# Patient Record
Sex: Male | Born: 1935 | Race: White | Hispanic: No | Smoking: Former smoker
Health system: Southern US, Community
[De-identification: ages and names within clinical notes are randomized; demographics above are authoritative.]

## PROBLEM LIST (undated history)

## (undated) DIAGNOSIS — F32A Depression, unspecified: Secondary | ICD-10-CM

## (undated) DIAGNOSIS — D0362 Melanoma in situ of left upper limb, including shoulder: Secondary | ICD-10-CM

## (undated) DIAGNOSIS — K219 Gastro-esophageal reflux disease without esophagitis: Secondary | ICD-10-CM

## (undated) DIAGNOSIS — C779 Secondary and unspecified malignant neoplasm of lymph node, unspecified: Secondary | ICD-10-CM

## (undated) DIAGNOSIS — J449 Chronic obstructive pulmonary disease, unspecified: Secondary | ICD-10-CM

## (undated) DIAGNOSIS — M1990 Unspecified osteoarthritis, unspecified site: Secondary | ICD-10-CM

## (undated) DIAGNOSIS — F332 Major depressive disorder, recurrent severe without psychotic features: Secondary | ICD-10-CM

## (undated) DIAGNOSIS — H3552 Pigmentary retinal dystrophy: Secondary | ICD-10-CM

## (undated) DIAGNOSIS — F1011 Alcohol abuse, in remission: Secondary | ICD-10-CM

## (undated) DIAGNOSIS — F1120 Opioid dependence, uncomplicated: Secondary | ICD-10-CM

## (undated) DIAGNOSIS — C801 Malignant (primary) neoplasm, unspecified: Secondary | ICD-10-CM

## (undated) DIAGNOSIS — I714 Abdominal aortic aneurysm, without rupture, unspecified: Secondary | ICD-10-CM

## (undated) DIAGNOSIS — J189 Pneumonia, unspecified organism: Secondary | ICD-10-CM

## (undated) DIAGNOSIS — M456 Ankylosing spondylitis lumbar region: Secondary | ICD-10-CM

## (undated) DIAGNOSIS — M4810 Ankylosing hyperostosis [Forestier], site unspecified: Secondary | ICD-10-CM

## (undated) DIAGNOSIS — I10 Essential (primary) hypertension: Secondary | ICD-10-CM

## (undated) DIAGNOSIS — S12100A Unspecified displaced fracture of second cervical vertebra, initial encounter for closed fracture: Secondary | ICD-10-CM

## (undated) DIAGNOSIS — I509 Heart failure, unspecified: Secondary | ICD-10-CM

## (undated) DIAGNOSIS — F419 Anxiety disorder, unspecified: Secondary | ICD-10-CM

## (undated) DIAGNOSIS — Z87891 Personal history of nicotine dependence: Secondary | ICD-10-CM

## (undated) DIAGNOSIS — I5030 Unspecified diastolic (congestive) heart failure: Secondary | ICD-10-CM

## (undated) DIAGNOSIS — I499 Cardiac arrhythmia, unspecified: Secondary | ICD-10-CM

## (undated) DIAGNOSIS — R296 Repeated falls: Secondary | ICD-10-CM

## (undated) DIAGNOSIS — H409 Unspecified glaucoma: Secondary | ICD-10-CM

## (undated) DIAGNOSIS — H903 Sensorineural hearing loss, bilateral: Secondary | ICD-10-CM

## (undated) DIAGNOSIS — C439 Malignant melanoma of skin, unspecified: Secondary | ICD-10-CM

## (undated) DIAGNOSIS — M810 Age-related osteoporosis without current pathological fracture: Secondary | ICD-10-CM

## (undated) HISTORY — DX: Malignant (primary) neoplasm, unspecified: C80.1

## (undated) HISTORY — PX: CHOLECYSTECTOMY: SHX55

## (undated) HISTORY — DX: Chronic obstructive pulmonary disease, unspecified: J44.9

## (undated) HISTORY — PX: COLONOSCOPY: SHX174

## (undated) HISTORY — DX: Unspecified glaucoma: H40.9

## (undated) HISTORY — DX: Anxiety disorder, unspecified: F41.9

## (undated) HISTORY — DX: Age-related osteoporosis without current pathological fracture: M81.0

---

## 2006-06-19 HISTORY — PX: TRANSURETHRAL RESECTION OF PROSTATE: SHX73

## 2016-08-15 LAB — COMPREHENSIVE METABOLIC PANEL
ANA RESULT:: NEGATIVE
CK Total: 31
CRP: 3
Calcium, Corrected: 9.3
ERYTHROCYTE SED RATE: 12
PREALBUMIN: 25
Rheumatoid Fact: <15
Uric Acid: 4.1

## 2016-08-15 LAB — CBC AND DIFFERENTIAL
HCT: 44 (ref 41–53)
Hemoglobin: 13.6 (ref 13.5–17.5)
WBC: 6.1

## 2016-08-15 LAB — HEPATIC FUNCTION PANEL
ALT: 37 (ref 10–40)
AST: 29 (ref 14–40)
Alkaline Phosphatase: 71 (ref 25–125)
Bilirubin, Total: 0.3

## 2016-08-15 LAB — BASIC METABOLIC PANEL
BUN: 24 — AB (ref 4–21)
Creatinine: 1.3 (ref 0.6–1.3)
Glucose: 122
Potassium: 4.7 (ref 3.4–5.3)
Sodium: 142 (ref 137–147)

## 2016-08-15 LAB — VITAMIN B12: Vitamin B-12: 728

## 2016-08-15 LAB — VITAMIN D 25 HYDROXY (VIT D DEFICIENCY, FRACTURES): VIT D 25 HYDROXY: 26

## 2016-08-15 LAB — TSH: TSH: 2.34 (ref 0.41–5.90)

## 2018-05-01 ENCOUNTER — Encounter: Payer: Self-pay | Admitting: Family Medicine

## 2018-05-01 ENCOUNTER — Ambulatory Visit: Payer: Medicare Other | Admitting: Family Medicine

## 2018-05-01 VITALS — BP 142/88 | HR 67 | Temp 98.0°F | Resp 16 | Ht 70.0 in | Wt 243.8 lb

## 2018-05-01 DIAGNOSIS — Z7689 Persons encountering health services in other specified circumstances: Secondary | ICD-10-CM | POA: Diagnosis not present

## 2018-05-01 DIAGNOSIS — M159 Polyosteoarthritis, unspecified: Secondary | ICD-10-CM | POA: Insufficient documentation

## 2018-05-01 DIAGNOSIS — I10 Essential (primary) hypertension: Secondary | ICD-10-CM

## 2018-05-01 DIAGNOSIS — H3552 Pigmentary retinal dystrophy: Secondary | ICD-10-CM | POA: Insufficient documentation

## 2018-05-01 DIAGNOSIS — M4810 Ankylosing hyperostosis [Forestier], site unspecified: Secondary | ICD-10-CM | POA: Diagnosis not present

## 2018-05-01 DIAGNOSIS — M150 Primary generalized (osteo)arthritis: Secondary | ICD-10-CM | POA: Diagnosis not present

## 2018-05-01 DIAGNOSIS — K219 Gastro-esophageal reflux disease without esophagitis: Secondary | ICD-10-CM | POA: Insufficient documentation

## 2018-05-01 DIAGNOSIS — H409 Unspecified glaucoma: Secondary | ICD-10-CM

## 2018-05-01 DIAGNOSIS — T402X5A Adverse effect of other opioids, initial encounter: Secondary | ICD-10-CM

## 2018-05-01 DIAGNOSIS — G894 Chronic pain syndrome: Secondary | ICD-10-CM | POA: Insufficient documentation

## 2018-05-01 DIAGNOSIS — Z1211 Encounter for screening for malignant neoplasm of colon: Secondary | ICD-10-CM

## 2018-05-01 DIAGNOSIS — G8929 Other chronic pain: Secondary | ICD-10-CM | POA: Insufficient documentation

## 2018-05-01 DIAGNOSIS — J432 Centrilobular emphysema: Secondary | ICD-10-CM | POA: Insufficient documentation

## 2018-05-01 DIAGNOSIS — M545 Low back pain: Secondary | ICD-10-CM

## 2018-05-01 DIAGNOSIS — K5520 Angiodysplasia of colon without hemorrhage: Secondary | ICD-10-CM | POA: Insufficient documentation

## 2018-05-01 DIAGNOSIS — M25561 Pain in right knee: Secondary | ICD-10-CM

## 2018-05-01 DIAGNOSIS — M25562 Pain in left knee: Secondary | ICD-10-CM

## 2018-05-01 DIAGNOSIS — K5903 Drug induced constipation: Secondary | ICD-10-CM | POA: Insufficient documentation

## 2018-05-01 NOTE — Assessment & Plan Note (Signed)
Underlying spinal etiology contributing to cartilage loss and degenerative joint disease, primarily affecting lumbar spine, also bilateral knees Previously followed by Ortho and Pain management in [place] Referral to local [facility] to resume chronic opiate therapy if indicated / review procedural interventions as well - given high dose of opiates, I am hesitant to continue at this dose unless recommended by Pain Management or goal to transition his pain care to specialist  Continue Meloxicam 7.5mg  BID, Lyrica, SNRI Effexor

## 2018-05-01 NOTE — Assessment & Plan Note (Signed)
Secondary to chronic opiate use for pain management Improved bowel function on sennakot regimen

## 2018-05-01 NOTE — Assessment & Plan Note (Signed)
Controlled on PPI Continue Pantoprazole 40mg  BID

## 2018-05-01 NOTE — Assessment & Plan Note (Addendum)
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 Previously followed by Ortho and Pain management in [place] Referral to local [facility] to resume chronic opiate therapy if indicated / review procedural interventions as well - given high dose of opiates, I am hesitant to continue at this dose unless recommended by Pain Management or goal to transition his pain care to specialist  Continue Meloxicam 7.5mg  BID, Lyrica, SNRI Effexor  I would most likely agree to bridge his opiate therapy, possibly at slightly lower dose when he is 1 week from running out of medicine, also we may consider contacting his pain doctor in [place] to bridge him to new pain clinic as well.

## 2018-05-01 NOTE — Assessment & Plan Note (Addendum)
Mildly elevated initial BP, repeat manual check improved. - Home BP readings improved   No known complications - request records    Plan:  1. Continue current BP regimen - HCTZ 12.5mg  daily, Amlodipine 10mg  daily 2. Encourage improved lifestyle - low sodium diet, improve activity walking 3. Continue monitor BP outside office, bring readings to next visit, if persistently >140/90 or new symptoms notify office sooner 4. Follow-up within 3 months, review prior records

## 2018-05-01 NOTE — Assessment & Plan Note (Addendum)
Stable without known recurrence Previous cautery procedure from GI >5 yr ago On PPI  Referral to local [facility] for colonoscopy for screening, may discuss this concern as well Reportedly on Lyrica for this AVM

## 2018-05-01 NOTE — Assessment & Plan Note (Signed)
See A&P 

## 2018-05-01 NOTE — Assessment & Plan Note (Signed)
See A&P  Additionally in past had ortho steroid knee injections with 3 month improvement, he was advised to hold off on knee replacement in past 6 months back in [place].  May return for knee injections PRN

## 2018-05-01 NOTE — Assessment & Plan Note (Signed)
Stable currently w/o exacerbation Continues on Anoro daily maintenance  Also has history of recurrent pneumonia, he has been placed on chronic doxycycline 100mg  BID x 10 days every month for past 8 months and has reduced his pneumonia episodes - will refill when ready, offered pulmonology referral he declines at this time.

## 2018-05-01 NOTE — Progress Notes (Addendum)
Subjective:    Patient ID: Aaron Gibbs, male    DOB: Nov 14, 1934, 83 y.o.   MRN: [id number]  Aaron Gibbs is a 83 y.o. male presenting on 05/01/2018 for [place] (moved from [place]); Pain; Arthritis; and COPD  Moved from [place] about 2 weeks ago. Previous PCP Dr Rhona Raider IM ([place]).  HPI  Chronic Pain Syndrome / Low Back Pain / DISH Arthritis - Opioid Induced Constipation - He was previously diagnosed with DISH for arthritis in back. He has tried and failed multiple medications including NSAIDs, Celebrex, Gabapentin. He did spinal epidural injections in past with limited relief. He has been taking opiate pain medication for >15 years, initially started in [place] after failed procedures started on opiates, he then moved to [place] and continued opiates, was seen by [facility] (Dr Laurice Record). He has been on current regimen for approximately 7 years, with long acting opiate in past with Oxycontin, and now switched to Morphine Sulfate ER 15mg  twice daily, and Oxycodone 10/325 TID regularly most days. - Also has knee pain osteoarthritis in both knees with reduced cartilage. He has seen [place] and they considered Knee replacement, and deferred decision at that time. Within past 6 months. - He reviews background history in younger years he did long hours of labor intensive work, and worked as Chief Financial Officer, also had one back injury fall from horse age 72s. He has been retired 28 years. - He currently has a 2 week supply left of his pain medicine,  - Additional treatments - taking Meloxicam 7.5mg  BID, Lyrica reportedly for AVM in bowel, but should also be helping pain, also on Effexor for mood. - he takes Sennakot for bowel regimen to treat constipation - he is requesting referral to local Pain Management provider now. He attempted to call and schedule on his own but was told he needs a referral.  Chronic Depression, recurrent  - in remission He has history of depression for years. He initially started with Effexor, and has been on it long term, has done well with it. - Currently taking Effexor 75mg  daily, doing well without concerns now.  CHRONIC HTN: Reports checks BP occasional. Usually elevated in doctors office. Current Meds - HCTZ 12.5mg  daily, Amlodipine 10mg  daily   Reports good compliance, took meds today. Tolerating well, w/o complaints. Lifestyle: - Diet: no particular diet, not following low sodium - Exercise: limited exercise, rarely walking Denies CP, dyspnea, HA, edema, dizziness / lightheadedness  AVM colon / history of GIB Previously followed by GI specialist at [facility]. They did procedure with colonoscopy and did cautery of AVM due to bleeding. - he states was put on Lyrica for this issue. Also on pantoprazole.  COPD Long term problem. He has rare flare up. Using Anoro inhaler regularly History of chronic recurrent pneumonia in past. Previously Followed by Pulmonology - they advised him to take Doxycycline 100mg  BID for for 10 days out of every month, for past 8 months. Usually gets 3 month supply of rx.  Bilateral Glaucoma / Retinitis Pigmentosa Requesting referral to Local Ophthalmology to establish care locally, continues on eye drops with Betimol 0.25%.  Preferred pharmacy [facility] - maintenance  Health Maintenance: Request PCP record for updates on health maintenance  Last colonoscopy done 6 years ago in [place]. Reportedly no polyps. He is not sure when they told him to return. He is requesting one more colonoscopy done for screening purposes. Request referral now.  Depression screen PHQ 2/9 05/01/2018  Decreased Interest 0  Down, Depressed, Hopeless 0  PHQ - 2 Score 0    Past Medical History:  Diagnosis Date  . Anxiety   . Cancer ([place])   . COPD (chronic obstructive pulmonary disease) ([place])   . Glaucoma   . Osteoporosis    osteopenia   Past  Surgical History:  Procedure Laterality Date  . CHOLECYSTECTOMY     Social History   Socioeconomic History  . Marital status: Divorced    Spouse name: Not on file  . Number of children: Not on file  . Years of education: Not on file  . Highest education level: Not on file  Occupational History  . Not on file  Social Needs  . Financial resource strain: Not on file  . Food insecurity:    Worry: Not on file    Inability: Not on file  . Transportation needs:    Medical: Not on file    Non-medical: Not on file  Tobacco Use  . Smoking status: Former Smoker    Years: 15.00  . Smokeless tobacco: Current User    Types: Chew  Substance and Sexual Activity  . Alcohol use: Not Currently    Comment: past  . Drug use: Never  . Sexual activity: Not on file  Lifestyle  . Physical activity:    Days per week: Not on file    Minutes per session: Not on file  . Stress: Not on file  Relationships  . Social connections:    Talks on phone: Not on file    Gets together: Not on file    Attends religious service: Not on file    Active member of club or organization: Not on file    Attends meetings of clubs or organizations: Not on file    Relationship status: Not on file  . Intimate partner violence:    Fear of current or ex partner: Not on file    Emotionally abused: Not on file    Physically abused: Not on file    Forced sexual activity: Not on file  Other Topics Concern  . Not on file  Social History Narrative  . Not on file   Family History  Problem Relation Age of Onset  . Depression Mother   . Heart disease Father    Current Outpatient Medications on File Prior to Visit  Medication Sig  . amLODipine (NORVASC) 10 MG tablet Take 10 mg by mouth daily.  Marland Kitchen aspirin EC 81 MG tablet Take 81 mg by mouth daily.  Marland Kitchen doxycycline (VIBRAMYCIN) 100 MG capsule Take 100 mg by mouth 2 (two) times daily. For 10 days, every month, chronic pneumonia prophylaxis  . fenofibrate (TRICOR) 145 MG  tablet Take 145 mg by mouth daily.  . ferrous sulfate 325 (65 FE) MG tablet Take 325 mg by mouth daily with breakfast.  . hydrochlorothiazide (MICROZIDE) 12.5 MG capsule Take 12.5 mg by mouth daily.  . meloxicam (MOBIC) 7.5 MG tablet Take 7.5 mg by mouth 2 (two) times daily.  . Multiple Vitamins-Minerals (MULTIVITAL PO) Take by mouth.  . oxyCODONE-acetaminophen (PERCOCET) 10-325 MG tablet Take 1 tablet by mouth 3 (three) times daily.  . pantoprazole (PROTONIX) 40 MG tablet Take 40 mg by mouth 2 (two) times daily.  . pregabalin (LYRICA) 100 MG capsule Take 100 mg by mouth 4 (four) times daily.  Marland Kitchen senna (SENOKOT) 8.6 MG tablet Take 1 tablet by mouth daily.  . timolol (BETIMOL) 0.25 %  ophthalmic solution 1-2 drops 2 (two) times daily.  Marland Kitchen umeclidinium-vilanterol (ANORO ELLIPTA) 62.5-25 MCG/INH AEPB Inhale 1 puff into the lungs daily.  Marland Kitchen venlafaxine (EFFEXOR) 75 MG tablet Take 75 mg by mouth 2 (two) times daily.  Marland Kitchen morphine (MS CONTIN) 15 MG 12 hr tablet Take 1 tablet (15 mg total) by mouth every 12 (twelve) hours.   No current facility-administered medications on file prior to visit.     Review of Systems Per HPI unless specifically indicated above     Objective:    BP (!) 142/88 (BP Location: Left Arm, Cuff Size: Normal)   Pulse 67   Temp 98 F (36.7 C) (Oral)   Resp 16   Ht 5\' 10"  (1.778 m)   Wt 243 lb 12.8 oz (110.6 kg)   SpO2 98%   BMI 34.98 kg/m   Wt Readings from Last 3 Encounters:  05/01/18 243 lb 12.8 oz (110.6 kg)    Physical Exam  Constitutional: He is oriented to person, place, and time. He appears well-developed and well-nourished. No distress.  Chronically ill but currently well appearing 83 yr old male, comfortable, cooperative  HENT:  Head: Normocephalic and atraumatic.  Mouth/Throat: Oropharynx is clear and moist.  Eyes: Conjunctivae are normal. Right eye exhibits no discharge. Left eye exhibits no discharge.  Neck: Normal range of motion. Neck supple.    Cardiovascular: Normal rate, regular rhythm, normal heart sounds and intact distal pulses.  No murmur heard. Pulmonary/Chest: Effort normal and breath sounds normal. No respiratory distress. He has no wheezes. He has no rales.  Musculoskeletal: He exhibits no edema.  Using cane for ambulation.  Able to stand from seated position.  Significant increased thoracic kyphosis on exam.  Generalized muscle hypertonicity bilateral paraspinal back muscles thoracic into lumbar.  Neurological: He is alert and oriented to person, place, and time.  Skin: Skin is warm and dry. No rash noted. He is not diaphoretic. No erythema.  Psychiatric: He has a normal mood and affect. His behavior is normal.  Well groomed, good eye contact, normal speech and thoughts  Nursing note and vitals reviewed.  No results found for this or any previous visit.    Assessment & Plan:   Problem List Items Addressed This Visit    AVM (arteriovenous malformation) of colon    Stable without known recurrence Previous cautery procedure from GI >5 yr ago On PPI  Referral to local [facility] for colonoscopy for screening, may discuss this concern as well Reportedly on Lyrica for this AVM      Relevant Medications   amLODipine (NORVASC) 10 MG tablet   hydrochlorothiazide (MICROZIDE) 12.5 MG capsule   fenofibrate (TRICOR) 145 MG tablet   aspirin EC 81 MG tablet   Other Relevant Orders   Ambulatory referral to Gastroenterology   Bilateral chronic knee pain    See A&P  Additionally in past had ortho steroid knee injections with 3 month improvement, he was advised to hold off on knee replacement in past 6 months back in [place].  May return for knee injections PRN      Relevant Medications   meloxicam (MOBIC) 7.5 MG tablet   oxyCODONE-acetaminophen (PERCOCET) 10-325 MG tablet   pregabalin (LYRICA) 100 MG capsule   venlafaxine (EFFEXOR) 75 MG tablet   aspirin EC 81 MG tablet   morphine (MS CONTIN) 15 MG 12 hr tablet    Other Relevant Orders   Ambulatory referral to Pain Clinic   Centrilobular emphysema ([place])    Stable currently  w/o exacerbation Continues on Anoro daily maintenance  Also has history of recurrent pneumonia, he has been placed on chronic doxycycline 100mg  BID x 10 days every month for past 8 months and has reduced his pneumonia episodes - will refill when ready, offered pulmonology referral he declines at this time.      Relevant Medications   umeclidinium-vilanterol (ANORO ELLIPTA) 62.5-25 MCG/INH AEPB   Chronic low back pain   Relevant Medications   meloxicam (MOBIC) 7.5 MG tablet   oxyCODONE-acetaminophen (PERCOCET) 10-325 MG tablet   aspirin EC 81 MG tablet   morphine (MS CONTIN) 15 MG 12 hr tablet   Other Relevant Orders   Ambulatory referral to Pain Clinic   Chronic pain syndrome - Primary    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 Previously followed by Ortho and Pain management in [place] Referral to local [place] [facility] to resume chronic opiate therapy if indicated / review procedural interventions as well - given high dose of opiates, I am hesitant to continue at this dose unless recommended by Pain Management or goal to transition his pain care to specialist  Continue Meloxicam 7.5mg  BID, Lyrica, SNRI Effexor  I would most likely agree to bridge his opiate therapy, possibly at slightly lower dose when he is 1 week from running out of medicine, also we may consider contacting his pain doctor in [place] to bridge him to new pain clinic as well.      Relevant Medications   morphine (MS CONTIN) 15 MG 12 hr tablet   Other Relevant Orders   Ambulatory referral to Pain Clinic   DISH (diffuse idiopathic skeletal hyperostosis)    Underlying spinal etiology contributing to cartilage loss and degenerative joint disease, primarily affecting lumbar spine, also bilateral knees Previously followed by Ortho and Pain  management in [place] Referral to local [place] [facility] to resume chronic opiate therapy if indicated / review procedural interventions as well - given high dose of opiates, I am hesitant to continue at this dose unless recommended by Pain Management or goal to transition his pain care to specialist  Continue Meloxicam 7.5mg  BID, Lyrica, SNRI Effexor      Relevant Medications   morphine (MS CONTIN) 15 MG 12 hr tablet   Other Relevant Orders   Ambulatory referral to Pain Clinic   Essential hypertension    Mildly elevated initial BP, repeat manual check improved. - Home BP readings improved   No known complications - request records    Plan:  1. Continue current BP regimen - HCTZ 12.5mg  daily, Amlodipine 10mg  daily 2. Encourage improved lifestyle - low sodium diet, improve activity walking 3. Continue monitor BP outside office, bring readings to next visit, if persistently >140/90 or new symptoms notify office sooner 4. Follow-up within 3 months, review prior records       Relevant Medications   amLODipine (NORVASC) 10 MG tablet   hydrochlorothiazide (MICROZIDE) 12.5 MG capsule   fenofibrate (TRICOR) 145 MG tablet   aspirin EC 81 MG tablet   GERD (gastroesophageal reflux disease)    Controlled on PPI Continue Pantoprazole 40mg  BID      Relevant Medications   pantoprazole (PROTONIX) 40 MG tablet   senna (SENOKOT) 8.6 MG tablet   Glaucoma of both eyes   Relevant Medications   timolol (BETIMOL) 0.25 % ophthalmic solution   Other Relevant Orders   Ambulatory referral to Ophthalmology   Osteoarthritis of multiple joints  See A&P      Relevant Medications   meloxicam (MOBIC) 7.5 MG tablet   oxyCODONE-acetaminophen (PERCOCET) 10-325 MG tablet   aspirin EC 81 MG tablet   morphine (MS CONTIN) 15 MG 12 hr tablet   Other Relevant Orders   Ambulatory referral to Pain Clinic   Retinitis pigmentosa of both eyes   Relevant Orders   Ambulatory  referral to Ophthalmology   Therapeutic opioid-induced constipation (OIC)    Secondary to chronic opiate use for pain management Improved bowel function on sennakot regimen      Relevant Orders   Ambulatory referral to Gastroenterology    Other Visit Diagnoses    Encounter to establish care with new doctor       Screen for colon cancer       Relevant Orders   Ambulatory referral to Gastroenterology      No orders of the defined types were placed in this encounter.  Orders Placed This Encounter  Procedures  . Ambulatory referral to Ophthalmology    Referral Priority:   Routine    Referral Type:   Consultation    Referral Reason:   Specialty Services Required    Referred to Provider:   Estill Cotta, MD    Requested Specialty:   Ophthalmology    Number of Visits Requested:   1  . Ambulatory referral to Gastroenterology    Referral Priority:   Routine    Referral Type:   Consultation    Referral Reason:   Specialty Services Required    Number of Visits Requested:   1  . Ambulatory referral to Pain Clinic    Referral Priority:   Routine    Referral Type:   Consultation    Referral Reason:   Specialty Services Required    Requested Specialty:   Pain Medicine    Number of Visits Requested:   1    Follow up plan: Return in about 3 months (around 08/01/2018) for Follow-up chronic pain / referrals (pain, ophtho, GI), med refills, HTN.  [name], [facility] 05/01/2018, 5:49 PM

## 2018-05-01 NOTE — Patient Instructions (Addendum)
Thank you for coming to the office today.  Referral to Pain Management - stay tuned for apt - if you have not heard by next week Monday, then call them to check status of this, and notify me if you are about to run out of pain medicine at least 1 week before.  [facility] Address: [address], [place], [place] [address] Phone: [phone]  -------------------------------------------------------  Referral sent to   [facility] [address] [place], [place] [address] Phone: [phone] [email or web address]  Stay tuned for appointment - they may need to get records as well  ---- Stay tuned for colonoscopy  [facility]) [address] [place], [place] [address] Phone: [phone]  Please schedule a Follow-up Appointment to: Return in about 3 months (around 08/01/2018) for Follow-up chronic pain / referrals (pain, ophtho, GI), med refills, HTN.  If you have any other questions or concerns, please feel free to call the office or send a message through [place]. You may also schedule an earlier appointment if necessary.  Additionally, you may be receiving a survey about your experience at our office within a few days to 1 week by e-mail or mail. We value your feedback.  [name], DO [place]

## 2018-05-06 ENCOUNTER — Encounter: Payer: Self-pay | Admitting: *Deleted

## 2018-05-09 ENCOUNTER — Emergency Department
Admission: EM | Admit: 2018-05-09 | Discharge: 2018-05-09 | Disposition: A | Discharge status: Home / Self Care | Payer: Medicare Other | Attending: Emergency Medicine | Admitting: Emergency Medicine

## 2018-05-09 ENCOUNTER — Other Ambulatory Visit: Payer: Self-pay

## 2018-05-09 ENCOUNTER — Emergency Department: Payer: Medicare Other

## 2018-05-09 DIAGNOSIS — Z7982 Long term (current) use of aspirin: Secondary | ICD-10-CM | POA: Diagnosis not present

## 2018-05-09 DIAGNOSIS — R079 Chest pain, unspecified: Secondary | ICD-10-CM | POA: Insufficient documentation

## 2018-05-09 DIAGNOSIS — R109 Unspecified abdominal pain: Secondary | ICD-10-CM

## 2018-05-09 DIAGNOSIS — F17220 Nicotine dependence, chewing tobacco, uncomplicated: Secondary | ICD-10-CM | POA: Insufficient documentation

## 2018-05-09 DIAGNOSIS — Z79899 Other long term (current) drug therapy: Secondary | ICD-10-CM | POA: Diagnosis not present

## 2018-05-09 DIAGNOSIS — K5900 Constipation, unspecified: Secondary | ICD-10-CM | POA: Insufficient documentation

## 2018-05-09 DIAGNOSIS — J449 Chronic obstructive pulmonary disease, unspecified: Secondary | ICD-10-CM | POA: Diagnosis not present

## 2018-05-09 DIAGNOSIS — R1084 Generalized abdominal pain: Secondary | ICD-10-CM | POA: Diagnosis present

## 2018-05-09 LAB — COMPREHENSIVE METABOLIC PANEL
ALK PHOS: 45 U/L (ref 38–126)
ALT: 19 U/L (ref 0–44)
AST: 23 U/L (ref 15–41)
Albumin: 4 g/dL (ref 3.5–5.0)
Anion gap: 7 (ref 5–15)
BILIRUBIN TOTAL: 0.5 mg/dL (ref 0.3–1.2)
BUN: 21 mg/dL (ref 8–23)
CALCIUM: 9.4 mg/dL (ref 8.9–10.3)
CO2: 27 mmol/L (ref 22–32)
CREATININE: 0.83 mg/dL (ref 0.61–1.24)
Chloride: 107 mmol/L (ref 98–111)
GFR calc Af Amer: >60 mL/min (ref >60)
Glucose, Bld: 125 mg/dL — ABNORMAL HIGH (ref 70–99)
Potassium: 4 mmol/L (ref 3.5–5.1)
Sodium: 141 mmol/L (ref 135–145)
TOTAL PROTEIN: 6.9 g/dL (ref 6.5–8.1)

## 2018-05-09 LAB — CBC
HCT: 45.8 % (ref 39.0–52.0)
HEMOGLOBIN: 14.7 g/dL (ref 13.0–17.0)
MCH: 29.1 pg (ref 26.0–34.0)
MCHC: 32.1 g/dL (ref 30.0–36.0)
MCV: 90.7 fL (ref 80.0–100.0)
PLATELETS: 200 10*3/uL (ref 150–400)
RBC: 5.05 MIL/uL (ref 4.22–5.81)
RDW: 14.9 % (ref 11.5–15.5)
WBC: 8.2 10*3/uL (ref 4.0–10.5)
nRBC: 0 % (ref 0.0–0.2)

## 2018-05-09 LAB — LIPASE, BLOOD: Lipase: 22 U/L (ref 11–51)

## 2018-05-09 LAB — TROPONIN I

## 2018-05-09 MED ORDER — KETOROLAC TROMETHAMINE 60 MG/2ML IM SOLN
30.0000 mg | Freq: Once | INTRAMUSCULAR | Status: AC
  Administered 2018-05-09: 30 mg via INTRAMUSCULAR
  Filled 2018-05-09: qty 2

## 2018-05-09 MED ORDER — POLYETHYLENE GLYCOL 3350 17 GM/SCOOP PO POWD: 17.0000 g | Freq: Every day | ORAL | 0 refills | Status: DC | PRN

## 2018-05-09 NOTE — ED Notes (Signed)
Holding pt for 20 min for medication.

## 2018-05-09 NOTE — Discharge Instructions (Signed)
As we discussed please take MiraLAX 17 g 3 times daily with a large glass of water until you have several good bowel movements and then you may back off to 1 times daily.  Please continue to take your Colace and Senokot on a daily basis.  Return to the emergency department for any worsening or increase of abdominal pain, any worsening chest pain trouble breathing, or any other symptom personally concerning to yourself.  Otherwise please follow-up with your primary care doctor tomorrow for recheck/reevaluation.

## 2018-05-09 NOTE — ED Triage Notes (Addendum)
Pt comes via POV from home with c/o [facility] and abdominal pain. Pt states this started this am. Pt states chills and generalized abdominal pain. Pt states 8/10 pain at this time. Pt denies any cough, and nausea. Family reports 2 episodes of vomiting.  Pt is alert and oriented. Pt appears in NAD at this time

## 2018-05-09 NOTE — ED Provider Notes (Signed)
[facility] Emergency Department Provider Note  Time seen: 9:37 PM  I have reviewed the triage vital signs and the nursing notes.   HISTORY  Chief Complaint Shortness of Breath and Abdominal Pain    HPI Aaron Gibbs is a 83 y.o. male with a past medical history anxiety, cancer, COPD, hypertension, presents to the emergency department for abdominal pain as well as chest pain.  According to the patient since this morning he has been experiencing abdominal pain, points to his mid abdomen when asked where it hurts.  States he is occasionally had chest discomfort as well points to his epigastrium and some mild shortness of breath today.  States nausea but denies any vomiting.  Denies any diaphoresis.  There is a normal bowel movement yesterday.  Daughter states patient has a strong history of constipation and has required coming to the ER in the past for abdominal pain due to constipation.  Denies any fever at home.  No dysuria or hematuria.   Past Medical History:  Diagnosis Date  . Anxiety   . Cancer ([place])   . COPD (chronic obstructive pulmonary disease) ([place])   . Glaucoma   . Osteoporosis    osteopenia    Patient Active Problem List   Diagnosis Date Noted  . Chronic pain syndrome 05/01/2018  . DISH (diffuse idiopathic skeletal hyperostosis) 05/01/2018  . Osteoarthritis of multiple joints 05/01/2018  . Bilateral chronic knee pain 05/01/2018  . Retinitis pigmentosa of both eyes 05/01/2018  . Glaucoma of both eyes 05/01/2018  . Essential hypertension 05/01/2018  . Chronic low back pain 05/01/2018  . GERD (gastroesophageal reflux disease) 05/01/2018  . 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 Therapeutic opioid-induced constipation (OIC) 05/01/2018  . Centrilobular emphysema ([place]) 05/01/2018    Past Surgical History:  Procedure Laterality Date  . CHOLECYSTECTOMY      Prior to Admission medications   Medication Sig Start Date End  Date Taking? Authorizing Provider  amLODipine (NORVASC) 10 MG tablet Take 10 mg by mouth daily.    [provider]  aspirin EC 81 MG tablet Take 81 mg by mouth daily.    [provider]  doxycycline (VIBRAMYCIN) 100 MG capsule Take 100 mg by mouth 2 (two) times daily. For 10 days, every month, chronic pneumonia prophylaxis    [provider]  fenofibrate (TRICOR) 145 MG tablet Take 145 mg by mouth daily.    [provider]  ferrous sulfate 325 (65 FE) MG tablet Take 325 mg by mouth daily with breakfast.    [provider]  hydrochlorothiazide (MICROZIDE) 12.5 MG capsule Take 12.5 mg by mouth daily.    [provider]  meloxicam (MOBIC) 7.5 MG tablet Take 7.5 mg by mouth 2 (two) times daily.    [provider]  morphine (MS CONTIN) 15 MG 12 hr tablet Take 1 tablet (15 mg total) by mouth every 12 (twelve) hours. 05/01/18   Karamalegos, Devonne Doughty, DO  Multiple Vitamins-Minerals (MULTIVITAL PO) Take by mouth.    [provider]  oxyCODONE-acetaminophen (PERCOCET) 10-325 MG tablet Take 1 tablet by mouth 3 (three) times daily.    [provider]  pantoprazole (PROTONIX) 40 MG tablet Take 40 mg by mouth 2 (two) times daily.    [provider]  pregabalin (LYRICA) 100 MG capsule Take 100 mg by mouth 4 (four) times daily.    [provider]  senna (SENOKOT) 8.6 MG tablet Take 1 tablet by mouth daily.  [provider]  timolol (BETIMOL) 0.25 % ophthalmic solution 1-2 drops 2 (two) times daily.    [provider]  umeclidinium-vilanterol (ANORO ELLIPTA) 62.5-25 MCG/INH AEPB Inhale 1 puff into the lungs daily.    [provider]  venlafaxine (EFFEXOR) 75 MG tablet Take 75 mg by mouth 2 (two) times daily.    [provider]    Allergies  Allergen Reactions  . Azithromycin Shortness Of Breath    Family History  Problem Relation Age of Onset  . Depression Mother   .  Heart disease Father     Social History Social History   Tobacco Use  . Smoking status: Former Smoker    Years: 15.00  . Smokeless tobacco: Current User    Types: Chew  Substance Use Topics  . Alcohol use: Not Currently    Comment: past  . Drug use: Never    Review of Systems Constitutional: Negative for fever. Cardiovascular: Negative for chest pain. Respiratory: Negative for shortness of breath. Gastrointestinal: Positive for mid abdominal pain.  Positive for nausea negative for vomiting.  Normal bowel movement yesterday. Genitourinary: Negative for urinary compaints Musculoskeletal: Negative for musculoskeletal complaints Skin: Negative for skin complaints  Neurological: Negative for headache All other ROS negative  ____________________________________________   PHYSICAL EXAM:  VITAL SIGNS: ED Triage Vitals  Enc Vitals Group     BP 05/09/18 1734 (!) 142/72     Pulse Rate 05/09/18 1734 81     Resp 05/09/18 1734 19     Temp 05/09/18 1734 98.2 F (36.8 C)     Temp src --      SpO2 05/09/18 1734 95 %     Weight 05/09/18 1731 243 lb 12.8 oz (110.6 kg)     Height 05/09/18 1731 5\' 10"  (1.778 m)     Head Circumference --      Peak Flow --      Pain Score 05/09/18 1731 8     Pain Loc --      Pain Edu? --      Excl. in [place]? --    Constitutional: Alert and oriented. Well appearing and in no distress. Eyes: Normal exam ENT   Head: Normocephalic and atraumatic.   Mouth/Throat: Mucous membranes are moist. Cardiovascular: Normal rate, regular rhythm. No murmur Respiratory: Normal respiratory effort without tachypnea nor retractions. Breath sounds are clear Gastrointestinal: Soft, obese, mild periumbilical and left lower quadrant tenderness, no rebound guarding or distention. Musculoskeletal: Nontender with normal range of motion in all extremities.  Neurologic:  Normal speech and language. No gross focal neurologic deficits Skin:  Skin is warm, dry and intact.   Psychiatric: Mood and affect are normal.  ____________________________________________    EKG  EKG reviewed and interpreted by myself shows a normal sinus rhythm 84 bpm with a narrow QRS, normal axis, normal intervals, no concerning ST changes.  ____________________________________________    RADIOLOGY  Chest x-ray negative  ____________________________________________   INITIAL IMPRESSION / ASSESSMENT AND PLAN / ED COURSE  Pertinent labs & imaging results that were available during my care of the patient were reviewed by me and considered in my medical decision making (see chart for details).  Patient presents to the emergency department for abdominal pain and chest pain beginning this morning.  The chest pain appears to be more epigastric abdominal pain.  Daughter states he has presented very similarly in the past with constipation although the patient states he had a bowel movement yesterday.  Differential is quite broad  would include infectious etiology, metabolic or electrolyte abnormality, ACS, constipation, urinary tract infection.  Patient's labs are largely within normal limits, urinalysis pending.  Reassuringly troponin is negative and white blood cell count is negative.  We will obtain CT imaging of the abdomen as a precaution.  EKG shows no concerning findings at this time a chest x-ray is clear.  Patient's work-up is essentially negative.  CT scan does show moderate constipation.  Patient takes chronic opioid treatments, very likely causing his constipation.  Already takes Colace and Senokot daily.  I discussed with the patient and daughter starting MiraLAX 3 times daily until the patient has several good bowel movements on a backing off to 1 times daily.  They are agreeable to this plan of care.  Patient was not able to provide a urinalysis sample although his kidney function is normal, patient states he is not having any urinary symptoms and does not wish to wait for a  urine sample.  CT scan did show a AAA, which I believe is completely incidental.  I discussed its over the patient and he is aware of the AAA, nonetheless we have recommended repeat imaging in 1 year.  ____________________________________________   FINAL CLINICAL IMPRESSION(S) / ED DIAGNOSES  Abdominal pain Chest pain Constipation   Harvest Dark, MD 05/09/18 2245

## 2018-05-10 ENCOUNTER — Encounter: Payer: Self-pay | Admitting: Emergency Medicine

## 2018-05-10 ENCOUNTER — Emergency Department: Payer: Medicare Other

## 2018-05-10 ENCOUNTER — Other Ambulatory Visit: Payer: Self-pay

## 2018-05-10 ENCOUNTER — Emergency Department
Admission: EM | Admit: 2018-05-10 | Discharge: 2018-05-13 | Disposition: A | Discharge status: Other Acute Inpatient Hospital | Payer: Medicare Other | Attending: Emergency Medicine | Admitting: Emergency Medicine

## 2018-05-10 DIAGNOSIS — H3552 Pigmentary retinal dystrophy: Secondary | ICD-10-CM

## 2018-05-10 DIAGNOSIS — R4182 Altered mental status, unspecified: Secondary | ICD-10-CM | POA: Diagnosis not present

## 2018-05-10 DIAGNOSIS — Z7982 Long term (current) use of aspirin: Secondary | ICD-10-CM | POA: Insufficient documentation

## 2018-05-10 DIAGNOSIS — Z87891 Personal history of nicotine dependence: Secondary | ICD-10-CM | POA: Insufficient documentation

## 2018-05-10 DIAGNOSIS — Z046 Encounter for general psychiatric examination, requested by authority: Secondary | ICD-10-CM | POA: Diagnosis not present

## 2018-05-10 DIAGNOSIS — T1491XA Suicide attempt, initial encounter: Secondary | ICD-10-CM

## 2018-05-10 DIAGNOSIS — R1084 Generalized abdominal pain: Secondary | ICD-10-CM | POA: Insufficient documentation

## 2018-05-10 DIAGNOSIS — R45851 Suicidal ideations: Secondary | ICD-10-CM | POA: Diagnosis not present

## 2018-05-10 DIAGNOSIS — F322 Major depressive disorder, single episode, severe without psychotic features: Secondary | ICD-10-CM

## 2018-05-10 DIAGNOSIS — Z79899 Other long term (current) drug therapy: Secondary | ICD-10-CM | POA: Insufficient documentation

## 2018-05-10 DIAGNOSIS — J449 Chronic obstructive pulmonary disease, unspecified: Secondary | ICD-10-CM | POA: Diagnosis not present

## 2018-05-10 DIAGNOSIS — Z8679 Personal history of other diseases of the circulatory system: Secondary | ICD-10-CM

## 2018-05-10 DIAGNOSIS — I714 Abdominal aortic aneurysm, without rupture: Secondary | ICD-10-CM | POA: Diagnosis not present

## 2018-05-10 DIAGNOSIS — F329 Major depressive disorder, single episode, unspecified: Secondary | ICD-10-CM | POA: Insufficient documentation

## 2018-05-10 DIAGNOSIS — G894 Chronic pain syndrome: Secondary | ICD-10-CM | POA: Diagnosis present

## 2018-05-10 DIAGNOSIS — H547 Unspecified visual loss: Secondary | ICD-10-CM

## 2018-05-10 DIAGNOSIS — I10 Essential (primary) hypertension: Secondary | ICD-10-CM | POA: Insufficient documentation

## 2018-05-10 LAB — COMPREHENSIVE METABOLIC PANEL
ALBUMIN: 4.2 g/dL (ref 3.5–5.0)
ALT: 20 U/L (ref 0–44)
ANION GAP: 11 (ref 5–15)
AST: 25 U/L (ref 15–41)
Alkaline Phosphatase: 46 U/L (ref 38–126)
BILIRUBIN TOTAL: 0.8 mg/dL (ref 0.3–1.2)
BUN: 23 mg/dL (ref 8–23)
CO2: 26 mmol/L (ref 22–32)
Calcium: 9.8 mg/dL (ref 8.9–10.3)
Chloride: 107 mmol/L (ref 98–111)
Creatinine, Ser: 0.98 mg/dL (ref 0.61–1.24)
GFR calc non Af Amer: >60 mL/min (ref >60)
Glucose, Bld: 133 mg/dL — ABNORMAL HIGH (ref 70–99)
POTASSIUM: 3.3 mmol/L — AB (ref 3.5–5.1)
Sodium: 144 mmol/L (ref 135–145)
TOTAL PROTEIN: 7.2 g/dL (ref 6.5–8.1)

## 2018-05-10 LAB — CBC WITH DIFFERENTIAL/PLATELET
Abs Immature Granulocytes: 0.03 10*3/uL (ref 0.00–0.07)
BASOS ABS: 0 10*3/uL (ref 0.0–0.1)
Basophils Relative: 1 %
EOS ABS: 0.4 10*3/uL (ref 0.0–0.5)
EOS PCT: 5 %
HCT: 46.3 % (ref 39.0–52.0)
Hemoglobin: 15 g/dL (ref 13.0–17.0)
Immature Granulocytes: 0 %
Lymphocytes Relative: 14 %
Lymphs Abs: 1.1 10*3/uL (ref 0.7–4.0)
MCH: 29.1 pg (ref 26.0–34.0)
MCHC: 32.4 g/dL (ref 30.0–36.0)
MCV: 89.9 fL (ref 80.0–100.0)
Monocytes Absolute: 0.8 10*3/uL (ref 0.1–1.0)
Monocytes Relative: 10 %
NRBC: 0 % (ref 0.0–0.2)
Neutro Abs: 5.5 10*3/uL (ref 1.7–7.7)
Neutrophils Relative %: 70 %
Platelets: 199 10*3/uL (ref 150–400)
RBC: 5.15 MIL/uL (ref 4.22–5.81)
RDW: 14.9 % (ref 11.5–15.5)
WBC: 7.9 10*3/uL (ref 4.0–10.5)

## 2018-05-10 LAB — TROPONIN I

## 2018-05-10 LAB — ACETAMINOPHEN LEVEL: Acetaminophen (Tylenol), Serum: <10 ug/mL — ABNORMAL LOW (ref 10–30)

## 2018-05-10 LAB — LACTIC ACID, PLASMA: Lactic Acid, Venous: 1.2 mmol/L (ref 0.5–1.9)

## 2018-05-10 LAB — ETHANOL

## 2018-05-10 LAB — LIPASE, BLOOD: LIPASE: 19 U/L (ref 11–51)

## 2018-05-10 LAB — SALICYLATE LEVEL

## 2018-05-10 MED ORDER — OXYCODONE-ACETAMINOPHEN 10-325 MG PO TABS: 1.0000 | ORAL_TABLET | Freq: Three times a day (TID) | ORAL | Status: DC

## 2018-05-10 MED ORDER — TIMOLOL MALEATE 0.25 % OP SOLN
1.0000 [drp] | Freq: Two times a day (BID) | OPHTHALMIC | Status: DC
  Administered 2018-05-10 – 2018-05-13 (×6): 1 [drp] via OPHTHALMIC
  Filled 2018-05-10 (×2): qty 5

## 2018-05-10 MED ORDER — DOXYCYCLINE HYCLATE 100 MG PO TABS
100.0000 mg | ORAL_TABLET | Freq: Two times a day (BID) | ORAL | Status: DC
  Administered 2018-05-10 – 2018-05-13 (×7): 100 mg via ORAL
  Filled 2018-05-10 (×7): qty 1

## 2018-05-10 MED ORDER — SENNA 8.6 MG PO TABS
1.0000 | ORAL_TABLET | Freq: Every day | ORAL | Status: DC
  Administered 2018-05-10 – 2018-05-13 (×4): 8.6 mg via ORAL
  Filled 2018-05-10 (×4): qty 1

## 2018-05-10 MED ORDER — PANTOPRAZOLE SODIUM 40 MG PO TBEC
40.0000 mg | DELAYED_RELEASE_TABLET | Freq: Two times a day (BID) | ORAL | Status: DC
  Administered 2018-05-10 – 2018-05-13 (×7): 40 mg via ORAL
  Filled 2018-05-10 (×7): qty 1

## 2018-05-10 MED ORDER — IOPAMIDOL (ISOVUE-370) INJECTION 76%
100.0000 mL | Freq: Once | INTRAVENOUS | Status: AC | PRN
  Administered 2018-05-10: 100 mL via INTRAVENOUS

## 2018-05-10 MED ORDER — PREGABALIN 50 MG PO CAPS
100.0000 mg | ORAL_CAPSULE | Freq: Four times a day (QID) | ORAL | Status: DC
  Administered 2018-05-10 – 2018-05-13 (×11): 100 mg via ORAL
  Filled 2018-05-10 (×11): qty 2

## 2018-05-10 MED ORDER — UMECLIDINIUM-VILANTEROL 62.5-25 MCG/INH IN AEPB
1.0000 | INHALATION_SPRAY | Freq: Every day | RESPIRATORY_TRACT | Status: DC
  Administered 2018-05-11 – 2018-05-13 (×3): 1 via RESPIRATORY_TRACT
  Filled 2018-05-10 (×2): qty 14

## 2018-05-10 MED ORDER — OXYCODONE HCL 5 MG PO TABS
5.0000 mg | ORAL_TABLET | Freq: Three times a day (TID) | ORAL | Status: DC
  Administered 2018-05-10 – 2018-05-13 (×8): 5 mg via ORAL
  Filled 2018-05-10 (×8): qty 1

## 2018-05-10 MED ORDER — HYDROCHLOROTHIAZIDE 12.5 MG PO CAPS
12.5000 mg | ORAL_CAPSULE | Freq: Every day | ORAL | Status: DC
  Administered 2018-05-10 – 2018-05-13 (×4): 12.5 mg via ORAL
  Filled 2018-05-10 (×4): qty 1

## 2018-05-10 MED ORDER — METHYLNALTREXONE BROMIDE 12 MG/0.6ML ~~LOC~~ SOLN
12.0000 mg | Freq: Once | SUBCUTANEOUS | Status: AC
  Administered 2018-05-10: 12 mg via SUBCUTANEOUS
  Filled 2018-05-10: qty 0.6

## 2018-05-10 MED ORDER — LORAZEPAM 1 MG PO TABS
ORAL_TABLET | ORAL | Status: AC
  Administered 2018-05-10: 1 mg
  Filled 2018-05-10: qty 1

## 2018-05-10 MED ORDER — LORAZEPAM 1 MG PO TABS
1.0000 mg | ORAL_TABLET | Freq: Once | ORAL | Status: AC
  Administered 2018-05-10: 1 mg via ORAL

## 2018-05-10 MED ORDER — POLYETHYLENE GLYCOL 3350 17 G PO PACK
17.0000 g | PACK | Freq: Every day | ORAL | Status: DC | PRN
  Administered 2018-05-11 – 2018-05-12 (×2): 17 g via ORAL
  Filled 2018-05-10 (×2): qty 1

## 2018-05-10 MED ORDER — FERROUS SULFATE 325 (65 FE) MG PO TABS
325.0000 mg | ORAL_TABLET | Freq: Every day | ORAL | Status: DC
  Administered 2018-05-11 – 2018-05-13 (×3): 325 mg via ORAL
  Filled 2018-05-10 (×3): qty 1

## 2018-05-10 MED ORDER — POLYETHYLENE GLYCOL 3350 17 GM/SCOOP PO POWD
17.0000 g | Freq: Every day | ORAL | Status: DC | PRN
  Filled 2018-05-10: qty 255

## 2018-05-10 MED ORDER — OXYCODONE-ACETAMINOPHEN 5-325 MG PO TABS
1.0000 | ORAL_TABLET | Freq: Three times a day (TID) | ORAL | Status: DC
  Administered 2018-05-10 – 2018-05-13 (×8): 1 via ORAL
  Filled 2018-05-10 (×8): qty 1

## 2018-05-10 MED ORDER — MELOXICAM 7.5 MG PO TABS
7.5000 mg | ORAL_TABLET | Freq: Two times a day (BID) | ORAL | Status: DC
  Administered 2018-05-10 – 2018-05-13 (×6): 7.5 mg via ORAL
  Filled 2018-05-10 (×8): qty 1

## 2018-05-10 MED ORDER — VENLAFAXINE HCL 37.5 MG PO TABS
75.0000 mg | ORAL_TABLET | Freq: Two times a day (BID) | ORAL | Status: DC
  Administered 2018-05-10 – 2018-05-13 (×6): 75 mg via ORAL
  Filled 2018-05-10 (×8): qty 2

## 2018-05-10 MED ORDER — ADULT MULTIVITAMIN W/MINERALS CH
1.0000 | ORAL_TABLET | Freq: Every day | ORAL | Status: DC
  Administered 2018-05-10 – 2018-05-13 (×4): 1 via ORAL
  Filled 2018-05-10 (×4): qty 1

## 2018-05-10 MED ORDER — FENOFIBRATE 160 MG PO TABS
160.0000 mg | ORAL_TABLET | Freq: Every day | ORAL | Status: DC
  Administered 2018-05-11 – 2018-05-13 (×3): 160 mg via ORAL
  Filled 2018-05-10 (×4): qty 1

## 2018-05-10 MED ORDER — AMLODIPINE BESYLATE 5 MG PO TABS
10.0000 mg | ORAL_TABLET | Freq: Every day | ORAL | Status: DC
  Administered 2018-05-10 – 2018-05-13 (×4): 10 mg via ORAL
  Filled 2018-05-10 (×4): qty 2

## 2018-05-10 MED ORDER — LORAZEPAM 2 MG/ML IJ SOLN
1.0000 mg | Freq: Once | INTRAMUSCULAR | Status: AC
  Administered 2018-05-10: 1 mg via INTRAVENOUS
  Filled 2018-05-10: qty 1

## 2018-05-10 MED ORDER — ASPIRIN EC 81 MG PO TBEC
81.0000 mg | DELAYED_RELEASE_TABLET | Freq: Every day | ORAL | Status: DC
  Administered 2018-05-10 – 2018-05-13 (×4): 81 mg via ORAL
  Filled 2018-05-10 (×4): qty 1

## 2018-05-10 NOTE — ED Provider Notes (Signed)
IMPRESSION: 1. No aortic dissection or acute abnormality. 2. Mild-to-moderate lingular atelectasis. 3. Stable 4.2 cm infrarenal abdominal aortic aneurysm. Recommend followup by ultrasound in 1 year. This recommendation follows ACR consensus guidelines: White Paper of the ACR Incidental Findings Committee II on Vascular Findings. J Am Coll Radiol 2013; 10:789-794. 4. Stable changes of ankylosing spondylitis. 5. 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 6. Sigmoid diverticulosis. 7. Diffuse hepatic steatosis.  CT dissection protocol as dictated above.  Patient appears medically clear for psychiatric evaluation.    Earleen Newport, MD 05/10/18 [phone]

## 2018-05-10 NOTE — ED Notes (Signed)
Gave pt some more juice to drink.

## 2018-05-10 NOTE — ED Notes (Signed)
Pt tells me that he is having knee pain and requests pain medication.  Pt states that he takes oxycodone at home for pain.  Will let EDP know

## 2018-05-10 NOTE — ED Triage Notes (Signed)
Patient brought in by [facility] for IVC. Per officer the patient had a hand gun and states that he was having thoughts of hurting himself. Patient states that he is having pain all over and that he was seen here earlier and we did not help with the pain.

## 2018-05-10 NOTE — ED Notes (Signed)
Pt transferred into room 23    Patient assigned to appropriate care area.  Plan of care discussed including pending psychiatric evaluation   Visiting hours and phone times explained to patient. Patient verbalizes understanding, and verbal contract for safety obtained.

## 2018-05-10 NOTE — ED Notes (Signed)
Move pt from room 25 to room 23 Per Rn Amy. Pt is in bed and is trying to sleep.

## 2018-05-10 NOTE — ED Notes (Signed)
CT tech notified nurse that pt was not cooperative for scan. Pt was unable to hold still.

## 2018-05-10 NOTE — ED Notes (Signed)
BEHAVIORAL HEALTH ROUNDING Patient sleeping: No. Patient alert : yes Behavior appropriate: Yes.  ; If no, describe:  Nutrition and fluids offered: yes Toileting and hygiene offered: Yes  Sitter present: q15 minute observations and security monitoring Law enforcement present: Yes  ODS  

## 2018-05-10 NOTE — ED Notes (Signed)
Pt is getting out of bed again we assisted back to the bed.

## 2018-05-10 NOTE — ED Notes (Signed)
Pt got out of bed and walked to hall by himself. Walked pt back to bed.

## 2018-05-10 NOTE — ED Notes (Signed)
Patient transported to CT 

## 2018-05-10 NOTE — ED Notes (Signed)
Pt was taken to the restroom again, but could not go. Walked pt back to bed.

## 2018-05-10 NOTE — BH Assessment (Signed)
Per Dr. Weber Cooks pt meets criteria for inpatient treatment. Pt's referral information has been faxed to the following facilities for review:  [facility]   [place], [place] [address] Phone: [phone] Fax: [facility] [address], [place] [address] Phone: [phone] Fax: [place]   [place], [address] [place] phone: [phone] Fax: [phone]  [facility] Phone: [phone] Fax: [employer]    [address] [address] Phone: [phone] Fax: [place]   [address] [place] [address] Phone: [phone] Fax: [phone]  [facility] [address] [place] [address] Phone: [phone] Fax: [phone]

## 2018-05-10 NOTE — ED Notes (Signed)
Gave pt food tray with juice. 

## 2018-05-10 NOTE — ED Notes (Signed)
Nurse called CT tech to notify them that pt was calmer and willing to try to hold still for CT

## 2018-05-10 NOTE — BH Assessment (Signed)
Assessment Note  Aaron Gibbs is an 83 y.o. male who presents to the ER via law enforcement, after he called them. Patient states, for the last two weeks he has had the thoughts of ending his life. He states, he is depressed about his health. "I'm getting old, I'm going blind and my daughters don't help me..." Patient admits, prior to coming to the ER, he had a gun to his head because he had the thoughts of ending his life.  During the interview, the patient was calm, cooperative and pleasant. When asked if he still having the thoughts of ending his life, he will not give a specific answer. He denies HI and AV/H. He denies having history of mental health concerns. He currently see's a counselor and it's through his panic clinic.  Per the patient, he lives alone an   Diagnosis: Depression  Past Medical History:  Past Medical History:  Diagnosis Date  . Anxiety   . Cancer ([place])   . COPD (chronic obstructive pulmonary disease) ([place])   . Glaucoma   . Osteoporosis    osteopenia    Past Surgical History:  Procedure Laterality Date  . CHOLECYSTECTOMY      Family History:  Family History  Problem Relation Age of Onset  . Depression Mother   . Heart disease Father     Social History:  reports that he has quit smoking. He quit after 15.00 years of use. His smokeless tobacco use includes chew. He reports that he drank alcohol. He reports that he does not use drugs.  Additional Social History:  Alcohol / Drug Use Pain Medications: See PTA Prescriptions: See PTA Over the Counter: See PTA History of alcohol / drug use?: No history of alcohol / drug abuse Longest period of sobriety (when/how long): Reports of none Negative Consequences of Use: (Reports of none) Withdrawal Symptoms: (n/a)  CIWA: CIWA-Ar BP: (!) 110/52 Pulse Rate: 79 COWS:    Allergies:  Allergies  Allergen Reactions  . Azithromycin Shortness Of Breath    Home Medications:  (Not in a hospital  admission)  OB/GYN Status:  No LMP for male patient.  General Assessment Data Location of Assessment: [facility] ED TTS Assessment: In system Is this a Tele or Face-to-Face Assessment?: Face-to-Face Is this an Initial Assessment or a Re-assessment for this encounter?: Initial Assessment Language Other than English: No Living Arrangements: Other (Comment)(Private Home) What gender do you identify as?: Male Marital status: Single Pregnancy Status: No Living Arrangements: Alone Can pt return to current living arrangement?: Yes Admission Status: Involuntary Petitioner: Police Is patient capable of signing voluntary admission?: No(Under IVC) Referral Source: Self/Family/Friend Insurance type: UHC  Medical Screening Exam ([place]) Medical Exam completed: Yes  Crisis Care Plan Living Arrangements: Alone Legal Guardian: Other:(Reports of none) Name of Psychiatrist: Reports of none Name of Therapist: Reports of none  Education Status Is patient currently in school?: No Is the patient employed, unemployed or receiving disability?: Unemployed, Receiving disability income  Risk to self with the past 6 months Suicidal Ideation: Yes-Currently Present Has patient been a risk to self within the past 6 months prior to admission? : Yes Suicidal Intent: Yes-Currently Present Has patient had any suicidal intent within the past 6 months prior to admission? : Yes Is patient at risk for suicide?: Yes Suicidal Plan?: Yes-Currently Present Has patient had any suicidal plan within the past 6 months prior to admission? : Yes Specify Current Suicidal Plan: Shot self with gun Access to Means: Yes Specify  Access to Suicidal Means: Have a gun What has been your use of drugs/alcohol within the last 12 months?: Reports of none Previous Attempts/Gestures: No How many times?: 0 Other Self Harm Risks: Reports of none Triggers for Past Attempts: None known Intentional Self Injurious Behavior:  None Family Suicide History: No Recent stressful life event(s): Other (Comment) Persecutory voices/beliefs?: No Depression: Yes Depression Symptoms: Isolating, Fatigue, Guilt, Loss of interest in usual pleasures, Feeling worthless/self pity Substance abuse history and/or treatment for substance abuse?: No Suicide prevention information given to non-admitted patients: Not applicable  Risk to Others within the past 6 months Homicidal Ideation: No Does patient have any lifetime risk of violence toward others beyond the six months prior to admission? : No Thoughts of Harm to Others: No Current Homicidal Intent: No Current Homicidal Plan: No Access to Homicidal Means: No Identified Victim: Reports of none History of harm to others?: No Assessment of Violence: None Noted Violent Behavior Description: Reports of none Does patient have access to weapons?: No Criminal Charges Pending?: No Does patient have a court date: No Is patient on probation?: No  Psychosis Hallucinations: None noted Delusions: None noted  Mental Status Report Appearance/Hygiene: Unremarkable, In scrubs Eye Contact: Fair Motor Activity: Freedom of movement, Unremarkable Speech: Logical/coherent, Unremarkable Level of Consciousness: Alert Mood: Depressed, Pleasant Affect: Appropriate to circumstance, Depressed Anxiety Level: Minimal Thought Processes: Coherent, Relevant Judgement: Partial Orientation: Person, Place, Time, Situation, Appropriate for developmental age Obsessive Compulsive Thoughts/Behaviors: Minimal  Cognitive Functioning Concentration: Normal Memory: Recent Intact, Remote Intact Is patient IDD: No Insight: Poor Impulse Control: Poor Appetite: Good Have you had any weight changes? : No Change Sleep: No Change Total Hours of Sleep: 8 Vegetative Symptoms: None  ADLScreening [facility] Assessment Services) Patient's cognitive ability adequate to safely complete daily activities?: Yes Patient  able to express need for assistance with ADLs?: Yes Independently performs ADLs?: Yes (appropriate for developmental age)  Prior Inpatient Therapy Prior Inpatient Therapy: No  Prior Outpatient Therapy Prior Outpatient Therapy: No Does patient have an ACCT team?: No Does patient have Intensive In-House Services?  : No Does patient have [facility] services? : No Does patient have P4CC services?: No  ADL Screening (condition at time of admission) Patient's cognitive ability adequate to safely complete daily activities?: Yes Is the patient deaf or have difficulty hearing?: No Does the patient have difficulty seeing, even when wearing glasses/contacts?: No Does the patient have difficulty concentrating, remembering, or making decisions?: No Patient able to express need for assistance with ADLs?: Yes Does the patient have difficulty dressing or bathing?: No Independently performs ADLs?: Yes (appropriate for developmental age) Does the patient have difficulty walking or climbing stairs?: No Weakness of Legs: None Weakness of Arms/Hands: None  Home Assistive Devices/Equipment Home Assistive Devices/Equipment: None  Therapy Consults (therapy consults require a physician order) PT Evaluation Needed: No OT Evalulation Needed: No SLP Evaluation Needed: No Abuse/Neglect Assessment (Assessment to be complete while patient is alone) Abuse/Neglect Assessment Can Be Completed: Yes Physical Abuse: Denies Verbal Abuse: Denies Sexual Abuse: Denies Exploitation of patient/patient's resources: Denies Self-Neglect: Denies Values / Beliefs Cultural Requests During Hospitalization: None Spiritual Requests During Hospitalization: None Consults Spiritual Care Consult Needed: No Social Work Consult Needed: No Regulatory affairs officer (For Healthcare) Does Patient Have a Medical Advance Directive?: Yes       Child/Adolescent Assessment Running Away Risk: Denies(Patient is an adult)  Disposition:   Disposition Initial Assessment Completed for this Encounter: Yes  On Site Evaluation by:   Reviewed with Physician:  Gunnar Fusi MS, LCAS, LPC, [place], CCSI Therapeutic Triage Specialist 05/10/2018 12:51 PM

## 2018-05-10 NOTE — ED Notes (Signed)
ED  Is the patient under IVC or is there intent for IVC: Yes.   Is the patient medically cleared: Yes.   Is there vacancy in the ED BHU: Yes.   Is the population mix appropriate for patient:  No  Pt is a fall risk   Is the patient awaiting placement in inpatient or outpatient setting: Yes.   Has the patient had a psychiatric consult:  Consult pending   Survey of unit performed for contraband, proper placement and condition of furniture, tampering with fixtures in bathroom, shower, and each patient room: Yes.  ; Findings:  APPEARANCE/BEHAVIOR  cooperative NEURO ASSESSMENT Orientation: oriented to self Hallucinations: No.None noted (Hallucinations) denies  Speech: Normal Gait: unsteady  Stand by assistance to be provided  Pt allowed to keep cane at bedside  RESPIRATORY ASSESSMENT Even  Unlabored respirations  CARDIOVASCULAR ASSESSMENT Pulses equal   regular rate  Skin warm and dry   GASTROINTESTINAL ASSESSMENT no GI complaint EXTREMITIES Full ROM  PLAN OF CARE Provide calm/safe environment. Vital signs assessed twice daily. ED BHU Assessment once each 12-hour shift. Collaborate with TTS daily or as condition indicates. Assure the ED provider has rounded once each shift. Provide and encourage hygiene. Provide redirection as needed. Assess for escalating behavior; address immediately and inform ED provider.  Assess family dynamic and appropriateness for visitation as needed: Yes.  ; If necessary, describe findings:  Educate the patient/family about BHU procedures/visitation: Yes.  ; If necessary, describe findings:

## 2018-05-10 NOTE — ED Notes (Signed)
BEHAVIORAL HEALTH ROUNDING Patient sleeping: No. Patient alert and oriented: yes Behavior appropriate: Yes.  ; If no, describe:  Nutrition and fluids offered: yes Toileting and hygiene offered: Yes  Sitter present: q15 minute observations and security  monitoring Law enforcement present: Yes  ODS  

## 2018-05-10 NOTE — ED Provider Notes (Addendum)
[facility] Emergency Department Provider Note  ____________________________________________   First MD Initiated Contact with Patient 05/10/18 [id number]     (approximate)  I have reviewed the triage vital signs and the nursing notes.   HISTORY  Chief Complaint Psychiatric Evaluation  Level 5 exemption history limited by the patient's altered mental status  HPI Aaron Gibbs is a 83 y.o. male who comes to the emergency department with [place] [employer] on an involuntary commitment for suicidal ideation.  The patient has a long-standing history of chronic pain is secondary to DISH and has opioid induced constipation.  He was seen in our emergency department earlier today where he had unremarkable blood work and a CT scan renal protocol showing a large amount of stool burden as well as a 4.2 cm infrarenal AAA.  He was discharged home however according to the patient his pain worsened and he felt like "you guys did not do anything".  He then took his handgun put it to his head and called 911.  His involuntary commitment states that the patient was threatening to shoot himself and then threatening to hang himself if only his gun was taken away.  He is actually quite confused on arrival to the emergency department making full evaluation quite difficult.    Past Medical History:  Diagnosis Date  . Anxiety   . Cancer ([place])   . COPD (chronic obstructive pulmonary disease) ([place])   . Glaucoma   . Osteoporosis    osteopenia    Patient Active Problem List   Diagnosis Date Noted  . Chronic pain syndrome 05/01/2018  . DISH (diffuse idiopathic skeletal hyperostosis) 05/01/2018  . Osteoarthritis of multiple joints 05/01/2018  . Bilateral chronic knee pain 05/01/2018  . Retinitis pigmentosa of both eyes 05/01/2018  . Glaucoma of both eyes 05/01/2018  . Essential hypertension 05/01/2018  . Chronic low back pain 05/01/2018  . GERD (gastroesophageal reflux disease) 05/01/2018   . 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 Therapeutic opioid-induced constipation (OIC) 05/01/2018  . Centrilobular emphysema ([place]) 05/01/2018    Past Surgical History:  Procedure Laterality Date  . CHOLECYSTECTOMY      Prior to Admission medications   Medication Sig Start Date End Date Taking? Authorizing Provider  amLODipine (NORVASC) 10 MG tablet Take 10 mg by mouth daily.    [provider]  aspirin EC 81 MG tablet Take 81 mg by mouth daily.    [provider]  doxycycline (VIBRAMYCIN) 100 MG capsule Take 100 mg by mouth 2 (two) times daily. For 10 days, every month, chronic pneumonia prophylaxis    [provider]  fenofibrate (TRICOR) 145 MG tablet Take 145 mg by mouth daily.    [provider]  ferrous sulfate 325 (65 FE) MG tablet Take 325 mg by mouth daily with breakfast.    [provider]  hydrochlorothiazide (MICROZIDE) 12.5 MG capsule Take 12.5 mg by mouth daily.    [provider]  meloxicam (MOBIC) 7.5 MG tablet Take 7.5 mg by mouth 2 (two) times daily.    [provider]  morphine (MS CONTIN) 15 MG 12 hr tablet Take 1 tablet (15 mg total) by mouth every 12 (twelve) hours. 05/01/18   Karamalegos, Devonne Doughty, DO  Multiple Vitamins-Minerals (MULTIVITAL PO) Take by mouth.    [provider]  oxyCODONE-acetaminophen (PERCOCET) 10-325 MG tablet Take 1 tablet by mouth 3 (three) times daily.    [provider]  pantoprazole (PROTONIX) 40 MG tablet Take  40 mg by mouth 2 (two) times daily.    [provider]  polyethylene glycol powder (GLYCOLAX/MIRALAX) powder Take 17 g by mouth daily as needed for moderate constipation. 05/09/18   Harvest Dark, MD  pregabalin (LYRICA) 100 MG capsule Take 100 mg by mouth 4 (four) times daily.    [provider]  senna (SENOKOT) 8.6 MG tablet Take 1 tablet by mouth daily.    [provider]  timolol (BETIMOL) 0.25 %  ophthalmic solution 1-2 drops 2 (two) times daily.    [provider]  umeclidinium-vilanterol (ANORO ELLIPTA) 62.5-25 MCG/INH AEPB Inhale 1 puff into the lungs daily.    [provider]  venlafaxine (EFFEXOR) 75 MG tablet Take 75 mg by mouth 2 (two) times daily.    [provider]    Allergies Azithromycin  Family History  Problem Relation Age of Onset  . Depression Mother   . Heart disease Father     Social History Social History   Tobacco Use  . Smoking status: Former Smoker    Years: 15.00  . Smokeless tobacco: Current User    Types: Chew  Substance Use Topics  . Alcohol use: Not Currently    Comment: past  . Drug use: Never    Review of Systems Level 5 exemption history limited by the patient's altered mental status  ____________________________________________   PHYSICAL EXAM:  VITAL SIGNS: ED Triage Vitals [05/10/18 0536]  Enc Vitals Group     BP (!) 151/77     Pulse Rate 87     Resp 16     Temp 98 F (36.7 C)     Temp Source Oral     SpO2 97 %     Weight 243 lb 12.8 oz (110.6 kg)     Height 5\' 10"  (1.778 m)     Head Circumference      Peak Flow      Pain Score 8     Pain Loc      Pain Edu?      Excl. in [place]?     Constitutional: Alert and oriented x1 appears obviously quite confused and uncomfortable.  Profoundly diaphoretic holding his abdomen Eyes: PERRL EOMI. midrange and brisk Head: Atraumatic. Nose: No congestion/rhinnorhea. Mouth/Throat: No trismus Neck: No stridor.   Cardiovascular: Tachycardic rate, regular rhythm. Grossly normal heart sounds.  Good peripheral circulation. Respiratory: Increased respiratory effort.  No retractions. Lungs CTAB and moving good air Gastrointestinal: Soft abdomen no peritonitis diffusely quite tender with no focality Guaiac negative control positive brown stool Musculoskeletal: No lower extremity edema   Neurologic:   No gross focal neurologic deficits are appreciated. Skin:  Diaphoretic Psychiatric: Encephalopathic   ____________________________________________   DIFFERENTIAL includes but not limited to  Ruptured AAA, aortic dissection, constipation, dehydration, appendicitis, diverticulitis, suicidal ideation, drug overdose ____________________________________________   LABS (all labs ordered are listed, but only abnormal results are displayed)  Labs Reviewed  LACTIC ACID, PLASMA  ACETAMINOPHEN LEVEL  COMPREHENSIVE METABOLIC PANEL  ETHANOL  SALICYLATE LEVEL  CBC WITH DIFFERENTIAL/PLATELET  URINE DRUG SCREEN, QUALITATIVE ([facility] ONLY)  LIPASE, BLOOD  TROPONIN I    Lab work reviewed by me shows normal lactic acid and the remainder of his blood work is pending __________________________________________  EKG  EKG pending at this time ____________________________________________  RADIOLOGY  CT angio of the chest abdomen pelvis pending at this time CT scan of the head pending at this time ____________________________________________   PROCEDURES  Procedure(s) performed: yes  Angiocath insertion  Performed by: Darel Hong  Consent: Verbal consent obtained. Risks and benefits: risks, benefits and alternatives were discussed Time out: Immediately prior to procedure a "time out" was called to verify the correct patient, procedure, equipment, support staff and site/side marked as required.  Preparation: Patient was prepped and draped in the usual sterile fashion.  Vein Location: right upper extremity  Ultrasound Guided  Gauge: 20  Normal blood return and flush without difficulty Patient tolerance: Patient tolerated the procedure well with no immediate complications.     Procedures  Critical Care performed: no  ____________________________________________   INITIAL IMPRESSION / ASSESSMENT AND PLAN / ED COURSE  Pertinent labs & imaging results that were available during my care of the patient were reviewed by me and  considered in my medical decision making (see chart for details).   As part of my medical decision making, I reviewed the following data within the [facility] History obtained from family if available, nursing notes, old chart and ekg, as well as notes from prior ED visits.  The patient arrives in a psychiatric room under involuntary commitment however when I come to evaluate him he is diaphoretic and confused.  On chart review he had a 4.2 cm infrarenal AAA diagnosed earlier today and my greatest concern would be an aortic catastrophe.  He also has a long-standing history of opioid-induced constipation that is failed MiraLAX, senna, and numerous other over-the-counter medications.  I will give him a trial of Relistor here in addition to resending labs including lactic acid and a CT scan dissection protocol.  Given his confusion CT scan of the head first.  The patient was on his way to the CT however he felt he had to have a large bowel movement after the Relistor.  Lactic acid is reassuring.  Care transition to Dr. Jimmye Norman he will follow-up on the lab work and CT angiogram.  I am upholding the patient's involuntary commitment as holding a handgun to his head is more than a mirror gesture and I am concerned about his mental status and well-being.      ____________________________________________   FINAL CLINICAL IMPRESSION(S) / ED DIAGNOSES  Final diagnoses:  History of abdominal aortic aneurysm (AAA)  Generalized abdominal pain  Suicidal behavior with attempted self-injury ([place])  Altered mental status, unspecified altered mental status type      NEW MEDICATIONS STARTED DURING THIS VISIT:  New Prescriptions   No medications on file     Note:  This document was prepared using Dragon voice recognition software and may include unintentional dictation errors.     Darel Hong, MD 05/10/18 1751    Darel Hong, MD 05/10/18 [phone]

## 2018-05-10 NOTE — ED Notes (Signed)
Dr. Jimmye Norman is stating pt is medically clear and he states pt is able to eat. Pt given a sandwich and repositioned to eat. Pt is IVC for previous threat to his own life.

## 2018-05-10 NOTE — ED Notes (Signed)
Pt talking on the phone with his daughter  Aaron Gibbs  [phone]

## 2018-05-10 NOTE — ED Provider Notes (Signed)
EKG: Interpreted by me, sinus tachycardia with a rate of 104 bpm, normal PR interval, normal QRS, normal axis, normal QT   Earleen Newport, MD 05/10/18 1058

## 2018-05-10 NOTE — ED Notes (Signed)
Pt sat on floor we had to get him back in bed. He is upset the DR has not been around to see him yet.

## 2018-05-10 NOTE — ED Notes (Signed)
Walked pt to the restroom to void and walked him back to bed.

## 2018-05-10 NOTE — ED Notes (Signed)
Moved pt to [facility] because he would not stay in bed.

## 2018-05-10 NOTE — ED Notes (Signed)
Walked pt to the restroom to void. Walked pt back to bed. Pt is getting up all the time and trying to walk away. Complains he is in pain and wants the DR now.

## 2018-05-10 NOTE — Consult Note (Signed)
[facility] Face-to-Face Psychiatry Consult   Reason for Consult: Consult for this 83 year old man who came into the hospital with suicidal thoughts Referring Physician: Paduchowski Patient Identification: Aaron Gibbs MRN:  [id number] Principal Diagnosis: Severe major depression, single episode, without psychotic features (Center) Diagnosis:  Principal Problem:   Severe major depression, single episode, without psychotic features ([place]) Active Problems:   Chronic pain syndrome   Retinitis pigmentosa of both eyes   Essential hypertension   Blindness   Suicidal ideation   Total Time spent with patient: 1 hour  Subjective:   Aaron Gibbs is a 83 y.o. male patient admitted with "I called the police".  HPI: Patient interviewed chart reviewed.  83 year old man called the police this morning saying that he could not take it anymore and telling them he was suicidal.  Patient had a gun in evidence and admitted he had put it to his head at one point.  On interview this afternoon the patient says he has been very depressed for several weeks.  He only relocated to [place] about a month ago.  He does not like the apartment he is living in.  The lights they are not bright enough.  His mood has been bad and down.  Sleeping poorly.  Probably not taking care of himself very well.  Denies hallucinations.  No evidence psychosis.  Has worsening suicidal ideation.  Denies that he is been drinking alcohol.  Social history: Patient had been living in a adult living facility in [place] but relocated to [place] to be closer to his daughters.  Unfortunately the independent living sounds like it is not working out very well.  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  Used to drink  heavily at one time decades ago but not since then.  Denies that he abuses any of his drugs  Past Psychiatric History: Patient denies having seen a psychiatrist mental health provider or therapist in the past.  He says he has never tried to kill himself before.  Never been in a psychiatric hospital.  Not aware of having ever been prescribed any psychiatric medicine.  Risk to Self: Suicidal Ideation: Yes-Currently Present Suicidal Intent: Yes-Currently Present Is patient at risk for suicide?: Yes Suicidal Plan?: Yes-Currently Present Specify Current Suicidal Plan: Shot self with gun Access to Means: Yes Specify Access to Suicidal Means: Have a gun What has been your use of drugs/alcohol within the last 12 months?: Reports of none How many times?: 0 Other Self Harm Risks: Reports of none Triggers for Past Attempts: None known Intentional Self Injurious Behavior: None Risk to Others: Homicidal Ideation: No Thoughts of Harm to Others: No Current Homicidal Intent: No Current Homicidal Plan: No Access to Homicidal Means: No Identified Victim: Reports of none History of harm to others?: No Assessment of Violence: None Noted Violent Behavior Description: Reports of none Does patient have access to weapons?: No Criminal Charges Pending?: No Does patient have a court date: No Prior Inpatient Therapy: Prior Inpatient Therapy: No Prior Outpatient Therapy: Prior Outpatient Therapy: No Does patient have an ACCT team?: No Does patient have Intensive In-House Services?  : No Does patient have [facility] services? : No Does patient have P4CC services?: No  Past Medical History:  Past Medical History:  Diagnosis Date  . Anxiety   . Cancer ([place])   .  COPD (chronic obstructive pulmonary disease) ([place])   . Glaucoma   . Osteoporosis    osteopenia    Past Surgical History:  Procedure Laterality Date  . CHOLECYSTECTOMY     Family History:  Family History  Problem Relation Age of Onset  .  Depression Mother   . Heart disease Father    Family Psychiatric  History: His mother had depression Social History:  Social History   Substance and Sexual Activity  Alcohol Use Not Currently   Comment: past     Social History   Substance and Sexual Activity  Drug Use Never    Social History   Socioeconomic History  . Marital status: Divorced    Spouse name: Not on file  . Number of children: Not on file  . Years of education: Not on file  . Highest education level: Not on file  Occupational History  . Not on file  Social Needs  . Financial resource strain: Not on file  . Food insecurity:    Worry: Not on file    Inability: Not on file  . Transportation needs:    Medical: Not on file    Non-medical: Not on file  Tobacco Use  . Smoking status: Former Smoker    Years: 15.00  . Smokeless tobacco: Current User    Types: Chew  Substance and Sexual Activity  . Alcohol use: Not Currently    Comment: past  . Drug use: Never  . Sexual activity: Not on file  Lifestyle  . Physical activity:    Days per week: Not on file    Minutes per session: Not on file  . Stress: Not on file  Relationships  . Social connections:    Talks on phone: Not on file    Gets together: Not on file    Attends religious service: Not on file    Active member of club or organization: Not on file    Attends meetings of clubs or organizations: Not on file    Relationship status: Not on file  Other Topics Concern  . Not on file  Social History Narrative  . Not on file   Additional Social History:    Allergies:   Allergies  Allergen Reactions  . Azithromycin Shortness Of Breath    Labs:  Results for orders placed or performed during the hospital encounter of 05/10/18 (from the past 48 hour(s))  Lactic acid, plasma     Status: None   Collection Time: 05/10/18  6:39 AM  Result Value Ref Range   Lactic Acid, Venous 1.2 0.5 - 1.9 mmol/L    Comment: Performed at [facility],  [address] [place], [place] [address]  Acetaminophen level     Status: Abnormal   Collection Time: 05/10/18  8:11 AM  Result Value Ref Range   Acetaminophen (Tylenol), Serum <10 (L) 10 - 30 ug/mL    Comment: (NOTE) Therapeutic concentrations vary significantly. A range of 10-30 ug/mL  may be an effective concentration for many patients. However, some  are best treated at concentrations outside of this range. Acetaminophen concentrations >150 ug/mL at 4 hours after ingestion  and >50 ug/mL at 12 hours after ingestion are often associated with  toxic reactions. Performed at [facility], [address] [place], [place] [address]   Comprehensive metabolic panel     Status: Abnormal   Collection Time: 05/10/18  8:11 AM  Result Value Ref Range   Sodium 144 135 - 145 mmol/L  Potassium 3.3 (L) 3.5 - 5.1 mmol/L   Chloride 107 98 - 111 mmol/L   CO2 26 22 - 32 mmol/L   Glucose, Bld 133 (H) 70 - 99 mg/dL   BUN 23 8 - 23 mg/dL   Creatinine, Ser 0.98 0.61 - 1.24 mg/dL   Calcium 9.8 8.9 - 10.3 mg/dL   Total Protein 7.2 6.5 - 8.1 g/dL   Albumin 4.2 3.5 - 5.0 g/dL   AST 25 15 - 41 U/L   ALT 20 0 - 44 U/L   Alkaline Phosphatase 46 38 - 126 U/L   Total Bilirubin 0.8 0.3 - 1.2 mg/dL   GFR calc non Af Amer >60 >60 mL/min   GFR calc Af Amer >60 >60 mL/min    Comment: (NOTE) The eGFR has been calculated using the CKD EPI equation. This calculation has not been validated in all clinical situations. eGFR's persistently <60 mL/min signify possible Chronic Kidney Disease.    Anion gap 11 5 - 15    Comment: Performed at [facility], [address] [place], [place] [address]  Ethanol     Status: None   Collection Time: 05/10/18  8:11 AM  Result Value Ref Range   Alcohol, Ethyl (B) <10 <10 mg/dL    Comment: (NOTE) Lowest detectable limit for serum alcohol is 10 mg/dL. For medical purposes only. Performed at [facility], [address]  [place], [place] [address]   Salicylate level     Status: None   Collection Time: 05/10/18  8:11 AM  Result Value Ref Range   Salicylate Lvl <6.8 2.8 - 30.0 mg/dL    Comment: Performed at [facility], [address] [place], [place] [address]  CBC with Differential     Status: None   Collection Time: 05/10/18  8:11 AM  Result Value Ref Range   WBC 7.9 4.0 - 10.5 K/uL   RBC 5.15 4.22 - 5.81 MIL/uL   Hemoglobin 15.0 13.0 - 17.0 g/dL   HCT 46.3 39.0 - 52.0 %   MCV 89.9 80.0 - 100.0 fL   MCH 29.1 26.0 - 34.0 pg   MCHC 32.4 30.0 - 36.0 g/dL   RDW 14.9 11.5 - 15.5 %   Platelets 199 150 - 400 K/uL   nRBC 0.0 0.0 - 0.2 %   Neutrophils Relative % 70 %   Neutro Abs 5.5 1.7 - 7.7 K/uL   Lymphocytes Relative 14 %   Lymphs Abs 1.1 0.7 - 4.0 K/uL   Monocytes Relative 10 %   Monocytes Absolute 0.8 0.1 - 1.0 K/uL   Eosinophils Relative 5 %   Eosinophils Absolute 0.4 0.0 - 0.5 K/uL   Basophils Relative 1 %   Basophils Absolute 0.0 0.0 - 0.1 K/uL   Immature Granulocytes 0 %   Abs Immature Granulocytes 0.03 0.00 - 0.07 K/uL    Comment: Performed at [facility], [address] [place], [place] [address]  Lipase, blood     Status: None   Collection Time: 05/10/18  8:11 AM  Result Value Ref Range   Lipase 19 11 - 51 U/L    Comment: Performed at [facility], [address] [place], [place] [address]  Troponin I - Once     Status: None   Collection Time: 05/10/18  8:11 AM  Result Value Ref Range   Troponin I <0.03 <0.03 ng/mL    Comment: Performed at [facility], [address], [place], [place] [address]  Current Facility-Administered Medications  Medication Dose Route Frequency Provider Last Rate Last Dose  . amLODipine (NORVASC) tablet 10 mg  10 mg Oral Daily Earleen Newport, MD   10 mg at 05/10/18 1554  . aspirin EC tablet 81 mg  81 mg Oral Daily Earleen Newport, MD   81 mg at 05/10/18 1554  . doxycycline (VIBRA-TABS) tablet 100 mg   100 mg Oral BID Earleen Newport, MD   100 mg at 05/10/18 1553  . fenofibrate tablet 160 mg  160 mg Oral Daily Earleen Newport, MD      . Derrill Memo ON 05/11/2018] ferrous sulfate tablet 325 mg  325 mg Oral Q breakfast Earleen Newport, MD      . hydrochlorothiazide (MICROZIDE) capsule 12.5 mg  12.5 mg Oral Daily Earleen Newport, MD   12.5 mg at 05/10/18 1553  . meloxicam (MOBIC) tablet 7.5 mg  7.5 mg Oral BID Earleen Newport, MD      . multivitamin with minerals tablet 1 tablet  1 tablet Oral Daily Earleen Newport, MD   1 tablet at 05/10/18 1553  . oxyCODONE-acetaminophen (PERCOCET/ROXICET) 5-325 MG per tablet 1 tablet  1 tablet Oral TID Earleen Newport, MD   1 tablet at 05/10/18 1553   And  . oxyCODONE (Oxy IR/ROXICODONE) immediate release tablet 5 mg  5 mg Oral TID Earleen Newport, MD   5 mg at 05/10/18 1555  . pantoprazole (PROTONIX) EC tablet 40 mg  40 mg Oral BID Earleen Newport, MD   40 mg at 05/10/18 1554  . polyethylene glycol (MIRALAX / GLYCOLAX) packet 17 g  17 g Oral Daily PRN Earleen Newport, MD      . pregabalin (LYRICA) capsule 100 mg  100 mg Oral QID Earleen Newport, MD   100 mg at 05/10/18 1554  . senna (SENOKOT) tablet 8.6 mg  1 tablet Oral Daily Earleen Newport, MD   8.6 mg at 05/10/18 1554  . timolol (TIMOPTIC) 0.25 % ophthalmic solution 1 drop  1 drop Both Eyes BID Earleen Newport, MD      . umeclidinium-vilanterol [facility] ELLIPTA) 62.5-25 MCG/INH 1 puff  1 puff Inhalation Daily Earleen Newport, MD      . venlafaxine [facility]) tablet 75 mg  75 mg Oral BID Earleen Newport, MD       Current Outpatient Medications  Medication Sig Dispense Refill  . amLODipine (NORVASC) 10 MG tablet Take 10 mg by mouth daily.    Marland Kitchen aspirin EC 81 MG tablet Take 81 mg by mouth daily.    Marland Kitchen doxycycline (VIBRAMYCIN) 100 MG capsule Take 100 mg by mouth 2 (two) times daily. For 10 days, every month, chronic pneumonia prophylaxis    .  fenofibrate (TRICOR) 145 MG tablet Take 145 mg by mouth daily.    . ferrous sulfate 325 (65 FE) MG tablet Take 325 mg by mouth daily with breakfast.    . hydrochlorothiazide (MICROZIDE) 12.5 MG capsule Take 12.5 mg by mouth daily.    . meloxicam (MOBIC) 7.5 MG tablet Take 7.5 mg by mouth 2 (two) times daily.    Marland Kitchen morphine (MS CONTIN) 15 MG 12 hr tablet Take 1 tablet (15 mg total) by mouth every 12 (twelve) hours.  0  . Multiple Vitamins-Minerals (MULTIVITAL PO) Take by mouth.    . oxyCODONE-acetaminophen (PERCOCET) 10-325 MG tablet Take 1 tablet by mouth 3 (three) times daily.    . pantoprazole (PROTONIX) 40  MG tablet Take 40 mg by mouth 2 (two) times daily.    . polyethylene glycol powder (GLYCOLAX/MIRALAX) powder Take 17 g by mouth daily as needed for moderate constipation. 255 g 0  . pregabalin (LYRICA) 100 MG capsule Take 100 mg by mouth 4 (four) times daily.    Marland Kitchen senna (SENOKOT) 8.6 MG tablet Take 1 tablet by mouth daily.    . timolol (BETIMOL) 0.25 % ophthalmic solution 1-2 drops 2 (two) times daily.    Marland Kitchen umeclidinium-vilanterol (ANORO ELLIPTA) 62.5-25 MCG/INH AEPB Inhale 1 puff into the lungs daily.    Marland Kitchen venlafaxine (EFFEXOR) 75 MG tablet Take 75 mg by mouth 2 (two) times daily.      Musculoskeletal: Strength & Muscle Tone: decreased Gait & Station: unsteady Patient leans: N/A  Psychiatric Specialty Exam: Physical Exam  Nursing note and vitals reviewed. Constitutional: He appears well-developed and well-nourished.  HENT:  Head: Normocephalic and atraumatic.  Eyes: Pupils are equal, round, and reactive to light.  Patient is at least partially blind both eyes.  Neck: Normal range of motion.  Cardiovascular: Regular rhythm and normal heart sounds.  Respiratory: Effort normal.  GI: Soft.  Musculoskeletal: Normal range of motion.  Neurological: He is alert.  Skin: Skin is warm and dry.  Psychiatric: His speech is delayed. He is slowed and withdrawn. Cognition and memory are  impaired. He expresses impulsivity. He exhibits a depressed mood. He expresses suicidal ideation. He expresses suicidal plans.    Review of Systems  Constitutional: Negative.   HENT: Negative.   Eyes: Negative.        Blindness.  I ask him if it was total and he said yes but then he indicated that with enough light he could see a little bit and then finally admitted that he could actually read if he had enough light and a magnifying glass  Respiratory: Negative.   Cardiovascular: Negative.   Gastrointestinal: Negative.   Musculoskeletal: Negative.   Skin: Negative.   Neurological: Negative.   Psychiatric/Behavioral: Positive for depression, memory loss and suicidal ideas. Negative for hallucinations and substance abuse. The patient is nervous/anxious and has insomnia.     Blood pressure 111/64, pulse 84, temperature 98.1 F (36.7 C), temperature source Oral, resp. rate 17, height 5' 10" (1.778 m), weight 110.6 kg, SpO2 94 %.Body mass index is 34.98 kg/m.  General Appearance: Disheveled  Eye Contact:  None  Speech:  Slow  Volume:  Decreased  Mood:  Depressed  Affect:  Constricted and Depressed  Thought Process:  Coherent  Orientation:  Full (Time, Place, and Person)  Thought Content:  Rumination  Suicidal Thoughts:  Yes.  with intent/plan  Homicidal Thoughts:  No  Memory:  Immediate;   Fair Recent;   Poor Remote;   Fair  Judgement:  Impaired  Insight:  Shallow  Psychomotor Activity:  Decreased  Concentration:  Concentration: Fair  Recall:  [employer] of Knowledge:  Fair  Language:  Fair  Akathisia:  No  Handed:  Right  AIMS (if indicated):     Assets:  Housing Social Support  ADL's:  Impaired  Cognition:  Impaired,  Mild  Sleep:        Treatment Plan Summary: Daily contact with patient to assess and evaluate symptoms and progress in treatment, Medication management and Plan Elderly gentleman with multiple medical problems who is contemplating suicide today and  actually had a pistol at home.  Patient is very depressed multiple symptoms of depression multiple risk factors.  Meets  commitment criteria.  Unsafe to send him home.  Patient has been medically cleared.  Case reviewed with TTS and emergency room physician.  Continue current medicines for usual medical problems.  Managed blindness with everyone being aware of the situation and providing plenty of light.  He is being referred for geriatric psychiatry admission for further treatment and evaluation of depression.  Disposition: Recommend psychiatric Inpatient admission when medically cleared. Supportive therapy provided about ongoing stressors.  Alethia Berthold, MD 05/10/2018 6:03 PM

## 2018-05-10 NOTE — ED Notes (Signed)
Pt. Resting in 20 hallway bed.  Pt. Calm and cooperative.  Pt. Requesting pain medication for knees and eye drops.  Pt. Informed medication is coming up from pharmacy.

## 2018-05-10 NOTE — ED Notes (Signed)
Report given to Amy, RN. Nurse help escort pt and make him comfortable in new area. Pt in NAD and is ambulatory with assist.

## 2018-05-10 NOTE — ED Notes (Signed)
Pt was getting out of bed again. We placed him back in bed and calmed him down.

## 2018-05-10 NOTE — ED Notes (Signed)
Patient alert and oriented. Patient states he called 911 because he is hurting really bad the worse he has ever hurt. Patient denies SI/HI/AVH. Patient states he had a BM yesterday and it was normal. Patient laying in bed in fetal position. Will continue to monitor.

## 2018-05-10 NOTE — ED Notes (Signed)
Pt was trying to get out of bed again. We talked him into shutting the lights off and taking a nap.

## 2018-05-10 NOTE — ED Notes (Signed)
Pt is back from CT and is sleeping at this time and in NAD.

## 2018-05-10 NOTE — ED Notes (Signed)
Pt is getting up again.Helped him back to bed.

## 2018-05-11 NOTE — BH Assessment (Signed)
Writer followed up with referrals:  [facility] ([phone]), declined due to medical acuity.  [facility] [phone]), left a voicemail message requesting a return phone call.  [facility] ([phone]), information refaxed  [facility] [phone]), unable to reach anyone.  [facility] ([phone]), Declined due to medical acuity  [facility] [phone]), Declined due to medical acuity.  [facility] [phone]), Pending review.    Information faxed to additional facilities;  [facility] [phone]),  [facility] [phone])  [facility] [phone])  [facility] [phone])

## 2018-05-11 NOTE — ED Notes (Signed)
Pt asked to speak with RN about his plan.  RN explained the doctor wants him to be transferred to another hospital for inpatient psychiatric treatment. RN further explained this decision was made because he came into the hospital stated he was suicidal. Pt currently denies SI and wishes to return home to him life.    Maintained on 15 minute checks and observation by security for safety.

## 2018-05-11 NOTE — ED Notes (Signed)
Pt. Indicated he felt constipated.

## 2018-05-11 NOTE — ED Provider Notes (Signed)
-----------------------------------------   6:23 AM on 05/11/2018 -----------------------------------------   Blood pressure 111/64, pulse 84, temperature 98.1 F (36.7 C), temperature source Oral, resp. rate 17, height 5\' 10"  (1.778 m), weight 110.6 kg, SpO2 94 %.  The patient had no acute events since last update.  Calm and cooperative at this time.  Disposition is pending Psychiatry/Behavioral Medicine team recommendations.     Paulette Blanch, MD 05/11/18 [phone]

## 2018-05-11 NOTE — ED Notes (Signed)
Pt compliant with all medications.  Denies SI/HI and AVH. Pt stated his pain is "alright."  Scheduled pain medications given.   Maintained on 15 minute checks and observation by security for safety.

## 2018-05-11 NOTE — ED Notes (Signed)
Pt. Is calm and cooperative. Pt. Appears to be in a good mood.  Pt. Has no complaints at this time.

## 2018-05-12 LAB — URINE DRUG SCREEN, QUALITATIVE (ARMC ONLY)
Amphetamines, Ur Screen: NOT DETECTED
Barbiturates, Ur Screen: NOT DETECTED
Benzodiazepine, Ur Scrn: POSITIVE — AB
CANNABINOID 50 NG, UR ~~LOC~~: NOT DETECTED
COCAINE METABOLITE, UR ~~LOC~~: NOT DETECTED
MDMA (ECSTASY) UR SCREEN: POSITIVE — AB
Methadone Scn, Ur: NOT DETECTED
OPIATE, UR SCREEN: POSITIVE — AB
PHENCYCLIDINE (PCP) UR S: NOT DETECTED
Tricyclic, Ur Screen: NOT DETECTED

## 2018-05-12 MED ORDER — MAGNESIUM HYDROXIDE 400 MG/5ML PO SUSP
ORAL | Status: AC
  Filled 2018-05-12: qty 30

## 2018-05-12 MED ORDER — MAGNESIUM HYDROXIDE 400 MG/5ML PO SUSP
30.0000 mL | Freq: Once | ORAL | Status: AC
  Administered 2018-05-12: 30 mL via ORAL

## 2018-05-12 MED ORDER — MAGNESIUM HYDROXIDE 400 MG/5ML PO SUSP
30.0000 mL | Freq: Every day | ORAL | Status: DC | PRN
  Administered 2018-05-12: 30 mL via ORAL
  Filled 2018-05-12: qty 30

## 2018-05-12 NOTE — ED Notes (Signed)
Pt given clean clothes and allowed to shower. New sheets placed on bed.

## 2018-05-12 NOTE — ED Notes (Signed)
Hourly rounding reveals patient in room. No complaints, stable, in no acute distress. Q15 minute rounds and monitoring via Rover and Officer to continue.   

## 2018-05-12 NOTE — ED Notes (Signed)
Pt given meal tray.

## 2018-05-12 NOTE — ED Notes (Signed)
Report given to [place]

## 2018-05-12 NOTE — ED Notes (Signed)
Spoke with Patient's Daughter, Mihailo Sage, who would like to be called by Dr. Weber Cooks after his next assessment of patient.  Her number is [phone].

## 2018-05-12 NOTE — ED Notes (Signed)
Report from [facility]. Patient sleeping, respirations regular and unlabored. Q15 minute rounds and observation by Engineer, drilling to continue.

## 2018-05-12 NOTE — ED Notes (Signed)
Pt c/o not having BM for 5 days. MD notified.

## 2018-05-12 NOTE — ED Notes (Signed)
Kerry Dory and [employer] RN sent to speak with POA daughter of patient after she requested to speak with someone higher up about situation

## 2018-05-12 NOTE — ED Notes (Signed)
Sent down urine sample for UDS by EDT Edison Nasuti

## 2018-05-12 NOTE — BH Assessment (Signed)
Information faxed to additional facilities;  [facility] ([phone]), Patient declined due to medical.  [facility] [phone]), unable to reach anyone  [facility] ([phone]), No beds  [facility] (Cat), Wait List  [facility] [phone]), Unable to reach anyone

## 2018-05-12 NOTE — ED Notes (Addendum)
Patient gives consent for his daughters to be updated re: condition, placement, and any thing else relating to his care and health.  Consent witnessed by Ian Malkin, TTS.  Patient's daughters: Jailin Moomaw:  [place]:  [phone]

## 2018-05-12 NOTE — BH Assessment (Signed)
Writer called to update patient's daughter ([phone]) about the patient's dispositions. Also informed her, the patient's POA paperwork hadn't been received. The original plan was for her to send them to the writer via e-mail, but she couldn't get them scanned until tomorrow (05/13/2018) when she's at work. Daughter  agreed to faxed them tomorrow. Writer provided her with the [facility] fax number.

## 2018-05-12 NOTE — BH Assessment (Signed)
Writer spoke with patient's daughter Clements Toro) while she was visiting the patient. Writer had daughter to step away, while verbal consent to talk with her was giving.  Informed daughter of the recommendation of Inpatient Psychiatric Treatment. Writer discussed with the daughter and patient the risk factors of why the recommendation hasn't changed.   Informed the daughter she didn't received a phone call because the patient told staff not to call her.  Daughter was giving patient's pass code and contact information for the ER.

## 2018-05-12 NOTE — ED Notes (Signed)
Received a call for [facility] requesting an clinical update. Clinical update has been provided, no restraints or IM medications noted within 24 hours. Noted that the pt has yet to provided a UDS. TTS will speak with the pt nurse and request that thi UDS be completed and TTS to fax updated results.

## 2018-05-12 NOTE — ED Notes (Signed)
Daughter at bedside visiting with patient at this time.

## 2018-05-12 NOTE — ED Provider Notes (Signed)
Vitals:   05/11/18 0920 05/11/18 0931  BP: (!) 148/85 (!) 148/85  Pulse: 88   Resp: 18   Temp:    SpO2:       No acute events reported to me overnight by nursing or physician report.  Patient remains under involuntary commitment with recognition for psychiatric hospitalization.  Disposition per psychiatry/behavioral medicine team.  I reviewed the notes from Ian Malkin from yesterday, patient has been referred to multiple facilities.   Lisa Roca, MD 05/12/18 1101

## 2018-05-13 ENCOUNTER — Emergency Department: Payer: Medicare Other

## 2018-05-13 MED ORDER — GLYCERIN (LAXATIVE) 1.2 G RE SUPP
2.0000 | Freq: Once | RECTAL | Status: AC
  Administered 2018-05-13: 2.4 g via RECTAL
  Filled 2018-05-13: qty 2

## 2018-05-13 MED ORDER — SENNOSIDES-DOCUSATE SODIUM 8.6-50 MG PO TABS
2.0000 | ORAL_TABLET | Freq: Every day | ORAL | Status: DC
  Administered 2018-05-13: 2 via ORAL
  Filled 2018-05-13: qty 2

## 2018-05-13 MED ORDER — MAGNESIUM CITRATE PO SOLN
1.0000 | Freq: Once | ORAL | Status: AC
  Administered 2018-05-13: 1 via ORAL
  Filled 2018-05-13: qty 296

## 2018-05-13 NOTE — ED Notes (Signed)
Daughter called by this RN to inform that pt was transferring at this time, no answer at this time

## 2018-05-13 NOTE — ED Notes (Signed)
Hourly rounding reveals patient in room. No complaints, stable, in no acute distress. Q15 minute rounds and monitoring via Rover and Officer to continue.   

## 2018-05-13 NOTE — ED Notes (Signed)
Pt eating breakfast at this time.  

## 2018-05-13 NOTE — ED Notes (Signed)
Pt eating lunch at this time

## 2018-05-13 NOTE — ED Notes (Signed)
Pt awake and ambulatory to restroom at this time

## 2018-05-13 NOTE — ED Notes (Signed)
Pt ambulatory to bathroom at this time.

## 2018-05-13 NOTE — ED Notes (Signed)
Pt asleep, breakfast tray placed in rm.  

## 2018-05-13 NOTE — ED Provider Notes (Addendum)
-----------------------------------------   6:18 AM on 05/13/2018 -----------------------------------------   Blood pressure 110/68, pulse 62, temperature 98.2 F (36.8 C), temperature source Oral, resp. rate 18, height 1.778 m (5\' 10" ), weight 110.6 kg, SpO2 95 %.  The patient has been having worsening abdominal pain which he attributes to constipation.  States he has not had a BM in at least a week, and also states that he usually takes a senna daily but has not been on it since he has been here.  I ordered plain films of his abdomen to look for any signs of obstruction such as air-fluid levels or dilated loops of bowel, but I do not see any evidence of obstruction myself, and the radiologist did not comment on anything other than what does appear to be a moderate to heavy stool burden.  I offered the patient an enema, but he prefers not to have one.  Instead we decided upon a glycerin suppository, bottle of magnesium citrate, and two tablets daily (including now) of Senokot-S (senna + colace).  Pending placement.    Hinda Kehr, MD 05/13/18 [phone]

## 2018-05-13 NOTE — BH Assessment (Signed)
[facility]([phone]), unable to reach anyone  [facility]([phone]), unable to reach anyone  [employer] Merry Proud), under reviewed by [address] RN  [place] [phone]), unable to locate original fax - re-faxed

## 2018-05-13 NOTE — ED Notes (Signed)
Pt on the phone with daughter at this time

## 2018-05-13 NOTE — BH Assessment (Addendum)
Pt's daughter was provided with accepting facility address and contact number.  When this writer called pt's daughter ([phone]) to inform her of accepting facility she became irate with this Probation officer because she felt like the accepting facility was "too far" in distance. She requested to "speak to someone - a Education officer, museum or someone who can assist me with this matter". She reports "who do I need to speak to about needing to take legal actions because I don't agree with this decision".    This Probation officer explained to pt's daughter that pt has been denied by every other local facility that treats geriatric-psych patients due to his medical acuity. She then started to say "he is not a danger to himself ... He was recommended for inpatient treatment after making one comment".  This Probation officer explained pt's current risk factors and his IVC status.   She is requesting a callback from Dr. Weber Cooks at [phone].

## 2018-05-13 NOTE — ED Notes (Signed)
Pt ambulatory to restroom at this time. 

## 2018-05-13 NOTE — ED Notes (Signed)
Pt up to bathroom using cane.

## 2018-05-13 NOTE — ED Notes (Signed)
EMTALA reviewed by charge RN 

## 2018-05-13 NOTE — ED Provider Notes (Signed)
Patient received a bed placement at [facility].  TTS discussed with patient's family who were dissatisfied that it is so far away.  However, this appears to be the only option to advance the patient's care at the present time, and given his high risk for serious self-harm or completed suicide at present time, I think it appropriate and in the patient's best interest to proceed with transfer and psychiatric care.   Carrie Mew, MD 05/13/18 1318

## 2018-05-13 NOTE — ED Notes (Signed)
Pt c/o stomach pain and that he still didn't have a BM for 5 days. Notified MD. Patient went for abdomen/chest x-ray.

## 2018-05-13 NOTE — BH Assessment (Signed)
Patient has been accepted to [facility].  Patient assigned to unit 900. Accepting physician is Dr. Cardell Peach.  Call report to [phone].  Representative was [employer].   ER Staff is aware of it:  [name], ER Secretary  Dr. Joni Fears, ER MD  [place], Patient's Nurse     Patient's [place] ([phone]) have been updated as well.

## 2018-05-26 ENCOUNTER — Encounter: Payer: Self-pay | Admitting: Emergency Medicine

## 2018-05-26 ENCOUNTER — Emergency Department: Payer: Medicare Other

## 2018-05-26 ENCOUNTER — Other Ambulatory Visit: Payer: Self-pay

## 2018-05-26 ENCOUNTER — Emergency Department
Admission: EM | Admit: 2018-05-26 | Discharge: 2018-05-26 | Disposition: A | Discharge status: Home / Self Care | Payer: Medicare Other | Attending: Emergency Medicine | Admitting: Emergency Medicine

## 2018-05-26 DIAGNOSIS — R079 Chest pain, unspecified: Secondary | ICD-10-CM

## 2018-05-26 DIAGNOSIS — Z79899 Other long term (current) drug therapy: Secondary | ICD-10-CM | POA: Diagnosis not present

## 2018-05-26 DIAGNOSIS — J441 Chronic obstructive pulmonary disease with (acute) exacerbation: Secondary | ICD-10-CM | POA: Diagnosis not present

## 2018-05-26 DIAGNOSIS — Z7982 Long term (current) use of aspirin: Secondary | ICD-10-CM | POA: Diagnosis not present

## 2018-05-26 DIAGNOSIS — Z87891 Personal history of nicotine dependence: Secondary | ICD-10-CM | POA: Insufficient documentation

## 2018-05-26 LAB — CBC
HEMATOCRIT: 50 % (ref 39.0–52.0)
HEMOGLOBIN: 15.8 g/dL (ref 13.0–17.0)
MCH: 29.5 pg (ref 26.0–34.0)
MCHC: 31.6 g/dL (ref 30.0–36.0)
MCV: 93.3 fL (ref 80.0–100.0)
Platelets: 215 10*3/uL (ref 150–400)
RBC: 5.36 MIL/uL (ref 4.22–5.81)
RDW: 14.7 % (ref 11.5–15.5)
WBC: 7.8 10*3/uL (ref 4.0–10.5)
nRBC: 0 % (ref 0.0–0.2)

## 2018-05-26 LAB — BASIC METABOLIC PANEL
Anion gap: 9 (ref 5–15)
BUN: 28 mg/dL — ABNORMAL HIGH (ref 8–23)
CO2: 25 mmol/L (ref 22–32)
Calcium: 9.6 mg/dL (ref 8.9–10.3)
Chloride: 111 mmol/L (ref 98–111)
Creatinine, Ser: 0.99 mg/dL (ref 0.61–1.24)
GFR calc Af Amer: >60 mL/min (ref >60)
GFR calc non Af Amer: >60 mL/min (ref >60)
Glucose, Bld: 149 mg/dL — ABNORMAL HIGH (ref 70–99)
POTASSIUM: 4 mmol/L (ref 3.5–5.1)
Sodium: 145 mmol/L (ref 135–145)

## 2018-05-26 LAB — TROPONIN I
Troponin I: <0.03 ng/mL (ref <0.03)
Troponin I: <0.03 ng/mL (ref <0.03)

## 2018-05-26 MED ORDER — ALBUTEROL SULFATE (2.5 MG/3ML) 0.083% IN NEBU
5.0000 mg | INHALATION_SOLUTION | Freq: Once | RESPIRATORY_TRACT | Status: AC
  Administered 2018-05-26: 5 mg via RESPIRATORY_TRACT
  Filled 2018-05-26: qty 6

## 2018-05-26 MED ORDER — LORAZEPAM 0.5 MG PO TABS
0.5000 mg | ORAL_TABLET | Freq: Once | ORAL | Status: AC
  Administered 2018-05-26: 0.5 mg via ORAL
  Filled 2018-05-26: qty 1

## 2018-05-26 MED ORDER — ALBUTEROL SULFATE HFA 108 (90 BASE) MCG/ACT IN AERS: 2.0000 | INHALATION_SPRAY | RESPIRATORY_TRACT | 0 refills | Status: DC | PRN

## 2018-05-26 NOTE — ED Notes (Signed)
Green top recollected 

## 2018-05-26 NOTE — ED Notes (Signed)
Pt up out of bed stating that he was not getting back in bed until he gets pain medication. Pt given prescribed medication and directed back to bed.

## 2018-05-26 NOTE — ED Triage Notes (Signed)
PT to ED via EMS from home with c/o substernal CP that started today, pt appears diaphoretic. Per EMS VSS, NSR on EKG. PT A&OX4

## 2018-05-26 NOTE — Progress Notes (Signed)
LCSW spoke with ED doctor/EDRN and ED charge LCSW was engaged with other patients at time of consult. It was  explained that daughter needs to start with family doctor for FL2. As he based on doctor's description will not meet SNF inpatient criteria- Dr indicated he is medically cleared just waiting for tropin measures.  LCSW will meet with patient and daughter in morning if they still are here.  Sukhman Martine LCSW

## 2018-05-26 NOTE — ED Provider Notes (Signed)
[facility] Emergency Department Provider Note  ____________________________________________  Time seen: Approximately 7:33 PM  I have reviewed the triage vital signs and the nursing notes.   HISTORY  Chief Complaint Chest Pain    HPI Aaron Gibbs is a 83 y.o. male with a history of COPD, GERD, and prior opioid dependence and alcohol abuse according to family who comes the ED complaining of central chest pain, described as sharp and tightness, no aggravating or alleviating factors.  Onset earlier today while getting in bed , constant since then.  Not exertional.  Not pleuritic.  Associate with diaphoresis and shortness of breath and nonproductive cough.  Nonradiating.  He started taking doxycycline again today as his pulmonologist has instructed him to do whenever he gets a flareup release once a month.  He has follow-up with his pulmonologist in 2 days.  Daughter also notes that she is worried about his ability to care for himself at home due to blindness and tendency to just stay in bed for days at a time and not perform ADLs such as hygiene and cooking.     Past Medical History:  Diagnosis Date  . Anxiety   . Cancer ([place])   . COPD (chronic obstructive pulmonary disease) ([place])   . Glaucoma   . Osteoporosis    osteopenia     Patient Active Problem List   Diagnosis Date Noted  . Severe major depression, single episode, without psychotic features ([place]) 05/10/2018  . Blindness 05/10/2018  . Suicidal ideation 05/10/2018  . Chronic pain syndrome 05/01/2018  . DISH (diffuse idiopathic skeletal hyperostosis) 05/01/2018  . Osteoarthritis of multiple joints 05/01/2018  . Bilateral chronic knee pain 05/01/2018  . Retinitis pigmentosa of both eyes 05/01/2018  . Glaucoma of both eyes 05/01/2018  . Essential hypertension 05/01/2018  . Chronic low back pain 05/01/2018  . GERD (gastroesophageal reflux disease) 05/01/2018  . AVM (arteriovenous  malformation) of colon 05/01/2018  . Therapeutic opioid-induced constipation (OIC) 05/01/2018  . Centrilobular emphysema ([place]) 05/01/2018     Past Surgical History:  Procedure Laterality Date  . CHOLECYSTECTOMY       Prior to Admission medications   Medication Sig Start Date End Date Taking? Authorizing Provider  albuterol (PROVENTIL HFA) 108 (90 Base) MCG/ACT inhaler Inhale 2 puffs into the lungs every 4 (four) hours as needed for wheezing or shortness of breath. 05/26/18   Carrie Mew, MD  amLODipine (NORVASC) 10 MG tablet Take 10 mg by mouth daily.    [provider]  aspirin EC 81 MG tablet Take 81 mg by mouth daily.    [provider]  doxycycline (VIBRAMYCIN) 100 MG capsule Take 100 mg by mouth 2 (two) times daily. For 10 days, every month, chronic pneumonia prophylaxis    [provider]  fenofibrate (TRICOR) 145 MG tablet Take 145 mg by mouth daily.    [provider]  ferrous sulfate 325 (65 FE) MG tablet Take 325 mg by mouth daily with breakfast.    [provider]  hydrochlorothiazide (MICROZIDE) 12.5 MG capsule Take 12.5 mg by mouth daily.    [provider]  meloxicam (MOBIC) 7.5 MG tablet Take 7.5 mg by mouth 2 (two) times daily.    [provider]  morphine (MS CONTIN) 15 MG 12 hr tablet Take 1 tablet (15 mg total) by mouth every 12 (twelve) hours. 05/01/18   Karamalegos, Devonne Doughty, DO  Multiple Vitamins-Minerals (MULTIVITAL PO) Take by mouth.    [provider]  oxyCODONE-acetaminophen (PERCOCET) 10-325 MG tablet Take 1 tablet by mouth 3 (three) times daily.    [provider]  pantoprazole (PROTONIX) 40 MG tablet Take 40 mg by mouth 2 (two) times daily.    [provider]  polyethylene glycol powder (GLYCOLAX/MIRALAX) powder Take 17 g by mouth daily as needed for moderate constipation. 05/09/18   Harvest Dark, MD  pregabalin (LYRICA) 100 MG capsule Take 100 mg by mouth 4  (four) times daily.    [provider]  senna (SENOKOT) 8.6 MG tablet Take 1 tablet by mouth daily.    [provider]  timolol (BETIMOL) 0.25 % ophthalmic solution 1-2 drops 2 (two) times daily.    [provider]  umeclidinium-vilanterol (ANORO ELLIPTA) 62.5-25 MCG/INH AEPB Inhale 1 puff into the lungs daily.    [provider]  venlafaxine (EFFEXOR) 75 MG tablet Take 75 mg by mouth 2 (two) times daily.    [provider]     Allergies Azithromycin   Family History  Problem Relation Age of Onset  . Depression Mother   . Heart disease Father     Social History Social History   Tobacco Use  . Smoking status: Former Smoker    Years: 15.00  . Smokeless tobacco: Current User    Types: Chew  Substance Use Topics  . Alcohol use: Not Currently    Comment: past  . Drug use: Never    Review of Systems  Constitutional:   No fever or chills.  ENT:   No sore throat. No rhinorrhea. Cardiovascular:   Positive as above chest pain without syncope. Respiratory: Positive shortness of breath and nonproductive cough. Gastrointestinal:   Negative for abdominal pain, vomiting and diarrhea.  Musculoskeletal:   Negative for focal pain All other systems reviewed and are negative except as documented above in ROS and HPI.  ____________________________________________   PHYSICAL EXAM:  VITAL SIGNS: ED Triage Vitals  Enc Vitals Group     BP --      Pulse Rate 05/26/18 1437 87     Resp --      Temp 05/26/18 1437 (!) 96 F (35.6 C)     Temp Source 05/26/18 1437 Axillary     SpO2 --      Weight --      Height --      Head Circumference --      Peak Flow --      Pain Score 05/26/18 1433 7     Pain Loc --      Pain Edu? --      Excl. in [place]? --     Vital signs reviewed, nursing assessments reviewed.   Constitutional:   Alert and oriented. Non-toxic appearance. Eyes:   Conjunctivae are normal. EOMI. PERRL. ENT      Head:    Normocephalic and atraumatic.      Nose:   No congestion/rhinnorhea.       Mouth/Throat:   MMM, no pharyngeal erythema. No peritonsillar mass.       Neck:   No meningismus. Full ROM. Hematological/Lymphatic/Immunilogical:   No cervical lymphadenopathy. Cardiovascular:   RRR, heart rate 80. Symmetric bilateral radial and DP pulses.  No murmurs. Cap refill less than 2 seconds. Respiratory:   Normal respiratory effort without tachypnea/retractions.  No focal crackles or wheezes, but there are bronchial breath sounds diffusely.   Gastrointestinal:   Soft and nontender. Non distended. There is no CVA tenderness.  No rebound, rigidity, or guarding.  Musculoskeletal:  Normal range of motion in all extremities. No joint effusions.  No lower extremity tenderness.  No edema. Neurologic:   Normal speech and language.  Motor grossly intact. Ambulatory with steady gait No acute focal neurologic deficits are appreciated.  Skin:    Skin is warm, dry and intact. No rash noted.  No petechiae, purpura, or bullae.  ____________________________________________    LABS (pertinent positives/negatives) (all labs ordered are listed, but only abnormal results are displayed) Labs Reviewed  BASIC METABOLIC PANEL - Abnormal; Notable for the following components:      Result Value   Glucose, Bld 149 (*)    BUN 28 (*)    All other components within normal limits  CBC  TROPONIN I  TROPONIN I   ____________________________________________   EKG  Interpreted by me  Date: 05/26/2018  Rate: 89  Rhythm: normal sinus rhythm  QRS Axis: normal  Intervals: normal  ST/T Wave abnormalities: normal  Conduction Disutrbances: none  Narrative Interpretation: unremarkable      ____________________________________________    RADIOLOGY  Dg Chest 2 View  Result Date: 05/26/2018 CLINICAL DATA:  Shortness of breath, chest pain EXAM: CHEST - 2 VIEW COMPARISON:  05/13/2018 FINDINGS: Lungs are clear.  No pleural  effusion or pneumothorax. The heart is normal in size. Exaggerated thoracic kyphosis with degenerative changes. IMPRESSION: No evidence of acute cardiopulmonary disease. Electronically Signed   By: Julian Hy M.D.   On: 05/26/2018 15:55    ____________________________________________   PROCEDURES Procedures  ____________________________________________  DIFFERENTIAL DIAGNOSIS   Non-STEMI, pneumonia, pneumothorax, COPD exacerbation/bronchitis  CLINICAL IMPRESSION / ASSESSMENT AND PLAN / ED COURSE  Pertinent labs & imaging results that were available during my care of the patient were reviewed by me and considered in my medical decision making (see chart for details).    Patient presents with atypical chest pain.  Doubt PE or dissection or pericarditis.  Doubt pulmonary edema or CHF.  Patient given low-dose oral Ativan for anxiety at his request along with bronchodilators.  Chest x-ray and labs including 2 troponins all unremarkable.  Vital signs unremarkable.  Due to daughter's concerns about patient's ability to care for himself at home, I ordered a PT evaluation and a social work consult, but unfortunately they were unable to come today.  Continued conversation with daughter at bedside, noting that the patient could stay here till morning to have these evaluations or could go home and follow-up with primary care who can also facilitate the services.  They opt to go home.  I will prescribe an albuterol inhaler to use as needed.  Recommend he continue his doxycycline which she has an adequate supply of at home.  Encourage close follow-up with primary care as well as keeping his appointment with pulmonology this week.  He is ambulatory, knowledgeable about his medications, stable for discharge home in good condition.  He is feeling better after the bronchodilators and no longer diaphoretic and states that his chest pain is better.       ____________________________________________   FINAL CLINICAL IMPRESSION(S) / ED DIAGNOSES    Final diagnoses:  Nonspecific chest pain  COPD exacerbation [facility])     ED Discharge Orders         Ordered    albuterol (PROVENTIL HFA) 108 (90 Base) MCG/ACT inhaler  Every 4 hours PRN     05/26/18 1932          Portions of this note were generated with dragon dictation software. Dictation errors may occur despite best  attempts at proofreading.    Carrie Mew, MD 05/26/18 [phone]

## 2018-05-26 NOTE — ED Notes (Signed)
Spoke with ED social worker to check on status of social work consult. Per Claudine, she was not aware that there was a consult in for a social work consult and she will not be able to see the patient today but she will see him tomorrow.

## 2018-05-26 NOTE — ED Notes (Signed)
Social worker claudine to bedside to speak with patient and daughter. Daughter given information about contacting social work for further assistance

## 2018-05-26 NOTE — ED Notes (Signed)
Pt and Pt's daughter verbalized understanding of discharge instructions. NAD at this time. 

## 2018-05-27 ENCOUNTER — Telehealth: Payer: Self-pay

## 2018-05-27 NOTE — Telephone Encounter (Signed)
Called patient's daughter, Aaron Gibbs, discussed case, she also called after hours line , received her message. She has concerns about father (patient, Aaron Gibbs) due to his significant mental health issues, and history of risk for self harm, see prior ED visit notes, he required psychiatric stabilization discharged to [facility] in 04/2018, discharged later but still awaiting future ALF placement.   She has been in cotanct with Social worker [place] Worker [phone], she is waiting to hear back on options.  Home health is not an option for them, given limited accessibility to patient, he has not let in some home health staff in past and declines this, he and family are interested in ALF or facility, will need location to help with mental health as well.  She will check on status and notify us once location is identified and we can help with paperwork likely FL2.  Advised lastly that safety is priority and precautions given, if any risk to his health or threat he should be seen at hospital ED for stabilization may require further intervention for his safety, he may be eligible for discharge to facility in future if this is the case.  [name], DO [facility] 05/27/2018, 1:19 PM

## 2018-05-27 NOTE — Telephone Encounter (Signed)
As per patient's daughter [name] he has Hx of mental illness, alcohol abuse, his blindness is getting worst, 5 weeks ago he as in the hospital for constipation and mentioned about chronic pain. Yesterday was in the hospital for SOB and has plan to go today. As per daughter he is unable to take care of himself and wants home health to involve with his care.

## 2018-05-28 ENCOUNTER — Ambulatory Visit: Payer: Medicare Other | Admitting: Nurse Practitioner

## 2018-05-29 ENCOUNTER — Emergency Department: Payer: Medicare Other

## 2018-05-29 ENCOUNTER — Other Ambulatory Visit: Payer: Self-pay

## 2018-05-29 ENCOUNTER — Emergency Department
Admission: EM | Admit: 2018-05-29 | Discharge: 2018-05-29 | Disposition: A | Discharge status: Home / Self Care | Payer: Medicare Other | Attending: Emergency Medicine | Admitting: Emergency Medicine

## 2018-05-29 ENCOUNTER — Ambulatory Visit: Payer: Self-pay | Admitting: Family Medicine

## 2018-05-29 ENCOUNTER — Telehealth: Payer: Self-pay | Admitting: Family Medicine

## 2018-05-29 DIAGNOSIS — R5383 Other fatigue: Secondary | ICD-10-CM | POA: Diagnosis present

## 2018-05-29 DIAGNOSIS — J449 Chronic obstructive pulmonary disease, unspecified: Secondary | ICD-10-CM | POA: Diagnosis not present

## 2018-05-29 DIAGNOSIS — B3789 Other sites of candidiasis: Secondary | ICD-10-CM | POA: Insufficient documentation

## 2018-05-29 DIAGNOSIS — Z87891 Personal history of nicotine dependence: Secondary | ICD-10-CM | POA: Diagnosis not present

## 2018-05-29 DIAGNOSIS — Z79899 Other long term (current) drug therapy: Secondary | ICD-10-CM | POA: Diagnosis not present

## 2018-05-29 LAB — CBC
HEMATOCRIT: 51.5 % (ref 39.0–52.0)
HEMOGLOBIN: 16.2 g/dL (ref 13.0–17.0)
MCH: 29.7 pg (ref 26.0–34.0)
MCHC: 31.5 g/dL (ref 30.0–36.0)
MCV: 94.5 fL (ref 80.0–100.0)
Platelets: 194 10*3/uL (ref 150–400)
RBC: 5.45 MIL/uL (ref 4.22–5.81)
RDW: 14.3 % (ref 11.5–15.5)
WBC: 7.2 10*3/uL (ref 4.0–10.5)
nRBC: 0 % (ref 0.0–0.2)

## 2018-05-29 LAB — COMPREHENSIVE METABOLIC PANEL
ALT: 44 U/L (ref 0–44)
AST: 50 U/L — ABNORMAL HIGH (ref 15–41)
Albumin: 4.2 g/dL (ref 3.5–5.0)
Alkaline Phosphatase: 53 U/L (ref 38–126)
Anion gap: 13 (ref 5–15)
BUN: 30 mg/dL — ABNORMAL HIGH (ref 8–23)
CALCIUM: 9.6 mg/dL (ref 8.9–10.3)
CO2: 19 mmol/L — ABNORMAL LOW (ref 22–32)
Chloride: 111 mmol/L (ref 98–111)
Creatinine, Ser: 0.99 mg/dL (ref 0.61–1.24)
GFR calc Af Amer: >60 mL/min (ref >60)
GFR calc non Af Amer: >60 mL/min (ref >60)
Glucose, Bld: 114 mg/dL — ABNORMAL HIGH (ref 70–99)
Potassium: 4.1 mmol/L (ref 3.5–5.1)
Sodium: 143 mmol/L (ref 135–145)
Total Bilirubin: 0.9 mg/dL (ref 0.3–1.2)
Total Protein: 7.3 g/dL (ref 6.5–8.1)

## 2018-05-29 LAB — URINALYSIS, COMPLETE (UACMP) WITH MICROSCOPIC
Bacteria, UA: NONE SEEN
Bilirubin Urine: NEGATIVE
Glucose, UA: NEGATIVE mg/dL
Hgb urine dipstick: NEGATIVE
KETONES UR: NEGATIVE mg/dL
Leukocytes, UA: NEGATIVE
Nitrite: NEGATIVE
Protein, ur: 100 mg/dL — AB
SPECIFIC GRAVITY, URINE: 1.036 — AB (ref 1.005–1.030)
pH: 5 (ref 5.0–8.0)

## 2018-05-29 LAB — INFLUENZA PANEL BY PCR (TYPE A & B)
Influenza A By PCR: NEGATIVE
Influenza B By PCR: NEGATIVE

## 2018-05-29 MED ORDER — LORAZEPAM 1 MG PO TABS
1.0000 mg | ORAL_TABLET | Freq: Once | ORAL | Status: AC
  Administered 2018-05-29: 1 mg via ORAL
  Filled 2018-05-29: qty 1

## 2018-05-29 MED ORDER — IPRATROPIUM-ALBUTEROL 0.5-2.5 (3) MG/3ML IN SOLN
3.0000 mL | Freq: Once | RESPIRATORY_TRACT | Status: AC
  Administered 2018-05-29: 3 mL via RESPIRATORY_TRACT
  Filled 2018-05-29: qty 3

## 2018-05-29 MED ORDER — NYSTATIN 100000 UNIT/GM EX POWD: Freq: Four times a day (QID) | CUTANEOUS | 0 refills | Status: DC

## 2018-05-29 NOTE — ED Notes (Signed)
Patient requested flu swab, Md aware, flu swab obtained and sent to lab. Patient afebrile with stable vss. Reports feeling weakness.patient refusing to sit on bed, states hospitals give him anxiety.  Unable to sit in bed with legs up.

## 2018-05-29 NOTE — Telephone Encounter (Signed)
I called the pt to f/u with him about his symptoms. He was seen in the ER x 3 days ago, but states nothing was done and he's not any better. The pt complains today of sweating profusely, intermittent fever and chill. x2days He denies any chest pain which is the reason he was seen at the hospital on 12/08. He requesting that you call him in something for his symptoms. He states he's not sure if he will be able to keep his appointment schedule at 2:40pm, because of transportation issues. I informed the patient that he needs to be evaluated and if his symptoms worsen or not improve that I recommend him calling 911. He verbalize understand and stated he will try to call a cab to bring him to his appointment today.

## 2018-05-29 NOTE — Telephone Encounter (Signed)
The pt was notified.He informed me that he will call and see if he can get someone to take him to the Emergency room. I stress that he can call 911 for transportation to the hospital. He stated that he will call me back and let me know if he get a ride.

## 2018-05-29 NOTE — Telephone Encounter (Signed)
I have called.

## 2018-05-29 NOTE — Telephone Encounter (Signed)
Pt went to ER recently.  He said they didn't give any medication and he is not better.  He's running a fever with heavy sweats.  I put him on the schedule for 2:40 but he's not sure is he can get a ride.  Please call [phone]

## 2018-05-29 NOTE — ED Notes (Signed)
Lab called to add on urine culture.  ?

## 2018-05-29 NOTE — ED Triage Notes (Signed)
Feeling weak for over a week. Reports sweats and chills. Denies fever/cough or diarrhea.

## 2018-05-29 NOTE — Telephone Encounter (Signed)
Reviewed triage case as discussed with Victorino Sparrow, CMA.  Given the uncertainty if he can even get a ride or cab to follow-up at our office later this afternoon, I would recommend that instead he go straight to the [facility] ED for another evaluation, this is the most comprehensive way for him to be evaluated - he will likely need prompt lab testing and imaging possibly even CT imaging of lungs to rule out a deeper pneumonia with his fever, and sweats. Also may benefit from IV Fluids.  It does not sound like the oral antibiotics are sufficient for him, sometimes they can switch to IV antibiotics if indicated.  He has not done well at home as well, we are waiting to coordinate with CSW to locate a bed for him in meantime, otherwise if PT eval can be expedited through hospital that would be optimal.  [name], [facility] 05/29/2018, 12:39 PM

## 2018-05-29 NOTE — ED Provider Notes (Signed)
[facility] Emergency Department Provider Note  ____________________________________________  None    (approximate)  I have reviewed the triage vital signs and the nursing notes.   HISTORY  Chief Complaint Fatigue   HPI Aaron Gibbs is a 83 y.o. male who has a previous history of COPD, prior cancer as well as anxiety disorder  Patient reports, for about a week now he has had intermittent times will be very sweaty feeling chills.  Noticed over the last 3 to 4 days as well that it feels like a discomfort around the lower back, describes as a different feeling in his "kidney area".  Points to his right flank.  Denies that it is painful, but reports it is just a slight discomfort feeling and feels like he is not urinating as much as normal.  He denies any trouble breathing today.  He started doxycycline 3 days ago, has not had a cough or fever.  He is however experiencing feeling of sweats frequently throughout the last week.  No chest pain.  No abdominal pain.  No nausea vomiting or diarrhea  He is also noticed a slight rash along his groin on both sides, pointing towards his inguinal skin folds bilaterally   Past Medical History:  Diagnosis Date  . Anxiety   . Cancer ([place])   . COPD (chronic obstructive pulmonary disease) ([place])   . Glaucoma   . Osteoporosis    osteopenia    Patient Active Problem List   Diagnosis Date Noted  . Severe major depression, single episode, without psychotic features ([place]) 05/10/2018  . Blindness 05/10/2018  . Suicidal ideation 05/10/2018  . Chronic pain syndrome 05/01/2018  . DISH (diffuse idiopathic skeletal hyperostosis) 05/01/2018  . Osteoarthritis of multiple joints 05/01/2018  . Bilateral chronic knee pain 05/01/2018  . Retinitis pigmentosa of both eyes 05/01/2018  . Glaucoma of both eyes 05/01/2018  . Essential hypertension 05/01/2018  . Chronic low back pain 05/01/2018  . GERD (gastroesophageal reflux  disease) 05/01/2018  . 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 Therapeutic opioid-induced constipation (OIC) 05/01/2018  . Centrilobular emphysema ([place]) 05/01/2018    Past Surgical History:  Procedure Laterality Date  . CHOLECYSTECTOMY      Prior to Admission medications   Medication Sig Start Date End Date Taking? Authorizing Provider  albuterol (PROVENTIL HFA) 108 (90 Base) MCG/ACT inhaler Inhale 2 puffs into the lungs every 4 (four) hours as needed for wheezing or shortness of breath. 05/26/18   Carrie Mew, MD  amLODipine (NORVASC) 10 MG tablet Take 10 mg by mouth daily.    [provider]  aspirin EC 81 MG tablet Take 81 mg by mouth daily.    [provider]  doxycycline (VIBRAMYCIN) 100 MG capsule Take 100 mg by mouth 2 (two) times daily. For 10 days, every month, chronic pneumonia prophylaxis    [provider]  fenofibrate (TRICOR) 145 MG tablet Take 145 mg by mouth daily.    [provider]  ferrous sulfate 325 (65 FE) MG tablet Take 325 mg by mouth daily with breakfast.    [provider]  hydrochlorothiazide (MICROZIDE) 12.5 MG capsule Take 12.5 mg by mouth daily.    [provider]  meloxicam (MOBIC) 7.5 MG tablet Take 7.5 mg by mouth 2 (two) times daily.    [provider]  morphine (MS CONTIN) 15 MG 12 hr tablet Take 1 tablet (15 mg total) by mouth every 12 (twelve) hours. 05/01/18   [name]  J, DO  Multiple Vitamins-Minerals (MULTIVITAL PO) Take by mouth.    [provider]  nystatin (MYCOSTATIN/NYSTOP) powder Apply topically 4 (four) times daily. 05/29/18   Delman Kitten, MD  oxyCODONE-acetaminophen (PERCOCET) 10-325 MG tablet Take 1 tablet by mouth 3 (three) times daily.    [provider]  pantoprazole (PROTONIX) 40 MG tablet Take 40 mg by mouth 2 (two) times daily.    [provider]  polyethylene glycol powder (GLYCOLAX/MIRALAX) powder Take 17 g by  mouth daily as needed for moderate constipation. 05/09/18   Harvest Dark, MD  pregabalin (LYRICA) 100 MG capsule Take 100 mg by mouth 4 (four) times daily.    [provider]  senna (SENOKOT) 8.6 MG tablet Take 1 tablet by mouth daily.    [provider]  timolol (BETIMOL) 0.25 % ophthalmic solution 1-2 drops 2 (two) times daily.    [provider]  umeclidinium-vilanterol (ANORO ELLIPTA) 62.5-25 MCG/INH AEPB Inhale 1 puff into the lungs daily.    [provider]  venlafaxine (EFFEXOR) 75 MG tablet Take 75 mg by mouth 2 (two) times daily.    [provider]    Allergies Azithromycin  Family History  Problem Relation Age of Onset  . Depression Mother   . Heart disease Father     Social History Social History   Tobacco Use  . Smoking status: Former Smoker    Years: 15.00  . Smokeless tobacco: Current User    Types: Chew  Substance Use Topics  . Alcohol use: Not Currently    Comment: past  . Drug use: Never    Review of Systems Constitutional: No fever/chills Eyes: No visual changes. ENT: No sore throat. Cardiovascular: Denies chest pain. Respiratory: Denies shortness of breath. Gastrointestinal: No abdominal pain.  Slight discomfort around his "kidney area" of his right lower back. Genitourinary: Negative for dysuria. Musculoskeletal: Negative for back pain except for the slight area that describes a little bit of uncomfortable feeling around his kidney. Skin: Negative for rash except for some time now he is noted a rash that has been applying Neosporin to over both sides of his groin and the skin folds, and seems to weep occasionally has some very small "spots". Neurological: Negative for headaches, areas of focal weakness or numbness.    ____________________________________________   PHYSICAL EXAM:  VITAL SIGNS: ED Triage Vitals  Enc Vitals Group     BP 05/29/18 1510 (!) 168/72     Pulse Rate 05/29/18 1510 78      Resp 05/29/18 1510 20     Temp 05/29/18 1510 98.6 F (37 C)     Temp Source 05/29/18 1510 Oral     SpO2 05/29/18 1508 96 %     Weight --      Height --      Head Circumference --      Peak Flow --      Pain Score 05/29/18 1512 0     Pain Loc --      Pain Edu? --      Excl. in [place]? --    Of note, patient reports he gets severe anxiety around hospitals, he is requesting anxiolytic and reports that he is thinks he is used Ativan with good effect in the past for this.  Constitutional: Alert and oriented. Well appearing and in no acute distress, but does appear to have a chronic medical illness. Eyes: Conjunctivae are normal. Head: Atraumatic. Nose: No congestion/rhinnorhea. Mouth/Throat: Mucous membranes are moist. Neck:  No stridor.  Cardiovascular: Normal rate, regular rhythm. Grossly normal heart sounds.  Good peripheral circulation. Respiratory: Normal respiratory effort.  No retractions. Lungs CTAB.  A slightly prolonged expiratory phase, but no wheezing.  No trouble breathing and speaks in full sentences. Gastrointestinal: Soft and nontender. No distention. Musculoskeletal: No lower extremity tenderness nor edema. Neurologic:  Normal speech and language. No gross focal neurologic deficits are appreciated.  Skin:  Skin is warm, dry and intact. No rash noted except for the intertriginous regions of the bilateral inguinal folds, there is slight occasionally scaly lesions in small clusters along the groin folds with slight redness at the bases. Psychiatric: Mood and affect are normal. Speech and behavior are normal.  ____________________________________________   LABS (all labs ordered are listed, but only abnormal results are displayed)  Labs Reviewed  URINALYSIS, COMPLETE (UACMP) WITH MICROSCOPIC - Abnormal; Notable for the following components:      Result Value   Color, Urine AMBER (*)    APPearance CLEAR (*)    Specific Gravity, Urine 1.036 (*)    Protein, ur 100 (*)     All other components within normal limits  COMPREHENSIVE METABOLIC PANEL - Abnormal; Notable for the following components:   CO2 19 (*)    Glucose, Bld 114 (*)    BUN 30 (*)    AST 50 (*)    All other components within normal limits  URINE CULTURE  CBC  INFLUENZA PANEL BY PCR (TYPE A & B)   ____________________________________________  EKG  Reviewed entered by me at 1515 Heart rate 75 QRS 90 QTc 430 Normal sinus rhythm, occasional PAC.  No evidence of ischemia or ectopy. ____________________________________________  RADIOLOGY  Dg Chest 2 View  Result Date: 05/29/2018 CLINICAL DATA:  Chills and sweats for 1 week EXAM: CHEST - 2 VIEW COMPARISON:  05/26/2018, CT chest 05/10/2018 FINDINGS: Patchy opacity at the lingula, may reflect atelectasis or scarring. This does not appear significantly changed. Normal heart size. No pneumothorax. IMPRESSION: Patchy atelectasis or scarring at the lingula. Electronically Signed   By: Donavan Foil M.D.   On: 05/29/2018 15:50    No evidence of clear consolidation or pneumonia.  Some patchy atelectasis and scarring is noted around the lingula.  Of note, the patient is afebrile, normal white count and denies cough.  Currently on doxycycline, clinically I doubt pneumonia at this time.  Possibly some element of slight bronchitis given slight nasal congestion. ____________________________________________   PROCEDURES  Procedure(s) performed: None  Procedures  Critical Care performed: No  ____________________________________________   INITIAL IMPRESSION / ASSESSMENT AND PLAN / ED COURSE  Pertinent labs & imaging results that were available during my care of the patient were reviewed by me and considered in my medical decision making (see chart for details).   Patient returns for evaluation for about a week now stop and feeling too well, he is noticed some slight discomfort around his kidney area, very reassuring clinical examination without  any evidence of acute abdomen, no costovertebral angle tenderness, no active abdominal pain.  Urinalysis no evidence of infection, will send for culture.  Flu negative.  Chest x-ray, lab work reassuring.  Slightly decreased CO2, notes minimally elevated BUN, normal creatinine.  Discussed with patient, he will work on hydration at home, also plan to treat for intertriginous probable candidiasis of the inguinal folds.  ----------------------------------------- 5:33 PM on 05/29/2018 -----------------------------------------  No evidence of acute neurologic, vascular, cardiac or pulmonary concern.  Reassuring abdominal examination.  Daughter at the bedside  now, reports his symptoms been ongoing for over a week's time.  At this time, encouraged patient to continue his doxycycline, will treat with nystatin, have patient follow-up closely with PCP.  Return precautions and treatment recommendations and follow-up discussed with the patient who is agreeable with the plan.  Patient appears well and stable for discharge with close outpatient follow-up.      ____________________________________________   FINAL CLINICAL IMPRESSION(S) / ED DIAGNOSES  Final diagnoses:  [place] rash of groin  Chills mostly at night for about 1 week      Note:  This document was prepared using Dragon voice recognition software and may include unintentional dictation errors       Delman Kitten, MD 05/29/18 1734

## 2018-05-30 ENCOUNTER — Ambulatory Visit: Payer: Medicare Other | Admitting: Nurse Practitioner

## 2018-05-31 LAB — URINE CULTURE
Culture: <10000 — AB
Special Requests: NORMAL

## 2018-06-04 ENCOUNTER — Emergency Department: Payer: Medicare Other

## 2018-06-04 ENCOUNTER — Other Ambulatory Visit: Payer: Self-pay

## 2018-06-04 ENCOUNTER — Observation Stay
Admission: EM | Admit: 2018-06-04 | Discharge: 2018-06-05 | Disposition: A | Discharge status: Home / Self Care | Payer: Medicare Other | Attending: Internal Medicine | Admitting: Internal Medicine

## 2018-06-04 ENCOUNTER — Observation Stay: Payer: Medicare Other

## 2018-06-04 DIAGNOSIS — H409 Unspecified glaucoma: Secondary | ICD-10-CM | POA: Insufficient documentation

## 2018-06-04 DIAGNOSIS — Z8249 Family history of ischemic heart disease and other diseases of the circulatory system: Secondary | ICD-10-CM | POA: Diagnosis not present

## 2018-06-04 DIAGNOSIS — F419 Anxiety disorder, unspecified: Secondary | ICD-10-CM | POA: Insufficient documentation

## 2018-06-04 DIAGNOSIS — F331 Major depressive disorder, recurrent, moderate: Secondary | ICD-10-CM

## 2018-06-04 DIAGNOSIS — K219 Gastro-esophageal reflux disease without esophagitis: Secondary | ICD-10-CM | POA: Insufficient documentation

## 2018-06-04 DIAGNOSIS — Z791 Long term (current) use of non-steroidal anti-inflammatories (NSAID): Secondary | ICD-10-CM | POA: Insufficient documentation

## 2018-06-04 DIAGNOSIS — J449 Chronic obstructive pulmonary disease, unspecified: Secondary | ICD-10-CM | POA: Diagnosis not present

## 2018-06-04 DIAGNOSIS — Z7982 Long term (current) use of aspirin: Secondary | ICD-10-CM | POA: Insufficient documentation

## 2018-06-04 DIAGNOSIS — F332 Major depressive disorder, recurrent severe without psychotic features: Secondary | ICD-10-CM

## 2018-06-04 DIAGNOSIS — G894 Chronic pain syndrome: Secondary | ICD-10-CM | POA: Insufficient documentation

## 2018-06-04 DIAGNOSIS — E860 Dehydration: Secondary | ICD-10-CM | POA: Diagnosis not present

## 2018-06-04 DIAGNOSIS — I10 Essential (primary) hypertension: Secondary | ICD-10-CM | POA: Diagnosis not present

## 2018-06-04 DIAGNOSIS — F451 Undifferentiated somatoform disorder: Secondary | ICD-10-CM

## 2018-06-04 DIAGNOSIS — Z87891 Personal history of nicotine dependence: Secondary | ICD-10-CM | POA: Diagnosis not present

## 2018-06-04 DIAGNOSIS — R55 Syncope and collapse: Principal | ICD-10-CM

## 2018-06-04 DIAGNOSIS — G9341 Metabolic encephalopathy: Secondary | ICD-10-CM | POA: Diagnosis present

## 2018-06-04 DIAGNOSIS — M810 Age-related osteoporosis without current pathological fracture: Secondary | ICD-10-CM | POA: Diagnosis not present

## 2018-06-04 LAB — CBC WITH DIFFERENTIAL/PLATELET
Abs Immature Granulocytes: 0.03 10*3/uL (ref 0.00–0.07)
BASOS PCT: 1 %
Basophils Absolute: 0.1 10*3/uL (ref 0.0–0.1)
Eosinophils Absolute: 0.3 10*3/uL (ref 0.0–0.5)
Eosinophils Relative: 4 %
HCT: 45.7 % (ref 39.0–52.0)
Hemoglobin: 15 g/dL (ref 13.0–17.0)
Immature Granulocytes: 0 %
Lymphocytes Relative: 15 %
Lymphs Abs: 1.2 10*3/uL (ref 0.7–4.0)
MCH: 29.3 pg (ref 26.0–34.0)
MCHC: 32.8 g/dL (ref 30.0–36.0)
MCV: 89.3 fL (ref 80.0–100.0)
Monocytes Absolute: 0.7 10*3/uL (ref 0.1–1.0)
Monocytes Relative: 9 %
NRBC: 0 % (ref 0.0–0.2)
Neutro Abs: 5.8 10*3/uL (ref 1.7–7.7)
Neutrophils Relative %: 71 %
PLATELETS: 185 10*3/uL (ref 150–400)
RBC: 5.12 MIL/uL (ref 4.22–5.81)
RDW: 14.1 % (ref 11.5–15.5)
WBC: 8.1 10*3/uL (ref 4.0–10.5)

## 2018-06-04 LAB — URINALYSIS, COMPLETE (UACMP) WITH MICROSCOPIC
Bacteria, UA: NONE SEEN
Bilirubin Urine: NEGATIVE
Glucose, UA: NEGATIVE mg/dL
HGB URINE DIPSTICK: NEGATIVE
Ketones, ur: NEGATIVE mg/dL
Leukocytes, UA: NEGATIVE
Nitrite: NEGATIVE
Protein, ur: 30 mg/dL — AB
Specific Gravity, Urine: 1.02 (ref 1.005–1.030)
Squamous Epithelial / HPF: NONE SEEN (ref 0–5)
pH: 5 (ref 5.0–8.0)

## 2018-06-04 LAB — COMPREHENSIVE METABOLIC PANEL
ALT: 28 U/L (ref 0–44)
ANION GAP: 12 (ref 5–15)
AST: 26 U/L (ref 15–41)
Albumin: 4.1 g/dL (ref 3.5–5.0)
Alkaline Phosphatase: 44 U/L (ref 38–126)
BUN: 23 mg/dL (ref 8–23)
CO2: 18 mmol/L — ABNORMAL LOW (ref 22–32)
Calcium: 9.6 mg/dL (ref 8.9–10.3)
Chloride: 110 mmol/L (ref 98–111)
Creatinine, Ser: 0.84 mg/dL (ref 0.61–1.24)
GFR calc Af Amer: >60 mL/min (ref >60)
GFR calc non Af Amer: >60 mL/min (ref >60)
Glucose, Bld: 121 mg/dL — ABNORMAL HIGH (ref 70–99)
Potassium: 3.6 mmol/L (ref 3.5–5.1)
SODIUM: 140 mmol/L (ref 135–145)
Total Bilirubin: 0.8 mg/dL (ref 0.3–1.2)
Total Protein: 6.9 g/dL (ref 6.5–8.1)

## 2018-06-04 LAB — TROPONIN I
Troponin I: <0.03 ng/mL (ref <0.03)
Troponin I: <0.03 ng/mL (ref <0.03)
Troponin I: <0.03 ng/mL (ref <0.03)

## 2018-06-04 LAB — GLUCOSE, CAPILLARY: Glucose-Capillary: 133 mg/dL — ABNORMAL HIGH (ref 70–99)

## 2018-06-04 LAB — LIPASE, BLOOD: Lipase: 24 U/L (ref 11–51)

## 2018-06-04 MED ORDER — OXYCODONE-ACETAMINOPHEN 10-325 MG PO TABS: 1.0000 | ORAL_TABLET | Freq: Three times a day (TID) | ORAL | Status: DC

## 2018-06-04 MED ORDER — PANTOPRAZOLE SODIUM 40 MG PO TBEC
40.0000 mg | DELAYED_RELEASE_TABLET | Freq: Two times a day (BID) | ORAL | Status: DC
  Administered 2018-06-04 – 2018-06-05 (×2): 40 mg via ORAL
  Filled 2018-06-04 (×3): qty 1

## 2018-06-04 MED ORDER — ACETAMINOPHEN 650 MG RE SUPP: 650.0000 mg | Freq: Four times a day (QID) | RECTAL | Status: DC | PRN

## 2018-06-04 MED ORDER — ACETAMINOPHEN 325 MG PO TABS: 650.0000 mg | ORAL_TABLET | Freq: Four times a day (QID) | ORAL | Status: DC | PRN

## 2018-06-04 MED ORDER — ASPIRIN EC 81 MG PO TBEC
81.0000 mg | DELAYED_RELEASE_TABLET | Freq: Every day | ORAL | Status: DC
  Administered 2018-06-05: 81 mg via ORAL
  Filled 2018-06-04: qty 1

## 2018-06-04 MED ORDER — PREGABALIN 50 MG PO CAPS: 100.0000 mg | ORAL_CAPSULE | Freq: Four times a day (QID) | ORAL | Status: DC

## 2018-06-04 MED ORDER — TIMOLOL MALEATE 0.25 % OP SOLN
1.0000 [drp] | Freq: Two times a day (BID) | OPHTHALMIC | Status: DC
  Administered 2018-06-04 – 2018-06-05 (×2): 1 [drp] via OPHTHALMIC
  Filled 2018-06-04: qty 5

## 2018-06-04 MED ORDER — MORPHINE SULFATE ER 15 MG PO TBCR
15.0000 mg | EXTENDED_RELEASE_TABLET | Freq: Two times a day (BID) | ORAL | Status: DC
  Administered 2018-06-04 – 2018-06-05 (×2): 15 mg via ORAL
  Filled 2018-06-04 (×2): qty 1

## 2018-06-04 MED ORDER — SODIUM CHLORIDE 0.9 % IV SOLN
INTRAVENOUS | Status: DC
  Administered 2018-06-04: 18:00:00 via INTRAVENOUS

## 2018-06-04 MED ORDER — PREGABALIN 50 MG PO CAPS
100.0000 mg | ORAL_CAPSULE | Freq: Two times a day (BID) | ORAL | Status: DC
  Administered 2018-06-05: 100 mg via ORAL
  Filled 2018-06-04: qty 2

## 2018-06-04 MED ORDER — OXYCODONE HCL 5 MG PO TABS
5.0000 mg | ORAL_TABLET | Freq: Three times a day (TID) | ORAL | Status: DC
  Administered 2018-06-04 – 2018-06-05 (×2): 5 mg via ORAL
  Filled 2018-06-04 (×2): qty 1

## 2018-06-04 MED ORDER — VENLAFAXINE HCL 37.5 MG PO TABS
75.0000 mg | ORAL_TABLET | Freq: Two times a day (BID) | ORAL | Status: DC
  Administered 2018-06-05: 75 mg via ORAL
  Filled 2018-06-04 (×3): qty 2

## 2018-06-04 MED ORDER — VENLAFAXINE HCL ER 75 MG PO CP24
75.0000 mg | ORAL_CAPSULE | Freq: Every day | ORAL | Status: DC
  Administered 2018-06-05: 75 mg via ORAL
  Filled 2018-06-04: qty 1

## 2018-06-04 MED ORDER — MELOXICAM 7.5 MG PO TABS
7.5000 mg | ORAL_TABLET | Freq: Two times a day (BID) | ORAL | Status: DC
  Administered 2018-06-04 – 2018-06-05 (×2): 7.5 mg via ORAL
  Filled 2018-06-04 (×2): qty 1

## 2018-06-04 MED ORDER — ENOXAPARIN SODIUM 40 MG/0.4ML ~~LOC~~ SOLN
40.0000 mg | SUBCUTANEOUS | Status: DC
  Administered 2018-06-04: 40 mg via SUBCUTANEOUS
  Filled 2018-06-04: qty 0.4

## 2018-06-04 MED ORDER — PREGABALIN 50 MG PO CAPS
100.0000 mg | ORAL_CAPSULE | Freq: Two times a day (BID) | ORAL | Status: DC
  Administered 2018-06-04: 100 mg via ORAL
  Filled 2018-06-04: qty 2

## 2018-06-04 MED ORDER — GUAIFENESIN 100 MG/5ML PO SOLN
200.0000 mg | ORAL | Status: DC | PRN
  Administered 2018-06-04: 200 mg via ORAL
  Filled 2018-06-04 (×2): qty 10

## 2018-06-04 MED ORDER — ONDANSETRON HCL 4 MG PO TABS: 4.0000 mg | ORAL_TABLET | Freq: Four times a day (QID) | ORAL | Status: DC | PRN

## 2018-06-04 MED ORDER — SENNOSIDES-DOCUSATE SODIUM 8.6-50 MG PO TABS: 1.0000 | ORAL_TABLET | Freq: Every evening | ORAL | Status: DC | PRN

## 2018-06-04 MED ORDER — AMLODIPINE BESYLATE 10 MG PO TABS
10.0000 mg | ORAL_TABLET | Freq: Every day | ORAL | Status: DC
  Administered 2018-06-05: 10 mg via ORAL
  Filled 2018-06-04: qty 1

## 2018-06-04 MED ORDER — ONDANSETRON HCL 4 MG/2ML IJ SOLN: 4.0000 mg | Freq: Four times a day (QID) | INTRAMUSCULAR | Status: DC | PRN

## 2018-06-04 MED ORDER — OXYCODONE-ACETAMINOPHEN 5-325 MG PO TABS
1.0000 | ORAL_TABLET | Freq: Three times a day (TID) | ORAL | Status: DC
  Administered 2018-06-04 – 2018-06-05 (×2): 1 via ORAL
  Filled 2018-06-04 (×2): qty 1

## 2018-06-04 MED ORDER — UMECLIDINIUM-VILANTEROL 62.5-25 MCG/INH IN AEPB
1.0000 | INHALATION_SPRAY | Freq: Every day | RESPIRATORY_TRACT | Status: DC
  Administered 2018-06-05: 1 via RESPIRATORY_TRACT
  Filled 2018-06-04: qty 14

## 2018-06-04 NOTE — ED Notes (Addendum)
1st attempt to call report placed on hold by person named Serenity as per serenity patient unable to take report at present time.

## 2018-06-04 NOTE — Consult Note (Signed)
[facility] Face-to-Face Psychiatry Consult   Reason for Consult: Consult for this 83 year old man who came to the hospital with multiple somatic symptoms including syncope.  Consult for "anxiety" Referring Physician: [place] Patient Identification: Idriss Quackenbush MRN:  [id number] Principal Diagnosis: Depression, major, recurrent, moderate ([place]) Diagnosis:  Principal Problem:   Depression, major, recurrent, moderate ([place]) Active Problems:   Syncope and collapse   Somatic symptom disorder   Total Time spent with patient: 1 hour  Subjective:   Mikeal Winstanley is a 83 y.o. male patient admitted with "profuse sweating".  HPI: Patient seen chart reviewed.  Patient known from previous encounter.  83 year old man with chronic anxiety and depression problems came to the emergency room for the latest of several visits complaining of dizziness sweating general malaise weakness.  Patient was admitted to the hospital for work-up.  Patient tells me that his mood has been bad which he attributes entirely to his physically feeling bad.  His sleep is erratic with some nights of poor sleep.  He denies any suicidal thought.  Energy is chronically low.  Nerves are chronically bad but does not describe any specific panic attacks.  No evidence of psychotic symptoms.  [facility] they increased his venlafaxine to 150 mg/day but he has not actually made the change yet so he is still taking the same psychiatric medicine he was taking previously.  Chronic stresses of disliking where he is living but does not seem nearly as distressed as he did last time.  Social history: Patient is a recent transplant to our area having moved from [place] to be closer to his daughter.  Medical history: Overweight.  COPD.  Patient has bilateral partial blindness.  He has a history of multiple somatic complaints.  Comes into the emergency room quite frequently and by report used to do that in [place]  as well.  Substance abuse history: No evidence of current substance abuse  Past Psychiatric History: Long history evidently of complaints of depression and anxiety.  He was in our emergency room back in November after threatening to shoot himself and actually having a gun at home.  Sounds like these kind of dramatic threats have been a recurrent issue for him.  He takes chronic antidepressant medicine with maybe partial benefit.  He has had hospitalizations including one just a couple weeks ago.  No clear evidence of any psychosis in the past.  Risk to Self:   Risk to Others:   Prior Inpatient Therapy:   Prior Outpatient Therapy:    Past Medical History:  Past Medical History:  Diagnosis Date  . Anxiety   . Cancer ([place])   . COPD (chronic obstructive pulmonary disease) ([place])   . Glaucoma   . Osteoporosis    osteopenia    Past Surgical History:  Procedure Laterality Date  . CHOLECYSTECTOMY     Family History:  Family History  Problem Relation Age of Onset  . Depression Mother   . Heart disease Father    Family Psychiatric  History: None known Social History:  Social History   Substance and Sexual Activity  Alcohol Use Not Currently   Comment: past     Social History   Substance and Sexual Activity  Drug Use Never    Social History   Socioeconomic History  . Marital status: Divorced    Spouse name: Not on file  . Number of children: Not on file  . Years of education: Not on file  .  Highest education level: Not on file  Occupational History  . Occupation: retired  Scientific laboratory technician  . Financial resource strain: Not on file  . Food insecurity:    Worry: Not on file    Inability: Not on file  . Transportation needs:    Medical: Not on file    Non-medical: Not on file  Tobacco Use  . Smoking status: Former Smoker    Years: 15.00  . Smokeless tobacco: Current User    Types: Chew  Substance and Sexual Activity  . Alcohol use: Not Currently    Comment: past  .  Drug use: Never  . Sexual activity: Not Currently  Lifestyle  . Physical activity:    Days per week: Not on file    Minutes per session: Not on file  . Stress: Not on file  Relationships  . Social connections:    Talks on phone: Not on file    Gets together: Not on file    Attends religious service: Not on file    Active member of club or organization: Not on file    Attends meetings of clubs or organizations: Not on file    Relationship status: Not on file  Other Topics Concern  . Not on file  Social History Narrative  . Not on file   Additional Social History:    Allergies:   Allergies  Allergen Reactions  . Azithromycin Shortness Of Breath    Labs:  Results for orders placed or performed during the hospital encounter of 06/04/18 (from the past 48 hour(s))  Glucose, capillary     Status: Abnormal   Collection Time: 06/04/18 12:03 PM  Result Value Ref Range   Glucose-Capillary 133 (H) 70 - 99 mg/dL  Troponin I - ONCE - STAT     Status: None   Collection Time: 06/04/18 12:17 PM  Result Value Ref Range   Troponin I <0.03 <0.03 ng/mL    Comment: Performed at [facility], [address] [place], [place] [address]  CBC with Differential     Status: None   Collection Time: 06/04/18 12:17 PM  Result Value Ref Range   WBC 8.1 4.0 - 10.5 K/uL   RBC 5.12 4.22 - 5.81 MIL/uL   Hemoglobin 15.0 13.0 - 17.0 g/dL   HCT 45.7 39.0 - 52.0 %   MCV 89.3 80.0 - 100.0 fL   MCH 29.3 26.0 - 34.0 pg   MCHC 32.8 30.0 - 36.0 g/dL   RDW 14.1 11.5 - 15.5 %   Platelets 185 150 - 400 K/uL   nRBC 0.0 0.0 - 0.2 %   Neutrophils Relative % 71 %   Neutro Abs 5.8 1.7 - 7.7 K/uL   Lymphocytes Relative 15 %   Lymphs Abs 1.2 0.7 - 4.0 K/uL   Monocytes Relative 9 %   Monocytes Absolute 0.7 0.1 - 1.0 K/uL   Eosinophils Relative 4 %   Eosinophils Absolute 0.3 0.0 - 0.5 K/uL   Basophils Relative 1 %   Basophils Absolute 0.1 0.0 - 0.1 K/uL   Immature Granulocytes 0 %   Abs Immature  Granulocytes 0.03 0.00 - 0.07 K/uL    Comment: Performed at [facility], [address] [place],  [address]  Comprehensive metabolic panel     Status: Abnormal   Collection Time: 06/04/18 12:17 PM  Result Value Ref Range   Sodium 140 135 - 145 mmol/L   Potassium 3.6 3.5 - 5.1 mmol/L   Chloride 110 98 -  111 mmol/L   CO2 18 (L) 22 - 32 mmol/L   Glucose, Bld 121 (H) 70 - 99 mg/dL   BUN 23 8 - 23 mg/dL   Creatinine, Ser 0.84 0.61 - 1.24 mg/dL   Calcium 9.6 8.9 - 10.3 mg/dL   Total Protein 6.9 6.5 - 8.1 g/dL   Albumin 4.1 3.5 - 5.0 g/dL   AST 26 15 - 41 U/L   ALT 28 0 - 44 U/L   Alkaline Phosphatase 44 38 - 126 U/L   Total Bilirubin 0.8 0.3 - 1.2 mg/dL   GFR calc non Af Amer >60 >60 mL/min   GFR calc Af Amer >60 >60 mL/min   Anion gap 12 5 - 15    Comment: Performed at [facility], [address] [place], [place] [address]  Lipase, blood     Status: None   Collection Time: 06/04/18 12:17 PM  Result Value Ref Range   Lipase 24 11 - 51 U/L    Comment: Performed at [facility], [address] [place], [place] [address]  Urinalysis, Complete w Microscopic     Status: Abnormal   Collection Time: 06/04/18  3:20 PM  Result Value Ref Range   Color, Urine YELLOW (A) YELLOW   APPearance CLEAR (A) CLEAR   Specific Gravity, Urine 1.020 1.005 - 1.030   pH 5.0 5.0 - 8.0   Glucose, UA NEGATIVE NEGATIVE mg/dL   Hgb urine dipstick NEGATIVE NEGATIVE   Bilirubin Urine NEGATIVE NEGATIVE   Ketones, ur NEGATIVE NEGATIVE mg/dL   Protein, ur 30 (A) NEGATIVE mg/dL   Nitrite NEGATIVE NEGATIVE   Leukocytes, UA NEGATIVE NEGATIVE   RBC / HPF 0-5 0 - 5 RBC/hpf   WBC, UA 0-5 0 - 5 WBC/hpf   Bacteria, UA NONE SEEN NONE SEEN   Squamous Epithelial / LPF NONE SEEN 0 - 5   Mucus PRESENT     Comment: Performed at [facility], [address] [place], [place] [address]  Troponin I - Now Then Q6H     Status: None   Collection Time: 06/04/18  4:27 PM  Result  Value Ref Range   Troponin I <0.03 <0.03 ng/mL    Comment: Performed at [facility], [address] [place], [place] [address]    Current Facility-Administered Medications  Medication Dose Route Frequency Provider Last Rate Last Dose  . 0.9 %  sodium chloride infusion   Intravenous Continuous Saundra Shelling, MD 75 mL/hr at 06/04/18 1735    . acetaminophen (TYLENOL) tablet 650 mg  650 mg Oral Q6H PRN Saundra Shelling, MD       Or  . acetaminophen (TYLENOL) suppository 650 mg  650 mg Rectal Q6H PRN Pyreddy, Reatha Harps, MD      . enoxaparin (LOVENOX) injection 40 mg  40 mg Subcutaneous Q24H Pyreddy, Pavan, MD      . ondansetron (ZOFRAN) tablet 4 mg  4 mg Oral Q6H PRN Pyreddy, Reatha Harps, MD       Or  . ondansetron (ZOFRAN) injection 4 mg  4 mg Intravenous Q6H PRN Pyreddy, Pavan, MD      . pregabalin (LYRICA) capsule 100 mg  100 mg Oral BID Karlos Scadden, Madie Reno, MD      . senna-docusate (Senokot-S) tablet 1 tablet  1 tablet Oral QHS PRN Saundra Shelling, MD      . Derrill Memo ON 06/05/2018] venlafaxine XR (EFFEXOR-XR) 24 hr capsule 75 mg  75 mg Oral Q breakfast Amilliana Hayworth, Madie Reno, MD  Musculoskeletal: Strength & Muscle Tone: within normal limits Gait & Station: normal Patient leans: N/A  Psychiatric Specialty Exam: Physical Exam  Nursing note and vitals reviewed. Constitutional: He appears well-developed and well-nourished.  HENT:  Head: Normocephalic and atraumatic.  Eyes: Pupils are equal, round, and reactive to light. Conjunctivae are normal.  Neck: Normal range of motion.  Cardiovascular: Regular rhythm and normal heart sounds.  Respiratory: Effort normal. No respiratory distress.  GI: Soft.  Musculoskeletal: Normal range of motion.  Neurological: He is alert.  Skin: Skin is warm and dry.  Psychiatric: His speech is normal and behavior is normal. Judgment and thought content normal. His mood appears anxious. Cognition and memory are normal.    Review of Systems  Constitutional:  Positive for diaphoresis and malaise/fatigue.  HENT: Negative.   Eyes: Negative.   Respiratory: Negative.   Cardiovascular: Negative.   Gastrointestinal: Negative.   Musculoskeletal: Negative.   Skin: Negative.   Neurological: Negative.   Psychiatric/Behavioral: Negative for depression, hallucinations, substance abuse and suicidal ideas. The patient is nervous/anxious and has insomnia.     Blood pressure 119/64, pulse 81, temperature 98.4 F (36.9 C), temperature source Oral, resp. rate 18, height 5\' 10"  (1.778 m), weight 101.9 kg, SpO2 95 %.Body mass index is 32.23 kg/m.  General Appearance: Disheveled  Eye Contact:  Minimal  Speech:  Slow  Volume:  Decreased  Mood:  Dysphoric  Affect:  Constricted  Thought Process:  Goal Directed  Orientation:  Full (Time, Place, and Person)  Thought Content:  Logical  Suicidal Thoughts:  No  Homicidal Thoughts:  No  Memory:  Immediate;   Fair Recent;   Fair Remote;   Fair  Judgement:  Impaired  Insight:  Shallow  Psychomotor Activity:  Decreased  Concentration:  Concentration: Fair  Recall:  [employer] of Knowledge:  Fair  Language:  Fair  Akathisia:  No  Handed:  Right  AIMS (if indicated):     Assets:  Desire for Improvement Housing  ADL's:  Impaired  Cognition:  WNL  Sleep:        Treatment Plan Summary: Daily contact with patient to assess and evaluate symptoms and progress in treatment, Medication management and Plan 83 year old man with chronic complaints of depression and anxiety related to chronic somatic complaints.  Meets criteria for somatic symptom disorder just from the degree of worry and distress he is imparting to his chronic medical illnesses.  Patient actually looks like he is in much better spirits and is thinking more clearly than he was when he was here in November.  No evidence of acute dangerousness.  Does not require psychiatric hospitalization.  I have restarted his venlafaxine 75 mg a day and also the Lyrica  100 mg twice a day that he was taking previously.  I will follow as needed.  Disposition: No evidence of imminent risk to self or others at present.   Patient does not meet criteria for psychiatric inpatient admission. Supportive therapy provided about ongoing stressors.  Alethia Berthold, MD 06/04/2018 6:02 PM

## 2018-06-04 NOTE — H&P (Signed)
[facility] NAME: Damarion Mendizabal    MR#:  [id number]  DATE OF BIRTH:  31-Dec-1934  DATE OF ADMISSION:  06/04/2018  PRIMARY CARE PHYSICIAN: Olin Hauser, DO   REQUESTING/REFERRING PHYSICIAN:   CHIEF COMPLAINT:   Chief Complaint  Patient presents with  . Weakness  . Loss of Consciousness    HISTORY OF PRESENT ILLNESS: Vinod Mikesell  is a 83 y.o. male with a known history of anxiety disorder, COPD, osteoporosis, carcinoma presented to the emergency room for multiple episodes of passing out.  Patient states he feels nauseous sweaty before he passes out.  No history of any head injury.  Has home health worker who comes from 9 AM to 2 PM on weekdays.  No complaints of any chest pain, shortness of breath.  Hospitalist service was consulted for further care.  PAST MEDICAL HISTORY:   Past Medical History:  Diagnosis Date  . Anxiety   . Cancer ([place])   . COPD (chronic obstructive pulmonary disease) ([place])   . Glaucoma   . Osteoporosis    osteopenia    PAST SURGICAL HISTORY:  Past Surgical History:  Procedure Laterality Date  . CHOLECYSTECTOMY      SOCIAL HISTORY:  Social History   Tobacco Use  . Smoking status: Former Smoker    Years: 15.00  . Smokeless tobacco: Current User    Types: Chew  Substance Use Topics  . Alcohol use: Not Currently    Comment: past    FAMILY HISTORY:  Family History  Problem Relation Age of Onset  . Depression Mother   . Heart disease Father     DRUG ALLERGIES:  Allergies  Allergen Reactions  . Azithromycin Shortness Of Breath    REVIEW OF SYSTEMS:   CONSTITUTIONAL: No fever, fatigue or weakness.  EYES: No blurred or double vision.  EARS, NOSE, AND THROAT: No tinnitus or ear pain.  RESPIRATORY: No cough, shortness of breath, wheezing or hemoptysis.  CARDIOVASCULAR: No chest pain, orthopnea, edema.  GASTROINTESTINAL: Has nausea, no vomiting, diarrhea or  abdominal pain.  GENITOURINARY: No dysuria, hematuria.  ENDOCRINE: No polyuria, nocturia,  HEMATOLOGY: No anemia, easy bruising or bleeding SKIN: No rash or lesion. MUSCULOSKELETAL: No joint pain or arthritis.   NEUROLOGIC: No tingling, numbness, weakness.  PSYCHIATRY: No anxiety or depression.   MEDICATIONS AT HOME:  Prior to Admission medications   Medication Sig Start Date End Date Taking? Authorizing Provider  amLODipine (NORVASC) 10 MG tablet Take 10 mg by mouth daily.   Yes [provider]  aspirin EC 81 MG tablet Take 81 mg by mouth daily.   Yes [provider]  fenofibrate (TRICOR) 145 MG tablet Take 145 mg by mouth daily.   Yes [provider]  ferrous sulfate 325 (65 FE) MG tablet Take 325 mg by mouth daily with breakfast.   Yes [provider]  hydrochlorothiazide (MICROZIDE) 12.5 MG capsule Take 12.5 mg by mouth daily.   Yes [provider]  meloxicam (MOBIC) 7.5 MG tablet Take 7.5 mg by mouth 2 (two) times daily.   Yes [provider]  morphine (MS CONTIN) 15 MG 12 hr tablet Take 1 tablet (15 mg total) by mouth every 12 (twelve) hours. 05/01/18  Yes Karamalegos, Devonne Doughty, DO  Multiple Vitamins-Minerals (MULTIVITAL PO) Take by mouth.   Yes [provider]  nystatin (MYCOSTATIN/NYSTOP) powder Apply topically 4 (four) times daily. 05/29/18  Yes Delman Kitten, MD  oxyCODONE-acetaminophen [facility])  10-325 MG tablet Take 1 tablet by mouth 3 (three) times daily.   Yes [provider]  pantoprazole (PROTONIX) 40 MG tablet Take 40 mg by mouth 2 (two) times daily.   Yes [provider]  polyethylene glycol powder (GLYCOLAX/MIRALAX) powder Take 17 g by mouth daily as needed for moderate constipation. 05/09/18  Yes Harvest Dark, MD  pregabalin (LYRICA) 100 MG capsule Take 100 mg by mouth 4 (four) times daily.   Yes [provider]  senna (SENOKOT) 8.6 MG tablet Take 1 tablet by mouth daily.    Yes [provider]  timolol (BETIMOL) 0.25 % ophthalmic solution 1-2 drops 2 (two) times daily.   Yes [provider]  umeclidinium-vilanterol (ANORO ELLIPTA) 62.5-25 MCG/INH AEPB Inhale 1 puff into the lungs daily.   Yes [provider]  venlafaxine (EFFEXOR) 75 MG tablet Take 75 mg by mouth 2 (two) times daily.   Yes [provider]  doxycycline (VIBRAMYCIN) 100 MG capsule Take 100 mg by mouth 2 (two) times daily. For 10 days, every month, chronic pneumonia prophylaxis    [provider]      PHYSICAL EXAMINATION:   VITAL SIGNS: Blood pressure 131/67, pulse 87, temperature 98.4 F (36.9 C), resp. rate 14, height 5\' 10"  (1.778 m), weight 111 kg, SpO2 96 %.  GENERAL:  83 y.o.-year-old patient lying in the bed with no acute distress.  EYES: Pupils equal, round, reactive to light and accommodation. No scleral icterus. Extraocular muscles intact.  HEENT: Head atraumatic, normocephalic. Oropharynx dry and nasopharynx clear.  NECK:  Supple, no jugular venous distention. No thyroid enlargement, no tenderness.  LUNGS: Normal breath sounds bilaterally, no wheezing, rales,rhonchi or crepitation. No use of accessory muscles of respiration.  CARDIOVASCULAR: S1, S2 normal. No murmurs, rubs, or gallops.  ABDOMEN: Soft, nontender, nondistended. Bowel sounds present. No organomegaly or mass.  EXTREMITIES: No pedal edema, cyanosis, or clubbing.  NEUROLOGIC: Cranial nerves II through XII are intact. Muscle strength 5/5 in all extremities. Sensation intact. Gait not checked.  PSYCHIATRIC: The patient is alert and oriented x 3.  SKIN: No obvious rash, lesion, or ulcer.   LABORATORY PANEL:   CBC Recent Labs  Lab 05/29/18 1555 06/04/18 1217  WBC 7.2 8.1  HGB 16.2 15.0  HCT 51.5 45.7  PLT 194 185  MCV 94.5 89.3  MCH 29.7 29.3  MCHC 31.5 32.8  RDW 14.3 14.1  LYMPHSABS  --  1.2  MONOABS  --  0.7  EOSABS  --  0.3  BASOSABS  --  0.1    ------------------------------------------------------------------------------------------------------------------  Chemistries  Recent Labs  Lab 05/29/18 1555 06/04/18 1217  NA 143 140  K 4.1 3.6  CL 111 110  CO2 19* 18*  GLUCOSE 114* 121*  BUN 30* 23  CREATININE 0.99 0.84  CALCIUM 9.6 9.6  AST 50* 26  ALT 44 28  ALKPHOS 53 44  BILITOT 0.9 0.8   ------------------------------------------------------------------------------------------------------------------ estimated creatinine clearance is 86 mL/min (by C-G formula based on SCr of 0.84 mg/dL). ------------------------------------------------------------------------------------------------------------------ No results for input(s): TSH, T4TOTAL, T3FREE, THYROIDAB in the last 72 hours.  Invalid input(s): FREET3   Coagulation profile No results for input(s): INR, PROTIME in the last 168 hours. ------------------------------------------------------------------------------------------------------------------- No results for input(s): DDIMER in the last 72 hours. -------------------------------------------------------------------------------------------------------------------  Cardiac Enzymes Recent Labs  Lab 06/04/18 1217  TROPONINI <0.03   ------------------------------------------------------------------------------------------------------------------ Invalid input(s): POCBNP  ---------------------------------------------------------------------------------------------------------------  Urinalysis    Component Value Date/Time   COLORURINE AMBER (A) 05/29/2018 1555   APPEARANCEUR  CLEAR (A) 05/29/2018 1555   LABSPEC 1.036 (H) 05/29/2018 1555   PHURINE 5.0 05/29/2018 1555   GLUCOSEU NEGATIVE 05/29/2018 1555   HGBUR NEGATIVE 05/29/2018 [place] 05/29/2018 [place] 05/29/2018 1555   PROTEINUR 100 (A) 05/29/2018 1555   NITRITE NEGATIVE 05/29/2018 1555   LEUKOCYTESUR  NEGATIVE 05/29/2018 1555     RADIOLOGY: Dg Chest 2 View  Result Date: 06/04/2018 CLINICAL DATA:  Chest pain EXAM: CHEST - 2 VIEW COMPARISON:  05/29/2018 FINDINGS: Normal heart size and mediastinal contours. No acute infiltrate or edema. No effusion or pneumothorax. Ankylosing spondylitis with exaggerated thoracic kyphosis. IMPRESSION: No active cardiopulmonary disease. Ankylosing spondylitis. Electronically Signed   By: Monte Fantasia M.D.   On: 06/04/2018 13:14    EKG: Orders placed or performed during the hospital encounter of 06/04/18  . EKG 12-Lead  . EKG 12-Lead    IMPRESSION AND PLAN:  83 year old male patient with history of COPD, osteoporosis, anxiety disorder presented to the emergency room for multiple episodes of passing out.  -Syncope appears vasovagal in etiology Admit under observation for telemetry Cycle troponin Check echocardiogram and carotid ultrasound IV fluid hydration Check orthostatics  -Dehydration IV fluids  -COPD stable Home dose inhalers  -DVT prophylaxis subcu Lovenox daily  All the records are reviewed and case discussed with ED provider. Management plans discussed with the patient, family and they are in agreement.  CODE STATUS:Full code    TOTAL TIME TAKING CARE OF THIS PATIENT: 53 minutes.    Saundra Shelling M.D on 06/04/2018 at 2:38 PM  Between 7am to 6pm - Pager - [phone]  After 6pm go to [email or web address] - password [email or web address] [facility]  Office  [phone]  CC: Primary care physician; Olin Hauser, DO

## 2018-06-04 NOTE — ED Notes (Signed)
ED TO INPATIENT HANDOFF REPORT  Name/Age/Gender Aaron Gibbs 83 y.o. male  Code Status   Home/SNF/Other From home  Chief Complaint weakness  Level of Care/Admitting Diagnosis ED Disposition    ED Disposition Condition [facility] Area: [place] [[id number]]  Level of Care: Telemetry [5]  Diagnosis: Syncope and collapse [780.2.ICD-9-CM]  Admitting Physician: Saundra Shelling [[id number]]  Attending Physician: Saundra Shelling [[id number]]  PT Class (Do Not Modify): Observation [104]  PT Acc Code (Do Not Modify): Observation [10022]       Medical History Past Medical History:  Diagnosis Date  . Anxiety   . Cancer ([place])   . COPD (chronic obstructive pulmonary disease) ([place])   . Glaucoma   . Osteoporosis    osteopenia    Allergies Allergies  Allergen Reactions  . Azithromycin Shortness Of Breath    IV Location/Drains/Wounds Patient Lines/Drains/Airways Status   Active Line/Drains/Airways    Name:   Placement date:   Placement time:   Site:   Days:   Peripheral IV 06/04/18 Left Hand   06/04/18    1216    Hand   less than 1          Labs/Imaging Results for orders placed or performed during the hospital encounter of 06/04/18 (from the past 48 hour(s))  Glucose, capillary     Status: Abnormal   Collection Time: 06/04/18 12:03 PM  Result Value Ref Range   Glucose-Capillary 133 (H) 70 - 99 mg/dL  Troponin I - ONCE - STAT     Status: None   Collection Time: 06/04/18 12:17 PM  Result Value Ref Range   Troponin I <0.03 <0.03 ng/mL    Comment: Performed at [facility], [address] [place], [place] [address]  CBC with Differential     Status: None   Collection Time: 06/04/18 12:17 PM  Result Value Ref Range   WBC 8.1 4.0 - 10.5 K/uL   RBC 5.12 4.22 - 5.81 MIL/uL   Hemoglobin 15.0 13.0 - 17.0 g/dL   HCT 45.7 39.0 - 52.0 %   MCV 89.3 80.0 - 100.0 fL   MCH 29.3 26.0 - 34.0 pg   MCHC 32.8 30.0 - 36.0 g/dL   RDW 14.1  11.5 - 15.5 %   Platelets 185 150 - 400 K/uL   nRBC 0.0 0.0 - 0.2 %   Neutrophils Relative % 71 %   Neutro Abs 5.8 1.7 - 7.7 K/uL   Lymphocytes Relative 15 %   Lymphs Abs 1.2 0.7 - 4.0 K/uL   Monocytes Relative 9 %   Monocytes Absolute 0.7 0.1 - 1.0 K/uL   Eosinophils Relative 4 %   Eosinophils Absolute 0.3 0.0 - 0.5 K/uL   Basophils Relative 1 %   Basophils Absolute 0.1 0.0 - 0.1 K/uL   Immature Granulocytes 0 %   Abs Immature Granulocytes 0.03 0.00 - 0.07 K/uL    Comment: Performed at [facility], [address] [place], [place] [address]  Comprehensive metabolic panel     Status: Abnormal   Collection Time: 06/04/18 12:17 PM  Result Value Ref Range   Sodium 140 135 - 145 mmol/L   Potassium 3.6 3.5 - 5.1 mmol/L   Chloride 110 98 - 111 mmol/L   CO2 18 (L) 22 - 32 mmol/L   Glucose, Bld 121 (H) 70 - 99 mg/dL   BUN 23 8 - 23 mg/dL   Creatinine, Ser 0.84 0.61 - 1.24 mg/dL  Calcium 9.6 8.9 - 10.3 mg/dL   Total Protein 6.9 6.5 - 8.1 g/dL   Albumin 4.1 3.5 - 5.0 g/dL   AST 26 15 - 41 U/L   ALT 28 0 - 44 U/L   Alkaline Phosphatase 44 38 - 126 U/L   Total Bilirubin 0.8 0.3 - 1.2 mg/dL   GFR calc non Af Amer >60 >60 mL/min   GFR calc Af Amer >60 >60 mL/min   Anion gap 12 5 - 15    Comment: Performed at [facility], [address] [place], [place] [address]  Lipase, blood     Status: None   Collection Time: 06/04/18 12:17 PM  Result Value Ref Range   Lipase 24 11 - 51 U/L    Comment: Performed at [facility], [address], [place], [place] [address]   Dg Chest 2 View  Result Date: 06/04/2018 CLINICAL DATA:  Chest pain EXAM: CHEST - 2 VIEW COMPARISON:  05/29/2018 FINDINGS: Normal heart size and mediastinal contours. No acute infiltrate or edema. No effusion or pneumothorax. Ankylosing spondylitis with exaggerated thoracic kyphosis. IMPRESSION: No active cardiopulmonary disease. Ankylosing spondylitis. Electronically Signed   By: Monte Fantasia  M.D.   On: 06/04/2018 13:14    Pending Labs Unresulted Labs (From admission, onward)    Start     Ordered   06/04/18 1224  Urinalysis, Complete w Microscopic  Once,   STAT     06/04/18 1223   Signed and Held  CBC  (enoxaparin (LOVENOX)    CrCl >/= 30 ml/min)  Once,   R    Comments:  Baseline for enoxaparin therapy IF NOT ALREADY DRAWN.  Notify MD if PLT < 100 K.    Signed and Held   Signed and Held  Creatinine, serum  (enoxaparin (LOVENOX)    CrCl >/= 30 ml/min)  Once,   R    Comments:  Baseline for enoxaparin therapy IF NOT ALREADY DRAWN.    Signed and Held   Signed and Held  Creatinine, serum  (enoxaparin (LOVENOX)    CrCl >/= 30 ml/min)  Weekly,   R    Comments:  while on enoxaparin therapy    Signed and Held   Signed and Held  Troponin I - Now Then Q6H  Now then every 6 hours,   R     Signed and Held   Signed and Held  Basic metabolic panel  Tomorrow morning,   R     Signed and Held   Signed and Held  CBC  Tomorrow morning,   R     Signed and Held          Vitals/Pain Today's Vitals   06/04/18 1215 06/04/18 1230 06/04/18 1245 06/04/18 1446  BP: 125/71 117/74 131/67 128/74  Pulse:  69 87 74  Resp: (!) 36 18 14 18   Temp:    98.6 F (37 C)  TempSrc:    Oral  SpO2:  93% 96% 95%  Weight:      Height:      PainSc:    7     Isolation Precautions No active isolations  Medications Medications - No data to display  Mobility Ambulates at home

## 2018-06-04 NOTE — Care Management Note (Signed)
Case Management Note  Patient Details  Name: Aaron Gibbs MRN: [id number] Date of Birth: 1935/01/12  Subjective/Objective:     Patient is being placed in observation status for weakness and syncope.  Patient reports progressive weakness and falling multiple times over the past 4 days.  Patient lives alone in an apartment in [place].  Patient moved from [place] to [place] in October of this year to be closer to his daughters.  Patient states his daughters live in [place] right down the block.  Patient does not drive but does have people that can assist him.  Patient has [facility] [employer] [facility] coming out to help him every day for 4 hours, they can also provide transportation.  Patient verifies PCP as correct in Epic and uses [facility].  Patient is interested in Rehab but his ultimate goal is to go back home.  Social Worker consult has been placed.  RNCM will cont to follow. Doran Clay RN BSN [phone]          Action/Plan:   Expected Discharge Date:                  Expected Discharge Plan:     In-House Referral:     Discharge planning Services     Post Acute Care Choice:    Choice offered to:     DME Arranged:    DME Agency:     HH Arranged:    HH Agency:     Status of Service:     If discussed at [place] of Stay Meetings, dates discussed:    Additional Comments:  Shelbie Hutching, RN 06/04/2018, 2:34 PM

## 2018-06-04 NOTE — Clinical Social Work Note (Signed)
CSW received consult that patient may need SNF placement, CSW awaiting PT recommendations.  Patient will need insurance authorization.  Jones Broom. [place], MSW, [place]  06/04/2018 5:59 PM

## 2018-06-04 NOTE — Progress Notes (Signed)
Advanced care plan.  Purpose of the Encounter: CODE STATUS  Parties in Attendance: Patient  Patient's Decision Capacity: Good  Subjective/Patient's story: Presented to the emergency room for passing out multiple times   Objective/Medical story Patient has multiple episodes of syncope but no known cause Needs evaluation   Goals of care determination:  Advance care directives goals of care treatment plan discussed Patient wants everything done which includes CPR, intubation ventilator if the need arises   CODE STATUS: Full code   Time spent discussing advanced care planning: 16 minutes

## 2018-06-04 NOTE — ED Notes (Signed)
Urine specimen obtained and sent.

## 2018-06-04 NOTE — Progress Notes (Signed)
Patient admitted to 2A room 236 from ED via stretcher.  Report called and given to this RN.  Patient oriented to floor and surroundings. Telemetry box placed. Call bell in reach.  Bed alarm on for safety.

## 2018-06-04 NOTE — ED Notes (Signed)
Chest xray completed. Patient reorts hunger. Lunch tray provided.

## 2018-06-04 NOTE — ED Notes (Signed)
BLOOD Glucose 133

## 2018-06-04 NOTE — Care Management Obs Status (Signed)
[facility] NOTIFICATION   Patient Details  Name: Aaron Gibbs MRN: [id number] Date of Birth: [birth year]   Medicare Observation Status Notification Given:  Yes    Shelbie Hutching, RN 06/04/2018, 2:34 PM

## 2018-06-04 NOTE — ED Provider Notes (Signed)
[facility] Emergency Department Provider Note  ____________________________________________   First MD Initiated Contact with Patient 06/04/18 1212     (approximate)  I have reviewed the triage vital signs and the nursing notes.   HISTORY  Chief Complaint Weakness and Loss of Consciousness   HPI Aaron Gibbs is a 83 y.o. male with a history of DDH as well as anxiety and COPD who is presenting to the emergency department with diaphoresis as well as multiple syncopal episodes over the past 4 days.  He has been to the emergency department 5 times over the past 1 month for medical and psychiatric complaints.  His complaints have included chest pain, abdominal pain as well as suicidal ideation.  He says that he continues to have intermittent abdominal pain but is not having any pain at this time.  Denies any chest pain.  Says that he has passed out 3-4 times over the past 4 days in his apartment.  Says that he feels dizzy/lightheaded prior to passing out.  No nausea or vomiting.  Denies any cough or shortness of breath at this time.  Says that he also has not moved his bowels over the past 4 days but does not feel like he has to go.  Patient is a history of opiate-induced constipation.  States that when he did move his bowels several days ago that he thought that he saw bright red blood in the stool.   Past Medical History:  Diagnosis Date  . Anxiety   . Cancer ([place])   . COPD (chronic obstructive pulmonary disease) ([place])   . Glaucoma   . Osteoporosis    osteopenia    Patient Active Problem List   Diagnosis Date Noted  . Severe major depression, single episode, without psychotic features ([place]) 05/10/2018  . Blindness 05/10/2018  . Suicidal ideation 05/10/2018  . Chronic pain syndrome 05/01/2018  . DISH (diffuse idiopathic skeletal hyperostosis) 05/01/2018  . Osteoarthritis of multiple joints 05/01/2018  . Bilateral chronic knee pain 05/01/2018  .  Retinitis pigmentosa of both eyes 05/01/2018  . Glaucoma of both eyes 05/01/2018  . Essential hypertension 05/01/2018  . Chronic low back pain 05/01/2018  . GERD (gastroesophageal reflux disease) 05/01/2018  . 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 Therapeutic opioid-induced constipation (OIC) 05/01/2018  . Centrilobular emphysema ([place]) 05/01/2018    Past Surgical History:  Procedure Laterality Date  . CHOLECYSTECTOMY      Prior to Admission medications   Medication Sig Start Date End Date Taking? Authorizing Provider  albuterol (PROVENTIL HFA) 108 (90 Base) MCG/ACT inhaler Inhale 2 puffs into the lungs every 4 (four) hours as needed for wheezing or shortness of breath. 05/26/18   Carrie Mew, MD  amLODipine (NORVASC) 10 MG tablet Take 10 mg by mouth daily.    [provider]  aspirin EC 81 MG tablet Take 81 mg by mouth daily.    [provider]  doxycycline (VIBRAMYCIN) 100 MG capsule Take 100 mg by mouth 2 (two) times daily. For 10 days, every month, chronic pneumonia prophylaxis    [provider]  fenofibrate (TRICOR) 145 MG tablet Take 145 mg by mouth daily.    [provider]  ferrous sulfate 325 (65 FE) MG tablet Take 325 mg by mouth daily with breakfast.    [provider]  hydrochlorothiazide (MICROZIDE) 12.5 MG capsule Take 12.5 mg by mouth daily.    [provider]  meloxicam (MOBIC) 7.5 MG tablet Take 7.5 mg  by mouth 2 (two) times daily.    [provider]  morphine (MS CONTIN) 15 MG 12 hr tablet Take 1 tablet (15 mg total) by mouth every 12 (twelve) hours. 05/01/18   Karamalegos, Devonne Doughty, DO  Multiple Vitamins-Minerals (MULTIVITAL PO) Take by mouth.    [provider]  nystatin (MYCOSTATIN/NYSTOP) powder Apply topically 4 (four) times daily. 05/29/18   Delman Kitten, MD  oxyCODONE-acetaminophen (PERCOCET) 10-325 MG tablet Take 1 tablet by mouth 3 (three) times daily.    [provider]  pantoprazole (PROTONIX) 40 MG tablet Take 40 mg by mouth 2 (two) times daily.    [provider]  polyethylene glycol powder (GLYCOLAX/MIRALAX) powder Take 17 g by mouth daily as needed for moderate constipation. 05/09/18   Harvest Dark, MD  pregabalin (LYRICA) 100 MG capsule Take 100 mg by mouth 4 (four) times daily.    [provider]  senna (SENOKOT) 8.6 MG tablet Take 1 tablet by mouth daily.    [provider]  timolol (BETIMOL) 0.25 % ophthalmic solution 1-2 drops 2 (two) times daily.    [provider]  umeclidinium-vilanterol (ANORO ELLIPTA) 62.5-25 MCG/INH AEPB Inhale 1 puff into the lungs daily.    [provider]  venlafaxine (EFFEXOR) 75 MG tablet Take 75 mg by mouth 2 (two) times daily.    [provider]    Allergies Azithromycin  Family History  Problem Relation Age of Onset  . Depression Mother   . Heart disease Father     Social History Social History   Tobacco Use  . Smoking status: Former Smoker    Years: 15.00  . Smokeless tobacco: Current User    Types: Chew  Substance Use Topics  . Alcohol use: Not Currently    Comment: past  . Drug use: Never    Review of Systems  Constitutional: No fever/chills Eyes: No visual changes. ENT: No sore throat. Cardiovascular: Denies chest pain. Respiratory: Denies shortness of breath. Gastrointestinal:  No nausea, no vomiting.  No diarrhea.  Genitourinary: Negative for dysuria. Musculoskeletal: Negative for back pain. Skin: Negative for rash. Neurological: Negative for headaches, focal weakness or numbness.   ____________________________________________   PHYSICAL EXAM:  VITAL SIGNS: ED Triage Vitals  Enc Vitals Group     BP 06/04/18 1204 123/86     Pulse Rate 06/04/18 1204 96     Resp 06/04/18 1204 (!) 21     Temp 06/04/18 1208 98.4 F (36.9 C)     Temp Source 06/04/18 1204 Oral     SpO2 06/04/18 1204 99 %     Weight 06/04/18  1205 244 lb 11.4 oz (111 kg)     Height 06/04/18 1205 5\' 10"  (1.778 m)     Head Circumference --      Peak Flow --      Pain Score 06/04/18 1205 8     Pain Loc --      Pain Edu? --      Excl. in [place]? --     Constitutional: Alert and oriented.  Short is wet with the patient sweat.  However, not acutely diaphoretic at this time. Eyes: Conjunctivae are normal.  Head: Atraumatic. Nose: No congestion/rhinnorhea. Mouth/Throat: Mucous membranes are moist.  Neck: No stridor.   Cardiovascular: Normal rate, regular rhythm. Grossly normal heart sounds.   Respiratory: Normal respiratory effort.  No retractions. Lungs CTAB. Gastrointestinal: Soft and nontender. No distention.  On gross rectal exam the patient has no hemorrhoids externally.  Digital rectal exam with minimal stool in the glove without any blood.  Negative Hemoccult test. Musculoskeletal: No lower extremity tenderness nor edema.  No joint effusions. Neurologic:  Normal speech and language. No gross focal neurologic deficits are appreciated. Skin:  Skin is warm, dry and intact. No rash noted. Psychiatric: Mood and affect are normal. Speech and behavior are normal.  ____________________________________________   LABS (all labs ordered are listed, but only abnormal results are displayed)  Labs Reviewed  GLUCOSE, CAPILLARY - Abnormal; Notable for the following components:      Result Value   Glucose-Capillary 133 (*)    All other components within normal limits  CBC WITH DIFFERENTIAL/PLATELET  TROPONIN I  COMPREHENSIVE METABOLIC PANEL  LIPASE, BLOOD  URINALYSIS, COMPLETE (UACMP) WITH MICROSCOPIC   ____________________________________________  EKG  ED ECG REPORT I, Doran Stabler, the attending physician, personally viewed and interpreted this ECG.   Date: 06/04/2018  EKG Time: 1204  Rate: 95  Rhythm: normal sinus rhythm  Axis: Normal  Intervals:none  ST&T Change: No ST segment ovation or depression.  No abnormal T  wave inversion.  ____________________________________________  RADIOLOGY  Chest x-ray today without acute pathology. ____________________________________________   PROCEDURES  Procedure(s) performed:   Procedures  Critical Care performed:   ____________________________________________   INITIAL IMPRESSION / ASSESSMENT AND PLAN / ED COURSE  Pertinent labs & imaging results that were available during my care of the patient were reviewed by me and considered in my medical decision making (see chart for details).  DDX: Vasovagal syncope, arrhythmia, anemia, electrolyte abnormality, UTI, COPD exacerbation, constipation As part of my medical decision making, I reviewed the following data within the [facility] Notes from prior ED visits Recent chest abdomen and pelvis CTA without any acute abnormality.  Has a noted, stable, 4.2 cm infrarenal abdominal aortic aneurysm.  Recommended follow-up in 1 year.  CT head without acute process.  Chest x-ray on 11 December with patchy opacity in the lingula.  ----------------------------------------- 1:38 PM on 06/04/2018 -----------------------------------------  Patient at this time is sitting up eating in bed without issue.  No distress noted.  His daughter as well as his caregiver is now the bedside.  His daughter states that the patient recently moved from [place].  She says that he frequented hospitals in [place] for constipation.  Says that he has a known alcohol as well as drug abuser including prescription pain as well as sleeping medications.  Patient has been in the emergency department 5 times over the past 1 month.  Now complaining of syncope.  Presents also with diaphoresis.  Possible vasovagal.  However, cannot rule out cardiac cause at this time.  Patient to be admitted for syncope work-up.  However, I will also consult social work as well as psychiatry as the patient has a long psychiatric history per the patient's  daughter.  Multiple threats of suicide in the past.  Recent psychiatric admission within the past month as well.  Signed out to Dr. Estanislado Pandy. ____________________________________________   FINAL CLINICAL IMPRESSION(S) / ED DIAGNOSES  Syncope.  NEW MEDICATIONS STARTED DURING THIS VISIT:  New Prescriptions   No medications on file     Note:  This document was prepared using Dragon voice recognition software and may include unintentional dictation errors.     Orbie Pyo, MD 06/04/18 1340

## 2018-06-04 NOTE — ED Notes (Signed)
Patient self medicating himself with eye drops. Reports eye drops are for dry eyes.

## 2018-06-04 NOTE — ED Notes (Addendum)
Patient out of bed into chair, dressing himself in clothes from home. Refusing to keep gown on, states he was cold. Patient took him self off cardiac monitor and pulse ox. States he can not wear his clothes with those things on. Patient awaiting echo andf bed status.

## 2018-06-04 NOTE — ED Notes (Signed)
Labs sents

## 2018-06-04 NOTE — ED Notes (Signed)
Patient daughter to speak  With Md, patient awaiting psych social worker consults.

## 2018-06-04 NOTE — ED Notes (Signed)
Ortho statics obtained/. Patient unable to stand for long without feeling SOB.

## 2018-06-04 NOTE — ED Notes (Signed)
Report given to stephanie RN.

## 2018-06-04 NOTE — ED Triage Notes (Signed)
Feeling weak and fatigued, reports having a few syncope episodes at home this week. Denies any injuries. C/o of generalized body pain and weakness.

## 2018-06-05 ENCOUNTER — Observation Stay (HOSPITAL_BASED_OUTPATIENT_CLINIC_OR_DEPARTMENT_OTHER)
Admit: 2018-06-05 | Discharge: 2018-06-05 | Disposition: A | Discharge status: Home / Self Care | Payer: Medicare Other | Attending: Internal Medicine | Admitting: Internal Medicine

## 2018-06-05 ENCOUNTER — Ambulatory Visit: Payer: Medicare Other | Admitting: Nurse Practitioner

## 2018-06-05 DIAGNOSIS — R55 Syncope and collapse: Secondary | ICD-10-CM

## 2018-06-05 LAB — BASIC METABOLIC PANEL
Anion gap: 6 (ref 5–15)
BUN: 24 mg/dL — ABNORMAL HIGH (ref 8–23)
CO2: 24 mmol/L (ref 22–32)
CREATININE: 1.02 mg/dL (ref 0.61–1.24)
Calcium: 8.9 mg/dL (ref 8.9–10.3)
Chloride: 109 mmol/L (ref 98–111)
GFR calc Af Amer: >60 mL/min (ref >60)
GFR calc non Af Amer: >60 mL/min (ref >60)
Glucose, Bld: 102 mg/dL — ABNORMAL HIGH (ref 70–99)
Potassium: 3.5 mmol/L (ref 3.5–5.1)
Sodium: 139 mmol/L (ref 135–145)

## 2018-06-05 LAB — CBC
HCT: 42.5 % (ref 39.0–52.0)
Hemoglobin: 13.7 g/dL (ref 13.0–17.0)
MCH: 29.3 pg (ref 26.0–34.0)
MCHC: 32.2 g/dL (ref 30.0–36.0)
MCV: 90.8 fL (ref 80.0–100.0)
PLATELETS: 178 10*3/uL (ref 150–400)
RBC: 4.68 MIL/uL (ref 4.22–5.81)
RDW: 14.4 % (ref 11.5–15.5)
WBC: 5.4 10*3/uL (ref 4.0–10.5)
nRBC: 0 % (ref 0.0–0.2)

## 2018-06-05 LAB — ECHOCARDIOGRAM COMPLETE
Height: 70 in
Weight: 3614.4 oz

## 2018-06-05 LAB — TROPONIN I: Troponin I: <0.03 ng/mL (ref <0.03)

## 2018-06-05 MED ORDER — PERFLUTREN LIPID MICROSPHERE
1.0000 mL | INTRAVENOUS | Status: AC | PRN
  Administered 2018-06-05: 3 mL via INTRAVENOUS
  Filled 2018-06-05: qty 10

## 2018-06-05 NOTE — Care Management Note (Signed)
Case Management Note  Patient Details  Name: Daryan Buell MRN: [id number] Date of Birth: [birth year]  Subjective/Objective:        Patient discharging to home.  Declines any home health services at this time.     He has [facility] 4 hours per day and is happy with that for now.           Action/Plan:   Expected Discharge Date:  06/05/18               Expected Discharge Plan:  Home/Self Care  In-House Referral:     Discharge planning Services  CM Consult  Post Acute Care Choice:    Choice offered to:     DME Arranged:    DME Agency:     HH Arranged:    HH Agency:     Status of Service:  Completed, signed off  If discussed at [employer] of Stay Meetings, dates discussed:    Additional Comments:  Elza Rafter, RN 06/05/2018, 3:17 PM

## 2018-06-05 NOTE — Discharge Instructions (Signed)
Fall precaution. °HHPT °

## 2018-06-05 NOTE — Progress Notes (Signed)
Patient discharged to home. Tele and IV removed by patient earlier today prior to discharge and kept out.  Patient verbalizes understanding of discharge orders.

## 2018-06-05 NOTE — Progress Notes (Signed)
*  PRELIMINARY RESULTS* Echocardiogram 2D Echocardiogram has been performed. Definity image enhancer was administered.  Aaron Gibbs 06/05/2018, 11:11 AM

## 2018-06-05 NOTE — Discharge Summary (Signed)
[name] [place] NAME: Aaron Gibbs    MR#:  [id number]  DATE OF BIRTH:  1935-06-01  DATE OF ADMISSION:  06/04/2018   ADMITTING PHYSICIAN: Saundra Shelling, MD  DATE OF DISCHARGE: 06/05/2018  3:23 PM  PRIMARY CARE PHYSICIAN: Olin Hauser, DO   ADMISSION DIAGNOSIS:  Syncope and collapse [R55] DISCHARGE DIAGNOSIS:  Principal Problem:   Depression, major, recurrent, moderate (HCC) Active Problems:   Syncope and collapse   Somatic symptom disorder  SECONDARY DIAGNOSIS:   Past Medical History:  Diagnosis Date  . Anxiety   . Cancer ([place])   . COPD (chronic obstructive pulmonary disease) ([place])   . Glaucoma   . Osteoporosis    osteopenia   HOSPITAL COURSE:  83 year old male patient with history of COPD, osteoporosis, anxiety disorder presented to the emergency room for multiple episodes of passing out.  -Syncope appears vasovagal in etiology Normal troponin Check echocardiogram report is pending and carotid ultrasound is unremarkable.  Follow-up echo report with PCP.  -Dehydration Treated with IV fluids, hold HCTZ.  -COPD stable Home dose inhalers  Generalized weakness.  The patient need home health and PT. DISCHARGE CONDITIONS:  Stable, discharged to home with HHPT today. CONSULTS OBTAINED:  Treatment Team:  Clapacs, Madie Reno, MD DRUG ALLERGIES:   Allergies  Allergen Reactions  . Azithromycin Shortness Of Breath   DISCHARGE MEDICATIONS:   Allergies as of 06/05/2018      Reactions   Azithromycin Shortness Of Breath      Medication List    STOP taking these medications   hydrochlorothiazide 12.5 MG capsule Commonly known as:  MICROZIDE     TAKE these medications   amLODipine 10 MG tablet Commonly known as:  NORVASC Take 10 mg by mouth daily.   ANORO ELLIPTA 62.5-25 MCG/INH Aepb Generic drug:  umeclidinium-vilanterol Inhale 1 puff into the lungs daily.   aspirin EC 81 MG tablet Take  81 mg by mouth daily.   doxycycline 100 MG capsule Commonly known as:  VIBRAMYCIN Take 100 mg by mouth 2 (two) times daily. For 10 days, every month, chronic pneumonia prophylaxis   fenofibrate 145 MG tablet Commonly known as:  TRICOR Take 145 mg by mouth daily.   ferrous sulfate 325 (65 FE) MG tablet Take 325 mg by mouth daily with breakfast.   meloxicam 7.5 MG tablet Commonly known as:  MOBIC Take 7.5 mg by mouth 2 (two) times daily.   morphine 15 MG 12 hr tablet Commonly known as:  MS CONTIN Take 1 tablet (15 mg total) by mouth every 12 (twelve) hours.   MULTIVITAL PO Take by mouth.   nystatin powder Commonly known as:  MYCOSTATIN/NYSTOP Apply topically 4 (four) times daily.   oxyCODONE-acetaminophen 10-325 MG tablet Commonly known as:  PERCOCET Take 1 tablet by mouth 3 (three) times daily.   pantoprazole 40 MG tablet Commonly known as:  PROTONIX Take 40 mg by mouth 2 (two) times daily.   polyethylene glycol powder powder Commonly known as:  GLYCOLAX/MIRALAX Take 17 g by mouth daily as needed for moderate constipation.   pregabalin 100 MG capsule Commonly known as:  LYRICA Take 100 mg by mouth 4 (four) times daily.   senna 8.6 MG tablet Commonly known as:  SENOKOT Take 1 tablet by mouth daily.   timolol 0.25 % ophthalmic solution Commonly known as:  BETIMOL 1-2 drops 2 (two) times daily.   venlafaxine 75 MG tablet Commonly known as:  EFFEXOR Take  75 mg by mouth 2 (two) times daily.        DISCHARGE INSTRUCTIONS:  See AVS.  If you experience worsening of your admission symptoms, develop shortness of breath, life threatening emergency, suicidal or homicidal thoughts you must seek medical attention immediately by calling 911 or calling your MD immediately  if symptoms less severe.  You Must read complete instructions/literature along with all the possible adverse reactions/side effects for all the Medicines you take and that have been prescribed to  you. Take any new Medicines after you have completely understood and accpet all the possible adverse reactions/side effects.   Please note  You were cared for by a hospitalist during your hospital stay. If you have any questions about your discharge medications or the care you received while you were in the hospital after you are discharged, you can call the unit and asked to speak with the hospitalist on call if the hospitalist that took care of you is not available. Once you are discharged, your primary care physician will handle any further medical issues. Please note that NO REFILLS for any discharge medications will be authorized once you are discharged, as it is imperative that you return to your primary care physician (or establish a relationship with a primary care physician if you do not have one) for your aftercare needs so that they can reassess your need for medications and monitor your lab values.    On the day of Discharge:  VITAL SIGNS:  Blood pressure (!) 128/58, pulse 60, temperature 98.3 F (36.8 C), resp. rate 20, height 5\' 10"  (1.778 m), weight 102.5 kg, SpO2 94 %. PHYSICAL EXAMINATION:  GENERAL:  83 y.o.-year-old patient lying in the bed with no acute distress.  EYES: Pupils equal, round, reactive to light and accommodation. No scleral icterus. Extraocular muscles intact.  HEENT: Head atraumatic, normocephalic. Oropharynx and nasopharynx clear.  NECK:  Supple, no jugular venous distention. No thyroid enlargement, no tenderness.  LUNGS: Normal breath sounds bilaterally, no wheezing, rales,rhonchi or crepitation. No use of accessory muscles of respiration.  CARDIOVASCULAR: S1, S2 normal. No murmurs, rubs, or gallops.  ABDOMEN: Soft, non-tender, non-distended. Bowel sounds present. No organomegaly or mass.  EXTREMITIES: No pedal edema, cyanosis, or clubbing.  NEUROLOGIC: Cranial nerves II through XII are intact. Muscle strength 5/5 in all extremities. Sensation intact. Gait not  checked.  PSYCHIATRIC: The patient is alert and oriented x 3.  SKIN: No obvious rash, lesion, or ulcer.  DATA REVIEW:   CBC Recent Labs  Lab 06/05/18 0424  WBC 5.4  HGB 13.7  HCT 42.5  PLT 178    Chemistries  Recent Labs  Lab 06/04/18 1217 06/05/18 0424  NA 140 139  K 3.6 3.5  CL 110 109  CO2 18* 24  GLUCOSE 121* 102*  BUN 23 24*  CREATININE 0.84 1.02  CALCIUM 9.6 8.9  AST 26  --   ALT 28  --   ALKPHOS 44  --   BILITOT 0.8  --      Microbiology Results  Results for orders placed or performed during the hospital encounter of 05/29/18  Urine Culture     Status: Abnormal   Collection Time: 05/29/18  5:13 PM  Result Value Ref Range Status   Specimen Description   Final    URINE, RANDOM Performed at [facility], [address] [place], [place] [address]    Special Requests   Final    Normal Performed at [facility], [address] [place], [place] [address]    Culture (A)  Final    <10,000 COLONIES/mL INSIGNIFICANT GROWTH Performed at [facility] [address], [place], [place] [address]    Report Status 05/31/2018 FINAL  Final    RADIOLOGY:  US Carotid Bilateral  Result Date: 06/04/2018 CLINICAL DATA:  Syncope and collapse EXAM: BILATERAL CAROTID DUPLEX ULTRASOUND TECHNIQUE: Pearline Cables scale imaging, color Doppler and duplex ultrasound were performed of bilateral carotid and vertebral arteries in the neck. COMPARISON:  None. FINDINGS: Criteria: Quantification of carotid stenosis is based on velocity parameters that correlate the residual internal carotid diameter with NASCET-based stenosis levels, using the diameter of the distal internal carotid lumen as the denominator for stenosis measurement. The following velocity measurements were obtained: RIGHT ICA: 68/15 cm/sec CCA: 96/78 cm/sec SYSTOLIC ICA/CCA RATIO:  93.8 ECA: 75 cm/sec LEFT ICA: 61/22 cm/sec CCA: 10/17 cm/sec SYSTOLIC ICA/CCA RATIO:  0.9 ECA: 64 cm/sec RIGHT CAROTID  ARTERY: Mild plaque in the carotid bulb and at the ICA origin. No high-grade stenosis. Normal waveforms and color Doppler signal. RIGHT VERTEBRAL ARTERY:  Normal flow direction and waveform. LEFT CAROTID ARTERY: Minimal plaque in the distal common carotid artery and bulb. No significant stenosis. Normal waveforms and color Doppler signal. LEFT VERTEBRAL ARTERY:  Normal flow direction and waveform. IMPRESSION: 1. Mild bilateral carotid bifurcation plaque resulting in less than 50% diameter stenosis. 2.  Antegrade bilateral vertebral arterial flow. Electronically Signed   By: Lucrezia Europe M.D.   On: 06/04/2018 17:21     Management plans discussed with the patient, family and they are in agreement.  CODE STATUS: Full Code   TOTAL TIME TAKING CARE OF THIS PATIENT: 25 minutes.    Demetrios Loll M.D on 06/05/2018 at 4:13 PM  Between 7am to 6pm - Pager - [phone]  After 6pm go to [email or web address] - Proofreader  [facility]  Office  [phone]  CC: Primary care physician; Olin Hauser, DO   Note: This dictation was prepared with Dragon dictation along with smaller phrase technology. Any transcriptional errors that result from this process are unintentional.

## 2018-06-10 ENCOUNTER — Emergency Department
Admission: EM | Admit: 2018-06-10 | Discharge: 2018-06-11 | Disposition: A | Discharge status: Home / Self Care | Payer: Medicare Other | Attending: Emergency Medicine | Admitting: Emergency Medicine

## 2018-06-10 ENCOUNTER — Emergency Department: Payer: Medicare Other

## 2018-06-10 ENCOUNTER — Encounter: Payer: Self-pay | Admitting: Emergency Medicine

## 2018-06-10 ENCOUNTER — Other Ambulatory Visit: Payer: Self-pay

## 2018-06-10 DIAGNOSIS — R61 Generalized hyperhidrosis: Secondary | ICD-10-CM | POA: Insufficient documentation

## 2018-06-10 DIAGNOSIS — I10 Essential (primary) hypertension: Secondary | ICD-10-CM | POA: Diagnosis not present

## 2018-06-10 DIAGNOSIS — Z859 Personal history of malignant neoplasm, unspecified: Secondary | ICD-10-CM | POA: Diagnosis not present

## 2018-06-10 DIAGNOSIS — H5789 Other specified disorders of eye and adnexa: Secondary | ICD-10-CM | POA: Diagnosis not present

## 2018-06-10 DIAGNOSIS — R0602 Shortness of breath: Secondary | ICD-10-CM | POA: Insufficient documentation

## 2018-06-10 DIAGNOSIS — Z7982 Long term (current) use of aspirin: Secondary | ICD-10-CM | POA: Diagnosis not present

## 2018-06-10 DIAGNOSIS — N23 Unspecified renal colic: Secondary | ICD-10-CM | POA: Diagnosis not present

## 2018-06-10 DIAGNOSIS — R079 Chest pain, unspecified: Secondary | ICD-10-CM | POA: Diagnosis not present

## 2018-06-10 DIAGNOSIS — F17220 Nicotine dependence, chewing tobacco, uncomplicated: Secondary | ICD-10-CM | POA: Diagnosis not present

## 2018-06-10 DIAGNOSIS — Z79899 Other long term (current) drug therapy: Secondary | ICD-10-CM | POA: Insufficient documentation

## 2018-06-10 DIAGNOSIS — R531 Weakness: Secondary | ICD-10-CM | POA: Insufficient documentation

## 2018-06-10 DIAGNOSIS — J449 Chronic obstructive pulmonary disease, unspecified: Secondary | ICD-10-CM | POA: Insufficient documentation

## 2018-06-10 LAB — COMPREHENSIVE METABOLIC PANEL WITH GFR
ALT: 24 U/L (ref 0–44)
AST: 30 U/L (ref 15–41)
Albumin: 3.9 g/dL (ref 3.5–5.0)
Alkaline Phosphatase: 45 U/L (ref 38–126)
Anion gap: 7 (ref 5–15)
BUN: 19 mg/dL (ref 8–23)
CO2: 25 mmol/L (ref 22–32)
Calcium: 9.4 mg/dL (ref 8.9–10.3)
Chloride: 110 mmol/L (ref 98–111)
Creatinine, Ser: 0.86 mg/dL (ref 0.61–1.24)
GFR calc Af Amer: >60 mL/min
GFR calc non Af Amer: >60 mL/min
Glucose, Bld: 121 mg/dL — ABNORMAL HIGH (ref 70–99)
Potassium: 4.2 mmol/L (ref 3.5–5.1)
Sodium: 142 mmol/L (ref 135–145)
Total Bilirubin: 0.7 mg/dL (ref 0.3–1.2)
Total Protein: 7 g/dL (ref 6.5–8.1)

## 2018-06-10 LAB — URINALYSIS, COMPLETE (UACMP) WITH MICROSCOPIC
BACTERIA UA: NONE SEEN
Bilirubin Urine: NEGATIVE
Glucose, UA: NEGATIVE mg/dL
Hgb urine dipstick: NEGATIVE
Ketones, ur: NEGATIVE mg/dL
Leukocytes, UA: NEGATIVE
NITRITE: NEGATIVE
PROTEIN: 100 mg/dL — AB
Specific Gravity, Urine: 1.031 — ABNORMAL HIGH (ref 1.005–1.030)
pH: 6 (ref 5.0–8.0)

## 2018-06-10 LAB — CBC WITH DIFFERENTIAL/PLATELET
Abs Immature Granulocytes: 0.01 10*3/uL (ref 0.00–0.07)
BASOS PCT: 1 %
Basophils Absolute: 0 10*3/uL (ref 0.0–0.1)
Eosinophils Absolute: 0.3 10*3/uL (ref 0.0–0.5)
Eosinophils Relative: 6 %
HCT: 47.1 % (ref 39.0–52.0)
Hemoglobin: 15.1 g/dL (ref 13.0–17.0)
Immature Granulocytes: 0 %
Lymphocytes Relative: 18 %
Lymphs Abs: 0.9 10*3/uL (ref 0.7–4.0)
MCH: 29.3 pg (ref 26.0–34.0)
MCHC: 32.1 g/dL (ref 30.0–36.0)
MCV: 91.5 fL (ref 80.0–100.0)
MONO ABS: 0.7 10*3/uL (ref 0.1–1.0)
Monocytes Relative: 13 %
Neutro Abs: 3.3 10*3/uL (ref 1.7–7.7)
Neutrophils Relative %: 62 %
Platelets: 203 10*3/uL (ref 150–400)
RBC: 5.15 MIL/uL (ref 4.22–5.81)
RDW: 14.3 % (ref 11.5–15.5)
WBC: 5.2 10*3/uL (ref 4.0–10.5)
nRBC: 0 % (ref 0.0–0.2)

## 2018-06-10 LAB — TSH: TSH: 2.237 u[IU]/mL (ref 0.350–4.500)

## 2018-06-10 LAB — INFLUENZA PANEL BY PCR (TYPE A & B)
INFLBPCR: NEGATIVE
Influenza A By PCR: NEGATIVE

## 2018-06-10 LAB — TROPONIN I: Troponin I: <0.03 ng/mL (ref <0.03)

## 2018-06-10 MED ORDER — PREDNISOLONE ACETATE 1 % OP SUSP
2.0000 [drp] | Freq: Four times a day (QID) | OPHTHALMIC | Status: DC
  Administered 2018-06-10 – 2018-06-11 (×2): 2 [drp] via OPHTHALMIC
  Filled 2018-06-10 (×2): qty 5

## 2018-06-10 MED ORDER — SODIUM CHLORIDE 0.9 % IV BOLUS
500.0000 mL | Freq: Once | INTRAVENOUS | Status: AC
  Administered 2018-06-10: 500 mL via INTRAVENOUS

## 2018-06-10 NOTE — ED Triage Notes (Signed)
Patient from home via [facility]. Reports some visual changes since yesterday but unable to state exactly what was wrong. Patient also complaining of generalized weakness and "pain in his kidneys". Patient recently seen for similar symptoms. Patient lives alone with a home health worker that comes to check on him. Per home health, unsure of patient's compliance to daily medications.

## 2018-06-10 NOTE — ED Notes (Signed)
Aaron Gibbs [phone] daughter lives here in [place]. PLEASE call her when social worker is available. Claudine is Education officer, museum who they have been working with and daughter left a message for her

## 2018-06-10 NOTE — ED Provider Notes (Signed)
[facility] Emergency Department Provider Note ____________________________________________   First MD Initiated Contact with Patient 06/10/18 1516     (approximate)  I have reviewed the triage vital signs and the nursing notes.   HISTORY  Chief Complaint Weakness    HPI Aaron Gibbs is a 83 y.o. male with PMH as noted below who presents with multiple complaints, but primarily generalized weakness.  He reports that he has been persistent over the last 2 weeks since he left the hospital and is worsening.  In addition he reports diaphoresis, pain in his kidneys, shortness of breath, intermittent chest pain, as well as irritation and discharge from the left eye which he has had previously.  Past Medical History:  Diagnosis Date  . Anxiety   . Cancer ([place])   . COPD (chronic obstructive pulmonary disease) ([place])   . Glaucoma   . Osteoporosis    osteopenia    Patient Active Problem List   Diagnosis Date Noted  . Syncope and collapse 06/04/2018  . 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 Depression, major, recurrent, moderate ([place]) 06/04/2018  . Severe major depression, single episode, without psychotic features ([place]) 05/10/2018  . Blindness 05/10/2018  . Suicidal ideation 05/10/2018  . Chronic pain syndrome 05/01/2018  . DISH (diffuse idiopathic skeletal hyperostosis) 05/01/2018  . Osteoarthritis of multiple joints 05/01/2018  . Bilateral chronic knee pain 05/01/2018  . Retinitis pigmentosa of both eyes 05/01/2018  . Glaucoma of both eyes 05/01/2018  . Essential hypertension 05/01/2018  . Chronic low back pain 05/01/2018  . GERD (gastroesophageal reflux disease) 05/01/2018  . 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 Therapeutic opioid-induced constipation (OIC) 05/01/2018  . Centrilobular emphysema ([place]) 05/01/2018    Past Surgical History:  Procedure Laterality Date  . CHOLECYSTECTOMY      Prior to Admission medications     Medication Sig Start Date End Date Taking? Authorizing Provider  amLODipine (NORVASC) 10 MG tablet Take 10 mg by mouth daily.    [provider]  aspirin EC 81 MG tablet Take 81 mg by mouth daily.    [provider]  doxycycline (VIBRAMYCIN) 100 MG capsule Take 100 mg by mouth 2 (two) times daily. For 10 days, every month, chronic pneumonia prophylaxis    [provider]  fenofibrate (TRICOR) 145 MG tablet Take 145 mg by mouth daily.    [provider]  ferrous sulfate 325 (65 FE) MG tablet Take 325 mg by mouth daily with breakfast.    [provider]  meloxicam (MOBIC) 7.5 MG tablet Take 7.5 mg by mouth 2 (two) times daily.    [provider]  morphine (MS CONTIN) 15 MG 12 hr tablet Take 1 tablet (15 mg total) by mouth every 12 (twelve) hours. 05/01/18   Karamalegos, Devonne Doughty, DO  Multiple Vitamins-Minerals (MULTIVITAL PO) Take by mouth.    [provider]  nystatin (MYCOSTATIN/NYSTOP) powder Apply topically 4 (four) times daily. 05/29/18   Delman Kitten, MD  oxyCODONE-acetaminophen (PERCOCET) 10-325 MG tablet Take 1 tablet by mouth 3 (three) times daily.    [provider]  pantoprazole (PROTONIX) 40 MG tablet Take 40 mg by mouth 2 (two) times daily.    [provider]  polyethylene glycol powder (GLYCOLAX/MIRALAX) powder Take 17 g by mouth daily as needed for moderate constipation. 05/09/18   Harvest Dark, MD  pregabalin (LYRICA) 100 MG capsule Take 100 mg by mouth 4 (four) times daily.    [provider]  senna (  SENOKOT) 8.6 MG tablet Take 1 tablet by mouth daily.    [provider]  timolol (BETIMOL) 0.25 % ophthalmic solution 1-2 drops 2 (two) times daily.    [provider]  umeclidinium-vilanterol (ANORO ELLIPTA) 62.5-25 MCG/INH AEPB Inhale 1 puff into the lungs daily.    [provider]  venlafaxine (EFFEXOR) 75 MG tablet Take 75 mg by mouth 2 (two) times daily.     [provider]    Allergies Azithromycin  Family History  Problem Relation Age of Onset  . Depression Mother   . Heart disease Father     Social History Social History   Tobacco Use  . Smoking status: Former Smoker    Years: 15.00  . Smokeless tobacco: Current User    Types: Chew  Substance Use Topics  . Alcohol use: Not Currently    Comment: past  . Drug use: Never    Review of Systems  Constitutional: No fever.  Positive for weakness. Eyes: Left eye irritation and discharge.  No blurred vision. ENT: No sore throat. Cardiovascular: Positive for intermittent chest pain. Respiratory: Positive for shortness of breath. Gastrointestinal: No vomiting Genitourinary: Negative for dysuria.  Musculoskeletal: Negative for back pain. Skin: Negative for rash. Neurological: Negative for headache.   ____________________________________________   PHYSICAL EXAM:  VITAL SIGNS: ED Triage Vitals [06/10/18 1419]  Enc Vitals Group     BP (!) 172/84     Pulse Rate 75     Resp 18     Temp 98.1 F (36.7 C)     Temp Source Oral     SpO2 95 %     Weight 225 lb 15.5 oz (102.5 kg)     Height 5\' 10"  (1.778 m)     Head Circumference      Peak Flow      Pain Score 8     Pain Loc      Pain Edu?      Excl. in [place]?     Constitutional: Alert and oriented.  Relatively comfortable appearing and in no acute distress. Eyes: Conjunctivae are normal.  EOMI.  PERRLA.  Mild conjunctival ingestion and some clear discharge from the left eye. Head: Atraumatic. Nose: No congestion/rhinnorhea. Mouth/Throat: Mucous membranes are somewhat dry.   Neck: Normal range of motion.  Cardiovascular: Normal rate, regular rhythm. Grossly normal heart sounds.  Good peripheral circulation. Respiratory: Normal respiratory effort.  No retractions. Lungs CTAB. Gastrointestinal: Soft and nontender. No distention.  Genitourinary: No flank tenderness. Musculoskeletal: No lower extremity edema.   Extremities warm and well perfused.  Neurologic:  Normal speech and language.  5/5 motor strength and intact sensation to all extremities.  Normal coordination.  No gross focal neurologic deficits are appreciated.  Skin:  Skin is warm and dry. No rash noted. Psychiatric: Speech and behavior are normal.  ____________________________________________   LABS (all labs ordered are listed, but only abnormal results are displayed)  Labs Reviewed  COMPREHENSIVE METABOLIC PANEL - Abnormal; Notable for the following components:      Result Value   Glucose, Bld 121 (*)    All other components within normal limits  CBC WITH DIFFERENTIAL/PLATELET  TROPONIN I  TSH  INFLUENZA PANEL BY PCR (TYPE A & B)  URINALYSIS, COMPLETE (UACMP) WITH MICROSCOPIC   ____________________________________________  EKG  ED ECG REPORT I, Arta Silence, the attending physician, personally viewed and interpreted this ECG.  Date: 06/10/2018 EKG Time: 1535 Rate: 68 Rhythm: normal sinus rhythm QRS Axis: normal Intervals: normal  ST/T Wave abnormalities: normal Narrative Interpretation: no evidence of acute ischemia  ____________________________________________  RADIOLOGY  CXR: No focal infiltrate CT head: No ICH or other acute abnormality ____________________________________________   PROCEDURES  Procedure(s) performed: No  Procedures  Critical Care performed: No ____________________________________________   INITIAL IMPRESSION / ASSESSMENT AND PLAN / ED COURSE  Pertinent labs & imaging results that were available during my care of the patient were reviewed by me and considered in my medical decision making (see chart for details).  83 year old male with PMH as noted above presents with increased generalized weakness as well as multiple other symptoms over the last few weeks including intermittent chest pain, shortness of breath, diaphoresis, and flank pain.  I reviewed the past medical  records in [place].  The patient was most recently seen in the ED and admitted earlier this month with multiple syncopal episodes and weakness.  He was discharged the next day.  He declined any placement or change in his home care at that time, per care management notes.  The patient tells me that he has been feeling gradually sicker since then.  He reports that he currently does not have any home health assistance but his family member visits him.  On exam the patient is not acutely ill-appearing.  His vital signs are normal except for hypertension.  His neuro exam is nonfocal.  The remainder of the exam is unremarkable except for some conjunctival injection and discharge from the left eye consistent with a conjunctivitis.  The patient has had multiple prior ED visits over the last few months for similar generalized symptoms.  I suspect that overall he is having a decline in his baseline level of health and there is some question of prior notes about medication compliance.  I have a lower suspicion for a acute cause of his symptoms.  We will obtain CT head, chest x-ray, lab work-up, UA, and reassess.  I will treat him for conjunctivitis.  If there are no acute findings, I will discuss the with him what he thinks the appropriate next steps are in terms of whether he needs social work evaluation or placement, or is safe to go home.  ----------------------------------------- 6:43 PM on 06/10/2018 -----------------------------------------  The work-up so far is unremarkable.  We are still awaiting a UA.  There is no evidence of acute medical etiology of the patient's symptoms that would require admission.  I discussed the patient's situation with him further.  He feels that he is not safe to go home.  I attempted to ambulate him to the bathroom he felt weak and was unable to do so.  He states that he would like to be placed in a nursing facility or rehab.  There is no indication for inpatient admission at  this time.  For now, there he will board in the ED and be evaluated by PT and social work. ____________________________________________   FINAL CLINICAL IMPRESSION(S) / ED DIAGNOSES  Final diagnoses:  Weakness      NEW MEDICATIONS STARTED DURING THIS VISIT:  New Prescriptions   No medications on file     Note:  This document was prepared using Dragon voice recognition software and may include unintentional dictation errors.    Arta Silence, MD 06/10/18 [phone]

## 2018-06-10 NOTE — ED Notes (Signed)
Pt assisted to use urinal but was unable to urinate. Laura,RN aware.

## 2018-06-10 NOTE — ED Notes (Signed)
This RN assisted patient to ambulate to toilet to try to obtain urine sample. Patient unable to void at this time. Instructed to let staff know when he thought he could provide a sample.

## 2018-06-10 NOTE — ED Notes (Signed)
Pt resting in hall. Warm blanket given.

## 2018-06-10 NOTE — ED Notes (Signed)
Patient sitting at end of bed. Requesting something to eat. This RN gave patient sandwich tray and drink.

## 2018-06-11 MED ORDER — AMLODIPINE BESYLATE 5 MG PO TABS
10.0000 mg | ORAL_TABLET | Freq: Every day | ORAL | Status: DC
  Administered 2018-06-11: 10 mg via ORAL
  Filled 2018-06-11: qty 2

## 2018-06-11 MED ORDER — TIMOLOL HEMIHYDRATE 0.25 % OP SOLN
1.0000 [drp] | Freq: Two times a day (BID) | OPHTHALMIC | Status: DC
  Administered 2018-06-11: 1 [drp] via OPHTHALMIC
  Administered 2018-06-11: 2 [drp] via OPHTHALMIC

## 2018-06-11 MED ORDER — ASPIRIN EC 81 MG PO TBEC
81.0000 mg | DELAYED_RELEASE_TABLET | Freq: Every day | ORAL | Status: DC
  Administered 2018-06-11: 81 mg via ORAL
  Filled 2018-06-11: qty 1

## 2018-06-11 MED ORDER — SODIUM CHLORIDE 0.9 % IV BOLUS
1000.0000 mL | Freq: Once | INTRAVENOUS | Status: AC
  Administered 2018-06-11: 1000 mL via INTRAVENOUS

## 2018-06-11 MED ORDER — PREGABALIN 50 MG PO CAPS
100.0000 mg | ORAL_CAPSULE | Freq: Four times a day (QID) | ORAL | Status: DC
  Administered 2018-06-11: 100 mg via ORAL
  Filled 2018-06-11: qty 2

## 2018-06-11 MED ORDER — MELOXICAM 7.5 MG PO TABS
7.5000 mg | ORAL_TABLET | Freq: Two times a day (BID) | ORAL | Status: DC
  Administered 2018-06-11: 7.5 mg via ORAL
  Filled 2018-06-11 (×3): qty 1

## 2018-06-11 MED ORDER — VENLAFAXINE HCL 37.5 MG PO TABS
75.0000 mg | ORAL_TABLET | Freq: Two times a day (BID) | ORAL | Status: DC
  Administered 2018-06-11: 75 mg via ORAL
  Filled 2018-06-11 (×3): qty 2

## 2018-06-11 MED ORDER — PANTOPRAZOLE SODIUM 40 MG PO TBEC
40.0000 mg | DELAYED_RELEASE_TABLET | Freq: Two times a day (BID) | ORAL | Status: DC
  Administered 2018-06-11 (×2): 40 mg via ORAL
  Filled 2018-06-11 (×2): qty 1

## 2018-06-11 MED ORDER — MORPHINE SULFATE ER 15 MG PO TBCR
15.0000 mg | EXTENDED_RELEASE_TABLET | Freq: Two times a day (BID) | ORAL | Status: DC
  Administered 2018-06-11 (×2): 15 mg via ORAL
  Filled 2018-06-11 (×2): qty 1

## 2018-06-11 NOTE — Progress Notes (Signed)
LCSW called and left message for patients daughter. She will likely pick up patient by 2pm if she has not come please call her at [place] LCSW [phone]

## 2018-06-11 NOTE — ED Notes (Signed)
Pt does not want any lunch at this time, will offer again

## 2018-06-11 NOTE — ED Notes (Signed)
2 PO meds not given since due at 1000 and pt is asleep

## 2018-06-11 NOTE — Progress Notes (Signed)
LCSW reviewing file and will contact patients daughter.   [employer] LCSW [phone]

## 2018-06-11 NOTE — ED Provider Notes (Signed)
_________________________ 11:42 AM on 06/11/2018 -----------------------------------------  Cleared by PT and SW for discharge home with home health services. Patient remains well appearing, ambulating with no assistance and no difficulty, tolerating PO and his meds. Daughter will come to pick up patient around lunch time.   Rudene Re, MD 06/11/18 [phone]

## 2018-06-11 NOTE — ED Notes (Signed)
PT at bedside.

## 2018-06-11 NOTE — ED Provider Notes (Signed)
-----------------------------------------   4:36 PM on 06/11/2018 ----------------------------------------- Patient recently discharged from the emergency room.  On reviewing the physical therapy assessment, it is clear that the patient needs home health services to help restore his previous level of function so that he can better perform his ADLs.  He would benefit from home health physical therapy, Occupational Therapy, nursing, and home health aide.    Carrie Mew, MD 06/11/18 [phone]

## 2018-06-11 NOTE — Progress Notes (Signed)
LCSW consulted with EDP we are awaiting PT consult and care management. As per Dr the patient is medically cleared. She was advised that I have left message with daughter and am awaiting call back. Patient is resting and asleep.Patient does not meet criteria for in patient stay. As he is ambulatory- I dont think he meets criteria for STR-as well. Will await team members assessments.   LCSW will proceed once I hear from his daughter. He could return home as he has home health and his daughter/family to visit daily this plan will need to be worked on further.  [employer] LCSW [phone]

## 2018-06-11 NOTE — ED Notes (Signed)
Case management at bedside.

## 2018-06-11 NOTE — ED Notes (Signed)
Case management at bedside with family reviewing d/c instructions and follow up plans.

## 2018-06-11 NOTE — Evaluation (Signed)
Physical Therapy Evaluation Patient Details Name: Aaron Gibbs MRN: [id number] DOB: [birth year] Today's Date: 06/11/2018   History of Present Illness  presented to ER secondary to generalized weakness, intermittent diaphoresis, SOB  Clinical Impression  Upon evaluation, patient alert and oriented; follows commands and demonstrates good effort with all mobility tasks.  Bilat UE/LE strength and ROM grossly symmetrical, WFL for basic transfers and gait.  Reports mild pain (baseline) in L knee due to chronic arthritic changes; unchanged with this admission.  Demonstrates ability to complete bed mobility with mod indep; sit/stand, basic transfers and gait (100') with RW, cga/close sup.  Slow and guarded in cadence and overall gait performance; no overt buckling or LOB noted.  Does require min assist for walker management/pathway negotiation due to baseline visual deficits; anticipate improved performance and confidence with mobility in familiar environment. Of note, Orthostatic assessment-see vitals flowsheet for details.  does appear to have significant drop; slightly muted by elevated pressures overall (but does have 40-50 point drop with transition to upright).  MD informed/aware. Would benefit from skilled PT to address above deficits and promote optimal return to PLOF; Recommend transition to [place], [place], [place], [place] upon discharge from acute hospitalization.  Additionally, may benefit from information on resources for visually impaired.     Follow Up Recommendations Home health PT(HHOT, HHRN, HHaide; resources for visually impaired?)    Equipment Recommendations       Recommendations for Other Services       Precautions / Restrictions Precautions Precautions: Fall Restrictions Weight Bearing Restrictions: No      Mobility  Bed Mobility Overal bed mobility: Modified Independent                Transfers Overall transfer level: Needs assistance Equipment used: Rolling  walker (2 wheeled) Transfers: Sit to/from Stand Sit to Stand: Min guard         General transfer comment: cuing for hand placement  Ambulation/Gait Ambulation/Gait assistance: Min guard Gait Distance (Feet): 100 Feet Assistive device: Rolling walker (2 wheeled)       General Gait Details: forward flexed posture with reciprocal stepping pattern; slow, guarded cadence and overall gait performance without buckling or LOB.  Min assist for walker management/pathway negotiation (due to baseline visual deficits); anticipate improved performance in familiar environment  Stairs            Wheelchair Mobility    Modified Rankin (Stroke Patients Only)       Balance Overall balance assessment: Needs assistance Sitting-balance support: No upper extremity supported;Feet supported Sitting balance-Leahy Scale: Good     Standing balance support: Bilateral upper extremity supported Standing balance-Leahy Scale: Fair                               Pertinent Vitals/Pain Pain Assessment: Faces Faces Pain Scale: Hurts little more Pain Location: L knee Pain Descriptors / Indicators: Aching;Grimacing;Guarding Pain Intervention(s): Limited activity within patient's tolerance;Monitored during session;Repositioned;Patient requesting pain meds-RN notified    Home Living Family/patient expects to be discharged to:: Private residence Living Arrangements: Alone Available Help at Discharge: Family;Available PRN/intermittently Type of Home: House       Home Layout: One level Home Equipment: [place] - 2 wheels;Cane - single point      Prior Function Level of Independence: Independent with assistive device(s)         Comments: Mod indep with SPC for household mobility (intermittent use of RW at times); daughter provides intermittent assist  with meals, errands.  Did have aide involved, but has recently terminated services per patient report     Hand Dominance         Extremity/Trunk Assessment   Upper Extremity Assessment Upper Extremity Assessment: Overall WFL for tasks assessed    Lower Extremity Assessment Lower Extremity Assessment: Overall WFL for tasks assessed(grossly at least 4-/5 throughout)       Communication   Communication: No difficulties  Cognition Arousal/Alertness: Awake/alert Behavior During Therapy: WFL for tasks assessed/performed Overall Cognitive Status: Within Functional Limits for tasks assessed                                        General Comments      Exercises Other Exercises Other Exercises: Sit/stand without assist device, cga/min assist-tends to reach for furniture for stabilization Other Exercises: Short-distance gait (20') without assist device, min assist-unsafe/unable to complete without at least unilateral UE stabilization Other Exercises: Orthostatic assessment-see vitals flowsheet for details.  does appear to have significant drop; slightly muted by elevated pressures overall (but does have 40-50 point drop with transition to upright).  MD informed/aware.   Assessment/Plan    PT Assessment Patient needs continued PT services  PT Problem List Decreased activity tolerance;Decreased balance;Decreased mobility;Decreased knowledge of use of DME;Decreased safety awareness;Decreased knowledge of precautions       PT Treatment Interventions DME instruction;Gait training;Functional mobility training;Therapeutic activities;Therapeutic exercise;Balance training;Patient/family education    PT Goals (Current goals can be found in the Care Plan section)  Acute Rehab PT Goals Patient Stated Goal: to get ahead of the game so I can get better and get back to myself PT Goal Formulation: With patient Time For Goal Achievement: 06/25/18 Potential to Achieve Goals: Good    Frequency Min 2X/week   Barriers to discharge        Co-evaluation               AM-PAC PT "6 Clicks" Mobility   Outcome Measure Help needed turning from your back to your side while in a flat bed without using bedrails?: None Help needed moving from lying on your back to sitting on the side of a flat bed without using bedrails?: None Help needed moving to and from a bed to a chair (including a wheelchair)?: A Little Help needed standing up from a chair using your arms (e.g., wheelchair or bedside chair)?: A Little Help needed to walk in hospital room?: A Little Help needed climbing 3-5 steps with a railing? : A Little 6 Click Score: 20    End of Session Equipment Utilized During Treatment: Gait belt Activity Tolerance: Patient tolerated treatment well Patient left: in bed;with call bell/phone within reach Nurse Communication: Mobility status PT Visit Diagnosis: Muscle weakness (generalized) (M62.81);Difficulty in walking, not elsewhere classified (R26.2)    Time: 3559-7416 PT Time Calculation (min) (ACUTE ONLY): 31 min   Charges:   PT Evaluation $PT Eval Moderate Complexity: 1 Mod PT Treatments $Therapeutic Exercise: 23-37 mins          Moni Rothrock H. Owens Shark, PT, DPT, NCS 06/11/18, 10:43 AM [phone]

## 2018-06-11 NOTE — ED Notes (Signed)
Pt up at various times during the night walking about room and fixing his bed. Always asks what time it is and about his eye drops.

## 2018-06-11 NOTE — Care Management Note (Signed)
Case Management Note  Patient Details  Name: Aaron Gibbs MRN: [id number] Date of Birth: Jul 10, 1934  Subjective/Objective:     Patient is from home in [place]; daughters live just down the road.  In the ED with weakness.  Discharging to home today.  Daughter to pick up around 3pm.  List of home health agencies provided and left at bedside.  Referral made to [place] for RN, PT, SW and OT.  Has 4 hours of private services currently.  Was recently discharged to home with private services; declined home health at that time.                 Action/Plan:   Expected Discharge Date:                  Expected Discharge Plan:  [place]  In-House Referral:     Discharge planning Services  CM Consult  Post Acute Care Choice:    Choice offered to:     DME Arranged:    DME Agency:     HH Arranged:  RN, PT, OT, Social Work, Nurse's Aide [place] Agency:  [place]  Status of Service:  Completed, signed off  If discussed at [employer], dates discussed:    Additional Comments:  Elza Rafter, RN 06/11/2018, 1:39 PM

## 2018-06-11 NOTE — Progress Notes (Signed)
LCSW called and left a detailed [place] friendly message awaiting a call back   [place] LCSW [phone]

## 2018-06-11 NOTE — Progress Notes (Signed)
LCSW received call from his daughter. It was explained to her that her dad does not meet in patient criteria- She understands as agreeable to pick up the patient. LCSW was awaiting care management consult and he will then be able to return home.  Call daughter to pick him up.  Consulted with PT and he does not meet criteria for STR at SNF but would benefit from some additional in home supports. LCSW asked PT to consult with Care Mgmt today to have those arranged for patient.  [employer] LCSW [phone]

## 2018-06-11 NOTE — ED Notes (Signed)
Daughter at bedside to pickup pt

## 2018-06-11 NOTE — ED Notes (Signed)
Breakfast tray given at this time.  

## 2018-06-12 ENCOUNTER — Emergency Department (HOSPITAL_COMMUNITY)
Admission: EM | Admit: 2018-06-12 | Discharge: 2018-06-16 | Disposition: A | Discharge status: Other Acute Inpatient Hospital | Payer: Medicare Other | Attending: Emergency Medicine | Admitting: Emergency Medicine

## 2018-06-12 ENCOUNTER — Emergency Department (HOSPITAL_COMMUNITY): Payer: Medicare Other

## 2018-06-12 DIAGNOSIS — Y929 Unspecified place or not applicable: Secondary | ICD-10-CM | POA: Insufficient documentation

## 2018-06-12 DIAGNOSIS — T1491XA Suicide attempt, initial encounter: Secondary | ICD-10-CM

## 2018-06-12 DIAGNOSIS — Y999 Unspecified external cause status: Secondary | ICD-10-CM | POA: Diagnosis not present

## 2018-06-12 DIAGNOSIS — R45851 Suicidal ideations: Secondary | ICD-10-CM | POA: Diagnosis not present

## 2018-06-12 DIAGNOSIS — S21332A Puncture wound without foreign body of left front wall of thorax with penetration into thoracic cavity, initial encounter: Secondary | ICD-10-CM | POA: Insufficient documentation

## 2018-06-12 DIAGNOSIS — S299XXA Unspecified injury of thorax, initial encounter: Secondary | ICD-10-CM | POA: Diagnosis present

## 2018-06-12 DIAGNOSIS — Y939 Activity, unspecified: Secondary | ICD-10-CM | POA: Insufficient documentation

## 2018-06-12 DIAGNOSIS — T148XXA Other injury of unspecified body region, initial encounter: Secondary | ICD-10-CM

## 2018-06-12 DIAGNOSIS — F332 Major depressive disorder, recurrent severe without psychotic features: Secondary | ICD-10-CM | POA: Diagnosis not present

## 2018-06-12 DIAGNOSIS — X781XXA Intentional self-harm by knife, initial encounter: Secondary | ICD-10-CM | POA: Insufficient documentation

## 2018-06-12 LAB — PREPARE FRESH FROZEN PLASMA
Unit division: 0
Unit division: 0

## 2018-06-12 LAB — CBC WITH DIFFERENTIAL/PLATELET
Abs Immature Granulocytes: 0.03 10*3/uL (ref 0.00–0.07)
Basophils Absolute: 0.1 10*3/uL (ref 0.0–0.1)
Basophils Relative: 1 %
Eosinophils Absolute: 0.3 10*3/uL (ref 0.0–0.5)
Eosinophils Relative: 5 %
HEMATOCRIT: 44.4 % (ref 39.0–52.0)
Hemoglobin: 14.4 g/dL (ref 13.0–17.0)
Immature Granulocytes: 1 %
Lymphocytes Relative: 18 %
Lymphs Abs: 0.9 10*3/uL (ref 0.7–4.0)
MCH: 29.6 pg (ref 26.0–34.0)
MCHC: 32.4 g/dL (ref 30.0–36.0)
MCV: 91.2 fL (ref 80.0–100.0)
MONOS PCT: 11 %
Monocytes Absolute: 0.6 10*3/uL (ref 0.1–1.0)
Neutro Abs: 3.4 10*3/uL (ref 1.7–7.7)
Neutrophils Relative %: 64 %
Platelets: 199 10*3/uL (ref 150–400)
RBC: 4.87 MIL/uL (ref 4.22–5.81)
RDW: 14.1 % (ref 11.5–15.5)
WBC: 5.2 10*3/uL (ref 4.0–10.5)
nRBC: 0 % (ref 0.0–0.2)

## 2018-06-12 LAB — BPAM FFP
Blood Product Expiration Date: 201912252359
Blood Product Expiration Date: 201912252359
ISSUE DATE / TIME: 201912251736
ISSUE DATE / TIME: 201912251736
Unit Type and Rh: 6200
Unit Type and Rh: 6200

## 2018-06-12 LAB — BPAM RBC
BLOOD PRODUCT EXPIRATION DATE: 201912302359
Blood Product Expiration Date: 201912302359
ISSUE DATE / TIME: 201912251736
ISSUE DATE / TIME: 201912251736
Unit Type and Rh: 9500
Unit Type and Rh: 9500

## 2018-06-12 LAB — COMPREHENSIVE METABOLIC PANEL
ALT: 25 U/L (ref 0–44)
AST: 31 U/L (ref 15–41)
Albumin: 3.4 g/dL — ABNORMAL LOW (ref 3.5–5.0)
Alkaline Phosphatase: 39 U/L (ref 38–126)
Anion gap: 9 (ref 5–15)
BUN: 25 mg/dL — ABNORMAL HIGH (ref 8–23)
CHLORIDE: 110 mmol/L (ref 98–111)
CO2: 22 mmol/L (ref 22–32)
Calcium: 9.4 mg/dL (ref 8.9–10.3)
Creatinine, Ser: 0.87 mg/dL (ref 0.61–1.24)
GFR calc Af Amer: >60 mL/min (ref >60)
GFR calc non Af Amer: >60 mL/min (ref >60)
GLUCOSE: 110 mg/dL — AB (ref 70–99)
Potassium: 4.3 mmol/L (ref 3.5–5.1)
Sodium: 141 mmol/L (ref 135–145)
Total Bilirubin: 0.9 mg/dL (ref 0.3–1.2)
Total Protein: 5.9 g/dL — ABNORMAL LOW (ref 6.5–8.1)

## 2018-06-12 LAB — RAPID URINE DRUG SCREEN, HOSP PERFORMED
AMPHETAMINES: NOT DETECTED
Barbiturates: NOT DETECTED
Benzodiazepines: NOT DETECTED
Cocaine: NOT DETECTED
Opiates: POSITIVE — AB
Tetrahydrocannabinol: NOT DETECTED

## 2018-06-12 LAB — SALICYLATE LEVEL: Salicylate Lvl: <7 mg/dL (ref 2.8–30.0)

## 2018-06-12 LAB — ETHANOL: Alcohol, Ethyl (B): <10 mg/dL (ref <10)

## 2018-06-12 LAB — ACETAMINOPHEN LEVEL: Acetaminophen (Tylenol), Serum: <10 ug/mL — ABNORMAL LOW (ref 10–30)

## 2018-06-12 MED ORDER — MELOXICAM 7.5 MG PO TABS
7.5000 mg | ORAL_TABLET | Freq: Two times a day (BID) | ORAL | Status: DC
  Administered 2018-06-12 – 2018-06-15 (×7): 7.5 mg via ORAL
  Filled 2018-06-12 (×8): qty 1

## 2018-06-12 MED ORDER — AMLODIPINE BESYLATE 5 MG PO TABS
10.0000 mg | ORAL_TABLET | Freq: Every day | ORAL | Status: DC
  Administered 2018-06-12 – 2018-06-16 (×5): 10 mg via ORAL
  Filled 2018-06-12 (×5): qty 2

## 2018-06-12 MED ORDER — DOXYCYCLINE HYCLATE 100 MG PO TABS
100.0000 mg | ORAL_TABLET | Freq: Two times a day (BID) | ORAL | Status: DC
  Administered 2018-06-12 – 2018-06-16 (×8): 100 mg via ORAL
  Filled 2018-06-12 (×8): qty 1

## 2018-06-12 MED ORDER — LIDOCAINE HCL (PF) 1 % IJ SOLN
5.0000 mL | Freq: Once | INTRAMUSCULAR | Status: AC
  Administered 2018-06-12: 5 mL via INTRADERMAL

## 2018-06-12 MED ORDER — ASPIRIN EC 81 MG PO TBEC
81.0000 mg | DELAYED_RELEASE_TABLET | Freq: Every day | ORAL | Status: DC
  Administered 2018-06-12 – 2018-06-16 (×5): 81 mg via ORAL
  Filled 2018-06-12 (×5): qty 1

## 2018-06-12 MED ORDER — MORPHINE SULFATE ER 15 MG PO TBCR
15.0000 mg | EXTENDED_RELEASE_TABLET | Freq: Two times a day (BID) | ORAL | Status: DC
  Administered 2018-06-12 – 2018-06-16 (×8): 15 mg via ORAL
  Filled 2018-06-12 (×8): qty 1

## 2018-06-12 MED ORDER — UMECLIDINIUM-VILANTEROL 62.5-25 MCG/INH IN AEPB
1.0000 | INHALATION_SPRAY | Freq: Every day | RESPIRATORY_TRACT | Status: DC
  Administered 2018-06-13 – 2018-06-16 (×4): 1 via RESPIRATORY_TRACT
  Filled 2018-06-12 (×2): qty 14

## 2018-06-12 MED ORDER — LIDOCAINE HCL (PF) 1 % IJ SOLN
30.0000 mL | Freq: Once | INTRAMUSCULAR | Status: AC
  Administered 2018-06-12: 30 mL
  Filled 2018-06-12: qty 30

## 2018-06-12 MED ORDER — FERROUS SULFATE 325 (65 FE) MG PO TABS
325.0000 mg | ORAL_TABLET | Freq: Every day | ORAL | Status: DC
  Administered 2018-06-13 – 2018-06-16 (×4): 325 mg via ORAL
  Filled 2018-06-12 (×4): qty 1

## 2018-06-12 MED ORDER — IOHEXOL 300 MG/ML  SOLN
75.0000 mL | Freq: Once | INTRAMUSCULAR | Status: AC | PRN
  Administered 2018-06-12: 75 mL via INTRAVENOUS

## 2018-06-12 MED ORDER — PREGABALIN 50 MG PO CAPS
100.0000 mg | ORAL_CAPSULE | Freq: Four times a day (QID) | ORAL | Status: DC
  Administered 2018-06-12 – 2018-06-16 (×14): 100 mg via ORAL
  Filled 2018-06-12 (×11): qty 2
  Filled 2018-06-12: qty 4
  Filled 2018-06-12 (×2): qty 2

## 2018-06-12 MED ORDER — PREDNISOLONE ACETATE 1 % OP SUSP
1.0000 [drp] | Freq: Two times a day (BID) | OPHTHALMIC | Status: DC
  Administered 2018-06-12 – 2018-06-15 (×6): 1 [drp] via OPHTHALMIC
  Filled 2018-06-12: qty 5

## 2018-06-12 MED ORDER — VENLAFAXINE HCL 75 MG PO TABS
75.0000 mg | ORAL_TABLET | Freq: Two times a day (BID) | ORAL | Status: DC
  Administered 2018-06-12 – 2018-06-15 (×7): 75 mg via ORAL
  Filled 2018-06-12 (×8): qty 1

## 2018-06-12 MED ORDER — SENNA 8.6 MG PO TABS
3.0000 | ORAL_TABLET | ORAL | Status: DC
  Administered 2018-06-12 – 2018-06-16 (×3): 25.8 mg via ORAL
  Filled 2018-06-12 (×3): qty 3

## 2018-06-12 MED ORDER — PANTOPRAZOLE SODIUM 40 MG PO TBEC
40.0000 mg | DELAYED_RELEASE_TABLET | Freq: Two times a day (BID) | ORAL | Status: DC
  Administered 2018-06-12 – 2018-06-16 (×8): 40 mg via ORAL
  Filled 2018-06-12 (×8): qty 1

## 2018-06-12 NOTE — Progress Notes (Signed)
   06/12/18 1800  Clinical Encounter Type  Visited With Health care provider  Visit Type Initial;ED;Trauma  Referral From Nurse  [facility] reported for Level 1 Trauma; No family currently present; Daughter of patient on the way.

## 2018-06-12 NOTE — ED Notes (Signed)
Face to face TTS in process-Monique,RN
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
## 2018-06-12 NOTE — ED Notes (Signed)
MD at bedside for lac repair
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
## 2018-06-12 NOTE — ED Notes (Signed)
Pt was given [place] sandwich and drink for dinner.

## 2018-06-12 NOTE — ED Notes (Signed)
Aaron Gibbs, Pts daughter: [phone].
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
## 2018-06-13 LAB — BLOOD PRODUCT ORDER (VERBAL) VERIFICATION

## 2018-06-13 MED ORDER — OXYCODONE-ACETAMINOPHEN 5-325 MG PO TABS
2.0000 | ORAL_TABLET | Freq: Once | ORAL | Status: AC
  Administered 2018-06-13: 2 via ORAL
  Filled 2018-06-13: qty 2

## 2018-06-13 MED ORDER — TIMOLOL MALEATE 0.25 % OP SOLN
1.0000 [drp] | Freq: Two times a day (BID) | OPHTHALMIC | Status: DC
  Administered 2018-06-13 – 2018-06-14 (×4): 2 [drp] via OPHTHALMIC
  Administered 2018-06-15: 1 [drp] via OPHTHALMIC
  Administered 2018-06-15 – 2018-06-16 (×2): 2 [drp] via OPHTHALMIC
  Filled 2018-06-13 (×2): qty 5

## 2018-06-13 MED ORDER — TIMOLOL HEMIHYDRATE 0.25 % OP SOLN
1.0000 [drp] | Freq: Two times a day (BID) | OPHTHALMIC | Status: DC
  Filled 2018-06-13: qty 5

## 2018-06-13 NOTE — ED Notes (Signed)
Pt's daughter calling in

## 2018-06-13 NOTE — ED Notes (Signed)
Pt's daughter to visit and pick up valueables envelope with the pt's permission, copy of regular visiting hours given. Pt's daughter updated on plan of care with permission of pt

## 2018-06-13 NOTE — ED Notes (Signed)
Attempted to wake pt for meds, sitter attempted to wake pt for breakfast approx 20 minutes ago, pt expressed he did not want to eat at that time and went back to sleep
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
## 2018-06-13 NOTE — ED Notes (Signed)
Snack given to patient.  
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
## 2018-06-13 NOTE — Progress Notes (Signed)
Pt meets inpatient criteria per Lindon Romp, NP. Referral information has been sent out to the following hospitals for review: [facility]  [facility]   Disposition will continue to assist with inpatient placement needs.   Audree Camel, LCSW, [place] Disposition [place] [facility] BHH/TTS [phone]

## 2018-06-14 ENCOUNTER — Inpatient Hospital Stay: Payer: Self-pay | Admitting: Family Medicine

## 2018-06-14 MED ORDER — ONDANSETRON 4 MG PO TBDP
4.0000 mg | ORAL_TABLET | Freq: Once | ORAL | Status: AC
  Administered 2018-06-14: 4 mg via ORAL
  Filled 2018-06-14: qty 1

## 2018-06-14 MED ORDER — OXYCODONE-ACETAMINOPHEN 5-325 MG PO TABS
1.0000 | ORAL_TABLET | Freq: Three times a day (TID) | ORAL | Status: DC | PRN
  Administered 2018-06-14 – 2018-06-16 (×4): 1 via ORAL
  Filled 2018-06-14 (×4): qty 1

## 2018-06-14 MED ORDER — OXYCODONE-ACETAMINOPHEN 10-325 MG PO TABS: 1.0000 | ORAL_TABLET | Freq: Three times a day (TID) | ORAL | Status: DC

## 2018-06-14 MED ORDER — OXYCODONE HCL 5 MG PO TABS
5.0000 mg | ORAL_TABLET | Freq: Three times a day (TID) | ORAL | Status: DC | PRN
  Administered 2018-06-14 – 2018-06-16 (×3): 5 mg via ORAL
  Filled 2018-06-14 (×3): qty 1

## 2018-06-14 MED ORDER — OXYCODONE-ACETAMINOPHEN 5-325 MG PO TABS
2.0000 | ORAL_TABLET | Freq: Once | ORAL | Status: AC
  Administered 2018-06-14: 2 via ORAL
  Filled 2018-06-14: qty 2
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
## 2018-06-14 NOTE — ED Notes (Signed)
Regular Diet was ordered for Lunch. 

## 2018-06-14 NOTE — BH Assessment (Signed)
[place] Assessment Progress Note   TTS Reassessment:  Patient states that he is feeling some better today.  He denies current suicidal thoughts, but does admit that he continues to remain depressed.  Patient's ability to contract for safety is questionable.  Patient is somewhat irritable today and states that he is tired of being in the ED because "no one is doing anything for me."  Because of patient's inability to contract for safety, TTS and Social Work will continue to seek placement for him in a [facility] facility.

## 2018-06-15 ENCOUNTER — Other Ambulatory Visit: Payer: Self-pay

## 2018-06-15 MED ORDER — HYDROXYZINE HCL 25 MG PO TABS
25.0000 mg | ORAL_TABLET | Freq: Three times a day (TID) | ORAL | Status: DC | PRN
  Administered 2018-06-15: 25 mg via ORAL
  Filled 2018-06-15: qty 1

## 2018-06-15 NOTE — Progress Notes (Addendum)
Patient has been accepted at [facility]. Accepting provider Dr. Lucilla Edin, MD. Bed number 532. Number to call report is [phone]. Wells Guiles, RN made aware of disposition and updated that patient needed to be IVC'd.   Chalmers Guest. Guerry Bruin, MSW, [place] Work/Disposition Phone: [phone] Fax: [phone]

## 2018-06-15 NOTE — ED Notes (Signed)
Pt has returned to sleeping - spoke w/daughter on phone earlier.

## 2018-06-15 NOTE — ED Notes (Signed)
IVC papers served - copy placed in Medical Records, original placed in folder for Magistrate, and all 3 sets on clipboard.

## 2018-06-15 NOTE — ED Notes (Signed)
Breakfast ordered reg 

## 2018-06-15 NOTE — ED Notes (Signed)
Pt aware has been accepted to [facility] and will be transported in AM. Pt voiced understanding and agreement w/tx plan.

## 2018-06-15 NOTE — Progress Notes (Signed)
CSW spoke with [name] Filbert from [facility] regarding referral. He asked questions regarding restraints, IM medications, ADLs, ambulation, and patient's medication. CSW contacted Wells Guiles, RN regarding these questions and got pertinent information. CSW returned call to [name] Filbert and gave him information that patient had not been through any restraints in the past 24hrs (medication/physical), no issues with ADLs or ambulation, and patient chronic knee pain. [name] Filbert was informed that nurse would be willing to answer any further questions and was given number for contacting Wells Guiles, Therapist, sports. He stated that the referral would be processed with the doctor.  Chalmers Guest. Guerry Bruin, MSW, [place] Work/Disposition Phone: [phone] Fax: [phone]

## 2018-06-15 NOTE — BHH Counselor (Signed)
Re-assessment:   Patient report he's not doing good, stating, "I am tried of being in the hospital and ya'll are doing nothing." Patient denied suicidal thoughts, but reports he feels depressed. Patient stated he wants to home, he lives by himself. Report he has two daughters that live a half a block from him and they check on him daily.   Patient complained due to him being in the hospital he has missed his appointments with the pain clinic. Report he has bad knees and takes opiates (pain pills) and is out of medication. Patient wants to know what the hospital is going to do about his medication until he's rescheduled with the pain clinic.    Sheran Fava, NP, patient continues to meet inpatient criteria

## 2018-06-15 NOTE — BHH Counselor (Signed)
TTS attempted to assess patient, informed patient was asleep and unable to awake. Wells Guiles, RN, will notify TTS when patient is awake.

## 2018-06-15 NOTE — ED Notes (Signed)
IVC papers faxed to Magistrate - confirmed receipt. 

## 2018-06-15 NOTE — ED Notes (Signed)
Sutures to left chest/abd noted to be intact - dry, clean. No drainage noted.

## 2018-06-15 NOTE — ED Notes (Signed)
No Sitter available for pt per Staffing Office - Charge RN aware.

## 2018-06-15 NOTE — ED Notes (Signed)
Re-TTS being performed.  

## 2018-06-15 NOTE — Progress Notes (Signed)
CSW contacted facilities regarding referrals with the following results:  Still reviewing:  Geographical information systems officer (resent per request) [name] (resent per request)  Declined:  [facility] (due to medical acuity) [facility] (due to medical issues)  Chalmers Guest. Guerry Bruin, MSW, [place] Work/Disposition Phone: [phone] Fax: [phone]

## 2018-06-15 NOTE — ED Notes (Signed)
Pt ate lunch.
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
## 2018-06-16 NOTE — ED Notes (Signed)
Called up for Regular [facility] 
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
## 2018-06-16 NOTE — ED Notes (Signed)
Pt refused to be transported w/o being given narcotic meds - given.

## 2018-06-24 ENCOUNTER — Telehealth: Payer: Self-pay
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
## 2018-06-24 NOTE — Telephone Encounter (Signed)
[facility] called regarding patient recent visit at ED and daughter is requesting help from [facility] but they don't provide extended help with psychiatric and  resources available at [facility]  Which is far for the patient. Please suggest?

## 2018-06-25 NOTE — Telephone Encounter (Signed)
Verner Mould CSW from [facility] called me back today 06/25/18, around 0845 discussed this case, she states that the patient's daughter reached her and requested [place] orders from the physician there and these were written yesterday but she did not have contact to reach the patient's daughter back or know where to send the orders. They are for home health eval and treat.  I requested a discharge summary and she will check on this, but it may be in medical records only. She said appointment with psychiatry follow-up is on discharge paperwork with patient. Also I asked if there is any mental health appropriate home health care, and she stated not that she knew of, and usually patients would remain in facility or agree to go to ALF or other location, however this patient is his own guardian and has chosen to go back home.  Will receive faxed order [facility] and we can send this to whichever agency, otherwise we will now need to contact [facility] to discuss further what care they can and cannot provide or location other providers.  [name], DO [facility] 06/25/2018, 9:04 AM

## 2018-06-25 NOTE — Telephone Encounter (Signed)
Unfortunately [facility] can't help patient for any services since they don't have psych nurse to oversee for safety of their clinician but Lillia Pauls [phone] had reached out to social worker for other resources for patient and list has been emailed.

## 2018-06-25 NOTE — Telephone Encounter (Signed)
Received discharge summary from [facility]. Will review with patient tomorrow 06/26/18. Reviewed the list of available mental health providers for [facility] that may help this patient, printed copy for patient tomorrow, anticipate will discuss with daughter and she can help locate one of these providers to help him and we can refer as needed  Aaron Gibbs, [facility] 06/25/2018, 4:58 PM

## 2018-06-26 ENCOUNTER — Encounter: Payer: Self-pay | Admitting: Family Medicine

## 2018-06-26 ENCOUNTER — Ambulatory Visit (INDEPENDENT_AMBULATORY_CARE_PROVIDER_SITE_OTHER): Payer: Medicare Other | Admitting: Family Medicine

## 2018-06-26 VITALS — BP 145/77 | HR 91 | Temp 97.8°F | Resp 16 | Ht 70.0 in | Wt 227.0 lb

## 2018-06-26 DIAGNOSIS — T1491XA Suicide attempt, initial encounter: Secondary | ICD-10-CM | POA: Insufficient documentation

## 2018-06-26 DIAGNOSIS — F332 Major depressive disorder, recurrent severe without psychotic features: Secondary | ICD-10-CM | POA: Diagnosis not present

## 2018-06-26 DIAGNOSIS — G894 Chronic pain syndrome: Secondary | ICD-10-CM | POA: Diagnosis not present

## 2018-06-26 HISTORY — DX: Suicide attempt, initial encounter: T14.91XA
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
## 2018-06-26 NOTE — Patient Instructions (Addendum)
Thank you for coming to the office today.  Keep your appointment with Pain Management 07/10/18 as scheduled.   If thoughts of harming yourself or others, or any significant concern about your safety, please call for help immediately:  - [place] or [phone] - Suicide prevention hotline [phone]  - [place] / [facility]) [place] [address] [place], [place] [address] Phone: [phone]  [place], available walk-in 9am-4pm M-F [place], [place] [address] Hours: [place] (M-F, walk in available) Phone:([phone]) [place]   Address: [place], [place], [place] [address] Hours: 8AM-5PM (accepts walk in to establish) Phone: [phone]  Call to check availability if [facility] can see him [place] ([place] at [facility]) Address: [address], [place], [place] [address] Hours: 8:30AM-5PM Phone: [phone]  -------------------------------------------------------  [facility] Address: [address] [place], [place] [address] Phone: [phone]  ---------------------------------------------  Please call to re-schedule with Ophthalmology - tell them you were in the hospital  [facility] [place] [address] [place],  [address] Phone: [phone] [email or web address]   Please schedule a Follow-up Appointment to: Return in about 5 weeks (around 08/01/2018) for As scheduled.  If you have any other questions or concerns, please feel free to call the office or send a message through [place]. You may also schedule an earlier appointment if necessary.  Additionally, you may be receiving a survey about your experience at our office within a few days to 1 week by e-mail or mail. We value your  feedback.  [name], DO [place]

## 2018-06-27 ENCOUNTER — Encounter: Payer: Self-pay | Admitting: Family Medicine

## 2018-06-27 ENCOUNTER — Telehealth: Payer: Self-pay | Admitting: Family Medicine

## 2018-06-27 NOTE — Telephone Encounter (Signed)
See office visit from yesterday 06/26/18 for more information.  Briefly, attempted to call Rosaria Ferries, patient's daughter, to discuss his last office visit and help coordinate his outpatient follow-up because there were some questions about [place] or [place] and Psychiatry.   If [name] calls back - please share some of the following information, and perhaps I can talk to her again at some point that will be convenient.  1. Psychiatry - He will need to see a Psychiatrist outpatient. He was given referral information when he left [facility]. Please check his discharge paperwork to see if you have info on this. ALSO - I have written this down on his last After Visit Summary from me on 06/26/18 - the best first options for him to try are [place] and [place] below - these are [place] usually to start care, once established they will make arrangements for follow-up. This is very important for him as a next step to help with his mood.  [facility]) [place] [address] [place], [place] [address] Phone: [phone]  [employer], available walk-in 9am-4pm M-F [place], [place] [address] Hours: [place] (M-F, walk in available) [phone]  I do not think we have too many other options for local "Geriatric" Psychiatry based on his age and other medical problems. I checked and do not think [facility] will be a good fit for this patient.  2. He cannot receive Home Health care from [facility] due to his Psychiatric history, they are not qualified to resume regular medical care and therapy for him, due to restrictions that prevent them from caring for a psychiatric patient - given his diagnosis and history.  He was given a paper list of many local resources in [facility] that can provide additional [place] and or Psychiatric help in the community - you can start looking into these locations to see if any would  be available to him. ALSO - if not already tried can check with [facility] - for them to help you find the right help for him.  ?[facility] [address] [place],  [address]  Phone: [phone] [email or web address]  [name], [facility] 06/27/2018, 12:52 PM

## 2018-06-27 NOTE — Telephone Encounter (Signed)
All these information shared with Aaron Gibbs she doesn't have any question. She will call the office if she will have any question.

## 2018-07-02 ENCOUNTER — Emergency Department
Admission: EM | Admit: 2018-07-02 | Discharge: 2018-07-02 | Disposition: A | Discharge status: Home / Self Care | Payer: Medicare Other | Attending: Student in an Organized Health Care Education/Training Program | Admitting: Student in an Organized Health Care Education/Training Program

## 2018-07-02 ENCOUNTER — Emergency Department: Payer: Medicare Other

## 2018-07-02 ENCOUNTER — Other Ambulatory Visit: Payer: Self-pay

## 2018-07-02 DIAGNOSIS — J449 Chronic obstructive pulmonary disease, unspecified: Secondary | ICD-10-CM | POA: Diagnosis not present

## 2018-07-02 DIAGNOSIS — F332 Major depressive disorder, recurrent severe without psychotic features: Secondary | ICD-10-CM

## 2018-07-02 DIAGNOSIS — R0602 Shortness of breath: Secondary | ICD-10-CM | POA: Diagnosis not present

## 2018-07-02 DIAGNOSIS — F17228 Nicotine dependence, chewing tobacco, with other nicotine-induced disorders: Secondary | ICD-10-CM | POA: Insufficient documentation

## 2018-07-02 DIAGNOSIS — I10 Essential (primary) hypertension: Secondary | ICD-10-CM | POA: Diagnosis not present

## 2018-07-02 DIAGNOSIS — R440 Auditory hallucinations: Secondary | ICD-10-CM | POA: Insufficient documentation

## 2018-07-02 DIAGNOSIS — R0789 Other chest pain: Secondary | ICD-10-CM | POA: Insufficient documentation

## 2018-07-02 DIAGNOSIS — R443 Hallucinations, unspecified: Secondary | ICD-10-CM

## 2018-07-02 DIAGNOSIS — Z859 Personal history of malignant neoplasm, unspecified: Secondary | ICD-10-CM | POA: Diagnosis not present

## 2018-07-02 DIAGNOSIS — Z79899 Other long term (current) drug therapy: Secondary | ICD-10-CM | POA: Insufficient documentation

## 2018-07-02 DIAGNOSIS — Z7982 Long term (current) use of aspirin: Secondary | ICD-10-CM | POA: Diagnosis not present

## 2018-07-02 DIAGNOSIS — G894 Chronic pain syndrome: Secondary | ICD-10-CM

## 2018-07-02 LAB — BASIC METABOLIC PANEL
Anion gap: 7 (ref 5–15)
BUN: 35 mg/dL — ABNORMAL HIGH (ref 8–23)
CO2: 23 mmol/L (ref 22–32)
CREATININE: 0.88 mg/dL (ref 0.61–1.24)
Calcium: 9.4 mg/dL (ref 8.9–10.3)
Chloride: 112 mmol/L — ABNORMAL HIGH (ref 98–111)
GFR calc Af Amer: >60 mL/min (ref >60)
GFR calc non Af Amer: >60 mL/min (ref >60)
Glucose, Bld: 170 mg/dL — ABNORMAL HIGH (ref 70–99)
Potassium: 4.6 mmol/L (ref 3.5–5.1)
Sodium: 142 mmol/L (ref 135–145)

## 2018-07-02 LAB — CBC
HCT: 45.4 % (ref 39.0–52.0)
Hemoglobin: 14.4 g/dL (ref 13.0–17.0)
MCH: 28.9 pg (ref 26.0–34.0)
MCHC: 31.7 g/dL (ref 30.0–36.0)
MCV: 91 fL (ref 80.0–100.0)
NRBC: 0 % (ref 0.0–0.2)
Platelets: 229 10*3/uL (ref 150–400)
RBC: 4.99 MIL/uL (ref 4.22–5.81)
RDW: 14.6 % (ref 11.5–15.5)
WBC: 7.7 10*3/uL (ref 4.0–10.5)

## 2018-07-02 LAB — BLOOD GAS, VENOUS
ACID-BASE EXCESS: 0.5 mmol/L (ref 0.0–2.0)
Bicarbonate: 26 mmol/L (ref 20.0–28.0)
FIO2: 0.21
O2 Saturation: 70.8 %
PH VEN: 7.38 (ref 7.250–7.430)
Patient temperature: 37
pCO2, Ven: 44 mmHg (ref 44.0–60.0)
pO2, Ven: 38 mmHg (ref 32.0–45.0)

## 2018-07-02 LAB — URINALYSIS, COMPLETE (UACMP) WITH MICROSCOPIC
BACTERIA UA: NONE SEEN
Bilirubin Urine: NEGATIVE
Glucose, UA: NEGATIVE mg/dL
Hgb urine dipstick: NEGATIVE
Ketones, ur: NEGATIVE mg/dL
Leukocytes, UA: NEGATIVE
NITRITE: NEGATIVE
Protein, ur: NEGATIVE mg/dL
Specific Gravity, Urine: >1.046 — ABNORMAL HIGH (ref 1.005–1.030)
pH: 6 (ref 5.0–8.0)

## 2018-07-02 LAB — TROPONIN I: Troponin I: <0.03 ng/mL (ref <0.03)

## 2018-07-02 LAB — PROCALCITONIN

## 2018-07-02 MED ORDER — IPRATROPIUM-ALBUTEROL 0.5-2.5 (3) MG/3ML IN SOLN
3.0000 mL | Freq: Once | RESPIRATORY_TRACT | Status: AC
  Administered 2018-07-02: 3 mL via RESPIRATORY_TRACT
  Filled 2018-07-02: qty 3

## 2018-07-02 MED ORDER — IOHEXOL 350 MG/ML SOLN
75.0000 mL | Freq: Once | INTRAVENOUS | Status: AC | PRN
  Administered 2018-07-02: 75 mL via INTRAVENOUS

## 2018-07-02 MED ORDER — DULOXETINE HCL 30 MG PO CPEP: 30.0000 mg | ORAL_CAPSULE | Freq: Every day | ORAL | 1 refills | Status: DC

## 2018-07-02 MED ORDER — QUETIAPINE FUMARATE 25 MG PO TABS: 25.0000 mg | ORAL_TABLET | Freq: Every day | ORAL | 0 refills | Status: DC

## 2018-07-02 MED ORDER — SODIUM CHLORIDE 0.9 % IV BOLUS
500.0000 mL | Freq: Once | INTRAVENOUS | Status: AC
  Administered 2018-07-02: 500 mL via INTRAVENOUS

## 2018-07-02 NOTE — ED Provider Notes (Addendum)
[facility] Emergency Department Provider Note    First MD Initiated Contact with Patient 07/02/18 1513     (approximate)  I have reviewed the triage vital signs and the nursing notes.   HISTORY  Chief Complaint Shortness of Breath and Hallucinations    HPI Aaron Gibbs is a 84 y.o. male below listed past medical history presents for evaluation of shortness of breath and hallucinations for several days.  Arrives with caregiver states that they are worried that he is developed a urinary tract infection.  Patient was recently admitted and evaluated for [facility] admission for suicide attempt.  He denies any pain.  No fevers.  States been compliant with his medications.  States he is hallucinating seeing a cat in his room and also stating that he is seeing "flash paper "that he refers back to being in the [employer].    Past Medical History:  Diagnosis Date  . Anxiety   . Cancer ([place])   . COPD (chronic obstructive pulmonary disease) ([place])   . Glaucoma   . Osteoporosis    osteopenia   Family History  Problem Relation Age of Onset  . Depression Mother   . Heart disease Father    Past Surgical History:  Procedure Laterality Date  . CHOLECYSTECTOMY     Patient Active Problem List   Diagnosis Date Noted  . Suicide attempt ([place]) 06/26/2018  . Syncope and collapse 06/04/2018  . 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 Recurrent major depression-severe ([place]) 06/04/2018  . Blindness 05/10/2018  . Suicidal ideation 05/10/2018  . Chronic pain syndrome 05/01/2018  . DISH (diffuse idiopathic skeletal hyperostosis) 05/01/2018  . Osteoarthritis of multiple joints 05/01/2018  . Bilateral chronic knee pain 05/01/2018  . Retinitis pigmentosa of both eyes 05/01/2018  . Glaucoma of both eyes 05/01/2018  . Essential hypertension 05/01/2018  . Chronic low back pain 05/01/2018  . GERD (gastroesophageal reflux disease) 05/01/2018  . AVM (arteriovenous  malformation) of colon 05/01/2018  . Therapeutic opioid-induced constipation (OIC) 05/01/2018      Prior to Admission medications   Medication Sig Start Date End Date Taking? Authorizing Provider  amLODipine (NORVASC) 10 MG tablet Take 10 mg by mouth daily.    [provider]  aspirin EC 81 MG tablet Take 81 mg by mouth daily.    [provider]  doxycycline (VIBRA-TABS) 100 MG tablet Take 100 mg by mouth 2 (two) times daily. For ten days each month for chronic pneumonia prophylaxis.    [provider]  DULoxetine (CYMBALTA) 60 MG capsule Take 1 capsule (60 mg total) by mouth daily. 06/26/18   Karamalegos, Devonne Doughty, DO  fenofibrate (TRICOR) 145 MG tablet Take 145 mg by mouth daily.    [provider]  ferrous sulfate 325 (65 FE) MG tablet Take 325 mg by mouth daily with breakfast.    [provider]  meloxicam (MOBIC) 7.5 MG tablet Take 7.5 mg by mouth 2 (two) times daily.    [provider]  morphine (MS CONTIN) 15 MG 12 hr tablet Take 1 tablet (15 mg total) by mouth every 12 (twelve) hours. 05/01/18   Karamalegos, Devonne Doughty, DO  Multiple Vitamin (MULTIVITAMIN WITH MINERALS) TABS tablet Take 1 tablet by mouth daily.    [provider]  oxyCODONE-acetaminophen (PERCOCET) 10-325 MG tablet Take 1 tablet by mouth 3 (three) times daily.    [provider]  pantoprazole (PROTONIX) 40 MG tablet Take 40 mg by mouth 2 (two) times daily.  [provider]  polyethylene glycol (MIRALAX / GLYCOLAX) packet Take 17 g by mouth daily as needed for mild constipation.    [provider]  prednisoLONE acetate (PRED FORTE) 1 % ophthalmic suspension Place 1 drop into the left eye 2 (two) times daily.    [provider]  pregabalin (LYRICA) 100 MG capsule Take 100 mg by mouth 4 (four) times daily.    [provider]  senna (SENOKOT) 8.6 MG tablet Take 1 tablet by mouth daily.    [provider]    timolol (BETIMOL) 0.25 % ophthalmic solution 1-2 drops 2 (two) times daily.    [provider]  umeclidinium-vilanterol (ANORO ELLIPTA) 62.5-25 MCG/INH AEPB Inhale 1 puff into the lungs daily.    [provider]    Allergies Azithromycin    Social History Social History   Tobacco Use  . Smoking status: Former Smoker    Years: 15.00  . Smokeless tobacco: Current User    Types: Chew  Substance Use Topics  . Alcohol use: Not Currently    Comment: past  . Drug use: Never    Review of Systems Patient denies headaches, rhinorrhea, blurry vision, numbness, shortness of breath, chest pain, edema, cough, abdominal pain, nausea, vomiting, diarrhea, dysuria, fevers, rashes or hallucinations unless otherwise stated above in HPI. ____________________________________________   PHYSICAL EXAM:  VITAL SIGNS: Vitals:   07/02/18 1845 07/02/18 1846  BP: (!) 138/93   Pulse: 81   Resp:  14  Temp:    SpO2: 98%     Constitutional: Alert and oriented. Anxious appearing  Eyes: Conjunctivae are normal.  Head: Atraumatic. Nose: No congestion/rhinnorhea. Mouth/Throat: Mucous membranes are moist.   Neck: No stridor. Painless ROM.  Cardiovascular: Normal rate, regular rhythm. Grossly normal heart sounds.  Good peripheral circulation. Respiratory: Normal respiratory effort.  No retractions. Lungs with coarse bibasilar breathsounds Gastrointestinal: Soft and nontender. No distention. No abdominal bruits. No CVA tenderness. Genitourinary:  Musculoskeletal: No lower extremity tenderness nor edema.  No joint effusions. Neurologic:  Normal speech and language. No gross focal neurologic deficits are appreciated. No facial droop Skin:  Skin is warm, dry and intact. No rash noted. Psychiatric: Mood and affect are normal. Speech and behavior are normal.  ____________________________________________   LABS (all labs ordered are listed, but only abnormal results are  displayed)  Results for orders placed or performed during the hospital encounter of 07/02/18 (from the past 24 hour(s))  Basic metabolic panel     Status: Abnormal   Collection Time: 07/02/18  3:12 PM  Result Value Ref Range   Sodium 142 135 - 145 mmol/L   Potassium 4.6 3.5 - 5.1 mmol/L   Chloride 112 (H) 98 - 111 mmol/L   CO2 23 22 - 32 mmol/L   Glucose, Bld 170 (H) 70 - 99 mg/dL   BUN 35 (H) 8 - 23 mg/dL   Creatinine, Ser 0.88 0.61 - 1.24 mg/dL   Calcium 9.4 8.9 - 10.3 mg/dL   GFR calc non Af Amer >60 >60 mL/min   GFR calc Af Amer >60 >60 mL/min   Anion gap 7 5 - 15  CBC     Status: None   Collection Time: 07/02/18  3:12 PM  Result Value Ref Range   WBC 7.7 4.0 - 10.5 K/uL   RBC 4.99 4.22 - 5.81 MIL/uL   Hemoglobin 14.4 13.0 - 17.0 g/dL   HCT 45.4 39.0 - 52.0 %   MCV 91.0 80.0 - 100.0 fL  MCH 28.9 26.0 - 34.0 pg   MCHC 31.7 30.0 - 36.0 g/dL   RDW 14.6 11.5 - 15.5 %   Platelets 229 150 - 400 K/uL   nRBC 0.0 0.0 - 0.2 %  Troponin I - ONCE - STAT     Status: None   Collection Time: 07/02/18  3:12 PM  Result Value Ref Range   Troponin I <0.03 <0.03 ng/mL  Procalcitonin     Status: None   Collection Time: 07/02/18  3:12 PM  Result Value Ref Range   Procalcitonin <0.10 ng/mL  Blood gas, venous     Status: None   Collection Time: 07/02/18  3:57 PM  Result Value Ref Range   FIO2 0.21    Delivery systems ROOM AIR    pH, Ven 7.38 7.250 - 7.430   pCO2, Ven 44 44.0 - 60.0 mmHg   pO2, Ven 38.0 32.0 - 45.0 mmHg   Bicarbonate 26.0 20.0 - 28.0 mmol/L   Acid-Base Excess 0.5 0.0 - 2.0 mmol/L   O2 Saturation 70.8 %   Patient temperature 37.0    Collection site VENOUS    Sample type VENOUS   Urinalysis, Complete w Microscopic     Status: Abnormal   Collection Time: 07/02/18  5:27 PM  Result Value Ref Range   Color, Urine YELLOW (A) YELLOW   APPearance CLEAR (A) CLEAR   Specific Gravity, Urine >1.046 (H) 1.005 - 1.030   pH 6.0 5.0 - 8.0   Glucose, UA NEGATIVE NEGATIVE mg/dL    Hgb urine dipstick NEGATIVE NEGATIVE   Bilirubin Urine NEGATIVE NEGATIVE   Ketones, ur NEGATIVE NEGATIVE mg/dL   Protein, ur NEGATIVE NEGATIVE mg/dL   Nitrite NEGATIVE NEGATIVE   Leukocytes, UA NEGATIVE NEGATIVE   RBC / HPF 0-5 0 - 5 RBC/hpf   WBC, UA 0-5 0 - 5 WBC/hpf   Bacteria, UA NONE SEEN NONE SEEN   Squamous Epithelial / LPF 0-5 0 - 5   Mucus PRESENT    ____________________________________________  EKG My review and personal interpretation at Time: 15:10   Indication: sob  Rate: 80  Rhythm: sinus Axis: normal Other: normal intervals, no stemi ____________________________________________  RADIOLOGY  I personally reviewed all radiographic images ordered to evaluate for the above acute complaints and reviewed radiology reports and findings.  These findings were personally discussed with the patient.  Please see medical record for radiology report.  ____________________________________________   PROCEDURES  Procedure(s) performed:  Procedures    Critical Care performed: no ____________________________________________   INITIAL IMPRESSION / ASSESSMENT AND PLAN / ED COURSE  Pertinent labs & imaging results that were available during my care of the patient were reviewed by me and considered in my medical decision making (see chart for details).   DDX: copd, pna, chf, pe, anxiety, psychosis  Aaron Gibbs is a 84 y.o. who presents to the ED with symptoms as described above.  He is not hypoxic no fever.  Is reporting hallucinations and shortness of breath.  Will evaluate for cardiopulmonary abnormality given his history.  Hallucinations are concerning but the patient does not seem to be demonstrating any thoughts of self-harm or agitation.  Clinical Course as of Jul 02 2012  Tue Jul 02, 2018  1712 T imaging shows no evidence of PE or mass.  Patchy opacities reported as possible pneumonia however the patient has no fever hypoxia or white count.  Possible contusion.   However the patient was ambulated he did have some shortness of breath.  Still  awaiting urinalysis.  Pneumonia could explain some of his confusion and hallucinations.   [PR]  1844 Procalcitonin suggests against infection.  Do not feel this meets criteria for pneumonia.  Possible COPD exacerbation.   [PR]  8242 Patient reassessed.  According to family now at bedside he has been having hallucinations are concerned about medication interaction.  Denies any SI or HI and family is not worried about self-harm at this time but I do believe the psychiatric consultation would be beneficial at this time.  Does not appear to have any medical indication for admission at this time   [PR]  2012 Patient evaluated by tele-psychiatry who also feels the patient stable and appropriate for outpatient follow-up.  Has high suspicion for component of sundowning does not require hospitalization.  Will have psychiatry intake considered with family regarding options for day program and have also filled out home health request for additional home health services.   [PR]    Clinical Course User Index [PR] Merlyn Lot, MD     As part of my medical decision making, I reviewed the following data within the [place] notes reviewed and incorporated, Labs reviewed, notes from prior ED visits.   ____________________________________________   FINAL CLINICAL IMPRESSION(S) / ED DIAGNOSES  Final diagnoses:  Hallucinations      NEW MEDICATIONS STARTED DURING THIS VISIT:  New Prescriptions   No medications on file     Note:  This document was prepared using Dragon voice recognition software and may include unintentional dictation errors.    Merlyn Lot, MD 07/02/18 Lenny Pastel    Merlyn Lot, MD 07/02/18 2014

## 2018-07-02 NOTE — ED Notes (Signed)
Pts sats dropped to 89% while ambulating, averaging around 91%

## 2018-07-02 NOTE — ED Provider Notes (Signed)
Tele-psychiatry did call back and make further medication recommendations.  He is recommended the patient be started on 25 mg Seroquel nightly and decrease his duloxetine to 30 mg daily.  This was explained and gone over with family and patient at bedside.   Merlyn Lot, MD 07/02/18 2036

## 2018-07-02 NOTE — ED Notes (Signed)
Telepsych is set up in patients room.

## 2018-07-02 NOTE — ED Notes (Signed)
Patient transported to CT 

## 2018-07-02 NOTE — ED Notes (Signed)
Pt is waiting on paperwork from TTS before discharge can be done.

## 2018-07-02 NOTE — Clinical Social Work Note (Addendum)
Clinical Social Worker (CSW) received a call from patient's daughter-Lisa Huish [phone]) before patient arrived. Daughter stated patient's caregiver is bringing him to the hospital at this time. Daughter expressed concern regarding home health services. Daughter stating a referral was made during one of patient's ED visits, but services never started due to patient subsequent ED visit which led to inpatient psych placement. Daughter also states patient is hallucinating.   UPDATE 5:46pm: CSW received a call from ED Secretary Holley Raring stating daughter wants to speak with CSW. CSW staffed with Cristy Friedlander W.-patient still awaiting medical workup results.   CSW met with daughter at bedside. Daughter wanting to know about home health services being started. CSW explained patient is still being assessed and this request will be revisited when a plan of treatment is determined. CSW staffed with EDP Robinson-confirmed still awaiting medical workup results to determine treatment plan.    CSW remains available for consult, if needed.   Oretha Ellis, Latanya Presser, [place] Worker-Emergency Department [phone]

## 2018-07-02 NOTE — ED Triage Notes (Addendum)
Pt to ED via EMS from home. Pt c/o Sob x2 days. Pt recently fell and has bruising to chest and abd from fall. Pt has hx of anxiety and has been hallucinating thinking there is a cat in his home according to caregiver. Caregiver states he becomes more short of breath when he is hallucinating which causes anxiety- per ems. Pt c/o chest tightness. NAD.

## 2018-07-03 ENCOUNTER — Telehealth: Payer: Self-pay

## 2018-07-03 ENCOUNTER — Ambulatory Visit: Payer: Self-pay | Admitting: Family Medicine

## 2018-07-03 NOTE — Telephone Encounter (Signed)
Patient was seen in the ED yesterday for cognitive decline.  ED discharged patient home and ordered home health services.  Daughter Forrester Blando called this RNCM and left a message that she has not heard any information about home health services.  RNCM will attempt to arrange home health services and return family member call. Doran Clay RN BSN [phone]

## 2018-07-03 NOTE — Telephone Encounter (Signed)
RNCM arranged Home Health services SN, aide and SW with [name].  [name] was able to accept the referral and will send and nurse out on Saturday.  RNCM called Darden Palmer, the daughter and left message stating that home health services have been arranged.

## 2018-07-09 ENCOUNTER — Telehealth: Payer: Self-pay | Admitting: Family Medicine

## 2018-07-09 NOTE — Telephone Encounter (Signed)
Aaron Gibbs with [name] needs verbal orders for home health [phone]

## 2018-07-09 NOTE — Telephone Encounter (Signed)
Left message

## 2018-07-10 ENCOUNTER — Ambulatory Visit
Admission: RE | Admit: 2018-07-10 | Discharge: 2018-07-10 | Disposition: A | Discharge status: Home / Self Care | Payer: Medicare Other | Source: Ambulatory Visit | Attending: Nurse Practitioner | Admitting: Nurse Practitioner

## 2018-07-10 ENCOUNTER — Encounter: Payer: Self-pay | Admitting: Nurse Practitioner

## 2018-07-10 ENCOUNTER — Other Ambulatory Visit: Payer: Self-pay

## 2018-07-10 ENCOUNTER — Ambulatory Visit: Payer: Medicare Other | Admitting: Nurse Practitioner

## 2018-07-10 VITALS — BP 148/83 | HR 79 | Resp 16 | Ht 70.0 in | Wt 227.0 lb

## 2018-07-10 DIAGNOSIS — G8929 Other chronic pain: Secondary | ICD-10-CM

## 2018-07-10 DIAGNOSIS — M25561 Pain in right knee: Principal | ICD-10-CM

## 2018-07-10 DIAGNOSIS — Z79899 Other long term (current) drug therapy: Secondary | ICD-10-CM | POA: Insufficient documentation

## 2018-07-10 DIAGNOSIS — Z789 Other specified health status: Secondary | ICD-10-CM | POA: Insufficient documentation

## 2018-07-10 DIAGNOSIS — M25562 Pain in left knee: Principal | ICD-10-CM

## 2018-07-10 DIAGNOSIS — Z79891 Long term (current) use of opiate analgesic: Secondary | ICD-10-CM | POA: Insufficient documentation

## 2018-07-10 DIAGNOSIS — M899 Disorder of bone, unspecified: Secondary | ICD-10-CM

## 2018-07-10 DIAGNOSIS — G894 Chronic pain syndrome: Secondary | ICD-10-CM | POA: Insufficient documentation

## 2018-07-10 NOTE — Progress Notes (Signed)
Patient's Name: Aaron Gibbs  MRN: [id number]  Referring Provider: [name] *  DOB: Oct 31, 1934  PCP: Olin Hauser, DO  DOS: 07/10/2018  Note by: Dionisio David NP  Service setting: Ambulatory outpatient  Specialty: Interventional Pain Management  Location: [facility] (AMB) Pain Management Facility    Patient type: New Patient    Primary Reason(s) for Visit: Initial Patient Evaluation CC: Knee Pain (bilateral )  HPI  Aaron Gibbs is a 84 y.o. year old, male patient, who comes today for an initial evaluation. He has Chronic pain syndrome; DISH (diffuse idiopathic skeletal hyperostosis); Osteoarthritis of multiple joints; Bilateral chronic knee pain (Primary Area of Pain) (L>R); Retinitis pigmentosa of both eyes; Glaucoma of both eyes; Essential hypertension; Chronic low back pain; GERD (gastroesophageal reflux disease); AVM (arteriovenous malformation) of colon; Therapeutic opioid-induced constipation (OIC); Blindness; Suicidal ideation; Syncope and collapse; Somatic symptom disorder; Recurrent major depression-severe ([place]); Suicide attempt ([place]); Long term current use of opiate analgesic; Pharmacologic therapy; Disorder of skeletal system; and Problems influencing health status on their problem list.. His primarily concern today is the Knee Pain (bilateral )  Pain Assessment: Location: Left, Right Knee Radiating: denies Onset: More than a month ago Duration: Chronic pain Quality: Sore, Aching, Sharp, Discomfort, Constant Severity: 7 /10 (subjective, self-reported pain score)  Note: Reported level is compatible with observation. Clinically the patient looks like a 2/10 A 2/10 is viewed as "Mild to Moderate" and described as noticeable and distracting. Impossible to hide from other people. More frequent flare-ups. Still possible to adapt and function close to normal. It can be very annoying and may have occasional stronger flare-ups. With discipline, patients may get used to it  and adapt. Information on the proper use of the pain scale provided to the patient today. When using our objective Pain Scale, levels between 6 and 10/10 are said to belong in an emergency room, as it progressively worsens from a 6/10, described as severely limiting, requiring emergency care not usually available at an outpatient pain management facility. At a 6/10 level, communication becomes difficult and requires great effort. Assistance to reach the emergency department may be required. Facial flushing and profuse sweating along with potentially dangerous increases in heart rate and blood pressure will be evident. Effect on ADL: sleep disruption, limiting.  Timing: Constant Modifying factors: medications. BP: (!) 148/83  HR: 79  Onset and Duration: Date of onset: 10 years and Present longer than 3 months Cause of pain: Unknown Severity: Getting worse, NAS-11 at its worse: 8/10, NAS-11 at its best: 5/10, NAS-11 now: 8/10 and NAS-11 on the average: 8/10 Timing: Not influenced by the time of the day Aggravating Factors: Kneeling, Lifiting, Prolonged sitting, Prolonged standing, Squatting and Stooping  Alleviating Factors: Medications Associated Problems: Day-time cramps, Night-time cramps, Depression, Fatigue, Inability to concentrate, Suicidal ideations, Tingling, Weakness, Pain that wakes patient up and Pain that does not allow patient to sleep Quality of Pain: Agonizing, Burning, Constant, Disabling, Sharp, Shooting, Throbbing and Tiring Previous Examinations or Tests: CT scan, X-rays, Nerve conduction test, Neurological evaluation and Orthopedic evaluation Previous Treatments: Narcotic medications  The patient comes into the clinics today for the first time for a chronic pain management evaluation. According to the patient his primary area of pain is in his knees.  He admits that the left is greater than the right.  He denies swelling to the knees.  He admits that they just hurt.  He admits  that he does not feel secure on his knees.  He denies any  previous surgery.  He has had steroid injections and gel injections.  He feels like they were effective for a while.  His last injection was approximately 8 to 9 months ago.  He denies any recent physical therapy.  He denies any recent images.  Today I took the time to provide the patient with information regarding this pain practice. The patient was informed that the practice is divided into two sections: an interventional pain management section, as well as a completely separate and distinct medication management section. I explained that there are procedure days for interventional therapies, and evaluation days for follow-ups and medication management. Because of the amount of documentation required during both, they are kept separated. This means that there is the possibility that he may be scheduled for a procedure on one day, and medication management the next. I have also informed him that because of staffing and facility limitations, this practice will no longer take patients for medication management only. To illustrate the reasons for this, I gave the patient the example of surgeons, and how inappropriate it would be to refer a patient to his/her care, just to write for the post-surgical antibiotics on a surgery done by a different surgeon.   Because interventional pain management is part of the board-certified specialty for the doctors, the patient was informed that joining this practice means that they are open to any and all interventional therapies. I made it clear that this does not mean that they will be forced to have any procedures done. What this means is that I believe interventional therapies to be essential part of the diagnosis and proper management of chronic pain conditions. Therefore, patients not interested in these interventional alternatives will be better served under the care of a different practitioner.  The patient was  also made aware of my Comprehensive Pain Management Safety Guidelines where by joining this practice, they limit all of their nerve blocks and joint injections to those done by our practice, for as long as we are retained to manage their care. Historic Controlled Substance Pharmacotherapy Review  PMP and historical list of controlled substances: Limited PMP patient moved from [place] 2 months ago morphine extended release 15 g, oxycodone/acetaminophen 10/325 mg, pregabalin 100 mg, Highest opioid analgesic regimen found: Oxycodone/acetaminophen 10/325 mg 1 tablet 3 times daily plus morphine sulfate 15 mg twice daily (fill date 04/19/2018) Most recent opioid analgesic: Oxycodone/acetaminophen 10/325 mg 1 tablet 3 times daily plus morphine sulfate 15 mg twice daily (fill date 04/19/2018) oxycodone 30, morphine sulfate 30 mg Current opioid analgesics: Oxycodone/acetaminophen 10/325 mg 1 tablet 3 times daily plus morphine sulfate 15 mg twice daily (fill date 04/19/2018) oxycodone 30, morphine sulfate 30 mg  Highest recorded MME/day: 75 mg/day MME/day: 75 mg/day Medications: The patient did not bring the medication(s) to the appointment, as requested in our "New Patient Package" Pharmacodynamics: Desired effects: Analgesia: The patient reports >50% benefit. Reported improvement in function: The patient reports medication allows him to accomplish basic ADLs. Clinically meaningful improvement in function (CMIF): Sustained CMIF goals met Perceived effectiveness: Described as relatively effective, allowing for increase in activities of daily living (ADL) Undesirable effects: Side-effects or Adverse reactions: None reported Historical Monitoring: The patient  reports no history of drug use. List of all UDS Test(s): Lab Results  Component Value Date   MDMA POSITIVE (A) 05/10/2018   COCAINSCRNUR NONE DETECTED 06/12/2018   COCAINSCRNUR NONE DETECTED 05/10/2018   PCPSCRNUR NONE DETECTED 05/10/2018   THCU  NONE DETECTED 06/12/2018   THCU  NONE DETECTED 05/10/2018   ETH <10 06/12/2018   ETH <10 05/10/2018   List of all Serum Drug Screening Test(s):  No results found for: AMPHSCRSER, BARBSCRSER, BENZOSCRSER, COCAINSCRSER, PCPSCRSER, PCPQUANT, THCSCRSER, CANNABQUANT, OPIATESCRSER, OXYSCRSER, PROPOXSCRSER Historical Background Evaluation: [place] PDMP: Six (6) year initial data search conducted.             [place] Department of public safety, offender search: Editor, commissioning Information) Non-contributory Risk Assessment Profile: Aberrant behavior: None observed or detected today Risk factors for fatal opioid overdose: None identified today Fatal overdose hazard ratio (HR): Calculation deferred Non-fatal overdose hazard ratio (HR): Calculation deferred Risk of opioid abuse or dependence: 0.7-3.0% with doses ? 36 MME/day and 6.1-26% with doses ? 120 MME/day. Substance use disorder (SUD) risk level: Pending results of Medical Psychology Evaluation for SUD Opioid risk tool (ORT) (Total Score): 6  ORT Scoring interpretation table:  Score <3 = Low Risk for SUD  Score between 4-7 = Moderate Risk for SUD  Score >8 = High Risk for Opioid Abuse   PHQ-2 Depression Scale:  Total score:    PHQ-2 Scoring interpretation table: (Score and probability of major depressive disorder)  Score 0 = No depression  Score 1 = 15.4% Probability  Score 2 = 21.1% Probability  Score 3 = 38.4% Probability  Score 4 = 45.5% Probability  Score 5 = 56.4% Probability  Score 6 = 78.6% Probability   PHQ-9 Depression Scale:  Total score:    PHQ-9 Scoring interpretation table:  Score 0-4 = No depression  Score 5-9 = Mild depression  Score 10-14 = Moderate depression  Score 15-19 = Moderately severe depression  Score 20-27 = Severe depression (2.4 times higher risk of SUD and 2.89 times higher risk of overuse)   Pharmacologic Plan: Pending ordered tests and/or consults  Meds  The patient has a current medication list which includes  the following prescription(s): amlodipine, aspirin ec, doxycycline, duloxetine, fenofibrate, ferrous sulfate, meloxicam, morphine, multivitamin with minerals, oxycodone-acetaminophen, pantoprazole, polyethylene glycol, prednisolone acetate, pregabalin, quetiapine, senna, timolol, and umeclidinium-vilanterol.  Current Outpatient Medications on File Prior to Visit  Medication Sig  . amLODipine (NORVASC) 10 MG tablet Take 10 mg by mouth daily.  Marland Kitchen aspirin EC 81 MG tablet Take 81 mg by mouth daily.  Marland Kitchen doxycycline (VIBRA-TABS) 100 MG tablet Take 100 mg by mouth 2 (two) times daily. For ten days each month for chronic pneumonia prophylaxis.  . DULoxetine (CYMBALTA) 30 MG capsule Take 1 capsule (30 mg total) by mouth daily.  . fenofibrate (TRICOR) 145 MG tablet Take 145 mg by mouth daily.  . ferrous sulfate 325 (65 FE) MG tablet Take 325 mg by mouth daily with breakfast.  . meloxicam (MOBIC) 7.5 MG tablet Take 7.5 mg by mouth 2 (two) times daily.  Marland Kitchen morphine (MS CONTIN) 15 MG 12 hr tablet Take 1 tablet (15 mg total) by mouth every 12 (twelve) hours.  . Multiple Vitamin (MULTIVITAMIN WITH MINERALS) TABS tablet Take 1 tablet by mouth daily.  Marland Kitchen oxyCODONE-acetaminophen (PERCOCET) 10-325 MG tablet Take 1 tablet by mouth 3 (three) times daily.  . pantoprazole (PROTONIX) 40 MG tablet Take 40 mg by mouth 2 (two) times daily.  . polyethylene glycol (MIRALAX / GLYCOLAX) packet Take 17 g by mouth daily as needed for mild constipation.  . prednisoLONE acetate (PRED FORTE) 1 % ophthalmic suspension Place 1 drop into the left eye 2 (two) times daily.  . pregabalin (LYRICA) 100 MG capsule Take 100 mg by mouth 4 (four) times daily.  Marland Kitchen  QUEtiapine (SEROQUEL) 25 MG tablet Take 1 tablet (25 mg total) by mouth at bedtime.  . senna (SENOKOT) 8.6 MG tablet Take 1 tablet by mouth daily.  . timolol (BETIMOL) 0.25 % ophthalmic solution 1-2 drops 2 (two) times daily.  Marland Kitchen umeclidinium-vilanterol (ANORO ELLIPTA) 62.5-25 MCG/INH AEPB  Inhale 1 puff into the lungs daily.   No current facility-administered medications on file prior to visit.    Imaging Review  Note: Available results from prior imaging studies were reviewed.        ROS  Cardiovascular History: Daily Aspirin intake and High blood pressure Pulmonary or Respiratory History: Lung problems and Snoring  Neurological History: No reported neurological signs or symptoms such as seizures, abnormal skin sensations, urinary and/or fecal incontinence, being born with an abnormal open spine and/or a tethered spinal cord Review of Past Neurological Studies:  Results for orders placed or performed during the hospital encounter of 06/10/18  CT Head Wo Contrast   Narrative   CLINICAL DATA:  Visual loss or uveitis/scleritis  EXAM: CT HEAD WITHOUT CONTRAST  TECHNIQUE: Contiguous axial images were obtained from the base of the skull through the vertex without intravenous contrast.  COMPARISON:  CT head 05/10/2018  FINDINGS: Brain: Mild to moderate atrophy. Negative for hydrocephalus. Mild chronic microvascular ischemic change in the white matter.  Negative for acute infarct, hemorrhage, or mass.  Vascular: Negative for hyperdense vessel  Skull: Negative  Sinuses/Orbits: Mild mucosal edema paranasal sinuses. Bilateral cataract surgery.  Other: None  IMPRESSION: No acute abnormality. Atrophy and mild chronic microvascular ischemia in the white matter.   Electronically Signed   By: Franchot Gallo M.D.   On: 06/10/2018 15:17    Psychological-Psychiatric History: Anxiousness, Depressed, Suicidal ideations and Difficulty sleeping and or falling asleep Gastrointestinal History: Inflamed pancreas (Pancreatitis) Genitourinary History: No reported renal or genitourinary signs or symptoms such as difficulty voiding or producing urine, peeing blood, non-functioning kidney, kidney stones, difficulty emptying the bladder, difficulty controlling the flow of urine,  or chronic kidney disease Hematological History: No reported hematological signs or symptoms such as prolonged bleeding, low or poor functioning platelets, bruising or bleeding easily, hereditary bleeding problems, low energy levels due to low hemoglobin or being anemic Endocrine History: No reported endocrine signs or symptoms such as high or low blood sugar, rapid heart rate due to high thyroid levels, obesity or weight gain due to slow thyroid or thyroid disease Rheumatologic History: Joint aches and or swelling due to excess weight (Osteoarthritis) Musculoskeletal History: Negative for myasthenia gravis, muscular dystrophy, multiple sclerosis or malignant hyperthermia Work History: Retired  Allergies  Aaron Gibbs is allergic to azithromycin.  Laboratory Chemistry  Inflammation Markers Lab Results  Component Value Date   CRP 3.0 08/15/2016   ESRSEDRATE 12 08/15/2016   (CRP: Acute Phase) (ESR: Chronic Phase) Renal Function Markers Lab Results  Component Value Date   BUN 35 (H) 07/02/2018   CREATININE 0.88 07/02/2018   GFRAA >60 07/02/2018   GFRNONAA >60 07/02/2018   Hepatic Function Markers Lab Results  Component Value Date   AST 31 06/12/2018   ALT 25 06/12/2018   ALBUMIN 3.4 (L) 06/12/2018   ALKPHOS 39 06/12/2018   Electrolytes Lab Results  Component Value Date   NA 142 07/02/2018   K 4.6 07/02/2018   CL 112 (H) 07/02/2018   CALCIUM 9.4 07/02/2018   Neuropathy Markers Lab Results  Component Value Date   VITAMINB12 728 08/15/2016   Bone Pathology Markers Lab Results  Component Value Date  ALKPHOS 39 06/12/2018   VD25OH 26 08/15/2016   CALCIUM 9.4 07/02/2018   Coagulation Parameters Lab Results  Component Value Date   PLT 229 07/02/2018   Cardiovascular Markers Lab Results  Component Value Date   HGB 14.4 07/02/2018   HCT 45.4 07/02/2018   Note: Lab results reviewed.  [place]  Drug: Aaron Gibbs  reports no history of drug use. Alcohol:  reports  previous alcohol use. Tobacco:  reports that he has quit smoking. He quit after 15.00 years of use. His smokeless tobacco use includes chew. Medical:  has a past medical history of Anxiety, Cancer ([place]), COPD (chronic obstructive pulmonary disease) ([place]), Glaucoma, and Osteoporosis. Family: family history includes Depression in his mother; Heart disease in his father.  Past Surgical History:  Procedure Laterality Date  . CHOLECYSTECTOMY    . TRANSURETHRAL RESECTION OF PROSTATE N/A 2008   Active Ambulatory Problems    Diagnosis Date Noted  . Chronic pain syndrome 05/01/2018  . DISH (diffuse idiopathic skeletal hyperostosis) 05/01/2018  . Osteoarthritis of multiple joints 05/01/2018  . Bilateral chronic knee pain (Primary Area of Pain) (L>R) 05/01/2018  . Retinitis pigmentosa of both eyes 05/01/2018  . Glaucoma of both eyes 05/01/2018  . Essential hypertension 05/01/2018  . Chronic low back pain 05/01/2018  . GERD (gastroesophageal reflux disease) 05/01/2018  . 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 Therapeutic opioid-induced constipation (OIC) 05/01/2018  . Blindness 05/10/2018  . Suicidal ideation 05/10/2018  . Syncope and collapse 06/04/2018  . 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 Recurrent major depression-severe ([place]) 06/04/2018  . Suicide attempt ([place]) 06/26/2018  . Long term current use of opiate analgesic 07/10/2018  . Pharmacologic therapy 07/10/2018  . Disorder of skeletal system 07/10/2018  . Problems influencing health status 07/10/2018   Resolved Ambulatory Problems    Diagnosis Date Noted  . Centrilobular emphysema ([place]) 05/01/2018   Past Medical History:  Diagnosis Date  . Anxiety   . Cancer ([place])   . COPD (chronic obstructive pulmonary disease) ([place])   . Glaucoma   . Osteoporosis    Constitutional Exam  General appearance: Well nourished, well developed, and well hydrated. In no apparent acute distress Vitals:   07/10/18 1301  BP: (!)  148/83  Pulse: 79  Resp: 16  SpO2: 96%  Weight: 227 lb (103 kg)  Height: '5\' 10"'  (1.778 m)   BMI Assessment: Estimated body mass index is 32.57 kg/m as calculated from the following:   Height as of this encounter: '5\' 10"'  (1.778 m).   Weight as of this encounter: 227 lb (103 kg).  BMI interpretation table: BMI level Category Range association with higher incidence of chronic pain  <18 kg/m2 Underweight   18.5-24.9 kg/m2 Ideal body weight   25-29.9 kg/m2 Overweight Increased incidence by 20%  30-34.9 kg/m2 Obese (Class I) Increased incidence by 68%  35-39.9 kg/m2 Severe obesity (Class II) Increased incidence by 136%  >40 kg/m2 Extreme obesity (Class III) Increased incidence by 254%   BMI Readings from Last 4 Encounters:  07/10/18 32.57 kg/m  07/02/18 32.57 kg/m  06/26/18 32.57 kg/m  06/12/18 33.00 kg/m   Wt Readings from Last 4 Encounters:  07/10/18 227 lb (103 kg)  07/02/18 227 lb (103 kg)  06/26/18 227 lb (103 kg)  06/12/18 230 lb (104.3 kg)  Psych/Mental status: Alert, oriented x 3 (person, place, & time)       Eyes: PERLA Respiratory: No evidence of acute respiratory distress  Cervical Spine Exam  Inspection: No  masses, redness, or swelling Alignment: Symmetrical Functional ROM: Unrestricted ROM      Stability: No instability detected Muscle strength & Tone: Functionally intact Sensory: Unimpaired Palpation: No palpable anomalies              Upper Extremity (UE) Exam    Side: Right upper extremity  Side: Left upper extremity  Inspection: No masses, redness, swelling, or asymmetry. No contractures  Inspection: No masses, redness, swelling, or asymmetry. No contractures  Functional ROM: Unrestricted ROM          Functional ROM: Unrestricted ROM          Muscle strength & Tone: Functionally intact  Muscle strength & Tone: Functionally intact  Sensory: Unimpaired  Sensory: Unimpaired  Palpation: No palpable anomalies              Palpation: No palpable anomalies               Specialized Test(s): Deferred         Specialized Test(s): Deferred          Thoracic Spine Exam  Inspection: No masses, redness, or swelling Alignment: Symmetrical Functional ROM: Unrestricted ROM Stability: No instability detected Sensory: Unimpaired Muscle strength & Tone: No palpable anomalies  Lumbar Spine Exam  Inspection: No masses, redness, or swelling Alignment: Symmetrical Functional ROM: Unrestricted ROM      Stability: No instability detected Muscle strength & Tone: Functionally intact Sensory: Unimpaired Palpation: No palpable anomalies       Provocative Tests: Lumbar Hyperextension and rotation test: evaluation deferred today       Patrick's Maneuver: evaluation deferred today                    Gait & Posture Assessment  Ambulation: Patient ambulates using a walker Gait: Relatively normal for age and body habitus Posture: WNL   Lower Extremity Exam    Side: Right lower extremity  Side: Left lower extremity  Inspection: Multiple superficial abrasions  Inspection: Multiple superficial abrasion   Functional ROM: Adequate ROM          Functional ROM: Adequate ROM          Muscle strength & Tone: Functionally intact  Muscle strength & Tone: Functionally intact  Sensory: Unimpaired  Sensory: Unimpaired  Palpation: Non-tender  Palpation: Non-tender   Assessment  Primary Diagnosis & Pertinent Problem List: The primary encounter diagnosis was Bilateral chronic knee pain. Diagnoses of Chronic pain syndrome, Long term current use of opiate analgesic, Pharmacologic therapy, Disorder of skeletal system, and Problems influencing health status were also pertinent to this visit.  Visit Diagnosis: 1. Bilateral chronic knee pain   2. Chronic pain syndrome   3. Long term current use of opiate analgesic   4. Pharmacologic therapy   5. Disorder of skeletal system   6. Problems influencing health status    Plan of Care  Initial treatment plan:  Please be advised  that as per protocol, today's visit has been an evaluation only. We have not taken over the patient's controlled substance management.  Problem-specific plan: No problem-specific Assessment & Plan notes found for this encounter.  Ordered Lab-work, Procedure(s), Referral(s), & Consult(s): Orders Placed This Encounter  Procedures  . DG Knee 1-2 Views Left  . DG Knee 1-2 Views Right  . Compliance Drug Analysis, Ur  . Comp. Metabolic Panel (12)  . Magnesium  . Vitamin B12  . Sedimentation rate  . 25-Hydroxyvitamin D Lcms D2+D3  . C-reactive protein  .  Ambulatory referral to Physical Therapy   Pharmacotherapy: Medications ordered:  No orders of the defined types were placed in this encounter.  Medications administered during this visit: [name] had no medications administered during this visit.   Pharmacotherapy under consideration:  Opioid Analgesics: The patient was informed that there is no guarantee that he would be a candidate for opioid analgesics. The decision will be made following CDC guidelines. This decision will be based on the results of diagnostic studies, as well as Aaron Gibbs risk profile.  Membrane stabilizer: To be determined at a later time Muscle relaxant: To be determined at a later time NSAID: To be determined at a later time Other analgesic(s): To be determined at a later time   Interventional therapies under consideration: Aaron Gibbs was informed that there is no guarantee that he would be a candidate for interventional therapies. The decision will be based on the results of diagnostic studies, as well as Aaron Gibbs risk profile.  Possible procedure(s): Diagnostic bilateral intra-articular knee injection Diagnostic bilateral knee  Hyalgan series Diagnostic bilateral genicular nerve block Possible bilateral genicular nerve radiofrequency ablation   Provider-requested follow-up: Return for 2nd Visit, w/ Dr. Dossie Arbour.  Future Appointments   Date Time Provider [place]  07/11/2018 10:40 AM Olin Hauser, DO [place] None  07/24/2018  9:30 AM Milinda Pointer, MD [facility] None  08/01/2018  9:20 AM Parks Ranger, Devonne Doughty, DO [facility] None    Primary Care Physician: Olin Hauser, DO Location: [facility] Note by:  Date: 07/10/2018; Time: 2:55 PM  Pain Score Disclaimer: We use the NRS-11 scale. This is a self-reported, subjective measurement of pain severity with only modest accuracy. It is used primarily to identify changes within a particular patient. It must be understood that outpatient pain scales are significantly less accurate that those used for research, where they can be applied under ideal controlled circumstances with minimal exposure to variables. In reality, the score is likely to be a combination of pain intensity and pain affect, where pain affect describes the degree of emotional arousal or changes in action readiness caused by the sensory experience of pain. Factors such as social and work situation, setting, emotional state, anxiety levels, expectation, and prior pain experience may influence pain perception and show large inter-individual differences that may also be affected by time variables.  Patient instructions provided during this appointment: Patient Instructions   ____________________________________________________________________________________________  Appointment Policy Summary  It is our goal and responsibility to provide the medical community with assistance in the evaluation and management of patients with chronic pain. Unfortunately our resources are limited. Because we do not have an unlimited amount of time, or available appointments, we are required to closely monitor and manage their use. The following rules exist to maximize their use:  Patient's responsibilities: 1. Punctuality:  At what time should I arrive? You should be  physically present in our office 30 minutes before your scheduled appointment. Your scheduled appointment is with your assigned healthcare provider. However, it takes 5-10 minutes to be "checked-in", and another 15 minutes for the nurses to do the admission. If you arrive to our office at the time you were given for your appointment, you will end up being at least 20-25 minutes late to your appointment with the provider. 2. Tardiness:  What happens if I arrive only a few minutes after my scheduled appointment time? You will need to reschedule your appointment. The cutoff is your appointment time. This is why it is  so important that you arrive at least 30 minutes before that appointment. If you have an appointment scheduled for 10:00 AM and you arrive at 10:01, you will be required to reschedule your appointment.  3. Plan ahead:  Always assume that you will encounter traffic on your way in. Plan for it. If you are dependent on a driver, make sure they understand these rules and the need to arrive early. 4. Other appointments and responsibilities:  Avoid scheduling any other appointments before or after your pain clinic appointments.  5. Be prepared:  Write down everything that you need to discuss with your healthcare provider and give this information to the admitting nurse. Write down the medications that you will need refilled. Bring your pills and bottles (even the empty ones), to all of your appointments, except for those where a procedure is scheduled. 6. No children or pets:  Find someone to take care of them. It is not appropriate to bring them in. 7. Scheduling changes:  We request "advanced notification" of any changes or cancellations. 8. Advanced notification:  Defined as a time period of more than 24 hours prior to the originally scheduled appointment. This allows for the appointment to be offered to other patients. 9. Rescheduling:  When a visit is rescheduled, it will require the  cancellation of the original appointment. For this reason they both fall within the category of "Cancellations".  10. Cancellations:  They require advanced notification. Any cancellation less than 24 hours before the  appointment will be recorded as a "No Show". 11. No Show:  Defined as an unkept appointment where the patient failed to notify or declare to the practice their intention or inability to keep the appointment.  Corrective process for repeat offenders:  1. Tardiness: Three (3) episodes of rescheduling due to late arrivals will be recorded as one (1) "No Show". 2. Cancellation or reschedule: Three (3) cancellations or rescheduling will be recorded as one (1) "No Show". 3. "No Shows": Three (3) "No Shows" within a 12 month period will result in discharge from the practice. ____________________________________________________________________________________________   ______________________________________________________________________________________________  Specialty Pain Scale  Introduction:  There are significant differences in how pain is reported. The word pain usually refers to physical pain, but it is also a common synonym of suffering. The medical community uses a scale from 0 (zero) to 10 (ten) to report pain level. Zero (0) is described as "no pain", while ten (10) is described as "the worse pain you can imagine". The problem with this scale is that physical pain is reported along with suffering. Suffering refers to mental pain, or more often yet it refers to any unpleasant feeling, emotion or aversion associated with the perception of harm or threat of harm. It is the psychological component of pain.  Pain Specialists prefer to separate the two components. The pain scale used by this practice is the Verbal Numerical Rating Scale (VNRS-11). This scale is for the physical pain only. DO NOT INCLUDE how your pain psychologically affects you. This scale is for adults 86 years of  age and older. It has 11 (eleven) levels. The 1st level is 0/10. This means: "right now, I have no pain". In the context of pain management, it also means: "right now, my physical pain is under control with the current therapy".  General Information:  The scale should reflect your current level of pain. Unless you are specifically asked for the level of your worst pain, or your average pain. If you are asked  for one of these two, then it should be understood that it is over the past 24 hours.  Levels 1 (one) through 5 (five) are described below, and can be treated as an outpatient. Ambulatory pain management facilities such as ours are more than adequate to treat these levels. Levels 6 (six) through 10 (ten) are also described below, however, these must be treated as a hospitalized patient. While levels 6 (six) and 7 (seven) may be evaluated at an urgent care facility, levels 8 (eight) through 10 (ten) constitute medical emergencies and as such, they belong in a hospital's emergency department. When having these levels (as described below), do not come to our office. Our facility is not equipped to manage these levels. Go directly to an urgent care facility or an emergency department to be evaluated.  Definitions:  Activities of Daily Living (ADL): Activities of daily living (ADL or ADLs) is a term used in healthcare to refer to people's daily self-care activities. Health professionals often use a person's ability or inability to perform ADLs as a measurement of their functional status, particularly in regard to people post injury, with disabilities and the elderly. There are two ADL levels: Basic and Instrumental. Basic Activities of Daily Living (BADL  or BADLs) consist of self-care tasks that include: Bathing and showering; personal hygiene and grooming (including brushing/combing/styling hair); dressing; Toilet hygiene (getting to the toilet, cleaning oneself, and getting back up); eating and  self-feeding (not including cooking or chewing and swallowing); functional mobility, often referred to as "transferring", as measured by the ability to walk, get in and out of bed, and get into and out of a chair; the broader definition (moving from one place to another while performing activities) is useful for people with different physical abilities who are still able to get around independently. Basic ADLs include the things many people do when they get up in the morning and get ready to go out of the house: get out of bed, go to the toilet, bathe, dress, groom, and eat. On the average, loss of function typically follows a particular order. Hygiene is the first to go, followed by loss of toilet use and locomotion. The last to go is the ability to eat. When there is only one remaining area in which the person is independent, there is a 62.9% chance that it is eating and only a 3.5% chance that it is hygiene. Instrumental Activities of Daily Living (IADL or IADLs) are not necessary for fundamental functioning, but they let an individual live independently in a community. IADL consist of tasks that include: cleaning and maintaining the house; home establishment and maintenance; care of others (including selecting and supervising caregivers); care of pets; child rearing; managing money; managing financials (investments, etc.); meal preparation and cleanup; shopping for groceries and necessities; moving within the community; safety procedures and emergency responses; health management and maintenance (taking prescribed medications); and using the telephone or other form of communication.  Instructions:  Most patients tend to report their pain as a combination of two factors, their physical pain and their psychosocial pain. This last one is also known as "suffering" and it is reflection of how physical pain affects you socially and psychologically. From now on, report them separately.  From this point on, when  asked to report your pain level, report only your physical pain. Use the following table for reference.  Pain Clinic Pain Levels (0-5/10)  Pain Level Score  Description  No Pain 0   Mild pain 1  Nagging, annoying, but does not interfere with basic activities of daily living (ADL). Patients are able to eat, bathe, get dressed, toileting (being able to get on and off the toilet and perform personal hygiene functions), transfer (move in and out of bed or a chair without assistance), and maintain continence (able to control bladder and bowel functions). Blood pressure and heart rate are unaffected. A normal heart rate for a healthy adult ranges from 60 to 100 bpm (beats per minute).   Mild to moderate pain 2 Noticeable and distracting. Impossible to hide from other people. More frequent flare-ups. Still possible to adapt and function close to normal. It can be very annoying and may have occasional stronger flare-ups. With discipline, patients may get used to it and adapt.   Moderate pain 3 Interferes significantly with activities of daily living (ADL). It becomes difficult to feed, bathe, get dressed, get on and off the toilet or to perform personal hygiene functions. Difficult to get in and out of bed or a chair without assistance. Very distracting. With effort, it can be ignored when deeply involved in activities.   Moderately severe pain 4 Impossible to ignore for more than a few minutes. With effort, patients may still be able to manage work or participate in some social activities. Very difficult to concentrate. Signs of autonomic nervous system discharge are evident: dilated pupils (mydriasis); mild sweating (diaphoresis); sleep interference. Heart rate becomes elevated (>115 bpm). Diastolic blood pressure (lower number) rises above 100 mmHg. Patients find relief in laying down and not moving.   Severe pain 5 Intense and extremely unpleasant. Associated with frowning face and frequent crying. Pain  overwhelms the senses.  Ability to do any activity or maintain social relationships becomes significantly limited. Conversation becomes difficult. Pacing back and forth is common, as getting into a comfortable position is nearly impossible. Pain wakes you up from deep sleep. Physical signs will be obvious: pupillary dilation; increased sweating; goosebumps; brisk reflexes; cold, clammy hands and feet; nausea, vomiting or dry heaves; loss of appetite; significant sleep disturbance with inability to fall asleep or to remain asleep. When persistent, significant weight loss is observed due to the complete loss of appetite and sleep deprivation.  Blood pressure and heart rate becomes significantly elevated. Caution: If elevated blood pressure triggers a pounding headache associated with blurred vision, then the patient should immediately seek attention at an urgent or emergency care unit, as these may be signs of an impending stroke.    Emergency Department Pain Levels (6-10/10)  Emergency Room Pain 6 Severely limiting. Requires emergency care and should not be seen or managed at an outpatient pain management facility. Communication becomes difficult and requires great effort. Assistance to reach the emergency department may be required. Facial flushing and profuse sweating along with potentially dangerous increases in heart rate and blood pressure will be evident.   Distressing pain 7 Self-care is very difficult. Assistance is required to transport, or use restroom. Assistance to reach the emergency department will be required. Tasks requiring coordination, such as bathing and getting dressed become very difficult.   Disabling pain 8 Self-care is no longer possible. At this level, pain is disabling. The individual is unable to do even the most "basic" activities such as walking, eating, bathing, dressing, transferring to a bed, or toileting. Fine motor skills are lost. It is difficult to think clearly.    Incapacitating pain 9 Pain becomes incapacitating. Thought processing is no longer possible. Difficult to remember your own name. Control of  movement and coordination are lost.   The worst pain imaginable 10 At this level, most patients pass out from pain. When this level is reached, collapse of the autonomic nervous system occurs, leading to a sudden drop in blood pressure and heart rate. This in turn results in a temporary and dramatic drop in blood flow to the brain, leading to a loss of consciousness. Fainting is one of the body's self defense mechanisms. Passing out puts the brain in a calmed state and causes it to shut down for a while, in order to begin the healing process.    Summary: 1. Refer to this scale when providing [place] with your pain level. 2. Be accurate and careful when reporting your pain level. This will help with your care. 3. Over-reporting your pain level will lead to loss of credibility. 4. Even a level of 1/10 means that there is pain and will be treated at our facility. 5. High, inaccurate reporting will be documented as "Symptom Exaggeration", leading to loss of credibility and suspicions of possible secondary gains such as obtaining more narcotics, or wanting to appear disabled, for fraudulent reasons. 6. Only pain levels of 5 or below will be seen at our facility. 7. Pain levels of 6 and above will be sent to the Emergency Department and the appointment cancelled. ______________________________________________________________________________________________

## 2018-07-10 NOTE — Patient Instructions (Signed)
____________________________________________________________________________________________  Appointment Policy Summary  It is our goal and responsibility to provide the medical community with assistance in the evaluation and management of patients with chronic pain. Unfortunately our resources are limited. Because we do not have an unlimited amount of time, or available appointments, we are required to closely monitor and manage their use. The following rules exist to maximize their use:  Patient's responsibilities: 1. Punctuality:  At what time should I arrive? You should be physically present in our office 30 minutes before your scheduled appointment. Your scheduled appointment is with your assigned healthcare provider. However, it takes 5-10 minutes to be "checked-in", and another 15 minutes for the nurses to do the admission. If you arrive to our office at the time you were given for your appointment, you will end up being at least 20-25 minutes late to your appointment with the provider. 2. Tardiness:  What happens if I arrive only a few minutes after my scheduled appointment time? You will need to reschedule your appointment. The cutoff is your appointment time. This is why it is so important that you arrive at least 30 minutes before that appointment. If you have an appointment scheduled for 10:00 AM and you arrive at 10:01, you will be required to reschedule your appointment.  3. Plan ahead:  Always assume that you will encounter traffic on your way in. Plan for it. If you are dependent on a driver, make sure they understand these rules and the need to arrive early. 4. Other appointments and responsibilities:  Avoid scheduling any other appointments before or after your pain clinic appointments.  5. Be prepared:  Write down everything that you need to discuss with your healthcare provider and give this information to the admitting nurse. Write down the medications that you will need  refilled. Bring your pills and bottles (even the empty ones), to all of your appointments, except for those where a procedure is scheduled. 6. No children or pets:  Find someone to take care of them. It is not appropriate to bring them in. 7. Scheduling changes:  We request "advanced notification" of any changes or cancellations. 8. Advanced notification:  Defined as a time period of more than 24 hours prior to the originally scheduled appointment. This allows for the appointment to be offered to other patients. 9. Rescheduling:  When a visit is rescheduled, it will require the cancellation of the original appointment. For this reason they both fall within the category of "Cancellations".  10. Cancellations:  They require advanced notification. Any cancellation less than 24 hours before the  appointment will be recorded as a "No Show". 11. No Show:  Defined as an unkept appointment where the patient failed to notify or declare to the practice their intention or inability to keep the appointment.  Corrective process for repeat offenders:  1. Tardiness: Three (3) episodes of rescheduling due to late arrivals will be recorded as one (1) "No Show". 2. Cancellation or reschedule: Three (3) cancellations or rescheduling will be recorded as one (1) "No Show". 3. "No Shows": Three (3) "No Shows" within a 12 month period will result in discharge from the practice. ____________________________________________________________________________________________   ______________________________________________________________________________________________  Specialty Pain Scale  Introduction:  There are significant differences in how pain is reported. The word pain usually refers to physical pain, but it is also a common synonym of suffering. The medical community uses a scale from 0 (zero) to 10 (ten) to report pain level. Zero (0) is described as "no pain",   while ten (10) is described as "the worse pain  you can imagine". The problem with this scale is that physical pain is reported along with suffering. Suffering refers to mental pain, or more often yet it refers to any unpleasant feeling, emotion or aversion associated with the perception of harm or threat of harm. It is the psychological component of pain.  Pain Specialists prefer to separate the two components. The pain scale used by this practice is the Verbal Numerical Rating Scale (VNRS-11). This scale is for the physical pain only. DO NOT INCLUDE how your pain psychologically affects you. This scale is for adults 21 years of age and older. It has 11 (eleven) levels. The 1st level is 0/10. This means: "right now, I have no pain". In the context of pain management, it also means: "right now, my physical pain is under control with the current therapy".  General Information:  The scale should reflect your current level of pain. Unless you are specifically asked for the level of your worst pain, or your average pain. If you are asked for one of these two, then it should be understood that it is over the past 24 hours.  Levels 1 (one) through 5 (five) are described below, and can be treated as an outpatient. Ambulatory pain management facilities such as ours are more than adequate to treat these levels. Levels 6 (six) through 10 (ten) are also described below, however, these must be treated as a hospitalized patient. While levels 6 (six) and 7 (seven) may be evaluated at an urgent care facility, levels 8 (eight) through 10 (ten) constitute medical emergencies and as such, they belong in a hospital's emergency department. When having these levels (as described below), do not come to our office. Our facility is not equipped to manage these levels. Go directly to an urgent care facility or an emergency department to be evaluated.  Definitions:  Activities of Daily Living (ADL): Activities of daily living (ADL or ADLs) is a term used in healthcare to refer to  people's daily self-care activities. Health professionals often use a person's ability or inability to perform ADLs as a measurement of their functional status, particularly in regard to people post injury, with disabilities and the elderly. There are two ADL levels: Basic and Instrumental. Basic Activities of Daily Living (BADL  or BADLs) consist of self-care tasks that include: Bathing and showering; personal hygiene and grooming (including brushing/combing/styling hair); dressing; Toilet hygiene (getting to the toilet, cleaning oneself, and getting back up); eating and self-feeding (not including cooking or chewing and swallowing); functional mobility, often referred to as "transferring", as measured by the ability to walk, get in and out of bed, and get into and out of a chair; the broader definition (moving from one place to another while performing activities) is useful for people with different physical abilities who are still able to get around independently. Basic ADLs include the things many people do when they get up in the morning and get ready to go out of the house: get out of bed, go to the toilet, bathe, dress, groom, and eat. On the average, loss of function typically follows a particular order. Hygiene is the first to go, followed by loss of toilet use and locomotion. The last to go is the ability to eat. When there is only one remaining area in which the person is independent, there is a 62.9% chance that it is eating and only a 3.5% chance that it is hygiene. Instrumental Activities   of Daily Living (IADL or IADLs) are not necessary for fundamental functioning, but they let an individual live independently in a community. IADL consist of tasks that include: cleaning and maintaining the house; home establishment and maintenance; care of others (including selecting and supervising caregivers); care of pets; child rearing; managing money; managing financials (investments, etc.); meal preparation  and cleanup; shopping for groceries and necessities; moving within the community; safety procedures and emergency responses; health management and maintenance (taking prescribed medications); and using the telephone or other form of communication.  Instructions:  Most patients tend to report their pain as a combination of two factors, their physical pain and their psychosocial pain. This last one is also known as "suffering" and it is reflection of how physical pain affects you socially and psychologically. From now on, report them separately.  From this point on, when asked to report your pain level, report only your physical pain. Use the following table for reference.  Pain Clinic Pain Levels (0-5/10)  Pain Level Score  Description  No Pain 0   Mild pain 1 Nagging, annoying, but does not interfere with basic activities of daily living (ADL). Patients are able to eat, bathe, get dressed, toileting (being able to get on and off the toilet and perform personal hygiene functions), transfer (move in and out of bed or a chair without assistance), and maintain continence (able to control bladder and bowel functions). Blood pressure and heart rate are unaffected. A normal heart rate for a healthy adult ranges from 60 to 100 bpm (beats per minute).   Mild to moderate pain 2 Noticeable and distracting. Impossible to hide from other people. More frequent flare-ups. Still possible to adapt and function close to normal. It can be very annoying and may have occasional stronger flare-ups. With discipline, patients may get used to it and adapt.   Moderate pain 3 Interferes significantly with activities of daily living (ADL). It becomes difficult to feed, bathe, get dressed, get on and off the toilet or to perform personal hygiene functions. Difficult to get in and out of bed or a chair without assistance. Very distracting. With effort, it can be ignored when deeply involved in activities.   Moderately severe pain  4 Impossible to ignore for more than a few minutes. With effort, patients may still be able to manage work or participate in some social activities. Very difficult to concentrate. Signs of autonomic nervous system discharge are evident: dilated pupils (mydriasis); mild sweating (diaphoresis); sleep interference. Heart rate becomes elevated (>115 bpm). Diastolic blood pressure (lower number) rises above 100 mmHg. Patients find relief in laying down and not moving.   Severe pain 5 Intense and extremely unpleasant. Associated with frowning face and frequent crying. Pain overwhelms the senses.  Ability to do any activity or maintain social relationships becomes significantly limited. Conversation becomes difficult. Pacing back and forth is common, as getting into a comfortable position is nearly impossible. Pain wakes you up from deep sleep. Physical signs will be obvious: pupillary dilation; increased sweating; goosebumps; brisk reflexes; cold, clammy hands and feet; nausea, vomiting or dry heaves; loss of appetite; significant sleep disturbance with inability to fall asleep or to remain asleep. When persistent, significant weight loss is observed due to the complete loss of appetite and sleep deprivation.  Blood pressure and heart rate becomes significantly elevated. Caution: If elevated blood pressure triggers a pounding headache associated with blurred vision, then the patient should immediately seek attention at an urgent or emergency care unit, as   these may be signs of an impending stroke.    Emergency Department Pain Levels (6-10/10)  Emergency Room Pain 6 Severely limiting. Requires emergency care and should not be seen or managed at an outpatient pain management facility. Communication becomes difficult and requires great effort. Assistance to reach the emergency department may be required. Facial flushing and profuse sweating along with potentially dangerous increases in heart rate and blood pressure  will be evident.   Distressing pain 7 Self-care is very difficult. Assistance is required to transport, or use restroom. Assistance to reach the emergency department will be required. Tasks requiring coordination, such as bathing and getting dressed become very difficult.   Disabling pain 8 Self-care is no longer possible. At this level, pain is disabling. The individual is unable to do even the most "basic" activities such as walking, eating, bathing, dressing, transferring to a bed, or toileting. Fine motor skills are lost. It is difficult to think clearly.   Incapacitating pain 9 Pain becomes incapacitating. Thought processing is no longer possible. Difficult to remember your own name. Control of movement and coordination are lost.   The worst pain imaginable 10 At this level, most patients pass out from pain. When this level is reached, collapse of the autonomic nervous system occurs, leading to a sudden drop in blood pressure and heart rate. This in turn results in a temporary and dramatic drop in blood flow to the brain, leading to a loss of consciousness. Fainting is one of the body's self defense mechanisms. Passing out puts the brain in a calmed state and causes it to shut down for a while, in order to begin the healing process.    Summary: 1.   Refer to this scale when providing us with your pain level. 2.   Be accurate and careful when reporting your pain level. This will help with your care. 3.   Over-reporting your pain level will lead to loss of credibility. 4.   Even a level of 1/10 means that there is pain and will be treated at our facility. 5.   High, inaccurate reporting will be documented as "Symptom Exaggeration", leading to loss of credibility and suspicions of possible secondary gains such as obtaining more narcotics, or wanting to appear disabled, for fraudulent reasons. 6.   Only pain levels of 5 or below will be seen at our facility. 7.   Pain levels of 6 and above will be  sent to the Emergency Department and the appointment cancelled.  ______________________________________________________________________________________________     

## 2018-07-11 ENCOUNTER — Inpatient Hospital Stay: Payer: Self-pay | Admitting: Family Medicine

## 2018-07-11 NOTE — Progress Notes (Signed)
Results were reviewed and found to be: mildly abnormal  No acute injury or pathology identified  Review would suggest interventional pain management techniques may be of benefit 

## 2018-07-12 ENCOUNTER — Telehealth: Payer: Self-pay | Admitting: Family Medicine

## 2018-07-12 ENCOUNTER — Ambulatory Visit (INDEPENDENT_AMBULATORY_CARE_PROVIDER_SITE_OTHER): Payer: Medicare Other | Admitting: Family Medicine

## 2018-07-12 ENCOUNTER — Encounter: Payer: Self-pay | Admitting: Family Medicine

## 2018-07-12 VITALS — BP 168/84 | HR 70 | Temp 97.5°F | Resp 16 | Ht 70.0 in | Wt 233.0 lb

## 2018-07-12 DIAGNOSIS — M159 Polyosteoarthritis, unspecified: Secondary | ICD-10-CM

## 2018-07-12 DIAGNOSIS — F332 Major depressive disorder, recurrent severe without psychotic features: Secondary | ICD-10-CM | POA: Diagnosis not present

## 2018-07-12 DIAGNOSIS — G8929 Other chronic pain: Secondary | ICD-10-CM

## 2018-07-12 DIAGNOSIS — G894 Chronic pain syndrome: Secondary | ICD-10-CM | POA: Diagnosis not present

## 2018-07-12 DIAGNOSIS — M150 Primary generalized (osteo)arthritis: Secondary | ICD-10-CM

## 2018-07-12 DIAGNOSIS — M25562 Pain in left knee: Secondary | ICD-10-CM

## 2018-07-12 DIAGNOSIS — M25561 Pain in right knee: Secondary | ICD-10-CM

## 2018-07-12 MED ORDER — OXYCODONE-ACETAMINOPHEN 10-325 MG PO TABS: 1.0000 | ORAL_TABLET | Freq: Three times a day (TID) | ORAL | 0 refills | Status: DC

## 2018-07-12 MED ORDER — PREGABALIN 100 MG PO CAPS: 100.0000 mg | ORAL_CAPSULE | Freq: Four times a day (QID) | ORAL | 0 refills | Status: DC

## 2018-07-12 MED ORDER — QUETIAPINE FUMARATE 25 MG PO TABS: 25.0000 mg | ORAL_TABLET | Freq: Every day | ORAL | 0 refills | Status: DC

## 2018-07-12 MED ORDER — DULOXETINE HCL 30 MG PO CPEP: 30.0000 mg | ORAL_CAPSULE | Freq: Every day | ORAL | 0 refills | Status: DC

## 2018-07-12 NOTE — Assessment & Plan Note (Signed)
See A&P for chronic pain

## 2018-07-12 NOTE — Patient Instructions (Addendum)
Thank you for coming to the office today.  When you go to [facility] on Monday - discuss with them your medications and long term plan for rx refills.  - They should be able to refill and prescribe the Duloxetine (Cymbalta) 30mg  daily and the Quetiapine (Seroquel) 25mg  nightly - I have printed a script for both of these medications for a 1 month supply just incase you run low - but I request that [facility] prescribe these medicines longer term for you  - Sent rx 1 month supply Oxycodone 10/325 and Lyrica -   Please discuss your pain medications and procedure options available to you at your next Pain Management visit. Keep in mind, I cannot do the Oxycodone or Narcotic rx long term.  Please schedule a Follow-up Appointment to: Return in about 4 weeks (around 08/09/2018) for 1 month follow-up Pain / Mood.  If you have any other questions or concerns, please feel free to call the office or send a message through [facility]. You may also schedule an earlier appointment if necessary.  Additionally, you may be receiving a survey about your experience at our office within a few days to 1 week by e-mail or mail. We value your feedback.  [name], DO [facility]

## 2018-07-12 NOTE — Assessment & Plan Note (Signed)
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 Previously followed by [facility] Pain management in [place] Now established with [facility]  Plan Discussed chronic pain management plans, advised concerns with chronic opiate medications, he has completed his initial intake with [facility], and now awaiting next visit for determined management, I advised him that I do not offer long term pain management with opiates, but given high dose of opiate he is on am willing to bridge him now to last until next appointment - Re ordered Oxycodone-10/325 for 1 month - Re ordered Lyrica 100mg  QID Continue Meloxicam, SNRI Duloxetine  He should discuss future options procedural, non opioid and other interventions at next apt with pain management. He is open to non opioid therapy in future.

## 2018-07-12 NOTE — Progress Notes (Signed)
Subjective:    Patient ID: Aaron Gibbs, male    DOB: [birth year], 84 y.o.   MRN: [id number]  [name] is a 84 y.o. male presenting on 07/12/2018 for Knee Pain (wants to talk about medicine hasn't seen pain managment)   HPI   Follow-up Recurrent Major Depression, Severe, chronic - Last visit with me 06/26/18, for hospital follow-up for same problem, treated with continued Duloxetine 60mg  and advised on self referral info to local options, see prior notes for background information. - Interval update with established with [facility] with initial visit on Monday 1/27. He is unsure if both psychiatry and therapist.  - Today patient reports his mood is stable. He was lowered on Cymbalta at recent ED visit 1/14, from 60 down to 30mg  also started on Quetiapine 25mg  for sleep and mood by ED. He asks if can refill these - he has home health aide at home - See PHQ score below - Denies suicidal or homicidal ideation  Chronic Pain Syndrome / OA/DJD multiple joints / Bilateral knee pain Last visit 06/2018, see note, he was advised to keep apt w/ [facility] - Reviewed chart, he had initial intake apt on 07/10/18 with Dionisio David NP at [facility], they proceeded with initial screening and he will see Dr Consuela Mimes on 07/24/18 - he has 3-4 more days of Oxycodone, Morphine Sulfate he thinks he can function without it and doing okay. Needs refill of Oxycodone if he is to continue on this, and he will also need Lyrica rx, takes 4 times daily. - He has tried rooster comb injection as well in past with mixed results - He is asking about other options from pain management, and will try to keep optimistic about his pain, he is concerned about how it would be controlled off opiates, but he is interested to come off of opiates if possible in future - Denies any new injury fall trauma swelling redness of joint fever or chills    Depression screen [facility] 2/9 07/12/2018 06/26/2018 05/01/2018  Decreased Interest 1 2  0  Down, Depressed, Hopeless 1 2 0  PHQ - 2 Score 2 4 0  Altered sleeping 1 3 -  Tired, decreased energy 1 3 -  Change in appetite 1 3 -  Feeling bad or failure about yourself  1 1 -  Trouble concentrating 1 1 -  Moving slowly or fidgety/restless 0 0 -  Suicidal thoughts 0 0 -  PHQ-9 Score 7 15 -  Difficult doing work/chores Somewhat difficult Somewhat difficult -   GAD 7 : Generalized Anxiety Score 07/12/2018 05/01/2018  Nervous, Anxious, on Edge 1 0  Control/stop worrying 1 0  Worry too much - different things 1 0  Trouble relaxing 1 0  Restless 0 0  Easily annoyed or irritable 0 0  Afraid - awful might happen 1 0  Total GAD 7 Score 5 0  Anxiety Difficulty Somewhat difficult Not difficult at all     Social History   Tobacco Use  . Smoking status: Former Smoker    Years: 15.00  . Smokeless tobacco: Current User    Types: Chew  Substance Use Topics  . Alcohol use: Not Currently    Comment: past  . Drug use: Never    Review of Systems Per HPI unless specifically indicated above     Objective:    BP (!) 168/84   Pulse 70   Temp (!) 97.5 F (36.4 C) (Oral)   Resp 16  Ht 5\' 10"  (1.778 m)   Wt 233 lb (105.7 kg)   SpO2 97%   BMI 33.43 kg/m   Wt Readings from Last 3 Encounters:  07/12/18 233 lb (105.7 kg)  07/10/18 227 lb (103 kg)  07/02/18 227 lb (103 kg)    Physical Exam Vitals signs and nursing note reviewed.  Constitutional:      General: He is not in acute distress.    Appearance: He is well-developed.     Comments: Chronically ill 83 yr old male, mostly comfortable, cooperative, has walker  HENT:     Head: Normocephalic and atraumatic.  Eyes:     General:        Right eye: No discharge.        Left eye: No discharge.     Conjunctiva/sclera: Conjunctivae normal.  Neck:     Musculoskeletal: Normal range of motion and neck supple.  Cardiovascular:     Rate and Rhythm: Normal rate and regular rhythm.     Heart sounds: Normal heart sounds. No  murmur.  Pulmonary:     Effort: Pulmonary effort is normal. No respiratory distress.     Breath sounds: Normal breath sounds. No wheezing or rales.  Musculoskeletal:     Comments: Using walker for ambulation.  Able to stand from seated position.  Significant increased thoracic kyphosis on exam.  Generalized muscle hypertonicity bilateral paraspinal back muscles thoracic into lumbar.  Skin:    General: Skin is warm.     Findings: No erythema or rash.  Neurological:     Mental Status: He is alert and oriented to person, place, and time.  Psychiatric:        Behavior: Behavior normal.     Comments: Well groomed, good eye contact, normal speech and thoughts. Mood is improved.    Results for orders placed or performed in visit on 07/10/18  Comp. Metabolic Panel (12)  Result Value Ref Range   Glucose 89 65 - 99 mg/dL   BUN 25 8 - 27 mg/dL   Creatinine, Ser 0.90 0.76 - 1.27 mg/dL   GFR calc non Af Amer 80 >59 mL/min/1.73   GFR calc Af Amer 92 >59 mL/min/1.73   BUN/Creatinine Ratio 28 (H) 10 - 24   Sodium 140 134 - 144 mmol/L   Potassium 4.6 3.5 - 5.2 mmol/L   Chloride 102 96 - 106 mmol/L   Calcium 9.3 8.6 - 10.2 mg/dL   Total Protein 6.2 6.0 - 8.5 g/dL   Albumin 4.2 3.6 - 4.6 g/dL   Globulin, Total 2.0 1.5 - 4.5 g/dL   Albumin/Globulin Ratio 2.1 1.2 - 2.2   Bilirubin Total 0.2 0.0 - 1.2 mg/dL   Alkaline Phosphatase 65 39 - 117 IU/L   AST 17 0 - 40 IU/L  Magnesium  Result Value Ref Range   Magnesium 2.2 1.6 - 2.3 mg/dL  Vitamin B12  Result Value Ref Range   Vitamin B-12 793 232 - 1,245 pg/mL  Sedimentation rate  Result Value Ref Range   Sed Rate 20 0 - 30 mm/hr  25-Hydroxyvitamin D Lcms D2+D3  Result Value Ref Range   25-Hydroxy, Vitamin D WILL FOLLOW    25-Hydroxy, Vitamin D-2 WILL FOLLOW    25-Hydroxy, Vitamin D-3 WILL FOLLOW   C-reactive protein  Result Value Ref Range   CRP 5 0 - 10 mg/L      Assessment & Plan:   Problem List Items Addressed This Visit     Bilateral chronic knee pain (  Primary Area of Pain) (L>R) (Chronic)   Relevant Medications   DULoxetine (CYMBALTA) 30 MG capsule   pregabalin (LYRICA) 100 MG capsule   oxyCODONE-acetaminophen (PERCOCET) 10-325 MG tablet   Chronic pain syndrome    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 Previously followed by Ortho and Pain management in [place] Now established with [facility]  Plan Discussed chronic pain management plans, advised concerns with chronic opiate medications, he has completed his initial intake with [facility], and now awaiting next visit for determined management, I advised him that I do not offer long term pain management with opiates, but given high dose of opiate he is on am willing to bridge him now to last until next appointment - Re ordered Oxycodone-10/325 for 1 month - Re ordered Lyrica 100mg  QID Continue Meloxicam, SNRI Duloxetine  He should discuss future options procedural, non opioid and other interventions at next apt with pain management. He is open to non opioid therapy in future.      Relevant Medications   DULoxetine (CYMBALTA) 30 MG capsule   pregabalin (LYRICA) 100 MG capsule   oxyCODONE-acetaminophen (PERCOCET) 10-325 MG tablet   Osteoarthritis of multiple joints    See A&P for chronic pain      Relevant Medications   pregabalin (LYRICA) 100 MG capsule   oxyCODONE-acetaminophen (PERCOCET) 10-325 MG tablet   Recurrent major depression-severe (HCC) - Primary    Clinically significant improve depression now, on SNRI still but lower dose due to med issue, and seroquel as well, started in ED - His mood seems currently stable - No suicidal or homicidal ideation - Establish with [facility], initial apt Mon 1/27  Plan Agreed to print refill Duloxetine 30 and Seroquel 25 - given 1 month printed, he may use these or discuss meds further with Psychiatry at [facility]      Relevant Medications   DULoxetine (CYMBALTA) 30 MG  capsule   QUEtiapine (SEROQUEL) 25 MG tablet      Meds ordered this encounter  Medications  . DULoxetine (CYMBALTA) 30 MG capsule    Sig: Take 1 capsule (30 mg total) by mouth daily.    Dispense:  30 capsule    Refill:  0  . QUEtiapine (SEROQUEL) 25 MG tablet    Sig: Take 1 tablet (25 mg total) by mouth at bedtime.    Dispense:  30 tablet    Refill:  0  . pregabalin (LYRICA) 100 MG capsule    Sig: Take 1 capsule (100 mg total) by mouth 4 (four) times daily.    Dispense:  120 capsule    Refill:  0  . oxyCODONE-acetaminophen (PERCOCET) 10-325 MG tablet    Sig: Take 1 tablet by mouth 3 (three) times daily.    Dispense:  90 tablet    Refill:  0     Follow up plan: Return in about 4 weeks (around 08/09/2018) for 1 month follow-up Pain / Mood.   [name], [facility] 07/12/2018, 10:59 AM

## 2018-07-12 NOTE — Assessment & Plan Note (Signed)
Clinically significant improve depression now, on SNRI still but lower dose due to med issue, and seroquel as well, started in ED - His mood seems currently stable - No suicidal or homicidal ideation - Establish with [facility], initial apt Mon 1/27  Plan Agreed to print refill Duloxetine 30 and Seroquel 25 - given 1 month printed, he may use these or discuss meds further with Psychiatry at [facility]

## 2018-07-12 NOTE — Telephone Encounter (Signed)
Cindy with [name] called states pt  Decline PT ( told her pt have apt today at [facility])

## 2018-07-13 LAB — COMP. METABOLIC PANEL (12)
AST: 17 IU/L (ref 0–40)
Albumin/Globulin Ratio: 2.1 (ref 1.2–2.2)
Albumin: 4.2 g/dL (ref 3.6–4.6)
Alkaline Phosphatase: 65 IU/L (ref 39–117)
BUN/Creatinine Ratio: 28 — ABNORMAL HIGH (ref 10–24)
BUN: 25 mg/dL (ref 8–27)
Bilirubin Total: 0.2 mg/dL (ref 0.0–1.2)
Calcium: 9.3 mg/dL (ref 8.6–10.2)
Chloride: 102 mmol/L (ref 96–106)
Creatinine, Ser: 0.9 mg/dL (ref 0.76–1.27)
GFR calc Af Amer: 92 mL/min/{1.73_m2} (ref >59)
GFR, EST NON AFRICAN AMERICAN: 80 mL/min/{1.73_m2} (ref >59)
Globulin, Total: 2 g/dL (ref 1.5–4.5)
Glucose: 89 mg/dL (ref 65–99)
POTASSIUM: 4.6 mmol/L (ref 3.5–5.2)
Sodium: 140 mmol/L (ref 134–144)
Total Protein: 6.2 g/dL (ref 6.0–8.5)

## 2018-07-13 LAB — 25-HYDROXY VITAMIN D LCMS D2+D3
25-Hydroxy, Vitamin D-2: <1 ng/mL
25-Hydroxy, Vitamin D-3: 34 ng/mL
25-Hydroxy, Vitamin D: 34 ng/mL

## 2018-07-13 LAB — MAGNESIUM: Magnesium: 2.2 mg/dL (ref 1.6–2.3)

## 2018-07-13 LAB — C-REACTIVE PROTEIN: CRP: 5 mg/L (ref 0–10)

## 2018-07-13 LAB — VITAMIN B12: Vitamin B-12: 793 pg/mL (ref 232–1245)

## 2018-07-13 LAB — SEDIMENTATION RATE: SED RATE: 20 mm/h (ref 0–30)

## 2018-07-16 LAB — COMPLIANCE DRUG ANALYSIS, UR

## 2018-07-23 NOTE — Progress Notes (Signed)
Patient's Name: Aaron Gibbs  MRN: [id number]  Referring Provider: Nobie Gibbs *  DOB: 1934-11-13  PCP: Aaron Hauser, DO  DOS: 07/24/2018  Note by: Aaron Cola, MD  Service setting: Ambulatory outpatient  Specialty: Interventional Pain Management  Location: [facility] (AMB) Pain Management Facility    Patient type: Established   Primary Reason(s) for Visit: Encounter for evaluation before starting new chronic pain management plan of care (Level of risk: moderate) CC: Knee Pain  HPI  Aaron Gibbs is a 84 y.o. year old, male patient, who comes today for a follow-up evaluation to review the test results and decide on a treatment plan. He has Chronic pain syndrome; DISH (diffuse idiopathic skeletal hyperostosis); Osteoarthritis of multiple joints; Chronic knee pain (Primary Area of Pain) (Bilateral) (L>R); Retinitis pigmentosa of both eyes; Glaucoma of both eyes; Essential hypertension; Chronic low back pain (Bilateral) w/o sciatica; GERD (gastroesophageal reflux disease); AVM (arteriovenous malformation) of colon; Therapeutic opioid-induced constipation (OIC); Blindness; Suicidal ideation; Syncope and collapse; Somatic symptom disorder; Recurrent major depression-severe ([place]); Suicide attempt ([place]) (05/13/18); Long term current use of opiate analgesic; Pharmacologic therapy; Disorder of skeletal system; Problems influencing health status; Atherosclerotic peripheral vascular disease ([place]); Tricompartment osteoarthritis of knee (Left); Osteoarthritis of knee (Bilateral); Osteoarthritis of patellofemoral joint (Right); and History of suicide attempt (06/12/18) on their problem list. His primarily concern today is the Knee Pain  Pain Assessment: Location: Right, Left(left knee is the worse) Knee Radiating: Denies Onset: More than a month ago Duration: Chronic pain Quality: Sore, Sharp, Discomfort Severity: 7 /10 (subjective, self-reported pain score)  Note: Reported level is  inconsistent with clinical observations. Clinically the patient looks like a 4/10 A 4/10 is viewed as "Moderately Severe" and described as impossible to ignore for more than a few minutes. With effort, patients may still be able to manage work or participate in some social activities. Very difficult to concentrate. Signs of autonomic nervous system discharge are evident: dilated pupils (mydriasis); mild sweating (diaphoresis); sleep interference. Heart rate becomes elevated (>115 bpm). Diastolic blood pressure (lower number) rises above 100 mmHg. Patients find relief in laying down and not moving. Information on the proper use of the pain scale provided to the patient today. When using our objective Pain Scale, levels between 6 and 10/10 are said to belong in an emergency room, as it progressively worsens from a 6/10, described as severely limiting, requiring emergency care not usually available at an outpatient pain management facility. At a 6/10 level, communication becomes difficult and requires great effort. Assistance to reach the emergency department may be required. Facial flushing and profuse sweating along with potentially dangerous increases in heart rate and blood pressure will be evident. Effect on ADL: pain will not let me sleep Timing: Constant Modifying factors: medications BP: 127/70  HR: 65  Aaron Gibbs comes in today for a follow-up visit after his initial evaluation on 07/10/2018. Today we went over the results of his tests. These were explained in "Layman's terms". During today's appointment we went over my diagnostic impression, as well as the proposed treatment plan.  According to the patient his primary area of pain is in his knees (B) (L>R).  He admits that the left is greater than the right.  He denies swelling to the knees.  He admits that they just hurt.  He admits that he does not feel secure on his knees.  He denies any previous surgery.  He has had steroid injections and gel  injections.  He feels like they  were effective for a while.  His last injection was approximately 8 to 9 months ago.  He denies any recent physical therapy.  He denies any recent images.  In considering the treatment plan options, Aaron Gibbs was reminded that I no longer take patients for medication management only. I asked him to let me know if he had no intention of taking advantage of the interventional therapies, so that we could make arrangements to provide this space to someone interested. I also made it clear that undergoing interventional therapies for the purpose of getting pain medications is very inappropriate on the part of a patient, and it will not be tolerated in this practice. This type of behavior would suggest true addiction and therefore it requires referral to an addiction specialist.   Further details on both, my assessment(s), as well as the proposed treatment plan, please see below.  Controlled Substance Pharmacotherapy Assessment REMS (Risk Evaluation and Mitigation Strategy)  Analgesic: Oxycodone/acetaminophen 10/325 mg 1 tablet 3 times daily (30 mg/day of oxycodone) (45 MME/day) (last filled on 06/22/2018) + morphine sulfate 15 mg twice daily (30 mg/day of morphine) (30 MME/day) (last filled on 06/22/2018) Highest recorded MME/day: 75 mg/day MME/day: 75 mg/day  Pill Count: None expected due to no prior prescriptions written by our practice. No notes on file Pharmacokinetics: Liberation and absorption (onset of action): WNL Distribution (time to peak effect): WNL Metabolism and excretion (duration of action): WNL         Pharmacodynamics: Desired effects: Analgesia: Aaron Gibbs reports >50% benefit. Functional ability: Patient reports that medication allows him to accomplish basic ADLs Clinically meaningful improvement in function (CMIF): Sustained CMIF goals met Perceived effectiveness: Described as relatively effective, allowing for increase in activities of daily  living (ADL) Undesirable effects: Side-effects or Adverse reactions: None reported Monitoring: [place] PMP: Online review of the past 65-monthperiod previously conducted. Not applicable at this point since we have not taken over the patient's medication management yet. List of other Serum/Urine Drug Screening Test(s):  Lab Results  Component Value Date   COCAINSCRNUR NONE DETECTED 06/12/2018   COCAINSCRNUR NONE DETECTED 05/10/2018   THCU NONE DETECTED 06/12/2018   THCU NONE DETECTED 05/10/2018   ETH <10 06/12/2018   ETH <10 05/10/2018   List of all UDS test(s) done:  Lab Results  Component Value Date   SUMMARY FINAL 07/10/2018   Last UDS on record: Summary  Date Value Ref Range Status  07/10/2018 FINAL  Final    Comment:    ==================================================================== TOXASSURE COMP DRUG ANALYSIS,UR ==================================================================== Test                             Result       Flag       Units Drug Present and Declared for Prescription Verification   Morphine                       >>[id number]      EXPECTED   ng/mg creat   Normorphine                    221          EXPECTED   ng/mg creat    Potential sources of large amounts of morphine in the absence of    codeine include administration of morphine or use of heroin.    Normorphine is an expected metabolite of morphine.   Hydromorphone  88           EXPECTED   ng/mg creat    Hydromorphone may be present as a metabolite of morphine;    concentrations of hydromorphone rarely exceed 5% of the morphine    concentration when this is the source of hydromorphone.   Oxycodone                      1094         EXPECTED   ng/mg creat   Oxymorphone                    1028         EXPECTED   ng/mg creat   Noroxycodone                   3205         EXPECTED   ng/mg creat   Noroxymorphone                 323          EXPECTED   ng/mg creat    Sources of oxycodone are  scheduled prescription medications.    Oxymorphone, noroxycodone, and noroxymorphone are expected    metabolites of oxycodone. Oxymorphone is also available as a    scheduled prescription medication.   Duloxetine                     PRESENT      EXPECTED   Quetiapine                     PRESENT      EXPECTED   Acetaminophen                  PRESENT      EXPECTED Drug Present not Declared for Prescription Verification   Chlorpheniramine               PRESENT      UNEXPECTED   Hydroxyzine                    PRESENT      UNEXPECTED Drug Absent but Declared for Prescription Verification   Pregabalin                     Not Detected UNEXPECTED   Salicylate                     Not Detected UNEXPECTED    Aspirin, as indicated in the declared medication list, is not    always detected even when used as directed. ==================================================================== Test                      Result    Flag   Units      Ref Range   Creatinine              94               mg/dL      >=20 ==================================================================== Declared Medications:  The flagging and interpretation on this report are based on the  following declared medications.  Unexpected results may arise from  inaccuracies in the declared medications.  **Note: The testing scope of this panel includes these medications:  Duloxetine  Morphine (Morphine Sulfate)  Oxycodone (Oxycodone Acetaminophen)  Pregabalin  Quetiapine  **Note: The testing scope of this  panel does not include small to  moderate amounts of these reported medications:  Acetaminophen (Oxycodone Acetaminophen)  Aspirin  **Note: The testing scope of this panel does not include following  reported medications:  Amlodipine (Norvasc)  Doxycycline  Fenofibrate  Iron (Ferrous Sulfate)  Meloxicam  Multivitamin  Pantoprazole (Protonix)  Polyethylene Glycol  Prednisolone  Sennosides  Timolol  Umeclidinium (Anoro  Ellipta)  Vilanterol (Anoro Ellipta) ==================================================================== For clinical consultation, please call [phone]. ====================================================================    UDS interpretation: No unexpected findings.          Medication Assessment Form: Patient introduced to form today Treatment compliance: Treatment may start today if patient agrees with proposed plan. Evaluation of compliance is not applicable at this point Risk Assessment Profile: Aberrant behavior: See initial evaluations. None observed or detected today Comorbid factors increasing risk of overdose: See initial evaluation. No additional risks detected today Opioid risk tool (ORT) (Total Score): 6 Personal History of Substance Abuse (SUD-Substance use disorder):  Alcohol: Negative  Illegal Drugs: Negative  Rx Drugs: Negative  ORT Risk Level calculation: Moderate Risk Risk of substance use disorder (SUD): Moderate Opioid Risk Tool - 07/24/18 0942      Family History of Substance Abuse   Alcohol  Positive Male    Illegal Drugs  Negative    Rx Drugs  Negative      Personal History of Substance Abuse   Alcohol  Negative    Illegal Drugs  Negative    Rx Drugs  Negative      Age   Age between 73-45 years   No      History of Preadolescent Sexual Abuse   History of Preadolescent Sexual Abuse  Negative or Male      Psychological Disease   Psychological Disease  Positive    ADD  Negative    OCD  Negative    Bipolar  Negative    Schizophrenia  Negative    Depression  Positive      Total Score   Opioid Risk Tool Scoring  6    Opioid Risk Interpretation  Moderate Risk      ORT Scoring interpretation table:  Score <3 = Low Risk for SUD  Score between 4-7 = Moderate Risk for SUD  Score >8 = High Risk for Opioid Abuse   Risk Mitigation Strategies:  Patient opioid safety counseling: Completed today. Counseling provided to patient as per "Patient  Counseling Document". Document signed by patient, attesting to counseling and understanding Patient-Prescriber Agreement (PPA): Obtained today.  Controlled substance notification to other providers: Written and sent today.  Pharmacologic Plan: Unfortunately, the patient recently attempted suicide and therefore I do not feel comfortable with taking over his opioid regimen.  However, he does have clinical indications for the use of these medications and therefore I will be sending him to have a psychiatric evaluation.  I will be asking the psychiatrist to evaluate and treat his severe depression, assess him for the risk of suicide, and if he/she feels comfortable with it, today I will provide recommendations on the medication, dose, and regimen that I would suggest using.  The goal would be to get the pain under control and then discontinue the opioids, if possible.             Laboratory Chemistry  Inflammation Markers (CRP: Acute Phase) (ESR: Chronic Phase) Lab Results  Component Value Date   CRP 5 07/10/2018   ESRSEDRATE 20 07/10/2018   LATICACIDVEN 1.2 05/10/2018  Rheumatology Markers No results found.  Renal Function Markers Lab Results  Component Value Date   BUN 25 07/10/2018   CREATININE 0.90 07/10/2018   BCR 28 (H) 07/10/2018   GFRAA 92 07/10/2018   GFRNONAA 80 07/10/2018                             Hepatic Function Markers Lab Results  Component Value Date   AST 17 07/10/2018   ALT 25 06/12/2018   ALBUMIN 4.2 07/10/2018   ALKPHOS 65 07/10/2018   LIPASE 24 06/04/2018                        Electrolytes Lab Results  Component Value Date   NA 142 07/30/2018   K 4.6 07/30/2018   CL 111 07/30/2018   CALCIUM 9.4 07/30/2018   MG 2.2 07/10/2018                        Neuropathy Markers Lab Results  Component Value Date   [id number] 716 07/10/2018                        CNS Tests No results found.  Bone Pathology Markers Lab Results   Component Value Date   VD25OH 26 08/15/2016   25OHVITD1 34 07/10/2018   25OHVITD2 <1.0 07/10/2018   25OHVITD3 34 07/10/2018                         Coagulation Parameters Lab Results  Component Value Date   PLT 224 07/30/2018                        Cardiovascular Markers Lab Results  Component Value Date   TROPONINI <0.03 07/30/2018   HGB 14.4 07/30/2018   HCT 46.0 07/30/2018                         CA Markers No results found.  Endocrine Markers Lab Results  Component Value Date   TSH 2.237 06/10/2018                        Note: Lab results reviewed.  Recent Diagnostic Imaging Review  Knee Imaging: Knee-R DG 1-2 views:  Results for orders placed during the hospital encounter of 07/10/18  DG Knee 1-2 Views Right   Narrative CLINICAL DATA:  Chronic knee pain.  EXAM: RIGHT KNEE - 1-2 VIEW  COMPARISON:  No recent.  FINDINGS: Soft tissue structures are unremarkable. Mild patellofemoral and medial compartment degenerative change. No acute bony abnormality identified. No evidence of fracture or dislocation. Peripheral vascular calcification.  IMPRESSION: 1.  Mild patellofemoral and medial compartment degenerative change.  2.  No acute bony abnormality.  3.  Peripheral vascular disease.   Electronically Signed   By: Marcello Moores  Register   On: 07/11/2018 07:56    Knee-L DG 1-2 views:  Results for orders placed during the hospital encounter of 07/10/18  DG Knee 1-2 Views Left   Narrative CLINICAL DATA:  Chronic knee pain.  EXAM: LEFT KNEE - 1-2 VIEW  COMPARISON:  No recent.  FINDINGS: No acute bony or joint abnormality. Diffuse mild Tricompartment degenerative change. Peripheral vascular calcification.  IMPRESSION: 1. No acute abnormality. Diffuse mild tricompartment degenerative change.  2.  Peripheral vascular  disease.   Electronically Signed   By: Marcello Moores  Register   On: 07/11/2018 07:54    Complexity Note: Imaging results reviewed.  Results shared with Mr. Frimpong, using Layman's terms.                         Meds   Current Outpatient Medications:  .  amLODipine (NORVASC) 10 MG tablet, Take 10 mg by mouth daily., Disp: , Rfl:  .  aspirin EC 81 MG tablet, Take 81 mg by mouth daily., Disp: , Rfl:  .  DULoxetine (CYMBALTA) 30 MG capsule, Take 1 capsule (30 mg total) by mouth daily., Disp: 30 capsule, Rfl: 0 .  fenofibrate (TRICOR) 145 MG tablet, Take 145 mg by mouth daily., Disp: , Rfl:  .  ferrous sulfate 325 (65 FE) MG tablet, Take 325 mg by mouth daily with breakfast., Disp: , Rfl:  .  meloxicam (MOBIC) 7.5 MG tablet, Take 7.5 mg by mouth 2 (two) times daily., Disp: , Rfl:  .  Multiple Vitamin (MULTIVITAMIN WITH MINERALS) TABS tablet, Take 1 tablet by mouth daily., Disp: , Rfl:  .  oxyCODONE-acetaminophen (PERCOCET) 10-325 MG tablet, Take 1 tablet by mouth 3 (three) times daily., Disp: 90 tablet, Rfl: 0 .  pantoprazole (PROTONIX) 40 MG tablet, Take 40 mg by mouth 2 (two) times daily., Disp: , Rfl:  .  polyethylene glycol (MIRALAX / GLYCOLAX) packet, Take 17 g by mouth daily as needed for mild constipation., Disp: , Rfl:  .  prednisoLONE acetate (PRED FORTE) 1 % ophthalmic suspension, Place 1 drop into the left eye 2 (two) times daily., Disp: , Rfl:  .  pregabalin (LYRICA) 100 MG capsule, Take 1 capsule (100 mg total) by mouth 4 (four) times daily., Disp: 120 capsule, Rfl: 0 .  senna (SENOKOT) 8.6 MG tablet, Take 1 tablet by mouth daily., Disp: , Rfl:  .  timolol (BETIMOL) 0.25 % ophthalmic solution, 1-2 drops 2 (two) times daily., Disp: , Rfl:  .  umeclidinium-vilanterol (ANORO ELLIPTA) 62.5-25 MCG/INH AEPB, Inhale 1 puff into the lungs daily., Disp: , Rfl:  .  doxycycline (VIBRAMYCIN) 100 MG capsule, Take 1 capsule (100 mg total) by mouth 2 (two) times daily., Disp: 20 capsule, Rfl: 0 .  QUEtiapine (SEROQUEL) 25 MG tablet, TAKE 1 TABLET BY MOUTH AT BEDTIME (Patient taking differently: Take 25 mg by mouth at bedtime. ),  Disp: 30 tablet, Rfl: 0  ROS  Constitutional: Denies any fever or chills Gastrointestinal: No reported hemesis, hematochezia, vomiting, or acute GI distress Musculoskeletal: Denies any acute onset joint swelling, redness, loss of ROM, or weakness Neurological: No reported episodes of acute onset apraxia, aphasia, dysarthria, agnosia, amnesia, paralysis, loss of coordination, or loss of consciousness  Allergies  Mr. Keyworth is allergic to azithromycin.  [place]  Drug: Mr. Shinsato  reports no history of drug use. Alcohol:  reports previous alcohol use. Tobacco:  reports that he has quit smoking. He quit after 15.00 years of use. His smokeless tobacco use includes chew. Medical:  has a past medical history of Anxiety, Cancer ([place]), COPD (chronic obstructive pulmonary disease) ([place]), Glaucoma, and Osteoporosis. Surgical: Mr. Fauver  has a past surgical history that includes Cholecystectomy and Transurethral resection of prostate (N/A, 2008). Family: family history includes Depression in his mother; Heart disease in his father.  Constitutional Exam  General appearance: Well nourished, well developed, and well hydrated. In no apparent acute distress Vitals:   07/24/18 0932  BP: 127/70  Pulse: 77  Temp: 97.7 F (36.5 C)  SpO2: 97%  Weight: 220 lb (99.8 kg)  Height: '5\' 10"'  (1.778 m)   BMI Assessment: Estimated body mass index is 31.57 kg/m as calculated from the following:   Height as of this encounter: '5\' 10"'  (1.778 m).   Weight as of this encounter: 220 lb (99.8 kg).  BMI interpretation table: BMI level Category Range association with higher incidence of chronic pain  <18 kg/m2 Underweight   18.5-24.9 kg/m2 Ideal body weight   25-29.9 kg/m2 Overweight Increased incidence by 20%  30-34.9 kg/m2 Obese (Class I) Increased incidence by 68%  35-39.9 kg/m2 Severe obesity (Class II) Increased incidence by 136%  >40 kg/m2 Extreme obesity (Class III) Increased incidence by 254%    Patient's current BMI Ideal Body weight  Body mass index is 31.57 kg/m. Ideal body weight: 73 kg (160 lb 15 oz) Adjusted ideal body weight: 83.7 kg (184 lb 9 oz)   BMI Readings from Last 4 Encounters:  07/24/18 31.57 kg/m  07/12/18 33.43 kg/m  07/10/18 32.57 kg/m  07/02/18 32.57 kg/m   Wt Readings from Last 4 Encounters:  07/24/18 220 lb (99.8 kg)  07/12/18 233 lb (105.7 kg)  07/10/18 227 lb (103 kg)  07/02/18 227 lb (103 kg)  Psych/Mental status: Alert, oriented x 3 (person, place, & time)       Eyes: PERLA Respiratory: No evidence of acute respiratory distress  Cervical Spine Area Exam  Skin & Axial Inspection: No masses, redness, edema, swelling, or associated skin lesions Alignment: Symmetrical Functional ROM: Unrestricted ROM      Stability: No instability detected Muscle Tone/Strength: Functionally intact. No obvious neuro-muscular anomalies detected. Sensory (Neurological): Unimpaired Palpation: No palpable anomalies              Upper Extremity (UE) Exam    Side: Right upper extremity  Side: Left upper extremity  Skin & Extremity Inspection: Skin color, temperature, and hair growth are WNL. No peripheral edema or cyanosis. No masses, redness, swelling, asymmetry, or associated skin lesions. No contractures.  Skin & Extremity Inspection: Skin color, temperature, and hair growth are WNL. No peripheral edema or cyanosis. No masses, redness, swelling, asymmetry, or associated skin lesions. No contractures.  Functional ROM: Unrestricted ROM          Functional ROM: Unrestricted ROM          Muscle Tone/Strength: Functionally intact. No obvious neuro-muscular anomalies detected.  Muscle Tone/Strength: Functionally intact. No obvious neuro-muscular anomalies detected.  Sensory (Neurological): Unimpaired          Sensory (Neurological): Unimpaired          Palpation: No palpable anomalies              Palpation: No palpable anomalies              Provocative Test(s):   Phalen's test: deferred Tinel's test: deferred Apley's scratch test (touch opposite shoulder):  Action 1 (Across chest): deferred Action 2 (Overhead): deferred Action 3 (LB reach): deferred   Provocative Test(s):  Phalen's test: deferred Tinel's test: deferred Apley's scratch test (touch opposite shoulder):  Action 1 (Across chest): deferred Action 2 (Overhead): deferred Action 3 (LB reach): deferred    Thoracic Spine Area Exam  Skin & Axial Inspection: Significant thoracic kyphosis Alignment: Symmetrical Functional ROM: Unrestricted ROM Stability: No instability detected Muscle Tone/Strength: Functionally intact. No obvious neuro-muscular anomalies detected. Sensory (Neurological): Unimpaired Muscle strength & Tone: No palpable anomalies  Lumbar Spine Area Exam  Skin & Axial  Inspection: No masses, redness, or swelling Alignment: Symmetrical Functional ROM: Minimal ROM       Stability: No instability detected Muscle Tone/Strength: Functionally intact. No obvious neuro-muscular anomalies detected. Sensory (Neurological): Unimpaired Palpation: No palpable anomalies       Provocative Tests: Hyperextension/rotation test: deferred today       Lumbar quadrant test (Kemp's test): deferred today       Lateral bending test: deferred today       Patrick's Maneuver: deferred today                   FABER* test: deferred today                   S-I anterior distraction/compression test: deferred today         S-I lateral compression test: deferred today         S-I Thigh-thrust test: deferred today         S-I Gaenslen's test: deferred today         *(Flexion, ABduction and External Rotation)  Gait & Posture Assessment  Ambulation: Patient ambulates using a walker Gait: Significantly limited. Dependent on assistive device to ambulate Posture: Difficulty standing up straight, due to pain   Lower Extremity Exam    Side: Right lower extremity  Side: Left lower extremity   Stability: No instability observed          Stability: No instability observed          Skin & Extremity Inspection: Skin color, temperature, and hair growth are WNL. No peripheral edema or cyanosis. No masses, redness, swelling, asymmetry, or associated skin lesions. No contractures.  Skin & Extremity Inspection: Skin color, temperature, and hair growth are WNL. No peripheral edema or cyanosis. No masses, redness, swelling, asymmetry, or associated skin lesions. No contractures.  Functional ROM: Decreased ROM for hip and knee joints          Functional ROM: Decreased ROM for hip and knee joints          Muscle Tone/Strength: Functionally intact. No obvious neuro-muscular anomalies detected.  Muscle Tone/Strength: Functionally intact. No obvious neuro-muscular anomalies detected.  Sensory (Neurological): Articular pain pattern        Sensory (Neurological): Articular pain pattern        DTR: Patellar: deferred today Achilles: deferred today Plantar: deferred today  DTR: Patellar: deferred today Achilles: deferred today Plantar: deferred today  Palpation: No palpable anomalies  Palpation: No palpable anomalies   Assessment & Plan  Primary Diagnosis & Pertinent Problem List: The primary encounter diagnosis was Chronic pain syndrome. Diagnoses of Chronic knee pain (Primary Area of Pain) (Bilateral) (L>R), Osteoarthritis of knee (Bilateral), Osteoarthritis of patellofemoral joint (Right), Tricompartment osteoarthritis of knee (Left), Chronic low back pain (Bilateral) w/o sciatica, DISH (diffuse idiopathic skeletal hyperostosis), Pharmacologic therapy, Long term current use of opiate analgesic, Disorder of skeletal system, Problems influencing health status, Suicidal ideation, and History of suicide attempt (06/12/18) were also pertinent to this visit.  Visit Diagnosis: 1. Chronic pain syndrome   2. Chronic knee pain (Primary Area of Pain) (Bilateral) (L>R)   3. Osteoarthritis of knee (Bilateral)    4. Osteoarthritis of patellofemoral joint (Right)   5. Tricompartment osteoarthritis of knee (Left)   6. Chronic low back pain (Bilateral) w/o sciatica   7. DISH (diffuse idiopathic skeletal hyperostosis)   8. Pharmacologic therapy   9. Long term current use of opiate analgesic   10. Disorder of skeletal system   11.  Problems influencing health status   12. Suicidal ideation   13. History of suicide attempt (06/12/18)    Problems updated and reviewed during this visit: No problems updated.  Plan of Care  Pharmacotherapy (Medications Ordered): Meds ordered this encounter  Medications  . DISCONTD: diazepam (VALIUM) 5 MG tablet    Sig: Take 1-2 tablets (5-10 mg total) by mouth once for 1 dose. Avoid opioids 8 hours before and/or after of takingmedications.    Dispense:  2 tablet    Refill:  0    Must have driver. Take 1 tab 45 and 5 minutes before scan. (Take second pill only if needed.)    Procedure Orders     KNEE INJECTION Lab Orders  No laboratory test(s) ordered today    Imaging Orders     MR KNEE RIGHT WO CONTRAST     MR KNEE LEFT WO CONTRAST  Referral Orders     Ambulatory referral to Orthopedic Surgery     Ambulatory referral to Psychiatry  Pharmacological management options:  Opioid Analgesics: I will not be prescribing any opioids at this time Membrane stabilizer: We have discussed the possibility of optimizing this mode of therapy, if tolerated Muscle relaxant: We have discussed the possibility of a trial NSAID: We have discussed the possibility of a trial Other analgesic(s): To be determined at a later time   Interventional management options: Planned, scheduled, and/or pending:    Therapeutic bilateral intra-articular Hyalgan knee injection #1   Considering:   Diagnostic/therapeutic bilateral intra-articular knee injection with local anesthetic and steroids  Therapeutic, series of 5, bilateral intra-articular Hyalgan knee injections  Diagnostic  bilateral genicular nerve block  Possible bilateral genicular nerve RFA    PRN Procedures:   None at this time   Provider-requested follow-up: Return for Procedure (no sedation): (B) Hyalgan #1.  Future Appointments  Date Time Provider [place]  08/01/2018  9:00 AM Milinda Pointer, MD [facility] None  08/01/2018  1:30 PM Madaline Savage, PT [facility] None  08/05/2018 10:00 AM [facility]  08/05/2018 11:00 AM [facility]  08/06/2018  1:45 PM Joneen Boers R, PT [facility] None  08/08/2018  4:00 PM Madaline Savage, PT [facility] None  08/13/2018  1:00 PM Joneen Boers R, PT [facility] None  08/15/2018  3:15 PM Joneen Boers R, PT [facility] None  08/20/2018  1:00 PM Joneen Boers R, PT [facility] None  08/22/2018  1:00 PM Joneen Boers R, PT [facility] None  08/27/2018  1:00 PM Joneen Boers R, PT [facility] None  08/29/2018  1:30 PM Joneen Boers R, PT [facility] None  09/03/2018  1:00 PM Joneen Boers R, PT [facility] None  09/05/2018  1:00 PM Madaline Savage, PT [facility] None  09/10/2018  1:00 PM Madaline Savage, PT [facility] None  09/12/2018  1:00 PM Madaline Savage, PT [facility] None  09/17/2018  1:00 PM Madaline Savage, PT [facility] None    Primary Care Physician: Aaron Hauser, DO Location: [name] Note by: Aaron Cola, MD Date: 07/24/2018; Time: 10:56 AM

## 2018-07-24 ENCOUNTER — Encounter: Payer: Self-pay | Admitting: Pain Medicine

## 2018-07-24 ENCOUNTER — Other Ambulatory Visit: Payer: Self-pay

## 2018-07-24 ENCOUNTER — Ambulatory Visit: Payer: Medicare Other | Attending: Pain Medicine | Admitting: Pain Medicine

## 2018-07-24 VITALS — BP 127/70 | HR 77 | Temp 97.7°F | Ht 70.0 in | Wt 220.0 lb

## 2018-07-24 DIAGNOSIS — M25562 Pain in left knee: Secondary | ICD-10-CM | POA: Diagnosis present

## 2018-07-24 DIAGNOSIS — M4810 Ankylosing hyperostosis [Forestier], site unspecified: Secondary | ICD-10-CM

## 2018-07-24 DIAGNOSIS — Z79891 Long term (current) use of opiate analgesic: Secondary | ICD-10-CM

## 2018-07-24 DIAGNOSIS — R45851 Suicidal ideations: Secondary | ICD-10-CM | POA: Diagnosis present

## 2018-07-24 DIAGNOSIS — M170 Bilateral primary osteoarthritis of knee: Secondary | ICD-10-CM | POA: Diagnosis present

## 2018-07-24 DIAGNOSIS — M1712 Unilateral primary osteoarthritis, left knee: Secondary | ICD-10-CM | POA: Diagnosis present

## 2018-07-24 DIAGNOSIS — M1711 Unilateral primary osteoarthritis, right knee: Secondary | ICD-10-CM | POA: Insufficient documentation

## 2018-07-24 DIAGNOSIS — Z79899 Other long term (current) drug therapy: Secondary | ICD-10-CM

## 2018-07-24 DIAGNOSIS — Z9151 Personal history of suicidal behavior: Secondary | ICD-10-CM | POA: Insufficient documentation

## 2018-07-24 DIAGNOSIS — Z789 Other specified health status: Secondary | ICD-10-CM | POA: Insufficient documentation

## 2018-07-24 DIAGNOSIS — G8929 Other chronic pain: Secondary | ICD-10-CM | POA: Diagnosis present

## 2018-07-24 DIAGNOSIS — M25561 Pain in right knee: Secondary | ICD-10-CM

## 2018-07-24 DIAGNOSIS — Z915 Personal history of self-harm: Secondary | ICD-10-CM | POA: Insufficient documentation

## 2018-07-24 DIAGNOSIS — G894 Chronic pain syndrome: Secondary | ICD-10-CM

## 2018-07-24 DIAGNOSIS — M545 Low back pain: Secondary | ICD-10-CM | POA: Insufficient documentation

## 2018-07-24 DIAGNOSIS — M899 Disorder of bone, unspecified: Secondary | ICD-10-CM | POA: Diagnosis present

## 2018-07-24 DIAGNOSIS — I70209 Unspecified atherosclerosis of native arteries of extremities, unspecified extremity: Secondary | ICD-10-CM | POA: Insufficient documentation

## 2018-07-24 MED ORDER — DIAZEPAM 5 MG PO TABS: 5.0000 mg | ORAL_TABLET | Freq: Once | ORAL | 0 refills | Status: DC

## 2018-07-24 NOTE — Patient Instructions (Addendum)
____________________________________________________________________________________________  Preparing for your procedure (without sedation)  Instructions: . Oral Intake: Do not eat or drink anything for at least 3 hours prior to your procedure. . Transportation: Unless otherwise stated by your physician, you may drive yourself after the procedure. . Blood Pressure Medicine: Take your blood pressure medicine with a sip of water the morning of the procedure. . Blood thinners: Notify our staff if you are taking any blood thinners. Depending on which one you take, there will be specific instructions on how and when to stop it. . Diabetics on insulin: Notify the staff so that you can be scheduled 1st case in the morning. If your diabetes requires high dose insulin, take only  of your normal insulin dose the morning of the procedure and notify the staff that you have done so. . Preventing infections: Shower with an antibacterial soap the morning of your procedure.  . Build-up your immune system: Take 1000 mg of Vitamin C with every meal (3 times a day) the day prior to your procedure. Marland Kitchen Antibiotics: Inform the staff if you have a condition or reason that requires you to take antibiotics before dental procedures. . Pregnancy: If you are pregnant, call and cancel the procedure. . Sickness: If you have a cold, fever, or any active infections, call and cancel the procedure. . Arrival: You must be in the facility at least 30 minutes prior to your scheduled procedure. . Children: Do not bring any children with you. . Dress appropriately: Bring dark clothing that you would not mind if they get stained. . Valuables: Do not bring any jewelry or valuables.  Procedure appointments are reserved for interventional treatments only. Marland Kitchen No Prescription Refills. . No medication changes will be discussed during procedure appointments. . No disability issues will be discussed.  Reasons to call and reschedule or  cancel your procedure: (Following these recommendations will minimize the risk of a serious complication.) . Surgeries: Avoid having procedures within 2 weeks of any surgery. (Avoid for 2 weeks before or after any surgery). . Flu Shots: Avoid having procedures within 2 weeks of a flu shots or . (Avoid for 2 weeks before or after immunizations). . Barium: Avoid having a procedure within 7-10 days after having had a radiological study involving the use of radiological contrast. (Myelograms, Barium swallow or enema study). . Heart attacks: Avoid any elective procedures or surgeries for the initial 6 months after a "Myocardial Infarction" (Heart Attack). . Blood thinners: It is imperative that you stop these medications before procedures. Let us know if you if you take any blood thinner.  . Infection: Avoid procedures during or within two weeks of an infection (including chest colds or gastrointestinal problems). Symptoms associated with infections include: Localized redness, fever, chills, night sweats or profuse sweating, burning sensation when voiding, cough, congestion, stuffiness, runny nose, sore throat, diarrhea, nausea, vomiting, cold or Flu symptoms, recent or current infections. It is specially important if the infection is over the area that we intend to treat. Marland Kitchen Heart and lung problems: Symptoms that may suggest an active cardiopulmonary problem include: cough, chest pain, breathing difficulties or shortness of breath, dizziness, ankle swelling, uncontrolled high or unusually low blood pressure, and/or palpitations. If you are experiencing any of these symptoms, cancel your procedure and contact your primary care physician for an evaluation.  Remember:  Regular Business hours are:  Monday to Thursday 8:00 AM to 4:00 PM  Provider's Schedule: Milinda Pointer, MD:  Procedure days: Tuesday and Thursday 7:30 AM to  4:00 PM  Gillis Santa, MD:  Procedure days: Monday and Wednesday 7:30 AM to 4:00  PM ____________________________________________________________________________________________    ______________________________________________________________________________________________  Specialty Pain Scale  Introduction:  There are significant differences in how pain is reported. The word pain usually refers to physical pain, but it is also a common synonym of suffering. The medical community uses a scale from 0 (zero) to 10 (ten) to report pain level. Zero (0) is described as "no pain", while ten (10) is described as "the worse pain you can imagine". The problem with this scale is that physical pain is reported along with suffering. Suffering refers to mental pain, or more often yet it refers to any unpleasant feeling, emotion or aversion associated with the perception of harm or threat of harm. It is the psychological component of pain.  Pain Specialists prefer to separate the two components. The pain scale used by this practice is the Verbal Numerical Rating Scale (VNRS-11). This scale is for the physical pain only. DO NOT INCLUDE how your pain psychologically affects you. This scale is for adults 44 years of age and older. It has 11 (eleven) levels. The 1st level is 0/10. This means: "right now, I have no pain". In the context of pain management, it also means: "right now, my physical pain is under control with the current therapy".  General Information:  The scale should reflect your current level of pain. Unless you are specifically asked for the level of your worst pain, or your average pain. If you are asked for one of these two, then it should be understood that it is over the past 24 hours.  Levels 1 (one) through 5 (five) are described below, and can be treated as an outpatient. Ambulatory pain management facilities such as ours are more than adequate to treat these levels. Levels 6 (six) through 10 (ten) are also described below, however, these must be treated as a hospitalized  patient. While levels 6 (six) and 7 (seven) may be evaluated at an urgent care facility, levels 8 (eight) through 10 (ten) constitute medical emergencies and as such, they belong in a hospital's emergency department. When having these levels (as described below), do not come to our office. Our facility is not equipped to manage these levels. Go directly to an urgent care facility or an emergency department to be evaluated.  Definitions:  Activities of Daily Living (ADL): Activities of daily living (ADL or ADLs) is a term used in healthcare to refer to people's daily self-care activities. Health professionals often use a person's ability or inability to perform ADLs as a measurement of their functional status, particularly in regard to people post injury, with disabilities and the elderly. There are two ADL levels: Basic and Instrumental. Basic Activities of Daily Living (BADL  or BADLs) consist of self-care tasks that include: Bathing and showering; personal hygiene and grooming (including brushing/combing/styling hair); dressing; Toilet hygiene (getting to the toilet, cleaning oneself, and getting back up); eating and self-feeding (not including cooking or chewing and swallowing); functional mobility, often referred to as "transferring", as measured by the ability to walk, get in and out of bed, and get into and out of a chair; the broader definition (moving from one place to another while performing activities) is useful for people with different physical abilities who are still able to get around independently. Basic ADLs include the things many people do when they get up in the morning and get ready to go out of the house: get out  of bed, go to the toilet, bathe, dress, groom, and eat. On the average, loss of function typically follows a particular order. Hygiene is the first to go, followed by loss of toilet use and locomotion. The last to go is the ability to eat. When there is only one remaining area in  which the person is independent, there is a 62.9% chance that it is eating and only a 3.5% chance that it is hygiene. Instrumental Activities of Daily Living (IADL or IADLs) are not necessary for fundamental functioning, but they let an individual live independently in a community. IADL consist of tasks that include: cleaning and maintaining the house; home establishment and maintenance; care of others (including selecting and supervising caregivers); care of pets; child rearing; managing money; managing financials (investments, etc.); meal preparation and cleanup; shopping for groceries and necessities; moving within the community; safety procedures and emergency responses; health management and maintenance (taking prescribed medications); and using the telephone or other form of communication.  Instructions:  Most patients tend to report their pain as a combination of two factors, their physical pain and their psychosocial pain. This last one is also known as "suffering" and it is reflection of how physical pain affects you socially and psychologically. From now on, report them separately.  From this point on, when asked to report your pain level, report only your physical pain. Use the following table for reference.  Pain Clinic Pain Levels (0-5/10)  Pain Level Score  Description  No Pain 0   Mild pain 1 Nagging, annoying, but does not interfere with basic activities of daily living (ADL). Patients are able to eat, bathe, get dressed, toileting (being able to get on and off the toilet and perform personal hygiene functions), transfer (move in and out of bed or a chair without assistance), and maintain continence (able to control bladder and bowel functions). Blood pressure and heart rate are unaffected. A normal heart rate for a healthy adult ranges from 60 to 100 bpm (beats per minute).   Mild to moderate pain 2 Noticeable and distracting. Impossible to hide from other people. More frequent  flare-ups. Still possible to adapt and function close to normal. It can be very annoying and may have occasional stronger flare-ups. With discipline, patients may get used to it and adapt.   Moderate pain 3 Interferes significantly with activities of daily living (ADL). It becomes difficult to feed, bathe, get dressed, get on and off the toilet or to perform personal hygiene functions. Difficult to get in and out of bed or a chair without assistance. Very distracting. With effort, it can be ignored when deeply involved in activities.   Moderately severe pain 4 Impossible to ignore for more than a few minutes. With effort, patients may still be able to manage work or participate in some social activities. Very difficult to concentrate. Signs of autonomic nervous system discharge are evident: dilated pupils (mydriasis); mild sweating (diaphoresis); sleep interference. Heart rate becomes elevated (>115 bpm). Diastolic blood pressure (lower number) rises above 100 mmHg. Patients find relief in laying down and not moving.   Severe pain 5 Intense and extremely unpleasant. Associated with frowning face and frequent crying. Pain overwhelms the senses.  Ability to do any activity or maintain social relationships becomes significantly limited. Conversation becomes difficult. Pacing back and forth is common, as getting into a comfortable position is nearly impossible. Pain wakes you up from deep sleep. Physical signs will be obvious: pupillary dilation; increased sweating; goosebumps; brisk reflexes; cold,  clammy hands and feet; nausea, vomiting or dry heaves; loss of appetite; significant sleep disturbance with inability to fall asleep or to remain asleep. When persistent, significant weight loss is observed due to the complete loss of appetite and sleep deprivation.  Blood pressure and heart rate becomes significantly elevated. Caution: If elevated blood pressure triggers a pounding headache associated with blurred  vision, then the patient should immediately seek attention at an urgent or emergency care unit, as these may be signs of an impending stroke.    Emergency Department Pain Levels (6-10/10)  Emergency Room Pain 6 Severely limiting. Requires emergency care and should not be seen or managed at an outpatient pain management facility. Communication becomes difficult and requires great effort. Assistance to reach the emergency department may be required. Facial flushing and profuse sweating along with potentially dangerous increases in heart rate and blood pressure will be evident.   Distressing pain 7 Self-care is very difficult. Assistance is required to transport, or use restroom. Assistance to reach the emergency department will be required. Tasks requiring coordination, such as bathing and getting dressed become very difficult.   Disabling pain 8 Self-care is no longer possible. At this level, pain is disabling. The individual is unable to do even the most "basic" activities such as walking, eating, bathing, dressing, transferring to a bed, or toileting. Fine motor skills are lost. It is difficult to think clearly.   Incapacitating pain 9 Pain becomes incapacitating. Thought processing is no longer possible. Difficult to remember your own name. Control of movement and coordination are lost.   The worst pain imaginable 10 At this level, most patients pass out from pain. When this level is reached, collapse of the autonomic nervous system occurs, leading to a sudden drop in blood pressure and heart rate. This in turn results in a temporary and dramatic drop in blood flow to the brain, leading to a loss of consciousness. Fainting is one of the body's self defense mechanisms. Passing out puts the brain in a calmed state and causes it to shut down for a while, in order to begin the healing process.    Summary: 1. Refer to this scale when providing [place] with your pain level. 2. Be accurate and careful when  reporting your pain level. This will help with your care. 3. Over-reporting your pain level will lead to loss of credibility. 4. Even a level of 1/10 means that there is pain and will be treated at our facility. 5. High, inaccurate reporting will be documented as "Symptom Exaggeration", leading to loss of credibility and suspicions of possible secondary gains such as obtaining more narcotics, or wanting to appear disabled, for fraudulent reasons. 6. Only pain levels of 5 or below will be seen at our facility. 7. Pain levels of 6 and above will be sent to the Emergency Department and the appointment cancelled. ______________________________________________________________________________________________

## 2018-07-30 ENCOUNTER — Other Ambulatory Visit: Payer: Self-pay | Admitting: Family Medicine

## 2018-07-30 ENCOUNTER — Emergency Department: Payer: Medicare Other

## 2018-07-30 ENCOUNTER — Emergency Department
Admission: EM | Admit: 2018-07-30 | Discharge: 2018-07-30 | Disposition: A | Discharge status: Home / Self Care | Payer: Medicare Other | Attending: Emergency Medicine | Admitting: Emergency Medicine

## 2018-07-30 ENCOUNTER — Other Ambulatory Visit: Payer: Self-pay

## 2018-07-30 ENCOUNTER — Encounter: Payer: Self-pay | Admitting: Emergency Medicine

## 2018-07-30 DIAGNOSIS — R05 Cough: Secondary | ICD-10-CM | POA: Diagnosis present

## 2018-07-30 DIAGNOSIS — Z87891 Personal history of nicotine dependence: Secondary | ICD-10-CM | POA: Insufficient documentation

## 2018-07-30 DIAGNOSIS — Z7982 Long term (current) use of aspirin: Secondary | ICD-10-CM | POA: Diagnosis not present

## 2018-07-30 DIAGNOSIS — I10 Essential (primary) hypertension: Secondary | ICD-10-CM | POA: Diagnosis not present

## 2018-07-30 DIAGNOSIS — J449 Chronic obstructive pulmonary disease, unspecified: Secondary | ICD-10-CM | POA: Diagnosis not present

## 2018-07-30 DIAGNOSIS — J069 Acute upper respiratory infection, unspecified: Secondary | ICD-10-CM

## 2018-07-30 DIAGNOSIS — Z9049 Acquired absence of other specified parts of digestive tract: Secondary | ICD-10-CM | POA: Diagnosis not present

## 2018-07-30 DIAGNOSIS — F329 Major depressive disorder, single episode, unspecified: Secondary | ICD-10-CM | POA: Insufficient documentation

## 2018-07-30 DIAGNOSIS — F419 Anxiety disorder, unspecified: Secondary | ICD-10-CM | POA: Insufficient documentation

## 2018-07-30 DIAGNOSIS — Z85828 Personal history of other malignant neoplasm of skin: Secondary | ICD-10-CM | POA: Insufficient documentation

## 2018-07-30 DIAGNOSIS — F332 Major depressive disorder, recurrent severe without psychotic features: Secondary | ICD-10-CM

## 2018-07-30 DIAGNOSIS — Z79899 Other long term (current) drug therapy: Secondary | ICD-10-CM | POA: Diagnosis not present

## 2018-07-30 LAB — BASIC METABOLIC PANEL
Anion gap: 6 (ref 5–15)
BUN: 33 mg/dL — AB (ref 8–23)
CO2: 25 mmol/L (ref 22–32)
Calcium: 9.4 mg/dL (ref 8.9–10.3)
Chloride: 111 mmol/L (ref 98–111)
Creatinine, Ser: 0.9 mg/dL (ref 0.61–1.24)
GFR calc Af Amer: >60 mL/min (ref >60)
GFR calc non Af Amer: >60 mL/min (ref >60)
GLUCOSE: 143 mg/dL — AB (ref 70–99)
Potassium: 4.6 mmol/L (ref 3.5–5.1)
Sodium: 142 mmol/L (ref 135–145)

## 2018-07-30 LAB — CBC
HCT: 46 % (ref 39.0–52.0)
Hemoglobin: 14.4 g/dL (ref 13.0–17.0)
MCH: 27.9 pg (ref 26.0–34.0)
MCHC: 31.3 g/dL (ref 30.0–36.0)
MCV: 89.1 fL (ref 80.0–100.0)
Platelets: 224 10*3/uL (ref 150–400)
RBC: 5.16 MIL/uL (ref 4.22–5.81)
RDW: 14.6 % (ref 11.5–15.5)
WBC: 6.7 10*3/uL (ref 4.0–10.5)
nRBC: 0 % (ref 0.0–0.2)

## 2018-07-30 LAB — INFLUENZA PANEL BY PCR (TYPE A & B)
Influenza A By PCR: NEGATIVE
Influenza B By PCR: NEGATIVE

## 2018-07-30 LAB — TROPONIN I: Troponin I: <0.03 ng/mL (ref <0.03)

## 2018-07-30 MED ORDER — DOXYCYCLINE HYCLATE 100 MG PO CAPS: 100.0000 mg | ORAL_CAPSULE | Freq: Two times a day (BID) | ORAL | 0 refills | Status: DC

## 2018-07-30 MED ORDER — DOXYCYCLINE HYCLATE 100 MG PO TABS
100.0000 mg | ORAL_TABLET | Freq: Once | ORAL | Status: AC
  Administered 2018-07-30: 100 mg via ORAL
  Filled 2018-07-30: qty 1

## 2018-07-30 NOTE — ED Triage Notes (Signed)
PT c/o flu like symptoms xfew days with cough and intermit fevers, body aches and fatigue. PT A&OX4 , VSS

## 2018-07-30 NOTE — Care Management Note (Signed)
Case Management Note  Patient Details  Name: Aaron Gibbs MRN: [id number] Date of Birth: [birth year]  Subjective/Objective:   Patient is being seen in the ED today for generalized weakness and flu like symptoms.  Patient has tested negative for the flu and there is no medical reason for him to be admitted.   ED MD will discharge patient home.  RNCM spoke with patient about discharge plans and resumption of home health care.  [facility] is open to [name] aware of ED visit.  Patient states "aren't they going to give me some medicine" " medicine to make me feel better there must be something they can give me to help me feel better."  RNCM reported to patient of uncertainty of any prescriptions he may not need any medications, patient then states "if they don't give me any medicine I have wasted my whole day here".   RNCM relayed this information to the patient's nurse.  RNCM instructed patient that [facility] will be notified of discharge home.  RNCM signed off. Doran Clay RN BSN [id number]                   Action/Plan:   Expected Discharge Date:                  Expected Discharge Plan:  [place]  In-House Referral:     Discharge planning Services  CM Consult  Post Acute Care Choice:    Choice offered to:     DME Arranged:    DME Agency:     HH Arranged:    [place]:  [name])(open with [name] will resume services)  Status of Service:  Completed, signed off  If discussed at [employer] of Stay Meetings, dates discussed:    Additional Comments:  Shelbie Hutching, RN 07/30/2018, 4:50 PM

## 2018-07-30 NOTE — ED Provider Notes (Signed)
[facility] Emergency Department Provider Note  ____________________________________________   I have reviewed the triage vital signs and the nursing notes. Where available I have reviewed prior notes and, if possible and indicated, outside hospital notes.    HISTORY  Chief Complaint No chief complaint on file.    HPI Aaron Gibbs is a 84 y.o. male  Who is here today complaining of cough.  Rhinorrhea, not feeling well generally speaking.  He has had no chest pain, no shortness of breath, no abdominal pain.  He is eating and drinking.  He does not have a productive cough.  He does feel somewhat weak. Here with a caretaker.  He is at his otherwise baseline.   Past Medical History:  Diagnosis Date  . Anxiety   . Cancer ([place])    skin  . COPD (chronic obstructive pulmonary disease) ([place])   . Glaucoma   . Osteoporosis    osteopenia    Patient Active Problem List   Diagnosis Date Noted  . Atherosclerotic peripheral vascular disease ([place]) 07/24/2018  . Tricompartment osteoarthritis of knee (Left) 07/24/2018  . Osteoarthritis of knee (Bilateral) 07/24/2018  . Osteoarthritis of patellofemoral joint (Right) 07/24/2018  . History of suicide attempt (06/12/18) 07/24/2018  . Long term current use of opiate analgesic 07/10/2018  . Pharmacologic therapy 07/10/2018  . Disorder of skeletal system 07/10/2018  . Problems influencing health status 07/10/2018  . Suicide attempt ([place]) (05/13/18) 06/26/2018  . Syncope and collapse 06/04/2018  . 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 Recurrent major depression-severe ([place]) 06/04/2018  . Blindness 05/10/2018  . Suicidal ideation 05/10/2018  . Chronic pain syndrome 05/01/2018  . DISH (diffuse idiopathic skeletal hyperostosis) 05/01/2018  . Osteoarthritis of multiple joints 05/01/2018  . Chronic knee pain (Primary Area of Pain) (Bilateral) (L>R) 05/01/2018  . Retinitis pigmentosa of both eyes 05/01/2018  .  Glaucoma of both eyes 05/01/2018  . Essential hypertension 05/01/2018  . Chronic low back pain (Bilateral) w/o sciatica 05/01/2018  . GERD (gastroesophageal reflux disease) 05/01/2018  . 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 Therapeutic opioid-induced constipation (OIC) 05/01/2018    Past Surgical History:  Procedure Laterality Date  . CHOLECYSTECTOMY    . TRANSURETHRAL RESECTION OF PROSTATE N/A 2008    Prior to Admission medications   Medication Sig Start Date End Date Taking? Authorizing Provider  amLODipine (NORVASC) 10 MG tablet Take 10 mg by mouth daily.    [provider]  aspirin EC 81 MG tablet Take 81 mg by mouth daily.    [provider]  DULoxetine (CYMBALTA) 30 MG capsule Take 1 capsule (30 mg total) by mouth daily. 07/12/18   Karamalegos, Devonne Doughty, DO  fenofibrate (TRICOR) 145 MG tablet Take 145 mg by mouth daily.    [provider]  ferrous sulfate 325 (65 FE) MG tablet Take 325 mg by mouth daily with breakfast.    [provider]  meloxicam (MOBIC) 7.5 MG tablet Take 7.5 mg by mouth 2 (two) times daily.    [provider]  Multiple Vitamin (MULTIVITAMIN WITH MINERALS) TABS tablet Take 1 tablet by mouth daily.    [provider]  oxyCODONE-acetaminophen (PERCOCET) 10-325 MG tablet Take 1 tablet by mouth 3 (three) times daily. 07/12/18   Karamalegos, Devonne Doughty, DO  pantoprazole (PROTONIX) 40 MG tablet Take 40 mg by mouth 2 (two) times daily.    [provider]  polyethylene glycol (MIRALAX / GLYCOLAX) packet Take 17 g by mouth daily as needed  for mild constipation.    [provider]  prednisoLONE acetate (PRED FORTE) 1 % ophthalmic suspension Place 1 drop into the left eye 2 (two) times daily.    [provider]  pregabalin (LYRICA) 100 MG capsule Take 1 capsule (100 mg total) by mouth 4 (four) times daily. 07/12/18   Karamalegos, Devonne Doughty, DO  QUEtiapine (SEROQUEL) 25 MG  tablet TAKE 1 TABLET BY MOUTH AT BEDTIME 07/30/18   Karamalegos, Devonne Doughty, DO  senna (SENOKOT) 8.6 MG tablet Take 1 tablet by mouth daily.    [provider]  timolol (BETIMOL) 0.25 % ophthalmic solution 1-2 drops 2 (two) times daily.    [provider]  umeclidinium-vilanterol (ANORO ELLIPTA) 62.5-25 MCG/INH AEPB Inhale 1 puff into the lungs daily.    [provider]    Allergies Azithromycin  Family History  Problem Relation Age of Onset  . Depression Mother   . Heart disease Father     Social History Social History   Tobacco Use  . Smoking status: Former Smoker    Years: 15.00  . Smokeless tobacco: Current User    Types: Chew  Substance Use Topics  . Alcohol use: Not Currently    Comment: past  . Drug use: Never    Review of Systems Constitutional: No fever/chills Eyes: No visual changes. ENT: No sore throat. No stiff neck no neck pain Cardiovascular: Denies chest pain. Respiratory: Denies shortness of breath.  No cough Gastrointestinal:   no vomiting.  No diarrhea.  No constipation. Genitourinary: Negative for dysuria. Musculoskeletal: Negative lower extremity swelling Skin: Negative for rash. Neurological: Negative for severe headaches, focal weakness or numbness.   ____________________________________________   PHYSICAL EXAM:  VITAL SIGNS: ED Triage Vitals  Enc Vitals Group     BP 07/30/18 1400 (!) 150/82     Pulse Rate 07/30/18 1159 72     Resp 07/30/18 1159 16     Temp 07/30/18 1159 (!) 97.5 F (36.4 C)     Temp Source 07/30/18 1159 Oral     SpO2 07/30/18 1159 96 %     Weight --      Height --      Head Circumference --      Peak Flow --      Pain Score 07/30/18 1200 8     Pain Loc --      Pain Edu? --      Excl. in [place]? --     Constitutional: The gentleman looks a little bit anxious but in no acute distress, Eyes: Conjunctivae are normal Head: Atraumatic HEENT: + congestion/rhinnorhea. Mucous membranes are  moist.  Oropharynx non-erythematous Neck:   Nontender with no meningismus, no masses, no stridor Cardiovascular: Normal rate, regular rhythm. Grossly normal heart sounds.  Good peripheral circulation. Respiratory: Normal respiratory effort.  No retractions. Lungs CTAB. Abdominal: Soft and nontender. No distention. No guarding no rebound Back:  There is no focal tenderness or step off.  there is no midline tenderness there are no lesions noted. there is no CVA tenderness  Musculoskeletal: No lower extremity tenderness, no upper extremity tenderness. No joint effusions, no DVT signs strong distal pulses no edema Neurologic:  Normal speech and language. No gross focal neurologic deficits are appreciated.  Skin:  Skin is warm, dry and intact. No rash noted. Psychiatric: Mood and affect are normal. Speech and behavior are normal.  ____________________________________________   LABS (all labs ordered are listed, but only abnormal results are displayed)  Labs Reviewed  BASIC METABOLIC PANEL - Abnormal; Notable for the following components:      Result Value   Glucose, Bld 143 (*)    BUN 33 (*)    All other components within normal limits  CBC  TROPONIN I  INFLUENZA PANEL BY PCR (TYPE A & B)  URINALYSIS, COMPLETE (UACMP) WITH MICROSCOPIC    Pertinent labs  results that were available during my care of the patient were reviewed by me and considered in my medical decision making (see chart for details). ____________________________________________  EKG  I personally interpreted any EKGs ordered by me or triage  ____________________________________________  RADIOLOGY  Pertinent labs & imaging results that were available during my care of the patient were reviewed by me and considered in my medical decision making (see chart for details). If possible, patient and/or family made aware of any abnormal findings.  No results found. ____________________________________________     PROCEDURES  Procedure(s) performed: None  Procedures  Critical Care performed: None  ____________________________________________   INITIAL IMPRESSION / ASSESSMENT AND PLAN / ED COURSE  Pertinent labs & imaging results that were available during my care of the patient were reviewed by me and considered in my medical decision making (see chart for details).  Patient here after a few days worth of URI symptoms.  We will obtain chest x-ray vital signs are reassuring, cardiac enzymes are negative, patient is nontoxic in appearance.  I have reviewed multiple recent prior notes for this patient.    ____________________________________________   FINAL CLINICAL IMPRESSION(S) / ED DIAGNOSES  Final diagnoses:  None      This chart was dictated using voice recognition software.  Despite best efforts to proofread,  errors can occur which can change meaning.      Schuyler Amor, MD 07/30/18 1451

## 2018-08-01 ENCOUNTER — Ambulatory Visit: Payer: Medicare Other | Attending: Nurse Practitioner

## 2018-08-01 ENCOUNTER — Ambulatory Visit: Payer: Medicare Other | Admitting: Pain Medicine

## 2018-08-01 ENCOUNTER — Ambulatory Visit: Payer: Self-pay | Admitting: Family Medicine

## 2018-08-01 DIAGNOSIS — M25561 Pain in right knee: Secondary | ICD-10-CM | POA: Insufficient documentation

## 2018-08-01 DIAGNOSIS — M25562 Pain in left knee: Secondary | ICD-10-CM | POA: Insufficient documentation

## 2018-08-01 DIAGNOSIS — G8929 Other chronic pain: Secondary | ICD-10-CM | POA: Insufficient documentation

## 2018-08-01 NOTE — Therapy (Signed)
[facility] [address], [place], [address] Phone: [phone]   Fax:  [phone]  Physical Therapy Evaluation  Patient Details  Name: Aaron Gibbs MRN: [id number] Date of Birth: 1934-10-26 No data recorded  Encounter Date: 08/01/2018    Past Medical History:  Diagnosis Date  . Anxiety   . Cancer ([place])    skin  . COPD (chronic obstructive pulmonary disease) ([place])   . Glaucoma   . Osteoporosis    osteopenia    Past Surgical History:  Procedure Laterality Date  . CHOLECYSTECTOMY    . TRANSURETHRAL RESECTION OF PROSTATE N/A 2008    There were no vitals filed for this visit.                  Objective measurements completed on examination: See above findings.   Bilateral chronic knee pain Disorder of skeletal system Problems influencing health status  Dionisio David, NP  07/10/2018                      Patient will benefit from skilled therapeutic intervention in order to improve the following deficits and impairments:     Visit Diagnosis: No diagnosis found.     Problem List Patient Active Problem List   Diagnosis Date Noted  . Atherosclerotic peripheral vascular disease ([place]) 07/24/2018  . Tricompartment osteoarthritis of knee (Left) 07/24/2018  . Osteoarthritis of knee (Bilateral) 07/24/2018  . Osteoarthritis of patellofemoral joint (Right) 07/24/2018  . History of suicide attempt (06/12/18) 07/24/2018  . Long term current use of opiate analgesic 07/10/2018  . Pharmacologic therapy 07/10/2018  . Disorder of skeletal system 07/10/2018  . Problems influencing health status 07/10/2018  . Suicide attempt ([place]) (05/13/18) 06/26/2018  . Syncope and collapse 06/04/2018  . 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 Recurrent major depression-severe ([place]) 06/04/2018  . Blindness 05/10/2018  . Suicidal ideation 05/10/2018  . Chronic pain syndrome  05/01/2018  . DISH (diffuse idiopathic skeletal hyperostosis) 05/01/2018  . Osteoarthritis of multiple joints 05/01/2018  . Chronic knee pain (Primary Area of Pain) (Bilateral) (L>R) 05/01/2018  . Retinitis pigmentosa of both eyes 05/01/2018  . Glaucoma of both eyes 05/01/2018  . Essential hypertension 05/01/2018  . Chronic low back pain (Bilateral) w/o sciatica 05/01/2018  . GERD (gastroesophageal reflux disease) 05/01/2018  . 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 Therapeutic opioid-induced constipation (OIC) 05/01/2018    Nancy Manuele 08/01/2018, 1:24 PM  [facility] [address] [place], [address] Phone: [phone]   Fax:  [phone]  Name: Aaron Gibbs MRN: [id number] Date of Birth: March 18, 1935

## 2018-08-05 ENCOUNTER — Ambulatory Visit
Admission: RE | Admit: 2018-08-05 | Discharge: 2018-08-05 | Disposition: A | Discharge status: Home / Self Care | Payer: Medicare Other | Source: Ambulatory Visit | Attending: Pain Medicine | Admitting: Pain Medicine

## 2018-08-05 DIAGNOSIS — M25561 Pain in right knee: Secondary | ICD-10-CM | POA: Diagnosis present

## 2018-08-05 DIAGNOSIS — M170 Bilateral primary osteoarthritis of knee: Secondary | ICD-10-CM | POA: Insufficient documentation

## 2018-08-05 DIAGNOSIS — M25562 Pain in left knee: Principal | ICD-10-CM

## 2018-08-05 DIAGNOSIS — G8929 Other chronic pain: Secondary | ICD-10-CM

## 2018-08-05 DIAGNOSIS — M1711 Unilateral primary osteoarthritis, right knee: Secondary | ICD-10-CM | POA: Insufficient documentation

## 2018-08-05 DIAGNOSIS — M1712 Unilateral primary osteoarthritis, left knee: Secondary | ICD-10-CM | POA: Diagnosis present

## 2018-08-06 ENCOUNTER — Encounter: Payer: Self-pay | Admitting: Pain Medicine

## 2018-08-06 ENCOUNTER — Other Ambulatory Visit: Payer: Self-pay

## 2018-08-06 ENCOUNTER — Ambulatory Visit: Payer: Medicare Other

## 2018-08-06 ENCOUNTER — Ambulatory Visit: Payer: Medicare Other | Attending: Pain Medicine | Admitting: Pain Medicine

## 2018-08-06 VITALS — BP 143/70 | HR 95 | Temp 97.7°F | Resp 16 | Ht 70.0 in | Wt 220.0 lb

## 2018-08-06 DIAGNOSIS — M1711 Unilateral primary osteoarthritis, right knee: Secondary | ICD-10-CM | POA: Insufficient documentation

## 2018-08-06 DIAGNOSIS — M25562 Pain in left knee: Secondary | ICD-10-CM | POA: Diagnosis present

## 2018-08-06 DIAGNOSIS — M170 Bilateral primary osteoarthritis of knee: Secondary | ICD-10-CM | POA: Diagnosis not present

## 2018-08-06 DIAGNOSIS — M25561 Pain in right knee: Secondary | ICD-10-CM | POA: Insufficient documentation

## 2018-08-06 DIAGNOSIS — M1712 Unilateral primary osteoarthritis, left knee: Secondary | ICD-10-CM | POA: Insufficient documentation

## 2018-08-06 DIAGNOSIS — G8929 Other chronic pain: Secondary | ICD-10-CM | POA: Insufficient documentation

## 2018-08-06 MED ORDER — SODIUM HYALURONATE (VISCOSUP) 20 MG/2ML IX SOSY
2.0000 mL | PREFILLED_SYRINGE | Freq: Once | INTRA_ARTICULAR | Status: AC
  Administered 2018-08-06: 11:00:00 via INTRA_ARTICULAR

## 2018-08-06 MED ORDER — ROPIVACAINE HCL 2 MG/ML IJ SOLN
5.0000 mL | Freq: Once | INTRAMUSCULAR | Status: AC
  Administered 2018-08-06: 10 mL via INTRA_ARTICULAR

## 2018-08-06 MED ORDER — ROPIVACAINE HCL 2 MG/ML IJ SOLN
INTRAMUSCULAR | Status: AC
  Filled 2018-08-06: qty 10

## 2018-08-06 MED ORDER — LIDOCAINE HCL (PF) 1 % IJ SOLN: 5.0000 mL | Freq: Once | INTRAMUSCULAR | Status: DC

## 2018-08-06 MED ORDER — LIDOCAINE HCL 2 % IJ SOLN
INTRAMUSCULAR | Status: AC
  Filled 2018-08-06: qty 20

## 2018-08-06 NOTE — Progress Notes (Signed)
Safety precautions to be maintained throughout the outpatient stay will include: orient to surroundings, keep bed in low position, maintain call bell within reach at all times, provide assistance with transfer out of bed and ambulation.  

## 2018-08-06 NOTE — Progress Notes (Signed)
Patient's Name: Aaron Gibbs  MRN: [id number]  Referring Provider: [name] *  DOB: 30-Nov-1934  PCP: Olin Hauser, DO  DOS: 08/06/2018  Note by: Gaspar Cola, MD  Service setting: Ambulatory outpatient  Specialty: Interventional Pain Management  Patient type: Established  Location: [facility] (AMB) Pain Management Facility  Visit type: Interventional Procedure   Primary Reason for Visit: Interventional Pain Management Treatment. CC: Knee Pain (bilateral)  Procedure:          Anesthesia, Analgesia, Anxiolysis:  Type: Therapeutic Intra-Articular Hyalgan Knee Injection #1  Region: Lateral infrapatellar Knee Region Level: Knee Joint Laterality: Bilateral  Type: Local Anesthesia Indication(s): Analgesia         Local Anesthetic: Lidocaine 1-2% Route: Infiltration ([place]/IM) IV Access: Declined Sedation: Declined   Position: Sitting   Indications: 1. Osteoarthritis of knee (Bilateral)   2. Chronic knee pain (Primary Area of Pain) (Bilateral) (L>R)   3. Tricompartment osteoarthritis of knee (Left)   4. Osteoarthritis of patellofemoral joint (Right)    Pain Score: Pre-procedure: 7 /10 Post-procedure: 7 /10  Pre-op Assessment:  Aaron Gibbs is a 84 y.o. (year old), male patient, seen today for interventional treatment. He  has a past surgical history that includes Cholecystectomy and Transurethral resection of prostate (N/A, 2008). Aaron Gibbs has a current medication list which includes the following prescription(s): amlodipine, aspirin ec, doxycycline, duloxetine, fenofibrate, ferrous sulfate, meloxicam, multivitamin with minerals, oxycodone-acetaminophen, pantoprazole, polyethylene glycol, prednisolone acetate, pregabalin, quetiapine, senna, timolol, and umeclidinium-vilanterol, and the following Facility-Administered Medications: lidocaine (pf). His primarily concern today is the Knee Pain (bilateral)  Initial Vital Signs:  Pulse/HCG Rate: 95  Temp: 97.7 F  (36.5 C) Resp: 16 BP: (!) 143/70 SpO2: 97 %  BMI: Estimated body mass index is 31.57 kg/m as calculated from the following:   Height as of this encounter: 5\' 10"  (1.778 m).   Weight as of this encounter: 220 lb (99.8 kg).  Risk Assessment: Allergies: Reviewed. He is allergic to azithromycin.  Allergy Precautions: None required Coagulopathies: Reviewed. None identified.  Blood-thinner therapy: None at this time Active Infection(s): Reviewed. None identified. Aaron Gibbs is afebrile  Site Confirmation: Aaron Gibbs was asked to confirm the procedure and laterality before marking the site Procedure checklist: Completed Consent: Before the procedure and under the influence of no sedative(s), amnesic(s), or anxiolytics, the patient was informed of the treatment options, risks and possible complications. To fulfill our ethical and legal obligations, as recommended by the American Medical Association's Code of Ethics, I have informed the patient of my clinical impression; the nature and purpose of the treatment or procedure; the risks, benefits, and possible complications of the intervention; the alternatives, including doing nothing; the risk(s) and benefit(s) of the alternative treatment(s) or procedure(s); and the risk(s) and benefit(s) of doing nothing. The patient was provided information about the general risks and possible complications associated with the procedure. These may include, but are not limited to: failure to achieve desired goals, infection, bleeding, organ or nerve damage, allergic reactions, paralysis, and death. In addition, the patient was informed of those risks and complications associated to the procedure, such as failure to decrease pain; infection; bleeding; organ or nerve damage with subsequent damage to sensory, motor, and/or autonomic systems, resulting in permanent pain, numbness, and/or weakness of one or several areas of the body; allergic reactions; (i.e.:  anaphylactic reaction); and/or death. Furthermore, the patient was informed of those risks and complications associated with the medications. These include, but are not limited to: allergic reactions (i.e.:  anaphylactic or anaphylactoid reaction(s)); adrenal axis suppression; blood sugar elevation that in diabetics may result in ketoacidosis or comma; water retention that in patients with history of congestive heart failure may result in shortness of breath, pulmonary edema, and decompensation with resultant heart failure; weight gain; swelling or edema; medication-induced neural toxicity; particulate matter embolism and blood vessel occlusion with resultant organ, and/or nervous system infarction; and/or aseptic necrosis of one or more joints. Finally, the patient was informed that Medicine is not an exact science; therefore, there is also the possibility of unforeseen or unpredictable risks and/or possible complications that may result in a catastrophic outcome. The patient indicated having understood very clearly. We have given the patient no guarantees and we have made no promises. Enough time was given to the patient to ask questions, all of which were answered to the patient's satisfaction. Aaron Gibbs has indicated that he wanted to continue with the procedure. Attestation: I, the ordering provider, attest that I have discussed with the patient the benefits, risks, side-effects, alternatives, likelihood of achieving goals, and potential problems during recovery for the procedure that I have provided informed consent. Date  Time: 08/06/2018 10:38 AM  Pre-Procedure Preparation:  Monitoring: As per clinic protocol. Respiration, ETCO2, SpO2, BP, heart rate and rhythm monitor placed and checked for adequate function Safety Precautions: Patient was assessed for positional comfort and pressure points before starting the procedure. Time-out: I initiated and conducted the "Time-out" before starting the  procedure, as per protocol. The patient was asked to participate by confirming the accuracy of the "Time Out" information. Verification of the correct person, site, and procedure were performed and confirmed by me, the nursing staff, and the patient. "Time-out" conducted as per Joint Commission's Universal Protocol (UP.01.01.01). Time: 1106  Description of Procedure:          Target Area: Knee Joint Approach: Just above the Lateral tibial plateau, lateral to the infrapatellar tendon. Area Prepped: Entire knee area, from the mid-thigh to the mid-shin. Prepping solution: ChloraPrep (2% chlorhexidine gluconate and 70% isopropyl alcohol) Safety Precautions: Aspiration looking for blood return was conducted prior to all injections. At no point did we inject any substances, as a needle was being advanced. No attempts were made at seeking any paresthesias. Safe injection practices and needle disposal techniques used. Medications properly checked for expiration dates. SDV (single dose vial) medications used. Description of the Procedure: Protocol guidelines were followed. The patient was placed in position over the fluoroscopy table. The target area was identified and the area prepped in the usual manner. Skin & deeper tissues infiltrated with local anesthetic. Appropriate amount of time allowed to pass for local anesthetics to take effect. The procedure needles were then advanced to the target area. Proper needle placement secured. Negative aspiration confirmed. Solution injected in intermittent fashion, asking for systemic symptoms every 0.5cc of injectate. The needles were then removed and the area cleansed, making sure to leave some of the prepping solution back to take advantage of its long term bactericidal properties. Vitals:   08/06/18 1037  BP: (!) 143/70  Pulse: 95  Resp: 16  Temp: 97.7 F (36.5 C)  TempSrc: Oral  SpO2: 97%  Weight: 220 lb (99.8 kg)  Height: 5\' 10"  (1.778 m)    Start Time:  1106 hrs. End Time: 1108 hrs. Materials:  Needle(s) Type: Regular needle Gauge: 25G Length: 1.5-in Medication(s): Please see orders for medications and dosing details.  Imaging Guidance:          Type of Imaging  Technique: None used Indication(s): N/A Exposure Time: No patient exposure Contrast: None used. Fluoroscopic Guidance: N/A Ultrasound Guidance: N/A Interpretation: N/A  Antibiotic Prophylaxis:   Anti-infectives (From admission, onward)   None     Indication(s): None identified  Post-operative Assessment:  Post-procedure Vital Signs:  Pulse/HCG Rate: 95  Temp: 97.7 F (36.5 C) Resp: 16 BP: (!) 143/70 SpO2: 97 %  EBL: None  Complications: No immediate post-treatment complications observed by team, or reported by patient.  Note: The patient tolerated the entire procedure well. A repeat set of vitals were taken after the procedure and the patient was kept under observation following institutional policy, for this type of procedure. Post-procedural neurological assessment was performed, showing return to baseline, prior to discharge. The patient was provided with post-procedure discharge instructions, including a section on how to identify potential problems. Should any problems arise concerning this procedure, the patient was given instructions to immediately contact us, at any time, without hesitation. In any case, we plan to contact the patient by telephone for a follow-up status report regarding this interventional procedure.  Comments:  No additional relevant information.  Plan of Care   Imaging Orders  No imaging studies ordered today    Procedure Orders     KNEE INJECTION     KNEE INJECTION  Medications ordered for procedure: Meds ordered this encounter  Medications  . lidocaine (PF) (XYLOCAINE) 1 % injection 5 mL  . ropivacaine (PF) 2 mg/mL (0.2%) (NAROPIN) injection 5 mL  . Sodium Hyaluronate SOSY 2 mL  . Sodium Hyaluronate SOSY 2 mL    Medications administered: We administered ropivacaine (PF) 2 mg/mL (0.2%), Sodium Hyaluronate, and Sodium Hyaluronate.  See the medical record for exact dosing, route, and time of administration.  Disposition: Discharge home  Discharge Date & Time: 08/06/2018; 1110 hrs.   Physician-requested Follow-up: Return for Procedure (no sedation) (2 wks): (B) Hyalgan #2.  Future Appointments  Date Time Provider [place]  08/20/2018 10:15 AM Milinda Pointer, MD [facility] None   Primary Care Physician: Olin Hauser, DO Location: [facility] Outpatient Pain Management Facility Note by: Gaspar Cola, MD Date: 08/06/2018; Time: 12:40 PM  Disclaimer:  Medicine is not an Chief Strategy Officer. The only guarantee in medicine is that nothing is guaranteed. It is important to note that the decision to proceed with this intervention was based on the information collected from the patient. The Data and conclusions were drawn from the patient's questionnaire, the interview, and the physical examination. Because the information was provided in large part by the patient, it cannot be guaranteed that it has not been purposely or unconsciously manipulated. Every effort has been made to obtain as much relevant data as possible for this evaluation. It is important to note that the conclusions that lead to this procedure are derived in large part from the available data. Always take into account that the treatment will also be dependent on availability of resources and existing treatment guidelines, considered by other Pain Management Practitioners as being common knowledge and practice, at the time of the intervention. For Medico-Legal purposes, it is also important to point out that variation in procedural techniques and pharmacological choices are the acceptable norm. The indications, contraindications, technique, and results of the above procedure should only be interpreted and judged by a Board-Certified  Interventional Pain Specialist with extensive familiarity and expertise in the same exact procedure and technique.

## 2018-08-06 NOTE — Patient Instructions (Addendum)
____________________________________________________________________________________________  Post-Procedure Discharge Instructions  Instructions:  Apply ice: Fill a plastic sandwich bag with crushed ice. Cover it with a small towel and apply to injection site. Apply for 15 minutes then remove x 15 minutes. Repeat sequence on day of procedure, until you go to bed. The purpose is to minimize swelling and discomfort after procedure.  Apply heat: Apply heat to procedure site starting the day following the procedure. The purpose is to treat any soreness and discomfort from the procedure.  Food intake: Start with clear liquids (like water) and advance to regular food, as tolerated.   Physical activities: Keep activities to a minimum for the first 8 hours after the procedure.   Driving: If you have received any sedation, you are not allowed to drive for 24 hours after your procedure.  Blood thinner: Restart your blood thinner 6 hours after your procedure. (Only for those taking blood thinners)  Insulin: As soon as you can eat, you may resume your normal dosing schedule. (Only for those taking insulin)  Infection prevention: Keep procedure site clean and dry.  Post-procedure Pain Diary: Extremely important that this be done correctly and accurately. Recorded information will be used to determine the next step in treatment.  Pain evaluated is that of treated area only. Do not include pain from an untreated area.  Complete every hour, on the hour, for the initial 8 hours. Set an alarm to help you do this part accurately.  Do not go to sleep and have it completed later. It will not be accurate.  Follow-up appointment: Keep your follow-up appointment after the procedure. Usually 2 weeks for most procedures. (6 weeks in the case of radiofrequency.) Bring you pain diary.   Expect:  From numbing medicine (AKA: Local Anesthetics): Numbness or decrease in pain.  Onset: Full effect within 15  minutes of injected.  Duration: It will depend on the type of local anesthetic used. On the average, 1 to 8 hours.   From steroids: Decrease in swelling or inflammation. Once inflammation is improved, relief of the pain will follow.  Onset of benefits: Depends on the amount of swelling present. The more swelling, the longer it will take for the benefits to be seen. In some cases, up to 10 days.  Duration: Steroids will stay in the system x 2 weeks. Duration of benefits will depend on multiple posibilities including persistent irritating factors.  Occasional side-effects: Facial flushing (red, warm cheeks) , cramps (if present, drink Gatorade and take over-the-counter Magnesium 450-500 mg once to twice a day).  From procedure: Some discomfort is to be expected once the numbing medicine wears off. This should be minimal if ice and heat are applied as instructed.  Call if:  You experience numbness and weakness that gets worse with time, as opposed to wearing off.  New onset bowel or bladder incontinence. (This applies to Spinal procedures only)  Emergency Numbers:  Durning business hours (Monday - Thursday, 8:00 AM - 4:00 PM) (Friday, 9:00 AM - 12:00 Noon): [phone]  After hours: [phone] ____________________________________________________________________________________________   ____________________________________________________________________________________________  Preparing for your procedure (without sedation)  Instructions: . Oral Intake: Do not eat or drink anything for at least 3 hours prior to your procedure. . Transportation: Unless otherwise stated by your physician, you may drive yourself after the procedure. . Blood Pressure Medicine: Take your blood pressure medicine with a sip of water the morning of the procedure. . Blood thinners: Notify our staff if you are taking any blood  thinners. Depending on which one you take, there will be specific  instructions on how and when to stop it. . Diabetics on insulin: Notify the staff so that you can be scheduled 1st case in the morning. If your diabetes requires high dose insulin, take only  of your normal insulin dose the morning of the procedure and notify the staff that you have done so. . Preventing infections: Shower with an antibacterial soap the morning of your procedure.  . Build-up your immune system: Take 1000 mg of Vitamin C with every meal (3 times a day) the day prior to your procedure. Marland Kitchen Antibiotics: Inform the staff if you have a condition or reason that requires you to take antibiotics before dental procedures. . Pregnancy: If you are pregnant, call and cancel the procedure. . Sickness: If you have a cold, fever, or any active infections, call and cancel the procedure. . Arrival: You must be in the facility at least 30 minutes prior to your scheduled procedure. . Children: Do not bring any children with you. . Dress appropriately: Bring dark clothing that you would not mind if they get stained. . Valuables: Do not bring any jewelry or valuables.  Procedure appointments are reserved for interventional treatments only. Marland Kitchen No Prescription Refills. . No medication changes will be discussed during procedure appointments. . No disability issues will be discussed.  Reasons to call and reschedule or cancel your procedure: (Following these recommendations will minimize the risk of a serious complication.) . Surgeries: Avoid having procedures within 2 weeks of any surgery. (Avoid for 2 weeks before or after any surgery). . Flu Shots: Avoid having procedures within 2 weeks of a flu shots or . (Avoid for 2 weeks before or after immunizations). . Barium: Avoid having a procedure within 7-10 days after having had a radiological study involving the use of radiological contrast. (Myelograms, Barium swallow or enema study). . Heart attacks: Avoid any elective procedures or surgeries for the  initial 6 months after a "Myocardial Infarction" (Heart Attack). . Blood thinners: It is imperative that you stop these medications before procedures. Let us know if you if you take any blood thinner.  . Infection: Avoid procedures during or within two weeks of an infection (including chest colds or gastrointestinal problems). Symptoms associated with infections include: Localized redness, fever, chills, night sweats or profuse sweating, burning sensation when voiding, cough, congestion, stuffiness, runny nose, sore throat, diarrhea, nausea, vomiting, cold or Flu symptoms, recent or current infections. It is specially important if the infection is over the area that we intend to treat. Marland Kitchen Heart and lung problems: Symptoms that may suggest an active cardiopulmonary problem include: cough, chest pain, breathing difficulties or shortness of breath, dizziness, ankle swelling, uncontrolled high or unusually low blood pressure, and/or palpitations. If you are experiencing any of these symptoms, cancel your procedure and contact your primary care physician for an evaluation.  Remember:  Regular Business hours are:  Monday to Thursday 8:00 AM to 4:00 PM  Provider's Schedule: Milinda Pointer, MD:  Procedure days: Tuesday and Thursday 7:30 AM to 4:00 PM  Gillis Santa, MD:  Procedure days: Monday and Wednesday 7:30 AM to 4:00 PM ____________________________________________________________________________________________   ____________________________________________________________________________________________  Post-procedure Information What to expect: Most procedures involve the use of a local anesthetic (numbing medicine), and a steroid (anti-inflammatory medicine).  The local anesthetics may cause temporary numbness and weakness of the legs or arms, depending on the location of the block. This numbness/weakness may last 4-6 hours, depending on  the local anesthetic used. In rare instances, it can  last up to 24 hours. While numb, you must be very careful not to injure the extremity.  After any procedure, you could expect the pain to get better within 15-20 minutes. This relief is temporary and may last 4-6 hours. Once the local anesthetics wears off, you could experience discomfort, possibly more than usual, for up to 10 (ten) days. In the case of radiofrequencies, it may last up to 6 weeks. Surgeries may take up to 8 weeks for the healing process. The discomfort is due to the irritation caused by needles going through skin and muscle. To minimize the discomfort, we recommend using ice the first day, and heat from then on. The ice should be applied for 15 minutes on, and 15 minutes off. Keep repeating this cycle until bedtime. Avoid applying the ice directly to the skin, to prevent frostbite. Heat should be used daily, until the pain improves (4-10 days). Be careful not to burn yourself.  Occasionally you may experience muscle spasms or cramps. These occur as a consequence of the irritation caused by the needle sticks to the muscle and the blood that will inevitably be lost into the surrounding muscle tissue. Blood tends to be very irritating to tissues, which tend to react by going into spasm. These spasms may start the same day of your procedure, but they may also take days to develop. This late onset type of spasm or cramp is usually caused by electrolyte imbalances triggered by the steroids, at the level of the kidney. Cramps and spasms tend to respond well to muscle relaxants, multivitamins (some are triggered by the procedure, but may have their origins in vitamin deficiencies), and "Gatorade", or any sports drinks that can replenish any electrolyte imbalances. (If you are a diabetic, ask your pharmacist to get you a sugar-free brand.) Warm showers or baths may also be helpful. Stretching exercises are highly recommended.  General Instructions:  Be alert for signs of possible infection: redness,  swelling, heat, red streaks, elevated temperature, and/or fever. These typically appear 4 to 6 days after the procedure. Immediately notify your doctor if you experience unusual bleeding, difficulty breathing, or loss of bowel or bladder control. If you experience increased pain, do not increase your pain medicine intake, unless instructed by your pain physician.  Post-Procedure Care:  Be careful in moving about. Muscle spasms in the area of the injection may occur. Applying ice or heat to the area is often helpful. The incidence of spinal headaches after epidural injections ranges between 1.4% and 6%. If you develop a headache that does not seem to respond to conservative therapy, please let your physician know. This can be treated with an epidural blood patch.   Post-procedure numbness or redness is to be expected, however it should average 4 to 6 hours. If numbness and weakness of your extremities begins to develop 4 to 6 hours after your procedure, and is felt to be progressing and worsening, immediately contact your physician.  Diet:  If you experience nausea, do not eat until this sensation goes away. If you had a "Stellate Ganglion Block" for upper extremity "Reflex Sympathetic Dystrophy", do not eat or drink until your hoarseness goes away. In any case, always start with liquids first and if you tolerate them well, then slowly progress to more solid foods.  Activity:  For the first 4 to 6 hours after the procedure, use caution in moving about as you may experience numbness and/or weakness.  Use caution in cooking, using household electrical appliances, and climbing steps. If you need to reach your Doctor call our office: [phone] (During business hours) or [phone] (After business hours).  Business Hours: Monday-Thursday 8:00 am - 4:00 PM    Fridays: Closed     In case of an emergency: In case of emergency, call 911 or go to the nearest emergency room and have the physician  there call us.  Interpretation of Procedure Every nerve block has two components: a diagnostic component, and a treatment component. Unrealistic expectations are the most common causes of "perceived failure".  In a perfect world, a single nerve block should be able to completely and permanently eliminate the pain. Sadly, the world is not perfect.  Most pain management nerve blocks are performed using local anesthetics and steroids. Steroids are responsible for any long-term benefit that you may experience. Their purpose is to decrease any chronic swelling that may exist in the area. Steroids begin to work immediately after being injected. However, most patients will not experience any benefits until 5 to 10 days after the injection, when the swelling has come down to the point where they can tell a difference. Steroids will only help if there is swelling to be treated. As such, they can assist with the diagnosis. If effective, they suggest an inflammatory component to the pain, and if ineffective, they rule out inflammation as the main cause or component of the problem. If the problem is one of mechanical compression, you will get no benefit from those steroids.   In the case of local anesthetics, they have a crucial role in the diagnosis of your condition. Most will begin to work within15 to 20 minutes after injection. The duration will depend on the type used (short- vs. Long-acting). It is of outmost importance that patients keep tract of their pain, after the procedure. To assist with this matter, a "Post-procedure Pain Diary" is provided. Make sure to complete it and to bring it back to your follow-up appointment.  As long as the patient keeps accurate, detailed records of their symptoms after every procedure, and returns to have those interpreted, every procedure will provide [place] with invaluable information. Even a block that does not provide the patient with any relief, will always provide [place] with  information about the mechanism and the origin of the pain. The only time a nerve block can be considered a waste of time is when patients do not keep track of the results, or do not keep their post-procedure appointment.  Reporting the results back to your physician The Pain Score  Pain is a subjective complaint. It cannot be seen, touched, or measured. We depend entirely on the patient's report of the pain in order to assess your condition and treatment. To evaluate the pain, we use a pain scale, where "0" means "No Pain", and a "10" is "the worst possible pain that you can even imagine" (i.e. something like been eaten alive by a shark or being torn apart by a lion).   Use the Pain Scale provided. You will frequently be asked to rate your pain. Please be accurate, remember that medical decisions will be based on your responses. Please do not rate your pain above a 10. Doing so is actually interpreted as "symptom magnification" (exaggeration). To put this into perspective, when you tell us that your pain is at a 10 (ten), what you are saying is that there is nothing we can do to make this pain  any worse. (Carefully think about that.) ____________________________________________________________________________________________

## 2018-08-07 ENCOUNTER — Telehealth: Payer: Self-pay | Admitting: *Deleted

## 2018-08-07 NOTE — Telephone Encounter (Signed)
Attempted to call for post procedure follow-up. Message left. 

## 2018-08-08 ENCOUNTER — Ambulatory Visit: Payer: Medicare Other

## 2018-08-15 ENCOUNTER — Telehealth: Payer: Self-pay

## 2018-08-15 NOTE — Telephone Encounter (Signed)
Spoke with patient and notified him that he would not prescribe pain medication over the phone. Patient states he will speak to the Doctor when he comes in next week.

## 2018-08-15 NOTE — Telephone Encounter (Signed)
He has an appointment for procedure on Tuesday but wondered if he could get something for the pain to get him thru till then.

## 2018-08-16 ENCOUNTER — Ambulatory Visit (INDEPENDENT_AMBULATORY_CARE_PROVIDER_SITE_OTHER): Payer: Medicare Other | Admitting: Nurse Practitioner

## 2018-08-16 ENCOUNTER — Encounter: Payer: Self-pay | Admitting: Nurse Practitioner

## 2018-08-16 VITALS — BP 154/75 | HR 77 | Temp 97.7°F | Resp 20 | Ht 70.0 in

## 2018-08-16 DIAGNOSIS — R509 Fever, unspecified: Secondary | ICD-10-CM | POA: Diagnosis not present

## 2018-08-16 DIAGNOSIS — B354 Tinea corporis: Secondary | ICD-10-CM | POA: Diagnosis not present

## 2018-08-16 DIAGNOSIS — J0101 Acute recurrent maxillary sinusitis: Secondary | ICD-10-CM | POA: Diagnosis not present

## 2018-08-16 LAB — POCT INFLUENZA A/B
Influenza A, POC: NEGATIVE
Influenza B, POC: NEGATIVE

## 2018-08-16 MED ORDER — NYSTATIN 100000 UNIT/GM EX CREA: 1.0000 "application " | TOPICAL_CREAM | Freq: Two times a day (BID) | CUTANEOUS | 0 refills | Status: AC

## 2018-08-16 MED ORDER — LEVOFLOXACIN 750 MG PO TABS: 750.0000 mg | ORAL_TABLET | Freq: Every day | ORAL | 0 refills | Status: AC

## 2018-08-16 NOTE — Patient Instructions (Addendum)
Aaron Gibbs,   Thank you for coming in to clinic today.  1. START levaquin 750 mg one tablet daily for 5 days  2. START Tussin DM or other cough medication-DM for your cough  - You may try over the counter Nasal Saline spray (Simply Saline, Ocean Spray) as needed to reduce congestion. - Start taking Tylenol extra strength 1 to 2 tablets every 6-8 hours for aches or fever/chills for next few days as needed.  Do not take more than 3,000 mg in 24 hours from all medicines.  - Drink warm herbal tea with honey for sore throat.   If symptoms are significantly worse with persistent fevers/chills despite tylenol/ibpurofen, nausea, vomiting unable to tolerate food/fluids or medicine, body aches, or shortness of breath, sinus pain pressure or worsening productive cough, then follow-up for re-evaluation, may seek more immediate care at Urgent Care or the ED if you are more concerned that it is an emergency.  Please schedule a follow-up appointment with Cassell Smiles, AGNP. Return 5-7 days if symptoms worsen or fail to improve.  If you have any other questions or concerns, please feel free to call the clinic or send a message through [place]. You may also schedule an earlier appointment if necessary.  You will receive a survey after today's visit either digitally by e-mail or paper by [employer]. Your experiences and feedback matter to [place].  Please respond so we know how we are doing as we provide care for you.   Cassell Smiles, DNP, AGNP-BC Adult Gerontology Nurse Practitioner [facility]

## 2018-08-16 NOTE — Progress Notes (Signed)
Subjective:    Patient ID: Aaron Gibbs, male    DOB: 04-10-1935, 84 y.o.   MRN: [id number]  Aaron Gibbs is a 84 y.o. male presenting on 08/16/2018 for Influenza (coughing, congestion, bodyaches, fatigue, no appetite  and abdominal pain x 3 days )   HPI URI symptoms Patient returns to clinic with URI symptoms after being treated for URI also about 1 month ago. Patient has had worsening of symptoms for about 7 days and stomach began hurting about 2 days ago. Patient has been taking doxycycline he "had on hand" and it is not helping. - No wheezing or regular shortness of breath. Notes some difficulty with nasal stuffiness.  - Patient is taking Nyquil for cough and pepto bismol for stomach without much relief. - Has not has any n/v/d/c - Very decreased appetite - Patient is drinking some protein shakes, but not much else.  Only drinking water with medications.  Social History   Tobacco Use  . Smoking status: Former Smoker    Years: 15.00  . Smokeless tobacco: Current User    Types: Chew  Substance Use Topics  . Alcohol use: Not Currently    Comment: past  . Drug use: Never    Review of Systems Per HPI unless specifically indicated above     Objective:    BP (!) 154/75   Pulse 77   Temp 97.7 F (36.5 C) (Oral)   Resp 20   Ht 5\' 10"  (1.778 m)   SpO2 97%   BMI 31.57 kg/m   Wt Readings from Last 3 Encounters:  08/06/18 220 lb (99.8 kg)  07/24/18 220 lb (99.8 kg)  07/12/18 233 lb (105.7 kg)    Physical Exam Vitals signs reviewed.  Constitutional:      Appearance: He is well-developed. He is obese. He is ill-appearing.  HENT:     Head: Normocephalic and atraumatic.     Right Ear: Hearing, tympanic membrane, ear canal and external ear normal.     Left Ear: Hearing, tympanic membrane, ear canal and external ear normal.     Nose: Mucosal edema and rhinorrhea present.     Right Sinus: Maxillary sinus tenderness and frontal sinus tenderness present.     Left  Sinus: Maxillary sinus tenderness and frontal sinus tenderness present.     Mouth/Throat:     Lips: Pink.     Mouth: Mucous membranes are moist.     Pharynx: Uvula midline. Pharyngeal swelling and posterior oropharyngeal erythema (mildly injected) present. No oropharyngeal exudate or uvula swelling.     Tonsils: No tonsillar exudate. Swelling: 1+ on the right. 1+ on the left.  Eyes:     General: Lids are normal.        Right eye: No discharge.        Left eye: No discharge.     Conjunctiva/sclera: Conjunctivae normal.     Pupils: Pupils are equal, round, and reactive to light.  Neck:     Musculoskeletal: Full passive range of motion without pain, normal range of motion and neck supple.  Cardiovascular:     Rate and Rhythm: Normal rate and regular rhythm.     Pulses: Normal pulses.     Heart sounds: Normal heart sounds, S1 normal and S2 normal.  Pulmonary:     Effort: Pulmonary effort is normal. No respiratory distress.     Breath sounds: Normal breath sounds.  Musculoskeletal:     Right lower leg: No edema.     Left lower  leg: No edema.  Lymphadenopathy:     Cervical: Cervical adenopathy present.     Right cervical: Superficial cervical adenopathy present.     Left cervical: Superficial cervical adenopathy present.  Skin:    General: Skin is warm and dry.     Capillary Refill: Capillary refill takes less than 2 seconds.  Neurological:     General: No focal deficit present.     Mental Status: He is alert.     GCS: GCS eye subscore is 4. GCS verbal subscore is 5. GCS motor subscore is 6.  Psychiatric:        Attention and Perception: Attention normal.        Mood and Affect: Mood normal.        Behavior: Behavior normal. Behavior is cooperative.        Thought Content: Thought content normal.        Judgment: Judgment normal.    Results for orders placed or performed in visit on 08/16/18  POCT Influenza A/B  Result Value Ref Range   Influenza A, POC Negative Negative    Influenza B, POC Negative Negative      Assessment & Plan:   Problem List Items Addressed This Visit    None    Visit Diagnoses    Fever and chills    -  Primary   Relevant Orders   POCT Influenza A/B (Completed)   Acute recurrent maxillary sinusitis       Relevant Medications   levofloxacin (LEVAQUIN) 750 MG tablet   Tinea corporis       Relevant Medications   nystatin cream (MYCOSTATIN)    Consistent with URI and secondary sinusitis with symptoms worsening over the past 7 days and initial symptoms of nasal congestion and sinus pressure over 4 weeks ago.   Plan: 1.START taking levofloxacin 750 mg one tab daily for 5 days.  Discussed completing antibiotic. - While on antibiotic, take a probiotic OTC or from food. - Can use Flonase 2 sprays each nostril daily for up to 4-6 weeks if no epistaxis. - Start Mucinex-DM OTC for  7-10 days prn congestion 2. Supportive care with nasal saline, warm herbal tea with honey, 3. Improve hydration 4. Tylenol / Motrin PRN fevers  5. Return criteria given     Patient has regular pannus fungus.  Has not had nystatin cream recently.  Requests prescription, so was sent today.  FOLLOW-UP with Dr. Raliegh Ip as needed.  Meds ordered this encounter  Medications  . levofloxacin (LEVAQUIN) 750 MG tablet    Sig: Take 1 tablet (750 mg total) by mouth daily for 5 days.    Dispense:  5 tablet    Refill:  0    Order Specific Question:   Supervising Provider    Answer:   Olin Hauser [[id number]]  . nystatin cream (MYCOSTATIN)    Sig: Apply 1 application topically 2 (two) times daily for 7 days.    Dispense:  30 g    Refill:  0    Order Specific Question:   Supervising Provider    Answer:   Olin Hauser [[id number]]    Follow up plan: Return 5-7 days if symptoms worsen or fail to improve.  Aaron Smiles, DNP, AGPCNP-BC Adult Gerontology Primary Care Nurse Practitioner [facility] 08/16/2018, 3:41  PM

## 2018-08-17 ENCOUNTER — Encounter: Payer: Self-pay | Admitting: Nurse Practitioner

## 2018-08-20 ENCOUNTER — Encounter: Payer: Self-pay | Admitting: Pain Medicine

## 2018-08-20 ENCOUNTER — Ambulatory Visit: Payer: Medicare Other | Attending: Pain Medicine | Admitting: Pain Medicine

## 2018-08-20 VITALS — BP 137/79 | HR 86 | Temp 98.0°F | Resp 15 | Ht 70.0 in | Wt 220.0 lb

## 2018-08-20 DIAGNOSIS — M25561 Pain in right knee: Secondary | ICD-10-CM | POA: Insufficient documentation

## 2018-08-20 DIAGNOSIS — M170 Bilateral primary osteoarthritis of knee: Secondary | ICD-10-CM

## 2018-08-20 DIAGNOSIS — M792 Neuralgia and neuritis, unspecified: Secondary | ICD-10-CM | POA: Diagnosis present

## 2018-08-20 DIAGNOSIS — Z79899 Other long term (current) drug therapy: Secondary | ICD-10-CM | POA: Insufficient documentation

## 2018-08-20 DIAGNOSIS — Z915 Personal history of self-harm: Secondary | ICD-10-CM | POA: Insufficient documentation

## 2018-08-20 DIAGNOSIS — G8929 Other chronic pain: Secondary | ICD-10-CM | POA: Diagnosis present

## 2018-08-20 DIAGNOSIS — M1711 Unilateral primary osteoarthritis, right knee: Secondary | ICD-10-CM

## 2018-08-20 DIAGNOSIS — R45851 Suicidal ideations: Secondary | ICD-10-CM | POA: Diagnosis present

## 2018-08-20 DIAGNOSIS — M25562 Pain in left knee: Secondary | ICD-10-CM | POA: Diagnosis present

## 2018-08-20 DIAGNOSIS — T1491XA Suicide attempt, initial encounter: Secondary | ICD-10-CM

## 2018-08-20 DIAGNOSIS — M159 Polyosteoarthritis, unspecified: Secondary | ICD-10-CM

## 2018-08-20 DIAGNOSIS — Z9151 Personal history of suicidal behavior: Secondary | ICD-10-CM

## 2018-08-20 DIAGNOSIS — M150 Primary generalized (osteo)arthritis: Secondary | ICD-10-CM | POA: Insufficient documentation

## 2018-08-20 DIAGNOSIS — G894 Chronic pain syndrome: Secondary | ICD-10-CM | POA: Diagnosis present

## 2018-08-20 MED ORDER — DICLOFENAC SODIUM 1 % TD GEL: 2.0000 g | Freq: Four times a day (QID) | TRANSDERMAL | 99 refills | Status: DC

## 2018-08-20 MED ORDER — LIDOCAINE HCL (PF) 1 % IJ SOLN
5.0000 mL | Freq: Once | INTRAMUSCULAR | Status: AC
  Administered 2018-08-20: 5 mL

## 2018-08-20 MED ORDER — LIDOCAINE HCL (PF) 1 % IJ SOLN
INTRAMUSCULAR | Status: AC
  Filled 2018-08-20: qty 5

## 2018-08-20 MED ORDER — PREGABALIN 100 MG PO CAPS: 100.0000 mg | ORAL_CAPSULE | Freq: Four times a day (QID) | ORAL | 2 refills | Status: DC

## 2018-08-20 MED ORDER — ROPIVACAINE HCL 2 MG/ML IJ SOLN
INTRAMUSCULAR | Status: AC
  Filled 2018-08-20: qty 10

## 2018-08-20 MED ORDER — SODIUM HYALURONATE (VISCOSUP) 20 MG/2ML IX SOSY
2.0000 mL | PREFILLED_SYRINGE | Freq: Once | INTRA_ARTICULAR | Status: AC
  Administered 2018-08-20: 2 mL via INTRA_ARTICULAR

## 2018-08-20 MED ORDER — ROPIVACAINE HCL 2 MG/ML IJ SOLN
5.0000 mL | Freq: Once | INTRAMUSCULAR | Status: AC
  Administered 2018-08-20: 5 mL via INTRA_ARTICULAR

## 2018-08-20 NOTE — Patient Instructions (Signed)
____________________________________________________________________________________________  Post-Procedure Discharge Instructions  Instructions:  Apply ice:   Purpose: This will minimize any swelling and discomfort after procedure.   When: Day of procedure, as soon as you get home.  How: Fill a plastic sandwich bag with crushed ice. Cover it with a small towel and apply to injection site.  How long: (15 min on, 15 min off) Apply for 15 minutes then remove x 15 minutes.  Repeat sequence on day of procedure, until you go to bed.  Apply heat:   Purpose: To treat any soreness and discomfort from the procedure.  When: Starting the next day after the procedure.  How: Apply heat to procedure site starting the day following the procedure.  How long: May continue to repeat daily, until discomfort goes away.  Food intake: Start with clear liquids (like water) and advance to regular food, as tolerated.   Physical activities: Keep activities to a minimum for the first 8 hours after the procedure. After that, then as tolerated.  Driving: If you have received any sedation, be responsible and do not drive. You are not allowed to drive for 24 hours after having sedation.  Blood thinner: (Applies only to those taking blood thinners) You may restart your blood thinner 6 hours after your procedure.  Insulin: (Applies only to Diabetic patients taking insulin) As soon as you can eat, you may resume your normal dosing schedule.  Infection prevention: Keep procedure site clean and dry. Shower daily and clean area with soap and water.  Post-procedure Pain Diary: Extremely important that this be done correctly and accurately. Recorded information will be used to determine the next step in treatment. For the purpose of accuracy, follow these rules:  Evaluate only the area treated. Do not report or include pain from an untreated area. For the purpose of this evaluation, ignore all other areas of pain,  except for the treated area.  After your procedure, avoid taking a long nap and attempting to complete the pain diary after you wake up. Instead, set your alarm clock to go off every hour, on the hour, for the initial 8 hours after the procedure. Document the duration of the numbing medicine, and the relief you are getting from it.  Do not go to sleep and attempt to complete it later. It will not be accurate. If you received sedation, it is likely that you were given a medication that may cause amnesia. Because of this, completing the diary at a later time may cause the information to be inaccurate. This information is needed to plan your care.  Follow-up appointment: Keep your post-procedure follow-up evaluation appointment after the procedure (usually 2 weeks for most procedures, 6 weeks for radiofrequencies). DO NOT FORGET to bring you pain diary with you.   Expect: (What should I expect to see with my procedure?)  From numbing medicine (AKA: Local Anesthetics): Numbness or decrease in pain. You may also experience some weakness, which if present, could last for the duration of the local anesthetic.  Onset: Full effect within 15 minutes of injected.  Duration: It will depend on the type of local anesthetic used. On the average, 1 to 8 hours.   From steroids (Applies only if steroids were used): Decrease in swelling or inflammation. Once inflammation is improved, relief of the pain will follow.  Onset of benefits: Depends on the amount of swelling present. The more swelling, the longer it will take for the benefits to be seen. In some cases, up to 10 days.    Duration: Steroids will stay in the system x 2 weeks. Duration of benefits will depend on multiple posibilities including persistent irritating factors.  Side-effects: If present, they may typically last 2 weeks (the duration of the steroids).  Frequent: Cramps (if they occur, drink Gatorade and take over-the-counter Magnesium 450-500 mg  once to twice a day); water retention with temporary weight gain; increases in blood sugar; decreased immune system response; increased appetite.  Occasional: Facial flushing (red, warm cheeks); mood swings; menstrual changes.  Uncommon: Long-term decrease or suppression of natural hormones; bone thinning. (These are more common with higher doses or more frequent use. This is why we prefer that our patients avoid having any injection therapies in other practices.)   Very Rare: Severe mood changes; psychosis; aseptic necrosis.  From procedure: Some discomfort is to be expected once the numbing medicine wears off. This should be minimal if ice and heat are applied as instructed.  Call if: (When should I call?)  You experience numbness and weakness that gets worse with time, as opposed to wearing off.  New onset bowel or bladder incontinence. (Applies only to procedures done in the spine)  Emergency Numbers:  Durning business hours (Monday - Thursday, 8:00 AM - 4:00 PM) (Friday, 9:00 AM - 12:00 Noon): [phone]  After hours: [phone]  NOTE: If you are having a problem and are unable connect with, or to talk to a provider, then go to your nearest urgent care or emergency department. If the problem is serious and urgent, please call 911. ____________________________________________________________________________________________   ____________________________________________________________________________________________  Preparing for your procedure (without sedation)  Instructions: . Oral Intake: Do not eat or drink anything for at least 3 hours prior to your procedure. . Transportation: Unless otherwise stated by your physician, you may drive yourself after the procedure. . Blood Pressure Medicine: Take your blood pressure medicine with a sip of water the morning of the procedure. . Blood thinners: Notify our staff if you are taking any blood thinners. Depending on which  one you take, there will be specific instructions on how and when to stop it. . Diabetics on insulin: Notify the staff so that you can be scheduled 1st case in the morning. If your diabetes requires high dose insulin, take only  of your normal insulin dose the morning of the procedure and notify the staff that you have done so. . Preventing infections: Shower with an antibacterial soap the morning of your procedure.  . Build-up your immune system: Take 1000 mg of Vitamin C with every meal (3 times a day) the day prior to your procedure. Marland Kitchen Antibiotics: Inform the staff if you have a condition or reason that requires you to take antibiotics before dental procedures. . Pregnancy: If you are pregnant, call and cancel the procedure. . Sickness: If you have a cold, fever, or any active infections, call and cancel the procedure. . Arrival: You must be in the facility at least 30 minutes prior to your scheduled procedure. . Children: Do not bring any children with you. . Dress appropriately: Bring dark clothing that you would not mind if they get stained. . Valuables: Do not bring any jewelry or valuables.  Procedure appointments are reserved for interventional treatments only. Marland Kitchen No Prescription Refills. . No medication changes will be discussed during procedure appointments. . No disability issues will be discussed.  Reasons to call and reschedule or cancel your procedure: (Following these recommendations will minimize the risk of a serious complication.) . Surgeries: Avoid having  procedures within 2 weeks of any surgery. (Avoid for 2 weeks before or after any surgery). . Flu Shots: Avoid having procedures within 2 weeks of a flu shots or . (Avoid for 2 weeks before or after immunizations). . Barium: Avoid having a procedure within 7-10 days after having had a radiological study involving the use of radiological contrast. (Myelograms, Barium swallow or enema study). . Heart attacks: Avoid any elective  procedures or surgeries for the initial 6 months after a "Myocardial Infarction" (Heart Attack). . Blood thinners: It is imperative that you stop these medications before procedures. Let us know if you if you take any blood thinner.  . Infection: Avoid procedures during or within two weeks of an infection (including chest colds or gastrointestinal problems). Symptoms associated with infections include: Localized redness, fever, chills, night sweats or profuse sweating, burning sensation when voiding, cough, congestion, stuffiness, runny nose, sore throat, diarrhea, nausea, vomiting, cold or Flu symptoms, recent or current infections. It is specially important if the infection is over the area that we intend to treat. Marland Kitchen Heart and lung problems: Symptoms that may suggest an active cardiopulmonary problem include: cough, chest pain, breathing difficulties or shortness of breath, dizziness, ankle swelling, uncontrolled high or unusually low blood pressure, and/or palpitations. If you are experiencing any of these symptoms, cancel your procedure and contact your primary care physician for an evaluation.  Remember:  Regular Business hours are:  Monday to Thursday 8:00 AM to 4:00 PM  Provider's Schedule: Milinda Pointer, MD:  Procedure days: Tuesday and Thursday 7:30 AM to 4:00 PM  Gillis Santa, MD:  Procedure days: Monday and Wednesday 7:30 AM to 4:00 PM ____________________________________________________________________________________________    ______________________________________________________________________________________________  Specialty Pain Scale  Introduction:  There are significant differences in how pain is reported. The word pain usually refers to physical pain, but it is also a common synonym of suffering. The medical community uses a scale from 0 (zero) to 10 (ten) to report pain level. Zero (0) is described as "no pain", while ten (10) is described as "the worse pain you  can imagine". The problem with this scale is that physical pain is reported along with suffering. Suffering refers to mental pain, or more often yet it refers to any unpleasant feeling, emotion or aversion associated with the perception of harm or threat of harm. It is the psychological component of pain.  Pain Specialists prefer to separate the two components. The pain scale used by this practice is the Verbal Numerical Rating Scale (VNRS-11). This scale is for the physical pain only. DO NOT INCLUDE how your pain psychologically affects you. This scale is for adults 53 years of age and older. It has 11 (eleven) levels. The 1st level is 0/10. This means: "right now, I have no pain". In the context of pain management, it also means: "right now, my physical pain is under control with the current therapy".  General Information:  The scale should reflect your current level of pain. Unless you are specifically asked for the level of your worst pain, or your average pain. If you are asked for one of these two, then it should be understood that it is over the past 24 hours.  Levels 1 (one) through 5 (five) are described below, and can be treated as an outpatient. Ambulatory pain management facilities such as ours are more than adequate to treat these levels. Levels 6 (six) through 10 (ten) are also described below, however, these must be treated as a hospitalized patient.  While levels 6 (six) and 7 (seven) may be evaluated at an urgent care facility, levels 8 (eight) through 10 (ten) constitute medical emergencies and as such, they belong in a hospital's emergency department. When having these levels (as described below), do not come to our office. Our facility is not equipped to manage these levels. Go directly to an urgent care facility or an emergency department to be evaluated.  Definitions:  Activities of Daily Living (ADL): Activities of daily living (ADL or ADLs) is a term used in healthcare to refer to  people's daily self-care activities. Health professionals often use a person's ability or inability to perform ADLs as a measurement of their functional status, particularly in regard to people post injury, with disabilities and the elderly. There are two ADL levels: Basic and Instrumental. Basic Activities of Daily Living (BADL  or BADLs) consist of self-care tasks that include: Bathing and showering; personal hygiene and grooming (including brushing/combing/styling hair); dressing; Toilet hygiene (getting to the toilet, cleaning oneself, and getting back up); eating and self-feeding (not including cooking or chewing and swallowing); functional mobility, often referred to as "transferring", as measured by the ability to walk, get in and out of bed, and get into and out of a chair; the broader definition (moving from one place to another while performing activities) is useful for people with different physical abilities who are still able to get around independently. Basic ADLs include the things many people do when they get up in the morning and get ready to go out of the house: get out of bed, go to the toilet, bathe, dress, groom, and eat. On the average, loss of function typically follows a particular order. Hygiene is the first to go, followed by loss of toilet use and locomotion. The last to go is the ability to eat. When there is only one remaining area in which the person is independent, there is a 62.9% chance that it is eating and only a 3.5% chance that it is hygiene. Instrumental Activities of Daily Living (IADL or IADLs) are not necessary for fundamental functioning, but they let an individual live independently in a community. IADL consist of tasks that include: cleaning and maintaining the house; home establishment and maintenance; care of others (including selecting and supervising caregivers); care of pets; child rearing; managing money; managing financials (investments, etc.); meal preparation  and cleanup; shopping for groceries and necessities; moving within the community; safety procedures and emergency responses; health management and maintenance (taking prescribed medications); and using the telephone or other form of communication.  Instructions:  Most patients tend to report their pain as a combination of two factors, their physical pain and their psychosocial pain. This last one is also known as "suffering" and it is reflection of how physical pain affects you socially and psychologically. From now on, report them separately.  From this point on, when asked to report your pain level, report only your physical pain. Use the following table for reference.  Pain Clinic Pain Levels (0-5/10)  Pain Level Score  Description  No Pain 0   Mild pain 1 Nagging, annoying, but does not interfere with basic activities of daily living (ADL). Patients are able to eat, bathe, get dressed, toileting (being able to get on and off the toilet and perform personal hygiene functions), transfer (move in and out of bed or a chair without assistance), and maintain continence (able to control bladder and bowel functions). Blood pressure and heart rate are unaffected. A normal heart rate  for a healthy adult ranges from 60 to 100 bpm (beats per minute).   Mild to moderate pain 2 Noticeable and distracting. Impossible to hide from other people. More frequent flare-ups. Still possible to adapt and function close to normal. It can be very annoying and may have occasional stronger flare-ups. With discipline, patients may get used to it and adapt.   Moderate pain 3 Interferes significantly with activities of daily living (ADL). It becomes difficult to feed, bathe, get dressed, get on and off the toilet or to perform personal hygiene functions. Difficult to get in and out of bed or a chair without assistance. Very distracting. With effort, it can be ignored when deeply involved in activities.   Moderately severe pain  4 Impossible to ignore for more than a few minutes. With effort, patients may still be able to manage work or participate in some social activities. Very difficult to concentrate. Signs of autonomic nervous system discharge are evident: dilated pupils (mydriasis); mild sweating (diaphoresis); sleep interference. Heart rate becomes elevated (>115 bpm). Diastolic blood pressure (lower number) rises above 100 mmHg. Patients find relief in laying down and not moving.   Severe pain 5 Intense and extremely unpleasant. Associated with frowning face and frequent crying. Pain overwhelms the senses.  Ability to do any activity or maintain social relationships becomes significantly limited. Conversation becomes difficult. Pacing back and forth is common, as getting into a comfortable position is nearly impossible. Pain wakes you up from deep sleep. Physical signs will be obvious: pupillary dilation; increased sweating; goosebumps; brisk reflexes; cold, clammy hands and feet; nausea, vomiting or dry heaves; loss of appetite; significant sleep disturbance with inability to fall asleep or to remain asleep. When persistent, significant weight loss is observed due to the complete loss of appetite and sleep deprivation.  Blood pressure and heart rate becomes significantly elevated. Caution: If elevated blood pressure triggers a pounding headache associated with blurred vision, then the patient should immediately seek attention at an urgent or emergency care unit, as these may be signs of an impending stroke.    Emergency Department Pain Levels (6-10/10)  Emergency Room Pain 6 Severely limiting. Requires emergency care and should not be seen or managed at an outpatient pain management facility. Communication becomes difficult and requires great effort. Assistance to reach the emergency department may be required. Facial flushing and profuse sweating along with potentially dangerous increases in heart rate and blood pressure  will be evident.   Distressing pain 7 Self-care is very difficult. Assistance is required to transport, or use restroom. Assistance to reach the emergency department will be required. Tasks requiring coordination, such as bathing and getting dressed become very difficult.   Disabling pain 8 Self-care is no longer possible. At this level, pain is disabling. The individual is unable to do even the most "basic" activities such as walking, eating, bathing, dressing, transferring to a bed, or toileting. Fine motor skills are lost. It is difficult to think clearly.   Incapacitating pain 9 Pain becomes incapacitating. Thought processing is no longer possible. Difficult to remember your own name. Control of movement and coordination are lost.   The worst pain imaginable 10 At this level, most patients pass out from pain. When this level is reached, collapse of the autonomic nervous system occurs, leading to a sudden drop in blood pressure and heart rate. This in turn results in a temporary and dramatic drop in blood flow to the brain, leading to a loss of consciousness. Fainting is one  of the body's self defense mechanisms. Passing out puts the brain in a calmed state and causes it to shut down for a while, in order to begin the healing process.    Summary: 1. Refer to this scale when providing [place] with your pain level. 2. Be accurate and careful when reporting your pain level. This will help with your care. 3. Over-reporting your pain level will lead to loss of credibility. 4. Even a level of 1/10 means that there is pain and will be treated at our facility. 5. High, inaccurate reporting will be documented as "Symptom Exaggeration", leading to loss of credibility and suspicions of possible secondary gains such as obtaining more narcotics, or wanting to appear disabled, for fraudulent reasons. 6. Only pain levels of 5 or below will be seen at our facility. 7. Pain levels of 6 and above will be sent to the  Emergency Department and the appointment cancelled. ______________________________________________________________________________________________

## 2018-08-20 NOTE — Progress Notes (Signed)
Patient's Name: Aaron Gibbs  MRN: [id number]  Referring Provider: [name] *  DOB: 07/26/1934  PCP: Olin Hauser, DO  DOS: 08/20/2018  Note by: Gaspar Cola, MD  Service setting: Ambulatory outpatient  Specialty: Interventional Pain Management  Patient type: Established  Location: [facility] (AMB) Pain Management Facility  Visit type: Interventional Procedure   Primary Reason for Visit: Interventional Pain Management Treatment. CC: Knee Pain (bilateral, left is worse )  Procedure:          Anesthesia, Analgesia, Anxiolysis:  Type: Therapeutic Intra-Articular Hyalgan Knee Injection #2  Region: Lateral infrapatellar Knee Region Level: Knee Joint Laterality: Bilateral  Type: Local Anesthesia Indication(s): Analgesia         Local Anesthetic: Lidocaine 1-2% Route: Infiltration ([place]/IM) IV Access: Declined Sedation: Declined   Position: Sitting   Indications: 1. Osteoarthritis of knee (Bilateral)   2. Osteoarthritis of patellofemoral joint (Right)   3. Chronic knee pain (Primary Area of Pain) (Bilateral) (L>R)    Pain Score: Pre-procedure: 7 /10 Post-procedure: 5 /10  Pre-op Assessment:  Mr. Bollard is a 84 y.o. (year old), male patient, seen today for interventional treatment. He  has a past surgical history that includes Cholecystectomy and Transurethral resection of prostate (N/A, 2008). Mr. Dunavan has a current medication list which includes the following prescription(s): amlodipine, aspirin ec, duloxetine, fenofibrate, ferrous sulfate, meloxicam, multivitamin with minerals, nystatin cream, pantoprazole, polyethylene glycol, prednisolone acetate, pregabalin, quetiapine, senna, timolol, umeclidinium-vilanterol, diclofenac sodium, doxycycline, levofloxacin, and oxycodone-acetaminophen. His primarily concern today is the Knee Pain (bilateral, left is worse )  Initial Vital Signs:  Pulse/HCG Rate: 86ECG Heart Rate: 75 Temp: 98 F (36.7 C) Resp: 16 BP:  125/79 SpO2: 98 %  BMI: Estimated body mass index is 31.57 kg/m as calculated from the following:   Height as of this encounter: 5\' 10"  (1.778 m).   Weight as of this encounter: 220 lb (99.8 kg).  Risk Assessment: Allergies: Reviewed. He is allergic to azithromycin.  Allergy Precautions: None required Coagulopathies: Reviewed. None identified.  Blood-thinner therapy: None at this time Active Infection(s): Reviewed. None identified. Mr. Minus is afebrile  Site Confirmation: Mr. Weant was asked to confirm the procedure and laterality before marking the site Procedure checklist: Completed Consent: Before the procedure and under the influence of no sedative(s), amnesic(s), or anxiolytics, the patient was informed of the treatment options, risks and possible complications. To fulfill our ethical and legal obligations, as recommended by the American Medical Association's Code of Ethics, I have informed the patient of my clinical impression; the nature and purpose of the treatment or procedure; the risks, benefits, and possible complications of the intervention; the alternatives, including doing nothing; the risk(s) and benefit(s) of the alternative treatment(s) or procedure(s); and the risk(s) and benefit(s) of doing nothing. The patient was provided information about the general risks and possible complications associated with the procedure. These may include, but are not limited to: failure to achieve desired goals, infection, bleeding, organ or nerve damage, allergic reactions, paralysis, and death. In addition, the patient was informed of those risks and complications associated to the procedure, such as failure to decrease pain; infection; bleeding; organ or nerve damage with subsequent damage to sensory, motor, and/or autonomic systems, resulting in permanent pain, numbness, and/or weakness of one or several areas of the body; allergic reactions; (i.e.: anaphylactic reaction); and/or  death. Furthermore, the patient was informed of those risks and complications associated with the medications. These include, but are not limited to: allergic reactions (i.e.: anaphylactic  or anaphylactoid reaction(s)); adrenal axis suppression; blood sugar elevation that in diabetics may result in ketoacidosis or comma; water retention that in patients with history of congestive heart failure may result in shortness of breath, pulmonary edema, and decompensation with resultant heart failure; weight gain; swelling or edema; medication-induced neural toxicity; particulate matter embolism and blood vessel occlusion with resultant organ, and/or nervous system infarction; and/or aseptic necrosis of one or more joints. Finally, the patient was informed that Medicine is not an exact science; therefore, there is also the possibility of unforeseen or unpredictable risks and/or possible complications that may result in a catastrophic outcome. The patient indicated having understood very clearly. We have given the patient no guarantees and we have made no promises. Enough time was given to the patient to ask questions, all of which were answered to the patient's satisfaction. Mr. Langston has indicated that he wanted to continue with the procedure. Attestation: I, the ordering provider, attest that I have discussed with the patient the benefits, risks, side-effects, alternatives, likelihood of achieving goals, and potential problems during recovery for the procedure that I have provided informed consent. Date  Time: 08/20/2018 10:23 AM  Pre-Procedure Preparation:  Monitoring: As per clinic protocol. Respiration, ETCO2, SpO2, BP, heart rate and rhythm monitor placed and checked for adequate function Safety Precautions: Patient was assessed for positional comfort and pressure points before starting the procedure. Time-out: I initiated and conducted the "Time-out" before starting the procedure, as per protocol. The  patient was asked to participate by confirming the accuracy of the "Time Out" information. Verification of the correct person, site, and procedure were performed and confirmed by me, the nursing staff, and the patient. "Time-out" conducted as per Joint Commission's Universal Protocol (UP.01.01.01). Time: 1046  Description of Procedure:          Target Area: Knee Joint Approach: Just above the Lateral tibial plateau, lateral to the infrapatellar tendon. Area Prepped: Entire knee area, from the mid-thigh to the mid-shin. Prepping solution: ChloraPrep (2% chlorhexidine gluconate and 70% isopropyl alcohol) Safety Precautions: Aspiration looking for blood return was conducted prior to all injections. At no point did we inject any substances, as a needle was being advanced. No attempts were made at seeking any paresthesias. Safe injection practices and needle disposal techniques used. Medications properly checked for expiration dates. SDV (single dose vial) medications used. Description of the Procedure: Protocol guidelines were followed. The patient was placed in position over the fluoroscopy table. The target area was identified and the area prepped in the usual manner. Skin & deeper tissues infiltrated with local anesthetic. Appropriate amount of time allowed to pass for local anesthetics to take effect. The procedure needles were then advanced to the target area. Proper needle placement secured. Negative aspiration confirmed. Solution injected in intermittent fashion, asking for systemic symptoms every 0.5cc of injectate. The needles were then removed and the area cleansed, making sure to leave some of the prepping solution back to take advantage of its long term bactericidal properties. Vitals:   08/20/18 1022 08/20/18 1102  BP: 125/79 137/79  Pulse: 86   Resp: 16 15  Temp: 98 F (36.7 C)   TempSrc: Oral   SpO2: 98% 98%  Weight: 220 lb (99.8 kg)   Height: 5\' 10"  (1.778 m)     Start Time: 1046  hrs. End Time:   hrs. Materials:  Needle(s) Type: Regular needle Gauge: 25G Length: 1.5-in Medication(s): Please see orders for medications and dosing details.  Imaging Guidance:  Type of Imaging Technique: None used Indication(s): N/A Exposure Time: No patient exposure Contrast: None used. Fluoroscopic Guidance: N/A Ultrasound Guidance: N/A Interpretation: N/A  Antibiotic Prophylaxis:   Anti-infectives (From admission, onward)   None     Indication(s): None identified  Post-operative Assessment:  Post-procedure Vital Signs:  Pulse/HCG Rate: 8675 Temp: 98 F (36.7 C) Resp: 15 BP: 137/79 SpO2: 98 %  EBL: None  Complications: No immediate post-treatment complications observed by team, or reported by patient.  Note: The patient tolerated the entire procedure well. A repeat set of vitals were taken after the procedure and the patient was kept under observation following institutional policy, for this type of procedure. Post-procedural neurological assessment was performed, showing return to baseline, prior to discharge. The patient was provided with post-procedure discharge instructions, including a section on how to identify potential problems. Should any problems arise concerning this procedure, the patient was given instructions to immediately contact us, at any time, without hesitation. In any case, we plan to contact the patient by telephone for a follow-up status report regarding this interventional procedure.  Comments:  No additional relevant information.  Plan of Care   Imaging Orders  No imaging studies ordered today    Procedure Orders     KNEE INJECTION     KNEE INJECTION  Medications ordered for procedure: Meds ordered this encounter  Medications  . lidocaine (PF) (XYLOCAINE) 1 % injection 5 mL  . ropivacaine (PF) 2 mg/mL (0.2%) (NAROPIN) injection 5 mL  . Sodium Hyaluronate SOSY 2 mL  . Sodium Hyaluronate SOSY 2 mL  . pregabalin (LYRICA)  100 MG capsule    Sig: Take 1 capsule (100 mg total) by mouth 4 (four) times daily.    Dispense:  120 capsule    Refill:  2    Do not place medication on "Automatic Refill". Fill one day early if pharmacy is closed on scheduled refill date.  . diclofenac sodium (VOLTAREN) 1 % GEL    Sig: Apply 2 g topically 4 (four) times daily.    Dispense:  100 g    Refill:  PRN    Do not place medication on "Automatic Refill". Fill one day early if pharmacy is closed on scheduled refill date.   Medications administered: We administered lidocaine (PF), ropivacaine (PF) 2 mg/mL (0.2%), Sodium Hyaluronate, and Sodium Hyaluronate.  See the medical record for exact dosing, route, and time of administration.  Disposition: Discharge home  Discharge Date & Time: 08/20/2018; 1104 hrs.   Physician-requested Follow-up: Return for Hyalgan (no sedation) (2 wks): (B) #3.  Future Appointments  Date Time Provider [place]  09/04/2018 11:15 AM Milinda Pointer, MD [facility] None   Primary Care Physician: Olin Hauser, DO Location: [facility] Note by: Gaspar Cola, MD Date: 08/20/2018; Time: 12:36 PM  Disclaimer:  Medicine is not an exact science. The only guarantee in medicine is that nothing is guaranteed. It is important to note that the decision to proceed with this intervention was based on the information collected from the patient. The Data and conclusions were drawn from the patient's questionnaire, the interview, and the physical examination. Because the information was provided in large part by the patient, it cannot be guaranteed that it has not been purposely or unconsciously manipulated. Every effort has been made to obtain as much relevant data as possible for this evaluation. It is important to note that the conclusions that lead to this procedure are derived in large part from  the available data. Always take into account that the treatment will  also be dependent on availability of resources and existing treatment guidelines, considered by other Pain Management Practitioners as being common knowledge and practice, at the time of the intervention. For Medico-Legal purposes, it is also important to point out that variation in procedural techniques and pharmacological choices are the acceptable norm. The indications, contraindications, technique, and results of the above procedure should only be interpreted and judged by a Board-Certified Interventional Pain Specialist with extensive familiarity and expertise in the same exact procedure and technique.

## 2018-08-21 ENCOUNTER — Telehealth: Payer: Self-pay

## 2018-08-21 NOTE — Telephone Encounter (Signed)
Left message on answering machine to call if needed.

## 2018-09-01 NOTE — Progress Notes (Signed)
Patient's Name: Aaron Gibbs  MRN: [id number]  Referring Provider: Nobie Gibbs *  DOB: 06/26/1934  PCP: Aaron Hauser, DO  DOS: 09/04/2018  Note by: Aaron Cola, MD  Service setting: Ambulatory outpatient  Specialty: Interventional Pain Management  Patient type: Established  Location: [facility] (AMB) Pain Management Facility  Visit type: Interventional Procedure   Primary Reason for Visit: Interventional Pain Management Treatment. CC: Knee Pain  Procedure:          Anesthesia, Analgesia, Anxiolysis:  Type: Therapeutic Intra-Articular Hyalgan Knee Injection #3  Region: Lateral infrapatellar Knee Region Level: Knee Joint Laterality: Bilateral  Type: Local Anesthesia Indication(s): Analgesia         Local Anesthetic: Lidocaine 1-2% Route: Infiltration ([place]/IM) IV Access: Declined Sedation: Declined   Position: Sitting   Indications: 1. Osteoarthritis of knee (Bilateral)   2. Tricompartment osteoarthritis of knee (Left)   3. Osteoarthritis of patellofemoral joint (Right)   4. Chronic knee pain (Primary Area of Pain) (Bilateral) (L>R)    Pain Score: Pre-procedure: 6 /10 Post-procedure: 6 /10  Post-Procedure Assessment  08/20/2018 Procedure: Therapeutic bilateral intra-articular Hyalgan knee injection #2, no fluoroscopy or IV sedation Pre-procedure pain score:  7/10 Post-procedure pain score: 5/10 (< 50% relief) Influential Factors: BMI: 33.00 kg/m Intra-procedural challenges: None observed.         Assessment challenges: None detected.              Reported side-effects: None.        Post-procedural adverse reactions or complications: None reported         Sedation: No sedation used. When no sedatives are used, the analgesic levels obtained are directly associated to the effectiveness of the local anesthetics. However, when sedation is provided, the level of analgesia obtained during the initial 1 hour following the intervention, is believed to be the  result of a combination of factors. These factors may include, but are not limited to: 1. The effectiveness of the local anesthetics used. 2. The effects of the analgesic(s) and/or anxiolytic(s) used. 3. The degree of discomfort experienced by the patient at the time of the procedure. 4. The patients ability and reliability in recalling and recording the events. 5. The presence and influence of possible secondary gains and/or psychosocial factors. Reported result: Relief experienced during the 1st hour after the procedure: 100 % (Ultra-Short Term Relief)            Interpretative annotation: Clinically appropriate result. Analgesia during this period is likely to be Local Anesthetic and/or IV Sedative (Analgesic/Anxiolytic) related.          Effects of local anesthetic: The analgesic effects attained during this period are directly associated to the localized infiltration of local anesthetics and therefore cary significant diagnostic value as to the etiological location, or anatomical origin, of the pain. Expected duration of relief is directly dependent on the pharmacodynamics of the local anesthetic used. Long-acting (4-6 hours) anesthetics used.  Reported result: Relief during the next 4 to 6 hour after the procedure: 100 % (Short-Term Relief)            Interpretative annotation: Clinically appropriate result. Analgesia during this period is likely to be Local Anesthetic-related.          Long-term benefit: Defined as the period of time past the expected duration of local anesthetics (1 hour for short-acting and 4-6 hours for long-acting). With the possible exception of prolonged sympathetic blockade from the local anesthetics, benefits during this period are typically attributed to, or  associated with, other factors such as analgesic sensory neuropraxia, antiinflammatory effects, or beneficial biochemical changes provided by agents other than the local anesthetics.  Reported result: Extended  relief following procedure: 75 %(lasted 3 days) (Long-Term Relief)            Interpretative annotation: Clinically possible results. Good relief. No permanent benefit expected. Inflammation plays a part in the etiology to the pain.          Current benefits: Defined as reported results that persistent at this point in time.   Analgesia: 50 % Aaron Gibbs reports improvement of arthralgia. Function: Somewhat improved ROM: Somewhat improved Interpretative annotation: Ongoing benefit. Therapeutic benefit observed. Effective therapeutic approach.          Interpretation: Results would suggest a successful diagnostic intervention.                  Plan:  Proceed with treatment No.: 3 today.          Pre-op Assessment:  Aaron Gibbs is a 84 y.o. (year old), male patient, seen today for interventional treatment. He  has a past surgical history that includes Cholecystectomy and Transurethral resection of prostate (N/A, 2008). Aaron Gibbs has a current medication list which includes the following prescription(s): amlodipine, aspirin ec, diclofenac sodium, duloxetine, fenofibrate, ferrous sulfate, multivitamin with minerals, pantoprazole, polyethylene glycol, prednisolone acetate, pregabalin, quetiapine, senna, timolol, umeclidinium-vilanterol, doxycycline, meloxicam, and oxycodone. His primarily concern today is the Knee Pain  Initial Vital Signs:  Pulse/HCG Rate: 80  Temp: 98.5 F (36.9 C) Resp:   BP: (!) 144/74 SpO2: 97 %  BMI: Estimated body mass index is 33 kg/m as calculated from the following:   Height as of this encounter: 5\' 10"  (1.778 m).   Weight as of this encounter: 230 lb (104.3 kg).  Risk Assessment: Allergies: Reviewed. He is allergic to azithromycin.  Allergy Precautions: None required Coagulopathies: Reviewed. None identified.  Blood-thinner therapy: None at this time Active Infection(s): Reviewed. None identified. Aaron Gibbs is afebrile  Site Confirmation: Mr.  Gibbs was asked to confirm the procedure and laterality before marking the site Procedure checklist: Completed Consent: Before the procedure and under the influence of no sedative(s), amnesic(s), or anxiolytics, the patient was informed of the treatment options, risks and possible complications. To fulfill our ethical and legal obligations, as recommended by the American Medical Association's Code of Ethics, I have informed the patient of my clinical impression; the nature and purpose of the treatment or procedure; the risks, benefits, and possible complications of the intervention; the alternatives, including doing nothing; the risk(s) and benefit(s) of the alternative treatment(s) or procedure(s); and the risk(s) and benefit(s) of doing nothing. The patient was provided information about the general risks and possible complications associated with the procedure. These may include, but are not limited to: failure to achieve desired goals, infection, bleeding, organ or nerve damage, allergic reactions, paralysis, and death. In addition, the patient was informed of those risks and complications associated to the procedure, such as failure to decrease pain; infection; bleeding; organ or nerve damage with subsequent damage to sensory, motor, and/or autonomic systems, resulting in permanent pain, numbness, and/or weakness of one or several areas of the body; allergic reactions; (i.e.: anaphylactic reaction); and/or death. Furthermore, the patient was informed of those risks and complications associated with the medications. These include, but are not limited to: allergic reactions (i.e.: anaphylactic or anaphylactoid reaction(s)); adrenal axis suppression; blood sugar elevation that in diabetics may result in ketoacidosis or comma; water  retention that in patients with history of congestive heart failure may result in shortness of breath, pulmonary edema, and decompensation with resultant heart failure; weight  gain; swelling or edema; medication-induced neural toxicity; particulate matter embolism and blood vessel occlusion with resultant organ, and/or nervous system infarction; and/or aseptic necrosis of one or more joints. Finally, the patient was informed that Medicine is not an exact science; therefore, there is also the possibility of unforeseen or unpredictable risks and/or possible complications that may result in a catastrophic outcome. The patient indicated having understood very clearly. We have given the patient no guarantees and we have made no promises. Enough time was given to the patient to ask questions, all of which were answered to the patient's satisfaction. Mr. Curci has indicated that he wanted to continue with the procedure. Attestation: I, the ordering provider, attest that I have discussed with the patient the benefits, risks, side-effects, alternatives, likelihood of achieving goals, and potential problems during recovery for the procedure that I have provided informed consent. Date  Time: 09/04/2018 11:12 AM  Pre-Procedure Preparation:  Monitoring: As per clinic protocol. Respiration, ETCO2, SpO2, BP, heart rate and rhythm monitor placed and checked for adequate function Safety Precautions: Patient was assessed for positional comfort and pressure points before starting the procedure. Time-out: I initiated and conducted the "Time-out" before starting the procedure, as per protocol. The patient was asked to participate by confirming the accuracy of the "Time Out" information. Verification of the correct person, site, and procedure were performed and confirmed by me, the nursing staff, and the patient. "Time-out" conducted as per Joint Commission's Universal Protocol (UP.01.01.01). Time: 1235  Description of Procedure:          Target Area: Knee Joint Approach: Just above the Lateral tibial plateau, lateral to the infrapatellar tendon. Area Prepped: Entire knee area, from the  mid-thigh to the mid-shin. Prepping solution: ChloraPrep (2% chlorhexidine gluconate and 70% isopropyl alcohol) Safety Precautions: Aspiration looking for blood return was conducted prior to all injections. At no point did we inject any substances, as a needle was being advanced. No attempts were made at seeking any paresthesias. Safe injection practices and needle disposal techniques used. Medications properly checked for expiration dates. SDV (single dose vial) medications used. Description of the Procedure: Protocol guidelines were followed. The patient was placed in position over the fluoroscopy table. The target area was identified and the area prepped in the usual manner. Skin & deeper tissues infiltrated with local anesthetic. Appropriate amount of time allowed to pass for local anesthetics to take effect. The procedure needles were then advanced to the target area. Proper needle placement secured. Negative aspiration confirmed. Solution injected in intermittent fashion, asking for systemic symptoms every 0.5cc of injectate. The needles were then removed and the area cleansed, making sure to leave some of the prepping solution back to take advantage of its long term bactericidal properties. Vitals:   09/04/18 1108 09/04/18 1245  BP: (!) 144/74 140/72  Pulse: 80   Temp: 98.5 F (36.9 C)   SpO2: 97%   Weight: 230 lb (104.3 kg)   Height: 5\' 10"  (1.778 m)     Start Time: 1236 hrs. End Time: 1239 hrs. Materials:  Needle(s) Type: Regular needle Gauge: 25G Length: 1.5-in Medication(s): Please see orders for medications and dosing details.  Imaging Guidance:          Type of Imaging Technique: None used Indication(s): N/A Exposure Time: No patient exposure Contrast: None used. Fluoroscopic Guidance: N/A Ultrasound Guidance:  N/A Interpretation: N/A  Antibiotic Prophylaxis:   Anti-infectives (From admission, onward)   None     Indication(s): None identified  Post-operative  Assessment:  Post-procedure Vital Signs:  Pulse/HCG Rate: 80  Temp: 98.5 F (36.9 C) Resp:   BP: 140/72 SpO2: 97 %  EBL: None  Complications: No immediate post-treatment complications observed by team, or reported by patient.  Note: The patient tolerated the entire procedure well. A repeat set of vitals were taken after the procedure and the patient was kept under observation following institutional policy, for this type of procedure. Post-procedural neurological assessment was performed, showing return to baseline, prior to discharge. The patient was provided with post-procedure discharge instructions, including a section on how to identify potential problems. Should any problems arise concerning this procedure, the patient was given instructions to immediately contact us, at any time, without hesitation. In any case, we plan to contact the patient by telephone for a follow-up status report regarding this interventional procedure.  Comments:  No additional relevant information.  Plan of Care  Orders:  Orders Placed This Encounter  Procedures  . KNEE INJECTION    Hyalgan knee injection to be done by MD.    Scheduling Instructions:     Procedure: Intra-articular Hyalgan Knee injection #3     Side(s): Bilateral Knee     Sedation: None     Timeframe: Today    Order Specific Question:   Where will this procedure be performed?    Answer:   [facility]  . KNEE INJECTION    Hyalgan knee injection. Please order Hyalgan.    Standing Status:   Future    Standing Expiration Date:   10/04/2018    Scheduling Instructions:     Procedure: Intra-articular Hyalgan Knee injection #4     Side: Bilateral     Sedation: None     Timeframe: in two (2) weeks    Order Specific Question:   Where will this procedure be performed?    Answer:   [facility]  . Compliance Drug Analysis, Ur    Volume: 30 ml(s). Minimum 3 ml of urine is needed. Document temperature of fresh  sample. Indications: Long term (current) use of opiate analgesic (Z79.891) Test#: 110315 (Comprehensive Profile)  . Provider attestation of informed consent for procedure/surgical case    I, the ordering provider, attest that I have discussed with the patient the benefits, risks, side effects, alternatives, likelihood of achieving goals and potential problems during recovery for the procedure that I have provided informed consent.  . Informed Consent Details: Transcribe to consent form and obtain patient signature    Consent Attestation: I, the ordering provider, attest that I have discussed with the patient the benefits, risks, side-effects, alternatives, likelihood of achieving goals, and potential problems during recovery for the procedure that I have provided informed consent.    Scheduling Instructions:     Procedure: Therapeutic, bilateral, intra-articular Hyalgan knee injection     Surgeon: Kannon Granderson A. Dossie Arbour, MD     Indications: Chronic bilateral knee pain secondary to primary osteoarthritis of the knee  . Nursing Instructions:    Scheduling Instructions:     1. Medication Agreement: Please go over agreement with the patient. Have the patient read and sign the agreement. Provide patient with a copy of the signed agreement.     2. Make sure that the patient has completed the ORT (Opioid Risk Tool).     3. Provide the patient with a copy of our "  Medicatiion Policy", "Medication Recommendations and Reminders", and "CBD information".     4. Remind the patient to always bring their medications and medication bottles (even if empty) to all appointments except for procedure appointments.  . Nursing communication    Scheduling Instructions:     Complete the opioid risk tool (ORT) questionnaire.   Medications ordered for procedure: Meds ordered this encounter  Medications  . lidocaine (PF) (XYLOCAINE) 1 % injection 5 mL  . ropivacaine (PF) 2 mg/mL (0.2%) (NAROPIN) injection 5 mL  . Sodium  Hyaluronate SOSY 2 mL  . Sodium Hyaluronate SOSY 2 mL  . meloxicam (MOBIC) 15 MG tablet    Sig: Take 1 tablet (15 mg total) by mouth daily.    Dispense:  30 tablet    Refill:  2    Do not add this medication to the electronic "Automatic Refill" notification system. Patient may have prescription filled one day early if pharmacy is closed on scheduled refill date.  Marland Kitchen oxyCODONE (OXY IR/ROXICODONE) 5 MG immediate release tablet    Sig: Take 1 tablet (5 mg total) by mouth every 6 (six) hours as needed for up to 30 days for severe pain. Must last 30 days.    Dispense:  120 tablet    Refill:  0     STOP ACT - Not applicable to Chronic Pain Syndrome (G89.4) diagnosis. Fill one day early if pharmacy is closed on scheduled refill date. Do not fill until: 09/04/18. To last until: 10/04/18.   Medications administered: We administered lidocaine (PF), ropivacaine (PF) 2 mg/mL (0.2%), Sodium Hyaluronate, and Sodium Hyaluronate.  See the medical record for exact dosing, route, and time of administration.  Disposition: Discharge home  Discharge Date & Time: 09/04/2018; 1244 hrs.   Follow-up plan:   Return for Procedure + EPP (2 wks) (no sedation): (B) Hyalgan #4.     Future Appointments  Date Time Provider [place]  09/19/2018 10:15 AM Milinda Pointer, MD [facility] None   Primary Care Physician: Aaron Hauser, DO Location: [facility] Facility Note by: Aaron Cola, MD Date: 09/04/2018; Time: 4:06 PM  Disclaimer:  Medicine is not an Chief Strategy Officer. The only guarantee in medicine is that nothing is guaranteed. It is important to note that the decision to proceed with this intervention was based on the information collected from the patient. The Data and conclusions were drawn from the patient's questionnaire, the interview, and the physical examination. Because the information was provided in large part by the patient, it cannot be guaranteed that it has  not been purposely or unconsciously manipulated. Every effort has been made to obtain as much relevant data as possible for this evaluation. It is important to note that the conclusions that lead to this procedure are derived in large part from the available data. Always take into account that the treatment will also be dependent on availability of resources and existing treatment guidelines, considered by other Pain Management Practitioners as being common knowledge and practice, at the time of the intervention. For Medico-Legal purposes, it is also important to point out that variation in procedural techniques and pharmacological choices are the acceptable norm. The indications, contraindications, technique, and results of the above procedure should only be interpreted and judged by a Board-Certified Interventional Pain Specialist with extensive familiarity and expertise in the same exact procedure and technique.

## 2018-09-03 NOTE — Patient Instructions (Addendum)

## 2018-09-04 ENCOUNTER — Telehealth: Payer: Self-pay | Admitting: Family Medicine

## 2018-09-04 ENCOUNTER — Ambulatory Visit: Payer: Medicare Other | Attending: Pain Medicine | Admitting: Pain Medicine

## 2018-09-04 ENCOUNTER — Other Ambulatory Visit: Payer: Self-pay

## 2018-09-04 ENCOUNTER — Encounter: Payer: Self-pay | Admitting: Pain Medicine

## 2018-09-04 VITALS — BP 140/72 | HR 80 | Temp 98.5°F | Ht 70.0 in | Wt 230.0 lb

## 2018-09-04 DIAGNOSIS — M25561 Pain in right knee: Secondary | ICD-10-CM | POA: Diagnosis present

## 2018-09-04 DIAGNOSIS — M170 Bilateral primary osteoarthritis of knee: Secondary | ICD-10-CM | POA: Diagnosis not present

## 2018-09-04 DIAGNOSIS — M1712 Unilateral primary osteoarthritis, left knee: Secondary | ICD-10-CM

## 2018-09-04 DIAGNOSIS — R45851 Suicidal ideations: Secondary | ICD-10-CM

## 2018-09-04 DIAGNOSIS — G8929 Other chronic pain: Secondary | ICD-10-CM | POA: Diagnosis present

## 2018-09-04 DIAGNOSIS — Z79899 Other long term (current) drug therapy: Secondary | ICD-10-CM | POA: Diagnosis present

## 2018-09-04 DIAGNOSIS — M1711 Unilateral primary osteoarthritis, right knee: Secondary | ICD-10-CM | POA: Diagnosis not present

## 2018-09-04 DIAGNOSIS — T1491XA Suicide attempt, initial encounter: Secondary | ICD-10-CM | POA: Diagnosis present

## 2018-09-04 DIAGNOSIS — M150 Primary generalized (osteo)arthritis: Secondary | ICD-10-CM | POA: Diagnosis present

## 2018-09-04 DIAGNOSIS — M159 Polyosteoarthritis, unspecified: Secondary | ICD-10-CM

## 2018-09-04 DIAGNOSIS — G894 Chronic pain syndrome: Secondary | ICD-10-CM | POA: Diagnosis present

## 2018-09-04 DIAGNOSIS — Z915 Personal history of self-harm: Secondary | ICD-10-CM | POA: Insufficient documentation

## 2018-09-04 DIAGNOSIS — M25562 Pain in left knee: Secondary | ICD-10-CM

## 2018-09-04 DIAGNOSIS — Z9151 Personal history of suicidal behavior: Secondary | ICD-10-CM

## 2018-09-04 MED ORDER — SODIUM HYALURONATE (VISCOSUP) 20 MG/2ML IX SOSY
2.0000 mL | PREFILLED_SYRINGE | Freq: Once | INTRA_ARTICULAR | Status: AC
  Administered 2018-09-04: 13:00:00 via INTRA_ARTICULAR

## 2018-09-04 MED ORDER — LIDOCAINE HCL (PF) 1 % IJ SOLN
5.0000 mL | Freq: Once | INTRAMUSCULAR | Status: AC
  Administered 2018-09-04: 5 mL
  Filled 2018-09-04: qty 5

## 2018-09-04 MED ORDER — ROPIVACAINE HCL 2 MG/ML IJ SOLN
5.0000 mL | Freq: Once | INTRAMUSCULAR | Status: AC
  Administered 2018-09-04: 10 mL via INTRA_ARTICULAR
  Filled 2018-09-04: qty 10

## 2018-09-04 MED ORDER — OXYCODONE HCL 5 MG PO TABS: 5.0000 mg | ORAL_TABLET | Freq: Four times a day (QID) | ORAL | 0 refills | Status: DC | PRN

## 2018-09-04 MED ORDER — MELOXICAM 15 MG PO TABS: 15.0000 mg | ORAL_TABLET | Freq: Every day | ORAL | 2 refills | Status: DC

## 2018-09-04 NOTE — Telephone Encounter (Signed)
Pt asked which optometrist Dr. Raliegh Ip would recommend for some inflamation in left eye.  His call back number is [phone]

## 2018-09-04 NOTE — Progress Notes (Signed)
Nursing Pain Medication Assessment:  Safety precautions to be maintained throughout the outpatient stay will include: orient to surroundings, keep bed in low position, maintain call bell within reach at all times, provide assistance with transfer out of bed and ambulation.  Medication Inspection Compliance: Aaron Gibbs did not comply with our request to bring his pills to be counted. He was reminded that bringing the medication bottles, even when empty, is a requirement.  Medication: None brought in. Pill/Patch Count: None available to be counted. Bottle Appearance: No container available. Did not bring bottle(s) to appointment. Filled Date: N/A Last Medication intake:  Day before yesterday

## 2018-09-05 NOTE — Telephone Encounter (Signed)
Left message and information is mailed.

## 2018-09-05 NOTE — Telephone Encounter (Signed)
[facility]   Address: [address] [place], [place] [address] Phone: [phone]  Website: [email or web address] [address] [place], [place] [address] Phone: [phone] [email or web address]  [facility]  Address: [place], [place], [place] [address] Phone: [phone]   [name], [employer] 09/05/2018, 9:30 AM

## 2018-09-17 ENCOUNTER — Other Ambulatory Visit: Payer: Self-pay

## 2018-09-17 ENCOUNTER — Encounter: Payer: Self-pay | Admitting: Family Medicine

## 2018-09-17 ENCOUNTER — Ambulatory Visit (INDEPENDENT_AMBULATORY_CARE_PROVIDER_SITE_OTHER): Payer: Medicare Other | Admitting: Family Medicine

## 2018-09-17 DIAGNOSIS — J441 Chronic obstructive pulmonary disease with (acute) exacerbation: Secondary | ICD-10-CM | POA: Diagnosis not present

## 2018-09-17 DIAGNOSIS — J432 Centrilobular emphysema: Secondary | ICD-10-CM

## 2018-09-17 MED ORDER — LEVOFLOXACIN 500 MG PO TABS: 500.0000 mg | ORAL_TABLET | Freq: Every day | ORAL | 0 refills | Status: DC

## 2018-09-17 MED ORDER — PREDNISONE 50 MG PO TABS: 50.0000 mg | ORAL_TABLET | Freq: Every day | ORAL | 0 refills | Status: DC

## 2018-09-17 NOTE — Progress Notes (Signed)
Virtual Visit via Telephone The purpose of this virtual visit is to provide medical care while limiting exposure to the novel coronavirus (COVID19) for both patient and office staff.  Consent was obtained for phone visit:  Yes.   Answered questions that patient had about telehealth interaction:  Yes.   I discussed the limitations, risks, security and privacy concerns of performing an evaluation and management service by telephone. I also discussed with the patient that there may be a patient responsible charge related to this service. The patient expressed understanding and agreed to proceed.  Patient Location: Home Provider Location: [facility])   ---------------------------------------------------------------------- CC: Cough, URI, Dyspnea  Chief Complaint  Patient presents with  . Cold Exposure    SOB, dry cough denies fever onset 3 days asking for Abx    S: Reviewed CMA telephone note below. I have called patient and gathered additional HPI as follows:  Centrilobular Emphysema / COPD EXACERBATION Reports that symptoms started about 3 days ago with cough dry non productive, describes persistent shortness of breath most of the time now for past few days. - Taking Anoro - He takes chronic doxycycline for 10 days out of each month due to chronic pulmonary concerns, he usually takes a course of Levaquin for this type of episode as advised by his pulmonologist  Patient currently isolation Denies any high risk travel to areas of current concern for [place]. Denies any known or suspected exposure to person with or possibly with COVID19.  Denies any fevers, chills, sweats, body ache, cough, shortness of breath, sinus pain or pressure, headache, abdominal pain, diarrhea  -------------------------------------------------------------------------- O: No physical exam performed due to remote telephone  encounter.  -------------------------------------------------------------------------- A&P:   Suspected Acute COPD Exacerbation possible viral or allergic onset trigger, now worsening similar to prior COPD flare - Reassuring without high risk symptoms - Afebrile but does have dyspnea - Comorbid pulmonary condition with COPD  - Currently patient is LOW RISK for COVID19 based on current symptoms and no known travel/exposure - however at this time, now [place] is currently within phase of community spread, and therefore patient can still be potentially exposed. Cannot rule out case with mild symptoms. Testing is not recommended at this time due to mild symptoms.  1. Start taking Levaquin antibiotic 500mg  daily x 7 days 2. Start Prednisone 50mg  daily for 5 days 3. Continue Anoro, Albuterol 4. Should hold his chronic doxycycline while on Levaquin  Meds ordered this encounter  Medications  . predniSONE (DELTASONE) 50 MG tablet    Sig: Take 1 tablet (50 mg total) by mouth daily with breakfast.    Dispense:  5 tablet    Refill:  0  . levofloxacin (LEVAQUIN) 500 MG tablet    Sig: Take 1 tablet (500 mg total) by mouth daily. For 7 days    Dispense:  7 tablet    Refill:  0    OPTIONAL RECOMMENDED self quarantine for patient safety for PREVENTION ONLY. It is not required based on current clinical symptoms. If they were to develop fever or worsening shortness of breath, then emphasis on REQUIRED quarantine for up to 7-14 days that could be resolved if fever free >3 days AND if symptoms improving after 7 days.   If symptoms do not resolve or significantly improve OR if WORSENING - fever / cough - or worsening shortness of breath - then should contact us and seek advice on next steps in treatment at home vs where/when to seek care at  Urgent Care or Hospital ED for further intervention and possible testing if indicated.  Patient verbalizes understanding with the above medical recommendations  including the limitation of remote medical advice.  Specific follow-up / call-back criteria were given for patient to follow-up or seek medical care more urgently if needed.   - Time spent in direct consultation with patient on phone: 11 minutes   [name], [employer] [facility] 09/17/2018, 9:05 AM

## 2018-09-17 NOTE — Patient Instructions (Signed)
1. It sounds like you had an Upper Respiratory Virus that has settled into a Bronchitis, lower respiratory tract infection. I don't have concerns for pneumonia today, and think that this should gradually improve. Once you are feeling better, the cough may take a few weeks to fully resolve. I do hear wheezing and coarse breath sounds, this may be due to the virus, also could be related to smoking.  Start taking Levaquin antibiotic 500mg  daily x 7 days  Start Prednisone 50mg  daily for next 5 days - this will open up lungs allow you to breath better and treat that wheezing or bronchospasm - Use Albuterol inhaler 2 puffs every 4-6 hours around the clock for next 2-3 days, max up to 5 days then use as needed   - Use nasal saline (Simply Saline or Ocean Spray) to flush nasal congestion multiple times a day, may help cough - Drink plenty of fluids to improve congestion  If your symptoms seem to worsen instead of improve over next several days, including significant fever / chills, worsening shortness of breath, worsening wheezing, or nausea / vomiting and can't take medicines - return sooner or go to hospital Emergency Department for more immediate treatment.   If you have any other questions or concerns, please feel free to call the office or send a message through [place]. You may also schedule an earlier appointment if necessary.  Additionally, you may be receiving a survey about your experience at our office within a few days to 1 week by e-mail or mail. We value your feedback.  [name], DO [place]

## 2018-09-19 ENCOUNTER — Ambulatory Visit: Payer: Self-pay | Admitting: Pain Medicine

## 2018-09-20 ENCOUNTER — Encounter: Payer: Self-pay | Admitting: Family Medicine

## 2018-09-20 ENCOUNTER — Ambulatory Visit (INDEPENDENT_AMBULATORY_CARE_PROVIDER_SITE_OTHER): Payer: Medicare Other | Admitting: Family Medicine

## 2018-09-20 ENCOUNTER — Other Ambulatory Visit: Payer: Self-pay

## 2018-09-20 DIAGNOSIS — J441 Chronic obstructive pulmonary disease with (acute) exacerbation: Secondary | ICD-10-CM

## 2018-09-20 MED ORDER — PREDNISONE 20 MG PO TABS: ORAL_TABLET | ORAL | 0 refills | Status: DC

## 2018-09-20 NOTE — Progress Notes (Signed)
Virtual Visit via Telephone The purpose of this virtual visit is to provide medical care while limiting exposure to the novel coronavirus (COVID19) for both patient and office staff.  Consent was obtained for phone visit:  Yes.   Answered questions that patient had about telehealth interaction:  Yes.   I discussed the limitations, risks, security and privacy concerns of performing an evaluation and management service by telephone. I also discussed with the patient that there may be a patient responsible charge related to this service. The patient expressed understanding and agreed to proceed.  Patient Location: Home Provider Location: [facility])   ---------------------------------------------------------------------- Chief Complaint  Patient presents with  . Cough    COPD, URI follow up last dose of Abx asking for more    S: Reviewed CMA telephone note below. I have called patient and gathered additional HPI as follows:  FOLLOW-UP Centrilobular Emphysema / COPD EXACERBATION - Last visit with me virtual visit on 09/17/18 - 3 days ago for initial visit for same problem COPD, treated with Levaquin 500mg  daily x 7 days and Prednisone burst 50mg  daily x 5 days, see prior notes for background information. - Interval update with improvement in his breathing - Today patient reports still has productive cough and some shortness of breath, but no fever or new concerns, he feels improved and better but not resolved. He had taken HALF levaquin pill 1-2 times a day, he is asking for refill on levaquin but seems to have fewer pills than he should. He is almost out of prednisone. - Taking Anoro - He takes chronic doxycycline for 10 days out of each month due to chronic pulmonary concerns - has not taken recently  Patient currently in isolation Denies any high risk travel to areas of current concern for COVID19. Denies any known or suspected exposure to person with or possibly  with COVID19.  Denies any fevers, chills, sweats, body ache, sinus pain or pressure, headache, abdominal pain, diarrhea  -------------------------------------------------------------------------- O: No physical exam performed due to remote telephone encounter.  -------------------------------------------------------------------------- A&P:   IMPROVING Acute COPD Exacerbation On treatment - Reassuring without high risk symptoms - Afebrile but does have dyspnea - Comorbid pulmonary condition with COPD  - Currently patient is LOW RISK for COVID19 based on current symptoms and no known travel/exposure - however at this time, now [place] is currently within phase of community spread, and therefore patient can still be potentially exposed. Cannot rule out case with mild symptoms. Testing is not recommended at this time due to mild symptoms.  1. FINISH CURRENT Levaquin antibiotic 500mg  daily x 7 days - based on patient reported pill count he had taken extra or missing pill - he should finish current rx - no refill at this time. 2. Agree to EXTEND prednisone course sent new taper now for 7 days 3. Continue Anoro, Albuterol 4. Should hold his chronic doxycycline while on Levaquin  Meds ordered this encounter  Medications  . predniSONE (DELTASONE) 20 MG tablet    Sig: Take daily with food. Start with 60mg  (3 pills) x 2 days, then reduce to 40mg  (2 pills) x 2 days, then 20mg  (1 pill) x 3 days    Dispense:  13 tablet    Refill:  0    Extended course   OPTIONAL RECOMMENDED self quarantine for patient safety for PREVENTION ONLY. It is not required based on current clinical symptoms. If they were to develop fever or worsening shortness of breath, then emphasis on  REQUIRED quarantine for up to 7-14 days that could be resolved if fever free >3 days AND if symptoms improving after 7 days.   If symptoms do not resolve or significantly improve OR if WORSENING - fever / cough - or worsening shortness of  breath - then should contact us and seek advice on next steps in treatment at home vs where/when to seek care at Urgent Care or Hospital ED for further intervention and possible testing if indicated.  Patient verbalizes understanding with the above medical recommendations including the limitation of remote medical advice.  Specific follow-up / call-back criteria were given for patient to follow-up or seek medical care more urgently if needed.   - Time spent in direct consultation with patient on phone: 7 minutes  [name], [facility] 09/20/2018, 9:42 AM

## 2018-09-20 NOTE — Patient Instructions (Signed)
AVS given to patient via phone instructions

## 2018-09-24 ENCOUNTER — Ambulatory Visit: Payer: Self-pay | Admitting: Pain Medicine

## 2018-09-25 ENCOUNTER — Other Ambulatory Visit: Payer: Self-pay

## 2018-09-25 ENCOUNTER — Ambulatory Visit: Payer: Medicare Other | Attending: Pain Medicine | Admitting: Pain Medicine

## 2018-09-25 DIAGNOSIS — M1712 Unilateral primary osteoarthritis, left knee: Secondary | ICD-10-CM

## 2018-09-25 DIAGNOSIS — M545 Low back pain, unspecified: Secondary | ICD-10-CM

## 2018-09-25 DIAGNOSIS — M25561 Pain in right knee: Secondary | ICD-10-CM | POA: Diagnosis not present

## 2018-09-25 DIAGNOSIS — M4810 Ankylosing hyperostosis [Forestier], site unspecified: Secondary | ICD-10-CM

## 2018-09-25 DIAGNOSIS — G894 Chronic pain syndrome: Secondary | ICD-10-CM

## 2018-09-25 DIAGNOSIS — M1711 Unilateral primary osteoarthritis, right knee: Secondary | ICD-10-CM

## 2018-09-25 DIAGNOSIS — M25562 Pain in left knee: Secondary | ICD-10-CM

## 2018-09-25 DIAGNOSIS — G8929 Other chronic pain: Secondary | ICD-10-CM

## 2018-09-25 MED ORDER — OXYCODONE HCL 5 MG PO TABS: 5.0000 mg | ORAL_TABLET | Freq: Four times a day (QID) | ORAL | 0 refills | Status: DC | PRN

## 2018-09-25 NOTE — Progress Notes (Signed)
Pain Management Encounter Note - Virtual Visit via Telephone Telehealth (real-time audio visits between healthcare provider and patient).  Patient's Phone No. & Preferred Pharmacy:  [phone] (home); [phone] (mobile); (Preferred) [facility], [address] [place] [address] Phone: [phone] Fax: [phone]   Pre-screening note:  Our staff contacted Aaron Gibbs and offered him an "in person", "face-to-face" appointment versus a telephone encounter. He indicated preferring the telephone encounter, at this time.  Reason for Virtual Visit: COVID-19*  Social distancing based on CDC and AMA recommendations.   I contacted Aaron Gibbs on 09/25/2018 at 11:32 AM by telephone and clearly identified myself as Gaspar Cola, MD. I verified that I was speaking with the correct person using two identifiers (Name and date of birth: May 23, 1935).  Advanced Informed Consent I sought verbal advanced consent from Aaron Gibbs for telemedicine interactions and virtual visit. I informed Aaron Gibbs of the security and privacy concerns, risks, and limitations associated with performing an evaluation and management service by telephone. I also informed Aaron Gibbs of the availability of "in person" appointments and I informed him of the possibility of a patient responsible charge related to this service. Aaron Gibbs expressed understanding and agreed to proceed.   Historic Elements   Aaron Gibbs is a 84 y.o. year old, male patient evaluated today after his last encounter by our practice on 09/04/2018. Aaron Gibbs  has a past medical history of Anxiety, Cancer ([place]), COPD (chronic obstructive pulmonary disease) ([place]), Glaucoma, and Osteoporosis. He also  has a past surgical history that includes Cholecystectomy and Transurethral resection of prostate (N/A, 2008). Aaron Gibbs has a current medication list which includes the  following prescription(s): amlodipine, aspirin ec, diclofenac sodium, doxycycline, duloxetine, fenofibrate, ferrous sulfate, levofloxacin, meloxicam, multivitamin with minerals, oxycodone, pantoprazole, polyethylene glycol, prednisolone acetate, prednisone, pregabalin, quetiapine, senna, timolol, and umeclidinium-vilanterol. He  reports that he has quit smoking. He quit after 15.00 years of use. His smokeless tobacco use includes chew. He reports previous alcohol use. He reports that he does not use drugs. Aaron Gibbs is allergic to azithromycin.   HPI  I last saw him on 09/04/2018. He is being evaluated for both, medication management and a post-procedure assessment.  The patient indicates doing incredibly well with the Hyalgan knee injections as well as a combination of medications that we put him on.  He actually seem to be very happy and he seems to have a new lease on life.  It is difficult to believe that less than a year ago this patient had attempted to commit suicide.  With his current pain management he seems to be doing very well and it is happy with the results.  He is looking forward to completing the series of 5 intra-articular Hyalgan injections.  He refers having absolutely no side effects to any of the medications and he indicates having been able to increase his level of activity.  He denies any type of side effects or complications.  Post-Procedure Evaluation  Procedure: Therapeutic bilateral intra-articular Hyalgan knee injection #3, without fluoroscopy or IV sedation Pre-procedure pain level:  6/10 Post-procedure: 6/10 No initial benefit, possibly due to rapid discharge after no sedation procedure, without enough time to allow full onset of block.  Sedation: None.  Effectiveness during initial hour after procedure(Ultra-Short Term Relief): 100 %  Local anesthetic used: Long-acting (4-6 hours) Effectiveness: Defined as any analgesic benefit obtained secondary to the administration of  local anesthetics. This  carries significant diagnostic value as to the etiological location, or anatomical origin, of the pain. Duration of benefit is expected to coincide with the duration of the local anesthetic used.  Effectiveness during initial 4-6 hours after procedure(Short-Term Relief): 100 %  Long-term benefit: Defined as any relief past the pharmacologic duration of the local anesthetics.  Effectiveness past the initial 6 hours after procedure(Long-Term Relief): 100 %  Current benefits: Defined as benefit that persist at this time.   Analgesia:  90-100% better Function: Aaron Gibbs reports improvement in function ROM: Aaron Gibbs reports improvement in ROM  Pharmacotherapy Assessment  Analgesic: Oxycodone IR 5 mg every 6 hours (20 mg/day of oxycodone) MME/day: 30 mg/day.   Monitoring: Pharmacotherapy: No side-effects or adverse reactions reported. [place] PMP: PDMP reviewed during this encounter.       Compliance: No problems identified. Plan: Refer to "POC".  Review of recent tests  MR KNEE RIGHT WO CONTRAST CLINICAL DATA:  Bilateral knee pain for 10 years. No known injury. History of ankylosing spondylitis.  EXAM: MRI OF THE RIGHT KNEE WITHOUT CONTRAST  TECHNIQUE: Multiplanar, multisequence MR imaging of the knee was performed. No intravenous contrast was administered.  COMPARISON:  Plain films right knee 07/10/2018.  FINDINGS: MENISCI  Medial meniscus:  Intact.  Lateral meniscus: Intact. There is some fraying along the free edge of the body.  LIGAMENTS  Cruciates:  Intact.  Collaterals:  Intact.  CARTILAGE  Patellofemoral:  Normal.  Medial:  Mildly degenerated.  Lateral:  Mildly degenerated.  Joint:  Small effusion.  Popliteal Fossa:  Small Baker's cyst.  Extensor Mechanism: Intact. Small foci of intrasubstance increased T2 signal are seen in the patellar tendon.  Bones:  Normal marrow signal throughout.  Other:  None.  IMPRESSION: Negative for meniscal or ligament tear.  Mild medial and lateral compartment osteoarthritis.  Mild patellar tendinosis.  Electronically Signed   By: Inge Rise M.D.   On: 08/05/2018 13:26 MR KNEE LEFT WO CONTRAST CLINICAL DATA:  Chronic left knee pain. No known injury. History of ankylosing spondylitis.  EXAM: MRI OF THE LEFT KNEE WITHOUT CONTRAST  TECHNIQUE: Multiplanar, multisequence MR imaging of the knee was performed. No intravenous contrast was administered.  COMPARISON:  Plain films left knee 07/10/2018.  FINDINGS: MENISCI  Medial meniscus: Focal longitudinal tear is seen in the central aspect of the posterior horn just behind the free edge.  Lateral meniscus: Horizontal tear throughout the body reaches the meniscal undersurface. The body is degenerated with fraying along its free edge.  LIGAMENTS  Cruciates:  Intact.  Collaterals:  Intact.  CARTILAGE  Patellofemoral:  Preserved.  Medial:  Minimally degenerated.  Lateral:  Minimally degenerated.  Joint:  Small effusion.  Popliteal Fossa:  Small Baker's cyst.  Extensor Mechanism:  Intact.  Bones: 1 cm in diameter enchondroma distal femur incidentally noted. Small degenerative cysts are seen subjacent to the tibial eminences. No fracture or worrisome lesion.  Other: None.  IMPRESSION: Degenerated body of the lateral meniscus with a horizontal tear reaching the meniscal undersurface.  Small longitudinal tear central posterior horn of the medial meniscus.  Mild medial and lateral compartment osteoarthritis.  Electronically Signed   By: Inge Rise M.D.   On: 08/05/2018 12:36   Office Visit on 08/16/2018  Component Date Value Ref Range Status  . Influenza A, POC 08/16/2018 Negative  Negative Final  . Influenza B, POC 08/16/2018 Negative  Negative Final   Assessment  The primary encounter diagnosis was Chronic pain syndrome. Diagnoses of Chronic knee  pain  (Primary Area of Pain) (Bilateral) (L>R), Osteoarthritis of patellofemoral joint (Right), Tricompartment osteoarthritis of knee (Left), Chronic low back pain (Bilateral) w/o sciatica, and DISH (diffuse idiopathic skeletal hyperostosis) were also pertinent to this visit.  Plan of Care  I am having Aaron Gibbs maintain his pantoprazole, amLODipine, umeclidinium-vilanterol, ferrous sulfate, fenofibrate, timolol, aspirin EC, senna, prednisoLONE acetate, multivitamin with minerals, polyethylene glycol, DULoxetine, QUEtiapine, doxycycline, pregabalin, diclofenac sodium, meloxicam, levofloxacin, predniSONE, and oxyCODONE.  Pharmacotherapy (Medications Ordered): Meds ordered this encounter  Medications  . oxyCODONE (OXY IR/ROXICODONE) 5 MG immediate release tablet    Sig: Take 1 tablet (5 mg total) by mouth every 6 (six) hours as needed for up to 30 days for severe pain. Must last 30 days.    Dispense:  120 tablet    Refill:  0    [place] STOP ACT - Not applicable to Chronic Pain Syndrome (G89.4) diagnosis. Fill one day early if pharmacy is closed on scheduled refill date. Do not fill until: 10/04/18. To last until: 11/03/18.   Orders:  No orders of the defined types were placed in this encounter.  Follow-up plan:   Return for Procedure (no sedation): (B) Hyalgan #4, Med-Mgmt, w/ Dr. Dossie Arbour, (Virtual).   Interventional management options: Planned, scheduled, and/or pending:    Therapeutic bilateral intra-articular Hyalgan knee injection #4   Considering:   Diagnostic/therapeutic bilateral intra-articular knee injection with local anesthetic and steroids  Therapeutic, series of 5, bilateral intra-articular Hyalgan knee injections  Diagnostic bilateral genicular nerve block  Possible bilateral genicular nerve RFA    PRN Procedures:   None at this time   I discussed the assessment and treatment plan with the patient. The patient was provided an opportunity to ask questions and all were  answered. The patient agreed with the plan and demonstrated an understanding of the instructions.  Patient advised to call back or seek an in-person evaluation if the symptoms or condition worsens.  Total duration of non-face-to-face encounter: 11 minutes.  Note by: Gaspar Cola, MD Date: 09/25/2018; Time: 11:32 AM  Disclaimer:  * Given the special circumstances of the COVID-19 pandemic, the federal government has announced that the Office for Civil Rights (OCR) will exercise its enforcement discretion and will not impose penalties on physicians using telehealth in the event of noncompliance with regulatory requirements under the [place] and Accountability Act (HIPAA) in connection with the good faith provision of telehealth during the DJMEQ-68 national public health emergency. ([place])

## 2018-09-25 NOTE — Patient Instructions (Signed)
____________________________________________________________________________________________  Preparing for your procedure (without sedation)  Procedure appointments are limited to planned procedures: . No Prescription Refills. . No disability issues will be discussed. . No medication changes will be discussed.  Instructions: . Oral Intake: Do not eat or drink anything for at least 3 hours prior to your procedure. . Transportation: Unless otherwise stated by your physician, you may drive yourself after the procedure. . Blood Pressure Medicine: Take your blood pressure medicine with a sip of water the morning of the procedure. . Blood thinners: Notify our staff if you are taking any blood thinners. Depending on which one you take, there will be specific instructions on how and when to stop it. . Diabetics on insulin: Notify the staff so that you can be scheduled 1st case in the morning. If your diabetes requires high dose insulin, take only  of your normal insulin dose the morning of the procedure and notify the staff that you have done so. . Preventing infections: Shower with an antibacterial soap the morning of your procedure.  . Build-up your immune system: Take 1000 mg of Vitamin C with every meal (3 times a day) the day prior to your procedure. . Antibiotics: Inform the staff if you have a condition or reason that requires you to take antibiotics before dental procedures. . Pregnancy: If you are pregnant, call and cancel the procedure. . Sickness: If you have a cold, fever, or any active infections, call and cancel the procedure. . Arrival: You must be in the facility at least 30 minutes prior to your scheduled procedure. . Children: Do not bring any children with you. . Dress appropriately: Bring dark clothing that you would not mind if they get stained. . Valuables: Do not bring any jewelry or valuables.  Reasons to call and reschedule or cancel your procedure: (Following these  recommendations will minimize the risk of a serious complication.) . Surgeries: Avoid having procedures within 2 weeks of any surgery. (Avoid for 2 weeks before or after any surgery). . Flu Shots: Avoid having procedures within 2 weeks of a flu shots or . (Avoid for 2 weeks before or after immunizations). . Barium: Avoid having a procedure within 7-10 days after having had a radiological study involving the use of radiological contrast. (Myelograms, Barium swallow or enema study). . Heart attacks: Avoid any elective procedures or surgeries for the initial 6 months after a "Myocardial Infarction" (Heart Attack). . Blood thinners: It is imperative that you stop these medications before procedures. Let us know if you if you take any blood thinner.  . Infection: Avoid procedures during or within two weeks of an infection (including chest colds or gastrointestinal problems). Symptoms associated with infections include: Localized redness, fever, chills, night sweats or profuse sweating, burning sensation when voiding, cough, congestion, stuffiness, runny nose, sore throat, diarrhea, nausea, vomiting, cold or Flu symptoms, recent or current infections. It is specially important if the infection is over the area that we intend to treat. . Heart and lung problems: Symptoms that may suggest an active cardiopulmonary problem include: cough, chest pain, breathing difficulties or shortness of breath, dizziness, ankle swelling, uncontrolled high or unusually low blood pressure, and/or palpitations. If you are experiencing any of these symptoms, cancel your procedure and contact your primary care physician for an evaluation.  Remember:  Regular Business hours are:  Monday to Thursday 8:00 AM to 4:00 PM  Provider's Schedule: Carleta Woodrow, MD:  Procedure days: Tuesday and Thursday 7:30 AM to 4:00 PM  Bilal   Lateef, MD:  Procedure days: Monday and Wednesday 7:30 AM to 4:00  PM ____________________________________________________________________________________________    

## 2018-10-04 ENCOUNTER — Ambulatory Visit (INDEPENDENT_AMBULATORY_CARE_PROVIDER_SITE_OTHER): Payer: Medicare Other | Admitting: Family Medicine

## 2018-10-04 ENCOUNTER — Encounter: Payer: Self-pay | Admitting: Family Medicine

## 2018-10-04 ENCOUNTER — Other Ambulatory Visit: Payer: Self-pay

## 2018-10-04 DIAGNOSIS — J3089 Other allergic rhinitis: Secondary | ICD-10-CM | POA: Diagnosis not present

## 2018-10-04 DIAGNOSIS — J432 Centrilobular emphysema: Secondary | ICD-10-CM

## 2018-10-04 MED ORDER — IPRATROPIUM BROMIDE 0.06 % NA SOLN: 2.0000 | Freq: Four times a day (QID) | NASAL | 0 refills | Status: DC

## 2018-10-04 NOTE — Progress Notes (Signed)
Virtual Visit via Telephone The purpose of this virtual visit is to provide medical care while limiting exposure to the novel coronavirus (COVID19) for both patient and office staff.  Consent was obtained for phone visit:  Yes.   Answered questions that patient had about telehealth interaction:  Yes.   I discussed the limitations, risks, security and privacy concerns of performing an evaluation and management service by telephone. I also discussed with the patient that there may be a patient responsible charge related to this service. The patient expressed understanding and agreed to proceed.  Patient Location: Home Provider Location: [facility])   ---------------------------------------------------------------------- Chief Complaint  Patient presents with  . Cough    nasal drainage denies fever same as last visit onset 4 days    S: Reviewed CMA documentation. I have called patient and gathered additional HPI as follows:  FOLLOW-UP Centrilobular Emphysema / COPD EXACERBATION / Allergic Rhinitis - Last visit with me virtual visit on 09/17/18 and 09/20/18 - treated for COPD exacerbation with Levaquin and Prednisone burst, then returned with recurrent symptoms without fever but more productive cough, he was given extended prednisone course over 7 more days. See prior notes for background info - Today, now worsening again similar symptoms, with more deeper chest congestion, with dry cough, non productive, feels short of breath at times.  - he did not realize that it has only been about 2-3 weeks since last visits and treatment - he remains improved overall but still has nasal drainage, congestion, and cough - Taking Anoro. Declines albuterol - He takes chronic doxycycline for 10 days out of each month due to chronic pulmonary concerns - has not taken recently  Patient currently isolation Denies any high risk travel to areas of current concern for COVID19. Denies any  known or suspected exposure to person with or possibly with COVID19.  Denies any fevers, chills, sweats, body ache, sinus pain or pressure, headache, abdominal pain, diarrhea  -------------------------------------------------------------------------- O: No physical exam performed due to remote telephone encounter.  -------------------------------------------------------------------------- A&P:   Suspected recent allergic rhinitis/sinusitis component Persistent cough / setting of Emphysema COPD, resolving recent acute COPD Exacerbation S/p levaquin and prednisone extended course - Reassuring without high risk symptoms - Afebrilebut does have dyspnea at baseline now improved - Comorbid pulmonary condition with COPD  - Currently patient is LOW RISK for COVID19based on current symptoms and no known travel/exposure - however at this time, now [place] is currently within phase of community spread, and therefore patient can still be potentially exposed. Cannot rule out case with mild symptoms. Testing is not recommended at this time due to mild symptoms.  1. No new antibiotic rx at this time. No prednisone extension, within past 2-3 weeks of completing these courses 2. May use existing Doxycycline as he routinely takes periodically each month for preventing pneumonia when needed - advised him to follow instructions given by pulmonology for this 3. Start Atrovent nasal spray decongestant 2 sprays in each nostril up to 4 times daily for 7 days 4. Continue Anoro 5. Offered Albuterol PRn he declined 6. May use Mucinex, Sudafed PRN - caution side effects  Meds ordered this encounter  Medications  . ipratropium (ATROVENT) 0.06 % nasal spray    Sig: Place 2 sprays into both nostrils 4 (four) times daily. For up to 5-7 days then stop.    Dispense:  15 mL    Refill:  0   OPTIONAL RECOMMENDED self quarantine for patient safety for PREVENTION ONLY.  It is not required based on current clinical  symptoms. If they were to develop fever or worsening shortness of breath, then emphasis on REQUIRED quarantine for up to 7-14 days that could be resolved if fever free >3 days AND if symptoms improving after 7 days.   If symptoms do not resolve or significantly improve OR if WORSENING - fever / cough - or worsening shortness of breath - then should contact us and seek advice on next steps in treatment at home vs where/when to seek care at Urgent Care or Hospital ED for further intervention and possible testing if indicated.  Patient verbalizes understanding with the above medical recommendations including the limitation of remote medical advice.  Specific follow-up / call-back criteria were given for patient to follow-up or seek medical care more urgently if needed.   - Time spent in direct consultation with patient on phone: 9 minutes  [name], [facility] 10/04/2018, 9:44 AM

## 2018-10-04 NOTE — Patient Instructions (Addendum)
AVS info given by phone. 

## 2018-10-07 ENCOUNTER — Telehealth: Payer: Self-pay | Admitting: Family Medicine

## 2018-10-07 DIAGNOSIS — J441 Chronic obstructive pulmonary disease with (acute) exacerbation: Secondary | ICD-10-CM

## 2018-10-07 MED ORDER — LEVOFLOXACIN 500 MG PO TABS: 500.0000 mg | ORAL_TABLET | Freq: Every day | ORAL | 0 refills | Status: DC

## 2018-10-07 MED ORDER — PREDNISONE 20 MG PO TABS: ORAL_TABLET | ORAL | 0 refills | Status: DC

## 2018-10-07 NOTE — Telephone Encounter (Signed)
Patient's last virtual office visit was on 10/04/18 see note.  Patient called back today with worsening COPD symptoms productive cough, dyspnea, similar to prior exacerbation that has been treated with levaquin and prednisone before. His wish is to avoid hospitalization if at all possible. I advised I am cautious with repeat course antibiotic and steroid since he was already recently treated 2-3 weeks ago, but he did improve - I advised that we would offer 1 more week of his antibiotic levaquin and prednisone course for COPD, then if no improvement or worsening again - he needs to seek care at hospital ED for further evaluation, he understands the risks and benefits and return precautions.  [name], [facility] 10/07/2018, 11:26 AM

## 2018-10-08 ENCOUNTER — Ambulatory Visit: Payer: Self-pay | Admitting: Pain Medicine

## 2018-10-21 NOTE — Progress Notes (Signed)
Pain Management Virtual Encounter Note - Virtual Visit via Telephone Telehealth (real-time audio visits between healthcare provider and patient).  Patient's Phone No. & Preferred Pharmacy:  [phone] (home); [phone] (mobile); (Preferred) [phone] No e-mail address on record  [address], [place]. [place] [address] Phone: [phone] Fax: [phone]   Pre-screening note:  Our staff contacted Aaron Gibbs and offered him an "in person", "face-to-face" appointment versus a telephone encounter. He indicated preferring the telephone encounter, at this time.  Reason for Virtual Visit: COVID-19*  Social distancing based on CDC and AMA recommendations.   I contacted Aaron Gibbs on 10/23/2018 at 12:20 PM via telephone and clearly identified myself as Gaspar Cola, MD. I verified that I was speaking with the correct person using two identifiers (Name and date of birth: 1934/09/06).  Advanced Informed Consent I sought verbal advanced consent from Aaron Gibbs for virtual visit interactions. I informed Aaron Gibbs of possible security and privacy concerns, risks, and limitations associated with providing "not-in-person" medical evaluation and management services. I also informed Aaron Gibbs of the availability of "in-person" appointments. Finally, I informed him that there would be a charge for the virtual visit and that he could be  personally, fully or partially, financially responsible for it. Aaron Gibbs expressed understanding and agreed to proceed.   Historic Elements   Aaron Gibbs is a 84 y.o. year old, male patient evaluated today after his last encounter by our practice on 09/25/2018. Aaron Gibbs  has a past medical history of Anxiety, Cancer ([place]), COPD (chronic obstructive pulmonary disease) ([place]), Glaucoma, and Osteoporosis. He also  has a past surgical history that includes Cholecystectomy and Transurethral  resection of prostate (N/A, 2008). Aaron Gibbs has a current medication list which includes the following prescription(s): amlodipine, aspirin ec, diclofenac sodium, doxycycline, duloxetine, fenofibrate, ferrous sulfate, ipratropium, meloxicam, multivitamin with minerals, oxycodone hcl, pantoprazole, polyethylene glycol, prednisolone acetate, pregabalin, quetiapine, senna, timolol, and umeclidinium-vilanterol. He  reports that he has quit smoking. He quit after 15.00 years of use. His smokeless tobacco use includes chew. He reports previous alcohol use. He reports that he does not use drugs. Aaron Gibbs is allergic to azithromycin.   HPI  I last communicated with him on 09/25/2018. Today, he is being contacted for medication management.  Unfortunately, it is looking like there may be a problem brewing out with this patient.  According to the PMP he had his oxycodone prescription filled on 10/04/2018 and he is telling me now that he does not have enough medication to last until his next refill on 11/03/2018.  He claims that the medication is not enough and he wants me to bring it up to 10 mg 4 times a day.  I flatly refused that and I explained to the patient that to start with I was not feeling very comfortable prescribing any opioids for him since he had a history of attempted suicide by taking pills.  He was very adamant that he needed more pain medicine and he kept insisting that I was "obligated" to keep him comfortable and give him more medication if he needed it.  This conversation went on for another 20 minutes and I had to finally explained to the patient that I would not be bowing to his demands, especially because he was not following my instructions on how to correctly take the medication.  The prescription that I gave him, had he taken it as prescribed, should have lasted  him until 11/03/2018.  This means that I did give him enough medication to last for the 30 days.  Unfortunately, the issue here is  that he is not taking the medication as I am prescribing it.  I make sure to have him understand that this was a "controlled substance" and that that meant that I had to have control over his regimen and that he could not simply take him as he wanted them to.  Although it is true that according to the PMP he was already taking oxycodone/APAP 10/325 3 times daily, it was not my intention to go back to that same dose.  Unfortunately, because we are in the middle of this COVID-19 pandemic, there is not much that I can do for him in terms of interventional therapies.  However, I have reminded him that the plan is to complete the series of 5 intra-articular Hyalgan knee injections and if the pain returns then the next step would be to do a genicular nerve block.  If that diagnostic injection provides him with good relief of the pain, but the pain continues to return, the next step would be to proceed with an RFA.  Once I get this under control, it is my intention to completely stop the opioids.  Taking all of the above into account and after careful consideration, I have agreed to give him a prescription for oxycodone IR 10 mg to be taken 1 tablet every 8 hours PRN for pain.  However, before agreeing to send the prescription to his pharmacy I would make sure that he understood and repeated back to me that should he take this medication in any other way other than prescribed and he runs out of medicine early, not only what I would not continue to prescribe the medicine but I would permanently cease to prescribe any type of opioid analgesics to him.  The patient was informed that I will not be tolerating any further noncompliance with my instructions on how to use the medication.  He was informed of the dangers of this including that of death.  Pharmacotherapy Assessment  Analgesic: Oxycodone IR 5 mg every 6 hours (20 mg/day of oxycodone).  Today I will change this to oxycodone IR 10 mg every 8 hours (30 mg/day of  oxycodone) MME/day: 30 mg/day.  Today this will be increased to 45 mg/day.  Monitoring: Pharmacotherapy: No side-effects or adverse reactions reported. [place] PMP: PDMP reviewed during this encounter.          Compliance: No problems identified or detected. Plan: Refer to "POC".  Review of recent tests  MR KNEE RIGHT WO CONTRAST CLINICAL DATA:  Bilateral knee pain for 10 years. No known injury. History of ankylosing spondylitis.  EXAM: MRI OF THE RIGHT KNEE WITHOUT CONTRAST  TECHNIQUE: Multiplanar, multisequence MR imaging of the knee was performed. No intravenous contrast was administered.  COMPARISON:  Plain films right knee 07/10/2018.  FINDINGS: MENISCI  Medial meniscus:  Intact.  Lateral meniscus: Intact. There is some fraying along the free edge of the body.  LIGAMENTS  Cruciates:  Intact.  Collaterals:  Intact.  CARTILAGE  Patellofemoral:  Normal.  Medial:  Mildly degenerated.  Lateral:  Mildly degenerated.  Joint:  Small effusion.  Popliteal Fossa:  Small Baker's cyst.  Extensor Mechanism: Intact. Small foci of intrasubstance increased T2 signal are seen in the patellar tendon.  Bones:  Normal marrow signal throughout.  Other: None.  IMPRESSION: Negative for meniscal or ligament tear.  Mild medial and  lateral compartment osteoarthritis.  Mild patellar tendinosis.  Electronically Signed   By: Inge Rise M.D.   On: 08/05/2018 13:26  MR KNEE LEFT WO CONTRAST CLINICAL DATA:  Chronic left knee pain. No known injury. History of ankylosing spondylitis.  EXAM: MRI OF THE LEFT KNEE WITHOUT CONTRAST  TECHNIQUE: Multiplanar, multisequence MR imaging of the knee was performed. No intravenous contrast was administered.  COMPARISON:  Plain films left knee 07/10/2018.  FINDINGS: MENISCI  Medial meniscus: Focal longitudinal tear is seen in the central aspect of the posterior horn just behind the free edge.  Lateral meniscus: Horizontal  tear throughout the body reaches the meniscal undersurface. The body is degenerated with fraying along its free edge.  LIGAMENTS  Cruciates:  Intact.  Collaterals:  Intact.  CARTILAGE  Patellofemoral:  Preserved.  Medial:  Minimally degenerated.  Lateral:  Minimally degenerated.  Joint:  Small effusion.  Popliteal Fossa:  Small Baker's cyst.  Extensor Mechanism:  Intact.  Bones: 1 cm in diameter enchondroma distal femur incidentally noted. Small degenerative cysts are seen subjacent to the tibial eminences. No fracture or worrisome lesion.  Other: None.  IMPRESSION: Degenerated body of the lateral meniscus with a horizontal tear reaching the meniscal undersurface.  Small longitudinal tear central posterior horn of the medial meniscus.  Mild medial and lateral compartment osteoarthritis.  Electronically Signed   By: Inge Rise M.D.   On: 08/05/2018 12:36   Office Visit on 08/16/2018  Component Date Value Ref Range Status  . Influenza A, POC 08/16/2018 Negative  Negative Final  . Influenza B, POC 08/16/2018 Negative  Negative Final   Assessment  The primary encounter diagnosis was Chronic knee pain (Primary Area of Pain) (Bilateral) (L>R). Diagnoses of Osteoarthritis of knee (Bilateral), Chronic low back pain (Bilateral) w/o sciatica, DISH (diffuse idiopathic skeletal hyperostosis), Chronic pain syndrome, Long term current use of opiate analgesic, Suicide attempt ([place]) (05/13/18), and Noncompliance with medication treatment due to overuse of medication were also pertinent to this visit.  Plan of Care  I have discontinued Gwyndolyn Saxon [place]'s oxyCODONE, predniSONE, and levofloxacin. I am also having him start on Oxycodone HCl. Additionally, I am having him maintain his pantoprazole, amLODipine, umeclidinium-vilanterol, ferrous sulfate, fenofibrate, timolol, aspirin EC, senna, prednisoLONE acetate, multivitamin with minerals, polyethylene glycol, DULoxetine,  QUEtiapine, doxycycline, pregabalin, diclofenac sodium, meloxicam, and ipratropium.  Pharmacotherapy (Medications Ordered): Meds ordered this encounter  Medications  . Oxycodone HCl 10 MG TABS    Sig: Take 1 tablet (10 mg total) by mouth 3 (three) times daily as needed for up to 30 days. Must last 30 days.    Dispense:  90 tablet    Refill:  0    [place] STOP ACT - Not applicable to Chronic Pain Syndrome (G89.4) diagnosis. Fill one day early if pharmacy is closed on scheduled refill date. Do not fill until: 10/27/18. To last until: 11/26/18.   Orders:  Orders Placed This Encounter  Procedures  . KNEE INJECTION    Hyalgan knee injection. Please order Hyalgan.    Standing Status:   Future    Standing Expiration Date:   11/23/2018    Scheduling Instructions:     Procedure: Intra-articular Hyalgan Knee injection #1     Side: Bilateral     Sedation: None     Timeframe: in two (2) weeks    Order Specific Question:   Where will this procedure be performed?    Answer:   [facility]   Follow-up plan:   Return in about  26 days (around 11/18/2018) for Med-Mgmt, (Virtual Visit), in addition, Procedure, (no sedation): (B) Hyalgan #1.  Due to the length of time between his last injection and the next time they were able to repeat cells, we will be starting the series again at #1.  We will proceed with this as soon as they left the COVID-19 restrictions.   Interventional management options: Planned, scheduled, and/or pending: Therapeutic bilateral intra-articular Hyalgan knee injection #4   Considering: Diagnostic/therapeutic bilateral intra-articular knee injection with local anesthetic and steroids Therapeutic,series of 5, bilateral intra-articular Hyalgan knee injections Diagnostic bilateral genicular nerve block Possible bilateral genicular nerve RFA   PRN Procedures: None at this time   I discussed the assessment and treatment plan with the patient. The patient was  provided an opportunity to ask questions and all were answered. The patient agreed with the plan and demonstrated an understanding of the instructions.  Patient advised to call back or seek an in-person evaluation if the symptoms or condition worsens.  Total duration of non-face-to-face encounter: 22 minutes.  Note by: Gaspar Cola, MD Date: 10/23/2018; Time: 1:00 PM  Disclaimer:  * Given the special circumstances of the COVID-19 pandemic, the federal government has announced that the Office for Civil Rights (OCR) will exercise its enforcement discretion and will not impose penalties on physicians using telehealth in the event of noncompliance with regulatory requirements under the [place] and [place] (HIPAA) in connection with the good faith provision of telehealth during the AQTMA-26 national public health emergency. ([place])

## 2018-10-22 ENCOUNTER — Telehealth: Payer: Self-pay

## 2018-10-22 NOTE — Telephone Encounter (Signed)
Called no answer, left message for patient to call us so we could get some information for tomorrows visit.

## 2018-10-22 NOTE — Telephone Encounter (Signed)
No answer, left message to call us.

## 2018-10-23 ENCOUNTER — Encounter: Payer: Self-pay | Admitting: Pain Medicine

## 2018-10-23 ENCOUNTER — Ambulatory Visit: Payer: Medicare Other | Attending: Pain Medicine | Admitting: Pain Medicine

## 2018-10-23 ENCOUNTER — Telehealth: Payer: Self-pay | Admitting: *Deleted

## 2018-10-23 ENCOUNTER — Other Ambulatory Visit: Payer: Self-pay

## 2018-10-23 DIAGNOSIS — M545 Low back pain: Secondary | ICD-10-CM | POA: Diagnosis not present

## 2018-10-23 DIAGNOSIS — Z91148 Patient's other noncompliance with medication regimen for other reason: Secondary | ICD-10-CM | POA: Insufficient documentation

## 2018-10-23 DIAGNOSIS — M170 Bilateral primary osteoarthritis of knee: Secondary | ICD-10-CM | POA: Diagnosis not present

## 2018-10-23 DIAGNOSIS — T1491XA Suicide attempt, initial encounter: Secondary | ICD-10-CM

## 2018-10-23 DIAGNOSIS — M25561 Pain in right knee: Secondary | ICD-10-CM | POA: Diagnosis not present

## 2018-10-23 DIAGNOSIS — M4810 Ankylosing hyperostosis [Forestier], site unspecified: Secondary | ICD-10-CM

## 2018-10-23 DIAGNOSIS — Z9114 Patient's other noncompliance with medication regimen: Secondary | ICD-10-CM | POA: Insufficient documentation

## 2018-10-23 DIAGNOSIS — Z79891 Long term (current) use of opiate analgesic: Secondary | ICD-10-CM

## 2018-10-23 DIAGNOSIS — G894 Chronic pain syndrome: Secondary | ICD-10-CM

## 2018-10-23 DIAGNOSIS — M25562 Pain in left knee: Secondary | ICD-10-CM

## 2018-10-23 DIAGNOSIS — G8929 Other chronic pain: Secondary | ICD-10-CM

## 2018-10-23 MED ORDER — OXYCODONE HCL 10 MG PO TABS: 10.0000 mg | ORAL_TABLET | Freq: Three times a day (TID) | ORAL | 0 refills | Status: DC | PRN

## 2018-10-23 NOTE — Patient Instructions (Signed)
____________________________________________________________________________________________  Preparing for your procedure (without sedation)  Procedure appointments are limited to planned procedures: . No Prescription Refills. . No disability issues will be discussed. . No medication changes will be discussed.  Instructions: . Oral Intake: Do not eat or drink anything for at least 3 hours prior to your procedure. . Transportation: Unless otherwise stated by your physician, you may drive yourself after the procedure. . Blood Pressure Medicine: Take your blood pressure medicine with a sip of water the morning of the procedure. . Blood thinners: Notify our staff if you are taking any blood thinners. Depending on which one you take, there will be specific instructions on how and when to stop it. . Diabetics on insulin: Notify the staff so that you can be scheduled 1st case in the morning. If your diabetes requires high dose insulin, take only  of your normal insulin dose the morning of the procedure and notify the staff that you have done so. . Preventing infections: Shower with an antibacterial soap the morning of your procedure.  . Build-up your immune system: Take 1000 mg of Vitamin C with every meal (3 times a day) the day prior to your procedure. . Antibiotics: Inform the staff if you have a condition or reason that requires you to take antibiotics before dental procedures. . Pregnancy: If you are pregnant, call and cancel the procedure. . Sickness: If you have a cold, fever, or any active infections, call and cancel the procedure. . Arrival: You must be in the facility at least 30 minutes prior to your scheduled procedure. . Children: Do not bring any children with you. . Dress appropriately: Bring dark clothing that you would not mind if they get stained. . Valuables: Do not bring any jewelry or valuables.  Reasons to call and reschedule or cancel your procedure: (Following these  recommendations will minimize the risk of a serious complication.) . Surgeries: Avoid having procedures within 2 weeks of any surgery. (Avoid for 2 weeks before or after any surgery). . Flu Shots: Avoid having procedures within 2 weeks of a flu shots or . (Avoid for 2 weeks before or after immunizations). . Barium: Avoid having a procedure within 7-10 days after having had a radiological study involving the use of radiological contrast. (Myelograms, Barium swallow or enema study). . Heart attacks: Avoid any elective procedures or surgeries for the initial 6 months after a "Myocardial Infarction" (Heart Attack). . Blood thinners: It is imperative that you stop these medications before procedures. Let us know if you if you take any blood thinner.  . Infection: Avoid procedures during or within two weeks of an infection (including chest colds or gastrointestinal problems). Symptoms associated with infections include: Localized redness, fever, chills, night sweats or profuse sweating, burning sensation when voiding, cough, congestion, stuffiness, runny nose, sore throat, diarrhea, nausea, vomiting, cold or Flu symptoms, recent or current infections. It is specially important if the infection is over the area that we intend to treat. . Heart and lung problems: Symptoms that may suggest an active cardiopulmonary problem include: cough, chest pain, breathing difficulties or shortness of breath, dizziness, ankle swelling, uncontrolled high or unusually low blood pressure, and/or palpitations. If you are experiencing any of these symptoms, cancel your procedure and contact your primary care physician for an evaluation.  Remember:  Regular Business hours are:  Monday to Thursday 8:00 AM to 4:00 PM  Provider's Schedule: Orren Pietsch, MD:  Procedure days: Tuesday and Thursday 7:30 AM to 4:00 PM  Bilal   Lateef, MD:  Procedure days: Monday and Wednesday 7:30 AM to 4:00  PM ____________________________________________________________________________________________    

## 2018-10-24 ENCOUNTER — Telehealth: Payer: Self-pay

## 2018-10-24 NOTE — Telephone Encounter (Signed)
The patient left a vm this morning letting us know that Dr. Dossie Arbour still hasn't called in his script. Pharmacy states its not there.

## 2018-10-24 NOTE — Telephone Encounter (Signed)
Spoke with pharmacist, they do have the prescription, but it has not been 30 days since last fill. [name] states Dr. Dossie Arbour told him he could fill early because of the dose change. Advised [name] that he cnan fill on 10-27-18.

## 2018-11-14 ENCOUNTER — Encounter: Payer: Self-pay | Admitting: Pain Medicine

## 2018-11-14 ENCOUNTER — Other Ambulatory Visit: Payer: Self-pay | Admitting: Pain Medicine

## 2018-11-14 DIAGNOSIS — Z01818 Encounter for other preprocedural examination: Secondary | ICD-10-CM | POA: Insufficient documentation

## 2018-11-17 NOTE — Progress Notes (Signed)
Pain Management Virtual Encounter Note - Virtual Visit via Telephone Telehealth (real-time audio visits between healthcare provider and patient).  Patient's Phone No. & Preferred Pharmacy:  [phone] (home); [phone] (mobile); (Preferred) [phone] No e-mail address on record  [address], [address] [place] [address] Phone: [phone] Fax: [phone]   Pre-screening note:  Our staff contacted Aaron Gibbs and offered him an "in person", "face-to-face" appointment versus a telephone encounter. He indicated preferring the telephone encounter, at this time.  Reason for Virtual Visit: COVID-19*  Social distancing based on CDC and AMA recommendations.   I contacted Aaron Gibbs on 11/18/2018 at 8:56 AM via telephone.      I clearly identified myself as Gaspar Cola, MD. I verified that I was speaking with the correct person using two identifiers (Name and date of birth: June 09, 1935).  Advanced Informed Consent I sought verbal advanced consent from Aaron Gibbs for virtual visit interactions. I informed Aaron Gibbs of possible security and privacy concerns, risks, and limitations associated with providing "not-in-person" medical evaluation and management services. I also informed Aaron Gibbs of the availability of "in-person" appointments. Finally, I informed him that there would be a charge for the virtual visit and that he could be  personally, fully or partially, financially responsible for it. Aaron Gibbs expressed understanding and agreed to proceed.   Historic Elements   Aaron Gibbs is a 84 y.o. year old, male patient evaluated today after his last encounter by our practice on 10/24/2018. Aaron Gibbs  has a past medical history of Anxiety, Cancer ([place]), COPD (chronic obstructive pulmonary disease) ([place]), Glaucoma, and Osteoporosis. He also  has a past surgical history that includes Cholecystectomy and Transurethral  resection of prostate (N/A, 2008). Aaron Gibbs has a current medication list which includes the following prescription(s): amlodipine, aspirin ec, diclofenac sodium, duloxetine, fenofibrate, ferrous sulfate, ipratropium, meloxicam, multivitamin with minerals, oxycodone hcl, pantoprazole, polyethylene glycol, prednisolone acetate, pregabalin, quetiapine, timolol, and umeclidinium-vilanterol. He  reports that he has quit smoking. He quit after 15.00 years of use. His smokeless tobacco use includes chew. He reports previous alcohol use. He reports that he does not use drugs. Aaron Gibbs is allergic to azithromycin.   HPI  I last communicated with him on 10/23/2018. Today, he is being contacted for medication management.  The patient indicates doing better but he was wondering if we could the Lyrica.  He is currently taking 100 mg 4 times a day.  Because the maximum recommended doses 450 mg/day, I will go ahead and increase it to 150 mg 3 times daily.  Patient was informed of this change and schedule.  He is scheduled to come in on Thursday for a bilateral Hyalgan knee injection.  We had previously done 3 of those injections, but because of that COVID-19 restrictions we were forced to stop them.  Because of this, we will resume the therapy starting from the first injection.  Pharmacotherapy Assessment  Analgesic: Oxycodone 10 mg 1 tablet p.o. every 8 hours (30 mg/day of oxycodone) MME/day: 45 mg/day.   Monitoring: Pharmacotherapy: No side-effects or adverse reactions reported.  PMP: PDMP reviewed during this encounter.       Compliance: No problems identified. Effectiveness: Clinically acceptable. Plan: Refer to "POC".  Pertinent Labs  Renal Function Lab Results  Component Value Date   BUN 33 (H) 07/30/2018   CREATININE 0.90 07/30/2018   BCR 28 (H) 07/10/2018   GFRAA >60 07/30/2018   GFRNONAA >  60 07/30/2018   Hepatic Function Lab Results  Component Value Date   AST 17 07/10/2018   ALT 25  06/12/2018   ALBUMIN 4.2 07/10/2018   UDS Summary  Date Value Ref Range Status  07/10/2018 FINAL  Final    Comment:    ==================================================================== TOXAS[facility]RE COMP DRUG ANALYSIS,UR ==================================================================== Test                             Result       Flag       Units Drug Present and Declared for Prescription Verification   Morphine                       >40347       EXPECTED   ng/mg creat   Normorphine                    221          EXPECTED   ng/mg creat    Potential sources of large amounts of morphine in the absence of    codeine include administration of morphine or use of heroin.    Normorphine is an expected metabolite of morphine.   Hydromorphone                  88           EXPECTED   ng/mg creat    Hydromorphone may be present as a metabolite of morphine;    concentrations of hydromorphone rarely exceed 5% of the morphine    concentration when this is the source of hydromorphone.   Oxycodone                      1094         EXPECTED   ng/mg creat   Oxymorphone                    1028         EXPECTED   ng/mg creat   Noroxycodone                   3205         EXPECTED   ng/mg creat   Noroxymorphone                 323          EXPECTED   ng/mg creat    Sources of oxycodone are scheduled prescription medications.    Oxymorphone, noroxycodone, and noroxymorphone are expected    metabolites of oxycodone. Oxymorphone is also available as a    scheduled prescription medication.   Duloxetine                     PRESENT      EXPECTED   Quetiapine                     PRESENT      EXPECTED   Acetaminophen                  PRESENT      EXPECTED Drug Present not Declared for Prescription Verification   Chlorpheniramine               PRESENT      UNEXPECTED   Hydroxyzine  PRESENT      UNEXPECTED Drug Absent but Declared for Prescription Verification   Pregabalin                      Not Detected UNEXPECTED   Salicylate                     Not Detected UNEXPECTED    Aspirin, as indicated in the declared medication list, is not    always detected even when used as directed. ==================================================================== Test                      Result    Flag   Units      Ref Range   Creatinine              94               mg/dL      >=20 ==================================================================== Declared Medications:  The flagging and interpretation on this report are based on the  following declared medications.  Unexpected results may arise from  inaccuracies in the declared medications.  **Note: The testing scope of this panel includes these medications:  Duloxetine  Morphine (Morphine Sulfate)  Oxycodone (Oxycodone Acetaminophen)  Pregabalin  Quetiapine  **Note: The testing scope of this panel does not include small to  moderate amounts of these reported medications:  Acetaminophen (Oxycodone Acetaminophen)  Aspirin  **Note: The testing scope of this panel does not include following  reported medications:  Amlodipine (Norvasc)  Doxycycline  Fenofibrate  Iron (Ferrous Sulfate)  Meloxicam  Multivitamin  Pantoprazole (Protonix)  Polyethylene Glycol  Prednisolone  Sennosides  Timolol  Umeclidinium (Anoro Ellipta)  Vilanterol (Anoro Ellipta) ==================================================================== For clinical consultation, please call [phone]. ====================================================================    Note: Above Lab results reviewed.  Recent imaging  MR KNEE RIGHT WO CONTRAST CLINICAL DATA:  Bilateral knee pain for 10 years. No known injury. History of ankylosing spondylitis.  EXAM: MRI OF THE RIGHT KNEE WITHOUT CONTRAST  TECHNIQUE: Multiplanar, multisequence MR imaging of the knee was performed. No intravenous contrast was administered.  COMPARISON:  Plain films right  knee 07/10/2018.  FINDINGS: MENISCI  Medial meniscus:  Intact.  Lateral meniscus: Intact. There is some fraying along the free edge of the body.  LIGAMENTS  Cruciates:  Intact.  Collaterals:  Intact.  CARTILAGE  Patellofemoral:  Normal.  Medial:  Mildly degenerated.  Lateral:  Mildly degenerated.  Joint:  Small effusion.  Popliteal Fossa:  Small Baker's cyst.  Extensor Mechanism: Intact. Small foci of intrasubstance increased T2 signal are seen in the patellar tendon.  Bones:  Normal marrow signal throughout.  Other: None.  IMPRESSION: Negative for meniscal or ligament tear.  Mild medial and lateral compartment osteoarthritis.  Mild patellar tendinosis.  Electronically Signed   By: Inge Rise M.D.   On: 08/05/2018 13:26 MR KNEE LEFT WO CONTRAST CLINICAL DATA:  Chronic left knee pain. No known injury. History of ankylosing spondylitis.  EXAM: MRI OF THE LEFT KNEE WITHOUT CONTRAST  TECHNIQUE: Multiplanar, multisequence MR imaging of the knee was performed. No intravenous contrast was administered.  COMPARISON:  Plain films left knee 07/10/2018.  FINDINGS: MENISCI  Medial meniscus: Focal longitudinal tear is seen in the central aspect of the posterior horn just behind the free edge.  Lateral meniscus: Horizontal tear throughout the body reaches the meniscal undersurface. The body is degenerated with fraying along its free edge.  LIGAMENTS  Cruciates:  Intact.  Collaterals:  Intact.  CARTILAGE  Patellofemoral:  Preserved.  Medial:  Minimally degenerated.  Lateral:  Minimally degenerated.  Joint:  Small effusion.  Popliteal Fossa:  Small Baker's cyst.  Extensor Mechanism:  Intact.  Bones: 1 cm in diameter enchondroma distal femur incidentally noted. Small degenerative cysts are seen subjacent to the tibial eminences. No fracture or worrisome lesion.  Other: None.  IMPRESSION: Degenerated body of the lateral meniscus with  a horizontal tear reaching the meniscal undersurface.  Small longitudinal tear central posterior horn of the medial meniscus.  Mild medial and lateral compartment osteoarthritis.  Electronically Signed   By: Inge Rise M.D.   On: 08/05/2018 12:36  Assessment  The primary encounter diagnosis was Chronic pain syndrome. Diagnoses of Osteoarthritis of knee (Bilateral), Chronic knee pain (Primary Area of Pain) (Bilateral) (L>R), Primary osteoarthritis involving multiple joints, Neurogenic pain, Pharmacologic therapy, and History of suicide attempt (06/12/18) were also pertinent to this visit.  Plan of Care  I have discontinued Gwyndolyn Saxon Archila's senna and doxycycline. I have also changed his pregabalin and meloxicam. Additionally, I am having him maintain his pantoprazole, amLODipine, umeclidinium-vilanterol, ferrous sulfate, fenofibrate, timolol, aspirin EC, prednisoLONE acetate, multivitamin with minerals, polyethylene glycol, DULoxetine, QUEtiapine, diclofenac sodium, ipratropium, and Oxycodone HCl.  Pharmacotherapy (Medications Ordered): Meds ordered this encounter  Medications  . pregabalin (LYRICA) 150 MG capsule    Sig: Take 1 capsule (150 mg total) by mouth 3 (three) times daily for 30 days. Must last 30 days    Dispense:  90 capsule    Refill:  0    Fill one day early if pharmacy is closed on scheduled refill date. May substitute for generic if available.  . meloxicam (MOBIC) 15 MG tablet    Sig: Take 1 tablet (15 mg total) by mouth daily for 30 days.    Dispense:  30 tablet    Refill:  0    Fill one day early if pharmacy is closed on scheduled refill date. May substitute for generic if available.  . Oxycodone HCl 10 MG TABS    Sig: Take 1 tablet (10 mg total) by mouth 3 (three) times daily as needed for up to 30 days. Must last 30 days.    Dispense:  90 tablet    Refill:  0    Chronic Pain. (STOP Act - Not applicable). Fill one day early if closed on scheduled refill  date. Do not fill until: 11/26/18. To last until: 12/26/18.   Orders:  Orders Placed This Encounter  Procedures  . ToxASSURE Select 13 (MW), Urine    Volume: 30 ml(s). Minimum 3 ml of urine is needed. Document temperature of fresh sample. Indications: Long term (current) use of opiate analgesic (C16.606)   Follow-up plan:   Return for (1 mo) Med-Mgmt, (Virtual Visit), in addition, Procedure, (no sedation): (B) Hyalgan #1.    Follow-up plan:   Return in about 26 days (around 11/18/2018) for Med-Mgmt, (Virtual Visit), in addition, Procedure, (no sedation): (B) Hyalgan #1.  Due to the length of time between his last injection and the next time they were able to repeat cells, we will be starting the series again at #1.  We will proceed with this as soon as they left the COVID-19 restrictions.   Interventional management options: Planned, scheduled, and/or pending: Therapeutic bilateral intra-articular Hyalgan knee injection #1   Considering: Diagnostic/therapeutic bilateral intra-articular knee injection with local anesthetic and steroids Therapeutic,series of 5, bilateral intra-articular Hyalgan knee injections Diagnostic bilateral genicular nerve block Possible  bilateral genicular nerve RFA   PRN Procedures: None at this time    I discussed the assessment and treatment plan with the patient. The patient was provided an opportunity to ask questions and all were answered. The patient agreed with the plan and demonstrated an understanding of the instructions.  Patient advised to call back or seek an in-person evaluation if the symptoms or condition worsens.  Total duration of non-face-to-face encounter: 15 minutes.  Note by: Gaspar Cola, MD Date: 11/18/2018; Time: 9:14 AM  Note: This dictation was prepared with Dragon dictation. Any transcriptional errors that may result from this process are unintentional.  Disclaimer:  * Given the special circumstances of the  COVID-19 pandemic, the federal government has announced that the Office for Civil Rights (OCR) will exercise its enforcement discretion and will not impose penalties on physicians using telehealth in the event of noncompliance with regulatory requirements under the [place] (HIPAA) in connection with the good faith provision of telehealth during the MPNTI-14 national public health emergency. ([place])

## 2018-11-18 ENCOUNTER — Other Ambulatory Visit
Admission: RE | Admit: 2018-11-18 | Discharge: 2018-11-18 | Disposition: A | Discharge status: Home / Self Care | Payer: Medicare Other | Source: Ambulatory Visit | Attending: Pain Medicine | Admitting: Pain Medicine

## 2018-11-18 ENCOUNTER — Ambulatory Visit (HOSPITAL_BASED_OUTPATIENT_CLINIC_OR_DEPARTMENT_OTHER): Payer: Medicare Other | Admitting: Pain Medicine

## 2018-11-18 ENCOUNTER — Other Ambulatory Visit: Payer: Self-pay

## 2018-11-18 DIAGNOSIS — Z1159 Encounter for screening for other viral diseases: Secondary | ICD-10-CM | POA: Diagnosis not present

## 2018-11-18 DIAGNOSIS — S83242A Other tear of medial meniscus, current injury, left knee, initial encounter: Secondary | ICD-10-CM | POA: Diagnosis not present

## 2018-11-18 DIAGNOSIS — S83281A Other tear of lateral meniscus, current injury, right knee, initial encounter: Secondary | ICD-10-CM | POA: Diagnosis not present

## 2018-11-18 DIAGNOSIS — M810 Age-related osteoporosis without current pathological fracture: Secondary | ICD-10-CM | POA: Insufficient documentation

## 2018-11-18 DIAGNOSIS — M170 Bilateral primary osteoarthritis of knee: Secondary | ICD-10-CM | POA: Diagnosis present

## 2018-11-18 DIAGNOSIS — M150 Primary generalized (osteo)arthritis: Secondary | ICD-10-CM

## 2018-11-18 DIAGNOSIS — G8929 Other chronic pain: Secondary | ICD-10-CM

## 2018-11-18 DIAGNOSIS — Z7982 Long term (current) use of aspirin: Secondary | ICD-10-CM | POA: Diagnosis not present

## 2018-11-18 DIAGNOSIS — S83241A Other tear of medial meniscus, current injury, right knee, initial encounter: Secondary | ICD-10-CM | POA: Diagnosis not present

## 2018-11-18 DIAGNOSIS — M792 Neuralgia and neuritis, unspecified: Secondary | ICD-10-CM | POA: Diagnosis not present

## 2018-11-18 DIAGNOSIS — J449 Chronic obstructive pulmonary disease, unspecified: Secondary | ICD-10-CM | POA: Insufficient documentation

## 2018-11-18 DIAGNOSIS — G894 Chronic pain syndrome: Secondary | ICD-10-CM | POA: Diagnosis not present

## 2018-11-18 DIAGNOSIS — S83282A Other tear of lateral meniscus, current injury, left knee, initial encounter: Secondary | ICD-10-CM | POA: Insufficient documentation

## 2018-11-18 DIAGNOSIS — M25561 Pain in right knee: Secondary | ICD-10-CM

## 2018-11-18 DIAGNOSIS — Z79899 Other long term (current) drug therapy: Secondary | ICD-10-CM | POA: Diagnosis not present

## 2018-11-18 DIAGNOSIS — Z791 Long term (current) use of non-steroidal anti-inflammatories (NSAID): Secondary | ICD-10-CM | POA: Diagnosis not present

## 2018-11-18 DIAGNOSIS — H409 Unspecified glaucoma: Secondary | ICD-10-CM | POA: Insufficient documentation

## 2018-11-18 DIAGNOSIS — M25562 Pain in left knee: Secondary | ICD-10-CM

## 2018-11-18 DIAGNOSIS — Z87891 Personal history of nicotine dependence: Secondary | ICD-10-CM | POA: Diagnosis not present

## 2018-11-18 DIAGNOSIS — Z9151 Personal history of suicidal behavior: Secondary | ICD-10-CM

## 2018-11-18 DIAGNOSIS — Z79891 Long term (current) use of opiate analgesic: Secondary | ICD-10-CM | POA: Insufficient documentation

## 2018-11-18 DIAGNOSIS — Z915 Personal history of self-harm: Secondary | ICD-10-CM

## 2018-11-18 DIAGNOSIS — M159 Polyosteoarthritis, unspecified: Secondary | ICD-10-CM

## 2018-11-18 MED ORDER — PREGABALIN 150 MG PO CAPS: 150.0000 mg | ORAL_CAPSULE | Freq: Three times a day (TID) | ORAL | 0 refills | Status: DC

## 2018-11-18 MED ORDER — OXYCODONE HCL 10 MG PO TABS: 10.0000 mg | ORAL_TABLET | Freq: Three times a day (TID) | ORAL | 0 refills | Status: DC | PRN

## 2018-11-18 MED ORDER — MELOXICAM 15 MG PO TABS: 15.0000 mg | ORAL_TABLET | Freq: Every day | ORAL | 0 refills | Status: DC

## 2018-11-18 NOTE — Patient Instructions (Signed)

## 2018-11-19 LAB — NOVEL CORONAVIRUS, NAA (HOSP ORDER, SEND-OUT TO REF LAB; TAT 18-24 HRS): SARS-CoV-2, NAA: NOT DETECTED

## 2018-11-21 ENCOUNTER — Ambulatory Visit: Payer: Medicare Other | Attending: Pain Medicine | Admitting: Pain Medicine

## 2018-11-21 ENCOUNTER — Other Ambulatory Visit: Payer: Self-pay

## 2018-11-21 ENCOUNTER — Encounter: Payer: Self-pay | Admitting: Pain Medicine

## 2018-11-21 VITALS — BP 165/88 | HR 76 | Temp 97.6°F | Resp 18 | Wt 230.0 lb

## 2018-11-21 DIAGNOSIS — M25562 Pain in left knee: Secondary | ICD-10-CM | POA: Diagnosis present

## 2018-11-21 DIAGNOSIS — M170 Bilateral primary osteoarthritis of knee: Secondary | ICD-10-CM | POA: Insufficient documentation

## 2018-11-21 DIAGNOSIS — G894 Chronic pain syndrome: Secondary | ICD-10-CM | POA: Diagnosis present

## 2018-11-21 DIAGNOSIS — G8929 Other chronic pain: Secondary | ICD-10-CM | POA: Diagnosis present

## 2018-11-21 DIAGNOSIS — Z79899 Other long term (current) drug therapy: Secondary | ICD-10-CM | POA: Diagnosis present

## 2018-11-21 DIAGNOSIS — Z01818 Encounter for other preprocedural examination: Secondary | ICD-10-CM | POA: Diagnosis present

## 2018-11-21 DIAGNOSIS — M25561 Pain in right knee: Secondary | ICD-10-CM | POA: Diagnosis present

## 2018-11-21 MED ORDER — SODIUM HYALURONATE (VISCOSUP) 20 MG/2ML IX SOSY
2.0000 mL | PREFILLED_SYRINGE | Freq: Once | INTRA_ARTICULAR | Status: AC
  Administered 2018-11-21: 14:00:00 via INTRA_ARTICULAR

## 2018-11-21 MED ORDER — LIDOCAINE HCL (PF) 1 % IJ SOLN
5.0000 mL | Freq: Once | INTRAMUSCULAR | Status: AC
  Administered 2018-11-21: 14:00:00 5 mL

## 2018-11-21 MED ORDER — ROPIVACAINE HCL 2 MG/ML IJ SOLN
5.0000 mL | Freq: Once | INTRAMUSCULAR | Status: AC
  Administered 2018-11-21: 14:00:00 10 mL via INTRA_ARTICULAR

## 2018-11-21 NOTE — Addendum Note (Signed)
Addended by: Milinda Pointer A on: 11/21/2018 02:34 PM   Modules accepted: Orders

## 2018-11-21 NOTE — Progress Notes (Addendum)
Patient's Name: Aaron Gibbs  MRN: [id number]  Referring Provider: [name] *  DOB: 1935-04-02  PCP: Olin Hauser, DO  DOS: 11/21/2018  Note by: Gaspar Cola, MD  Service setting: Ambulatory outpatient  Specialty: Interventional Pain Management  Patient type: Established  Location: [facility] (AMB) Pain Management Facility  Visit type: Interventional Procedure   Primary Reason for Visit: Interventional Pain Management Treatment. CC: Knee Pain (bilateral)  Procedure:          Anesthesia, Analgesia, Anxiolysis:  Type: Therapeutic Intra-Articular Hyalgan Knee Injection #1  Region: Lateral infrapatellar Knee Region Level: Knee Joint Laterality: Bilateral  Type: Local Anesthesia Indication(s): Analgesia         Local Anesthetic: Lidocaine 1-2% Route: Infiltration ([place]/IM) IV Access: Declined Sedation: Declined   Position: Sitting   Indications: 1. Osteoarthritis of knee (Bilateral)   2. Chronic knee pain (Primary Area of Pain) (Bilateral) (L>R)   3. Chronic pain syndrome    Pain Score: Pre-procedure: 7 /10 Post-procedure: 7 /10  Pre-op Assessment:  [name] is a 84 y.o. (year old), male patient, seen today for interventional treatment. He  has a past surgical history that includes Cholecystectomy and Transurethral resection of prostate (N/A, 2008). [name] has a current medication list which includes the following prescription(s): amlodipine, aspirin ec, duloxetine, fenofibrate, ferrous sulfate, ipratropium, meloxicam, multivitamin with minerals, oxycodone hcl, pantoprazole, polyethylene glycol, prednisolone acetate, pregabalin, quetiapine, timolol, umeclidinium-vilanterol, and diclofenac sodium. His primarily concern today is the Knee Pain (bilateral)  Initial Vital Signs:  Pulse/HCG Rate: 76  Temp: 97.6 F (36.4 C) Resp: 18 BP: (!) 165/88 SpO2: 98 %  BMI: Estimated body mass index is 33 kg/m as calculated from the following:   Height as of  09/04/18: 5\' 10"  (1.778 m).   Weight as of this encounter: 230 lb (104.3 kg).  Risk Assessment: Allergies: Reviewed. He is allergic to azithromycin.  Allergy Precautions: None required Coagulopathies: Reviewed. None identified.  Blood-thinner therapy: None at this time Active Infection(s): Reviewed. None identified. [name] is afebrile  Site Confirmation: [name] was asked to confirm the procedure and laterality before marking the site Procedure checklist: Completed Consent: Before the procedure and under the influence of no sedative(s), amnesic(s), or anxiolytics, the patient was informed of the treatment options, risks and possible complications. To fulfill our ethical and legal obligations, as recommended by the American Medical Association's Code of Ethics, I have informed the patient of my clinical impression; the nature and purpose of the treatment or procedure; the risks, benefits, and possible complications of the intervention; the alternatives, including doing nothing; the risk(s) and benefit(s) of the alternative treatment(s) or procedure(s); and the risk(s) and benefit(s) of doing nothing. The patient was provided information about the general risks and possible complications associated with the procedure. These may include, but are not limited to: failure to achieve desired goals, infection, bleeding, organ or nerve damage, allergic reactions, paralysis, and death. In addition, the patient was informed of those risks and complications associated to the procedure, such as failure to decrease pain; infection; bleeding; organ or nerve damage with subsequent damage to sensory, motor, and/or autonomic systems, resulting in permanent pain, numbness, and/or weakness of one or several areas of the body; allergic reactions; (i.e.: anaphylactic reaction); and/or death. Furthermore, the patient was informed of those risks and complications associated with the medications. These include, but  are not limited to: allergic reactions (i.e.: anaphylactic or anaphylactoid reaction(s)); adrenal axis suppression; blood sugar elevation that in diabetics may result in  ketoacidosis or comma; water retention that in patients with history of congestive heart failure may result in shortness of breath, pulmonary edema, and decompensation with resultant heart failure; weight gain; swelling or edema; medication-induced neural toxicity; particulate matter embolism and blood vessel occlusion with resultant organ, and/or nervous system infarction; and/or aseptic necrosis of one or more joints. Finally, the patient was informed that Medicine is not an exact science; therefore, there is also the possibility of unforeseen or unpredictable risks and/or possible complications that may result in a catastrophic outcome. The patient indicated having understood very clearly. We have given the patient no guarantees and we have made no promises. Enough time was given to the patient to ask questions, all of which were answered to the patient's satisfaction. [name] has indicated that he wanted to continue with the procedure. Attestation: I, the ordering provider, attest that I have discussed with the patient the benefits, risks, side-effects, alternatives, likelihood of achieving goals, and potential problems during recovery for the procedure that I have provided informed consent. Date  Time: 11/21/2018 10:35 AM  Pre-Procedure Preparation:  Monitoring: As per clinic protocol. Respiration, ETCO2, SpO2, BP, heart rate and rhythm monitor placed and checked for adequate function Safety Precautions: Patient was assessed for positional comfort and pressure points before starting the procedure. Time-out: I initiated and conducted the "Time-out" before starting the procedure, as per protocol. The patient was asked to participate by confirming the accuracy of the "Time Out" information. Verification of the correct person, site, and  procedure were performed and confirmed by me, the nursing staff, and the patient. "Time-out" conducted as per Joint Commission's Universal Protocol (UP.01.01.01). Time: 1024  Description of Procedure:          Target Area: Knee Joint Approach: Just above the Lateral tibial plateau, lateral to the infrapatellar tendon. Area Prepped: Entire knee area, from the mid-thigh to the mid-shin. Prepping solution: 1M DuraPrep (Iodine Povacrylex [0.7% available iodine] and Isopropyl Alcohol, 74% w/w) Safety Precautions: Aspiration looking for blood return was conducted prior to all injections. At no point did we inject any substances, as a needle was being advanced. No attempts were made at seeking any paresthesias. Safe injection practices and needle disposal techniques used. Medications properly checked for expiration dates. SDV (single dose vial) medications used. Description of the Procedure: Protocol guidelines were followed. The patient was placed in position over the fluoroscopy table. The target area was identified and the area prepped in the usual manner. Skin & deeper tissues infiltrated with local anesthetic. Appropriate amount of time allowed to pass for local anesthetics to take effect. The procedure needles were then advanced to the target area. Proper needle placement secured. Negative aspiration confirmed. Solution injected in intermittent fashion, asking for systemic symptoms every 0.5cc of injectate. The needles were then removed and the area cleansed, making sure to leave some of the prepping solution back to take advantage of its long term bactericidal properties. Vitals:   11/21/18 1034  BP: (!) 165/88  Pulse: 76  Resp: 18  Temp: 97.6 F (36.4 C)  TempSrc: Oral  SpO2: 98%  Weight: 230 lb (104.3 kg)    Start Time: 1024 hrs. End Time: 1027 hrs. Materials:  Needle(s) Type: Regular needle Gauge: 25G Length: 1.5-in Medication(s): Please see orders for medications and dosing  details.  Imaging Guidance:          Type of Imaging Technique: None used Indication(s): N/A Exposure Time: No patient exposure Contrast: None used. Fluoroscopic Guidance: N/A Ultrasound Guidance:  N/A Interpretation: N/A  Antibiotic Prophylaxis:   Anti-infectives (From admission, onward)   None     Indication(s): None identified  Post-operative Assessment:  Post-procedure Vital Signs:  Pulse/HCG Rate: 76  Temp: 97.6 F (36.4 C) Resp: 18 BP: (!) 165/88 SpO2: 98 %  EBL: None  Complications: No immediate post-treatment complications observed by team, or reported by patient.  Note: The patient tolerated the entire procedure well. A repeat set of vitals were taken after the procedure and the patient was kept under observation following institutional policy, for this type of procedure. Post-procedural neurological assessment was performed, showing return to baseline, prior to discharge. The patient was provided with post-procedure discharge instructions, including a section on how to identify potential problems. Should any problems arise concerning this procedure, the patient was given instructions to immediately contact us, at any time, without hesitation. In any case, we plan to contact the patient by telephone for a follow-up status report regarding this interventional procedure.  Comments:  No additional relevant information.  Plan of Care  Orders:  Orders Placed This Encounter  Procedures  . KNEE INJECTION    Hyalgan knee injection to be done by MD.    Scheduling Instructions:     Procedure: Intra-articular Hyalgan Knee injection #1     Side(s): Bilateral Knee     Sedation: None     Timeframe: Today    Order Specific Question:   Where will this procedure be performed?    Answer:   [facility]  . KNEE INJECTION    Hyalgan knee injection. Please order Hyalgan.    Standing Status:   Future    Standing Expiration Date:   12/21/2018    Scheduling Instructions:      Procedure: Intra-articular Hyalgan Knee injection #2     Side: Bilateral     Sedation: None     Timeframe: in two (2) weeks    Order Specific Question:   Where will this procedure be performed?    Answer:   [facility]  . Novel Coronavirus, NAA ([facility])    Standing Status:   Standing    Number of Occurrences:   18    Standing Expiration Date:   05/22/2020    Scheduling Instructions:     Send patient to a [place] pre-admission testing facility for for collection. If patient lives out of town, look for closest facility available to patient. Inform patient that the estimated turn-around time for the results is 72 hours (3 days).    Order Specific Question:   Known Exposure    Answer:   No known exposure. Pre-procedure screening test.  . Provider attestation of informed consent for procedure/surgical case    I, the ordering provider, attest that I have discussed with the patient the benefits, risks, side effects, alternatives, likelihood of achieving goals and potential problems during recovery for the procedure that I have provided informed consent.    Standing Status:   Standing    Number of Occurrences:   1  . Informed Consent Details: Transcribe to consent form and obtain patient signature    Standing Status:   Standing    Number of Occurrences:   1    Order Specific Question:   Procedure    Answer:   Bilateral intra-articular viscosupplementation knee injection with Hyalgan    Order Specific Question:   Surgeon    Answer:   Ambika Zettlemoyer A. Dossie Arbour, MD    Order Specific Question:   Indication/Reason  Answer:   Bilateral knee osteoarthritis and arthralgia   Medications ordered for procedure: Meds ordered this encounter  Medications  . lidocaine (PF) (XYLOCAINE) 1 % injection 5 mL  . ropivacaine (PF) 2 mg/mL (0.2%) (NAROPIN) injection 5 mL  . Sodium Hyaluronate SOSY 2 mL  . Sodium Hyaluronate SOSY 2 mL   Medications administered: We administered lidocaine (PF),  ropivacaine (PF) 2 mg/mL (0.2%), Sodium Hyaluronate, and Sodium Hyaluronate.  See the medical record for exact dosing, route, and time of administration.  Disposition: Discharge home  Discharge Date & Time: 11/21/2018; 1030 hrs.   Follow-up plan:   Return in about 2 weeks (around 12/05/2018) for Procedure, (no sedation): (B) Hyalgan #2, in addition, Evaluation, (Post-proc), (Virtual Visit).     Future Appointments  Date Time Provider [place]  12/04/2018  1:45 PM Milinda Pointer, MD [facility] None  12/18/2018 10:45 AM Milinda Pointer, MD [facility] None   Primary Care Physician: Olin Hauser, DO Location: [facility] Note by: Gaspar Cola, MD Date: 11/21/2018; Time: 2:34 PM  Disclaimer:  Medicine is not an Chief Strategy Officer. The only guarantee in medicine is that nothing is guaranteed. It is important to note that the decision to proceed with this intervention was based on the information collected from the patient. The Data and conclusions were drawn from the patient's questionnaire, the interview, and the physical examination. Because the information was provided in large part by the patient, it cannot be guaranteed that it has not been purposely or unconsciously manipulated. Every effort has been made to obtain as much relevant data as possible for this evaluation. It is important to note that the conclusions that lead to this procedure are derived in large part from the available data. Always take into account that the treatment will also be dependent on availability of resources and existing treatment guidelines, considered by other Pain Management Practitioners as being common knowledge and practice, at the time of the intervention. For Medico-Legal purposes, it is also important to point out that variation in procedural techniques and pharmacological choices are the acceptable norm. The indications, contraindications, technique, and results  of the above procedure should only be interpreted and judged by a Board-Certified Interventional Pain Specialist with extensive familiarity and expertise in the same exact procedure and technique.

## 2018-11-21 NOTE — Patient Instructions (Signed)
____________________________________________________________________________________________  Post-Procedure Discharge Instructions  Instructions:  Apply ice:   Purpose: This will minimize any swelling and discomfort after procedure.   When: Day of procedure, as soon as you get home.  How: Fill a plastic sandwich bag with crushed ice. Cover it with a small towel and apply to injection site.  How long: (15 min on, 15 min off) Apply for 15 minutes then remove x 15 minutes.  Repeat sequence on day of procedure, until you go to bed.  Apply heat:   Purpose: To treat any soreness and discomfort from the procedure.  When: Starting the next day after the procedure.  How: Apply heat to procedure site starting the day following the procedure.  How long: May continue to repeat daily, until discomfort goes away.  Food intake: Start with clear liquids (like water) and advance to regular food, as tolerated.   Physical activities: Keep activities to a minimum for the first 8 hours after the procedure. After that, then as tolerated.  Driving: If you have received any sedation, be responsible and do not drive. You are not allowed to drive for 24 hours after having sedation.  Blood thinner: (Applies only to those taking blood thinners) You may restart your blood thinner 6 hours after your procedure.  Insulin: (Applies only to Diabetic patients taking insulin) As soon as you can eat, you may resume your normal dosing schedule.  Infection prevention: Keep procedure site clean and dry. Shower daily and clean area with soap and water.  Post-procedure Pain Diary: Extremely important that this be done correctly and accurately. Recorded information will be used to determine the next step in treatment. For the purpose of accuracy, follow these rules:  Evaluate only the area treated. Do not report or include pain from an untreated area. For the purpose of this evaluation, ignore all other areas of pain,  except for the treated area.  After your procedure, avoid taking a long nap and attempting to complete the pain diary after you wake up. Instead, set your alarm clock to go off every hour, on the hour, for the initial 8 hours after the procedure. Document the duration of the numbing medicine, and the relief you are getting from it.  Do not go to sleep and attempt to complete it later. It will not be accurate. If you received sedation, it is likely that you were given a medication that may cause amnesia. Because of this, completing the diary at a later time may cause the information to be inaccurate. This information is needed to plan your care.  Follow-up appointment: Keep your post-procedure follow-up evaluation appointment after the procedure (usually 2 weeks for most procedures, 6 weeks for radiofrequencies). DO NOT FORGET to bring you pain diary with you.   Expect: (What should I expect to see with my procedure?)  From numbing medicine (AKA: Local Anesthetics): Numbness or decrease in pain. You may also experience some weakness, which if present, could last for the duration of the local anesthetic.  Onset: Full effect within 15 minutes of injected.  Duration: It will depend on the type of local anesthetic used. On the average, 1 to 8 hours.   From steroids (Applies only if steroids were used): Decrease in swelling or inflammation. Once inflammation is improved, relief of the pain will follow.  Onset of benefits: Depends on the amount of swelling present. The more swelling, the longer it will take for the benefits to be seen. In some cases, up to 10 days.    Duration: Steroids will stay in the system x 2 weeks. Duration of benefits will depend on multiple posibilities including persistent irritating factors.  Side-effects: If present, they may typically last 2 weeks (the duration of the steroids).  Frequent: Cramps (if they occur, drink Gatorade and take over-the-counter Magnesium 450-500 mg  once to twice a day); water retention with temporary weight gain; increases in blood sugar; decreased immune system response; increased appetite.  Occasional: Facial flushing (red, warm cheeks); mood swings; menstrual changes.  Uncommon: Long-term decrease or suppression of natural hormones; bone thinning. (These are more common with higher doses or more frequent use. This is why we prefer that our patients avoid having any injection therapies in other practices.)   Very Rare: Severe mood changes; psychosis; aseptic necrosis.  From procedure: Some discomfort is to be expected once the numbing medicine wears off. This should be minimal if ice and heat are applied as instructed.  Call if: (When should I call?)  You experience numbness and weakness that gets worse with time, as opposed to wearing off.  New onset bowel or bladder incontinence. (Applies only to procedures done in the spine)  Emergency Numbers:  Durning business hours (Monday - Thursday, 8:00 AM - 4:00 PM) (Friday, 9:00 AM - 12:00 Noon): [phone]  After hours: [phone]  NOTE: If you are having a problem and are unable connect with, or to talk to a provider, then go to your nearest urgent care or emergency department. If the problem is serious and urgent, please call 911. ____________________________________________________________________________________________   ____________________________________________________________________________________________  Preparing for your procedure (without sedation)  Procedure appointments are limited to planned procedures: . No Prescription Refills. . No disability issues will be discussed. . No medication changes will be discussed.  Instructions: . Oral Intake: Do not eat or drink anything for at least 3 hours prior to your procedure. . Transportation: Unless otherwise stated by your physician, you may drive yourself after the procedure. . Blood Pressure Medicine:  Take your blood pressure medicine with a sip of water the morning of the procedure. . Blood thinners: Notify our staff if you are taking any blood thinners. Depending on which one you take, there will be specific instructions on how and when to stop it. . Diabetics on insulin: Notify the staff so that you can be scheduled 1st case in the morning. If your diabetes requires high dose insulin, take only  of your normal insulin dose the morning of the procedure and notify the staff that you have done so. . Preventing infections: Shower with an antibacterial soap the morning of your procedure.  . Build-up your immune system: Take 1000 mg of Vitamin C with every meal (3 times a day) the day prior to your procedure. . Antibiotics: Inform the staff if you have a condition or reason that requires you to take antibiotics before dental procedures. . Pregnancy: If you are pregnant, call and cancel the procedure. . Sickness: If you have a cold, fever, or any active infections, call and cancel the procedure. . Arrival: You must be in the facility at least 30 minutes prior to your scheduled procedure. . Children: Do not bring any children with you. . Dress appropriately: Bring dark clothing that you would not mind if they get stained. . Valuables: Do not bring any jewelry or valuables.  Reasons to call and reschedule or cancel your procedure: (Following these recommendations will minimize the risk of a serious complication.) . Surgeries: Avoid having procedures within 2 weeks   of any surgery. (Avoid for 2 weeks before or after any surgery). . Flu Shots: Avoid having procedures within 2 weeks of a flu shots or . (Avoid for 2 weeks before or after immunizations). . Barium: Avoid having a procedure within 7-10 days after having had a radiological study involving the use of radiological contrast. (Myelograms, Barium swallow or enema study). . Heart attacks: Avoid any elective procedures or surgeries for the initial 6  months after a "Myocardial Infarction" (Heart Attack). . Blood thinners: It is imperative that you stop these medications before procedures. Let us know if you if you take any blood thinner.  . Infection: Avoid procedures during or within two weeks of an infection (including chest colds or gastrointestinal problems). Symptoms associated with infections include: Localized redness, fever, chills, night sweats or profuse sweating, burning sensation when voiding, cough, congestion, stuffiness, runny nose, sore throat, diarrhea, nausea, vomiting, cold or Flu symptoms, recent or current infections. It is specially important if the infection is over the area that we intend to treat. . Heart and lung problems: Symptoms that may suggest an active cardiopulmonary problem include: cough, chest pain, breathing difficulties or shortness of breath, dizziness, ankle swelling, uncontrolled high or unusually low blood pressure, and/or palpitations. If you are experiencing any of these symptoms, cancel your procedure and contact your primary care physician for an evaluation.  Remember:  Regular Business hours are:  Monday to Thursday 8:00 AM to 4:00 PM  Provider's Schedule: Elandra Powell, MD:  Procedure days: Tuesday and Thursday 7:30 AM to 4:00 PM  Bilal Lateef, MD:  Procedure days: Monday and Wednesday 7:30 AM to 4:00 PM ____________________________________________________________________________________________     

## 2018-11-22 ENCOUNTER — Telehealth: Payer: Self-pay | Admitting: *Deleted

## 2018-11-22 NOTE — Telephone Encounter (Signed)
No problems post procedure. 

## 2018-12-02 ENCOUNTER — Other Ambulatory Visit
Admission: RE | Admit: 2018-12-02 | Discharge: 2018-12-02 | Disposition: A | Discharge status: Home / Self Care | Payer: Medicare Other | Source: Ambulatory Visit | Attending: Pain Medicine | Admitting: Pain Medicine

## 2018-12-02 ENCOUNTER — Other Ambulatory Visit: Payer: Self-pay

## 2018-12-02 DIAGNOSIS — Z1159 Encounter for screening for other viral diseases: Secondary | ICD-10-CM | POA: Diagnosis present

## 2018-12-03 LAB — NOVEL CORONAVIRUS, NAA (HOSP ORDER, SEND-OUT TO REF LAB; TAT 18-24 HRS): SARS-CoV-2, NAA: NOT DETECTED

## 2018-12-04 ENCOUNTER — Ambulatory Visit: Payer: Medicare Other | Admitting: Pain Medicine

## 2018-12-04 NOTE — Progress Notes (Deleted)
Patient's Name: Aaron Gibbs  MRN: [id number]  Referring Provider: [name] *  DOB: [birth year]  PCP: Olin Hauser, DO  DOS: 12/05/2018  Note by: Gaspar Cola, MD  Service setting: Ambulatory outpatient  Specialty: Interventional Pain Management  Patient type: Established  Location: [facility] (AMB) Pain Management Facility  Visit type: Interventional Procedure   Primary Reason for Visit: Interventional Pain Management Treatment. CC: No chief complaint on file.  Procedure:          Anesthesia, Analgesia, Anxiolysis:  Type: Therapeutic Intra-Articular Hyalgan Knee Injection #2  Region: Lateral infrapatellar Knee Region Level: Knee Joint Laterality: Bilateral  Type: Local Anesthesia Indication(s): Analgesia         Local Anesthetic: Lidocaine 1-2% Route: Infiltration ([place]/IM) IV Access: Declined Sedation: Declined   Position: Sitting   Indications: 1. Osteoarthritis of knee (Bilateral)   2. Chronic knee pain (Primary Area of Pain) (Bilateral) (L>R)    Pain Score: Pre-procedure:  /10 Post-procedure:  /10  Pre-op Assessment:  Mr. Mutch is a 84 y.o. (year old), male patient, seen today for interventional treatment. He  has a past surgical history that includes Cholecystectomy and Transurethral resection of prostate (N/A, 2008). Mr. Laursen has a current medication list which includes the following prescription(s): amlodipine, aspirin ec, diclofenac sodium, duloxetine, fenofibrate, ferrous sulfate, ipratropium, meloxicam, multivitamin with minerals, oxycodone hcl, pantoprazole, polyethylene glycol, prednisolone acetate, pregabalin, quetiapine, timolol, and umeclidinium-vilanterol. His primarily concern today is the No chief complaint on file.  Initial Vital Signs:  Pulse/HCG Rate:    Temp:   Resp:   BP:   SpO2:    BMI: Estimated body mass index is 33 kg/m as calculated from the following:   Height as of 09/04/18: 5\' 10"  (1.778 m).   Weight as of  11/21/18: 230 lb (104.3 kg).  Risk Assessment: Allergies: Reviewed. He is allergic to azithromycin.  Allergy Precautions: None required Coagulopathies: Reviewed. None identified.  Blood-thinner therapy: None at this time Active Infection(s): Reviewed. None identified. Mr. Bala is afebrile  Site Confirmation: Mr. Linder was asked to confirm the procedure and laterality before marking the site Procedure checklist: Completed Consent: Before the procedure and under the influence of no sedative(s), amnesic(s), or anxiolytics, the patient was informed of the treatment options, risks and possible complications. To fulfill our ethical and legal obligations, as recommended by the American Medical Association's Code of Ethics, I have informed the patient of my clinical impression; the nature and purpose of the treatment or procedure; the risks, benefits, and possible complications of the intervention; the alternatives, including doing nothing; the risk(s) and benefit(s) of the alternative treatment(s) or procedure(s); and the risk(s) and benefit(s) of doing nothing. The patient was provided information about the general risks and possible complications associated with the procedure. These may include, but are not limited to: failure to achieve desired goals, infection, bleeding, organ or nerve damage, allergic reactions, paralysis, and death. In addition, the patient was informed of those risks and complications associated to the procedure, such as failure to decrease pain; infection; bleeding; organ or nerve damage with subsequent damage to sensory, motor, and/or autonomic systems, resulting in permanent pain, numbness, and/or weakness of one or several areas of the body; allergic reactions; (i.e.: anaphylactic reaction); and/or death. Furthermore, the patient was informed of those risks and complications associated with the medications. These include, but are not limited to: allergic reactions (i.e.:  anaphylactic or anaphylactoid reaction(s)); adrenal axis suppression; blood sugar elevation that in diabetics may result in ketoacidosis or comma;  water retention that in patients with history of congestive heart failure may result in shortness of breath, pulmonary edema, and decompensation with resultant heart failure; weight gain; swelling or edema; medication-induced neural toxicity; particulate matter embolism and blood vessel occlusion with resultant organ, and/or nervous system infarction; and/or aseptic necrosis of one or more joints. Finally, the patient was informed that Medicine is not an exact science; therefore, there is also the possibility of unforeseen or unpredictable risks and/or possible complications that may result in a catastrophic outcome. The patient indicated having understood very clearly. We have given the patient no guarantees and we have made no promises. Enough time was given to the patient to ask questions, all of which were answered to the patient's satisfaction. Mr. Faulkner has indicated that he wanted to continue with the procedure. Attestation: I, the ordering provider, attest that I have discussed with the patient the benefits, risks, side-effects, alternatives, likelihood of achieving goals, and potential problems during recovery for the procedure that I have provided informed consent. Date  Time: {CHL [facility]-PAIN TIME CHOICES:21018001}  Pre-Procedure Preparation:  Monitoring: As per clinic protocol. Respiration, ETCO2, SpO2, BP, heart rate and rhythm monitor placed and checked for adequate function Safety Precautions: Patient was assessed for positional comfort and pressure points before starting the procedure. Time-out: I initiated and conducted the "Time-out" before starting the procedure, as per protocol. The patient was asked to participate by confirming the accuracy of the "Time Out" information. Verification of the correct person, site, and procedure were performed  and confirmed by me, the nursing staff, and the patient. "Time-out" conducted as per Joint Commission's Universal Protocol (UP.01.01.01). Time:    Description of Procedure:          Target Area: Knee Joint Approach: Just above the Lateral tibial plateau, lateral to the infrapatellar tendon. Area Prepped: Entire knee area, from the mid-thigh to the mid-shin. Prepping solution: DuraPrep (Iodine Povacrylex [0.7% available iodine] and Isopropyl Alcohol, 74% w/w) Safety Precautions: Aspiration looking for blood return was conducted prior to all injections. At no point did we inject any substances, as a needle was being advanced. No attempts were made at seeking any paresthesias. Safe injection practices and needle disposal techniques used. Medications properly checked for expiration dates. SDV (single dose vial) medications used. Description of the Procedure: Protocol guidelines were followed. The patient was placed in position over the fluoroscopy table. The target area was identified and the area prepped in the usual manner. Skin & deeper tissues infiltrated with local anesthetic. Appropriate amount of time allowed to pass for local anesthetics to take effect. The procedure needles were then advanced to the target area. Proper needle placement secured. Negative aspiration confirmed. Solution injected in intermittent fashion, asking for systemic symptoms every 0.5cc of injectate. The needles were then removed and the area cleansed, making sure to leave some of the prepping solution back to take advantage of its long term bactericidal properties. There were no vitals filed for this visit.  Start Time:   hrs. End Time:   hrs. Materials:  Needle(s) Type: Regular needle Gauge: 25G Length: 1.5-in Medication(s): Please see orders for medications and dosing details.  Imaging Guidance:          Type of Imaging Technique: None used Indication(s): N/A Exposure Time: No patient exposure Contrast: None  used. Fluoroscopic Guidance: N/A Ultrasound Guidance: N/A Interpretation: N/A  Antibiotic Prophylaxis:   Anti-infectives (From admission, onward)   None     Indication(s): None identified  Post-operative Assessment:  Post-procedure  Vital Signs:  Pulse/HCG Rate:    Temp:   Resp:   BP:   SpO2:    EBL: None  Complications: No immediate post-treatment complications observed by team, or reported by patient.  Note: The patient tolerated the entire procedure well. A repeat set of vitals were taken after the procedure and the patient was kept under observation following institutional policy, for this type of procedure. Post-procedural neurological assessment was performed, showing return to baseline, prior to discharge. The patient was provided with post-procedure discharge instructions, including a section on how to identify potential problems. Should any problems arise concerning this procedure, the patient was given instructions to immediately contact us, at any time, without hesitation. In any case, we plan to contact the patient by telephone for a follow-up status report regarding this interventional procedure.  Comments:  No additional relevant information.  Plan of Care  Orders:  No orders of the defined types were placed in this encounter.  Medications ordered for procedure: No orders of the defined types were placed in this encounter.  Medications administered: [name] had no medications administered during this visit.  See the medical record for exact dosing, route, and time of administration.  Disposition: Discharge home  Discharge Date & Time: 12/05/2018;   hrs.   Follow-up plan:   No follow-ups on file.     Future Appointments  Date Time Provider [place]  12/05/2018  9:00 AM Milinda Pointer, MD [facility] None  12/18/2018 10:45 AM Milinda Pointer, MD [facility] None   Primary Care Physician: Olin Hauser, DO Location: [facility]  Outpatient Pain Management Facility Note by: Gaspar Cola, MD Date: 12/05/2018; Time: 4:27 PM  Disclaimer:  Medicine is not an Chief Strategy Officer. The only guarantee in medicine is that nothing is guaranteed. It is important to note that the decision to proceed with this intervention was based on the information collected from the patient. The Data and conclusions were drawn from the patient's questionnaire, the interview, and the physical examination. Because the information was provided in large part by the patient, it cannot be guaranteed that it has not been purposely or unconsciously manipulated. Every effort has been made to obtain as much relevant data as possible for this evaluation. It is important to note that the conclusions that lead to this procedure are derived in large part from the available data. Always take into account that the treatment will also be dependent on availability of resources and existing treatment guidelines, considered by other Pain Management Practitioners as being common knowledge and practice, at the time of the intervention. For Medico-Legal purposes, it is also important to point out that variation in procedural techniques and pharmacological choices are the acceptable norm. The indications, contraindications, technique, and results of the above procedure should only be interpreted and judged by a Board-Certified Interventional Pain Specialist with extensive familiarity and expertise in the same exact procedure and technique.

## 2018-12-05 ENCOUNTER — Ambulatory Visit: Payer: Medicare Other | Admitting: Pain Medicine

## 2018-12-06 ENCOUNTER — Encounter: Payer: Self-pay | Admitting: Family Medicine

## 2018-12-06 ENCOUNTER — Ambulatory Visit (INDEPENDENT_AMBULATORY_CARE_PROVIDER_SITE_OTHER): Payer: Medicare Other | Admitting: Family Medicine

## 2018-12-06 ENCOUNTER — Other Ambulatory Visit: Payer: Self-pay

## 2018-12-06 DIAGNOSIS — J432 Centrilobular emphysema: Secondary | ICD-10-CM | POA: Diagnosis not present

## 2018-12-06 DIAGNOSIS — J441 Chronic obstructive pulmonary disease with (acute) exacerbation: Secondary | ICD-10-CM | POA: Diagnosis not present

## 2018-12-06 MED ORDER — PREDNISONE 20 MG PO TABS: ORAL_TABLET | ORAL | 0 refills | Status: DC

## 2018-12-06 MED ORDER — LEVOFLOXACIN 500 MG PO TABS: 500.0000 mg | ORAL_TABLET | Freq: Every day | ORAL | 0 refills | Status: DC

## 2018-12-06 NOTE — Progress Notes (Signed)
Virtual Visit via Telephone The purpose of this virtual visit is to provide medical care while limiting exposure to the novel coronavirus (COVID19) for both patient and office staff.  Consent was obtained for phone visit:  Yes.   Answered questions that patient had about telehealth interaction:  Yes.   I discussed the limitations, risks, security and privacy concerns of performing an evaluation and management service by telephone. I also discussed with the patient that there may be a patient responsible charge related to this service. The patient expressed understanding and agreed to proceed.  Patient Location: Home Provider Location: [facility])   ---------------------------------------------------------------------- Chief Complaint  Patient presents with  . Fever    cough, SOB, tremors onset 3 days being tested on Tuesday which was negative but his symptoms started wednesday    S: Reviewed CMA documentation. I have called patient and gathered additional HPI as follows:  FOLLOW-UPCentrilobular Emphysema / COPD EXACERBATION - Last visit with mevirtual visit on 09/2018 - treated for COPD exacerbation with Levaquin and Prednisone burst, then returned with recurrent symptoms without fever but more productive cough, he was given extended prednisone course over 7 more days. See prior notes for background info  Interval update was he resolved nearly 100% on last treatment.  - Today, now worsening again similar symptoms onset 3 days ago, with more deeper chest congestion, with dry cough, non productive, feels short of breath at times.  - Taking Anoro. Declines albuterol - He takes chronic doxycycline for 10 days out of each month due to chronic pulmonary concerns- has not taken recently  Patient currently isolation.Last COVID19 test done Tuesday, negative.  Denies any fevers, chills, sweats, body ache, sinus pain or pressure, headache, abdominal pain,  diarrhea  -------------------------------------------------------------------------- O: No physical exam performed due to remote telephone encounter.  -------------------------------------------------------------------------- A&P:   Recurrent AcuteCOPD Exacerbation Now febrile low grade. Recent COVID19 test NEGATIVE Tuesday - Comorbid pulmonary condition with COPD  Cannot rule out COVID19 - opted to not retest if patient can remain in quarantine isolation  1. Discussed treatment options - concern with repeat antibiotic course already within 3 months, but will agree to provide therapy since it was very effective last time - START Levaquin antibiotic 500mg  daily x 7 days 2. Prednisone taper 7 days 3. Continue Anoro, Albuterol 4. Should hold his chronic doxycycline while on Levaquin  Next options return to Pulmonology consider advanced imaging lungs or other therapy if recurrent COPD or requiring further antibiotics.  Strict return criteria given when to go to hospital  Meds ordered this encounter  Medications  . levofloxacin (LEVAQUIN) 500 MG tablet    Sig: Take 1 tablet (500 mg total) by mouth daily. For 7 days    Dispense:  7 tablet    Refill:  0  . predniSONE (DELTASONE) 20 MG tablet    Sig: Take daily with food. Start with 60mg  (3 pills) x 2 days, then reduce to 40mg  (2 pills) x 2 days, then 20mg  (1 pill) x 3 days    Dispense:  13 tablet    Refill:  0    REQUIRED self quarantine to [place] - advised to avoid all exposure with others while during treatment. Should continue to quarantine for up to 7-14 days, pending resolution of symptoms, if symptoms resolve by 7 days and is afebrile >3 days - may STOP self quarantine at that time.  If symptoms do not resolve or significantly improve OR if WORSENING - fever /  cough - or worsening shortness of breath - then should contact us and seek advice on next steps in treatment at home vs where/when to seek care at Urgent  Care or Hospital ED for further intervention and possible testing if indicated.  Patient verbalizes understanding with the above medical recommendations including the limitation of remote medical advice.  Specific follow-up / call-back criteria were given for patient to follow-up or seek medical care more urgently if needed.   - Time spent in direct consultation with patient on phone: 11 minutes  [name], [facility] 12/06/2018, 2:10 PM

## 2018-12-06 NOTE — Patient Instructions (Signed)
AVS info given by phone. No MyChart access 

## 2018-12-13 ENCOUNTER — Other Ambulatory Visit: Payer: Self-pay

## 2018-12-13 ENCOUNTER — Other Ambulatory Visit
Admission: RE | Admit: 2018-12-13 | Discharge: 2018-12-13 | Disposition: A | Discharge status: Home / Self Care | Payer: Medicare Other | Source: Ambulatory Visit | Attending: Pain Medicine | Admitting: Pain Medicine

## 2018-12-13 DIAGNOSIS — Z1159 Encounter for screening for other viral diseases: Secondary | ICD-10-CM | POA: Diagnosis present

## 2018-12-14 LAB — NOVEL CORONAVIRUS, NAA (HOSP ORDER, SEND-OUT TO REF LAB; TAT 18-24 HRS): SARS-CoV-2, NAA: NOT DETECTED

## 2018-12-17 ENCOUNTER — Other Ambulatory Visit: Payer: Self-pay

## 2018-12-17 ENCOUNTER — Encounter: Payer: Self-pay | Admitting: Pain Medicine

## 2018-12-17 ENCOUNTER — Ambulatory Visit: Payer: Medicare Other | Attending: Pain Medicine | Admitting: Pain Medicine

## 2018-12-17 VITALS — BP 148/69 | HR 78 | Temp 97.7°F | Resp 20 | Ht 70.0 in | Wt 250.0 lb

## 2018-12-17 DIAGNOSIS — M1712 Unilateral primary osteoarthritis, left knee: Secondary | ICD-10-CM | POA: Diagnosis not present

## 2018-12-17 DIAGNOSIS — M150 Primary generalized (osteo)arthritis: Secondary | ICD-10-CM | POA: Diagnosis present

## 2018-12-17 DIAGNOSIS — M170 Bilateral primary osteoarthritis of knee: Secondary | ICD-10-CM | POA: Insufficient documentation

## 2018-12-17 DIAGNOSIS — M25561 Pain in right knee: Secondary | ICD-10-CM | POA: Diagnosis not present

## 2018-12-17 DIAGNOSIS — G894 Chronic pain syndrome: Secondary | ICD-10-CM | POA: Diagnosis present

## 2018-12-17 DIAGNOSIS — G8929 Other chronic pain: Secondary | ICD-10-CM | POA: Diagnosis present

## 2018-12-17 DIAGNOSIS — M25562 Pain in left knee: Secondary | ICD-10-CM | POA: Insufficient documentation

## 2018-12-17 DIAGNOSIS — M1711 Unilateral primary osteoarthritis, right knee: Secondary | ICD-10-CM | POA: Diagnosis present

## 2018-12-17 DIAGNOSIS — M159 Polyosteoarthritis, unspecified: Secondary | ICD-10-CM

## 2018-12-17 DIAGNOSIS — M792 Neuralgia and neuritis, unspecified: Secondary | ICD-10-CM | POA: Diagnosis present

## 2018-12-17 MED ORDER — DICLOFENAC SODIUM 1 % TD GEL: 2.0000 g | Freq: Four times a day (QID) | TRANSDERMAL | 99 refills | Status: DC

## 2018-12-17 MED ORDER — LIDOCAINE HCL 2 % IJ SOLN
20.0000 mL | Freq: Once | INTRAMUSCULAR | Status: DC
  Filled 2018-12-17: qty 200

## 2018-12-17 MED ORDER — ROPIVACAINE HCL 2 MG/ML IJ SOLN
5.0000 mL | Freq: Once | INTRAMUSCULAR | Status: AC
  Administered 2018-12-17: 10 mL via INTRA_ARTICULAR
  Filled 2018-12-17: qty 10

## 2018-12-17 MED ORDER — LIDOCAINE HCL (PF) 1 % IJ SOLN
5.0000 mL | Freq: Once | INTRAMUSCULAR | Status: AC
  Administered 2018-12-17: 11:00:00 5 mL
  Filled 2018-12-17: qty 5

## 2018-12-17 MED ORDER — SODIUM HYALURONATE (VISCOSUP) 20 MG/2ML IX SOSY
2.0000 mL | PREFILLED_SYRINGE | Freq: Once | INTRA_ARTICULAR | Status: AC
  Administered 2018-12-17: 2 mL via INTRA_ARTICULAR

## 2018-12-17 MED ORDER — SODIUM HYALURONATE (VISCOSUP) 20 MG/2ML IX SOSY
2.0000 mL | PREFILLED_SYRINGE | Freq: Once | INTRA_ARTICULAR | Status: AC
  Administered 2018-12-17: 11:00:00 via INTRA_ARTICULAR

## 2018-12-17 MED ORDER — MELOXICAM 15 MG PO TABS: 15.0000 mg | ORAL_TABLET | Freq: Every day | ORAL | 2 refills | Status: DC

## 2018-12-17 MED ORDER — PREGABALIN 150 MG PO CAPS: 150.0000 mg | ORAL_CAPSULE | Freq: Three times a day (TID) | ORAL | 2 refills | Status: DC

## 2018-12-17 MED ORDER — OXYCODONE HCL 10 MG PO TABS: 10.0000 mg | ORAL_TABLET | Freq: Three times a day (TID) | ORAL | 0 refills | Status: DC | PRN

## 2018-12-17 NOTE — Progress Notes (Signed)
Safety precautions to be maintained throughout the outpatient stay will include: orient to surroundings, keep bed in low position, maintain call bell within reach at all times, provide assistance with transfer out of bed and ambulation.  

## 2018-12-17 NOTE — Addendum Note (Signed)
Addended by: Janett Billow on: 12/17/2018 02:33 PM   Modules accepted: Orders, SmartSet

## 2018-12-17 NOTE — Therapy (Signed)
[facility] [address], [place], [address] Phone: [phone]   Fax:  [phone]  Patient Details  Name: Aaron Gibbs MRN: [id number] Date of Birth: May 18, 1935 Referring Provider:  Vevelyn Francois, NP  Encounter Date: 08/01/2018   Chart was opened by Joneen Boers in preparation for patient evaluation, patient was marked as a no show.  No charges associated with this visit.  Note run to remove chart from open chart list.   Laveda Demedeiros 12/17/2018, 9:50 AM  [facility] [address] [place], [address] Phone: [phone]   Fax:  [phone]

## 2018-12-17 NOTE — Progress Notes (Signed)
Patient's Name: Aaron Gibbs  MRN: [id number]  Referring Provider: [name] *  DOB: 1935/02/01  PCP: Olin Hauser, DO  DOS: 12/17/2018  Note by: Gaspar Cola, MD  Service setting: Ambulatory outpatient  Specialty: Interventional Pain Management  Patient type: Established  Location: [facility] (AMB) [facility]  Visit type: Interventional Procedure   Primary Reason for Visit: Interventional Pain Management Treatment. CC: Knee Pain (bilateral)  Procedure:          Anesthesia, Analgesia, Anxiolysis:  Type: Therapeutic Intra-Articular Hyalgan Knee Injection #2  Region: Lateral infrapatellar Knee Region Level: Knee Joint Laterality: Bilateral  Type: Local Anesthesia Indication(s): Analgesia         Local Anesthetic: Lidocaine 1-2% Route: Infiltration (Jette/IM) IV Access: Declined Sedation: Declined   Position: Sitting   Indications: 1. Osteoarthritis of knee (Bilateral)   2. Chronic knee pain (Primary Area of Pain) (Bilateral) (L>R)   3. Tricompartment osteoarthritis of knee (Left)   4. Osteoarthritis of patellofemoral joint (Right)    Pain Score: Pre-procedure: 7 /10 Post-procedure: 7 /10  Pharmacotherapy Assessment  Analgesic: Oxycodone 10 mg 1 tablet p.o. every 8 hours (30 mg/day of oxycodone) MME/day: 45 mg/day.  Monitoring: Pharmacotherapy: No side-effects or adverse reactions reported. [place] PMP: PDMP reviewed during this encounter.       Compliance: No problems identified. Effectiveness: Clinically acceptable. Plan: Refer to "POC".  Post-Procedure Evaluation  Procedure: Therapeutic bilateral intra-articular Hyalgan knee injection #1 Pre-procedure pain level:  7/10 Post-procedure: 7/10 No initial benefit, possibly due to rapid discharge after no sedation procedure, without enough time to allow full onset of block.  Sedation: None.  Effectiveness during initial hour after procedure(Ultra-Short Term Relief): 30 %   Local  anesthetic used: Long-acting (4-6 hours) Effectiveness: Defined as any analgesic benefit obtained secondary to the administration of local anesthetics. This carries significant diagnostic value as to the etiological location, or anatomical origin, of the pain. Duration of benefit is expected to coincide with the duration of the local anesthetic used.  Effectiveness during initial 4-6 hours after procedure(Short-Term Relief): 30 %   Long-term benefit: Defined as any relief past the pharmacologic duration of the local anesthetics.  Effectiveness past the initial 6 hours after procedure(Long-Term Relief): 30 %("Its difficult to tell")   Current benefits: Defined as benefit that persist at this time.   Analgesia:  <50% better Function: Somewhat improved ROM: Somewhat improved  Pre-op Assessment:  [name] is a 84 y.o. (year old), male patient, seen today for interventional treatment. He  has a past surgical history that includes Cholecystectomy and Transurethral resection of prostate (N/A, 2008). [name] has a current medication list which includes the following prescription(s): amlodipine, aspirin ec, diclofenac sodium, duloxetine, fenofibrate, ferrous sulfate, hydrochlorothiazide, meloxicam, multivitamin with minerals, oxycodone hcl, pantoprazole, polyethylene glycol, prednisolone acetate, quetiapine, timolol, umeclidinium-vilanterol, duloxetine, levofloxacin, prednisone, pregabalin, quetiapine, and venlafaxine xr, and the following Facility-Administered Medications: lidocaine. His primarily concern today is the Knee Pain (bilateral)  Initial Vital Signs:  Pulse/HCG Rate: 76  Temp: 97.7 F (36.5 C) Resp: 20 BP: (!) 148/69 SpO2: 95 %  BMI: Estimated body mass index is 35.87 kg/m as calculated from the following:   Height as of this encounter: 5\' 10"  (1.778 m).   Weight as of this encounter: 250 lb (113.4 kg).  Risk Assessment: Allergies: Reviewed. He is allergic to azithromycin.   Allergy Precautions: None required Coagulopathies: Reviewed. None identified.  Blood-thinner therapy: None at this time Active Infection(s): Reviewed. None identified. [name] is afebrile  Site Confirmation: [name] was asked to confirm the procedure and laterality before marking the site Procedure checklist: Completed Consent: Before the procedure and under the influence of no sedative(s), amnesic(s), or anxiolytics, the patient was informed of the treatment options, risks and possible complications. To fulfill our ethical and legal obligations, as recommended by the American Medical Association's Code of Ethics, I have informed the patient of my clinical impression; the nature and purpose of the treatment or procedure; the risks, benefits, and possible complications of the intervention; the alternatives, including doing nothing; the risk(s) and benefit(s) of the alternative treatment(s) or procedure(s); and the risk(s) and benefit(s) of doing nothing. The patient was provided information about the general risks and possible complications associated with the procedure. These may include, but are not limited to: failure to achieve desired goals, infection, bleeding, organ or nerve damage, allergic reactions, paralysis, and death. In addition, the patient was informed of those risks and complications associated to the procedure, such as failure to decrease pain; infection; bleeding; organ or nerve damage with subsequent damage to sensory, motor, and/or autonomic systems, resulting in permanent pain, numbness, and/or weakness of one or several areas of the body; allergic reactions; (i.e.: anaphylactic reaction); and/or death. Furthermore, the patient was informed of those risks and complications associated with the medications. These include, but are not limited to: allergic reactions (i.e.: anaphylactic or anaphylactoid reaction(s)); adrenal axis suppression; blood sugar elevation that in  diabetics may result in ketoacidosis or comma; water retention that in patients with history of congestive heart failure may result in shortness of breath, pulmonary edema, and decompensation with resultant heart failure; weight gain; swelling or edema; medication-induced neural toxicity; particulate matter embolism and blood vessel occlusion with resultant organ, and/or nervous system infarction; and/or aseptic necrosis of one or more joints. Finally, the patient was informed that Medicine is not an exact science; therefore, there is also the possibility of unforeseen or unpredictable risks and/or possible complications that may result in a catastrophic outcome. The patient indicated having understood very clearly. We have given the patient no guarantees and we have made no promises. Enough time was given to the patient to ask questions, all of which were answered to the patient's satisfaction. Mr. Feldhaus has indicated that he wanted to continue with the procedure. Attestation: I, the ordering provider, attest that I have discussed with the patient the benefits, risks, side-effects, alternatives, likelihood of achieving goals, and potential problems during recovery for the procedure that I have provided informed consent. Date  Time: 12/17/2018 10:09 AM  Pre-Procedure Preparation:  Monitoring: As per clinic protocol. Respiration, ETCO2, SpO2, BP, heart rate and rhythm monitor placed and checked for adequate function Safety Precautions: Patient was assessed for positional comfort and pressure points before starting the procedure. Time-out: I initiated and conducted the "Time-out" before starting the procedure, as per protocol. The patient was asked to participate by confirming the accuracy of the "Time Out" information. Verification of the correct person, site, and procedure were performed and confirmed by me, the nursing staff, and the patient. "Time-out" conducted as per Joint Commission's Universal  Protocol (UP.01.01.01). Time: 1035  Description of Procedure:          Target Area: Knee Joint Approach: Just above the Lateral tibial plateau, lateral to the infrapatellar tendon. Area Prepped: Entire knee area, from the mid-thigh to the mid-shin. Prepping solution: DuraPrep (Iodine Povacrylex [0.7% available iodine] and Isopropyl Alcohol, 74% w/w) Safety Precautions: Aspiration looking for blood return was conducted prior to all  injections. At no point did we inject any substances, as a needle was being advanced. No attempts were made at seeking any paresthesias. Safe injection practices and needle disposal techniques used. Medications properly checked for expiration dates. SDV (single dose vial) medications used. Description of the Procedure: Protocol guidelines were followed. The patient was placed in position over the fluoroscopy table. The target area was identified and the area prepped in the usual manner. Skin & deeper tissues infiltrated with local anesthetic. Appropriate amount of time allowed to pass for local anesthetics to take effect. The procedure needles were then advanced to the target area. Proper needle placement secured. Negative aspiration confirmed. Solution injected in intermittent fashion, asking for systemic symptoms every 0.5cc of injectate. The needles were then removed and the area cleansed, making sure to leave some of the prepping solution back to take advantage of its long term bactericidal properties. Vitals:   12/17/18 1007 12/17/18 1039  BP: (!) 148/69   Pulse: 76 78  Resp: 20 20  Temp: 97.7 F (36.5 C)   SpO2: 95% 95%  Weight: 250 lb (113.4 kg)   Height: 5\' 10"  (1.778 m)     Start Time: 1035 hrs. End Time: 1038 hrs. Materials:  Needle(s) Type: Regular needle Gauge: 25G Length: 1.5-in Medication(s): Please see orders for medications and dosing details.  Imaging Guidance:          Type of Imaging Technique: None used Indication(s): N/A Exposure Time:  No patient exposure Contrast: None used. Fluoroscopic Guidance: N/A Ultrasound Guidance: N/A Interpretation: N/A  Antibiotic Prophylaxis:   Anti-infectives (From admission, onward)   None     Indication(s): None identified  Post-operative Assessment:  Post-procedure Vital Signs:  Pulse/HCG Rate: 78  Temp: 97.7 F (36.5 C) Resp: 20 BP: (!) 148/69 SpO2: 95 %  EBL: None  Complications: No immediate post-treatment complications observed by team, or reported by patient.  Note: The patient tolerated the entire procedure well. A repeat set of vitals were taken after the procedure and the patient was kept under observation following institutional policy, for this type of procedure. Post-procedural neurological assessment was performed, showing return to baseline, prior to discharge. The patient was provided with post-procedure discharge instructions, including a section on how to identify potential problems. Should any problems arise concerning this procedure, the patient was given instructions to immediately contact us, at any time, without hesitation. In any case, we plan to contact the patient by telephone for a follow-up status report regarding this interventional procedure.  Comments:  No additional relevant information.  Plan of Care  Orders:  Orders Placed This Encounter  Procedures  . KNEE INJECTION    Hyalgan knee injection to be done by MD.    Scheduling Instructions:     Procedure: Intra-articular Hyalgan Knee injection #2     Side(s): Bilateral Knee     Sedation: None     Timeframe: Today    Order Specific Question:   Where will this procedure be performed?    Answer:   [facility]  . KNEE INJECTION    Hyalgan knee injection. Please order Hyalgan.    Standing Status:   Future    Standing Expiration Date:   01/16/2019    Scheduling Instructions:     Procedure: Intra-articular Hyalgan Knee injection #3     Side: Bilateral     Sedation: None     Timeframe:  in two (2) weeks    Order Specific Question:   Where will this procedure  be performed?    Answer:   [facility]  . Provider attestation of informed consent for procedure/surgical case    I, the ordering provider, attest that I have discussed with the patient the benefits, risks, side effects, alternatives, likelihood of achieving goals and potential problems during recovery for the procedure that I have provided informed consent.    Standing Status:   Standing    Number of Occurrences:   1  . Informed Consent Details: Transcribe to consent form and obtain patient signature    Standing Status:   Standing    Number of Occurrences:   1    Order Specific Question:   Procedure    Answer:   Bilateral intra-articular viscosupplementation knee injection with Hyalgan    Order Specific Question:   Surgeon    Answer:   Shirlean Berman A. Dossie Arbour, MD    Order Specific Question:   Indication/Reason    Answer:   Bilateral knee osteoarthritis and arthralgia   Medications ordered for procedure: Meds ordered this encounter  Medications  . lidocaine (XYLOCAINE) 2 % (with pres) injection 400 mg  . lidocaine (PF) (XYLOCAINE) 1 % injection 5 mL  . ropivacaine (PF) 2 mg/mL (0.2%) (NAROPIN) injection 5 mL  . Sodium Hyaluronate SOSY 2 mL  . Sodium Hyaluronate SOSY 2 mL  . diclofenac sodium (VOLTAREN) 1 % GEL    Sig: Apply 2 g topically 4 (four) times daily.    Dispense:  350 g    Refill:  PRN    Fill one day early if pharmacy is closed on scheduled refill date. May substitute for generic if available.  . Oxycodone HCl 10 MG TABS    Sig: Take 1 tablet (10 mg total) by mouth 3 (three) times daily as needed. Must last 30 days.    Dispense:  90 tablet    Refill:  0    Chronic Pain. (STOP Act - Not applicable). Fill one day early if closed on scheduled refill date. Do not fill until: 12/26/2018. To last until: 01/25/2019.  . pregabalin (LYRICA) 150 MG capsule    Sig: Take 1 capsule (150 mg total) by mouth 3  (three) times daily. Must last 30 days    Dispense:  90 capsule    Refill:  2    Fill one day early if pharmacy is closed on scheduled refill date. May substitute for generic if available.  . meloxicam (MOBIC) 15 MG tablet    Sig: Take 1 tablet (15 mg total) by mouth daily.    Dispense:  30 tablet    Refill:  2    Fill one day early if pharmacy is closed on scheduled refill date. May substitute for generic if available.   Medications administered: We administered lidocaine (PF), ropivacaine (PF) 2 mg/mL (0.2%), Sodium Hyaluronate, and Sodium Hyaluronate.  See the medical record for exact dosing, route, and time of administration.  Disposition: Discharge home  Discharge Date & Time: 12/17/2018; 1041 hrs.   Follow-up plan:   Return in about 2 weeks (around 12/31/2018) for Procedure (no sedation): (B) Hyalgan #3.     Recent Visits Date Type Provider Dept  11/21/18 Procedure visit Milinda Pointer, MD [facility]  11/18/18 Office Visit Milinda Pointer, MD [facility]  10/23/18 Office Visit Milinda Pointer, MD [facility]  09/25/18 Office Visit Milinda Pointer, MD [facility]  Showing recent visits within past 90 days and meeting all other requirements   Today's Visits Date Type Provider  Dept  12/17/18 Procedure visit Milinda Pointer, MD [facility]  Showing today's visits and meeting all other requirements   Future Appointments Date Type Provider Dept  12/31/18 Appointment Milinda Pointer, MD [facility]  Showing future appointments within next 90 days and meeting all other requirements   Primary Care Physician: Olin Hauser, DO Location: [facility] Note by: Gaspar Cola, MD Date: 12/17/2018; Time: 11:02 AM  Disclaimer:  Medicine is not an Chief Strategy Officer. The only guarantee in medicine is that nothing is guaranteed. It is important to note that the decision  to proceed with this intervention was based on the information collected from the patient. The Data and conclusions were drawn from the patient's questionnaire, the interview, and the physical examination. Because the information was provided in large part by the patient, it cannot be guaranteed that it has not been purposely or unconsciously manipulated. Every effort has been made to obtain as much relevant data as possible for this evaluation. It is important to note that the conclusions that lead to this procedure are derived in large part from the available data. Always take into account that the treatment will also be dependent on availability of resources and existing treatment guidelines, considered by other Pain Management Practitioners as being common knowledge and practice, at the time of the intervention. For Medico-Legal purposes, it is also important to point out that variation in procedural techniques and pharmacological choices are the acceptable norm. The indications, contraindications, technique, and results of the above procedure should only be interpreted and judged by a Board-Certified Interventional Pain Specialist with extensive familiarity and expertise in the same exact procedure and technique.

## 2018-12-17 NOTE — Patient Instructions (Addendum)

## 2018-12-18 ENCOUNTER — Telehealth: Payer: Self-pay

## 2018-12-18 ENCOUNTER — Ambulatory Visit: Payer: Medicare Other | Admitting: Pain Medicine

## 2018-12-18 NOTE — Telephone Encounter (Signed)
Post procedure phone call.  Patient states he is doing good.  

## 2018-12-27 ENCOUNTER — Other Ambulatory Visit: Payer: Self-pay

## 2018-12-27 ENCOUNTER — Other Ambulatory Visit
Admission: RE | Admit: 2018-12-27 | Discharge: 2018-12-27 | Disposition: A | Discharge status: Home / Self Care | Payer: Medicare Other | Source: Ambulatory Visit | Attending: Pain Medicine | Admitting: Pain Medicine

## 2018-12-27 DIAGNOSIS — Z01812 Encounter for preprocedural laboratory examination: Secondary | ICD-10-CM | POA: Insufficient documentation

## 2018-12-27 DIAGNOSIS — Z20828 Contact with and (suspected) exposure to other viral communicable diseases: Secondary | ICD-10-CM | POA: Insufficient documentation

## 2018-12-27 LAB — SARS CORONAVIRUS 2 (TAT 6-24 HRS): SARS Coronavirus 2: NEGATIVE

## 2018-12-31 ENCOUNTER — Encounter: Payer: Self-pay | Admitting: Pain Medicine

## 2018-12-31 ENCOUNTER — Ambulatory Visit: Payer: Medicare Other | Attending: Pain Medicine | Admitting: Pain Medicine

## 2018-12-31 ENCOUNTER — Other Ambulatory Visit: Payer: Self-pay

## 2018-12-31 VITALS — BP 151/82 | HR 84 | Temp 98.1°F | Resp 16 | Ht 71.0 in | Wt 250.0 lb

## 2018-12-31 DIAGNOSIS — M1712 Unilateral primary osteoarthritis, left knee: Secondary | ICD-10-CM | POA: Diagnosis present

## 2018-12-31 DIAGNOSIS — G894 Chronic pain syndrome: Secondary | ICD-10-CM | POA: Insufficient documentation

## 2018-12-31 DIAGNOSIS — M170 Bilateral primary osteoarthritis of knee: Secondary | ICD-10-CM | POA: Insufficient documentation

## 2018-12-31 DIAGNOSIS — M25561 Pain in right knee: Secondary | ICD-10-CM | POA: Diagnosis present

## 2018-12-31 DIAGNOSIS — G8929 Other chronic pain: Secondary | ICD-10-CM | POA: Insufficient documentation

## 2018-12-31 DIAGNOSIS — M25562 Pain in left knee: Secondary | ICD-10-CM

## 2018-12-31 DIAGNOSIS — M1711 Unilateral primary osteoarthritis, right knee: Secondary | ICD-10-CM | POA: Insufficient documentation

## 2018-12-31 MED ORDER — OXYCODONE HCL 10 MG PO TABS: 10.0000 mg | ORAL_TABLET | Freq: Three times a day (TID) | ORAL | 0 refills | Status: DC | PRN

## 2018-12-31 MED ORDER — ROPIVACAINE HCL 2 MG/ML IJ SOLN
5.0000 mL | Freq: Once | INTRAMUSCULAR | Status: AC
  Administered 2018-12-31: 11:00:00 5 mL via INTRA_ARTICULAR
  Filled 2018-12-31: qty 10

## 2018-12-31 MED ORDER — LIDOCAINE HCL (PF) 1 % IJ SOLN
5.0000 mL | Freq: Once | INTRAMUSCULAR | Status: AC
  Administered 2018-12-31: 1 mL
  Filled 2018-12-31: qty 5

## 2018-12-31 MED ORDER — SODIUM HYALURONATE (VISCOSUP) 20 MG/2ML IX SOSY
2.0000 mL | PREFILLED_SYRINGE | Freq: Once | INTRA_ARTICULAR | Status: AC
  Administered 2018-12-31: 2 mL via INTRA_ARTICULAR

## 2018-12-31 NOTE — Progress Notes (Signed)
Patient's Name: Ananda Sitzer  MRN: [id number]  Referring Provider: [name] *  DOB: 03-07-1935  PCP: Olin Hauser, DO  DOS: 12/31/2018  Note by: Gaspar Cola, MD  Service setting: Ambulatory outpatient  Specialty: Interventional Pain Management  Patient type: Established  Location: [facility] (AMB) Pain Management Facility  Visit type: Interventional Procedure   Primary Reason for Visit: Interventional Pain Management Treatment. CC: Pain (Bilateral Knee pain)  Procedure:          Anesthesia, Analgesia, Anxiolysis:  Type: Therapeutic Intra-Articular Hyalgan Knee Injection #3  Region: Lateral infrapatellar Knee Region Level: Knee Joint Laterality: Bilateral  Type: Local Anesthesia Indication(s): Analgesia         Local Anesthetic: Lidocaine 1-2% Route: Infiltration ([place]/IM) IV Access: Declined Sedation: Declined   Position: Sitting   Indications: 1. Chronic knee pain (Primary Area of Pain) (Bilateral) (L>R)   2. Osteoarthritis of knee (Bilateral)   3. Osteoarthritis of patellofemoral joint (Right)   4. Tricompartment osteoarthritis of knee (Left)    Pain Score: Pre-procedure: 7 /10 Post-procedure: 6 /10  Pertinent Labs  COVID-19 screennig: Lab Results  Component Value Date   SARSCOV2NAA NEGATIVE 12/27/2018   Pre-op Assessment:  Mr. Diantonio is a 84 y.o. (year old), male patient, seen today for interventional treatment. He  has a past surgical history that includes Cholecystectomy and Transurethral resection of prostate (N/A, 2008). Mr. Klug has a current medication list which includes the following prescription(s): amlodipine, aspirin ec, diclofenac sodium, duloxetine, fenofibrate, ferrous sulfate, hydrochlorothiazide, meloxicam, multivitamin with minerals, oxycodone hcl, pantoprazole, polyethylene glycol, prednisolone acetate, prednisone, pregabalin, quetiapine, quetiapine, timolol, umeclidinium-vilanterol, venlafaxine xr, duloxetine, and  levofloxacin. His primarily concern today is the Pain (Bilateral Knee pain)  Initial Vital Signs:  Pulse/HCG Rate: 86  Temp: 98.1 F (36.7 C) Resp: 20 BP: (!) 147/80 SpO2: 97 %  BMI: Estimated body mass index is 34.87 kg/m as calculated from the following:   Height as of this encounter: 5\' 11"  (1.803 m).   Weight as of this encounter: 250 lb (113.4 kg).  Risk Assessment: Allergies: Reviewed. He is allergic to azithromycin.  Allergy Precautions: None required Coagulopathies: Reviewed. None identified.  Blood-thinner therapy: None at this time Active Infection(s): Reviewed. None identified. Mr. Ferrell is afebrile  Site Confirmation: Mr. Diffee was asked to confirm the procedure and laterality before marking the site Procedure checklist: Completed Consent: Before the procedure and under the influence of no sedative(s), amnesic(s), or anxiolytics, the patient was informed of the treatment options, risks and possible complications. To fulfill our ethical and legal obligations, as recommended by the American Medical Association's Code of Ethics, I have informed the patient of my clinical impression; the nature and purpose of the treatment or procedure; the risks, benefits, and possible complications of the intervention; the alternatives, including doing nothing; the risk(s) and benefit(s) of the alternative treatment(s) or procedure(s); and the risk(s) and benefit(s) of doing nothing. The patient was provided information about the general risks and possible complications associated with the procedure. These may include, but are not limited to: failure to achieve desired goals, infection, bleeding, organ or nerve damage, allergic reactions, paralysis, and death. In addition, the patient was informed of those risks and complications associated to the procedure, such as failure to decrease pain; infection; bleeding; organ or nerve damage with subsequent damage to sensory, motor, and/or autonomic  systems, resulting in permanent pain, numbness, and/or weakness of one or several areas of the body; allergic reactions; (i.e.: anaphylactic reaction); and/or death. Furthermore, the patient  was informed of those risks and complications associated with the medications. These include, but are not limited to: allergic reactions (i.e.: anaphylactic or anaphylactoid reaction(s)); adrenal axis suppression; blood sugar elevation that in diabetics may result in ketoacidosis or comma; water retention that in patients with history of congestive heart failure may result in shortness of breath, pulmonary edema, and decompensation with resultant heart failure; weight gain; swelling or edema; medication-induced neural toxicity; particulate matter embolism and blood vessel occlusion with resultant organ, and/or nervous system infarction; and/or aseptic necrosis of one or more joints. Finally, the patient was informed that Medicine is not an exact science; therefore, there is also the possibility of unforeseen or unpredictable risks and/or possible complications that may result in a catastrophic outcome. The patient indicated having understood very clearly. We have given the patient no guarantees and we have made no promises. Enough time was given to the patient to ask questions, all of which were answered to the patient's satisfaction. Mr. Allende has indicated that he wanted to continue with the procedure. Attestation: I, the ordering provider, attest that I have discussed with the patient the benefits, risks, side-effects, alternatives, likelihood of achieving goals, and potential problems during recovery for the procedure that I have provided informed consent. Date  Time: 12/31/2018 10:08 AM  Pre-Procedure Preparation:  Monitoring: As per clinic protocol. Respiration, ETCO2, SpO2, BP, heart rate and rhythm monitor placed and checked for adequate function Safety Precautions: Patient was assessed for positional comfort  and pressure points before starting the procedure. Time-out: I initiated and conducted the "Time-out" before starting the procedure, as per protocol. The patient was asked to participate by confirming the accuracy of the "Time Out" information. Verification of the correct person, site, and procedure were performed and confirmed by me, the nursing staff, and the patient. "Time-out" conducted as per Joint Commission's Universal Protocol (UP.01.01.01). Time: 1025  Description of Procedure:          Target Area: Knee Joint Approach: Just above the Lateral tibial plateau, lateral to the infrapatellar tendon. Area Prepped: Entire knee area, from the mid-thigh to the mid-shin. Prepping solution: DuraPrep (Iodine Povacrylex [0.7% available iodine] and Isopropyl Alcohol, 74% w/w) Safety Precautions: Aspiration looking for blood return was conducted prior to all injections. At no point did we inject any substances, as a needle was being advanced. No attempts were made at seeking any paresthesias. Safe injection practices and needle disposal techniques used. Medications properly checked for expiration dates. SDV (single dose vial) medications used. Description of the Procedure: Protocol guidelines were followed. The patient was placed in position over the fluoroscopy table. The target area was identified and the area prepped in the usual manner. Skin & deeper tissues infiltrated with local anesthetic. Appropriate amount of time allowed to pass for local anesthetics to take effect. The procedure needles were then advanced to the target area. Proper needle placement secured. Negative aspiration confirmed. Solution injected in intermittent fashion, asking for systemic symptoms every 0.5cc of injectate. The needles were then removed and the area cleansed, making sure to leave some of the prepping solution back to take advantage of its long term bactericidal properties. Vitals:   12/31/18 1003 12/31/18 1033  BP: (!)  147/80 (!) 151/82  Pulse: 86 84  Resp: 20 16  Temp: 98.1 F (36.7 C)   TempSrc: Oral   SpO2: 97% 96%  Weight: 250 lb (113.4 kg)   Height: 5\' 11"  (1.803 m)     Start Time: 1025 hrs. End Time: 1032  hrs. Materials:  Needle(s) Type: Regular needle Gauge: 25G Length: 1.5-in Medication(s): Please see orders for medications and dosing details.  Imaging Guidance:          Type of Imaging Technique: None used Indication(s): N/A Exposure Time: No patient exposure Contrast: None used. Fluoroscopic Guidance: N/A Ultrasound Guidance: N/A Interpretation: N/A  Antibiotic Prophylaxis:   Anti-infectives (From admission, onward)   None     Indication(s): None identified  Post-operative Assessment:  Post-procedure Vital Signs:  Pulse/HCG Rate: 84  Temp: 98.1 F (36.7 C) Resp: 16 BP: (!) 151/82 SpO2: 96 %  EBL: None  Complications: No immediate post-treatment complications observed by team, or reported by patient.  Note: The patient tolerated the entire procedure well. A repeat set of vitals were taken after the procedure and the patient was kept under observation following institutional policy, for this type of procedure. Post-procedural neurological assessment was performed, showing return to baseline, prior to discharge. The patient was provided with post-procedure discharge instructions, including a section on how to identify potential problems. Should any problems arise concerning this procedure, the patient was given instructions to immediately contact us, at any time, without hesitation. In any case, we plan to contact the patient by telephone for a follow-up status report regarding this interventional procedure.  Comments:  No additional relevant information.  Plan of Care  Orders:  Orders Placed This Encounter  Procedures  . KNEE INJECTION    Hyalgan knee injection to be done by MD.    Scheduling Instructions:     Procedure: Intra-articular Hyalgan Knee injection #3      Side(s): Bilateral Knee     Sedation: None     Timeframe: Today    Order Specific Question:   Where will this procedure be performed?    Answer:   [facility]  . KNEE INJECTION    Hyalgan knee injection. Please order Hyalgan.    Standing Status:   Future    Standing Expiration Date:   01/31/2019    Scheduling Instructions:     Procedure: Intra-articular Hyalgan Knee injection #4     Side: Bilateral     Sedation: None     Timeframe: in two (2) weeks    Order Specific Question:   Where will this procedure be performed?    Answer:   [facility]  . Provider attestation of informed consent for procedure/surgical case    I, the ordering provider, attest that I have discussed with the patient the benefits, risks, side effects, alternatives, likelihood of achieving goals and potential problems during recovery for the procedure that I have provided informed consent.    Standing Status:   Standing    Number of Occurrences:   1  . Informed Consent Details: Transcribe to consent form and obtain patient signature    Standing Status:   Standing    Number of Occurrences:   1    Order Specific Question:   Procedure    Answer:   Bilateral intra-articular viscosupplementation knee injection with Hyalgan    Order Specific Question:   Surgeon    Answer:   Aryahna Spagna A. Dossie Arbour, MD    Order Specific Question:   Indication/Reason    Answer:   Bilateral knee osteoarthritis and arthralgia   Chronic Opioid Analgesic:  Oxycodone IR 10 mg, 1 tab PO q 8 hrs (30 mg/day of oxycodone) (has enough to last until 02/24/2019) MME/day: 45 mg/day.   Medications ordered for procedure: Meds ordered this encounter  Medications  .  lidocaine (PF) (XYLOCAINE) 1 % injection 5 mL  . ropivacaine (PF) 2 mg/mL (0.2%) (NAROPIN) injection 5 mL  . Sodium Hyaluronate SOSY 2 mL  . Sodium Hyaluronate SOSY 2 mL  . Oxycodone HCl 10 MG TABS    Sig: Take 1 tablet (10 mg total) by mouth 3 (three) times daily as  needed. Must last 30 days.    Dispense:  90 tablet    Refill:  0    Chronic Pain. (STOP Act - Not applicable). Fill one day early if closed on scheduled refill date. Do not fill until: 01/25/2019. To last until: 02/24/2019.   Medications administered: We administered lidocaine (PF), ropivacaine (PF) 2 mg/mL (0.2%), Sodium Hyaluronate, and Sodium Hyaluronate.  See the medical record for exact dosing, route, and time of administration.  Follow-up plan:   Return in about 2 weeks (around 01/14/2019) for Procedure (no sedation): (B) Hyalgan #4.       Interventional management options: Considering: Diagnostic bilateral genicular nerve block #1 Possible bilateral genicular nerve RFA   PRN Procedures: Palliative bilateral intra-articular Hyalgan knee injections S2/N4  Palliative bilateral intra-articular knee injections (w/ steroids) #1     Recent Visits Date Type Provider Dept  12/17/18 Procedure visit Milinda Pointer, MD [facility]  11/21/18 Procedure visit Milinda Pointer, MD [facility]  11/18/18 Office Visit Milinda Pointer, MD [facility]  10/23/18 Office Visit Milinda Pointer, MD [facility]  Showing recent visits within past 90 days and meeting all other requirements   Today's Visits Date Type Provider Dept  12/31/18 Procedure visit Milinda Pointer, MD [facility]  Showing today's visits and meeting all other requirements   Future Appointments No visits were found meeting these conditions.  Showing future appointments within next 90 days and meeting all other requirements   Disposition: Discharge home  Discharge Date & Time: 12/31/2018; 1033 hrs.   Primary Care Physician: Olin Hauser, DO Location: [facility] Note by: Gaspar Cola, MD Date: 12/31/2018; Time: 10:37 AM  Disclaimer:  Medicine is not an exact science. The only guarantee in medicine is that  nothing is guaranteed. It is important to note that the decision to proceed with this intervention was based on the information collected from the patient. The Data and conclusions were drawn from the patient's questionnaire, the interview, and the physical examination. Because the information was provided in large part by the patient, it cannot be guaranteed that it has not been purposely or unconsciously manipulated. Every effort has been made to obtain as much relevant data as possible for this evaluation. It is important to note that the conclusions that lead to this procedure are derived in large part from the available data. Always take into account that the treatment will also be dependent on availability of resources and existing treatment guidelines, considered by other Pain Management Practitioners as being common knowledge and practice, at the time of the intervention. For Medico-Legal purposes, it is also important to point out that variation in procedural techniques and pharmacological choices are the acceptable norm. The indications, contraindications, technique, and results of the above procedure should only be interpreted and judged by a Board-Certified Interventional Pain Specialist with extensive familiarity and expertise in the same exact procedure and technique.

## 2018-12-31 NOTE — Patient Instructions (Signed)
____________________________________________________________________________________________  Post-Procedure Discharge Instructions  Instructions:  Apply ice:   Purpose: This will minimize any swelling and discomfort after procedure.   When: Day of procedure, as soon as you get home.  How: Fill a plastic sandwich bag with crushed ice. Cover it with a small towel and apply to injection site.  How long: (15 min on, 15 min off) Apply for 15 minutes then remove x 15 minutes.  Repeat sequence on day of procedure, until you go to bed.  Apply heat:   Purpose: To treat any soreness and discomfort from the procedure.  When: Starting the next day after the procedure.  How: Apply heat to procedure site starting the day following the procedure.  How long: May continue to repeat daily, until discomfort goes away.  Food intake: Start with clear liquids (like water) and advance to regular food, as tolerated.   Physical activities: Keep activities to a minimum for the first 8 hours after the procedure. After that, then as tolerated.  Driving: If you have received any sedation, be responsible and do not drive. You are not allowed to drive for 24 hours after having sedation.  Blood thinner: (Applies only to those taking blood thinners) You may restart your blood thinner 6 hours after your procedure.  Insulin: (Applies only to Diabetic patients taking insulin) As soon as you can eat, you may resume your normal dosing schedule.  Infection prevention: Keep procedure site clean and dry. Shower daily and clean area with soap and water.  Post-procedure Pain Diary: Extremely important that this be done correctly and accurately. Recorded information will be used to determine the next step in treatment. For the purpose of accuracy, follow these rules:  Evaluate only the area treated. Do not report or include pain from an untreated area. For the purpose of this evaluation, ignore all other areas of pain,  except for the treated area.  After your procedure, avoid taking a long nap and attempting to complete the pain diary after you wake up. Instead, set your alarm clock to go off every hour, on the hour, for the initial 8 hours after the procedure. Document the duration of the numbing medicine, and the relief you are getting from it.  Do not go to sleep and attempt to complete it later. It will not be accurate. If you received sedation, it is likely that you were given a medication that may cause amnesia. Because of this, completing the diary at a later time may cause the information to be inaccurate. This information is needed to plan your care.  Follow-up appointment: Keep your post-procedure follow-up evaluation appointment after the procedure (usually 2 weeks for most procedures, 6 weeks for radiofrequencies). DO NOT FORGET to bring you pain diary with you.   Expect: (What should I expect to see with my procedure?)  From numbing medicine (AKA: Local Anesthetics): Numbness or decrease in pain. You may also experience some weakness, which if present, could last for the duration of the local anesthetic.  Onset: Full effect within 15 minutes of injected.  Duration: It will depend on the type of local anesthetic used. On the average, 1 to 8 hours.   From steroids (Applies only if steroids were used): Decrease in swelling or inflammation. Once inflammation is improved, relief of the pain will follow.  Onset of benefits: Depends on the amount of swelling present. The more swelling, the longer it will take for the benefits to be seen. In some cases, up to 10 days.    Duration: Steroids will stay in the system x 2 weeks. Duration of benefits will depend on multiple posibilities including persistent irritating factors.  Side-effects: If present, they may typically last 2 weeks (the duration of the steroids).  Frequent: Cramps (if they occur, drink Gatorade and take over-the-counter Magnesium 450-500 mg  once to twice a day); water retention with temporary weight gain; increases in blood sugar; decreased immune system response; increased appetite.  Occasional: Facial flushing (red, warm cheeks); mood swings; menstrual changes.  Uncommon: Long-term decrease or suppression of natural hormones; bone thinning. (These are more common with higher doses or more frequent use. This is why we prefer that our patients avoid having any injection therapies in other practices.)   Very Rare: Severe mood changes; psychosis; aseptic necrosis.  From procedure: Some discomfort is to be expected once the numbing medicine wears off. This should be minimal if ice and heat are applied as instructed.  Call if: (When should I call?)  You experience numbness and weakness that gets worse with time, as opposed to wearing off.  New onset bowel or bladder incontinence. (Applies only to procedures done in the spine)  Emergency Numbers:  Durning business hours (Monday - Thursday, 8:00 AM - 4:00 PM) (Friday, 9:00 AM - 12:00 Noon): [phone]  After hours: [phone]  NOTE: If you are having a problem and are unable connect with, or to talk to a provider, then go to your nearest urgent care or emergency department. If the problem is serious and urgent, please call 911. ____________________________________________________________________________________________   ____________________________________________________________________________________________  Preparing for your procedure (without sedation)  Procedure appointments are limited to planned procedures: . No Prescription Refills. . No disability issues will be discussed. . No medication changes will be discussed.  Instructions: . Oral Intake: Do not eat or drink anything for at least 3 hours prior to your procedure. . Transportation: Unless otherwise stated by your physician, you may drive yourself after the procedure. . Blood Pressure Medicine:  Take your blood pressure medicine with a sip of water the morning of the procedure. . Blood thinners: Notify our staff if you are taking any blood thinners. Depending on which one you take, there will be specific instructions on how and when to stop it. . Diabetics on insulin: Notify the staff so that you can be scheduled 1st case in the morning. If your diabetes requires high dose insulin, take only  of your normal insulin dose the morning of the procedure and notify the staff that you have done so. . Preventing infections: Shower with an antibacterial soap the morning of your procedure.  . Build-up your immune system: Take 1000 mg of Vitamin C with every meal (3 times a day) the day prior to your procedure. . Antibiotics: Inform the staff if you have a condition or reason that requires you to take antibiotics before dental procedures. . Pregnancy: If you are pregnant, call and cancel the procedure. . Sickness: If you have a cold, fever, or any active infections, call and cancel the procedure. . Arrival: You must be in the facility at least 30 minutes prior to your scheduled procedure. . Children: Do not bring any children with you. . Dress appropriately: Bring dark clothing that you would not mind if they get stained. . Valuables: Do not bring any jewelry or valuables.  Reasons to call and reschedule or cancel your procedure: (Following these recommendations will minimize the risk of a serious complication.) . Surgeries: Avoid having procedures within 2 weeks   of any surgery. (Avoid for 2 weeks before or after any surgery). . Flu Shots: Avoid having procedures within 2 weeks of a flu shots or . (Avoid for 2 weeks before or after immunizations). . Barium: Avoid having a procedure within 7-10 days after having had a radiological study involving the use of radiological contrast. (Myelograms, Barium swallow or enema study). . Heart attacks: Avoid any elective procedures or surgeries for the initial 6  months after a "Myocardial Infarction" (Heart Attack). . Blood thinners: It is imperative that you stop these medications before procedures. Let us know if you if you take any blood thinner.  . Infection: Avoid procedures during or within two weeks of an infection (including chest colds or gastrointestinal problems). Symptoms associated with infections include: Localized redness, fever, chills, night sweats or profuse sweating, burning sensation when voiding, cough, congestion, stuffiness, runny nose, sore throat, diarrhea, nausea, vomiting, cold or Flu symptoms, recent or current infections. It is specially important if the infection is over the area that we intend to treat. . Heart and lung problems: Symptoms that may suggest an active cardiopulmonary problem include: cough, chest pain, breathing difficulties or shortness of breath, dizziness, ankle swelling, uncontrolled high or unusually low blood pressure, and/or palpitations. If you are experiencing any of these symptoms, cancel your procedure and contact your primary care physician for an evaluation.  Remember:  Regular Business hours are:  Monday to Thursday 8:00 AM to 4:00 PM  Provider's Schedule: Doraine Schexnider, MD:  Procedure days: Tuesday and Thursday 7:30 AM to 4:00 PM  Bilal Lateef, MD:  Procedure days: Monday and Wednesday 7:30 AM to 4:00 PM ____________________________________________________________________________________________     

## 2019-01-01 ENCOUNTER — Telehealth: Payer: Self-pay

## 2019-01-01 NOTE — Telephone Encounter (Signed)
Pt was called and left message on answering service.

## 2019-01-02 ENCOUNTER — Encounter: Payer: Self-pay | Admitting: Emergency Medicine

## 2019-01-02 ENCOUNTER — Emergency Department
Admission: EM | Admit: 2019-01-02 | Discharge: 2019-01-02 | Disposition: A | Discharge status: Home / Self Care | Payer: Medicare Other | Attending: Emergency Medicine | Admitting: Emergency Medicine

## 2019-01-02 ENCOUNTER — Other Ambulatory Visit: Payer: Self-pay

## 2019-01-02 ENCOUNTER — Emergency Department: Payer: Medicare Other

## 2019-01-02 DIAGNOSIS — Z79899 Other long term (current) drug therapy: Secondary | ICD-10-CM | POA: Insufficient documentation

## 2019-01-02 DIAGNOSIS — R0602 Shortness of breath: Secondary | ICD-10-CM | POA: Insufficient documentation

## 2019-01-02 DIAGNOSIS — J441 Chronic obstructive pulmonary disease with (acute) exacerbation: Secondary | ICD-10-CM | POA: Diagnosis not present

## 2019-01-02 DIAGNOSIS — Z7982 Long term (current) use of aspirin: Secondary | ICD-10-CM | POA: Insufficient documentation

## 2019-01-02 DIAGNOSIS — Z20828 Contact with and (suspected) exposure to other viral communicable diseases: Secondary | ICD-10-CM | POA: Insufficient documentation

## 2019-01-02 DIAGNOSIS — F17220 Nicotine dependence, chewing tobacco, uncomplicated: Secondary | ICD-10-CM | POA: Diagnosis not present

## 2019-01-02 LAB — BASIC METABOLIC PANEL
Anion gap: 12 (ref 5–15)
BUN: 19 mg/dL (ref 8–23)
CO2: 23 mmol/L (ref 22–32)
Calcium: 8.9 mg/dL (ref 8.9–10.3)
Chloride: 107 mmol/L (ref 98–111)
Creatinine, Ser: 0.97 mg/dL (ref 0.61–1.24)
GFR calc Af Amer: >60 mL/min (ref >60)
GFR calc non Af Amer: >60 mL/min (ref >60)
Glucose, Bld: 139 mg/dL — ABNORMAL HIGH (ref 70–99)
Potassium: 4.2 mmol/L (ref 3.5–5.1)
Sodium: 142 mmol/L (ref 135–145)

## 2019-01-02 LAB — CBC WITH DIFFERENTIAL/PLATELET
Abs Immature Granulocytes: 0.03 10*3/uL (ref 0.00–0.07)
Basophils Absolute: 0.1 10*3/uL (ref 0.0–0.1)
Basophils Relative: 1 %
Eosinophils Absolute: 0.4 10*3/uL (ref 0.0–0.5)
Eosinophils Relative: 6 %
HCT: 42.2 % (ref 39.0–52.0)
Hemoglobin: 13.6 g/dL (ref 13.0–17.0)
Immature Granulocytes: 1 %
Lymphocytes Relative: 18 %
Lymphs Abs: 1.1 10*3/uL (ref 0.7–4.0)
MCH: 29.6 pg (ref 26.0–34.0)
MCHC: 32.2 g/dL (ref 30.0–36.0)
MCV: 91.7 fL (ref 80.0–100.0)
Monocytes Absolute: 0.9 10*3/uL (ref 0.1–1.0)
Monocytes Relative: 16 %
Neutro Abs: 3.5 10*3/uL (ref 1.7–7.7)
Neutrophils Relative %: 58 %
Platelets: 170 10*3/uL (ref 150–400)
RBC: 4.6 MIL/uL (ref 4.22–5.81)
RDW: 16.6 % — ABNORMAL HIGH (ref 11.5–15.5)
WBC: 6 10*3/uL (ref 4.0–10.5)
nRBC: 0 % (ref 0.0–0.2)

## 2019-01-02 LAB — SARS CORONAVIRUS 2 BY RT PCR (HOSPITAL ORDER, PERFORMED IN ~~LOC~~ HOSPITAL LAB): SARS Coronavirus 2: NEGATIVE

## 2019-01-02 LAB — TROPONIN I (HIGH SENSITIVITY)
Troponin I (High Sensitivity): 25 ng/L — ABNORMAL HIGH (ref <18)
Troponin I (High Sensitivity): 24 ng/L — ABNORMAL HIGH (ref <18)

## 2019-01-02 MED ORDER — IPRATROPIUM-ALBUTEROL 0.5-2.5 (3) MG/3ML IN SOLN
3.0000 mL | Freq: Once | RESPIRATORY_TRACT | Status: AC
  Administered 2019-01-02: 20:00:00 3 mL via RESPIRATORY_TRACT
  Filled 2019-01-02: qty 3

## 2019-01-02 MED ORDER — LEVOFLOXACIN 250 MG PO TABS: 250.0000 mg | ORAL_TABLET | Freq: Every day | ORAL | 0 refills | Status: AC

## 2019-01-02 MED ORDER — METHYLPREDNISOLONE SODIUM SUCC 125 MG IJ SOLR
125.0000 mg | Freq: Once | INTRAMUSCULAR | Status: AC
  Administered 2019-01-02: 125 mg via INTRAVENOUS
  Filled 2019-01-02: qty 2

## 2019-01-02 MED ORDER — PREDNISONE 10 MG (21) PO TBPK: ORAL_TABLET | ORAL | 0 refills | Status: DC

## 2019-01-02 MED ORDER — IPRATROPIUM-ALBUTEROL 0.5-2.5 (3) MG/3ML IN SOLN
3.0000 mL | Freq: Once | RESPIRATORY_TRACT | Status: AC
  Administered 2019-01-02: 3 mL via RESPIRATORY_TRACT
  Filled 2019-01-02: qty 3

## 2019-01-02 NOTE — ED Notes (Signed)
Pt on 2L [place] sating 94%

## 2019-01-02 NOTE — ED Notes (Signed)
Pt placed back on bed. Pt resting at this time.

## 2019-01-02 NOTE — ED Notes (Signed)
Pr st he no longer feels shob at this time.

## 2019-01-02 NOTE — ED Triage Notes (Signed)
Pt from home via [facility]. Per EMS, pt has a hx of COPD. Per home health aide pt with a new onset of cough, SHOB. Pt c/o of knee pain upon arrival. VS per EMS: 97.1; 102 ST; 94% on [place] 2L; 120/78. Pt was able to stand and transferred to stretcher with 2x staff assistance and cane at bedside.

## 2019-01-02 NOTE — ED Notes (Signed)
PT sitting on bench and refuses to sit on bed. This Rn has advised pot to stay in bed. Call bell moved and within pt's arm's reach. Pt A/Ox4. Pt provided with water at this time.

## 2019-01-02 NOTE — ED Provider Notes (Signed)
[facility] Emergency Department Provider Note  ____________________________________________   I have reviewed the triage vital signs and the nursing notes.   HISTORY  Chief Complaint Shortness of Breath   History limited by: Not Limited   HPI Aaron Gibbs is a 84 y.o. male who presents to the emergency department today because of concern for shortness of breath. The patient states he has been having issues with shortness of breath for a little while but they were worse this morning. He has had an accompanied cough although did not appreciate any change this morning. He has had some accompanied chest tightness located in the central chest. Has history of COPD and tried his breathing treatment this morning without any significant relief. Denies any fevers.   Records reviewed. Per medical record review patient has a history of COPD.   Past Medical History:  Diagnosis Date  . Anxiety   . Cancer ([place])    skin  . COPD (chronic obstructive pulmonary disease) ([place])   . Glaucoma   . Osteoporosis    osteopenia    Patient Active Problem List   Diagnosis Date Noted  . Preop testing 11/14/2018  . Noncompliance with medication treatment due to overuse of medication 10/23/2018  . Neurogenic pain 08/20/2018  . Atherosclerotic peripheral vascular disease ([place]) 07/24/2018  . Tricompartment osteoarthritis of knee (Left) 07/24/2018  . Osteoarthritis of knee (Bilateral) 07/24/2018  . Osteoarthritis of patellofemoral joint (Right) 07/24/2018  . History of suicide attempt (06/12/18) 07/24/2018  . Long term current use of opiate analgesic 07/10/2018  . Pharmacologic therapy 07/10/2018  . Disorder of skeletal system 07/10/2018  . Problems influencing health status 07/10/2018  . Suicide attempt ([place]) (05/13/18) 06/26/2018  . Syncope and collapse 06/04/2018  . 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 Recurrent major depression-severe ([place]) 06/04/2018  . Blindness  05/10/2018  . Suicidal ideation 05/10/2018  . Chronic pain syndrome 05/01/2018  . DISH (diffuse idiopathic skeletal hyperostosis) 05/01/2018  . Osteoarthritis of multiple joints 05/01/2018  . Chronic knee pain (Primary Area of Pain) (Bilateral) (L>R) 05/01/2018  . Retinitis pigmentosa of both eyes 05/01/2018  . Glaucoma of both eyes 05/01/2018  . Essential hypertension 05/01/2018  . Chronic low back pain (Bilateral) w/o sciatica 05/01/2018  . GERD (gastroesophageal reflux disease) 05/01/2018  . 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 Therapeutic opioid-induced constipation (OIC) 05/01/2018  . Centrilobular emphysema ([place]) 05/01/2018    Past Surgical History:  Procedure Laterality Date  . CHOLECYSTECTOMY    . TRANSURETHRAL RESECTION OF PROSTATE N/A 2008    Prior to Admission medications   Medication Sig Start Date End Date Taking? Authorizing Provider  amLODipine (NORVASC) 10 MG tablet Take 10 mg by mouth daily.    [provider]  aspirin EC 81 MG tablet Take 81 mg by mouth daily.    [provider]  diclofenac sodium (VOLTAREN) 1 % GEL Apply 2 g topically 4 (four) times daily. 12/17/18 03/17/19  Milinda Pointer, MD  DULoxetine (CYMBALTA) 30 MG capsule Take 1 capsule (30 mg total) by mouth daily. Patient not taking: Reported on 12/17/2018 07/12/18   Olin Hauser, DO  DULoxetine (CYMBALTA) 60 MG capsule  11/26/18   [provider]  fenofibrate (TRICOR) 145 MG tablet Take 145 mg by mouth daily.    [provider]  ferrous sulfate 325 (65 FE) MG tablet Take 325 mg by mouth daily with breakfast.    [provider]  hydrochlorothiazide (HYDRODIURIL) 25 MG tablet  11/25/18  [provider]  levofloxacin (LEVAQUIN) 500 MG tablet Take 1 tablet (500 mg total) by mouth daily. For 7 days Patient not taking: Reported on 12/17/2018 12/06/18   Olin Hauser, DO  meloxicam (MOBIC) 15 MG tablet Take 1 tablet (15  mg total) by mouth daily. 12/17/18 03/17/19  Milinda Pointer, MD  Multiple Vitamin (MULTIVITAMIN WITH MINERALS) TABS tablet Take 1 tablet by mouth daily.    [provider]  Oxycodone HCl 10 MG TABS Take 1 tablet (10 mg total) by mouth 3 (three) times daily as needed. Must last 30 days. 01/25/19 02/24/19  Milinda Pointer, MD  pantoprazole (PROTONIX) 40 MG tablet Take 40 mg by mouth 2 (two) times daily.    [provider]  polyethylene glycol (MIRALAX / GLYCOLAX) packet Take 17 g by mouth daily as needed for mild constipation.    [provider]  prednisoLONE acetate (PRED FORTE) 1 % ophthalmic suspension Place 1 drop into the left eye 2 (two) times daily.    [provider]  predniSONE (DELTASONE) 20 MG tablet Take daily with food. Start with 60mg  (3 pills) x 2 days, then reduce to 40mg  (2 pills) x 2 days, then 20mg  (1 pill) x 3 days 12/06/18   Olin Hauser, DO  pregabalin (LYRICA) 150 MG capsule Take 1 capsule (150 mg total) by mouth 3 (three) times daily. Must last 30 days 12/17/18 03/17/19  Milinda Pointer, MD  QUEtiapine (SEROQUEL) 25 MG tablet TAKE 1 TABLET BY MOUTH AT BEDTIME Patient taking differently: Take 25 mg by mouth at bedtime.  07/30/18   Parks Ranger, Devonne Doughty, DO  QUEtiapine (SEROQUEL) 50 MG tablet  11/22/18   [provider]  timolol (BETIMOL) 0.25 % ophthalmic solution 1-2 drops 2 (two) times daily.    [provider]  umeclidinium-vilanterol (ANORO ELLIPTA) 62.5-25 MCG/INH AEPB Inhale 1 puff into the lungs daily.    [provider]  venlafaxine XR (EFFEXOR-XR) 75 MG 24 hr capsule  11/25/18   [provider]    Allergies Azithromycin  Family History  Problem Relation Age of Onset  . Depression Mother   . Heart disease Father     Social History Social History   Tobacco Use  . Smoking status: Former Smoker    Years: 15.00  . Smokeless tobacco: Current User    Types: Chew  . Tobacco  comment: stopped 15 years ago  Substance Use Topics  . Alcohol use: Yes    Comment: past  . Drug use: Never    Review of Systems Constitutional: No fever/chills Eyes: No visual changes. ENT: No sore throat. Cardiovascular: Positive for chest tightness.  Respiratory: Positive for shortness of breath. Gastrointestinal: No abdominal pain.  No nausea, no vomiting.  No diarrhea.   Genitourinary: Negative for dysuria. Musculoskeletal: Negative for back pain. Skin: Negative for rash. Neurological: Negative for headaches, focal weakness or numbness.  ____________________________________________   PHYSICAL EXAM:  VITAL SIGNS: ED Triage Vitals  Enc Vitals Group     BP 01/02/19 1601 114/83     Pulse Rate 01/02/19 1601 (!) 105     Resp 01/02/19 1601 (!) 24     Temp 01/02/19 1610 97.9 F (36.6 C)     Temp Source 01/02/19 1601 Oral     SpO2 01/02/19 1601 93 %     Weight 01/02/19 1602 250 lb (113.4 kg)     Height 01/02/19 1602 5\' 11"  (1.803 m)     Head Circumference --  Peak Flow --      Pain Score 01/02/19 1601 7   Constitutional: Alert and oriented.  Eyes: Conjunctivae are normal.  ENT      Head: Normocephalic and atraumatic.      Nose: No congestion/rhinnorhea.      Mouth/Throat: Mucous membranes are moist.      Neck: No stridor. Hematological/Lymphatic/Immunilogical: No cervical lymphadenopathy. Cardiovascular: Normal rate, regular rhythm.  No murmurs, rubs, or gallops.  Respiratory: Normal respiratory effort without tachypnea nor retractions. Breath sounds are clear and equal bilaterally. No wheezes/rales/rhonchi. Gastrointestinal: Soft and non tender. No rebound. No guarding.  Genitourinary: Deferred Musculoskeletal: Normal range of motion in all extremities. No lower extremity edema. Neurologic:  Normal speech and language. No gross focal neurologic deficits are appreciated.  Skin:  Skin is warm, dry and intact. No rash noted. Psychiatric: Mood and affect are  normal. Speech and behavior are normal. Patient exhibits appropriate insight and judgment.  ____________________________________________    LABS (pertinent positives/negatives)  CBC wbc 6.0, hgb 13.6, plt 170 Covid neg Trop hs 25->24 BMP wnl except glu 139 ____________________________________________   EKG  I, Nance Pear, attending physician, personally viewed and interpreted this EKG  EKG Time: 1557 Rate: 107 Rhythm: sinus tachycardia with PVC Axis: normal Intervals: qtc 439 QRS: narrow, q waves v1 ST changes: no st elevation Impression: abnormal ekg   ____________________________________________    RADIOLOGY  CXR Left lower lung consolidation vs atelectasis  ____________________________________________   PROCEDURES  Procedures  ____________________________________________   INITIAL IMPRESSION / ASSESSMENT AND PLAN / ED COURSE  Pertinent labs & imaging results that were available during my care of the patient were reviewed by me and considered in my medical decision making (see chart for details).   Patient presented to the emergency department today because of concern for shortness of breath.  Patient does have a history of COPD.  Chest x-ray showed area of possible consolidation versus atelectasis.  Patient denied any fevers or leukocytosis.  This point I have lower suspicion for pneumonia.  Patient was covered negative.  Patient did feel better after treatment here for COPD.  This point I think likely patient symptoms coming from COPD exacerbation.  However will place patient on short course of antibiotics and on abundance of caution.  Will also give patient prescription for steroids. Discussed findings and plan with patient.   ____________________________________________   FINAL CLINICAL IMPRESSION(S) / ED DIAGNOSES  Final diagnoses:  SOB (shortness of breath)  COPD exacerbation ([place])     Note: This dictation was prepared with Dragon dictation.  Any transcriptional errors that result from this process are unintentional     Nance Pear, MD 01/02/19 2104

## 2019-01-02 NOTE — ED Notes (Signed)
Pt moved to bench per pt's request. Call bell within  arms reach. Pt has been advised to press the call bell when needed.

## 2019-01-02 NOTE — ED Notes (Signed)
ED Provider Goodman at bedside. 

## 2019-01-02 NOTE — ED Notes (Signed)
PT sitting on side of the bed. Pt refuses to lay in bed. This RN has advised pt to stay in bed to prevents a fall. Pt agitated and refuses to lay back in bed. Pt sitting at side of the bed at this time.

## 2019-01-02 NOTE — Discharge Instructions (Addendum)
Please seek medical attention for any high fevers, chest pain, shortness of breath, change in behavior, persistent vomiting, bloody stool or any other new or concerning symptoms.  

## 2019-01-21 ENCOUNTER — Other Ambulatory Visit: Payer: Self-pay

## 2019-01-21 ENCOUNTER — Encounter: Payer: Self-pay | Admitting: Pain Medicine

## 2019-01-21 ENCOUNTER — Ambulatory Visit: Payer: Medicare Other | Attending: Pain Medicine | Admitting: Pain Medicine

## 2019-01-21 VITALS — BP 136/77 | HR 80 | Temp 97.8°F | Resp 16 | Ht 71.0 in | Wt 250.0 lb

## 2019-01-21 DIAGNOSIS — M1711 Unilateral primary osteoarthritis, right knee: Secondary | ICD-10-CM

## 2019-01-21 DIAGNOSIS — M25561 Pain in right knee: Secondary | ICD-10-CM | POA: Diagnosis not present

## 2019-01-21 DIAGNOSIS — M25562 Pain in left knee: Secondary | ICD-10-CM

## 2019-01-21 DIAGNOSIS — G8929 Other chronic pain: Secondary | ICD-10-CM | POA: Diagnosis present

## 2019-01-21 DIAGNOSIS — M170 Bilateral primary osteoarthritis of knee: Secondary | ICD-10-CM | POA: Diagnosis not present

## 2019-01-21 DIAGNOSIS — M1712 Unilateral primary osteoarthritis, left knee: Secondary | ICD-10-CM

## 2019-01-21 MED ORDER — SODIUM HYALURONATE (VISCOSUP) 20 MG/2ML IX SOSY
2.0000 mL | PREFILLED_SYRINGE | Freq: Once | INTRA_ARTICULAR | Status: AC
  Administered 2019-01-21: 12:00:00 2 mL via INTRA_ARTICULAR

## 2019-01-21 MED ORDER — ROPIVACAINE HCL 2 MG/ML IJ SOLN
5.0000 mL | Freq: Once | INTRAMUSCULAR | Status: AC
  Administered 2019-01-21: 5 mL via INTRA_ARTICULAR
  Filled 2019-01-21: qty 10

## 2019-01-21 MED ORDER — LIDOCAINE HCL (PF) 1 % IJ SOLN
5.0000 mL | Freq: Once | INTRAMUSCULAR | Status: AC
  Administered 2019-01-21: 5 mL
  Filled 2019-01-21: qty 5

## 2019-01-21 NOTE — Progress Notes (Signed)
Patient's Name: Aaron Gibbs  MRN: [id number]  Referring Provider: [name] *  DOB: [birth year]  PCP: Olin Hauser, DO  DOS: 01/21/2019  Note by: Gaspar Cola, MD  Service setting: Ambulatory outpatient  Specialty: Interventional Pain Management  Patient type: Established  Location: [facility] (AMB) Pain Management Facility  Visit type: Interventional Procedure   Primary Reason for Visit: Interventional Pain Management Treatment. CC: Knee Pain (bilateral)  Procedure:          Anesthesia, Analgesia, Anxiolysis:  Type: Therapeutic Intra-Articular Hyalgan Knee Injection #4  Region: Lateral infrapatellar Knee Region Level: Knee Joint Laterality: Bilateral  Type: Local Anesthesia Indication(s): Analgesia         Local Anesthetic: Lidocaine 1-2% Route: Infiltration ([place]/IM) IV Access: Declined Sedation: Declined   Position: Sitting   Indications: 1. Chronic knee pain (Primary Area of Pain) (Bilateral) (L>R)   2. Osteoarthritis of knee (Bilateral)   3. Osteoarthritis of patellofemoral joint (Right)   4. Tricompartment osteoarthritis of knee (Left)    Pain Score: Pre-procedure: 7 /10 Post-procedure: 5 /10   Pre-op Assessment:  Aaron Gibbs is a 84 y.o. (year old), male patient, seen today for interventional treatment. He  has a past surgical history that includes Cholecystectomy and Transurethral resection of prostate (N/A, 2008). Aaron Gibbs has a current medication list which includes the following prescription(s): amlodipine, aspirin ec, diclofenac sodium, duloxetine, duloxetine, fenofibrate, ferrous sulfate, hydrochlorothiazide, levofloxacin, meloxicam, multivitamin with minerals, oxycodone hcl, pantoprazole, polyethylene glycol, prednisolone acetate, prednisone, prednisone, pregabalin, quetiapine, quetiapine, timolol, umeclidinium-vilanterol, and venlafaxine xr. His primarily concern today is the Knee Pain (bilateral)  Initial Vital Signs:  Pulse/HCG Rate:  84  Temp: 97.8 F (36.6 C) Resp: 18 BP: 129/78 SpO2: 94 %  BMI: Estimated body mass index is 34.87 kg/m as calculated from the following:   Height as of this encounter: 5\' 11"  (1.803 m).   Weight as of this encounter: 250 lb (113.4 kg).  Risk Assessment: Allergies: Reviewed. He is allergic to azithromycin.  Allergy Precautions: None required Coagulopathies: Reviewed. None identified.  Blood-thinner therapy: None at this time Active Infection(s): Reviewed. None identified. [name] is afebrile  Site Confirmation: [name] was asked to confirm the procedure and laterality before marking the site Procedure checklist: Completed Consent: Before the procedure and under the influence of no sedative(s), amnesic(s), or anxiolytics, the patient was informed of the treatment options, risks and possible complications. To fulfill our ethical and legal obligations, as recommended by the American Medical Association's Code of Ethics, I have informed the patient of my clinical impression; the nature and purpose of the treatment or procedure; the risks, benefits, and possible complications of the intervention; the alternatives, including doing nothing; the risk(s) and benefit(s) of the alternative treatment(s) or procedure(s); and the risk(s) and benefit(s) of doing nothing. The patient was provided information about the general risks and possible complications associated with the procedure. These may include, but are not limited to: failure to achieve desired goals, infection, bleeding, organ or nerve damage, allergic reactions, paralysis, and death. In addition, the patient was informed of those risks and complications associated to the procedure, such as failure to decrease pain; infection; bleeding; organ or nerve damage with subsequent damage to sensory, motor, and/or autonomic systems, resulting in permanent pain, numbness, and/or weakness of one or several areas of the body; allergic reactions;  (i.e.: anaphylactic reaction); and/or death. Furthermore, the patient was informed of those risks and complications associated with the medications. These include, but are not limited to: allergic  reactions (i.e.: anaphylactic or anaphylactoid reaction(s)); adrenal axis suppression; blood sugar elevation that in diabetics may result in ketoacidosis or comma; water retention that in patients with history of congestive heart failure may result in shortness of breath, pulmonary edema, and decompensation with resultant heart failure; weight gain; swelling or edema; medication-induced neural toxicity; particulate matter embolism and blood vessel occlusion with resultant organ, and/or nervous system infarction; and/or aseptic necrosis of one or more joints. Finally, the patient was informed that Medicine is not an exact science; therefore, there is also the possibility of unforeseen or unpredictable risks and/or possible complications that may result in a catastrophic outcome. The patient indicated having understood very clearly. We have given the patient no guarantees and we have made no promises. Enough time was given to the patient to ask questions, all of which were answered to the patient's satisfaction. [name] has indicated that he wanted to continue with the procedure. Attestation: I, the ordering provider, attest that I have discussed with the patient the benefits, risks, side-effects, alternatives, likelihood of achieving goals, and potential problems during recovery for the procedure that I have provided informed consent. Date  Time: 01/21/2019 11:26 AM  Pre-Procedure Preparation:  Monitoring: As per clinic protocol. Respiration, ETCO2, SpO2, BP, heart rate and rhythm monitor placed and checked for adequate function Safety Precautions: Patient was assessed for positional comfort and pressure points before starting the procedure. Time-out: I initiated and conducted the "Time-out" before starting the  procedure, as per protocol. The patient was asked to participate by confirming the accuracy of the "Time Out" information. Verification of the correct person, site, and procedure were performed and confirmed by me, the nursing staff, and the patient. "Time-out" conducted as per Joint Commission's Universal Protocol (UP.01.01.01). Time: 1139  Description of Procedure:          Target Area: Knee Joint Approach: Just above the Lateral tibial plateau, lateral to the infrapatellar tendon. Area Prepped: Entire knee area, from the mid-thigh to the mid-shin. Prepping solution: DuraPrep (Iodine Povacrylex [0.7% available iodine] and Isopropyl Alcohol, 74% w/w) Safety Precautions: Aspiration looking for blood return was conducted prior to all injections. At no point did we inject any substances, as a needle was being advanced. No attempts were made at seeking any paresthesias. Safe injection practices and needle disposal techniques used. Medications properly checked for expiration dates. SDV (single dose vial) medications used. Description of the Procedure: Protocol guidelines were followed. The patient was placed in position over the fluoroscopy table. The target area was identified and the area prepped in the usual manner. Skin & deeper tissues infiltrated with local anesthetic. Appropriate amount of time allowed to pass for local anesthetics to take effect. The procedure needles were then advanced to the target area. Proper needle placement secured. Negative aspiration confirmed. Solution injected in intermittent fashion, asking for systemic symptoms every 0.5cc of injectate. The needles were then removed and the area cleansed, making sure to leave some of the prepping solution back to take advantage of its long term bactericidal properties. Vitals:   01/21/19 1125 01/21/19 1144  BP: 129/78 136/77  Pulse: 84 80  Resp: 18 16  Temp: 97.8 F (36.6 C)   TempSrc: Oral   SpO2: 94% 95%  Weight: 250 lb (113.4  kg)   Height: 5\' 11"  (1.803 m)     Start Time: 1139 hrs. End Time: 1141 hrs. Materials:  Needle(s) Type: Regular needle Gauge: 25G Length: 1.5-in Medication(s): Please see orders for medications and dosing details.  Imaging Guidance:          Type of Imaging Technique: None used Indication(s): N/A Exposure Time: No patient exposure Contrast: None used. Fluoroscopic Guidance: N/A Ultrasound Guidance: N/A Interpretation: N/A  Antibiotic Prophylaxis:   Anti-infectives (From admission, onward)   None     Indication(s): None identified  Post-operative Assessment:  Post-procedure Vital Signs:  Pulse/HCG Rate: 80  Temp: 97.8 F (36.6 C) Resp: 16 BP: 136/77 SpO2: 95 %  EBL: None  Complications: No immediate post-treatment complications observed by team, or reported by patient.  Note: The patient tolerated the entire procedure well. A repeat set of vitals were taken after the procedure and the patient was kept under observation following institutional policy, for this type of procedure. Post-procedural neurological assessment was performed, showing return to baseline, prior to discharge. The patient was provided with post-procedure discharge instructions, including a section on how to identify potential problems. Should any problems arise concerning this procedure, the patient was given instructions to immediately contact us, at any time, without hesitation. In any case, we plan to contact the patient by telephone for a follow-up status report regarding this interventional procedure.  Comments:  No additional relevant information.  Plan of Care  Orders:  Orders Placed This Encounter  Procedures  . KNEE INJECTION    Hyalgan knee injection to be done by MD.    Scheduling Instructions:     Procedure: Intra-articular Hyalgan Knee injection #4     Side(s): Bilateral Knee     Sedation: None     Timeframe: Today    Order Specific Question:   Where will this procedure be  performed?    Answer:   [facility]  . KNEE INJECTION    Hyalgan knee injection. Please order Hyalgan.    Standing Status:   Future    Standing Expiration Date:   02/21/2019    Scheduling Instructions:     Procedure: Intra-articular Hyalgan Knee injection #5     Side: Bilateral     Sedation: None     Timeframe: in two (2) weeks    Order Specific Question:   Where will this procedure be performed?    Answer:   [facility]  . Provider attestation of informed consent for procedure/surgical case    I, the ordering provider, attest that I have discussed with the patient the benefits, risks, side effects, alternatives, likelihood of achieving goals and potential problems during recovery for the procedure that I have provided informed consent.    Standing Status:   Standing    Number of Occurrences:   1  . Informed Consent Details: Transcribe to consent form and obtain patient signature    Standing Status:   Standing    Number of Occurrences:   1    Order Specific Question:   Procedure    Answer:   Bilateral intra-articular viscosupplementation knee injection with Hyalgan    Order Specific Question:   Surgeon    Answer:   Joshlyn Beadle A. Dossie Arbour, MD    Order Specific Question:   Indication/Reason    Answer:   Bilateral knee osteoarthritis and arthralgia   Chronic Opioid Analgesic:  Oxycodone IR 10 mg, 1 tab PO q 8 hrs (30 mg/day of oxycodone) (has enough to last until 02/24/2019) MME/day: 45 mg/day.   Medications ordered for procedure: Meds ordered this encounter  Medications  . lidocaine (PF) (XYLOCAINE) 1 % injection 5 mL  . ropivacaine (PF) 2 mg/mL (0.2%) (NAROPIN) injection 5 mL  .  Sodium Hyaluronate SOSY 2 mL  . Sodium Hyaluronate SOSY 2 mL   Medications administered: We administered lidocaine (PF), ropivacaine (PF) 2 mg/mL (0.2%), Sodium Hyaluronate, and Sodium Hyaluronate.  See the medical record for exact dosing, route, and time of administration.  Follow-up  plan:   Return in about 2 weeks (around 02/04/2019) for Procedure (no sedation): (B) Hyalgan #5.       Interventional management options: Considering: Diagnostic bilateral genicular nerve block #1 Possible bilateral genicular nerve RFA   PRN Procedures: Palliative bilateral IA Hyalgan knee injections S2/N5  Palliative bilateral IA knee injections (w/ steroids) #1      Recent Visits Date Type Provider Dept  12/31/18 Procedure visit Milinda Pointer, MD [facility]  12/17/18 Procedure visit Milinda Pointer, MD [facility]  11/21/18 Procedure visit Milinda Pointer, MD [facility]  11/18/18 Office Visit Milinda Pointer, MD [facility]  10/23/18 Office Visit Milinda Pointer, MD [facility]  Showing recent visits within past 90 days and meeting all other requirements   Today's Visits Date Type Provider Dept  01/21/19 Procedure visit Milinda Pointer, MD [facility]  Showing today's visits and meeting all other requirements   Future Appointments Date Type Provider Dept  02/06/19 Appointment Milinda Pointer, MD [facility]  Showing future appointments within next 90 days and meeting all other requirements   Disposition: Discharge home  Discharge Date & Time: 01/21/2019; 1155 hrs.   Primary Care Physician: Olin Hauser, DO Location: [facility] Note by: Gaspar Cola, MD Date: 01/21/2019; Time: 12:41 PM  Disclaimer:  Medicine is not an Chief Strategy Officer. The only guarantee in medicine is that nothing is guaranteed. It is important to note that the decision to proceed with this intervention was based on the information collected from the patient. The Data and conclusions were drawn from the patient's questionnaire, the interview, and the physical examination. Because the information was provided in large part by the patient, it cannot be guaranteed that it  has not been purposely or unconsciously manipulated. Every effort has been made to obtain as much relevant data as possible for this evaluation. It is important to note that the conclusions that lead to this procedure are derived in large part from the available data. Always take into account that the treatment will also be dependent on availability of resources and existing treatment guidelines, considered by other Pain Management Practitioners as being common knowledge and practice, at the time of the intervention. For Medico-Legal purposes, it is also important to point out that variation in procedural techniques and pharmacological choices are the acceptable norm. The indications, contraindications, technique, and results of the above procedure should only be interpreted and judged by a Board-Certified Interventional Pain Specialist with extensive familiarity and expertise in the same exact procedure and technique.

## 2019-01-21 NOTE — Patient Instructions (Addendum)
____________________________________________________________________________________________  Post-Procedure Discharge Instructions  Instructions:  Apply ice:   Purpose: This will minimize any swelling and discomfort after procedure.   When: Day of procedure, as soon as you get home.  How: Fill a plastic sandwich bag with crushed ice. Cover it with a small towel and apply to injection site.  How long: (15 min on, 15 min off) Apply for 15 minutes then remove x 15 minutes.  Repeat sequence on day of procedure, until you go to bed.  Apply heat:   Purpose: To treat any soreness and discomfort from the procedure.  When: Starting the next day after the procedure.  How: Apply heat to procedure site starting the day following the procedure.  How long: May continue to repeat daily, until discomfort goes away.  Food intake: Start with clear liquids (like water) and advance to regular food, as tolerated.   Physical activities: Keep activities to a minimum for the first 8 hours after the procedure. After that, then as tolerated.  Driving: If you have received any sedation, be responsible and do not drive. You are not allowed to drive for 24 hours after having sedation.  Blood thinner: (Applies only to those taking blood thinners) You may restart your blood thinner 6 hours after your procedure.  Insulin: (Applies only to Diabetic patients taking insulin) As soon as you can eat, you may resume your normal dosing schedule.  Infection prevention: Keep procedure site clean and dry. Shower daily and clean area with soap and water.  Post-procedure Pain Diary: Extremely important that this be done correctly and accurately. Recorded information will be used to determine the next step in treatment. For the purpose of accuracy, follow these rules:  Evaluate only the area treated. Do not report or include pain from an untreated area. For the purpose of this evaluation, ignore all other areas of pain,  except for the treated area.  After your procedure, avoid taking a long nap and attempting to complete the pain diary after you wake up. Instead, set your alarm clock to go off every hour, on the hour, for the initial 8 hours after the procedure. Document the duration of the numbing medicine, and the relief you are getting from it.  Do not go to sleep and attempt to complete it later. It will not be accurate. If you received sedation, it is likely that you were given a medication that may cause amnesia. Because of this, completing the diary at a later time may cause the information to be inaccurate. This information is needed to plan your care.  Follow-up appointment: Keep your post-procedure follow-up evaluation appointment after the procedure (usually 2 weeks for most procedures, 6 weeks for radiofrequencies). DO NOT FORGET to bring you pain diary with you.   Expect: (What should I expect to see with my procedure?)  From numbing medicine (AKA: Local Anesthetics): Numbness or decrease in pain. You may also experience some weakness, which if present, could last for the duration of the local anesthetic.  Onset: Full effect within 15 minutes of injected.  Duration: It will depend on the type of local anesthetic used. On the average, 1 to 8 hours.   From steroids (Applies only if steroids were used): Decrease in swelling or inflammation. Once inflammation is improved, relief of the pain will follow.  Onset of benefits: Depends on the amount of swelling present. The more swelling, the longer it will take for the benefits to be seen. In some cases, up to 10 days.    Duration: Steroids will stay in the system x 2 weeks. Duration of benefits will depend on multiple posibilities including persistent irritating factors.  Side-effects: If present, they may typically last 2 weeks (the duration of the steroids).  Frequent: Cramps (if they occur, drink Gatorade and take over-the-counter Magnesium 450-500 mg  once to twice a day); water retention with temporary weight gain; increases in blood sugar; decreased immune system response; increased appetite.  Occasional: Facial flushing (red, warm cheeks); mood swings; menstrual changes.  Uncommon: Long-term decrease or suppression of natural hormones; bone thinning. (These are more common with higher doses or more frequent use. This is why we prefer that our patients avoid having any injection therapies in other practices.)   Very Rare: Severe mood changes; psychosis; aseptic necrosis.  From procedure: Some discomfort is to be expected once the numbing medicine wears off. This should be minimal if ice and heat are applied as instructed.  Call if: (When should I call?)  You experience numbness and weakness that gets worse with time, as opposed to wearing off.  New onset bowel or bladder incontinence. (Applies only to procedures done in the spine)  Emergency Numbers:  Durning business hours (Monday - Thursday, 8:00 AM - 4:00 PM) (Friday, 9:00 AM - 12:00 Noon): [phone]  After hours: [phone]  NOTE: If you are having a problem and are unable connect with, or to talk to a provider, then go to your nearest urgent care or emergency department. If the problem is serious and urgent, please call 911. ____________________________________________________________________________________________   ____________________________________________________________________________________________  Preparing for your procedure (without sedation)  Procedure appointments are limited to planned procedures: . No Prescription Refills. . No disability issues will be discussed. . No medication changes will be discussed.  Instructions: . Oral Intake: Do not eat or drink anything for at least 3 hours prior to your procedure. . Transportation: Unless otherwise stated by your physician, you may drive yourself after the procedure. . Blood Pressure Medicine:  Take your blood pressure medicine with a sip of water the morning of the procedure. . Blood thinners: Notify our staff if you are taking any blood thinners. Depending on which one you take, there will be specific instructions on how and when to stop it. . Diabetics on insulin: Notify the staff so that you can be scheduled 1st case in the morning. If your diabetes requires high dose insulin, take only  of your normal insulin dose the morning of the procedure and notify the staff that you have done so. . Preventing infections: Shower with an antibacterial soap the morning of your procedure.  . Build-up your immune system: Take 1000 mg of Vitamin C with every meal (3 times a day) the day prior to your procedure. . Antibiotics: Inform the staff if you have a condition or reason that requires you to take antibiotics before dental procedures. . Pregnancy: If you are pregnant, call and cancel the procedure. . Sickness: If you have a cold, fever, or any active infections, call and cancel the procedure. . Arrival: You must be in the facility at least 30 minutes prior to your scheduled procedure. . Children: Do not bring any children with you. . Dress appropriately: Bring dark clothing that you would not mind if they get stained. . Valuables: Do not bring any jewelry or valuables.  Reasons to call and reschedule or cancel your procedure: (Following these recommendations will minimize the risk of a serious complication.) . Surgeries: Avoid having procedures within 2 weeks   of any surgery. (Avoid for 2 weeks before or after any surgery). . Flu Shots: Avoid having procedures within 2 weeks of a flu shots or . (Avoid for 2 weeks before or after immunizations). . Barium: Avoid having a procedure within 7-10 days after having had a radiological study involving the use of radiological contrast. (Myelograms, Barium swallow or enema study). . Heart attacks: Avoid any elective procedures or surgeries for the initial 6  months after a "Myocardial Infarction" (Heart Attack). . Blood thinners: It is imperative that you stop these medications before procedures. Let us know if you if you take any blood thinner.  . Infection: Avoid procedures during or within two weeks of an infection (including chest colds or gastrointestinal problems). Symptoms associated with infections include: Localized redness, fever, chills, night sweats or profuse sweating, burning sensation when voiding, cough, congestion, stuffiness, runny nose, sore throat, diarrhea, nausea, vomiting, cold or Flu symptoms, recent or current infections. It is specially important if the infection is over the area that we intend to treat. Marland Kitchen Heart and lung problems: Symptoms that may suggest an active cardiopulmonary problem include: cough, chest pain, breathing difficulties or shortness of breath, dizziness, ankle swelling, uncontrolled high or unusually low blood pressure, and/or palpitations. If you are experiencing any of these symptoms, cancel your procedure and contact your primary care physician for an evaluation.  Remember:  Regular Business hours are:  Monday to Thursday 8:00 AM to 4:00 PM  Provider's Schedule: Milinda Pointer, MD:  Procedure days: Tuesday and Thursday 7:30 AM to 4:00 PM  Gillis Santa, MD:  Procedure days: Monday and Wednesday 7:30 AM to 4:00 PM ____________________________________________________________________________________________   Pain Management Discharge Instructions  General Discharge Instructions :  If you need to reach your doctor call: Monday-Friday 8:00 am - 4:00 pm at [phone] or toll free [phone].  After clinic hours [phone] to have operator reach doctor.  Bring all of your medication bottles to all your appointments in the pain clinic.  To cancel or reschedule your appointment with Pain Management please remember to call 24 hours in advance to avoid a fee.  Refer to the educational materials  which you have been given on: General Risks, I had my Procedure. Discharge Instructions, Post Sedation.  Post Procedure Instructions:  The drugs you were given will stay in your system until tomorrow, so for the next 24 hours you should not drive, make any legal decisions or drink any alcoholic beverages.  You may eat anything you prefer, but it is better to start with liquids then soups and crackers, and gradually work up to solid foods.  Please notify your doctor immediately if you have any unusual bleeding, trouble breathing or pain that is not related to your normal pain.  Depending on the type of procedure that was done, some parts of your body may feel week and/or numb.  This usually clears up by tonight or the next day.  Walk with the use of an assistive device or accompanied by an adult for the 24 hours.  You may use ice on the affected area for the first 24 hours.  Put ice in a Ziploc bag and cover with a towel and place against area 15 minutes on 15 minutes off.  You may switch to heat after 24 hours.

## 2019-01-22 ENCOUNTER — Telehealth: Payer: Self-pay | Admitting: *Deleted

## 2019-01-22 NOTE — Telephone Encounter (Signed)
Called to check on him post procedure. No answer. LVM.

## 2019-01-27 ENCOUNTER — Other Ambulatory Visit: Payer: Self-pay | Admitting: Pain Medicine

## 2019-01-27 DIAGNOSIS — G894 Chronic pain syndrome: Secondary | ICD-10-CM

## 2019-01-31 ENCOUNTER — Telehealth: Payer: Self-pay | Admitting: Family Medicine

## 2019-01-31 NOTE — Telephone Encounter (Signed)
Patient's daughter was concerned about his SOB he was in ED multiple time, wanted to avoid many hospital visit she doesn't know about his pulse ox, but wanted to find out if Rx oxygen is good idea. Spoke to the patient as well, he doesn't check his oxygen. Advised her that it might need face to face appt and if he gets worst with SOB he will need to go to the hospital.

## 2019-01-31 NOTE — Telephone Encounter (Signed)
Patient has COPD. I have recommended that he establish with a Pulmonologist previously and he has declined the referral.  He would need to be evaluated at hospital with shortness of breath and concerns of low oxygen.  We do not have any appointments now at time he called late this afternoon. Based on severity of symptoms he was advised to go to hospital if not improving.  He will need referral to Pulmonology to get tested to determine if he needs oxygen based on his COPD.  [name], [facility] 01/31/2019, 5:20 PM

## 2019-02-01 ENCOUNTER — Ambulatory Visit (INDEPENDENT_AMBULATORY_CARE_PROVIDER_SITE_OTHER): Payer: Medicare Other

## 2019-02-01 ENCOUNTER — Ambulatory Visit
Admission: EM | Admit: 2019-02-01 | Discharge: 2019-02-01 | Disposition: A | Discharge status: Home / Self Care | Payer: Medicare Other | Attending: Family Medicine | Admitting: Family Medicine

## 2019-02-01 DIAGNOSIS — J441 Chronic obstructive pulmonary disease with (acute) exacerbation: Secondary | ICD-10-CM | POA: Insufficient documentation

## 2019-02-01 DIAGNOSIS — R609 Edema, unspecified: Secondary | ICD-10-CM | POA: Diagnosis not present

## 2019-02-01 DIAGNOSIS — R0602 Shortness of breath: Secondary | ICD-10-CM

## 2019-02-01 DIAGNOSIS — L03119 Cellulitis of unspecified part of limb: Secondary | ICD-10-CM | POA: Insufficient documentation

## 2019-02-01 LAB — CBC WITH DIFFERENTIAL/PLATELET
Abs Immature Granulocytes: 0.01 10*3/uL (ref 0.00–0.07)
Basophils Absolute: 0 10*3/uL (ref 0.0–0.1)
Basophils Relative: 1 %
Eosinophils Absolute: 0.5 10*3/uL (ref 0.0–0.5)
Eosinophils Relative: 14 %
HCT: 41 % (ref 39.0–52.0)
Hemoglobin: 13.2 g/dL (ref 13.0–17.0)
Immature Granulocytes: 0 %
Lymphocytes Relative: 20 %
Lymphs Abs: 0.7 10*3/uL (ref 0.7–4.0)
MCH: 30 pg (ref 26.0–34.0)
MCHC: 32.2 g/dL (ref 30.0–36.0)
MCV: 93.2 fL (ref 80.0–100.0)
Monocytes Absolute: 0.7 10*3/uL (ref 0.1–1.0)
Monocytes Relative: 19 %
Neutro Abs: 1.6 10*3/uL — ABNORMAL LOW (ref 1.7–7.7)
Neutrophils Relative %: 46 %
Platelets: 127 10*3/uL — ABNORMAL LOW (ref 150–400)
RBC: 4.4 MIL/uL (ref 4.22–5.81)
RDW: 16.7 % — ABNORMAL HIGH (ref 11.5–15.5)
WBC: 3.5 10*3/uL — ABNORMAL LOW (ref 4.0–10.5)
nRBC: 0 % (ref 0.0–0.2)

## 2019-02-01 LAB — BASIC METABOLIC PANEL
Anion gap: 10 (ref 5–15)
BUN: 13 mg/dL (ref 8–23)
CO2: 24 mmol/L (ref 22–32)
Calcium: 8.6 mg/dL — ABNORMAL LOW (ref 8.9–10.3)
Chloride: 105 mmol/L (ref 98–111)
Creatinine, Ser: 0.93 mg/dL (ref 0.61–1.24)
GFR calc Af Amer: >60 mL/min (ref >60)
GFR calc non Af Amer: >60 mL/min (ref >60)
Glucose, Bld: 151 mg/dL — ABNORMAL HIGH (ref 70–99)
Potassium: 3.8 mmol/L (ref 3.5–5.1)
Sodium: 139 mmol/L (ref 135–145)

## 2019-02-01 LAB — MAGNESIUM: Magnesium: 2 mg/dL (ref 1.7–2.4)

## 2019-02-01 MED ORDER — ALBUTEROL SULFATE HFA 108 (90 BASE) MCG/ACT IN AERS: 1.0000 | INHALATION_SPRAY | Freq: Four times a day (QID) | RESPIRATORY_TRACT | 0 refills | Status: DC | PRN

## 2019-02-01 MED ORDER — PREDNISONE 20 MG PO TABS: ORAL_TABLET | ORAL | 0 refills | Status: DC

## 2019-02-01 MED ORDER — DOXYCYCLINE HYCLATE 100 MG PO TABS: 100.0000 mg | ORAL_TABLET | Freq: Two times a day (BID) | ORAL | 0 refills | Status: DC

## 2019-02-01 NOTE — Discharge Instructions (Signed)
Elevate legs and warm compresses to area Follow up with Primary Care Provider on Monday

## 2019-02-01 NOTE — ED Triage Notes (Signed)
Pt here for SOB for the past 1-2 days and states he has a mild hx of COPD. No fever or other symptoms reported except tightness in his chest. Did take a old rx he found of Levaquin yesterday. While in the room I did notice redness and swelling in both his lower legs and he said it has been present for a few "weeks" relative was concerned about swelling under his right eye that she noticed last night as well.

## 2019-02-01 NOTE — ED Provider Notes (Signed)
[facility]    CSN: [id number] Arrival date & time: 02/01/19  3976     History   Chief Complaint Chief Complaint  Patient presents with  . Shortness of Breath  . Leg Swelling    HPI Aaron Gibbs is a 84 y.o. male.   84 yo male with a c/o shortness of breath and chest pains intermittently for the past several months but seemed worse the past 1-2 days. Patient has been seen for this several times in the Emergency Department over the past several months and most recently last month.  Patient denies any cough, fevers, chills, palpitations, syncope.   Also c/o legs swollen and red. States he's been scratching and has a few small skin ulcers.    Shortness of Breath   Past Medical History:  Diagnosis Date  . Anxiety   . Cancer ([place])    skin  . COPD (chronic obstructive pulmonary disease) ([place])   . Glaucoma   . Osteoporosis    osteopenia    Patient Active Problem List   Diagnosis Date Noted  . Preop testing 11/14/2018  . Noncompliance with medication treatment due to overuse of medication 10/23/2018  . Neurogenic pain 08/20/2018  . Atherosclerotic peripheral vascular disease ([place]) 07/24/2018  . Tricompartment osteoarthritis of knee (Left) 07/24/2018  . Osteoarthritis of knee (Bilateral) 07/24/2018  . Osteoarthritis of patellofemoral joint (Right) 07/24/2018  . History of suicide attempt (06/12/18) 07/24/2018  . Long term current use of opiate analgesic 07/10/2018  . Pharmacologic therapy 07/10/2018  . Disorder of skeletal system 07/10/2018  . Problems influencing health status 07/10/2018  . Suicide attempt ([place]) (05/13/18) 06/26/2018  . Syncope and collapse 06/04/2018  . 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 Recurrent major depression-severe ([place]) 06/04/2018  . Blindness 05/10/2018  . Suicidal ideation 05/10/2018  . Chronic pain syndrome 05/01/2018  . DISH (diffuse idiopathic skeletal hyperostosis) 05/01/2018  . Osteoarthritis of multiple joints  05/01/2018  . Chronic knee pain (Primary Area of Pain) (Bilateral) (L>R) 05/01/2018  . Retinitis pigmentosa of both eyes 05/01/2018  . Glaucoma of both eyes 05/01/2018  . Essential hypertension 05/01/2018  . Chronic low back pain (Bilateral) w/o sciatica 05/01/2018  . GERD (gastroesophageal reflux disease) 05/01/2018  . 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 Therapeutic opioid-induced constipation (OIC) 05/01/2018  . Centrilobular emphysema ([place]) 05/01/2018    Past Surgical History:  Procedure Laterality Date  . CHOLECYSTECTOMY    . TRANSURETHRAL RESECTION OF PROSTATE N/A 2008       Home Medications    Prior to Admission medications   Medication Sig Start Date End Date Taking? Authorizing Provider  albuterol (VENTOLIN HFA) 108 (90 Base) MCG/ACT inhaler Inhale 1-2 puffs into the lungs every 6 (six) hours as needed for wheezing or shortness of breath. 02/01/19   Norval Gable, MD  amLODipine (NORVASC) 10 MG tablet Take 10 mg by mouth daily.    [provider]  aspirin EC 81 MG tablet Take 81 mg by mouth daily.    [provider]  diclofenac sodium (VOLTAREN) 1 % GEL Apply 2 g topically 4 (four) times daily. 12/17/18 03/17/19  Milinda Pointer, MD  doxycycline (VIBRA-TABS) 100 MG tablet Take 1 tablet (100 mg total) by mouth 2 (two) times daily. 02/01/19   Norval Gable, MD  DULoxetine (CYMBALTA) 30 MG capsule Take 1 capsule (30 mg total) by mouth daily. 07/12/18   Olin Hauser, DO  DULoxetine (CYMBALTA) 60 MG capsule  11/26/18   [provider]  fenofibrate (TRICOR) 145 MG tablet Take 145 mg by mouth daily.    [provider]  ferrous sulfate 325 (65 FE) MG tablet Take 325 mg by mouth daily with breakfast.    [provider]  hydrochlorothiazide (HYDRODIURIL) 25 MG tablet  11/25/18   [provider]  levofloxacin (LEVAQUIN) 500 MG tablet Take 1 tablet (500 mg total) by mouth daily. For 7 days 12/06/18    Olin Hauser, DO  meloxicam (MOBIC) 15 MG tablet Take 1 tablet (15 mg total) by mouth daily. 12/17/18 03/17/19  Milinda Pointer, MD  Multiple Vitamin (MULTIVITAMIN WITH MINERALS) TABS tablet Take 1 tablet by mouth daily.    [provider]  Oxycodone HCl 10 MG TABS Take 1 tablet (10 mg total) by mouth 3 (three) times daily as needed. Must last 30 days. 01/25/19 02/24/19  Milinda Pointer, MD  pantoprazole (PROTONIX) 40 MG tablet Take 40 mg by mouth 2 (two) times daily.    [provider]  polyethylene glycol (MIRALAX / GLYCOLAX) packet Take 17 g by mouth daily as needed for mild constipation.    [provider]  prednisoLONE acetate (PRED FORTE) 1 % ophthalmic suspension Place 1 drop into the left eye 2 (two) times daily.    [provider]  predniSONE (DELTASONE) 20 MG tablet Take daily with food. Start with 60mg  (3 pills) x 2 days, then reduce to 40mg  (2 pills) x 2 days, then 20mg  (1 pill) x 3 days 02/01/19   Norval Gable, MD  predniSONE (STERAPRED UNI-PAK 21 TAB) 10 MG (21) TBPK tablet Per packaging instruction 01/02/19   Nance Pear, MD  pregabalin (LYRICA) 150 MG capsule Take 1 capsule (150 mg total) by mouth 3 (three) times daily. Must last 30 days 12/17/18 03/17/19  Milinda Pointer, MD  QUEtiapine (SEROQUEL) 25 MG tablet TAKE 1 TABLET BY MOUTH AT BEDTIME Patient taking differently: Take 25 mg by mouth at bedtime.  07/30/18   Parks Ranger, Devonne Doughty, DO  QUEtiapine (SEROQUEL) 50 MG tablet  11/22/18   [provider]  timolol (BETIMOL) 0.25 % ophthalmic solution 1-2 drops 2 (two) times daily.    [provider]  umeclidinium-vilanterol (ANORO ELLIPTA) 62.5-25 MCG/INH AEPB Inhale 1 puff into the lungs daily.    [provider]  venlafaxine XR (EFFEXOR-XR) 75 MG 24 hr capsule  11/25/18   [provider]    Family History Family History  Problem Relation Age of Onset  . Depression Mother   . Heart disease  Father     Social History Social History   Tobacco Use  . Smoking status: Former Smoker    Years: 15.00  . Smokeless tobacco: Current User    Types: Chew  . Tobacco comment: stopped 15 years ago  Substance Use Topics  . Alcohol use: Yes    Comment: past  . Drug use: Never     Allergies   Azithromycin   Review of Systems Review of Systems  Respiratory: Positive for shortness of breath.      Physical Exam Triage Vital Signs ED Triage Vitals  Enc Vitals Group     BP 02/01/19 0904 139/73     Pulse Rate 02/01/19 0904 73     Resp 02/01/19 0904 18     Temp 02/01/19 0904 97.7 F (36.5 C)     Temp Source 02/01/19 0904 Oral     SpO2 02/01/19 0904 96 %     Weight 02/01/19 0903 250 lb (113.4 kg)  Height --      Head Circumference --      Peak Flow --      Pain Score 02/01/19 0902 8     Pain Loc --      Pain Edu? --      Excl. in [place]? --    No data found.  Updated Vital Signs BP 139/73 (BP Location: Right Arm)   Pulse 73   Temp 97.7 F (36.5 C) (Oral)   Resp 18   Wt 113.4 kg   SpO2 96%   BMI 34.87 kg/m   Visual Acuity Right Eye Distance:   Left Eye Distance:   Bilateral Distance:    Right Eye Near:   Left Eye Near:    Bilateral Near:     Physical Exam Vitals signs and nursing note reviewed.  Constitutional:      General: He is not in acute distress.    Appearance: He is not toxic-appearing or diaphoretic.  Cardiovascular:     Rate and Rhythm: Normal rate and regular rhythm.     Pulses: Normal pulses.     Heart sounds: Normal heart sounds.  Pulmonary:     Effort: Pulmonary effort is normal. No respiratory distress.     Breath sounds: Normal breath sounds. No stridor. No wheezing, rhonchi or rales.  Musculoskeletal:     Right lower leg: Edema (lower extremity skin with multiple excoriation, blanchable erythema, tendernessk) present.     Left lower leg: Edema (lower extremity skin with multiple excoriation, blanchable erythema, tenderness)  present.  Neurological:     Mental Status: He is alert.      UC Treatments / Results  Labs (all labs ordered are listed, but only abnormal results are displayed) Labs Reviewed  CBC WITH DIFFERENTIAL/PLATELET - Abnormal; Notable for the following components:      Result Value   WBC 3.5 (*)    RDW 16.7 (*)    Platelets 127 (*)    Neutro Abs 1.6 (*)    All other components within normal limits  BASIC METABOLIC PANEL - Abnormal; Notable for the following components:   Glucose, Bld 151 (*)    Calcium 8.6 (*)    All other components within normal limits  MAGNESIUM    EKG ED ECG REPORT   Date: 02/01/2019  EKG Time: 9:47  Rate:71  Rhythm: normal sinus rhythm,  normal EKG, normal sinus rhythm, unchanged from previous tracings  Axis:   Intervals:none  ST&T Change: none  Narrative Interpretation: no change from previous             Radiology Dg Chest 2 View  Result Date: 02/01/2019 CLINICAL DATA:  Shortness of breath and weakness. EXAM: CHEST - 2 VIEW COMPARISON:  01/02/2019 and 07/30/2018 and chest CT 07/02/2018 FINDINGS: Patient is kyphotic. There is new blunting at the right costophrenic angle but there is no significant pleural fluid on the lateral view. Otherwise, the lungs are clear. Heart and mediastinum are within normal limits. Bridging osteophytes in the thoracic spine. IMPRESSION: Blunting at the right costophrenic angle of uncertain etiology. Findings could be related to volume loss. No definite pleural fluid. Electronically Signed   By: Markus Daft M.D.   On: 02/01/2019 10:01    Procedures Procedures (including critical care time)  Medications Ordered in UC Medications - No data to display  Initial Impression / Assessment and Plan / UC Course  I have reviewed the triage vital signs and the nursing notes.  Pertinent labs & imaging  results that were available during my care of the patient were reviewed by me and considered in my medical decision making (see  chart for details).      Final Clinical Impressions(s) / UC Diagnoses   Final diagnoses:  COPD exacerbation ([place])  Cellulitis of lower leg  Peripheral edema     Discharge Instructions     Elevate legs and warm compresses to area Follow up with Primary Care Provider on Monday    ED Prescriptions    Medication Sig Dispense Auth. Provider   predniSONE (DELTASONE) 20 MG tablet Take daily with food. Start with 60mg  (3 pills) x 2 days, then reduce to 40mg  (2 pills) x 2 days, then 20mg  (1 pill) x 3 days 13 tablet Jarryn Altland, MD   doxycycline (VIBRA-TABS) 100 MG tablet Take 1 tablet (100 mg total) by mouth 2 (two) times daily. 20 tablet Norval Gable, MD   albuterol (VENTOLIN HFA) 108 (90 Base) MCG/ACT inhaler Inhale 1-2 puffs into the lungs every 6 (six) hours as needed for wheezing or shortness of breath. 8 g Norval Gable, MD      1. Labs/x-ray results and diagnosis reviewed with patient 2. rx as per orders above; reviewed possible side effects, interactions, risks and benefits  3. Recommend supportive treatment as above 4. Go to Emergency Department if symptoms worsen over the weekend 5. Follow-up prn    Controlled Substance Prescriptions [place] Controlled Substance Registry consulted? Not Applicable   Norval Gable, MD 02/01/19 [id number]

## 2019-02-06 ENCOUNTER — Other Ambulatory Visit: Payer: Self-pay

## 2019-02-06 ENCOUNTER — Ambulatory Visit: Payer: Medicare Other | Attending: Pain Medicine | Admitting: Pain Medicine

## 2019-02-06 VITALS — BP 154/88 | HR 84 | Temp 98.5°F | Resp 16 | Ht 69.0 in | Wt 255.0 lb

## 2019-02-06 DIAGNOSIS — M170 Bilateral primary osteoarthritis of knee: Secondary | ICD-10-CM | POA: Insufficient documentation

## 2019-02-06 DIAGNOSIS — G8929 Other chronic pain: Secondary | ICD-10-CM

## 2019-02-06 DIAGNOSIS — G894 Chronic pain syndrome: Secondary | ICD-10-CM | POA: Insufficient documentation

## 2019-02-06 DIAGNOSIS — M25562 Pain in left knee: Secondary | ICD-10-CM | POA: Diagnosis not present

## 2019-02-06 DIAGNOSIS — M1711 Unilateral primary osteoarthritis, right knee: Secondary | ICD-10-CM | POA: Insufficient documentation

## 2019-02-06 DIAGNOSIS — M25561 Pain in right knee: Secondary | ICD-10-CM | POA: Diagnosis not present

## 2019-02-06 DIAGNOSIS — M150 Primary generalized (osteo)arthritis: Secondary | ICD-10-CM | POA: Diagnosis present

## 2019-02-06 DIAGNOSIS — M792 Neuralgia and neuritis, unspecified: Secondary | ICD-10-CM | POA: Insufficient documentation

## 2019-02-06 DIAGNOSIS — M159 Polyosteoarthritis, unspecified: Secondary | ICD-10-CM

## 2019-02-06 MED ORDER — ROPIVACAINE HCL 2 MG/ML IJ SOLN
5.0000 mL | Freq: Once | INTRAMUSCULAR | Status: AC
  Administered 2019-02-06: 11:00:00 10 mL via INTRA_ARTICULAR
  Filled 2019-02-06: qty 10

## 2019-02-06 MED ORDER — SODIUM HYALURONATE (VISCOSUP) 20 MG/2ML IX SOSY
2.0000 mL | PREFILLED_SYRINGE | Freq: Once | INTRA_ARTICULAR | Status: AC
  Administered 2019-02-06: 11:00:00 via INTRA_ARTICULAR

## 2019-02-06 MED ORDER — MELOXICAM 15 MG PO TABS: 15.0000 mg | ORAL_TABLET | Freq: Every day | ORAL | 2 refills | Status: DC

## 2019-02-06 MED ORDER — LIDOCAINE HCL (PF) 1 % IJ SOLN
5.0000 mL | Freq: Once | INTRAMUSCULAR | Status: AC
  Administered 2019-02-06: 11:00:00 5 mL
  Filled 2019-02-06: qty 5

## 2019-02-06 MED ORDER — DICLOFENAC SODIUM 1 % TD GEL: 2.0000 g | Freq: Four times a day (QID) | TRANSDERMAL | 99 refills | Status: DC

## 2019-02-06 MED ORDER — PREGABALIN 150 MG PO CAPS: 150.0000 mg | ORAL_CAPSULE | Freq: Three times a day (TID) | ORAL | 2 refills | Status: DC

## 2019-02-06 MED ORDER — OXYCODONE HCL 10 MG PO TABS: 10.0000 mg | ORAL_TABLET | Freq: Three times a day (TID) | ORAL | 0 refills | Status: DC | PRN

## 2019-02-06 NOTE — Patient Instructions (Addendum)

## 2019-02-06 NOTE — Addendum Note (Signed)
Addended by: Milinda Pointer A on: 02/06/2019 11:38 AM   Modules accepted: Orders

## 2019-02-06 NOTE — Progress Notes (Addendum)
Patient's Name: Aaron Gibbs  MRN: [id number]  Referring Provider: [name] *  DOB: 10/20/1934  PCP: Olin Hauser, DO  DOS: 02/06/2019  Note by: Gaspar Cola, MD  Service setting: Ambulatory outpatient  Specialty: Interventional Pain Management  Patient type: Established  Location: [facility] (AMB) Pain Management Facility  Visit type: Interventional Procedure   Primary Reason for Visit: Interventional Pain Management Treatment. CC: Knee Pain (bilateral)  Procedure:          Anesthesia, Analgesia, Anxiolysis:  Type: Therapeutic Intra-Articular Hyalgan Knee Injection #5  Region: Lateral infrapatellar Knee Region Level: Knee Joint Laterality: Bilateral  Type: Local Anesthesia Indication(s): Analgesia         Local Anesthetic: Lidocaine 1-2% Route: Infiltration ([place]/IM) IV Access: Declined Sedation: Declined   Position: Sitting   Indications: 1. Chronic knee pain (Primary Area of Pain) (Bilateral) (L>R)   2. Osteoarthritis of knee (Bilateral)   3. Osteoarthritis of patellofemoral joint (Right)    Pain Score: Pre-procedure: 6 /10 Post-procedure: 6 /10   Pre-op Assessment:  Aaron Gibbs is a 84 y.o. (year old), male patient, seen today for interventional treatment. He  has a past surgical history that includes Cholecystectomy and Transurethral resection of prostate (N/A, 2008). Aaron Gibbs has a current medication list which includes the following prescription(s): albuterol, amlodipine, aspirin ec, diclofenac sodium, doxycycline, duloxetine, duloxetine, fenofibrate, ferrous sulfate, hydrochlorothiazide, levofloxacin, meloxicam, multivitamin with minerals, oxycodone hcl, pantoprazole, polyethylene glycol, prednisolone acetate, prednisone, prednisone, pregabalin, quetiapine, quetiapine, timolol, umeclidinium-vilanterol, and venlafaxine xr. His primarily concern today is the Knee Pain (bilateral)  Initial Vital Signs:  Pulse/HCG Rate: 84  Temp: 98.5 F (36.9  C) Resp: 16 BP: (!) 154/88 SpO2: 95 %  BMI: Estimated body mass index is 37.66 kg/m as calculated from the following:   Height as of this encounter: 5\' 9"  (1.753 m).   Weight as of this encounter: 255 lb (115.7 kg).  Risk Assessment: Allergies: Reviewed. He is allergic to azithromycin.  Allergy Precautions: None required Coagulopathies: Reviewed. None identified.  Blood-thinner therapy: None at this time Active Infection(s): Reviewed. None identified. Aaron Gibbs is afebrile  Site Confirmation: Aaron Gibbs was asked to confirm the procedure and laterality before marking the site Procedure checklist: Completed Consent: Before the procedure and under the influence of no sedative(s), amnesic(s), or anxiolytics, the patient was informed of the treatment options, risks and possible complications. To fulfill our ethical and legal obligations, as recommended by the American Medical Association's Code of Ethics, I have informed the patient of my clinical impression; the nature and purpose of the treatment or procedure; the risks, benefits, and possible complications of the intervention; the alternatives, including doing nothing; the risk(s) and benefit(s) of the alternative treatment(s) or procedure(s); and the risk(s) and benefit(s) of doing nothing. The patient was provided information about the general risks and possible complications associated with the procedure. These may include, but are not limited to: failure to achieve desired goals, infection, bleeding, organ or nerve damage, allergic reactions, paralysis, and death. In addition, the patient was informed of those risks and complications associated to the procedure, such as failure to decrease pain; infection; bleeding; organ or nerve damage with subsequent damage to sensory, motor, and/or autonomic systems, resulting in permanent pain, numbness, and/or weakness of one or several areas of the body; allergic reactions; (i.e.: anaphylactic  reaction); and/or death. Furthermore, the patient was informed of those risks and complications associated with the medications. These include, but are not limited to: allergic reactions (i.e.: anaphylactic or anaphylactoid  reaction(s)); adrenal axis suppression; blood sugar elevation that in diabetics may result in ketoacidosis or comma; water retention that in patients with history of congestive heart failure may result in shortness of breath, pulmonary edema, and decompensation with resultant heart failure; weight gain; swelling or edema; medication-induced neural toxicity; particulate matter embolism and blood vessel occlusion with resultant organ, and/or nervous system infarction; and/or aseptic necrosis of one or more joints. Finally, the patient was informed that Medicine is not an exact science; therefore, there is also the possibility of unforeseen or unpredictable risks and/or possible complications that may result in a catastrophic outcome. The patient indicated having understood very clearly. We have given the patient no guarantees and we have made no promises. Enough time was given to the patient to ask questions, all of which were answered to the patient's satisfaction. Aaron Gibbs has indicated that he wanted to continue with the procedure. Attestation: I, the ordering provider, attest that I have discussed with the patient the benefits, risks, side-effects, alternatives, likelihood of achieving goals, and potential problems during recovery for the procedure that I have provided informed consent. Date  Time: 02/06/2019 10:28 AM  Pre-Procedure Preparation:  Monitoring: As per clinic protocol. Respiration, ETCO2, SpO2, BP, heart rate and rhythm monitor placed and checked for adequate function Safety Precautions: Patient was assessed for positional comfort and pressure points before starting the procedure. Time-out: I initiated and conducted the "Time-out" before starting the procedure, as per  protocol. The patient was asked to participate by confirming the accuracy of the "Time Out" information. Verification of the correct person, site, and procedure were performed and confirmed by me, the nursing staff, and the patient. "Time-out" conducted as per Joint Commission's Universal Protocol (UP.01.01.01). Time: 1048  Description of Procedure:          Target Area: Knee Joint Approach: Just above the Lateral tibial plateau, lateral to the infrapatellar tendon. Area Prepped: Entire knee area, from the mid-thigh to the mid-shin. Prepping solution: DuraPrep (Iodine Povacrylex [0.7% available iodine] and Isopropyl Alcohol, 74% w/w) Safety Precautions: Aspiration looking for blood return was conducted prior to all injections. At no point did we inject any substances, as a needle was being advanced. No attempts were made at seeking any paresthesias. Safe injection practices and needle disposal techniques used. Medications properly checked for expiration dates. SDV (single dose vial) medications used. Description of the Procedure: Protocol guidelines were followed. The patient was placed in position over the fluoroscopy table. The target area was identified and the area prepped in the usual manner. Skin & deeper tissues infiltrated with local anesthetic. Appropriate amount of time allowed to pass for local anesthetics to take effect. The procedure needles were then advanced to the target area. Proper needle placement secured. Negative aspiration confirmed. Solution injected in intermittent fashion, asking for systemic symptoms every 0.5cc of injectate. The needles were then removed and the area cleansed, making sure to leave some of the prepping solution back to take advantage of its long term bactericidal properties. Vitals:   02/06/19 1027  BP: (!) 154/88  Pulse: 84  Resp: 16  Temp: 98.5 F (36.9 C)  TempSrc: Oral  SpO2: 95%  Weight: 255 lb (115.7 kg)  Height: 5\' 9"  (1.753 m)    Start Time:  1048 hrs. End Time: 1050 hrs. Materials:  Needle(s) Type: Regular needle Gauge: 25G Length: 1.5-in Medication(s): Please see orders for medications and dosing details.  Imaging Guidance:          Type of Imaging  Technique: None used Indication(s): N/A Exposure Time: No patient exposure Contrast: None used. Fluoroscopic Guidance: N/A Ultrasound Guidance: N/A Interpretation: N/A  Antibiotic Prophylaxis:   Anti-infectives (From admission, onward)   None     Indication(s): None identified  Post-operative Assessment:  Post-procedure Vital Signs:  Pulse/HCG Rate: 84  Temp: 98.5 F (36.9 C) Resp: 16 BP: (!) 154/88 SpO2: 95 %  EBL: None  Complications: No immediate post-treatment complications observed by team, or reported by patient.  Note: The patient tolerated the entire procedure well. A repeat set of vitals were taken after the procedure and the patient was kept under observation following institutional policy, for this type of procedure. Post-procedural neurological assessment was performed, showing return to baseline, prior to discharge. The patient was provided with post-procedure discharge instructions, including a section on how to identify potential problems. Should any problems arise concerning this procedure, the patient was given instructions to immediately contact us, at any time, without hesitation. In any case, we plan to contact the patient by telephone for a follow-up status report regarding this interventional procedure.  Comments:  No additional relevant information.  Plan of Care  Orders:  Orders Placed This Encounter  Procedures  . KNEE INJECTION    Hyalgan knee injection to be done by MD.    Scheduling Instructions:     Procedure: Intra-articular Hyalgan Knee injection #5     Side(s): Bilateral Knee     Sedation: None     Timeframe: Today    Order Specific Question:   Where will this procedure be performed?    Answer:   [facility]  .  Provider attestation of informed consent for procedure/surgical case    I, the ordering provider, attest that I have discussed with the patient the benefits, risks, side effects, alternatives, likelihood of achieving goals and potential problems during recovery for the procedure that I have provided informed consent.    Standing Status:   Standing    Number of Occurrences:   1  . Informed Consent Details: Transcribe to consent form and obtain patient signature    Standing Status:   Standing    Number of Occurrences:   1    Order Specific Question:   Procedure    Answer:   Bilateral intra-articular viscosupplementation knee injection with Hyalgan    Order Specific Question:   Surgeon    Answer:   Tannis Burstein A. Dossie Arbour, MD    Order Specific Question:   Indication/Reason    Answer:   Bilateral knee osteoarthritis and arthralgia   Chronic Opioid Analgesic:  Oxycodone IR 10 mg, 1 tab PO q 8 hrs (30 mg/day of oxycodone) (has enough to last until 02/24/2019) MME/day: 45 mg/day.   Medications ordered for procedure: Meds ordered this encounter  Medications  . lidocaine (PF) (XYLOCAINE) 1 % injection 5 mL  . ropivacaine (PF) 2 mg/mL (0.2%) (NAROPIN) injection 5 mL  . Sodium Hyaluronate SOSY 2 mL  . Sodium Hyaluronate SOSY 2 mL  . pregabalin (LYRICA) 150 MG capsule    Sig: Take 1 capsule (150 mg total) by mouth 3 (three) times daily. Must last 30 days    Dispense:  90 capsule    Refill:  2    Fill one day early if pharmacy is closed on scheduled refill date. May substitute for generic if available.  . meloxicam (MOBIC) 15 MG tablet    Sig: Take 1 tablet (15 mg total) by mouth daily.    Dispense:  30 tablet  Refill:  2    Fill one day early if pharmacy is closed on scheduled refill date. May substitute for generic if available.  . diclofenac sodium (VOLTAREN) 1 % GEL    Sig: Apply 2 g topically 4 (four) times daily.    Dispense:  350 g    Refill:  PRN    Fill one day early if pharmacy is  closed on scheduled refill date. May substitute for generic if available.  . Oxycodone HCl 10 MG TABS    Sig: Take 1 tablet (10 mg total) by mouth 3 (three) times daily as needed. Must last 30 days.    Dispense:  90 tablet    Refill:  0    Chronic Pain. (STOP Act - Not applicable). Fill one day early if closed on scheduled refill date. Do not fill until: 02/24/2019. To last until: 03/26/2019.   Medications administered: We administered lidocaine (PF), ropivacaine (PF) 2 mg/mL (0.2%), Sodium Hyaluronate, and Sodium Hyaluronate.  See the medical record for exact dosing, route, and time of administration.  Follow-up plan:   Return in about 2 weeks (around 02/20/2019) for (VV), E/M (PP).       Interventional management options: Considering: Diagnostic bilateral genicular nerve block #1 Possible bilateral genicular nerve RFA   PRN Procedures: Palliative bilateral IA Hyalgan knee injections S2/N5  Palliative bilateral IA knee injections (w/ steroids) #1       Recent Visits Date Type Provider Dept  01/21/19 Procedure visit Milinda Pointer, MD [facility]  12/31/18 Procedure visit Milinda Pointer, MD [facility]  12/17/18 Procedure visit Milinda Pointer, MD [facility]  11/21/18 Procedure visit Milinda Pointer, MD [facility]  11/18/18 Office Visit Milinda Pointer, MD [facility]  Showing recent visits within past 90 days and meeting all other requirements   Today's Visits Date Type Provider Dept  02/06/19 Procedure visit Milinda Pointer, MD [facility]  Showing today's visits and meeting all other requirements   Future Appointments Date Type Provider Dept  03/03/19 Appointment Milinda Pointer, MD [facility]  Showing future appointments within next 90 days and meeting all other requirements   Disposition: Discharge home  Discharge Date & Time: 02/06/2019; 1100 hrs.   Primary Care  Physician: Olin Hauser, DO Location: [facility] Note by: Gaspar Cola, MD Date: 02/06/2019; Time: 11:38 AM  Disclaimer:  Medicine is not an Chief Strategy Officer. The only guarantee in medicine is that nothing is guaranteed. It is important to note that the decision to proceed with this intervention was based on the information collected from the patient. The Data and conclusions were drawn from the patient's questionnaire, the interview, and the physical examination. Because the information was provided in large part by the patient, it cannot be guaranteed that it has not been purposely or unconsciously manipulated. Every effort has been made to obtain as much relevant data as possible for this evaluation. It is important to note that the conclusions that lead to this procedure are derived in large part from the available data. Always take into account that the treatment will also be dependent on availability of resources and existing treatment guidelines, considered by other Pain Management Practitioners as being common knowledge and practice, at the time of the intervention. For Medico-Legal purposes, it is also important to point out that variation in procedural techniques and pharmacological choices are the acceptable norm. The indications, contraindications, technique, and results of the above procedure should only  be interpreted and judged by a Board-Certified Interventional Pain Specialist with extensive familiarity and expertise in the same exact procedure and technique.

## 2019-02-07 ENCOUNTER — Telehealth: Payer: Self-pay

## 2019-02-07 NOTE — Telephone Encounter (Signed)
Post procedure phone call.  Patient is doing fine.

## 2019-02-11 ENCOUNTER — Ambulatory Visit (INDEPENDENT_AMBULATORY_CARE_PROVIDER_SITE_OTHER): Payer: Medicare Other | Admitting: Family Medicine

## 2019-02-11 ENCOUNTER — Other Ambulatory Visit: Payer: Self-pay

## 2019-02-11 ENCOUNTER — Encounter: Payer: Self-pay | Admitting: Family Medicine

## 2019-02-11 DIAGNOSIS — J441 Chronic obstructive pulmonary disease with (acute) exacerbation: Secondary | ICD-10-CM | POA: Diagnosis not present

## 2019-02-11 MED ORDER — PREDNISONE 20 MG PO TABS: ORAL_TABLET | ORAL | 0 refills | Status: DC

## 2019-02-11 MED ORDER — LEVOFLOXACIN 500 MG PO TABS: 500.0000 mg | ORAL_TABLET | Freq: Every day | ORAL | 0 refills | Status: DC

## 2019-02-11 NOTE — Progress Notes (Signed)
Virtual Visit via Telephone The purpose of this virtual visit is to provide medical care while limiting exposure to the novel coronavirus (COVID19) for both patient and office staff.  Consent was obtained for phone visit:  Yes.   Answered questions that patient had about telehealth interaction:  Yes.   I discussed the limitations, risks, security and privacy concerns of performing an evaluation and management service by telephone. I also discussed with the patient that there may be a patient responsible charge related to this service. The patient expressed understanding and agreed to proceed.  Patient Location: Home Provider Location: [facility])  ---------------------------------------------------------------------- Chief Complaint  Patient presents with  . Shortness of Breath  . Diarrhea    onset 2 days    S: Reviewed CMA documentation. I have called patient and gathered additional HPI as follows:   Urgent Care FOLLOW-UP VISIT  Hospital[facility]Location: [facility]   Date of ED Visit: 02/01/19  Reason for Presenting to ED: dyspnea, COPD Primary (+Secondary) Diagnosis: COPD exac  FOLLOW-UP - ED provider note and record have been reviewed - Patient presents today about 10 days after recent ED visit. Brief summary of recent course, patient had symptoms of dyspnea and shortness of breath for few days, presented to ED on 02/01/19, testing in ED with Chest X-ray see below, labs unremarkable, COVID19 negative, Troponin, , treated with duoneb, steroid, and discharged on doxycycline and prednisone. - Today reports overall has done well after discharge from Urgent Care. Symptoms of dyspnea had improved but seem worse again, he never fully resolved but still has some dyspnea. He has chronic recurrent flare COPD dating back to 09/2018 and 11/2018 and 12/2018, frequent flares usually respond to steroid and levaquin, he has previous order from pulmonologist in past to take doxycycline  1 week out of every month, he is not established with pulmonology locally  I have reviewed the discharge medication list, and have reconciled the current and discharge medications today.   Denies any high risk travel to areas of current concern for COVID19. Denies any known or suspected exposure to person with or possibly with COVID19.  Denies any fevers, chills, sweats, body ache, sinus pain or pressure, headache, abdominal pain, diarrhea  Past Medical History:  Diagnosis Date  . Anxiety   . Cancer ([place])    skin  . COPD (chronic obstructive pulmonary disease) ([place])   . Glaucoma   . Osteoporosis    osteopenia   Social History   Tobacco Use  . Smoking status: Former Smoker    Years: 15.00  . Smokeless tobacco: Current User    Types: Chew  . Tobacco comment: stopped 15 years ago  Substance Use Topics  . Alcohol use: Yes    Comment: past  . Drug use: Never    Current Outpatient Medications:  .  albuterol (VENTOLIN HFA) 108 (90 Base) MCG/ACT inhaler, Inhale 1-2 puffs into the lungs every 6 (six) hours as needed for wheezing or shortness of breath., Disp: 8 g, Rfl: 0 .  amLODipine (NORVASC) 10 MG tablet, Take 10 mg by mouth daily., Disp: , Rfl:  .  aspirin EC 81 MG tablet, Take 81 mg by mouth daily., Disp: , Rfl:  .  doxycycline (VIBRA-TABS) 100 MG tablet, Take 1 tablet (100 mg total) by mouth 2 (two) times daily., Disp: 20 tablet, Rfl: 0 .  DULoxetine (CYMBALTA) 60 MG capsule, , Disp: , Rfl:  .  fenofibrate (TRICOR) 145 MG tablet, Take 145 mg by mouth daily., Disp: ,  Rfl:  .  ferrous sulfate 325 (65 FE) MG tablet, Take 325 mg by mouth daily with breakfast., Disp: , Rfl:  .  hydrochlorothiazide (HYDRODIURIL) 25 MG tablet, , Disp: , Rfl:  .  [START ON 03/17/2019] meloxicam (MOBIC) 15 MG tablet, Take 1 tablet (15 mg total) by mouth daily., Disp: 30 tablet, Rfl: 2 .  Multiple Vitamin (MULTIVITAMIN WITH MINERALS) TABS tablet, Take 1 tablet by mouth daily., Disp: , Rfl:  .  [START  ON 02/24/2019] Oxycodone HCl 10 MG TABS, Take 1 tablet (10 mg total) by mouth 3 (three) times daily as needed. Must last 30 days., Disp: 90 tablet, Rfl: 0 .  pantoprazole (PROTONIX) 40 MG tablet, Take 40 mg by mouth 2 (two) times daily., Disp: , Rfl:  .  prednisoLONE acetate (PRED FORTE) 1 % ophthalmic suspension, Place 1 drop into the left eye 2 (two) times daily., Disp: , Rfl:  .  QUEtiapine (SEROQUEL) 50 MG tablet, , Disp: , Rfl:  .  timolol (BETIMOL) 0.25 % ophthalmic solution, 1-2 drops 2 (two) times daily., Disp: , Rfl:  .  umeclidinium-vilanterol (ANORO ELLIPTA) 62.5-25 MCG/INH AEPB, Inhale 1 puff into the lungs daily., Disp: , Rfl:  .  [START ON 03/17/2019] diclofenac sodium (VOLTAREN) 1 % GEL, Apply 2 g topically 4 (four) times daily. (Patient not taking: Reported on 02/11/2019), Disp: 350 g, Rfl: PRN .  levofloxacin (LEVAQUIN) 500 MG tablet, Take 1 tablet (500 mg total) by mouth daily. For 7 days, Disp: 7 tablet, Rfl: 0 .  polyethylene glycol (MIRALAX / GLYCOLAX) packet, Take 17 g by mouth daily as needed for mild constipation., Disp: , Rfl:  .  predniSONE (DELTASONE) 20 MG tablet, Take daily with food. Start with 60mg  (3 pills) x 2 days, then reduce to 40mg  (2 pills) x 2 days, then 20mg  (1 pill) x 3 days, Disp: 13 tablet, Rfl: 0 .  [START ON 03/17/2019] pregabalin (LYRICA) 150 MG capsule, Take 1 capsule (150 mg total) by mouth 3 (three) times daily. Must last 30 days (Patient not taking: Reported on 02/11/2019), Disp: 90 capsule, Rfl: 2  Depression screen [facility] 2/9 12/17/2018 09/20/2018 09/17/2018  Decreased Interest 0 0 0  Down, Depressed, Hopeless 0 0 0  PHQ - 2 Score 0 0 0  Altered sleeping - 0 0  Tired, decreased energy - 0 0  Change in appetite - 0 0  Feeling bad or failure about yourself  - 0 0  Trouble concentrating - 0 0  Moving slowly or fidgety/restless - 0 0  Suicidal thoughts - 0 0  PHQ-9 Score - 0 0  Difficult doing work/chores - Not difficult at all Not difficult at all    GAD  7 : Generalized Anxiety Score 07/12/2018 05/01/2018  Nervous, Anxious, on Edge 1 0  Control/stop worrying 1 0  Worry too much - different things 1 0  Trouble relaxing 1 0  Restless 0 0  Easily annoyed or irritable 0 0  Afraid - awful might happen 1 0  Total GAD 7 Score 5 0  Anxiety Difficulty Somewhat difficult Not difficult at all    -------------------------------------------------------------------------- O: No physical exam performed due to remote telephone encounter.  Lab results reviewed.  I have personally reviewed the radiology report from 02/01/19 Chest X-ray.  DG Chest 2 ViewPerformed 02/01/2019 Final result  Study Result CLINICAL DATA: Shortness of breath and weakness.  EXAM: CHEST - 2 VIEW  COMPARISON: 01/02/2019 and 07/30/2018 and chest CT 07/02/2018  FINDINGS: Patient is kyphotic.  There is new blunting at the right costophrenic angle but there is no significant pleural fluid on the lateral view. Otherwise, the lungs are clear. Heart and mediastinum are within normal limits. Bridging osteophytes in the thoracic spine.  IMPRESSION: Blunting at the right costophrenic angle of uncertain etiology. Findings could be related to volume loss. No definite pleural fluid.   Electronically Signed By: Markus Daft M.D. On: 02/01/2019 10:01   Recent Results (from the past 2160 hour(s))  Novel Coronavirus, NAA (hospital order; send-out to ref lab)     Status: None   Collection Time: 11/18/18  1:08 PM   Specimen: Nasopharyngeal Swab; Respiratory  Result Value Ref Range   SARS-CoV-2, NAA NOT DETECTED NOT DETECTED    Comment: (NOTE) This test was developed and its performance characteristics determined by [employer]. This test has not been FDA cleared or approved. This test has been authorized by FDA under an Emergency Use Authorization (EUA). This test is only authorized for the duration of time the declaration that circumstances exist justifying the  authorization of the emergency use of in vitro diagnostic tests for detection of SARS-CoV-2 virus and/or diagnosis of COVID-19 infection under section 564(b)(1) of the Act, 21 U.S.C. [id number]), unless the authorization is terminated or revoked sooner. When diagnostic testing is negative, the possibility of a false negative result should be considered in the context of a patient's recent exposures and the presence of clinical signs and symptoms consistent with COVID-19. An individual without symptoms of COVID-19 and who is not shedding SARS-CoV-2 virus would expect to have a negative (not detected) result in this assay. Performed  At: [facility] [address] [place], [place] [id number] Rush Farmer MD [id number]    Coronavirus Source NASOPHARYNGEAL     Comment: Performed at [facility], [address] [place], [place] [address]  Novel Coronavirus, NAA (hospital order; send-out to ref lab)     Status: None   Collection Time: 12/02/18  1:05 PM   Specimen: Nasopharyngeal Swab; Respiratory  Result Value Ref Range   SARS-CoV-2, NAA NOT DETECTED NOT DETECTED    Comment: (NOTE) This test was developed and its performance characteristics determined by [employer]. This test has not been FDA cleared or approved. This test has been authorized by FDA under an Emergency Use Authorization (EUA). This test is only authorized for the duration of time the declaration that circumstances exist justifying the authorization of the emergency use of in vitro diagnostic tests for detection of SARS-CoV-2 virus and/or diagnosis of COVID-19 infection under section 564(b)(1) of the Act, 21 U.S.C. [id number]), unless the authorization is terminated or revoked sooner. When diagnostic testing is negative, the possibility of a false negative result should be considered in the context of a patient's recent exposures and the presence of clinical signs and symptoms  consistent with COVID-19. An individual without symptoms of COVID-19 and who is not shedding SARS-CoV-2 virus would expect to have a negative (not detected) result in this assay. Performed  At: [facility] [address] [place], [place] [id number] Rush Farmer MD [id number]    Coronavirus Source NASOPHARYNGEAL     Comment: Performed at [facility], [address] [place], [place] [address]  Novel Coronavirus, NAA (hospital order; send-out to ref lab)     Status: None   Collection Time: 12/13/18  1:07 PM   Specimen: Nasopharyngeal Swab; Respiratory  Result Value Ref Range   SARS-CoV-2, NAA NOT DETECTED NOT DETECTED    Comment: (NOTE) This test  was developed and its performance characteristics determined by [employer]. This test has not been FDA cleared or approved. This test has been authorized by FDA under an Emergency Use Authorization (EUA). This test is only authorized for the duration of time the declaration that circumstances exist justifying the authorization of the emergency use of in vitro diagnostic tests for detection of SARS-CoV-2 virus and/or diagnosis of COVID-19 infection under section 564(b)(1) of the Act, 21 U.S.C. [id number]), unless the authorization is terminated or revoked sooner. When diagnostic testing is negative, the possibility of a false negative result should be considered in the context of a patient's recent exposures and the presence of clinical signs and symptoms consistent with COVID-19. An individual without symptoms of COVID-19 and who is not shedding SARS-CoV-2 virus would expect to have a negative (not detected) result in this assay. Performed  At: [facility] [address] [place], [place] [id number] Rush Farmer MD [id number]    Coronavirus Source NASOPHARYNGEAL     Comment: Performed at [facility], [address] [place],  [address]  SARS Coronavirus 2 (Performed in  [facility] hospital lab)     Status: None   Collection Time: 12/27/18  1:11 PM   Specimen: Nasal Swab  Result Value Ref Range   SARS Coronavirus 2 NEGATIVE NEGATIVE    Comment: (NOTE) SARS-CoV-2 target nucleic acids are NOT DETECTED. The SARS-CoV-2 RNA is generally detectable in upper and lower respiratory specimens during the acute phase of infection. Negative results do not preclude SARS-CoV-2 infection, do not rule out co-infections with other pathogens, and should not be used as the sole basis for treatment or other patient management decisions. Negative results must be combined with clinical observations, patient history, and epidemiological information. The expected result is Negative. Fact Sheet for Patients: SugarRoll.be Fact Sheet for Healthcare Providers: https://www.woods-mathews.com/ This test is not yet approved or cleared by the [place] FDA and  has been authorized for detection and/or diagnosis of SARS-CoV-2 by FDA under an Emergency Use Authorization (EUA). This EUA will remain  in effect (meaning this test can be used) for the duration of the COVID-19 declaration under Section 56 4(b)(1) of the Act, 21 U.S.C. section 360bbb-3(b)(1), unless the authorization is terminated or revoked sooner. Performed at [facility], [place] [address], [place], [place] [address]   CBC with Differential     Status: Abnormal   Collection Time: 01/02/19  4:12 PM  Result Value Ref Range   WBC 6.0 4.0 - 10.5 K/uL   RBC 4.60 4.22 - 5.81 MIL/uL   Hemoglobin 13.6 13.0 - 17.0 g/dL   HCT 42.2 39.0 - 52.0 %   MCV 91.7 80.0 - 100.0 fL   MCH 29.6 26.0 - 34.0 pg   MCHC 32.2 30.0 - 36.0 g/dL   RDW 16.6 (H) 11.5 - 15.5 %   Platelets 170 150 - 400 K/uL   nRBC 0.0 0.0 - 0.2 %   Neutrophils Relative % 58 %   Neutro Abs 3.5 1.7 - 7.7 K/uL   Lymphocytes Relative 18 %   Lymphs Abs 1.1 0.7 - 4.0 K/uL   Monocytes Relative 16 %   Monocytes  Absolute 0.9 0.1 - 1.0 K/uL   Eosinophils Relative 6 %   Eosinophils Absolute 0.4 0.0 - 0.5 K/uL   Basophils Relative 1 %   Basophils Absolute 0.1 0.0 - 0.1 K/uL   Immature Granulocytes 1 %   Abs Immature Granulocytes 0.03 0.00 - 0.07 K/uL  Comment: Performed at [facility], [address] [place], [place] [id number]  Basic metabolic panel     Status: Abnormal   Collection Time: 01/02/19  4:12 PM  Result Value Ref Range   Sodium 142 135 - 145 mmol/L   Potassium 4.2 3.5 - 5.1 mmol/L   Chloride 107 98 - 111 mmol/L   CO2 23 22 - 32 mmol/L   Glucose, Bld 139 (H) 70 - 99 mg/dL   BUN 19 8 - 23 mg/dL   Creatinine, Ser 0.97 0.61 - 1.24 mg/dL   Calcium 8.9 8.9 - 10.3 mg/dL   GFR calc non Af Amer >60 >60 mL/min   GFR calc Af Amer >60 >60 mL/min   Anion gap 12 5 - 15    Comment: Performed at [facility], [place], [place] [address]  Troponin I (High Sensitivity)     Status: Abnormal   Collection Time: 01/02/19  4:12 PM  Result Value Ref Range   Troponin I (High Sensitivity) 25 (H) <18 ng/L    Comment: (NOTE) Elevated high sensitivity troponin I (hsTnI) values and significant  changes across serial measurements may suggest ACS but many other  chronic and acute conditions are known to elevate hsTnI results.  Refer to the "Links" section for chest pain algorithms and additional  guidance. Performed at [facility], [address] [place], [place] [address]   SARS Coronavirus 2 (CEPHEID- Performed in [facility] hospital lab), Hosp Order     Status: None   Collection Time: 01/02/19  5:58 PM   Specimen: Nasopharyngeal Swab  Result Value Ref Range   SARS Coronavirus 2 NEGATIVE NEGATIVE    Comment: (NOTE) If result is NEGATIVE SARS-CoV-2 target nucleic acids are NOT DETECTED. The SARS-CoV-2 RNA is generally detectable in upper and lower  respiratory specimens during the acute phase of infection. The lowest  concentration of SARS-CoV-2  viral copies this assay can detect is 250  copies / mL. A negative result does not preclude SARS-CoV-2 infection  and should not be used as the sole basis for treatment or other  patient management decisions.  A negative result may occur with  improper specimen collection / handling, submission of specimen other  than nasopharyngeal swab, presence of viral mutation(s) within the  areas targeted by this assay, and inadequate number of viral copies  (<250 copies / mL). A negative result must be combined with clinical  observations, patient history, and epidemiological information. If result is POSITIVE SARS-CoV-2 target nucleic acids are DETECTED. The SARS-CoV-2 RNA is generally detectable in upper and lower  respiratory specimens dur ing the acute phase of infection.  Positive  results are indicative of active infection with SARS-CoV-2.  Clinical  correlation with patient history and other diagnostic information is  necessary to determine patient infection status.  Positive results do  not rule out bacterial infection or co-infection with other viruses. If result is PRESUMPTIVE POSTIVE SARS-CoV-2 nucleic acids MAY BE PRESENT.   A presumptive positive result was obtained on the submitted specimen  and confirmed on repeat testing.  While 2019 novel coronavirus  (SARS-CoV-2) nucleic acids may be present in the submitted sample  additional confirmatory testing may be necessary for epidemiological  and / or clinical management purposes  to differentiate between  SARS-CoV-2 and other Sarbecovirus currently known to infect humans.  If clinically indicated additional testing with an alternate test  methodology [phone]) is advised. The SARS-CoV-2 RNA is generally  detectable in upper  and lower respiratory sp ecimens during the acute  phase of infection. The expected result is Negative. Fact Sheet for Patients:  StrictlyIdeas.no Fact Sheet for Healthcare  Providers: BankingDealers.co.za This test is not yet approved or cleared by the [place] FDA and has been authorized for detection and/or diagnosis of SARS-CoV-2 by FDA under an Emergency Use Authorization (EUA).  This EUA will remain in effect (meaning this test can be used) for the duration of the COVID-19 declaration under Section 564(b)(1) of the Act, 21 U.S.C. section 360bbb-3(b)(1), unless the authorization is terminated or revoked sooner. Performed at [facility], [place], [place] [address]   Troponin I (High Sensitivity)     Status: Abnormal   Collection Time: 01/02/19  5:58 PM  Result Value Ref Range   Troponin I (High Sensitivity) 24 (H) <18 ng/L    Comment: (NOTE) Elevated high sensitivity troponin I (hsTnI) values and significant  changes across serial measurements may suggest ACS but many other  chronic and acute conditions are known to elevate hsTnI results.  Refer to the "Links" section for chest pain algorithms and additional  guidance. Performed at [facility], [address] [place], [place] [address]   CBC with Differential     Status: Abnormal   Collection Time: 02/01/19  9:31 AM  Result Value Ref Range   WBC 3.5 (L) 4.0 - 10.5 K/uL   RBC 4.40 4.22 - 5.81 MIL/uL   Hemoglobin 13.2 13.0 - 17.0 g/dL   HCT 41.0 39.0 - 52.0 %   MCV 93.2 80.0 - 100.0 fL   MCH 30.0 26.0 - 34.0 pg   MCHC 32.2 30.0 - 36.0 g/dL   RDW 16.7 (H) 11.5 - 15.5 %   Platelets 127 (L) 150 - 400 K/uL    Comment: Immature Platelet Fraction may be clinically indicated, consider ordering this additional test [id number]    nRBC 0.0 0.0 - 0.2 %   Neutrophils Relative % 46 %   Neutro Abs 1.6 (L) 1.7 - 7.7 K/uL   Lymphocytes Relative 20 %   Lymphs Abs 0.7 0.7 - 4.0 K/uL   Monocytes Relative 19 %   Monocytes Absolute 0.7 0.1 - 1.0 K/uL   Eosinophils Relative 14 %   Eosinophils Absolute 0.5 0.0 - 0.5 K/uL   Basophils Relative 1 %    Basophils Absolute 0.0 0.0 - 0.1 K/uL   Immature Granulocytes 0 %   Abs Immature Granulocytes 0.01 0.00 - 0.07 K/uL    Comment: Performed at [facility], [address], [place], [place] [address]  Basic metabolic panel     Status: Abnormal   Collection Time: 02/01/19  9:31 AM  Result Value Ref Range   Sodium 139 135 - 145 mmol/L   Potassium 3.8 3.5 - 5.1 mmol/L   Chloride 105 98 - 111 mmol/L   CO2 24 22 - 32 mmol/L   Glucose, Bld 151 (H) 70 - 99 mg/dL   BUN 13 8 - 23 mg/dL   Creatinine, Ser 0.93 0.61 - 1.24 mg/dL   Calcium 8.6 (L) 8.9 - 10.3 mg/dL   GFR calc non Af Amer >60 >60 mL/min   GFR calc Af Amer >60 >60 mL/min   Anion gap 10 5 - 15    Comment: Performed at [facility], [address], [place],  [address]  Magnesium     Status: None   Collection Time: 02/01/19  9:31 AM  Result Value Ref Range  Magnesium 2.0 1.7 - 2.4 mg/dL    Comment: Performed at [facility], [address] [place], [place] [address]    -------------------------------------------------------------------------- A&P:  Problem List Items Addressed This Visit    None    Visit Diagnoses    COPD with acute exacerbation ([place])       Relevant Medications   levofloxacin (LEVAQUIN) 500 MG tablet   predniSONE (DELTASONE) 20 MG tablet   Other Relevant Orders   Ambulatory referral to Pulmonology     Clinically with recurrent acute exac of COPD, seems incomplete resolution to last exacerbation Difficulty with limited office visit lately due to [place] restrictions, patient has other comorbid chronic conditions  [place] him on doxycycline intermittent regimen, do not have this record  Plan - Start taking Levaquin antibiotic 500mg  daily x 7 days - Start Prednisone taper over 7 days - Advised caution with repeat antibiotics steroids, need to pursue other therapy in future, he can continue Anoro and Albuterol PRN -  referral sent now to [place] for further evaluation, may warrant other COPD management, updated imaging if indicated, goal to limit flare ups  Strict return criteria given if worsening when to go back to UC vs ED   Orders Placed This Encounter  Procedures  . Ambulatory referral to Pulmonology    Referral Priority:   Routine    Referral Type:   Consultation    Referral Reason:   Specialty Services Required    Requested Specialty:   Pulmonary Disease    Number of Visits Requested:   1     Meds ordered this encounter  Medications  . levofloxacin (LEVAQUIN) 500 MG tablet    Sig: Take 1 tablet (500 mg total) by mouth daily. For 7 days    Dispense:  7 tablet    Refill:  0  . predniSONE (DELTASONE) 20 MG tablet    Sig: Take daily with food. Start with 60mg  (3 pills) x 2 days, then reduce to 40mg  (2 pills) x 2 days, then 20mg  (1 pill) x 3 days    Dispense:  13 tablet    Refill:  0    Follow-up: - Return in 1 week as needed for COPD  Patient verbalizes understanding with the above medical recommendations including the limitation of remote medical advice.  Specific follow-up and call-back criteria were given for patient to follow-up or seek medical care more urgently if needed.  - Time spent in direct consultation with patient on phone: 11 minutes  [name], [facility] 02/11/2019, 2:50 PM

## 2019-02-11 NOTE — Patient Instructions (Signed)
AVS given by phone. 

## 2019-02-13 ENCOUNTER — Encounter: Payer: Self-pay | Admitting: Emergency Medicine

## 2019-02-13 ENCOUNTER — Other Ambulatory Visit: Payer: Self-pay

## 2019-02-13 ENCOUNTER — Inpatient Hospital Stay
Admission: EM | Admit: 2019-02-13 | Discharge: 2019-02-18 | DRG: 885 | Disposition: A | Discharge status: Other Acute Inpatient Hospital | Payer: Medicare Other | Attending: Internal Medicine | Admitting: Internal Medicine

## 2019-02-13 ENCOUNTER — Emergency Department: Payer: Medicare Other

## 2019-02-13 DIAGNOSIS — K529 Noninfective gastroenteritis and colitis, unspecified: Secondary | ICD-10-CM | POA: Diagnosis present

## 2019-02-13 DIAGNOSIS — R609 Edema, unspecified: Secondary | ICD-10-CM

## 2019-02-13 DIAGNOSIS — M810 Age-related osteoporosis without current pathological fracture: Secondary | ICD-10-CM | POA: Diagnosis present

## 2019-02-13 DIAGNOSIS — R002 Palpitations: Secondary | ICD-10-CM | POA: Diagnosis not present

## 2019-02-13 DIAGNOSIS — Z818 Family history of other mental and behavioral disorders: Secondary | ICD-10-CM

## 2019-02-13 DIAGNOSIS — G629 Polyneuropathy, unspecified: Secondary | ICD-10-CM | POA: Diagnosis present

## 2019-02-13 DIAGNOSIS — Z751 Person awaiting admission to adequate facility elsewhere: Secondary | ICD-10-CM

## 2019-02-13 DIAGNOSIS — H3552 Pigmentary retinal dystrophy: Secondary | ICD-10-CM | POA: Diagnosis present

## 2019-02-13 DIAGNOSIS — I10 Essential (primary) hypertension: Secondary | ICD-10-CM | POA: Diagnosis present

## 2019-02-13 DIAGNOSIS — Z881 Allergy status to other antibiotic agents status: Secondary | ICD-10-CM

## 2019-02-13 DIAGNOSIS — F332 Major depressive disorder, recurrent severe without psychotic features: Principal | ICD-10-CM | POA: Diagnosis present

## 2019-02-13 DIAGNOSIS — R45851 Suicidal ideations: Secondary | ICD-10-CM | POA: Diagnosis present

## 2019-02-13 DIAGNOSIS — R079 Chest pain, unspecified: Secondary | ICD-10-CM

## 2019-02-13 DIAGNOSIS — F419 Anxiety disorder, unspecified: Secondary | ICD-10-CM | POA: Diagnosis present

## 2019-02-13 DIAGNOSIS — Z7982 Long term (current) use of aspirin: Secondary | ICD-10-CM

## 2019-02-13 DIAGNOSIS — Z915 Personal history of self-harm: Secondary | ICD-10-CM

## 2019-02-13 DIAGNOSIS — M79602 Pain in left arm: Secondary | ICD-10-CM | POA: Diagnosis present

## 2019-02-13 DIAGNOSIS — J432 Centrilobular emphysema: Secondary | ICD-10-CM | POA: Diagnosis present

## 2019-02-13 DIAGNOSIS — Z791 Long term (current) use of non-steroidal anti-inflammatories (NSAID): Secondary | ICD-10-CM

## 2019-02-13 DIAGNOSIS — Z9079 Acquired absence of other genital organ(s): Secondary | ICD-10-CM

## 2019-02-13 DIAGNOSIS — K219 Gastro-esophageal reflux disease without esophagitis: Secondary | ICD-10-CM | POA: Diagnosis present

## 2019-02-13 DIAGNOSIS — M545 Low back pain: Secondary | ICD-10-CM | POA: Diagnosis present

## 2019-02-13 DIAGNOSIS — F17220 Nicotine dependence, chewing tobacco, uncomplicated: Secondary | ICD-10-CM | POA: Diagnosis present

## 2019-02-13 DIAGNOSIS — G894 Chronic pain syndrome: Secondary | ICD-10-CM | POA: Diagnosis present

## 2019-02-13 DIAGNOSIS — F1010 Alcohol abuse, uncomplicated: Secondary | ICD-10-CM | POA: Diagnosis present

## 2019-02-13 DIAGNOSIS — M150 Primary generalized (osteo)arthritis: Secondary | ICD-10-CM | POA: Diagnosis present

## 2019-02-13 DIAGNOSIS — H409 Unspecified glaucoma: Secondary | ICD-10-CM | POA: Diagnosis present

## 2019-02-13 DIAGNOSIS — F10230 Alcohol dependence with withdrawal, uncomplicated: Secondary | ICD-10-CM

## 2019-02-13 DIAGNOSIS — Z9049 Acquired absence of other specified parts of digestive tract: Secondary | ICD-10-CM

## 2019-02-13 DIAGNOSIS — Z8249 Family history of ischemic heart disease and other diseases of the circulatory system: Secondary | ICD-10-CM

## 2019-02-13 DIAGNOSIS — F10930 Alcohol use, unspecified with withdrawal, uncomplicated: Secondary | ICD-10-CM

## 2019-02-13 DIAGNOSIS — H547 Unspecified visual loss: Secondary | ICD-10-CM | POA: Diagnosis present

## 2019-02-13 DIAGNOSIS — Z79899 Other long term (current) drug therapy: Secondary | ICD-10-CM

## 2019-02-13 DIAGNOSIS — M4810 Ankylosing hyperostosis [Forestier], site unspecified: Secondary | ICD-10-CM | POA: Diagnosis present

## 2019-02-13 DIAGNOSIS — R739 Hyperglycemia, unspecified: Secondary | ICD-10-CM | POA: Diagnosis present

## 2019-02-13 DIAGNOSIS — G4700 Insomnia, unspecified: Secondary | ICD-10-CM | POA: Diagnosis present

## 2019-02-13 DIAGNOSIS — R0609 Other forms of dyspnea: Secondary | ICD-10-CM

## 2019-02-13 DIAGNOSIS — I739 Peripheral vascular disease, unspecified: Secondary | ICD-10-CM | POA: Diagnosis present

## 2019-02-13 DIAGNOSIS — Z20828 Contact with and (suspected) exposure to other viral communicable diseases: Secondary | ICD-10-CM | POA: Diagnosis present

## 2019-02-13 DIAGNOSIS — R21 Rash and other nonspecific skin eruption: Secondary | ICD-10-CM | POA: Diagnosis present

## 2019-02-13 LAB — CBC WITH DIFFERENTIAL/PLATELET
Abs Immature Granulocytes: 0.06 10*3/uL (ref 0.00–0.07)
Basophils Absolute: 0 10*3/uL (ref 0.0–0.1)
Basophils Relative: 1 %
Eosinophils Absolute: 0 10*3/uL (ref 0.0–0.5)
Eosinophils Relative: 0 %
HCT: 45.2 % (ref 39.0–52.0)
Hemoglobin: 14.9 g/dL (ref 13.0–17.0)
Immature Granulocytes: 1 %
Lymphocytes Relative: 9 %
Lymphs Abs: 0.5 10*3/uL — ABNORMAL LOW (ref 0.7–4.0)
MCH: 30.3 pg (ref 26.0–34.0)
MCHC: 33 g/dL (ref 30.0–36.0)
MCV: 92.1 fL (ref 80.0–100.0)
Monocytes Absolute: 0.2 10*3/uL (ref 0.1–1.0)
Monocytes Relative: 3 %
Neutro Abs: 4.5 10*3/uL (ref 1.7–7.7)
Neutrophils Relative %: 86 %
Platelets: 166 10*3/uL (ref 150–400)
RBC: 4.91 MIL/uL (ref 4.22–5.81)
RDW: 16.2 % — ABNORMAL HIGH (ref 11.5–15.5)
WBC: 5.3 10*3/uL (ref 4.0–10.5)
nRBC: 0 % (ref 0.0–0.2)

## 2019-02-13 LAB — BASIC METABOLIC PANEL
Anion gap: 13 (ref 5–15)
BUN: 22 mg/dL (ref 8–23)
CO2: 21 mmol/L — ABNORMAL LOW (ref 22–32)
Calcium: 9.2 mg/dL (ref 8.9–10.3)
Chloride: 108 mmol/L (ref 98–111)
Creatinine, Ser: 0.91 mg/dL (ref 0.61–1.24)
GFR calc Af Amer: >60 mL/min (ref >60)
GFR calc non Af Amer: >60 mL/min (ref >60)
Glucose, Bld: 251 mg/dL — ABNORMAL HIGH (ref 70–99)
Potassium: 4.3 mmol/L (ref 3.5–5.1)
Sodium: 142 mmol/L (ref 135–145)

## 2019-02-13 LAB — TROPONIN I (HIGH SENSITIVITY)
Troponin I (High Sensitivity): 15 ng/L (ref <18)
Troponin I (High Sensitivity): 19 ng/L — ABNORMAL HIGH (ref <18)

## 2019-02-13 LAB — ETHANOL: Alcohol, Ethyl (B): <10 mg/dL (ref <10)

## 2019-02-13 LAB — SARS CORONAVIRUS 2 BY RT PCR (HOSPITAL ORDER, PERFORMED IN ~~LOC~~ HOSPITAL LAB): SARS Coronavirus 2: NEGATIVE

## 2019-02-13 LAB — BRAIN NATRIURETIC PEPTIDE: B Natriuretic Peptide: 35 pg/mL (ref 0.0–100.0)

## 2019-02-13 MED ORDER — LORAZEPAM 2 MG/ML IJ SOLN
2.0000 mg | Freq: Once | INTRAMUSCULAR | Status: AC
  Administered 2019-02-13: 2 mg via INTRAVENOUS
  Filled 2019-02-13: qty 1

## 2019-02-13 MED ORDER — ENOXAPARIN SODIUM 40 MG/0.4ML ~~LOC~~ SOLN
40.0000 mg | SUBCUTANEOUS | Status: DC
  Administered 2019-02-14 – 2019-02-18 (×4): 40 mg via SUBCUTANEOUS
  Filled 2019-02-13 (×4): qty 0.4

## 2019-02-13 MED ORDER — FERROUS SULFATE 325 (65 FE) MG PO TABS
325.0000 mg | ORAL_TABLET | Freq: Every day | ORAL | Status: DC
  Administered 2019-02-14 – 2019-02-18 (×5): 325 mg via ORAL
  Filled 2019-02-13 (×5): qty 1

## 2019-02-13 MED ORDER — UMECLIDINIUM-VILANTEROL 62.5-25 MCG/INH IN AEPB
1.0000 | INHALATION_SPRAY | Freq: Every day | RESPIRATORY_TRACT | Status: DC
  Administered 2019-02-14 – 2019-02-18 (×5): 1 via RESPIRATORY_TRACT
  Filled 2019-02-13: qty 14

## 2019-02-13 MED ORDER — BISACODYL 5 MG PO TBEC: 5.0000 mg | DELAYED_RELEASE_TABLET | Freq: Every day | ORAL | Status: DC | PRN

## 2019-02-13 MED ORDER — ALBUTEROL SULFATE (2.5 MG/3ML) 0.083% IN NEBU: 2.5000 mg | INHALATION_SOLUTION | RESPIRATORY_TRACT | Status: DC | PRN

## 2019-02-13 MED ORDER — AMLODIPINE BESYLATE 10 MG PO TABS
10.0000 mg | ORAL_TABLET | Freq: Every day | ORAL | Status: DC
  Administered 2019-02-14 – 2019-02-18 (×5): 10 mg via ORAL
  Filled 2019-02-13 (×5): qty 1

## 2019-02-13 MED ORDER — ONDANSETRON HCL 4 MG PO TABS: 4.0000 mg | ORAL_TABLET | Freq: Four times a day (QID) | ORAL | Status: DC | PRN

## 2019-02-13 MED ORDER — ASPIRIN EC 81 MG PO TBEC
81.0000 mg | DELAYED_RELEASE_TABLET | Freq: Every day | ORAL | Status: DC
  Administered 2019-02-14 – 2019-02-18 (×5): 81 mg via ORAL
  Filled 2019-02-13 (×5): qty 1

## 2019-02-13 MED ORDER — FENOFIBRATE 160 MG PO TABS
160.0000 mg | ORAL_TABLET | Freq: Every day | ORAL | Status: DC
  Administered 2019-02-14 – 2019-02-18 (×5): 160 mg via ORAL
  Filled 2019-02-13 (×6): qty 1

## 2019-02-13 MED ORDER — INSULIN ASPART 100 UNIT/ML ~~LOC~~ SOLN
0.0000 [IU] | Freq: Three times a day (TID) | SUBCUTANEOUS | Status: DC
  Administered 2019-02-14 – 2019-02-15 (×3): 1 [IU] via SUBCUTANEOUS
  Filled 2019-02-13 (×3): qty 1

## 2019-02-13 MED ORDER — DULOXETINE HCL 60 MG PO CPEP
60.0000 mg | ORAL_CAPSULE | Freq: Every day | ORAL | Status: DC
  Administered 2019-02-14 – 2019-02-18 (×5): 60 mg via ORAL
  Filled 2019-02-13: qty 2
  Filled 2019-02-13: qty 1
  Filled 2019-02-13 (×2): qty 2
  Filled 2019-02-13: qty 1

## 2019-02-13 MED ORDER — QUETIAPINE FUMARATE 25 MG PO TABS
50.0000 mg | ORAL_TABLET | Freq: Every day | ORAL | Status: DC
  Administered 2019-02-14 – 2019-02-17 (×5): 50 mg via ORAL
  Filled 2019-02-13 (×5): qty 2

## 2019-02-13 MED ORDER — PANTOPRAZOLE SODIUM 40 MG PO TBEC
40.0000 mg | DELAYED_RELEASE_TABLET | Freq: Two times a day (BID) | ORAL | Status: DC
  Administered 2019-02-14 – 2019-02-18 (×9): 40 mg via ORAL
  Filled 2019-02-13 (×10): qty 1

## 2019-02-13 MED ORDER — ACETAMINOPHEN 650 MG RE SUPP: 650.0000 mg | Freq: Four times a day (QID) | RECTAL | Status: DC | PRN

## 2019-02-13 MED ORDER — MORPHINE SULFATE (PF) 4 MG/ML IV SOLN: 4.0000 mg | INTRAVENOUS | Status: DC | PRN

## 2019-02-13 MED ORDER — PREGABALIN 75 MG PO CAPS
150.0000 mg | ORAL_CAPSULE | Freq: Three times a day (TID) | ORAL | Status: DC
  Administered 2019-02-14 – 2019-02-18 (×13): 150 mg via ORAL
  Filled 2019-02-13 (×14): qty 2

## 2019-02-13 MED ORDER — ONDANSETRON HCL 4 MG/2ML IJ SOLN: 4.0000 mg | Freq: Four times a day (QID) | INTRAMUSCULAR | Status: DC | PRN

## 2019-02-13 MED ORDER — ACETAMINOPHEN 325 MG PO TABS
650.0000 mg | ORAL_TABLET | Freq: Four times a day (QID) | ORAL | Status: DC | PRN
  Administered 2019-02-15 – 2019-02-18 (×3): 650 mg via ORAL
  Filled 2019-02-13 (×3): qty 2

## 2019-02-13 MED ORDER — SENNOSIDES-DOCUSATE SODIUM 8.6-50 MG PO TABS: 1.0000 | ORAL_TABLET | Freq: Every evening | ORAL | Status: DC | PRN

## 2019-02-13 MED ORDER — OXYCODONE HCL 5 MG PO TABS
10.0000 mg | ORAL_TABLET | ORAL | Status: AC
  Administered 2019-02-13: 10 mg via ORAL
  Filled 2019-02-13: qty 2

## 2019-02-13 MED ORDER — PREGABALIN 75 MG PO CAPS
75.0000 mg | ORAL_CAPSULE | ORAL | Status: AC
  Administered 2019-02-13: 75 mg via ORAL
  Filled 2019-02-13: qty 1

## 2019-02-13 MED ORDER — TIMOLOL MALEATE 0.25 % OP SOLN
1.0000 [drp] | Freq: Two times a day (BID) | OPHTHALMIC | Status: DC
  Administered 2019-02-14 (×2): 2 [drp] via OPHTHALMIC
  Administered 2019-02-14: 1 [drp] via OPHTHALMIC
  Administered 2019-02-15 (×2): 2 [drp] via OPHTHALMIC
  Administered 2019-02-16: 22:00:00 1 [drp] via OPHTHALMIC
  Administered 2019-02-16 – 2019-02-17 (×3): 2 [drp] via OPHTHALMIC
  Administered 2019-02-18: 1 [drp] via OPHTHALMIC
  Filled 2019-02-13: qty 5

## 2019-02-13 MED ORDER — HYDROCODONE-ACETAMINOPHEN 5-325 MG PO TABS
1.0000 | ORAL_TABLET | ORAL | Status: DC | PRN
  Administered 2019-02-14: 1 via ORAL
  Filled 2019-02-13: qty 1

## 2019-02-13 NOTE — ED Notes (Signed)
Admitting MD at bedside.

## 2019-02-13 NOTE — ED Notes (Signed)
Informed MD of daughter in room who would like to speak to him. Also advised MD of new onset abd pain

## 2019-02-13 NOTE — ED Notes (Addendum)
Pt has been non cooperative since arrival.  Pt constantly interrupting triage process asking for water after told multiple times that he needs to be seen by the EDP first and chest xray resulted per MD.  Pt refusing to keep monitoring cords in place, continuously getting out of bed.  This RN has been at bedside since arrival speaking with patient about importance of staying in bed for safety and concerns of falling.  Patient continues to get out of bed.  Non slick socks placed on patient and call bell placed within reach and taught how to use the call bell and when.  Pt verbally acknowledges teachings but continues to get up and not hit call bell.  Pt also now complaining about chronic pain and wanting home meds.  MD notified and order placed.  Verbal contract by this RN and patient that once he has pain meds he needs to stay in bed for said reasons prior.  Pt given meds and 7 cups of water that he has demanded.  Pt also has been sat in a blue recliner chair for more comfort ability with call bell within reach.  Will continue to monitor.

## 2019-02-13 NOTE — ED Notes (Signed)
ED TO INPATIENT HANDOFF REPORT  ED Nurse Name and Phone #: Jerrik Housholder [phone]  S Name/Age/Gender Aaron Gibbs 84 y.o. male Room/Bed: ED10A/ED10A  Code Status   Code Status: Prior  Home/SNF/Other Home Patient oriented to: self, place, time and situation Is this baseline? Yes   Triage Complete: Triage complete  Chief Complaint palpitations  Triage Note Pt via EMS from home. Pt states he feels his heart is racing. Also c/o L arm pain that started about an hour ago.    Allergies Allergies  Allergen Reactions  . Azithromycin Shortness Of Breath    Level of Care/Admitting Diagnosis ED Disposition    ED Disposition Condition Comment   Admit  The patient appears reasonably stabilized for admission considering the current resources, flow, and capabilities available in the ED at this time, and I doubt any other [facility] requiring further screening and/or treatment in the ED prior to admission is  present.       B Medical/Surgery History Past Medical History:  Diagnosis Date  . Anxiety   . Cancer ([place])    skin  . COPD (chronic obstructive pulmonary disease) ([place])   . Glaucoma   . Osteoporosis    osteopenia   Past Surgical History:  Procedure Laterality Date  . CHOLECYSTECTOMY    . TRANSURETHRAL RESECTION OF PROSTATE N/A 2008     A IV Location/Drains/Wounds Patient Lines/Drains/Airways Status   Active Line/Drains/Airways    None          Intake/Output Last 24 hours No intake or output data in the 24 hours ending 02/13/19 2123  Labs/Imaging Results for orders placed or performed during the hospital encounter of 02/13/19 (from the past 48 hour(s))  Basic metabolic panel     Status: Abnormal   Collection Time: 02/13/19  5:48 PM  Result Value Ref Range   Sodium 142 135 - 145 mmol/L   Potassium 4.3 3.5 - 5.1 mmol/L   Chloride 108 98 - 111 mmol/L   CO2 21 (L) 22 - 32 mmol/L   Glucose, Bld 251 (H) 70 - 99 mg/dL   BUN 22 8 - 23 mg/dL   Creatinine, Ser 0.91 0.61 -  1.24 mg/dL   Calcium 9.2 8.9 - 10.3 mg/dL   GFR calc non Af Amer >60 >60 mL/min   GFR calc Af Amer >60 >60 mL/min   Anion gap 13 5 - 15    Comment: Performed at [facility], [address] [place], [place] [address]  Brain natriuretic peptide     Status: None   Collection Time: 02/13/19  5:48 PM  Result Value Ref Range   B Natriuretic Peptide 35.0 0.0 - 100.0 pg/mL    Comment: Performed at [facility], [place], [place] [address]  Troponin I (High Sensitivity)     Status: None   Collection Time: 02/13/19  5:48 PM  Result Value Ref Range   Troponin I (High Sensitivity) 15 <18 ng/L    Comment: (NOTE) Elevated high sensitivity troponin I (hsTnI) values and significant  changes across serial measurements may suggest ACS but many other  chronic and acute conditions are known to elevate hsTnI results.  Refer to the "Links" section for chest pain algorithms and additional  guidance. Performed at [facility], [address] [place],  [address]   CBC with Differential     Status: Abnormal   Collection Time: 02/13/19  5:48 PM  Result Value Ref Range   WBC 5.3 4.0 - 10.5  K/uL   RBC 4.91 4.22 - 5.81 MIL/uL   Hemoglobin 14.9 13.0 - 17.0 g/dL   HCT 45.2 39.0 - 52.0 %   MCV 92.1 80.0 - 100.0 fL   MCH 30.3 26.0 - 34.0 pg   MCHC 33.0 30.0 - 36.0 g/dL   RDW 16.2 (H) 11.5 - 15.5 %   Platelets 166 150 - 400 K/uL   nRBC 0.0 0.0 - 0.2 %   Neutrophils Relative % 86 %   Neutro Abs 4.5 1.7 - 7.7 K/uL   Lymphocytes Relative 9 %   Lymphs Abs 0.5 (L) 0.7 - 4.0 K/uL   Monocytes Relative 3 %   Monocytes Absolute 0.2 0.1 - 1.0 K/uL   Eosinophils Relative 0 %   Eosinophils Absolute 0.0 0.0 - 0.5 K/uL   Basophils Relative 1 %   Basophils Absolute 0.0 0.0 - 0.1 K/uL   Immature Granulocytes 1 %   Abs Immature Granulocytes 0.06 0.00 - 0.07 K/uL    Comment: Performed at [facility], [address], [place], [place] [address]  SARS  Coronavirus 2 [facility] order, Performed in [facility] hospital lab) Nasopharyngeal Nasopharyngeal Swab     Status: None   Collection Time: 02/13/19  5:48 PM   Specimen: Nasopharyngeal Swab  Result Value Ref Range   SARS Coronavirus 2 NEGATIVE NEGATIVE    Comment: (NOTE) If result is NEGATIVE SARS-CoV-2 target nucleic acids are NOT DETECTED. The SARS-CoV-2 RNA is generally detectable in upper and lower  respiratory specimens during the acute phase of infection. The lowest  concentration of SARS-CoV-2 viral copies this assay can detect is 250  copies / mL. A negative result does not preclude SARS-CoV-2 infection  and should not be used as the sole basis for treatment or other  patient management decisions.  A negative result may occur with  improper specimen collection / handling, submission of specimen other  than nasopharyngeal swab, presence of viral mutation(s) within the  areas targeted by this assay, and inadequate number of viral copies  (<250 copies / mL). A negative result must be combined with clinical  observations, patient history, and epidemiological information. If result is POSITIVE SARS-CoV-2 target nucleic acids are DETECTED. The SARS-CoV-2 RNA is generally detectable in upper and lower  respiratory specimens dur ing the acute phase of infection.  Positive  results are indicative of active infection with SARS-CoV-2.  Clinical  correlation with patient history and other diagnostic information is  necessary to determine patient infection status.  Positive results do  not rule out bacterial infection or co-infection with other viruses. If result is PRESUMPTIVE POSTIVE SARS-CoV-2 nucleic acids MAY BE PRESENT.   A presumptive positive result was obtained on the submitted specimen  and confirmed on repeat testing.  While 2019 novel coronavirus  (SARS-CoV-2) nucleic acids may be present in the submitted sample  additional confirmatory testing may be necessary for  epidemiological  and / or clinical management purposes  to differentiate between  SARS-CoV-2 and other Sarbecovirus currently known to infect humans.  If clinically indicated additional testing with an alternate test  methodology [phone]) is advised. The SARS-CoV-2 RNA is generally  detectable in upper and lower respiratory sp ecimens during the acute  phase of infection. The expected result is Negative. Fact Sheet for Patients:  StrictlyIdeas.no Fact Sheet for Healthcare Providers: BankingDealers.co.za This test is not yet approved or cleared by the [place] FDA and has been authorized for detection and/or diagnosis of SARS-CoV-2 by FDA under an Emergency  Use Authorization (EUA).  This EUA will remain in effect (meaning this test can be used) for the duration of the COVID-19 declaration under Section 564(b)(1) of the Act, 21 U.S.C. section 360bbb-3(b)(1), unless the authorization is terminated or revoked sooner. Performed at [facility], [address] [place], [place] [address]   Ethanol     Status: None   Collection Time: 02/13/19  5:48 PM  Result Value Ref Range   Alcohol, Ethyl (B) <10 <10 mg/dL    Comment: (NOTE) Lowest detectable limit for serum alcohol is 10 mg/dL. For medical purposes only. Performed at [facility], [place], [place] [address]   Troponin I (High Sensitivity)     Status: Abnormal   Collection Time: 02/13/19  7:45 PM  Result Value Ref Range   Troponin I (High Sensitivity) 19 (H) <18 ng/L    Comment: (NOTE) Elevated high sensitivity troponin I (hsTnI) values and significant  changes across serial measurements may suggest ACS but many other  chronic and acute conditions are known to elevate hsTnI results.  Refer to the "Links" section for chest pain algorithms and additional  guidance. Performed at [facility], [address] [place], [place]  [address]    Dg Chest Portable 1 View  Result Date: 02/13/2019 CLINICAL DATA:  Dyspnea, tachycardia right arm pain for 1 hour, former smoker EXAM: PORTABLE CHEST 1 VIEW COMPARISON:  Radiograph February 01, 2019, CT July 02, 2018 FINDINGS: Lung volumes are diminished. Streaky opacities in the lungs are favored to reflect atelectasis versus early infection. No pneumothorax or effusion. Cardiomediastinal contours are unremarkable. No acute osseous or soft tissue abnormality. Degenerative changes are present in the imaged spine and shoulders. IMPRESSION: Low volumes with streaky opacities in the bases favored to reflect atelectasis versus early infection. Electronically Signed   By: Lovena Le M.D.   On: 02/13/2019 18:04    Pending Labs Unresulted Labs (From admission, onward)   None      Vitals/Pain Today's Vitals   02/13/19 1739 02/13/19 1746 02/13/19 1945 02/13/19 2040  BP:  (!) 183/93  (!) 150/73  Pulse:  100  88  Resp:  (!) 24  (!) 24  Temp:  (!) 97.5 F (36.4 C)    TempSrc:  Oral    SpO2:  94%  93%  Weight: 117.5 kg     Height: 5\' 9"  (1.753 m)     PainSc:   7      Isolation Precautions Airborne and Contact precautions  Medications Medications  oxyCODONE (Oxy IR/ROXICODONE) immediate release tablet 10 mg (10 mg Oral Given 02/13/19 1839)  pregabalin (LYRICA) capsule 75 mg (75 mg Oral Given 02/13/19 2105)  LORazepam (ATIVAN) injection 2 mg (2 mg Intravenous Given 02/13/19 2106)    Mobility walks Moderate fall risk   Focused Assessments Cardiac Assessment Handoff:  Cardiac Rhythm: Sinus tachycardia Lab Results  Component Value Date   TROPONINI <0.03 07/30/2018   No results found for: DDIMER Does the Patient currently have chest pain? No     R Recommendations: See Admitting Provider Note  Report given to:   Additional Notes: pt has abd pain, have attempted to treat, will reassess

## 2019-02-13 NOTE — ED Notes (Signed)
PT still refusing to wear heart monitor, but pt agreed to wear SpO2 monitor

## 2019-02-13 NOTE — ED Notes (Signed)
This RN answered pt's call bell for pt to state that his stomach is still hurting. PT requesting milk. Informed pt that it would be best to wait on oral intake until MD is able to assess this new pain

## 2019-02-13 NOTE — H&P (Addendum)
[place] at [place] NAME: Aaron Gibbs    MR#:  [id number]  DATE OF BIRTH:  24-Apr-1935  DATE OF ADMISSION:  02/13/2019  PRIMARY CARE PHYSICIAN: Olin Hauser, DO   REQUESTING/REFERRING PHYSICIAN: Carrie Mew, MD  CHIEF COMPLAINT:   Chief Complaint  Patient presents with  . Palpitations   Palpitation. HISTORY OF PRESENT ILLNESS:  Aaron Gibbs  is a 84 y.o. male with a known history of hypertension, anxiety, skin cancer, COPD, osteoporosis and glaucoma.  He present to ED with above chief complaints.  The patient complains of epigastric pain for 1 day, which is intermittent, aching without radiation.  He complains of nausea, almost vomiting and diarrhea.  He has generalized weakness, mild shortness of breath and nonproductive cough.  He was treated with antibiotics recently.  ED physician request admission. PAST MEDICAL HISTORY:   Past Medical History:  Diagnosis Date  . Anxiety   . Cancer ([place])    skin  . COPD (chronic obstructive pulmonary disease) ([place])   . Glaucoma   . Osteoporosis    osteopenia    PAST SURGICAL HISTORY:   Past Surgical History:  Procedure Laterality Date  . CHOLECYSTECTOMY    . TRANSURETHRAL RESECTION OF PROSTATE N/A 2008    SOCIAL HISTORY:   Social History   Tobacco Use  . Smoking status: Former Smoker    Years: 15.00  . Smokeless tobacco: Current User    Types: Chew  . Tobacco comment: stopped 15 years ago  Substance Use Topics  . Alcohol use: Yes    Comment: past    FAMILY HISTORY:   Family History  Problem Relation Age of Onset  . Depression Mother   . Heart disease Father     DRUG ALLERGIES:   Allergies  Allergen Reactions  . Azithromycin Shortness Of Breath    REVIEW OF SYSTEMS:   Review of Systems  Constitutional: Negative for chills, fever and malaise/fatigue.  HENT: Negative for sore throat.   Eyes: Negative for blurred vision and double  vision.  Respiratory: Negative for cough, hemoptysis, shortness of breath, wheezing and stridor.   Cardiovascular: Positive for palpitations. Negative for chest pain, orthopnea and leg swelling.  Gastrointestinal: Positive for abdominal pain, diarrhea and nausea. Negative for blood in stool, melena and vomiting.  Genitourinary: Negative for dysuria, flank pain and hematuria.  Musculoskeletal: Negative for back pain and joint pain.  Skin: Positive for rash.  Neurological: Negative for dizziness, sensory change, focal weakness, seizures, loss of consciousness, weakness and headaches.  Endo/Heme/Allergies: Negative for polydipsia.  Psychiatric/Behavioral: Negative for depression. The patient is not nervous/anxious.     MEDICATIONS AT HOME:   Prior to Admission medications   Medication Sig Start Date End Date Taking? Authorizing Provider  albuterol (VENTOLIN HFA) 108 (90 Base) MCG/ACT inhaler Inhale 1-2 puffs into the lungs every 6 (six) hours as needed for wheezing or shortness of breath. 02/01/19  Yes Norval Gable, MD  amLODipine (NORVASC) 10 MG tablet Take 10 mg by mouth daily.   Yes [provider]  aspirin EC 81 MG tablet Take 81 mg by mouth daily.   Yes [provider]  DULoxetine (CYMBALTA) 60 MG capsule Take 60 mg by mouth daily.  11/26/18  Yes [provider]  fenofibrate (TRICOR) 145 MG tablet Take 145 mg by mouth daily.   Yes [provider]  ferrous sulfate 325 (65 FE) MG tablet Take 325 mg by mouth daily with breakfast.  Yes [provider]  levofloxacin (LEVAQUIN) 500 MG tablet Take 1 tablet (500 mg total) by mouth daily. For 7 days 02/11/19  Yes Karamalegos, Devonne Doughty, DO  meloxicam (MOBIC) 15 MG tablet Take 1 tablet (15 mg total) by mouth daily. 03/17/19 06/15/19 Yes Milinda Pointer, MD  Multiple Vitamin (MULTIVITAMIN WITH MINERALS) TABS tablet Take 1 tablet by mouth daily.   Yes [provider]  Oxycodone HCl 10 MG TABS  Take 1 tablet (10 mg total) by mouth 3 (three) times daily as needed. Must last 30 days. 02/24/19 03/26/19 Yes Milinda Pointer, MD  pantoprazole (PROTONIX) 40 MG tablet Take 40 mg by mouth 2 (two) times daily.   Yes [provider]  predniSONE (DELTASONE) 20 MG tablet Take daily with food. Start with 60mg  (3 pills) x 2 days, then reduce to 40mg  (2 pills) x 2 days, then 20mg  (1 pill) x 3 days 02/11/19  Yes Karamalegos, Devonne Doughty, DO  pregabalin (LYRICA) 150 MG capsule Take 1 capsule (150 mg total) by mouth 3 (three) times daily. Must last 30 days 03/17/19 06/15/19 Yes Milinda Pointer, MD  QUEtiapine (SEROQUEL) 50 MG tablet Take 50 mg by mouth at bedtime.  11/22/18  Yes [provider]  timolol (BETIMOL) 0.25 % ophthalmic solution 1-2 drops 2 (two) times daily.   Yes [provider]  umeclidinium-vilanterol (ANORO ELLIPTA) 62.5-25 MCG/INH AEPB Inhale 1 puff into the lungs daily.   Yes [provider]      VITAL SIGNS:  Blood pressure (!) 150/73, pulse 88, temperature (!) 97.5 F (36.4 C), temperature source Oral, resp. rate (!) 24, height 5\' 9"  (1.753 m), weight 117.5 kg, SpO2 93 %.  PHYSICAL EXAMINATION:  Physical Exam  GENERAL:  84 y.o.-year-old patient lying in the bed with no acute distress.  EYES: Pupils equal, round, reactive to light and accommodation. No scleral icterus. Extraocular muscles intact.  HEENT: Head atraumatic, normocephalic. Oropharynx and nasopharynx clear.  NECK:  Supple, no jugular venous distention. No thyroid enlargement, no tenderness.  LUNGS: Normal breath sounds bilaterally, no wheezing, rales,rhonchi or crepitation. No use of accessory muscles of respiration.  CARDIOVASCULAR: S1, S2 normal. No murmurs, rubs, or gallops.  ABDOMEN: Soft, nontender, nondistended. Bowel sounds present. No organomegaly or mass.  EXTREMITIES: No pedal edema, cyanosis, or clubbing.  NEUROLOGIC: Cranial nerves II through XII are intact. Muscle strength  5/5 in all extremities. Sensation intact. Gait not checked.  PSYCHIATRIC: The patient is alert and oriented x 3.  SKIN: No obvious rash, lesion, or ulcer.   LABORATORY PANEL:   CBC Recent Labs  Lab 02/13/19 1748  WBC 5.3  HGB 14.9  HCT 45.2  PLT 166   ------------------------------------------------------------------------------------------------------------------  Chemistries  Recent Labs  Lab 02/13/19 1748  NA 142  K 4.3  CL 108  CO2 21*  GLUCOSE 251*  BUN 22  CREATININE 0.91  CALCIUM 9.2   ------------------------------------------------------------------------------------------------------------------  Cardiac Enzymes No results for input(s): TROPONINI in the last 168 hours. ------------------------------------------------------------------------------------------------------------------  RADIOLOGY:  Dg Chest Portable 1 View  Result Date: 02/13/2019 CLINICAL DATA:  Dyspnea, tachycardia right arm pain for 1 hour, former smoker EXAM: PORTABLE CHEST 1 VIEW COMPARISON:  Radiograph February 01, 2019, CT July 02, 2018 FINDINGS: Lung volumes are diminished. Streaky opacities in the lungs are favored to reflect atelectasis versus early infection. No pneumothorax or effusion. Cardiomediastinal contours are unremarkable. No acute osseous or soft tissue abnormality. Degenerative changes are present in the imaged spine and shoulders. IMPRESSION: Low volumes with streaky opacities  in the bases favored to reflect atelectasis versus early infection. Electronically Signed   By: Lovena Le M.D.   On: 02/13/2019 18:04      IMPRESSION AND PLAN:   Palpitation Patient will be placed for observation.  Telemetry monitor.  Follow-up troponin level, continue aspirin and fenofibrate. Epigastric abdominal pain with nausea and diarrhea.  Possible due to acute gastroenteritis. Start Protonix, Zofran as needed.  Supportive care.  Hyperglycemia.  Check hemoglobin A1c.  Start sliding scale.  Anxiety.  Continue anxiety medication. COPD.  Stable.  Nebulizer as needed.  Continue home medication.  All the records are reviewed and case discussed with ED provider. Management plans discussed with the patient, family and they are in agreement.  CODE STATUS: Full code  TOTAL TIME TAKING CARE OF THIS PATIENT: 42 minutes.    Demetrios Loll M.D on 02/13/2019 at 9:32 PM  Between 7am to 6pm - Pager - [phone]  After 6pm go to [email or web address] - Proofreader  [employer] [facility]  Office  [phone]  CC: Primary care physician; Olin Hauser, DO   Note: This dictation was prepared with Dragon dictation along with smaller phrase technology. Any transcriptional errors that result from this process are unin

## 2019-02-13 NOTE — ED Provider Notes (Signed)
[facility] Emergency Department Provider Note  ____________________________________________  Time seen: Approximately 7:40 PM  I have reviewed the triage vital signs and the nursing notes.   HISTORY  Chief Complaint Palpitations    HPI Rider Lockard is a 84 y.o. male is a history of anxiety COPD hypertension depression alcohol abuse who comes the ED complaining of feeling like his heart is racing.  Also complains of pain in the left arm that is nonradiating without aggravating or alleviating factors.  Has some mild shortness of breath and nonproductive cough.  Was started on Levaquin 2 days ago by his doctor.  Also reports being on prednisone and doxycycline recently.  Reports decreased oral intake the last few days due to malaise and loss of appetite.  Review of electronic medical record shows he was last tested for COVID on January 02, 2019.    Past Medical History:  Diagnosis Date  . Anxiety   . Cancer ([place])    skin  . COPD (chronic obstructive pulmonary disease) ([place])   . Glaucoma   . Osteoporosis    osteopenia     Patient Active Problem List   Diagnosis Date Noted  . Preop testing 11/14/2018  . Noncompliance with medication treatment due to overuse of medication 10/23/2018  . Neurogenic pain 08/20/2018  . Atherosclerotic peripheral vascular disease ([place]) 07/24/2018  . Tricompartment osteoarthritis of knee (Left) 07/24/2018  . Osteoarthritis of knee (Bilateral) 07/24/2018  . Osteoarthritis of patellofemoral joint (Right) 07/24/2018  . History of suicide attempt (06/12/18) 07/24/2018  . Long term current use of opiate analgesic 07/10/2018  . Pharmacologic therapy 07/10/2018  . Disorder of skeletal system 07/10/2018  . Problems influencing health status 07/10/2018  . Suicide attempt ([place]) (05/13/18) 06/26/2018  . Syncope and collapse 06/04/2018  . 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 Recurrent major depression-severe ([place]) 06/04/2018  .  Blindness 05/10/2018  . Suicidal ideation 05/10/2018  . Chronic pain syndrome 05/01/2018  . DISH (diffuse idiopathic skeletal hyperostosis) 05/01/2018  . Osteoarthritis of multiple joints 05/01/2018  . Chronic knee pain (Primary Area of Pain) (Bilateral) (L>R) 05/01/2018  . Retinitis pigmentosa of both eyes 05/01/2018  . Glaucoma of both eyes 05/01/2018  . Essential hypertension 05/01/2018  . Chronic low back pain (Bilateral) w/o sciatica 05/01/2018  . GERD (gastroesophageal reflux disease) 05/01/2018  . 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 Therapeutic opioid-induced constipation (OIC) 05/01/2018  . Centrilobular emphysema ([place]) 05/01/2018     Past Surgical History:  Procedure Laterality Date  . CHOLECYSTECTOMY    . TRANSURETHRAL RESECTION OF PROSTATE N/A 2008     Prior to Admission medications   Medication Sig Start Date End Date Taking? Authorizing Provider  albuterol (VENTOLIN HFA) 108 (90 Base) MCG/ACT inhaler Inhale 1-2 puffs into the lungs every 6 (six) hours as needed for wheezing or shortness of breath. 02/01/19  Yes Norval Gable, MD  amLODipine (NORVASC) 10 MG tablet Take 10 mg by mouth daily.   Yes [provider]  aspirin EC 81 MG tablet Take 81 mg by mouth daily.   Yes [provider]  DULoxetine (CYMBALTA) 60 MG capsule Take 60 mg by mouth daily.  11/26/18  Yes [provider]  fenofibrate (TRICOR) 145 MG tablet Take 145 mg by mouth daily.   Yes [provider]  ferrous sulfate 325 (65 FE) MG tablet Take 325 mg by mouth daily with breakfast.   Yes [provider]  levofloxacin (LEVAQUIN) 500 MG tablet Take 1 tablet (500  mg total) by mouth daily. For 7 days 02/11/19  Yes Karamalegos, Devonne Doughty, DO  meloxicam (MOBIC) 15 MG tablet Take 1 tablet (15 mg total) by mouth daily. 03/17/19 06/15/19 Yes Milinda Pointer, MD  Multiple Vitamin (MULTIVITAMIN WITH MINERALS) TABS tablet Take 1 tablet by mouth daily.   Yes  [provider]  Oxycodone HCl 10 MG TABS Take 1 tablet (10 mg total) by mouth 3 (three) times daily as needed. Must last 30 days. 02/24/19 03/26/19 Yes Milinda Pointer, MD  pantoprazole (PROTONIX) 40 MG tablet Take 40 mg by mouth 2 (two) times daily.   Yes [provider]  predniSONE (DELTASONE) 20 MG tablet Take daily with food. Start with 60mg  (3 pills) x 2 days, then reduce to 40mg  (2 pills) x 2 days, then 20mg  (1 pill) x 3 days 02/11/19  Yes Karamalegos, Devonne Doughty, DO  pregabalin (LYRICA) 150 MG capsule Take 1 capsule (150 mg total) by mouth 3 (three) times daily. Must last 30 days 03/17/19 06/15/19 Yes Milinda Pointer, MD  QUEtiapine (SEROQUEL) 50 MG tablet Take 50 mg by mouth at bedtime.  11/22/18  Yes [provider]  timolol (BETIMOL) 0.25 % ophthalmic solution 1-2 drops 2 (two) times daily.   Yes [provider]  umeclidinium-vilanterol (ANORO ELLIPTA) 62.5-25 MCG/INH AEPB Inhale 1 puff into the lungs daily.   Yes [provider]     Allergies Azithromycin   Family History  Problem Relation Age of Onset  . Depression Mother   . Heart disease Father     Social History Social History   Tobacco Use  . Smoking status: Former Smoker    Years: 15.00  . Smokeless tobacco: Current User    Types: Chew  . Tobacco comment: stopped 15 years ago  Substance Use Topics  . Alcohol use: Yes    Comment: past  . Drug use: Never    Review of Systems  Constitutional:   No fever or chills.  ENT:   No sore throat. No rhinorrhea. Cardiovascular:   No chest pain or syncope. Respiratory:   Positive shortness of breath and nonproductive cough. Gastrointestinal:   Negative for abdominal pain, vomiting and diarrhea.  Musculoskeletal:   Positive left arm pain, not exertional. All other systems reviewed and are negative except as documented above in ROS and HPI.  ____________________________________________   PHYSICAL EXAM:  VITAL SIGNS: ED  Triage Vitals  Enc Vitals Group     BP 02/13/19 1746 (!) 183/93     Pulse Rate 02/13/19 1746 100     Resp 02/13/19 1746 (!) 24     Temp 02/13/19 1746 (!) 97.5 F (36.4 C)     Temp Source 02/13/19 1746 Oral     SpO2 02/13/19 1746 94 %     Weight 02/13/19 1739 259 lb (117.5 kg)     Height 02/13/19 1739 5\' 9"  (1.753 m)     Head Circumference --      Peak Flow --      Pain Score 02/13/19 1736 7     Pain Loc --      Pain Edu? --      Excl. in [place]? --     Vital signs reviewed, nursing assessments reviewed.   Constitutional:   Alert and oriented. Non-toxic appearance. Eyes:   Conjunctivae are normal. EOMI. PERRL. ENT      Head:   Normocephalic and atraumatic.      Nose:   No congestion/rhinnorhea.  Mouth/Throat:   MMM, no pharyngeal erythema. No peritonsillar mass.       Neck:   No meningismus. Full ROM. Hematological/Lymphatic/Immunilogical:   No cervical lymphadenopathy. Cardiovascular:   RRR, heart rate 95. Symmetric bilateral radial and DP pulses.  No murmurs. Cap refill less than 2 seconds. Respiratory:   Normal respiratory effort without tachypnea/retractions. Breath sounds are clear and equal bilaterally. No wheezes/rales/rhonchi. Gastrointestinal:   Soft and nontender. Non distended. There is no CVA tenderness.  No rebound, rigidity, or guarding.  Musculoskeletal:   Normal range of motion in all extremities. No joint effusions.  No lower extremity tenderness.  2+ bilateral pitting edema. Neurologic:   Normal speech and language.  Repeatedly asking for water Motor grossly intact. No acute focal neurologic deficits are appreciated.  Skin:    Skin is warm, dry and intact. No rash noted.  No petechiae, purpura, or bullae.  ____________________________________________    LABS (pertinent positives/negatives) (all labs ordered are listed, but only abnormal results are displayed) Labs Reviewed  BASIC METABOLIC PANEL - Abnormal; Notable for the following components:       Result Value   CO2 21 (*)    Glucose, Bld 251 (*)    All other components within normal limits  CBC WITH DIFFERENTIAL/PLATELET - Abnormal; Notable for the following components:   RDW 16.2 (*)    Lymphs Abs 0.5 (*)    All other components within normal limits  TROPONIN I (HIGH SENSITIVITY) - Abnormal; Notable for the following components:   Troponin I (High Sensitivity) 19 (*)    All other components within normal limits  SARS CORONAVIRUS 2 (HOSPITAL ORDER, [facility])  BRAIN NATRIURETIC PEPTIDE  ETHANOL  TROPONIN I (HIGH SENSITIVITY)   ____________________________________________   EKG  Interpreted by me Sinus rhythm rate of 96, normal axis and intervals.  Normal QRS ST segments and T waves.  1 PVC on the strip.  ____________________________________________    RADIOLOGY  Dg Chest Portable 1 View  Result Date: 02/13/2019 CLINICAL DATA:  Dyspnea, tachycardia right arm pain for 1 hour, former smoker EXAM: PORTABLE CHEST 1 VIEW COMPARISON:  Radiograph February 01, 2019, CT July 02, 2018 FINDINGS: Lung volumes are diminished. Streaky opacities in the lungs are favored to reflect atelectasis versus early infection. No pneumothorax or effusion. Cardiomediastinal contours are unremarkable. No acute osseous or soft tissue abnormality. Degenerative changes are present in the imaged spine and shoulders. IMPRESSION: Low volumes with streaky opacities in the bases favored to reflect atelectasis versus early infection. Electronically Signed   By: Lovena Le M.D.   On: 02/13/2019 18:04    ____________________________________________   PROCEDURES Procedures  ____________________________________________  DIFFERENTIAL DIAGNOSIS   COPD, COVID-19, pneumonia, pulmonary edema, pleural effusion, non-STEMI, musculoskeletal pain, dehydration  CLINICAL IMPRESSION / ASSESSMENT AND PLAN / ED COURSE  Medications ordered in the ED: Medications  pregabalin (LYRICA)  capsule 75 mg (has no administration in time range)  LORazepam (ATIVAN) injection 2 mg (has no administration in time range)  oxyCODONE (Oxy IR/ROXICODONE) immediate release tablet 10 mg (10 mg Oral Given 02/13/19 1839)    Pertinent labs & imaging results that were available during my care of the patient were reviewed by me and considered in my medical decision making (see chart for details).  Amedio Martines was evaluated in Emergency Department on 02/13/2019 for the symptoms described in the history of present illness. He was evaluated in the context of the global COVID-19 pandemic, which necessitated consideration that the patient  might be at risk for infection with the SARS-CoV-2 virus that causes COVID-19. Institutional protocols and algorithms that pertain to the evaluation of patients at risk for COVID-19 are in a state of rapid change based on information released by regulatory bodies including the CDC and federal and state organizations. These policies and algorithms were followed during the patient's care in the ED.     Clinical Course as of Feb 12 2102  Thu Feb 13, 2019  1741 Patient presents with cough shortness of breath weakness malaise diarrhea.  Borderline tachycardia with a heart rate of 100.  Check labs chest x-ray, repeat coronavirus test which has not been done in 6 weeks on this patient.     [PS]  2100 Patient having flushing and diaphoresis and restlessness which I think is due to alcohol withdrawal.  Will treat symptomatically.  In combination of the withdrawal symptoms, underlying depression that is high risk for self injury, acute heart failure symptoms, I discussed with the hospitalist for further evaluation and management.  COVID negative.   [PS]    Clinical Course User Index [PS] Carrie Mew, MD     ____________________________________________   FINAL CLINICAL IMPRESSION(S) / ED DIAGNOSES    Final diagnoses:  DOE (dyspnea on exertion)  Peripheral edema   Chest pain with moderate risk for cardiac etiology  Alcohol withdrawal syndrome without complication [facility])     ED Discharge Orders    None      Portions of this note were generated with dragon dictation software. Dictation errors may occur despite best attempts at proofreading.   Carrie Mew, MD 02/13/19 2103

## 2019-02-13 NOTE — ED Triage Notes (Signed)
Pt via EMS from home. Pt states he feels his heart is racing. Also c/o L arm pain that started about an hour ago.

## 2019-02-13 NOTE — Progress Notes (Signed)
Advanced Care Plan.  Purpose of Encounter: CODE STATUS. Parties in Attendance: The patient and me. Patient's Decisional Capacity: Yes. Medical Story: Aaron Gibbs  is a 84 y.o. male with a known history of hypertension, anxiety, skin cancer, COPD, osteoporosis and glaucoma.  The patient is being admitted for palpitation and epigastric pain, nausea vomiting and diarrhea.  I discussed with patient about his current condition, prognosis and CODE STATUS.  The patient does want to be resuscitated and intubated if he has cardiopulmonary arrest. Plan:  Code Status: Full code. Time spent discussing advance care planning: 17 minutes.

## 2019-02-14 ENCOUNTER — Observation Stay (HOSPITAL_COMMUNITY)
Admit: 2019-02-14 | Discharge: 2019-02-14 | Disposition: A | Discharge status: Home / Self Care | Payer: Medicare Other | Attending: Internal Medicine | Admitting: Internal Medicine

## 2019-02-14 ENCOUNTER — Other Ambulatory Visit: Payer: Self-pay

## 2019-02-14 DIAGNOSIS — K219 Gastro-esophageal reflux disease without esophagitis: Secondary | ICD-10-CM | POA: Diagnosis present

## 2019-02-14 DIAGNOSIS — M545 Low back pain: Secondary | ICD-10-CM | POA: Diagnosis present

## 2019-02-14 DIAGNOSIS — F1010 Alcohol abuse, uncomplicated: Secondary | ICD-10-CM | POA: Diagnosis present

## 2019-02-14 DIAGNOSIS — J432 Centrilobular emphysema: Secondary | ICD-10-CM | POA: Diagnosis present

## 2019-02-14 DIAGNOSIS — H3552 Pigmentary retinal dystrophy: Secondary | ICD-10-CM | POA: Diagnosis present

## 2019-02-14 DIAGNOSIS — M4810 Ankylosing hyperostosis [Forestier], site unspecified: Secondary | ICD-10-CM | POA: Diagnosis present

## 2019-02-14 DIAGNOSIS — M79602 Pain in left arm: Secondary | ICD-10-CM | POA: Diagnosis present

## 2019-02-14 DIAGNOSIS — H547 Unspecified visual loss: Secondary | ICD-10-CM | POA: Diagnosis present

## 2019-02-14 DIAGNOSIS — I10 Essential (primary) hypertension: Secondary | ICD-10-CM | POA: Diagnosis present

## 2019-02-14 DIAGNOSIS — R002 Palpitations: Secondary | ICD-10-CM | POA: Diagnosis present

## 2019-02-14 DIAGNOSIS — I739 Peripheral vascular disease, unspecified: Secondary | ICD-10-CM | POA: Diagnosis present

## 2019-02-14 DIAGNOSIS — R45851 Suicidal ideations: Secondary | ICD-10-CM

## 2019-02-14 DIAGNOSIS — Z20828 Contact with and (suspected) exposure to other viral communicable diseases: Secondary | ICD-10-CM | POA: Diagnosis present

## 2019-02-14 DIAGNOSIS — F332 Major depressive disorder, recurrent severe without psychotic features: Secondary | ICD-10-CM | POA: Diagnosis present

## 2019-02-14 DIAGNOSIS — G894 Chronic pain syndrome: Secondary | ICD-10-CM | POA: Diagnosis present

## 2019-02-14 DIAGNOSIS — R21 Rash and other nonspecific skin eruption: Secondary | ICD-10-CM | POA: Diagnosis present

## 2019-02-14 DIAGNOSIS — F418 Other specified anxiety disorders: Secondary | ICD-10-CM

## 2019-02-14 DIAGNOSIS — R739 Hyperglycemia, unspecified: Secondary | ICD-10-CM | POA: Diagnosis present

## 2019-02-14 DIAGNOSIS — G629 Polyneuropathy, unspecified: Secondary | ICD-10-CM | POA: Diagnosis present

## 2019-02-14 DIAGNOSIS — H409 Unspecified glaucoma: Secondary | ICD-10-CM | POA: Diagnosis present

## 2019-02-14 DIAGNOSIS — M810 Age-related osteoporosis without current pathological fracture: Secondary | ICD-10-CM | POA: Diagnosis present

## 2019-02-14 DIAGNOSIS — G4700 Insomnia, unspecified: Secondary | ICD-10-CM | POA: Diagnosis present

## 2019-02-14 DIAGNOSIS — F419 Anxiety disorder, unspecified: Secondary | ICD-10-CM | POA: Diagnosis present

## 2019-02-14 DIAGNOSIS — I5043 Acute on chronic combined systolic (congestive) and diastolic (congestive) heart failure: Secondary | ICD-10-CM

## 2019-02-14 DIAGNOSIS — K529 Noninfective gastroenteritis and colitis, unspecified: Secondary | ICD-10-CM | POA: Diagnosis present

## 2019-02-14 DIAGNOSIS — M150 Primary generalized (osteo)arthritis: Secondary | ICD-10-CM | POA: Diagnosis present

## 2019-02-14 LAB — CBC
HCT: 41.9 % (ref 39.0–52.0)
Hemoglobin: 13.7 g/dL (ref 13.0–17.0)
MCH: 30.1 pg (ref 26.0–34.0)
MCHC: 32.7 g/dL (ref 30.0–36.0)
MCV: 92.1 fL (ref 80.0–100.0)
Platelets: 134 10*3/uL — ABNORMAL LOW (ref 150–400)
RBC: 4.55 MIL/uL (ref 4.22–5.81)
RDW: 16.1 % — ABNORMAL HIGH (ref 11.5–15.5)
WBC: 6.1 10*3/uL (ref 4.0–10.5)
nRBC: 0 % (ref 0.0–0.2)

## 2019-02-14 LAB — ECHOCARDIOGRAM COMPLETE
Height: 69 in
Weight: 4131.2 oz

## 2019-02-14 LAB — GLUCOSE, CAPILLARY
Glucose-Capillary: 123 mg/dL — ABNORMAL HIGH (ref 70–99)
Glucose-Capillary: 109 mg/dL — ABNORMAL HIGH (ref 70–99)
Glucose-Capillary: 129 mg/dL — ABNORMAL HIGH (ref 70–99)
Glucose-Capillary: 135 mg/dL — ABNORMAL HIGH (ref 70–99)

## 2019-02-14 LAB — BASIC METABOLIC PANEL
Anion gap: 8 (ref 5–15)
BUN: 23 mg/dL (ref 8–23)
CO2: 26 mmol/L (ref 22–32)
Calcium: 9.1 mg/dL (ref 8.9–10.3)
Chloride: 107 mmol/L (ref 98–111)
Creatinine, Ser: 0.81 mg/dL (ref 0.61–1.24)
GFR calc Af Amer: >60 mL/min (ref >60)
GFR calc non Af Amer: >60 mL/min (ref >60)
Glucose, Bld: 105 mg/dL — ABNORMAL HIGH (ref 70–99)
Potassium: 4.2 mmol/L (ref 3.5–5.1)
Sodium: 141 mmol/L (ref 135–145)

## 2019-02-14 LAB — TROPONIN I (HIGH SENSITIVITY): Troponin I (High Sensitivity): 21 ng/L — ABNORMAL HIGH (ref <18)

## 2019-02-14 LAB — HEMOGLOBIN A1C
Hgb A1c MFr Bld: 5.9 % — ABNORMAL HIGH (ref 4.8–5.6)
Mean Plasma Glucose: 122.63 mg/dL

## 2019-02-14 MED ORDER — PERFLUTREN LIPID MICROSPHERE
1.0000 mL | INTRAVENOUS | Status: AC | PRN
  Administered 2019-02-14: 3 mL via INTRAVENOUS
  Filled 2019-02-14: qty 10

## 2019-02-14 MED ORDER — ALPRAZOLAM 0.25 MG PO TABS
0.2500 mg | ORAL_TABLET | Freq: Three times a day (TID) | ORAL | Status: DC | PRN
  Administered 2019-02-14: 0.25 mg via ORAL
  Filled 2019-02-14: qty 1

## 2019-02-14 MED ORDER — SODIUM CHLORIDE 0.9% FLUSH
3.0000 mL | Freq: Two times a day (BID) | INTRAVENOUS | Status: DC
  Administered 2019-02-14 – 2019-02-16 (×4): 3 mL via INTRAVENOUS

## 2019-02-14 MED ORDER — SODIUM CHLORIDE 0.9% FLUSH: 3.0000 mL | INTRAVENOUS | Status: DC | PRN

## 2019-02-14 MED ORDER — OXYCODONE HCL 5 MG PO TABS
10.0000 mg | ORAL_TABLET | Freq: Three times a day (TID) | ORAL | Status: DC | PRN
  Administered 2019-02-14 – 2019-02-18 (×9): 10 mg via ORAL
  Filled 2019-02-14 (×10): qty 2

## 2019-02-14 MED ORDER — EPINEPHRINE 1 MG/10ML IJ SOSY
PREFILLED_SYRINGE | INTRAMUSCULAR | Status: AC
  Filled 2019-02-14: qty 10

## 2019-02-14 MED ORDER — LORAZEPAM 2 MG/ML IJ SOLN
2.0000 mg | Freq: Once | INTRAMUSCULAR | Status: AC
  Administered 2019-02-14: 2 mg via INTRAVENOUS
  Filled 2019-02-14: qty 1

## 2019-02-14 MED ORDER — PNEUMOCOCCAL VAC POLYVALENT 25 MCG/0.5ML IJ INJ: 0.5000 mL | INJECTION | INTRAMUSCULAR | Status: DC
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
## 2019-02-14 NOTE — Progress Notes (Signed)
*  PRELIMINARY RESULTS* Echocardiogram 2D Echocardiogram has been performed.  Aaron Gibbs [name] 02/14/2019, 11:32 AM

## 2019-02-14 NOTE — Progress Notes (Addendum)
[place] [facility] [place] NAME: Aaron Gibbs    MR#:  [id number]  DATE OF BIRTH:  10/17/1934  SUBJECTIVE:  CHIEF COMPLAINT:   Chief Complaint  Patient presents with  . Palpitations  Patient seen and evaluated today Feels depressed Has suicidal thoughts Mild epigastric discomfort No chest pain Has some swelling in lower extremities  REVIEW OF SYSTEMS:    ROS  CONSTITUTIONAL: No documented fever. No fatigue, weakness. No weight gain, no weight loss.  EYES: No blurry or double vision.  ENT: No tinnitus. No postnasal drip. No redness of the oropharynx.  RESPIRATORY: No cough, no wheeze, no hemoptysis. No dyspnea.  CARDIOVASCULAR: No chest pain. No orthopnea. No palpitations. No syncope.  GASTROINTESTINAL: No nausea, no vomiting or diarrhea. No abdominal pain. No melena or hematochezia.  GENITOURINARY: No dysuria or hematuria.  ENDOCRINE: No polyuria or nocturia. No heat or cold intolerance.  HEMATOLOGY: No anemia. No bruising. No bleeding.  INTEGUMENTARY: No rashes. No lesions.  MUSCULOSKELETAL: No arthritis. Has swelling. No gout.  NEUROLOGIC: No numbness, tingling, or ataxia. No seizure-type activity.  PSYCHIATRIC: No anxiety. No insomnia.  Has depression  DRUG ALLERGIES:   Allergies  Allergen Reactions  . Azithromycin Shortness Of Breath    VITALS:  Blood pressure (!) 166/90, pulse 87, temperature 98.2 F (36.8 C), temperature source Oral, resp. rate 20, height 5\' 9"  (1.753 m), weight 117.1 kg, SpO2 98 %.  PHYSICAL EXAMINATION:   Physical Exam  GENERAL:  84 y.o.-year-old patient lying in the bed with no acute distress.  EYES: Pupils equal, round, reactive to light and accommodation. No scleral icterus. Extraocular muscles intact.  HEENT: Head atraumatic, normocephalic. Oropharynx and nasopharynx clear.  NECK:  Supple, no jugular venous distention. No thyroid enlargement, no tenderness.  LUNGS: Normal breath sounds  bilaterally, no wheezing, rales, rhonchi. No use of accessory muscles of respiration.  CARDIOVASCULAR: S1, S2 normal. No murmurs, rubs, or gallops.  ABDOMEN: Soft, mild tenderness in epigastrium, nondistended. Bowel sounds present. No organomegaly or mass.  EXTREMITIES: No cyanosis, clubbing  has edema b/l.    NEUROLOGIC: Cranial nerves II through XII are intact. No focal Motor or sensory deficits b/l.   PSYCHIATRIC: The patient is alert and oriented x 3.  SKIN: No obvious rash, lesion, or ulcer.   LABORATORY PANEL:   CBC Recent Labs  Lab 02/14/19 0021  WBC 6.1  HGB 13.7  HCT 41.9  PLT 134*   ------------------------------------------------------------------------------------------------------------------ Chemistries  Recent Labs  Lab 02/14/19 0021  NA 141  K 4.2  CL 107  CO2 26  GLUCOSE 105*  BUN 23  CREATININE 0.81  CALCIUM 9.1   ------------------------------------------------------------------------------------------------------------------  Cardiac Enzymes No results for input(s): TROPONINI in the last 168 hours. ------------------------------------------------------------------------------------------------------------------  RADIOLOGY:  Dg Chest Portable 1 View  Result Date: 02/13/2019 CLINICAL DATA:  Dyspnea, tachycardia right arm pain for 1 hour, former smoker EXAM: PORTABLE CHEST 1 VIEW COMPARISON:  Radiograph February 01, 2019, CT July 02, 2018 FINDINGS: Lung volumes are diminished. Streaky opacities in the lungs are favored to reflect atelectasis versus early infection. No pneumothorax or effusion. Cardiomediastinal contours are unremarkable. No acute osseous or soft tissue abnormality. Degenerative changes are present in the imaged spine and shoulders. IMPRESSION: Low volumes with streaky opacities in the bases favored to reflect atelectasis versus early infection. Electronically Signed   By: Lovena Le M.D.   On: 02/13/2019 18:04     ASSESSMENT AND  PLAN:   84 year old male patient with  history of hypertension, COPD, osteoporosis, glaucoma, skin cancer currently under hospitalist service  -Acute gastroenteritis IV fluids Antiemetics Supportive care  -COPD Stable Home dose inhalers  -Suicidal ideation One-on-one observation for patient safety Psychiatry consult  -Depression Psychiatry evaluation  -Palpitations Telemetry monitoring uneventful Troponins have been borderline Continue aspirin and fenofibric  -Nausea vomiting Antiemetics intravenously  -Lower extremity edema Unknown etiology Check echocardiogram to assess for heart failure and LV function  -DVT prophylaxis subcu Lovenox daily  All the records are reviewed and case discussed with Care Management/Social Worker. Management plans discussed with the patient, family and they are in agreement.  CODE STATUS: Full code  DVT Prophylaxis: SCDs  TOTAL TIME TAKING CARE OF THIS PATIENT: 37 minutes.   POSSIBLE D/C IN 2 to 3 DAYS, DEPENDING ON CLINICAL CONDITION.  Saundra Shelling M.D on 02/14/2019 at 10:07 AM  Between 7am to 6pm - Pager - [phone]  After 6pm go to [email or web address] - password [email or web address] [email or web address]  Office  [phone]  CC: Primary care physician; Olin Hauser, DO  Note: This dictation was prepared with Dragon dictation along with smaller phrase technology. Any transcriptional errors that result from this process are unintentional.

## 2019-02-14 NOTE — Progress Notes (Signed)
Spoke to the patients daughter at around 0900.  She says she spoke "at length" to the ER staff about her concerns regarding his depression and his suicide intent.  He has attempted suicide in the past.  She say that yesterday he said, "I don't want to bew here anymore".  Alerted Dr. Estanislado Pandy.  Patient told Dr. Estanislado Pandy he did have suicide thoughts.  Patient placed on suicide precautions.  A suicide trained sitter is at the bedside.

## 2019-02-15 LAB — GLUCOSE, CAPILLARY
Glucose-Capillary: 112 mg/dL — ABNORMAL HIGH (ref 70–99)
Glucose-Capillary: 129 mg/dL — ABNORMAL HIGH (ref 70–99)

## 2019-02-15 MED ORDER — LORAZEPAM 0.5 MG PO TABS
0.5000 mg | ORAL_TABLET | Freq: Three times a day (TID) | ORAL | Status: DC | PRN
  Administered 2019-02-15: 0.5 mg via ORAL
  Filled 2019-02-15: qty 1

## 2019-02-15 NOTE — Progress Notes (Addendum)
Patient's removed his IV by accident and he's refusing reinsertion. Patient stated he wants it done in the morning after his shower. Paged  Gardiner Barefoot NP via secured chart and waiting a response. No acute distress noted. Will continue to monitor.

## 2019-02-15 NOTE — Progress Notes (Signed)
[facility] NAME: Aaron Gibbs    MR#:  [id number]  DATE OF BIRTH:  Nov 25, 1934  SUBJECTIVE:  CHIEF COMPLAINT:   Chief Complaint  Patient presents with  . Palpitations  Patient seen and evaluated today Feels depressed Has suicidal thoughts Complains of generalized body pains. Sitter at bedside. REVIEW OF SYSTEMS:    ROS  CONSTITUTIONAL: No documented fever. No fatigue, weakness. No weight gain, no weight loss.  EYES: No blurry or double vision.  ENT: No tinnitus. No postnasal drip. No redness of the oropharynx.  RESPIRATORY: No cough, no wheeze, no hemoptysis. No dyspnea.  CARDIOVASCULAR: No chest pain. No orthopnea. No palpitations. No syncope.  GASTROINTESTINAL: No nausea, no vomiting or diarrhea. No abdominal pain. No melena or hematochezia.  GENITOURINARY: No dysuria or hematuria.  ENDOCRINE: No polyuria or nocturia. No heat or cold intolerance.  HEMATOLOGY: No anemia. No bruising. No bleeding.  INTEGUMENTARY: No rashes. No lesions.  MUSCULOSKELETAL: No arthritis. Has swelling. No gout.  NEUROLOGIC: No numbness, tingling, or ataxia. No seizure-type activity.  PSYCHIATRIC: No anxiety. No insomnia.  Has depression  DRUG ALLERGIES:   Allergies  Allergen Reactions  . Azithromycin Shortness Of Breath    VITALS:  Blood pressure (!) 148/75, pulse 71, temperature 97.8 F (36.6 C), temperature source Oral, resp. rate (!) 21, height 5\' 9"  (1.753 m), weight 117.1 kg, SpO2 95 %.  PHYSICAL EXAMINATION:   Physical Exam  GENERAL:  84 y.o.-year-old patient lying in the bed with no acute distress.  EYES: Pupils equal, round, reactive to light and accommodation. No scleral icterus. Extraocular muscles intact.  HEENT: Head atraumatic, normocephalic. Oropharynx and nasopharynx clear.  NECK:  Supple, no jugular venous distention. No thyroid enlargement, no tenderness.  LUNGS: Normal breath sounds bilaterally, no wheezing,  rales, rhonchi. No use of accessory muscles of respiration.  CARDIOVASCULAR: S1, S2 normal. No murmurs, rubs, or gallops.  ABDOMEN: Soft, mild tenderness in epigastrium, nondistended. Bowel sounds present. No organomegaly or mass.  EXTREMITIES: No cyanosis, clubbing  has edema b/l.    NEUROLOGIC: Cranial nerves II through XII are intact. No focal Motor or sensory deficits b/l.   PSYCHIATRIC: The patient is alert and oriented x 3.  SKIN: No obvious rash, lesion, or ulcer.   LABORATORY PANEL:   CBC Recent Labs  Lab 02/14/19 0021  WBC 6.1  HGB 13.7  HCT 41.9  PLT 134*   ------------------------------------------------------------------------------------------------------------------ Chemistries  Recent Labs  Lab 02/14/19 0021  NA 141  K 4.2  CL 107  CO2 26  GLUCOSE 105*  BUN 23  CREATININE 0.81  CALCIUM 9.1   ------------------------------------------------------------------------------------------------------------------  Cardiac Enzymes No results for input(s): TROPONINI in the last 168 hours. ------------------------------------------------------------------------------------------------------------------  RADIOLOGY:  Dg Chest Portable 1 View  Result Date: 02/13/2019 CLINICAL DATA:  Dyspnea, tachycardia right arm pain for 1 hour, former smoker EXAM: PORTABLE CHEST 1 VIEW COMPARISON:  Radiograph February 01, 2019, CT July 02, 2018 FINDINGS: Lung volumes are diminished. Streaky opacities in the lungs are favored to reflect atelectasis versus early infection. No pneumothorax or effusion. Cardiomediastinal contours are unremarkable. No acute osseous or soft tissue abnormality. Degenerative changes are present in the imaged spine and shoulders. IMPRESSION: Low volumes with streaky opacities in the bases favored to reflect atelectasis versus early infection. Electronically Signed   By: Lovena Le M.D.   On: 02/13/2019 18:04     ASSESSMENT AND PLAN:   84 year old male  patient with history of hypertension, COPD,  osteoporosis, glaucoma, skin cancer currently under hospitalist service  -Acute gastroenteritis Resolved.  -COPD Stable Home dose inhalers  -Suicidal ideation One-on-one observation for patient safety Seen by psychiatry nurse practitioner yesterday, recommends neuropsych admission, epic text messaged Waylan Boga.   -Depression Psychiatry evaluation  -Palpitations Telemetry monitoring uneventful Troponins have been borderline Continue aspirin and fenofibric Heart rate is around 86 bpm.   -Nausea vomiting Antiemetics intravenously  -Lower extremity edema Unknown etiology  echocardiogram shows EF more than 60% without any abnormality. -DVT prophylaxis subcu Lovenox daily Needs geropsychiatric admission  .  All the records are reviewed and case discussed with Care Management/Social Worker. Management plans discussed with the patient, family and they are in agreement.  CODE STATUS: Full code  DVT Prophylaxis: SCDs  TOTAL TIME TAKING CARE OF THIS PATIENT: 37 minutes.   POSSIBLE D/C IN 2 to 3 DAYS, DEPENDING ON CLINICAL CONDITION.  [name] M.D on 02/15/2019 at 10:53 AM  Between 7am to 6pm - Pager - [phone]  After 6pm go to [email or web address] - password [email or web address] [facility]  Office  [phone]  CC: Primary care physician; Olin Hauser, DO  Note: This dictation was prepared with Dragon dictation along with smaller phrase technology. Any transcriptional errors that result from this process are unintentional.
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
## 2019-02-16 MED ORDER — LORAZEPAM 0.5 MG PO TABS: 0.5000 mg | ORAL_TABLET | Freq: Two times a day (BID) | ORAL | Status: DC | PRN

## 2019-02-16 MED ORDER — CITALOPRAM HYDROBROMIDE 20 MG PO TABS
10.0000 mg | ORAL_TABLET | Freq: Every day | ORAL | Status: DC
  Administered 2019-02-16 – 2019-02-17 (×2): 10 mg via ORAL
  Filled 2019-02-16 (×2): qty 1

## 2019-02-16 MED ORDER — PREDNISOLONE ACETATE 1 % OP SUSP
2.0000 [drp] | Freq: Every day | OPHTHALMIC | Status: DC
  Administered 2019-02-16 – 2019-02-17 (×2): 2 [drp] via OPHTHALMIC
  Filled 2019-02-16: qty 1

## 2019-02-16 NOTE — Progress Notes (Addendum)
Ahmadou Talburt daughter was called and notified about the transfer to 1A rm 153. Will continue to monitor.

## 2019-02-16 NOTE — Progress Notes (Signed)
[facility] [place] NAME: Aaron Gibbs    MR#:  [id number]  DATE OF BIRTH:  [birth year]  SUBJECTIVE:  CHIEF COMPLAINT:   Chief Complaint  Patient presents with  . Palpitations  Patient is depressed, suicidal, spoke with psychiatrist yesterday, recommends geropsychiatry admission, did not recommend IVC. But  recommend sitter.  So sitter is at bedside.  Spoke with patient's daughter yesterday.  Patient says ""where I am going from here.   ROS  CONSTITUTIONAL: No documented fever. No fatigue, weakness. No weight gain, no weight loss.  EYES: No blurry or double vision.  ENT: No tinnitus. No postnasal drip. No redness of the oropharynx.  RESPIRATORY: No cough, no wheeze, no hemoptysis. No dyspnea.  CARDIOVASCULAR: No chest pain. No orthopnea. No palpitations. No syncope.  GASTROINTESTINAL: No nausea, no vomiting or diarrhea. No abdominal pain. No melena or hematochezia.  GENITOURINARY: No dysuria or hematuria.  ENDOCRINE: No polyuria or nocturia. No heat or cold intolerance.  HEMATOLOGY: No anemia. No bruising. No bleeding.  INTEGUMENTARY: No rashes. No lesions.  MUSCULOSKELETAL: No arthritis. Has swelling. No gout.  NEUROLOGIC: No numbness, tingling, or ataxia. No seizure-type activity.  PSYCHIATRIC: No anxiety. No insomnia.  Has depression  DRUG ALLERGIES:   Allergies  Allergen Reactions  . Azithromycin Shortness Of Breath    VITALS:  Blood pressure 123/64, pulse 66, temperature 98.6 F (37 C), resp. rate 18, height 5\' 9"  (1.753 m), weight 117.1 kg, SpO2 94 %.  PHYSICAL EXAMINATION:   Physical Exam  GENERAL:  84 y.o.-year-old patient lying in the bed with no acute distress.  EYES: Pupils equal, round, reactive to light and accommodation. No scleral icterus. Extraocular muscles intact.  HEENT: Head atraumatic, normocephalic. Oropharynx and nasopharynx clear.  NECK:  Supple, no jugular venous distention. No thyroid enlargement,  no tenderness.  LUNGS: Normal breath sounds bilaterally, no wheezing, rales, rhonchi. No use of accessory muscles of respiration.  CARDIOVASCULAR: S1, S2 normal. No murmurs, rubs, or gallops.  ABDOMEN: Soft, mild tenderness in epigastrium, nondistended. Bowel sounds present. No organomegaly or mass.  EXTREMITIES: No cyanosis, clubbing  has edema b/l.    NEUROLOGIC: Cranial nerves II through XII are intact. No focal Motor or sensory deficits b/l.   PSYCHIATRIC: The patient is alert and oriented x 3.  SKIN: No obvious rash, lesion, or ulcer.   LABORATORY PANEL:   CBC Recent Labs  Lab 02/14/19 0021  WBC 6.1  HGB 13.7  HCT 41.9  PLT 134*   ------------------------------------------------------------------------------------------------------------------ Chemistries  Recent Labs  Lab 02/14/19 0021  NA 141  K 4.2  CL 107  CO2 26  GLUCOSE 105*  BUN 23  CREATININE 0.81  CALCIUM 9.1   ------------------------------------------------------------------------------------------------------------------  Cardiac Enzymes No results for input(s): TROPONINI in the last 168 hours. ------------------------------------------------------------------------------------------------------------------  RADIOLOGY:  No results found.   ASSESSMENT AND PLAN:   84 year old male patient with history of hypertension, COPD, osteoporosis, glaucoma, skin cancer currently under hospitalist service  -Acute gastroenteritis Resolved.  -COPD Stable Home dose inhalers  -Suicidal ideation One-on-one observation for patient safety Seen by psychiatry nurse practitioner yesterday, recommends geropsychiatry admission, spoke with patient daughter yesterday.  -Depression Seen by psychiatry.  -Palpitations Telemetry monitoring uneventful Troponins have been borderline Continue aspirin and fenofibric Heart rate is around 86 bpm.   -Nausea vomiting Antiemetics intravenously  -Lower extremity  edema Unknown etiology  echocardiogram shows EF more than 60% without any abnormality. -DVT prophylaxis subcu Lovenox daily Needs geropsychiatric admission  Patient  has no diabetes mellitus, discontinued sliding scale insulin with coverage.  Discussed with patient's daughter.  .  All the records are reviewed and case discussed with Care Management/Social Worker. Management plans discussed with the patient, family and they are in agreement.  CODE STATUS: Full code  DVT Prophylaxis: SCDs  TOTAL TIME TAKING CARE OF THIS PATIENT: 37 minutes.   POSSIBLE D/C IN 2 to 3 DAYS, DEPENDING ON CLINICAL CONDITION.  [name] M.D on 02/16/2019 at 11:47 AM  Between 7am to 6pm - Pager - [phone]  After 6pm go to [email or web address] - password [email or web address] [facility]  Office  [phone]  CC: Primary care physician; Olin Hauser, DO  Note: This dictation was prepared with Dragon dictation along with smaller phrase technology. Any transcriptional errors that result from this process are unintentional.

## 2019-02-16 NOTE — Progress Notes (Signed)
Pts daughter took pts dirty clothes home today.

## 2019-02-16 NOTE — Progress Notes (Signed)
Gardiner Barefoot NP responded back that it's okay for patient not to have IV as long as he does not have IV medications this shift. No IV medications on this shift. Will continue to monitor.
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
## 2019-02-16 NOTE — Progress Notes (Signed)
Patient arrived to room 153. Had chewing tobacco in his pocket. Placed in patients bin

## 2019-02-17 MED ORDER — LORAZEPAM 0.5 MG PO TABS: 0.5000 mg | ORAL_TABLET | Freq: Every day | ORAL | Status: DC | PRN

## 2019-02-17 MED ORDER — CITALOPRAM HYDROBROMIDE 10 MG PO TABS
10.0000 mg | ORAL_TABLET | Freq: Every day | ORAL | Status: DC
  Filled 2019-02-17: qty 1

## 2019-02-17 MED ORDER — LORAZEPAM 0.5 MG PO TABS: 0.5000 mg | ORAL_TABLET | Freq: Every day | ORAL | 0 refills | Status: DC | PRN

## 2019-02-17 MED ORDER — CITALOPRAM HYDROBROMIDE 10 MG PO TABS: 10.0000 mg | ORAL_TABLET | Freq: Every day | ORAL | 0 refills | Status: DC

## 2019-02-17 MED ORDER — PREDNISOLONE ACETATE 1 % OP SUSP: 2.0000 [drp] | Freq: Every day | OPHTHALMIC | 0 refills | Status: DC

## 2019-02-17 NOTE — Progress Notes (Signed)
Pt has discharge orders placed by Dr. Vianne Bulls to be transferred to a geriatric psych unit. At this time pt is refusing transfer. I have discussed same with case management and she has been in discussion with psych for possible IVC for this pt. Will continue to assess the situation and discharge when pt is appropriate.

## 2019-02-17 NOTE — Progress Notes (Signed)
[place] [facility] [place] NAME: Aaron Gibbs    MR#:  [id number]  DATE OF BIRTH:  Nov 04, 1934  SUBJECTIVE:  CHIEF COMPLAINT:   Chief Complaint  Patient presents with  . Palpitations  Sitting in the chair, he says his left eye pain is better, takes prednisolone eyedrops.  Restarted them.  He is a better mood today.  No shortness of breath, chest pain.      CONSTITUTIONAL: No documented fever. No fatigue, weakness. No weight gain, no weight loss.  EYES: No blurry or double vision.  ENT: No tinnitus. No postnasal drip. No redness of the oropharynx.  RESPIRATORY: No cough, no wheeze, no hemoptysis. No dyspnea.  CARDIOVASCULAR: No chest pain. No orthopnea. No palpitations. No syncope.  GASTROINTESTINAL: No nausea, no vomiting or diarrhea. No abdominal pain. No melena or hematochezia.  GENITOURINARY: No dysuria or hematuria.  ENDOCRINE: No polyuria or nocturia. No heat or cold intolerance.  HEMATOLOGY: No anemia. No bruising. No bleeding.  INTEGUMENTARY: No rashes. No lesions.  MUSCULOSKELETAL: No arthritis. Has swelling. No gout.  NEUROLOGIC: No numbness, tingling, or ataxia. No seizure-type activity.  PSYCHIATRIC: No anxiety. No insomnia.  Has depression  DRUG ALLERGIES:   Allergies  Allergen Reactions  . Azithromycin Shortness Of Breath    VITALS:  Blood pressure (!) 116/57, pulse 69, temperature 97.7 F (36.5 C), temperature source Oral, resp. rate 18, height 5\' 9"  (1.753 m), weight 117.1 kg, SpO2 93 %.  PHYSICAL EXAMINATION:   Physical Exam  GENERAL:  84 y.o.-year-old patient lying in the bed with no acute distress.  EYES: Pupils equal, round, reactive to light and accommodation. No scleral icterus. Extraocular muscles intact.  HEENT: Head atraumatic, normocephalic. Oropharynx and nasopharynx clear.  NECK:  Supple, no jugular venous distention. No thyroid enlargement, no tenderness.  LUNGS: Normal breath sounds bilaterally, no  wheezing, rales, rhonchi. No use of accessory muscles of respiration.  CARDIOVASCULAR: S1, S2 normal. No murmurs, rubs, or gallops.  ABDOMEN: Soft, mild tenderness in epigastrium, nondistended. Bowel sounds present. No organomegaly or mass.  EXTREMITIES: No cyanosis, clubbing  has edema b/l.    NEUROLOGIC: Cranial nerves II through XII are intact. No focal Motor or sensory deficits b/l.   PSYCHIATRIC: The patient is alert and oriented x 3.  SKIN: No obvious rash, lesion, or ulcer.   LABORATORY PANEL:   CBC Recent Labs  Lab 02/14/19 0021  WBC 6.1  HGB 13.7  HCT 41.9  PLT 134*   ------------------------------------------------------------------------------------------------------------------ Chemistries  Recent Labs  Lab 02/14/19 0021  NA 141  K 4.2  CL 107  CO2 26  GLUCOSE 105*  BUN 23  CREATININE 0.81  CALCIUM 9.1   ------------------------------------------------------------------------------------------------------------------  Cardiac Enzymes No results for input(s): TROPONINI in the last 168 hours. ------------------------------------------------------------------------------------------------------------------  RADIOLOGY:  No results found.   ASSESSMENT AND PLAN:   84 year old male patient with history of hypertension, COPD, osteoporosis, glaucoma, skin cancer currently under hospitalist service  -Acute gastroenteritis Resolved.  -COPD Stable Home dose inhalers  -Suicidal ideation One-on-one observation for patient safety Seen by psychiatry nurse practitioner yesterday, recommends geropsychiatry admission,  -Depression Seen by psychiatry.,  Continue Seroquel,  -Palpitations Telemetry monitoring uneventful Troponins have been borderline Continue aspirin and fenofibric Palpitations resolved.   -Nausea vomiting Antiemetics intravenously  -Lower extremity edema Unknown etiology  echocardiogram shows EF more than 60% without any  abnormality. -DVT prophylaxis subcu Lovenox daily Needs geropsychiatric admission  Patient has no diabetes mellitus, discontinued sliding scale insulin with  coverage.  Discussed with patient's daughter. Waiting for [facility] psych admission. .  All the records are reviewed and case discussed with Care Management/Social Worker. Management plans discussed with the patient, family and they are in agreement.  CODE STATUS: Full code  DVT Prophylaxis: SCDs  TOTAL TIME TAKING CARE OF THIS PATIENT: 37 minutes.   POSSIBLE D/C IN 2 to 3 DAYS, DEPENDING ON CLINICAL CONDITION.  Aaron Gibbs M.D on 02/17/2019 at 12:02 PM  Between 7am to 6pm - Pager - [phone]  After 6pm go to [email or web address] - password [email or web address] [facility]  Office  [phone]  CC: Primary care physician; Aaron Hauser, DO  Note: This dictation was prepared with Dragon dictation along with smaller phrase technology. Any transcriptional errors that result from this process are unintentional.

## 2019-02-17 NOTE — Plan of Care (Signed)
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
## 2019-02-17 NOTE — Discharge Summary (Signed)
Aaron Gibbs, is a 84 y.o. male  DOB 02/06/1935  MRN [id number].  Admission date:  02/13/2019  Admitting Physician  Demetrios Loll, MD  Discharge Date:  02/17/2019   Primary MD  Olin Hauser, DO  Recommendations for primary care physician for things to follow:   Follow with PCP in 1 week Patient is going to [facility] for geropsychiatry, accepting physician Dr. Brantley Fling.  Admission Diagnosis  Peripheral edema [R60.9] DOE (dyspnea on exertion) [R06.09] Chest pain with moderate risk for cardiac etiology [R07.9] Alcohol withdrawal syndrome without complication ([place]) [id number]   Discharge Diagnosis  Peripheral edema [R60.9] DOE (dyspnea on exertion) [R06.09] Chest pain with moderate risk for cardiac etiology [R07.9] Alcohol withdrawal syndrome without complication ([place]) [id number]    Principal Problem:   Major depressive disorder, recurrent episode, severe ([place]) Active Problems:   Palpitation      Past Medical History:  Diagnosis Date  . Anxiety   . Cancer ([place])    skin  . COPD (chronic obstructive pulmonary disease) ([place])   . Glaucoma   . Osteoporosis    osteopenia    Past Surgical History:  Procedure Laterality Date  . CHOLECYSTECTOMY    . TRANSURETHRAL RESECTION OF PROSTATE N/A 2008       History of present illness and  Hospital Course:     Kindly see H&P for history of present illness and admission details, please review complete Labs, Consult reports and Test reports for all details in brief  HPI  from the history and physical done on the day of admission 84 year old male patient admitted because of shortness of breath, palpitations, and also found her depression.   Hospital Course  #1 .palpitations, patient is admitted to observation status, monitored on  telemetry.  Did not have any arrhythmias, patient troponins have been borderline, patient ejection fraction EF 60% without any abnormality.  Patient can continue aspirin, cholesterol medication.  Patient palpitations resolved. 2.  Acute gastroenteritis had stomach pain with nausea, vomiting.  Symptoms of acute gastroenteritis resolved. 3.  Shortness of breath secondary to COPD, no wheezing, continue home dose inhalers. 4.  Suicidal ideation, major depression, seen by psychiatry, patient will be on Seroquel 50 mg at bedtime, decreased to Ativan 0.5 as needed anxiety, continue Celexa 10 mg at night, continue Cymbalta 60 mg daily.  Psychiatry recommended geropsychiatry admission. #5 .left eye glaucoma, takes prednisolone eyedrops.   Discharge Condition: Stable   Follow UP  Follow-up Information    Olin Hauser, DO. Schedule an appointment as soon as possible for a visit in 1 week(s).   Specialty: Family Medicine Contact information: [place] [id number] [phone]             Discharge Instructions  and  Discharge Medications      Allergies as of 02/17/2019      Reactions   Azithromycin Shortness Of Breath      Medication List    STOP taking these medications   levofloxacin 500 MG tablet Commonly known as: LEVAQUIN   predniSONE 20 MG tablet Commonly known as: DELTASONE     TAKE these medications   albuterol 108 (90 Base) MCG/ACT inhaler Commonly known as: VENTOLIN HFA Inhale 1-2 puffs into the lungs every 6 (six) hours as needed for wheezing or shortness of breath.   amLODipine 10 MG tablet Commonly known as: NORVASC Take 10 mg by mouth daily.   Anoro Ellipta 62.5-25 MCG/INH Aepb Generic drug: umeclidinium-vilanterol Inhale 1  puff into the lungs daily.   aspirin EC 81 MG tablet Take 81 mg by mouth daily.   citalopram 10 MG tablet Commonly known as: CELEXA Take 1 tablet (10 mg total) by mouth at bedtime.   DULoxetine 60 MG  capsule Commonly known as: CYMBALTA Take 60 mg by mouth daily.   fenofibrate 145 MG tablet Commonly known as: TRICOR Take 145 mg by mouth daily.   ferrous sulfate 325 (65 FE) MG tablet Take 325 mg by mouth daily with breakfast.   LORazepam 0.5 MG tablet Commonly known as: ATIVAN Take 1 tablet (0.5 mg total) by mouth daily as needed for anxiety.   meloxicam 15 MG tablet Commonly known as: MOBIC Take 1 tablet (15 mg total) by mouth daily. Start taking on: March 17, 2019   multivitamin with minerals Tabs tablet Take 1 tablet by mouth daily.   Oxycodone HCl 10 MG Tabs Take 1 tablet (10 mg total) by mouth 3 (three) times daily as needed. Must last 30 days. Start taking on: February 24, 2019   pantoprazole 40 MG tablet Commonly known as: PROTONIX Take 40 mg by mouth 2 (two) times daily.   prednisoLONE acetate 1 % ophthalmic suspension Commonly known as: PRED FORTE Place 2 drops into the left eye daily. Start taking on: February 18, 2019   pregabalin 150 MG capsule Commonly known as: LYRICA Take 1 capsule (150 mg total) by mouth 3 (three) times daily. Must last 30 days Start taking on: March 17, 2019   QUEtiapine 50 MG tablet Commonly known as: SEROQUEL Take 50 mg by mouth at bedtime.   timolol 0.25 % ophthalmic solution Commonly known as: BETIMOL 1-2 drops 2 (two) times daily.         Diet and Activity recommendation: See Discharge Instructions above   Consults obtained -psychiatry   Major procedures and Radiology Reports - PLEASE review detailed and final reports for all details, in brief -    Dg Chest 2 View  Result Date: 02/01/2019 CLINICAL DATA:  Shortness of breath and weakness. EXAM: CHEST - 2 VIEW COMPARISON:  01/02/2019 and 07/30/2018 and chest CT 07/02/2018 FINDINGS: Patient is kyphotic. There is new blunting at the right costophrenic angle but there is no significant pleural fluid on the lateral view. Otherwise, the lungs are clear. Heart  and mediastinum are within normal limits. Bridging osteophytes in the thoracic spine. IMPRESSION: Blunting at the right costophrenic angle of uncertain etiology. Findings could be related to volume loss. No definite pleural fluid. Electronically Signed   By: Markus Daft M.D.   On: 02/01/2019 10:01   Dg Chest Portable 1 View  Result Date: 02/13/2019 CLINICAL DATA:  Dyspnea, tachycardia right arm pain for 1 hour, former smoker EXAM: PORTABLE CHEST 1 VIEW COMPARISON:  Radiograph February 01, 2019, CT July 02, 2018 FINDINGS: Lung volumes are diminished. Streaky opacities in the lungs are favored to reflect atelectasis versus early infection. No pneumothorax or effusion. Cardiomediastinal contours are unremarkable. No acute osseous or soft tissue abnormality. Degenerative changes are present in the imaged spine and shoulders. IMPRESSION: Low volumes with streaky opacities in the bases favored to reflect atelectasis versus early infection. Electronically Signed   By: Lovena Le M.D.   On: 02/13/2019 18:04    Micro Results    Recent Results (from the past 240 hour(s))  SARS Coronavirus 2 [facility] order, Performed in [facility] hospital lab) Nasopharyngeal Nasopharyngeal Swab     Status: None   Collection Time: 02/13/19  5:48 PM  Specimen: Nasopharyngeal Swab  Result Value Ref Range Status   SARS Coronavirus 2 NEGATIVE NEGATIVE Final    Comment: (NOTE) If result is NEGATIVE SARS-CoV-2 target nucleic acids are NOT DETECTED. The SARS-CoV-2 RNA is generally detectable in upper and lower  respiratory specimens during the acute phase of infection. The lowest  concentration of SARS-CoV-2 viral copies this assay can detect is 250  copies / mL. A negative result does not preclude SARS-CoV-2 infection  and should not be used as the sole basis for treatment or other  patient management decisions.  A negative result may occur with  improper specimen collection / handling, submission of specimen other   than nasopharyngeal swab, presence of viral mutation(s) within the  areas targeted by this assay, and inadequate number of viral copies  (<250 copies / mL). A negative result must be combined with clinical  observations, patient history, and epidemiological information. If result is POSITIVE SARS-CoV-2 target nucleic acids are DETECTED. The SARS-CoV-2 RNA is generally detectable in upper and lower  respiratory specimens dur ing the acute phase of infection.  Positive  results are indicative of active infection with SARS-CoV-2.  Clinical  correlation with patient history and other diagnostic information is  necessary to determine patient infection status.  Positive results do  not rule out bacterial infection or co-infection with other viruses. If result is PRESUMPTIVE POSTIVE SARS-CoV-2 nucleic acids MAY BE PRESENT.   A presumptive positive result was obtained on the submitted specimen  and confirmed on repeat testing.  While 2019 novel coronavirus  (SARS-CoV-2) nucleic acids may be present in the submitted sample  additional confirmatory testing may be necessary for epidemiological  and / or clinical management purposes  to differentiate between  SARS-CoV-2 and other Sarbecovirus currently known to infect humans.  If clinically indicated additional testing with an alternate test  methodology [phone]) is advised. The SARS-CoV-2 RNA is generally  detectable in upper and lower respiratory sp ecimens during the acute  phase of infection. The expected result is Negative. Fact Sheet for Patients:  StrictlyIdeas.no Fact Sheet for Healthcare Providers: BankingDealers.co.za This test is not yet approved or cleared by the [place] FDA and has been authorized for detection and/or diagnosis of SARS-CoV-2 by FDA under an Emergency Use Authorization (EUA).  This EUA will remain in effect (meaning this test can be used) for the duration of  the COVID-19 declaration under Section 564(b)(1) of the Act, 21 U.S.C. section 360bbb-3(b)(1), unless the authorization is terminated or revoked sooner. Performed at [facility], [address], [place], [place] [address]        Today   Subjective:   Aaron Gibbs stable for discharge  Objective:   Blood pressure (!) 116/57, pulse 69, temperature 97.7 F (36.5 C), temperature source Oral, resp. rate 18, height 5\' 9"  (1.753 m), weight 117.1 kg, SpO2 93 %.   Intake/Output Summary (Last 24 hours) at 02/17/2019 1324 Last data filed at 02/17/2019 0400 Gross per 24 hour  Intake 880 ml  Output 875 ml  Net 5 ml    Exam Awake Alert, Oriented x 3, No new F.N deficits, Normal affect Story.AT,PERRAL Supple Neck,No JVD, No cervical lymphadenopathy appriciated.  Symmetrical Chest wall movement, Good air movement bilaterally, CTAB RRR,No Gallops,Rubs or new Murmurs, No Parasternal Heave +ve B.Sounds, Abd Soft, Non tender, No organomegaly appriciated, No rebound -guarding or rigidity. No Cyanosis, Clubbing or edema, No new Rash or bruise  Data Review   CBC w Diff:  Lab Results  Component Value Date   WBC 6.1 02/14/2019   HGB 13.7 02/14/2019   HCT 41.9 02/14/2019   PLT 134 (L) 02/14/2019   LYMPHOPCT 9 02/13/2019   MONOPCT 3 02/13/2019   EOSPCT 0 02/13/2019   BASOPCT 1 02/13/2019    CMP:  Lab Results  Component Value Date   NA 141 02/14/2019   NA 140 07/10/2018   K 4.2 02/14/2019   CL 107 02/14/2019   CO2 26 02/14/2019   BUN 23 02/14/2019   BUN 25 07/10/2018   CREATININE 0.81 02/14/2019   GLU 122 08/15/2016   PROT 6.2 07/10/2018   ALBUMIN 4.2 07/10/2018   BILITOT 0.2 07/10/2018   ALKPHOS 65 07/10/2018   AST 17 07/10/2018   ALT 25 06/12/2018  .   Total Time in preparing paper work, data evaluation and todays exam - 58 minutes  [name] M.D on 02/17/2019 at 1:24 PM    Note: This dictation was prepared with Dragon dictation along with  smaller phrase technology. Any transcriptional errors that result from this process are unintentional.

## 2019-02-17 NOTE — Treatment Plan (Signed)
PT  PLACED  UNDER  IVC  PAPERS  PER  JAMISON  LORD  NP  INFORMED  NURSE [facility]   RN PAPERS  DONE  BY  [facility]  EMERGENCY  DEPT [facility]

## 2019-02-17 NOTE — TOC Progression Note (Signed)
Transition of Care [facility]) - Progression Note    Patient Details  Name: Aaron Gibbs MRN: [id number] Date of Birth: [birth year]  [facility] CM/SW [place], RN Phone Number: 02/17/2019, 9:40 AM  Clinical Narrative:     Faxed out referral to [facility] facilities in search of a bed.  Awaiting a call from a facility with a bed offer      Expected Discharge Plan and Services                                                 Social Determinants of Health (SDOH) Interventions    Readmission Risk Interventions No flowsheet data found.

## 2019-02-18 NOTE — Progress Notes (Signed)
Report called to [facility]. [facility] here to transport pt.

## 2019-02-18 NOTE — Progress Notes (Signed)
Patient is stable for transfer

## 2019-02-18 NOTE — TOC Progression Note (Signed)
Transition of Care [facility]) - Progression Note    Patient Details  Name: Aaron Gibbs MRN: [id number] Date of Birth: 1935/01/08  Transition of Care [facility]) CM/SW [place], RN Phone Number: 02/18/2019, 9:40 AM  Clinical Narrative:    Contacted  Dispatch and arranged for [facility] transport     Barriers to Discharge: Barriers Resolved  Expected Discharge Plan and Services           Expected Discharge Date: 02/17/19                                     Social Determinants of Health (SDOH) Interventions    Readmission Risk Interventions No flowsheet data found.

## 2019-02-18 NOTE — Discharge Summary (Signed)
Aaron Gibbs, is a 84 y.o. male  DOB 02-Sep-1934  MRN [id number].  Admission date:  02/13/2019  Admitting Physician  Demetrios Loll, MD  Discharge Date:  02/18/2019   Primary MD  Olin Hauser, DO  Recommendations for primary care physician for things to follow:   Follow with PCP in 1 week Patient is going to [facility] for geropsychiatry, accepting physician Dr. Brantley Fling.  Admission Diagnosis  Peripheral edema [R60.9] DOE (dyspnea on exertion) [R06.09] Chest pain with moderate risk for cardiac etiology [R07.9] Alcohol withdrawal syndrome without complication ([place]) [id number]   Discharge Diagnosis  Peripheral edema [R60.9] DOE (dyspnea on exertion) [R06.09] Chest pain with moderate risk for cardiac etiology [R07.9] Alcohol withdrawal syndrome without complication ([place]) [id number]    Principal Problem:   Major depressive disorder, recurrent episode, severe ([place]) Active Problems:   Palpitation      Past Medical History:  Diagnosis Date  . Anxiety   . Cancer ([place])    skin  . COPD (chronic obstructive pulmonary disease) ([place])   . Glaucoma   . Osteoporosis    osteopenia    Past Surgical History:  Procedure Laterality Date  . CHOLECYSTECTOMY    . TRANSURETHRAL RESECTION OF PROSTATE N/A 2008       History of present illness and  Hospital Course:     Kindly see H&P for history of present illness and admission details, please review complete Labs, Consult reports and Test reports for all details in brief  HPI  from the history and physical done on the day of admission 84 year old male patient admitted because of shortness of breath, palpitations, and also found her depression.   Hospital Course  #1 .palpitations, patient is admitted to observation status, monitored on  telemetry.  Did not have any arrhythmias, patient troponins have been borderline, patient ejection fraction EF 60% without any abnormality.  Patient can continue aspirin, cholesterol medication.  Patient palpitations resolved. 2.  Acute gastroenteritis had stomach pain with nausea, vomiting.  Symptoms of acute gastroenteritis resolved. 3.  Shortness of breath secondary to COPD, no wheezing, continue home dose inhalers. 4.  Suicidal ideation, major depression, seen by psychiatry, patient will be on Seroquel 50 mg at bedtime, decreased to Ativan 0.5 as needed anxiety, continue Celexa 10 mg at night, continue Cymbalta 60 mg daily.  Psychiatry recommended geropsychiatry admission.  Patient admitted I IVC.  #5 .left eye glaucoma, takes prednisolone eyedrops.   Discharge Condition: Stable   Follow UP  Follow-up Information    Olin Hauser, DO. Schedule an appointment as soon as possible for a visit on 02/25/2019.   Specialty: Family Medicine Why: Phone Visit;  @ 1:40 pm Contact information: [place] [address] [phone]             Discharge Instructions  and  Discharge Medications      Allergies as of 02/18/2019      Reactions   Azithromycin Shortness Of Breath      Medication List    STOP taking these medications   levofloxacin 500 MG tablet Commonly known as: LEVAQUIN   predniSONE 20 MG tablet Commonly known as: DELTASONE     TAKE these medications   albuterol 108 (90 Base) MCG/ACT inhaler Commonly known as: VENTOLIN HFA Inhale 1-2 puffs into the lungs every 6 (six) hours as needed for wheezing or shortness of breath.   amLODipine 10 MG tablet Commonly known as: NORVASC Take 10 mg by mouth daily.  Anoro Ellipta 62.5-25 MCG/INH Aepb Generic drug: umeclidinium-vilanterol Inhale 1 puff into the lungs daily.   aspirin EC 81 MG tablet Take 81 mg by mouth daily.   citalopram 10 MG tablet Commonly known as: CELEXA Take 1 tablet (10 mg total)  by mouth at bedtime.   DULoxetine 60 MG capsule Commonly known as: CYMBALTA Take 60 mg by mouth daily.   fenofibrate 145 MG tablet Commonly known as: TRICOR Take 145 mg by mouth daily.   ferrous sulfate 325 (65 FE) MG tablet Take 325 mg by mouth daily with breakfast.   LORazepam 0.5 MG tablet Commonly known as: ATIVAN Take 1 tablet (0.5 mg total) by mouth daily as needed for anxiety.   meloxicam 15 MG tablet Commonly known as: MOBIC Take 1 tablet (15 mg total) by mouth daily. Start taking on: March 17, 2019   multivitamin with minerals Tabs tablet Take 1 tablet by mouth daily.   Oxycodone HCl 10 MG Tabs Take 1 tablet (10 mg total) by mouth 3 (three) times daily as needed. Must last 30 days. Start taking on: February 24, 2019   pantoprazole 40 MG tablet Commonly known as: PROTONIX Take 40 mg by mouth 2 (two) times daily.   prednisoLONE acetate 1 % ophthalmic suspension Commonly known as: PRED FORTE Place 2 drops into the left eye daily.   pregabalin 150 MG capsule Commonly known as: LYRICA Take 1 capsule (150 mg total) by mouth 3 (three) times daily. Must last 30 days Start taking on: March 17, 2019   QUEtiapine 50 MG tablet Commonly known as: SEROQUEL Take 50 mg by mouth at bedtime.   timolol 0.25 % ophthalmic solution Commonly known as: BETIMOL 1-2 drops 2 (two) times daily.         Diet and Activity recommendation: See Discharge Instructions above   Consults obtained -psychiatry   Major procedures and Radiology Reports - PLEASE review detailed and final reports for all details, in brief -    Dg Chest 2 View  Result Date: 02/01/2019 CLINICAL DATA:  Shortness of breath and weakness. EXAM: CHEST - 2 VIEW COMPARISON:  01/02/2019 and 07/30/2018 and chest CT 07/02/2018 FINDINGS: Patient is kyphotic. There is new blunting at the right costophrenic angle but there is no significant pleural fluid on the lateral view. Otherwise, the lungs are clear.  Heart and mediastinum are within normal limits. Bridging osteophytes in the thoracic spine. IMPRESSION: Blunting at the right costophrenic angle of uncertain etiology. Findings could be related to volume loss. No definite pleural fluid. Electronically Signed   By: Markus Daft M.D.   On: 02/01/2019 10:01   Dg Chest Portable 1 View  Result Date: 02/13/2019 CLINICAL DATA:  Dyspnea, tachycardia right arm pain for 1 hour, former smoker EXAM: PORTABLE CHEST 1 VIEW COMPARISON:  Radiograph February 01, 2019, CT July 02, 2018 FINDINGS: Lung volumes are diminished. Streaky opacities in the lungs are favored to reflect atelectasis versus early infection. No pneumothorax or effusion. Cardiomediastinal contours are unremarkable. No acute osseous or soft tissue abnormality. Degenerative changes are present in the imaged spine and shoulders. IMPRESSION: Low volumes with streaky opacities in the bases favored to reflect atelectasis versus early infection. Electronically Signed   By: Lovena Le M.D.   On: 02/13/2019 18:04    Micro Results    Recent Results (from the past 240 hour(s))  SARS Coronavirus 2 [facility] order, Performed in [facility] hospital lab) Nasopharyngeal Nasopharyngeal Swab     Status: None   Collection Time:  02/13/19  5:48 PM   Specimen: Nasopharyngeal Swab  Result Value Ref Range Status   SARS Coronavirus 2 NEGATIVE NEGATIVE Final    Comment: (NOTE) If result is NEGATIVE SARS-CoV-2 target nucleic acids are NOT DETECTED. The SARS-CoV-2 RNA is generally detectable in upper and lower  respiratory specimens during the acute phase of infection. The lowest  concentration of SARS-CoV-2 viral copies this assay can detect is 250  copies / mL. A negative result does not preclude SARS-CoV-2 infection  and should not be used as the sole basis for treatment or other  patient management decisions.  A negative result may occur with  improper specimen collection / handling, submission of specimen  other  than nasopharyngeal swab, presence of viral mutation(s) within the  areas targeted by this assay, and inadequate number of viral copies  (<250 copies / mL). A negative result must be combined with clinical  observations, patient history, and epidemiological information. If result is POSITIVE SARS-CoV-2 target nucleic acids are DETECTED. The SARS-CoV-2 RNA is generally detectable in upper and lower  respiratory specimens dur ing the acute phase of infection.  Positive  results are indicative of active infection with SARS-CoV-2.  Clinical  correlation with patient history and other diagnostic information is  necessary to determine patient infection status.  Positive results do  not rule out bacterial infection or co-infection with other viruses. If result is PRESUMPTIVE POSTIVE SARS-CoV-2 nucleic acids MAY BE PRESENT.   A presumptive positive result was obtained on the submitted specimen  and confirmed on repeat testing.  While 2019 novel coronavirus  (SARS-CoV-2) nucleic acids may be present in the submitted sample  additional confirmatory testing may be necessary for epidemiological  and / or clinical management purposes  to differentiate between  SARS-CoV-2 and other Sarbecovirus currently known to infect humans.  If clinically indicated additional testing with an alternate test  methodology [phone]) is advised. The SARS-CoV-2 RNA is generally  detectable in upper and lower respiratory sp ecimens during the acute  phase of infection. The expected result is Negative. Fact Sheet for Patients:  StrictlyIdeas.no Fact Sheet for Healthcare Providers: BankingDealers.co.za This test is not yet approved or cleared by the [place] FDA and has been authorized for detection and/or diagnosis of SARS-CoV-2 by FDA under an Emergency Use Authorization (EUA).  This EUA will remain in effect (meaning this test can be used) for the duration  of the COVID-19 declaration under Section 564(b)(1) of the Act, 21 U.S.C. section 360bbb-3(b)(1), unless the authorization is terminated or revoked sooner. Performed at [facility], [address] [place], [place] [address]        Today   Subjective:   Aaron Gibbs stable for discharge  Objective:   Blood pressure (!) 159/77, pulse 94, temperature 98.9 F (37.2 C), temperature source Oral, resp. rate 18, height 5\' 9"  (1.753 m), weight 117.1 kg, SpO2 99 %.   Intake/Output Summary (Last 24 hours) at 02/18/2019 0934 Last data filed at 02/18/2019 0221 Gross per 24 hour  Intake 360 ml  Output -  Net 360 ml    Exam Awake Alert, Oriented x 3, No new F.N deficits, Normal affect [place].AT,PERRAL Supple Neck,No JVD, No cervical lymphadenopathy appriciated.  Symmetrical Chest wall movement, Good air movement bilaterally, CTAB RRR,No Gallops,Rubs or new Murmurs, No Parasternal Heave +ve B.Sounds, Abd Soft, Non tender, No organomegaly appriciated, No rebound -guarding or rigidity. No Cyanosis, Clubbing or edema, No new Rash or bruise  Data Review   CBC w  Diff:  Lab Results  Component Value Date   WBC 6.1 02/14/2019   HGB 13.7 02/14/2019   HCT 41.9 02/14/2019   PLT 134 (L) 02/14/2019   LYMPHOPCT 9 02/13/2019   MONOPCT 3 02/13/2019   EOSPCT 0 02/13/2019   BASOPCT 1 02/13/2019    CMP:  Lab Results  Component Value Date   NA 141 02/14/2019   NA 140 07/10/2018   K 4.2 02/14/2019   CL 107 02/14/2019   CO2 26 02/14/2019   BUN 23 02/14/2019   BUN 25 07/10/2018   CREATININE 0.81 02/14/2019   GLU 122 08/15/2016   PROT 6.2 07/10/2018   ALBUMIN 4.2 07/10/2018   BILITOT 0.2 07/10/2018   ALKPHOS 65 07/10/2018   AST 17 07/10/2018   ALT 25 06/12/2018  .   Total Time in preparing paper work, data evaluation and todays exam - 35 minutes  [name] M.D on 02/18/2019 at 9:34 AM    Note: This dictation was prepared with Dragon dictation along with  smaller phrase technology. Any transcriptional errors that result from this process are unintentional.

## 2019-02-18 NOTE — TOC Transition Note (Signed)
Transition of Care [facility]) - CM/SW Discharge Note   Patient Details  Name: Aaron Gibbs MRN: [id number] Date of Birth: [birth year]  [facility] CM/SW Contact:  Su Hilt, RN Phone Number: 02/18/2019, 9:01 AM   Clinical Narrative:    Patient to Transfer to [facility] today transported via [place] department due to IVC. Report to be called by Bedside nurse to [phone] DIscharge packet on the chart   Final next level of care: Psychiatric Hospital Barriers to Discharge: Barriers Resolved   Patient Goals and CMS Choice        Discharge Placement                       Discharge Plan and Services                                     Social Determinants of Health (SDOH) Interventions     Readmission Risk Interventions No flowsheet data found.

## 2019-02-20 ENCOUNTER — Telehealth: Payer: Self-pay

## 2019-02-20 NOTE — Telephone Encounter (Signed)
Called to completed TCM after hospital discharge, noted patient was transferred to psychiatric hospital. Unable to complete TCM

## 2019-02-25 ENCOUNTER — Telehealth: Payer: Self-pay | Admitting: Family Medicine

## 2019-02-25 ENCOUNTER — Ambulatory Visit: Payer: Medicare Other | Admitting: Family Medicine

## 2019-02-25 ENCOUNTER — Other Ambulatory Visit: Payer: Self-pay

## 2019-02-25 NOTE — Telephone Encounter (Signed)
Left message twice.

## 2019-02-25 NOTE — Telephone Encounter (Signed)
Stanton Kidney at  [facility] is request a call back  [phone] pt is in the hospital

## 2019-02-26 NOTE — Telephone Encounter (Signed)
Patient has appt schedule for hospital follow up next week.

## 2019-02-27 ENCOUNTER — Telehealth: Payer: Self-pay | Admitting: *Deleted

## 2019-02-27 ENCOUNTER — Encounter: Payer: Self-pay | Admitting: Pain Medicine

## 2019-02-27 NOTE — Telephone Encounter (Signed)
Attempted to call for pre appointment assessment. Message left. 

## 2019-03-02 NOTE — Progress Notes (Signed)
Pain Management Virtual Encounter Note - Virtual Visit via Telephone Telehealth (real-time audio visits between healthcare provider and patient).   Patient's Phone No. & Preferred Pharmacy:  [phone] (home); [phone] (mobile); (Preferred) [phone] No e-mail address on record  [place], [address] [place] [address] Phone: [phone] Fax: [phone]    Pre-screening note:  Our staff contacted Aaron Gibbs and offered him an "in person", "face-to-face" appointment versus a telephone encounter. He indicated preferring the telephone encounter, at this time.   Reason for Virtual Visit: COVID-19*  Social distancing based on CDC and AMA recommendations.   I contacted Aaron Gibbs on 03/03/2019 via telephone.      I clearly identified myself as Gaspar Cola, MD. I verified that I was speaking with the correct person using two identifiers (Name: Aaron Gibbs, and date of birth: 27-Oct-1934).  Advanced Informed Consent I sought verbal advanced consent from Aaron Gibbs for virtual visit interactions. I informed Aaron Gibbs of possible security and privacy concerns, risks, and limitations associated with providing "not-in-person" medical evaluation and management services. I also informed Aaron Gibbs of the availability of "in-person" appointments. Finally, I informed him that there would be a charge for the virtual visit and that he could be  personally, fully or partially, financially responsible for it. Aaron Gibbs expressed understanding and agreed to proceed.   Historic Elements   Aaron Gibbs is a 84 y.o. year old, male patient evaluated today after his last encounter by our practice on 02/27/2019. Aaron Gibbs  has a past medical history of Anxiety, Cancer ([place]), COPD (chronic obstructive pulmonary disease) ([place]), Glaucoma, and Osteoporosis. He also  has a past surgical history that includes Cholecystectomy and  Transurethral resection of prostate (N/A, 2008). Aaron Gibbs has a current medication list which includes the following prescription(s): albuterol, amlodipine, aspirin ec, duloxetine, fenofibrate, ferrous sulfate, lorazepam, meloxicam, multivitamin with minerals, oxycodone hcl, pantoprazole, prednisolone acetate, pregabalin, quetiapine, timolol, and umeclidinium-vilanterol. He  reports that he has quit smoking. He quit after 15.00 years of use. His smokeless tobacco use includes chew. He reports current alcohol use. He reports that he does not use drugs. Aaron Gibbs is allergic to azithromycin.   HPI  Today, he is being contacted for both, medication management and a post-procedure assessment.  The patient indicates doing well with the current medication regimen. No adverse reactions or side effects reported to the medications.  The patient still having pain in the knees and he would like to know what else can be done.  At this point, I have reviewed the MRIs of the knees and it would seem that the left knee has some meniscal tears that need to be evaluated by orthopedic surgery.  The last time that he was seen by the [facility] orthopedic department was on 07/24/2018.  He had the MRIs done on 08/05/2018.  We have now completed a series of 5 intra-articular Hyalgan knee injections which have provided him with some relief of the pain, but not very significant.  At this point, the next step from the interventional standpoint is to do a diagnostic bilateral genicular nerve block and if he gets good relief of the pain for the duration of local anesthetic, we will consider him relatively good candidate for radiofrequency ablation of the genicular nerves.  However, this will do nothing for the stability of the knee and therefore this part needs to be evaluated by orthopedics.  The plan was explained  to the patient in detail and he understood and agreed with that.  Post-Procedure Evaluation  Procedure:  Therapeutic bilateral IA Hyalgan knee injection #5, no fluoroscopy or IV sedation. Pre-procedure pain level:  6/10 Post-procedure: 6/10 No initial benefit, possibly due to rapid discharge after no sedation procedure, without enough time to allow full onset of block.  Sedation: None.  Effectiveness during initial hour after procedure(Ultra-Short Term Relief): 20 %   Local anesthetic used: Long-acting (4-6 hours) Effectiveness: Defined as any analgesic benefit obtained secondary to the administration of local anesthetics. This carries significant diagnostic value as to the etiological location, or anatomical origin, of the pain. Duration of benefit is expected to coincide with the duration of the local anesthetic used.  Effectiveness during initial 4-6 hours after procedure(Short-Term Relief): 20 %   Long-term benefit: Defined as any relief past the pharmacologic duration of the local anesthetics.  Effectiveness past the initial 6 hours after procedure(Long-Term Relief): 30 %   Current benefits: Defined as benefit that persist at this time.   Analgesia:  <50% better Function: Somewhat improved ROM: Somewhat improved  Pharmacotherapy Assessment  Analgesic: Oxycodone IR 10 mg, 1 tab PO q 8 hrs (30 mg/day of oxycodone) (has enough to last until 02/24/2019) MME/day: 45 mg/day.   Monitoring: Pharmacotherapy: No side-effects or adverse reactions reported. [place] PMP: PDMP reviewed during this encounter.       Compliance: No problems identified. Effectiveness: Clinically acceptable. Plan: Refer to "POC".  UDS:  Summary  Date Value Ref Range Status  07/10/2018 FINAL  Final    Comment:    ==================================================================== TOXASSURE COMP DRUG ANALYSIS,UR ==================================================================== Test                             Result       Flag       Units Drug Present and Declared for Prescription Verification   Morphine                        [id number]       EXPECTED   ng/mg creat   Normorphine                    221          EXPECTED   ng/mg creat    Potential sources of large amounts of morphine in the absence of    codeine include administration of morphine or use of heroin.    Normorphine is an expected metabolite of morphine.   Hydromorphone                  88           EXPECTED   ng/mg creat    Hydromorphone may be present as a metabolite of morphine;    concentrations of hydromorphone rarely exceed 5% of the morphine    concentration when this is the source of hydromorphone.   Oxycodone                      1094         EXPECTED   ng/mg creat   Oxymorphone                    1028         EXPECTED   ng/mg creat   Noroxycodone  3205         EXPECTED   ng/mg creat   Noroxymorphone                 323          EXPECTED   ng/mg creat    Sources of oxycodone are scheduled prescription medications.    Oxymorphone, noroxycodone, and noroxymorphone are expected    metabolites of oxycodone. Oxymorphone is also available as a    scheduled prescription medication.   Duloxetine                     PRESENT      EXPECTED   Quetiapine                     PRESENT      EXPECTED   Acetaminophen                  PRESENT      EXPECTED Drug Present not Declared for Prescription Verification   Chlorpheniramine               PRESENT      UNEXPECTED   Hydroxyzine                    PRESENT      UNEXPECTED Drug Absent but Declared for Prescription Verification   Pregabalin                     Not Detected UNEXPECTED   Salicylate                     Not Detected UNEXPECTED    Aspirin, as indicated in the declared medication list, is not    always detected even when used as directed. ==================================================================== Test                      Result    Flag   Units      Ref Range   Creatinine              94               mg/dL       >=20 ==================================================================== Declared Medications:  The flagging and interpretation on this report are based on the  following declared medications.  Unexpected results may arise from  inaccuracies in the declared medications.  **Note: The testing scope of this panel includes these medications:  Duloxetine  Morphine (Morphine Sulfate)  Oxycodone (Oxycodone Acetaminophen)  Pregabalin  Quetiapine  **Note: The testing scope of this panel does not include small to  moderate amounts of these reported medications:  Acetaminophen (Oxycodone Acetaminophen)  Aspirin  **Note: The testing scope of this panel does not include following  reported medications:  Amlodipine (Norvasc)  Doxycycline  Fenofibrate  Iron (Ferrous Sulfate)  Meloxicam  Multivitamin  Pantoprazole (Protonix)  Polyethylene Glycol  Prednisolone  Sennosides  Timolol  Umeclidinium (Anoro Ellipta)  Vilanterol (Anoro Ellipta) ==================================================================== For clinical consultation, please call [phone]. ====================================================================    Laboratory Chemistry Profile (12 mo)  Renal: 07/10/2018: BUN/Creatinine Ratio 28 02/14/2019: BUN 23; Creatinine, Ser 0.81  Lab Results  Component Value Date   GFRAA >60 02/14/2019   GFRNONAA >60 02/14/2019   Hepatic: 07/10/2018: Albumin 4.2 Lab Results  Component Value Date   AST 17 07/10/2018   ALT 25 06/12/2018   Other: 07/10/2018: 25-Hydroxy, Vitamin D 34; 25-Hydroxy,  Vitamin D-2 <1.0; 25-Hydroxy, Vitamin D-3 34; CRP 5; Sed Rate 20; Vitamin B-12 793 Note: Above Lab results reviewed.  Imaging  Last 90 days:  Dg Chest 2 View  Result Date: 02/01/2019 CLINICAL DATA:  Shortness of breath and weakness. EXAM: CHEST - 2 VIEW COMPARISON:  01/02/2019 and 07/30/2018 and chest CT 07/02/2018 FINDINGS: Patient is kyphotic. There is new blunting at the right  costophrenic angle but there is no significant pleural fluid on the lateral view. Otherwise, the lungs are clear. Heart and mediastinum are within normal limits. Bridging osteophytes in the thoracic spine. IMPRESSION: Blunting at the right costophrenic angle of uncertain etiology. Findings could be related to volume loss. No definite pleural fluid. Electronically Signed   By: Markus Daft M.D.   On: 02/01/2019 10:01   Dg Chest Portable 1 View  Result Date: 02/13/2019 CLINICAL DATA:  Dyspnea, tachycardia right arm pain for 1 hour, former smoker EXAM: PORTABLE CHEST 1 VIEW COMPARISON:  Radiograph February 01, 2019, CT July 02, 2018 FINDINGS: Lung volumes are diminished. Streaky opacities in the lungs are favored to reflect atelectasis versus early infection. No pneumothorax or effusion. Cardiomediastinal contours are unremarkable. No acute osseous or soft tissue abnormality. Degenerative changes are present in the imaged spine and shoulders. IMPRESSION: Low volumes with streaky opacities in the bases favored to reflect atelectasis versus early infection. Electronically Signed   By: Lovena Le M.D.   On: 02/13/2019 18:04   Dg Chest Portable 1 View  Result Date: 01/02/2019 CLINICAL DATA:  Shortness of breath and cough. EXAM: PORTABLE CHEST 1 VIEW COMPARISON:  July 30, 2018 FINDINGS: Cardiomediastinal silhouette is normal. Mediastinal contours appear intact. Mild peribronchial airspace consolidation versus atelectasis in the left lower lung field. Osseous structures are without acute abnormality. Soft tissues are grossly normal. IMPRESSION: Mild peribronchial airspace consolidation versus atelectasis in the left lower lung field. Electronically Signed   By: Fidela Salisbury M.D.   On: 01/02/2019 16:31    Assessment  The primary encounter diagnosis was Chronic pain syndrome. Diagnoses of Chronic knee pain (Primary Area of Pain) (Bilateral) (L>R), Chronic low back pain (Bilateral) w/o sciatica, DISH  (diffuse idiopathic skeletal hyperostosis), and Acute meniscal tear of knee, left, sequela were also pertinent to this visit.  Plan of Care  I have discontinued Aaron Gibbs "Bill"'s citalopram. I am also having him maintain his pantoprazole, amLODipine, umeclidinium-vilanterol, ferrous sulfate, fenofibrate, timolol, aspirin EC, multivitamin with minerals, DULoxetine, QUEtiapine, albuterol, pregabalin, meloxicam, LORazepam, prednisoLONE acetate, and Oxycodone HCl.  Pharmacotherapy (Medications Ordered): Meds ordered this encounter  Medications  . Oxycodone HCl 10 MG TABS    Sig: Take 1 tablet (10 mg total) by mouth 3 (three) times daily as needed. Must last 30 days.    Dispense:  90 tablet    Refill:  0    Chronic Pain: STOP Act (Not applicable) Fill 1 day early if closed on refill date. Do not fill until: 03/26/2019. To last until: 04/25/2019. Avoid benzodiazepines within 8 hours of opioids   Orders:  Orders Placed This Encounter  Procedures  . GENICULAR NERVE BLOCK    Indication(s):  Sub-acute knee pain    Standing Status:   Future    Standing Expiration Date:   04/02/2019    Scheduling Instructions:     Side: Bilateral     Sedation: With Sedation.     Timeframe: As soon as schedule allows    Order Specific Question:   Where will this procedure be performed?    Answer:  [facility]  . Ambulatory referral to Orthopedic Surgery    Referral Priority:   Routine    Referral Type:   Surgical    Referral Reason:   Specialty Services Required    Referred to Provider:   Renata Caprice    Requested Specialty:   Orthopedic Surgery    Number of Visits Requested:   1   Follow-up plan:   Return in about 7 weeks (around 04/23/2019) for Procedure (w/ sedation): (B) Genicular NB #1 + referral to Dr. Arvella Nigh for f/u on knee MRIs..      Interventional management options: Considering: Diagnostic bilateral genicular nerve block #1 Possible bilateral genicular nerve RFA    PRN Procedures: Palliative bilateral IA Hyalgan knee injections S3/N1  Palliative bilateral IA knee injections (w/ steroids) #1     Recent Visits Date Type Provider Dept  02/06/19 Procedure visit Milinda Pointer, MD [facility]  01/21/19 Procedure visit Milinda Pointer, MD [facility]  12/31/18 Procedure visit Milinda Pointer, MD [facility]  12/17/18 Procedure visit Milinda Pointer, MD [facility]  Showing recent visits within past 90 days and meeting all other requirements   Today's Visits Date Type Provider Dept  03/03/19 Office Visit Milinda Pointer, MD [facility]  Showing today's visits and meeting all other requirements   Future Appointments No visits were found meeting these conditions.  Showing future appointments within next 90 days and meeting all other requirements   I discussed the assessment and treatment plan with the patient. The patient was provided an opportunity to ask questions and all were answered. The patient agreed with the plan and demonstrated an understanding of the instructions.  Patient advised to call back or seek an in-person evaluation if the symptoms or condition worsens.  Total duration of non-face-to-face encounter: 20 minutes.  Note by: Gaspar Cola, MD Date: 03/03/2019; Time: 5:06 PM  Note: This dictation was prepared with Dragon dictation. Any transcriptional errors that may result from this process are unintentional.  Disclaimer:  * Given the special circumstances of the COVID-19 pandemic, the federal government has announced that the Office for Civil Rights (OCR) will exercise its enforcement discretion and will not impose penalties on physicians using telehealth in the event of noncompliance with regulatory requirements under the [place] and [place] (HIPAA) in connection with the good faith provision of telehealth during the XX123456  national public health emergency. ([place])

## 2019-03-03 ENCOUNTER — Other Ambulatory Visit: Payer: Self-pay

## 2019-03-03 ENCOUNTER — Ambulatory Visit: Payer: Medicare Other | Attending: Pain Medicine | Admitting: Pain Medicine

## 2019-03-03 DIAGNOSIS — M545 Low back pain: Secondary | ICD-10-CM

## 2019-03-03 DIAGNOSIS — M25561 Pain in right knee: Secondary | ICD-10-CM

## 2019-03-03 DIAGNOSIS — M4810 Ankylosing hyperostosis [Forestier], site unspecified: Secondary | ICD-10-CM | POA: Diagnosis not present

## 2019-03-03 DIAGNOSIS — G894 Chronic pain syndrome: Secondary | ICD-10-CM

## 2019-03-03 DIAGNOSIS — S83282S Other tear of lateral meniscus, current injury, left knee, sequela: Secondary | ICD-10-CM | POA: Insufficient documentation

## 2019-03-03 DIAGNOSIS — M25562 Pain in left knee: Secondary | ICD-10-CM

## 2019-03-03 DIAGNOSIS — G8929 Other chronic pain: Secondary | ICD-10-CM

## 2019-03-03 DIAGNOSIS — S83207S Unspecified tear of unspecified meniscus, current injury, left knee, sequela: Secondary | ICD-10-CM

## 2019-03-03 MED ORDER — OXYCODONE HCL 10 MG PO TABS: 10.0000 mg | ORAL_TABLET | Freq: Three times a day (TID) | ORAL | 0 refills | Status: DC | PRN

## 2019-03-03 NOTE — Patient Instructions (Signed)

## 2019-03-04 ENCOUNTER — Inpatient Hospital Stay: Payer: Medicare Other | Admitting: Family Medicine

## 2019-03-04 ENCOUNTER — Telehealth: Payer: Self-pay | Admitting: *Deleted

## 2019-03-13 ENCOUNTER — Ambulatory Visit
Admission: RE | Admit: 2019-03-13 | Discharge: 2019-03-13 | Disposition: A | Discharge status: Home / Self Care | Payer: Medicare Other | Source: Ambulatory Visit | Attending: Pain Medicine | Admitting: Pain Medicine

## 2019-03-13 ENCOUNTER — Ambulatory Visit (HOSPITAL_BASED_OUTPATIENT_CLINIC_OR_DEPARTMENT_OTHER): Payer: Medicare Other | Admitting: Pain Medicine

## 2019-03-13 ENCOUNTER — Other Ambulatory Visit: Payer: Self-pay

## 2019-03-13 ENCOUNTER — Encounter: Payer: Self-pay | Admitting: Pain Medicine

## 2019-03-13 VITALS — BP 115/69 | HR 62 | Temp 97.1°F | Resp 14 | Ht 70.0 in | Wt 240.0 lb

## 2019-03-13 DIAGNOSIS — M170 Bilateral primary osteoarthritis of knee: Secondary | ICD-10-CM

## 2019-03-13 DIAGNOSIS — G8929 Other chronic pain: Secondary | ICD-10-CM

## 2019-03-13 DIAGNOSIS — M25561 Pain in right knee: Secondary | ICD-10-CM

## 2019-03-13 DIAGNOSIS — M1711 Unilateral primary osteoarthritis, right knee: Secondary | ICD-10-CM

## 2019-03-13 DIAGNOSIS — M1712 Unilateral primary osteoarthritis, left knee: Secondary | ICD-10-CM | POA: Diagnosis present

## 2019-03-13 DIAGNOSIS — M25562 Pain in left knee: Secondary | ICD-10-CM | POA: Insufficient documentation

## 2019-03-13 MED ORDER — FENTANYL CITRATE (PF) 100 MCG/2ML IJ SOLN
25.0000 ug | INTRAMUSCULAR | Status: DC | PRN
  Administered 2019-03-13: 50 ug via INTRAVENOUS
  Filled 2019-03-13: qty 2

## 2019-03-13 MED ORDER — ROPIVACAINE HCL 2 MG/ML IJ SOLN
18.0000 mL | Freq: Once | INTRAMUSCULAR | Status: AC
  Administered 2019-03-13: 18 mL via PERINEURAL
  Filled 2019-03-13: qty 20

## 2019-03-13 MED ORDER — LACTATED RINGERS IV SOLN
1000.0000 mL | Freq: Once | INTRAVENOUS | Status: AC
  Administered 2019-03-13: 1000 mL via INTRAVENOUS

## 2019-03-13 MED ORDER — MIDAZOLAM HCL 5 MG/5ML IJ SOLN
1.0000 mg | INTRAMUSCULAR | Status: DC | PRN
  Administered 2019-03-13: 2 mg via INTRAVENOUS
  Filled 2019-03-13: qty 5

## 2019-03-13 MED ORDER — METHYLPREDNISOLONE ACETATE 80 MG/ML IJ SUSP
80.0000 mg | Freq: Once | INTRAMUSCULAR | Status: AC
  Administered 2019-03-13: 10:00:00 80 mg
  Filled 2019-03-13: qty 1

## 2019-03-13 MED ORDER — LIDOCAINE HCL 2 % IJ SOLN
20.0000 mL | Freq: Once | INTRAMUSCULAR | Status: AC
  Administered 2019-03-13: 400 mg
  Filled 2019-03-13: qty 40

## 2019-03-13 NOTE — Patient Instructions (Signed)

## 2019-03-13 NOTE — Progress Notes (Signed)
Safety precautions to be maintained throughout the outpatient stay will include: orient to surroundings, keep bed in low position, maintain call bell within reach at all times, provide assistance with transfer out of bed and ambulation.  

## 2019-03-13 NOTE — Progress Notes (Signed)
Patient's Name: Aaron Gibbs  MRN: [id number]  Referring Provider: [name] *  DOB: [birth year]  PCP: Olin Hauser, DO  DOS: 03/13/2019  Note by: Gaspar Cola, MD  Service setting: Ambulatory outpatient  Specialty: Interventional Pain Management  Patient type: Established  Location: [facility] (AMB) Pain Management Facility  Visit type: Interventional Procedure   Primary Reason for Visit: Interventional Pain Management Treatment. CC: Knee Pain (bilateral)  Procedure:          Anesthesia, Analgesia, Anxiolysis:  Type: Genicular Nerves Block (Superior-lateral, Superior-medial, and Inferior-medial Genicular Nerves) #1  CPT: [id number]      Primary Purpose: Diagnostic Region: Lateral, Anterior, and Medial aspects of the knee joint, above and below the knee joint proper. Level: Superior and inferior to the knee joint. Target Area: For Genicular Nerve block(s), the targets are: the superior-lateral genicular nerve, located in the lateral distal portion of the femoral shaft as it curves to form the lateral epicondyle, in the region of the distal femoral metaphysis; the superior-medial genicular nerve, located in the medial distal portion of the femoral shaft as it curves to form the medial epicondyle; and the inferior-medial genicular nerve, located in the medial, proximal portion of the tibial shaft, as it curves to form the medial epicondyle, in the region of the proximal tibial metaphysis. Approach: Anterior, percutaneous, ipsilateral approach. Laterality: Bilateral  Type: Moderate (Conscious) Sedation combined with Local Anesthesia Indication(s): Analgesia and Anxiety Route: Intravenous (IV) IV Access: Secured Sedation: Meaningful verbal contact was maintained at all times during the procedure  Local Anesthetic: Lidocaine 1-2%  Position: Modified Fowler's position with pillows under the targeted knee(s).   Indications: 1. Chronic knee pain (Primary Area of Pain)  (Bilateral) (L>R)   2. Osteoarthritis of knee (Bilateral)   3. Osteoarthritis of patellofemoral joint (Right)   4. Tricompartment osteoarthritis of knee (Left)    Pain Score: Pre-procedure: 7 /10 Post-procedure: 0-No pain(No pain, pt ambulated to wheelchair with little assitance)/10   Pre-op Assessment:  Mr. Heimer is a 84 y.o. (year old), male patient, seen today for interventional treatment. He  has a past surgical history that includes Cholecystectomy and Transurethral resection of prostate (N/A, 2008). Mr. Streitz has a current medication list which includes the following prescription(s): albuterol, amlodipine, aspirin ec, bupropion, duloxetine, fenofibrate, ferrous sulfate, lorazepam, meloxicam, multivitamin with minerals, oxycodone hcl, pantoprazole, prednisolone acetate, pregabalin, quetiapine, timolol, timolol, trazodone, and umeclidinium-vilanterol, and the following Facility-Administered Medications: fentanyl and midazolam. His primarily concern today is the Knee Pain (bilateral)  Initial Vital Signs:  Pulse/HCG Rate: 62ECG Heart Rate: 70 Temp: 98.7 F (37.1 C) Resp: 18 BP: (!) 104/59 SpO2: 94 %  BMI: Estimated body mass index is 34.44 kg/m as calculated from the following:   Height as of this encounter: 5\' 10"  (1.778 m).   Weight as of this encounter: 240 lb (108.9 kg).  Risk Assessment: Allergies: Reviewed. He is allergic to azithromycin.  Allergy Precautions: None required Coagulopathies: Reviewed. None identified.  Blood-thinner therapy: None at this time Active Infection(s): Reviewed. None identified. Mr. Banka is afebrile  Site Confirmation: Mr. Wickman was asked to confirm the procedure and laterality before marking the site Procedure checklist: Completed Consent: Before the procedure and under the influence of no sedative(s), amnesic(s), or anxiolytics, the patient was informed of the treatment options, risks and possible complications. To fulfill our  ethical and legal obligations, as recommended by the American Medical Association's Code of Ethics, I have informed the patient of my clinical impression; the nature and purpose  of the treatment or procedure; the risks, benefits, and possible complications of the intervention; the alternatives, including doing nothing; the risk(s) and benefit(s) of the alternative treatment(s) or procedure(s); and the risk(s) and benefit(s) of doing nothing. The patient was provided information about the general risks and possible complications associated with the procedure. These may include, but are not limited to: failure to achieve desired goals, infection, bleeding, organ or nerve damage, allergic reactions, paralysis, and death. In addition, the patient was informed of those risks and complications associated to the procedure, such as failure to decrease pain; infection; bleeding; organ or nerve damage with subsequent damage to sensory, motor, and/or autonomic systems, resulting in permanent pain, numbness, and/or weakness of one or several areas of the body; allergic reactions; (i.e.: anaphylactic reaction); and/or death. Furthermore, the patient was informed of those risks and complications associated with the medications. These include, but are not limited to: allergic reactions (i.e.: anaphylactic or anaphylactoid reaction(s)); adrenal axis suppression; blood sugar elevation that in diabetics may result in ketoacidosis or comma; water retention that in patients with history of congestive heart failure may result in shortness of breath, pulmonary edema, and decompensation with resultant heart failure; weight gain; swelling or edema; medication-induced neural toxicity; particulate matter embolism and blood vessel occlusion with resultant organ, and/or nervous system infarction; and/or aseptic necrosis of one or more joints. Finally, the patient was informed that Medicine is not an exact science; therefore, there is also  the possibility of unforeseen or unpredictable risks and/or possible complications that may result in a catastrophic outcome. The patient indicated having understood very clearly. We have given the patient no guarantees and we have made no promises. Enough time was given to the patient to ask questions, all of which were answered to the patient's satisfaction. Mr. Blend has indicated that he wanted to continue with the procedure. Attestation: I, the ordering provider, attest that I have discussed with the patient the benefits, risks, side-effects, alternatives, likelihood of achieving goals, and potential problems during recovery for the procedure that I have provided informed consent. Date   Time: 03/13/2019  9:43 AM  Pre-Procedure Preparation:  Monitoring: As per clinic protocol. Respiration, ETCO2, SpO2, BP, heart rate and rhythm monitor placed and checked for adequate function Safety Precautions: Patient was assessed for positional comfort and pressure points before starting the procedure. Time-out: I initiated and conducted the "Time-out" before starting the procedure, as per protocol. The patient was asked to participate by confirming the accuracy of the "Time Out" information. Verification of the correct person, site, and procedure were performed and confirmed by me, the nursing staff, and the patient. "Time-out" conducted as per Joint Commission's Universal Protocol (UP.01.01.01). Time: 1020  Description of Procedure:          Area Prepped: Entire knee area, from mid-thigh to mid-shin, lateral, anterior, and medial aspects. Prepping solution: DuraPrep (Iodine Povacrylex [0.7% available iodine] and Isopropyl Alcohol, 74% w/w) Safety Precautions: Aspiration looking for blood return was conducted prior to all injections. At no point did we inject any substances, as a needle was being advanced. No attempts were made at seeking any paresthesias. Safe injection practices and needle disposal  techniques used. Medications properly checked for expiration dates. SDV (single dose vial) medications used. Description of the Procedure: Protocol guidelines were followed. The patient was placed in position over the procedure table. The target area was identified and the area prepped in the usual manner. Skin & deeper tissues infiltrated with local anesthetic. Appropriate amount of time  allowed to pass for local anesthetics to take effect. The procedure needles were then advanced to the target area. Proper needle placement secured. Negative aspiration confirmed. Solution injected in intermittent fashion, asking for systemic symptoms every 0.5cc of injectate. The needles were then removed and the area cleansed, making sure to leave some of the prepping solution back to take advantage of its long term bactericidal properties.  Vitals:   03/13/19 1043 03/13/19 1047 03/13/19 1053 03/13/19 1103  BP: (!) 108/56 (!) 95/47 110/61 115/69  Pulse:      Resp: 18 18 16 14   Temp: (!) 96.6 F (35.9 C)  (!) 97 F (36.1 C) (!) 97.1 F (36.2 C)  SpO2: 96% 93% 99% 99%  Weight:      Height:        Start Time: 1020 hrs. End Time: 1030 hrs. Materials:  Needle(s) Type: Spinal Needle Gauge: 22G Length: 3.5-in Medication(s): Please see orders for medications and dosing details.  Imaging Guidance (Non-Spinal):          Type of Imaging Technique: Fluoroscopy Guidance (Non-Spinal) Indication(s): Assistance in needle guidance and placement for procedures requiring needle placement in or near specific anatomical locations not easily accessible without such assistance. Exposure Time: Please see nurses notes. Contrast: None used. Fluoroscopic Guidance: I was personally present during the use of fluoroscopy. "Tunnel Vision Technique" used to obtain the best possible view of the target area. Parallax error corrected before commencing the procedure. "Direction-depth-direction" technique used to introduce the needle  under continuous pulsed fluoroscopy. Once target was reached, antero-posterior, oblique, and lateral fluoroscopic projection used confirm needle placement in all planes. Images permanently stored in EMR. Interpretation: No contrast injected. I personally interpreted the imaging intraoperatively. Adequate needle placement confirmed in multiple planes. Permanent images saved into the patient's record.  Antibiotic Prophylaxis:   Anti-infectives (From admission, onward)   None     Indication(s): None identified  Post-operative Assessment:  Post-procedure Vital Signs:  Pulse/HCG Rate: 6264 Temp: (!) 97.1 F (36.2 C) Resp: 14 BP: 115/69 SpO2: 99 %  EBL: None  Complications: No immediate post-treatment complications observed by team, or reported by patient.  Note: The patient tolerated the entire procedure well. A repeat set of vitals were taken after the procedure and the patient was kept under observation following institutional policy, for this type of procedure. Post-procedural neurological assessment was performed, showing return to baseline, prior to discharge. The patient was provided with post-procedure discharge instructions, including a section on how to identify potential problems. Should any problems arise concerning this procedure, the patient was given instructions to immediately contact us, at any time, without hesitation. In any case, we plan to contact the patient by telephone for a follow-up status report regarding this interventional procedure.  Comments:  No additional relevant information.  Plan of Care  Orders:  Orders Placed This Encounter  Procedures   GENICULAR NERVE BLOCK    Scheduling Instructions:     Side(s): Bilateral Knee     Level(s): Superior-Lateral, Superior-Medial, and Inferior-Medial Genicular Nerves     Sedation: With Sedation.     Timeframe: Today    Order Specific Question:   Where will this procedure be performed?    Answer:   [facility]   DG PA[facility] C-ARM 1-60 MIN NO REPORT    Intraoperative interpretation by procedural physician at [place].    Standing Status:   Standing    Number of Occurrences:   1    Order Specific  Question:   Reason for exam:    Answer:   Assistance in needle guidance and placement for procedures requiring needle placement in or near specific anatomical locations not easily accessible without such assistance.   Chronic Opioid Analgesic:  Oxycodone IR 10 mg, 1 tab PO q 8 hrs (30 mg/day of oxycodone) (has enough to last until 02/24/2019) MME/day: 45 mg/day.   Medications ordered for procedure: Meds ordered this encounter  Medications   lidocaine (XYLOCAINE) 2 % (with pres) injection 400 mg   lactated ringers infusion 1,000 mL   midazolam (VERSED) 5 MG/5ML injection 1-2 mg    Make sure Flumazenil is available in the pyxis when using this medication. If oversedation occurs, administer 0.2 mg IV over 15 sec. If after 45 sec no response, administer 0.2 mg again over 1 min; may repeat at 1 min intervals; not to exceed 4 doses (1 mg)   fentaNYL (SUBLIMAZE) injection 25-50 mcg    Make sure Narcan is available in the pyxis when using this medication. In the event of respiratory depression (RR< 8/min): Titrate NARCAN (naloxone) in increments of 0.1 to 0.2 mg IV at 2-3 minute intervals, until desired degree of reversal.   ropivacaine (PF) 2 mg/mL (0.2%) (NAROPIN) injection 18 mL   methylPREDNISolone acetate (DEPO-MEDROL) injection 80 mg   Medications administered: We administered lidocaine, lactated ringers, midazolam, fentaNYL, ropivacaine (PF) 2 mg/mL (0.2%), and methylPREDNISolone acetate.  See the medical record for exact dosing, route, and time of administration.  Follow-up plan:   Return in about 2 weeks (around 03/27/2019) for (VV), (PP).       Interventional management options: Considering: Diagnostic bilateral genicular nerve block #1 Possible bilateral  genicular nerve RFA   PRN Procedures: Palliative bilateral IA Hyalgan knee injections S3/N1  Palliative bilateral IA knee injections (w/ steroids) #1      Recent Visits Date Type Provider Dept  03/03/19 Office Visit Milinda Pointer, MD [facility]  02/06/19 Procedure visit Milinda Pointer, MD [facility]  01/21/19 Procedure visit Milinda Pointer, MD [facility]  12/31/18 Procedure visit Milinda Pointer, MD [facility]  12/17/18 Procedure visit Milinda Pointer, MD [facility]  Showing recent visits within past 90 days and meeting all other requirements   Today's Visits Date Type Provider Dept  03/13/19 Procedure visit Milinda Pointer, MD [facility]  Showing today's visits and meeting all other requirements   Future Appointments Date Type Provider Dept  04/07/19 Appointment Milinda Pointer, MD [facility]  04/23/19 Appointment Milinda Pointer, MD [facility]  Showing future appointments within next 90 days and meeting all other requirements   Disposition: Discharge home  Discharge Date & Time: 03/13/2019; 1103 hrs.   Primary Care Physician: Olin Hauser, DO Location: [facility] Note by: Gaspar Cola, MD Date: 03/13/2019; Time: 11:26 AM  Disclaimer:  Medicine is not an Chief Strategy Officer. The only guarantee in medicine is that nothing is guaranteed. It is important to note that the decision to proceed with this intervention was based on the information collected from the patient. The Data and conclusions were drawn from the patient's questionnaire, the interview, and the physical examination. Because the information was provided in large part by the patient, it cannot be guaranteed that it has not been purposely or unconsciously manipulated. Every effort has been made to obtain as much relevant data as possible for this evaluation. It is  important to note that the conclusions that lead to  this procedure are derived in large part from the available data. Always take into account that the treatment will also be dependent on availability of resources and existing treatment guidelines, considered by other Pain Management Practitioners as being common knowledge and practice, at the time of the intervention. For Medico-Legal purposes, it is also important to point out that variation in procedural techniques and pharmacological choices are the acceptable norm. The indications, contraindications, technique, and results of the above procedure should only be interpreted and judged by a Board-Certified Interventional Pain Specialist with extensive familiarity and expertise in the same exact procedure and technique.

## 2019-03-14 ENCOUNTER — Telehealth: Payer: Self-pay

## 2019-03-14 NOTE — Telephone Encounter (Signed)
Post procedure phone call.  LM 

## 2019-03-28 ENCOUNTER — Other Ambulatory Visit: Payer: Self-pay

## 2019-03-28 ENCOUNTER — Telehealth: Payer: Self-pay

## 2019-03-28 ENCOUNTER — Inpatient Hospital Stay
Admission: EM | Admit: 2019-03-28 | Discharge: 2019-04-02 | DRG: 208 | Disposition: A | Discharge status: Home Health | Payer: Medicare Other | Attending: Internal Medicine | Admitting: Internal Medicine

## 2019-03-28 ENCOUNTER — Emergency Department: Payer: Medicare Other

## 2019-03-28 DIAGNOSIS — I5032 Chronic diastolic (congestive) heart failure: Secondary | ICD-10-CM | POA: Diagnosis present

## 2019-03-28 DIAGNOSIS — H409 Unspecified glaucoma: Secondary | ICD-10-CM | POA: Diagnosis present

## 2019-03-28 DIAGNOSIS — J9620 Acute and chronic respiratory failure, unspecified whether with hypoxia or hypercapnia: Secondary | ICD-10-CM

## 2019-03-28 DIAGNOSIS — I110 Hypertensive heart disease with heart failure: Secondary | ICD-10-CM | POA: Diagnosis present

## 2019-03-28 DIAGNOSIS — Z818 Family history of other mental and behavioral disorders: Secondary | ICD-10-CM

## 2019-03-28 DIAGNOSIS — Z79899 Other long term (current) drug therapy: Secondary | ICD-10-CM

## 2019-03-28 DIAGNOSIS — K5900 Constipation, unspecified: Secondary | ICD-10-CM | POA: Diagnosis not present

## 2019-03-28 DIAGNOSIS — K567 Ileus, unspecified: Secondary | ICD-10-CM

## 2019-03-28 DIAGNOSIS — J440 Chronic obstructive pulmonary disease with acute lower respiratory infection: Secondary | ICD-10-CM | POA: Diagnosis present

## 2019-03-28 DIAGNOSIS — Z20828 Contact with and (suspected) exposure to other viral communicable diseases: Secondary | ICD-10-CM | POA: Diagnosis present

## 2019-03-28 DIAGNOSIS — Z7982 Long term (current) use of aspirin: Secondary | ICD-10-CM

## 2019-03-28 DIAGNOSIS — Z8249 Family history of ischemic heart disease and other diseases of the circulatory system: Secondary | ICD-10-CM

## 2019-03-28 DIAGNOSIS — E440 Moderate protein-calorie malnutrition: Secondary | ICD-10-CM | POA: Diagnosis present

## 2019-03-28 DIAGNOSIS — J181 Lobar pneumonia, unspecified organism: Secondary | ICD-10-CM | POA: Diagnosis not present

## 2019-03-28 DIAGNOSIS — J9600 Acute respiratory failure, unspecified whether with hypoxia or hypercapnia: Secondary | ICD-10-CM

## 2019-03-28 DIAGNOSIS — Z881 Allergy status to other antibiotic agents status: Secondary | ICD-10-CM

## 2019-03-28 DIAGNOSIS — R0602 Shortness of breath: Secondary | ICD-10-CM | POA: Diagnosis not present

## 2019-03-28 DIAGNOSIS — M810 Age-related osteoporosis without current pathological fracture: Secondary | ICD-10-CM | POA: Diagnosis present

## 2019-03-28 DIAGNOSIS — G894 Chronic pain syndrome: Secondary | ICD-10-CM | POA: Diagnosis present

## 2019-03-28 DIAGNOSIS — J9622 Acute and chronic respiratory failure with hypercapnia: Secondary | ICD-10-CM | POA: Diagnosis present

## 2019-03-28 DIAGNOSIS — Z6834 Body mass index (BMI) 34.0-34.9, adult: Secondary | ICD-10-CM

## 2019-03-28 DIAGNOSIS — J189 Pneumonia, unspecified organism: Secondary | ICD-10-CM | POA: Diagnosis present

## 2019-03-28 DIAGNOSIS — Z791 Long term (current) use of non-steroidal anti-inflammatories (NSAID): Secondary | ICD-10-CM

## 2019-03-28 DIAGNOSIS — F1010 Alcohol abuse, uncomplicated: Secondary | ICD-10-CM | POA: Diagnosis present

## 2019-03-28 DIAGNOSIS — F419 Anxiety disorder, unspecified: Secondary | ICD-10-CM | POA: Diagnosis present

## 2019-03-28 DIAGNOSIS — J9621 Acute and chronic respiratory failure with hypoxia: Secondary | ICD-10-CM | POA: Diagnosis present

## 2019-03-28 DIAGNOSIS — Z85828 Personal history of other malignant neoplasm of skin: Secondary | ICD-10-CM

## 2019-03-28 DIAGNOSIS — J441 Chronic obstructive pulmonary disease with (acute) exacerbation: Secondary | ICD-10-CM | POA: Diagnosis present

## 2019-03-28 DIAGNOSIS — F17220 Nicotine dependence, chewing tobacco, uncomplicated: Secondary | ICD-10-CM | POA: Diagnosis present

## 2019-03-28 DIAGNOSIS — K219 Gastro-esophageal reflux disease without esophagitis: Secondary | ICD-10-CM | POA: Diagnosis present

## 2019-03-28 LAB — URINALYSIS, COMPLETE (UACMP) WITH MICROSCOPIC
Bacteria, UA: NONE SEEN
Bilirubin Urine: NEGATIVE
Glucose, UA: NEGATIVE mg/dL
Hgb urine dipstick: NEGATIVE
Ketones, ur: NEGATIVE mg/dL
Nitrite: NEGATIVE
Protein, ur: 30 mg/dL — AB
Specific Gravity, Urine: 1.025 (ref 1.005–1.030)
WBC, UA: >50 WBC/hpf — ABNORMAL HIGH (ref 0–5)
pH: 5 (ref 5.0–8.0)

## 2019-03-28 LAB — CBC WITH DIFFERENTIAL/PLATELET
Abs Immature Granulocytes: 0.04 10*3/uL (ref 0.00–0.07)
Basophils Absolute: 0 10*3/uL (ref 0.0–0.1)
Basophils Relative: 0 %
Eosinophils Absolute: 0.4 10*3/uL (ref 0.0–0.5)
Eosinophils Relative: 5 %
HCT: 41.7 % (ref 39.0–52.0)
Hemoglobin: 13.3 g/dL (ref 13.0–17.0)
Immature Granulocytes: 1 %
Lymphocytes Relative: 18 %
Lymphs Abs: 1.4 10*3/uL (ref 0.7–4.0)
MCH: 29.5 pg (ref 26.0–34.0)
MCHC: 31.9 g/dL (ref 30.0–36.0)
MCV: 92.5 fL (ref 80.0–100.0)
Monocytes Absolute: 1 10*3/uL (ref 0.1–1.0)
Monocytes Relative: 13 %
Neutro Abs: 4.8 10*3/uL (ref 1.7–7.7)
Neutrophils Relative %: 63 %
Platelets: 179 10*3/uL (ref 150–400)
RBC: 4.51 MIL/uL (ref 4.22–5.81)
RDW: 15.9 % — ABNORMAL HIGH (ref 11.5–15.5)
WBC: 7.7 10*3/uL (ref 4.0–10.5)
nRBC: 0 % (ref 0.0–0.2)

## 2019-03-28 LAB — COMPREHENSIVE METABOLIC PANEL
ALT: 45 U/L — ABNORMAL HIGH (ref 0–44)
AST: 52 U/L — ABNORMAL HIGH (ref 15–41)
Albumin: 3.4 g/dL — ABNORMAL LOW (ref 3.5–5.0)
Alkaline Phosphatase: 51 U/L (ref 38–126)
Anion gap: 12 (ref 5–15)
BUN: 33 mg/dL — ABNORMAL HIGH (ref 8–23)
CO2: 19 mmol/L — ABNORMAL LOW (ref 22–32)
Calcium: 9.3 mg/dL (ref 8.9–10.3)
Chloride: 109 mmol/L (ref 98–111)
Creatinine, Ser: 1.21 mg/dL (ref 0.61–1.24)
GFR calc Af Amer: >60 mL/min (ref >60)
GFR calc non Af Amer: 55 mL/min — ABNORMAL LOW (ref >60)
Glucose, Bld: 145 mg/dL — ABNORMAL HIGH (ref 70–99)
Potassium: 4.5 mmol/L (ref 3.5–5.1)
Sodium: 140 mmol/L (ref 135–145)
Total Bilirubin: 0.8 mg/dL (ref 0.3–1.2)
Total Protein: 7.4 g/dL (ref 6.5–8.1)

## 2019-03-28 LAB — TROPONIN I (HIGH SENSITIVITY): Troponin I (High Sensitivity): 11 ng/L (ref <18)

## 2019-03-28 LAB — LACTIC ACID, PLASMA: Lactic Acid, Venous: 1.9 mmol/L (ref 0.5–1.9)

## 2019-03-28 LAB — SARS CORONAVIRUS 2 BY RT PCR (HOSPITAL ORDER, PERFORMED IN ~~LOC~~ HOSPITAL LAB): SARS Coronavirus 2: NEGATIVE

## 2019-03-28 LAB — BRAIN NATRIURETIC PEPTIDE: B Natriuretic Peptide: 52 pg/mL (ref 0.0–100.0)

## 2019-03-28 MED ORDER — PROPOFOL 1000 MG/100ML IV EMUL
INTRAVENOUS | Status: AC
  Filled 2019-03-28: qty 100

## 2019-03-28 MED ORDER — SODIUM CHLORIDE 0.9 % IV SOLN
2.0000 g | Freq: Two times a day (BID) | INTRAVENOUS | Status: DC
  Filled 2019-03-28 (×3): qty 2

## 2019-03-28 MED ORDER — ETOMIDATE 2 MG/ML IV SOLN
30.0000 mg | Freq: Once | INTRAVENOUS | Status: AC
  Administered 2019-03-29: 30 mg via INTRAVENOUS

## 2019-03-28 MED ORDER — LORAZEPAM 2 MG/ML IJ SOLN
1.0000 mg | Freq: Once | INTRAMUSCULAR | Status: AC
  Administered 2019-03-28: 1 mg via INTRAVENOUS
  Filled 2019-03-28: qty 1

## 2019-03-28 MED ORDER — SUCCINYLCHOLINE CHLORIDE 20 MG/ML IJ SOLN
120.0000 mg | Freq: Once | INTRAMUSCULAR | Status: AC
  Administered 2019-03-29: 120 mg via INTRAVENOUS

## 2019-03-28 MED ORDER — PROPOFOL 1000 MG/100ML IV EMUL
5.0000 ug/kg/min | INTRAVENOUS | Status: DC
  Administered 2019-03-29: 5 ug/kg/min via INTRAVENOUS

## 2019-03-28 MED ORDER — VANCOMYCIN HCL IN DEXTROSE 1-5 GM/200ML-% IV SOLN
1000.0000 mg | Freq: Once | INTRAVENOUS | Status: DC
  Filled 2019-03-28: qty 200

## 2019-03-28 MED ORDER — VANCOMYCIN HCL 10 G IV SOLR
2000.0000 mg | Freq: Once | INTRAVENOUS | Status: AC
  Administered 2019-03-29: 2000 mg via INTRAVENOUS
  Filled 2019-03-28: qty 2000

## 2019-03-28 MED ORDER — SODIUM CHLORIDE 0.9 % IV SOLN
2.0000 g | Freq: Once | INTRAVENOUS | Status: AC
  Administered 2019-03-28: 2 g via INTRAVENOUS
  Filled 2019-03-28: qty 2

## 2019-03-28 NOTE — ED Notes (Signed)
Pt calmer, but remains overall agitated. Sitter remains at arm's length.

## 2019-03-28 NOTE — ED Notes (Signed)
Patient remains agitated on Bi-pap. Dr. Cherylann Banas at bedside. [facility] ED tech at bedside.

## 2019-03-28 NOTE — ED Notes (Addendum)
Pt agitated, md and safety sitter at bedside. Report given by MD to this RN.

## 2019-03-28 NOTE — Telephone Encounter (Signed)
The pt daughter called complaining that her father falling twice in the last couple of days, confusion, and sweats. She denies fever. I recommended the daughter to take the patient to the ER, because new onset symptoms of confusion and dizziness could require emergent care. She states that the symptoms are not new onset that he's been feeling like this and that  EMS came out yesterday and evaluated him and decided he didn't need to go to the hospital. She then informed me that her father was recently discharged from [facility] and suffer from alcoholism. She said that he have not had any alcohol in about 55mths. She doesn't want to take him the ER, because she feel like they will not do anything. Please advise

## 2019-03-28 NOTE — ED Notes (Signed)
Report given to April B. RN.

## 2019-03-28 NOTE — Progress Notes (Signed)
PHARMACY -  BRIEF ANTIBIOTIC NOTE   Pharmacy has received consult(s) for vancomycin/cefepime from an ED provider.  The patient's profile has been reviewed for ht/wt/allergies/indication/available labs.    One time order(s) placed for vancomycin 2g IV load + cefepime 2g IV x 1  Further antibiotics/pharmacy consults should be ordered by admitting physician if indicated.                       Thank you,  Tobie Lords, PharmD, BCPS Clinical Pharmacist 03/28/2019  11:46 PM

## 2019-03-28 NOTE — Telephone Encounter (Signed)
Patient should be evaluated, possibly with labs.  Please schedule office visit in next 2-4 days.  With me is okay since Dr. Raliegh Ip is out of office.

## 2019-03-28 NOTE — ED Notes (Signed)
Dr. Cherylann Banas at bedside. RT called for Bipap. Patient tried to get off the end of the stretcher. Dr. Cherylann Banas and Colletta [place] ED tech at bedside and repositioned the patient on the stretcher. Patient placed on NRB by Dr. Cherylann Banas. Pulse ox 99%.

## 2019-03-28 NOTE — ED Notes (Signed)
Patient is rude to staff, but is compliant with NRB.

## 2019-03-28 NOTE — ED Triage Notes (Signed)
Per EMS report, patient c/o shortness of breath while at home. Upon arrival, patient was sitting up on the stretcher with his legs over the edge. EMT states patient becomes short of breath and then has panic attacks. Patient is legally blind and lives in an apartment by himself. Patient has a history of COPD. Patient was 89% on RA upon arrival.

## 2019-03-28 NOTE — Telephone Encounter (Addendum)
I called and spoke with the patient daughter [name] and scheduled the patient for Monday at 10:20am. [name] is aware if her father symptoms worsen over the weekend that he needs to seek emergency care.

## 2019-03-28 NOTE — ED Provider Notes (Signed)
[facility] Emergency Department Provider Note ____________________________________________   First MD Initiated Contact with Patient 03/28/19 2136     (approximate)  I have reviewed the triage vital signs and the nursing notes.   HISTORY  Chief Complaint Shortness of Breath  Level 5 caveat: History of present illness limited due to respiratory distress  HPI Aaron Gibbs is a 84 y.o. male with PMH as noted below including COPD who presents with shortness of breath acutely worsened this evening, but but gradual onset over the last few days and associated with generalized weakness.  Per EMS, the patient's O2 saturation was in the high 80s on room air.  The patient is unable to provide any history.  Past Medical History:  Diagnosis Date  . Anxiety   . Cancer ([place])    skin  . COPD (chronic obstructive pulmonary disease) ([place])   . Glaucoma   . Osteoporosis    osteopenia    Patient Active Problem List   Diagnosis Date Noted  . Acute meniscal tear of knee, left, sequela 03/03/2019  . Palpitation 02/13/2019  . Preop testing 11/14/2018  . Noncompliance with medication treatment due to overuse of medication 10/23/2018  . Neurogenic pain 08/20/2018  . Atherosclerotic peripheral vascular disease ([place]) 07/24/2018  . Tricompartment osteoarthritis of knee (Left) 07/24/2018  . Osteoarthritis of knee (Bilateral) 07/24/2018  . Osteoarthritis of patellofemoral joint (Right) 07/24/2018  . History of suicide attempt (06/12/18) 07/24/2018  . Long term current use of opiate analgesic 07/10/2018  . Pharmacologic therapy 07/10/2018  . Disorder of skeletal system 07/10/2018  . Problems influencing health status 07/10/2018  . Suicide attempt ([place]) (05/13/18) 06/26/2018  . Syncope and collapse 06/04/2018  . 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 Major depressive disorder, recurrent episode, severe ([place]) 06/04/2018  . Blindness 05/10/2018  . Suicidal ideation  05/10/2018  . Chronic pain syndrome 05/01/2018  . DISH (diffuse idiopathic skeletal hyperostosis) 05/01/2018  . Osteoarthritis of multiple joints 05/01/2018  . Chronic knee pain (Primary Area of Pain) (Bilateral) (L>R) 05/01/2018  . Retinitis pigmentosa of both eyes 05/01/2018  . Glaucoma of both eyes 05/01/2018  . Essential hypertension 05/01/2018  . Chronic low back pain (Bilateral) w/o sciatica 05/01/2018  . GERD (gastroesophageal reflux disease) 05/01/2018  . 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 Therapeutic opioid-induced constipation (OIC) 05/01/2018  . Centrilobular emphysema ([place]) 05/01/2018    Past Surgical History:  Procedure Laterality Date  . CHOLECYSTECTOMY    . TRANSURETHRAL RESECTION OF PROSTATE N/A 2008    Prior to Admission medications   Medication Sig Start Date End Date Taking? Authorizing Provider  albuterol (VENTOLIN HFA) 108 (90 Base) MCG/ACT inhaler Inhale 1-2 puffs into the lungs every 6 (six) hours as needed for wheezing or shortness of breath. 02/01/19   Norval Gable, MD  amLODipine (NORVASC) 10 MG tablet Take 10 mg by mouth daily.    [provider]  aspirin EC 81 MG tablet Take 81 mg by mouth daily.    [provider]  buPROPion [facility] SR) 150 MG 12 hr tablet  02/26/19   [provider]  DULoxetine (CYMBALTA) 60 MG capsule Take 60 mg by mouth daily.  11/26/18   [provider]  fenofibrate (TRICOR) 145 MG tablet Take 145 mg by mouth daily.    [provider]  ferrous sulfate 325 (65 FE) MG tablet Take 325 mg by mouth daily with breakfast.    [provider]  LORazepam (ATIVAN) 0.5 MG tablet Take  1 tablet (0.5 mg total) by mouth daily as needed for anxiety. 02/17/19   [name], MD  meloxicam (MOBIC) 15 MG tablet Take 1 tablet (15 mg total) by mouth daily. 03/17/19 06/15/19  Milinda Pointer, MD  Multiple Vitamin (MULTIVITAMIN WITH MINERALS) TABS tablet Take 1 tablet by  mouth daily.    [provider]  Oxycodone HCl 10 MG TABS Take 1 tablet (10 mg total) by mouth 3 (three) times daily as needed. Must last 30 days. 03/26/19 04/25/19  Milinda Pointer, MD  pantoprazole (PROTONIX) 40 MG tablet Take 40 mg by mouth daily.     [provider]  prednisoLONE acetate (PRED FORTE) 1 % ophthalmic suspension Place 2 drops into the left eye daily. 02/18/19   [name], MD  pregabalin (LYRICA) 150 MG capsule Take 1 capsule (150 mg total) by mouth 3 (three) times daily. Must last 30 days 03/17/19 06/15/19  Milinda Pointer, MD  QUEtiapine (SEROQUEL) 50 MG tablet Take 50 mg by mouth at bedtime.  11/22/18   [provider]  timolol (BETIMOL) 0.25 % ophthalmic solution 1-2 drops 2 (two) times daily.    [provider]  timolol (TIMOPTIC) 0.5 % ophthalmic solution  03/12/19   [provider]  traZODone (DESYREL) 50 MG tablet  02/26/19   [provider]  umeclidinium-vilanterol (ANORO ELLIPTA) 62.5-25 MCG/INH AEPB Inhale 1 puff into the lungs daily.    [provider]    Allergies Azithromycin  Family History  Problem Relation Age of Onset  . Depression Mother   . Heart disease Father     Social History Social History   Tobacco Use  . Smoking status: Former Smoker    Years: 15.00  . Smokeless tobacco: Current User    Types: Chew  . Tobacco comment: stopped 15 years ago  Substance Use Topics  . Alcohol use: Yes    Comment: past  . Drug use: Never    Review of Systems Level 5 caveat: Unable to obtain review of systems due to respiratory distress    ____________________________________________   PHYSICAL EXAM:  VITAL SIGNS: ED Triage Vitals  Enc Vitals Group     BP 03/28/19 2128 (!) 153/79     Pulse Rate 03/28/19 2128 100     Resp 03/28/19 2128 (!) 28     Temp 03/28/19 2128 98.1 F (36.7 C)     Temp Source 03/28/19 2128 Oral     SpO2 03/28/19 2127 93 %     Weight 03/28/19 2129 238  lb 1.6 oz (108 kg)     Height 03/28/19 2129 5\' 10"  (1.778 m)     Head Circumference --      Peak Flow --      Pain Score --      Pain Loc --      Pain Edu? --      Excl. in [place]? --     Constitutional: Alert, anxious appearing with psychomotor agitation.  Diaphoretic. Eyes: Conjunctivae are normal.  Head: Atraumatic. Nose: No congestion/rhinnorhea. Mouth/Throat: Mucous membranes are somewhat dry.   Neck: Normal range of motion.  Cardiovascular: Tachycardic, regular rhythm. Grossly normal heart sounds.  Good peripheral circulation. Respiratory: Normal respiratory effort.  No retractions.  Rales to bilateral bases worse on the left. Gastrointestinal: Soft and nontender. No distention.  Genitourinary: No flank tenderness. Musculoskeletal: No lower extremity edema.  Extremities warm and well perfused.  Neurologic: Normal speech.  Motor intact in all extremities. Skin:  Skin is warm and  moist. No rash noted. Psychiatric: Anxious appearing.  ____________________________________________   LABS (all labs ordered are listed, but only abnormal results are displayed)  Labs Reviewed  COMPREHENSIVE METABOLIC PANEL - Abnormal; Notable for the following components:      Result Value   CO2 19 (*)    Glucose, Bld 145 (*)    BUN 33 (*)    Albumin 3.4 (*)    AST 52 (*)    ALT 45 (*)    GFR calc non Af Amer 55 (*)    All other components within normal limits  CBC WITH DIFFERENTIAL/PLATELET - Abnormal; Notable for the following components:   RDW 15.9 (*)    All other components within normal limits  URINALYSIS, COMPLETE (UACMP) WITH MICROSCOPIC - Abnormal; Notable for the following components:   Color, Urine YELLOW (*)    APPearance HAZY (*)    Protein, ur 30 (*)    Leukocytes,Ua MODERATE (*)    WBC, UA >50 (*)    All other components within normal limits  SARS CORONAVIRUS 2 BY RT PCR (HOSPITAL ORDER, [place] [facility])  BRAIN NATRIURETIC PEPTIDE  LACTIC ACID, PLASMA   BLOOD GAS, ARTERIAL  TROPONIN I (HIGH SENSITIVITY)   ____________________________________________  EKG  ED ECG REPORT I, Arta Silence, the attending physician, personally viewed and interpreted this ECG.  Date: 03/28/2019 EKG Time: 2125 Rate: 111 Rhythm: Sinus tachycardia QRS Axis: normal Intervals: normal ST/T Wave abnormalities: normal Narrative Interpretation: no evidence of acute ischemia  ____________________________________________  RADIOLOGY  CXR: Left midlung consolidation suspicious for pneumonia ____________________________________________   PROCEDURES  Procedure(s) performed: Yes  Procedure Name: Intubation Date/Time: 03/29/2019 12:33 AM Performed by: Arta Silence, MD Pre-anesthesia Checklist: Patient identified, Patient being monitored, Emergency Drugs available, Timeout performed and Suction available Oxygen Delivery Method: Non-rebreather mask Preoxygenation: Pre-oxygenation with 100% oxygen Induction Type: Rapid sequence Laryngoscope Size: Glidescope and 4 Number of attempts: 1 Placement Confirmation: ETT inserted through vocal cords under direct vision,  CO2 detector and Breath sounds checked- equal and bilateral Secured at: 23 cm Tube secured with: ETT holder       Critical Care performed: Yes  CRITICAL CARE Performed by: Arta Silence   Total critical care time: 100 minutes  Critical care time was exclusive of separately billable procedures and treating other patients.  Critical care was necessary to treat or prevent imminent or life-threatening deterioration.  Critical care was time spent personally by me on the following activities: development of treatment plan with patient and/or surrogate as well as nursing, discussions with consultants, evaluation of patient's response to treatment, examination of patient, obtaining history from patient or surrogate, ordering and performing treatments and interventions, ordering  and review of laboratory studies, ordering and review of radiographic studies, pulse oximetry and re-evaluation of patient's condition. ____________________________________________   INITIAL IMPRESSION / ASSESSMENT AND PLAN / ED COURSE  Pertinent labs & imaging results that were available during my care of the patient were reviewed by me and considered in my medical decision making (see chart for details).  84 year old male with a history of COPD, alcohol abuse, and other PMH as noted above presents with shortness of breath.  Apparently he acutely worsened this evening although was feeling short of breath and weak over the last few days with some confusion as well.  I reviewed the past medical records in [place].  The patient was most recently admitted for dyspnea on exertion, peripheral edema, and alcohol withdrawal last month.  Per the notes, his  daughter called the PMDs office today reporting that the patient has had confusion, sweats, and weakness over the last few days.  On exam, the patient is extremely anxious and agitated appearing.  He is diaphoretic.  He repeatedly is trying to get out of the bed, saying "I can't breathe" and "give me air."  We are able to verbally redirect him and get him back in the bed but he is unable to keep a nasal cannula on.  He is tachypneic and somewhat tachycardic.  The O2 saturation was in the high 80s on room air but comes up to the 90s with 2 L by nasal cannula.  He has some rales in bilateral lung fields worse on the left.  There is no significant peripheral edema.  Overall I suspect most likely COPD exacerbation versus possible pneumonia, or less likely COVID-19 or other viral etiology.  I have a lower suspicion for CHF given the lack of prior CHF history and the lack of significant edema.  I suspect that the patient's mental status is due to hypercapnia although there could be some element of alcohol withdrawal or altered mental status due to an infection.   Due to the patient's respiratory distress and increased work of breathing as well as the possibility of hypercapnia, we will put him on BiPAP.  I have ordered a chest x-ray, lab work-up, and will reassess.  ----------------------------------------- 10:46 PM on 03/28/2019 -----------------------------------------  The patient continued to be agitated, anxious, and diaphoretic on the BiPAP.  We gave a dose of Ativan and he seems to be improving.  Chest x-ray shows evidence of pneumonia.  I will order antibiotics for HCAP.  I discussed the patient's clinical status and the plan of care with the daughter over the phone.  I verified that the patient is full code.  She also mentioned that the patient has not had any alcohol in the house since detox over a month ago, and so she does not suspect alcohol withdrawal.  ----------------------------------------- 12:30 AM on 03/29/2019 -----------------------------------------  Although the patient appeared to briefly improve after the initial dose of Ativan, he quickly became agitated, tachycardic, and diaphoretic once again.  2 additional doses of Ativan were given each time with brief improvement, and then recurrence of his agitation and significant tachypnea.  The patient's respiratory rate increased to about 50 and he became tachycardic to the 130s.  He appeared more altered.  Due to his impending respiratory failure, worsening mental status and work of breathing despite treatment with BiPAP and constant verbal redirection, he required intubation.  The patient was intubated successfully on the first attempt.  Chest x-ray will be obtained to confirm tube placement, and sedation has been initiated.  I discussed the case with the hospitalist NP [place] for admission.  ____________________________________________   FINAL CLINICAL IMPRESSION(S) / ED DIAGNOSES  Final diagnoses:  Acute on chronic respiratory failure, unspecified whether with hypoxia or hypercapnia  (HCC)      NEW MEDICATIONS STARTED DURING THIS VISIT:  New Prescriptions   No medications on file     Note:  This document was prepared using Dragon voice recognition software and may include unintentional dictation errors.    Arta Silence, MD 03/29/19 [phone]

## 2019-03-29 ENCOUNTER — Emergency Department: Payer: Medicare Other

## 2019-03-29 ENCOUNTER — Inpatient Hospital Stay: Payer: Medicare Other

## 2019-03-29 ENCOUNTER — Inpatient Hospital Stay: Payer: Self-pay

## 2019-03-29 DIAGNOSIS — Z7982 Long term (current) use of aspirin: Secondary | ICD-10-CM | POA: Diagnosis not present

## 2019-03-29 DIAGNOSIS — J9621 Acute and chronic respiratory failure with hypoxia: Secondary | ICD-10-CM | POA: Diagnosis present

## 2019-03-29 DIAGNOSIS — Z79899 Other long term (current) drug therapy: Secondary | ICD-10-CM | POA: Diagnosis not present

## 2019-03-29 DIAGNOSIS — Z791 Long term (current) use of non-steroidal anti-inflammatories (NSAID): Secondary | ICD-10-CM | POA: Diagnosis not present

## 2019-03-29 DIAGNOSIS — H409 Unspecified glaucoma: Secondary | ICD-10-CM | POA: Diagnosis present

## 2019-03-29 DIAGNOSIS — M810 Age-related osteoporosis without current pathological fracture: Secondary | ICD-10-CM | POA: Diagnosis present

## 2019-03-29 DIAGNOSIS — J440 Chronic obstructive pulmonary disease with acute lower respiratory infection: Secondary | ICD-10-CM | POA: Diagnosis present

## 2019-03-29 DIAGNOSIS — F1010 Alcohol abuse, uncomplicated: Secondary | ICD-10-CM | POA: Diagnosis present

## 2019-03-29 DIAGNOSIS — Z881 Allergy status to other antibiotic agents status: Secondary | ICD-10-CM | POA: Diagnosis not present

## 2019-03-29 DIAGNOSIS — Z85828 Personal history of other malignant neoplasm of skin: Secondary | ICD-10-CM | POA: Diagnosis not present

## 2019-03-29 DIAGNOSIS — J189 Pneumonia, unspecified organism: Secondary | ICD-10-CM | POA: Diagnosis present

## 2019-03-29 DIAGNOSIS — J181 Lobar pneumonia, unspecified organism: Secondary | ICD-10-CM | POA: Diagnosis present

## 2019-03-29 DIAGNOSIS — R0602 Shortness of breath: Secondary | ICD-10-CM | POA: Diagnosis present

## 2019-03-29 DIAGNOSIS — I5032 Chronic diastolic (congestive) heart failure: Secondary | ICD-10-CM | POA: Diagnosis present

## 2019-03-29 DIAGNOSIS — J9622 Acute and chronic respiratory failure with hypercapnia: Secondary | ICD-10-CM | POA: Diagnosis present

## 2019-03-29 DIAGNOSIS — Z8249 Family history of ischemic heart disease and other diseases of the circulatory system: Secondary | ICD-10-CM | POA: Diagnosis not present

## 2019-03-29 DIAGNOSIS — G894 Chronic pain syndrome: Secondary | ICD-10-CM | POA: Diagnosis present

## 2019-03-29 DIAGNOSIS — K219 Gastro-esophageal reflux disease without esophagitis: Secondary | ICD-10-CM | POA: Diagnosis present

## 2019-03-29 DIAGNOSIS — F17220 Nicotine dependence, chewing tobacco, uncomplicated: Secondary | ICD-10-CM | POA: Diagnosis present

## 2019-03-29 DIAGNOSIS — J9601 Acute respiratory failure with hypoxia: Secondary | ICD-10-CM | POA: Diagnosis not present

## 2019-03-29 DIAGNOSIS — Z6834 Body mass index (BMI) 34.0-34.9, adult: Secondary | ICD-10-CM | POA: Diagnosis not present

## 2019-03-29 DIAGNOSIS — K5900 Constipation, unspecified: Secondary | ICD-10-CM | POA: Diagnosis not present

## 2019-03-29 DIAGNOSIS — I110 Hypertensive heart disease with heart failure: Secondary | ICD-10-CM | POA: Diagnosis present

## 2019-03-29 DIAGNOSIS — E440 Moderate protein-calorie malnutrition: Secondary | ICD-10-CM | POA: Diagnosis present

## 2019-03-29 DIAGNOSIS — Z818 Family history of other mental and behavioral disorders: Secondary | ICD-10-CM | POA: Diagnosis not present

## 2019-03-29 DIAGNOSIS — J441 Chronic obstructive pulmonary disease with (acute) exacerbation: Secondary | ICD-10-CM

## 2019-03-29 DIAGNOSIS — Z20828 Contact with and (suspected) exposure to other viral communicable diseases: Secondary | ICD-10-CM | POA: Diagnosis present

## 2019-03-29 LAB — BLOOD GAS, ARTERIAL
Acid-base deficit: 2.3 mmol/L — ABNORMAL HIGH (ref 0.0–2.0)
Acid-base deficit: 4.8 mmol/L — ABNORMAL HIGH (ref 0.0–2.0)
Bicarbonate: 22.1 mmol/L (ref 20.0–28.0)
Bicarbonate: 22.5 mmol/L (ref 20.0–28.0)
FIO2: 0.5
FIO2: 0.5
MECHVT: 500 mL
MECHVT: 500 mL
Mechanical Rate: 20
Mechanical Rate: 20
O2 Saturation: 97.8 %
O2 Saturation: 99 %
PEEP: 5 cmH2O
PEEP: 5 cmH2O
Patient temperature: 37
Patient temperature: 37
pCO2 arterial: 38 mmHg (ref 32.0–48.0)
pCO2 arterial: 47 mmHg (ref 32.0–48.0)
pH, Arterial: 7.28 — ABNORMAL LOW (ref 7.350–7.450)
pH, Arterial: 7.38 (ref 7.350–7.450)
pO2, Arterial: 113 mmHg — ABNORMAL HIGH (ref 83.0–108.0)
pO2, Arterial: 135 mmHg — ABNORMAL HIGH (ref 83.0–108.0)

## 2019-03-29 LAB — STREP PNEUMONIAE URINARY ANTIGEN: Strep Pneumo Urinary Antigen: NEGATIVE

## 2019-03-29 LAB — CBC
HCT: 42.5 % (ref 39.0–52.0)
Hemoglobin: 13.4 g/dL (ref 13.0–17.0)
MCH: 29.5 pg (ref 26.0–34.0)
MCHC: 31.5 g/dL (ref 30.0–36.0)
MCV: 93.6 fL (ref 80.0–100.0)
Platelets: 169 10*3/uL (ref 150–400)
RBC: 4.54 MIL/uL (ref 4.22–5.81)
RDW: 15.9 % — ABNORMAL HIGH (ref 11.5–15.5)
WBC: 7.1 10*3/uL (ref 4.0–10.5)
nRBC: 0 % (ref 0.0–0.2)

## 2019-03-29 LAB — BASIC METABOLIC PANEL
Anion gap: 8 (ref 5–15)
BUN: 35 mg/dL — ABNORMAL HIGH (ref 8–23)
CO2: 21 mmol/L — ABNORMAL LOW (ref 22–32)
Calcium: 9.3 mg/dL (ref 8.9–10.3)
Chloride: 113 mmol/L — ABNORMAL HIGH (ref 98–111)
Creatinine, Ser: 1.14 mg/dL (ref 0.61–1.24)
GFR calc Af Amer: >60 mL/min (ref >60)
GFR calc non Af Amer: 60 mL/min — ABNORMAL LOW (ref >60)
Glucose, Bld: 179 mg/dL — ABNORMAL HIGH (ref 70–99)
Potassium: 4.5 mmol/L (ref 3.5–5.1)
Sodium: 142 mmol/L (ref 135–145)

## 2019-03-29 LAB — URINE DRUG SCREEN, QUALITATIVE (ARMC ONLY)
Amphetamines, Ur Screen: NOT DETECTED
Barbiturates, Ur Screen: NOT DETECTED
Benzodiazepine, Ur Scrn: POSITIVE — AB
Cannabinoid 50 Ng, Ur ~~LOC~~: NOT DETECTED
Cocaine Metabolite,Ur ~~LOC~~: NOT DETECTED
MDMA (Ecstasy)Ur Screen: NOT DETECTED
Methadone Scn, Ur: NOT DETECTED
Opiate, Ur Screen: POSITIVE — AB
Phencyclidine (PCP) Ur S: NOT DETECTED
Tricyclic, Ur Screen: POSITIVE — AB

## 2019-03-29 LAB — PHOSPHORUS: Phosphorus: 4 mg/dL (ref 2.5–4.6)

## 2019-03-29 LAB — GLUCOSE, CAPILLARY
Glucose-Capillary: 178 mg/dL — ABNORMAL HIGH (ref 70–99)
Glucose-Capillary: 119 mg/dL — ABNORMAL HIGH (ref 70–99)
Glucose-Capillary: 155 mg/dL — ABNORMAL HIGH (ref 70–99)

## 2019-03-29 LAB — HIV ANTIBODY (ROUTINE TESTING W REFLEX): HIV Screen 4th Generation wRfx: NONREACTIVE

## 2019-03-29 LAB — TRIGLYCERIDES: Triglycerides: 187 mg/dL — ABNORMAL HIGH (ref <150)

## 2019-03-29 LAB — MAGNESIUM: Magnesium: 2.1 mg/dL (ref 1.7–2.4)

## 2019-03-29 LAB — MRSA PCR SCREENING: MRSA by PCR: NEGATIVE

## 2019-03-29 LAB — PROCALCITONIN: Procalcitonin: 0.26 ng/mL

## 2019-03-29 MED ORDER — ACETAMINOPHEN 650 MG RE SUPP: 650.0000 mg | Freq: Four times a day (QID) | RECTAL | Status: DC | PRN

## 2019-03-29 MED ORDER — FENTANYL CITRATE (PF) 100 MCG/2ML IJ SOLN
25.0000 ug | INTRAMUSCULAR | Status: DC | PRN
  Administered 2019-03-29 (×2): 25 ug via INTRAVENOUS
  Filled 2019-03-29 (×2): qty 2

## 2019-03-29 MED ORDER — VANCOMYCIN HCL IN DEXTROSE 1-5 GM/200ML-% IV SOLN
1000.0000 mg | INTRAVENOUS | Status: DC
  Filled 2019-03-29: qty 200

## 2019-03-29 MED ORDER — FENTANYL CITRATE (PF) 100 MCG/2ML IJ SOLN
25.0000 ug | INTRAMUSCULAR | Status: DC | PRN
  Administered 2019-03-29: 25 ug via INTRAVENOUS
  Administered 2019-03-30 (×3): 100 ug via INTRAVENOUS
  Filled 2019-03-29 (×4): qty 2

## 2019-03-29 MED ORDER — ACETAMINOPHEN 325 MG PO TABS
650.0000 mg | ORAL_TABLET | Freq: Four times a day (QID) | ORAL | Status: DC | PRN
  Administered 2019-03-29: 03:00:00 650 mg via ORAL
  Filled 2019-03-29: qty 2

## 2019-03-29 MED ORDER — SODIUM CHLORIDE 0.9 % IV SOLN
INTRAVENOUS | Status: DC
  Administered 2019-03-29: 02:00:00 via INTRAVENOUS

## 2019-03-29 MED ORDER — CHLORHEXIDINE GLUCONATE CLOTH 2 % EX PADS
6.0000 | MEDICATED_PAD | Freq: Every day | CUTANEOUS | Status: DC
  Administered 2019-03-30 – 2019-04-02 (×4): 6 via TOPICAL

## 2019-03-29 MED ORDER — METHYLPREDNISOLONE SODIUM SUCC 40 MG IJ SOLR
40.0000 mg | Freq: Two times a day (BID) | INTRAMUSCULAR | Status: DC
  Administered 2019-03-29 – 2019-04-01 (×7): 40 mg via INTRAVENOUS
  Filled 2019-03-29 (×7): qty 1

## 2019-03-29 MED ORDER — SODIUM CHLORIDE 0.9 % IV SOLN
2.0000 g | Freq: Three times a day (TID) | INTRAVENOUS | Status: DC
  Administered 2019-03-29 – 2019-03-31 (×5): 2 g via INTRAVENOUS
  Filled 2019-03-29 (×7): qty 2

## 2019-03-29 MED ORDER — THIAMINE HCL 100 MG/ML IJ SOLN
100.0000 mg | Freq: Every day | INTRAMUSCULAR | Status: DC
  Administered 2019-03-29 – 2019-04-02 (×5): 100 mg via INTRAVENOUS
  Filled 2019-03-29 (×5): qty 2

## 2019-03-29 MED ORDER — FAMOTIDINE IN NACL 20-0.9 MG/50ML-% IV SOLN
20.0000 mg | INTRAVENOUS | Status: DC
  Administered 2019-03-29 – 2019-04-02 (×5): 20 mg via INTRAVENOUS
  Filled 2019-03-29 (×6): qty 50

## 2019-03-29 MED ORDER — CHLORHEXIDINE GLUCONATE 0.12% ORAL RINSE (MEDLINE KIT)
15.0000 mL | Freq: Two times a day (BID) | OROMUCOSAL | Status: DC
  Administered 2019-03-29 – 2019-03-30 (×2): 15 mL via OROMUCOSAL

## 2019-03-29 MED ORDER — SODIUM CHLORIDE 0.9% FLUSH
10.0000 mL | Freq: Two times a day (BID) | INTRAVENOUS | Status: DC
  Administered 2019-03-29 – 2019-04-02 (×10): 10 mL

## 2019-03-29 MED ORDER — VANCOMYCIN HCL IN DEXTROSE 1-5 GM/200ML-% IV SOLN: 1000.0000 mg | INTRAVENOUS | Status: DC

## 2019-03-29 MED ORDER — THIAMINE HCL 100 MG/ML IJ SOLN
Freq: Once | INTRAVENOUS | Status: DC
  Administered 2019-03-29: 04:00:00 via INTRAVENOUS
  Filled 2019-03-29: qty 1000

## 2019-03-29 MED ORDER — BUDESONIDE 0.25 MG/2ML IN SUSP
0.2500 mg | Freq: Two times a day (BID) | RESPIRATORY_TRACT | Status: DC
  Administered 2019-03-29 – 2019-04-02 (×8): 0.25 mg via RESPIRATORY_TRACT
  Filled 2019-03-29 (×9): qty 2

## 2019-03-29 MED ORDER — SODIUM CHLORIDE 0.9% FLUSH: 10.0000 mL | INTRAVENOUS | Status: DC | PRN

## 2019-03-29 MED ORDER — ORAL CARE MOUTH RINSE
15.0000 mL | OROMUCOSAL | Status: DC
  Administered 2019-03-29 – 2019-03-30 (×8): 15 mL via OROMUCOSAL

## 2019-03-29 MED ORDER — THIAMINE HCL 100 MG/ML IJ SOLN
Freq: Once | INTRAVENOUS | Status: AC
  Administered 2019-03-29: 04:00:00 via INTRAVENOUS
  Filled 2019-03-29 (×2): qty 1000

## 2019-03-29 MED ORDER — THIAMINE HCL 100 MG/ML IJ SOLN: Freq: Once | INTRAVENOUS | Status: DC

## 2019-03-29 MED ORDER — ENOXAPARIN SODIUM 40 MG/0.4ML ~~LOC~~ SOLN
40.0000 mg | SUBCUTANEOUS | Status: DC
  Administered 2019-03-29 – 2019-04-01 (×4): 40 mg via SUBCUTANEOUS
  Filled 2019-03-29 (×4): qty 0.4

## 2019-03-29 MED ORDER — MIDAZOLAM HCL 5 MG/5ML IJ SOLN
4.0000 mg | Freq: Once | INTRAMUSCULAR | Status: AC
  Administered 2019-03-29: 4 mg via INTRAVENOUS

## 2019-03-29 MED ORDER — IPRATROPIUM-ALBUTEROL 0.5-2.5 (3) MG/3ML IN SOLN
3.0000 mL | RESPIRATORY_TRACT | Status: DC
  Administered 2019-03-29 (×2): 3 mL via RESPIRATORY_TRACT
  Filled 2019-03-29 (×2): qty 3

## 2019-03-29 MED ORDER — SENNOSIDES 8.8 MG/5ML PO SYRP
5.0000 mL | ORAL_SOLUTION | Freq: Two times a day (BID) | ORAL | Status: DC | PRN
  Filled 2019-03-29: qty 5

## 2019-03-29 MED ORDER — PROPOFOL 1000 MG/100ML IV EMUL
0.0000 ug/kg/min | INTRAVENOUS | Status: DC
  Administered 2019-03-29: 50 ug/kg/min via INTRAVENOUS
  Administered 2019-03-29 (×5): 45 ug/kg/min via INTRAVENOUS
  Administered 2019-03-29: 50 ug/kg/min via INTRAVENOUS
  Administered 2019-03-29: 45 ug/kg/min via INTRAVENOUS
  Administered 2019-03-30 (×2): 50 ug/kg/min via INTRAVENOUS
  Administered 2019-03-30: 07:00:00 45 ug/kg/min via INTRAVENOUS
  Filled 2019-03-29 (×10): qty 100

## 2019-03-29 MED ORDER — BISACODYL 10 MG RE SUPP: 10.0000 mg | Freq: Every day | RECTAL | Status: DC | PRN

## 2019-03-29 MED ORDER — IPRATROPIUM-ALBUTEROL 0.5-2.5 (3) MG/3ML IN SOLN
3.0000 mL | Freq: Four times a day (QID) | RESPIRATORY_TRACT | Status: DC
  Administered 2019-03-29 – 2019-04-01 (×10): 3 mL via RESPIRATORY_TRACT
  Filled 2019-03-29 (×12): qty 3

## 2019-03-29 MED ORDER — FOLIC ACID 5 MG/ML IJ SOLN
1.0000 mg | Freq: Every day | INTRAMUSCULAR | Status: DC
  Administered 2019-03-29 – 2019-04-02 (×5): 1 mg via INTRAVENOUS
  Filled 2019-03-29 (×5): qty 0.2

## 2019-03-29 MED ORDER — SODIUM CHLORIDE 0.9 % IV SOLN
2.0000 g | Freq: Three times a day (TID) | INTRAVENOUS | Status: DC
  Administered 2019-03-29: 2 g via INTRAVENOUS

## 2019-03-29 NOTE — ED Notes (Signed)
Pt more agitated, not tolerating bipap well, this rn at bedside holding mask on pt's face intermittently with sitter intermittently. MD aware.

## 2019-03-29 NOTE — H&P (Addendum)
[facility] [place] NAME: Aaron Gibbs    MR#:  [id number]  DATE OF BIRTH:  [birth year]  DATE OF ADMISSION:  03/28/2019  PRIMARY CARE PHYSICIAN: Olin Hauser, DO   REQUESTING/REFERRING PHYSICIAN: Arta Silence, MD  CHIEF COMPLAINT:   Chief Complaint  Patient presents with   Shortness of Breath    HISTORY OF PRESENT ILLNESS:  84 y.o. male with pertinent past medical history of COPD, EtOH abuse,major depressive disorder with suicidal ideation, anxiety and glaucoma presenting to the ED with chief complaints of confusion, dizziness, shortness of breath, falls and diaphoresis  Patient is currently intubated, history obtained from patient's chart. Per patient's chart, patient's daughter called his PCP complaining that patient had fallen twice in the last couple of days, with confusion and sweats.  No reports of fevers or chills, worsening cough, chest pain, abdominal pain, diarrhea, headache, or any other symptoms.  EMS was called however he was not taken to the hospital as they felt that he did not need to be evaluated.  Patient's daughter reports that he has not had alcohol in about 2 months.  Patient's daughter was advised by his PCP to take him to the ED for further evaluation.  On arrival to the ED, he was afebrile with blood pressure 158/72mm Hg and pulse rate 110 beats/min.  He was noted to be very short of breath satting 89% on room air. There were no focal neurological deficits; he was alert but disoriented and agitated at staff.  He was also noted to be diaphoretic and using his accessory muscles of respiration.  He was tachypneic and somewhat tachycardic therefore was placed on nonrebreather with oxygen saturation improvement to 99% however he continued to remain agitated and pulling off the nonrebreather.  He was placed on BiPAP continue to be agitated, anxious, and diaphoretic on the BiPAP.  He was given Ativan IV with  no improvement minimal improvement.  Due to his impending respiratory failure, worsening mental status and work of breathing despite treatment with BiPAP and constant verbal redirection, he was intubated for airway protection.  Chest x-ray was obtained which showed evidence of pneumonia.  Initial labs showed glucose 145, AST 52, ALT 45, CBC unremarkable.  UA with moderate leuks.  COVID-19 negative.  Hospitalist asked to admit to the ICU for further management.  PAST MEDICAL HISTORY:   Past Medical History:  Diagnosis Date   Anxiety    Cancer ([facility])    skin   COPD (chronic obstructive pulmonary disease) (HCC)    Glaucoma    Osteoporosis    osteopenia    PAST SURGICAL HISTORY:   Past Surgical History:  Procedure Laterality Date   CHOLECYSTECTOMY     TRANSURETHRAL RESECTION OF PROSTATE N/A 2008    SOCIAL HISTORY:   Social History   Tobacco Use   Smoking status: Former Smoker    Years: 15.00   Smokeless tobacco: Current User    Types: Chew   Tobacco comment: stopped 15 years ago  Substance Use Topics   Alcohol use: Yes    Comment: past    FAMILY HISTORY:   Family History  Problem Relation Age of Onset   Depression Mother    Heart disease Father     DRUG ALLERGIES:   Allergies  Allergen Reactions   Azithromycin Shortness Of Breath    REVIEW OF SYSTEMS:   Review of Systems  Unable to perform ROS: Severe respiratory distress   MEDICATIONS AT  HOME:   Prior to Admission medications   Medication Sig Start Date End Date Taking? Authorizing Provider  albuterol (VENTOLIN HFA) 108 (90 Base) MCG/ACT inhaler Inhale 1-2 puffs into the lungs every 6 (six) hours as needed for wheezing or shortness of breath. 02/01/19  Yes Norval Gable, MD  amLODipine (NORVASC) 10 MG tablet Take 10 mg by mouth daily.   Yes [provider]  aspirin EC 81 MG tablet Take 81 mg by mouth daily.   Yes [provider]  buPROPion (WELLBUTRIN SR) 150 MG 12 hr tablet  Take 150 mg by mouth 2 (two) times daily.  02/26/19  Yes [provider]  DULoxetine (CYMBALTA) 60 MG capsule Take 60 mg by mouth daily.  11/26/18  Yes [provider]  fenofibrate (TRICOR) 145 MG tablet Take 145 mg by mouth daily.   Yes [provider]  ferrous sulfate 325 (65 FE) MG tablet Take 325 mg by mouth daily with breakfast.   Yes [provider]  LORazepam (ATIVAN) 0.5 MG tablet Take 1 tablet (0.5 mg total) by mouth daily as needed for anxiety. 02/17/19  Yes [name], MD  meloxicam (MOBIC) 15 MG tablet Take 1 tablet (15 mg total) by mouth daily. 03/17/19 06/15/19 Yes Milinda Pointer, MD  Multiple Vitamin (MULTIVITAMIN WITH MINERALS) TABS tablet Take 1 tablet by mouth daily.   Yes [provider]  Oxycodone HCl 10 MG TABS Take 1 tablet (10 mg total) by mouth 3 (three) times daily as needed. Must last 30 days. 03/26/19 04/25/19 Yes Milinda Pointer, MD  pantoprazole (PROTONIX) 40 MG tablet Take 40 mg by mouth daily.    Yes [provider]  prednisoLONE acetate (PRED FORTE) 1 % ophthalmic suspension Place 2 drops into the left eye daily. 02/18/19  Yes [name], MD  pregabalin (LYRICA) 150 MG capsule Take 1 capsule (150 mg total) by mouth 3 (three) times daily. Must last 30 days 03/17/19 06/15/19 Yes Milinda Pointer, MD  QUEtiapine (SEROQUEL) 100 MG tablet Take 100 mg by mouth at bedtime.  11/22/18  Yes [provider]  timolol (TIMOPTIC) 0.5 % ophthalmic solution  03/12/19  Yes [provider]  traZODone (DESYREL) 50 MG tablet  02/26/19  Yes [provider]  umeclidinium-vilanterol (ANORO ELLIPTA) 62.5-25 MCG/INH AEPB Inhale 1 puff into the lungs daily.   Yes [provider]  timolol (BETIMOL) 0.25 % ophthalmic solution 1-2 drops 2 (two) times daily.    [provider]      VITAL SIGNS:  Blood pressure (!) 169/91, pulse (!) 115, temperature 98.1 F (36.7 C), temperature  source Oral, resp. rate (!) 32, height 5\' 10"  (1.778 m), weight 108 kg, SpO2 94 %.  PHYSICAL EXAMINATION:   Physical Exam  GENERAL:  84 y.o.-year-old patient lying in the bed intubated and sedated. Diaphoretic. EYES: Pupils equal, round, reactive to light and accommodation. No scleral icterus. Extraocular muscles intact.  HEENT: Head atraumatic, normocephalic. Oropharynx and nasopharynx clear.  NECK:  Supple, no jugular venous distention. No thyroid enlargement, no tenderness.  LUNGS: Decreased breath sounds bilaterally, mild wheezing, rales,rhonchi or crepitation. Moderate use of accessory muscles of respiration.  CARDIOVASCULAR: S1, S2 normal. No murmurs, rubs, or gallops.  ABDOMEN: Soft, nontender, nondistended. Bowel sounds present. No organomegaly or mass.  EXTREMITIES: No pedal edema, cyanosis, or clubbing.  NEUROLOGIC: Cranial nerves II through XII are intact.  Patient is currently intubated and sedated unable to assess muscle strength.. Sensation intact. Gait not checked.  PSYCHIATRIC: Unable to assess  patient is currently intubated and sedated SKIN: No obvious rash, lesion, or ulcer.   DATA REVIEWED:  LABORATORY PANEL:   CBC Recent Labs  Lab 03/28/19 2132  WBC 7.7  HGB 13.3  HCT 41.7  PLT 179   ------------------------------------------------------------------------------------------------------------------  Chemistries  Recent Labs  Lab 03/28/19 2132  NA 140  K 4.5  CL 109  CO2 19*  GLUCOSE 145*  BUN 33*  CREATININE 1.21  CALCIUM 9.3  AST 52*  ALT 45*  ALKPHOS 51  BILITOT 0.8   ------------------------------------------------------------------------------------------------------------------  Cardiac Enzymes No results for input(s): TROPONINI in the last 168 hours. ------------------------------------------------------------------------------------------------------------------  RADIOLOGY:  Dg Chest 1 View  Result Date: 03/29/2019 CLINICAL DATA:   Post intubation EXAM: CHEST  1 VIEW COMPARISON:  Radiograph 03/28/2019, 01/22/2019, CT a chest 07/02/2018 FINDINGS: Endotracheal tube terminates in the mid trachea approximately 5 cm from the carina. A transesophageal tube tip projects in the region of the lower thoracic esophagus but is poorly visualized due to patient body habitus. There is extensive consolidative opacity throughout the left lung with a somewhat mass like appearance in the left mid lung. No pneumothorax. Suspect trace bilateral effusions. Cardiomediastinal contours are similar to prior portable radiographs. Degenerative changes are present in the and imaged spine and shoulders. No acute osseous or soft tissue abnormality. IMPRESSION: 1. Endotracheal tube terminates in the mid trachea approximately 5 cm from the carina. 2. A transesophageal tube tip projects in the region of the lower thoracic esophagus but is poorly visualized due to patient body habitus. Consider abdominal radiograph to verify tip positioning. 3. Extensive consolidative opacity throughout the left lung with a somewhat mass like appearance in the left mid lung, concerning for pneumonia. Recommend follow-up imaging post therapeutic intervention to ensure resolution Electronically Signed   By: Lovena Le M.D.   On: 03/29/2019 00:43   Dg Chest Portable 1 View  Result Date: 03/28/2019 CLINICAL DATA:  Per EMS report, patient c/o shortness of breath while at home. EXAM: PORTABLE CHEST 1 VIEW COMPARISON:  Chest radiograph 02/13/2019 FINDINGS: Stable cardiomediastinal contours. There is a new area of consolidation in the left mid lung suspicious for infection. Bandlike opacity in the right lower lobe may reflect atelectasis. No pneumothorax or large pleural effusion. No acute finding in the visualized skeleton IMPRESSION: 1. New area of consolidation in the left mid lung suspicious for pneumonia. 2. Bandlike opacity in the right lower lobe may reflect atelectasis. Electronically  Signed   By: Audie Pinto M.D.   On: 03/28/2019 22:06    EKG:  EKG: unchanged from previous tracings, sinus tachycardia. Vent. rate 111 BPM PR interval * ms QRS duration 97 ms QT/QTc 323/439 ms P-R-T axes -46 10 49 IMPRESSION AND PLAN:   84 y.o. male with pertinent past medical history of COPD, EtOH abuse,major depressive disorder with suicidal ideation, anxiety and glaucoma presenting with increased confusion, shortness of breath, dizziness, falls and diaphoresis.  1. Acute on chronic respiratory failure with hypoxemia -li kely secondary to COPD exacerbation and pneumonia - Admit to ICU - On mechanical ventilation - We will get CT head due to multiple falls and altered mental status - Management per ICU team  2.  Left lobar pneumonia - Chest x-ray shows extensive consolidative opacity throughout the left lung concerning for pneumonia - Blood cultures pending - Check procalcitonin - Check urine strep and Legionella pneumonia - Start empiric treatment with cefepime and vancomycin  3. Acute COPD Exacerbation secondary to pneumonia? - On mechanical ventilation - Bronchodilators (  albuterol/ipratropium) standing and PRN - IV Solu-Medrol - Antibiotics as above  4. EtOH abuse - ? Last Drink 2 months ago per daughter - Monitor for EtOH Withdrawal - Monitor CMP, INR, Daily BMP+Mg - Banana Bag x1 (1L NS or D5W, 100mg  thiamine, 1mg  folate, 1amp/tab MVI, 3g magnesium sulfate)  - PT/OT evaluation for mobility - CIWA +/- Standing Protocol  5. DVT prophylaxis - Enoxaparin / Heparin SubQ if poor mobility and stay >24hrs   All the records are reviewed and case discussed with ED provider. Management plans discussed with the patient, family and they are in agreement.  CODE STATUS: FULL  TOTAL TIME TAKING CARE OF THIS PATIENT: 50 minutes.    on 03/29/2019 at 1:11 AM   Rufina Falco, DNP, FNP-BC [facility] Nurse Practitioner Between 7am to 6pm - Pager [phone]  After 6pm go to [email or web address] - password [email or web address] [facility]  Office  [phone]  CC: Primary care physician; Olin Hauser, DO

## 2019-03-29 NOTE — Progress Notes (Signed)
[facility] at [place] NAME: Aaron Gibbs    MR#:  [id number]  DATE OF BIRTH:  10-21-1934  SUBJECTIVE:  CHIEF COMPLAINT:   Chief Complaint  Patient presents with  . Shortness of Breath   Patient currently sedated on the vent. REVIEW OF SYSTEMS:  ROS unobtainable due to patient being sedated on the vent  DRUG ALLERGIES:   Allergies  Allergen Reactions  . Azithromycin Shortness Of Breath   VITALS:  Blood pressure 133/66, pulse 72, temperature 98.6 F (37 C), temperature source Axillary, resp. rate 20, height 5\' 10"  (1.778 m), weight 109 kg, SpO2 98 %. PHYSICAL EXAMINATION:  Physical Exam  GENERAL:  84 y.o.-year-old patient lying in the bed intubated and sedated. Diaphoretic. EYES: Pupils equal, round, reactive to light and accommodation. No scleral icterus.  HEENT: Head atraumatic, normocephalic. Oropharynx and nasopharynx clear.  ET tube in place NECK: Supple, no jugular venous distention. No thyroid enlargement, no tenderness.  LUNGS: Decreased breath sounds bilaterally, mild wheezing, rales,rhonchi or crepitation. Moderate use of accessory muscles of respiration.  CARDIOVASCULAR: S1, S2 normal. No murmurs, rubs, or gallops.  ABDOMEN: Soft, nontender, nondistended. Bowel sounds present. No organomegaly or mass.  EXTREMITIES: No pedal edema, cyanosis, or clubbing.  NEUROLOGIC: Patient is currently intubated and sedated unable to assess muscle strength..  Gait not checked.  PSYCHIATRIC: Unable to assess patient is currently intubated and sedated SKIN: No obvious rash, lesion, or ulcer.    LABORATORY PANEL:  Male CBC Recent Labs  Lab 03/29/19 0221  WBC 7.1  HGB 13.4  HCT 42.5  PLT 169   ------------------------------------------------------------------------------------------------------------------ Chemistries  Recent Labs  Lab 03/28/19 2132 03/29/19 0221  NA 140 142  K 4.5 4.5  CL 109 113*  CO2 19* 21*   GLUCOSE 145* 179*  BUN 33* 35*  CREATININE 1.21 1.14  CALCIUM 9.3 9.3  MG  --  2.1  AST 52*  --   ALT 45*  --   ALKPHOS 51  --   BILITOT 0.8  --    RADIOLOGY:  Dg Chest 1 View  Result Date: 03/29/2019 CLINICAL DATA:  Post intubation EXAM: CHEST  1 VIEW COMPARISON:  Radiograph 03/28/2019, 01/22/2019, CT a chest 07/02/2018 FINDINGS: Endotracheal tube terminates in the mid trachea approximately 5 cm from the carina. A transesophageal tube tip projects in the region of the lower thoracic esophagus but is poorly visualized due to patient body habitus. There is extensive consolidative opacity throughout the left lung with a somewhat mass like appearance in the left mid lung. No pneumothorax. Suspect trace bilateral effusions. Cardiomediastinal contours are similar to prior portable radiographs. Degenerative changes are present in the and imaged spine and shoulders. No acute osseous or soft tissue abnormality. IMPRESSION: 1. Endotracheal tube terminates in the mid trachea approximately 5 cm from the carina. 2. A transesophageal tube tip projects in the region of the lower thoracic esophagus but is poorly visualized due to patient body habitus. Consider abdominal radiograph to verify tip positioning. 3. Extensive consolidative opacity throughout the left lung with a somewhat mass like appearance in the left mid lung, concerning for pneumonia. Recommend follow-up imaging post therapeutic intervention to ensure resolution Electronically Signed   By: Lovena Le M.D.   On: 03/29/2019 00:43   Ct Head Wo Contrast  Result Date: 03/29/2019 CLINICAL DATA:  Generalized weakness EXAM: CT HEAD WITHOUT CONTRAST TECHNIQUE: Contiguous axial images were obtained from the base of the skull through the vertex without intravenous  contrast. COMPARISON:  CT brain 06/10/2018 FINDINGS: Brain: There is no mass, hemorrhage or extra-axial collection. There is generalized atrophy without lobar predilection. Hypodensity of the  white matter is most commonly associated with chronic microvascular disease. Vascular: No abnormal hyperdensity of the major intracranial arteries or dural venous sinuses. No intracranial atherosclerosis. Skull: The visualized skull base, calvarium and extracranial soft tissues are normal. Sinuses/Orbits: No fluid levels or advanced mucosal thickening of the visualized paranasal sinuses. No mastoid or middle ear effusion. The orbits are normal. IMPRESSION: Generalized atrophy and chronic microvascular ischemia without acute intracranial abnormality. Electronically Signed   By: Ulyses Jarred M.D.   On: 03/29/2019 02:00   Dg Chest Portable 1 View  Result Date: 03/28/2019 CLINICAL DATA:  Per EMS report, patient c/o shortness of breath while at home. EXAM: PORTABLE CHEST 1 VIEW COMPARISON:  Chest radiograph 02/13/2019 FINDINGS: Stable cardiomediastinal contours. There is a new area of consolidation in the left mid lung suspicious for infection. Bandlike opacity in the right lower lobe may reflect atelectasis. No pneumothorax or large pleural effusion. No acute finding in the visualized skeleton IMPRESSION: 1. New area of consolidation in the left mid lung suspicious for pneumonia. 2. Bandlike opacity in the right lower lobe may reflect atelectasis. Electronically Signed   By: Audie Pinto M.D.   On: 03/28/2019 22:06   [place] Ekg Site Rite  Result Date: 03/29/2019 If Site Rite image not attached, placement could not be confirmed due to current cardiac rhythm.  ASSESSMENT AND PLAN:   84 y.o. male with pertinent past medical history of COPD, EtOH abuse,major depressive disorder with suicidal ideation, anxiety and glaucoma presenting with increased confusion, shortness of breath, dizziness, falls and diaphoresis.  1. Acute on chronic respiratory failure with hypoxemia -likely secondary to COPD exacerbation and pneumonia - Admitted to ICU - On mechanical ventilation. Patient had CT head done which  revealed generalized atrophy and chronic microvascular ischemia without acute intracranial abnormality. - Management per ICU team  2.  Left lobar pneumonia - Chest x-ray shows extensive consolidative opacity throughout the left lung concerning for pneumonia.  COVID test negative. Continue broad-spectrum IV antibiotics with vancomycin and cefepime.  Follow-up on cultures  3. Acute COPD Exacerbation secondary to pneumonia? - On mechanical ventilation - Bronchodilators (albuterol/ipratropium) standing and PRN - IV Solu-Medrol - Antibiotics as above  4. EtOH abuse - ? Last Drink 2 months ago per daughter - Monitor for EtOH Withdrawal.  Continue thiamine, folic acid.  Was also given multivitamin Patient currently sedated on the vent.   DVT prophylaxis - Enoxaparin   All the records are reviewed    CODE STATUS: Full Code  TOTAL TIME TAKING CARE OF THIS PATIENT: 30 minutes.   More than 50% of the time was spent in counseling/coordination of care: YES  POSSIBLE D/C IN 3 DAYS, DEPENDING ON CLINICAL CONDITION.   Keiona Jenison M.D on 03/29/2019 at 2:52 PM  Between 7am to 6pm - Pager - [phone]  After 6pm go to [email or web address] - Proofreader  [facility]  Office  [phone]  CC: Primary care physician; Olin Hauser, DO  Note: This dictation was prepared with Dragon dictation along with smaller phrase technology. Any transcriptional errors that result from this process are unintentional.

## 2019-03-29 NOTE — Progress Notes (Signed)
Unable to collect sputum culture,RN  Aware.

## 2019-03-29 NOTE — Progress Notes (Signed)
Pharmacy Antibiotic Note  Aaron Gibbs is a 84 y.o. male admitted on 03/28/2019 with pneumonia.  Pharmacy has been consulted for vanc/cefepime dosing.  Plan: Patient received vanc 2g IV load + cefepime 2g IV x 1 in ED  Patient does not have documented CKD, but appears to be perhaps in a slight AKI w/ a baseline Scr of 0.8 - 0.9 from a few months ago. A regimen of vanc 1.25g IV q24h would have resulted in appropriate AUC/Cssmin thresholds; however, given his questionable renal function will dose a bit more conservatively with the following:  Vancomycin 1000 mg IV Q 24 hrs. Goal AUC 400-550. Expected AUC: 413.9 SCr used: 1.21 (his baseline is around 0.8 - 0.9) Cssmin: 10.1  Will continue cefepime 2g IV q12h per CrCl 30 - 60 ml/min and will continue to monitor renal fx and s/sx of infx.  Height: 5\' 10"  (177.8 cm) Weight: 238 lb 1.6 oz (108 kg) IBW/kg (Calculated) : 73  Temp (24hrs), Avg:98.1 F (36.7 C), Min:98.1 F (36.7 C), Max:98.1 F (36.7 C)  Recent Labs  Lab 03/28/19 2132  WBC 7.7  CREATININE 1.21  LATICACIDVEN 1.9    Estimated Creatinine Clearance: 57.9 mL/min (by C-G formula based on SCr of 1.21 mg/dL).    Allergies  Allergen Reactions  . Azithromycin Shortness Of Breath    Thank you for allowing pharmacy to be a part of this patient's care.  Tobie Lords, PharmD, BCPS Clinical Pharmacist 03/29/2019 12:53 AM

## 2019-03-29 NOTE — Consult Note (Signed)
PHARMACY NOTE:  ANTIMICROBIAL RENAL DOSAGE ADJUSTMENT  Current antimicrobial regimen includes a mismatch between antimicrobial dosage and estimated renal function.  As per policy approved by the Pharmacy & Therapeutics and Medical Executive Committees, the antimicrobial dosage will be adjusted accordingly.  Current antimicrobial dosage:  Cefepime 2g q12H   Indication: PNA  Renal Function:  Estimated Creatinine Clearance: 61.8 mL/min (by C-G formula based on SCr of 1.14 mg/dL).    Antimicrobial dosage has been changed to:  Cefepime 2g q8H   Thank you for allowing pharmacy to be a part of this patient's care.  Oswald Hillock, [facility] 03/29/2019 10:28 AM

## 2019-03-29 NOTE — ED Notes (Signed)
Pt more agitated, difficult to keep mask on pt and keep pt from removing equipment. Pt very diaphoretic at this time and states "I can't breathe". Md called to bedside, decision made to intubate.

## 2019-03-29 NOTE — ED Notes (Signed)
Pt continues to bite ETT. Sedation increased as per order.

## 2019-03-29 NOTE — ED Notes (Signed)
Pt intubated by dr. Cherylann Banas with 7.5 ett at 23 at teeth.

## 2019-03-29 NOTE — Progress Notes (Addendum)
Peripherally Inserted Central Catheter/Midline Placement  The IV Nurse placed placed PICC after emergency consent by [facility] NP.  The benefits include less needle sticks, lab draws from the catheter, and the patient may be discharged home with the catheter. Risks include, but not limited to, infection, bleeding, blood clot (thrombus formation), and puncture of an artery; nerve damage and irregular heartbeat and possibility to perform a PICC exchange if needed/ordered by physician.  Alternatives to this procedure were also discussed.  Bard Power PICC patient education guide, fact sheet on infection prevention and patient information card has been provided to patient /or left at bedside.    PICC/Midline Placement Documentation  PICC Triple Lumen [id number] PICC Right Basilic 44 cm 0 cm (Active)  Indication for Insertion or Continuance of Line Vasoactive infusions 03/29/19 0323  Exposed Catheter (cm) 0 cm 03/29/19 0323  Site Assessment Clean;Dry;Intact 03/29/19 0323  Lumen #1 Status Blood return noted;Flushed;Saline locked 03/29/19 0323  Lumen #2 Status Blood return noted;Flushed;Saline locked 03/29/19 0323  Lumen #3 Status Blood return noted;Flushed;Saline locked 03/29/19 0323  Dressing Type Transparent;Occlusive;Securing device 03/29/19 0323  Dressing Status Clean;Dry;Intact;Antimicrobial disc in place;Other (Comment) 03/29/19 0323  Line Adjustment (NICU/IV Team Only) No 03/29/19 0323  Dressing Intervention New dressing 03/29/19 0323  Dressing Change Due 04/05/19 03/29/19 0323       Edson Snowball 03/29/2019, 3:36 AM

## 2019-03-29 NOTE — ED Notes (Signed)
Pt continues to bite ett. Sedation increased per order.

## 2019-03-29 NOTE — Consult Note (Signed)
PULMONARY / CRITICAL CARE MEDICINE  Name: Aaron Gibbs MRN: [id number] DOB: 1935/01/09    LOS: 0  Referring Provider: Rufina Falco, NP-BC Reason for Referral:  Acute hypoxic respiratory failure Brief patient description:  84 Y/O male with a h/o COPD and ETOH abuse, presented to the ED with worsening dyspnea, failed BiPAP and was intubated.   HPI: This is an 84 y/o caucasian male with a medical history significant for COPD, EtOH abuse,major depressive disorder with suicidal ideation, anxiety and glaucoma who presented to the ED with progressive dyspnea. History is obtained from patient's chart as he is currently intubated and sedated. Apparently, patient had fallen twice at home  And was getting more and more confused  hence her daughter called her PCP who then recommended that patient be evaluated in the ED. At the ED, patient was hypoxic, tachypneic and hypertensive. He was placed on BiPAP but became agitated hence intubated. His ED work-up was significant for extensive consolidation in the left lung. He is being admitted to the ICU for acute hypoxic respiratory failure and HCAP.  Of note, per patient's daughter, he has not had any alcohol in 2 months. His toxicology is positive for tricyclic, opiates and benzos. He did receive lorazepam, propofol and fentanyl.   Upon arrival in the ICU, patient had a temperature of 102 degrees F. His COVID test was negative  SIGNIFICANT EVENTS: 03/28/2019>admitted   Past Medical History:  Diagnosis Date  . Anxiety   . Cancer ([place])    skin  . COPD (chronic obstructive pulmonary disease) ([place])   . Glaucoma   . Osteoporosis    osteopenia   Past Surgical History:  Procedure Laterality Date  . CHOLECYSTECTOMY    . TRANSURETHRAL RESECTION OF PROSTATE N/A 2008   No current facility-administered medications on file prior to encounter.    Current Outpatient Medications on File Prior to Encounter  Medication Sig  . albuterol (VENTOLIN HFA) 108  (90 Base) MCG/ACT inhaler Inhale 1-2 puffs into the lungs every 6 (six) hours as needed for wheezing or shortness of breath.  Marland Kitchen amLODipine (NORVASC) 10 MG tablet Take 10 mg by mouth daily.  Marland Kitchen aspirin EC 81 MG tablet Take 81 mg by mouth daily.  Marland Kitchen buPROPion (WELLBUTRIN SR) 150 MG 12 hr tablet Take 150 mg by mouth 2 (two) times daily.   . DULoxetine (CYMBALTA) 60 MG capsule Take 60 mg by mouth daily.   . fenofibrate (TRICOR) 145 MG tablet Take 145 mg by mouth daily.  . ferrous sulfate 325 (65 FE) MG tablet Take 325 mg by mouth daily with breakfast.  . LORazepam (ATIVAN) 0.5 MG tablet Take 1 tablet (0.5 mg total) by mouth daily as needed for anxiety.  . meloxicam (MOBIC) 15 MG tablet Take 1 tablet (15 mg total) by mouth daily.  . Multiple Vitamin (MULTIVITAMIN WITH MINERALS) TABS tablet Take 1 tablet by mouth daily.  . Oxycodone HCl 10 MG TABS Take 1 tablet (10 mg total) by mouth 3 (three) times daily as needed. Must last 30 days.  . pantoprazole (PROTONIX) 40 MG tablet Take 40 mg by mouth daily.   . prednisoLONE acetate (PRED FORTE) 1 % ophthalmic suspension Place 2 drops into the left eye daily.  . pregabalin (LYRICA) 150 MG capsule Take 1 capsule (150 mg total) by mouth 3 (three) times daily. Must last 30 days  . QUEtiapine (SEROQUEL) 100 MG tablet Take 100 mg by mouth at bedtime.   . timolol (TIMOPTIC) 0.5 % ophthalmic solution   .  traZODone (DESYREL) 50 MG tablet   . umeclidinium-vilanterol (ANORO ELLIPTA) 62.5-25 MCG/INH AEPB Inhale 1 puff into the lungs daily.  . timolol (BETIMOL) 0.25 % ophthalmic solution 1-2 drops 2 (two) times daily.    Allergies Allergies  Allergen Reactions  . Azithromycin Shortness Of Breath    Family History Family History  Problem Relation Age of Onset  . Depression Mother   . Heart disease Father    Social History  reports that he has quit smoking. He quit after 15.00 years of use. His smokeless tobacco use includes chew. He reports current alcohol use.  He reports that he does not use drugs.  Review Of Systems:  Unable to obtain as patient is currently intubated and sedated.  VITAL SIGNS: BP (!) 148/79   Pulse (!) 117   Temp 98.1 F (36.7 C) (Oral)   Resp (!) 34   Ht 5\' 10"  (1.778 m)   Wt 108 kg   SpO2 96%   BMI 34.16 kg/m   HEMODYNAMICS:    VENTILATOR SETTINGS: Vent Mode: AC FiO2 (%):  [50 %] 50 % Set Rate:  [20 bmp] 20 bmp Vt Set:  [500 mL] 500 mL PEEP:  [5 cmH20] 5 cmH20  INTAKE / OUTPUT: No intake/output data recorded.  PHYSICAL EXAMINATION: General: NAD, intubated and sedated HEENT:  /AT, PERRLA, trachea midline, no JVD Neuro: Unresponsive to noxious stimulus, corneal and gag reflexes intact Cardiovascular:  AP regular, S1/S2, no MRG, +2 pulses Lungs:  BL breath sounds Abdomen:  ND/NT, +BS, no organomegaly  Musculoskeletal:  +rom, no deformities Skin:  Warm and dry  LABS:  BMET Recent Labs  Lab 03/28/19 2132  NA 140  K 4.5  CL 109  CO2 19*  BUN 33*  CREATININE 1.21  GLUCOSE 145*    Electrolytes Recent Labs  Lab 03/28/19 2132  CALCIUM 9.3    CBC Recent Labs  Lab 03/28/19 2132  WBC 7.7  HGB 13.3  HCT 41.7  PLT 179    Coag's No results for input(s): APTT, INR in the last 168 hours.  Sepsis Markers Recent Labs  Lab 03/28/19 2132  LATICACIDVEN 1.9    ABG No results for input(s): PHART, PCO2ART, PO2ART in the last 168 hours.  Liver Enzymes Recent Labs  Lab 03/28/19 2132  AST 52*  ALT 45*  ALKPHOS 51  BILITOT 0.8  ALBUMIN 3.4*    Cardiac Enzymes No results for input(s): TROPONINI, PROBNP in the last 168 hours.  Glucose No results for input(s): GLUCAP in the last 168 hours.  Imaging Dg Chest 1 View  Result Date: 03/29/2019 CLINICAL DATA:  Post intubation EXAM: CHEST  1 VIEW COMPARISON:  Radiograph 03/28/2019, 01/22/2019, CT a chest 07/02/2018 FINDINGS: Endotracheal tube terminates in the mid trachea approximately 5 cm from the carina. A transesophageal tube tip  projects in the region of the lower thoracic esophagus but is poorly visualized due to patient body habitus. There is extensive consolidative opacity throughout the left lung with a somewhat mass like appearance in the left mid lung. No pneumothorax. Suspect trace bilateral effusions. Cardiomediastinal contours are similar to prior portable radiographs. Degenerative changes are present in the and imaged spine and shoulders. No acute osseous or soft tissue abnormality. IMPRESSION: 1. Endotracheal tube terminates in the mid trachea approximately 5 cm from the carina. 2. A transesophageal tube tip projects in the region of the lower thoracic esophagus but is poorly visualized due to patient body habitus. Consider abdominal radiograph to verify tip positioning. 3. Extensive  consolidative opacity throughout the left lung with a somewhat mass like appearance in the left mid lung, concerning for pneumonia. Recommend follow-up imaging post therapeutic intervention to ensure resolution Electronically Signed   By: Lovena Le M.D.   On: 03/29/2019 00:43   Dg Chest Portable 1 View  Result Date: 03/28/2019 CLINICAL DATA:  Per EMS report, patient c/o shortness of breath while at home. EXAM: PORTABLE CHEST 1 VIEW COMPARISON:  Chest radiograph 02/13/2019 FINDINGS: Stable cardiomediastinal contours. There is a new area of consolidation in the left mid lung suspicious for infection. Bandlike opacity in the right lower lobe may reflect atelectasis. No pneumothorax or large pleural effusion. No acute finding in the visualized skeleton IMPRESSION: 1. New area of consolidation in the left mid lung suspicious for pneumonia. 2. Bandlike opacity in the right lower lobe may reflect atelectasis. Electronically Signed   By: Audie Pinto M.D.   On: 03/28/2019 22:06    STUDIES:  Echo 02/14/2019  1. The left ventricle has normal systolic function with an ejection fraction of 60-65%. The cavity size was normal. Left ventricular  diastolic Doppler parameters are consistent with impaired relaxation.  2. The right ventricle has normal systolic function. The cavity was normal. There is no increase in right ventricular wall thickness.Unable to estimate RVSP.  CULTURES: Blood cultures x 2> Urine culture> Sputum culture>  ANTIBIOTICS: Cefepime 10/9> Vanco 10/9>  LINES/TUBES: PICC line PIVs ETT Foley OGT  DISCUSSION: 84 Y/O presenting with acute encephalopathy, acute hypoxic respiratory failure and HCAP  ASSESSMENT Acute hypoxic respiratory failure HCAP-Left lung Acute COPD exacerbation 2/2 HCAP ETOH abuse Anxiety    PLAN  Full vent support with current settings ABG and CXR as needed Nebulized bronchodilator and IV steroids Weaning trials as tolerated Hemodynamics per ICU protocol Antibiotics as above CIWA protocol F/u cultures Trend PCT and adjust antibiotics Trend renal indices Monitor and correct electrolytes Fentanyl and propofol to maintain RASS 0 to -1 Banana bag x1 and then continue with daily thiamine and folic acid   Best Practice: Code Status:Full code Diet: NPO GI prophylaxis: Pepcid VTE prophylaxis:  Lovenox and SCDs.   FAMILY  - Updates: Family updated by admitting service  Zaydah Nawabi S. Tukov-Yual ANP-BC Pulmonary and Critical Care Medicine [facility] Pager [phone] or [phone]  COVID-19 DISASTER DECLARATION:   FULL CONTACT PHYSICAL EXAMINATION WAS NOT POSSIBLE DUE TO TREATMENT OF COVID-19  AND CONSERVATION OF PERSONAL PROTECTIVE EQUIPMENT, LIMITED EXAM FINDINGS INCLUDE-  Patient assessed or the symptoms described in the history of present illness.  In the context of the Global COVID-19 pandemic, which necessitated consideration that the patient might be at risk for infection with the SARS-CoV-2 virus that causes COVID-19, Institutional protocols and algorithms that pertain to the evaluation of patients at risk for COVID-19 are in a state of rapid change  based on information released by regulatory bodies including the CDC and federal and state organizations. These policies and algorithms were followed during the patient's care while in hospital.  NB: This document was prepared using Dragon voice recognition software and may include unintentional dictation errors.    03/29/2019, 1:57 AM

## 2019-03-29 NOTE — ED Notes (Signed)
ED TO INPATIENT HANDOFF REPORT  ED Nurse Name and Phone #: Parnell Spieler [phone]   S Name/Age/Gender Aaron Gibbs 84 y.o. male Room/Bed: ED18A/ED18A  Code Status   Code Status: Full Code  Home/SNF/Other Home Patient oriented to: self Is this baseline? No   Triage Complete: Triage complete  Chief Complaint SOB  Triage Note Per EMS report, patient c/o shortness of breath while at home. Upon arrival, patient was sitting up on the stretcher with his legs over the edge. EMT states patient becomes short of breath and then has panic attacks. Patient is legally blind and lives in an apartment by himself. Patient has a history of COPD. Patient was 89% on RA upon arrival.    Allergies Allergies  Allergen Reactions  . Azithromycin Shortness Of Breath    Level of Care/Admitting Diagnosis ED Disposition    ED Disposition Condition [facility] Area: [place] [[id number]]  Level of Care: ICU [6]  Covid Evaluation: Confirmed COVID Negative  Diagnosis: Pneumonia [id number]  Admitting Physician: Lang Snow [id number]  Attending Physician: Rufina Falco ACHIENG [id number]  Estimated length of stay: past midnight tomorrow  Certification:: I certify this patient will need inpatient services for at least 2 midnights  PT Class (Do Not Modify): Inpatient [101]  PT Acc Code (Do Not Modify): Private [1]       B Medical/Surgery History Past Medical History:  Diagnosis Date  . Anxiety   . Cancer ([place])    skin  . COPD (chronic obstructive pulmonary disease) ([place])   . Glaucoma   . Osteoporosis    osteopenia   Past Surgical History:  Procedure Laterality Date  . CHOLECYSTECTOMY    . TRANSURETHRAL RESECTION OF PROSTATE N/A 2008     A IV Location/Drains/Wounds Patient Lines/Drains/Airways Status   Active Line/Drains/Airways    Name:   Placement date:   Placement time:   Site:   Days:   Peripheral IV 03/28/19 Left Hand   03/28/19    2140     Hand   1   Peripheral IV 03/28/19 Left Forearm   03/28/19    2316    Forearm   1   NG/OG Tube Orogastric 18 Fr. Center mouth 65 cm   03/29/19    0014    Center mouth   less than 1   Urethral Catheter alissa, ed tech Straight-tip 16 Fr.   03/29/19    0016    Straight-tip   less than 1   Airway   03/29/19    0033     less than 1          Intake/Output Last 24 hours  Intake/Output Summary (Last 24 hours) at 03/29/2019 0045 Last data filed at 03/29/2019 0024 Gross per 24 hour  Intake 100 ml  Output -  Net 100 ml    Labs/Imaging Results for orders placed or performed during the hospital encounter of 03/28/19 (from the past 48 hour(s))  Comprehensive metabolic panel     Status: Abnormal   Collection Time: 03/28/19  9:32 PM  Result Value Ref Range   Sodium 140 135 - 145 mmol/L   Potassium 4.5 3.5 - 5.1 mmol/L   Chloride 109 98 - 111 mmol/L   CO2 19 (L) 22 - 32 mmol/L   Glucose, Bld 145 (H) 70 - 99 mg/dL   BUN 33 (H) 8 - 23 mg/dL   Creatinine, Ser 1.21 0.61 - 1.24 mg/dL   Calcium 9.3 8.9 -  10.3 mg/dL   Total Protein 7.4 6.5 - 8.1 g/dL   Albumin 3.4 (L) 3.5 - 5.0 g/dL   AST 52 (H) 15 - 41 U/L   ALT 45 (H) 0 - 44 U/L   Alkaline Phosphatase 51 38 - 126 U/L   Total Bilirubin 0.8 0.3 - 1.2 mg/dL   GFR calc non Af Amer 55 (L) >60 mL/min   GFR calc Af Amer >60 >60 mL/min   Anion gap 12 5 - 15    Comment: Performed at [facility], [address] [place], [place] [address]  CBC with Differential     Status: Abnormal   Collection Time: 03/28/19  9:32 PM  Result Value Ref Range   WBC 7.7 4.0 - 10.5 K/uL   RBC 4.51 4.22 - 5.81 MIL/uL   Hemoglobin 13.3 13.0 - 17.0 g/dL   HCT 41.7 39.0 - 52.0 %   MCV 92.5 80.0 - 100.0 fL   MCH 29.5 26.0 - 34.0 pg   MCHC 31.9 30.0 - 36.0 g/dL   RDW 15.9 (H) 11.5 - 15.5 %   Platelets 179 150 - 400 K/uL   nRBC 0.0 0.0 - 0.2 %   Neutrophils Relative % 63 %   Neutro Abs 4.8 1.7 - 7.7 K/uL   Lymphocytes Relative 18 %   Lymphs Abs 1.4 0.7 -  4.0 K/uL   Monocytes Relative 13 %   Monocytes Absolute 1.0 0.1 - 1.0 K/uL   Eosinophils Relative 5 %   Eosinophils Absolute 0.4 0.0 - 0.5 K/uL   Basophils Relative 0 %   Basophils Absolute 0.0 0.0 - 0.1 K/uL   Immature Granulocytes 1 %   Abs Immature Granulocytes 0.04 0.00 - 0.07 K/uL    Comment: Performed at [facility], [place], [place] [address]  Troponin I (High Sensitivity)     Status: None   Collection Time: 03/28/19  9:32 PM  Result Value Ref Range   Troponin I (High Sensitivity) 11 <18 ng/L    Comment: (NOTE) Elevated high sensitivity troponin I (hsTnI) values and significant  changes across serial measurements may suggest ACS but many other  chronic and acute conditions are known to elevate hsTnI results.  Refer to the "Links" section for chest pain algorithms and additional  guidance. Performed at [facility], [address] [place], [place] [address]   Brain natriuretic peptide     Status: None   Collection Time: 03/28/19  9:32 PM  Result Value Ref Range   B Natriuretic Peptide 52.0 0.0 - 100.0 pg/mL    Comment: Performed at [facility], [address] [place], [place] [address]  Lactic acid, plasma     Status: None   Collection Time: 03/28/19  9:32 PM  Result Value Ref Range   Lactic Acid, Venous 1.9 0.5 - 1.9 mmol/L    Comment: Performed at [facility], [address] [place], [place] [address]  Urinalysis, Complete w Microscopic     Status: Abnormal   Collection Time: 03/28/19 10:26 PM  Result Value Ref Range   Color, Urine YELLOW (A) YELLOW   APPearance HAZY (A) CLEAR   Specific Gravity, Urine 1.025 1.005 - 1.030   pH 5.0 5.0 - 8.0   Glucose, UA NEGATIVE NEGATIVE mg/dL   Hgb urine dipstick NEGATIVE NEGATIVE   Bilirubin Urine NEGATIVE NEGATIVE   Ketones, ur NEGATIVE NEGATIVE mg/dL   Protein, ur 30 (A) NEGATIVE mg/dL   Nitrite NEGATIVE NEGATIVE  Leukocytes,Ua MODERATE (A) NEGATIVE   RBC / HPF  0-5 0 - 5 RBC/hpf   WBC, UA >50 (H) 0 - 5 WBC/hpf   Bacteria, UA NONE SEEN NONE SEEN   Squamous Epithelial / LPF 0-5 0 - 5   Mucus PRESENT     Comment: Performed at [facility], [address] [place], [place] [address]  SARS Coronavirus 2 by RT PCR (hospital order, performed in [facility] hospital lab) Nasopharyngeal Nasopharyngeal Swab     Status: None   Collection Time: 03/28/19 10:52 PM   Specimen: Nasopharyngeal Swab  Result Value Ref Range   SARS Coronavirus 2 NEGATIVE NEGATIVE    Comment: (NOTE) If result is NEGATIVE SARS-CoV-2 target nucleic acids are NOT DETECTED. The SARS-CoV-2 RNA is generally detectable in upper and lower  respiratory specimens during the acute phase of infection. The lowest  concentration of SARS-CoV-2 viral copies this assay can detect is 250  copies / mL. A negative result does not preclude SARS-CoV-2 infection  and should not be used as the sole basis for treatment or other  patient management decisions.  A negative result may occur with  improper specimen collection / handling, submission of specimen other  than nasopharyngeal swab, presence of viral mutation(s) within the  areas targeted by this assay, and inadequate number of viral copies  (<250 copies / mL). A negative result must be combined with clinical  observations, patient history, and epidemiological information. If result is POSITIVE SARS-CoV-2 target nucleic acids are DETECTED. The SARS-CoV-2 RNA is generally detectable in upper and lower  respiratory specimens dur ing the acute phase of infection.  Positive  results are indicative of active infection with SARS-CoV-2.  Clinical  correlation with patient history and other diagnostic information is  necessary to determine patient infection status.  Positive results do  not rule out bacterial infection or co-infection with other viruses. If result is PRESUMPTIVE POSTIVE SARS-CoV-2 nucleic acids MAY BE PRESENT.   A presumptive  positive result was obtained on the submitted specimen  and confirmed on repeat testing.  While 2019 novel coronavirus  (SARS-CoV-2) nucleic acids may be present in the submitted sample  additional confirmatory testing may be necessary for epidemiological  and / or clinical management purposes  to differentiate between  SARS-CoV-2 and other Sarbecovirus currently known to infect humans.  If clinically indicated additional testing with an alternate test  methodology [phone]) is advised. The SARS-CoV-2 RNA is generally  detectable in upper and lower respiratory sp ecimens during the acute  phase of infection. The expected result is Negative. Fact Sheet for Patients:  StrictlyIdeas.no Fact Sheet for Healthcare Providers: BankingDealers.co.za This test is not yet approved or cleared by the [place] FDA and has been authorized for detection and/or diagnosis of SARS-CoV-2 by FDA under an Emergency Use Authorization (EUA).  This EUA will remain in effect (meaning this test can be used) for the duration of the COVID-19 declaration under Section 564(b)(1) of the Act, 21 U.S.C. section 360bbb-3(b)(1), unless the authorization is terminated or revoked sooner. Performed at [facility], [address] [place],  [address]    Dg Chest Portable 1 View  Result Date: 03/28/2019 CLINICAL DATA:  Per EMS report, patient c/o shortness of breath while at home. EXAM: PORTABLE CHEST 1 VIEW COMPARISON:  Chest radiograph 02/13/2019 FINDINGS: Stable cardiomediastinal contours. There is a new area of consolidation in the left mid lung suspicious for infection. Bandlike opacity in the right lower lobe may reflect atelectasis.  No pneumothorax or large pleural effusion. No acute finding in the visualized skeleton IMPRESSION: 1. New area of consolidation in the left mid lung suspicious for pneumonia. 2. Bandlike opacity in the right lower lobe may  reflect atelectasis. Electronically Signed   By: Audie Pinto M.D.   On: 03/28/2019 22:06    Pending Labs Unresulted Labs (From admission, onward)    Start     Ordered   04/05/19 0500  Creatinine, serum  (enoxaparin (LOVENOX)    CrCl >/= 30 ml/min)  Weekly,   STAT    Comments: while on enoxaparin therapy    03/29/19 0039   03/29/19 0500  CBC  Tomorrow morning,   STAT     03/29/19 0039   03/29/19 [id number]  Basic metabolic panel  Tomorrow morning,   STAT     03/29/19 0039   03/29/19 0036  HIV Antibody (routine testing w rflx)  (HIV Antibody (Routine testing w reflex) panel)  Once,   STAT     03/29/19 0039   03/29/19 0036  HIV4GL Save Tube  (HIV Antibody (Routine testing w reflex) panel)  Once,   STAT     03/29/19 0039   03/29/19 0033  Legionella Pneumophila Serogp 1 Ur Ag  Once,   STAT     03/29/19 0039   03/29/19 0033  Strep pneumoniae urinary antigen  (not at [facility])  Once,   STAT     03/29/19 0039   03/29/19 0033  Procalcitonin  Once,   STAT     03/29/19 0039   03/29/19 0033  Lactic acid, plasma  STAT Now then every 3 hours,   STAT     03/29/19 0039   03/29/19 0031  CBC  (enoxaparin (LOVENOX)    CrCl >/= 30 ml/min)  Once,   STAT    Comments: Baseline for enoxaparin therapy IF NOT ALREADY DRAWN.  Notify MD if PLT < 100 K.    03/29/19 0039   03/29/19 0031  Creatinine, serum  (enoxaparin (LOVENOX)    CrCl >/= 30 ml/min)  Once,   STAT    Comments: Baseline for enoxaparin therapy IF NOT ALREADY DRAWN.    03/29/19 0039   03/29/19 0024  Blood gas, arterial ([facility] & [facility] ONLY)  ONCE - STAT,   STAT     03/29/19 0023          Vitals/Pain Today's Vitals   03/29/19 0024 03/29/19 0027 03/29/19 0030 03/29/19 0036  BP: (!) 175/94 (!) 178/90 (!) 160/91 (!) 169/91  Pulse: (!) 116 (!) 118 (!) 114 (!) 115  Resp: 20 (!) 36 (!) 37 (!) 32  Temp:      TempSrc:      SpO2: 94% 95% 96% 94%  Weight:      Height:        Isolation Precautions No active isolations  Medications Medications   vancomycin (VANCOCIN) 2,000 mg in sodium chloride 0.9 % 500 mL IVPB (2,000 mg Intravenous New Bag/Given 03/29/19 0026)  ceFEPIme (MAXIPIME) 2 g in sodium chloride 0.9 % 100 mL IVPB (has no administration in time range)  propofol (DIPRIVAN) 1000 MG/100ML infusion (35 mcg/kg/min  108 kg Intravenous Rate/Dose Change 03/29/19 0045)  enoxaparin (LOVENOX) injection 40 mg (has no administration in time range)  0.9 %  sodium chloride infusion (has no administration in time range)  ceFEPIme (MAXIPIME) 2 g in sodium chloride 0.9 % 100 mL IVPB (has no administration in time range)  LORazepam (ATIVAN) injection 1 mg (1 mg Intravenous  Given 03/28/19 2206)  LORazepam (ATIVAN) injection 1 mg (1 mg Intravenous Given 03/28/19 2249)  ceFEPIme (MAXIPIME) 2 g in sodium chloride 0.9 % 100 mL IVPB (0 g Intravenous Stopped 03/29/19 0024)  LORazepam (ATIVAN) injection 1 mg (1 mg Intravenous Given 03/28/19 2352)  etomidate (AMIDATE) injection 30 mg (30 mg Intravenous Given 03/29/19 0001)  succinylcholine (ANECTINE) injection 120 mg (120 mg Intravenous Given 03/29/19 0001)  midazolam (VERSED) 5 MG/5ML injection 4 mg (4 mg Intravenous Given 03/29/19 0007)    Mobility walks with person assist High fall risk   Focused Assessments     R Recommendations: See Admitting Provider Note  Report given to:   Additional Notes: pt is legally blind

## 2019-03-29 NOTE — ED Notes (Signed)
Pt's phone, brown leather satchel and clothing placed in belonging bag and taken to CCU with pt. Pt to ct with RN and RT prior to admission.

## 2019-03-30 ENCOUNTER — Inpatient Hospital Stay: Payer: Medicare Other

## 2019-03-30 LAB — CBC
HCT: 39.2 % (ref 39.0–52.0)
Hemoglobin: 12.1 g/dL — ABNORMAL LOW (ref 13.0–17.0)
MCH: 29.2 pg (ref 26.0–34.0)
MCHC: 30.9 g/dL (ref 30.0–36.0)
MCV: 94.7 fL (ref 80.0–100.0)
Platelets: 157 10*3/uL (ref 150–400)
RBC: 4.14 MIL/uL — ABNORMAL LOW (ref 4.22–5.81)
RDW: 15.8 % — ABNORMAL HIGH (ref 11.5–15.5)
WBC: 5.7 10*3/uL (ref 4.0–10.5)
nRBC: 0 % (ref 0.0–0.2)

## 2019-03-30 LAB — COMPREHENSIVE METABOLIC PANEL
ALT: 56 U/L — ABNORMAL HIGH (ref 0–44)
AST: 45 U/L — ABNORMAL HIGH (ref 15–41)
Albumin: 3.1 g/dL — ABNORMAL LOW (ref 3.5–5.0)
Alkaline Phosphatase: 45 U/L (ref 38–126)
Anion gap: 12 (ref 5–15)
BUN: 24 mg/dL — ABNORMAL HIGH (ref 8–23)
CO2: 19 mmol/L — ABNORMAL LOW (ref 22–32)
Calcium: 9 mg/dL (ref 8.9–10.3)
Chloride: 111 mmol/L (ref 98–111)
Creatinine, Ser: 0.85 mg/dL (ref 0.61–1.24)
GFR calc Af Amer: >60 mL/min (ref >60)
GFR calc non Af Amer: >60 mL/min (ref >60)
Glucose, Bld: 167 mg/dL — ABNORMAL HIGH (ref 70–99)
Potassium: 4 mmol/L (ref 3.5–5.1)
Sodium: 142 mmol/L (ref 135–145)
Total Bilirubin: 0.7 mg/dL (ref 0.3–1.2)
Total Protein: 6.6 g/dL (ref 6.5–8.1)

## 2019-03-30 LAB — PROCALCITONIN: Procalcitonin: 0.14 ng/mL

## 2019-03-30 LAB — LEGIONELLA PNEUMOPHILA SEROGP 1 UR AG: L. pneumophila Serogp 1 Ur Ag: NEGATIVE

## 2019-03-30 LAB — URINE CULTURE: Culture: NO GROWTH

## 2019-03-30 LAB — GLUCOSE, CAPILLARY
Glucose-Capillary: 120 mg/dL — ABNORMAL HIGH (ref 70–99)
Glucose-Capillary: 154 mg/dL — ABNORMAL HIGH (ref 70–99)
Glucose-Capillary: 175 mg/dL — ABNORMAL HIGH (ref 70–99)

## 2019-03-30 LAB — PHOSPHORUS: Phosphorus: 3.4 mg/dL (ref 2.5–4.6)

## 2019-03-30 LAB — MAGNESIUM: Magnesium: 2.2 mg/dL (ref 1.7–2.4)

## 2019-03-30 LAB — TRIGLYCERIDES: Triglycerides: 228 mg/dL — ABNORMAL HIGH (ref <150)

## 2019-03-30 MED ORDER — MORPHINE SULFATE (PF) 2 MG/ML IV SOLN: 2.0000 mg | Freq: Once | INTRAVENOUS | Status: DC

## 2019-03-30 MED ORDER — MORPHINE SULFATE (PF) 2 MG/ML IV SOLN
INTRAVENOUS | Status: AC
  Administered 2019-03-30: 12:00:00 2 mg via INTRAVENOUS
  Filled 2019-03-30: qty 1

## 2019-03-30 MED ORDER — HYDROMORPHONE HCL 1 MG/ML IJ SOLN
1.0000 mg | INTRAMUSCULAR | Status: DC | PRN
  Administered 2019-03-30 – 2019-03-31 (×6): 1 mg via INTRAVENOUS
  Filled 2019-03-30 (×6): qty 1

## 2019-03-30 MED ORDER — HYDROMORPHONE HCL 1 MG/ML IJ SOLN
1.0000 mg | Freq: Once | INTRAMUSCULAR | Status: AC
  Administered 2019-03-30: 14:00:00 1 mg via INTRAVENOUS
  Filled 2019-03-30: qty 1

## 2019-03-30 NOTE — Progress Notes (Signed)
Patient extubated to room air at this time. Pt is alert and following some commands. Pt is complaining of pain. Dr. Mortimer Fries notified and orders received for IV Morphine. Daughter at bedside.

## 2019-03-30 NOTE — Progress Notes (Signed)
RT called to bedside to start spontaneous breathing trial. Propofol has been off for over 30 minutes. Pt is starting to wake up some but is still drowsy and is not following any commands. Pt is opening eyes to voice. Daughter is at bedside.

## 2019-03-30 NOTE — Progress Notes (Signed)
[place] [facility] [place] NAME: Solace Galetti    MR#:  [id number]  DATE OF BIRTH:  May 28, 1935  SUBJECTIVE:  CHIEF COMPLAINT:   Chief Complaint  Patient presents with  . Shortness of Breath   Patient currently sedated on the vent.  Family; daughter at bedside, intensivist at bedside updating family.  REVIEW OF SYSTEMS:  ROS unobtainable due to patient being sedated on the vent  DRUG ALLERGIES:   Allergies  Allergen Reactions  . Azithromycin Shortness Of Breath   VITALS:  Blood pressure 137/62, pulse 92, temperature 98.9 F (37.2 C), temperature source Axillary, resp. rate (!) 25, height 5\' 10"  (1.778 m), weight 108.7 kg, SpO2 94 %. PHYSICAL EXAMINATION:  Physical Exam  GENERAL:  84 y.o.-year-old patient lying in the bed intubated and sedated. Diaphoretic. EYES: Pupils equal, round, reactive to light and accommodation. No scleral icterus.  HEENT: Head atraumatic, normocephalic. Oropharynx and nasopharynx clear.  ET tube in place NECK: Supple, no jugular venous distention. No thyroid enlargement, no tenderness.  LUNGS: Decreased breath sounds bilaterally, mild wheezing, rales,rhonchi or crepitation. Moderate use of accessory muscles of respiration.  CARDIOVASCULAR: S1, S2 normal. No murmurs, rubs, or gallops.  ABDOMEN: Soft, nontender, nondistended. Bowel sounds present. No organomegaly or mass.  EXTREMITIES: No pedal edema, cyanosis, or clubbing.  NEUROLOGIC: Patient is currently intubated and sedated unable to assess muscle strength..  Gait not checked.  PSYCHIATRIC: Unable to assess patient is currently intubated and sedated SKIN: No obvious rash, lesion, or ulcer.    LABORATORY PANEL:  Male CBC Recent Labs  Lab 03/30/19 0324  WBC 5.7  HGB 12.1*  HCT 39.2  PLT 157   ------------------------------------------------------------------------------------------------------------------ Chemistries  Recent Labs  Lab 03/30/19  0324  NA 142  K 4.0  CL 111  CO2 19*  GLUCOSE 167*  BUN 24*  CREATININE 0.85  CALCIUM 9.0  MG 2.2  AST 45*  ALT 56*  ALKPHOS 45  BILITOT 0.7   RADIOLOGY:  Dg Chest Port 1 View  Result Date: 03/30/2019 CLINICAL DATA:  Acute respiratory failure EXAM: PORTABLE CHEST 1 VIEW COMPARISON:  03/29/2019 FINDINGS: Patient rotated rightward. Endotracheal tube unchanged. RIGHT PICC line in good position with tip in distal SVC. Low lung volumes. Patchy bilateral airspace disease similar to comparison exam. IMPRESSION: 1. Stable support apparatus. 2. A PICC line with tip in distal SVC. 3. Patchy bilateral airspace disease representing edema or pulmonary infection. Electronically Signed   By: Suzy Bouchard M.D.   On: 03/30/2019 08:50   ASSESSMENT AND PLAN:   84 y.o. male with pertinent past medical history of COPD, EtOH abuse,major depressive disorder with suicidal ideation, anxiety and glaucoma presenting with increased confusion, shortness of breath, dizziness, falls and diaphoresis.  1. Acute on chronic respiratory failure with hypoxemia -likely secondary to COPD exacerbation and pneumonia - Admitted to ICU - On mechanical ventilation.  Intensivist planning on extubating patient today Patient had CT head done which revealed generalized atrophy and chronic microvascular ischemia without acute intracranial abnormality. - Management per ICU team  2.  Left lobar pneumonia - Chest x-ray shows extensive consolidative opacity throughout the left lung concerning for pneumonia.  COVID test negative. Continue broad-spectrum IV antibiotics with vancomycin and cefepime.  Follow-up on cultures  3. Acute COPD Exacerbation secondary to pneumonia? - On mechanical ventilation - Bronchodilators (albuterol/ipratropium) standing and PRN - IV Solu-Medrol - Antibiotics as above  4. EtOH abuse - ? Last Drink 2 months ago per daughter -  Monitor for EtOH Withdrawal.  Continue thiamine, folic acid.  Was  also given multivitamin Patient currently sedated on the vent.   DVT prophylaxis - Enoxaparin   All the records are reviewed    CODE STATUS: Full Code  TOTAL TIME TAKING CARE OF THIS PATIENT: 31 minutes.   More than 50% of the time was spent in counseling/coordination of care: YES  POSSIBLE D/C IN 3 DAYS, DEPENDING ON CLINICAL CONDITION.   Pat Sires M.D on 03/30/2019 at 1:57 PM  Between 7am to 6pm - Pager - [phone]  After 6pm go to [email or web address] - Proofreader  [facility]  Office  [phone]  CC: Primary care physician; Olin Hauser, DO  Note: This dictation was prepared with Dragon dictation along with smaller phrase technology. Any transcriptional errors that result from this process are unintentional.

## 2019-03-30 NOTE — Progress Notes (Signed)
CRITICAL CARE NOTE SYNOPSIS Admitted for acute COPD exacerbation and pneumonia  CC  follow up respiratory failure  SUBJECTIVE Patient remains critically ill Prognosis is guarded   BP 115/60   Pulse 72   Temp 98.4 F (36.9 C) (Axillary)   Resp (!) 25   Ht '5\' 10"'$  (1.778 m)   Wt 108.7 kg   SpO2 96%   BMI 34.38 kg/m    I/O last 3 completed shifts: In: 1081.7 [I.V.:771.7; NG/GT:60; IV Piggyback:250] Out: 1050 [Urine:1050] Total I/O In: 400.6 [I.V.:250.6; IV Piggyback:150] Out: 125 [Urine:125]  SpO2: 96 % O2 Flow Rate (L/min): 3 L/min FiO2 (%): 28 %   SIGNIFICANT EVENTS   REVIEW OF SYSTEMS  PATIENT IS UNABLE TO PROVIDE COMPLETE REVIEW OF SYSTEMS DUE TO SEVERE CRITICAL ILLNESS   PHYSICAL EXAMINATION:  GENERAL:critically ill appearing, +resp distress HEAD: Normocephalic, atraumatic.  EYES: Pupils equal, round, reactive to light.  No scleral icterus.  MOUTH: Moist mucosal membrane. NECK: Supple.  PULMONARY: +rhonchi, +wheezing CARDIOVASCULAR: S1 and S2. Regular rate and rhythm. No murmurs, rubs, or gallops.  GASTROINTESTINAL: Soft, nontender, -distended. No masses. Positive bowel sounds. No hepatosplenomegaly.  MUSCULOSKELETAL: No swelling, clubbing, or edema.  NEUROLOGIC: obtunded, GCS<8 SKIN:intact,warm,dry  MEDICATIONS: I have reviewed all medications and confirmed regimen as documented   CULTURE RESULTS   Recent Results (from the past 240 hour(s))  SARS Coronavirus 2 by RT PCR (hospital order, performed in [facility] hospital lab) Nasopharyngeal Nasopharyngeal Swab     Status: None   Collection Time: 03/28/19 10:52 PM   Specimen: Nasopharyngeal Swab  Result Value Ref Range Status   SARS Coronavirus 2 NEGATIVE NEGATIVE Final    Comment: (NOTE) If result is NEGATIVE SARS-CoV-2 target nucleic acids are NOT DETECTED. The SARS-CoV-2 RNA is generally detectable in upper and lower  respiratory specimens during the acute phase of infection. The lowest   concentration of SARS-CoV-2 viral copies this assay can detect is 250  copies / mL. A negative result does not preclude SARS-CoV-2 infection  and should not be used as the sole basis for treatment or other  patient management decisions.  A negative result may occur with  improper specimen collection / handling, submission of specimen other  than nasopharyngeal swab, presence of viral mutation(s) within the  areas targeted by this assay, and inadequate number of viral copies  (<250 copies / mL). A negative result must be combined with clinical  observations, patient history, and epidemiological information. If result is POSITIVE SARS-CoV-2 target nucleic acids are DETECTED. The SARS-CoV-2 RNA is generally detectable in upper and lower  respiratory specimens dur ing the acute phase of infection.  Positive  results are indicative of active infection with SARS-CoV-2.  Clinical  correlation with patient history and other diagnostic information is  necessary to determine patient infection status.  Positive results do  not rule out bacterial infection or co-infection with other viruses. If result is PRESUMPTIVE POSTIVE SARS-CoV-2 nucleic acids MAY BE PRESENT.   A presumptive positive result was obtained on the submitted specimen  and confirmed on repeat testing.  While 2019 novel coronavirus  (SARS-CoV-2) nucleic acids may be present in the submitted sample  additional confirmatory testing may be necessary for epidemiological  and / or clinical management purposes  to differentiate between  SARS-CoV-2 and other Sarbecovirus currently known to infect humans.  If clinically indicated additional testing with an alternate test  methodology [phone]) is advised. The SARS-CoV-2 RNA is generally  detectable in upper and lower respiratory sp ecimens  during the acute  phase of infection. The expected result is Negative. Fact Sheet for Patients:  StrictlyIdeas.no Fact Sheet  for Healthcare Providers: BankingDealers.co.za This test is not yet approved or cleared by the [place] FDA and has been authorized for detection and/or diagnosis of SARS-CoV-2 by FDA under an Emergency Use Authorization (EUA).  This EUA will remain in effect (meaning this test can be used) for the duration of the COVID-19 declaration under Section 564(b)(1) of the Act, 21 U.S.C. section 360bbb-3(b)(1), unless the authorization is terminated or revoked sooner. Performed at [facility], [address] [place], [place] [address]   MRSA PCR Screening     Status: None   Collection Time: 03/29/19  2:50 AM   Specimen: Nasopharyngeal  Result Value Ref Range Status   MRSA by PCR NEGATIVE NEGATIVE Final    Comment:        The GeneXpert MRSA Assay (FDA approved for NASAL specimens only), is one component of a comprehensive MRSA colonization surveillance program. It is not intended to diagnose MRSA infection nor to guide or monitor treatment for MRSA infections. Performed at [facility], [address] [place], [place] [address]   Urine Culture     Status: None   Collection Time: 03/29/19  3:00 AM   Specimen: Urine, Random  Result Value Ref Range Status   Specimen Description   Final    URINE, RANDOM Performed at [facility], [address], [place], [place] [address]    Special Requests   Final    NONE Performed at [facility], [address], [place], [place] [address]    Culture   Final    NO GROWTH Performed at [facility], [place] [address], [place], [place] [address]    Report Status 03/30/2019 FINAL  Final  Culture, blood (Routine X 2) w Reflex to ID Panel     Status: None (Preliminary result)   Collection Time: 03/29/19  6:33 AM   Specimen: BLOOD  Result Value Ref Range Status   Specimen Description BLOOD R HAND  Final   Special Requests   Final    BOTTLES DRAWN AEROBIC ONLY Blood Culture  adequate volume   Culture   Final    NO GROWTH < 24 HOURS Performed at [facility], [address], [place], [place] [address]    Report Status PENDING  Incomplete  Culture, blood (Routine X 2) w Reflex to ID Panel     Status: None (Preliminary result)   Collection Time: 03/29/19  6:46 AM   Specimen: BLOOD  Result Value Ref Range Status   Specimen Description BLOOD RFOA  Final   Special Requests   Final    BOTTLES DRAWN AEROBIC AND ANAEROBIC Blood Culture adequate volume   Culture   Final    NO GROWTH < 24 HOURS Performed at [facility], [address] [place], [place] [address]    Report Status PENDING  Incomplete  Culture, respiratory (non-expectorated)     Status: None (Preliminary result)   Collection Time: 03/29/19  2:45 PM   Specimen: Tracheal Aspirate; Respiratory  Result Value Ref Range Status   Specimen Description   Final    TRACHEAL ASPIRATE Performed at [facility], [address] [place], [place] [address]    Special Requests   Final    NONE Performed at [facility], [address] [place], [place] [address]    Gram Stain   Final  MODERATE WBC PRESENT, PREDOMINANTLY PMN MODERATE GRAM POSITIVE COCCI IN CLUSTERS RARE GRAM NEGATIVE RODS Performed at [facility], [place] [address] [place], [place] [address]    Culture PENDING  Incomplete   Report Status PENDING  Incomplete        CBC    Component Value Date/Time   WBC 5.7 03/30/2019 0324   RBC 4.14 (L) 03/30/2019 0324   HGB 12.1 (L) 03/30/2019 0324   HCT 39.2 03/30/2019 0324   PLT 157 03/30/2019 0324   MCV 94.7 03/30/2019 0324   MCH 29.2 03/30/2019 0324   MCHC 30.9 03/30/2019 0324   RDW 15.8 (H) 03/30/2019 0324   LYMPHSABS 1.4 03/28/2019 2132   MONOABS 1.0 03/28/2019 2132   EOSABS 0.4 03/28/2019 2132   BASOSABS 0.0 03/28/2019 2132   BMP Latest Ref Rng & Units 03/30/2019 03/29/2019 03/28/2019  Glucose 70 - 99 mg/dL 167(H) 179(H) 145(H)  BUN 8 - 23  mg/dL 24(H) 35(H) 33(H)  Creatinine 0.61 - 1.24 mg/dL 0.85 1.14 1.21  BUN/Creat Ratio 10 - 24 - - -  Sodium 135 - 145 mmol/L 142 142 140  Potassium 3.5 - 5.1 mmol/L 4.0 4.5 4.5  Chloride 98 - 111 mmol/L 111 113(H) 109  CO2 22 - 32 mmol/L 19(L) 21(L) 19(L)  Calcium 8.9 - 10.3 mg/dL 9.0 9.3 9.3     IMAGING    Dg Chest Port 1 View  Result Date: 03/30/2019 CLINICAL DATA:  Acute respiratory failure EXAM: PORTABLE CHEST 1 VIEW COMPARISON:  03/29/2019 FINDINGS: Patient rotated rightward. Endotracheal tube unchanged. RIGHT PICC line in good position with tip in distal SVC. Low lung volumes. Patchy bilateral airspace disease similar to comparison exam. IMPRESSION: 1. Stable support apparatus. 2. A PICC line with tip in distal SVC. 3. Patchy bilateral airspace disease representing edema or pulmonary infection. Electronically Signed   By: Suzy Bouchard M.D.   On: 03/30/2019 08:50       Indwelling Urinary Catheter continued, requirement due to   Reason to continue Indwelling Urinary Catheter strict Intake/Output monitoring for hemodynamic instability         Ventilator continued, requirement due to severe respiratory failure   Ventilator Sedation RASS 0 to -2      ASSESSMENT AND PLAN SYNOPSIS   Severe ACUTE Hypoxic and Hypercapnic Respiratory Failure from LLL pneumonia and COPD exacerbation -continue Full MV support -continue Bronchodilator Therapy -Wean Fio2 and PEEP as tolerated -will perform SAT/SBT when respiratory parameters are met  SEVERE COPD EXACERBATION -continue IV steroids as prescribed -continue NEB THERAPY as prescribed -morphine as needed -wean fio2 as needed and tolerated    NEUROLOGY - intubated and sedated - minimal sedation to achieve a RASS goal: -1 Wake up assessment pending   CARDIAC ICU monitoring  ID -continue IV abx as prescibed -follow up cultures  GI GI PROPHYLAXIS as indicated  NUTRITIONAL STATUS DIET-->TF's as  tolerated Constipation protocol as indicated  ENDO - will use ICU hypoglycemic\Hyperglycemia protocol if indicated   ELECTROLYTES -follow labs as needed -replace as needed -pharmacy consultation and following   DVT/GI PRX ordered TRANSFUSIONS AS NEEDED MONITOR FSBS ASSESS the need for LABS as needed   Critical Care Time devoted to patient care services described in this note is 34 minutes.   Overall, patient is critically ill, prognosis is guarded.   high risk for cardiac arrest and death.    Corrin Parker, M.D.  [facility]  Medical Director [place] Director [place]  Department

## 2019-03-30 NOTE — Progress Notes (Signed)
Pt. Extubated to room air,sat 95.No apparent distress noted at this time.

## 2019-03-31 ENCOUNTER — Ambulatory Visit: Payer: Self-pay | Admitting: Nurse Practitioner

## 2019-03-31 ENCOUNTER — Telehealth: Payer: Self-pay | Admitting: Family Medicine

## 2019-03-31 ENCOUNTER — Institutional Professional Consult (permissible substitution): Payer: Medicare Other | Admitting: Pulmonary Disease

## 2019-03-31 ENCOUNTER — Inpatient Hospital Stay: Payer: Medicare Other

## 2019-03-31 LAB — GLUCOSE, CAPILLARY
Glucose-Capillary: 109 mg/dL — ABNORMAL HIGH (ref 70–99)
Glucose-Capillary: 125 mg/dL — ABNORMAL HIGH (ref 70–99)
Glucose-Capillary: 152 mg/dL — ABNORMAL HIGH (ref 70–99)
Glucose-Capillary: 178 mg/dL — ABNORMAL HIGH (ref 70–99)

## 2019-03-31 LAB — BASIC METABOLIC PANEL
Anion gap: 5 (ref 5–15)
BUN: 23 mg/dL (ref 8–23)
CO2: 23 mmol/L (ref 22–32)
Calcium: 9 mg/dL (ref 8.9–10.3)
Chloride: 115 mmol/L — ABNORMAL HIGH (ref 98–111)
Creatinine, Ser: 0.69 mg/dL (ref 0.61–1.24)
GFR calc Af Amer: >60 mL/min (ref >60)
GFR calc non Af Amer: >60 mL/min (ref >60)
Glucose, Bld: 124 mg/dL — ABNORMAL HIGH (ref 70–99)
Potassium: 3.9 mmol/L (ref 3.5–5.1)
Sodium: 143 mmol/L (ref 135–145)

## 2019-03-31 LAB — PROCALCITONIN: Procalcitonin: <0.1 ng/mL

## 2019-03-31 MED ORDER — PREDNISOLONE ACETATE 1 % OP SUSP
1.0000 [drp] | OPHTHALMIC | Status: DC
  Filled 2019-03-31: qty 1

## 2019-03-31 MED ORDER — SODIUM CHLORIDE 0.9 % IV SOLN
3.0000 g | Freq: Four times a day (QID) | INTRAVENOUS | Status: DC
  Administered 2019-03-31 – 2019-04-02 (×8): 3 g via INTRAVENOUS
  Filled 2019-03-31 (×2): qty 3
  Filled 2019-03-31: qty 8
  Filled 2019-03-31: qty 3
  Filled 2019-03-31 (×2): qty 8
  Filled 2019-03-31: qty 3
  Filled 2019-03-31 (×3): qty 8
  Filled 2019-03-31: qty 3

## 2019-03-31 MED ORDER — LIDOCAINE 5 % EX PTCH
1.0000 | MEDICATED_PATCH | CUTANEOUS | Status: DC
  Administered 2019-03-31 – 2019-04-01 (×2): 1 via TRANSDERMAL
  Filled 2019-03-31 (×3): qty 1

## 2019-03-31 MED ORDER — QUETIAPINE FUMARATE 25 MG PO TABS: 25.0000 mg | ORAL_TABLET | Freq: Every day | ORAL | Status: DC | PRN

## 2019-03-31 MED ORDER — DULOXETINE HCL 30 MG PO CPEP
60.0000 mg | ORAL_CAPSULE | Freq: Every day | ORAL | Status: DC
  Administered 2019-03-31 – 2019-04-01 (×2): 60 mg via ORAL
  Filled 2019-03-31 (×3): qty 2

## 2019-03-31 MED ORDER — HALOPERIDOL LACTATE 5 MG/ML IJ SOLN
5.0000 mg | Freq: Once | INTRAMUSCULAR | Status: AC
  Administered 2019-03-31: 5 mg via INTRAVENOUS
  Filled 2019-03-31: qty 1

## 2019-03-31 MED ORDER — PREDNISOLONE ACETATE 1 % OP SUSP
2.0000 [drp] | Freq: Every day | OPHTHALMIC | Status: DC
  Administered 2019-03-31 – 2019-04-02 (×2): 2 [drp] via OPHTHALMIC
  Filled 2019-03-31: qty 1

## 2019-03-31 MED ORDER — QUETIAPINE FUMARATE 25 MG PO TABS
100.0000 mg | ORAL_TABLET | Freq: Every day | ORAL | Status: DC
  Administered 2019-03-31: 100 mg via ORAL
  Filled 2019-03-31: qty 4

## 2019-03-31 MED ORDER — QUETIAPINE FUMARATE 25 MG PO TABS
125.0000 mg | ORAL_TABLET | Freq: Every day | ORAL | Status: DC
  Administered 2019-04-01: 125 mg via ORAL
  Filled 2019-03-31: qty 5

## 2019-03-31 MED ORDER — SODIUM CHLORIDE 0.9 % IV SOLN
INTRAVENOUS | Status: DC | PRN
  Administered 2019-03-31 – 2019-04-01 (×2): 250 mL via INTRAVENOUS

## 2019-03-31 MED ORDER — TIMOLOL MALEATE 0.5 % OP SOLN
1.0000 [drp] | Freq: Every day | OPHTHALMIC | Status: DC
  Administered 2019-03-31 – 2019-04-01 (×2): 1 [drp] via OPHTHALMIC
  Filled 2019-03-31: qty 5

## 2019-03-31 MED ORDER — SENNA 8.6 MG PO TABS
2.0000 | ORAL_TABLET | Freq: Every day | ORAL | Status: DC
  Administered 2019-04-01 – 2019-04-02 (×2): 17.2 mg via ORAL
  Filled 2019-03-31 (×2): qty 2

## 2019-03-31 NOTE — Telephone Encounter (Signed)
Pt's wife cancelled appt for him today and ask that nurse to call her.  He is in the hospital on a ventilator and is supposed to be off of it today but she wants him to have oxygen when he comes home  CB#  [phone]  [facility]

## 2019-03-31 NOTE — Telephone Encounter (Signed)
Agree with advice given.  Oxygen will be arranged from hospital if needed outpatient.

## 2019-03-31 NOTE — Evaluation (Addendum)
Clinical/Bedside Swallow Evaluation Patient Details  Name: Aaron Gibbs MRN: [id number] Date of Birth: [birth year]  Today's Date: 03/31/2019 Time: SLP Start Time (ACUTE ONLY): 0845 SLP Stop Time (ACUTE ONLY): 0945 SLP Time Calculation (min) (ACUTE ONLY): 60 min  Past Medical History:  Past Medical History:  Diagnosis Date  . Anxiety   . Cancer ([place])    skin  . COPD (chronic obstructive pulmonary disease) ([place])   . Glaucoma   . Osteoporosis    osteopenia   Past Surgical History:  Past Surgical History:  Procedure Laterality Date  . CHOLECYSTECTOMY    . TRANSURETHRAL RESECTION OF PROSTATE N/A 2008   HPI:  Pt is a 84 y.o. male with pertinent past medical history of COPD, ETOH use/abuse, major depressive disorder with suicidal ideation, anxiety, obesity, and glaucoma/Vision deficits in R eye per report presenting to the ED with chief complaints of confusion, dizziness, shortness of breath, falls and diaphoresis.  In the ED, he was noted to be very short of breath satting 89% on room air. There were no focal neurological deficits; he was alert but disoriented and agitated at staff.  He was also noted to be diaphoretic and using his accessory muscles of respiration.  He was tachypneic and somewhat tachycardic therefore was placed on nonrebreather with oxygen saturation improvement to 99% however he continued to remain agitated and pulling off the nonrebreather.  He was placed on BiPAP continue to be agitated, anxious, and diaphoretic on the BiPAP.  He was given Ativan IV with no improvement minimal improvement.  Due to his impending respiratory failure, worsening mental status and work of breathing despite treatment with BiPAP and constant verbal redirection, he was intubated for airway protection.  Pt was extubated on 03/30/2019 and is currently on Room Air.    Assessment / Plan / Recommendation Clinical Impression  Pt appeared to present w/ adequate oropharyngeal phase swallowing  function w/ the modified food trials, thin liquid trials assessed at this evalution today. Pt was not given trials of solid foods d/t declined Cognitive status and impulsive drinking behaviors as well as Edentulous status at the time. Pt required Mod. verbal/tactile cues for follow through w/ aspiration precautions and was given frequent reminders to slow down w/ oral intake --- suspect impact from baseline Cognitive decline. Pt does present w/ increased risk for aspiration w/ oral intake at this time.  Pt consumed trials of thin liquids via Cup w/ audible swallows and consecutive sips often, but no overt s/s of aspiration; no decline in vocal quality or respiratory status during/post trials. Oral phase WFL for bolus management and A-P transfer w/ trials of thin liquids and purees. No trials of solids were assessed at this eval d/t edentulous status and Cognitive status decline. Oral clearing noted b/t all trials. OM exam revealed a Slight R labial decline in tone(R corner of mouth) -- no anterior spillage or leakage noted. Speech clear, intelligible but noted paraphasias intermittently. Pt required full feeding Supervision and support d/t weakness and Cognitive decline(Impulsiveness). Recommend a dysphagia level 1 (Puree) diet w/ Thin consistency liquids via Cup; aspiration precautions; Pills in Puree for safer swallowing. ST services to f/u w/ pt's toleration of diet, trials to upgrade as well as potential need for Cognitive-linguistic evaluation. MD updated on evaluation findings.  SLP Visit Diagnosis: Dysphagia, oropharyngeal phase (R13.12)    Aspiration Risk  Mild aspiration risk    Diet Recommendation  Dysphagia level 1 (puree) w/ Thin liquids via CUP; aspiration precautions; supervision during meals d/t Impulsive  drinking/eating behaviors, Cognitive decline.    Medication Administration: Whole meds with puree(Crushed if necessary for safer swallowing)    Other  Recommendations Recommended Consults:  (dietician f/u) Oral Care Recommendations: Oral care BID;Staff/trained caregiver to provide oral care Other Recommendations: (n/a)   Follow up Recommendations (TBD)      Frequency and Duration min 3x week  2 weeks       Prognosis Prognosis for Safe Diet Advancement: Fair Barriers to Reach Goals: Cognitive deficits;Time post onset;Severity of deficits;Behavior      Swallow Study   General Date of Onset: 03/29/19 HPI: Pt is a 84 y.o. male with pertinent past medical history of COPD, ETOH use/abuse, major depressive disorder with suicidal ideation, anxiety, obesity, and glaucoma/Vision deficits in R eye per report presenting to the ED with chief complaints of confusion, dizziness, shortness of breath, falls and diaphoresis.  In the ED, he was noted to be very short of breath satting 89% on room air. There were no focal neurological deficits; he was alert but disoriented and agitated at staff.  He was also noted to be diaphoretic and using his accessory muscles of respiration.  He was tachypneic and somewhat tachycardic therefore was placed on nonrebreather with oxygen saturation improvement to 99% however he continued to remain agitated and pulling off the nonrebreather.  He was placed on BiPAP continue to be agitated, anxious, and diaphoretic on the BiPAP.  He was given Ativan IV with no improvement minimal improvement.  Due to his impending respiratory failure, worsening mental status and work of breathing despite treatment with BiPAP and constant verbal redirection, he was intubated for airway protection.  Pt was extubated on 03/30/2019 and is currently on Room Air.  Type of Study: Bedside Swallow Evaluation Previous Swallow Assessment: none reported Diet Prior to this Study: NPO Temperature Spikes Noted: No(wbc 5.7) Respiratory Status: Room air History of Recent Intubation: Yes Length of Intubations (days): 1 days Date extubated: 03/30/19 Behavior/Cognition: Alert;Cooperative;Pleasant  mood;Confused;Distractible;Requires cueing(semantic paraphasias noted) Oral Cavity Assessment: Dry;Dried secretions(sticky) Oral Care Completed by SLP: Yes Oral Cavity - Dentition: Edentulous Vision: Impaired for self-feeding Self-Feeding Abilities: Able to feed self;Needs assist;Needs set up;Total assist(hold cup to drink) Patient Positioning: Upright in bed(needed full positioning) Baseline Vocal Quality: Normal(mumbled speech; paraphasias) Volitional Cough: Cognitively unable to elicit Volitional Swallow: Unable to elicit    Oral/Motor/Sensory Function Overall Oral Motor/Sensory Function: Mild impairment Facial ROM: Reduced right(slight) Facial Symmetry: Abnormal symmetry right(slight) Facial Strength: Reduced right(slight) Lingual ROM: Within Functional Limits Lingual Symmetry: Within Functional Limits(grossly) Lingual Strength: Within Functional Limits Velum: Within Functional Limits Mandible: Within Functional Limits   Ice Chips Ice chips: Within functional limits Presentation: Spoon(fed; 3 trials) Other Comments: min decreased attention intermittently   Thin Liquid Thin Liquid: Within functional limits Presentation: Cup;Self Fed(~16 ozs) Other Comments: min Impulsive when drinking but no overt clinical s/s of aspiration    Nectar Thick Nectar Thick Liquid: Not tested   Honey Thick Honey Thick Liquid: Not tested   Puree Puree: Within functional limits Presentation: Spoon(fed; 10 trials) Other Comments: swallowed quickly at times then increased oral phase time at others -- decreased attention to task   Solid     Solid: Not tested Other Comments: edentulous       Orinda Kenner, MS, CCC-SLP Watson,Katherine 03/31/2019,2:44 PM

## 2019-03-31 NOTE — Evaluation (Signed)
Physical Therapy Evaluation Patient Details Name: Aaron Gibbs MRN: [id number] DOB: Oct 28, 1934 Today's Date: 03/31/2019   History of Present Illness  presented to ER secondary to AMS, dizziness, falls and SOB; admitted for acute/chronic respiratory failure with hypoxemia due to COPD exacerbation, CAP.  Hospital course significant for intubation 10/9-10/11; now weaned to RA.  Clinical Impression  Upon evaluation, patient alert and oriented to self only; follows simple commands, often requiring hand-over-hand for awareness of environment.  Bilat UE/LE strength ROM grossly symmetrical and WFL; no focal weakness appreciated. Able to complete bed mobility with sup; sit/stand, basic transfers and gait (30' x2) with RW, cga/min assist.  Demonstrates forward flexed posture with downward gaze; reciprocal stepping pattern with decreased step height/length; slow cadence and overall gait speed; min assist to guide RW due to unfamiliar environment. Maintains sats >94% on RA with exertion/gait. Would benefit from skilled PT to address above deficits and promote optimal return to PLOF; Recommend transition to [place] upon discharge from acute hospitalization.     Follow Up Recommendations Home health PT;Supervision/Assistance - 24 hour    Equipment Recommendations  (has RW)    Recommendations for Other Services       Precautions / Restrictions Precautions Precautions: Fall Precaution Comments: visual deficits-legally blind in L eye, and "doesn't see well from R eye" Restrictions Weight Bearing Restrictions: No      Mobility  Bed Mobility Overal bed mobility: Needs Assistance Bed Mobility: Supine to Sit;Sit to Supine     Supine to sit: Supervision Sit to supine: Supervision   General bed mobility comments: min cuing to guide movement; slightly impulsive  Transfers Overall transfer level: Needs assistance   Transfers: Sit to/from Stand Sit to Stand: Min guard;Min assist          General transfer comment: cuing for hand placement; hand-over-hand at times due to visual deficits  Ambulation/Gait Ambulation/Gait assistance: Min guard;Min assist Gait Distance (Feet): (30' x2) Assistive device: Rolling walker (2 wheeled)       General Gait Details: forward flexed posture with downward gaze; reciprocal stepping pattern with decreased step height/length; slow cadence and overall gait speed; min assist to guide RW due to unfamiliar environment. Maintains sats >94% on RA with exertion/gait  Stairs            Wheelchair Mobility    Modified Rankin (Stroke Patients Only)       Balance Overall balance assessment: Needs assistance Sitting-balance support: No upper extremity supported;Feet supported Sitting balance-Leahy Scale: Good     Standing balance support: Bilateral upper extremity supported Standing balance-Leahy Scale: Fair                               Pertinent Vitals/Pain Pain Assessment: No/denies pain    Home Living Family/patient expects to be discharged to:: Private residence Living Arrangements: Alone(hired caregiver)   Type of Home: House       Home Layout: One level Home Equipment: Environmental consultant - 2 wheels;Cane - single point      Prior Function Level of Independence: Independent with assistive device(s)         Comments: Mod indep with SPC for household mobility (intermittent use of RW at times); daughter provides intermittent assist with meals, errands.  Has aide that supports patient (primarily with household chores, nees) since January (present at bedside during evaluation)     Hand Dominance        Extremity/Trunk Assessment   Upper Extremity Assessment  Upper Extremity Assessment: Overall WFL for tasks assessed    Lower Extremity Assessment Lower Extremity Assessment: Overall WFL for tasks assessed(grossly 4-/5 throughout)       Communication   Communication: No difficulties  Cognition  Arousal/Alertness: Awake/alert Behavior During Therapy: WFL for tasks assessed/performed Overall Cognitive Status: History of cognitive impairments - at baseline                                 General Comments: oriented to self only      General Comments      Exercises Other Exercises Other Exercises: Sit/stand from various surface heights with transfers between seating surfaces-edge of bed, recliner, BSC, cga/min assist with RW. Hand-over-hand for UE placement and overall awareness of environment. Other Exercises: Toilet transfer, sPT without assist device, min assist with UEs on armrests; static standing for hygiene, min assist   Assessment/Plan    PT Assessment Patient needs continued PT services  PT Problem List Decreased strength;Decreased range of motion;Decreased activity tolerance;Decreased balance;Decreased mobility;Decreased cognition;Decreased knowledge of use of DME;Decreased safety awareness       PT Treatment Interventions DME instruction;Gait training;Stair training;Functional mobility training;Therapeutic activities;Therapeutic exercise;Balance training;Patient/family education    PT Goals (Current goals can be found in the Care Plan section)  Acute Rehab PT Goals Patient Stated Goal: to return home with caregiver PT Goal Formulation: With patient Time For Goal Achievement: 04/14/19 Potential to Achieve Goals: Fair    Frequency Min 2X/week   Barriers to discharge Decreased caregiver support      Co-evaluation               AM-PAC PT "6 Clicks" Mobility  Outcome Measure Help needed turning from your back to your side while in a flat bed without using bedrails?: None Help needed moving from lying on your back to sitting on the side of a flat bed without using bedrails?: None Help needed moving to and from a bed to a chair (including a wheelchair)?: A Little Help needed standing up from a chair using your arms (e.g., wheelchair or bedside  chair)?: A Little Help needed to walk in hospital room?: A Little Help needed climbing 3-5 steps with a railing? : A Lot 6 Click Score: 19    End of Session Equipment Utilized During Treatment: Gait belt Activity Tolerance: Patient tolerated treatment well Patient left: in bed;with call bell/phone within reach;with bed alarm set;with family/visitor present Nurse Communication: Mobility status PT Visit Diagnosis: Muscle weakness (generalized) (M62.81);Difficulty in walking, not elsewhere classified (R26.2)    Time: [id number] PT Time Calculation (min) (ACUTE ONLY): 36 min   Charges:   PT Evaluation $PT Eval Moderate Complexity: 1 Mod PT Treatments $Gait Training: 8-22 mins $Therapeutic Activity: 8-22 mins       Amontae Ng H. Owens Shark, PT, DPT, NCS 03/31/19, 3:43 PM [phone]

## 2019-03-31 NOTE — Progress Notes (Signed)
Called patient's daughter [name] and notified her that patient transferred to room 225.

## 2019-03-31 NOTE — Telephone Encounter (Signed)
I spoke with the patient daughter [name] and instructed her that the hospital should place the necessary order for oxygen at home if they feel like the patient needs oxygen at home. I recommended  that she ask about it when they contact her with discharge instruction. She verbalize understanding, no questions or concerns.

## 2019-03-31 NOTE — Progress Notes (Signed)
CRITICAL CARE PROGRESS NOTE    Name: Aaron Gibbs MRN: [id number] DOB: July 02, 1934     LOS: 2   SUBJECTIVE FINDINGS & SIGNIFICANT EVENTS   Patient description:   This is an 84 y/o caucasian male with a medical history significant for COPD, EtOH abuse,major depressivedisorder with suicidal ideation, anxiety and glaucoma who presented to the ED with progressive dyspnea. Patient was hypoxemic with altered mental status was intubated emergently post failure of BIPAP. Found to be febrile and with Left lung infiltrate on CXR upon admission to MICU.    Lines / Drains: PIV  Cultures / Sepsis markers: Procal trended down to reference range from initial 0.26>.10 MRSA nasal PCR - negative  Antibiotics: Cefepime- (resp cx with GPC in clusters) will switch to Unasyn - appreciate PharmD    Protocols / Consultants: Hospitalist Critical Care  Tests / Events: KUB - no BMX3 days  Overnight: Sundowning -suspect at least mild cognitive impairment secondary to demeentia   PAST MEDICAL HISTORY   Past Medical History:  Diagnosis Date  . Anxiety   . Cancer ([place])    skin  . COPD (chronic obstructive pulmonary disease) ([place])   . Glaucoma   . Osteoporosis    osteopenia     SURGICAL HISTORY   Past Surgical History:  Procedure Laterality Date  . CHOLECYSTECTOMY    . TRANSURETHRAL RESECTION OF PROSTATE N/A 2008     FAMILY HISTORY   Family History  Problem Relation Age of Onset  . Depression Mother   . Heart disease Father      SOCIAL HISTORY   Social History   Tobacco Use  . Smoking status: Former Smoker    Years: 15.00  . Smokeless tobacco: Current User    Types: Chew  . Tobacco comment: stopped 15 years ago  Substance Use Topics  . Alcohol use: Yes    Comment: past  . Drug use: Never      MEDICATIONS   Current Medication:  Current Facility-Administered Medications:  .  acetaminophen (TYLENOL) tablet 650 mg, 650 mg, Oral, Q6H PRN, 650 mg at 03/29/19 0242 **OR** acetaminophen (TYLENOL) suppository 650 mg, 650 mg, Rectal, Q6H PRN, Tukov-Yual, Magdalene S, NP .  bisacodyl (DULCOLAX) suppository 10 mg, 10 mg, Rectal, Daily PRN, Tukov-Yual, Magdalene S, NP .  budesonide (PULMICORT) nebulizer solution 0.25 mg, 0.25 mg, Nebulization, Q12H, Tukov-Yual, Magdalene S, NP, 0.25 mg at 03/31/19 0814 .  ceFEPIme (MAXIPIME) 2 g in sodium chloride 0.9 % 100 mL IVPB, 2 g, Intravenous, Q8H, Ojie, Jude, MD, Last Rate: 200 mL/hr at 03/31/19 0601, 2 g at 03/31/19 0601 .  Chlorhexidine Gluconate Cloth 2 % PADS 6 each, 6 each, Topical, Daily, Lang Snow, NP, 6 each at 03/30/19 1804 .  enoxaparin (LOVENOX) injection 40 mg, 40 mg, Subcutaneous, Q24H, Ouma, Bing Neighbors, NP, 40 mg at 03/31/19 0030 .  famotidine (PEPCID) IVPB 20 mg premix, 20 mg, Intravenous, Q24H, Tukov-Yual, Magdalene S, NP, Last Rate: 100 mL/hr at 03/31/19 0739, 20 mg at 03/31/19 0739 .  folic acid injection 1 mg, 1 mg, Intravenous, Daily, Tukov-Yual, Magdalene S, NP, 1 mg at 03/30/19 0934 .  HYDROmorphone (DILAUDID) injection 1 mg, 1 mg, Intravenous, Q2H PRN, Flora Lipps, MD, 1 mg at 03/31/19 0617 .  ipratropium-albuterol (DUONEB) 0.5-2.5 (3) MG/3ML nebulizer solution 3 mL, 3 mL, Nebulization, Q6H, Kasa, Kurian, MD, 3 mL at 03/31/19 0814 .  methylPREDNISolone sodium succinate (SOLU-MEDROL) 40 mg/mL injection 40 mg, 40 mg, Intravenous, Q12H, Tukov-Yual, Magdalene S, NP, 40  mg at 03/31/19 0556 .  morphine 2 MG/ML injection 2 mg, 2 mg, Intravenous, Once, Flora Lipps, MD .  sennosides (SENOKOT) 8.8 MG/5ML syrup 5 mL, 5 mL, Per Tube, BID PRN, Tukov-Yual, Magdalene S, NP .  sodium chloride flush (NS) 0.9 % injection 10-40 mL, 10-40 mL, Intracatheter, Q12H, Ouma, Bing Neighbors, NP, 10 mL at 03/30/19 2102 .  sodium  chloride flush (NS) 0.9 % injection 10-40 mL, 10-40 mL, Intracatheter, PRN, Ouma, Bing Neighbors, NP .  thiamine (B-1) injection 100 mg, 100 mg, Intravenous, Daily, Tukov-Yual, Magdalene S, NP, 100 mg at 03/30/19 0934    ALLERGIES   Azithromycin    REVIEW OF SYSTEMS    10 Point ROS done and is negative except for anxiety and SOB.   PHYSICAL EXAMINATION   Vital Signs: Temp:  [98 F (36.7 C)-98.9 F (37.2 C)] 98 F (36.7 C) (10/12 0741) Pulse Rate:  [72-105] 89 (10/12 0741) Resp:  [18-25] 18 (10/11 1600) BP: (115-184)/(60-102) 184/91 (10/12 0700) SpO2:  [90 %-100 %] 91 % (10/12 0814) FiO2 (%):  [28 %] 28 % (10/11 1958) Weight:  [107.4 kg] 107.4 kg (10/12 0500)  GENERAL:anxious, chronically ill appearing HEAD: Normocephalic, atraumatic.  EYES: Pupils equal, round, reactive to light.  No scleral icterus.  MOUTH: Moist mucosal membrane. NECK: Supple. No thyromegaly. No nodules. No JVD.  PULMONARY: decreased bs bilateral no R/R/W CARDIOVASCULAR: S1 and S2. Regular rate and rhythm. No murmurs, rubs, or gallops.  GASTROINTESTINAL: Soft, nontender, non-distended. No masses. Positive bowel sounds. No hepatosplenomegaly.  MUSCULOSKELETAL: No swelling, clubbing, or edema.  NEUROLOGIC: Mild distress due to acute illness SKIN:intact,warm,dry   PERTINENT DATA     Infusions: . ceFEPime (MAXIPIME) IV 2 g (03/31/19 0601)  . famotidine (PEPCID) IV 20 mg (03/31/19 0739)   Scheduled Medications: . budesonide (PULMICORT) nebulizer solution  0.25 mg Nebulization Q12H  . Chlorhexidine Gluconate Cloth  6 each Topical Daily  . enoxaparin (LOVENOX) injection  40 mg Subcutaneous Q24H  . folic acid  1 mg Intravenous Daily  . ipratropium-albuterol  3 mL Nebulization Q6H  . methylPREDNISolone (SOLU-MEDROL) injection  40 mg Intravenous Q12H  .  morphine injection  2 mg Intravenous Once  . sodium chloride flush  10-40 mL Intracatheter Q12H  . thiamine injection  100 mg Intravenous Daily    PRN Medications: acetaminophen **OR** acetaminophen, bisacodyl, HYDROmorphone (DILAUDID) injection, sennosides, sodium chloride flush Hemodynamic parameters:   Intake/Output: 10/11 0701 - 10/12 0700 In: 567.2 [I.V.:317.2; IV Piggyback:250] Out: [id number] [Urine:1125]  Ventilator  Settings: Vent Mode: PSV FiO2 (%):  [28 %] 28 % PEEP:  [5 cmH20] 5 cmH20 Pressure Support:  [5 cmH20] 5 cmH20     LAB RESULTS:  Basic Metabolic Panel: Recent Labs  Lab 03/28/19 2132 03/29/19 0221 03/30/19 0324 03/31/19 0603  NA 140 142 142 143  K 4.5 4.5 4.0 3.9  CL 109 113* 111 115*  CO2 19* 21* 19* 23  GLUCOSE 145* 179* 167* 124*  BUN 33* 35* 24* 23  CREATININE 1.21 1.14 0.85 0.69  CALCIUM 9.3 9.3 9.0 9.0  MG  --  2.1 2.2  --   PHOS  --  4.0 3.4  --    Liver Function Tests: Recent Labs  Lab 03/28/19 2132 03/30/19 0324  AST 52* 45*  ALT 45* 56*  ALKPHOS 51 45  BILITOT 0.8 0.7  PROT 7.4 6.6  ALBUMIN 3.4* 3.1*   No results for input(s): LIPASE, AMYLASE in the last 168 hours. No results for input(s): AMMONIA  in the last 168 hours. CBC: Recent Labs  Lab 03/28/19 2132 03/29/19 0221 03/30/19 0324  WBC 7.7 7.1 5.7  NEUTROABS 4.8  --   --   HGB 13.3 13.4 12.1*  HCT 41.7 42.5 39.2  MCV 92.5 93.6 94.7  PLT 179 169 157   Cardiac Enzymes: No results for input(s): CKTOTAL, CKMB, CKMBINDEX, TROPONINI in the last 168 hours. BNP: Invalid input(s): POCBNP CBG: Recent Labs  Lab 03/30/19 0022 03/30/19 0758 03/30/19 1802 03/31/19 0021 03/31/19 0829  GLUCAP 175* 154* 120* 152* 125*     IMAGING RESULTS:  Imaging: Dg Chest Port 1 View  Result Date: 03/30/2019 CLINICAL DATA:  Acute respiratory failure EXAM: PORTABLE CHEST 1 VIEW COMPARISON:  03/29/2019 FINDINGS: Patient rotated rightward. Endotracheal tube unchanged. RIGHT PICC line in good position with tip in distal SVC. Low lung volumes. Patchy bilateral airspace disease similar to comparison exam. IMPRESSION: 1. Stable  support apparatus. 2. A PICC line with tip in distal SVC. 3. Patchy bilateral airspace disease representing edema or pulmonary infection. Electronically Signed   By: Suzy Bouchard M.D.   On: 03/30/2019 08:50      ASSESSMENT AND PLAN    -Multidisciplinary rounds held today  Acute Hypoxic Respiratory Failure   - due to Community acquired pneumonia - Resp culture - gram + cocci in clusters likely staph species    - MRSA pcr nasal negative    - switch abx to Unasyn IV     - Swallow evaluation today due to background of dementia and high risk for aspiration-with language and cognition testing    -liberated from MV yesterday, remains anxious reports difficulty breathing this am and very anxious ,fear of dying   -continue Bronchodilator Therapy -Wean Fio2 and PEEP as tolerated -PT/OT -today for ROM and OOB ambulation with O2 req testing   Altered mental status with lethargy   - possible due to hypoxemia vs pneumonia , complicated by baseline dementia with sundowning and psychotropic medications as per UDS    - improved s/p liberation from MV   - will d/c Cefepime due to risk of drug induced encephalopathy -oxygen as needed -Lasix as tolerated -follow up cardiac enzymes as indicated ICU monitoring    Chronic diastolic CHF with EF 0000000    -  Patient with pneumonia and component of pulmonary edema on CXR    - will gently diurese today -follow chem 7 -follow UO -continue Foley Catheter-assess need daily   Mild-moderate protein calorie malnutrition -due to acute illness with multiple chronic comorbid conditions  - will need official dietary evaluation    Constipation and obstipation    - no BMx3 days , no flatus, no bowel sounds on exam today after prolonged auscultation, abdomen soft non peritoneal    - KUB this am    -pt received narcotics on this admission   NEUROLOGY   - suspect dementia - would benefit from outpatient neurology evaluation  - sundowning overnight    Mild transaminitis   - d/c nonessential hepatotoxic medications    - will stop tylenol and dilaudid today    ID -continue IV abx as prescibed-Unasyn today -follow up cultures  GI/Nutrition GI PROPHYLAXIS as indicated- H2 blockade currently DIET-->TF's as tolerated-swallow evaluation today Constipation protocol as indicated-rpn duclolax suppository - pt has not received on this admission thus far   ENDO - ICU hypoglycemic\Hyperglycemia protocol -check FSBS per protocol   ELECTROLYTES -follow labs as needed -replace as needed -pharmacy consultation   DVT/GI PRX ordered -SCDs  TRANSFUSIONS AS NEEDED MONITOR FSBS ASSESS the need for LABS as needed   Critical care provider statement:    Critical care time (minutes):  109   Critical care time was exclusive of:  Separately billable procedures and treating other patients   Critical care was necessary to treat or prevent imminent or life-threatening deterioration of the following conditions:  Pneumonia, acute hypoxemic respiratory failure, altered mental status, acute copd exacerbation, possible ilius, transaminitis, multiple comorbid conditions   Critical care was time spent personally by me on the following activities:  Development of treatment plan with patient or surrogate, discussions with consultants, evaluation of patient's response to treatment, examination of patient, obtaining history from patient or surrogate, ordering and performing treatments and interventions, ordering and review of laboratory studies and re-evaluation of patient's condition.  I assumed direction of critical care for this patient from another provider in my specialty: no    This document was prepared using Dragon voice recognition software and may include unintentional dictation errors.    Ottie Glazier, M.D.  Division of [place]

## 2019-03-31 NOTE — Progress Notes (Signed)
Aaron Gibbs at [place] NAME: Aaron Gibbs    MR#:  [id number]  DATE OF BIRTH:  1935/05/03  SUBJECTIVE:  CHIEF COMPLAINT:   Chief Complaint  Patient presents with  . Shortness of Breath   Patient currently sedated on the vent.  Family; daughter at bedside, intensivist at bedside updating family.  REVIEW OF SYSTEMS:  ROS unobtainable due to patient being sedated on the vent  DRUG ALLERGIES:   Allergies  Allergen Reactions  . Azithromycin Shortness Of Breath   VITALS:  Blood pressure (!) 158/80, pulse 82, temperature 98 F (36.7 C), temperature source Axillary, resp. rate 18, height 5\' 10"  (1.778 m), weight 107.4 kg, SpO2 94 %. PHYSICAL EXAMINATION:  Physical Exam  GENERAL:  84 y.o.-year-old patient lying in the bed intubated and sedated. Diaphoretic. EYES: Pupils equal, round, reactive to light and accommodation. No scleral icterus.  HEENT: Head atraumatic, normocephalic. Oropharynx and nasopharynx clear.  ET tube in place NECK: Supple, no jugular venous distention. No thyroid enlargement, no tenderness.  LUNGS: Decreased breath sounds bilaterally, mild wheezing, rales,rhonchi or crepitation. Moderate use of accessory muscles of respiration.  CARDIOVASCULAR: S1, S2 normal. No murmurs, rubs, or gallops.  ABDOMEN: Soft, nontender, nondistended. Bowel sounds present. No organomegaly or mass.  EXTREMITIES: No pedal edema, cyanosis, or clubbing.  NEUROLOGIC: Patient is currently intubated and sedated unable to assess muscle strength..  Gait not checked.  PSYCHIATRIC: Unable to assess patient is currently intubated and sedated SKIN: No obvious rash, lesion, or ulcer.    LABORATORY PANEL:  Male CBC Recent Labs  Lab 03/30/19 0324  WBC 5.7  HGB 12.1*  HCT 39.2  PLT 157   ------------------------------------------------------------------------------------------------------------------ Chemistries  Recent Labs  Lab 03/30/19  0324 03/31/19 0603  NA 142 143  K 4.0 3.9  CL 111 115*  CO2 19* 23  GLUCOSE 167* 124*  BUN 24* 23  CREATININE 0.85 0.69  CALCIUM 9.0 9.0  MG 2.2  --   AST 45*  --   ALT 56*  --   ALKPHOS 45  --   BILITOT 0.7  --    RADIOLOGY:  Dg Abd 1 View  Result Date: 03/31/2019 CLINICAL DATA:  Ileus. EXAM: ABDOMEN - 1 VIEW COMPARISON:  One-view chest x-ray 03/30/2019. FINDINGS: Heart size is normal.  Bibasilar airspace opacities are again seen. Bowel gas pattern is unremarkable. There is no obstruction or free air. Surgical clips are present at the gallbladder fossa. Ankylosing spondylitis is again noted. IMPRESSION: 1. Normal bowel gas pattern. 2. Bibasilar airspace disease. Electronically Signed   By: San Morelle M.D.   On: 03/31/2019 09:21   ASSESSMENT AND PLAN:   84 y.o. male with pertinent past medical history of COPD, EtOH abuse,major depressive disorder with suicidal ideation, anxiety and glaucoma presenting with increased confusion, shortness of breath, dizziness, falls and diaphoresis.  1. Acute on chronic respiratory failure with hypoxemia -likely secondary to COPD exacerbation and pneumonia - Admitted to ICU - Patient was initially intubated.  Was weaned off the ventilator and extubated on 03/30/2019.   Continue bronchodilator therapy.  Continue antibiotics.   Anticipate transfer out of ICU once okay by intensivist.   Patient had CT head done which revealed generalized atrophy and chronic microvascular ischemia without acute intracranial abnormality. - Management per ICU team  2.  Left lobar pneumonia - Chest x-ray shows extensive consolidative opacity throughout the left lung concerning for pneumonia.  COVID test negative. Continue broad-spectrum IV antibiotics with vancomycin  and cefepime.  Follow-up on cultures  3. Acute COPD Exacerbation secondary to pneumonia? - On mechanical ventilation - Bronchodilators (albuterol/ipratropium) standing and PRN - IV  Solu-Medrol - Antibiotics as above  4. EtOH abuse - ? Last Drink 2 months ago per daughter - Monitor for EtOH Withdrawal.  Continue thiamine, folic acid.  Was also given multivitamin Patient currently sedated on the vent.   DVT prophylaxis - Enoxaparin   All the records are reviewed    CODE STATUS: Full Code  TOTAL TIME TAKING CARE OF THIS PATIENT: 30 minutes.   More than 50% of the time was spent in counseling/coordination of care: YES  POSSIBLE D/C IN 3 DAYS, DEPENDING ON CLINICAL CONDITION.   Aaron Gibbs M.D on 03/31/2019 at 4:03 PM  Between 7am to 6pm - Pager - [phone]  After 6pm go to [email or web address] - Proofreader  [facility]  Hospitalists  Office  [phone]  CC: Primary care physician; Olin Hauser, DO  Note: This dictation was prepared with Dragon dictation along with smaller phrase technology. Any transcriptional errors that result from this process are unintentional.

## 2019-03-31 NOTE — Progress Notes (Signed)
Pt was restless and c/o left eye hurting. So timolol was administered per order.

## 2019-04-01 ENCOUNTER — Other Ambulatory Visit: Payer: Self-pay

## 2019-04-01 LAB — CULTURE, RESPIRATORY W GRAM STAIN: Culture: NORMAL

## 2019-04-01 LAB — BASIC METABOLIC PANEL
Anion gap: 9 (ref 5–15)
BUN: 22 mg/dL (ref 8–23)
CO2: 23 mmol/L (ref 22–32)
Calcium: 9.2 mg/dL (ref 8.9–10.3)
Chloride: 106 mmol/L (ref 98–111)
Creatinine, Ser: 0.8 mg/dL (ref 0.61–1.24)
GFR calc Af Amer: >60 mL/min (ref >60)
GFR calc non Af Amer: >60 mL/min (ref >60)
Glucose, Bld: 187 mg/dL — ABNORMAL HIGH (ref 70–99)
Potassium: 3.8 mmol/L (ref 3.5–5.1)
Sodium: 138 mmol/L (ref 135–145)

## 2019-04-01 LAB — GLUCOSE, CAPILLARY
Glucose-Capillary: 100 mg/dL — ABNORMAL HIGH (ref 70–99)
Glucose-Capillary: 112 mg/dL — ABNORMAL HIGH (ref 70–99)
Glucose-Capillary: 88 mg/dL (ref 70–99)

## 2019-04-01 LAB — MAGNESIUM: Magnesium: 2 mg/dL (ref 1.7–2.4)

## 2019-04-01 MED ORDER — IPRATROPIUM-ALBUTEROL 0.5-2.5 (3) MG/3ML IN SOLN
3.0000 mL | Freq: Two times a day (BID) | RESPIRATORY_TRACT | Status: DC
  Administered 2019-04-01 – 2019-04-02 (×2): 3 mL via RESPIRATORY_TRACT
  Filled 2019-04-01 (×2): qty 3

## 2019-04-01 MED ORDER — QUETIAPINE FUMARATE 25 MG PO TABS
25.0000 mg | ORAL_TABLET | Freq: Once | ORAL | Status: AC
  Administered 2019-04-01: 25 mg via ORAL
  Filled 2019-04-01: qty 1

## 2019-04-01 MED ORDER — OLANZAPINE 5 MG PO TBDP
2.5000 mg | ORAL_TABLET | Freq: Four times a day (QID) | ORAL | Status: DC | PRN
  Administered 2019-04-01 (×2): 2.5 mg via ORAL
  Filled 2019-04-01 (×3): qty 0.5

## 2019-04-01 MED ORDER — LORAZEPAM 2 MG/ML IJ SOLN
1.0000 mg | Freq: Once | INTRAMUSCULAR | Status: AC
  Administered 2019-04-01: 1 mg via INTRAVENOUS
  Filled 2019-04-01: qty 1

## 2019-04-01 MED ORDER — METHYLPREDNISOLONE SODIUM SUCC 40 MG IJ SOLR
20.0000 mg | Freq: Two times a day (BID) | INTRAMUSCULAR | Status: DC
  Administered 2019-04-01 – 2019-04-02 (×2): 20 mg via INTRAVENOUS
  Filled 2019-04-01 (×2): qty 1

## 2019-04-01 MED ORDER — ZIPRASIDONE MESYLATE 20 MG IM SOLR
10.0000 mg | Freq: Four times a day (QID) | INTRAMUSCULAR | Status: DC | PRN
  Administered 2019-04-01 (×3): 10 mg via INTRAMUSCULAR
  Filled 2019-04-01 (×5): qty 20

## 2019-04-01 MED ORDER — HYDROMORPHONE HCL 1 MG/ML IJ SOLN
0.5000 mg | Freq: Once | INTRAMUSCULAR | Status: AC
  Administered 2019-04-01: 0.5 mg via INTRAVENOUS
  Filled 2019-04-01: qty 0.5

## 2019-04-01 MED ORDER — ALBUTEROL SULFATE (2.5 MG/3ML) 0.083% IN NEBU: 2.5000 mg | INHALATION_SOLUTION | RESPIRATORY_TRACT | Status: DC | PRN

## 2019-04-01 MED ORDER — TIMOLOL MALEATE 0.5 % OP SOLN
1.0000 [drp] | Freq: Every day | OPHTHALMIC | Status: DC
  Administered 2019-04-02: 09:00:00 1 [drp] via OPHTHALMIC
  Filled 2019-04-01: qty 5

## 2019-04-01 NOTE — Progress Notes (Signed)
[place] [facility] [place] NAME: Aaron Gibbs    MR#:  [id number]  DATE OF BIRTH:  June 06, 1935  SUBJECTIVE:  CHIEF COMPLAINT:   Chief Complaint  Patient presents with  . Shortness of Breath   Patient extubated and subsequently transferred out of ICU yesterday.  Nursing staff reports patient with intermittent confusion and agitation.  Requiring one-to-one sitter for patient safety.  Patient denies any complaint this morning.  REVIEW OF SYSTEMS:  Review of Systems  Constitutional: Negative for chills and fever.  HENT: Negative for hearing loss and tinnitus.   Eyes: Negative for blurred vision and double vision.  Respiratory: Negative for cough and sputum production.   Cardiovascular: Negative for chest pain and palpitations.  Genitourinary: Negative for dysuria and urgency.  Musculoskeletal: Negative for myalgias and neck pain.  Skin: Negative for itching and rash.  Neurological: Negative for dizziness.       Generalized weakness.  Intermittent episodes of confusion.  Psychiatric/Behavioral: Negative for depression.       Intermittent episodes of confusion      DRUG ALLERGIES:   Allergies  Allergen Reactions  . Azithromycin Shortness Of Breath   VITALS:  Blood pressure (!) 153/74, pulse 86, temperature 98.4 F (36.9 C), temperature source Oral, resp. rate 18, height 5\' 10"  (1.778 m), weight 107.4 kg, SpO2 95 %. PHYSICAL EXAMINATION:  Physical Exam  GENERAL:  84 y.o.-year-old patient.  Not in any distress. EYES: Pupils equal, round, reactive to light and accommodation. No scleral icterus.  HEENT: Head atraumatic, normocephalic. Oropharynx and nasopharynx clear.   NECK: Supple, no jugular venous distention. No thyroid enlargement, no tenderness.  LUNGS: Decreased breath sounds bilaterally, mild wheezing, rales,rhonchi or crepitation. Moderate use of accessory muscles of respiration.  CARDIOVASCULAR: S1, S2 normal. No murmurs,  rubs, or gallops.  ABDOMEN: Soft, nontender, nondistended. Bowel sounds present. No organomegaly or mass.  EXTREMITIES: No pedal edema, cyanosis, or clubbing.  NEUROLOGIC: Awake and alert and oriented.  Moving all extremities.  Generalized weakness.  Gait not checked.  PSYCHIATRIC:  Awake and alert and oriented. SKIN: No obvious rash, lesion, or ulcer.    LABORATORY PANEL:  Male CBC Recent Labs  Lab 03/30/19 0324  WBC 5.7  HGB 12.1*  HCT 39.2  PLT 157   ------------------------------------------------------------------------------------------------------------------ Chemistries  Recent Labs  Lab 03/30/19 0324  04/01/19 1043  NA 142   < > 138  K 4.0   < > 3.8  CL 111   < > 106  CO2 19*   < > 23  GLUCOSE 167*   < > 187*  BUN 24*   < > 22  CREATININE 0.85   < > 0.80  CALCIUM 9.0   < > 9.2  MG 2.2  --  2.0  AST 45*  --   --   ALT 56*  --   --   ALKPHOS 45  --   --   BILITOT 0.7  --   --    < > = values in this interval not displayed.   RADIOLOGY:  No results found. ASSESSMENT AND PLAN:   84 y.o. male with pertinent past medical history of COPD, EtOH abuse,major depressive disorder with suicidal ideation, anxiety and glaucoma presenting with increased confusion, shortness of breath, dizziness, falls and diaphoresis.  1. Acute on chronic respiratory failure with hypoxemia -likely secondary to COPD exacerbation and pneumonia - Admitted to ICU initially - Patient was initially intubated.  Was weaned off the  ventilator and extubated on 03/30/2019 and transferred out of ICU.Marland Kitchen   Continue bronchodilator therapy.  Continue antibiotics.   Patient had CT head done which revealed generalized atrophy and chronic microvascular ischemia without acute intracranial abnormality.  2.  Left lobar pneumonia - Chest x-ray shows extensive consolidative opacity throughout the left lung concerning for pneumonia.  COVID test negative. Patient was initially on IV vancomycin and cefepime.   Previously escalated to IV Unasyn which patient is currently on at this time.  3. Acute COPD Exacerbation secondary to pneumonia? - Window of the event and doing well status post extubation. - Bronchodilators (albuterol/ipratropium) standing and PRN - IV Solu-Medrol with gradual taper - Antibiotics as above  4. EtOH abuse - ? Last Drink 2 months ago per daughter - Monitor for EtOH Withdrawal.  Continue thiamine, folic acid.  Was also given multivitamin Patient currently sedated on the vent. Physical therapy to evaluate and treat   DVT prophylaxis - Enoxaparin   All the records are reviewed    CODE STATUS: Full Code  TOTAL TIME TAKING CARE OF THIS PATIENT: 32 minutes.   More than 50% of the time was spent in counseling/coordination of care: YES  POSSIBLE D/C IN 2 DAYS, DEPENDING ON CLINICAL CONDITION.   Eldo Umanzor M.D on 04/01/2019 at 3:41 PM  Between 7am to 6pm - Pager - [phone]  After 6pm go to [email or web address] - Proofreader  [facility]  Office  [phone]  CC: Primary care physician; Olin Hauser, DO  Note: This dictation was prepared with Dragon dictation along with smaller phrase technology. Any transcriptional errors that result from this process are unintentional.

## 2019-04-01 NOTE — Care Management Important Message (Signed)
Important Message  Patient Details  Name: Aaron Gibbs MRN: [id number] Date of Birth: Aug 16, 1934   Medicare Important Message Given:  Yes  Initial Medicare IM given by Patient Access Associate on 03/31/2019 at 9:02am.   Dannette Barbara 04/01/2019, 8:17 AM

## 2019-04-01 NOTE — TOC Initial Note (Signed)
Transition of Care [facility]) - Initial/Assessment Note    Patient Details  Name: Aaron Gibbs MRN: [id number] Date of Birth: 10-Aug-1934  Transition of Care [facility]) CM/SW Contact:    Beverly Sessions, RN Phone Number: 04/01/2019, 2:22 PM  Clinical Narrative:                 Patient admitted from home with PNA Assessment completed via phone with daughter [name].    Patient resides in his own apartment.  Patient has private duty caregiver Abigail Butts) who is there Monday - Friday 9am-2pm.  After discharge they will be extending [employer] hours. Daughter states goal is for them to get a home, and Abigail Butts to move in an provide 24 hour care  Daughter is trying to make arrangements for additional Private pay care to cover evening hours and weekend. RNCM emailed daughter [facility] list.   PT has assessed patient and recommends home health PT with 24 hour supervision.  Daughter states that she lives 1 block for patient and will be available for additional support if needed. Daughter agreeable to home health services.  States she does not have a preference of agency, but "the last one we used was fine".  Per chart review patient had referral to [facility] in December 2019.  Referral made to [facility] to see if they can accept. Pt has RW and cane in the home     Expected Discharge Plan: [place] Barriers to Discharge: Continued Medical Work up   Patient Goals and CMS Choice Patient states their goals for this hospitalization and ongoing recovery are:: to get additional services at home CMS Medicare.gov Compare Post Acute Care list provided to:: Patient Represenative (must comment)(daughter) Choice offered to / list presented to : Adult Children  Expected Discharge Plan and Services Expected Discharge Plan: [place]   Discharge Planning Services: CM Consult Post Acute Care Choice: [place] arrangements for the past 2 months:  Apartment                                      Prior Living Arrangements/Services Living arrangements for the past 2 months: Apartment Lives with:: Other (Comment) Patient language and need for interpreter reviewed:: Yes Do you feel safe going back to the place where you live?: Yes      Need for Family Participation in Patient Care: Yes (Comment) Care giver support system in place?: Yes (comment) Current home services: DME Criminal Activity/Legal Involvement Pertinent to Current Situation/Hospitalization: No - Comment as needed  Activities of Daily Living      Permission Sought/Granted                  Emotional Assessment       Orientation: : Oriented to Self, Oriented to Place Alcohol / Substance Use: Alcohol Use Psych Involvement: No (comment)  Admission diagnosis:  Acute on chronic respiratory failure, unspecified whether with hypoxia or hypercapnia ([place]) [J96.20] Patient Active Problem List   Diagnosis Date Noted  . Pneumonia 03/29/2019  . Acute meniscal tear of knee, left, sequela 03/03/2019  . Palpitation 02/13/2019  . Preop testing 11/14/2018  . Noncompliance with medication treatment due to overuse of medication 10/23/2018  . Neurogenic pain 08/20/2018  . Atherosclerotic peripheral vascular disease ([place]) 07/24/2018  . Tricompartment osteoarthritis of knee (Left) 07/24/2018  . Osteoarthritis of knee (Bilateral) 07/24/2018  .  Osteoarthritis of patellofemoral joint (Right) 07/24/2018  . History of suicide attempt (06/12/18) 07/24/2018  . Long term current use of opiate analgesic 07/10/2018  . Pharmacologic therapy 07/10/2018  . Disorder of skeletal system 07/10/2018  . Problems influencing health status 07/10/2018  . Suicide attempt ([place]) (05/13/18) 06/26/2018  . Syncope and collapse 06/04/2018  . 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 Major depressive disorder, recurrent episode, severe ([place]) 06/04/2018  . Blindness 05/10/2018  . Suicidal  ideation 05/10/2018  . Chronic pain syndrome 05/01/2018  . DISH (diffuse idiopathic skeletal hyperostosis) 05/01/2018  . Osteoarthritis of multiple joints 05/01/2018  . Chronic knee pain (Primary Area of Pain) (Bilateral) (L>R) 05/01/2018  . Retinitis pigmentosa of both eyes 05/01/2018  . Glaucoma of both eyes 05/01/2018  . Essential hypertension 05/01/2018  . Chronic low back pain (Bilateral) w/o sciatica 05/01/2018  . GERD (gastroesophageal reflux disease) 05/01/2018  . 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 Therapeutic opioid-induced constipation (OIC) 05/01/2018  . Centrilobular emphysema ([place]) 05/01/2018   PCP:  Olin Hauser, DO Pharmacy:   [place] [facility], [place] [place] [address] Phone: [phone] Fax: [phone]     Social Determinants of Health (SDOH) Interventions    Readmission Risk Interventions No flowsheet data found.

## 2019-04-01 NOTE — Progress Notes (Signed)
PT Cancellation Note  Patient Details Name: Aaron Gibbs MRN: [id number] DOB: 1934/09/15   Cancelled Treatment:    Reason Eval/Treat Not Completed: Fatigue/lethargy limiting ability to participate(Treatment session attempted.  Patient generally lethargic (medication related) and unable to maintain alertness for participation with session.  Will re-attempt next date as medically appropriate and available.)   Ammar Moffatt H. Owens Shark, PT, DPT, NCS 04/01/19, 3:51 PM [phone]

## 2019-04-01 NOTE — Progress Notes (Signed)
Speech Language Pathology Treatment: Dysphagia  Patient Details Name: Aaron Gibbs MRN: [id number] DOB: 1935/02/13 Today's Date: 04/01/2019 Time: [id number] SLP Time Calculation (min) (ACUTE ONLY): 35 min  Assessment / Plan / Recommendation Clinical Impression  Pt seen for ongoing assessment of swallowing function; appropriateness for trials to upgrade diet. Pt has done well tolerating the recommended modified diet (puree w/ thin liquids) so far; Sitter stated pt ate "well" at breakfast this morning eating "most of the meal"; less at lunch. Pt is having some agitation and confusion; medications have been given for anxiety and agitation per the Sitter and NSG. Pt appeared to present w/ adequate oropharyngeal phase swallowing function w/ the thin liquid trials given, but noted the pt was too distracted and drowsy for continued trials. Overt coughing noted x1 when he took too large a sip via Cup, multiple swallows followed then a strong cough. Pt consumed a few more trials of thin liquids via Straw w/ strict monitoring by SLP w/ no further overt s/s of aspiration noted. No trials of solid foods were given d/t declined Cognitive status and significant drowsiness at this time -- NSG and Sitter reported good intake w/ the modified diet which is the goal. Pt required Mod. verbal/tactile cues for follow through w/ aspiration precautions during the po trials and was given frequent reminders to slow down w/ oral intake --- suspect impact from baseline Cognitive decline. Pt does present w/ increased risk for aspiration w/ oral intake at this time d/t drowsiness; Supervision during all oral intake and holding po's if too sleepy is recommended. Continue to note a Slight R labial decline in tone(R corner of mouth) -- no anterior spillage or leakage noted. Speech clear, intelligible but noted paraphasias and mumbled speech noted intermittently. Pt required full feeding Supervision and support d/t weakness and  Cognitive decline(Impulsiveness).  Recommend continue a dysphagia level 1 (Puree) diet w/ Thin consistency liquids via Cup; aspiration precautions; Pills in Puree for safer swallowing. ST services to f/u w/ pt's toleration of diet, trials to upgrade as pt's medical and Cognitive status' improve. MD/NSG updated on evaluation findings.     HPI HPI: Pt is a 84 y.o. male with pertinent past medical history of COPD, ETOH use/abuse, major depressive disorder with suicidal ideation, anxiety, obesity, and glaucoma/Vision deficits in R eye per report presenting to the ED with chief complaints of confusion, dizziness, shortness of breath, falls and diaphoresis.  In the ED, he was noted to be very short of breath satting 89% on room air. There were no focal neurological deficits; he was alert but disoriented and agitated at staff.  He was also noted to be diaphoretic and using his accessory muscles of respiration.  He was tachypneic and somewhat tachycardic therefore was placed on nonrebreather with oxygen saturation improvement to 99% however he continued to remain agitated and pulling off the nonrebreather.  He was placed on BiPAP continue to be agitated, anxious, and diaphoretic on the BiPAP.  He was given Ativan IV with no improvement minimal improvement.  Due to his impending respiratory failure, worsening mental status and work of breathing despite treatment with BiPAP and constant verbal redirection, he was intubated for airway protection.  Pt was extubated on 03/30/2019 and is currently on Room Air.       SLP Plan  Continue with current plan of care       Recommendations  Diet recommendations: Dysphagia 1 (puree);Thin liquid Liquids provided via: Cup;Straw(monitor) Medication Administration: Whole meds with puree(or Crushed if needed for  safer swallowing) Supervision: Staff to assist with self feeding;Full supervision/cueing for compensatory strategies Compensations: Minimize environmental  distractions;Slow rate;Small sips/bites;Lingual sweep for clearance of pocketing;Multiple dry swallows after each bite/sip;Follow solids with liquid Postural Changes and/or Swallow Maneuvers: Seated upright 90 degrees;Upright 30-60 min after meal                General recommendations: (Dietician f/u) Oral Care Recommendations: Oral care BID;Staff/trained caregiver to provide oral care Follow up Recommendations: Skilled Nursing facility(TBD) SLP Visit Diagnosis: Dysphagia, oropharyngeal phase (R13.12)(declined Cognitive status) Plan: Continue with current plan of care       Halma, [place], CCC-SLP [name] 04/01/2019, 4:24 PM

## 2019-04-02 LAB — BASIC METABOLIC PANEL
Anion gap: 11 (ref 5–15)
BUN: 20 mg/dL (ref 8–23)
CO2: 24 mmol/L (ref 22–32)
Calcium: 9.1 mg/dL (ref 8.9–10.3)
Chloride: 107 mmol/L (ref 98–111)
Creatinine, Ser: 0.67 mg/dL (ref 0.61–1.24)
GFR calc Af Amer: >60 mL/min (ref >60)
GFR calc non Af Amer: >60 mL/min (ref >60)
Glucose, Bld: 122 mg/dL — ABNORMAL HIGH (ref 70–99)
Potassium: 3.5 mmol/L (ref 3.5–5.1)
Sodium: 142 mmol/L (ref 135–145)

## 2019-04-02 LAB — MAGNESIUM: Magnesium: 2.3 mg/dL (ref 1.7–2.4)

## 2019-04-02 MED ORDER — PREDNISONE 50 MG PO TABS: 50.0000 mg | ORAL_TABLET | Freq: Every day | ORAL | 0 refills | Status: AC

## 2019-04-02 MED ORDER — PREDNISONE 50 MG PO TABS
50.0000 mg | ORAL_TABLET | Freq: Every day | ORAL | Status: DC
  Administered 2019-04-02: 09:00:00 50 mg via ORAL
  Filled 2019-04-02: qty 1

## 2019-04-02 MED ORDER — BUPROPION HCL 75 MG PO TABS
150.0000 mg | ORAL_TABLET | Freq: Two times a day (BID) | ORAL | Status: DC
  Administered 2019-04-02: 09:00:00 150 mg via ORAL
  Filled 2019-04-02 (×2): qty 2

## 2019-04-02 MED ORDER — AMOXICILLIN-POT CLAVULANATE 875-125 MG PO TABS: 1.0000 | ORAL_TABLET | Freq: Two times a day (BID) | ORAL | 0 refills | Status: AC

## 2019-04-02 MED ORDER — AMLODIPINE BESYLATE 5 MG PO TABS
5.0000 mg | ORAL_TABLET | Freq: Every day | ORAL | Status: DC
  Administered 2019-04-02: 09:00:00 5 mg via ORAL
  Filled 2019-04-02: qty 1

## 2019-04-02 MED ORDER — DULOXETINE HCL 30 MG PO CPEP
60.0000 mg | ORAL_CAPSULE | Freq: Every day | ORAL | Status: DC
  Administered 2019-04-02: 60 mg via ORAL

## 2019-04-02 NOTE — Progress Notes (Signed)
Aaron Gibbs  A and O x 3. VSS. Pt tolerating diet well. No complaints of pain or nausea. IV removed intact, prescriptions given to caregiver wendy. Caregiver voiced understanding of discharge instructions with no further questions. Pt discharged via wheelchair with NT.     Allergies as of 04/02/2019      Reactions   Azithromycin Shortness Of Breath      Medication List    TAKE these medications   albuterol 108 (90 Base) MCG/ACT inhaler Commonly known as: VENTOLIN HFA Inhale 1-2 puffs into the lungs every 6 (six) hours as needed for wheezing or shortness of breath.   amLODipine 10 MG tablet Commonly known as: NORVASC Take 10 mg by mouth daily.   amoxicillin-clavulanate 875-125 MG tablet Commonly known as: Augmentin Take 1 tablet by mouth 2 (two) times daily for 5 days.   Anoro Ellipta 62.5-25 MCG/INH Aepb Generic drug: umeclidinium-vilanterol Inhale 1 puff into the lungs daily.   aspirin EC 81 MG tablet Take 81 mg by mouth daily.   buPROPion 150 MG 12 hr tablet Commonly known as: WELLBUTRIN SR Take 150 mg by mouth 2 (two) times daily.   DULoxetine 60 MG capsule Commonly known as: CYMBALTA Take 60 mg by mouth daily.   fenofibrate 145 MG tablet Commonly known as: TRICOR Take 145 mg by mouth daily.   ferrous sulfate 325 (65 FE) MG tablet Take 325 mg by mouth daily with breakfast.   LORazepam 0.5 MG tablet Commonly known as: ATIVAN Take 1 tablet (0.5 mg total) by mouth daily as needed for anxiety.   meloxicam 15 MG tablet Commonly known as: MOBIC Take 1 tablet (15 mg total) by mouth daily.   multivitamin with minerals Tabs tablet Take 1 tablet by mouth daily.   Oxycodone HCl 10 MG Tabs Take 1 tablet (10 mg total) by mouth 3 (three) times daily as needed. Must last 30 days.   pantoprazole 40 MG tablet Commonly known as: PROTONIX Take 40 mg by mouth daily.   prednisoLONE acetate 1 % ophthalmic suspension Commonly known as: PRED FORTE Place 2 drops into  the left eye daily.   predniSONE 50 MG tablet Commonly known as: DELTASONE Take 1 tablet (50 mg total) by mouth daily with breakfast for 4 days.   pregabalin 150 MG capsule Commonly known as: LYRICA Take 1 capsule (150 mg total) by mouth 3 (three) times daily. Must last 30 days   QUEtiapine 100 MG tablet Commonly known as: SEROQUEL Take 100 mg by mouth at bedtime.   timolol 0.25 % ophthalmic solution Commonly known as: BETIMOL 1-2 drops 2 (two) times daily.   timolol 0.5 % ophthalmic solution Commonly known as: TIMOPTIC   traZODone 50 MG tablet Commonly known as: DESYREL       Vitals:   04/02/19 0835 04/02/19 0920  BP:  (!) 156/81  Pulse:    Resp:    Temp:    SpO2: 95%     Aaron Gibbs

## 2019-04-02 NOTE — Progress Notes (Addendum)
Physical Therapy Treatment Patient Details Name: Aaron Gibbs MRN: [id number] DOB: [birth year] Today's Date: 04/02/2019    History of Present Illness presented to ER secondary to AMS, dizziness, falls and SOB; admitted for acute/chronic respiratory failure with hypoxemia due to COPD exacerbation, CAP.  Hospital course significant for intubation 10/9-10/11; now weaned to RA.    PT Comments    In recliner with sitter, excited to walk.  Stood and walked 2 laps then an additional 1 lap after seated rest.  No LOB or buckling but some occasional cues for re-direction and to avoid obstacles.    Pt encouraged to use his RW upon discharge and wait to transition to [facility] per HHPT recommendations.    Follow Up Recommendations  Home health PT;Supervision/Assistance - 24 hour     Equipment Recommendations  (has RW)    Recommendations for Other Services       Precautions / Restrictions Precautions Precaution Comments: visual deficits-legally blind in L eye, and "doesn't see well from R eye"    Mobility  Bed Mobility               General bed mobility comments: in recliner  Transfers Overall transfer level: Needs assistance Equipment used: Rolling walker (2 wheeled) Transfers: Sit to/from Stand Sit to Stand: Min guard;Min assist            Ambulation/Gait Ambulation/Gait assistance: Min guard;Min assist Gait Distance (Feet): 360 Feet Assistive device: Rolling walker (2 wheeled) Gait Pattern/deviations: Step-through pattern;Decreased step length - right;Decreased step length - left;Trunk flexed Gait velocity: decreased   General Gait Details: 2 laps, seated rest, then 1 additional lap   Stairs             Wheelchair Mobility    Modified Rankin (Stroke Patients Only)       Balance Overall balance assessment: Needs assistance Sitting-balance support: No upper extremity supported;Feet supported Sitting balance-Leahy Scale: Good     Standing balance  support: Bilateral upper extremity supported Standing balance-Leahy Scale: Fair                              Cognition Arousal/Alertness: Awake/alert Behavior During Therapy: WFL for tasks assessed/performed Overall Cognitive Status: History of cognitive impairments - at baseline                                 General Comments: oriented to self only      Exercises      General Comments        Pertinent Vitals/Pain Pain Assessment: No/denies pain    Home Living                      Prior Function            PT Goals (current goals can now be found in the care plan section) Progress towards PT goals: Progressing toward goals    Frequency    Min 2X/week      PT Plan      Co-evaluation              AM-PAC PT "6 Clicks" Mobility   Outcome Measure  Help needed turning from your back to your side while in a flat bed without using bedrails?: None Help needed moving from lying on your back to sitting on the side of a flat bed without using bedrails?: None  Help needed moving to and from a bed to a chair (including a wheelchair)?: A Little Help needed standing up from a chair using your arms (e.g., wheelchair or bedside chair)?: A Little Help needed to walk in hospital room?: A Little Help needed climbing 3-5 steps with a railing? : A Lot 6 Click Score: 19    End of Session Equipment Utilized During Treatment: Gait belt Activity Tolerance: Patient tolerated treatment well Patient left: with call bell/phone within reach;in chair;with nursing/sitter in room Nurse Communication: Mobility status       Time: [id number] PT Time Calculation (min) (ACUTE ONLY): 23 min  Charges:  $Gait Training: 8-22 mins                     Chesley Noon, PTA 04/02/19, 10:35 AM

## 2019-04-02 NOTE — Discharge Summary (Signed)
[facility] [place] NAME: Aaron Gibbs    MR#:  [id number]  DATE OF BIRTH:  08-31-1934  DATE OF ADMISSION:  03/28/2019 ADMITTING PHYSICIAN: Lang Snow, NP  DATE OF DISCHARGE: 04/02/2019  PRIMARY CARE PHYSICIAN: Olin Hauser, DO    ADMISSION DIAGNOSIS:  Acute on chronic respiratory failure, unspecified whether with hypoxia or hypercapnia ([place]) [J96.20]  DISCHARGE DIAGNOSIS:  Active Problems:   Pneumonia   SECONDARY DIAGNOSIS:   Past Medical History:  Diagnosis Date  . Anxiety   . Cancer ([place])    skin  . COPD (chronic obstructive pulmonary disease) ([place])   . Glaucoma   . Osteoporosis    osteopenia    HOSPITAL COURSE:  84 year old male with history of COPD who presented to the emergency room due to shortness of breath.  1.  Acute on chronic hypoxic respiratory failure due to COPD exacerbation and pneumonia: Patient was initially intubated and now extubated.  Patient is doing well.  2.  Left lower lobe pneumonia: Patient was initially placed on broad-spectrum antibiotics and transitioned to Unasyn.  Patient will be discharged on oral Augmentin to complete course of treatment.  3.  Acute COPD exacerbation: Patient has no wheezing on day of discharge.  He will continue prednisone for 4 more days.  He will continue inhalers. 4.  Essential hypertension: Continue Norvasc  5.  Anxiety/depression continue Cymbalta, Wellbutrin, trazodone  DISCHARGE CONDITIONS AND DIET:  Stable Regular diet  CONSULTS OBTAINED:    DRUG ALLERGIES:   Allergies  Allergen Reactions  . Azithromycin Shortness Of Breath    DISCHARGE MEDICATIONS:   Allergies as of 04/02/2019      Reactions   Azithromycin Shortness Of Breath      Medication List    TAKE these medications   albuterol 108 (90 Base) MCG/ACT inhaler Commonly known as: VENTOLIN HFA Inhale 1-2 puffs into the lungs every 6 (six) hours as needed for  wheezing or shortness of breath.   amLODipine 10 MG tablet Commonly known as: NORVASC Take 10 mg by mouth daily.   amoxicillin-clavulanate 875-125 MG tablet Commonly known as: Augmentin Take 1 tablet by mouth 2 (two) times daily for 5 days.   Anoro Ellipta 62.5-25 MCG/INH Aepb Generic drug: umeclidinium-vilanterol Inhale 1 puff into the lungs daily.   aspirin EC 81 MG tablet Take 81 mg by mouth daily.   buPROPion 150 MG 12 hr tablet Commonly known as: WELLBUTRIN SR Take 150 mg by mouth 2 (two) times daily.   DULoxetine 60 MG capsule Commonly known as: CYMBALTA Take 60 mg by mouth daily.   fenofibrate 145 MG tablet Commonly known as: TRICOR Take 145 mg by mouth daily.   ferrous sulfate 325 (65 FE) MG tablet Take 325 mg by mouth daily with breakfast.   LORazepam 0.5 MG tablet Commonly known as: ATIVAN Take 1 tablet (0.5 mg total) by mouth daily as needed for anxiety.   meloxicam 15 MG tablet Commonly known as: MOBIC Take 1 tablet (15 mg total) by mouth daily.   multivitamin with minerals Tabs tablet Take 1 tablet by mouth daily.   Oxycodone HCl 10 MG Tabs Take 1 tablet (10 mg total) by mouth 3 (three) times daily as needed. Must last 30 days.   pantoprazole 40 MG tablet Commonly known as: PROTONIX Take 40 mg by mouth daily.   prednisoLONE acetate 1 % ophthalmic suspension Commonly known as: PRED FORTE Place 2 drops into the left eye daily.  predniSONE 50 MG tablet Commonly known as: DELTASONE Take 1 tablet (50 mg total) by mouth daily with breakfast for 4 days.   pregabalin 150 MG capsule Commonly known as: LYRICA Take 1 capsule (150 mg total) by mouth 3 (three) times daily. Must last 30 days   QUEtiapine 100 MG tablet Commonly known as: SEROQUEL Take 100 mg by mouth at bedtime.   timolol 0.25 % ophthalmic solution Commonly known as: BETIMOL 1-2 drops 2 (two) times daily.   timolol 0.5 % ophthalmic solution Commonly known as: TIMOPTIC    traZODone 50 MG tablet Commonly known as: DESYREL         Today   CHIEF COMPLAINT:  Feels better denies SOB     VITAL SIGNS:  Blood pressure (!) 156/81, pulse 74, temperature 97.7 F (36.5 C), temperature source Oral, resp. rate 16, height 5\' 10"  (1.778 m), weight 107.4 kg, SpO2 95 %.   REVIEW OF SYSTEMS:  Review of Systems  Constitutional: Negative.  Negative for chills, fever and malaise/fatigue.  HENT: Negative.  Negative for ear discharge, ear pain, hearing loss, nosebleeds and sore throat.   Eyes: Negative.  Negative for blurred vision and pain.  Respiratory: Negative.  Negative for cough, hemoptysis, shortness of breath and wheezing.   Cardiovascular: Negative.  Negative for chest pain, palpitations and leg swelling.  Gastrointestinal: Negative.  Negative for abdominal pain, blood in stool, diarrhea, nausea and vomiting.  Genitourinary: Negative.  Negative for dysuria.  Musculoskeletal: Negative.  Negative for back pain.  Skin: Negative.   Neurological: Negative for dizziness, tremors, speech change, focal weakness, seizures and headaches.  Endo/Heme/Allergies: Negative.  Does not bruise/bleed easily.  Psychiatric/Behavioral: Negative.  Negative for depression, hallucinations and suicidal ideas.     PHYSICAL EXAMINATION:  GENERAL:  84 y.o.-year-old patient lying in the bed with no acute distress.  NECK:  Supple, no jugular venous distention. No thyroid enlargement, no tenderness.  LUNGS: Normal breath sounds bilaterally, no wheezing, rales,rhonchi  No use of accessory muscles of respiration.  CARDIOVASCULAR: S1, S2 normal. No murmurs, rubs, or gallops.  ABDOMEN: Soft, non-tender, non-distended. Bowel sounds present. No organomegaly or mass.  EXTREMITIES: No pedal edema, cyanosis, or clubbing.  PSYCHIATRIC: The patient is alert and oriented x 3.  SKIN: No obvious rash, lesion, or ulcer.   DATA REVIEW:   CBC Recent Labs  Lab 03/30/19 0324  WBC 5.7  HGB  12.1*  HCT 39.2  PLT 157    Chemistries  Recent Labs  Lab 03/30/19 0324  04/02/19 0535  NA 142   < > 142  K 4.0   < > 3.5  CL 111   < > 107  CO2 19*   < > 24  GLUCOSE 167*   < > 122*  BUN 24*   < > 20  CREATININE 0.85   < > 0.67  CALCIUM 9.0   < > 9.1  MG 2.2   < > 2.3  AST 45*  --   --   ALT 56*  --   --   ALKPHOS 45  --   --   BILITOT 0.7  --   --    < > = values in this interval not displayed.    Cardiac Enzymes No results for input(s): TROPONINI in the last 168 hours.  Microbiology Results  @MICRORSLT48 @  RADIOLOGY:  No results found.    Allergies as of 04/02/2019      Reactions   Azithromycin Shortness Of Breath  Medication List    TAKE these medications   albuterol 108 (90 Base) MCG/ACT inhaler Commonly known as: VENTOLIN HFA Inhale 1-2 puffs into the lungs every 6 (six) hours as needed for wheezing or shortness of breath.   amLODipine 10 MG tablet Commonly known as: NORVASC Take 10 mg by mouth daily.   amoxicillin-clavulanate 875-125 MG tablet Commonly known as: Augmentin Take 1 tablet by mouth 2 (two) times daily for 5 days.   Anoro Ellipta 62.5-25 MCG/INH Aepb Generic drug: umeclidinium-vilanterol Inhale 1 puff into the lungs daily.   aspirin EC 81 MG tablet Take 81 mg by mouth daily.   buPROPion 150 MG 12 hr tablet Commonly known as: WELLBUTRIN SR Take 150 mg by mouth 2 (two) times daily.   DULoxetine 60 MG capsule Commonly known as: CYMBALTA Take 60 mg by mouth daily.   fenofibrate 145 MG tablet Commonly known as: TRICOR Take 145 mg by mouth daily.   ferrous sulfate 325 (65 FE) MG tablet Take 325 mg by mouth daily with breakfast.   LORazepam 0.5 MG tablet Commonly known as: ATIVAN Take 1 tablet (0.5 mg total) by mouth daily as needed for anxiety.   meloxicam 15 MG tablet Commonly known as: MOBIC Take 1 tablet (15 mg total) by mouth daily.   multivitamin with minerals Tabs tablet Take 1 tablet by mouth daily.    Oxycodone HCl 10 MG Tabs Take 1 tablet (10 mg total) by mouth 3 (three) times daily as needed. Must last 30 days.   pantoprazole 40 MG tablet Commonly known as: PROTONIX Take 40 mg by mouth daily.   prednisoLONE acetate 1 % ophthalmic suspension Commonly known as: PRED FORTE Place 2 drops into the left eye daily.   predniSONE 50 MG tablet Commonly known as: DELTASONE Take 1 tablet (50 mg total) by mouth daily with breakfast for 4 days.   pregabalin 150 MG capsule Commonly known as: LYRICA Take 1 capsule (150 mg total) by mouth 3 (three) times daily. Must last 30 days   QUEtiapine 100 MG tablet Commonly known as: SEROQUEL Take 100 mg by mouth at bedtime.   timolol 0.25 % ophthalmic solution Commonly known as: BETIMOL 1-2 drops 2 (two) times daily.   timolol 0.5 % ophthalmic solution Commonly known as: TIMOPTIC   traZODone 50 MG tablet Commonly known as: DESYREL         Management plans discussed with the patient and he is in agreement. Stable for discharge   Patient should follow up with pcp  CODE STATUS:     Code Status Orders  (From admission, onward)         Start     Ordered   03/29/19 0032  Full code  Continuous     03/29/19 0039        Code Status History    Date Active Date Inactive Code Status Order ID Comments User Context   02/13/2019 2332 02/18/2019 1442 Full Code [id number]  Demetrios Loll, MD Inpatient   06/12/2018 1948 06/16/2018 1336 Full Code [id number]  Maudie Flakes, MD ED   06/04/2018 1615 06/05/2018 1828 Full Code [id number]  Saundra Shelling, MD Inpatient   Advance Care Planning Activity      TOTAL TIME TAKING CARE OF THIS PATIENT: 38 minutes.    Note: This dictation was prepared with Dragon dictation along with smaller phrase technology. Any transcriptional errors that result from this process are unintentional.  Bettey Costa M.D on 04/02/2019 at 11:52 AM  Between 7am to  6pm - Pager - [phone] After 6pm go to [email or web address] -  password [email or web address]  Office  [phone]  CC: Primary care physician; Olin Hauser, DO

## 2019-04-02 NOTE — TOC Transition Note (Signed)
Transition of Care [facility]) - CM/SW Discharge Note   Patient Details  Name: Stonie Cumberbatch MRN: [id number] Date of Birth: May 16, 1935  [facility]) CM/SW Contact:  Beverly Sessions, RN Phone Number: 04/02/2019, 11:42 AM   Clinical Narrative:    Patient to discharge home today Abigail Butts caregiver to provide transportation  [place] with [facility] notified of discharge    Final next level of care: [place] Barriers to Discharge: Barriers Resolved   Patient Goals and CMS Choice Patient states their goals for this hospitalization and ongoing recovery are:: to get additional services at home CMS Medicare.gov Compare Post Acute Care list provided to:: Patient Represenative (must comment)(daughter) Choice offered to / list presented to : Adult Children  Discharge Placement                       Discharge Plan and Services   Discharge Planning Services: CM Consult Post Acute Care Choice: Home Health                    HH Arranged: Social Work, Nurse's Aide, PT, RN [place] Agency: [place] ([facility]) Date [place]: 04/02/19 Time [place]: 1142 Representative spoke with at [place]: [place] ([place]) Interventions     Readmission Risk Interventions No flowsheet data found.

## 2019-04-03 ENCOUNTER — Telehealth: Payer: Self-pay

## 2019-04-03 LAB — CULTURE, BLOOD (ROUTINE X 2)
Culture: NO GROWTH
Culture: NO GROWTH
Special Requests: ADEQUATE
Special Requests: ADEQUATE

## 2019-04-03 NOTE — Telephone Encounter (Signed)
I have made the 1st attempt to contact the patient or family member in charge, in order to follow up from recently being discharged from the hospital. I left a message on voicemail but I will make another attempt at a different time.  

## 2019-04-04 ENCOUNTER — Encounter: Payer: Self-pay | Admitting: Pain Medicine

## 2019-04-06 NOTE — Progress Notes (Signed)
Pain Management Virtual Encounter Note - Virtual Visit via Telephone Telehealth (real-time audio visits between healthcare provider and patient).   Patient's Phone No. & Preferred Pharmacy:  [phone] (home); [phone] (mobile); (Preferred) [phone] No e-mail address on record  [id number] - [place], [place] - [address] [place] [address] Phone: [phone] Fax: [phone]    Pre-screening note:  Our staff contacted Aaron Gibbs and offered him an "in person", "face-to-face" appointment versus a telephone encounter. He indicated preferring the telephone encounter, at this time.   Reason for Virtual Visit: COVID-19*  Social distancing based on CDC and AMA recommendations.   I contacted Aaron Gibbs on 04/07/2019 via telephone.      I clearly identified myself as Gaspar Cola, MD. I verified that I was speaking with the correct person using two identifiers (Name: Aaron Gibbs, and date of birth: [birth year]).  Advanced Informed Consent I sought verbal advanced consent from Aaron Gibbs for virtual visit interactions. I informed Aaron Gibbs of possible security and privacy concerns, risks, and limitations associated with providing "not-in-person" medical evaluation and management services. I also informed Aaron Gibbs of the availability of "in-person" appointments. Finally, I informed him that there would be a charge for the virtual visit and that he could be  personally, fully or partially, financially responsible for it. [name] expressed understanding and agreed to proceed.   Historic Elements   Aaron Gibbs is a 84 y.o. year old, male patient evaluated today after his last encounter by our practice on 03/14/2019. Aaron Gibbs  has a past medical history of Anxiety, Cancer ([place]), COPD (chronic obstructive pulmonary disease) ([place]), Glaucoma, and Osteoporosis. He also  has a past surgical history that includes Cholecystectomy and  Transurethral resection of prostate (N/A, 2008). Aaron Gibbs has a current medication list which includes the following prescription(s): albuterol, amlodipine, amoxicillin-clavulanate, aspirin ec, bupropion, diclofenac sodium, duloxetine, fenofibrate, ferrous sulfate, meloxicam, multivitamin with minerals, oxycodone hcl, oxycodone hcl, oxycodone hcl, pantoprazole, prednisolone acetate, pregabalin, quetiapine, timolol, trazodone, and umeclidinium-vilanterol. He  reports that he has quit smoking. He quit after 15.00 years of use. His smokeless tobacco use includes chew. He reports current alcohol use. He reports that he does not use drugs. Aaron Gibbs is allergic to azithromycin.   HPI  Today, he is being contacted for both, medication management and a post-procedure assessment.  The patient indicates having had 100% relief of the pain for the duration of the local anesthetic that in fact lasted for approximately 8 days.  He is still enjoying some benefit from the injection, but this is beginning to wear off.  Based on the results of his diagnostic bilateral genicular nerve blocks, I believe that he would be an excellent candidate for radiofrequency ablation.  Today we talked about the alternatives, and he seemed to be interested in radiofrequency ablation.  Today I have taken the time to explain to him the procedure including the risk and possible complications.  He does understand that it is a type of palliative treatment aimed at decreasing the pain for a longer period of time.   The patient indicates doing well with the current medication regimen. No adverse reactions or side effects reported to the medications.   Post-Procedure Evaluation  Procedure: Diagnostic bilateral genicular nerve block #1 under fluoroscopic guidance and IV sedation Pre-procedure pain level:  7/10 Post-procedure: 0/10 (100% relief)  Sedation: Sedation provided.  Effectiveness during initial hour after procedure(Ultra-Short  Term Relief): 100 %   Local  anesthetic used: Long-acting (4-6 hours) Effectiveness: Defined as any analgesic benefit obtained secondary to the administration of local anesthetics. This carries significant diagnostic value as to the etiological location, or anatomical origin, of the pain. Duration of benefit is expected to coincide with the duration of the local anesthetic used.  Effectiveness during initial 4-6 hours after procedure(Short-Term Relief): 100 %   Long-term benefit: Defined as any relief past the pharmacologic duration of the local anesthetics.  Effectiveness past the initial 6 hours after procedure(Long-Term Relief): 100 %(last for 8 days)   Current benefits: Defined as benefit that persist at this time.   Analgesia:  >75% relief Function: Aaron Gibbs reports improvement in function ROM: Aaron Gibbs reports improvement in ROM   Based on the above results, and the failure of all therapies in providing him with long-term benefit, I believe that he would be an excellent candidate for radiofrequency ablation of the genicular nerves, bilaterally.  Medical Necessity: Aaron Gibbs has experienced debilitating chronic pain from the bilateral knee arthralgia secondary to osteoarthritis  that has persisted for longer than three months of failed non-surgical care and has either failed to respond, or was unable to tolerate, or simply did not get enough benefit from other more conservative therapies including, but not limited to: 1. Over-the-counter oral analgesic medications (i.e.: ibuprofen, naproxen, etc.) 2. Anti-inflammatory medications 3. Muscle relaxants 4. Membrane stabilizers 5. Opioids 6. Physical therapy (PT), chiropractic manipulation, and/or home exercise program (HEP). 7. Modalities (Heat, ice, etc.) 8. Invasive techniques such as nerve blocks, intra-articular knee injections with steroids and a series of Hyalgan knee injections.  Aaron Gibbs has attained greater than 50%  reduction in pain from diagnostic injections conducted in separate occasions. For this reason, I believe it is medically necessary to proceed with Non-Pulsed Radiofrequency Ablation for the purpose of attempting to prolong the duration of the benefits seen with the diagnostic injections.  Pharmacotherapy Assessment  Analgesic: Oxycodone IR 10 mg, 1 tab PO q 8 hrs (30 mg/day of oxycodone) (has enough to last until 02/24/2019) MME/day: 45 mg/day.   Monitoring: Pharmacotherapy: No side-effects or adverse reactions reported. [place] PMP: PDMP reviewed during this encounter.       Compliance: No problems identified. Effectiveness: Clinically acceptable. Plan: Refer to "POC".  UDS:  Summary  Date Value Ref Range Status  07/10/2018 FINAL  Final    Comment:    ==================================================================== TOXASSURE COMP DRUG ANALYSIS,UR ==================================================================== Test                             Result       Flag       Units Drug Present and Declared for Prescription Verification   Morphine                       [id number]       EXPECTED   ng/mg creat   Normorphine                    221          EXPECTED   ng/mg creat    Potential sources of large amounts of morphine in the absence of    codeine include administration of morphine or use of heroin.    Normorphine is an expected metabolite of morphine.   Hydromorphone                  88  EXPECTED   ng/mg creat    Hydromorphone may be present as a metabolite of morphine;    concentrations of hydromorphone rarely exceed 5% of the morphine    concentration when this is the source of hydromorphone.   Oxycodone                      1094         EXPECTED   ng/mg creat   Oxymorphone                    1028         EXPECTED   ng/mg creat   Noroxycodone                   3205         EXPECTED   ng/mg creat   Noroxymorphone                 323          EXPECTED   ng/mg creat    Sources  of oxycodone are scheduled prescription medications.    Oxymorphone, noroxycodone, and noroxymorphone are expected    metabolites of oxycodone. Oxymorphone is also available as a    scheduled prescription medication.   Duloxetine                     PRESENT      EXPECTED   Quetiapine                     PRESENT      EXPECTED   Acetaminophen                  PRESENT      EXPECTED Drug Present not Declared for Prescription Verification   Chlorpheniramine               PRESENT      UNEXPECTED   Hydroxyzine                    PRESENT      UNEXPECTED Drug Absent but Declared for Prescription Verification   Pregabalin                     Not Detected UNEXPECTED   Salicylate                     Not Detected UNEXPECTED    Aspirin, as indicated in the declared medication list, is not    always detected even when used as directed. ==================================================================== Test                      Result    Flag   Units      Ref Range   Creatinine              94               mg/dL      >=20 ==================================================================== Declared Medications:  The flagging and interpretation on this report are based on the  following declared medications.  Unexpected results may arise from  inaccuracies in the declared medications.  **Note: The testing scope of this panel includes these medications:  Duloxetine  Morphine (Morphine Sulfate)  Oxycodone (Oxycodone Acetaminophen)  Pregabalin  Quetiapine  **Note: The testing scope of this panel does not include small to  moderate amounts of these  reported medications:  Acetaminophen (Oxycodone Acetaminophen)  Aspirin  **Note: The testing scope of this panel does not include following  reported medications:  Amlodipine (Norvasc)  Doxycycline  Fenofibrate  Iron (Ferrous Sulfate)  Meloxicam  Multivitamin  Pantoprazole (Protonix)  Polyethylene Glycol  Prednisolone  Sennosides  Timolol   Umeclidinium (Anoro Ellipta)  Vilanterol (Anoro Ellipta) ==================================================================== For clinical consultation, please call [phone]. ====================================================================    Laboratory Chemistry Profile (12 mo)  Renal: 07/10/2018: BUN/Creatinine Ratio 28 04/02/2019: BUN 20; Creatinine, Ser 0.67  Lab Results  Component Value Date   GFRAA >60 04/02/2019   GFRNONAA >60 04/02/2019   Hepatic: 03/30/2019: Albumin 3.1 Lab Results  Component Value Date   AST 45 (H) 03/30/2019   ALT 56 (H) 03/30/2019   Other: 07/10/2018: 25-Hydroxy, Vitamin D 34; 25-Hydroxy, Vitamin D-2 <1.0; 25-Hydroxy, Vitamin D-3 34; CRP 5; Sed Rate 20; Vitamin B-12 793 Note: Above Lab results reviewed.  Imaging  Last 90 days:  Dg Chest 1 View  Result Date: 03/29/2019 CLINICAL DATA:  Post intubation EXAM: CHEST  1 VIEW COMPARISON:  Radiograph 03/28/2019, 01/22/2019, CT a chest 07/02/2018 FINDINGS: Endotracheal tube terminates in the mid trachea approximately 5 cm from the carina. A transesophageal tube tip projects in the region of the lower thoracic esophagus but is poorly visualized due to patient body habitus. There is extensive consolidative opacity throughout the left lung with a somewhat mass like appearance in the left mid lung. No pneumothorax. Suspect trace bilateral effusions. Cardiomediastinal contours are similar to prior portable radiographs. Degenerative changes are present in the and imaged spine and shoulders. No acute osseous or soft tissue abnormality. IMPRESSION: 1. Endotracheal tube terminates in the mid trachea approximately 5 cm from the carina. 2. A transesophageal tube tip projects in the region of the lower thoracic esophagus but is poorly visualized due to patient body habitus. Consider abdominal radiograph to verify tip positioning. 3. Extensive consolidative opacity throughout the left lung with a somewhat mass like  appearance in the left mid lung, concerning for pneumonia. Recommend follow-up imaging post therapeutic intervention to ensure resolution Electronically Signed   By: Lovena Le M.D.   On: 03/29/2019 00:43   Dg Chest 2 View  Result Date: 02/01/2019 CLINICAL DATA:  Shortness of breath and weakness. EXAM: CHEST - 2 VIEW COMPARISON:  01/02/2019 and 07/30/2018 and chest CT 07/02/2018 FINDINGS: Patient is kyphotic. There is new blunting at the right costophrenic angle but there is no significant pleural fluid on the lateral view. Otherwise, the lungs are clear. Heart and mediastinum are within normal limits. Bridging osteophytes in the thoracic spine. IMPRESSION: Blunting at the right costophrenic angle of uncertain etiology. Findings could be related to volume loss. No definite pleural fluid. Electronically Signed   By: Markus Daft M.D.   On: 02/01/2019 10:01   Dg Abd 1 View  Result Date: 03/31/2019 CLINICAL DATA:  Ileus. EXAM: ABDOMEN - 1 VIEW COMPARISON:  One-view chest x-ray 03/30/2019. FINDINGS: Heart size is normal.  Bibasilar airspace opacities are again seen. Bowel gas pattern is unremarkable. There is no obstruction or free air. Surgical clips are present at the gallbladder fossa. Ankylosing spondylitis is again noted. IMPRESSION: 1. Normal bowel gas pattern. 2. Bibasilar airspace disease. Electronically Signed   By: San Morelle M.D.   On: 03/31/2019 09:21   Ct Head Wo Contrast  Result Date: 03/29/2019 CLINICAL DATA:  Generalized weakness EXAM: CT HEAD WITHOUT CONTRAST TECHNIQUE: Contiguous axial images were obtained from the base of the skull through the  vertex without intravenous contrast. COMPARISON:  CT brain 06/10/2018 FINDINGS: Brain: There is no mass, hemorrhage or extra-axial collection. There is generalized atrophy without lobar predilection. Hypodensity of the white matter is most commonly associated with chronic microvascular disease. Vascular: No abnormal hyperdensity of the  major intracranial arteries or dural venous sinuses. No intracranial atherosclerosis. Skull: The visualized skull base, calvarium and extracranial soft tissues are normal. Sinuses/Orbits: No fluid levels or advanced mucosal thickening of the visualized paranasal sinuses. No mastoid or middle ear effusion. The orbits are normal. IMPRESSION: Generalized atrophy and chronic microvascular ischemia without acute intracranial abnormality. Electronically Signed   By: Ulyses Jarred M.D.   On: 03/29/2019 02:00   Dg Chest Port 1 View  Result Date: 03/30/2019 CLINICAL DATA:  Acute respiratory failure EXAM: PORTABLE CHEST 1 VIEW COMPARISON:  03/29/2019 FINDINGS: Patient rotated rightward. Endotracheal tube unchanged. RIGHT PICC line in good position with tip in distal SVC. Low lung volumes. Patchy bilateral airspace disease similar to comparison exam. IMPRESSION: 1. Stable support apparatus. 2. A PICC line with tip in distal SVC. 3. Patchy bilateral airspace disease representing edema or pulmonary infection. Electronically Signed   By: Suzy Bouchard M.D.   On: 03/30/2019 08:50   Dg Chest Portable 1 View  Result Date: 03/28/2019 CLINICAL DATA:  Per EMS report, patient c/o shortness of breath while at home. EXAM: PORTABLE CHEST 1 VIEW COMPARISON:  Chest radiograph 02/13/2019 FINDINGS: Stable cardiomediastinal contours. There is a new area of consolidation in the left mid lung suspicious for infection. Bandlike opacity in the right lower lobe may reflect atelectasis. No pneumothorax or large pleural effusion. No acute finding in the visualized skeleton IMPRESSION: 1. New area of consolidation in the left mid lung suspicious for pneumonia. 2. Bandlike opacity in the right lower lobe may reflect atelectasis. Electronically Signed   By: Audie Pinto M.D.   On: 03/28/2019 22:06   Dg Chest Portable 1 View  Result Date: 02/13/2019 CLINICAL DATA:  Dyspnea, tachycardia right arm pain for 1 hour, former smoker EXAM:  PORTABLE CHEST 1 VIEW COMPARISON:  Radiograph February 01, 2019, CT July 02, 2018 FINDINGS: Lung volumes are diminished. Streaky opacities in the lungs are favored to reflect atelectasis versus early infection. No pneumothorax or effusion. Cardiomediastinal contours are unremarkable. No acute osseous or soft tissue abnormality. Degenerative changes are present in the imaged spine and shoulders. IMPRESSION: Low volumes with streaky opacities in the bases favored to reflect atelectasis versus early infection. Electronically Signed   By: Lovena Le M.D.   On: 02/13/2019 18:04   Dg Pain Clinic C-arm 1-60 Min No Report  Result Date: 03/13/2019 Fluoro was used, but no Radiologist interpretation will be provided. Please refer to "NOTES" tab for provider progress note.  [place] Ekg Site Rite  Result Date: 03/29/2019 If Site Rite image not attached, placement could not be confirmed due to current cardiac rhythm.   Assessment  The primary encounter diagnosis was Chronic pain syndrome. Diagnoses of Chronic knee pain (Primary Area of Pain) (Bilateral) (L>R), Neurogenic pain, Osteoarthritis of knee (Bilateral), and Primary osteoarthritis involving multiple joints were also pertinent to this visit.  Plan of Care  I have discontinued Aaron Gibbs "Bill"'s LORazepam. I am also having him start on Oxycodone HCl and Oxycodone HCl. Additionally, I am having him maintain his pantoprazole, amLODipine, umeclidinium-vilanterol, ferrous sulfate, fenofibrate, aspirin EC, multivitamin with minerals, DULoxetine, QUEtiapine, albuterol, prednisoLONE acetate, buPROPion, timolol, traZODone, amoxicillin-clavulanate, diclofenac sodium, pregabalin, meloxicam, and Oxycodone HCl.  Pharmacotherapy (Medications Ordered): Meds ordered  this encounter  Medications  . pregabalin (LYRICA) 150 MG capsule    Sig: Take 1 capsule (150 mg total) by mouth 3 (three) times daily. Must last 30 days    Dispense:  90 capsule    Refill:  5     Fill one day early if pharmacy is closed on scheduled refill date. May substitute for generic if available.  . meloxicam (MOBIC) 15 MG tablet    Sig: Take 1 tablet (15 mg total) by mouth daily.    Dispense:  30 tablet    Refill:  5    Fill one day early if pharmacy is closed on scheduled refill date. May substitute for generic if available.  . Oxycodone HCl 10 MG TABS    Sig: Take 1 tablet (10 mg total) by mouth 3 (three) times daily as needed. Must last 30 days.    Dispense:  90 tablet    Refill:  0    Chronic Pain: STOP Act (Not applicable) Fill 1 day early if closed on refill date. Do not fill until: 04/27/2019. To last until: 05/27/2019. Avoid benzodiazepines within 8 hours of opioids  . Oxycodone HCl 10 MG TABS    Sig: Take 1 tablet (10 mg total) by mouth 3 (three) times daily as needed. Must last 30 days.    Dispense:  90 tablet    Refill:  0    Chronic Pain: STOP Act (Not applicable) Fill 1 day early if closed on refill date. Do not fill until: 05/27/2019. To last until: 06/26/2019. Avoid benzodiazepines within 8 hours of opioids  . Oxycodone HCl 10 MG TABS    Sig: Take 1 tablet (10 mg total) by mouth 3 (three) times daily as needed. Must last 30 days.    Dispense:  90 tablet    Refill:  0    Chronic Pain: STOP Act (Not applicable) Fill 1 day early if closed on refill date. Do not fill until: 06/26/2019. To last until: 07/26/2019. Avoid benzodiazepines within 8 hours of opioids   Orders:  Orders Placed This Encounter  Procedures  . Radiofrequency,Genicular    Standing Status:   Future    Standing Expiration Date:   10/05/2020    Scheduling Instructions:     Procedure: Therapeutic bilateral genicular nerves radiofrequency ablation under fluoroscopic guidance and IV sedation     Side(s): Bilateral Knee     Level(s): Superior-Lateral, Superior-Medial, and Inferior-Medial Genicular Nerve(s)     Sedation: With Sedation     Scheduling Timeframe: As soon as pre-approved    Order Specific  Question:   Where will this procedure be performed?    Answer:   [facility]   Follow-up plan:   Return in about 4 months (around 07/23/2019) for (VV), (MM), in addition, RFA (w/ sedation): (B) Genicular Nerves RFA #1, (ASAP).      Interventional management options: Considering: Diagnostic bilateral genicular nerve block #1 Possible bilateral genicular nerve RFA   PRN Procedures: Palliative bilateral IA Hyalgan knee injections S3/N1  Palliative bilateral IA knee injections (w/ steroids) #1       Recent Visits Date Type Provider Dept  03/13/19 Procedure visit Milinda Pointer, MD [facility]  03/03/19 Office Visit Milinda Pointer, MD [facility]  02/06/19 Procedure visit Milinda Pointer, MD [facility]  01/21/19 Procedure visit Milinda Pointer, MD [facility]  Showing recent visits within past 90 days and meeting all other requirements   Today's Visits Date Type Provider Dept  04/07/19 Office Visit Milinda Pointer, MD [facility]  Showing today's visits and meeting all other requirements   Future Appointments Date Type Provider Dept  04/23/19 Appointment Milinda Pointer, MD [facility]  Showing future appointments within next 90 days and meeting all other requirements   I discussed the assessment and treatment plan with the patient. The patient was provided an opportunity to ask questions and all were answered. The patient agreed with the plan and demonstrated an understanding of the instructions.  Patient advised to call back or seek an in-person evaluation if the symptoms or condition worsens.  Total duration of non-face-to-face encounter: 15 minutes.  Note by: Gaspar Cola, MD Date: 04/07/2019; Time: 10:54 AM  Note: This dictation was prepared with Dragon dictation. Any transcriptional errors that may result from this process are unintentional.  Disclaimer:  * Given the  special circumstances of the COVID-19 pandemic, the federal government has announced that the Office for Civil Rights (OCR) will exercise its enforcement discretion and will not impose penalties on physicians using telehealth in the event of noncompliance with regulatory requirements under the [place] and [place] (HIPAA) in connection with the good faith provision of telehealth during the XX123456 national public health emergency. ([place])

## 2019-04-07 ENCOUNTER — Ambulatory Visit: Payer: Medicare Other | Attending: Pain Medicine | Admitting: Pain Medicine

## 2019-04-07 ENCOUNTER — Telehealth: Payer: Self-pay | Admitting: *Deleted

## 2019-04-07 ENCOUNTER — Other Ambulatory Visit: Payer: Self-pay

## 2019-04-07 DIAGNOSIS — G894 Chronic pain syndrome: Secondary | ICD-10-CM | POA: Diagnosis not present

## 2019-04-07 DIAGNOSIS — M792 Neuralgia and neuritis, unspecified: Secondary | ICD-10-CM | POA: Diagnosis not present

## 2019-04-07 DIAGNOSIS — M8949 Other hypertrophic osteoarthropathy, multiple sites: Secondary | ICD-10-CM

## 2019-04-07 DIAGNOSIS — M159 Polyosteoarthritis, unspecified: Secondary | ICD-10-CM

## 2019-04-07 DIAGNOSIS — G8929 Other chronic pain: Secondary | ICD-10-CM

## 2019-04-07 DIAGNOSIS — M170 Bilateral primary osteoarthritis of knee: Secondary | ICD-10-CM

## 2019-04-07 DIAGNOSIS — M25562 Pain in left knee: Secondary | ICD-10-CM

## 2019-04-07 DIAGNOSIS — M25561 Pain in right knee: Secondary | ICD-10-CM | POA: Diagnosis not present

## 2019-04-07 MED ORDER — OXYCODONE HCL 10 MG PO TABS: 10.0000 mg | ORAL_TABLET | Freq: Three times a day (TID) | ORAL | 0 refills | Status: DC | PRN

## 2019-04-07 MED ORDER — MELOXICAM 15 MG PO TABS: 15.0000 mg | ORAL_TABLET | Freq: Every day | ORAL | 5 refills | Status: DC

## 2019-04-07 MED ORDER — PREGABALIN 150 MG PO CAPS: 150.0000 mg | ORAL_CAPSULE | Freq: Three times a day (TID) | ORAL | 5 refills | Status: DC

## 2019-04-07 NOTE — Patient Instructions (Signed)

## 2019-04-08 NOTE — Telephone Encounter (Signed)
[facility], will not be [facility]. Will change primary pharmacy to [facility].

## 2019-04-08 NOTE — Telephone Encounter (Signed)
[facility] called back and said the prescription has not gotten the script. They said [facility]'t transfer [facility]. He said he is trying to get this done so the patient wont be mad. He said he would take a verbal order, he just wants to be done with this.

## 2019-04-08 NOTE — Telephone Encounter (Signed)
Called [facility] and informed the pharmacist that Dr. Judithann Sauger NOT be transferring [facility] scripts [facility].

## 2019-04-09 ENCOUNTER — Ambulatory Visit (INDEPENDENT_AMBULATORY_CARE_PROVIDER_SITE_OTHER): Payer: Medicare Other | Admitting: Family Medicine

## 2019-04-09 ENCOUNTER — Other Ambulatory Visit: Payer: Self-pay

## 2019-04-09 ENCOUNTER — Encounter: Payer: Self-pay | Admitting: Family Medicine

## 2019-04-09 VITALS — BP 136/74 | HR 67 | Temp 98.4°F | Resp 16 | Ht 70.0 in | Wt 255.0 lb

## 2019-04-09 DIAGNOSIS — Z23 Encounter for immunization: Secondary | ICD-10-CM

## 2019-04-09 DIAGNOSIS — J189 Pneumonia, unspecified organism: Secondary | ICD-10-CM | POA: Diagnosis not present

## 2019-04-09 DIAGNOSIS — J441 Chronic obstructive pulmonary disease with (acute) exacerbation: Secondary | ICD-10-CM

## 2019-04-09 DIAGNOSIS — J9601 Acute respiratory failure with hypoxia: Secondary | ICD-10-CM | POA: Insufficient documentation

## 2019-04-09 DIAGNOSIS — J9621 Acute and chronic respiratory failure with hypoxia: Secondary | ICD-10-CM | POA: Diagnosis not present

## 2019-04-09 NOTE — Assessment & Plan Note (Addendum)
Resolved s/p IV antibiotic and finished oral augmentin on discharge Secondary complication of AECOPD and on ventilator with acute resp failure.  No further antibiotics needed at this time. Monitor closely and follow up as advised if need to extend course or add other therapy

## 2019-04-09 NOTE — Assessment & Plan Note (Signed)
Resolved S/p antibiotics and prednisone course Now back to baseline resp status. Improved S/p mechanical ventilation.

## 2019-04-09 NOTE — Assessment & Plan Note (Signed)
Resolved Secondary to AECOPD S/p mechanical ventilator in hospital

## 2019-04-09 NOTE — Patient Instructions (Addendum)
Thank you for coming to the office today.  High Dose Flu vaccine today Prevnar-13 pneumonia vaccine today -booster due in 1 year.  Lungs much improved. Keep on the right track and use inhalers. And other medicine. No extension of prednisone / antibiotic needed today.  Please schedule a Follow-up Appointment to: Return in about 3 months (around 07/10/2019) for 3 month follow-up Virtual Phone COPD.  If you have any other questions or concerns, please feel free to call the office or send a message through [facility]. You may also schedule an earlier appointment if necessary.  Additionally, you may be receiving a survey about your experience at our office within a few days to 1 week by e-mail or mail. We value your feedback.  [name], DO [place]

## 2019-04-09 NOTE — Progress Notes (Signed)
Subjective:    Patient ID: Aaron Gibbs, male    DOB: 1934-09-07, 84 y.o.   MRN: [id number]  Aaron Gibbs is a 85 y.o. male presenting on 04/09/2019 for Hospitalization Follow-up (Acute on chronic respiratory failure)   HPI  HOSPITAL FOLLOW-UP VISIT  Hospital/Location: [facility] Date of Admission: 03/28/19 Date of Discharge: 04/02/19 Transitions of care telephone call: Attempted 04/03/19 Tiffany Hill LPN  Reason for Admission Diagnosis : Acute Respiratory Failure, COPD / Pneumonia  - Hospital H&P and Discharge Summary have been reviewed - Patient presents today 7 days after recent hospitalization. Brief summary of recent course, patient had symptoms of acute respiratory failure / COPD exac / pneumonia, hospitalized to ICU on mechanical ventilation intubated, on IV antibiotics, steroid course, and then transitioned to orals once extubated and discharged.  - Today reports overall has done well after discharge. Symptoms of COPD / Pneumonia are resolving. He feels breathing is improved near his baseline, but he still has reduced energy now starting to improve as well.  Referred to Pulm previous 01/2019 - but did not schedule, he needs to call  - New medications on discharge: Augmentin(finished) and Prednisone (finished)  Denies fevers, chills, sweats, chest pain, worsening dyspnea, edema, productive cough, headache  I have reviewed the discharge medication list, and have reconciled the current and discharge medications today.   Current Outpatient Medications:  .  albuterol (VENTOLIN HFA) 108 (90 Base) MCG/ACT inhaler, Inhale 1-2 puffs into the lungs every 6 (six) hours as needed for wheezing or shortness of breath., Disp: 8 g, Rfl: 0 .  amLODipine (NORVASC) 10 MG tablet, Take 10 mg by mouth daily., Disp: , Rfl:  .  aspirin EC 81 MG tablet, Take 81 mg by mouth daily., Disp: , Rfl:  .  buPROPion (WELLBUTRIN SR) 150 MG 12 hr tablet, Take 150 mg by mouth 2 (two) times daily. , Disp: ,  Rfl:  .  diclofenac sodium (VOLTAREN) 1 % GEL, Apply 1 application topically as needed., Disp: , Rfl:  .  DULoxetine (CYMBALTA) 60 MG capsule, Take 60 mg by mouth daily. , Disp: , Rfl:  .  fenofibrate (TRICOR) 145 MG tablet, Take 145 mg by mouth daily., Disp: , Rfl:  .  ferrous sulfate 325 (65 FE) MG tablet, Take 325 mg by mouth daily with breakfast., Disp: , Rfl:  .  meloxicam (MOBIC) 15 MG tablet, Take 1 tablet (15 mg total) by mouth daily., Disp: 30 tablet, Rfl: 5 .  Multiple Vitamin (MULTIVITAMIN WITH MINERALS) TABS tablet, Take 1 tablet by mouth daily., Disp: , Rfl:  .  [START ON 04/27/2019] Oxycodone HCl 10 MG TABS, Take 1 tablet (10 mg total) by mouth 3 (three) times daily as needed. Must last 30 days., Disp: 90 tablet, Rfl: 0 .  [START ON 05/27/2019] Oxycodone HCl 10 MG TABS, Take 1 tablet (10 mg total) by mouth 3 (three) times daily as needed. Must last 30 days., Disp: 90 tablet, Rfl: 0 .  [START ON 06/26/2019] Oxycodone HCl 10 MG TABS, Take 1 tablet (10 mg total) by mouth 3 (three) times daily as needed. Must last 30 days., Disp: 90 tablet, Rfl: 0 .  pantoprazole (PROTONIX) 40 MG tablet, Take 40 mg by mouth daily. , Disp: , Rfl:  .  prednisoLONE acetate (PRED FORTE) 1 % ophthalmic suspension, Place 2 drops into the left eye daily., Disp: 5 mL, Rfl: 0 .  pregabalin (LYRICA) 150 MG capsule, Take 1 capsule (150 mg total) by mouth 3 (three) times daily.  Must last 30 days, Disp: 90 capsule, Rfl: 5 .  QUEtiapine (SEROQUEL) 100 MG tablet, Take 100 mg by mouth at bedtime. , Disp: , Rfl:  .  timolol (TIMOPTIC) 0.5 % ophthalmic solution, , Disp: , Rfl:  .  traZODone (DESYREL) 50 MG tablet, Take 50 mg by mouth at bedtime. , Disp: , Rfl:  .  umeclidinium-vilanterol (ANORO ELLIPTA) 62.5-25 MCG/INH AEPB, Inhale 1 puff into the lungs daily., Disp: , Rfl:   ------------------------------------------------------------------------- Social History   Tobacco Use  . Smoking status: Former Smoker    Years:  15.00  . Smokeless tobacco: Current User    Types: Chew  . Tobacco comment: stopped 15 years ago  Substance Use Topics  . Alcohol use: Yes    Comment: past  . Drug use: Never    Review of Systems Per HPI unless specifically indicated above     Objective:    BP 136/74   Pulse 67   Temp 98.4 F (36.9 C) (Oral)   Resp 16   Ht 5\' 10"  (1.778 m)   Wt 255 lb (115.7 kg)   SpO2 99%   BMI 36.59 kg/m   Wt Readings from Last 3 Encounters:  04/09/19 255 lb (115.7 kg)  03/31/19 236 lb 12.4 oz (107.4 kg)  03/13/19 240 lb (108.9 kg)    Physical Exam Vitals signs and nursing note reviewed.  Constitutional:      General: He is not in acute distress.    Appearance: He is well-developed.     Comments: Chronically ill 84 yr old male, mostly comfortable, cooperative, has walker  HENT:     Head: Normocephalic and atraumatic.  Eyes:     General:        Right eye: No discharge.        Left eye: No discharge.     Conjunctiva/sclera: Conjunctivae normal.  Neck:     Musculoskeletal: Normal range of motion and neck supple.  Cardiovascular:     Rate and Rhythm: Normal rate and regular rhythm.     Heart sounds: Normal heart sounds. No murmur.  Pulmonary:     Effort: Pulmonary effort is normal. No respiratory distress.     Breath sounds: No wheezing or rales.     Comments: Significantly improved breath sounds. Good air movement today. Mild slight reduced sound LLL with slight rhonchi but overall significantly clears with deep breath. Musculoskeletal:     Comments: Using walker for ambulation.  Able to stand from seated position.  Significant increased thoracic kyphosis on exam.  Generalized muscle hypertonicity bilateral paraspinal back muscles thoracic into lumbar.  Skin:    General: Skin is warm.     Findings: No erythema or rash.  Neurological:     Mental Status: He is alert and oriented to person, place, and time.  Psychiatric:        Behavior: Behavior normal.     Comments:  Well groomed, good eye contact, normal speech and thoughts. Mood is improved.       Results for orders placed or performed during the hospital encounter of 03/28/19  SARS Coronavirus 2 by RT PCR (hospital order, performed in [facility] hospital lab) Nasopharyngeal Nasopharyngeal Swab   Specimen: Nasopharyngeal Swab  Result Value Ref Range   SARS Coronavirus 2 NEGATIVE NEGATIVE  MRSA PCR Screening   Specimen: Nasopharyngeal  Result Value Ref Range   MRSA by PCR NEGATIVE NEGATIVE  Culture, blood (Routine X 2) w Reflex to ID Panel   Specimen: BLOOD  Result Value Ref Range   Specimen Description BLOOD R HAND    Special Requests      BOTTLES DRAWN AEROBIC ONLY Blood Culture adequate volume   Culture      NO GROWTH 5 DAYS Performed at [facility], [address] [place], [place] [address]    Report Status 04/03/2019 FINAL   Culture, blood (Routine X 2) w Reflex to ID Panel   Specimen: BLOOD  Result Value Ref Range   Specimen Description BLOOD RFOA    Special Requests      BOTTLES DRAWN AEROBIC AND ANAEROBIC Blood Culture adequate volume   Culture      NO GROWTH 5 DAYS Performed at [facility], [address], [place], [place] [address]    Report Status 04/03/2019 FINAL   Culture, respiratory (non-expectorated)   Specimen: Tracheal Aspirate; Respiratory  Result Value Ref Range   Specimen Description      TRACHEAL ASPIRATE Performed at [facility], [address] [place], [place] [address]    Special Requests      NONE Performed at [facility], [place], [place] [address]    Gram Stain      MODERATE WBC PRESENT, PREDOMINANTLY PMN MODERATE GRAM POSITIVE COCCI IN CLUSTERS RARE GRAM NEGATIVE RODS    Culture      FEW Consistent with normal respiratory flora. Performed at [facility], [place] [address], [place], [place] [address]    Report Status 04/01/2019 FINAL   Urine Culture   Specimen: Urine, Random   Result Value Ref Range   Specimen Description      URINE, RANDOM Performed at [facility], [address], [place], [place] [address]    Special Requests      NONE Performed at [facility], [address] [place], [place] [address]    Culture      NO GROWTH Performed at [facility], [place] [address], [place],  [address]    Report Status 03/30/2019 FINAL   Comprehensive metabolic panel  Result Value Ref Range   Sodium 140 135 - 145 mmol/L   Potassium 4.5 3.5 - 5.1 mmol/L   Chloride 109 98 - 111 mmol/L   CO2 19 (L) 22 - 32 mmol/L   Glucose, Bld 145 (H) 70 - 99 mg/dL   BUN 33 (H) 8 - 23 mg/dL   Creatinine, Ser 1.21 0.61 - 1.24 mg/dL   Calcium 9.3 8.9 - 10.3 mg/dL   Total Protein 7.4 6.5 - 8.1 g/dL   Albumin 3.4 (L) 3.5 - 5.0 g/dL   AST 52 (H) 15 - 41 U/L   ALT 45 (H) 0 - 44 U/L   Alkaline Phosphatase 51 38 - 126 U/L   Total Bilirubin 0.8 0.3 - 1.2 mg/dL   GFR calc non Af Amer 55 (L) >60 mL/min   GFR calc Af Amer >60 >60 mL/min   Anion gap 12 5 - 15  CBC with Differential  Result Value Ref Range   WBC 7.7 4.0 - 10.5 K/uL   RBC 4.51 4.22 - 5.81 MIL/uL   Hemoglobin 13.3 13.0 - 17.0 g/dL   HCT 41.7 39.0 - 52.0 %   MCV 92.5 80.0 - 100.0 fL   MCH 29.5 26.0 - 34.0 pg   MCHC 31.9 30.0 - 36.0 g/dL   RDW 15.9 (H) 11.5 - 15.5 %   Platelets 179 150 - 400 K/uL   nRBC 0.0 0.0 -  0.2 %   Neutrophils Relative % 63 %   Neutro Abs 4.8 1.7 - 7.7 K/uL   Lymphocytes Relative 18 %   Lymphs Abs 1.4 0.7 - 4.0 K/uL   Monocytes Relative 13 %   Monocytes Absolute 1.0 0.1 - 1.0 K/uL   Eosinophils Relative 5 %   Eosinophils Absolute 0.4 0.0 - 0.5 K/uL   Basophils Relative 0 %   Basophils Absolute 0.0 0.0 - 0.1 K/uL   Immature Granulocytes 1 %   Abs Immature Granulocytes 0.04 0.00 - 0.07 K/uL  Brain natriuretic peptide  Result Value Ref Range   B Natriuretic Peptide 52.0 0.0 - 100.0 pg/mL  Urinalysis, Complete w Microscopic  Result Value Ref Range    Color, Urine YELLOW (A) YELLOW   APPearance HAZY (A) CLEAR   Specific Gravity, Urine 1.025 1.005 - 1.030   pH 5.0 5.0 - 8.0   Glucose, UA NEGATIVE NEGATIVE mg/dL   Hgb urine dipstick NEGATIVE NEGATIVE   Bilirubin Urine NEGATIVE NEGATIVE   Ketones, ur NEGATIVE NEGATIVE mg/dL   Protein, ur 30 (A) NEGATIVE mg/dL   Nitrite NEGATIVE NEGATIVE   Leukocytes,Ua MODERATE (A) NEGATIVE   RBC / HPF 0-5 0 - 5 RBC/hpf   WBC, UA >50 (H) 0 - 5 WBC/hpf   Bacteria, UA NONE SEEN NONE SEEN   Squamous Epithelial / LPF 0-5 0 - 5   Mucus PRESENT   Lactic acid, plasma  Result Value Ref Range   Lactic Acid, Venous 1.9 0.5 - 1.9 mmol/L  Blood gas, arterial ([facility] ONLY)  Result Value Ref Range   FIO2 0.50    VT 500 mL   Peep/cpap 5.0 cm H20   pH, Arterial 7.28 (L) 7.350 - 7.450   pCO2 arterial 47 32.0 - 48.0 mmHg   pO2, Arterial 113 (H) 83.0 - 108.0 mmHg   Bicarbonate 22.1 20.0 - 28.0 mmol/L   Acid-base deficit 4.8 (H) 0.0 - 2.0 mmol/L   O2 Saturation 97.8 %   Patient temperature 37.0    Collection site RIGHT RADIAL    Sample type ARTERIAL DRAW    Allens test (pass/fail) PASS PASS   Mechanical Rate 20   Legionella Pneumophila Serogp 1 Ur Ag  Result Value Ref Range   L. pneumophila Serogp 1 Ur Ag Negative Negative   Source of Sample URINE, RANDOM   Strep pneumoniae urinary antigen  (not at [facility])  Result Value Ref Range   Strep Pneumo Urinary Antigen NEGATIVE NEGATIVE  Procalcitonin  Result Value Ref Range   Procalcitonin 0.26 ng/mL  CBC  Result Value Ref Range   WBC 7.1 4.0 - 10.5 K/uL   RBC 4.54 4.22 - 5.81 MIL/uL   Hemoglobin 13.4 13.0 - 17.0 g/dL   HCT 42.5 39.0 - 52.0 %   MCV 93.6 80.0 - 100.0 fL   MCH 29.5 26.0 - 34.0 pg   MCHC 31.5 30.0 - 36.0 g/dL   RDW 15.9 (H) 11.5 - 15.5 %   Platelets 169 150 - 400 K/uL   nRBC 0.0 0.0 - 0.2 %  Basic metabolic panel  Result Value Ref Range   Sodium 142 135 - 145 mmol/L   Potassium 4.5 3.5 - 5.1 mmol/L   Chloride 113 (H) 98 - 111 mmol/L    CO2 21 (L) 22 - 32 mmol/L   Glucose, Bld 179 (H) 70 - 99 mg/dL   BUN 35 (H) 8 - 23 mg/dL   Creatinine, Ser 1.14 0.61 - 1.24 mg/dL  Calcium 9.3 8.9 - 10.3 mg/dL   GFR calc non Af Amer 60 (L) >60 mL/min   GFR calc Af Amer >60 >60 mL/min   Anion gap 8 5 - 15  HIV Antibody (routine testing w rflx)  Result Value Ref Range   HIV Screen 4th Generation wRfx NON REACTIVE NON REACTIVE  Triglycerides  Result Value Ref Range   Triglycerides 187 (H) <150 mg/dL  Magnesium  Result Value Ref Range   Magnesium 2.1 1.7 - 2.4 mg/dL  Phosphorus  Result Value Ref Range   Phosphorus 4.0 2.5 - 4.6 mg/dL  Urine Drug Screen, Qualitative ([facility] only)  Result Value Ref Range   Tricyclic, Ur Screen POSITIVE (A) NONE DETECTED   Amphetamines, Ur Screen NONE DETECTED NONE DETECTED   MDMA (Ecstasy)Ur Screen NONE DETECTED NONE DETECTED   Cocaine Metabolite,Ur [place] NONE DETECTED NONE DETECTED   Opiate, Ur Screen POSITIVE (A) NONE DETECTED   Phencyclidine (PCP) Ur S NONE DETECTED NONE DETECTED   Cannabinoid 50 Ng, Ur [place] NONE DETECTED NONE DETECTED   Barbiturates, Ur Screen NONE DETECTED NONE DETECTED   Benzodiazepine, Ur Scrn POSITIVE (A) NONE DETECTED   Methadone Scn, Ur NONE DETECTED NONE DETECTED  Glucose, capillary  Result Value Ref Range   Glucose-Capillary 178 (H) 70 - 99 mg/dL  Blood gas, arterial  Result Value Ref Range   FIO2 0.50    Delivery systems VENTILATOR    Mode PRESSURE REGULATED VOLUME CONTROL    VT 500 mL   Peep/cpap 5.0 cm H20   pH, Arterial 7.38 7.350 - 7.450   pCO2 arterial 38 32.0 - 48.0 mmHg   pO2, Arterial 135 (H) 83.0 - 108.0 mmHg   Bicarbonate 22.5 20.0 - 28.0 mmol/L   Acid-base deficit 2.3 (H) 0.0 - 2.0 mmol/L   O2 Saturation 99.0 %   Patient temperature 37.0    Collection site RIGHT RADIAL    Sample type ARTERIAL DRAW    Allens test (pass/fail) PASS PASS   Mechanical Rate 20   Glucose, capillary  Result Value Ref Range   Glucose-Capillary 119 (H) 70 - 99 mg/dL   Glucose, capillary  Result Value Ref Range   Glucose-Capillary 155 (H) 70 - 99 mg/dL  Triglycerides  Result Value Ref Range   Triglycerides 228 (H) <150 mg/dL  Procalcitonin  Result Value Ref Range   Procalcitonin 0.14 ng/mL  CBC  Result Value Ref Range   WBC 5.7 4.0 - 10.5 K/uL   RBC 4.14 (L) 4.22 - 5.81 MIL/uL   Hemoglobin 12.1 (L) 13.0 - 17.0 g/dL   HCT 39.2 39.0 - 52.0 %   MCV 94.7 80.0 - 100.0 fL   MCH 29.2 26.0 - 34.0 pg   MCHC 30.9 30.0 - 36.0 g/dL   RDW 15.8 (H) 11.5 - 15.5 %   Platelets 157 150 - 400 K/uL   nRBC 0.0 0.0 - 0.2 %  Comprehensive metabolic panel  Result Value Ref Range   Sodium 142 135 - 145 mmol/L   Potassium 4.0 3.5 - 5.1 mmol/L   Chloride 111 98 - 111 mmol/L   CO2 19 (L) 22 - 32 mmol/L   Glucose, Bld 167 (H) 70 - 99 mg/dL   BUN 24 (H) 8 - 23 mg/dL   Creatinine, Ser 0.85 0.61 - 1.24 mg/dL   Calcium 9.0 8.9 - 10.3 mg/dL   Total Protein 6.6 6.5 - 8.1 g/dL   Albumin 3.1 (L) 3.5 - 5.0 g/dL   AST 45 (H) 15 -  41 U/L   ALT 56 (H) 0 - 44 U/L   Alkaline Phosphatase 45 38 - 126 U/L   Total Bilirubin 0.7 0.3 - 1.2 mg/dL   GFR calc non Af Amer >60 >60 mL/min   GFR calc Af Amer >60 >60 mL/min   Anion gap 12 5 - 15  Magnesium  Result Value Ref Range   Magnesium 2.2 1.7 - 2.4 mg/dL  Phosphorus  Result Value Ref Range   Phosphorus 3.4 2.5 - 4.6 mg/dL  Glucose, capillary  Result Value Ref Range   Glucose-Capillary 175 (H) 70 - 99 mg/dL  Glucose, capillary  Result Value Ref Range   Glucose-Capillary 154 (H) 70 - 99 mg/dL  Procalcitonin  Result Value Ref Range   Procalcitonin <0.10 ng/mL  Glucose, capillary  Result Value Ref Range   Glucose-Capillary 120 (H) 70 - 99 mg/dL  Glucose, capillary  Result Value Ref Range   Glucose-Capillary 152 (H) 70 - 99 mg/dL   Comment 1 Notify RN    Comment 2 Document in Chart   Basic metabolic panel  Result Value Ref Range   Sodium 143 135 - 145 mmol/L   Potassium 3.9 3.5 - 5.1 mmol/L   Chloride 115 (H) 98 - 111  mmol/L   CO2 23 22 - 32 mmol/L   Glucose, Bld 124 (H) 70 - 99 mg/dL   BUN 23 8 - 23 mg/dL   Creatinine, Ser 0.69 0.61 - 1.24 mg/dL   Calcium 9.0 8.9 - 10.3 mg/dL   GFR calc non Af Amer >60 >60 mL/min   GFR calc Af Amer >60 >60 mL/min   Anion gap 5 5 - 15  Glucose, capillary  Result Value Ref Range   Glucose-Capillary 125 (H) 70 - 99 mg/dL  Glucose, capillary  Result Value Ref Range   Glucose-Capillary 178 (H) 70 - 99 mg/dL  Glucose, capillary  Result Value Ref Range   Glucose-Capillary 109 (H) 70 - 99 mg/dL  Glucose, capillary  Result Value Ref Range   Glucose-Capillary 112 (H) 70 - 99 mg/dL   Comment 1 Notify RN   Glucose, capillary  Result Value Ref Range   Glucose-Capillary 88 70 - 99 mg/dL  Basic metabolic panel  Result Value Ref Range   Sodium 138 135 - 145 mmol/L   Potassium 3.8 3.5 - 5.1 mmol/L   Chloride 106 98 - 111 mmol/L   CO2 23 22 - 32 mmol/L   Glucose, Bld 187 (H) 70 - 99 mg/dL   BUN 22 8 - 23 mg/dL   Creatinine, Ser 0.80 0.61 - 1.24 mg/dL   Calcium 9.2 8.9 - 10.3 mg/dL   GFR calc non Af Amer >60 >60 mL/min   GFR calc Af Amer >60 >60 mL/min   Anion gap 9 5 - 15  Magnesium  Result Value Ref Range   Magnesium 2.0 1.7 - 2.4 mg/dL  Glucose, capillary  Result Value Ref Range   Glucose-Capillary 100 (H) 70 - 99 mg/dL  Basic metabolic panel  Result Value Ref Range   Sodium 142 135 - 145 mmol/L   Potassium 3.5 3.5 - 5.1 mmol/L   Chloride 107 98 - 111 mmol/L   CO2 24 22 - 32 mmol/L   Glucose, Bld 122 (H) 70 - 99 mg/dL   BUN 20 8 - 23 mg/dL   Creatinine, Ser 0.67 0.61 - 1.24 mg/dL   Calcium 9.1 8.9 - 10.3 mg/dL   GFR calc non Af Amer >60 >60 mL/min  GFR calc Af Amer >60 >60 mL/min   Anion gap 11 5 - 15  Magnesium  Result Value Ref Range   Magnesium 2.3 1.7 - 2.4 mg/dL  Troponin I (High Sensitivity)  Result Value Ref Range   Troponin I (High Sensitivity) 11 <18 ng/L      Assessment & Plan:   Problem List Items Addressed This Visit    RESOLVED:  Pneumonia    Resolved s/p IV antibiotic and finished oral augmentin on discharge Secondary complication of AECOPD and on ventilator with acute resp failure.  No further antibiotics needed at this time. Monitor closely and follow up as advised if need to extend course or add other therapy      RESOLVED: Acute on chronic respiratory failure with hypoxia ([place]) - Primary    Resolved Secondary to AECOPD S/p mechanical ventilator in hospital      RESOLVED: Acute exacerbation of chronic obstructive pulmonary disease (COPD) ([place])    Resolved S/p antibiotics and prednisone course Now back to baseline resp status. Improved S/p mechanical ventilation.       Other Visit Diagnoses    Needs flu shot       Relevant Orders   Flu Vaccine QUAD High Dose(Fluad) (Completed)   Need for vaccination with 13-polyvalent pneumococcal conjugate vaccine       Relevant Orders   Pneumococcal conjugate vaccine 13-valent IM (Completed)      COPD Chronic problem Recurrent flares Encourage him to call Pulmonology to re-establish with new patient visit as previously advised. If he needs new referral we can place. I am concerned with his frequency of flare ups  Proceed with vaccines today >7 days after discharge, afebrile, off antibiotics/steroids.  No orders of the defined types were placed in this encounter.   Follow up plan: Return in about 3 months (around 07/10/2019) for 3 month follow-up Virtual Phone COPD.   [name], [facility] 04/09/2019, 11:21 AM

## 2019-04-14 IMAGING — CT CT ANGIO CHEST
2 of 6 series · 18 of 46 positions shown · IV contrast (APPLIED)
Comparison: 06/12/2018.

CLINICAL DATA: Shortness of breath for the past 2 days. Chest
tightness. Recent fall with chest and abdomen bruising

EXAM:
CT ANGIOGRAPHY CHEST WITH CONTRAST
TECHNIQUE: Multidetector CT imaging of the chest was performed using the
standard protocol during bolus administration of intravenous
contrast. Multiplanar CT image reconstructions and MIPs were
obtained to evaluate the vascular anatomy.
CONTRAST:  75mL OMNIPAQUE IOHEXOL 350 MG/ML SOLN

[Series 5: thins · axial · 0.94mm/px · z∈[-305,-71]mm · 15 of 258 slices shown]
[im 12/258  lung]
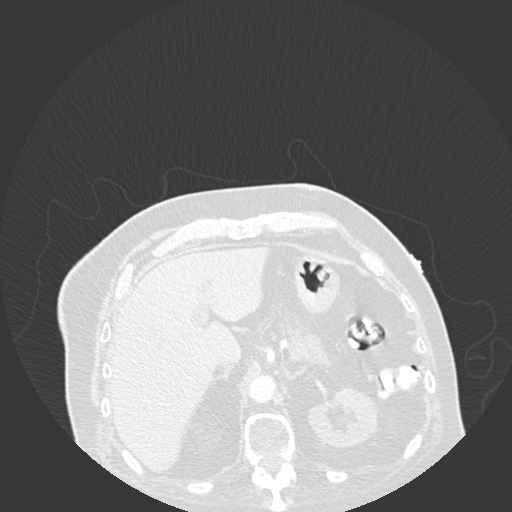
[im 34/258  soft-tissue]
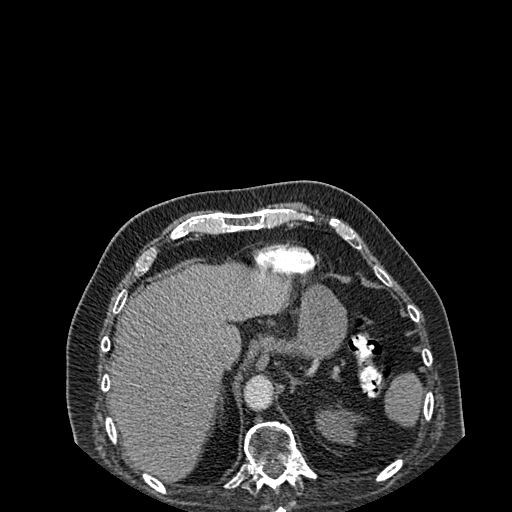
[im 45/258  lung]
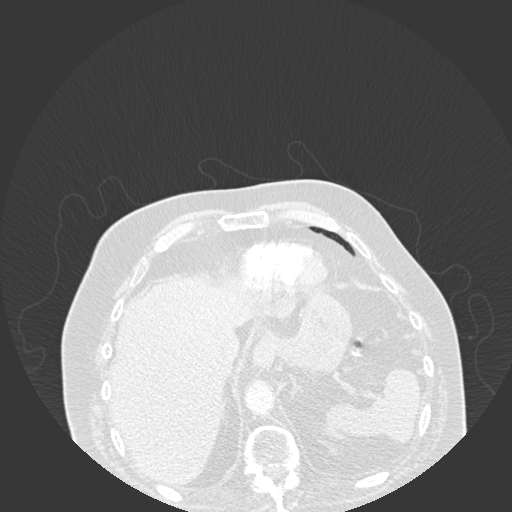
[im 68/258  soft-tissue]
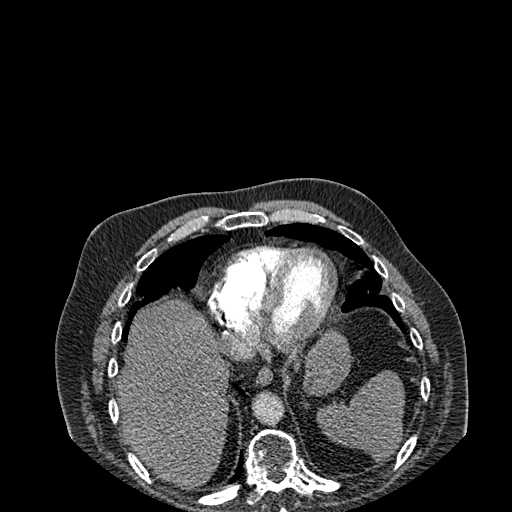
[im 79/258  lung]
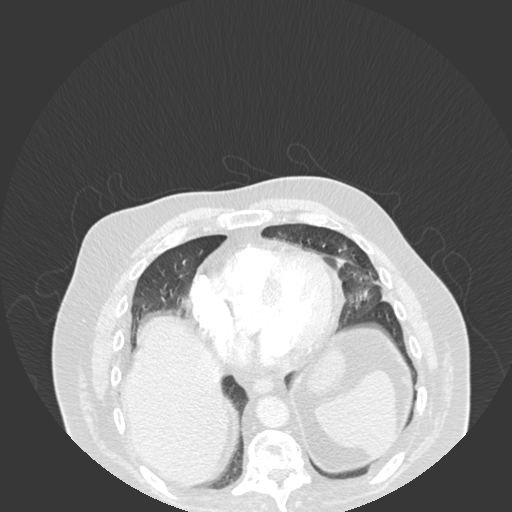
[im 101/258  soft-tissue]
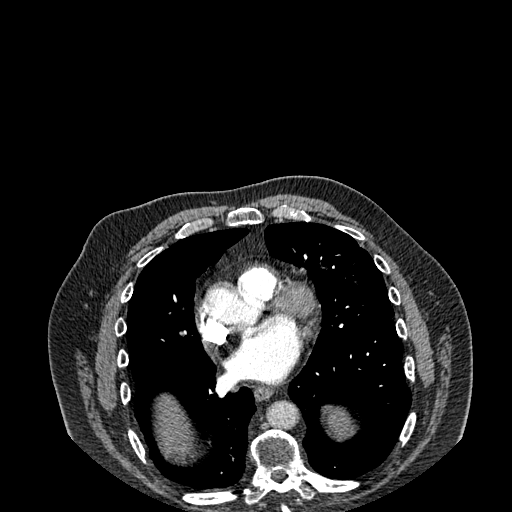
[im 112/258  lung]
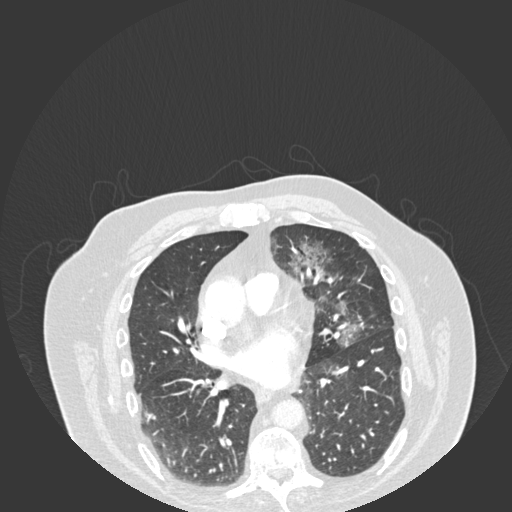
[im 135/258  soft-tissue]
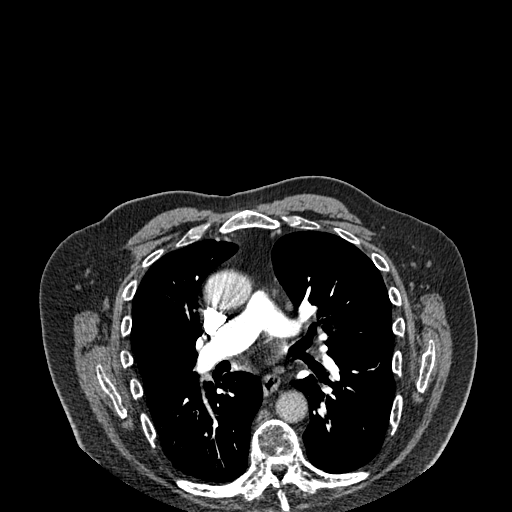
[im 146/258  lung]
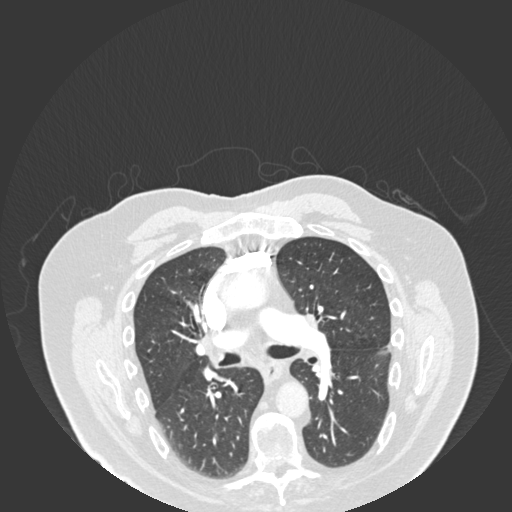
[im 157/258  soft-tissue]
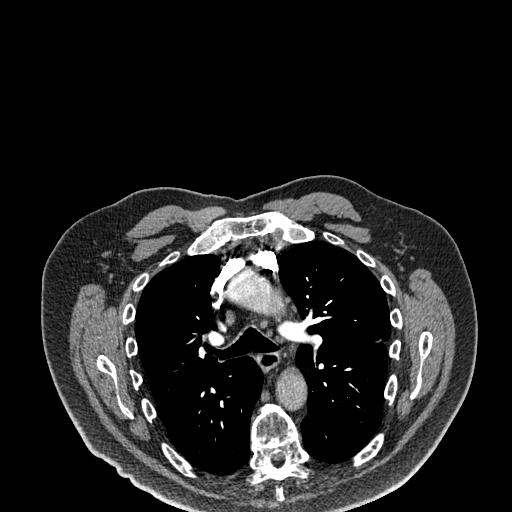
[im 179/258  lung]
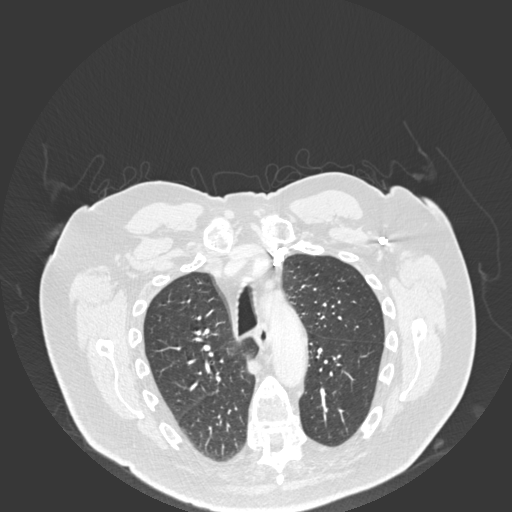
[im 190/258  soft-tissue]
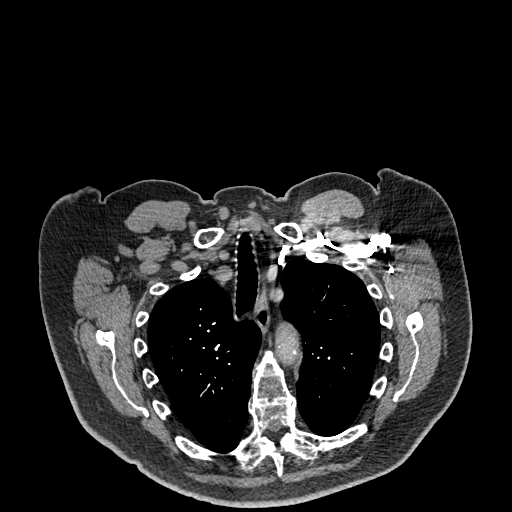
[im 213/258  lung]
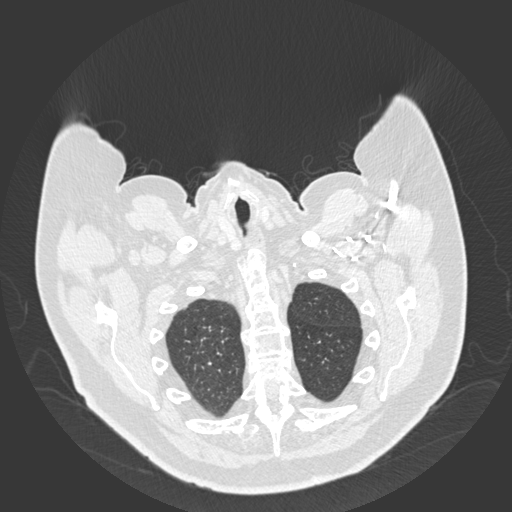
[im 224/258  soft-tissue]
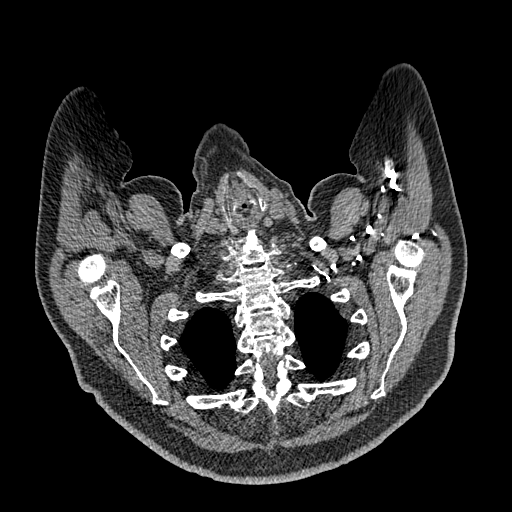
[im 246/258  lung]
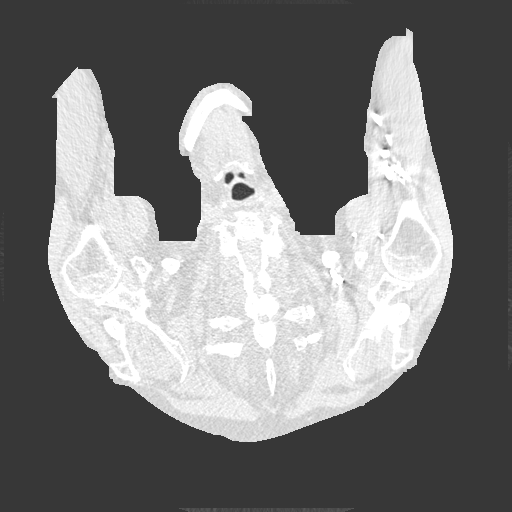

[Series 7: coronal mpr · coronal · 0.50mm/px · 3 of 106 slices shown]
[im 27/106  soft-tissue]
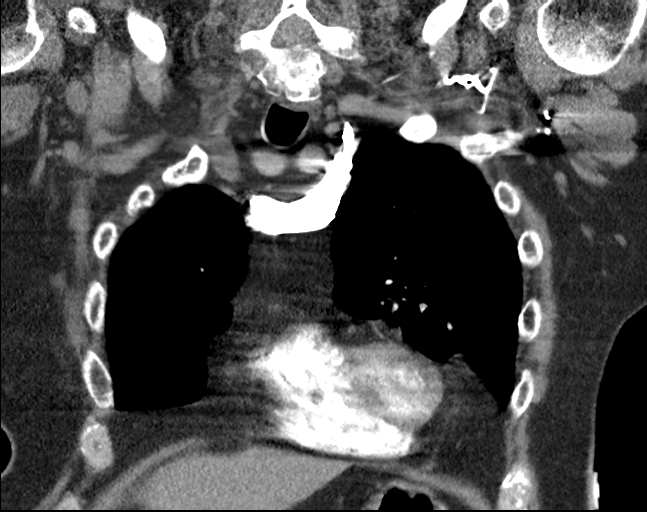
[im 53/106  soft-tissue]
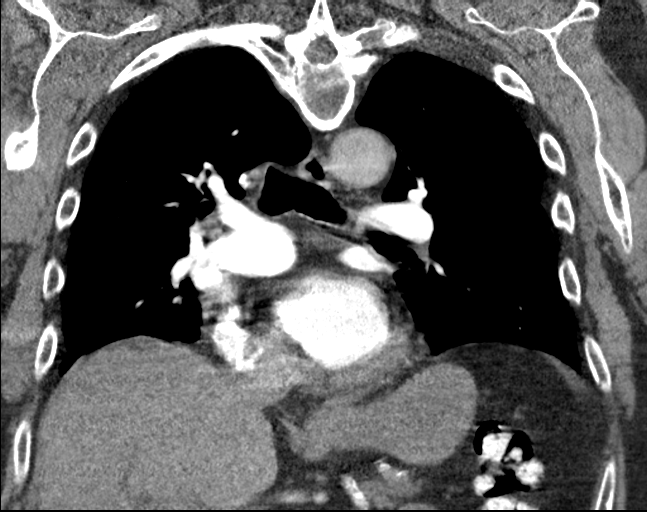
[im 79/106  soft-tissue]
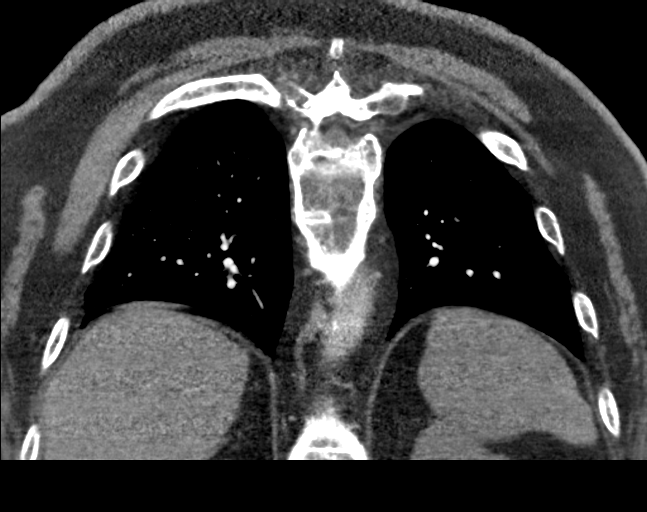

[18 of 46 positions shown; findings below may reference images not displayed]

FINDINGS: Cardiovascular: Normally opacified pulmonary arteries with no
pulmonary arterial filling defects seen. Normal sized heart. Mild
atheromatous aortic calcifications.

Mediastinum/Nodes: 1.6 cm right lobe thyroid nodule on image number
20 series 4. No enlarged lymph nodes. Unremarkable esophagus.

Lungs/Pleura: Increased patchy interstitial opacity in the left
upper lobe. Slight increase in thickness of bilateral linear
atelectasis or scarring in the left upper lobe. Minimal atelectasis
or scarring at the right lung base with improvement. No pleural
fluid.

Upper Abdomen: Unremarkable.

Musculoskeletal: Stable diffuse ankylosis throughout the thoracic
and cervical spine with accentuated kyphosis. Sternomanubrial joint
degenerative changes. No fractures are seen.

Review of the MIP images confirms the above findings.
IMPRESSION: 1. No pulmonary emboli.
2. Increased patchy interstitial opacity in the left upper lobe,
most likely due to pneumonia. This is not felt to be due to
pulmonary contusions since there were similar, less prominent
changes previously in the left upper lobe.
3. 1.6 cm right lobe thyroid nodule. Consider further evaluation
with thyroid ultrasound. If patient is clinically hyperthyroid,
consider nuclear medicine thyroid uptake and scan.

Aortic Atherosclerosis ([id number].J).

## 2019-04-22 ENCOUNTER — Ambulatory Visit: Payer: Medicare Other | Admitting: Pain Medicine

## 2019-04-22 IMAGING — CR DG KNEE 1-2V*R*
2 series · 2 of 2 positions shown · non-contrast
Comparison: No recent.

CLINICAL DATA: Chronic knee pain.

EXAM:
RIGHT KNEE - 1-2 VIEW

[knee ap]
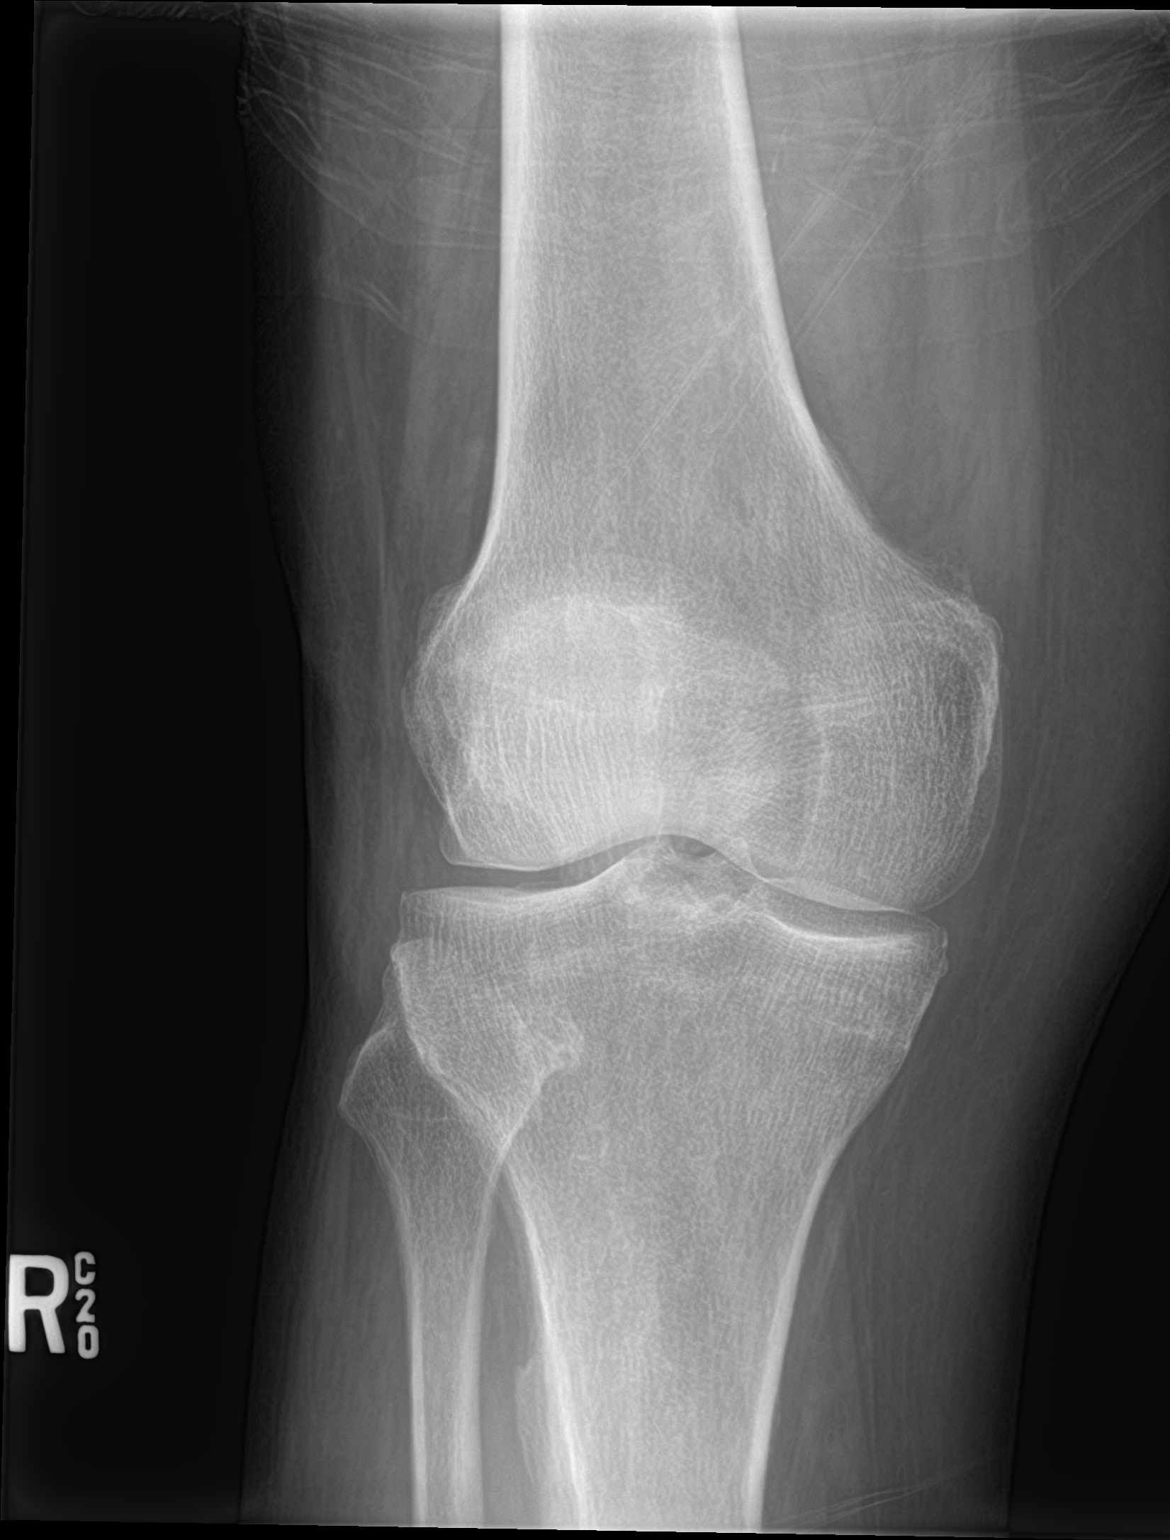

[knee lat]
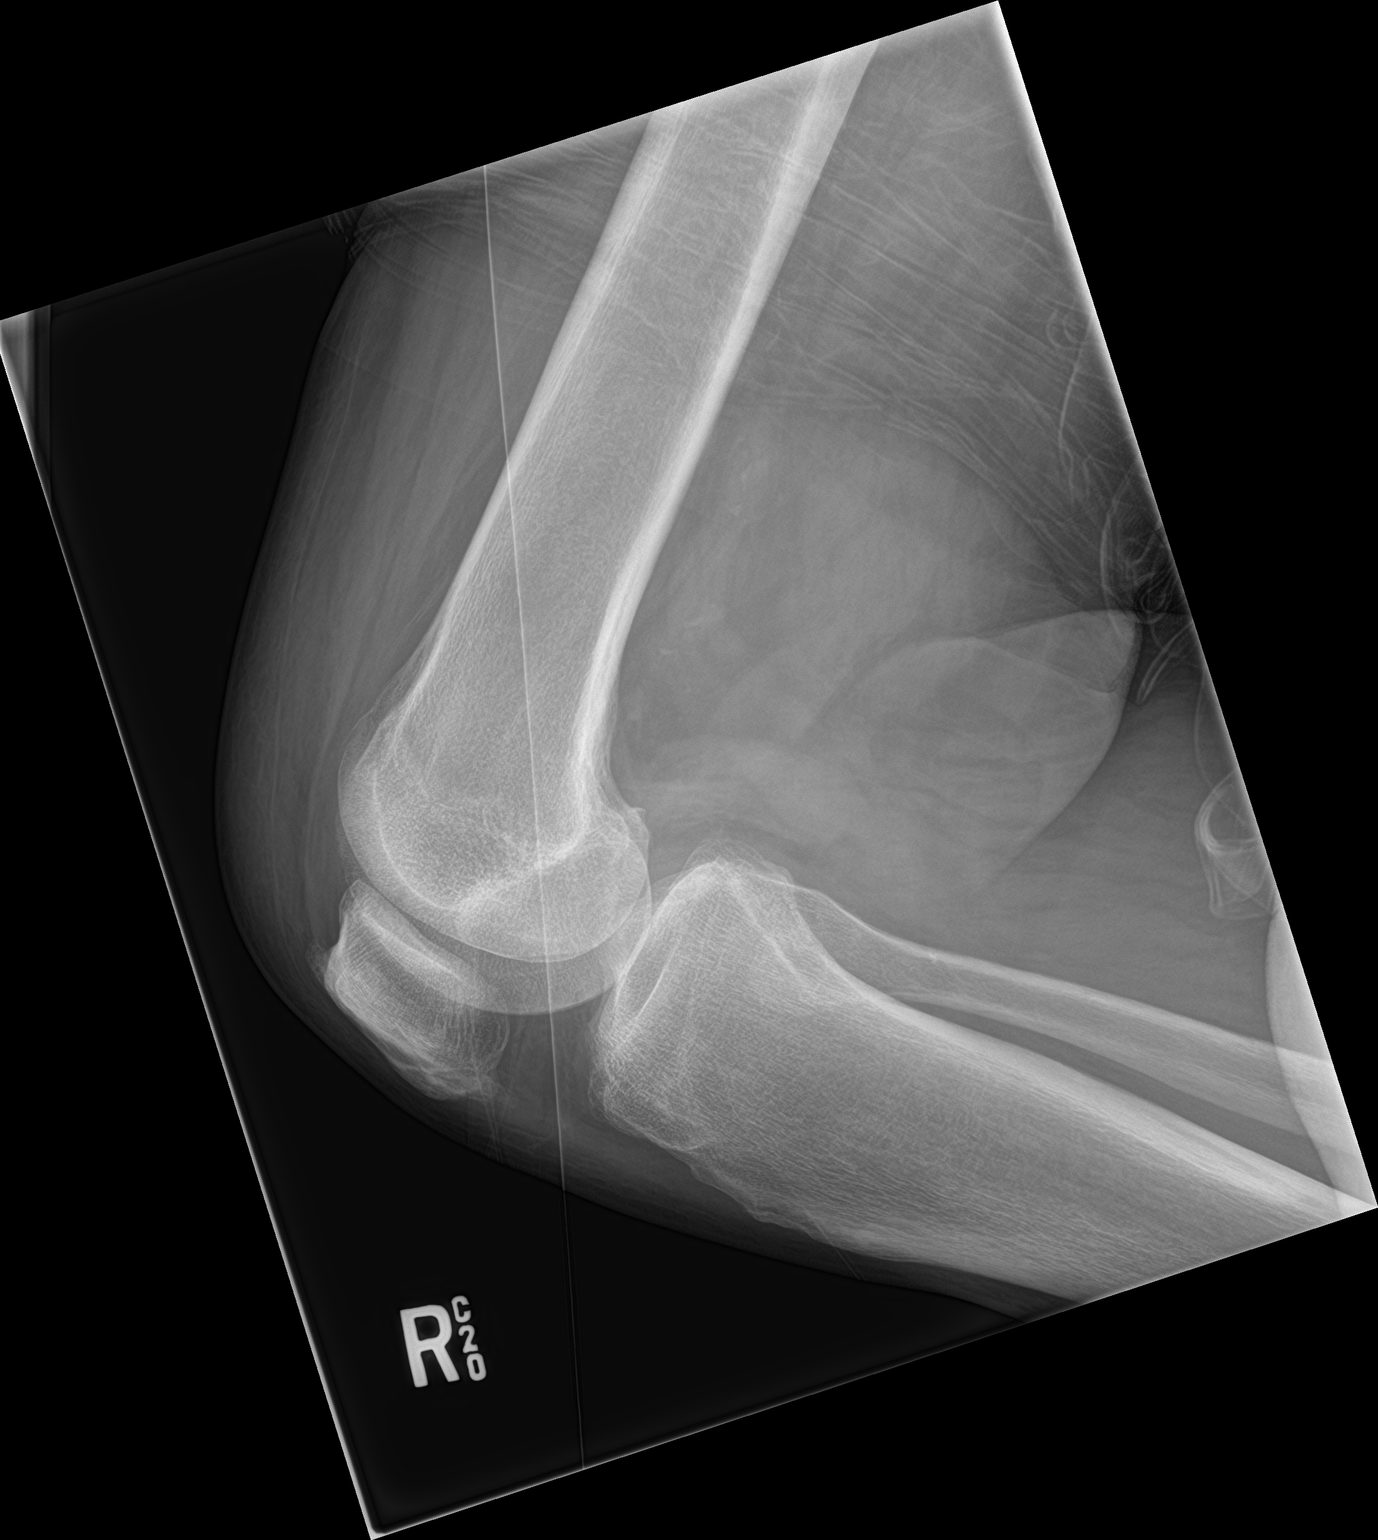

[2 of 2 positions shown; findings below may reference images not displayed]

FINDINGS: Soft tissue structures are unremarkable. Mild patellofemoral and
medial compartment degenerative change. No acute bony abnormality
identified. No evidence of fracture or dislocation. Peripheral
vascular calcification.
IMPRESSION: 1.  Mild patellofemoral and medial compartment degenerative change.

2.  No acute bony abnormality.

3.  Peripheral vascular disease.

## 2019-04-23 ENCOUNTER — Ambulatory Visit: Payer: Medicare Other | Admitting: Pain Medicine

## 2019-04-25 ENCOUNTER — Ambulatory Visit (INDEPENDENT_AMBULATORY_CARE_PROVIDER_SITE_OTHER): Payer: Medicare Other | Admitting: Family Medicine

## 2019-04-25 ENCOUNTER — Encounter: Payer: Self-pay | Admitting: Family Medicine

## 2019-04-25 DIAGNOSIS — R410 Disorientation, unspecified: Secondary | ICD-10-CM

## 2019-04-25 DIAGNOSIS — J432 Centrilobular emphysema: Secondary | ICD-10-CM

## 2019-04-25 DIAGNOSIS — J441 Chronic obstructive pulmonary disease with (acute) exacerbation: Secondary | ICD-10-CM

## 2019-04-25 DIAGNOSIS — J189 Pneumonia, unspecified organism: Secondary | ICD-10-CM

## 2019-04-25 MED ORDER — LEVOFLOXACIN 500 MG PO TABS: 500.0000 mg | ORAL_TABLET | Freq: Every day | ORAL | 0 refills | Status: DC

## 2019-04-25 NOTE — Progress Notes (Signed)
Virtual Visit via Telephone The purpose of this virtual visit is to provide medical care while limiting exposure to the novel coronavirus (COVID19) for both patient and office staff.  Consent was obtained for phone visit:  Yes.   Answered questions that patient had about telehealth interaction:  Yes.   I discussed the limitations, risks, security and privacy concerns of performing an evaluation and management service by telephone. I also discussed with the patient that there may be a patient responsible charge related to this service. The patient expressed understanding and agreed to proceed.  Patient Location: Home Provider Location: [facility] (Office)  ---------------------------------------------------------------------- No chief complaint on file.   S: Reviewed CMA documentation. I have called patient and gathered additional HPI as follows:  Spoke with patient and daughter caregiver, [name].  Follow-up COPD / Hospitalization with AECOPD LLL PNA / Acute on Chronic respiratory Failure Major Depression recurrent See last visit hospital follow-up He has not regained strength and function since that time. He still feels like he may have residual pneumonia - in past he has done better with levaquin. He has had HH evaluation and pulse ox is normal 95% and he denies fever. He admits similar COPD symptoms short of breath and cough without acute flare up but it is persistent draining his energy. - No problem stuttering / stammering prior to going into hospital - now he has had this issue - He was referred to [facility] previously but was unable to follow up with them and schedule - He has had some episodes of confusion with delirium or sundowning usually only brief and confusion with day and night, some poor sleep - They would like to lower dose of Lyrica concern that dose may be too high 150mg  TID, switched already to 150mg  daily - His mood remains fairly stable primarily poor energy  but not endorsing significant depression or suicidal ideation, see PHQ  Denies any high risk travel to areas of current concern for COVID19. Denies any known or suspected exposure to person with or possibly with COVID19.  Denies any fevers, chills, sweats, body ache, sinus pain or pressure, headache, abdominal pain, diarrhea  Past Medical History:  Diagnosis Date  . Anxiety   . Cancer ([place])    skin  . COPD (chronic obstructive pulmonary disease) ([place])   . Glaucoma   . Osteoporosis    osteopenia   Social History   Tobacco Use  . Smoking status: Former Smoker    Years: 15.00  . Smokeless tobacco: Current User    Types: Chew  . Tobacco comment: stopped 15 years ago  Substance Use Topics  . Alcohol use: Yes    Comment: past  . Drug use: Never    Current Outpatient Medications:  .  albuterol (VENTOLIN HFA) 108 (90 Base) MCG/ACT inhaler, Inhale 1-2 puffs into the lungs every 6 (six) hours as needed for wheezing or shortness of breath., Disp: 8 g, Rfl: 0 .  amLODipine (NORVASC) 10 MG tablet, Take 10 mg by mouth daily., Disp: , Rfl:  .  aspirin EC 81 MG tablet, Take 81 mg by mouth daily., Disp: , Rfl:  .  buPROPion (WELLBUTRIN SR) 150 MG 12 hr tablet, Take 150 mg by mouth 2 (two) times daily. , Disp: , Rfl:  .  diclofenac sodium (VOLTAREN) 1 % GEL, Apply 1 application topically as needed., Disp: , Rfl:  .  DULoxetine (CYMBALTA) 60 MG capsule, Take 60 mg by mouth daily. , Disp: , Rfl:  .  fenofibrate (TRICOR) 145 MG tablet, Take 145 mg by mouth daily., Disp: , Rfl:  .  ferrous sulfate 325 (65 FE) MG tablet, Take 325 mg by mouth daily with breakfast., Disp: , Rfl:  .  meloxicam (MOBIC) 15 MG tablet, Take 1 tablet (15 mg total) by mouth daily., Disp: 30 tablet, Rfl: 5 .  Multiple Vitamin (MULTIVITAMIN WITH MINERALS) TABS tablet, Take 1 tablet by mouth daily., Disp: , Rfl:  .  [START ON 04/27/2019] Oxycodone HCl 10 MG TABS, Take 1 tablet (10 mg total) by mouth 3 (three) times daily as  needed. Must last 30 days., Disp: 90 tablet, Rfl: 0 .  [START ON 05/27/2019] Oxycodone HCl 10 MG TABS, Take 1 tablet (10 mg total) by mouth 3 (three) times daily as needed. Must last 30 days., Disp: 90 tablet, Rfl: 0 .  [START ON 06/26/2019] Oxycodone HCl 10 MG TABS, Take 1 tablet (10 mg total) by mouth 3 (three) times daily as needed. Must last 30 days., Disp: 90 tablet, Rfl: 0 .  pantoprazole (PROTONIX) 40 MG tablet, Take 40 mg by mouth daily. , Disp: , Rfl:  .  prednisoLONE acetate (PRED FORTE) 1 % ophthalmic suspension, Place 2 drops into the left eye daily., Disp: 5 mL, Rfl: 0 .  pregabalin (LYRICA) 150 MG capsule, Take 1 capsule (150 mg total) by mouth 3 (three) times daily. Must last 30 days, Disp: 90 capsule, Rfl: 5 .  QUEtiapine (SEROQUEL) 100 MG tablet, Take 100 mg by mouth at bedtime. , Disp: , Rfl:  .  timolol (TIMOPTIC) 0.5 % ophthalmic solution, , Disp: , Rfl:  .  traZODone (DESYREL) 50 MG tablet, Take 50 mg by mouth at bedtime. , Disp: , Rfl:  .  umeclidinium-vilanterol (ANORO ELLIPTA) 62.5-25 MCG/INH AEPB, Inhale 1 puff into the lungs daily., Disp: , Rfl:  .  levofloxacin (LEVAQUIN) 500 MG tablet, Take 1 tablet (500 mg total) by mouth daily. For 7 days, Disp: 7 tablet, Rfl: 0  Depression screen [facility] 2/9 04/25/2019 03/13/2019 12/17/2018  Decreased Interest 1 0 0  Down, Depressed, Hopeless 1 0 0  PHQ - 2 Score 2 0 0  Altered sleeping 1 - -  Tired, decreased energy 3 - -  Change in appetite 2 - -  Feeling bad or failure about yourself  0 - -  Trouble concentrating 0 - -  Moving slowly or fidgety/restless 0 - -  Suicidal thoughts 0 - -  PHQ-9 Score 8 - -  Difficult doing work/chores Somewhat difficult - -    GAD 7 : Generalized Anxiety Score 04/11/2019 07/12/2018 05/01/2018  Nervous, Anxious, on Edge (No Data) 1 0  Control/stop worrying - 1 0  Worry too much - different things - 1 0  Trouble relaxing - 1 0  Restless - 0 0  Easily annoyed or irritable - 0 0  Afraid - awful might  happen - 1 0  Total GAD 7 Score - 5 0  Anxiety Difficulty - Somewhat difficult Not difficult at all    -------------------------------------------------------------------------- O: No physical exam performed due to remote telephone encounter.  Lab results reviewed.  Recent Results (from the past 2160 hour(s))  CBC with Differential     Status: Abnormal   Collection Time: 02/01/19  9:31 AM  Result Value Ref Range   WBC 3.5 (L) 4.0 - 10.5 K/uL   RBC 4.40 4.22 - 5.81 MIL/uL   Hemoglobin 13.2 13.0 - 17.0 g/dL   HCT 41.0 39.0 - 52.0 %  MCV 93.2 80.0 - 100.0 fL   MCH 30.0 26.0 - 34.0 pg   MCHC 32.2 30.0 - 36.0 g/dL   RDW 16.7 (H) 11.5 - 15.5 %   Platelets 127 (L) 150 - 400 K/uL    Comment: Immature Platelet Fraction may be clinically indicated, consider ordering this additional test [id number]    nRBC 0.0 0.0 - 0.2 %   Neutrophils Relative % 46 %   Neutro Abs 1.6 (L) 1.7 - 7.7 K/uL   Lymphocytes Relative 20 %   Lymphs Abs 0.7 0.7 - 4.0 K/uL   Monocytes Relative 19 %   Monocytes Absolute 0.7 0.1 - 1.0 K/uL   Eosinophils Relative 14 %   Eosinophils Absolute 0.5 0.0 - 0.5 K/uL   Basophils Relative 1 %   Basophils Absolute 0.0 0.0 - 0.1 K/uL   Immature Granulocytes 0 %   Abs Immature Granulocytes 0.01 0.00 - 0.07 K/uL    Comment: Performed at [facility], [address], [place], [place] [address]  Basic metabolic panel     Status: Abnormal   Collection Time: 02/01/19  9:31 AM  Result Value Ref Range   Sodium 139 135 - 145 mmol/L   Potassium 3.8 3.5 - 5.1 mmol/L   Chloride 105 98 - 111 mmol/L   CO2 24 22 - 32 mmol/L   Glucose, Bld 151 (H) 70 - 99 mg/dL   BUN 13 8 - 23 mg/dL   Creatinine, Ser 0.93 0.61 - 1.24 mg/dL   Calcium 8.6 (L) 8.9 - 10.3 mg/dL   GFR calc non Af Amer >60 >60 mL/min   GFR calc Af Amer >60 >60 mL/min   Anion gap 10 5 - 15    Comment: Performed at [facility], [address] [place], [place] [address]  Magnesium     Status:  None   Collection Time: 02/01/19  9:31 AM  Result Value Ref Range   Magnesium 2.0 1.7 - 2.4 mg/dL    Comment: Performed at [facility], [address] [place], [place] [address]  Basic metabolic panel     Status: Abnormal   Collection Time: 02/13/19  5:48 PM  Result Value Ref Range   Sodium 142 135 - 145 mmol/L   Potassium 4.3 3.5 - 5.1 mmol/L   Chloride 108 98 - 111 mmol/L   CO2 21 (L) 22 - 32 mmol/L   Glucose, Bld 251 (H) 70 - 99 mg/dL   BUN 22 8 - 23 mg/dL   Creatinine, Ser 0.91 0.61 - 1.24 mg/dL   Calcium 9.2 8.9 - 10.3 mg/dL   GFR calc non Af Amer >60 >60 mL/min   GFR calc Af Amer >60 >60 mL/min   Anion gap 13 5 - 15    Comment: Performed at [facility], [address], [place], [place] [address]  Brain natriuretic peptide     Status: None   Collection Time: 02/13/19  5:48 PM  Result Value Ref Range   B Natriuretic Peptide 35.0 0.0 - 100.0 pg/mL    Comment: Performed at [facility], [place], [place] [address]  Troponin I (High Sensitivity)     Status: None   Collection Time: 02/13/19  5:48 PM  Result Value Ref Range   Troponin I (High Sensitivity) 15 <18 ng/L    Comment: (NOTE) Elevated high sensitivity troponin I (hsTnI) values and significant  changes across serial measurements may suggest ACS but many other  chronic and acute conditions are known to elevate hsTnI results.  Refer to the "Links" section for chest pain algorithms and additional  guidance. Performed at [facility], [address] [place], [place] [address]   CBC with Differential     Status: Abnormal   Collection Time: 02/13/19  5:48 PM  Result Value Ref Range   WBC 5.3 4.0 - 10.5 K/uL   RBC 4.91 4.22 - 5.81 MIL/uL   Hemoglobin 14.9 13.0 - 17.0 g/dL   HCT 45.2 39.0 - 52.0 %   MCV 92.1 80.0 - 100.0 fL   MCH 30.3 26.0 - 34.0 pg   MCHC 33.0 30.0 - 36.0 g/dL   RDW 16.2 (H) 11.5 - 15.5 %   Platelets 166 150 - 400 K/uL   nRBC 0.0 0.0 -  0.2 %   Neutrophils Relative % 86 %   Neutro Abs 4.5 1.7 - 7.7 K/uL   Lymphocytes Relative 9 %   Lymphs Abs 0.5 (L) 0.7 - 4.0 K/uL   Monocytes Relative 3 %   Monocytes Absolute 0.2 0.1 - 1.0 K/uL   Eosinophils Relative 0 %   Eosinophils Absolute 0.0 0.0 - 0.5 K/uL   Basophils Relative 1 %   Basophils Absolute 0.0 0.0 - 0.1 K/uL   Immature Granulocytes 1 %   Abs Immature Granulocytes 0.06 0.00 - 0.07 K/uL    Comment: Performed at [facility], [address] [place], [place] [address]  SARS Coronavirus 2 [facility] order, Performed in [facility] hospital lab) Nasopharyngeal Nasopharyngeal Swab     Status: None   Collection Time: 02/13/19  5:48 PM   Specimen: Nasopharyngeal Swab  Result Value Ref Range   SARS Coronavirus 2 NEGATIVE NEGATIVE    Comment: (NOTE) If result is NEGATIVE SARS-CoV-2 target nucleic acids are NOT DETECTED. The SARS-CoV-2 RNA is generally detectable in upper and lower  respiratory specimens during the acute phase of infection. The lowest  concentration of SARS-CoV-2 viral copies this assay can detect is 250  copies / mL. A negative result does not preclude SARS-CoV-2 infection  and should not be used as the sole basis for treatment or other  patient management decisions.  A negative result may occur with  improper specimen collection / handling, submission of specimen other  than nasopharyngeal swab, presence of viral mutation(s) within the  areas targeted by this assay, and inadequate number of viral copies  (<250 copies / mL). A negative result must be combined with clinical  observations, patient history, and epidemiological information. If result is POSITIVE SARS-CoV-2 target nucleic acids are DETECTED. The SARS-CoV-2 RNA is generally detectable in upper and lower  respiratory specimens dur ing the acute phase of infection.  Positive  results are indicative of active infection with SARS-CoV-2.  Clinical  correlation with patient history and  other diagnostic information is  necessary to determine patient infection status.  Positive results do  not rule out bacterial infection or co-infection with other viruses. If result is PRESUMPTIVE POSTIVE SARS-CoV-2 nucleic acids MAY BE PRESENT.   A presumptive positive result was obtained on the submitted specimen  and confirmed on repeat testing.  While 2019 novel coronavirus  (SARS-CoV-2) nucleic acids may be present in the submitted sample  additional confirmatory testing may be necessary for epidemiological  and / or clinical management purposes  to differentiate between  SARS-CoV-2 and other Sarbecovirus currently known to infect humans.  If clinically indicated additional testing with an alternate test  methodology [phone]) is  advised. The SARS-CoV-2 RNA is generally  detectable in upper and lower respiratory sp ecimens during the acute  phase of infection. The expected result is Negative. Fact Sheet for Patients:  StrictlyIdeas.no Fact Sheet for Healthcare Providers: BankingDealers.co.za This test is not yet approved or cleared by the [place] FDA and has been authorized for detection and/or diagnosis of SARS-CoV-2 by FDA under an Emergency Use Authorization (EUA).  This EUA will remain in effect (meaning this test can be used) for the duration of the COVID-19 declaration under Section 564(b)(1) of the Act, 21 U.S.C. section 360bbb-3(b)(1), unless the authorization is terminated or revoked sooner. Performed at [facility], [address] [place], [place] [address]   Ethanol     Status: None   Collection Time: 02/13/19  5:48 PM  Result Value Ref Range   Alcohol, Ethyl (B) <10 <10 mg/dL    Comment: (NOTE) Lowest detectable limit for serum alcohol is 10 mg/dL. For medical purposes only. Performed at [facility], [place], [place] [address]   Troponin I (High Sensitivity)     Status:  Abnormal   Collection Time: 02/13/19  7:45 PM  Result Value Ref Range   Troponin I (High Sensitivity) 19 (H) <18 ng/L    Comment: (NOTE) Elevated high sensitivity troponin I (hsTnI) values and significant  changes across serial measurements may suggest ACS but many other  chronic and acute conditions are known to elevate hsTnI results.  Refer to the "Links" section for chest pain algorithms and additional  guidance. Performed at [facility], [address] [place], [place] [id number]   Basic metabolic panel     Status: Abnormal   Collection Time: 02/14/19 12:21 AM  Result Value Ref Range   Sodium 141 135 - 145 mmol/L   Potassium 4.2 3.5 - 5.1 mmol/L   Chloride 107 98 - 111 mmol/L   CO2 26 22 - 32 mmol/L   Glucose, Bld 105 (H) 70 - 99 mg/dL   BUN 23 8 - 23 mg/dL   Creatinine, Ser 0.81 0.61 - 1.24 mg/dL   Calcium 9.1 8.9 - 10.3 mg/dL   GFR calc non Af Amer >60 >60 mL/min   GFR calc Af Amer >60 >60 mL/min   Anion gap 8 5 - 15    Comment: Performed at [facility], [address] [place], [place] [address]  CBC     Status: Abnormal   Collection Time: 02/14/19 12:21 AM  Result Value Ref Range   WBC 6.1 4.0 - 10.5 K/uL   RBC 4.55 4.22 - 5.81 MIL/uL   Hemoglobin 13.7 13.0 - 17.0 g/dL   HCT 41.9 39.0 - 52.0 %   MCV 92.1 80.0 - 100.0 fL   MCH 30.1 26.0 - 34.0 pg   MCHC 32.7 30.0 - 36.0 g/dL   RDW 16.1 (H) 11.5 - 15.5 %   Platelets 134 (L) 150 - 400 K/uL   nRBC 0.0 0.0 - 0.2 %    Comment: Performed at [facility], [place], [place] [address]  Troponin I (High Sensitivity)     Status: Abnormal   Collection Time: 02/14/19 12:21 AM  Result Value Ref Range   Troponin I (High Sensitivity) 21 (H) <18 ng/L    Comment: (NOTE) Elevated high sensitivity troponin I (hsTnI) values and significant  changes across serial measurements may suggest ACS but many other  chronic and acute conditions are known to elevate hsTnI results.  Refer  to the  "Links" section for chest pain algorithms and additional  guidance. Performed at [facility], [address] [place], [place] [address]   Hemoglobin A1c     Status: Abnormal   Collection Time: 02/14/19 12:21 AM  Result Value Ref Range   Hgb A1c MFr Bld 5.9 (H) 4.8 - 5.6 %    Comment: (NOTE) Pre diabetes:          5.7%-6.4% Diabetes:              >6.4% Glycemic control for   <7.0% adults with diabetes    Mean Plasma Glucose 122.63 mg/dL    Comment: Performed at [facility] [address] [place], [place] [address]  Glucose, capillary     Status: Abnormal   Collection Time: 02/14/19  8:39 AM  Result Value Ref Range   Glucose-Capillary 123 (H) 70 - 99 mg/dL  ECHOCARDIOGRAM COMPLETE     Status: None   Collection Time: 02/14/19 11:32 AM  Result Value Ref Range   Weight 4,131.2 oz   Height 69 in   BP 166/90 mmHg  Glucose, capillary     Status: Abnormal   Collection Time: 02/14/19 11:38 AM  Result Value Ref Range   Glucose-Capillary 109 (H) 70 - 99 mg/dL  Glucose, capillary     Status: Abnormal   Collection Time: 02/14/19  3:58 PM  Result Value Ref Range   Glucose-Capillary 129 (H) 70 - 99 mg/dL  Glucose, capillary     Status: Abnormal   Collection Time: 02/14/19  9:21 PM  Result Value Ref Range   Glucose-Capillary 135 (H) 70 - 99 mg/dL  Glucose, capillary     Status: Abnormal   Collection Time: 02/15/19  7:43 AM  Result Value Ref Range   Glucose-Capillary 112 (H) 70 - 99 mg/dL  Glucose, capillary     Status: Abnormal   Collection Time: 02/15/19 11:39 AM  Result Value Ref Range   Glucose-Capillary 129 (H) 70 - 99 mg/dL  Comprehensive metabolic panel     Status: Abnormal   Collection Time: 03/28/19  9:32 PM  Result Value Ref Range   Sodium 140 135 - 145 mmol/L   Potassium 4.5 3.5 - 5.1 mmol/L   Chloride 109 98 - 111 mmol/L   CO2 19 (L) 22 - 32 mmol/L   Glucose, Bld 145 (H) 70 - 99 mg/dL   BUN 33 (H) 8 - 23 mg/dL   Creatinine, Ser 1.21 0.61 - 1.24  mg/dL   Calcium 9.3 8.9 - 10.3 mg/dL   Total Protein 7.4 6.5 - 8.1 g/dL   Albumin 3.4 (L) 3.5 - 5.0 g/dL   AST 52 (H) 15 - 41 U/L   ALT 45 (H) 0 - 44 U/L   Alkaline Phosphatase 51 38 - 126 U/L   Total Bilirubin 0.8 0.3 - 1.2 mg/dL   GFR calc non Af Amer 55 (L) >60 mL/min   GFR calc Af Amer >60 >60 mL/min   Anion gap 12 5 - 15    Comment: Performed at [facility], [address] [place], [place] [address]  CBC with Differential     Status: Abnormal   Collection Time: 03/28/19  9:32 PM  Result Value Ref Range   WBC 7.7 4.0 - 10.5 K/uL   RBC 4.51 4.22 - 5.81 MIL/uL   Hemoglobin 13.3 13.0 - 17.0 g/dL   HCT 41.7 39.0 - 52.0 %   MCV 92.5 80.0 - 100.0 fL  MCH 29.5 26.0 - 34.0 pg   MCHC 31.9 30.0 - 36.0 g/dL   RDW 15.9 (H) 11.5 - 15.5 %   Platelets 179 150 - 400 K/uL   nRBC 0.0 0.0 - 0.2 %   Neutrophils Relative % 63 %   Neutro Abs 4.8 1.7 - 7.7 K/uL   Lymphocytes Relative 18 %   Lymphs Abs 1.4 0.7 - 4.0 K/uL   Monocytes Relative 13 %   Monocytes Absolute 1.0 0.1 - 1.0 K/uL   Eosinophils Relative 5 %   Eosinophils Absolute 0.4 0.0 - 0.5 K/uL   Basophils Relative 0 %   Basophils Absolute 0.0 0.0 - 0.1 K/uL   Immature Granulocytes 1 %   Abs Immature Granulocytes 0.04 0.00 - 0.07 K/uL    Comment: Performed at [facility], [place], [place] [address]  Troponin I (High Sensitivity)     Status: None   Collection Time: 03/28/19  9:32 PM  Result Value Ref Range   Troponin I (High Sensitivity) 11 <18 ng/L    Comment: (NOTE) Elevated high sensitivity troponin I (hsTnI) values and significant  changes across serial measurements may suggest ACS but many other  chronic and acute conditions are known to elevate hsTnI results.  Refer to the "Links" section for chest pain algorithms and additional  guidance. Performed at [facility], [address] [place], [place] [address]   Brain natriuretic peptide     Status: None   Collection Time:  03/28/19  9:32 PM  Result Value Ref Range   B Natriuretic Peptide 52.0 0.0 - 100.0 pg/mL    Comment: Performed at [facility], [address] [place], [place] [address]  Lactic acid, plasma     Status: None   Collection Time: 03/28/19  9:32 PM  Result Value Ref Range   Lactic Acid, Venous 1.9 0.5 - 1.9 mmol/L    Comment: Performed at [facility], [address] [place], [place] [address]  Urinalysis, Complete w Microscopic     Status: Abnormal   Collection Time: 03/28/19 10:26 PM  Result Value Ref Range   Color, Urine YELLOW (A) YELLOW   APPearance HAZY (A) CLEAR   Specific Gravity, Urine 1.025 1.005 - 1.030   pH 5.0 5.0 - 8.0   Glucose, UA NEGATIVE NEGATIVE mg/dL   Hgb urine dipstick NEGATIVE NEGATIVE   Bilirubin Urine NEGATIVE NEGATIVE   Ketones, ur NEGATIVE NEGATIVE mg/dL   Protein, ur 30 (A) NEGATIVE mg/dL   Nitrite NEGATIVE NEGATIVE   Leukocytes,Ua MODERATE (A) NEGATIVE   RBC / HPF 0-5 0 - 5 RBC/hpf   WBC, UA >50 (H) 0 - 5 WBC/hpf   Bacteria, UA NONE SEEN NONE SEEN   Squamous Epithelial / LPF 0-5 0 - 5   Mucus PRESENT     Comment: Performed at [facility], [address] [place], [place] [address]  Legionella Pneumophila Serogp 1 Ur Ag     Status: None   Collection Time: 03/28/19 10:26 PM  Result Value Ref Range   L. pneumophila Serogp 1 Ur Ag Negative Negative    Comment: (NOTE) Presumptive negative for L. pneumophila serogroup 1 antigen in urine, suggesting no recent or current infection. Legionnaires' disease cannot be ruled out since other serogroups and species may also cause disease. Performed At: [facility] [place], [place] [id number] Rush Farmer MD [id number]    Source of Sample URINE, RANDOM     Comment: Performed at  [address] [place], [place] [address]  Strep pneumoniae urinary antigen  (not at [facility])     Status: None   Collection Time: 03/28/19 10:26 PM  Result  Value Ref Range   Strep Pneumo Urinary Antigen NEGATIVE NEGATIVE    Comment: Performed at [facility] [address] [place], [place] [address]  SARS Coronavirus 2 by RT PCR (hospital order, performed in [facility] hospital lab) Nasopharyngeal Nasopharyngeal Swab     Status: None   Collection Time: 03/28/19 10:52 PM   Specimen: Nasopharyngeal Swab  Result Value Ref Range   SARS Coronavirus 2 NEGATIVE NEGATIVE    Comment: (NOTE) If result is NEGATIVE SARS-CoV-2 target nucleic acids are NOT DETECTED. The SARS-CoV-2 RNA is generally detectable in upper and lower  respiratory specimens during the acute phase of infection. The lowest  concentration of SARS-CoV-2 viral copies this assay can detect is 250  copies / mL. A negative result does not preclude SARS-CoV-2 infection  and should not be used as the sole basis for treatment or other  patient management decisions.  A negative result may occur with  improper specimen collection / handling, submission of specimen other  than nasopharyngeal swab, presence of viral mutation(s) within the  areas targeted by this assay, and inadequate number of viral copies  (<250 copies / mL). A negative result must be combined with clinical  observations, patient history, and epidemiological information. If result is POSITIVE SARS-CoV-2 target nucleic acids are DETECTED. The SARS-CoV-2 RNA is generally detectable in upper and lower  respiratory specimens dur ing the acute phase of infection.  Positive  results are indicative of active infection with SARS-CoV-2.  Clinical  correlation with patient history and other diagnostic information is  necessary to determine patient infection status.  Positive results do  not rule out bacterial infection or co-infection with other viruses. If result is PRESUMPTIVE POSTIVE SARS-CoV-2 nucleic acids MAY BE PRESENT.   A presumptive positive result was obtained on the submitted specimen  and confirmed on repeat  testing.  While 2019 novel coronavirus  (SARS-CoV-2) nucleic acids may be present in the submitted sample  additional confirmatory testing may be necessary for epidemiological  and / or clinical management purposes  to differentiate between  SARS-CoV-2 and other Sarbecovirus currently known to infect humans.  If clinically indicated additional testing with an alternate test  methodology [phone]) is advised. The SARS-CoV-2 RNA is generally  detectable in upper and lower respiratory sp ecimens during the acute  phase of infection. The expected result is Negative. Fact Sheet for Patients:  StrictlyIdeas.no Fact Sheet for Healthcare Providers: BankingDealers.co.za This test is not yet approved or cleared by the [place] FDA and has been authorized for detection and/or diagnosis of SARS-CoV-2 by FDA under an Emergency Use Authorization (EUA).  This EUA will remain in effect (meaning this test can be used) for the duration of the COVID-19 declaration under Section 564(b)(1) of the Act, 21 U.S.C. section 360bbb-3(b)(1), unless the authorization is terminated or revoked sooner. Performed at [facility], [address] [place], [place] [address]   Blood gas, arterial [facility] ONLY)     Status: Abnormal   Collection Time: 03/29/19 12:24 AM  Result Value Ref Range   FIO2 0.50    VT 500 mL   Peep/cpap 5.0 cm H20   pH, Arterial 7.28 (L) 7.350 - 7.450   pCO2 arterial 47 32.0 - 48.0 mmHg   pO2, Arterial 113 (  H) 83.0 - 108.0 mmHg   Bicarbonate 22.1 20.0 - 28.0 mmol/L   Acid-base deficit 4.8 (H) 0.0 - 2.0 mmol/L   O2 Saturation 97.8 %   Patient temperature 37.0    Collection site RIGHT RADIAL    Sample type ARTERIAL DRAW    Allens test (pass/fail) PASS PASS   Mechanical Rate 20     Comment: Performed at [facility], [address] [place], [place] [address]  Glucose, capillary     Status: Abnormal   Collection Time:  03/29/19  1:56 AM  Result Value Ref Range   Glucose-Capillary 178 (H) 70 - 99 mg/dL  Blood gas, arterial     Status: Abnormal   Collection Time: 03/29/19  2:04 AM  Result Value Ref Range   FIO2 0.50    Delivery systems VENTILATOR    Mode PRESSURE REGULATED VOLUME CONTROL    VT 500 mL   Peep/cpap 5.0 cm H20   pH, Arterial 7.38 7.350 - 7.450   pCO2 arterial 38 32.0 - 48.0 mmHg   pO2, Arterial 135 (H) 83.0 - 108.0 mmHg   Bicarbonate 22.5 20.0 - 28.0 mmol/L   Acid-base deficit 2.3 (H) 0.0 - 2.0 mmol/L   O2 Saturation 99.0 %   Patient temperature 37.0    Collection site RIGHT RADIAL    Sample type ARTERIAL DRAW    Allens test (pass/fail) PASS PASS   Mechanical Rate 20     Comment: Performed at [facility], [address] [employer], [place] [address]  Procalcitonin     Status: None   Collection Time: 03/29/19  2:21 AM  Result Value Ref Range   Procalcitonin 0.26 ng/mL    Comment:        Interpretation: PCT (Procalcitonin) <= 0.5 ng/mL: Systemic infection (sepsis) is not likely. Local bacterial infection is possible. (NOTE)       Sepsis PCT Algorithm           Lower Respiratory Tract                                      Infection PCT Algorithm    ----------------------------     ----------------------------         PCT < 0.25 ng/mL                PCT < 0.10 ng/mL         Strongly encourage             Strongly discourage   discontinuation of antibiotics    initiation of antibiotics    ----------------------------     -----------------------------       PCT 0.25 - 0.50 ng/mL            PCT 0.10 - 0.25 ng/mL               OR       >80% decrease in PCT            Discourage initiation of                                            antibiotics      Encourage discontinuation           of antibiotics    ----------------------------     -----------------------------  PCT >= 0.50 ng/mL              PCT 0.26 - 0.50 ng/mL               AND        <80% decrease in PCT              Encourage initiation of                                             antibiotics       Encourage continuation           of antibiotics    ----------------------------     -----------------------------        PCT >= 0.50 ng/mL                  PCT > 0.50 ng/mL               AND         increase in PCT                  Strongly encourage                                      initiation of antibiotics    Strongly encourage escalation           of antibiotics                                     -----------------------------                                           PCT <= 0.25 ng/mL                                                 OR                                        > 80% decrease in PCT                                     Discontinue / Do not initiate                                             antibiotics Performed at [facility], [address] [place], [place] [address]   CBC     Status: Abnormal   Collection Time: 03/29/19  2:21 AM  Result Value Ref Range   WBC 7.1 4.0 - 10.5 K/uL   RBC 4.54 4.22 - 5.81 MIL/uL   Hemoglobin 13.4 13.0 - 17.0 g/dL   HCT 42.5 39.0 - 52.0 %  MCV 93.6 80.0 - 100.0 fL   MCH 29.5 26.0 - 34.0 pg   MCHC 31.5 30.0 - 36.0 g/dL   RDW 15.9 (H) 11.5 - 15.5 %   Platelets 169 150 - 400 K/uL   nRBC 0.0 0.0 - 0.2 %    Comment: Performed at [facility], [name] [place], [place] [id number]  Basic metabolic panel     Status: Abnormal   Collection Time: 03/29/19  2:21 AM  Result Value Ref Range   Sodium 142 135 - 145 mmol/L   Potassium 4.5 3.5 - 5.1 mmol/L   Chloride 113 (H) 98 - 111 mmol/L   CO2 21 (L) 22 - 32 mmol/L   Glucose, Bld 179 (H) 70 - 99 mg/dL   BUN 35 (H) 8 - 23 mg/dL   Creatinine, Ser 1.14 0.61 - 1.24 mg/dL   Calcium 9.3 8.9 - 10.3 mg/dL   GFR calc non Af Amer 60 (L) >60 mL/min   GFR calc Af Amer >60 >60 mL/min   Anion gap 8 5 - 15    Comment: Performed at [facility], [address] [place], [place] [address]  HIV Antibody (routine testing w rflx)     Status: None   Collection Time: 03/29/19  2:21 AM  Result Value Ref Range   HIV Screen 4th Generation wRfx NON REACTIVE NON REACTIVE    Comment: Performed at [facility] [address] [place], [place] [address]  Triglycerides     Status: Abnormal   Collection Time: 03/29/19  2:21 AM  Result Value Ref Range   Triglycerides 187 (H) <150 mg/dL    Comment: Performed at [facility], [address] [name], [place] [address]  Magnesium     Status: None   Collection Time: 03/29/19  2:21 AM  Result Value Ref Range   Magnesium 2.1 1.7 - 2.4 mg/dL    Comment: Performed at [facility], [address] [place], [place] [address]  Phosphorus     Status: None   Collection Time: 03/29/19  2:21 AM  Result Value Ref Range   Phosphorus 4.0 2.5 - 4.6 mg/dL    Comment: Performed at [facility], [address] [place], [place] [address]  MRSA PCR Screening     Status: None   Collection Time: 03/29/19  2:50 AM   Specimen: Nasopharyngeal  Result Value Ref Range   MRSA by PCR NEGATIVE NEGATIVE    Comment:        The GeneXpert MRSA Assay (FDA approved for NASAL specimens only), is one component of a comprehensive MRSA colonization surveillance program. It is not intended to diagnose MRSA infection nor to guide or monitor treatment for MRSA infections. Performed at [facility], [address] [place],  [address]   Urine Drug Screen, Qualitative [facility] only)     Status: Abnormal   Collection Time: 03/29/19  3:00 AM  Result Value Ref Range   Tricyclic, Ur Screen POSITIVE (A) NONE DETECTED   Amphetamines, Ur Screen NONE DETECTED NONE DETECTED   MDMA (Ecstasy)Ur Screen NONE DETECTED NONE DETECTED   Cocaine Metabolite,Ur [place] NONE DETECTED NONE DETECTED   Opiate, Ur Screen POSITIVE (A) NONE DETECTED   Phencyclidine (PCP) Ur S NONE DETECTED NONE DETECTED   Cannabinoid 50  Ng, Ur [place] NONE DETECTED NONE DETECTED   Barbiturates, Ur Screen NONE DETECTED NONE DETECTED   Benzodiazepine, Ur Scrn POSITIVE (A) NONE DETECTED   Methadone Scn, Ur  NONE DETECTED NONE DETECTED    Comment: (NOTE) Tricyclics + metabolites, urine    Cutoff 1000 ng/mL Amphetamines + metabolites, urine  Cutoff 1000 ng/mL MDMA (Ecstasy), urine              Cutoff 500 ng/mL Cocaine Metabolite, urine          Cutoff 300 ng/mL Opiate + metabolites, urine        Cutoff 300 ng/mL Phencyclidine (PCP), urine         Cutoff 25 ng/mL Cannabinoid, urine                 Cutoff 50 ng/mL Barbiturates + metabolites, urine  Cutoff 200 ng/mL Benzodiazepine, urine              Cutoff 200 ng/mL Methadone, urine                   Cutoff 300 ng/mL The urine drug screen provides only a preliminary, unconfirmed analytical test result and should not be used for non-medical purposes. Clinical consideration and professional judgment should be applied to any positive drug screen result due to possible interfering substances. A more specific alternate chemical method must be used in order to obtain a confirmed analytical result. Gas chromatography / mass spectrometry (GC/MS) is the preferred confirmat ory method. Performed at [facility], [address], [place], [place] [address]   Urine Culture     Status: None   Collection Time: 03/29/19  3:00 AM   Specimen: Urine, Random  Result Value Ref Range   Specimen Description      URINE, RANDOM Performed at [facility], [address] [place], [place] [address]    Special Requests      NONE Performed at [facility] [address], [place], [place] [address]    Culture      NO GROWTH Performed at [facility], [place] [address] [place], [place] [address]    Report Status 03/30/2019 FINAL   Culture, blood (Routine X 2) w Reflex to ID Panel     Status: None   Collection Time: 03/29/19  6:33 AM   Specimen: BLOOD  Result  Value Ref Range   Specimen Description BLOOD R HAND    Special Requests      BOTTLES DRAWN AEROBIC ONLY Blood Culture adequate volume   Culture      NO GROWTH 5 DAYS Performed at [facility], [address] [place], [place] [address]    Report Status 04/03/2019 FINAL   Culture, blood (Routine X 2) w Reflex to ID Panel     Status: None   Collection Time: 03/29/19  6:46 AM   Specimen: BLOOD  Result Value Ref Range   Specimen Description BLOOD RFOA    Special Requests      BOTTLES DRAWN AEROBIC AND ANAEROBIC Blood Culture adequate volume   Culture      NO GROWTH 5 DAYS Performed at [facility], [address] [place], [place] [address]    Report Status 04/03/2019 FINAL   Glucose, capillary     Status: Abnormal   Collection Time: 03/29/19  8:01 AM  Result Value Ref Range   Glucose-Capillary 119 (H) 70 - 99 mg/dL  Culture, respiratory (non-expectorated)     Status: None   Collection Time: 03/29/19  2:45 PM   Specimen: Tracheal Aspirate; Respiratory  Result Value Ref Range   Specimen Description      TRACHEAL ASPIRATE Performed  at [facility], [address] [place], [place] [address]    Special Requests      NONE Performed at [facility], [place], [place] [address]    Gram Stain      MODERATE WBC PRESENT, PREDOMINANTLY PMN MODERATE GRAM POSITIVE COCCI IN CLUSTERS RARE GRAM NEGATIVE RODS    Culture      FEW Consistent with normal respiratory flora. Performed at [facility], [place] [address], [place], [place] [address]    Report Status 04/01/2019 FINAL   Glucose, capillary     Status: Abnormal   Collection Time: 03/29/19  4:32 PM  Result Value Ref Range   Glucose-Capillary 155 (H) 70 - 99 mg/dL  Glucose, capillary     Status: Abnormal   Collection Time: 03/30/19 12:22 AM  Result Value Ref Range   Glucose-Capillary 175 (H) 70 - 99 mg/dL  Triglycerides     Status: Abnormal   Collection Time: 03/30/19   3:24 AM  Result Value Ref Range   Triglycerides 228 (H) <150 mg/dL    Comment: Performed at [facility], [place], [place] [address]  Procalcitonin     Status: None   Collection Time: 03/30/19  3:24 AM  Result Value Ref Range   Procalcitonin 0.14 ng/mL    Comment:        Interpretation: PCT (Procalcitonin) <= 0.5 ng/mL: Systemic infection (sepsis) is not likely. Local bacterial infection is possible. (NOTE)       Sepsis PCT Algorithm           Lower Respiratory Tract                                      Infection PCT Algorithm    ----------------------------     ----------------------------         PCT < 0.25 ng/mL                PCT < 0.10 ng/mL         Strongly encourage             Strongly discourage   discontinuation of antibiotics    initiation of antibiotics    ----------------------------     -----------------------------       PCT 0.25 - 0.50 ng/mL            PCT 0.10 - 0.25 ng/mL               OR       >80% decrease in PCT            Discourage initiation of                                            antibiotics      Encourage discontinuation           of antibiotics    ----------------------------     -----------------------------         PCT >= 0.50 ng/mL              PCT 0.26 - 0.50 ng/mL               AND        <80% decrease in PCT  Encourage initiation of                                             antibiotics       Encourage continuation           of antibiotics    ----------------------------     -----------------------------        PCT >= 0.50 ng/mL                  PCT > 0.50 ng/mL               AND         increase in PCT                  Strongly encourage                                      initiation of antibiotics    Strongly encourage escalation           of antibiotics                                     -----------------------------                                           PCT <= 0.25 ng/mL                                                  OR                                        > 80% decrease in PCT                                     Discontinue / Do not initiate                                             antibiotics Performed at [facility], [address] [place], [place] [address]   CBC     Status: Abnormal   Collection Time: 03/30/19  3:24 AM  Result Value Ref Range   WBC 5.7 4.0 - 10.5 K/uL   RBC 4.14 (L) 4.22 - 5.81 MIL/uL   Hemoglobin 12.1 (L) 13.0 - 17.0 g/dL   HCT 39.2 39.0 - 52.0 %   MCV 94.7 80.0 - 100.0 fL   MCH 29.2 26.0 - 34.0 pg   MCHC 30.9 30.0 - 36.0 g/dL   RDW 15.8 (H) 11.5 - 15.5 %   Platelets 157 150 - 400 K/uL   nRBC 0.0 0.0 - 0.2 %    Comment: Performed at [facility], [place], [place] [address]  Comprehensive metabolic panel     Status: Abnormal   Collection Time: 03/30/19  3:24 AM  Result Value Ref Range   Sodium 142 135 - 145 mmol/L   Potassium 4.0 3.5 - 5.1 mmol/L   Chloride 111 98 - 111 mmol/L   CO2 19 (L) 22 - 32 mmol/L   Glucose, Bld 167 (H) 70 - 99 mg/dL   BUN 24 (H) 8 - 23 mg/dL   Creatinine, Ser 0.85 0.61 - 1.24 mg/dL   Calcium 9.0 8.9 - 10.3 mg/dL   Total Protein 6.6 6.5 - 8.1 g/dL   Albumin 3.1 (L) 3.5 - 5.0 g/dL   AST 45 (H) 15 - 41 U/L   ALT 56 (H) 0 - 44 U/L   Alkaline Phosphatase 45 38 - 126 U/L   Total Bilirubin 0.7 0.3 - 1.2 mg/dL   GFR calc non Af Amer >60 >60 mL/min   GFR calc Af Amer >60 >60 mL/min   Anion gap 12 5 - 15    Comment: Performed at [facility], [address], [place], [place] [address]  Magnesium     Status: None   Collection Time: 03/30/19  3:24 AM  Result Value Ref Range   Magnesium 2.2 1.7 - 2.4 mg/dL    Comment: Performed at [facility], [address] [place], [place] [address]  Phosphorus     Status: None   Collection Time: 03/30/19  3:24 AM  Result Value Ref Range   Phosphorus 3.4 2.5 - 4.6 mg/dL    Comment: Performed at [facility], [address] [place], [place] [address]  Glucose, capillary     Status: Abnormal   Collection Time: 03/30/19  7:58 AM  Result Value Ref Range   Glucose-Capillary 154 (H) 70 - 99 mg/dL  Glucose, capillary     Status: Abnormal   Collection Time: 03/30/19  6:02 PM  Result Value Ref Range   Glucose-Capillary 120 (H) 70 - 99 mg/dL  Glucose, capillary     Status: Abnormal   Collection Time: 03/31/19 12:21 AM  Result Value Ref Range   Glucose-Capillary 152 (H) 70 - 99 mg/dL   Comment 1 Notify RN    Comment 2 Document in Chart   Procalcitonin     Status: None   Collection Time: 03/31/19  6:03 AM  Result Value Ref Range   Procalcitonin <0.10 ng/mL    Comment:        Interpretation: PCT (Procalcitonin) <= 0.5 ng/mL: Systemic infection (sepsis) is not likely. Local bacterial infection is possible. (NOTE)       Sepsis PCT Algorithm           Lower Respiratory Tract                                      Infection PCT Algorithm    ----------------------------     ----------------------------         PCT < 0.25 ng/mL                PCT < 0.10 ng/mL         Strongly encourage             Strongly discourage   discontinuation of antibiotics    initiation of antibiotics    ----------------------------     -----------------------------  PCT 0.25 - 0.50 ng/mL            PCT 0.10 - 0.25 ng/mL               OR       >80% decrease in PCT            Discourage initiation of                                            antibiotics      Encourage discontinuation           of antibiotics    ----------------------------     -----------------------------         PCT >= 0.50 ng/mL              PCT 0.26 - 0.50 ng/mL               AND        <80% decrease in PCT             Encourage initiation of                                             antibiotics       Encourage continuation           of antibiotics    ----------------------------     -----------------------------        PCT >= 0.50  ng/mL                  PCT > 0.50 ng/mL               AND         increase in PCT                  Strongly encourage                                      initiation of antibiotics    Strongly encourage escalation           of antibiotics                                     -----------------------------                                           PCT <= 0.25 ng/mL                                                 OR                                        > 80% decrease in PCT  Discontinue / Do not initiate                                             antibiotics Performed at [facility], [address] [place], [place] [id number]   Basic metabolic panel     Status: Abnormal   Collection Time: 03/31/19  6:03 AM  Result Value Ref Range   Sodium 143 135 - 145 mmol/L   Potassium 3.9 3.5 - 5.1 mmol/L   Chloride 115 (H) 98 - 111 mmol/L   CO2 23 22 - 32 mmol/L   Glucose, Bld 124 (H) 70 - 99 mg/dL   BUN 23 8 - 23 mg/dL   Creatinine, Ser 0.69 0.61 - 1.24 mg/dL   Calcium 9.0 8.9 - 10.3 mg/dL   GFR calc non Af Amer >60 >60 mL/min   GFR calc Af Amer >60 >60 mL/min   Anion gap 5 5 - 15    Comment: Performed at [facility], [address] [place], [name] [address]  Glucose, capillary     Status: Abnormal   Collection Time: 03/31/19  8:29 AM  Result Value Ref Range   Glucose-Capillary 125 (H) 70 - 99 mg/dL  Glucose, capillary     Status: Abnormal   Collection Time: 03/31/19 11:16 AM  Result Value Ref Range   Glucose-Capillary 178 (H) 70 - 99 mg/dL  Glucose, capillary     Status: Abnormal   Collection Time: 03/31/19  5:01 PM  Result Value Ref Range   Glucose-Capillary 109 (H) 70 - 99 mg/dL  Glucose, capillary     Status: Abnormal   Collection Time: 03/31/19 11:45 PM  Result Value Ref Range   Glucose-Capillary 112 (H) 70 - 99 mg/dL   Comment 1 Notify RN   Glucose, capillary     Status: None   Collection Time: 04/01/19  7:41 AM   Result Value Ref Range   Glucose-Capillary 88 70 - 99 mg/dL  Basic metabolic panel     Status: Abnormal   Collection Time: 04/01/19 10:43 AM  Result Value Ref Range   Sodium 138 135 - 145 mmol/L   Potassium 3.8 3.5 - 5.1 mmol/L   Chloride 106 98 - 111 mmol/L   CO2 23 22 - 32 mmol/L   Glucose, Bld 187 (H) 70 - 99 mg/dL   BUN 22 8 - 23 mg/dL   Creatinine, Ser 0.80 0.61 - 1.24 mg/dL   Calcium 9.2 8.9 - 10.3 mg/dL   GFR calc non Af Amer >60 >60 mL/min   GFR calc Af Amer >60 >60 mL/min   Anion gap 9 5 - 15    Comment: Performed at [facility], [address] [place], [place] [address]  Magnesium     Status: None   Collection Time: 04/01/19 10:43 AM  Result Value Ref Range   Magnesium 2.0 1.7 - 2.4 mg/dL    Comment: Performed at [facility], [address] [place], [place] [address]  Glucose, capillary     Status: Abnormal   Collection Time: 04/01/19 12:02 PM  Result Value Ref Range   Glucose-Capillary 100 (H) 70 - 99 mg/dL  Basic metabolic panel     Status: Abnormal   Collection Time: 04/02/19  5:35 AM  Result Value Ref Range   Sodium 142 135 - 145 mmol/L   Potassium 3.5 3.5 -  5.1 mmol/L   Chloride 107 98 - 111 mmol/L   CO2 24 22 - 32 mmol/L   Glucose, Bld 122 (H) 70 - 99 mg/dL   BUN 20 8 - 23 mg/dL   Creatinine, Ser 0.67 0.61 - 1.24 mg/dL   Calcium 9.1 8.9 - 10.3 mg/dL   GFR calc non Af Amer >60 >60 mL/min   GFR calc Af Amer >60 >60 mL/min   Anion gap 11 5 - 15    Comment: Performed at [facility], [address], [place], [place] [address]  Magnesium     Status: None   Collection Time: 04/02/19  5:35 AM  Result Value Ref Range   Magnesium 2.3 1.7 - 2.4 mg/dL    Comment: Performed at [facility], [address] [place], [place] [address]    -------------------------------------------------------------------------- A&P:  Problem List Items Addressed This Visit    Centrilobular emphysema ([place])   Relevant Orders    Ambulatory referral to Pulmonology    Other Visit Diagnoses    Acute exacerbation of chronic obstructive pulmonary disease (COPD) ([name])    -  Primary   Relevant Medications   levofloxacin (LEVAQUIN) 500 MG tablet   Pneumonia of left lower lobe due to infectious organism       Relevant Medications   levofloxacin (LEVAQUIN) 500 MG tablet   Confusion         Chronically ill 84 year old now still with residual symptoms following complex hospitalization Likely some persistent COPD affecting his energy and function, has appropriate pulse ox, has home health monitoring. No clear sign of acute infection or AECOPD - Will offer empiric levaquin antibiotic that has worked for him better in past, based on prior experiences for him, 500 daily x 7 days - Request to his pain management to Reduce dose of Lyrica from 150mg  TID down to 75mg  approx 2-3 times daily if possible, will send message to ask about this dose reduction, question if side effects for him - Also repeat referral to Pulm to initiate care - he should schedule to follow-up now with them, previous referral was closed did not schedule  Return criteria reviewed when to return to care  Recommend future palliative care referral for symptom management    Meds ordered this encounter  Medications  . levofloxacin (LEVAQUIN) 500 MG tablet    Sig: Take 1 tablet (500 mg total) by mouth daily. For 7 days    Dispense:  7 tablet    Refill:  0    Follow-up: - Return as needed  Patient verbalizes understanding with the above medical recommendations including the limitation of remote medical advice.  Specific follow-up and call-back criteria were given for patient to follow-up or seek medical care more urgently if needed.  - Time spent in direct consultation with patient on phone: 15 minutes   [name], [facility] 04/25/2019, 8:18 AM

## 2019-05-01 ENCOUNTER — Telehealth: Payer: Self-pay

## 2019-05-01 NOTE — Telephone Encounter (Signed)
Physical Therapy called for a verbal order to continue PT twice a week for 4 weeks. The verbal was given with the request for a fax to be sent over.

## 2019-05-20 ENCOUNTER — Other Ambulatory Visit: Payer: Self-pay | Admitting: Pain Medicine

## 2019-05-21 ENCOUNTER — Other Ambulatory Visit: Payer: Self-pay | Admitting: Family Medicine

## 2019-05-21 DIAGNOSIS — F332 Major depressive disorder, recurrent severe without psychotic features: Secondary | ICD-10-CM

## 2019-05-21 MED ORDER — BUPROPION HCL ER (SR) 150 MG PO TB12: 150.0000 mg | ORAL_TABLET | Freq: Two times a day (BID) | ORAL | 1 refills | Status: DC

## 2019-05-21 NOTE — Telephone Encounter (Signed)
Pt. Daughter called requesting refill on [place]

## 2019-05-23 ENCOUNTER — Other Ambulatory Visit: Payer: Self-pay | Admitting: Pain Medicine

## 2019-05-23 DIAGNOSIS — G894 Chronic pain syndrome: Secondary | ICD-10-CM

## 2019-05-26 ENCOUNTER — Telehealth: Payer: Self-pay | Admitting: Pain Medicine

## 2019-05-26 ENCOUNTER — Telehealth: Payer: Self-pay | Admitting: Family Medicine

## 2019-05-26 NOTE — Telephone Encounter (Signed)
I called the patient to schedule AWV-S with Tiffany after tomorrow's appointment with Dr. Raliegh Ip.  The patient was under the impression that the appt with Dr. Raliegh Ip is a virtual visit by phone, so I updated the appointment notes and scheduled the AWV with Tiffany as a virtual visit also.

## 2019-05-26 NOTE — Telephone Encounter (Signed)
It looks like [facility] is already listed as his primary pharmacy.

## 2019-05-26 NOTE — Telephone Encounter (Signed)
Excellent it will be great virtual way. Thank you.

## 2019-05-26 NOTE — Telephone Encounter (Signed)
Pt called and stated that he wants his pharmacy switched to [place]

## 2019-05-27 ENCOUNTER — Encounter: Payer: Self-pay | Admitting: Family Medicine

## 2019-05-27 ENCOUNTER — Other Ambulatory Visit: Payer: Self-pay

## 2019-05-27 ENCOUNTER — Ambulatory Visit (INDEPENDENT_AMBULATORY_CARE_PROVIDER_SITE_OTHER): Payer: Medicare Other | Admitting: Family Medicine

## 2019-05-27 ENCOUNTER — Ambulatory Visit (INDEPENDENT_AMBULATORY_CARE_PROVIDER_SITE_OTHER): Payer: Medicare Other

## 2019-05-27 DIAGNOSIS — F5104 Psychophysiologic insomnia: Secondary | ICD-10-CM | POA: Diagnosis not present

## 2019-05-27 DIAGNOSIS — Z0000 Encounter for general adult medical examination without abnormal findings: Secondary | ICD-10-CM | POA: Diagnosis not present

## 2019-05-27 DIAGNOSIS — J432 Centrilobular emphysema: Secondary | ICD-10-CM | POA: Diagnosis not present

## 2019-05-27 DIAGNOSIS — F332 Major depressive disorder, recurrent severe without psychotic features: Secondary | ICD-10-CM

## 2019-05-27 MED ORDER — TRAZODONE HCL 50 MG PO TABS: 50.0000 mg | ORAL_TABLET | Freq: Every day | ORAL | 1 refills | Status: DC

## 2019-05-27 MED ORDER — ALBUTEROL SULFATE HFA 108 (90 BASE) MCG/ACT IN AERS: 1.0000 | INHALATION_SPRAY | Freq: Four times a day (QID) | RESPIRATORY_TRACT | 2 refills | Status: DC | PRN

## 2019-05-27 MED ORDER — QUETIAPINE FUMARATE 100 MG PO TABS: 100.0000 mg | ORAL_TABLET | Freq: Every day | ORAL | 1 refills | Status: DC

## 2019-05-27 NOTE — Patient Instructions (Addendum)
AVS given by phone.  Please schedule a Follow-up Appointment to: Return in about 3 months (around 08/25/2019) for Depression / Insomnia / COPD.  If you have any other questions or concerns, please feel free to call the office or send a message through [place]. You may also schedule an earlier appointment if necessary.  Additionally, you may be receiving a survey about your experience at our office within a few days to 1 week by e-mail or mail. We value your feedback.  [name], DO [place]

## 2019-05-27 NOTE — Patient Instructions (Signed)
Aaron Gibbs , Thank you for taking time to come for your Medicare Wellness Visit. I appreciate your ongoing commitment to your health goals. Please review the following plan we discussed and let me know if I can assist you in the future.   Screening recommendations/referrals: Colonoscopy: no longer required Recommended yearly ophthalmology/optometry visit for glaucoma screening and checkup Recommended yearly dental visit for hygiene and checkup  Vaccinations: Influenza vaccine: up to date Pneumococcal vaccine: up to date Tdap vaccine:  Due now  Shingles vaccine: shingrix eligible     Advanced directives: please pick up a copy of this information next time you are in the office   Conditions/risks identified: fall risk   Next appointment: Follow up in one year for your annual wellness visit  Preventive Care 65 Years and Older, Male Preventive care refers to lifestyle choices and visits with your health care provider that can promote health and wellness. What does preventive care include?  A yearly physical exam. This is also called an annual well check.  Dental exams once or twice a year.  Routine eye exams. Ask your health care provider how often you should have your eyes checked.  Personal lifestyle choices, including:  Daily care of your teeth and gums.  Regular physical activity.  Eating a healthy diet.  Avoiding tobacco and drug use.  Limiting alcohol use.  Practicing safe sex.  Taking low doses of aspirin every day.  Taking vitamin and mineral supplements as recommended by your health care provider. What happens during an annual well check? The services and screenings done by your health care provider during your annual well check will depend on your age, overall health, lifestyle risk factors, and family history of disease. Counseling  Your health care provider may ask you questions about your:  Alcohol use.  Tobacco use.  Drug use.  Emotional  well-being.  Home and relationship well-being.  Sexual activity.  Eating habits.  History of falls.  Memory and ability to understand (cognition).  Work and work Statistician. Screening  You may have the following tests or measurements:  Height, weight, and BMI.  Blood pressure.  Lipid and cholesterol levels. These may be checked every 5 years, or more frequently if you are over 80 years old.  Skin check.  Lung cancer screening. You may have this screening every year starting at age 53 if you have a 30-pack-year history of smoking and currently smoke or have quit within the past 15 years.  Fecal occult blood test (FOBT) of the stool. You may have this test every year starting at age 80.  Flexible sigmoidoscopy or colonoscopy. You may have a sigmoidoscopy every 5 years or a colonoscopy every 10 years starting at age 59.  Prostate cancer screening. Recommendations will vary depending on your family history and other risks.  Hepatitis C blood test.  Hepatitis B blood test.  Sexually transmitted disease (STD) testing.  Diabetes screening. This is done by checking your blood sugar (glucose) after you have not eaten for a while (fasting). You may have this done every 1-3 years.  Abdominal aortic aneurysm (AAA) screening. You may need this if you are a current or former smoker.  Osteoporosis. You may be screened starting at age 62 if you are at high risk. Talk with your health care provider about your test results, treatment options, and if necessary, the need for more tests. Vaccines  Your health care provider may recommend certain vaccines, such as:  Influenza vaccine. This is recommended every  year.  Tetanus, diphtheria, and acellular pertussis (Tdap, Td) vaccine. You may need a Td booster every 10 years.  Zoster vaccine. You may need this after age 30.  Pneumococcal 13-valent conjugate (PCV13) vaccine. One dose is recommended after age 30.  Pneumococcal  polysaccharide (PPSV23) vaccine. One dose is recommended after age 51. Talk to your health care provider about which screenings and vaccines you need and how often you need them. This information is not intended to replace advice given to you by your health care provider. Make sure you discuss any questions you have with your health care provider. Document Released: 07/02/2015 Document Revised: 02/23/2016 Document Reviewed: 04/06/2015 Elsevier Interactive Patient Education  2017 [place] Prevention in the Home Falls can cause injuries. They can happen to people of all ages. There are many things you can do to make your home safe and to help prevent falls. What can I do on the outside of my home?  Regularly fix the edges of walkways and driveways and fix any cracks.  Remove anything that might make you trip as you walk through a door, such as a raised step or threshold.  Trim any bushes or trees on the path to your home.  Use bright outdoor lighting.  Clear any walking paths of anything that might make someone trip, such as rocks or tools.  Regularly check to see if handrails are loose or broken. Make sure that both sides of any steps have handrails.  Any raised decks and porches should have guardrails on the edges.  Have any leaves, snow, or ice cleared regularly.  Use sand or salt on walking paths during winter.  Clean up any spills in your garage right away. This includes oil or grease spills. What can I do in the bathroom?  Use night lights.  Install grab bars by the toilet and in the tub and shower. Do not use towel bars as grab bars.  Use non-skid mats or decals in the tub or shower.  If you need to sit down in the shower, use a plastic, non-slip stool.  Keep the floor dry. Clean up any water that spills on the floor as soon as it happens.  Remove soap buildup in the tub or shower regularly.  Attach bath mats securely with double-sided non-slip rug tape.   Do not have throw rugs and other things on the floor that can make you trip. What can I do in the bedroom?  Use night lights.  Make sure that you have a light by your bed that is easy to reach.  Do not use any sheets or blankets that are too big for your bed. They should not hang down onto the floor.  Have a firm chair that has side arms. You can use this for support while you get dressed.  Do not have throw rugs and other things on the floor that can make you trip. What can I do in the kitchen?  Clean up any spills right away.  Avoid walking on wet floors.  Keep items that you use a lot in easy-to-reach places.  If you need to reach something above you, use a strong step stool that has a grab bar.  Keep electrical cords out of the way.  Do not use floor polish or wax that makes floors slippery. If you must use wax, use non-skid floor wax.  Do not have throw rugs and other things on the floor that can make you trip. What can  I do with my stairs?  Do not leave any items on the stairs.  Make sure that there are handrails on both sides of the stairs and use them. Fix handrails that are broken or loose. Make sure that handrails are as long as the stairways.  Check any carpeting to make sure that it is firmly attached to the stairs. Fix any carpet that is loose or worn.  Avoid having throw rugs at the top or bottom of the stairs. If you do have throw rugs, attach them to the floor with carpet tape.  Make sure that you have a light switch at the top of the stairs and the bottom of the stairs. If you do not have them, ask someone to add them for you. What else can I do to help prevent falls?  Wear shoes that:  Do not have high heels.  Have rubber bottoms.  Are comfortable and fit you well.  Are closed at the toe. Do not wear sandals.  If you use a stepladder:  Make sure that it is fully opened. Do not climb a closed stepladder.  Make sure that both sides of the  stepladder are locked into place.  Ask someone to hold it for you, if possible.  Clearly mark and make sure that you can see:  Any grab bars or handrails.  First and last steps.  Where the edge of each step is.  Use tools that help you move around (mobility aids) if they are needed. These include:  Canes.  Walkers.  Scooters.  Crutches.  Turn on the lights when you go into a dark area. Replace any light bulbs as soon as they burn out.  Set up your furniture so you have a clear path. Avoid moving your furniture around.  If any of your floors are uneven, fix them.  If there are any pets around you, be aware of where they are.  Review your medicines with your doctor. Some medicines can make you feel dizzy. This can increase your chance of falling. Ask your doctor what other things that you can do to help prevent falls. This information is not intended to replace advice given to you by your health care provider. Make sure you discuss any questions you have with your health care provider. Document Released: 04/01/2009 Document Revised: 11/11/2015 Document Reviewed: 07/10/2014 Elsevier Interactive Patient Education  2017 [employer].

## 2019-05-27 NOTE — Progress Notes (Signed)
Subjective:   Aaron Gibbs is a 84 y.o. male who presents for Medicare Annual/Subsequent preventive examination.  This visit is being conducted via phone call  - after an attmept to do on video chat - due to the COVID-19 pandemic. This patient has given me verbal consent via phone to conduct this visit, patient states they are participating from their home address. Some vital signs may be absent or patient reported.   Patient identification: identified by name, DOB, and current address.    Review of Systems:   Cardiac Risk Factors include: advanced age (>22men, >80 women);dyslipidemia;hypertension     Objective:    Vitals: There were no vitals taken for this visit.  There is no height or weight on file to calculate BMI.  Advanced Directives 05/27/2019 04/01/2019 03/28/2019 03/13/2019 02/13/2019 02/13/2019 01/02/2019  Does Patient Have a Medical Advance Directive? No - No No No No No  Type of Advance Directive - - - - - - -  Would patient like information on creating a medical advance directive? - No - Patient declined - No - Patient declined No - Patient declined No - Patient declined No - Patient declined    Tobacco Social History   Tobacco Use  Smoking Status Former Smoker  . Years: 15.00  Smokeless Tobacco Current User  . Types: Chew  Tobacco Comment   stopped 15 years ago     Ready to quit: Not Answered Counseling given: Not Answered Comment: stopped 15 years ago   Clinical Intake:  Pre-visit preparation completed: Yes  Pain : 0-10 Pain Score: 6  Pain Type: Chronic pain Pain Location: Knee Pain Orientation: Left, Right Pain Descriptors / Indicators: Aching Pain Onset: More than a month ago Pain Frequency: Constant        How often do you need to have someone help you when you read instructions, pamphlets, or other written materials from your doctor or pharmacy?: 1 - Never  Interpreter Needed?: No  Information entered by :: Charbel Los,LPN  Past  Medical History:  Diagnosis Date  . Anxiety   . Cancer ([place])    skin  . COPD (chronic obstructive pulmonary disease) ([place])   . Glaucoma   . Osteoporosis    osteopenia   Past Surgical History:  Procedure Laterality Date  . CHOLECYSTECTOMY    . TRANSURETHRAL RESECTION OF PROSTATE N/A 2008   Family History  Problem Relation Age of Onset  . Depression Mother   . Heart disease Father    Social History   Socioeconomic History  . Marital status: Divorced    Spouse name: Not on file  . Number of children: Not on file  . Years of education: Not on file  . Highest education level: Not on file  Occupational History  . Occupation: retired  Scientific laboratory technician  . Financial resource strain: Not on file  . Food insecurity    Worry: Not on file    Inability: Not on file  . Transportation needs    Medical: Not on file    Non-medical: Not on file  Tobacco Use  . Smoking status: Former Smoker    Years: 15.00  . Smokeless tobacco: Current User    Types: Chew  . Tobacco comment: stopped 15 years ago  Substance and Sexual Activity  . Alcohol use: Yes    Comment: past  . Drug use: Never  . Sexual activity: Not Currently  Lifestyle  . Physical activity    Days per week: Not on file  Minutes per session: Not on file  . Stress: Not on file  Relationships  . Social Herbalist on phone: Not on file    Gets together: Not on file    Attends religious service: Not on file    Active member of club or organization: Not on file    Attends meetings of clubs or organizations: Not on file    Relationship status: Not on file  Other Topics Concern  . Not on file  Social History Narrative  . Not on file    Outpatient Encounter Medications as of 05/27/2019  Medication Sig  . albuterol (VENTOLIN HFA) 108 (90 Base) MCG/ACT inhaler Inhale 1-2 puffs into the lungs every 6 (six) hours as needed for wheezing or shortness of breath.  Marland Kitchen amLODipine (NORVASC) 10 MG tablet Take 10 mg by mouth  daily.  Marland Kitchen aspirin EC 81 MG tablet Take 81 mg by mouth daily.  . diclofenac sodium (VOLTAREN) 1 % GEL Apply 1 application topically as needed.  . DULoxetine (CYMBALTA) 60 MG capsule Take 60 mg by mouth daily.   . fenofibrate (TRICOR) 145 MG tablet Take 145 mg by mouth daily.  . ferrous sulfate 325 (65 FE) MG tablet Take 325 mg by mouth daily with breakfast.  . meloxicam (MOBIC) 15 MG tablet Take 1 tablet (15 mg total) by mouth daily. (Patient not taking: Reported on 05/27/2019)  . Multiple Vitamin (MULTIVITAMIN WITH MINERALS) TABS tablet Take 1 tablet by mouth daily.  . Oxycodone HCl 10 MG TABS Take 1 tablet (10 mg total) by mouth 3 (three) times daily as needed. Must last 30 days.  . Oxycodone HCl 10 MG TABS Take 1 tablet (10 mg total) by mouth 3 (three) times daily as needed. Must last 30 days. (Patient not taking: Reported on 05/27/2019)  . [START ON 06/26/2019] Oxycodone HCl 10 MG TABS Take 1 tablet (10 mg total) by mouth 3 (three) times daily as needed. Must last 30 days. (Patient not taking: Reported on 05/27/2019)  . pantoprazole (PROTONIX) 40 MG tablet Take 40 mg by mouth daily.   . prednisoLONE acetate (PRED FORTE) 1 % ophthalmic suspension Place 2 drops into the left eye daily.  . pregabalin (LYRICA) 150 MG capsule Take 1 capsule (150 mg total) by mouth 3 (three) times daily. Must last 30 days (Patient not taking: Reported on 05/27/2019)  . QUEtiapine (SEROQUEL) 100 MG tablet Take 1 tablet (100 mg total) by mouth at bedtime.  . timolol (TIMOPTIC) 0.5 % ophthalmic solution   . traZODone (DESYREL) 50 MG tablet Take 1 tablet (50 mg total) by mouth at bedtime.  Marland Kitchen umeclidinium-vilanterol (ANORO ELLIPTA) 62.5-25 MCG/INH AEPB Inhale 1 puff into the lungs daily.   No facility-administered encounter medications on file as of 05/27/2019.     Activities of Daily Living In your present state of health, do you have any difficulty performing the following activities: 05/27/2019 02/13/2019  Hearing? N N   Comment no hearing aids -  Vision? Y N  Comment no glasses, goes to [facility] every 6 months -  Difficulty concentrating or making decisions? N N  Walking or climbing stairs? Y Y  Comment okay if handrails -  Dressing or bathing? N Y  Doing errands, shopping? Y Y  Comment doesnt have much assistance. -  Preparing Food and eating ? N -  Using the Toilet? N -  In the past six months, have you accidently leaked urine? N -  Do you have  problems with loss of bowel control? N -  Managing your Medications? N -  Managing your Finances? N -  Housekeeping or managing your Housekeeping? Y -  Comment someone helps -  Some recent data might be hidden    Patient Care Team: Olin Hauser, DO as PCP - General (Family Medicine) [place] Ranger Devonne Doughty, DO (Family Medicine)   Assessment:   This is a routine wellness examination for Aaron Gibbs.  Exercise Activities and Dietary recommendations Current Exercise Habits: The patient does not participate in regular exercise at present, Exercise limited by: None identified  Goals   None     Fall Risk: Fall Risk  05/27/2019 03/13/2019 02/06/2019 01/21/2019 12/31/2018  Falls in the past year? 1 0 0 0 0  Comment - - - - -  Number falls in past yr: 1 - - - -  Injury with Fall? 0 - - - -  Comment - - - - -  Risk for fall due to : Impaired balance/gait;History of fall(s) - - - -  Follow up - - - - Falls evaluation completed    FALL RISK PREVENTION PERTAINING TO THE HOME:  Any stairs in or around the home? No  If so, are there any without handrails? No   Home free of loose throw rugs in walkways, pet beds, electrical cords, etc? Yes  Adequate lighting in your home to reduce risk of falls? Yes   ASSISTIVE DEVICES UTILIZED TO PREVENT FALLS:  Life alert? No  Use of a cane, walker or w/c? Yes  Grab bars in the bathroom? Yes   Shower chair or bench in shower? No  Elevated toilet seat or a handicapped toilet? No   TIMED UP AND  GO:  Unable to perform   Depression Screen PHQ 2/9 Scores 05/27/2019 04/25/2019 04/11/2019 04/09/2019  PHQ - 2 Score 1 2 - -  PHQ- 9 Score 10 8 - -  Exception Documentation - - Patient refusal Patient refusal    Cognitive Function        Immunization History  Administered Date(s) Administered  . Fluad Quad(high Dose 65+) 04/09/2019  . Pneumococcal Conjugate-13 04/09/2019    Qualifies for Shingles Vaccine? Yes  Zostavax completed 2015. Due for Shingrix. Education has been provided regarding the importance of this vaccine. Pt has been advised to call insurance company to determine out of pocket expense. Advised may also receive vaccine at local pharmacy or Health Dept. Verbalized acceptance and understanding.  Tdap: Discussed need for TD/TDAP vaccine, patient verbalized understanding that this is not covered as a preventative with there insurance and to call the office if develops any new skin injuries, ie: cuts, scrapes, bug bites, or open wounds.  Flu Vaccine: up to date  Pneumococcal Vaccine: up to date   Screening Tests Health Maintenance  Topic Date Due  . TETANUS/TDAP  07/13/2019 (Originally 03/19/1954)  . PNA vac Low Risk Adult (2 of 2 - PPSV23) 04/08/2020  . INFLUENZA VACCINE  Completed   Cancer Screenings:  Colorectal Screening: no longer required.  Lung Cancer Screening: (Low Dose CT Chest recommended if Age 53-80 years, 30 pack-year currently smoking OR have quit w/in 15years.) does not qualify.    Additional Screening:  Hepatitis C Screening: does not qualify  Vision Screening: Recommended annual ophthalmology exams for early detection of glaucoma and other disorders of the eye. Is the patient up to date with their annual eye exam?  Yes  Who is the provider or what is the name of the  office in which the pt attends annual eye exams? [facility]   Dental Screening: Recommended annual dental exams for proper oral hygiene  Community Resource Referral:   CRR required this visit?  Yes   Patient doesn't have transportation assistance, placed Connected care referral to let patient know about options for transportation help possibly through insurance or other programs if needed.       Plan:  I have personally reviewed and addressed the Medicare Annual Wellness questionnaire and have noted the following in the patient's chart:  A. Medical and social history B. Use of alcohol, tobacco or illicit drugs  C. Current medications and supplements D. Functional ability and status E.  Nutritional status F.  Physical activity G. Advance directives H. List of other physicians I.  Hospitalizations, surgeries, and ER visits in previous 12 months J.  [place] such as hearing and vision if needed, cognitive and depression L. Referrals and appointments   In addition, I have reviewed and discussed with patient certain preventive protocols, quality metrics, and best practice recommendations. A written personalized care plan for preventive services as well as general preventive health recommendations were provided to patient.   Signed,   Bevelyn Ngo, LPN  [id number] Nurse Health Advisor   Nurse Notes: none

## 2019-05-27 NOTE — Progress Notes (Signed)
Virtual Visit via Telephone The purpose of this virtual visit is to provide medical care while limiting exposure to the novel coronavirus (COVID19) for both patient and office staff.  Consent was obtained for phone visit:  Yes.   Answered questions that patient had about telehealth interaction:  Yes.   I discussed the limitations, risks, security and privacy concerns of performing an evaluation and management service by telephone. I also discussed with the patient that there may be a patient responsible charge related to this service. The patient expressed understanding and agreed to proceed.  Patient Location: Home Provider Location: [facility])  ---------------------------------------------------------------------- Chief Complaint  Patient presents with  . Hallucinations    mostly at bedtime, pt state the symptoms have improved over the last 3-4 night. He contributed it to the Wellbutrin. He discontinued the medication about 4-5 days ago   Patient has also completed AMW with Tyler Aas LPN today   S: Reviewed CMA documentation. I have called patient and gathered additional HPI as follows:  Follow-up Recurrent Major Depression, Severe, chronic Insomnia See prior notes for background information. Followed by [facility] Psychiatry Last change was made by ED which added Wellbutrin for him in recent history. He had continued his other psych meds, was having issues with hallucinations waking up at night and difficulty sleeping. - Currently in past several days he discontinued Wellbutrin and has done better, reduced hallucinations, and overall seems improved. Still has some issues with insomnia and chronic mood. - He needs refill on Trazodone 50mg  nightly, Quetiapine 100mg  daily - He also takes Duloxetine 60mg  daily still - he has upcoming apt with Psychiatry to follow up on the medicines - He was concerned about med side effects, was asking about Lyrica, and pain  management did not think lyrica was contributing.  Centrilobular Emphysema (COPD) He is currently breathing well. He needs a refill on Albuterol. No acute flare up or new concerns.  Denies any high risk travel to areas of current concern for COVID19. Denies any known or suspected exposure to person with or possibly with COVID19.  Denies any fevers, chills, sweats, body ache, cough, shortness of breath, sinus pain or pressure, headache, abdominal pain, diarrhea  Past Medical History:  Diagnosis Date  . Anxiety   . Cancer ([place])    skin  . COPD (chronic obstructive pulmonary disease) ([place])   . Glaucoma   . Osteoporosis    osteopenia   Social History   Tobacco Use  . Smoking status: Former Smoker    Years: 15.00  . Smokeless tobacco: Current User    Types: Chew  . Tobacco comment: stopped 15 years ago  Substance Use Topics  . Alcohol use: Yes    Comment: past  . Drug use: Never    Current Outpatient Medications:  .  albuterol (VENTOLIN HFA) 108 (90 Base) MCG/ACT inhaler, Inhale 1-2 puffs into the lungs every 6 (six) hours as needed for wheezing or shortness of breath., Disp: 8 g, Rfl: 2 .  amLODipine (NORVASC) 10 MG tablet, Take 10 mg by mouth daily., Disp: , Rfl:  .  diclofenac sodium (VOLTAREN) 1 % GEL, Apply 1 application topically as needed., Disp: , Rfl:  .  DULoxetine (CYMBALTA) 60 MG capsule, Take 60 mg by mouth daily. , Disp: , Rfl:  .  fenofibrate (TRICOR) 145 MG tablet, Take 145 mg by mouth daily., Disp: , Rfl:  .  ferrous sulfate 325 (65 FE) MG tablet, Take 325 mg by mouth daily with  breakfast., Disp: , Rfl:  .  Multiple Vitamin (MULTIVITAMIN WITH MINERALS) TABS tablet, Take 1 tablet by mouth daily., Disp: , Rfl:  .  Oxycodone HCl 10 MG TABS, Take 1 tablet (10 mg total) by mouth 3 (three) times daily as needed. Must last 30 days., Disp: 90 tablet, Rfl: 0 .  pantoprazole (PROTONIX) 40 MG tablet, Take 40 mg by mouth daily. , Disp: , Rfl:  .  prednisoLONE acetate (PRED  FORTE) 1 % ophthalmic suspension, Place 2 drops into the left eye daily., Disp: 5 mL, Rfl: 0 .  QUEtiapine (SEROQUEL) 100 MG tablet, Take 1 tablet (100 mg total) by mouth at bedtime., Disp: 90 tablet, Rfl: 1 .  timolol (TIMOPTIC) 0.5 % ophthalmic solution, , Disp: , Rfl:  .  umeclidinium-vilanterol (ANORO ELLIPTA) 62.5-25 MCG/INH AEPB, Inhale 1 puff into the lungs daily., Disp: , Rfl:  .  aspirin EC 81 MG tablet, Take 81 mg by mouth daily., Disp: , Rfl:  .  meloxicam (MOBIC) 15 MG tablet, Take 1 tablet (15 mg total) by mouth daily. (Patient not taking: Reported on 05/27/2019), Disp: 30 tablet, Rfl: 5 .  Oxycodone HCl 10 MG TABS, Take 1 tablet (10 mg total) by mouth 3 (three) times daily as needed. Must last 30 days. (Patient not taking: Reported on 05/27/2019), Disp: 90 tablet, Rfl: 0 .  [START ON 06/26/2019] Oxycodone HCl 10 MG TABS, Take 1 tablet (10 mg total) by mouth 3 (three) times daily as needed. Must last 30 days. (Patient not taking: Reported on 05/27/2019), Disp: 90 tablet, Rfl: 0 .  pregabalin (LYRICA) 150 MG capsule, Take 1 capsule (150 mg total) by mouth 3 (three) times daily. Must last 30 days (Patient not taking: Reported on 05/27/2019), Disp: 90 capsule, Rfl: 5 .  traZODone (DESYREL) 50 MG tablet, Take 1 tablet (50 mg total) by mouth at bedtime., Disp: 90 tablet, Rfl: 1  Depression screen [facility] 2/9 05/27/2019 04/25/2019 03/13/2019  Decreased Interest 0 1 0  Down, Depressed, Hopeless 1 1 0  PHQ - 2 Score 1 2 0  Altered sleeping 3 1 -  Tired, decreased energy 3 3 -  Change in appetite 2 2 -  Feeling bad or failure about yourself  0 0 -  Trouble concentrating 1 0 -  Moving slowly or fidgety/restless 0 0 -  Suicidal thoughts 0 0 -  PHQ-9 Score 10 8 -  Difficult doing work/chores Somewhat difficult Somewhat difficult -    GAD 7 : Generalized Anxiety Score 05/27/2019 04/11/2019 07/12/2018 05/01/2018  Nervous, Anxious, on Edge 2 (No Data) 1 0  Control/stop worrying 2 - 1 0  Worry too much -  different things 2 - 1 0  Trouble relaxing 2 - 1 0  Restless 1 - 0 0  Easily annoyed or irritable 1 - 0 0  Afraid - awful might happen 0 - 1 0  Total GAD 7 Score 10 - 5 0  Anxiety Difficulty Somewhat difficult - Somewhat difficult Not difficult at all    -------------------------------------------------------------------------- O: No physical exam performed due to remote telephone encounter.  Lab results reviewed.  Recent Results (from the past 2160 hour(s))  Comprehensive metabolic panel     Status: Abnormal   Collection Time: 03/28/19  9:32 PM  Result Value Ref Range   Sodium 140 135 - 145 mmol/L   Potassium 4.5 3.5 - 5.1 mmol/L   Chloride 109 98 - 111 mmol/L   CO2 19 (L) 22 - 32 mmol/L   Glucose,  Bld 145 (H) 70 - 99 mg/dL   BUN 33 (H) 8 - 23 mg/dL   Creatinine, Ser 1.21 0.61 - 1.24 mg/dL   Calcium 9.3 8.9 - 10.3 mg/dL   Total Protein 7.4 6.5 - 8.1 g/dL   Albumin 3.4 (L) 3.5 - 5.0 g/dL   AST 52 (H) 15 - 41 U/L   ALT 45 (H) 0 - 44 U/L   Alkaline Phosphatase 51 38 - 126 U/L   Total Bilirubin 0.8 0.3 - 1.2 mg/dL   GFR calc non Af Amer 55 (L) >60 mL/min   GFR calc Af Amer >60 >60 mL/min   Anion gap 12 5 - 15    Comment: Performed at [facility], [address] [place], [place] [address]  CBC with Differential     Status: Abnormal   Collection Time: 03/28/19  9:32 PM  Result Value Ref Range   WBC 7.7 4.0 - 10.5 K/uL   RBC 4.51 4.22 - 5.81 MIL/uL   Hemoglobin 13.3 13.0 - 17.0 g/dL   HCT 41.7 39.0 - 52.0 %   MCV 92.5 80.0 - 100.0 fL   MCH 29.5 26.0 - 34.0 pg   MCHC 31.9 30.0 - 36.0 g/dL   RDW 15.9 (H) 11.5 - 15.5 %   Platelets 179 150 - 400 K/uL   nRBC 0.0 0.0 - 0.2 %   Neutrophils Relative % 63 %   Neutro Abs 4.8 1.7 - 7.7 K/uL   Lymphocytes Relative 18 %   Lymphs Abs 1.4 0.7 - 4.0 K/uL   Monocytes Relative 13 %   Monocytes Absolute 1.0 0.1 - 1.0 K/uL   Eosinophils Relative 5 %   Eosinophils Absolute 0.4 0.0 - 0.5 K/uL   Basophils Relative 0 %    Basophils Absolute 0.0 0.0 - 0.1 K/uL   Immature Granulocytes 1 %   Abs Immature Granulocytes 0.04 0.00 - 0.07 K/uL    Comment: Performed at [facility], [place], [place] [address]  Troponin I (High Sensitivity)     Status: None   Collection Time: 03/28/19  9:32 PM  Result Value Ref Range   Troponin I (High Sensitivity) 11 <18 ng/L    Comment: (NOTE) Elevated high sensitivity troponin I (hsTnI) values and significant  changes across serial measurements may suggest ACS but many other  chronic and acute conditions are known to elevate hsTnI results.  Refer to the "Links" section for chest pain algorithms and additional  guidance. Performed at [facility], [address] [place], [place] [address]   Brain natriuretic peptide     Status: None   Collection Time: 03/28/19  9:32 PM  Result Value Ref Range   B Natriuretic Peptide 52.0 0.0 - 100.0 pg/mL    Comment: Performed at [facility], [address] [place], [place] [address]  Lactic acid, plasma     Status: None   Collection Time: 03/28/19  9:32 PM  Result Value Ref Range   Lactic Acid, Venous 1.9 0.5 - 1.9 mmol/L    Comment: Performed at [facility], [address] [place],  [address]  Urinalysis, Complete w Microscopic     Status: Abnormal   Collection Time: 03/28/19 10:26 PM  Result Value Ref Range   Color, Urine YELLOW (A) YELLOW   APPearance HAZY (A) CLEAR   Specific Gravity, Urine 1.025 1.005 - 1.030   pH 5.0 5.0 - 8.0   Glucose, UA NEGATIVE NEGATIVE mg/dL   Hgb  urine dipstick NEGATIVE NEGATIVE   Bilirubin Urine NEGATIVE NEGATIVE   Ketones, ur NEGATIVE NEGATIVE mg/dL   Protein, ur 30 (A) NEGATIVE mg/dL   Nitrite NEGATIVE NEGATIVE   Leukocytes,Ua MODERATE (A) NEGATIVE   RBC / HPF 0-5 0 - 5 RBC/hpf   WBC, UA >50 (H) 0 - 5 WBC/hpf   Bacteria, UA NONE SEEN NONE SEEN   Squamous Epithelial / LPF 0-5 0 - 5   Mucus PRESENT     Comment: Performed at  [facility], [address] [place], [place] [address]  Legionella Pneumophila Serogp 1 Ur Ag     Status: None   Collection Time: 03/28/19 10:26 PM  Result Value Ref Range   L. pneumophila Serogp 1 Ur Ag Negative Negative    Comment: (NOTE) Presumptive negative for L. pneumophila serogroup 1 antigen in urine, suggesting no recent or current infection. Legionnaires' disease cannot be ruled out since other serogroups and species may also cause disease. Performed At: [facility] [place], [place] [id number] Rush Farmer MD [id number]    Source of Sample URINE, RANDOM     Comment: Performed at [facility], [address] [place], [place] [address]  Strep pneumoniae urinary antigen  (not at [facility])     Status: None   Collection Time: 03/28/19 10:26 PM  Result Value Ref Range   Strep Pneumo Urinary Antigen NEGATIVE NEGATIVE    Comment: Performed at [facility] [address] [place], [place] [address]  SARS Coronavirus 2 by RT PCR (hospital order, performed in [facility] hospital lab) Nasopharyngeal Nasopharyngeal Swab     Status: None   Collection Time: 03/28/19 10:52 PM   Specimen: Nasopharyngeal Swab  Result Value Ref Range   SARS Coronavirus 2 NEGATIVE NEGATIVE    Comment: (NOTE) If result is NEGATIVE SARS-CoV-2 target nucleic acids are NOT DETECTED. The SARS-CoV-2 RNA is generally detectable in upper and lower  respiratory specimens during the acute phase of infection. The lowest  concentration of SARS-CoV-2 viral copies this assay can detect is 250  copies / mL. A negative result does not preclude SARS-CoV-2 infection  and should not be used as the sole basis for treatment or other  patient management decisions.  A negative result may occur with  improper specimen collection / handling, submission of specimen other  than nasopharyngeal swab, presence of viral mutation(s) within the  areas targeted by this assay, and  inadequate number of viral copies  (<250 copies / mL). A negative result must be combined with clinical  observations, patient history, and epidemiological information. If result is POSITIVE SARS-CoV-2 target nucleic acids are DETECTED. The SARS-CoV-2 RNA is generally detectable in upper and lower  respiratory specimens dur ing the acute phase of infection.  Positive  results are indicative of active infection with SARS-CoV-2.  Clinical  correlation with patient history and other diagnostic information is  necessary to determine patient infection status.  Positive results do  not rule out bacterial infection or co-infection with other viruses. If result is PRESUMPTIVE POSTIVE SARS-CoV-2 nucleic acids MAY BE PRESENT.   A presumptive positive result was obtained on the submitted specimen  and confirmed on repeat testing.  While 2019 novel coronavirus  (SARS-CoV-2) nucleic acids may be present in the submitted sample  additional confirmatory testing may be necessary for epidemiological  and / or clinical management purposes  to differentiate between  SARS-CoV-2 and other Sarbecovirus currently known to infect humans.  If clinically indicated  additional testing with an alternate test  methodology [phone]) is advised. The SARS-CoV-2 RNA is generally  detectable in upper and lower respiratory sp ecimens during the acute  phase of infection. The expected result is Negative. Fact Sheet for Patients:  StrictlyIdeas.no Fact Sheet for Healthcare Providers: BankingDealers.co.za This test is not yet approved or cleared by the [place] FDA and has been authorized for detection and/or diagnosis of SARS-CoV-2 by FDA under an Emergency Use Authorization (EUA).  This EUA will remain in effect (meaning this test can be used) for the duration of the COVID-19 declaration under Section 564(b)(1) of the Act, 21 U.S.C. section 360bbb-3(b)(1), unless the  authorization is terminated or revoked sooner. Performed at [facility], [address] [place], [place] [address]   Blood gas, arterial ([facility] & [facility] ONLY)     Status: Abnormal   Collection Time: 03/29/19 12:24 AM  Result Value Ref Range   FIO2 0.50    VT 500 mL   Peep/cpap 5.0 cm H20   pH, Arterial 7.28 (L) 7.350 - 7.450   pCO2 arterial 47 32.0 - 48.0 mmHg   pO2, Arterial 113 (H) 83.0 - 108.0 mmHg   Bicarbonate 22.1 20.0 - 28.0 mmol/L   Acid-base deficit 4.8 (H) 0.0 - 2.0 mmol/L   O2 Saturation 97.8 %   Patient temperature 37.0    Collection site RIGHT RADIAL    Sample type ARTERIAL DRAW    Allens test (pass/fail) PASS PASS   Mechanical Rate 20     Comment: Performed at [facility], [address] [place], [place] [address]  Glucose, capillary     Status: Abnormal   Collection Time: 03/29/19  1:56 AM  Result Value Ref Range   Glucose-Capillary 178 (H) 70 - 99 mg/dL  Blood gas, arterial     Status: Abnormal   Collection Time: 03/29/19  2:04 AM  Result Value Ref Range   FIO2 0.50    Delivery systems VENTILATOR    Mode PRESSURE REGULATED VOLUME CONTROL    VT 500 mL   Peep/cpap 5.0 cm H20   pH, Arterial 7.38 7.350 - 7.450   pCO2 arterial 38 32.0 - 48.0 mmHg   pO2, Arterial 135 (H) 83.0 - 108.0 mmHg   Bicarbonate 22.5 20.0 - 28.0 mmol/L   Acid-base deficit 2.3 (H) 0.0 - 2.0 mmol/L   O2 Saturation 99.0 %   Patient temperature 37.0    Collection site RIGHT RADIAL    Sample type ARTERIAL DRAW    Allens test (pass/fail) PASS PASS   Mechanical Rate 20     Comment: Performed at [facility] [address] [place], [place] [address]  Procalcitonin     Status: None   Collection Time: 03/29/19  2:21 AM  Result Value Ref Range   Procalcitonin 0.26 ng/mL    Comment:        Interpretation: PCT (Procalcitonin) <= 0.5 ng/mL: Systemic infection (sepsis) is not likely. Local bacterial infection is possible. (NOTE)       Sepsis PCT Algorithm            Lower Respiratory Tract                                      Infection PCT Algorithm    ----------------------------     ----------------------------         PCT < 0.25 ng/mL  PCT < 0.10 ng/mL         Strongly encourage             Strongly discourage   discontinuation of antibiotics    initiation of antibiotics    ----------------------------     -----------------------------       PCT 0.25 - 0.50 ng/mL            PCT 0.10 - 0.25 ng/mL               OR       >80% decrease in PCT            Discourage initiation of                                            antibiotics      Encourage discontinuation           of antibiotics    ----------------------------     -----------------------------         PCT >= 0.50 ng/mL              PCT 0.26 - 0.50 ng/mL               AND        <80% decrease in PCT             Encourage initiation of                                             antibiotics       Encourage continuation           of antibiotics    ----------------------------     -----------------------------        PCT >= 0.50 ng/mL                  PCT > 0.50 ng/mL               AND         increase in PCT                  Strongly encourage                                      initiation of antibiotics    Strongly encourage escalation           of antibiotics                                     -----------------------------                                           PCT <= 0.25 ng/mL                                                 OR                                        >  80% decrease in PCT                                     Discontinue / Do not initiate                                             antibiotics Performed at [facility], [address] [place], [place] [address]   CBC     Status: Abnormal   Collection Time: 03/29/19  2:21 AM  Result Value Ref Range   WBC 7.1 4.0 - 10.5 K/uL   RBC 4.54 4.22 - 5.81 MIL/uL   Hemoglobin 13.4 13.0 - 17.0  g/dL   HCT 42.5 39.0 - 52.0 %   MCV 93.6 80.0 - 100.0 fL   MCH 29.5 26.0 - 34.0 pg   MCHC 31.5 30.0 - 36.0 g/dL   RDW 15.9 (H) 11.5 - 15.5 %   Platelets 169 150 - 400 K/uL   nRBC 0.0 0.0 - 0.2 %    Comment: Performed at [facility], [address] [place], [place] [id number]  Basic metabolic panel     Status: Abnormal   Collection Time: 03/29/19  2:21 AM  Result Value Ref Range   Sodium 142 135 - 145 mmol/L   Potassium 4.5 3.5 - 5.1 mmol/L   Chloride 113 (H) 98 - 111 mmol/L   CO2 21 (L) 22 - 32 mmol/L   Glucose, Bld 179 (H) 70 - 99 mg/dL   BUN 35 (H) 8 - 23 mg/dL   Creatinine, Ser 1.14 0.61 - 1.24 mg/dL   Calcium 9.3 8.9 - 10.3 mg/dL   GFR calc non Af Amer 60 (L) >60 mL/min   GFR calc Af Amer >60 >60 mL/min   Anion gap 8 5 - 15    Comment: Performed at [facility], [address] [place], [place] [address]  HIV Antibody (routine testing w rflx)     Status: None   Collection Time: 03/29/19  2:21 AM  Result Value Ref Range   HIV Screen 4th Generation wRfx NON REACTIVE NON REACTIVE    Comment: Performed at [facility] [address], [place], [place] [address]  Triglycerides     Status: Abnormal   Collection Time: 03/29/19  2:21 AM  Result Value Ref Range   Triglycerides 187 (H) <150 mg/dL    Comment: Performed at [facility], [address] [place], [place] [address]  Magnesium     Status: None   Collection Time: 03/29/19  2:21 AM  Result Value Ref Range   Magnesium 2.1 1.7 - 2.4 mg/dL    Comment: Performed at [facility], [address] [place], [place] [address]  Phosphorus     Status: None   Collection Time: 03/29/19  2:21 AM  Result Value Ref Range   Phosphorus 4.0 2.5 - 4.6 mg/dL    Comment: Performed at [facility], [address] [place],  [address]  MRSA PCR Screening     Status: None   Collection Time: 03/29/19  2:50 AM   Specimen: Nasopharyngeal  Result Value Ref Range   MRSA by PCR  NEGATIVE NEGATIVE    Comment:        The GeneXpert MRSA Assay (FDA approved for  NASAL specimens only), is one component of a comprehensive MRSA colonization surveillance program. It is not intended to diagnose MRSA infection nor to guide or monitor treatment for MRSA infections. Performed at [facility], [address] [place], [place] [address]   Urine Drug Screen, Qualitative [facility] [place] [facility] W only)     Status: Abnormal   Collection Time: 03/29/19  3:00 AM  Result Value Ref Range   Tricyclic, Ur Screen POSITIVE (A) NONE DETECTED   Amphetamines, Ur Screen NONE DETECTED NONE DETECTED   MDMA (Ecstasy)Ur Screen NONE DETECTED NONE DETECTED   Cocaine Metabolite,Ur [place] NONE DETECTED NONE DETECTED   Opiate, Ur Screen POSITIVE (A) NONE DETECTED   Phencyclidine (PCP) Ur S NONE DETECTED NONE DETECTED   Cannabinoid 50 Ng, Ur [place] NONE DETECTED NONE DETECTED   Barbiturates, Ur Screen NONE DETECTED NONE DETECTED   Benzodiazepine, Ur Scrn POSITIVE (A) NONE DETECTED   Methadone Scn, Ur NONE DETECTED NONE DETECTED    Comment: (NOTE) Tricyclics + metabolites, urine    Cutoff 1000 ng/mL Amphetamines + metabolites, urine  Cutoff 1000 ng/mL MDMA (Ecstasy), urine              Cutoff 500 ng/mL Cocaine Metabolite, urine          Cutoff 300 ng/mL Opiate + metabolites, urine        Cutoff 300 ng/mL Phencyclidine (PCP), urine         Cutoff 25 ng/mL Cannabinoid, urine                 Cutoff 50 ng/mL Barbiturates + metabolites, urine  Cutoff 200 ng/mL Benzodiazepine, urine              Cutoff 200 ng/mL Methadone, urine                   Cutoff 300 ng/mL The urine drug screen provides only a preliminary, unconfirmed analytical test result and should not be used for non-medical purposes. Clinical consideration and professional judgment should be applied to any positive drug screen result due to possible interfering substances. A more specific alternate chemical method must be used in order to obtain a  confirmed analytical result. Gas chromatography / mass spectrometry (GC/MS) is the preferred confirmat ory method. Performed at [facility], [address] [place], [place] [address]   Urine Culture     Status: None   Collection Time: 03/29/19  3:00 AM   Specimen: Urine, Random  Result Value Ref Range   Specimen Description      URINE, RANDOM Performed at [facility], [address] [place], [place] [address]    Special Requests      NONE Performed at [facility], [address] [place], [place] [address]    Culture      NO GROWTH Performed at [facility], [place] [address] [place], [place] [address]    Report Status 03/30/2019 FINAL   Culture, blood (Routine X 2) w Reflex to ID Panel     Status: None   Collection Time: 03/29/19  6:33 AM   Specimen: BLOOD  Result Value Ref Range   Specimen Description BLOOD R HAND    Special Requests      BOTTLES DRAWN AEROBIC ONLY Blood Culture adequate volume   Culture      NO GROWTH 5 DAYS Performed at [facility], [address] [place], [place] [address]    Report Status 04/03/2019 FINAL   Culture, blood (Routine X  2) w Reflex to ID Panel     Status: None   Collection Time: 03/29/19  6:46 AM   Specimen: BLOOD  Result Value Ref Range   Specimen Description BLOOD RFOA    Special Requests      BOTTLES DRAWN AEROBIC AND ANAEROBIC Blood Culture adequate volume   Culture      NO GROWTH 5 DAYS Performed at [facility], [address] [place], [place] [address]    Report Status 04/03/2019 FINAL   Glucose, capillary     Status: Abnormal   Collection Time: 03/29/19  8:01 AM  Result Value Ref Range   Glucose-Capillary 119 (H) 70 - 99 mg/dL  Culture, respiratory (non-expectorated)     Status: None   Collection Time: 03/29/19  2:45 PM   Specimen: Tracheal Aspirate; Respiratory  Result Value Ref Range   Specimen Description      TRACHEAL ASPIRATE Performed at [facility], [address] [place], [place] [address]    Special Requests      NONE Performed at [facility], [place], [place] [address]    Gram Stain      MODERATE WBC PRESENT, PREDOMINANTLY PMN MODERATE GRAM POSITIVE COCCI IN CLUSTERS RARE GRAM NEGATIVE RODS    Culture      FEW Consistent with normal respiratory flora. Performed at [facility], [place] [address], [place], [place] [address]    Report Status 04/01/2019 FINAL   Glucose, capillary     Status: Abnormal   Collection Time: 03/29/19  4:32 PM  Result Value Ref Range   Glucose-Capillary 155 (H) 70 - 99 mg/dL  Glucose, capillary     Status: Abnormal   Collection Time: 03/30/19 12:22 AM  Result Value Ref Range   Glucose-Capillary 175 (H) 70 - 99 mg/dL  Triglycerides     Status: Abnormal   Collection Time: 03/30/19  3:24 AM  Result Value Ref Range   Triglycerides 228 (H) <150 mg/dL    Comment: Performed at [name], [place], [place] [address]  Procalcitonin     Status: None   Collection Time: 03/30/19  3:24 AM  Result Value Ref Range   Procalcitonin 0.14 ng/mL    Comment:        Interpretation: PCT (Procalcitonin) <= 0.5 ng/mL: Systemic infection (sepsis) is not likely. Local bacterial infection is possible. (NOTE)       Sepsis PCT Algorithm           Lower Respiratory Tract                                      Infection PCT Algorithm    ----------------------------     ----------------------------         PCT < 0.25 ng/mL                PCT < 0.10 ng/mL         Strongly encourage             Strongly discourage   discontinuation of antibiotics    initiation of antibiotics    ----------------------------     -----------------------------       PCT 0.25 - 0.50 ng/mL            PCT 0.10 - 0.25 ng/mL  OR       >80% decrease in PCT            Discourage initiation of                                            antibiotics      Encourage  discontinuation           of antibiotics    ----------------------------     -----------------------------         PCT >= 0.50 ng/mL              PCT 0.26 - 0.50 ng/mL               AND        <80% decrease in PCT             Encourage initiation of                                             antibiotics       Encourage continuation           of antibiotics    ----------------------------     -----------------------------        PCT >= 0.50 ng/mL                  PCT > 0.50 ng/mL               AND         increase in PCT                  Strongly encourage                                      initiation of antibiotics    Strongly encourage escalation           of antibiotics                                     -----------------------------                                           PCT <= 0.25 ng/mL                                                 OR                                        > 80% decrease in PCT                                     Discontinue / Do not initiate  antibiotics Performed at [facility], [address] [place], [place] [address]   CBC     Status: Abnormal   Collection Time: 03/30/19  3:24 AM  Result Value Ref Range   WBC 5.7 4.0 - 10.5 K/uL   RBC 4.14 (L) 4.22 - 5.81 MIL/uL   Hemoglobin 12.1 (L) 13.0 - 17.0 g/dL   HCT 39.2 39.0 - 52.0 %   MCV 94.7 80.0 - 100.0 fL   MCH 29.2 26.0 - 34.0 pg   MCHC 30.9 30.0 - 36.0 g/dL   RDW 15.8 (H) 11.5 - 15.5 %   Platelets 157 150 - 400 K/uL   nRBC 0.0 0.0 - 0.2 %    Comment: Performed at [facility], [address] [place], [place] [address]  Comprehensive metabolic panel     Status: Abnormal   Collection Time: 03/30/19  3:24 AM  Result Value Ref Range   Sodium 142 135 - 145 mmol/L   Potassium 4.0 3.5 - 5.1 mmol/L   Chloride 111 98 - 111 mmol/L   CO2 19 (L) 22 - 32 mmol/L   Glucose, Bld 167 (H) 70 - 99 mg/dL   BUN 24 (H) 8 - 23 mg/dL    Creatinine, Ser 0.85 0.61 - 1.24 mg/dL   Calcium 9.0 8.9 - 10.3 mg/dL   Total Protein 6.6 6.5 - 8.1 g/dL   Albumin 3.1 (L) 3.5 - 5.0 g/dL   AST 45 (H) 15 - 41 U/L   ALT 56 (H) 0 - 44 U/L   Alkaline Phosphatase 45 38 - 126 U/L   Total Bilirubin 0.7 0.3 - 1.2 mg/dL   GFR calc non Af Amer >60 >60 mL/min   GFR calc Af Amer >60 >60 mL/min   Anion gap 12 5 - 15    Comment: Performed at [facility], [address], [place], [place] [address]  Magnesium     Status: None   Collection Time: 03/30/19  3:24 AM  Result Value Ref Range   Magnesium 2.2 1.7 - 2.4 mg/dL    Comment: Performed at [facility], [address] [place], [place] [address]  Phosphorus     Status: None   Collection Time: 03/30/19  3:24 AM  Result Value Ref Range   Phosphorus 3.4 2.5 - 4.6 mg/dL    Comment: Performed at [facility], [address] [place], [place] [address]  Glucose, capillary     Status: Abnormal   Collection Time: 03/30/19  7:58 AM  Result Value Ref Range   Glucose-Capillary 154 (H) 70 - 99 mg/dL  Glucose, capillary     Status: Abnormal   Collection Time: 03/30/19  6:02 PM  Result Value Ref Range   Glucose-Capillary 120 (H) 70 - 99 mg/dL  Glucose, capillary     Status: Abnormal   Collection Time: 03/31/19 12:21 AM  Result Value Ref Range   Glucose-Capillary 152 (H) 70 - 99 mg/dL   Comment 1 Notify RN    Comment 2 Document in Chart   Procalcitonin     Status: None   Collection Time: 03/31/19  6:03 AM  Result Value Ref Range   Procalcitonin <0.10 ng/mL    Comment:        Interpretation: PCT (Procalcitonin) <= 0.5 ng/mL: Systemic infection (sepsis) is not likely. Local bacterial infection is possible. (NOTE)       Sepsis PCT Algorithm           Lower Respiratory Tract  Infection PCT Algorithm    ----------------------------     ----------------------------         PCT < 0.25 ng/mL                PCT < 0.10 ng/mL          Strongly encourage             Strongly discourage   discontinuation of antibiotics    initiation of antibiotics    ----------------------------     -----------------------------       PCT 0.25 - 0.50 ng/mL            PCT 0.10 - 0.25 ng/mL               OR       >80% decrease in PCT            Discourage initiation of                                            antibiotics      Encourage discontinuation           of antibiotics    ----------------------------     -----------------------------         PCT >= 0.50 ng/mL              PCT 0.26 - 0.50 ng/mL               AND        <80% decrease in PCT             Encourage initiation of                                             antibiotics       Encourage continuation           of antibiotics    ----------------------------     -----------------------------        PCT >= 0.50 ng/mL                  PCT > 0.50 ng/mL               AND         increase in PCT                  Strongly encourage                                      initiation of antibiotics    Strongly encourage escalation           of antibiotics                                     -----------------------------                                           PCT <= 0.25 ng/mL  OR                                        > 80% decrease in PCT                                     Discontinue / Do not initiate                                             antibiotics Performed at [facility], [address] [place], [place] [id number]   Basic metabolic panel     Status: Abnormal   Collection Time: 03/31/19  6:03 AM  Result Value Ref Range   Sodium 143 135 - 145 mmol/L   Potassium 3.9 3.5 - 5.1 mmol/L   Chloride 115 (H) 98 - 111 mmol/L   CO2 23 22 - 32 mmol/L   Glucose, Bld 124 (H) 70 - 99 mg/dL   BUN 23 8 - 23 mg/dL   Creatinine, Ser 0.69 0.61 - 1.24 mg/dL   Calcium 9.0 8.9 - 10.3 mg/dL   GFR calc non Af Amer >60 >60  mL/min   GFR calc Af Amer >60 >60 mL/min   Anion gap 5 5 - 15    Comment: Performed at [facility], [address] [place], [place] [address]  Glucose, capillary     Status: Abnormal   Collection Time: 03/31/19  8:29 AM  Result Value Ref Range   Glucose-Capillary 125 (H) 70 - 99 mg/dL  Glucose, capillary     Status: Abnormal   Collection Time: 03/31/19 11:16 AM  Result Value Ref Range   Glucose-Capillary 178 (H) 70 - 99 mg/dL  Glucose, capillary     Status: Abnormal   Collection Time: 03/31/19  5:01 PM  Result Value Ref Range   Glucose-Capillary 109 (H) 70 - 99 mg/dL  Glucose, capillary     Status: Abnormal   Collection Time: 03/31/19 11:45 PM  Result Value Ref Range   Glucose-Capillary 112 (H) 70 - 99 mg/dL   Comment 1 Notify RN   Glucose, capillary     Status: None   Collection Time: 04/01/19  7:41 AM  Result Value Ref Range   Glucose-Capillary 88 70 - 99 mg/dL  Basic metabolic panel     Status: Abnormal   Collection Time: 04/01/19 10:43 AM  Result Value Ref Range   Sodium 138 135 - 145 mmol/L   Potassium 3.8 3.5 - 5.1 mmol/L   Chloride 106 98 - 111 mmol/L   CO2 23 22 - 32 mmol/L   Glucose, Bld 187 (H) 70 - 99 mg/dL   BUN 22 8 - 23 mg/dL   Creatinine, Ser 0.80 0.61 - 1.24 mg/dL   Calcium 9.2 8.9 - 10.3 mg/dL   GFR calc non Af Amer >60 >60 mL/min   GFR calc Af Amer >60 >60 mL/min   Anion gap 9 5 - 15    Comment: Performed at [facility], [address] [place], [place] [address]  Magnesium     Status: None   Collection Time: 04/01/19 10:43 AM  Result Value Ref Range   Magnesium 2.0 1.7 - 2.4 mg/dL  Comment: Performed at [facility], [address] [place], [place] [address]  Glucose, capillary     Status: Abnormal   Collection Time: 04/01/19 12:02 PM  Result Value Ref Range   Glucose-Capillary 100 (H) 70 - 99 mg/dL  Basic metabolic panel     Status: Abnormal   Collection Time: 04/02/19  5:35 AM  Result Value Ref Range   Sodium  142 135 - 145 mmol/L   Potassium 3.5 3.5 - 5.1 mmol/L   Chloride 107 98 - 111 mmol/L   CO2 24 22 - 32 mmol/L   Glucose, Bld 122 (H) 70 - 99 mg/dL   BUN 20 8 - 23 mg/dL   Creatinine, Ser 0.67 0.61 - 1.24 mg/dL   Calcium 9.1 8.9 - 10.3 mg/dL   GFR calc non Af Amer >60 >60 mL/min   GFR calc Af Amer >60 >60 mL/min   Anion gap 11 5 - 15    Comment: Performed at [facility], [address], [place], [place] [address]  Magnesium     Status: None   Collection Time: 04/02/19  5:35 AM  Result Value Ref Range   Magnesium 2.3 1.7 - 2.4 mg/dL    Comment: Performed at [facility], [address] [place], [place] [address]    -------------------------------------------------------------------------- A&P:  Problem List Items Addressed This Visit    Major depressive disorder, recurrent episode, severe ([place]) - Primary   Relevant Medications   QUEtiapine (SEROQUEL) 100 MG tablet   traZODone (DESYREL) 50 MG tablet   Centrilobular emphysema (HCC)   Relevant Medications   albuterol (VENTOLIN HFA) 108 (90 Base) MCG/ACT inhaler    Other Visit Diagnoses    Psychophysiological insomnia       Relevant Medications   QUEtiapine (SEROQUEL) 100 MG tablet   traZODone (DESYREL) 50 MG tablet     #Major Depression recurrent severe / Insomnia Chronic problem. Followed by [facility] On multiple medications currently with Quetiapine, Trazodone, Duloxetine Now off Wellbutrin, self discontinued, doing better seems had side effect, impairing his sleep on this med. Additionally describing hypnagogic/hypnopompic hallucinations but seems normal with partial sleep/wake state he is in, can be related to meds but otherwise not having any daytime or active hallucinations it seems.  Agree with off Wellbutrin now For now will continue other meds, discussed risk of side effect / interaction / serotonin syndrome, but advised him next to discuss these meds further with upcoming Psychiatry apt. -  Refilled today  #Emphysema COPD, without flare Controlled Refill Albuterol   Meds ordered this encounter  Medications  . albuterol (VENTOLIN HFA) 108 (90 Base) MCG/ACT inhaler    Sig: Inhale 1-2 puffs into the lungs every 6 (six) hours as needed for wheezing or shortness of breath.    Dispense:  8 g    Refill:  2  . QUEtiapine (SEROQUEL) 100 MG tablet    Sig: Take 1 tablet (100 mg total) by mouth at bedtime.    Dispense:  90 tablet    Refill:  1  . traZODone (DESYREL) 50 MG tablet    Sig: Take 1 tablet (50 mg total) by mouth at bedtime.    Dispense:  90 tablet    Refill:  1    Follow-up: - Return in 6 weeks to 3 months as needed  Patient verbalizes understanding with the above medical recommendations including the limitation of remote medical advice.  Specific follow-up and call-back criteria were given for patient to follow-up or seek medical  care more urgently if needed.   - Time spent in direct consultation with patient on phone: 11 minutes   [name], [facility] 05/27/2019, 10:12 AM

## 2019-06-10 ENCOUNTER — Ambulatory Visit
Admission: RE | Admit: 2019-06-10 | Discharge: 2019-06-10 | Disposition: A | Discharge status: Home / Self Care | Payer: Medicare Other | Source: Ambulatory Visit | Attending: Pain Medicine | Admitting: Pain Medicine

## 2019-06-10 ENCOUNTER — Other Ambulatory Visit: Payer: Self-pay

## 2019-06-10 ENCOUNTER — Ambulatory Visit (HOSPITAL_BASED_OUTPATIENT_CLINIC_OR_DEPARTMENT_OTHER): Payer: Medicare Other | Admitting: Pain Medicine

## 2019-06-10 ENCOUNTER — Encounter: Payer: Self-pay | Admitting: Pain Medicine

## 2019-06-10 VITALS — BP 136/68 | HR 72 | Temp 97.7°F | Resp 16 | Ht 70.0 in | Wt 240.0 lb

## 2019-06-10 DIAGNOSIS — M170 Bilateral primary osteoarthritis of knee: Secondary | ICD-10-CM

## 2019-06-10 DIAGNOSIS — M25561 Pain in right knee: Secondary | ICD-10-CM | POA: Insufficient documentation

## 2019-06-10 DIAGNOSIS — M7650 Patellar tendinitis, unspecified knee: Secondary | ICD-10-CM | POA: Insufficient documentation

## 2019-06-10 DIAGNOSIS — G8918 Other acute postprocedural pain: Secondary | ICD-10-CM | POA: Insufficient documentation

## 2019-06-10 DIAGNOSIS — M67869 Other specified disorders of synovium and tendon, unspecified knee: Secondary | ICD-10-CM | POA: Insufficient documentation

## 2019-06-10 DIAGNOSIS — G8929 Other chronic pain: Secondary | ICD-10-CM | POA: Diagnosis not present

## 2019-06-10 DIAGNOSIS — G894 Chronic pain syndrome: Secondary | ICD-10-CM

## 2019-06-10 DIAGNOSIS — M25562 Pain in left knee: Secondary | ICD-10-CM

## 2019-06-10 DIAGNOSIS — S83242S Other tear of medial meniscus, current injury, left knee, sequela: Secondary | ICD-10-CM | POA: Insufficient documentation

## 2019-06-10 MED ORDER — FENTANYL CITRATE (PF) 100 MCG/2ML IJ SOLN: 25.0000 ug | INTRAMUSCULAR | Status: DC | PRN

## 2019-06-10 MED ORDER — LACTATED RINGERS IV SOLN: 1000.0000 mL | Freq: Once | INTRAVENOUS | Status: DC

## 2019-06-10 MED ORDER — OXYCODONE HCL 10 MG PO TABS: 10.0000 mg | ORAL_TABLET | Freq: Three times a day (TID) | ORAL | 0 refills | Status: DC | PRN

## 2019-06-10 MED ORDER — METHYLPREDNISOLONE ACETATE 80 MG/ML IJ SUSP
80.0000 mg | Freq: Once | INTRAMUSCULAR | Status: AC
  Administered 2019-06-10: 13:00:00 80 mg
  Filled 2019-06-10: qty 1

## 2019-06-10 MED ORDER — ROPIVACAINE HCL 2 MG/ML IJ SOLN
18.0000 mL | Freq: Once | INTRAMUSCULAR | Status: AC
  Administered 2019-06-10: 13:00:00 18 mL via PERINEURAL
  Filled 2019-06-10: qty 20

## 2019-06-10 MED ORDER — TRAMADOL HCL 50 MG PO TABS: 50.0000 mg | ORAL_TABLET | Freq: Three times a day (TID) | ORAL | 0 refills | Status: DC | PRN

## 2019-06-10 MED ORDER — LIDOCAINE HCL 2 % IJ SOLN
20.0000 mL | Freq: Once | INTRAMUSCULAR | Status: AC
  Administered 2019-06-10: 13:00:00 400 mg
  Filled 2019-06-10: qty 40

## 2019-06-10 MED ORDER — MIDAZOLAM HCL 5 MG/5ML IJ SOLN: 1.0000 mg | INTRAMUSCULAR | Status: DC | PRN

## 2019-06-10 NOTE — Patient Instructions (Signed)

## 2019-06-10 NOTE — Progress Notes (Addendum)
PROVIDER NOTE: Information contained herein reflects review and annotations entered in association with encounter. Patient information is provided elsewhere in the medical record. Interpretation of information and data should be left to medically trained personnel. Document created using STT technology, any transcriptional errors that may result from process are unintentional. Patient's Name: Aaron Gibbs  MRN: [id number]  Referring Provider: [name] *  DOB: 12/11/1934  PCP: Olin Hauser, DO  DOS: 06/10/2019  Note by: Gaspar Cola, MD  Service setting: Ambulatory outpatient  Specialty: Interventional Pain Management  Patient type: Established  Location: [facility] (AMB) Pain Management Facility  Visit type: Interventional Procedure   Primary Reason for Visit: Interventional Pain Management Treatment. CC: Knee Pain (bilateral)  Procedure:          Anesthesia, Analgesia, Anxiolysis:  Type: Therapeutic Superior-lateral, Superior-medial, and Inferior-medial, Genicular Nerve Radiofrequency Ablation.  #1  Region: Lateral, Anterior, and Medial aspects of the knee joint, above and below the knee joint proper. Level: Superior and inferior to the knee joint. Laterality: Bilateral  Type: Local Anesthesia Indication(s): Analgesia         Route: Infiltration ([place]/IM) IV Access: Declined Sedation: None since NPO instructions were not followed  Local Anesthetic: Lidocaine 1-2%  Position: Supine   Indications: 1. Chronic knee pain (Primary Area of Pain) (Bilateral) (L>R)   2. Osteoarthritis of knee (Bilateral)   3. Osteoarthritis of patellofemoral joint (Right)   4. Tricompartment osteoarthritis of knee (Left)    [name] has been dealing with the above chronic pain for longer than three months and has either failed to respond, was unable to tolerate, or simply did not get enough benefit from other more conservative therapies including, but not limited to: 1.  Over-the-counter medications 2. Anti-inflammatory medications 3. Muscle relaxants 4. Membrane stabilizers 5. Opioids 6. Physical therapy and/or chiropractic manipulation 7. Modalities (Heat, ice, etc.) 8. Invasive techniques such as nerve blocks. [name] has attained more than 50% relief of the pain from a series of diagnostic injections conducted in separate occasions.  Pain Score: Pre-procedure: 7 /10 Post-procedure: 0-No pain/10  Pre-op Assessment:  [name] is a 84 y.o. (year old), male patient, seen today for interventional treatment. He  has a past surgical history that includes Cholecystectomy and Transurethral resection of prostate (N/A, 2008). [name] has a current medication list which includes the following prescription(s): albuterol, amlodipine, aspirin ec, diclofenac sodium, diclofenac sodium, duloxetine, fenofibrate, ferrous sulfate, meloxicam, multivitamin with minerals, [START ON 06/26/2019] oxycodone hcl, pantoprazole, prednisolone acetate, pregabalin, quetiapine, timolol, trazodone, umeclidinium-vilanterol, [START ON 07/26/2019] oxycodone hcl, [START ON 08/25/2019] oxycodone hcl, tramadol, and [START ON 06/17/2019] tramadol. His primarily concern today is the Knee Pain (bilateral)  Initial Vital Signs:  Pulse/HCG Rate: 72ECG Heart Rate: 73 Temp: 97.7 F (36.5 C) Resp: 20 BP: 106/68 SpO2: 96 %  BMI: Estimated body mass index is 34.44 kg/m as calculated from the following:   Height as of this encounter: 5\' 10"  (1.778 m).   Weight as of this encounter: 240 lb (108.9 kg).  Risk Assessment: Allergies: Reviewed. He is allergic to azithromycin.  Allergy Precautions: None required Coagulopathies: Reviewed. None identified.  Blood-thinner therapy: None at this time Active Infection(s): Reviewed. None identified. [name] is afebrile  Site Confirmation: [name] was asked to confirm the procedure and laterality before marking the site Procedure checklist:  Completed Consent: Before the procedure and under the influence of no sedative(s), amnesic(s), or anxiolytics, the patient was informed of the treatment options, risks and possible complications.  To fulfill our ethical and legal obligations, as recommended by the American Medical Association's Code of Ethics, I have informed the patient of my clinical impression; the nature and purpose of the treatment or procedure; the risks, benefits, and possible complications of the intervention; the alternatives, including doing nothing; the risk(s) and benefit(s) of the alternative treatment(s) or procedure(s); and the risk(s) and benefit(s) of doing nothing. The patient was provided information about the general risks and possible complications associated with the procedure. These may include, but are not limited to: failure to achieve desired goals, infection, bleeding, organ or nerve damage, allergic reactions, paralysis, and death. In addition, the patient was informed of those risks and complications associated to the procedure, such as failure to decrease pain; infection; bleeding; organ or nerve damage with subsequent damage to sensory, motor, and/or autonomic systems, resulting in permanent pain, numbness, and/or weakness of one or several areas of the body; allergic reactions; (i.e.: anaphylactic reaction); and/or death. Furthermore, the patient was informed of those risks and complications associated with the medications. These include, but are not limited to: allergic reactions (i.e.: anaphylactic or anaphylactoid reaction(s)); adrenal axis suppression; blood sugar elevation that in diabetics may result in ketoacidosis or comma; water retention that in patients with history of congestive heart failure may result in shortness of breath, pulmonary edema, and decompensation with resultant heart failure; weight gain; swelling or edema; medication-induced neural toxicity; particulate matter embolism and blood vessel  occlusion with resultant organ, and/or nervous system infarction; and/or aseptic necrosis of one or more joints. Finally, the patient was informed that Medicine is not an exact science; therefore, there is also the possibility of unforeseen or unpredictable risks and/or possible complications that may result in a catastrophic outcome. The patient indicated having understood very clearly. We have given the patient no guarantees and we have made no promises. Enough time was given to the patient to ask questions, all of which were answered to the patient's satisfaction. Mr. Almeida has indicated that he wanted to continue with the procedure. Attestation: I, the ordering provider, attest that I have discussed with the patient the benefits, risks, side-effects, alternatives, likelihood of achieving goals, and potential problems during recovery for the procedure that I have provided informed consent. Date  Time: 06/10/2019 12:51 PM  Pre-Procedure Preparation:  Monitoring: As per clinic protocol. Respiration, ETCO2, SpO2, BP, heart rate and rhythm monitor placed and checked for adequate function Safety Precautions: Patient was assessed for positional comfort and pressure points before starting the procedure. Time-out: I initiated and conducted the "Time-out" before starting the procedure, as per protocol. The patient was asked to participate by confirming the accuracy of the "Time Out" information. Verification of the correct person, site, and procedure were performed and confirmed by me, the nursing staff, and the patient. "Time-out" conducted as per Joint Commission's Universal Protocol (UP.01.01.01). Time: 1314  Description of Procedure:          Target Area: For Genicular Nerve block(s), the targets are: the superior-lateral genicular nerve, located in the lateral distal portion of the femoral shaft as it curves to form the lateral epicondyle, in the region of the distal femoral metaphysis; the  superior-medial genicular nerve, located in the medial distal portion of the femoral shaft as it curves to form the medial epicondyle; and the inferior-medial genicular nerve, located in the medial, proximal portion of the tibial shaft, as it curves to form the medial epicondyle, in the region of the proximal tibial metaphysis. Approach: Anterior, ipsilateral approach. Area  Prepped: Entire knee area, from mid-thigh to mid-shin, lateral, anterior, and medial aspects. Prepping solution: DuraPrep (Iodine Povacrylex [0.7% available iodine] and Isopropyl Alcohol, 74% w/w) Safety Precautions: Aspiration looking for blood return was conducted prior to all injections. At no point did we inject any substances, as a needle was being advanced. No attempts were made at seeking any paresthesias. Safe injection practices and needle disposal techniques used. Medications properly checked for expiration dates. SDV (single dose vial) medications used. Description of the Procedure: Protocol guidelines were followed. The patient was placed in position over the procedure table. The target area was identified and the area prepped in the usual manner. The skin and muscle were infiltrated with local anesthetic. Appropriate amount of time allowed to pass for local anesthetics to take effect. Radiofrequency needles were introduced to the target area using fluoroscopic guidance. Using the NeuroTherm NT1100 Radiofrequency Generator, sensory stimulation using 50 Hz was used to locate & identify the nerve, making sure that the needle was positioned such that there was no sensory stimulation below 0.3 V or above 0.7 V. Stimulation using 2 Hz was used to evaluate the motor component. Care was taken not to lesion any nerves that demonstrated motor stimulation of the lower extremities at an output of less than 2.5 times that of the sensory threshold, or a maximum of 2.0 V. Once satisfactory placement of the needles was achieved, the numbing  solution was slowly injected after negative aspiration. After waiting for at least 2 minutes, the ablation was performed at 80 degrees C for 60 seconds, using regular Radiofrequency settings. Once the procedure was completed, the needles were then removed and the area cleansed, making sure to leave some of the prepping solution back to take advantage of its long term bactericidal properties. Intra-operative Compliance: Compliant Vitals:   06/10/19 1348 06/10/19 1353 06/10/19 1359 06/10/19 1403  BP: 114/62 (!) 116/57 125/60 136/68  Pulse:      Resp: 16 16 13 16   Temp:      SpO2: 93% 95% 97% 98%  Weight:      Height:        Start Time: 1314 hrs. End Time: 1400 hrs. Materials & Medications:  Needle(s) Type: Teflon-coated, curved tip, Radiofrequency needle(s) Gauge: 20G Length: 15cm Medication(s): Please see orders for medications and dosing details.  Imaging Guidance (Non-Spinal):          Type of Imaging Technique: Fluoroscopy Guidance (Non-Spinal) Indication(s): Assistance in needle guidance and placement for procedures requiring needle placement in or near specific anatomical locations not easily accessible without such assistance. Exposure Time: Please see nurses notes. Contrast: Before injecting any contrast, we confirmed that the patient did not have an allergy to iodine, shellfish, or radiological contrast. Once satisfactory needle placement was completed at the desired level, radiological contrast was injected. Contrast injected under live fluoroscopy. No contrast complications. See chart for type and volume of contrast used. Fluoroscopic Guidance: I was personally present during the use of fluoroscopy. "Tunnel Vision Technique" used to obtain the best possible view of the target area. Parallax error corrected before commencing the procedure. "Direction-depth-direction" technique used to introduce the needle under continuous pulsed fluoroscopy. Once target was reached, antero-posterior,  oblique, and lateral fluoroscopic projection used confirm needle placement in all planes. Images permanently stored in EMR. Interpretation: I personally interpreted the imaging intraoperatively. Adequate needle placement confirmed in multiple planes. Appropriate spread of contrast into desired area was observed. No evidence of afferent or efferent intravascular uptake. Permanent images saved into the  patient's record.  Antibiotic Prophylaxis:   Anti-infectives (From admission, onward)   None     Indication(s): None identified  Post-operative Assessment:  Post-procedure Vital Signs:  Pulse/HCG Rate: 7270 Temp: 97.7 F (36.5 C) Resp: 16 BP: 136/68 SpO2: 98 %  EBL: None  Complications: No immediate post-treatment complications observed by team, or reported by patient.  Note: The patient tolerated the entire procedure well. A repeat set of vitals were taken after the procedure and the patient was kept under observation following institutional policy, for this type of procedure. Post-procedural neurological assessment was performed, showing return to baseline, prior to discharge. The patient was provided with post-procedure discharge instructions, including a section on how to identify potential problems. Should any problems arise concerning this procedure, the patient was given instructions to immediately contact us, at any time, without hesitation. In any case, we plan to contact the patient by telephone for a follow-up status report regarding this interventional procedure.  Comments:  No additional relevant information.  Plan of Care  Orders:  Orders Placed This Encounter  Procedures  . RFA - Genicular (Today)    Scheduling Instructions:     Side(s): Bilateral Knee     Level(s): Superior-Lateral, Superior-Medial, and Inferior-Medial Genicular Nerve(s)     Sedation: With Sedation     Timeframe: Today    Order Specific Question:   Where will this procedure be performed?    Answer:    [facility]  . Fluoro (C-Arm) (<60 min) (No Report)    Intraoperative interpretation by procedural physician at [facility].    Standing Status:   Standing    Number of Occurrences:   1    Order Specific Question:   Reason for exam:    Answer:   Assistance in needle guidance and placement for procedures requiring needle placement in or near specific anatomical locations not easily accessible without such assistance.  . Consent:    Provider Attestation: I, Parker Strip Dossie Arbour, MD, (Pain Management Specialist), the physician/practitioner, attest that I have discussed with the patient the benefits, risks, side effects, alternatives, likelihood of achieving goals and potential problems during recovery for the procedure that I have provided informed consent.    Scheduling Instructions:     Procedure: Bilateral genicular nerves radiofrequency ablation     Indications: Chronic bilateral knee pain     Note: Always confirm laterality of pain with Mr. Mezey, before procedure.     Transcribe to consent form and obtain patient signature.  . Radiofrequency Tray    Equipment required: Sterile "Radiofrequency Tray"; Large hemostat (1); Small hemostat (1); Towels (6-8); 4x4 sterile sponge pack (1) Radiofrequency Needle(s): Size: Long Quantity: 6    Standing Status:   Standing    Number of Occurrences:   1    Order Specific Question:   Specify    Answer:   Radiofrequency Tray   Chronic Opioid Analgesic:  Oxycodone IR 10 mg, 1 tab PO q 8 hrs (30 mg/day of oxycodone) (has enough to last until 02/24/2019) MME/day: 45 mg/day.   Medications ordered for procedure: Meds ordered this encounter  Medications  . lidocaine (XYLOCAINE) 2 % (with pres) injection 400 mg  . DISCONTD: lactated ringers infusion 1,000 mL  . DISCONTD: midazolam (VERSED) 5 MG/5ML injection 1-2 mg    Make sure Flumazenil is available in the pyxis when using this medication. If oversedation occurs, administer 0.2 mg  IV over 15 sec. If after 45 sec no response, administer 0.2 mg again over  1 min; may repeat at 1 min intervals; not to exceed 4 doses (1 mg)  . DISCONTD: fentaNYL (SUBLIMAZE) injection 25-50 mcg    Make sure Narcan is available in the pyxis when using this medication. In the event of respiratory depression (RR< 8/min): Titrate NARCAN (naloxone) in increments of 0.1 to 0.2 mg IV at 2-3 minute intervals, until desired degree of reversal.  . ropivacaine (PF) 2 mg/mL (0.2%) (NAROPIN) injection 18 mL  . methylPREDNISolone acetate (DEPO-MEDROL) injection 80 mg  . Oxycodone HCl 10 MG TABS    Sig: Take 1 tablet (10 mg total) by mouth 3 (three) times daily as needed. Must last 30 days.    Dispense:  90 tablet    Refill:  0    Chronic Pain: STOP Act (Not applicable) Fill 1 day early if closed on refill date. Do not fill until: 07/26/2019. To last until: 08/25/2019. Avoid benzodiazepines within 8 hours of opioids  . Oxycodone HCl 10 MG TABS    Sig: Take 1 tablet (10 mg total) by mouth 3 (three) times daily as needed. Must last 30 days.    Dispense:  90 tablet    Refill:  0    Chronic Pain: STOP Act (Not applicable) Fill 1 day early if closed on refill date. Do not fill until: 08/25/2019. To last until: 09/24/2019. Avoid benzodiazepines within 8 hours of opioids  . traMADol (ULTRAM) 50 MG tablet    Sig: Take 1 tablet (50 mg total) by mouth every 8 (eight) hours as needed for up to 7 days for severe pain. Max: 3/day    Dispense:  21 tablet    Refill:  0    For acute postop pain.  . traMADol (ULTRAM) 50 MG tablet    Sig: Take 1 tablet (50 mg total) by mouth every 8 (eight) hours as needed for up to 7 days for severe pain. Max: 3/day    Dispense:  21 tablet    Refill:  0    For acute postop pain.   Medications administered: We administered lidocaine, ropivacaine (PF) 2 mg/mL (0.2%), and methylPREDNISolone acetate.  See the medical record for exact dosing, route, and time of administration.  Follow-up plan:    Return in about 6 weeks (around 07/22/2019) for (VV), (PP).       Interventional management options: Considering: Diagnostic bilateral genicular nerve block #1(today) Possible bilateral genicular nerve RFA   PRN Procedures: Palliative bilateral IA Hyalgan knee injections S3/N1  Palliative bilateral IA knee injections (w/ steroids) #1     Recent Visits Date Type Provider Dept  04/07/19 Office Visit Milinda Pointer, MD [facility]  03/13/19 Procedure visit Milinda Pointer, MD [facility]  Showing recent visits within past 90 days and meeting all other requirements   Today's Visits Date Type Provider Dept  06/10/19 Procedure visit Milinda Pointer, MD [facility]  Showing today's visits and meeting all other requirements   Future Appointments Date Type Provider Dept  07/21/19 Appointment Milinda Pointer, MD [facility]  Showing future appointments within next 90 days and meeting all other requirements   Disposition: Discharge home  Discharge Date & Time: 06/10/2019; 1409 hrs.   Primary Care Physician: Olin Hauser, DO Location: [facility] Note by: Gaspar Cola, MD Date: 06/10/2019; Time: 2:23 PM  Disclaimer:  Medicine is not an Chief Strategy Officer. The only guarantee in medicine is that nothing is guaranteed. It is important to note that the decision to proceed  with this intervention was based on the information collected from the patient. The Data and conclusions were drawn from the patient's questionnaire, the interview, and the physical examination. Because the information was provided in large part by the patient, it cannot be guaranteed that it has not been purposely or unconsciously manipulated. Every effort has been made to obtain as much relevant data as possible for this evaluation. It is important to note that the conclusions that lead to this procedure are derived in large  part from the available data. Always take into account that the treatment will also be dependent on availability of resources and existing treatment guidelines, considered by other Pain Management Practitioners as being common knowledge and practice, at the time of the intervention. For Medico-Legal purposes, it is also important to point out that variation in procedural techniques and pharmacological choices are the acceptable norm. The indications, contraindications, technique, and results of the above procedure should only be interpreted and judged by a Board-Certified Interventional Pain Specialist with extensive familiarity and expertise in the same exact procedure and technique.

## 2019-06-11 ENCOUNTER — Telehealth: Payer: Self-pay

## 2019-06-11 NOTE — Telephone Encounter (Signed)
Post procedure phone call.  LM 

## 2019-06-17 ENCOUNTER — Telehealth: Payer: Self-pay | Admitting: Family Medicine

## 2019-06-17 DIAGNOSIS — G894 Chronic pain syndrome: Secondary | ICD-10-CM

## 2019-06-17 DIAGNOSIS — F332 Major depressive disorder, recurrent severe without psychotic features: Secondary | ICD-10-CM

## 2019-06-17 MED ORDER — DULOXETINE HCL 60 MG PO CPEP: 60.0000 mg | ORAL_CAPSULE | Freq: Every day | ORAL | 1 refills | Status: DC

## 2019-06-17 NOTE — Telephone Encounter (Signed)
Patient notified as per Dr.K he will drop the paperwork once he will receive it.

## 2019-06-17 NOTE — Telephone Encounter (Signed)
Pt called said that he is unable to get in touch with  someone at [facility] to get his Cymbalta refill. Pt . Also said because he was in the hospital a paper had to be filled out and return to [place] before they could refill meds. Pt said that he have been without med fr 3 weeks

## 2019-06-17 NOTE — Telephone Encounter (Signed)
I have re ordered most of his psych meds already earlier in December 05/2019.  I just sent now refill for Duloxetine for him.  If he has paper or needs [place] he can send it to [place] to complete it for him.  [name], DO [facility] 06/17/2019, 10:30 AM

## 2019-07-16 ENCOUNTER — Encounter: Payer: Self-pay | Admitting: Pain Medicine

## 2019-07-16 ENCOUNTER — Telehealth: Payer: Self-pay | Admitting: *Deleted

## 2019-07-16 ENCOUNTER — Telehealth: Payer: Self-pay | Admitting: Pain Medicine

## 2019-07-16 NOTE — Progress Notes (Signed)
Pain relief after procedure (treated area only): (Questions asked to patient) 1. Starting about 15 minutes after the procedure, and "while the area was still numb" (from the local anesthetics), were you having any of your usual pain "in that area" (the treated area)?  (NOTE: NOT including the discomfort from the needle sticks.) First 1 hour: 30 % better. First 4-6 hours: 30 % better.   3. How much better is your pain now, when compared to before the procedure? Current benefit: 20 % better. 4. Can you move better now? Improvement in ROM (Range of Motion): Yes. 5. Can you do more now? Improvement in function: Yes. 4. Did you have any problems with the procedure? Side-effects/Complications: No.

## 2019-07-16 NOTE — Telephone Encounter (Signed)
Returning Nurse call for 07-21-19 appt.

## 2019-07-16 NOTE — Telephone Encounter (Signed)
Returned call. No answer. LVM. 

## 2019-07-16 NOTE — Telephone Encounter (Signed)
Attempted to call for pre appointment review of allergies/meds. Message left. 

## 2019-07-20 NOTE — Progress Notes (Signed)
Patient: Aaron Gibbs  Service Category: E/M  Provider: Gaspar Cola, MD  DOB: 1934/10/31  DOS: 07/21/2019  Location: Office  MRN: [id number]  Setting: Ambulatory outpatient  Referring Provider: [name] *  Type: Established Patient  Specialty: Interventional Pain Management  PCP: Olin Hauser, DO  Location: Remote location  Delivery: TeleHealth     Virtual Encounter - Pain Management PROVIDER NOTE: Information contained herein reflects review and annotations entered in association with encounter. Interpretation of such information and data should be left to medically-trained personnel. Information provided to patient can be located elsewhere in the medical record under "Patient Instructions". Document created using STT-dictation technology, any transcriptional errors that may result from process are unintentional.    Contact & Pharmacy Preferred: [phone] Home: [phone] (home) Mobile: [phone] (mobile) E-mail: No e-mail address on record  [place], [address] [place] [address] Phone: [phone] Fax: [phone]   Pre-screening  Aaron Gibbs offered "in-person" vs "virtual" encounter. He indicated preferring virtual for this encounter.   Reason COVID-19*  Social distancing based on CDC and AMA recommendations.   I contacted Aaron Gibbs on 07/21/2019 via telephone.      I clearly identified myself as Gaspar Cola, MD. I verified that I was speaking with the correct person using two identifiers (Name: Aaron Gibbs, and date of birth: 09/27/1934).  Consent I sought verbal advanced consent from Aaron Gibbs for virtual visit interactions. I informed Aaron Gibbs of possible security and privacy concerns, risks, and limitations associated with providing "not-in-person" medical evaluation and management services. I also informed Aaron Gibbs of the availability of "in-person"  appointments. Finally, I informed him that there would be a charge for the virtual visit and that he could be  personally, fully or partially, financially responsible for it. Aaron Gibbs expressed understanding and agreed to proceed.   Historic Elements   Aaron Gibbs is a 85 y.o. year old, male patient evaluated today after his last encounter by our practice on 07/16/2019. Aaron Gibbs  has a past medical history of Anxiety, Cancer ([place]), COPD (chronic obstructive pulmonary disease) ([place]), Glaucoma, and Osteoporosis. He also  has a past surgical history that includes Cholecystectomy and Transurethral resection of prostate (N/A, 2008). Aaron Gibbs has a current medication list which includes the following prescription(s): albuterol, amlodipine, aspirin ec, diclofenac sodium, diclofenac sodium, duloxetine, fenofibrate, ferrous sulfate, meloxicam, multivitamin with minerals, [START ON 07/26/2019] oxycodone hcl, [START ON 08/25/2019] oxycodone hcl, [START ON 09/24/2019] oxycodone hcl, pantoprazole, prednisolone acetate, pregabalin, quetiapine, timolol, trazodone, and umeclidinium-vilanterol. He  reports that he has quit smoking. He quit after 15.00 years of use. His smokeless tobacco use includes chew. He reports current alcohol use. He reports that he does not use drugs. Aaron Gibbs is allergic to azithromycin.   HPI  Today, he is being contacted for both, medication management and a post-procedure assessment.  According to the patient he has attained at least 50% relief of the pain.  He indicates that it has taken some time, but he can tell a difference. The patient indicates doing well with the current medication regimen. No adverse reactions or side effects reported to the medications.   Post-Procedure Evaluation  Procedure: Therapeutic bilateral genicular nerve RFA #1 under fluoroscopic guidance, and IV sedation Pre-procedure pain level:  7/10 Post-procedure: 0/10 (100% relief)  Sedation:  Sedation provided.  Aaron Shorter, RN  07/16/2019  3:20 PM  Sign when Signing Visit Pain relief after  procedure (treated area only): (Questions asked to patient) 1. Starting about 15 minutes after the procedure, and "while the area was still numb" (from the local anesthetics), were you having any of your usual pain "in that area" (the treated area)?  (NOTE: NOT including the discomfort from the needle sticks.) First 1 hour: 100 % better. First 4-6 hours: 30 % better.   3. How much better is your pain now, when compared to before the procedure? Current benefit: 50 % better. 4. Can you move better now? Improvement in ROM (Range of Motion): Yes. 5. Can you do more now? Improvement in function: Yes. 4. Did you have any problems with the procedure? Side-effects/Complications: No.  Current benefits: Defined as benefit that persist at this time.   Analgesia:  <50% better Function: Aaron Gibbs reports improvement in function ROM: Aaron Gibbs reports improvement in ROM  Pharmacotherapy Assessment  Analgesic: Oxycodone IR 10 mg, 1 tab PO q 8 hrs (30 mg/day of oxycodone) (has enough to last until 02/24/2019) MME/day: 45 mg/day.   Monitoring: Pharmacotherapy: No side-effects or adverse reactions reported. [place] PMP: PDMP reviewed during this encounter.       Compliance: No problems identified. Effectiveness: Clinically acceptable. Plan: Refer to "POC".  UDS:  Summary  Date Value Ref Range Status  07/10/2018 FINAL  Final    Comment:    ==================================================================== TOXASSURE COMP DRUG ANALYSIS,UR ==================================================================== Test                             Result       Flag       Units Drug Present and Declared for Prescription Verification   Morphine                       [id number]       EXPECTED   ng/mg creat   Normorphine                    221          EXPECTED   ng/mg creat    Potential sources of large  amounts of morphine in the absence of    codeine include administration of morphine or use of heroin.    Normorphine is an expected metabolite of morphine.   Hydromorphone                  88           EXPECTED   ng/mg creat    Hydromorphone may be present as a metabolite of morphine;    concentrations of hydromorphone rarely exceed 5% of the morphine    concentration when this is the source of hydromorphone.   Oxycodone                      1094         EXPECTED   ng/mg creat   Oxymorphone                    1028         EXPECTED   ng/mg creat   Noroxycodone                   3205         EXPECTED   ng/mg creat   Noroxymorphone  323          EXPECTED   ng/mg creat    Sources of oxycodone are scheduled prescription medications.    Oxymorphone, noroxycodone, and noroxymorphone are expected    metabolites of oxycodone. Oxymorphone is also available as a    scheduled prescription medication.   Duloxetine                     PRESENT      EXPECTED   Quetiapine                     PRESENT      EXPECTED   Acetaminophen                  PRESENT      EXPECTED Drug Present not Declared for Prescription Verification   Chlorpheniramine               PRESENT      UNEXPECTED   Hydroxyzine                    PRESENT      UNEXPECTED Drug Absent but Declared for Prescription Verification   Pregabalin                     Not Detected UNEXPECTED   Salicylate                     Not Detected UNEXPECTED    Aspirin, as indicated in the declared medication list, is not    always detected even when used as directed. ==================================================================== Test                      Result    Flag   Units      Ref Range   Creatinine              94               mg/dL      >=20 ==================================================================== Declared Medications:  The flagging and interpretation on this report are based on the  following declared medications.   Unexpected results may arise from  inaccuracies in the declared medications.  **Note: The testing scope of this panel includes these medications:  Duloxetine  Morphine (Morphine Sulfate)  Oxycodone (Oxycodone Acetaminophen)  Pregabalin  Quetiapine  **Note: The testing scope of this panel does not include small to  moderate amounts of these reported medications:  Acetaminophen (Oxycodone Acetaminophen)  Aspirin  **Note: The testing scope of this panel does not include following  reported medications:  Amlodipine (Norvasc)  Doxycycline  Fenofibrate  Iron (Ferrous Sulfate)  Meloxicam  Multivitamin  Pantoprazole (Protonix)  Polyethylene Glycol  Prednisolone  Sennosides  Timolol  Umeclidinium (Anoro Ellipta)  Vilanterol (Anoro Ellipta) ==================================================================== For clinical consultation, please call [phone]. ====================================================================    Laboratory Chemistry Profile (12 mo)  Renal: 04/02/2019: BUN 20; Creatinine, Ser 0.67  Lab Results  Component Value Date   GFRAA >60 04/02/2019   GFRNONAA >60 04/02/2019   Hepatic: 03/30/2019: Albumin 3.1 Lab Results  Component Value Date   AST 45 (H) 03/30/2019   ALT 56 (H) 03/30/2019   Other: No results found for requested labs within last 8760 hours.  Note: Above Lab results reviewed.  Imaging  Fluoro (C-Arm) (<60 min) (No Report) Fluoro was used, but no Radiologist interpretation will be provided.  Please refer to "NOTES" tab  for provider progress note.   Assessment  The encounter diagnosis was Chronic pain syndrome.  Plan of Care  Problem-specific:  No problem-specific Assessment & Plan notes found for this encounter.  I have discontinued Aaron Gibbs "Bill"'s traMADol and traMADol. I am also having him maintain his pantoprazole, amLODipine, umeclidinium-vilanterol, ferrous sulfate, fenofibrate, aspirin EC, multivitamin with  minerals, prednisoLONE acetate, timolol, diclofenac sodium, pregabalin, meloxicam, albuterol, QUEtiapine, traZODone, Oxycodone HCl, Oxycodone HCl, diclofenac Sodium, DULoxetine, and Oxycodone HCl.  Pharmacotherapy (Medications Ordered): Meds ordered this encounter  Medications  . Oxycodone HCl 10 MG TABS    Sig: Take 1 tablet (10 mg total) by mouth 3 (three) times daily as needed. Must last 30 days.    Dispense:  90 tablet    Refill:  0    Chronic Pain: STOP Act (Not applicable) Fill 1 day early if closed on refill date. Do not fill until: 09/24/2019. To last until: 10/24/2019. Avoid benzodiazepines within 8 hours of opioids   Orders:  No orders of the defined types were placed in this encounter.  Follow-up plan:   Return in about 3 months (around 10/22/2019) for (VV), (MM).      Interventional management options: Considering: Diagnostic bilateral genicular nerve block #1(today) Possible bilateral genicular nerve RFA   PRN Procedures: Palliative bilateral IA Hyalgan knee injections S3/N1  Palliative bilateral IA knee injections (w/ steroids) #1     Recent Visits Date Type Provider Dept  06/10/19 Procedure visit Milinda Pointer, MD [facility]  Showing recent visits within past 90 days and meeting all other requirements   Today's Visits Date Type Provider Dept  07/21/19 Telemedicine Milinda Pointer, MD [facility]  Showing today's visits and meeting all other requirements   Future Appointments Date Type Provider Dept  09/24/19 Appointment Milinda Pointer, MD [facility]  Showing future appointments within next 90 days and meeting all other requirements   I discussed the assessment and treatment plan with the patient. The patient was provided an opportunity to ask questions and all were answered. The patient agreed with the plan and demonstrated an understanding of the instructions.  Patient advised to call back or seek an in-person  evaluation if the symptoms or condition worsens.  Duration of encounter: 13 minutes.  Note by: Gaspar Cola, MD Date: 07/21/2019; Time: 9:08 AM

## 2019-07-21 ENCOUNTER — Ambulatory Visit: Payer: Medicare Other | Attending: Pain Medicine | Admitting: Pain Medicine

## 2019-07-21 ENCOUNTER — Other Ambulatory Visit: Payer: Self-pay

## 2019-07-21 DIAGNOSIS — G894 Chronic pain syndrome: Secondary | ICD-10-CM | POA: Diagnosis not present

## 2019-07-21 MED ORDER — OXYCODONE HCL 10 MG PO TABS: 10.0000 mg | ORAL_TABLET | Freq: Three times a day (TID) | ORAL | 0 refills | Status: DC | PRN

## 2019-08-28 ENCOUNTER — Other Ambulatory Visit: Payer: Self-pay | Admitting: Pain Medicine

## 2019-08-28 ENCOUNTER — Other Ambulatory Visit: Payer: Self-pay | Admitting: Family Medicine

## 2019-08-28 DIAGNOSIS — M792 Neuralgia and neuritis, unspecified: Secondary | ICD-10-CM

## 2019-08-28 DIAGNOSIS — F332 Major depressive disorder, recurrent severe without psychotic features: Secondary | ICD-10-CM

## 2019-08-28 DIAGNOSIS — F5104 Psychophysiologic insomnia: Secondary | ICD-10-CM

## 2019-09-02 ENCOUNTER — Telehealth: Payer: Self-pay | Admitting: Family Medicine

## 2019-09-02 NOTE — Telephone Encounter (Signed)
   KNB 09/02/2019 1st attempt  Name: [name]   MRN: [id number]   DOB: Sep 11, 1934   AGE: 85 y.o.   GENDER: male   PCP Olin Hauser, DO.   Called pt regarding Community Resource Referral for Transportation [facility]MT[facility] Follow up on: 09/02/19  [place] . [email or web address]  [phone]

## 2019-09-03 ENCOUNTER — Other Ambulatory Visit: Payer: Self-pay

## 2019-09-03 ENCOUNTER — Ambulatory Visit: Payer: Medicare Other | Admitting: Family Medicine

## 2019-09-03 NOTE — Telephone Encounter (Signed)
   KNB 09/03/2019 2nd attempt  Name: Aaron Gibbs   MRN: [id number]   DOB: Aug 20, 1934   AGE: 85 y.o.   GENDER: male   PCP Olin Hauser, DO.   Called pt regarding Community Resource Referral for transportation  [facility] Follow up on: 09/04/19  [place] . [email or web address]  [phone]

## 2019-09-04 NOTE — Telephone Encounter (Signed)
° °  KNB 09/04/2019 3rd Attempt   Name: Aaron Gibbs    MRN: [id number]    DOB: July 16, 1934    AGE: 85 y.o.    GENDER: male    PCP Olin Hauser, DO.   Called pt regarding Community Resource Referral for Left message with my contact information should he need assistance. Closing referral pending any other needs of patient.  [place] [facility] [email or web address]   [id number]

## 2019-09-05 ENCOUNTER — Encounter: Payer: Self-pay | Admitting: Family Medicine

## 2019-09-05 ENCOUNTER — Ambulatory Visit (INDEPENDENT_AMBULATORY_CARE_PROVIDER_SITE_OTHER): Payer: Medicare Other | Admitting: Family Medicine

## 2019-09-05 ENCOUNTER — Other Ambulatory Visit: Payer: Self-pay

## 2019-09-05 VITALS — BP 105/66 | HR 67 | Temp 98.4°F | Resp 16

## 2019-09-05 DIAGNOSIS — G894 Chronic pain syndrome: Secondary | ICD-10-CM | POA: Diagnosis not present

## 2019-09-05 DIAGNOSIS — F332 Major depressive disorder, recurrent severe without psychotic features: Secondary | ICD-10-CM | POA: Diagnosis not present

## 2019-09-05 DIAGNOSIS — F5104 Psychophysiologic insomnia: Secondary | ICD-10-CM | POA: Diagnosis not present

## 2019-09-05 DIAGNOSIS — J432 Centrilobular emphysema: Secondary | ICD-10-CM | POA: Diagnosis not present

## 2019-09-05 DIAGNOSIS — G3184 Mild cognitive impairment, so stated: Secondary | ICD-10-CM

## 2019-09-05 DIAGNOSIS — R410 Disorientation, unspecified: Secondary | ICD-10-CM

## 2019-09-05 NOTE — Patient Instructions (Addendum)
Thank you for coming to the office today.  Referral to Neuro specialist in [place] to do more testing on cognitive function memory hallucinations and may do brain imaging and help [place] understand if it is related to mental health or medicines or like a dementia  [facility] [address] [place], [place] [address] Phone: [phone]  ----- We can refer to [facility] chronic care management team - Pam (Nurse), Grayland Ormond (pharmacist), and maybe social worker Brooke.  -------------------------  Talk with [facility] about medications concern with side effects possibly hallucinations. We thought that it would be good to stop Wellbutrin back in 05/2019 but now if back on it, the question is - do we still need it or can other meds be adjusted?  If coughing breathing is worse - call or message we can do a work in [employer] here next week and consider treatment.   Please schedule a Follow-up Appointment to: Return in about 3 months (around 12/06/2019) for 3 month follow-up Mental Health / Neuro / Pain.  If you have any other questions or concerns, please feel free to call the office or send a message through [place]. You may also schedule an earlier appointment if necessary.  Additionally, you may be receiving a survey about your experience at our office within a few days to 1 week by e-mail or mail. We value your feedback.  [name], DO [place]

## 2019-09-05 NOTE — Progress Notes (Addendum)
Subjective:    Patient ID: Aaron Gibbs, male    DOB: 12-04-1934, 85 y.o.   MRN: [id number]  Aaron Gibbs is a 85 y.o. male presenting on 09/05/2019 for Fall, Headache, Hallucinations (as per patient Lyrica causing it), and Dehydration (last week but as per daughter will not go to the hospital)   History is provided by patient, daughter Miguelangel Tray and caregiver.  HPI   Centrilobular Emphysema Chronic problem. Has had some increased dyspnea at times and coughing. But overall seems to be doing better now. Had worse cough prior to apt. Asked about chest x-ray now doing better today. He is on Anoro and Albuterol PRN. Last flare up on levaquin didn't quite work as it had in the past.  Hallucinations / Major recurrent depression / Memory Loss / Suspected Cognitive Impairment Followed by [facility] on med management He has complex history with some severe behavioral health concerns. He has had issues in past >3-6 months or longer with hallucinations, primarily night time associated with sleep and waking up. He has occasional daytime hallucinations but not always. He says he is aware that these are not real but family members don't always agree with that. Primarilyi night time symptom, otherwise not bothering him. - They thought that medications were contributing. Previous discussion on this back in 05/2019 ultimately it was agreed to discontinue Wellbutrin, to avoid polypharmacy and perhaps med side effect with insomnia from that med, they thought initially lyrica for his back pain was contributing, and he has had inc falls etc. Discussion previously with his pain management ultimately we agreed to DC wellbutrin and limit some of the psychotropic medications. - Today he is still having same hallucination issue. - It is reported that he is still actually taking Wellbutrin. Unsure why it was not stopped. - He did stop Lyrica 2 weeks ago and didn't notice significant improvement. -  Regarding memory loss and some concerns for cognition, he has had some confusion at times in apartment at home, getting lost in familiar places, memory loss  Additional complaint Admits poor vision, says he was seen at [facility] but he is requesting to see ophthalmologist. He had difficulty focusing with vision he is awaiting upcoming eye apt  Depression screen [facility] 2/9 09/05/2019 06/10/2019 05/27/2019  Decreased Interest 1 0 0  Down, Depressed, Hopeless 1 0 1  PHQ - 2 Score 2 0 1  Altered sleeping 2 - 3  Tired, decreased energy 3 - 3  Change in appetite 1 - 2  Feeling bad or failure about yourself  0 - 0  Trouble concentrating 1 - 1  Moving slowly or fidgety/restless 0 - 0  Suicidal thoughts 0 - 0  PHQ-9 Score 9 - 10  Difficult doing work/chores Somewhat difficult - Somewhat difficult  Some recent data might be hidden    Social History   Tobacco Use  . Smoking status: Former Smoker    Years: 15.00  . Smokeless tobacco: Current User    Types: Chew  . Tobacco comment: stopped 15 years ago  Substance Use Topics  . Alcohol use: Yes    Comment: past  . Drug use: Never    Review of Systems Per HPI unless specifically indicated above     Objective:    BP 105/66   Pulse 67   Temp 98.4 F (36.9 C) (Oral)   Resp 16   SpO2 98%   Wt Readings from Last 3 Encounters:  06/10/19 240 lb (108.9 kg)  04/09/19  255 lb (115.7 kg)  03/31/19 236 lb 12.4 oz (107.4 kg)    Physical Exam Vitals and nursing note reviewed.  Constitutional:      General: He is not in acute distress.    Appearance: He is well-developed.     Comments: Chronically ill 85 yr old male, mostly comfortable, cooperative, has walker  HENT:     Head: Normocephalic and atraumatic.  Eyes:     General:        Right eye: No discharge.        Left eye: No discharge.     Conjunctiva/sclera: Conjunctivae normal.  Cardiovascular:     Rate and Rhythm: Normal rate and regular rhythm.     Heart sounds: Normal heart  sounds. No murmur.  Pulmonary:     Effort: Pulmonary effort is normal. No respiratory distress.     Breath sounds: No wheezing or rales.     Comments: Upper lung fields w good air movement today. Mild reduced breath sounds bilateral bases. No focal rhonchi or wheezing. Musculoskeletal:     Cervical back: Normal range of motion and neck supple.     Comments: Using walker for ambulation.  Able to stand from seated position.  Skin:    General: Skin is warm.     Findings: No erythema or rash.  Neurological:     Mental Status: He is alert and oriented to person, place, and time.  Psychiatric:        Behavior: Behavior normal.     Comments: Well groomed, good eye contact, normal speech and thoughts. Mood is good today      Current Outpatient Medications:  .  albuterol (VENTOLIN HFA) 108 (90 Base) MCG/ACT inhaler, Inhale 1-2 puffs into the lungs every 6 (six) hours as needed for wheezing or shortness of breath., Disp: 8 g, Rfl: 2 .  amLODipine (NORVASC) 10 MG tablet, Take 10 mg by mouth daily., Disp: , Rfl:  .  diclofenac sodium (VOLTAREN) 1 % GEL, Apply 1 application topically as needed., Disp: , Rfl:  .  DULoxetine (CYMBALTA) 60 MG capsule, Take 1 capsule (60 mg total) by mouth daily., Disp: 90 capsule, Rfl: 1 .  fenofibrate (TRICOR) 145 MG tablet, Take 145 mg by mouth daily., Disp: , Rfl:  .  ferrous sulfate 325 (65 FE) MG tablet, Take 325 mg by mouth daily with breakfast., Disp: , Rfl:  .  meloxicam (MOBIC) 15 MG tablet, Take 1 tablet (15 mg total) by mouth daily., Disp: 30 tablet, Rfl: 5 .  Multiple Vitamin (MULTIVITAMIN WITH MINERALS) TABS tablet, Take 1 tablet by mouth daily., Disp: , Rfl:  .  Oxycodone HCl 10 MG TABS, Take 1 tablet (10 mg total) by mouth 3 (three) times daily as needed. Must last 30 days., Disp: 90 tablet, Rfl: 0 .  prednisoLONE acetate (PRED FORTE) 1 % ophthalmic suspension, Place 2 drops into the left eye daily., Disp: 5 mL, Rfl: 0 .  QUEtiapine (SEROQUEL) 100 MG  tablet, TAKE 1 TABLET BY MOUTH AT BEDTIME, Disp: 90 tablet, Rfl: 1 .  timolol (TIMOPTIC) 0.5 % ophthalmic solution, , Disp: , Rfl:  .  traZODone (DESYREL) 50 MG tablet, TAKE 1 TABLET BY MOUTH AT BEDTIME, Disp: 90 tablet, Rfl: 1 .  umeclidinium-vilanterol (ANORO ELLIPTA) 62.5-25 MCG/INH AEPB, Inhale 1 puff into the lungs daily., Disp: , Rfl:  .  aspirin EC 81 MG tablet, Take 81 mg by mouth daily., Disp: , Rfl:  .  bacitracin ophthalmic ointment, , Disp: ,  Rfl:  .  buPROPion (WELLBUTRIN SR) 150 MG 12 hr tablet, Take 150 mg by mouth 2 (two) times daily., Disp: , Rfl:  .  diclofenac Sodium (VOLTAREN) 1 % GEL, , Disp: , Rfl:  .  Oxycodone HCl 10 MG TABS, Take 1 tablet (10 mg total) by mouth 3 (three) times daily as needed. Must last 30 days., Disp: 90 tablet, Rfl: 0 .  [START ON 09/24/2019] Oxycodone HCl 10 MG TABS, Take 1 tablet (10 mg total) by mouth 3 (three) times daily as needed. Must last 30 days., Disp: 90 tablet, Rfl: 0 .  pantoprazole (PROTONIX) 40 MG tablet, Take 40 mg by mouth daily. , Disp: , Rfl:  .  pregabalin (LYRICA) 150 MG capsule, Take 1 capsule (150 mg total) by mouth 3 (three) times daily. Must last 30 days (Patient not taking: Reported on 09/05/2019), Disp: 90 capsule, Rfl: 5    Results for orders placed or performed during the hospital encounter of 03/28/19  SARS Coronavirus 2 by RT PCR (hospital order, performed in [place] hospital lab) Nasopharyngeal Nasopharyngeal Swab   Specimen: Nasopharyngeal Swab  Result Value Ref Range   SARS Coronavirus 2 NEGATIVE NEGATIVE  MRSA PCR Screening   Specimen: Nasopharyngeal  Result Value Ref Range   MRSA by PCR NEGATIVE NEGATIVE  Culture, blood (Routine X 2) w Reflex to ID Panel   Specimen: BLOOD  Result Value Ref Range   Specimen Description BLOOD R HAND    Special Requests      BOTTLES DRAWN AEROBIC ONLY Blood Culture adequate volume   Culture      NO GROWTH 5 DAYS Performed at [facility], [address]  [place], [place] [address]    Report Status 04/03/2019 FINAL   Culture, blood (Routine X 2) w Reflex to ID Panel   Specimen: BLOOD  Result Value Ref Range   Specimen Description BLOOD RFOA    Special Requests      BOTTLES DRAWN AEROBIC AND ANAEROBIC Blood Culture adequate volume   Culture      NO GROWTH 5 DAYS Performed at [facility], [address] [place], [place] [address]    Report Status 04/03/2019 FINAL   Culture, respiratory (non-expectorated)   Specimen: Tracheal Aspirate; Respiratory  Result Value Ref Range   Specimen Description      TRACHEAL ASPIRATE Performed at [facility], [address] [place], [place] [address]    Special Requests      NONE Performed at [facility], [place], [place] [address]    Gram Stain      MODERATE WBC PRESENT, PREDOMINANTLY PMN MODERATE GRAM POSITIVE COCCI IN CLUSTERS RARE GRAM NEGATIVE RODS    Culture      FEW Consistent with normal respiratory flora. Performed at [facility], [place] [address] [place], [place] [address]    Report Status 04/01/2019 FINAL   Urine Culture   Specimen: Urine, Random  Result Value Ref Range   Specimen Description      URINE, RANDOM Performed at [facility], [address], [place], [place] [address]    Special Requests      NONE Performed at [facility], [address], [place], [place] [address]    Culture      NO GROWTH Performed at [facility], [place] [address], [place], [place] [address]    Report Status 03/30/2019 FINAL   Comprehensive metabolic panel  Result Value Ref Range  Sodium 140 135 - 145 mmol/L   Potassium 4.5 3.5 - 5.1 mmol/L   Chloride 109 98 - 111 mmol/L   CO2 19 (L) 22 - 32 mmol/L   Glucose, Bld 145 (H) 70 - 99 mg/dL   BUN 33 (H) 8 - 23 mg/dL   Creatinine, Ser 1.21 0.61 - 1.24 mg/dL   Calcium 9.3 8.9 - 10.3 mg/dL   Total Protein 7.4 6.5 - 8.1 g/dL   Albumin 3.4 (L) 3.5 - 5.0 g/dL   AST 52  (H) 15 - 41 U/L   ALT 45 (H) 0 - 44 U/L   Alkaline Phosphatase 51 38 - 126 U/L   Total Bilirubin 0.8 0.3 - 1.2 mg/dL   GFR calc non Af Amer 55 (L) >60 mL/min   GFR calc Af Amer >60 >60 mL/min   Anion gap 12 5 - 15  CBC with Differential  Result Value Ref Range   WBC 7.7 4.0 - 10.5 K/uL   RBC 4.51 4.22 - 5.81 MIL/uL   Hemoglobin 13.3 13.0 - 17.0 g/dL   HCT 41.7 39.0 - 52.0 %   MCV 92.5 80.0 - 100.0 fL   MCH 29.5 26.0 - 34.0 pg   MCHC 31.9 30.0 - 36.0 g/dL   RDW 15.9 (H) 11.5 - 15.5 %   Platelets 179 150 - 400 K/uL   nRBC 0.0 0.0 - 0.2 %   Neutrophils Relative % 63 %   Neutro Abs 4.8 1.7 - 7.7 K/uL   Lymphocytes Relative 18 %   Lymphs Abs 1.4 0.7 - 4.0 K/uL   Monocytes Relative 13 %   Monocytes Absolute 1.0 0.1 - 1.0 K/uL   Eosinophils Relative 5 %   Eosinophils Absolute 0.4 0.0 - 0.5 K/uL   Basophils Relative 0 %   Basophils Absolute 0.0 0.0 - 0.1 K/uL   Immature Granulocytes 1 %   Abs Immature Granulocytes 0.04 0.00 - 0.07 K/uL  Brain natriuretic peptide  Result Value Ref Range   B Natriuretic Peptide 52.0 0.0 - 100.0 pg/mL  Urinalysis, Complete w Microscopic  Result Value Ref Range   Color, Urine YELLOW (A) YELLOW   APPearance HAZY (A) CLEAR   Specific Gravity, Urine 1.025 1.005 - 1.030   pH 5.0 5.0 - 8.0   Glucose, UA NEGATIVE NEGATIVE mg/dL   Hgb urine dipstick NEGATIVE NEGATIVE   Bilirubin Urine NEGATIVE NEGATIVE   Ketones, ur NEGATIVE NEGATIVE mg/dL   Protein, ur 30 (A) NEGATIVE mg/dL   Nitrite NEGATIVE NEGATIVE   Leukocytes,Ua MODERATE (A) NEGATIVE   RBC / HPF 0-5 0 - 5 RBC/hpf   WBC, UA >50 (H) 0 - 5 WBC/hpf   Bacteria, UA NONE SEEN NONE SEEN   Squamous Epithelial / LPF 0-5 0 - 5   Mucus PRESENT   Lactic acid, plasma  Result Value Ref Range   Lactic Acid, Venous 1.9 0.5 - 1.9 mmol/L  Blood gas, arterial ([facility] & [facility] ONLY)  Result Value Ref Range   FIO2 0.50    VT 500 mL   Peep/cpap 5.0 cm H20   pH, Arterial 7.28 (L) 7.350 - 7.450   pCO2 arterial 47 32.0  - 48.0 mmHg   pO2, Arterial 113 (H) 83.0 - 108.0 mmHg   Bicarbonate 22.1 20.0 - 28.0 mmol/L   Acid-base deficit 4.8 (H) 0.0 - 2.0 mmol/L   O2 Saturation 97.8 %   Patient temperature 37.0    Collection site RIGHT RADIAL    Sample type ARTERIAL DRAW  Allens test (pass/fail) PASS PASS   Mechanical Rate 20   Legionella Pneumophila Serogp 1 Ur Ag  Result Value Ref Range   L. pneumophila Serogp 1 Ur Ag Negative Negative   Source of Sample URINE, RANDOM   Strep pneumoniae urinary antigen  (not at [facility])  Result Value Ref Range   Strep Pneumo Urinary Antigen NEGATIVE NEGATIVE  Procalcitonin  Result Value Ref Range   Procalcitonin 0.26 ng/mL  CBC  Result Value Ref Range   WBC 7.1 4.0 - 10.5 K/uL   RBC 4.54 4.22 - 5.81 MIL/uL   Hemoglobin 13.4 13.0 - 17.0 g/dL   HCT 42.5 39.0 - 52.0 %   MCV 93.6 80.0 - 100.0 fL   MCH 29.5 26.0 - 34.0 pg   MCHC 31.5 30.0 - 36.0 g/dL   RDW 15.9 (H) 11.5 - 15.5 %   Platelets 169 150 - 400 K/uL   nRBC 0.0 0.0 - 0.2 %  Basic metabolic panel  Result Value Ref Range   Sodium 142 135 - 145 mmol/L   Potassium 4.5 3.5 - 5.1 mmol/L   Chloride 113 (H) 98 - 111 mmol/L   CO2 21 (L) 22 - 32 mmol/L   Glucose, Bld 179 (H) 70 - 99 mg/dL   BUN 35 (H) 8 - 23 mg/dL   Creatinine, Ser 1.14 0.61 - 1.24 mg/dL   Calcium 9.3 8.9 - 10.3 mg/dL   GFR calc non Af Amer 60 (L) >60 mL/min   GFR calc Af Amer >60 >60 mL/min   Anion gap 8 5 - 15  HIV Antibody (routine testing w rflx)  Result Value Ref Range   HIV Screen 4th Generation wRfx NON REACTIVE NON REACTIVE  Triglycerides  Result Value Ref Range   Triglycerides 187 (H) <150 mg/dL  Magnesium  Result Value Ref Range   Magnesium 2.1 1.7 - 2.4 mg/dL  Phosphorus  Result Value Ref Range   Phosphorus 4.0 2.5 - 4.6 mg/dL  Urine Drug Screen, Qualitative ([facility] only)  Result Value Ref Range   Tricyclic, Ur Screen POSITIVE (A) NONE DETECTED   Amphetamines, Ur Screen NONE DETECTED NONE DETECTED   MDMA (Ecstasy)Ur Screen  NONE DETECTED NONE DETECTED   Cocaine Metabolite,Ur [place] NONE DETECTED NONE DETECTED   Opiate, Ur Screen POSITIVE (A) NONE DETECTED   Phencyclidine (PCP) Ur S NONE DETECTED NONE DETECTED   Cannabinoid 50 Ng, Ur [place] NONE DETECTED NONE DETECTED   Barbiturates, Ur Screen NONE DETECTED NONE DETECTED   Benzodiazepine, Ur Scrn POSITIVE (A) NONE DETECTED   Methadone Scn, Ur NONE DETECTED NONE DETECTED  Glucose, capillary  Result Value Ref Range   Glucose-Capillary 178 (H) 70 - 99 mg/dL  Blood gas, arterial  Result Value Ref Range   FIO2 0.50    Delivery systems VENTILATOR    Mode PRESSURE REGULATED VOLUME CONTROL    VT 500 mL   Peep/cpap 5.0 cm H20   pH, Arterial 7.38 7.350 - 7.450   pCO2 arterial 38 32.0 - 48.0 mmHg   pO2, Arterial 135 (H) 83.0 - 108.0 mmHg   Bicarbonate 22.5 20.0 - 28.0 mmol/L   Acid-base deficit 2.3 (H) 0.0 - 2.0 mmol/L   O2 Saturation 99.0 %   Patient temperature 37.0    Collection site RIGHT RADIAL    Sample type ARTERIAL DRAW    Allens test (pass/fail) PASS PASS   Mechanical Rate 20   Glucose, capillary  Result Value Ref Range   Glucose-Capillary 119 (H) 70 - 99 mg/dL  Glucose, capillary  Result Value Ref Range   Glucose-Capillary 155 (H) 70 - 99 mg/dL  Triglycerides  Result Value Ref Range   Triglycerides 228 (H) <150 mg/dL  Procalcitonin  Result Value Ref Range   Procalcitonin 0.14 ng/mL  CBC  Result Value Ref Range   WBC 5.7 4.0 - 10.5 K/uL   RBC 4.14 (L) 4.22 - 5.81 MIL/uL   Hemoglobin 12.1 (L) 13.0 - 17.0 g/dL   HCT 39.2 39.0 - 52.0 %   MCV 94.7 80.0 - 100.0 fL   MCH 29.2 26.0 - 34.0 pg   MCHC 30.9 30.0 - 36.0 g/dL   RDW 15.8 (H) 11.5 - 15.5 %   Platelets 157 150 - 400 K/uL   nRBC 0.0 0.0 - 0.2 %  Comprehensive metabolic panel  Result Value Ref Range   Sodium 142 135 - 145 mmol/L   Potassium 4.0 3.5 - 5.1 mmol/L   Chloride 111 98 - 111 mmol/L   CO2 19 (L) 22 - 32 mmol/L   Glucose, Bld 167 (H) 70 - 99 mg/dL   BUN 24 (H) 8 - 23 mg/dL    Creatinine, Ser 0.85 0.61 - 1.24 mg/dL   Calcium 9.0 8.9 - 10.3 mg/dL   Total Protein 6.6 6.5 - 8.1 g/dL   Albumin 3.1 (L) 3.5 - 5.0 g/dL   AST 45 (H) 15 - 41 U/L   ALT 56 (H) 0 - 44 U/L   Alkaline Phosphatase 45 38 - 126 U/L   Total Bilirubin 0.7 0.3 - 1.2 mg/dL   GFR calc non Af Amer >60 >60 mL/min   GFR calc Af Amer >60 >60 mL/min   Anion gap 12 5 - 15  Magnesium  Result Value Ref Range   Magnesium 2.2 1.7 - 2.4 mg/dL  Phosphorus  Result Value Ref Range   Phosphorus 3.4 2.5 - 4.6 mg/dL  Glucose, capillary  Result Value Ref Range   Glucose-Capillary 175 (H) 70 - 99 mg/dL  Glucose, capillary  Result Value Ref Range   Glucose-Capillary 154 (H) 70 - 99 mg/dL  Procalcitonin  Result Value Ref Range   Procalcitonin <0.10 ng/mL  Glucose, capillary  Result Value Ref Range   Glucose-Capillary 120 (H) 70 - 99 mg/dL  Glucose, capillary  Result Value Ref Range   Glucose-Capillary 152 (H) 70 - 99 mg/dL   Comment 1 Notify RN    Comment 2 Document in Chart   Basic metabolic panel  Result Value Ref Range   Sodium 143 135 - 145 mmol/L   Potassium 3.9 3.5 - 5.1 mmol/L   Chloride 115 (H) 98 - 111 mmol/L   CO2 23 22 - 32 mmol/L   Glucose, Bld 124 (H) 70 - 99 mg/dL   BUN 23 8 - 23 mg/dL   Creatinine, Ser 0.69 0.61 - 1.24 mg/dL   Calcium 9.0 8.9 - 10.3 mg/dL   GFR calc non Af Amer >60 >60 mL/min   GFR calc Af Amer >60 >60 mL/min   Anion gap 5 5 - 15  Glucose, capillary  Result Value Ref Range   Glucose-Capillary 125 (H) 70 - 99 mg/dL  Glucose, capillary  Result Value Ref Range   Glucose-Capillary 178 (H) 70 - 99 mg/dL  Glucose, capillary  Result Value Ref Range   Glucose-Capillary 109 (H) 70 - 99 mg/dL  Glucose, capillary  Result Value Ref Range   Glucose-Capillary 112 (H) 70 - 99 mg/dL   Comment 1 Notify RN   Glucose, capillary  Result Value Ref Range   Glucose-Capillary 88 70 - 99 mg/dL  Basic metabolic panel  Result Value Ref Range   Sodium 138 135 - 145 mmol/L    Potassium 3.8 3.5 - 5.1 mmol/L   Chloride 106 98 - 111 mmol/L   CO2 23 22 - 32 mmol/L   Glucose, Bld 187 (H) 70 - 99 mg/dL   BUN 22 8 - 23 mg/dL   Creatinine, Ser 0.80 0.61 - 1.24 mg/dL   Calcium 9.2 8.9 - 10.3 mg/dL   GFR calc non Af Amer >60 >60 mL/min   GFR calc Af Amer >60 >60 mL/min   Anion gap 9 5 - 15  Magnesium  Result Value Ref Range   Magnesium 2.0 1.7 - 2.4 mg/dL  Glucose, capillary  Result Value Ref Range   Glucose-Capillary 100 (H) 70 - 99 mg/dL  Basic metabolic panel  Result Value Ref Range   Sodium 142 135 - 145 mmol/L   Potassium 3.5 3.5 - 5.1 mmol/L   Chloride 107 98 - 111 mmol/L   CO2 24 22 - 32 mmol/L   Glucose, Bld 122 (H) 70 - 99 mg/dL   BUN 20 8 - 23 mg/dL   Creatinine, Ser 0.67 0.61 - 1.24 mg/dL   Calcium 9.1 8.9 - 10.3 mg/dL   GFR calc non Af Amer >60 >60 mL/min   GFR calc Af Amer >60 >60 mL/min   Anion gap 11 5 - 15  Magnesium  Result Value Ref Range   Magnesium 2.3 1.7 - 2.4 mg/dL  Troponin I (High Sensitivity)  Result Value Ref Range   Troponin I (High Sensitivity) 11 <18 ng/L      Assessment & Plan:   Problem List Items Addressed This Visit    Major depressive disorder, recurrent episode, severe (HCC) - Primary   Relevant Medications   buPROPion (WELLBUTRIN SR) 150 MG 12 hr tablet   Other Relevant Orders   Ambulatory referral to Chronic Care Management Services   Ambulatory referral to Neurology   Chronic pain syndrome (Chronic)   Relevant Medications   buPROPion (WELLBUTRIN SR) 150 MG 12 hr tablet   Other Relevant Orders   Ambulatory referral to Chronic Care Management Services   Centrilobular emphysema ([place])   Relevant Orders   Ambulatory referral to Chronic Care Management Services    Other Visit Diagnoses    Psychophysiological insomnia       Relevant Orders   Ambulatory referral to Chronic Care Management Services   Ambulatory referral to Neurology   Confusion       Relevant Orders   Ambulatory referral to Neurology    Mild cognitive impairment with memory loss       Relevant Orders   Ambulatory referral to Chronic Care Management Services   Ambulatory referral to Neurology     concerns with complex geriatric patient age 47 with significant mental and physical chronic impairments, he has severe major depression, and chronic pain with COPD, he is on many significant medications from [facility] and [facility], our primary concerns are coordinating with his specialists and med rec and polypharmacy  - Prior med wellbutrin was discontinued 05/2019 then he has restarted it since, this was thought to be contributing to hallucinations / insomnia with polypharmacy, but there has been confusion on med rec with multiple specialists  For now, no medication change. He can review meds w/ Psychiatry soon at upcoming apt.  - Follow up w [facility], we can fax record  to them to keep them updated regarding this recent evaluation.  - Referral to [facility] for neuropsych eval and further work-up for possible cognitive impairment, / memory loss confusion hallucinations, to determine if neurological cause or if secondary to mental health or medications polypharmacy  - Referral to Chronic Care Management CCM / nurse case manager / social work / Software engineer, see referral for full details, goal for med rec and evaluation of needed resources - His daughters / caregiver would benefit from additional coordination of care due to complexity of his specialist appointments. Likely other resources needed for home care or other level of care in future.    #COPD Currently no flare of AECOPD He can continue current maintenance therapy Follow-up or notify us if worsening we can place CXR order for him and eval 1-2 weeks if needed  Orders Placed This Encounter  Procedures  . Ambulatory referral to Chronic Care Management Services    Referral Priority:   Routine    Referral Type:   Consultation    Referral  Reason:   Care Coordination    Number of Visits Requested:   1  . Ambulatory referral to Neurology    Referral Priority:   Routine    Referral Type:   Consultation    Referral Reason:   Specialty Services Required    Requested Specialty:   Neurology    Number of Visits Requested:   1     No orders of the defined types were placed in this encounter.     Follow up plan: Return in about 3 months (around 12/06/2019) for 3 month follow-up Mental Health / Neuro / Pain.   [name], [facility] 09/05/2019, 12:06 PM

## 2019-09-05 NOTE — Addendum Note (Signed)
Addended by: Olin Hauser on: 09/05/2019 06:05 PM   Modules accepted: Orders

## 2019-09-08 ENCOUNTER — Telehealth: Payer: Self-pay | Admitting: Family Medicine

## 2019-09-08 NOTE — Chronic Care Management (AMB) (Signed)
  Chronic Care Management   Outreach Note  09/08/2019 Name: Aaron Gibbs MRN: [id number] DOB: 1935-02-13  Aaron Gibbs is a 85 y.o. year old male who is a primary care patient of Olin Hauser, DO. I reached out to [name] by phone today in response to a referral sent by [name] PCP, Dr. [name]     An unsuccessful telephone outreach was attempted today. The patient was referred to the case management team for assistance with care management and care coordination.   Follow Up Plan: A HIPPA compliant phone message was left for the patient providing contact information and requesting a return call.  The care management team will reach out to the patient again over the next 7 days.  If patient returns call to provider office, please advise to call Embedded Care Management Care Guide Glenna Durand LPN at [phone]  Fama Muenchow, LPN Health Advisor, [facility] ??[email or web address] ??[phone]

## 2019-09-08 NOTE — Chronic Care Management (AMB) (Signed)
  Chronic Care Management   Note  09/08/2019 Name: Aaron Gibbs MRN: [id number] DOB: Jun 04, 1935  Aaron Gibbs is a 85 y.o. year old male who is a primary care patient of Aaron Hauser, Aaron Gibbs. I reached out to Aaron Gibbs by phone today in response to a referral sent by Aaron Gibbs PCP, Aaron Putnam Aaron Gibbs     Mr. Joung was given information about Chronic Care Management services today including:  1. CCM service includes personalized support from designated clinical staff supervised by his physician, including individualized plan of care and coordination with other care providers 2. 24/7 contact phone numbers for assistance for urgent and routine care needs. 3. Service will only be billed when office clinical staff spend 20 minutes or more in a month to coordinate care. 4. Only one practitioner may furnish and bill the service in a calendar month. 5. The patient may stop CCM services at any time (effective at the end of the month) by phone call to the office staff. 6. The patient will be responsible for cost sharing (co-pay) of up to 20% of the service fee (after annual deductible is met).  Patient's daughter agreed to services and verbal consent obtained.   Follow up plan: Telephone appointment with care management team member scheduled for:09/29/2019  Aaron Gibbs, [place] [place] [facility] ??[email or web address] ??[phone]

## 2019-09-11 ENCOUNTER — Ambulatory Visit (INDEPENDENT_AMBULATORY_CARE_PROVIDER_SITE_OTHER): Payer: Medicare Other | Admitting: General Practice

## 2019-09-11 DIAGNOSIS — R55 Syncope and collapse: Secondary | ICD-10-CM

## 2019-09-11 DIAGNOSIS — J441 Chronic obstructive pulmonary disease with (acute) exacerbation: Secondary | ICD-10-CM

## 2019-09-11 DIAGNOSIS — I10 Essential (primary) hypertension: Secondary | ICD-10-CM

## 2019-09-11 DIAGNOSIS — H543 Unqualified visual loss, both eyes: Secondary | ICD-10-CM

## 2019-09-11 DIAGNOSIS — Z789 Other specified health status: Secondary | ICD-10-CM

## 2019-09-11 DIAGNOSIS — J432 Centrilobular emphysema: Secondary | ICD-10-CM | POA: Diagnosis not present

## 2019-09-11 DIAGNOSIS — F332 Major depressive disorder, recurrent severe without psychotic features: Secondary | ICD-10-CM

## 2019-09-11 DIAGNOSIS — G894 Chronic pain syndrome: Secondary | ICD-10-CM
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
## 2019-09-13 ENCOUNTER — Emergency Department: Payer: Medicare Other

## 2019-09-13 ENCOUNTER — Observation Stay: Payer: Medicare Other

## 2019-09-13 ENCOUNTER — Other Ambulatory Visit: Payer: Self-pay

## 2019-09-13 ENCOUNTER — Observation Stay
Admission: EM | Admit: 2019-09-13 | Discharge: 2019-09-15 | Disposition: A | Discharge status: Home / Self Care | Payer: Medicare Other | Attending: Internal Medicine | Admitting: Internal Medicine

## 2019-09-13 DIAGNOSIS — Z9114 Patient's other noncompliance with medication regimen: Secondary | ICD-10-CM | POA: Diagnosis not present

## 2019-09-13 DIAGNOSIS — M8580 Other specified disorders of bone density and structure, unspecified site: Secondary | ICD-10-CM | POA: Insufficient documentation

## 2019-09-13 DIAGNOSIS — Z791 Long term (current) use of non-steroidal anti-inflammatories (NSAID): Secondary | ICD-10-CM | POA: Insufficient documentation

## 2019-09-13 DIAGNOSIS — K219 Gastro-esophageal reflux disease without esophagitis: Secondary | ICD-10-CM | POA: Diagnosis not present

## 2019-09-13 DIAGNOSIS — E785 Hyperlipidemia, unspecified: Secondary | ICD-10-CM | POA: Diagnosis not present

## 2019-09-13 DIAGNOSIS — Z7901 Long term (current) use of anticoagulants: Secondary | ICD-10-CM | POA: Insufficient documentation

## 2019-09-13 DIAGNOSIS — Z818 Family history of other mental and behavioral disorders: Secondary | ICD-10-CM | POA: Insufficient documentation

## 2019-09-13 DIAGNOSIS — Z87891 Personal history of nicotine dependence: Secondary | ICD-10-CM | POA: Diagnosis not present

## 2019-09-13 DIAGNOSIS — G9341 Metabolic encephalopathy: Principal | ICD-10-CM | POA: Diagnosis present

## 2019-09-13 DIAGNOSIS — Z79899 Other long term (current) drug therapy: Secondary | ICD-10-CM | POA: Diagnosis not present

## 2019-09-13 DIAGNOSIS — D509 Iron deficiency anemia, unspecified: Secondary | ICD-10-CM | POA: Diagnosis not present

## 2019-09-13 DIAGNOSIS — M25561 Pain in right knee: Secondary | ICD-10-CM | POA: Insufficient documentation

## 2019-09-13 DIAGNOSIS — F419 Anxiety disorder, unspecified: Secondary | ICD-10-CM | POA: Insufficient documentation

## 2019-09-13 DIAGNOSIS — R05 Cough: Secondary | ICD-10-CM | POA: Diagnosis present

## 2019-09-13 DIAGNOSIS — F332 Major depressive disorder, recurrent severe without psychotic features: Secondary | ICD-10-CM | POA: Diagnosis not present

## 2019-09-13 DIAGNOSIS — R404 Transient alteration of awareness: Secondary | ICD-10-CM

## 2019-09-13 DIAGNOSIS — R059 Cough, unspecified: Secondary | ICD-10-CM

## 2019-09-13 DIAGNOSIS — H409 Unspecified glaucoma: Secondary | ICD-10-CM | POA: Insufficient documentation

## 2019-09-13 DIAGNOSIS — J449 Chronic obstructive pulmonary disease, unspecified: Secondary | ICD-10-CM | POA: Diagnosis present

## 2019-09-13 DIAGNOSIS — R4182 Altered mental status, unspecified: Secondary | ICD-10-CM | POA: Diagnosis not present

## 2019-09-13 DIAGNOSIS — Z8249 Family history of ischemic heart disease and other diseases of the circulatory system: Secondary | ICD-10-CM | POA: Insufficient documentation

## 2019-09-13 DIAGNOSIS — W19XXXA Unspecified fall, initial encounter: Secondary | ICD-10-CM | POA: Insufficient documentation

## 2019-09-13 DIAGNOSIS — R443 Hallucinations, unspecified: Secondary | ICD-10-CM | POA: Insufficient documentation

## 2019-09-13 DIAGNOSIS — R0602 Shortness of breath: Secondary | ICD-10-CM | POA: Insufficient documentation

## 2019-09-13 DIAGNOSIS — H3552 Pigmentary retinal dystrophy: Secondary | ICD-10-CM | POA: Insufficient documentation

## 2019-09-13 DIAGNOSIS — I10 Essential (primary) hypertension: Secondary | ICD-10-CM | POA: Diagnosis not present

## 2019-09-13 DIAGNOSIS — Z9049 Acquired absence of other specified parts of digestive tract: Secondary | ICD-10-CM | POA: Insufficient documentation

## 2019-09-13 DIAGNOSIS — M25562 Pain in left knee: Secondary | ICD-10-CM | POA: Insufficient documentation

## 2019-09-13 DIAGNOSIS — Z79891 Long term (current) use of opiate analgesic: Secondary | ICD-10-CM

## 2019-09-13 DIAGNOSIS — Y939 Activity, unspecified: Secondary | ICD-10-CM | POA: Insufficient documentation

## 2019-09-13 DIAGNOSIS — Z20822 Contact with and (suspected) exposure to covid-19: Secondary | ICD-10-CM | POA: Insufficient documentation

## 2019-09-13 DIAGNOSIS — Z881 Allergy status to other antibiotic agents status: Secondary | ICD-10-CM | POA: Diagnosis not present

## 2019-09-13 DIAGNOSIS — G894 Chronic pain syndrome: Secondary | ICD-10-CM | POA: Diagnosis present

## 2019-09-13 DIAGNOSIS — Y92009 Unspecified place in unspecified non-institutional (private) residence as the place of occurrence of the external cause: Secondary | ICD-10-CM | POA: Diagnosis present

## 2019-09-13 DIAGNOSIS — R55 Syncope and collapse: Secondary | ICD-10-CM | POA: Insufficient documentation

## 2019-09-13 DIAGNOSIS — Z7982 Long term (current) use of aspirin: Secondary | ICD-10-CM | POA: Insufficient documentation

## 2019-09-13 LAB — RESPIRATORY PANEL BY RT PCR (FLU A&B, COVID)
Influenza A by PCR: NEGATIVE
Influenza B by PCR: NEGATIVE
SARS Coronavirus 2 by RT PCR: NEGATIVE

## 2019-09-13 LAB — BLOOD GAS, VENOUS
Acid-Base Excess: 3.7 mmol/L — ABNORMAL HIGH (ref 0.0–2.0)
Bicarbonate: 29.7 mmol/L — ABNORMAL HIGH (ref 20.0–28.0)
O2 Saturation: 41.9 %
Patient temperature: 37
pCO2, Ven: 49 mmHg (ref 44.0–60.0)
pH, Ven: 7.39 (ref 7.250–7.430)
pO2, Ven: <31 mmHg — CL (ref 32.0–45.0)

## 2019-09-13 LAB — CK: Total CK: 24 U/L — ABNORMAL LOW (ref 49–397)

## 2019-09-13 LAB — COMPREHENSIVE METABOLIC PANEL
ALT: 23 U/L (ref 0–44)
AST: 27 U/L (ref 15–41)
Albumin: 4.3 g/dL (ref 3.5–5.0)
Alkaline Phosphatase: 44 U/L (ref 38–126)
Anion gap: 8 (ref 5–15)
BUN: 23 mg/dL (ref 8–23)
CO2: 24 mmol/L (ref 22–32)
Calcium: 9.7 mg/dL (ref 8.9–10.3)
Chloride: 107 mmol/L (ref 98–111)
Creatinine, Ser: 1.13 mg/dL (ref 0.61–1.24)
GFR calc Af Amer: >60 mL/min (ref >60)
GFR calc non Af Amer: >60 mL/min (ref >60)
Glucose, Bld: 103 mg/dL — ABNORMAL HIGH (ref 70–99)
Potassium: 4.2 mmol/L (ref 3.5–5.1)
Sodium: 139 mmol/L (ref 135–145)
Total Bilirubin: 0.8 mg/dL (ref 0.3–1.2)
Total Protein: 7.2 g/dL (ref 6.5–8.1)

## 2019-09-13 LAB — CBC
HCT: 42.9 % (ref 39.0–52.0)
Hemoglobin: 13.7 g/dL (ref 13.0–17.0)
MCH: 28.6 pg (ref 26.0–34.0)
MCHC: 31.9 g/dL (ref 30.0–36.0)
MCV: 89.6 fL (ref 80.0–100.0)
Platelets: 222 10*3/uL (ref 150–400)
RBC: 4.79 MIL/uL (ref 4.22–5.81)
RDW: 15 % (ref 11.5–15.5)
WBC: 5.1 10*3/uL (ref 4.0–10.5)
nRBC: 0 % (ref 0.0–0.2)

## 2019-09-13 LAB — VITAMIN B12: Vitamin B-12: 479 pg/mL (ref 180–914)

## 2019-09-13 LAB — LACTIC ACID, PLASMA: Lactic Acid, Venous: 1.1 mmol/L (ref 0.5–1.9)

## 2019-09-13 LAB — PROCALCITONIN: Procalcitonin: 0.15 ng/mL

## 2019-09-13 LAB — ETHANOL: Alcohol, Ethyl (B): <10 mg/dL (ref <10)

## 2019-09-13 LAB — GLUCOSE, CAPILLARY: Glucose-Capillary: 102 mg/dL — ABNORMAL HIGH (ref 70–99)

## 2019-09-13 LAB — AMMONIA: Ammonia: 25 umol/L (ref 9–35)

## 2019-09-13 LAB — TSH: TSH: 2.782 u[IU]/mL (ref 0.350–4.500)

## 2019-09-13 LAB — TROPONIN I (HIGH SENSITIVITY): Troponin I (High Sensitivity): 5 ng/L (ref <18)

## 2019-09-13 MED ORDER — DULOXETINE HCL 30 MG PO CPEP
60.0000 mg | ORAL_CAPSULE | Freq: Every day | ORAL | Status: DC
  Administered 2019-09-13 – 2019-09-15 (×2): 60 mg via ORAL
  Filled 2019-09-13: qty 1
  Filled 2019-09-13 (×2): qty 2

## 2019-09-13 MED ORDER — ALBUTEROL SULFATE (2.5 MG/3ML) 0.083% IN NEBU: 2.5000 mg | INHALATION_SOLUTION | RESPIRATORY_TRACT | Status: DC | PRN

## 2019-09-13 MED ORDER — ACETAMINOPHEN 325 MG PO TABS
650.0000 mg | ORAL_TABLET | Freq: Four times a day (QID) | ORAL | Status: DC | PRN
  Filled 2019-09-13: qty 2

## 2019-09-13 MED ORDER — ONDANSETRON HCL 4 MG/2ML IJ SOLN: 4.0000 mg | Freq: Four times a day (QID) | INTRAMUSCULAR | Status: DC | PRN

## 2019-09-13 MED ORDER — OXYCODONE HCL 5 MG PO TABS
5.0000 mg | ORAL_TABLET | Freq: Three times a day (TID) | ORAL | Status: DC | PRN
  Administered 2019-09-13: 5 mg via ORAL
  Filled 2019-09-13: qty 1

## 2019-09-13 MED ORDER — PANTOPRAZOLE SODIUM 40 MG PO TBEC
40.0000 mg | DELAYED_RELEASE_TABLET | Freq: Every day | ORAL | Status: DC
  Administered 2019-09-13 – 2019-09-15 (×2): 40 mg via ORAL
  Filled 2019-09-13 (×3): qty 1

## 2019-09-13 MED ORDER — OXYCODONE HCL 5 MG PO TABS
5.0000 mg | ORAL_TABLET | Freq: Three times a day (TID) | ORAL | Status: DC
  Administered 2019-09-13 – 2019-09-14 (×3): 5 mg via ORAL
  Filled 2019-09-13 (×4): qty 1

## 2019-09-13 MED ORDER — ASPIRIN EC 81 MG PO TBEC
81.0000 mg | DELAYED_RELEASE_TABLET | Freq: Every day | ORAL | Status: DC
  Administered 2019-09-13 – 2019-09-15 (×2): 81 mg via ORAL
  Filled 2019-09-13 (×3): qty 1

## 2019-09-13 MED ORDER — OXYCODONE HCL 5 MG PO TABS: 5.0000 mg | ORAL_TABLET | Freq: Three times a day (TID) | ORAL | Status: DC

## 2019-09-13 MED ORDER — ONDANSETRON HCL 4 MG PO TABS: 4.0000 mg | ORAL_TABLET | Freq: Four times a day (QID) | ORAL | Status: DC | PRN

## 2019-09-13 MED ORDER — ACETAMINOPHEN 650 MG RE SUPP: 650.0000 mg | Freq: Four times a day (QID) | RECTAL | Status: DC | PRN

## 2019-09-13 MED ORDER — MORPHINE SULFATE (PF) 4 MG/ML IV SOLN
5.0000 mg | Freq: Once | INTRAVENOUS | Status: AC
  Administered 2019-09-13: 5 mg via INTRAVENOUS
  Filled 2019-09-13: qty 2

## 2019-09-13 MED ORDER — ENOXAPARIN SODIUM 40 MG/0.4ML ~~LOC~~ SOLN
40.0000 mg | SUBCUTANEOUS | Status: DC
  Administered 2019-09-13 – 2019-09-14 (×2): 40 mg via SUBCUTANEOUS
  Filled 2019-09-13 (×2): qty 0.4

## 2019-09-13 MED ORDER — TIMOLOL MALEATE 0.5 % OP SOLN
1.0000 [drp] | Freq: Two times a day (BID) | OPHTHALMIC | Status: DC
  Administered 2019-09-13 – 2019-09-15 (×3): 1 [drp] via OPHTHALMIC
  Filled 2019-09-13 (×2): qty 5

## 2019-09-13 MED ORDER — PREDNISOLONE ACETATE 1 % OP SUSP
2.0000 [drp] | Freq: Every day | OPHTHALMIC | Status: DC
  Administered 2019-09-13 – 2019-09-15 (×2): 2 [drp] via OPHTHALMIC
  Filled 2019-09-13: qty 5
  Filled 2019-09-13: qty 1

## 2019-09-13 MED ORDER — QUETIAPINE FUMARATE 25 MG PO TABS
100.0000 mg | ORAL_TABLET | Freq: Every day | ORAL | Status: DC
  Administered 2019-09-13 – 2019-09-14 (×2): 100 mg via ORAL
  Filled 2019-09-13 (×2): qty 4

## 2019-09-13 MED ORDER — FENOFIBRATE 160 MG PO TABS
160.0000 mg | ORAL_TABLET | Freq: Every day | ORAL | Status: DC
  Administered 2019-09-15: 160 mg via ORAL
  Filled 2019-09-13 (×2): qty 1

## 2019-09-13 MED ORDER — UMECLIDINIUM-VILANTEROL 62.5-25 MCG/INH IN AEPB
1.0000 | INHALATION_SPRAY | Freq: Every day | RESPIRATORY_TRACT | Status: DC
  Administered 2019-09-15: 1 via RESPIRATORY_TRACT
  Filled 2019-09-13: qty 14

## 2019-09-13 MED ORDER — TRAZODONE HCL 50 MG PO TABS
50.0000 mg | ORAL_TABLET | Freq: Every day | ORAL | Status: DC
  Administered 2019-09-13 – 2019-09-14 (×2): 50 mg via ORAL
  Filled 2019-09-13 (×2): qty 1

## 2019-09-13 MED ORDER — DICLOFENAC SODIUM 1 % EX GEL
1.0000 "application " | CUTANEOUS | Status: DC | PRN
  Filled 2019-09-13: qty 100

## 2019-09-13 MED ORDER — FERROUS SULFATE 325 (65 FE) MG PO TABS
325.0000 mg | ORAL_TABLET | Freq: Every day | ORAL | Status: DC
  Administered 2019-09-15: 08:00:00 325 mg via ORAL
  Filled 2019-09-13 (×2): qty 1

## 2019-09-13 MED ORDER — ADULT MULTIVITAMIN W/MINERALS CH
1.0000 | ORAL_TABLET | Freq: Every day | ORAL | Status: DC
  Administered 2019-09-13 – 2019-09-15 (×2): 1 via ORAL
  Filled 2019-09-13 (×3): qty 1

## 2019-09-13 MED ORDER — AMLODIPINE BESYLATE 10 MG PO TABS
10.0000 mg | ORAL_TABLET | Freq: Every day | ORAL | Status: DC
  Administered 2019-09-13 – 2019-09-15 (×2): 10 mg via ORAL
  Filled 2019-09-13: qty 2
  Filled 2019-09-13 (×2): qty 1

## 2019-09-13 MED ORDER — DM-GUAIFENESIN ER 30-600 MG PO TB12
1.0000 | ORAL_TABLET | Freq: Two times a day (BID) | ORAL | Status: DC
  Administered 2019-09-13 – 2019-09-15 (×4): 1 via ORAL
  Filled 2019-09-13 (×5): qty 1

## 2019-09-13 NOTE — Plan of Care (Signed)
Continue with plan of care.  

## 2019-09-13 NOTE — ED Provider Notes (Signed)
[facility] Emergency Department Provider Note  ____________________________________________   First MD Initiated Contact with Patient 09/13/19 [phone]     (approximate)  I have reviewed the triage vital signs and the nursing notes.   HISTORY  Chief Complaint No chief complaint on file.  Level 5 caveat: The patient's history may be limited by acute illness including unspecified altered mental status.  HPI Aaron Gibbs is a 85 y.o. male with extensive chronic medical history as listed below which notably includes chronic pain syndromes with a chronic pain management doctor (Dr. Dossie Arbour), COPD, depression.  He presents tonight  reportedly after having a syncopal episode at home.  He says he does not know what happened, he just passed out.  He has been feeling ill recently with a worsening cough for several days.  He is reporting pain in both of his knees and his arms and then all over his body but he suffers from chronic pain syndrome and it is unclear if these are new or acute.  Notably he was having hallucinations when he was in the lobby and reporting that he saw dogs running around.  He said that he was recently tested for COVID-19 and was negative.  He denies fever and chest pain.  He has no palpitations at this time.  Nothing in particular makes his symptoms better or worse.  He reports that he used to have a problem with alcohol but he only drinks intermittently now.        Past Medical History:  Diagnosis Date  . Anxiety   . Cancer ([place])    skin  . COPD (chronic obstructive pulmonary disease) ([place])   . Glaucoma   . Osteoporosis    osteopenia    Patient Active Problem List   Diagnosis Date Noted  . COPD (chronic obstructive pulmonary disease) ([place])   . Acute postoperative pain 06/10/2019  . Medial meniscal tear, sequela (Left) 06/10/2019  . Patellar tendinosis (Right) 06/10/2019  . Lateral meniscal tear, sequela (Left) 03/03/2019  .  Palpitation 02/13/2019  . Preop testing 11/14/2018  . Noncompliance with medication treatment due to overuse of medication 10/23/2018  . Neurogenic pain 08/20/2018  . Atherosclerotic peripheral vascular disease ([place]) 07/24/2018  . Tricompartment osteoarthritis of knee (Left) 07/24/2018  . Osteoarthritis of knee (Bilateral) 07/24/2018  . Osteoarthritis of patellofemoral joint (Right) 07/24/2018  . History of suicide attempt (06/12/18) 07/24/2018  . Long term current use of opiate analgesic 07/10/2018  . Pharmacologic therapy 07/10/2018  . Disorder of skeletal system 07/10/2018  . Problems influencing health status 07/10/2018  . Suicide attempt ([place]) (05/13/18) 06/26/2018  . Syncope and collapse 06/04/2018  . 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 Major depressive disorder, recurrent episode, severe ([place]) 06/04/2018  . Blindness 05/10/2018  . Suicidal ideation 05/10/2018  . Chronic pain syndrome 05/01/2018  . DISH (diffuse idiopathic skeletal hyperostosis) 05/01/2018  . Osteoarthritis of multiple joints 05/01/2018  . Chronic knee pain (Primary Area of Pain) (Bilateral) (L>R) 05/01/2018  . Retinitis pigmentosa of both eyes 05/01/2018  . Glaucoma of both eyes 05/01/2018  . Essential hypertension 05/01/2018  . Chronic low back pain (Bilateral) w/o sciatica 05/01/2018  . GERD (gastroesophageal reflux disease) 05/01/2018  . 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 Therapeutic opioid-induced constipation (OIC) 05/01/2018  . Centrilobular emphysema ([place]) 05/01/2018    Past Surgical History:  Procedure Laterality Date  . CHOLECYSTECTOMY    . TRANSURETHRAL RESECTION OF PROSTATE N/A 2008    Prior to Admission medications  Medication Sig Start Date End Date Taking? Authorizing Provider  albuterol (VENTOLIN HFA) 108 (90 Base) MCG/ACT inhaler Inhale 1-2 puffs into the lungs every 6 (six) hours as needed for wheezing or shortness of breath. 05/27/19  Yes Karamalegos, Devonne Doughty, DO  amLODipine (NORVASC) 10 MG tablet Take 10 mg by mouth daily.   Yes [provider]  aspirin EC 81 MG tablet Take 81 mg by mouth daily.   Yes [provider]  bacitracin ophthalmic ointment 1 application as needed. Apply to affected eye 08/01/19  Yes [provider]  diclofenac sodium (VOLTAREN) 1 % GEL Apply 1 application topically as needed. 03/28/19  Yes [provider]  DULoxetine (CYMBALTA) 60 MG capsule Take 1 capsule (60 mg total) by mouth daily. 06/17/19  Yes Karamalegos, Devonne Doughty, DO  fenofibrate (TRICOR) 145 MG tablet Take 145 mg by mouth daily.   Yes [provider]  ferrous sulfate 325 (65 FE) MG tablet Take 325 mg by mouth daily with breakfast.   Yes [provider]  meloxicam (MOBIC) 15 MG tablet Take 1 tablet (15 mg total) by mouth daily. 04/07/19 10/04/19 Yes Milinda Pointer, MD  Multiple Vitamin (MULTIVITAMIN WITH MINERALS) TABS tablet Take 1 tablet by mouth daily.   Yes [provider]  Oxycodone HCl 10 MG TABS Take 1 tablet (10 mg total) by mouth 3 (three) times daily as needed. Must last 30 days. 08/25/19 09/24/19 Yes Milinda Pointer, MD  pantoprazole (PROTONIX) 40 MG tablet Take 40 mg by mouth daily.    Yes [provider]  prednisoLONE acetate (PRED FORTE) 1 % ophthalmic suspension Place 2 drops into the left eye daily. 02/18/19  Yes [name], MD  QUEtiapine (SEROQUEL) 100 MG tablet TAKE 1 TABLET BY MOUTH AT BEDTIME 08/28/19  Yes Karamalegos, Devonne Doughty, DO  timolol (TIMOPTIC) 0.5 % ophthalmic solution 1 drop 2 (two) times daily. Apply one drop to eye(s) 03/12/19  Yes [provider]  traZODone (DESYREL) 50 MG tablet TAKE 1 TABLET BY MOUTH AT BEDTIME 08/28/19  Yes Karamalegos, Devonne Doughty, DO  umeclidinium-vilanterol (ANORO ELLIPTA) 62.5-25 MCG/INH AEPB Inhale 1 puff into the lungs daily.   Yes [provider]  buPROPion (WELLBUTRIN SR) 150 MG 12 hr tablet Take 150 mg by  mouth 2 (two) times daily. 08/27/19   [provider]  Oxycodone HCl 10 MG TABS Take 1 tablet (10 mg total) by mouth 3 (three) times daily as needed. Must last 30 days. 07/26/19 08/25/19  Milinda Pointer, MD  Oxycodone HCl 10 MG TABS Take 1 tablet (10 mg total) by mouth 3 (three) times daily as needed. Must last 30 days. 09/24/19 10/24/19  Milinda Pointer, MD  pregabalin (LYRICA) 150 MG capsule Take 1 capsule (150 mg total) by mouth 3 (three) times daily. Must last 30 days Patient not taking: Reported on 09/05/2019 04/07/19 10/04/19  Milinda Pointer, MD    Allergies Azithromycin  Family History  Problem Relation Age of Onset  . Depression Mother   . Heart disease Father     Social History Social History   Tobacco Use  . Smoking status: Former Smoker    Years: 15.00  . Smokeless tobacco: Current User    Types: Chew  . Tobacco comment: stopped 15 years ago  Substance Use Topics  . Alcohol use: Yes    Comment: past  . Drug use: Never    Review of Systems Level 5 caveat: The patient's history may be limited by acute illness including unspecified  altered mental status.    ____________________________________________   PHYSICAL EXAM:  VITAL SIGNS: ED Triage Vitals  Enc Vitals Group     BP 09/13/19 0127 121/75     Pulse Rate 09/13/19 0127 81     Resp 09/13/19 0127 17     Temp 09/13/19 0127 98.9 F (37.2 C)     Temp src --      SpO2 09/13/19 0127 97 %     Weight 09/13/19 0128 108.9 kg (240 lb)     Height 09/13/19 0128 1.803 m (5\' 11" )     Head Circumference --      Peak Flow --      Pain Score 09/13/19 0127 5     Pain Loc --      Pain Edu? --      Excl. in [place]? --     Constitutional: Alert and oriented to self and location at this time but was hallucinating in the lobby.  Chronically ill-appearing. Eyes: Conjunctivae are normal.  Head: Atraumatic.  Status post prior surgery to his head due to skin cancer with removal of most of his left ear. Nose: No  congestion/rhinnorhea. Mouth/Throat: Patient is wearing a mask. Neck: No stridor.  No meningeal signs.   Cardiovascular: Normal rate, regular rhythm. Good peripheral circulation. Grossly normal heart sounds. Respiratory: Normal respiratory effort.  No retractions.  Frequent cough. Gastrointestinal: Soft and nontender. No distention.  Musculoskeletal: No lower extremity tenderness nor edema. No gross deformities of extremities. Neurologic:  Normal speech and language. No gross focal neurologic deficits are appreciated.  Skin:  Skin is warm, dry and intact.  Old surgical scars on the head and left ear as described above. Psychiatric: Mood and affect are normal. Speech and behavior are normal at this time although he was seeing hallucinations earlier.  ____________________________________________   LABS (all labs ordered are listed, but only abnormal results are displayed)  Labs Reviewed  COMPREHENSIVE METABOLIC PANEL - Abnormal; Notable for the following components:      Result Value   Glucose, Bld 103 (*)    All other components within normal limits  GLUCOSE, CAPILLARY - Abnormal; Notable for the following components:   Glucose-Capillary 102 (*)    All other components within normal limits  CK - Abnormal; Notable for the following components:   Total CK 24 (*)    All other components within normal limits  BLOOD GAS, VENOUS - Abnormal; Notable for the following components:   pO2, Ven <31.0 (*)    Bicarbonate 29.7 (*)    Acid-Base Excess 3.7 (*)    All other components within normal limits  RESPIRATORY PANEL BY RT PCR (FLU A&B, COVID)  CULTURE, BLOOD (ROUTINE X 2)  CULTURE, BLOOD (ROUTINE X 2)  CBC  ETHANOL  AMMONIA  LACTIC ACID, PLASMA  PROCALCITONIN  URINALYSIS, COMPLETE (UACMP) WITH MICROSCOPIC  URINE DRUG SCREEN, QUALITATIVE ([facility] ONLY)  CBG MONITORING, ED  TROPONIN I (HIGH SENSITIVITY)   ____________________________________________  EKG  ED ECG REPORT I, Hinda Kehr, the attending physician, personally viewed and interpreted this ECG.  Date: 09/13/2019 EKG Time: 1:43 AM Rate: 81 Rhythm: normal sinus rhythm QRS Axis: normal Intervals: normal ST/T Wave abnormalities: normal Narrative Interpretation: no evidence of acute ischemia  ____________________________________________  RADIOLOGY I, Hinda Kehr, personally viewed and evaluated these images (plain radiographs) as part of my medical decision making, as well as reviewing the written report by the radiologist.  ED MD interpretation: No acute abnormalities identified on knee x-rays.  No acute abnormalities identified on chest x-ray.  No acute abnormalities identified on CT head without contrast.  Official radiology report(s): CT Head Wo Contrast  Result Date: 09/13/2019 CLINICAL DATA:  Altered mental status EXAM: CT HEAD WITHOUT CONTRAST TECHNIQUE: Contiguous axial images were obtained from the base of the skull through the vertex without intravenous contrast. COMPARISON:  03/29/2019 FINDINGS: Brain: No evidence of acute infarction, hemorrhage, hydrocephalus, extra-axial collection or mass lesion/mass effect. Vascular: No hyperdense vessel or unexpected calcification. Skull: No osseous abnormality. Sinuses/Orbits: Visualized paranasal sinuses are clear. Visualized mastoid sinuses are clear. Visualized orbits demonstrate no focal abnormality. Other: None IMPRESSION: 1. No acute intracranial pathology. 2. Chronic microvascular disease and cerebral atrophy. Electronically Signed   By: Kathreen Devoid   On: 09/13/2019 05:39   DG Chest Port 1 View  Result Date: 09/13/2019 CLINICAL DATA:  Syncopal episode. EXAM: PORTABLE CHEST 1 VIEW COMPARISON:  03/30/2019 FINDINGS: There is mild lingular scarring. There is minimal right middle lobe scarring. There is no focal consolidation. There is no pleural effusion or pneumothorax. The heart and mediastinal contours are unremarkable. There is no acute osseous  abnormality. IMPRESSION: No acute cardiopulmonary disease. Electronically Signed   By: Kathreen Devoid   On: 09/13/2019 05:37   DG Knee Complete 4 Views Left  Result Date: 09/13/2019 CLINICAL DATA:  Status post fall, syncopal episode EXAM: LEFT KNEE - COMPLETE 4+ VIEW COMPARISON:  None. FINDINGS: No acute fracture or dislocation. No aggressive osseous lesion. Mild lateral femorotibial compartment joint space narrowing. No joint effusion. No soft tissue abnormality. Peripheral vascular atherosclerotic disease. IMPRESSION: No acute osseous injury of the left knee. Electronically Signed   By: Kathreen Devoid   On: 09/13/2019 04:13    ____________________________________________   PROCEDURES   Procedure(s) performed (including Critical Care):  Procedures   ____________________________________________   INITIAL IMPRESSION / MDM / [place] / ED COURSE  As part of my medical decision making, I reviewed the following data within the [place] notes reviewed and incorporated, Labs reviewed , EKG interpreted , Old chart reviewed, Radiograph reviewed , Discussed with admitting physician , Notes from prior ED visits and [place] Controlled Substance Database   Differential diagnosis includes, but is not limited to, acute infection such as pneumonia or UTI, COVID-19, narcotics or alcohol withdrawal, narcotics or alcohol intoxication, other unspecified acute encephalopathy, hyperammonemia, metabolic or electrolyte abnormality.  The patient's vital signs are generally reassuring.  He is not tachycardic nor hypoxemic.  Broad laboratory evaluation is pending.  He says he had a syncopal episode and was down for an unknown period of time so I added on a CK which was 24.  CBC is normal, initial troponin is normal at 5, comprehensive metabolic panel is all within normal limits.  I added on lactic acid, pro calcitonin, venous blood gas, blood cultures, ammonia level, urinalysis and  urine drug screen.      Clinical Course as of Sep 13 734  Sat Sep 13, 2019  0541 Ammonia: 25 [CF]  0542 DG Chest [place] 1 View [CF]  [phone] CT Head Wo Contrast [CF]  [id number] Lactic Acid, Venous: 1.1 [CF]  [phone] Patient continually wringing out on the call bell for pain medicine.  Given that he has been here for nearly 5 hours and takes regular doses of narcotics as an outpatient, I believe he is likely having some narcotics withdrawal at this point although this does not explain the initial presentation.  I am giving morphine 5  mg IV.   [CF]  0555 Reassuring procalcitonin that does not suggest systemic infection.  Procalcitonin: 0.15 [CF]  0555 Generally reassuring venous blood gas.  I doubt the accuracy of the PO2, but the PCO2 is 49 which is not indicative of severe hypercapnia and altered mental status.  Blood gas, venous(!!) [CF]  0621 Alcohol, Ethyl (B): <10 [CF]  0714 SARS Coronavirus 2 by RT PCR: NEGATIVE [CF]  0714 Although his work-up has been generally reassuring, given the patient's age, comorbidities, and unexplained syncope as well as his acute encephalopathy (seeing dogs in the waiting room), I have consulted the hospitalist team for admission for syncopal work-up.   [CF]  0732 Discussed case by phone with Dr. Blaine Hamper with the hospitalist service and he will admit for further syncope evaluation.   [CF]    Clinical Course User Index [CF] Hinda Kehr, MD     ____________________________________________  FINAL CLINICAL IMPRESSION(S) / ED DIAGNOSES  Final diagnoses:  Syncope and collapse  Transient alteration of awareness  Cough  Chronic pain syndrome     MEDICATIONS GIVEN DURING THIS VISIT:  Medications  morphine 4 MG/ML injection 5 mg (5 mg Intravenous Given 09/13/19 0555)     ED Discharge Orders    None      *Please note:  Okoye Hague was evaluated in Emergency Department on 09/13/2019 for the symptoms described in the history of present illness. He was  evaluated in the context of the global COVID-19 pandemic, which necessitated consideration that the patient might be at risk for infection with the SARS-CoV-2 virus that causes COVID-19. Institutional protocols and algorithms that pertain to the evaluation of patients at risk for COVID-19 are in a state of rapid change based on information released by regulatory bodies including the CDC and federal and state organizations. These policies and algorithms were followed during the patient's care in the ED.  Some ED evaluations and interventions may be delayed as a result of limited staffing during the pandemic.*  Note:  This document was prepared using Dragon voice recognition software and may include unintentional dictation errors.   Hinda Kehr, MD 09/13/19 [id number]

## 2019-09-13 NOTE — ED Triage Notes (Signed)
Patient c/o syncopal episode today at home. Patient reports he was unconscious on floor for an unknown amount of time.   Patient is hallucinating, patient seeing dogs in lobby.

## 2019-09-13 NOTE — ED Notes (Signed)
Pt arrives via [facility] for a fall. Pt is c/o left knee pain. Pt fell early today has just decided to come in. Per EMS, pt's VS are WDL. Pt is in NAD.

## 2019-09-13 NOTE — Progress Notes (Signed)
Patient attempting to get out of bed without the use of a call light. Patient educated of how to call for out of bed needs. Patient advised it would be best to lay in bed or sit in the bedside chair with chair alarm however refused and states he wants to sit at the edge of bed. Patient allowed with bed exit alarms on. Patient does also have yellow non skid socks on will continue to monitor. Safety checks completed and call light placed within reach.

## 2019-09-13 NOTE — H&P (Signed)
History and Physical    Aaron Gibbs [id number] DOB: 06/26/1934 DOA: 09/13/2019  Referring MD/NP/PA:   PCP: Olin Hauser, DO   Patient coming from:  The patient is coming from home.  At baseline, pt is independent for most of ADL.        Chief Complaint: confusion and hallucination  HPI: Aaron Gibbs is a 85 y.o. male with medical history significant of hypertension, hyperlipidemia, COPD, GERD, depression, glaucoma, iron deficiency anemia chronic pain syndrome, who presents with confusion and hallucination.  Per EDP, pt had syncope, but I have obtained different medical history with ED physician.  Per her daughter (I called her daughter by phone), pt has been confused intermittently recently, he has hallucination, seeing dogs in the lobby.  He fell several times, last fall was on 09/05/2019. He has left knee pain. He does not have unilateral numbness or tingling his extremities.  No facial droop or slurred speech. Per her daughter, patient is confused today, he pushed medical alert in his neck, and EMS came.  I have repeatedly asked her daughter if patient passed out, she states that patient absolutely did not pass out.  Patient has dry cough, some shortness breath, no chest pain.  No fever or chills.  Patient does not have nausea, vomiting, diarrhea, abdominal pain or symptoms of UTI. Per his daughter, patient is taking multiple medications for pain and depression.  His medication list was recently reviewed by pharmacist, who recommended to discontinue Wellbutrin, but the patient is still taking his medication.  ED Course: pt was found to have WBC 5.1, troponin 5, lactic acid 1.1, pending UA, ammonia level 25, negative Covid PCR, alcohol level less than 10, CK 24, creatinine 1.13, BUN 23, temperature normal, blood pressure 116/67, heart rate 75, RR 24, oxygen saturation 95% on room air.  Chest x-ray negative.  X-ray of left knee is negative for fracture.  CT of head is  negative for acute intracranial abnormalities. Pt is placed on MedSurg bed of observation.   Review of Systems: Could not be reviewed accurately due to altered mental status.  Allergy:  Allergies  Allergen Reactions  . Azithromycin Shortness Of Breath    Past Medical History:  Diagnosis Date  . Anxiety   . Cancer ([place])    skin  . COPD (chronic obstructive pulmonary disease) ([place])   . Glaucoma   . Osteoporosis    osteopenia    Past Surgical History:  Procedure Laterality Date  . CHOLECYSTECTOMY    . TRANSURETHRAL RESECTION OF PROSTATE N/A 2008    Social History:  reports that he has quit smoking. He quit after 15.00 years of use. His smokeless tobacco use includes chew. He reports current alcohol use. He reports that he does not use drugs.  Family History:  Family History  Problem Relation Age of Onset  . Depression Mother   . Heart disease Father      Prior to Admission medications   Medication Sig Start Date End Date Taking? Authorizing Provider  albuterol (VENTOLIN HFA) 108 (90 Base) MCG/ACT inhaler Inhale 1-2 puffs into the lungs every 6 (six) hours as needed for wheezing or shortness of breath. 05/27/19  Yes Karamalegos, Devonne Doughty, DO  amLODipine (NORVASC) 10 MG tablet Take 10 mg by mouth daily.   Yes [provider]  aspirin EC 81 MG tablet Take 81 mg by mouth daily.   Yes [provider]  bacitracin ophthalmic ointment 1 application as needed. Apply to affected eye 08/01/19  Yes [provider]  diclofenac sodium (VOLTAREN) 1 % GEL Apply 1 application topically as needed. 03/28/19  Yes [provider]  DULoxetine (CYMBALTA) 60 MG capsule Take 1 capsule (60 mg total) by mouth daily. 06/17/19  Yes Karamalegos, Devonne Doughty, DO  fenofibrate (TRICOR) 145 MG tablet Take 145 mg by mouth daily.   Yes [provider]  ferrous sulfate 325 (65 FE) MG tablet Take 325 mg by mouth daily with breakfast.   Yes [provider]   meloxicam (MOBIC) 15 MG tablet Take 1 tablet (15 mg total) by mouth daily. 04/07/19 10/04/19 Yes Milinda Pointer, MD  Multiple Vitamin (MULTIVITAMIN WITH MINERALS) TABS tablet Take 1 tablet by mouth daily.   Yes [provider]  Oxycodone HCl 10 MG TABS Take 1 tablet (10 mg total) by mouth 3 (three) times daily as needed. Must last 30 days. 08/25/19 09/24/19 Yes Milinda Pointer, MD  pantoprazole (PROTONIX) 40 MG tablet Take 40 mg by mouth daily.    Yes [provider]  prednisoLONE acetate (PRED FORTE) 1 % ophthalmic suspension Place 2 drops into the left eye daily. 02/18/19  Yes [name], MD  QUEtiapine (SEROQUEL) 100 MG tablet TAKE 1 TABLET BY MOUTH AT BEDTIME 08/28/19  Yes Karamalegos, Devonne Doughty, DO  timolol (TIMOPTIC) 0.5 % ophthalmic solution 1 drop 2 (two) times daily. Apply one drop to eye(s) 03/12/19  Yes [provider]  traZODone (DESYREL) 50 MG tablet TAKE 1 TABLET BY MOUTH AT BEDTIME 08/28/19  Yes Karamalegos, Devonne Doughty, DO  umeclidinium-vilanterol (ANORO ELLIPTA) 62.5-25 MCG/INH AEPB Inhale 1 puff into the lungs daily.   Yes [provider]  buPROPion (WELLBUTRIN SR) 150 MG 12 hr tablet Take 150 mg by mouth 2 (two) times daily. 08/27/19   [provider]  Oxycodone HCl 10 MG TABS Take 1 tablet (10 mg total) by mouth 3 (three) times daily as needed. Must last 30 days. 07/26/19 08/25/19  Milinda Pointer, MD  Oxycodone HCl 10 MG TABS Take 1 tablet (10 mg total) by mouth 3 (three) times daily as needed. Must last 30 days. 09/24/19 10/24/19  Milinda Pointer, MD  pregabalin (LYRICA) 150 MG capsule Take 1 capsule (150 mg total) by mouth 3 (three) times daily. Must last 30 days Patient not taking: Reported on 09/05/2019 04/07/19 10/04/19  Milinda Pointer, MD    Physical Exam: Vitals:   09/13/19 0800 09/13/19 0830 09/13/19 0900 09/13/19 0930  BP: 128/80 (!) 143/76 139/75 (!) 135/59  Pulse: 70 78 80 74  Resp: (!) 28 (!) 27 (!) 28 20   Temp:      SpO2: 96% 94% 96% 96%  Weight:      Height:       General: Not in acute distress HEENT:       Eyes: PERRL, EOMI, no scleral icterus.       ENT: No discharge from the ears and nose, no pharynx injection, no tonsillar enlargement.        Neck: No JVD, no bruit, no mass felt. Heme: No neck lymph node enlargement. Cardiac: S1/S2, RRR, No murmurs, No gallops or rubs. Respiratory: No rales, wheezing, rhonchi or rubs. GI: Soft, nondistended, nontender, no organomegaly, BS present. GU: No hematuria Ext: No pitting leg edema bilaterally. 2+DP/PT pulse bilaterally. Musculoskeletal: No joint deformities, No joint redness or warmth, no limitation of ROM in spin. Skin: No rashes.  Neuro: mildly confused, but is still orientated x3, cranial nerves II-XII grossly intact, moves all extremities. Muscle strength 5/5  in all extremities, sensation to light touch intact. Psych: Patient is not psychotic, no suicidal or hemocidal ideation.  Labs on Admission: I have personally reviewed following labs and imaging studies  CBC: Recent Labs  Lab 09/13/19 0138  WBC 5.1  HGB 13.7  HCT 42.9  MCV 89.6  PLT [id number]   Basic Metabolic Panel: Recent Labs  Lab 09/13/19 0138  NA 139  K 4.2  CL 107  CO2 24  GLUCOSE 103*  BUN 23  CREATININE 1.13  CALCIUM 9.7   GFR: Estimated Creatinine Clearance: 63.2 mL/min (by C-G formula based on SCr of 1.13 mg/dL). Liver Function Tests: Recent Labs  Lab 09/13/19 0138  AST 27  ALT 23  ALKPHOS 44  BILITOT 0.8  PROT 7.2  ALBUMIN 4.3   No results for input(s): LIPASE, AMYLASE in the last 168 hours. Recent Labs  Lab 09/13/19 0444  AMMONIA 25   Coagulation Profile: No results for input(s): INR, PROTIME in the last 168 hours. Cardiac Enzymes: Recent Labs  Lab 09/13/19 0138  CKTOTAL 24*   BNP (last 3 results) No results for input(s): PROBNP in the last 8760 hours. HbA1C: No results for input(s): HGBA1C in the last 72 hours. CBG: Recent  Labs  Lab 09/13/19 0136  GLUCAP 102*   Lipid Profile: No results for input(s): CHOL, HDL, LDLCALC, TRIG, CHOLHDL, LDLDIRECT in the last 72 hours. Thyroid Function Tests: No results for input(s): TSH, T4TOTAL, FREET4, T3FREE, THYROIDAB in the last 72 hours. Anemia Panel: No results for input(s): VITAMINB12, FOLATE, FERRITIN, TIBC, IRON, RETICCTPCT in the last 72 hours. Urine analysis:    Component Value Date/Time   COLORURINE YELLOW (A) 03/28/2019 2226   APPEARANCEUR HAZY (A) 03/28/2019 2226   LABSPEC 1.025 03/28/2019 2226   PHURINE 5.0 03/28/2019 2226   GLUCOSEU NEGATIVE 03/28/2019 2226   HGBUR NEGATIVE 03/28/2019 2226   BILIRUBINUR NEGATIVE 03/28/2019 2226   KETONESUR NEGATIVE 03/28/2019 2226   PROTEINUR 30 (A) 03/28/2019 2226   NITRITE NEGATIVE 03/28/2019 2226   LEUKOCYTESUR MODERATE (A) 03/28/2019 2226   Sepsis Labs: @LABRCNTIP (procalcitonin:4,lacticidven:4) ) Recent Results (from the past 240 hour(s))  Respiratory Panel by RT PCR (Flu A&B, Covid) - Nasopharyngeal Swab     Status: None   Collection Time: 09/13/19  6:03 AM   Specimen: Nasopharyngeal Swab  Result Value Ref Range Status   SARS Coronavirus 2 by RT PCR NEGATIVE NEGATIVE Final    Comment: (NOTE) SARS-CoV-2 target nucleic acids are NOT DETECTED. The SARS-CoV-2 RNA is generally detectable in upper respiratoy specimens during the acute phase of infection. The lowest concentration of SARS-CoV-2 viral copies this assay can detect is 131 copies/mL. A negative result does not preclude SARS-Cov-2 infection and should not be used as the sole basis for treatment or other patient management decisions. A negative result may occur with  improper specimen collection/handling, submission of specimen other than nasopharyngeal swab, presence of viral mutation(s) within the areas targeted by this assay, and inadequate number of viral copies (<131 copies/mL). A negative result must be combined with clinical observations,  patient history, and epidemiological information. The expected result is Negative. Fact Sheet for Patients:  PinkCheek.be Fact Sheet for Healthcare Providers:  GravelBags.it This test is not yet ap proved or cleared by the [place] FDA and  has been authorized for detection and/or diagnosis of SARS-CoV-2 by FDA under an Emergency Use Authorization (EUA). This EUA will remain  in effect (meaning this test can be used) for the duration of the COVID-19 declaration under  Section 564(b)(1) of the Act, 21 U.S.C. section 360bbb-3(b)(1), unless the authorization is terminated or revoked sooner.    Influenza A by PCR NEGATIVE NEGATIVE Final   Influenza B by PCR NEGATIVE NEGATIVE Final    Comment: (NOTE) The Xpert Xpress SARS-CoV-2/FLU/RSV assay is intended as an aid in  the diagnosis of influenza from Nasopharyngeal swab specimens and  should not be used as a sole basis for treatment. Nasal washings and  aspirates are unacceptable for Xpert Xpress SARS-CoV-2/FLU/RSV  testing. Fact Sheet for Patients: PinkCheek.be Fact Sheet for Healthcare Providers: GravelBags.it This test is not yet approved or cleared by the [place] FDA and  has been authorized for detection and/or diagnosis of SARS-CoV-2 by  FDA under an Emergency Use Authorization (EUA). This EUA will remain  in effect (meaning this test can be used) for the duration of the  Covid-19 declaration under Section 564(b)(1) of the Act, 21  U.S.C. section 360bbb-3(b)(1), unless the authorization is  terminated or revoked. Performed at [facility], [address] [place], [place] [address]      Radiological Exams on Admission: CT Head Wo Contrast  Result Date: 09/13/2019 CLINICAL DATA:  Altered mental status EXAM: CT HEAD WITHOUT CONTRAST TECHNIQUE: Contiguous axial images were obtained from the base of  the skull through the vertex without intravenous contrast. COMPARISON:  03/29/2019 FINDINGS: Brain: No evidence of acute infarction, hemorrhage, hydrocephalus, extra-axial collection or mass lesion/mass effect. Vascular: No hyperdense vessel or unexpected calcification. Skull: No osseous abnormality. Sinuses/Orbits: Visualized paranasal sinuses are clear. Visualized mastoid sinuses are clear. Visualized orbits demonstrate no focal abnormality. Other: None IMPRESSION: 1. No acute intracranial pathology. 2. Chronic microvascular disease and cerebral atrophy. Electronically Signed   By: Kathreen Devoid   On: 09/13/2019 05:39   DG Chest Port 1 View  Result Date: 09/13/2019 CLINICAL DATA:  Syncopal episode. EXAM: PORTABLE CHEST 1 VIEW COMPARISON:  03/30/2019 FINDINGS: There is mild lingular scarring. There is minimal right middle lobe scarring. There is no focal consolidation. There is no pleural effusion or pneumothorax. The heart and mediastinal contours are unremarkable. There is no acute osseous abnormality. IMPRESSION: No acute cardiopulmonary disease. Electronically Signed   By: Kathreen Devoid   On: 09/13/2019 05:37   DG Knee Complete 4 Views Left  Result Date: 09/13/2019 CLINICAL DATA:  Status post fall, syncopal episode EXAM: LEFT KNEE - COMPLETE 4+ VIEW COMPARISON:  None. FINDINGS: No acute fracture or dislocation. No aggressive osseous lesion. Mild lateral femorotibial compartment joint space narrowing. No joint effusion. No soft tissue abnormality. Peripheral vascular atherosclerotic disease. IMPRESSION: No acute osseous injury of the left knee. Electronically Signed   By: Kathreen Devoid   On: 09/13/2019 04:13     EKG: Independently reviewed.  Sinus rhythm, QTC 464, LAD, nonspecific T wave change  Assessment/Plan Principal Problem:   Acute metabolic encephalopathy Active Problems:   Chronic pain syndrome   Essential hypertension   GERD (gastroesophageal reflux disease)   Major depressive  disorder, recurrent episode, severe ([place])   Long term current use of opiate analgesic   COPD (chronic obstructive pulmonary disease) ([facility])   HLD (hyperlipidemia)   Iron deficiency anemia   Fall  Acute metabolic encephalopathy and hallucination: Etiology is not clear.  CT of head is negative.  Differential diagnosis include delirium, dementia with behavior change, stroke.  Side effects of Wellbutrin  -Placed on MedSurg bed for observation -Frequent neuro check -Check vitamin B12 level, TSH, RPR -MRI of brain  Chronic pain syndrome: -Continue  home oxycodone 5 mg 3 times daily -Hold Wellbutrin  HTN:  -Continue home medications: Amlodipine -hydralazine prn  GERD (gastroesophageal reflux disease): -Protonix  Major depressive disorder, recurrent episode, severe ([place]): -Hold Wellbutrin -Continue Seroquel, Cymbalta  COPD (chronic obstructive pulmonary disease) (HCC): stable -Bronchodilators  HLD (hyperlipidemia) -Fenofibrate  Iron deficiency anemia: Hemoglobin stable at 13.7 -Continue iron supplement  Fall: CT head negative.  X-ray of her left knee negative -PT/OT        DVT ppx: SQ Lovenox Code Status: DNR per her daughter who is POA Family Communication:  Yes, patient's daughter by phone Disposition Plan:  Anticipate discharge back to previous home environment Consults called:  none Admission status: Med-surg bed for obs    Date of Service 09/13/2019    [facility]   If 7PM-7AM, please contact night-coverage [email or web address] 09/13/2019, 9:55 AM

## 2019-09-13 NOTE — Progress Notes (Signed)
Patient continues to be non compliant with mobility needs and will not call nursing staff. We have now moved him to a room closer to the nursing station to better monitor patient.

## 2019-09-14 DIAGNOSIS — G9341 Metabolic encephalopathy: Secondary | ICD-10-CM | POA: Diagnosis not present

## 2019-09-14 DIAGNOSIS — F333 Major depressive disorder, recurrent, severe with psychotic symptoms: Secondary | ICD-10-CM | POA: Diagnosis not present

## 2019-09-14 LAB — RPR: RPR Ser Ql: NONREACTIVE

## 2019-09-14 LAB — CBC
HCT: 39.4 % (ref 39.0–52.0)
Hemoglobin: 12.7 g/dL — ABNORMAL LOW (ref 13.0–17.0)
MCH: 28.6 pg (ref 26.0–34.0)
MCHC: 32.2 g/dL (ref 30.0–36.0)
MCV: 88.7 fL (ref 80.0–100.0)
Platelets: 200 10*3/uL (ref 150–400)
RBC: 4.44 MIL/uL (ref 4.22–5.81)
RDW: 14.9 % (ref 11.5–15.5)
WBC: 4.2 10*3/uL (ref 4.0–10.5)
nRBC: 0 % (ref 0.0–0.2)

## 2019-09-14 LAB — BASIC METABOLIC PANEL
Anion gap: 4 — ABNORMAL LOW (ref 5–15)
BUN: 21 mg/dL (ref 8–23)
CO2: 25 mmol/L (ref 22–32)
Calcium: 9.2 mg/dL (ref 8.9–10.3)
Chloride: 111 mmol/L (ref 98–111)
Creatinine, Ser: 1.05 mg/dL (ref 0.61–1.24)
GFR calc Af Amer: >60 mL/min (ref >60)
GFR calc non Af Amer: >60 mL/min (ref >60)
Glucose, Bld: 106 mg/dL — ABNORMAL HIGH (ref 70–99)
Potassium: 4 mmol/L (ref 3.5–5.1)
Sodium: 140 mmol/L (ref 135–145)

## 2019-09-14 MED ORDER — OXYCODONE HCL 5 MG PO TABS: 10.0000 mg | ORAL_TABLET | Freq: Three times a day (TID) | ORAL | Status: DC | PRN

## 2019-09-14 MED ORDER — OXYCODONE-ACETAMINOPHEN 5-325 MG PO TABS
2.0000 | ORAL_TABLET | Freq: Four times a day (QID) | ORAL | Status: DC | PRN
  Administered 2019-09-14: 2 via ORAL
  Filled 2019-09-14: qty 2

## 2019-09-14 MED ORDER — OXYCODONE HCL 5 MG PO TABS
10.0000 mg | ORAL_TABLET | Freq: Three times a day (TID) | ORAL | Status: DC
  Administered 2019-09-14 – 2019-09-15 (×3): 10 mg via ORAL
  Filled 2019-09-14 (×3): qty 2

## 2019-09-14 NOTE — Evaluation (Signed)
Occupational Therapy Evaluation Patient Details Name: Aaron Gibbs MRN: [id number] DOB: 11/26/1934 Today's Date: 09/14/2019    History of Present Illness Aaron Gibbs is a 85 y.o. male with medical history significant of hypertension, hyperlipidemia, COPD, GERD, depression, glaucoma, iron deficiency anemia chronic pain syndrome, who presents with confusion and hallucination.   Clinical Impression   Aaron Gibbs was seen for OT evaluation this date. Prior to hospital admission, pt was SUP+ [facility] for mobility and used [name] aides for IADLs and supervision 2/2 low vision. Pt lives alone and endorses hx of 4-5 falls in last 3 months. Pt presents to acute OT demonstrating impaired ADL performance and functional mobility 2/2 functional endurance deficits, visual impairments, and decreased safety awareness. Pt currently requires SETUP for self-feeding seated EOB and SUP + RW for toileting at standard commode. Pt would benefit from skilled OT to address noted impairments and functional limitations (see below for any additional details) in order to maximize safety and independence while minimizing falls risk and caregiver burden. Upon hospital discharge, recommend [place] with 24HR Supervision to maximize pt safety and return to functional independence during meaningful occupations of daily life.     Follow Up Recommendations  Home health OT;Supervision/Assistance - 24 hour    Equipment Recommendations       Recommendations for Other Services       Precautions / Restrictions Precautions Precautions: Fall Restrictions Weight Bearing Restrictions: No      Mobility Bed Mobility               General bed mobility comments: Not tested, pt received at toilet and left c PT  Transfers Overall transfer level: Needs assistance Equipment used: Rolling walker (2 wheeled) Transfers: Sit to/from Stand Sit to Stand: (SBA for RW management 2/2 low vision)         General transfer comment: Pt  requires verbal cues for direction increasing to MOD A for RW management in narrow space    Balance Overall balance assessment: Needs assistance Sitting-balance support: No upper extremity supported;Feet supported Sitting balance-Leahy Scale: Normal     Standing balance support: No upper extremity supported;During functional activity Standing balance-Leahy Scale: Good                             ADL either performed or assessed with clinical judgement   ADL Overall ADL's : Needs assistance/impaired                                       General ADL Comments: SBA + RW toileting standing at regular commode including clothing management and hand sanitizer (NT provided sanitizer, pt unable to locate on wall). SETUP for self-feeding seated EOB - pt requires verbal/visual prompts t/o 2/2 vision impairment.      Vision Baseline Vision/History: Legally blind       Perception     Praxis      Pertinent Vitals/Pain Pain Assessment: Faces Faces Pain Scale: Hurts a little bit Pain Descriptors / Indicators: Aching     Hand Dominance Right   Extremity/Trunk Assessment Upper Extremity Assessment Upper Extremity Assessment: Overall WFL for tasks assessed   Lower Extremity Assessment Lower Extremity Assessment: Overall WFL for tasks assessed       Communication Communication Communication: No difficulties   Cognition Arousal/Alertness: Awake/alert Behavior During Therapy: WFL for tasks assessed/performed Overall Cognitive Status: No family/caregiver present  to determine baseline cognitive functioning                                 General Comments: Pt A&O x4, aware of recent confusion   General Comments       Exercises Exercises: Other exercises Other Exercises Other Exercises: Pt educated re: OT role, visual scanning strategies, RW technique, energy conservation, and falls prevention Other Exercises: Toileting, hand hygiene,  self-feeding, sitting/standing balance/tolerance, face washing, hand washing   Shoulder Instructions      Home Living Family/patient expects to be discharged to:: Private residence Living Arrangements: Alone Available Help at Discharge: Home health;Available PRN/intermittently(Aids available - pt did not report how often stating "they are useless, they just babysit me") Type of Home: House       Home Layout: One level               Home Equipment: Walker - 2 wheels;Cane - single point          Prior Functioning/Environment Level of Independence: Needs assistance  Gait / Transfers Assistance Needed: Pt reports using a SPC for functional mobility. Pt reports 4-5 falls in last 3 months ADL's / Homemaking Assistance Needed: Pt reports aids assist with meals and supervise mobility, per [facility] dgtr has stated she thinks situation is unsafe            OT Problem List: Decreased activity tolerance;Impaired vision/perception;Decreased safety awareness;Decreased knowledge of use of DME or AE      OT Treatment/Interventions: Self-care/ADL training;Therapeutic exercise;Neuromuscular education;Energy conservation;DME and/or AE instruction;Cognitive remediation/compensation;Visual/perceptual remediation/compensation;Patient/family education;Balance training    OT Goals(Current goals can be found in the care plan section) Acute Rehab OT Goals Patient Stated Goal: to go home OT Goal Formulation: With patient Time For Goal Achievement: 09/28/19 Potential to Achieve Goals: Fair ADL Goals Pt Will Perform Lower Body Dressing: sit to/from stand;with min guard assist(c LRAD & AE PRN) Pt Will Transfer to Toilet: with modified independence;ambulating;regular height toilet(c LRAD PRN) Pt Will Perform Toileting - Clothing Manipulation and hygiene: with modified independence;sit to/from stand(c LRAD PRN) Additional ADL Goal #1: Pt/caregiver will independently verbalize plan to implement x3 low  vision strategies  OT Frequency: Min 1X/week   Barriers to D/C: Inaccessible home environment;Decreased caregiver support          Co-evaluation              AM-PAC OT "6 Clicks" Daily Activity     Outcome Measure Help from another person eating meals?: A Little Help from another person taking care of personal grooming?: A Little Help from another person toileting, which includes using toliet, bedpan, or urinal?: A Little Help from another person bathing (including washing, rinsing, drying)?: A Little Help from another person to put on and taking off regular upper body clothing?: A Little Help from another person to put on and taking off regular lower body clothing?: A Little 6 Click Score: 18   End of Session Equipment Utilized During Treatment: Rolling walker  Activity Tolerance: Patient tolerated treatment well Patient left: in bed;Other (comment)(PT in room at end of session)  OT Visit Diagnosis: Unsteadiness on feet (R26.81);Repeated falls (R29.6);Low vision, both eyes (H54.2)                Time: [id number] OT Time Calculation (min): 19 min Charges:  OT General Charges $OT Visit: 1 Visit OT Evaluation $OT Eval Low Complexity: 1 Low OT Treatments $Self Care/Home  Management : 8-22 mins  Dessie Coma, M.S. OTR/L  09/14/19, 3:05 PM

## 2019-09-14 NOTE — Progress Notes (Signed)
Offered patient medications on three different occassions all of which he refused.

## 2019-09-14 NOTE — Progress Notes (Signed)
Physical Therapy Evaluation Patient Details Name: Aaron Gibbs MRN: [id number] DOB: 1935-05-16 Today's Date: 09/14/2019   History of Present Illness  Aaron Gibbs is a 85 y.o. male with medical history significant of hypertension, hyperlipidemia, COPD, GERD, depression, glaucoma, iron deficiency anemia chronic pain syndrome, who presents with confusion and hallucination.  Clinical Impression  Patient is sitting on edge of bed, OT is finishing working with patient. He agrees to PT evaluation. He is min A for sit to stand transfers with RW and vc for safety and visual cues due to decreased vision. He ambulates 60 feet with min assist due to diminished vision. He wants to try to use the commode on the way back to his bed. He needs min assist for guidance to reach his bed with RW. He needs MI for sit to supine bed mobility. He has BLE strength deficits 3/5 hips and knees. Patient will benefit from skilled PT to improve mobility and strength.     Follow Up Recommendations Home health PT    Equipment Recommendations  None recommended by PT    Recommendations for Other Services       Precautions / Restrictions Precautions Precautions: Fall Restrictions Weight Bearing Restrictions: No      Mobility  Bed Mobility Overal bed mobility: Modified Independent             General bed mobility comments: (sit to supine with MI)  Transfers Overall transfer level: Needs assistance Equipment used: Rolling walker (2 wheeled) Transfers: Sit to/from Stand Sit to Stand: Min guard         General transfer comment: (Patient needs visual cues due to low vision)  Ambulation/Gait Ambulation/Gait assistance: Min assist Gait Distance (Feet): 60 Feet Assistive device: Rolling walker (2 wheeled) Gait Pattern/deviations: Step-to pattern     General Gait Details: (decreased gait speed)  Stairs            Wheelchair Mobility    Modified Rankin (Stroke Patients Only)        Balance Overall balance assessment: Needs assistance Sitting-balance support: No upper extremity supported;Feet supported Sitting balance-Leahy Scale: Normal     Standing balance support: No upper extremity supported Standing balance-Leahy Scale: Good                               Pertinent Vitals/Pain Pain Assessment: Faces Faces Pain Scale: Hurts a little bit Pain Descriptors / Indicators: Aching    Home Living Family/patient expects to be discharged to:: Private residence Living Arrangements: Alone Available Help at Discharge: (HHA) Type of Home: House       Home Layout: One level Home Equipment: Environmental consultant - 2 wheels;Cane - single point      Prior Function Level of Independence: Needs assistance   Gait / Transfers Assistance Needed: Pt reports using a SPC for functional mobility  ADL's / Homemaking Assistance Needed: Pt reports aids assist with meals and supervise mobility, per [facility] dgtr has stated she thinks situation is unsafe        Hand Dominance   Dominant Hand: Right    Extremity/Trunk Assessment   Upper Extremity Assessment Upper Extremity Assessment: Defer to OT evaluation    Lower Extremity Assessment Lower Extremity Assessment: Generalized weakness(3/5 BLE hip and knee )       Communication   Communication: No difficulties  Cognition Arousal/Alertness: Awake/alert Behavior During Therapy: WFL for tasks assessed/performed Overall Cognitive Status: No family/caregiver present to determine baseline cognitive functioning  General Comments: Pt A&O x4, aware of recent confusion      General Comments      Exercises Other Exercises Other Exercises: Pt educated re: OT role, visual scanning strategies, RW technique, energy conservation, and falls prevention Other Exercises: Toileting, hand hygiene, self-feeding, sitting/standing balance/tolerance, face washing, hand washing    Assessment/Plan    PT Assessment Patient needs continued PT services  PT Problem List Decreased strength;Decreased activity tolerance;Decreased mobility       PT Treatment Interventions Gait training;Therapeutic activities;Therapeutic exercise;Balance training    PT Goals (Current goals can be found in the Care Plan section)  Acute Rehab PT Goals Patient Stated Goal: (to go home) PT Goal Formulation: Patient unable to participate in goal setting Time For Goal Achievement: 09/28/19 Potential to Achieve Goals: Good    Frequency Min 2X/week   Barriers to discharge        Co-evaluation               AM-PAC PT "6 Clicks" Mobility  Outcome Measure Help needed turning from your back to your side while in a flat bed without using bedrails?: A Little Help needed moving from lying on your back to sitting on the side of a flat bed without using bedrails?: A Little Help needed moving to and from a bed to a chair (including a wheelchair)?: A Little Help needed standing up from a chair using your arms (e.g., wheelchair or bedside chair)?: A Little Help needed to walk in hospital room?: A Little Help needed climbing 3-5 steps with a railing? : A Little 6 Click Score: 18    End of Session Equipment Utilized During Treatment: Gait belt Activity Tolerance: Patient limited by fatigue;Patient limited by lethargy Patient left: in bed;with bed alarm set Nurse Communication: Mobility status PT Visit Diagnosis: Unsteadiness on feet (R26.81);Muscle weakness (generalized) (M62.81);Difficulty in walking, not elsewhere classified (R26.2)    Time: [id number] PT Time Calculation (min) (ACUTE ONLY): 14 min   Charges:   PT Evaluation $PT Eval Low Complexity: 1 Low            [place], Sherryl Barters, PT DPT 09/14/2019, 3:21 PM

## 2019-09-14 NOTE — Progress Notes (Signed)
PROGRESS NOTE    Aaron Gibbs  [id number] DOB: May 12, 1935 DOA: 09/13/2019 PCP: Olin Hauser, DO  Brief Narrative:  HPI: Aaron Gibbs is a 85 y.o. male with medical history significant of hypertension, hyperlipidemia, COPD, GERD, depression, glaucoma, iron deficiency anemia chronic pain syndrome, who presents with confusion and hallucination.  Per EDP, pt had syncope, but I have obtained different medical history with ED physician.  Per her daughter (I called her daughter by phone), pt has been confused intermittently recently, he has hallucination, seeing dogs in the lobby.  He fell several times, last fall was on 09/05/2019. He has left knee pain. He does not have unilateral numbness or tingling his extremities.  No facial droop or slurred speech. Per her daughter, patient is confused today, he pushed medical alert in his neck, and EMS came.  I have repeatedly asked her daughter if patient passed out, she states that patient absolutely did not pass out.  Patient has dry cough, some shortness breath, no chest pain.  No fever or chills.  Patient does not have nausea, vomiting, diarrhea, abdominal pain or symptoms of UTI. Per his daughter, patient is taking multiple medications for pain and depression.  His medication list was recently reviewed by pharmacist, who recommended to discontinue Wellbutrin, but the patient is still taking his medication  3/28: Patient seen and examined.  Daughter at bedside.  Patient very lethargic but does awaken on prompting.  Unable to provide any reliable history.  Per the patient's daughter the patient is taking a number of medications prescribed by numerous providers.  The daughter is working with patient's primary care doctor and an outpatient pharmacist in order to titrate his medication regimen.  Again per the daughter the patient does have a history of medication and alcohol abuse though the alcohol abuse is not a current issue.   Assessment &  Plan:   Principal Problem:   Acute metabolic encephalopathy Active Problems:   Chronic pain syndrome   Essential hypertension   GERD (gastroesophageal reflux disease)   Major depressive disorder, recurrent episode, severe (HCC)   Long term current use of opiate analgesic   COPD (chronic obstructive pulmonary disease) (HCC)   HLD (hyperlipidemia)   Iron deficiency anemia   Fall Acute metabolic encephalopathy and hallucination:  Etiology is not clear.   CT of head is negative.   MRI brain negative Suspect progressive dementia with behavioral changes versus medication effect secondary to polypharmacy Substance abuse remains on differential to rule out lower after speaking with the daughter Potential adverse effects from Wellbutrin Plan: Continue frequent neuro checks Psychiatry consulted, recommendations appreciated Per the daughter the patient lives alone and has home aides with him.  Daughter states that his home situation is unsafe.  She states the patient is nearly blind and is unable to care for himself at home even with the assistance of his aids. Patient may benefit from a facility placement post discharge.  Consultations placed with case management and physical therapy to assess for skillable needs  Chronic pain syndrome: -Continue home oxycodone 5 mg 3 times daily -Hold Wellbutrin  HTN:  -Continue home medications: Amlodipine -hydralazine prn  GERD (gastroesophageal reflux disease): -Protonix  Major depressive disorder, recurrent episode, severe ([place]): -Hold Wellbutrin -Continue Seroquel, Cymbalta  COPD (chronic obstructive pulmonary disease) ([place]): stable -Bronchodilators  HLD (hyperlipidemia) -Fenofibrate  Iron deficiency anemia: Hemoglobin stable at 13.7 -Continue iron supplement  Fall: CT head negative.  X-ray of her left knee negative -PT/OT    DVT prophylaxis:  Lovenox Code Status: DNR Family Communication: Daughter at bedside Disposition  Plan: Unclear at this time.  Patient's home environment may not be a safe disposition plan.  Patient had benefit from skilled nursing facility placement versus long-term care.  Consultation with psychiatry pending.  Recommendations appreciated.  Physical therapy and Occupational Therapy consults placed as well.  Case management is aware of current plan of care   Consultants:   Psychiatry  Procedures:   None  Antimicrobials:   None   Subjective: Seen and examined. Sleeping but easily arousable Unable or unwilling to provide history  Objective: Vitals:   09/13/19 1300 09/13/19 1958 09/13/19 2255 09/14/19 0355  BP:  (!) 125/57 123/70 116/61  Pulse:  75 79 62  Resp:  17 18 16   Temp:  97.8 F (36.6 C) 97.9 F (36.6 C) 97.8 F (36.6 C)  TempSrc:  Oral Oral Oral  SpO2:  95% 97% 96%  Weight: 108 kg     Height: 5\' 11"  (1.803 m)       Intake/Output Summary (Last 24 hours) at 09/14/2019 1337 Last data filed at 09/13/2019 2300 Gross per 24 hour  Intake 368 ml  Output 1 ml  Net 367 ml   Filed Weights   09/13/19 0128 09/13/19 1300  Weight: 108.9 kg 108 kg    Examination:  General exam: Appears calm and comfortable  Respiratory system: Clear to auscultation. Respiratory effort normal. Cardiovascular system: S1 & S2 heard, RRR. No JVD, murmurs, rubs, gallops or clicks. No pedal edema. Gastrointestinal system: Abdomen is nondistended, soft and nontender. No organomegaly or masses felt. Normal bowel sounds heard. Central nervous system: Alert and oriented. No focal neurological deficits. Extremities: Symmetric 5 x 5 power. Skin: No rashes, lesions or ulcers Psychiatry: Mildly confused, oriented x2    Data Reviewed: I have personally reviewed following labs and imaging studies  CBC: Recent Labs  Lab 09/13/19 0138 09/14/19 0518  WBC 5.1 4.2  HGB 13.7 12.7*  HCT 42.9 39.4  MCV 89.6 88.7  PLT 222 [id number]   Basic Metabolic Panel: Recent Labs  Lab 09/13/19 0138  09/14/19 0518  NA 139 140  K 4.2 4.0  CL 107 111  CO2 24 25  GLUCOSE 103* 106*  BUN 23 21  CREATININE 1.13 1.05  CALCIUM 9.7 9.2   GFR: Estimated Creatinine Clearance: 67.8 mL/min (by C-G formula based on SCr of 1.05 mg/dL). Liver Function Tests: Recent Labs  Lab 09/13/19 0138  AST 27  ALT 23  ALKPHOS 44  BILITOT 0.8  PROT 7.2  ALBUMIN 4.3   No results for input(s): LIPASE, AMYLASE in the last 168 hours. Recent Labs  Lab 09/13/19 0444  AMMONIA 25   Coagulation Profile: No results for input(s): INR, PROTIME in the last 168 hours. Cardiac Enzymes: Recent Labs  Lab 09/13/19 0138  CKTOTAL 24*   BNP (last 3 results) No results for input(s): PROBNP in the last 8760 hours. HbA1C: No results for input(s): HGBA1C in the last 72 hours. CBG: Recent Labs  Lab 09/13/19 0136  GLUCAP 102*   Lipid Profile: No results for input(s): CHOL, HDL, LDLCALC, TRIG, CHOLHDL, LDLDIRECT in the last 72 hours. Thyroid Function Tests: Recent Labs    09/13/19 1243  TSH 2.782   Anemia Panel: Recent Labs    09/13/19 1243  VITAMINB12 479   Sepsis Labs: Recent Labs  Lab 09/13/19 0444  PROCALCITON 0.15  LATICACIDVEN 1.1    Recent Results (from the past 240 hour(s))  Blood Culture (routine x  2)     Status: None (Preliminary result)   Collection Time: 09/13/19  4:45 AM   Specimen: BLOOD  Result Value Ref Range Status   Specimen Description BLOOD LEFT ASSIST CONTROL  Final   Special Requests   Final    BOTTLES DRAWN AEROBIC AND ANAEROBIC Blood Culture adequate volume   Culture   Final    NO GROWTH 1 DAY Performed at [facility], [address], [place], [place] [address]    Report Status PENDING  Incomplete  Respiratory Panel by RT PCR (Flu A&B, Covid) - Nasopharyngeal Swab     Status: None   Collection Time: 09/13/19  6:03 AM   Specimen: Nasopharyngeal Swab  Result Value Ref Range Status   SARS Coronavirus 2 by RT PCR NEGATIVE NEGATIVE Final    Comment:  (NOTE) SARS-CoV-2 target nucleic acids are NOT DETECTED. The SARS-CoV-2 RNA is generally detectable in upper respiratoy specimens during the acute phase of infection. The lowest concentration of SARS-CoV-2 viral copies this assay can detect is 131 copies/mL. A negative result does not preclude SARS-Cov-2 infection and should not be used as the sole basis for treatment or other patient management decisions. A negative result may occur with  improper specimen collection/handling, submission of specimen other than nasopharyngeal swab, presence of viral mutation(s) within the areas targeted by this assay, and inadequate number of viral copies (<131 copies/mL). A negative result must be combined with clinical observations, patient history, and epidemiological information. The expected result is Negative. Fact Sheet for Patients:  PinkCheek.be Fact Sheet for Healthcare Providers:  GravelBags.it This test is not yet ap proved or cleared by the [place] FDA and  has been authorized for detection and/or diagnosis of SARS-CoV-2 by FDA under an Emergency Use Authorization (EUA). This EUA will remain  in effect (meaning this test can be used) for the duration of the COVID-19 declaration under Section 564(b)(1) of the Act, 21 U.S.C. section 360bbb-3(b)(1), unless the authorization is terminated or revoked sooner.    Influenza A by PCR NEGATIVE NEGATIVE Final   Influenza B by PCR NEGATIVE NEGATIVE Final    Comment: (NOTE) The Xpert Xpress SARS-CoV-2/FLU/RSV assay is intended as an aid in  the diagnosis of influenza from Nasopharyngeal swab specimens and  should not be used as a sole basis for treatment. Nasal washings and  aspirates are unacceptable for Xpert Xpress SARS-CoV-2/FLU/RSV  testing. Fact Sheet for Patients: PinkCheek.be Fact Sheet for Healthcare  Providers: GravelBags.it This test is not yet approved or cleared by the [place] FDA and  has been authorized for detection and/or diagnosis of SARS-CoV-2 by  FDA under an Emergency Use Authorization (EUA). This EUA will remain  in effect (meaning this test can be used) for the duration of the  Covid-19 declaration under Section 564(b)(1) of the Act, 21  U.S.C. section 360bbb-3(b)(1), unless the authorization is  terminated or revoked. Performed at [facility], [address], [place], [place] [address]          Radiology Studies: CT Head Wo Contrast  Result Date: 09/13/2019 CLINICAL DATA:  Altered mental status EXAM: CT HEAD WITHOUT CONTRAST TECHNIQUE: Contiguous axial images were obtained from the base of the skull through the vertex without intravenous contrast. COMPARISON:  03/29/2019 FINDINGS: Brain: No evidence of acute infarction, hemorrhage, hydrocephalus, extra-axial collection or mass lesion/mass effect. Vascular: No hyperdense vessel or unexpected calcification. Skull: No osseous abnormality. Sinuses/Orbits: Visualized paranasal sinuses are clear. Visualized mastoid sinuses are clear. Visualized orbits demonstrate  no focal abnormality. Other: None IMPRESSION: 1. No acute intracranial pathology. 2. Chronic microvascular disease and cerebral atrophy. Electronically Signed   By: Kathreen Devoid   On: 09/13/2019 05:39   MR BRAIN WO CONTRAST  Result Date: 09/13/2019 CLINICAL DATA:  Encephalopathy. Confusion and hallucinations. EXAM: MRI HEAD WITHOUT CONTRAST TECHNIQUE: Multiplanar, multiecho pulse sequences of the brain and surrounding structures were obtained without intravenous contrast. COMPARISON:  Head CT 09/13/2019 FINDINGS: Brain: There is no evidence of acute infarct, intracranial hemorrhage, mass, midline shift, or extra-axial fluid collection. Patchy T2 hyperintensities in the cerebral white matter bilaterally are nonspecific but  compatible with moderate chronic small vessel ischemic disease. There is mild cerebral atrophy. Vascular: Major intracranial vascular flow voids are preserved. Skull and upper cervical spine: Unremarkable bone marrow signal. Sinuses/Orbits: Bilateral cataract extraction. Paranasal sinuses and mastoid air cells are clear. Other: 1.5 cm T2 hypointense nodule in the superficial aspect of the right parotid gland. IMPRESSION: 1. No acute intracranial abnormality. 2. Moderate chronic small vessel ischemic disease. 3. 1.5 cm right parotid mass, nonspecific though may reflect a primary parotid neoplasm. Electronically Signed   By: Logan Bores M.D.   On: 09/13/2019 19:35   DG Chest Port 1 View  Result Date: 09/13/2019 CLINICAL DATA:  Syncopal episode. EXAM: PORTABLE CHEST 1 VIEW COMPARISON:  03/30/2019 FINDINGS: There is mild lingular scarring. There is minimal right middle lobe scarring. There is no focal consolidation. There is no pleural effusion or pneumothorax. The heart and mediastinal contours are unremarkable. There is no acute osseous abnormality. IMPRESSION: No acute cardiopulmonary disease. Electronically Signed   By: Kathreen Devoid   On: 09/13/2019 05:37   DG Knee Complete 4 Views Left  Result Date: 09/13/2019 CLINICAL DATA:  Status post fall, syncopal episode EXAM: LEFT KNEE - COMPLETE 4+ VIEW COMPARISON:  None. FINDINGS: No acute fracture or dislocation. No aggressive osseous lesion. Mild lateral femorotibial compartment joint space narrowing. No joint effusion. No soft tissue abnormality. Peripheral vascular atherosclerotic disease. IMPRESSION: No acute osseous injury of the left knee. Electronically Signed   By: Kathreen Devoid   On: 09/13/2019 04:13        Scheduled Meds: . amLODipine  10 mg Oral Daily  . aspirin EC  81 mg Oral Daily  . dextromethorphan-guaiFENesin  1 tablet Oral BID  . DULoxetine  60 mg Oral Daily  . enoxaparin (LOVENOX) injection  40 mg Subcutaneous Q24H  . fenofibrate   160 mg Oral Daily  . ferrous sulfate  325 mg Oral Q breakfast  . multivitamin with minerals  1 tablet Oral Daily  . oxyCODONE  5 mg Oral TID  . pantoprazole  40 mg Oral Daily  . prednisoLONE acetate  2 drop Left Eye Daily  . QUEtiapine  100 mg Oral QHS  . timolol  1 drop Both Eyes BID  . traZODone  50 mg Oral QHS  . umeclidinium-vilanterol  1 puff Inhalation Daily   Continuous Infusions:   LOS: 0 days    Time spent: 35 minutes    Sidney Ace, MD [facility] Pager [phone]  If 7PM-7AM, please contact night-coverage 09/14/2019, 1:37 PM

## 2019-09-14 NOTE — Plan of Care (Signed)
Continuing with plan of care. 

## 2019-09-14 NOTE — Consult Note (Addendum)
[facility] Face-to-Face Psychiatry Consult   Reason for Consult:  Hallucinations, agitation Referring Physician:  Dr Priscella Mann Patient Identification: Aaron Gibbs MRN:  [id number] Principal Diagnosis: Acute metabolic encephalopathy Diagnosis:  Principal Problem:   Acute metabolic encephalopathy Active Problems:   Major depressive disorder, recurrent episode, severe (HCC)   Chronic pain syndrome   Essential hypertension   GERD (gastroesophageal reflux disease)   Long term current use of opiate analgesic   COPD (chronic obstructive pulmonary disease) (HCC)   HLD (hyperlipidemia)   Iron deficiency anemia   Fall   Total Time spent with patient: 1 hour  Subjective:   Aaron Gibbs is a 85 y.o. male patient admitted with acute metabolic encephalopathy, consult for hallucinations and agitation.  Patient seen and evaluated in person by this provider.  He reports an increase in depression for the past month related to his prior hospitalization for medical concerns.  During this time he also started experiencing visual hallucinations of seeing his dog and cat at times or thinking someone is at the door and does not.  These happen sporadically and consistently throughout the day.  No auditory hallucinations.  Sleep is poor even with Seroquel along with appetite.  Reports his depression is a 7 out of 10 with no suicidal ideations or past history of suicide attempts.  No homicidal ideations or substance abuse.  Consult placed for hallucinations and agitations.  Recommendations and treatment plan below  HPI per MD:  Aaron Gibbs is a 85 y.o. male with medical history significant of hypertension, hyperlipidemia, COPD, GERD, depression, glaucoma, iron deficiency anemia chronic pain syndrome, who presents with confusion and hallucination.  Per EDP, pt had syncope, but I have obtained different medical history with ED physician.  Per her daughter (I called her daughter by phone), pt has been confused  intermittently recently, he has hallucination, seeing dogs in the lobby.  He fell several times, last fall was on 09/05/2019. He has left knee pain. He does not have unilateral numbness or tingling his extremities.  No facial droop or slurred speech. Per her daughter, patient is confused today, he pushed medical alert in his neck, and EMS came.  I have repeatedly asked her daughter if patient passed out, she states that patient absolutely did not pass out.  Patient has dry cough, some shortness breath, no chest pain.  No fever or chills.  Patient does not have nausea, vomiting, diarrhea, abdominal pain or symptoms of UTI. Per his daughter, patient is taking multiple medications for pain and depression.  His medication list was recently reviewed by pharmacist, who recommended to discontinue Wellbutrin, but the patient is still taking his medication.  Past Psychiatric History: depression, anxiety  Risk to Self:  none Risk to Others:  none Prior Inpatient Therapy:  denies Prior Outpatient Therapy:  [facility]  Past Medical History:  Past Medical History:  Diagnosis Date  . Anxiety   . Cancer ([place])    skin  . COPD (chronic obstructive pulmonary disease) ([place])   . Glaucoma   . Osteoporosis    osteopenia    Past Surgical History:  Procedure Laterality Date  . CHOLECYSTECTOMY    . TRANSURETHRAL RESECTION OF PROSTATE N/A 2008   Family History:  Family History  Problem Relation Age of Onset  . Depression Mother   . Heart disease Father    Family Psychiatric  History: mother with depression Social History:  Social History   Substance and Sexual Activity  Alcohol Use Yes   Comment: past  Social History   Substance and Sexual Activity  Drug Use Never    Social History   Socioeconomic History  . Marital status: Divorced    Spouse name: Not on file  . Number of children: Not on file  . Years of education: Not on file  . Highest education level: Not on file  Occupational History  .  Occupation: retired  Tobacco Use  . Smoking status: Former Smoker    Years: 15.00  . Smokeless tobacco: Current User    Types: Chew  . Tobacco comment: stopped 15 years ago  Substance and Sexual Activity  . Alcohol use: Yes    Comment: past  . Drug use: Never  . Sexual activity: Not Currently  Other Topics Concern  . Not on file  Social History Narrative  . Not on file   Social Determinants of Health   Financial Resource Strain: Low Risk   . Difficulty of Paying Living Expenses: Not very hard  Food Insecurity: No Food Insecurity  . Worried About Charity fundraiser in the Last Year: Never true  . Ran Out of Food in the Last Year: Never true  Transportation Needs: No Transportation Needs  . Lack of Transportation (Medical): No  . Lack of Transportation (Non-Medical): No  Physical Activity: Inactive  . Days of Exercise per Week: 0 days  . Minutes of Exercise per Session: 0 min  Stress: Stress Concern Present  . Feeling of Stress : Rather much  Social Connections: Unknown  . Frequency of Communication with Friends and Family: Not on file  . Frequency of Social Gatherings with Friends and Family: Not on file  . Attends Religious Services: Not on file  . Active Member of Clubs or Organizations: Not on file  . Attends Archivist Meetings: Not on file  . Marital Status: Divorced   Additional Social History:    Allergies:   Allergies  Allergen Reactions  . Azithromycin Shortness Of Breath    Labs:  Results for orders placed or performed during the hospital encounter of 09/13/19 (from the past 48 hour(s))  Glucose, capillary     Status: Abnormal   Collection Time: 09/13/19  1:36 AM  Result Value Ref Range   Glucose-Capillary 102 (H) 70 - 99 mg/dL    Comment: Glucose reference range applies only to samples taken after fasting for at least 8 hours.  CBC     Status: None   Collection Time: 09/13/19  1:38 AM  Result Value Ref Range   WBC 5.1 4.0 - 10.5 K/uL    RBC 4.79 4.22 - 5.81 MIL/uL   Hemoglobin 13.7 13.0 - 17.0 g/dL   HCT 42.9 39.0 - 52.0 %   MCV 89.6 80.0 - 100.0 fL   MCH 28.6 26.0 - 34.0 pg   MCHC 31.9 30.0 - 36.0 g/dL   RDW 15.0 11.5 - 15.5 %   Platelets 222 150 - 400 K/uL   nRBC 0.0 0.0 - 0.2 %    Comment: Performed at [facility], [address] [place], [place] [address]  Comprehensive metabolic panel     Status: Abnormal   Collection Time: 09/13/19  1:38 AM  Result Value Ref Range   Sodium 139 135 - 145 mmol/L   Potassium 4.2 3.5 - 5.1 mmol/L   Chloride 107 98 - 111 mmol/L   CO2 24 22 - 32 mmol/L   Glucose, Bld 103 (H) 70 - 99 mg/dL    Comment: Glucose reference range  applies only to samples taken after fasting for at least 8 hours.   BUN 23 8 - 23 mg/dL   Creatinine, Ser 1.13 0.61 - 1.24 mg/dL   Calcium 9.7 8.9 - 10.3 mg/dL   Total Protein 7.2 6.5 - 8.1 g/dL   Albumin 4.3 3.5 - 5.0 g/dL   AST 27 15 - 41 U/L   ALT 23 0 - 44 U/L   Alkaline Phosphatase 44 38 - 126 U/L   Total Bilirubin 0.8 0.3 - 1.2 mg/dL   GFR calc non Af Amer >60 >60 mL/min   GFR calc Af Amer >60 >60 mL/min   Anion gap 8 5 - 15    Comment: Performed at [facility], [place], [place] [address]  Troponin I (High Sensitivity)     Status: None   Collection Time: 09/13/19  1:38 AM  Result Value Ref Range   Troponin I (High Sensitivity) 5 <18 ng/L    Comment: (NOTE) Elevated high sensitivity troponin I (hsTnI) values and significant  changes across serial measurements may suggest ACS but many other  chronic and acute conditions are known to elevate hsTnI results.  Refer to the "Links" section for chest pain algorithms and additional  guidance. Performed at [facility], [address] [place], [place] [address]   CK     Status: Abnormal   Collection Time: 09/13/19  1:38 AM  Result Value Ref Range   Total CK 24 (L) 49 - 397 U/L    Comment: Performed at [facility], [address]  [place], [place] [address]  Ethanol     Status: None   Collection Time: 09/13/19  4:44 AM  Result Value Ref Range   Alcohol, Ethyl (B) <10 <10 mg/dL    Comment: (NOTE) Lowest detectable limit for serum alcohol is 10 mg/dL. For medical purposes only. Performed at [facility], [address] [place], [place] [address]   Ammonia     Status: None   Collection Time: 09/13/19  4:44 AM  Result Value Ref Range   Ammonia 25 9 - 35 umol/L    Comment: Performed at [facility], [address] [place], [place] [address]  Lactic acid, plasma     Status: None   Collection Time: 09/13/19  4:44 AM  Result Value Ref Range   Lactic Acid, Venous 1.1 0.5 - 1.9 mmol/L    Comment: Performed at [facility], [address] [place], [place] [address]  Blood gas, venous     Status: Abnormal   Collection Time: 09/13/19  4:44 AM  Result Value Ref Range   pH, Ven 7.39 7.250 - 7.430   pCO2, Ven 49 44.0 - 60.0 mmHg   pO2, Ven <31.0 (LL) 32.0 - 45.0 mmHg    Comment: CRITICAL RESULT CALLED TO, READ BACK BY AND VERIFIED WITH: RN CHARLES WOODS AT [phone] ON 09/13/2019 [facility]    Bicarbonate 29.7 (H) 20.0 - 28.0 mmol/L   Acid-Base Excess 3.7 (H) 0.0 - 2.0 mmol/L   O2 Saturation 41.9 %   Patient temperature 37.0    Collection site VENOUS    Sample type VENOUS     Comment: Performed at [facility], [address] [place], [place] [address]  Procalcitonin     Status: None   Collection Time: 09/13/19  4:44 AM  Result Value Ref Range   Procalcitonin 0.15 ng/mL    Comment:        Interpretation: PCT (  Procalcitonin) <= 0.5 ng/mL: Systemic infection (sepsis) is not likely. Local bacterial infection is possible. (NOTE)       Sepsis PCT Algorithm           Lower Respiratory Tract                                      Infection PCT Algorithm    ----------------------------     ----------------------------         PCT < 0.25 ng/mL                PCT < 0.10 ng/mL         Strongly  encourage             Strongly discourage   discontinuation of antibiotics    initiation of antibiotics    ----------------------------     -----------------------------       PCT 0.25 - 0.50 ng/mL            PCT 0.10 - 0.25 ng/mL               OR       >80% decrease in PCT            Discourage initiation of                                            antibiotics      Encourage discontinuation           of antibiotics    ----------------------------     -----------------------------         PCT >= 0.50 ng/mL              PCT 0.26 - 0.50 ng/mL               AND        <80% decrease in PCT             Encourage initiation of                                             antibiotics       Encourage continuation           of antibiotics    ----------------------------     -----------------------------        PCT >= 0.50 ng/mL                  PCT > 0.50 ng/mL               AND         increase in PCT                  Strongly encourage                                      initiation of antibiotics    Strongly encourage escalation           of antibiotics                                     -----------------------------  PCT <= 0.25 ng/mL                                                 OR                                        > 80% decrease in PCT                                     Discontinue / Do not initiate                                             antibiotics Performed at [facility], [address] [place], [place] [address]   Blood Culture (routine x 2)     Status: None (Preliminary result)   Collection Time: 09/13/19  4:45 AM   Specimen: BLOOD  Result Value Ref Range   Specimen Description BLOOD LEFT ASSIST CONTROL    Special Requests      BOTTLES DRAWN AEROBIC AND ANAEROBIC Blood Culture adequate volume   Culture      NO GROWTH 1 DAY Performed at [facility], [address], [place], [place] [address]     Report Status PENDING   Respiratory Panel by RT PCR (Flu A&B, Covid) - Nasopharyngeal Swab     Status: None   Collection Time: 09/13/19  6:03 AM   Specimen: Nasopharyngeal Swab  Result Value Ref Range   SARS Coronavirus 2 by RT PCR NEGATIVE NEGATIVE    Comment: (NOTE) SARS-CoV-2 target nucleic acids are NOT DETECTED. The SARS-CoV-2 RNA is generally detectable in upper respiratoy specimens during the acute phase of infection. The lowest concentration of SARS-CoV-2 viral copies this assay can detect is 131 copies/mL. A negative result does not preclude SARS-Cov-2 infection and should not be used as the sole basis for treatment or other patient management decisions. A negative result may occur with  improper specimen collection/handling, submission of specimen other than nasopharyngeal swab, presence of viral mutation(s) within the areas targeted by this assay, and inadequate number of viral copies (<131 copies/mL). A negative result must be combined with clinical observations, patient history, and epidemiological information. The expected result is Negative. Fact Sheet for Patients:  PinkCheek.be Fact Sheet for Healthcare Providers:  GravelBags.it This test is not yet ap proved or cleared by the [place] FDA and  has been authorized for detection and/or diagnosis of SARS-CoV-2 by FDA under an Emergency Use Authorization (EUA). This EUA will remain  in effect (meaning this test can be used) for the duration of the COVID-19 declaration under Section 564(b)(1) of the Act, 21 U.S.C. section 360bbb-3(b)(1), unless the authorization is terminated or revoked sooner.    Influenza A by PCR NEGATIVE NEGATIVE   Influenza B by PCR NEGATIVE NEGATIVE    Comment: (NOTE) The Xpert Xpress SARS-CoV-2/FLU/RSV assay is intended as an aid in  the diagnosis of influenza from Nasopharyngeal swab specimens and  should not be used as a sole  basis for treatment. Nasal washings and  aspirates are unacceptable for  Xpert Xpress SARS-CoV-2/FLU/RSV  testing. Fact Sheet for Patients: PinkCheek.be Fact Sheet for Healthcare Providers: GravelBags.it This test is not yet approved or cleared by the [place] FDA and  has been authorized for detection and/or diagnosis of SARS-CoV-2 by  FDA under an Emergency Use Authorization (EUA). This EUA will remain  in effect (meaning this test can be used) for the duration of the  Covid-19 declaration under Section 564(b)(1) of the Act, 21  U.S.C. section 360bbb-3(b)(1), unless the authorization is  terminated or revoked. Performed at [facility], [address] [place], [place] [address]   Vitamin B12     Status: None   Collection Time: 09/13/19 12:43 PM  Result Value Ref Range   Vitamin B-12 479 180 - 914 pg/mL    Comment: (NOTE) This assay is not validated for testing neonatal or myeloproliferative syndrome specimens for Vitamin B12 levels. Performed at [facility], [place] [address], [place], [place] [address]   TSH     Status: None   Collection Time: 09/13/19 12:43 PM  Result Value Ref Range   TSH 2.782 0.350 - 4.500 uIU/mL    Comment: Performed by a 3rd Generation assay with a functional sensitivity of <=0.01 uIU/mL. Performed at [facility], [address] [place], [place] [address]   RPR     Status: None   Collection Time: 09/13/19 12:43 PM  Result Value Ref Range   RPR Ser Ql NON REACTIVE NON REACTIVE    Comment: Performed at [facility] [address], [place], [place] [id number]  Basic metabolic panel     Status: Abnormal   Collection Time: 09/14/19  5:18 AM  Result Value Ref Range   Sodium 140 135 - 145 mmol/L   Potassium 4.0 3.5 - 5.1 mmol/L   Chloride 111 98 - 111 mmol/L   CO2 25 22 - 32 mmol/L   Glucose, Bld 106 (H) 70 - 99 mg/dL    Comment: Glucose reference range  applies only to samples taken after fasting for at least 8 hours.   BUN 21 8 - 23 mg/dL   Creatinine, Ser 1.05 0.61 - 1.24 mg/dL   Calcium 9.2 8.9 - 10.3 mg/dL   GFR calc non Af Amer >60 >60 mL/min   GFR calc Af Amer >60 >60 mL/min   Anion gap 4 (L) 5 - 15    Comment: Performed at [facility], [address] [place], [place] [address]  CBC     Status: Abnormal   Collection Time: 09/14/19  5:18 AM  Result Value Ref Range   WBC 4.2 4.0 - 10.5 K/uL   RBC 4.44 4.22 - 5.81 MIL/uL   Hemoglobin 12.7 (L) 13.0 - 17.0 g/dL   HCT 39.4 39.0 - 52.0 %   MCV 88.7 80.0 - 100.0 fL   MCH 28.6 26.0 - 34.0 pg   MCHC 32.2 30.0 - 36.0 g/dL   RDW 14.9 11.5 - 15.5 %   Platelets 200 150 - 400 K/uL   nRBC 0.0 0.0 - 0.2 %    Comment: Performed at [facility], [address] [place],  [address]    Current Facility-Administered Medications  Medication Dose Route Frequency Provider Last Rate Last Admin  . acetaminophen (TYLENOL) tablet 650 mg  650 mg Oral Q6H PRN Ivor Costa, MD       Or  . acetaminophen (TYLENOL) suppository 650 mg  650 mg Rectal Q6H PRN Ivor Costa, MD      .  albuterol (PROVENTIL) (2.5 MG/3ML) 0.083% nebulizer solution 2.5 mg  2.5 mg Nebulization Q4H PRN Ivor Costa, MD      . amLODipine (NORVASC) tablet 10 mg  10 mg Oral Daily Ivor Costa, MD   10 mg at 09/13/19 0940  . aspirin EC tablet 81 mg  81 mg Oral Daily Ivor Costa, MD   81 mg at 09/13/19 0940  . dextromethorphan-guaiFENesin (MUCINEX DM) 30-600 MG per 12 hr tablet 1 tablet  1 tablet Oral BID Ivor Costa, MD   1 tablet at 09/13/19 2130  . diclofenac Sodium (VOLTAREN) 1 % topical gel 1 application  1 application Topical PRN Ivor Costa, MD      . DULoxetine (CYMBALTA) DR capsule 60 mg  60 mg Oral Daily Ivor Costa, MD   60 mg at 09/13/19 0940  . enoxaparin (LOVENOX) injection 40 mg  40 mg Subcutaneous Q24H Ivor Costa, MD   40 mg at 09/13/19 2132  . fenofibrate tablet 160 mg  160 mg Oral Daily Ivor Costa, MD       . ferrous sulfate tablet 325 mg  325 mg Oral Q breakfast Ivor Costa, MD      . multivitamin with minerals tablet 1 tablet  1 tablet Oral Daily Ivor Costa, MD   1 tablet at 09/13/19 0940  . ondansetron (ZOFRAN) tablet 4 mg  4 mg Oral Q6H PRN Ivor Costa, MD       Or  . ondansetron [facility]) injection 4 mg  4 mg Intravenous Q6H PRN Ivor Costa, MD      . oxyCODONE (Oxy IR/ROXICODONE) immediate release tablet 10 mg  10 mg Oral TID Ralene Muskrat B, MD      . pantoprazole (PROTONIX) EC tablet 40 mg  40 mg Oral Daily Ivor Costa, MD   40 mg at 09/13/19 0940  . prednisoLONE acetate (PRED FORTE) 1 % ophthalmic suspension 2 drop  2 drop Left Eye Daily Ivor Costa, MD   2 drop at 09/13/19 0940  . QUEtiapine (SEROQUEL) tablet 100 mg  100 mg Oral QHS Ivor Costa, MD   100 mg at 09/13/19 2131  . timolol (TIMOPTIC) 0.5 % ophthalmic solution 1 drop  1 drop Both Eyes BID Ivor Costa, MD   1 drop at 09/13/19 2210  . traZODone (DESYREL) tablet 50 mg  50 mg Oral QHS Ivor Costa, MD   50 mg at 09/13/19 2210  . umeclidinium-vilanterol (ANORO ELLIPTA) 62.5-25 MCG/INH 1 puff  1 puff Inhalation Daily Ivor Costa, MD        Musculoskeletal: Strength & Muscle Tone: decreased Gait & Station: did not witness Patient leans: N/A  Psychiatric Specialty Exam: Physical Exam  Nursing note and vitals reviewed. Constitutional: He is oriented to person, place, and time. He appears well-developed and well-nourished.  HENT:  Head: Normocephalic.  Respiratory: Effort normal.  Musculoskeletal:     Cervical back: Normal range of motion.  Neurological: He is alert and oriented to person, place, and time.  Psychiatric: His speech is normal. Judgment and thought content normal. He is actively hallucinating. Cognition and memory are normal. He exhibits a depressed mood.    Review of Systems  Psychiatric/Behavioral: Positive for dysphoric mood, hallucinations and sleep disturbance.  All other systems reviewed and are negative.    Blood pressure (!) 145/65, pulse 64, temperature 98 F (36.7 C), temperature source Oral, resp. rate 18, height 5\' 11"  (1.803 m), weight 108 kg, SpO2 99 %.Body mass index is 33.21 kg/m.  General Appearance: Casual  Eye  Contact:  Fair  Speech:  Normal Rate  Volume:  Normal  Mood:  Depressed  Affect:  Congruent  Thought Process:  Coherent and Descriptions of Associations: Intact  Orientation:  Full (Time, Place, and Person)  Thought Content:  Hallucinations: Visual  Suicidal Thoughts:  No  Homicidal Thoughts:  No  Memory:  Immediate;   Fair Recent;   Fair Remote;   Fair  Judgement:  Fair  Insight:  Fair  Psychomotor Activity:  Decreased  Concentration:  Concentration: Fair and Attention Span: Fair  Recall:  [employer] of Knowledge:  Good  Language:  Good  Akathisia:  No  Handed:  Right  AIMS (if indicated):     Assets:  Housing Leisure Time Resilience Social Support  ADL's:  Impaired  Cognition:  WNL  Sleep:      85 year old male presented to the emergency room with acute encephalopathy and consult placed for hallucinations and agitation.  History of depression and anxiety, recently stopped taking Wellbutrin and started Cymbalta.  His depression increased a month ago with visual hallucinations starting, non threatening, not command type.  Reports Seroquel is not effective for sleep or hallucinations.  No suicidal/homcidal ideations or substance abuse.  Treatment Plan Summary: Major depressive disorder, recurrent, moderate: -Continue Cymbalta 60 mg daily, recently changed from Wellbutrin  Hallucinations: -Recommend discontinue Seroquel 100 mg -Start Risperdal 0.5 mg BID -Consider Remeron 7.5 mg at bedtime for sleep IF Risperdal not working for sleep also  Disposition: No evidence of imminent risk to self or others at present.   Supportive therapy provided about ongoing stressors.  Waylan Boga, NP 09/14/2019 5:18 PM

## 2019-09-15 DIAGNOSIS — G9341 Metabolic encephalopathy: Secondary | ICD-10-CM | POA: Diagnosis not present

## 2019-09-15 MED ORDER — RISPERIDONE 1 MG/ML PO SOLN
0.5000 mg | Freq: Two times a day (BID) | ORAL | Status: DC
  Administered 2019-09-15: 0.5 mg via ORAL
  Filled 2019-09-15 (×2): qty 0.5

## 2019-09-15 MED ORDER — RISPERIDONE 0.5 MG PO TABS: 0.5000 mg | ORAL_TABLET | Freq: Two times a day (BID) | ORAL | 0 refills | Status: DC

## 2019-09-15 MED ORDER — OXYCODONE HCL 5 MG PO TABS: 10.0000 mg | ORAL_TABLET | Freq: Three times a day (TID) | ORAL | Status: DC

## 2019-09-15 NOTE — Discharge Summary (Signed)
Physician Discharge Summary  Aaron Gibbs [id number] DOB: [birth year] DOA: 09/13/2019  PCP: Aaron Hauser, DO  Admit date: 09/13/2019 Discharge date: 09/15/2019  Admitted From: Home Disposition: Home with home health  Recommendations for Outpatient Follow-up:  1. Follow up with PCP in 1-2 weeks 2. Request referral to psychiatry  Home Health: Yes Equipment/Devices: No  Discharge Condition: Stable CODE STATUS: DNR Diet recommendation: Heart Healthy  Brief/Interim Summary:  [id number] [name] a 85 y.o.malewith medical history significant ofhypertension, hyperlipidemia, COPD, GERD, depression, glaucoma, iron deficiency anemia chronic pain syndrome, who presents with confusion and hallucination.  Per EDP, pt had syncope, but Aaron Gibbs obtained different medical history with ED physician. Per her daughter (I called her daughter by phone), pthas been confused intermittently recently, he has hallucination, seeing dogs in the lobby. He fell several times, last fall was on 09/05/2019. He hasleft knee pain. He does not have unilateral numbness or tingling his extremities. No facial droop or slurred speech.Per her daughter, patient is confusedtoday,he pushedmedical alert in his neck, andEMS came. I haverepeatedly asked her daughter if patient passed out, she states that patient absolutely did not pass out. Patient has dry cough, some shortness breath, no chest pain. No fever or chills. Patient does not have nausea,vomiting, diarrhea, abdominal painorsymptoms of UTI. Per his daughter,patient is taking multiple medications for pain and depression. His medication listwas recently reviewed by pharmacist, who recommended to discontinue Aaron Gibbs, but the patient is still taking his medication  3/28: Patient seen and examined.  Daughter at bedside.  Patient very lethargic but does awaken on prompting.  Unable to provide any reliable history.  Per the patient's  daughter the patient is taking a number of medications prescribed by numerous providers.  The daughter is working with patient's primary care doctor and an outpatient pharmacist in order to titrate his medication regimen.  Again per the daughter the patient does have a history of medication and alcohol abuse though the alcohol abuse is not a current issue.  3/29: Patient seen and examined.  Caregiver at bedside.  Patient more awake this morning.  Psychiatry consult noted medication recommendations appreciated.  Recommendations follow.  Case discussed at length with case management.  Case management was able to reach out to the patient's family as well as establish him with home health PT, OT, home health aide, social work.  These will provide some resources outpatient.  I had a lengthy discussion with the patient's daughter and recommended that the patient reestablished with primary care and psychiatry.  Etiology of the patient's hallucinations and mental disturbances is not clearly identified at this time.  Imaging survey and laboratory work-up while in hospital overall unrevealing.  Patient's baseline health overall stable.  Changes made in home psychiatric regimen.  I had a lengthy discussion with the patient regarding his recommendations prior to discharge.  I explained that his medication changes may take a while to take effect and that he needs close outpatient follow-up with PCP and psychiatry.  Patient expressed understanding.  All questions were answered.  Patient discharged home in stable condition.  Discharge Diagnoses:  Principal Problem:   Acute metabolic encephalopathy Active Problems:   Chronic pain syndrome   Essential hypertension   GERD (gastroesophageal reflux disease)   Major depressive disorder, recurrent episode, severe (HCC)   Long term current use of opiate analgesic   COPD (chronic obstructive pulmonary disease) (HCC)   HLD (hyperlipidemia)   Iron deficiency anemia    Fall  Acute metabolic encephalopathyandhallucination:  Etiology is not  clear.  CT of head is negative.  MRI brain negative Suspect progressive dementia with behavioral changes versus medication effect secondary to polypharmacy Substance abuse remains on differential to rule out lower after speaking with the daughter Potential adverse effects from Aaron Gibbs Plan: Continue frequent neuro checks Psychiatry consulted, recommendations appreciated Per psych recommendations, continuing Aaron Gibbs at home dose, DC Aaron Gibbs, DC Aaron Gibbs.  Initiate Aaron Gibbs 0.5 mg twice daily.  Patient needs to establish with outpatient psychiatry and will need close follow-up and potential titration of his medications for continued hallucinations.  Imaging and laboratory work-up in the hospital negative.  Chronic pain syndrome: -Continue home oxycodone 5 mg 3 times daily -Hold Aaron Gibbs  HTN:  -Continue home medications:Amlodipine   GERD (gastroesophageal reflux disease): -Protonix  Major depressive disorder, recurrent episode, severe (Aaron Gibbs): -Hold Aaron Gibbs Aaron Gibbs discontinued Continue Aaron Gibbs Aaron Gibbs 0.5 mg twice daily added  COPD (chronic obstructive pulmonary disease) ([place]): stable -Bronchodilators  HLD (hyperlipidemia) -Fenofibrate  Iron deficiency anemia:Hemoglobin stable at 13.7 -Continue iron supplement  Fall:CT head negative. X-ray of her left knee negative -PT/OT, home health physical therapy  Discharge Instructions  Discharge Instructions    Diet - low sodium heart healthy   Complete by: As directed    Discharge instructions   Complete by: As directed    You are being discharged home with home health services today.  You have been seen by the psychiatric consultation service while here in the hospital.  Please see below for other medication recommendations.  Continue Aaron Gibbs 60 mg a day as per previous home dose Discontinue Aaron Gibbs Start risperidone 0.5  mg twice a day Continue remainder of medications  Please see your outpatient primary care doctor as well as your outpatient psychiatrist to discuss these medication changes   Increase activity slowly   Complete by: As directed      Allergies as of 09/15/2019      Reactions   Azithromycin Shortness Of Breath      Medication List    STOP taking these medications   buPROPion 150 MG 12 hr tablet Commonly known as: Aaron Gibbs SR   QUEtiapine 100 MG tablet Commonly known as: Aaron Gibbs     TAKE these medications   albuterol 108 (90 Base) MCG/ACT inhaler Commonly known as: VENTOLIN HFA Inhale 1-2 puffs into the lungs every 6 (six) hours as needed for wheezing or shortness of breath.   amLODipine 10 MG tablet Commonly known as: NORVASC Take 10 mg by mouth daily.   Anoro Ellipta 62.5-25 MCG/INH Aepb Generic drug: umeclidinium-vilanterol Inhale 1 puff into the lungs daily.   aspirin EC 81 MG tablet Take 81 mg by mouth daily.   bacitracin ophthalmic ointment 1 application as needed. Apply to affected eye   diclofenac sodium 1 % Gel Commonly known as: VOLTAREN Apply 1 application topically as needed.   DULoxetine 60 MG capsule Commonly known as: Aaron Gibbs Take 1 capsule (60 mg total) by mouth daily.   fenofibrate 145 MG tablet Commonly known as: TRICOR Take 145 mg by mouth daily.   ferrous sulfate 325 (65 FE) MG tablet Take 325 mg by mouth daily with breakfast.   meloxicam 15 MG tablet Commonly known as: MOBIC Take 1 tablet (15 mg total) by mouth daily.   multivitamin with minerals Tabs tablet Take 1 tablet by mouth daily.   Oxycodone HCl 10 MG Tabs Take 1 tablet (10 mg total) by mouth 3 (three) times daily as needed. Must last 30 days.   Oxycodone HCl 10 MG Tabs Take 1  tablet (10 mg total) by mouth 3 (three) times daily as needed. Must last 30 days.   Oxycodone HCl 10 MG Tabs Take 1 tablet (10 mg total) by mouth 3 (three) times daily as needed. Must last 30  days. Start taking on: September 24, 2019   pantoprazole 40 MG tablet Commonly known as: PROTONIX Take 40 mg by mouth daily.   prednisoLONE acetate 1 % ophthalmic suspension Commonly known as: PRED FORTE Place 2 drops into the left eye daily.   pregabalin 150 MG capsule Commonly known as: LYRICA Take 1 capsule (150 mg total) by mouth 3 (three) times daily. Must last 30 days   risperiDONE 0.5 MG tablet Commonly known as: Aaron Gibbs Take 1 tablet (0.5 mg total) by mouth 2 (two) times daily.   timolol 0.5 % ophthalmic solution Commonly known as: TIMOPTIC 1 drop 2 (two) times daily. Apply one drop to eye(s)   traZODone 50 MG tablet Commonly known as: DESYREL TAKE 1 TABLET BY MOUTH AT BEDTIME       Allergies  Allergen Reactions  . Azithromycin Shortness Of Breath    Consultations:  Psychiatry   Procedures/Studies: CT Head Wo Contrast  Result Date: 09/13/2019 CLINICAL DATA:  Altered mental status EXAM: CT HEAD WITHOUT CONTRAST TECHNIQUE: Contiguous axial images were obtained from the base of the skull through the vertex without intravenous contrast. COMPARISON:  03/29/2019 FINDINGS: Brain: No evidence of acute infarction, hemorrhage, hydrocephalus, extra-axial collection or mass lesion/mass effect. Vascular: No hyperdense vessel or unexpected calcification. Skull: No osseous abnormality. Sinuses/Orbits: Visualized paranasal sinuses are clear. Visualized mastoid sinuses are clear. Visualized orbits demonstrate no focal abnormality. Other: None IMPRESSION: 1. No acute intracranial pathology. 2. Chronic microvascular disease and cerebral atrophy. Electronically Signed   By: Kathreen Devoid   On: 09/13/2019 05:39   MR BRAIN WO CONTRAST  Result Date: 09/13/2019 CLINICAL DATA:  Encephalopathy. Confusion and hallucinations. EXAM: MRI HEAD WITHOUT CONTRAST TECHNIQUE: Multiplanar, multiecho pulse sequences of the brain and surrounding structures were obtained without intravenous contrast.  COMPARISON:  Head CT 09/13/2019 FINDINGS: Brain: There is no evidence of acute infarct, intracranial hemorrhage, mass, midline shift, or extra-axial fluid collection. Patchy T2 hyperintensities in the cerebral white matter bilaterally are nonspecific but compatible with moderate chronic small vessel ischemic disease. There is mild cerebral atrophy. Vascular: Major intracranial vascular flow voids are preserved. Skull and upper cervical spine: Unremarkable bone marrow signal. Sinuses/Orbits: Bilateral cataract extraction. Paranasal sinuses and mastoid air cells are clear. Other: 1.5 cm T2 hypointense nodule in the superficial aspect of the right parotid gland. IMPRESSION: 1. No acute intracranial abnormality. 2. Moderate chronic small vessel ischemic disease. 3. 1.5 cm right parotid mass, nonspecific though may reflect a primary parotid neoplasm. Electronically Signed   By: Logan Bores M.D.   On: 09/13/2019 19:35   DG Chest Port 1 View  Result Date: 09/13/2019 CLINICAL DATA:  Syncopal episode. EXAM: PORTABLE CHEST 1 VIEW COMPARISON:  03/30/2019 FINDINGS: There is mild lingular scarring. There is minimal right middle lobe scarring. There is no focal consolidation. There is no pleural effusion or pneumothorax. The heart and mediastinal contours are unremarkable. There is no acute osseous abnormality. IMPRESSION: No acute cardiopulmonary disease. Electronically Signed   By: Kathreen Devoid   On: 09/13/2019 05:37   DG Knee Complete 4 Views Left  Result Date: 09/13/2019 CLINICAL DATA:  Status post fall, syncopal episode EXAM: LEFT KNEE - COMPLETE 4+ VIEW COMPARISON:  None. FINDINGS: No acute fracture or dislocation. No aggressive osseous lesion.  Mild lateral femorotibial compartment joint space narrowing. No joint effusion. No soft tissue abnormality. Peripheral vascular atherosclerotic disease. IMPRESSION: No acute osseous injury of the left knee. Electronically Signed   By: Kathreen Devoid   On: 09/13/2019 04:13     (Echo, Carotid, EGD, Colonoscopy, ERCP)    Subjective: Patient seen and examined on day of discharge Medically stable for discharge home Post discharge instructions explained at length the patient at bedside.  Patient stressed understanding  Discharge Exam: Vitals:   09/15/19 0922 09/15/19 1213  BP: (!) 123/57 (!) 106/55  Pulse:  60  Resp:  16  Temp:  98.4 F (36.9 C)  SpO2:  96%   Vitals:   09/14/19 1948 09/15/19 0502 09/15/19 0922 09/15/19 1213  BP: 126/78 (!) 101/56 (!) 123/57 (!) 106/55  Pulse: 64 (!) 57  60  Resp: 18 18  16   Temp: 98.3 F (36.8 C) 97.9 F (36.6 C)  98.4 F (36.9 C)  TempSrc: Oral Oral  Oral  SpO2: 95% 94%  96%  Weight:      Height:        General: Pt is alert, awake, not in acute distress Cardiovascular: RRR, S1/S2 +, no rubs, no gallops Respiratory: CTA bilaterally, no wheezing, no rhonchi Abdominal: Soft, NT, ND, bowel sounds + Extremities: no edema, no cyanosis    The results of significant diagnostics from this hospitalization (including imaging, microbiology, ancillary and laboratory) are listed below for reference.     Microbiology: Recent Results (from the past 240 hour(s))  Blood Culture (routine x 2)     Status: None (Preliminary result)   Collection Time: 09/13/19  4:45 AM   Specimen: BLOOD  Result Value Ref Range Status   Specimen Description BLOOD LEFT ASSIST CONTROL  Final   Special Requests   Final    BOTTLES DRAWN AEROBIC AND ANAEROBIC Blood Culture adequate volume   Culture   Final    NO GROWTH 2 DAYS Performed at [facility], [address] [place], [place] [address]    Report Status PENDING  Incomplete  Respiratory Panel by RT PCR (Flu A&B, Covid) - Nasopharyngeal Swab     Status: None   Collection Time: 09/13/19  6:03 AM   Specimen: Nasopharyngeal Swab  Result Value Ref Range Status   SARS Coronavirus 2 by RT PCR NEGATIVE NEGATIVE Final    Comment: (NOTE) SARS-CoV-2 target nucleic acids are NOT  DETECTED. The SARS-CoV-2 RNA is generally detectable in upper respiratoy specimens during the acute phase of infection. The lowest concentration of SARS-CoV-2 viral copies this assay can detect is 131 copies/mL. A negative result does not preclude SARS-Cov-2 infection and should not be used as the sole basis for treatment or other patient management decisions. A negative result may occur with  improper specimen collection/handling, submission of specimen other than nasopharyngeal swab, presence of viral mutation(s) within the areas targeted by this assay, and inadequate number of viral copies (<131 copies/mL). A negative result must be combined with clinical observations, patient history, and epidemiological information. The expected result is Negative. Fact Sheet for Patients:  PinkCheek.be Fact Sheet for Healthcare Providers:  GravelBags.it This test is not yet ap proved or cleared by the [place] FDA and  has been authorized for detection and/or diagnosis of SARS-CoV-2 by FDA under an Emergency Use Authorization (EUA). This EUA will remain  in effect (meaning this test can be used) for the duration of the COVID-19 declaration under Section 564(b)(1) of the Act, 21 U.S.C.  section 360bbb-3(b)(1), unless the authorization is terminated or revoked sooner.    Influenza A by PCR NEGATIVE NEGATIVE Final   Influenza B by PCR NEGATIVE NEGATIVE Final    Comment: (NOTE) The Xpert Xpress SARS-CoV-2/FLU/RSV assay is intended as an aid in  the diagnosis of influenza from Nasopharyngeal swab specimens and  should not be used as a sole basis for treatment. Nasal washings and  aspirates are unacceptable for Xpert Xpress SARS-CoV-2/FLU/RSV  testing. Fact Sheet for Patients: PinkCheek.be Fact Sheet for Healthcare Providers: GravelBags.it This test is not yet approved or cleared  by the [place] FDA and  has been authorized for detection and/or diagnosis of SARS-CoV-2 by  FDA under an Emergency Use Authorization (EUA). This EUA will remain  in effect (meaning this test can be used) for the duration of the  Covid-19 declaration under Section 564(b)(1) of the Act, 21  U.S.C. section 360bbb-3(b)(1), unless the authorization is  terminated or revoked. Performed at [facility], [address] [place], [place] [address]      Labs: BNP (last 3 results) Recent Labs    02/13/19 1748 03/28/19 2132  BNP 35.0 [id number]   Basic Metabolic Panel: Recent Labs  Lab 09/13/19 0138 09/14/19 0518  NA 139 140  K 4.2 4.0  CL 107 111  CO2 24 25  GLUCOSE 103* 106*  BUN 23 21  CREATININE 1.13 1.05  CALCIUM 9.7 9.2   Liver Function Tests: Recent Labs  Lab 09/13/19 0138  AST 27  ALT 23  ALKPHOS 44  BILITOT 0.8  PROT 7.2  ALBUMIN 4.3   No results for input(s): LIPASE, AMYLASE in the last 168 hours. Recent Labs  Lab 09/13/19 0444  AMMONIA 25   CBC: Recent Labs  Lab 09/13/19 0138 09/14/19 0518  WBC 5.1 4.2  HGB 13.7 12.7*  HCT 42.9 39.4  MCV 89.6 88.7  PLT 222 200   Cardiac Enzymes: Recent Labs  Lab 09/13/19 0138  CKTOTAL 24*   BNP: Invalid input(s): POCBNP CBG: Recent Labs  Lab 09/13/19 0136  GLUCAP 102*   D-Dimer No results for input(s): DDIMER in the last 72 hours. Hgb A1c No results for input(s): HGBA1C in the last 72 hours. Lipid Profile No results for input(s): CHOL, HDL, LDLCALC, TRIG, CHOLHDL, LDLDIRECT in the last 72 hours. Thyroid function studies Recent Labs    09/13/19 1243  TSH 2.782   Anemia work up Recent Labs    09/13/19 1243  VITAMINB12 479   Urinalysis    Component Value Date/Time   COLORURINE YELLOW (A) 03/28/2019 2226   APPEARANCEUR HAZY (A) 03/28/2019 2226   LABSPEC 1.025 03/28/2019 2226   PHURINE 5.0 03/28/2019 2226   [place] 03/28/2019 2226   HGBUR NEGATIVE 03/28/2019 2226    [place] 03/28/2019 2226   [place] 03/28/2019 2226   PROTEINUR 30 (A) 03/28/2019 2226   NITRITE NEGATIVE 03/28/2019 2226   LEUKOCYTESUR MODERATE (A) 03/28/2019 2226   Sepsis Labs Invalid input(s): PROCALCITONIN,  WBC,  LACTICIDVEN Microbiology Recent Results (from the past 240 hour(s))  Blood Culture (routine x 2)     Status: None (Preliminary result)   Collection Time: 09/13/19  4:45 AM   Specimen: BLOOD  Result Value Ref Range Status   Specimen Description BLOOD LEFT ASSIST CONTROL  Final   Special Requests   Final    BOTTLES DRAWN AEROBIC AND ANAEROBIC Blood Culture adequate volume   Culture   Final    NO GROWTH 2 DAYS Performed at  [facility], [address], [place], [place] [address]    Report Status PENDING  Incomplete  Respiratory Panel by RT PCR (Flu A&B, Covid) - Nasopharyngeal Swab     Status: None   Collection Time: 09/13/19  6:03 AM   Specimen: Nasopharyngeal Swab  Result Value Ref Range Status   SARS Coronavirus 2 by RT PCR NEGATIVE NEGATIVE Final    Comment: (NOTE) SARS-CoV-2 target nucleic acids are NOT DETECTED. The SARS-CoV-2 RNA is generally detectable in upper respiratoy specimens during the acute phase of infection. The lowest concentration of SARS-CoV-2 viral copies this assay can detect is 131 copies/mL. A negative result does not preclude SARS-Cov-2 infection and should not be used as the sole basis for treatment or other patient management decisions. A negative result may occur with  improper specimen collection/handling, submission of specimen other than nasopharyngeal swab, presence of viral mutation(s) within the areas targeted by this assay, and inadequate number of viral copies (<131 copies/mL). A negative result must be combined with clinical observations, patient history, and epidemiological information. The expected result is Negative. Fact Sheet for Patients:  PinkCheek.be Fact  Sheet for Healthcare Providers:  GravelBags.it This test is not yet ap proved or cleared by the [place] FDA and  has been authorized for detection and/or diagnosis of SARS-CoV-2 by FDA under an Emergency Use Authorization (EUA). This EUA will remain  in effect (meaning this test can be used) for the duration of the COVID-19 declaration under Section 564(b)(1) of the Act, 21 U.S.C. section 360bbb-3(b)(1), unless the authorization is terminated or revoked sooner.    Influenza A by PCR NEGATIVE NEGATIVE Final   Influenza B by PCR NEGATIVE NEGATIVE Final    Comment: (NOTE) The Xpert Xpress SARS-CoV-2/FLU/RSV assay is intended as an aid in  the diagnosis of influenza from Nasopharyngeal swab specimens and  should not be used as a sole basis for treatment. Nasal washings and  aspirates are unacceptable for Xpert Xpress SARS-CoV-2/FLU/RSV  testing. Fact Sheet for Patients: PinkCheek.be Fact Sheet for Healthcare Providers: GravelBags.it This test is not yet approved or cleared by the [place] FDA and  has been authorized for detection and/or diagnosis of SARS-CoV-2 by  FDA under an Emergency Use Authorization (EUA). This EUA will remain  in effect (meaning this test can be used) for the duration of the  Covid-19 declaration under Section 564(b)(1) of the Act, 21  U.S.C. section 360bbb-3(b)(1), unless the authorization is  terminated or revoked. Performed at [facility], [address] [place], [place] [address]      Time coordinating discharge: Over 30 minutes  SIGNED:   Sidney Ace, MD  [facility] 09/15/2019, 4:40 PM Pager   If 7PM-7AM, please contact night-coverage

## 2019-09-15 NOTE — Progress Notes (Signed)
Pt discharged per MD order. Discharge instructions reviewed with pt and his caregiver. IV removed. Pt and caregiver verbalized understanding with all questions answered to satisfaction. Pt taken to car in wheelchair by staff.

## 2019-09-15 NOTE — Care Management Obs Status (Signed)
[place] NOTIFICATION   Patient Details  Name: Aaron Gibbs MRN: [id number] Date of Birth: 08-19-1934   Medicare Observation Status Notification Given:  Yes    Beverly Sessions, RN 09/15/2019, 12:25 PM

## 2019-09-15 NOTE — TOC Initial Note (Signed)
Transition of Care [facility]) - Initial/Assessment Note    Patient Details  Name: Aaron Gibbs MRN: [id number] Date of Birth: 1934-10-13  Transition of [facility]) CM/SW Contact:    Beverly Sessions, RN Phone Number: 09/15/2019, 2:27 PM  Clinical Narrative:                  Patient admitted from home with acute encephalopathy  Patient lives in his own home. Daughter lives 2 blocks away.  Patient has private pay caregiver, Monday through Friday 5 hours a day  Daughter states patient used to have priviate pay caregivers at night, however they discontinued those.  Daughter states depending on how he does when he gets home they may have to have someone come back again at night.   Dr. Parks Ranger is PCP - care giver provides transportation  Pharmacy - [place].  Denies issues obtaining medications  PT has assessed patient and recommends home health PT.  Patient agreeable.  States he has had [name] in the past and would like for me to check with them first.   Referral made and accepted by Helene Kelp with [name]  Daughter and patient updated.  Caregiver to transport at discharge  Expected Discharge Plan: [place] Barriers to Discharge: No Barriers Identified   Patient Goals and CMS Choice        Expected Discharge Plan and Services Expected Discharge Plan: [place]         Expected Discharge Date: 09/15/19                         HH Arranged: PT, OT, Nurse's Aide, Social Work [employer] Agency: [name] [facility] [employer] Date [place]: 09/15/19   Representative spoke with at [place] Arrangements/Services     Patient language and need for interpreter reviewed:: Yes Do you feel safe going back to the place where you live?: Yes      Need for Family Participation in Patient Care: Yes (Comment) Care giver support system in place?: Yes (comment)   Criminal Activity/Legal Involvement Pertinent to Current Situation/Hospitalization: No - Comment as needed  Activities of Daily Living Home Assistive Devices/Equipment: Gilford Rile (specify type) ADL Screening (condition at time of admission) Patient's cognitive ability adequate to safely complete daily activities?: Yes Is the patient deaf or have difficulty hearing?: No Does the patient have difficulty seeing, even when wearing glasses/contacts?: No Does the patient have difficulty concentrating, remembering, or making decisions?: Yes Patient able to express need for assistance with ADLs?: Yes Does the patient have difficulty dressing or bathing?: No Independently performs ADLs?: Yes (appropriate for developmental age) Does the patient have difficulty walking or climbing stairs?: No Weakness of Legs: Both Weakness of Arms/Hands: None  Permission Sought/Granted                  Emotional Assessment Appearance:: Appears stated age Attitude/Demeanor/Rapport: Gracious Affect (typically observed): Accepting Orientation: : Oriented to Self, Oriented to Place, Oriented to  Time, Oriented to Situation   Psych Involvement: No (comment)  Admission diagnosis:  Transient alteration of awareness [R40.4] Syncope and collapse [R55] Cough [R05] Chronic pain syndrome 123456 Acute metabolic encephalopathy 99991111 Patient Active Problem List   Diagnosis Date Noted  . COPD (chronic obstructive pulmonary disease) ([place])   . HLD (hyperlipidemia)   . Iron deficiency anemia   . Fall   .  Acute postoperative pain 06/10/2019  . Medial meniscal tear, sequela (Left) 06/10/2019  . Patellar tendinosis (Right) 06/10/2019  . Lateral meniscal tear, sequela (Left) 03/03/2019  . Palpitation 02/13/2019  . Preop testing 11/14/2018  . Noncompliance with medication treatment due to overuse of medication 10/23/2018  . Neurogenic pain 08/20/2018  . Atherosclerotic peripheral vascular disease ([place]) 07/24/2018   . Tricompartment osteoarthritis of knee (Left) 07/24/2018  . Osteoarthritis of knee (Bilateral) 07/24/2018  . Osteoarthritis of patellofemoral joint (Right) 07/24/2018  . History of suicide attempt (06/12/18) 07/24/2018  . Long term current use of opiate analgesic 07/10/2018  . Pharmacologic therapy 07/10/2018  . Disorder of skeletal system 07/10/2018  . Problems influencing health status 07/10/2018  . Suicide attempt ([place]) (05/13/18) 06/26/2018  . Acute metabolic encephalopathy 123456  . 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 Major depressive disorder, recurrent episode, severe ([place]) 06/04/2018  . Blindness 05/10/2018  . Suicidal ideation 05/10/2018  . Chronic pain syndrome 05/01/2018  . DISH (diffuse idiopathic skeletal hyperostosis) 05/01/2018  . Osteoarthritis of multiple joints 05/01/2018  . Chronic knee pain (Primary Area of Pain) (Bilateral) (L>R) 05/01/2018  . Retinitis pigmentosa of both eyes 05/01/2018  . Glaucoma of both eyes 05/01/2018  . Essential hypertension 05/01/2018  . Chronic low back pain (Bilateral) w/o sciatica 05/01/2018  . GERD (gastroesophageal reflux disease) 05/01/2018  . 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 Therapeutic opioid-induced constipation (OIC) 05/01/2018  . Centrilobular emphysema ([place]) 05/01/2018   PCP:  Olin Hauser, DO Pharmacy:   [facility], [address] [place] [address] Phone: [phone] Fax: [phone]     Social Determinants of Health (SDOH) Interventions    Readmission Risk Interventions No flowsheet data found.

## 2019-09-18 LAB — CULTURE, BLOOD (ROUTINE X 2)
Culture: NO GROWTH
Special Requests: ADEQUATE

## 2019-09-22 ENCOUNTER — Telehealth: Payer: Self-pay

## 2019-09-22 ENCOUNTER — Other Ambulatory Visit: Payer: Self-pay | Admitting: Family Medicine

## 2019-09-22 DIAGNOSIS — J432 Centrilobular emphysema: Secondary | ICD-10-CM

## 2019-09-22 NOTE — Telephone Encounter (Signed)
Acknowledged  [name], DO [facility] 09/22/2019, 1:31 PM

## 2019-09-22 NOTE — Telephone Encounter (Signed)
Verbal given for once a week for one week, twice a week for 6 weeks and once a week for 2 weeks to [place] (t [phone])  in home PT.

## 2019-09-24 ENCOUNTER — Telehealth: Payer: Medicare Other | Admitting: Pain Medicine

## 2019-09-25 ENCOUNTER — Inpatient Hospital Stay
Admission: EM | Admit: 2019-09-25 | Discharge: 2019-09-27 | DRG: 177 | Disposition: A | Discharge status: Home / Self Care | Payer: Medicare Other | Attending: Internal Medicine | Admitting: Internal Medicine

## 2019-09-25 ENCOUNTER — Emergency Department: Payer: Medicare Other

## 2019-09-25 ENCOUNTER — Other Ambulatory Visit: Payer: Self-pay

## 2019-09-25 DIAGNOSIS — K219 Gastro-esophageal reflux disease without esophagitis: Secondary | ICD-10-CM

## 2019-09-25 DIAGNOSIS — J449 Chronic obstructive pulmonary disease, unspecified: Secondary | ICD-10-CM | POA: Diagnosis present

## 2019-09-25 DIAGNOSIS — M810 Age-related osteoporosis without current pathological fracture: Secondary | ICD-10-CM | POA: Diagnosis present

## 2019-09-25 DIAGNOSIS — J690 Pneumonitis due to inhalation of food and vomit: Secondary | ICD-10-CM | POA: Diagnosis present

## 2019-09-25 DIAGNOSIS — I10 Essential (primary) hypertension: Secondary | ICD-10-CM | POA: Diagnosis present

## 2019-09-25 DIAGNOSIS — H409 Unspecified glaucoma: Secondary | ICD-10-CM | POA: Diagnosis present

## 2019-09-25 DIAGNOSIS — F329 Major depressive disorder, single episode, unspecified: Secondary | ICD-10-CM | POA: Diagnosis present

## 2019-09-25 DIAGNOSIS — F0391 Unspecified dementia with behavioral disturbance: Secondary | ICD-10-CM | POA: Diagnosis not present

## 2019-09-25 DIAGNOSIS — J189 Pneumonia, unspecified organism: Secondary | ICD-10-CM

## 2019-09-25 DIAGNOSIS — Z20822 Contact with and (suspected) exposure to covid-19: Secondary | ICD-10-CM | POA: Diagnosis present

## 2019-09-25 DIAGNOSIS — F419 Anxiety disorder, unspecified: Secondary | ICD-10-CM | POA: Diagnosis present

## 2019-09-25 DIAGNOSIS — F17290 Nicotine dependence, other tobacco product, uncomplicated: Secondary | ICD-10-CM | POA: Diagnosis present

## 2019-09-25 DIAGNOSIS — J9601 Acute respiratory failure with hypoxia: Secondary | ICD-10-CM | POA: Diagnosis present

## 2019-09-25 DIAGNOSIS — Z79899 Other long term (current) drug therapy: Secondary | ICD-10-CM

## 2019-09-25 LAB — CBC WITH DIFFERENTIAL/PLATELET
Abs Immature Granulocytes: 0.06 10*3/uL (ref 0.00–0.07)
Basophils Absolute: 0 10*3/uL (ref 0.0–0.1)
Basophils Relative: 0 %
Eosinophils Absolute: 0.4 10*3/uL (ref 0.0–0.5)
Eosinophils Relative: 4 %
HCT: 39.8 % (ref 39.0–52.0)
Hemoglobin: 12.9 g/dL — ABNORMAL LOW (ref 13.0–17.0)
Immature Granulocytes: 1 %
Lymphocytes Relative: 12 %
Lymphs Abs: 1.1 10*3/uL (ref 0.7–4.0)
MCH: 28.7 pg (ref 26.0–34.0)
MCHC: 32.4 g/dL (ref 30.0–36.0)
MCV: 88.6 fL (ref 80.0–100.0)
Monocytes Absolute: 2.2 10*3/uL — ABNORMAL HIGH (ref 0.1–1.0)
Monocytes Relative: 22 %
Neutro Abs: 6.1 10*3/uL (ref 1.7–7.7)
Neutrophils Relative %: 61 %
Platelets: 185 10*3/uL (ref 150–400)
RBC: 4.49 MIL/uL (ref 4.22–5.81)
RDW: 15.6 % — ABNORMAL HIGH (ref 11.5–15.5)
WBC: 9.9 10*3/uL (ref 4.0–10.5)
nRBC: 0 % (ref 0.0–0.2)

## 2019-09-25 LAB — TROPONIN I (HIGH SENSITIVITY)
Troponin I (High Sensitivity): 11 ng/L (ref <18)
Troponin I (High Sensitivity): 11 ng/L (ref <18)

## 2019-09-25 LAB — RESPIRATORY PANEL BY RT PCR (FLU A&B, COVID)
Influenza A by PCR: NEGATIVE
Influenza B by PCR: NEGATIVE
SARS Coronavirus 2 by RT PCR: NEGATIVE

## 2019-09-25 LAB — COMPREHENSIVE METABOLIC PANEL
ALT: 27 U/L (ref 0–44)
AST: 32 U/L (ref 15–41)
Albumin: 3.5 g/dL (ref 3.5–5.0)
Alkaline Phosphatase: 45 U/L (ref 38–126)
Anion gap: 7 (ref 5–15)
BUN: 14 mg/dL (ref 8–23)
CO2: 27 mmol/L (ref 22–32)
Calcium: 9.3 mg/dL (ref 8.9–10.3)
Chloride: 104 mmol/L (ref 98–111)
Creatinine, Ser: 1.19 mg/dL (ref 0.61–1.24)
GFR calc Af Amer: >60 mL/min (ref >60)
GFR calc non Af Amer: 57 mL/min — ABNORMAL LOW (ref >60)
Glucose, Bld: 125 mg/dL — ABNORMAL HIGH (ref 70–99)
Potassium: 3.9 mmol/L (ref 3.5–5.1)
Sodium: 138 mmol/L (ref 135–145)
Total Bilirubin: 0.8 mg/dL (ref 0.3–1.2)
Total Protein: 6.6 g/dL (ref 6.5–8.1)

## 2019-09-25 LAB — PROCALCITONIN: Procalcitonin: 0.32 ng/mL

## 2019-09-25 LAB — LACTIC ACID, PLASMA: Lactic Acid, Venous: 2 mmol/L (ref 0.5–1.9)

## 2019-09-25 LAB — BRAIN NATRIURETIC PEPTIDE: B Natriuretic Peptide: 130 pg/mL — ABNORMAL HIGH (ref 0.0–100.0)

## 2019-09-25 MED ORDER — ASPIRIN EC 81 MG PO TBEC
81.0000 mg | DELAYED_RELEASE_TABLET | Freq: Every day | ORAL | Status: DC
  Administered 2019-09-26: 11:00:00 81 mg via ORAL
  Filled 2019-09-25 (×2): qty 1

## 2019-09-25 MED ORDER — AMLODIPINE BESYLATE 10 MG PO TABS
10.0000 mg | ORAL_TABLET | Freq: Every day | ORAL | Status: DC
  Administered 2019-09-26 – 2019-09-27 (×2): 10 mg via ORAL
  Filled 2019-09-25: qty 2
  Filled 2019-09-25: qty 1

## 2019-09-25 MED ORDER — RISPERIDONE 0.5 MG PO TABS
0.5000 mg | ORAL_TABLET | Freq: Two times a day (BID) | ORAL | Status: DC
  Administered 2019-09-26 – 2019-09-27 (×3): 0.5 mg via ORAL
  Filled 2019-09-25 (×4): qty 1

## 2019-09-25 MED ORDER — VANCOMYCIN HCL 1250 MG/250ML IV SOLN
1250.0000 mg | INTRAVENOUS | Status: DC
  Filled 2019-09-25: qty 250

## 2019-09-25 MED ORDER — PIPERACILLIN-TAZOBACTAM 3.375 G IVPB
3.3750 g | Freq: Three times a day (TID) | INTRAVENOUS | Status: DC
  Administered 2019-09-25 – 2019-09-26 (×2): 3.375 g via INTRAVENOUS
  Filled 2019-09-25 (×2): qty 50

## 2019-09-25 MED ORDER — ENOXAPARIN SODIUM 40 MG/0.4ML ~~LOC~~ SOLN
40.0000 mg | SUBCUTANEOUS | Status: DC
  Administered 2019-09-26: 40 mg via SUBCUTANEOUS
  Filled 2019-09-25 (×2): qty 0.4

## 2019-09-25 MED ORDER — ADULT MULTIVITAMIN W/MINERALS CH
1.0000 | ORAL_TABLET | Freq: Every day | ORAL | Status: DC
  Administered 2019-09-27: 1 via ORAL
  Filled 2019-09-25: qty 1

## 2019-09-25 MED ORDER — PANTOPRAZOLE SODIUM 40 MG PO TBEC
40.0000 mg | DELAYED_RELEASE_TABLET | Freq: Every day | ORAL | Status: DC
  Administered 2019-09-26 – 2019-09-27 (×2): 40 mg via ORAL
  Filled 2019-09-25 (×2): qty 1

## 2019-09-25 MED ORDER — PIPERACILLIN-TAZOBACTAM 3.375 G IVPB 30 MIN: 3.3750 g | Freq: Once | INTRAVENOUS | Status: DC

## 2019-09-25 MED ORDER — ACETAMINOPHEN 650 MG RE SUPP: 650.0000 mg | Freq: Four times a day (QID) | RECTAL | Status: DC | PRN

## 2019-09-25 MED ORDER — SODIUM CHLORIDE 0.9 % IV SOLN: INTRAVENOUS | Status: DC

## 2019-09-25 MED ORDER — TRAZODONE HCL 50 MG PO TABS
25.0000 mg | ORAL_TABLET | Freq: Every evening | ORAL | Status: DC | PRN
  Administered 2019-09-26: 25 mg via ORAL
  Filled 2019-09-25: qty 1

## 2019-09-25 MED ORDER — TIMOLOL MALEATE 0.5 % OP SOLN
1.0000 [drp] | Freq: Two times a day (BID) | OPHTHALMIC | Status: DC
  Administered 2019-09-26 – 2019-09-27 (×3): 1 [drp] via OPHTHALMIC
  Filled 2019-09-25 (×2): qty 5

## 2019-09-25 MED ORDER — VANCOMYCIN HCL IN DEXTROSE 1-5 GM/200ML-% IV SOLN
1000.0000 mg | Freq: Once | INTRAVENOUS | Status: AC
  Administered 2019-09-25: 20:00:00 1000 mg via INTRAVENOUS
  Filled 2019-09-25: qty 200

## 2019-09-25 MED ORDER — FERROUS SULFATE 325 (65 FE) MG PO TABS
325.0000 mg | ORAL_TABLET | Freq: Every day | ORAL | Status: DC
  Administered 2019-09-26 – 2019-09-27 (×2): 325 mg via ORAL
  Filled 2019-09-25 (×2): qty 1

## 2019-09-25 MED ORDER — PREDNISOLONE ACETATE 1 % OP SUSP
2.0000 [drp] | Freq: Every day | OPHTHALMIC | Status: DC
  Administered 2019-09-27: 11:00:00 2 [drp] via OPHTHALMIC
  Filled 2019-09-25: qty 5
  Filled 2019-09-25: qty 1

## 2019-09-25 MED ORDER — FENOFIBRATE 160 MG PO TABS
160.0000 mg | ORAL_TABLET | Freq: Every day | ORAL | Status: DC
  Administered 2019-09-26 – 2019-09-27 (×2): 160 mg via ORAL
  Filled 2019-09-25 (×2): qty 1

## 2019-09-25 MED ORDER — PIPERACILLIN-TAZOBACTAM 3.375 G IVPB 30 MIN: 3.3750 g | Freq: Four times a day (QID) | INTRAVENOUS | Status: DC

## 2019-09-25 MED ORDER — DULOXETINE HCL 30 MG PO CPEP
60.0000 mg | ORAL_CAPSULE | Freq: Every day | ORAL | Status: DC
  Administered 2019-09-26 – 2019-09-27 (×2): 60 mg via ORAL
  Filled 2019-09-25: qty 2
  Filled 2019-09-25: qty 1

## 2019-09-25 MED ORDER — SODIUM CHLORIDE 0.9 % IV SOLN
100.0000 mg | Freq: Once | INTRAVENOUS | Status: AC
  Administered 2019-09-25: 100 mg via INTRAVENOUS
  Filled 2019-09-25: qty 100

## 2019-09-25 MED ORDER — ONDANSETRON HCL 4 MG/2ML IJ SOLN: 4.0000 mg | Freq: Four times a day (QID) | INTRAMUSCULAR | Status: DC | PRN

## 2019-09-25 MED ORDER — SODIUM CHLORIDE 0.9 % IV SOLN: 2.0000 g | Freq: Once | INTRAVENOUS | Status: DC

## 2019-09-25 MED ORDER — ACETAMINOPHEN 325 MG PO TABS
650.0000 mg | ORAL_TABLET | Freq: Four times a day (QID) | ORAL | Status: DC | PRN
  Administered 2019-09-26: 14:00:00 650 mg via ORAL
  Filled 2019-09-25: qty 2

## 2019-09-25 MED ORDER — OXYCODONE HCL 5 MG PO TABS
10.0000 mg | ORAL_TABLET | Freq: Four times a day (QID) | ORAL | Status: DC | PRN
  Administered 2019-09-26 – 2019-09-27 (×3): 10 mg via ORAL
  Filled 2019-09-25 (×3): qty 2

## 2019-09-25 MED ORDER — VANCOMYCIN HCL 1500 MG/300ML IV SOLN
1500.0000 mg | Freq: Once | INTRAVENOUS | Status: AC
  Administered 2019-09-26: 1500 mg via INTRAVENOUS
  Filled 2019-09-25: qty 300

## 2019-09-25 MED ORDER — ONDANSETRON HCL 4 MG PO TABS: 4.0000 mg | ORAL_TABLET | Freq: Four times a day (QID) | ORAL | Status: DC | PRN

## 2019-09-25 MED ORDER — TRAZODONE HCL 50 MG PO TABS: 50.0000 mg | ORAL_TABLET | Freq: Every day | ORAL | Status: DC

## 2019-09-25 NOTE — ED Notes (Signed)
IV access attempted x3 by this RN, IV team consult placed, Dr. Jari Pigg informed

## 2019-09-25 NOTE — Consult Note (Signed)
CODE SEPSIS - PHARMACY COMMUNICATION  **Broad Spectrum Antibiotics should be administered within 1 hour of Sepsis diagnosis**  Time Code Sepsis Called/Page Received: 1958  Antibiotics Ordered: vancomycin and Zosyn  Time of 1st antibiotic administration: 2026  Additional action taken by pharmacy: none required  If necessary, Name of Provider/Nurse Contacted: N/A    Dallie Piles ,PharmD Clinical Pharmacist  09/25/2019  8:28 PM

## 2019-09-25 NOTE — ED Notes (Signed)
Sent pharmacy a message to verify meds 

## 2019-09-25 NOTE — Progress Notes (Signed)
Pharmacy Antibiotic Note  Aaron Gibbs is a 85 y.o. male admitted on 09/25/2019 with pneumonia.  Pharmacy has been consulted for Vancomycin dosing.  Plan: Vancomycin 1000 mg IV X 1 given in ED on 4/08 @ 2026.  Additional Vancomycin 1500 mg IV X 1 ordered to make total loading dose of 2500 mg.  Vancomycin 1250 mg IV Q24H ordered to continue on 4/9 @ 2000.   Vd = 54 L  Ke = 0.047 hr-1 T1/2 = 14.8 hrs AUC = 495.6  Vanc trough = 12 mcg/mL   Height: 5\' 11"  (180.3 cm) Weight: 108 kg (238 lb 1.6 oz) IBW/kg (Calculated) : 75.3  Temp (24hrs), Avg:98 F (36.7 C), Min:98 F (36.7 C), Max:98 F (36.7 C)  Recent Labs  Lab 09/25/19 1858  WBC 9.9  CREATININE 1.19    Estimated Creatinine Clearance: 59.8 mL/min (by C-G formula based on SCr of 1.19 mg/dL).    Allergies  Allergen Reactions  . Azithromycin Shortness Of Breath    Antimicrobials this admission:   >>    >>   Dose adjustments this admission:   Microbiology results:  BCx:   UCx:    Sputum:    MRSA PCR:   Thank you for allowing pharmacy to be a part of this patient's care.  Janet Decesare D 09/25/2019 10:01 PM

## 2019-09-25 NOTE — Consult Note (Signed)
PHARMACY -  BRIEF ANTIBIOTIC NOTE   Pharmacy has received consult(s) for vancomycin and cefepime from an ED provider.  The patient's profile has been reviewed for ht/wt/allergies/indication/available labs.    One time order(s) placed for   1) vancomycin 1 gram IV x 1 2) cefepime 2 grams IV x 1  Further antibiotics/pharmacy consults should be ordered by admitting physician if indicated.                       Thank you, Dallie Piles 09/25/2019  8:01 PM

## 2019-09-25 NOTE — ED Triage Notes (Signed)
Pt arrives via [facility] from home for [place] that started last night after choking up on a piece of pork chop. Pt also reports he choked on some milk today. Pt has labored breathing and arrives on 10L NRB, O2 97%.

## 2019-09-25 NOTE — H&P (Addendum)
[place] at [place] NAME: Aaron Gibbs    MR#:  [id number]  DATE OF BIRTH:  1935-05-28  DATE OF ADMISSION:  09/25/2019  PRIMARY CARE PHYSICIAN: Olin Hauser, DO   REQUESTING/REFERRING PHYSICIAN: Marjean Donna, MD  CHIEF COMPLAINT:   Chief Complaint  Patient presents with  . Shortness of Breath    HISTORY OF PRESENT ILLNESS:  Aaron Gibbs  is a 85 y.o. Caucasian male with a known history of COPD, glaucoma and anxiety, who presented to the emergency room with acute onset of worsening cough which has been occasionally productive of yellowish sputum, dyspnea without wheezing.  He denies any fever or chills.  He choked on pork chops yesterday while eating and milk today.  No headache or dizziness or blurred vision.  No chest pain or palpitations.  No dysuria, oliguria or hematuria or flank pain.  Upon arrival of EMS the patient was hypoxic requiring 3 L of O2 by nasal cannula that was later increased to 6 L via nonrebreather.  He denied any orthopnea or paroxysmal nocturnal dyspnea or worsening lower extremity edema.  He was recently admitted here on 3/27 and discharged on 3/29 for acute metabolic encephalopathy of unclear etiology could be related to polypharmacy and progressive dementia with behavioral changes and potential medication side effect from Wellbutrin.  At that time his Wellbutrin and Seroquel were discontinued and he was started on Risperdal and continued on Remeron.  Upon presentation to the emergency room, respiratory rate was 25 and pulse ox 90 was 97% on her percent nonrebreather and later 98% on 8 L of O2 via high flow nasal cannula.  Labs were remarkable for a BNP of 130 and procalcitonin of 0.32 with mild anemia on CBC and high-sensitivity troponin I of 1120s.  CMP was unremarkable.  Covid 19 rapid PCR test is currently pending.  Twelve-lead EKG showed accelerated junctional rhythm with a rate of 86.  Portable chest x-ray showed  bilateral lower lobe opacities, left greater than right concerning for pneumonia and mild cardiomegaly.  The patient was given IV Zosyn, vancomycin and doxycycline.  He will be admitted to the medical monitored bed for further evaluation and management. PAST MEDICAL HISTORY:   Past Medical History:  Diagnosis Date  . Anxiety   . Cancer ([place])    skin  . COPD (chronic obstructive pulmonary disease) ([place])   . Glaucoma   . Osteoporosis    osteopenia    PAST SURGICAL HISTORY:   Past Surgical History:  Procedure Laterality Date  . CHOLECYSTECTOMY    . TRANSURETHRAL RESECTION OF PROSTATE N/A 2008    SOCIAL HISTORY:   Social History   Tobacco Use  . Smoking status: Former Smoker    Years: 15.00  . Smokeless tobacco: Current User    Types: Chew  . Tobacco comment: stopped 15 years ago  Substance Use Topics  . Alcohol use: Yes    Comment: past    FAMILY HISTORY:   Family History  Problem Relation Age of Onset  . Depression Mother   . Heart disease Father     DRUG ALLERGIES:   Allergies  Allergen Reactions  . Azithromycin Shortness Of Breath    REVIEW OF SYSTEMS:   ROS As per history of present illness. All pertinent systems were reviewed above. Constitutional,  HEENT, cardiovascular, respiratory, GI, GU, musculoskeletal, neuro, psychiatric, endocrine,  integumentary and hematologic systems were reviewed and are otherwise  negative/unremarkable except for positive findings mentioned above in  the HPI.   MEDICATIONS AT HOME:   Prior to Admission medications   Medication Sig Start Date End Date Taking? Authorizing Provider  albuterol (VENTOLIN HFA) 108 (90 Base) MCG/ACT inhaler INHALE 1-2 PUFFS INTO THE LUNGS EVERY 6 HOURS AS NEEDED 09/22/19   [place] Ranger, Devonne Doughty, DO  amLODipine (NORVASC) 10 MG tablet Take 10 mg by mouth daily.    [provider]  aspirin EC 81 MG tablet Take 81 mg by mouth daily.    [provider]  bacitracin ophthalmic  ointment 1 application as needed. Apply to affected eye 08/01/19   [provider]  diclofenac sodium (VOLTAREN) 1 % GEL Apply 1 application topically as needed. 03/28/19   [provider]  DULoxetine (CYMBALTA) 60 MG capsule Take 1 capsule (60 mg total) by mouth daily. 06/17/19   Karamalegos, Devonne Doughty, DO  fenofibrate (TRICOR) 145 MG tablet Take 145 mg by mouth daily.    [provider]  ferrous sulfate 325 (65 FE) MG tablet Take 325 mg by mouth daily with breakfast.    [provider]  meloxicam (MOBIC) 15 MG tablet Take 1 tablet (15 mg total) by mouth daily. 04/07/19 10/04/19  Milinda Pointer, MD  Multiple Vitamin (MULTIVITAMIN WITH MINERALS) TABS tablet Take 1 tablet by mouth daily.    [provider]  Oxycodone HCl 10 MG TABS Take 1 tablet (10 mg total) by mouth 3 (three) times daily as needed. Must last 30 days. 07/26/19 08/25/19  Milinda Pointer, MD  Oxycodone HCl 10 MG TABS Take 1 tablet (10 mg total) by mouth 3 (three) times daily as needed. Must last 30 days. 08/25/19 09/24/19  Milinda Pointer, MD  Oxycodone HCl 10 MG TABS Take 1 tablet (10 mg total) by mouth 3 (three) times daily as needed. Must last 30 days. 09/24/19 10/24/19  Milinda Pointer, MD  pantoprazole (PROTONIX) 40 MG tablet Take 40 mg by mouth daily.     [provider]  prednisoLONE acetate (PRED FORTE) 1 % ophthalmic suspension Place 2 drops into the left eye daily. 02/18/19   [name], MD  pregabalin (LYRICA) 150 MG capsule Take 1 capsule (150 mg total) by mouth 3 (three) times daily. Must last 30 days Patient not taking: Reported on 09/05/2019 04/07/19 10/04/19  Milinda Pointer, MD  risperiDONE (RISPERDAL) 0.5 MG tablet Take 1 tablet (0.5 mg total) by mouth 2 (two) times daily. 09/15/19 10/15/19  Sidney Ace, MD  timolol (TIMOPTIC) 0.5 % ophthalmic solution 1 drop 2 (two) times daily. Apply one drop to eye(s) 03/12/19   [provider]  traZODone  (DESYREL) 50 MG tablet TAKE 1 TABLET BY MOUTH AT BEDTIME 08/28/19   Karamalegos, Alexander J, DO  umeclidinium-vilanterol (ANORO ELLIPTA) 62.5-25 MCG/INH AEPB Inhale 1 puff into the lungs daily.    [provider]      VITAL SIGNS:  Blood pressure 134/67, pulse 93, temperature 98 F (36.7 C), temperature source Oral, resp. rate (!) 25, height 5\' 11"  (1.803 m), weight 108 kg, SpO2 95 %.  PHYSICAL EXAMINATION:  Physical Exam  GENERAL:  85 y.o.-year-old obese Caucasian male patient lying in the bed with mild respiratory distress with conversational dyspnea. EYES: Pupils equal, round, reactive to light and accommodation. No scleral icterus. Extraocular muscles intact.  HEENT: Head atraumatic, normocephalic. Oropharynx and nasopharynx clear.  NECK:  Supple, no jugular venous distention. No thyroid enlargement, no tenderness.  LUNGS: Diminished bibasal breath sounds with bibasal and midlung zone crackles. CARDIOVASCULAR: Regular rate and rhythm,  S1, S2 normal. No murmurs, rubs, or gallops.  ABDOMEN: Soft, nondistended, nontender. Bowel sounds present. No organomegaly or mass.  EXTREMITIES: No pedal edema, cyanosis, or clubbing.  NEUROLOGIC: Cranial nerves II through XII are intact. Muscle strength 5/5 in all extremities. Sensation intact. Gait not checked.  PSYCHIATRIC: The patient is alert and oriented x 3.  Normal affect and good eye contact. SKIN: No obvious rash, lesion, or ulcer.   LABORATORY PANEL:   CBC Recent Labs  Lab 09/25/19 1858  WBC 9.9  HGB 12.9*  HCT 39.8  PLT 185   ------------------------------------------------------------------------------------------------------------------  Chemistries  Recent Labs  Lab 09/25/19 1858  NA 138  K 3.9  CL 104  CO2 27  GLUCOSE 125*  BUN 14  CREATININE 1.19  CALCIUM 9.3  AST 32  ALT 27  ALKPHOS 45  BILITOT 0.8    ------------------------------------------------------------------------------------------------------------------  Cardiac Enzymes No results for input(s): TROPONINI in the last 168 hours. ------------------------------------------------------------------------------------------------------------------  RADIOLOGY:  DG Chest Portable 1 View  Result Date: 09/25/2019 CLINICAL DATA:  Shortness of breath EXAM: PORTABLE CHEST 1 VIEW COMPARISON:  09/13/2019 FINDINGS: Low lung volumes. Bilateral lower lobe airspace opacities, left greater than right. Mild cardiomegaly. No effusions. No acute bony abnormality. IMPRESSION: Bilateral lower lobe opacities, left greater than right concerning for pneumonia. Mild cardiomegaly. Electronically Signed   By: Rolm Baptise M.D.   On: 09/25/2019 18:37      IMPRESSION AND PLAN:   1.  Aspiration pneumonia with subsequent acute hypoxic respiratory failure. -The patient will be admitted to a medical monitored bed. -We will continue antibiotic therapy with IV Zosyn and vancomycin. -We will place him on as needed and scheduled duo nebs. -Mucolytic therapy will be provided. -Sputum Gram stain culture and sensitivity as well as pneumonia antigens were added. -We will follow blood cultures. -Speech therapy consult will be obtained. -O2 protocol will be followed.  2.  COPD.  He has no current exacerbation. -He will be placed on scheduled and as needed duo nebs as mentioned above.  3.  Essential hypertension. -Norvasc will be continued.  4.  Major depression.   -His Cymbalta, trazodone and Risperdal will be continued.  5.  Glaucoma. -His ophthalmic GTT will be continued.  6.  DVT prophylaxis. -Subcutaneous Lovenox  All the records are reviewed and case discussed with ED provider. The plan of care was discussed in details with the patient and his daughter and next of kin Ms. Alexey Starwalt over the phone at [phone]. I answered all questions. The  patient and his daughter agreed to proceed with the above mentioned plan. Further management will depend upon hospital course.   CODE STATUS: This was discussed with the patient in person and the daughter over the phone and they clearly indicated that the patient is full code.  TOTAL TIME TAKING CARE OF THIS PATIENT: 55 minutes.    Christel Mormon M.D on 09/25/2019 at 8:14 PM  [facility]   From 7 PM-7 AM, contact night-coverage [email or web address]  CC: Primary care physician; Olin Hauser, DO   Note: This dictation was prepared with Dragon dictation along with smaller phrase technology. Any transcriptional errors that result from this process are unintentional.

## 2019-09-25 NOTE — ED Notes (Signed)
Respiratory at bedside.

## 2019-09-25 NOTE — ED Provider Notes (Signed)
[facility] Emergency Department Provider Note  ____________________________________________   First MD Initiated Contact with Patient 09/25/19 1815     (approximate)  I have reviewed the triage vital signs and the nursing notes.   HISTORY  Chief Complaint Shortness of Breath    HPI Aaron Gibbs is a 85 y.o. male with COPD, anxiety who comes in with concerns for shortness of breath.  Patient reports choking on a piece of pork yesterday and had choking on some milk this morning.  On EMS arrival patient was satting in the 80s on 3 L that was placed by fire.  They moved patient up to 6 L and he was still hypoxic so patient was placed on a nonrebreather satting in the 90s.  Patient reports feeling short of breath, severe, constant, nothing makes it better, nothing makes it worse.  Denies having significant COPD flares.          Past Medical History:  Diagnosis Date  . Anxiety   . Cancer ([place])    skin  . COPD (chronic obstructive pulmonary disease) ([place])   . Glaucoma   . Osteoporosis    osteopenia    Patient Active Problem List   Diagnosis Date Noted  . COPD (chronic obstructive pulmonary disease) ([place])   . HLD (hyperlipidemia)   . Iron deficiency anemia   . Fall   . Acute postoperative pain 06/10/2019  . Medial meniscal tear, sequela (Left) 06/10/2019  . Patellar tendinosis (Right) 06/10/2019  . Lateral meniscal tear, sequela (Left) 03/03/2019  . Palpitation 02/13/2019  . Preop testing 11/14/2018  . Noncompliance with medication treatment due to overuse of medication 10/23/2018  . Neurogenic pain 08/20/2018  . Atherosclerotic peripheral vascular disease ([place]) 07/24/2018  . Tricompartment osteoarthritis of knee (Left) 07/24/2018  . Osteoarthritis of knee (Bilateral) 07/24/2018  . Osteoarthritis of patellofemoral joint (Right) 07/24/2018  . History of suicide attempt (06/12/18) 07/24/2018  . Long term current use of opiate analgesic  07/10/2018  . Pharmacologic therapy 07/10/2018  . Disorder of skeletal system 07/10/2018  . Problems influencing health status 07/10/2018  . Suicide attempt ([place]) (05/13/18) 06/26/2018  . Acute metabolic encephalopathy [id number]  . 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 Major depressive disorder, recurrent episode, severe ([place]) 06/04/2018  . Blindness 05/10/2018  . Suicidal ideation 05/10/2018  . Chronic pain syndrome 05/01/2018  . DISH (diffuse idiopathic skeletal hyperostosis) 05/01/2018  . Osteoarthritis of multiple joints 05/01/2018  . Chronic knee pain (Primary Area of Pain) (Bilateral) (L>R) 05/01/2018  . Retinitis pigmentosa of both eyes 05/01/2018  . Glaucoma of both eyes 05/01/2018  . Essential hypertension 05/01/2018  . Chronic low back pain (Bilateral) w/o sciatica 05/01/2018  . GERD (gastroesophageal reflux disease) 05/01/2018  . 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 Therapeutic opioid-induced constipation (OIC) 05/01/2018  . Centrilobular emphysema ([place]) 05/01/2018    Past Surgical History:  Procedure Laterality Date  . CHOLECYSTECTOMY    . TRANSURETHRAL RESECTION OF PROSTATE N/A 2008    Prior to Admission medications   Medication Sig Start Date End Date Taking? Authorizing Provider  albuterol (VENTOLIN HFA) 108 (90 Base) MCG/ACT inhaler INHALE 1-2 PUFFS INTO THE LUNGS EVERY 6 HOURS AS NEEDED 09/22/19   Parks Ranger, Devonne Doughty, DO  amLODipine (NORVASC) 10 MG tablet Take 10 mg by mouth daily.    [provider]  aspirin EC 81 MG tablet Take 81 mg by mouth daily.    [provider]  bacitracin ophthalmic ointment 1 application as needed.  Apply to affected eye 08/01/19   [provider]  diclofenac sodium (VOLTAREN) 1 % GEL Apply 1 application topically as needed. 03/28/19   [provider]  DULoxetine (CYMBALTA) 60 MG capsule Take 1 capsule (60 mg total) by mouth daily. 06/17/19   Karamalegos, Devonne Doughty, DO   fenofibrate (TRICOR) 145 MG tablet Take 145 mg by mouth daily.    [provider]  ferrous sulfate 325 (65 FE) MG tablet Take 325 mg by mouth daily with breakfast.    [provider]  meloxicam (MOBIC) 15 MG tablet Take 1 tablet (15 mg total) by mouth daily. 04/07/19 10/04/19  Milinda Pointer, MD  Multiple Vitamin (MULTIVITAMIN WITH MINERALS) TABS tablet Take 1 tablet by mouth daily.    [provider]  Oxycodone HCl 10 MG TABS Take 1 tablet (10 mg total) by mouth 3 (three) times daily as needed. Must last 30 days. 07/26/19 08/25/19  Milinda Pointer, MD  Oxycodone HCl 10 MG TABS Take 1 tablet (10 mg total) by mouth 3 (three) times daily as needed. Must last 30 days. 08/25/19 09/24/19  Milinda Pointer, MD  Oxycodone HCl 10 MG TABS Take 1 tablet (10 mg total) by mouth 3 (three) times daily as needed. Must last 30 days. 09/24/19 10/24/19  Milinda Pointer, MD  pantoprazole (PROTONIX) 40 MG tablet Take 40 mg by mouth daily.     [provider]  prednisoLONE acetate (PRED FORTE) 1 % ophthalmic suspension Place 2 drops into the left eye daily. 02/18/19   [name], MD  pregabalin (LYRICA) 150 MG capsule Take 1 capsule (150 mg total) by mouth 3 (three) times daily. Must last 30 days Patient not taking: Reported on 09/05/2019 04/07/19 10/04/19  Milinda Pointer, MD  risperiDONE (RISPERDAL) 0.5 MG tablet Take 1 tablet (0.5 mg total) by mouth 2 (two) times daily. 09/15/19 10/15/19  Sidney Ace, MD  timolol (TIMOPTIC) 0.5 % ophthalmic solution 1 drop 2 (two) times daily. Apply one drop to eye(s) 03/12/19   [provider]  traZODone (DESYREL) 50 MG tablet TAKE 1 TABLET BY MOUTH AT BEDTIME 08/28/19   Karamalegos, Alexander J, DO  umeclidinium-vilanterol (ANORO ELLIPTA) 62.5-25 MCG/INH AEPB Inhale 1 puff into the lungs daily.    [provider]    Allergies Azithromycin  Family History  Problem Relation Age of Onset  . Depression Mother   .  Heart disease Father     Social History Social History   Tobacco Use  . Smoking status: Former Smoker    Years: 15.00  . Smokeless tobacco: Current User    Types: Chew  . Tobacco comment: stopped 15 years ago  Substance Use Topics  . Alcohol use: Yes    Comment: past  . Drug use: Never      Review of Systems Constitutional: No fever/chills Eyes: No visual changes. ENT: No sore throat. Cardiovascular: No chest pain Respiratory: Positive for SOB Gastrointestinal: No abdominal pain.  No nausea, no vomiting.  No diarrhea.  No constipation. Genitourinary: Negative for dysuria. Musculoskeletal: Negative for back pain. Skin: Negative for rash. Neurological: Negative for headaches, focal weakness or numbness. All other ROS negative ____________________________________________   PHYSICAL EXAM:  VITAL SIGNS: ED Triage Vitals  Blood pressure 134/67, pulse 93, temperature 98 F (36.7 C), temperature source Oral, resp. rate (!) 25, height 5\' 11"  (1.803 m), weight 108 kg, SpO2 97 %.   Constitutional: Alert and oriented. Increased WOB  Eyes: Conjunctivae are normal. EOMI. Head: Atraumatic. Nose: No congestion/rhinnorhea.  Mouth/Throat: Mucous membranes are moist.   Neck: No stridor. Trachea Midline. FROM Cardiovascular: Normal rate, regular rhythm. Grossly normal heart sounds.  Good peripheral circulation. Respiratory: No wheezing but increased work of breathing. Gastrointestinal: Soft and nontender. No distention. No abdominal bruits.  Musculoskeletal: No lower extremity tenderness nor edema.  No joint effusions. Neurologic:  Normal speech and language. No gross focal neurologic deficits are appreciated.  Skin:  Skin is warm, dry and intact. No rash noted. Psychiatric: Mood and affect are normal. Speech and behavior are normal. GU: Deferred   ____________________________________________   LABS (all labs ordered are listed, but only abnormal results are displayed)  Labs  Reviewed  CBC WITH DIFFERENTIAL/PLATELET - Abnormal; Notable for the following components:      Result Value   Hemoglobin 12.9 (*)    RDW 15.6 (*)    Monocytes Absolute 2.2 (*)    All other components within normal limits  COMPREHENSIVE METABOLIC PANEL - Abnormal; Notable for the following components:   Glucose, Bld 125 (*)    GFR calc non Af Amer 57 (*)    All other components within normal limits  BRAIN NATRIURETIC PEPTIDE - Abnormal; Notable for the following components:   B Natriuretic Peptide 130.0 (*)    All other components within normal limits  RESPIRATORY PANEL BY RT PCR (FLU A&B, COVID)  CULTURE, BLOOD (ROUTINE X 2)  CULTURE, BLOOD (ROUTINE X 2)  URINE CULTURE  PROCALCITONIN  PROCALCITONIN  LACTIC ACID, PLASMA  LACTIC ACID, PLASMA  TROPONIN I (HIGH SENSITIVITY)  TROPONIN I (HIGH SENSITIVITY)   ____________________________________________   ED ECG REPORT I, Vanessa [place], the attending physician, personally viewed and interpreted this ECG.  EKG looks sinus rate of 86, no ST elevation, T wave inversion in lead III wandering baseline due to patient's movement, normal intervals ____________________________________________  RADIOLOGY Robert Bellow, personally viewed and evaluated these images (plain radiographs) as part of my medical decision making, as well as reviewing the written report by the radiologist.  ED MD interpretation: Concern for bilateral pneumonia  Official radiology report(s): DG Chest Portable 1 View  Result Date: 09/25/2019 CLINICAL DATA:  Shortness of breath EXAM: PORTABLE CHEST 1 VIEW COMPARISON:  09/13/2019 FINDINGS: Low lung volumes. Bilateral lower lobe airspace opacities, left greater than right. Mild cardiomegaly. No effusions. No acute bony abnormality. IMPRESSION: Bilateral lower lobe opacities, left greater than right concerning for pneumonia. Mild cardiomegaly. Electronically Signed   By: Rolm Baptise M.D.   On: 09/25/2019 18:37     ____________________________________________   PROCEDURES  Procedure(s) performed (including Critical Care):  .Critical Care Performed by: Vanessa [place], MD Authorized by: Vanessa [place], MD   Critical care provider statement:    Critical care time (minutes):  40   Critical care was necessary to treat or prevent imminent or life-threatening deterioration of the following conditions:  Respiratory failure   Critical care was time spent personally by me on the following activities:  Discussions with consultants, evaluation of patient's response to treatment, examination of patient, ordering and performing treatments and interventions, ordering and review of laboratory studies, ordering and review of radiographic studies, pulse oximetry, re-evaluation of patient's condition, obtaining history from patient or surrogate and review of old charts     ____________________________________________   INITIAL IMPRESSION / ASSESSMENT AND PLAN / ED COURSE   Aaron Gibbs was evaluated in Emergency Department on 09/25/2019 for the symptoms described in the history of present illness. He was evaluated in the context of  the global COVID-19 pandemic, which necessitated consideration that the patient might be at risk for infection with the SARS-CoV-2 virus that causes COVID-19. Institutional protocols and algorithms that pertain to the evaluation of patients at risk for COVID-19 are in a state of rapid change based on information released by regulatory bodies including the CDC and federal and state organizations. These policies and algorithms were followed during the patient's care in the ED.     Pt presents with SOB. Differential includes: PNA-will get xray to evaluation Anemia-CBC to evaluate ACS- will get trops Arrhythmia-Will get EKG and keep on monitor.  COVID- will get testing per algorithm. PE-lower suspicion given no risk factors and other cause more likely    Patient placed on 8 L of  oxygen  Procalcitonin is elevated consistent with my concern for pneumonia.  Chest x-ray concerning for opacities in the bilateral bases.  Will cover with broad-spectrum antibiotics at this time.  Will cover with pain given his history of COPD, Zosyn to cover anaerobes and patient has an allergy to azithromycin will so will cover with Doxy to cover atypicals.  We will discuss with the hospital team for admission for his new acute hypoxic respiratory failure   ____________________________________________   FINAL CLINICAL IMPRESSION(S) / ED DIAGNOSES   Final diagnoses:  Community acquired pneumonia, unspecified laterality  Acute respiratory failure with hypoxia ([place])     MEDICATIONS GIVEN DURING THIS VISIT:  Medications  vancomycin (VANCOCIN) IVPB 1000 mg/200 mL premix (has no administration in time range)  doxycycline (VIBRAMYCIN) 100 mg in sodium chloride 0.9 % 250 mL IVPB (has no administration in time range)  piperacillin-tazobactam (ZOSYN) IVPB 3.375 g (has no administration in time range)     ED Discharge Orders    None       Note:  This document was prepared using Dragon voice recognition software and may include unintentional dictation errors.   Vanessa [place], MD 09/25/19 2007

## 2019-09-26 ENCOUNTER — Other Ambulatory Visit: Payer: Self-pay

## 2019-09-26 DIAGNOSIS — J189 Pneumonia, unspecified organism: Secondary | ICD-10-CM

## 2019-09-26 LAB — CBC
HCT: 35.4 % — ABNORMAL LOW (ref 39.0–52.0)
Hemoglobin: 11 g/dL — ABNORMAL LOW (ref 13.0–17.0)
MCH: 28.6 pg (ref 26.0–34.0)
MCHC: 31.1 g/dL (ref 30.0–36.0)
MCV: 91.9 fL (ref 80.0–100.0)
Platelets: 144 10*3/uL — ABNORMAL LOW (ref 150–400)
RBC: 3.85 MIL/uL — ABNORMAL LOW (ref 4.22–5.81)
RDW: 15.4 % (ref 11.5–15.5)
WBC: 8.9 10*3/uL (ref 4.0–10.5)
nRBC: 0 % (ref 0.0–0.2)

## 2019-09-26 LAB — BASIC METABOLIC PANEL
Anion gap: 7 (ref 5–15)
BUN: 15 mg/dL (ref 8–23)
CO2: 24 mmol/L (ref 22–32)
Calcium: 8.6 mg/dL — ABNORMAL LOW (ref 8.9–10.3)
Chloride: 108 mmol/L (ref 98–111)
Creatinine, Ser: 1.09 mg/dL (ref 0.61–1.24)
GFR calc Af Amer: >60 mL/min (ref >60)
GFR calc non Af Amer: >60 mL/min (ref >60)
Glucose, Bld: 115 mg/dL — ABNORMAL HIGH (ref 70–99)
Potassium: 3.6 mmol/L (ref 3.5–5.1)
Sodium: 139 mmol/L (ref 135–145)

## 2019-09-26 LAB — STREP PNEUMONIAE URINARY ANTIGEN: Strep Pneumo Urinary Antigen: NEGATIVE

## 2019-09-26 LAB — HIV ANTIBODY (ROUTINE TESTING W REFLEX): HIV Screen 4th Generation wRfx: NONREACTIVE

## 2019-09-26 LAB — PROCALCITONIN: Procalcitonin: 0.35 ng/mL

## 2019-09-26 LAB — LACTIC ACID, PLASMA: Lactic Acid, Venous: 1.1 mmol/L (ref 0.5–1.9)

## 2019-09-26 MED ORDER — SODIUM CHLORIDE 0.9 % IV SOLN: 3.0000 g | Freq: Once | INTRAVENOUS | Status: DC

## 2019-09-26 MED ORDER — LORAZEPAM 2 MG/ML IJ SOLN
0.5000 mg | Freq: Once | INTRAMUSCULAR | Status: AC
  Administered 2019-09-26: 0.5 mg via INTRAVENOUS
  Filled 2019-09-26: qty 1

## 2019-09-26 MED ORDER — SODIUM CHLORIDE 0.9 % IV SOLN
3.0000 g | Freq: Four times a day (QID) | INTRAVENOUS | Status: DC
  Administered 2019-09-26 – 2019-09-27 (×6): 3 g via INTRAVENOUS
  Filled 2019-09-26 (×3): qty 8
  Filled 2019-09-26 (×2): qty 3
  Filled 2019-09-26: qty 8
  Filled 2019-09-26 (×2): qty 3
  Filled 2019-09-26: qty 8

## 2019-09-26 MED ORDER — ALPRAZOLAM 0.25 MG PO TABS
0.2500 mg | ORAL_TABLET | Freq: Two times a day (BID) | ORAL | Status: DC | PRN
  Administered 2019-09-27: 02:00:00 0.25 mg via ORAL
  Filled 2019-09-26: qty 1

## 2019-09-26 NOTE — Evaluation (Signed)
Physical Therapy Evaluation Patient Details Name: Aaron Gibbs MRN: [id number] DOB: September 16, 1934 Today's Date: 09/26/2019   History of Present Illness  Pt is an 85 yo male that presented to ED  for concerns of SOB. Also reported choking on food /drink. PMH of anxiety, skin cancer, COPD, glaucoma, depression, HTN, HLD, chronic knee pain, and recent hospital visit for fall and confusion.    Clinical Impression  Pt alert, sitting EOB, oriented x4. Denied pain. Stated at home he lives alone, has an aide that comes for 5 hrs a day to assist with errands/cooking/cleaning, though pt can cook. modI for ADLs, and recently transitioned to using a walker for ambulation due to increased falls (5 in the last 6 months).   The patient demonstrated sit <> stand with RW and CGA. Pt needed to gather momentum/several tries to stand, reliant on UE but did not need physical assist from PT. The patient was able to ambulate ~42ft total on room air (with RN consent). Did not ambulate in hallway due to patient unable to tolerate a mask.  Utilized RW and CGA, exhibited decreased step height/length, no unsteadiness noted, but decreased gait velocity. Ambulates with RW outside base of support. Pt returned to supine modI, pt assisted with items due to low vision (bed rails, call bells, table, etc).  Overall the patient demonstrated deficits (see "PT Problem List") that impede the patient's functional abilities, safety, and mobility and would benefit from skilled PT intervention. Recommendation is HHPT with intermittent supervision.      Follow Up Recommendations Home health PT;Supervision - Intermittent    Equipment Recommendations  None recommended by PT(Pt has RW and SPC)    Recommendations for Other Services       Precautions / Restrictions Precautions Precautions: Fall Precaution Comments: aspiration Restrictions Weight Bearing Restrictions: No      Mobility  Bed Mobility Overal bed mobility: Needs  Assistance Bed Mobility: Sit to Sidelying;Sit to Supine   Sidelying to sit: Modified independent (Device/Increase time)   Sit to supine: Modified independent (Device/Increase time) Sit to sidelying: Supervision;HOB elevated(Increased time)    Transfers Overall transfer level: Needs assistance Equipment used: Rolling walker (2 wheeled) Transfers: Sit to/from Stand Sit to Stand: Min guard         General transfer comment: low vision, pt needed several attempts, but able to complete without physical assistance  Ambulation/Gait Ambulation/Gait assistance: Min assist Gait Distance (Feet): 50 Feet Assistive device: Rolling walker (2 wheeled)       General Gait Details: decreased step height/length, no unsteadiness noted, but decreased gait velocity. Ambulates with RW outside base of support.  Stairs            Wheelchair Mobility    Modified Rankin (Stroke Patients Only)       Balance Overall balance assessment: Needs assistance Sitting-balance support: Feet supported Sitting balance-Leahy Scale: Normal     Standing balance support: During functional activity Standing balance-Leahy Scale: Fair                               Pertinent Vitals/Pain Pain Assessment: No/denies pain    Home Living Family/patient expects to be discharged to:: Private residence Living Arrangements: Alone Available Help at Discharge: Home health;Available PRN/intermittently;Personal care attendant(aide comes for 5 hrs every day) Type of Home: Apartment Home Access: Stairs to enter Entrance Stairs-Rails: Right;Left;Can reach both Entrance Stairs-Number of Steps: 2 Home Layout: One level Home Equipment: Walker - 2 wheels;Cane -  single point;Transport chair;Hand held shower head;Grab bars - toilet;Shower seat;Grab bars - tub/shower      Prior Function Level of Independence: Needs assistance   Gait / Transfers Assistance Needed: Pt reports using a SPC for functional  mobility initially, but has transitioned to RW recently due to pt reported 5-6 falls.  ADL's / Homemaking Assistance Needed: Pt reports aids assist with meals and supervise mobility        Hand Dominance   Dominant Hand: Right    Extremity/Trunk Assessment   Upper Extremity Assessment Upper Extremity Assessment: Generalized weakness    Lower Extremity Assessment Lower Extremity Assessment: Generalized weakness    Cervical / Trunk Assessment Cervical / Trunk Assessment: Kyphotic  Communication   Communication: No difficulties;Other (comment)(visual impairment)  Cognition Arousal/Alertness: Awake/alert Behavior During Therapy: WFL for tasks assessed/performed Overall Cognitive Status: Within Functional Limits for tasks assessed                                 General Comments: Pt A&O x4      General Comments General comments (skin integrity, edema, etc.): pt on room air throughout session. lowest reading 89% upon sidelying after ambulation, returned quickly to 93%    Exercises Other Exercises Other Exercises: Pt educated on continued mobiliy, use of RW and benefit of HHPT Other Exercises: Pt assisted to sidelying at end of session   Assessment/Plan    PT Assessment Patient needs continued PT services  PT Problem List Decreased strength;Decreased activity tolerance;Decreased mobility;Decreased balance       PT Treatment Interventions Gait training;Therapeutic activities;Therapeutic exercise;Balance training;DME instruction;Patient/family education    PT Goals (Current goals can be found in the Care Plan section)  Acute Rehab PT Goals Patient Stated Goal: to get better PT Goal Formulation: With patient Time For Goal Achievement: 10/10/19 Potential to Achieve Goals: Good    Frequency Min 2X/week   Barriers to discharge        Co-evaluation               AM-PAC PT "6 Clicks" Mobility  Outcome Measure Help needed turning from your back  to your side while in a flat bed without using bedrails?: A Little Help needed moving from lying on your back to sitting on the side of a flat bed without using bedrails?: A Little Help needed moving to and from a bed to a chair (including a wheelchair)?: A Little Help needed standing up from a chair using your arms (e.g., wheelchair or bedside chair)?: A Little Help needed to walk in hospital room?: A Little Help needed climbing 3-5 steps with a railing? : A Little 6 Click Score: 18    End of Session Equipment Utilized During Treatment: Gait belt Activity Tolerance: Patient tolerated treatment well Patient left: in bed;with bed alarm set Nurse Communication: Mobility status;Other (comment)(oxygen status) PT Visit Diagnosis: Unsteadiness on feet (R26.81);Muscle weakness (generalized) (M62.81);Difficulty in walking, not elsewhere classified (R26.2)    Time: [id number] PT Time Calculation (min) (ACUTE ONLY): 31 min   Charges:   PT Evaluation $PT Eval Low Complexity: 1 Low PT Treatments $Therapeutic Exercise: 8-22 mins        Lieutenant Diego PT, DPT 4:33 PM,09/26/19

## 2019-09-26 NOTE — ED Notes (Signed)
Upon entering room pt does not have [place] on. Oxygen saturation is 94%. Oxygen turned off at this time and RN will continue to assess patient closely.

## 2019-09-26 NOTE — Sepsis Progress Note (Signed)
Notified bedside nurse of need to draw repeat lactic acid. 

## 2019-09-26 NOTE — Progress Notes (Signed)
PROGRESS NOTE    Aaron Gibbs  [id number] DOB: 01/12/1935 DOA: 09/25/2019 PCP: Olin Hauser, DO   Brief Narrative:  Aaron Gibbs  is a 85 y.o. Caucasian male with a known history of COPD, glaucoma and anxiety, who presented to the emergency room with acute onset of worsening cough which has been occasionally productive of yellowish sputum, dyspnea without wheezing.  He denies any fever or chills.  He choked on pork chops yesterday while eating and milk today.  On arrival of EMS patient was hypoxic initially requiring 3 L which worsened later and he was placed on non breather.  He was recently admitted here on 3/27 and discharged on 3/29 for acute metabolic encephalopathy of unclear etiology could be related to polypharmacy and progressive dementia with behavioral changes and potential medication side effect from Wellbutrin.  At that time his Wellbutrin and Seroquel were discontinued and he was started on Risperdal and continued on Remeron.  On arrival procalcitonin was 0.32, mild leukocytosis, x-ray with bilateral lower lobe opacities left greater than right concerning for aspiration pneumonia.  Patient was initially started on IV Zosyn, vancomycin and doxycycline according to sepsis protocol.  Subjective: Patient was sleeping comfortably when seen today.  Easily arousable.  Stating that his shortness of breath is improving, continue to have some cough.  He was saturating well with nasal cannula on his beard.  Assessment & Plan:   Active Problems:   Aspiration pneumonia (HCC)  Acute hypoxic respiratory failure secondary to aspiration pneumonia. Clinically seems improving.  Pneumo antigen negative, Legionella pending. Blood cultures pending. Discontinue IV Zosyn and vancomycin. -Start him on on Unasyn -Get swallow evaluation. -Try weaning him off from oxygen. -PT/OT evaluation for discharge needs.  COPD.  No acute exacerbation, no wheezing. -Continue as needed  DuoNeb.  Essential hypertension.  -Continue home dose of Norvasc.  Major depression.   -Continue home dose of Cymbalta, trazodone and Risperdal  Glaucoma. -Continue home meds.  Objective: Vitals:   09/26/19 1100 09/26/19 1130 09/26/19 1200 09/26/19 1305  BP: (!) 142/86 124/85 140/70 126/70  Pulse: 69 71 69 73  Resp: (!) 23 (!) 26 18 18   Temp:    97.7 F (36.5 C)  TempSrc:      SpO2: 91% 99% 98% 96%  Weight:      Height:        Intake/Output Summary (Last 24 hours) at 09/26/2019 1423 Last data filed at 09/26/2019 1114 Gross per 24 hour  Intake 946.66 ml  Output 300 ml  Net 646.66 ml   Filed Weights   09/25/19 1822  Weight: 108 kg    Examination:  General exam: Obese elderly gentleman, appears calm and comfortable  Respiratory system: Clear to auscultation. Respiratory effort normal. Cardiovascular system: S1 & S2 heard, RRR. No JVD, murmurs, rubs, gallops or clicks. Gastrointestinal system: Soft, nontender, nondistended, bowel sounds positive. Central nervous system: Alert and oriented. No focal neurological deficits.Symmetric 5 x 5 power. Extremities: No edema, no cyanosis, pulses intact and symmetrical. Psychiatry: Judgement and insight appear normal.     DVT prophylaxis: Lovenox Code Status: Full Family Communication: Discussed with family Disposition Plan: Pending improvement, PT and/OT evaluation.  He wants to go back home.  Consultants:   None  Procedures:  Antimicrobials:  Unasyn  Data Reviewed: I have personally reviewed following labs and imaging studies  CBC: Recent Labs  Lab 09/25/19 1858 09/26/19 0310  WBC 9.9 8.9  NEUTROABS 6.1  --   HGB 12.9* 11.0*  HCT 39.8 35.4*  MCV 88.6 91.9  PLT 185 [id number]*   Basic Metabolic Panel: Recent Labs  Lab 09/25/19 1858 09/26/19 0310  NA 138 139  K 3.9 3.6  CL 104 108  CO2 27 24  GLUCOSE 125* 115*  BUN 14 15  CREATININE 1.19 1.09  CALCIUM 9.3 8.6*   GFR: Estimated Creatinine Clearance: 65.3  mL/min (by C-G formula based on SCr of 1.09 mg/dL). Liver Function Tests: Recent Labs  Lab 09/25/19 1858  AST 32  ALT 27  ALKPHOS 45  BILITOT 0.8  PROT 6.6  ALBUMIN 3.5   No results for input(s): LIPASE, AMYLASE in the last 168 hours. No results for input(s): AMMONIA in the last 168 hours. Coagulation Profile: No results for input(s): INR, PROTIME in the last 168 hours. Cardiac Enzymes: No results for input(s): CKTOTAL, CKMB, CKMBINDEX, TROPONINI in the last 168 hours. BNP (last 3 results) No results for input(s): PROBNP in the last 8760 hours. HbA1C: No results for input(s): HGBA1C in the last 72 hours. CBG: No results for input(s): GLUCAP in the last 168 hours. Lipid Profile: No results for input(s): CHOL, HDL, LDLCALC, TRIG, CHOLHDL, LDLDIRECT in the last 72 hours. Thyroid Function Tests: No results for input(s): TSH, T4TOTAL, FREET4, T3FREE, THYROIDAB in the last 72 hours. Anemia Panel: No results for input(s): VITAMINB12, FOLATE, FERRITIN, TIBC, IRON, RETICCTPCT in the last 72 hours. Sepsis Labs: Recent Labs  Lab 09/25/19 1858 09/25/19 2235 09/26/19 0310  PROCALCITON 0.32  --  0.35  LATICACIDVEN  --  2.0* 1.1    Recent Results (from the past 240 hour(s))  Respiratory Panel by RT PCR (Flu A&B, Covid) - Nasopharyngeal Swab     Status: None   Collection Time: 09/25/19  8:12 PM   Specimen: Nasopharyngeal Swab  Result Value Ref Range Status   SARS Coronavirus 2 by RT PCR NEGATIVE NEGATIVE Final    Comment: (NOTE) SARS-CoV-2 target nucleic acids are NOT DETECTED. The SARS-CoV-2 RNA is generally detectable in upper respiratoy specimens during the acute phase of infection. The lowest concentration of SARS-CoV-2 viral copies this assay can detect is 131 copies/mL. A negative result does not preclude SARS-Cov-2 infection and should not be used as the sole basis for treatment or other patient management decisions. A negative result may occur with  improper specimen  collection/handling, submission of specimen other than nasopharyngeal swab, presence of viral mutation(s) within the areas targeted by this assay, and inadequate number of viral copies (<131 copies/mL). A negative result must be combined with clinical observations, patient history, and epidemiological information. The expected result is Negative. Fact Sheet for Patients:  PinkCheek.be Fact Sheet for Healthcare Providers:  GravelBags.it This test is not yet ap proved or cleared by the [place] FDA and  has been authorized for detection and/or diagnosis of SARS-CoV-2 by FDA under an Emergency Use Authorization (EUA). This EUA will remain  in effect (meaning this test can be used) for the duration of the COVID-19 declaration under Section 564(b)(1) of the Act, 21 U.S.C. section 360bbb-3(b)(1), unless the authorization is terminated or revoked sooner.    Influenza A by PCR NEGATIVE NEGATIVE Final   Influenza B by PCR NEGATIVE NEGATIVE Final    Comment: (NOTE) The Xpert Xpress SARS-CoV-2/FLU/RSV assay is intended as an aid in  the diagnosis of influenza from Nasopharyngeal swab specimens and  should not be used as a sole basis for treatment. Nasal washings and  aspirates are unacceptable for Xpert Xpress SARS-CoV-2/FLU/RSV  testing. Fact Sheet for Patients: PinkCheek.be Fact  Sheet for Healthcare Providers: GravelBags.it This test is not yet approved or cleared by the [place] and  has been authorized for detection and/or diagnosis of SARS-CoV-2 by  FDA under an Emergency Use Authorization (EUA). This EUA will remain  in effect (meaning this test can be used) for the duration of the  Covid-19 declaration under Section 564(b)(1) of the Act, 21  U.S.C. section 360bbb-3(b)(1), unless the authorization is  terminated or revoked. Performed at [facility],  [address], [place], [place] [address]      Radiology Studies: DG Chest Portable 1 View  Result Date: 09/25/2019 CLINICAL DATA:  Shortness of breath EXAM: PORTABLE CHEST 1 VIEW COMPARISON:  09/13/2019 FINDINGS: Low lung volumes. Bilateral lower lobe airspace opacities, left greater than right. Mild cardiomegaly. No effusions. No acute bony abnormality. IMPRESSION: Bilateral lower lobe opacities, left greater than right concerning for pneumonia. Mild cardiomegaly. Electronically Signed   By: Rolm Baptise M.D.   On: 09/25/2019 18:37    Scheduled Meds: . amLODipine  10 mg Oral Daily  . aspirin EC  81 mg Oral Daily  . DULoxetine  60 mg Oral Daily  . enoxaparin (LOVENOX) injection  40 mg Subcutaneous Q24H  . fenofibrate  160 mg Oral Daily  . ferrous sulfate  325 mg Oral Q breakfast  . multivitamin with minerals  1 tablet Oral Daily  . pantoprazole  40 mg Oral Daily  . prednisoLONE acetate  2 drop Left Eye Daily  . risperiDONE  0.5 mg Oral BID  . timolol  1 drop Both Eyes BID   Continuous Infusions: . sodium chloride 100 mL/hr at 09/26/19 1337  . ampicillin-sulbactam (UNASYN) IV Stopped (09/26/19 1114)     LOS: 1 day   Time spent: 40 minutes.  Lorella Nimrod, MD [facility]  If 7PM-7AM, please contact night-coverage [email or web address]  09/26/2019, 2:23 PM   This record has been created using Systems analyst. Errors have been sought and corrected,but may not always be located. Such creation errors do not reflect on the standard of care.

## 2019-09-26 NOTE — Consult Note (Signed)
Pharmacy Antibiotic Note  Aaron Gibbs is a 85 y.o. male admitted on 09/25/2019 with pneumonia.  Pharmacy has been consulted for Unasyn dosing.  Plan: Unasyn 3 g q6H  Height: 5\' 11"  (180.3 cm) Weight: 108 kg (238 lb 1.6 oz) IBW/kg (Calculated) : 75.3  Temp (24hrs), Avg:98 F (36.7 C), Min:98 F (36.7 C), Max:98 F (36.7 C)  Recent Labs  Lab 09/25/19 1858 09/25/19 2235 09/26/19 0310  WBC 9.9  --  8.9  CREATININE 1.19  --  1.09  LATICACIDVEN  --  2.0* 1.1    Estimated Creatinine Clearance: 65.3 mL/min (by C-G formula based on SCr of 1.09 mg/dL).    Allergies  Allergen Reactions  . Azithromycin Shortness Of Breath    Antimicrobials this admission: 4/8 doxycycline x1 4/8 pip/tazo x 2 4/8 vancomycin x 2  4/9 Unasyn  Dose adjustments this admission: None  Microbiology results: 4/8 BCx: pending 4/8 UCx: pending   Thank you for allowing pharmacy to be a part of this patient's care.  Oswald Hillock, PharmD, BCPS 09/26/2019 8:15 AM

## 2019-09-26 NOTE — Evaluation (Addendum)
Clinical/Bedside Swallow Evaluation Patient Details  Name: Aaron Gibbs MRN: [id number] Date of Birth: 1934/11/06  Today's Date: 09/26/2019 Time: SLP Start Time (ACUTE ONLY): 59 SLP Stop Time (ACUTE ONLY): 1125 SLP Time Calculation (min) (ACUTE ONLY): 55 min  Past Medical History:  Past Medical History:  Diagnosis Date  . Anxiety   . Cancer ([name])    skin  . COPD (chronic obstructive pulmonary disease) ([place])   . Glaucoma   . Osteoporosis    osteopenia   Past Surgical History:  Past Surgical History:  Procedure Laterality Date  . CHOLECYSTECTOMY    . TRANSURETHRAL RESECTION OF PROSTATE N/A 2008   HPI:  Pt is a 85 y.o. male with medical history significant for Multiple medical issues including hypertension, Vision deficits, hyperlipidemia, COPD, anxiety, GERD, depression, glaucoma, iron deficiency anemia chronic pain syndrome, recent confusion and hallucination w/ a fall presenting to this ED last month. Pt was ordered for Home PT then. This admission, Patient reports choking on a piece of pork yesterday and had choking on some milk this morning. On EMS arrival patient was satting in the 80s on 3 L so pt was placed on a nonrebreather satting in the 90s. Portable chest x-ray showed bilateral lower lobe opacities, left greater than right concerning for pneumonia and mild cardiomegaly.    Assessment / Plan / Recommendation Clinical Impression  Pt appears to present w/ adequate oropharyngeal phase swallowing function w/ no oropharyngeal phase dysphagia appreciated; no neuromuscular swallowing deficits. Pt is at reduced risk for aspiration following general aspiration precautions. However, pt has a baseline of GERD/REFLUX on a PPI - any regurgitation of Reflux material can increase risk for aspiration of the Reflux material during Retrograde activity thus impact Pulmonary status. Pt is also Edentulous and continues to eat meats unable to be properly masticated d/t Edentulous status. Pt also  Endorsed that he ate quickly and "I can drink Milk FAST using a straw". Pt sat EOB and consumed trials of thin liquids via cup/straw w/ support, and purees, softened solids w/ no overt clinical s/s of aspiration noted; clear vocal quality b/t trials. O2 sats remained 97%. Oral phase appeared [facility] for bolus control and management except for increased time for gumming/mashing the softened solids(used applesauce to soften the cookie). Oral clearing achieved w/ Time and moistening of the solids. Lingual sweeping+. OM exam was [facility] for lingual/labial movements. No unilateral weakness. Pt felt that the dysmotility issues he has is most noted w/ "Meats" and is also "genetic in my family". Time spent on REFLUX Education; general aspiration precautions - Handouts given. Uncertain of pt's Baseline Cognitive status and/or if any Baseline decline as repetition of information was required.  Recommend a mech soft diet(w/ minced meats, moistened foods) w/ thin liquids; general aspiration precautions - monitor use of straw, cup if needed. Rest Breaks during meals/oral intake to lessen WOB during oral intake AND to allow for Esophageal clearing. REFLUX/GERD precautions strongly recommended to lessen chance for Regurgitation. Recommend pt f/u w/ GI for management of GERD/Reflux and tx as indicated. Disucssion and handouts given on REflux. NSG to reconsult ST services if any new needs while admitted. Pt agreed. SLP Visit Diagnosis: Dysphagia, oral phase (R13.11)(Edentulous for full, effective mastication of solids; Esoph.)    Aspiration Risk  (reduced following general aspiration/reflux precautions)    Diet Recommendation  Mech Soft diet w/ Meats MINCED w/ Gravies to moisten; Thin liquids. General aspiration precautions. STRICT Reflux precautions.  Medication Administration: Whole meds with liquid(1 at a time -  slow down)    Other  Recommendations Recommended Consults: Consider GI evaluation;Consider esophageal  assessment(Dietician f/u for support) Oral Care Recommendations: Oral care BID;Patient independent with oral care Other Recommendations: (n/a)   Follow up Recommendations None      Frequency and Duration (n/a)  (n/a)       Prognosis Prognosis for Safe Diet Advancement: Fair(-Good) Barriers to Reach Goals: Time post onset;Severity of deficits(Edentulous status; GERD)      Swallow Study   General Date of Onset: 09/25/19 HPI: Pt is a 85 y.o. male with medical history significant for Multiple medical issues including hypertension, Vision deficits, hyperlipidemia, COPD, anxiety, GERD, depression, glaucoma, iron deficiency anemia chronic pain syndrome, recent confusion and hallucination w/ a fall presenting to this ED last month. Pt was ordered for Home PT then. This admission, Patient reports choking on a piece of pork yesterday and had choking on some milk this morning. On EMS arrival patient was satting in the 80s on 3 L so pt was placed on a nonrebreather satting in the 90s. Portable chest x-ray showed bilateral lower lobe opacities, left greater than right concerning for pneumonia and mild cardiomegaly.  Type of Study: Bedside Swallow Evaluation Previous Swallow Assessment: none Diet Prior to this Study: Regular;Thin liquids(per pt at home; currently NPO) Temperature Spikes Noted: No(wbc 8.9) Respiratory Status: Nasal cannula(2-5L) History of Recent Intubation: No Behavior/Cognition: Alert;Cooperative;Pleasant mood(vision deficits per pt) Oral Cavity Assessment: Within Functional Limits Oral Care Completed by SLP: Yes Oral Cavity - Dentition: Edentulous(except for 1-2 teeth) Vision: Impaired for self-feeding(some deficit) Self-Feeding Abilities: Able to feed self;Needs assist;Needs set up Patient Positioning: Upright in bed(sat EOB) Baseline Vocal Quality: Normal(edentulous ) Volitional Cough: Strong;Congested(min) Volitional Swallow: Able to elicit    Oral/Motor/Sensory Function  Overall Oral Motor/Sensory Function: Within functional limits   Ice Chips Ice chips: Within functional limits Presentation: Spoon(2 trials)   Thin Liquid Thin Liquid: Within functional limits Presentation: Cup;Self Fed;Straw(4 trials via each; water via straw w/ NSG for pills also) Other Comments: difficulty seeing the straw w/out warning    Nectar Thick Nectar Thick Liquid: Not tested   Honey Thick Honey Thick Liquid: Not tested   Puree Puree: Within functional limits Presentation: Spoon(4 trials)   Solid     Solid: Impaired(Edentulous for mastication and breaking down solids) Presentation: Self Fed(supported; 5 trials) Oral Phase Impairments: Impaired mastication(edentulous) Oral Phase Functional Implications: Impaired mastication(edentulous) Pharyngeal Phase Impairments: (none) Other Comments: needed Time and moistening of the solid for ease of oral phase gumming/mashing to break down for swallowing       Orinda Kenner, MS, CCC-SLP Calvyn Kurtzman 09/26/2019,11:57 AM

## 2019-09-26 NOTE — ED Notes (Signed)
Pt placed back on [place] after oxygen dropped to 89%.

## 2019-09-26 NOTE — Evaluation (Signed)
Occupational Therapy Evaluation Patient Details Name: Nickan Theodorou MRN: [id number] DOB: 1935-03-01 Today's Date: 09/26/2019    History of Present Illness Pt is an 85 yo male that presented to ED  for concerns of SOB. Also reported choking on food /drink. PMH of anxiety, skin cancer, COPD, glaucoma, depression, HTN, HLD, chronic knee pain, and recent hospital visit for fall and confusion.    Clinical Impression   Mr Rajaram was seen for OT evaluation this date. Pt was recently admitted to hospital on 3/27 and discharged 3/29 to home. Prior to hospital admission, pt was requiring assistance from aids for I/ADLs. Pt presents to acute OT demonstrating impaired ADL performance and functional mobility 2/2 functional strength/endurance/balance deficits, vision impairments, and decreased safety awareness. Pt currently requires CGA + RW for toileting at standard commode including briefs management and perihygiene - VCs for thoroughness and line management. MIN A doff shirt and don gown seated EOB. Pt would benefit from skilled OT to address noted impairments and functional limitations (see below for any additional details) in order to maximize safety and independence while minimizing falls risk and caregiver burden. Upon hospital discharge, recommend STR to maximize pt safety and minimize risk for readmission.     Follow Up Recommendations  SNF;Supervision - Intermittent    Equipment Recommendations  Other (comment)(Defer to next venue of care)    Recommendations for Other Services       Precautions / Restrictions Precautions Precautions: Fall;Other (comment)(Aspiration) Restrictions Weight Bearing Restrictions: No      Mobility Bed Mobility Overal bed mobility: Needs Assistance Bed Mobility: Sit to Sidelying         Sit to sidelying: Supervision;HOB elevated(Increased time)    Transfers Overall transfer level: Needs assistance Equipment used: Rolling walker (2  wheeled) Transfers: Sit to/from Stand Sit to Stand: Min guard         General transfer comment: CGA + RW sit<>stand at EOB and standard commode. VCs for RW management 2/2 low vision    Balance Overall balance assessment: Needs assistance Sitting-balance support: No upper extremity supported;Feet supported Sitting balance-Leahy Scale: Normal     Standing balance support: No upper extremity supported;During functional activity Standing balance-Leahy Scale: Fair                             ADL either performed or assessed with clinical judgement   ADL Overall ADL's : Needs assistance/impaired                                       General ADL Comments: CGA + RW toileting at standard commode including briefs management and perihygiene - VCs for thoroughness and line management. SETUP + SBA handwashing c sanitizer standing in room. MIN A doff shirt and don gown seated EOB.      Vision Baseline Vision/History: Legally blind       Perception     Praxis      Pertinent Vitals/Pain Pain Assessment: No/denies pain     Hand Dominance Right   Extremity/Trunk Assessment Upper Extremity Assessment Upper Extremity Assessment: Generalized weakness   Lower Extremity Assessment Lower Extremity Assessment: Generalized weakness   Cervical / Trunk Assessment Cervical / Trunk Assessment: Kyphotic   Communication Communication Communication: No difficulties   Cognition Arousal/Alertness: Awake/alert Behavior During Therapy: WFL for tasks assessed/performed Overall Cognitive Status: Within Functional Limits for tasks assessed  General Comments  Start of session seated EOB: SpO2 94% on RA. Standing after toileting at commode: SpO2 89% on RA. End of session reclined in bed: SpO2 97% on 2L [place]    Exercises Exercises: Other exercises Other Exercises Other Exercises: Pt educated re: OT role, visual  scanning strategies, RW technique, energy conservation, and falls prevention, functional application of aspiration precautions Other Exercises: Toileting, hand hygiene, sitting/standing balance/tolerance, UBD, bed mobility, sit<>stand x2,    Shoulder Instructions      Home Living Family/patient expects to be discharged to:: Private residence Living Arrangements: Alone Available Help at Discharge: Home health;Available PRN/intermittently(Aids available intermittently ) Type of Home: House       Home Layout: One level               Home Equipment: Walker - 2 wheels;Cane - single point          Prior Functioning/Environment Level of Independence: Needs assistance  Gait / Transfers Assistance Needed: Pt reports using a SPC for functional mobility ADL's / Homemaking Assistance Needed: Pt reports aids assist with meals and supervise mobility            OT Problem List: Decreased activity tolerance;Impaired vision/perception;Decreased safety awareness;Decreased knowledge of use of DME or AE;Decreased strength      OT Treatment/Interventions: Self-care/ADL training;Therapeutic exercise;Neuromuscular education;Energy conservation;DME and/or AE instruction;Cognitive remediation/compensation;Visual/perceptual remediation/compensation;Patient/family education;Balance training    OT Goals(Current goals can be found in the care plan section) Acute Rehab OT Goals Patient Stated Goal: to go home OT Goal Formulation: With patient Time For Goal Achievement: 10/10/19 Potential to Achieve Goals: Fair ADL Goals Pt Will Perform Eating: with set-up;with supervision;sitting Pt Will Perform Lower Body Dressing: sit to/from stand;with min guard assist(c LRAD & AE PRN) Pt Will Transfer to Toilet: with supervision;ambulating;regular height toilet(c LRAD PRN) Pt Will Perform Toileting - Clothing Manipulation and hygiene: with supervision;sit to/from stand(c LRAD PRN)  OT Frequency: Min  1X/week   Barriers to D/C: Inaccessible home environment;Decreased caregiver support          Co-evaluation              AM-PAC OT "6 Clicks" Daily Activity     Outcome Measure Help from another person eating meals?: A Little Help from another person taking care of personal grooming?: A Little Help from another person toileting, which includes using toliet, bedpan, or urinal?: A Little Help from another person bathing (including washing, rinsing, drying)?: A Lot Help from another person to put on and taking off regular upper body clothing?: A Little Help from another person to put on and taking off regular lower body clothing?: A Lot 6 Click Score: 16   End of Session Equipment Utilized During Treatment: Rolling walker  Activity Tolerance: Patient tolerated treatment well Patient left: in bed;with call bell/phone within reach;with bed alarm set  OT Visit Diagnosis: Unsteadiness on feet (R26.81);Repeated falls (R29.6);Low vision, both eyes (H54.2)                Time: 1417-1450 OT Time Calculation (min): 33 min Charges:  OT General Charges $OT Visit: 1 Visit OT Evaluation $OT Eval Moderate Complexity: 1 Mod OT Treatments $Self Care/Home Management : 23-37 mins  Dessie Coma, M.S. OTR/L  09/26/19, 4:19 PM

## 2019-09-26 NOTE — Sepsis Progress Note (Signed)
Notified provider of need to order repeat lactic acid. ° °

## 2019-09-27 DIAGNOSIS — K219 Gastro-esophageal reflux disease without esophagitis: Secondary | ICD-10-CM

## 2019-09-27 LAB — CBC
HCT: 34.2 % — ABNORMAL LOW (ref 39.0–52.0)
Hemoglobin: 11 g/dL — ABNORMAL LOW (ref 13.0–17.0)
MCH: 28.6 pg (ref 26.0–34.0)
MCHC: 32.2 g/dL (ref 30.0–36.0)
MCV: 88.8 fL (ref 80.0–100.0)
Platelets: 149 10*3/uL — ABNORMAL LOW (ref 150–400)
RBC: 3.85 MIL/uL — ABNORMAL LOW (ref 4.22–5.81)
RDW: 15.1 % (ref 11.5–15.5)
WBC: 4.7 10*3/uL (ref 4.0–10.5)
nRBC: 0 % (ref 0.0–0.2)

## 2019-09-27 LAB — BASIC METABOLIC PANEL
Anion gap: 6 (ref 5–15)
BUN: 12 mg/dL (ref 8–23)
CO2: 24 mmol/L (ref 22–32)
Calcium: 8.7 mg/dL — ABNORMAL LOW (ref 8.9–10.3)
Chloride: 112 mmol/L — ABNORMAL HIGH (ref 98–111)
Creatinine, Ser: 0.85 mg/dL (ref 0.61–1.24)
GFR calc Af Amer: >60 mL/min (ref >60)
GFR calc non Af Amer: >60 mL/min (ref >60)
Glucose, Bld: 91 mg/dL (ref 70–99)
Potassium: 3.6 mmol/L (ref 3.5–5.1)
Sodium: 142 mmol/L (ref 135–145)

## 2019-09-27 LAB — URINE CULTURE: Culture: NO GROWTH

## 2019-09-27 LAB — LEGIONELLA PNEUMOPHILA SEROGP 1 UR AG: L. pneumophila Serogp 1 Ur Ag: NEGATIVE

## 2019-09-27 LAB — PROCALCITONIN: Procalcitonin: 0.27 ng/mL

## 2019-09-27 MED ORDER — DIPHENHYDRAMINE HCL 12.5 MG/5ML PO ELIX
12.5000 mg | ORAL_SOLUTION | Freq: Once | ORAL | Status: AC
  Administered 2019-09-27: 03:00:00 12.5 mg via ORAL
  Filled 2019-09-27: qty 5

## 2019-09-27 MED ORDER — IPRATROPIUM-ALBUTEROL 0.5-2.5 (3) MG/3ML IN SOLN
3.0000 mL | Freq: Four times a day (QID) | RESPIRATORY_TRACT | Status: DC | PRN
  Administered 2019-09-27: 09:00:00 3 mL via RESPIRATORY_TRACT
  Filled 2019-09-27: qty 3

## 2019-09-27 MED ORDER — AMOXICILLIN-POT CLAVULANATE 875-125 MG PO TABS: 1.0000 | ORAL_TABLET | Freq: Two times a day (BID) | ORAL | 0 refills | Status: AC

## 2019-09-27 NOTE — Progress Notes (Signed)
Discharge instructions and medication details reviewed with patient and daughter. Both verbalized understanding. Printed AVS given to patient. IV removed. Patient escorted out via wheelchair. All patient belongings packed.

## 2019-09-27 NOTE — Progress Notes (Signed)
Paged Hospitalist re: patient asking for more anxiety medicine and more medicine to help him sleep. Patient has now received Oxycodone, Ativan, Trazadone and Xanax.

## 2019-09-27 NOTE — Progress Notes (Signed)
Patient has taken off his telemetry leads and refuses to wear the unit. Informed Telemetry and Hospitalist.

## 2019-09-27 NOTE — Discharge Summary (Signed)
Physician Discharge Summary  Aaron Gibbs [id number] DOB: [birth year] DOA: 09/25/2019  PCP: Olin Hauser, DO  Admit date: 09/25/2019 Discharge date: 09/27/2019  Admitted From:  Disposition:    Recommendations for Outpatient Follow-up:  1. Follow up with PCP in 1-2 weeks 2. Please obtain BMP/CBC in one week 3. Please follow up on the following pending results:None  Home Health: Yes Equipment/Devices: None Discharge Condition: Fair  CODE STATUS: Full Diet recommendation: Heart Healthy    Brief/Interim Summary: Aaron Gibbs a84 y.o.Caucasian malewith a known history of COPD, glaucoma and anxiety, who presented to the emergency room with acute onset of worsening cough which has been occasionally productive of yellowish sputum, dyspnea without wheezing. He denies any fever or chills. He choked on pork chops yesterday while eating and milk today.  On arrival of EMS patient was hypoxic initially requiring 3 L which worsened later and he was placed on non breather. He was recently admitted here on 3/27 and discharged on 3/29 for acute metabolic encephalopathy of unclear etiology could be related to polypharmacy and progressive dementia with behavioral changes and potential medication side effect from Wellbutrin. At that time his Wellbutrin and Seroquel were discontinued and he was started on Risperdal and continued on Remeron.  On arrival procalcitonin was 0.32, mild leukocytosis, x-ray with bilateral lower lobe opacities left greater than right concerning for aspiration pneumonia.  Patient was initially started on IV Zosyn, vancomycin and doxycycline according to sepsis protocol.  Later antibiotics were switched to Unasyn.  Patient was able to weaned off from oxygen pretty quickly.  Remained afebrile.  Swallow evaluation with no pharyngeal deficit, low aspiration risk.  He was discharged on Augmentin to complete a 7-day course.  Patient with generalized weakness and  physical deconditioning.  Home health services were ordered to help him. He will follow-up with his primary care physician for further management.  There was no concern for acute exacerbation of his COPD.  He will continue rest of his home meds.  Discharge Diagnoses:  Active Problems:   Community acquired pneumonia   Acute respiratory failure with hypoxia ([place])   Aspiration pneumonia [facility])   Discharge Instructions  Discharge Instructions    Ambulatory referral to Gastroenterology   Complete by: As directed    Esophageal motility dysfunction   Diet - low sodium heart healthy   Complete by: As directed    Discharge instructions   Complete by: As directed    It was pleasure taking care of you. Please complete the course of your antibiotics and follow-up with your primary care physician for further recommendations. You also need to see a gastroenterologist as an outpatient due to having some difficulty with swallowing.   Increase activity slowly   Complete by: As directed      Allergies as of 09/27/2019      Reactions   Azithromycin Shortness Of Breath      Medication List    TAKE these medications   albuterol 108 (90 Base) MCG/ACT inhaler Commonly known as: VENTOLIN HFA INHALE 1-2 PUFFS INTO THE LUNGS EVERY 6 HOURS AS NEEDED   amLODipine 10 MG tablet Commonly known as: NORVASC Take 10 mg by mouth daily.   amoxicillin-clavulanate 875-125 MG tablet Commonly known as: AUGMENTIN Take 1 tablet by mouth 2 (two) times daily for 5 days.   Anoro Ellipta 62.5-25 MCG/INH Aepb Generic drug: umeclidinium-vilanterol Inhale 1 puff into the lungs daily.   aspirin EC 81 MG tablet Take 81 mg by mouth daily.   bacitracin ophthalmic  ointment 1 application as needed. Apply to affected eye   diclofenac sodium 1 % Gel Commonly known as: VOLTAREN Apply 1 application topically as needed.   DULoxetine 60 MG capsule Commonly known as: CYMBALTA Take 1 capsule (60 mg total) by mouth  daily.   fenofibrate 145 MG tablet Commonly known as: TRICOR Take 145 mg by mouth daily.   ferrous sulfate 325 (65 FE) MG tablet Take 325 mg by mouth daily with breakfast.   meloxicam 15 MG tablet Commonly known as: MOBIC Take 1 tablet (15 mg total) by mouth daily.   multivitamin with minerals Tabs tablet Take 1 tablet by mouth daily.   Oxycodone HCl 10 MG Tabs Take 1 tablet (10 mg total) by mouth 3 (three) times daily as needed. Must last 30 days. What changed: Another medication with the same name was removed. Continue taking this medication, and follow the directions you see here.   Oxycodone HCl 10 MG Tabs Take 1 tablet (10 mg total) by mouth 3 (three) times daily as needed. Must last 30 days. What changed: Another medication with the same name was removed. Continue taking this medication, and follow the directions you see here.   pantoprazole 40 MG tablet Commonly known as: PROTONIX Take 40 mg by mouth daily.   prednisoLONE acetate 1 % ophthalmic suspension Commonly known as: PRED FORTE Place 2 drops into the left eye daily.   pregabalin 150 MG capsule Commonly known as: LYRICA Take 1 capsule (150 mg total) by mouth 3 (three) times daily. Must last 30 days   risperiDONE 0.5 MG tablet Commonly known as: RisperDAL Take 1 tablet (0.5 mg total) by mouth 2 (two) times daily.   timolol 0.5 % ophthalmic solution Commonly known as: TIMOPTIC 1 drop 2 (two) times daily. Apply one drop to eye(s)   traZODone 50 MG tablet Commonly known as: DESYREL TAKE 1 TABLET BY MOUTH AT BEDTIME      Follow-up Information    Olin Hauser, DO. Schedule an appointment as soon as possible for a visit.   Specialty: Family Medicine Contact information: [address] [place] [address] [phone]          Allergies  Allergen Reactions  . Azithromycin Shortness Of Breath    Consultations:  None  Procedures/Studies: CT Head Wo Contrast  Result Date:  09/13/2019 CLINICAL DATA:  Altered mental status EXAM: CT HEAD WITHOUT CONTRAST TECHNIQUE: Contiguous axial images were obtained from the base of the skull through the vertex without intravenous contrast. COMPARISON:  03/29/2019 FINDINGS: Brain: No evidence of acute infarction, hemorrhage, hydrocephalus, extra-axial collection or mass lesion/mass effect. Vascular: No hyperdense vessel or unexpected calcification. Skull: No osseous abnormality. Sinuses/Orbits: Visualized paranasal sinuses are clear. Visualized mastoid sinuses are clear. Visualized orbits demonstrate no focal abnormality. Other: None IMPRESSION: 1. No acute intracranial pathology. 2. Chronic microvascular disease and cerebral atrophy. Electronically Signed   By: Kathreen Devoid   On: 09/13/2019 05:39   MR BRAIN WO CONTRAST  Result Date: 09/13/2019 CLINICAL DATA:  Encephalopathy. Confusion and hallucinations. EXAM: MRI HEAD WITHOUT CONTRAST TECHNIQUE: Multiplanar, multiecho pulse sequences of the brain and surrounding structures were obtained without intravenous contrast. COMPARISON:  Head CT 09/13/2019 FINDINGS: Brain: There is no evidence of acute infarct, intracranial hemorrhage, mass, midline shift, or extra-axial fluid collection. Patchy T2 hyperintensities in the cerebral white matter bilaterally are nonspecific but compatible with moderate chronic small vessel ischemic disease. There is mild cerebral atrophy. Vascular: Major intracranial vascular flow voids are preserved. Skull  and upper cervical spine: Unremarkable bone marrow signal. Sinuses/Orbits: Bilateral cataract extraction. Paranasal sinuses and mastoid air cells are clear. Other: 1.5 cm T2 hypointense nodule in the superficial aspect of the right parotid gland. IMPRESSION: 1. No acute intracranial abnormality. 2. Moderate chronic small vessel ischemic disease. 3. 1.5 cm right parotid mass, nonspecific though may reflect a primary parotid neoplasm. Electronically Signed   By: Logan Bores M.D.   On: 09/13/2019 19:35   DG Chest Portable 1 View  Result Date: 09/25/2019 CLINICAL DATA:  Shortness of breath EXAM: PORTABLE CHEST 1 VIEW COMPARISON:  09/13/2019 FINDINGS: Low lung volumes. Bilateral lower lobe airspace opacities, left greater than right. Mild cardiomegaly. No effusions. No acute bony abnormality. IMPRESSION: Bilateral lower lobe opacities, left greater than right concerning for pneumonia. Mild cardiomegaly. Electronically Signed   By: Rolm Baptise M.D.   On: 09/25/2019 18:37   DG Chest Port 1 View  Result Date: 09/13/2019 CLINICAL DATA:  Syncopal episode. EXAM: PORTABLE CHEST 1 VIEW COMPARISON:  03/30/2019 FINDINGS: There is mild lingular scarring. There is minimal right middle lobe scarring. There is no focal consolidation. There is no pleural effusion or pneumothorax. The heart and mediastinal contours are unremarkable. There is no acute osseous abnormality. IMPRESSION: No acute cardiopulmonary disease. Electronically Signed   By: Kathreen Devoid   On: 09/13/2019 05:37   DG Knee Complete 4 Views Left  Result Date: 09/13/2019 CLINICAL DATA:  Status post fall, syncopal episode EXAM: LEFT KNEE - COMPLETE 4+ VIEW COMPARISON:  None. FINDINGS: No acute fracture or dislocation. No aggressive osseous lesion. Mild lateral femorotibial compartment joint space narrowing. No joint effusion. No soft tissue abnormality. Peripheral vascular atherosclerotic disease. IMPRESSION: No acute osseous injury of the left knee. Electronically Signed   By: Kathreen Devoid   On: 09/13/2019 04:13     Subjective: Patient was agitated overnight.  He was calm and comfortable when seen during morning rounds.  Discharge Exam: Vitals:   09/27/19 1051 09/27/19 1240  BP:  (!) 142/63  Pulse:  77  Resp:  20  Temp:  97.8 F (36.6 C)  SpO2: 98% 96%   Vitals:   09/26/19 2357 09/27/19 0845 09/27/19 1051 09/27/19 1240  BP: (!) 126/57 (!) 145/80  (!) 142/63  Pulse: 78 93  77  Resp: 18 18  20   Temp:  98.9 F (37.2 C) 98.4 F (36.9 C)  97.8 F (36.6 C)  TempSrc: Oral Oral  Oral  SpO2: 93% 97% 98% 96%  Weight:      Height:        General: Pt is alert, awake, not in acute distress. Cardiovascular: RRR, S1/S2 +, no rubs, no gallops Respiratory: CTA bilaterally, no wheezing, no rhonchi Abdominal: Soft, NT, ND, bowel sounds + Extremities: no edema, no cyanosis   The results of significant diagnostics from this hospitalization (including imaging, microbiology, ancillary and laboratory) are listed below for reference.    Microbiology: Recent Results (from the past 240 hour(s))  Blood culture (routine x 2)     Status: None (Preliminary result)   Collection Time: 09/25/19  6:58 PM   Specimen: BLOOD  Result Value Ref Range Status   Specimen Description BLOOD RFA  Final   Special Requests   Final    BOTTLES DRAWN AEROBIC AND ANAEROBIC Blood Culture adequate volume   Culture   Final    NO GROWTH 2 DAYS Performed at [facility], [address] [place], [place] [address]    Report Status PENDING  Incomplete  Respiratory Panel by RT PCR (Flu A&B, Covid) - Nasopharyngeal Swab     Status: None   Collection Time: 09/25/19  8:12 PM   Specimen: Nasopharyngeal Swab  Result Value Ref Range Status   SARS Coronavirus 2 by RT PCR NEGATIVE NEGATIVE Final    Comment: (NOTE) SARS-CoV-2 target nucleic acids are NOT DETECTED. The SARS-CoV-2 RNA is generally detectable in upper respiratoy specimens during the acute phase of infection. The lowest concentration of SARS-CoV-2 viral copies this assay can detect is 131 copies/mL. A negative result does not preclude SARS-Cov-2 infection and should not be used as the sole basis for treatment or other patient management decisions. A negative result may occur with  improper specimen collection/handling, submission of specimen other than nasopharyngeal swab, presence of viral mutation(s) within the areas targeted by this assay, and inadequate  number of viral copies (<131 copies/mL). A negative result must be combined with clinical observations, patient history, and epidemiological information. The expected result is Negative. Fact Sheet for Patients:  PinkCheek.be Fact Sheet for Healthcare Providers:  GravelBags.it This test is not yet ap proved or cleared by the [place] FDA and  has been authorized for detection and/or diagnosis of SARS-CoV-2 by FDA under an Emergency Use Authorization (EUA). This EUA will remain  in effect (meaning this test can be used) for the duration of the COVID-19 declaration under Section 564(b)(1) of the Act, 21 U.S.C. section 360bbb-3(b)(1), unless the authorization is terminated or revoked sooner.    Influenza A by PCR NEGATIVE NEGATIVE Final   Influenza B by PCR NEGATIVE NEGATIVE Final    Comment: (NOTE) The Xpert Xpress SARS-CoV-2/FLU/RSV assay is intended as an aid in  the diagnosis of influenza from Nasopharyngeal swab specimens and  should not be used as a sole basis for treatment. Nasal washings and  aspirates are unacceptable for Xpert Xpress SARS-CoV-2/FLU/RSV  testing. Fact Sheet for Patients: PinkCheek.be Fact Sheet for Healthcare Providers: GravelBags.it This test is not yet approved or cleared by the [place] FDA and  has been authorized for detection and/or diagnosis of SARS-CoV-2 by  FDA under an Emergency Use Authorization (EUA). This EUA will remain  in effect (meaning this test can be used) for the duration of the  Covid-19 declaration under Section 564(b)(1) of the Act, 21  U.S.C. section 360bbb-3(b)(1), unless the authorization is  terminated or revoked. Performed at [facility], [address] [place], [place] [address]   Blood culture (routine x 2)     Status: None (Preliminary result)   Collection Time: 09/25/19  8:12 PM    Specimen: BLOOD  Result Value Ref Range Status   Specimen Description BLOOD BLOOD RIGHT ARM  Final   Special Requests   Final    BOTTLES DRAWN AEROBIC AND ANAEROBIC Blood Culture adequate volume   Culture   Final    NO GROWTH 2 DAYS Performed at [facility], [address] [place], [place] [address]    Report Status PENDING  Incomplete  Urine culture     Status: None   Collection Time: 09/25/19  8:12 PM   Specimen: In/Out Cath Urine  Result Value Ref Range Status   Specimen Description   Final    IN/OUT CATH URINE Performed at [facility], [address] [place], [place] [address]    Special Requests   Final    NONE Performed at [facility], [address] [place], [place] [address]    Culture  Final    NO GROWTH Performed at [facility], [place] [address], [place], [place] [address]    Report Status 09/27/2019 FINAL  Final     Labs: BNP (last 3 results) Recent Labs    02/13/19 1748 03/28/19 2132 09/25/19 1858  BNP 35.0 52.0 [id number]*   Basic Metabolic Panel: Recent Labs  Lab 09/25/19 1858 09/26/19 0310 09/27/19 0629  NA 138 139 142  K 3.9 3.6 3.6  CL 104 108 112*  CO2 27 24 24   GLUCOSE 125* 115* 91  BUN 14 15 12   CREATININE 1.19 1.09 0.85  CALCIUM 9.3 8.6* 8.7*   Liver Function Tests: Recent Labs  Lab 09/25/19 1858  AST 32  ALT 27  ALKPHOS 45  BILITOT 0.8  PROT 6.6  ALBUMIN 3.5   No results for input(s): LIPASE, AMYLASE in the last 168 hours. No results for input(s): AMMONIA in the last 168 hours. CBC: Recent Labs  Lab 09/25/19 1858 09/26/19 0310 09/27/19 0629  WBC 9.9 8.9 4.7  NEUTROABS 6.1  --   --   HGB 12.9* 11.0* 11.0*  HCT 39.8 35.4* 34.2*  MCV 88.6 91.9 88.8  PLT 185 144* 149*   Cardiac Enzymes: No results for input(s): CKTOTAL, CKMB, CKMBINDEX, TROPONINI in the last 168 hours. BNP: Invalid input(s): POCBNP CBG: No results for input(s): GLUCAP in the last 168 hours. D-Dimer No results  for input(s): DDIMER in the last 72 hours. Hgb A1c No results for input(s): HGBA1C in the last 72 hours. Lipid Profile No results for input(s): CHOL, HDL, LDLCALC, TRIG, CHOLHDL, LDLDIRECT in the last 72 hours. Thyroid function studies No results for input(s): TSH, T4TOTAL, T3FREE, THYROIDAB in the last 72 hours.  Invalid input(s): FREET3 Anemia work up No results for input(s): VITAMINB12, FOLATE, FERRITIN, TIBC, IRON, RETICCTPCT in the last 72 hours. Urinalysis    Component Value Date/Time   COLORURINE YELLOW (A) 03/28/2019 2226   APPEARANCEUR HAZY (A) 03/28/2019 2226   LABSPEC 1.025 03/28/2019 2226   PHURINE 5.0 03/28/2019 2226   GLUCOSEU NEGATIVE 03/28/2019 2226   HGBUR NEGATIVE 03/28/2019 2226   BILIRUBINUR NEGATIVE 03/28/2019 2226   [place] 03/28/2019 2226   PROTEINUR 30 (A) 03/28/2019 2226   NITRITE NEGATIVE 03/28/2019 2226   LEUKOCYTESUR MODERATE (A) 03/28/2019 2226   Sepsis Labs Invalid input(s): PROCALCITONIN,  WBC,  LACTICIDVEN Microbiology Recent Results (from the past 240 hour(s))  Blood culture (routine x 2)     Status: None (Preliminary result)   Collection Time: 09/25/19  6:58 PM   Specimen: BLOOD  Result Value Ref Range Status   Specimen Description BLOOD RFA  Final   Special Requests   Final    BOTTLES DRAWN AEROBIC AND ANAEROBIC Blood Culture adequate volume   Culture   Final    NO GROWTH 2 DAYS Performed at [facility], [address], [place], [place] [address]    Report Status PENDING  Incomplete  Respiratory Panel by RT PCR (Flu A&B, Covid) - Nasopharyngeal Swab     Status: None   Collection Time: 09/25/19  8:12 PM   Specimen: Nasopharyngeal Swab  Result Value Ref Range Status   SARS Coronavirus 2 by RT PCR NEGATIVE NEGATIVE Final    Comment: (NOTE) SARS-CoV-2 target nucleic acids are NOT DETECTED. The SARS-CoV-2 RNA is generally detectable in upper respiratoy specimens during the acute phase of infection. The  lowest concentration of SARS-CoV-2 viral copies this assay can detect is 131 copies/mL. A negative result does not  preclude SARS-Cov-2 infection and should not be used as the sole basis for treatment or other patient management decisions. A negative result may occur with  improper specimen collection/handling, submission of specimen other than nasopharyngeal swab, presence of viral mutation(s) within the areas targeted by this assay, and inadequate number of viral copies (<131 copies/mL). A negative result must be combined with clinical observations, patient history, and epidemiological information. The expected result is Negative. Fact Sheet for Patients:  PinkCheek.be Fact Sheet for Healthcare Providers:  GravelBags.it This test is not yet ap proved or cleared by the [place] FDA and  has been authorized for detection and/or diagnosis of SARS-CoV-2 by FDA under an Emergency Use Authorization (EUA). This EUA will remain  in effect (meaning this test can be used) for the duration of the COVID-19 declaration under Section 564(b)(1) of the Act, 21 U.S.C. section 360bbb-3(b)(1), unless the authorization is terminated or revoked sooner.    Influenza A by PCR NEGATIVE NEGATIVE Final   Influenza B by PCR NEGATIVE NEGATIVE Final    Comment: (NOTE) The Xpert Xpress SARS-CoV-2/FLU/RSV assay is intended as an aid in  the diagnosis of influenza from Nasopharyngeal swab specimens and  should not be used as a sole basis for treatment. Nasal washings and  aspirates are unacceptable for Xpert Xpress SARS-CoV-2/FLU/RSV  testing. Fact Sheet for Patients: PinkCheek.be Fact Sheet for Healthcare Providers: GravelBags.it This test is not yet approved or cleared by the [place] FDA and  has been authorized for detection and/or diagnosis of SARS-CoV-2 by  FDA under an Emergency  Use Authorization (EUA). This EUA will remain  in effect (meaning this test can be used) for the duration of the  Covid-19 declaration under Section 564(b)(1) of the Act, 21  U.S.C. section 360bbb-3(b)(1), unless the authorization is  terminated or revoked. Performed at [facility], [address] [place], [place] [address]   Blood culture (routine x 2)     Status: None (Preliminary result)   Collection Time: 09/25/19  8:12 PM   Specimen: BLOOD  Result Value Ref Range Status   Specimen Description BLOOD BLOOD RIGHT ARM  Final   Special Requests   Final    BOTTLES DRAWN AEROBIC AND ANAEROBIC Blood Culture adequate volume   Culture   Final    NO GROWTH 2 DAYS Performed at [facility], [address], [place], [place] [address]    Report Status PENDING  Incomplete  Urine culture     Status: None   Collection Time: 09/25/19  8:12 PM   Specimen: In/Out Cath Urine  Result Value Ref Range Status   Specimen Description   Final    IN/OUT CATH URINE Performed at [facility], [address] [place], [place] [address]    Special Requests   Final    NONE Performed at [facility], [address], [place], [place] [address]    Culture   Final    NO GROWTH Performed at [facility], [place] [address], [place], [place] [address]    Report Status 09/27/2019 FINAL  Final    Time coordinating discharge: Over 30 minutes  SIGNED:  Lorella Nimrod, MD  [facility] 09/27/2019, 1:00 PM  If 7PM-7AM, please contact night-coverage [email or web address]  This record has been created using Systems analyst. Errors have been sought and corrected,but may not always be located. Such creation errors do not reflect on the standard of care.

## 2019-09-29 ENCOUNTER — Telehealth: Payer: Self-pay

## 2019-09-29 ENCOUNTER — Ambulatory Visit: Payer: Self-pay | Admitting: Pharmacist

## 2019-09-29 ENCOUNTER — Telehealth: Payer: Medicare Other

## 2019-09-29 NOTE — Telephone Encounter (Signed)
I have made the 1st attempt to contact the patient or family member in charge, in order to follow up from recently being discharged from the hospital. I left a message on voicemail but I will make another attempt at a different time.  

## 2019-09-29 NOTE — Chronic Care Management (AMB) (Signed)
  Chronic Care Management   Follow Up Note   09/29/2019 Name: Aaron Gibbs MRN: [id number] DOB: 12-03-1934  Referred by: Olin Hauser, DO Reason for referral : Chronic Care Management (Initial Patient Outreach Call)   Aaron Gibbs is a 85 y.o. year old male who is a primary care patient of Olin Hauser, DO. The CCM team was consulted for assistance with chronic disease management and care coordination needs.    Was unable to reach either patient on daughter Aaron Gibbs via telephone today and have left HIPAA compliant voicemail x2 asking for return call.   Plan  The care management team will reach out to the patient again over the next 30 days.   [name], PharmD, [place] [employer] [phone]

## 2019-09-30 ENCOUNTER — Telehealth: Payer: Self-pay | Admitting: Family Medicine

## 2019-09-30 LAB — CULTURE, BLOOD (ROUTINE X 2)
Culture: NO GROWTH
Culture: NO GROWTH
Special Requests: ADEQUATE
Special Requests: ADEQUATE

## 2019-09-30 NOTE — Telephone Encounter (Signed)
   [facility] 09/30/2019 1st Attempt  Name: Aaron Gibbs   MRN: [id number]   DOB: 1934-11-14   AGE: 85 y.o.   GENDER: male   PCP Olin Hauser, DO.   Called pt regarding [name] Referral for Food, blindness, transportation, advanced care options.  Follow up on: 10/01/19  The patient can not see well: what are resources in the community for services for the blind?  The patients daughter states he would benefit from food resources: Would he qualify for [employer]? The patient can not drive. He has a caregiver during the day but could use resources related to transportation. Possible need for advanced care options as the patients condition worsens due to cognitive impairment. Best to call patients daughter [name]: [phone]  Noreene Larsson, RN, MSN, CCM- [phone]   [place] . [email or web address]  [phone]

## 2019-09-30 NOTE — Telephone Encounter (Signed)
I have made the 2nd attempt to contact the patient or family member in charge, in order to follow up from recently being discharged from the hospital.  

## 2019-10-02 ENCOUNTER — Ambulatory Visit (INDEPENDENT_AMBULATORY_CARE_PROVIDER_SITE_OTHER): Payer: Medicare Other | Admitting: Licensed Clinical Social Worker

## 2019-10-02 DIAGNOSIS — J432 Centrilobular emphysema: Secondary | ICD-10-CM | POA: Diagnosis not present

## 2019-10-02 DIAGNOSIS — I10 Essential (primary) hypertension: Secondary | ICD-10-CM

## 2019-10-02 DIAGNOSIS — F333 Major depressive disorder, recurrent, severe with psychotic symptoms: Secondary | ICD-10-CM | POA: Diagnosis not present

## 2019-10-02 DIAGNOSIS — R410 Disorientation, unspecified: Secondary | ICD-10-CM

## 2019-10-02 DIAGNOSIS — J441 Chronic obstructive pulmonary disease with (acute) exacerbation: Secondary | ICD-10-CM

## 2019-10-02 DIAGNOSIS — G3184 Mild cognitive impairment, so stated: Secondary | ICD-10-CM

## 2019-10-02 NOTE — Chronic Care Management (AMB) (Signed)
Chronic Care Management    Clinical Social Work Follow Up Note  10/02/2019 Name: Aaron Gibbs MRN: [id number] DOB: [birth year]  Aaron Gibbs is a 85 y.o. year old male who is a primary care patient of Olin Hauser, DO. The CCM team was consulted for assistance with [employer] .   Review of patient status, including review of consultants reports, Aaron relevant assessments, and collaboration with appropriate care team members and the patient's provider was performed as part of comprehensive patient evaluation and provision of chronic care management services.    SDOH (Social Determinants of Health) assessments performed: Yes    Outpatient Encounter Medications as of 10/02/2019  Medication Sig Note  . albuterol (VENTOLIN HFA) 108 (90 Base) MCG/ACT inhaler INHALE 1-2 PUFFS INTO THE LUNGS EVERY 6 HOURS AS NEEDED   . amLODipine (NORVASC) 10 MG tablet Take 10 mg by mouth daily.   Marland Kitchen amoxicillin-clavulanate (AUGMENTIN) 875-125 MG tablet Take 1 tablet by mouth 2 (two) times daily for 5 days.   Marland Kitchen aspirin EC 81 MG tablet Take 81 mg by mouth daily.   . bacitracin ophthalmic ointment 1 application as needed. Apply to affected eye   . diclofenac sodium (VOLTAREN) 1 % GEL Apply 1 application topically as needed.   . DULoxetine (CYMBALTA) 60 MG capsule Take 1 capsule (60 mg total) by mouth daily.   . fenofibrate (TRICOR) 145 MG tablet Take 145 mg by mouth daily.   . ferrous sulfate 325 (65 FE) MG tablet Take 325 mg by mouth daily with breakfast.   . meloxicam (MOBIC) 15 MG tablet Take 1 tablet (15 mg total) by mouth daily.   . Multiple Vitamin (MULTIVITAMIN WITH MINERALS) TABS tablet Take 1 tablet by mouth daily.   . Oxycodone HCl 10 MG TABS Take 1 tablet (10 mg total) by mouth 3 (three) times daily as needed. Must last 30 days.   . Oxycodone HCl 10 MG TABS Take 1 tablet (10 mg total) by mouth 3 (three) times daily as needed. Must last 30 days.   . pantoprazole (PROTONIX) 40 MG  tablet Take 40 mg by mouth daily.    . prednisoLONE acetate (PRED FORTE) 1 % ophthalmic suspension Place 2 drops into the left eye daily.   . pregabalin (LYRICA) 150 MG capsule Take 1 capsule (150 mg total) by mouth 3 (three) times daily. Must last 30 days 04/25/2019: Taking 150mg  once daily now, instead of TID   . risperiDONE (RISPERDAL) 0.5 MG tablet Take 1 tablet (0.5 mg total) by mouth 2 (two) times daily.   . timolol (TIMOPTIC) 0.5 % ophthalmic solution 1 drop 2 (two) times daily. Apply one drop to eye(s)   . traZODone (DESYREL) 50 MG tablet TAKE 1 TABLET BY MOUTH AT BEDTIME   . umeclidinium-vilanterol (ANORO ELLIPTA) 62.5-25 MCG/INH AEPB Inhale 1 puff into the lungs daily.    No facility-administered encounter medications on file as of 10/02/2019.     Goals Addressed    . SW: I need more support at this time (pt-stated)       CARE PLAN ENTRY (see longitudinal plan of care for additional care plan information)  Current Barriers:  . Financial constraints related to managing health care . Limited social support . ADL IADL limitations . Social Isolation . Memory Deficits . Inability to perform ADL's independently . Inability to perform IADL's independently . Transportation barriers . Food insecurity  Clinical Social Work Clinical Goal(s):  Marland Kitchen Over the next 120 days, patient will work with SW  to address concerns related to gaining appropriate support within the home and getting connected to needed community resources . Over the next 120 days, patient will demonstrate improved health management independence as evidenced by implementing appropriate self-care methods and community resource connections in order to improve overall health and well-being  Interventions: . Inter-disciplinary care team collaboration (see longitudinal plan of care) . Patient interviewed and appropriate assessments performed . Provided patient with information about local community resources that are available  to him. [name] Guide is already involved and has made appropriate referrals.  Marland Kitchen LCSW provided extensive education on healthy self-care  . [employer] Transportation benefit education provided to patient. Patient wrote down number. Patient does not drive but has a caregiver during the day. Marland Kitchen Possible need for advanced care options as the patients condition worsens due to cognitive impairment. . Discussed plans with patient for ongoing care management follow up and provided patient with direct contact information for care management team . Advised patient to contact [employer]  . Assisted patient/caregiver with obtaining information about health plan benefits . Provided education and assistance to client regarding Advanced Directives.  . Provided education to patient/caregiver regarding level of care options.  Patient Self Care Activities:  . Calls provider office for new concerns or questions . Lacks social connections  Initial goal documentation     Follow Up Plan: SW will follow up with patient by phone over the next quarter  Eula Fried, [place], MSW, [email or web address] Phone: [phone]

## 2019-10-03 NOTE — Telephone Encounter (Signed)
   KNB 10/03/2019 2nd attempt  Name: Aaron Gibbs   MRN: [id number]   DOB: Aug 21, 1934   AGE: 85 y.o.   GENDER: male   PCP Olin Hauser, DO.   Called pt regarding Community Resource Referral for [facility] Follow up on: 4/19  [place] . [email or web address]  [phone]

## 2019-10-06 ENCOUNTER — Encounter: Payer: Self-pay | Admitting: Family Medicine

## 2019-10-06 NOTE — Telephone Encounter (Signed)
Letter mailed to Patient with [facility] [address] [place], [place]  [address] Phone:  [phone]   Fax:  [phone]   October 06, 2019   Yahel Liming [address] [place] [address]   Dear Mr. Tiet,  Thank you for taking the time to speak with me on the phone today regarding community resources.  Enclosed is the information regarding Orthoptist options through [employer] that you may find useful, please call me at the number listed below if you have any follow-up questions.  [place] . [email or web address] [phone]

## 2019-10-06 NOTE — Telephone Encounter (Signed)
Email to [place]  From: Jill Alexanders [facility])  Sent: Monday, October 06, 2019 9:06 AM To: [email or web address] Subject: Secure: Referral Meal Delivery - [facility]  Good Afternoon [place],  Please see attached application.  Patient Name:             Aaron Gibbs Patient Address:         [address] [place], [place] [address] Patient Age:                85 Phone:                         [phone] Practice:                      [facility]  Patient stated that he does have a freezer (and space within freezer) and a microwave available for frozen meal delivery.  Thank you and please let me know if you have any questions,  [place] . [email or web address]  [phone]

## 2019-10-06 NOTE — Telephone Encounter (Signed)
   KNB 10/06/2019  Name: Syd Sancen   MRN: [id number]   DOB: May 30, 1935   AGE: 85 y.o.   GENDER: male   PCP Olin Hauser, DO.   Called pt regarding [name] Referral for food, transportation, and advanced care options. Spoke with pt about need for prepared meals and he agreed for the referral to be sent to the [employer] for the congregate meal program and [employer] shelf stable food delivery. Care Guide will submit referral.  Pt was unaware of [employer] option through his [employer]. Asked that I send him the information so he can have it for future appointments, care guide will mail letter.  We discussed his need for advanced care beyond the aide that has been hired to help him in the home. He stated that his daughter will be working to help get overhead lights installed to avoid tripping. As he only lamps at this point. I informed him about the [place] [facility] that could be of use if he felt he needed in the future. He said he had grab bars installed in shower and is able to get in and out of home carefully. Let him know my phone number will be included in the letter and to please reach out if he needs further assistance.  Closing referral pending any other needs of patient.     [place] . [email or web address]  [phone]

## 2019-10-13 ENCOUNTER — Telehealth: Payer: Self-pay

## 2019-10-13 ENCOUNTER — Telehealth: Payer: Medicare Other

## 2019-10-13 ENCOUNTER — Encounter: Payer: Self-pay | Admitting: Pain Medicine

## 2019-10-13 NOTE — Progress Notes (Signed)
Patient: Aaron Gibbs  Service Category: E/M  Provider: Gaspar Cola, MD  DOB: Dec 07, 1934  DOS: 10/14/2019  Location: Office  MRN: [id number]  Setting: Ambulatory outpatient  Referring Provider: [name] *  Type: Established Patient  Specialty: Interventional Pain Management  PCP: Olin Hauser, DO  Location: Remote location  Delivery: TeleHealth     Virtual Encounter - Pain Management PROVIDER NOTE: Information contained herein reflects review and annotations entered in association with encounter. Interpretation of such information and data should be left to medically-trained personnel. Information provided to patient can be located elsewhere in the medical record under "Patient Instructions". Document created using STT-dictation technology, any transcriptional errors that may result from process are unintentional.    Contact & Pharmacy Preferred: [phone] Home: [phone] (home) Mobile: [phone] (mobile) E-mail: [email or web address]  [place], [place]. [place] [address] Phone: [phone] Fax: [phone]   Pre-screening  Aaron Gibbs offered "in-person" vs "virtual" encounter. He indicated preferring virtual for this encounter.   Reason COVID-19*  Social distancing based on CDC and AMA recommendations.   I contacted Aaron Gibbs on 10/14/2019 via telephone.      I clearly identified myself as Gaspar Cola, MD. I verified that I was speaking with the correct person using two identifiers (Name: Aaron Gibbs, and date of birth: 05/25/1935).  Consent I sought verbal advanced consent from Aaron Gibbs for virtual visit interactions. I informed Aaron Gibbs of possible security and privacy concerns, risks, and limitations associated with providing "not-in-person" medical evaluation and management services. I also informed Aaron Gibbs of the availability of "in-person" appointments.  Finally, I informed him that there would be a charge for the virtual visit and that he could be  personally, fully or partially, financially responsible for it. Aaron Gibbs expressed understanding and agreed to proceed.   Historic Elements   Aaron Gibbs is a 85 y.o. year old, male patient evaluated today after his last contact with our practice on 10/13/2019. Aaron Gibbs  has a past medical history of Anxiety, Cancer ([place]), COPD (chronic obstructive pulmonary disease) ([place]), Glaucoma, and Osteoporosis. He also  has a past surgical history that includes Cholecystectomy and Transurethral resection of prostate (N/A, 2008). Aaron Gibbs has a current medication list which includes the following prescription(s): albuterol, amlodipine, aspirin ec, bacitracin, bupropion, diclofenac sodium, duloxetine, fenofibrate, ferrous sulfate, meloxicam, multivitamin with minerals, [START ON 10/24/2019] oxycodone hcl, [START ON 11/23/2019] oxycodone hcl, [START ON 12/23/2019] oxycodone hcl, pantoprazole, prednisolone acetate, pregabalin, quetiapine, risperidone, timolol, trazodone, and umeclidinium-vilanterol. He  reports that he has quit smoking. He quit after 15.00 years of use. His smokeless tobacco use includes chew. He reports current alcohol use. He reports that he does not use drugs. Aaron Gibbs is allergic to azithromycin.   HPI  Today, he is being contacted for medication management.  The patient indicates doing well with the current medication regimen. No adverse reactions or side effects reported to the medications.  During my review of the chart I noticed that the patient had been to the emergency room secondary to disorientation and loss of balance.  He admitted this to me today indicating that it was secondary to an antidepressant that he recently had started.  He indicated that they identified this as the cause of the problem and change the medication.  Since then, he has experienced no balance problems, no  hallucinations, and no disorientation.  He is also very enthusiastic about the radiofrequency hoping  that it will last for a long time.  Pharmacotherapy Assessment  Analgesic: Oxycodone IR 10 mg, 1 tab PO q 8 hrs (30 mg/day of oxycodone) (has enough to last until 02/24/2019) MME/day: 45 mg/day.   Monitoring: [place] PMP: PDMP reviewed during this encounter.       Pharmacotherapy: No side-effects or adverse reactions reported. Compliance: No problems identified. Effectiveness: Clinically acceptable. Plan: Refer to "POC".  UDS:  Summary  Date Value Ref Range Status  07/10/2018 FINAL  Final    Comment:    ==================================================================== TOXASSURE COMP DRUG ANALYSIS,UR ==================================================================== Test                             Result       Flag       Units Drug Present and Declared for Prescription Verification   Morphine                       >39030       EXPECTED   ng/mg creat   Normorphine                    221          EXPECTED   ng/mg creat    Potential sources of large amounts of morphine in the absence of    codeine include administration of morphine or use of heroin.    Normorphine is an expected metabolite of morphine.   Hydromorphone                  88           EXPECTED   ng/mg creat    Hydromorphone may be present as a metabolite of morphine;    concentrations of hydromorphone rarely exceed 5% of the morphine    concentration when this is the source of hydromorphone.   Oxycodone                      1094         EXPECTED   ng/mg creat   Oxymorphone                    1028         EXPECTED   ng/mg creat   Noroxycodone                   3205         EXPECTED   ng/mg creat   Noroxymorphone                 323          EXPECTED   ng/mg creat    Sources of oxycodone are scheduled prescription medications.    Oxymorphone, noroxycodone, and noroxymorphone are expected    metabolites of oxycodone.  Oxymorphone is also available as a    scheduled prescription medication.   Duloxetine                     PRESENT      EXPECTED   Quetiapine                     PRESENT      EXPECTED   Acetaminophen                  PRESENT      EXPECTED Drug Present  not Declared for Prescription Verification   Chlorpheniramine               PRESENT      UNEXPECTED   Hydroxyzine                    PRESENT      UNEXPECTED Drug Absent but Declared for Prescription Verification   Pregabalin                     Not Detected UNEXPECTED   Salicylate                     Not Detected UNEXPECTED    Aspirin, as indicated in the declared medication list, is not    always detected even when used as directed. ==================================================================== Test                      Result    Flag   Units      Ref Range   Creatinine              94               mg/dL      >=20 ==================================================================== Declared Medications:  The flagging and interpretation on this report are based on the  following declared medications.  Unexpected results may arise from  inaccuracies in the declared medications.  **Note: The testing scope of this panel includes these medications:  Duloxetine  Morphine (Morphine Sulfate)  Oxycodone (Oxycodone Acetaminophen)  Pregabalin  Quetiapine  **Note: The testing scope of this panel does not include small to  moderate amounts of these reported medications:  Acetaminophen (Oxycodone Acetaminophen)  Aspirin  **Note: The testing scope of this panel does not include following  reported medications:  Amlodipine (Norvasc)  Doxycycline  Fenofibrate  Iron (Ferrous Sulfate)  Meloxicam  Multivitamin  Pantoprazole (Protonix)  Polyethylene Glycol  Prednisolone  Sennosides  Timolol  Umeclidinium (Anoro Ellipta)  Vilanterol (Anoro Ellipta) ==================================================================== For clinical  consultation, please call [phone]. ====================================================================    Laboratory Chemistry Profile   Renal Lab Results  Component Value Date   BUN 12 09/27/2019   CREATININE 0.85 09/27/2019   BCR 28 (H) 07/10/2018   GFRAA >60 09/27/2019   GFRNONAA >60 09/27/2019     Hepatic Lab Results  Component Value Date   AST 32 09/25/2019   ALT 27 09/25/2019   ALBUMIN 3.5 09/25/2019   ALKPHOS 45 09/25/2019   LIPASE 24 06/04/2018   AMMONIA 25 09/13/2019     Electrolytes Lab Results  Component Value Date   NA 142 09/27/2019   K 3.6 09/27/2019   CL 112 (H) 09/27/2019   CALCIUM 8.7 (L) 09/27/2019   MG 2.3 04/02/2019   PHOS 3.4 03/30/2019     Bone Lab Results  Component Value Date   VD25OH 26 08/15/2016   25OHVITD1 34 07/10/2018   25OHVITD2 <1.0 07/10/2018   25OHVITD3 34 07/10/2018     Inflammation (CRP: Acute Phase) (ESR: Chronic Phase) Lab Results  Component Value Date   CRP 5 07/10/2018   ESRSEDRATE 20 07/10/2018   LATICACIDVEN 1.1 09/26/2019       Note: Above Lab results reviewed.  Imaging  DG Chest Portable 1 View CLINICAL DATA:  Shortness of breath  EXAM: PORTABLE CHEST 1 VIEW  COMPARISON:  09/13/2019  FINDINGS: Low lung volumes. Bilateral lower lobe airspace opacities, left greater than right. Mild cardiomegaly. No effusions. No  acute bony abnormality.  IMPRESSION: Bilateral lower lobe opacities, left greater than right concerning for pneumonia.  Mild cardiomegaly.  Electronically Signed   By: Rolm Baptise M.D.   On: 09/25/2019 18:37  Assessment  The primary encounter diagnosis was Chronic pain syndrome. Diagnoses of Chronic knee pain (Primary Area of Pain) (Bilateral) (L>R), Chronic low back pain (Bilateral) w/o sciatica, DISH (diffuse idiopathic skeletal hyperostosis), Pharmacologic therapy, Neurogenic pain, Osteoarthritis of knee (Bilateral), and Primary osteoarthritis involving multiple joints were  also pertinent to this visit.  Plan of Care  Problem-specific:  No problem-specific Assessment & Plan notes found for this encounter.  Aaron Gibbs has a current medication list which includes the following long-term medication(s): albuterol, amlodipine, duloxetine, fenofibrate, ferrous sulfate, meloxicam, [START ON 10/24/2019] oxycodone hcl, [START ON 11/23/2019] oxycodone hcl, [START ON 12/23/2019] oxycodone hcl, pantoprazole, pregabalin, risperidone, and trazodone.  Pharmacotherapy (Medications Ordered): Meds ordered this encounter  Medications  . Oxycodone HCl 10 MG TABS    Sig: Take 1 tablet (10 mg total) by mouth 3 (three) times daily as needed. Must last 30 days.    Dispense:  90 tablet    Refill:  0    Chronic Pain: STOP Act (Not applicable) Fill 1 day early if closed on refill date. Do not fill until: 10/24/2019. To last until: 11/23/2019. Avoid benzodiazepines within 8 hours of opioids  . Oxycodone HCl 10 MG TABS    Sig: Take 1 tablet (10 mg total) by mouth 3 (three) times daily as needed. Must last 30 days.    Dispense:  90 tablet    Refill:  0    Chronic Pain: STOP Act (Not applicable) Fill 1 day early if closed on refill date. Do not fill until: 11/23/2019. To last until: 12/23/2019. Avoid benzodiazepines within 8 hours of opioids  . Oxycodone HCl 10 MG TABS    Sig: Take 1 tablet (10 mg total) by mouth 3 (three) times daily as needed. Must last 30 days.    Dispense:  90 tablet    Refill:  0    Chronic Pain: STOP Act (Not applicable) Fill 1 day early if closed on refill date. Do not fill until: 12/23/2019. To last until: 01/22/2020. Avoid benzodiazepines within 8 hours of opioids  . pregabalin (LYRICA) 150 MG capsule    Sig: Take 1 capsule (150 mg total) by mouth 3 (three) times daily. Must last 30 days    Dispense:  90 capsule    Refill:  5    Fill one day early if pharmacy is closed on scheduled refill date. May substitute for generic if available.  . meloxicam (MOBIC) 15 MG tablet     Sig: Take 1 tablet (15 mg total) by mouth daily.    Dispense:  30 tablet    Refill:  5    Fill one day early if pharmacy is closed on scheduled refill date. May substitute for generic if available.   Orders:  Orders Placed This Encounter  Procedures  . ToxASSURE Select 13 (MW), Urine    Volume: 30 ml(s). Minimum 3 ml of urine is needed. Document temperature of fresh sample. Indications: Long term (current) use of opiate analgesic (X72.620)    Order Specific Question:   Release to patient    Answer:   Immediate   Follow-up plan:   Return in about 3 months (around 01/21/2020) for (F2F), (MM).      Interventional management options: Considering: Palliative treatments seen below.   PRN Procedures: Palliative bilateral IA  Hyalgan knee injections S3/N1  Palliative bilateral IA knee injections (w/ steroids) #1  Diagnostic bilateral genicular NB #2  Palliative bilateral genicular nerve RFA #2 (last done 06/10/2019)    Recent Visits Date Type Provider Dept  07/21/19 Telemedicine Milinda Pointer, MD [facility]  Showing recent visits within past 90 days and meeting all other requirements   Today's Visits Date Type Provider Dept  10/14/19 Telemedicine Milinda Pointer, MD [facility]  Showing today's visits and meeting all other requirements   Future Appointments No visits were found meeting these conditions.  Showing future appointments within next 90 days and meeting all other requirements   I discussed the assessment and treatment plan with the patient. The patient was provided an opportunity to ask questions and all were answered. The patient agreed with the plan and demonstrated an understanding of the instructions.  Patient advised to call back or seek an in-person evaluation if the symptoms or condition worsens.  Duration of encounter: 13 minutes.  Note by: Gaspar Cola, MD Date: 10/14/2019; Time: 12:48 PM

## 2019-10-13 NOTE — Telephone Encounter (Signed)
LM for patient to call office

## 2019-10-14 ENCOUNTER — Ambulatory Visit: Payer: Medicare Other | Attending: Pain Medicine | Admitting: Pain Medicine

## 2019-10-14 ENCOUNTER — Other Ambulatory Visit: Payer: Self-pay

## 2019-10-14 DIAGNOSIS — M545 Low back pain: Secondary | ICD-10-CM | POA: Diagnosis not present

## 2019-10-14 DIAGNOSIS — M8949 Other hypertrophic osteoarthropathy, multiple sites: Secondary | ICD-10-CM

## 2019-10-14 DIAGNOSIS — M25561 Pain in right knee: Secondary | ICD-10-CM | POA: Diagnosis not present

## 2019-10-14 DIAGNOSIS — G8929 Other chronic pain: Secondary | ICD-10-CM

## 2019-10-14 DIAGNOSIS — G894 Chronic pain syndrome: Secondary | ICD-10-CM

## 2019-10-14 DIAGNOSIS — M792 Neuralgia and neuritis, unspecified: Secondary | ICD-10-CM

## 2019-10-14 DIAGNOSIS — M4810 Ankylosing hyperostosis [Forestier], site unspecified: Secondary | ICD-10-CM

## 2019-10-14 DIAGNOSIS — M25562 Pain in left knee: Secondary | ICD-10-CM

## 2019-10-14 DIAGNOSIS — Z79899 Other long term (current) drug therapy: Secondary | ICD-10-CM

## 2019-10-14 DIAGNOSIS — M159 Polyosteoarthritis, unspecified: Secondary | ICD-10-CM

## 2019-10-14 DIAGNOSIS — M170 Bilateral primary osteoarthritis of knee: Secondary | ICD-10-CM

## 2019-10-14 MED ORDER — OXYCODONE HCL 10 MG PO TABS: 10.0000 mg | ORAL_TABLET | Freq: Three times a day (TID) | ORAL | 0 refills | Status: DC | PRN

## 2019-10-14 MED ORDER — MELOXICAM 15 MG PO TABS: 15.0000 mg | ORAL_TABLET | Freq: Every day | ORAL | 5 refills | Status: DC

## 2019-10-14 MED ORDER — PREGABALIN 150 MG PO CAPS: 150.0000 mg | ORAL_CAPSULE | Freq: Three times a day (TID) | ORAL | 5 refills | Status: DC

## 2019-10-16 ENCOUNTER — Ambulatory Visit: Payer: Self-pay | Admitting: General Practice

## 2019-10-16 ENCOUNTER — Telehealth: Payer: Medicare Other | Admitting: General Practice

## 2019-10-16 DIAGNOSIS — J432 Centrilobular emphysema: Secondary | ICD-10-CM | POA: Diagnosis not present

## 2019-10-16 DIAGNOSIS — I10 Essential (primary) hypertension: Secondary | ICD-10-CM

## 2019-10-16 DIAGNOSIS — G894 Chronic pain syndrome: Secondary | ICD-10-CM

## 2019-10-16 DIAGNOSIS — J441 Chronic obstructive pulmonary disease with (acute) exacerbation: Secondary | ICD-10-CM | POA: Diagnosis not present

## 2019-10-16 DIAGNOSIS — F333 Major depressive disorder, recurrent, severe with psychotic symptoms: Secondary | ICD-10-CM | POA: Diagnosis not present
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
## 2019-10-20 ENCOUNTER — Telehealth: Payer: Medicare Other | Admitting: Pain Medicine

## 2019-10-22 ENCOUNTER — Other Ambulatory Visit: Payer: Self-pay | Admitting: Family Medicine

## 2019-10-22 ENCOUNTER — Ambulatory Visit (INDEPENDENT_AMBULATORY_CARE_PROVIDER_SITE_OTHER): Payer: Medicare Other | Admitting: Pharmacist

## 2019-10-22 DIAGNOSIS — J432 Centrilobular emphysema: Secondary | ICD-10-CM

## 2019-10-22 DIAGNOSIS — F333 Major depressive disorder, recurrent, severe with psychotic symptoms: Secondary | ICD-10-CM

## 2019-10-22 MED ORDER — RISPERIDONE 0.5 MG PO TABS: 0.5000 mg | ORAL_TABLET | Freq: Two times a day (BID) | ORAL | 0 refills | Status: DC

## 2019-10-22 MED ORDER — QUETIAPINE FUMARATE 100 MG PO TABS: 100.0000 mg | ORAL_TABLET | Freq: Every day | ORAL | 0 refills | Status: DC

## 2019-10-22 NOTE — Telephone Encounter (Signed)
Notified by [name] patient needs new psychiatrist, no longer seen at [facility] due to no shows. Will need meds temporarily. I would still expect him to establish with new geri psych provider for long term management of his severe depression. Sent [facility] 3 month fill until patient can re-establish with new psychiatry, quetiapine 100 nightly and risperidone 0.5 BID  [name], DO [facility] 10/22/2019, 1:32 PM

## 2019-10-22 NOTE — Patient Instructions (Signed)
Thank you allowing the Chronic Care Management Team to be a part of your care! It was a pleasure speaking with you today!     CCM (Chronic Care Management) Team    Noreene Larsson RN, MSN, CCM Nurse Care Coordinator  [phone]   [name] PharmD  Clinical Pharmacist  [phone]   Eula Fried LCSW Clinical Social Worker [phone]  Visit Information  Goals Addressed            This Visit's Progress   . PharmD - Medication Review       CARE PLAN ENTRY (see longitudinal plan of care for additional care plan information)   Current Barriers:  . Chronic Disease Management support, education, and care coordination needs related to COPD, HTN, GERD, depression and chronic pain syndrome . 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  Interventions: . Inter-disciplinary care team collaboration (see longitudinal plan of care) . Comprehensive medication review performed; medication list updated in electronic medical record o Counsel regarding risk of sedation/dizziness with oxycodone, Lyrica, risperidone and trazodone, particularly when taken in combination - Patient denies current issues with sedation/dizziness - Reports has been needing oxycodone less (as needed) since received nerve ablation in knees by Dr. Dossie Arbour - Denies current constipation with oxycodone o Reports needing a refill of risperidone and quetiapine prescriptions. Reports these prescriptions previously managed by psychiatrist at [facility], but notified yesterday that he was discharged from [facility] due to no show policy. - Reports ran out of risperidone. States was previously rationing the medication until could obtain a new Rx. - Collaborate with PCP regarding medication refills and recommendations for new psychiatrist practice for  patient - Perform review. Note patient admitted to [facility] 3/27-3/29 with syncope and hallucinations.  o Review dishcarge summary from 3/29 with patient. Patient confirms stopped Wellbutrin at discharge and reports that he will stop Seroquel as was instructed - Follow up with PCP regarding discontinued quetiapine and confirm okay to call [facility] to have Rx discontinued o Identify patient in need of refill of Anoro inhaler. Reports will obtain from pharmacy . Place coordination of care call to Sam at [facility] for discontinuation of quetiapine Rx. Sam confirms he will discontinue quetiapine 100 mg prescription. Myles Rosenthal on strategies for management of GERD symptoms o Reports GERD currently well controlled with pantoprazole 40 mg once daiy o Expresses interest in tapering down/off pantoprazole in future o Will discuss during next phone appointment . Counsel patient on importance of medication adherence and tools to aid with adherence. o Patient expresses interest in pill packaging, particularly as this service is offered by his current pharmacy and states that he will follow up with [facility] to get the pill packaging started. . Coordination of care - Encourage patient to follow up regarding establishing care with new psychiatrist o Reports will contact [facility] as recommended by PCP o Reports will also reach out to [employer] if needed for additional in-network recommendations  Patient Self Care Activities:  . Attends all scheduled provider appointments . Calls pharmacy for medication refills . Calls provider office for new concerns or questions   Initial goal documentation        Patient verbalizes understanding of instructions provided today.   Telephone follow up appointment with care management team member scheduled for: 7/7 at 1  pm  [name], PharmD, [place] [phone]

## 2019-10-22 NOTE — Chronic Care Management (AMB) (Signed)
Chronic Care Management   Note  10/22/2019 Name: Aaron Gibbs MRN: [id number] DOB: 05-28-1935   Subjective:   Aaron Gibbs is Gibbs 85 y.o. year old male who is Gibbs primary care patient of Aaron Gibbs. The CM team was consulted for assistance with chronic disease management and care coordination.   I reached out to Aaron Gibbs by phone today.   Review of patient status, including review of consultants reports, laboratory and other test data, was performed as part of comprehensive evaluation and provision of chronic care management services.   Objective:  Lab Results  Component Value Date   CREATININE 0.85 09/27/2019   CREATININE 1.09 09/26/2019   CREATININE 1.19 09/25/2019    Lab Results  Component Value Date   HGBA1C 5.9 (H) 02/14/2019       Component Value Date/Time   TRIG 228 (H) 03/30/2019 0324    BP Readings from Last 3 Encounters:  09/27/19 (!) 137/54  09/15/19 (!) 106/55  09/05/19 105/66    Allergies  Allergen Reactions  . Azithromycin Shortness Of Breath    Medications Reviewed Today    Reviewed by Aaron Gibbs, [place] (Pharmacist) on 10/22/19 at 1408  Med List Status: <None>  Medication Order Taking? Sig Documenting Provider Last Dose Status Informant  albuterol (VENTOLIN HFA) 108 (90 Base) MCG/ACT inhaler [id number] Yes INHALE 1-2 PUFFS INTO THE LUNGS EVERY 6 HOURS AS NEEDED Aaron Gibbs Taking Active Other  amLODipine (NORVASC) 10 MG tablet [id number] Yes Take 10 mg by mouth daily. [provider] Taking Active Other  aspirin EC 81 MG tablet [id number]  Take 81 mg by mouth daily. [provider]  Active Other  bacitracin ophthalmic ointment [id number] No 1 application as needed. Apply to affected eye [provider] Not Taking Active Other  diclofenac sodium (VOLTAREN) 1 % GEL 0000000 Yes Apply 1 application topically as needed. [provider] Taking Active Other  DULoxetine  (CYMBALTA) 60 MG capsule [id number] Yes Take 1 capsule (60 mg total) by mouth daily. Aaron Gibbs Taking Active Other  fenofibrate (TRICOR) 145 MG tablet [id number] Yes Take 145 mg by mouth daily. [provider] Taking Active Other  ferrous sulfate 325 (65 FE) MG tablet [id number] Yes Take 325 mg by mouth daily with breakfast. [provider] Taking Active Other  meloxicam (MOBIC) 15 MG tablet [id number] Yes Take 1 tablet (15 mg total) by mouth daily. Aaron Pointer, MD Taking Active   Multiple Vitamin (MULTIVITAMIN WITH MINERALS) TABS tablet [id number] Yes Take 1 tablet by mouth daily. [provider] Taking Active Other  Oxycodone HCl 10 MG TABS [id number] Yes Take 1 tablet (10 mg total) by mouth 3 (three) times daily as needed. Must last 30 days. Aaron Pointer, MD Taking Active            Med Note Aaron Gibbs, Aaron Gibbs   Tue Oct 14, 2019 12:47 PM) FUTURE Prescription. (NOT Gibbs DUPLICATE!!) >>>Gibbs NOT DELETE<<< (even if Expired!) See Care Coordination Note from [facility] (Aaron. Dossie Gibbs)  Oxycodone HCl 10 MG TABS [id number]  Take 1 tablet (10 mg total) by mouth 3 (three) times daily as needed. Must last 30 days. Aaron Pointer, MD  Active            Med Note Aaron Gibbs, [place] Gibbs   Tue Oct 14, 2019 12:47 PM) FUTURE Prescription. (NOT Gibbs DUPLICATE!!) >>>Gibbs NOT DELETE<<< (even if Expired!) See Care Coordination Note from [facility] (Aaron. Dossie Gibbs)  Oxycodone HCl  10 MG TABS [id number]  Take 1 tablet (10 mg total) by mouth 3 (three) times daily as needed. Must last 30 days. Aaron Pointer, MD  Active            Med Note Aaron Gibbs, Abram Gibbs   Tue Oct 14, 2019 12:47 PM) FUTURE Prescription. (NOT Gibbs DUPLICATE!!) >>>Gibbs NOT DELETE<<< (even if Expired!) See Care Coordination Note from [facility] (Aaron. Dossie Gibbs)  pantoprazole (PROTONIX) 40 MG tablet [id number] Yes Take 40 mg by mouth daily.  [provider] Taking Active Other    prednisoLONE acetate (PRED FORTE) 1 % ophthalmic suspension [id number] No Place 2 drops into the left eye daily.  Patient not taking: Reported on 10/22/2019   Aaron Lesches, MD Not Taking Active Other  pregabalin (LYRICA) 150 MG capsule [id number] Yes Take 1 capsule (150 mg total) by mouth 3 (three) times daily. Must last 30 days Aaron Pointer, MD Taking Active            Med Note Aaron Gibbs, [place] Gibbs   Tue Oct 14, 2019 12:47 PM) FUTURE Prescription. (NOT Gibbs DUPLICATE!!) >>>Gibbs NOT DELETE<<< (even if Expired!) See Care Coordination Note from [facility] (Aaron. Dossie Gibbs)  risperiDONE (RISPERDAL) 0.5 MG tablet [id number]  Take 1 tablet (0.5 mg total) by mouth 2 (two) times daily. Aaron Gibbs  Active   timolol (TIMOPTIC) 0.5 % ophthalmic solution [id number] Yes 1 drop 2 (two) times daily. Apply one drop to eye(s) [provider] Taking Active Other  traZODone (DESYREL) 50 MG tablet [id number] Yes TAKE 1 TABLET BY MOUTH AT BEDTIME Aaron Gibbs Taking Active Other  umeclidinium-vilanterol (ANORO ELLIPTA) 62.5-25 MCG/INH AEPB [id number] Yes Inhale 1 puff into the lungs daily. [provider] Taking Active Other  Med List Note Aaron Billow, RN 06/10/19 1408): UDS 07/10/18 MR 09/24/2019           Assessment:   Goals Addressed            This Visit's Progress   . PharmD - Medication Review       CARE PLAN ENTRY (see longitudinal plan of care for additional care plan information)   Current Barriers:  . Chronic Disease Management support, education, and care coordination needs related to COPD, HTN, GERD, depression and chronic pain syndrome . 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  Interventions: . Inter-disciplinary care team collaboration (see  longitudinal plan of care) . Comprehensive medication review performed; medication list updated in electronic medical record o Counsel regarding risk of sedation/dizziness with oxycodone, Lyrica, risperidone and trazodone, particularly when taken in combination - Patient denies current issues with sedation/dizziness - Reports has been needing oxycodone less (as needed) since received nerve ablation in knees by Aaron. Dossie Gibbs - Denies current constipation with oxycodone o Reports needing Gibbs refill of risperidone and quetiapine prescriptions. Reports these prescriptions previously managed by psychiatrist at [facility], but notified yesterday that he was discharged from [facility] due to no show policy. - Reports ran out of risperidone. States was previously rationing the medication until could obtain Gibbs new Rx. - Collaborate with PCP regarding medication refills and recommendations for new psychiatrist practice for patient o Perform review. Note patient admitted to [facility] 3/27-3/29 with syncope and hallucinations. At discharge, patient to - STOP Wellbutrin - STOP Seroquel - Continue Cymbalta 60  mg once daily - START risperidone 0.5 mg twice daily o Review dishcarge summary from 3/29 with patient. Patient confirms stopped Wellbutrin at discharge and reports that he will stop Seroquel as was instructed - Follow up with PCP regarding discontinued quetiapine and confirm okay to call [facility] to have Rx discontinued o Identify patient in need of refill of Anoro inhaler. Reports will obtain from pharmacy . Place coordination of care call to Sam at [place] for discontinuation of quetiapine Rx. Sam confirms he will discontinue quetiapine 100 mg prescription. Myles Rosenthal on strategies for management of GERD symptoms o Reports GERD currently well controlled with pantoprazole 40 mg once daiy o Expresses interest in tapering down/off pantoprazole in future o Will discuss during next phone  appointment . Counsel patient on importance of medication adherence and tools to aid with adherence. o Patient expresses interest in pill packaging, particularly as this service is offered by his current pharmacy and states that he will follow up with [facility] to get the pill packaging started. . Coordination of care - Encourage patient to follow up regarding establishing care with new psychiatrist o Reports will contact [facility] as recommended by PCP o Reports will also reach out to [employer] if needed for additional in-network recommendations  Patient Self Care Activities:  . Attends all scheduled provider appointments . Calls pharmacy for medication refills . Calls provider office for new concerns or questions   Initial goal documentation        Plan:  Telephone follow up appointment with care management team member scheduled for: 7/7 at 1 pm  [name], PharmD, [place] [phone]

## 2019-11-18 ENCOUNTER — Other Ambulatory Visit: Payer: Self-pay | Admitting: Family Medicine

## 2019-11-18 DIAGNOSIS — I10 Essential (primary) hypertension: Secondary | ICD-10-CM

## 2019-11-18 DIAGNOSIS — E782 Mixed hyperlipidemia: Secondary | ICD-10-CM

## 2019-11-18 MED ORDER — AMLODIPINE BESYLATE 10 MG PO TABS: 10.0000 mg | ORAL_TABLET | Freq: Every day | ORAL | 1 refills | Status: DC

## 2019-11-18 MED ORDER — FENOFIBRATE 145 MG PO TABS: 145.0000 mg | ORAL_TABLET | Freq: Every day | ORAL | 1 refills | Status: DC

## 2019-11-18 NOTE — Telephone Encounter (Signed)
[place], [place] [address]  Phone: [phone] Fax: [phone]   fenofibrate (TRICOR) 145 MG tablet [id number] amLODipine (NORVASC) 10 MG tablet [id number]  PT needs a refill

## 2019-11-18 NOTE — Telephone Encounter (Signed)
Requested medication (s) are due for refill today - unknown  Requested medication (s) are on the active medication list -yes  Future visit scheduled -no  Last refill: unknown  Notes to clinic: Request for prescriptions listed as historical.  Requested Prescriptions  Pending Prescriptions Disp Refills   fenofibrate (TRICOR) 145 MG tablet      Sig: Take 1 tablet (145 mg total) by mouth daily.      Cardiovascular:  Antilipid - Fibric Acid Derivatives Failed - 11/18/2019  1:57 PM      Failed - Total Cholesterol in normal range and within 360 days    No results found for: CHOL, POCCHOL, CHOLTOT        Failed - LDL in normal range and within 360 days    No results found for: LDLCALC, LDLC, HIRISKLDL, POCLDL, LDLDIRECT, REALLDLC, TOTLDLC        Failed - HDL in normal range and within 360 days    No results found for: HDL, POCHDL        Failed - Triglycerides in normal range and within 360 days    Triglycerides  Date Value Ref Range Status  03/30/2019 228 (H) <150 mg/dL Final    Comment:    Performed at [facility], [address] [place],  [address]          Passed - ALT in normal range and within 180 days    ALT  Date Value Ref Range Status  09/25/2019 27 0 - 44 U/L Final          Passed - AST in normal range and within 180 days    AST  Date Value Ref Range Status  09/25/2019 32 15 - 41 U/L Final          Passed - Cr in normal range and within 180 days    Creatinine, Ser  Date Value Ref Range Status  09/27/2019 0.85 0.61 - 1.24 mg/dL Final          Passed - eGFR in normal range and within 180 days    GFR calc Af Amer  Date Value Ref Range Status  09/27/2019 >60 >60 mL/min Final   GFR calc non Af Amer  Date Value Ref Range Status  09/27/2019 >60 >60 mL/min Final          Passed - Valid encounter within last 12 months    Recent Outpatient Visits           2 months ago Severe episode of recurrent major depressive disorder, without  psychotic features ([place])   [facility], Devonne Doughty, DO   5 months ago Severe episode of recurrent major depressive disorder, without psychotic features ([place])   [name] Olin Hauser, DO   6 months ago Acute exacerbation of chronic obstructive pulmonary disease (COPD) ([place])   [facility] Olin [facility]er, DO   7 months ago Acute on chronic respiratory failure with hypoxia [facility])   [facility] Olin Hauser, DO   9 months ago COPD with acute exacerbation [facility])   [facility], Devonne Doughty, DO                amLODipine (NORVASC) 10 MG tablet      Sig: Take 1 tablet (10 mg total) by mouth daily.      Cardiovascular:  Calcium Channel Blockers Passed - 11/18/2019  1:57 PM  Passed - Last BP in normal range    BP Readings from Last 1 Encounters:  09/27/19 (!) 137/54          Passed - Valid encounter within last 6 months    Recent Outpatient Visits           2 months ago Severe episode of recurrent major depressive disorder, without psychotic features ([place])   [facility], Devonne Doughty, DO   5 months ago Severe episode of recurrent major depressive disorder, without psychotic features ([place])   [facility] Olin Hauser, DO   6 months ago Acute exacerbation of chronic obstructive pulmonary disease (COPD) ([place])   [facility] Olin Hauser, DO   7 months ago Acute on chronic respiratory failure with hypoxia [facility])   [facility] Olin Hauser, DO   9 months ago COPD with acute exacerbation [facility])   [place], DO                  Requested Prescriptions  Pending Prescriptions Disp Refills   fenofibrate (TRICOR) 145 MG tablet      Sig: Take 1 tablet (145 mg total) by mouth daily.       Cardiovascular:  Antilipid - Fibric Acid Derivatives Failed - 11/18/2019  1:57 PM      Failed - Total Cholesterol in normal range and within 360 days    No results found for: CHOL, POCCHOL, CHOLTOT        Failed - LDL in normal range and within 360 days    No results found for: LDLCALC, LDLC, HIRISKLDL, POCLDL, LDLDIRECT, REALLDLC, TOTLDLC        Failed - HDL in normal range and within 360 days    No results found for: HDL, POCHDL        Failed - Triglycerides in normal range and within 360 days    Triglycerides  Date Value Ref Range Status  03/30/2019 228 (H) <150 mg/dL Final    Comment:    Performed at [facility], [address] [place], [place] [address]          Passed - ALT in normal range and within 180 days    ALT  Date Value Ref Range Status  09/25/2019 27 0 - 44 U/L Final          Passed - AST in normal range and within 180 days    AST  Date Value Ref Range Status  09/25/2019 32 15 - 41 U/L Final          Passed - Cr in normal range and within 180 days    Creatinine, Ser  Date Value Ref Range Status  09/27/2019 0.85 0.61 - 1.24 mg/dL Final          Passed - eGFR in normal range and within 180 days    GFR calc Af Amer  Date Value Ref Range Status  09/27/2019 >60 >60 mL/min Final   GFR calc non Af Amer  Date Value Ref Range Status  09/27/2019 >60 >60 mL/min Final          Passed - Valid encounter within last 12 months    Recent Outpatient Visits           2 months ago Severe episode of recurrent major depressive disorder, without psychotic features [facility])   [place], DO   5  months ago Severe episode of recurrent major depressive disorder, without psychotic features [facility])   [facility] [place], Devonne Doughty, DO   6 months ago Acute exacerbation of chronic obstructive pulmonary disease (COPD) [facility])   [facility], Devonne Doughty, DO   7 months ago  Acute on chronic respiratory failure with hypoxia [facility])   [facility] Olin Hauser, DO   9 months ago COPD with acute exacerbation [facility])   [facility], Devonne Doughty, DO                amLODipine (NORVASC) 10 MG tablet      Sig: Take 1 tablet (10 mg total) by mouth daily.      Cardiovascular:  Calcium Channel Blockers Passed - 11/18/2019  1:57 PM      Passed - Last BP in normal range    BP Readings from Last 1 Encounters:  09/27/19 (!) 137/54          Passed - Valid encounter within last 6 months    Recent Outpatient Visits           2 months ago Severe episode of recurrent major depressive disorder, without psychotic features ([place])   [facility] [place], Devonne Doughty, DO   5 months ago Severe episode of recurrent major depressive disorder, without psychotic features [facility])   [facility] Olin Hauser, DO   6 months ago Acute exacerbation of chronic obstructive pulmonary disease (COPD) [facility])   [facility] Olin Hauser, DO   7 months ago Acute on chronic respiratory failure with hypoxia [facility])   [facility] Olin Hauser, DO   9 months ago COPD with acute exacerbation [facility])   [facility] [place], Devonne Doughty, DO

## 2019-11-20 ENCOUNTER — Other Ambulatory Visit: Payer: Self-pay | Admitting: Family Medicine

## 2019-11-20 DIAGNOSIS — F333 Major depressive disorder, recurrent, severe with psychotic symptoms: Secondary | ICD-10-CM

## 2019-11-20 DIAGNOSIS — F332 Major depressive disorder, recurrent severe without psychotic features: Secondary | ICD-10-CM

## 2019-11-20 NOTE — Telephone Encounter (Signed)
Requested medication (s) are due for refill today: yes  Requested medication (s) are on the active medication list: yes  Last refill:  10/22/19  Future visit scheduled: no  Notes to clinic:  not delegated    Requested Prescriptions  Pending Prescriptions Disp Refills   risperiDONE (RISPERDAL) 0.5 MG tablet [Pharmacy Med Name: RISPERIDONE 0.5 MG TAB] 180 tablet 0    Sig: TAKE 1 TABLET BY MOUTH TWICE DAILY      Not Delegated - Psychiatry:  Antipsychotics - Second Generation (Atypical) - risperidone Failed - 11/20/2019  1:56 PM      Failed - This refill cannot be delegated      Failed - Prolactin Level (serum) in normal range and within 180 days    No results found for: PROLACTIN, TOTPROLACTIN, LABPROL        Passed - ALT in normal range and within 180 days    ALT  Date Value Ref Range Status  09/25/2019 27 0 - 44 U/L Final          Passed - AST in normal range and within 180 days    AST  Date Value Ref Range Status  09/25/2019 32 15 - 41 U/L Final          Passed - Valid encounter within last 6 months    Recent Outpatient Visits           2 months ago Severe episode of recurrent major depressive disorder, without psychotic features ([place])   [facility], Devonne Doughty, DO   5 months ago Severe episode of recurrent major depressive disorder, without psychotic features ([place])   [facility] Olin Hauser, DO   6 months ago Acute exacerbation of chronic obstructive pulmonary disease (COPD) ([place])   [facility] Olin Hauser, DO   7 months ago Acute on chronic respiratory failure with hypoxia [facility])   [facility], Devonne Doughty, DO   9 months ago COPD with acute exacerbation [facility])   [facility], Devonne Doughty, DO                buPROPion [facility] SR) 150 MG 12 hr tablet [Pharmacy Med Name: BUPROPION HCL ER (SR) 150 MG TAB] 180 tablet 1   Sig: TAKE 1 TABLET BY MOUTH TWICE DAILY      Psychiatry: Antidepressants - bupropion Passed - 11/20/2019  1:56 PM      Passed - Completed PHQ-2 or PHQ-9 in the last 360 days.      Passed - Last BP in normal range    BP Readings from Last 1 Encounters:  09/27/19 (!) 137/54          Passed - Valid encounter within last 6 months    Recent Outpatient Visits           2 months ago Severe episode of recurrent major depressive disorder, without psychotic features ([name])   [facility], Devonne Doughty, DO   5 months ago Severe episode of recurrent major depressive disorder, without psychotic features [facility])   [facility] Olin Hauser, DO   6 months ago Acute exacerbation of chronic obstructive pulmonary disease (COPD) [facility])   [facility] Olin Hauser, DO   7 months ago Acute on chronic respiratory failure with hypoxia [facility])   [place], DO   9 months ago COPD with acute exacerbation (  [facility])   [place], DO

## 2019-11-27 ENCOUNTER — Ambulatory Visit (INDEPENDENT_AMBULATORY_CARE_PROVIDER_SITE_OTHER): Payer: Medicare Other | Admitting: Licensed Clinical Social Worker

## 2019-11-27 DIAGNOSIS — F332 Major depressive disorder, recurrent severe without psychotic features: Secondary | ICD-10-CM

## 2019-11-27 DIAGNOSIS — I10 Essential (primary) hypertension: Secondary | ICD-10-CM

## 2019-11-27 DIAGNOSIS — G3184 Mild cognitive impairment, so stated: Secondary | ICD-10-CM

## 2019-11-27 DIAGNOSIS — J441 Chronic obstructive pulmonary disease with (acute) exacerbation: Secondary | ICD-10-CM

## 2019-11-27 DIAGNOSIS — F333 Major depressive disorder, recurrent, severe with psychotic symptoms: Secondary | ICD-10-CM

## 2019-11-27 NOTE — Chronic Care Management (AMB) (Signed)
Chronic Care Management    Clinical Social Work Follow Up Note  11/27/2019 Name: Aaron Gibbs MRN: [id number] DOB: 11-May-1935  Aaron Gibbs is a 85 y.o. year old male who is a primary care patient of Olin Hauser, DO. The CCM team was consulted for assistance with Mental Health Counseling and Resources.   Review of patient status, including review of consultants reports, other relevant assessments, and collaboration with appropriate care team members and the patient's provider was performed as part of comprehensive patient evaluation and provision of chronic care management services.    SDOH (Social Determinants of Health) assessments performed: Yes    Outpatient Encounter Medications as of 11/27/2019  Medication Sig Note  . albuterol (VENTOLIN HFA) 108 (90 Base) MCG/ACT inhaler INHALE 1-2 PUFFS INTO THE LUNGS EVERY 6 HOURS AS NEEDED   . amLODipine (NORVASC) 10 MG tablet Take 1 tablet (10 mg total) by mouth daily.   Marland Kitchen aspirin EC 81 MG tablet Take 81 mg by mouth daily.   . bacitracin ophthalmic ointment 1 application as needed. Apply to affected eye   . buPROPion (WELLBUTRIN SR) 150 MG 12 hr tablet TAKE 1 TABLET BY MOUTH TWICE DAILY   . diclofenac sodium (VOLTAREN) 1 % GEL Apply 1 application topically as needed.   . DULoxetine (CYMBALTA) 60 MG capsule Take 1 capsule (60 mg total) by mouth daily.   . fenofibrate (TRICOR) 145 MG tablet Take 1 tablet (145 mg total) by mouth daily.   . ferrous sulfate 325 (65 FE) MG tablet Take 325 mg by mouth daily with breakfast.   . meloxicam (MOBIC) 15 MG tablet Take 1 tablet (15 mg total) by mouth daily.   . Multiple Vitamin (MULTIVITAMIN WITH MINERALS) TABS tablet Take 1 tablet by mouth daily.   . Oxycodone HCl 10 MG TABS Take 1 tablet (10 mg total) by mouth 3 (three) times daily as needed. Must last 30 days. 10/14/2019: FUTURE Prescription. (NOT a DUPLICATE!!) >>>DO NOT DELETE<<< (even if Expired!) See Care Coordination Note from [facility]  Pain Management (Dr. Dossie Arbour)  . Oxycodone HCl 10 MG TABS Take 1 tablet (10 mg total) by mouth 3 (three) times daily as needed. Must last 30 days. 10/14/2019: FUTURE Prescription. (NOT a DUPLICATE!!) >>>DO NOT DELETE<<< (even if Expired!) See Care Coordination Note from [facility] Management (Dr. Dossie Arbour)  . [START ON 12/23/2019] Oxycodone HCl 10 MG TABS Take 1 tablet (10 mg total) by mouth 3 (three) times daily as needed. Must last 30 days. 10/14/2019: FUTURE Prescription. (NOT a DUPLICATE!!) >>>DO NOT DELETE<<< (even if Expired!) See Care Coordination Note from [facility] Pain Management (Dr. Dossie Arbour)  . pantoprazole (PROTONIX) 40 MG tablet Take 40 mg by mouth daily.    . prednisoLONE acetate (PRED FORTE) 1 % ophthalmic suspension Place 2 drops into the left eye daily. (Patient not taking: Reported on 10/22/2019)   . pregabalin (LYRICA) 150 MG capsule Take 1 capsule (150 mg total) by mouth 3 (three) times daily. Must last 30 days 10/14/2019: FUTURE Prescription. (NOT a DUPLICATE!!) >>>DO NOT DELETE<<< (even if Expired!) See Care Coordination Note from [facility] Pain Management (Dr. Dossie Arbour)  . risperiDONE (RISPERDAL) 0.5 MG tablet TAKE 1 TABLET BY MOUTH TWICE DAILY   . timolol (TIMOPTIC) 0.5 % ophthalmic solution 1 drop 2 (two) times daily. Apply one drop to eye(s)   . traZODone (DESYREL) 50 MG tablet TAKE 1 TABLET BY MOUTH AT BEDTIME   . umeclidinium-vilanterol (ANORO ELLIPTA) 62.5-25 MCG/INH AEPB Inhale 1 puff into the lungs daily.  No facility-administered encounter medications on file as of 11/27/2019.     Goals Addressed    .  SW: I need more support at this time (pt-stated)        CARE PLAN ENTRY (see longitudinal plan of care for additional care plan information)  Current Barriers:  . Financial constraints related to managing health care . Limited social support . ADL IADL limitations . Social Isolation . Memory Deficits . Inability to perform ADL's independently . Inability to perform IADL's  independently . Transportation barriers . Food insecurity . Mental health concerns  Clinical Social Work Clinical Goal(s):  Marland Kitchen Over the next 120 days, patient will work with SW to address concerns related to gaining appropriate support within the home and getting connected to needed community resources . Over the next 120 days, patient will demonstrate improved health management independence as evidenced by implementing appropriate self-care methods and community resource connections in order to improve overall health and well-being  Interventions: . Inter-disciplinary care team collaboration (see longitudinal plan of care) . Patient interviewed and appropriate assessments performed . Provided patient with information about local community resources that are available to him. [name] Guide was involved int he past and has made appropriate referrals.  Marland Kitchen LCSW provided extensive education on healthy self-care  . [employer] Transportation benefit education provided to patient. Patient wrote down number. Patient does not drive but has a caregiver during the day. Marland Kitchen Possible need for advanced care options as the patients condition worsens due to cognitive impairment. . Discussed plans with patient for ongoing care management follow up and provided patient with direct contact information for care management team . Advised patient to contact [employer]  . Patient reports ongoing chronic knee pain. He denies any recent falls and states that he will contact his pain doctor soon for advice. . Patient was encouraged to consider mental health support at this time. LCSW provided extension resource education and encouraged patient to reach out to [employer] or [employer]. LCSW mailed patient a complete list of available mental health resources within his area on 11/27/19. Patient was appreciative of education.  . Assisted patient/caregiver with obtaining  information about health plan benefits . Provided education and assistance to client regarding Advanced Directives.  . Provided education to patient/caregiver regarding level of care options.  Patient Self Care Activities:  . Calls provider office for new concerns or questions . Lacks social connections  Please see past updates related to this goal by clicking on the "Past Updates" button in the selected goal       Follow Up Plan: SW will follow up with patient by phone over the next quarter  Eula Fried, [place], MSW, [email or web address] Phone: [phone]

## 2019-12-01 ENCOUNTER — Ambulatory Visit: Payer: Self-pay | Admitting: General Practice

## 2019-12-01 ENCOUNTER — Telehealth: Payer: Self-pay | Admitting: General Practice

## 2019-12-01 DIAGNOSIS — F333 Major depressive disorder, recurrent, severe with psychotic symptoms: Secondary | ICD-10-CM

## 2019-12-01 DIAGNOSIS — G894 Chronic pain syndrome: Secondary | ICD-10-CM

## 2019-12-01 DIAGNOSIS — I10 Essential (primary) hypertension: Secondary | ICD-10-CM

## 2019-12-01 DIAGNOSIS — J441 Chronic obstructive pulmonary disease with (acute) exacerbation: Secondary | ICD-10-CM

## 2019-12-01 DIAGNOSIS — F332 Major depressive disorder, recurrent severe without psychotic features: Secondary | ICD-10-CM | POA: Diagnosis not present

## 2019-12-01 DIAGNOSIS — G3184 Mild cognitive impairment, so stated: Secondary | ICD-10-CM
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
## 2019-12-06 ENCOUNTER — Emergency Department: Payer: Medicare Other

## 2019-12-06 ENCOUNTER — Other Ambulatory Visit: Payer: Self-pay

## 2019-12-06 ENCOUNTER — Encounter: Payer: Self-pay | Admitting: Emergency Medicine

## 2019-12-06 ENCOUNTER — Emergency Department
Admission: EM | Admit: 2019-12-06 | Discharge: 2019-12-07 | Disposition: A | Discharge status: Home / Self Care | Payer: Medicare Other | Attending: Emergency Medicine | Admitting: Emergency Medicine

## 2019-12-06 DIAGNOSIS — Z79899 Other long term (current) drug therapy: Secondary | ICD-10-CM | POA: Diagnosis not present

## 2019-12-06 DIAGNOSIS — R0602 Shortness of breath: Secondary | ICD-10-CM | POA: Diagnosis not present

## 2019-12-06 DIAGNOSIS — Z7982 Long term (current) use of aspirin: Secondary | ICD-10-CM | POA: Diagnosis not present

## 2019-12-06 DIAGNOSIS — F410 Panic disorder [episodic paroxysmal anxiety] without agoraphobia: Secondary | ICD-10-CM | POA: Insufficient documentation

## 2019-12-06 DIAGNOSIS — J449 Chronic obstructive pulmonary disease, unspecified: Secondary | ICD-10-CM | POA: Diagnosis not present

## 2019-12-06 DIAGNOSIS — R8290 Unspecified abnormal findings in urine: Secondary | ICD-10-CM | POA: Diagnosis not present

## 2019-12-06 DIAGNOSIS — R918 Other nonspecific abnormal finding of lung field: Secondary | ICD-10-CM | POA: Diagnosis not present

## 2019-12-06 DIAGNOSIS — I10 Essential (primary) hypertension: Secondary | ICD-10-CM | POA: Insufficient documentation

## 2019-12-06 DIAGNOSIS — F419 Anxiety disorder, unspecified: Secondary | ICD-10-CM | POA: Diagnosis present

## 2019-12-06 DIAGNOSIS — Z87891 Personal history of nicotine dependence: Secondary | ICD-10-CM | POA: Insufficient documentation

## 2019-12-06 LAB — CBC WITH DIFFERENTIAL/PLATELET
Abs Immature Granulocytes: 0.02 10*3/uL (ref 0.00–0.07)
Basophils Absolute: 0 10*3/uL (ref 0.0–0.1)
Basophils Relative: 1 %
Eosinophils Absolute: 0.6 10*3/uL — ABNORMAL HIGH (ref 0.0–0.5)
Eosinophils Relative: 14 %
HCT: 38.1 % — ABNORMAL LOW (ref 39.0–52.0)
Hemoglobin: 12.2 g/dL — ABNORMAL LOW (ref 13.0–17.0)
Immature Granulocytes: 1 %
Lymphocytes Relative: 25 %
Lymphs Abs: 1.1 10*3/uL (ref 0.7–4.0)
MCH: 28.8 pg (ref 26.0–34.0)
MCHC: 32 g/dL (ref 30.0–36.0)
MCV: 90.1 fL (ref 80.0–100.0)
Monocytes Absolute: 0.8 10*3/uL (ref 0.1–1.0)
Monocytes Relative: 19 %
Neutro Abs: 1.8 10*3/uL (ref 1.7–7.7)
Neutrophils Relative %: 40 %
Platelets: 168 10*3/uL (ref 150–400)
RBC: 4.23 MIL/uL (ref 4.22–5.81)
RDW: 14.6 % (ref 11.5–15.5)
WBC: 4.4 10*3/uL (ref 4.0–10.5)
nRBC: 0 % (ref 0.0–0.2)

## 2019-12-06 LAB — COMPREHENSIVE METABOLIC PANEL
ALT: 22 U/L (ref 0–44)
AST: 26 U/L (ref 15–41)
Albumin: 3.8 g/dL (ref 3.5–5.0)
Alkaline Phosphatase: 46 U/L (ref 38–126)
Anion gap: 6 (ref 5–15)
BUN: 16 mg/dL (ref 8–23)
CO2: 28 mmol/L (ref 22–32)
Calcium: 9.1 mg/dL (ref 8.9–10.3)
Chloride: 103 mmol/L (ref 98–111)
Creatinine, Ser: 1.13 mg/dL (ref 0.61–1.24)
GFR calc Af Amer: >60 mL/min (ref >60)
GFR calc non Af Amer: >60 mL/min (ref >60)
Glucose, Bld: 107 mg/dL — ABNORMAL HIGH (ref 70–99)
Potassium: 4.3 mmol/L (ref 3.5–5.1)
Sodium: 137 mmol/L (ref 135–145)
Total Bilirubin: 0.7 mg/dL (ref 0.3–1.2)
Total Protein: 6.8 g/dL (ref 6.5–8.1)

## 2019-12-06 LAB — URINALYSIS, COMPLETE (UACMP) WITH MICROSCOPIC
Bilirubin Urine: NEGATIVE
Glucose, UA: NEGATIVE mg/dL
Hgb urine dipstick: NEGATIVE
Ketones, ur: NEGATIVE mg/dL
Nitrite: NEGATIVE
Protein, ur: 30 mg/dL — AB
Specific Gravity, Urine: 1.013 (ref 1.005–1.030)
pH: 8 (ref 5.0–8.0)

## 2019-12-06 LAB — PROTIME-INR
INR: 1 (ref 0.8–1.2)
Prothrombin Time: 12.3 seconds (ref 11.4–15.2)

## 2019-12-06 LAB — TROPONIN I (HIGH SENSITIVITY): Troponin I (High Sensitivity): 8 ng/L (ref <18)

## 2019-12-06 MED ORDER — LORAZEPAM 0.5 MG PO TABS
0.5000 mg | ORAL_TABLET | Freq: Once | ORAL | Status: AC
  Administered 2019-12-06: 0.5 mg via ORAL
  Filled 2019-12-06: qty 1

## 2019-12-06 NOTE — ED Notes (Signed)
Iv team here 

## 2019-12-06 NOTE — ED Notes (Signed)
Cmp, CBC, Type and Screen and Protime INR was collected at 2255, not 2205.

## 2019-12-06 NOTE — ED Triage Notes (Signed)
Pt arrives POV with daughter with c/o anxiety. Pt reports that he believes that he is having an anxiety attack due to rapid heart rate and increased RR per report from pt. Pt is in NAD.

## 2019-12-06 NOTE — ED Notes (Signed)
Pt up out of bed again. Pt assisted to restroom by ed tech.

## 2019-12-06 NOTE — ED Notes (Signed)
Pt will not leave monitoring equipment in place. Multiple times have attempted to educate pt on the importance of leaving equipment in place. Pt continues to states "you just want me hooked up to all this stuff". Pt states was supposed "to get a pill while I wait". Pt is to receive labs before medication is administered. Pt updated, but then is shouting "i'm gonna loose my mind". Pt did not want iv in bend of arms, is waiting on iv team for ultrasound placement of iv in upper arms as requested.

## 2019-12-06 NOTE — ED Provider Notes (Signed)
[facility] Emergency Department Provider Note  ____________________________________________  Time seen: Approximately 8:36 PM  I have reviewed the triage vital signs and the nursing notes.   HISTORY  Chief Complaint Anxiety  History somewhat limited by historian  HPI Aaron Gibbs is a 85 y.o. male who presents the emergency department with a primary complaint of anxiety.  Patient states that he has been exceptionally anxious over the past 4 days.  He states that he is having palpitations and shortness of breath from his anxiety.  He does endorse intermittent chest pain but is unable to describe same, duration.  Not present currently.   Patient is requesting anxiety medication and discharged.  He does not want to discuss other symptoms.  Patient has a new onset rash and states "this is not what is causing me to be anxious, we will need to talk about it."  Does admit to taking increased dosing of his trazodone and Lyrica.  Unsure how much of an increase patient is taking.  Patient does have a history of anxiety, skin cancer, COPD, hyperlipidemia, anemia, depression, encephalopathy, DISH, AVM of colon.  Other than anxiety patient is denying complaints of chronic medical issues.        Past Medical History:  Diagnosis Date  . Anxiety   . Cancer ([place])    skin  . COPD (chronic obstructive pulmonary disease) ([place])   . Glaucoma   . Osteoporosis    osteopenia    Patient Active Problem List   Diagnosis Date Noted  . Aspiration pneumonia ([place]) 09/25/2019  . COPD (chronic obstructive pulmonary disease) ([place])   . HLD (hyperlipidemia)   . Iron deficiency anemia   . Fall   . Acute postoperative pain 06/10/2019  . Medial meniscal tear, sequela (Left) 06/10/2019  . Patellar tendinosis (Right) 06/10/2019  . Acute respiratory failure with hypoxia ([place]) 04/09/2019  . Community acquired pneumonia 03/29/2019  . Lateral meniscal tear, sequela (Left) 03/03/2019  .  Palpitation 02/13/2019  . Preop testing 11/14/2018  . Noncompliance with medication treatment due to overuse of medication 10/23/2018  . Neurogenic pain 08/20/2018  . Atherosclerotic peripheral vascular disease ([place]) 07/24/2018  . Tricompartment osteoarthritis of knee (Left) 07/24/2018  . Osteoarthritis of knee (Bilateral) 07/24/2018  . Osteoarthritis of patellofemoral joint (Right) 07/24/2018  . History of suicide attempt (06/12/18) 07/24/2018  . Long term current use of opiate analgesic 07/10/2018  . Pharmacologic therapy 07/10/2018  . Disorder of skeletal system 07/10/2018  . Problems influencing health status 07/10/2018  . Suicide attempt ([place]) (05/13/18) 06/26/2018  . Acute metabolic encephalopathy 62/83/1517  . 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 Major depressive disorder, recurrent episode, severe ([place]) 06/04/2018  . Blindness 05/10/2018  . Suicidal ideation 05/10/2018  . Chronic pain syndrome 05/01/2018  . DISH (diffuse idiopathic skeletal hyperostosis) 05/01/2018  . Osteoarthritis of multiple joints 05/01/2018  . Chronic knee pain (Primary Area of Pain) (Bilateral) (L>R) 05/01/2018  . Retinitis pigmentosa of both eyes 05/01/2018  . Glaucoma of both eyes 05/01/2018  . Essential hypertension 05/01/2018  . Chronic low back pain (Bilateral) w/o sciatica 05/01/2018  . GERD (gastroesophageal reflux disease) 05/01/2018  . 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 Therapeutic opioid-induced constipation (OIC) 05/01/2018  . Centrilobular emphysema ([place]) 05/01/2018    Past Surgical History:  Procedure Laterality Date  . CHOLECYSTECTOMY    . TRANSURETHRAL RESECTION OF PROSTATE N/A 2008    Prior to Admission medications   Medication Sig Start Date End Date Taking? Authorizing Provider  albuterol (VENTOLIN HFA) 108 (90 Base) MCG/ACT inhaler INHALE 1-2 PUFFS INTO THE LUNGS EVERY 6 HOURS AS NEEDED 09/22/19   Parks Ranger, Devonne Doughty, DO  amLODipine (NORVASC) 10 MG  tablet Take 1 tablet (10 mg total) by mouth daily. 11/18/19   Karamalegos, Devonne Doughty, DO  aspirin EC 81 MG tablet Take 81 mg by mouth daily.    [provider]  bacitracin ophthalmic ointment 1 application as needed. Apply to affected eye 08/01/19   [provider]  buPROPion [facility] SR) 150 MG 12 hr tablet TAKE 1 TABLET BY MOUTH TWICE DAILY 11/20/19   Parks Ranger, Devonne Doughty, DO  diclofenac sodium (VOLTAREN) 1 % GEL Apply 1 application topically as needed. 03/28/19   [provider]  DULoxetine (CYMBALTA) 60 MG capsule Take 1 capsule (60 mg total) by mouth daily. 06/17/19   Karamalegos, Devonne Doughty, DO  fenofibrate (TRICOR) 145 MG tablet Take 1 tablet (145 mg total) by mouth daily. 11/18/19   Karamalegos, Devonne Doughty, DO  ferrous sulfate 325 (65 FE) MG tablet Take 325 mg by mouth daily with breakfast.    [provider]  meloxicam (MOBIC) 15 MG tablet Take 1 tablet (15 mg total) by mouth daily. 10/14/19 04/11/20  Milinda Pointer, MD  Multiple Vitamin (MULTIVITAMIN WITH MINERALS) TABS tablet Take 1 tablet by mouth daily.    [provider]  Oxycodone HCl 10 MG TABS Take 1 tablet (10 mg total) by mouth 3 (three) times daily as needed. Must last 30 days. 10/24/19 11/23/19  Milinda Pointer, MD  Oxycodone HCl 10 MG TABS Take 1 tablet (10 mg total) by mouth 3 (three) times daily as needed. Must last 30 days. 11/23/19 12/23/19  Milinda Pointer, MD  Oxycodone HCl 10 MG TABS Take 1 tablet (10 mg total) by mouth 3 (three) times daily as needed. Must last 30 days. 12/23/19 01/22/20  Milinda Pointer, MD  pantoprazole (PROTONIX) 40 MG tablet Take 40 mg by mouth daily.     [provider]  prednisoLONE acetate (PRED FORTE) 1 % ophthalmic suspension Place 2 drops into the left eye daily. Patient not taking: Reported on 10/22/2019 02/18/19   [name], MD  pregabalin (LYRICA) 150 MG capsule Take 1 capsule (150 mg total) by mouth 3 (three) times daily. Must  last 30 days 10/14/19 04/11/20  Milinda Pointer, MD  risperiDONE (RISPERDAL) 0.5 MG tablet TAKE 1 TABLET BY MOUTH TWICE DAILY 11/20/19   Parks Ranger, Devonne Doughty, DO  timolol (TIMOPTIC) 0.5 % ophthalmic solution 1 drop 2 (two) times daily. Apply one drop to eye(s) 03/12/19   [provider]  traZODone (DESYREL) 50 MG tablet TAKE 1 TABLET BY MOUTH AT BEDTIME 08/28/19   Karamalegos, Alexander J, DO  umeclidinium-vilanterol (ANORO ELLIPTA) 62.5-25 MCG/INH AEPB Inhale 1 puff into the lungs daily.    [provider]    Allergies Azithromycin  Family History  Problem Relation Age of Onset  . Depression Mother   . Heart disease Father     Social History Social History   Tobacco Use  . Smoking status: Former Smoker    Years: 15.00  . Smokeless tobacco: Current User    Types: Chew  . Tobacco comment: stopped 15 years ago  Vaping Use  . Vaping Use: Never used  Substance Use Topics  . Alcohol use: Yes    Comment: past  . Drug use: Never     Review of Systems  Constitutional: No fever/chills.  Positive for "anxiousness." Eyes: No visual changes. No discharge  ENT: No upper respiratory complaints. Cardiovascular: Intermittent chest pain Respiratory: no cough.  Positive for shortness of breath. Gastrointestinal: No abdominal pain.  No nausea, no vomiting.  No diarrhea.  No constipation. Genitourinary: Negative for dysuria. No hematuria Musculoskeletal: Negative for musculoskeletal pain. Skin: Rash to bilateral legs Neurological: Negative for headaches, focal weakness or numbness. 10-point ROS otherwise negative.  ____________________________________________   PHYSICAL EXAM:  VITAL SIGNS: ED Triage Vitals  Enc Vitals Group     BP 12/06/19 2017 135/68     Pulse Rate 12/06/19 2017 75     Resp 12/06/19 2017 19     Temp 12/06/19 2017 98.1 F (36.7 C)     Temp Source 12/06/19 2017 Oral     SpO2 12/06/19 2017 94 %     Weight 12/06/19 2019 238 lb 1.6 oz (108 kg)      Height 12/06/19 2019 5\' 11"  (1.803 m)     Head Circumference --      Peak Flow --      Pain Score 12/06/19 2018 6     Pain Loc --      Pain Edu? --      Excl. in [place]? --      Constitutional: Alert and oriented. Well appearing and in no acute distress. Eyes: Conjunctivae are normal. PERRL. EOMI. Head: Atraumatic. ENT:      Ears:       Nose: No congestion/rhinnorhea.      Mouth/Throat: Mucous membranes are moist.  Neck: No stridor.  No cervical spine tenderness to palpation. Hematological/Lymphatic/Immunilogical: No cervical lymphadenopathy. Cardiovascular: Normal rate, regular rhythm. Normal S1 and S2.  Good peripheral circulation. Respiratory: Normal respiratory effort without tachypnea or retractions. Lungs CTAB. Good air entry to the bases with no decreased or absent breath sounds. Gastrointestinal: Bowel sounds 4 quadrants. Soft and nontender to palpation. No guarding or rigidity. No palpable masses. No distention. No CVA tenderness. Musculoskeletal: Full range of motion to all extremities. No gross deformities appreciated. Neurologic:  Normal speech and language. No gross focal neurologic deficits are appreciated.  Skin:  Skin is warm, dry and intact.  Visualization of the patient's legs, abdomen reveal petechial rash.  Patient does have a few patches of maculopapular rash intermixed, however majority has a petechial appearance. Psychiatric: Mood and affect are normal. Speech and behavior are normal. Patient exhibits appropriate insight and judgement.   ____________________________________________   LABS (all labs ordered are listed, but only abnormal results are displayed)  Labs Reviewed  COMPREHENSIVE METABOLIC PANEL - Abnormal; Notable for the following components:      Result Value   Glucose, Bld 107 (*)    All other components within normal limits  CBC WITH DIFFERENTIAL/PLATELET - Abnormal; Notable for the following components:   Hemoglobin 12.2 (*)    HCT 38.1  (*)    Eosinophils Absolute 0.6 (*)    All other components within normal limits  URINALYSIS, COMPLETE (UACMP) WITH MICROSCOPIC - Abnormal; Notable for the following components:   Color, Urine YELLOW (*)    APPearance CLOUDY (*)    Protein, ur 30 (*)    Leukocytes,Ua LARGE (*)    Bacteria, UA RARE (*)    All other components within normal limits  PROTIME-INR  TYPE AND SCREEN  TYPE AND SCREEN  TROPONIN I (HIGH SENSITIVITY)  TROPONIN I (HIGH SENSITIVITY)   ____________________________________________  EKG   ____________________________________________  RADIOLOGY   No results found.  ____________________________________________    PROCEDURES  Procedure(s) performed:    Procedures  Medications  LORazepam (ATIVAN) tablet 0.5 mg (0.5 mg Oral Given 12/06/19 2201)     ____________________________________________   INITIAL IMPRESSION / ASSESSMENT AND PLAN / ED COURSE  Pertinent labs & imaging results that were available during my care of the patient were reviewed by me and considered in my medical decision making (see chart for details).  Review of the [place] CSRS was performed in accordance of the [place] prior to dispensing any controlled drugs.  Clinical Course as of Dec 07 6  Sat Dec 06, 2019  2111 Patient presented to emergency department complaining of anxiety, shortness of breath, palpitations.  Patient states that he has felt exceptionally anxious with palpitations and shortness of breath.  Patient does also have a new rash but does not want to talk about it as "it is not part of the reason I am anxious."  Patient initially wants to leave [facility].  He would like a prescription for anxiety medications.  As there is findings on physical exam concerning for petechial rash as well as the patient's profound anxiety I informed the patient that unless he has a thorough work-up here in the emergency department I would not write any prescriptions.  Patient  feels as if I am "forcing" him to undergo an "unnecessary" work-up in order to receive his prescriptions.  I informed the patient that I highly recommended that he stay for labs, imaging and that if these were reassuring I would in fact prescribe him something for anxiety.  As I am concerned that patient may have thrombocytopenia due to increasing his medications without medical direction, I feel that writing a prescription for the patient and discharging him for anxiety would be significantly inappropriate.  Patient is agreeable at this time to work-up providing "as long as you know find anything you write me a prescription so I can go home."   [JC]  2114 Patient is on trazodone, Risperdal, Lyrica, meloxicam, aspirin, Wellbutrin, Cymbalta.  With these combinations of medication, I am you more concerned for thrombocytopenia.  Given this I will have the patient imaged with CT head, PE chest, labs ordered.   [JC]  Sun Dec 07, 2019  0004 Patient's daughter presented to the emergency department and I was able to get further information from her.  Patient has been talking recently about taking more of his medications.  The daughter was aware that he was talking about taking more of his oxycodone.  However the pills were counted and the patient is on track at this time.  Patient also has informed the daughter that he likely needs to see a new psychiatrist as he was discharged from his psychiatric practice after refusing to see one of the providers.  The daughter reports that the patient does have a history of alcohol and drug abuse in the past.  Patient's daughter remains with the patient at this time.   [JC]  0005 Patient care will be transferred to attending provider, Dr. Alfred Levins for final diagnosis and disposition.  Patient's work-up is only partially back.  Thankfully patient's CBC and CMP are reassuring at this time.  Urinalysis with leukocytes and some bacteria.  Initial troponin of 8.  Again patient is  awaiting imaging, final lab work.   [JC]    Clinical Course User Index [JC] Sharmarke Cicio, Charline Bills, PA-C          Patient presents emergency department complaining anxiety but also had complaints of chest pain, shortness of breath.  Patient was a relatively poor  historian.  Patient also had a new rash over the past 12 hours.  Visualization did reveal some maculopapular lesions, however patient also had a petechiae.  At this time given the fact the patient is on multiple antipsychotics, reports that he was increasing some of these dosing without medical approval, complaining of chest pain, shortness of breath and palpitations, I was concerned for multiple different differentials to include thrombocytopenia, CVA, cardiac ischemia, ACS/STEMI, PE, medication side effects, UTI.  Given these complaints patient will be evaluated with labs and imaging.  Halfway through patient's evaluation, patient care will be transferred to attending provider Dr. Alfred Levins.  At this time labs are relatively reassuring.  Imaging is still pending.  Patient's daughter arrived and was able to provide further information.  Final diagnosis and disposition will be provided by attending provider, Dr. Alfred Levins.  ____________________________________________  FINAL CLINICAL IMPRESSION(S) / ED DIAGNOSES  Final diagnoses:  None      NEW MEDICATIONS STARTED DURING THIS VISIT:  ED Discharge Orders    None          This chart was dictated using voice recognition software/Dragon. Despite best efforts to proofread, errors can occur which can change the meaning. Any change was purely unintentional.    Darletta Moll, PA-C 12/07/19 0008    Duffy Bruce, MD 12/08/19 [phone]

## 2019-12-06 NOTE — ED Notes (Signed)
Attempted to call pt's daughter per pt request at cellphone number that pt provided. Daughter does not answer. Pt updated. States he would like his daughter to come and sit with him.

## 2019-12-06 NOTE — ED Notes (Addendum)
Pt states he has not been able to sleep, feels anxious. Pt states he feels like at times he has shob and feels like his heart is racing. Pt states history of depression but does not feel depressed at this time. Pt states has been having symptoms for a week. Pt with rash as previously noted in assessment. resps unlabored. Pt denies cough, fever. Pt states he feels "jittery" inside.

## 2019-12-06 NOTE — ED Notes (Signed)
Spoke with pt's daughter for update. Pt continues to request daughter to sit with pt. Daughter informed that pt is requesting to sit with him. Pt's dughter states "that will stress me out, but tell him i'm coming" then hung up on RN while RN in mid sentence.

## 2019-12-07 ENCOUNTER — Emergency Department: Payer: Medicare Other

## 2019-12-07 ENCOUNTER — Encounter: Payer: Self-pay | Admitting: Radiology

## 2019-12-07 LAB — TROPONIN I (HIGH SENSITIVITY): Troponin I (High Sensitivity): 8 ng/L (ref <18)

## 2019-12-07 LAB — TYPE AND SCREEN
ABO/RH(D): O POS
Antibody Screen: NEGATIVE

## 2019-12-07 MED ORDER — TRAZODONE HCL 50 MG PO TABS: 50.0000 mg | ORAL_TABLET | Freq: Every day | ORAL | Status: DC

## 2019-12-07 MED ORDER — RISPERIDONE 1 MG PO TABS: 2.0000 mg | ORAL_TABLET | Freq: Two times a day (BID) | ORAL | Status: DC

## 2019-12-07 MED ORDER — RISPERIDONE 1 MG PO TABS: 0.5000 mg | ORAL_TABLET | Freq: Two times a day (BID) | ORAL | Status: DC

## 2019-12-07 MED ORDER — IOHEXOL 350 MG/ML SOLN
100.0000 mL | Freq: Once | INTRAVENOUS | Status: AC | PRN
  Administered 2019-12-07: 100 mL via INTRAVENOUS

## 2019-12-07 MED ORDER — FENOFIBRATE 160 MG PO TABS
160.0000 mg | ORAL_TABLET | Freq: Every day | ORAL | Status: DC
  Filled 2019-12-07: qty 1

## 2019-12-07 MED ORDER — BUPROPION HCL ER (SR) 150 MG PO TB12
150.0000 mg | ORAL_TABLET | Freq: Two times a day (BID) | ORAL | 1 refills | Status: DC
Start: 2019-12-07 — End: 2019-12-13

## 2019-12-07 MED ORDER — CLONAZEPAM 0.5 MG PO TABS
0.5000 mg | ORAL_TABLET | Freq: Two times a day (BID) | ORAL | 0 refills | Status: DC | PRN
Start: 2019-12-07 — End: 2019-12-16

## 2019-12-07 MED ORDER — MELOXICAM 7.5 MG PO TABS
15.0000 mg | ORAL_TABLET | Freq: Every day | ORAL | Status: DC
  Filled 2019-12-07: qty 2

## 2019-12-07 MED ORDER — RISPERIDONE 2 MG PO TABS
2.0000 mg | ORAL_TABLET | Freq: Every day | ORAL | 1 refills | Status: DC
Start: 2019-12-07 — End: 2020-02-04

## 2019-12-07 MED ORDER — ALBUTEROL SULFATE HFA 108 (90 BASE) MCG/ACT IN AERS: 2.0000 | INHALATION_SPRAY | Freq: Four times a day (QID) | RESPIRATORY_TRACT | Status: DC

## 2019-12-07 MED ORDER — BUPROPION HCL ER (SR) 150 MG PO TB12
150.0000 mg | ORAL_TABLET | Freq: Two times a day (BID) | ORAL | Status: DC
  Filled 2019-12-07 (×2): qty 1

## 2019-12-07 MED ORDER — DULOXETINE HCL 60 MG PO CPEP: 60.0000 mg | ORAL_CAPSULE | Freq: Every day | ORAL | Status: DC

## 2019-12-07 MED ORDER — OXYCODONE HCL 5 MG PO TABS: 10.0000 mg | ORAL_TABLET | Freq: Three times a day (TID) | ORAL | Status: DC | PRN

## 2019-12-07 MED ORDER — OXYCODONE HCL 10 MG PO TABS: 10.0000 mg | ORAL_TABLET | Freq: Three times a day (TID) | ORAL | Status: DC | PRN

## 2019-12-07 MED ORDER — OXYCODONE HCL 5 MG PO TABS: 10.0000 mg | ORAL_TABLET | Freq: Once | ORAL | Status: DC

## 2019-12-07 MED ORDER — RISPERIDONE 1 MG PO TABS: 2.0000 mg | ORAL_TABLET | Freq: Every day | ORAL | Status: DC

## 2019-12-07 MED ORDER — DULOXETINE HCL 20 MG PO CPEP
80.0000 mg | ORAL_CAPSULE | Freq: Every day | ORAL | 1 refills | Status: DC
Start: 2019-12-07 — End: 2020-07-09

## 2019-12-07 MED ORDER — CLONAZEPAM 0.5 MG PO TABS: 1.0000 mg | ORAL_TABLET | Freq: Two times a day (BID) | ORAL | Status: DC

## 2019-12-07 MED ORDER — TRAZODONE HCL 100 MG PO TABS: 100.0000 mg | ORAL_TABLET | Freq: Every day | ORAL | Status: DC

## 2019-12-07 MED ORDER — DULOXETINE HCL 60 MG PO CPEP: 80.0000 mg | ORAL_CAPSULE | Freq: Every day | ORAL | Status: DC

## 2019-12-07 NOTE — BH Assessment (Signed)
Assessment Note  Aaron Gibbs is an 85 y.o. male who presents to [facility] ED voluntarily for treatment. Per triage note, Pt arrives POV with daughter with c/o anxiety. Pt reports that he believes that he is having an anxiety attack due to rapid heart rate and increased RR per report from pt. Pt is in NAD.  During TTS assessment pt presents alert and oriented x 4, anxious but cooperative, and mood-congruent with affect. Pt does not appear to be responding to internal or external stimuli. Neither is the pt presenting with any delusional thinking. Pt confirmed the information provided to triage RN. Pt identified his main compliant to be anxiety. Pt identified a hx of anxiety but expressed an increase in the last 4 days. Pt was unable to identify any triggers and denied any recent changes to his lifestyle or current medications. Pt identified an INPT Hx in [place] 2  years ago for depression/SI but was unable to recall the name of the hospital. Pt reports a suicide attempt using a knife 2  years ago and stated "I haven't felt that low since". Pt denies a previous or current OPT hx. Pt denies any current SA and stated "I may have a beer every once in while". Pt denies any current struggles with depression or eating but reports struggles sleeping. Pt reports to be getting 2-3 hours a sleep a night due to his increased anxiety. Pt reports to have active support from his daughter who lives a few blocks from his home. Pt denies any current SI/HI/AH/VH and contracted for safety stating "I will be ok if I can get a prescription for anxiety".   Per Dr. Janese Banks pt does not meet criteria for inpatient treatment.   Diagnosis: Generalized Anxiety   Past Medical History:  Past Medical History:  Diagnosis Date  . Anxiety   . Cancer ([place])    skin  . COPD (chronic obstructive pulmonary disease) ([place])   . Glaucoma   . Osteoporosis    osteopenia    Past Surgical History:  Procedure Laterality Date  .  CHOLECYSTECTOMY    . TRANSURETHRAL RESECTION OF PROSTATE N/A 2008    Family History:  Family History  Problem Relation Age of Onset  . Depression Mother   . Heart disease Father     Social History:  reports that he has quit smoking. He quit after 15.00 years of use. His smokeless tobacco use includes chew. He reports current alcohol use. He reports that he does not use drugs.  Additional Social History:  Alcohol / Drug Use Pain Medications: see mar Prescriptions: see mar Over the Counter: see mar History of alcohol / drug use?: No history of alcohol / drug abuse  CIWA: CIWA-Ar BP: 132/78 Pulse Rate: 81 COWS:    Allergies:  Allergies  Allergen Reactions  . Azithromycin Shortness Of Breath    Home Medications: (Not in a hospital admission)   OB/GYN Status:  No LMP for male patient.  General Assessment Data Location of Assessment: [facility] ED TTS Assessment: In system Is this a Tele or Face-to-Face Assessment?: Face-to-Face Is this an Initial Assessment or a Re-assessment for this encounter?: Initial Assessment Patient Accompanied by:: N/A Language Other than English: No Living Arrangements: Other (Comment) (Private home) What gender do you identify as?: Male Marital status: Single Maiden name: n/a Pregnancy Status: No Living Arrangements: Alone Can pt return to current living arrangement?: No Admission Status: Voluntary Is patient capable of signing voluntary admission?: Yes Referral Source: Self/Family/Friend Insurance type: [place]  Healthcare   Medical Screening Exam ([place]) Medical Exam completed: Yes  Crisis Care Plan Living Arrangements: Alone Legal Guardian:  (self) Name of Psychiatrist: None reported  Name of Therapist: None reported   Education Status Is patient currently in school?: No Is the patient employed, unemployed or receiving disability?: Unemployed (retired )  Risk to self with the past 6 months Suicidal Ideation: No Has  patient been a risk to self within the past 6 months prior to admission? : No Suicidal Intent: No Has patient had any suicidal intent within the past 6 months prior to admission? : No Is patient at risk for suicide?: No Suicidal Plan?: No Has patient had any suicidal plan within the past 6 months prior to admission? : No Access to Means: No What has been your use of drugs/alcohol within the last 12 months?: Pt reports drinking a beer occassionally Previous Attempts/Gestures: Yes (Attempted SI using knife 2 1/2 years ago ) How many times?: 1 Other Self Harm Risks:  (None reported ) Triggers for Past Attempts: None known Intentional Self Injurious Behavior: None Family Suicide History: Unknown Recent stressful life event(s):  (None reported ) Persecutory voices/beliefs?: No Depression: Yes Depression Symptoms: Insomnia (None reported ) Substance abuse history and/or treatment for substance abuse?: No Suicide prevention information given to non-admitted patients: Not applicable  Risk to Others within the past 6 months Homicidal Ideation: No Does patient have any lifetime risk of violence toward others beyond the six months prior to admission? : No Thoughts of Harm to Others: No Current Homicidal Intent: No Current Homicidal Plan: No Access to Homicidal Means: No Identified Victim: n/a History of harm to others?: No Assessment of Violence: None Noted Violent Behavior Description: n/a Does patient have access to weapons?: No Criminal Charges Pending?: No Does patient have a court date: No Is patient on probation?: No  Psychosis Hallucinations: None noted Delusions: None noted  Mental Status Report Appearance/Hygiene: In scrubs Eye Contact: Poor Motor Activity: Freedom of movement Speech: Logical/coherent Level of Consciousness: Alert Mood: Depressed, Anxious, Pleasant Affect: Anxious, Depressed Anxiety Level: Moderate Thought Processes: Relevant, Coherent Judgement:  Partial Orientation: Appropriate for developmental age Obsessive Compulsive Thoughts/Behaviors: None  Cognitive Functioning Concentration: Good Memory: Recent Intact, Remote Intact Is patient IDD: No Insight: Poor Impulse Control: Good Appetite: Good Have you had any weight changes? : No Change Sleep: Decreased Total Hours of Sleep:  (pt reports 2-3hrs) Vegetative Symptoms: None     Prior Inpatient Therapy Prior Inpatient Therapy: Yes Prior Therapy Dates: 2 years ago Prior Therapy Facilty/Provider(s): [facility] (Pt unsure of facility name ) Reason for Treatment: depression/SI  Prior Outpatient Therapy Prior Outpatient Therapy: No Does patient have an ACCT team?: No Does patient have Intensive In-House Services?  : No Does patient have [facility] services? : No Does patient have P4CC services?: Unknown  ADL Screening (condition at time of admission) Is the patient deaf or have difficulty hearing?: No Does the patient have difficulty seeing, even when wearing glasses/contacts?: No Does the patient have difficulty concentrating, remembering, or making decisions?: No Does the patient have difficulty dressing or bathing?: No Does the patient have difficulty walking or climbing stairs?: No Weakness of Legs: None Weakness of Arms/Hands: None  Home Assistive Devices/Equipment Home Assistive Devices/Equipment: None  Therapy Consults (therapy consults require a physician order) PT Evaluation Needed: No OT Evalulation Needed: No SLP Evaluation Needed: No Abuse/Neglect Assessment (Assessment to be complete while patient is alone) Abuse/Neglect Assessment Can Be Completed: Yes Physical Abuse: Denies  Verbal Abuse: Denies Sexual Abuse: Denies Exploitation of patient/patient's resources: Denies Self-Neglect: Denies Values / Beliefs Cultural Requests During Hospitalization: None Spiritual Requests During Hospitalization: None Consults Spiritual Care Consult Needed:  No Transition of Care Team Consult Needed: No Advance Directives (For Healthcare) Does Patient Have a Medical Advance Directive?: Yes Does patient want to make changes to medical advance directive?: No - Patient declined Type of Advance Directive: [place] Would patient like information on creating a medical advance directive?: No - Patient declined          Disposition:  Disposition Initial Assessment Completed for this Encounter: Yes Patient referred to: Other (Comment)  On Site Evaluation by:   Reviewed with Physician:    Shanon Ace 12/07/2019 12:44 PM

## 2019-12-07 NOTE — ED Notes (Signed)
Report to tom, rn.

## 2019-12-07 NOTE — Consult Note (Signed)
[facility] Psychiatry Consult   Reason for Consult:  Depression anxiety   Referring Physician:   ED MD  Patient Identification: Aaron Gibbs MRN:  [id number] Principal Diagnosis: <principal problem not specified>   Major depression severe recurrent Generalized anxiety  ETOH dependence  Adjustment disorder  Mixed anxiety emotions conduct   Diagnosis:  Active Problems:   * No active hospital problems. *  Breakthrough anxiety and panic with medications wearing off and need for day treatment referral   Total Time spent with patient:  45-min to one hour   Subjective:   Aaron Gibbs is a 85 y.o. male patient in ER  with issues of breakthrough panic and anxiety      HPI:  History of above diagnoses where meds are wearing off and living alone is taking its toll  He has panic symptoms fear dread doom and gloom, anxiety tension frustration nervousness somatic and anxious symptoms came to ER due to mainly anxiety and panic  Was treated and hospitalized for ETOH dependence six months ago and also was admitted two years ago for dual diagnosis issues  Not drinking now---but feels anxiety and mood are going down hill  He also has some elements of depressed mood, low energy, motivation enthusiasm,, lack of sleep --crying spells, anhedonia at times  But no active SI HI or plans and no related mania or psychosis   Currently contracts for safety   Willing to go home with med adjustments  And be referred to day treatment via TTS     Past Psychiatric History:   See above ---for admits   No recent AA NA or related programming  Has in home care am to 2:30 but needs more structured activities   Risk to Self: Suicidal Ideation: No Suicidal Intent: No Is patient at risk for suicide?: No Suicidal Plan?: No Access to Means: No What has been your use of drugs/alcohol within the last 12 months?: Pt reports drinking a beer occassionally How many times?: 1 Other Self Harm Risks:   (None reported ) Triggers for Past Attempts: None known Intentional Self Injurious Behavior: None Risk to Others: Homicidal Ideation: No Thoughts of Harm to Others: No Current Homicidal Intent: No Current Homicidal Plan: No Access to Homicidal Means: No Identified Victim: n/a History of harm to others?: No Assessment of Violence: None Noted Violent Behavior Description: n/a Does patient have access to weapons?: No Criminal Charges Pending?: No Does patient have a court date: No Prior Inpatient Therapy: Prior Inpatient Therapy: Yes Prior Therapy Dates: 2 years ago Prior Therapy Facilty/Provider(s): [facility] (Pt unsure of facility name ) Reason for Treatment: depression/SI Prior Outpatient Therapy: Prior Outpatient Therapy: No Does patient have an ACCT team?: No Does patient have Intensive In-House Services?  : No Does patient have [facility] services? : No Does patient have P4CC services?: Unknown  Past Medical History:  Past Medical History:  Diagnosis Date  . Anxiety   . Cancer ([place])    skin  . COPD (chronic obstructive pulmonary disease) ([place])   . Glaucoma   . Osteoporosis    osteopenia    Past Surgical History:  Procedure Laterality Date  . CHOLECYSTECTOMY    . TRANSURETHRAL RESECTION OF PROSTATE N/A 2008   Family History:  Family History  Problem Relation Age of Onset  . Depression Mother   . Heart disease Father    Family Psychiatric  History:  Currently not significant  Social History:  Lives alone with in home services that come in  Social History   Substance and Sexual Activity  Alcohol Use Yes   Comment: past     Social History   Substance and Sexual Activity  Drug Use Never    Social History   Socioeconomic History  . Marital status: Divorced    Spouse name: Not on file  . Number of children: Not on file  . Years of education: Not on file  . Highest education level: Not on file  Occupational History  . Occupation: retired  Tobacco Use   . Smoking status: Former Smoker    Years: 15.00  . Smokeless tobacco: Current User    Types: Chew  . Tobacco comment: stopped 15 years ago  Vaping Use  . Vaping Use: Never used  Substance and Sexual Activity  . Alcohol use: Yes    Comment: past  . Drug use: Never  . Sexual activity: Not Currently  Other Topics Concern  . Not on file  Social History Narrative  . Not on file   Social Determinants of Health   Financial Resource Strain: Low Risk   . Difficulty of Paying Living Expenses: Not very hard  Food Insecurity: No Food Insecurity  . Worried About Charity fundraiser in the Last Year: Never true  . Ran Out of Food in the Last Year: Never true  Transportation Needs: No Transportation Needs  . Lack of Transportation (Medical): No  . Lack of Transportation (Non-Medical): No  Physical Activity: Inactive  . Days of Exercise per Week: 0 days  . Minutes of Exercise per Session: 0 min  Stress: Stress Concern Present  . Feeling of Stress : Rather much  Social Connections: Unknown  . Frequency of Communication with Friends and Family: Not on file  . Frequency of Social Gatherings with Friends and Family: Not on file  . Attends Religious Services: Not on file  . Active Member of Clubs or Organizations: Not on file  . Attends Archivist Meetings: Not on file  . Marital Status: Divorced   Additional Social History:    Allergies:   Allergies  Allergen Reactions  . Azithromycin Shortness Of Breath    Labs:  Results for orders placed or performed during the hospital encounter of 12/06/19 (from the past 48 hour(s))  Comprehensive metabolic panel     Status: Abnormal   Collection Time: 12/06/19 10:55 PM  Result Value Ref Range   Sodium 137 135 - 145 mmol/L   Potassium 4.3 3.5 - 5.1 mmol/L   Chloride 103 98 - 111 mmol/L   CO2 28 22 - 32 mmol/L   Glucose, Bld 107 (H) 70 - 99 mg/dL    Comment: Glucose reference range applies only to samples taken after fasting for  at least 8 hours.   BUN 16 8 - 23 mg/dL   Creatinine, Ser 1.13 0.61 - 1.24 mg/dL   Calcium 9.1 8.9 - 10.3 mg/dL   Total Protein 6.8 6.5 - 8.1 g/dL   Albumin 3.8 3.5 - 5.0 g/dL   AST 26 15 - 41 U/L   ALT 22 0 - 44 U/L   Alkaline Phosphatase 46 38 - 126 U/L   Total Bilirubin 0.7 0.3 - 1.2 mg/dL   GFR calc non Af Amer >60 >60 mL/min   GFR calc Af Amer >60 >60 mL/min   Anion gap 6 5 - 15    Comment: Performed at [facility], [address] [place], [place] [address]  Troponin I (High Sensitivity)  Status: None   Collection Time: 12/06/19 10:55 PM  Result Value Ref Range   Troponin I (High Sensitivity) 8 <18 ng/L    Comment: (NOTE) Elevated high sensitivity troponin I (hsTnI) values and significant  changes across serial measurements may suggest ACS but many other  chronic and acute conditions are known to elevate hsTnI results.  Refer to the "Links" section for chest pain algorithms and additional  guidance. Performed at [facility], [address] [place], [place] [address]   CBC with Differential     Status: Abnormal   Collection Time: 12/06/19 10:55 PM  Result Value Ref Range   WBC 4.4 4.0 - 10.5 K/uL   RBC 4.23 4.22 - 5.81 MIL/uL   Hemoglobin 12.2 (L) 13.0 - 17.0 g/dL   HCT 38.1 (L) 39 - 52 %   MCV 90.1 80.0 - 100.0 fL   MCH 28.8 26.0 - 34.0 pg   MCHC 32.0 30.0 - 36.0 g/dL   RDW 14.6 11.5 - 15.5 %   Platelets 168 150 - 400 K/uL   nRBC 0.0 0.0 - 0.2 %   Neutrophils Relative % 40 %   Neutro Abs 1.8 1.7 - 7.7 K/uL   Lymphocytes Relative 25 %   Lymphs Abs 1.1 0.7 - 4.0 K/uL   Monocytes Relative 19 %   Monocytes Absolute 0.8 0 - 1 K/uL   Eosinophils Relative 14 %   Eosinophils Absolute 0.6 (H) 0 - 0 K/uL   Basophils Relative 1 %   Basophils Absolute 0.0 0 - 0 K/uL   Immature Granulocytes 1 %   Abs Immature Granulocytes 0.02 0.00 - 0.07 K/uL    Comment: Performed at [facility], [address] [place], [place] [address]   Protime-INR     Status: None   Collection Time: 12/06/19 10:55 PM  Result Value Ref Range   Prothrombin Time 12.3 11.4 - 15.2 seconds   INR 1.0 0.8 - 1.2    Comment: (NOTE) INR goal varies based on device and disease states. Performed at [facility], [address] [place], [place] [address]   Type and screen [place]     Status: None (Preliminary result)   Collection Time: 12/06/19 10:55 PM  Result Value Ref Range   ABO/RH(D) PENDING    Antibody Screen PENDING    Sample Expiration      12/09/2019,2359 Performed at [facility], [address] [place], [place] [address]   Urinalysis, Complete w Microscopic     Status: Abnormal   Collection Time: 12/06/19 10:55 PM  Result Value Ref Range   Color, Urine YELLOW (A) YELLOW   APPearance CLOUDY (A) CLEAR   Specific Gravity, Urine 1.013 1.005 - 1.030   pH 8.0 5.0 - 8.0   Glucose, UA NEGATIVE NEGATIVE mg/dL   Hgb urine dipstick NEGATIVE NEGATIVE   Bilirubin Urine NEGATIVE NEGATIVE   Ketones, ur NEGATIVE NEGATIVE mg/dL   Protein, ur 30 (A) NEGATIVE mg/dL   Nitrite NEGATIVE NEGATIVE   Leukocytes,Ua LARGE (A) NEGATIVE   RBC / HPF 0-5 0 - 5 RBC/hpf   WBC, UA 21-50 0 - 5 WBC/hpf   Bacteria, UA RARE (A) NONE SEEN   Squamous Epithelial / LPF 0-5 0 - 5   WBC Clumps PRESENT     Comment: Performed at [facility], [place] [address], [place] [address]  Troponin I (High Sensitivity)     Status: None   Collection Time: 12/07/19  2:04  AM  Result Value Ref Range   Troponin I (High Sensitivity) 8 <18 ng/L    Comment: (NOTE) Elevated high sensitivity troponin I (hsTnI) values and significant  changes across serial measurements may suggest ACS but many other  chronic and acute conditions are known to elevate hsTnI results.  Refer to the "Links" section for chest pain algorithms and additional  guidance. Performed at [facility], [address] [place], [place]  [address]   Type and screen Ordered by PROVIDER DEFAULT     Status: None   Collection Time: 12/07/19  2:22 AM  Result Value Ref Range   ABO/RH(D) O POS    Antibody Screen NEG    Sample Expiration      12/10/2019,2359 Performed at [facility], [address], [place], [place] [address]     Current Facility-Administered Medications  Medication Dose Route Frequency Provider Last Rate Last Admin  . albuterol (VENTOLIN HFA) 108 (90 Base) MCG/ACT inhaler 2 puff  2 puff Inhalation Q6H Eulas Post, MD      . buPROPion [facility] SR) 12 hr tablet 150 mg  150 mg Oral BID Eulas Post, MD      . clonazePAM [facility] [place]) tablet 1 mg  1 mg Oral BID Eulas Post, MD      . Derrill Memo ON 12/08/2019] DULoxetine (CYMBALTA) DR capsule 80 mg  80 mg Oral Daily Eulas Post, MD      . fenofibrate tablet 160 mg  160 mg Oral Daily Eulas Post, MD      . meloxicam [facility]) tablet 15 mg  15 mg Oral Daily Eulas Post, MD      . Oxycodone HCl TABS 10 mg  10 mg Oral TID PRN Eulas Post, MD      . Oxycodone HCl TABS 10 mg  10 mg Oral TID PRN Eulas Post, MD      . risperiDONE (RISPERDAL) tablet 2 mg  2 mg Oral QHS Eulas Post, MD      . traZODone (DESYREL) tablet 100 mg  100 mg Oral QHS Eulas Post, MD       Current Outpatient Medications  Medication Sig Dispense Refill  . albuterol (VENTOLIN HFA) 108 (90 Base) MCG/ACT inhaler INHALE 1-2 PUFFS INTO THE LUNGS EVERY 6 HOURS AS NEEDED 8.5 g 3  . amLODipine (NORVASC) 10 MG tablet Take 1 tablet (10 mg total) by mouth daily. 90 tablet 1  . aspirin EC 81 MG tablet Take 81 mg by mouth daily.    . bacitracin ophthalmic ointment 1 application as needed. Apply to affected eye    . buPROPion (WELLBUTRIN SR) 150 MG 12 hr tablet Take 1 tablet (150 mg total) by mouth 2 (two) times daily. 60 tablet 1  . clonazePAM (KLONOPIN) 0.5 MG tablet Take 1 tablet (0.5 mg total) by mouth 2 (two) times daily as needed for anxiety. 60 tablet 0   . diclofenac sodium (VOLTAREN) 1 % GEL Apply 1 application topically as needed.    . DULoxetine (CYMBALTA) 20 MG capsule Take 4 capsules (80 mg total) by mouth daily. 120 capsule 1  . fenofibrate (TRICOR) 145 MG tablet Take 1 tablet (145 mg total) by mouth daily. 90 tablet 1  . ferrous sulfate 325 (65 FE) MG tablet Take 325 mg by mouth daily with breakfast.    . meloxicam (MOBIC) 15 MG tablet Take 1 tablet (15 mg total) by mouth daily. 30 tablet 5  . Multiple Vitamin (MULTIVITAMIN WITH MINERALS) TABS tablet Take 1 tablet by  mouth daily.    . Oxycodone HCl 10 MG TABS Take 1 tablet (10 mg total) by mouth 3 (three) times daily as needed. Must last 30 days. 90 tablet 0  . Oxycodone HCl 10 MG TABS Take 1 tablet (10 mg total) by mouth 3 (three) times daily as needed. Must last 30 days. 90 tablet 0  . [START ON 12/23/2019] Oxycodone HCl 10 MG TABS Take 1 tablet (10 mg total) by mouth 3 (three) times daily as needed. Must last 30 days. 90 tablet 0  . pantoprazole (PROTONIX) 40 MG tablet Take 40 mg by mouth daily.     . prednisoLONE acetate (PRED FORTE) 1 % ophthalmic suspension Place 2 drops into the left eye daily. (Patient not taking: Reported on 10/22/2019) 5 mL 0  . pregabalin (LYRICA) 150 MG capsule Take 1 capsule (150 mg total) by mouth 3 (three) times daily. Must last 30 days 90 capsule 5  . risperiDONE (RISPERDAL) 2 MG tablet Take 1 tablet (2 mg total) by mouth at bedtime. 30 tablet 1  . timolol (TIMOPTIC) 0.5 % ophthalmic solution 1 drop 2 (two) times daily. Apply one drop to eye(s)    . traZODone (DESYREL) 50 MG tablet TAKE 1 TABLET BY MOUTH AT BEDTIME 90 tablet 1  . umeclidinium-vilanterol (ANORO ELLIPTA) 62.5-25 MCG/INH AEPB Inhale 1 puff into the lungs daily.      Musculoskeletal: Strength & Muscle Tone: no new change  Gait & Station: slow with assistance  Patient leans: n/a   Psychiatric Specialty Exam: Physical Exam  Review of Systems  Blood pressure 132/78, pulse 81, temperature 98.4  F (36.9 C), temperature source Oral, resp. rate 16, height 5\' 11"  (1.803 m), weight 108 kg, SpO2 93 %.Body mass index is 33.21 kg/m.   Mental Status  Alert cooperative, rapport fair  Eye contact fair --somewhat psycho motor slowed  Oriented to person place date and time  Appearance --somewhat haggard, forlorn unkept Consciousness not clouded or fluctuant Concentration and attention normal  Movements --no shakes and tremors  Or other movements Mood somewhat depressed and anxious Thought process ---normal  Thought content --depressive and anxious themes dread and doom themes no frank psychosis or mania Judgement insight reliability fair  Speech --somewhat low tone volume  Fluency okay  Memory --remote recent immediate generally intact through general questions Fund of knowledge intelligence normal  Cognition normal  Sleep --less sleep for all three cycles  Aims negative Abstraction normal                                                                Treatment Plan Summary:  Caucasian male with breakthrough panic and anxiety willing to contract for safety and go to day treatment via Medicare   Cymbalta increased to 80 Klonopin added 1 mg po bid Trazodone increased to 100 qhs  Risperdal increased to 2 mg po qhs   21 days given   Spoke to daughter to explain the issues in general and she is agreeable     Disposition:  Home at this time    Eulas Post, MD 12/07/2019 12:44 PM

## 2019-12-07 NOTE — ED Provider Notes (Addendum)
I was asked to follow up results of CT head and chest.  CT of the head was unremarkable.  CT of the chest concerning for a mass in the lingula concerning for possible malignancy.  The repeat troponin was negative.  Patient was given Ativan.  When I went in the room after the CT to update the patient he was fast asleep and really hard to arouse.  According to the daughter he had been unable to sleep for the last 2 to 3 days due to severe anxiety.  I told her the results of the CT scan and the need to follow-up with pulmonology for evaluation of possible malignancy.  Patient remained stable, sleeping, maintaining his airway, with normal sats, but difficult to wake up until this morning.  Patient was ambulatory at this morning.  Explained to him the findings of the CT.  According to him he was told that this was there long time ago.  I explained to him that this finding is new when compared to CT from January 2020 and discussed the importance of follow-up to make sure this is not malignancy.  UA inconclusive, patient with no symptoms of UTI, culture sent, will hold off abx. Patient is requesting to speak with psychiatrist.  He would like to voluntarily go to an inpatient psychiatric unit to have his psych meds adjusted and his anxiety better controlled.  He reports that he has been unable to function properly due to severe anxiety.  Has been unable to sleep.  Radiology also raised the possibility of pneumonia.  Patient denies cough, fever, he has no white count.  Therefore clinically there are no signs of pneumonia.  We will keep patient voluntary and place a psychiatry consult.    I have personally reviewed the images performed during this visit and I agree with the Radiologist's read.   Interpretation by Radiologist:  CT Head Wo Contrast  Result Date: 12/07/2019 CLINICAL DATA:  Altered mental status EXAM: CT HEAD WITHOUT CONTRAST TECHNIQUE: Contiguous axial images were obtained from the base of the skull  through the vertex without intravenous contrast. COMPARISON:  September 13, 2019 FINDINGS: Brain: No evidence of acute territorial infarction, hemorrhage, hydrocephalus,extra-axial collection or mass lesion/mass effect. There is dilatation the ventricles and sulci consistent with age-related atrophy. Low-attenuation changes in the deep white matter consistent with small vessel ischemia. Vascular: No hyperdense vessel or unexpected calcification. Skull: The skull is intact. No fracture or focal lesion identified. Sinuses/Orbits: The visualized paranasal sinuses and mastoid air cells are clear. The orbits and globes intact. Other: None IMPRESSION: No acute intracranial abnormality. Findings consistent with age related atrophy and chronic small vessel ischemia Electronically Signed   By: Prudencio Pair M.D.   On: 12/07/2019 01:31   CT Angio Chest PE W and/or Wo Contrast  Result Date: 12/07/2019 CLINICAL DATA:  Shortness of breath, anxiety, palpitations. Unable to sleep. EXAM: CT ANGIOGRAPHY CHEST WITH CONTRAST TECHNIQUE: Multidetector CT imaging of the chest was performed using the standard protocol during bolus administration of intravenous contrast. Multiplanar CT image reconstructions and MIPs were obtained to evaluate the vascular anatomy. CONTRAST:  146mL OMNIPAQUE IOHEXOL 350 MG/ML SOLN COMPARISON:  Most recent radiograph 09/25/2019. Most recent chest CTA 07/02/2018 FINDINGS: Cardiovascular: There are no filling defects within the pulmonary arteries to suggest pulmonary embolus. Tortuous thoracic aorta without dissection or acute aortic syndrome. Mild cardiomegaly. No pericardial effusion. Mediastinum/Nodes: Few calcified subcarinal lymph nodes consistent with prior granulomatous disease. There is a prominent right lower paratracheal node measuring  12 mm, previously 10 mm. Borderline left hilar node measuring 10 mm. Small right hilar nodes are subcentimeter. Previous right thyroid nodule is obscured by current  positioning and streak artifact from dense IV contrast in the subclavian vessels. No esophageal wall thickening. Lungs/Pleura: Exaggerated thoracic kyphosis distorts normal anatomy. There is a 17 x 16 mm nodule in the lingula with slightly irregular margins, not present on prior exam. Adjacent ill-defined opacities in the lingula extend to the subpleural region. Small to moderate right pleural effusion with adjacent atelectasis. Trace left pleural effusion. Scattered ill-defined ground-glass opacities within both lungs which are nonspecific. No definite septal thickening. Elongated filling defect in the upper trachea is likely retained mucus, but nonspecific. Upper Abdomen: No acute finding, mild motion artifact limitations. Musculoskeletal: Significantly exaggerated thoracic kyphosis. Bony under mineralization with flowing anterior osteophytes/syndesmophytes throughout the thoracic spine. Review of the MIP images confirms the above findings. IMPRESSION: 1. No pulmonary embolus. 2. A 17 x 16 mm nodule in the lingula with slightly irregular margins, not present on prior exam. This is suspicious for neoplasm. Consider pulmonary referral. Further evaluation with PET-CT could be considered versus short interval follow-up CT. 3. Small to moderate right and trace left pleural effusions with adjacent atelectasis. 4. Scattered ill-defined ground-glass opacities within both lungs are nonspecific, may be infectious or inflammatory. A component of atelectasis is also considered. 5. Prominent right lower paratracheal and left hilar nodes are likely reactive. 6. Probable retained mucus in the trachea. Aortic Atherosclerosis (ICD10-I70.0). Electronically Signed   By: Keith Rake M.D.   On: 12/07/2019 01:36      Rudene Re, MD 12/07/19 [place], [place], MD 12/07/19 [phone]

## 2019-12-07 NOTE — ED Notes (Signed)
Pt no longer anxious, resting, daughter at bedside and daughter updated by j. Curthriell, pa

## 2019-12-07 NOTE — ED Notes (Signed)
Pt requested pain medication for knee pain - discussed with Dr Charna Archer and VO given for oxycodine 10mg  x1 now

## 2019-12-07 NOTE — ED Notes (Signed)
Pt daughter [name] [phone]

## 2019-12-07 NOTE — Discharge Instructions (Signed)
Follow up with your doctor for further evaluation of the mass is seen in your lung.  As we discussed, this could be consistent with cancer and therefore it is very important that you see your doctor soon as possible.

## 2019-12-07 NOTE — Progress Notes (Deleted)
Patient not feeling well and rescheduled the appointment.

## 2019-12-07 NOTE — ED Notes (Signed)
Per report pt is awaiting psych consult

## 2019-12-07 NOTE — ED Notes (Signed)
Pharmacy called to send medications that have not arrived yet - they state they will send shortly

## 2019-12-07 NOTE — ED Provider Notes (Signed)
-----------------------------------------   12:05 PM on 12/07/2019 -----------------------------------------  Blood pressure 132/78, pulse 81, temperature 98.4 F (36.9 C), temperature source Oral, resp. rate 16, height 5\' 11"  (1.803 m), weight 108 kg, SpO2 93 %.  Assuming care from Dr. Alfred Levins.  In short, Aaron Gibbs is a 85 y.o. male with a chief complaint of Anxiety .  Refer to the original H&P for additional details.  The current plan of care is to follow-up psychiatry recommendations.  ----------------------------------------- 12:05 PM on 12/07/2019 -----------------------------------------  Patient has been evaluated by psychiatry, who recommends increasing his doses of risperidone, Cymbalta, and Wellbutrin to assist with control of his anxiety.  They also recommend starting patient on Klonopin as needed for additional control, patient to follow-up with his PCP and was counseled to return to the ED for new or worsening symptoms.    Blake Divine, MD 12/07/19 [phone]

## 2019-12-07 NOTE — BH Assessment (Signed)
TTS checked in with pt's current RN. RN reports pt to be sleeping and agreed to contact TTS when pt awakes and is able to be assessed.

## 2019-12-07 NOTE — ED Notes (Signed)
Pt given breakfast tray with coffee TTS notified that pt is awake and able to be interviewed Pt daughter [name] was updated via phone concerning POC

## 2019-12-08 ENCOUNTER — Ambulatory Visit: Payer: Medicare Other | Admitting: Family Medicine

## 2019-12-08 ENCOUNTER — Ambulatory Visit: Payer: Medicare Other | Admitting: Pain Medicine

## 2019-12-09 LAB — URINE CULTURE: Culture: >=100000 — AB

## 2019-12-10 NOTE — Progress Notes (Signed)
ED Antimicrobial Stewardship Positive Culture Follow Up   Aaron Gibbs is an 85 y.o. male who presented to [facility] on 12/06/2019 with a chief complaint of  Chief Complaint  Patient presents with  . Anxiety    Recent Results (from the past 720 hour(s))  Urine culture     Status: Abnormal   Collection Time: 12/06/19 10:55 PM   Specimen: Urine, Random  Result Value Ref Range Status   Specimen Description   Final    URINE, RANDOM Performed at [facility], [address] [place], [place] [address]    Special Requests   Final    NONE Performed at [facility], [place] [place],  [address]    Culture >=100,000 COLONIES/mL ENTEROCOCCUS FAECALIS (A)  Final   Report Status 12/09/2019 FINAL  Final   Organism ID, Bacteria ENTEROCOCCUS FAECALIS (A)  Final      Susceptibility   Enterococcus faecalis - MIC*    AMPICILLIN <=2 SENSITIVE Sensitive     NITROFURANTOIN <=16 SENSITIVE Sensitive     VANCOMYCIN 1 SENSITIVE Sensitive     * >=100,000 COLONIES/mL ENTEROCOCCUS FAECALIS   [x]  Patient discharged originally without antimicrobial agent and treatment is now indicated  Patent is currently a patient at [facility].  Have spoken with nurse caring for patient Aaron Gibbs) and updated her with the culture results.  Lu Duffel, PharmD, BCPS Clinical Pharmacist 12/10/2019 4:23 PM

## 2019-12-12 DIAGNOSIS — F32A Depression, unspecified: Secondary | ICD-10-CM | POA: Insufficient documentation

## 2019-12-13 ENCOUNTER — Inpatient Hospital Stay
Admission: EM | Admit: 2019-12-13 | Discharge: 2019-12-16 | DRG: 684 | Disposition: A | Discharge status: Skilled Nursing Facility | Payer: Medicare Other | Attending: Internal Medicine | Admitting: Internal Medicine

## 2019-12-13 ENCOUNTER — Emergency Department: Payer: Medicare Other

## 2019-12-13 ENCOUNTER — Other Ambulatory Visit: Payer: Self-pay

## 2019-12-13 DIAGNOSIS — F418 Other specified anxiety disorders: Secondary | ICD-10-CM

## 2019-12-13 DIAGNOSIS — Z79891 Long term (current) use of opiate analgesic: Secondary | ICD-10-CM

## 2019-12-13 DIAGNOSIS — J449 Chronic obstructive pulmonary disease, unspecified: Secondary | ICD-10-CM | POA: Diagnosis present

## 2019-12-13 DIAGNOSIS — R296 Repeated falls: Secondary | ICD-10-CM | POA: Diagnosis present

## 2019-12-13 DIAGNOSIS — Z915 Personal history of self-harm: Secondary | ICD-10-CM

## 2019-12-13 DIAGNOSIS — F17220 Nicotine dependence, chewing tobacco, uncomplicated: Secondary | ICD-10-CM | POA: Diagnosis present

## 2019-12-13 DIAGNOSIS — R630 Anorexia: Secondary | ICD-10-CM | POA: Diagnosis present

## 2019-12-13 DIAGNOSIS — K219 Gastro-esophageal reflux disease without esophagitis: Secondary | ICD-10-CM | POA: Diagnosis present

## 2019-12-13 DIAGNOSIS — E860 Dehydration: Secondary | ICD-10-CM | POA: Diagnosis present

## 2019-12-13 DIAGNOSIS — M25561 Pain in right knee: Secondary | ICD-10-CM | POA: Diagnosis present

## 2019-12-13 DIAGNOSIS — N179 Acute kidney failure, unspecified: Principal | ICD-10-CM | POA: Diagnosis present

## 2019-12-13 DIAGNOSIS — E785 Hyperlipidemia, unspecified: Secondary | ICD-10-CM | POA: Diagnosis present

## 2019-12-13 DIAGNOSIS — Z6834 Body mass index (BMI) 34.0-34.9, adult: Secondary | ICD-10-CM

## 2019-12-13 DIAGNOSIS — Z20822 Contact with and (suspected) exposure to covid-19: Secondary | ICD-10-CM | POA: Diagnosis present

## 2019-12-13 DIAGNOSIS — H547 Unspecified visual loss: Secondary | ICD-10-CM | POA: Diagnosis present

## 2019-12-13 DIAGNOSIS — G894 Chronic pain syndrome: Secondary | ICD-10-CM | POA: Diagnosis present

## 2019-12-13 DIAGNOSIS — Z79899 Other long term (current) drug therapy: Secondary | ICD-10-CM

## 2019-12-13 DIAGNOSIS — H409 Unspecified glaucoma: Secondary | ICD-10-CM | POA: Diagnosis present

## 2019-12-13 DIAGNOSIS — M792 Neuralgia and neuritis, unspecified: Secondary | ICD-10-CM

## 2019-12-13 DIAGNOSIS — W01190A Fall on same level from slipping, tripping and stumbling with subsequent striking against furniture, initial encounter: Secondary | ICD-10-CM | POA: Diagnosis present

## 2019-12-13 DIAGNOSIS — I959 Hypotension, unspecified: Secondary | ICD-10-CM | POA: Diagnosis present

## 2019-12-13 DIAGNOSIS — Z85828 Personal history of other malignant neoplasm of skin: Secondary | ICD-10-CM

## 2019-12-13 DIAGNOSIS — R911 Solitary pulmonary nodule: Secondary | ICD-10-CM | POA: Diagnosis present

## 2019-12-13 DIAGNOSIS — Z8249 Family history of ischemic heart disease and other diseases of the circulatory system: Secondary | ICD-10-CM

## 2019-12-13 DIAGNOSIS — I10 Essential (primary) hypertension: Secondary | ICD-10-CM | POA: Diagnosis present

## 2019-12-13 DIAGNOSIS — Y92009 Unspecified place in unspecified non-institutional (private) residence as the place of occurrence of the external cause: Secondary | ICD-10-CM

## 2019-12-13 DIAGNOSIS — Z9049 Acquired absence of other specified parts of digestive tract: Secondary | ICD-10-CM

## 2019-12-13 DIAGNOSIS — Z881 Allergy status to other antibiotic agents status: Secondary | ICD-10-CM

## 2019-12-13 DIAGNOSIS — M810 Age-related osteoporosis without current pathological fracture: Secondary | ICD-10-CM | POA: Diagnosis present

## 2019-12-13 DIAGNOSIS — M25562 Pain in left knee: Secondary | ICD-10-CM | POA: Diagnosis present

## 2019-12-13 DIAGNOSIS — Z818 Family history of other mental and behavioral disorders: Secondary | ICD-10-CM

## 2019-12-13 DIAGNOSIS — W19XXXA Unspecified fall, initial encounter: Secondary | ICD-10-CM

## 2019-12-13 DIAGNOSIS — Z7982 Long term (current) use of aspirin: Secondary | ICD-10-CM

## 2019-12-13 LAB — COMPREHENSIVE METABOLIC PANEL
ALT: 21 U/L (ref 0–44)
AST: 38 U/L (ref 15–41)
Albumin: 4.4 g/dL (ref 3.5–5.0)
Alkaline Phosphatase: 55 U/L (ref 38–126)
Anion gap: 11 (ref 5–15)
BUN: 40 mg/dL — ABNORMAL HIGH (ref 8–23)
CO2: 26 mmol/L (ref 22–32)
Calcium: 9.9 mg/dL (ref 8.9–10.3)
Chloride: 107 mmol/L (ref 98–111)
Creatinine, Ser: 2.43 mg/dL — ABNORMAL HIGH (ref 0.61–1.24)
GFR calc Af Amer: 28 mL/min — ABNORMAL LOW (ref >60)
GFR calc non Af Amer: 24 mL/min — ABNORMAL LOW (ref >60)
Glucose, Bld: 141 mg/dL — ABNORMAL HIGH (ref 70–99)
Potassium: 3.6 mmol/L (ref 3.5–5.1)
Sodium: 144 mmol/L (ref 135–145)
Total Bilirubin: 1 mg/dL (ref 0.3–1.2)
Total Protein: 7.6 g/dL (ref 6.5–8.1)

## 2019-12-13 LAB — CBC WITH DIFFERENTIAL/PLATELET
Abs Immature Granulocytes: 0.01 10*3/uL (ref 0.00–0.07)
Basophils Absolute: 0.1 10*3/uL (ref 0.0–0.1)
Basophils Relative: 1 %
Eosinophils Absolute: 0.8 10*3/uL — ABNORMAL HIGH (ref 0.0–0.5)
Eosinophils Relative: 14 %
HCT: 43.6 % (ref 39.0–52.0)
Hemoglobin: 14.4 g/dL (ref 13.0–17.0)
Immature Granulocytes: 0 %
Lymphocytes Relative: 17 %
Lymphs Abs: 1 10*3/uL (ref 0.7–4.0)
MCH: 29.1 pg (ref 26.0–34.0)
MCHC: 33 g/dL (ref 30.0–36.0)
MCV: 88.1 fL (ref 80.0–100.0)
Monocytes Absolute: 1.4 10*3/uL — ABNORMAL HIGH (ref 0.1–1.0)
Monocytes Relative: 23 %
Neutro Abs: 2.7 10*3/uL (ref 1.7–7.7)
Neutrophils Relative %: 45 %
Platelets: 186 10*3/uL (ref 150–400)
RBC: 4.95 MIL/uL (ref 4.22–5.81)
RDW: 15 % (ref 11.5–15.5)
WBC: 6 10*3/uL (ref 4.0–10.5)
nRBC: 0 % (ref 0.0–0.2)

## 2019-12-13 LAB — LACTIC ACID, PLASMA: Lactic Acid, Venous: 1.1 mmol/L (ref 0.5–1.9)

## 2019-12-13 MED ORDER — LACTATED RINGERS IV BOLUS
1000.0000 mL | Freq: Once | INTRAVENOUS | Status: AC
  Administered 2019-12-13: 1000 mL via INTRAVENOUS

## 2019-12-13 MED ORDER — UMECLIDINIUM-VILANTEROL 62.5-25 MCG/INH IN AEPB
1.0000 | INHALATION_SPRAY | Freq: Every day | RESPIRATORY_TRACT | Status: DC
  Administered 2019-12-14 – 2019-12-15 (×2): 1 via RESPIRATORY_TRACT
  Filled 2019-12-13: qty 14

## 2019-12-13 MED ORDER — OXYCODONE HCL 5 MG PO TABS
10.0000 mg | ORAL_TABLET | Freq: Three times a day (TID) | ORAL | Status: DC | PRN
  Administered 2019-12-14 – 2019-12-16 (×5): 10 mg via ORAL
  Filled 2019-12-13 (×5): qty 2

## 2019-12-13 MED ORDER — ALBUTEROL SULFATE (2.5 MG/3ML) 0.083% IN NEBU: 3.0000 mL | INHALATION_SOLUTION | Freq: Four times a day (QID) | RESPIRATORY_TRACT | Status: DC | PRN

## 2019-12-13 MED ORDER — CLONAZEPAM 0.5 MG PO TABS
0.5000 mg | ORAL_TABLET | Freq: Two times a day (BID) | ORAL | Status: DC | PRN
  Administered 2019-12-14 – 2019-12-15 (×5): 0.5 mg via ORAL
  Filled 2019-12-13 (×5): qty 1

## 2019-12-13 MED ORDER — ACETAMINOPHEN 325 MG PO TABS
650.0000 mg | ORAL_TABLET | Freq: Four times a day (QID) | ORAL | Status: DC | PRN
  Administered 2019-12-15 (×2): 650 mg via ORAL
  Filled 2019-12-13 (×2): qty 2

## 2019-12-13 MED ORDER — RISPERIDONE 1 MG PO TABS
0.5000 mg | ORAL_TABLET | Freq: Two times a day (BID) | ORAL | Status: DC
  Administered 2019-12-14 – 2019-12-16 (×6): 0.5 mg via ORAL
  Filled 2019-12-13 (×7): qty 0.5

## 2019-12-13 MED ORDER — TRAZODONE HCL 100 MG PO TABS
100.0000 mg | ORAL_TABLET | Freq: Every day | ORAL | Status: DC
  Administered 2019-12-14 – 2019-12-15 (×3): 100 mg via ORAL
  Filled 2019-12-13 (×3): qty 1

## 2019-12-13 MED ORDER — ACETAMINOPHEN 650 MG RE SUPP: 650.0000 mg | Freq: Four times a day (QID) | RECTAL | Status: DC | PRN

## 2019-12-13 MED ORDER — ONDANSETRON HCL 4 MG/2ML IJ SOLN: 4.0000 mg | Freq: Four times a day (QID) | INTRAMUSCULAR | Status: DC | PRN

## 2019-12-13 MED ORDER — ONDANSETRON HCL 4 MG PO TABS: 4.0000 mg | ORAL_TABLET | Freq: Four times a day (QID) | ORAL | Status: DC | PRN

## 2019-12-13 MED ORDER — HEPARIN SODIUM (PORCINE) 5000 UNIT/ML IJ SOLN
5000.0000 [IU] | Freq: Three times a day (TID) | INTRAMUSCULAR | Status: DC
  Administered 2019-12-14 – 2019-12-16 (×8): 5000 [IU] via SUBCUTANEOUS
  Filled 2019-12-13 (×8): qty 1

## 2019-12-13 MED ORDER — PREGABALIN 75 MG PO CAPS: 150.0000 mg | ORAL_CAPSULE | Freq: Three times a day (TID) | ORAL | Status: DC

## 2019-12-13 MED ORDER — DULOXETINE HCL 60 MG PO CPEP
60.0000 mg | ORAL_CAPSULE | Freq: Every day | ORAL | Status: DC
  Administered 2019-12-14 – 2019-12-16 (×3): 60 mg via ORAL
  Filled 2019-12-13 (×3): qty 1

## 2019-12-13 MED ORDER — SODIUM CHLORIDE 0.9 % IV SOLN: INTRAVENOUS | Status: AC

## 2019-12-13 MED ORDER — SENNOSIDES-DOCUSATE SODIUM 8.6-50 MG PO TABS: 1.0000 | ORAL_TABLET | Freq: Every evening | ORAL | Status: DC | PRN

## 2019-12-13 NOTE — ED Notes (Signed)
Pt's daughter reported to this RN that pt has taken benadryl and his anxiety medicaiton prior to coming to the ED. Pt c/o being cold despite having warm blankets.  Pt's daughter reported that pt has had a rash on inner thigh for 1 week.

## 2019-12-13 NOTE — ED Notes (Signed)
Blood Cultures obtained by Jenny Reichmann, NT. For use by MD if needed.

## 2019-12-13 NOTE — ED Notes (Signed)
Pt states that he fell 4-5 hours ago and that he is having lower back pain at this time. Patient denies loss of consciousness and states that he hasn't been walking as well with his walker lately.
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
## 2019-12-13 NOTE — ED Notes (Signed)
Pt to xray

## 2019-12-13 NOTE — ED Notes (Signed)
Pt screaming "help" "let me go" constantly in lobby. Pt assisted to recliner chair for comfort because he refuses to sit in a wheelchair. Pt requesting "put me in psych".

## 2019-12-13 NOTE — H&P (Signed)
History and Physical    Aaron Gibbs ALP:[id number] DOB: April 24, 1935 DOA: 12/13/2019  PCP: Olin Hauser, DO  Patient coming from: Home  I have personally briefly reviewed patient's old medical records in [place]  Chief Complaint: Fall  HPI: Aaron Gibbs is a 85 y.o. male with medical history significant for COPD, hypertension, hyperlipidemia, anxiety/depression with history of suicidal ideation, and chronic pain syndrome who presents to the ED for evaluation after a fall.  Patient states he normally ambulates with the use of a walker.  He was getting up to go use the restroom earlier today and while looking for his walker tripped and fell to the ground.  He says he landed on his left side and thinks he might of hit his head on the bed as well.  He denies any significant injury or loss of consciousness.  He denied any associated lightheadedness, dizziness, chest pain, palpitations, dyspnea, cough, nausea, vomiting, abdominal pain, dysuria.  He says he has been feeling more thirsty than usual and thinks he is dehydrated.  He denies any NSAID use.  Per ED triage notes, EMS were called and he was reportedly found to have a low blood pressure.  He refused transport to the ED via ambulance but was brought to the ED for further evaluation.  Of note, patient did have a recent stay at [facility] ED for suicidal ideation from 12/08/2019-12/12/2019.  He was followed by behavioral health with eventual recommendation and discharge to home.  Patient states that he feels that his mood is significantly improved now.  He denies any suicidal or homicidal ideation.  ED Course:  Initial vitals showed BP 104/59, pulse 94, RR 20, temp 97.6 Fahrenheit, SPO2 97% on room air.  Labs are notable for WBC 6.0, hemoglobin 14.4, platelets 186,000, sodium 144, potassium 3.6, bicarb 26, BUN 40, creatinine 2.43 (previously 1.07 on 12/08/2019), serum glucose 141, LFTs within normal limits, lactic acid  1.1.  SARS-CoV-2 PCR is collected and pending.  Portable chest x-ray shows stable left upper lobe pulmonary nodule without focal consolidation, edema, or effusion.  CT head without contrast is negative for evidence of acute intracranial injury.  CT maxillofacial without contrast is negative for acute facial fracture.  Indeterminate 11 mm soft tissue nodule of the right parotid is seen.  CT cervical spine without contrast is negative for acute cervical spine fracture.  Indeterminate lucent lesion of C2 was noted.  Patient was given 1 L LR and the hospitalist service was consulted to admit for further evaluation and management.  Review of Systems: All systems reviewed and are negative except as documented in history of present illness above.   Past Medical History:  Diagnosis Date  . Anxiety   . Cancer ([place])    skin  . COPD (chronic obstructive pulmonary disease) ([place])   . Glaucoma   . Osteoporosis    osteopenia    Past Surgical History:  Procedure Laterality Date  . CHOLECYSTECTOMY    . TRANSURETHRAL RESECTION OF PROSTATE N/A 2008    Social History:  reports that he has quit smoking. He quit after 15.00 years of use. His smokeless tobacco use includes chew. He reports current alcohol use. He reports that he does not use drugs.  Allergies  Allergen Reactions  . Azithromycin Shortness Of Breath    Family History  Problem Relation Age of Onset  . Depression Mother   . Heart disease Father      Prior to Admission medications   Medication Sig Start  Date End Date Taking? Authorizing Provider  albuterol (VENTOLIN HFA) 108 (90 Base) MCG/ACT inhaler INHALE 1-2 PUFFS INTO THE LUNGS EVERY 6 HOURS AS NEEDED 09/22/19   Parks Ranger, Devonne Doughty, DO  amLODipine (NORVASC) 10 MG tablet Take 1 tablet (10 mg total) by mouth daily. 11/18/19   Karamalegos, Devonne Doughty, DO  aspirin EC 81 MG tablet Take 81 mg by mouth daily.    [provider]  bacitracin ophthalmic ointment 1  application as needed. Apply to affected eye 08/01/19   [provider]  buPROPion (WELLBUTRIN SR) 150 MG 12 hr tablet Take 1 tablet (150 mg total) by mouth 2 (two) times daily. 12/07/19 02/05/20  Blake Divine, MD  clonazePAM (KLONOPIN) 0.5 MG tablet Take 1 tablet (0.5 mg total) by mouth 2 (two) times daily as needed for anxiety. 12/07/19 01/06/20  Blake Divine, MD  diclofenac sodium (VOLTAREN) 1 % GEL Apply 1 application topically as needed. 03/28/19   [provider]  DULoxetine (CYMBALTA) 20 MG capsule Take 4 capsules (80 mg total) by mouth daily. 12/07/19 02/05/20  Blake Divine, MD  fenofibrate (TRICOR) 145 MG tablet Take 1 tablet (145 mg total) by mouth daily. 11/18/19   Karamalegos, Devonne Doughty, DO  ferrous sulfate 325 (65 FE) MG tablet Take 325 mg by mouth daily with breakfast.    [provider]  meloxicam (MOBIC) 15 MG tablet Take 1 tablet (15 mg total) by mouth daily. 10/14/19 04/11/20  Milinda Pointer, MD  Multiple Vitamin (MULTIVITAMIN WITH MINERALS) TABS tablet Take 1 tablet by mouth daily.    [provider]  Oxycodone HCl 10 MG TABS Take 1 tablet (10 mg total) by mouth 3 (three) times daily as needed. Must last 30 days. 10/24/19 11/23/19  Milinda Pointer, MD  Oxycodone HCl 10 MG TABS Take 1 tablet (10 mg total) by mouth 3 (three) times daily as needed. Must last 30 days. 11/23/19 12/23/19  Milinda Pointer, MD  Oxycodone HCl 10 MG TABS Take 1 tablet (10 mg total) by mouth 3 (three) times daily as needed. Must last 30 days. 12/23/19 01/22/20  Milinda Pointer, MD  pantoprazole (PROTONIX) 40 MG tablet Take 40 mg by mouth daily.     [provider]  prednisoLONE acetate (PRED FORTE) 1 % ophthalmic suspension Place 2 drops into the left eye daily. Patient not taking: Reported on 10/22/2019 02/18/19   [name], MD  pregabalin (LYRICA) 150 MG capsule Take 1 capsule (150 mg total) by mouth 3 (three) times daily. Must last 30 days 10/14/19  04/11/20  Milinda Pointer, MD  risperiDONE (RISPERDAL) 2 MG tablet Take 1 tablet (2 mg total) by mouth at bedtime. 12/07/19 02/05/20  Blake Divine, MD  timolol (TIMOPTIC) 0.5 % ophthalmic solution 1 drop 2 (two) times daily. Apply one drop to eye(s) 03/12/19   [provider]  traZODone (DESYREL) 50 MG tablet TAKE 1 TABLET BY MOUTH AT BEDTIME 08/28/19   Karamalegos, Alexander J, DO  umeclidinium-vilanterol (ANORO ELLIPTA) 62.5-25 MCG/INH AEPB Inhale 1 puff into the lungs daily.    [provider]    Physical Exam: Vitals:   12/13/19 1911 12/13/19 2055  BP: (!) 104/59 115/64  Pulse: 94 88  Resp: 20 20  Temp: 97.6 F (36.4 C)   TempSrc: Oral   SpO2: 97% 96%  Weight: 113.4 kg   Height: 5\' 11"  (1.803 m)    Constitutional: Chronically ill-appearing elderly man sitting up in chair, NAD, calm, comfortable Eyes: PERRL, lids and conjunctivae normal ENMT: Mucous membranes  are dry. Posterior pharynx clear of any exudate or lesions. Neck: normal, supple, no masses. Respiratory: clear to auscultation bilaterally, no wheezing, no crackles. Normal respiratory effort. No accessory muscle use.  Cardiovascular: Regular rate and rhythm, no murmurs / rubs / gallops. No extremity edema. 2+ pedal pulses. Abdomen: no tenderness, no masses palpated. No hepatosplenomegaly. Bowel sounds positive.  Musculoskeletal: no clubbing / cyanosis. No joint deformity upper and lower extremities. Good ROM, no contractures. Normal muscle tone.  Skin: Dry skin, no rashes, lesions, ulcers. No induration Neurologic: CN 2-12 grossly intact. Sensation intact, Strength 5/5 in all 4.  Psychiatric: Alert and oriented x 3.  Flat affect.  Denies SI/HI.  Feels that his mood is much improved.  Labs on Admission: I have personally reviewed following labs and imaging studies  CBC: Recent Labs  Lab 12/06/19 2255 12/13/19 1929  WBC 4.4 6.0  NEUTROABS 1.8 2.7  HGB 12.2* 14.4  HCT 38.1* 43.6  MCV 90.1 88.1   PLT 168 409   Basic Metabolic Panel: Recent Labs  Lab 12/06/19 2255 12/13/19 1929  NA 137 144  K 4.3 3.6  CL 103 107  CO2 28 26  GLUCOSE 107* 141*  BUN 16 40*  CREATININE 1.13 2.43*  CALCIUM 9.1 9.9   GFR: Estimated Creatinine Clearance: 30 mL/min (A) (by C-G formula based on SCr of 2.43 mg/dL (H)). Liver Function Tests: Recent Labs  Lab 12/06/19 2255 12/13/19 1929  AST 26 38  ALT 22 21  ALKPHOS 46 55  BILITOT 0.7 1.0  PROT 6.8 7.6  ALBUMIN 3.8 4.4   No results for input(s): LIPASE, AMYLASE in the last 168 hours. No results for input(s): AMMONIA in the last 168 hours. Coagulation Profile: Recent Labs  Lab 12/06/19 2255  INR 1.0   Cardiac Enzymes: No results for input(s): CKTOTAL, CKMB, CKMBINDEX, TROPONINI in the last 168 hours. BNP (last 3 results) No results for input(s): PROBNP in the last 8760 hours. HbA1C: No results for input(s): HGBA1C in the last 72 hours. CBG: No results for input(s): GLUCAP in the last 168 hours. Lipid Profile: No results for input(s): CHOL, HDL, LDLCALC, TRIG, CHOLHDL, LDLDIRECT in the last 72 hours. Thyroid Function Tests: No results for input(s): TSH, T4TOTAL, FREET4, T3FREE, THYROIDAB in the last 72 hours. Anemia Panel: No results for input(s): VITAMINB12, FOLATE, FERRITIN, TIBC, IRON, RETICCTPCT in the last 72 hours. Urine analysis:    Component Value Date/Time   COLORURINE YELLOW (A) 12/06/2019 2255   APPEARANCEUR CLOUDY (A) 12/06/2019 2255   LABSPEC 1.013 12/06/2019 2255   PHURINE 8.0 12/06/2019 2255   GLUCOSEU NEGATIVE 12/06/2019 2255   HGBUR NEGATIVE 12/06/2019 2255   [place] NEGATIVE 12/06/2019 [place] 12/06/2019 2255   PROTEINUR 30 (A) 12/06/2019 2255   NITRITE NEGATIVE 12/06/2019 2255   LEUKOCYTESUR LARGE (A) 12/06/2019 2255    Radiological Exams on Admission: DG Chest 1 View  Result Date: 12/13/2019 CLINICAL DATA:  Un witnessed fall, hypotension EXAM: CHEST  1 VIEW COMPARISON:   09/25/2019 FINDINGS: Single frontal view of the chest demonstrates an unremarkable cardiac silhouette. The left upper lobe nodule seen on recent chest CT is again identified overlying the left anterior fourth rib, stable. No airspace disease, effusion, or pneumothorax. No acute displaced fracture. IMPRESSION: 1. Stable left upper lobe pulmonary nodule. PET CT again recommended if not performed in the interim. 2. Otherwise no acute intrathoracic process.  Cough Electronically Signed   By: Randa Ngo M.D.   On: 12/13/2019 20:01  CT Head Wo Contrast  Result Date: 12/13/2019 CLINICAL DATA:  Fall EXAM: CT HEAD WITHOUT CONTRAST TECHNIQUE: Contiguous axial images were obtained from the base of the skull through the vertex without intravenous contrast. COMPARISON:  12/07/2019 FINDINGS: Brain: There is no acute intracranial hemorrhage, mass effect, or edema. No new loss of gray-white differentiation. There is no extra-axial fluid collection. Ventricles and sulci are stable in size and configuration. Patchy hypoattenuation in the supratentorial white matter likely reflects stable chronic microvascular ischemic changes. Vascular: No hyperdense vessel or unexpected calcification. Skull: Calvarium is unremarkable. Sinuses/Orbits: No acute finding. Other: None. IMPRESSION: No evidence of acute intracranial injury. Electronically Signed   By: Macy Mis M.D.   On: 12/13/2019 20:37   CT Cervical Spine Wo Contrast  Result Date: 12/13/2019 CLINICAL DATA:  Fall EXAM: CT CERVICAL SPINE WITHOUT CONTRAST TECHNIQUE: Multidetector CT imaging of the cervical spine was performed without intravenous contrast. Multiplanar CT image reconstructions were also generated. COMPARISON:  None. FINDINGS: Alignment: Anteroposterior alignment is maintained. Skull base and vertebrae: Decreased osseous mineralization. Diffuse bridging osteophytes/syndesmophytes. No acute cervical spine fracture. Indeterminate lucent lesion within the right  C2 lateral mass. Soft tissues and spinal canal: No prevertebral fluid or swelling. No visible canal hematoma. Disc levels: Multilevel degenerative changes are present including disc space narrowing, endplate osteophytes, and facet and uncovertebral hypertrophy. Upper chest: No apical lung mass. Other: None. IMPRESSION: No acute cervical spine fracture. Indeterminate lucent lesion of C2. Electronically Signed   By: Macy Mis M.D.   On: 12/13/2019 20:51   CT Maxillofacial Wo Contrast  Result Date: 12/13/2019 CLINICAL DATA:  Fall EXAM: CT MAXILLOFACIAL WITHOUT CONTRAST TECHNIQUE: Multidetector CT imaging of the maxillofacial structures was performed. Multiplanar CT image reconstructions were also generated. COMPARISON:  None. FINDINGS: Osseous: No acute facial fracture. Orbits: No intraorbital hematoma. Sinuses: Minor mucosal thickening. Soft tissues: There is a 11 mm soft tissue nodule of the superficial right parotid. Limited intracranial: Dictated separately. IMPRESSION: No acute facial fracture. Indeterminate 11 mm soft tissue nodule of the right parotid. This may reflect a mildly enlarged lymph node or a primary parotid neoplasm. Electronically Signed   By: Macy Mis M.D.   On: 12/13/2019 20:55    EKG: Independently reviewed. Normal sinus rhythm without acute ischemic changes.  PR interval is shorter when compared to prior EKG.  Assessment/Plan Principal Problem:   AKI (acute kidney injury) ([place]) Active Problems:   Essential hypertension   COPD (chronic obstructive pulmonary disease) ([place])   Fall at home, initial encounter   Depression with anxiety  Aaron Gibbs is a 85 y.o. male with medical history significant for COPD, hypertension, hyperlipidemia, anxiety/depression with history of suicidal ideation, and chronic pain syndrome who is admitted with AKI after a fall at home.  Acute kidney injury: Suspect prerenal from poor oral intake/dehydration.  Also possible hypoperfusion  from reported low blood pressure at home on EMS arrival. -Continue IV fluid resuscitation overnight -Check urinalysis, urine sodium and creatinine -Repeat labs in the morning  Fall at home: Reportedly mechanical fall although he does appear dehydrated with possible orthostasis contributing.  Normally ambulates with the use of a walker. -Check orthostatic vital signs -Fall precautions -PT/OT eval  Depression/anxiety: With recent stay at [facility] ED for suicidal ideation.  Patient currently denies any SI/HI and feels that his mood is much improved.  Will resume medications as outlined from [facility] behavioral recommendations. -Continue Cymbalta 60 mg daily -Continue trazodone 100 g nightly -Continue Klonopin 0.5 mg twice daily as needed  for anxiety -Continue Risperdal 0.5 mg twice daily  Hypertension: Hold home amlodipine in setting of dehydration, pending orthostatic vitals.  COPD: Currently stable.  Continue Anoro Ellipta and as needed albuterol.  Chronic pain syndrome: Follows with pain management, currently prescribed oxycodone and Lyrica. -Continue oxycodone 10 mg 3 times daily as needed with hold parameters -Holding home Lyrica in setting of diminished renal function  Left upper lobe pulmonary nodule: Initially seen on CTA chest 12/07/2019, measured as 17 x 16 mm with slightly irregular margins suspicious for neoplasm, and again seen on chest x-ray 12/13/2019..  Further evaluation with PET/CT versus short interval follow-up CT scan recommended.  Right parotid nodule: Indeterminate 11 mm soft tissue nodule of the right parotid seen on CT maxillofacial.  This could be mildly enlarged lymph node or a primary parotid neoplasm.  DVT prophylaxis: Subcutaneous heparin Code Status: Full code, confirmed with patient Family Communication: Discussed with patient, he has discussed with family Disposition Plan: From home, discharge pending improvement in renal function and PT/OT eval Consults  called: None Admission status:  Status is: Observation  The patient remains OBS appropriate and will d/c before 2 midnights.  Dispo: The patient is from: Home              Anticipated d/c is to: TBD pending progress of renal function and PT/OT eval              Anticipated d/c date is: 1 day              Patient currently is not medically stable to d/c.  Zada Finders MD [facility]  If 7PM-7AM, please contact night-coverage [email or web address]  12/13/2019, 10:25 PM

## 2019-12-13 NOTE — ED Triage Notes (Addendum)
Pt arrives via POV with daughter after an unwitnessed fall in his bedroom around 1745 this evening per pt's daughter. Pt's daughter reports pt's BP was low when EMS checked it at his house. Pt refused to transport via ambulance.  Pt states he hit his head and his "face is hurting where he hit it". No physical evidence of trauma to pt's head from fall. NAD noted, VSS.

## 2019-12-13 NOTE — ED Notes (Signed)
Pt to ct 

## 2019-12-13 NOTE — ED Notes (Signed)
Pt. Was dressed out by this tech and was provided with hospital attire, blue top and blue pants, Pt. Belonging were placed inside a bag with label of his information.  Pt. Belonging are :   [employer] crew pants  [employer] and black socks  [employer]

## 2019-12-13 NOTE — ED Provider Notes (Signed)
[facility] Emergency Department Provider Note   ____________________________________________   First MD Initiated Contact with Patient 12/13/19 2128     (approximate)  I have reviewed the triage vital signs and the nursing notes.   HISTORY  Chief Complaint Fall    HPI Aaron Gibbs is a 85 y.o. male with past medical history of depression, anxiety, COPD, hypertension, and hyperlipidemia who presents to the ED for fall.  Patient reports that he was at home earlier today when he lost control of his walker and fell forward, striking his face.  He believes he was knocked out for a few seconds and when EMS arrived he was found to have a borderline low blood pressure.  He refused transportation by ambulance and was brought to the ED by his daughter.  He currently complains of facial pain along with pain to his left lateral chest wall.  He does not take any blood thinners and denies any numbness or weakness.        Past Medical History:  Diagnosis Date  . Anxiety   . Cancer ([place])    skin  . COPD (chronic obstructive pulmonary disease) ([place])   . Glaucoma   . Osteoporosis    osteopenia    Patient Active Problem List   Diagnosis Date Noted  . Aspiration pneumonia ([place]) 09/25/2019  . COPD (chronic obstructive pulmonary disease) ([place])   . HLD (hyperlipidemia)   . Iron deficiency anemia   . Fall   . Medial meniscal tear, sequela (Left) 06/10/2019  . Patellar tendinosis (Right) 06/10/2019  . Acute respiratory failure with hypoxia ([place]) 04/09/2019  . Community acquired pneumonia 03/29/2019  . Lateral meniscal tear, sequela (Left) 03/03/2019  . Palpitation 02/13/2019  . Preop testing 11/14/2018  . Noncompliance with medication treatment due to overuse of medication 10/23/2018  . Neurogenic pain 08/20/2018  . Atherosclerotic peripheral vascular disease ([place]) 07/24/2018  . Tricompartment osteoarthritis of knee (Left) 07/24/2018  . Osteoarthritis of  knee (Bilateral) 07/24/2018  . Osteoarthritis of patellofemoral joint (Right) 07/24/2018  . History of suicide attempt (06/12/18) 07/24/2018  . Long term current use of opiate analgesic 07/10/2018  . Pharmacologic therapy 07/10/2018  . Disorder of skeletal system 07/10/2018  . Problems influencing health status 07/10/2018  . Suicide attempt ([place]) (05/13/18) 06/26/2018  . Acute metabolic encephalopathy 01/30/4817  . 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 Major depressive disorder, recurrent episode, severe ([place]) 06/04/2018  . Blindness 05/10/2018  . Suicidal ideation 05/10/2018  . Chronic pain syndrome 05/01/2018  . DISH (diffuse idiopathic skeletal hyperostosis) 05/01/2018  . Osteoarthritis of multiple joints 05/01/2018  . Chronic knee pain (Primary Area of Pain) (Bilateral) (L>R) 05/01/2018  . Retinitis pigmentosa of both eyes 05/01/2018  . Glaucoma of both eyes 05/01/2018  . Essential hypertension 05/01/2018  . Chronic low back pain (Bilateral) w/o sciatica 05/01/2018  . GERD (gastroesophageal reflux disease) 05/01/2018  . 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 Therapeutic opioid-induced constipation (OIC) 05/01/2018  . Centrilobular emphysema ([place]) 05/01/2018    Past Surgical History:  Procedure Laterality Date  . CHOLECYSTECTOMY    . TRANSURETHRAL RESECTION OF PROSTATE N/A 2008    Prior to Admission medications   Medication Sig Start Date End Date Taking? Authorizing Provider  albuterol (VENTOLIN HFA) 108 (90 Base) MCG/ACT inhaler INHALE 1-2 PUFFS INTO THE LUNGS EVERY 6 HOURS AS NEEDED 09/22/19   Parks Ranger, Devonne Doughty, DO  amLODipine (NORVASC) 10 MG tablet Take 1 tablet (10 mg total) by mouth daily. 11/18/19  Karamalegos, Devonne Doughty, DO  aspirin EC 81 MG tablet Take 81 mg by mouth daily.    [provider]  bacitracin ophthalmic ointment 1 application as needed. Apply to affected eye 08/01/19   [provider]  buPROPion (WELLBUTRIN SR)  150 MG 12 hr tablet Take 1 tablet (150 mg total) by mouth 2 (two) times daily. 12/07/19 02/05/20  Blake Divine, MD  clonazePAM (KLONOPIN) 0.5 MG tablet Take 1 tablet (0.5 mg total) by mouth 2 (two) times daily as needed for anxiety. 12/07/19 01/06/20  Blake Divine, MD  diclofenac sodium (VOLTAREN) 1 % GEL Apply 1 application topically as needed. 03/28/19   [provider]  DULoxetine (CYMBALTA) 20 MG capsule Take 4 capsules (80 mg total) by mouth daily. 12/07/19 02/05/20  Blake Divine, MD  fenofibrate (TRICOR) 145 MG tablet Take 1 tablet (145 mg total) by mouth daily. 11/18/19   Karamalegos, Devonne Doughty, DO  ferrous sulfate 325 (65 FE) MG tablet Take 325 mg by mouth daily with breakfast.    [provider]  meloxicam (MOBIC) 15 MG tablet Take 1 tablet (15 mg total) by mouth daily. 10/14/19 04/11/20  Milinda Pointer, MD  Multiple Vitamin (MULTIVITAMIN WITH MINERALS) TABS tablet Take 1 tablet by mouth daily.    [provider]  Oxycodone HCl 10 MG TABS Take 1 tablet (10 mg total) by mouth 3 (three) times daily as needed. Must last 30 days. 10/24/19 11/23/19  Milinda Pointer, MD  Oxycodone HCl 10 MG TABS Take 1 tablet (10 mg total) by mouth 3 (three) times daily as needed. Must last 30 days. 11/23/19 12/23/19  Milinda Pointer, MD  Oxycodone HCl 10 MG TABS Take 1 tablet (10 mg total) by mouth 3 (three) times daily as needed. Must last 30 days. 12/23/19 01/22/20  Milinda Pointer, MD  pantoprazole (PROTONIX) 40 MG tablet Take 40 mg by mouth daily.     [provider]  prednisoLONE acetate (PRED FORTE) 1 % ophthalmic suspension Place 2 drops into the left eye daily. Patient not taking: Reported on 10/22/2019 02/18/19   [name], MD  pregabalin (LYRICA) 150 MG capsule Take 1 capsule (150 mg total) by mouth 3 (three) times daily. Must last 30 days 10/14/19 04/11/20  Milinda Pointer, MD  risperiDONE (RISPERDAL) 2 MG tablet Take 1 tablet (2 mg total) by mouth at  bedtime. 12/07/19 02/05/20  Blake Divine, MD  timolol (TIMOPTIC) 0.5 % ophthalmic solution 1 drop 2 (two) times daily. Apply one drop to eye(s) 03/12/19   [provider]  traZODone (DESYREL) 50 MG tablet TAKE 1 TABLET BY MOUTH AT BEDTIME 08/28/19   Karamalegos, Alexander J, DO  umeclidinium-vilanterol (ANORO ELLIPTA) 62.5-25 MCG/INH AEPB Inhale 1 puff into the lungs daily.    [provider]    Allergies Azithromycin  Family History  Problem Relation Age of Onset  . Depression Mother   . Heart disease Father     Social History Social History   Tobacco Use  . Smoking status: Former Smoker    Years: 15.00  . Smokeless tobacco: Current User    Types: Chew  . Tobacco comment: stopped 15 years ago  Vaping Use  . Vaping Use: Never used  Substance Use Topics  . Alcohol use: Yes    Comment: past  . Drug use: Never    Review of Systems  Constitutional: No fever/chills Eyes: No visual changes. ENT: No sore throat.  Positive for facial pain. Cardiovascular: Denies chest pain. Respiratory: Denies shortness of breath.  Gastrointestinal: No abdominal pain.  No nausea, no vomiting.  No diarrhea.  No constipation. Genitourinary: Negative for dysuria. Musculoskeletal: Negative for back pain. Skin: Negative for rash. Neurological: Negative for headaches, focal weakness or numbness.  ____________________________________________   PHYSICAL EXAM:  VITAL SIGNS: ED Triage Vitals [12/13/19 1911]  Enc Vitals Group     BP (!) 104/59     Pulse Rate 94     Resp 20     Temp 97.6 F (36.4 C)     Temp Source Oral     SpO2 97 %     Weight 250 lb (113.4 kg)     Height 5\' 11"  (1.803 m)     Head Circumference      Peak Flow      Pain Score      Pain Loc      Pain Edu?      Excl. in [place]?     Constitutional: Alert and oriented. Eyes: Conjunctivae are normal. Head: Atraumatic. Nose: No congestion/rhinnorhea. Mouth/Throat: Mucous membranes are moist. Neck: Normal  ROM, no midline cervical spine tenderness. Cardiovascular: Normal rate, regular rhythm. Grossly normal heart sounds. Respiratory: Normal respiratory effort.  No retractions. Lungs CTAB. Gastrointestinal: Soft and nontender. No distention. Genitourinary: deferred Musculoskeletal: No lower extremity tenderness nor edema. Neurologic:  Normal speech and language. No gross focal neurologic deficits are appreciated. Skin:  Skin is warm, dry and intact. No rash noted. Psychiatric: Mood and affect are normal. Speech and behavior are normal.  ____________________________________________   LABS (all labs ordered are listed, but only abnormal results are displayed)  Labs Reviewed  CBC WITH DIFFERENTIAL/PLATELET - Abnormal; Notable for the following components:      Result Value   Monocytes Absolute 1.4 (*)    Eosinophils Absolute 0.8 (*)    All other components within normal limits  COMPREHENSIVE METABOLIC PANEL - Abnormal; Notable for the following components:   Glucose, Bld 141 (*)    BUN 40 (*)    Creatinine, Ser 2.43 (*)    GFR calc non Af Amer 24 (*)    GFR calc Af Amer 28 (*)    All other components within normal limits  LACTIC ACID, PLASMA  LACTIC ACID, PLASMA  URINALYSIS, COMPLETE (UACMP) WITH MICROSCOPIC   ____________________________________________  EKG  ED ECG REPORT I, Blake Divine, the attending physician, personally viewed and interpreted this ECG.   Date: 12/13/2019  EKG Time: 19:01  Rate: 88  Rhythm: normal sinus rhythm  Axis: Normal  Intervals:none  ST&T Change: None   PROCEDURES  Procedure(s) performed (including Critical Care):  Procedures   ____________________________________________   INITIAL IMPRESSION / ASSESSMENT AND PLAN / ED COURSE       85 year old male with past medical history of depression, anxiety, COPD, hypertension, and hyperlipidemia presents to the ED following unwitnessed fall at home where he did hit his head and lose  consciousness.  He now primarily complains of facial pain as well as pain along his left lateral chest wall.  EKG shows no evidence of arrhythmia or ischemia and chest x-ray negative for acute process.  CT scans of head, maxillofacial, and C-spine are all negative for acute process.  Lab work does show significant AKI that has developed over the past week.  Speaking with his daughter, patient has had significant difficulty caring for himself at home and she is interested in long-term placement.  His AKI is likely due to dehydration and we will hydrate with IV fluids.  Case was discussed with  hospitalist for admission.      ____________________________________________   FINAL CLINICAL IMPRESSION(S) / ED DIAGNOSES  Final diagnoses:  Fall     ED Discharge Orders    None       Note:  This document was prepared using Dragon voice recognition software and may include unintentional dictation errors.   Blake Divine, MD 12/13/19 [phone]

## 2019-12-14 DIAGNOSIS — I959 Hypotension, unspecified: Secondary | ICD-10-CM | POA: Diagnosis present

## 2019-12-14 DIAGNOSIS — J449 Chronic obstructive pulmonary disease, unspecified: Secondary | ICD-10-CM | POA: Diagnosis present

## 2019-12-14 DIAGNOSIS — Z79891 Long term (current) use of opiate analgesic: Secondary | ICD-10-CM | POA: Diagnosis not present

## 2019-12-14 DIAGNOSIS — W01190A Fall on same level from slipping, tripping and stumbling with subsequent striking against furniture, initial encounter: Secondary | ICD-10-CM | POA: Diagnosis present

## 2019-12-14 DIAGNOSIS — N179 Acute kidney failure, unspecified: Secondary | ICD-10-CM | POA: Diagnosis present

## 2019-12-14 DIAGNOSIS — M810 Age-related osteoporosis without current pathological fracture: Secondary | ICD-10-CM | POA: Diagnosis present

## 2019-12-14 DIAGNOSIS — Z20822 Contact with and (suspected) exposure to covid-19: Secondary | ICD-10-CM | POA: Diagnosis present

## 2019-12-14 DIAGNOSIS — M25562 Pain in left knee: Secondary | ICD-10-CM | POA: Diagnosis present

## 2019-12-14 DIAGNOSIS — Y92009 Unspecified place in unspecified non-institutional (private) residence as the place of occurrence of the external cause: Secondary | ICD-10-CM | POA: Diagnosis not present

## 2019-12-14 DIAGNOSIS — K219 Gastro-esophageal reflux disease without esophagitis: Secondary | ICD-10-CM | POA: Diagnosis present

## 2019-12-14 DIAGNOSIS — Z915 Personal history of self-harm: Secondary | ICD-10-CM | POA: Diagnosis not present

## 2019-12-14 DIAGNOSIS — H547 Unspecified visual loss: Secondary | ICD-10-CM | POA: Diagnosis present

## 2019-12-14 DIAGNOSIS — Z9049 Acquired absence of other specified parts of digestive tract: Secondary | ICD-10-CM | POA: Diagnosis not present

## 2019-12-14 DIAGNOSIS — Z85828 Personal history of other malignant neoplasm of skin: Secondary | ICD-10-CM | POA: Diagnosis not present

## 2019-12-14 DIAGNOSIS — M25561 Pain in right knee: Secondary | ICD-10-CM | POA: Diagnosis present

## 2019-12-14 DIAGNOSIS — R630 Anorexia: Secondary | ICD-10-CM | POA: Diagnosis present

## 2019-12-14 DIAGNOSIS — Z79899 Other long term (current) drug therapy: Secondary | ICD-10-CM | POA: Diagnosis not present

## 2019-12-14 DIAGNOSIS — R296 Repeated falls: Secondary | ICD-10-CM | POA: Diagnosis present

## 2019-12-14 DIAGNOSIS — G894 Chronic pain syndrome: Secondary | ICD-10-CM | POA: Diagnosis present

## 2019-12-14 DIAGNOSIS — H409 Unspecified glaucoma: Secondary | ICD-10-CM | POA: Diagnosis present

## 2019-12-14 DIAGNOSIS — F418 Other specified anxiety disorders: Secondary | ICD-10-CM | POA: Diagnosis present

## 2019-12-14 DIAGNOSIS — F17220 Nicotine dependence, chewing tobacco, uncomplicated: Secondary | ICD-10-CM | POA: Diagnosis present

## 2019-12-14 DIAGNOSIS — R911 Solitary pulmonary nodule: Secondary | ICD-10-CM | POA: Diagnosis present

## 2019-12-14 DIAGNOSIS — E860 Dehydration: Secondary | ICD-10-CM | POA: Diagnosis present

## 2019-12-14 DIAGNOSIS — I10 Essential (primary) hypertension: Secondary | ICD-10-CM | POA: Diagnosis present

## 2019-12-14 DIAGNOSIS — E785 Hyperlipidemia, unspecified: Secondary | ICD-10-CM | POA: Diagnosis present

## 2019-12-14 LAB — CBC
HCT: 37.5 % — ABNORMAL LOW (ref 39.0–52.0)
Hemoglobin: 12.2 g/dL — ABNORMAL LOW (ref 13.0–17.0)
MCH: 28.7 pg (ref 26.0–34.0)
MCHC: 32.5 g/dL (ref 30.0–36.0)
MCV: 88.2 fL (ref 80.0–100.0)
Platelets: 155 10*3/uL (ref 150–400)
RBC: 4.25 MIL/uL (ref 4.22–5.81)
RDW: 14.9 % (ref 11.5–15.5)
WBC: 4 10*3/uL (ref 4.0–10.5)
nRBC: 0 % (ref 0.0–0.2)

## 2019-12-14 LAB — URINE DRUG SCREEN, QUALITATIVE (ARMC ONLY)
Amphetamines, Ur Screen: NOT DETECTED
Barbiturates, Ur Screen: NOT DETECTED
Benzodiazepine, Ur Scrn: POSITIVE — AB
Cannabinoid 50 Ng, Ur ~~LOC~~: NOT DETECTED
Cocaine Metabolite,Ur ~~LOC~~: NOT DETECTED
MDMA (Ecstasy)Ur Screen: POSITIVE — AB
Methadone Scn, Ur: NOT DETECTED
Opiate, Ur Screen: POSITIVE — AB
Phencyclidine (PCP) Ur S: NOT DETECTED
Tricyclic, Ur Screen: POSITIVE — AB

## 2019-12-14 LAB — BASIC METABOLIC PANEL
Anion gap: 10 (ref 5–15)
BUN: 38 mg/dL — ABNORMAL HIGH (ref 8–23)
CO2: 24 mmol/L (ref 22–32)
Calcium: 8.9 mg/dL (ref 8.9–10.3)
Chloride: 109 mmol/L (ref 98–111)
Creatinine, Ser: 1.84 mg/dL — ABNORMAL HIGH (ref 0.61–1.24)
GFR calc Af Amer: 39 mL/min — ABNORMAL LOW (ref >60)
GFR calc non Af Amer: 33 mL/min — ABNORMAL LOW (ref >60)
Glucose, Bld: 99 mg/dL (ref 70–99)
Potassium: 3.4 mmol/L — ABNORMAL LOW (ref 3.5–5.1)
Sodium: 143 mmol/L (ref 135–145)

## 2019-12-14 LAB — URINALYSIS, COMPLETE (UACMP) WITH MICROSCOPIC
Bacteria, UA: NONE SEEN
Glucose, UA: NEGATIVE mg/dL
Hgb urine dipstick: NEGATIVE
Ketones, ur: 5 mg/dL — AB
Nitrite: NEGATIVE
Protein, ur: 100 mg/dL — AB
Specific Gravity, Urine: 1.026 (ref 1.005–1.030)
pH: 5 (ref 5.0–8.0)

## 2019-12-14 LAB — ETHANOL: Alcohol, Ethyl (B): <10 mg/dL (ref <10)

## 2019-12-14 LAB — HEMOGLOBIN A1C
Hgb A1c MFr Bld: 5.4 % (ref 4.8–5.6)
Mean Plasma Glucose: 108.28 mg/dL

## 2019-12-14 LAB — SODIUM, URINE, RANDOM: Sodium, Ur: <10 mmol/L

## 2019-12-14 LAB — SARS CORONAVIRUS 2 (TAT 6-24 HRS): SARS Coronavirus 2: NEGATIVE

## 2019-12-14 LAB — CREATININE, URINE, RANDOM: Creatinine, Urine: 631 mg/dL

## 2019-12-14 MED ORDER — SODIUM CHLORIDE 0.9 % IV SOLN: INTRAVENOUS | Status: DC

## 2019-12-14 MED ORDER — PREGABALIN 50 MG PO CAPS
150.0000 mg | ORAL_CAPSULE | Freq: Three times a day (TID) | ORAL | Status: DC
  Administered 2019-12-14 – 2019-12-16 (×7): 150 mg via ORAL
  Filled 2019-12-14 (×2): qty 3
  Filled 2019-12-14 (×2): qty 2
  Filled 2019-12-14: qty 3
  Filled 2019-12-14 (×3): qty 2

## 2019-12-14 MED ORDER — POTASSIUM CHLORIDE CRYS ER 20 MEQ PO TBCR
40.0000 meq | EXTENDED_RELEASE_TABLET | Freq: Two times a day (BID) | ORAL | Status: AC
  Administered 2019-12-14 – 2019-12-15 (×4): 40 meq via ORAL
  Filled 2019-12-14 (×4): qty 2

## 2019-12-14 NOTE — TOC Initial Note (Signed)
Transition of Care [facility]) - Initial/Assessment Note    Patient Details  Name: Aaron Gibbs MRN: [id number] Date of Birth: [birth year]  [facility] CM/SW Contact:    Boris Sharper, LCSW Phone Number: 12/14/2019, 4:53 PM  Clinical Narrative:                 CSW contacted pt's daughter to discuss discharge recommendations. CSW notified pt's daughter [name] of PT and OT recommendations, [name] feels the pt needs SNF placement upon discharge. Pt currently has an aide daily 9am-3pm but [name] stated that doesn't seem to be enough and he need 24 hour care. on CSW advised [name] of the process and she was agreeable to bed search being completed. CSW completed FL2 and PASRR and faxed out to surrounding facilities. CSW notified [name] that [facility] will follow up with her on bed offers.   TOC will continue to follow    Expected Discharge Plan: Skilled Nursing Facility Barriers to Discharge: Continued Medical Work up   Patient Goals and CMS Choice Patient states their goals for this hospitalization and ongoing recovery are:: to go home  with aide      Expected Discharge Plan and Services Expected Discharge Plan: [place]                                              Prior Living Arrangements/Services   Lives with:: Self Patient language and need for interpreter reviewed:: Yes Do you feel safe going back to the place where you live?: Yes      Need for Family Participation in Patient Care: Yes (Comment) Care giver support system in place?: Yes (comment) (daughters)      Activities of Daily Living Home Assistive Devices/Equipment: Environmental consultant (specify type) ADL Screening (condition at time of admission) Patient's cognitive ability adequate to safely complete daily activities?: Yes Is the patient deaf or have difficulty hearing?: No Does the patient have difficulty seeing, even when wearing glasses/contacts?: Yes Does the patient have difficulty concentrating,  remembering, or making decisions?: No Patient able to express need for assistance with ADLs?: Yes Does the patient have difficulty dressing or bathing?: Yes Independently performs ADLs?: Yes (appropriate for developmental age) Does the patient have difficulty walking or climbing stairs?: Yes Weakness of Legs: Both Weakness of Arms/Hands: Both  Permission Sought/Granted Permission sought to share information with : Facility Sport and exercise psychologist    Share Information with NAME: Osborn Coho     Permission granted to share info w Relationship: Daughters  Permission granted to share info w Contact Information: [phone], [phone]  Emotional Assessment Appearance:: Other (Comment Required (unable to assess) Attitude/Demeanor/Rapport: Unable to Assess Affect (typically observed): Unable to Assess Orientation: : Oriented to Self, Oriented to Place   Psych Involvement: No (comment)  Admission diagnosis:  Fall [W19.XXXA] AKI (acute kidney injury) ([place]) [N17.9] Fall, initial encounter [W19.XXXA] Acute kidney failure [facility]) [N17.9] Patient Active Problem List   Diagnosis Date Noted  . Acute kidney failure ([place]) 12/14/2019  . AKI (acute kidney injury) ([place]) 12/13/2019  . Depression with anxiety 12/13/2019  . Aspiration pneumonia ([place]) 09/25/2019  . COPD (chronic obstructive pulmonary disease) ([place])   . HLD (hyperlipidemia)   . Iron deficiency anemia   . Fall at home, initial encounter   . Medial meniscal tear, sequela (Left) 06/10/2019  . Patellar tendinosis (Right) 06/10/2019  . Acute respiratory  failure with hypoxia ([place]) 04/09/2019  . Community acquired pneumonia 03/29/2019  . Lateral meniscal tear, sequela (Left) 03/03/2019  . Palpitation 02/13/2019  . Preop testing 11/14/2018  . Noncompliance with medication treatment due to overuse of medication 10/23/2018  . Neurogenic pain 08/20/2018  . Atherosclerotic peripheral vascular disease ([place]) 07/24/2018  . Tricompartment  osteoarthritis of knee (Left) 07/24/2018  . Osteoarthritis of knee (Bilateral) 07/24/2018  . Osteoarthritis of patellofemoral joint (Right) 07/24/2018  . History of suicide attempt (06/12/18) 07/24/2018  . Long term current use of opiate analgesic 07/10/2018  . Pharmacologic therapy 07/10/2018  . Disorder of skeletal system 07/10/2018  . Problems influencing health status 07/10/2018  . Suicide attempt ([place]) (05/13/18) 06/26/2018  . Acute metabolic encephalopathy 36/64/4034  . 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 Major depressive disorder, recurrent episode, severe ([place]) 06/04/2018  . Blindness 05/10/2018  . Suicidal ideation 05/10/2018  . Chronic pain syndrome 05/01/2018  . DISH (diffuse idiopathic skeletal hyperostosis) 05/01/2018  . Osteoarthritis of multiple joints 05/01/2018  . Chronic knee pain (Primary Area of Pain) (Bilateral) (L>R) 05/01/2018  . Retinitis pigmentosa of both eyes 05/01/2018  . Glaucoma of both eyes 05/01/2018  . Essential hypertension 05/01/2018  . Chronic low back pain (Bilateral) w/o sciatica 05/01/2018  . GERD (gastroesophageal reflux disease) 05/01/2018  . 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 Therapeutic opioid-induced constipation (OIC) 05/01/2018  . Centrilobular emphysema ([place]) 05/01/2018   PCP:  Olin Hauser, DO Pharmacy:   [facility], [address] [place] [address] Phone: [phone] Fax: [phone]     Social Determinants of Health (SDOH) Interventions    Readmission Risk Interventions No flowsheet data found.

## 2019-12-14 NOTE — Progress Notes (Signed)
PROGRESS NOTE    Aaron Gibbs  LRJ:[id number] DOB: 12-16-1934 DOA: 12/13/2019 PCP: Olin Hauser, DO    Brief Narrative:  85 year old male with history of COPD, hypertension, hyperlipidemia, anxiety depression with history of suicidal ideation, chronic pain syndrome on oxycodone and Lyrica presented to the emergency room after a fall.  Apparently, patient lives by himself with about 5 hours of care at home.  Recently he has been having trouble with a fall and his medications.  He also had some suicidal ideation and was recently at [facility] ER for 4 days waiting for psychiatric bed and later cleared and discharged to home.  Patient reported being weak.  Patient's daughter reported he is progressively getting weaker and weaker. In the emergency room, blood pressure 104/59.  Afebrile.  On room air.  BUN/creatinine, 40/2.43 (known normal creatinine 1.07.)  COVID-19 negative.  Chest x-ray normal with all findings.  Skeletal survey negative.   Assessment & Plan:   Principal Problem:   AKI (acute kidney injury) ([place]) Active Problems:   Essential hypertension   COPD (chronic obstructive pulmonary disease) ([place])   Fall at home, initial encounter   Depression with anxiety  Acute kidney injury: Multifactorial.  Suspect prerenal.  On antihypertensives, hypotensive episodes and poor appetite and poor access to fluid. Holding all antihypertensives.  Continue maintenance IV fluids.  Urine output is adequate.  Gradually improving.  Recheck levels tomorrow morning.  Advanced physical deconditioning with multiple medical problems and recent hospitalizations: Patient with advanced physical debility.  Work with PT OT.  Not safe to stay alone at home.  Does not have 24 hours supervision. He will benefit with inpatient therapies at the skilled nursing facility.  We will send referral. I discussed with patient's daughter, he may ultimately need 24 hours care or assisted living level of  care.  Multiple fall: Multifactorial.  Multifactorial sensory deficit as well as polypharmacy.  Skeletal survey negative.  Check orthostatic.  PT OT.  Fall precautions all time.  Depression/anxiety: Recent ER visit for suicidal ideation.  Denies current suicidal ideation.  Psychiatric recommended following medications that he is getting Cymbalta 60 mg daily Trazodone 100 mg at night Klonopin 0.5 mg twice daily as needed for anxiety Risperdal 0.5 mg twice daily  Hypertension: Blood pressures low normal.  Holding all antihypertensives.  COPD: Blood pressure stable.  Chronic pain syndrome: Probably contributing to fall and polypharmacy.  Patient is chronically on oxycodone Lyrica at that we will resume.  Left upper lobe pulmonary nodule: Found on recent CAT scan.  If patient adequately improves, he will benefit with outpatient PET scan.   DVT prophylaxis: heparin injection 5,000 Units Start: 12/13/19 2300   Code Status: Full code Family Communication: Patient's daughter, Ms. Ruperto Kiernan on phone. Disposition Plan: Status is: Observation  The patient will require care spanning > 2 midnights and should be moved to inpatient because: Unsafe d/c plan  Dispo: The patient is from: Home              Anticipated d/c is to: SNF              Anticipated d/c date is: 2 days              Patient currently is not medically stable to d/c.  Patient presented with significant renal failure and fall, unable to ambulate.  He lives alone at home.  He still needs IV fluids and monitoring in the hospital.  He will need rehab placement.  He is not  safe for discharge home by himself.       Consultants:   None  Procedures:   None  Antimicrobials:   None   Subjective: Patient seen and examined.  No overnight events.  Nursing reported him being impulsive and trying to get out of bed without notifying.  Patient himself complains of bilateral knee pain otherwise no other  complaints.  Objective: Vitals:   12/14/19 0850 12/14/19 0857 12/14/19 0901 12/14/19 1205  BP: (!) 93/49 93/69 (!) 85/74 (!) 98/44  Pulse: 80 91 89 72  Resp:    15  Temp:    98 F (36.7 C)  TempSrc:    Oral  SpO2:    96%  Weight:      Height:        Intake/Output Summary (Last 24 hours) at 12/14/2019 1314 Last data filed at 12/14/2019 0840 Gross per 24 hour  Intake 725.98 ml  Output 325 ml  Net 400.98 ml   Filed Weights   12/13/19 1911  Weight: 113.4 kg    Examination:  General exam: Appears calm, chronically sick looking.  Not in distress. Respiratory system: Clear to auscultation. Respiratory effort normal.  On room air. Cardiovascular system: S1 & S2 heard, RRR. Gastrointestinal system: Abdomen is nondistended, soft and nontender.  Central nervous system: Alert and oriented.  Generalized weakness. Psychiatry: Judgement and insight appear normal. Mood & affect flat.    Data Reviewed: I have personally reviewed following labs and imaging studies  CBC: Recent Labs  Lab 12/13/19 1929 12/14/19 0554  WBC 6.0 4.0  NEUTROABS 2.7  --   HGB 14.4 12.2*  HCT 43.6 37.5*  MCV 88.1 88.2  PLT 186 010   Basic Metabolic Panel: Recent Labs  Lab 12/13/19 1929 12/14/19 0554  NA 144 143  K 3.6 3.4*  CL 107 109  CO2 26 24  GLUCOSE 141* 99  BUN 40* 38*  CREATININE 2.43* 1.84*  CALCIUM 9.9 8.9   GFR: Estimated Creatinine Clearance: 39.6 mL/min (A) (by C-G formula based on SCr of 1.84 mg/dL (H)). Liver Function Tests: Recent Labs  Lab 12/13/19 1929  AST 38  ALT 21  ALKPHOS 55  BILITOT 1.0  PROT 7.6  ALBUMIN 4.4   No results for input(s): LIPASE, AMYLASE in the last 168 hours. No results for input(s): AMMONIA in the last 168 hours. Coagulation Profile: No results for input(s): INR, PROTIME in the last 168 hours. Cardiac Enzymes: No results for input(s): CKTOTAL, CKMB, CKMBINDEX, TROPONINI in the last 168 hours. BNP (last 3 results) No results for input(s):  PROBNP in the last 8760 hours. HbA1C: Recent Labs    12/13/19 1929  HGBA1C 5.4   CBG: No results for input(s): GLUCAP in the last 168 hours. Lipid Profile: No results for input(s): CHOL, HDL, LDLCALC, TRIG, CHOLHDL, LDLDIRECT in the last 72 hours. Thyroid Function Tests: No results for input(s): TSH, T4TOTAL, FREET4, T3FREE, THYROIDAB in the last 72 hours. Anemia Panel: No results for input(s): VITAMINB12, FOLATE, FERRITIN, TIBC, IRON, RETICCTPCT in the last 72 hours. Sepsis Labs: Recent Labs  Lab 12/13/19 1929  LATICACIDVEN 1.1    Recent Results (from the past 240 hour(s))  Urine culture     Status: Abnormal   Collection Time: 12/06/19 10:55 PM   Specimen: Urine, Random  Result Value Ref Range Status   Specimen Description   Final    URINE, RANDOM Performed at [facility], [address] [place], [place] [address]    Special Requests  Final    NONE Performed at [facility], [address] [place], [place] [address]    Culture >=100,000 COLONIES/mL ENTEROCOCCUS FAECALIS (A)  Final   Report Status 12/09/2019 FINAL  Final   Organism ID, Bacteria ENTEROCOCCUS FAECALIS (A)  Final      Susceptibility   Enterococcus faecalis - MIC*    AMPICILLIN <=2 SENSITIVE Sensitive     NITROFURANTOIN <=16 SENSITIVE Sensitive     VANCOMYCIN 1 SENSITIVE Sensitive     * >=100,000 COLONIES/mL ENTEROCOCCUS FAECALIS         Radiology Studies: DG Chest 1 View  Result Date: 12/13/2019 CLINICAL DATA:  Un witnessed fall, hypotension EXAM: CHEST  1 VIEW COMPARISON:  09/25/2019 FINDINGS: Single frontal view of the chest demonstrates an unremarkable cardiac silhouette. The left upper lobe nodule seen on recent chest CT is again identified overlying the left anterior fourth rib, stable. No airspace disease, effusion, or pneumothorax. No acute displaced fracture. IMPRESSION: 1. Stable left upper lobe pulmonary nodule. PET CT again recommended if not performed in the  interim. 2. Otherwise no acute intrathoracic process.  Cough Electronically Signed   By: Randa Ngo M.D.   On: 12/13/2019 20:01   CT Head Wo Contrast  Result Date: 12/13/2019 CLINICAL DATA:  Fall EXAM: CT HEAD WITHOUT CONTRAST TECHNIQUE: Contiguous axial images were obtained from the base of the skull through the vertex without intravenous contrast. COMPARISON:  12/07/2019 FINDINGS: Brain: There is no acute intracranial hemorrhage, mass effect, or edema. No new loss of gray-white differentiation. There is no extra-axial fluid collection. Ventricles and sulci are stable in size and configuration. Patchy hypoattenuation in the supratentorial white matter likely reflects stable chronic microvascular ischemic changes. Vascular: No hyperdense vessel or unexpected calcification. Skull: Calvarium is unremarkable. Sinuses/Orbits: No acute finding. Other: None. IMPRESSION: No evidence of acute intracranial injury. Electronically Signed   By: Macy Mis M.D.   On: 12/13/2019 20:37   CT Cervical Spine Wo Contrast  Result Date: 12/13/2019 CLINICAL DATA:  Fall EXAM: CT CERVICAL SPINE WITHOUT CONTRAST TECHNIQUE: Multidetector CT imaging of the cervical spine was performed without intravenous contrast. Multiplanar CT image reconstructions were also generated. COMPARISON:  None. FINDINGS: Alignment: Anteroposterior alignment is maintained. Skull base and vertebrae: Decreased osseous mineralization. Diffuse bridging osteophytes/syndesmophytes. No acute cervical spine fracture. Indeterminate lucent lesion within the right C2 lateral mass. Soft tissues and spinal canal: No prevertebral fluid or swelling. No visible canal hematoma. Disc levels: Multilevel degenerative changes are present including disc space narrowing, endplate osteophytes, and facet and uncovertebral hypertrophy. Upper chest: No apical lung mass. Other: None. IMPRESSION: No acute cervical spine fracture. Indeterminate lucent lesion of C2.  Electronically Signed   By: Macy Mis M.D.   On: 12/13/2019 20:51   CT Maxillofacial Wo Contrast  Result Date: 12/13/2019 CLINICAL DATA:  Fall EXAM: CT MAXILLOFACIAL WITHOUT CONTRAST TECHNIQUE: Multidetector CT imaging of the maxillofacial structures was performed. Multiplanar CT image reconstructions were also generated. COMPARISON:  None. FINDINGS: Osseous: No acute facial fracture. Orbits: No intraorbital hematoma. Sinuses: Minor mucosal thickening. Soft tissues: There is a 11 mm soft tissue nodule of the superficial right parotid. Limited intracranial: Dictated separately. IMPRESSION: No acute facial fracture. Indeterminate 11 mm soft tissue nodule of the right parotid. This may reflect a mildly enlarged lymph node or a primary parotid neoplasm. Electronically Signed   By: Macy Mis M.D.   On: 12/13/2019 20:55        Scheduled Meds: . DULoxetine  60 mg Oral  Daily  . heparin  5,000 Units Subcutaneous Q8H  . potassium chloride  40 mEq Oral BID  . pregabalin  150 mg Oral TID  . risperiDONE  0.5 mg Oral BID  . traZODone  100 mg Oral QHS  . umeclidinium-vilanterol  1 puff Inhalation Daily   Continuous Infusions: . sodium chloride       LOS: 0 days    Time spent: 25 minutes    Barb Merino, MD [facility] Pager [phone]

## 2019-12-14 NOTE — NC FL2 (Signed)
[place] LEVEL OF CARE SCREENING TOOL     IDENTIFICATION  Patient Name: Aaron Gibbs Birthdate: 1935/06/13 Sex: male Admission Date (Current Location): 12/13/2019  [facility] and [place] Number:  Engineering geologist and Address:         Provider Number:    Attending Physician Name and Address:  Barb Merino, MD  Relative Name and Phone Number:  [name] [phone]    Current Level of Care: SNF Recommended Level of Care: [place] Prior Approval Number:    Date Approved/Denied:   PASRR Number: [id number]  Discharge Plan: SNF    Current Diagnoses: Patient Active Problem List   Diagnosis Date Noted  . Acute kidney failure ([place]) 12/14/2019  . AKI (acute kidney injury) ([place]) 12/13/2019  . Depression with anxiety 12/13/2019  . Aspiration pneumonia ([place]) 09/25/2019  . COPD (chronic obstructive pulmonary disease) ([place])   . HLD (hyperlipidemia)   . Iron deficiency anemia   . Fall at home, initial encounter   . Medial meniscal tear, sequela (Left) 06/10/2019  . Patellar tendinosis (Right) 06/10/2019  . Acute respiratory failure with hypoxia ([place]) 04/09/2019  . Community acquired pneumonia 03/29/2019  . Lateral meniscal tear, sequela (Left) 03/03/2019  . Palpitation 02/13/2019  . Preop testing 11/14/2018  . Noncompliance with medication treatment due to overuse of medication 10/23/2018  . Neurogenic pain 08/20/2018  . Atherosclerotic peripheral vascular disease ([place]) 07/24/2018  . Tricompartment osteoarthritis of knee (Left) 07/24/2018  . Osteoarthritis of knee (Bilateral) 07/24/2018  . Osteoarthritis of patellofemoral joint (Right) 07/24/2018  . History of suicide attempt (06/12/18) 07/24/2018  . Long term current use of opiate analgesic 07/10/2018  . Pharmacologic therapy 07/10/2018  . Disorder of skeletal system 07/10/2018  . Problems influencing health status 07/10/2018  . Suicide attempt ([place]) (05/13/18) 06/26/2018  . Acute  metabolic encephalopathy 12/45/8099  . 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 Major depressive disorder, recurrent episode, severe ([place]) 06/04/2018  . Blindness 05/10/2018  . Suicidal ideation 05/10/2018  . Chronic pain syndrome 05/01/2018  . DISH (diffuse idiopathic skeletal hyperostosis) 05/01/2018  . Osteoarthritis of multiple joints 05/01/2018  . Chronic knee pain (Primary Area of Pain) (Bilateral) (L>R) 05/01/2018  . Retinitis pigmentosa of both eyes 05/01/2018  . Glaucoma of both eyes 05/01/2018  . Essential hypertension 05/01/2018  . Chronic low back pain (Bilateral) w/o sciatica 05/01/2018  . GERD (gastroesophageal reflux disease) 05/01/2018  . 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 Therapeutic opioid-induced constipation (OIC) 05/01/2018  . Centrilobular emphysema ([place]) 05/01/2018    Orientation RESPIRATION BLADDER Height & Weight     Self, Place  Normal Continent Weight: 250 lb (113.4 kg) Height:  5\' 11"  (180.3 cm)  BEHAVIORAL SYMPTOMS/MOOD NEUROLOGICAL BOWEL NUTRITION STATUS     (None) Continent Diet  AMBULATORY STATUS COMMUNICATION OF NEEDS Skin   Limited Assist Verbally Normal                       Personal Care Assistance Level of Assistance  Bathing, Feeding, Dressing Bathing Assistance: Limited assistance Feeding assistance: Limited assistance Dressing Assistance: Limited assistance     Functional Limitations Info  Sight, Hearing, Speech Sight Info: Impaired Hearing Info: Adequate Speech Info: Adequate    SPECIAL CARE FACTORS FREQUENCY  PT (By licensed PT), OT (By licensed OT)     PT Frequency: 5x week OT Frequency: 5x week            Contractures Contractures Info: Not present  Additional Factors Info  Code Status Code Status Info: Full             Current Medications (12/14/2019):  This is the current hospital active medication list Current Facility-Administered Medications  Medication Dose Route Frequency  Provider Last Rate Last Admin  . 0.9 %  sodium chloride infusion   Intravenous Continuous Barb Merino, MD 125 mL/hr at 12/14/19 1414 Restarted at 12/14/19 1414  . acetaminophen (TYLENOL) tablet 650 mg  650 mg Oral Q6H PRN Lenore Cordia, MD       Or  . acetaminophen (TYLENOL) suppository 650 mg  650 mg Rectal Q6H PRN Zada Finders R, MD      . albuterol (PROVENTIL) (2.5 MG/3ML) 0.083% nebulizer solution 3 mL  3 mL Inhalation Q6H PRN Zada Finders R, MD      . clonazePAM (KLONOPIN) tablet 0.5 mg  0.5 mg Oral BID PRN Lenore Cordia, MD   0.5 mg at 12/14/19 0824  . DULoxetine (CYMBALTA) DR capsule 60 mg  60 mg Oral Daily Zada Finders R, MD   60 mg at 12/14/19 0817  . heparin injection 5,000 Units  5,000 Units Subcutaneous Q8H Lenore Cordia, MD   5,000 Units at 12/14/19 1345  . ondansetron (ZOFRAN) tablet 4 mg  4 mg Oral Q6H PRN Lenore Cordia, MD       Or  . ondansetron (ZOFRAN) injection 4 mg  4 mg Intravenous Q6H PRN Zada Finders R, MD      . oxyCODONE (Oxy IR/ROXICODONE) immediate release tablet 10 mg  10 mg Oral TID PRN Lenore Cordia, MD   10 mg at 12/14/19 0817  . potassium chloride SA (KLOR-CON) CR tablet 40 mEq  40 mEq Oral BID Barb Merino, MD   40 mEq at 12/14/19 1345  . pregabalin (LYRICA) capsule 150 mg  150 mg Oral TID Barb Merino, MD   150 mg at 12/14/19 1140  . risperiDONE (RISPERDAL) tablet 0.5 mg  0.5 mg Oral BID Zada Finders R, MD   0.5 mg at 12/14/19 6015  . senna-docusate (Senokot-S) tablet 1 tablet  1 tablet Oral QHS PRN Lenore Cordia, MD      . traZODone (DESYREL) tablet 100 mg  100 mg Oral QHS Zada Finders R, MD   100 mg at 12/14/19 0020  . umeclidinium-vilanterol (ANORO ELLIPTA) 62.5-25 MCG/INH 1 puff  1 puff Inhalation Daily Lenore Cordia, MD   1 puff at 12/14/19 6153     Discharge Medications: Please see discharge summary for a list of discharge medications.  Relevant Imaging Results:  Relevant Lab Results:   Additional Information [id number]  [address] [place], LCSW

## 2019-12-14 NOTE — Progress Notes (Signed)
Writer called to patient's room by NT patient is noncomplaint and continues to try to get OOB without assistance and restless pulling at lines. Patient pulled out IV. Writer provided prn medications as ordered and messaged MD. MD requested patient to have a sitter. Sitter now at patient's bedside. Will continue to monitor.

## 2019-12-14 NOTE — Progress Notes (Deleted)
No show

## 2019-12-14 NOTE — Evaluation (Signed)
Physical Therapy Evaluation Patient Details Name: Aaron Gibbs MRN: [id number] DOB: 30-Oct-1934 Today's Date: 12/14/2019   History of Present Illness  Aaron Gibbs is a 85 y.o. male with medical history significant for COPD, hypertension, hyperlipidemia, anxiety/depression with history of suicidal ideation, and chronic pain syndrome who presents to the ED for evaluation after a fall.  Clinical Impression  Patient is an 85 y/o M that presents after a fall at home. He is legally blind, and has a caregiver at home for 5 hours a day, 5 days a week. He uses a RW at baseline, and has a history of falls and progressive weakness. He is quite hesitant to perform any mobility past sit to stand this date, declines any further attempts at mobility this date once returned to sitting. He does not appear to be capable of returning to his previous establishment and would likely benefit from SNF placement at this time to improve his functional mobility.     Follow Up Recommendations SNF    Equipment Recommendations  Rolling walker with 5" wheels    Recommendations for Other Services       Precautions / Restrictions Precautions Precautions: Fall Restrictions Weight Bearing Restrictions: No      Mobility  Bed Mobility               General bed mobility comments: Did not observe  Transfers Overall transfer level: Needs assistance Equipment used: Rolling walker (2 wheeled) Transfers: Sit to/from Stand Sit to Stand: Min assist;Mod assist         General transfer comment: Patient was able to transfer from low surface with RW with min-mod A with cuing for technique.  Ambulation/Gait             General Gait Details: Patient declined any attempt at gait this date as he states he is "too weak".  Stairs            Wheelchair Mobility    Modified Rankin (Stroke Patients Only)       Balance Overall balance assessment: Needs assistance Sitting-balance support:  Bilateral upper extremity supported;Feet supported Sitting balance-Leahy Scale: Good Sitting balance - Comments: No deficits observed   Standing balance support: Bilateral upper extremity supported Standing balance-Leahy Scale: Poor Standing balance comment: Patient declined any attempt at mobility once in standing.                             Pertinent Vitals/Pain Pain Assessment: No/denies pain    Home Living Family/patient expects to be discharged to:: Private residence Living Arrangements: Alone Available Help at Discharge: Personal care attendant;Available PRN/intermittently (5hrs/day 5 days/week) Type of Home: Apartment Home Access: Stairs to enter Entrance Stairs-Rails: Right;Left;Can reach both Entrance Stairs-Number of Steps: 2 Home Layout: One level Home Equipment: Walker - 2 wheels;Cane - single point;Transport chair;Hand held shower head;Grab bars - toilet;Shower seat;Grab bars - tub/shower (Environmental education officer )      Prior Function Level of Independence: Needs assistance   Gait / Transfers Assistance Needed: Pt uses RW for mobility endorses 3 falls last 6 months   ADL's / Homemaking Assistance Needed: Aids assist c meals, IADLs, and supervise mobility  Comments: Pt has low vision     Hand Dominance   Dominant Hand: Right    Extremity/Trunk Assessment   Upper Extremity Assessment Upper Extremity Assessment: Generalized weakness    Lower Extremity Assessment Lower Extremity Assessment: Generalized weakness  Communication   Communication: No difficulties (visual impairment )  Cognition Arousal/Alertness: Awake/alert Behavior During Therapy: WFL for tasks assessed/performed Overall Cognitive Status: No family/caregiver present to determine baseline cognitive functioning                                 General Comments: Patient was able to follow commands but seemed uninterested in session.      General Comments       Exercises     Assessment/Plan    PT Assessment Patient needs continued PT services  PT Problem List Decreased strength;Decreased mobility;Decreased safety awareness;Decreased range of motion;Decreased knowledge of precautions;Decreased activity tolerance;Decreased cognition;Cardiopulmonary status limiting activity;Decreased balance;Decreased knowledge of use of DME       PT Treatment Interventions      PT Goals (Current goals can be found in the Care Plan section)  Acute Rehab PT Goals Patient Stated Goal: To return to his dog  PT Goal Formulation: With patient Time For Goal Achievement: 12/28/19 Potential to Achieve Goals: Fair    Frequency Min 2X/week   Barriers to discharge Decreased caregiver support Patient lives alone at home and is not currently capable of self management    Co-evaluation               AM-PAC PT "6 Clicks" Mobility  Outcome Measure Help needed turning from your back to your side while in a flat bed without using bedrails?: A Little Help needed moving from lying on your back to sitting on the side of a flat bed without using bedrails?: A Little Help needed moving to and from a bed to a chair (including a wheelchair)?: A Lot Help needed standing up from a chair using your arms (e.g., wheelchair or bedside chair)?: A Lot Help needed to walk in hospital room?: A Lot Help needed climbing 3-5 steps with a railing? : Total 6 Click Score: 13    End of Session Equipment Utilized During Treatment: Gait belt Activity Tolerance: Patient limited by fatigue Patient left: in bed;with bed alarm set (Let RN know bed alarm on, he was sitting on side of bed and appeared capable. RN ok w/ patient in this position) Nurse Communication: Mobility status PT Visit Diagnosis: Difficulty in walking, not elsewhere classified (R26.2);Muscle weakness (generalized) (M62.81)    Time: 0093-8182 PT Time Calculation (min) (ACUTE ONLY): 14 min   Charges:   PT  Evaluation $PT Eval Moderate Complexity: 1 Mod      Royce Macadamia PT, DPT, CSCS       12/14/2019, 3:08 PM

## 2019-12-14 NOTE — Evaluation (Signed)
Occupational Therapy Evaluation Patient Details Name: Aaron Gibbs MRN: [id number] DOB: February 06, 1935 Today's Date: 12/14/2019    History of Present Illness Aaron Gibbs is a 85 y.o. male with medical history significant for COPD, hypertension, hyperlipidemia, anxiety/depression with history of suicidal ideation, and chronic pain syndrome who presents to the ED for evaluation after a fall.   Clinical Impression   Aaron Gibbs was seen for OT evaluation this date. Prior to hospital admission, pt was MOD I c RW for mobility and required assist from aids for IADLs and meals. Pt lives alone c dog Charleston Ropes and has personal aids 5 hours/day 5 days/week to assist for visual impairment. Pt presents to acute OT demonstrating impaired ADL performance, functional cognition, and functional mobility 2/2 decreased safety awareness, impaired problem solving, functional strength/ROM deficits, and decreased LB access. Pt currently requires SETUP + MIN A self-feeding 2/2 low vision. MAX A for LBD seated EOC. Anticipate MIN A for UBD in sitting. MIN A for ADL t/f - assist for RW mngmt, no physical assist required to stand (anticipate improved mobility in home environment). Pt would benefit from skilled OT to address noted impairments and functional limitations (see below for any additional details) in order to maximize safety and independence while minimizing falls risk and caregiver burden. Upon hospital discharge, recommend HHOT c 24 HR SUPERVISION to maximize pt safety and return to functional independence during meaningful occupations of daily life.     Follow Up Recommendations  Home health OT;Supervision/Assistance - 24 hour    Equipment Recommendations  None recommended by OT    Recommendations for Other Services       Precautions / Restrictions Precautions Precautions: Fall Restrictions Weight Bearing Restrictions: No      Mobility Bed Mobility Overal bed mobility: Needs Assistance Bed  Mobility: Sidelying to Sit;Sit to Sidelying   Sidelying to sit: Mod assist     Sit to sidelying: Mod assist General bed mobility comments: Pt does little to assist physcially - suspect has strength to participate more than appreciated this date  Transfers Overall transfer level: Needs assistance Equipment used: Rolling walker (2 wheeled) Transfers: Sit to/from Stand Sit to Stand: Min guard;From elevated surface         General transfer comment: CGA + RW sit<>stand c VCs for technique     Balance Overall balance assessment: Needs assistance Sitting-balance support: Feet supported;Single extremity supported Sitting balance-Leahy Scale: Good Sitting balance - Comments: rigid posture   Standing balance support: Bilateral upper extremity supported Standing balance-Leahy Scale: Fair                             ADL either performed or assessed with clinical judgement   ADL Overall ADL's : Needs assistance/impaired                                       General ADL Comments: Pt required SETUP + MIN A self-feeding 2/2 low vision. MAX A LBD seated EOC. Anticipate MIN A for UBD in sitting. MIN A for ADL t/f for RW mngmt (anticipate improved mobility in home environment)     Vision Baseline Vision/History: Legally blind       Perception     Praxis      Pertinent Vitals/Pain Pain Assessment: No/denies pain     Hand Dominance Right   Extremity/Trunk Assessment Upper Extremity Assessment Upper Extremity  Assessment: Generalized weakness (BUE shoulder flexion AROM limited ~90*)   Lower Extremity Assessment Lower Extremity Assessment: Generalized weakness       Communication Communication Communication: No difficulties (visual impairment )   Cognition Arousal/Alertness: Awake/alert Behavior During Therapy: WFL for tasks assessed/performed Overall Cognitive Status: No family/caregiver present to determine baseline cognitive functioning                                  General Comments: Difficulty following verbal cues "turn left" "scoot forward" "keep turning the direction you are turning"   General Comments  Orthostatic vitals: Lying: BP 93/49, MAP 64. Sitting: BP 112/69, MAP 82. Standing: 93/69, MAP 77. Standing 3 min: 85/74, MAP 78 - pt reports dizziness in standing after 3 minutes however no postural sway or LOBs noted     Exercises Exercises: Other exercises Other Exercises Other Exercises: Pt educated re: OT role, falls prevention, RW mngmt, safe t/f technique, importance of supervision for mobility  Other Exercises: LBD, self-feeding, sup<>sit, sit<>stand, sitting/standing balance/tolerance, setup for meal    Shoulder Instructions      Home Living Family/patient expects to be discharged to:: Private residence Living Arrangements: Alone Available Help at Discharge: Personal care attendant;Available PRN/intermittently (5hrs/day 5 days/week) Type of Home: Apartment Home Access: Stairs to enter Entrance Stairs-Number of Steps: 2 Entrance Stairs-Rails: Right;Left;Can reach both Home Layout: One level     Bathroom Shower/Tub: Teacher, early years/pre: Standard     Home Equipment: Environmental consultant - 2 wheels;Cane - single point;Transport chair;Hand held shower head;Grab bars - toilet;Shower seat;Grab bars - tub/shower (Environmental education officer )          Prior Functioning/Environment Level of Independence: Needs assistance  Gait / Transfers Assistance Needed: Pt uses RW for mobility endorses 3 falls last 6 months  ADL's / Homemaking Assistance Needed: Aids assist c meals, IADLs, and supervise mobility   Comments: Pt has low vision        OT Problem List: Decreased strength;Decreased range of motion;Decreased activity tolerance;Impaired balance (sitting and/or standing);Decreased cognition;Decreased safety awareness;Decreased knowledge of use of DME or AE      OT Treatment/Interventions: Self-care/ADL  training;Therapeutic exercise;Energy conservation;DME and/or AE instruction;Therapeutic activities;Cognitive remediation/compensation;Visual/perceptual remediation/compensation;Patient/family education;Balance training    OT Goals(Current goals can be found in the care plan section) Acute Rehab OT Goals Patient Stated Goal: To return to his dog  OT Goal Formulation: With patient Time For Goal Achievement: 12/28/19 Potential to Achieve Goals: Good ADL Goals Pt Will Perform Eating: with set-up;sitting Pt Will Perform Lower Body Dressing: with min assist;sit to/from stand (c LRAD PRN) Pt Will Transfer to Toilet: with supervision;ambulating;regular height toilet (c LRAD and VCs PRN)  OT Frequency: Min 1X/week   Barriers to D/C: Inaccessible home environment;Decreased caregiver support          Co-evaluation              AM-PAC OT "6 Clicks" Daily Activity     Outcome Measure Help from another person eating meals?: A Little Help from another person taking care of personal grooming?: A Little Help from another person toileting, which includes using toliet, bedpan, or urinal?: A Lot Help from another person bathing (including washing, rinsing, drying)?: A Lot Help from another person to put on and taking off regular upper body clothing?: A Little Help from another person to put on and taking off regular lower body clothing?: A Lot 6 Click  Score: 15   End of Session Equipment Utilized During Treatment: Surveyor, mining Communication: Mobility status  Activity Tolerance: Patient tolerated treatment well Patient left: in chair;with call bell/phone within reach;with chair alarm set  OT Visit Diagnosis: Unsteadiness on feet (R26.81);Other abnormalities of gait and mobility (R26.89);Repeated falls (R29.6)                Time: 8206-0156 OT Time Calculation (min): 29 min Charges:  OT General Charges $OT Visit: 1 Visit OT Evaluation $OT Eval Moderate Complexity: 1 Mod OT  Treatments $Self Care/Home Management : 23-37 mins  Dessie Coma, M.S. OTR/L  12/14/19, 10:18 AM

## 2019-12-15 ENCOUNTER — Encounter: Payer: Medicare Other | Admitting: Pain Medicine

## 2019-12-15 LAB — BASIC METABOLIC PANEL
Anion gap: 7 (ref 5–15)
BUN: 28 mg/dL — ABNORMAL HIGH (ref 8–23)
CO2: 25 mmol/L (ref 22–32)
Calcium: 8.8 mg/dL — ABNORMAL LOW (ref 8.9–10.3)
Chloride: 112 mmol/L — ABNORMAL HIGH (ref 98–111)
Creatinine, Ser: 1.19 mg/dL (ref 0.61–1.24)
GFR calc Af Amer: >60 mL/min (ref >60)
GFR calc non Af Amer: 57 mL/min — ABNORMAL LOW (ref >60)
Glucose, Bld: 98 mg/dL (ref 70–99)
Potassium: 3.8 mmol/L (ref 3.5–5.1)
Sodium: 144 mmol/L (ref 135–145)

## 2019-12-15 MED ORDER — TIMOLOL MALEATE 0.5 % OP SOLN
1.0000 [drp] | Freq: Two times a day (BID) | OPHTHALMIC | Status: DC
  Administered 2019-12-16: 1 [drp] via OPHTHALMIC
  Filled 2019-12-15: qty 5

## 2019-12-15 MED ORDER — FERROUS SULFATE 325 (65 FE) MG PO TABS
325.0000 mg | ORAL_TABLET | Freq: Every day | ORAL | Status: DC
  Administered 2019-12-16: 325 mg via ORAL
  Filled 2019-12-15: qty 1

## 2019-12-15 MED ORDER — PANTOPRAZOLE SODIUM 40 MG PO TBEC
40.0000 mg | DELAYED_RELEASE_TABLET | Freq: Every day | ORAL | Status: DC
  Administered 2019-12-16: 40 mg via ORAL
  Filled 2019-12-15: qty 1

## 2019-12-15 MED ORDER — FENOFIBRATE 160 MG PO TABS
160.0000 mg | ORAL_TABLET | Freq: Every day | ORAL | Status: DC
  Administered 2019-12-16: 160 mg via ORAL
  Filled 2019-12-15: qty 1

## 2019-12-15 MED ORDER — ASPIRIN EC 81 MG PO TBEC
81.0000 mg | DELAYED_RELEASE_TABLET | Freq: Every day | ORAL | Status: DC
  Administered 2019-12-16: 81 mg via ORAL
  Filled 2019-12-15: qty 1

## 2019-12-15 NOTE — Progress Notes (Signed)
Physical Therapy Treatment Patient Details Name: Aaron Gibbs MRN: [id number] DOB: May 04, 1935 Today's Date: 12/15/2019    History of Present Illness Aaron Gibbs is a 85 y.o. male with medical history significant for COPD, hypertension, hyperlipidemia, anxiety/depression with history of suicidal ideation, and chronic pain syndrome who presents to the ED for evaluation after a fall.    PT Comments    Pt asleep upon entry, difficult to rouse, but he is agreeable to a sleepy and limited session. Pt able to progress to some AMB activity this date, but endorses only being about 40% of his normal capacity. Pt able to alternate FWD and retroAMB with heavy verbal cuing due to visual impairment, 1 LOB requiring assistance. Pt fatigued and SOB after <3 minutes up AMB at bedside. Pt made comfortable in chair at end of session.   Follow Up Recommendations  SNF     Equipment Recommendations  Rolling walker with 5" wheels    Recommendations for Other Services       Precautions / Restrictions Precautions Precautions: Fall Restrictions Weight Bearing Restrictions: No    Mobility  Bed Mobility Overal bed mobility: Needs Assistance Bed Mobility: Supine to Sit     Supine to sit: Min assist        Transfers Overall transfer level: Needs assistance Equipment used: Rolling walker (2 wheeled) Transfers: Sit to/from Stand Sit to Stand: Min guard         General transfer comment: fairly good leg strength, safe use of RW  Ambulation/Gait Ambulation/Gait assistance: Min assist;Min guard Gait Distance (Feet): 35 Feet Assistive device: Rolling walker (2 wheeled) Gait Pattern/deviations: Step-to pattern     General Gait Details: minGuard throughout, alternating AMB, retroAMB. Pt has 1 LOB which requires minA from author to correct   Stairs             Wheelchair Mobility    Modified Rankin (Stroke Patients Only)       Balance                                             Cognition Arousal/Alertness: Lethargic Behavior During Therapy: WFL for tasks assessed/performed Overall Cognitive Status: Within Functional Limits for tasks assessed                                        Exercises Other Exercises Other Exercises: seated LAQ    General Comments        Pertinent Vitals/Pain Pain Assessment: No/denies pain    Home Living                      Prior Function            PT Goals (current goals can now be found in the care plan section) Acute Rehab PT Goals Patient Stated Goal: To return to his dog  PT Goal Formulation: With patient Time For Goal Achievement: 12/28/19 Potential to Achieve Goals: Fair Progress towards PT goals: Progressing toward goals    Frequency    Min 2X/week      PT Plan Current plan remains appropriate    Co-evaluation              AM-PAC PT "6 Clicks" Mobility   Outcome Measure  Help needed turning from your  back to your side while in a flat bed without using bedrails?: A Little Help needed moving from lying on your back to sitting on the side of a flat bed without using bedrails?: A Little Help needed moving to and from a bed to a chair (including a wheelchair)?: A Lot Help needed standing up from a chair using your arms (e.g., wheelchair or bedside chair)?: A Lot Help needed to walk in hospital room?: A Lot Help needed climbing 3-5 steps with a railing? : Total 6 Click Score: 13    End of Session Equipment Utilized During Treatment: Gait belt Activity Tolerance: Patient limited by fatigue;No increased pain Patient left: in chair;with chair alarm set Nurse Communication: Mobility status PT Visit Diagnosis: Difficulty in walking, not elsewhere classified (R26.2);Muscle weakness (generalized) (M62.81)     Time: 9735-3299 PT Time Calculation (min) (ACUTE ONLY): 15 min  Charges:  $Therapeutic Exercise: 8-22 mins                     12:51 PM,  12/15/19 Etta Grandchild, PT, DPT Physical Therapist - [facility]  [phone] ([place])     [place] 12/15/2019, 12:48 PM

## 2019-12-15 NOTE — Progress Notes (Signed)
PROGRESS NOTE    Aaron Gibbs  VEL:[id number] DOB: 02/26/1935 DOA: 12/13/2019 PCP: Olin Hauser, DO    Brief Narrative:  85 year old male with history of COPD, hypertension, hyperlipidemia, anxiety depression with history of suicidal ideation, chronic pain syndrome on oxycodone and Lyrica presented to the emergency room after a fall.  Apparently, patient lives by himself with about 5 hours of care at home.  Recently he has been having trouble with a fall and his medications.  He also had some suicidal ideation and was recently at [facility] ER for 4 days waiting for psychiatric bed and later cleared and discharged to home.  Patient reported being weak.  Patient's daughter reported he is progressively getting weaker and weaker. In the emergency room, blood pressure 104/59.  Afebrile.  On room air.  BUN/creatinine, 40/2.43 (known normal creatinine 1.07.)  COVID-19 negative.  Chest x-ray normal with all findings.  Skeletal survey negative.   Assessment & Plan:   Principal Problem:   AKI (acute kidney injury) ([place]) Active Problems:   Essential hypertension   COPD (chronic obstructive pulmonary disease) ([place])   Fall at home, initial encounter   Depression with anxiety   Acute kidney failure ([place])  Acute kidney injury:  Multifactorial.  Suspect prerenal.  On antihypertensives, hypotensive episodes and poor appetite and poor access to fluid. Holding all antihypertensives.  Clinically improving.  Discontinue maintenance fluid and monitor.  Urine output is adequate.   Advanced physical deconditioning with multiple medical problems and recent hospitalizations: Patient with advanced physical debility.  Blindness.  Work with PT OT.  Not safe to stay alone at home.   He will benefit with inpatient therapies at the skilled nursing facility.   I discussed with patient's daughter, he may ultimately need 24 hours care or assisted living level of care.  Multiple fall: Multifactorial.   Multifactorial sensory deficit as well as polypharmacy.  Skeletal survey negative.  All-time fall precautions.  Rehab.  Depression/anxiety: Recent ER visit for suicidal ideation.  Denies current suicidal ideation.  Psychiatric recommended following medications that he is getting Cymbalta 60 mg daily Trazodone 100 mg at night Klonopin 0.5 mg twice daily as needed for anxiety Risperdal 0.5 mg twice daily  Hypertension: Blood pressures low normal.  Holding all antihypertensives.  COPD: Blood pressure stable.  Chronic pain syndrome: Probably contributing to fall and polypharmacy.  Patient is chronically on oxycodone Lyrica at that we will resume.  Left upper lobe pulmonary nodule: Found on recent CAT scan.  If patient adequately improves, he will benefit with outpatient PET scan.   DVT prophylaxis: heparin injection 5,000 Units Start: 12/13/19 2300   Code Status: Full code Family Communication: Patient's daughter, Ms. Doyl Bitting on phone.  6/27 Disposition Plan: Status is: Inpatient  The patient will require care spanning > 2 midnights and should be moved to inpatient because: Unsafe d/c plan  Dispo: The patient is from: Home              Anticipated d/c is to: SNF              Anticipated d/c date is: When available              Patient currently is medically stable only to transfer to skilled level of care.      Consultants:   None  Procedures:   None  Antimicrobials:   None   Subjective: Patient seen and examined.  No overnight events.  Less agitation and impulsiveness overnight. "I do not  feel well, nothing in particular, I have a hard time walking due to my knees" no other complaints.  Objective: Vitals:   12/14/19 1205 12/14/19 1721 12/14/19 2356 12/15/19 0743  BP: (!) 98/44 97/76 (!) 144/56 (!) 120/49  Pulse: 72 75 77 74  Resp: 15 16 17 17   Temp: 98 F (36.7 C) 97.8 F (36.6 C) 97.7 F (36.5 C) 97.8 F (36.6 C)  TempSrc: Oral Oral Oral Oral  SpO2:  96% 97% 94% 93%  Weight:      Height:        Intake/Output Summary (Last 24 hours) at 12/15/2019 1150 Last data filed at 12/15/2019 1007 Gross per 24 hour  Intake 1816.25 ml  Output 950 ml  Net 866.25 ml   Filed Weights   12/13/19 1911  Weight: 113.4 kg    Examination:  General exam: Appears calm, chronically sick looking.  Not in distress.  Blind in both eyes. Respiratory system: Clear to auscultation. Respiratory effort normal.  On room air. Cardiovascular system: S1 & S2 heard, RRR. Gastrointestinal system: Abdomen is nondistended, soft and nontender.  Central nervous system: Alert and oriented.  Generalized weakness. Psychiatry: Judgement and insight appear normal. Mood & affect flat. Denies any suicidal or homicidal ideation.  Denies any delusions or hallucinations.    Data Reviewed: I have personally reviewed following labs and imaging studies  CBC: Recent Labs  Lab 12/13/19 1929 12/14/19 0554  WBC 6.0 4.0  NEUTROABS 2.7  --   HGB 14.4 12.2*  HCT 43.6 37.5*  MCV 88.1 88.2  PLT 186 426   Basic Metabolic Panel: Recent Labs  Lab 12/13/19 1929 12/14/19 0554 12/15/19 0617  NA 144 143 144  K 3.6 3.4* 3.8  CL 107 109 112*  CO2 26 24 25   GLUCOSE 141* 99 98  BUN 40* 38* 28*  CREATININE 2.43* 1.84* 1.19  CALCIUM 9.9 8.9 8.8*   GFR: Estimated Creatinine Clearance: 61.3 mL/min (by C-G formula based on SCr of 1.19 mg/dL). Liver Function Tests: Recent Labs  Lab 12/13/19 1929  AST 38  ALT 21  ALKPHOS 55  BILITOT 1.0  PROT 7.6  ALBUMIN 4.4   No results for input(s): LIPASE, AMYLASE in the last 168 hours. No results for input(s): AMMONIA in the last 168 hours. Coagulation Profile: No results for input(s): INR, PROTIME in the last 168 hours. Cardiac Enzymes: No results for input(s): CKTOTAL, CKMB, CKMBINDEX, TROPONINI in the last 168 hours. BNP (last 3 results) No results for input(s): PROBNP in the last 8760 hours. HbA1C: Recent Labs     12/13/19 1929  HGBA1C 5.4   CBG: No results for input(s): GLUCAP in the last 168 hours. Lipid Profile: No results for input(s): CHOL, HDL, LDLCALC, TRIG, CHOLHDL, LDLDIRECT in the last 72 hours. Thyroid Function Tests: No results for input(s): TSH, T4TOTAL, FREET4, T3FREE, THYROIDAB in the last 72 hours. Anemia Panel: No results for input(s): VITAMINB12, FOLATE, FERRITIN, TIBC, IRON, RETICCTPCT in the last 72 hours. Sepsis Labs: Recent Labs  Lab 12/13/19 1929  LATICACIDVEN 1.1    Recent Results (from the past 240 hour(s))  Urine culture     Status: Abnormal   Collection Time: 12/06/19 10:55 PM   Specimen: Urine, Random  Result Value Ref Range Status   Specimen Description   Final    URINE, RANDOM Performed at [facility], [address] [place], [place] [address]    Special Requests   Final    NONE Performed at [facility], [phone]  [address] [place], [place] [address]    Culture >=100,000 COLONIES/mL ENTEROCOCCUS FAECALIS (A)  Final   Report Status 12/09/2019 FINAL  Final   Organism ID, Bacteria ENTEROCOCCUS FAECALIS (A)  Final      Susceptibility   Enterococcus faecalis - MIC*    AMPICILLIN <=2 SENSITIVE Sensitive     NITROFURANTOIN <=16 SENSITIVE Sensitive     VANCOMYCIN 1 SENSITIVE Sensitive     * >=100,000 COLONIES/mL ENTEROCOCCUS FAECALIS  SARS CORONAVIRUS 2 (TAT 6-24 HRS) Nasopharyngeal Nasopharyngeal Swab     Status: None   Collection Time: 12/13/19 10:14 PM   Specimen: Nasopharyngeal Swab  Result Value Ref Range Status   SARS Coronavirus 2 NEGATIVE NEGATIVE Final    Comment: (NOTE) SARS-CoV-2 target nucleic acids are NOT DETECTED.  The SARS-CoV-2 RNA is generally detectable in upper and lower respiratory specimens during the acute phase of infection. Negative results do not preclude SARS-CoV-2 infection, do not rule out co-infections with other pathogens, and should not be used as the sole basis for treatment or other patient  management decisions. Negative results must be combined with clinical observations, patient history, and epidemiological information. The expected result is Negative.  Fact Sheet for Patients: SugarRoll.be  Fact Sheet for Healthcare Providers: https://www.woods-mathews.com/  This test is not yet approved or cleared by the [place] FDA and  has been authorized for detection and/or diagnosis of SARS-CoV-2 by FDA under an Emergency Use Authorization (EUA). This EUA will remain  in effect (meaning this test can be used) for the duration of the COVID-19 declaration under Se ction 564(b)(1) of the Act, 21 U.S.C. section 360bbb-3(b)(1), unless the authorization is terminated or revoked sooner.  Performed at [facility], [place] [address], [place], [place] [address]          Radiology Studies: DG Chest 1 View  Result Date: 12/13/2019 CLINICAL DATA:  Un witnessed fall, hypotension EXAM: CHEST  1 VIEW COMPARISON:  09/25/2019 FINDINGS: Single frontal view of the chest demonstrates an unremarkable cardiac silhouette. The left upper lobe nodule seen on recent chest CT is again identified overlying the left anterior fourth rib, stable. No airspace disease, effusion, or pneumothorax. No acute displaced fracture. IMPRESSION: 1. Stable left upper lobe pulmonary nodule. PET CT again recommended if not performed in the interim. 2. Otherwise no acute intrathoracic process.  Cough Electronically Signed   By: Randa Ngo M.D.   On: 12/13/2019 20:01   CT Head Wo Contrast  Result Date: 12/13/2019 CLINICAL DATA:  Fall EXAM: CT HEAD WITHOUT CONTRAST TECHNIQUE: Contiguous axial images were obtained from the base of the skull through the vertex without intravenous contrast. COMPARISON:  12/07/2019 FINDINGS: Brain: There is no acute intracranial hemorrhage, mass effect, or edema. No new loss of gray-white differentiation. There is no extra-axial fluid  collection. Ventricles and sulci are stable in size and configuration. Patchy hypoattenuation in the supratentorial white matter likely reflects stable chronic microvascular ischemic changes. Vascular: No hyperdense vessel or unexpected calcification. Skull: Calvarium is unremarkable. Sinuses/Orbits: No acute finding. Other: None. IMPRESSION: No evidence of acute intracranial injury. Electronically Signed   By: Macy Mis M.D.   On: 12/13/2019 20:37   CT Cervical Spine Wo Contrast  Result Date: 12/13/2019 CLINICAL DATA:  Fall EXAM: CT CERVICAL SPINE WITHOUT CONTRAST TECHNIQUE: Multidetector CT imaging of the cervical spine was performed without intravenous contrast. Multiplanar CT image reconstructions were also generated. COMPARISON:  None. FINDINGS: Alignment: Anteroposterior alignment is maintained. Skull base and vertebrae: Decreased osseous mineralization.  Diffuse bridging osteophytes/syndesmophytes. No acute cervical spine fracture. Indeterminate lucent lesion within the right C2 lateral mass. Soft tissues and spinal canal: No prevertebral fluid or swelling. No visible canal hematoma. Disc levels: Multilevel degenerative changes are present including disc space narrowing, endplate osteophytes, and facet and uncovertebral hypertrophy. Upper chest: No apical lung mass. Other: None. IMPRESSION: No acute cervical spine fracture. Indeterminate lucent lesion of C2. Electronically Signed   By: Macy Mis M.D.   On: 12/13/2019 20:51   CT Maxillofacial Wo Contrast  Result Date: 12/13/2019 CLINICAL DATA:  Fall EXAM: CT MAXILLOFACIAL WITHOUT CONTRAST TECHNIQUE: Multidetector CT imaging of the maxillofacial structures was performed. Multiplanar CT image reconstructions were also generated. COMPARISON:  None. FINDINGS: Osseous: No acute facial fracture. Orbits: No intraorbital hematoma. Sinuses: Minor mucosal thickening. Soft tissues: There is a 11 mm soft tissue nodule of the superficial right parotid.  Limited intracranial: Dictated separately. IMPRESSION: No acute facial fracture. Indeterminate 11 mm soft tissue nodule of the right parotid. This may reflect a mildly enlarged lymph node or a primary parotid neoplasm. Electronically Signed   By: Macy Mis M.D.   On: 12/13/2019 20:55        Scheduled Meds: . DULoxetine  60 mg Oral Daily  . heparin  5,000 Units Subcutaneous Q8H  . potassium chloride  40 mEq Oral BID  . pregabalin  150 mg Oral TID  . risperiDONE  0.5 mg Oral BID  . traZODone  100 mg Oral QHS  . umeclidinium-vilanterol  1 puff Inhalation Daily   Continuous Infusions:    LOS: 1 day    Time spent: 25 minutes    Barb Merino, MD [facility] Pager [phone]

## 2019-12-15 NOTE — Plan of Care (Signed)
  Problem: Education: Goal: Knowledge of General Education information will improve Description: Including pain rating scale, medication(s)/side effects and non-pharmacologic comfort measures Outcome: Adequate for Discharge   Problem: Activity: Goal: Risk for activity intolerance will decrease Outcome: Adequate for Discharge   Problem: Coping: Goal: Level of anxiety will decrease Outcome: Adequate for Discharge   Problem: Elimination: Goal: Will not experience complications related to bowel motility Outcome: Adequate for Discharge Goal: Will not experience complications related to urinary retention Outcome: Adequate for Discharge   Problem: Elimination: Goal: Will not experience complications related to urinary retention Outcome: Adequate for Discharge   Problem: Pain Managment: Goal: General experience of comfort will improve Outcome: Adequate for Discharge   Problem: Safety: Goal: Ability to remain free from injury will improve Outcome: Adequate for Discharge   Problem: Skin Integrity: Goal: Risk for impaired skin integrity will decrease Outcome: Adequate for Discharge

## 2019-12-15 NOTE — TOC Progression Note (Signed)
Transition of Care [facility]) - Progression Note    Patient Details  Name: Aaron Gibbs MRN: [id number] Date of Birth: Oct 20, 1934  Transition of Care [facility]) CM/SW [place], RN Phone Number: 12/15/2019, 12:40 PM  Clinical Narrative:    Spoke with the patient and reviewed the bed offer, he accepted the offer from [facility], I started insurance auth from [facility] faxed clinical to [phone], the patient has had covid vaccine   Expected Discharge Plan: Skilled Nursing Facility Barriers to Discharge: Continued Medical Work up  Expected Discharge Plan and Services Expected Discharge Plan: [place]                                               Social Determinants of Health (SDOH) Interventions    Readmission Risk Interventions No flowsheet data found.

## 2019-12-16 MED ORDER — CLONAZEPAM 0.5 MG PO TABS
0.5000 mg | ORAL_TABLET | Freq: Two times a day (BID) | ORAL | 0 refills | Status: DC | PRN
Start: 2019-12-16 — End: 2019-12-26

## 2019-12-16 MED ORDER — OXYCODONE HCL 10 MG PO TABS: 10.0000 mg | ORAL_TABLET | Freq: Three times a day (TID) | ORAL | 0 refills | Status: DC | PRN

## 2019-12-16 MED ORDER — PREGABALIN 150 MG PO CAPS: 150.0000 mg | ORAL_CAPSULE | Freq: Three times a day (TID) | ORAL | 0 refills | Status: DC

## 2019-12-16 NOTE — Progress Notes (Addendum)
Called report at this time and spoke to [place], Therapist, sports at [employer] [phone]. Contacted the CM Delil[place] immediately afterwards to arrange for transport.

## 2019-12-16 NOTE — TOC Progression Note (Signed)
Transition of Care [facility]) - Progression Note    Patient Details  Name: Josemaria Brining MRN: [id number] Date of Birth: August 31, 1934  Transition of Care [facility]) CM/SW [place], RN Phone Number: 12/16/2019, 9:30 AM  Clinical Narrative:    Damaris Schooner to [name] the patient's daughter and notified her that the patient will be discharged today to [facility], She stated acceptance and stated that she would call her dad later on today and see how he is, I explained that I would arrange transportation  With EMS  Expected Discharge Plan: [place] Barriers to Discharge: Continued Medical Work up  Expected Discharge Plan and Services Expected Discharge Plan: [place]         Expected Discharge Date: 12/16/19                                     Social Determinants of Health (SDOH) Interventions    Readmission Risk Interventions No flowsheet data found.

## 2019-12-16 NOTE — TOC Transition Note (Signed)
Transition of Care [facility]) - CM/SW Discharge Note   Patient Details  Name: Aaron Gibbs MRN: [id number] Date of Birth: Oct 06, 1934  Transition of Care [facility]) CM/SW Contact:  Su Hilt, RN Phone Number: 12/16/2019, 2:07 PM   Clinical Narrative:    Bedside nurse called report to [facility] , the DC packet is on the chart EMS has been called by [facility] The bedside nurse is aware   Final next level of care: Skilled Nursing Facility Barriers to Discharge: Continued Medical Work up   Patient Goals and CMS Choice Patient states their goals for this hospitalization and ongoing recovery are:: to go home  with aide      Discharge Placement                       Discharge Plan and Services                                     Social Determinants of Health (SDOH) Interventions     Readmission Risk Interventions Readmission Risk Prevention Plan 12/16/2019  Transportation Screening Complete  PCP or Specialist Appt within 3-5 Days Complete  HRI or [place] Complete  Social Work Consult for [place] Planning/Counseling Complete  Palliative Care Screening Not Applicable  Medication Review Press photographer) Referral to Pharmacy

## 2019-12-16 NOTE — Progress Notes (Signed)
Transport  picked up patient at this time. PIV was removed. Discharge paperwork was given to transport team.

## 2019-12-16 NOTE — Discharge Summary (Signed)
Physician Discharge Summary  Aaron Gibbs QQV:[id number] DOB: 1934/08/27 DOA: 12/13/2019  PCP: Olin Hauser, DO  Admit date: 12/13/2019 Discharge date: 12/16/2019  Admitted From: Home Disposition: Skilled nursing facility  Recommendations for Outpatient Follow-up:  1. Follow up with PCP in 1-2 weeks 2. Please obtain BMP/CBC in one week   Discharge Condition: Stable CODE STATUS: Full code Diet recommendation: Low-salt diet  Discharge summary:  85 year old male with history of COPD, hypertension, hyperlipidemia, anxiety depression with history of suicidal ideation, chronic pain syndrome on oxycodone and Lyrica presented to the emergency room after a fall.  Apparently, patient lives by himself with about 5 hours of care at home.  Recently he has been having trouble with a fall and his medications.  He also had some suicidal ideation and was recently at [facility] for 4 days waiting for psychiatric bed and later cleared and discharged to home.  Patient reported being weak.  Patient's daughter reported he is progressively getting weaker and weaker. In the emergency room, blood pressure 104/59.  Afebrile.  On room air.  BUN/creatinine, 40/2.43 (known normal creatinine 1.07.)  COVID-19 negative.  Chest x-ray normal with all findings.  Skeletal survey negative.   Assessment & plan of care:  Acute kidney injury: Multifactorial.  Suspect prerenal.  On antihypertensives, hypotensive episodes and poor appetite and poor access to fluid. Clinically improving.  Renal function normalized.  Antihypertensives resumed.  Advanced physical deconditioning with multiple medical problems and recent hospitalizations: Patient with advanced physical debility.  Blindness.  Work with PT OT.  Not safe to stay alone at home.   He will benefit with inpatient therapies at the skilled nursing facility.    Multiple fall: Multifactorial.  Multifactorial sensory deficit as well as polypharmacy.  Skeletal  survey negative.  All-time fall precautions.  Rehab.  Depression/anxiety: Recent ER visit for suicidal ideation.  Denies current suicidal ideation.  Psychiatric recommended following medications that he is getting Cymbalta 60 mg daily Trazodone 100 mg at night Klonopin 0.5 mg twice daily as needed for anxiety Risperdal 0.5 mg twice daily All medications prescribed.  Hypertension: Blood pressure is improved.  Antihypertensives resumed.  COPD: Breathing is better.  Chronic pain syndrome: Probably contributing to fall and polypharmacy.  Patient is chronically on oxycodone Lyrica at that we will resume.  Left upper lobe pulmonary nodule: Found on recent CAT scan.  If patient adequately improves, he will benefit with outpatient PET scan.  Patient will need a good rehab and improve functional mobility first.  Enterococcal UTI: Diagnosed on last ER visit.  Asymptomatic.  Will not treat.  Chronically sick.  Currently stable.  Discharged to skilled level of care.  Discharge Diagnoses:  Principal Problem:   AKI (acute kidney injury) ([place]) Active Problems:   Essential hypertension   COPD (chronic obstructive pulmonary disease) ([place])   Fall at home, initial encounter   Depression with anxiety   Acute kidney failure [facility])    Discharge Instructions  Discharge Instructions    Diet - low sodium heart healthy   Complete by: As directed    Increase activity slowly   Complete by: As directed      Allergies as of 12/16/2019      Reactions   Azithromycin Shortness Of Breath      Medication List    STOP taking these medications   meloxicam 15 MG tablet Commonly known as: MOBIC     TAKE these medications   albuterol 108 (90 Base) MCG/ACT inhaler Commonly known as: VENTOLIN  HFA INHALE 1-2 PUFFS INTO THE LUNGS EVERY 6 HOURS AS NEEDED   amLODipine 10 MG tablet Commonly known as: NORVASC Take 1 tablet (10 mg total) by mouth daily.   Anoro Ellipta 62.5-25 MCG/INH Aepb Generic  drug: umeclidinium-vilanterol Inhale 1 puff into the lungs daily.   aspirin EC 81 MG tablet Take 81 mg by mouth daily.   buPROPion 150 MG 12 hr tablet Commonly known as: WELLBUTRIN SR Take 1 tablet by mouth 2 (two) times daily.   clonazePAM 0.5 MG tablet Commonly known as: KlonoPIN Take 1 tablet (0.5 mg total) by mouth 2 (two) times daily as needed for up to 5 days for anxiety.   diclofenac sodium 1 % Gel Commonly known as: VOLTAREN Apply 1 application topically as needed.   DULoxetine 20 MG capsule Commonly known as: Cymbalta Take 4 capsules (80 mg total) by mouth daily.   fenofibrate 145 MG tablet Commonly known as: TRICOR Take 1 tablet (145 mg total) by mouth daily.   ferrous sulfate 325 (65 FE) MG tablet Take 325 mg by mouth daily with breakfast.   multivitamin with minerals Tabs tablet Take 1 tablet by mouth daily.   Oxycodone HCl 10 MG Tabs Take 1 tablet (10 mg total) by mouth 3 (three) times daily as needed for up to 5 days. Must last 30 days. What changed: Another medication with the same name was removed. Continue taking this medication, and follow the directions you see here.   pantoprazole 40 MG tablet Commonly known as: PROTONIX Take 40 mg by mouth daily.   pregabalin 150 MG capsule Commonly known as: LYRICA Take 1 capsule (150 mg total) by mouth 3 (three) times daily for 5 days. Must last 30 days   risperiDONE 2 MG tablet Commonly known as: RisperDAL Take 1 tablet (2 mg total) by mouth at bedtime.   timolol 0.5 % ophthalmic solution Commonly known as: TIMOPTIC 1 drop 2 (two) times daily. Apply one drop to eye(s)   traZODone 50 MG tablet Commonly known as: DESYREL TAKE 1 TABLET BY MOUTH AT BEDTIME       Contact information for after-discharge care    [facility] .   Service: Skilled Nursing Contact information: [place] [phone]                  Allergies  Allergen Reactions  . Azithromycin Shortness Of Breath     Procedures/Studies: DG Chest 1 View  Result Date: 12/13/2019 CLINICAL DATA:  Un witnessed fall, hypotension EXAM: CHEST  1 VIEW COMPARISON:  09/25/2019 FINDINGS: Single frontal view of the chest demonstrates an unremarkable cardiac silhouette. The left upper lobe nodule seen on recent chest CT is again identified overlying the left anterior fourth rib, stable. No airspace disease, effusion, or pneumothorax. No acute displaced fracture. IMPRESSION: 1. Stable left upper lobe pulmonary nodule. PET CT again recommended if not performed in the interim. 2. Otherwise no acute intrathoracic process.  Cough Electronically Signed   By: Randa Ngo M.D.   On: 12/13/2019 20:01   CT Head Wo Contrast  Result Date: 12/13/2019 CLINICAL DATA:  Fall EXAM: CT HEAD WITHOUT CONTRAST TECHNIQUE: Contiguous axial images were obtained from the base of the skull through the vertex without intravenous contrast. COMPARISON:  12/07/2019 FINDINGS: Brain: There is no acute intracranial hemorrhage, mass effect, or edema. No new loss of gray-white differentiation. There is no extra-axial fluid collection. Ventricles and sulci are stable  in size and configuration. Patchy hypoattenuation in the supratentorial white matter likely reflects stable chronic microvascular ischemic changes. Vascular: No hyperdense vessel or unexpected calcification. Skull: Calvarium is unremarkable. Sinuses/Orbits: No acute finding. Other: None. IMPRESSION: No evidence of acute intracranial injury. Electronically Signed   By: Macy Mis M.D.   On: 12/13/2019 20:37   CT Head Wo Contrast  Result Date: 12/07/2019 CLINICAL DATA:  Altered mental status EXAM: CT HEAD WITHOUT CONTRAST TECHNIQUE: Contiguous axial images were obtained from the base of the skull through the vertex without intravenous contrast. COMPARISON:  September 13, 2019 FINDINGS: Brain: No evidence of acute territorial  infarction, hemorrhage, hydrocephalus,extra-axial collection or mass lesion/mass effect. There is dilatation the ventricles and sulci consistent with age-related atrophy. Low-attenuation changes in the deep white matter consistent with small vessel ischemia. Vascular: No hyperdense vessel or unexpected calcification. Skull: The skull is intact. No fracture or focal lesion identified. Sinuses/Orbits: The visualized paranasal sinuses and mastoid air cells are clear. The orbits and globes intact. Other: None IMPRESSION: No acute intracranial abnormality. Findings consistent with age related atrophy and chronic small vessel ischemia Electronically Signed   By: Prudencio Pair M.D.   On: 12/07/2019 01:31   CT Angio Chest PE W and/or Wo Contrast  Result Date: 12/07/2019 CLINICAL DATA:  Shortness of breath, anxiety, palpitations. Unable to sleep. EXAM: CT ANGIOGRAPHY CHEST WITH CONTRAST TECHNIQUE: Multidetector CT imaging of the chest was performed using the standard protocol during bolus administration of intravenous contrast. Multiplanar CT image reconstructions and MIPs were obtained to evaluate the vascular anatomy. CONTRAST:  151mL OMNIPAQUE IOHEXOL 350 MG/ML SOLN COMPARISON:  Most recent radiograph 09/25/2019. Most recent chest CTA 07/02/2018 FINDINGS: Cardiovascular: There are no filling defects within the pulmonary arteries to suggest pulmonary embolus. Tortuous thoracic aorta without dissection or acute aortic syndrome. Mild cardiomegaly. No pericardial effusion. Mediastinum/Nodes: Few calcified subcarinal lymph nodes consistent with prior granulomatous disease. There is a prominent right lower paratracheal node measuring 12 mm, previously 10 mm. Borderline left hilar node measuring 10 mm. Small right hilar nodes are subcentimeter. Previous right thyroid nodule is obscured by current positioning and streak artifact from dense IV contrast in the subclavian vessels. No esophageal wall thickening. Lungs/Pleura:  Exaggerated thoracic kyphosis distorts normal anatomy. There is a 17 x 16 mm nodule in the lingula with slightly irregular margins, not present on prior exam. Adjacent ill-defined opacities in the lingula extend to the subpleural region. Small to moderate right pleural effusion with adjacent atelectasis. Trace left pleural effusion. Scattered ill-defined ground-glass opacities within both lungs which are nonspecific. No definite septal thickening. Elongated filling defect in the upper trachea is likely retained mucus, but nonspecific. Upper Abdomen: No acute finding, mild motion artifact limitations. Musculoskeletal: Significantly exaggerated thoracic kyphosis. Bony under mineralization with flowing anterior osteophytes/syndesmophytes throughout the thoracic spine. Review of the MIP images confirms the above findings. IMPRESSION: 1. No pulmonary embolus. 2. A 17 x 16 mm nodule in the lingula with slightly irregular margins, not present on prior exam. This is suspicious for neoplasm. Consider pulmonary referral. Further evaluation with PET-CT could be considered versus short interval follow-up CT. 3. Small to moderate right and trace left pleural effusions with adjacent atelectasis. 4. Scattered ill-defined ground-glass opacities within both lungs are nonspecific, may be infectious or inflammatory. A component of atelectasis is also considered. 5. Prominent right lower paratracheal and left hilar nodes are likely reactive. 6. Probable retained mucus in the trachea. Aortic Atherosclerosis (ICD10-I70.0). Electronically Signed   By: Keith Rake  M.D.   On: 12/07/2019 01:36   CT Cervical Spine Wo Contrast  Result Date: 12/13/2019 CLINICAL DATA:  Fall EXAM: CT CERVICAL SPINE WITHOUT CONTRAST TECHNIQUE: Multidetector CT imaging of the cervical spine was performed without intravenous contrast. Multiplanar CT image reconstructions were also generated. COMPARISON:  None. FINDINGS: Alignment: Anteroposterior alignment  is maintained. Skull base and vertebrae: Decreased osseous mineralization. Diffuse bridging osteophytes/syndesmophytes. No acute cervical spine fracture. Indeterminate lucent lesion within the right C2 lateral mass. Soft tissues and spinal canal: No prevertebral fluid or swelling. No visible canal hematoma. Disc levels: Multilevel degenerative changes are present including disc space narrowing, endplate osteophytes, and facet and uncovertebral hypertrophy. Upper chest: No apical lung mass. Other: None. IMPRESSION: No acute cervical spine fracture. Indeterminate lucent lesion of C2. Electronically Signed   By: Macy Mis M.D.   On: 12/13/2019 20:51   CT Maxillofacial Wo Contrast  Result Date: 12/13/2019 CLINICAL DATA:  Fall EXAM: CT MAXILLOFACIAL WITHOUT CONTRAST TECHNIQUE: Multidetector CT imaging of the maxillofacial structures was performed. Multiplanar CT image reconstructions were also generated. COMPARISON:  None. FINDINGS: Osseous: No acute facial fracture. Orbits: No intraorbital hematoma. Sinuses: Minor mucosal thickening. Soft tissues: There is a 11 mm soft tissue nodule of the superficial right parotid. Limited intracranial: Dictated separately. IMPRESSION: No acute facial fracture. Indeterminate 11 mm soft tissue nodule of the right parotid. This may reflect a mildly enlarged lymph node or a primary parotid neoplasm. Electronically Signed   By: Macy Mis M.D.   On: 12/13/2019 20:55   (Echo, Carotid, EGD, Colonoscopy, ERCP)    Subjective: Patient was seen and examined.  No overnight events.  He denies any complaints other than weakness.  Patient is agreeable to go to skilled nursing rehab.   Discharge Exam: Vitals:   12/15/19 2347 12/16/19 0807  BP: (!) 110/48 (!) 129/54  Pulse: 74 63  Resp: 20 15  Temp: 98.1 F (36.7 C) 97.9 F (36.6 C)  SpO2: 94% 97%   Vitals:   12/15/19 0743 12/15/19 1546 12/15/19 2347 12/16/19 0807  BP: (!) 120/49 125/60 (!) 110/48 (!) 129/54  Pulse:  74 75 74 63  Resp: 17 17 20 15   Temp: 97.8 F (36.6 C) 97.9 F (36.6 C) 98.1 F (36.7 C) 97.9 F (36.6 C)  TempSrc: Oral  Oral Oral  SpO2: 93% 96% 94% 97%  Weight:      Height:        General: Pt is alert, awake, not in acute distress, not in any distress.  He is blind.  On room air.  Alert awake and oriented x4. Denies any suicidal or homicidal ideations.  Denies any delusions or hallucinations. Cardiovascular: RRR, S1/S2 +, no rubs, no gallops Respiratory: CTA bilaterally, no wheezing, no rhonchi Abdominal: Soft, NT, ND, bowel sounds + Extremities: no edema, no cyanosis    The results of significant diagnostics from this hospitalization (including imaging, microbiology, ancillary and laboratory) are listed below for reference.     Microbiology: Recent Results (from the past 240 hour(s))  Urine culture     Status: Abnormal   Collection Time: 12/06/19 10:55 PM   Specimen: Urine, Random  Result Value Ref Range Status   Specimen Description   Final    URINE, RANDOM Performed at [facility], [address] [place], [place] [address]    Special Requests   Final    NONE Performed at [facility], [address], [place], [place] [address]    Culture >=100,000 COLONIES/mL ENTEROCOCCUS FAECALIS (  A)  Final   Report Status 12/09/2019 FINAL  Final   Organism ID, Bacteria ENTEROCOCCUS FAECALIS (A)  Final      Susceptibility   Enterococcus faecalis - MIC*    AMPICILLIN <=2 SENSITIVE Sensitive     NITROFURANTOIN <=16 SENSITIVE Sensitive     VANCOMYCIN 1 SENSITIVE Sensitive     * >=100,000 COLONIES/mL ENTEROCOCCUS FAECALIS  SARS CORONAVIRUS 2 (TAT 6-24 HRS) Nasopharyngeal Nasopharyngeal Swab     Status: None   Collection Time: 12/13/19 10:14 PM   Specimen: Nasopharyngeal Swab  Result Value Ref Range Status   SARS Coronavirus 2 NEGATIVE NEGATIVE Final    Comment: (NOTE) SARS-CoV-2 target nucleic acids are NOT DETECTED.  The SARS-CoV-2 RNA is generally  detectable in upper and lower respiratory specimens during the acute phase of infection. Negative results do not preclude SARS-CoV-2 infection, do not rule out co-infections with other pathogens, and should not be used as the sole basis for treatment or other patient management decisions. Negative results must be combined with clinical observations, patient history, and epidemiological information. The expected result is Negative.  Fact Sheet for Patients: SugarRoll.be  Fact Sheet for Healthcare Providers: https://www.woods-mathews.com/  This test is not yet approved or cleared by the [place] FDA and  has been authorized for detection and/or diagnosis of SARS-CoV-2 by FDA under an Emergency Use Authorization (EUA). This EUA will remain  in effect (meaning this test can be used) for the duration of the COVID-19 declaration under Se ction 564(b)(1) of the Act, 21 U.S.C. section 360bbb-3(b)(1), unless the authorization is terminated or revoked sooner.  Performed at [facility], [place] [address], [place], [place] [address]      Labs: BNP (last 3 results) Recent Labs    02/13/19 1748 03/28/19 2132 09/25/19 1858  BNP 35.0 52.0 712.4*   Basic Metabolic Panel: Recent Labs  Lab 12/13/19 1929 12/14/19 0554 12/15/19 0617  NA 144 143 144  K 3.6 3.4* 3.8  CL 107 109 112*  CO2 26 24 25   GLUCOSE 141* 99 98  BUN 40* 38* 28*  CREATININE 2.43* 1.84* 1.19  CALCIUM 9.9 8.9 8.8*   Liver Function Tests: Recent Labs  Lab 12/13/19 1929  AST 38  ALT 21  ALKPHOS 55  BILITOT 1.0  PROT 7.6  ALBUMIN 4.4   No results for input(s): LIPASE, AMYLASE in the last 168 hours. No results for input(s): AMMONIA in the last 168 hours. CBC: Recent Labs  Lab 12/13/19 1929 12/14/19 0554  WBC 6.0 4.0  NEUTROABS 2.7  --   HGB 14.4 12.2*  HCT 43.6 37.5*  MCV 88.1 88.2  PLT 186 155   Cardiac Enzymes: No results for input(s): CKTOTAL,  CKMB, CKMBINDEX, TROPONINI in the last 168 hours. BNP: Invalid input(s): POCBNP CBG: No results for input(s): GLUCAP in the last 168 hours. D-Dimer No results for input(s): DDIMER in the last 72 hours. Hgb A1c Recent Labs    12/13/19 1929  HGBA1C 5.4   Lipid Profile No results for input(s): CHOL, HDL, LDLCALC, TRIG, CHOLHDL, LDLDIRECT in the last 72 hours. Thyroid function studies No results for input(s): TSH, T4TOTAL, T3FREE, THYROIDAB in the last 72 hours.  Invalid input(s): FREET3 Anemia work up No results for input(s): VITAMINB12, FOLATE, FERRITIN, TIBC, IRON, RETICCTPCT in the last 72 hours. Urinalysis    Component Value Date/Time   COLORURINE AMBER (A) 12/13/2019 2353   APPEARANCEUR CLOUDY (A) 12/13/2019 2353   LABSPEC 1.026 12/13/2019 2353   PHURINE 5.0 12/13/2019  [place] 12/13/2019 2353   HGBUR NEGATIVE 12/13/2019 2353   BILIRUBINUR SMALL (A) 12/13/2019 2353   KETONESUR 5 (A) 12/13/2019 2353   PROTEINUR 100 (A) 12/13/2019 2353   NITRITE NEGATIVE 12/13/2019 2353   LEUKOCYTESUR SMALL (A) 12/13/2019 2353   Sepsis Labs Invalid input(s): PROCALCITONIN,  WBC,  LACTICIDVEN Microbiology Recent Results (from the past 240 hour(s))  Urine culture     Status: Abnormal   Collection Time: 12/06/19 10:55 PM   Specimen: Urine, Random  Result Value Ref Range Status   Specimen Description   Final    URINE, RANDOM Performed at [facility], [address], [place], [place] [address]    Special Requests   Final    NONE Performed at [facility], [address] [place], [place] [address]    Culture >=100,000 COLONIES/mL ENTEROCOCCUS FAECALIS (A)  Final   Report Status 12/09/2019 FINAL  Final   Organism ID, Bacteria ENTEROCOCCUS FAECALIS (A)  Final      Susceptibility   Enterococcus faecalis - MIC*    AMPICILLIN <=2 SENSITIVE Sensitive     NITROFURANTOIN <=16 SENSITIVE Sensitive     VANCOMYCIN 1 SENSITIVE Sensitive     * >=100,000  COLONIES/mL ENTEROCOCCUS FAECALIS  SARS CORONAVIRUS 2 (TAT 6-24 HRS) Nasopharyngeal Nasopharyngeal Swab     Status: None   Collection Time: 12/13/19 10:14 PM   Specimen: Nasopharyngeal Swab  Result Value Ref Range Status   SARS Coronavirus 2 NEGATIVE NEGATIVE Final    Comment: (NOTE) SARS-CoV-2 target nucleic acids are NOT DETECTED.  The SARS-CoV-2 RNA is generally detectable in upper and lower respiratory specimens during the acute phase of infection. Negative results do not preclude SARS-CoV-2 infection, do not rule out co-infections with other pathogens, and should not be used as the sole basis for treatment or other patient management decisions. Negative results must be combined with clinical observations, patient history, and epidemiological information. The expected result is Negative.  Fact Sheet for Patients: SugarRoll.be  Fact Sheet for Healthcare Providers: https://www.woods-mathews.com/  This test is not yet approved or cleared by the [place] FDA and  has been authorized for detection and/or diagnosis of SARS-CoV-2 by FDA under an Emergency Use Authorization (EUA). This EUA will remain  in effect (meaning this test can be used) for the duration of the COVID-19 declaration under Se ction 564(b)(1) of the Act, 21 U.S.C. section 360bbb-3(b)(1), unless the authorization is terminated or revoked sooner.  Performed at [facility], [place] [address] [place], [place] [address]      Time coordinating discharge:  40 minutes  SIGNED:   Barb Merino, MD  [facility] 12/16/2019, 11:15 AM

## 2019-12-16 NOTE — TOC Progression Note (Signed)
Transition of Care [facility]) - Progression Note    Patient Details  Name: Aaron Gibbs MRN: [id number] Date of Birth: April 25, 1935  Transition of Care [facility]) CM/SW [place], RN Phone Number: 12/16/2019, 8:52 AM  Clinical Narrative:    Received a call from [place] approved start date 6/28 next review date 6/20 Auth number [id number] Ref number [id number], Notified the physician and Claiborne Billings at [facility]   Expected Discharge Plan: [place] Barriers to Discharge: Continued Medical Work up  Expected Discharge Plan and Services Expected Discharge Plan: [place]                                               Social Determinants of Health (SDOH) Interventions    Readmission Risk Interventions No flowsheet data found.

## 2019-12-23 ENCOUNTER — Telehealth: Payer: Self-pay

## 2019-12-23 NOTE — Telephone Encounter (Signed)
Copied from [place] [phone]. Topic: General - Other >> Dec 19, 2019 53:97 AM [name] wrote: Reason for CRM: PT daughter is calling requesting a letter  from his PCP stating that her farther is no longer able to handle his on affairs.  Aaron Gibbs is the daughter [phone]

## 2019-12-23 NOTE — Telephone Encounter (Signed)
Attempted to call patient after hours today Tues 7/6  I usually like to speak with the family member directly before I write these letters, so I can better understand the situation and make sure I address all of the concerns or include all information and figure out where the letter is going or how it will be used etc.  I left a detailed voicemail for [place].  If she calls back - try to get as much information as she is able to share about this OR a preferred time for me to call her. Likely is doing to be after clinic hours, unless I have a free moment during the day.  Or can let her know I usually prefer to call patients Thursday afternoons during admin time and that would likely be when I would write the letter this week as well.  [name], [facility] 12/23/2019, 6:19 PM

## 2019-12-24 ENCOUNTER — Telehealth: Payer: Self-pay

## 2019-12-24 ENCOUNTER — Ambulatory Visit: Payer: Self-pay | Admitting: Pharmacist

## 2019-12-24 NOTE — Chronic Care Management (AMB) (Signed)
°  Chronic Care Management   Follow Up Note   12/24/2019 Name: Aaron Gibbs MRN: [id number] DOB: 06-17-1935  Referred by: Olin Hauser, DO Reason for referral : Chronic Care Management (Patient Phone Call)   Aaron Gibbs is a 85 y.o. year old male who is a primary care patient of Olin Hauser, DO. The CCM team was consulted for assistance with chronic disease management and care coordination needs.    Was unable to reach patient via telephone today and have left HIPAA compliant voicemail asking patient to return my call.  Plan  The care management team will reach out to the patient again over the next 30 days.   [name], PharmD, [facility] [employer] [phone]

## 2019-12-25 NOTE — Telephone Encounter (Signed)
Called patient's daughter [name].  Spoke to her briefly. I asked her a few specific questions about the content of this letter.  Specifically if it just needs a medical statement that he can no longer manage.  OR if it needs any diagnosis or diagnosis code supporting it, and any examples of reasons why he can no longer manage.  Also I asked to clarify if it requires that I designate who should then instead manage the financial estate.  She will clarify and call us back with this info and then I can write the letter whenever I hear back.  If she gets letter by next week that is fine. Patient is in skilled nursing rehab right now and may eventually transfer to ALF or SNF in future as he has had some continued decline in health.  [name], [facility] 12/25/2019, 2:24 PM

## 2019-12-26 ENCOUNTER — Emergency Department
Admission: EM | Admit: 2019-12-26 | Discharge: 2019-12-29 | Disposition: A | Discharge status: Home / Self Care | Payer: Medicare Other | Attending: Emergency Medicine | Admitting: Emergency Medicine

## 2019-12-26 ENCOUNTER — Emergency Department: Payer: Medicare Other

## 2019-12-26 ENCOUNTER — Other Ambulatory Visit: Payer: Self-pay

## 2019-12-26 ENCOUNTER — Encounter: Payer: Self-pay | Admitting: Emergency Medicine

## 2019-12-26 DIAGNOSIS — R531 Weakness: Secondary | ICD-10-CM

## 2019-12-26 DIAGNOSIS — J449 Chronic obstructive pulmonary disease, unspecified: Secondary | ICD-10-CM | POA: Insufficient documentation

## 2019-12-26 DIAGNOSIS — Z20822 Contact with and (suspected) exposure to covid-19: Secondary | ICD-10-CM | POA: Diagnosis not present

## 2019-12-26 DIAGNOSIS — R0602 Shortness of breath: Secondary | ICD-10-CM | POA: Insufficient documentation

## 2019-12-26 DIAGNOSIS — Z87891 Personal history of nicotine dependence: Secondary | ICD-10-CM | POA: Diagnosis not present

## 2019-12-26 DIAGNOSIS — R350 Frequency of micturition: Secondary | ICD-10-CM | POA: Diagnosis present

## 2019-12-26 DIAGNOSIS — N390 Urinary tract infection, site not specified: Secondary | ICD-10-CM | POA: Diagnosis not present

## 2019-12-26 DIAGNOSIS — Z7982 Long term (current) use of aspirin: Secondary | ICD-10-CM | POA: Diagnosis not present

## 2019-12-26 DIAGNOSIS — I10 Essential (primary) hypertension: Secondary | ICD-10-CM | POA: Diagnosis not present

## 2019-12-26 DIAGNOSIS — Z85828 Personal history of other malignant neoplasm of skin: Secondary | ICD-10-CM | POA: Insufficient documentation

## 2019-12-26 DIAGNOSIS — Z79899 Other long term (current) drug therapy: Secondary | ICD-10-CM | POA: Diagnosis not present

## 2019-12-26 LAB — CBC
HCT: 40.2 % (ref 39.0–52.0)
Hemoglobin: 13.6 g/dL (ref 13.0–17.0)
MCH: 29.2 pg (ref 26.0–34.0)
MCHC: 33.8 g/dL (ref 30.0–36.0)
MCV: 86.3 fL (ref 80.0–100.0)
Platelets: 190 10*3/uL (ref 150–400)
RBC: 4.66 MIL/uL (ref 4.22–5.81)
RDW: 14.3 % (ref 11.5–15.5)
WBC: 4.6 10*3/uL (ref 4.0–10.5)
nRBC: 0 % (ref 0.0–0.2)

## 2019-12-26 LAB — SARS CORONAVIRUS 2 BY RT PCR (HOSPITAL ORDER, PERFORMED IN ~~LOC~~ HOSPITAL LAB): SARS Coronavirus 2: NEGATIVE

## 2019-12-26 LAB — COMPREHENSIVE METABOLIC PANEL
ALT: 20 U/L (ref 0–44)
AST: 24 U/L (ref 15–41)
Albumin: 4.2 g/dL (ref 3.5–5.0)
Alkaline Phosphatase: 51 U/L (ref 38–126)
Anion gap: 8 (ref 5–15)
BUN: 15 mg/dL (ref 8–23)
CO2: 27 mmol/L (ref 22–32)
Calcium: 9.7 mg/dL (ref 8.9–10.3)
Chloride: 104 mmol/L (ref 98–111)
Creatinine, Ser: 1.07 mg/dL (ref 0.61–1.24)
GFR calc Af Amer: >60 mL/min (ref >60)
GFR calc non Af Amer: >60 mL/min (ref >60)
Glucose, Bld: 115 mg/dL — ABNORMAL HIGH (ref 70–99)
Potassium: 4.1 mmol/L (ref 3.5–5.1)
Sodium: 139 mmol/L (ref 135–145)
Total Bilirubin: 0.6 mg/dL (ref 0.3–1.2)
Total Protein: 7.4 g/dL (ref 6.5–8.1)

## 2019-12-26 LAB — URINALYSIS, COMPLETE (UACMP) WITH MICROSCOPIC
Bacteria, UA: NONE SEEN
Bilirubin Urine: NEGATIVE
Glucose, UA: NEGATIVE mg/dL
Hgb urine dipstick: NEGATIVE
Ketones, ur: NEGATIVE mg/dL
Nitrite: NEGATIVE
Protein, ur: 30 mg/dL — AB
Specific Gravity, Urine: 1.011 (ref 1.005–1.030)
pH: 7 (ref 5.0–8.0)

## 2019-12-26 MED ORDER — FENOFIBRATE 160 MG PO TABS
160.0000 mg | ORAL_TABLET | Freq: Every day | ORAL | Status: DC
  Administered 2019-12-26 – 2019-12-29 (×4): 160 mg via ORAL
  Filled 2019-12-26 (×4): qty 1

## 2019-12-26 MED ORDER — UMECLIDINIUM-VILANTEROL 62.5-25 MCG/INH IN AEPB
1.0000 | INHALATION_SPRAY | Freq: Every day | RESPIRATORY_TRACT | Status: DC
  Administered 2019-12-26 – 2019-12-27 (×2): 1 via RESPIRATORY_TRACT
  Filled 2019-12-26: qty 14

## 2019-12-26 MED ORDER — CEPHALEXIN 500 MG PO CAPS: 500.0000 mg | ORAL_CAPSULE | Freq: Three times a day (TID) | ORAL | 0 refills | Status: DC

## 2019-12-26 MED ORDER — ALBUTEROL SULFATE (2.5 MG/3ML) 0.083% IN NEBU
2.5000 mg | INHALATION_SOLUTION | Freq: Four times a day (QID) | RESPIRATORY_TRACT | Status: DC | PRN
  Administered 2019-12-28: 2.5 mg via RESPIRATORY_TRACT
  Administered 2019-12-29: 5 mg via RESPIRATORY_TRACT
  Filled 2019-12-26: qty 6
  Filled 2019-12-26: qty 3

## 2019-12-26 MED ORDER — ASPIRIN EC 81 MG PO TBEC
81.0000 mg | DELAYED_RELEASE_TABLET | Freq: Every day | ORAL | Status: DC
  Administered 2019-12-26 – 2019-12-29 (×4): 81 mg via ORAL
  Filled 2019-12-26 (×4): qty 1

## 2019-12-26 MED ORDER — DULOXETINE HCL 60 MG PO CPEP
80.0000 mg | ORAL_CAPSULE | Freq: Every day | ORAL | Status: DC
  Administered 2019-12-26 – 2019-12-29 (×4): 80 mg via ORAL
  Filled 2019-12-26 (×4): qty 1

## 2019-12-26 MED ORDER — AMLODIPINE BESYLATE 5 MG PO TABS
10.0000 mg | ORAL_TABLET | Freq: Every day | ORAL | Status: DC
  Administered 2019-12-26 – 2019-12-29 (×2): 10 mg via ORAL
  Filled 2019-12-26 (×4): qty 2

## 2019-12-26 MED ORDER — FERROUS SULFATE 325 (65 FE) MG PO TABS
325.0000 mg | ORAL_TABLET | Freq: Every day | ORAL | Status: DC
  Administered 2019-12-26 – 2019-12-29 (×4): 325 mg via ORAL
  Filled 2019-12-26 (×4): qty 1

## 2019-12-26 MED ORDER — RISPERIDONE 1 MG PO TABS
2.0000 mg | ORAL_TABLET | Freq: Every day | ORAL | Status: DC
  Administered 2019-12-26 – 2019-12-28 (×3): 2 mg via ORAL
  Filled 2019-12-26 (×3): qty 2

## 2019-12-26 MED ORDER — CEPHALEXIN 500 MG PO CAPS
500.0000 mg | ORAL_CAPSULE | Freq: Three times a day (TID) | ORAL | Status: DC
  Administered 2019-12-26 – 2019-12-29 (×11): 500 mg via ORAL
  Filled 2019-12-26 (×12): qty 1

## 2019-12-26 MED ORDER — TIMOLOL MALEATE 0.5 % OP SOLN
1.0000 [drp] | Freq: Two times a day (BID) | OPHTHALMIC | Status: DC
  Administered 2019-12-26 – 2019-12-28 (×5): 1 [drp] via OPHTHALMIC
  Filled 2019-12-26: qty 5

## 2019-12-26 MED ORDER — PANTOPRAZOLE SODIUM 40 MG PO TBEC
40.0000 mg | DELAYED_RELEASE_TABLET | Freq: Every day | ORAL | Status: DC
  Administered 2019-12-26 – 2019-12-29 (×4): 40 mg via ORAL
  Filled 2019-12-26 (×4): qty 1

## 2019-12-26 MED ORDER — TRAZODONE HCL 50 MG PO TABS
50.0000 mg | ORAL_TABLET | Freq: Every day | ORAL | Status: DC
  Administered 2019-12-26 – 2019-12-28 (×3): 50 mg via ORAL
  Filled 2019-12-26 (×3): qty 1

## 2019-12-26 NOTE — ED Notes (Signed)
Asked "if this was the star destroyer." Reoriented patient to hospital.

## 2019-12-26 NOTE — ED Notes (Signed)
This tech relieved Morgan,NT as Actuary. Tech currently sitting with pt. Pt has eyes closed at this time and is calm.

## 2019-12-26 NOTE — ED Notes (Signed)
Pt went to CT

## 2019-12-26 NOTE — ED Notes (Signed)
Pt restless in bed. Pt constantly requesting for head of bed to be put up and down. Pt talked about his elbows and "putting a cloth underneath his neck." He became agitated and cussed about the bed.

## 2019-12-26 NOTE — ED Notes (Signed)
Sitter at bedside for safety.  Per report from previous RN pt had been trying to get out of bed.  Pt is alert and oriented X4.  Pt requesting head higher in bed.  This RN and sitter pulled pt up in bed and repositioned.  Pt wanting sitter to move each arm certain way stating "I want this elbow out".  Sitter and this RN not sure exactly what patient wants but pt is able to move all extremities.  Pt states to sitter "don't you know what an elbow is"? Pt informed RN was in rehab for weakness.  Encouraged pt to move his extremities since able to wear he would like because the more he does for himself the stronger he will get and we will help him when unable to do it himself.  Meal tray ordered for pt and pt informed of this.

## 2019-12-26 NOTE — ED Notes (Signed)
Pt checked and dry at this time. Sitter at bedside. Pt pulled up in bed and repositioned

## 2019-12-26 NOTE — ED Notes (Signed)
Darden Palmer, daughter, called, reports "will be by tomorrow"  msg given to pt

## 2019-12-26 NOTE — ED Provider Notes (Signed)
CT chest demonstrates possible lingular opacity that needs 3 to 41-month follow-up.  Discussed with patient   Aaron Drafts, MD 12/26/19 1323

## 2019-12-26 NOTE — ED Notes (Signed)
Case manager at bedside. Pt pulls off pants and diaper "to try and get some breath"

## 2019-12-26 NOTE — ED Notes (Signed)
Caregiver at bedside

## 2019-12-26 NOTE — ED Notes (Signed)
Pt assisted with urinal

## 2019-12-26 NOTE — TOC Initial Note (Signed)
Transition of Care [facility]) - Initial/Assessment Note    Patient Details  Name: Aaron Gibbs MRN: [id number] Date of Birth: 02-Feb-1935  Transition of Care [facility]) CM/SW Contact:    Anselm Pancoast, RN Phone Number: 12/26/2019, 10:30 AM  Clinical Narrative:                 Spoke to daughter, [name] on the telephone. Confirmed patient was at [facility] for rehab and family was paying for 24 hour a day which [place] did not allow and demanded to leave. [name] states patient had fallen at facility and was left on the floor for an extended amount of time and that was the reason for the extra caregivers. [name] has been talking with [facility] about possible placement as well as potential programs for 24/7 homecare. Daughter confirmed patient is visually impaired and this often leads to increased anxiety and fearfulness in new settings. Patient is continent of bowel and bladder with assistance. RN CM spoke with patient at bedside who was anxious and attempting to get out of bed. Patient states he feels as if he can't breathe due to congestion in his nose. RN CM will attempt to locate new SNF for continued rehab.    Expected Discharge Plan: Skilled Nursing Facility Barriers to Discharge: SNF Pending bed offer   Patient Goals and CMS Choice Patient states their goals for this hospitalization and ongoing recovery are:: To discharge to a new rehab facility CMS Medicare.gov Compare Post Acute Care list provided to:: Patient Represenative (must comment) Choice offered to / list presented to : Adult Children  Expected Discharge Plan and Services Expected Discharge Plan: [place]   Discharge Planning Services: CM Consult Post Acute Care Choice: [place] Living arrangements for the past 2 months: Apartment, [place]                                      Prior Living Arrangements/Services Living arrangements for the past 2 months:  [place] Lives with:: Facility Resident Patient language and need for interpreter reviewed:: Yes Do you feel safe going back to the place where you live?: No   seeking new facility  Need for Family Participation in Patient Care: Yes (Comment) Care giver support system in place?: Yes (comment) Current home services: DME (walker, wheelchair, [place] chair) Criminal Activity/Legal Involvement Pertinent to Current Situation/Hospitalization: No - Comment as needed  Activities of Daily Living      Permission Sought/Granted Permission sought to share information with : Family Supports Permission granted to share information with : Yes, Verbal Permission Granted  Share Information with NAME: Lisa-Daughter        Permission granted to share info w Contact Information: [phone]  Emotional Assessment Appearance:: Appears stated age Attitude/Demeanor/Rapport: Crying, Guarded, Complaining, Inconsistent, Uncooperative Affect (typically observed): Anxious, Guarded, Frustrated, Restless Orientation: : Oriented to Self, Oriented to Place, Oriented to Situation (Dtr states baseline is alert and oriented x4) Alcohol / Substance Use: Never Used Psych Involvement: No (comment)  Admission diagnosis:  Anxiety Patient Active Problem List   Diagnosis Date Noted  . Acute kidney failure ([place]) 12/14/2019  . AKI (acute kidney injury) ([place]) 12/13/2019  . Depression with anxiety 12/13/2019  . Depression 12/12/2019  . Aspiration pneumonia ([place]) 09/25/2019  . COPD (chronic obstructive pulmonary disease) ([place])   . HLD (hyperlipidemia)   . Iron deficiency anemia   .  Fall at home, initial encounter   . Medial meniscal tear, sequela (Left) 06/10/2019  . Patellar tendinosis (Right) 06/10/2019  . Acute respiratory failure with hypoxia ([place]) 04/09/2019  . Community acquired pneumonia 03/29/2019  . Lateral meniscal tear, sequela (Left) 03/03/2019  . Palpitation 02/13/2019  . Preop  testing 11/14/2018  . Noncompliance with medication treatment due to overuse of medication 10/23/2018  . Neurogenic pain 08/20/2018  . Atherosclerotic peripheral vascular disease ([place]) 07/24/2018  . Tricompartment osteoarthritis of knee (Left) 07/24/2018  . Osteoarthritis of knee (Bilateral) 07/24/2018  . Osteoarthritis of patellofemoral joint (Right) 07/24/2018  . History of suicide attempt (06/12/18) 07/24/2018  . Long term current use of opiate analgesic 07/10/2018  . Pharmacologic therapy 07/10/2018  . Disorder of skeletal system 07/10/2018  . Problems influencing health status 07/10/2018  . Suicide attempt ([place]) (05/13/18) 06/26/2018  . Acute metabolic encephalopathy 28/31/5176  . 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 Major depressive disorder, recurrent episode, severe ([place]) 06/04/2018  . Blindness 05/10/2018  . Suicidal ideation 05/10/2018  . Chronic pain syndrome 05/01/2018  . DISH (diffuse idiopathic skeletal hyperostosis) 05/01/2018  . Osteoarthritis of multiple joints 05/01/2018  . Chronic knee pain (Primary Area of Pain) (Bilateral) (L>R) 05/01/2018  . Retinitis pigmentosa of both eyes 05/01/2018  . Glaucoma of both eyes 05/01/2018  . Essential hypertension 05/01/2018  . Chronic low back pain (Bilateral) w/o sciatica 05/01/2018  . GERD (gastroesophageal reflux disease) 05/01/2018  . 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 Therapeutic opioid-induced constipation (OIC) 05/01/2018  . Centrilobular emphysema ([place]) 05/01/2018   PCP:  Olin Hauser, DO Pharmacy:   [facility], [place] [address] Phone: [phone] Fax: [phone]     Social Determinants of Health (SDOH) Interventions    Readmission Risk Interventions Readmission Risk Prevention Plan 12/16/2019  Transportation Screening Complete  PCP or Specialist Appt within 3-5 Days Complete  HRI or [place] Complete   Social Work Consult for [place] Planning/Counseling Complete  Palliative Care Screening Not Applicable  Medication Review (RN Care Manager) Referral to Pharmacy

## 2019-12-26 NOTE — ED Notes (Addendum)
Pt calling out "help" this RN responds, Latricia, EDT at bedside, pt wants bed adjusted, pt c/o of lower back pain, pt adjusted to left side lying, pt counseled on appropriate times to scream help   Pt reports 3 x daily oxycodone use, this RN unable to verify this med in Epic,  Dr Jari Pigg notified

## 2019-12-26 NOTE — ED Notes (Signed)
Patient given snack and milk.

## 2019-12-26 NOTE — ED Notes (Addendum)
Aaron Gibbs, EDT reports pt ate meal tray and drank milk, pt toileted and was able to say urinal in bed needed

## 2019-12-26 NOTE — ED Notes (Signed)
Pt taken to CT.

## 2019-12-26 NOTE — ED Notes (Signed)
Sitter remains in room.

## 2019-12-26 NOTE — ED Notes (Signed)
Pt unlabored, NAD.  sats WNL per sitter when checked.  Waiting on social work.

## 2019-12-26 NOTE — ED Notes (Signed)
Pt restless, actively trying to get out of bed.

## 2019-12-26 NOTE — ED Notes (Signed)
Pt readjust in bed

## 2019-12-26 NOTE — ED Notes (Signed)
Daughter's phone number [phone]

## 2019-12-26 NOTE — ED Notes (Signed)
Pt moved into hospital bed. Pt resting at this time.

## 2019-12-26 NOTE — TOC Progression Note (Signed)
Transition of Care [facility]) - Progression Note    Patient Details  Name: Aaron Gibbs MRN: [id number] Date of Birth: 1934-12-03  Transition of Care [facility]) CM/SW Contact  Anselm Pancoast, RN Phone Number: 12/26/2019, 3:33 PM  Clinical Narrative:       Expected Discharge Plan: Skilled Nursing Facility Barriers to Discharge: SNF Pending bed offer  Expected Discharge Plan and Services Expected Discharge Plan: [place]   Discharge Planning Services: CM Consult Post Acute Care Choice: [place] Living arrangements for the past 2 months: Apartment, [place]                                       Social Determinants of Health (SDOH) Interventions    Readmission Risk Interventions Readmission Risk Prevention Plan 12/16/2019  Transportation Screening Complete  PCP or Specialist Appt within 3-5 Days Complete  HRI or [place] Complete  Social Work Consult for [place] Planning/Counseling Complete  Palliative Care Screening Not Applicable  Medication Review Press photographer) Referral to Pharmacy

## 2019-12-26 NOTE — ED Triage Notes (Signed)
Pt arrived to ED via EMS from [facility] with c/o frquent urination and anxiety for the past 2 days. Pt alert and calm at this time. Denies urinary symptoms or pain.

## 2019-12-26 NOTE — ED Notes (Signed)
Pt now yelling "I can't breath, help!"

## 2019-12-26 NOTE — Evaluation (Addendum)
Physical Therapy Evaluation Patient Details Name: Aaron Gibbs MRN: [id number] DOB: June 19, 1935 Today's Date: 12/26/2019   History of Present Illness  Patient has a PMH of AKI, depression with anxiety, COPD, HLD, PVD, OA, suicide attempt, blindness, DISH, chronic pain syndrome, GERD, emphysema. Three ED visits in <4 weeks. Came from [facility] via EMS due to urinary frequency, upon questioning per physician notes requests new SNF placement due to level of care at current facility.  Clinical Impression  Patient is a 85 year old male with confusion and agitation. He is in bed with a sitter present upon PT arrival. Is currently from [facility] but desires to transition to a different SNF per patient and previous medical documentation. Patient is agreeable to transition from bed to chair due to back pain, requires mod A x2 person assist for all bed mobility and transfers with max cueing for sequencing. Upon reaching chair patient becomes agitated and reports increased pain, attempted changing angles of chair and legs for optimal comfort with patient increasing in agitation. Returned patient to bed with x 3 assist. Upon returning to bed patient reports additional discomfort, attempted rolling x 2 assist, partial roll, etc without success for pain relief. Sitter requesting hospital bed. Due to patients high level of assist required for all mobility he is a high fall risk and at risk for decline if not discharged to a SNF. While in the hospital patient would benefit from skilled physical therapy to increase strength, stability, and decrease fall risk. Upon discharge patient will require placement in SNF to reduce fall risk and improve quality of life.     Follow Up Recommendations SNF    Equipment Recommendations  Rolling walker with 5" wheels    Recommendations for Other Services       Precautions / Restrictions Precautions Precautions: Fall Restrictions Weight Bearing Restrictions: No       Mobility  Bed Mobility Overal bed mobility: Needs Assistance Bed Mobility: Sit to Supine;Supine to Sit;Rolling Rolling: Mod assist;+2 for physical assistance;+2 for safety/equipment   Supine to sit: Mod assist;+2 for physical assistance;+2 for safety/equipment;HOB elevated Sit to supine: Mod assist;+2 for physical assistance;+2 for safety/equipment   General bed mobility comments: Patient requires assistance x 2 person for all bed mobility.  Transfers Overall transfer level: Needs assistance Equipment used: Rolling walker (2 wheeled) Transfers: Sit to/from [facility] Sit to Stand: Mod assist;Max assist;+2 physical assistance Stand pivot transfers: Min assist;+2 physical assistance       General transfer comment: Requires assistance placing hands on RW. Needs x2 person assist with heavy assistance for lower portion of sit to stand as well as max cueing for sequencing for stand pivot transfer.  Ambulation/Gait Ambulation/Gait assistance: Mod assist;+2 physical assistance;+2 safety/equipment Gait Distance (Feet): 4 Feet Assistive device: Rolling walker (2 wheeled)       General Gait Details: step to , max verbal cueing required for sequencing, one near LOB requiring PT to return patient to COM.  Stairs            Wheelchair Mobility    Modified Rankin (Stroke Patients Only)       Balance Overall balance assessment: Needs assistance Sitting-balance support: Bilateral upper extremity supported;Feet supported Sitting balance-Leahy Scale: Fair Sitting balance - Comments: requires close guarding/min A ; posterior LOB Postural control: Posterior lean Standing balance support: Bilateral upper extremity supported Standing balance-Leahy Scale: Poor Standing balance comment: requires assistance to retain standing position  Pertinent Vitals/Pain Pain Assessment: Faces Faces Pain Scale: Hurts whole lot Pain  Location: back Pain Descriptors / Indicators: Crushing;Contraction Pain Intervention(s): Limited activity within patient's tolerance;Monitored during session;Repositioned    Home Living Family/patient expects to be discharged to:: Skilled nursing facility                 Additional Comments: Patient comes to ED from [facility], reports he desires a different facility due to recent fall.    Prior Function Level of Independence: Needs assistance   Gait / Transfers Assistance Needed: Patient at [facility], frequent falls with RW  ADL's / Homemaking Assistance Needed: requires assistance with bathing, dressing, iADLs  Comments: Limited visual field. Patient confused and tangential with frequent angry outbursts.     Hand Dominance   Dominant Hand: Right    Extremity/Trunk Assessment   Upper Extremity Assessment Upper Extremity Assessment: Generalized weakness    Lower Extremity Assessment Lower Extremity Assessment: Generalized weakness;Difficult to assess due to impaired cognition (limited due to cognition and patient agitation; grossly 3/5 for transfer strength)    Cervical / Trunk Assessment Cervical / Trunk Assessment: Kyphotic  Communication   Communication: HOH  Cognition Arousal/Alertness: Lethargic Behavior During Therapy: Agitated Overall Cognitive Status: History of cognitive impairments - at baseline                                 General Comments: Patient requires sitter, intermittent angry/agitated outbursts.      General Comments      Exercises Other Exercises Other Exercises: Patient educated on role of PT in acute care setting, safe mobility and transfers, and bed mobility. Patient is unable to be positioned for pain control resulting in > 5 attempts at different positions (sitting, reclined, supine, sidelie, partial sidelie)   Assessment/Plan    PT Assessment Patient needs continued PT services  PT Problem  List Decreased strength;Decreased mobility;Decreased safety awareness;Decreased range of motion;Decreased knowledge of precautions;Decreased activity tolerance;Decreased cognition;Cardiopulmonary status limiting activity;Decreased balance;Decreased knowledge of use of DME;Obesity;Pain       PT Treatment Interventions DME instruction;Gait training;Stair training;Functional mobility training;Therapeutic activities;Therapeutic exercise;Manual techniques;Wheelchair mobility training;Neuromuscular re-education;Balance training    PT Goals (Current goals can be found in the Care Plan section)  Acute Rehab PT Goals Patient Stated Goal: to go to a different SNF PT Goal Formulation: With patient Time For Goal Achievement: 01/09/20 Potential to Achieve Goals: Fair    Frequency Min 2X/week   Barriers to discharge Inaccessible home environment;Decreased caregiver support requires SNF    Co-evaluation               AM-PAC PT "6 Clicks" Mobility  Outcome Measure Help needed turning from your back to your side while in a flat bed without using bedrails?: A Lot Help needed moving from lying on your back to sitting on the side of a flat bed without using bedrails?: A Lot Help needed moving to and from a bed to a chair (including a wheelchair)?: A Lot Help needed standing up from a chair using your arms (e.g., wheelchair or bedside chair)?: A Lot Help needed to walk in hospital room?: A Lot Help needed climbing 3-5 steps with a railing? : Total 6 Click Score: 11    End of Session Equipment Utilized During Treatment: Gait belt Activity Tolerance: Patient limited by fatigue;Patient limited by pain Patient left: in bed;with bed alarm set;with nursing/sitter in room Nurse Communication: Mobility status  PT Visit Diagnosis: Difficulty in walking, not elsewhere classified (R26.2);Muscle weakness (generalized) (M62.81);Other abnormalities of gait and mobility (R26.89);Unsteadiness on feet  (R26.81);Repeated falls (R29.6);Pain Pain - part of body:  (back)    Time: 7001-7494 PT Time Calculation (min) (ACUTE ONLY): 28 min   Charges:   PT Evaluation $PT Eval Moderate Complexity: 1 Mod PT Treatments $Therapeutic Activity: 8-22 mins       Janna Arch, PT, DPT   12/26/2019, 1:38 PM

## 2019-12-26 NOTE — ED Notes (Signed)
This tech and PT assisted to pt to chair. Pt required a walker to stand and pivot. Pt was still uncomfortable to pt was placed back into stretcher due to back pain.

## 2019-12-26 NOTE — ED Notes (Signed)
Pt complaining that "bed railing is restricting his breathing."

## 2019-12-26 NOTE — ED Notes (Signed)
Fresh brief put on pt.

## 2019-12-26 NOTE — ED Notes (Signed)
Pt cleaned up and repositioned in bed. Pt placed in clean brief 
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
## 2019-12-26 NOTE — ED Notes (Signed)
Order placed for hospital bed for pt at this time.

## 2019-12-26 NOTE — ED Notes (Signed)
Pt returned to room from CT. This tech still setting with pt.

## 2019-12-26 NOTE — NC FL2 (Signed)
[place] LEVEL OF CARE SCREENING TOOL     IDENTIFICATION  Patient Name: Aaron Gibbs Birthdate: 1935/02/27 Sex: male Admission Date (Current Location): 12/26/2019  [place] and [place] Number:  Engineering geologist and Address:  [facility], [address], [place], [place] [address]      Provider Number: [id number]  Attending Physician Name and Address:  No att. providers found  Relative Name and Phone Number:  [name] [phone]    Current Level of Care: SNF Recommended Level of Care: [place] Prior Approval Number:    Date Approved/Denied:   PASRR Number: [id number]  Discharge Plan: SNF    Current Diagnoses: Patient Active Problem List   Diagnosis Date Noted  . Acute kidney failure ([place]) 12/14/2019  . AKI (acute kidney injury) ([place]) 12/13/2019  . Depression with anxiety 12/13/2019  . Depression 12/12/2019  . Aspiration pneumonia ([place]) 09/25/2019  . COPD (chronic obstructive pulmonary disease) ([place])   . HLD (hyperlipidemia)   . Iron deficiency anemia   . Fall at home, initial encounter   . Medial meniscal tear, sequela (Left) 06/10/2019  . Patellar tendinosis (Right) 06/10/2019  . Acute respiratory failure with hypoxia ([place]) 04/09/2019  . Community acquired pneumonia 03/29/2019  . Lateral meniscal tear, sequela (Left) 03/03/2019  . Palpitation 02/13/2019  . Preop testing 11/14/2018  . Noncompliance with medication treatment due to overuse of medication 10/23/2018  . Neurogenic pain 08/20/2018  . Atherosclerotic peripheral vascular disease ([place]) 07/24/2018  . Tricompartment osteoarthritis of knee (Left) 07/24/2018  . Osteoarthritis of knee (Bilateral) 07/24/2018  . Osteoarthritis of patellofemoral joint (Right) 07/24/2018  . History of suicide attempt (06/12/18) 07/24/2018  . Long term current use of opiate analgesic 07/10/2018  . Pharmacologic therapy 07/10/2018  . Disorder of skeletal system  07/10/2018  . Problems influencing health status 07/10/2018  . Suicide attempt ([place]) (05/13/18) 06/26/2018  . Acute metabolic encephalopathy 35/36/1443  . 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 Major depressive disorder, recurrent episode, severe ([place]) 06/04/2018  . Blindness 05/10/2018  . Suicidal ideation 05/10/2018  . Chronic pain syndrome 05/01/2018  . DISH (diffuse idiopathic skeletal hyperostosis) 05/01/2018  . Osteoarthritis of multiple joints 05/01/2018  . Chronic knee pain (Primary Area of Pain) (Bilateral) (L>R) 05/01/2018  . Retinitis pigmentosa of both eyes 05/01/2018  . Glaucoma of both eyes 05/01/2018  . Essential hypertension 05/01/2018  . Chronic low back pain (Bilateral) w/o sciatica 05/01/2018  . GERD (gastroesophageal reflux disease) 05/01/2018  . 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 Therapeutic opioid-induced constipation (OIC) 05/01/2018  . Centrilobular emphysema ([place]) 05/01/2018    Orientation RESPIRATION BLADDER Height & Weight     Self, Situation, Place  Normal Continent Weight: 113.4 kg Height:  5\' 11"  (180.3 cm)  BEHAVIORAL SYMPTOMS/MOOD NEUROLOGICAL BOWEL NUTRITION STATUS      Continent Diet  AMBULATORY STATUS COMMUNICATION OF NEEDS Skin   Extensive Assist Verbally Normal                       Personal Care Assistance Level of Assistance  Bathing, Feeding, Dressing Bathing Assistance: Limited assistance Feeding assistance: Limited assistance Dressing Assistance: Limited assistance     Functional Limitations Info  Sight, Hearing, Speech Sight Info: Impaired Hearing Info: Adequate Speech Info: Adequate    SPECIAL CARE FACTORS FREQUENCY  PT (By licensed PT), OT (By licensed OT)     PT Frequency: 5xweek OT Frequency: 5xweek  Contractures Contractures Info: Not present    Additional Factors Info                  Current Medications (12/26/2019):  This is the current hospital active medication  list Current Facility-Administered Medications  Medication Dose Route Frequency Provider Last Rate Last Admin  . albuterol (PROVENTIL) (2.5 MG/3ML) 0.083% nebulizer solution 2.5-5 mg  2.5-5 mg Inhalation Q6H PRN Duffy Bruce, MD      . amLODipine (NORVASC) tablet 10 mg  10 mg Oral Daily Duffy Bruce, MD   10 mg at 12/26/19 0841  . aspirin EC tablet 81 mg  81 mg Oral Daily Duffy Bruce, MD   81 mg at 12/26/19 0841  . cephALEXin (KEFLEX) capsule 500 mg  500 mg Oral Q8H Duffy Bruce, MD   500 mg at 12/26/19 1342  . DULoxetine (CYMBALTA) DR capsule 80 mg  80 mg Oral Daily Duffy Bruce, MD   80 mg at 12/26/19 0843  . fenofibrate tablet 160 mg  160 mg Oral Daily Duffy Bruce, MD   160 mg at 12/26/19 0845  . ferrous sulfate tablet 325 mg  325 mg Oral Q breakfast Duffy Bruce, MD   325 mg at 12/26/19 0842  . pantoprazole (PROTONIX) EC tablet 40 mg  40 mg Oral Daily Duffy Bruce, MD   40 mg at 12/26/19 0842  . risperiDONE (RISPERDAL) tablet 2 mg  2 mg Oral QHS Duffy Bruce, MD      . timolol (TIMOPTIC) 0.5 % ophthalmic solution 1 drop  1 drop Both Eyes BID Duffy Bruce, MD   1 drop at 12/26/19 1039  . traZODone (DESYREL) tablet 50 mg  50 mg Oral QHS Duffy Bruce, MD      . umeclidinium-vilanterol [facility] ELLIPTA) 62.5-25 MCG/INH 1 puff  1 puff Inhalation Daily Duffy Bruce, MD   1 puff at 12/26/19 1039   Current Outpatient Medications  Medication Sig Dispense Refill  . albuterol (VENTOLIN HFA) 108 (90 Base) MCG/ACT inhaler INHALE 1-2 PUFFS INTO THE LUNGS EVERY 6 HOURS AS NEEDED (Patient taking differently: Inhale 1-2 puffs into the lungs every 6 (six) hours as needed for wheezing or shortness of breath. ) 8.5 g 3  . amLODipine (NORVASC) 10 MG tablet Take 1 tablet (10 mg total) by mouth daily. 90 tablet 1  . aspirin EC 81 MG tablet Take 81 mg by mouth daily.    . diclofenac sodium (VOLTAREN) 1 % GEL Apply 2 g topically daily as needed.     . DULoxetine (CYMBALTA) 20 MG  capsule Take 4 capsules (80 mg total) by mouth daily. 120 capsule 1  . fenofibrate (TRICOR) 145 MG tablet Take 1 tablet (145 mg total) by mouth daily. (Patient taking differently: Take 145 mg by mouth at bedtime. ) 90 tablet 1  . ferrous sulfate 325 (65 FE) MG tablet Take 325 mg by mouth daily with breakfast.    . Multiple Vitamin (MULTIVITAMIN WITH MINERALS) TABS tablet Take 1 tablet by mouth daily.    . pantoprazole (PROTONIX) 40 MG tablet Take 40 mg by mouth daily.     . risperiDONE (RISPERDAL) 2 MG tablet Take 1 tablet (2 mg total) by mouth at bedtime. 30 tablet 1  . timolol (TIMOPTIC) 0.5 % ophthalmic solution Place 1 drop into both eyes 2 (two) times daily. Apply one drop to eye(s)    . traZODone (DESYREL) 50 MG tablet TAKE 1 TABLET BY MOUTH AT BEDTIME (Patient taking differently: Take 50 mg by mouth at  bedtime. ) 90 tablet 1  . umeclidinium-vilanterol (ANORO ELLIPTA) 62.5-25 MCG/INH AEPB Inhale 1 puff into the lungs daily.    . cephALEXin (KEFLEX) 500 MG capsule Take 1 capsule (500 mg total) by mouth 3 (three) times daily for 7 days. 21 capsule 0     Discharge Medications: Please see discharge summary for a list of discharge medications.  Relevant Imaging Results:  Relevant Lab Results:   Additional Information SS# [place] Shalandria Elsbernd, RN

## 2019-12-26 NOTE — ED Notes (Signed)
Pt assisted with urinal by EDT Kayla. Pt cleaned up and clean gown placed and brief. Pt given multiple blankets and positioned on left side with pillows. Pt resting comfortably. Sitter EDT Kayla at bedside

## 2019-12-26 NOTE — ED Notes (Signed)
Pt given some water.

## 2019-12-27 LAB — URINE CULTURE

## 2019-12-27 MED ORDER — ACETAMINOPHEN 325 MG PO TABS
ORAL_TABLET | ORAL | Status: AC
  Administered 2019-12-27: 650 mg via ORAL
  Filled 2019-12-27: qty 2

## 2019-12-27 MED ORDER — OXYCODONE HCL 5 MG PO TABS
10.0000 mg | ORAL_TABLET | Freq: Three times a day (TID) | ORAL | Status: DC | PRN
  Administered 2019-12-27 – 2019-12-29 (×8): 10 mg via ORAL
  Filled 2019-12-27 (×8): qty 2

## 2019-12-27 MED ORDER — ACETAMINOPHEN 325 MG PO TABS
650.0000 mg | ORAL_TABLET | Freq: Four times a day (QID) | ORAL | Status: DC | PRN
  Administered 2019-12-27 – 2019-12-28 (×2): 650 mg via ORAL
  Filled 2019-12-27 (×2): qty 2

## 2019-12-27 NOTE — ED Notes (Signed)
Sitter remains at bedside. Pt resting calmly in bed.

## 2019-12-27 NOTE — ED Notes (Signed)
excoriation noted at base of right cheek, meplelex in room for application

## 2019-12-27 NOTE — ED Notes (Signed)
This RN to bedside, pt continues to have 1:1 sitter at this time. Pt visualized in NAD, resting in hospital bed with lights dimmed. Will introduce self to patient when he is awake.

## 2019-12-27 NOTE — ED Notes (Signed)
Medication given per order. Pt assisted back to laying position by this RN. Per caregiver pt has become restless due to pain. Constantly changing positions between sitting up on side of bed and laying down. VS obtained by this RN. Pain medication given. Pt states "that's better".

## 2019-12-27 NOTE — ED Notes (Signed)
Pt turned on his right side in bed. Has thrown his sheets over to the left side. Safety sitter states pt has been resting calmly; pt attempting to sleep.

## 2019-12-27 NOTE — ED Notes (Signed)
This RN spoke with Theadora Rama, Agricultural consultant. Per Theadora Rama, okay to release staff sitter with patient due to patient's personal caregiver/sitter arriving, per Charge RN okay to have daughter visit with patient even with sitter at bedside. Screener made aware. Pt's caregiver is Merchant navy officer. Pt resting in bed at this time. NAD noted.

## 2019-12-27 NOTE — ED Notes (Signed)
Tylenol administered due to patient c/o pain. Pt repositioned in bed. Pt sitting on side of bed, this RN encouraged patient to continue to sit up until after lunch. Pt states understanding at this time. Caregiver remains at bedside at this time.

## 2019-12-27 NOTE — TOC Progression Note (Addendum)
Transition of Care [facility]) - Progression Note    Patient Details  Name: Aaron Gibbs MRN: [id number] Date of Birth: 02-24-1935  [facility]) CM/SW [place], LCSW Phone Number: 12/27/2019, 9:26 AM  Clinical Narrative:  [facility] bed offer made and accepted by family.  Social Worker spoke to the patient's daughter [name], [phone].   Next step: waiting for insurance authorization. Packet faxed out to [facility]. Telephone call placed to Tammy from [facility] [phone].   Expected Discharge Plan: Skilled Nursing Facility Barriers to Discharge: SNF Pending bed offer  Expected Discharge Plan and Services Expected Discharge Plan: [place]   Discharge Planning Services: CM Consult Post Acute Care Choice: [place] Living arrangements for the past 2 months: Apartment, [place]                                       Social Determinants of Health (SDOH) Interventions    Readmission Risk Interventions Readmission Risk Prevention Plan 12/16/2019  Transportation Screening Complete  PCP or Specialist Appt within 3-5 Days Complete  HRI or [place] Complete  Social Work Consult for [place] Planning/Counseling Complete  Palliative Care Screening Not Applicable  Medication Review Press photographer) Referral to Pharmacy

## 2019-12-27 NOTE — ED Notes (Signed)
Medication administered for pain at this time. Pt continues to have 1:1 sitter. Pt given breakfast tray at this time.

## 2019-12-27 NOTE — ED Notes (Addendum)
Pt still sleeping, even and unlabored respirations, NAD - caregiver, Neoma Laming at bedside eating, introduced self and call bell light, TV on, lights dimmed

## 2019-12-27 NOTE — ED Notes (Signed)
Pt will not answer whether he is in pain or not.

## 2019-12-27 NOTE — ED Notes (Addendum)
Pt cleaned of stool, minor movement, with caregiver assist, bed cleaned and new sheets, diaper and chux applied, EVS called for room, call bell placed, room temp adjusted, additional cleaning supplies provided  Blue recliner in room for care giver to sleep in

## 2019-12-27 NOTE — ED Notes (Signed)
This RN and Neoma Laming, Caregiver sat patient up on side of bed. Pt able to stand with 2 person assist. Clean brief placed on patient at this time. EDP made aware that patient's HR increased with position changes, no new orders. Pt back to bed and repositioned onto L side from R side.

## 2019-12-27 NOTE — ED Notes (Signed)
NT Sitter agreed to complete vitals soon.

## 2019-12-27 NOTE — ED Notes (Signed)
Sitter remains at bedside. States pt has been cooperative with redirection when needed.

## 2019-12-27 NOTE — ED Notes (Signed)
This RN to bedside, introduced self to patient, pt continues to have 1:1 sitter. Pt able to take PO medications with water. Pt c/o back pain 9/10 at this time from a fall. Pt requesting pain medication, states normally takes Oxycodone for pain, EDP made aware, states will review med rec and order as appropriate. Pt alert, oriented to self, year, place, and situation.

## 2019-12-27 NOTE — ED Notes (Signed)
This RN to bedside, explained to patient and caregiver that this RN spoke with EDP regarding patient's pain depsite Oxycodone and Tylenol. Per EDP continue to administer oxycodone q8 as ordered. Pt resting in bed on R side, caregiver remains at bedside at this time.

## 2019-12-28 MED ORDER — TRAZODONE HCL 50 MG PO TABS
150.0000 mg | ORAL_TABLET | Freq: Every day | ORAL | Status: DC
  Administered 2019-12-28: 150 mg via ORAL

## 2019-12-28 NOTE — ED Notes (Addendum)
Pt reports I can't breathe, pt breathing effort relaxed and easy, pt repositioned as requested now reports "better"   Pt declines breathing treatment

## 2019-12-28 NOTE — ED Notes (Addendum)
Pt attempting to get out of bed and insisting on laying on floor. Pt back in bed with assistance of four people. Requested sitter from [employer], charge states there is no resources for sitter for the shift. Temporary sitter in room. EDP informed, order for trazodone received. Brief and linens changed- stage one bed sore noted on left buttock.

## 2019-12-28 NOTE — ED Notes (Signed)
Active range of motion exercise performed with pt at this time focusing on leg strength.

## 2019-12-28 NOTE — ED Notes (Signed)
Pt repositioned to forward position. Meal offered, pt declines.

## 2019-12-28 NOTE — ED Notes (Addendum)
Pt turned to leftt side.

## 2019-12-28 NOTE — ED Notes (Signed)
Changed pt's brief, small amount of dark stool and urine noted. Pt turned to left side.

## 2019-12-28 NOTE — ED Notes (Signed)
Pt sliding down in bed. Brief is dry. Pt moved up in bed and repositioned to right side.

## 2019-12-28 NOTE — ED Notes (Signed)
Pt stood up twice with minimal assistance using walker and took several steps to the side. Pt still refuses offer to eat at this time. Pt assisted back into bed.

## 2019-12-28 NOTE — ED Notes (Signed)
additional blanket provided, pt very thirsty, pt adjusted to sleep on right side and propped with pillow

## 2019-12-28 NOTE — ED Notes (Signed)
Pt having diarrhea, caregiver at bedside reports no longer able to assist, cleaned of stool with this RN and Maudie Mercury EDT  Pt left lower glut near fold excoriated, skin appears healing no need to meplex

## 2019-12-28 NOTE — ED Notes (Signed)
Attempted to call daughter- no answer. Please note that family's private sitter has not been present since 11 pm.

## 2019-12-28 NOTE — ED Notes (Signed)
Daughter at bedside. Stated she can only stay an hour d/t having to work. States paid sitter will be back at 3pm. Informed daughter to let this RN know when she leaves.

## 2019-12-28 NOTE — ED Notes (Signed)
Pt attempting to roll out of bed and yelling "I'm falling out of bed, help." Pt repositioned to right side per his request. Toileting offered, pt unable to urinate.

## 2019-12-28 NOTE — ED Notes (Addendum)
Assumed care at this time. No sitter present in room. Pt is attempting to get out of bed. Pt pulled up in bed and repositioned. Pt c/o back pain. Brief is clean and dry.

## 2019-12-28 NOTE — ED Notes (Signed)
Pt states "I want to be pulled," but is unable to find position of comfort despite multiple repositioning. Pt now yelling for help.

## 2019-12-28 NOTE — ED Notes (Signed)
Pt sleeping with even and unlabored respirations, caregiver at bedside in recliner reports pt has been sleeping since meds last given

## 2019-12-28 NOTE — ED Notes (Signed)
Brief and gown changed, pt cleaned and given water per his request.

## 2019-12-28 NOTE — TOC Progression Note (Signed)
Transition of Care [facility]) - Progression Note    Patient Details  Name: Jorgen Wolfinger MRN: [id number] Date of Birth: Sep 13, 1934  Transition of Care [facility]) CM/SW Wade, LCSW Phone Number: 12/28/2019, 1:13 PM  Clinical Narrative:   Insurance authorization status is pending as of 12/28/2019 -  4035.   1116 - [facility] bed offer made and accepted by family.  Social Worker spoke to the patient's daughter Arsal Tappan, [phone].  Next step: waiting for insurance authorization. Packet faxed out to [facility]. Telephone call placed to Tammy from [facility] [phone].   Expected Discharge Plan: Skilled Nursing Facility Barriers to Discharge: SNF Pending bed offer  Expected Discharge Plan and Services Expected Discharge Plan: [place]   Discharge Planning Services: CM Consult Post Acute Care Choice: [place] Living arrangements for the past 2 months: Apartment, [place]                                       Social Determinants of Health (SDOH) Interventions    Readmission Risk Interventions Readmission Risk Prevention Plan 12/16/2019  Transportation Screening Complete  PCP or Specialist Appt within 3-5 Days Complete  HRI or [place] Complete  Social Work Consult for [place] Planning/Counseling Complete  Palliative Care Screening Not Applicable  Medication Review [employer] photographer) Referral to Pharmacy

## 2019-12-29 MED ORDER — CEPHALEXIN 500 MG PO CAPS: 500.0000 mg | ORAL_CAPSULE | Freq: Three times a day (TID) | ORAL | 0 refills | Status: AC

## 2019-12-29 NOTE — ED Notes (Addendum)
Report called and given to Juliann Pulse, Therapist, sports At [facility].

## 2019-12-29 NOTE — ED Notes (Addendum)
This RN at bedside. Pt assisted from hospital bed to recliner by this RN and EDT. Pt utilized walker and 2 assist by staff. Pt provided with warm blankets and coffee. Pt's brief remains clean and dry at this time. Pt stating he would like his pain medicine. Pt denies any further needs at this time. Pt remains with 1:1 sitter at this time

## 2019-12-29 NOTE — ED Notes (Signed)
I helped patient void in urinal. Patient voided 139mls of tea colored urine.

## 2019-12-29 NOTE — ED Provider Notes (Signed)
Patient being discharged to [facility].  Continuation of cephalexin ordered.  Patient alert, understands plan.  Agreeable with discharge to continue care at [facility].   Return treatment recommendations discussed with the patient who is agreeable with the plan.    Delman Kitten, MD 12/29/19 1520

## 2019-12-29 NOTE — ED Notes (Signed)
Patient complains of being very uncomfortable in bed. Tried to turn patient on back, left side and right side. Pt still complains. Will yell out "help  Me" when he can get comfortable.AS

## 2019-12-29 NOTE — ED Notes (Signed)
PT's brief changed, noted urine and small amount of BM at this time.  Pt repositioned for comfort, bed linens clean and dry.  Safety sitter in place at this time.

## 2019-12-29 NOTE — TOC Transition Note (Signed)
Transition of Care [facility]) - CM/SW Discharge Note   Patient Details  Name: Aaron Gibbs MRN: [id number] Date of Birth: Apr 04, 1935  Transition of Care [facility]) CM/SW Contact:  Ova Freshwater Phone Number: [phone] 12/29/2019, 2:04 PM   Clinical Narrative:     Patient has insurance auth to d/c to [facility] room#611, report# [phone]. Insurance Auth REF# [id number], case manager Darnelle Bos, [phone] (fax), approval 7/12-7/14, [facility] Stabs from [facility] has been notified with insurance auth information. EDP/ED Staff have been notified about placement. Patient's daughter Saber Dickerman [phone], updated on d/c.  Final next level of care: Skilled Nursing Facility Barriers to Discharge: SNF Pending bed offer   Patient Goals and CMS Choice Patient states their goals for this hospitalization and ongoing recovery are:: To discharge to a new rehab facility CMS Medicare.gov Compare Post Acute Care list provided to:: Patient Represenative (must comment) Choice offered to / list presented to : Adult Children  Discharge Placement                       Discharge Plan and Services   Discharge Planning Services: CM Consult Post Acute Care Choice: Skilled Nursing Facility                               Social Determinants of Health (SDOH) Interventions     Readmission Risk Interventions Readmission Risk Prevention Plan 12/16/2019  Transportation Screening Complete  PCP or Specialist Appt within 3-5 Days Complete  HRI or [place] Complete  Social Work Consult for [place] Planning/Counseling Complete  Palliative Care Screening Not Applicable  Medication Review Press photographer) Referral to Pharmacy

## 2019-12-29 NOTE — ED Notes (Signed)
[facility]  CALLED  FOR  TRANSPORT  TO  [facility] 

## 2019-12-29 NOTE — ED Provider Notes (Signed)
waiting forinsurance authorization   Vitals:   12/29/19 0514 12/29/19 0650  BP: 114/64 (!) 106/58  Pulse: 93 88  Resp: 20 16  Temp:  97.8 F (36.6 C)  SpO2: 94% 98%     Patient is alert, laying on his side.  Reports he did not eat much breakfast as he is uncomfortable was not able to sit up well.  Uses walker with assistance, have asked nurse and intact to assist the patient to recliner which is in the room and to ensure he has more comfortable lunch.  He is waiting for insurance authorization.  Appears in no acute distress at this time. Does report that staying in the ER has not been a comfortable experience for him, will try to improve his experience   Delman Kitten, MD 12/29/19 1121

## 2019-12-29 NOTE — ED Notes (Signed)
Sitting with patient. Patient is sleeping. O2 level is 96.

## 2019-12-29 NOTE — ED Notes (Signed)
Patient had BM loose stool, changed soiled brief and bedding with assistance from [place] and [name].AS

## 2019-12-29 NOTE — ED Notes (Signed)
Pt attempting to exit bed. Pt yelling that he could not breathe. Pt provided PRN nebulizer and placed on monitor to ensure improvement and stability of VS. Pt tolerating aerosol mask well at this time.

## 2019-12-29 NOTE — ED Notes (Signed)
Pt unable to sign for discharge due to visual impairment

## 2019-12-29 NOTE — ED Notes (Signed)
Tech Alissa relieved me.

## 2019-12-30 NOTE — Progress Notes (Signed)
PROVIDER NOTE: Information contained herein reflects review and annotations entered in association with encounter. Interpretation of such information and data should be left to medically-trained personnel. Information provided to patient can be located elsewhere in the medical record under "Patient Instructions". Document created using STT-dictation technology, any transcriptional errors that may result from process are unintentional.    Patient: Aaron Gibbs  Service Category: E/M  Provider: Gaspar Cola, MD  DOB: January 11, 1935  DOS: 83/14/2021  Specialty: Interventional Pain Management  MRN: [id number]  Setting: Ambulatory outpatient  PCP: Aaron Hauser, DO  Type: Established Patient    Referring Provider: Nobie Gibbs *  Location: Office  Delivery: Face-to-face     HPI  Reason for encounter: Aaron Gibbs, a 85 y.o. year old male, is here today for evaluation and management of his Chronic pain syndrome [G89.4]. Aaron Gibbs primary complain today is Knee Pain (bilateral) Last encounter: Practice (10/13/2019). My last encounter with him was on 08/28/2019. Pertinent problems: Aaron Gibbs has Chronic pain syndrome; DISH (diffuse idiopathic skeletal hyperostosis); Osteoarthritis of multiple joints; Chronic knee pain (Primary Area of Pain) (Bilateral) (L>R); Chronic low back pain (Bilateral) w/o sciatica; Somatic symptom disorder; Tricompartment osteoarthritis of knee (Left); Osteoarthritis of knee (Bilateral); Osteoarthritis of patellofemoral joint (Right); Neurogenic pain; Lateral meniscal tear, sequela (Left); Medial meniscal tear, sequela (Left); and Patellar tendinosis (Right) on their pertinent problem list. Pain Assessment: Severity of Chronic pain is reported as a 9 /10. Location: Knee Right, Left/deneis. Quality: Sharp. Timing: Constant. Modifying factor(s): Oxycodone. Vitals:  height is '5\' 10"'  (1.778 m) and weight is 230 lb (104.3 kg). His temporal temperature  is 97 F (36.1 C) (abnormal). His blood pressure is 124/76 and his pulse is 98. His respiration is 14 and oxygen saturation is 97%.   Today the patient returns to the clinic with his caregiver on a wheelchair for follow-up evaluation.  According to the patient, he is really not having any side effects or problems with the oxycodone.  However, review of the patient's chart reveals that he has been to the emergency room and admitted a couple times secondary to suicidal ideations.  A long time ago I had a conversation with this patient and I informed them that I did not really feel that comfortable prescribing opioids for anybody that had attempted suicide, but he pushed the issue and because he does have they indications for the use of the opioids, I decided to given the opportunity to provide himself.  Unfortunately, that has not been the case and we have already had issues with him taking more medication than prescribed as well has hospital admissions due to encephalopathies as well as aspiration pneumonias, all of which could indicate and it is highly suspicious for the possibility that the patient is in fact taking more medication than prescribed at certain times, leading to those events.  Because of this, today I have communicated to the patient that I will no longer be continuing to prescribe the oxycodone and that the best that I can do for him is to switch him to the Butrans patches at a rate of 10 mcg/h q. 7 days.  Today I have provided him with a prescription to try this medication for approximately 4 weeks.  I will see him back in a couple weeks to see how he is doing on the medication and make the necessary adjustments.  However, the medications much difficult to abuse 10 pills since it is a patch.  Even if he was to  abuse it in the says that he would not follow the instructions and started putting more than 1 patch on him, at a time, it is very likely that his caregiver would probably noticed a  discrepancy in the amount of patches and remove those before they could be any kind of problem.  All of this was communicated to the patient and his caregiver, both of whom understood.  Today the patient reminded me that his last radiofrequency ablation, which he refers did help his pain, was done on 06/10/2019.  Indicates that he would like this to be repeated.  I think that this is appropriate and therefore I have put an order to take care of that.  He also pointed out that he is primary pain when it comes to the knees is that of the right side and this is where we will start.  Pharmacotherapy Assessment   Analgesic: Oxycodone IR 10 mg, 1 tab PO q 8 hrs (30 mg/day of oxycodone) (has enough to last until 02/24/2019) MME/day: 45 mg/day.   Monitoring: Aaron Gibbs PMP: PDMP reviewed during this encounter.       Pharmacotherapy: No side-effects or adverse reactions reported. Compliance: No problems identified. Effectiveness: Clinically acceptable.  Aaron Martins, RN  12/31/2019 12:58 PM  Sign when Signing Visit Nursing Pain Medication Assessment:  Safety precautions to be maintained throughout the outpatient stay will include: orient to surroundings, keep bed in low position, maintain call bell within reach at all times, provide assistance with transfer out of bed and ambulation.  Medication Inspection Compliance: [name] with our request to bring his pills to be counted. He was reminded that bringing the medication bottles, even when empty, is a requirement.  Medication: None brought in. Pill/Patch Count: None available to be counted. Bottle Appearance: No container available. Did not bring bottle(s) to appointment. Filled Date: N/A Last Medication intake:  Today    UDS:  Summary  Date Value Ref Range Status  07/10/2018 FINAL  Final    Comment:    ==================================================================== [facility] COMP DRUG  ANALYSIS,UR ==================================================================== Test                             Result       Flag       Units Drug Present and Declared for Prescription Verification   Morphine                       >41324       EXPECTED   ng/mg creat   Normorphine                    221          EXPECTED   ng/mg creat    Potential sources of large amounts of morphine in the absence of    codeine include administration of morphine or use of heroin.    Normorphine is an expected metabolite of morphine.   Hydromorphone                  88           EXPECTED   ng/mg creat    Hydromorphone may be present as a metabolite of morphine;    concentrations of hydromorphone rarely exceed 5% of the morphine    concentration when this is the source of hydromorphone.   Oxycodone  1094         EXPECTED   ng/mg creat   Oxymorphone                    1028         EXPECTED   ng/mg creat   Noroxycodone                   3205         EXPECTED   ng/mg creat   Noroxymorphone                 323          EXPECTED   ng/mg creat    Sources of oxycodone are scheduled prescription medications.    Oxymorphone, noroxycodone, and noroxymorphone are expected    metabolites of oxycodone. Oxymorphone is also available as a    scheduled prescription medication.   Duloxetine                     PRESENT      EXPECTED   Quetiapine                     PRESENT      EXPECTED   Acetaminophen                  PRESENT      EXPECTED Drug Present not Declared for Prescription Verification   Chlorpheniramine               PRESENT      UNEXPECTED   Hydroxyzine                    PRESENT      UNEXPECTED Drug Absent but Declared for Prescription Verification   Pregabalin                     Not Detected UNEXPECTED   Salicylate                     Not Detected UNEXPECTED    Aspirin, as indicated in the declared medication list, is not    always detected even when used as  directed. ==================================================================== Test                      Result    Flag   Units      Ref Range   Creatinine              94               mg/dL      >=20 ==================================================================== Declared Medications:  The flagging and interpretation on this report are based on the  following declared medications.  Unexpected results may arise from  inaccuracies in the declared medications.  **Note: The testing scope of this panel includes these medications:  Duloxetine  Morphine (Morphine Sulfate)  Oxycodone (Oxycodone Acetaminophen)  Pregabalin  Quetiapine  **Note: The testing scope of this panel does not include small to  moderate amounts of these reported medications:  Acetaminophen (Oxycodone Acetaminophen)  Aspirin  **Note: The testing scope of this panel does not include following  reported medications:  Amlodipine (Norvasc)  Doxycycline  Fenofibrate  Iron (Ferrous Sulfate)  Meloxicam  Multivitamin  Pantoprazole (Protonix)  Polyethylene Glycol  Prednisolone  Sennosides  Timolol  Umeclidinium (Anoro Ellipta)  Vilanterol (Anoro Ellipta) ==================================================================== For clinical consultation, please call [phone]. ====================================================================      ROS  Constitutional: Denies any fever or chills Gastrointestinal: No reported hemesis, hematochezia, vomiting, or acute GI distress Musculoskeletal: Denies any acute onset joint swelling, redness, loss of ROM, or weakness Neurological: No reported episodes of acute onset apraxia, aphasia, dysarthria, agnosia, amnesia, paralysis, loss of coordination, or loss of consciousness  Medication Review  DULoxetine, albuterol, amLODipine, aspirin EC, buprenorphine, cephALEXin, diclofenac sodium, fenofibrate, ferrous sulfate, multivitamin with minerals, pantoprazole,  risperiDONE, timolol, traZODone, and umeclidinium-vilanterol  History Review  Allergy: Mr. Detjen is allergic to azithromycin. Drug: Mr. Ludington  reports no history of drug use. Alcohol:  reports current alcohol use. Tobacco:  reports that he has quit smoking. He quit after 15.00 years of use. His smokeless tobacco use includes chew. Social: Mr. Dhondt  reports that he has quit smoking. He quit after 15.00 years of use. His smokeless tobacco use includes chew. He reports current alcohol use. He reports that he does not use drugs. Medical:  has a past medical history of Anxiety, Cancer ([place]), COPD (chronic obstructive pulmonary disease) ([place]), Glaucoma, and Osteoporosis. Surgical: Mr. Lizer  has a past surgical history that includes Cholecystectomy and Transurethral resection of prostate (N/A, 2008). Family: family history includes Depression in his mother; Heart disease in his father.  Laboratory Chemistry Profile   Renal Lab Results  Component Value Date   BUN 15 12/26/2019   CREATININE 1.07 12/26/2019   LABCREA 631 12/13/2019   BCR 28 (H) 07/10/2018   GFRAA >60 12/26/2019   GFRNONAA >60 12/26/2019     Hepatic Lab Results  Component Value Date   AST 24 12/26/2019   ALT 20 12/26/2019   ALBUMIN 4.2 12/26/2019   ALKPHOS 51 12/26/2019   LIPASE 24 06/04/2018   AMMONIA 25 09/13/2019     Electrolytes Lab Results  Component Value Date   NA 139 12/26/2019   K 4.1 12/26/2019   CL 104 12/26/2019   CALCIUM 9.7 12/26/2019   MG 2.3 04/02/2019   PHOS 3.4 03/30/2019     Bone Lab Results  Component Value Date   VD25OH 26 08/15/2016   25OHVITD1 34 07/10/2018   25OHVITD2 <1.0 07/10/2018   25OHVITD3 34 07/10/2018     Inflammation (CRP: Acute Phase) (ESR: Chronic Phase) Lab Results  Component Value Date   CRP 5 07/10/2018   ESRSEDRATE 20 07/10/2018   LATICACIDVEN 1.1 12/13/2019       Note: Above Lab results reviewed.  Recent Imaging Review  CT Chest Wo  Contrast CLINICAL DATA:  Shortness of breath, abnormal chest x-ray  EXAM: CT CHEST WITHOUT CONTRAST  TECHNIQUE: Multidetector CT imaging of the chest was performed following the standard protocol without IV contrast.  COMPARISON:  Chest x-ray 12/26/2019, CT 12/07/2019  FINDINGS: Cardiovascular: Mild cardiomegaly. No pericardial effusion. Ascending thoracic aorta is mildly ectatic without aneurysmal dilation. Atherosclerotic calcifications of the aorta and coronary arteries.  Mediastinum/Nodes: Mildly enlarged lower right paratracheal lymph node measuring 10 mm short axis has slightly decreased from prior (series 2, image 53), previously 12 mm. No new or enlarging mediastinal lymph nodes. No evidence of axillary or hilar lymphadenopathy. Stable 1.5 cm right thyroid lobe nodule Recommend thyroid ultrasound (ref: J Am Coll Radiol. 2015 Feb;12(2): 143-50). Trachea and esophagus within normal limits.  Lungs/Pleura: Irregular nodular density within the lingula measuring 16 x 13 mm (series 3, image 80) appears slightly decreased in size compared to CT 12/07/2019. Persistent streaky ill-defined opacities adjacent to this nodular density. There are a few 2-3  mm subpleural nodular densities within the lingula, unchanged from prior. Lung fields are otherwise clear. No pleural effusion or pneumothorax.  Upper Abdomen: No acute findings within the visualized upper abdomen.  Musculoskeletal: Markedly exaggerated thoracic kyphosis with ankylosis of the visualized cervical and thoracic vertebral levels as well as ossification of the anterior longitudinal ligament. No new or acute osseous findings are identified.  IMPRESSION: 1. Irregular nodular density within the lingula measuring 16 x 13 mm appears slightly decreased in size compared to CT 12/07/2019 (previously measured 17 x 16 mm). Although this finding could represent an infectious or inflammatory process, malignancy is  not excluded and continued surveillance is recommended with short-term follow-up CT in 3 months. 2. Right lung is clear. No findings to correspond with previously described abnormality seen on chest radiograph. 3. Stable 1.5 cm right thyroid lobe nodule. Recommend thyroid ultrasound (ref: J Am Coll Radiol. 4. Aortic Atherosclerosis (ICD10-I70.0).  Electronically Signed   By: Davina Poke D.O.   On: 12/26/2019 12:55 DG Chest 2 View CLINICAL DATA:  Shortness of breath  EXAM: CHEST - 2 VIEW  COMPARISON:  12/13/2019  FINDINGS: The patient is rotated to the right on today's radiograph, reducing diagnostic sensitivity and specificity.  Indistinct left pulmonary nodule, lung cancer not excluded. There is some vague opacity around this nodule mildly obscuring its margins.  Indistinct nodular density at the right costophrenic angle, nonspecific. This could be from localized airspace opacity or another true pulmonary nodule.  No blunting of the lateral costophrenic angles. Upper normal heart size. Thoracic kyphosis and spondylosis.  IMPRESSION: 1. Indistinct left basilar pulmonary nodule, lung cancer not excluded. This is slightly less sharply defined than on the prior exam, possibly due to surrounding airspace opacity 2. New nodular density at the right costophrenic angle, nonspecific. 3. Thoracic kyphosis and spondylosis. 4. No overt edema or pleural effusion.  Electronically Signed   By: Van Clines M.D.   On: 12/26/2019 10:51 Note: Reviewed        Physical Exam  General appearance: Well nourished, well developed, and well hydrated. In no apparent acute distress Mental status: Alert, oriented x 3 (person, place, & time)       Respiratory: No evidence of acute respiratory distress Eyes: PERLA Vitals: BP 124/76   Pulse 98   Temp (!) 97 F (36.1 C) (Temporal)   Resp 14   Ht '5\' 10"'  (1.778 m)   Wt 230 lb (104.3 kg)   SpO2 97%   BMI 33.00 kg/m  BMI:  Estimated body mass index is 33 kg/m as calculated from the following:   Height as of this encounter: '5\' 10"'  (1.778 m).   Weight as of this encounter: 230 lb (104.3 kg). Ideal: Ideal body weight: 73 kg (160 lb 15 oz) Adjusted ideal body weight: 85.5 kg (188 lb 9 oz)  Assessment   Status Diagnosis  Controlled Controlled Controlled 1. Chronic pain syndrome   2. Chronic knee pain (Primary Area of Pain) (Bilateral) (L>R)   3. Tricompartment osteoarthritis of knee (Left)   4. Osteoarthritis of knee (Bilateral)   5. DISH (diffuse idiopathic skeletal hyperostosis)   6. Pharmacologic therapy   7. Noncompliance with medication treatment due to overuse of medication   8. History of suicide attempt (06/12/18)   9. Suicidal ideation   10. Suicide attempt ([place]) (05/13/18)   11. Depression with anxiety      Updated Problems: Problem  Acute Kidney Failure (Hcc)  Aki (Acute Kidney Injury) (Hcc)  Aspiration Pneumonia (Hcc)  Copd (Chronic Obstructive Pulmonary Disease) (Hcc)  Acute Respiratory Failure With Hypoxia (Hcc)  Depression With Anxiety  Depression  Hld (Hyperlipidemia)  Iron Deficiency Anemia  Fall At Home, Initial Encounter  Community Acquired Pneumonia  Acute Exacerbation of Chronic Obstructive Pulmonary Disease (Copd) (Hcc) (Resolved)    Plan of Care  Problem-specific:  No problem-specific Assessment & Plan notes found for this encounter.  Mr. Gavin Telford has a current medication list which includes the following long-term medication(s): albuterol, amlodipine, duloxetine, fenofibrate, ferrous sulfate, pantoprazole, risperidone, trazodone, and buprenorphine.  Pharmacotherapy (Medications Ordered): Meds ordered this encounter  Medications  . buprenorphine (BUTRANS) 10 MCG/HR PTWK    Sig: Place 1 patch onto the skin once a week for 28 days. Apply only 1 patch at a time and alternate sites weekly.    Dispense:  4 patch    Refill:  0    Chronic Pain: STOP Act (Not  applicable) Fill 1 day early if closed on refill date. Do not fill until: 12/31/2019. To last until: 01/28/2020.   Orders:  Orders Placed This Encounter  Procedures  . Radiofrequency,Genicular    Standing Status:   Future    Standing Expiration Date:   12/30/2020    Scheduling Instructions:     Side(s): Left Knee     Level(s): Superior-Lateral, Superior-Medial, and Inferior-Medial Genicular Nerve(s)     Sedation: Patient's choice.     Scheduling Timeframe: As soon as pre-approved    Order Specific Question:   Where will this procedure be performed?    Answer:   [facility]  . ToxASSURE Select 13 (MW), Urine    Volume: 30 ml(s). Minimum 3 ml of urine is needed. Document temperature of fresh sample. Indications: Long term (current) use of opiate analgesic (B09.628)    Order Specific Question:   Release to patient    Answer:   Immediate  . Drug Screen 10 W/Conf, Serum    Order Specific Question:   Release to patient    Answer:   Immediate  . Ambulatory referral to Psychiatry    Referral Priority:   Routine    Referral Type:   Psychiatric    Referral Reason:   Specialty Services Required    Referred to Provider:   Chucky May, MD    Requested Specialty:   Psychiatry    Number of Visits Requested:   1   Follow-up plan:   Return for Procedure (no sedation): (L) Genicular Nerve RFA #2.      Interventional management options: Considering: Palliative treatments seen below.   PRN Procedures: Palliative bilateral IA Hyalgan knee injections S3/N1  Palliative bilateral IA knee injections (w/ steroids) #1  Diagnostic bilateral genicular NB #2  Palliative bilateral genicular nerve RFA #2 (last done 06/10/2019)    Recent Visits Date Type Provider Dept  10/14/19 Telemedicine Milinda Pointer, MD [facility]  Showing recent visits within past 90 days and meeting all other requirements Today's Visits Date Type Provider Dept  12/31/19 Office Visit Milinda Pointer, MD [facility]  Showing today's visits and meeting all other requirements Future Appointments Date Type Provider Dept  01/26/20 Appointment Milinda Pointer, MD [facility]  Showing future appointments within next 90 days and meeting all other requirements  I discussed the assessment and treatment plan with the patient. The patient was provided an opportunity to ask questions and all were answered. The patient agreed with the plan and demonstrated an understanding of the instructions.  Patient advised to call  back or seek an in-person evaluation if the symptoms or condition worsens.  Duration of encounter: 35 minutes.  Note by: Aaron Cola, MD Date: 12/31/2019; Time: 5:21 PM

## 2019-12-31 ENCOUNTER — Other Ambulatory Visit: Payer: Self-pay

## 2019-12-31 ENCOUNTER — Ambulatory Visit: Payer: No Typology Code available for payment source | Attending: Pain Medicine | Admitting: Pain Medicine

## 2019-12-31 ENCOUNTER — Telehealth: Payer: Self-pay | Admitting: Pain Medicine

## 2019-12-31 VITALS — BP 124/76 | HR 98 | Temp 97.0°F | Resp 14 | Ht 70.0 in | Wt 230.0 lb

## 2019-12-31 DIAGNOSIS — M4810 Ankylosing hyperostosis [Forestier], site unspecified: Secondary | ICD-10-CM | POA: Diagnosis present

## 2019-12-31 DIAGNOSIS — G8929 Other chronic pain: Secondary | ICD-10-CM

## 2019-12-31 DIAGNOSIS — G894 Chronic pain syndrome: Secondary | ICD-10-CM | POA: Diagnosis present

## 2019-12-31 DIAGNOSIS — F418 Other specified anxiety disorders: Secondary | ICD-10-CM | POA: Diagnosis present

## 2019-12-31 DIAGNOSIS — M1712 Unilateral primary osteoarthritis, left knee: Secondary | ICD-10-CM | POA: Insufficient documentation

## 2019-12-31 DIAGNOSIS — M170 Bilateral primary osteoarthritis of knee: Secondary | ICD-10-CM

## 2019-12-31 DIAGNOSIS — M25561 Pain in right knee: Secondary | ICD-10-CM | POA: Insufficient documentation

## 2019-12-31 DIAGNOSIS — M25562 Pain in left knee: Secondary | ICD-10-CM | POA: Insufficient documentation

## 2019-12-31 DIAGNOSIS — T1491XA Suicide attempt, initial encounter: Secondary | ICD-10-CM | POA: Diagnosis present

## 2019-12-31 DIAGNOSIS — Z9114 Patient's other noncompliance with medication regimen: Secondary | ICD-10-CM | POA: Diagnosis present

## 2019-12-31 DIAGNOSIS — R45851 Suicidal ideations: Secondary | ICD-10-CM | POA: Diagnosis present

## 2019-12-31 DIAGNOSIS — Z915 Personal history of self-harm: Secondary | ICD-10-CM

## 2019-12-31 DIAGNOSIS — Z9151 Personal history of suicidal behavior: Secondary | ICD-10-CM

## 2019-12-31 DIAGNOSIS — Z79899 Other long term (current) drug therapy: Secondary | ICD-10-CM | POA: Diagnosis present

## 2019-12-31 MED ORDER — BUPRENORPHINE 10 MCG/HR TD PTWK: 1.0000 | MEDICATED_PATCH | TRANSDERMAL | 0 refills | Status: DC

## 2019-12-31 NOTE — Telephone Encounter (Signed)
Aaron Gibbs at [facility] [phone] called saying they have to notify and make sure it is ok to continue with changes in scripts for this patient as he take rispirodal 2mg . And there is potential interaction. They need nurse to call.

## 2019-12-31 NOTE — Progress Notes (Signed)
Nursing Pain Medication Assessment:  Safety precautions to be maintained throughout the outpatient stay will include: orient to surroundings, keep bed in low position, maintain call bell within reach at all times, provide assistance with transfer out of bed and ambulation.  Medication Inspection Compliance: Aaron Gibbs did not comply with our request to bring his pills to be counted. He was reminded that bringing the medication bottles, even when empty, is a requirement.  Medication: None brought in. Pill/Patch Count: None available to be counted. Bottle Appearance: No container available. Did not bring bottle(s) to appointment. Filled Date: N/A Last Medication intake:  Today

## 2019-12-31 NOTE — Patient Instructions (Addendum)
____________________________________________________________________________________________  Drug Holidays (Slow)  What is a "Drug Holiday"? Drug Holiday: is the name given to the period of time during which a patient stops taking a medication(s) for the purpose of eliminating tolerance to the drug.  Benefits . Improved effectiveness of opioids. . Decreased opioid dose needed to achieve benefits. . Improved pain with lesser dose.  What is tolerance? Tolerance: is the progressive decreased in effectiveness of a drug due to its repetitive use. With repetitive use, the body gets use to the medication and as a consequence, it loses its effectiveness. This is a common problem seen with opioid pain medications. As a result, a larger dose of the drug is needed to achieve the same effect that used to be obtained with a smaller dose.  How long should a "Drug Holiday" last? You should stay off of the pain medicine for at least 14 consecutive days. (2 weeks)  Should I stop the medicine "cold [place]"? No. You should always coordinate with your Pain Specialist so that he/she can provide you with the correct medication dose to make the transition as smoothly as possible.  How do I stop the medicine? Slowly. You will be instructed to decrease the daily amount of pills that you take by one (1) pill every seven (7) days. This is called a "slow downward taper" of your dose. For example: if you normally take four (4) pills per day, you will be asked to drop this dose to three (3) pills per day for seven (7) days, then to two (2) pills per day for seven (7) days, then to one (1) per day for seven (7) days, and at the end of those last seven (7) days, this is when the "Drug Holiday" would start.   Will I have withdrawals? By doing a "slow downward taper" like this one, it is unlikely that you will experience any significant withdrawal symptoms. Typically, what triggers withdrawals is the sudden stop of a high  dose opioid therapy. Withdrawals can usually be avoided by slowly decreasing the dose over a prolonged period of time.  What are withdrawals? Withdrawals: refers to the wide range of symptoms that occur after stopping or dramatically reducing opiate drugs after heavy and prolonged use. Withdrawal symptoms do not occur to patients that use low dose opioids, or those who take the medication sporadically. Contrary to benzodiazepine (example: Valium, Xanax, etc.) or alcohol withdrawals ("Delirium Tremens"), opioid withdrawals are not lethal. Withdrawals are the physical manifestation of the body getting rid of the excess receptors.  Expected Symptoms Early symptoms of withdrawal may include: . Agitation . Anxiety . Muscle aches . Increased tearing . Insomnia . Runny nose . Sweating . Yawning  Late symptoms of withdrawal may include: . Abdominal cramping . Diarrhea . Dilated pupils . Goose bumps . Nausea . Vomiting  Will I experience withdrawals? Due to the slow nature of the taper, it is very unlikely that you will experience any.  What is a slow taper? Taper: refers to the gradual decrease in dose.  ___________________________________________________________________________________________    Buprenorphine transdermal patch What is this medicine? BUPRENORPHINE (byoo pre NOR feen) is a pain reliever. It is used to treat moderate to severe pain. This medicine may be used for other purposes; ask your health care provider or pharmacist if you have questions. COMMON BRAND NAME(S): Butrans What should I tell my health care provider before I take this medicine? They need to know if you have any of these conditions:  blockage in your  bowel  brain tumor  drink more than 3 alcohol-containing drinks per day  drug abuse or addiction  fever  head injury  kidney disease  liver disease  lung or breathing disease, like asthma  thyroid disease  trouble passing urine or change  in the amount of urine  an unusual or allergic reaction to buprenorphine, other medicines, foods, dyes, or preservatives  pregnant or trying to get pregnant  breast-feeding How should I use this medicine? Apply the patch to your skin. Do not cut or damage the patch. A cut or damaged patch can be very dangerous because you may get too much medicine. Select a clean, dry area of skin on your upper outer arm, upper chest, upper back, or the side of the chest. Do not apply the patch to broken, burned, cut, or irritated skin. Use only water to clean the area. Do not use soap or alcohol to clean the skin because this can increase the effects of the medicine. If the area is hairy, clip the hair with scissors, but do not shave. Take the patch out of its wrapper. Bend the patch along the faint line and slowly peel the outer portion of the liner, which covers the sticky surface of the patch. Press the patch onto the skin and slowly peel off the protective liner. Do not use a patch if the packaging or backing is damaged. Do not touch the sticky part with your fingers. Press the patch to the skin using the palm of your hand. Press the patch to the skin for 15 seconds. Wash your hands at once. Keep patches far away from children. Do not let children see you apply the patch and do not apply it where children can see it. Do not call the patch a sticker, tattoo, or bandage, as this could encourage the child to mimic your actions. Used patches still contain medicine. Children or pets can have serious side effects or die from putting used patches in their mouth or on their bodies. Take off the old patch before putting on a new patch. Apply each new patch to a different area of skin. If a patch comes off or causes irritation, remove it and apply a new patch to a different site. If the edges of the patch start to loosen, first apply first aid tape to the edges of the patch. If problems with the patch not sticking continue,  cover the patch with a see-through adhesive dressing (like Bioclusive or Tegaderm). Never cover the patch with any other bandage or tape. To get rid of used patches, fold the patch in half with the sticky sides together. Then, flush it down the toilet. Alternately, you may dispose of the patch in the Patch-Disposal Unit provided. Never throw the patch away in the trash without sealing it in the Patch-Disposal unit. Replace the patch every 7 days. Follow the directions on the prescription label. Do not use more medicine than you are told to use. A special MedGuide will be given to you by the pharmacist with each prescription and refill. Be sure to read this information carefully each time. Talk to your pediatrician regarding the use of this medicine in children. Special care may be needed. If a patch accidentally touches the skin, use only water to clean the area. Do not use soap or alcohol to clean the skin because this can increase the effects of the medicine. If someone accidentally uses a buprenorphine patch and is not awake and alert, immediately call 911  for help. If the person is awake and alert, call a doctor, health care professional, or the [employer]. Overdosage: If you think you have taken too much of this medicine contact a poison control center or emergency room at once. NOTE: This medicine is only for you. Do not share this medicine with others. What if I miss a dose? If you forget to replace your patch, take off the old patch and put on a new patch as soon as you can. Do not apply an extra patch to your skin. Do not wear more than one patch at the same time unless told to do so by your doctor or health care professional. What may interact with this medicine? Do not take this medication with any of the following medicines:  cisapride  certain medicines for fungal infections like ketoconazole and itraconazole  dronedarone  pimozide  thioridazine This medicine may interact  with the following medications:  alcohol  antihistamines for allergy, cough and cold  antiviral medicines for HIV or AIDS  atropine  certain antibiotics like clarithromycin, erythromycin, linezold, rifampin  certain medicines for anxiety or sleep  certain medicines for bladder problems like oxybutynin, tolterodine  certain medicines for depression like amitriptyline, fluoxetine, sertraline  certain medicines for migraine headache like almotriptan, eletriptan, frovatriptan, naratriptan, rizatriptan, sumatriptan, zolmitriptan  certain medicines for nausea or vomiting like dolasetron, ondansetron, palonosetron  certain medicines for Parkinson's disease like benztropine, trihexyphenidyl  certain medicines for seizures like phenobarbital, primidone  certain medicines for stomach problems like cimetidine, dicyclomine, hyoscyamine  certain medicines for travel sickness like scopolamine  diuretics  dofetilide  general anesthetics like halothane, isoflurane, methoxyflurane, propofol  ipratropium  local anesthetics like lidocaine, pramoxine, tetracaine  MAOIs like Carbex, Eldepryl, Marplan, Nardil, and Parnate  medicines that relax muscles for surgery  methylene blue  other medicines that prolong the QT interval (cause an abnormal heart rhythm)  other narcotic medicines for pain or cough  phenothiazines like chlorpromazine, mesoridazine, prochlorperazine  ritonavir  ziprasidone This list may not describe all possible interactions. Give your health care provider a list of all the medicines, herbs, non-prescription drugs, or dietary supplements you use. Also tell them if you smoke, drink alcohol, or use illegal drugs. Some items may interact with your medicine. What should I watch for while using this medicine? Tell your health care provider if your pain does not go away, if it gets worse, or if you have new or a different type of pain. You may develop tolerance to this  drug. Tolerance means that you will need a higher dose of the drug for pain relief. Tolerance is normal and is expected if you take this drug for a long time. Do not suddenly stop taking your drug because you may develop a severe reaction. Your body becomes used to the drug. This does NOT mean you are addicted. Addiction is a behavior related to getting and using a drug for a nonmedical reason. If you have pain, you have a medical reason to take pain drug. Your health care provider will tell you how much drug to take. If your health care provider wants you to stop the drug, the dose will be slowly lowered over time to avoid any side effects. If you take other drugs that also cause drowsiness like other narcotic pain drugs, benzodiazepines, or other drugs for sleep, you may have more side effects. Give your health care provider a list of all drugs you use. He or she will tell you how  much drug to take. Do not take more drug than directed. Get emergency help right away if you have trouble breathing or are unusually tired or sleepy. Talk to your health care provider about naloxone and how to get it. Naloxone is an emergency drug used for an opioid overdose. An overdose can happen if you take too much opioid. It can also happen if an opioid is taken with some other drugs or substances, like alcohol. Know the symptoms of an overdose, like trouble breathing, unusually tired or sleepy, or not being able to respond or wake up. Make sure to tell caregivers and close contacts where it is stored. Make sure they know how to use it. After naloxone is given, you must get emergency help right away. Naloxone is a temporary treatment. Repeat doses may be needed. You may get drowsy or dizzy. Do not drive, use machinery, or do anything that needs mental alertness until you know how this drug affects you. Do not stand up or sit up quickly, especially if you are an older patient. This reduces the risk of dizzy or fainting spells.  Alcohol may interfere with the effect of this drug. Avoid alcoholic drinks. This drug will cause constipation. If you do not have a bowel movement for 3 days, call your health care provider. Your mouth may get dry. Chewing sugarless gum or sucking hard candy and drinking plenty of water may help. Contact your health care provider if the problem does not go away or is severe. This patch is sensitive to body heat changes. If your skin gets too hot, more drug will come out of the patch and can cause a deadly overdose. Call your health care provider if you get a fever. Do not take hot baths. Do not sunbathe. Do not use hot tubs, saunas, hairdryers, heating pads, electric blankets, heated waterbeds, or tanning lamps. Do not do exercise that increases your body temperature. What side effects may I notice from receiving this medicine? Side effects that you should report to your doctor or health care professional as soon as possible:  allergic reactions like skin rash, itching or hives, swelling of the face, lips, or tongue  breathing problems  confusion  signs and symptoms of a dangerous change in heartbeat or heart rhythm like chest pain; dizziness; fast or irregular heartbeat; palpitations; feeling faint or lightheaded, falls; breathing problems  signs and symptoms of liver injury like dark yellow or brown urine; general ill feeling or flu-like symptoms; light-colored stools; loss of appetite; nausea; right upper belly pain; unusually weak or tired; yellow of the eyes or skin  signs and symptoms of low blood pressure like dizziness; feeling faint or lightheaded, falls; unusually weak or tired  trouble passing urine or change in the amount of urine Side effects that usually do not require medical attention (report to your doctor or health care professional if they continue or are bothersome):  constipation  dry mouth  itching, redness, or rash at the patch site  nausea,  vomiting  tiredness This list may not describe all possible side effects. Call your doctor for medical advice about side effects. You may report side effects to FDA at 1-800-FDA-1088. Where should I keep my medicine? Keep out of the reach of children. This medicine can be abused. Keep your medicine in a safe place to protect it from theft. Do not share this medicine with anyone. Selling or giving away this medicine is dangerous and against the law. Store at room temperature between 59  and 86 degrees F (15 and 30 degrees C). Do not store the patches out of their wrappers. This medicine may cause harm and death if it is taken by other adults, children, or pets. Return medicine that has not been used to an official disposal site. Contact the DEA at (620) 654-6220 or your city/county government to find a site. If you cannot return the medicine, flush it down the toilet as instructed above. Do not use the medicine after the expiration date. NOTE: This sheet is a summary. It may not cover all possible information. If you have questions about this medicine, talk to your doctor, pharmacist, or health care provider.  2020 Elsevier/Gold Standard (2019-01-14 16:09:58)  _____________________________________________________  ____________________________________________________________________________________________  Preparing for Procedure with Sedation  Procedure appointments are limited to planned procedures: . No Prescription Refills. . No disability issues will be discussed. . No medication changes will be discussed.  Instructions: . Oral Intake: Do not eat or drink anything for at least 8 hours prior to your procedure. (Exception: Blood Pressure Medication. See below.) . Transportation: Unless otherwise stated by your physician, you may drive yourself after the procedure. . Blood Pressure Medicine: Do not forget to take your blood pressure medicine with a sip of water the morning of the procedure.  If your Diastolic (lower reading)is above 100 mmHg, elective cases will be cancelled/rescheduled. . Blood thinners: These will need to be stopped for procedures. Notify our staff if you are taking any blood thinners. Depending on which one you take, there will be specific instructions on how and when to stop it. . Diabetics on insulin: Notify the staff so that you can be scheduled 1st case in the morning. If your diabetes requires high dose insulin, take only  of your normal insulin dose the morning of the procedure and notify the staff that you have done so. . Preventing infections: Shower with an antibacterial soap the morning of your procedure. . Build-up your immune system: Take 1000 mg of Vitamin C with every meal (3 times a day) the day prior to your procedure. Marland Kitchen Antibiotics: Inform the staff if you have a condition or reason that requires you to take antibiotics before dental procedures. . Pregnancy: If you are pregnant, call and cancel the procedure. . Sickness: If you have a cold, fever, or any active infections, call and cancel the procedure. . Arrival: You must be in the facility at least 30 minutes prior to your scheduled procedure. . Children: Do not bring children with you. . Dress appropriately: Bring dark clothing that you would not mind if they get stained. . Valuables: Do not bring any jewelry or valuables.  Reasons to call and reschedule or cancel your procedure: (Following these recommendations will minimize the risk of a serious complication.) . Surgeries: Avoid having procedures within 2 weeks of any surgery. (Avoid for 2 weeks before or after any surgery). . Flu Shots: Avoid having procedures within 2 weeks of a flu shots or . (Avoid for 2 weeks before or after immunizations). . Barium: Avoid having a procedure within 7-10 days after having had a radiological study involving the use of radiological contrast. (Myelograms, Barium swallow or enema study). . Heart attacks: Avoid  any elective procedures or surgeries for the initial 6 months after a "Myocardial Infarction" (Heart Attack). . Blood thinners: It is imperative that you stop these medications before procedures. Let us know if you if you take any blood thinner.  . Infection: Avoid procedures during or within two weeks of  an infection (including chest colds or gastrointestinal problems). Symptoms associated with infections include: Localized redness, fever, chills, night sweats or profuse sweating, burning sensation when voiding, cough, congestion, stuffiness, runny nose, sore throat, diarrhea, nausea, vomiting, cold or Flu symptoms, recent or current infections. It is specially important if the infection is over the area that we intend to treat. Marland Kitchen Heart and lung problems: Symptoms that may suggest an active cardiopulmonary problem include: cough, chest pain, breathing difficulties or shortness of breath, dizziness, ankle swelling, uncontrolled high or unusually low blood pressure, and/or palpitations. If you are experiencing any of these symptoms, cancel your procedure and contact your primary care physician for an evaluation.  Remember:  Regular Business hours are:  Monday to Thursday 8:00 AM to 4:00 PM  Provider's Schedule: Milinda Pointer, MD:  Procedure days: Tuesday and Thursday 7:30 AM to 4:00 PM  Gillis Santa, MD:  Procedure days: Monday and Wednesday 7:30 AM to 4:00 PM ____________________________________________________________________________________________

## 2020-01-01 NOTE — Telephone Encounter (Signed)
Spoke with Orlando Penner, ok to take medications prescribed by Dr. Dossie Arbour.

## 2020-01-08 LAB — BENZODIAZEPINES,MS,WB/SP RFX

## 2020-01-08 LAB — DRUG SCREEN 10 W/CONF, SERUM
Amphetamines, IA: NEGATIVE ng/mL
Barbiturates, IA: NEGATIVE ug/mL
Cocaine & Metabolite, IA: NEGATIVE ng/mL
Methadone, IA: NEGATIVE ng/mL
Opiates, IA: NEGATIVE ng/mL
Phencyclidine, IA: NEGATIVE ng/mL
Propoxyphene, IA: NEGATIVE ng/mL
THC(Marijuana) Metabolite, IA: NEGATIVE ng/mL

## 2020-01-08 LAB — OXYCODONES,MS,WB/SP RFX

## 2020-01-19 ENCOUNTER — Telehealth: Payer: Self-pay | Admitting: General Practice

## 2020-01-19 ENCOUNTER — Telehealth: Payer: Self-pay

## 2020-01-19 NOTE — Telephone Encounter (Signed)
  Chronic Care Management   Outreach Note  01/19/2020 Name: Aaron Gibbs MRN: [id number] DOB: 1934/12/01  Referred by: Olin Hauser, DO Reason for referral : Appointment Day [facility] Outreach Follow up Call for Chronic Disease Managment and Care Coordiantion Needs)   An unsuccessful telephone outreach was attempted today. The patient was referred to the case management team for assistance with care management and care coordination.   Follow Up Plan: The care management team will reach out to the patient again over the next 30 to 60 days.   Noreene Larsson RN, MSN, [place] [place] Mobile: [phone]

## 2020-01-20 ENCOUNTER — Telehealth: Payer: Self-pay | Admitting: *Deleted

## 2020-01-20 ENCOUNTER — Ambulatory Visit: Payer: Medicare Other | Admitting: Pain Medicine

## 2020-01-20 NOTE — Chronic Care Management (AMB) (Signed)
  Chronic Care Management   Note  01/20/2020 Name: Aaron Gibbs MRN: [id number] DOB: 01-May-1935  Aaron Gibbs is a 85 y.o. year old male who is a primary care patient of Olin Hauser, DO and is actively engaged with the care management team. I reached out to [name] by phone today to assist with re-scheduling a follow up visit with the RN Case Manager.  Follow up plan: Unsuccessful telephone outreach attempt made. A HIPPA compliant phone message was left for the patient providing contact information and requesting a return call. The care management team will reach out to the patient again over the next 7 days. If patient returns call to provider office, please advise to call [place] at [phone].  [place], [place] [address] Direct Dial: [phone] [email or web address]: [email or web address]

## 2020-01-21 ENCOUNTER — Telehealth: Payer: Self-pay

## 2020-01-21 ENCOUNTER — Telehealth: Payer: Self-pay | Admitting: Pharmacist

## 2020-01-21 NOTE — Telephone Encounter (Signed)
  Chronic Care Management   Outreach Note  01/21/2020 Name: Aaron Gibbs MRN: [id number] DOB: 03/17/1935  Referred by: Olin Hauser, DO Reason for referral : No chief complaint on file.   An unsuccessful telephone outreach was attempted today. The patient was referred to the case management team for assistance with care management and care coordination.   Follow Up Plan: Will collaborate with Care Guide to outreach to schedule follow up with me  [name], PharmD, [place] Management [phone]

## 2020-01-24 NOTE — Progress Notes (Deleted)
PROVIDER NOTE: Information contained herein reflects review and annotations entered in association with encounter. Interpretation of such information and data should be left to medically-trained personnel. Information provided to patient can be located elsewhere in the medical record under "Patient Instructions". Document created using STT-dictation technology, any transcriptional errors that may result from process are unintentional.    Patient: Aaron Gibbs  Service Category: E/M  Provider: Gaspar Cola, MD  DOB: 14-Mar-1935  DOS: 01/26/2020  Specialty: Interventional Pain Management  MRN: [id number]  Setting: Ambulatory outpatient  PCP: Olin Hauser, DO  Type: Established Patient    Referring Provider: [name] *  Location: Office  Delivery: Face-to-face     HPI  Reason for encounter: [name], a 85 y.o. year old male, is here today for evaluation and management of his No primary diagnosis found.. [name] primary complain today is No chief complaint on file. Last encounter: Practice (12/31/2019). My last encounter with him was on 12/31/2019. Pertinent problems: [name] has Chronic pain syndrome; DISH (diffuse idiopathic skeletal hyperostosis); Osteoarthritis of multiple joints; Chronic knee pain (Primary Area of Pain) (Bilateral) (L>R); Chronic low back pain (Bilateral) w/o sciatica; Somatic symptom disorder; Tricompartment osteoarthritis of knee (Left); Osteoarthritis of knee (Bilateral); Osteoarthritis of patellofemoral joint (Right); Neurogenic pain; Lateral meniscal tear, sequela (Left); Medial meniscal tear, sequela (Left); and Patellar tendinosis (Right) on their pertinent problem list. Pain Assessment: Severity of   is reported as a  /10. Location:    / . Onset:  . Quality:  . Timing:  . Modifying factor(s):  Marland Kitchen Vitals:  vitals were not taken for this visit.   *** PMP noncompliant.  On 12/31/2019 I provided the patient with a buprenorphine  10 mcg/h patch to last for 28 days, by 01/06/2020 he had already obtained a second prescription for the same patch to last for another 28 days, from a different physician.  On his last visit on 12/31/2019.  On this appointment I discontinued the patient has oxycodone secondary to his suicidal ideations issues and a high level suspicion that he was misusing his medications.  His buprenorphine patch management seems to have been taken over by Leonard Downing, PA.  The patient had expressed some interest in having the radiofrequency of genicular nerves repeated.  Pharmacotherapy Assessment   Analgesic: No opioid analgesics prescribed by our practice.  Buprenorphine 10 mcg/h patch q. 7 days.  This apparently has been taken over by Leonard Downing, PA. MME/day: 0 mg/day.   Monitoring: [place] PMP: PDMP reviewed during this encounter.       Pharmacotherapy: No side-effects or adverse reactions reported. Compliance: No problems identified. Effectiveness: Clinically acceptable.  No notes on file  UDS:  Summary  Date Value Ref Range Status  07/10/2018 FINAL  Final    Comment:    ==================================================================== [facility]RE COMP DRUG ANALYSIS,UR ==================================================================== Test                             Result       Flag       Units Drug Present and Declared for Prescription Verification   Morphine                       >38101       EXPECTED   ng/mg creat   Normorphine  221          EXPECTED   ng/mg creat    Potential sources of large amounts of morphine in the absence of    codeine include administration of morphine or use of heroin.    Normorphine is an expected metabolite of morphine.   Hydromorphone                  88           EXPECTED   ng/mg creat    Hydromorphone may be present as a metabolite of morphine;    concentrations of hydromorphone rarely exceed 5% of the morphine    concentration when this is  the source of hydromorphone.   Oxycodone                      1094         EXPECTED   ng/mg creat   Oxymorphone                    1028         EXPECTED   ng/mg creat   Noroxycodone                   3205         EXPECTED   ng/mg creat   Noroxymorphone                 323          EXPECTED   ng/mg creat    Sources of oxycodone are scheduled prescription medications.    Oxymorphone, noroxycodone, and noroxymorphone are expected    metabolites of oxycodone. Oxymorphone is also available as a    scheduled prescription medication.   Duloxetine                     PRESENT      EXPECTED   Quetiapine                     PRESENT      EXPECTED   Acetaminophen                  PRESENT      EXPECTED Drug Present not Declared for Prescription Verification   Chlorpheniramine               PRESENT      UNEXPECTED   Hydroxyzine                    PRESENT      UNEXPECTED Drug Absent but Declared for Prescription Verification   Pregabalin                     Not Detected UNEXPECTED   Salicylate                     Not Detected UNEXPECTED    Aspirin, as indicated in the declared medication list, is not    always detected even when used as directed. ==================================================================== Test                      Result    Flag   Units      Ref Range   Creatinine              94  mg/dL      >=20 ==================================================================== Declared Medications:  The flagging and interpretation on this report are based on the  following declared medications.  Unexpected results may arise from  inaccuracies in the declared medications.  **Note: The testing scope of this panel includes these medications:  Duloxetine  Morphine (Morphine Sulfate)  Oxycodone (Oxycodone Acetaminophen)  Pregabalin  Quetiapine  **Note: The testing scope of this panel does not include small to  moderate amounts of these reported medications:  Acetaminophen  (Oxycodone Acetaminophen)  Aspirin  **Note: The testing scope of this panel does not include following  reported medications:  Amlodipine (Norvasc)  Doxycycline  Fenofibrate  Iron (Ferrous Sulfate)  Meloxicam  Multivitamin  Pantoprazole (Protonix)  Polyethylene Glycol  Prednisolone  Sennosides  Timolol  Umeclidinium (Anoro Ellipta)  Vilanterol (Anoro Ellipta) ==================================================================== For clinical consultation, please call [phone]. ====================================================================      ROS  Constitutional: Denies any fever or chills Gastrointestinal: No reported hemesis, hematochezia, vomiting, or acute GI distress Musculoskeletal: Denies any acute onset joint swelling, redness, loss of ROM, or weakness Neurological: No reported episodes of acute onset apraxia, aphasia, dysarthria, agnosia, amnesia, paralysis, loss of coordination, or loss of consciousness  Medication Review  DULoxetine, albuterol, amLODipine, aspirin EC, buprenorphine, diclofenac sodium, fenofibrate, ferrous sulfate, multivitamin with minerals, pantoprazole, risperiDONE, timolol, traZODone, and umeclidinium-vilanterol  History Review  Allergy: Mr. Huskins is allergic to azithromycin. Drug: Mr. Eltringham  reports no history of drug use. Alcohol:  reports current alcohol use. Tobacco:  reports that he has quit smoking. He quit after 15.00 years of use. His smokeless tobacco use includes chew. Social: Mr. Simonetti  reports that he has quit smoking. He quit after 15.00 years of use. His smokeless tobacco use includes chew. He reports current alcohol use. He reports that he does not use drugs. Medical:  has a past medical history of Anxiety, Cancer ([place]), COPD (chronic obstructive pulmonary disease) ([place]), Glaucoma, and Osteoporosis. Surgical: Mr. Lamping  has a past surgical history that includes Cholecystectomy and Transurethral resection of  prostate (N/A, 2008). Family: family history includes Depression in his mother; Heart disease in his father.  Laboratory Chemistry Profile   Renal Lab Results  Component Value Date   BUN 15 12/26/2019   CREATININE 1.07 12/26/2019   LABCREA 631 12/13/2019   BCR 28 (H) 07/10/2018   GFRAA >60 12/26/2019   GFRNONAA >60 12/26/2019     Hepatic Lab Results  Component Value Date   AST 24 12/26/2019   ALT 20 12/26/2019   ALBUMIN 4.2 12/26/2019   ALKPHOS 51 12/26/2019   LIPASE 24 06/04/2018   AMMONIA 25 09/13/2019     Electrolytes Lab Results  Component Value Date   NA 139 12/26/2019   K 4.1 12/26/2019   CL 104 12/26/2019   CALCIUM 9.7 12/26/2019   MG 2.3 04/02/2019   PHOS 3.4 03/30/2019     Bone Lab Results  Component Value Date   VD25OH 26 08/15/2016   25OHVITD1 34 07/10/2018   25OHVITD2 <1.0 07/10/2018   25OHVITD3 34 07/10/2018     Inflammation (CRP: Acute Phase) (ESR: Chronic Phase) Lab Results  Component Value Date   CRP 5 07/10/2018   ESRSEDRATE 20 07/10/2018   LATICACIDVEN 1.1 12/13/2019       Note: Above Lab results reviewed.  Recent Imaging Review  CT Chest Wo Contrast CLINICAL DATA:  Shortness of breath, abnormal chest x-ray  EXAM: CT CHEST WITHOUT CONTRAST  TECHNIQUE: Multidetector CT imaging of the chest was performed  following the standard protocol without IV contrast.  COMPARISON:  Chest x-ray 12/26/2019, CT 12/07/2019  FINDINGS: Cardiovascular: Mild cardiomegaly. No pericardial effusion. Ascending thoracic aorta is mildly ectatic without aneurysmal dilation. Atherosclerotic calcifications of the aorta and coronary arteries.  Mediastinum/Nodes: Mildly enlarged lower right paratracheal lymph node measuring 10 mm short axis has slightly decreased from prior (series 2, image 53), previously 12 mm. No new or enlarging mediastinal lymph nodes. No evidence of axillary or hilar lymphadenopathy. Stable 1.5 cm right thyroid lobe nodule  Recommend thyroid ultrasound (ref: J Am Coll Radiol. 2015 Feb;12(2): 143-50). Trachea and esophagus within normal limits.  Lungs/Pleura: Irregular nodular density within the lingula measuring 16 x 13 mm (series 3, image 80) appears slightly decreased in size compared to CT 12/07/2019. Persistent streaky ill-defined opacities adjacent to this nodular density. There are a few 2-3 mm subpleural nodular densities within the lingula, unchanged from prior. Lung fields are otherwise clear. No pleural effusion or pneumothorax.  Upper Abdomen: No acute findings within the visualized upper abdomen.  Musculoskeletal: Markedly exaggerated thoracic kyphosis with ankylosis of the visualized cervical and thoracic vertebral levels as well as ossification of the anterior longitudinal ligament. No new or acute osseous findings are identified.  IMPRESSION: 1. Irregular nodular density within the lingula measuring 16 x 13 mm appears slightly decreased in size compared to CT 12/07/2019 (previously measured 17 x 16 mm). Although this finding could represent an infectious or inflammatory process, malignancy is not excluded and continued surveillance is recommended with short-term follow-up CT in 3 months. 2. Right lung is clear. No findings to correspond with previously described abnormality seen on chest radiograph. 3. Stable 1.5 cm right thyroid lobe nodule. Recommend thyroid ultrasound (ref: J Am Coll Radiol. 4. Aortic Atherosclerosis (ICD10-I70.0).  Electronically Signed   By: Davina Poke D.O.   On: 12/26/2019 12:55 DG Chest 2 View CLINICAL DATA:  Shortness of breath  EXAM: CHEST - 2 VIEW  COMPARISON:  12/13/2019  FINDINGS: The patient is rotated to the right on today's radiograph, reducing diagnostic sensitivity and specificity.  Indistinct left pulmonary nodule, lung cancer not excluded. There is some vague opacity around this nodule mildly obscuring its margins.  Indistinct  nodular density at the right costophrenic angle, nonspecific. This could be from localized airspace opacity or another true pulmonary nodule.  No blunting of the lateral costophrenic angles. Upper normal heart size. Thoracic kyphosis and spondylosis.  IMPRESSION: 1. Indistinct left basilar pulmonary nodule, lung cancer not excluded. This is slightly less sharply defined than on the prior exam, possibly due to surrounding airspace opacity 2. New nodular density at the right costophrenic angle, nonspecific. 3. Thoracic kyphosis and spondylosis. 4. No overt edema or pleural effusion.  Electronically Signed   By: Van Clines M.D.   On: 12/26/2019 10:51 Note: Reviewed        Physical Exam  General appearance: Well nourished, well developed, and well hydrated. In no apparent acute distress Mental status: Alert, oriented x 3 (person, place, & time)       Respiratory: No evidence of acute respiratory distress Eyes: PERLA Vitals: There were no vitals taken for this visit. BMI: Estimated body mass index is 33 kg/m as calculated from the following:   Height as of 12/31/19: '5\' 10"'  (1.778 m).   Weight as of 12/31/19: 230 lb (104.3 kg). Ideal: Patient weight not recorded  Assessment   Status Diagnosis  Controlled Controlled Controlled No diagnosis found.   Updated Problems: No problems updated.  Plan of  Care  Problem-specific:  No problem-specific Assessment & Plan notes found for this encounter.  Mr. Takai Chiaramonte has a current medication list which includes the following long-term medication(s): albuterol, amlodipine, buprenorphine, duloxetine, fenofibrate, ferrous sulfate, pantoprazole, risperidone, and trazodone.  Pharmacotherapy (Medications Ordered): No orders of the defined types were placed in this encounter.  Orders:  No orders of the defined types were placed in this encounter.  Follow-up plan:   No follow-ups on file.      Interventional management  options: Considering: Palliative treatments seen below.   PRN Procedures: Palliative bilateral IA Hyalgan knee injections S3/N1  Palliative bilateral IA knee injections (w/ steroids) #1  Diagnostic bilateral genicular NB #2  Palliative bilateral genicular nerve RFA #2 (last done 06/10/2019)     Recent Visits Date Type Provider Dept  12/31/19 Office Visit Milinda Pointer, MD [facility]  Showing recent visits within past 90 days and meeting all other requirements Future Appointments Date Type Provider Dept  01/26/20 Appointment Milinda Pointer, MD [facility]  Showing future appointments within next 90 days and meeting all other requirements  I discussed the assessment and treatment plan with the patient. The patient was provided an opportunity to ask questions and all were answered. The patient agreed with the plan and demonstrated an understanding of the instructions.  Patient advised to call back or seek an in-person evaluation if the symptoms or condition worsens.  Duration of encounter: *** minutes.  Note by: Gaspar Cola, MD Date: 01/26/2020; Time: 8:06 PM

## 2020-01-26 ENCOUNTER — Encounter: Payer: Self-pay | Admitting: Pain Medicine

## 2020-01-26 ENCOUNTER — Encounter: Payer: Medicare Other | Admitting: Pain Medicine

## 2020-01-30 NOTE — Telephone Encounter (Signed)
PT has been in [facility] per pt Rescheduled for 8/19 with RN CM and 9/1 with PharmD

## 2020-01-30 NOTE — Chronic Care Management (AMB) (Signed)
  Chronic Care Management   Note  01/30/2020 Name: Aaron Gibbs MRN: [id number] DOB: 1934-09-10  Aaron Gibbs is a 85 y.o. year old male who is a primary care patient of Olin Hauser, DO and is actively engaged with the care management team. I reached out to [name] by phone today to assist with re-scheduling a follow up visit with the RN Case Manager and Pharmacist.  Follow up plan: Telephone appointment with care management team member scheduled for: 02/05/2020 with RN CM and 02/18/2020 with PharmD.  [name], [place] [id number] Direct Dial: [phone] [email or web address]: [email or web address]

## 2020-02-02 ENCOUNTER — Other Ambulatory Visit: Payer: Self-pay

## 2020-02-02 ENCOUNTER — Encounter: Payer: Self-pay | Admitting: Pain Medicine

## 2020-02-02 ENCOUNTER — Ambulatory Visit: Payer: Medicare Other | Attending: Pain Medicine | Admitting: Pain Medicine

## 2020-02-02 VITALS — BP 140/89 | HR 84 | Temp 97.8°F | Resp 16 | Ht 70.0 in | Wt 240.0 lb

## 2020-02-02 DIAGNOSIS — G894 Chronic pain syndrome: Secondary | ICD-10-CM | POA: Diagnosis not present

## 2020-02-02 DIAGNOSIS — M1712 Unilateral primary osteoarthritis, left knee: Secondary | ICD-10-CM

## 2020-02-02 DIAGNOSIS — Z79899 Other long term (current) drug therapy: Secondary | ICD-10-CM

## 2020-02-02 DIAGNOSIS — S83282S Other tear of lateral meniscus, current injury, left knee, sequela: Secondary | ICD-10-CM

## 2020-02-02 DIAGNOSIS — G8929 Other chronic pain: Secondary | ICD-10-CM | POA: Diagnosis present

## 2020-02-02 DIAGNOSIS — S83242S Other tear of medial meniscus, current injury, left knee, sequela: Secondary | ICD-10-CM | POA: Diagnosis present

## 2020-02-02 DIAGNOSIS — M170 Bilateral primary osteoarthritis of knee: Secondary | ICD-10-CM

## 2020-02-02 DIAGNOSIS — M4810 Ankylosing hyperostosis [Forestier], site unspecified: Secondary | ICD-10-CM | POA: Diagnosis present

## 2020-02-02 DIAGNOSIS — M25562 Pain in left knee: Secondary | ICD-10-CM | POA: Diagnosis present

## 2020-02-02 DIAGNOSIS — M25561 Pain in right knee: Secondary | ICD-10-CM | POA: Insufficient documentation

## 2020-02-02 MED ORDER — BUPRENORPHINE 10 MCG/HR TD PTWK: 1.0000 | MEDICATED_PATCH | TRANSDERMAL | 0 refills | Status: DC

## 2020-02-02 NOTE — Progress Notes (Signed)
PROVIDER NOTE: Information contained herein reflects review and annotations entered in association with encounter. Interpretation of such information and data should be left to medically-trained personnel. Information provided to patient can be located elsewhere in the medical record under "Patient Instructions". Document created using STT-dictation technology, any transcriptional errors that may result from process are unintentional.    Patient: Aaron Gibbs  Service Category: E/M  Provider: Gaspar Cola, MD  DOB: 1935/03/15  DOS: 02/02/2020  Specialty: Interventional Pain Management  MRN: [id number]  Setting: Ambulatory outpatient  PCP: Aaron Hauser, DO  Type: Established Patient    Referring Provider: [name] *  Location: Office  Delivery: Face-to-face     HPI  Reason for encounter: [name], a 85 y.o. year old male, is here today for evaluation and management of his Chronic pain syndrome [G89.4]. Aaron Gibbs primary complain today is Knee Pain (bilateral) Last encounter: Practice (12/31/2019). My last encounter with him was on 12/31/2019. Pertinent problems: Aaron Gibbs has Chronic pain syndrome; DISH (diffuse idiopathic skeletal hyperostosis); Osteoarthritis of multiple joints; Chronic knee pain (Primary Area of Pain) (Bilateral) (L>R); Chronic low back pain (Bilateral) w/o sciatica; Somatic symptom disorder; Tricompartment osteoarthritis of knee (Left); Osteoarthritis of knee (Bilateral); Osteoarthritis of patellofemoral joint (Right); Neurogenic pain; Lateral meniscal tear, sequela (Left); Medial meniscal tear, sequela (Left); and Patellar tendinosis (Right) on their pertinent problem list. Pain Assessment: Severity of Chronic pain is reported as a 7 /10. Location: Knee Left, Right/denies. Onset: More than a month ago. Quality: [place]. Timing: Constant. Modifying factor(s): medication. Vitals:  height is _0  (1.778 m) and  weight is 240 lb (108.9 kg). His temporal temperature is 97.8 F (36.6 C). His blood pressure is 140/89 and his pulse is 84. His respiration is 16 and oxygen saturation is 97%.    The patient indicates doing well with the current medication regimen. No adverse reactions or side effects reported to the medications.  The patient appears to be doing well with the But[place] patch and we do not need to make any adjustments to the medication.  We will schedule him to return for a left-sided genicular nerve radiofrequency ablation.  After we do the left side, then we will bring him back 2 weeks later for the right side.  He should have enough patches now to last until 05/19/2020.  Today we will get a UDS to document the buprenorphine patch. I had previously written a prescription for buprenorphine 10 mcg/h patch q. 7 days but there was an issue where the patient had to be transferred to a facility and he had another patch prescribed to him by the provider taking care of him at the time.  This created significant confusion when evaluating the PMP.  Last UDS done on 07/10/2018.  Pharmacotherapy Assessment   Analgesic: No opioid analgesics prescribed by our practice.  Buprenorphine 10 mcg/h patch q. 7 days.  This apparently has been taken over by Leonard Downing, PA. MME/day: 0 mg/day.   Monitoring: [place] PMP: PDMP reviewed during this encounter.       Pharmacotherapy: No side-effects or adverse reactions reported. Compliance: No problems identified. Effectiveness: Clinically acceptable.  No notes on file  UDS:  Summary  Date Value Ref Range Status  07/10/2018 FINAL  Final    Comment:    ==================================================================== [facility] COMP DRUG ANALYSIS,UR ==================================================================== Test  Result       Flag       Units Drug Present and Declared for Prescription Verification   Morphine                        [phone]       EXPECTED   ng/mg creat   Normorphine                    221          EXPECTED   ng/mg creat    Potential sources of large amounts of morphine in the absence of    codeine include administration of morphine or use of heroin.    Normorphine is an expected metabolite of morphine.   Hydromorphone                  88           EXPECTED   ng/mg creat    Hydromorphone may be present as a metabolite of morphine;    concentrations of hydromorphone rarely exceed 5% of the morphine    concentration when this is the source of hydromorphone.   Oxycodone                      1094         EXPECTED   ng/mg creat   Oxymorphone                    1028         EXPECTED   ng/mg creat   Noroxycodone                   3205         EXPECTED   ng/mg creat   Noroxymorphone                 323          EXPECTED   ng/mg creat    Sources of oxycodone are scheduled prescription medications.    Oxymorphone, noroxycodone, and noroxymorphone are expected    metabolites of oxycodone. Oxymorphone is also available as a    scheduled prescription medication.   Duloxetine                     PRESENT      EXPECTED   Quetiapine                     PRESENT      EXPECTED   Acetaminophen                  PRESENT      EXPECTED Drug Present not Declared for Prescription Verification   Chlorpheniramine               PRESENT      UNEXPECTED   Hydroxyzine                    PRESENT      UNEXPECTED Drug Absent but Declared for Prescription Verification   Pregabalin                     Not Detected UNEXPECTED   Salicylate                     Not Detected UNEXPECTED    Aspirin, as indicated in the declared medication  list, is not    always detected even when used as directed. ==================================================================== Test                      Result    Flag   Units      Ref Range   Creatinine              94               mg/dL       >=20 ==================================================================== Declared Medications:  The flagging and interpretation on this report are based on the  following declared medications.  Unexpected results may arise from  inaccuracies in the declared medications.  **Note: The testing scope of this panel includes these medications:  Duloxetine  Morphine (Morphine Sulfate)  Oxycodone (Oxycodone Acetaminophen)  Pregabalin  Quetiapine  **Note: The testing scope of this panel does not include small to  moderate amounts of these reported medications:  Acetaminophen (Oxycodone Acetaminophen)  Aspirin  **Note: The testing scope of this panel does not include following  reported medications:  Amlodipine (Norvasc)  Doxycycline  Fenofibrate  Iron (Ferrous Sulfate)  Meloxicam  Multivitamin  Pantoprazole (Protonix)  Polyethylene Glycol  Prednisolone  Sennosides  Timolol  Umeclidinium (Anoro Ellipta)  Vilanterol (Anoro Ellipta) ==================================================================== For clinical consultation, please call [phone]. ====================================================================      ROS  Constitutional: Denies any fever or chills Gastrointestinal: No reported hemesis, hematochezia, vomiting, or acute GI distress Musculoskeletal: Denies any acute onset joint swelling, redness, loss of ROM, or weakness Neurological: No reported episodes of acute onset apraxia, aphasia, dysarthria, agnosia, amnesia, paralysis, loss of coordination, or loss of consciousness  Medication Review  DULoxetine, albuterol, amLODipine, aspirin EC, buprenorphine, diclofenac sodium, fenofibrate, ferrous sulfate, multivitamin with minerals, pantoprazole, risperiDONE, timolol, traZODone, and umeclidinium-vilanterol  History Review  Allergy: Aaron Gibbs is allergic to azithromycin. Drug: Aaron Gibbs  reports no history of drug use. Alcohol:  reports current alcohol  use. Tobacco:  reports that he has quit smoking. He quit after 15.00 years of use. His smokeless tobacco use includes chew. Social: Aaron Gibbs  reports that he has quit smoking. He quit after 15.00 years of use. His smokeless tobacco use includes chew. He reports current alcohol use. He reports that he does not use drugs. Medical:  has a past medical history of Anxiety, Cancer ([place]), COPD (chronic obstructive pulmonary disease) ([place]), Glaucoma, and Osteoporosis. Surgical: Aaron Gibbs  has a past surgical history that includes Cholecystectomy and Transurethral resection of prostate (N/A, 2008). Family: family history includes Depression in his mother; Heart disease in his father.  Laboratory Chemistry Profile   Renal Lab Results  Component Value Date   BUN 15 12/26/2019   CREATININE 1.07 12/26/2019   LABCREA 631 12/13/2019   BCR 28 (H) 07/10/2018   GFRAA >60 12/26/2019   GFRNONAA >60 12/26/2019     Hepatic Lab Results  Component Value Date   AST 24 12/26/2019   ALT 20 12/26/2019   ALBUMIN 4.2 12/26/2019   ALKPHOS 51 12/26/2019   LIPASE 24 06/04/2018   AMMONIA 25 09/13/2019     Electrolytes Lab Results  Component Value Date   NA 139 12/26/2019   K 4.1 12/26/2019   CL 104 12/26/2019   CALCIUM 9.7 12/26/2019   MG 2.3 04/02/2019   PHOS 3.4 03/30/2019     Bone Lab Results  Component Value Date   VD25OH 26 08/15/2016   25OHVITD1 34 07/10/2018   25OHVITD2 <1.0 07/10/2018  25OHVITD3 34 07/10/2018     Inflammation (CRP: Acute Phase) (ESR: Chronic Phase) Lab Results  Component Value Date   CRP 5 07/10/2018   ESRSEDRATE 20 07/10/2018   LATICACIDVEN 1.1 12/13/2019       Note: Above Lab results reviewed.  Recent Imaging Review  CT Chest Wo Contrast CLINICAL DATA:  Shortness of breath, abnormal chest x-ray  EXAM: CT CHEST WITHOUT CONTRAST  TECHNIQUE: Multidetector CT imaging of the chest was performed following the standard protocol without IV  contrast.  COMPARISON:  Chest x-ray 12/26/2019, CT 12/07/2019  FINDINGS: Cardiovascular: Mild cardiomegaly. No pericardial effusion. Ascending thoracic aorta is mildly ectatic without aneurysmal dilation. Atherosclerotic calcifications of the aorta and coronary arteries.  Mediastinum/Nodes: Mildly enlarged lower right paratracheal lymph node measuring 10 mm short axis has slightly decreased from prior (series 2, image 53), previously 12 mm. No new or enlarging mediastinal lymph nodes. No evidence of axillary or hilar lymphadenopathy. Stable 1.5 cm right thyroid lobe nodule Recommend thyroid ultrasound (ref: J Am Coll Radiol. 2015 Feb;12(2): 143-50). Trachea and esophagus within normal limits.  Lungs/Pleura: Irregular nodular density within the lingula measuring 16 x 13 mm (series 3, image 80) appears slightly decreased in size compared to CT 12/07/2019. Persistent streaky ill-defined opacities adjacent to this nodular density. There are a few 2-3 mm subpleural nodular densities within the lingula, unchanged from prior. Lung fields are otherwise clear. No pleural effusion or pneumothorax.  Upper Abdomen: No acute findings within the visualized upper abdomen.  Musculoskeletal: Markedly exaggerated thoracic kyphosis with ankylosis of the visualized cervical and thoracic vertebral levels as well as ossification of the anterior longitudinal ligament. No new or acute osseous findings are identified.  IMPRESSION: 1. Irregular nodular density within the lingula measuring 16 x 13 mm appears slightly decreased in size compared to CT 12/07/2019 (previously measured 17 x 16 mm). Although this finding could represent an infectious or inflammatory process, malignancy is not excluded and continued surveillance is recommended with short-term follow-up CT in 3 months. 2. Right lung is clear. No findings to correspond with previously described abnormality seen on chest radiograph. 3. Stable  1.5 cm right thyroid lobe nodule. Recommend thyroid ultrasound (ref: J Am Coll Radiol. 4. Aortic Atherosclerosis (ICD10-I70.0).  Electronically Signed   By: Davina Poke D.O.   On: 12/26/2019 12:55 DG Chest 2 View CLINICAL DATA:  Shortness of breath  EXAM: CHEST - 2 VIEW  COMPARISON:  12/13/2019  FINDINGS: The patient is rotated to the right on today's radiograph, reducing diagnostic sensitivity and specificity.  Indistinct left pulmonary nodule, lung cancer not excluded. There is some vague opacity around this nodule mildly obscuring its margins.  Indistinct nodular density at the right costophrenic angle, nonspecific. This could be from localized airspace opacity or another true pulmonary nodule.  No blunting of the lateral costophrenic angles. Upper normal heart size. Thoracic kyphosis and spondylosis.  IMPRESSION: 1. Indistinct left basilar pulmonary nodule, lung cancer not excluded. This is slightly less sharply defined than on the prior exam, possibly due to surrounding airspace opacity 2. New nodular density at the right costophrenic angle, nonspecific. 3. Thoracic kyphosis and spondylosis. 4. No overt edema or pleural effusion.  Electronically Signed   By: Van Clines M.D.   On: 12/26/2019 10:51 Note: Reviewed        Physical Exam  General appearance: Well nourished, well developed, and well hydrated. In no apparent acute distress Mental status: Alert, oriented x 3 (person, place, & time)  Respiratory: No evidence of acute respiratory distress Eyes: PERLA Vitals: BP 140/89 (BP Location: Right Arm, Patient Position: Sitting, Cuff Size: Large)   Pulse 84   Temp 97.8 F (36.6 C) (Temporal)   Resp 16   Ht _0  (1.778 m)   Wt 240 lb (108.9 kg)   SpO2 97%   BMI 34.44 kg/m  BMI: Estimated body mass index is 34.44 kg/m as calculated from the following:   Height as of this encounter: _1  (1.778 m).   Weight as of this encounter: 240 lb  (108.9 kg). Ideal: Ideal body weight: 73 kg (160 lb 15 oz) Adjusted ideal body weight: 87.3 kg (192 lb 9 oz)  Assessment   Status Diagnosis  Controlled Controlled Controlled 1. Chronic pain syndrome   2. Chronic knee pain (Primary Area of Pain) (Bilateral) (L>R)   3. Osteoarthritis of knee (Bilateral)   4. Tricompartment osteoarthritis of knee (Left)   5. Medial meniscal tear, sequela (Left)   6. Lateral meniscal tear, sequela (Left)   7. DISH (diffuse idiopathic skeletal hyperostosis)   8. Pharmacologic therapy      Updated Problems: No problems updated.  Plan of Care  Problem-specific:  No problem-specific Assessment & Plan notes found for this encounter.  Aaron Gibbs has a current medication list which includes the following long-term medication(s): albuterol, amlodipine, [START ON 02/25/2020] buprenorphine, [START ON 03/24/2020] buprenorphine, [START ON 04/21/2020] buprenorphine, duloxetine, fenofibrate, ferrous sulfate, pantoprazole, risperidone, and trazodone.  Pharmacotherapy (Medications Ordered): Meds ordered this encounter  Medications  . buprenorphine (BUTRANS) 10 MCG/HR PTWK    Sig: Place 1 patch onto the skin once a week for 28 days. Apply only 1 patch at a time and alternate sites weekly.    Dispense:  4 patch    Refill:  0    Chronic Pain: STOP Act (Not applicable) Fill 1 day early if closed on refill date. Do not fill until: 02/25/2020. To last until: 03/24/2020.  . buprenorphine (BUTRANS) 10 MCG/HR PTWK    Sig: Place 1 patch onto the skin once a week for 28 days. Apply only 1 patch at a time and alternate sites weekly.    Dispense:  4 patch    Refill:  0    Chronic Pain: STOP Act (Not applicable) Fill 1 day early if closed on refill date. Do not fill until: 03/24/2020. To last until: 04/21/2020.  . buprenorphine (BUTRANS) 10 MCG/HR PTWK    Sig: Place 1 patch onto the skin once a week for 28 days. Apply only 1 patch at a time and alternate sites weekly.     Dispense:  4 patch    Refill:  0    Chronic Pain: STOP Act (Not applicable) Fill 1 day early if closed on refill date. Do not fill until: 04/21/2020. To last until: 05/19/2020.   Orders:  Orders Placed This Encounter  Procedures  . Radiofrequency,Genicular    Standing Status:   Future    Standing Expiration Date:   02/01/2021    Scheduling Instructions:     Side(s): Left Knee     Level(s): Superior-Lateral, Superior-Medial, and Inferior-Medial Genicular Nerve(s)     Sedation: Patient's choice.     Scheduling Timeframe: As soon as pre-approved    Order Specific Question:   Where will this procedure be performed?    Answer:   [facility]  . ToxASSURE Select 13 (MW), Urine    Volume: 30 ml(s). Minimum 3 ml of urine is needed. Document temperature  of fresh sample. Indications: Long term (current) use of opiate analgesic [id number])    Order Specific Question:   Release to patient    Answer:   Immediate   Follow-up plan:   Return in about 4 months (around 05/19/2020) for Radio-Frequency: (L) G genicular nerve RFA #2.      Interventional management options: Considering: Palliative treatments seen below.   PRN Procedures: Palliative bilateral IA Hyalgan knee injections S3/N1  Palliative bilateral IA knee injections (w/ steroids) #1  Diagnostic bilateral genicular NB #2  Palliative bilateral genicular nerve RFA #2 (last done 06/10/2019)    Recent Visits Date Type Provider Dept  12/31/19 Office Visit Milinda Pointer, MD [facility]  Showing recent visits within past 90 days and meeting all other requirements Today's Visits Date Type Provider Dept  02/02/20 Office Visit Milinda Pointer, MD [facility]  Showing today's visits and meeting all other requirements Future Appointments No visits were found meeting these conditions. Showing future appointments within next 90 days and meeting all other requirements  I discussed the assessment and  treatment plan with the patient. The patient was provided an opportunity to ask questions and all were answered. The patient agreed with the plan and demonstrated an understanding of the instructions.  Patient advised to call back or seek an in-person evaluation if the symptoms or condition worsens.  Duration of encounter: 30 minutes.  Note by: Aaron Cola, MD Date: 02/02/2020; Time: 12:59 PM

## 2020-02-02 NOTE — Patient Instructions (Addendum)
____________________________________________________________________________________________  Preparing for Procedure with Sedation  Procedure appointments are limited to planned procedures: . No Prescription Refills. . No disability issues will be discussed. . No medication changes will be discussed.  Instructions: . Oral Intake: Do not eat or drink anything for at least 8 hours prior to your procedure. (Exception: Blood Pressure Medication. See below.) . Transportation: Unless otherwise stated by your physician, you may drive yourself after the procedure. . Blood Pressure Medicine: Do not forget to take your blood pressure medicine with a sip of water the morning of the procedure. If your Diastolic (lower reading)is above 100 mmHg, elective cases will be cancelled/rescheduled. . Blood thinners: These will need to be stopped for procedures. Notify our staff if you are taking any blood thinners. Depending on which one you take, there will be specific instructions on how and when to stop it. . Diabetics on insulin: Notify the staff so that you can be scheduled 1st case in the morning. If your diabetes requires high dose insulin, take only  of your normal insulin dose the morning of the procedure and notify the staff that you have done so. . Preventing infections: Shower with an antibacterial soap the morning of your procedure. . Build-up your immune system: Take 1000 mg of Vitamin C with every meal (3 times a day) the day prior to your procedure. . Antibiotics: Inform the staff if you have a condition or reason that requires you to take antibiotics before dental procedures. . Pregnancy: If you are pregnant, call and cancel the procedure. . Sickness: If you have a cold, fever, or any active infections, call and cancel the procedure. . Arrival: You must be in the facility at least 30 minutes prior to your scheduled procedure. . Children: Do not bring children with you. . Dress appropriately:  Bring dark clothing that you would not mind if they get stained. . Valuables: Do not bring any jewelry or valuables.  Reasons to call and reschedule or cancel your procedure: (Following these recommendations will minimize the risk of a serious complication.) . Surgeries: Avoid having procedures within 2 weeks of any surgery. (Avoid for 2 weeks before or after any surgery). . Flu Shots: Avoid having procedures within 2 weeks of a flu shots or . (Avoid for 2 weeks before or after immunizations). . Barium: Avoid having a procedure within 7-10 days after having had a radiological study involving the use of radiological contrast. (Myelograms, Barium swallow or enema study). . Heart attacks: Avoid any elective procedures or surgeries for the initial 6 months after a "Myocardial Infarction" (Heart Attack). . Blood thinners: It is imperative that you stop these medications before procedures. Let us know if you if you take any blood thinner.  . Infection: Avoid procedures during or within two weeks of an infection (including chest colds or gastrointestinal problems). Symptoms associated with infections include: Localized redness, fever, chills, night sweats or profuse sweating, burning sensation when voiding, cough, congestion, stuffiness, runny nose, sore throat, diarrhea, nausea, vomiting, cold or Flu symptoms, recent or current infections. It is specially important if the infection is over the area that we intend to treat. . Heart and lung problems: Symptoms that may suggest an active cardiopulmonary problem include: cough, chest pain, breathing difficulties or shortness of breath, dizziness, ankle swelling, uncontrolled high or unusually low blood pressure, and/or palpitations. If you are experiencing any of these symptoms, cancel your procedure and contact your primary care physician for an evaluation.  Remember:  Regular Business hours are:    Monday to Thursday 8:00 AM to 4:00 PM  Provider's  Schedule: Milinda Pointer, MD:  Procedure days: Tuesday and Thursday 7:30 AM to 4:00 PM  Gillis Santa, MD:  Procedure days: Monday and Wednesday 7:30 AM to 4:00 PM ____________________________________________________________________________________________   ____________________________________________________________________________________________  Genicular Nerve Block  What is a genicular nerve block? A genicular nerve block is the injection of a local anesthetic to block the nerves that transmits pain from the knee.  What is the purpose of a facet nerve block? A genicular nerve block is a diagnostic procedure to determine if the pathologic changes (i.e. arthritis, meniscal tears, etc) and inflammation within the knee joint is the source of your knee pain. It also confirms that the knee pain will respond well to the actual treatment procedure. If a genicular nerve block works, it will give you relief for several hours. After that, the pain is expected to return to normal. This test is always performed twice (usually a week or two apart) because two successful tests are required to move onto treatment. If both diagnostic tests are positive, then we schedule a treatment called radiofrequency (RF) ablation. In this procedure, the same nerves are cauterized, which typically leads to pain relief for 4 -18 months. If this process works well for one knee, it can be performed on the other knee if needed.  How is the procedure performed? You will be placed on the procedure table. The injection site is sterilized with either iodine or chlorhexadine. The site to be injected is numbed with a local anesthetic, and a needle is directed to the target area. X-ray guidance is used to ensure proper placement and positioning of the needle. When the needle is properly positioned near the genicular nerve, local anesthetic is injected to numb that nerve. This will be repeated at multiple sites around the knee to  block all genicular nerves.  Will the procedure be painful? The injection can be painful and we therefore provide the option of receiving IV sedation. IV sedation, combined with local anesthetic, can make the injection nearly pain free. It allows you to remain very still during the procedure, which can also make the injection easier, faster, and more successful. If you decide to have IV sedation, you must have a driver to get you home safely afterwards. In addition, you cannot have anything to eat or drink within 8 hours of your appointment (clear liquids are allowed until 3 hours before the procedure). If you take medications for diabetes, these medications may need to be adjusted the morning of the procedure. Your primary care physician can help you with this adjustment.  What are the discharge instructions? If you received IV sedation do not drive or operate machinery for at least 24 hours after the procedure. You may return to work the next day following your procedure. You may resume your normal diet immediately. Do not engage in any strenuous activity for 24 hours. You should, however, engage in moderate activity that typically causes your ususal pain. If the block works, those activities should not be painful for several hours after the injection. Do not take a bath, swim, or use a hot tub for 24 hours (you may take a shower). Call the office if you have any of the following: severe pain afterwards (different than your usual symptoms), redness/swelling/discharge at the injection site(s), fevers/chills, difficulty with bowel or bladder functions.  What are the risks and side effects? The complication rate for this procedure is very low. Whenever a needle  enters the skin, bleeding or infection can occur. Some other serious but extremely rare risks include paralysis and death. You may have an allergic reaction to any of the medications used. If you have a known allergy to any medications, especially  local anesthetics, notify our staff before the procedure takes place. You may experience any of the following side effects up to 4 - 6 hours after the procedure: . Leg muscle weakness or numbness may occur due to the local anesthetic affecting the nerves that control your legs (this is a temporary affect and it is not paralysis). If you have any leg weakness or numbness, walk only with assistance in order to prevent falls and injury. Your leg strength will return slowly and completely. . Dizziness may occur due to a decrease in your blood pressure. If this occurs, remain in a seated or lying position. Gradually sit up, and then stand after at least 10 minutes of sitting. . Mild headaches may occur. Drink fluids and take pain medications if needed. If the headaches persist or become severe, call the office. . Mild discomfort at the injection site can occur. This typically lasts for a few hours but can persist for a couple days. If this occurs, take anti-inflammatories or pain medications, apply ice to the area the day of the procedure. If it persists, apply moist heat in the day(s) following.  The side effects listed above can be normal. They are not dangerous and will resolve on their own. If, however, you experience any of the following, a complication may have occurred and you should either contact your doctor. If he is not readily available, then you should proceed to the closest urgent care center for evaluation: . Severe or progressive pain at the injection site(s) . Arm or leg weakness that progressively worsens or persists for longer than 8 hours . Severe or progressive redness, swelling, or discharge from the injections site(s) . Fevers, chills, nausea, or vomiting . Bowel or bladder dysfunction (i.e. inability to urinate or pass stool or difficulty controlling either)  How long does it take for the procedure to work? You should feel relief from your usual pain within the first hour. Again,  this is only expected to last for several hours, at the most. Remember, you may be sore in the middle part of your back from the needles, and you must distinguish this from your usual pain. ____________________________________________________________________________________________   ___________________________________________________________________________________________  Post-Radiofrequency (RF) Discharge Instructions  You have just completed a Radiofrequency Neurotomy.  The following instructions will provide you with information and guidelines for self-care upon discharge.  If at any time you have questions or concerns please call your physician. DO NOT DRIVE YOURSELF!!  Instructions:  Apply ice: Fill a plastic sandwich bag with crushed ice. Cover it with a small towel and apply to injection site. Apply for 15 minutes then remove x 15 minutes. Repeat sequence on day of procedure, until you go to bed. The purpose is to minimize swelling and discomfort after procedure.  Apply heat: Apply heat to procedure site starting the day following the procedure. The purpose is to treat any soreness and discomfort from the procedure.  Food intake: No eating limitations, unless stipulated above.  Nevertheless, if you have had sedation, you may experience some nausea.  In this case, it may be wise to wait at least two hours prior to resuming regular diet.  Physical activities: Keep activities to a minimum for the first 8 hours after the procedure. For the  first 24 hours after the procedure, do not drive a motor vehicle,  Operate heavy machinery, power tools, or handle any weapons.  Consider walking with the use of an assistive device or accompanied by an adult for the first 24 hours.  Do not drink alcoholic beverages including beer.  Do not make any important decisions or sign any legal documents. Go home and rest today.  Resume activities tomorrow, as tolerated.  Use caution in moving about as you may  experience mild leg weakness.  Use caution in cooking, use of household electrical appliances and climbing steps.  Driving: If you have received any sedation, you are not allowed to drive for 24 hours after your procedure.  Blood thinner: Restart your blood thinner 6 hours after your procedure. (Only for those taking blood thinners)  Insulin: As soon as you can eat, you may resume your normal dosing schedule. (Only for those taking insulin)  Medications: May resume pre-procedure medications.  Do not take any drugs, other than what has been prescribed to you.  Infection prevention: Keep procedure site clean and dry.  Post-procedure Pain Diary: Extremely important that this be done correctly and accurately. Recorded information will be used to determine the next step in treatment.  Pain evaluated is that of treated area only. Do not include pain from an untreated area.  Complete every hour, on the hour, for the initial 8 hours. Set an alarm to help you do this part accurately.  Do not go to sleep and have it completed later. It will not be accurate.  Follow-up appointment: Keep your follow-up appointment after the procedure. Usually 2-6 weeks after radiofrequency. Bring you pain diary. The information collected will be essential for your long-term care.   Expect:  From numbing medicine (AKA: Local Anesthetics): Numbness or decrease in pain.  Onset: Full effect within 15 minutes of injected.  Duration: It will depend on the type of local anesthetic used. On the average, 1 to 8 hours.   From steroids (when added): Decrease in swelling or inflammation. Once inflammation is improved, relief of the pain will follow.  Onset of benefits: Depends on the amount of swelling present. The more swelling, the longer it will take for the benefits to be seen. In some cases, up to 10 days.  Duration: Steroids will stay in the system x 2 weeks. Duration of benefits will depend on multiple posibilities  including persistent irritating factors.  From procedure: Some discomfort is to be expected once the numbing medicine wears off. In the case of radiofrequency procedures, this may last as long as 6 weeks. Additional post-procedure pain medication is provided for this. Discomfort is minimized if ice and heat are applied as instructed.  Call if:  You experience numbness and weakness that gets worse with time, as opposed to wearing off.  He experience any unusual bleeding, difficulty breathing, or loss of the ability to control your bowel and bladder. (This applies to Spinal procedures only)  You experience any redness, swelling, heat, red streaks, elevated temperature, fever, or any other signs of a possible infection.  Emergency Numbers:  [facility] hours (Monday - Thursday, 8:00 AM - 4:00 PM) (Friday, 9:00 AM - 12:00 Noon): [phone]  After hours: [phone] ____________________________________________________________________________________________    Radiofrequency Lesioning Radiofrequency lesioning is a procedure that is performed to relieve pain. The procedure is often used for back, neck, or arm pain. Radiofrequency lesioning involves the use of a machine that creates radio waves to make heat. During  the procedure, the heat is applied to the nerve that carries the pain signal. The heat damages the nerve and interferes with the pain signal. Pain relief usually starts about 2 weeks after the procedure and lasts for 6 months to 1 year. You will be awake during the procedure. You will need to be able to talk with the health care provider during the procedure. Tell a health care provider about:  Any allergies you have.  All medicines you are taking, including vitamins, herbs, eye drops, creams, and over-the-counter medicines.  Any problems you or family members have had with anesthetic medicines.  Any blood disorders you have.  Any surgeries you have had.  Any  medical conditions you have or have had.  Whether you are pregnant or may be pregnant. What are the risks? Generally, this is a safe procedure. However, problems may occur, including:  Pain or soreness at the injection site.  Allergic reaction to medicines given during the procedure.  Bleeding.  Infection at the injection site.  Damage to nerves or blood vessels. What happens before the procedure? Staying hydrated Follow instructions from your health care provider about hydration, which may include:  Up to 2 hours before the procedure - you may continue to drink clear liquids, such as water, clear fruit juice, black coffee, and plain tea. Eating and drinking Follow instructions from your health care provider about eating and drinking, which may include:  8 hours before the procedure - stop eating heavy meals or foods, such as meat, fried foods, or fatty foods.  6 hours before the procedure - stop eating light meals or foods, such as toast or cereal.  6 hours before the procedure - stop drinking milk or drinks that contain milk.  2 hours before the procedure - stop drinking clear liquids. Medicines Ask your health care provider about:  Changing or stopping your regular medicines. This is especially important if you are taking diabetes medicines or blood thinners.  Taking medicines such as aspirin and ibuprofen. These medicines can thin your blood. Do not take these medicines unless your health care provider tells you to take them.  Taking over-the-counter medicines, vitamins, herbs, and supplements. General instructions  Plan to have someone take you home from the hospital or clinic.  If you will be going home right after the procedure, plan to have someone with you for 24 hours.  Ask your health care provider what steps will be taken to help prevent infection. These may include: ? Removing hair at the procedure site. ? Washing skin with a germ-killing soap. ? Taking  antibiotic medicine. What happens during the procedure?   An IV will be inserted into one of your veins.  You will be given one or more of the following: ? A medicine to help you relax (sedative). ? A medicine to numb the area (local anesthetic).  Your health care provider will insert a radiofrequency needle into the area to be treated. This is done with the help of a type of X-ray (fluoroscopy).  A wire that carries the radio waves (electrode) will be put through the radiofrequency needle.  An electrical pulse will be sent through the electrode to verify the correct nerve that is causing your pain. You will feel a tingling sensation, and you may have muscle twitching.  The tissue around the needle tip will be heated by an electric current that comes from the radiofrequency machine. This will numb the nerves.  The needle will be removed.  A  bandage (dressing) will be put on the insertion area. The procedure may vary among health care providers and hospitals. What happens after the procedure?  Your blood pressure, heart rate, breathing rate, and blood oxygen level will be monitored until you leave the hospital or clinic.  Return to your normal activities as told by your health care provider. Ask your health care provider what activities are safe for you.  Do not drive for 24 hours if you were given a sedative during your procedure. Summary  Radiofrequency lesioning is a procedure that is performed to relieve pain. The procedure is often used for back, neck, or arm pain.  Radiofrequency lesioning involves the use of a machine that creates radio waves to make heat.  Plan to have someone take you home from the hospital or clinic. Do not drive for 24 hours if you were given a sedative during your procedure.  Return to your normal activities as told by your health care provider. Ask your health care provider what activities are safe for you. This information is not intended to replace  advice given to you by your health care provider. Make sure you discuss any questions you have with your health care provider. Document Revised: 02/21/2018 Document Reviewed: 02/21/2018 Elsevier Patient Education  [place].

## 2020-02-03 ENCOUNTER — Telehealth: Payer: Self-pay

## 2020-02-03 NOTE — Telephone Encounter (Signed)
Copied from [place] [id number]. Topic: General - Other >> Feb 03, 2020  2:04 PM Keene Breath wrote: Reason for CRM: Patient's daughter would like the doctor to call regarding her father's stay in the hospital.  She would like a call today to discuss some issues.  Please call [name] at [phone]

## 2020-02-03 NOTE — Telephone Encounter (Signed)
Returned call to patient's daughter, Aaron Gibbs, discussed the patient's case.  She briefly reviewed that he has had complicated course over past several weeks to months, he has been to hospital, skilled nursing rehab, psychiatric inpatient and other treatments for variety of issues including his mental health depression.  He has returned home recently and is still expressing severe depression and mental health concerns, he has expressed a suicidal ideation by her report.  She is reporting this to me to share information to help him when I see him tomorrow on 02/04/20 for his hospital follow-up visit.  He will be accompanied by his caregiver Aaron Gibbs.  Aaron Gibbs reassures me today that all safety precautions are in place. He has 24 hour caregiver support. There are no weapons or firearms or anything harmful in the home.  She is hoping to get him established with outpatient psychiatry to help manage his mental health, rather than going back to inpatient psychiatry again which has proven to be unsuccessful.  I advised her that I appreciate her concern and her insight into the recent course. I agree that outpatient psych would be good option for him as long as all safety precautions with 24 hour caregiver are in place at home in the interval.  We have tried determining location that we can get him established with in past but seems to be unsuccessful.  I will reach out to Aaron Gibbs as well with our CCM Team, she has spoken to patient before about this and has upcoming call in 1-2 weeks.  Aaron Putnam, DO [facility] 02/03/2020, 6:20 PM

## 2020-02-04 ENCOUNTER — Ambulatory Visit (INDEPENDENT_AMBULATORY_CARE_PROVIDER_SITE_OTHER): Payer: Medicare Other | Admitting: Family Medicine

## 2020-02-04 ENCOUNTER — Other Ambulatory Visit: Payer: Self-pay

## 2020-02-04 ENCOUNTER — Telehealth: Payer: Self-pay | Admitting: Licensed Clinical Social Worker

## 2020-02-04 ENCOUNTER — Encounter: Payer: Self-pay | Admitting: Family Medicine

## 2020-02-04 VITALS — BP 120/56 | HR 72 | Temp 96.6°F | Resp 16 | Ht 70.0 in | Wt 204.6 lb

## 2020-02-04 DIAGNOSIS — G894 Chronic pain syndrome: Secondary | ICD-10-CM | POA: Diagnosis not present

## 2020-02-04 DIAGNOSIS — F333 Major depressive disorder, recurrent, severe with psychotic symptoms: Secondary | ICD-10-CM | POA: Diagnosis not present

## 2020-02-04 DIAGNOSIS — I70209 Unspecified atherosclerosis of native arteries of extremities, unspecified extremity: Secondary | ICD-10-CM | POA: Diagnosis not present

## 2020-02-04 DIAGNOSIS — I700 Atherosclerosis of aorta: Secondary | ICD-10-CM

## 2020-02-04 DIAGNOSIS — F410 Panic disorder [episodic paroxysmal anxiety] without agoraphobia: Secondary | ICD-10-CM | POA: Insufficient documentation

## 2020-02-04 DIAGNOSIS — F411 Generalized anxiety disorder: Secondary | ICD-10-CM | POA: Insufficient documentation

## 2020-02-04 MED ORDER — LORAZEPAM 0.5 MG PO TABS: 0.5000 mg | ORAL_TABLET | Freq: Four times a day (QID) | ORAL | 0 refills | Status: DC | PRN

## 2020-02-04 MED ORDER — TRAZODONE HCL 100 MG PO TABS: 100.0000 mg | ORAL_TABLET | Freq: Every day | ORAL | 1 refills | Status: DC

## 2020-02-04 MED ORDER — HYDROXYZINE HCL 25 MG PO TABS: 25.0000 mg | ORAL_TABLET | Freq: Four times a day (QID) | ORAL | 2 refills | Status: DC

## 2020-02-04 NOTE — Patient Instructions (Addendum)
Thank you for coming to the office today.  Updated med list  STOP Hydrochlorothiazide 25mg  for BP Continue Amlodipine 10mg  daily  Trazodone should now be 100mg   - once nightly, can stop the 50mg  tablets.  Stop Venlafaxine Stop Wellbutrin (bupropion)  Continue Duloxetine 20mg  tablets x 4 daily in AM = 80mg  total dose daily STOP taking duloxetine 60mg   Hydroxyzine 25mg  4 times a day  Lorazepam 0.5mg  take up to 3 to 4 times a day as needed for anxiety, can take every 6 hours. 30 day supply for now.  Let me know when you locate a Psychiatry location that you would like to go to and that is covered, we can send referral.  [facility] / [facility] Address: [address], [place], [place] [address] [email or web address] Phone: [phone]  [facility] [address], [place] [address] Johnnette Litter P: [place] ([facility]) Address: [address], [place], [place] [address] Hours: 8:30AM-5PM Phone: [phone]  [facility] [place], [place] [address] Phone: [phone]  Cephus Shelling, MD [place], [place] [address] Phone: [phone]  [facility] (All ages) [address] [place], [id number] Phone: [phone] (Option 1) [email or web address]  [facility]) [place] [address] [place], [place] [address] Phone: [phone]  [employer], available walk-in 9am-4pm M-F [place], [place] [address] Hours: [place] (M-F, walk in available) [phone] [place]   Address: [place], [place],  [address] Hours: 8AM-5PM (accepts walk in to establish) Phone: [phone]    Please schedule a Follow-up Appointment to: Return in about 4 weeks (around 03/03/2020), or if symptoms worsen or fail to improve, for Follow-up 4 weeks can do virtual Depression / Anxiety refills, f/u psych.  If you have any other questions or concerns, please feel free to call the office or send a message through [facility]. You may also schedule an earlier appointment if necessary.  Additionally, you may be receiving a survey about your experience at our office within a few days to 1 week by e-mail or mail. We value your feedback.  [name], DO [facility]

## 2020-02-04 NOTE — Telephone Encounter (Signed)
  Care Coordination Note   02/04/2020 Name: Aaron Gibbs MRN: [id number] DOB: 1935/03/31  Aaron Gibbs is a 85 y.o. year old male who is a primary care patient of Olin Hauser, DO. The CCM team was consulted for assistance with Mental Health Counseling and Resources.   Review of patient status, including review of consultants reports, relevant laboratory and other test results, and collaboration with appropriate care team members and the patient's provider was performed as part of comprehensive patient evaluation and provision of chronic care management services.    Outpatient Encounter Medications as of 02/04/2020  Medication Sig  . albuterol (VENTOLIN HFA) 108 (90 Base) MCG/ACT inhaler INHALE 1-2 PUFFS INTO THE LUNGS EVERY 6 HOURS AS NEEDED (Patient taking differently: Inhale 1-2 puffs into the lungs every 6 (six) hours as needed for wheezing or shortness of breath. )  . amLODipine (NORVASC) 10 MG tablet Take 1 tablet (10 mg total) by mouth daily.  Marland Kitchen aspirin EC 81 MG tablet Take 81 mg by mouth daily.  Derrill Memo ON 02/25/2020] buprenorphine (BUTRANS) 10 MCG/HR PTWK Place 1 patch onto the skin once a week for 28 days. Apply only 1 patch at a time and alternate sites weekly.  Derrill Memo ON 03/24/2020] buprenorphine (BUTRANS) 10 MCG/HR PTWK Place 1 patch onto the skin once a week for 28 days. Apply only 1 patch at a time and alternate sites weekly.  Derrill Memo ON 04/21/2020] buprenorphine (BUTRANS) 10 MCG/HR PTWK Place 1 patch onto the skin once a week for 28 days. Apply only 1 patch at a time and alternate sites weekly.  . diclofenac sodium (VOLTAREN) 1 % GEL Apply 2 g topically daily as needed.   . DULoxetine (CYMBALTA) 20 MG capsule Take 4 capsules (80 mg total) by mouth daily.  . fenofibrate (TRICOR) 145 MG tablet Take 1 tablet (145 mg total) by mouth daily. (Patient taking differently: Take 145 mg by mouth at bedtime. )  . ferrous sulfate 325 (65 FE) MG tablet Take 325 mg by mouth  daily with breakfast.  . hydrOXYzine (ATARAX/VISTARIL) 25 MG tablet Take 1 tablet (25 mg total) by mouth in the morning, at noon, in the evening, and at bedtime.  Marland Kitchen LORazepam (ATIVAN) 0.5 MG tablet Take 1 tablet (0.5 mg total) by mouth every 6 (six) hours as needed for anxiety.  . Multiple Vitamin (MULTIVITAMIN WITH MINERALS) TABS tablet Take 1 tablet by mouth daily.  . pantoprazole (PROTONIX) 40 MG tablet Take 40 mg by mouth daily.   . QUEtiapine (SEROQUEL) 100 MG tablet Take 100 mg by mouth at bedtime.  . timolol (TIMOPTIC) 0.5 % ophthalmic solution Place 1 drop into both eyes 2 (two) times daily. Apply one drop to eye(s)  . traZODone (DESYREL) 100 MG tablet Take 1 tablet (100 mg total) by mouth at bedtime.  Marland Kitchen umeclidinium-vilanterol (ANORO ELLIPTA) 62.5-25 MCG/INH AEPB Inhale 1 puff into the lungs daily.   No facility-administered encounter medications on file as of 02/04/2020.   LCSW received notification from PCP regarding patient's need to find geropsychiatry care. LCSW sent email to PCP with resources on 02/04/20. LCSW rescheduled patient's CCM Social Work appointment from 02/16/20 to 02/05/20 to discuss this community resource need connection.  Follow Up Plan: SW will follow up with patient by phone tomorrow on 02/05/20.   Eula Fried, BSW, MSW, [email or web address] Phone: [phone]

## 2020-02-04 NOTE — Progress Notes (Signed)
Subjective:    Patient ID: Aaron Gibbs, male    DOB: 01/26/1935, 85 y.o.   MRN: [id number]  Aaron Gibbs is a 85 y.o. male presenting on 02/04/2020 for Hospitalization Follow-up, Depression, and Anxiety  Accompanied by caregiver, [place]  Had ED visit for UTI and fall. He was admitted to behavioral health and discharged to [facility] H&P and Discharge Summary have been reviewed Reviewed recent hospital records and discharge paperwork and med rec He has had worsening episode of suicidal ideation some related to medication and has severe depression anxiety  - Today reports overall has done fairly well since discharge but still endorsing severe depression anxiety - His family is trying to arrange outpatient psychiatry - They have increased caregiver support now with 24 hour coverage Requesting med rec today with all medications Need refills - Out of hydroxyzine and lorazepam   I have reviewed the discharge medication list, and have reconciled the current and discharge medications today.   Current Outpatient Medications:  .  albuterol (VENTOLIN HFA) 108 (90 Base) MCG/ACT inhaler, INHALE 1-2 PUFFS INTO THE LUNGS EVERY 6 HOURS AS NEEDED (Patient taking differently: Inhale 1-2 puffs into the lungs every 6 (six) hours as needed for wheezing or shortness of breath. ), Disp: 8.5 g, Rfl: 3 .  amLODipine (NORVASC) 10 MG tablet, Take 1 tablet (10 mg total) by mouth daily., Disp: 90 tablet, Rfl: 1 .  aspirin EC 81 MG tablet, Take 81 mg by mouth daily., Disp: , Rfl:  .  [START ON 04/21/2020] buprenorphine (BUTRANS) 10 MCG/HR PTWK, Place 1 patch onto the skin once a week for 28 days. Apply only 1 patch at a time and alternate sites weekly., Disp: 4 patch, Rfl: 0 .  DULoxetine (CYMBALTA) 20 MG capsule, Take 4 capsules (80 mg total) by mouth daily., Disp: 120 capsule, Rfl: 1 .  fenofibrate (TRICOR) 145 MG tablet, Take 1 tablet (145 mg total) by mouth daily.  (Patient taking differently: Take 145 mg by mouth at bedtime. ), Disp: 90 tablet, Rfl: 1 .  ferrous sulfate 325 (65 FE) MG tablet, Take 325 mg by mouth daily with breakfast., Disp: , Rfl:  .  Multiple Vitamin (MULTIVITAMIN WITH MINERALS) TABS tablet, Take 1 tablet by mouth daily., Disp: , Rfl:  .  pantoprazole (PROTONIX) 40 MG tablet, Take 40 mg by mouth daily. , Disp: , Rfl:  .  QUEtiapine (SEROQUEL) 100 MG tablet, Take 100 mg by mouth at bedtime., Disp: , Rfl:  .  timolol (TIMOPTIC) 0.5 % ophthalmic solution, Place 1 drop into both eyes 2 (two) times daily. Apply one drop to eye(s), Disp: , Rfl:  .  umeclidinium-vilanterol (ANORO ELLIPTA) 62.5-25 MCG/INH AEPB, Inhale 1 puff into the lungs daily., Disp: , Rfl:  .  [START ON 02/25/2020] buprenorphine (BUTRANS) 10 MCG/HR PTWK, Place 1 patch onto the skin once a week for 28 days. Apply only 1 patch at a time and alternate sites weekly., Disp: 4 patch, Rfl: 0 .  [START ON 03/24/2020] buprenorphine (BUTRANS) 10 MCG/HR PTWK, Place 1 patch onto the skin once a week for 28 days. Apply only 1 patch at a time and alternate sites weekly., Disp: 4 patch, Rfl: 0 .  diclofenac sodium (VOLTAREN) 1 % GEL, Apply 2 g topically daily as needed. , Disp: , Rfl:  .  hydrOXYzine (ATARAX/VISTARIL) 25 MG tablet, Take 1 tablet (25 mg total) by mouth in the morning, at noon, in the evening, and  at bedtime., Disp: 120 tablet, Rfl: 2 .  LORazepam (ATIVAN) 0.5 MG tablet, Take 1 tablet (0.5 mg total) by mouth every 6 (six) hours as needed for anxiety., Disp: 90 tablet, Rfl: 0 .  traZODone (DESYREL) 100 MG tablet, Take 1 tablet (100 mg total) by mouth at bedtime., Disp: 90 tablet, Rfl: 1  ------------------------------------------------------------------------- Social History   Tobacco Use  . Smoking status: Former Smoker    Years: 15.00  . Smokeless tobacco: Current User    Types: Chew  . Tobacco comment: stopped 15 years ago  Vaping Use  . Vaping Use: Never used  Substance  Use Topics  . Alcohol use: Yes    Comment: past  . Drug use: Never    Review of Systems Per HPI unless specifically indicated above     Objective:    BP (!) 120/56   Pulse 72   Temp (!) 96.6 F (35.9 C) (Temporal)   Resp 16   Ht 5\' 10"  (1.778 m)   Wt 204 lb 9.6 oz (92.8 kg)   SpO2 94%   BMI 29.36 kg/m   Wt Readings from Last 3 Encounters:  02/04/20 204 lb 9.6 oz (92.8 kg)  02/02/20 240 lb (108.9 kg)  12/31/19 230 lb (104.3 kg)    Physical Exam Vitals and nursing note reviewed.  Constitutional:      General: He is not in acute distress.    Appearance: He is well-developed.     Comments: Chronically ill 85 yr old male, mostly comfortable, cooperative  HENT:     Head: Normocephalic and atraumatic.  Eyes:     General:        Right eye: No discharge.        Left eye: No discharge.     Conjunctiva/sclera: Conjunctivae normal.  Cardiovascular:     Rate and Rhythm: Normal rate and regular rhythm.     Heart sounds: Normal heart sounds. No murmur heard.   Pulmonary:     Effort: Pulmonary effort is normal. No respiratory distress.     Breath sounds: Normal breath sounds. No wheezing or rales.  Musculoskeletal:     Cervical back: Normal range of motion and neck supple.     Comments: Wheelchair bound today  Skin:    General: Skin is warm.     Findings: No erythema or rash.  Neurological:     Mental Status: He is alert and oriented to person, place, and time.  Psychiatric:        Behavior: Behavior normal.     Comments: Well groomed, good eye contact, normal speech and thoughts. Mood is improved today        Results for orders placed or performed in visit on 02/02/20  ToxASSURE Select 13 (MW), Urine  Result Value Ref Range   Summary Note       Assessment & Plan:   Problem List Items Addressed This Visit    Major depressive disorder, recurrent episode, severe ([place]) - Primary   Relevant Medications   LORazepam (ATIVAN) 0.5 MG tablet   hydrOXYzine  (ATARAX/VISTARIL) 25 MG tablet   traZODone (DESYREL) 100 MG tablet   Generalized anxiety disorder with panic attacks   Relevant Medications   LORazepam (ATIVAN) 0.5 MG tablet   hydrOXYzine (ATARAX/VISTARIL) 25 MG tablet   traZODone (DESYREL) 100 MG tablet   Chronic pain syndrome (Chronic)   Relevant Medications   traZODone (DESYREL) 100 MG tablet   Atherosclerotic peripheral vascular disease (HCC) (Chronic)   Aortic atherosclerosis (  [place])    Identified aortic atherosclerosis on CT imaging 11/2019 On med management, Aspirin  Recurrent active severe major depression with generalized anxiety and panic Complex psychiatric history, with history of suicidal ideation Extensive medication regimen has been managed by inpatient psychiatry recently with recurrent behavioral health hospitalization and SNF rehab followed by psych.  Now recently discharged has persistent severe depression and anxiety affecting him daily, and he has had increased caregiver support now but needs to get established with outpatient psychiatry to help further adjust medications and manage his symptoms in outpatient setting.  Today main objective is med rec - as they have pill bottles from previous rx and old medications that were discontinued. Went through each pill bottle and printed med rec from his discharge from SNF and out list of current medications.  Updated med rec:  For BP STOP Hydrochlorothiazide 25mg  CONTINUE Amlodipine 10mg  daily  For Depression / Sleep STOP Trazodone 50mg  tabs START Trazodone should now be 100mg   - once nightly - new rx sent  STOP Venlafaxine STOP Wellbutrin (bupropion)  STOP Duloxetine 60mg  daily CONTINUE Duloxetine 20mg  tablets x 4 daily in AM = 80mg  total dose daily  RE ORDER Hydroxyzine 25mg  4 times a day  RE ORDER Lorazepam 0.5mg  take up to 3 to 4 times a day as needed for anxiety, can take every 6 hours. 30 day supply for now. Discussed risk of BDZ he is on chronically and  advised will need Psychiatry to manage this going forward once established.  Provided detailed list of outpatient Psychiatric offices in the region, I collaborated with Eula Fried LCSW CCM team who is assisting with this patient and provided mental health resources locations - asked patient / family to look into locations, I spoke with his daughter Hjalmer Iovino yesterday prior to appointment. Once identified suitable location for his outpatient psych management they can pursue referral or let us know to submit it.  Safety precautions reviewed. He has 24 hour caregiver support and increased supervision at home, family reassures that no weapon or potential harms in the house.  Additionally he has chronic pain management.  Family asks about possible consultation with Palliative Care however they were asking in regards to them providing therapy at home, discussed indication and benefits of palliative, they may help in future for med management but right now would need more established Psychiatry.  Keep CCM services, will up date team.  Meds ordered this encounter  Medications  . LORazepam (ATIVAN) 0.5 MG tablet    Sig: Take 1 tablet (0.5 mg total) by mouth every 6 (six) hours as needed for anxiety.    Dispense:  90 tablet    Refill:  0  . hydrOXYzine (ATARAX/VISTARIL) 25 MG tablet    Sig: Take 1 tablet (25 mg total) by mouth in the morning, at noon, in the evening, and at bedtime.    Dispense:  120 tablet    Refill:  2  . traZODone (DESYREL) 100 MG tablet    Sig: Take 1 tablet (100 mg total) by mouth at bedtime.    Dispense:  90 tablet    Refill:  1    Follow up plan: Return in about 4 weeks (around 03/03/2020), or if symptoms worsen or fail to improve, for Follow-up 4 weeks can do virtual Depression / Anxiety refills, f/u psych.   [name], [facility] 02/04/2020, 12:03 PM

## 2020-02-05 ENCOUNTER — Telehealth: Payer: Self-pay | Admitting: General Practice

## 2020-02-05 ENCOUNTER — Telehealth: Payer: Medicare Other

## 2020-02-05 ENCOUNTER — Telehealth: Payer: Self-pay | Admitting: Licensed Clinical Social Worker

## 2020-02-05 ENCOUNTER — Telehealth: Payer: Self-pay

## 2020-02-05 DIAGNOSIS — I700 Atherosclerosis of aorta: Secondary | ICD-10-CM | POA: Insufficient documentation

## 2020-02-05 LAB — TOXASSURE SELECT 13 (MW), URINE

## 2020-02-05 NOTE — Telephone Encounter (Addendum)
Chronic Care Management    Clinical Social Work General Follow Up Note  02/05/2020 Name: Aaron Gibbs MRN: [id number] DOB: [birth year]  Aaron Gibbs is a 85 y.o. year old male who is a primary care patient of Olin Hauser, DO. The CCM team was consulted for follow up assistance with Mental Health Counseling and Resource Connection   Review of patient status, including review of consultants reports, relevant laboratory and other test results, and collaboration with appropriate care team members and the patient's provider was performed as part of comprehensive patient evaluation and provision of chronic care management services.    LCSW completed CCM outreach attempt today but was unable to reach patient successfully. A HIPPA compliant voice message was left encouraging patient to return call once available. LCSW will reschedule CCM SW appointment as well.   Outpatient Encounter Medications as of 02/05/2020  Medication Sig  . albuterol (VENTOLIN HFA) 108 (90 Base) MCG/ACT inhaler INHALE 1-2 PUFFS INTO THE LUNGS EVERY 6 HOURS AS NEEDED (Patient taking differently: Inhale 1-2 puffs into the lungs every 6 (six) hours as needed for wheezing or shortness of breath. )  . amLODipine (NORVASC) 10 MG tablet Take 1 tablet (10 mg total) by mouth daily.  Marland Kitchen aspirin EC 81 MG tablet Take 81 mg by mouth daily.  Derrill Memo ON 02/25/2020] buprenorphine (BUTRANS) 10 MCG/HR PTWK Place 1 patch onto the skin once a week for 28 days. Apply only 1 patch at a time and alternate sites weekly.  Derrill Memo ON 03/24/2020] buprenorphine (BUTRANS) 10 MCG/HR PTWK Place 1 patch onto the skin once a week for 28 days. Apply only 1 patch at a time and alternate sites weekly.  Derrill Memo ON 04/21/2020] buprenorphine (BUTRANS) 10 MCG/HR PTWK Place 1 patch onto the skin once a week for 28 days. Apply only 1 patch at a time and alternate sites weekly.  . diclofenac sodium (VOLTAREN) 1 % GEL Apply 2 g topically daily as needed.     . DULoxetine (CYMBALTA) 20 MG capsule Take 4 capsules (80 mg total) by mouth daily.  . fenofibrate (TRICOR) 145 MG tablet Take 1 tablet (145 mg total) by mouth daily. (Patient taking differently: Take 145 mg by mouth at bedtime. )  . ferrous sulfate 325 (65 FE) MG tablet Take 325 mg by mouth daily with breakfast.  . hydrOXYzine (ATARAX/VISTARIL) 25 MG tablet Take 1 tablet (25 mg total) by mouth in the morning, at noon, in the evening, and at bedtime.  Marland Kitchen LORazepam (ATIVAN) 0.5 MG tablet Take 1 tablet (0.5 mg total) by mouth every 6 (six) hours as needed for anxiety.  . Multiple Vitamin (MULTIVITAMIN WITH MINERALS) TABS tablet Take 1 tablet by mouth daily.  . pantoprazole (PROTONIX) 40 MG tablet Take 40 mg by mouth daily.   . QUEtiapine (SEROQUEL) 100 MG tablet Take 100 mg by mouth at bedtime.  . timolol (TIMOPTIC) 0.5 % ophthalmic solution Place 1 drop into both eyes 2 (two) times daily. Apply one drop to eye(s)  . traZODone (DESYREL) 100 MG tablet Take 1 tablet (100 mg total) by mouth at bedtime.  Marland Kitchen umeclidinium-vilanterol (ANORO ELLIPTA) 62.5-25 MCG/INH AEPB Inhale 1 puff into the lungs daily.   No facility-administered encounter medications on file as of 02/05/2020.    Follow Up Plan: [facility] will reach out to patient to reschedule appointment.    Eula Fried, BSW, MSW, [email or web address] Phone: [phone]

## 2020-02-05 NOTE — Telephone Encounter (Signed)
  Chronic Care Management   Outreach Note  02/05/2020 Name: Aaron Gibbs MRN: [id number] DOB: [birth year]  Referred by: Olin Hauser, DO Reason for referral : Appointment (RNCM Follow up Appointment 2nd attempt for Chronic Disease Management and Care Coordination Needs)   A second unsuccessful telephone outreach was attempted today. The patient was referred to the case management team for assistance with care management and care coordination.   Follow Up Plan: A HIPPA compliant phone message was left for the patient providing contact information and requesting a return call.   Noreene Larsson RN, MSN, [place] Mobile: [phone]

## 2020-02-08 ENCOUNTER — Ambulatory Visit
Admission: EM | Admit: 2020-02-08 | Discharge: 2020-02-08 | Disposition: A | Discharge status: Home / Self Care | Payer: Medicare Other | Attending: Family Medicine | Admitting: Family Medicine

## 2020-02-08 DIAGNOSIS — N3001 Acute cystitis with hematuria: Secondary | ICD-10-CM | POA: Diagnosis present

## 2020-02-08 LAB — CBC WITH DIFFERENTIAL/PLATELET
Abs Immature Granulocytes: 0.01 10*3/uL (ref 0.00–0.07)
Basophils Absolute: 0 10*3/uL (ref 0.0–0.1)
Basophils Relative: 1 %
Eosinophils Absolute: 0.3 10*3/uL (ref 0.0–0.5)
Eosinophils Relative: 6 %
HCT: 43.5 % (ref 39.0–52.0)
Hemoglobin: 14.2 g/dL (ref 13.0–17.0)
Immature Granulocytes: 0 %
Lymphocytes Relative: 23 %
Lymphs Abs: 1.1 10*3/uL (ref 0.7–4.0)
MCH: 28.6 pg (ref 26.0–34.0)
MCHC: 32.6 g/dL (ref 30.0–36.0)
MCV: 87.5 fL (ref 80.0–100.0)
Monocytes Absolute: 0.7 10*3/uL (ref 0.1–1.0)
Monocytes Relative: 14 %
Neutro Abs: 2.6 10*3/uL (ref 1.7–7.7)
Neutrophils Relative %: 56 %
Platelets: 250 10*3/uL (ref 150–400)
RBC: 4.97 MIL/uL (ref 4.22–5.81)
RDW: 14.7 % (ref 11.5–15.5)
WBC: 4.7 10*3/uL (ref 4.0–10.5)
nRBC: 0 % (ref 0.0–0.2)

## 2020-02-08 LAB — URINALYSIS, COMPLETE (UACMP) WITH MICROSCOPIC
Bilirubin Urine: NEGATIVE
Glucose, UA: NEGATIVE mg/dL
Ketones, ur: NEGATIVE mg/dL
Nitrite: NEGATIVE
Protein, ur: 100 mg/dL — AB
Specific Gravity, Urine: >1.03 — ABNORMAL HIGH (ref 1.005–1.030)
WBC, UA: >50 WBC/hpf (ref 0–5)
pH: 5.5 (ref 5.0–8.0)

## 2020-02-08 LAB — COMPREHENSIVE METABOLIC PANEL
ALT: 29 U/L (ref 0–44)
AST: 28 U/L (ref 15–41)
Albumin: 4.2 g/dL (ref 3.5–5.0)
Alkaline Phosphatase: 48 U/L (ref 38–126)
Anion gap: 7 (ref 5–15)
BUN: 27 mg/dL — ABNORMAL HIGH (ref 8–23)
CO2: 25 mmol/L (ref 22–32)
Calcium: 9.5 mg/dL (ref 8.9–10.3)
Chloride: 104 mmol/L (ref 98–111)
Creatinine, Ser: 1.15 mg/dL (ref 0.61–1.24)
GFR calc Af Amer: >60 mL/min (ref >60)
GFR calc non Af Amer: 59 mL/min — ABNORMAL LOW (ref >60)
Glucose, Bld: 132 mg/dL — ABNORMAL HIGH (ref 70–99)
Potassium: 4.7 mmol/L (ref 3.5–5.1)
Sodium: 136 mmol/L (ref 135–145)
Total Bilirubin: 0.6 mg/dL (ref 0.3–1.2)
Total Protein: 7.5 g/dL (ref 6.5–8.1)

## 2020-02-08 MED ORDER — CEPHALEXIN 500 MG PO CAPS: 500.0000 mg | ORAL_CAPSULE | Freq: Two times a day (BID) | ORAL | 0 refills | Status: DC

## 2020-02-08 NOTE — Discharge Instructions (Signed)
Antibiotic as prescribed.  If he worsens, take him to the ER.  Take care  Dr. Lacinda Axon

## 2020-02-08 NOTE — ED Provider Notes (Signed)
[facility]    CSN: [id number] Arrival date & time: 02/08/20  1238      History   Chief Complaint Chief Complaint  Patient presents with  . Urinary Tract Infection   HPI  85 year old male presents with the above complaint.  Patient reports that he has had painful urination for the past week.  He reports some back pain.  Daughter states that he receives 24-hour care at home.  He has not had a fever but has had a decreased oral intake.  Daughter reports that he has had some confusion.  He has recently been discharged from the skilled nursing facility.  Patient reports his pain is 7/10 in severity.  No relieving factors.  No abdominal pain.  No other reported symptoms.  No other complaints.  Past Medical History:  Diagnosis Date  . Anxiety   . Cancer ([place])    skin  . COPD (chronic obstructive pulmonary disease) ([place])   . Glaucoma   . Osteoporosis    osteopenia    Patient Active Problem List   Diagnosis Date Noted  . Aortic atherosclerosis ([place]) 02/05/2020  . Generalized anxiety disorder with panic attacks 02/04/2020  . Acute kidney failure ([place]) 12/14/2019  . AKI (acute kidney injury) ([place]) 12/13/2019  . Depression with anxiety 12/13/2019  . Depression 12/12/2019  . Aspiration pneumonia ([place]) 09/25/2019  . COPD (chronic obstructive pulmonary disease) ([place])   . HLD (hyperlipidemia)   . Iron deficiency anemia   . Fall at home, initial encounter   . Medial meniscal tear, sequela (Left) 06/10/2019  . Patellar tendinosis (Right) 06/10/2019  . Acute respiratory failure with hypoxia ([place]) 04/09/2019  . Community acquired pneumonia 03/29/2019  . Lateral meniscal tear, sequela (Left) 03/03/2019  . Palpitation 02/13/2019  . Preop testing 11/14/2018  . Noncompliance with medication treatment due to overuse of medication 10/23/2018  . Neurogenic pain 08/20/2018  . Atherosclerotic peripheral vascular disease ([place]) 07/24/2018  . Tricompartment osteoarthritis of knee  (Left) 07/24/2018  . Osteoarthritis of knee (Bilateral) 07/24/2018  . Osteoarthritis of patellofemoral joint (Right) 07/24/2018  . History of suicide attempt (06/12/18) 07/24/2018  . Long term current use of opiate analgesic 07/10/2018  . Pharmacologic therapy 07/10/2018  . Disorder of skeletal system 07/10/2018  . Problems influencing health status 07/10/2018  . Suicide attempt ([place]) (05/13/18) 06/26/2018  . Acute metabolic encephalopathy 21/19/4174  . 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 Major depressive disorder, recurrent episode, severe ([place]) 06/04/2018  . Blindness 05/10/2018  . Suicidal ideation 05/10/2018  . Chronic pain syndrome 05/01/2018  . DISH (diffuse idiopathic skeletal hyperostosis) 05/01/2018  . Osteoarthritis of multiple joints 05/01/2018  . Chronic knee pain (Primary Area of Pain) (Bilateral) (L>R) 05/01/2018  . Retinitis pigmentosa of both eyes 05/01/2018  . Glaucoma of both eyes 05/01/2018  . Essential hypertension 05/01/2018  . Chronic low back pain (Bilateral) w/o sciatica 05/01/2018  . GERD (gastroesophageal reflux disease) 05/01/2018  . 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 Therapeutic opioid-induced constipation (OIC) 05/01/2018  . Centrilobular emphysema ([place]) 05/01/2018    Past Surgical History:  Procedure Laterality Date  . CHOLECYSTECTOMY    . TRANSURETHRAL RESECTION OF PROSTATE N/A 2008       Home Medications    Prior to Admission medications   Medication Sig Start Date End Date Taking? Authorizing Provider  albuterol (VENTOLIN HFA) 108 (90 Base) MCG/ACT inhaler INHALE 1-2 PUFFS INTO THE LUNGS EVERY 6 HOURS AS NEEDED Patient taking differently: Inhale 1-2 puffs into the lungs  every 6 (six) hours as needed for wheezing or shortness of breath.  09/22/19   Karamalegos, Devonne Doughty, DO  amLODipine (NORVASC) 10 MG tablet Take 1 tablet (10 mg total) by mouth daily. 11/18/19   Karamalegos, Devonne Doughty, DO  aspirin EC 81 MG tablet  Take 81 mg by mouth daily.    [provider]  buprenorphine (BUTRANS) 10 MCG/HR [place] 1 patch onto the skin once a week for 28 days. Apply only 1 patch at a time and alternate sites weekly. 02/25/20 03/24/20  Milinda Pointer, MD  buprenorphine (BUTRANS) 10 MCG/HR [place] 1 patch onto the skin once a week for 28 days. Apply only 1 patch at a time and alternate sites weekly. 03/24/20 04/21/20  Milinda Pointer, MD  buprenorphine (BUTRANS) 10 MCG/HR [place] 1 patch onto the skin once a week for 28 days. Apply only 1 patch at a time and alternate sites weekly. 04/21/20 05/19/20  Milinda Pointer, MD  cephALEXin (KEFLEX) 500 MG capsule Take 1 capsule (500 mg total) by mouth 2 (two) times daily. 02/08/20   Coral Spikes, DO  diclofenac sodium (VOLTAREN) 1 % GEL Apply 2 g topically daily as needed.  03/28/19   [provider]  DULoxetine (CYMBALTA) 20 MG capsule Take 4 capsules (80 mg total) by mouth daily. 12/07/19 02/05/20  Blake Divine, MD  fenofibrate (TRICOR) 145 MG tablet Take 1 tablet (145 mg total) by mouth daily. Patient taking differently: Take 145 mg by mouth at bedtime.  11/18/19   Karamalegos, Devonne Doughty, DO  ferrous sulfate 325 (65 FE) MG tablet Take 325 mg by mouth daily with breakfast.    [provider]  hydrOXYzine (ATARAX/VISTARIL) 25 MG tablet Take 1 tablet (25 mg total) by mouth in the morning, at noon, in the evening, and at bedtime. 02/04/20   Karamalegos, Devonne Doughty, DO  LORazepam (ATIVAN) 0.5 MG tablet Take 1 tablet (0.5 mg total) by mouth every 6 (six) hours as needed for anxiety. 02/04/20 03/05/20  Olin Hauser, DO  Multiple Vitamin (MULTIVITAMIN WITH MINERALS) TABS tablet Take 1 tablet by mouth daily.    [provider]  pantoprazole (PROTONIX) 40 MG tablet Take 40 mg by mouth daily.     [provider]  QUEtiapine (SEROQUEL) 100 MG tablet Take 100 mg by mouth at bedtime.    [provider]  timolol  (TIMOPTIC) 0.5 % ophthalmic solution Place 1 drop into both eyes 2 (two) times daily. Apply one drop to eye(s) 03/12/19   [provider]  traZODone (DESYREL) 100 MG tablet Take 1 tablet (100 mg total) by mouth at bedtime. 02/04/20   Karamalegos, Devonne Doughty, DO  umeclidinium-vilanterol (ANORO ELLIPTA) 62.5-25 MCG/INH AEPB Inhale 1 puff into the lungs daily.    [provider]    Family History Family History  Problem Relation Age of Onset  . Depression Mother   . Heart disease Father     Social History Social History   Tobacco Use  . Smoking status: Former Smoker    Years: 15.00  . Smokeless tobacco: Current User    Types: Chew  . Tobacco comment: stopped 15 years ago  Vaping Use  . Vaping Use: Never used  Substance Use Topics  . Alcohol use: Not Currently    Comment: past  . Drug use: Never     Allergies   Azithromycin   Review of Systems Review of Systems  Constitutional: Negative for fever.  Genitourinary: Positive for dysuria.  Musculoskeletal: Positive for back pain.  Psychiatric/Behavioral: Positive for confusion.   Physical Exam Triage Vital Signs ED Triage Vitals  Enc Vitals Group     BP 02/08/20 1300 (!) 141/76     Pulse Rate 02/08/20 1300 84     Resp 02/08/20 1300 18     Temp 02/08/20 1300 98.1 F (36.7 C)     Temp Source 02/08/20 1300 Oral     SpO2 02/08/20 1300 99 %     Weight 02/08/20 1302 204 lb 9.4 oz (92.8 kg)     Height 02/08/20 1302 5\' 10"  (1.778 m)     Head Circumference --      Peak Flow --      Pain Score 02/08/20 1301 7     Pain Loc --      Pain Edu? --      Excl. in [place]? --    Updated Vital Signs BP (!) 141/76   Pulse 84   Temp 98.1 F (36.7 C) (Oral)   Resp 18   Ht 5\' 10"  (1.778 m)   Wt 92.8 kg   SpO2 99%   BMI 29.36 kg/m   Visual Acuity Right Eye Distance:   Left Eye Distance:   Bilateral Distance:    Right Eye Near:   Left Eye Near:    Bilateral Near:     Physical Exam Constitutional:       Comments: Chronically ill-appearing elderly male.  HENT:     Head: Normocephalic and atraumatic.  Eyes:     General:        Right eye: No discharge.        Left eye: No discharge.     Conjunctiva/sclera: Conjunctivae normal.  Cardiovascular:     Rate and Rhythm: Normal rate and regular rhythm.  Pulmonary:     Effort: Pulmonary effort is normal.     Breath sounds: Normal breath sounds. No wheezing, rhonchi or rales.  Abdominal:     General: There is no distension.     Palpations: Abdomen is soft.     Tenderness: There is no abdominal tenderness.  Neurological:     Mental Status: He is alert.  Psychiatric:     Comments: Flat affect. Depressed mood.    UC Treatments / Results  Labs (all labs ordered are listed, but only abnormal results are displayed) Labs Reviewed  URINALYSIS, COMPLETE (UACMP) WITH MICROSCOPIC - Abnormal; Notable for the following components:      Result Value   Color, Urine AMBER (*)    APPearance CLOUDY (*)    Specific Gravity, Urine >1.030 (*)    Hgb urine dipstick TRACE (*)    Protein, ur 100 (*)    Leukocytes,Ua SMALL (*)    Bacteria, UA RARE (*)    All other components within normal limits  COMPREHENSIVE METABOLIC PANEL - Abnormal; Notable for the following components:   Glucose, Bld 132 (*)    BUN 27 (*)    GFR calc non Af Amer 59 (*)    All other components within normal limits  URINE CULTURE  CBC WITH DIFFERENTIAL/PLATELET    EKG   Radiology No results found.  Procedures Procedures (including critical care time)  Medications Ordered in UC Medications - No data to display  Initial Impression / Assessment and Plan / UC Course  I have reviewed the triage vital signs and the nursing notes.  Pertinent labs & imaging results that were available during my care of the patient were reviewed by  me and considered in my medical decision making (see chart for details).    85 year old male presents with UTI.  Awaiting culture.  CBC with no  leukocytosis.  Metabolic panel with normal creatinine.  Advise hydration.  Keflex as prescribed.  Final Clinical Impressions(s) / UC Diagnoses   Final diagnoses:  Acute cystitis with hematuria     Discharge Instructions     Antibiotic as prescribed.  If he worsens, take him to the ER.  Take care  Dr. Lacinda Axon    ED Prescriptions    Medication Sig Dispense Auth. Provider   cephALEXin (KEFLEX) 500 MG capsule Take 1 capsule (500 mg total) by mouth 2 (two) times daily. 14 capsule Thersa Salt G, DO     PDMP not reviewed this encounter.   Coral Spikes, [place] 02/08/20 1646

## 2020-02-08 NOTE — ED Triage Notes (Signed)
Pt reports having urgency, increased frequency and burning with urination x1 week. Pt reports having pain around kidney. Pt sts he was dx with UTI 1 month ago and treated with abx.

## 2020-02-09 ENCOUNTER — Other Ambulatory Visit: Payer: Self-pay

## 2020-02-09 ENCOUNTER — Emergency Department
Admission: EM | Admit: 2020-02-09 | Discharge: 2020-02-09 | Disposition: A | Discharge status: Home / Self Care | Payer: Medicare Other | Attending: Emergency Medicine | Admitting: Emergency Medicine

## 2020-02-09 DIAGNOSIS — R410 Disorientation, unspecified: Secondary | ICD-10-CM | POA: Diagnosis present

## 2020-02-09 DIAGNOSIS — Z5321 Procedure and treatment not carried out due to patient leaving prior to being seen by health care provider: Secondary | ICD-10-CM | POA: Diagnosis not present

## 2020-02-09 DIAGNOSIS — N390 Urinary tract infection, site not specified: Secondary | ICD-10-CM | POA: Diagnosis not present

## 2020-02-09 DIAGNOSIS — R443 Hallucinations, unspecified: Secondary | ICD-10-CM | POA: Diagnosis not present

## 2020-02-09 LAB — BASIC METABOLIC PANEL
Anion gap: 10 (ref 5–15)
BUN: 25 mg/dL — ABNORMAL HIGH (ref 8–23)
CO2: 21 mmol/L — ABNORMAL LOW (ref 22–32)
Calcium: 9.6 mg/dL (ref 8.9–10.3)
Chloride: 105 mmol/L (ref 98–111)
Creatinine, Ser: 1.04 mg/dL (ref 0.61–1.24)
GFR calc Af Amer: >60 mL/min (ref >60)
GFR calc non Af Amer: >60 mL/min (ref >60)
Glucose, Bld: 113 mg/dL — ABNORMAL HIGH (ref 70–99)
Potassium: 3.9 mmol/L (ref 3.5–5.1)
Sodium: 136 mmol/L (ref 135–145)

## 2020-02-09 LAB — CBC
HCT: 40.9 % (ref 39.0–52.0)
Hemoglobin: 13.5 g/dL (ref 13.0–17.0)
MCH: 28.7 pg (ref 26.0–34.0)
MCHC: 33 g/dL (ref 30.0–36.0)
MCV: 86.8 fL (ref 80.0–100.0)
Platelets: 220 10*3/uL (ref 150–400)
RBC: 4.71 MIL/uL (ref 4.22–5.81)
RDW: 14.4 % (ref 11.5–15.5)
WBC: 3.9 10*3/uL — ABNORMAL LOW (ref 4.0–10.5)
nRBC: 0 % (ref 0.0–0.2)

## 2020-02-09 LAB — URINE CULTURE

## 2020-02-09 NOTE — ED Notes (Signed)
First Nurse Note  Patient coming [facility] from home. Patient currently being treated for UTI, first dose antibiotivs yesterday. EMS vitals stable. Patient legally blind.

## 2020-02-09 NOTE — ED Triage Notes (Signed)
Pt coming from home via [facility]. Pt diagnosed with UTI yesterday and given oral antibiotics. Caretaker and daughter state pt has been increasingly confused and having hallucinations today. Pt states he is having increasing urgency and occasional burning when urinating. Pt also complains of constipation with LBM yesterday.

## 2020-02-10 NOTE — Telephone Encounter (Signed)
Pt has been r/s for 02/19/2020

## 2020-02-10 NOTE — Telephone Encounter (Signed)
Pt has been r/s for 03/02/2020

## 2020-02-17 ENCOUNTER — Telehealth: Payer: Self-pay

## 2020-02-18 ENCOUNTER — Ambulatory Visit: Payer: Medicare Other | Admitting: Pharmacist

## 2020-02-18 DIAGNOSIS — I10 Essential (primary) hypertension: Secondary | ICD-10-CM

## 2020-02-18 NOTE — Chronic Care Management (AMB) (Signed)
Chronic Care Management   Follow Up Note   02/18/2020 Name: Aaron Gibbs MRN: [id number] DOB: 11/18/1934  Referred by: Olin Hauser, DO Reason for referral : Chronic Care Management (Patient Phone Call)   Aaron Gibbs is a 85 y.o. year old male who is a primary care patient of Olin Hauser, DO. The CCM team was consulted for assistance with chronic disease management and care coordination needs.    I reached out to [name] by phone today.   Review of patient status, including review of consultants reports, relevant laboratory and other test results, and collaboration with appropriate care team members and the patient's provider was performed as part of comprehensive patient evaluation and provision of chronic care management services.    SDOH (Social Determinants of Health) assessments performed: No See Care Plan activities for detailed interventions related to [facility])     Outpatient Encounter Medications as of 02/18/2020  Medication Sig  . albuterol (VENTOLIN HFA) 108 (90 Base) MCG/ACT inhaler INHALE 1-2 PUFFS INTO THE LUNGS EVERY 6 HOURS AS NEEDED (Patient taking differently: Inhale 1-2 puffs into the lungs every 6 (six) hours as needed for wheezing or shortness of breath. )  . amLODipine (NORVASC) 10 MG tablet Take 1 tablet (10 mg total) by mouth daily.  Marland Kitchen aspirin EC 81 MG tablet Take 81 mg by mouth daily.  Derrill Memo ON 02/25/2020] buprenorphine (BUTRANS) 10 MCG/HR PTWK Place 1 patch onto the skin once a week for 28 days. Apply only 1 patch at a time and alternate sites weekly.  Derrill Memo ON 03/24/2020] buprenorphine (BUTRANS) 10 MCG/HR PTWK Place 1 patch onto the skin once a week for 28 days. Apply only 1 patch at a time and alternate sites weekly.  Derrill Memo ON 04/21/2020] buprenorphine (BUTRANS) 10 MCG/HR PTWK Place 1 patch onto the skin once a week for 28 days. Apply only 1 patch at a time and alternate sites weekly.  . diclofenac sodium (VOLTAREN) 1 %  GEL Apply 2 g topically daily as needed.   . DULoxetine (CYMBALTA) 20 MG capsule Take 4 capsules (80 mg total) by mouth daily.  . fenofibrate (TRICOR) 145 MG tablet Take 1 tablet (145 mg total) by mouth daily. (Patient taking differently: Take 145 mg by mouth at bedtime. )  . ferrous sulfate 325 (65 FE) MG tablet Take 325 mg by mouth daily with breakfast.  . hydrOXYzine (ATARAX/VISTARIL) 25 MG tablet Take 1 tablet (25 mg total) by mouth in the morning, at noon, in the evening, and at bedtime.  Marland Kitchen LORazepam (ATIVAN) 0.5 MG tablet Take 1 tablet (0.5 mg total) by mouth every 6 (six) hours as needed for anxiety.  . Multiple Vitamin (MULTIVITAMIN WITH MINERALS) TABS tablet Take 1 tablet by mouth daily.  . pantoprazole (PROTONIX) 40 MG tablet Take 40 mg by mouth daily.   . QUEtiapine (SEROQUEL) 100 MG tablet Take 100 mg by mouth at bedtime.  . timolol (TIMOPTIC) 0.5 % ophthalmic solution Place 1 drop into both eyes 2 (two) times daily. Apply one drop to eye(s)  . traZODone (DESYREL) 100 MG tablet Take 1 tablet (100 mg total) by mouth at bedtime.  Marland Kitchen umeclidinium-vilanterol (ANORO ELLIPTA) 62.5-25 MCG/INH AEPB Inhale 1 puff into the lungs daily.  . [DISCONTINUED] cephALEXin (KEFLEX) 500 MG capsule Take 1 capsule (500 mg total) by mouth 2 (two) times daily.   No facility-administered encounter medications on file as of 02/18/2020.    Goals Addressed  This Visit's Progress   . PharmD - Medication Review       CARE PLAN ENTRY (see longitudinal plan of care for additional care plan information)   Current Barriers:  . Chronic Disease Management support, education, and care coordination needs related to COPD, HTN, GERD, depression and chronic pain syndrome . Limited vision - Reports blind in left eye  Pharmacist Clinical Goal(s):  Marland Kitchen Over the next 30 days, patient will work with CM Pharmacist to complete medication review and address medication needs  identified.  Interventions: . Inter-disciplinary care team collaboration (see longitudinal plan of care) . Perform chart review. Note patient seen at [facility] on 8/22 for UTI . Follow up with patient today o Reports completed 7-day course of cephalexin as prescribed and urinary symptoms improved. . Patient states that he is not feeling well today - stomach has been upset since this morning.  o Denies need to contact office today, but states will call office tomorrow if still uncomfortable o Review option of return to Urgent Care if needed. Marland Kitchen Reschedule appointment to review medications as requested.  Patient Self Care Activities:  . Attends all scheduled provider appointments . Calls pharmacy for medication refills . Calls provider office for new concerns or questions   Please see past updates related to this goal by clicking on the "Past Updates" button in the selected goal         Plan  The care management team will reach out to the patient again over the next 30 days.   [name], PharmD, [employer] [phone]

## 2020-02-18 NOTE — Patient Instructions (Signed)
Thank you allowing the Chronic Care Management Team to be a part of your care! It was a pleasure speaking with you today!     CCM (Chronic Care Management) Team    Noreene Larsson RN, MSN, CCM Nurse Care Coordinator  [phone]   [name] PharmD  Clinical Pharmacist  [phone]   Eula Fried LCSW Clinical Social Worker [phone]  Visit Information  Goals Addressed            This Visit's Progress    PharmD - Medication Review       CARE PLAN ENTRY (see longitudinal plan of care for additional care plan information)   Current Barriers:   Chronic Disease Management support, education, and care coordination needs related to COPD, HTN, GERD, depression and chronic pain syndrome  Limited vision - Reports blind in left eye  Pharmacist Clinical Goal(s):   Over the next 30 days, patient will work with CM Pharmacist to complete medication review and address medication needs identified.  Interventions:  Inter-disciplinary care team collaboration (see longitudinal plan of care)  Perform chart review. Note patient seen at [facility] at [facility] on 8/22 for UTI  Follow up with patient today o Reports completed 7-day course of cephalexin as prescribed and urinary symptoms improved.  Patient states that he is not feeling well today - stomach has been upset since this morning.  o Denies need to contact office today, but states will call office tomorrow if still uncomfortable o Review option of return to Urgent Care if needed.  Reschedule appointment to review medications as requested.  Patient Self Care Activities:   Attends all scheduled provider appointments  Calls pharmacy for medication refills  Calls provider office for new concerns or questions   Please see past updates related to this goal by clicking on the "Past Updates" button in the selected goal         Patient verbalizes understanding of instructions provided today.   The  care management team will reach out to the patient again over the next 30 days.   [name], PharmD, [place] [employer] [phone]

## 2020-02-19 ENCOUNTER — Telehealth: Payer: Self-pay | Admitting: General Practice

## 2020-02-19 ENCOUNTER — Telehealth: Payer: Medicare Other

## 2020-02-19 NOTE — Telephone Encounter (Cosign Needed)
  Chronic Care Management   Outreach Note  02/19/2020 Name: Aaron Gibbs MRN: [id number] DOB: [birth year]  Referred by: Olin Hauser, DO Reason for referral : Chronic Care Management [facility] Outreach follow up for chronic disease management and care coordination needs- 3rd attempt)   Third unsuccessful telephone outreach was attempted today. The patient was referred to the case management team for assistance with care management and care coordination. The patient's primary care provider has been notified of our unsuccessful attempts to make or maintain contact with the patient. The care management team is pleased to engage with this patient at any time in the future should he/she be interested in assistance from the care management team.   Follow Up Plan: We have been unable to make contact with the patient for follow up. The care management team is available to follow up with the patient after provider conversation with the patient regarding recommendation for care management engagement and subsequent re-referral to the care management team.   Noreene Larsson RN, MSN, [facility] Mobile: [phone]

## 2020-02-25 ENCOUNTER — Encounter: Payer: Self-pay | Admitting: Family Medicine

## 2020-02-25 ENCOUNTER — Telehealth (INDEPENDENT_AMBULATORY_CARE_PROVIDER_SITE_OTHER): Payer: Medicare Other | Admitting: Family Medicine

## 2020-02-25 ENCOUNTER — Other Ambulatory Visit: Payer: Self-pay

## 2020-02-25 DIAGNOSIS — L89209 Pressure ulcer of unspecified hip, unspecified stage: Secondary | ICD-10-CM | POA: Diagnosis not present

## 2020-02-25 DIAGNOSIS — N390 Urinary tract infection, site not specified: Secondary | ICD-10-CM

## 2020-02-25 MED ORDER — MUPIROCIN CALCIUM 2 % EX CREA: 1.0000 "application " | TOPICAL_CREAM | Freq: Two times a day (BID) | CUTANEOUS | 2 refills | Status: DC

## 2020-02-25 MED ORDER — CIPROFLOXACIN HCL 500 MG PO TABS: 500.0000 mg | ORAL_TABLET | Freq: Two times a day (BID) | ORAL | 0 refills | Status: DC

## 2020-02-25 NOTE — Progress Notes (Signed)
Virtual Visit via Telephone The purpose of this virtual visit is to provide medical care while limiting exposure to the novel coronavirus (COVID19) for both patient and office staff.  Consent was obtained for phone visit:  Yes.   Answered questions that patient had about telehealth interaction:  Yes.   I discussed the limitations, risks, security and privacy concerns of performing an evaluation and management service by telephone. I also discussed with the patient that there may be a patient responsible charge related to this service. The patient expressed understanding and agreed to proceed.  Patient Location: Home Provider Location: [facility])  ---------------------------------------------------------------------- Chief Complaint  Patient presents with  . Fatigue    as per patient kidney hurts --fever 99 at one time--out from [facility] from past two weeks   . Anorexia    onset 2 weeks     S: Reviewed CMA documentation. I have called patient and gathered additional HPI as follows:  UTI Last [facility]/08/20 [facility] for dysuria some back pain, reduced PO intake Treated with Keflex 500 BID x 7 days, after had Urine culture abnormal and UA, lab work done, unable to isolate type of bacteria from urine culture He had improved symptoms on antibiotic, then symptoms returned again over past 1-2 weeks. Today still has urinary urgency and frequency some back pain in area of kidneys he reports Poor PO, reduced appetite History on prior mirtazapine but has been DC'd on this from hospital He says family still looking for options to get him in to mental health provider psychiatry  Additionally with skin sore from pressure on hip, he has used topical bactroban cream PRN with good results, request refill, denies open ulceration or drainage  Denies any known or suspected exposure to person with or possibly with COVID19.  Denies any fevers ( only  admits low grade < 19F), chills, sweats, body ache, cough, shortness of breath, sinus pain or pressure, headache, abdominal pain, diarrhea  Past Medical History:  Diagnosis Date  . Anxiety   . Cancer ([place])    skin  . COPD (chronic obstructive pulmonary disease) ([place])   . Glaucoma   . Osteoporosis    osteopenia   Social History   Tobacco Use  . Smoking status: Former Smoker    Years: 20.00  . Smokeless tobacco: Current User    Types: Chew  . Tobacco comment: stopped 15 years ago  Vaping Use  . Vaping Use: Never used  Substance Use Topics  . Alcohol use: Not Currently    Comment: past  . Drug use: Never    Current Outpatient Medications:  .  albuterol (VENTOLIN HFA) 108 (90 Base) MCG/ACT inhaler, INHALE 1-2 PUFFS INTO THE LUNGS EVERY 6 HOURS AS NEEDED (Patient taking differently: Inhale 1-2 puffs into the lungs every 6 (six) hours as needed for wheezing or shortness of breath. ), Disp: 8.5 g, Rfl: 3 .  amLODipine (NORVASC) 10 MG tablet, Take 1 tablet (10 mg total) by mouth daily., Disp: 90 tablet, Rfl: 1 .  aspirin EC 81 MG tablet, Take 81 mg by mouth daily., Disp: , Rfl:  .  buprenorphine (BUTRANS) 10 MCG/HR PTWK, Place 1 patch onto the skin once a week for 28 days. Apply only 1 patch at a time and alternate sites weekly., Disp: 4 patch, Rfl: 0 .  [START ON 03/24/2020] buprenorphine (BUTRANS) 10 MCG/HR PTWK, Place 1 patch onto the skin once a week for 28 days. Apply only  1 patch at a time and alternate sites weekly., Disp: 4 patch, Rfl: 0 .  [START ON 04/21/2020] buprenorphine (BUTRANS) 10 MCG/HR PTWK, Place 1 patch onto the skin once a week for 28 days. Apply only 1 patch at a time and alternate sites weekly., Disp: 4 patch, Rfl: 0 .  diclofenac sodium (VOLTAREN) 1 % GEL, Apply 2 g topically daily as needed. , Disp: , Rfl:  .  fenofibrate (TRICOR) 145 MG tablet, Take 1 tablet (145 mg total) by mouth daily. (Patient taking differently: Take 145 mg by mouth at bedtime. ), Disp: 90  tablet, Rfl: 1 .  ferrous sulfate 325 (65 FE) MG tablet, Take 325 mg by mouth daily with breakfast., Disp: , Rfl:  .  hydrOXYzine (ATARAX/VISTARIL) 25 MG tablet, Take 1 tablet (25 mg total) by mouth in the morning, at noon, in the evening, and at bedtime., Disp: 120 tablet, Rfl: 2 .  LORazepam (ATIVAN) 0.5 MG tablet, Take 1 tablet (0.5 mg total) by mouth every 6 (six) hours as needed for anxiety., Disp: 90 tablet, Rfl: 0 .  Multiple Vitamin (MULTIVITAMIN WITH MINERALS) TABS tablet, Take 1 tablet by mouth daily., Disp: , Rfl:  .  pantoprazole (PROTONIX) 40 MG tablet, Take 40 mg by mouth daily. , Disp: , Rfl:  .  QUEtiapine (SEROQUEL) 100 MG tablet, Take 100 mg by mouth at bedtime., Disp: , Rfl:  .  timolol (TIMOPTIC) 0.5 % ophthalmic solution, Place 1 drop into both eyes 2 (two) times daily. Apply one drop to eye(s), Disp: , Rfl:  .  traZODone (DESYREL) 100 MG tablet, Take 1 tablet (100 mg total) by mouth at bedtime., Disp: 90 tablet, Rfl: 1 .  umeclidinium-vilanterol (ANORO ELLIPTA) 62.5-25 MCG/INH AEPB, Inhale 1 puff into the lungs daily., Disp: , Rfl:  .  ciprofloxacin (CIPRO) 500 MG tablet, Take 1 tablet (500 mg total) by mouth 2 (two) times daily., Disp: 14 tablet, Rfl: 0 .  DULoxetine (CYMBALTA) 20 MG capsule, Take 4 capsules (80 mg total) by mouth daily., Disp: 120 capsule, Rfl: 1 .  mupirocin cream (BACTROBAN) 2 %, Apply 1 application topically 2 (two) times daily. For 1-2 weeks as needed, skin sore, Disp: 15 g, Rfl: 2  Depression screen [facility] 2/9 02/02/2020 12/31/2019 09/05/2019  Decreased Interest 0 0 1  Down, Depressed, Hopeless 0 0 1  PHQ - 2 Score 0 0 2  Altered sleeping - - 2  Tired, decreased energy - - 3  Change in appetite - - 1  Feeling bad or failure about yourself  - - 0  Trouble concentrating - - 1  Moving slowly or fidgety/restless - - 0  Suicidal thoughts - - 0  PHQ-9 Score - - 9  Difficult doing work/chores - - Somewhat difficult  Some recent data might be hidden     GAD 7 : Generalized Anxiety Score 05/27/2019 04/11/2019 07/12/2018 05/01/2018  Nervous, Anxious, on Edge 2 (No Data) 1 0  Control/stop worrying 2 - 1 0  Worry too much - different things 2 - 1 0  Trouble relaxing 2 - 1 0  Restless 1 - 0 0  Easily annoyed or irritable 1 - 0 0  Afraid - awful might happen 0 - 1 0  Total GAD 7 Score 10 - 5 0  Anxiety Difficulty Somewhat difficult - Somewhat difficult Not difficult at all    -------------------------------------------------------------------------- O: No physical exam performed due to remote telephone encounter.  Lab results reviewed.  Recent Results (from the past  2160 hour(s))  Comprehensive metabolic panel     Status: Abnormal   Collection Time: 12/06/19 10:55 PM  Result Value Ref Range   Sodium 137 135 - 145 mmol/L   Potassium 4.3 3.5 - 5.1 mmol/L   Chloride 103 98 - 111 mmol/L   CO2 28 22 - 32 mmol/L   Glucose, Bld 107 (H) 70 - 99 mg/dL    Comment: Glucose reference range applies only to samples taken after fasting for at least 8 hours.   BUN 16 8 - 23 mg/dL   Creatinine, Ser 1.13 0.61 - 1.24 mg/dL   Calcium 9.1 8.9 - 10.3 mg/dL   Total Protein 6.8 6.5 - 8.1 g/dL   Albumin 3.8 3.5 - 5.0 g/dL   AST 26 15 - 41 U/L   ALT 22 0 - 44 U/L   Alkaline Phosphatase 46 38 - 126 U/L   Total Bilirubin 0.7 0.3 - 1.2 mg/dL   GFR calc non Af Amer >60 >60 mL/min   GFR calc Af Amer >60 >60 mL/min   Anion gap 6 5 - 15    Comment: Performed at [facility], [place], [place] [address]  Troponin I (High Sensitivity)     Status: None   Collection Time: 12/06/19 10:55 PM  Result Value Ref Range   Troponin I (High Sensitivity) 8 <18 ng/L    Comment: (NOTE) Elevated high sensitivity troponin I (hsTnI) values and significant  changes across serial measurements may suggest ACS but many other  chronic and acute conditions are known to elevate hsTnI results.  Refer to the "Links" section for chest pain algorithms and  additional  guidance. Performed at [facility], [address] [place], [place] [address]   CBC with Differential     Status: Abnormal   Collection Time: 12/06/19 10:55 PM  Result Value Ref Range   WBC 4.4 4.0 - 10.5 K/uL   RBC 4.23 4.22 - 5.81 MIL/uL   Hemoglobin 12.2 (L) 13.0 - 17.0 g/dL   HCT 38.1 (L) 39 - 52 %   MCV 90.1 80.0 - 100.0 fL   MCH 28.8 26.0 - 34.0 pg   MCHC 32.0 30.0 - 36.0 g/dL   RDW 14.6 11.5 - 15.5 %   Platelets 168 150 - 400 K/uL   nRBC 0.0 0.0 - 0.2 %   Neutrophils Relative % 40 %   Neutro Abs 1.8 1.7 - 7.7 K/uL   Lymphocytes Relative 25 %   Lymphs Abs 1.1 0.7 - 4.0 K/uL   Monocytes Relative 19 %   Monocytes Absolute 0.8 0 - 1 K/uL   Eosinophils Relative 14 %   Eosinophils Absolute 0.6 (H) 0 - 0 K/uL   Basophils Relative 1 %   Basophils Absolute 0.0 0 - 0 K/uL   Immature Granulocytes 1 %   Abs Immature Granulocytes 0.02 0.00 - 0.07 K/uL    Comment: Performed at [facility], [address] [place], [place] [address]  Protime-INR     Status: None   Collection Time: 12/06/19 10:55 PM  Result Value Ref Range   Prothrombin Time 12.3 11.4 - 15.2 seconds   INR 1.0 0.8 - 1.2    Comment: (NOTE) INR goal varies based on device and disease states. Performed at [facility], [address] [place], [place] [address]   Urinalysis, Complete w Microscopic     Status: Abnormal   Collection Time: 12/06/19 10:55 PM  Result Value Ref Range  Color, Urine YELLOW (A) YELLOW   APPearance CLOUDY (A) CLEAR   Specific Gravity, Urine 1.013 1.005 - 1.030   pH 8.0 5.0 - 8.0   Glucose, UA NEGATIVE NEGATIVE mg/dL   Hgb urine dipstick NEGATIVE NEGATIVE   Bilirubin Urine NEGATIVE NEGATIVE   Ketones, ur NEGATIVE NEGATIVE mg/dL   Protein, ur 30 (A) NEGATIVE mg/dL   Nitrite NEGATIVE NEGATIVE   Leukocytes,Ua LARGE (A) NEGATIVE   RBC / HPF 0-5 0 - 5 RBC/hpf   WBC, UA 21-50 0 - 5 WBC/hpf   Bacteria, UA RARE (A) NONE SEEN   Squamous  Epithelial / LPF 0-5 0 - 5   WBC Clumps PRESENT     Comment: Performed at [facility], [address], [place], [place] [address]  Urine culture     Status: Abnormal   Collection Time: 12/06/19 10:55 PM   Specimen: Urine, Random  Result Value Ref Range   Specimen Description      URINE, RANDOM Performed at [facility], [address] [place], [place] [address]    Special Requests      NONE Performed at [facility], [address], [place], [place] [address]    Culture >=100,000 COLONIES/mL ENTEROCOCCUS FAECALIS (A)    Report Status 12/09/2019 FINAL    Organism ID, Bacteria ENTEROCOCCUS FAECALIS (A)       Susceptibility   Enterococcus faecalis - MIC*    AMPICILLIN <=2 SENSITIVE Sensitive     NITROFURANTOIN <=16 SENSITIVE Sensitive     VANCOMYCIN 1 SENSITIVE Sensitive     * >=100,000 COLONIES/mL ENTEROCOCCUS FAECALIS  Troponin I (High Sensitivity)     Status: None   Collection Time: 12/07/19  2:04 AM  Result Value Ref Range   Troponin I (High Sensitivity) 8 <18 ng/L    Comment: (NOTE) Elevated high sensitivity troponin I (hsTnI) values and significant  changes across serial measurements may suggest ACS but many other  chronic and acute conditions are known to elevate hsTnI results.  Refer to the "Links" section for chest pain algorithms and additional  guidance. Performed at [facility], [address] [place], [place] [address]   Type and screen Ordered by PROVIDER DEFAULT     Status: None   Collection Time: 12/07/19  2:22 AM  Result Value Ref Range   ABO/RH(D) O POS    Antibody Screen NEG    Sample Expiration      12/10/2019,2359 Performed at [facility], [place], [place] [address]   CBC with Differential     Status: Abnormal   Collection Time: 12/13/19  7:29 PM  Result Value Ref Range   WBC 6.0 4.0 - 10.5 K/uL   RBC 4.95 4.22 - 5.81 MIL/uL   Hemoglobin 14.4 13.0 - 17.0 g/dL   HCT 43.6 39 - 52  %   MCV 88.1 80.0 - 100.0 fL   MCH 29.1 26.0 - 34.0 pg   MCHC 33.0 30.0 - 36.0 g/dL   RDW 15.0 11.5 - 15.5 %   Platelets 186 150 - 400 K/uL   nRBC 0.0 0.0 - 0.2 %   Neutrophils Relative % 45 %   Neutro Abs 2.7 1.7 - 7.7 K/uL   Lymphocytes Relative 17 %   Lymphs Abs 1.0 0.7 - 4.0 K/uL   Monocytes Relative 23 %   Monocytes Absolute 1.4 (H) 0 - 1 K/uL   Eosinophils Relative 14 %   Eosinophils Absolute 0.8 (H) 0 - 0 K/uL  Basophils Relative 1 %   Basophils Absolute 0.1 0 - 0 K/uL   Immature Granulocytes 0 %   Abs Immature Granulocytes 0.01 0.00 - 0.07 K/uL    Comment: Performed at [facility], [address] [place], [place] [address]  Comprehensive metabolic panel     Status: Abnormal   Collection Time: 12/13/19  7:29 PM  Result Value Ref Range   Sodium 144 135 - 145 mmol/L   Potassium 3.6 3.5 - 5.1 mmol/L   Chloride 107 98 - 111 mmol/L   CO2 26 22 - 32 mmol/L   Glucose, Bld 141 (H) 70 - 99 mg/dL    Comment: Glucose reference range applies only to samples taken after fasting for at least 8 hours.   BUN 40 (H) 8 - 23 mg/dL   Creatinine, Ser 2.43 (H) 0.61 - 1.24 mg/dL   Calcium 9.9 8.9 - 10.3 mg/dL   Total Protein 7.6 6.5 - 8.1 g/dL   Albumin 4.4 3.5 - 5.0 g/dL   AST 38 15 - 41 U/L   ALT 21 0 - 44 U/L   Alkaline Phosphatase 55 38 - 126 U/L   Total Bilirubin 1.0 0.3 - 1.2 mg/dL   GFR calc non Af Amer 24 (L) >60 mL/min   GFR calc Af Amer 28 (L) >60 mL/min   Anion gap 11 5 - 15    Comment: Performed at [facility], [address] [place], [place] [address]  Lactic acid, plasma     Status: None   Collection Time: 12/13/19  7:29 PM  Result Value Ref Range   Lactic Acid, Venous 1.1 0.5 - 1.9 mmol/L    Comment: Performed at [facility], [address] [place], [place] [address]  Hemoglobin A1c     Status: None   Collection Time: 12/13/19  7:29 PM  Result Value Ref Range   Hgb A1c MFr Bld 5.4 4.8 - 5.6 %    Comment: (NOTE) Pre diabetes:           5.7%-6.4%  Diabetes:              >6.4%  Glycemic control for   <7.0% adults with diabetes    Mean Plasma Glucose 108.28 mg/dL    Comment: Performed at [facility] [address], [place], [place] [address]  SARS CORONAVIRUS 2 (TAT 6-24 HRS) Nasopharyngeal Nasopharyngeal Swab     Status: None   Collection Time: 12/13/19 10:14 PM   Specimen: Nasopharyngeal Swab  Result Value Ref Range   SARS Coronavirus 2 NEGATIVE NEGATIVE    Comment: (NOTE) SARS-CoV-2 target nucleic acids are NOT DETECTED.  The SARS-CoV-2 RNA is generally detectable in upper and lower respiratory specimens during the acute phase of infection. Negative results do not preclude SARS-CoV-2 infection, do not rule out co-infections with other pathogens, and should not be used as the sole basis for treatment or other patient management decisions. Negative results must be combined with clinical observations, patient history, and epidemiological information. The expected result is Negative.  Fact Sheet for Patients: SugarRoll.be  Fact Sheet for Healthcare Providers: https://www.woods-mathews.com/  This test is not yet approved or cleared by the [place] FDA and  has been authorized for detection and/or diagnosis of SARS-CoV-2 by FDA under an Emergency Use Authorization (EUA). This EUA will remain  in effect (meaning this test can be used) for the duration of the COVID-19 declaration under Se ction 564(b)(1) of the Act, 21 U.S.C. section 360bbb-3(b)(1),  unless the authorization is terminated or revoked sooner.  Performed at [facility], [place] [address], [place], [place] [address]   Ethanol     Status: None   Collection Time: 12/13/19 11:45 PM  Result Value Ref Range   Alcohol, Ethyl (B) <10 <10 mg/dL    Comment: (NOTE) Lowest detectable limit for serum alcohol is 10 mg/dL.  For medical purposes only. Performed at [facility], [address] [place], [place] [address]   Urinalysis, Complete w Microscopic     Status: Abnormal   Collection Time: 12/13/19 11:53 PM  Result Value Ref Range   Color, Urine AMBER (A) YELLOW    Comment: BIOCHEMICALS MAY BE AFFECTED BY COLOR   APPearance CLOUDY (A) CLEAR   Specific Gravity, Urine 1.026 1.005 - 1.030   pH 5.0 5.0 - 8.0   Glucose, UA NEGATIVE NEGATIVE mg/dL   Hgb urine dipstick NEGATIVE NEGATIVE   Bilirubin Urine SMALL (A) NEGATIVE   Ketones, ur 5 (A) NEGATIVE mg/dL   Protein, ur 100 (A) NEGATIVE mg/dL   Nitrite NEGATIVE NEGATIVE   Leukocytes,Ua SMALL (A) NEGATIVE   RBC / HPF 6-10 0 - 5 RBC/hpf   WBC, UA 11-20 0 - 5 WBC/hpf   Bacteria, UA NONE SEEN NONE SEEN   Squamous Epithelial / LPF 0-5 0 - 5   Mucus PRESENT    Hyaline Casts, UA PRESENT     Comment: Performed at [name], [address] [place], [place] [address]  Urine Drug Screen, Qualitative ([facility] only)     Status: Abnormal   Collection Time: 12/13/19 11:53 PM  Result Value Ref Range   Tricyclic, Ur Screen POSITIVE (A) NONE DETECTED   Amphetamines, Ur Screen NONE DETECTED NONE DETECTED   MDMA (Ecstasy)Ur Screen POSITIVE (A) NONE DETECTED   Cocaine Metabolite,Ur [place] NONE DETECTED NONE DETECTED   Opiate, Ur Screen POSITIVE (A) NONE DETECTED   Phencyclidine (PCP) Ur S NONE DETECTED NONE DETECTED   Cannabinoid 50 Ng, Ur [place] NONE DETECTED NONE DETECTED   Barbiturates, Ur Screen NONE DETECTED NONE DETECTED   Benzodiazepine, Ur Scrn POSITIVE (A) NONE DETECTED   Methadone Scn, Ur NONE DETECTED NONE DETECTED    Comment: (NOTE) Tricyclics + metabolites, urine    Cutoff 1000 ng/mL Amphetamines + metabolites, urine  Cutoff 1000 ng/mL MDMA (Ecstasy), urine              Cutoff 500 ng/mL Cocaine Metabolite, urine          Cutoff 300 ng/mL Opiate + metabolites, urine        Cutoff 300 ng/mL Phencyclidine (PCP), urine         Cutoff 25 ng/mL Cannabinoid, urine                 Cutoff 50 ng/mL Barbiturates +  metabolites, urine  Cutoff 200 ng/mL Benzodiazepine, urine              Cutoff 200 ng/mL Methadone, urine                   Cutoff 300 ng/mL  The urine drug screen provides only a preliminary, unconfirmed analytical test result and should not be used for non-medical purposes. Clinical consideration and professional judgment should be applied to any positive drug screen result due to possible interfering substances. A more specific alternate chemical method must be used in order to obtain a confirmed analytical result. Gas chromatography / mass spectrometry (GC/MS) is the preferred confirm  atory method. Performed at [facility], [address] [place], [place] [address]   Sodium, urine, random     Status: None   Collection Time: 12/13/19 11:53 PM  Result Value Ref Range   Sodium, Ur <10 mmol/L    Comment: Performed at [facility], [address] [place], [place] [address]  Creatinine, urine, random     Status: None   Collection Time: 12/13/19 11:53 PM  Result Value Ref Range   Creatinine, Urine 631 mg/dL    Comment: RESULTS CONFIRMED BY MANUAL DILUTION Performed at [facility], [address] [place], [place] [address]   CBC     Status: Abnormal   Collection Time: 12/14/19  5:54 AM  Result Value Ref Range   WBC 4.0 4.0 - 10.5 K/uL   RBC 4.25 4.22 - 5.81 MIL/uL   Hemoglobin 12.2 (L) 13.0 - 17.0 g/dL   HCT 37.5 (L) 39 - 52 %   MCV 88.2 80.0 - 100.0 fL   MCH 28.7 26.0 - 34.0 pg   MCHC 32.5 30.0 - 36.0 g/dL   RDW 14.9 11.5 - 15.5 %   Platelets 155 150 - 400 K/uL   nRBC 0.0 0.0 - 0.2 %    Comment: Performed at [facility], [address] [place], [place] [address]  Basic metabolic panel     Status: Abnormal   Collection Time: 12/14/19  5:54 AM  Result Value Ref Range   Sodium 143 135 - 145 mmol/L   Potassium 3.4 (L) 3.5 - 5.1 mmol/L   Chloride 109 98 - 111 mmol/L   CO2 24 22 - 32 mmol/L   Glucose, Bld 99 70 - 99 mg/dL     Comment: Glucose reference range applies only to samples taken after fasting for at least 8 hours.   BUN 38 (H) 8 - 23 mg/dL   Creatinine, Ser 1.84 (H) 0.61 - 1.24 mg/dL   Calcium 8.9 8.9 - 10.3 mg/dL   GFR calc non Af Amer 33 (L) >60 mL/min   GFR calc Af Amer 39 (L) >60 mL/min   Anion gap 10 5 - 15    Comment: Performed at [facility], [address] [place], [place] [address]  Basic metabolic panel     Status: Abnormal   Collection Time: 12/15/19  6:17 AM  Result Value Ref Range   Sodium 144 135 - 145 mmol/L   Potassium 3.8 3.5 - 5.1 mmol/L   Chloride 112 (H) 98 - 111 mmol/L   CO2 25 22 - 32 mmol/L   Glucose, Bld 98 70 - 99 mg/dL    Comment: Glucose reference range applies only to samples taken after fasting for at least 8 hours.   BUN 28 (H) 8 - 23 mg/dL   Creatinine, Ser 1.19 0.61 - 1.24 mg/dL   Calcium 8.8 (L) 8.9 - 10.3 mg/dL   GFR calc non Af Amer 57 (L) >60 mL/min   GFR calc Af Amer >60 >60 mL/min   Anion gap 7 5 - 15    Comment: Performed at [facility], [address] [place],  [address]  CBC     Status: None   Collection Time: 12/26/19  3:00 AM  Result Value Ref Range   WBC 4.6 4.0 - 10.5 K/uL   RBC 4.66 4.22 - 5.81 MIL/uL   Hemoglobin 13.6 13.0 - 17.0 g/dL   HCT 40.2 39 - 52 %   MCV 86.3 80.0 - 100.0  fL   MCH 29.2 26.0 - 34.0 pg   MCHC 33.8 30.0 - 36.0 g/dL   RDW 14.3 11.5 - 15.5 %   Platelets 190 150 - 400 K/uL   nRBC 0.0 0.0 - 0.2 %    Comment: Performed at [facility], [address] [place], [place] [address]  Comprehensive metabolic panel     Status: Abnormal   Collection Time: 12/26/19  3:00 AM  Result Value Ref Range   Sodium 139 135 - 145 mmol/L   Potassium 4.1 3.5 - 5.1 mmol/L   Chloride 104 98 - 111 mmol/L   CO2 27 22 - 32 mmol/L   Glucose, Bld 115 (H) 70 - 99 mg/dL    Comment: Glucose reference range applies only to samples taken after fasting for at least 8 hours.   BUN 15 8 - 23 mg/dL   Creatinine, Ser  1.07 0.61 - 1.24 mg/dL   Calcium 9.7 8.9 - 10.3 mg/dL   Total Protein 7.4 6.5 - 8.1 g/dL   Albumin 4.2 3.5 - 5.0 g/dL   AST 24 15 - 41 U/L   ALT 20 0 - 44 U/L   Alkaline Phosphatase 51 38 - 126 U/L   Total Bilirubin 0.6 0.3 - 1.2 mg/dL   GFR calc non Af Amer >60 >60 mL/min   GFR calc Af Amer >60 >60 mL/min   Anion gap 8 5 - 15    Comment: Performed at [facility], [address] [place], [place] [address]  Urinalysis, Complete w Microscopic     Status: Abnormal   Collection Time: 12/26/19  3:01 AM  Result Value Ref Range   Color, Urine YELLOW (A) YELLOW   APPearance CLEAR (A) CLEAR   Specific Gravity, Urine 1.011 1.005 - 1.030   pH 7.0 5.0 - 8.0   Glucose, UA NEGATIVE NEGATIVE mg/dL   Hgb urine dipstick NEGATIVE NEGATIVE   Bilirubin Urine NEGATIVE NEGATIVE   Ketones, ur NEGATIVE NEGATIVE mg/dL   Protein, ur 30 (A) NEGATIVE mg/dL   Nitrite NEGATIVE NEGATIVE   Leukocytes,Ua SMALL (A) NEGATIVE   RBC / HPF 0-5 0 - 5 RBC/hpf   WBC, UA 6-10 0 - 5 WBC/hpf   Bacteria, UA NONE SEEN NONE SEEN   Squamous Epithelial / LPF 0-5 0 - 5   Mucus PRESENT     Comment: Performed at [facility], [address], [place], [place] [address]  Urine culture     Status: Abnormal   Collection Time: 12/26/19  6:11 AM   Specimen: Urine, Clean Catch  Result Value Ref Range   Specimen Description      URINE, CLEAN CATCH Performed at [facility], [address], [place], [place] [address]    Special Requests      NONE Performed at [facility], [address] [place], [place] [address]    Culture MULTIPLE SPECIES PRESENT, SUGGEST RECOLLECTION (A)    Report Status 12/27/2019 FINAL   SARS Coronavirus 2 by RT PCR (hospital order, performed in [place] hospital lab) Nasopharyngeal Nasopharyngeal Swab     Status: None   Collection Time: 12/26/19 10:42 AM   Specimen: Nasopharyngeal Swab  Result Value Ref Range   SARS Coronavirus 2 NEGATIVE NEGATIVE     Comment: (NOTE) SARS-CoV-2 target nucleic acids are NOT DETECTED.  The SARS-CoV-2 RNA is generally detectable in upper and lower respiratory specimens during the acute phase of infection. The lowest concentration of SARS-CoV-2 viral copies this assay  can detect is 250 copies / mL. A negative result does not preclude SARS-CoV-2 infection and should not be used as the sole basis for treatment or other patient management decisions.  A negative result may occur with improper specimen collection / handling, submission of specimen other than nasopharyngeal swab, presence of viral mutation(s) within the areas targeted by this assay, and inadequate number of viral copies (<250 copies / mL). A negative result must be combined with clinical observations, patient history, and epidemiological information.  Fact Sheet for Patients:   StrictlyIdeas.no  Fact Sheet for Healthcare Providers: BankingDealers.co.za  This test is not yet approved or  cleared by the [place] FDA and has been authorized for detection and/or diagnosis of SARS-CoV-2 by FDA under an Emergency Use Authorization (EUA).  This EUA will remain in effect (meaning this test can be used) for the duration of the COVID-19 declaration under Section 564(b)(1) of the Act, 21 U.S.C. section 360bbb-3(b)(1), unless the authorization is terminated or revoked sooner.  Performed at [facility], [address] [place], [place] [address]   Drug Screen 10 W/Conf, Serum     Status: None   Collection Time: 12/31/19  2:20 PM  Result Value Ref Range   Amphetamines, IA Negative Cutoff:50 ng/mL   Barbiturates, IA Negative Cutoff:0.1 ug/mL   Benzodiazepines, IA Comment Cutoff:20 ng/mL    Comment: Insufficient sample volume to complete testing.   Cocaine & Metabolite, IA Negative Cutoff:25 ng/mL   Phencyclidine, IA Negative Cutoff:8 ng/mL   THC(Marijuana) Metabolite, IA Negative  Cutoff:5 ng/mL   Opiates, IA Negative Cutoff:5 ng/mL   Oxycodones, IA Comment Cutoff:5 ng/mL    Comment: Insufficient sample volume to complete testing.   Methadone, IA Negative Cutoff:25 ng/mL   Propoxyphene, IA Negative Cutoff:50 ng/mL    Comment: This test was developed and its performance characteristics determined by [facility].  It has not been cleared or approved by the Food and Drug Administration.   Benzodiazepines,Ms,Wb/Sp Rfx     Status: None   Collection Time: 12/31/19  2:20 PM  Result Value Ref Range   Benzodiazepines Confirm CANCELED     Comment: Test not performed. Quantity is insufficient for confirmation.  Result canceled by the ancillary.    Diazepam CANCELED     Comment: Test not performed  Result canceled by the ancillary.    Desmethyldiazepam CANCELED     Comment: Test not performed  Result canceled by the ancillary.    Oxazepam CANCELED     Comment: Test not performed  Result canceled by the ancillary.    Temazepam CANCELED     Comment: Test not performed  Result canceled by the ancillary.    Chlordiazepoxide CANCELED     Comment: Test not performed  Result canceled by the ancillary.    Desmethylchlordiazepoxide CANCELED     Comment: Test not performed  Result canceled by the ancillary.    Alprazolam CANCELED     Comment: Test not performed  Result canceled by the ancillary.    Triazolam CANCELED     Comment: Test not performed  Result canceled by the ancillary.    Lorazepam CANCELED     Comment: Test not performed  Result canceled by the ancillary.    Flurazepam CANCELED     Comment: Test not performed  Result canceled by the ancillary.    Desalkylflurazepam CANCELED     Comment: Test not performed  Result canceled by the ancillary.    Midazolam CANCELED     Comment: Test  not performed  Result canceled by the ancillary.    Clonazepam CANCELED     Comment: Test not performed  Result canceled by the ancillary.     7-Aminoclonazepam CANCELED     Comment: Test not performed  Result canceled by the ancillary.   Oxycodones,MS,WB/Sp Rfx     Status: None   Collection Time: 12/31/19  2:20 PM  Result Value Ref Range   Oxycodones Confirmation CANCELED     Comment: Test not performed. Quantity is insufficient for confirmation.  Result canceled by the ancillary.    Oxycocone CANCELED     Comment: Test not performed  Result canceled by the ancillary.    Oxymorphone CANCELED     Comment: Test not performed  Result canceled by the ancillary.   ToxASSURE Select 13 (MW), Urine     Status: None   Collection Time: 02/02/20  2:25 PM  Result Value Ref Range   Summary Note     Comment: ==================================================================== ToxASSURE Select 13 (MW) ==================================================================== Test                             Result       Flag       Units  Drug Present and Declared for Prescription Verification   Buprenorphine                  14           EXPECTED   ng/mg creat   Norbuprenorphine               22           EXPECTED   ng/mg creat    Source of buprenorphine is a scheduled prescription medication.    Norbuprenorphine is an expected metabolite of buprenorphine.  Drug Present not Declared for Prescription Verification   Oxazepam                       21           UNEXPECTED ng/mg creat    Oxazepam may be administered as a scheduled prescription medication;    it is also an expected metabolite of other benzodiazepine drugs,    including diazepam, chlordiazepoxide, prazepam, clorazepate,    halazepam, and temazepam.    Lorazepam                      730          UNEXPECTED ng/mg creat     Source of lorazepam is a scheduled prescription medication.  ==================================================================== Test                      Result    Flag   Units      Ref Range   Creatinine              105              mg/dL       >=20 ==================================================================== Declared Medications:  The flagging and interpretation on this report are based on the  following declared medications.  Unexpected results may arise from  inaccuracies in the declared medications.   **Note: The testing scope of this panel does not include small to  moderate amounts of these reported medications:   Buprenorphine Patch (BuTrans)   **Note: The testing scope of this panel does not include the  following  reported medications:   Albuterol  Amlodipine  Aspirin  Diclofenac (Voltaren)  Duloxetine (Cymbalta)  Fenofibrate (TriCor)  Iron  Multivitamin  Pantoprazole (Protonix)  Risperidone  Timolol  Trazodone  Umeclidinium (Anoro)   Vilanterol (Anoro) ==================================================================== For clinical consultation, please call [phone]. ====================================================================   Urinalysis, Complete w Microscopic Urine, Clean Catch     Status: Abnormal   Collection Time: 02/08/20  1:05 PM  Result Value Ref Range   Color, Urine AMBER (A) YELLOW    Comment: BIOCHEMICALS MAY BE AFFECTED BY COLOR   APPearance CLOUDY (A) CLEAR   Specific Gravity, Urine >1.030 (H) 1.005 - 1.030   pH 5.5 5.0 - 8.0   Glucose, UA NEGATIVE NEGATIVE mg/dL   Hgb urine dipstick TRACE (A) NEGATIVE   Bilirubin Urine NEGATIVE NEGATIVE   Ketones, ur NEGATIVE NEGATIVE mg/dL   Protein, ur 100 (A) NEGATIVE mg/dL   Nitrite NEGATIVE NEGATIVE   Leukocytes,Ua SMALL (A) NEGATIVE   Squamous Epithelial / LPF 0-5 0 - 5   WBC, UA >50 0 - 5 WBC/hpf   RBC / HPF 6-10 0 - 5 RBC/hpf   Bacteria, UA RARE (A) NONE SEEN   Hyaline Casts, UA PRESENT     Comment: Performed at [facility], [address] [place], [place] [address]  Urine Culture     Status: Abnormal   Collection Time: 02/08/20  1:05 PM   Specimen: Urine, Clean Catch  Result Value Ref Range    Specimen Description      URINE, CLEAN CATCH Performed at [facility], [address], [place], [place] [address]    Special Requests      NONE Performed at [facility], [address], [place], [place] [address]    Culture MULTIPLE SPECIES PRESENT, SUGGEST RECOLLECTION (A)    Report Status 02/09/2020 FINAL   CBC with Differential     Status: None   Collection Time: 02/08/20  1:53 PM  Result Value Ref Range   WBC 4.7 4.0 - 10.5 K/uL   RBC 4.97 4.22 - 5.81 MIL/uL   Hemoglobin 14.2 13.0 - 17.0 g/dL   HCT 43.5 39 - 52 %   MCV 87.5 80.0 - 100.0 fL   MCH 28.6 26.0 - 34.0 pg   MCHC 32.6 30.0 - 36.0 g/dL   RDW 14.7 11.5 - 15.5 %   Platelets 250 150 - 400 K/uL   nRBC 0.0 0.0 - 0.2 %   Neutrophils Relative % 56 %   Neutro Abs 2.6 1.7 - 7.7 K/uL   Lymphocytes Relative 23 %   Lymphs Abs 1.1 0.7 - 4.0 K/uL   Monocytes Relative 14 %   Monocytes Absolute 0.7 0 - 1 K/uL   Eosinophils Relative 6 %   Eosinophils Absolute 0.3 0 - 0 K/uL   Basophils Relative 1 %   Basophils Absolute 0.0 0 - 0 K/uL   Immature Granulocytes 0 %   Abs Immature Granulocytes 0.01 0.00 - 0.07 K/uL    Comment: Performed at [facility], [address] [place], [place] [address]  Comprehensive metabolic panel     Status: Abnormal   Collection Time: 02/08/20  1:53 PM  Result Value Ref Range   Sodium 136 135 - 145 mmol/L   Potassium 4.7 3.5 - 5.1 mmol/L   Chloride 104 98 - 111 mmol/L   CO2 25 22 - 32 mmol/L   Glucose, Bld 132 (H) 70 - 99 mg/dL    Comment:  Glucose reference range applies only to samples taken after fasting for at least 8 hours.   BUN 27 (H) 8 - 23 mg/dL   Creatinine, Ser 1.15 0.61 - 1.24 mg/dL   Calcium 9.5 8.9 - 10.3 mg/dL   Total Protein 7.5 6.5 - 8.1 g/dL   Albumin 4.2 3.5 - 5.0 g/dL   AST 28 15 - 41 U/L   ALT 29 0 - 44 U/L   Alkaline Phosphatase 48 38 - 126 U/L   Total Bilirubin 0.6 0.3 - 1.2 mg/dL   GFR calc non Af Amer 59 (L) >60 mL/min   GFR  calc Af Amer >60 >60 mL/min   Anion gap 7 5 - 15    Comment: Performed at [facility], [address] [place], [place] [address]  CBC     Status: Abnormal   Collection Time: 02/09/20  4:20 AM  Result Value Ref Range   WBC 3.9 (L) 4.0 - 10.5 K/uL   RBC 4.71 4.22 - 5.81 MIL/uL   Hemoglobin 13.5 13.0 - 17.0 g/dL   HCT 40.9 39 - 52 %   MCV 86.8 80.0 - 100.0 fL   MCH 28.7 26.0 - 34.0 pg   MCHC 33.0 30.0 - 36.0 g/dL   RDW 14.4 11.5 - 15.5 %   Platelets 220 150 - 400 K/uL   nRBC 0.0 0.0 - 0.2 %    Comment: Performed at [facility], [address], [place], [place] [address]  Basic metabolic panel     Status: Abnormal   Collection Time: 02/09/20  4:20 AM  Result Value Ref Range   Sodium 136 135 - 145 mmol/L   Potassium 3.9 3.5 - 5.1 mmol/L   Chloride 105 98 - 111 mmol/L   CO2 21 (L) 22 - 32 mmol/L   Glucose, Bld 113 (H) 70 - 99 mg/dL    Comment: Glucose reference range applies only to samples taken after fasting for at least 8 hours.   BUN 25 (H) 8 - 23 mg/dL   Creatinine, Ser 1.04 0.61 - 1.24 mg/dL   Calcium 9.6 8.9 - 10.3 mg/dL   GFR calc non Af Amer >60 >60 mL/min   GFR calc Af Amer >60 >60 mL/min   Anion gap 10 5 - 15    Comment: Performed at [facility], [address] [place], [place] [address]    -------------------------------------------------------------------------- A&P:  Problem List Items Addressed This Visit    None    Visit Diagnoses    Recurrent UTI    -  Primary   Relevant Medications   ciprofloxacin (CIPRO) 500 MG tablet   mupirocin cream (BACTROBAN) 2 %   Pressure injury of skin of hip, unspecified injury stage, unspecified laterality       Relevant Medications   mupirocin cream (BACTROBAN) 2 %     #UTI, recurrent Recently treated at urgent care 8/22 for UTI,with Keflex, temporary improvement, urine culture inconclusive with multiple types of bacteria present. No sensitivities. - In past has tolerated other  antibiotics well. - He has persistent symptoms, virtually reports low grade temp 6F only no higher fevers, no nausea vomiting, he has reduced appetite and back pain - cannot rule out pyelo but he reports clinically consistent with UTI - Will treat with empiric Cipro 500mg  BID x 7 days for better coverage, unfortunately no additional UA / Urine Culture at this time  #Pressure sore / skin No history of break in skin or ulcer by  his report Has soreness / redness Improved on Bactroban in past Will send refill  #Psychiatry referral - Last visit with me 02/04/20 we gave detailed handout with locations to patient/caregiver and his family was looking for Psychiatry office to get him established with, we have not heard back - and I asked him to have his family contact us when ready to refer if they can find a location.   Meds ordered this encounter  Medications  . ciprofloxacin (CIPRO) 500 MG tablet    Sig: Take 1 tablet (500 mg total) by mouth 2 (two) times daily.    Dispense:  14 tablet    Refill:  0  . mupirocin cream (BACTROBAN) 2 %    Sig: Apply 1 application topically 2 (two) times daily. For 1-2 weeks as needed, skin sore    Dispense:  15 g    Refill:  2    Follow-up: As needed if not improved 1-2 weeks  Patient verbalizes understanding with the above medical recommendations including the limitation of remote medical advice.  Specific follow-up and call-back criteria were given for patient to follow-up or seek medical care more urgently if needed.   - Time spent in direct consultation with patient on phone: 15 minutes   [name], [facility] 02/25/2020, 3:32 PM

## 2020-03-01 ENCOUNTER — Other Ambulatory Visit: Payer: Self-pay | Admitting: Family Medicine

## 2020-03-01 DIAGNOSIS — F410 Panic disorder [episodic paroxysmal anxiety] without agoraphobia: Secondary | ICD-10-CM

## 2020-03-01 NOTE — Telephone Encounter (Signed)
Requested medication (s) are due for refill today - yes- within the week  Requested medication (s) are on the active medication list - yes  Future visit scheduled -no  Last refill: 02/04/20  Notes to clinic: Request for non delegated Rx  Requested Prescriptions  Pending Prescriptions Disp Refills   LORazepam (ATIVAN) 0.5 MG tablet [Pharmacy Med Name: LORAZEPAM 0.5 MG TAB] 90 tablet     Sig: TAKE 1 TABLET BY MOUTH EVERY 6 HOURS AS NEEDED ANXIETY      Not Delegated - Psychiatry:  Anxiolytics/Hypnotics Failed - 03/01/2020  1:17 PM      Failed - This refill cannot be delegated      Passed - Urine Drug Screen completed in last 360 days.      Passed - Valid encounter within last 6 months    Recent Outpatient Visits           5 days ago Recurrent UTI   [facility] [place], Devonne Doughty, DO   3 weeks ago Severe episode of recurrent major depressive disorder, with psychotic features [facility])   [facility], Devonne Doughty, DO   5 months ago Severe episode of recurrent major depressive disorder, without psychotic features ([place])   [facility], Devonne Doughty, DO   9 months ago Severe episode of recurrent major depressive disorder, without psychotic features ([place])   [facility], Devonne Doughty, DO   10 months ago Acute exacerbation of chronic obstructive pulmonary disease (COPD) ([place])   [facility] Olin Hauser, DO                  Requested Prescriptions  Pending Prescriptions Disp Refills   LORazepam (ATIVAN) 0.5 MG tablet [Pharmacy Med Name: LORAZEPAM 0.5 MG TAB] 90 tablet     Sig: TAKE 1 TABLET BY MOUTH EVERY 6 HOURS AS NEEDED ANXIETY      Not Delegated - Psychiatry:  Anxiolytics/Hypnotics Failed - 03/01/2020  1:17 PM      Failed - This refill cannot be delegated      Passed - Urine Drug Screen completed in last 360 days.      Passed - Valid encounter  within last 6 months    Recent Outpatient Visits           5 days ago Recurrent UTI   [facility] [place], Devonne Doughty, DO   3 weeks ago Severe episode of recurrent major depressive disorder, with psychotic features [facility])   [place], [place]   5 months ago Severe episode of recurrent major depressive disorder, without psychotic features [facility])   [facility] [place], Devonne Doughty, DO   9 months ago Severe episode of recurrent major depressive disorder, without psychotic features [facility])   [place], DO   10 months ago Acute exacerbation of chronic obstructive pulmonary disease (COPD) [facility])   [facility], Devonne Doughty, DO

## 2020-03-02 ENCOUNTER — Telehealth: Payer: Medicare Other

## 2020-03-02 ENCOUNTER — Telehealth: Payer: Self-pay | Admitting: Licensed Clinical Social Worker

## 2020-03-02 NOTE — Telephone Encounter (Signed)
Chronic Care Management    Clinical Social Work General Follow Up Note  03/02/2020 Name: Aaron Gibbs MRN: [id number] DOB: May 30, 1935  Aaron Gibbs is a 85 y.o. year old male who is a primary care patient of Olin Hauser, DO. The CCM team was consulted for assistance with Mental Health Counseling and Resources.   Review of patient status, including review of consultants reports, relevant laboratory and other test results, and collaboration with appropriate care team members and the patient's provider was performed as part of comprehensive patient evaluation and provision of chronic care management services.    LCSW completed CCM outreach attempt today but was unable to reach patient successfully. A HIPPA compliant voice message was left encouraging patient to return call once available. LCSW will ask Scheduling Care Guide to reschedule CCM SW appointment with patient as well.  Outpatient Encounter Medications as of 03/02/2020  Medication Sig  . albuterol (VENTOLIN HFA) 108 (90 Base) MCG/ACT inhaler INHALE 1-2 PUFFS INTO THE LUNGS EVERY 6 HOURS AS NEEDED (Patient taking differently: Inhale 1-2 puffs into the lungs every 6 (six) hours as needed for wheezing or shortness of breath. )  . amLODipine (NORVASC) 10 MG tablet Take 1 tablet (10 mg total) by mouth daily.  Marland Kitchen aspirin EC 81 MG tablet Take 81 mg by mouth daily.  . buprenorphine (BUTRANS) 10 MCG/HR PTWK Place 1 patch onto the skin once a week for 28 days. Apply only 1 patch at a time and alternate sites weekly.  Derrill Memo ON 03/24/2020] buprenorphine (BUTRANS) 10 MCG/HR PTWK Place 1 patch onto the skin once a week for 28 days. Apply only 1 patch at a time and alternate sites weekly.  Derrill Memo ON 04/21/2020] buprenorphine (BUTRANS) 10 MCG/HR PTWK Place 1 patch onto the skin once a week for 28 days. Apply only 1 patch at a time and alternate sites weekly.  . ciprofloxacin (CIPRO) 500 MG tablet Take 1 tablet (500 mg total) by mouth  2 (two) times daily.  . diclofenac sodium (VOLTAREN) 1 % GEL Apply 2 g topically daily as needed.   . DULoxetine (CYMBALTA) 20 MG capsule Take 4 capsules (80 mg total) by mouth daily.  . fenofibrate (TRICOR) 145 MG tablet Take 1 tablet (145 mg total) by mouth daily. (Patient taking differently: Take 145 mg by mouth at bedtime. )  . ferrous sulfate 325 (65 FE) MG tablet Take 325 mg by mouth daily with breakfast.  . hydrOXYzine (ATARAX/VISTARIL) 25 MG tablet Take 1 tablet (25 mg total) by mouth in the morning, at noon, in the evening, and at bedtime.  Marland Kitchen LORazepam (ATIVAN) 0.5 MG tablet TAKE 1 TABLET BY MOUTH EVERY 6 HOURS AS NEEDED ANXIETY  . Multiple Vitamin (MULTIVITAMIN WITH MINERALS) TABS tablet Take 1 tablet by mouth daily.  . mupirocin cream (BACTROBAN) 2 % Apply 1 application topically 2 (two) times daily. For 1-2 weeks as needed, skin sore  . pantoprazole (PROTONIX) 40 MG tablet Take 40 mg by mouth daily.   . QUEtiapine (SEROQUEL) 100 MG tablet Take 100 mg by mouth at bedtime.  . timolol (TIMOPTIC) 0.5 % ophthalmic solution Place 1 drop into both eyes 2 (two) times daily. Apply one drop to eye(s)  . traZODone (DESYREL) 100 MG tablet Take 1 tablet (100 mg total) by mouth at bedtime.  Marland Kitchen umeclidinium-vilanterol (ANORO ELLIPTA) 62.5-25 MCG/INH AEPB Inhale 1 puff into the lungs daily.   No facility-administered encounter medications on file as of 03/02/2020.   Follow Up Plan: Scheduling Care  Guide will reach out to patient to reschedule appointment.   Eula Fried, BSW, [email or web address], [email or web address] Phone: [phone]

## 2020-03-04 ENCOUNTER — Telehealth: Payer: Self-pay | Admitting: Pain Medicine

## 2020-03-04 NOTE — Telephone Encounter (Signed)
Patient states his meds are not enough to help with his pain and he is adding tylenol with it. He wants to set up a virtual appt to speak with Dr. Dossie Arbour today. There are no openings in schedule for today. Please ask Dr. Dossie Arbour what he would like to to?  He has appt on 03-23-20 for RFA

## 2020-03-04 NOTE — Telephone Encounter (Signed)
Please advise 

## 2020-03-05 ENCOUNTER — Ambulatory Visit (INDEPENDENT_AMBULATORY_CARE_PROVIDER_SITE_OTHER): Payer: Medicare Other | Admitting: Family Medicine

## 2020-03-05 ENCOUNTER — Other Ambulatory Visit: Payer: Self-pay

## 2020-03-05 ENCOUNTER — Other Ambulatory Visit: Payer: Self-pay | Admitting: Family Medicine

## 2020-03-05 ENCOUNTER — Encounter: Payer: Self-pay | Admitting: Family Medicine

## 2020-03-05 VITALS — BP 137/66 | HR 79 | Temp 97.3°F | Resp 16 | Ht 70.0 in | Wt 205.0 lb

## 2020-03-05 DIAGNOSIS — M25561 Pain in right knee: Secondary | ICD-10-CM

## 2020-03-05 DIAGNOSIS — G8929 Other chronic pain: Secondary | ICD-10-CM

## 2020-03-05 DIAGNOSIS — M1711 Unilateral primary osteoarthritis, right knee: Secondary | ICD-10-CM

## 2020-03-05 DIAGNOSIS — Z23 Encounter for immunization: Secondary | ICD-10-CM

## 2020-03-05 DIAGNOSIS — F411 Generalized anxiety disorder: Secondary | ICD-10-CM

## 2020-03-05 DIAGNOSIS — M1712 Unilateral primary osteoarthritis, left knee: Secondary | ICD-10-CM | POA: Diagnosis not present

## 2020-03-05 DIAGNOSIS — M25562 Pain in left knee: Secondary | ICD-10-CM | POA: Diagnosis not present

## 2020-03-05 DIAGNOSIS — G894 Chronic pain syndrome: Secondary | ICD-10-CM

## 2020-03-05 MED ORDER — LIDOCAINE HCL (PF) 1 % IJ SOLN
4.0000 mL | Freq: Once | INTRAMUSCULAR | Status: AC
  Administered 2020-03-05: 4 mL

## 2020-03-05 MED ORDER — METHYLPREDNISOLONE ACETATE 40 MG/ML IJ SUSP
40.0000 mg | Freq: Once | INTRAMUSCULAR | Status: AC
  Administered 2020-03-05: 40 mg via INTRA_ARTICULAR

## 2020-03-05 NOTE — Progress Notes (Signed)
Subjective:    Patient ID: Aaron Gibbs, male    DOB: 1934/07/26, 85 y.o.   MRN: [id number]  Aaron Gibbs is a 85 y.o. male presenting on 03/05/2020 for Knee Pain and Anxiety   HPI   Chronic Pain Syndrome / OA/DJD multiple joints / Bilateral knee pain Followed by [facility] Dr Dossie Arbour, last visit 02/02/20 He is managed on Butrans patches, and will be scheduled for future L genicular nerve RF ablation. - Today he reports worsening L>R bilateral knee pain, says patch has not been as effectively lately for this particular pain. Denies any new injury or problem. He has advanced knee arthritis, prior x-rays done on chart. - He reports complex prior history of various pain medicines in past with Oxycodone, Morphine and Lyrica. - He takes Duloxetine currently. - he has had injections in knees in the past, none recently. - Denies any new injury fall trauma swelling redness of joint fever or chills  Severe Recurrent Major Depression, chronic Generalized Anxiety Chronic mental health problems He is on current medication regimen, we are awaiting identifying Psychiatry office that will establish with him. They have no new update. He needs refill of Lorazepam, however it was sent in on 03/01/20. See scores below   Health Maintenance: Due for Flu Shot, will receive today    Depression screen [facility] 2/9 03/05/2020 02/02/2020 12/31/2019  Decreased Interest 2 0 0  Down, Depressed, Hopeless 2 0 0  PHQ - 2 Score 4 0 0  Altered sleeping 3 - -  Tired, decreased energy 2 - -  Change in appetite 2 - -  Feeling bad or failure about yourself  1 - -  Trouble concentrating 2 - -  Moving slowly or fidgety/restless 2 - -  Suicidal thoughts 1 - -  PHQ-9 Score 17 - -  Difficult doing work/chores Very difficult - -  Some recent data might be hidden   GAD 7 : Generalized Anxiety Score 03/05/2020 05/27/2019 04/11/2019 07/12/2018  Nervous, Anxious, on Edge 3 2 (No Data) 1  Control/stop worrying 2  2 - 1  Worry too much - different things 2 2 - 1  Trouble relaxing 2 2 - 1  Restless 2 1 - 0  Easily annoyed or irritable 1 1 - 0  Afraid - awful might happen 3 0 - 1  Total GAD 7 Score 15 10 - 5  Anxiety Difficulty Somewhat difficult Somewhat difficult - Somewhat difficult     Social History   Tobacco Use  . Smoking status: Former Smoker    Years: 20.00  . Smokeless tobacco: Current User    Types: Chew  . Tobacco comment: stopped 15 years ago  Vaping Use  . Vaping Use: Never used  Substance Use Topics  . Alcohol use: Not Currently    Comment: past  . Drug use: Never    Review of Systems Per HPI unless specifically indicated above     Objective:    BP 137/66   Pulse 79   Temp (!) 97.3 F (36.3 C) (Temporal)   Resp 16   Ht 5\' 10"  (1.778 m)   Wt 205 lb (93 kg)   SpO2 98%   BMI 29.41 kg/m   Wt Readings from Last 3 Encounters:  03/05/20 205 lb (93 kg)  02/09/20 210 lb (95.3 kg)  02/08/20 204 lb 9.4 oz (92.8 kg)    Physical Exam Vitals and nursing note reviewed.  Constitutional:      General: He is not  in acute distress.    Appearance: He is well-developed. He is not diaphoretic.     Comments: Well-appearing, comfortable, cooperative  HENT:     Head: Normocephalic and atraumatic.  Eyes:     General:        Right eye: No discharge.        Left eye: No discharge.     Conjunctiva/sclera: Conjunctivae normal.  Cardiovascular:     Rate and Rhythm: Normal rate.  Pulmonary:     Effort: Pulmonary effort is normal.  Musculoskeletal:     Comments: Using walker  Bilateral knees with bulky appearance, crepitus on range of motion, generalized tender to palpation joint line, no focal abnormality, minor effusion.  Skin:    General: Skin is warm and dry.     Findings: No erythema or rash.  Neurological:     Mental Status: He is alert and oriented to person, place, and time.  Psychiatric:        Behavior: Behavior normal.     Comments: Well groomed, good eye  contact, normal speech and thoughts      I have personally reviewed the radiology report from 09/13/19 Left KNee X-ray.  CLINICAL DATA:  Status post fall, syncopal episode  EXAM: LEFT KNEE - COMPLETE 4+ VIEW  COMPARISON:  None.  FINDINGS: No acute fracture or dislocation. No aggressive osseous lesion. Mild lateral femorotibial compartment joint space narrowing. No joint effusion. No soft tissue abnormality. Peripheral vascular atherosclerotic disease.  IMPRESSION: No acute osseous injury of the left knee.   Electronically Signed   By: Kathreen Devoid   On: 09/13/2019 04:13  Results for orders placed or performed during the hospital encounter of 02/09/20  CBC  Result Value Ref Range   WBC 3.9 (L) 4.0 - 10.5 K/uL   RBC 4.71 4.22 - 5.81 MIL/uL   Hemoglobin 13.5 13.0 - 17.0 g/dL   HCT 40.9 39 - 52 %   MCV 86.8 80.0 - 100.0 fL   MCH 28.7 26.0 - 34.0 pg   MCHC 33.0 30.0 - 36.0 g/dL   RDW 14.4 11.5 - 15.5 %   Platelets 220 150 - 400 K/uL   nRBC 0.0 0.0 - 0.2 %  Basic metabolic panel  Result Value Ref Range   Sodium 136 135 - 145 mmol/L   Potassium 3.9 3.5 - 5.1 mmol/L   Chloride 105 98 - 111 mmol/L   CO2 21 (L) 22 - 32 mmol/L   Glucose, Bld 113 (H) 70 - 99 mg/dL   BUN 25 (H) 8 - 23 mg/dL   Creatinine, Ser 1.04 0.61 - 1.24 mg/dL   Calcium 9.6 8.9 - 10.3 mg/dL   GFR calc non Af Amer >60 >60 mL/min   GFR calc Af Amer >60 >60 mL/min   Anion gap 10 5 - 15      Assessment & Plan:   Problem List Items Addressed This Visit    Chronic pain syndrome - Primary (Chronic)   Chronic knee pain (Primary Area of Pain) (Bilateral) (L>R) (Chronic)    Other Visit Diagnoses    Primary osteoarthritis of left knee       Relevant Medications   lidocaine (PF) (XYLOCAINE) 1 % injection 4 mL (Completed)   methylPREDNISolone acetate (DEPO-MEDROL) injection 40 mg (Completed)   methylPREDNISolone acetate (DEPO-MEDROL) injection 40 mg (Completed)   Primary osteoarthritis of right knee        Relevant Medications   methylPREDNISolone acetate (DEPO-MEDROL) injection 40 mg (Completed)   lidocaine (  PF) (XYLOCAINE) 1 % injection 4 mL (Completed)   methylPREDNISolone acetate (DEPO-MEDROL) injection 40 mg (Completed)   Needs flu shot       Relevant Orders   Flu Vaccine QUAD High Dose(Fluad) (Completed)      #Anxiety Patient called [place] while in room with me. They spoke with pharmacy and confirmed Lorazepam rx was ready for pick up now, they will get it ready. Advised that they should keep looking through list of available Psychiatry office to identify where we can help get him established. He is working with [facility]  Subacute on chronic R/L generalized Knee pain and swelling without known injury or trauma. Known knee OA/DJD advanced, prior x-ray imaging. Followed by [facility] - Able to bear weight, no knee instability or obvious mechanical locking - No prior history of knee surgery, arthroscopy  Plan: 1. Discussed limitation with his chronic pain control today - he is managed by [facility]. I am not able to change any of his pain medication, and he is on Butrans patch, and not on oral opiates anymore, therefore, it would not be medically appropriate to order this medication. - He has already reached out to [facility] by phone yesterday, and is waiting on response, Dr Dossie Arbour is out of office at this time.  - Proceed with cortisone injections bilateral knees for OA/DJD for temporary relief, discussed benefit risk side effect. Tolerated well, see procedure notes - May use topical NSAID, ice rest compression elevation - Tylenol up to 1000mg  3-4 times a day max  F/u as needed, if injections help can repeat in future >3 months  Will forward / CC chart to his pain management provider to review his concern on the Butrans patch and see if they can follow-up with him sooner to address this issue. He was encouraged to contact them next week if  not heard back.   Meds ordered this encounter  Medications  . lidocaine (PF) (XYLOCAINE) 1 % injection 4 mL  . methylPREDNISolone acetate (DEPO-MEDROL) injection 40 mg  . lidocaine (PF) (XYLOCAINE) 1 % injection 4 mL  . methylPREDNISolone acetate (DEPO-MEDROL) injection 40 mg     Follow up plan: Return if symptoms worsen or fail to improve, for knee pain.  [name], [facility] 03/05/2020, 3:00 PM

## 2020-03-05 NOTE — Telephone Encounter (Signed)
Requested medication (s) are due for refill today: yes  Requested medication (s) are on the active medication list: yes  Last refill:  03/01/20  Future visit scheduled: no  Notes to clinic:  not delegated    Requested Prescriptions  Pending Prescriptions Disp Refills   LORazepam (ATIVAN) 0.5 MG tablet [Pharmacy Med Name: LORAZEPAM 0.5 MG TAB] 90 tablet     Sig: TAKE 1 TABLET BY MOUTH EVERY 6 HOURS AS NEEDED ANXIETY      Not Delegated - Psychiatry:  Anxiolytics/Hypnotics Failed - 03/05/2020  3:19 PM      Failed - This refill cannot be delegated      Passed - Urine Drug Screen completed in last 360 days.      Passed - Valid encounter within last 6 months    Recent Outpatient Visits           Today Chronic pain syndrome   [place], DO   1 week ago Recurrent UTI   [facility] [place], Devonne Doughty, DO   1 month ago Severe episode of recurrent major depressive disorder, with psychotic features [facility])   [place], DO   6 months ago Severe episode of recurrent major depressive disorder, without psychotic features [facility])   [place], DO   9 months ago Severe episode of recurrent major depressive disorder, without psychotic features [facility])   [place], Devonne Doughty, DO

## 2020-03-05 NOTE — Patient Instructions (Addendum)
Thank you for coming to the office today.  You received a Left and Right Knee Joint steroid injection today. - Lidocaine numbing medicine may ease the pain initially for a few hours until it wears off - As discussed, you may experience a "steroid flare" this evening or within 24-48 hours, anytime medicine is injected into an inflamed joint it can cause the pain to get worse temporarily - Everyone responds differently to these injections, it depends on the patient and the severity of the joint problem, it may provide anywhere from days to weeks, to months of relief. Ideal response is >6 months relief - Try to take it easy for next 1-2 days, avoid over activity and strain on joint (limit walking for knee) - Recommend the following:   - For swelling - rest, compression sleeve / ACE wrap, elevation, and ice packs as needed for first few days   - For pain in future may use heating pad or moist heat as needed  Medication  We will contact [facility] and follow up with them so that they can help you further with your pain.  Call them on Monday to follow up on their answer  Refilled Lorazepam on Monday evening 03/01/20 check with pharmacy  Recommend to start taking Tylenol Extra Strength 500mg  tabs - take 1 to 2 tabs per dose (max 1000mg ) every 6-8 hours for pain (take regularly, don't skip a dose for next 7 days), max 24 hour daily dose is 6 tablets or 3000mg . In the future you can repeat the same everyday Tylenol course for 1-2 weeks at a time.   Please schedule a Follow-up Appointment to: Return if symptoms worsen or fail to improve, for knee pain.  If you have any other questions or concerns, please feel free to call the office or send a message through [place]. You may also schedule an earlier appointment if necessary.  Additionally, you may be receiving a survey about your experience at our office within a few days to 1 week by e-mail or mail. We value your feedback.  [name], DO [place]

## 2020-03-05 NOTE — Telephone Encounter (Signed)
Dr. Dossie Arbour is out of office please check with Dr. Holley Raring

## 2020-03-08 NOTE — Telephone Encounter (Signed)
Pt has been r/s  

## 2020-03-09 ENCOUNTER — Other Ambulatory Visit: Payer: Self-pay

## 2020-03-09 ENCOUNTER — Encounter: Payer: Self-pay | Admitting: Student in an Organized Health Care Education/Training Program

## 2020-03-09 ENCOUNTER — Ambulatory Visit
Payer: Medicare Other | Attending: Student in an Organized Health Care Education/Training Program | Admitting: Student in an Organized Health Care Education/Training Program

## 2020-03-09 VITALS — BP 139/72 | HR 83 | Temp 97.2°F | Resp 16 | Ht 70.0 in | Wt 205.0 lb

## 2020-03-09 DIAGNOSIS — S83282S Other tear of lateral meniscus, current injury, left knee, sequela: Secondary | ICD-10-CM

## 2020-03-09 DIAGNOSIS — M25562 Pain in left knee: Secondary | ICD-10-CM | POA: Insufficient documentation

## 2020-03-09 DIAGNOSIS — G894 Chronic pain syndrome: Secondary | ICD-10-CM

## 2020-03-09 DIAGNOSIS — M1712 Unilateral primary osteoarthritis, left knee: Secondary | ICD-10-CM | POA: Diagnosis not present

## 2020-03-09 DIAGNOSIS — G8929 Other chronic pain: Secondary | ICD-10-CM

## 2020-03-09 DIAGNOSIS — M170 Bilateral primary osteoarthritis of knee: Secondary | ICD-10-CM | POA: Diagnosis not present

## 2020-03-09 DIAGNOSIS — S83242S Other tear of medial meniscus, current injury, left knee, sequela: Secondary | ICD-10-CM | POA: Diagnosis not present

## 2020-03-09 DIAGNOSIS — M25561 Pain in right knee: Secondary | ICD-10-CM | POA: Insufficient documentation

## 2020-03-09 MED ORDER — ORPHENADRINE CITRATE 30 MG/ML IJ SOLN
30.0000 mg | Freq: Once | INTRAMUSCULAR | Status: AC
  Administered 2020-03-09: 30 mg via INTRAMUSCULAR

## 2020-03-09 MED ORDER — TRAMADOL HCL 50 MG PO TABS: 50.0000 mg | ORAL_TABLET | Freq: Three times a day (TID) | ORAL | 0 refills | Status: DC | PRN

## 2020-03-09 MED ORDER — ORPHENADRINE CITRATE 30 MG/ML IJ SOLN
INTRAMUSCULAR | Status: AC
  Filled 2020-03-09: qty 2

## 2020-03-09 MED ORDER — KETOROLAC TROMETHAMINE 30 MG/ML IJ SOLN
30.0000 mg | Freq: Once | INTRAMUSCULAR | Status: AC
  Administered 2020-03-09: 30 mg via INTRAMUSCULAR

## 2020-03-09 MED ORDER — KETOROLAC TROMETHAMINE 30 MG/ML IJ SOLN
INTRAMUSCULAR | Status: AC
  Filled 2020-03-09: qty 1

## 2020-03-09 NOTE — Progress Notes (Signed)
Safety precautions to be maintained throughout the outpatient stay will include: orient to surroundings, keep bed in low position, maintain call bell within reach at all times, provide assistance with transfer out of bed and ambulation.  

## 2020-03-09 NOTE — Telephone Encounter (Signed)
Can you do anything? He takes Buprenorphine patch 10 mcg/hr. No other meds by Dr. Dossie Arbour.

## 2020-03-09 NOTE — Telephone Encounter (Signed)
Called patient, he is here for appt

## 2020-03-09 NOTE — Progress Notes (Signed)
PROVIDER NOTE: Information contained herein reflects review and annotations entered in association with encounter. Interpretation of such information and data should be left to medically-trained personnel. Information provided to patient can be located elsewhere in the medical record under "Patient Instructions". Document created using STT-dictation technology, any transcriptional errors that may result from process are unintentional.    Patient: Aaron Gibbs  Service Category: E/M  Provider: Gillis Santa, MD  DOB: 08-18-1934  DOS: 03/09/2020  Specialty: Interventional Pain Management  MRN: [id number]  Setting: Ambulatory outpatient  PCP: Olin Hauser, DO  Type: Established Patient    Referring Provider: [name] *  Location: Office  Delivery: Face-to-face     HPI  Reason for encounter: [name], a 85 y.o. year old male, is here today for evaluation and management of his Bilateral chronic knee pain [M25.561, M25.562, G89.29]. [name] primary complain today is Knee Pain (bilaterall left is worse ) Last encounter: Practice (03/04/2020).  Pertinent problems: [name] has Chronic pain syndrome; Tricompartment osteoarthritis of knee (Left); Osteoarthritis of knee (Bilateral); Osteoarthritis of patellofemoral joint (Right); Lateral meniscal tear, sequela (Left); Medial meniscal tear, sequela (Left); and Patellar tendinosis (Right) on their pertinent problem list. Pain Assessment: Severity of Chronic pain is reported as a 7 /10. Location: Knee Left, Right/denies. Onset: More than a month ago. Quality: Discomfort, Constant (intense pain especially at night). Timing: Constant. Modifying factor(s): nothing currently. Vitals:  height is '5\' 10"'  (1.778 m) and weight is 205 lb (93 kg). His temporal temperature is 97.2 F (36.2 C) (abnormal). His blood pressure is 139/72 and his pulse is 83. His respiration is 16 and oxygen saturation is 98%.   Aaron Gibbs is a pleasant  85 year old male who is a patient of my colleague, Dr. Dossie Arbour.  Dr. Dossie Arbour is out of office so I am covering for him.  Patient presents today with severe bilateral knee pain.  He has a history of severe tricompartmental knee osteoarthritis, left greater than right.  Patient has an upcoming genicular nerve radiofrequency ablation procedure with Dr. Dossie Arbour on March 23, 2020.  He has seen his primary care provider, Dr. Parks Ranger for exacerbation of his bilateral knee pain.  He received intra-articular knee steroid injections last week.  He states that they were not helpful.  He is currently on Butrans patch at 10 mcg an hour.  Pharmacotherapy Assessment   Analgesic: Butrans 10 mcg/hr   Monitoring: [place] PMP: PDMP not reviewed this encounter.       Pharmacotherapy: No side-effects or adverse reactions reported. Compliance: No problems identified. Effectiveness: Clinically acceptable.  Janett Billow, RN  03/09/2020  1:12 PM  Sign when Signing Visit Safety precautions to be maintained throughout the outpatient stay will include: orient to surroundings, keep bed in low position, maintain call bell within reach at all times, provide assistance with transfer out of bed and ambulation.     UDS:  Summary  Date Value Ref Range Status  02/02/2020 Note  Final    Comment:    ==================================================================== [facility] (MW) ==================================================================== Test                             Result       Flag       Units  Drug Present and Declared for Prescription Verification   Buprenorphine                  14  EXPECTED   ng/mg creat   Norbuprenorphine               22           EXPECTED   ng/mg creat    Source of buprenorphine is a scheduled prescription medication.    Norbuprenorphine is an expected metabolite of buprenorphine.  Drug Present not Declared for Prescription Verification   Oxazepam                        21           UNEXPECTED ng/mg creat    Oxazepam may be administered as a scheduled prescription medication;    it is also an expected metabolite of other benzodiazepine drugs,    including diazepam, chlordiazepoxide, prazepam, clorazepate,    halazepam, and temazepam.    Lorazepam                      730          UNEXPECTED ng/mg creat    Source of lorazepam is a scheduled prescription medication.  ==================================================================== Test                      Result    Flag   Units      Ref Range   Creatinine              105              mg/dL      >=20 ==================================================================== Declared Medications:  The flagging and interpretation on this report are based on the  following declared medications.  Unexpected results may arise from  inaccuracies in the declared medications.   **Note: The testing scope of this panel does not include small to  moderate amounts of these reported medications:   Buprenorphine Patch (BuTrans)   **Note: The testing scope of this panel does not include the  following reported medications:   Albuterol  Amlodipine  Aspirin  Diclofenac (Voltaren)  Duloxetine (Cymbalta)  Fenofibrate (TriCor)  Iron  Multivitamin  Pantoprazole (Protonix)  Risperidone  Timolol  Trazodone  Umeclidinium (Anoro)  Vilanterol (Anoro) ==================================================================== For clinical consultation, please call [phone]. ====================================================================      ROS  Constitutional: Denies any fever or chills Gastrointestinal: No reported hemesis, hematochezia, vomiting, or acute GI distress Musculoskeletal: Severe bilateral knee pain left greater than right Neurological: No reported episodes of acute onset apraxia, aphasia, dysarthria, agnosia, amnesia, paralysis, loss of coordination, or loss of  consciousness  Medication Review  DULoxetine, LORazepam, QUEtiapine, albuterol, amLODipine, aspirin EC, buprenorphine, diclofenac sodium, fenofibrate, ferrous sulfate, hydrOXYzine, multivitamin with minerals, mupirocin cream, pantoprazole, timolol, traMADol, traZODone, and umeclidinium-vilanterol  History Review  Allergy: [name] is allergic to azithromycin. Drug: [name]  reports no history of drug use. Alcohol:  reports previous alcohol use. Tobacco:  reports that he has quit smoking. He quit after 20.00 years of use. His smokeless tobacco use includes chew. Social: [name]  reports that he has quit smoking. He quit after 20.00 years of use. His smokeless tobacco use includes chew. He reports previous alcohol use. He reports that he does not use drugs. Medical:  has a past medical history of Anxiety, Cancer ([place]), COPD (chronic obstructive pulmonary disease) ([place]), Glaucoma, and Osteoporosis. Surgical: [name]  has a past surgical history that includes Cholecystectomy and Transurethral resection of prostate (N/A, 2008). Family: family history includes Depression in  his mother; Heart disease in his father.  Laboratory Chemistry Profile   Renal Lab Results  Component Value Date   BUN 25 (H) 02/09/2020   CREATININE 1.04 02/09/2020   LABCREA 631 12/13/2019   BCR 28 (H) 07/10/2018   GFRAA >60 02/09/2020   GFRNONAA >60 02/09/2020     Hepatic Lab Results  Component Value Date   AST 28 02/08/2020   ALT 29 02/08/2020   ALBUMIN 4.2 02/08/2020   ALKPHOS 48 02/08/2020   LIPASE 24 06/04/2018   AMMONIA 25 09/13/2019     Electrolytes Lab Results  Component Value Date   NA 136 02/09/2020   K 3.9 02/09/2020   CL 105 02/09/2020   CALCIUM 9.6 02/09/2020   MG 2.3 04/02/2019   PHOS 3.4 03/30/2019     Bone Lab Results  Component Value Date   VD25OH 26 08/15/2016   25OHVITD1 34 07/10/2018   25OHVITD2 <1.0 07/10/2018   25OHVITD3 34 07/10/2018     Inflammation  (CRP: Acute Phase) (ESR: Chronic Phase) Lab Results  Component Value Date   CRP 5 07/10/2018   ESRSEDRATE 20 07/10/2018   LATICACIDVEN 1.1 12/13/2019       Note: Above Lab results reviewed.  Recent Imaging Review  CT Chest Wo Contrast CLINICAL DATA:  Shortness of breath, abnormal chest x-ray  EXAM: CT CHEST WITHOUT CONTRAST  TECHNIQUE: Multidetector CT imaging of the chest was performed following the standard protocol without IV contrast.  COMPARISON:  Chest x-ray 12/26/2019, CT 12/07/2019  FINDINGS: Cardiovascular: Mild cardiomegaly. No pericardial effusion. Ascending thoracic aorta is mildly ectatic without aneurysmal dilation. Atherosclerotic calcifications of the aorta and coronary arteries.  Mediastinum/Nodes: Mildly enlarged lower right paratracheal lymph node measuring 10 mm short axis has slightly decreased from prior (series 2, image 53), previously 12 mm. No new or enlarging mediastinal lymph nodes. No evidence of axillary or hilar lymphadenopathy. Stable 1.5 cm right thyroid lobe nodule Recommend thyroid ultrasound (ref: J Am Coll Radiol. 2015 Feb;12(2): 143-50). Trachea and esophagus within normal limits.  Lungs/Pleura: Irregular nodular density within the lingula measuring 16 x 13 mm (series 3, image 80) appears slightly decreased in size compared to CT 12/07/2019. Persistent streaky ill-defined opacities adjacent to this nodular density. There are a few 2-3 mm subpleural nodular densities within the lingula, unchanged from prior. Lung fields are otherwise clear. No pleural effusion or pneumothorax.  Upper Abdomen: No acute findings within the visualized upper abdomen.  Musculoskeletal: Markedly exaggerated thoracic kyphosis with ankylosis of the visualized cervical and thoracic vertebral levels as well as ossification of the anterior longitudinal ligament. No new or acute osseous findings are identified.  IMPRESSION: 1. Irregular nodular density  within the lingula measuring 16 x 13 mm appears slightly decreased in size compared to CT 12/07/2019 (previously measured 17 x 16 mm). Although this finding could represent an infectious or inflammatory process, malignancy is not excluded and continued surveillance is recommended with short-term follow-up CT in 3 months. 2. Right lung is clear. No findings to correspond with previously described abnormality seen on chest radiograph. 3. Stable 1.5 cm right thyroid lobe nodule. Recommend thyroid ultrasound (ref: J Am Coll Radiol. 4. Aortic Atherosclerosis (ICD10-I70.0).  Electronically Signed   By: Davina Poke D.O.   On: 12/26/2019 12:55 DG Chest 2 View CLINICAL DATA:  Shortness of breath  EXAM: CHEST - 2 VIEW  COMPARISON:  12/13/2019  FINDINGS: The patient is rotated to the right on today's radiograph, reducing diagnostic sensitivity and specificity.  Indistinct left pulmonary  nodule, lung cancer not excluded. There is some vague opacity around this nodule mildly obscuring its margins.  Indistinct nodular density at the right costophrenic angle, nonspecific. This could be from localized airspace opacity or another true pulmonary nodule.  No blunting of the lateral costophrenic angles. Upper normal heart size. Thoracic kyphosis and spondylosis.  IMPRESSION: 1. Indistinct left basilar pulmonary nodule, lung cancer not excluded. This is slightly less sharply defined than on the prior exam, possibly due to surrounding airspace opacity 2. New nodular density at the right costophrenic angle, nonspecific. 3. Thoracic kyphosis and spondylosis. 4. No overt edema or pleural effusion.  Electronically Signed   By: Van Clines M.D.   On: 12/26/2019 10:51 Note: Reviewed        Physical Exam  General appearance: alert, cooperative and in mild distress Mental status: Alert, oriented x 3 (person, place, & time)       Respiratory: No evidence of acute respiratory  distress Eyes: PERLA Vitals: BP 139/72 (BP Location: Right Arm, Patient Position: Sitting, Cuff Size: Normal)   Pulse 83   Temp (!) 97.2 F (36.2 C) (Temporal)   Resp 16   Ht '5\' 10"'  (1.778 m)   Wt 205 lb (93 kg)   SpO2 98%   BMI 29.41 kg/m  BMI: Estimated body mass index is 29.41 kg/m as calculated from the following:   Height as of this encounter: '5\' 10"'  (1.778 m).   Weight as of this encounter: 205 lb (93 kg). Ideal: Ideal body weight: 73 kg (160 lb 15 oz) Adjusted ideal body weight: 81 kg (178 lb 9 oz)  Lumbar Spine Area Exam  Skin & Axial Inspection: No masses, redness, or swelling Alignment: Symmetrical Functional ROM: Pain restricted ROM       Stability: No instability detected Muscle Tone/Strength: Functionally intact. No obvious neuro-muscular anomalies detected. Sensory (Neurological): Musculoskeletal pain pattern  Gait & Posture Assessment  Ambulation: Patient came in today in a wheel chair Gait: Limited. Using assistive device to ambulate Posture: Difficulty standing up straight, due to pain  Lower Extremity Exam    Side: Right lower extremity  Side: Left lower extremity  Stability: No instability observed          Stability: No instability observed          Skin & Extremity Inspection: Skin color, temperature, and hair growth are WNL. No peripheral edema or cyanosis. No masses, redness, swelling, asymmetry, or associated skin lesions. No contractures.  Skin & Extremity Inspection: Skin color, temperature, and hair growth are WNL. No peripheral edema or cyanosis. No masses, redness, swelling, asymmetry, or associated skin lesions. No contractures.  Functional ROM: Pain restricted ROM for hip and knee joints          Functional ROM: Pain restricted ROM for hip and knee joints          Muscle Tone/Strength: Functionally intact. No obvious neuro-muscular anomalies detected.  Muscle Tone/Strength: Functionally intact. No obvious neuro-muscular anomalies detected.   Sensory (Neurological): Musculoskeletal pain pattern        Sensory (Neurological): Musculoskeletal pain pattern        DTR: Patellar: deferred today Achilles: deferred today Plantar: deferred today  DTR: Patellar: deferred today Achilles: deferred today Plantar: deferred today  Palpation: No palpable anomalies  Palpation: No palpable anomalies    Assessment   Status Diagnosis  Having a Flare-up Having a Flare-up Having a Flare-up 1. Chronic knee pain (Primary Area of Pain) (Bilateral) (L>R)   2. Osteoarthritis of  knee (Bilateral)   3. Tricompartment osteoarthritis of knee (Left)   4. Medial meniscal tear, sequela (Left)   5. Lateral meniscal tear, sequela (Left)   6. Chronic pain syndrome      Updated Problems: Problem  Medial meniscal tear, sequela (Left)  Patellar tendinosis (Right)  Lateral meniscal tear, sequela (Left)  Tricompartment osteoarthritis of knee (Left)  Osteoarthritis of knee (Bilateral)  Osteoarthritis of patellofemoral joint (Right)  Chronic Pain Syndrome    Plan of Care  Aaron Gibbs has a current medication list which includes the following long-term medication(s): albuterol, amlodipine, buprenorphine, [START ON 03/24/2020] buprenorphine, [START ON 04/21/2020] buprenorphine, fenofibrate, ferrous sulfate, pantoprazole, trazodone, and duloxetine.   Acute on chronic bilateral knee pain, left greater than right related to severe tricompartmental knee osteoarthritis.  Patient has upcoming genicular nerve radiofrequency ablation procedure with Dr. Dossie Arbour at the beginning of October.  He is status post bilateral knee steroid injections with his primary care provider last week which he states were not effective.  At this point, I want to avoid additional steroid therapy.  Patient's kidney function within normal limits.  Discussed intramuscular Norflex and Toradol.  Risks and benefits reviewed and patient would like to proceed.  Patient continues Butrans  patch at 10 mcg an hour.  For his acute on chronic pain, recommend tramadol 50 mg 3 times daily as needed for breakthrough pain that he can take in addition to his Butrans transdermal patch.   Pharmacotherapy (Medications Ordered): Meds ordered this encounter  Medications  . orphenadrine (NORFLEX) injection 30 mg  . ketorolac (TORADOL) 30 MG/ML injection 30 mg  . traMADol (ULTRAM) 50 MG tablet    Sig: Take 1 tablet (50 mg total) by mouth 3 (three) times daily as needed for severe pain. Month last 30 days.    Dispense:  90 tablet    Refill:  0    [place] STOP ACT - Not applicable. Fill one day early if pharmacy is closed on scheduled refill date.   Follow-up plan:   Return for Keep sch. appt with Dr Delane Ginger.   Recent Visits Date Type Provider Dept  02/02/20 Office Visit Milinda Pointer, MD [facility]  12/31/19 Office Visit Milinda Pointer, MD [facility]  Showing recent visits within past 90 days and meeting all other requirements Today's Visits Date Type Provider Dept  03/09/20 Office Visit Gillis Santa, MD [facility]  Showing today's visits and meeting all other requirements Future Appointments Date Type Provider Dept  03/23/20 Appointment Milinda Pointer, MD [facility]  04/06/20 Appointment Milinda Pointer, MD [facility]  05/05/20 Appointment Milinda Pointer, MD [facility]  Showing future appointments within next 90 days and meeting all other requirements  I discussed the assessment and treatment plan with the patient. The patient was provided an opportunity to ask questions and all were answered. The patient agreed with the plan and demonstrated an understanding of the instructions.  Patient advised to call back or seek an in-person evaluation if the symptoms or condition worsens.  Duration of encounter: 57mnutes.  Note by: BGillis Santa MD Date: 03/09/2020; Time: 1:48 PM

## 2020-03-10 ENCOUNTER — Telehealth: Payer: Self-pay | Admitting: *Deleted

## 2020-03-10 NOTE — Telephone Encounter (Signed)
[facility] called to verify Tramadol Rx and that this was appropriate with the patient already being on Butrans patches.  Explained that patient had come in yesterday for evaluation of increased knee pain and that this was prescribed by Dr Holley Raring for breakthrough pain. Pharmacist verbalizes u/o information.

## 2020-03-23 ENCOUNTER — Other Ambulatory Visit: Payer: Self-pay

## 2020-03-23 ENCOUNTER — Ambulatory Visit
Admission: RE | Admit: 2020-03-23 | Discharge: 2020-03-23 | Disposition: A | Discharge status: Home / Self Care | Payer: Medicare Other | Source: Ambulatory Visit | Attending: Pain Medicine | Admitting: Pain Medicine

## 2020-03-23 ENCOUNTER — Ambulatory Visit (HOSPITAL_BASED_OUTPATIENT_CLINIC_OR_DEPARTMENT_OTHER): Payer: Medicare Other | Admitting: Pain Medicine

## 2020-03-23 ENCOUNTER — Encounter: Payer: Self-pay | Admitting: Pain Medicine

## 2020-03-23 VITALS — BP 110/50 | HR 65 | Temp 97.4°F | Resp 14 | Ht 70.0 in | Wt 205.0 lb

## 2020-03-23 DIAGNOSIS — M25561 Pain in right knee: Secondary | ICD-10-CM | POA: Diagnosis present

## 2020-03-23 DIAGNOSIS — M25562 Pain in left knee: Secondary | ICD-10-CM

## 2020-03-23 DIAGNOSIS — M1712 Unilateral primary osteoarthritis, left knee: Secondary | ICD-10-CM

## 2020-03-23 DIAGNOSIS — S83242S Other tear of medial meniscus, current injury, left knee, sequela: Secondary | ICD-10-CM

## 2020-03-23 DIAGNOSIS — S83282S Other tear of lateral meniscus, current injury, left knee, sequela: Secondary | ICD-10-CM | POA: Diagnosis present

## 2020-03-23 DIAGNOSIS — G8929 Other chronic pain: Secondary | ICD-10-CM | POA: Diagnosis not present

## 2020-03-23 DIAGNOSIS — M170 Bilateral primary osteoarthritis of knee: Secondary | ICD-10-CM | POA: Diagnosis present

## 2020-03-23 MED ORDER — LACTATED RINGERS IV SOLN
1000.0000 mL | Freq: Once | INTRAVENOUS | Status: AC
  Administered 2020-03-23: 1000 mL via INTRAVENOUS

## 2020-03-23 MED ORDER — METHYLPREDNISOLONE ACETATE 80 MG/ML IJ SUSP
80.0000 mg | Freq: Once | INTRAMUSCULAR | Status: AC
  Administered 2020-03-23: 80 mg
  Filled 2020-03-23: qty 1

## 2020-03-23 MED ORDER — LIDOCAINE HCL 2 % IJ SOLN
20.0000 mL | Freq: Once | INTRAMUSCULAR | Status: AC
  Administered 2020-03-23: 400 mg

## 2020-03-23 MED ORDER — ROPIVACAINE HCL 2 MG/ML IJ SOLN
9.0000 mL | Freq: Once | INTRAMUSCULAR | Status: AC
  Administered 2020-03-23: 9 mL via PERINEURAL
  Filled 2020-03-23: qty 10

## 2020-03-23 MED ORDER — FENTANYL CITRATE (PF) 100 MCG/2ML IJ SOLN
25.0000 ug | INTRAMUSCULAR | Status: DC | PRN
  Administered 2020-03-23: 50 ug via INTRAVENOUS
  Filled 2020-03-23: qty 2

## 2020-03-23 MED ORDER — MIDAZOLAM HCL 5 MG/5ML IJ SOLN
1.0000 mg | INTRAMUSCULAR | Status: DC | PRN
  Administered 2020-03-23: 1 mg via INTRAVENOUS
  Filled 2020-03-23: qty 5

## 2020-03-23 NOTE — Progress Notes (Signed)
PROVIDER NOTE: Information contained herein reflects review and annotations entered in association with encounter. Interpretation of such information and data should be left to medically-trained personnel. Information provided to patient can be located elsewhere in the medical record under "Patient Instructions". Document created using STT-dictation technology, any transcriptional errors that may result from process are unintentional.    Patient: Aaron Gibbs  Service Category: Procedure  Provider: Gaspar Cola, MD  DOB: 1934/10/26  DOS: 03/23/2020  Location: [facility]  MRN: [id number]  Setting: Ambulatory - outpatient  Referring Provider: Milinda Pointer, MD  Type: Established Patient  Specialty: Interventional Pain Management  PCP: Olin Hauser, DO   Primary Reason for Visit: Interventional Pain Management Treatment. CC: Knee Pain (left )  Procedure:          Anesthesia, Analgesia, Anxiolysis:  Type: Therapeutic Superolateral, Superomedial, and Inferomedial, Genicular Nerve Radiofrequency Ablation (destruction).   #2  Region: Lateral, Anterior, and Medial aspects of the knee joint, above and below the knee joint proper. Level: Superior and inferior to the knee joint. Laterality: Left  Type: Moderate (Conscious) Sedation combined with Local Anesthesia Indication(s): Analgesia and Anxiety Route: Intravenous (IV) IV Access: Secured Sedation: Meaningful verbal contact was maintained at all times during the procedure  Local Anesthetic: Lidocaine 1-2%  Position: Supine   Indications: 1. Chronic knee pain (Primary Area of Pain) (Bilateral) (L>R)   2. Osteoarthritis of knee (Left)   3. Tricompartment osteoarthritis of knee (Left)   4. Lateral meniscal tear, sequela (Left)   5. Medial meniscal tear, sequela (Left)   6. Osteoarthritis of knee (Bilateral)    Mr. Clear has been dealing with the above chronic pain for longer than three months and has  either failed to respond, was unable to tolerate, or simply did not get enough benefit from other more conservative therapies including, but not limited to: 1. Over-the-counter medications 2. Anti-inflammatory medications 3. Muscle relaxants 4. Membrane stabilizers 5. Opioids 6. Physical therapy and/or chiropractic manipulation 7. Modalities (Heat, ice, etc.) 8. Invasive techniques such as nerve blocks. Mr. Guilmette has attained more than 50% relief of the pain from a series of diagnostic injections conducted in separate occasions.  Pain Score: Pre-procedure: 6 /10 Post-procedure: 0-No pain/10  Pharmacotherapy Assessment   Analgesic: No opioid analgesics prescribed by our practice.  Buprenorphine 10 mcg/h patch q. 7 days.  High risk for SUD.  History of suicidal attempts and noncompliance with medication intake. ED visits thought to be due to medication binging. MME/day: 0 mg/day.   Monitoring: [place] PMP: PDMP reviewed during this encounter.       Pharmacotherapy: No side-effects or adverse reactions reported. Compliance: No problems identified. Effectiveness: Clinically acceptable.  UDS:  Summary  Date Value Ref Range Status  02/02/2020 Note  Final    Comment:    ==================================================================== [employer] Select 13 (MW) ==================================================================== Test                             Result       Flag       Units  Drug Present and Declared for Prescription Verification   Buprenorphine                  14           EXPECTED   ng/mg creat   Norbuprenorphine               22  EXPECTED   ng/mg creat    Source of buprenorphine is a scheduled prescription medication.    Norbuprenorphine is an expected metabolite of buprenorphine.  Drug Present not Declared for Prescription Verification   Oxazepam                       21           UNEXPECTED ng/mg creat    Oxazepam may be administered as a scheduled  prescription medication;    it is also an expected metabolite of other benzodiazepine drugs,    including diazepam, chlordiazepoxide, prazepam, clorazepate,    halazepam, and temazepam.    Lorazepam                      730          UNEXPECTED ng/mg creat    Source of lorazepam is a scheduled prescription medication.  ==================================================================== Test                      Result    Flag   Units      Ref Range   Creatinine              105              mg/dL      >=20 ==================================================================== Declared Medications:  The flagging and interpretation on this report are based on the  following declared medications.  Unexpected results may arise from  inaccuracies in the declared medications.   **Note: The testing scope of this panel does not include small to  moderate amounts of these reported medications:   Buprenorphine Patch (BuTrans)   **Note: The testing scope of this panel does not include the  following reported medications:   Albuterol  Amlodipine  Aspirin  Diclofenac (Voltaren)  Duloxetine (Cymbalta)  Fenofibrate (TriCor)  Iron  Multivitamin  Pantoprazole (Protonix)  Risperidone  Timolol  Trazodone  Umeclidinium (Anoro)  Vilanterol (Anoro) ==================================================================== For clinical consultation, please call [phone]. ====================================================================      Pre-op Assessment:  Mr. Aday is a 85 y.o. (year old), male patient, seen today for interventional treatment. He  has a past surgical history that includes Cholecystectomy and Transurethral resection of prostate (N/A, 2008). Mr. Drapeau has a current medication list which includes the following prescription(s): albuterol, amlodipine, buprenorphine, [START ON 03/24/2020] buprenorphine, [START ON 04/21/2020] buprenorphine, diclofenac sodium, fenofibrate,  ferrous sulfate, hydroxyzine, lorazepam, multivitamin with minerals, mupirocin cream, pantoprazole, prednisolone acetate, quetiapine, timolol, tramadol, trazodone, umeclidinium-vilanterol, aspirin ec, and duloxetine, and the following Facility-Administered Medications: fentanyl and midazolam. His primarily concern today is the Knee Pain (left )  Initial Vital Signs:  Pulse/HCG Rate: 65ECG Heart Rate: 62 Temp: (!) 97.4 F (36.3 C) Resp: 16 BP: 130/67 SpO2: 98 %  BMI: Estimated body mass index is 29.41 kg/m as calculated from the following:   Height as of this encounter: 5\' 10"  (1.778 m).   Weight as of this encounter: 205 lb (93 kg).  Risk Assessment: Allergies: Reviewed. He is allergic to azithromycin.  Allergy Precautions: None required Coagulopathies: Reviewed. None identified.  Blood-thinner therapy: None at this time Active Infection(s): Reviewed. None identified. Mr. Campanaro is afebrile  Site Confirmation: Mr. Muegge was asked to confirm the procedure and laterality before marking the site Procedure checklist: Completed Consent: Before the procedure and under the influence of no sedative(s), amnesic(s), or anxiolytics, the patient was informed of the treatment options, risks  and possible complications. To fulfill our ethical and legal obligations, as recommended by the American Medical Association's Code of Ethics, I have informed the patient of my clinical impression; the nature and purpose of the treatment or procedure; the risks, benefits, and possible complications of the intervention; the alternatives, including doing nothing; the risk(s) and benefit(s) of the alternative treatment(s) or procedure(s); and the risk(s) and benefit(s) of doing nothing. The patient was provided information about the general risks and possible complications associated with the procedure. These may include, but are not limited to: failure to achieve desired goals, infection, bleeding, organ or nerve  damage, allergic reactions, paralysis, and death. In addition, the patient was informed of those risks and complications associated to the procedure, such as failure to decrease pain; infection; bleeding; organ or nerve damage with subsequent damage to sensory, motor, and/or autonomic systems, resulting in permanent pain, numbness, and/or weakness of one or several areas of the body; allergic reactions; (i.e.: anaphylactic reaction); and/or death. Furthermore, the patient was informed of those risks and complications associated with the medications. These include, but are not limited to: allergic reactions (i.e.: anaphylactic or anaphylactoid reaction(s)); adrenal axis suppression; blood sugar elevation that in diabetics may result in ketoacidosis or comma; water retention that in patients with history of congestive heart failure may result in shortness of breath, pulmonary edema, and decompensation with resultant heart failure; weight gain; swelling or edema; medication-induced neural toxicity; particulate matter embolism and blood vessel occlusion with resultant organ, and/or nervous system infarction; and/or aseptic necrosis of one or more joints. Finally, the patient was informed that Medicine is not an exact science; therefore, there is also the possibility of unforeseen or unpredictable risks and/or possible complications that may result in a catastrophic outcome. The patient indicated having understood very clearly. We have given the patient no guarantees and we have made no promises. Enough time was given to the patient to ask questions, all of which were answered to the patient's satisfaction. Mr. Barna has indicated that he wanted to continue with the procedure. Attestation: I, the ordering provider, attest that I have discussed with the patient the benefits, risks, side-effects, alternatives, likelihood of achieving goals, and potential problems during recovery for the procedure that I have  provided informed consent. Date  Time: 03/23/2020 10:17 AM  Pre-Procedure Preparation:  Monitoring: As per clinic protocol. Respiration, ETCO2, SpO2, BP, heart rate and rhythm monitor placed and checked for adequate function Safety Precautions: Patient was assessed for positional comfort and pressure points before starting the procedure. Time-out: I initiated and conducted the "Time-out" before starting the procedure, as per protocol. The patient was asked to participate by confirming the accuracy of the "Time Out" information. Verification of the correct person, site, and procedure were performed and confirmed by me, the nursing staff, and the patient. "Time-out" conducted as per Joint Commission's Universal Protocol (UP.01.01.01). Time: 1115  Description of Procedure:          Target Area: For Genicular Nerve radiofrequency ablation (destruction), the targets are: the superolateral genicular nerve, located in the lateral distal portion of the femoral shaft as it curves to form the lateral epicondyle, in the region of the distal femoral metaphysis; the superomedial genicular nerve, located in the medial distal portion of the femoral shaft as it curves to form the medial epicondyle; and the inferomedial genicular nerve, located in the medial, proximal portion of the tibial shaft, as it curves to form the medial epicondyle, in the region of the proximal tibial metaphysis.  Approach: Anterior, ipsilateral approach. Area Prepped: Entire knee area, from mid-thigh to mid-shin, lateral, anterior, and medial aspects. DuraPrep (Iodine Povacrylex [0.7% available iodine] and Isopropyl Alcohol, 74% w/w) Safety Precautions: Aspiration looking for blood return was conducted prior to all injections. At no point did we inject any substances, as a needle was being advanced. No attempts were made at seeking any paresthesias. Safe injection practices and needle disposal techniques used. Medications properly checked for  expiration dates. SDV (single dose vial) medications used. Description of the Procedure: Protocol guidelines were followed. The patient was placed in position over the procedure table. The target area was identified and the area prepped in the usual manner. The skin and muscle were infiltrated with local anesthetic. Appropriate amount of time allowed to pass for local anesthetics to take effect. Radiofrequency needles were introduced to the target area using fluoroscopic guidance. Using the NeuroTherm NT1100 Radiofrequency Generator, sensory stimulation using 50 Hz was used to locate & identify the nerve, making sure that the needle was positioned such that there was no sensory stimulation below 0.3 V or above 0.7 V. Stimulation using 2 Hz was used to evaluate the motor component. Care was taken not to lesion any nerves that demonstrated motor stimulation of the lower extremities at an output of less than 2.5 times that of the sensory threshold, or a maximum of 2.0 V. Once satisfactory placement of the needles was achieved, the numbing solution was slowly injected after negative aspiration. After waiting for at least 2 minutes, the ablation was performed at 80 degrees C for 60 seconds, using regular Radiofrequency settings. Once the procedure was completed, the needles were then removed and the area cleansed, making sure to leave some of the prepping solution back to take advantage of its long term bactericidal properties. Intra-operative Compliance: Compliant Vitals:   03/23/20 1145 03/23/20 1155 03/23/20 1205 03/23/20 1215  BP: 119/63 (!) 110/51 (!) 111/46 (!) 110/50  Pulse:      Resp: 18 14 12 14   Temp:  (!) 97.5 F (36.4 C)  (!) 97.4 F (36.3 C)  TempSrc:      SpO2: 100% 96% 97% 96%  Weight:      Height:        Start Time: 1115 hrs. End Time: 1145 hrs. Materials & Medications:  Needle(s) Type: Teflon-coated, curved tip, Radiofrequency needle(s) Gauge: 22G Length: 10cm Medication(s): Please  see orders for medications and dosing details.  Imaging Guidance (Non-Spinal):          Type of Imaging Technique: Fluoroscopy Guidance (Non-Spinal) Indication(s): Assistance in needle guidance and placement for procedures requiring needle placement in or near specific anatomical locations not easily accessible without such assistance. Exposure Time: Please see nurses notes. Contrast: Before injecting any contrast, we confirmed that the patient did not have an allergy to iodine, shellfish, or radiological contrast. Once satisfactory needle placement was completed at the desired level, radiological contrast was injected. Contrast injected under live fluoroscopy. No contrast complications. See chart for type and volume of contrast used. Fluoroscopic Guidance: I was personally present during the use of fluoroscopy. "Tunnel Vision Technique" used to obtain the best possible view of the target area. Parallax error corrected before commencing the procedure. "Direction-depth-direction" technique used to introduce the needle under continuous pulsed fluoroscopy. Once target was reached, antero-posterior, oblique, and lateral fluoroscopic projection used confirm needle placement in all planes. Images permanently stored in EMR. Interpretation: I personally interpreted the imaging intraoperatively. Adequate needle placement confirmed in multiple planes. Appropriate spread of  contrast into desired area was observed. No evidence of afferent or efferent intravascular uptake. Permanent images saved into the patient's record.  Antibiotic Prophylaxis:   Anti-infectives (From admission, onward)   None     Indication(s): None identified  Post-operative Assessment:  Post-procedure Vital Signs:  Pulse/HCG Rate: 6560 Temp: (!) 97.4 F (36.3 C) Resp: 14 BP: (!) 110/50 SpO2: 96 %  EBL: None  Complications: No immediate post-treatment complications observed by team, or reported by patient.  Note: The patient  tolerated the entire procedure well. A repeat set of vitals were taken after the procedure and the patient was kept under observation following institutional policy, for this type of procedure. Post-procedural neurological assessment was performed, showing return to baseline, prior to discharge. The patient was provided with post-procedure discharge instructions, including a section on how to identify potential problems. Should any problems arise concerning this procedure, the patient was given instructions to immediately contact us, at any time, without hesitation. In any case, we plan to contact the patient by telephone for a follow-up status report regarding this interventional procedure.  Comments:  No additional relevant information.  Plan of Care  Orders:  Orders Placed This Encounter  Procedures  . Radiofrequency,Genicular    Scheduling Instructions:     Side(s): Left Knee     Level(s): Superior-Lateral, Superior-Medial, and Inferior-Medial Genicular Nerve(s)     Sedation: Patient's choice.     Timeframe: Today    Order Specific Question:   Where will this procedure be performed?    Answer:   [facility]  . Radiofrequency,Genicular    Standing Status:   Future    Standing Expiration Date:   03/23/2021    Scheduling Instructions:     Side(s): Right Knee     Level(s): Superior-Lateral, Superior-Medial, and Inferior-Medial Genicular Nerve(s)     Sedation: Patient's choice.     Scheduling Timeframe: 2 weeks from now    Order Specific Question:   Where will this procedure be performed?    Answer:   [facility]  . DG [facility] C-ARM 1-60 MIN NO REPORT    Intraoperative interpretation by procedural physician at [facility].    Standing Status:   Standing    Number of Occurrences:   1    Order Specific Question:   Reason for exam:    Answer:   Assistance in needle guidance and placement for procedures requiring needle placement in or near specific  anatomical locations not easily accessible without such assistance.  . Informed Consent Details: Physician/Practitioner Attestation; Transcribe to consent form and obtain patient signature    Provider Attestation: I, [place] Dossie Arbour, MD, (Pain Management Specialist), the physician/practitioner, attest that I have discussed with the patient the benefits, risks, side effects, alternatives, likelihood of achieving goals and potential problems during recovery for the procedure that I have provided informed consent.    Scheduling Instructions:     Procedure: Left genicular nerve radiofrequency ablation     Indications: Chronic left knee pain     Note: Always confirm laterality of pain with Mr. Lusk, before procedure.     Transcribe to consent form and obtain patient signature.  . Provide equipment / supplies at bedside    "Radiofrequency Tray"; Large hemostat (x1); Small hemostat (x1); Towels (x8); 4x4 sterile sponge pack (x1) Needle type: Teflon-coated Radiofrequency Needle (Disposable  single use) Size: Regular Quantity: 3    Standing Status:   Standing    Number of Occurrences:   1  Order Specific Question:   Specify    Answer:   Radiofrequency Tray   Chronic Opioid Analgesic:  No opioid analgesics prescribed by our practice.  Buprenorphine 10 mcg/h patch q. 7 days.  This apparently has been taken over by Leonard Downing, PA. MME/day: 0 mg/day.   Medications ordered for procedure: Meds ordered this encounter  Medications  . lidocaine (XYLOCAINE) 2 % (with pres) injection 400 mg  . lactated ringers infusion 1,000 mL  . midazolam (VERSED) 5 MG/5ML injection 1-2 mg    Make sure Flumazenil is available in the pyxis when using this medication. If oversedation occurs, administer 0.2 mg IV over 15 sec. If after 45 sec no response, administer 0.2 mg again over 1 min; may repeat at 1 min intervals; not to exceed 4 doses (1 mg)  . fentaNYL (SUBLIMAZE) injection 25-50 mcg    Make sure  Narcan is available in the pyxis when using this medication. In the event of respiratory depression (RR< 8/min): Titrate NARCAN (naloxone) in increments of 0.1 to 0.2 mg IV at 2-3 minute intervals, until desired degree of reversal.  . methylPREDNISolone acetate (DEPO-MEDROL) injection 80 mg  . ropivacaine (PF) 2 mg/mL (0.2%) (NAROPIN) injection 9 mL   Medications administered: We administered lidocaine, lactated ringers, midazolam, fentaNYL, methylPREDNISolone acetate, and ropivacaine (PF) 2 mg/mL (0.2%).  See the medical record for exact dosing, route, and time of administration.  Follow-up plan:   Return in about 2 weeks (around 04/06/2020) for Radio-Frequency: (R) Genicular RFA #2.       Interventional management options: Considering: Palliative treatments seen below.   PRN Procedures: Palliative bilateral IA Hyalgan knee injections S3/N1  Palliative bilateral IA knee injections (w/ steroids) #1  Diagnostic bilateral genicular NB #2  Palliative bilateral genicular nerve RFA #2 (last done 06/10/2019)     Recent Visits Date Type Provider Dept  03/09/20 Office Visit Gillis Santa, MD [facility]  02/02/20 Office Visit Milinda Pointer, MD [facility]  12/31/19 Office Visit Milinda Pointer, MD [facility]  Showing recent visits within past 90 days and meeting all other requirements Today's Visits Date Type Provider Dept  03/23/20 Procedure visit Milinda Pointer, MD [facility]  Showing today's visits and meeting all other requirements Future Appointments Date Type Provider Dept  04/05/20 Appointment Milinda Pointer, MD [facility]  04/06/20 Appointment Milinda Pointer, MD [facility]  05/05/20 Appointment Milinda Pointer, MD [facility]  Showing future appointments within next 90 days and meeting all other requirements  Disposition: Discharge home  Discharge (Date  Time): 03/23/2020; 1225  hrs.   Primary Care Physician: Olin Hauser, DO Location: [facility] Note by: Gaspar Cola, MD Date: 03/23/2020; Time: 12:52 PM  Disclaimer:  Medicine is not an Chief Strategy Officer. The only guarantee in medicine is that nothing is guaranteed. It is important to note that the decision to proceed with this intervention was based on the information collected from the patient. The Data and conclusions were drawn from the patient's questionnaire, the interview, and the physical examination. Because the information was provided in large part by the patient, it cannot be guaranteed that it has not been purposely or unconsciously manipulated. Every effort has been made to obtain as much relevant data as possible for this evaluation. It is important to note that the conclusions that lead to this procedure are derived in large part from the available data. Always take into account that the treatment will  also be dependent on availability of resources and existing treatment guidelines, considered by other Pain Management Practitioners as being common knowledge and practice, at the time of the intervention. For Medico-Legal purposes, it is also important to point out that variation in procedural techniques and pharmacological choices are the acceptable norm. The indications, contraindications, technique, and results of the above procedure should only be interpreted and judged by a Board-Certified Interventional Pain Specialist with extensive familiarity and expertise in the same exact procedure and technique.

## 2020-03-23 NOTE — Progress Notes (Signed)
Safety precautions to be maintained throughout the outpatient stay will include: orient to surroundings, keep bed in low position, maintain call bell within reach at all times, provide assistance with transfer out of bed and ambulation.  

## 2020-03-23 NOTE — Patient Instructions (Addendum)
___________________________________________________________________________________________  Post-Radiofrequency (RF) Discharge Instructions  You have just completed a Radiofrequency Neurotomy.  The following instructions will provide you with information and guidelines for self-care upon discharge.  If at any time you have questions or concerns please call your physician. DO NOT DRIVE YOURSELF!!  Instructions:  Apply ice: Fill a plastic sandwich bag with crushed ice. Cover it with a small towel and apply to injection site. Apply for 15 minutes then remove x 15 minutes. Repeat sequence on day of procedure, until you go to bed. The purpose is to minimize swelling and discomfort after procedure.  Apply heat: Apply heat to procedure site starting the day following the procedure. The purpose is to treat any soreness and discomfort from the procedure.  Food intake: No eating limitations, unless stipulated above.  Nevertheless, if you have had sedation, you may experience some nausea.  In this case, it may be wise to wait at least two hours prior to resuming regular diet.  Physical activities: Keep activities to a minimum for the first 8 hours after the procedure. For the first 24 hours after the procedure, do not drive a motor vehicle,  Operate heavy machinery, power tools, or handle any weapons.  Consider walking with the use of an assistive device or accompanied by an adult for the first 24 hours.  Do not drink alcoholic beverages including beer.  Do not make any important decisions or sign any legal documents. Go home and rest today.  Resume activities tomorrow, as tolerated.  Use caution in moving about as you may experience mild leg weakness.  Use caution in cooking, use of household electrical appliances and climbing steps.  Driving: If you have received any sedation, you are not allowed to drive for 24 hours after your procedure.  Blood thinner: Restart your blood thinner 6 hours after your  procedure. (Only for those taking blood thinners)  Insulin: As soon as you can eat, you may resume your normal dosing schedule. (Only for those taking insulin)  Medications: May resume pre-procedure medications.  Do not take any drugs, other than what has been prescribed to you.  Infection prevention: Keep procedure site clean and dry.  Post-procedure Pain Diary: Extremely important that this be done correctly and accurately. Recorded information will be used to determine the next step in treatment.  Pain evaluated is that of treated area only. Do not include pain from an untreated area.  Complete every hour, on the hour, for the initial 8 hours. Set an alarm to help you do this part accurately.  Do not go to sleep and have it completed later. It will not be accurate.  Follow-up appointment: Keep your follow-up appointment after the procedure. Usually 2-6 weeks after radiofrequency. Bring you pain diary. The information collected will be essential for your long-term care.   Expect:  From numbing medicine (AKA: Local Anesthetics): Numbness or decrease in pain.  Onset: Full effect within 15 minutes of injected.  Duration: It will depend on the type of local anesthetic used. On the average, 1 to 8 hours.   From steroids (when added): Decrease in swelling or inflammation. Once inflammation is improved, relief of the pain will follow.  Onset of benefits: Depends on the amount of swelling present. The more swelling, the longer it will take for the benefits to be seen. In some cases, up to 10 days.  Duration: Steroids will stay in the system x 2 weeks. Duration of benefits will depend on multiple posibilities including persistent irritating factors.    From procedure: Some discomfort is to be expected once the numbing medicine wears off. In the case of radiofrequency procedures, this may last as long as 6 weeks. Additional post-procedure pain medication is provided for this. Discomfort is  minimized if ice and heat are applied as instructed.  Call if:  You experience numbness and weakness that gets worse with time, as opposed to wearing off.  He experience any unusual bleeding, difficulty breathing, or loss of the ability to control your bowel and bladder. (This applies to Spinal procedures only)  You experience any redness, swelling, heat, red streaks, elevated temperature, fever, or any other signs of a possible infection.  Emergency Numbers:  Durning business hours (Monday - Thursday, 8:00 AM - 4:00 PM) (Friday, 9:00 AM - 12:00 Noon): [phone]  After hours: [phone] ____________________________________________________________________________________________   ____________________________________________________________________________________________  Preparing for Procedure with Sedation  Procedure appointments are limited to planned procedures: . No Prescription Refills. . No disability issues will be discussed. . No medication changes will be discussed.  Instructions: . Oral Intake: Do not eat or drink anything for at least 8 hours prior to your procedure. (Exception: Blood Pressure Medication. See below.) . Transportation: Unless otherwise stated by your physician, you may drive yourself after the procedure. . Blood Pressure Medicine: Do not forget to take your blood pressure medicine with a sip of water the morning of the procedure. If your Diastolic (lower reading)is above 100 mmHg, elective cases will be cancelled/rescheduled. . Blood thinners: These will need to be stopped for procedures. Notify our staff if you are taking any blood thinners. Depending on which one you take, there will be specific instructions on how and when to stop it. . Diabetics on insulin: Notify the staff so that you can be scheduled 1st case in the morning. If your diabetes requires high dose insulin, take only  of your normal insulin dose the morning of the procedure and  notify the staff that you have done so. . Preventing infections: Shower with an antibacterial soap the morning of your procedure. . Build-up your immune system: Take 1000 mg of Vitamin C with every meal (3 times a day) the day prior to your procedure. . Antibiotics: Inform the staff if you have a condition or reason that requires you to take antibiotics before dental procedures. . Pregnancy: If you are pregnant, call and cancel the procedure. . Sickness: If you have a cold, fever, or any active infections, call and cancel the procedure. . Arrival: You must be in the facility at least 30 minutes prior to your scheduled procedure. . Children: Do not bring children with you. . Dress appropriately: Bring dark clothing that you would not mind if they get stained. . Valuables: Do not bring any jewelry or valuables.  Reasons to call and reschedule or cancel your procedure: (Following these recommendations will minimize the risk of a serious complication.) . Surgeries: Avoid having procedures within 2 weeks of any surgery. (Avoid for 2 weeks before or after any surgery). . Flu Shots: Avoid having procedures within 2 weeks of a flu shots or . (Avoid for 2 weeks before or after immunizations). . Barium: Avoid having a procedure within 7-10 days after having had a radiological study involving the use of radiological contrast. (Myelograms, Barium swallow or enema study). . Heart attacks: Avoid any elective procedures or surgeries for the initial 6 months after a "Myocardial Infarction" (Heart Attack). . Blood thinners: It is imperative that you stop these medications before procedures. Let   us know if you if you take any blood thinner.  . Infection: Avoid procedures during or within two weeks of an infection (including chest colds or gastrointestinal problems). Symptoms associated with infections include: Localized redness, fever, chills, night sweats or profuse sweating, burning sensation when voiding, cough,  congestion, stuffiness, runny nose, sore throat, diarrhea, nausea, vomiting, cold or Flu symptoms, recent or current infections. It is specially important if the infection is over the area that we intend to treat. . Heart and lung problems: Symptoms that may suggest an active cardiopulmonary problem include: cough, chest pain, breathing difficulties or shortness of breath, dizziness, ankle swelling, uncontrolled high or unusually low blood pressure, and/or palpitations. If you are experiencing any of these symptoms, cancel your procedure and contact your primary care physician for an evaluation.  Remember:  Regular Business hours are:  Monday to Thursday 8:00 AM to 4:00 PM  Provider's Schedule: Stepheni Cameron, MD:  Procedure days: Tuesday and Thursday 7:30 AM to 4:00 PM  Bilal Lateef, MD:  Procedure days: Monday and Wednesday 7:30 AM to 4:00 PM ____________________________________________________________________________________________    

## 2020-03-24 ENCOUNTER — Telehealth: Payer: Self-pay

## 2020-03-24 NOTE — Telephone Encounter (Signed)
Post proocedure phone call.  Patient states he is doing well.

## 2020-03-31 ENCOUNTER — Other Ambulatory Visit: Payer: Self-pay | Admitting: Family Medicine

## 2020-03-31 DIAGNOSIS — I10 Essential (primary) hypertension: Secondary | ICD-10-CM

## 2020-04-01 ENCOUNTER — Telehealth: Payer: Self-pay

## 2020-04-01 NOTE — Telephone Encounter (Signed)
Left detail message for verbal approval. 

## 2020-04-01 NOTE — Telephone Encounter (Signed)
Copied from [place] [id number]. Topic: Quick Communication - Home Health Verbal Orders >> Mar 31, 2020 10:57 AM Gillis Ends D wrote: Caller/Agency: [facility] Number: [phone] Requesting OT/PT/Skilled Nursing/Social Work/Speech Therapy: Frequency: Once a week for 6 weeks (continued care)

## 2020-04-05 ENCOUNTER — Encounter: Payer: Medicare Other | Admitting: Pain Medicine

## 2020-04-05 ENCOUNTER — Telehealth: Payer: Self-pay | Admitting: Pharmacist

## 2020-04-05 NOTE — Progress Notes (Signed)
PROVIDER NOTE: Information contained herein reflects review and annotations entered in association with encounter. Interpretation of such information and data should be left to medically-trained personnel. Information provided to patient can be located elsewhere in the medical record under "Patient Instructions". Document created using STT-dictation technology, any transcriptional errors that may result from process are unintentional.    Patient: Aaron Gibbs  Service Category: Procedure  Provider: Gaspar Cola, MD  DOB: 01-11-1935  DOS: 04/06/2020  Location: [facility]  MRN: [id number]  Setting: Ambulatory - outpatient  Referring Provider: [name] *  Type: Established Patient  Specialty: Interventional Pain Management  PCP: Olin Hauser, DO   Primary Reason for Visit: Interventional Pain Management Treatment. CC: Knee Pain (right )  Procedure:          Anesthesia, Analgesia, Anxiolysis:  Type: Therapeutic Superolateral, Superomedial, and Inferomedial, Genicular Nerve Radiofrequency Ablation (destruction).   #2  Region: Lateral, Anterior, and Medial aspects of the knee joint, above and below the knee joint proper. Level: Superior and inferior to the knee joint. Laterality: Right  Type: Moderate (Conscious) Sedation combined with Local Anesthesia Indication(s): Analgesia and Anxiety Route: Intravenous (IV) IV Access: Secured Sedation: Meaningful verbal contact was maintained at all times during the procedure  Local Anesthetic: Lidocaine 1-2%  Position: Supine   Indications: 1. Chronic knee pain (Primary Area of Pain) (Bilateral) (L>R)   2. Osteoarthritis of knee (Bilateral)   3. Osteoarthritis of patellofemoral joint (Right)   4. Patellar tendinosis (Right)    Aaron Gibbs has been dealing with the above chronic pain for longer than three months and has either failed to respond, was unable to tolerate, or simply did not get enough  benefit from other more conservative therapies including, but not limited to: 1. Over-the-counter medications 2. Anti-inflammatory medications 3. Muscle relaxants 4. Membrane stabilizers 5. Opioids 6. Physical therapy and/or chiropractic manipulation 7. Modalities (Heat, ice, etc.) 8. Invasive techniques such as nerve blocks. Aaron Gibbs has attained more than 50% relief of the pain from a series of diagnostic injections conducted in separate occasions.  Pain Score: Pre-procedure: 6 /10 Post-procedure: 0-No pain/10  Post-Procedure Evaluation  Procedure (03/23/2020): Therapeutic left genicular nerves RFA #2 under fluoroscopic guidance and IV sedation Pre-procedure pain level: 6/10 Post-procedure: 0/10 (100% relief)  Sedation: Sedation provided.  Effectiveness during initial hour after procedure(Ultra-Short Term Relief): 100 %.  Local anesthetic used: Long-acting (4-6 hours) Effectiveness: Defined as any analgesic benefit obtained secondary to the administration of local anesthetics. This carries significant diagnostic value as to the etiological location, or anatomical origin, of the pain. Duration of benefit is expected to coincide with the duration of the local anesthetic used.  Effectiveness during initial 4-6 hours after procedure(Short-Term Relief): 100 %.  Long-term benefit: Defined as any relief past the pharmacologic duration of the local anesthetics.  Effectiveness past the initial 6 hours after procedure(Long-Term Relief): 60 % (only 2 weeks out on RFA).  Current benefits: Defined as benefit that persist at this time.   Analgesia:  >50% relief Function: Somewhat improved ROM: Somewhat improved  Pre-op Assessment:  Aaron Gibbs is a 85 y.o. (year old), male patient, seen today for interventional treatment. He  has a past surgical history that includes Cholecystectomy and Transurethral resection of prostate (N/A, 2008). Aaron Gibbs has a current medication list which  includes the following prescription(s): albuterol, amlodipine, buprenorphine, [START ON 04/21/2020] buprenorphine, diclofenac sodium, fenofibrate, ferrous sulfate, hydroxyzine, lorazepam, multivitamin with minerals, mupirocin cream, pantoprazole, prednisolone acetate, quetiapine, risperidone, timolol,  tramadol, trazodone, umeclidinium-vilanterol, aspirin ec, buprenorphine, and duloxetine, and the following Facility-Administered Medications: midazolam. His primarily concern today is the Knee Pain (right )  Initial Vital Signs:  Pulse/HCG Rate: (!) 59ECG Heart Rate: (!) 56 Temp: (!) 97 F (36.1 C) Resp: 16 BP: 126/65 SpO2: 97 %  BMI: Estimated body mass index is 29.41 kg/m as calculated from the following:   Height as of this encounter: 5\' 10"  (1.778 m).   Weight as of this encounter: 205 lb (93 kg).  Risk Assessment: Allergies: Reviewed. He is allergic to azithromycin.  Allergy Precautions: None required Coagulopathies: Reviewed. None identified.  Blood-thinner therapy: None at this time Active Infection(s): Reviewed. None identified. Aaron Gibbs is afebrile  Site Confirmation: Aaron Gibbs was asked to confirm the procedure and laterality before marking the site Procedure checklist: Completed Consent: Before the procedure and under the influence of no sedative(s), amnesic(s), or anxiolytics, the patient was informed of the treatment options, risks and possible complications. To fulfill our ethical and legal obligations, as recommended by the American Medical Association's Code of Ethics, I have informed the patient of my clinical impression; the nature and purpose of the treatment or procedure; the risks, benefits, and possible complications of the intervention; the alternatives, including doing nothing; the risk(s) and benefit(s) of the alternative treatment(s) or procedure(s); and the risk(s) and benefit(s) of doing nothing. The patient was provided information about the general risks and  possible complications associated with the procedure. These may include, but are not limited to: failure to achieve desired goals, infection, bleeding, organ or nerve damage, allergic reactions, paralysis, and death. In addition, the patient was informed of those risks and complications associated to the procedure, such as failure to decrease pain; infection; bleeding; organ or nerve damage with subsequent damage to sensory, motor, and/or autonomic systems, resulting in permanent pain, numbness, and/or weakness of one or several areas of the body; allergic reactions; (i.e.: anaphylactic reaction); and/or death. Furthermore, the patient was informed of those risks and complications associated with the medications. These include, but are not limited to: allergic reactions (i.e.: anaphylactic or anaphylactoid reaction(s)); adrenal axis suppression; blood sugar elevation that in diabetics may result in ketoacidosis or comma; water retention that in patients with history of congestive heart failure may result in shortness of breath, pulmonary edema, and decompensation with resultant heart failure; weight gain; swelling or edema; medication-induced neural toxicity; particulate matter embolism and blood vessel occlusion with resultant organ, and/or nervous system infarction; and/or aseptic necrosis of one or more joints. Finally, the patient was informed that Medicine is not an exact science; therefore, there is also the possibility of unforeseen or unpredictable risks and/or possible complications that may result in a catastrophic outcome. The patient indicated having understood very clearly. We have given the patient no guarantees and we have made no promises. Enough time was given to the patient to ask questions, all of which were answered to the patient's satisfaction. Mr. Gann has indicated that he wanted to continue with the procedure. Attestation: I, the ordering provider, attest that I have discussed with  the patient the benefits, risks, side-effects, alternatives, likelihood of achieving goals, and potential problems during recovery for the procedure that I have provided informed consent. Date  Time: 04/06/2020  9:53 AM  Pre-Procedure Preparation:  Monitoring: As per clinic protocol. Respiration, ETCO2, SpO2, BP, heart rate and rhythm monitor placed and checked for adequate function Safety Precautions: Patient was assessed for positional comfort and pressure points before starting the procedure. Time-out: I  initiated and conducted the "Time-out" before starting the procedure, as per protocol. The patient was asked to participate by confirming the accuracy of the "Time Out" information. Verification of the correct person, site, and procedure were performed and confirmed by me, the nursing staff, and the patient. "Time-out" conducted as per Joint Commission's Universal Protocol (UP.01.01.01). Time: 1050  Description of Procedure:          Target Area: For Genicular Nerve radiofrequency ablation (destruction), the targets are: the superolateral genicular nerve, located in the lateral distal portion of the femoral shaft as it curves to form the lateral epicondyle, in the region of the distal femoral metaphysis; the superomedial genicular nerve, located in the medial distal portion of the femoral shaft as it curves to form the medial epicondyle; and the inferomedial genicular nerve, located in the medial, proximal portion of the tibial shaft, as it curves to form the medial epicondyle, in the region of the proximal tibial metaphysis. Approach: Anterior, ipsilateral approach. Area Prepped: Entire knee area, from mid-thigh to mid-shin, lateral, anterior, and medial aspects. DuraPrep (Iodine Povacrylex [0.7% available iodine] and Isopropyl Alcohol, 74% w/w) Safety Precautions: Aspiration looking for blood return was conducted prior to all injections. At no point did we inject any substances, as a needle was  being advanced. No attempts were made at seeking any paresthesias. Safe injection practices and needle disposal techniques used. Medications properly checked for expiration dates. SDV (single dose vial) medications used. Description of the Procedure: Protocol guidelines were followed. The patient was placed in position over the procedure table. The target area was identified and the area prepped in the usual manner. The skin and muscle were infiltrated with local anesthetic. Appropriate amount of time allowed to pass for local anesthetics to take effect. Radiofrequency needles were introduced to the target area using fluoroscopic guidance. Using the NeuroTherm NT1100 Radiofrequency Generator, sensory stimulation using 50 Hz was used to locate & identify the nerve, making sure that the needle was positioned such that there was no sensory stimulation below 0.3 V or above 0.7 V. Stimulation using 2 Hz was used to evaluate the motor component. Care was taken not to lesion any nerves that demonstrated motor stimulation of the lower extremities at an output of less than 2.5 times that of the sensory threshold, or a maximum of 2.0 V. Once satisfactory placement of the needles was achieved, the numbing solution was slowly injected after negative aspiration. After waiting for at least 2 minutes, the ablation was performed at 80 degrees C for 60 seconds, using regular Radiofrequency settings. Once the procedure was completed, the needles were then removed and the area cleansed, making sure to leave some of the prepping solution back to take advantage of its long term bactericidal properties. Intra-operative Compliance: Compliant Vitals:   04/06/20 1110 04/06/20 1115 04/06/20 1120 04/06/20 1130  BP: 132/71 115/66 131/71 130/70  Pulse: (!) 55 (!) 54    Resp: 16 13 20 20   Temp:      TempSrc:      SpO2: 93% 94% 95% 97%  Weight:      Height:        Start Time: 1050 hrs. End Time: 1114 hrs. Materials & Medications:   Needle(s) Type: Teflon-coated, curved tip, Radiofrequency needle(s) Gauge: 22G Length: 10cm Medication(s): Please see orders for medications and dosing details.  Imaging Guidance (Non-Spinal):          Type of Imaging Technique: Fluoroscopy Guidance (Non-Spinal) Indication(s): Assistance in needle guidance and placement for  procedures requiring needle placement in or near specific anatomical locations not easily accessible without such assistance. Exposure Time: Please see nurses notes. Contrast: Before injecting any contrast, we confirmed that the patient did not have an allergy to iodine, shellfish, or radiological contrast. Once satisfactory needle placement was completed at the desired level, radiological contrast was injected. Contrast injected under live fluoroscopy. No contrast complications. See chart for type and volume of contrast used. Fluoroscopic Guidance: I was personally present during the use of fluoroscopy. "Tunnel Vision Technique" used to obtain the best possible view of the target area. Parallax error corrected before commencing the procedure. "Direction-depth-direction" technique used to introduce the needle under continuous pulsed fluoroscopy. Once target was reached, antero-posterior, oblique, and lateral fluoroscopic projection used confirm needle placement in all planes. Images permanently stored in EMR. Interpretation: I personally interpreted the imaging intraoperatively. Adequate needle placement confirmed in multiple planes. Appropriate spread of contrast into desired area was observed. No evidence of afferent or efferent intravascular uptake. Permanent images saved into the patient's record.  Antibiotic Prophylaxis:   Anti-infectives (From admission, onward)   None     Indication(s): None identified  Post-operative Assessment:  Post-procedure Vital Signs:  Pulse/HCG Rate: (!) 54 (sb)(!) 56 Temp: (!) 97 F (36.1 C) Resp: 20 BP: 130/70 SpO2: 97 %  EBL:  None  Complications: No immediate post-treatment complications observed by team, or reported by patient.  Note: The patient tolerated the entire procedure well. A repeat set of vitals were taken after the procedure and the patient was kept under observation following institutional policy, for this type of procedure. Post-procedural neurological assessment was performed, showing return to baseline, prior to discharge. The patient was provided with post-procedure discharge instructions, including a section on how to identify potential problems. Should any problems arise concerning this procedure, the patient was given instructions to immediately contact us, at any time, without hesitation. In any case, we plan to contact the patient by telephone for a follow-up status report regarding this interventional procedure.  Comments:  No additional relevant information.  Plan of Care  Orders:  Orders Placed This Encounter  Procedures  . Radiofrequency,Genicular    Scheduling Instructions:     Side(s): Right Knee     Level(s): Superior-Lateral, Superior-Medial, and Inferior-Medial Genicular Nerve(s)     Sedation: Patient's choice.     Timeframe: Today    Order Specific Question:   Where will this procedure be performed?    Answer:   [facility]  . DG [facility] C-ARM 1-60 MIN NO REPORT    Intraoperative interpretation by procedural physician at [place].    Standing Status:   Standing    Number of Occurrences:   1    Order Specific Question:   Reason for exam:    Answer:   Assistance in needle guidance and placement for procedures requiring needle placement in or near specific anatomical locations not easily accessible without such assistance.  . Informed Consent Details: Physician/Practitioner Attestation; Transcribe to consent form and obtain patient signature    Order Specific Question:   Physician/Practitioner attestation of informed consent for procedure/surgical case     Answer:   I, the physician/practitioner, attest that I have discussed with the patient the benefits, risks, side effects, alternatives, likelihood of achieving goals and potential problems during recovery for the procedure that I have provided informed consent.    Order Specific Question:   Procedure    Answer:   Right sided genicular nerve radiofrequency ablation under  fluoroscopic guidance and IV sedation    Order Specific Question:   Physician/Practitioner performing the procedure    Answer:   Sadey Yandell A. Dossie Arbour, MD    Order Specific Question:   Indication/Reason    Answer:   Chronic knee pain and osteoarthritis  . Provide equipment / supplies at bedside    "Radiofrequency Tray"; Large hemostat (x1); Small hemostat (x1); Towels (x8); 4x4 sterile sponge pack (x1) Needle type: Teflon-coated Radiofrequency Needle (Disposable  single use) Size: Regular Quantity: 3    Standing Status:   Standing    Number of Occurrences:   1    Order Specific Question:   Specify    Answer:   Radiofrequency Tray  . Nursing Instructions:    Please complete this patient's postprocedure evaluation.    Scheduling Instructions:     Please complete this patient's postprocedure evaluation.   Chronic Opioid Analgesic:  No opioid analgesics prescribed by our practice.  Buprenorphine 10 mcg/h patch q. 7 days.  High risk for SUD.  History of suicidal attempts and noncompliance with medication intake. ED visits thought to be due to medication binging. MME/day: 0 mg/day.   Medications ordered for procedure: Meds ordered this encounter  Medications  . lidocaine (XYLOCAINE) 2 % (with pres) injection 400 mg  . lactated ringers infusion 1,000 mL  . midazolam (VERSED) 5 MG/5ML injection 1-2 mg    Make sure Flumazenil is available in the pyxis when using this medication. If oversedation occurs, administer 0.2 mg IV over 15 sec. If after 45 sec no response, administer 0.2 mg again over 1 min; may repeat at 1 min intervals;  not to exceed 4 doses (1 mg)  . fentaNYL (SUBLIMAZE) injection 25-50 mcg    Make sure Narcan is available in the pyxis when using this medication. In the event of respiratory depression (RR< 8/min): Titrate NARCAN (naloxone) in increments of 0.1 to 0.2 mg IV at 2-3 minute intervals, until desired degree of reversal.  . methylPREDNISolone acetate (DEPO-MEDROL) injection 80 mg  . ropivacaine (PF) 2 mg/mL (0.2%) (NAROPIN) injection 9 mL   Medications administered: We administered lidocaine, lactated ringers, midazolam, fentaNYL, methylPREDNISolone acetate, and ropivacaine (PF) 2 mg/mL (0.2%).  See the medical record for exact dosing, route, and time of administration.  Follow-up plan:   Return in about 6 weeks (around 05/18/2020) for (VV), (PP) Follow-up.       Interventional management options: Considering: Palliative treatments seen below.   PRN Procedures: Palliative bilateral IA Hyalgan knee injections S3/N1  Palliative bilateral IA knee injections (w/ steroids) #1  Diagnostic bilateral genicular NB #2  Palliative bilateral genicular nerve RFA #2 (last done 06/10/2019)      Recent Visits Date Type Provider Dept  03/23/20 Procedure visit Milinda Pointer, MD [facility]  03/09/20 Office Visit Gillis Santa, MD [facility]  02/02/20 Office Visit Milinda Pointer, MD [facility]  Showing recent visits within past 90 days and meeting all other requirements Today's Visits Date Type Provider Dept  04/06/20 Procedure visit Milinda Pointer, MD [facility]  Showing today's visits and meeting all other requirements Future Appointments Date Type Provider Dept  05/05/20 Appointment Milinda Pointer, MD [facility]  05/19/20 Appointment Milinda Pointer, MD [facility]  Showing future appointments within next 90 days and meeting all other requirements  Disposition: Discharge home  Discharge (Date  Time):  04/06/2020; 1200 hrs.   Primary Care Physician: Olin Hauser, DO Location: [facility] Note by:  Gaspar Cola, MD Date: 04/06/2020; Time: 11:55 AM  Disclaimer:  Medicine is not an Chief Strategy Officer. The only guarantee in medicine is that nothing is guaranteed. It is important to note that the decision to proceed with this intervention was based on the information collected from the patient. The Data and conclusions were drawn from the patient's questionnaire, the interview, and the physical examination. Because the information was provided in large part by the patient, it cannot be guaranteed that it has not been purposely or unconsciously manipulated. Every effort has been made to obtain as much relevant data as possible for this evaluation. It is important to note that the conclusions that lead to this procedure are derived in large part from the available data. Always take into account that the treatment will also be dependent on availability of resources and existing treatment guidelines, considered by other Pain Management Practitioners as being common knowledge and practice, at the time of the intervention. For Medico-Legal purposes, it is also important to point out that variation in procedural techniques and pharmacological choices are the acceptable norm. The indications, contraindications, technique, and results of the above procedure should only be interpreted and judged by a Board-Certified Interventional Pain Specialist with extensive familiarity and expertise in the same exact procedure and technique.

## 2020-04-05 NOTE — Patient Instructions (Signed)

## 2020-04-05 NOTE — Chronic Care Management (AMB) (Signed)
°  Chronic Care Management   Outreach Note  04/05/2020 Name: Aaron Gibbs MRN: [id number] DOB: 08-Jun-1935  Referred by: Olin Hauser, DO Reason for referral : No chief complaint on file.   Was unable to reach patient via telephone today and have left HIPAA compliant voicemail asking patient to return my call.    Follow Up Plan: Will collaborate with Care Guide to outreach to schedule follow up with me  [name], PharmD, [place] [facility] [phone]

## 2020-04-06 ENCOUNTER — Ambulatory Visit
Admission: RE | Admit: 2020-04-06 | Discharge: 2020-04-06 | Disposition: A | Discharge status: Home / Self Care | Payer: Medicare Other | Source: Ambulatory Visit | Attending: Pain Medicine | Admitting: Pain Medicine

## 2020-04-06 ENCOUNTER — Other Ambulatory Visit: Payer: Self-pay

## 2020-04-06 ENCOUNTER — Ambulatory Visit (HOSPITAL_BASED_OUTPATIENT_CLINIC_OR_DEPARTMENT_OTHER): Payer: Medicare Other | Admitting: Pain Medicine

## 2020-04-06 ENCOUNTER — Encounter: Payer: Self-pay | Admitting: Pain Medicine

## 2020-04-06 VITALS — BP 130/70 | HR 54 | Temp 97.0°F | Resp 20 | Ht 70.0 in | Wt 205.0 lb

## 2020-04-06 DIAGNOSIS — M25562 Pain in left knee: Secondary | ICD-10-CM | POA: Diagnosis not present

## 2020-04-06 DIAGNOSIS — M170 Bilateral primary osteoarthritis of knee: Secondary | ICD-10-CM | POA: Insufficient documentation

## 2020-04-06 DIAGNOSIS — M25561 Pain in right knee: Secondary | ICD-10-CM | POA: Insufficient documentation

## 2020-04-06 DIAGNOSIS — M1711 Unilateral primary osteoarthritis, right knee: Secondary | ICD-10-CM | POA: Insufficient documentation

## 2020-04-06 DIAGNOSIS — G8929 Other chronic pain: Secondary | ICD-10-CM

## 2020-04-06 DIAGNOSIS — M7650 Patellar tendinitis, unspecified knee: Secondary | ICD-10-CM

## 2020-04-06 MED ORDER — ROPIVACAINE HCL 2 MG/ML IJ SOLN
9.0000 mL | Freq: Once | INTRAMUSCULAR | Status: AC
  Administered 2020-04-06: 9 mL via PERINEURAL
  Filled 2020-04-06: qty 10

## 2020-04-06 MED ORDER — LACTATED RINGERS IV SOLN
1000.0000 mL | Freq: Once | INTRAVENOUS | Status: AC
  Administered 2020-04-06: 1000 mL via INTRAVENOUS

## 2020-04-06 MED ORDER — FENTANYL CITRATE (PF) 100 MCG/2ML IJ SOLN
25.0000 ug | INTRAMUSCULAR | Status: AC | PRN
  Administered 2020-04-06 (×2): 50 ug via INTRAVENOUS
  Filled 2020-04-06: qty 2

## 2020-04-06 MED ORDER — LIDOCAINE HCL 2 % IJ SOLN
20.0000 mL | Freq: Once | INTRAMUSCULAR | Status: AC
  Administered 2020-04-06: 400 mg

## 2020-04-06 MED ORDER — MIDAZOLAM HCL 5 MG/5ML IJ SOLN
1.0000 mg | INTRAMUSCULAR | Status: DC | PRN
  Administered 2020-04-06 (×2): 1 mg via INTRAVENOUS
  Filled 2020-04-06: qty 5

## 2020-04-06 MED ORDER — METHYLPREDNISOLONE ACETATE 80 MG/ML IJ SUSP
80.0000 mg | Freq: Once | INTRAMUSCULAR | Status: AC
  Administered 2020-04-06: 80 mg
  Filled 2020-04-06: qty 1

## 2020-04-07 ENCOUNTER — Other Ambulatory Visit: Payer: Self-pay | Admitting: Family Medicine

## 2020-04-07 ENCOUNTER — Telehealth: Payer: Self-pay | Admitting: *Deleted

## 2020-04-07 ENCOUNTER — Telehealth: Payer: Self-pay

## 2020-04-07 DIAGNOSIS — F410 Panic disorder [episodic paroxysmal anxiety] without agoraphobia: Secondary | ICD-10-CM

## 2020-04-07 NOTE — Chronic Care Management (AMB) (Signed)
  Care Management   Note  04/07/2020 Name: Aaron Gibbs MRN: [id number] DOB: 1935/01/26  Aaron Gibbs is a 85 y.o. year old male who is a primary care patient of Olin Hauser, DO and is actively engaged with the care management team. I reached out to [name] by phone today to assist with re-scheduling a follow up visit with the Pharmacist  Follow up plan: Unsuccessful telephone outreach attempt made. A HIPAA compliant phone message was left for the patient providing contact information and requesting a return call.  The care management team will reach out to the patient again over the next 7 days.  If patient returns call to provider office, please advise to call [facility]  at [place], [place], [place], [place] [address] Direct Dial: [phone] [email or web address] Website: [email or web address]

## 2020-04-07 NOTE — Telephone Encounter (Signed)
No problems post procedure. 

## 2020-04-08 ENCOUNTER — Telehealth: Payer: Self-pay | Admitting: *Deleted

## 2020-04-08 NOTE — Telephone Encounter (Signed)
Called patient, no answer left message that he has a med refill on 11-17 and that if he needed a refill that he would need to call and make an earlier appt.

## 2020-04-15 ENCOUNTER — Other Ambulatory Visit: Payer: Self-pay | Admitting: Family Medicine

## 2020-04-15 ENCOUNTER — Ambulatory Visit: Payer: Medicare Other | Attending: Pain Medicine | Admitting: Pain Medicine

## 2020-04-15 ENCOUNTER — Ambulatory Visit: Payer: Medicare Other

## 2020-04-15 ENCOUNTER — Encounter: Payer: Self-pay | Admitting: Pain Medicine

## 2020-04-15 ENCOUNTER — Other Ambulatory Visit: Payer: Self-pay

## 2020-04-15 VITALS — BP 110/60 | HR 62 | Temp 98.0°F | Resp 18 | Ht 70.0 in | Wt 205.0 lb

## 2020-04-15 DIAGNOSIS — M545 Low back pain, unspecified: Secondary | ICD-10-CM | POA: Diagnosis present

## 2020-04-15 DIAGNOSIS — G894 Chronic pain syndrome: Secondary | ICD-10-CM | POA: Insufficient documentation

## 2020-04-15 DIAGNOSIS — M4810 Ankylosing hyperostosis [Forestier], site unspecified: Secondary | ICD-10-CM | POA: Diagnosis present

## 2020-04-15 DIAGNOSIS — F411 Generalized anxiety disorder: Secondary | ICD-10-CM

## 2020-04-15 DIAGNOSIS — G8929 Other chronic pain: Secondary | ICD-10-CM | POA: Insufficient documentation

## 2020-04-15 DIAGNOSIS — Z87898 Personal history of other specified conditions: Secondary | ICD-10-CM | POA: Diagnosis present

## 2020-04-15 DIAGNOSIS — M792 Neuralgia and neuritis, unspecified: Secondary | ICD-10-CM | POA: Diagnosis present

## 2020-04-15 DIAGNOSIS — M25561 Pain in right knee: Secondary | ICD-10-CM | POA: Insufficient documentation

## 2020-04-15 DIAGNOSIS — Z9114 Patient's other noncompliance with medication regimen: Secondary | ICD-10-CM | POA: Insufficient documentation

## 2020-04-15 DIAGNOSIS — M25562 Pain in left knee: Secondary | ICD-10-CM | POA: Diagnosis present

## 2020-04-15 DIAGNOSIS — F410 Panic disorder [episodic paroxysmal anxiety] without agoraphobia: Secondary | ICD-10-CM

## 2020-04-15 MED ORDER — BUPRENORPHINE 10 MCG/HR TD PTWK: 1.0000 | MEDICATED_PATCH | TRANSDERMAL | 0 refills | Status: DC

## 2020-04-15 NOTE — Progress Notes (Signed)
Nursing Pain Medication Assessment:  Safety precautions to be maintained throughout the outpatient stay will include: orient to surroundings, keep bed in low position, maintain call bell within reach at all times, provide assistance with transfer out of bed and ambulation.  Medication Inspection Compliance: Aaron Gibbs did not comply with our request to bring his pills to be counted. He was reminded that bringing the medication bottles, even when empty, is a requirement.  Medication: None brought in. Pill/Patch Count: None available to be counted. Bottle Appearance: No container available. Did not bring bottle(s) to appointment. Filled Date: N/A Last Medication intake:  Day before yesterday  Did not bring patches or pills.  Reminded to bring to all appointments.

## 2020-04-15 NOTE — Chronic Care Management (AMB) (Signed)
  Care Management   Follow Up Note   04/15/2020 Name: Aaron Gibbs MRN: [id number] DOB: 03/31/1935  Referred by: Olin Hauser, DO Reason for referral : Care Coordination   Aaron Gibbs is a 85 y.o. year old male who is a primary care patient of Olin Hauser, DO. The care management team was consulted for assistance with care management and care coordination needs.    Review of patient status, including review of consultants reports, relevant laboratory and other test results, and collaboration with appropriate care team members and the patient's provider was performed as part of comprehensive patient evaluation and provision of chronic care management services.    LCSW completed CCM outreach attempt today but was unable to reach patient successfully. A HIPPA compliant voice message was left encouraging patient to return call once available if still interested in [employer]. LCSW has completed 3 unsuccessful outreach attempts and will close referral at this time.  We have been unable to make contact with the patient for follow up. The care management team is available to follow up with the patient after provider conversation with the patient regarding recommendation for care management engagement and subsequent re-referral to the care management team.   Eula Fried, BSW, [email or web address], [email or web address] Phone: [phone]

## 2020-04-15 NOTE — Progress Notes (Signed)
PROVIDER NOTE: Information contained herein reflects review and annotations entered in association with encounter. Interpretation of such information and data should be left to medically-trained personnel. Information provided to patient can be located elsewhere in the medical record under "Patient Instructions". Document created using STT-dictation technology, any transcriptional errors that may result from process are unintentional.    Patient: Aaron Gibbs  Service Category: E/M  Provider: Gaspar Cola, MD  DOB: 08-27-1934  DOS: 04/15/2020  Specialty: Interventional Pain Management  MRN: [id number]  Setting: Ambulatory outpatient  PCP: Olin Hauser, DO  Type: Established Patient    Referring Provider: [name] *  Location: Office  Delivery: Face-to-face     HPI  [name], a 85 y.o. year old male, is here today because of his Chronic pain syndrome [G89.4]. [name] primary complain today is Knee Pain (bilateral) Last encounter: My last encounter with him was on 04/06/2020. Pertinent problems: [name] has Chronic pain syndrome; DISH (diffuse idiopathic skeletal hyperostosis); Osteoarthritis of multiple joints; Chronic knee pain (Primary Area of Pain) (Bilateral) (L>R); Chronic low back pain (Bilateral) w/o sciatica; Somatic symptom disorder; Tricompartment osteoarthritis of knee (Left); Osteoarthritis of knee (Bilateral); Osteoarthritis of patellofemoral joint (Right); Neurogenic pain; Lateral meniscal tear, sequela (Left); Medial meniscal tear, sequela (Left); Patellar tendinosis (Right); and Osteoarthritis of knee (Left) on their pertinent problem list. Pain Assessment: Severity of Chronic pain is reported as a 7 /10. Location: Knee Right, Left/denies. Onset: More than a month ago. Quality: Dull, Constant, Shooting. Timing: Constant. Modifying factor(s): medications. Vitals:  height is _0  (1.778 m) and weight is 205 lb (93 kg). His  temperature is 98 F (36.7 C). His blood pressure is 110/60 and his pulse is 62. His respiration is 18 and oxygen saturation is 99%.   Reason for encounter: both, medication management and post-procedure assessment.  The patient indicates doing well with the current medication regimen. No adverse reactions or side effects reported to the medications.  The patient indicates that he no longer takes the Lyrica because he was having upper and lower extremity jerking movements while he was on it.  He refers that he has not been taking this medication for several months now.  Today he indicates that his legs are doing much better after the radiofrequency ablation and that the pain patch is working.  However, apparently while I was out on vacation he called complaining of some discomfort and he was given a single prescription for tramadol, which he has already taken and now he wants me to continue prescribing that for him.  After reviewing his medical records and careful consideration the answer to that was "no".  He has a longstanding history of substance use disorder, as well as a prior history of suicidal attempts.  He also has a history of noncompliance with the medications where he has been known to binge on the pain medications, resulting in cognitive impairment and disorientation with subsequent ED admissions.  Today he was very insistent on this, as it is always the case with him.  He tries to use several techniques to get me to give him more medication or prescribe an additional pain medicines for him.  Today he gave me the... "You mean to say that you are going to let me suffer?", as well as the.... "So you are negative be doing anything to help me with my pain?".  Clearly this is manipulation at its best.  He has been aggressively treated to control his pain and  he has recently completed bilateral genicular nerve radiofrequency ablations.  This is the second time that we do those as the first 1 did provide  him with significant relief of the pain as well.  However, he will still attempt to push me to give him more medicine.  At age 85, and in lieu of his history, I do not believe that it would be safe for him to get more medicine or appropriate for me to prescribe it.  RTCB: 07/14/2020  Post-Procedure Evaluation  Procedure (04/06/2020): Therapeutic/palliative bilateral genicular nerve RFA #2 under fluoroscopic guidance and IV sedation (right side done on 04/06/2020 and left side done on 03/23/2020) Pre-procedure pain level: 6/10 Post-procedure: 0/10 (100% relief)  Sedation: Sedation provided.  Effectiveness during initial hour after procedure(Ultra-Short Term Relief): 100 %.  Local anesthetic used: Long-acting (4-6 hours) Effectiveness: Defined as any analgesic benefit obtained secondary to the administration of local anesthetics. This carries significant diagnostic value as to the etiological location, or anatomical origin, of the pain. Duration of benefit is expected to coincide with the duration of the local anesthetic used.  Effectiveness during initial 4-6 hours after procedure(Short-Term Relief): 100 %.  Long-term benefit: Defined as any relief past the pharmacologic duration of the local anesthetics.  Effectiveness past the initial 6 hours after procedure(Long-Term Relief): 50 % (it has only been 9 days since RF).  Current benefits: Defined as benefit that persist at this time.   Analgesia:  >50% relief, despite the fact today has only been 9 days since his last radiofrequency.  Usually we do not see the peak benefit until 6 weeks after the radiofrequency. Function: [name] reports improvement in function ROM: Mr. Kistler reports improvement in ROM  Pharmacotherapy Assessment   Analgesic: No opioid analgesics prescribed by our practice.  Buprenorphine 10 mcg/h patch q. 7 days.  High risk for SUD.  History of suicidal attempts and noncompliance with medication intake. ED visits  thought to be due to medication binging. MME/day: 0 mg/day.   Monitoring: [place] PMP: PDMP reviewed during this encounter.       Pharmacotherapy: No side-effects or adverse reactions reported. Compliance: No problems identified. Effectiveness: Clinically acceptable.  Dewayne Shorter, RN  04/15/2020  1:04 PM  Signed Nursing Pain Medication Assessment:  Safety precautions to be maintained throughout the outpatient stay will include: orient to surroundings, keep bed in low position, maintain call bell within reach at all times, provide assistance with transfer out of bed and ambulation.  Medication Inspection Compliance: Mr. Guia did not comply with our request to bring his pills to be counted. He was reminded that bringing the medication bottles, even when empty, is a requirement.  Medication: None brought in. Pill/Patch Count: None available to be counted. Bottle Appearance: No container available. Did not bring bottle(s) to appointment. Filled Date: N/A Last Medication intake:  Day before yesterday  Did not bring patches or pills.  Reminded to bring to all appointments.     UDS:  Summary  Date Value Ref Range Status  02/02/2020 Note  Final    Comment:    ==================================================================== [employer] Select 13 (MW) ==================================================================== Test                             Result       Flag       Units  Drug Present and Declared for Prescription Verification   Buprenorphine  14           EXPECTED   ng/mg creat   Norbuprenorphine               22           EXPECTED   ng/mg creat    Source of buprenorphine is a scheduled prescription medication.    Norbuprenorphine is an expected metabolite of buprenorphine.  Drug Present not Declared for Prescription Verification   Oxazepam                       21           UNEXPECTED ng/mg creat    Oxazepam may be administered as a scheduled prescription  medication;    it is also an expected metabolite of other benzodiazepine drugs,    including diazepam, chlordiazepoxide, prazepam, clorazepate,    halazepam, and temazepam.    Lorazepam                      730          UNEXPECTED ng/mg creat    Source of lorazepam is a scheduled prescription medication.  ==================================================================== Test                      Result    Flag   Units      Ref Range   Creatinine              105              mg/dL      >=20 ==================================================================== Declared Medications:  The flagging and interpretation on this report are based on the  following declared medications.  Unexpected results may arise from  inaccuracies in the declared medications.   **Note: The testing scope of this panel does not include small to  moderate amounts of these reported medications:   Buprenorphine Patch (BuTrans)   **Note: The testing scope of this panel does not include the  following reported medications:   Albuterol  Amlodipine  Aspirin  Diclofenac (Voltaren)  Duloxetine (Cymbalta)  Fenofibrate (TriCor)  Iron  Multivitamin  Pantoprazole (Protonix)  Risperidone  Timolol  Trazodone  Umeclidinium (Anoro)  Vilanterol (Anoro) ==================================================================== For clinical consultation, please call [phone]. ====================================================================      ROS  Constitutional: Denies any fever or chills Gastrointestinal: No reported hemesis, hematochezia, vomiting, or acute GI distress Musculoskeletal: Denies any acute onset joint swelling, redness, loss of ROM, or weakness Neurological: No reported episodes of acute onset apraxia, aphasia, dysarthria, agnosia, amnesia, paralysis, loss of coordination, or loss of consciousness  Medication Review  DULoxetine, LORazepam, QUEtiapine, albuterol, amLODipine, aspirin EC,  buprenorphine, diclofenac Sodium, diclofenac sodium, fenofibrate, ferrous sulfate, hydrOXYzine, multivitamin with minerals, mupirocin cream, pantoprazole, prednisoLONE acetate, risperiDONE, timolol, traMADol, traZODone, and umeclidinium-vilanterol  History Review  Allergy: Mr. Bartow is allergic to azithromycin. Drug: Mr. Gnau  reports no history of drug use. Alcohol:  reports previous alcohol use. Tobacco:  reports that he has quit smoking. He quit after 20.00 years of use. His smokeless tobacco use includes chew. Social: Mr. Delduca  reports that he has quit smoking. He quit after 20.00 years of use. His smokeless tobacco use includes chew. He reports previous alcohol use. He reports that he does not use drugs. Medical:  has a past medical history of Anxiety, Cancer ([place]), COPD (chronic obstructive pulmonary disease) ([place]), Glaucoma, and Osteoporosis. Surgical: Mr. Reierson  has  a past surgical history that includes Cholecystectomy and Transurethral resection of prostate (N/A, 2008). Family: family history includes Depression in his mother; Heart disease in his father.  Laboratory Chemistry Profile   Renal Lab Results  Component Value Date   BUN 25 (H) 02/09/2020   CREATININE 1.04 02/09/2020   LABCREA 631 12/13/2019   BCR 28 (H) 07/10/2018   GFRAA >60 02/09/2020   GFRNONAA >60 02/09/2020     Hepatic Lab Results  Component Value Date   AST 28 02/08/2020   ALT 29 02/08/2020   ALBUMIN 4.2 02/08/2020   ALKPHOS 48 02/08/2020   LIPASE 24 06/04/2018   AMMONIA 25 09/13/2019     Electrolytes Lab Results  Component Value Date   NA 136 02/09/2020   K 3.9 02/09/2020   CL 105 02/09/2020   CALCIUM 9.6 02/09/2020   MG 2.3 04/02/2019   PHOS 3.4 03/30/2019     Bone Lab Results  Component Value Date   VD25OH 26 08/15/2016   25OHVITD1 34 07/10/2018   25OHVITD2 <1.0 07/10/2018   25OHVITD3 34 07/10/2018     Inflammation (CRP: Acute Phase) (ESR: Chronic Phase) Lab Results   Component Value Date   CRP 5 07/10/2018   ESRSEDRATE 20 07/10/2018   LATICACIDVEN 1.1 12/13/2019       Note: Above Lab results reviewed.  Recent Imaging Review  DG PAIN CLINIC C-ARM 1-60 MIN NO REPORT Fluoro was used, but no Radiologist interpretation will be provided.  Please refer to "NOTES" tab for provider progress note. Note: Reviewed        Physical Exam  General appearance: Well nourished, well developed, and well hydrated. In no apparent acute distress Mental status: Alert, oriented x 3 (person, place, & time)       Respiratory: No evidence of acute respiratory distress Eyes: PERLA Vitals: BP 110/60   Pulse 62   Temp 98 F (36.7 C)   Resp 18   Ht _0  (1.778 m)   Wt 205 lb (93 kg)   SpO2 99%   BMI 29.41 kg/m  BMI: Estimated body mass index is 29.41 kg/m as calculated from the following:   Height as of this encounter: _1  (1.778 m).   Weight as of this encounter: 205 lb (93 kg). Ideal: Ideal body weight: 73 kg (160 lb 15 oz) Adjusted ideal body weight: 81 kg (178 lb 9 oz)  Assessment   Status Diagnosis  Controlled Controlled Controlled 1. Chronic pain syndrome   2. Chronic knee pain (Primary Area of Pain) (Bilateral) (L>R)   3. Chronic low back pain (Bilateral) w/o sciatica   4. DISH (diffuse idiopathic skeletal hyperostosis)   5. Neurogenic pain   6. History of substance use disorder   7. Noncompliance with medication treatment due to overuse of medication      Updated Problems: Problem  History of Substance Use Disorder   Do not delete this diagnosis.  This patient has a history of pain medication noncompliance with history of binging.  He has ended up in the hospital with confusion secondary to these episodes.     Plan of Care  Problem-specific:  No problem-specific Assessment & Plan notes found for this encounter.  Mr. Hyatt Capobianco has a current medication list which includes the following long-term medication(s): albuterol,  amlodipine, [START ON 04/21/2020] buprenorphine, duloxetine, fenofibrate, ferrous sulfate, pantoprazole, trazodone, [START ON 05/19/2020] buprenorphine, [START ON 06/16/2020] buprenorphine, and [START ON 07/14/2020] buprenorphine.  Pharmacotherapy (Medications Ordered): Meds ordered this encounter  Medications  .  buprenorphine (BUTRANS) 10 MCG/HR PTWK    Sig: Place 1 patch onto the skin once a week for 28 days. Apply only 1 patch at a time and alternate sites weekly.    Dispense:  4 patch    Refill:  0    Chronic Pain: STOP Act (Not applicable) Fill 1 day early if closed on refill date. Avoid benzodiazepines within 8 hours of opioids  . buprenorphine (BUTRANS) 10 MCG/HR PTWK    Sig: Place 1 patch onto the skin once a week for 28 days. Apply only 1 patch at a time and alternate sites weekly.    Dispense:  4 patch    Refill:  0    Chronic Pain: STOP Act (Not applicable) Fill 1 day early if closed on refill date. Avoid benzodiazepines within 8 hours of opioids  . buprenorphine (BUTRANS) 10 MCG/HR PTWK    Sig: Place 1 patch onto the skin once a week for 28 days. Apply only 1 patch at a time and alternate sites weekly.    Dispense:  4 patch    Refill:  0    Chronic Pain: STOP Act (Not applicable) Fill 1 day early if closed on refill date. Avoid benzodiazepines within 8 hours of opioids   Orders:  No orders of the defined types were placed in this encounter.  Follow-up plan:   Return in about 4 months (around 08/11/2020) for (F2F), (Med Mgmt).      Interventional management options: Considering: Palliative treatments seen below.   PRN Procedures: Palliative bilateral IA Hyalgan knee injections S3/N1  Palliative bilateral IA knee injections (w/ steroids) #1  Diagnostic bilateral genicular NB #2  Palliative bilateral genicular nerve RFA #2 (last done 06/10/2019)       Recent Visits Date Type Provider Dept  04/06/20 Procedure visit Milinda Pointer, MD [facility]  03/23/20  Procedure visit Milinda Pointer, MD [facility]  03/09/20 Office Visit Gillis Santa, MD [facility]  02/02/20 Office Visit Milinda Pointer, MD [facility]  Showing recent visits within past 90 days and meeting all other requirements Today's Visits Date Type Provider Dept  04/15/20 Office Visit Milinda Pointer, MD [facility]  Showing today's visits and meeting all other requirements Future Appointments No visits were found meeting these conditions. Showing future appointments within next 90 days and meeting all other requirements  I discussed the assessment and treatment plan with the patient. The patient was provided an opportunity to ask questions and all were answered. The patient agreed with the plan and demonstrated an understanding of the instructions.  Patient advised to call back or seek an in-person evaluation if the symptoms or condition worsens.  Duration of encounter: 30 minutes.  Note by: Gaspar Cola, MD Date: 04/15/2020; Time: 1:31 PM

## 2020-04-15 NOTE — Telephone Encounter (Signed)
Requested medication (s) are due for refill today - yes  Requested medication (s) are on the active medication list -yes  Future visit scheduled -no  Last refill: 03/05/20  Notes to clinic: Request for non delegated Rx  Requested Prescriptions  Pending Prescriptions Disp Refills   LORazepam (ATIVAN) 0.5 MG tablet [Pharmacy Med Name: LORAZEPAM 0.5 MG TAB] 90 tablet     Sig: TAKE 1 TABLET BY MOUTH EVERY 6 HOURS AS NEEDED ANXIETY      Not Delegated - Psychiatry:  Anxiolytics/Hypnotics Failed - 04/15/2020  4:08 PM      Failed - This refill cannot be delegated      Passed - Urine Drug Screen completed in last 360 days.      Passed - Valid encounter within last 6 months    Recent Outpatient Visits           1 month ago Chronic pain syndrome   [place], DO   1 month ago Recurrent UTI   [facility] [place], Devonne Doughty, DO   2 months ago Severe episode of recurrent major depressive disorder, with psychotic features [facility])   [facility], Devonne Doughty, DO   7 months ago Severe episode of recurrent major depressive disorder, without psychotic features ([place])   [facility], Devonne Doughty, DO   10 months ago Severe episode of recurrent major depressive disorder, without psychotic features ([place])   [place], DO                  Requested Prescriptions  Pending Prescriptions Disp Refills   LORazepam (ATIVAN) 0.5 MG tablet [Pharmacy Med Name: LORAZEPAM 0.5 MG TAB] 90 tablet     Sig: TAKE 1 TABLET BY MOUTH EVERY 6 HOURS AS NEEDED ANXIETY      Not Delegated - Psychiatry:  Anxiolytics/Hypnotics Failed - 04/15/2020  4:08 PM      Failed - This refill cannot be delegated      Passed - Urine Drug Screen completed in last 360 days.      Passed - Valid encounter within last 6 months    Recent Outpatient Visits           1 month  ago Chronic pain syndrome   [place], DO   1 month ago Recurrent UTI   [facility] [place], Devonne Doughty, DO   2 months ago Severe episode of recurrent major depressive disorder, with psychotic features [facility])   [place], DO   7 months ago Severe episode of recurrent major depressive disorder, without psychotic features [facility])   [place], DO   10 months ago Severe episode of recurrent major depressive disorder, without psychotic features [facility])   [place], Devonne Doughty, DO

## 2020-04-16 ENCOUNTER — Other Ambulatory Visit: Payer: Self-pay | Admitting: Family Medicine

## 2020-04-16 NOTE — Telephone Encounter (Signed)
Requested medication (s) are due for refill today: last refill 04/15/20  Requested medication (s) are on the active medication list: yes  Last refill:  unknown historical provider   Future visit scheduled: no  Notes to clinic:  historical provider      Requested Prescriptions  Pending Prescriptions Disp Refills   QUEtiapine (SEROQUEL) 100 MG tablet [Pharmacy Med Name: QUETIAPINE FUMARATE 100 MG TAB] 90 tablet     Sig: TAKE 1 TABLET BY MOUTH AT BEDTIME      Not Delegated - Psychiatry:  Antipsychotics - Second Generation (Atypical) - quetiapine Failed - 04/16/2020  1:02 PM      Failed - This refill cannot be delegated      Passed - ALT in normal range and within 180 days    ALT  Date Value Ref Range Status  02/08/2020 29 0 - 44 U/L Final          Passed - AST in normal range and within 180 days    AST  Date Value Ref Range Status  02/08/2020 28 15 - 41 U/L Final          Passed - Completed PHQ-2 or PHQ-9 in the last 360 days      Passed - Last BP in normal range    BP Readings from Last 1 Encounters:  04/15/20 110/60          Passed - Valid encounter within last 6 months    Recent Outpatient Visits           1 month ago Chronic pain syndrome   [place], DO   1 month ago Recurrent UTI   [place], DO   2 months ago Severe episode of recurrent major depressive disorder, with psychotic features [facility])   [place], DO   7 months ago Severe episode of recurrent major depressive disorder, without psychotic features [facility])   [facility], Devonne Doughty, DO   10 months ago Severe episode of recurrent major depressive disorder, without psychotic features [facility])   [place], Devonne Doughty, DO

## 2020-04-22 NOTE — Chronic Care Management (AMB) (Signed)
  Care Management   Note  04/22/2020 Name: Sanjeev Main MRN: [id number] DOB: [birth year]  Elise Gladden is a 85 y.o. year old male who is a primary care patient of Olin Hauser, DO and is actively engaged with the care management team. I reached out to [name] by phone today to assist with re-scheduling a follow up visit with the Pharmacist  Follow up plan: Telephone appointment with care management team member scheduled for:05/03/2020  Noreene Larsson, [place], [facility]  [place], [place] [address] Direct Dial: [phone] [email or web address]: [email or web address]

## 2020-04-22 NOTE — Telephone Encounter (Signed)
Pt has been r/s  

## 2020-05-03 ENCOUNTER — Telehealth: Payer: Medicare Other

## 2020-05-03 ENCOUNTER — Telehealth: Payer: Self-pay | Admitting: Pharmacist

## 2020-05-03 NOTE — Chronic Care Management (AMB) (Signed)
  Chronic Care Management   Outreach Note  05/03/2020 Name: Aaron Gibbs MRN: [id number] DOB: 1934-08-13  Referred by: Olin Hauser, DO Reason for referral : No chief complaint on file.   Was unable to reach patient via telephone today and have left HIPAA compliant voicemail asking patient to return my call. Outreach attempt #2.   Follow Up Plan: Will collaborate with Care Guide to outreach to schedule follow up with me  [name], PharmD, [place] [facility] [phone]

## 2020-05-05 ENCOUNTER — Encounter: Payer: Medicare Other | Admitting: Pain Medicine

## 2020-05-19 ENCOUNTER — Telehealth: Payer: Medicare Other | Admitting: Pain Medicine

## 2020-05-20 ENCOUNTER — Telehealth: Payer: Self-pay

## 2020-05-20 NOTE — Chronic Care Management (AMB) (Signed)
  Care Management   Note  05/20/2020 Name: Aaron Gibbs MRN: [id number] DOB: 1935/04/06  Aaron Gibbs is a 85 y.o. year old male who is a primary care patient of Olin Hauser, DO and is actively engaged with the care management team. I reached out to [name] by phone today to assist with re-scheduling a follow up visit with the Pharmacist  Follow up plan: Unsuccessful telephone outreach attempt made. A HIPAA compliant phone message was left for the patient providing contact information and requesting a return call.  The care management team will reach out to the patient again over the next 7 days.  If patient returns call to provider office, please advise to call [place]  at [place], [place], [place], [place] [address] Direct Dial: [phone] [email or web address]: [email or web address]

## 2020-05-25 ENCOUNTER — Other Ambulatory Visit: Payer: Self-pay | Admitting: Family Medicine

## 2020-05-25 DIAGNOSIS — E782 Mixed hyperlipidemia: Secondary | ICD-10-CM

## 2020-05-25 MED ORDER — PANTOPRAZOLE SODIUM 40 MG PO TBEC
40.0000 mg | DELAYED_RELEASE_TABLET | Freq: Every day | ORAL | 3 refills | Status: DC
Start: 2020-05-25 — End: 2022-05-01

## 2020-05-25 MED ORDER — FENOFIBRATE 145 MG PO TABS: 145.0000 mg | ORAL_TABLET | Freq: Every day | ORAL | 1 refills | Status: DC

## 2020-05-25 NOTE — Telephone Encounter (Signed)
Notes to clinic:  Medication was filled by a historical provider Review for refill   Requested Prescriptions  Pending Prescriptions Disp Refills   pantoprazole (PROTONIX) 40 MG tablet 90 tablet 3    Sig: Take 1 tablet (40 mg total) by mouth daily.      Gastroenterology: Proton Pump Inhibitors Passed - 05/25/2020 10:47 AM      Passed - Valid encounter within last 12 months    Recent Outpatient Visits           2 months ago Chronic pain syndrome   [place], DO   3 months ago Recurrent UTI   [facility] [place], Devonne Doughty, DO   3 months ago Severe episode of recurrent major depressive disorder, with psychotic features [name])   [facility], Devonne Doughty, DO   8 months ago Severe episode of recurrent major depressive disorder, without psychotic features [facility])   [facility], Devonne Doughty, DO   12 months ago Severe episode of recurrent major depressive disorder, without psychotic features [facility])   [facility] [place], Devonne Doughty, DO               Signed Prescriptions Disp Refills   fenofibrate (TRICOR) 145 MG tablet 90 tablet 1    Sig: Take 1 tablet (145 mg total) by mouth daily.      Cardiovascular:  Antilipid - Fibric Acid Derivatives Failed - 05/25/2020 10:47 AM      Failed - Total Cholesterol in normal range and within 360 days    No results found for: CHOL, POCCHOL, CHOLTOT        Failed - LDL in normal range and within 360 days    No results found for: LDLCALC, LDLC, HIRISKLDL, POCLDL, LDLDIRECT, REALLDLC, TOTLDLC        Failed - HDL in normal range and within 360 days    No results found for: HDL, POCHDL        Failed - Triglycerides in normal range and within 360 days    Triglycerides  Date Value Ref Range Status  03/30/2019 228 (H) <150 mg/dL Final    Comment:    Performed at [facility], [address]  [place], [place] [address]          Passed - ALT in normal range and within 180 days    ALT  Date Value Ref Range Status  02/08/2020 29 0 - 44 U/L Final          Passed - AST in normal range and within 180 days    AST  Date Value Ref Range Status  02/08/2020 28 15 - 41 U/L Final          Passed - Cr in normal range and within 180 days    Creatinine, Ser  Date Value Ref Range Status  02/09/2020 1.04 0.61 - 1.24 mg/dL Final   Creatinine, Urine  Date Value Ref Range Status  12/13/2019 631 mg/dL Final    Comment:    RESULTS CONFIRMED BY MANUAL DILUTION Performed at [facility], [address] [place], [place] [address]           Passed - eGFR in normal range and within 180 days    GFR calc Af Wyvonnia Lora  Date Value Ref Range Status  02/09/2020 >60 >60 mL/min Final   GFR calc non Af Wyvonnia Lora  Date Value Ref Range Status  02/09/2020 >60 >60 mL/min Final          Passed - Valid encounter within last 12 months    Recent Outpatient Visits           2 months ago Chronic pain syndrome   [place], DO   3 months ago Recurrent UTI   [facility] [place], Devonne Doughty, DO   3 months ago Severe episode of recurrent major depressive disorder, with psychotic features [facility])   [place], DO   8 months ago Severe episode of recurrent major depressive disorder, without psychotic features [facility])   [place], DO   12 months ago Severe episode of recurrent major depressive disorder, without psychotic features [facility])   [place], Devonne Doughty, DO

## 2020-05-25 NOTE — Telephone Encounter (Signed)
Medication Refill - Medication: fenofibrate 145mg  and pantoprazole 40mg  90 day supply  Has the patient contacted their pharmacy? Yes.   (Agent: If no, request that the patient contact the pharmacy for the refill.) (Agent: If yes, when and what did the pharmacy advise?)  Preferred Pharmacy (with phone number or street name): [facility] [place], [place].  Agent: Please be advised that RX refills may take up to 3 business days. We ask that you follow-up with your pharmacy.

## 2020-05-27 NOTE — Chronic Care Management (AMB) (Signed)
  Care Management   Note  05/27/2020 Name: Aaron Gibbs MRN: [id number] DOB: 01-19-1935  Aaron Gibbs is a 85 y.o. year old male who is a primary care patient of Olin Hauser, DO and is actively engaged with the care management team. I reached out to [name] by phone today to assist with re-scheduling a follow up visit with the Pharmacist  Follow up plan: Unsuccessful telephone outreach attempt made. A HIPAA compliant phone message was left for the patient providing contact information and requesting a return call.  The care management team will reach out to the patient again over the next 7 days.  If patient returns call to provider office, please advise to call [facility]  at [place], [place], [place], [place] [address] Direct Dial: [phone] [email or web address] Website: [email or web address]

## 2020-06-01 ENCOUNTER — Ambulatory Visit: Payer: Medicare Other

## 2020-06-01 NOTE — Chronic Care Management (AMB) (Signed)
  Care Management   Note  06/01/2020 Name: Christoper Bushey MRN: [id number] DOB: 04-Nov-1934  Aaron Gibbs is a 85 y.o. year old male who is a primary care patient of Olin Hauser, DO and is actively engaged with the care management team. I reached out to [name] by phone today to assist with re-scheduling a follow up visit with the Pharmacist  Follow up plan: Unable to make contact on outreach attempts x 3. PCP Dr. Parks Ranger notified via routed documentation in medical record.   Noreene Larsson, [name], [place], [place] [address] Direct Dial: [phone] [email or web address] Website: [email or web address]

## 2020-06-01 NOTE — Telephone Encounter (Signed)
Third unsuccessful outreach  

## 2020-06-14 ENCOUNTER — Other Ambulatory Visit: Payer: Self-pay | Admitting: Family Medicine

## 2020-06-14 DIAGNOSIS — F332 Major depressive disorder, recurrent severe without psychotic features: Secondary | ICD-10-CM

## 2020-06-14 DIAGNOSIS — I10 Essential (primary) hypertension: Secondary | ICD-10-CM

## 2020-06-14 DIAGNOSIS — F410 Panic disorder [episodic paroxysmal anxiety] without agoraphobia: Secondary | ICD-10-CM

## 2020-06-14 NOTE — Telephone Encounter (Signed)
Requested Prescriptions  Pending Prescriptions Disp Refills  . hydrOXYzine (ATARAX/VISTARIL) 25 MG tablet [Pharmacy Med Name: HYDROXYZINE HCL 25 MG TAB] 120 tablet 2    Sig: TAKE 1 TABLET BY MOUTH 4 TIMES DAILY     Ear, Nose, and Throat:  Antihistamines Passed - 06/14/2020 10:00 AM      Passed - Valid encounter within last 12 months    Recent Outpatient Visits          3 months ago Chronic pain syndrome   [place], DO   3 months ago Recurrent UTI   [facility] [place], Devonne Doughty, DO   4 months ago Severe episode of recurrent major depressive disorder, with psychotic features ([place])   [facility], Devonne Doughty, DO   9 months ago Severe episode of recurrent major depressive disorder, without psychotic features ([place])   [facility] O[facility] Hauser, DO   1 year ago Severe episode of recurrent major depressive disorder, without psychotic features ([place])   [facility] Devonne Doughty, DO             . amLODipine (NORVASC) 10 MG tablet [Pharmacy Med Name: AMLODIPINE BESYLATE 10 MG TAB] 90 tablet 0    Sig: TAKE 1 TABLET BY MOUTH ONCE DAILY     Cardiovascular:  Calcium Channel Blockers Passed - 06/14/2020 10:00 AM      Passed - Last BP in normal range    BP Readings from Last 1 Encounters:  04/15/20 110/60         Passed - Valid encounter within last 6 months    Recent Outpatient Visits          3 months ago Chronic pain syndrome   [place], DO   3 months ago Recurrent UTI   [place], DO   4 months ago Severe episode of recurrent major depressive disorder, with psychotic features [facility])   [facility], Devonne Doughty, DO   9 months ago Severe episode of recurrent major depressive disorder, without psychotic features [facility])   [place], DO   1 year ago Severe episode of recurrent major depressive disorder, without psychotic features [facility])   [facility] [place], Devonne Doughty, DO             . LORazepam (ATIVAN) 0.5 MG tablet [Pharmacy Med Name: LORAZEPAM 0.5 MG TAB] 90 tablet     Sig: TAKE 1 TABLET BY MOUTH EVERY 6 HOURS AS NEEDED ANXIETY     Not Delegated - Psychiatry:  Anxiolytics/Hypnotics Failed - 06/14/2020 10:00 AM      Failed - This refill cannot be delegated      Passed - Urine Drug Screen completed in last 360 days      Passed - Valid encounter within last 6 months    Recent Outpatient Visits          3 months ago Chronic pain syndrome   [place], DO   3 months ago Recurrent UTI   [place], DO   4 months ago Severe episode of recurrent major depressive disorder, with psychotic features [facility])   [place], DO   9 months ago Severe episode of recurrent major depressive disorder, without psychotic features ([place])  [facility] [place], Netta Neat, DO   1 year ago Severe episode of recurrent major depressive disorder, without psychotic features (HCC)   [facility], Netta Neat, DO             . risperiDONE (RISPERDAL) 0.5 MG tablet [Pharmacy Med Name: RISPERIDONE 0.5 MG TAB] 180 tablet     Sig: TAKE 1 TABLET BY MOUTH TWICE DAILY     Not Delegated - Psychiatry:  Antipsychotics - Second Generation (Atypical) - risperidone Failed - 06/14/2020 10:00 AM      Failed - This refill cannot be delegated      Failed - Prolactin Level (serum) in normal range and within 180 days    No results found for: PROLACTIN, TOTPROLACTIN, LABPROL       Passed - ALT in normal range and within 180 days    ALT  Date Value Ref Range Status  02/08/2020 29 0 - 44 U/L Final          Passed - AST in normal range and within 180 days    AST  Date Value Ref Range Status  02/08/2020 28 15 - 41 U/L Final         Passed - Valid encounter within last 6 months    Recent Outpatient Visits          3 months ago Chronic pain syndrome   [facility] [place], Netta Neat, DO   3 months ago Recurrent UTI   [facility], Netta Neat, DO   4 months ago Severe episode of recurrent major depressive disorder, with psychotic features [facility])   [facility], Netta Neat, DO   9 months ago Severe episode of recurrent major depressive disorder, without psychotic features [facility])   [facility] Smitty Cords, DO   1 year ago Severe episode of recurrent major depressive disorder, without psychotic features [facility])   [facility] [place], Netta Neat, DO

## 2020-06-14 NOTE — Telephone Encounter (Signed)
Requested medication (s) are due for refill today: lorazepam: no   Risperdal: -  Requested medication (s) are on the active medication list: lorazepam:   Risperdal: historical med  Last refill:  lorazepam: 04/15/20        Risperdal: 04/06/20  Future visit scheduled: no  Notes to clinic:  lorazepam: NT not delegated to refill or refuse this med Risperdal: historical med and provider   Requested Prescriptions  Pending Prescriptions Disp Refills   LORazepam (ATIVAN) 0.5 MG tablet [Pharmacy Med Name: LORAZEPAM 0.5 MG TAB] 90 tablet     Sig: TAKE 1 TABLET BY MOUTH EVERY 6 HOURS AS NEEDED ANXIETY      Not Delegated - Psychiatry:  Anxiolytics/Hypnotics Failed - 06/14/2020 10:00 AM      Failed - This refill cannot be delegated      Passed - Urine Drug Screen completed in last 360 days      Passed - Valid encounter within last 6 months    Recent Outpatient Visits           3 months ago Chronic pain syndrome   [facility] Smitty Cords, DO   3 months ago Recurrent UTI   [facility] [place], Netta Neat, DO   4 months ago Severe episode of recurrent major depressive disorder, with psychotic features (HCC)   [facility] Smitty Cords, DO   9 months ago Severe episode of recurrent major depressive disorder, without psychotic features (HCC)   [facility] Smitty Cords, DO   1 year ago Severe episode of recurrent major depressive disorder, without psychotic features (HCC)   [facility], Netta Neat, DO                  risperiDONE (RISPERDAL) 0.5 MG tablet [Pharmacy Med Name: RISPERIDONE 0.5 MG TAB] 180 tablet     Sig: TAKE 1 TABLET BY MOUTH TWICE DAILY      Not Delegated - Psychiatry:  Antipsychotics - Second Generation (Atypical) - risperidone Failed - 06/14/2020 10:00 AM      Failed - This refill cannot be delegated      Failed - Prolactin Level  (serum) in normal range and within 180 days    No results found for: PROLACTIN, TOTPROLACTIN, LABPROL        Passed - ALT in normal range and within 180 days    ALT  Date Value Ref Range Status  02/08/2020 29 0 - 44 U/L Final          Passed - AST in normal range and within 180 days    AST  Date Value Ref Range Status  02/08/2020 28 15 - 41 U/L Final          Passed - Valid encounter within last 6 months    Recent Outpatient Visits           3 months ago Chronic pain syndrome   [facility] [place], Netta Neat, DO   3 months ago Recurrent UTI   [facility] [place], Netta Neat, DO   4 months ago Severe episode of recurrent major depressive disorder, with psychotic features [facility])   [facility], Netta Neat, DO   9 months ago Severe episode of recurrent major depressive disorder, without psychotic features [facility])   [facility] [place], Netta Neat, DO   1 year ago Severe episode of recurrent major depressive  disorder, without psychotic features [facility])   [facility] [place], Netta Neat, DO                 Signed Prescriptions Disp Refills   hydrOXYzine (ATARAX/VISTARIL) 25 MG tablet 120 tablet 2    Sig: TAKE 1 TABLET BY MOUTH 4 TIMES DAILY      Ear, Nose, and Throat:  Antihistamines Passed - 06/14/2020 10:00 AM      Passed - Valid encounter within last 12 months    Recent Outpatient Visits           3 months ago Chronic pain syndrome   [name] [place], Netta Neat, DO   3 months ago Recurrent UTI   [facility] [place], Netta Neat, DO   4 months ago Severe episode of recurrent major depressive disorder, with psychotic features (HCC)   [facility], Netta Neat, DO   9 months ago Severe episode of recurrent major depressive disorder, without psychotic features (HCC)   [facility] Smitty Cords, DO   1 year ago Severe episode of recurrent major depressive disorder, without psychotic features (HCC)   [facility], Netta Neat, DO                  amLODipine (NORVASC) 10 MG tablet 90 tablet 0    Sig: TAKE 1 TABLET BY MOUTH ONCE DAILY      Cardiovascular:  Calcium Channel Blockers Passed - 06/14/2020 10:00 AM      Passed - Last BP in normal range    BP Readings from Last 1 Encounters:  04/15/20 110/60          Passed - Valid encounter within last 6 months    Recent Outpatient Visits           3 months ago Chronic pain syndrome   [facility] [place], Netta Neat, DO   3 months ago Recurrent UTI   [facility] [place], Netta Neat, DO   4 months ago Severe episode of recurrent major depressive disorder, with psychotic features [facility])   [facility], Netta Neat, DO   9 months ago Severe episode of recurrent major depressive disorder, without psychotic features [facility])   [facility] Smitty Cords, DO   1 year ago Severe episode of recurrent major depressive disorder, without psychotic features [facility])   [facility] [place], Netta Neat, DO

## 2020-07-09 ENCOUNTER — Telehealth: Payer: Self-pay | Admitting: Family Medicine

## 2020-07-09 DIAGNOSIS — F333 Major depressive disorder, recurrent, severe with psychotic symptoms: Secondary | ICD-10-CM

## 2020-07-09 MED ORDER — DULOXETINE HCL 20 MG PO CPEP: 80.0000 mg | ORAL_CAPSULE | Freq: Every day | ORAL | 0 refills | Status: DC

## 2020-07-09 NOTE — Telephone Encounter (Signed)
Patient states he suffers from depression and would like PCP to prescribe DULoxetine (CYMBALTA) 20 MG capsule 4x daily.   Patient states he reached out to the prescribing doctor and received no response. Patient states he is completely out and would like a follow up call regarding the status of request.   [facility], [place] - [address] Phone:  [phone]  Fax:  [phone]

## 2020-07-09 NOTE — Telephone Encounter (Signed)
Sent the requested re order rx Duloxetine 20mg  x 4 = 80mg  daily, for 1 month supply to [facility].  Please notify patient he should continue to get in touch with the psychiatry / previous prescriber and I may not be able to continue this for him long term.  [name], [facility] 07/09/2020, 2:55 PM

## 2020-07-12 ENCOUNTER — Other Ambulatory Visit: Payer: Self-pay

## 2020-07-12 ENCOUNTER — Ambulatory Visit (INDEPENDENT_AMBULATORY_CARE_PROVIDER_SITE_OTHER): Payer: Medicare Other | Admitting: Family Medicine

## 2020-07-12 ENCOUNTER — Encounter: Payer: Self-pay | Admitting: Family Medicine

## 2020-07-12 VITALS — BP 142/83 | HR 69

## 2020-07-12 DIAGNOSIS — N390 Urinary tract infection, site not specified: Secondary | ICD-10-CM | POA: Diagnosis not present

## 2020-07-12 DIAGNOSIS — R399 Unspecified symptoms and signs involving the genitourinary system: Secondary | ICD-10-CM | POA: Diagnosis not present

## 2020-07-12 DIAGNOSIS — R351 Nocturia: Secondary | ICD-10-CM

## 2020-07-12 DIAGNOSIS — J3089 Other allergic rhinitis: Secondary | ICD-10-CM | POA: Diagnosis not present

## 2020-07-12 LAB — POCT URINALYSIS DIPSTICK
Bilirubin, UA: NEGATIVE
Glucose, UA: NEGATIVE
Ketones, UA: NEGATIVE
Leukocytes, UA: NEGATIVE
Nitrite, UA: NEGATIVE
Odor: NEGATIVE
Protein, UA: POSITIVE — AB
Spec Grav, UA: 1.02 (ref 1.010–1.025)
Urobilinogen, UA: 0.2 E.U./dL
pH, UA: 6 (ref 5.0–8.0)

## 2020-07-12 MED ORDER — FLUTICASONE PROPIONATE 50 MCG/ACT NA SUSP: 2.0000 | Freq: Every day | NASAL | 3 refills | Status: DC

## 2020-07-12 MED ORDER — TAMSULOSIN HCL 0.4 MG PO CAPS
0.4000 mg | ORAL_CAPSULE | Freq: Every day | ORAL | 2 refills | Status: DC
Start: 2020-07-12 — End: 2020-11-23

## 2020-07-12 NOTE — Progress Notes (Signed)
Subjective:    Patient ID: Aaron Gibbs, male    DOB: 04-Dec-1934, 86 y.o.   MRN: [id number]  Aaron Gibbs is a 86 y.o. male presenting on 07/12/2020 for Urinary Tract Infection   HPI   UTI, history of recurrent Prior has UTI that can be more severe and cause sepsis and hospitalization Here today with recurrent 1 month symptoms of nocturia and urgency, often if he doesn't wake up with alarm at night can have urination in bed accidental, he has tried to avoid this now and has some reduced urinary flow at times, no prior dx BPH or trial on medication. Denies dysuria, fever chills nausea vomiting sick symptoms of UTI  Allergic Rhinosinusitis Improved on Flonase, needs order. Admits had congestion prior   Depression screen [facility] 2/9 04/15/2020 03/05/2020 02/02/2020  Decreased Interest 0 2 0  Down, Depressed, Hopeless 0 2 0  PHQ - 2 Score 0 4 0  Altered sleeping - 3 -  Tired, decreased energy - 2 -  Change in appetite - 2 -  Feeling bad or failure about yourself  - 1 -  Trouble concentrating - 2 -  Moving slowly or fidgety/restless - 2 -  Suicidal thoughts - 1 -  PHQ-9 Score - 17 -  Difficult doing work/chores - Very difficult -  Some recent data might be hidden    Social History   Tobacco Use  . Smoking status: Former Smoker    Years: 20.00  . Smokeless tobacco: Current User    Types: Chew  . Tobacco comment: stopped 15 years ago  Vaping Use  . Vaping Use: Never used  Substance Use Topics  . Alcohol use: Not Currently    Comment: past  . Drug use: Never    Review of Systems Per HPI unless specifically indicated above     Objective:    BP (!) 142/83 (BP Location: Left Arm, Patient Position: Sitting, Cuff Size: Normal)   Pulse 69   SpO2 98%   Wt Readings from Last 3 Encounters:  04/15/20 205 lb (93 kg)  04/06/20 205 lb (93 kg)  03/23/20 205 lb (93 kg)    Physical Exam Vitals and nursing note reviewed.  Constitutional:      General: He is not in acute  distress.    Appearance: He is well-developed and well-nourished. He is obese. He is not diaphoretic.     Comments: Well-appearing, comfortable, cooperative  HENT:     Head: Normocephalic and atraumatic.     Mouth/Throat:     Mouth: Oropharynx is clear and moist.  Eyes:     General:        Right eye: No discharge.        Left eye: No discharge.     Conjunctiva/sclera: Conjunctivae normal.  Cardiovascular:     Rate and Rhythm: Normal rate.  Pulmonary:     Effort: Pulmonary effort is normal.  Musculoskeletal:        General: No edema.     Comments: Using walker, able to stand from seated.  Skin:    General: Skin is warm and dry.     Findings: No erythema or rash.  Neurological:     Mental Status: He is alert and oriented to person, place, and time.  Psychiatric:        Mood and Affect: Mood and affect normal.        Behavior: Behavior normal.     Comments: Well groomed, good eye contact, normal speech and  thoughts       Results for orders placed or performed in visit on 07/12/20  POCT Urinalysis Dipstick  Result Value Ref Range   Color, UA Yellow    Clarity, UA Clear    Glucose, UA Negative Negative   Bilirubin, UA Negative    Ketones, UA Negative    Spec Grav, UA 1.020 1.010 - 1.025   Blood, UA Moderate    pH, UA 6.0 5.0 - 8.0   Protein, UA Positive (A) Negative   Urobilinogen, UA 0.2 0.2 or 1.0 E.U./dL   Nitrite, UA Negative    Leukocytes, UA Negative Negative   Appearance Clear    Odor Negative       Assessment & Plan:   Problem List Items Addressed This Visit   None   Visit Diagnoses    Recurrent UTI    -  Primary   Relevant Orders   POCT Urinalysis Dipstick (Completed)   Urine Culture   Nocturia       Relevant Medications   tamsulosin (FLOMAX) 0.4 MG CAPS capsule   Lower urinary tract symptoms (LUTS)       Relevant Medications   tamsulosin (FLOMAX) 0.4 MG CAPS capsule   Seasonal allergic rhinitis due to other allergic trigger       Relevant  Medications   fluticasone (FLONASE) 50 MCG/ACT nasal spray      Possible Acute UTI, history of recurrent UTI LUTS, without known BPH history  Urine dipstick showed blood and protein Symptoms for 1 month mixed LUTS Will send urine culture today - if identify bacterial UTI will treat accordingly Trial on Tamsulosin 0.4mg  daily in evening, caution sudden standing dizzy lightheaded orthostatic, may help improve his voiding symptoms, future may refer to Urology if unresolved.  Orders Placed This Encounter  Procedures  . Urine Culture  . POCT Urinalysis Dipstick     Meds ordered this encounter  Medications  . tamsulosin (FLOMAX) 0.4 MG CAPS capsule    Sig: Take 1 capsule (0.4 mg total) by mouth daily after supper.    Dispense:  30 capsule    Refill:  2  . fluticasone (FLONASE) 50 MCG/ACT nasal spray    Sig: Place 2 sprays into both nostrils daily. Use for 4-6 weeks then stop and use seasonally or as needed.    Dispense:  16 g    Refill:  3      Follow up plan: Return in about 5 months (around 12/10/2020), or if symptoms worsen or fail to improve, for 4-6 weeks med refills, BPH.   Future PSA with next blood panel Summer or Fall 2022  [name], [facility] 07/12/2020, 1:58 PM

## 2020-07-12 NOTE — Patient Instructions (Addendum)
Thank you for coming to the office today.  Start nasal steroid Flonase 2 sprays in each nostril daily for 4-6 weeks, may repeat course seasonally or as needed  Use the Tamsulosin 0.4mg  tab one with supper nightly, to help urination at night, can be due to enlarged prostate.  Sent urine for culture, if shows bacterial growth we can call in an antibiotic if needed, someone should call regardless with result.  May need Urologist in future if this does not resolved.  Future can check PSA lab with blood work in the Fall 2022  Please schedule a Follow-up Appointment to: Return in about 5 months (around 12/10/2020), or if symptoms worsen or fail to improve, for 4-6 weeks med refills, BPH.  If you have any other questions or concerns, please feel free to call the office or send a message through [place]. You may also schedule an earlier appointment if necessary.  Additionally, you may be receiving a survey about your experience at our office within a few days to 1 week by e-mail or mail. We value your feedback.  [name], DO [place]

## 2020-07-13 LAB — URINE CULTURE
MICRO NUMBER:: 11448966
Result:: NO GROWTH
SPECIMEN QUALITY:: ADEQUATE

## 2020-07-21 ENCOUNTER — Telehealth: Payer: Self-pay | Admitting: Family Medicine

## 2020-07-21 NOTE — Telephone Encounter (Signed)
Copied from [place] [phone]. Topic: Medicare AWV >> Jul 21, 2020 11:41 AM Cher Nakai R wrote: Reason for CRM:  Left message for patient to call back and schedule the Medicare Annual Wellness Visit (AWV) virtually or by telephone.  Last AWV  05/27/2019  Please schedule at anytime with [facility].  40 minute appointment  Any questions, please call me at [phone]

## 2020-07-22 ENCOUNTER — Telehealth (INDEPENDENT_AMBULATORY_CARE_PROVIDER_SITE_OTHER): Payer: Medicare Other | Admitting: Nurse Practitioner

## 2020-07-22 ENCOUNTER — Encounter: Payer: Self-pay | Admitting: Nurse Practitioner

## 2020-07-22 DIAGNOSIS — J069 Acute upper respiratory infection, unspecified: Secondary | ICD-10-CM | POA: Diagnosis not present

## 2020-07-22 MED ORDER — AMOXICILLIN-POT CLAVULANATE 875-125 MG PO TABS: 1.0000 | ORAL_TABLET | Freq: Two times a day (BID) | ORAL | 0 refills | Status: DC

## 2020-07-22 NOTE — Progress Notes (Addendum)
There were no vitals taken for this visit.   Subjective:    Patient ID: Aaron Gibbs, male    DOB: 12-11-1934, 86 y.o.   MRN: [id number]  HPI: Aaron Gibbs is a 86 y.o. male  Chief Complaint  Patient presents with  . URI    Pt states he has had a cough, congestion, and SOB for the past 4 days.    UPPER RESPIRATORY TRACT INFECTION Worst symptom: Cough, Congestion, SOB Fever: no Cough: yes Shortness of breath: yes Wheezing: no Chest pain: no Chest tightness: no Chest congestion: yes Nasal congestion: no Runny nose: no Post nasal drip: no Sneezing: no Sore throat: no Swollen glands: no Sinus pressure: no Headache: no Face pain: no Toothache: no Ear pain: no  Ear pressure: no  Eyes red/itching:no Eye drainage/crusting: no  Vomiting: no Rash: no Fatigue: no Sick contacts: no Strep contacts: no  Context: stable Recurrent sinusitis: no Relief with OTC cold/cough medications: no  Treatments attempted: none    Relevant past medical, surgical, family and social history reviewed and updated as indicated. Interim medical history since our last visit reviewed. Allergies and medications reviewed and updated.  Review of Systems  Constitutional: Negative for fever.  HENT: Positive for congestion.   Respiratory: Positive for cough and shortness of breath. Negative for chest tightness and wheezing.   Cardiovascular: Negative for chest pain.    Per HPI unless specifically indicated above     Objective:    There were no vitals taken for this visit.  Wt Readings from Last 3 Encounters:  04/15/20 205 lb (93 kg)  04/06/20 205 lb (93 kg)  03/23/20 205 lb (93 kg)    Physical Exam  Unable to perform to due to visit taking place via phone call.   Results for orders placed or performed in visit on 07/12/20  Urine Culture   Specimen: Urine  Result Value Ref Range   MICRO NUMBER: [id number]    SPECIMEN QUALITY: Adequate    Sample Source NOT GIVEN    STATUS:  FINAL    Result: No Growth   POCT Urinalysis Dipstick  Result Value Ref Range   Color, UA Yellow    Clarity, UA Clear    Glucose, UA Negative Negative   Bilirubin, UA Negative    Ketones, UA Negative    Spec Grav, UA 1.020 1.010 - 1.025   Blood, UA Moderate    pH, UA 6.0 5.0 - 8.0   Protein, UA Positive (A) Negative   Urobilinogen, UA 0.2 0.2 or 1.0 E.U./dL   Nitrite, UA Negative    Leukocytes, UA Negative Negative   Appearance Clear    Odor Negative       Assessment & Plan:   It was recommended to patient that he obtain a chest xray due to his symptoms and being at higher risk for pneumonia.  Patient hesitant to obtain xray.  Recommend patient have COVID 19 test but patient declined.  Reviewed signs and symptoms that patient needs to monitor for and when to seek higher level of care or return to clinic.  Discussed with patient that Levoquin is not warranted at this time.  However, if patient obtains chest xray and Levaquin is warranted it will be prescribed.    Problem List Items Addressed This Visit   None   Visit Diagnoses    Upper respiratory tract infection, unspecified type    -  Primary   Complete course of antibiotics.  Chest xray ordered due to  patient's symptoms.  Recommend COVID test. Reviewed s/s to seek higher level of care.   Relevant Medications   amoxicillin-clavulanate (AUGMENTIN) 875-125 MG tablet   Other Relevant Orders   DG Chest 2 View       Follow up plan: Return if symptoms worsen or fail to improve.    This visit was completed via MyChart due to the restrictions of the COVID-19 pandemic. All issues as above were discussed and addressed. Physical exam was done as above through visual confirmation on MyChart. If it was felt that the patient should be evaluated in the office, they were directed there. The patient verbally consented to this visit. 1. Location of the patient: Home 2. Location of the provider: Provider's Office 3. Those involved with  this call:  ? Provider: Jon Billings, NP ? CMA: Yvonna Alanis ? Front Desk/Registration: Levert Feinstein  4. Time spent on call: 15 minutes with patient face to face via telephone call. More than 50% of this time was spent in counseling and coordination of care. 23 minutes total spent in review of patient's record and preparation of their chart.

## 2020-07-27 ENCOUNTER — Ambulatory Visit (INDEPENDENT_AMBULATORY_CARE_PROVIDER_SITE_OTHER): Payer: Medicare Other

## 2020-07-27 VITALS — Ht 70.0 in | Wt 215.0 lb

## 2020-07-27 DIAGNOSIS — Z0000 Encounter for general adult medical examination without abnormal findings: Secondary | ICD-10-CM

## 2020-07-27 NOTE — Progress Notes (Signed)
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 Function:     6CIT Screen 07/27/2020  What Year? 0 points  What month? 0 points  What time? 0 points  Count back from 20 0 points  Months in reverse 4 points  Repeat phrase 2 points  Total Score 6  Immunizations Immunization History  Administered Date(s) Administered  . Fluad Quad(high Dose 65+) 04/09/2019, 03/05/2020  . PFIZER(Purple Top)SARS-COV-2 Vaccination 07/08/2019, 08/02/2019, 06/01/2020  . Pneumococcal Conjugate-13 04/09/2019    TDAP status: Due, Education has been provided regarding the importance of this vaccine. Advised may receive this vaccine at local pharmacy or Health Dept. Aware to provide a copy of the vaccination record if obtained from local pharmacy or Health Dept. Verbalized acceptance and understanding.  Flu Vaccine status: Up to date  Pneumococcal vaccine status: Due, Education has been provided regarding the importance of this vaccine. Advised may receive this vaccine at local pharmacy or Health Dept. Aware to provide a copy of the vaccination record if obtained from local  pharmacy or Health Dept. Verbalized acceptance and understanding.  Covid-19 vaccine status: Completed vaccines  Qualifies for Shingles Vaccine? Yes   Zostavax completed Yes   Shingrix Completed?: No.    Education has been provided regarding the importance of this vaccine. Patient has been advised to call insurance company to determine out of pocket expense if they have not yet received this vaccine. Advised may also receive vaccine at local pharmacy or Health Dept. Verbalized acceptance and understanding.  Screening Tests Health Maintenance  Topic Date Due  . TETANUS/TDAP  Never done  . PNA vac Low Risk Adult (2 of 2 - PPSV23) 04/08/2020  . INFLUENZA VACCINE  Completed  . COVID-19 Vaccine  Completed    Health Maintenance  Health Maintenance Due  Topic Date Due  . TETANUS/TDAP  Never done  . PNA vac Low Risk Adult (2 of 2 - PPSV23) 04/08/2020    Colorectal cancer screening: No longer required.   Lung Cancer Screening: (Low Dose CT Chest recommended if Age 58-80 years, 30 pack-year currently smoking OR have quit w/in 15years.) does not qualify.   Lung Cancer Screening Referral: no  Additional Screening:  Hepatitis C Screening: does not qualify;   Vision Screening: Recommended annual ophthalmology exams for early detection of glaucoma and other disorders of the eye. Is the patient up to date with their annual eye exam?  Yes  Who is the provider or what is the name of the office in which the patient attends annual eye exams? [facility] If pt is not established with a provider, would they like to be referred to a provider to establish care? No .   Dental Screening: Recommended annual dental exams for proper oral hygiene  Community Resource Referral / Chronic Care Management: CRR required this visit?  No   CCM required this visit?  No      Plan:     I have personally reviewed and noted the following in the patient's chart:   . Medical and social history . Use  of alcohol, tobacco or illicit drugs  . Current medications and supplements . Functional ability and status . Nutritional status . Physical activity . Advanced directives . List of other physicians . Hospitalizations, surgeries, and ER visits in previous 12 months . Vitals . Screenings to include cognitive, depression, and falls . Referrals and appointments  In addition, I have reviewed and discussed with patient certain preventive protocols, quality metrics, and best practice recommendations. A written personalized care plan for preventive services as well as general preventive health recommendations were provided to patient.     Kellie Simmering, LPN   10/18/8839   Nurse Notes:

## 2020-07-27 NOTE — Patient Instructions (Signed)
[name] , Thank you for taking time to come for your Medicare Wellness Visit. I appreciate your ongoing commitment to your health goals. Please review the following plan we discussed and let me know if I can assist you in the future.   Screening recommendations/referrals: Colonoscopy: not required Recommended yearly ophthalmology/optometry visit for glaucoma screening and checkup Recommended yearly dental visit for hygiene and checkup  Vaccinations: Influenza vaccine: completed 03/05/2020, due 01/17/2021 Pneumococcal vaccine: due Tdap vaccine: due Shingles vaccine: discussed   Covid-19: 06/01/2020, 08/02/2019, 07/08/2019  Advanced directives: Advance directive discussed with you today.   Conditions/risks identified: uses chewing tobacco  Next appointment: Follow up in one year for your annual wellness visit.   Preventive Care 84 Years and Older, Male Preventive care refers to lifestyle choices and visits with your health care provider that can promote health and wellness. What does preventive care include?  A yearly physical exam. This is also called an annual well check.  Dental exams once or twice a year.  Routine eye exams. Ask your health care provider how often you should have your eyes checked.  Personal lifestyle choices, including:  Daily care of your teeth and gums.  Regular physical activity.  Eating a healthy diet.  Avoiding tobacco and drug use.  Limiting alcohol use.  Practicing safe sex.  Taking low doses of aspirin every day.  Taking vitamin and mineral supplements as recommended by your health care provider. What happens during an annual well check? The services and screenings done by your health care provider during your annual well check will depend on your age, overall health, lifestyle risk factors, and family history of disease. Counseling  Your health care provider may ask you questions about your:  Alcohol use.  Tobacco use.  Drug  use.  Emotional well-being.  Home and relationship well-being.  Sexual activity.  Eating habits.  History of falls.  Memory and ability to understand (cognition).  Work and work Statistician. Screening  You may have the following tests or measurements:  Height, weight, and BMI.  Blood pressure.  Lipid and cholesterol levels. These may be checked every 5 years, or more frequently if you are over 40 years old.  Skin check.  Lung cancer screening. You may have this screening every year starting at age 84 if you have a 30-pack-year history of smoking and currently smoke or have quit within the past 15 years.  Fecal occult blood test (FOBT) of the stool. You may have this test every year starting at age 84.  Flexible sigmoidoscopy or colonoscopy. You may have a sigmoidoscopy every 5 years or a colonoscopy every 10 years starting at age 84.  Prostate cancer screening. Recommendations will vary depending on your family history and other risks.  Hepatitis C blood test.  Hepatitis B blood test.  Sexually transmitted disease (STD) testing.  Diabetes screening. This is done by checking your blood sugar (glucose) after you have not eaten for a while (fasting). You may have this done every 1-3 years.  Abdominal aortic aneurysm (AAA) screening. You may need this if you are a current or former smoker.  Osteoporosis. You may be screened starting at age 84 if you are at high risk. Talk with your health care provider about your test results, treatment options, and if necessary, the need for more tests. Vaccines  Your health care provider may recommend certain vaccines, such as:  Influenza vaccine. This is recommended every year.  Tetanus, diphtheria, and acellular pertussis (Tdap, Td) vaccine. You  may need a Td booster every 10 years.  Zoster vaccine. You may need this after age 84.  Pneumococcal 13-valent conjugate (PCV13) vaccine. One dose is recommended after age  84.  Pneumococcal polysaccharide (PPSV23) vaccine. One dose is recommended after age 84. Talk to your health care provider about which screenings and vaccines you need and how often you need them. This information is not intended to replace advice given to you by your health care provider. Make sure you discuss any questions you have with your health care provider. Document Released: 07/02/2015 Document Revised: 02/23/2016 Document Reviewed: 04/06/2015 Elsevier Interactive Patient Education  2017 [place] Prevention in the Home Falls can cause injuries. They can happen to people of all ages. There are many things you can do to make your home safe and to help prevent falls. What can I do on the outside of my home?  Regularly fix the edges of walkways and driveways and fix any cracks.  Remove anything that might make you trip as you walk through a door, such as a raised step or threshold.  Trim any bushes or trees on the path to your home.  Use bright outdoor lighting.  Clear any walking paths of anything that might make someone trip, such as rocks or tools.  Regularly check to see if handrails are loose or broken. Make sure that both sides of any steps have handrails.  Any raised decks and porches should have guardrails on the edges.  Have any leaves, snow, or ice cleared regularly.  Use sand or salt on walking paths during winter.  Clean up any spills in your garage right away. This includes oil or grease spills. What can I do in the bathroom?  Use night lights.  Install grab bars by the toilet and in the tub and shower. Do not use towel bars as grab bars.  Use non-skid mats or decals in the tub or shower.  If you need to sit down in the shower, use a plastic, non-slip stool.  Keep the floor dry. Clean up any water that spills on the floor as soon as it happens.  Remove soap buildup in the tub or shower regularly.  Attach bath mats securely with double-sided  non-slip rug tape.  Do not have throw rugs and other things on the floor that can make you trip. What can I do in the bedroom?  Use night lights.  Make sure that you have a light by your bed that is easy to reach.  Do not use any sheets or blankets that are too big for your bed. They should not hang down onto the floor.  Have a firm chair that has side arms. You can use this for support while you get dressed.  Do not have throw rugs and other things on the floor that can make you trip. What can I do in the kitchen?  Clean up any spills right away.  Avoid walking on wet floors.  Keep items that you use a lot in easy-to-reach places.  If you need to reach something above you, use a strong step stool that has a grab bar.  Keep electrical cords out of the way.  Do not use floor polish or wax that makes floors slippery. If you must use wax, use non-skid floor wax.  Do not have throw rugs and other things on the floor that can make you trip. What can I do with my stairs?  Do not leave any items  on the stairs.  Make sure that there are handrails on both sides of the stairs and use them. Fix handrails that are broken or loose. Make sure that handrails are as long as the stairways.  Check any carpeting to make sure that it is firmly attached to the stairs. Fix any carpet that is loose or worn.  Avoid having throw rugs at the top or bottom of the stairs. If you do have throw rugs, attach them to the floor with carpet tape.  Make sure that you have a light switch at the top of the stairs and the bottom of the stairs. If you do not have them, ask someone to add them for you. What else can I do to help prevent falls?  Wear shoes that:  Do not have high heels.  Have rubber bottoms.  Are comfortable and fit you well.  Are closed at the toe. Do not wear sandals.  If you use a stepladder:  Make sure that it is fully opened. Do not climb a closed stepladder.  Make sure that both  sides of the stepladder are locked into place.  Ask someone to hold it for you, if possible.  Clearly mark and make sure that you can see:  Any grab bars or handrails.  First and last steps.  Where the edge of each step is.  Use tools that help you move around (mobility aids) if they are needed. These include:  Canes.  Walkers.  Scooters.  Crutches.  Turn on the lights when you go into a dark area. Replace any light bulbs as soon as they burn out.  Set up your furniture so you have a clear path. Avoid moving your furniture around.  If any of your floors are uneven, fix them.  If there are any pets around you, be aware of where they are.  Review your medicines with your doctor. Some medicines can make you feel dizzy. This can increase your chance of falling. Ask your doctor what other things that you can do to help prevent falls. This information is not intended to replace advice given to you by your health care provider. Make sure you discuss any questions you have with your health care provider. Document Released: 04/01/2009 Document Revised: 11/11/2015 Document Reviewed: 07/10/2014 Elsevier Interactive Patient Education  2017 [employer].

## 2020-08-08 DIAGNOSIS — F1120 Opioid dependence, uncomplicated: Secondary | ICD-10-CM | POA: Insufficient documentation

## 2020-08-08 NOTE — Progress Notes (Signed)
PROVIDER NOTE: Information contained herein reflects review and annotations entered in association with encounter. Interpretation of such information and data should be left to medically-trained personnel. Information provided to patient can be located elsewhere in the medical record under "Patient Instructions". Document created using STT-dictation technology, any transcriptional errors that may result from process are unintentional.    Patient: Aaron Gibbs  Service Category: E/M  Provider: Gaspar Cola, MD  DOB: 11-06-1934  DOS: 08/09/2020  Specialty: Interventional Pain Management  MRN: [id number]  Setting: Ambulatory outpatient  PCP: Olin Hauser, DO  Type: Established Patient    Referring Provider: [name] *  Location: Office  Delivery: Face-to-face     HPI  [name], a 86 y.o. year old male, is here today because of his Chronic pain syndrome [G89.4]. [name] primary complain today is Knee Pain (bilateral) Last encounter: My last encounter with him was on 04/15/2020. Pertinent problems: [name] has Chronic pain syndrome; DISH (diffuse idiopathic skeletal hyperostosis); Osteoarthritis of multiple joints; Chronic knee pain (Primary Area of Pain) (Bilateral) (L>R); Chronic low back pain (Bilateral) w/o sciatica; Somatic symptom disorder; Tricompartment osteoarthritis of knee (Left); Osteoarthritis of knee (Bilateral); Osteoarthritis of patellofemoral joint (Right); Neurogenic pain; Lateral meniscal tear, sequela (Left); Medial meniscal tear, sequela (Left); Patellar tendinosis (Right); and Osteoarthritis of knee (Left) on their pertinent problem list. Pain Assessment: Severity of Chronic pain is reported as a 7 /10. Location: Knee Right,Left/denies. Onset: More than a month ago. Quality: Constant. Timing: Constant. Modifying factor(s): "pain patch". Vitals:  height is '5\' 11"'  (1.803 m) and weight is 205 lb (93 kg). His temporal temperature is  97.2 F (36.2 C) (abnormal). His blood pressure is 142/69 (abnormal) and his pulse is 74. His respiration is 16 and oxygen saturation is 97%.   Reason for encounter: medication management. The patient indicates doing well with the current medication regimen. No adverse reactions or side effects reported to the medications.  The patient indicates that his knee pain is getting worse.  The radiofrequency ablation did help, but this was done on 04/06/2020 and it seems to be returning now with the left side being worse than the right..  We will go ahead and schedule him to have it repeated since he did confirm that the radiofrequency helped.  RTCB: 11/03/2020  Pharmacotherapy Assessment   Analgesic: No opioid analgesics prescribed by our practice.  Buprenorphine 10 mcg/h patch q. 7 days.  High risk for SUD.  History of suicidal attempts and noncompliance with medication intake. ED visits thought to be due to medication binging. MME/day: 0 mg/day.   Monitoring: [place] PMP: PDMP reviewed during this encounter.       Pharmacotherapy: No side-effects or adverse reactions reported. Compliance: No problems identified. Effectiveness: Clinically acceptable.  Landis Martins, RN  08/09/2020 12:48 PM  Sign when Signing Visit Nursing Pain Medication Assessment:  Safety precautions to be maintained throughout the outpatient stay will include: orient to surroundings, keep bed in low position, maintain call bell within reach at all times, provide assistance with transfer out of bed and ambulation.  Medication Inspection Compliance: [name] did not comply with our request to bring his pills to be counted. He was reminded that bringing the medication bottles, even when empty, is a requirement.  Medication: None brought in. Pill/Patch Count: None available to be counted. Bottle Appearance: No container available. Did not bring bottle(s) to appointment. Filled Date: N/A Last Medication intake:  Today   Last  applied 8 days  ago, continues to wear that patch    UDS:  Summary  Date Value Ref Range Status  02/02/2020 Note  Final    Comment:    ==================================================================== [employer] 13 (MW) ==================================================================== Test                             Result       Flag       Units  Drug Present and Declared for Prescription Verification   Buprenorphine                  14           EXPECTED   ng/mg creat   Norbuprenorphine               22           EXPECTED   ng/mg creat    Source of buprenorphine is a scheduled prescription medication.    Norbuprenorphine is an expected metabolite of buprenorphine.  Drug Present not Declared for Prescription Verification   Oxazepam                       21           UNEXPECTED ng/mg creat    Oxazepam may be administered as a scheduled prescription medication;    it is also an expected metabolite of other benzodiazepine drugs,    including diazepam, chlordiazepoxide, prazepam, clorazepate,    halazepam, and temazepam.    Lorazepam                      730          UNEXPECTED ng/mg creat    Source of lorazepam is a scheduled prescription medication.  ==================================================================== Test                      Result    Flag   Units      Ref Range   Creatinine              105              mg/dL      >=20 ==================================================================== Declared Medications:  The flagging and interpretation on this report are based on the  following declared medications.  Unexpected results may arise from  inaccuracies in the declared medications.   **Note: The testing scope of this panel does not include small to  moderate amounts of these reported medications:   Buprenorphine Patch (BuTrans)   **Note: The testing scope of this panel does not include the  following reported medications:   Albuterol   Amlodipine  Aspirin  Diclofenac (Voltaren)  Duloxetine (Cymbalta)  Fenofibrate (TriCor)  Iron  Multivitamin  Pantoprazole (Protonix)  Risperidone  Timolol  Trazodone  Umeclidinium (Anoro)  Vilanterol (Anoro) ==================================================================== For clinical consultation, please call [phone]. ====================================================================      ROS  Constitutional: Denies any fever or chills Gastrointestinal: No reported hemesis, hematochezia, vomiting, or acute GI distress Musculoskeletal: Denies any acute onset joint swelling, redness, loss of ROM, or weakness Neurological: No reported episodes of acute onset apraxia, aphasia, dysarthria, agnosia, amnesia, paralysis, loss of coordination, or loss of consciousness  Medication Review  DULoxetine, LORazepam, QUEtiapine, albuterol, amLODipine, buprenorphine, diclofenac sodium, dorzolamide-timolol, fenofibrate, ferrous sulfate, fluticasone, hydrOXYzine, multivitamin with minerals, mupirocin cream, pantoprazole, prednisoLONE acetate, risperiDONE, tamsulosin, traMADol, and traZODone  History Review  Allergy:  [name] is allergic to azithromycin. Drug: [name]  reports no history of drug use. Alcohol:  reports previous alcohol use. Tobacco:  reports that he has quit smoking. He quit after 20.00 years of use. His smokeless tobacco use includes chew. Social: [name]  reports that he has quit smoking. He quit after 20.00 years of use. His smokeless tobacco use includes chew. He reports previous alcohol use. He reports that he does not use drugs. Medical:  has a past medical history of Anxiety, Cancer ([place]), COPD (chronic obstructive pulmonary disease) ([place]), Glaucoma, and Osteoporosis. Surgical: [name]  has a past surgical history that includes Cholecystectomy and Transurethral resection of prostate (N/A, 2008). Family: family history includes Depression in his  mother; Heart disease in his father.  Laboratory Chemistry Profile   Renal Lab Results  Component Value Date   BUN 25 (H) 02/09/2020   CREATININE 1.04 02/09/2020   LABCREA 631 12/13/2019   BCR 28 (H) 07/10/2018   GFRAA >60 02/09/2020   GFRNONAA >60 02/09/2020     Hepatic Lab Results  Component Value Date   AST 28 02/08/2020   ALT 29 02/08/2020   ALBUMIN 4.2 02/08/2020   ALKPHOS 48 02/08/2020   LIPASE 24 06/04/2018   AMMONIA 25 09/13/2019     Electrolytes Lab Results  Component Value Date   NA 136 02/09/2020   K 3.9 02/09/2020   CL 105 02/09/2020   CALCIUM 9.6 02/09/2020   MG 2.3 04/02/2019   PHOS 3.4 03/30/2019     Bone Lab Results  Component Value Date   VD25OH 26 08/15/2016   25OHVITD1 34 07/10/2018   25OHVITD2 <1.0 07/10/2018   25OHVITD3 34 07/10/2018     Inflammation (CRP: Acute Phase) (ESR: Chronic Phase) Lab Results  Component Value Date   CRP 5 07/10/2018   ESRSEDRATE 20 07/10/2018   LATICACIDVEN 1.1 12/13/2019       Note: Above Lab results reviewed.  Recent Imaging Review  DG PAIN CLINIC C-ARM 1-60 MIN NO REPORT Fluoro was used, but no Radiologist interpretation will be provided.  Please refer to "NOTES" tab for provider progress note. Note: Reviewed        Physical Exam  General appearance: Well nourished, well developed, and well hydrated. In no apparent acute distress Mental status: Alert, oriented x 3 (person, place, & time)       Respiratory: No evidence of acute respiratory distress Eyes: PERLA Vitals: BP (!) 142/69   Pulse 74   Temp (!) 97.2 F (36.2 C) (Temporal)   Resp 16   Ht '5\' 11"'  (1.803 m)   Wt 205 lb (93 kg)   SpO2 97%   BMI 28.59 kg/m  BMI: Estimated body mass index is 28.59 kg/m as calculated from the following:   Height as of this encounter: '5\' 11"'  (1.803 m).   Weight as of this encounter: 205 lb (93 kg). Ideal: Ideal body weight: 75.3 kg (166 lb 0.1 oz) Adjusted ideal body weight: 82.4 kg (181 lb 9.7  oz)  Assessment   Status Diagnosis  Controlled Controlled Controlled 1. Chronic pain syndrome   2. Chronic knee pain (Primary Area of Pain) (Bilateral) (L>R)   3. Chronic low back pain (Bilateral) w/o sciatica   4. DISH (diffuse idiopathic skeletal hyperostosis)   5. Pharmacologic therapy   6. History of suicide attempt (06/12/18)   7. Long-term current use of benzodiazepine   8. Opioid dependence with or without physiological dependence (HCC)   9. Opioid dependence, binge  pattern ([place])   10. Noncompliance with medication treatment due to overuse of medication   11. Osteoarthritis of knee (Bilateral)      Updated Problems: Problem  Long-Term Current Use of Benzodiazepine  Opioid Dependence, Binge Pattern (Hcc)  Opioid Dependence With Or Without Physiological Dependence (Hcc)  Aortic Atherosclerosis (Hcc)  Generalized Anxiety Disorder With Panic Attacks    Plan of Care  Problem-specific:  No problem-specific Assessment & Plan notes found for this encounter.  [name] has a current medication list which includes the following long-term medication(s): albuterol, amlodipine, [START ON 08/11/2020] buprenorphine, [START ON 09/08/2020] buprenorphine, [START ON 10/06/2020] buprenorphine, duloxetine, fenofibrate, ferrous sulfate, fluticasone, pantoprazole, quetiapine, risperidone, and trazodone.  Pharmacotherapy (Medications Ordered): Meds ordered this encounter  Medications  . buprenorphine (BUTRANS) 10 MCG/HR PTWK    Sig: Place 1 patch onto the skin once a week for 28 days. Apply only 1 patch at a time and alternate sites weekly.    Dispense:  4 patch    Refill:  0    Chronic Pain: STOP Act (Not applicable) Fill 1 day early if closed on refill date. Avoid benzodiazepines within 8 hours of opioids  . buprenorphine (BUTRANS) 10 MCG/HR PTWK    Sig: Place 1 patch onto the skin once a week for 28 days. Apply only 1 patch at a time and alternate sites weekly.    Dispense:  4  patch    Refill:  0    Chronic Pain: STOP Act (Not applicable) Fill 1 day early if closed on refill date. Avoid benzodiazepines within 8 hours of opioids  . buprenorphine (BUTRANS) 10 MCG/HR PTWK    Sig: Place 1 patch onto the skin once a week for 28 days. Apply only 1 patch at a time and alternate sites weekly.    Dispense:  4 patch    Refill:  0    Chronic Pain: STOP Act (Not applicable) Fill 1 day early if closed on refill date. Avoid benzodiazepines within 8 hours of opioids   Orders:  Orders Placed This Encounter  Procedures  . Radiofrequency,Genicular    Standing Status:   Future    Standing Expiration Date:   08/09/2021    Scheduling Instructions:     Side(s): Left Knee     Level(s): Superior-Lateral, Superior-Medial, and Inferior-Medial Genicular Nerve(s)     Sedation: With Sedation.     Scheduling Timeframe: As soon as pre-approved    Order Specific Question:   Where will this procedure be performed?    Answer:   [facility]   Follow-up plan:   Return in about 3 months (around 11/03/2020) for RFA (72mn): (L) Genicular Nerve RFA #2.     Interventional Therapies  Risk  Complexity Considerations:   Patient is legally blind. High risk for substance use disorder.  The patient has documented suicidal attempt using medications.  In addition, we have documented the patient to have a binge use pattern and been noncompliant with the instructions on how to use the medications.  This is a reason why went to a buprenorphine patch.   Planned  Pending:   Repeat bilateral genicular nerve RFA #3    Under consideration:   Palliative bilateral genicular nerve RFA maintenance treatments   Completed:   Palliative bilateral IA Hyalgan knee injections Sx2  Diagnostic bilateral genicular NB x1  Palliative right genicular nerve RFA x2 (04/06/2020)  Palliative left genicular nerve RFA x2 (03/23/2020)    Therapeutic  Palliative (PRN) options:  Palliative bilateral IA Hyalgan knee  injections S3/N1  Diagnostic bilateral genicular NB #2  Palliative bilateral genicular nerve RFA #3     Recent Visits No visits were found meeting these conditions. Showing recent visits within past 90 days and meeting all other requirements Today's Visits Date Type Provider Dept  08/09/20 Office Visit Milinda Pointer, MD [facility]  Showing today's visits and meeting all other requirements Future Appointments No visits were found meeting these conditions. Showing future appointments within next 90 days and meeting all other requirements  I discussed the assessment and treatment plan with the patient. The patient was provided an opportunity to ask questions and all were answered. The patient agreed with the plan and demonstrated an understanding of the instructions.  Patient advised to call back or seek an in-person evaluation if the symptoms or condition worsens.  Duration of encounter: 30 minutes.  Note by: Gaspar Cola, MD Date: 08/09/2020; Time: 1:12 PM

## 2020-08-09 ENCOUNTER — Ambulatory Visit: Payer: Medicare Other | Attending: Pain Medicine | Admitting: Pain Medicine

## 2020-08-09 ENCOUNTER — Other Ambulatory Visit: Payer: Self-pay

## 2020-08-09 ENCOUNTER — Encounter: Payer: Self-pay | Admitting: Pain Medicine

## 2020-08-09 ENCOUNTER — Other Ambulatory Visit: Payer: Self-pay | Admitting: Pain Medicine

## 2020-08-09 VITALS — BP 142/69 | HR 74 | Temp 97.2°F | Resp 16 | Ht 71.0 in | Wt 205.0 lb

## 2020-08-09 DIAGNOSIS — M4810 Ankylosing hyperostosis [Forestier], site unspecified: Secondary | ICD-10-CM | POA: Diagnosis present

## 2020-08-09 DIAGNOSIS — M545 Low back pain, unspecified: Secondary | ICD-10-CM | POA: Insufficient documentation

## 2020-08-09 DIAGNOSIS — M170 Bilateral primary osteoarthritis of knee: Secondary | ICD-10-CM | POA: Insufficient documentation

## 2020-08-09 DIAGNOSIS — G894 Chronic pain syndrome: Secondary | ICD-10-CM | POA: Insufficient documentation

## 2020-08-09 DIAGNOSIS — M25561 Pain in right knee: Secondary | ICD-10-CM | POA: Insufficient documentation

## 2020-08-09 DIAGNOSIS — M25562 Pain in left knee: Secondary | ICD-10-CM | POA: Insufficient documentation

## 2020-08-09 DIAGNOSIS — F1120 Opioid dependence, uncomplicated: Secondary | ICD-10-CM | POA: Diagnosis present

## 2020-08-09 DIAGNOSIS — G8929 Other chronic pain: Secondary | ICD-10-CM | POA: Insufficient documentation

## 2020-08-09 DIAGNOSIS — Z9151 Personal history of suicidal behavior: Secondary | ICD-10-CM | POA: Insufficient documentation

## 2020-08-09 DIAGNOSIS — Z79899 Other long term (current) drug therapy: Secondary | ICD-10-CM | POA: Insufficient documentation

## 2020-08-09 DIAGNOSIS — Z9114 Patient's other noncompliance with medication regimen: Secondary | ICD-10-CM | POA: Diagnosis present

## 2020-08-09 MED ORDER — BUPRENORPHINE 10 MCG/HR TD PTWK: 1.0000 | MEDICATED_PATCH | TRANSDERMAL | 0 refills | Status: DC

## 2020-08-09 NOTE — Progress Notes (Signed)
Nursing Pain Medication Assessment:  Safety precautions to be maintained throughout the outpatient stay will include: orient to surroundings, keep bed in low position, maintain call bell within reach at all times, provide assistance with transfer out of bed and ambulation.  Medication Inspection Compliance: Mr. Centrella did not comply with our request to bring his pills to be counted. He was reminded that bringing the medication bottles, even when empty, is a requirement.  Medication: None brought in. Pill/Patch Count: None available to be counted. Bottle Appearance: No container available. Did not bring bottle(s) to appointment. Filled Date: N/A Last Medication intake:  Today   Last applied 8 days ago, continues to wear that patch

## 2020-08-09 NOTE — Patient Instructions (Signed)
____________________________________________________________________________________________  Preparing for Procedure with Sedation  Procedure appointments are limited to planned procedures: . No Prescription Refills. . No disability issues will be discussed. . No medication changes will be discussed.  Instructions: . Oral Intake: Do not eat or drink anything for at least 8 hours prior to your procedure. (Exception: Blood Pressure Medication. See below.) . Transportation: Unless otherwise stated by your physician, you may drive yourself after the procedure. . Blood Pressure Medicine: Do not forget to take your blood pressure medicine with a sip of water the morning of the procedure. If your Diastolic (lower reading)is above 100 mmHg, elective cases will be cancelled/rescheduled. . Blood thinners: These will need to be stopped for procedures. Notify our staff if you are taking any blood thinners. Depending on which one you take, there will be specific instructions on how and when to stop it. . Diabetics on insulin: Notify the staff so that you can be scheduled 1st case in the morning. If your diabetes requires high dose insulin, take only  of your normal insulin dose the morning of the procedure and notify the staff that you have done so. . Preventing infections: Shower with an antibacterial soap the morning of your procedure. . Build-up your immune system: Take 1000 mg of Vitamin C with every meal (3 times a day) the day prior to your procedure. . Antibiotics: Inform the staff if you have a condition or reason that requires you to take antibiotics before dental procedures. . Pregnancy: If you are pregnant, call and cancel the procedure. . Sickness: If you have a cold, fever, or any active infections, call and cancel the procedure. . Arrival: You must be in the facility at least 30 minutes prior to your scheduled procedure. . Children: Do not bring children with you. . Dress appropriately:  Bring dark clothing that you would not mind if they get stained. . Valuables: Do not bring any jewelry or valuables.  Reasons to call and reschedule or cancel your procedure: (Following these recommendations will minimize the risk of a serious complication.) . Surgeries: Avoid having procedures within 2 weeks of any surgery. (Avoid for 2 weeks before or after any surgery). . Flu Shots: Avoid having procedures within 2 weeks of a flu shots or . (Avoid for 2 weeks before or after immunizations). . Barium: Avoid having a procedure within 7-10 days after having had a radiological study involving the use of radiological contrast. (Myelograms, Barium swallow or enema study). . Heart attacks: Avoid any elective procedures or surgeries for the initial 6 months after a "Myocardial Infarction" (Heart Attack). . Blood thinners: It is imperative that you stop these medications before procedures. Let us know if you if you take any blood thinner.  . Infection: Avoid procedures during or within two weeks of an infection (including chest colds or gastrointestinal problems). Symptoms associated with infections include: Localized redness, fever, chills, night sweats or profuse sweating, burning sensation when voiding, cough, congestion, stuffiness, runny nose, sore throat, diarrhea, nausea, vomiting, cold or Flu symptoms, recent or current infections. It is specially important if the infection is over the area that we intend to treat. . Heart and lung problems: Symptoms that may suggest an active cardiopulmonary problem include: cough, chest pain, breathing difficulties or shortness of breath, dizziness, ankle swelling, uncontrolled high or unusually low blood pressure, and/or palpitations. If you are experiencing any of these symptoms, cancel your procedure and contact your primary care physician for an evaluation.  Remember:  Regular Business hours are:    Monday to Thursday 8:00 AM to 4:00 PM  Provider's  Schedule: Leather Estis, MD:  Procedure days: Tuesday and Thursday 7:30 AM to 4:00 PM  Bilal Lateef, MD:  Procedure days: Monday and Wednesday 7:30 AM to 4:00 PM ____________________________________________________________________________________________   ____________________________________________________________________________________________  General Risks and Possible Complications  Patient Responsibilities: It is important that you read this as it is part of your informed consent. It is our duty to inform you of the risks and possible complications associated with treatments offered to you. It is your responsibility as a patient to read this and to ask questions about anything that is not clear or that you believe was not covered in this document.  Patient's Rights: You have the right to refuse treatment. You also have the right to change your mind, even after initially having agreed to have the treatment done. However, under this last option, if you wait until the last second to change your mind, you may be charged for the materials used up to that point.  Introduction: Medicine is not an exact science. Everything in Medicine, including the lack of treatment(s), carries the potential for danger, harm, or loss (which is by definition: Risk). In Medicine, a complication is a secondary problem, condition, or disease that can aggravate an already existing one. All treatments carry the risk of possible complications. The fact that a side effects or complications occurs, does not imply that the treatment was conducted incorrectly. It must be clearly understood that these can happen even when everything is done following the highest safety standards.  No treatment: You can choose not to proceed with the proposed treatment alternative. The "PRO(s)" would include: avoiding the risk of complications associated with the therapy. The "CON(s)" would include: not getting any of the treatment  benefits. These benefits fall under one of three categories: diagnostic; therapeutic; and/or palliative. Diagnostic benefits include: getting information which can ultimately lead to improvement of the disease or symptom(s). Therapeutic benefits are those associated with the successful treatment of the disease. Finally, palliative benefits are those related to the decrease of the primary symptoms, without necessarily curing the condition (example: decreasing the pain from a flare-up of a chronic condition, such as incurable terminal cancer).  General Risks and Complications: These are associated to most interventional treatments. They can occur alone, or in combination. They fall under one of the following six (6) categories: no benefit or worsening of symptoms; bleeding; infection; nerve damage; allergic reactions; and/or death. 1. No benefits or worsening of symptoms: In Medicine there are no guarantees, only probabilities. No healthcare provider can ever guarantee that a medical treatment will work, they can only state the probability that it may. Furthermore, there is always the possibility that the condition may worsen, either directly, or indirectly, as a consequence of the treatment. 2. Bleeding: This is more common if the patient is taking a blood thinner, either prescription or over the counter (example: Goody Powders, Fish oil, Aspirin, Garlic, etc.), or if suffering a condition associated with impaired coagulation (example: Hemophilia, cirrhosis of the liver, low platelet counts, etc.). However, even if you do not have one on these, it can still happen. If you have any of these conditions, or take one of these drugs, make sure to notify your treating physician. 3. Infection: This is more common in patients with a compromised immune system, either due to disease (example: diabetes, cancer, human immunodeficiency virus [HIV], etc.), or due to medications or treatments (example: therapies used to treat  cancer and   rheumatological diseases). However, even if you do not have one on these, it can still happen. If you have any of these conditions, or take one of these drugs, make sure to notify your treating physician. 4. Nerve Damage: This is more common when the treatment is an invasive one, but it can also happen with the use of medications, such as those used in the treatment of cancer. The damage can occur to small secondary nerves, or to large primary ones, such as those in the spinal cord and brain. This damage may be temporary or permanent and it may lead to impairments that can range from temporary numbness to permanent paralysis and/or brain death. 5. Allergic Reactions: Any time a substance or material comes in contact with our body, there is the possibility of an allergic reaction. These can range from a mild skin rash (contact dermatitis) to a severe systemic reaction (anaphylactic reaction), which can result in death. 6. Death: In general, any medical intervention can result in death, most of the time due to an unforeseen complication. ____________________________________________________________________________________________  ____________________________________________________________________________________________  Medication Rules  Purpose: To inform patients, and their family members, of our rules and regulations.  Applies to: All patients receiving prescriptions (written or electronic).  Pharmacy of record: Pharmacy where electronic prescriptions will be sent. If written prescriptions are taken to a different pharmacy, please inform the nursing staff. The pharmacy listed in the electronic medical record should be the one where you would like electronic prescriptions to be sent.  Electronic prescriptions: In compliance with the [place] (STOP) Act of 2017 (Session Lanny Cramp [phone]), effective June 19, 2018, all controlled substances must  be electronically prescribed. Calling prescriptions to the pharmacy will cease to exist.  Prescription refills: Only during scheduled appointments. Applies to all prescriptions.  NOTE: The following applies primarily to controlled substances (Opioid* Pain Medications).   Type of encounter (visit): For patients receiving controlled substances, face-to-face visits are required. (Not an option or up to the patient.)  Patient's responsibilities: 1. Pain Pills: Bring all pain pills to every appointment (except for procedure appointments). 2. Pill Bottles: Bring pills in original pharmacy bottle. Always bring the newest bottle. Bring bottle, even if empty. 3. Medication refills: You are responsible for knowing and keeping track of what medications you take and those you need refilled. The day before your appointment: write a list of all prescriptions that need to be refilled. The day of the appointment: give the list to the admitting nurse. Prescriptions will be written only during appointments. No prescriptions will be written on procedure days. If you forget a medication: it will not be "Called in", "Faxed", or "electronically sent". You will need to get another appointment to get these prescribed. No early refills. Do not call asking to have your prescription filled early. 4. Prescription Accuracy: You are responsible for carefully inspecting your prescriptions before leaving our office. Have the discharge nurse carefully go over each prescription with you, before taking them home. Make sure that your name is accurately spelled, that your address is correct. Check the name and dose of your medication to make sure it is accurate. Check the number of pills, and the written instructions to make sure they are clear and accurate. Make sure that you are given enough medication to last until your next medication refill appointment. 5. Taking Medication: Take medication as prescribed. When it comes to  controlled substances, taking less pills or less frequently than prescribed is permitted and encouraged. Never  take more pills than instructed. Never take medication more frequently than prescribed.  6. Inform other Doctors: Always inform, all of your healthcare providers, of all the medications you take. 7. Pain Medication from other Providers: You are not allowed to accept any additional pain medication from any other Doctor or Healthcare provider. There are two exceptions to this rule. (see below) In the event that you require additional pain medication, you are responsible for notifying us, as stated below. 8. Cough Medicine: Often these contain an opioid, such as codeine or hydrocodone. Never accept or take cough medicine containing these opioids if you are already taking an opioid* medication. The combination may cause respiratory failure and death. 9. Medication Agreement: You are responsible for carefully reading and following our Medication Agreement. This must be signed before receiving any prescriptions from our practice. Safely store a copy of your signed Agreement. Violations to the Agreement will result in no further prescriptions. (Additional copies of our Medication Agreement are available upon request.) 10. Laws, Rules, & Regulations: All patients are expected to follow all Federal and [employer], Rules, [employer] [facility]. Ignorance of the Laws does not constitute a valid excuse.  11. Illegal drugs and Controlled Substances: The use of illegal substances (including, but not limited to marijuana and its derivatives) and/or the illegal use of any controlled substances is strictly prohibited. Violation of this rule may result in the immediate and permanent discontinuation of any and all prescriptions being written by our practice. The use of any illegal substances is prohibited. 12. Adopted CDC guidelines & recommendations: Target dosing levels will be at or below 60 MME/day. Use of  benzodiazepines** is not recommended.  Exceptions: There are only two exceptions to the rule of not receiving pain medications from other Healthcare Providers. 1. Exception #1 (Emergencies): In the event of an emergency (i.e.: accident requiring emergency care), you are allowed to receive additional pain medication. However, you are responsible for: As soon as you are able, call our office [phone], at any time of the day or night, and leave a message stating your name, the date and nature of the emergency, and the name and dose of the medication prescribed. In the event that your call is answered by a member of our staff, make sure to document and save the date, time, and the name of the person that took your information.  2. Exception #2 (Planned Surgery): In the event that you are scheduled by another doctor or dentist to have any type of surgery or procedure, you are allowed (for a period no longer than 30 days), to receive additional pain medication, for the acute post-op pain. However, in this case, you are responsible for picking up a copy of our "Post-op Pain Management for Surgeons" handout, and giving it to your surgeon or dentist. This document is available at our office, and does not require an appointment to obtain it. Simply go to our office during business hours (Monday-Thursday from 8:00 AM to 4:00 PM) (Friday 8:00 AM to 12:00 Noon) or if you have a scheduled appointment with [place], prior to your surgery, and ask for it by name. In addition, you are responsible for: calling our office [phone], at any time of the day or night, and leaving a message stating your name, name of your surgeon, type of surgery, and date of procedure or surgery. Failure to comply with your responsibilities may result in termination of therapy involving the controlled substances.  *Opioid medications include: morphine, codeine,  oxycodone, oxymorphone, hydrocodone, hydromorphone, meperidine, tramadol,  tapentadol, buprenorphine, fentanyl, methadone. **Benzodiazepine medications include: diazepam (Valium), alprazolam (Xanax), clonazepam (Klonopine), lorazepam (Ativan), clorazepate (Tranxene), chlordiazepoxide (Librium), estazolam (Prosom), oxazepam (Serax), temazepam (Restoril), triazolam (Halcion) (Last updated: 05/17/2020) ____________________________________________________________________________________________   ____________________________________________________________________________________________  Medication Recommendations and Reminders  Applies to: All patients receiving prescriptions (written and/or electronic).  Medication Rules & Regulations: These rules and regulations exist for your safety and that of others. They are not flexible and neither are we. Dismissing or ignoring them will be considered "non-compliance" with medication therapy, resulting in complete and irreversible termination of such therapy. (See document titled "Medication Rules" for more details.) In all conscience, because of safety reasons, we cannot continue providing a therapy where the patient does not follow instructions.  Pharmacy of record:   Definition: This is the pharmacy where your electronic prescriptions will be sent.   We do not endorse any particular pharmacy, however, we have experienced problems with [facility] not securing enough medication supply for the community.  We do not restrict you in your choice of pharmacy. However, once we write for your prescriptions, we will NOT be re-sending more prescriptions to fix restricted supply problems created by your pharmacy, or your insurance.   The pharmacy listed in the electronic medical record should be the one where you want electronic prescriptions to be sent.  If you choose to change pharmacy, simply notify our nursing staff.  Recommendations:  Keep all of your pain medications in a safe place, under lock and key, even if you live alone.  We will NOT replace lost, stolen, or damaged medication.  After you fill your prescription, take 1 week's worth of pills and put them away in a safe place. You should keep a separate, properly labeled bottle for this purpose. The remainder should be kept in the original bottle. Use this as your primary supply, until it runs out. Once it's gone, then you know that you have 1 week's worth of medicine, and it is time to come in for a prescription refill. If you do this correctly, it is unlikely that you will ever run out of medicine.  To make sure that the above recommendation works, it is very important that you make sure your medication refill appointments are scheduled at least 1 week before you run out of medicine. To do this in an effective manner, make sure that you do not leave the office without scheduling your next medication management appointment. Always ask the nursing staff to show you in your prescription , when your medication will be running out. Then arrange for the receptionist to get you a return appointment, at least 7 days before you run out of medicine. Do not wait until you have 1 or 2 pills left, to come in. This is very poor planning and does not take into consideration that we may need to cancel appointments due to bad weather, sickness, or emergencies affecting our staff.  DO NOT ACCEPT A "Partial Fill": If for any reason your pharmacy does not have enough pills/tablets to completely fill or refill your prescription, do not allow for a "partial fill". The law allows the pharmacy to complete that prescription within 72 hours, without requiring a new prescription. If they do not fill the rest of your prescription within those 72 hours, you will need a separate prescription to fill the remaining amount, which we will NOT provide. If the reason for the partial fill is your insurance, you will need to talk to the pharmacist  about payment alternatives for the remaining tablets, but again, DO  NOT ACCEPT A PARTIAL FILL, unless you can trust your pharmacist to obtain the remainder of the pills within 72 hours.  Prescription refills and/or changes in medication(s):   Prescription refills, and/or changes in dose or medication, will be conducted only during scheduled medication management appointments. (Applies to both, written and electronic prescriptions.)  No refills on procedure days. No medication will be changed or started on procedure days. No changes, adjustments, and/or refills will be conducted on a procedure day. Doing so will interfere with the diagnostic portion of the procedure.  No phone refills. No medications will be "called into the pharmacy".  No Fax refills.  No weekend refills.  No Holliday refills.  No after hours refills.  Remember:  Business hours are:  Monday to Thursday 8:00 AM to 4:00 PM Provider's Schedule: Milinda Pointer, MD - Appointments are:  Medication management: Monday and Wednesday 8:00 AM to 4:00 PM Procedure day: Tuesday and Thursday 7:30 AM to 4:00 PM Gillis Santa, MD - Appointments are:  Medication management: Tuesday and Thursday 8:00 AM to 4:00 PM Procedure day: Monday and Wednesday 7:30 AM to 4:00 PM (Last update: 01/07/2020) ____________________________________________________________________________________________

## 2020-08-10 ENCOUNTER — Telehealth: Payer: Self-pay | Admitting: Pain Medicine

## 2020-08-10 NOTE — Telephone Encounter (Signed)
It is to be filled tomorrow. Called and informed patient

## 2020-08-10 NOTE — Telephone Encounter (Signed)
Patient called pharmacy to get script ready to fill and was told they have nothing on file. Please check with pharmacy and let patient know status

## 2020-08-11 ENCOUNTER — Telehealth: Payer: Self-pay

## 2020-08-11 ENCOUNTER — Other Ambulatory Visit: Payer: Self-pay | Admitting: Family Medicine

## 2020-08-11 DIAGNOSIS — F333 Major depressive disorder, recurrent, severe with psychotic symptoms: Secondary | ICD-10-CM

## 2020-08-11 NOTE — Telephone Encounter (Signed)
Signed the printed note. To be returned to patient for pick up  [name], [facility] [employer] 08/11/2020, 11:26 AM

## 2020-08-11 NOTE — Telephone Encounter (Signed)
Copied from [place] [phone]. Topic: Medical Record Request - Other >> Aug 10, 2020 12:04 PM Alanda Slim E wrote: Patient is trying to get another social security card and the Social security office advised them to call his pcp and advised for the pcpc to take a medical record and extract his name and DOB and put it on the office letterhead and sign and date it / please advise when ready for pick

## 2020-08-11 NOTE — Telephone Encounter (Signed)
Copied from [place] [id number]. Topic: Medical Record Request - Other >> Aug 10, 2020 12:04 PM Alanda Slim E wrote: Patient is trying to get another social security card and the Social security office advised them to call his pcp and advised for the pcpc to take a medical record and extract his name and DOB and put it on the office letterhead and sign and date it / please advise when ready for pick

## 2020-08-17 NOTE — Telephone Encounter (Signed)
Hildred Priest can you sign your encounter note for this patient please so I can close. Thanks.

## 2020-08-28 NOTE — Progress Notes (Deleted)
PROVIDER NOTE: Information contained herein reflects review and annotations entered in association with encounter. Interpretation of such information and data should be left to medically-trained personnel. Information provided to patient can be located elsewhere in the medical record under "Patient Instructions". Document created using STT-dictation technology, any transcriptional errors that may result from process are unintentional.    Patient: Aaron Gibbs  Service Category: Procedure  Provider: Gaspar Cola, MD  DOB: [birth year]  DOS: 09/02/2020  Location: [facility]  MRN: [id number]  Setting: Ambulatory - outpatient  Referring Provider: Milinda Pointer, MD  Type: Established Patient  Specialty: Interventional Pain Management  PCP: Olin Hauser, DO   Primary Reason for Visit: Interventional Pain Management Treatment. CC: No chief complaint on file.  Procedure:          Anesthesia, Analgesia, Anxiolysis:  Type: Therapeutic Superolateral, Superomedial, and Inferomedial, Genicular Nerve Radiofrequency Ablation (destruction).   #2  Region: Lateral, Anterior, and Medial aspects of the knee joint, above and below the knee joint proper. Level: Superior and inferior to the knee joint. Laterality: Left  Type: Moderate (Conscious) Sedation combined with Local Anesthesia Indication(s): Analgesia and Anxiety Route: Intravenous (IV) IV Access: Secured Sedation: Meaningful verbal contact was maintained at all times during the procedure  Local Anesthetic: Lidocaine 1-2%  Position: Supine   Indications: No diagnosis found. Aaron Gibbs has been dealing with the above chronic pain for longer than three months and has either failed to respond, was unable to tolerate, or simply did not get enough benefit from other more conservative therapies including, but not limited to: 1. Over-the-counter medications 2. Anti-inflammatory medications 3. Muscle relaxants 4.  Membrane stabilizers 5. Opioids 6. Physical therapy and/or chiropractic manipulation 7. Modalities (Heat, ice, etc.) 8. Invasive techniques such as nerve blocks. Aaron Gibbs has attained more than 50% relief of the pain from a series of diagnostic injections conducted in separate occasions.  Pain Score: Pre-procedure:  /10 Post-procedure:  /10  Pre-op H&P Assessment:  Aaron Gibbs is a 86 y.o. (year old), male patient, seen today for interventional treatment. He  has a past surgical history that includes Cholecystectomy and Transurethral resection of prostate (N/A, 2008). Aaron Gibbs has a current medication list which includes the following prescription(s): albuterol, amlodipine, buprenorphine, [START ON 09/08/2020] buprenorphine, [START ON 10/06/2020] buprenorphine, cosopt, diclofenac sodium, duloxetine, fenofibrate, ferrous sulfate, fluticasone, hydroxyzine, lorazepam, multivitamin with minerals, mupirocin cream, pantoprazole, prednisolone acetate, quetiapine, risperidone, tamsulosin, tramadol, and trazodone. His primarily concern today is the No chief complaint on file.  Initial Vital Signs:  Pulse/HCG Rate:    Temp:   Resp:   BP:   SpO2:    BMI: Estimated body mass index is 28.59 kg/m as calculated from the following:   Height as of 08/09/20: 5\' 11"  (1.803 m).   Weight as of 08/09/20: 205 lb (93 kg).  Risk Assessment: Allergies: Reviewed. He is allergic to azithromycin.  Allergy Precautions: None required Coagulopathies: Reviewed. None identified.  Blood-thinner therapy: None at this time Active Infection(s): Reviewed. None identified. Aaron Gibbs is afebrile  Site Confirmation: Aaron Gibbs was asked to confirm the procedure and laterality before marking the site Procedure checklist: Completed Consent: Before the procedure and under the influence of no sedative(s), amnesic(s), or anxiolytics, the patient was informed of the treatment options, risks and possible complications.  To fulfill our ethical and legal obligations, as recommended by the American Medical Association's Code of Ethics, I have informed the patient of my clinical impression; the nature and purpose of  the treatment or procedure; the risks, benefits, and possible complications of the intervention; the alternatives, including doing nothing; the risk(s) and benefit(s) of the alternative treatment(s) or procedure(s); and the risk(s) and benefit(s) of doing nothing. The patient was provided information about the general risks and possible complications associated with the procedure. These may include, but are not limited to: failure to achieve desired goals, infection, bleeding, organ or nerve damage, allergic reactions, paralysis, and death. In addition, the patient was informed of those risks and complications associated to the procedure, such as failure to decrease pain; infection; bleeding; organ or nerve damage with subsequent damage to sensory, motor, and/or autonomic systems, resulting in permanent pain, numbness, and/or weakness of one or several areas of the body; allergic reactions; (i.e.: anaphylactic reaction); and/or death. Furthermore, the patient was informed of those risks and complications associated with the medications. These include, but are not limited to: allergic reactions (i.e.: anaphylactic or anaphylactoid reaction(s)); adrenal axis suppression; blood sugar elevation that in diabetics may result in ketoacidosis or comma; water retention that in patients with history of congestive heart failure may result in shortness of breath, pulmonary edema, and decompensation with resultant heart failure; weight gain; swelling or edema; medication-induced neural toxicity; particulate matter embolism and blood vessel occlusion with resultant organ, and/or nervous system infarction; and/or aseptic necrosis of one or more joints. Finally, the patient was informed that Medicine is not an exact science; therefore,  there is also the possibility of unforeseen or unpredictable risks and/or possible complications that may result in a catastrophic outcome. The patient indicated having understood very clearly. We have given the patient no guarantees and we have made no promises. Enough time was given to the patient to ask questions, all of which were answered to the patient's satisfaction. Mr. Schoene has indicated that he wanted to continue with the procedure. Attestation: I, the ordering provider, attest that I have discussed with the patient the benefits, risks, side-effects, alternatives, likelihood of achieving goals, and potential problems during recovery for the procedure that I have provided informed consent. Date  Time: {CHL [facility]-PAIN TIME CHOICES:[id number]}  Pre-Procedure Preparation:  Monitoring: As per clinic protocol. Respiration, ETCO2, SpO2, BP, heart rate and rhythm monitor placed and checked for adequate function Safety Precautions: Patient was assessed for positional comfort and pressure points before starting the procedure. Time-out: I initiated and conducted the "Time-out" before starting the procedure, as per protocol. The patient was asked to participate by confirming the accuracy of the "Time Out" information. Verification of the correct person, site, and procedure were performed and confirmed by me, the nursing staff, and the patient. "Time-out" conducted as per Joint Commission's Universal Protocol (UP.01.01.01). Time:    Description of Procedure:          Target Area: For Genicular Nerve radiofrequency ablation (destruction), the targets are: the superolateral genicular nerve, located in the lateral distal portion of the femoral shaft as it curves to form the lateral epicondyle, in the region of the distal femoral metaphysis; the superomedial genicular nerve, located in the medial distal portion of the femoral shaft as it curves to form the medial epicondyle; and the inferomedial genicular  nerve, located in the medial, proximal portion of the tibial shaft, as it curves to form the medial epicondyle, in the region of the proximal tibial metaphysis. Approach: Anterior, ipsilateral approach. Area Prepped: Entire knee area, from mid-thigh to mid-shin, lateral, anterior, and medial aspects. DuraPrep (Iodine Povacrylex [0.7% available iodine] and Isopropyl Alcohol, 74% w/w) Safety Precautions: Aspiration  looking for blood return was conducted prior to all injections. At no point did we inject any substances, as a needle was being advanced. No attempts were made at seeking any paresthesias. Safe injection practices and needle disposal techniques used. Medications properly checked for expiration dates. SDV (single dose vial) medications used. Description of the Procedure: Protocol guidelines were followed. The patient was placed in position over the procedure table. The target area was identified and the area prepped in the usual manner. The skin and muscle were infiltrated with local anesthetic. Appropriate amount of time allowed to pass for local anesthetics to take effect. Radiofrequency needles were introduced to the target area using fluoroscopic guidance. Using the NeuroTherm NT1100 Radiofrequency Generator, sensory stimulation using 50 Hz was used to locate & identify the nerve, making sure that the needle was positioned such that there was no sensory stimulation below 0.3 V or above 0.7 V. Stimulation using 2 Hz was used to evaluate the motor component. Care was taken not to lesion any nerves that demonstrated motor stimulation of the lower extremities at an output of less than 2.5 times that of the sensory threshold, or a maximum of 2.0 V. Once satisfactory placement of the needles was achieved, the numbing solution was slowly injected after negative aspiration. After waiting for at least 2 minutes, the ablation was performed at 80 degrees C for 60 seconds, using regular Radiofrequency settings.  Once the procedure was completed, the needles were then removed and the area cleansed, making sure to leave some of the prepping solution back to take advantage of its long term bactericidal properties. Intra-operative Compliance: Compliant  There were no vitals filed for this visit.  Start Time:   hrs. End Time:   hrs. Materials & Medications:  Needle(s) Type: Teflon-coated, curved tip, Radiofrequency needle(s) Gauge: 22G Length: 10cm Medication(s): Please see orders for medications and dosing details.  Imaging Guidance (Non-Spinal):          Type of Imaging Technique: Fluoroscopy Guidance (Non-Spinal) Indication(s): Assistance in needle guidance and placement for procedures requiring needle placement in or near specific anatomical locations not easily accessible without such assistance. Exposure Time: Please see nurses notes. Contrast: Before injecting any contrast, we confirmed that the patient did not have an allergy to iodine, shellfish, or radiological contrast. Once satisfactory needle placement was completed at the desired level, radiological contrast was injected. Contrast injected under live fluoroscopy. No contrast complications. See chart for type and volume of contrast used. Fluoroscopic Guidance: I was personally present during the use of fluoroscopy. "Tunnel Vision Technique" used to obtain the best possible view of the target area. Parallax error corrected before commencing the procedure. "Direction-depth-direction" technique used to introduce the needle under continuous pulsed fluoroscopy. Once target was reached, antero-posterior, oblique, and lateral fluoroscopic projection used confirm needle placement in all planes. Images permanently stored in EMR. Interpretation: I personally interpreted the imaging intraoperatively. Adequate needle placement confirmed in multiple planes. Appropriate spread of contrast into desired area was observed. No evidence of afferent or efferent  intravascular uptake. Permanent images saved into the patient's record.  Antibiotic Prophylaxis:   Anti-infectives (From admission, onward)   None     Indication(s): None identified  Post-operative Assessment:  Post-procedure Vital Signs:  Pulse/HCG Rate:    Temp:   Resp:   BP:   SpO2:    EBL: None  Complications: No immediate post-treatment complications observed by team, or reported by patient.  Note: The patient tolerated the entire procedure well. A repeat set  of vitals were taken after the procedure and the patient was kept under observation following institutional policy, for this type of procedure. Post-procedural neurological assessment was performed, showing return to baseline, prior to discharge. The patient was provided with post-procedure discharge instructions, including a section on how to identify potential problems. Should any problems arise concerning this procedure, the patient was given instructions to immediately contact us, at any time, without hesitation. In any case, we plan to contact the patient by telephone for a follow-up status report regarding this interventional procedure.  Comments:  No additional relevant information.  Plan of Care  Orders:  No orders of the defined types were placed in this encounter.  Chronic Opioid Analgesic:  No opioid analgesics prescribed by our practice.  Buprenorphine 10 mcg/h patch q. 7 days.  High risk for SUD.  History of suicidal attempts and noncompliance with medication intake. ED visits thought to be due to medication binging. MME/day: 0 mg/day.   Medications ordered for procedure: No orders of the defined types were placed in this encounter.  Medications administered: Aaron Gibbs "Bill" had no medications administered during this visit.  See the medical record for exact dosing, route, and time of administration.  Follow-up plan:   No follow-ups on file.       Interventional Therapies  Risk  Complexity  Considerations:   Patient is legally blind. High risk for substance use disorder.  The patient has documented suicidal attempt using medications.  In addition, we have documented the patient to have a binge use pattern and been noncompliant with the instructions on how to use the medications.  This is a reason why went to a buprenorphine patch.   Planned  Pending:   Repeat bilateral genicular nerve RFA #3    Under consideration:   Palliative bilateral genicular nerve RFA maintenance treatments   Completed:   Palliative bilateral IA Hyalgan knee injections Sx2  Diagnostic bilateral genicular NB x1  Palliative right genicular nerve RFA x2 (04/06/2020)  Palliative left genicular nerve RFA x2 (03/23/2020)    Therapeutic  Palliative (PRN) options:   Palliative bilateral IA Hyalgan knee injections S3/N1  Diagnostic bilateral genicular NB #2  Palliative bilateral genicular nerve RFA #3      Recent Visits Date Type Provider Dept  08/09/20 Office Visit Milinda Pointer, MD [facility]  Showing recent visits within past 90 days and meeting all other requirements Future Appointments Date Type Provider Dept  09/02/20 Appointment Milinda Pointer, MD [facility]  Showing future appointments within next 90 days and meeting all other requirements  Disposition: Discharge home  Discharge (Date  Time): 09/02/2020;   hrs.   Primary Care Physician: Olin Hauser, DO Location: [facility] Note by: Gaspar Cola, MD Date: 09/02/2020; Time: 8:52 AM  Disclaimer:  Medicine is not an Chief Strategy Officer. The only guarantee in medicine is that nothing is guaranteed. It is important to note that the decision to proceed with this intervention was based on the information collected from the patient. The Data and conclusions were drawn from the patient's questionnaire, the interview, and the physical examination. Because the information was  provided in large part by the patient, it cannot be guaranteed that it has not been purposely or unconsciously manipulated. Every effort has been made to obtain as much relevant data as possible for this evaluation. It is important to note that the conclusions that lead to this procedure are derived in large part from the available data.  Always take into account that the treatment will also be dependent on availability of resources and existing treatment guidelines, considered by other Pain Management Practitioners as being common knowledge and practice, at the time of the intervention. For Medico-Legal purposes, it is also important to point out that variation in procedural techniques and pharmacological choices are the acceptable norm. The indications, contraindications, technique, and results of the above procedure should only be interpreted and judged by a Board-Certified Interventional Pain Specialist with extensive familiarity and expertise in the same exact procedure and technique.

## 2020-08-31 ENCOUNTER — Other Ambulatory Visit: Payer: Self-pay

## 2020-08-31 ENCOUNTER — Encounter: Payer: Self-pay | Admitting: Family Medicine

## 2020-08-31 ENCOUNTER — Ambulatory Visit (INDEPENDENT_AMBULATORY_CARE_PROVIDER_SITE_OTHER): Payer: Medicare Other | Admitting: Family Medicine

## 2020-08-31 VITALS — Ht 70.0 in | Wt 210.0 lb

## 2020-08-31 DIAGNOSIS — J441 Chronic obstructive pulmonary disease with (acute) exacerbation: Secondary | ICD-10-CM | POA: Diagnosis not present

## 2020-08-31 MED ORDER — PREDNISONE 20 MG PO TABS: ORAL_TABLET | ORAL | 0 refills | Status: DC

## 2020-08-31 MED ORDER — LEVOFLOXACIN 500 MG PO TABS: 500.0000 mg | ORAL_TABLET | Freq: Every day | ORAL | 0 refills | Status: DC

## 2020-08-31 NOTE — Progress Notes (Signed)
Virtual Visit via Telephone The purpose of this virtual visit is to provide medical care while limiting exposure to the novel coronavirus (COVID19) for both patient and office staff.  Consent was obtained for phone visit:  Yes.   Answered questions that patient had about telehealth interaction:  Yes.   I discussed the limitations, risks, security and privacy concerns of performing an evaluation and management service by telephone. I also discussed with the patient that there may be a patient responsible charge related to this service. The patient expressed understanding and agreed to proceed.  Patient Location: Home Provider Location: [facility] (Office)  Participants in virtual visit: - Patient: Aaron Gibbs - CMA: Orinda Kenner, CMA - Provider: Dr Parks Ranger  ---------------------------------------------------------------------- Chief Complaint  Patient presents with  . Sinusitis  . Shortness of Breath    S: Reviewed CMA documentation. I have called patient and gathered additional HPI as follows:  Acute COPD Exacerbation Reports that symptoms started >2 weeks ago without improvement, worsening sinusitis, then worsening lower respiratory symptoms productive cough. Failed Augmentin 07/25/20 now 6 weeks later  Denies any high risk travel to areas of current concern for COVID19. Denies any known or suspected exposure to person with or possibly with COVID19.  Denies any fevers, chills, sweats, body ache, sinus pain or pressure, headache, abdominal pain, diarrhea  Past Medical History:  Diagnosis Date  . Anxiety   . Cancer ([place])    skin  . COPD (chronic obstructive pulmonary disease) ([place])   . Glaucoma   . Osteoporosis    osteopenia   Social History   Tobacco Use  . Smoking status: Former Smoker    Years: 20.00  . Smokeless tobacco: Current User    Types: Chew  . Tobacco comment: stopped 15 years ago  Vaping Use  . Vaping Use: Never used   Substance Use Topics  . Alcohol use: Not Currently    Comment: past  . Drug use: Never    Current Outpatient Medications:  .  albuterol (VENTOLIN HFA) 108 (90 Base) MCG/ACT inhaler, INHALE 1-2 PUFFS INTO THE LUNGS EVERY 6 HOURS AS NEEDED, Disp: 8.5 g, Rfl: 3 .  amLODipine (NORVASC) 10 MG tablet, TAKE 1 TABLET BY MOUTH ONCE DAILY, Disp: 90 tablet, Rfl: 0 .  buprenorphine (BUTRANS) 10 MCG/HR PTWK, Place 1 patch onto the skin once a week for 28 days. Apply only 1 patch at a time and alternate sites weekly., Disp: 4 patch, Rfl: 0 .  [START ON 09/08/2020] buprenorphine (BUTRANS) 10 MCG/HR PTWK, Place 1 patch onto the skin once a week for 28 days. Apply only 1 patch at a time and alternate sites weekly., Disp: 4 patch, Rfl: 0 .  [START ON 10/06/2020] buprenorphine (BUTRANS) 10 MCG/HR PTWK, Place 1 patch onto the skin once a week for 28 days. Apply only 1 patch at a time and alternate sites weekly., Disp: 4 patch, Rfl: 0 .  COSOPT 22.3-6.8 MG/ML ophthalmic solution, 1 drop 2 (two) times daily., Disp: , Rfl:  .  diclofenac sodium (VOLTAREN) 1 % GEL, Apply 2 g topically daily as needed. , Disp: , Rfl:  .  DULoxetine (CYMBALTA) 20 MG capsule, TAKE 4 CAPSULES BY MOUTH ONCE DAILY, Disp: 120 capsule, Rfl: 0 .  fenofibrate (TRICOR) 145 MG tablet, Take 1 tablet (145 mg total) by mouth daily., Disp: 90 tablet, Rfl: 1 .  ferrous sulfate 325 (65 FE) MG tablet, Take 325 mg by mouth daily with breakfast., Disp: , Rfl:  .  fluticasone (FLONASE) 50 MCG/ACT nasal spray, Place 2 sprays into both nostrils daily. Use for 4-6 weeks then stop and use seasonally or as needed., Disp: 16 g, Rfl: 3 .  hydrOXYzine (ATARAX/VISTARIL) 25 MG tablet, TAKE 1 TABLET BY MOUTH 4 TIMES DAILY, Disp: 120 tablet, Rfl: 2 .  levofloxacin (LEVAQUIN) 500 MG tablet, Take 1 tablet (500 mg total) by mouth daily. For 7 days, Disp: 7 tablet, Rfl: 0 .  LORazepam (ATIVAN) 0.5 MG tablet, TAKE 1 TABLET BY MOUTH EVERY 6 HOURS AS NEEDED ANXIETY, Disp: 90  tablet, Rfl: 0 .  Multiple Vitamin (MULTIVITAMIN WITH MINERALS) TABS tablet, Take 1 tablet by mouth daily., Disp: , Rfl:  .  mupirocin cream (BACTROBAN) 2 %, Apply 1 application topically 2 (two) times daily. For 1-2 weeks as needed, skin sore, Disp: 15 g, Rfl: 2 .  pantoprazole (PROTONIX) 40 MG tablet, Take 1 tablet (40 mg total) by mouth daily., Disp: 90 tablet, Rfl: 3 .  prednisoLONE acetate (PRED FORTE) 1 % ophthalmic suspension, Place 1 drop into the left eye as needed., Disp: , Rfl:  .  predniSONE (DELTASONE) 20 MG tablet, Take daily with food. Start with 60mg  (3 pills) x 2 days, then reduce to 40mg  (2 pills) x 2 days, then 20mg  (1 pill) x 3 days, Disp: 13 tablet, Rfl: 0 .  QUEtiapine (SEROQUEL) 100 MG tablet, TAKE 1 TABLET BY MOUTH AT BEDTIME, Disp: 90 tablet, Rfl: 1 .  risperiDONE (RISPERDAL) 0.5 MG tablet, TAKE 1 TABLET BY MOUTH TWICE DAILY, Disp: 180 tablet, Rfl: 0 .  tamsulosin (FLOMAX) 0.4 MG CAPS capsule, Take 1 capsule (0.4 mg total) by mouth daily after supper., Disp: 30 capsule, Rfl: 2 .  traMADol (ULTRAM) 50 MG tablet, Take 50 mg by mouth 3 (three) times daily as needed., Disp: , Rfl:  .  traZODone (DESYREL) 100 MG tablet, Take 1 tablet (100 mg total) by mouth at bedtime., Disp: 90 tablet, Rfl: 1  Depression screen [facility] 2/9 08/09/2020 07/27/2020 04/15/2020  Decreased Interest 0 0 0  Down, Depressed, Hopeless 0 0 0  PHQ - 2 Score 0 0 0  Altered sleeping - - -  Tired, decreased energy - - -  Change in appetite - - -  Feeling bad or failure about yourself  - - -  Trouble concentrating - - -  Moving slowly or fidgety/restless - - -  Suicidal thoughts - - -  PHQ-9 Score - - -  Difficult doing work/chores - - -  Some recent data might be hidden    GAD 7 : Generalized Anxiety Score 03/05/2020 05/27/2019 04/11/2019 07/12/2018  Nervous, Anxious, on Edge 3 2 (No Data) 1  Control/stop worrying 2 2 - 1  Worry too much - different things 2 2 - 1  Trouble relaxing 2 2 - 1  Restless 2 1 -  0  Easily annoyed or irritable 1 1 - 0  Afraid - awful might happen 3 0 - 1  Total GAD 7 Score 15 10 - 5  Anxiety Difficulty Somewhat difficult Somewhat difficult - Somewhat difficult    -------------------------------------------------------------------------- O: No physical exam performed due to remote telephone encounter.  Lab results reviewed.  Recent Results (from the past 2160 hour(s))  POCT Urinalysis Dipstick     Status: Abnormal   Collection Time: 07/12/20  1:48 PM  Result Value Ref Range   Color, UA Yellow    Clarity, UA Clear    Glucose, UA Negative Negative   Bilirubin, UA Negative  Ketones, UA Negative    Spec Grav, UA 1.020 1.010 - 1.025   Blood, UA Moderate    pH, UA 6.0 5.0 - 8.0   Protein, UA Positive (A) Negative   Urobilinogen, UA 0.2 0.2 or 1.0 E.U./dL   Nitrite, UA Negative    Leukocytes, UA Negative Negative   Appearance Clear    Odor Negative   Urine Culture     Status: None   Collection Time: 07/12/20  1:59 PM   Specimen: Urine  Result Value Ref Range   MICRO NUMBER: [id number]    SPECIMEN QUALITY: Adequate    Sample Source NOT GIVEN    STATUS: FINAL    Result: No Growth     -------------------------------------------------------------------------- A&P:  Problem List Items Addressed This Visit   None   Visit Diagnoses    COPD with acute exacerbation (HCC)    -  Primary   Relevant Medications   levofloxacin (LEVAQUIN) 500 MG tablet   predniSONE (DELTASONE) 20 MG tablet     Consistent with mild acute exacerbation of COPD with worsening productive cough. Similar to prior exacerbations, recently on Augmentin without relief 07/25/20 for sinusitis. Mild reduced oxygen on last lab check  Plan: 1. Start Levaquin 500mg  daily x 7 days and Prednisone burst 7 day taper 2. Use albuterol q 4 hr regularly x 2-3 days. Continue maintenance inhalers RTC about 1 week if not improving, otherwise strict return criteria to go to ED   Meds ordered this  encounter  Medications  . levofloxacin (LEVAQUIN) 500 MG tablet    Sig: Take 1 tablet (500 mg total) by mouth daily. For 7 days    Dispense:  7 tablet    Refill:  0  . predniSONE (DELTASONE) 20 MG tablet    Sig: Take daily with food. Start with 60mg  (3 pills) x 2 days, then reduce to 40mg  (2 pills) x 2 days, then 20mg  (1 pill) x 3 days    Dispense:  13 tablet    Refill:  0    Follow-up: - Return if not improved  Patient verbalizes understanding with the above medical recommendations including the limitation of remote medical advice.  Specific follow-up and call-back criteria were given for patient to follow-up or seek medical care more urgently if needed.   - Time spent in direct consultation with patient on phone: 7 minutes  [name], [facility] 08/31/2020, 4:26 PM

## 2020-09-02 ENCOUNTER — Other Ambulatory Visit: Payer: Self-pay | Admitting: Family Medicine

## 2020-09-02 ENCOUNTER — Ambulatory Visit: Payer: Medicare Other | Admitting: Pain Medicine

## 2020-09-02 DIAGNOSIS — F411 Generalized anxiety disorder: Secondary | ICD-10-CM

## 2020-09-02 DIAGNOSIS — F410 Panic disorder [episodic paroxysmal anxiety] without agoraphobia: Secondary | ICD-10-CM

## 2020-09-02 NOTE — Telephone Encounter (Signed)
Requested medication (s) are due for refill today: yes  Requested medication (s) are on the active medication list:  yes  Last refill:  07/26/2020  Future visit scheduled:  no  Notes to clinic:  this refill cannot be delegated    Requested Prescriptions  Pending Prescriptions Disp Refills   LORazepam (ATIVAN) 0.5 MG tablet [Pharmacy Med Name: LORAZEPAM 0.5 MG TAB] 90 tablet     Sig: TAKE 1 TABLET BY MOUTH EVERY 6 HOURS AS NEEDED ANXIETY      Not Delegated - Psychiatry:  Anxiolytics/Hypnotics Failed - 09/02/2020  1:54 PM      Failed - This refill cannot be delegated      Passed - Urine Drug Screen completed in last 360 days      Passed - Valid encounter within last 6 months    Recent Outpatient Visits           2 days ago COPD with acute exacerbation [facility])   [place], DO   1 month ago Upper respiratory tract infection, unspecified type   [place], NP   1 month ago Recurrent UTI   [place], DO   6 months ago Chronic pain syndrome   [place], DO   6 months ago Recurrent UTI   [place], Devonne Doughty, DO       Future Appointments             In 38 months [facility], [facility]

## 2020-09-06 ENCOUNTER — Other Ambulatory Visit: Payer: Self-pay | Admitting: Family Medicine

## 2020-09-06 DIAGNOSIS — F333 Major depressive disorder, recurrent, severe with psychotic symptoms: Secondary | ICD-10-CM

## 2020-09-09 NOTE — Progress Notes (Deleted)
PROVIDER NOTE: Information contained herein reflects review and annotations entered in association with encounter. Interpretation of such information and data should be left to medically-trained personnel. Information provided to patient can be located elsewhere in the medical record under "Patient Instructions". Document created using STT-dictation technology, any transcriptional errors that may result from process are unintentional.    Patient: Aaron Gibbs  Service Category: Procedure  Provider: Gaspar Cola, MD  DOB: 01-23-1935  DOS: 09/14/2020  Location: [facility]  MRN: [id number]  Setting: Ambulatory - outpatient  Referring Provider: Milinda Pointer, MD  Type: Established Patient  Specialty: Interventional Pain Management  PCP: Olin Hauser, DO   Primary Reason for Visit: Interventional Pain Management Treatment. CC: No chief complaint on file.  Procedure:          Anesthesia, Analgesia, Anxiolysis:  Type: Therapeutic Superolateral, Superomedial, and Inferomedial, Genicular Nerve Radiofrequency Ablation (destruction). #3  Region: Lateral, Anterior, and Medial aspects of the knee joint, above and below the knee joint proper. Level: Superior and inferior to the knee joint. Laterality: Left  Type: Moderate (Conscious) Sedation combined with Local Anesthesia Indication(s): Analgesia and Anxiety Route: Intravenous (IV) IV Access: Secured Sedation: Meaningful verbal contact was maintained at all times during the procedure  Local Anesthetic: Lidocaine 1-2%  Position: Supine   Indications: 1. Chronic knee pain (1ry area of Pain) (Bilateral) (L>R)   2. Osteoarthritis of knee (Left)   3. Tricompartment osteoarthritis of knee (Left)   4. Lateral meniscal tear, sequela (Left)   5. Medial meniscal tear, sequela (Left)   6. Abnormal MRI, knee (08/05/2018) (Left)    Aaron Gibbs has been dealing with the above chronic pain for longer than three months  and has either failed to respond, was unable to tolerate, or simply did not get enough benefit from other more conservative therapies including, but not limited to: 1. Over-the-counter medications 2. Anti-inflammatory medications 3. Muscle relaxants 4. Membrane stabilizers 5. Opioids 6. Physical therapy and/or chiropractic manipulation 7. Modalities (Heat, ice, etc.) 8. Invasive techniques such as nerve blocks. Aaron Gibbs has attained more than 50% relief of the pain from a series of diagnostic injections conducted in separate occasions.  Pain Score: Pre-procedure:  /10 Post-procedure:  /10  Pre-op H&P Assessment:  Aaron Gibbs is a 86 y.o. (year old), male patient, seen today for interventional treatment. He  has a past surgical history that includes Cholecystectomy and Transurethral resection of prostate (N/A, 2008). [name] has a current medication list which includes the following prescription(s): albuterol, amlodipine, buprenorphine, buprenorphine, [START ON 10/06/2020] buprenorphine, cosopt, diclofenac sodium, duloxetine, fenofibrate, ferrous sulfate, fluticasone, hydroxyzine, levofloxacin, lorazepam, multivitamin with minerals, mupirocin cream, pantoprazole, prednisolone acetate, prednisone, quetiapine, risperidone, tamsulosin, tramadol, and trazodone. His primarily concern today is the No chief complaint on file.  Initial Vital Signs:  Pulse/HCG Rate:    Temp:   Resp:   BP:   SpO2:    BMI: Estimated body mass index is 30.13 kg/m as calculated from the following:   Height as of 08/31/20: 5\' 10"  (1.778 m).   Weight as of 08/31/20: 210 lb (95.3 kg).  Risk Assessment: Allergies: Reviewed. He is allergic to azithromycin.  Allergy Precautions: None required Coagulopathies: Reviewed. None identified.  Blood-thinner therapy: None at this time Active Infection(s): Reviewed. None identified. Aaron Gibbs is afebrile  Site Confirmation: Aaron Gibbs was asked to confirm the  procedure and laterality before marking the site Procedure checklist: Completed Consent: Before the procedure and under the influence of no sedative(s),  amnesic(s), or anxiolytics, the patient was informed of the treatment options, risks and possible complications. To fulfill our ethical and legal obligations, as recommended by the American Medical Association's Code of Ethics, I have informed the patient of my clinical impression; the nature and purpose of the treatment or procedure; the risks, benefits, and possible complications of the intervention; the alternatives, including doing nothing; the risk(s) and benefit(s) of the alternative treatment(s) or procedure(s); and the risk(s) and benefit(s) of doing nothing. The patient was provided information about the general risks and possible complications associated with the procedure. These may include, but are not limited to: failure to achieve desired goals, infection, bleeding, organ or nerve damage, allergic reactions, paralysis, and death. In addition, the patient was informed of those risks and complications associated to the procedure, such as failure to decrease pain; infection; bleeding; organ or nerve damage with subsequent damage to sensory, motor, and/or autonomic systems, resulting in permanent pain, numbness, and/or weakness of one or several areas of the body; allergic reactions; (i.e.: anaphylactic reaction); and/or death. Furthermore, the patient was informed of those risks and complications associated with the medications. These include, but are not limited to: allergic reactions (i.e.: anaphylactic or anaphylactoid reaction(s)); adrenal axis suppression; blood sugar elevation that in diabetics may result in ketoacidosis or comma; water retention that in patients with history of congestive heart failure may result in shortness of breath, pulmonary edema, and decompensation with resultant heart failure; weight gain; swelling or edema;  medication-induced neural toxicity; particulate matter embolism and blood vessel occlusion with resultant organ, and/or nervous system infarction; and/or aseptic necrosis of one or more joints. Finally, the patient was informed that Medicine is not an exact science; therefore, there is also the possibility of unforeseen or unpredictable risks and/or possible complications that may result in a catastrophic outcome. The patient indicated having understood very clearly. We have given the patient no guarantees and we have made no promises. Enough time was given to the patient to ask questions, all of which were answered to the patient's satisfaction. Mr. Varelas has indicated that he wanted to continue with the procedure. Attestation: I, the ordering provider, attest that I have discussed with the patient the benefits, risks, side-effects, alternatives, likelihood of achieving goals, and potential problems during recovery for the procedure that I have provided informed consent. Date  Time: {CHL [facility]-PAIN TIME CHOICES:[id number]}  Pre-Procedure Preparation:  Monitoring: As per clinic protocol. Respiration, ETCO2, SpO2, BP, heart rate and rhythm monitor placed and checked for adequate function Safety Precautions: Patient was assessed for positional comfort and pressure points before starting the procedure. Time-out: I initiated and conducted the "Time-out" before starting the procedure, as per protocol. The patient was asked to participate by confirming the accuracy of the "Time Out" information. Verification of the correct person, site, and procedure were performed and confirmed by me, the nursing staff, and the patient. "Time-out" conducted as per Joint Commission's Universal Protocol (UP.01.01.01). Time:    Description of Procedure:          Target Area: For Genicular Nerve radiofrequency ablation (destruction), the targets are: the superolateral genicular nerve, located in the lateral distal portion of the  femoral shaft as it curves to form the lateral epicondyle, in the region of the distal femoral metaphysis; the superomedial genicular nerve, located in the medial distal portion of the femoral shaft as it curves to form the medial epicondyle; and the inferomedial genicular nerve, located in the medial, proximal portion of the tibial shaft, as it  curves to form the medial epicondyle, in the region of the proximal tibial metaphysis. Approach: Anterior, ipsilateral approach. Area Prepped: Entire knee area, from mid-thigh to mid-shin, lateral, anterior, and medial aspects. DuraPrep (Iodine Povacrylex [0.7% available iodine] and Isopropyl Alcohol, 74% w/w) Safety Precautions: Aspiration looking for blood return was conducted prior to all injections. At no point did we inject any substances, as a needle was being advanced. No attempts were made at seeking any paresthesias. Safe injection practices and needle disposal techniques used. Medications properly checked for expiration dates. SDV (single dose vial) medications used. Description of the Procedure: Protocol guidelines were followed. The patient was placed in position over the procedure table. The target area was identified and the area prepped in the usual manner. The skin and muscle were infiltrated with local anesthetic. Appropriate amount of time allowed to pass for local anesthetics to take effect. Radiofrequency needles were introduced to the target area using fluoroscopic guidance. Using the NeuroTherm NT1100 Radiofrequency Generator, sensory stimulation using 50 Hz was used to locate & identify the nerve, making sure that the needle was positioned such that there was no sensory stimulation below 0.3 V or above 0.7 V. Stimulation using 2 Hz was used to evaluate the motor component. Care was taken not to lesion any nerves that demonstrated motor stimulation of the lower extremities at an output of less than 2.5 times that of the sensory threshold, or a  maximum of 2.0 V. Once satisfactory placement of the needles was achieved, the numbing solution was slowly injected after negative aspiration. After waiting for at least 2 minutes, the ablation was performed at 80 degrees C for 60 seconds, using regular Radiofrequency settings. Once the procedure was completed, the needles were then removed and the area cleansed, making sure to leave some of the prepping solution back to take advantage of its long term bactericidal properties. Intra-operative Compliance: Compliant  There were no vitals filed for this visit.  Start Time:   hrs. End Time:   hrs. Materials & Medications:  Needle(s) Type: Teflon-coated, curved tip, Radiofrequency needle(s) Gauge: 22G Length: 10cm Medication(s): Please see orders for medications and dosing details.  Imaging Guidance (Non-Spinal):          Type of Imaging Technique: Fluoroscopy Guidance (Non-Spinal) Indication(s): Assistance in needle guidance and placement for procedures requiring needle placement in or near specific anatomical locations not easily accessible without such assistance. Exposure Time: Please see nurses notes. Contrast: Before injecting any contrast, we confirmed that the patient did not have an allergy to iodine, shellfish, or radiological contrast. Once satisfactory needle placement was completed at the desired level, radiological contrast was injected. Contrast injected under live fluoroscopy. No contrast complications. See chart for type and volume of contrast used. Fluoroscopic Guidance: I was personally present during the use of fluoroscopy. "Tunnel Vision Technique" used to obtain the best possible view of the target area. Parallax error corrected before commencing the procedure. "Direction-depth-direction" technique used to introduce the needle under continuous pulsed fluoroscopy. Once target was reached, antero-posterior, oblique, and lateral fluoroscopic projection used confirm needle placement in  all planes. Images permanently stored in EMR. Interpretation: I personally interpreted the imaging intraoperatively. Adequate needle placement confirmed in multiple planes. Appropriate spread of contrast into desired area was observed. No evidence of afferent or efferent intravascular uptake. Permanent images saved into the patient's record.  Antibiotic Prophylaxis:   Anti-infectives (From admission, onward)   None     Indication(s): None identified  Post-operative Assessment:  Post-procedure Vital Signs:  Pulse/HCG Rate:    Temp:   Resp:   BP:   SpO2:    EBL: None  Complications: No immediate post-treatment complications observed by team, or reported by patient.  Note: The patient tolerated the entire procedure well. A repeat set of vitals were taken after the procedure and the patient was kept under observation following institutional policy, for this type of procedure. Post-procedural neurological assessment was performed, showing return to baseline, prior to discharge. The patient was provided with post-procedure discharge instructions, including a section on how to identify potential problems. Should any problems arise concerning this procedure, the patient was given instructions to immediately contact us, at any time, without hesitation. In any case, we plan to contact the patient by telephone for a follow-up status report regarding this interventional procedure.  Comments:  No additional relevant information.  Plan of Care  Orders:  No orders of the defined types were placed in this encounter.  Chronic Opioid Analgesic:  No opioid analgesics prescribed by our practice.  Buprenorphine 10 mcg/h patch q. 7 days.  High risk for SUD.  History of suicidal attempts and noncompliance with medication intake. ED visits thought to be due to medication binging. MME/day: 0 mg/day.   Medications ordered for procedure: No orders of the defined types were placed in this  encounter.  Medications administered: Aaron Gibbs "Bill" had no medications administered during this visit.  See the medical record for exact dosing, route, and time of administration.  Follow-up plan:   No follow-ups on file.      Interventional Therapies  Risk  Complexity Considerations:   Patient is legally blind. High risk for substance use disorder.  The patient has documented suicidal attempt using medications.  In addition, we have documented the patient to have a binge use pattern and been noncompliant with the instructions on how to use the medications.  This is a reason why went to a buprenorphine patch.   Planned  Pending:   Repeat bilateral genicular nerve RFA #3    Under consideration:   Palliative bilateral genicular nerve RFA maintenance treatments   Completed:   Palliative bilateral IA Hyalgan knee injections Sx2  Diagnostic bilateral genicular NB x1  Palliative right genicular nerve RFA x2 (04/06/2020)  Palliative left genicular nerve RFA x2 (03/23/2020)    Therapeutic  Palliative (PRN) options:   Palliative bilateral IA Hyalgan knee injections S3/N1  Diagnostic bilateral genicular NB #2  Palliative bilateral genicular nerve RFA #3     Recent Visits Date Type Provider Dept  08/09/20 Office Visit Milinda Pointer, MD [facility]  Showing recent visits within past 90 days and meeting all other requirements Future Appointments Date Type Provider Dept  09/14/20 Appointment Milinda Pointer, MD [facility]  Showing future appointments within next 90 days and meeting all other requirements  Disposition: Discharge home  Discharge (Date  Time): 09/14/2020;   hrs.   Primary Care Physician: Olin Hauser, DO Location: [facility] Note by: Gaspar Cola, MD Date: 09/14/2020; Time: 7:42 AM  Disclaimer:  Medicine is not an Chief Strategy Officer. The only guarantee in medicine is that nothing is  guaranteed. It is important to note that the decision to proceed with this intervention was based on the information collected from the patient. The Data and conclusions were drawn from the patient's questionnaire, the interview, and the physical examination. Because the information was provided in large part by the patient, it cannot be guaranteed that it has not  been purposely or unconsciously manipulated. Every effort has been made to obtain as much relevant data as possible for this evaluation. It is important to note that the conclusions that lead to this procedure are derived in large part from the available data. Always take into account that the treatment will also be dependent on availability of resources and existing treatment guidelines, considered by other Pain Management Practitioners as being common knowledge and practice, at the time of the intervention. For Medico-Legal purposes, it is also important to point out that variation in procedural techniques and pharmacological choices are the acceptable norm. The indications, contraindications, technique, and results of the above procedure should only be interpreted and judged by a Board-Certified Interventional Pain Specialist with extensive familiarity and expertise in the same exact procedure and technique.

## 2020-09-13 DIAGNOSIS — R936 Abnormal findings on diagnostic imaging of limbs: Secondary | ICD-10-CM | POA: Insufficient documentation

## 2020-09-14 ENCOUNTER — Ambulatory Visit: Payer: Medicare Other | Admitting: Pain Medicine

## 2020-09-17 ENCOUNTER — Other Ambulatory Visit: Payer: Self-pay

## 2020-09-17 ENCOUNTER — Telehealth (INDEPENDENT_AMBULATORY_CARE_PROVIDER_SITE_OTHER): Payer: Medicare Other | Admitting: Family Medicine

## 2020-09-17 ENCOUNTER — Encounter: Payer: Self-pay | Admitting: Family Medicine

## 2020-09-17 VITALS — Ht 70.0 in | Wt 210.0 lb

## 2020-09-17 DIAGNOSIS — J3089 Other allergic rhinitis: Secondary | ICD-10-CM

## 2020-09-17 DIAGNOSIS — F333 Major depressive disorder, recurrent, severe with psychotic symptoms: Secondary | ICD-10-CM

## 2020-09-17 MED ORDER — QUETIAPINE FUMARATE 200 MG PO TABS: 200.0000 mg | ORAL_TABLET | Freq: Every day | ORAL | 1 refills | Status: DC

## 2020-09-17 MED ORDER — AZELASTINE HCL 137 MCG/SPRAY NA SOLN: 1.0000 | Freq: Every day | NASAL | 2 refills | Status: DC

## 2020-09-17 MED ORDER — MONTELUKAST SODIUM 10 MG PO TABS: 10.0000 mg | ORAL_TABLET | Freq: Every day | ORAL | 3 refills | Status: DC

## 2020-09-17 NOTE — Progress Notes (Signed)
Virtual Visit via Telephone The purpose of this virtual visit is to provide medical care while limiting exposure to the novel coronavirus (COVID19) for both patient and office staff.  Consent was obtained for phone visit:  Yes.   Answered questions that patient had about telehealth interaction:  Yes.   I discussed the limitations, risks, security and privacy concerns of performing an evaluation and management service by telephone. I also discussed with the patient that there may be a patient responsible charge related to this service. The patient expressed understanding and agreed to proceed.  Patient Location: Home Provider Location: [facility] (Office)  Participants in virtual visit: - Patient: Aaron Gibbs - CMA: Orinda Kenner, CMA - Provider: Dr Parks Ranger  ---------------------------------------------------------------------- Chief Complaint  Patient presents with  . Headache  . Cough  . Nasal Congestion    S: Reviewed CMA documentation. I have called patient and gathered additional HPI as follows:  Allergic Rhinosinusitis History of COPD Reports that symptoms started within past few days, with allergies nasal drainage and congestion, watery itchy symptoms. He uses Zyrtec 10mg  daily and Flonase, still has breakthrough symptoms.  Mood/Insomnia He requests dose increase Seroquel, previously on 100mg  nightly, out of med now, only partially effective.  Denies any known or suspected exposure to person with or possibly with COVID19.  Denies any fevers, chills, sweats, body ache, cough, shortness of breath, sinus pain or pressure, headache, abdominal pain, diarrhea  Past Medical History:  Diagnosis Date  . Anxiety   . Cancer ([place])    skin  . COPD (chronic obstructive pulmonary disease) ([place])   . Glaucoma   . Osteoporosis    osteopenia   Social History   Tobacco Use  . Smoking status: Former Smoker    Years: 20.00  . Smokeless tobacco:  Current User    Types: Chew  . Tobacco comment: stopped 15 years ago  Vaping Use  . Vaping Use: Never used  Substance Use Topics  . Alcohol use: Not Currently    Comment: past  . Drug use: Never    Current Outpatient Medications:  .  albuterol (VENTOLIN HFA) 108 (90 Base) MCG/ACT inhaler, INHALE 1-2 PUFFS INTO THE LUNGS EVERY 6 HOURS AS NEEDED, Disp: 8.5 g, Rfl: 3 .  amLODipine (NORVASC) 10 MG tablet, TAKE 1 TABLET BY MOUTH ONCE DAILY, Disp: 90 tablet, Rfl: 0 .  Azelastine HCl 137 MCG/SPRAY SOLN, Place 1 spray into the nose daily., Disp: 30 mL, Rfl: 2 .  buprenorphine (BUTRANS) 10 MCG/HR PTWK, Place 1 patch onto the skin once a week for 28 days. Apply only 1 patch at a time and alternate sites weekly., Disp: 4 patch, Rfl: 0 .  [START ON 10/06/2020] buprenorphine (BUTRANS) 10 MCG/HR PTWK, Place 1 patch onto the skin once a week for 28 days. Apply only 1 patch at a time and alternate sites weekly., Disp: 4 patch, Rfl: 0 .  COSOPT 22.3-6.8 MG/ML ophthalmic solution, 1 drop 2 (two) times daily., Disp: , Rfl:  .  diclofenac sodium (VOLTAREN) 1 % GEL, Apply 2 g topically daily as needed. , Disp: , Rfl:  .  DULoxetine (CYMBALTA) 20 MG capsule, TAKE 4 CAPSULES BY MOUTH ONCE DAILY, Disp: 120 capsule, Rfl: 0 .  fenofibrate (TRICOR) 145 MG tablet, Take 1 tablet (145 mg total) by mouth daily., Disp: 90 tablet, Rfl: 1 .  ferrous sulfate 325 (65 FE) MG tablet, Take 325 mg by mouth daily with breakfast., Disp: , Rfl:  .  fluticasone (  FLONASE) 50 MCG/ACT nasal spray, Place 2 sprays into both nostrils daily. Use for 4-6 weeks then stop and use seasonally or as needed., Disp: 16 g, Rfl: 3 .  hydrOXYzine (ATARAX/VISTARIL) 25 MG tablet, TAKE 1 TABLET BY MOUTH 4 TIMES DAILY, Disp: 120 tablet, Rfl: 2 .  LORazepam (ATIVAN) 0.5 MG tablet, TAKE 1 TABLET BY MOUTH EVERY 6 HOURS AS NEEDED ANXIETY, Disp: 90 tablet, Rfl: 2 .  montelukast (SINGULAIR) 10 MG tablet, Take 1 tablet (10 mg total) by mouth at bedtime., Disp: 90  tablet, Rfl: 3 .  Multiple Vitamin (MULTIVITAMIN WITH MINERALS) TABS tablet, Take 1 tablet by mouth daily., Disp: , Rfl:  .  mupirocin cream (BACTROBAN) 2 %, Apply 1 application topically 2 (two) times daily. For 1-2 weeks as needed, skin sore, Disp: 15 g, Rfl: 2 .  pantoprazole (PROTONIX) 40 MG tablet, Take 1 tablet (40 mg total) by mouth daily., Disp: 90 tablet, Rfl: 3 .  prednisoLONE acetate (PRED FORTE) 1 % ophthalmic suspension, Place 1 drop into the left eye as needed., Disp: , Rfl:  .  risperiDONE (RISPERDAL) 0.5 MG tablet, TAKE 1 TABLET BY MOUTH TWICE DAILY, Disp: 180 tablet, Rfl: 0 .  tamsulosin (FLOMAX) 0.4 MG CAPS capsule, Take 1 capsule (0.4 mg total) by mouth daily after supper., Disp: 30 capsule, Rfl: 2 .  traMADol (ULTRAM) 50 MG tablet, Take 50 mg by mouth 3 (three) times daily as needed., Disp: , Rfl:  .  traZODone (DESYREL) 100 MG tablet, TAKE 1 TABLET BY MOUTH AT BEDTIME, Disp: 90 tablet, Rfl: 1 .  buprenorphine (BUTRANS) 10 MCG/HR PTWK, Place 1 patch onto the skin once a week for 28 days. Apply only 1 patch at a time and alternate sites weekly., Disp: 4 patch, Rfl: 0 .  QUEtiapine (SEROQUEL) 200 MG tablet, Take 1 tablet (200 mg total) by mouth at bedtime., Disp: 90 tablet, Rfl: 1  Depression screen [facility] 2/9 08/09/2020 07/27/2020 04/15/2020  Decreased Interest 0 0 0  Down, Depressed, Hopeless 0 0 0  PHQ - 2 Score 0 0 0  Altered sleeping - - -  Tired, decreased energy - - -  Change in appetite - - -  Feeling bad or failure about yourself  - - -  Trouble concentrating - - -  Moving slowly or fidgety/restless - - -  Suicidal thoughts - - -  PHQ-9 Score - - -  Difficult doing work/chores - - -  Some recent data might be hidden    GAD 7 : Generalized Anxiety Score 03/05/2020 05/27/2019 04/11/2019 07/12/2018  Nervous, Anxious, on Edge 3 2 (No Data) 1  Control/stop worrying 2 2 - 1  Worry too much - different things 2 2 - 1  Trouble relaxing 2 2 - 1  Restless 2 1 - 0  Easily  annoyed or irritable 1 1 - 0  Afraid - awful might happen 3 0 - 1  Total GAD 7 Score 15 10 - 5  Anxiety Difficulty Somewhat difficult Somewhat difficult - Somewhat difficult    -------------------------------------------------------------------------- O: No physical exam performed due to remote telephone encounter.  Lab results reviewed.  Recent Results (from the past 2160 hour(s))  POCT Urinalysis Dipstick     Status: Abnormal   Collection Time: 07/12/20  1:48 PM  Result Value Ref Range   Color, UA Yellow    Clarity, UA Clear    Glucose, UA Negative Negative   Bilirubin, UA Negative    Ketones, UA Negative  Spec Grav, UA 1.020 1.010 - 1.025   Blood, UA Moderate    pH, UA 6.0 5.0 - 8.0   Protein, UA Positive (A) Negative   Urobilinogen, UA 0.2 0.2 or 1.0 E.U./dL   Nitrite, UA Negative    Leukocytes, UA Negative Negative   Appearance Clear    Odor Negative   Urine Culture     Status: None   Collection Time: 07/12/20  1:59 PM   Specimen: Urine  Result Value Ref Range   MICRO NUMBER: [id number]    SPECIMEN QUALITY: Adequate    Sample Source NOT GIVEN    STATUS: FINAL    Result: No Growth     -------------------------------------------------------------------------- A&P:  Problem List Items Addressed This Visit    Depression   Relevant Medications   QUEtiapine (SEROQUEL) 200 MG tablet    Other Visit Diagnoses    Seasonal allergic rhinitis due to other allergic trigger    -  Primary   Relevant Medications   montelukast (SINGULAIR) 10 MG tablet   Azelastine HCl 137 MCG/SPRAY SOLN     #Mood/Insomnia Improved overall Partial result on Seroquel 100mg , has been on other dose before, will inc to 200mg  nightly. Follow-up  #Allergic Rhinosinusitis No sign of bacterial infection based on history. Recent COPD flare ,now breathing stable Add Singulair 10mg  nightly Add Azelastine spray daily Continue Flonase, Zyrtec  Meds ordered this encounter  Medications  .  montelukast (SINGULAIR) 10 MG tablet    Sig: Take 1 tablet (10 mg total) by mouth at bedtime.    Dispense:  90 tablet    Refill:  3  . Azelastine HCl 137 MCG/SPRAY SOLN    Sig: Place 1 spray into the nose daily.    Dispense:  30 mL    Refill:  2  . QUEtiapine (SEROQUEL) 200 MG tablet    Sig: Take 1 tablet (200 mg total) by mouth at bedtime.    Dispense:  90 tablet    Refill:  1    Dose increase    Follow-up: PRN  Patient verbalizes understanding with the above medical recommendations including the limitation of remote medical advice.  Specific follow-up and call-back criteria were given for patient to follow-up or seek medical care more urgently if needed.   - Time spent in direct consultation with patient on phone: 7 minutes   [name], [facility] 09/17/2020, 10:06 AM

## 2020-09-28 ENCOUNTER — Ambulatory Visit
Admission: RE | Admit: 2020-09-28 | Discharge: 2020-09-28 | Disposition: A | Discharge status: Home / Self Care | Payer: Medicare Other | Source: Ambulatory Visit | Attending: Pain Medicine | Admitting: Pain Medicine

## 2020-09-28 ENCOUNTER — Ambulatory Visit (HOSPITAL_BASED_OUTPATIENT_CLINIC_OR_DEPARTMENT_OTHER): Payer: Medicare Other | Admitting: Pain Medicine

## 2020-09-28 ENCOUNTER — Other Ambulatory Visit: Payer: Self-pay

## 2020-09-28 ENCOUNTER — Encounter: Payer: Self-pay | Admitting: Pain Medicine

## 2020-09-28 VITALS — BP 105/65 | HR 71 | Temp 97.1°F | Resp 20 | Ht 70.0 in | Wt 210.0 lb

## 2020-09-28 DIAGNOSIS — M170 Bilateral primary osteoarthritis of knee: Secondary | ICD-10-CM | POA: Insufficient documentation

## 2020-09-28 DIAGNOSIS — S83242S Other tear of medial meniscus, current injury, left knee, sequela: Secondary | ICD-10-CM | POA: Insufficient documentation

## 2020-09-28 DIAGNOSIS — S83282S Other tear of lateral meniscus, current injury, left knee, sequela: Secondary | ICD-10-CM

## 2020-09-28 DIAGNOSIS — M25561 Pain in right knee: Secondary | ICD-10-CM | POA: Insufficient documentation

## 2020-09-28 DIAGNOSIS — G894 Chronic pain syndrome: Secondary | ICD-10-CM | POA: Diagnosis present

## 2020-09-28 DIAGNOSIS — G8929 Other chronic pain: Secondary | ICD-10-CM | POA: Diagnosis not present

## 2020-09-28 DIAGNOSIS — M25562 Pain in left knee: Secondary | ICD-10-CM

## 2020-09-28 DIAGNOSIS — M1712 Unilateral primary osteoarthritis, left knee: Secondary | ICD-10-CM | POA: Insufficient documentation

## 2020-09-28 MED ORDER — LIDOCAINE HCL 2 % IJ SOLN
20.0000 mL | Freq: Once | INTRAMUSCULAR | Status: AC
  Administered 2020-09-28: 400 mg
  Filled 2020-09-28: qty 20

## 2020-09-28 MED ORDER — METHYLPREDNISOLONE ACETATE 80 MG/ML IJ SUSP
80.0000 mg | Freq: Once | INTRAMUSCULAR | Status: AC
  Administered 2020-09-28: 80 mg
  Filled 2020-09-28: qty 1

## 2020-09-28 MED ORDER — BUPRENORPHINE 10 MCG/HR TD PTWK: 1.0000 | MEDICATED_PATCH | TRANSDERMAL | 0 refills | Status: DC

## 2020-09-28 MED ORDER — MIDAZOLAM HCL 5 MG/5ML IJ SOLN: 1.0000 mg | INTRAMUSCULAR | Status: DC | PRN

## 2020-09-28 MED ORDER — ROPIVACAINE HCL 2 MG/ML IJ SOLN
9.0000 mL | Freq: Once | INTRAMUSCULAR | Status: AC
Start: 2020-09-28 — End: 2020-09-28
  Administered 2020-09-28: 9 mL via PERINEURAL
  Filled 2020-09-28: qty 10

## 2020-09-28 MED ORDER — FENTANYL CITRATE (PF) 100 MCG/2ML IJ SOLN: 25.0000 ug | INTRAMUSCULAR | Status: DC | PRN

## 2020-09-28 MED ORDER — LACTATED RINGERS IV SOLN: 1000.0000 mL | Freq: Once | INTRAVENOUS | Status: DC

## 2020-09-28 NOTE — Patient Instructions (Addendum)
___________________________________________________________________________________________  Post-Radiofrequency (RF) Discharge Instructions  You have just completed a Radiofrequency Neurotomy.  The following instructions will provide you with information and guidelines for self-care upon discharge.  If at any time you have questions or concerns please call your physician. DO NOT DRIVE YOURSELF!!  Instructions:  Apply ice: Fill a plastic sandwich bag with crushed ice. Cover it with a small towel and apply to injection site. Apply for 15 minutes then remove x 15 minutes. Repeat sequence on day of procedure, until you go to bed. The purpose is to minimize swelling and discomfort after procedure.  Apply heat: Apply heat to procedure site starting the day following the procedure. The purpose is to treat any soreness and discomfort from the procedure.  Food intake: No eating limitations, unless stipulated above.  Nevertheless, if you have had sedation, you may experience some nausea.  In this case, it may be wise to wait at least two hours prior to resuming regular diet.  Physical activities: Keep activities to a minimum for the first 8 hours after the procedure. For the first 24 hours after the procedure, do not drive a motor vehicle,  Operate heavy machinery, power tools, or handle any weapons.  Consider walking with the use of an assistive device or accompanied by an adult for the first 24 hours.  Do not drink alcoholic beverages including beer.  Do not make any important decisions or sign any legal documents. Go home and rest today.  Resume activities tomorrow, as tolerated.  Use caution in moving about as you may experience mild leg weakness.  Use caution in cooking, use of household electrical appliances and climbing steps.  Driving: If you have received any sedation, you are not allowed to drive for 24 hours after your procedure.  Blood thinner: Restart your blood thinner 6 hours after your  procedure. (Only for those taking blood thinners)  Insulin: As soon as you can eat, you may resume your normal dosing schedule. (Only for those taking insulin)  Medications: May resume pre-procedure medications.  Do not take any drugs, other than what has been prescribed to you.  Infection prevention: Keep procedure site clean and dry.  Post-procedure Pain Diary: Extremely important that this be done correctly and accurately. Recorded information will be used to determine the next step in treatment.  Pain evaluated is that of treated area only. Do not include pain from an untreated area.  Complete every hour, on the hour, for the initial 8 hours. Set an alarm to help you do this part accurately.  Do not go to sleep and have it completed later. It will not be accurate.  Follow-up appointment: Keep your follow-up appointment after the procedure. Usually 2-6 weeks after radiofrequency. Bring you pain diary. The information collected will be essential for your long-term care.   Expect:  From numbing medicine (AKA: Local Anesthetics): Numbness or decrease in pain.  Onset: Full effect within 15 minutes of injected.  Duration: It will depend on the type of local anesthetic used. On the average, 1 to 8 hours.   From steroids (when added): Decrease in swelling or inflammation. Once inflammation is improved, relief of the pain will follow.  Onset of benefits: Depends on the amount of swelling present. The more swelling, the longer it will take for the benefits to be seen. In some cases, up to 10 days.  Duration: Steroids will stay in the system x 2 weeks. Duration of benefits will depend on multiple posibilities including persistent irritating factors.    From procedure: Some discomfort is to be expected once the numbing medicine wears off. In the case of radiofrequency procedures, this may last as long as 6 weeks. Additional post-procedure pain medication is provided for this. Discomfort is  minimized if ice and heat are applied as instructed.  Call if:  You experience numbness and weakness that gets worse with time, as opposed to wearing off.  He experience any unusual bleeding, difficulty breathing, or loss of the ability to control your bowel and bladder. (This applies to Spinal procedures only)  You experience any redness, swelling, heat, red streaks, elevated temperature, fever, or any other signs of a possible infection.  Emergency Numbers:  [place] business hours (Monday - Thursday, 8:00 AM - 4:00 PM) (Friday, 9:00 AM - 12:00 Noon): [phone]  After hours: [phone] ____________________________________________________________________________________________   ____________________________________________________________________________________________  Genicular Nerve Block  What is a genicular nerve block? A genicular nerve block is the injection of a local anesthetic to block the nerves that transmits pain from the knee.  What is the purpose of a facet nerve block? A genicular nerve block is a diagnostic procedure to determine if the pathologic changes (i.e. arthritis, meniscal tears, etc) and inflammation within the knee joint is the source of your knee pain. It also confirms that the knee pain will respond well to the actual treatment procedure. If a genicular nerve block works, it will give you relief for several hours. After that, the pain is expected to return to normal. This test is always performed twice (usually a week or two apart) because two successful tests are required to move onto treatment. If both diagnostic tests are positive, then we schedule a treatment called radiofrequency (RF) ablation. In this procedure, the same nerves are cauterized, which typically leads to pain relief for 4 -18 months. If this process works well for one knee, it can be performed on the other knee if needed.  How is the procedure performed? You will be placed on  the procedure table. The injection site is sterilized with either iodine or chlorhexadine. The site to be injected is numbed with a local anesthetic, and a needle is directed to the target area. X-ray guidance is used to ensure proper placement and positioning of the needle. When the needle is properly positioned near the genicular nerve, local anesthetic is injected to numb that nerve. This will be repeated at multiple sites around the knee to block all genicular nerves.  Will the procedure be painful? The injection can be painful and we therefore provide the option of receiving IV sedation. IV sedation, combined with local anesthetic, can make the injection nearly pain free. It allows you to remain very still during the procedure, which can also make the injection easier, faster, and more successful. If you decide to have IV sedation, you must have a driver to get you home safely afterwards. In addition, you cannot have anything to eat or drink within 8 hours of your appointment (clear liquids are allowed until 3 hours before the procedure). If you take medications for diabetes, these medications may need to be adjusted the morning of the procedure. Your primary care physician can help you with this adjustment.  What are the discharge instructions? If you received IV sedation do not drive or operate machinery for at least 24 hours after the procedure. You may return to work the next day following your procedure. You may resume your normal diet immediately. Do not engage in any strenuous activity for 24  hours. You should, however, engage in moderate activity that typically causes your ususal pain. If the block works, those activities should not be painful for several hours after the injection. Do not take a bath, swim, or use a hot tub for 24 hours (you may take a shower). Call the office if you have any of the following: severe pain afterwards (different than your usual symptoms), redness/swelling/discharge  at the injection site(s), fevers/chills, difficulty with bowel or bladder functions.  What are the risks and side effects? The complication rate for this procedure is very low. Whenever a needle enters the skin, bleeding or infection can occur. Some other serious but extremely rare risks include paralysis and death. You may have an allergic reaction to any of the medications used. If you have a known allergy to any medications, especially local anesthetics, notify our staff before the procedure takes place. You may experience any of the following side effects up to 4 - 6 hours after the procedure: . Leg muscle weakness or numbness may occur due to the local anesthetic affecting the nerves that control your legs (this is a temporary affect and it is not paralysis). If you have any leg weakness or numbness, walk only with assistance in order to prevent falls and injury. Your leg strength will return slowly and completely. . Dizziness may occur due to a decrease in your blood pressure. If this occurs, remain in a seated or lying position. Gradually sit up, and then stand after at least 10 minutes of sitting. . Mild headaches may occur. Drink fluids and take pain medications if needed. If the headaches persist or become severe, call the office. . Mild discomfort at the injection site can occur. This typically lasts for a few hours but can persist for a couple days. If this occurs, take anti-inflammatories or pain medications, apply ice to the area the day of the procedure. If it persists, apply moist heat in the day(s) following.  The side effects listed above can be normal. They are not dangerous and will resolve on their own. If, however, you experience any of the following, a complication may have occurred and you should either contact your doctor. If he is not readily available, then you should proceed to the closest urgent care center for evaluation: . Severe or progressive pain at the injection  site(s) . Arm or leg weakness that progressively worsens or persists for longer than 8 hours . Severe or progressive redness, swelling, or discharge from the injections site(s) . Fevers, chills, nausea, or vomiting . Bowel or bladder dysfunction (i.e. inability to urinate or pass stool or difficulty controlling either)  How long does it take for the procedure to work? You should feel relief from your usual pain within the first hour. Again, this is only expected to last for several hours, at the most. Remember, you may be sore in the middle part of your back from the needles, and you must distinguish this from your usual pain. ____________________________________________________________________________________________   ____________________________________________________________________________________________  Genicular Nerve Block  What is a genicular nerve block? A genicular nerve block is the injection of a local anesthetic to block the nerves that transmits pain from the knee.  What is the purpose of a facet nerve block? A genicular nerve block is a diagnostic procedure to determine if the pathologic changes (i.e. arthritis, meniscal tears, etc) and inflammation within the knee joint is the source of your knee pain. It also confirms that the knee pain will respond well to  the actual treatment procedure. If a genicular nerve block works, it will give you relief for several hours. After that, the pain is expected to return to normal. This test is always performed twice (usually a week or two apart) because two successful tests are required to move onto treatment. If both diagnostic tests are positive, then we schedule a treatment called radiofrequency (RF) ablation. In this procedure, the same nerves are cauterized, which typically leads to pain relief for 4 -18 months. If this process works well for one knee, it can be performed on the other knee if needed.  How is the procedure  performed? You will be placed on the procedure table. The injection site is sterilized with either iodine or chlorhexadine. The site to be injected is numbed with a local anesthetic, and a needle is directed to the target area. X-ray guidance is used to ensure proper placement and positioning of the needle. When the needle is properly positioned near the genicular nerve, local anesthetic is injected to numb that nerve. This will be repeated at multiple sites around the knee to block all genicular nerves.  Will the procedure be painful? The injection can be painful and we therefore provide the option of receiving IV sedation. IV sedation, combined with local anesthetic, can make the injection nearly pain free. It allows you to remain very still during the procedure, which can also make the injection easier, faster, and more successful. If you decide to have IV sedation, you must have a driver to get you home safely afterwards. In addition, you cannot have anything to eat or drink within 8 hours of your appointment (clear liquids are allowed until 3 hours before the procedure). If you take medications for diabetes, these medications may need to be adjusted the morning of the procedure. Your primary care physician can help you with this adjustment.  What are the discharge instructions? If you received IV sedation do not drive or operate machinery for at least 24 hours after the procedure. You may return to work the next day following your procedure. You may resume your normal diet immediately. Do not engage in any strenuous activity for 24 hours. You should, however, engage in moderate activity that typically causes your ususal pain. If the block works, those activities should not be painful for several hours after the injection. Do not take a bath, swim, or use a hot tub for 24 hours (you may take a shower). Call the office if you have any of the following: severe pain afterwards (different than your usual  symptoms), redness/swelling/discharge at the injection site(s), fevers/chills, difficulty with bowel or bladder functions.  What are the risks and side effects? The complication rate for this procedure is very low. Whenever a needle enters the skin, bleeding or infection can occur. Some other serious but extremely rare risks include paralysis and death. You may have an allergic reaction to any of the medications used. If you have a known allergy to any medications, especially local anesthetics, notify our staff before the procedure takes place. You may experience any of the following side effects up to 4 - 6 hours after the procedure: . Leg muscle weakness or numbness may occur due to the local anesthetic affecting the nerves that control your legs (this is a temporary affect and it is not paralysis). If you have any leg weakness or numbness, walk only with assistance in order to prevent falls and injury. Your leg strength will return slowly and completely. . Dizziness may  occur due to a decrease in your blood pressure. If this occurs, remain in a seated or lying position. Gradually sit up, and then stand after at least 10 minutes of sitting. . Mild headaches may occur. Drink fluids and take pain medications if needed. If the headaches persist or become severe, call the office. . Mild discomfort at the injection site can occur. This typically lasts for a few hours but can persist for a couple days. If this occurs, take anti-inflammatories or pain medications, apply ice to the area the day of the procedure. If it persists, apply moist heat in the day(s) following.  The side effects listed above can be normal. They are not dangerous and will resolve on their own. If, however, you experience any of the following, a complication may have occurred and you should either contact your doctor. If he is not readily available, then you should proceed to the closest urgent care center for evaluation: . Severe or  progressive pain at the injection site(s) . Arm or leg weakness that progressively worsens or persists for longer than 8 hours . Severe or progressive redness, swelling, or discharge from the injections site(s) . Fevers, chills, nausea, or vomiting . Bowel or bladder dysfunction (i.e. inability to urinate or pass stool or difficulty controlling either)  How long does it take for the procedure to work? You should feel relief from your usual pain within the first hour. Again, this is only expected to last for several hours, at the most. Remember, you may be sore in the middle part of your back from the needles, and you must distinguish this from your usual pain. ____________________________________________________________________________________________   Pain Management Discharge Instructions  General Discharge Instructions :  If you need to reach your doctor call: Monday-Friday 8:00 am - 4:00 pm at [phone] or toll free [phone].  After clinic hours [phone] to have operator reach doctor.  Bring all of your medication bottles to all your appointments in the pain clinic.  To cancel or reschedule your appointment with Pain Management please remember to call 24 hours in advance to avoid a fee.  Refer to the educational materials which you have been given on: General Risks, I had my Procedure. Discharge Instructions, Post Sedation.  Post Procedure Instructions:  The drugs you were given will stay in your system until tomorrow, so for the next 24 hours you should not drive, make any legal decisions or drink any alcoholic beverages.  You may eat anything you prefer, but it is better to start with liquids then soups and crackers, and gradually work up to solid foods.  Please notify your doctor immediately if you have any unusual bleeding, trouble breathing or pain that is not related to your normal pain.  Depending on the type of procedure that was done, some parts of your body  may feel week and/or numb.  This usually clears up by tonight or the next day.  Walk with the use of an assistive device or accompanied by an adult for the 24 hours.  You may use ice on the affected area for the first 24 hours.  Put ice in a Ziploc bag and cover with a towel and place against area 15 minutes on 15 minutes off.  You may switch to heat after 24 hours. Radiofrequency Lesioning Radiofrequency lesioning is a procedure that is performed to relieve pain. The procedure is often used for back, neck, or arm pain. Radiofrequency lesioning involves the use of a machine that creates radio  waves to make heat. During the procedure, the heat is applied to the nerve that carries the pain signal. The heat damages the nerve and interferes with the pain signal. Pain relief usually starts about 2 weeks after the procedure and lasts for 6 months to 1 year. You will be awake during the procedure. You will need to be able to talk with the health care provider during the procedure. Tell a health care provider about:  Any allergies you have.  All medicines you are taking, including vitamins, herbs, eye drops, creams, and over-the-counter medicines.  Any problems you or family members have had with anesthetic medicines.  Any blood disorders you have.  Any surgeries you have had.  Any medical conditions you have or have had.  Whether you are pregnant or may be pregnant. What are the risks? Generally, this is a safe procedure. However, problems may occur, including:  Pain or soreness at the injection site.  Allergic reaction to medicines given during the procedure.  Bleeding.  Infection at the injection site.  Damage to nerves or blood vessels. What happens before the procedure? Staying hydrated Follow instructions from your health care provider about hydration, which may include:  Up to 2 hours before the procedure - you may continue to drink clear liquids, such as water, clear fruit  juice, black coffee, and plain tea. Eating and drinking Follow instructions from your health care provider about eating and drinking, which may include:  8 hours before the procedure - stop eating heavy meals or foods, such as meat, fried foods, or fatty foods.  6 hours before the procedure - stop eating light meals or foods, such as toast or cereal.  6 hours before the procedure - stop drinking milk or drinks that contain milk.  2 hours before the procedure - stop drinking clear liquids. Medicines Ask your health care provider about:  Changing or stopping your regular medicines. This is especially important if you are taking diabetes medicines or blood thinners.  Taking medicines such as aspirin and ibuprofen. These medicines can thin your blood. Do not take these medicines unless your health care provider tells you to take them.  Taking over-the-counter medicines, vitamins, herbs, and supplements. General instructions  Plan to have someone take you home from the hospital or clinic.  If you will be going home right after the procedure, plan to have someone with you for 24 hours.  Ask your health care provider what steps will be taken to help prevent infection. These may include: ? Removing hair at the procedure site. ? Washing skin with a germ-killing soap. ? Taking antibiotic medicine. What happens during the procedure?  An IV will be inserted into one of your veins.  You will be given one or more of the following: ? A medicine to help you relax (sedative). ? A medicine to numb the area (local anesthetic).  Your health care provider will insert a radiofrequency needle into the area to be treated. This is done with the help of a type of X-ray (fluoroscopy).  A wire that carries the radio waves (electrode) will be put through the radiofrequency needle.  An electrical pulse will be sent through the electrode to verify the correct nerve that is causing your pain. You will feel  a tingling sensation, and you may have muscle twitching.  The tissue around the needle tip will be heated by an electric current that comes from the radiofrequency machine. This will numb the nerves.  The needle will  be removed.  A bandage (dressing) will be put on the insertion area. The procedure may vary among health care providers and hospitals.   What happens after the procedure?  Your blood pressure, heart rate, breathing rate, and blood oxygen level will be monitored until you leave the hospital or clinic.  Return to your normal activities as told by your health care provider. Ask your health care provider what activities are safe for you.  Do not drive for 24 hours if you were given a sedative during your procedure. Summary  Radiofrequency lesioning is a procedure that is performed to relieve pain. The procedure is often used for back, neck, or arm pain.  Radiofrequency lesioning involves the use of a machine that creates radio waves to make heat.  Plan to have someone take you home from the hospital or clinic. Do not drive for 24 hours if you were given a sedative during your procedure.  Return to your normal activities as told by your health care provider. Ask your health care provider what activities are safe for you. This information is not intended to replace advice given to you by your health care provider. Make sure you discuss any questions you have with your health care provider. Document Revised: 02/21/2018 Document Reviewed: 02/21/2018 [employer] Patient Education  [place].

## 2020-09-28 NOTE — Progress Notes (Signed)
PROVIDER NOTE: Information contained herein reflects review and annotations entered in association with encounter. Interpretation of such information and data should be left to medically-trained personnel. Information provided to patient can be located elsewhere in the medical record under "Patient Instructions". Document created using STT-dictation technology, any transcriptional errors that may result from process are unintentional.    Patient: Aaron Gibbs  Service Category: Procedure  Provider: Gaspar Cola, MD  DOB: [birth year]  DOS: 09/28/2020  Location: [facility]  MRN: [id number]  Setting: Ambulatory - outpatient  Referring Provider: Milinda Pointer, MD  Type: Established Patient  Specialty: Interventional Pain Management  PCP: Olin Hauser, DO   Primary Reason for Visit: Interventional Pain Management Treatment. CC: Neck Pain  Procedure:          Anesthesia, Analgesia, Anxiolysis:  Type: Therapeutic Superolateral, Superomedial, and Inferomedial, Genicular Nerve Radiofrequency Ablation (destruction).   #3  Region: Lateral, Anterior, and Medial aspects of the knee joint, above and below the knee joint proper. Level: Superior and inferior to the knee joint. Laterality: Left  Type: Local Anesthesia Indication(s): Analgesia         Route: Infiltration ([place]/IM) IV Access: Declined Sedation: Declined  Local Anesthetic: Lidocaine 1-2%  Position: Supine   Indications: 1. Chronic knee pain (Left)   2. Lateral meniscal tear, sequela (Left)   3. Medial meniscal tear, sequela (Left)   4. Osteoarthritis of knee (Left)   5. Tricompartment osteoarthritis of knee (Left)   6. Chronic pain syndrome   7. Chronic knee pain (Primary Area of Pain) (Bilateral) (L>R)   8. Osteoarthritis of knee (Bilateral)    Aaron Gibbs has been dealing with the above chronic pain for longer than three months and has either failed to respond, was unable to tolerate, or  simply did not get enough benefit from other more conservative therapies including, but not limited to: 1. Over-the-counter medications 2. Anti-inflammatory medications 3. Muscle relaxants 4. Membrane stabilizers 5. Opioids 6. Physical therapy and/or chiropractic manipulation 7. Modalities (Heat, ice, etc.) 8. Invasive techniques such as nerve blocks. Aaron Gibbs has attained more than 50% relief of the pain from a series of diagnostic injections conducted in separate occasions.  Pain Score: Pre-procedure: 7 /10 Post-procedure: 5 /10   RTCB: 01/26/2021  Pre-op H&P Assessment:  Aaron Gibbs is a 86 y.o. (year old), male patient, seen today for interventional treatment. He  has a past surgical history that includes Cholecystectomy and Transurethral resection of prostate (N/A, 2008). [name] has a current medication list which includes the following prescription(s): albuterol, amlodipine, azelastine hcl, [START ON 10/06/2020] buprenorphine, [START ON 11/03/2020] buprenorphine, [START ON 12/01/2020] buprenorphine, [START ON 12/29/2020] buprenorphine, cosopt, diclofenac sodium, duloxetine, fenofibrate, ferrous sulfate, fluticasone, hydroxyzine, lorazepam, montelukast, multivitamin with minerals, mupirocin cream, pantoprazole, prednisolone acetate, quetiapine, risperidone, tamsulosin, tramadol, and trazodone. His primarily concern today is the Neck Pain  Initial Vital Signs:  Pulse/HCG Rate: 71ECG Heart Rate: 70 Temp: (!) 97.1 F (36.2 C) Resp: 16 BP: 111/62 SpO2: 97 %  BMI: Estimated body mass index is 30.13 kg/m as calculated from the following:   Height as of this encounter: 5\' 10"  (1.778 m).   Weight as of this encounter: 210 lb (95.3 kg).  Risk Assessment: Allergies: Reviewed. He is allergic to azithromycin.  Allergy Precautions: None required Coagulopathies: Reviewed. None identified.  Blood-thinner therapy: None at this time Active Infection(s): Reviewed. None identified. Mr.  Gibbs is afebrile  Site Confirmation: Aaron Gibbs was asked to confirm the procedure and laterality  before marking the site Procedure checklist: Completed Consent: Before the procedure and under the influence of no sedative(s), amnesic(s), or anxiolytics, the patient was informed of the treatment options, risks and possible complications. To fulfill our ethical and legal obligations, as recommended by the American Medical Association's Code of Ethics, I have informed the patient of my clinical impression; the nature and purpose of the treatment or procedure; the risks, benefits, and possible complications of the intervention; the alternatives, including doing nothing; the risk(s) and benefit(s) of the alternative treatment(s) or procedure(s); and the risk(s) and benefit(s) of doing nothing. The patient was provided information about the general risks and possible complications associated with the procedure. These may include, but are not limited to: failure to achieve desired goals, infection, bleeding, organ or nerve damage, allergic reactions, paralysis, and death. In addition, the patient was informed of those risks and complications associated to the procedure, such as failure to decrease pain; infection; bleeding; organ or nerve damage with subsequent damage to sensory, motor, and/or autonomic systems, resulting in permanent pain, numbness, and/or weakness of one or several areas of the body; allergic reactions; (i.e.: anaphylactic reaction); and/or death. Furthermore, the patient was informed of those risks and complications associated with the medications. These include, but are not limited to: allergic reactions (i.e.: anaphylactic or anaphylactoid reaction(s)); adrenal axis suppression; blood sugar elevation that in diabetics may result in ketoacidosis or comma; water retention that in patients with history of congestive heart failure may result in shortness of breath, pulmonary edema, and  decompensation with resultant heart failure; weight gain; swelling or edema; medication-induced neural toxicity; particulate matter embolism and blood vessel occlusion with resultant organ, and/or nervous system infarction; and/or aseptic necrosis of one or more joints. Finally, the patient was informed that Medicine is not an exact science; therefore, there is also the possibility of unforeseen or unpredictable risks and/or possible complications that may result in a catastrophic outcome. The patient indicated having understood very clearly. We have given the patient no guarantees and we have made no promises. Enough time was given to the patient to ask questions, all of which were answered to the patient's satisfaction. Mr. Mintzer has indicated that he wanted to continue with the procedure. Attestation: I, the ordering provider, attest that I have discussed with the patient the benefits, risks, side-effects, alternatives, likelihood of achieving goals, and potential problems during recovery for the procedure that I have provided informed consent. Date  Time: 09/28/2020 12:42 PM  Pre-Procedure Preparation:  Monitoring: As per clinic protocol. Respiration, ETCO2, SpO2, BP, heart rate and rhythm monitor placed and checked for adequate function Safety Precautions: Patient was assessed for positional comfort and pressure points before starting the procedure. Time-out: I initiated and conducted the "Time-out" before starting the procedure, as per protocol. The patient was asked to participate by confirming the accuracy of the "Time Out" information. Verification of the correct person, site, and procedure were performed and confirmed by me, the nursing staff, and the patient. "Time-out" conducted as per Joint Commission's Universal Protocol (UP.01.01.01). Time: 1308  Description of Procedure:          Target Area: For Genicular Nerve radiofrequency ablation (destruction), the targets are: the superolateral  genicular nerve, located in the lateral distal portion of the femoral shaft as it curves to form the lateral epicondyle, in the region of the distal femoral metaphysis; the superomedial genicular nerve, located in the medial distal portion of the femoral shaft as it curves to form the medial epicondyle; and  the inferomedial genicular nerve, located in the medial, proximal portion of the tibial shaft, as it curves to form the medial epicondyle, in the region of the proximal tibial metaphysis. Approach: Anterior, ipsilateral approach. Area Prepped: Entire knee area, from mid-thigh to mid-shin, lateral, anterior, and medial aspects. DuraPrep (Iodine Povacrylex [0.7% available iodine] and Isopropyl Alcohol, 74% w/w) Safety Precautions: Aspiration looking for blood return was conducted prior to all injections. At no point did we inject any substances, as a needle was being advanced. No attempts were made at seeking any paresthesias. Safe injection practices and needle disposal techniques used. Medications properly checked for expiration dates. SDV (single dose vial) medications used. Description of the Procedure: Protocol guidelines were followed. The patient was placed in position over the procedure table. The target area was identified and the area prepped in the usual manner. The skin and muscle were infiltrated with local anesthetic. Appropriate amount of time allowed to pass for local anesthetics to take effect. Radiofrequency needles were introduced to the target area using fluoroscopic guidance. Using the NeuroTherm NT1100 Radiofrequency Generator, sensory stimulation using 50 Hz was used to locate & identify the nerve, making sure that the needle was positioned such that there was no sensory stimulation below 0.3 V or above 0.7 V. Stimulation using 2 Hz was used to evaluate the motor component. Care was taken not to lesion any nerves that demonstrated motor stimulation of the lower extremities at an output  of less than 2.5 times that of the sensory threshold, or a maximum of 2.0 V. Once satisfactory placement of the needles was achieved, the numbing solution was slowly injected after negative aspiration. After waiting for at least 2 minutes, the ablation was performed at 80 degrees C for 60 seconds, using regular Radiofrequency settings. Once the procedure was completed, the needles were then removed and the area cleansed, making sure to leave some of the prepping solution back to take advantage of its long term bactericidal properties. Intra-operative Compliance: Compliant  Vitals:   09/28/20 1305 09/28/20 1310 09/28/20 1315 09/28/20 1320  BP: 114/65 110/70 111/61 105/65  Pulse:      Resp: 16 18 (!) 22 20  Temp:      SpO2: 95% 94% 93% 95%  Weight:      Height:        Start Time: 1308 hrs. End Time: 1325 hrs. Materials & Medications:  Needle(s) Type: Teflon-coated, curved tip, Radiofrequency needle(s) Gauge: 22G Length: 10cm Medication(s): Please see orders for medications and dosing details.  Imaging Guidance (Non-Spinal):          Type of Imaging Technique: Fluoroscopy Guidance (Non-Spinal) Indication(s): Assistance in needle guidance and placement for procedures requiring needle placement in or near specific anatomical locations not easily accessible without such assistance. Exposure Time: Please see nurses notes. Contrast: Before injecting any contrast, we confirmed that the patient did not have an allergy to iodine, shellfish, or radiological contrast. Once satisfactory needle placement was completed at the desired level, radiological contrast was injected. Contrast injected under live fluoroscopy. No contrast complications. See chart for type and volume of contrast used. Fluoroscopic Guidance: I was personally present during the use of fluoroscopy. "Tunnel Vision Technique" used to obtain the best possible view of the target area. Parallax error corrected before commencing the  procedure. "Direction-depth-direction" technique used to introduce the needle under continuous pulsed fluoroscopy. Once target was reached, antero-posterior, oblique, and lateral fluoroscopic projection used confirm needle placement in all planes. Images permanently stored in EMR. Interpretation: I  personally interpreted the imaging intraoperatively. Adequate needle placement confirmed in multiple planes. Appropriate spread of contrast into desired area was observed. No evidence of afferent or efferent intravascular uptake. Permanent images saved into the patient's record.  Antibiotic Prophylaxis:   Anti-infectives (From admission, onward)   None     Indication(s): None identified  Post-operative Assessment:  Post-procedure Vital Signs:  Pulse/HCG Rate: 7168 Temp: (!) 97.1 F (36.2 C) Resp: 20 BP: 105/65 SpO2: 95 %  EBL: None  Complications: No immediate post-treatment complications observed by team, or reported by patient.  Note: The patient tolerated the entire procedure well. A repeat set of vitals were taken after the procedure and the patient was kept under observation following institutional policy, for this type of procedure. Post-procedural neurological assessment was performed, showing return to baseline, prior to discharge. The patient was provided with post-procedure discharge instructions, including a section on how to identify potential problems. Should any problems arise concerning this procedure, the patient was given instructions to immediately contact us, at any time, without hesitation. In any case, we plan to contact the patient by telephone for a follow-up status report regarding this interventional procedure.  Comments:  No additional relevant information.  Plan of Care  Orders:  Orders Placed This Encounter  Procedures  . Radiofrequency,Genicular    Scheduling Instructions:     Side(s): Left Knee     Level(s): Superior-Lateral, Superior-Medial, and  Inferior-Medial Genicular Nerve(s)     Sedation: Patient's choice.     Timeframe: Today    Order Specific Question:   Where will this procedure be performed?    Answer:   [facility]  . DG [facility] C-ARM 1-60 MIN NO REPORT    Intraoperative interpretation by procedural physician at [place].    Standing Status:   Standing    Number of Occurrences:   1    Order Specific Question:   Reason for exam:    Answer:   Assistance in needle guidance and placement for procedures requiring needle placement in or near specific anatomical locations not easily accessible without such assistance.  . Informed Consent Details: Physician/Practitioner Attestation; Transcribe to consent form and obtain patient signature    Note: Always confirm laterality of pain with Mr. Miler, before procedure. Transcribe to consent form and obtain patient signature.    Order Specific Question:   Physician/Practitioner attestation of informed consent for procedure/surgical case    Answer:   I, the physician/practitioner, attest that I have discussed with the patient the benefits, risks, side effects, alternatives, likelihood of achieving goals and potential problems during recovery for the procedure that I have provided informed consent.    Order Specific Question:   Procedure    Answer:   Radiofrequency ablation of the genicular nerves of the knee    Order Specific Question:   Physician/Practitioner performing the procedure    Answer:   Shant Hence A. Dossie Arbour, MD    Order Specific Question:   Indication/Reason    Answer:   Chronic knee pain  . Provide equipment / supplies at bedside    "Radiofrequency Tray"; Large hemostat (x1); Small hemostat (x1); Towels (x8); 4x4 sterile sponge pack (x1) Needle type: Teflon-coated Radiofrequency Needle (Disposable  single use) Size: Regular Quantity: 3    Standing Status:   Standing    Number of Occurrences:   1    Order Specific Question:   Specify     Answer:   Radiofrequency Tray   Chronic Opioid Analgesic:  No  opioid analgesics prescribed by our practice.  Buprenorphine 10 mcg/h patch q. 7 days.  High risk for SUD.  History of suicidal attempts and noncompliance with medication intake. ED visits thought to be due to medication binging. MME/day: 0 mg/day.   Medications ordered for procedure: Meds ordered this encounter  Medications  . lidocaine (XYLOCAINE) 2 % (with pres) injection 400 mg  . DISCONTD: lactated ringers infusion 1,000 mL  . DISCONTD: midazolam (VERSED) 5 MG/5ML injection 1-2 mg    Make sure Flumazenil is available in the pyxis when using this medication. If oversedation occurs, administer 0.2 mg IV over 15 sec. If after 45 sec no response, administer 0.2 mg again over 1 min; may repeat at 1 min intervals; not to exceed 4 doses (1 mg)  . DISCONTD: fentaNYL (SUBLIMAZE) injection 25-50 mcg    Make sure Narcan is available in the pyxis when using this medication. In the event of respiratory depression (RR< 8/min): Titrate NARCAN (naloxone) in increments of 0.1 to 0.2 mg IV at 2-3 minute intervals, until desired degree of reversal.  . methylPREDNISolone acetate (DEPO-MEDROL) injection 80 mg  . ropivacaine (PF) 2 mg/mL (0.2%) (NAROPIN) injection 9 mL  . buprenorphine (BUTRANS) 10 MCG/HR PTWK    Sig: Place 1 patch onto the skin once a week for 28 days. Apply only 1 patch at a time and alternate sites weekly.    Dispense:  4 patch    Refill:  0    Chronic Pain: STOP Act (Not applicable) Fill 1 day early if closed on refill date. Avoid benzodiazepines within 8 hours of opioids  . buprenorphine (BUTRANS) 10 MCG/HR PTWK    Sig: Place 1 patch onto the skin once a week for 28 days. Apply only 1 patch at a time and alternate sites weekly.    Dispense:  4 patch    Refill:  0    Chronic Pain: STOP Act (Not applicable) Fill 1 day early if closed on refill date. Avoid benzodiazepines within 8 hours of opioids  . buprenorphine (BUTRANS) 10  MCG/HR PTWK    Sig: Place 1 patch onto the skin once a week for 28 days. Apply only 1 patch at a time and alternate sites weekly.    Dispense:  4 patch    Refill:  0    Chronic Pain: STOP Act (Not applicable) Fill 1 day early if closed on refill date. Avoid benzodiazepines within 8 hours of opioids   Medications administered: We administered lidocaine, methylPREDNISolone acetate, and ropivacaine (PF) 2 mg/mL (0.2%).  See the medical record for exact dosing, route, and time of administration.  Follow-up plan:   Return in about 6 weeks (around 11/09/2020) for on afternoon of procedure day, (VV), (PPE).       Interventional Therapies  Risk  Complexity Considerations:   Patient is legally blind. High risk for substance use disorder.  The patient has documented suicidal attempt using medications.  In addition, we have documented the patient to have a binge use pattern and been noncompliant with the instructions on how to use the medications.  This is a reason why went to a buprenorphine patch.   Planned  Pending:   Repeat bilateral genicular nerve RFA #3    Under consideration:   Palliative bilateral genicular nerve RFA maintenance treatments   Completed:   Palliative bilateral IA Hyalgan knee injections Sx2  Diagnostic bilateral genicular NB x1  Palliative right genicular nerve RFA x2 (04/06/2020)  Palliative left genicular nerve RFA x2 (03/23/2020)  Therapeutic  Palliative (PRN) options:   Palliative bilateral IA Hyalgan knee injections S3/N1  Diagnostic bilateral genicular NB #2  Palliative bilateral genicular nerve RFA #3      Recent Visits Date Type Provider Dept  08/09/20 Office Visit Milinda Pointer, MD [facility]  Showing recent visits within past 90 days and meeting all other requirements Today's Visits Date Type Provider Dept  09/28/20 Procedure visit Milinda Pointer, MD [facility]  Showing today's visits and meeting all other  requirements Future Appointments Date Type Provider Dept  11/02/20 Appointment Milinda Pointer, MD [facility]  Showing future appointments within next 90 days and meeting all other requirements  Disposition: Discharge home  Discharge (Date  Time): 09/28/2020; 1340 hrs.   Primary Care Physician: Olin Hauser, DO Location: [facility] Note by: Gaspar Cola, MD Date: 09/28/2020; Time: 1:47 PM  Disclaimer:  Medicine is not an Chief Strategy Officer. The only guarantee in medicine is that nothing is guaranteed. It is important to note that the decision to proceed with this intervention was based on the information collected from the patient. The Data and conclusions were drawn from the patient's questionnaire, the interview, and the physical examination. Because the information was provided in large part by the patient, it cannot be guaranteed that it has not been purposely or unconsciously manipulated. Every effort has been made to obtain as much relevant data as possible for this evaluation. It is important to note that the conclusions that lead to this procedure are derived in large part from the available data. Always take into account that the treatment will also be dependent on availability of resources and existing treatment guidelines, considered by other Pain Management Practitioners as being common knowledge and practice, at the time of the intervention. For Medico-Legal purposes, it is also important to point out that variation in procedural techniques and pharmacological choices are the acceptable norm. The indications, contraindications, technique, and results of the above procedure should only be interpreted and judged by a Board-Certified Interventional Pain Specialist with extensive familiarity and expertise in the same exact procedure and technique.

## 2020-09-28 NOTE — Progress Notes (Signed)
Safety precautions to be maintained throughout the outpatient stay will include: orient to surroundings, keep bed in low position, maintain call bell within reach at all times, provide assistance with transfer out of bed and ambulation.  

## 2020-09-29 ENCOUNTER — Telehealth: Payer: Self-pay

## 2020-09-29 NOTE — Telephone Encounter (Signed)
Post procedure phone call.  LM 

## 2020-10-07 ENCOUNTER — Other Ambulatory Visit: Payer: Self-pay | Admitting: Family Medicine

## 2020-10-07 DIAGNOSIS — F333 Major depressive disorder, recurrent, severe with psychotic symptoms: Secondary | ICD-10-CM

## 2020-10-15 ENCOUNTER — Other Ambulatory Visit: Payer: Self-pay | Admitting: Family Medicine

## 2020-10-15 DIAGNOSIS — F410 Panic disorder [episodic paroxysmal anxiety] without agoraphobia: Secondary | ICD-10-CM

## 2020-10-15 NOTE — Telephone Encounter (Signed)
Requested medication (s) are due for refill today: no  Requested medication (s) are on the active medication list: yes  Last refill: 10/15/2020  Future visit scheduled: no  Notes to clinic: this refill cannot be delegated    Requested Prescriptions  Pending Prescriptions Disp Refills   LORazepam (ATIVAN) 0.5 MG tablet [Pharmacy Med Name: LORAZEPAM 0.5 MG TAB] 90 tablet     Sig: TAKE 1 TABLET BY MOUTH EVERY 6 HOURS AS NEEDED ANXIETY      Not Delegated - Psychiatry:  Anxiolytics/Hypnotics Failed - 10/15/2020  8:41 AM      Failed - This refill cannot be delegated      Passed - Urine Drug Screen completed in last 360 days      Passed - Valid encounter within last 6 months    Recent Outpatient Visits           4 weeks ago Seasonal allergic rhinitis due to other allergic trigger   [facility] Olin Hauser, DO   1 month ago COPD with acute exacerbation [facility])   K[place]ett, DO   2 months ago Upper respiratory tract infection, unspecified type   [facility] Jon Billings, NP   3 months ago Recurrent UTI   [place], DO   7 months ago Chronic pain syndrome   [facility] [place], Devonne Doughty, DO       Future Appointments             In 2 weeks Milinda Pointer, MD [place]   In 9 months  [facility]

## 2020-11-01 ENCOUNTER — Telehealth: Payer: Self-pay | Admitting: *Deleted

## 2020-11-01 ENCOUNTER — Encounter: Payer: Self-pay | Admitting: Pain Medicine

## 2020-11-01 NOTE — Telephone Encounter (Signed)
Attempted to call for pre appointment review of allergies/meds. Message left. 

## 2020-11-02 ENCOUNTER — Other Ambulatory Visit: Payer: Self-pay

## 2020-11-02 ENCOUNTER — Ambulatory Visit: Payer: Medicare Other | Attending: Pain Medicine | Admitting: Pain Medicine

## 2020-11-02 DIAGNOSIS — S83282S Other tear of lateral meniscus, current injury, left knee, sequela: Secondary | ICD-10-CM | POA: Diagnosis not present

## 2020-11-02 DIAGNOSIS — M25562 Pain in left knee: Secondary | ICD-10-CM

## 2020-11-02 DIAGNOSIS — G894 Chronic pain syndrome: Secondary | ICD-10-CM

## 2020-11-02 DIAGNOSIS — M1712 Unilateral primary osteoarthritis, left knee: Secondary | ICD-10-CM | POA: Diagnosis not present

## 2020-11-02 DIAGNOSIS — S83242S Other tear of medial meniscus, current injury, left knee, sequela: Secondary | ICD-10-CM

## 2020-11-02 DIAGNOSIS — G8929 Other chronic pain: Secondary | ICD-10-CM

## 2020-11-02 MED ORDER — DICLOFENAC SODIUM 1 % EX GEL: 2.0000 g | Freq: Every day | CUTANEOUS | 0 refills | Status: DC

## 2020-11-02 NOTE — Progress Notes (Signed)
Patient: Aaron Gibbs  Service Category: E/M  Provider: Gaspar Cola, MD  DOB: 26-Aug-1934  DOS: 11/02/2020  Location: Office  MRN: [id number]  Setting: Ambulatory outpatient  Referring Provider: [name] *  Type: Established Patient  Specialty: Interventional Pain Management  PCP: Olin Hauser, DO  Location: Remote location  Delivery: TeleHealth     Virtual Encounter - Pain Management PROVIDER NOTE: Information contained herein reflects review and annotations entered in association with encounter. Interpretation of such information and data should be left to medically-trained personnel. Information provided to patient can be located elsewhere in the medical record under "Patient Instructions". Document created using STT-dictation technology, any transcriptional errors that may result from process are unintentional.    Contact & Pharmacy Preferred: [phone] Home: [phone] (home) Mobile: [phone] (mobile) E-mail: [email or web address]  [place], [place] [address] Phone: [phone] Fax: [phone]   Pre-screening  Mr. Kittleson offered "in-person" vs "virtual" encounter. He indicated preferring virtual for this encounter.   Reason COVID-19*  Social distancing based on CDC and AMA recommendations.   I contacted [name] on 11/02/2020 via telephone.      I clearly identified myself as Gaspar Cola, MD. I verified that I was speaking with the correct person using two identifiers (Name: Briggs Edelen, and date of birth: 04-14-1935).  Consent I sought verbal advanced consent from [name] for virtual visit interactions. I informed Mr. Bushey of possible security and privacy concerns, risks, and limitations associated with providing "not-in-person" medical evaluation and management services. I also informed Mr. Cahalan of the availability of "in-person" appointments.  Finally, I informed him that there would be a charge for the virtual visit and that he could be  personally, fully or partially, financially responsible for it. Mr. Rackley expressed understanding and agreed to proceed.   Historic Elements   Mr. Antowan Samford is a 86 y.o. year old, male patient evaluated today after our last contact on 09/28/2020. Mr. Scalisi  has a past medical history of Anxiety, Cancer ([place]), COPD (chronic obstructive pulmonary disease) ([place]), Glaucoma, and Osteoporosis. He also  has a past surgical history that includes Cholecystectomy and Transurethral resection of prostate (N/A, 2008). Mr. Ferber has a current medication list which includes the following prescription(s): albuterol, amlodipine, azelastine hcl, buprenorphine, [START ON 11/03/2020] buprenorphine, [START ON 12/01/2020] buprenorphine, [START ON 12/29/2020] buprenorphine, cosopt, duloxetine, fenofibrate, ferrous sulfate, fluticasone, hydroxyzine, lorazepam, montelukast, multivitamin with minerals, mupirocin cream, pantoprazole, prednisolone acetate, quetiapine, risperidone, tamsulosin, tramadol, trazodone, and diclofenac sodium. He  reports that he has quit smoking. He quit after 20.00 years of use. His smokeless tobacco use includes chew. He reports previous alcohol use. He reports that he does not use drugs. Mr. Britten is allergic to azithromycin.   HPI  Today, he is being contacted for a post-procedure assessment.  Again, the patient has done extremely well with this left genicular nerve radiofrequency ablation.  He indicates currently enjoying an ongoing 90% relief of the pain in the left leg.  He refers there is during the day he is not having any pain at all and its only at night that he has some pain in the left knee.  Today he has requested a refill of his Voltaren gel since he describes that that tends to help him with that discomfort at night.  In the case of the right lower extremity, he indicates that he still  has an ongoing 100% relief of the pain  from the radiofrequency ablation done on 04/06/2020.  RTCB: 01/26/2021 Nonopioid transferred 11/02/2020: Voltaren gel  Post-Procedure Evaluation  Procedure (09/28/2020): Therapeutic left genicular nerve RFA #3 under fluoroscopic guidance, no sedation Pre-procedure pain level: 7/10 Post-procedure: 5/10 Limited initial benefit, possibly due to rapid discharge after no sedation procedure, without enough time to allow full onset of block.  Sedation: None.  Effectiveness during initial hour after procedure(Ultra-Short Term Relief): 100 %.  Local anesthetic used: Long-acting (4-6 hours) Effectiveness: Defined as any analgesic benefit obtained secondary to the administration of local anesthetics. This carries significant diagnostic value as to the etiological location, or anatomical origin, of the pain. Duration of benefit is expected to coincide with the duration of the local anesthetic used.  Effectiveness during initial 4-6 hours after procedure(Short-Term Relief): 50 %.  Long-term benefit: Defined as any relief past the pharmacologic duration of the local anesthetics.  Effectiveness past the initial 6 hours after procedure(Long-Term Relief): 50 %.  Current benefits: Defined as benefit that persist at this time.   Analgesia:  The patient indicates having an ongoing 90% relief of the pain on the left leg and he continues to enjoy a 100% relief on the right leg from the prior radiofrequency.  At this point he refers doing well and not needing any other interventional therapies for the time being. Function: Mr. Pavao reports improvement in function ROM: Mr. Hank reports improvement in ROM  Pharmacotherapy Assessment  Analgesic: No opioid analgesics prescribed by our practice.  Buprenorphine 10 mcg/h patch q. 7 days.  High risk for SUD.  History of suicidal attempts and noncompliance with medication intake. 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 MME/day: 0 mg/day.   Monitoring: [place] PMP: PDMP reviewed during this encounter.       Pharmacotherapy: No side-effects or adverse reactions reported. Compliance: No problems identified. Effectiveness: Clinically acceptable. Plan: Refer to "POC".  UDS:  Summary  Date Value Ref Range Status  02/02/2020 Note  Final    Comment:    ==================================================================== [employer] Select 13 (MW) ==================================================================== Test                             Result       Flag       Units  Drug Present and Declared for Prescription Verification   Buprenorphine                  14           EXPECTED   ng/mg creat   Norbuprenorphine               22           EXPECTED   ng/mg creat    Source of buprenorphine is a scheduled prescription medication.    Norbuprenorphine is an expected metabolite of buprenorphine.  Drug Present not Declared for Prescription Verification   Oxazepam                       21           UNEXPECTED ng/mg creat    Oxazepam may be administered as a scheduled prescription medication;    it is also an expected metabolite of other benzodiazepine drugs,    including diazepam, chlordiazepoxide, prazepam, clorazepate,    halazepam, and temazepam.    Lorazepam  730          UNEXPECTED ng/mg creat    Source of lorazepam is a scheduled prescription medication.  ==================================================================== Test                      Result    Flag   Units      Ref Range   Creatinine              105              mg/dL      >=20 ==================================================================== Declared Medications:  The flagging and interpretation on this report are based on the  following declared medications.  Unexpected results may arise from  inaccuracies in the declared medications.   **Note: The testing scope of this panel does not include small  to  moderate amounts of these reported medications:   Buprenorphine Patch (BuTrans)   **Note: The testing scope of this panel does not include the  following reported medications:   Albuterol  Amlodipine  Aspirin  Diclofenac (Voltaren)  Duloxetine (Cymbalta)  Fenofibrate (TriCor)  Iron  Multivitamin  Pantoprazole (Protonix)  Risperidone  Timolol  Trazodone  Umeclidinium (Anoro)  Vilanterol (Anoro) ==================================================================== For clinical consultation, please call [phone]. ====================================================================     Laboratory Chemistry Profile   Renal Lab Results  Component Value Date   BUN 25 (H) 02/09/2020   CREATININE 1.04 02/09/2020   LABCREA 631 12/13/2019   BCR 28 (H) 07/10/2018   GFRAA >60 02/09/2020   GFRNONAA >60 02/09/2020     Hepatic Lab Results  Component Value Date   AST 28 02/08/2020   ALT 29 02/08/2020   ALBUMIN 4.2 02/08/2020   ALKPHOS 48 02/08/2020   LIPASE 24 06/04/2018   AMMONIA 25 09/13/2019     Electrolytes Lab Results  Component Value Date   NA 136 02/09/2020   K 3.9 02/09/2020   CL 105 02/09/2020   CALCIUM 9.6 02/09/2020   MG 2.3 04/02/2019   PHOS 3.4 03/30/2019     Bone Lab Results  Component Value Date   VD25OH 26 08/15/2016   25OHVITD1 34 07/10/2018   25OHVITD2 <1.0 07/10/2018   25OHVITD3 34 07/10/2018     Inflammation (CRP: Acute Phase) (ESR: Chronic Phase) Lab Results  Component Value Date   CRP 5 07/10/2018   ESRSEDRATE 20 07/10/2018   LATICACIDVEN 1.1 12/13/2019       Note: Above Lab results reviewed.  Imaging  DG PAIN CLINIC C-ARM 1-60 MIN NO REPORT Fluoro was used, but no Radiologist interpretation will be provided.  Please refer to "NOTES" tab for provider progress note.  Assessment  The primary encounter diagnosis was Chronic knee pain (Left). Diagnoses of Lateral meniscal tear, sequela (Left), Medial meniscal tear,  sequela (Left), Osteoarthritis of knee (Left), Tricompartment osteoarthritis of knee (Left), and Chronic pain syndrome were also pertinent to this visit.  Plan of Care  Problem-specific:  No problem-specific Assessment & Plan notes found for this encounter.  Mr. Paarth Cropper has a current medication list which includes the following long-term medication(s): albuterol, amlodipine, azelastine hcl, buprenorphine, [START ON 11/03/2020] buprenorphine, [START ON 12/01/2020] buprenorphine, [START ON 12/29/2020] buprenorphine, duloxetine, fenofibrate, ferrous sulfate, fluticasone, montelukast, pantoprazole, quetiapine, risperidone, trazodone, and diclofenac sodium.  Pharmacotherapy (Medications Ordered): Meds ordered this encounter  Medications  . diclofenac Sodium (VOLTAREN) 1 % GEL    Sig: Apply 2 g topically at bedtime.    Dispense:  350 g    Refill:  0    Fill one day early if pharmacy is closed on scheduled refill date. Generic permitted. Do not send renewal requests. Void any older duplicate prescription or refill(s) that may be on file.   Orders:  No orders of the defined types were placed in this encounter.  Follow-up plan:   Return in about 3 months (around 01/26/2021) for evaluation day (F2F) (MM).      Interventional Therapies  Risk  Complexity Considerations:   Patient is legally blind. High risk for substance use disorder.  The patient has documented suicidal attempt using medications.  In addition, we have documented the patient to have a binge use pattern and been noncompliant with the instructions on how to use the medications.  This is a reason why went to a buprenorphine patch.   Planned  Pending:   Repeat bilateral genicular nerve RFA #3    Under consideration:   Palliative bilateral genicular nerve RFA maintenance treatments   Completed:   Palliative bilateral IA Hyalgan knee injections Sx2  Diagnostic bilateral genicular NB x1  Palliative right genicular nerve RFA  x2 (04/06/2020)  Palliative left genicular nerve RFA x2 (03/23/2020)    Therapeutic  Palliative (PRN) options:   Palliative bilateral IA Hyalgan knee injections S3/N1  Diagnostic bilateral genicular NB #2  Palliative bilateral genicular nerve RFA #3     Recent Visits Date Type Provider Dept  09/28/20 Procedure visit Milinda Pointer, MD [facility]  08/09/20 Office Visit Milinda Pointer, MD [facility]  Showing recent visits within past 90 days and meeting all other requirements Today's Visits Date Type Provider Dept  11/02/20 Telemedicine Milinda Pointer, MD [facility]  Showing today's visits and meeting all other requirements Future Appointments No visits were found meeting these conditions. Showing future appointments within next 90 days and meeting all other requirements  I discussed the assessment and treatment plan with the patient. The patient was provided an opportunity to ask questions and all were answered. The patient agreed with the plan and demonstrated an understanding of the instructions.  Patient advised to call back or seek an in-person evaluation if the symptoms or condition worsens.  Duration of encounter: 18 minutes.  Note by: Gaspar Cola, MD Date: 11/02/2020; Time: 10:34 AM

## 2020-11-02 NOTE — Patient Instructions (Signed)
____________________________________________________________________________________________  Drug Holidays (Slow)  What is a "Drug Holiday"? Drug Holiday: is the name given to the period of time during which a patient stops taking a medication(s) for the purpose of eliminating tolerance to the drug.  Benefits . Improved effectiveness of opioids. . Decreased opioid dose needed to achieve benefits. . Improved pain with lesser dose.  What is tolerance? Tolerance: is the progressive decreased in effectiveness of a drug due to its repetitive use. With repetitive use, the body gets use to the medication and as a consequence, it loses its effectiveness. This is a common problem seen with opioid pain medications. As a result, a larger dose of the drug is needed to achieve the same effect that used to be obtained with a smaller dose.  How long should a "Drug Holiday" last? You should stay off of the pain medicine for at least 14 consecutive days. (2 weeks)  Should I stop the medicine "cold turkey"? No. You should always coordinate with your Pain Specialist so that he/she can provide you with the correct medication dose to make the transition as smoothly as possible.  How do I stop the medicine? Slowly. You will be instructed to decrease the daily amount of pills that you take by one (1) pill every seven (7) days. This is called a "slow downward taper" of your dose. For example: if you normally take four (4) pills per day, you will be asked to drop this dose to three (3) pills per day for seven (7) days, then to two (2) pills per day for seven (7) days, then to one (1) per day for seven (7) days, and at the end of those last seven (7) days, this is when the "Drug Holiday" would start.   Will I have withdrawals? By doing a "slow downward taper" like this one, it is unlikely that you will experience any significant withdrawal symptoms. Typically, what triggers withdrawals is the sudden stop of a high  dose opioid therapy. Withdrawals can usually be avoided by slowly decreasing the dose over a prolonged period of time. If you do not follow these instructions and decide to stop your medication abruptly, withdrawals may be possible.  What are withdrawals? Withdrawals: refers to the wide range of symptoms that occur after stopping or dramatically reducing opiate drugs after heavy and prolonged use. Withdrawal symptoms do not occur to patients that use low dose opioids, or those who take the medication sporadically. Contrary to benzodiazepine (example: Valium, Xanax, etc.) or alcohol withdrawals ("Delirium Tremens"), opioid withdrawals are not lethal. Withdrawals are the physical manifestation of the body getting rid of the excess receptors.  Expected Symptoms Early symptoms of withdrawal may include: . Agitation . Anxiety . Muscle aches . Increased tearing . Insomnia . Runny nose . Sweating . Yawning  Late symptoms of withdrawal may include: . Abdominal cramping . Diarrhea . Dilated pupils . Goose bumps . Nausea . Vomiting  Will I experience withdrawals? Due to the slow nature of the taper, it is very unlikely that you will experience any.  What is a slow taper? Taper: refers to the gradual decrease in dose.  (Last update: 01/07/2020) ____________________________________________________________________________________________    ____________________________________________________________________________________________  Medication Recommendations and Reminders  Applies to: All patients receiving prescriptions (written and/or electronic).  Medication Rules & Regulations: These rules and regulations exist for your safety and that of others. They are not flexible and neither are we. Dismissing or ignoring them will be considered "non-compliance" with medication therapy, resulting in complete   and irreversible termination of such therapy. (See document titled "Medication Rules" for  more details.) In all conscience, because of safety reasons, we cannot continue providing a therapy where the patient does not follow instructions.  Pharmacy of record:   Definition: This is the pharmacy where your electronic prescriptions will be sent.   We do not endorse any particular pharmacy, however, we have experienced problems with [facility] not securing enough medication supply for the community.  We do not restrict you in your choice of pharmacy. However, once we write for your prescriptions, we will NOT be re-sending more prescriptions to fix restricted supply problems created by your pharmacy, or your insurance.   The pharmacy listed in the electronic medical record should be the one where you want electronic prescriptions to be sent.  If you choose to change pharmacy, simply notify our nursing staff.  Recommendations:  Keep all of your pain medications in a safe place, under lock and key, even if you live alone. We will NOT replace lost, stolen, or damaged medication.  After you fill your prescription, take 1 week's worth of pills and put them away in a safe place. You should keep a separate, properly labeled bottle for this purpose. The remainder should be kept in the original bottle. Use this as your primary supply, until it runs out. Once it's gone, then you know that you have 1 week's worth of medicine, and it is time to come in for a prescription refill. If you do this correctly, it is unlikely that you will ever run out of medicine.  To make sure that the above recommendation works, it is very important that you make sure your medication refill appointments are scheduled at least 1 week before you run out of medicine. To do this in an effective manner, make sure that you do not leave the office without scheduling your next medication management appointment. Always ask the nursing staff to show you in your prescription , when your medication will be running out. Then arrange for  the receptionist to get you a return appointment, at least 7 days before you run out of medicine. Do not wait until you have 1 or 2 pills left, to come in. This is very poor planning and does not take into consideration that we may need to cancel appointments due to bad weather, sickness, or emergencies affecting our staff.  DO NOT ACCEPT A "Partial Fill": If for any reason your pharmacy does not have enough pills/tablets to completely fill or refill your prescription, do not allow for a "partial fill". The law allows the pharmacy to complete that prescription within 72 hours, without requiring a new prescription. If they do not fill the rest of your prescription within those 72 hours, you will need a separate prescription to fill the remaining amount, which we will NOT provide. If the reason for the partial fill is your insurance, you will need to talk to the pharmacist about payment alternatives for the remaining tablets, but again, DO NOT ACCEPT A PARTIAL FILL, unless you can trust your pharmacist to obtain the remainder of the pills within 72 hours.  Prescription refills and/or changes in medication(s):   Prescription refills, and/or changes in dose or medication, will be conducted only during scheduled medication management appointments. (Applies to both, written and electronic prescriptions.)  No refills on procedure days. No medication will be changed or started on procedure days. No changes, adjustments, and/or refills will be conducted on a procedure day. Doing so   will interfere with the diagnostic portion of the procedure.  No phone refills. No medications will be "called into the pharmacy".  No Fax refills.  No weekend refills.  No Holliday refills.  No after hours refills.  Remember:  Business hours are:  Monday to Thursday 8:00 AM to 4:00 PM Provider's Schedule: Jazmon Kos, MD - Appointments are:  Medication management: Monday and Wednesday 8:00 AM to 4:00 PM Procedure  day: Tuesday and Thursday 7:30 AM to 4:00 PM Bilal Lateef, MD - Appointments are:  Medication management: Tuesday and Thursday 8:00 AM to 4:00 PM Procedure day: Monday and Wednesday 7:30 AM to 4:00 PM (Last update: 01/07/2020) ____________________________________________________________________________________________   ____________________________________________________________________________________________  CBD (cannabidiol) WARNING  Applicable to: All individuals currently taking or considering taking CBD (cannabidiol) and, more important, all patients taking opioid analgesic controlled substances (pain medication). (Example: oxycodone; oxymorphone; hydrocodone; hydromorphone; morphine; methadone; tramadol; tapentadol; fentanyl; buprenorphine; butorphanol; dextromethorphan; meperidine; codeine; etc.)  Legal status: CBD remains a Schedule I drug prohibited for any use. CBD is illegal with one exception. In the [place], CBD has a limited Food and Drug Administration (FDA) approval for the treatment of two specific types of epilepsy disorders. Only one CBD product has been approved by the FDA for this purpose: "Epidiolex". FDA is aware that some companies are marketing products containing cannabis and cannabis-derived compounds in ways that violate the Federal Food, Drug and Cosmetic Act (FD&C Act) and that may put the health and safety of consumers at risk. The FDA, a Federal agency, has not enforced the CBD status since 2018.   Legality: Some manufacturers ship CBD products nationally, which is illegal. Often such products are sold online and are therefore available throughout the country. CBD is openly sold in head shops and health food stores in some states where such sales have not been explicitly legalized. Selling unapproved products with unsubstantiated therapeutic claims is not only a violation of the law, but also can put patients at risk, as these products have not been proven to  be safe or effective. Federal illegality makes it difficult to conduct research on CBD.  Reference: "FDA Regulation of Cannabis and Cannabis-Derived Products, Including Cannabidiol (CBD)" - https://www.fda.gov/news-events/public-health-focus/fda-regulation-cannabis-and-cannabis-derived-products-including-cannabidiol-cbd  Warning: CBD is not FDA approved and has not undergo the same manufacturing controls as prescription drugs.  This means that the purity and safety of available CBD may be questionable. Most of the time, despite manufacturer's claims, it is contaminated with THC (delta-9-tetrahydrocannabinol - the chemical in marijuana responsible for the "HIGH").  When this is the case, the THC contaminant will trigger a positive urine drug screen (UDS) test for Marijuana (carboxy-THC). Because a positive UDS for any illicit substance is a violation of our medication agreement, your opioid analgesics (pain medicine) may be permanently discontinued.  MORE ABOUT CBD  General Information: CBD  is a derivative of the Marijuana (cannabis sativa) plant discovered in 1940. It is one of the 113 identified substances found in Marijuana. It accounts for up to 40% of the plant's extract. As of 2018, preliminary clinical studies on CBD included research for the treatment of anxiety, movement disorders, and pain. CBD is available and consumed in multiple forms, including inhalation of smoke or vapor, as an aerosol spray, and by mouth. It may be supplied as an oil containing CBD, capsules, dried cannabis, or as a liquid solution. CBD is thought not to be as psychoactive as THC (delta-9-tetrahydrocannabinol - the chemical in marijuana responsible for the "HIGH"). Studies suggest that CBD may interact   with different biological target receptors in the body, including cannabinoid and other neurotransmitter receptors. As of 2018 the mechanism of action for its biological effects has not been determined.  Side-effects   Adverse reactions: Dry mouth, diarrhea, decreased appetite, fatigue, drowsiness, malaise, weakness, sleep disturbances, and others.  Drug interactions: CBC may interact with other medications such as blood-thinners. (Last update: 01/24/2020) ____________________________________________________________________________________________   ____________________________________________________________________________________________  Medication Rules  Purpose: To inform patients, and their family members, of our rules and regulations.  Applies to: All patients receiving prescriptions (written or electronic).  Pharmacy of record: Pharmacy where electronic prescriptions will be sent. If written prescriptions are taken to a different pharmacy, please inform the nursing staff. The pharmacy listed in the electronic medical record should be the one where you would like electronic prescriptions to be sent.  Electronic prescriptions: In compliance with the [place] Strengthen Opioid Misuse Prevention (STOP) Act of 2017 (Session Law 2017-74/H243), effective June 19, 2018, all controlled substances must be electronically prescribed. Calling prescriptions to the pharmacy will cease to exist.  Prescription refills: Only during scheduled appointments. Applies to all prescriptions.  NOTE: The following applies primarily to controlled substances (Opioid* Pain Medications).   Type of encounter (visit): For patients receiving controlled substances, face-to-face visits are required. (Not an option or up to the patient.)  Patient's responsibilities: 1. Pain Pills: Bring all pain pills to every appointment (except for procedure appointments). 2. Pill Bottles: Bring pills in original pharmacy bottle. Always bring the newest bottle. Bring bottle, even if empty. 3. Medication refills: You are responsible for knowing and keeping track of what medications you take and those you need refilled. The day before  your appointment: write a list of all prescriptions that need to be refilled. The day of the appointment: give the list to the admitting nurse. Prescriptions will be written only during appointments. No prescriptions will be written on procedure days. If you forget a medication: it will not be "Called in", "Faxed", or "electronically sent". You will need to get another appointment to get these prescribed. No early refills. Do not call asking to have your prescription filled early. 4. Prescription Accuracy: You are responsible for carefully inspecting your prescriptions before leaving our office. Have the discharge nurse carefully go over each prescription with you, before taking them home. Make sure that your name is accurately spelled, that your address is correct. Check the name and dose of your medication to make sure it is accurate. Check the number of pills, and the written instructions to make sure they are clear and accurate. Make sure that you are given enough medication to last until your next medication refill appointment. 5. Taking Medication: Take medication as prescribed. When it comes to controlled substances, taking less pills or less frequently than prescribed is permitted and encouraged. Never take more pills than instructed. Never take medication more frequently than prescribed.  6. Inform other Doctors: Always inform, all of your healthcare providers, of all the medications you take. 7. Pain Medication from other Providers: You are not allowed to accept any additional pain medication from any other Doctor or Healthcare provider. There are two exceptions to this rule. (see below) In the event that you require additional pain medication, you are responsible for notifying us, as stated below. 8. Cough Medicine: Often these contain an opioid, such as codeine or hydrocodone. Never accept or take cough medicine containing these opioids if you are already taking an opioid* medication. The  combination may cause respiratory failure and   death. 9. Medication Agreement: You are responsible for carefully reading and following our Medication Agreement. This must be signed before receiving any prescriptions from our practice. Safely store a copy of your signed Agreement. Violations to the Agreement will result in no further prescriptions. (Additional copies of our Medication Agreement are available upon request.) 10. Laws, Rules, & Regulations: All patients are expected to follow all Federal and State Laws, Statutes, Rules, & Regulations. Ignorance of the Laws does not constitute a valid excuse.  11. Illegal drugs and Controlled Substances: The use of illegal substances (including, but not limited to marijuana and its derivatives) and/or the illegal use of any controlled substances is strictly prohibited. Violation of this rule may result in the immediate and permanent discontinuation of any and all prescriptions being written by our practice. The use of any illegal substances is prohibited. 12. Adopted CDC guidelines & recommendations: Target dosing levels will be at or below 60 MME/day. Use of benzodiazepines** is not recommended.  Exceptions: There are only two exceptions to the rule of not receiving pain medications from other Healthcare Providers. 1. Exception #1 (Emergencies): In the event of an emergency (i.e.: accident requiring emergency care), you are allowed to receive additional pain medication. However, you are responsible for: As soon as you are able, call our office [phone], at any time of the day or night, and leave a message stating your name, the date and nature of the emergency, and the name and dose of the medication prescribed. In the event that your call is answered by a member of our staff, make sure to document and save the date, time, and the name of the person that took your information.  2. Exception #2 (Planned Surgery): In the event that you are scheduled by  another doctor or dentist to have any type of surgery or procedure, you are allowed (for a period no longer than 30 days), to receive additional pain medication, for the acute post-op pain. However, in this case, you are responsible for picking up a copy of our "Post-op Pain Management for Surgeons" handout, and giving it to your surgeon or dentist. This document is available at our office, and does not require an appointment to obtain it. Simply go to our office during business hours (Monday-Thursday from 8:00 AM to 4:00 PM) (Friday 8:00 AM to 12:00 Noon) or if you have a scheduled appointment with us, prior to your surgery, and ask for it by name. In addition, you are responsible for: calling our office [phone], at any time of the day or night, and leaving a message stating your name, name of your surgeon, type of surgery, and date of procedure or surgery. Failure to comply with your responsibilities may result in termination of therapy involving the controlled substances.  *Opioid medications include: morphine, codeine, oxycodone, oxymorphone, hydrocodone, hydromorphone, meperidine, tramadol, tapentadol, buprenorphine, fentanyl, methadone. **Benzodiazepine medications include: diazepam (Valium), alprazolam (Xanax), clonazepam (Klonopine), lorazepam (Ativan), clorazepate (Tranxene), chlordiazepoxide (Librium), estazolam (Prosom), oxazepam (Serax), temazepam (Restoril), triazolam (Halcion) (Last updated: 05/17/2020) ____________________________________________________________________________________________    

## 2020-11-03 ENCOUNTER — Other Ambulatory Visit: Payer: Self-pay | Admitting: Family Medicine

## 2020-11-03 DIAGNOSIS — F333 Major depressive disorder, recurrent, severe with psychotic symptoms: Secondary | ICD-10-CM

## 2020-11-18 ENCOUNTER — Other Ambulatory Visit: Payer: Self-pay | Admitting: Family Medicine

## 2020-11-18 DIAGNOSIS — E782 Mixed hyperlipidemia: Secondary | ICD-10-CM

## 2020-11-18 NOTE — Telephone Encounter (Signed)
Requested medication (s) are due for refill today: yes   Requested medication (s) are on the active medication list: yes   Last refill:  08/11/2020  Future visit scheduled: no   Notes to clinic:  Patient is due for labs  Review for refill   Requested Prescriptions  Pending Prescriptions Disp Refills   fenofibrate (TRICOR) 145 MG tablet [Pharmacy Med Name: FENOFIBRATE 145 MG TAB] 90 tablet 1    Sig: TAKE 1 TABLET BY MOUTH ONCE DAILY      Cardiovascular:  Antilipid - Fibric Acid Derivatives Failed - 11/18/2020  8:31 AM      Failed - Total Cholesterol in normal range and within 360 days    No results found for: CHOL, POCCHOL, CHOLTOT        Failed - LDL in normal range and within 360 days    No results found for: LDLCALC, LDLC, HIRISKLDL, POCLDL, LDLDIRECT, REALLDLC, TOTLDLC        Failed - HDL in normal range and within 360 days    No results found for: HDL, POCHDL        Failed - Triglycerides in normal range and within 360 days    Triglycerides  Date Value Ref Range Status  03/30/2019 228 (H) <150 mg/dL Final    Comment:    Performed at [facility], [address] [place], [place] [address]          Failed - ALT in normal range and within 180 days    ALT  Date Value Ref Range Status  02/08/2020 29 0 - 44 U/L Final          Failed - AST in normal range and within 180 days    AST  Date Value Ref Range Status  02/08/2020 28 15 - 41 U/L Final          Failed - Cr in normal range and within 180 days    Creatinine, Ser  Date Value Ref Range Status  02/09/2020 1.04 0.61 - 1.24 mg/dL Final   Creatinine, Urine  Date Value Ref Range Status  12/13/2019 631 mg/dL Final    Comment:    RESULTS CONFIRMED BY MANUAL DILUTION Performed at [facility], [address] [place], [place] [address]           Failed - eGFR in normal range and within 180 days    GFR calc Af Amer  Date Value Ref Range Status  02/09/2020 >60 >60 mL/min Final   GFR  calc non Af Amer  Date Value Ref Range Status  02/09/2020 >60 >60 mL/min Final          Passed - Valid encounter within last 12 months    Recent Outpatient Visits           2 months ago Seasonal allergic rhinitis due to other allergic trigger   [place], DO   2 months ago COPD with acute exacerbation [facility])   [place], DO   3 months ago Upper respiratory tract infection, unspecified type   [place], NP   4 months ago Recurrent UTI   [place], DO   8 months ago Chronic pain syndrome   [place], DO       Future Appointments             In 8 months [place]  [place]

## 2020-11-23 ENCOUNTER — Other Ambulatory Visit: Payer: Self-pay | Admitting: Family Medicine

## 2020-11-23 DIAGNOSIS — J3089 Other allergic rhinitis: Secondary | ICD-10-CM

## 2020-11-23 DIAGNOSIS — R351 Nocturia: Secondary | ICD-10-CM

## 2020-11-23 DIAGNOSIS — R399 Unspecified symptoms and signs involving the genitourinary system: Secondary | ICD-10-CM

## 2020-12-15 ENCOUNTER — Other Ambulatory Visit: Payer: Self-pay | Admitting: Family Medicine

## 2020-12-15 DIAGNOSIS — I10 Essential (primary) hypertension: Secondary | ICD-10-CM

## 2020-12-15 DIAGNOSIS — F332 Major depressive disorder, recurrent severe without psychotic features: Secondary | ICD-10-CM

## 2020-12-15 NOTE — Telephone Encounter (Signed)
Requested medication (s) are due for refill today:  Yes  Requested medication (s) are on the active medication list: Yes  Last refill:  06/14/20  Future visit scheduled: No  Notes to clinic:  Unable to refill per protocol, cannot delegate.      Requested Prescriptions  Pending Prescriptions Disp Refills   risperiDONE (RISPERDAL) 0.5 MG tablet [Pharmacy Med Name: RISPERIDONE 0.5 MG TAB] 180 tablet 0    Sig: TAKE 1 TABLET BY MOUTH TWICE DAILY      Not Delegated - Psychiatry:  Antipsychotics - Second Generation (Atypical) - risperidone Failed - 12/15/2020 12:54 PM      Failed - This refill cannot be delegated      Failed - Prolactin Level (serum) in normal range and within 180 days    No results found for: PROLACTIN, TOTPROLACTIN, LABPROL        Failed - ALT in normal range and within 180 days    ALT  Date Value Ref Range Status  02/08/2020 29 0 - 44 U/L Final          Failed - AST in normal range and within 180 days    AST  Date Value Ref Range Status  02/08/2020 28 15 - 41 U/L Final          Passed - Valid encounter within last 6 months    Recent Outpatient Visits           2 months ago Seasonal allergic rhinitis due to other allergic trigger   [facility] Olin Hauser, DO   3 months ago COPD with acute exacerbation [facility])   [facility] Olin Hauser, DO   4 months ago Upper respiratory tract infection, unspecified type   [facility] Jon Billings, NP   5 months ago Recurrent UTI   [facility] Olin Hauser, DO   9 months ago Chronic pain syndrome   [place], DO       Future Appointments             In 7 months [facility],  [facility]               Signed Prescriptions Disp Refills   amLODipine (NORVASC) 10 MG tablet 90 tablet 0    Sig: TAKE 1 TABLET BY MOUTH ONCE DAILY      Cardiovascular:  Calcium  Channel Blockers Passed - 12/15/2020 12:54 PM      Passed - Last BP in normal range    BP Readings from Last 1 Encounters:  09/28/20 105/65          Passed - Valid encounter within last 6 months    Recent Outpatient Visits           2 months ago Seasonal allergic rhinitis due to other allergic trigger   [place], DO   3 months ago COPD with acute exacerbation [facility])   [place], DO   4 months ago Upper respiratory tract infection, unspecified type   [place], NP   5 months ago Recurrent UTI   [place], DO   9 months ago Chronic pain syndrome   [place], DO       Future Appointments             In  7 months [facility]

## 2020-12-31 ENCOUNTER — Ambulatory Visit: Payer: Self-pay

## 2020-12-31 NOTE — Telephone Encounter (Signed)
      Message from Sharene Skeans sent at 12/31/2020  2:56 PM EDT  Summary: medication question   Pt has been taking Z Quil for months for his insomnia and his daughter thinks he is dependent on it and wants to know what to do / she wants to know if she can just take it so he doesn't take it anymore or if he should see Dr. Raliegh Ip first/ please advise             Call History   Type Contact Phone/Fax User  12/31/2020 02:54 PM EDT Phone (Incoming) [name] (Emergency Contact) [phone] Sharene Skeans    Reason for Disposition  Prescription request for new medicine (not a refill)  Answer Assessment - Initial Assessment Questions 1. NAME of MEDICATION: "What medicine are you calling about?"     OTC - Z Quil 2. QUESTION: "What is your question?" (e.g., double dose of medicine, side effect)     Has been taking for several months 3. PRESCRIBING HCP: "Who prescribed it?" Reason: if prescribed by specialist, call should be referred to that group.     Not prescribed. 4. SYMPTOMS: "Do you have any symptoms?"     No 5. SEVERITY: If symptoms are present, ask "Are they mild, moderate or severe?"     N/a 6. PREGNANCY:  "Is there any chance that you are pregnant?" "When was your last menstrual period?"     N/a  Protocols used: Medication Question Call-A-AH

## 2020-12-31 NOTE — Telephone Encounter (Signed)
Pt.'s daughter reports pt. Has been taking OTC Z Quil for months to help sleep. States he is taking something prescribed as well. States they have taken the Z Quil away and are concerned if that is safe to do. Wants to know from PCP if pt. Needs in office visit or if virtual visit would suffice. Please advise.

## 2020-12-31 NOTE — Telephone Encounter (Signed)
He is on Trazodone, Seroquel, Lorazepam, Hydroxyzine that can help insomnia.  I would be very cautious with OTC Z Quil because it is basically benadryl, same as the Hydroxyzine, and may not be ideal for long term insomnia, if he is also using the other medications. It is not necessarily dangerous but could cause some side effects / confusion etc.  He can follow up if needed or can trial off Z Quil. Make sure has other meds  [name], [facility] 12/31/2020, 4:24 PM

## 2021-01-04 ENCOUNTER — Other Ambulatory Visit: Payer: Self-pay | Admitting: Family Medicine

## 2021-01-04 DIAGNOSIS — F333 Major depressive disorder, recurrent, severe with psychotic symptoms: Secondary | ICD-10-CM

## 2021-01-24 ENCOUNTER — Other Ambulatory Visit: Payer: Self-pay | Admitting: Family Medicine

## 2021-01-24 ENCOUNTER — Encounter: Payer: Medicare Other | Admitting: Pain Medicine

## 2021-01-24 DIAGNOSIS — F410 Panic disorder [episodic paroxysmal anxiety] without agoraphobia: Secondary | ICD-10-CM

## 2021-01-24 DIAGNOSIS — F333 Major depressive disorder, recurrent, severe with psychotic symptoms: Secondary | ICD-10-CM

## 2021-01-24 MED ORDER — LORAZEPAM 0.5 MG PO TABS: ORAL_TABLET | ORAL | 2 refills | Status: DC

## 2021-01-24 NOTE — Telephone Encounter (Signed)
  Notes to clinic:  Patient is moving and needs time to find a new provider  Review for 90 day supply   Requested Prescriptions  Pending Prescriptions Disp Refills   traZODone (DESYREL) 100 MG tablet [Pharmacy Med Name: TRAZODONE HCL 100 MG TAB] 90 tablet 1    Sig: TAKE 1 TABLET BY MOUTH AT BEDTIME      Psychiatry: Antidepressants - Serotonin Modulator Passed - 01/24/2021 10:04 AM      Passed - Completed PHQ-2 or PHQ-9 in the last 360 days      Passed - Valid encounter within last 6 months    Recent Outpatient Visits           4 months ago Seasonal allergic rhinitis due to other allergic trigger   [place], DO   4 months ago COPD with acute exacerbation [facility])   [facility] Olin Hauser, DO   6 months ago Upper respiratory tract infection, unspecified type   [place], NP   6 months ago Recurrent UTI   [place], DO   10 months ago Chronic pain syndrome   [facility] [place], Devonne Doughty, DO       Future Appointments             In 6 months [facility], [facility]

## 2021-01-24 NOTE — Telephone Encounter (Signed)
Requested medication (s) are due for refill today - yes  Requested medication (s) are on the active medication list -yes  Future visit scheduled -yes  Last refill: 10/15/20 #90 1 RF  Notes to clinic: Request RF- non delegated Rx  Requested Prescriptions  Pending Prescriptions Disp Refills   LORazepam (ATIVAN) 0.5 MG tablet 90 tablet 1      Not Delegated - Psychiatry:  Anxiolytics/Hypnotics Failed - 01/24/2021 10:11 AM      Failed - This refill cannot be delegated      Failed - Urine Drug Screen completed in last 360 days      Passed - Valid encounter within last 6 months    Recent Outpatient Visits           4 months ago Seasonal allergic rhinitis due to other allergic trigger   [place], Devonne Doughty, DO   4 months ago COPD with acute exacerbation [facility])   [facility] Olin Hauser, DO   6 months ago Upper respiratory tract infection, unspecified type   [facility] Jon Billings, NP   6 months ago Recurrent UTI   [name] Olin Hauser, DO   10 months ago Chronic pain syndrome   [place], DO       Future Appointments             In 6 months [facility], [facility]                  Requested Prescriptions  Pending Prescriptions Disp Refills   LORazepam (ATIVAN) 0.5 MG tablet 90 tablet 1      Not Delegated - Psychiatry:  Anxiolytics/Hypnotics Failed - 01/24/2021 10:11 AM      Failed - This refill cannot be delegated      Failed - Urine Drug Screen completed in last 360 days      Passed - Valid encounter within last 6 months    Recent Outpatient Visits           4 months ago Seasonal allergic rhinitis due to other allergic trigger   [place], DO   4 months ago COPD with acute exacerbation [facility])   [facility] [place], Devonne Doughty, DO   6  months ago Upper respiratory tract infection, unspecified type   [place], NP   6 months ago Recurrent UTI   [place], DO   10 months ago Chronic pain syndrome   [place], Devonne Doughty, DO       Future Appointments             In 6 months [facility], [facility]

## 2021-01-24 NOTE — Telephone Encounter (Signed)
Patient is asking for 3 month supply of traZODone (DESYREL) 100 MG tablet , because he is moving and needs time to find another pcp.

## 2021-01-24 NOTE — Progress Notes (Signed)
PROVIDER NOTE: Information contained herein reflects review and annotations entered in association with encounter. Interpretation of such information and data should be left to medically-trained personnel. Information provided to patient can be located elsewhere in the medical record under "Patient Instructions". Document created using STT-dictation technology, any transcriptional errors that may result from process are unintentional.    Patient: Aaron Gibbs  Service Category: E/M  Provider: Gaspar Cola, MD  DOB: Oct 20, 1934  DOS: 01/25/2021  Specialty: Interventional Pain Management  MRN: [id number]  Setting: Ambulatory outpatient  PCP: Olin Hauser, DO  Type: Established Patient    Referring Provider: [name] *  Location: Office  Delivery: Face-to-face     HPI  [name], a 86 y.o. year old male, is here today because of his Chronic pain of left knee [M25.562, G89.29]. Mr. Karczewski primary complain today is Knee Pain (bilateral) Last encounter: My last encounter with him was on 09/28/2020. Pertinent problems: Mr. Sagar has Chronic pain syndrome; DISH (diffuse idiopathic skeletal hyperostosis); Osteoarthritis of multiple joints; Chronic knee pain (1ry area of Pain) (Bilateral) (L>R); Chronic low back pain (Bilateral) w/o sciatica; Somatic symptom disorder; Tricompartment osteoarthritis of knee (Left); Osteoarthritis of knee (Bilateral); Osteoarthritis of patellofemoral joint (Right); Neurogenic pain; Lateral meniscal tear, sequela (Left); Acute postoperative pain; Medial meniscal tear, sequela (Left); Patellar tendinosis (Right); Osteoarthritis of knee (Left); Abnormal MRI, knee (08/05/2018) (Left); and Chronic knee pain (Left) on their pertinent problem list. Pain Assessment: Severity of Chronic pain is reported as a 6 /10. Location: Knee Right, Left/denies. Onset: More than a month ago. Quality: Burning, Aching, Constant. Timing: Constant. Modifying  factor(s): Patches help,gel. Vitals:  height is _0  (1.778 m) and weight is 200 lb (90.7 kg). His temporal temperature is 97.2 F (36.2 C) (abnormal). His blood pressure is 149/84 (abnormal) and his pulse is 85. His respiration is 18 and oxygen saturation is 97%.   Reason for encounter: medication management.   The patient indicates doing well with the current medication regimen. No adverse reactions or side effects reported to the medications.   UDS ordered today.   RTCB: 05/18/2021 Nonopioid transferred 11/02/2020: Voltaren gel  Pharmacotherapy Assessment  Analgesic: No opioid analgesics prescribed by our practice.  Buprenorphine 10 mcg/h patch q. 7 days.  High risk for SUD.  History of suicidal attempts and noncompliance with medication intake. ED visits thought to be due to medication binging. MME/day: 0 mg/day.   Monitoring: [place] PMP: PDMP reviewed during this encounter.       Pharmacotherapy: No side-effects or adverse reactions reported. Compliance: No problems identified. Effectiveness: Clinically acceptable.  Ignatius Specking, RN  01/25/2021 12:48 PM  Sign when Signing Visit Nursing Pain Medication Assessment:  Safety precautions to be maintained throughout the outpatient stay will include: orient to surroundings, keep bed in low position, maintain call bell within reach at all times, provide assistance with transfer out of bed and ambulation.  Medication Inspection Compliance: Pill count conducted under aseptic conditions, in front of the patient. Neither the pills nor the bottle was removed from the patient's sight at any time. Once count was completed pills were immediately returned to the patient in their original bottle.  Medication: Buprenorphine (Suboxone) Pill/Patch Count:  0 of 4 pills remain Pill/Patch Appearance: No markings Bottle Appearance: Standard pharmacy container. Clearly labeled. Filled Date: 6 / 30 / 2022 Last Medication intake:   Wearing last patch     UDS:  Summary  Date Value Ref Range Status  02/02/2020 Note  Final    Comment:    ==================================================================== ToxASSURE Select 13 (MW) ==================================================================== Test                             Result       Flag       Units  Drug Present and Declared for Prescription Verification   Buprenorphine                  14           EXPECTED   ng/mg creat   Norbuprenorphine               22           EXPECTED   ng/mg creat    Source of buprenorphine is a scheduled prescription medication.    Norbuprenorphine is an expected metabolite of buprenorphine.  Drug Present not Declared for Prescription Verification   Oxazepam                       21           UNEXPECTED ng/mg creat    Oxazepam may be administered as a scheduled prescription medication;    it is also an expected metabolite of other benzodiazepine drugs,    including diazepam, chlordiazepoxide, prazepam, clorazepate,    halazepam, and temazepam.    Lorazepam                      730          UNEXPECTED ng/mg creat    Source of lorazepam is a scheduled prescription medication.  ==================================================================== Test                      Result    Flag   Units      Ref Range   Creatinine              105              mg/dL      >=20 ==================================================================== Declared Medications:  The flagging and interpretation on this report are based on the  following declared medications.  Unexpected results may arise from  inaccuracies in the declared medications.   **Note: The testing scope of this panel does not include small to  moderate amounts of these reported medications:   Buprenorphine Patch (BuTrans)   **Note: The testing scope of this panel does not include the  following reported medications:   Albuterol  Amlodipine  Aspirin  Diclofenac (Voltaren)  Duloxetine  (Cymbalta)  Fenofibrate (TriCor)  Iron  Multivitamin  Pantoprazole (Protonix)  Risperidone  Timolol  Trazodone  Umeclidinium (Anoro)  Vilanterol (Anoro) ==================================================================== For clinical consultation, please call [phone]. ====================================================================      ROS  Constitutional: Denies any fever or chills Gastrointestinal: No reported hemesis, hematochezia, vomiting, or acute GI distress Musculoskeletal: Denies any acute onset joint swelling, redness, loss of ROM, or weakness Neurological: No reported episodes of acute onset apraxia, aphasia, dysarthria, agnosia, amnesia, paralysis, loss of coordination, or loss of consciousness  Medication Review  Azelastine HCl, DULoxetine, LORazepam, QUEtiapine, acetaminophen, albuterol, amLODipine, buprenorphine, diclofenac Sodium, dorzolamide-timolol, fenofibrate, ferrous sulfate, fluticasone, hydrOXYzine, ibuprofen, montelukast, multivitamin with minerals, mupirocin cream, pantoprazole, prednisoLONE acetate, risperiDONE, tamsulosin, traMADol, and traZODone  History Review  Allergy: Mr. Furey is allergic to azithromycin. Drug: Mr. Glace  reports no history of drug use. Alcohol:  reports previous alcohol use. Tobacco:  reports that he has quit smoking. His smoking use included cigarettes. His smokeless tobacco use includes chew. Social: Mr. Leyh  reports that he has quit smoking. His smoking use included cigarettes. His smokeless tobacco use includes chew. He reports previous alcohol use. He reports that he does not use drugs. Medical:  has a past medical history of Anxiety, Cancer ([place]), COPD (chronic obstructive pulmonary disease) ([place]), Glaucoma, and Osteoporosis. Surgical: [name]  has a past surgical history that includes Cholecystectomy and Transurethral resection of prostate (N/A, 2008). Family: family history includes Depression in  his mother; Heart disease in his father.  Laboratory Chemistry Profile   Renal Lab Results  Component Value Date   BUN 25 (H) 02/09/2020   CREATININE 1.04 02/09/2020   LABCREA 631 12/13/2019   BCR 28 (H) 07/10/2018   GFRAA >60 02/09/2020   GFRNONAA >60 02/09/2020    Hepatic Lab Results  Component Value Date   AST 28 02/08/2020   ALT 29 02/08/2020   ALBUMIN 4.2 02/08/2020   ALKPHOS 48 02/08/2020   LIPASE 24 06/04/2018   AMMONIA 25 09/13/2019    Electrolytes Lab Results  Component Value Date   NA 136 02/09/2020   K 3.9 02/09/2020   CL 105 02/09/2020   CALCIUM 9.6 02/09/2020   MG 2.3 04/02/2019   PHOS 3.4 03/30/2019    Bone Lab Results  Component Value Date   VD25OH 26 08/15/2016   25OHVITD1 34 07/10/2018   25OHVITD2 <1.0 07/10/2018   25OHVITD3 34 07/10/2018    Inflammation (CRP: Acute Phase) (ESR: Chronic Phase) Lab Results  Component Value Date   CRP 5 07/10/2018   ESRSEDRATE 20 07/10/2018   LATICACIDVEN 1.1 12/13/2019         Note: Above Lab results reviewed.  Recent Imaging Review  DG PAIN CLINIC C-ARM 1-60 MIN NO REPORT Fluoro was used, but no Radiologist interpretation will be provided.  Please refer to "NOTES" tab for provider progress note. Note: Reviewed        Physical Exam  General appearance: Well nourished, well developed, and well hydrated. In no apparent acute distress Mental status: Alert, oriented x 3 (person, place, & time)       Respiratory: No evidence of acute respiratory distress Eyes: PERLA Vitals: BP (!) 149/84   Pulse 85   Temp (!) 97.2 F (36.2 C) (Temporal)   Resp 18   Ht _0  (1.778 m)   Wt 200 lb (90.7 kg)   SpO2 97%   BMI 28.70 kg/m  BMI: Estimated body mass index is 28.7 kg/m as calculated from the following:   Height as of this encounter: _1  (1.778 m).   Weight as of this encounter: 200 lb (90.7 kg). Ideal: Ideal body weight: 73 kg (160 lb 15 oz) Adjusted ideal body weight: 80.1 kg (176 lb 9  oz)  Assessment   Status Diagnosis  Controlled Controlled Controlled 1. Chronic knee pain (Left)   2. Lateral meniscal tear, sequela (Left)   3. Medial meniscal tear, sequela (Left)   4. Osteoarthritis of knee (Left)   5. Tricompartment osteoarthritis of knee (Left)   6. Chronic pain syndrome   7. Chronic low back pain (Bilateral) w/o sciatica   8. Pharmacologic therapy   9. Chronic use of opiate for therapeutic purpose   10. Encounter for medication management      Updated Problems: No problems updated.  Plan of Care  Problem-specific:  No problem-specific Assessment & Plan  notes found for this encounter.  Mr. Jomarion Mish has a current medication list which includes the following long-term medication(s): albuterol, amlodipine, azelastine hcl, [START ON 01/26/2021] buprenorphine, [START ON 02/23/2021] buprenorphine, [START ON 03/23/2021] buprenorphine, [START ON 04/20/2021] buprenorphine, diclofenac sodium, duloxetine, fenofibrate, ferrous sulfate, fluticasone, montelukast, pantoprazole, quetiapine, risperidone, and trazodone.  Pharmacotherapy (Medications Ordered): Meds ordered this encounter  Medications   buprenorphine (BUTRANS) 10 MCG/HR PTWK    Sig: Place 1 patch onto the skin once a week for 28 days. Apply only 1 patch at a time and alternate sites weekly.    Dispense:  4 patch    Refill:  0    Chronic Pain: STOP Act (Not applicable) Fill 1 day early if closed on refill date. Avoid benzodiazepines within 8 hours of opioids   buprenorphine (BUTRANS) 10 MCG/HR PTWK    Sig: Place 1 patch onto the skin once a week for 28 days. Apply only 1 patch at a time and alternate sites weekly.    Dispense:  4 patch    Refill:  0    Chronic Pain: STOP Act (Not applicable) Fill 1 day early if closed on refill date. Avoid benzodiazepines within 8 hours of opioids   buprenorphine (BUTRANS) 10 MCG/HR PTWK    Sig: Place 1 patch onto the skin once a week for 28 days. Apply only 1 patch at a  time and alternate sites weekly.    Dispense:  4 patch    Refill:  0    Chronic Pain: STOP Act (Not applicable) Fill 1 day early if closed on refill date. Avoid benzodiazepines within 8 hours of opioids   buprenorphine (BUTRANS) 10 MCG/HR PTWK    Sig: Place 1 patch onto the skin once a week for 28 days. Apply only 1 patch at a time and alternate sites weekly.    Dispense:  4 patch    Refill:  0    Chronic Pain: STOP Act (Not applicable) Fill 1 day early if closed on refill date. Avoid benzodiazepines within 8 hours of opioids    Orders:  Orders Placed This Encounter  Procedures   ToxASSURE Select 13 (MW), Urine    Volume: 30 ml(s). Minimum 3 ml of urine is needed. Document temperature of fresh sample. Indications: Long term (current) use of opiate analgesic (O27.741)    Order Specific Question:   Release to patient    Answer:   Immediate    Follow-up plan:   Return in about 4 months (around 05/18/2021) for (F2F-MM) E/M-day (M,W).     Interventional Therapies  Risk  Complexity Considerations:   Patient is legally blind. High risk for substance use disorder.  The patient has documented suicidal attempt using medications.  In addition, we have documented the patient to have a binge use pattern and been noncompliant with the instructions on how to use the medications.  This is a reason why went to a buprenorphine patch.   Planned  Pending:   Repeat bilateral genicular nerve RFA #3    Under consideration:   Palliative bilateral genicular nerve RFA maintenance treatments   Completed:   Palliative bilateral IA Hyalgan knee injections Sx2  Diagnostic bilateral genicular NB x1  Palliative right genicular nerve RFA x2 (04/06/2020)  Palliative left genicular nerve RFA x2 (03/23/2020)    Therapeutic  Palliative (PRN) options:   Palliative bilateral IA Hyalgan knee injections S3/N1  Diagnostic bilateral genicular NB #2  Palliative bilateral genicular nerve RFA #3      Recent  Visits Date Type Provider Dept  11/02/20 Telemedicine Milinda Pointer, MD [facility]  Showing recent visits within past 90 days and meeting all other requirements Today's Visits Date Type Provider Dept  01/25/21 Office Visit Milinda Pointer, MD [facility]  Showing today's visits and meeting all other requirements Future Appointments No visits were found meeting these conditions. Showing future appointments within next 90 days and meeting all other requirements I discussed the assessment and treatment plan with the patient. The patient was provided an opportunity to ask questions and all were answered. The patient agreed with the plan and demonstrated an understanding of the instructions.  Patient advised to call back or seek an in-person evaluation if the symptoms or condition worsens.  Duration of encounter: 30 minutes.  Note by: Gaspar Cola, MD Date: 01/25/2021; Time: 1:43 PM

## 2021-01-24 NOTE — Telephone Encounter (Signed)
Medication Refill - Medication: LORazepam (ATIVAN) 0.5 MG tablet   Has the patient contacted their pharmacy? yes (Agent: If no, request thathe patient contact the pharmacy for the refill.) (Agent: If yes, when and what did the pharmacy advise?)contact pcp Patient is asking for 3 month supply until he finds a new pcp because he is moving  Preferred Pharmacy (with phone number or street name):  [facility], [place]. Phone:  [phone]  Fax:  [phone]      Agent: Please be advised that RX refills may take up to 3 business days. We ask that you follow-up with your pharmacy.

## 2021-01-25 ENCOUNTER — Encounter: Payer: Self-pay | Admitting: Pain Medicine

## 2021-01-25 ENCOUNTER — Other Ambulatory Visit: Payer: Self-pay

## 2021-01-25 ENCOUNTER — Ambulatory Visit: Payer: Medicare Other | Attending: Pain Medicine | Admitting: Pain Medicine

## 2021-01-25 VITALS — BP 149/84 | HR 85 | Temp 97.2°F | Resp 18 | Ht 70.0 in | Wt 200.0 lb

## 2021-01-25 DIAGNOSIS — S83282S Other tear of lateral meniscus, current injury, left knee, sequela: Secondary | ICD-10-CM | POA: Diagnosis not present

## 2021-01-25 DIAGNOSIS — M1712 Unilateral primary osteoarthritis, left knee: Secondary | ICD-10-CM

## 2021-01-25 DIAGNOSIS — Z79891 Long term (current) use of opiate analgesic: Secondary | ICD-10-CM

## 2021-01-25 DIAGNOSIS — G894 Chronic pain syndrome: Secondary | ICD-10-CM | POA: Diagnosis present

## 2021-01-25 DIAGNOSIS — M545 Low back pain, unspecified: Secondary | ICD-10-CM

## 2021-01-25 DIAGNOSIS — G8929 Other chronic pain: Secondary | ICD-10-CM | POA: Diagnosis present

## 2021-01-25 DIAGNOSIS — Z79899 Other long term (current) drug therapy: Secondary | ICD-10-CM

## 2021-01-25 DIAGNOSIS — S83242S Other tear of medial meniscus, current injury, left knee, sequela: Secondary | ICD-10-CM

## 2021-01-25 DIAGNOSIS — M25562 Pain in left knee: Secondary | ICD-10-CM

## 2021-01-25 MED ORDER — BUPRENORPHINE 10 MCG/HR TD PTWK: 1.0000 | MEDICATED_PATCH | TRANSDERMAL | 0 refills | Status: DC

## 2021-01-25 NOTE — Progress Notes (Signed)
Nursing Pain Medication Assessment:  Safety precautions to be maintained throughout the outpatient stay will include: orient to surroundings, keep bed in low position, maintain call bell within reach at all times, provide assistance with transfer out of bed and ambulation.  Medication Inspection Compliance: Pill count conducted under aseptic conditions, in front of the patient. Neither the pills nor the bottle was removed from the patient's sight at any time. Once count was completed pills were immediately returned to the patient in their original bottle.  Medication: Buprenorphine (Suboxone) Pill/Patch Count:  0 of 4 pills remain Pill/Patch Appearance: No markings Bottle Appearance: Standard pharmacy container. Clearly labeled. Filled Date: 6 / 68 / 2022 Last Medication intake:   Wearing last patch

## 2021-01-28 ENCOUNTER — Telehealth: Payer: Self-pay | Admitting: Family Medicine

## 2021-01-28 DIAGNOSIS — M8949 Other hypertrophic osteoarthropathy, multiple sites: Secondary | ICD-10-CM

## 2021-01-28 DIAGNOSIS — M792 Neuralgia and neuritis, unspecified: Secondary | ICD-10-CM

## 2021-01-28 DIAGNOSIS — M159 Polyosteoarthritis, unspecified: Secondary | ICD-10-CM

## 2021-01-28 DIAGNOSIS — G894 Chronic pain syndrome: Secondary | ICD-10-CM

## 2021-01-28 DIAGNOSIS — M1712 Unilateral primary osteoarthritis, left knee: Secondary | ICD-10-CM

## 2021-01-28 NOTE — Telephone Encounter (Signed)
Pt's daughter called to share the fax for the facility   Fax: [phone]   Address: [address], [place], [place]

## 2021-01-28 NOTE — Telephone Encounter (Signed)
Patient needs a referral to [facility] [place]. Please put on referral that medical records will follow from [facility] pain clinic. Referral has to come from pcp per this office  Ph: [phone]

## 2021-01-28 NOTE — Addendum Note (Signed)
Addended by: Olin Hauser on: 01/28/2021 05:31 PM   Modules accepted: Orders

## 2021-01-28 NOTE — Telephone Encounter (Signed)
Referral sent.  Reason for Referral: chronic pain syndrome with osteoarthritis multiple joints, knee pain, followed by previous pain management, on medications with Butrans patch, nerve ablation procedures and injection therapy. Has been on opiates in past.   Has the referral been discussed with the patient?: yes   Designated contact for the referral if not the patient (name/phone number): patient / daughter Aaron Gibbs [phone]   Has the patient seen a specialist for this issue before?: yes   If so, who (practice/provider)? [facility] Dr Dossie Arbour   Does the patient have a provider or location preference for the referral?: Yes out of state   [facility] [place]  Fax: [phone]   Address: [name], [name], [name]   Would the patient like to see previous specialist if applicable?

## 2021-01-30 LAB — TOXASSURE SELECT 13 (MW), URINE

## 2021-02-10 ENCOUNTER — Other Ambulatory Visit: Payer: Self-pay | Admitting: Family Medicine

## 2021-02-10 ENCOUNTER — Encounter: Payer: Self-pay | Admitting: Family Medicine

## 2021-02-10 ENCOUNTER — Telehealth: Payer: Medicare Other | Admitting: Family Medicine

## 2021-02-10 ENCOUNTER — Other Ambulatory Visit: Payer: Self-pay

## 2021-02-10 VITALS — Ht 70.0 in | Wt 200.0 lb

## 2021-02-10 DIAGNOSIS — R351 Nocturia: Secondary | ICD-10-CM

## 2021-02-10 DIAGNOSIS — F333 Major depressive disorder, recurrent, severe with psychotic symptoms: Secondary | ICD-10-CM | POA: Diagnosis not present

## 2021-02-10 DIAGNOSIS — J3089 Other allergic rhinitis: Secondary | ICD-10-CM

## 2021-02-10 DIAGNOSIS — I10 Essential (primary) hypertension: Secondary | ICD-10-CM

## 2021-02-10 DIAGNOSIS — E782 Mixed hyperlipidemia: Secondary | ICD-10-CM

## 2021-02-10 DIAGNOSIS — S83282S Other tear of lateral meniscus, current injury, left knee, sequela: Secondary | ICD-10-CM

## 2021-02-10 DIAGNOSIS — F332 Major depressive disorder, recurrent severe without psychotic features: Secondary | ICD-10-CM

## 2021-02-10 DIAGNOSIS — G8929 Other chronic pain: Secondary | ICD-10-CM

## 2021-02-10 DIAGNOSIS — R399 Unspecified symptoms and signs involving the genitourinary system: Secondary | ICD-10-CM

## 2021-02-10 DIAGNOSIS — S83242S Other tear of medial meniscus, current injury, left knee, sequela: Secondary | ICD-10-CM

## 2021-02-10 DIAGNOSIS — M1712 Unilateral primary osteoarthritis, left knee: Secondary | ICD-10-CM

## 2021-02-10 MED ORDER — FENOFIBRATE 145 MG PO TABS: 145.0000 mg | ORAL_TABLET | Freq: Every day | ORAL | 0 refills | Status: DC

## 2021-02-10 MED ORDER — MONTELUKAST SODIUM 10 MG PO TABS: 10.0000 mg | ORAL_TABLET | Freq: Every day | ORAL | 0 refills | Status: DC

## 2021-02-10 MED ORDER — RISPERIDONE 0.5 MG PO TABS: 0.5000 mg | ORAL_TABLET | Freq: Two times a day (BID) | ORAL | 0 refills | Status: DC

## 2021-02-10 MED ORDER — TRAZODONE HCL 100 MG PO TABS: 100.0000 mg | ORAL_TABLET | Freq: Every day | ORAL | 0 refills | Status: DC

## 2021-02-10 MED ORDER — TAMSULOSIN HCL 0.4 MG PO CAPS: 0.4000 mg | ORAL_CAPSULE | Freq: Every day | ORAL | 0 refills | Status: DC

## 2021-02-10 MED ORDER — DULOXETINE HCL 20 MG PO CPEP: 80.0000 mg | ORAL_CAPSULE | Freq: Every day | ORAL | 0 refills | Status: DC

## 2021-02-10 MED ORDER — QUETIAPINE FUMARATE 200 MG PO TABS: 200.0000 mg | ORAL_TABLET | Freq: Every day | ORAL | 0 refills | Status: DC

## 2021-02-10 MED ORDER — DICLOFENAC SODIUM 1 % EX GEL: 2.0000 g | Freq: Every day | CUTANEOUS | 0 refills | Status: DC

## 2021-02-10 MED ORDER — AMLODIPINE BESYLATE 10 MG PO TABS: 10.0000 mg | ORAL_TABLET | Freq: Every day | ORAL | 0 refills | Status: DC

## 2021-02-10 NOTE — Progress Notes (Signed)
Virtual Visit via Telephone The purpose of this virtual visit is to provide medical care while limiting exposure to the novel coronavirus (COVID19) for both patient and office staff.  Consent was obtained for phone visit:  Yes.   Answered questions that patient had about telehealth interaction:  Yes.   I discussed the limitations, risks, security and privacy concerns of performing an evaluation and management service by telephone. I also discussed with the patient that there may be a patient responsible charge related to this service. The patient expressed understanding and agreed to proceed.  Patient Location: Home Provider Location: [facility] (Office)  Participants in virtual visit: - Patient: Aaron Gibbs - CMA: Orinda Kenner, CMA - Provider: Dr Parks Ranger  ---------------------------------------------------------------------- Chief Complaint  Patient presents with   Medication Refill    S: Reviewed CMA documentation. I have called patient and gathered additional HPI as follows:  Moving 02/24/21 to [place] all refills 90 day sent to [facility] today  See prior history on Mental Health.  No new concerns addressed today   Denies any fevers, chills, sweats, body ache, cough, shortness of breath, sinus pain or pressure, headache, abdominal pain, diarrhea  Past Medical History:  Diagnosis Date   Anxiety    Cancer ([place])    skin   COPD (chronic obstructive pulmonary disease) ([place])    Glaucoma    Osteoporosis    osteopenia   Social History   Tobacco Use   Smoking status: Former    Years: 20.00    Types: Cigarettes   Smokeless tobacco: Current    Types: Chew   Tobacco comments:    stopped 15 years ago  Vaping Use   Vaping Use: Never used  Substance Use Topics   Alcohol use: Not Currently    Comment: past   Drug use: Never    Current Outpatient Medications:    acetaminophen (TYLENOL) 500 MG tablet, Take 500 mg by mouth every  6 (six) hours as needed., Disp: , Rfl:    albuterol (VENTOLIN HFA) 108 (90 Base) MCG/ACT inhaler, INHALE 1-2 PUFFS INTO THE LUNGS EVERY 6 HOURS AS NEEDED, Disp: 8.5 g, Rfl: 3   Azelastine HCl 137 MCG/SPRAY SOLN, Place 1 spray into the nose daily., Disp: 30 mL, Rfl: 2   buprenorphine (BUTRANS) 10 MCG/HR PTWK, Place 1 patch onto the skin once a week for 28 days. Apply only 1 patch at a time and alternate sites weekly., Disp: 4 patch, Rfl: 0   [START ON 02/23/2021] buprenorphine (BUTRANS) 10 MCG/HR PTWK, Place 1 patch onto the skin once a week for 28 days. Apply only 1 patch at a time and alternate sites weekly., Disp: 4 patch, Rfl: 0   [START ON 03/23/2021] buprenorphine (BUTRANS) 10 MCG/HR PTWK, Place 1 patch onto the skin once a week for 28 days. Apply only 1 patch at a time and alternate sites weekly., Disp: 4 patch, Rfl: 0   [START ON 04/20/2021] buprenorphine (BUTRANS) 10 MCG/HR PTWK, Place 1 patch onto the skin once a week for 28 days. Apply only 1 patch at a time and alternate sites weekly., Disp: 4 patch, Rfl: 0   COSOPT 22.3-6.8 MG/ML ophthalmic solution, 1 drop 2 (two) times daily., Disp: , Rfl:    ferrous sulfate 325 (65 FE) MG tablet, Take 325 mg by mouth daily with breakfast., Disp: , Rfl:    hydrOXYzine (ATARAX/VISTARIL) 25 MG tablet, TAKE 1 TABLET BY MOUTH 4 TIMES DAILY, Disp: 120 tablet, Rfl: 2  ibuprofen (ADVIL) 200 MG tablet, Take 200 mg by mouth every 6 (six) hours as needed., Disp: , Rfl:    LORazepam (ATIVAN) 0.5 MG tablet, TAKE 1 TABLET BY MOUTH EVERY 6 HOURS AS NEEDED ANXIETY, Disp: 90 tablet, Rfl: 2   Multiple Vitamin (MULTIVITAMIN WITH MINERALS) TABS tablet, Take 1 tablet by mouth daily., Disp: , Rfl:    mupirocin cream (BACTROBAN) 2 %, Apply 1 application topically 2 (two) times daily. For 1-2 weeks as needed, skin sore, Disp: 15 g, Rfl: 2   pantoprazole (PROTONIX) 40 MG tablet, Take 1 tablet (40 mg total) by mouth daily., Disp: 90 tablet, Rfl: 3   prednisoLONE acetate (PRED  FORTE) 1 % ophthalmic suspension, Place 1 drop into the left eye as needed., Disp: , Rfl:    traMADol (ULTRAM) 50 MG tablet, Take 50 mg by mouth 3 (three) times daily as needed., Disp: , Rfl:    amLODipine (NORVASC) 10 MG tablet, Take 1 tablet (10 mg total) by mouth daily., Disp: 90 tablet, Rfl: 0   diclofenac Sodium (VOLTAREN) 1 % GEL, Apply 2 g topically at bedtime. Apply to both knees twice daily, Disp: 350 g, Rfl: 0   DULoxetine (CYMBALTA) 20 MG capsule, Take 4 capsules (80 mg total) by mouth daily., Disp: 360 capsule, Rfl: 0   fenofibrate (TRICOR) 145 MG tablet, Take 1 tablet (145 mg total) by mouth daily., Disp: 90 tablet, Rfl: 0   fluticasone (FLONASE) 50 MCG/ACT nasal spray, USE 2 SPRAYS INTO EACH NOSTRIL ONCE DAILY USE 4-6 WKS THEN STOP AND USE SEASONALLY/AS NEEDED, Disp: 16 g, Rfl: 3   montelukast (SINGULAIR) 10 MG tablet, Take 1 tablet (10 mg total) by mouth at bedtime., Disp: 90 tablet, Rfl: 0   QUEtiapine (SEROQUEL) 200 MG tablet, Take 1 tablet (200 mg total) by mouth at bedtime., Disp: 90 tablet, Rfl: 0   risperiDONE (RISPERDAL) 0.5 MG tablet, Take 1 tablet (0.5 mg total) by mouth 2 (two) times daily., Disp: 180 tablet, Rfl: 0   tamsulosin (FLOMAX) 0.4 MG CAPS capsule, Take 1 capsule (0.4 mg total) by mouth daily., Disp: 90 capsule, Rfl: 0   traZODone (DESYREL) 100 MG tablet, Take 1 tablet (100 mg total) by mouth at bedtime., Disp: 90 tablet, Rfl: 0  Depression screen [facility] 2/9 08/09/2020 07/27/2020 04/15/2020  Decreased Interest 0 0 0  Down, Depressed, Hopeless 0 0 0  PHQ - 2 Score 0 0 0  Altered sleeping - - -  Tired, decreased energy - - -  Change in appetite - - -  Feeling bad or failure about yourself  - - -  Trouble concentrating - - -  Moving slowly or fidgety/restless - - -  Suicidal thoughts - - -  PHQ-9 Score - - -  Difficult doing work/chores - - -  Some recent data might be hidden    GAD 7 : Generalized Anxiety Score 03/05/2020 05/27/2019 04/11/2019 07/12/2018   Nervous, Anxious, on Edge 3 2 (No Data) 1  Control/stop worrying 2 2 - 1  Worry too much - different things 2 2 - 1  Trouble relaxing 2 2 - 1  Restless 2 1 - 0  Easily annoyed or irritable 1 1 - 0  Afraid - awful might happen 3 0 - 1  Total GAD 7 Score 15 10 - 5  Anxiety Difficulty Somewhat difficult Somewhat difficult - Somewhat difficult    -------------------------------------------------------------------------- O: No physical exam performed due to remote telephone encounter.  Lab results reviewed.  Recent Results (  from the past 2160 hour(s))  ToxASSURE Select 13 (MW), Urine     Status: None   Collection Time: 01/26/21  1:00 PM  Result Value Ref Range   Summary Note     Comment: ==================================================================== ToxASSURE Select 13 (MW) ==================================================================== Test                             Result       Flag       Units  Drug Present and Declared for Prescription Verification   Lorazepam                      796          EXPECTED   ng/mg creat    Source of lorazepam is a scheduled prescription medication.    Buprenorphine                  3            EXPECTED   ng/mg creat    Sources of buprenorphine include scheduled prescription medications.  Drug Absent but Declared for Prescription Verification   Tramadol                       Not Detected UNEXPECTED ng/mg creat ==================================================================== Test                      Result    Flag   Units      Ref Range   Creatinine              156              mg/dL      >=20 ==================================================================== Declared Medications:  The flagging an d interpretation on this report are based on the  following declared medications.  Unexpected results may arise from  inaccuracies in the declared medications.   **Note: The testing scope of this panel includes these  medications:   Lorazepam (Ativan)  Tramadol (Ultram)   **Note: The testing scope of this panel does not include small to  moderate amounts of these reported medications:   Buprenorphine Patch (BuTrans)   **Note: The testing scope of this panel does not include the  following reported medications:   Acetaminophen (Tylenol)  Albuterol (Ventolin HFA)  Amlodipine (Norvasc)  Azelastine  Diclofenac (Voltaren)  Dorzolamide  Duloxetine (Cymbalta)  Fenofibrate (TriCor)  Fluticasone (Flonase)  Hydroxyzine (Atarax)  Ibuprofen (Advil)  Iron  Montelukast (Singulair)  Multivitamin  Mupirocin (Bactroban)  Pantoprazole (Protonix)  Prednisolone  Quetiapine (Seroquel)  Risperidone (Risperdal)  Tamsulosin (Flomax)  Trazodone (Desyrel) =================== ================================================= For clinical consultation, please call [phone]. ====================================================================     -------------------------------------------------------------------------- A&P:  Problem List Items Addressed This Visit     Essential hypertension   Depression - Primary   Depression, anxiety Mental health See med list - updated and refills sent today via refill encounter already Reviewed meds approved 90 day, he will be moving out of state within 2 weeks and establishing with new PCP in [place].  No orders of the defined types were placed in this encounter.   Follow-up: establish with new PCP in [place], 90 day refills sent locally he can transfer to other pharmacy in [place] when ready in future  Patient verbalizes understanding with the above medical recommendations including the limitation of remote medical advice.  Specific follow-up and call-back criteria were given  for patient to follow-up or seek medical care more urgently if needed.   - Time spent in direct consultation with patient on phone: 10 minutes   [name], [facility] 02/10/2021, 11:16 AM

## 2021-02-21 ENCOUNTER — Other Ambulatory Visit: Payer: Self-pay | Admitting: Family Medicine

## 2021-02-21 DIAGNOSIS — I10 Essential (primary) hypertension: Secondary | ICD-10-CM

## 2021-03-15 DIAGNOSIS — G8929 Other chronic pain: Secondary | ICD-10-CM | POA: Insufficient documentation

## 2021-03-15 DIAGNOSIS — M25561 Pain in right knee: Secondary | ICD-10-CM | POA: Insufficient documentation

## 2021-04-10 ENCOUNTER — Other Ambulatory Visit: Payer: Self-pay | Admitting: Family Medicine

## 2021-04-10 DIAGNOSIS — F333 Major depressive disorder, recurrent, severe with psychotic symptoms: Secondary | ICD-10-CM

## 2021-04-11 NOTE — Telephone Encounter (Signed)
Requested Prescriptions  Pending Prescriptions Disp Refills  . DULoxetine (CYMBALTA) 20 MG capsule [Pharmacy Med Name: DULOXETINE HCL DR 20 MG CAP] 360 capsule 0    Sig: TAKE 4 CAPSULES BY MOUTH ONCE DAILY     Psychiatry: Antidepressants - SNRI Failed - 04/10/2021 11:04 AM      Failed - Last BP in normal range    BP Readings from Last 1 Encounters:  01/25/21 (!) 149/84         Passed - Completed PHQ-2 or PHQ-9 in the last 360 days      Passed - Valid encounter within last 6 months    Recent Outpatient Visits          2 months ago Severe episode of recurrent major depressive disorder, with psychotic features [facility])   [place], DO   6 months ago Seasonal allergic rhinitis due to other allergic trigger   [facility], Devonne Doughty, DO   7 months ago COPD with acute exacerbation [facility])   [facility] Olin Hauser, DO   8 months ago Upper respiratory tract infection, unspecified type   [place], NP   9 months ago Recurrent UTI   [place], Devonne Doughty, DO

## 2021-05-18 ENCOUNTER — Encounter: Payer: Medicare Other | Admitting: Pain Medicine

## 2021-05-18 NOTE — Progress Notes (Deleted)
No show to appointment.

## 2021-07-08 ENCOUNTER — Telehealth: Payer: Self-pay | Admitting: Family Medicine

## 2021-07-08 NOTE — Telephone Encounter (Signed)
Left message for patient to call back and schedule the Medicare Annual Wellness Visit (AWV) virtually or by telephone.  Last AWV 07/27/20  Please schedule at anytime with [place].  40 minute appointment  Any questions, please call me at [phone]

## 2021-07-25 DIAGNOSIS — H903 Sensorineural hearing loss, bilateral: Secondary | ICD-10-CM | POA: Insufficient documentation

## 2021-08-02 ENCOUNTER — Ambulatory Visit: Payer: Medicare Other

## 2021-09-08 DIAGNOSIS — R109 Unspecified abdominal pain: Secondary | ICD-10-CM | POA: Insufficient documentation

## 2022-01-22 ENCOUNTER — Encounter: Payer: Self-pay | Admitting: Emergency Medicine

## 2022-01-22 ENCOUNTER — Emergency Department
Admission: EM | Admit: 2022-01-22 | Discharge: 2022-01-22 | Disposition: A | Discharge status: Home / Self Care | Payer: Medicare Other | Attending: Emergency Medicine | Admitting: Emergency Medicine

## 2022-01-22 ENCOUNTER — Emergency Department: Payer: Medicare Other

## 2022-01-22 ENCOUNTER — Other Ambulatory Visit: Payer: Self-pay

## 2022-01-22 DIAGNOSIS — R519 Headache, unspecified: Secondary | ICD-10-CM | POA: Insufficient documentation

## 2022-01-22 DIAGNOSIS — J449 Chronic obstructive pulmonary disease, unspecified: Secondary | ICD-10-CM | POA: Diagnosis not present

## 2022-01-22 DIAGNOSIS — R42 Dizziness and giddiness: Secondary | ICD-10-CM | POA: Insufficient documentation

## 2022-01-22 DIAGNOSIS — R531 Weakness: Secondary | ICD-10-CM | POA: Diagnosis present

## 2022-01-22 DIAGNOSIS — I509 Heart failure, unspecified: Secondary | ICD-10-CM | POA: Insufficient documentation

## 2022-01-22 DIAGNOSIS — N189 Chronic kidney disease, unspecified: Secondary | ICD-10-CM | POA: Diagnosis not present

## 2022-01-22 DIAGNOSIS — Z20822 Contact with and (suspected) exposure to covid-19: Secondary | ICD-10-CM | POA: Diagnosis not present

## 2022-01-22 DIAGNOSIS — N390 Urinary tract infection, site not specified: Secondary | ICD-10-CM | POA: Diagnosis not present

## 2022-01-22 DIAGNOSIS — R079 Chest pain, unspecified: Secondary | ICD-10-CM | POA: Diagnosis not present

## 2022-01-22 LAB — CBC WITH DIFFERENTIAL/PLATELET
Abs Immature Granulocytes: 0.01 10*3/uL (ref 0.00–0.07)
Basophils Absolute: 0 10*3/uL (ref 0.0–0.1)
Basophils Relative: 1 %
Eosinophils Absolute: 0.2 10*3/uL (ref 0.0–0.5)
Eosinophils Relative: 7 %
HCT: 36.1 % — ABNORMAL LOW (ref 39.0–52.0)
Hemoglobin: 10.8 g/dL — ABNORMAL LOW (ref 13.0–17.0)
Immature Granulocytes: 0 %
Lymphocytes Relative: 30 %
Lymphs Abs: 1 10*3/uL (ref 0.7–4.0)
MCH: 25.5 pg — ABNORMAL LOW (ref 26.0–34.0)
MCHC: 29.9 g/dL — ABNORMAL LOW (ref 30.0–36.0)
MCV: 85.3 fL (ref 80.0–100.0)
Monocytes Absolute: 0.7 10*3/uL (ref 0.1–1.0)
Monocytes Relative: 22 %
Neutro Abs: 1.3 10*3/uL — ABNORMAL LOW (ref 1.7–7.7)
Neutrophils Relative %: 40 %
Platelets: 155 10*3/uL (ref 150–400)
RBC: 4.23 MIL/uL (ref 4.22–5.81)
RDW: 15.9 % — ABNORMAL HIGH (ref 11.5–15.5)
WBC: 3.4 10*3/uL — ABNORMAL LOW (ref 4.0–10.5)
nRBC: 0 % (ref 0.0–0.2)

## 2022-01-22 LAB — T4, FREE: Free T4: 0.97 ng/dL (ref 0.61–1.12)

## 2022-01-22 LAB — COMPREHENSIVE METABOLIC PANEL
ALT: 14 U/L (ref 0–44)
AST: 21 U/L (ref 15–41)
Albumin: 3.7 g/dL (ref 3.5–5.0)
Alkaline Phosphatase: 64 U/L (ref 38–126)
Anion gap: 6 (ref 5–15)
BUN: 24 mg/dL — ABNORMAL HIGH (ref 8–23)
CO2: 21 mmol/L — ABNORMAL LOW (ref 22–32)
Calcium: 9.1 mg/dL (ref 8.9–10.3)
Chloride: 114 mmol/L — ABNORMAL HIGH (ref 98–111)
Creatinine, Ser: 1.15 mg/dL (ref 0.61–1.24)
GFR, Estimated: >60 mL/min (ref >60)
Glucose, Bld: 119 mg/dL — ABNORMAL HIGH (ref 70–99)
Potassium: 3.8 mmol/L (ref 3.5–5.1)
Sodium: 141 mmol/L (ref 135–145)
Total Bilirubin: 0.5 mg/dL (ref 0.3–1.2)
Total Protein: 6.7 g/dL (ref 6.5–8.1)

## 2022-01-22 LAB — URINALYSIS, ROUTINE W REFLEX MICROSCOPIC
Bacteria, UA: NONE SEEN
Bilirubin Urine: NEGATIVE
Glucose, UA: NEGATIVE mg/dL
Hgb urine dipstick: NEGATIVE
Ketones, ur: NEGATIVE mg/dL
Nitrite: NEGATIVE
Protein, ur: 30 mg/dL — AB
Specific Gravity, Urine: 1.019 (ref 1.005–1.030)
pH: 5 (ref 5.0–8.0)

## 2022-01-22 LAB — TSH: TSH: 2.486 u[IU]/mL (ref 0.350–4.500)

## 2022-01-22 LAB — LIPASE, BLOOD: Lipase: 24 U/L (ref 11–51)

## 2022-01-22 LAB — TROPONIN I (HIGH SENSITIVITY)
Troponin I (High Sensitivity): 10 ng/L (ref <18)
Troponin I (High Sensitivity): 9 ng/L (ref <18)

## 2022-01-22 LAB — MAGNESIUM: Magnesium: 2 mg/dL (ref 1.7–2.4)

## 2022-01-22 LAB — SARS CORONAVIRUS 2 BY RT PCR: SARS Coronavirus 2 by RT PCR: NEGATIVE

## 2022-01-22 LAB — BRAIN NATRIURETIC PEPTIDE: B Natriuretic Peptide: 17.9 pg/mL (ref 0.0–100.0)

## 2022-01-22 MED ORDER — CEPHALEXIN 500 MG PO CAPS: 500.0000 mg | ORAL_CAPSULE | Freq: Two times a day (BID) | ORAL | 0 refills | Status: DC

## 2022-01-22 MED ORDER — CEPHALEXIN 500 MG PO CAPS: 500.0000 mg | ORAL_CAPSULE | Freq: Two times a day (BID) | ORAL | 0 refills | Status: AC

## 2022-01-22 MED ORDER — ONDANSETRON HCL 4 MG/2ML IJ SOLN
4.0000 mg | Freq: Once | INTRAMUSCULAR | Status: DC
  Filled 2022-01-22: qty 2

## 2022-01-22 NOTE — ED Provider Notes (Signed)
[facility] Provider Note    None    (approximate)   History   Weakness and Chest Pain   HPI  Aaron Gibbs is a 87 y.o. male  with COPD, depression, possible CHF who comes in with concerns for chest pain.  Patient reportedly was living in [place] and was unable to take care of himself so family paid for a med transfer from [place].  He reportedly did not make any stops that he only had a few sips of soda and some crackers.  There was concern that he was having some chest pain today but he reports having this similar chest pain for days now.  He also reports days of dizziness and headaches.  He denies any abdominal pain.  Patient is blind at baseline.  He ambulates with a walker.  He denies any falls.  He does report a cough.   Physical Exam   Triage Vital Signs: Blood pressure (!) 128/58, pulse 65, temperature 98.1 F (36.7 C), temperature source Oral, resp. rate 20, height '5\' 10"'$  (1.778 m), weight 90.7 kg, SpO2 94 %.  Most recent vital signs: Vitals:   01/22/22 1046 01/22/22 1049  BP:  (!) 128/58  Pulse:  65  Resp:  20  Temp:  98.1 F (36.7 C)  SpO2: 94% 94%     General: Awake, no distress.  CV:  Good peripheral perfusion.  Resp:  Normal effort.  Abd:  No distention.  Soft and nontender Other:  Able to move his bilateral arms and legs.  No obvious deficits noted.  Sensation intact throughout.  Cranial nerves appear intact although patient has baseline blurry vision Some trace edema noted in the legs Patient is an area that was biopsied on the right arm without any surrounding erythema redness  ED Results / Procedures / Treatments   Labs (all labs ordered are listed, but only abnormal results are displayed) Labs Reviewed  COMPREHENSIVE METABOLIC PANEL - Abnormal; Notable for the following components:      Result Value   Chloride 114 (*)    CO2 21 (*)    Glucose, Bld 119 (*)    BUN 24 (*)    All other components within normal limits   URINALYSIS, ROUTINE W REFLEX MICROSCOPIC - Abnormal; Notable for the following components:   Color, Urine YELLOW (*)    APPearance HAZY (*)    Protein, ur 30 (*)    Leukocytes,Ua MODERATE (*)    All other components within normal limits  CBC WITH DIFFERENTIAL/PLATELET - Abnormal; Notable for the following components:   WBC 3.4 (*)    Hemoglobin 10.8 (*)    HCT 36.1 (*)    MCH 25.5 (*)    MCHC 29.9 (*)    RDW 15.9 (*)    Neutro Abs 1.3 (*)    All other components within normal limits  SARS CORONAVIRUS 2 BY RT PCR  LIPASE, BLOOD  BRAIN NATRIURETIC PEPTIDE  MAGNESIUM  TSH  T4, FREE  CBC WITH DIFFERENTIAL/PLATELET  TROPONIN I (HIGH SENSITIVITY)  TROPONIN I (HIGH SENSITIVITY)     EKG  My interpretation of EKG:  Sinus versus ectopic beat rate of 67 without any ST elevation or T wave inversions, normal intervals   RADIOLOGY I have reviewed the xray personally and interpreted no evidence of any pneumonia  PROCEDURES:  Critical Care performed: No  .1-3 Lead EKG Interpretation  Performed by: Vanessa [place], MD Authorized by: Vanessa McCracken, MD  Interpretation: normal     ECG rate:  60   ECG rate assessment: normal     Rhythm: sinus rhythm     Ectopy: none     Conduction: normal      MEDICATIONS ORDERED IN ED: Medications - No data to display   IMPRESSION / MDM / [place] / ED COURSE  I reviewed the triage vital signs and the nursing notes.   Patient's presentation is most consistent with acute presentation with potential threat to life or bodily function.   Differential is pneumonia, COVID, ACS, UTI, electrolyte abnormalities.  We will start off with broad work-up and also get CT head given the headache.  We will get cardiac markers to evaluate for ACS.  Patient urine with WBC +LE will send for culture- does not meet sepsis criteria  COVID test is negative CBC shows low white count but has had that previously a year ago.  Hemoglobin was slightly  low but a rectal exam showed brown stool Hemoccult negative.  CMP overall reassuring.  Troponin was negative lipase was negative BNP was normal patient does not look fluid overloaded.  Thyroid is normal.    12:58 PM  D/w daughter [name]- initially moved to [place] to live with a different sister.  He has a history of COPD, CHF, CKD, impaired vision.  They report that he did have a psychiatric hospitalization 1 time.  She denies any concerns from a psychiatric point but that he has been complaining of chronic chest pain and chronic weakness for weeks to months and has been to the [facility] multiple times with full work-ups have been unrevealing.  Days she feels that the drive here probably did not help since he did not get any meals.  She stated that the agreed to do a generalized work-up just to be safe that there was nothing else going on but they work-up was reassuring that they feel comfortable with patient getting EMS transport back home   We will get repeat troponin, give patient some food and ambulate.  Repeat troponin is negative.  He has been up and ambulatory with a walker.  On repeat abdominal exam remains soft and nontender.  Denies any shortness of breath to suggest PE.  I again discussed with patient's daughter in regard to start on some antibiotics for possible UTI.  We discussed further imaging but at this time this seems more like a chronic issue has been going on for weeks.  He denied any SI and she denies any concerns for SI but she does feel like some of this could just be the depression and that they are hoping that the change in environment will help with this.  At this time they feel comfortable with patient being discharged home  Patient has been up and ambulatory with walker and tolerated drinking  The patient is on the cardiac monitor to evaluate for evidence of arrhythmia and/or significant heart rate changes.      FINAL CLINICAL IMPRESSION(S) / ED DIAGNOSES   Final  diagnoses:  Weakness  Urinary tract infection without hematuria, site unspecified     Rx / DC Orders   ED Discharge Orders          Ordered    cephALEXin (KEFLEX) 500 MG capsule  2 times daily        01/22/22 1446             Note:  This document was prepared using Dragon voice recognition software and  may include unintentional dictation errors.   Vanessa [place], MD 01/22/22 [phone]

## 2022-01-22 NOTE — ED Notes (Signed)
Pt alert, IV removed, pt given discharge instructions, pt left with EMS on stretcher.

## 2022-01-22 NOTE — ED Triage Notes (Signed)
Per [facility] pt coming from home- pt is blind and ambulatory with walker. Patient moved from [place] last night and is at house with care giver. Care giver called EMS for weakness. States 18hr trip yesterday and had no real meals. Pt c/o chest pain ongoing for the past couple weeks.

## 2022-01-22 NOTE — ED Notes (Signed)
Pt ambulate with walker approx 21f, pt was able to tolerate ambulating but did state that he starts getting weak after prolonged exertion.

## 2022-01-22 NOTE — ED Notes (Signed)
Called for tranport back home

## 2022-01-22 NOTE — Discharge Instructions (Addendum)
We are starting him on some antibiotics for possible UTI.  His blood work overall was pretty reassuring except for some slightly low hemoglobin but I did do a rectal exam and there is no evidence of bleeding.  Please return to the ER if he develops worsening symptoms or any other concerns

## 2022-01-24 LAB — URINE CULTURE: Culture: 50000 — AB

## 2022-01-29 ENCOUNTER — Emergency Department: Payer: Medicare Other

## 2022-01-29 ENCOUNTER — Other Ambulatory Visit: Payer: Self-pay

## 2022-01-29 ENCOUNTER — Observation Stay: Payer: Medicare Other

## 2022-01-29 ENCOUNTER — Observation Stay
Admission: EM | Admit: 2022-01-29 | Discharge: 2022-01-30 | Disposition: A | Discharge status: Home / Self Care | Payer: Medicare Other | Attending: Internal Medicine | Admitting: Internal Medicine

## 2022-01-29 DIAGNOSIS — F418 Other specified anxiety disorders: Secondary | ICD-10-CM | POA: Diagnosis present

## 2022-01-29 DIAGNOSIS — R531 Weakness: Secondary | ICD-10-CM

## 2022-01-29 DIAGNOSIS — R42 Dizziness and giddiness: Principal | ICD-10-CM | POA: Insufficient documentation

## 2022-01-29 DIAGNOSIS — H8110 Benign paroxysmal vertigo, unspecified ear: Secondary | ICD-10-CM

## 2022-01-29 DIAGNOSIS — R4182 Altered mental status, unspecified: Secondary | ICD-10-CM | POA: Diagnosis not present

## 2022-01-29 DIAGNOSIS — Z87891 Personal history of nicotine dependence: Secondary | ICD-10-CM | POA: Diagnosis not present

## 2022-01-29 DIAGNOSIS — K219 Gastro-esophageal reflux disease without esophagitis: Secondary | ICD-10-CM | POA: Diagnosis present

## 2022-01-29 DIAGNOSIS — F410 Panic disorder [episodic paroxysmal anxiety] without agoraphobia: Secondary | ICD-10-CM | POA: Diagnosis present

## 2022-01-29 DIAGNOSIS — E785 Hyperlipidemia, unspecified: Secondary | ICD-10-CM | POA: Insufficient documentation

## 2022-01-29 DIAGNOSIS — I10 Essential (primary) hypertension: Secondary | ICD-10-CM | POA: Diagnosis not present

## 2022-01-29 DIAGNOSIS — Z20822 Contact with and (suspected) exposure to covid-19: Secondary | ICD-10-CM | POA: Insufficient documentation

## 2022-01-29 DIAGNOSIS — J449 Chronic obstructive pulmonary disease, unspecified: Secondary | ICD-10-CM | POA: Insufficient documentation

## 2022-01-29 DIAGNOSIS — H5440 Blindness, one eye, unspecified eye: Secondary | ICD-10-CM

## 2022-01-29 DIAGNOSIS — J189 Pneumonia, unspecified organism: Secondary | ICD-10-CM

## 2022-01-29 DIAGNOSIS — Z85828 Personal history of other malignant neoplasm of skin: Secondary | ICD-10-CM | POA: Insufficient documentation

## 2022-01-29 DIAGNOSIS — Z79899 Other long term (current) drug therapy: Secondary | ICD-10-CM | POA: Diagnosis not present

## 2022-01-29 DIAGNOSIS — M1712 Unilateral primary osteoarthritis, left knee: Secondary | ICD-10-CM | POA: Diagnosis not present

## 2022-01-29 DIAGNOSIS — H409 Unspecified glaucoma: Secondary | ICD-10-CM

## 2022-01-29 DIAGNOSIS — H547 Unspecified visual loss: Secondary | ICD-10-CM

## 2022-01-29 DIAGNOSIS — F411 Generalized anxiety disorder: Secondary | ICD-10-CM | POA: Diagnosis present

## 2022-01-29 DIAGNOSIS — E782 Mixed hyperlipidemia: Secondary | ICD-10-CM

## 2022-01-29 LAB — URINALYSIS, ROUTINE W REFLEX MICROSCOPIC
Bilirubin Urine: NEGATIVE
Glucose, UA: NEGATIVE mg/dL
Hgb urine dipstick: NEGATIVE
Ketones, ur: NEGATIVE mg/dL
Leukocytes,Ua: NEGATIVE
Nitrite: NEGATIVE
Protein, ur: NEGATIVE mg/dL
Specific Gravity, Urine: 1.012 (ref 1.005–1.030)
pH: 5 (ref 5.0–8.0)

## 2022-01-29 LAB — CBC WITH DIFFERENTIAL/PLATELET
Abs Immature Granulocytes: 0.02 10*3/uL (ref 0.00–0.07)
Basophils Absolute: 0 10*3/uL (ref 0.0–0.1)
Basophils Relative: 0 %
Eosinophils Absolute: 0.2 10*3/uL (ref 0.0–0.5)
Eosinophils Relative: 4 %
HCT: 35.6 % — ABNORMAL LOW (ref 39.0–52.0)
Hemoglobin: 10.5 g/dL — ABNORMAL LOW (ref 13.0–17.0)
Immature Granulocytes: 0 %
Lymphocytes Relative: 18 %
Lymphs Abs: 0.9 10*3/uL (ref 0.7–4.0)
MCH: 25.1 pg — ABNORMAL LOW (ref 26.0–34.0)
MCHC: 29.5 g/dL — ABNORMAL LOW (ref 30.0–36.0)
MCV: 85.2 fL (ref 80.0–100.0)
Monocytes Absolute: 1 10*3/uL (ref 0.1–1.0)
Monocytes Relative: 21 %
Neutro Abs: 2.8 10*3/uL (ref 1.7–7.7)
Neutrophils Relative %: 57 %
Platelets: 106 10*3/uL — ABNORMAL LOW (ref 150–400)
RBC: 4.18 MIL/uL — ABNORMAL LOW (ref 4.22–5.81)
RDW: 15.9 % — ABNORMAL HIGH (ref 11.5–15.5)
WBC: 4.9 10*3/uL (ref 4.0–10.5)
nRBC: 0 % (ref 0.0–0.2)

## 2022-01-29 LAB — COMPREHENSIVE METABOLIC PANEL
ALT: 12 U/L (ref 0–44)
AST: 25 U/L (ref 15–41)
Albumin: 3.5 g/dL (ref 3.5–5.0)
Alkaline Phosphatase: 66 U/L (ref 38–126)
Anion gap: 6 (ref 5–15)
BUN: 23 mg/dL (ref 8–23)
CO2: 24 mmol/L (ref 22–32)
Calcium: 9 mg/dL (ref 8.9–10.3)
Chloride: 110 mmol/L (ref 98–111)
Creatinine, Ser: 1.16 mg/dL (ref 0.61–1.24)
GFR, Estimated: >60 mL/min (ref >60)
Glucose, Bld: 133 mg/dL — ABNORMAL HIGH (ref 70–99)
Potassium: 4.4 mmol/L (ref 3.5–5.1)
Sodium: 140 mmol/L (ref 135–145)
Total Bilirubin: 0.6 mg/dL (ref 0.3–1.2)
Total Protein: 6.6 g/dL (ref 6.5–8.1)

## 2022-01-29 LAB — LACTIC ACID, PLASMA
Lactic Acid, Venous: 1.3 mmol/L (ref 0.5–1.9)
Lactic Acid, Venous: 0.9 mmol/L (ref 0.5–1.9)

## 2022-01-29 LAB — SARS CORONAVIRUS 2 BY RT PCR: SARS Coronavirus 2 by RT PCR: NEGATIVE

## 2022-01-29 LAB — TROPONIN I (HIGH SENSITIVITY)
Troponin I (High Sensitivity): 7 ng/L (ref <18)
Troponin I (High Sensitivity): 7 ng/L (ref <18)

## 2022-01-29 LAB — PROCALCITONIN: Procalcitonin: <0.1 ng/mL

## 2022-01-29 MED ORDER — PANTOPRAZOLE SODIUM 40 MG PO TBEC
40.0000 mg | DELAYED_RELEASE_TABLET | Freq: Every day | ORAL | Status: DC
  Administered 2022-01-30: 40 mg via ORAL
  Filled 2022-01-29: qty 1

## 2022-01-29 MED ORDER — LORAZEPAM 0.5 MG PO TABS
0.5000 mg | ORAL_TABLET | Freq: Four times a day (QID) | ORAL | Status: DC | PRN
  Administered 2022-01-29 – 2022-01-30 (×2): 0.5 mg via ORAL
  Filled 2022-01-29 (×2): qty 1

## 2022-01-29 MED ORDER — ACETAMINOPHEN 325 MG PO TABS
650.0000 mg | ORAL_TABLET | Freq: Four times a day (QID) | ORAL | Status: DC | PRN
  Administered 2022-01-29 – 2022-01-30 (×3): 650 mg via ORAL
  Filled 2022-01-29 (×3): qty 2

## 2022-01-29 MED ORDER — DORZOLAMIDE HCL-TIMOLOL MAL 2-0.5 % OP SOLN
1.0000 [drp] | Freq: Two times a day (BID) | OPHTHALMIC | Status: DC
  Administered 2022-01-29 – 2022-01-30 (×2): 1 [drp] via OPHTHALMIC
  Filled 2022-01-29: qty 10

## 2022-01-29 MED ORDER — PREDNISOLONE ACETATE 1 % OP SUSP
1.0000 [drp] | Freq: Two times a day (BID) | OPHTHALMIC | Status: DC
Start: 2022-01-29 — End: 2022-01-29

## 2022-01-29 MED ORDER — BUPRENORPHINE 10 MCG/HR TD PTWK
1.0000 | MEDICATED_PATCH | TRANSDERMAL | Status: DC
  Administered 2022-01-29: 1 via TRANSDERMAL

## 2022-01-29 MED ORDER — ONDANSETRON HCL 4 MG/2ML IJ SOLN: 4.0000 mg | Freq: Four times a day (QID) | INTRAMUSCULAR | Status: DC | PRN

## 2022-01-29 MED ORDER — LOSARTAN POTASSIUM 50 MG PO TABS
50.0000 mg | ORAL_TABLET | Freq: Every day | ORAL | Status: DC
  Administered 2022-01-30: 50 mg via ORAL
  Filled 2022-01-29: qty 1

## 2022-01-29 MED ORDER — AMITRIPTYLINE HCL 10 MG PO TABS
10.0000 mg | ORAL_TABLET | Freq: Every day | ORAL | Status: DC
  Administered 2022-01-29: 10 mg via ORAL
  Filled 2022-01-29: qty 1

## 2022-01-29 MED ORDER — FENOFIBRATE 160 MG PO TABS
160.0000 mg | ORAL_TABLET | Freq: Every day | ORAL | Status: DC
  Administered 2022-01-30: 160 mg via ORAL
  Filled 2022-01-29: qty 1

## 2022-01-29 MED ORDER — ONDANSETRON HCL 4 MG PO TABS: 4.0000 mg | ORAL_TABLET | Freq: Four times a day (QID) | ORAL | Status: DC | PRN

## 2022-01-29 MED ORDER — MIRTAZAPINE 15 MG PO TABS
30.0000 mg | ORAL_TABLET | Freq: Every day | ORAL | Status: DC
Start: 2022-01-29 — End: 2022-01-29

## 2022-01-29 MED ORDER — CARVEDILOL 6.25 MG PO TABS
12.5000 mg | ORAL_TABLET | Freq: Two times a day (BID) | ORAL | Status: DC
  Administered 2022-01-29 – 2022-01-30 (×2): 12.5 mg via ORAL
  Filled 2022-01-29 (×2): qty 2

## 2022-01-29 MED ORDER — BUMETANIDE 0.5 MG PO TABS
0.5000 mg | ORAL_TABLET | Freq: Every evening | ORAL | Status: DC
  Administered 2022-01-29: 0.5 mg via ORAL
  Filled 2022-01-29 (×2): qty 1

## 2022-01-29 MED ORDER — SENNOSIDES-DOCUSATE SODIUM 8.6-50 MG PO TABS: 1.0000 | ORAL_TABLET | Freq: Every evening | ORAL | Status: DC | PRN

## 2022-01-29 MED ORDER — BUPRENORPHINE 10 MCG/HR TD PTWK: 1.0000 | MEDICATED_PATCH | TRANSDERMAL | Status: DC

## 2022-01-29 MED ORDER — ALBUTEROL SULFATE HFA 108 (90 BASE) MCG/ACT IN AERS: 2.0000 | INHALATION_SPRAY | Freq: Four times a day (QID) | RESPIRATORY_TRACT | Status: DC | PRN

## 2022-01-29 MED ORDER — TRAZODONE HCL 100 MG PO TABS
100.0000 mg | ORAL_TABLET | Freq: Every day | ORAL | Status: DC
Start: 2022-01-29 — End: 2022-01-29

## 2022-01-29 MED ORDER — PREDNISOLONE ACETATE 1 % OP SUSP
1.0000 [drp] | Freq: Two times a day (BID) | OPHTHALMIC | Status: DC
Start: 2022-01-29 — End: 2022-01-29
  Administered 2022-01-29: 1 [drp] via OPHTHALMIC
  Filled 2022-01-29: qty 5

## 2022-01-29 MED ORDER — TAMSULOSIN HCL 0.4 MG PO CAPS
0.4000 mg | ORAL_CAPSULE | Freq: Every day | ORAL | Status: DC
  Administered 2022-01-30: 0.4 mg via ORAL
  Filled 2022-01-29: qty 1

## 2022-01-29 MED ORDER — ENOXAPARIN SODIUM 40 MG/0.4ML IJ SOSY
40.0000 mg | PREFILLED_SYRINGE | Freq: Every day | INTRAMUSCULAR | Status: DC
  Administered 2022-01-30: 40 mg via SUBCUTANEOUS
  Filled 2022-01-29: qty 0.4

## 2022-01-29 MED ORDER — BUMETANIDE 1 MG PO TABS
1.0000 mg | ORAL_TABLET | Freq: Every morning | ORAL | Status: DC
Start: 2022-01-30 — End: 2022-01-30
  Administered 2022-01-30: 1 mg via ORAL
  Filled 2022-01-29: qty 1

## 2022-01-29 MED ORDER — ROSUVASTATIN CALCIUM 10 MG PO TABS
10.0000 mg | ORAL_TABLET | Freq: Every day | ORAL | Status: DC
  Filled 2022-01-29 (×2): qty 1

## 2022-01-29 MED ORDER — DOXYCYCLINE HYCLATE 100 MG PO TABS
100.0000 mg | ORAL_TABLET | Freq: Once | ORAL | Status: DC
  Administered 2022-01-29: 100 mg via ORAL
  Filled 2022-01-29: qty 1

## 2022-01-29 MED ORDER — DULOXETINE HCL 30 MG PO CPEP
90.0000 mg | ORAL_CAPSULE | Freq: Every day | ORAL | Status: DC
  Administered 2022-01-30: 90 mg via ORAL
  Filled 2022-01-29: qty 3

## 2022-01-29 MED ORDER — OLANZAPINE 2.5 MG PO TABS
2.5000 mg | ORAL_TABLET | Freq: Two times a day (BID) | ORAL | Status: DC
  Administered 2022-01-29 – 2022-01-30 (×2): 2.5 mg via ORAL
  Filled 2022-01-29 (×2): qty 1

## 2022-01-29 MED ORDER — DICLOFENAC SODIUM 1 % EX GEL
2.0000 g | Freq: Every evening | CUTANEOUS | Status: DC | PRN
  Administered 2022-01-30: 2 g via TOPICAL
  Filled 2022-01-29: qty 100

## 2022-01-29 MED ORDER — FUROSEMIDE 40 MG PO TABS
40.0000 mg | ORAL_TABLET | Freq: Two times a day (BID) | ORAL | Status: DC
Start: 2022-01-30 — End: 2022-01-29

## 2022-01-29 MED ORDER — ACETAMINOPHEN 650 MG RE SUPP: 650.0000 mg | Freq: Four times a day (QID) | RECTAL | Status: DC | PRN

## 2022-01-29 MED ORDER — SODIUM CHLORIDE 0.9 % IV SOLN
1.0000 g | Freq: Once | INTRAVENOUS | Status: DC
  Administered 2022-01-29: 1 g via INTRAVENOUS
  Filled 2022-01-29: qty 10

## 2022-01-29 MED ORDER — FINASTERIDE 5 MG PO TABS
5.0000 mg | ORAL_TABLET | Freq: Every day | ORAL | Status: DC
Start: 2022-01-30 — End: 2022-01-29

## 2022-01-29 MED ORDER — DORZOLAMIDE HCL-TIMOLOL MAL 2-0.5 % OP SOLN
1.0000 [drp] | Freq: Two times a day (BID) | OPHTHALMIC | Status: DC
  Filled 2022-01-29: qty 10

## 2022-01-29 MED ORDER — AMLODIPINE BESYLATE 5 MG PO TABS
10.0000 mg | ORAL_TABLET | Freq: Every day | ORAL | Status: DC
Start: 2022-01-29 — End: 2022-01-29
  Administered 2022-01-29: 10 mg via ORAL
  Filled 2022-01-29: qty 2

## 2022-01-29 MED ORDER — BUPRENORPHINE 10 MCG/HR TD PTWK
1.0000 | MEDICATED_PATCH | TRANSDERMAL | Status: DC
Start: 2022-02-04 — End: 2022-01-29

## 2022-01-29 MED ORDER — FAMOTIDINE 20 MG PO TABS
40.0000 mg | ORAL_TABLET | Freq: Two times a day (BID) | ORAL | Status: DC
  Administered 2022-01-30: 40 mg via ORAL
  Filled 2022-01-29: qty 2

## 2022-01-29 MED ORDER — ADULT MULTIVITAMIN W/MINERALS CH
1.0000 | ORAL_TABLET | Freq: Every day | ORAL | Status: DC
  Administered 2022-01-30: 1 via ORAL
  Filled 2022-01-29: qty 1

## 2022-01-29 MED ORDER — GABAPENTIN 300 MG PO CAPS
300.0000 mg | ORAL_CAPSULE | Freq: Three times a day (TID) | ORAL | Status: DC
Start: 2022-01-29 — End: 2022-01-29

## 2022-01-29 MED ORDER — TRAZODONE HCL 50 MG PO TABS
150.0000 mg | ORAL_TABLET | Freq: Every evening | ORAL | Status: DC | PRN
  Administered 2022-01-30: 150 mg via ORAL
  Filled 2022-01-29: qty 3

## 2022-01-29 MED ORDER — ALBUTEROL SULFATE (2.5 MG/3ML) 0.083% IN NEBU: 2.5000 mg | INHALATION_SOLUTION | Freq: Four times a day (QID) | RESPIRATORY_TRACT | Status: DC | PRN

## 2022-01-29 MED ORDER — MECLIZINE HCL 25 MG PO TABS
12.5000 mg | ORAL_TABLET | Freq: Once | ORAL | Status: AC
  Administered 2022-01-29: 12.5 mg via ORAL
  Filled 2022-01-29: qty 0.5

## 2022-01-29 NOTE — Assessment & Plan Note (Addendum)
-   Check B12, procalcitonin, high sensitive troponin - Fall precaution - PT consult

## 2022-01-29 NOTE — Assessment & Plan Note (Addendum)
-   Patient is on buprenorphine 10 mcg/h patch weekly, patient states that his current patch was placed on 01/22/2022 - Patient is due for the patch, this has been ordered with first dose on day of admission - Discussed with nursing and pharmacy service and placed the order as such

## 2022-01-29 NOTE — Assessment & Plan Note (Signed)
PPI ?

## 2022-01-29 NOTE — H&P (Signed)
History and Physical   Pax Reasoner VFI:[id number] DOB: 12/22/1934 DOA: 01/29/2022  PCP: Pcp, No  Patient coming from: Home via EMS  I have personally briefly reviewed patient's old medical records in [place].  Chief Concern: Mental status, shortness of breath  HPI: [name] is an 87 year old male with hypertension, depression, anxiety, insomnia, bilateral sensorineural hearing loss, legal blindness, COPD, who presents emergency department for chief concerns of shortness of breath and dizziness.  Initial vitals in the emergency department showed temperature of 98.9, respiration rate of 19, heart rate of 56, blood pressure 160/89, SPO2 of 96% on room air.  Serum sodium is 140, potassium 4.4, chloride 110, bicarb 24, BUN of 23, serum creatinine 1.16, GFR greater than 60, nonfasting blood glucose 133, WBC 4.9, hemoglobin 10.5, platelets of 106.  Initial troponin was 11.  UA collected in the ED was negative for nitrates and leukocytes.  Portable chest x-ray was read as ill-defined opacity at the left base. CT of the head without contrast: Was read as no acute intracranial abnormality.  ED treatment ceftriaxone 1 g IV, doxycycline 100 mg p.o. one-time dose.  At bedside patient was able to tell me his name, his age, he knows he is in the hospital.  He reports waking up feeling dizzy and generalized weakness.  He endorses cough that started evening of 01/28/2022.  He reports the cough is not productive.  He endorses worsening shortness of breath that started on 01/28/2022.  He denies known sick contacts.  He denies known fever and endorses chills.  He he denies nausea, vomiting, chest pain, abdominal pain, dysuria, hematuria, diarrhea, constipation.  Social history: Patient lives at home and has a live-in caregiver.  He denies tobacco, EtOH, recreational drug use.  ROS: Constitutional: no weight change, no fever ENT/Mouth: no sore throat, no rhinorrhea Eyes: no eye  pain, no vision changes Cardiovascular: no chest pain, + dyspnea,  no edema, no palpitations Respiratory: + cough, no sputum, no wheezing Gastrointestinal: no nausea, no vomiting, no diarrhea, no constipation Genitourinary: no urinary incontinence, no dysuria, no hematuria Musculoskeletal: no arthralgias, no myalgias Skin: no skin lesions, no pruritus, Neuro: + weakness, no loss of consciousness, no syncope Psych: no anxiety, no depression, + decrease appetite Heme/Lymph: no bruising, no bleeding  ED Course: Discussed with emergency medicine provider, patient requiring hospitalization due to concerns of altered mental status concerning for pneumonia.  Assessment/Plan  Principal Problem:   Altered mental status Active Problems:   Glaucoma of both eyes   Essential hypertension   GERD (gastroesophageal reflux disease)   Blindness   Tricompartment osteoarthritis of knee (Left)   COPD (chronic obstructive pulmonary disease) (HCC)   HLD (hyperlipidemia)   Depression with anxiety   Generalized anxiety disorder with panic attacks   Weakness   Assessment and Plan:  * Altered mental status - Etiology work-up in progress - Possible left lower lobe pneumonia - Procalcitonin, B12, lactic acid x2 - Check COVID PCR  Weakness - Check B12, procalcitonin, high sensitive troponin - Fall precaution - PT consult  Generalized anxiety disorder with panic attacks - Amitriptyline 10 mg nightly resumed, duloxetine 90 mg daily resumed  Depression with anxiety - Amitriptyline 10 mg nightly resumed - Lorazepam 0.5 mg nightly as needed for anxiety - Trazodone 100 mg nightly resumed  HLD (hyperlipidemia) - Rosuvastatin 10 mg nightly  COPD (chronic obstructive pulmonary disease) (HCC) - Albuterol, 2 puffs inhalation every 6 hours as needed for wheezing and shortness of breath  Tricompartment  osteoarthritis of knee (Left) - Patient is on buprenorphine 10 mcg/h patch weekly, patient states  that his current patch was placed on 01/22/2022 - Patient is due for the patch, this has been ordered with first dose on day of admission - Discussed with nursing and pharmacy service and placed the order as such  Blindness - Resumed home prednisolone acetic ophthalmic suspension, 1 drop in the left eye twice daily - Resumed Cosopt and prednisolone acetate drops  GERD (gastroesophageal reflux disease) - PPI  Essential hypertension - Amlodipine 10 mg daily resumed  Glaucoma of both eyes - Cosopt, 1 drop bid resumed  Fall and aspiration precaution  Chart reviewed.   DVT prophylaxis: Enoxaparin Code Status: Full code Diet: Heart healthy Family Communication: A phone call was offered, patient declined stating his daughter already knows he is in the hospital Disposition Plan: Pending clinical course Consults called: No Admission status: MedSurg, observation  Past Medical History:  Diagnosis Date   Anxiety    Cancer ([place])    skin   COPD (chronic obstructive pulmonary disease) ([place])    Glaucoma    Osteoporosis    osteopenia   Past Surgical History:  Procedure Laterality Date   CHOLECYSTECTOMY     TRANSURETHRAL RESECTION OF PROSTATE N/A 2008   Social History:  reports that he has quit smoking. His smoking use included cigarettes. His smokeless tobacco use includes chew. He reports that he does not currently use alcohol. He reports that he does not use drugs.  Allergies  Allergen Reactions   Azithromycin Shortness Of Breath   Family History  Problem Relation Age of Onset   Depression Mother    Heart disease Father    Family history: Family history reviewed and not pertinent.  Prior to Admission medications   Medication Sig Start Date End Date Taking? Authorizing Provider  acetaminophen (TYLENOL) 500 MG tablet Take 500 mg by mouth every 6 (six) hours as needed.    [provider]  albuterol (VENTOLIN HFA) 108 (90 Base) MCG/ACT inhaler INHALE 1-2 PUFFS INTO THE  LUNGS EVERY 6 HOURS AS NEEDED 09/22/19   Karamalegos, Devonne Doughty, DO  amLODipine (NORVASC) 10 MG tablet Take 1 tablet (10 mg total) by mouth daily. 02/10/21   Karamalegos, Devonne Doughty, DO  Azelastine HCl 137 MCG/SPRAY SOLN Place 1 spray into the nose daily. 09/17/20   Karamalegos, Devonne Doughty, DO  buprenorphine (BUTRANS) 10 MCG/HR PTWK Place 1 patch onto the skin once a week for 28 days. Apply only 1 patch at a time and alternate sites weekly. 04/20/21 05/18/21  Milinda Pointer, MD  COSOPT 22.3-6.8 MG/ML ophthalmic solution 1 drop 2 (two) times daily. 06/25/20   [provider]  diclofenac Sodium (VOLTAREN) 1 % GEL Apply 2 g topically at bedtime. Apply to both knees twice daily 02/10/21 08/04/21  Olin Hauser, DO  DULoxetine (CYMBALTA) 20 MG capsule TAKE 4 CAPSULES BY MOUTH ONCE DAILY 04/11/21   Parks Ranger, Devonne Doughty, DO  fenofibrate (TRICOR) 145 MG tablet Take 1 tablet (145 mg total) by mouth daily. 02/10/21   Karamalegos, Devonne Doughty, DO  ferrous sulfate 325 (65 FE) MG tablet Take 325 mg by mouth daily with breakfast.    [provider]  fluticasone (FLONASE) 50 MCG/ACT nasal spray USE 2 SPRAYS INTO EACH NOSTRIL ONCE DAILY USE 4-6 WKS THEN STOP AND USE SEASONALLY/AS NEEDED 02/10/21   Parks Ranger, Devonne Doughty, DO  hydrOXYzine (ATARAX/VISTARIL) 25 MG tablet TAKE 1 TABLET BY MOUTH 4 TIMES DAILY 06/14/20   Karamalegos,  Devonne Doughty, DO  ibuprofen (ADVIL) 200 MG tablet Take 200 mg by mouth every 6 (six) hours as needed.    [provider]  LORazepam (ATIVAN) 0.5 MG tablet TAKE 1 TABLET BY MOUTH EVERY 6 HOURS AS NEEDED ANXIETY 01/24/21   Karamalegos, Devonne Doughty, DO  montelukast (SINGULAIR) 10 MG tablet Take 1 tablet (10 mg total) by mouth at bedtime. 02/10/21   Olin Hauser, DO  Multiple Vitamin (MULTIVITAMIN WITH MINERALS) TABS tablet Take 1 tablet by mouth daily.    [provider]  mupirocin cream (BACTROBAN) 2 % Apply 1 application topically 2 (two)  times daily. For 1-2 weeks as needed, skin sore 02/25/20   Parks Ranger, Devonne Doughty, DO  pantoprazole (PROTONIX) 40 MG tablet Take 1 tablet (40 mg total) by mouth daily. 05/25/20   Karamalegos, Devonne Doughty, DO  prednisoLONE acetate (PRED FORTE) 1 % ophthalmic suspension Place 1 drop into the left eye as needed. 03/13/20   [provider]  QUEtiapine (SEROQUEL) 200 MG tablet Take 1 tablet (200 mg total) by mouth at bedtime. 02/10/21   Karamalegos, Devonne Doughty, DO  risperiDONE (RISPERDAL) 0.5 MG tablet Take 1 tablet (0.5 mg total) by mouth 2 (two) times daily. 02/10/21   Karamalegos, Devonne Doughty, DO  tamsulosin (FLOMAX) 0.4 MG CAPS capsule Take 1 capsule (0.4 mg total) by mouth daily. 02/10/21   Karamalegos, Devonne Doughty, DO  traMADol (ULTRAM) 50 MG tablet Take 50 mg by mouth 3 (three) times daily as needed.    [provider]  traZODone (DESYREL) 100 MG tablet Take 1 tablet (100 mg total) by mouth at bedtime. 02/10/21   Olin Hauser, DO   Physical Exam: Vitals:   01/29/22 1050 01/29/22 1600  BP: (!) 160/89 (!) 141/67  Pulse: (!) 56 60  Resp: 19 18  Temp: 98.9 F (37.2 C) 99 F (37.2 C)  TempSrc: Oral   SpO2: 96% 96%   Constitutional: appears frail, chronically ill, NAD, calm, comfortable Eyes: Legal blindness from bilateral eyes.  Right eye is closed. ENMT: Mucous membranes are moist. Posterior pharynx clear of any exudate or lesions. Age-appropriate dentition. Hearing appropriate Neck: normal, supple, no masses, no thyromegaly Respiratory: clear to auscultation bilaterally, no wheezing, no crackles. Normal respiratory effort. No accessory muscle use.  Cardiovascular: Regular rate and rhythm, no murmurs / rubs / gallops. No extremity edema. 2+ pedal pulses. No carotid bruits.  Abdomen: Obese abdomen, no tenderness, no masses palpated, no hepatosplenomegaly. Bowel sounds positive.  Musculoskeletal: no clubbing / cyanosis. No joint deformity upper and lower extremities.  Good ROM, no contractures, no atrophy. Normal muscle tone.  Skin: no rashes, lesions, ulcers. No induration. Butrans pain patch is present on back. Neurologic: Sensation intact. Strength 5/5 in all 4.  Psychiatric: Normal judgment and insight. Alert and oriented x 3. Normal mood.   EKG: independently reviewed, showing junctional rhythm with rate of 63, QTc 451  Chest x-ray on Admission: I personally reviewed and I agree with radiologist reading as below.  CT Head Wo Contrast  Result Date: 01/29/2022 CLINICAL DATA:  87 year old male with altered mental status. EXAM: CT HEAD WITHOUT CONTRAST TECHNIQUE: Contiguous axial images were obtained from the base of the skull through the vertex without intravenous contrast. RADIATION DOSE REDUCTION: This exam was performed according to the departmental dose-optimization program which includes automated exposure control, adjustment of the mA and/or kV according to patient size and/or use of iterative reconstruction technique. COMPARISON:  Head CT 01/22/2022.  Brain MRI 09/13/2019. FINDINGS: Brain:  Cerebral volume appears stable and within normal limits for age. No midline shift, ventriculomegaly, mass effect, evidence of mass lesion, intracranial hemorrhage or evidence of cortically based acute infarction. Patchy bilateral white matter hypodensity does not appear significantly changed from a 2021 MRI. No cortical encephalomalacia identified. Vascular: Calcified atherosclerosis at the skull base. No suspicious intracranial vascular hyperdensity. Skull: No acute osseous abnormality identified. Sinuses/Orbits: Visualized paranasal sinuses and mastoids are stable and well aerated. Other: No acute orbit or scalp soft tissue finding. IMPRESSION: Stable mild for age chronic white matter changes. No acute intracranial abnormality. Electronically Signed   By: Genevie Ann M.D.   On: 01/29/2022 11:27   DG Chest 2 View  Result Date: 01/29/2022 CLINICAL DATA:  Shortness of breath.  Dizziness. EXAM: CHEST - 2 VIEW COMPARISON:  One-view chest x-ray 01/22/2022 FINDINGS: The heart size is normal. Ill-defined airspace opacity is present at the left lower lobe. Lungs are otherwise clear. Ankylosis of the thoracic spine again noted. IMPRESSION: Ill-defined airspace opacity at the left base. While this may represent atelectasis, early infection is not excluded. Electronically Signed   By: San Morelle M.D.   On: 01/29/2022 11:07    Labs on Admission: I have personally reviewed following labs  CBC: Recent Labs  Lab 01/29/22 1125  WBC 4.9  NEUTROABS 2.8  HGB 10.5*  HCT 35.6*  MCV 85.2  PLT 144*   Basic Metabolic Panel: Recent Labs  Lab 01/29/22 1125  NA 140  K 4.4  CL 110  CO2 24  GLUCOSE 133*  BUN 23  CREATININE 1.16  CALCIUM 9.0   GFR: Estimated Creatinine Clearance: 53.7 mL/min (by C-G formula based on SCr of 1.16 mg/dL).  Liver Function Tests: Recent Labs  Lab 01/29/22 1125  AST 25  ALT 12  ALKPHOS 66  BILITOT 0.6  PROT 6.6  ALBUMIN 3.5   Urine analysis:    Component Value Date/Time   COLORURINE YELLOW (A) 01/29/2022 1040   APPEARANCEUR CLEAR (A) 01/29/2022 1040   LABSPEC 1.012 01/29/2022 1040   PHURINE 5.0 01/29/2022 1040   GLUCOSEU NEGATIVE 01/29/2022 1040   HGBUR NEGATIVE 01/29/2022 1040   BILIRUBINUR NEGATIVE 01/29/2022 1040   BILIRUBINUR Negative 07/12/2020 [place] 01/29/2022 1040   PROTEINUR NEGATIVE 01/29/2022 1040   UROBILINOGEN 0.2 07/12/2020 1348   NITRITE NEGATIVE 01/29/2022 1040   LEUKOCYTESUR NEGATIVE 01/29/2022 1040   Dr. Tobie Poet [facility]  If 7PM-7AM, please contact overnight-coverage provider If 7AM-7PM, please contact day coverage provider [email or web address]  01/29/2022, 6:31 PM

## 2022-01-29 NOTE — Assessment & Plan Note (Signed)
-   Resumed home prednisolone acetic ophthalmic suspension, 1 drop in the left eye twice daily - Resumed Cosopt and prednisolone acetate drops

## 2022-01-29 NOTE — Hospital Course (Addendum)
Mr. Cordel Drewes is an 87 year old male with hypertension, depression, anxiety, insomnia, bilateral sensorineural hearing loss, legal blindness, COPD, who presents emergency department for chief concerns of shortness of breath and dizziness.  Initial vitals in the emergency department showed temperature of 98.9, respiration rate of 19, heart rate of 56, blood pressure 160/89, SPO2 of 96% on room air.  Serum sodium is 140, potassium 4.4, chloride 110, bicarb 24, BUN of 23, serum creatinine 1.16, GFR greater than 60, nonfasting blood glucose 133, WBC 4.9, hemoglobin 10.5, platelets of 106.  Initial troponin was 11.  UA collected in the ED was negative for nitrates and leukocytes.  Portable chest x-ray was read as ill-defined opacity at the left base. CT of the head without contrast: Was read as no acute intracranial abnormality.  ED treatment ceftriaxone 1 g IV, doxycycline 100 mg p.o. one-time dose.

## 2022-01-29 NOTE — Assessment & Plan Note (Signed)
-   Cosopt, 1 drop bid resumed

## 2022-01-29 NOTE — ED Provider Notes (Signed)
[facility] Provider Note    Event Date/Time   First MD Initiated Contact with Patient 01/29/22 1031     (approximate)   History   Chief Complaint Dizziness (With shortness of breath and painful urination x2 days, sats for EMS 91%)   HPI  Aaron Gibbs is a 87 y.o. male with past medical history of hypertension, GERD, COPD, depression, and chronic pain syndrome who presents to the ED complaining of dizziness.  Patient reports that he has been feeling increasingly dizzy and lightheaded for the past 3 days.  He states he does not feel dizzy if he is sitting still, but begins to feel lightheaded and like he is going to pass out if he tries to stand up and use his walker.  He is blind at baseline, denies any speech changes, numbness, or weakness in his extremities.  He has not had any chest pain since development of dizziness, does report some mild difficulty breathing as well as a headache and some discomfort when he urinates.  He has not had any fevers, flank pain, abdominal pain, nausea, vomiting, or diarrhea.  He denies any cough, denies any pain outside of his chronic back pain.     Physical Exam   Triage Vital Signs: ED Triage Vitals  Enc Vitals Group     BP      Pulse      Resp      Temp      Temp src      SpO2      Weight      Height      Head Circumference      Peak Flow      Pain Score      Pain Loc      Pain Edu?      Excl. in [place]?     Most recent vital signs: Vitals:   01/29/22 1050  BP: (!) 160/89  Pulse: (!) 56  Resp: 19  Temp: 98.9 F (37.2 C)  SpO2: 96%    Constitutional: Alert and oriented. Eyes: Conjunctivae are normal. Head: Atraumatic. Nose: No congestion/rhinnorhea. Mouth/Throat: Mucous membranes are moist.  Cardiovascular: Normal rate, regular rhythm. Grossly normal heart sounds.  2+ radial pulses bilaterally. Respiratory: Normal respiratory effort.  No retractions. Lungs CTAB. Gastrointestinal: Soft and  nontender. No distention. Musculoskeletal: No lower extremity tenderness nor edema.  Neurologic:  Normal speech and language. No gross focal neurologic deficits are appreciated.    ED Results / Procedures / Treatments   Labs (all labs ordered are listed, but only abnormal results are displayed) Labs Reviewed  CBC WITH DIFFERENTIAL/PLATELET - Abnormal; Notable for the following components:      Result Value   RBC 4.18 (*)    Hemoglobin 10.5 (*)    HCT 35.6 (*)    MCH 25.1 (*)    MCHC 29.5 (*)    RDW 15.9 (*)    Platelets 106 (*)    All other components within normal limits  COMPREHENSIVE METABOLIC PANEL - Abnormal; Notable for the following components:   Glucose, Bld 133 (*)    All other components within normal limits  URINALYSIS, ROUTINE W REFLEX MICROSCOPIC - Abnormal; Notable for the following components:   Color, Urine YELLOW (*)    APPearance CLEAR (*)    All other components within normal limits  TROPONIN I (HIGH SENSITIVITY)  TROPONIN I (HIGH SENSITIVITY)     EKG  ED ECG REPORT I, Blake Divine, the attending physician,  personally viewed and interpreted this ECG.   Date: 01/29/2022  EKG Time: 11:28  Rate: 63  Rhythm: normal sinus rhythm  Axis: Normal  Intervals:none  ST&T Change: None  RADIOLOGY Chest x-ray reviewed and interpreted by me with left lower lobe infiltrate concerning for pneumonia.  PROCEDURES:  Critical Care performed: No  Procedures   MEDICATIONS ORDERED IN ED: Medications  cefTRIAXone (ROCEPHIN) 1 g in sodium chloride 0.9 % 100 mL IVPB (has no administration in time range)  doxycycline (VIBRA-TABS) tablet 100 mg (has no administration in time range)     IMPRESSION / MDM / ASSESSMENT AND PLAN / ED COURSE  I reviewed the triage vital signs and the nursing notes.                              87 y.o. male with past medical history of hypertension, GERD, COPD, depression, and chronic pain syndrome who presents to the ED  complaining of dizziness and lightheadedness worsening over the past 3 days, making it difficult for him to walk.  Patient's presentation is most consistent with acute presentation with potential threat to life or bodily function.  Differential diagnosis includes, but is not limited to, arrhythmia, ACS, PE, pneumonia, pneumothorax, dehydration, electrolyte abnormality, AKI, anemia, UTI.  Patient nontoxic-appearing and in no acute distress, vital signs are unremarkable and he has no focal neurologic deficits on exam.  He does complain of some headache and we will check CT head.  He additionally complains of some shortness of breath and we will screen EKG, chest x-ray, and labs including troponin.  Plan to hydrate with IV fluids and reassess.  CT head is negative for acute process however chest x-ray is concerning for left lower lobe pneumonia.  Patient does endorse some difficulty breathing as well as a recent dry cough, with his generalized weakness, pneumonia appears consistent with his presentation.  We will start on Rocephin and doxycycline given his azithromycin allergy.  Remainder of labs are reassuring with no significant anemia, leukocytosis, electrolyte abnormality, or AKI.  Troponin is within normal limits and I doubt ACS.  Case discussed with hospitalist for admission.      FINAL CLINICAL IMPRESSION(S) / ED DIAGNOSES   Final diagnoses:  Community acquired pneumonia of left lower lobe of lung  Generalized weakness     Rx / DC Orders   ED Discharge Orders     None        Note:  This document was prepared using Dragon voice recognition software and may include unintentional dictation errors.   Blake Divine, MD 01/29/22 1311

## 2022-01-29 NOTE — Progress Notes (Signed)
       CROSS COVER NOTE  NAME: Aaron Gibbs MRN: [id number] DOB : 02/07/1935    Date of Service   01/29/22  HPI/Events of Note   Contacted by pharmacy to report "This patient's med rec was updated once daughter got back home to verified current medication. Multiple changes are needed. Most significant at this time, not taking furosemide, mirtazapine, gabapentin, and lorazepam (noted admitted with AMS and already on bumex). He is not taking prednisone eye drops, amlodipine, or finasteride either."  Interventions   Plan: D/c Lasix, proscar, remeron, gabapentin, prednisolone eye drops, and amlodipine.   Meclizine x1 for ongoing report of dizziness      This document was prepared using Dragon voice recognition software and may include unintentional dictation errors.  Neomia Glass DNP, MHA, FNP-BC Nurse Practitioner [facility] Pager [phone]

## 2022-01-29 NOTE — Assessment & Plan Note (Signed)
-   Albuterol, 2 puffs inhalation every 6 hours as needed for wheezing and shortness of breath

## 2022-01-29 NOTE — Assessment & Plan Note (Signed)
-   Rosuvastatin 10 mg nightly

## 2022-01-29 NOTE — Assessment & Plan Note (Signed)
-  Continue amlodipine 

## 2022-01-29 NOTE — Assessment & Plan Note (Signed)
-   Amitriptyline 10 mg nightly resumed, duloxetine 90 mg daily resumed

## 2022-01-29 NOTE — Assessment & Plan Note (Addendum)
-   Amitriptyline 10 mg nightly resumed - Lorazepam 0.5 mg nightly as needed for anxiety - Trazodone 100 mg nightly resumed

## 2022-01-29 NOTE — Assessment & Plan Note (Addendum)
-   Etiology work-up in progress - Possible left lower lobe pneumonia - Procalcitonin, B12, lactic acid x2 - Check COVID PCR

## 2022-01-29 NOTE — ED Notes (Signed)
On MRI

## 2022-01-30 DIAGNOSIS — H8110 Benign paroxysmal vertigo, unspecified ear: Secondary | ICD-10-CM

## 2022-01-30 LAB — BASIC METABOLIC PANEL
Anion gap: 6 (ref 5–15)
BUN: 24 mg/dL — ABNORMAL HIGH (ref 8–23)
CO2: 25 mmol/L (ref 22–32)
Calcium: 8.9 mg/dL (ref 8.9–10.3)
Chloride: 108 mmol/L (ref 98–111)
Creatinine, Ser: 1.2 mg/dL (ref 0.61–1.24)
GFR, Estimated: 60 mL/min — ABNORMAL LOW (ref >60)
Glucose, Bld: 98 mg/dL (ref 70–99)
Potassium: 3.6 mmol/L (ref 3.5–5.1)
Sodium: 139 mmol/L (ref 135–145)

## 2022-01-30 LAB — RESPIRATORY PANEL BY PCR

## 2022-01-30 LAB — CBC
HCT: 32.7 % — ABNORMAL LOW (ref 39.0–52.0)
Hemoglobin: 9.9 g/dL — ABNORMAL LOW (ref 13.0–17.0)
MCH: 25.3 pg — ABNORMAL LOW (ref 26.0–34.0)
MCHC: 30.3 g/dL (ref 30.0–36.0)
MCV: 83.6 fL (ref 80.0–100.0)
Platelets: 119 10*3/uL — ABNORMAL LOW (ref 150–400)
RBC: 3.91 MIL/uL — ABNORMAL LOW (ref 4.22–5.81)
RDW: 16 % — ABNORMAL HIGH (ref 11.5–15.5)
WBC: 3.5 10*3/uL — ABNORMAL LOW (ref 4.0–10.5)
nRBC: 0 % (ref 0.0–0.2)

## 2022-01-30 LAB — VITAMIN B12: Vitamin B-12: 507 pg/mL (ref 180–914)

## 2022-01-30 MED ORDER — MECLIZINE HCL 12.5 MG PO TABS: 12.5000 mg | ORAL_TABLET | Freq: Three times a day (TID) | ORAL | 0 refills | Status: DC

## 2022-01-30 MED ORDER — AMLODIPINE BESYLATE 10 MG PO TABS: 10.0000 mg | ORAL_TABLET | Freq: Every day | ORAL | 0 refills | Status: DC

## 2022-01-30 MED ORDER — HYDROXYZINE HCL 10 MG PO TABS
10.0000 mg | ORAL_TABLET | Freq: Once | ORAL | Status: AC
Start: 2022-01-30 — End: 2022-01-30
  Administered 2022-01-30: 10 mg via ORAL
  Filled 2022-01-30: qty 1

## 2022-01-30 MED ORDER — PREDNISOLONE ACETATE 1 % OP SUSP: 1.0000 [drp] | Freq: Two times a day (BID) | OPHTHALMIC | 0 refills | Status: DC

## 2022-01-30 MED ORDER — MECLIZINE HCL 25 MG PO TABS: 12.5000 mg | ORAL_TABLET | Freq: Two times a day (BID) | ORAL | Status: DC | PRN

## 2022-01-30 MED ORDER — AMLODIPINE BESYLATE 10 MG PO TABS
10.0000 mg | ORAL_TABLET | Freq: Every day | ORAL | Status: DC
  Administered 2022-01-30: 10 mg via ORAL
  Filled 2022-01-30: qty 1

## 2022-01-30 MED ORDER — PREDNISOLONE ACETATE 1 % OP SUSP
1.0000 [drp] | Freq: Two times a day (BID) | OPHTHALMIC | Status: DC
  Administered 2022-01-30: 1 [drp] via OPHTHALMIC
  Filled 2022-01-30: qty 1

## 2022-01-30 MED ORDER — MECLIZINE HCL 25 MG PO TABS
12.5000 mg | ORAL_TABLET | Freq: Three times a day (TID) | ORAL | Status: DC
  Administered 2022-01-30: 12.5 mg via ORAL
  Filled 2022-01-30 (×2): qty 0.5

## 2022-01-30 NOTE — Discharge Instructions (Signed)
Ambulatory Vestibular PT ordered

## 2022-01-30 NOTE — Discharge Summary (Signed)
Physician Discharge Summary   Patient: Aaron Gibbs MRN: [id number] DOB: 16-Feb-1935  Admit date:     01/29/2022  Discharge date: 01/30/22  Discharge Physician: Fritzi Mandes   PCP: Olin Hauser, DO   Recommendations at discharge:   follow-up PCP in 1 to 2 weeks follow-up with ENT Dr.Juengel if no improvement in symptoms  Discharge Diagnoses:  Vertigo suspected BPPV  Hospital Course: Aaron Gibbs is an 87 year old male with hypertension, depression, anxiety, insomnia, bilateral sensorineural hearing loss, legal blindness, COPD, who presents emergency department for chief concerns of shortness of breath and dizziness.  Vertigo suspect benign positional -- patient came in with 3 to 4 days of acute onset dizziness morbid change in position. Denies any accompanying nausea vomiting or any recent fall. Lives with his caregiver and ambulate using a walker. -- CT head negative for stroke. Age-related atrophy. 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 Labs stable. Discussed discharge plan with patient's daughter Aaron Gibbs on the phone       Consultants: none Procedures performed: none  Disposition: Home Diet recommendation:  Discharge Diet Orders (From admission, onward)     Start     Ordered   01/30/22 0000  Diet - low sodium  heart healthy        01/30/22 1238           Cardiac diet DISCHARGE MEDICATION: Allergies as of 01/30/2022       Reactions   Azithromycin Shortness Of Breath        Medication List     STOP taking these medications    ibuprofen 200 MG tablet Commonly known as: ADVIL   sucralfate 1 g tablet Commonly known as: CARAFATE       TAKE these medications    acetaminophen 500 MG tablet Commonly known as: TYLENOL Take 500 mg by mouth every 6 (six) hours as needed.   albuterol 108 (90 Base) MCG/ACT inhaler Commonly known as: VENTOLIN HFA INHALE 1-2 PUFFS INTO THE LUNGS EVERY 6 HOURS AS NEEDED   amitriptyline 10 MG tablet Commonly known as: ELAVIL Take 10 mg by mouth at bedtime.   amLODipine 10 MG tablet Commonly known as: NORVASC Take 1 tablet (10 mg total) by mouth daily.   bumetanide 1 MG tablet Commonly known as: BUMEX Take 0.5 mg by mouth 2 (two) times daily.   buprenorphine 10 MCG/HR Ptwk Commonly known as: BUTRANS Place 1 patch onto the skin once a week for 28 days. Apply only 1 patch at a time and alternate sites weekly.   carvedilol 12.5 MG tablet Commonly known as: COREG Take 12.5 mg by mouth 2 (two) times daily.   cetirizine 10 MG tablet Commonly known as: ZYRTEC Take 10 mg by mouth daily.   Cosopt 22.3-6.8 MG/ML ophthalmic solution Generic drug: dorzolamide-timolol Place 1 drop into both eyes 2 (two) times daily.   diclofenac Sodium 1 % Gel Commonly known as: VOLTAREN Apply  2 g topically at bedtime. Apply to both knees twice daily   DULoxetine 30 MG capsule Commonly known as: CYMBALTA Take 30 mg by mouth daily.   losartan 50 MG tablet Commonly known as: COZAAR Take 50 mg by mouth daily.   meclizine 12.5 MG tablet Commonly known as: ANTIVERT Take 1 tablet (12.5 mg total) by mouth 3 (three) times daily.   melatonin 3 MG Tabs tablet Take 3 mg by mouth at bedtime.   OLANZapine 2.5 MG tablet Commonly known as: ZYPREXA Take 2.5 mg by  mouth 2 (two) times daily.   pantoprazole 40 MG tablet Commonly known as: PROTONIX Take 1 tablet (40 mg total) by mouth daily. What changed: when to take this   prednisoLONE acetate 1 % ophthalmic suspension Commonly known as: PRED FORTE Place 1 drop into the left eye 2 (two) times daily.   tamsulosin 0.4 MG Caps capsule Commonly known as: FLOMAX Take 1 capsule (0.4 mg total) by mouth daily.   traZODone 150 MG tablet Commonly known as: DESYREL Take 150 mg by mouth at bedtime as needed.        Follow-up Information     Margaretha Sheffield, MD. Call.   Specialty: Otolaryngology Why: As needed, If symptoms worsen for vertigo Contact information: [address] [place] [address] [phone]         Olin Hauser, DO. Schedule an appointment as soon as possible for a visit in 1 week(s).   Specialty: Family Medicine Why: hospital f/u Contact information: [address] [address] [phone]                  Condition at discharge: fair  The results of significant diagnostics from this hospitalization (including imaging, microbiology, ancillary and laboratory) are listed below for reference.   Imaging Studies: CT Head Wo Contrast  Result Date: 01/29/2022 CLINICAL DATA:  87 year old male with altered mental status. EXAM: CT HEAD WITHOUT CONTRAST TECHNIQUE: Contiguous axial images were obtained from the base of the skull through the vertex without intravenous contrast. RADIATION DOSE REDUCTION: This exam was performed according to the departmental dose-optimization program which includes automated exposure control, adjustment of the mA and/or kV according to patient size and/or use of iterative reconstruction technique. COMPARISON:  Head CT 01/22/2022.  Brain MRI 09/13/2019. FINDINGS: Brain: Cerebral volume appears stable and within normal limits for age. No midline shift, ventriculomegaly, mass effect, evidence of mass lesion,  intracranial hemorrhage or evidence of cortically based acute infarction. Patchy bilateral white matter hypodensity does not appear significantly changed from a 2021 MRI. No cortical encephalomalacia identified. Vascular: Calcified atherosclerosis at the skull base. No suspicious intracranial vascular hyperdensity. Skull: No acute osseous abnormality identified. Sinuses/Orbits: Visualized paranasal sinuses and mastoids are stable and well aerated. Other: No acute orbit or scalp soft tissue finding. IMPRESSION: Stable mild for age chronic white matter changes. No acute intracranial abnormality. Electronically Signed   By: Genevie Ann M.D.   On: 01/29/2022 11:27   DG Chest 2 View  Result Date: 01/29/2022 CLINICAL DATA:  Shortness of breath. Dizziness. EXAM: CHEST - 2 VIEW COMPARISON:  One-view chest x-ray 01/22/2022 FINDINGS: The heart size is normal. Ill-defined airspace opacity is present at the left lower lobe. Lungs are otherwise clear. Ankylosis of the thoracic spine again noted. IMPRESSION: Ill-defined airspace opacity at the left base. While this may represent atelectasis, early infection is not excluded. Electronically Signed   By: San Morelle M.D.   On: 01/29/2022 11:07  CT HEAD WO CONTRAST (5MM)  Result Date: 01/22/2022 CLINICAL DATA:  Weakness. EXAM: CT HEAD WITHOUT CONTRAST TECHNIQUE: Contiguous axial images were obtained from the base of the skull through the vertex without intravenous contrast. RADIATION DOSE REDUCTION: This exam was performed according to the departmental dose-optimization program which includes automated exposure control, adjustment of the mA and/or kV according to patient size and/or use of iterative reconstruction technique. COMPARISON:  A 12/13/2019 FINDINGS: Brain: Vascular: There is no evidence for acute hemorrhage, hydrocephalus, mass lesion, or abnormal extra-axial fluid collection. No definite CT evidence for acute infarction. Diffuse loss of parenchymal volume is  consistent with atrophy. Patchy low attenuation in the deep hemispheric and periventricular white matter is nonspecific, but likely reflects chronic microvascular ischemic demyelination. Skull: No evidence for fracture. No worrisome lytic or sclerotic lesion. Sinuses/Orbits: The visualized paranasal sinuses and mastoid air cells are clear. Visualized portions of the globes and intraorbital fat are unremarkable. Other: None. IMPRESSION: 1. No acute intracranial abnormality. 2. Atrophy with chronic small vessel ischemic disease. Electronically Signed   By: [name] M.D.   On: 01/22/2022 12:11   DG Chest Portable 1 View  Result Date: 01/22/2022 CLINICAL DATA:  Shortness of breath.  Weakness. EXAM: PORTABLE CHEST 1 VIEW COMPARISON:  Radiographs 12/26/2019 and 12/13/2019.  CT 12/26/2019. FINDINGS: 1105 hours. The heart size and mediastinal contours are stable. The previously demonstrated lingular mass or infiltrate is not well seen on this portable examination and may be resolved given the 2 year interval. There is no new airspace disease, pleural effusion or pneumothorax. No acute osseous findings are evident. Telemetry leads overlie the chest. IMPRESSION: No evidence of active cardiopulmonary process. Electronically Signed   By: Richardean Sale M.D.   On: 01/22/2022 11:24    Microbiology: Results for orders placed or performed during the hospital encounter of 01/29/22  SARS Coronavirus 2 by RT PCR (hospital order, performed in [facility] hospital lab) *cepheid single result test* Anterior Nasal Swab     Status: None   Collection Time: 01/29/22  1:22 PM   Specimen: Anterior Nasal Swab  Result Value Ref Range Status   SARS Coronavirus 2 by RT PCR NEGATIVE NEGATIVE Final    Comment: (NOTE) SARS-CoV-2 target nucleic acids are NOT DETECTED.  The SARS-CoV-2 RNA is generally detectable in upper and lower respiratory specimens during the acute phase of infection. The lowest concentration of SARS-CoV-2  viral copies this assay can detect is 250 copies / mL. A negative result does not preclude SARS-CoV-2 infection and should not be used as the sole basis for treatment or other patient management decisions.  A negative result may occur with improper specimen collection / handling, submission of specimen other than nasopharyngeal swab, presence of viral mutation(s) within the areas targeted by this assay, and inadequate number of viral copies (<250 copies / mL). A negative result must be combined with clinical observations, patient history, and epidemiological information.  Fact Sheet for Patients:   https://www..info/  Fact Sheet for Healthcare Providers: https://hall.com/  This test is not yet approved or  cleared by the [place] FDA and has been authorized for detection and/or diagnosis of SARS-CoV-2 by FDA under an Emergency Use Authorization (EUA).  This EUA will remain in effect (meaning this test can be used) for the duration of the COVID-19 declaration under Section 564(b)(1) of the Act, 21 U.S.C. section 360bbb-3(b)(1), unless the authorization is terminated or revoked sooner.  Performed at [facility], [place], [place]  [address]   Respiratory (~20 pathogens) panel by PCR     Status: None   Collection Time: 01/29/22  7:39 PM   Specimen: Nasopharyngeal Swab; Respiratory  Result Value Ref Range Status   Adenovirus NOT DETECTED NOT DETECTED Final   Coronavirus 229E NOT DETECTED NOT DETECTED Final    Comment: (NOTE) The Coronavirus on the Respiratory Panel, DOES NOT test for the novel  Coronavirus (2019 nCoV)    Coronavirus HKU1 NOT DETECTED NOT DETECTED Final   Coronavirus NL63 NOT DETECTED NOT DETECTED Final   Coronavirus OC43 NOT DETECTED NOT DETECTED Final   Metapneumovirus NOT DETECTED NOT DETECTED Final   Rhinovirus / Enterovirus NOT DETECTED NOT DETECTED Final   Influenza A NOT DETECTED  NOT DETECTED Final   Influenza B NOT DETECTED NOT DETECTED Final   Parainfluenza Virus 1 NOT DETECTED NOT DETECTED Final   Parainfluenza Virus 2 NOT DETECTED NOT DETECTED Final   Parainfluenza Virus 3 NOT DETECTED NOT DETECTED Final   Parainfluenza Virus 4 NOT DETECTED NOT DETECTED Final   Respiratory Syncytial Virus NOT DETECTED NOT DETECTED Final   Bordetella pertussis NOT DETECTED NOT DETECTED Final   Bordetella Parapertussis NOT DETECTED NOT DETECTED Final   Chlamydophila pneumoniae NOT DETECTED NOT DETECTED Final   Mycoplasma pneumoniae NOT DETECTED NOT DETECTED Final    Comment: Performed at [facility], [place]. [address], [place], [place] [address]    Labs: CBC: Recent Labs  Lab 01/29/22 1125 01/30/22 0447  WBC 4.9 3.5*  NEUTROABS 2.8  --   HGB 10.5* 9.9*  HCT 35.6* 32.7*  MCV 85.2 83.6  PLT 106* 038*   Basic Metabolic Panel: Recent Labs  Lab 01/29/22 1125 01/30/22 0447  NA 140 139  K 4.4 3.6  CL 110 108  CO2 24 25  GLUCOSE 133* 98  BUN 23 24*  CREATININE 1.16 1.20  CALCIUM 9.0 8.9   Liver Function Tests: Recent Labs  Lab 01/29/22 1125  AST 25  ALT 12  ALKPHOS 66  BILITOT 0.6  PROT 6.6  ALBUMIN 3.5   CBG: No results for input(s): "GLUCAP" in the last 168 hours.  Discharge time spent: greater than 30 minutes.  Signed: Fritzi Mandes, MD [facility] 01/30/2022

## 2022-01-30 NOTE — Evaluation (Signed)
Physical Therapy Evaluation Patient Details Name: Aaron Gibbs MRN: [id number] DOB: 02-12-1935 Today's Date: 01/30/2022  History of Present Illness  Pt is an 87 y/o M admitted on 01/29/22 after presenting to the ED with c/o SOB & dizziness. Pt admitted for tx of AMS with etiology work-up in progress. PMH: HTN, depression, anxiety, insomnia, B sensorineural hearing loss, legally blind, COPD  Clinical Impression  Pt seen for PT evaluation with caregiver initially present but exiting shortly after beginning of session. Pt recently moved back to [place] from [place] where he lives with 24 hr caregivers in a 1 level home with 4 steps with wideset B rails to enter. Pt is ambulatory with RW with caregiver providing directional cuing 2/2 decreased vision (pt is legally blind, reporting he can only see shadows). On this date, pt is able to complete bed mobility & STS with supervision, tolerates standing with RW & supervision. BP checked to assess for orthosatic hypotension, please see below. Pt notes dizziness has been occurring for 3-7 days & happens "when I turn my head". Upon supine>sit PT observed delayed R beating nystagmus x 2 occurences. Performed Marye Round, as well as Epley maneuver to tx BPPV then progressed to semont method with pt tolerating each 1x. Treatment is limited 2/2 pt's significant thoracic kyphosis, forward head, and very limited cervical ROM but positioned bed in trendelenburg to be as effective as possible, providing head & neck support. Overall, pt tolerated tx well but preferred to lay on R side at end of session, despite PT educating/encouraging him on neutral head alignment. Recommend pt f/u for additional BPPV tx.  BP checked in LUE: Supine: 138/52 mmHg (MAP 78), HR 65 bpm Sitting: 163/96 mmHg (MAP 111), HR 72 bpm Standing at 0: 136/90 mmHg (MAP 99), HR 88 bpm     Recommendations for follow up therapy are one component of a multi-disciplinary discharge planning process, led by  the attending physician.  Recommendations may be updated based on patient status, additional functional criteria and insurance authorization.  Follow Up Recommendations Outpatient PT (vestibular PT)      Assistance Recommended at Discharge Frequent or constant Supervision/Assistance  Patient can return home with the following  A little help with walking and/or transfers;A little help with bathing/dressing/bathroom;Assistance with cooking/housework;Assist for transportation;Help with stairs or ramp for entrance    Equipment Recommendations None recommended by PT  Recommendations for Other Services       Functional Status Assessment Patient has had a recent decline in their functional status and demonstrates the ability to make significant improvements in function in a reasonable and predictable amount of time.     Precautions / Restrictions Precautions Precautions: Fall Precaution Comments: legally blind (can see shadows) Restrictions Weight Bearing Restrictions: No      Mobility  Bed Mobility Overal bed mobility: Needs Assistance Bed Mobility: Sidelying to Sit, Supine to Sit, Sit to Supine   Sidelying to sit: Supervision Supine to sit: Supervision Sit to supine: Supervision   General bed mobility comments: use of bed rails    Transfers Overall transfer level: Needs assistance Equipment used: Rolling walker (2 wheels) Transfers: Sit to/from Stand Sit to Stand: Supervision           General transfer comment: STS from EOB    Ambulation/Gait Ambulation/Gait assistance:  (deferred 2/2 dizziness)                Stairs            Wheelchair Mobility    Modified  Rankin (Stroke Patients Only)       Balance Overall balance assessment: Needs assistance Sitting-balance support: Feet supported Sitting balance-Leahy Scale: Good Sitting balance - Comments: supervision static sitting   Standing balance support: Bilateral upper extremity supported,  During functional activity Standing balance-Leahy Scale: Fair                               Pertinent Vitals/Pain Pain Assessment Pain Assessment: No/denies pain    Home Living Family/patient expects to be discharged to:: Private residence Living Arrangements: Alone Available Help at Discharge: Personal care attendant;Available 24 hours/day (pt has 24 hr caregiver) Type of Home: House Home Access: Stairs to enter Entrance Stairs-Rails: Right;Left (wideset) Entrance Stairs-Number of Steps: 4   Home Layout: One level Home Equipment: Conservation officer, nature (2 wheels)      Prior Function               Mobility Comments: Pt denies falls in the past 6 months. Pt ambulates with RW with caregiver providing directional cuing. ADLs Comments: Pt is able to bath himself, caregiver assists with dressing & cooking. Pt toilets without assistance.     Hand Dominance        Extremity/Trunk Assessment   Upper Extremity Assessment Upper Extremity Assessment: Overall WFL for tasks assessed    Lower Extremity Assessment Lower Extremity Assessment: Generalized weakness    Cervical / Trunk Assessment Cervical / Trunk Assessment: Kyphotic (severely kyphotic with forward head, very minimal cerival ROM (AROM or PROM), able to rotate head < ~30 degrees either direction)  Communication   Communication: HOH (pt denies sensorineural hearing loss noted in chart)  Cognition Arousal/Alertness: Awake/alert Behavior During Therapy: WFL for tasks assessed/performed Overall Cognitive Status: Within Functional Limits for tasks assessed                                          General Comments      Exercises     Assessment/Plan    PT Assessment Patient needs continued PT services  PT Problem List Decreased activity tolerance;Decreased balance;Decreased mobility       PT Treatment Interventions Gait training;Balance training;Neuromuscular re-education;Stair  training;Functional mobility training;Therapeutic activities;Patient/family education;Therapeutic exercise    PT Goals (Current goals can be found in the Care Plan section)  Acute Rehab PT Goals Patient Stated Goal: for dizziness to improve PT Goal Formulation: With patient Time For Goal Achievement: 02/13/22 Potential to Achieve Goals: Good    Frequency Min 2X/week     Co-evaluation               AM-PAC PT "6 Clicks" Mobility  Outcome Measure Help needed turning from your back to your side while in a flat bed without using bedrails?: None Help needed moving from lying on your back to sitting on the side of a flat bed without using bedrails?: None Help needed moving to and from a bed to a chair (including a wheelchair)?: A Little Help needed standing up from a chair using your arms (e.g., wheelchair or bedside chair)?: A Little Help needed to walk in hospital room?: A Little Help needed climbing 3-5 steps with a railing? : A Little 6 Click Score: 20    End of Session   Activity Tolerance: Patient tolerated treatment well Patient left: in bed;with call bell/phone within reach;with bed alarm set Nurse  Communication: Mobility status PT Visit Diagnosis: Muscle weakness (generalized) (M62.81);BPPV    Time: 9390-3009 PT Time Calculation (min) (ACUTE ONLY): 44 min   Charges:   PT Evaluation $PT Eval Moderate Complexity: 1 Mod PT Treatments $Therapeutic Activity: 8-22 mins        Lavone Nian, PT, DPT 01/30/22, 12:19 PM   Waunita Schooner 01/30/2022, 12:12 PM

## 2022-01-30 NOTE — Progress Notes (Signed)
[facility]) Hospital Liaison note:  Notified via Echo from Dr. Fritzi Mandes of request for [place] services. Will continue to follow for disposition.  Please call with any outpatient palliative questions or concerns.  Thank you for the opportunity to participate in this patient's care.  Thank you, Lorelee Market, LPN [facility] Liaison [phone]

## 2022-01-30 NOTE — Care Management (Addendum)
Patient was discharged prior to [facility] working with patient and family other than initial questions.  Family stated patient does not have PCP, but they can transport to outpatient therapy.  MD notified  Addendum: patient lives with daughter and sees  Lauris Poag - Family [facility]    [phone]  Daughter spoke with MD, agreed to see Dr. Parks Ranger until they can locate a new PCP.

## 2022-01-30 NOTE — Therapy (Cosign Needed Addendum)
Occupational Therapy * Physical Therapy * Speech Therapy          DATE __14 Aug 2023_________________ PATIENT NAME___[name]__________________ PATIENT MRN_____[id number]_______________  DIAGNOSIS/DIAGNOSIS CODE ____ Generalized anxiety disorder with panic attacks F41.1, F41.0   02/04/2020 Yes  More...  Weakness R53.1   6/60/6301 Not Applicable  More...  Benign paroxysmal positional vertigo H81.10      __________________ DATE OF DISCHARGE: __14 Aug 2023____________  PRIMARY CARE PHYSICIAN     Olin Hauser, [place]    [phone]    Dear Provider (Name: __________________   Fax: ___________________________):   I certify that I have examined this patient and that occupational/physical/speech therapy is necessary on an outpatient basis.    The patient has expressed interest in completing their recommended course of therapy at your location.  Once a formal order from the patient's primary care physician has been obtained, please contact him/her to schedule an appointment for evaluation at your earliest convenience.   [  x]  Physical Therapy Evaluate and Treat          [  ]  Occupational Therapy Evaluate and Treat                                    [  ]  Speech Therapy Evaluate and Treat  ( x) vestibular therapy     The patient's primary care physician (listed above) must furnish and be responsible for a formal order such that the recommended services may be furnished while under the primary physician's care, and that the plan of care will be established and reviewed every 30 days (or more often if condition necessitates).

## 2022-01-30 NOTE — Progress Notes (Signed)
Pt a bit anxious about his discharge. He is requesting anxiety med. Will given vistaril x1. Already on po home meds for GAD/Depression. Explained again to him his vertigo is likley BPV and he is rxed meclizine also. He will f/u ENT as needed. Pt is ok to d/c

## 2022-02-01 ENCOUNTER — Telehealth: Payer: Self-pay | Admitting: Primary Care

## 2022-02-01 ENCOUNTER — Telehealth: Payer: Self-pay

## 2022-02-01 NOTE — Telephone Encounter (Signed)
I called [facility].  I agreed with Palliative services, and gave my verbal. They will initiate services.  Of note, I was not aware he had moved back to [place]. He moved away 02/2021 to [facility] to receive other caregiver support.  [name], [facility] 02/01/2022, 11:12 AM

## 2022-02-01 NOTE — Telephone Encounter (Signed)
Attempted to contact patient's daughter [name] to offer to schedule Palliative Consult, no answer - left message with reason for call along with my name and call back number.

## 2022-02-01 NOTE — Telephone Encounter (Signed)
Copied from [place] [id number]. Topic: Quick Communication - Home Health Verbal Orders >> Feb 01, 2022 10:41 AM Leilani Able wrote: Caller/Agency: [facility] Callback Number: [phone]  Requesting : re :Pt discharged from hospital 8/14 at discharge hospital recommend [facility]. Please call [phone]  Langley Gauss option 2 / re Dr Raliegh Ip agree with this order/ nurse CB ok

## 2022-02-03 ENCOUNTER — Encounter: Payer: Self-pay | Admitting: Family Medicine

## 2022-02-03 ENCOUNTER — Ambulatory Visit: Payer: Medicare Other | Admitting: Family Medicine

## 2022-02-03 VITALS — BP 146/77 | HR 69 | Ht 70.0 in | Wt 200.0 lb

## 2022-02-03 DIAGNOSIS — M792 Neuralgia and neuritis, unspecified: Secondary | ICD-10-CM | POA: Diagnosis not present

## 2022-02-03 DIAGNOSIS — H8113 Benign paroxysmal vertigo, bilateral: Secondary | ICD-10-CM | POA: Diagnosis not present

## 2022-02-03 DIAGNOSIS — R350 Frequency of micturition: Secondary | ICD-10-CM

## 2022-02-03 DIAGNOSIS — N401 Enlarged prostate with lower urinary tract symptoms: Secondary | ICD-10-CM | POA: Diagnosis not present

## 2022-02-03 DIAGNOSIS — H6983 Other specified disorders of Eustachian tube, bilateral: Secondary | ICD-10-CM

## 2022-02-03 DIAGNOSIS — I10 Essential (primary) hypertension: Secondary | ICD-10-CM | POA: Diagnosis not present

## 2022-02-03 MED ORDER — MELATONIN 5 MG PO CAPS: 5.0000 mg | ORAL_CAPSULE | Freq: Every day | ORAL | 0 refills | Status: DC

## 2022-02-03 MED ORDER — FLUTICASONE PROPIONATE 50 MCG/ACT NA SUSP: 2.0000 | Freq: Every day | NASAL | 3 refills | Status: DC

## 2022-02-03 NOTE — Patient Instructions (Addendum)
Thank you for coming to the office today.  Discontinue Amlodipine '10mg'$  Keep taking Losartan potassium '50mg'$  once daily for BP  Reduce Bumex (Bumetanide) fluid pill to HALF tab twice a day  Please schedule with Pain Management for the Buprenrophine patches  [facility] Address: [address], [place], [place] [address] Phone: [phone]  Dr Milinda Pointer  I will place a new referral.  ----------------  Start nasal steroid Flonase 2 sprays in each nostril daily for 4-6 weeks, may repeat course seasonally or as needed  Vestibular rehab referral.  -------------------------  1. You have symptoms of Vertigo (Benign Paroxysmal Positional Vertigo) - This is commonly caused by inner ear fluid imbalance, sometimes can be worsened by allergies and sinus symptoms, otherwise it can occur randomly sometimes and we may never discover the exact cause. - To treat this, try the Epley Manuever (see diagrams/instructions below) at home up to 3 times a day for 1-2 weeks or until symptoms resolve - You may take Meclizine as needed up to 3 times a day for dizziness, this will not cure symptoms but may help. Caution may make you drowsy.  If you develop significant worsening episode with vertigo that does not improve and you get severe headache, loss of vision, arm or leg weakness, slurred speech, or other concerning symptoms please seek immediate medical attention at Emergency Department.   See the next page for images describing the Epley Manuever.     ----------------------------------------------------------------------------------------------------------------------        Please schedule a Follow-up Appointment to: Return if symptoms worsen or fail to improve.  If you have any other questions or concerns, please feel free to call the office or send a message through [place]. You may also schedule an earlier appointment if necessary.  Additionally, you may be receiving  a survey about your experience at our office within a few days to 1 week by e-mail or mail. We value your feedback.  [name], DO [place] [facility]

## 2022-02-03 NOTE — Progress Notes (Unsigned)
Subjective:    Patient ID: Aaron Gibbs, male    DOB: 1935/03/24, 87 y.o.   MRN: [id number]  Aaron Gibbs is a 87 y.o. male presenting on 02/03/2022 for Hospitalization Follow-up   HPI  HOSPITAL FOLLOW-UP VISIT  Hospital/Location: [facility] Date of Admission: 01/29/22 Date of Discharge: 01/30/22 Transitions of care telephone call: Not completed.  Reason for Admission: Shortness of Breath  / Vertigo  - Hospital H&P and Discharge Summary have been reviewed - Patient presents today 4 days after recent hospitalization. Brief summary of recent course, patient had symptoms of shortness of breath, vertigo, hospitalized for 1, treated with breathing treatment one course IV antibiotic, then ruled out PNA  referral to PT Vestibular Upcoming apt with ENT  effusion ears  Referral back to Dr Dossie Arbour   - Today reports overall has done well after discharge. Symptoms of pain persistent. Breathing improved   I have reviewed the discharge medication list, and have reconciled the current and discharge medications today.   Current Outpatient Medications:    acetaminophen (TYLENOL) 500 MG tablet, Take 1,000 mg by mouth in the morning, at noon, and at bedtime., Disp: , Rfl:    albuterol (VENTOLIN HFA) 108 (90 Base) MCG/ACT inhaler, INHALE 1-2 PUFFS INTO THE LUNGS EVERY 6 HOURS AS NEEDED, Disp: 8.5 g, Rfl: 3   amitriptyline (ELAVIL) 10 MG tablet, Take 10 mg by mouth at bedtime., Disp: , Rfl:    bumetanide (BUMEX) 1 MG tablet, Take 0.5 mg by mouth 2 (two) times daily., Disp: , Rfl:    carvedilol (COREG) 12.5 MG tablet, Take 12.5 mg by mouth 2 (two) times daily., Disp: , Rfl:    cetirizine (ZYRTEC) 10 MG tablet, Take 10 mg by mouth daily., Disp: , Rfl:    COSOPT 22.3-6.8 MG/ML ophthalmic solution, Place 1 drop into both eyes 2 (two) times daily., Disp: , Rfl:    DULoxetine (CYMBALTA) 30 MG capsule, Take 90 mg by mouth daily., Disp: , Rfl:    finasteride (PROSCAR) 5 MG tablet, Take 1 tablet (5 mg  total) by mouth in the morning., Disp: 90 tablet, Rfl: 3   fluticasone (FLONASE) 50 MCG/ACT nasal spray, Place 2 sprays into both nostrils daily. Use for 4-6 weeks then stop and use seasonally or as needed., Disp: 16 g, Rfl: 3   losartan (COZAAR) 50 MG tablet, Take 50 mg by mouth daily., Disp: , Rfl:    meclizine (ANTIVERT) 12.5 MG tablet, Take 1 tablet (12.5 mg total) by mouth 3 (three) times daily., Disp: 20 tablet, Rfl: 0   Melatonin 5 MG CAPS, Take 1 capsule (5 mg total) by mouth at bedtime., Disp: , Rfl: 0   OLANZapine (ZYPREXA) 2.5 MG tablet, Take 2.5 mg by mouth 2 (two) times daily., Disp: , Rfl:    pantoprazole (PROTONIX) 40 MG tablet, Take 1 tablet (40 mg total) by mouth daily. (Patient taking differently: Take 40 mg by mouth 2 (two) times daily before a meal.), Disp: 90 tablet, Rfl: 3   prednisoLONE acetate (PRED FORTE) 1 % ophthalmic suspension, Place 1 drop into the left eye 2 (two) times daily., Disp: 5 mL, Rfl: 0   tamsulosin (FLOMAX) 0.4 MG CAPS capsule, Take 1 capsule (0.4 mg total) by mouth daily., Disp: 90 capsule, Rfl: 0   traZODone (DESYREL) 150 MG tablet, Take 150 mg by mouth at bedtime as needed., Disp: , Rfl:    buprenorphine (BUTRANS) 10 MCG/HR PTWK, Place 1 patch onto the skin once a week for 28 days. Apply  only 1 patch at a time and alternate sites weekly., Disp: 4 patch, Rfl: 0   diclofenac Sodium (VOLTAREN) 1 % GEL, Apply 2 g topically at bedtime. Apply to both knees twice daily, Disp: 350 g, Rfl: 0  ------------------------------------------------------------------------- Social History   Tobacco Use   Smoking status: Former    Years: 20.00    Types: Cigarettes   Smokeless tobacco: Current    Types: Chew   Tobacco comments:    stopped 15 years ago  Vaping Use   Vaping Use: Never used  Substance Use Topics   Alcohol use: Not Currently    Comment: past   Drug use: Never    Review of Systems Per HPI unless specifically indicated above     Objective:     BP (!) 146/77   Pulse 69   Ht '5\' 10"'$  (1.778 m)   Wt 200 lb (90.7 kg)   SpO2 97%   BMI 28.70 kg/m   Wt Readings from Last 3 Encounters:  02/03/22 200 lb (90.7 kg)  01/22/22 200 lb (90.7 kg)  02/10/21 200 lb (90.7 kg)    Physical Exam Vitals and nursing note reviewed.  Constitutional:      General: He is not in acute distress.    Appearance: He is well-developed. He is not diaphoretic.     Comments: Chronic ill appearing 86 yr male  HENT:     Head: Normocephalic and atraumatic.     Right Ear: Ear canal and external ear normal. There is no impacted cerumen.     Left Ear: Ear canal and external ear normal. There is no impacted cerumen.     Ears:     Comments: Effusion TMs Eyes:     General:        Right eye: No discharge.        Left eye: No discharge.     Conjunctiva/sclera: Conjunctivae normal.  Neck:     Thyroid: No thyromegaly.  Cardiovascular:     Rate and Rhythm: Normal rate and regular rhythm.     Pulses: Normal pulses.     Heart sounds: Normal heart sounds. No murmur heard. Pulmonary:     Effort: No respiratory distress.     Breath sounds: No wheezing or rales.     Comments: Increased work of breathing at baseline Musculoskeletal:        General: Normal range of motion.     Cervical back: Normal range of motion and neck supple.  Lymphadenopathy:     Cervical: No cervical adenopathy.  Skin:    General: Skin is warm and dry.     Findings: No erythema or rash.  Neurological:     Mental Status: He is alert and oriented to person, place, and time. Mental status is at baseline.  Psychiatric:        Behavior: Behavior normal.     Comments: Well groomed, good eye contact, normal speech and thoughts      CLINICAL DATA:  Shortness of breath. Dizziness.   EXAM: CHEST - 2 VIEW   COMPARISON:  One-view chest x-ray 01/22/2022   FINDINGS: The heart size is normal. Ill-defined airspace opacity is present at the left lower lobe. Lungs are otherwise clear.  Ankylosis of the thoracic spine again noted.   IMPRESSION: Ill-defined airspace opacity at the left base. While this may represent atelectasis, early infection is not excluded.     Electronically Signed   By: San Morelle M.D.   On: 01/29/2022 11:07  Results for orders placed or performed during the hospital encounter of 01/29/22  SARS Coronavirus 2 by RT PCR (hospital order, performed in [facility] hospital lab) *cepheid single result test* Anterior Nasal Swab   Specimen: Anterior Nasal Swab  Result Value Ref Range   SARS Coronavirus 2 by RT PCR NEGATIVE NEGATIVE  Respiratory (~20 pathogens) panel by PCR   Specimen: Nasopharyngeal Swab; Respiratory  Result Value Ref Range   Adenovirus NOT DETECTED NOT DETECTED   Coronavirus 229E NOT DETECTED NOT DETECTED   Coronavirus HKU1 NOT DETECTED NOT DETECTED   Coronavirus NL63 NOT DETECTED NOT DETECTED   Coronavirus OC43 NOT DETECTED NOT DETECTED   Metapneumovirus NOT DETECTED NOT DETECTED   Rhinovirus / Enterovirus NOT DETECTED NOT DETECTED   Influenza A NOT DETECTED NOT DETECTED   Influenza B NOT DETECTED NOT DETECTED   Parainfluenza Virus 1 NOT DETECTED NOT DETECTED   Parainfluenza Virus 2 NOT DETECTED NOT DETECTED   Parainfluenza Virus 3 NOT DETECTED NOT DETECTED   Parainfluenza Virus 4 NOT DETECTED NOT DETECTED   Respiratory Syncytial Virus NOT DETECTED NOT DETECTED   Bordetella pertussis NOT DETECTED NOT DETECTED   Bordetella Parapertussis NOT DETECTED NOT DETECTED   Chlamydophila pneumoniae NOT DETECTED NOT DETECTED   Mycoplasma pneumoniae NOT DETECTED NOT DETECTED  CBC with Differential  Result Value Ref Range   WBC 4.9 4.0 - 10.5 K/uL   RBC 4.18 (L) 4.22 - 5.81 MIL/uL   Hemoglobin 10.5 (L) 13.0 - 17.0 g/dL   HCT 35.6 (L) 39.0 - 52.0 %   MCV 85.2 80.0 - 100.0 fL   MCH 25.1 (L) 26.0 - 34.0 pg   MCHC 29.5 (L) 30.0 - 36.0 g/dL   RDW 15.9 (H) 11.5 - 15.5 %   Platelets 106 (L) 150 - 400 K/uL   nRBC 0.0 0.0 - 0.2 %    Neutrophils Relative % 57 %   Neutro Abs 2.8 1.7 - 7.7 K/uL   Lymphocytes Relative 18 %   Lymphs Abs 0.9 0.7 - 4.0 K/uL   Monocytes Relative 21 %   Monocytes Absolute 1.0 0.1 - 1.0 K/uL   Eosinophils Relative 4 %   Eosinophils Absolute 0.2 0.0 - 0.5 K/uL   Basophils Relative 0 %   Basophils Absolute 0.0 0.0 - 0.1 K/uL   Immature Granulocytes 0 %   Abs Immature Granulocytes 0.02 0.00 - 0.07 K/uL  Comprehensive metabolic panel  Result Value Ref Range   Sodium 140 135 - 145 mmol/L   Potassium 4.4 3.5 - 5.1 mmol/L   Chloride 110 98 - 111 mmol/L   CO2 24 22 - 32 mmol/L   Glucose, Bld 133 (H) 70 - 99 mg/dL   BUN 23 8 - 23 mg/dL   Creatinine, Ser 1.16 0.61 - 1.24 mg/dL   Calcium 9.0 8.9 - 10.3 mg/dL   Total Protein 6.6 6.5 - 8.1 g/dL   Albumin 3.5 3.5 - 5.0 g/dL   AST 25 15 - 41 U/L   ALT 12 0 - 44 U/L   Alkaline Phosphatase 66 38 - 126 U/L   Total Bilirubin 0.6 0.3 - 1.2 mg/dL   GFR, Estimated >60 >60 mL/min   Anion gap 6 5 - 15  Urinalysis, Routine w reflex microscopic Urine, Clean Catch  Result Value Ref Range   Color, Urine YELLOW (A) YELLOW   APPearance CLEAR (A) CLEAR   Specific Gravity, Urine 1.012 1.005 - 1.030   pH 5.0 5.0 - 8.0   Glucose, UA NEGATIVE  NEGATIVE mg/dL   Hgb urine dipstick NEGATIVE NEGATIVE   Bilirubin Urine NEGATIVE NEGATIVE   Ketones, ur NEGATIVE NEGATIVE mg/dL   Protein, ur NEGATIVE NEGATIVE mg/dL   Nitrite NEGATIVE NEGATIVE   Leukocytes,Ua NEGATIVE NEGATIVE  Lactic acid, plasma  Result Value Ref Range   Lactic Acid, Venous 1.3 0.5 - 1.9 mmol/L  Procalcitonin - Baseline  Result Value Ref Range   Procalcitonin <0.10 ng/mL  CBC  Result Value Ref Range   WBC 3.5 (L) 4.0 - 10.5 K/uL   RBC 3.91 (L) 4.22 - 5.81 MIL/uL   Hemoglobin 9.9 (L) 13.0 - 17.0 g/dL   HCT 32.7 (L) 39.0 - 52.0 %   MCV 83.6 80.0 - 100.0 fL   MCH 25.3 (L) 26.0 - 34.0 pg   MCHC 30.3 30.0 - 36.0 g/dL   RDW 16.0 (H) 11.5 - 15.5 %   Platelets 119 (L) 150 - 400 K/uL   nRBC 0.0  0.0 - 0.2 %  Basic metabolic panel  Result Value Ref Range   Sodium 139 135 - 145 mmol/L   Potassium 3.6 3.5 - 5.1 mmol/L   Chloride 108 98 - 111 mmol/L   CO2 25 22 - 32 mmol/L   Glucose, Bld 98 70 - 99 mg/dL   BUN 24 (H) 8 - 23 mg/dL   Creatinine, Ser 1.20 0.61 - 1.24 mg/dL   Calcium 8.9 8.9 - 10.3 mg/dL   GFR, Estimated 60 (L) >60 mL/min   Anion gap 6 5 - 15  Vitamin B12  Result Value Ref Range   Vitamin B-12 507 180 - 914 pg/mL  Lactic acid, plasma  Result Value Ref Range   Lactic Acid, Venous 0.9 0.5 - 1.9 mmol/L  Troponin I (High Sensitivity)  Result Value Ref Range   Troponin I (High Sensitivity) 7 <18 ng/L  Troponin I (High Sensitivity)  Result Value Ref Range   Troponin I (High Sensitivity) 7 <18 ng/L      Assessment & Plan:   Problem List Items Addressed This Visit     Neurogenic pain (Chronic)   Relevant Orders   Ambulatory referral to Pain Clinic   Benign paroxysmal positional vertigo   Relevant Orders   Ambulatory referral to Physical Therapy   Essential hypertension - Primary   Other Visit Diagnoses     Benign prostatic hyperplasia with urinary frequency       Relevant Medications   finasteride (PROSCAR) 5 MG tablet   Eustachian tube dysfunction, bilateral       Relevant Medications   fluticasone (FLONASE) 50 MCG/ACT nasal spray       Discontinue Amlodipine '10mg'$  Keep taking Losartan potassium '50mg'$  once daily for BP  Reduce Bumex (Bumetanide) fluid pill to HALF tab twice a day  Please schedule with Pain Management for the Buprenrophine patches  [facility] Address: [address], [place], [place] [address] Phone: [phone]  Dr Milinda Pointer  I will place a new referral.  ----------------  Start nasal steroid Flonase 2 sprays in each nostril daily for 4-6 weeks, may repeat course seasonally or as needed  Vestibular rehab referral.  Meds ordered this encounter  Medications   Melatonin 5 MG CAPS    Sig: Take 1  capsule (5 mg total) by mouth at bedtime.    Refill:  0   fluticasone (FLONASE) 50 MCG/ACT nasal spray    Sig: Place 2 sprays into both nostrils daily. Use for 4-6 weeks then stop and use seasonally or as needed.  Dispense:  16 g    Refill:  3    Follow up plan: Return if symptoms worsen or fail to improve.   [name], [facility] 02/03/2022, 2:38 PM

## 2022-02-08 ENCOUNTER — Other Ambulatory Visit: Payer: Medicare Other | Admitting: Nurse Practitioner

## 2022-02-14 ENCOUNTER — Ambulatory Visit: Payer: Self-pay

## 2022-02-14 ENCOUNTER — Telehealth: Payer: Medicare Other | Admitting: Physician Assistant

## 2022-02-14 ENCOUNTER — Telehealth: Payer: Medicare Other | Admitting: Nurse Practitioner

## 2022-02-14 DIAGNOSIS — H8110 Benign paroxysmal vertigo, unspecified ear: Secondary | ICD-10-CM

## 2022-02-14 DIAGNOSIS — H6983 Other specified disorders of Eustachian tube, bilateral: Secondary | ICD-10-CM

## 2022-02-14 MED ORDER — AMOXICILLIN 875 MG PO TABS: 875.0000 mg | ORAL_TABLET | Freq: Two times a day (BID) | ORAL | 0 refills | Status: AC

## 2022-02-14 NOTE — Telephone Encounter (Signed)
FYI...  Pt has a virtual visit today (02/14/2022) at 1pm  Thanks,   -[name]

## 2022-02-14 NOTE — Telephone Encounter (Signed)
  Chief Complaint: Dizziness Symptoms: Dizziness Frequency: 1 week Pertinent Negatives: Patient denies Fever, ear pain Disposition: '[]'$ ED /'[]'$ Urgent Care (no appt availability in office) / '[]'$ Appointment(In office/virtual)/ '[x]'$  [facility]/ '[]'$ Home Care/ '[]'$ Refused Recommended Disposition /'[]'$ [place]/ '[]'$  Follow-up with PCP Additional Notes: Pt has had dizziness for 1 week. Pt wants an antibiotic. PT states he is unable to into the office att this time. PT wants a virtual appt. . Appt made. Pt advised that he may have to go to UC for evaluation and treatment. PT thinks this is an ear issue. No ear pain.  Summary: Dizziness   Patient experiencing DIZZINESS 1 week. No other symptoms.      Reason for Disposition  [1] MODERATE dizziness (e.g., vertigo; feels very unsteady, interferes with normal activities) AND [2] has NOT been evaluated by doctor (or NP/PA) for this  Answer Assessment - Initial Assessment Questions 1. DESCRIPTION: "Describe your dizziness."     dizziness 2. VERTIGO: "Do you feel like either you or the room is spinning or tilting?"      no 3. LIGHTHEADED: "Do you feel lightheaded?" (e.g., somewhat faint, woozy, weak upon standing)     no 4. SEVERITY: "How bad is it?"  "Can you walk?"   - MILD: Feels slightly dizzy and unsteady, but is walking normally.   - MODERATE: Feels unsteady when walking, but not falling; interferes with normal activities (e.g., school, work).   - SEVERE: Unable to walk without falling, or requires assistance to walk without falling.     mild 5. ONSET:  "When did the dizziness begin?"     1 week 6. AGGRAVATING FACTORS: "Does anything make it worse?" (e.g., standing, change in head position)     Movement makes it worse 7. CAUSE: "What do you think is causing the dizziness?"     Ear infection? 8. RECURRENT SYMPTOM: "Have you had dizziness before?" If Yes, ask: "When was the last time?" "What happened that time?"     no 9. OTHER  SYMPTOMS: "Do you have any other symptoms?" (e.g., headache, weakness, numbness, vomiting, earache)     None 10. PREGNANCY: "Is there any chance you are pregnant?" "When was your last menstrual period?"       na  Protocols used: Dizziness - Vertigo-A-AH

## 2022-02-14 NOTE — Progress Notes (Signed)
Virtual Visit Consent   Aaron Gibbs, you are scheduled for a virtual visit with a [facility] provider today. Just as with appointments in the office, your consent must be obtained to participate. Your consent will be active for this visit and any virtual visit you may have with one of our providers in the next 365 days. If you have a MyChart account, a copy of this consent can be sent to you electronically.  As this is a virtual visit, video technology does not allow for your provider to perform a traditional examination. This may limit your provider's ability to fully assess your condition. If your provider identifies any concerns that need to be evaluated in person or the need to arrange testing (such as labs, EKG, etc.), we will make arrangements to do so. Although advances in technology are sophisticated, we cannot ensure that it will always work on either your end or our end. If the connection with a video visit is poor, the visit may have to be switched to a telephone visit. With either a video or telephone visit, we are not always able to ensure that we have a secure connection.  By engaging in this virtual visit, you consent to the provision of healthcare and authorize for your insurance to be billed (if applicable) for the services provided during this visit. Depending on your insurance coverage, you may receive a charge related to this service.  I need to obtain your verbal consent now. Are you willing to proceed with your visit today? Aaron Gibbs has provided verbal consent on 02/14/2022 for a virtual visit (video or telephone). Aaron Schneiders, FNP  Date: 02/14/2022 1:07 PM  Virtual Visit via Video Note   I, Aaron Gibbs, connected with  Aaron Gibbs  ([id number], 02/25/1935) on 85/29/23 at  1:00 PM EDT by a video-enabled telemedicine application and verified that I am speaking with the correct person using two identifiers.  Location: Patient: Virtual Visit Location Patient:  Home Provider: Virtual Visit Location Provider: Home Office   I discussed the limitations of evaluation and management by telemedicine and the availability of in person appointments. The patient expressed understanding and agreed to proceed.    History of Present Illness: Aaron Gibbs is a 87 y.o. who identifies as a male who was assigned male at birth, and is being seen today for ongoing dizziness for 10+ days now  He denies congestion but his ears have been full and are popping.  He has been suffering from allergies.  He feels dizzy when he moves and improves when he is still.   Was see at PCPs office 02/03/22 and given Flonase and a referral to PT for Paroxysmal positional vertigo- patient has not heard from PT   Feels now that it is more of an ear infection because he feels pressure and fluid in his ears  Problems:  Patient Active Problem List   Diagnosis Date Noted   Benign paroxysmal positional vertigo    Weakness 01/29/2022   Chronic knee pain (Left) 09/28/2020   Abnormal MRI, knee (08/05/2018) (Left) 09/13/2020   Long-term current use of benzodiazepine 08/09/2020   Opioid dependence, binge pattern ([place]) 08/09/2020   Opioid dependence with or without physiological dependence ([place]) 08/08/2020   History of substance use disorder 04/15/2020   Osteoarthritis of knee (Left) 03/23/2020   Aortic atherosclerosis ([place]) 02/05/2020   Generalized anxiety disorder with panic attacks 02/04/2020   Depression with anxiety 12/13/2019   Depression 12/12/2019   HLD (hyperlipidemia)  Iron deficiency anemia    Fall at home, initial encounter    Acute postoperative pain 06/10/2019   Medial meniscal tear, sequela (Left) 06/10/2019   Patellar tendinosis (Right) 06/10/2019   Lateral meniscal tear, sequela (Left) 03/03/2019   Palpitation 02/13/2019   Preop testing 11/14/2018   Noncompliance with medication treatment due to overuse of medication 10/23/2018   Neurogenic pain 08/20/2018    Atherosclerotic peripheral vascular disease ([place]) 07/24/2018   Tricompartment osteoarthritis of knee (Left) 07/24/2018   Osteoarthritis of knee (Bilateral) 07/24/2018   Osteoarthritis of patellofemoral joint (Right) 07/24/2018   History of suicide attempt (06/12/18) 07/24/2018   Long term current use of opiate analgesic 07/10/2018   Pharmacologic therapy 07/10/2018   Disorder of skeletal system 07/10/2018   Problems influencing health status 07/10/2018   Suicide attempt ([place]) (05/13/18) 34/19/6222   Acute metabolic encephalopathy 97/98/9211   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   Major depressive disorder, recurrent episode, severe ([place]) 06/04/2018   Blindness 05/10/2018   Suicidal ideation 05/10/2018   Chronic pain syndrome 05/01/2018   DISH (diffuse idiopathic skeletal hyperostosis) 05/01/2018   Osteoarthritis of multiple joints 05/01/2018   Chronic knee pain (1ry area of Pain) (Bilateral) (L>R) 05/01/2018   Retinitis pigmentosa of both eyes 05/01/2018   Glaucoma of both eyes 05/01/2018   Essential hypertension 05/01/2018   Chronic low back pain (Bilateral) w/o sciatica 05/01/2018   GERD (gastroesophageal reflux disease) 05/01/2018   AVM (arteriovenous malformation) of colon 05/01/2018   Therapeutic opioid-induced constipation (OIC) 05/01/2018    Allergies:  Allergies  Allergen Reactions   Azithromycin Shortness Of Breath   Medications:  Current Outpatient Medications:    acetaminophen (TYLENOL) 500 MG tablet, Take 1,000 mg by mouth in the morning, at noon, and at bedtime., Disp: , Rfl:    albuterol (VENTOLIN HFA) 108 (90 Base) MCG/ACT inhaler, INHALE 1-2 PUFFS INTO THE LUNGS EVERY 6 HOURS AS NEEDED, Disp: 8.5 g, Rfl: 3   amitriptyline (ELAVIL) 10 MG tablet, Take 10 mg by mouth at bedtime., Disp: , Rfl:    bumetanide (BUMEX) 1 MG tablet, Take 0.5 mg by mouth 2 (two) times daily., Disp: , Rfl:    buprenorphine (BUTRANS) 10 MCG/HR PTWK, Place 1 patch onto the skin once a week  for 28 days. Apply only 1 patch at a time and alternate sites weekly., Disp: 4 patch, Rfl: 0   carvedilol (COREG) 12.5 MG tablet, Take 12.5 mg by mouth 2 (two) times daily., Disp: , Rfl:    cetirizine (ZYRTEC) 10 MG tablet, Take 10 mg by mouth daily., Disp: , Rfl:    COSOPT 22.3-6.8 MG/ML ophthalmic solution, Place 1 drop into both eyes 2 (two) times daily., Disp: , Rfl:    diclofenac Sodium (VOLTAREN) 1 % GEL, Apply 2 g topically at bedtime. Apply to both knees twice daily, Disp: 350 g, Rfl: 0   DULoxetine (CYMBALTA) 30 MG capsule, Take 90 mg by mouth daily., Disp: , Rfl:    finasteride (PROSCAR) 5 MG tablet, Take 1 tablet (5 mg total) by mouth in the morning., Disp: 90 tablet, Rfl: 3   fluticasone (FLONASE) 50 MCG/ACT nasal spray, Place 2 sprays into both nostrils daily. Use for 4-6 weeks then stop and use seasonally or as needed., Disp: 16 g, Rfl: 3   losartan (COZAAR) 50 MG tablet, Take 50 mg by mouth daily., Disp: , Rfl:    meclizine (ANTIVERT) 12.5 MG tablet, Take 1 tablet (12.5 mg total) by mouth 3 (three) times daily., Disp: 20 tablet, Rfl:  0   Melatonin 5 MG CAPS, Take 1 capsule (5 mg total) by mouth at bedtime., Disp: , Rfl: 0   OLANZapine (ZYPREXA) 2.5 MG tablet, Take 2.5 mg by mouth 2 (two) times daily., Disp: , Rfl:    pantoprazole (PROTONIX) 40 MG tablet, Take 1 tablet (40 mg total) by mouth daily. (Patient taking differently: Take 40 mg by mouth 2 (two) times daily before a meal.), Disp: 90 tablet, Rfl: 3   prednisoLONE acetate (PRED FORTE) 1 % ophthalmic suspension, Place 1 drop into the left eye 2 (two) times daily., Disp: 5 mL, Rfl: 0   tamsulosin (FLOMAX) 0.4 MG CAPS capsule, Take 1 capsule (0.4 mg total) by mouth daily., Disp: 90 capsule, Rfl: 0   traZODone (DESYREL) 150 MG tablet, Take 150 mg by mouth at bedtime as needed., Disp: , Rfl:   Observations/Objective: Telephone visit only  No labored breathing.  Speech is clear and coherent with logical content.  Patient is alert  and oriented at baseline.    Assessment and Plan: 1. Eustachian tube dysfunction, bilateral Continue Flonase   - amoxicillin (AMOXIL) 875 MG tablet; Take 1 tablet (875 mg total) by mouth 2 (two) times daily for 10 days.  Dispense: 20 tablet; Refill: 0  2. Benign paroxysmal positional vertigo, unspecified laterality Follow up with PCP regarding Physical Therapy referral       Follow Up Instructions: I discussed the assessment and treatment plan with the patient. The patient was provided an opportunity to ask questions and all were answered. The patient agreed with the plan and demonstrated an understanding of the instructions.  A copy of instructions were sent to the patient via MyChart unless otherwise noted below.    The patient was advised to call back or seek an in-person evaluation if the symptoms worsen or if the condition fails to improve as anticipated.  Time:  I spent 10 minutes with the patient via telehealth technology discussing the above problems/concerns.    Aaron Schneiders, FNP

## 2022-02-17 ENCOUNTER — Ambulatory Visit: Payer: Medicare Other

## 2022-02-22 ENCOUNTER — Other Ambulatory Visit: Payer: Medicare Other | Admitting: Nurse Practitioner

## 2022-02-22 ENCOUNTER — Other Ambulatory Visit: Payer: Self-pay | Admitting: Family Medicine

## 2022-02-22 DIAGNOSIS — R296 Repeated falls: Secondary | ICD-10-CM

## 2022-02-22 DIAGNOSIS — Z515 Encounter for palliative care: Secondary | ICD-10-CM

## 2022-02-22 DIAGNOSIS — H547 Unspecified visual loss: Secondary | ICD-10-CM

## 2022-02-22 DIAGNOSIS — M170 Bilateral primary osteoarthritis of knee: Secondary | ICD-10-CM

## 2022-02-22 DIAGNOSIS — R531 Weakness: Secondary | ICD-10-CM

## 2022-02-22 DIAGNOSIS — R5381 Other malaise: Secondary | ICD-10-CM

## 2022-02-22 DIAGNOSIS — R29898 Other symptoms and signs involving the musculoskeletal system: Secondary | ICD-10-CM

## 2022-02-22 DIAGNOSIS — R2681 Unsteadiness on feet: Secondary | ICD-10-CM

## 2022-02-22 DIAGNOSIS — R0602 Shortness of breath: Secondary | ICD-10-CM

## 2022-02-22 DIAGNOSIS — J449 Chronic obstructive pulmonary disease, unspecified: Secondary | ICD-10-CM

## 2022-02-22 NOTE — Progress Notes (Addendum)
[facility] Consult Note Telephone: [phone]  Fax: [phone]   Date of encounter: 02/22/22 9:44 PM PATIENT NAME: Aaron Gibbs [address] [place] [address]   [phone] (home)  DOB: December 11, 1934 MRN: [id number] PRIMARY CARE PROVIDER:    Olin Hauser, DO,  [place] [address] [phone]  REFERRING PROVIDER:   Olin Hauser, DO [address] [place],   [address] [phone]  RESPONSIBLE PARTY:    Contact Information     Name Relation Home Work Mobile   [name] Daughter [phone]  [phone]   [name] Daughter [phone]  [phone]   [name] Other   [phone]      I met face to face with patient and family in home. Palliative Care was asked to follow this patient by consultation request of  [name] * to address advance care planning and complex medical decision making. This is the initial visit.                          ASSESSMENT AND PLAN / RECOMMENDATIONS:  Advance Care Planning/Goals of Care: Goals include to maximize quality of life and symptom management. Patient/health care surrogate gave his/her permission to discuss.Our advance care planning conversation included a discussion about:    The value and importance of advance care planning  Experiences with loved ones who have been seriously ill or have died  Exploration of personal, cultural or spiritual beliefs that might influence medical decisions  Exploration of goals of care in the event of a sudden injury or illness  Identification  of a healthcare agent  Review and updating or creation of an  advance directive document . Decision not to resuscitate or to de-escalate disease focused treatments due to poor prognosis. CODE STATUS: DNR  Symptom Management/Plan: 1. Advance Care Planning;  DNR, golden rod form completed left in home, put in vynca; Discussed with [name], option  of Hospice benefit under Medicare, wishes are to have Hospice evaluate for eligibility.   2. Shortness of breath secondary to COPD repeated hospitalizations >15 in less than 6 months; out of 150 days he has spent 60 days either in ED/in-patient or behavioral health units in [place]. Then hospitalization [facility] (01/29/2022 to 01/30/2022). Currently not on O2 though very short of breath with only being able to ambulate about 5 to 7 feet prior to resting on his rolaid walker. Talked about inhalation therapy, current exhaustion with any type of exertion. Short of breath with speaking, has to stop after 2 sentences to "catch his breath".   01/30/2022 sodium 139; potassium 3.6; chloride 108, co2 25, bun 24, creatinine 1.20, glucose 98;   3. Debility/generalized weakness/unsteady gait ongoing decline. secondary to decompensation from hospitalization with COPD, vertigo severe, high fall risk. We talked about safety in environment, Discussed caregivers; We talked about requires assistance for ADL's.   07/25/2021 weight 208 lbs; BMI 30 01/22/2022 weight 199 lbs Edematous  He does have pending appointment with  Dr Milinda [facility] pain clinic  4. Goals of Care: Goals include to maximize quality of life and symptom management. Our advance care planning conversation included a discussion about:    The value and importance of advance care planning  Exploration of personal, cultural or spiritual beliefs that might influence medical decisions  Exploration of goals of care in the event of a sudden injury or illness  Identification and preparation of a healthcare agent  Review and  updating or creation of an advance directive document.  5. Palliative care encounter; Palliative care encounter; Palliative medicine team will continue to support patient, patient's family, and medical team. Visit consisted of counseling and education dealing with the complex and emotionally intense issues of symptom management and  palliative care in the setting of serious and potentially life-threatening illness Follow up Palliative Care Visit: Palliative care will continue to follow for complex medical decision making, advance care planning, and clarification of goals.   I spent 62 minutes providing this consultation. More than 50% of the time in this consultation was spent in counseling and care coordination. PPS: 50%  Chief Complaint: Follow up palliative consult for complex medical decision making, address goals, manage ongoing symptoms  HISTORY OF PRESENT ILLNESS:  Aaron Gibbs is a 87 y.o. year old male  with multiple medical problems including COPD, chronic respiratory failure, PVD, aortic atherosclerosis, obesity, HLD, HTN, GERD, OA, AVM of colon, IDA, anxiety, depression, insomnia, legally blind, h/o alcohol and drug abuse, h/o sucidal ideations/attempt (04/2018).  Hospitalized 01/29/2022 to 01/30/2022 for shortness of breath, dizziness, vertigo suspected benign positional, put on meclizine. I visited Aaron Gibbs, daughter [name] and caregiver [name] in their home. 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 Recently moved to [place] to live with daughter. Aaron Gibbs has had over 15 hospitalizations in 6 months for medical, psychiatric with records not currently available. When Aaron Gibbs arrived in [place], went straight for hospitalization as above. We talked about past medical history, ros including sob, pain in bilateral knees. We talked about functional changes ambulating with rolaid walker only a few feet before having to rest. During PC visit, Aaron Gibbs becomes short of breath with multiple sentences in a row, having to stop to rest. Aaron Gibbs is unsteady with his gait. Requires assistance with adl;s as he becomes very sob, exhausted when attempting to perform adl's. We talked about appetite, edema which he does have BLE, abdomen, eats many  snacks, junk food in between meals. Aaron Gibbs appears pale, severely debilitated, mildly to moderate sob intermit throughout [facility] visit. We talked about medical goals, wishes DNR, golden rod completed. We talked about quality of life. We talked about overall management of chronic disease. Discussed f/u pc visit if not admitted to hospice, scheduled. Therapeutic listening, emotional support provided. Questions answered. Contact information provided.   History obtained from review of EMR, discussion with daughters Elesa Hacker with Aaron Gibbs.  I reviewed available labs, medications, imaging, studies and related documents from the EMR.  Records reviewed and summarized above.   ROS 10 point system reviewed except HPI  Physical Exam: Constitutional: NAD General: frail appearing, elderly male EYES:  lids intact ENMT: oral mucous membranes moist CV: S1S2, RRR Pulmonary: decrease breath sounds, few wheezes Abdomen: soft and non tender MSK: ambulatory with walker Skin: warm and dry Neuro:  + generalized weakness,  no cognitive impairment Psych: non-anxious affect, A and O x 3 CURRENT PROBLEM LIST:  Patient Active Problem List   Diagnosis Date Noted   Benign paroxysmal positional vertigo    Weakness 01/29/2022   Chronic knee pain (Left) 09/28/2020   Abnormal MRI, knee (08/05/2018) (Left) 09/13/2020   Long-term current use of benzodiazepine 08/09/2020   Opioid dependence, binge pattern ([place]) 08/09/2020   Opioid dependence with or without physiological dependence ([place]) 08/08/2020   History of substance use disorder 04/15/2020   Osteoarthritis of knee (Left) 03/23/2020   Aortic atherosclerosis ([place])  02/05/2020   Generalized anxiety disorder with panic attacks 02/04/2020   Depression with anxiety 12/13/2019   Depression 12/12/2019   HLD (hyperlipidemia)    Iron deficiency anemia    Fall at home, initial encounter    Acute postoperative pain 06/10/2019   Medial meniscal tear, sequela  (Left) 06/10/2019   Patellar tendinosis (Right) 06/10/2019   Lateral meniscal tear, sequela (Left) 03/03/2019   Palpitation 02/13/2019   Preop testing 11/14/2018   Noncompliance with medication treatment due to overuse of medication 10/23/2018   Neurogenic pain 08/20/2018   Atherosclerotic peripheral vascular disease ([place]) 07/24/2018   Tricompartment osteoarthritis of knee (Left) 07/24/2018   Osteoarthritis of knee (Bilateral) 07/24/2018   Osteoarthritis of patellofemoral joint (Right) 07/24/2018   History of suicide attempt (06/12/18) 07/24/2018   Long term current use of opiate analgesic 07/10/2018   Pharmacologic therapy 07/10/2018   Disorder of skeletal system 07/10/2018   Problems influencing health status 07/10/2018   Suicide attempt ([place]) (05/13/18) 15/52/0802   Acute metabolic encephalopathy 23/36/1224   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   Major depressive disorder, recurrent episode, severe ([place]) 06/04/2018   Blindness 05/10/2018   Suicidal ideation 05/10/2018   Chronic pain syndrome 05/01/2018   DISH (diffuse idiopathic skeletal hyperostosis) 05/01/2018   Osteoarthritis of multiple joints 05/01/2018   Chronic knee pain (1ry area of Pain) (Bilateral) (L>R) 05/01/2018   Retinitis pigmentosa of both eyes 05/01/2018   Glaucoma of both eyes 05/01/2018   Essential hypertension 05/01/2018   Chronic low back pain (Bilateral) w/o sciatica 05/01/2018   GERD (gastroesophageal reflux disease) 05/01/2018   AVM (arteriovenous malformation) of colon 05/01/2018   Therapeutic opioid-induced constipation (OIC) 05/01/2018   PAST MEDICAL HISTORY:  Active Ambulatory Problems    Diagnosis Date Noted   Chronic pain syndrome 05/01/2018   DISH (diffuse idiopathic skeletal hyperostosis) 05/01/2018   Osteoarthritis of multiple joints 05/01/2018   Chronic knee pain (1ry area of Pain) (Bilateral) (L>R) 05/01/2018   Retinitis pigmentosa of both eyes 05/01/2018   Glaucoma of both eyes  05/01/2018   Essential hypertension 05/01/2018   Chronic low back pain (Bilateral) w/o sciatica 05/01/2018   GERD (gastroesophageal reflux disease) 05/01/2018   AVM (arteriovenous malformation) of colon 05/01/2018   Therapeutic opioid-induced constipation (OIC) 05/01/2018   Blindness 05/10/2018   Suicidal ideation 49/75/3005   Acute metabolic encephalopathy 04/20/1116   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   Major depressive disorder, recurrent episode, severe ([place]) 06/04/2018   Suicide attempt ([place]) (05/13/18) 06/26/2018   Long term current use of opiate analgesic 07/10/2018   Pharmacologic therapy 07/10/2018   Disorder of skeletal system 07/10/2018   Problems influencing health status 07/10/2018   Atherosclerotic peripheral vascular disease ([place]) 07/24/2018   Tricompartment osteoarthritis of knee (Left) 07/24/2018   Osteoarthritis of knee (Bilateral) 07/24/2018   Osteoarthritis of patellofemoral joint (Right) 07/24/2018   History of suicide attempt (06/12/18) 07/24/2018   Neurogenic pain 08/20/2018   Noncompliance with medication treatment due to overuse of medication 10/23/2018   Preop testing 11/14/2018   Palpitation 02/13/2019   Lateral meniscal tear, sequela (Left) 03/03/2019   Acute postoperative pain 06/10/2019   Medial meniscal tear, sequela (Left) 06/10/2019   Patellar tendinosis (Right) 06/10/2019   HLD (hyperlipidemia)    Iron deficiency anemia    Fall at home, initial encounter    Depression with anxiety 12/13/2019   Depression 12/12/2019   Generalized anxiety disorder with panic attacks 02/04/2020   Aortic atherosclerosis ([place]) 02/05/2020   Osteoarthritis of knee (Left) 03/23/2020   History  of substance use disorder 04/15/2020   Opioid dependence with or without physiological dependence ([place]) 08/08/2020   Long-term current use of benzodiazepine 08/09/2020   Opioid dependence, binge pattern ([place]) 08/09/2020   Abnormal MRI, knee (08/05/2018) (Left) 09/13/2020    Chronic knee pain (Left) 09/28/2020   Weakness 01/29/2022   Benign paroxysmal positional vertigo    Resolved Ambulatory Problems    Diagnosis Date Noted   Centrilobular emphysema ([place]) 05/01/2018   Community acquired pneumonia 03/29/2019   Acute respiratory failure with hypoxia ([place]) 04/09/2019   Acute exacerbation of chronic obstructive pulmonary disease (COPD) ([place]) 04/09/2019   COPD (chronic obstructive pulmonary disease) ([place])    Aspiration pneumonia ([place]) 09/25/2019   AKI (acute kidney injury) ([place]) 12/13/2019   Acute kidney failure ([place]) 12/14/2019   Altered mental status 01/29/2022   Past Medical History:  Diagnosis Date   Anxiety    Cancer ([place])    Glaucoma    Osteoporosis    SOCIAL HX:  Social History   Tobacco Use   Smoking status: Former    Years: 20.00    Types: Cigarettes   Smokeless tobacco: Current    Types: Chew   Tobacco comments:    stopped 15 years ago  Substance Use Topics   Alcohol use: Not Currently    Comment: past   FAMILY HX:  Family History  Problem Relation Age of Onset   Depression Mother    Heart disease Father       ALLERGIES:  Allergies  Allergen Reactions   Azithromycin Shortness Of Breath     PERTINENT MEDICATIONS:  Outpatient Encounter Medications as of 02/22/2022  Medication Sig   acetaminophen (TYLENOL) 500 MG tablet Take 1,000 mg by mouth in the morning, at noon, and at bedtime.   albuterol (VENTOLIN HFA) 108 (90 Base) MCG/ACT inhaler INHALE 1-2 PUFFS INTO THE LUNGS EVERY 6 HOURS AS NEEDED   amitriptyline (ELAVIL) 10 MG tablet Take 10 mg by mouth at bedtime.   amoxicillin (AMOXIL) 875 MG tablet Take 1 tablet (875 mg total) by mouth 2 (two) times daily for 10 days.   bumetanide (BUMEX) 1 MG tablet Take 0.5 mg by mouth 2 (two) times daily.   buprenorphine (BUTRANS) 10 MCG/HR PTWK Place 1 patch onto the skin once a week for 28 days. Apply only 1 patch at a time and alternate sites weekly.   carvedilol (COREG) 12.5 MG tablet  Take 12.5 mg by mouth 2 (two) times daily.   cetirizine (ZYRTEC) 10 MG tablet Take 10 mg by mouth daily.   COSOPT 22.3-6.8 MG/ML ophthalmic solution Place 1 drop into both eyes 2 (two) times daily.   diclofenac Sodium (VOLTAREN) 1 % GEL Apply 2 g topically at bedtime. Apply to both knees twice daily   DULoxetine (CYMBALTA) 30 MG capsule Take 90 mg by mouth daily.   finasteride (PROSCAR) 5 MG tablet Take 1 tablet (5 mg total) by mouth in the morning.   fluticasone (FLONASE) 50 MCG/ACT nasal spray Place 2 sprays into both nostrils daily. Use for 4-6 weeks then stop and use seasonally or as needed.   losartan (COZAAR) 50 MG tablet Take 50 mg by mouth daily.   meclizine (ANTIVERT) 12.5 MG tablet Take 1 tablet (12.5 mg total) by mouth 3 (three) times daily.   Melatonin 5 MG CAPS Take 1 capsule (5 mg total) by mouth at bedtime.   OLANZapine (ZYPREXA) 2.5 MG tablet Take 2.5 mg by mouth 2 (two) times daily.   pantoprazole (PROTONIX) 40  MG tablet Take 1 tablet (40 mg total) by mouth daily. (Patient taking differently: Take 40 mg by mouth 2 (two) times daily before a meal.)   prednisoLONE acetate (PRED FORTE) 1 % ophthalmic suspension Place 1 drop into the left eye 2 (two) times daily.   tamsulosin (FLOMAX) 0.4 MG CAPS capsule Take 1 capsule (0.4 mg total) by mouth daily.   traZODone (DESYREL) 150 MG tablet Take 150 mg by mouth at bedtime as needed.   No facility-administered encounter medications on file as of 02/22/2022.   Thank you for the opportunity to participate in the care of Aaron Gibbs.  The palliative care team will continue to follow. Please call our office at [phone] if we can be of additional assistance.   Xavious Sharrar Z Daequan Kozma, NP ,

## 2022-02-26 NOTE — Progress Notes (Unsigned)
PROVIDER NOTE: Information contained herein reflects review and annotations entered in association with encounter. Interpretation of such information and data should be left to medically-trained personnel. Information provided to patient can be located elsewhere in the medical record under "Patient Instructions". Document created using STT-dictation technology, any transcriptional errors that may result from process are unintentional.    Patient: Aaron Gibbs  Service Category: E/M  Provider: Gaspar Cola, MD  DOB: 1935/01/06  DOS: 02/27/2022  Referring Provider: [name] *  MRN: [id number]  Specialty: Interventional Pain Management  PCP: Olin Hauser, DO  Type: Established Patient  Setting: Ambulatory outpatient    Location: Office  Delivery: Face-to-face     HPI  Aaron Gibbs, a 87 y.o. year old male, is here today because of his Chronic pain syndrome [G89.4]. Aaron Gibbs primary complain today is No chief complaint on file. Last encounter: My last encounter with him was on Visit date not found. Pertinent problems: Aaron Gibbs has Chronic pain syndrome; DISH (diffuse idiopathic skeletal hyperostosis); Osteoarthritis of multiple joints; Chronic knee pain (1ry area of Pain) (Bilateral) (L>R); Chronic low back pain (Bilateral) w/o sciatica; Somatic symptom disorder; Tricompartment osteoarthritis of knee (Left); Osteoarthritis of knee (Bilateral); Osteoarthritis of patellofemoral joint (Right); Neurogenic pain; Lateral meniscal tear, sequela (Left); Medial meniscal tear, sequela (Left); Patellar tendinosis (Right); Osteoarthritis of knee (Left); Abnormal MRI, knee (08/05/2018) (Left); and Chronic knee pain (Left) on their pertinent problem list. Pain Assessment: Severity of   is reported as a  /10. Location:    / . Onset:  . Quality:  . Timing:  . Modifying factor(s):  Marland Kitchen Vitals:  vitals were not taken for this visit.   Reason for encounter: evaluation of  worsening, or previously known (established) problem. ***  Routine UDS ordered today.   RTCB: 05/22/2022 Nonopioid transferred 11/02/2020: Voltaren gel  Pharmacotherapy Assessment  Analgesic: No opioid analgesics prescribed by our practice.  Buprenorphine 10 mcg/h patch q. 7 days.  High risk for SUD.  History of suicidal attempts and noncompliance with medication intake. ED visits thought to be due to medication binging. MME/day: 0 mg/day.   Monitoring: [place] PMP: PDMP reviewed during this encounter.       Pharmacotherapy: No side-effects or adverse reactions reported. Compliance: No problems identified. Effectiveness: Clinically acceptable.  No notes on file  No results found for: "CBDTHCR" No results found for: "D8THCCBX" No results found for: "D9THCCBX"  UDS:  Summary  Date Value Ref Range Status  01/26/2021 Note  Final    Comment:    ==================================================================== [employer] [facility] (MW) ==================================================================== Test                             Result       Flag       Units  Drug Present and Declared for Prescription Verification   Lorazepam                      796          EXPECTED   ng/mg creat    Source of lorazepam is a scheduled prescription medication.    Buprenorphine                  3            EXPECTED   ng/mg creat    Sources of buprenorphine include scheduled prescription medications.  Drug Absent but Declared for Prescription Verification  Tramadol                       Not Detected UNEXPECTED ng/mg creat ==================================================================== Test                      Result    Flag   Units      Ref Range   Creatinine              156              mg/dL      >=20 ==================================================================== Declared Medications:  The flagging and interpretation on this report are based on the  following declared  medications.  Unexpected results may arise from  inaccuracies in the declared medications.   **Note: The testing scope of this panel includes these medications:   Lorazepam (Ativan)  Tramadol (Ultram)   **Note: The testing scope of this panel does not include small to  moderate amounts of these reported medications:   Buprenorphine Patch (BuTrans)   **Note: The testing scope of this panel does not include the  following reported medications:   Acetaminophen (Tylenol)  Albuterol (Ventolin HFA)  Amlodipine (Norvasc)  Azelastine  Diclofenac (Voltaren)  Dorzolamide  Duloxetine (Cymbalta)  Fenofibrate (TriCor)  Fluticasone (Flonase)  Hydroxyzine (Atarax)  Ibuprofen (Advil)  Iron  Montelukast (Singulair)  Multivitamin  Mupirocin (Bactroban)  Pantoprazole (Protonix)  Prednisolone  Quetiapine (Seroquel)  Risperidone (Risperdal)  Tamsulosin (Flomax)  Trazodone (Desyrel) ==================================================================== For clinical consultation, please call [phone]. ====================================================================       ROS  Constitutional: Denies any fever or chills Gastrointestinal: No reported hemesis, hematochezia, vomiting, or acute GI distress Musculoskeletal: Denies any acute onset joint swelling, redness, loss of ROM, or weakness Neurological: No reported episodes of acute onset apraxia, aphasia, dysarthria, agnosia, amnesia, paralysis, loss of coordination, or loss of consciousness  Medication Review  DULoxetine, Melatonin, OLANZapine, acetaminophen, albuterol, amitriptyline, bumetanide, buprenorphine, carvedilol, cetirizine, diclofenac Sodium, dorzolamide-timolol, finasteride, fluticasone, losartan, meclizine, pantoprazole, prednisoLONE acetate, tamsulosin, and traZODone  History Review  Allergy: Aaron Gibbs is allergic to azithromycin. Drug: Aaron Gibbs  reports no history of drug use. Alcohol:  reports that he  does not currently use alcohol. Tobacco:  reports that he has quit smoking. His smoking use included cigarettes. His smokeless tobacco use includes chew. Social: Aaron Gibbs  reports that he has quit smoking. His smoking use included cigarettes. His smokeless tobacco use includes chew. He reports that he does not currently use alcohol. He reports that he does not use drugs. Medical:  has a past medical history of Anxiety, Cancer ([place]), COPD (chronic obstructive pulmonary disease) ([place]), Glaucoma, and Osteoporosis. Surgical: Aaron Gibbs  has a past surgical history that includes Cholecystectomy and Transurethral resection of prostate (N/A, 2008). Family: family history includes Depression in his mother; Heart disease in his father.  Laboratory Chemistry Profile   Renal Lab Results  Component Value Date   BUN 24 (H) 01/30/2022   CREATININE 1.20 01/30/2022   LABCREA 631 12/13/2019   BCR 28 (H) 07/10/2018   GFRAA >60 02/09/2020   GFRNONAA 60 (L) 01/30/2022    Hepatic Lab Results  Component Value Date   AST 25 01/29/2022   ALT 12 01/29/2022   ALBUMIN 3.5 01/29/2022   ALKPHOS 66 01/29/2022   LIPASE 24 01/22/2022   AMMONIA 25 09/13/2019    Electrolytes Lab Results  Component Value Date   NA 139 01/30/2022   K 3.6 01/30/2022   CL  108 01/30/2022   CALCIUM 8.9 01/30/2022   MG 2.0 01/22/2022   PHOS 3.4 03/30/2019    Bone Lab Results  Component Value Date   VD25OH 26 08/15/2016   25OHVITD1 34 07/10/2018   25OHVITD2 <1.0 07/10/2018   25OHVITD3 34 07/10/2018    Inflammation (CRP: Acute Phase) (ESR: Chronic Phase) Lab Results  Component Value Date   CRP 5 07/10/2018   ESRSEDRATE 20 07/10/2018   LATICACIDVEN 0.9 01/29/2022         Note: Above Lab results reviewed.  Recent Imaging Review  CT Head Wo Contrast CLINICAL DATA:  87 year old male with altered mental status.  EXAM: CT HEAD WITHOUT CONTRAST  TECHNIQUE: Contiguous axial images were obtained from the base of  the skull through the vertex without intravenous contrast.  RADIATION DOSE REDUCTION: This exam was performed according to the departmental dose-optimization program which includes automated exposure control, adjustment of the mA and/or kV according to patient size and/or use of iterative reconstruction technique.  COMPARISON:  Head CT 01/22/2022.  Brain MRI 09/13/2019.  FINDINGS: Brain: Cerebral volume appears stable and within normal limits for age. No midline shift, ventriculomegaly, mass effect, evidence of mass lesion, intracranial hemorrhage or evidence of cortically based acute infarction. Patchy bilateral white matter hypodensity does not appear significantly changed from a 2021 MRI. No cortical encephalomalacia identified.  Vascular: Calcified atherosclerosis at the skull base. No suspicious intracranial vascular hyperdensity.  Skull: No acute osseous abnormality identified.  Sinuses/Orbits: Visualized paranasal sinuses and mastoids are stable and well aerated.  Other: No acute orbit or scalp soft tissue finding.  IMPRESSION: Stable mild for age chronic white matter changes. No acute intracranial abnormality.  Electronically Signed   By: Genevie Ann M.D.   On: 01/29/2022 11:27 DG Chest 2 View CLINICAL DATA:  Shortness of breath. Dizziness.  EXAM: CHEST - 2 VIEW  COMPARISON:  One-view chest x-ray 01/22/2022  FINDINGS: The heart size is normal. Ill-defined airspace opacity is present at the left lower lobe. Lungs are otherwise clear. Ankylosis of the thoracic spine again noted.  IMPRESSION: Ill-defined airspace opacity at the left base. While this may represent atelectasis, early infection is not excluded.  Electronically Signed   By: San Morelle M.D.   On: 01/29/2022 11:07 Note: Reviewed        Physical Exam  General appearance: Well nourished, well developed, and well hydrated. In no apparent acute distress Mental status: Alert, oriented x 3  (person, place, & time)       Respiratory: No evidence of acute respiratory distress Eyes: PERLA Vitals: There were no vitals taken for this visit. BMI: Estimated body mass index is 28.7 kg/m as calculated from the following:   Height as of 02/03/22: '5\' 10"'  (1.778 m).   Weight as of 02/03/22: 200 lb (90.7 kg). Ideal: Patient weight not recorded  Assessment   Diagnosis Status  1. Chronic pain syndrome   2. Chronic knee pain (1ry area of Pain) (Bilateral) (L>R)   3. DISH (diffuse idiopathic skeletal hyperostosis)   4. Chronic knee pain (Left)   5. Lateral meniscal tear, sequela (Left)   6. Medial meniscal tear, sequela (Left)   7. Osteoarthritis of knee (Left)   8. Tricompartment osteoarthritis of knee (Left)   9. Chronic low back pain (Bilateral) w/o sciatica   10. Pharmacologic therapy   11. Chronic use of opiate for therapeutic purpose   12. Encounter for medication management   13. Encounter for chronic pain management    Controlled Controlled  Controlled   Updated Problems: Problem  Sensorineural Hearing Loss (Snhl) of Both Ears  Pain in Right Knee  Other Chronic Pain  Acute Postoperative Pain (Resolved)    Plan of Care  Problem-specific:  No problem-specific Assessment & Plan notes found for this encounter.  Aaron Gibbs has a current medication list which includes the following long-term medication(s): albuterol, amitriptyline, bumetanide, buprenorphine, carvedilol, cetirizine, diclofenac sodium, duloxetine, fluticasone, losartan, pantoprazole, and trazodone.  Pharmacotherapy (Medications Ordered): No orders of the defined types were placed in this encounter.  Orders:  No orders of the defined types were placed in this encounter.  Follow-up plan:   No follow-ups on file.     Interventional Therapies  Risk  Complexity Considerations:   Patient is legally blind. High risk for substance use disorder.  The patient has documented suicidal attempt using  medications.  In addition, we have documented the patient to have a binge use pattern and been noncompliant with the instructions on how to use the medications.  This is a reason why went to a buprenorphine patch.   Planned  Pending:   Repeat bilateral genicular nerve RFA #3    Under consideration:   Palliative bilateral genicular nerve RFA maintenance treatments   Completed:   Palliative bilateral IA Hyalgan knee injections Sx2  Diagnostic bilateral genicular NB x1  Palliative right genicular nerve RFA x2 (04/06/2020) (100/100/50/100)  Palliative left genicular nerve RFA x3 (09/28/2020) (100/50/50/90)    Therapeutic  Palliative (PRN) options:   Palliative bilateral IA Hyalgan knee injections S3/N1  Diagnostic bilateral genicular NB #2  Palliative bilateral genicular nerve RFA #3     Recent Visits No visits were found meeting these conditions. Showing recent visits within past 90 days and meeting all other requirements Future Appointments Date Type Provider Dept  02/27/22 Appointment Milinda Pointer, MD [facility]  Showing future appointments within next 90 days and meeting all other requirements  I discussed the assessment and treatment plan with the patient. The patient was provided an opportunity to ask questions and all were answered. The patient agreed with the plan and demonstrated an understanding of the instructions.  Patient advised to call back or seek an in-person evaluation if the symptoms or condition worsens.  Duration of encounter: *** minutes.  Total time on encounter, as per AMA guidelines included both the face-to-face and non-face-to-face time personally spent by the physician and/or other qualified health care professional(s) on the day of the encounter (includes time in activities that require the physician or other qualified health care professional and does not include time in activities normally performed by clinical staff). Physician's time may  include the following activities when performed: preparing to see the patient (eg, review of tests, pre-charting review of records) obtaining and/or reviewing separately obtained history performing a medically appropriate examination and/or evaluation counseling and educating the patient/family/caregiver ordering medications, tests, or procedures referring and communicating with other health care professionals (when not separately reported) documenting clinical information in the electronic or other health record independently interpreting results (not separately reported) and communicating results to the patient/ family/caregiver care coordination (not separately reported)  Note by: Gaspar Cola, MD Date: 02/27/2022; Time: 10:57 AM

## 2022-02-27 ENCOUNTER — Ambulatory Visit
Admission: RE | Admit: 2022-02-27 | Discharge: 2022-02-27 | Disposition: A | Discharge status: Home / Self Care | Payer: Medicare Other | Source: Ambulatory Visit | Attending: Pain Medicine | Admitting: Pain Medicine

## 2022-02-27 ENCOUNTER — Encounter: Payer: Self-pay | Admitting: Pain Medicine

## 2022-02-27 ENCOUNTER — Ambulatory Visit (HOSPITAL_BASED_OUTPATIENT_CLINIC_OR_DEPARTMENT_OTHER): Payer: Medicare Other | Admitting: Pain Medicine

## 2022-02-27 VITALS — BP 176/102 | HR 73 | Temp 97.1°F | Resp 18 | Ht 70.0 in | Wt 220.0 lb

## 2022-02-27 DIAGNOSIS — M4810 Ankylosing hyperostosis [Forestier], site unspecified: Secondary | ICD-10-CM | POA: Insufficient documentation

## 2022-02-27 DIAGNOSIS — M545 Low back pain, unspecified: Secondary | ICD-10-CM | POA: Insufficient documentation

## 2022-02-27 DIAGNOSIS — M25561 Pain in right knee: Secondary | ICD-10-CM | POA: Diagnosis not present

## 2022-02-27 DIAGNOSIS — Z79891 Long term (current) use of opiate analgesic: Secondary | ICD-10-CM | POA: Insufficient documentation

## 2022-02-27 DIAGNOSIS — G8929 Other chronic pain: Secondary | ICD-10-CM

## 2022-02-27 DIAGNOSIS — M25562 Pain in left knee: Secondary | ICD-10-CM | POA: Insufficient documentation

## 2022-02-27 DIAGNOSIS — M170 Bilateral primary osteoarthritis of knee: Secondary | ICD-10-CM

## 2022-02-27 DIAGNOSIS — S83282S Other tear of lateral meniscus, current injury, left knee, sequela: Secondary | ICD-10-CM | POA: Insufficient documentation

## 2022-02-27 DIAGNOSIS — R936 Abnormal findings on diagnostic imaging of limbs: Secondary | ICD-10-CM | POA: Insufficient documentation

## 2022-02-27 DIAGNOSIS — Z79899 Other long term (current) drug therapy: Secondary | ICD-10-CM | POA: Insufficient documentation

## 2022-02-27 DIAGNOSIS — S83242S Other tear of medial meniscus, current injury, left knee, sequela: Secondary | ICD-10-CM | POA: Insufficient documentation

## 2022-02-27 DIAGNOSIS — M1712 Unilateral primary osteoarthritis, left knee: Secondary | ICD-10-CM | POA: Insufficient documentation

## 2022-02-27 DIAGNOSIS — G894 Chronic pain syndrome: Secondary | ICD-10-CM | POA: Insufficient documentation

## 2022-02-27 MED ORDER — BUPRENORPHINE 10 MCG/HR TD PTWK: 1.0000 | MEDICATED_PATCH | TRANSDERMAL | 0 refills | Status: DC

## 2022-02-27 NOTE — Patient Instructions (Addendum)
____________________________________________________________________________________________  Pharmacy Shortages of Pain Medication   Introduction Shockingly as it may seem, .  "No U.[place] Supreme Court decision has ever interpreted the Constitution as guaranteeing a right to health care for all Americans." - https://huff.com/  "With respect to human rights, the [place] has no formally codified right to health, nor does it participate in a human rights treaty that specifies a right to health." - Scott J. Schweikart, JD, MBE  Situation By now, most of our patients have had the experience of being told by their pharmacist that they do not have enough medication to cover their prescription. If you have not had this experience, just know that you soon will.  Problem There appears to be a shortage of these medications, either at the national level or locally. This is happening with all pharmacies. When there is not enough medication, patients are offered a partial fill and they are told that they will try to get the rest of the medicine for them at a later time. If they do not have enough for even a partial fill, the pharmacists are telling the patients to call us (the prescribing physicians) to request that we send another prescription to another pharmacy to get the medicine.   This reordering of a controlled substance creates documentation problems where additional paperwork needs to be created to explain why two prescriptions for the same period of time and the same medicine are being prescribed to the same patient. It also creates situations where the last appointment note does not accurately reflect when and what prescriptions were given to a patient. This leads to prescribing errors down the line, in subsequent follow-up visits.   [employer] of Pharmacy ([employer]) Research revealed that Surveyor, quantity .1806 (21  NCAC 46.1806) authorizes pharmacists to the transfer of prescriptions among pharmacies, and it sets forth procedural and recordkeeping requirements for doing so. However, this requires the pharmacist to complete the previously mentioned procedural paperwork to accomplish the transfer. As it turns out, it is much easier for them to have the prescribing physicians do the work.   Possible solutions 1. You can ask your physician to assist you in weaning yourself off these medications. 2. Ask your pharmacy if the medication is in stock, 3 days prior to your refill. 3. If you need a pharmacy change, let us know at your medication management visit. Prescriptions that have already been electronically sent to a pharmacy will not be re-sent to a different pharmacy if your pharmacy of record does not have it in stock. Proper stocking of medication is a pharmacy problem, not a prescriber problem. Work with your pharmacist to solve the problem. 4. Have the [facility] Assembly add a provision to the "STOP ACT" (the law that mandates how controlled substances are prescribed) where there is an exception to the electronic prescribing rule that states that in the event there are shortages of medications the physicians are allowed to use written prescriptions as opposed to electronic ones. This would allow patients to take their prescriptions to a different pharmacy that may have enough medication available to fill the prescription. The problem is that currently there is a law that does not allow for written prescriptions, with the exception of instances where the electronic medical record is down due to technical issues.  5. Have [place] Congress ease the pressure on pharmaceutical companies, allowing them to produce enough quantities of the medication to adequately supply the population. 6. Have pharmacies keep enough  stocks of these medications to cover their client base.  7. Have the [facility] Assembly add  a provision to the "STOP ACT" where they ease the regulations surrounding the transfer of controlled substances between pharmacies, so as to simplify the transfer of supplies. As an alternative, develop a system to allow patients to obtain the remainder of their prescription at another one of their pharmacies or at an associate pharmacy.   How this shortage will affect you.  Understand that this is a pharmacy supply problem, not a prescriber problem. Work with your pharmacy to solve it. The job of the prescriber is to evaluate and monitor the patient for the appropriate indications and use of these medicines. It is not the job of the prescriber to supply the medication or to solve problems with that supply. The responsibility and the choice to obtain the medication resides on the patient. By law, supplying the medication is the job of the pharmacy. It is certainly not the job of the prescriber to solve supply problems.   Due to the above problems we are no longer taking patients to write for their pain medication. Future discussions with your physician may include potentially weaning medications or transitioning to alternatives.  We will be focusing primarily on interventional based pain management. We will continue to evaluate for appropriate indications and we may provide recommendations regarding medication, dose, and schedule, as well as monitoring recommendations, however, we will not be taking over the actual prescribing of these substances. On those patients where we are treating their chronic pain with interventional therapies, exceptions will be considered on a case by case basis. At this time, we will try to continue providing this supplemental service to those patients we have been managing in the past. However, as of August 1st, 2023, we no longer will be sending additional prescriptions to other pharmacies for the purpose of solving their supply problems. Once we send a prescription to a pharmacy,  we will not be resending it again to another pharmacy to cover for their shortages.   What to do. Write as many letters as you can. Recruit the help of family members in writing these letters. Below are some of the places where you can write to make your voice heard. Let them know what the problem is and push them to look for solutions.   Search internet for: "[employer] find your legislators" NoseSwap.is  Search internet for: "[employer] commissioner complaints" Starlas.fi  Search internet for: "[place] complaints" https://www.hernandez-brewer.com/.htm  Search internet for: "[facility] pharmacy complaints" Email [facility] Customer Relations woondaal.com.jsp?callType=store  Search internet for: Programme researcher, broadcasting/film/video customer service complaints" https://www.[facility].com/topic/marketing/contactus/contactus_customerservice.jsp  ____________________________________________________________________________________________  ____________________________________________________________________________________________  Medication Rules  Purpose: To inform patients, and their family members, of our rules and regulations.  Applies to: All patients receiving prescriptions (written or electronic).  Pharmacy of record: Pharmacy where electronic prescriptions will be sent. If written prescriptions are taken to a different pharmacy, please inform the nursing staff. The pharmacy listed in the electronic medical record should be the one where you would like electronic prescriptions to be sent.  Electronic prescriptions: In compliance with the [place] (STOP) Act of 2017 (Session Lanny Cramp [phone]), effective June 19, 2018, all controlled substances must be electronically prescribed. Calling prescriptions  to the pharmacy will cease to exist.  Prescription refills: Only during scheduled appointments. Applies to all prescriptions.  NOTE: The following applies primarily to controlled substances (Opioid* Pain Medications).  Type of encounter (visit): For patients receiving controlled substances, face-to-face visits are required. (Not an option or up to the patient.)  Patient's responsibilities: Pain Pills: Bring all pain pills to every appointment (except for procedure appointments). Pill Bottles: Bring pills in original pharmacy bottle. Always bring the newest bottle. Bring bottle, even if empty. Medication refills: You are responsible for knowing and keeping track of what medications you take and those you need refilled. The day before your appointment: write a list of all prescriptions that need to be refilled. The day of the appointment: give the list to the admitting nurse. Prescriptions will be written only during appointments. No prescriptions will be written on procedure days. If you forget a medication: it will not be "Called in", "Faxed", or "electronically sent". You will need to get another appointment to get these prescribed. No early refills. Do not call asking to have your prescription filled early. Prescription Accuracy: You are responsible for carefully inspecting your prescriptions before leaving our office. Have the discharge nurse carefully go over each prescription with you, before taking them home. Make sure that your name is accurately spelled, that your address is correct. Check the name and dose of your medication to make sure it is accurate. Check the number of pills, and the written instructions to make sure they are clear and accurate. Make sure that you are given enough medication to last until your next medication refill appointment. Taking Medication: Take medication as prescribed. When it comes to controlled substances, taking less pills or less frequently than prescribed  is permitted and encouraged. Never take more pills than instructed. Never take medication more frequently than prescribed.  Inform other Doctors: Always inform, all of your healthcare providers, of all the medications you take. Pain Medication from other Providers: You are not allowed to accept any additional pain medication from any other Doctor or Healthcare provider. There are two exceptions to this rule. (see below) In the event that you require additional pain medication, you are responsible for notifying us, as stated below. Cough Medicine: Often these contain an opioid, such as codeine or hydrocodone. Never accept or take cough medicine containing these opioids if you are already taking an opioid* medication. The combination may cause respiratory failure and death. Medication Agreement: You are responsible for carefully reading and following our Medication Agreement. This must be signed before receiving any prescriptions from our practice. Safely store a copy of your signed Agreement. Violations to the Agreement will result in no further prescriptions. (Additional copies of our Medication Agreement are available upon request.) Laws, Rules, & Regulations: All patients are expected to follow all Federal and [employer], [employer], Rules, [employer]. Ignorance of the Laws does not constitute a valid excuse.  Illegal drugs and Controlled Substances: The use of illegal substances (including, but not limited to marijuana and its derivatives) and/or the illegal use of any controlled substances is strictly prohibited. Violation of this rule may result in the immediate and permanent discontinuation of any and all prescriptions being written by our practice. The use of any illegal substances is prohibited. Adopted CDC guidelines & recommendations: Target dosing levels will be at or below 60 MME/day. Use of benzodiazepines** is not recommended.  Exceptions: There are only two exceptions to the rule of not  receiving pain medications from other Healthcare Providers. Exception #1 (Emergencies): In the event of an emergency (i.e.: accident requiring emergency care), you are allowed to receive additional pain medication. However, you are responsible for: As soon as  you are able, call our office [phone], at any time of the day or night, and leave a message stating your name, the date and nature of the emergency, and the name and dose of the medication prescribed. In the event that your call is answered by a member of our staff, make sure to document and save the date, time, and the name of the person that took your information.  Exception #2 (Planned Surgery): In the event that you are scheduled by another doctor or dentist to have any type of surgery or procedure, you are allowed (for a period no longer than 30 days), to receive additional pain medication, for the acute post-op pain. However, in this case, you are responsible for picking up a copy of our "Post-op Pain Management for Surgeons" handout, and giving it to your surgeon or dentist. This document is available at our office, and does not require an appointment to obtain it. Simply go to our office during business hours (Monday-Thursday from 8:00 AM to 4:00 PM) (Friday 8:00 AM to 12:00 Noon) or if you have a scheduled appointment with [place], prior to your surgery, and ask for it by name. In addition, you are responsible for: calling our office [phone], at any time of the day or night, and leaving a message stating your name, name of your surgeon, type of surgery, and date of procedure or surgery. Failure to comply with your responsibilities may result in termination of therapy involving the controlled substances. Medication Agreement Violation. Following the above rules, including your responsibilities will help you in avoiding a Medication Agreement Violation ("Breaking your Pain Medication Contract").  *Opioid medications include: morphine,  codeine, oxycodone, oxymorphone, hydrocodone, hydromorphone, meperidine, tramadol, tapentadol, buprenorphine, fentanyl, methadone. **Benzodiazepine medications include: diazepam (Valium), alprazolam (Xanax), clonazepam (Klonopine), lorazepam (Ativan), clorazepate (Tranxene), chlordiazepoxide (Librium), estazolam (Prosom), oxazepam (Serax), temazepam (Restoril), triazolam (Halcion) (Last updated: 03/16/2021) ____________________________________________________________________________________________  ____________________________________________________________________________________________  Medication Recommendations and Reminders  Applies to: All patients receiving prescriptions (written and/or electronic).  Medication Rules & Regulations: These rules and regulations exist for your safety and that of others. They are not flexible and neither are we. Dismissing or ignoring them will be considered "non-compliance" with medication therapy, resulting in complete and irreversible termination of such therapy. (See document titled "Medication Rules" for more details.) In all conscience, because of safety reasons, we cannot continue providing a therapy where the patient does not follow instructions.  Pharmacy of record:  Definition: This is the pharmacy where your electronic prescriptions will be sent.  We do not endorse any particular pharmacy, however, we have experienced problems with [facility] not securing enough medication supply for the community. We do not restrict you in your choice of pharmacy. However, once we write for your prescriptions, we will NOT be re-sending more prescriptions to fix restricted supply problems created by your pharmacy, or your insurance.  The pharmacy listed in the electronic medical record should be the one where you want electronic prescriptions to be sent. If you choose to change pharmacy, simply notify our nursing staff.  Recommendations: Keep all of your pain  medications in a safe place, under lock and key, even if you live alone. We will NOT replace lost, stolen, or damaged medication. After you fill your prescription, take 1 week's worth of pills and put them away in a safe place. You should keep a separate, properly labeled bottle for this purpose. The remainder should be kept in the original bottle. Use this as your primary supply, until  it runs out. Once it's gone, then you know that you have 1 week's worth of medicine, and it is time to come in for a prescription refill. If you do this correctly, it is unlikely that you will ever run out of medicine. To make sure that the above recommendation works, it is very important that you make sure your medication refill appointments are scheduled at least 1 week before you run out of medicine. To do this in an effective manner, make sure that you do not leave the office without scheduling your next medication management appointment. Always ask the nursing staff to show you in your prescription , when your medication will be running out. Then arrange for the receptionist to get you a return appointment, at least 7 days before you run out of medicine. Do not wait until you have 1 or 2 pills left, to come in. This is very poor planning and does not take into consideration that we may need to cancel appointments due to bad weather, sickness, or emergencies affecting our staff. DO NOT ACCEPT A "Partial Fill": If for any reason your pharmacy does not have enough pills/tablets to completely fill or refill your prescription, do not allow for a "partial fill". The law allows the pharmacy to complete that prescription within 72 hours, without requiring a new prescription. If they do not fill the rest of your prescription within those 72 hours, you will need a separate prescription to fill the remaining amount, which we will NOT provide. If the reason for the partial fill is your insurance, you will need to talk to the pharmacist  about payment alternatives for the remaining tablets, but again, DO NOT ACCEPT A PARTIAL FILL, unless you can trust your pharmacist to obtain the remainder of the pills within 72 hours.  Prescription refills and/or changes in medication(s):  Prescription refills, and/or changes in dose or medication, will be conducted only during scheduled medication management appointments. (Applies to both, written and electronic prescriptions.) No refills on procedure days. No medication will be changed or started on procedure days. No changes, adjustments, and/or refills will be conducted on a procedure day. Doing so will interfere with the diagnostic portion of the procedure. No phone refills. No medications will be "called into the pharmacy". No Fax refills. No weekend refills. No Holliday refills. No after hours refills.  Remember:  Business hours are:  Monday to Thursday 8:00 AM to 4:00 PM Provider's Schedule: Milinda Pointer, MD - Appointments are:  Medication management: Monday and Wednesday 8:00 AM to 4:00 PM Procedure day: Tuesday and Thursday 7:30 AM to 4:00 PM Gillis Santa, MD - Appointments are:  Medication management: Tuesday and Thursday 8:00 AM to 4:00 PM Procedure day: Monday and Wednesday 7:30 AM to 4:00 PM (Last update: 01/07/2020) ____________________________________________________________________________________________  ____________________________________________________________________________________________  CBD (cannabidiol) & Delta-8 (Delta-8 tetrahydrocannabinol) WARNING  Intro: Cannabidiol (CBD) and tetrahydrocannabinol (THC), are two natural compounds found in plants of the Cannabis genus. They can both be extracted from hemp or cannabis. Hemp and cannabis come from the Cannabis sativa plant. Both compounds interact with your body's endocannabinoid system, but they have very different effects. CBD does not produce the high sensation associated with cannabis. Delta-8  tetrahydrocannabinol, also known as delta-8 THC, is a psychoactive substance found in the Cannabis sativa plant, of which marijuana and hemp are two varieties. THC is responsible for the high associated with the illicit use of marijuana.  Applicable to: All individuals currently taking or considering taking CBD (cannabidiol) and,  more important, all patients taking opioid analgesic controlled substances (pain medication). (Example: oxycodone; oxymorphone; hydrocodone; hydromorphone; morphine; methadone; tramadol; tapentadol; fentanyl; buprenorphine; butorphanol; dextromethorphan; meperidine; codeine; etc.)  Legal status: CBD remains a Schedule I drug prohibited for any use. CBD is illegal with one exception. In the [place], CBD has a limited Transport planner (FDA) approval for the treatment of two specific types of epilepsy disorders. Only one CBD product has been approved by the FDA for this purpose: "Epidiolex". FDA is aware that some companies are marketing products containing cannabis and cannabis-derived compounds in ways that violate the [employer], Drug and Cosmetic Act [facility] Act) and that may put the health and safety of consumers at risk. The FDA, a Federal agency, has not enforced the CBD status since 2018. UPDATE: (08/05/2021) The Drug Enforcement Agency ([place]) issued a letter stating that "delta" cannabinoids, including Delta-8-THCO and Delta-9-THCO, synthetically derived from hemp do not qualify as hemp and will be viewed as Schedule I drugs. (Schedule I drugs, substances, or chemicals are defined as drugs with no currently accepted medical use and a high potential for abuse. Some examples of Schedule I drugs are: heroin, lysergic acid diethylamide (LSD), marijuana (cannabis), 3,4-methylenedioxymethamphetamine (ecstasy), methaqualone, and peyote.) (https://jennings.com/)  Legality: Some manufacturers ship CBD products nationally, which is illegal. Often such products are sold  online and are therefore available throughout the country. CBD is openly sold in head shops and health food stores in some states where such sales have not been explicitly legalized. Selling unapproved products with unsubstantiated therapeutic claims is not only a violation of the law, but also can put patients at risk, as these products have not been proven to be safe or effective. Federal illegality makes it difficult to conduct research on CBD.  Reference: "FDA Regulation of Cannabis and Cannabis-Derived Products, Including Cannabidiol (CBD)" - SeekArtists.com.pt  Warning: CBD is not FDA approved and has not undergo the same manufacturing controls as prescription drugs.  This means that the purity and safety of available CBD may be questionable. Most of the time, despite manufacturer's claims, it is contaminated with THC (delta-9-tetrahydrocannabinol - the chemical in marijuana responsible for the "HIGH").  When this is the case, the [facility] contaminant will trigger a positive urine drug screen (UDS) test for Marijuana (carboxy-THC). Because a positive UDS for any illicit substance is a violation of our medication agreement, your opioid analgesics (pain medicine) may be permanently discontinued. The FDA recently put out a warning about 5 things that everyone should be aware of regarding Delta-8 THC: Delta-8 THC products have not been evaluated or approved by the FDA for safe use and may be marketed in ways that put the public health at risk. The FDA has received adverse event reports involving delta-8 THC-containing products. Delta-8 THC has psychoactive and intoxicating effects. Delta-8 THC manufacturing often involve use of potentially harmful chemicals to create the concentrations of delta-8 THC claimed in the marketplace. The final delta-8 THC product may have potentially harmful by-products  (contaminants) due to the chemicals used in the process. Manufacturing of delta-8 THC products may occur in uncontrolled or unsanitary settings, which may lead to the presence of unsafe contaminants or other potentially harmful substances. Delta-8 THC products should be kept out of the reach of children and pets.  MORE ABOUT CBD  General Information: CBD was discovered in [birth year] and it is a derivative of the cannabis sativa genus plants (Marijuana and Hemp). It is one of the 113 identified substances found in Marijuana.  It accounts for up to 40% of the plant's extract. As of 2018, preliminary clinical studies on CBD included research for the treatment of anxiety, movement disorders, and pain. CBD is available and consumed in multiple forms, including inhalation of smoke or vapor, as an aerosol spray, and by mouth. It may be supplied as an oil containing CBD, capsules, dried cannabis, or as a liquid solution. CBD is thought not to be as psychoactive as THC (delta-9-tetrahydrocannabinol - the chemical in marijuana responsible for the "HIGH"). Studies suggest that CBD may interact with different biological target receptors in the body, including cannabinoid and other neurotransmitter receptors. As of 2018 the mechanism of action for its biological effects has not been determined.  Side-effects  Adverse reactions: Dry mouth, diarrhea, decreased appetite, fatigue, drowsiness, malaise, weakness, sleep disturbances, and others.  Drug interactions: CBC may interact with other medications such as blood-thinners. Because CBD causes drowsiness on its own, it also increases the drowsiness caused by other medications, including antihistamines (such as Benadryl), benzodiazepines (Xanax, Ativan, Valium), antipsychotics, antidepressants and opioids, as well as alcohol and supplements such as kava, melatonin and St. John's Wort. Be cautious with the following combinations:   Brivaracetam (Briviact) Brivaracetam is changed  and broken down by the body. CBD might decrease how quickly the body breaks down brivaracetam. This might increase levels of brivaracetam in the body.  Caffeine Caffeine is changed and broken down by the body. CBD might decrease how quickly the body breaks down caffeine. This might increase levels of caffeine in the body.  Carbamazepine (Tegretol) Carbamazepine is changed and broken down by the body. CBD might decrease how quickly the body breaks down carbamazepine. This might increase levels of carbamazepine in the body and increase its side effects.  Citalopram (Celexa) Citalopram is changed and broken down by the body. CBD might decrease how quickly the body breaks down citalopram. This might increase levels of citalopram in the body and increase its side effects.  Clobazam (Onfi) Clobazam is changed and broken down by the liver. CBD might decrease how quickly the liver breaks down clobazam. This might increase the effects and side effects of clobazam.  Eslicarbazepine (Aptiom) Eslicarbazepine is changed and broken down by the body. CBD might decrease how quickly the body breaks down eslicarbazepine. This might increase levels of eslicarbazepine in the body by a small amount.  Everolimus (Zostress) Everolimus is changed and broken down by the body. CBD might decrease how quickly the body breaks down everolimus. This might increase levels of everolimus in the body.  Lithium Taking higher doses of CBD might increase levels of lithium. This can increase the risk of lithium toxicity.  Medications changed by the liver (Cytochrome P450 1A1 (CYP1A1) substrates) Some medications are changed and broken down by the liver. CBD might change how quickly the liver breaks down these medications. This could change the effects and side effects of these medications.  Medications changed by the liver (Cytochrome P450 1A2 (CYP1A2) substrates) Some medications are changed and broken down by the liver. CBD  might change how quickly the liver breaks down these medications. This could change the effects and side effects of these medications.  Medications changed by the liver (Cytochrome P450 1B1 (CYP1B1) substrates) Some medications are changed and broken down by the liver. CBD might change how quickly the liver breaks down these medications. This could change the effects and side effects of these medications.  Medications changed by the liver (Cytochrome P450 2A6 (CYP2A6) substrates) Some medications are changed and  broken down by the liver. CBD might change how quickly the liver breaks down these medications. This could change the effects and side effects of these medications.  Medications changed by the liver (Cytochrome P450 2B6 (CYP2B6) substrates) Some medications are changed and broken down by the liver. CBD might change how quickly the liver breaks down these medications. This could change the effects and side effects of these medications.  Medications changed by the liver (Cytochrome P450 2C19 (CYP2C19) substrates) Some medications are changed and broken down by the liver. CBD might change how quickly the liver breaks down these medications. This could change the effects and side effects of these medications.  Medications changed by the liver (Cytochrome P450 2C8 (CYP2C8) substrates) Some medications are changed and broken down by the liver. CBD might change how quickly the liver breaks down these medications. This could change the effects and side effects of these medications.  Medications changed by the liver (Cytochrome P450 2C9 (CYP2C9) substrates) Some medications are changed and broken down by the liver. CBD might change how quickly the liver breaks down these medications. This could change the effects and side effects of these medications.  Medications changed by the liver (Cytochrome P450 2D6 (CYP2D6) substrates) Some medications are changed and broken down by the liver. CBD might  change how quickly the liver breaks down these medications. This could change the effects and side effects of these medications.  Medications changed by the liver (Cytochrome P450 2E1 (CYP2E1) substrates) Some medications are changed and broken down by the liver. CBD might change how quickly the liver breaks down these medications. This could change the effects and side effects of these medications.  Medications changed by the liver (Cytochrome P450 3A4 (CYP3A4) substrates) Some medications are changed and broken down by the liver. CBD might change how quickly the liver breaks down these medications. This could change the effects and side effects of these medications.  Medications changed by the liver (Glucuronidated drugs) Some medications are changed and broken down by the liver. CBD might change how quickly the liver breaks down these medications. This could change the effects and side effects of these medications.  Medications that decrease the breakdown of other medications by the liver (Cytochrome P450 2C19 (CYP2C19) inhibitors) CBD is changed and broken down by the liver. Some drugs decrease how quickly the liver changes and breaks down CBD. This could change the effects and side effects of CBD.  Medications that decrease the breakdown of other medications in the liver (Cytochrome P450 3A4 (CYP3A4) inhibitors) CBD is changed and broken down by the liver. Some drugs decrease how quickly the liver changes and breaks down CBD. This could change the effects and side effects of CBD.  Medications that increase breakdown of other medications by the liver (Cytochrome P450 3A4 (CYP3A4) inducers) CBD is changed and broken down by the liver. Some drugs increase how quickly the liver changes and breaks down CBD. This could change the effects and side effects of CBD.  Medications that increase the breakdown of other medications by the liver (Cytochrome P450 2C19 (CYP2C19) inducers) CBD is changed and  broken down by the liver. Some drugs increase how quickly the liver changes and breaks down CBD. This could change the effects and side effects of CBD.  Methadone (Dolophine) Methadone is broken down by the liver. CBD might decrease how quickly the liver breaks down methadone. Taking cannabidiol along with methadone might increase the effects and side effects of methadone.  Rufinamide (Banzel) Rufinamide is  changed and broken down by the body. CBD might decrease how quickly the body breaks down rufinamide. This might increase levels of rufinamide in the body by a small amount.  Sedative medications (CNS depressants) CBD might cause sleepiness and slowed breathing. Some medications, called sedatives, can also cause sleepiness and slowed breathing. Taking CBD with sedative medications might cause breathing problems and/or too much sleepiness.  Sirolimus (Rapamune) Sirolimus is changed and broken down by the body. CBD might decrease how quickly the body breaks down sirolimus. This might increase levels of sirolimus in the body.  Stiripentol (Diacomit) Stiripentol is changed and broken down by the body. CBD might decrease how quickly the body breaks down stiripentol. This might increase levels of stiripentol in the body and increase its side effects.  Tacrolimus (Prograf) Tacrolimus is changed and broken down by the body. CBD might decrease how quickly the body breaks down tacrolimus. This might increase levels of tacrolimus in the body.  Tamoxifen (Soltamox) Tamoxifen is changed and broken down by the body. CBD might affect how quickly the body breaks down tamoxifen. This might affect levels of tamoxifen in the body.  Topiramate (Topamax) Topiramate is changed and broken down by the body. CBD might decrease how quickly the body breaks down topiramate. This might increase levels of topiramate in the body by a small amount.  Valproate Valproic acid can cause liver injury. Taking cannabidiol  with valproic acid might increase the chance of liver injury. CBD and/or valproic acid might need to be stopped, or the dose might need to be reduced.  Warfarin (Coumadin) CBD might increase levels of warfarin, which can increase the risk for bleeding. CBD and/or warfarin might need to be stopped, or the dose might need to be reduced.  Zonisamide Zonisamide is changed and broken down by the body. CBD might decrease how quickly the body breaks down zonisamide. This might increase levels of zonisamide in the body by a small amount. (Last update: 08/17/2021) ____________________________________________________________________________________________  ____________________________________________________________________________________________  Drug Holidays (Slow)  What is a "Drug Holiday"? Drug Holiday: is the name given to the period of time during which a patient stops taking a medication(s) for the purpose of eliminating tolerance to the drug.  Benefits Improved effectiveness of opioids. Decreased opioid dose needed to achieve benefits. Improved pain with lesser dose.  What is tolerance? Tolerance: is the progressive decreased in effectiveness of a drug due to its repetitive use. With repetitive use, the body gets use to the medication and as a consequence, it loses its effectiveness. This is a common problem seen with opioid pain medications. As a result, a larger dose of the drug is needed to achieve the same effect that used to be obtained with a smaller dose.  How long should a "Drug Holiday" last? You should stay off of the pain medicine for at least 14 consecutive days. (2 weeks)  Should I stop the medicine "cold [place]"? No. You should always coordinate with your Pain Specialist so that he/she can provide you with the correct medication dose to make the transition as smoothly as possible.  How do I stop the medicine? Slowly. You will be instructed to decrease the daily amount of  pills that you take by one (1) pill every seven (7) days. This is called a "slow downward taper" of your dose. For example: if you normally take four (4) pills per day, you will be asked to drop this dose to three (3) pills per day for seven (7) days, then to  two (2) pills per day for seven (7) days, then to one (1) per day for seven (7) days, and at the end of those last seven (7) days, this is when the "Drug Holiday" would start.   Will I have withdrawals? By doing a "slow downward taper" like this one, it is unlikely that you will experience any significant withdrawal symptoms. Typically, what triggers withdrawals is the sudden stop of a high dose opioid therapy. Withdrawals can usually be avoided by slowly decreasing the dose over a prolonged period of time. If you do not follow these instructions and decide to stop your medication abruptly, withdrawals may be possible.  What are withdrawals? Withdrawals: refers to the wide range of symptoms that occur after stopping or dramatically reducing opiate drugs after heavy and prolonged use. Withdrawal symptoms do not occur to patients that use low dose opioids, or those who take the medication sporadically. Contrary to benzodiazepine (example: Valium, Xanax, etc.) or alcohol withdrawals ("Delirium Tremens"), opioid withdrawals are not lethal. Withdrawals are the physical manifestation of the body getting rid of the excess receptors.  Expected Symptoms Early symptoms of withdrawal may include: Agitation Anxiety Muscle aches Increased tearing Insomnia Runny nose Sweating Yawning  Late symptoms of withdrawal may include: Abdominal cramping Diarrhea Dilated pupils Goose bumps Nausea Vomiting  Will I experience withdrawals? Due to the slow nature of the taper, it is very unlikely that you will experience any.  What is a slow taper? Taper: refers to the gradual decrease in dose.  (Last update:  01/07/2020) ____________________________________________________________________________________________   Opioid Pain Medicine Management Opioids are powerful medicines that are used to treat moderate to severe pain. When used for short periods of time, they can help you to: Sleep better. Do better in physical or occupational therapy. Feel better in the first few days after an injury. Recover from surgery. Opioids should be taken with the supervision of a trained health care provider. They should be taken for the shortest period of time possible. This is because opioids can be addictive, and the longer you take opioids, the greater your risk of addiction. This addiction can also be called opioid use disorder. What are the risks? Using opioid pain medicines for longer than 3 days increases your risk of side effects. Side effects include: Constipation. Nausea and vomiting. Breathing difficulties (respiratory depression). Drowsiness. Confusion. Opioid use disorder. Itching. Taking opioid pain medicine for a long period of time can affect your ability to do daily tasks. It also puts you at risk for: Motor vehicle crashes. Depression. Suicide. Heart attack. Overdose, which can be life-threatening. What is a pain treatment plan? A pain treatment plan is an agreement between you and your health care provider. Pain is unique to each person, and treatments vary depending on your condition. To manage your pain, you and your health care provider need to work together. To help you do this: Discuss the goals of your treatment, including how much pain you might expect to have and how you will manage the pain. Review the risks and benefits of taking opioid medicines. Remember that a good treatment plan uses more than one approach and minimizes the chance of side effects. Be honest about the amount of medicines you take and about any drug or alcohol use. Get pain medicine prescriptions from only one  health care provider. Pain can be managed with many types of alternative treatments. Ask your health care provider to refer you to one or more specialists who can help you manage pain  through: Physical or occupational therapy. Counseling (cognitive behavioral therapy). Good nutrition. Biofeedback. Massage. Meditation. Non-opioid medicine. Following a gentle exercise program. How to use opioid pain medicine Taking medicine Take your pain medicine exactly as told by your health care provider. Take it only when you need it. If your pain gets less severe, you may take less than your prescribed dose if your health care provider approves. If you are not having pain, do nottake pain medicine unless your health care provider tells you to take it. If your pain is severe, do nottry to treat it yourself by taking more pills than instructed on your prescription. Contact your health care provider for help. Write down the times when you take your pain medicine. It is easy to become confused while on pain medicine. Writing the time can help you avoid overdose. Take other over-the-counter or prescription medicines only as told by your health care provider. Keeping yourself and others safe  While you are taking opioid pain medicine: Do not drive, use machinery, or power tools. Do not sign legal documents. Do not drink alcohol. Do not take sleeping pills. Do not supervise children by yourself. Do not do activities that require climbing or being in high places. Do not go to a lake, river, ocean, spa, or swimming pool. Do not share your pain medicine with anyone. Keep pain medicine in a locked cabinet or in a secure area where pets and children cannot reach it. Stopping your use of opioids If you have been taking opioid medicine for more than a few weeks, you may need to slowly decrease (taper) how much you take until you stop completely. Tapering your use of opioids can decrease your risk of symptoms of  withdrawal, such as: Pain and cramping in the abdomen. Nausea. Sweating. Sleepiness. Restlessness. Uncontrollable shaking (tremors). Cravings for the medicine. Do not attempt to taper your use of opioids on your own. Talk with your health care provider about how to do this. Your health care provider may prescribe a step-down schedule based on how much medicine you are taking and how long you have been taking it. Getting rid of leftover pills Do not save any leftover pills. Get rid of leftover pills safely by: Taking the medicine to a prescription take-back program. This is usually offered by the county or law enforcement. Bringing them to a pharmacy that has a drug disposal container. Flushing them down the toilet. Check the label or package insert of your medicine to see whether this is safe to do. Throwing them out in the trash. Check the label or package insert of your medicine to see whether this is safe to do. If it is safe to throw it out, remove the medicine from the original container, put it into a sealable bag or container, and mix it with used coffee grounds, food scraps, dirt, or cat litter before putting it in the trash. Follow these instructions at home: Activity Do exercises as told by your health care provider. Avoid activities that make your pain worse. Return to your normal activities as told by your health care provider. Ask your health care provider what activities are safe for you. General instructions You may need to take these actions to prevent or treat constipation: Drink enough fluid to keep your urine pale yellow. Take over-the-counter or prescription medicines. Eat foods that are high in fiber, such as beans, whole grains, and fresh fruits and vegetables. Limit foods that are high in fat and processed sugars, such as fried  or sweet foods. Keep all follow-up visits. This is important. Where to find support If you have been taking opioids for a long time, you  may benefit from receiving support for quitting from a local support group or counselor. Ask your health care provider for a referral to these resources in your area. Where to find more information Centers for Disease Control and Prevention (CDC): http://www.wolf.info/ [place] Food and Drug Administration (FDA): [place] Get help right away if: You may have taken too much of an opioid (overdosed). Common symptoms of an overdose: Your breathing is slower or more shallow than normal. You have a very slow heartbeat (pulse). You have slurred speech. You have nausea and vomiting. Your pupils become very small. You have other potential symptoms: You are very confused. You faint or feel like you will faint. You have cold, clammy skin. You have blue lips or fingernails. You have thoughts of harming yourself or harming others. These symptoms may represent a serious problem that is an emergency. Do not wait to see if the symptoms will go away. Get medical help right away. Call your local emergency services (911 in the [place] Do not drive yourself to the hospital.  If you ever feel like you may hurt yourself or others, or have thoughts about taking your own life, get help right away. Go to your nearest emergency department or: Call your local emergency services (911 in the [place] Call the [facility] ([phone])[phone] in the [place] Call a suicide crisis helpline, such as the [facility] at (218) 422-1559 or 988 in the [place]. This is open 24 hours a day in the [place] Text the Crisis Text Line at 262 831 7237 (in the [place].). Summary Opioid medicines can help you manage moderate to severe pain for a short period of time. A pain treatment plan is an agreement between you and your health care provider. Discuss the goals of your treatment, including how much pain you might expect to have and how you will manage the pain. If you think that you or someone else may have taken too much of  an opioid, get medical help right away. This information is not intended to replace advice given to you by your health care provider. Make sure you discuss any questions you have with your health care provider. Document Revised: 12/29/2020 Document Reviewed: 09/15/2020 Elsevier Patient Education  [place]. Opioid Overdose Opioids are drugs that are often used to treat pain. Opioids include illegal drugs, such as heroin, as well as prescription pain medicines, such as codeine, morphine, hydrocodone, and fentanyl. An opioid overdose happens when you take too much of an opioid. An overdose may be intentional or accidental and can happen with any type of opioid. The effects of an overdose can be mild, dangerous, or even deadly. Opioid overdose is a medical emergency. What are the causes? This condition may be caused by: Taking too much of an opioid on purpose. Taking too much of an opioid by accident. Using two or more substances that contain opioids at the same time. Taking an opioid with a substance that affects your heart, breathing, or blood pressure. These include alcohol, tranquilizers, sleeping pills, illegal drugs, and some over-the-counter medicines. This condition may also happen due to an error made by: A health care provider who prescribes a medicine. The pharmacist who fills the prescription. What increases the risk? This condition is more likely in: Children. They may be attracted to colorful pills. Because of a child's small size,  even a small amount of a medicine can be dangerous. Older people. They may be taking many different medicines. Older people may have difficulty reading labels or remembering when they last took their medicines. They may also be more sensitive to the effects of opioids. People with chronic medical conditions, especially heart, liver, kidney, or neurological diseases. People who take an opioid for a long period of time. People who take opioids and  use illegal drugs, such as heroin, or other substances, such as alcohol. People who: Have a history of drug or alcohol abuse. Have certain mental health conditions. Have a history of previous drug overdoses. People who take opioids that are not prescribed for them. What are the signs or symptoms? Symptoms of this condition depend on the type of opioid and the amount that was taken. Common symptoms include: Sleepiness or difficulty waking from sleep. Confusion. Slurred speech. Slowed breathing and a slow pulse (bradycardia). Nausea and vomiting. Abnormally small pupils. Signs and symptoms that require emergency treatment include: Cold, clammy, and pale skin. Blue lips and fingernails. Vomiting. Gurgling sounds in the throat. A pulse that is very slow or difficult to detect. Breathing that is very irregular, slow, noisy, or difficult to detect. Inability to respond to speech or be awakened from sleep (stupor). Seizures. How is this diagnosed? This condition is diagnosed based on your symptoms and medical history. It is important to tell your health care provider: About all of the opioids that you took. When you took the opioids. Whether you were drinking alcohol or using marijuana, cocaine, or other drugs. Your health care provider will do a physical exam. This exam may include: Checking and monitoring your heart rate and rhythm, breathing rate, temperature, and blood pressure. Measuring oxygen levels in your blood. Checking for abnormally small pupils. You may also have blood tests or urine tests. You may have X-rays if you are having severe breathing problems. How is this treated? This condition requires immediate medical treatment and hospitalization. Reversing the effects of the opioid is the first step in treatment. If you have a Narcan kit or naloxone, use it right away. Follow your health care provider's instructions. A friend or family member can also help you with this. The  rest of your treatment will be given in the hospital intensive care (ICU). Treatment in the hospital may include: Giving salts and minerals (electrolytes) along with fluids through an IV. Inserting a breathing tube (endotracheal tube) in your airway to help you breathe if you cannot breathe on your own or you are in danger of not being able to breathe on your own. Giving oxygen through a small tube under your nose. Passing a tube through your nose and into your stomach (nasogastric tube, or NG tube) to empty your stomach. Giving medicines that: Increase your blood pressure. Relieve nausea and vomiting. Relieve abdominal pain and cramping. Reverse the effects of the opioid (naloxone). Monitoring your heart and oxygen levels. Ongoing counseling and mental health support if you intentionally overdosed or used an illegal drug. Follow these instructions at home:  Medicines Take over-the-counter and prescription medicines only as told by your health care provider. Always ask your health care provider about possible side effects and interactions of any new medicine that you start taking. Keep a list of all the medicines that you take, including over-the-counter medicines. Bring this list with you to all your medical visits. General instructions Drink enough fluid to keep your urine pale yellow. Keep all follow-up visits. This is important.  How is this prevented? Read the drug inserts that come with your opioid pain medicines. Take medicines only as told by your health care provider. Do not take more medicine than you are told. Do not take medicines more frequently than you are told. Do not drink alcohol or take sedatives when taking opioids. Do not use illegal or recreational drugs, including cocaine, ecstasy, and marijuana. Do not take opioid medicines that are not prescribed for you. Store all medicines in safety containers that are out of the reach of children. Get help if you are struggling  with: Alcohol or drug use. Depression or another mental health problem. Thoughts of hurting yourself or another person. Keep the phone number of your local poison control center near your phone or in your mobile phone. In the [place] the hotline of the [facility] is 4690324214. If you were prescribed naloxone, make sure you understand how to take it. Contact a health care provider if: You need help understanding how to take your pain medicines. You feel your medicines are too strong. You are concerned that your pain medicines are not working well for your pain. You develop new symptoms or side effects when you are taking medicines. Get help right away if: You or someone else is having symptoms of an opioid overdose. Get help even if you are not sure. You have thoughts about hurting yourself or others. You have: Chest pain. Difficulty breathing. A loss of consciousness. These symptoms may represent a serious problem that is an emergency. Do not wait to see if the symptoms will go away. Get medical help right away. Call your local emergency services (911 in the [place] Do not drive yourself to the hospital. If you ever feel like you may hurt yourself or others, or have thoughts about taking your own life, get help right away. You can go to your nearest emergency department or: Call your local emergency services (911 in the [place] Call a suicide crisis helpline, such as the [place] at 563-799-9964 or 988 in the [place]. This is open 24 hours a day in the [place] Text the Crisis Text Line at 302-551-4770 (in the [place].). Summary Opioids are drugs that are often used to treat pain. Opioids include illegal drugs, such as heroin, as well as prescription pain medicines. An opioid overdose happens when you take too much of an opioid. Overdoses can be intentional or accidental. Opioid overdose is very dangerous. It is a life-threatening emergency. If you or  someone you know is experiencing an opioid overdose, get help right away. This information is not intended to replace advice given to you by your health care provider. Make sure you discuss any questions you have with your health care provider. Document Revised: 12/29/2020 Document Reviewed: 09/15/2020 Elsevier Patient Education  [place].

## 2022-02-28 ENCOUNTER — Other Ambulatory Visit: Payer: Self-pay | Admitting: Family Medicine

## 2022-02-28 DIAGNOSIS — M170 Bilateral primary osteoarthritis of knee: Secondary | ICD-10-CM

## 2022-02-28 DIAGNOSIS — R29898 Other symptoms and signs involving the musculoskeletal system: Secondary | ICD-10-CM

## 2022-02-28 DIAGNOSIS — R296 Repeated falls: Secondary | ICD-10-CM

## 2022-02-28 DIAGNOSIS — R2681 Unsteadiness on feet: Secondary | ICD-10-CM

## 2022-02-28 DIAGNOSIS — H547 Unspecified visual loss: Secondary | ICD-10-CM

## 2022-03-04 LAB — DRUG SCREEN 10 W/CONF, SERUM
Amphetamines, IA: NEGATIVE ng/mL
Barbiturates, IA: NEGATIVE ug/mL
Benzodiazepines, IA: NEGATIVE ng/mL
Cocaine & Metabolite, IA: NEGATIVE ng/mL
Methadone, IA: NEGATIVE ng/mL
Opiates, IA: NEGATIVE ng/mL
Oxycodones, IA: NEGATIVE ng/mL
Phencyclidine, IA: NEGATIVE ng/mL
Propoxyphene, IA: NEGATIVE ng/mL
THC(Marijuana) Metabolite, IA: NEGATIVE ng/mL

## 2022-03-06 ENCOUNTER — Telehealth: Payer: Self-pay

## 2022-03-06 NOTE — Telephone Encounter (Signed)
Okay to proceed  [name], DO [facility] 03/06/2022, 11:22 AM

## 2022-03-06 NOTE — Telephone Encounter (Signed)
Mrs. Aaron Gibbs has been notified of orders to proceed.

## 2022-03-06 NOTE — Telephone Encounter (Signed)
Copied from [place] [phone]. Topic: General - Other >> Mar 02, 2022  1:42 PM Tiffany B wrote: Mrs Aaron Gibbs, physical therapist requesting verbal orders for PT home health for  1x 6 and social worker orders to assist patient and care giver with community resources. Please call back at  [phone]

## 2022-03-07 ENCOUNTER — Ambulatory Visit
Admission: RE | Admit: 2022-03-07 | Discharge: 2022-03-07 | Disposition: A | Discharge status: Home / Self Care | Payer: Medicare Other | Source: Ambulatory Visit | Attending: Pain Medicine | Admitting: Pain Medicine

## 2022-03-07 DIAGNOSIS — M25562 Pain in left knee: Secondary | ICD-10-CM | POA: Diagnosis present

## 2022-03-07 DIAGNOSIS — S83282S Other tear of lateral meniscus, current injury, left knee, sequela: Secondary | ICD-10-CM

## 2022-03-07 DIAGNOSIS — M4810 Ankylosing hyperostosis [Forestier], site unspecified: Secondary | ICD-10-CM

## 2022-03-07 DIAGNOSIS — S83242S Other tear of medial meniscus, current injury, left knee, sequela: Secondary | ICD-10-CM

## 2022-03-07 DIAGNOSIS — G8929 Other chronic pain: Secondary | ICD-10-CM

## 2022-03-07 DIAGNOSIS — R936 Abnormal findings on diagnostic imaging of limbs: Secondary | ICD-10-CM | POA: Diagnosis present

## 2022-03-07 DIAGNOSIS — M1712 Unilateral primary osteoarthritis, left knee: Secondary | ICD-10-CM

## 2022-03-07 DIAGNOSIS — M170 Bilateral primary osteoarthritis of knee: Secondary | ICD-10-CM | POA: Diagnosis present

## 2022-03-07 DIAGNOSIS — M25561 Pain in right knee: Secondary | ICD-10-CM | POA: Insufficient documentation

## 2022-03-08 ENCOUNTER — Telehealth: Payer: Self-pay

## 2022-03-08 ENCOUNTER — Other Ambulatory Visit: Payer: Self-pay | Admitting: Family Medicine

## 2022-03-08 DIAGNOSIS — F332 Major depressive disorder, recurrent severe without psychotic features: Secondary | ICD-10-CM

## 2022-03-08 MED ORDER — TRAZODONE HCL 150 MG PO TABS: 150.0000 mg | ORAL_TABLET | Freq: Every evening | ORAL | 1 refills | Status: DC | PRN

## 2022-03-08 NOTE — Telephone Encounter (Signed)
430 pm.  Incoming call from caregiver Charlie.  Visit scheduled for Monday at 11 am.

## 2022-03-08 NOTE — Telephone Encounter (Signed)
Requested medication (s) are due for refill today - yes  Requested medication (s) are on the active medication list -yes  Future visit scheduled -no  Last refill: 12/06/21  Notes to clinic: medication listed as historical- sent for review of request  Requested Prescriptions  Pending Prescriptions Disp Refills   traZODone (DESYREL) 150 MG tablet      Sig: Take 1 tablet (150 mg total) by mouth at bedtime as needed.     Psychiatry: Antidepressants - Serotonin Modulator Passed - 03/08/2022  1:13 PM      Passed - Completed PHQ-2 or PHQ-9 in the last 360 days      Passed - Valid encounter within last 6 months    Recent Outpatient Visits           1 month ago Essential hypertension   [place], DO   1 year ago Severe episode of recurrent major depressive disorder, with psychotic features ([place])   [place], DO   1 year ago Seasonal allergic rhinitis due to other allergic trigger   [facility] Olin Hauser, DO   1 year ago COPD with acute exacerbation [facility])   [facility] Olin Hauser, DO   1 year ago Upper respiratory tract infection, unspecified type   [facility] Jon Billings, NP       Future Appointments             In 3 weeks Gusler, Christin Z, NP [facility]               Requested Prescriptions  Pending Prescriptions Disp Refills   traZODone (DESYREL) 150 MG tablet      Sig: Take 1 tablet (150 mg total) by mouth at bedtime as needed.     Psychiatry: Antidepressants - Serotonin Modulator Passed - 03/08/2022  1:13 PM      Passed - Completed PHQ-2 or PHQ-9 in the last 360 days      Passed - Valid encounter within last 6 months    Recent Outpatient Visits           1 month ago Essential hypertension   [place], DO   1 year ago Severe episode of  recurrent major depressive disorder, with psychotic features [facility])   [place], DO   1 year ago Seasonal allergic rhinitis due to other allergic trigger   [facility] Olin Hauser, DO   1 year ago COPD with acute exacerbation [facility])   [facility] Olin Hauser, DO   1 year ago Upper respiratory tract infection, unspecified type   [facility] Jon Billings, NP       Future Appointments             In 3 weeks Gusler, Christin Z, NP [facility]

## 2022-03-08 NOTE — Telephone Encounter (Signed)
Copied from [place] [phone]. Topic: General - Inquiry >> Mar 08, 2022 11:51 AM Erskine Squibb wrote: Reason for CRM: Aaron Gibbs with [facility] called in to let the provider know that the patient has declined an evaluation for OT at this time. Please assist further if necessary.  fyi

## 2022-03-08 NOTE — Telephone Encounter (Signed)
Medication Refill - Medication: traZODone (DESYREL) 150 MG tablet  Has the patient contacted their pharmacy? No.  (Agent: If yes, when and what did the pharmacy advise?) Patient preferred to call PCP  Preferred Pharmacy (with phone number or street name):  [place], [place] Phone:  [phone]  Fax:  [phone]      Has the patient been seen for an appointment in the last year OR does the patient have an upcoming appointment? Yes.    Agent: Please be advised that RX refills may take up to 3 business days. We ask that you follow-up with your pharmacy.

## 2022-03-08 NOTE — Telephone Encounter (Signed)
2 pm.  Phone call made to daughter [name] to schedule a home visit with patient.  [name] is agreeable but has requested that I contact patient's caregiver.  Phone call made to [facility] and she has requested a call back later as she is not currently home and does not have access to the calendar.  Return call will be made at a later date.

## 2022-03-13 ENCOUNTER — Other Ambulatory Visit: Payer: Medicare Other

## 2022-03-13 ENCOUNTER — Other Ambulatory Visit: Payer: Self-pay | Admitting: Family Medicine

## 2022-03-13 ENCOUNTER — Telehealth: Payer: Self-pay | Admitting: *Deleted

## 2022-03-13 VITALS — BP 180/80 | HR 63 | Temp 97.7°F

## 2022-03-13 DIAGNOSIS — J432 Centrilobular emphysema: Secondary | ICD-10-CM

## 2022-03-13 DIAGNOSIS — Z515 Encounter for palliative care: Secondary | ICD-10-CM

## 2022-03-13 DIAGNOSIS — I10 Essential (primary) hypertension: Secondary | ICD-10-CM

## 2022-03-13 MED ORDER — BREZTRI AEROSPHERE 160-9-4.8 MCG/ACT IN AERO: 2.0000 | INHALATION_SPRAY | Freq: Two times a day (BID) | RESPIRATORY_TRACT | 11 refills | Status: DC

## 2022-03-13 MED ORDER — ALBUTEROL SULFATE HFA 108 (90 BASE) MCG/ACT IN AERS: 2.0000 | INHALATION_SPRAY | RESPIRATORY_TRACT | 3 refills | Status: DC | PRN

## 2022-03-13 NOTE — Telephone Encounter (Signed)
Called Almyra Free back  She will follow up with caregiver onhis BP medications, if he has both Losartan and Carvedilol and if taking both regularly. They will get new BP cuff at home and report readings.  Higher BP since August 2023 since returned to [facility]. Back in 01/2021 he had lower BP 100/50s often.  Likely increased pain now.  Also some inc fluid, question if CHF component.  Will agree to order new rx Albuterol and Breztri inhaler.  Will await future updates from [place], DO [facility] 03/13/2022, 5:21 PM

## 2022-03-13 NOTE — Addendum Note (Signed)
Addended by: Olin Hauser on: 03/13/2022 05:21 PM   Modules accepted: Orders

## 2022-03-13 NOTE — Telephone Encounter (Signed)
Medication Refill - Medication: carvedilol (COREG) 12.5 MG tablet  Has the patient contacted their pharmacy? Yes.   Pt told to contact provider   Preferred Pharmacy (with phone number or street name):  [place], [place] Phone:  [phone]  Fax:  [phone]     Has the patient been seen for an appointment in the last year OR does the patient have an upcoming appointment? Yes.    Agent: Please be advised that RX refills may take up to 3 business days. We ask that you follow-up with your pharmacy.

## 2022-03-13 NOTE — Progress Notes (Signed)
PATIENT NAME: Aaron Gibbs DOB: 02-15-1935 MRN: [id number]  PRIMARY CARE PROVIDER: Olin Hauser, DO  RESPONSIBLE PARTY:  Acct ID - Guarantor Home Phone Work Phone Relationship Acct Type  [id number] ISAIS, [id number] [phone]  Self P/F     [address], [place], [place] [address]    Home visit completed with patient and caregiver Charlie.  Appetite:  Decreased po intake per patient and caregiver.  Patient endorses eating ice cream for breakfast.  He may have a sandwich for lunch or a snack and then eats dinner.  States his weight has ranged between 220-223 lbs.  Only weighs at [place] appointments.  Discussed supplement drinks and patient endorses previously taking equate +.    Elevated blood pressure:  Pressure elevated today.  Patient endorses taking Lorsartan 50 mg at 8 am today.  No longer on amlodipine. Patient does not check blood pressures at home.  Caregiver will try and obtain one to monitor blood pressures more closer.   Pain:  Chronic pain to bilateral knees.  Patient had MRI of bilateral knees last week.  He is scheduled to see pain management next month to review results and determine a treatment plan.  Shortness of breath:  Patient endorses shortness of breath for almost a week when laying down.  He is considering an urgent care/ED visit if symptoms continue.  Patient endorses CHF diagnosis when living in [place] and having a thoracentesis ?? completed.  Currently has Breztri sample inhaler that was given to him in [place] and has been using this periodically.  Does not have albuterol inhaler in the home but listed on medication profile.  [place] and they do not have this as a listed medication.  Phone call made to PCP office to provide an update on blood pressure and above.  Spoke with Opal Sidles, RN who will follow up with provider.     CODE STATUS: DNR ADVANCED DIRECTIVES: No MOST FORM: No PPS: 40%   PHYSICAL EXAM:   VITALS: Today's Vitals    03/13/22 1056  BP: (!) 180/80  Pulse: 63  Temp: 97.7 F (36.5 C)  SpO2: 95%  PainSc: 7   PainLoc: Knee    LUNGS: decreased breath sounds CARDIAC: Cor RRR}  EXTREMITIES: trace bilateral lower extremity edema SKIN: Skin color, texture, turgor normal. No rashes or lesions or mobility and turgor normal  NEURO: positive for gait problems       Lorenza Burton, RN

## 2022-03-13 NOTE — Telephone Encounter (Signed)
Almyra Free, RN Pallative Care: Contact number: [phone]  Calling to see if an inhaler would be appropriate for patient   BP 180/80- no missed medications and will check more frequently to see if that number is staying elevated  Patient having SOB- when reclined- using pillows to elevate head. Patient has inhaler Breztri- sample inhaler from previous provider and using as he feels he needs- not daily. Albuterol inhaler on list and wants to know if it would appropriate to prescribe for patient. Diagnosis not clear regarding COPD- possible hx thoracocentesis in the past. Os sat 95% sitting at bedside P 63  Almyra Free reports patient is now living with care giver Reina Fuse and is no longer at home on  [address]

## 2022-03-13 NOTE — Telephone Encounter (Signed)
Aaron Gibbs reports: Lungs are clear, trace edema in lower extremities

## 2022-03-14 MED ORDER — CARVEDILOL 12.5 MG PO TABS: 12.5000 mg | ORAL_TABLET | Freq: Two times a day (BID) | ORAL | 1 refills | Status: DC

## 2022-03-14 NOTE — Telephone Encounter (Signed)
Requested medication (s) are due for refill today: yes  Requested medication (s) are on the active medication list: no  Last refill:  11/26/21  Future visit scheduled: no  Notes to clinic:  Unable to refill per protocol, last refill by another provider.  Historical provider, routing for review.     Requested Prescriptions  Pending Prescriptions Disp Refills   carvedilol (COREG) 12.5 MG tablet      Sig: Take 1 tablet (12.5 mg total) by mouth 2 (two) times daily.     Cardiovascular: Beta Blockers 3 Failed - 03/13/2022 10:23 AM      Failed - Last BP in normal range    BP Readings from Last 1 Encounters:  03/13/22 (!) 180/80         Passed - Cr in normal range and within 360 days    Creatinine, Ser  Date Value Ref Range Status  01/30/2022 1.20 0.61 - 1.24 mg/dL Final   Creatinine, Urine  Date Value Ref Range Status  12/13/2019 631 mg/dL Final    Comment:    RESULTS CONFIRMED BY MANUAL DILUTION Performed at [facility], [address] [place], [place] [address]          Passed - AST in normal range and within 360 days    AST  Date Value Ref Range Status  01/29/2022 25 15 - 41 U/L Final         Passed - ALT in normal range and within 360 days    ALT  Date Value Ref Range Status  01/29/2022 12 0 - 44 U/L Final         Passed - Last Heart Rate in normal range    Pulse Readings from Last 1 Encounters:  03/13/22 63         Passed - Valid encounter within last 6 months    Recent Outpatient Visits           1 month ago Essential hypertension   [place], DO   1 year ago Severe episode of recurrent major depressive disorder, with psychotic features ([place])   [place], DO   1 year ago Seasonal allergic rhinitis due to other allergic trigger   [facility] Olin Hauser, DO   1 year ago COPD with acute exacerbation [facility])   [facility] Olin Hauser, DO   1 year ago Upper respiratory tract infection, unspecified type   [facility] Jon Billings, NP       Future Appointments             In 3 weeks Gusler, Christin Z, NP [facility]

## 2022-03-15 ENCOUNTER — Emergency Department: Payer: Medicare Other

## 2022-03-15 ENCOUNTER — Emergency Department
Admission: EM | Admit: 2022-03-15 | Discharge: 2022-03-15 | Disposition: A | Discharge status: Home / Self Care | Payer: Medicare Other | Attending: Emergency Medicine | Admitting: Emergency Medicine

## 2022-03-15 ENCOUNTER — Other Ambulatory Visit: Payer: Self-pay

## 2022-03-15 ENCOUNTER — Encounter: Payer: Self-pay | Admitting: Emergency Medicine

## 2022-03-15 DIAGNOSIS — R0602 Shortness of breath: Secondary | ICD-10-CM | POA: Diagnosis present

## 2022-03-15 DIAGNOSIS — I10 Essential (primary) hypertension: Secondary | ICD-10-CM | POA: Diagnosis not present

## 2022-03-15 DIAGNOSIS — J441 Chronic obstructive pulmonary disease with (acute) exacerbation: Secondary | ICD-10-CM | POA: Diagnosis not present

## 2022-03-15 LAB — COMPREHENSIVE METABOLIC PANEL
ALT: 11 U/L (ref 0–44)
AST: 22 U/L (ref 15–41)
Albumin: 3.7 g/dL (ref 3.5–5.0)
Alkaline Phosphatase: 64 U/L (ref 38–126)
Anion gap: 8 (ref 5–15)
BUN: 17 mg/dL (ref 8–23)
CO2: 24 mmol/L (ref 22–32)
Calcium: 8.8 mg/dL — ABNORMAL LOW (ref 8.9–10.3)
Chloride: 111 mmol/L (ref 98–111)
Creatinine, Ser: 1.15 mg/dL (ref 0.61–1.24)
GFR, Estimated: >60 mL/min (ref >60)
Glucose, Bld: 127 mg/dL — ABNORMAL HIGH (ref 70–99)
Potassium: 4.1 mmol/L (ref 3.5–5.1)
Sodium: 143 mmol/L (ref 135–145)
Total Bilirubin: 0.6 mg/dL (ref 0.3–1.2)
Total Protein: 6.8 g/dL (ref 6.5–8.1)

## 2022-03-15 LAB — CBC
HCT: 34.4 % — ABNORMAL LOW (ref 39.0–52.0)
Hemoglobin: 10.1 g/dL — ABNORMAL LOW (ref 13.0–17.0)
MCH: 24 pg — ABNORMAL LOW (ref 26.0–34.0)
MCHC: 29.4 g/dL — ABNORMAL LOW (ref 30.0–36.0)
MCV: 81.9 fL (ref 80.0–100.0)
Platelets: 118 10*3/uL — ABNORMAL LOW (ref 150–400)
RBC: 4.2 MIL/uL — ABNORMAL LOW (ref 4.22–5.81)
RDW: 17.2 % — ABNORMAL HIGH (ref 11.5–15.5)
WBC: 3.3 10*3/uL — ABNORMAL LOW (ref 4.0–10.5)
nRBC: 0 % (ref 0.0–0.2)

## 2022-03-15 LAB — TROPONIN I (HIGH SENSITIVITY): Troponin I (High Sensitivity): 8 ng/L (ref <18)

## 2022-03-15 MED ORDER — LORAZEPAM 2 MG/ML IJ SOLN
0.5000 mg | Freq: Once | INTRAMUSCULAR | Status: AC
  Administered 2022-03-15: 0.5 mg via INTRAMUSCULAR

## 2022-03-15 MED ORDER — PREDNISONE 20 MG PO TABS
60.0000 mg | ORAL_TABLET | Freq: Once | ORAL | Status: AC
  Administered 2022-03-15: 60 mg via ORAL
  Filled 2022-03-15: qty 3

## 2022-03-15 MED ORDER — IPRATROPIUM-ALBUTEROL 0.5-2.5 (3) MG/3ML IN SOLN
3.0000 mL | Freq: Once | RESPIRATORY_TRACT | Status: AC
  Administered 2022-03-15: 3 mL via RESPIRATORY_TRACT
  Filled 2022-03-15: qty 3

## 2022-03-15 MED ORDER — AEROCHAMBER MV MISC: 0 refills | Status: DC

## 2022-03-15 MED ORDER — IPRATROPIUM-ALBUTEROL 20-100 MCG/ACT IN AERS: 1.0000 | INHALATION_SPRAY | Freq: Four times a day (QID) | RESPIRATORY_TRACT | 1 refills | Status: DC

## 2022-03-15 MED ORDER — ALBUTEROL SULFATE HFA 108 (90 BASE) MCG/ACT IN AERS: 2.0000 | INHALATION_SPRAY | Freq: Four times a day (QID) | RESPIRATORY_TRACT | 2 refills | Status: DC | PRN

## 2022-03-15 MED ORDER — PREDNISONE 10 MG (21) PO TBPK: ORAL_TABLET | ORAL | 0 refills | Status: DC

## 2022-03-15 MED ORDER — LORAZEPAM 2 MG/ML IJ SOLN
0.5000 mg | Freq: Once | INTRAMUSCULAR | Status: DC
  Filled 2022-03-15: qty 1

## 2022-03-15 NOTE — ED Triage Notes (Signed)
Patient to ED via [facility] form home for SOB. Patient currently on 2L [place] for comfort- pt initially 95% on RA. Patient looks labored in breathing. Patient c/o overall weakness and pain all over body.

## 2022-03-15 NOTE — ED Notes (Signed)
Pt away at imaging.

## 2022-03-15 NOTE — ED Notes (Signed)
Pt denies fever though reports chills, sweats, dry cough, difficulty breathing; denies N/V/D; denies asthma and COPD; non-pitting edema noted bilaterally in lower legs; denies being on O2 via [place] at home. Pt's resp reg/unlabored though RR inc at 23 bpm; pt currently on 2L via [place] at 98%; pt laying on R side on stretcher calmly.

## 2022-03-15 NOTE — ED Notes (Signed)
Pt extremely anxious and keeps trying to climb to end of bed though attached to monitor. Pt educated multiples times of fall risk and that O2 cord cannot reach end of bed; pt continues to states anxiety and that he needs to sit on edge of bed. Pt blind and not used to surroundings. Pt oriented to space and assisted multiple times to reposition on stretcher.

## 2022-03-15 NOTE — ED Triage Notes (Signed)
First nurse note: Arrived by PTAR from home. Patient is legally blind. C/o difficulty breathing X1 week and in tripod position upon EMS arrival. EMS reports bilateral crackles in lungs.   HX CHF  EMS vitals: 95% RA - EMS reports placing on 2L for comfort per patient request 196/100 b/p 63HR 97.3oral 143 CBG  Charlie (patients friend) [phone]

## 2022-03-15 NOTE — ED Notes (Signed)
Charlie at [phone] called per pt's request as will be pt's ride home. Pt currently finishing his breathing tx. Pt continues to yell out for help at times rather than using call bell he has been educated about multiples times and that remains attached to bedrail within reach.

## 2022-03-15 NOTE — ED Provider Notes (Signed)
[facility] Provider Note   Event Date/Time   First MD Initiated Contact with Patient 03/15/22 1207     (approximate) History  Shortness of Breath  HPI Zae Kirtz is a 87 y.o. male with multiple past medical problems including COPD, chronic respiratory failure, PVD, morbid obesity, hyperlipidemia, hypertension, AVM of the colon, anxiety/depression/suicidality who presents for shortness of breath.  Patient states that he has been short of breath over the past 5 months and is concerned that he may need oxygen at home.  Patient states that he understands he has a diagnosis of COPD but does not remember ever being diagnosed with this disease and denies having any albuterol inhalers or any other inhalers for breathing at home. ROS: Patient currently denies any vision changes, tinnitus, difficulty speaking, facial droop, sore throat, chest pain, abdominal pain, nausea/vomiting/diarrhea, dysuria, or weakness/numbness/paresthesias in any extremity   Physical Exam  Triage Vital Signs: ED Triage Vitals  Enc Vitals Group     BP 03/15/22 1118 (!) 206/98     Pulse Rate 03/15/22 1118 67     Resp 03/15/22 1118 (!) 24     Temp 03/15/22 1118 98.1 F (36.7 C)     Temp Source 03/15/22 1118 Oral     SpO2 03/15/22 1118 95 %     Weight 03/15/22 1122 225 lb (102.1 kg)     Height 03/15/22 1122 '5\' 10"'$  (1.778 m)     Head Circumference --      Peak Flow --      Pain Score 03/15/22 1121 7     Pain Loc --      Pain Edu? --      Excl. in [place]? --    Most recent vital signs: Vitals:   03/15/22 1118  BP: (!) 206/98  Pulse: 67  Resp: (!) 24  Temp: 98.1 F (36.7 C)  SpO2: 95%   General: Awake, oriented x4. CV:  Good peripheral perfusion.  Resp:  Normal effort.  Expiratory wheezes over bilateral lung fields Abd:  No distention.  Other:  Elderly obese Caucasian male laying in bed in no acute distress.  Sleeping upon my arrival and easily arousable to voice ED Results /  Procedures / Treatments  Labs (all labs ordered are listed, but only abnormal results are displayed) Labs Reviewed  CBC - Abnormal; Notable for the following components:      Result Value   WBC 3.3 (*)    RBC 4.20 (*)    Hemoglobin 10.1 (*)    HCT 34.4 (*)    MCH 24.0 (*)    MCHC 29.4 (*)    RDW 17.2 (*)    Platelets 118 (*)    All other components within normal limits  COMPREHENSIVE METABOLIC PANEL - Abnormal; Notable for the following components:   Glucose, Bld 127 (*)    Calcium 8.8 (*)    All other components within normal limits  TROPONIN I (HIGH SENSITIVITY)  TROPONIN I (HIGH SENSITIVITY)   EKG ED ECG REPORT I, Naaman Plummer, the attending physician, personally viewed and interpreted this ECG. Date: 03/15/2022 EKG Time: 1125 Rate: 65 Rhythm: normal sinus rhythm QRS Axis: normal Intervals: normal ST/T Wave abnormalities: normal Narrative Interpretation: no evidence of acute ischemia RADIOLOGY ED MD interpretation: 2 view chest x-ray interpreted by me shows no evidence of acute abnormalities including no pneumonia, pneumothorax, or widened mediastinum -Agree with radiology assessment Official radiology report(s): DG Chest 2 View  Result Date: 03/15/2022 CLINICAL DATA:  Shortness of breath EXAM: CHEST - 2 VIEW COMPARISON:  Chest x-ray dated January 29, 2022 FINDINGS: Cardiac and mediastinal contours are within normal limits. Mild ill-defined airspace opacity of the left lung base, unchanged when compared to prior. Lungs are otherwise clear. No large pleural effusion or pneumothorax. IMPRESSION: Mild ill-defined airspace opacity of the left lung base, unchanged when compared with prior exam. Given persistence, recommend chest CT for further evaluation. No evidence of acute airspace opacity. Electronically Signed   By: Yetta Glassman M.D.   On: 03/15/2022 12:43   PROCEDURES: Critical Care performed: No Procedures MEDICATIONS ORDERED IN ED: Medications  predniSONE  (DELTASONE) tablet 60 mg (has no administration in time range)  ipratropium-albuterol (DUONEB) 0.5-2.5 (3) MG/3ML nebulizer solution 3 mL (has no administration in time range)   IMPRESSION / MDM / ASSESSMENT AND PLAN / ED COURSE  I reviewed the triage vital signs and the nursing notes.                             The patient is on the cardiac monitor to evaluate for evidence of arrhythmia and/or significant heart rate changes. Patient's presentation is most consistent with acute presentation with potential threat to life or bodily function. The patient appears to be suffering from a moderate exacerbation of COPD.  Based on the history, exam, CXR/EKG, and further workup I dont suspect any other emergent cause of this presentation, such as pneumonia, acute coronary syndrome, congestive heart failure, pulmonary embolism, or pneumothorax.  ED Interventions: bronchodilators, steroids, antibiotics, reassess  1323 Reassessment: After treatment, the patients shortness of breath is resolved, and their lung exam has returned to baseline. They are comfortable and want to go home.  Rx: Steroids, Antibiotics, Albuterol Disposition: Discharge home with SRP. PCP follow up recommended in next 48hours.   FINAL CLINICAL IMPRESSION(S) / ED DIAGNOSES   Final diagnoses:  SOB (shortness of breath)  COPD exacerbation (HCC)   Rx / DC Orders   ED Discharge Orders          Ordered    albuterol (VENTOLIN HFA) 108 (90 Base) MCG/ACT inhaler  Every 6 hours PRN        03/15/22 1308    Spacer/Aero-Holding Chambers (AEROCHAMBER MV) inhaler        03/15/22 1308    Ipratropium-Albuterol (COMBIVENT) 20-100 MCG/ACT AERS respimat  Every 6 hours        03/15/22 1308    predniSONE (STERAPRED UNI-PAK 21 TAB) 10 MG (21) TBPK tablet        03/15/22 1308           Note:  This document was prepared using Dragon voice recognition software and may include unintentional dictation errors.   Naaman Plummer,  MD 03/15/22 1450

## 2022-03-16 ENCOUNTER — Ambulatory Visit: Payer: Self-pay

## 2022-03-16 NOTE — Telephone Encounter (Signed)
  Chief Complaint: medication assistance Symptoms: NA Frequency: today Pertinent Negatives: NA Disposition: '[]'$ ED /'[]'$ Urgent Care (no appt availability in office) / '[]'$ Appointment(In office/virtual)/ '[]'$  [place] [facility]/ '[x]'$ Home Care/ '[]'$ Refused Recommended Disposition /'[]'$ [place] [facility]/ '[]'$  Follow-up with PCP Additional Notes: Charlie, pt's caregiver who pt lives with was wanting to make sure the medications were the same since they looked different and had different #s on them. Looked medication up and both are same medication just different manufacturer. Advised her that pharmacy might had changed that. She said pt came her from [place] so that could also be why medication looks different. No further assistance needed.   Summary: Discuss medication   Caller requesting a cb regarding 2 different types of medication given to pt by pharmacy   Caller states both medications should be the same   Caller is not listed on DPR   Please assist further      Reason for Disposition  Caller has medicine question only, adult not sick, AND triager answers question  Answer Assessment - Initial Assessment Questions 1. NAME of MEDICINE: "What medicine(s) are you calling about?"     carvedilol 2. QUESTION: "What is your question?" (e.g., double dose of medicine, side effect)     Wanting to make sure the same medication, old pill was round with 256 on pill, new one is oval and ZC41 on it. 3. PRESCRIBER: "Who prescribed the medicine?" Reason: if prescribed by specialist, call should be referred to that group.     Dr. Raliegh Ip  Protocols used: Medication Question Call-A-AH

## 2022-03-20 ENCOUNTER — Other Ambulatory Visit: Payer: Self-pay | Admitting: Family Medicine

## 2022-03-20 DIAGNOSIS — F333 Major depressive disorder, recurrent, severe with psychotic symptoms: Secondary | ICD-10-CM

## 2022-03-20 DIAGNOSIS — I509 Heart failure, unspecified: Secondary | ICD-10-CM

## 2022-03-20 NOTE — Telephone Encounter (Signed)
Medication Refill - Medication: bumetanide (BUMEX) 1 MG tablet,  amitriptyline (ELAVIL) 10 MG tablet, and OLANZapine (ZYPREXA) 2.5 MG tablet  Has the patient contacted their pharmacy? Yes.   Pt told to contact provider  Preferred Pharmacy (with phone number or street name):  [facility] [address] Phone:  [phone]  Fax:  [phone]     Has the patient been seen for an appointment in the last year OR does the patient have an upcoming appointment? Yes.    Agent: Please be advised that RX refills may take up to 3 business days. We ask that you follow-up with your pharmacy.

## 2022-03-21 MED ORDER — OLANZAPINE 2.5 MG PO TABS: 2.5000 mg | ORAL_TABLET | Freq: Two times a day (BID) | ORAL | 1 refills | Status: DC

## 2022-03-21 MED ORDER — AMITRIPTYLINE HCL 10 MG PO TABS: 10.0000 mg | ORAL_TABLET | Freq: Every day | ORAL | 1 refills | Status: DC

## 2022-03-21 MED ORDER — BUMETANIDE 1 MG PO TABS: 0.5000 mg | ORAL_TABLET | Freq: Two times a day (BID) | ORAL | 1 refills | Status: DC

## 2022-03-21 NOTE — Telephone Encounter (Signed)
Requested medication (s) are due for refill today: yes  Requested medication (s) are on the active medication list: yes  Last refill:  01/17/22  Future visit scheduled: yes  Notes to clinic:  Unable to refill per protocol, last refill by another provider.  Routing for review     Requested Prescriptions  Pending Prescriptions Disp Refills   amitriptyline (ELAVIL) 10 MG tablet      Sig: Take 1 tablet (10 mg total) by mouth at bedtime.     Psychiatry:  Antidepressants - Heterocyclics (TCAs) Passed - 03/20/2022  4:53 PM      Passed - Completed PHQ-2 or PHQ-9 in the last 360 days      Passed - Valid encounter within last 6 months    Recent Outpatient Visits           1 month ago Essential hypertension   [place], DO   1 year ago Severe episode of recurrent major depressive disorder, with psychotic features ([place])   [place], DO   1 year ago Seasonal allergic rhinitis due to other allergic trigger   [facility] Olin Hauser, DO   1 year ago COPD with acute exacerbation [facility])   [facility] Olin Hauser, DO   1 year ago Upper respiratory tract infection, unspecified type   [facility] Jon Billings, NP       Future Appointments             In 2 weeks Gusler, Christin Z, NP [facility]             bumetanide (BUMEX) 1 MG tablet      Sig: Take 0.5 tablets (0.5 mg total) by mouth 2 (two) times daily.     Cardiovascular:  Diuretics - Loop Failed - 03/20/2022  4:53 PM      Failed - Ca in normal range and within 180 days    Calcium  Date Value Ref Range Status  03/15/2022 8.8 (L) 8.9 - 10.3 mg/dL Final   Calcium, Corrected  Date Value Ref Range Status  08/15/2016 9.3  Final         Passed - K in normal range and within 180 days    Potassium  Date Value Ref Range Status  03/15/2022 4.1 3.5 - 5.1 mmol/L  Final         Passed - Na in normal range and within 180 days    Sodium  Date Value Ref Range Status  03/15/2022 143 135 - 145 mmol/L Final  07/10/2018 140 134 - 144 mmol/L Final         Passed - Cr in normal range and within 180 days    Creatinine, Ser  Date Value Ref Range Status  03/15/2022 1.15 0.61 - 1.24 mg/dL Final   Creatinine, Urine  Date Value Ref Range Status  12/13/2019 631 mg/dL Final    Comment:    RESULTS CONFIRMED BY MANUAL DILUTION Performed at [facility], [address] [place], [place] [address]          Passed - Cl in normal range and within 180 days    Chloride  Date Value Ref Range Status  03/15/2022 111 98 - 111 mmol/L Final         Passed - Mg Level in normal range and within 180 days    Magnesium  Date Value Ref Range Status  01/22/2022  2.0 1.7 - 2.4 mg/dL Final    Comment:    Performed at [facility], [address] [place], [place] [address]         Passed - Last BP in normal range    BP Readings from Last 1 Encounters:  03/15/22 133/76         Passed - Valid encounter within last 6 months    Recent Outpatient Visits           1 month ago Essential hypertension   [place], Devonne Doughty, DO   1 year ago Severe episode of recurrent major depressive disorder, with psychotic features ([place])   [place], DO   1 year ago Seasonal allergic rhinitis due to other allergic trigger   [facility] Olin Hauser, DO   1 year ago COPD with acute exacerbation [facility])   [facility] Olin Hauser, DO   1 year ago Upper respiratory tract infection, unspecified type   [facility] Jon Billings, NP       Future Appointments             In 2 weeks Gusler, Christin Z, NP [facility]             OLANZapine (ZYPREXA) 2.5 MG tablet      Sig: Take 1 tablet (2.5 mg total)  by mouth 2 (two) times daily.     Not Delegated - Psychiatry:  Antipsychotics - Second Generation (Atypical) - olanzapine Failed - 03/20/2022  4:53 PM      Failed - This refill cannot be delegated      Failed - Lipid Panel in normal range within the last 12 months    No results found for: "CHOL", "POCCHOL", "CHOLTOT" No results found for: "[place]", "LDLC", "HIRISKLDL", "POCLDL", "LDLDIRECT", "REALLDLC", "TOTLDLC" No results found for: "HDL", "POCHDL" Triglycerides  Date Value Ref Range Status  03/30/2019 228 (H) <150 mg/dL Final    Comment:    Performed at [facility], [address] [place], [place] [address]         Failed - CMP within normal limits and completed in the last 12 months    Albumin  Date Value Ref Range Status  03/15/2022 3.7 3.5 - 5.0 g/dL Final  07/10/2018 4.2 3.6 - 4.6 g/dL Final    Comment:                  **Please note reference interval change**   Alkaline Phosphatase  Date Value Ref Range Status  03/15/2022 64 38 - 126 U/L Final   ALT  Date Value Ref Range Status  03/15/2022 11 0 - 44 U/L Final   AST  Date Value Ref Range Status  03/15/2022 22 15 - 41 U/L Final   BUN  Date Value Ref Range Status  03/15/2022 17 8 - 23 mg/dL Final  07/10/2018 25 8 - 27 mg/dL Final   Calcium  Date Value Ref Range Status  03/15/2022 8.8 (L) 8.9 - 10.3 mg/dL Final   Calcium, Corrected  Date Value Ref Range Status  08/15/2016 9.3  Final   CO2  Date Value Ref Range Status  03/15/2022 24 22 - 32 mmol/L Final   Bicarbonate  Date Value Ref Range Status  09/13/2019 29.7 (H) 20.0 - 28.0 mmol/L Final   Creatinine, Ser  Date Value Ref Range Status  03/15/2022 1.15 0.61 - 1.24 mg/dL Final  Creatinine, Urine  Date Value Ref Range Status  12/13/2019 631 mg/dL Final    Comment:    RESULTS CONFIRMED BY MANUAL DILUTION Performed at [facility], [address] [place], [place] [address]    Glucose  Date Value Ref Range Status   08/15/2016 122  Final   Glucose, Bld  Date Value Ref Range Status  03/15/2022 127 (H) 70 - 99 mg/dL Final    Comment:    Glucose reference range applies only to samples taken after fasting for at least 8 hours.   Glucose-Capillary  Date Value Ref Range Status  09/13/2019 102 (H) 70 - 99 mg/dL Final    Comment:    Glucose reference range applies only to samples taken after fasting for at least 8 hours.   Potassium  Date Value Ref Range Status  03/15/2022 4.1 3.5 - 5.1 mmol/L Final   Sodium  Date Value Ref Range Status  03/15/2022 143 135 - 145 mmol/L Final  07/10/2018 140 134 - 144 mmol/L Final   Total Bilirubin  Date Value Ref Range Status  03/15/2022 0.6 0.3 - 1.2 mg/dL Final   Bilirubin Total  Date Value Ref Range Status  07/10/2018 0.2 0.0 - 1.2 mg/dL Final   Bilirubin, Total  Date Value Ref Range Status  08/15/2016 0.3  Final   Protein, ur  Date Value Ref Range Status  01/29/2022 NEGATIVE NEGATIVE mg/dL Final   Total Protein  Date Value Ref Range Status  03/15/2022 6.8 6.5 - 8.1 g/dL Final  07/10/2018 6.2 6.0 - 8.5 g/dL Final   GFR calc Af Amer  Date Value Ref Range Status  02/09/2020 >60 >60 mL/min Final   GFR, Estimated  Date Value Ref Range Status  03/15/2022 >60 >60 mL/min Final    Comment:    (NOTE) Calculated using the CKD-EPI Creatinine Equation (2021)          Passed - TSH in normal range and within 360 days    TSH  Date Value Ref Range Status  01/22/2022 2.486 0.350 - 4.500 uIU/mL Final    Comment:    Performed by a 3rd Generation assay with a functional sensitivity of <=0.01 uIU/mL. Performed at [facility], [address] [place], [place] [address]          Passed - Completed PHQ-2 or PHQ-9 in the last 360 days      Passed - Last BP in normal range    BP Readings from Last 1 Encounters:  03/15/22 133/76         Passed - Last Heart Rate in normal range    Pulse Readings from Last 1 Encounters:  03/15/22 90          Passed - Valid encounter within last 6 months    Recent Outpatient Visits           1 month ago Essential hypertension   [place], DO   1 year ago Severe episode of recurrent major depressive disorder, with psychotic features [facility])   [place], DO   1 year ago Seasonal allergic rhinitis due to other allergic trigger   [facility] Olin Hauser, DO   1 year ago COPD with acute exacerbation [facility])   [place], DO   1 year ago Upper respiratory tract infection, unspecified type   [facility] Jon Billings, NP  Future Appointments             In 2 weeks Gusler, Christin Z, NP [facility]            Passed - CBC within normal limits and completed in the last 12 months    WBC  Date Value Ref Range Status  03/15/2022 3.3 (L) 4.0 - 10.5 K/uL Final   RBC  Date Value Ref Range Status  03/15/2022 4.20 (L) 4.22 - 5.81 MIL/uL Final   Hemoglobin  Date Value Ref Range Status  03/15/2022 10.1 (L) 13.0 - 17.0 g/dL Final   HCT  Date Value Ref Range Status  03/15/2022 34.4 (L) 39.0 - 52.0 % Final   MCHC  Date Value Ref Range Status  03/15/2022 29.4 (L) 30.0 - 36.0 g/dL Final   [facility]  Date Value Ref Range Status  03/15/2022 24.0 (L) 26.0 - 34.0 pg Final   MCV  Date Value Ref Range Status  03/15/2022 81.9 80.0 - 100.0 fL Final   No results found for: "PLTCOUNTKUC", "LABPLAT", "POCPLA" RDW  Date Value Ref Range Status  03/15/2022 17.2 (H) 11.5 - 15.5 % Final

## 2022-03-26 NOTE — Progress Notes (Unsigned)
PROVIDER NOTE: Information contained herein reflects review and annotations entered in association with encounter. Interpretation of such information and data should be left to medically-trained personnel. Information provided to patient can be located elsewhere in the medical record under "Patient Instructions". Document created using STT-dictation technology, any transcriptional errors that may result from process are unintentional.    Patient: Aaron Gibbs  Service Category: E/M  Provider: Gaspar Cola, MD  DOB: 1934-09-06  DOS: 03/29/2022  Referring Provider: [name] *  MRN: [id number]  Specialty: Interventional Pain Management  PCP: Olin Hauser, DO  Type: Established Patient  Setting: Ambulatory outpatient    Location: Office  Delivery: Face-to-face     HPI  [name], a 87 y.o. year old male, is here today because of his No primary diagnosis found.. [name] primary complain today is No chief complaint on file. Last encounter: My last encounter with him was on 02/27/2022. Pertinent problems: [name] has Chronic pain syndrome; DISH (diffuse idiopathic skeletal hyperostosis); Osteoarthritis of multiple joints; Chronic knee pain (1ry area of Pain) (Bilateral) (L>R); Chronic low back pain (Bilateral) w/o sciatica; Somatic symptom disorder; Tricompartment osteoarthritis of knee (Left); Osteoarthritis of knee (Bilateral); Osteoarthritis of patellofemoral joint (Right); Neurogenic pain; Lateral meniscal tear, sequela (Left); Medial meniscal tear, sequela (Left); Patellar tendinosis (Right); Osteoarthritis of knee (Left); Abnormal MRI, knee (08/05/2018) (Left); and Chronic knee pain (Left) on their pertinent problem list. Pain Assessment: Severity of   is reported as a  /10. Location:    / . Onset:  . Quality:  . Timing:  . Modifying factor(s):  Marland Kitchen Vitals:  vitals were not taken for this visit.   Reason for encounter:  *** . ***  Pharmacotherapy  Assessment  Analgesic: No opioid analgesics prescribed by our practice.  Buprenorphine 10 mcg/h patch q. 7 days.  High risk for SUD.  History of suicidal attempts and noncompliance with medication intake. ED visits thought to be due to medication binging. MME/day: 0 mg/day.   Monitoring: [place] PMP: PDMP reviewed during this encounter.       Pharmacotherapy: No side-effects or adverse reactions reported. Compliance: No problems identified. Effectiveness: Clinically acceptable.  No notes on file  No results found for: "CBDTHCR" No results found for: "D8THCCBX" No results found for: "D9THCCBX"  UDS:  Summary  Date Value Ref Range Status  01/26/2021 Note  Final    Comment:    ==================================================================== [employer] Select 13 (MW) ==================================================================== Test                             Result       Flag       Units  Drug Present and Declared for Prescription Verification   Lorazepam                      796          EXPECTED   ng/mg creat    Source of lorazepam is a scheduled prescription medication.    Buprenorphine                  3            EXPECTED   ng/mg creat    Sources of buprenorphine include scheduled prescription medications.  Drug Absent but Declared for Prescription Verification   Tramadol  Not Detected UNEXPECTED ng/mg creat ==================================================================== Test                      Result    Flag   Units      Ref Range   Creatinine              156              mg/dL      >=20 ==================================================================== Declared Medications:  The flagging and interpretation on this report are based on the  following declared medications.  Unexpected results may arise from  inaccuracies in the declared medications.   **Note: The testing scope of this panel includes these medications:   Lorazepam  (Ativan)  Tramadol (Ultram)   **Note: The testing scope of this panel does not include small to  moderate amounts of these reported medications:   Buprenorphine Patch (BuTrans)   **Note: The testing scope of this panel does not include the  following reported medications:   Acetaminophen (Tylenol)  Albuterol (Ventolin HFA)  Amlodipine (Norvasc)  Azelastine  Diclofenac (Voltaren)  Dorzolamide  Duloxetine (Cymbalta)  Fenofibrate (TriCor)  Fluticasone (Flonase)  Hydroxyzine (Atarax)  Ibuprofen (Advil)  Iron  Montelukast (Singulair)  Multivitamin  Mupirocin (Bactroban)  Pantoprazole (Protonix)  Prednisolone  Quetiapine (Seroquel)  Risperidone (Risperdal)  Tamsulosin (Flomax)  Trazodone (Desyrel) ==================================================================== For clinical consultation, please call [phone]. ====================================================================       ROS  Constitutional: Denies any fever or chills Gastrointestinal: No reported hemesis, hematochezia, vomiting, or acute GI distress Musculoskeletal: Denies any acute onset joint swelling, redness, loss of ROM, or weakness Neurological: No reported episodes of acute onset apraxia, aphasia, dysarthria, agnosia, amnesia, paralysis, loss of coordination, or loss of consciousness  Medication Review  AeroChamber MV, Arnicare, Budeson-Glycopyrrol-Formoterol, DULoxetine, Ipratropium-Albuterol, Melatonin, OLANZapine, Propylene Glycol, acetaminophen, albuterol, amitriptyline, bumetanide, buprenorphine, carvedilol, cetirizine, diclofenac Sodium, dorzolamide-timolol, finasteride, fluticasone, latanoprost, losartan, pantoprazole, predniSONE, prednisoLONE acetate, tamsulosin, and traZODone  History Review  Allergy: [name] is allergic to azithromycin. Drug: [name]  reports no history of drug use. Alcohol:  reports that he does not currently use alcohol. Tobacco:  reports that he has  quit smoking. His smoking use included cigarettes. His smokeless tobacco use includes chew. Social: [name]  reports that he has quit smoking. His smoking use included cigarettes. His smokeless tobacco use includes chew. He reports that he does not currently use alcohol. He reports that he does not use drugs. Medical:  has a past medical history of Anxiety, Cancer ([place]), COPD (chronic obstructive pulmonary disease) ([place]), Glaucoma, and Osteoporosis. Surgical: [name]  has a past surgical history that includes Cholecystectomy and Transurethral resection of prostate (N/A, 2008). Family: family history includes Depression in his mother; Heart disease in his father.  Laboratory Chemistry Profile   Renal Lab Results  Component Value Date   BUN 17 03/15/2022   CREATININE 1.15 03/15/2022   LABCREA 631 12/13/2019   BCR 28 (H) 07/10/2018   GFRAA >60 02/09/2020   GFRNONAA >60 03/15/2022    Hepatic Lab Results  Component Value Date   AST 22 03/15/2022   ALT 11 03/15/2022   ALBUMIN 3.7 03/15/2022   ALKPHOS 64 03/15/2022   LIPASE 24 01/22/2022   AMMONIA 25 09/13/2019    Electrolytes Lab Results  Component Value Date   NA 143 03/15/2022   K 4.1 03/15/2022   CL 111 03/15/2022   CALCIUM 8.8 (L) 03/15/2022   MG 2.0 01/22/2022   PHOS 3.4  03/30/2019    Bone Lab Results  Component Value Date   VD25OH 26 08/15/2016   25OHVITD1 34 07/10/2018   25OHVITD2 <1.0 07/10/2018   25OHVITD3 34 07/10/2018    Inflammation (CRP: Acute Phase) (ESR: Chronic Phase) Lab Results  Component Value Date   CRP 5 07/10/2018   ESRSEDRATE 20 07/10/2018   LATICACIDVEN 0.9 01/29/2022         Note: Above Lab results reviewed.  Recent Imaging Review  DG Chest 2 View CLINICAL DATA:  Shortness of breath  EXAM: CHEST - 2 VIEW  COMPARISON:  Chest x-ray dated January 29, 2022  FINDINGS: Cardiac and mediastinal contours are within normal limits. Mild ill-defined airspace opacity of the left lung  base, unchanged when compared to prior. Lungs are otherwise clear. No large pleural effusion or pneumothorax.  IMPRESSION: Mild ill-defined airspace opacity of the left lung base, unchanged when compared with prior exam. Given persistence, recommend chest CT for further evaluation. No evidence of acute airspace opacity.  Electronically Signed   By: Yetta Glassman M.D.   On: 03/15/2022 12:43 Note: Reviewed        Physical Exam  General appearance: Well nourished, well developed, and well hydrated. In no apparent acute distress Mental status: Alert, oriented x 3 (person, place, & time)       Respiratory: No evidence of acute respiratory distress Eyes: PERLA Vitals: There were no vitals taken for this visit. BMI: Estimated body mass index is 32.28 kg/m as calculated from the following:   Height as of 03/15/22: '5\' 10"'  (1.778 m).   Weight as of 03/15/22: 225 lb (102.1 kg). Ideal: Ideal body weight: 73 kg (160 lb 15 oz) Adjusted ideal body weight: 84.6 kg (186 lb 9 oz)  Assessment   Diagnosis Status  No diagnosis found. Controlled Controlled Controlled   Updated Problems: No problems updated.   Plan of Care  Problem-specific:  No problem-specific Assessment & Plan notes found for this encounter.  [name] has a current medication list which includes the following long-term medication(s): albuterol, albuterol, amitriptyline, bumetanide, buprenorphine, carvedilol, cetirizine, diclofenac sodium, duloxetine, fluticasone, ipratropium-albuterol, losartan, olanzapine, pantoprazole, and trazodone.  Pharmacotherapy (Medications Ordered): No orders of the defined types were placed in this encounter.  Orders:  No orders of the defined types were placed in this encounter.  Follow-up plan:   No follow-ups on file.     Interventional Therapies  Risk  Complexity Considerations:   Patient is legally blind. High risk for substance use disorder.  The patient has  documented suicidal attempt using medications.  In addition, we have documented the patient to have a binge use pattern and been noncompliant with the instructions on how to use the medications.  This is a reason why went to a buprenorphine patch.   Planned  Pending:   Repeat bilateral genicular nerve RFA #3    Under consideration:   Palliative bilateral genicular nerve RFA maintenance treatments   Completed:   Palliative bilateral IA Hyalgan knee injections Sx2  Diagnostic bilateral genicular NB x1  Palliative right genicular nerve RFA x2 (04/06/2020) (100/100/50/100)  Palliative left genicular nerve RFA x3 (09/28/2020) (100/50/50/90)    Therapeutic  Palliative (PRN) options:   Palliative bilateral IA Hyalgan knee injections S3/N1  Diagnostic bilateral genicular NB #2  Palliative bilateral genicular nerve RFA #3      Recent Visits Date Type Provider Dept  02/27/22 Office Visit Milinda Pointer, MD [facility]  Showing recent visits within past 90 days and  meeting all other requirements Future Appointments Date Type Provider Dept  03/29/22 Appointment Milinda Pointer, MD [facility]  Showing future appointments within next 90 days and meeting all other requirements  I discussed the assessment and treatment plan with the patient. The patient was provided an opportunity to ask questions and all were answered. The patient agreed with the plan and demonstrated an understanding of the instructions.  Patient advised to call back or seek an in-person evaluation if the symptoms or condition worsens.  Duration of encounter: *** minutes.  Total time on encounter, as per AMA guidelines included both the face-to-face and non-face-to-face time personally spent by the physician and/or other qualified health care professional(s) on the day of the encounter (includes time in activities that require the physician or other qualified health care professional and does not  include time in activities normally performed by clinical staff). Physician's time may include the following activities when performed: preparing to see the patient (eg, review of tests, pre-charting review of records) obtaining and/or reviewing separately obtained history performing a medically appropriate examination and/or evaluation counseling and educating the patient/family/caregiver ordering medications, tests, or procedures referring and communicating with other health care professionals (when not separately reported) documenting clinical information in the electronic or other health record independently interpreting results (not separately reported) and communicating results to the patient/ family/caregiver care coordination (not separately reported)  Note by: Gaspar Cola, MD Date: 03/29/2022; Time: 4:59 PM

## 2022-03-29 ENCOUNTER — Ambulatory Visit: Payer: Medicare Other | Attending: Pain Medicine | Admitting: Pain Medicine

## 2022-03-29 ENCOUNTER — Encounter: Payer: Self-pay | Admitting: Pain Medicine

## 2022-03-29 VITALS — BP 149/83 | HR 72 | Temp 97.3°F | Resp 16 | Ht 70.0 in | Wt 225.0 lb

## 2022-03-29 DIAGNOSIS — M4810 Ankylosing hyperostosis [Forestier], site unspecified: Secondary | ICD-10-CM | POA: Diagnosis present

## 2022-03-29 DIAGNOSIS — M545 Low back pain, unspecified: Secondary | ICD-10-CM | POA: Insufficient documentation

## 2022-03-29 DIAGNOSIS — M1711 Unilateral primary osteoarthritis, right knee: Secondary | ICD-10-CM | POA: Diagnosis present

## 2022-03-29 DIAGNOSIS — M170 Bilateral primary osteoarthritis of knee: Secondary | ICD-10-CM

## 2022-03-29 DIAGNOSIS — M25561 Pain in right knee: Secondary | ICD-10-CM | POA: Diagnosis present

## 2022-03-29 DIAGNOSIS — Z79891 Long term (current) use of opiate analgesic: Secondary | ICD-10-CM

## 2022-03-29 DIAGNOSIS — G8929 Other chronic pain: Secondary | ICD-10-CM | POA: Insufficient documentation

## 2022-03-29 DIAGNOSIS — S83282S Other tear of lateral meniscus, current injury, left knee, sequela: Secondary | ICD-10-CM | POA: Diagnosis not present

## 2022-03-29 DIAGNOSIS — S83242S Other tear of medial meniscus, current injury, left knee, sequela: Secondary | ICD-10-CM | POA: Insufficient documentation

## 2022-03-29 DIAGNOSIS — G894 Chronic pain syndrome: Secondary | ICD-10-CM | POA: Diagnosis present

## 2022-03-29 DIAGNOSIS — M1712 Unilateral primary osteoarthritis, left knee: Secondary | ICD-10-CM | POA: Insufficient documentation

## 2022-03-29 DIAGNOSIS — Z79899 Other long term (current) drug therapy: Secondary | ICD-10-CM | POA: Insufficient documentation

## 2022-03-29 DIAGNOSIS — M25562 Pain in left knee: Secondary | ICD-10-CM

## 2022-03-29 DIAGNOSIS — M7650 Patellar tendinitis, unspecified knee: Secondary | ICD-10-CM | POA: Insufficient documentation

## 2022-03-29 MED ORDER — BUPRENORPHINE 10 MCG/HR TD PTWK: 1.0000 | MEDICATED_PATCH | TRANSDERMAL | 0 refills | Status: DC

## 2022-03-29 NOTE — Patient Instructions (Signed)
______________________________________________________________________  Preparing for Procedure with Sedation  NOTICE: Due to recent regulatory changes, starting on January 17, 2021, procedures requiring intravenous (IV) sedation will no longer be performed at the [facility].  These types of procedures are required to be performed at [facility] ambulatory surgery facility.  We are very sorry for the inconvenience.  Procedure appointments are limited to planned procedures: No Prescription Refills. No disability issues will be discussed. No medication changes will be discussed.  Instructions: Oral Intake: Do not eat or drink anything for at least 8 hours prior to your procedure. (Exception: Blood Pressure Medication. See below.) Transportation: A driver is required. You may not drive yourself after the procedure. Blood Pressure Medicine: Do not forget to take your blood pressure medicine with a sip of water the morning of the procedure. If your Diastolic (lower reading) is above 100 mmHg, elective cases will be cancelled/rescheduled. Blood thinners: These will need to be stopped for procedures. Notify our staff if you are taking any blood thinners. Depending on which one you take, there will be specific instructions on how and when to stop it. Diabetics on insulin: Notify the staff so that you can be scheduled 1st case in the morning. If your diabetes requires high dose insulin, take only  of your normal insulin dose the morning of the procedure and notify the staff that you have done so. Preventing infections: Shower with an antibacterial soap the morning of your procedure. Build-up your immune system: Take 1000 mg of Vitamin C with every meal (3 times a day) the day prior to your procedure. Antibiotics: Inform the staff if you have a condition or reason that requires you to take antibiotics before dental procedures. Pregnancy: If you are pregnant, call and cancel the procedure. Sickness: If  you have a cold, fever, or any active infections, call and cancel the procedure. Arrival: You must be in the facility at least 30 minutes prior to your scheduled procedure. Children: Do not bring children with you. Dress appropriately: There is always the possibility that your clothing may get soiled. Valuables: Do not bring any jewelry or valuables.  Reasons to call and reschedule or cancel your procedure: (Following these recommendations will minimize the risk of a serious complication.) Surgeries: Avoid having procedures within 2 weeks of any surgery. (Avoid for 2 weeks before or after any surgery). Flu Shots: Avoid having procedures within 2 weeks of a flu shots. (Avoid for 2 weeks before or after immunizations). Barium: Avoid having a procedure within 7-10 days after having had a radiological study involving the use of radiological contrast. (Myelograms, Barium swallow or enema study). Heart attacks: Avoid any elective procedures or surgeries for the initial 6 months after a "Myocardial Infarction" (Heart Attack). Blood thinners: It is imperative that you stop these medications before procedures. Let us know if you if you take any blood thinner.  Infection: Avoid procedures during or within two weeks of an infection (including chest colds or gastrointestinal problems). Symptoms associated with infections include: Localized redness, fever, chills, night sweats or profuse sweating, burning sensation when voiding, cough, congestion, stuffiness, runny nose, sore throat, diarrhea, nausea, vomiting, cold or Flu symptoms, recent or current infections. It is specially important if the infection is over the area that we intend to treat. Heart and lung problems: Symptoms that may suggest an active cardiopulmonary problem include: cough, chest pain, breathing difficulties or shortness of breath, dizziness, ankle swelling, uncontrolled high or unusually low blood pressure, and/or palpitations. If you are    experiencing any of these symptoms, cancel your procedure and contact your primary care physician for an evaluation.  Remember:  Regular Business hours are:  Monday to Thursday 8:00 AM to 4:00 PM  Provider's Schedule: Isaiahs Chancy, MD:  Procedure days: Tuesday and Thursday 7:30 AM to 4:00 PM  Bilal Lateef, MD:  Procedure days: Monday and Wednesday 7:30 AM to 4:00 PM ______________________________________________________________________  ____________________________________________________________________________________________  General Risks and Possible Complications  Patient Responsibilities: It is important that you read this as it is part of your informed consent. It is our duty to inform you of the risks and possible complications associated with treatments offered to you. It is your responsibility as a patient to read this and to ask questions about anything that is not clear or that you believe was not covered in this document.  Patient's Rights: You have the right to refuse treatment. You also have the right to change your mind, even after initially having agreed to have the treatment done. However, under this last option, if you wait until the last second to change your mind, you may be charged for the materials used up to that point.  Introduction: Medicine is not an exact science. Everything in Medicine, including the lack of treatment(s), carries the potential for danger, harm, or loss (which is by definition: Risk). In Medicine, a complication is a secondary problem, condition, or disease that can aggravate an already existing one. All treatments carry the risk of possible complications. The fact that a side effects or complications occurs, does not imply that the treatment was conducted incorrectly. It must be clearly understood that these can happen even when everything is done following the highest safety standards.  No treatment: You can choose not to proceed with the  proposed treatment alternative. The "PRO(s)" would include: avoiding the risk of complications associated with the therapy. The "CON(s)" would include: not getting any of the treatment benefits. These benefits fall under one of three categories: diagnostic; therapeutic; and/or palliative. Diagnostic benefits include: getting information which can ultimately lead to improvement of the disease or symptom(s). Therapeutic benefits are those associated with the successful treatment of the disease. Finally, palliative benefits are those related to the decrease of the primary symptoms, without necessarily curing the condition (example: decreasing the pain from a flare-up of a chronic condition, such as incurable terminal cancer).  General Risks and Complications: These are associated to most interventional treatments. They can occur alone, or in combination. They fall under one of the following six (6) categories: no benefit or worsening of symptoms; bleeding; infection; nerve damage; allergic reactions; and/or death. No benefits or worsening of symptoms: In Medicine there are no guarantees, only probabilities. No healthcare provider can ever guarantee that a medical treatment will work, they can only state the probability that it may. Furthermore, there is always the possibility that the condition may worsen, either directly, or indirectly, as a consequence of the treatment. Bleeding: This is more common if the patient is taking a blood thinner, either prescription or over the counter (example: Goody Powders, Fish oil, Aspirin, Garlic, etc.), or if suffering a condition associated with impaired coagulation (example: Hemophilia, cirrhosis of the liver, low platelet counts, etc.). However, even if you do not have one on these, it can still happen. If you have any of these conditions, or take one of these drugs, make sure to notify your treating physician. Infection: This is more common in patients with a compromised  immune system, either due to disease (example:   diabetes, cancer, human immunodeficiency virus [HIV], etc.), or due to medications or treatments (example: therapies used to treat cancer and rheumatological diseases). However, even if you do not have one on these, it can still happen. If you have any of these conditions, or take one of these drugs, make sure to notify your treating physician. Nerve Damage: This is more common when the treatment is an invasive one, but it can also happen with the use of medications, such as those used in the treatment of cancer. The damage can occur to small secondary nerves, or to large primary ones, such as those in the spinal cord and brain. This damage may be temporary or permanent and it may lead to impairments that can range from temporary numbness to permanent paralysis and/or brain death. Allergic Reactions: Any time a substance or material comes in contact with our body, there is the possibility of an allergic reaction. These can range from a mild skin rash (contact dermatitis) to a severe systemic reaction (anaphylactic reaction), which can result in death. Death: In general, any medical intervention can result in death, most of the time due to an unforeseen complication. ____________________________________________________________________________________________ ____________________________________________________________________________________________  Medication Rules  Purpose: To inform patients, and their family members, of our rules and regulations.  Applies to: All patients receiving prescriptions (written or electronic).  Pharmacy of record: Pharmacy where electronic prescriptions will be sent. If written prescriptions are taken to a different pharmacy, please inform the nursing staff. The pharmacy listed in the electronic medical record should be the one where you would like electronic prescriptions to be sent.  Electronic prescriptions: In compliance  with the [place] Strengthen Opioid Misuse Prevention (STOP) Act of 2017 (Session Law 2017-74/H243), effective June 19, 2018, all controlled substances must be electronically prescribed. Calling prescriptions to the pharmacy will cease to exist.  Prescription refills: Only during scheduled appointments. Applies to all prescriptions.  NOTE: The following applies primarily to controlled substances (Opioid* Pain Medications).   Type of encounter (visit): For patients receiving controlled substances, face-to-face visits are required. (Not an option or up to the patient.)  Patient's responsibilities: Pain Pills: Bring all pain pills to every appointment (except for procedure appointments). Pill Bottles: Bring pills in original pharmacy bottle. Always bring the newest bottle. Bring bottle, even if empty. Medication refills: You are responsible for knowing and keeping track of what medications you take and those you need refilled. The day before your appointment: write a list of all prescriptions that need to be refilled. The day of the appointment: give the list to the admitting nurse. Prescriptions will be written only during appointments. No prescriptions will be written on procedure days. If you forget a medication: it will not be "Called in", "Faxed", or "electronically sent". You will need to get another appointment to get these prescribed. No early refills. Do not call asking to have your prescription filled early. Prescription Accuracy: You are responsible for carefully inspecting your prescriptions before leaving our office. Have the discharge nurse carefully go over each prescription with you, before taking them home. Make sure that your name is accurately spelled, that your address is correct. Check the name and dose of your medication to make sure it is accurate. Check the number of pills, and the written instructions to make sure they are clear and accurate. Make sure that you are given  enough medication to last until your next medication refill appointment. Taking Medication: Take medication as prescribed. When it comes to controlled substances, taking less   pills or less frequently than prescribed is permitted and encouraged. Never take more pills than instructed. Never take medication more frequently than prescribed.  Inform other Doctors: Always inform, all of your healthcare providers, of all the medications you take. Pain Medication from other Providers: You are not allowed to accept any additional pain medication from any other Doctor or Healthcare provider. There are two exceptions to this rule. (see below) In the event that you require additional pain medication, you are responsible for notifying us, as stated below. Cough Medicine: Often these contain an opioid, such as codeine or hydrocodone. Never accept or take cough medicine containing these opioids if you are already taking an opioid* medication. The combination may cause respiratory failure and death. Medication Agreement: You are responsible for carefully reading and following our Medication Agreement. This must be signed before receiving any prescriptions from our practice. Safely store a copy of your signed Agreement. Violations to the Agreement will result in no further prescriptions. (Additional copies of our Medication Agreement are available upon request.) Laws, Rules, & Regulations: All patients are expected to follow all Federal and State Laws, Statutes, Rules, & Regulations. Ignorance of the Laws does not constitute a valid excuse.  Illegal drugs and Controlled Substances: The use of illegal substances (including, but not limited to marijuana and its derivatives) and/or the illegal use of any controlled substances is strictly prohibited. Violation of this rule may result in the immediate and permanent discontinuation of any and all prescriptions being written by our practice. The use of any illegal substances is  prohibited. Adopted CDC guidelines & recommendations: Target dosing levels will be at or below 60 MME/day. Use of benzodiazepines** is not recommended.  Exceptions: There are only two exceptions to the rule of not receiving pain medications from other Healthcare Providers. Exception #1 (Emergencies): In the event of an emergency (i.e.: accident requiring emergency care), you are allowed to receive additional pain medication. However, you are responsible for: As soon as you are able, call our office [phone], at any time of the day or night, and leave a message stating your name, the date and nature of the emergency, and the name and dose of the medication prescribed. In the event that your call is answered by a member of our staff, make sure to document and save the date, time, and the name of the person that took your information.  Exception #2 (Planned Surgery): In the event that you are scheduled by another doctor or dentist to have any type of surgery or procedure, you are allowed (for a period no longer than 30 days), to receive additional pain medication, for the acute post-op pain. However, in this case, you are responsible for picking up a copy of our "Post-op Pain Management for Surgeons" handout, and giving it to your surgeon or dentist. This document is available at our office, and does not require an appointment to obtain it. Simply go to our office during business hours (Monday-Thursday from 8:00 AM to 4:00 PM) (Friday 8:00 AM to 12:00 Noon) or if you have a scheduled appointment with us, prior to your surgery, and ask for it by name. In addition, you are responsible for: calling our office [phone], at any time of the day or night, and leaving a message stating your name, name of your surgeon, type of surgery, and date of procedure or surgery. Failure to comply with your responsibilities may result in termination of therapy involving the controlled substances. Medication Agreement  Violation.   Following the above rules, including your responsibilities will help you in avoiding a Medication Agreement Violation ("Breaking your Pain Medication Contract").  *Opioid medications include: morphine, codeine, oxycodone, oxymorphone, hydrocodone, hydromorphone, meperidine, tramadol, tapentadol, buprenorphine, fentanyl, methadone. **Benzodiazepine medications include: diazepam (Valium), alprazolam (Xanax), clonazepam (Klonopine), lorazepam (Ativan), clorazepate (Tranxene), chlordiazepoxide (Librium), estazolam (Prosom), oxazepam (Serax), temazepam (Restoril), triazolam (Halcion) (Last updated: 03/16/2021) ____________________________________________________________________________________________  ____________________________________________________________________________________________  Medication Recommendations and Reminders  Applies to: All patients receiving prescriptions (written and/or electronic).  Medication Rules & Regulations: These rules and regulations exist for your safety and that of others. They are not flexible and neither are we. Dismissing or ignoring them will be considered "non-compliance" with medication therapy, resulting in complete and irreversible termination of such therapy. (See document titled "Medication Rules" for more details.) In all conscience, because of safety reasons, we cannot continue providing a therapy where the patient does not follow instructions.  Pharmacy of record:  Definition: This is the pharmacy where your electronic prescriptions will be sent.  We do not endorse any particular pharmacy, however, we have experienced problems with [facility] not securing enough medication supply for the community. We do not restrict you in your choice of pharmacy. However, once we write for your prescriptions, we will NOT be re-sending more prescriptions to fix restricted supply problems created by your pharmacy, or your insurance.  The pharmacy listed in  the electronic medical record should be the one where you want electronic prescriptions to be sent. If you choose to change pharmacy, simply notify our nursing staff.  Recommendations: Keep all of your pain medications in a safe place, under lock and key, even if you live alone. We will NOT replace lost, stolen, or damaged medication. After you fill your prescription, take 1 week's worth of pills and put them away in a safe place. You should keep a separate, properly labeled bottle for this purpose. The remainder should be kept in the original bottle. Use this as your primary supply, until it runs out. Once it's gone, then you know that you have 1 week's worth of medicine, and it is time to come in for a prescription refill. If you do this correctly, it is unlikely that you will ever run out of medicine. To make sure that the above recommendation works, it is very important that you make sure your medication refill appointments are scheduled at least 1 week before you run out of medicine. To do this in an effective manner, make sure that you do not leave the office without scheduling your next medication management appointment. Always ask the nursing staff to show you in your prescription , when your medication will be running out. Then arrange for the receptionist to get you a return appointment, at least 7 days before you run out of medicine. Do not wait until you have 1 or 2 pills left, to come in. This is very poor planning and does not take into consideration that we may need to cancel appointments due to bad weather, sickness, or emergencies affecting our staff. DO NOT ACCEPT A "Partial Fill": If for any reason your pharmacy does not have enough pills/tablets to completely fill or refill your prescription, do not allow for a "partial fill". The law allows the pharmacy to complete that prescription within 72 hours, without requiring a new prescription. If they do not fill the rest of your prescription  within those 72 hours, you will need a separate prescription to fill the remaining amount, which we will NOT provide. If  the reason for the partial fill is your insurance, you will need to talk to the pharmacist about payment alternatives for the remaining tablets, but again, DO NOT ACCEPT A PARTIAL FILL, unless you can trust your pharmacist to obtain the remainder of the pills within 72 hours.  Prescription refills and/or changes in medication(s):  Prescription refills, and/or changes in dose or medication, will be conducted only during scheduled medication management appointments. (Applies to both, written and electronic prescriptions.) No refills on procedure days. No medication will be changed or started on procedure days. No changes, adjustments, and/or refills will be conducted on a procedure day. Doing so will interfere with the diagnostic portion of the procedure. No phone refills. No medications will be "called into the pharmacy". No Fax refills. No weekend refills. No Holliday refills. No after hours refills.  Remember:  Business hours are:  Monday to Thursday 8:00 AM to 4:00 PM Provider's Schedule: Milinda Pointer, MD - Appointments are:  Medication management: Monday and Wednesday 8:00 AM to 4:00 PM Procedure day: Tuesday and Thursday 7:30 AM to 4:00 PM Gillis Santa, MD - Appointments are:  Medication management: Tuesday and Thursday 8:00 AM to 4:00 PM Procedure day: Monday and Wednesday 7:30 AM to 4:00 PM (Last update: 01/07/2020) ____________________________________________________________________________________________  ____________________________________________________________________________________________  Pharmacy Shortages of Pain Medication   Introduction Shockingly as it may seem, .  "No [place] Supreme Court decision has ever interpreted the Constitution as guaranteeing a right to health care for all Americans." -  https://huff.com/  "With respect to human rights, the [place] has no formally codified right to health, nor does it participate in a human rights treaty that specifies a right to health." - Scott J. Schweikart, JD, MBE  Situation By now, most of our patients have had the experience of being told by their pharmacist that they do not have enough medication to cover their prescription. If you have not had this experience, just know that you soon will.  Problem There appears to be a shortage of these medications, either at the national level or locally. This is happening with all pharmacies. When there is not enough medication, patients are offered a partial fill and they are told that they will try to get the rest of the medicine for them at a later time. If they do not have enough for even a partial fill, the pharmacists are telling the patients to call us (the prescribing physicians) to request that we send another prescription to another pharmacy to get the medicine.   This reordering of a controlled substance creates documentation problems where additional paperwork needs to be created to explain why two prescriptions for the same period of time and the same medicine are being prescribed to the same patient. It also creates situations where the last appointment note does not accurately reflect when and what prescriptions were given to a patient. This leads to prescribing errors down the line, in subsequent follow-up visits.   [employer] ([employer]) Research revealed that [employer]or, quantity .1806 (21 NCAC 46.1806) authorizes pharmacists to the transfer of prescriptions among pharmacies, and it sets forth procedural and recordkeeping requirements for doing so. However, this requires the pharmacist to complete the previously mentioned procedural paperwork to accomplish the transfer. As it turns out, it is much  easier for them to have the prescribing physicians do the work.   Possible solutions 1. You can ask your physician to assist you in weaning yourself off these medications. 2. Ask  your pharmacy if the medication is in stock, 3 days prior to your refill. 3. If you need a pharmacy change, let us know at your medication management visit. Prescriptions that have already been electronically sent to a pharmacy will not be re-sent to a different pharmacy if your pharmacy of record does not have it in stock. Proper stocking of medication is a pharmacy problem, not a prescriber problem. Work with your pharmacist to solve the problem. 4. Have the [facility] Assembly add a provision to the "STOP ACT" (the law that mandates how controlled substances are prescribed) where there is an exception to the electronic prescribing rule that states that in the event there are shortages of medications the physicians are allowed to use written prescriptions as opposed to electronic ones. This would allow patients to take their prescriptions to a different pharmacy that may have enough medication available to fill the prescription. The problem is that currently there is a law that does not allow for written prescriptions, with the exception of instances where the electronic medical record is down due to technical issues.  5. Have [place] Congress ease the pressure on pharmaceutical companies, allowing them to produce enough quantities of the medication to adequately supply the population. 6. Have pharmacies keep enough stocks of these medications to cover their client base.  7. Have the [facility] Assembly add a provision to the "STOP ACT" where they ease the regulations surrounding the transfer of controlled substances between pharmacies, so as to simplify the transfer of supplies. As an alternative, develop a system to allow patients to obtain the remainder of their prescription at another one of their pharmacies or  at an associate pharmacy.   How this shortage will affect you.  Understand that this is a pharmacy supply problem, not a prescriber problem. Work with your pharmacy to solve it. The job of the prescriber is to evaluate and monitor the patient for the appropriate indications and use of these medicines. It is not the job of the prescriber to supply the medication or to solve problems with that supply. The responsibility and the choice to obtain the medication resides on the patient. By law, supplying the medication is the job of the pharmacy. It is certainly not the job of the prescriber to solve supply problems.   Due to the above problems we are no longer taking patients to write for their pain medication. Future discussions with your physician may include potentially weaning medications or transitioning to alternatives.  We will be focusing primarily on interventional based pain management. We will continue to evaluate for appropriate indications and we may provide recommendations regarding medication, dose, and schedule, as well as monitoring recommendations, however, we will not be taking over the actual prescribing of these substances. On those patients where we are treating their chronic pain with interventional therapies, exceptions will be considered on a case by case basis. At this time, we will try to continue providing this supplemental service to those patients we have been managing in the past. However, as of August 1st, 2023, we no longer will be sending additional prescriptions to other pharmacies for the purpose of solving their supply problems. Once we send a prescription to a pharmacy, we will not be resending it again to another pharmacy to cover for their shortages.   What to do. Write as many letters as you can. Recruit the help of family members in writing these letters. Below are some of the places where you  can write to make your voice heard. Let them know what the problem is and push  them to look for solutions.   Search internet for: "[employer] find your legislators" NoseSwap.is  Search internet for: "[employer] commissioner complaints" Starlas.fi  Search internet for: "[place] complaints" https://www.hernandez-brewer.com/.htm  Search internet for: "[facility] pharmacy complaints" Email [facility] Customer [facility] woondaal.com.jsp?callType=store  Search internet for: Programme researcher, broadcasting/film/video customer service complaints" https://www.[facility].com/topic/marketing/contactus/contactus_customerservice.jsp  ____________________________________________________________________________________________  ____________________________________________________________________________________________  Drug Holidays (Slow)  What is a "Drug Holiday"? Drug Holiday: is the name given to the period of time during which a patient stops taking a medication(s) for the purpose of eliminating tolerance to the drug.  Benefits Improved effectiveness of opioids. Decreased opioid dose needed to achieve benefits. Improved pain with lesser dose.  What is tolerance? Tolerance: is the progressive decreased in effectiveness of a drug due to its repetitive use. With repetitive use, the body gets use to the medication and as a consequence, it loses its effectiveness. This is a common problem seen with opioid pain medications. As a result, a larger dose of the drug is needed to achieve the same effect that used to be obtained with a smaller dose.  How long should a "Drug Holiday" last? You should stay off of the pain medicine for at least 14 consecutive days. (2 weeks)  Should I stop the medicine "cold [place]"? No. You should always coordinate with your Pain Specialist so that he/she can provide you with the correct medication dose to make  the transition as smoothly as possible.  How do I stop the medicine? Slowly. You will be instructed to decrease the daily amount of pills that you take by one (1) pill every seven (7) days. This is called a "slow downward taper" of your dose. For example: if you normally take four (4) pills per day, you will be asked to drop this dose to three (3) pills per day for seven (7) days, then to two (2) pills per day for seven (7) days, then to one (1) per day for seven (7) days, and at the end of those last seven (7) days, this is when the "Drug Holiday" would start.   Will I have withdrawals? By doing a "slow downward taper" like this one, it is unlikely that you will experience any significant withdrawal symptoms. Typically, what triggers withdrawals is the sudden stop of a high dose opioid therapy. Withdrawals can usually be avoided by slowly decreasing the dose over a prolonged period of time. If you do not follow these instructions and decide to stop your medication abruptly, withdrawals may be possible.  What are withdrawals? Withdrawals: refers to the wide range of symptoms that occur after stopping or dramatically reducing opiate drugs after heavy and prolonged use. Withdrawal symptoms do not occur to patients that use low dose opioids, or those who take the medication sporadically. Contrary to benzodiazepine (example: Valium, Xanax, etc.) or alcohol withdrawals ("Delirium Tremens"), opioid withdrawals are not lethal. Withdrawals are the physical manifestation of the body getting rid of the excess receptors.  Expected Symptoms Early symptoms of withdrawal may include: Agitation Anxiety Muscle aches Increased tearing Insomnia Runny nose Sweating Yawning  Late symptoms of withdrawal may include: Abdominal cramping Diarrhea Dilated pupils Goose bumps Nausea Vomiting  Will I experience withdrawals? Due to the slow nature of the taper, it is very unlikely that you will experience  any.  What is a slow taper? Taper: refers to the gradual decrease in  dose.  (Last update: 01/07/2020) ____________________________________________________________________________________________

## 2022-03-29 NOTE — Progress Notes (Deleted)
Safety precautions to be maintained throughout the outpatient stay will include: orient to surroundings, keep bed in low position, maintain call bell within reach at all times, provide assistance with transfer out of bed and ambulation.  

## 2022-03-29 NOTE — Progress Notes (Signed)
Nursing Pain Medication Assessment:  Safety precautions to be maintained throughout the outpatient stay will include: orient to surroundings, keep bed in low position, maintain call bell within reach at all times, provide assistance with transfer out of bed and ambulation.  Medication Inspection Compliance:  patches  Medication: Buprenorphine (Suboxone) Pill/Patch Count:  1 of 4 pills remain Pill/Patch Appearance: No markings Bottle Appearance: Standard pharmacy container. Clearly labeled. Filled Date: 09 / 11 / 2023 Last Medication intake:   6 days ago

## 2022-04-03 ENCOUNTER — Other Ambulatory Visit: Payer: Self-pay | Admitting: Family Medicine

## 2022-04-03 DIAGNOSIS — I10 Essential (primary) hypertension: Secondary | ICD-10-CM

## 2022-04-03 DIAGNOSIS — R399 Unspecified symptoms and signs involving the genitourinary system: Secondary | ICD-10-CM

## 2022-04-03 DIAGNOSIS — R351 Nocturia: Secondary | ICD-10-CM

## 2022-04-03 NOTE — Telephone Encounter (Signed)
Medication Refill - Medication: tamsulosin (FLOMAX) 0.4 MG CAPS capsule  losartan (COZAAR) 50 MG tablet  Has the patient contacted their pharmacy? Yes.   (Agent: If no, request that the patient contact the pharmacy for the refill. If patient does not wish to contact the pharmacy document the reason why and proceed with request.) (Agent: If yes, when and what did the pharmacy advise?)  Preferred Pharmacy (with phone number or street name):  [place], [place] [facility] [address] [place] [address]  Phone: [phone] Fax: [phone]   Has the patient been seen for an appointment in the last year OR does the patient have an upcoming appointment? Yes.    Agent: Please be advised that RX refills may take up to 3 business days. We ask that you follow-up with your pharmacy.

## 2022-04-04 ENCOUNTER — Encounter: Payer: Medicare Other | Admitting: Nurse Practitioner

## 2022-04-04 MED ORDER — TAMSULOSIN HCL 0.4 MG PO CAPS: 0.4000 mg | ORAL_CAPSULE | Freq: Every day | ORAL | 1 refills | Status: DC

## 2022-04-04 MED ORDER — LOSARTAN POTASSIUM 50 MG PO TABS: 50.0000 mg | ORAL_TABLET | Freq: Every day | ORAL | 1 refills | Status: DC

## 2022-04-04 NOTE — Telephone Encounter (Signed)
Requested medication (s) are due for refill today: yes  Requested medication (s) are on the active medication list:yes  Last refill:  02/10/21 and 01/12/22  Future visit scheduled: yes  Notes to clinic:  Unable to refill per protocol, last refill by another provider. Historical medication, routing for review     Requested Prescriptions  Pending Prescriptions Disp Refills   losartan (COZAAR) 50 MG tablet      Sig: Take 1 tablet (50 mg total) by mouth daily.     Cardiovascular:  Angiotensin Receptor Blockers Failed - 04/03/2022 11:18 AM      Failed - Last BP in normal range    BP Readings from Last 1 Encounters:  03/29/22 (!) 149/83         Passed - Cr in normal range and within 180 days    Creatinine, Ser  Date Value Ref Range Status  03/15/2022 1.15 0.61 - 1.24 mg/dL Final   Creatinine, Urine  Date Value Ref Range Status  12/13/2019 631 mg/dL Final    Comment:    RESULTS CONFIRMED BY MANUAL DILUTION Performed at [facility], [address] [place], [place] [address]          Passed - K in normal range and within 180 days    Potassium  Date Value Ref Range Status  03/15/2022 4.1 3.5 - 5.1 mmol/L Final         Passed - Patient is not pregnant      Passed - Valid encounter within last 6 months    Recent Outpatient Visits           2 months ago Essential hypertension   [place], Devonne Doughty, DO   1 year ago Severe episode of recurrent major depressive disorder, with psychotic features ([place])   [place], DO   1 year ago Seasonal allergic rhinitis due to other allergic trigger   [facility] Olin Hauser, DO   1 year ago COPD with acute exacerbation [facility])   [facility] Olin Hauser, DO   1 year ago Upper respiratory tract infection, unspecified type   [place], NP       Future Appointments              Today Gusler, Christin Z, NP [facility]             tamsulosin (FLOMAX) 0.4 MG CAPS capsule 90 capsule 0    Sig: Take 1 capsule (0.4 mg total) by mouth daily.     Urology: Alpha-Adrenergic Blocker Failed - 04/03/2022 11:18 AM      Failed - PSA in normal range and within 360 days    No results found for: "LABPSA", "PSA", "PSA1", "ULTRAPSA"       Failed - Last BP in normal range    BP Readings from Last 1 Encounters:  03/29/22 (!) 149/83         Passed - Valid encounter within last 12 months    Recent Outpatient Visits           2 months ago Essential hypertension   [place], DO   1 year ago Severe episode of recurrent major depressive disorder, with psychotic features [facility])   [place], DO   1 year ago Seasonal allergic rhinitis due to other allergic trigger   [facility]  Olin Hauser, DO   1 year ago COPD with acute exacerbation [facility])   [place], DO   1 year ago Upper respiratory tract infection, unspecified type   [place], NP       Future Appointments             Today Gusler, Pauline Aus, NP [facility]

## 2022-04-05 ENCOUNTER — Telehealth: Payer: Self-pay | Admitting: *Deleted

## 2022-04-05 ENCOUNTER — Telehealth: Payer: Medicare Other | Admitting: Nurse Practitioner

## 2022-04-05 NOTE — Progress Notes (Signed)
This encounter was created in error - please disregard.

## 2022-04-05 NOTE — Telephone Encounter (Signed)
[place] [phone]  Calling to find out if Benign paroxysmal positional vertigo is left,right, bilateral.  Unable to find information quickly in chart notes- will send to office for confirmation and call back.

## 2022-04-06 NOTE — Telephone Encounter (Signed)
I reviewed previous record from 01/2022, it looks like the diagnosis is "Bilateral" for the BPPV Vertigo.  Thank you  [name], DO [facility] 04/06/2022, 4:33 PM

## 2022-04-10 ENCOUNTER — Telehealth (INDEPENDENT_AMBULATORY_CARE_PROVIDER_SITE_OTHER): Payer: Medicare Other | Admitting: Family Medicine

## 2022-04-10 ENCOUNTER — Encounter: Payer: Self-pay | Admitting: Family Medicine

## 2022-04-10 ENCOUNTER — Ambulatory Visit: Payer: Self-pay

## 2022-04-10 ENCOUNTER — Ambulatory Visit: Payer: Medicare Other | Admitting: Pain Medicine

## 2022-04-10 VITALS — Wt 225.0 lb

## 2022-04-10 DIAGNOSIS — G6289 Other specified polyneuropathies: Secondary | ICD-10-CM | POA: Diagnosis not present

## 2022-04-10 DIAGNOSIS — M545 Low back pain, unspecified: Secondary | ICD-10-CM | POA: Diagnosis not present

## 2022-04-10 DIAGNOSIS — G8929 Other chronic pain: Secondary | ICD-10-CM | POA: Diagnosis not present

## 2022-04-10 MED ORDER — GABAPENTIN 100 MG PO CAPS: ORAL_CAPSULE | ORAL | 1 refills | Status: DC

## 2022-04-10 NOTE — Progress Notes (Signed)
Virtual Visit via Telephone The purpose of this virtual visit is to provide medical care while limiting exposure to the novel coronavirus (COVID19) for both patient and office staff.  Consent was obtained for phone visit:  Yes.   Answered questions that patient had about telehealth interaction:  Yes.   I discussed the limitations, risks, security and privacy concerns of performing an evaluation and management service by telephone. I also discussed with the patient that there may be a patient responsible charge related to this service. The patient expressed understanding and agreed to proceed.  Patient Location: Home Provider Location: [facility] (Office)  Participants in virtual visit: - Patient: "Bill" [name] - CMA: Orinda Kenner, [place] - Provider: Dr Parks Ranger  ---------------------------------------------------------------------- Chief Complaint  Patient presents with   Foot Pain    S: Reviewed CMA documentation. I have called patient and gathered additional HPI as follows:  Peripheral Neuropathy Left Foot/Leg worse than Right Chronic problem Describes pins needles stinging sharp stabbing pain, seems to have waves of coming and going pain, when present it can be severe. Past on Gabapentin does not recall this as much He failed Lyrica years prior with muscle jerk symptmos  Denies any fevers, chills, sweats, body ache, sinus pain or pressure, headache, abdominal pain, diarrhea  Past Medical History:  Diagnosis Date   Anxiety    Cancer ([place])    skin   COPD (chronic obstructive pulmonary disease) ([place])    Glaucoma    Osteoporosis    osteopenia   Social History   Tobacco Use   Smoking status: Former    Years: 20.00    Types: Cigarettes   Smokeless tobacco: Current    Types: Chew   Tobacco comments:    stopped 15 years ago  Vaping Use   Vaping Use: Never used  Substance Use Topics   Alcohol use: Not Currently    Comment: past   Drug  use: Never    Current Outpatient Medications:    gabapentin (NEURONTIN) 100 MG capsule, Start 1 capsule daily, increase by 1 cap every 2-3 days as tolerated up to 3 times a day, or may take 3 at once in evening., Disp: 90 capsule, Rfl: 1   acetaminophen (TYLENOL) 500 MG tablet, Take 1,000 mg by mouth in the morning, at noon, and at bedtime., Disp: , Rfl:    amitriptyline (ELAVIL) 10 MG tablet, Take 1 tablet (10 mg total) by mouth at bedtime., Disp: 90 tablet, Rfl: 1   bumetanide (BUMEX) 1 MG tablet, Take 0.5 tablets (0.5 mg total) by mouth 2 (two) times daily., Disp: 90 tablet, Rfl: 1   buprenorphine (BUTRANS) 10 MCG/HR PTWK, Place 1 patch onto the skin once a week for 28 days. Apply only 1 patch at a time and alternate sites weekly., Disp: 4 patch, Rfl: 0   [START ON 04/26/2022] buprenorphine (BUTRANS) 10 MCG/HR PTWK, Place 1 patch onto the skin once a week for 28 days. Apply only 1 patch at a time and alternate sites weekly., Disp: 4 patch, Rfl: 0   [START ON 05/24/2022] buprenorphine (BUTRANS) 10 MCG/HR PTWK, Place 1 patch onto the skin once a week for 28 days. Apply only 1 patch at a time and alternate sites weekly., Disp: 4 patch, Rfl: 0   carvedilol (COREG) 12.5 MG tablet, Take 1 tablet (12.5 mg total) by mouth 2 (two) times daily., Disp: 180 tablet, Rfl: 1   cetirizine (ZYRTEC) 10 MG tablet, Take 10 mg by mouth daily., Disp: ,  Rfl:    COSOPT 22.3-6.8 MG/ML ophthalmic solution, Place 1 drop into both eyes 2 (two) times daily., Disp: , Rfl:    diclofenac Sodium (VOLTAREN) 1 % GEL, Apply 2 g topically at bedtime. Apply to both knees twice daily, Disp: 350 g, Rfl: 0   DULoxetine (CYMBALTA) 30 MG capsule, Take 90 mg by mouth daily., Disp: , Rfl:    Homeopathic Products (ARNICARE) GEL, Apply topically., Disp: , Rfl:    latanoprost (XALATAN) 0.005 % ophthalmic solution, Place 1 drop into both eyes at bedtime., Disp: , Rfl:    losartan (COZAAR) 50 MG tablet, Take 1 tablet (50 mg total) by mouth  daily., Disp: 90 tablet, Rfl: 1   Melatonin 5 MG CAPS, Take 1 capsule (5 mg total) by mouth at bedtime., Disp: , Rfl: 0   OLANZapine (ZYPREXA) 2.5 MG tablet, Take 1 tablet (2.5 mg total) by mouth 2 (two) times daily., Disp: 180 tablet, Rfl: 1   pantoprazole (PROTONIX) 40 MG tablet, Take 1 tablet (40 mg total) by mouth daily., Disp: 90 tablet, Rfl: 3   Propylene Glycol (SYSTANE BALANCE OP), Place 1 drop into the left eye daily., Disp: , Rfl:    tamsulosin (FLOMAX) 0.4 MG CAPS capsule, Take 1 capsule (0.4 mg total) by mouth daily., Disp: 90 capsule, Rfl: 1   traZODone (DESYREL) 150 MG tablet, Take 1 tablet (150 mg total) by mouth at bedtime as needed., Disp: 90 tablet, Rfl: 1     02/27/2022   11:33 AM 08/09/2020   12:44 PM 07/27/2020    3:30 PM  Depression screen PHQ 2/9  Decreased Interest 0 0 0  Down, Depressed, Hopeless 0 0 0  PHQ - 2 Score 0 0 0       03/05/2020    3:16 PM 05/27/2019    9:45 AM 07/12/2018   11:22 AM 05/01/2018    3:23 PM  GAD 7 : Generalized Anxiety Score  Nervous, Anxious, on Edge '3 2 1 '$ 0  Control/stop worrying '2 2 1 '$ 0  Worry too much - different things '2 2 1 '$ 0  Trouble relaxing '2 2 1 '$ 0  Restless 2 1 0 0  Easily annoyed or irritable 1 1 0 0  Afraid - awful might happen 3 0 1 0  Total GAD 7 Score '15 10 5 '$ 0  Anxiety Difficulty Somewhat difficult Somewhat difficult Somewhat difficult Not difficult at all    -------------------------------------------------------------------------- O: No physical exam performed due to remote telephone encounter.  Lab results reviewed.  Recent Results (from the past 2160 hour(s))  Comprehensive metabolic panel     Status: Abnormal   Collection Time: 01/22/22 10:53 AM  Result Value Ref Range   Sodium 141 135 - 145 mmol/L   Potassium 3.8 3.5 - 5.1 mmol/L   Chloride 114 (H) 98 - 111 mmol/L   CO2 21 (L) 22 - 32 mmol/L   Glucose, Bld 119 (H) 70 - 99 mg/dL    Comment: Glucose reference range applies only to samples taken after  fasting for at least 8 hours.   BUN 24 (H) 8 - 23 mg/dL   Creatinine, Ser 1.15 0.61 - 1.24 mg/dL   Calcium 9.1 8.9 - 10.3 mg/dL   Total Protein 6.7 6.5 - 8.1 g/dL   Albumin 3.7 3.5 - 5.0 g/dL   AST 21 15 - 41 U/L   ALT 14 0 - 44 U/L   Alkaline Phosphatase 64 38 - 126 U/L   Total Bilirubin 0.5 0.3 - 1.2  mg/dL   GFR, Estimated >60 >60 mL/min    Comment: (NOTE) Calculated using the CKD-EPI Creatinine Equation (2021)    Anion gap 6 5 - 15    Comment: Performed at [facility], [place], [place] [address]  Troponin I (High Sensitivity)     Status: None   Collection Time: 01/22/22 10:53 AM  Result Value Ref Range   Troponin I (High Sensitivity) 10 <18 ng/L    Comment: (NOTE) Elevated high sensitivity troponin I (hsTnI) values and significant  changes across serial measurements may suggest ACS but many other  chronic and acute conditions are known to elevate hsTnI results.  Refer to the "Links" section for chest pain algorithms and additional  guidance. Performed at [facility], [address] [place], [place] [address]   Lipase, blood     Status: None   Collection Time: 01/22/22 10:53 AM  Result Value Ref Range   Lipase 24 11 - 51 U/L    Comment: Performed at [facility], [address] [place], [place] [address]  Brain natriuretic peptide     Status: None   Collection Time: 01/22/22 10:53 AM  Result Value Ref Range   B Natriuretic Peptide 17.9 0.0 - 100.0 pg/mL    Comment: Performed at [facility], [address] [place], [place] [address]  Magnesium     Status: None   Collection Time: 01/22/22 10:53 AM  Result Value Ref Range   Magnesium 2.0 1.7 - 2.4 mg/dL    Comment: Performed at [facility], [address] [place], [place] [address]  TSH     Status: None   Collection Time: 01/22/22 10:53 AM  Result Value Ref Range   TSH 2.486 0.350 - 4.500 uIU/mL    Comment: Performed by a 3rd Generation assay with  a functional sensitivity of <=0.01 uIU/mL. Performed at [facility], [address] [place],  [address]   T4, free     Status: None   Collection Time: 01/22/22 10:53 AM  Result Value Ref Range   Free T4 0.97 0.61 - 1.12 ng/dL    Comment: (NOTE) Biotin ingestion may interfere with free T4 tests. If the results are inconsistent with the TSH level, previous test results, or the clinical presentation, then consider biotin interference. If needed, order repeat testing after stopping biotin. Performed at [facility], [address] [place],  [address]   SARS Coronavirus 2 by RT PCR (hospital order, performed in [facility] hospital lab) *cepheid single result test* Anterior Nasal Swab     Status: None   Collection Time: 01/22/22 11:44 AM   Specimen: Anterior Nasal Swab  Result Value Ref Range   SARS Coronavirus 2 by RT PCR NEGATIVE NEGATIVE    Comment: (NOTE) SARS-CoV-2 target nucleic acids are NOT DETECTED.  The SARS-CoV-2 RNA is generally detectable in upper and lower respiratory specimens during the acute phase of infection. The lowest concentration of SARS-CoV-2 viral copies this assay can detect is 250 copies / mL. A negative result does not preclude SARS-CoV-2 infection and should not be used as the sole basis for treatment or other patient management decisions.  A negative result may occur with improper specimen collection / handling, submission of specimen other than nasopharyngeal swab, presence of viral mutation(s) within the areas targeted by this assay, and inadequate number of viral copies (<250 copies / mL). A negative result must be combined with clinical observations, patient history, and epidemiological  information.  Fact Sheet for Patients:   https://www.patel.info/  Fact Sheet for Healthcare Providers: https://hall.com/  This test is not yet approved or  cleared by the [place]  FDA and has been authorized for detection and/or diagnosis of SARS-CoV-2 by FDA under an Emergency Use Authorization (EUA).  This EUA will remain in effect (meaning this test can be used) for the duration of the COVID-19 declaration under Section 564(b)(1) of the Act, 21 U.S.C. section 360bbb-3(b)(1), unless the authorization is terminated or revoked sooner.  Performed at [facility], [address] [place], [place] [address]   CBC with Differential/Platelet     Status: Abnormal   Collection Time: 01/22/22 11:44 AM  Result Value Ref Range   WBC 3.4 (L) 4.0 - 10.5 K/uL   RBC 4.23 4.22 - 5.81 MIL/uL   Hemoglobin 10.8 (L) 13.0 - 17.0 g/dL   HCT 36.1 (L) 39.0 - 52.0 %   MCV 85.3 80.0 - 100.0 fL   MCH 25.5 (L) 26.0 - 34.0 pg   MCHC 29.9 (L) 30.0 - 36.0 g/dL   RDW 15.9 (H) 11.5 - 15.5 %   Platelets 155 150 - 400 K/uL   nRBC 0.0 0.0 - 0.2 %   Neutrophils Relative % 40 %   Neutro Abs 1.3 (L) 1.7 - 7.7 K/uL   Lymphocytes Relative 30 %   Lymphs Abs 1.0 0.7 - 4.0 K/uL   Monocytes Relative 22 %   Monocytes Absolute 0.7 0.1 - 1.0 K/uL   Eosinophils Relative 7 %   Eosinophils Absolute 0.2 0.0 - 0.5 K/uL   Basophils Relative 1 %   Basophils Absolute 0.0 0.0 - 0.1 K/uL   Immature Granulocytes 0 %   Abs Immature Granulocytes 0.01 0.00 - 0.07 K/uL    Comment: Performed at [facility], [address] [place], [place] [address]  Urinalysis, Routine w reflex microscopic     Status: Abnormal   Collection Time: 01/22/22  1:06 PM  Result Value Ref Range   Color, Urine YELLOW (A) YELLOW   APPearance HAZY (A) CLEAR   Specific Gravity, Urine 1.019 1.005 - 1.030   pH 5.0 5.0 - 8.0   Glucose, UA NEGATIVE NEGATIVE mg/dL   Hgb urine dipstick NEGATIVE NEGATIVE   Bilirubin Urine NEGATIVE NEGATIVE   Ketones, ur NEGATIVE NEGATIVE mg/dL   Protein, ur 30 (A) NEGATIVE mg/dL   Nitrite NEGATIVE NEGATIVE   Leukocytes,Ua MODERATE (A) NEGATIVE   RBC / HPF 6-10 0 - 5 RBC/hpf   WBC, UA  11-20 0 - 5 WBC/hpf   Bacteria, UA NONE SEEN NONE SEEN   Squamous Epithelial / LPF 0-5 0 - 5   Mucus PRESENT    Hyaline Casts, UA PRESENT     Comment: Performed at [facility], [address] [place], [place] [address]  Urine Culture     Status: Abnormal   Collection Time: 01/22/22  1:48 PM   Specimen: Urine, Random  Result Value Ref Range   Specimen Description      URINE, RANDOM Performed at [facility], [address], [place],  [address]    Special Requests      NONE Performed at [facility], [address] [place], [place] [address]    Culture 50,000 COLONIES/mL ESCHERICHIA COLI (A)    Report Status 01/24/2022 FINAL    Organism ID, Bacteria ESCHERICHIA COLI (A)       Susceptibility   Escherichia coli - MIC*    AMPICILLIN <=  2 SENSITIVE Sensitive     CEFAZOLIN <=4 SENSITIVE Sensitive     CEFEPIME <=0.12 SENSITIVE Sensitive     CEFTRIAXONE <=0.25 SENSITIVE Sensitive     CIPROFLOXACIN >=4 RESISTANT Resistant     GENTAMICIN <=1 SENSITIVE Sensitive     IMIPENEM <=0.25 SENSITIVE Sensitive     NITROFURANTOIN <=16 SENSITIVE Sensitive     TRIMETH/SULFA <=20 SENSITIVE Sensitive     AMPICILLIN/SULBACTAM <=2 SENSITIVE Sensitive     PIP/TAZO <=4 SENSITIVE Sensitive     * 50,000 COLONIES/mL ESCHERICHIA COLI  Troponin I (High Sensitivity)     Status: None   Collection Time: 01/22/22  1:58 PM  Result Value Ref Range   Troponin I (High Sensitivity) 9 <18 ng/L    Comment: (NOTE) Elevated high sensitivity troponin I (hsTnI) values and significant  changes across serial measurements may suggest ACS but many other  chronic and acute conditions are known to elevate hsTnI results.  Refer to the "Links" section for chest pain algorithms and additional  guidance. Performed at [facility], [address] [place], [place] [address]   Urinalysis, Routine w reflex microscopic Urine, Clean Catch     Status: Abnormal   Collection Time:  01/29/22 10:40 AM  Result Value Ref Range   Color, Urine YELLOW (A) YELLOW   APPearance CLEAR (A) CLEAR   Specific Gravity, Urine 1.012 1.005 - 1.030   pH 5.0 5.0 - 8.0   Glucose, UA NEGATIVE NEGATIVE mg/dL   Hgb urine dipstick NEGATIVE NEGATIVE   Bilirubin Urine NEGATIVE NEGATIVE   Ketones, ur NEGATIVE NEGATIVE mg/dL   Protein, ur NEGATIVE NEGATIVE mg/dL   Nitrite NEGATIVE NEGATIVE   Leukocytes,Ua NEGATIVE NEGATIVE    Comment: Performed at [facility], [name] [place], [place] [address]  CBC with Differential     Status: Abnormal   Collection Time: 01/29/22 11:25 AM  Result Value Ref Range   WBC 4.9 4.0 - 10.5 K/uL   RBC 4.18 (L) 4.22 - 5.81 MIL/uL   Hemoglobin 10.5 (L) 13.0 - 17.0 g/dL   HCT 35.6 (L) 39.0 - 52.0 %   MCV 85.2 80.0 - 100.0 fL   MCH 25.1 (L) 26.0 - 34.0 pg   MCHC 29.5 (L) 30.0 - 36.0 g/dL   RDW 15.9 (H) 11.5 - 15.5 %   Platelets 106 (L) 150 - 400 K/uL   nRBC 0.0 0.0 - 0.2 %   Neutrophils Relative % 57 %   Neutro Abs 2.8 1.7 - 7.7 K/uL   Lymphocytes Relative 18 %   Lymphs Abs 0.9 0.7 - 4.0 K/uL   Monocytes Relative 21 %   Monocytes Absolute 1.0 0.1 - 1.0 K/uL   Eosinophils Relative 4 %   Eosinophils Absolute 0.2 0.0 - 0.5 K/uL   Basophils Relative 0 %   Basophils Absolute 0.0 0.0 - 0.1 K/uL   Immature Granulocytes 0 %   Abs Immature Granulocytes 0.02 0.00 - 0.07 K/uL    Comment: Performed at [facility], [address] [place], [place] [address]  Comprehensive metabolic panel     Status: Abnormal   Collection Time: 01/29/22 11:25 AM  Result Value Ref Range   Sodium 140 135 - 145 mmol/L   Potassium 4.4 3.5 - 5.1 mmol/L   Chloride 110 98 - 111 mmol/L   CO2 24 22 - 32 mmol/L   Glucose, Bld 133 (H) 70 - 99 mg/dL    Comment: Glucose reference range applies only to samples taken after fasting  for at least 8 hours.   BUN 23 8 - 23 mg/dL   Creatinine, Ser 1.16 0.61 - 1.24 mg/dL   Calcium 9.0 8.9 - 10.3 mg/dL   Total Protein 6.6  6.5 - 8.1 g/dL   Albumin 3.5 3.5 - 5.0 g/dL   AST 25 15 - 41 U/L   ALT 12 0 - 44 U/L   Alkaline Phosphatase 66 38 - 126 U/L   Total Bilirubin 0.6 0.3 - 1.2 mg/dL   GFR, Estimated >60 >60 mL/min    Comment: (NOTE) Calculated using the CKD-EPI Creatinine Equation (2021)    Anion gap 6 5 - 15    Comment: Performed at [facility], [place], [place] [address]  Troponin I (High Sensitivity)     Status: None   Collection Time: 01/29/22 11:25 AM  Result Value Ref Range   Troponin I (High Sensitivity) 7 <18 ng/L    Comment: (NOTE) Elevated high sensitivity troponin I (hsTnI) values and significant  changes across serial measurements may suggest ACS but many other  chronic and acute conditions are known to elevate hsTnI results.  Refer to the "Links" section for chest pain algorithms and additional  guidance. Performed at [facility], [place], [place] [address]   Troponin I (High Sensitivity)     Status: None   Collection Time: 01/29/22  1:10 PM  Result Value Ref Range   Troponin I (High Sensitivity) 7 <18 ng/L    Comment: (NOTE) Elevated high sensitivity troponin I (hsTnI) values and significant  changes across serial measurements may suggest ACS but many other  chronic and acute conditions are known to elevate hsTnI results.  Refer to the "Links" section for chest pain algorithms and additional  guidance. Performed at [facility], [address] [place], [place] [address]   Procalcitonin - Baseline     Status: None   Collection Time: 01/29/22  1:12 PM  Result Value Ref Range   Procalcitonin <0.10 ng/mL    Comment:        Interpretation: PCT (Procalcitonin) <= 0.5 ng/mL: Systemic infection (sepsis) is not likely. Local bacterial infection is possible. (NOTE)       Sepsis PCT Algorithm           Lower Respiratory Tract                                      Infection PCT Algorithm    ----------------------------      ----------------------------         PCT < 0.25 ng/mL                PCT < 0.10 ng/mL          Strongly encourage             Strongly discourage   discontinuation of antibiotics    initiation of antibiotics    ----------------------------     -----------------------------       PCT 0.25 - 0.50 ng/mL            PCT 0.10 - 0.25 ng/mL               OR       >80% decrease in PCT            Discourage initiation of  antibiotics      Encourage discontinuation           of antibiotics    ----------------------------     -----------------------------         PCT >= 0.50 ng/mL              PCT 0.26 - 0.50 ng/mL               AND        <80% decrease in PCT             Encourage initiation of                                             antibiotics       Encourage continuation           of antibiotics    ----------------------------     -----------------------------        PCT >= 0.50 ng/mL                  PCT > 0.50 ng/mL               AND         increase in PCT                  Strongly encourage                                      initiation of antibiotics    Strongly encourage escalation           of antibiotics                                     -----------------------------                                           PCT <= 0.25 ng/mL                                                 OR                                        > 80% decrease in PCT                                      Discontinue / Do not initiate                                             antibiotics  Performed at [facility], [address] [place], [place] [address]   Lactic acid, plasma     Status: None   Collection Time:  01/29/22  1:18 PM  Result Value Ref Range   Lactic Acid, Venous 1.3 0.5 - 1.9 mmol/L    Comment: Performed at [facility], [address] [place], [place] [address]  SARS Coronavirus 2 by RT PCR (hospital order, performed in  [facility] hospital lab) *cepheid single result test* Anterior Nasal Swab     Status: None   Collection Time: 01/29/22  1:22 PM   Specimen: Anterior Nasal Swab  Result Value Ref Range   SARS Coronavirus 2 by RT PCR NEGATIVE NEGATIVE    Comment: (NOTE) SARS-CoV-2 target nucleic acids are NOT DETECTED.  The SARS-CoV-2 RNA is generally detectable in upper and lower respiratory specimens during the acute phase of infection. The lowest concentration of SARS-CoV-2 viral copies this assay can detect is 250 copies / mL. A negative result does not preclude SARS-CoV-2 infection and should not be used as the sole basis for treatment or other patient management decisions.  A negative result may occur with improper specimen collection / handling, submission of specimen other than nasopharyngeal swab, presence of viral mutation(s) within the areas targeted by this assay, and inadequate number of viral copies (<250 copies / mL). A negative result must be combined with clinical observations, patient history, and epidemiological information.  Fact Sheet for Patients:   https://www.patel.info/  Fact Sheet for Healthcare Providers: https://hall.com/  This test is not yet approved or  cleared by the [place] FDA and has been authorized for detection and/or diagnosis of SARS-CoV-2 by FDA under an Emergency Use Authorization (EUA).  This EUA will remain in effect (meaning this test can be used) for the duration of the COVID-19 declaration under Section 564(b)(1) of the Act, 21 U.S.C. section 360bbb-3(b)(1), unless the authorization is terminated or revoked sooner.  Performed at [facility], [place], [place] [address]   Respiratory (~20 pathogens) panel by PCR     Status: None   Collection Time: 01/29/22  7:39 PM   Specimen: Nasopharyngeal Swab; Respiratory  Result Value Ref Range   Adenovirus NOT DETECTED NOT DETECTED    Coronavirus 229E NOT DETECTED NOT DETECTED    Comment: (NOTE) The Coronavirus on the Respiratory Panel, DOES NOT test for the novel  Coronavirus (2019 nCoV)    Coronavirus HKU1 NOT DETECTED NOT DETECTED   Coronavirus NL63 NOT DETECTED NOT DETECTED   Coronavirus OC43 NOT DETECTED NOT DETECTED   Metapneumovirus NOT DETECTED NOT DETECTED   Rhinovirus / Enterovirus NOT DETECTED NOT DETECTED   Influenza A NOT DETECTED NOT DETECTED   Influenza B NOT DETECTED NOT DETECTED   Parainfluenza Virus 1 NOT DETECTED NOT DETECTED   Parainfluenza Virus 2 NOT DETECTED NOT DETECTED   Parainfluenza Virus 3 NOT DETECTED NOT DETECTED   Parainfluenza Virus 4 NOT DETECTED NOT DETECTED   Respiratory Syncytial Virus NOT DETECTED NOT DETECTED   Bordetella pertussis NOT DETECTED NOT DETECTED   Bordetella Parapertussis NOT DETECTED NOT DETECTED   Chlamydophila pneumoniae NOT DETECTED NOT DETECTED   Mycoplasma pneumoniae NOT DETECTED NOT DETECTED    Comment: Performed at [facility], [place] [address], [place], [place] [address]  Lactic acid, plasma     Status: None   Collection Time: 01/29/22  7:39 PM  Result Value Ref Range   Lactic Acid, Venous 0.9 0.5 - 1.9 mmol/L    Comment: Performed at [facility], [address] [place],  [address]  CBC     Status: Abnormal   Collection Time: 01/30/22  4:47 AM  Result Value Ref Range   WBC 3.5 (L) 4.0 - 10.5 K/uL   RBC 3.91 (L) 4.22 - 5.81 MIL/uL   Hemoglobin 9.9 (L) 13.0 - 17.0 g/dL   HCT 32.7 (L) 39.0 - 52.0 %   MCV 83.6 80.0 - 100.0 fL   MCH 25.3 (L) 26.0 - 34.0 pg   MCHC 30.3 30.0 - 36.0 g/dL   RDW 16.0 (H) 11.5 - 15.5 %   Platelets 119 (L) 150 - 400 K/uL   nRBC 0.0 0.0 - 0.2 %    Comment: Performed at [facility], [address], [place], [place] [address]  Basic metabolic panel     Status: Abnormal   Collection Time: 01/30/22  4:47 AM  Result Value Ref Range   Sodium 139 135 - 145 mmol/L   Potassium 3.6 3.5 - 5.1 mmol/L    Chloride 108 98 - 111 mmol/L   CO2 25 22 - 32 mmol/L   Glucose, Bld 98 70 - 99 mg/dL    Comment: Glucose reference range applies only to samples taken after fasting for at least 8 hours.   BUN 24 (H) 8 - 23 mg/dL   Creatinine, Ser 1.20 0.61 - 1.24 mg/dL   Calcium 8.9 8.9 - 10.3 mg/dL   GFR, Estimated 60 (L) >60 mL/min    Comment: (NOTE) Calculated using the CKD-EPI Creatinine Equation (2021)    Anion gap 6 5 - 15    Comment: Performed at [facility], [address] [place], [place] [address]  Vitamin B12     Status: None   Collection Time: 01/30/22  4:47 AM  Result Value Ref Range   Vitamin B-12 507 180 - 914 pg/mL    Comment: (NOTE) This assay is not validated for testing neonatal or myeloproliferative syndrome specimens for Vitamin B12 levels. Performed at [facility], [place] [address] [place], [place] [address]   Drug Screen 10 W/Conf, Serum     Status: None   Collection Time: 02/27/22  2:56 PM  Result Value Ref Range   Amphetamines, IA Negative Cutoff:50 ng/mL   Barbiturates, IA Negative Cutoff:0.1 ug/mL   Benzodiazepines, IA Negative Cutoff:20 ng/mL   Cocaine & Metabolite, IA Negative Cutoff:25 ng/mL   Phencyclidine, IA Negative Cutoff:8 ng/mL   THC(Marijuana) Metabolite, IA Negative Cutoff:5 ng/mL   Opiates, IA Negative Cutoff:5 ng/mL   Oxycodones, IA Negative Cutoff:5 ng/mL   Methadone, IA Negative Cutoff:25 ng/mL   Propoxyphene, IA Negative Cutoff:50 ng/mL    Comment: This test was developed and its performance characteristics determined by [facility].  It has not been cleared or approved by the Food and Drug Administration.   CBC     Status: Abnormal   Collection Time: 03/15/22 11:56 AM  Result Value Ref Range   WBC 3.3 (L) 4.0 - 10.5 K/uL   RBC 4.20 (L) 4.22 - 5.81 MIL/uL   Hemoglobin 10.1 (L) 13.0 - 17.0 g/dL   HCT 34.4 (L) 39.0 - 52.0 %   MCV 81.9 80.0 - 100.0 fL   MCH 24.0 (L) 26.0 - 34.0 pg   MCHC 29.4 (L) 30.0 - 36.0 g/dL   RDW 17.2  (H) 11.5 - 15.5 %   Platelets 118 (L) 150 - 400 K/uL    Comment: REPEATED TO VERIFY   nRBC 0.0 0.0 - 0.2 %    Comment: Performed at [facility], [address], [place], [place] [address]  Comprehensive metabolic panel     Status:  Abnormal   Collection Time: 03/15/22 11:56 AM  Result Value Ref Range   Sodium 143 135 - 145 mmol/L   Potassium 4.1 3.5 - 5.1 mmol/L   Chloride 111 98 - 111 mmol/L   CO2 24 22 - 32 mmol/L   Glucose, Bld 127 (H) 70 - 99 mg/dL    Comment: Glucose reference range applies only to samples taken after fasting for at least 8 hours.   BUN 17 8 - 23 mg/dL   Creatinine, Ser 1.15 0.61 - 1.24 mg/dL   Calcium 8.8 (L) 8.9 - 10.3 mg/dL   Total Protein 6.8 6.5 - 8.1 g/dL   Albumin 3.7 3.5 - 5.0 g/dL   AST 22 15 - 41 U/L   ALT 11 0 - 44 U/L   Alkaline Phosphatase 64 38 - 126 U/L   Total Bilirubin 0.6 0.3 - 1.2 mg/dL   GFR, Estimated >60 >60 mL/min    Comment: (NOTE) Calculated using the CKD-EPI Creatinine Equation (2021)    Anion gap 8 5 - 15    Comment: Performed at [facility], [place], [place] [address]  Troponin I (High Sensitivity)     Status: None   Collection Time: 03/15/22 11:56 AM  Result Value Ref Range   Troponin I (High Sensitivity) 8 <18 ng/L    Comment: (NOTE) Elevated high sensitivity troponin I (hsTnI) values and significant  changes across serial measurements may suggest ACS but many other  chronic and acute conditions are known to elevate hsTnI results.  Refer to the "Links" section for chest pain algorithms and additional  guidance. Performed at [facility], [address] [place], [place] [address]     -------------------------------------------------------------------------- A&P:  Problem List Items Addressed This Visit   None Visit Diagnoses     Other polyneuropathy    -  Primary   Relevant Medications   gabapentin (NEURONTIN) 100 MG capsule      Polyneuropathy lower  extremities Chronic OA/DDD Low Back Pain history of sciatica Can have multifactorial cause of his symptoms Past history on Gabapentin briefly off med later He failed Lyrica due to muscle jerking  He agrees to re-try Gabapentin slow dose titration gradual course. New rx sent to pharmacy. Caution sedation gradual dose increase Follow up with questions  Return for vaccines  Meds ordered this encounter  Medications   gabapentin (NEURONTIN) 100 MG capsule    Sig: Start 1 capsule daily, increase by 1 cap every 2-3 days as tolerated up to 3 times a day, or may take 3 at once in evening.    Dispense:  90 capsule    Refill:  1    Follow-up: - Return anytime for Prevnar-20 and Flu Shot  Patient verbalizes understanding with the above medical recommendations including the limitation of remote medical advice.  Specific follow-up and call-back criteria were given for patient to follow-up or seek medical care more urgently if needed.   - Time spent in direct consultation with patient on phone: 7 minutes   [name], [facility] 04/10/2022, 4:48 PM

## 2022-04-10 NOTE — Telephone Encounter (Signed)
  Chief Complaint: Foot/leg pain Symptoms: as above Frequency: Long time recently more painful Pertinent Negatives: Patient denies  Disposition: '[]'$ ED /'[]'$ Urgent Care (no appt availability in office) / '[x]'$ Appointment(In office/virtual)/ '[]'$  [place] [facility]/ '[]'$ Home Care/ '[]'$ Refused Recommended Disposition /'[]'$ [place] [facility]/ '[]'$  Follow-up with PCP Additional Notes: Pt has VV this afternoon.     Reason for Disposition  [1] MODERATE pain (e.g., interferes with normal activities, limping) AND [2] present > 3 days  Answer Assessment - Initial Assessment Questions 1. ONSET: "When did the pain start?"      Long time 2. LOCATION: "Where is the pain located?"      Both time 3. PAIN: "How bad is the pain?"    (Scale 1-10; or mild, moderate, severe)  - MILD (1-3): doesn't interfere with normal activities.   - MODERATE (4-7): interferes with normal activities (e.g., work or school) or awakens from sleep, limping.   - SEVERE (8-10): excruciating pain, unable to do any normal activities, unable to walk.      7/10 4. WORK OR EXERCISE: "Has there been any recent work or exercise that involved this part of the body?"       5. CAUSE: "What do you think is causing the foot pain?"     Neuropathy 6. OTHER SYMPTOMS: "Do you have any other symptoms?" (e.g., leg pain, rash, fever, numbness)      7. PREGNANCY: "Is there any chance you are pregnant?" "When was your last menstrual period?"     na  Protocols used: Foot Pain-A-AH

## 2022-04-13 ENCOUNTER — Other Ambulatory Visit: Payer: Medicare Other

## 2022-04-13 VITALS — BP 180/82 | HR 65

## 2022-04-13 DIAGNOSIS — Z515 Encounter for palliative care: Secondary | ICD-10-CM

## 2022-04-13 NOTE — Progress Notes (Signed)
PATIENT NAME: Aaron Gibbs DOB: 12/21/1934 MRN: [id number]  PRIMARY CARE PROVIDER: Olin Hauser, DO  RESPONSIBLE PARTY:  Acct ID - Guarantor Home Phone Work Phone Relationship Acct Type  [id number] [name], [name]  Self P/F     [address] [place], [place] [address]    Appetite:  Patient endorses eating well.  Enjoys ice cream multiple times a day.   Patient states he will be cutting back on this due to weight gain.  Taking Equate Supplement as needed when appetite is poor.   HTN:  Blood pressure 180/82 on arrival.  Recheck and down to 162/88.  Carvedilol 12.5 mg and Losartan 50 mg being administered daily at 8 am per Aaron Gibbs-caregiver.  Wrist cuff bp machine in the home now.  Requested daily bp checks.  Caregiver will re-check this afternoon.  Dyspnea:  Patient with pursed lip breathing while talking.  Does not feel any more short of breath.  Not using inhalers at this time.   No home oxygen in place.  Neuropathy:  Recently started on Gabapentin for neuropathy left >right foot.  Patient feels pain has improved since the start of new medications.  Resources:  Discussed Meals on Wheels.  Patient previously received services when he lived in [place] there may be wait list for [facility] but would like to be added to the list and also have caregiver Aaron Gibbs be the point of contact.  Request sent to Aaron Gibbs, SW for [facility].  Discussed [employer].  Between [employer] served for about 4 years but not during active war time.  Uncertain if he has discharge papers from service and unable to recall what years he served.  Will have Palliative Care SW follow up with patient next week on [place] services.   Home Health Services: ended last week.  PPS: 50%   PHYSICAL EXAM:   VITALS: Today's Vitals   04/13/22 0911  BP: (!) 180/82  Pulse: 65  SpO2: 96%    LUNGS: clear to auscultation  CARDIAC: Cor RRR}  EXTREMITIES: - for edema SKIN: Skin color, texture,  turgor normal. No rashes or lesions or normal  NEURO: positive for gait problems       Aaron Burton, RN

## 2022-04-14 ENCOUNTER — Ambulatory Visit: Payer: Self-pay | Admitting: *Deleted

## 2022-04-14 NOTE — Telephone Encounter (Signed)
Answer Assessment - Initial Assessment Questions 1. LOCATION: "Where does it hurt?"      I called his [name] Roserio the caregiver.    Pt has sent me to the store to get something for his stomach.    He is constipated or something.   He has Pepto Bismal and Imodium at the house.  2 nurses have come by and checked on him. Pt has been yelling and screaming at me.   Bill screamed and yelled at the nurse when she came by wanting a prescription for the rash on his stomach.   The nurse said he had too much clothes on   He did not need a prescription. He does not need medication.    He wanted me to call you.   I'm just calling to say I called you.   Because if he calls your office and asked if I called you I don't want them to say no I didn't call.  "There is nothing wrong with him"   "He yelled and screamed at the nurse that came by earlier about everything so bad that she left".    Charlie denied that the pt was having diarrhea, stomach pain, constipation.   "He just sent me to the store to get him stuff".   "He eats a gallon of ice cream every 2 days, no kidding".   "He is constantly sending me to the store to get him all this stuff he doesn't need".    I asked [name] if he was sick or needed anything from me.   Charlie told me he was calling me so he could go back in the house, he is in his car, and tell [facility] that he called the dr.  Nothing more was needed and no triage necessary.   [name] thanked me for calling him back. 2. RADIATION: "Does the pain shoot anywhere else?" (e.g., chest, back)     N/A 3. ONSET: "When did the pain begin?" (Minutes, hours or days ago)      N/A 4. SUDDEN: "Gradual or sudden onset?"     N/A 5. PATTERN "Does the pain come and go, or is it constant?"    - If it comes and goes: "How long does it last?" "Do you have pain now?"     (Note: Comes and goes means the pain is intermittent. It goes away completely between bouts.)    - If constant: "Is it getting better, staying  the same, or getting worse?"      (Note: Constant means the pain never goes away completely; most serious pain is constant and gets worse.)      N/A 6. SEVERITY: "How bad is the pain?"  (e.g., Scale 1-10; mild, moderate, or severe)    - MILD (1-3): Doesn't interfere with normal activities, abdomen soft and not tender to touch.     - MODERATE (4-7): Interferes with normal activities or awakens from sleep, abdomen tender to touch.     - SEVERE (8-10): Excruciating pain, doubled over, unable to do any normal activities.       N/A 7. RECURRENT SYMPTOM: "Have you ever had this type of stomach pain before?" If Yes, ask: "When was the last time?" and "What happened that time?"      N/A 8. CAUSE: "What do you think is causing the stomach pain?"     N/A 9. RELIEVING/AGGRAVATING FACTORS: "What makes it better or worse?" (e.g., antacids, bending or twisting motion, bowel movement)  N/A 10. OTHER SYMPTOMS: "Do you have any other symptoms?" (e.g., back pain, diarrhea, fever, urination pain, vomiting)       NA  Protocols used: Abdominal Pain - Male-A-AH  Chief Complaint: I returned call to [name], caregiver for the pt. Bill.   See notes.   Charlie called the dr. Edythe Clarity appease Bill because he was yelling and screaming. Symptoms: No symptoms from Bill Frequency: N/A Pertinent Negatives: Patient denies diarrhea, abd pain, or constipation per Charlie.  Disposition: '[]'$ ED /'[]'$ Urgent Care (no appt availability in office) / '[]'$ Appointment(In office/virtual)/ '[]'$  [place] [facility]/ '[x]'$ Home Care/ '[]'$ Refused Recommended Disposition /'[]'$ [place] [facility]/ '[]'$  Follow-up with PCP Additional Notes: No intervention necessary.

## 2022-04-14 NOTE — Telephone Encounter (Signed)
Summary: rx adivce / stoamch discomfort   The patient's caregiver has called to request guidance on what the patient can take to help with their stomach discomfort   The patient's caregiver [name] would like to speak with a member of clinical staff when possible   Please contact further when possible      Called patient to review abdominal sx. On [phone]. No answer, LVMTCB [phone].

## 2022-04-14 NOTE — Telephone Encounter (Signed)
2nd call attempted to reach patient / Aaron Gibbs regarding patient's sx of abdominal discomfort. No answer, LVMTCB [phone].

## 2022-04-18 ENCOUNTER — Encounter: Payer: Self-pay | Admitting: Pain Medicine

## 2022-04-18 ENCOUNTER — Ambulatory Visit: Payer: Medicare Other | Attending: Pain Medicine | Admitting: Pain Medicine

## 2022-04-18 ENCOUNTER — Ambulatory Visit
Admission: RE | Admit: 2022-04-18 | Discharge: 2022-04-18 | Disposition: A | Discharge status: Home / Self Care | Payer: Medicare Other | Source: Ambulatory Visit | Attending: Pain Medicine | Admitting: Pain Medicine

## 2022-04-18 ENCOUNTER — Telehealth: Payer: Self-pay | Admitting: Family Medicine

## 2022-04-18 VITALS — BP 164/79 | HR 71 | Temp 97.1°F | Resp 16 | Ht 70.0 in | Wt 220.0 lb

## 2022-04-18 DIAGNOSIS — M1712 Unilateral primary osteoarthritis, left knee: Secondary | ICD-10-CM | POA: Diagnosis present

## 2022-04-18 DIAGNOSIS — M7650 Patellar tendinitis, unspecified knee: Secondary | ICD-10-CM | POA: Diagnosis present

## 2022-04-18 DIAGNOSIS — G8918 Other acute postprocedural pain: Secondary | ICD-10-CM | POA: Diagnosis present

## 2022-04-18 DIAGNOSIS — N401 Enlarged prostate with lower urinary tract symptoms: Secondary | ICD-10-CM

## 2022-04-18 DIAGNOSIS — S83282S Other tear of lateral meniscus, current injury, left knee, sequela: Secondary | ICD-10-CM | POA: Diagnosis present

## 2022-04-18 DIAGNOSIS — M25561 Pain in right knee: Secondary | ICD-10-CM | POA: Diagnosis present

## 2022-04-18 DIAGNOSIS — M170 Bilateral primary osteoarthritis of knee: Secondary | ICD-10-CM | POA: Insufficient documentation

## 2022-04-18 DIAGNOSIS — M25562 Pain in left knee: Secondary | ICD-10-CM | POA: Insufficient documentation

## 2022-04-18 DIAGNOSIS — S83242S Other tear of medial meniscus, current injury, left knee, sequela: Secondary | ICD-10-CM | POA: Diagnosis present

## 2022-04-18 DIAGNOSIS — M1711 Unilateral primary osteoarthritis, right knee: Secondary | ICD-10-CM | POA: Diagnosis present

## 2022-04-18 DIAGNOSIS — G8929 Other chronic pain: Secondary | ICD-10-CM | POA: Diagnosis present

## 2022-04-18 MED ORDER — MIDAZOLAM HCL 5 MG/5ML IJ SOLN: 0.5000 mg | Freq: Once | INTRAMUSCULAR | Status: DC

## 2022-04-18 MED ORDER — LIDOCAINE HCL 2 % IJ SOLN
20.0000 mL | Freq: Once | INTRAMUSCULAR | Status: AC
  Administered 2022-04-18: 400 mg

## 2022-04-18 MED ORDER — ROPIVACAINE HCL 2 MG/ML IJ SOLN
9.0000 mL | Freq: Once | INTRAMUSCULAR | Status: AC
  Administered 2022-04-18: 9 mL via PERINEURAL

## 2022-04-18 MED ORDER — ROPIVACAINE HCL 2 MG/ML IJ SOLN
INTRAMUSCULAR | Status: AC
  Filled 2022-04-18: qty 20

## 2022-04-18 MED ORDER — TRIAMCINOLONE ACETONIDE 40 MG/ML IJ SUSP
40.0000 mg | Freq: Once | INTRAMUSCULAR | Status: AC
  Administered 2022-04-18: 40 mg

## 2022-04-18 MED ORDER — LACTATED RINGERS IV SOLN: Freq: Once | INTRAVENOUS | Status: DC

## 2022-04-18 MED ORDER — LIDOCAINE HCL 2 % IJ SOLN
INTRAMUSCULAR | Status: AC
  Filled 2022-04-18: qty 20

## 2022-04-18 MED ORDER — TRAMADOL HCL 50 MG PO TABS: 50.0000 mg | ORAL_TABLET | Freq: Three times a day (TID) | ORAL | 0 refills | Status: DC | PRN

## 2022-04-18 MED ORDER — TRIAMCINOLONE ACETONIDE 40 MG/ML IJ SUSP
INTRAMUSCULAR | Status: AC
  Filled 2022-04-18: qty 1

## 2022-04-18 MED ORDER — PENTAFLUOROPROP-TETRAFLUOROETH EX AERO
INHALATION_SPRAY | Freq: Once | CUTANEOUS | Status: AC
  Administered 2022-04-18: 30 via TOPICAL
  Filled 2022-04-18: qty 116

## 2022-04-18 MED ORDER — FENTANYL CITRATE (PF) 100 MCG/2ML IJ SOLN: 25.0000 ug | INTRAMUSCULAR | Status: DC | PRN

## 2022-04-18 NOTE — Telephone Encounter (Signed)
Medication Refill - Medication: Finasteride '5mg'$    Has the patient contacted their pharmacy? Yes.   (Agent: If no, request that the patient contact the pharmacy for the refill. If patient does not wish to contact the pharmacy document the reason why and proceed with request.) (Agent: If yes, when and what did the pharmacy advise?) call pcp office for this refill   Preferred Pharmacy (with phone number or street name): [place], [place] - [place]  [address] [place] [address]  Phone:  [phone]  Fax:  [phone]  Has the patient been seen for an appointment in the last year OR does the patient have an upcoming appointment? Yes.    Agent: Please be advised that RX refills may take up to 3 business days. We ask that you follow-up with your pharmacy.

## 2022-04-18 NOTE — Patient Instructions (Addendum)
___________________________________________________________________________________________  Post-Radiofrequency (RF) Discharge Instructions  You have just completed a Radiofrequency Neurotomy.  The following instructions will provide you with information and guidelines for self-care upon discharge.  If at any time you have questions or concerns please call your physician. DO NOT DRIVE YOURSELF!!  Instructions: Apply ice: Fill a plastic sandwich bag with crushed ice. Cover it with a small towel and apply to injection site. Apply for 15 minutes then remove x 15 minutes. Repeat sequence on day of procedure, until you go to bed. The purpose is to minimize swelling and discomfort after procedure. Apply heat: Apply heat to procedure site starting the day following the procedure. The purpose is to treat any soreness and discomfort from the procedure. Food intake: No eating limitations, unless stipulated above.  Nevertheless, if you have had sedation, you may experience some nausea.  In this case, it may be wise to wait at least two hours prior to resuming regular diet. Physical activities: Keep activities to a minimum for the first 8 hours after the procedure. For the first 24 hours after the procedure, do not drive a motor vehicle,  Operate heavy machinery, power tools, or handle any weapons.  Consider walking with the use of an assistive device or accompanied by an adult for the first 24 hours.  Do not drink alcoholic beverages including beer.  Do not make any important decisions or sign any legal documents. Go home and rest today.  Resume activities tomorrow, as tolerated.  Use caution in moving about as you may experience mild leg weakness.  Use caution in cooking, use of household electrical appliances and climbing steps. Driving: If you have received any sedation, you are not allowed to drive for 24 hours after your procedure. Blood thinner: Restart your blood thinner 6 hours after your procedure. (Only  for those taking blood thinners) Insulin: As soon as you can eat, you may resume your normal dosing schedule. (Only for those taking insulin) Medications: May resume pre-procedure medications.  Do not take any drugs, other than what has been prescribed to you. Infection prevention: Keep procedure site clean and dry. Post-procedure Pain Diary: Extremely important that this be done correctly and accurately. Recorded information will be used to determine the next step in treatment. Pain evaluated is that of treated area only. Do not include pain from an untreated area. Complete every hour, on the hour, for the initial 8 hours. Set an alarm to help you do this part accurately. Do not go to sleep and have it completed later. It will not be accurate. Follow-up appointment: Keep your follow-up appointment after the procedure. Usually 2-6 weeks after radiofrequency. Bring you pain diary. The information collected will be essential for your long-term care.   Expect: From numbing medicine (AKA: Local Anesthetics): Numbness or decrease in pain. Onset: Full effect within 15 minutes of injected. Duration: It will depend on the type of local anesthetic used. On the average, 1 to 8 hours.  From steroids (when added): Decrease in swelling or inflammation. Once inflammation is improved, relief of the pain will follow. Onset of benefits: Depends on the amount of swelling present. The more swelling, the longer it will take for the benefits to be seen. In some cases, up to 10 days. Duration: Steroids will stay in the system x 2 weeks. Duration of benefits will depend on multiple posibilities including persistent irritating factors. From procedure: Some discomfort is to be expected once the numbing medicine wears off. In the case of radiofrequency procedures,  this may last as long as 6 weeks. Additional post-procedure pain medication is provided for this. Discomfort is minimized if ice and heat are applied as  instructed.  Call if: You experience numbness and weakness that gets worse with time, as opposed to wearing off. He experience any unusual bleeding, difficulty breathing, or loss of the ability to control your bowel and bladder. (This applies to Spinal procedures only) You experience any redness, swelling, heat, red streaks, elevated temperature, fever, or any other signs of a possible infection.  Emergency Numbers: [facility] hours (Monday - Thursday, 8:00 AM - 4:00 PM) (Friday, 9:00 AM - 12:00 Noon): [phone] After hours: [phone] ____________________________________________________________________________________________  ____________________________________________________________________________________________  Patient Information update  To: All of our patients.  Re: Name change.  It has been made official that our current name, "[facility]"   will soon be changed to "[facility]".   The purpose of this change is to eliminate any confusion created by the concept of our practice being a "Medication Management Pain [facility]". In the past this has led to the misconception that we treat pain primarily by the use of prescription medications.  Nothing can be farther from the truth.   Understanding PAIN MANAGEMENT: To further understand what our practice does, you first have to understand that "Pain Management" is a subspecialty that requires additional training once a physician has completed their specialty training, which can be in either Anesthesia, Neurology, Psychiatry, or Physical Medicine and Rehabilitation (PMR). Each one of these contributes to the final approach taken by each physician to the management of their patient's pain. To be a "Pain Management Specialist" you must have first completed one of the specialty trainings below.  Anesthesiologists -  trained in clinical pharmacology and interventional techniques such as nerve blockade and regional as well as central neuroanatomy. They are trained to block pain before, during, and after surgical interventions.  Neurologists - trained in the diagnosis and pharmacological treatment of complex neurological conditions, such as Multiple Sclerosis, Parkinson's, spinal cord injuries, and other systemic conditions that may be associated with symptoms that may include but are not limited to pain. They tend to rely primarily on the treatment of chronic pain using prescription medications.  Psychiatrist - trained in conditions affecting the psychosocial wellbeing of patients including but not limited to depression, anxiety, schizophrenia, personality disorders, addiction, and other substance use disorders that may be associated with chronic pain. They tend to rely primarily on the treatment of chronic pain using prescription medications.   Physical Medicine and Rehabilitation (PMR) physicians, also known as physiatrists - trained to treat a wide variety of medical conditions affecting the brain, spinal cord, nerves, bones, joints, ligaments, muscles, and tendons. Their training is primarily aimed at treating patients that have suffered injuries that have caused severe physical impairment. Their training is primarily aimed at the physical therapy and rehabilitation of those patients. They may also work alongside orthopedic surgeons or neurosurgeons using their expertise in assisting surgical patients to recover after their surgeries.  INTERVENTIONAL PAIN MANAGEMENT is sub-subspecialty of Pain Management.  Our physicians are Board-certified in Anesthesia, Pain Management, and Interventional Pain Management.  This meaning that not only have they been trained and Board-certified in their specialty of Anesthesia, and subspecialty of Pain Management, but they have also received further training in the sub-subspecialty of  Interventional Pain Management, in order to become Board-certified as INTERVENTIONAL PAIN MANAGEMENT SPECIALIST.  Mission: Our goal is to use our skills in  [place] as alternatives to the chronic use of prescription opioid medications for the treatment of pain. To make this more clear, we have changed our name to reflect what we do and offer. We will continue to offer medication management assessment and recommendations, but we will not be taking over any patient's medication management.  ____________________________________________________________________________________________  ___________________________________________________________________________________________  Post-Radiofrequency (RF) Discharge Instructions  You have just completed a Radiofrequency Neurotomy.  The following instructions will provide you with information and guidelines for self-care upon discharge.  If at any time you have questions or concerns please call your physician. DO NOT DRIVE YOURSELF!!  Instructions: Apply ice: Fill a plastic sandwich bag with crushed ice. Cover it with a small towel and apply to injection site. Apply for 15 minutes then remove x 15 minutes. Repeat sequence on day of procedure, until you go to bed. The purpose is to minimize swelling and discomfort after procedure. Apply heat: Apply heat to procedure site starting the day following the procedure. The purpose is to treat any soreness and discomfort from the procedure. Food intake: No eating limitations, unless stipulated above.  Nevertheless, if you have had sedation, you may experience some nausea.  In this case, it may be wise to wait at least two hours prior to resuming regular diet. Physical activities: Keep activities to a minimum for the first 8 hours after the procedure. For the first 24 hours after the procedure, do not drive a motor vehicle,  Operate heavy machinery, power tools, or handle any weapons.  Consider  walking with the use of an assistive device or accompanied by an adult for the first 24 hours.  Do not drink alcoholic beverages including beer.  Do not make any important decisions or sign any legal documents. Go home and rest today.  Resume activities tomorrow, as tolerated.  Use caution in moving about as you may experience mild leg weakness.  Use caution in cooking, use of household electrical appliances and climbing steps. Driving: If you have received any sedation, you are not allowed to drive for 24 hours after your procedure. Blood thinner: Restart your blood thinner 6 hours after your procedure. (Only for those taking blood thinners) Insulin: As soon as you can eat, you may resume your normal dosing schedule. (Only for those taking insulin) Medications: May resume pre-procedure medications.  Do not take any drugs, other than what has been prescribed to you. Infection prevention: Keep procedure site clean and dry. Post-procedure Pain Diary: Extremely important that this be done correctly and accurately. Recorded information will be used to determine the next step in treatment. Pain evaluated is that of treated area only. Do not include pain from an untreated area. Complete every hour, on the hour, for the initial 8 hours. Set an alarm to help you do this part accurately. Do not go to sleep and have it completed later. It will not be accurate. Follow-up appointment: Keep your follow-up appointment after the procedure. Usually 2-6 weeks after radiofrequency. Bring you pain diary. The information collected will be essential for your long-term care.   Expect: From numbing medicine (AKA: Local Anesthetics): Numbness or decrease in pain. Onset: Full effect within 15 minutes of injected. Duration: It will depend on the type of local anesthetic used. On the average, 1 to 8 hours.  From steroids (when added): Decrease in swelling or inflammation. Once inflammation is improved, relief of the pain will  follow. Onset of benefits:  Depends on the amount of swelling present. The more swelling, the longer it will take for the benefits to be seen. In some cases, up to 10 days. Duration: Steroids will stay in the system x 2 weeks. Duration of benefits will depend on multiple posibilities including persistent irritating factors. From procedure: Some discomfort is to be expected once the numbing medicine wears off. In the case of radiofrequency procedures, this may last as long as 6 weeks. Additional post-procedure pain medication is provided for this. Discomfort is minimized if ice and heat are applied as instructed.  Call if: You experience numbness and weakness that gets worse with time, as opposed to wearing off. He experience any unusual bleeding, difficulty breathing, or loss of the ability to control your bowel and bladder. (This applies to Spinal procedures only) You experience any redness, swelling, heat, red streaks, elevated temperature, fever, or any other signs of a possible infection.  Emergency Numbers: [facility] business hours (Monday - Thursday, 8:00 AM - 4:00 PM) (Friday, 9:00 AM - 12:00 Noon): [phone] After hours: [phone] ____________________________________________________________________________________________

## 2022-04-18 NOTE — Progress Notes (Addendum)
PROVIDER NOTE: Interpretation of information contained herein should be left to medically-trained personnel. Specific patient instructions are provided elsewhere under "Patient Instructions" section of medical record. This document was created in part using STT-dictation technology, any transcriptional errors that may result from this process are unintentional.  Patient: Aaron Gibbs Type: Established DOB: Oct 24, 1934 MRN: [id number] PCP: Olin Hauser, DO  Service: Procedure DOS: 04/18/2022 Setting: Ambulatory Location: Ambulatory outpatient facility Delivery: Face-to-face Provider: Gaspar Cola, MD Specialty: Interventional Pain Management Specialty designation: 09 Location: Outpatient facility Ref. Prov.: Milinda Pointer, MD    Primary Reason for Visit: Interventional Pain Management Treatment. CC: Knee Pain (bilaterally) and Neck Pain    Procedure:          Anesthesia, Analgesia, Anxiolysis:  Type: Therapeutic Superolateral, Superomedial, and Inferomedial, Genicular Nerve Radiofrequency Ablation (destruction).   #4  Region: Lateral, Anterior, and Medial aspects of the knee joint, above and below the knee joint proper. Level: Superior and inferior to the knee joint. Laterality: Left  Anesthesia: Local (1-2% Lidocaine)  Anxiolysis: None Patient had breakfast Sedation: None  Guidance: Fluoroscopy           Position: Supine   Indications: 1. Chronic knee pain (Left)   2. Lateral meniscal tear, sequela (Left)   3. Medial meniscal tear, sequela (Left)   4. Osteoarthritis of knee (Left)   5. Tricompartment osteoarthritis of knee (Left)    Aaron Gibbs has been dealing with the above chronic pain for longer than three months and has either failed to respond, was unable to tolerate, or simply did not get enough benefit from other more conservative therapies including, but not limited to: 1. Over-the-counter medications 2. Anti-inflammatory medications 3.  Muscle relaxants 4. Membrane stabilizers 5. Opioids 6. Physical therapy and/or chiropractic manipulation 7. Modalities (Heat, ice, etc.) 8. Invasive techniques such as nerve blocks. Aaron Gibbs has attained more than 50% relief of the pain from a series of diagnostic injections conducted in separate occasions.  Pain Score: Pre-procedure: 8 /10 Post-procedure: 6 /10    Pre-op H&P Assessment:  Aaron Gibbs is a 87 y.o. (year old), male patient, seen today for interventional treatment. He  has a past surgical history that includes Cholecystectomy and Transurethral resection of prostate (N/A, 2008). Aaron Gibbs has a current medication list which includes the following prescription(s): acetaminophen, bumetanide, carvedilol, cetirizine, cosopt, arnicare, latanoprost, losartan, melatonin, olanzapine, propylene glycol, tamsulosin, brompheniramine-pseudoephedrine-dm, buprenorphine, [START ON 10/04/2022] buprenorphine, [START ON 11/01/2022] buprenorphine, clotrimazole-betamethasone, diclofenac sodium, duloxetine, finasteride, gabapentin, iron (ferrous sulfate), lorazepam, meloxicam, nystatin, pantoprazole, and trazodone. His primarily concern today is the Knee Pain (bilaterally) and Neck Pain  Initial Vital Signs:  Pulse/HCG Rate: 71ECG Heart Rate: 76 Temp: (!) 97.1 F (36.2 C) Resp: 20 BP: 132/62 SpO2: 98 %  BMI: Estimated body mass index is 31.57 kg/m as calculated from the following:   Height as of this encounter: 5\' 10"  (1.778 m).   Weight as of this encounter: 220 lb (99.8 kg).  Risk Assessment: Allergies: Reviewed. He is allergic to anthralin and azithromycin.  Allergy Precautions: None required Coagulopathies: Reviewed. None identified.  Blood-thinner therapy: None at this time Active Infection(s): Reviewed. None identified. Aaron Gibbs is afebrile  Site Confirmation: Aaron Gibbs was asked to confirm the procedure and laterality before marking the site Procedure checklist:  Completed Consent: Before the procedure and under the influence of no sedative(s), amnesic(s), or anxiolytics, the patient was informed of the treatment options, risks and possible complications. To fulfill our ethical and legal obligations, as recommended  by the American Medical Association's Code of Ethics, I have informed the patient of my clinical impression; the nature and purpose of the treatment or procedure; the risks, benefits, and possible complications of the intervention; the alternatives, including doing nothing; the risk(s) and benefit(s) of the alternative treatment(s) or procedure(s); and the risk(s) and benefit(s) of doing nothing. The patient was provided information about the general risks and possible complications associated with the procedure. These may include, but are not limited to: failure to achieve desired goals, infection, bleeding, organ or nerve damage, allergic reactions, paralysis, and death. In addition, the patient was informed of those risks and complications associated to the procedure, such as failure to decrease pain; infection; bleeding; organ or nerve damage with subsequent damage to sensory, motor, and/or autonomic systems, resulting in permanent pain, numbness, and/or weakness of one or several areas of the body; allergic reactions; (i.e.: anaphylactic reaction); and/or death. Furthermore, the patient was informed of those risks and complications associated with the medications. These include, but are not limited to: allergic reactions (i.e.: anaphylactic or anaphylactoid reaction(s)); adrenal axis suppression; blood sugar elevation that in diabetics may result in ketoacidosis or comma; water retention that in patients with history of congestive heart failure may result in shortness of breath, pulmonary edema, and decompensation with resultant heart failure; weight gain; swelling or edema; medication-induced neural toxicity; particulate matter embolism and blood vessel  occlusion with resultant organ, and/or nervous system infarction; and/or aseptic necrosis of one or more joints. Finally, the patient was informed that Medicine is not an exact science; therefore, there is also the possibility of unforeseen or unpredictable risks and/or possible complications that may result in a catastrophic outcome. The patient indicated having understood very clearly. We have given the patient no guarantees and we have made no promises. Enough time was given to the patient to ask questions, all of which were answered to the patient's satisfaction. Mr. Ploeger has indicated that he wanted to continue with the procedure. Attestation: I, the ordering provider, attest that I have discussed with the patient the benefits, risks, side-effects, alternatives, likelihood of achieving goals, and potential problems during recovery for the procedure that I have provided informed consent. Date  Time: 04/18/2022  8:39 AM  Pre-Procedure Preparation:  Monitoring: As per clinic protocol. Respiration, ETCO2, SpO2, BP, heart rate and rhythm monitor placed and checked for adequate function Safety Precautions: Patient was assessed for positional comfort and pressure points before starting the procedure. Time-out: I initiated and conducted the "Time-out" before starting the procedure, as per protocol. The patient was asked to participate by confirming the accuracy of the "Time Out" information. Verification of the correct person, site, and procedure were performed and confirmed by me, the nursing staff, and the patient. "Time-out" conducted as per Joint Commission's Universal Protocol (UP.01.01.01). Time: 0929  Description of Procedure:          Target Area: For Genicular Nerve radiofrequency ablation (destruction), the targets are: the superolateral genicular nerve, located in the lateral distal portion of the femoral shaft as it curves to form the lateral epicondyle, in the region of the distal femoral  metaphysis; the superomedial genicular nerve, located in the medial distal portion of the femoral shaft as it curves to form the medial epicondyle; and the inferomedial genicular nerve, located in the medial, proximal portion of the tibial shaft, as it curves to form the medial epicondyle, in the region of the proximal tibial metaphysis. Approach: Anterior, ipsilateral approach. Area Prepped: Entire knee area, from mid-thigh  to mid-shin, lateral, anterior, and medial aspects. DuraPrep (Iodine Povacrylex [0.7% available iodine] and Isopropyl Alcohol, 74% w/w) Safety Precautions: Aspiration looking for blood return was conducted prior to all injections. At no point did we inject any substances, as a needle was being advanced. No attempts were made at seeking any paresthesias. Safe injection practices and needle disposal techniques used. Medications properly checked for expiration dates. SDV (single dose vial) medications used. Description of the Procedure: Protocol guidelines were followed. The patient was placed in position over the procedure table. The target area was identified and the area prepped in the usual manner. The skin and muscle were infiltrated with local anesthetic. Appropriate amount of time allowed to pass for local anesthetics to take effect. Radiofrequency needles were introduced to the target area using fluoroscopic guidance. Using the NeuroTherm NT1100 Radiofrequency Generator, sensory stimulation using 50 Hz was used to locate & identify the nerve, making sure that the needle was positioned such that there was no sensory stimulation below 0.3 V or above 0.7 V. Stimulation using 2 Hz was used to evaluate the motor component. Care was taken not to lesion any nerves that demonstrated motor stimulation of the lower extremities at an output of less than 2.5 times that of the sensory threshold, or a maximum of 2.0 V. Once satisfactory placement of the needles was achieved, the numbing solution was  slowly injected after negative aspiration. After waiting for at least 2 minutes, the ablation was performed at 80 degrees C for 60 seconds, using regular Radiofrequency settings. Once the procedure was completed, the needles were then removed and the area cleansed, making sure to leave some of the prepping solution back to take advantage of its long term bactericidal properties. Intra-operative Compliance: Compliant      Vitals:   04/18/22 0931 04/18/22 0936 04/18/22 0941 04/18/22 0949  BP: (!) 163/86 (!) 175/78 (!) 164/79 (!) 164/79  Pulse:      Resp: 18 18 16 16   Temp:      TempSrc:      SpO2: 97% 96% 97% 96%  Weight:      Height:        Start Time: 0929 hrs. End Time: 0946 hrs. Materials & Medications:  Needle(s) Type: Teflon-coated, curved tip, Radiofrequency needle(s) Gauge: 22G Length: 10cm Medication(s): Please see orders for medications and dosing details.  Imaging Guidance (Non-Spinal):          Type of Imaging Technique: Fluoroscopy Guidance (Non-Spinal) Indication(s): Assistance in needle guidance and placement for procedures requiring needle placement in or near specific anatomical locations not easily accessible without such assistance. Exposure Time: Please see nurses notes. Contrast: Before injecting any contrast, we confirmed that the patient did not have an allergy to iodine, shellfish, or radiological contrast. Once satisfactory needle placement was completed at the desired level, radiological contrast was injected. Contrast injected under live fluoroscopy. No contrast complications. See chart for type and volume of contrast used. Fluoroscopic Guidance: I was personally present during the use of fluoroscopy. "Tunnel Vision Technique" used to obtain the best possible view of the target area. Parallax error corrected before commencing the procedure. "Direction-depth-direction" technique used to introduce the needle under continuous pulsed fluoroscopy. Once target was  reached, antero-posterior, oblique, and lateral fluoroscopic projection used confirm needle placement in all planes. Images permanently stored in EMR. Interpretation: I personally interpreted the imaging intraoperatively. Adequate needle placement confirmed in multiple planes. Appropriate spread of contrast into desired area was observed. No evidence of afferent or efferent intravascular  uptake. Permanent images saved into the patient's record.  Antibiotic Prophylaxis:   Anti-infectives (From admission, onward)    None      Indication(s): None identified  Post-operative Assessment:  Post-procedure Vital Signs:  Pulse/HCG Rate: 7165 Temp: (!) 97.1 F (36.2 C) Resp: 16 BP: (!) 164/79 SpO2: 96 %  EBL: None  Complications: No immediate post-treatment complications observed by team, or reported by patient.  Note: The patient tolerated the entire procedure well. A repeat set of vitals were taken after the procedure and the patient was kept under observation following institutional policy, for this type of procedure. Post-procedural neurological assessment was performed, showing return to baseline, prior to discharge. The patient was provided with post-procedure discharge instructions, including a section on how to identify potential problems. Should any problems arise concerning this procedure, the patient was given instructions to immediately contact us, at any time, without hesitation. In any case, we plan to contact the patient by telephone for a follow-up status report regarding this interventional procedure.  Comments:  No additional relevant information.  Plan of Care  Orders:  Orders Placed This Encounter  Procedures   Radiofrequency,Genicular    Scheduling Instructions:     Side(s): Left Knee     Level(s): Superior-Lateral, Superior-Medial, and Inferior-Medial Genicular Nerve(s)     Sedation: With Sedation.     Timeframe: Today    Order Specific Question:   Where will this  procedure be performed?    Answer:   [facility]   DG PAIN [facility] C-ARM 1-60 MIN NO REPORT    Intraoperative interpretation by procedural physician at [place].    Standing Status:   Standing    Number of Occurrences:   1    Order Specific Question:   Reason for exam:    Answer:   Assistance in needle guidance and placement for procedures requiring needle placement in or near specific anatomical locations not easily accessible without such assistance.   Informed Consent Details: Physician/Practitioner Attestation; Transcribe to consent form and obtain patient signature    Note: Always confirm laterality of pain with Mr. Hamman, before procedure. Transcribe to consent form and obtain patient signature.    Order Specific Question:   Physician/Practitioner attestation of informed consent for procedure/surgical case    Answer:   I, the physician/practitioner, attest that I have discussed with the patient the benefits, risks, side effects, alternatives, likelihood of achieving goals and potential problems during recovery for the procedure that I have provided informed consent.    Order Specific Question:   Procedure    Answer:   Radiofrequency ablation of the genicular nerves of the knee    Order Specific Question:   Physician/Practitioner performing the procedure    Answer:   Keashia Haskins A. Dossie Arbour, MD    Order Specific Question:   Indication/Reason    Answer:   Chronic knee pain   Chronic Opioid Analgesic:  No opioid analgesics prescribed by our practice.  Buprenorphine 10 mcg/h patch q. 7 days.  High risk for SUD.  History of suicidal attempts and noncompliance with medication intake. ED visits thought to be due to medication binging. MME/day: 0 mg/day.   Medications ordered for procedure: Meds ordered this encounter  Medications   lidocaine (XYLOCAINE) 2 % (with pres) injection 400 mg   pentafluoroprop-tetrafluoroeth (GEBAUERS) aerosol   DISCONTD: lactated ringers  infusion   DISCONTD: midazolam (VERSED) 5 MG/5ML injection 0.5-2 mg    Make sure Flumazenil is available in the pyxis when using  this medication. If oversedation occurs, administer 0.2 mg IV over 15 sec. If after 45 sec no response, administer 0.2 mg again over 1 min; may repeat at 1 min intervals; not to exceed 4 doses (1 mg)   DISCONTD: fentaNYL (SUBLIMAZE) injection 25-50 mcg    Make sure Narcan is available in the pyxis when using this medication. In the event of respiratory depression (RR< 8/min): Titrate NARCAN (naloxone) in increments of 0.1 to 0.2 mg IV at 2-3 minute intervals, until desired degree of reversal.   ropivacaine (PF) 2 mg/mL (0.2%) (NAROPIN) injection 9 mL   triamcinolone acetonide (KENALOG-40) injection 40 mg   DISCONTD: traMADol (ULTRAM) 50 MG tablet    Sig: Take 1 tablet (50 mg total) by mouth every 8 (eight) hours as needed for up to 7 days for severe pain. Must last 7 days.    Dispense:  21 tablet    Refill:  0    For acute post-operative pain. Not to be refilled.  Must last 7 days.   DISCONTD: traMADol (ULTRAM) 50 MG tablet    Sig: Take 1 tablet (50 mg total) by mouth every 8 (eight) hours as needed for up to 7 days for severe pain. Must last 7 days.    Dispense:  21 tablet    Refill:  0    For acute post-operative pain. Not to be refilled.  Must last 7 days.   Medications administered: We administered lidocaine, pentafluoroprop-tetrafluoroeth, ropivacaine (PF) 2 mg/mL (0.2%), and triamcinolone acetonide.  See the medical record for exact dosing, route, and time of administration.  Follow-up plan:   Return in about 6 weeks (around 05/30/2022) for Proc-day (T,Th), (F2F), (PPE).       Interventional Therapies  Risk  Complexity Considerations:   Patient is legally blind. High risk for substance use disorder.  The patient has documented suicidal attempt using medications.  In addition, we have documented the patient to have a binge use pattern and been  noncompliant with the instructions on how to use the medications.  This is a reason why went to a buprenorphine patch.   Planned  Pending:   Repeat bilateral genicular nerve RFA #3    Under consideration:   Palliative bilateral genicular nerve RFA maintenance treatments   Completed:   Palliative bilateral IA Hyalgan knee injections Sx2  Diagnostic bilateral genicular NB x1  Palliative right genicular nerve RFA x2 (04/06/2020) (100/100/50/100)  Palliative left genicular nerve RFA x3 (09/28/2020) (100/50/50/90)    Therapeutic  Palliative (PRN) options:   Palliative bilateral IA Hyalgan knee injections S3/N1  Diagnostic bilateral genicular NB #2  Palliative bilateral genicular nerve RFA #3      Recent Visits No visits were found meeting these conditions. Showing recent visits within past 90 days and meeting all other requirements Today's Visits Date Type Provider Dept  09/06/22 Office Visit Milinda Pointer, MD [facility]  Showing today's visits and meeting all other requirements Future Appointments No visits were found meeting these conditions. Showing future appointments within next 90 days and meeting all other requirements  Disposition: Discharge home  Discharge (Date  Time): 04/18/2022;   hrs.   Primary Care Physician: Olin Hauser, DO Location: [facility] Note by: Gaspar Cola, MD Date: 04/18/2022; Time: 2:44 PM  Disclaimer:  Medicine is not an Chief Strategy Officer. The only guarantee in medicine is that nothing is guaranteed. It is important to note that the decision to proceed with this intervention was based on the  information collected from the patient. The Data and conclusions were drawn from the patient's questionnaire, the interview, and the physical examination. Because the information was provided in large part by the patient, it cannot be guaranteed that it has not been purposely or unconsciously  manipulated. Every effort has been made to obtain as much relevant data as possible for this evaluation. It is important to note that the conclusions that lead to this procedure are derived in large part from the available data. Always take into account that the treatment will also be dependent on availability of resources and existing treatment guidelines, considered by other Pain Management Practitioners as being common knowledge and practice, at the time of the intervention. For Medico-Legal purposes, it is also important to point out that variation in procedural techniques and pharmacological choices are the acceptable norm. The indications, contraindications, technique, and results of the above procedure should only be interpreted and judged by a Board-Certified Interventional Pain Specialist with extensive familiarity and expertise in the same exact procedure and technique.

## 2022-04-19 ENCOUNTER — Telehealth: Payer: Self-pay | Admitting: *Deleted

## 2022-04-19 ENCOUNTER — Telehealth: Payer: Self-pay

## 2022-04-19 NOTE — Telephone Encounter (Signed)
Is he still taking this. He told pain management on 10/11 he was no longer taking this so they discontinued it. Ok to refill if still taking medication

## 2022-04-19 NOTE — Telephone Encounter (Signed)
No problems post procedure. 

## 2022-04-19 NOTE — Telephone Encounter (Signed)
Bilateral.  Please note this is a duplicate request. Did they not receive this information from the last phone call 04/06/22?  [name], [facility] 04/19/2022, 4:55 PM

## 2022-04-19 NOTE — Telephone Encounter (Signed)
Copied from [place] [phone]. Topic: General - Other >> Apr 19, 2022  2:38 PM Sabas Sous wrote: [facility] needs to know if that is left right or bilateral (Diagnosis code question)  Best contact: [phone] option 2  Tiffany from [place], needs a call back

## 2022-04-20 NOTE — Telephone Encounter (Signed)
Spoke with Tiffany and she is aware.

## 2022-04-20 NOTE — Progress Notes (Signed)
PC SW placed patient on [facility]'s wait list with [facility], per [employer] RN request.

## 2022-04-24 ENCOUNTER — Telehealth: Payer: Self-pay

## 2022-04-24 ENCOUNTER — Telehealth: Payer: Self-pay | Admitting: Family Medicine

## 2022-04-24 MED ORDER — FINASTERIDE 5 MG PO TABS: 5.0000 mg | ORAL_TABLET | Freq: Every day | ORAL | 3 refills | Status: DC

## 2022-04-24 NOTE — Telephone Encounter (Signed)
This has been sent

## 2022-04-24 NOTE — Telephone Encounter (Signed)
Pts care taker called back and stated that the pt is still taking this medication - Finasteride '5mg'$  / she stated he never stopped taking it   and he is completely out since Saturday / please advise and send to [facility], [place]

## 2022-04-24 NOTE — Telephone Encounter (Signed)
330 pm.  Incoming call from caregiver Charlie.  Update provided on bp.  Today's reading is 120/63 pulse 63 and oxygen saturation is 95%.  No other concerns voiced at this time.

## 2022-04-24 NOTE — Telephone Encounter (Signed)
Pt called the after hours this weekend (04/22/2022) and stated that he is out of his finasteride wanting a refill. Stomach 6/10 pain started last night denies any diarrhea or constipation.

## 2022-04-24 NOTE — Addendum Note (Signed)
Addended by: Olin Hauser on: 04/24/2022 10:10 AM   Modules accepted: Orders

## 2022-04-30 ENCOUNTER — Other Ambulatory Visit: Payer: Self-pay

## 2022-04-30 ENCOUNTER — Emergency Department: Payer: Medicare Other

## 2022-04-30 ENCOUNTER — Emergency Department
Admission: EM | Admit: 2022-04-30 | Discharge: 2022-04-30 | Disposition: A | Discharge status: Home / Self Care | Payer: Medicare Other | Attending: Emergency Medicine | Admitting: Emergency Medicine

## 2022-04-30 DIAGNOSIS — R7989 Other specified abnormal findings of blood chemistry: Secondary | ICD-10-CM | POA: Insufficient documentation

## 2022-04-30 DIAGNOSIS — R0602 Shortness of breath: Secondary | ICD-10-CM | POA: Diagnosis present

## 2022-04-30 DIAGNOSIS — Z1152 Encounter for screening for COVID-19: Secondary | ICD-10-CM | POA: Insufficient documentation

## 2022-04-30 DIAGNOSIS — F419 Anxiety disorder, unspecified: Secondary | ICD-10-CM | POA: Insufficient documentation

## 2022-04-30 DIAGNOSIS — J449 Chronic obstructive pulmonary disease, unspecified: Secondary | ICD-10-CM | POA: Insufficient documentation

## 2022-04-30 LAB — COMPREHENSIVE METABOLIC PANEL
ALT: 11 U/L (ref 0–44)
AST: 20 U/L (ref 15–41)
Albumin: 3.7 g/dL (ref 3.5–5.0)
Alkaline Phosphatase: 71 U/L (ref 38–126)
Anion gap: 4 — ABNORMAL LOW (ref 5–15)
BUN: 23 mg/dL (ref 8–23)
CO2: 24 mmol/L (ref 22–32)
Calcium: 8.7 mg/dL — ABNORMAL LOW (ref 8.9–10.3)
Chloride: 112 mmol/L — ABNORMAL HIGH (ref 98–111)
Creatinine, Ser: 1.22 mg/dL (ref 0.61–1.24)
GFR, Estimated: 58 mL/min — ABNORMAL LOW (ref >60)
Glucose, Bld: 121 mg/dL — ABNORMAL HIGH (ref 70–99)
Potassium: 4.5 mmol/L (ref 3.5–5.1)
Sodium: 140 mmol/L (ref 135–145)
Total Bilirubin: 0.8 mg/dL (ref 0.3–1.2)
Total Protein: 7 g/dL (ref 6.5–8.1)

## 2022-04-30 LAB — CBC
HCT: 33.7 % — ABNORMAL LOW (ref 39.0–52.0)
Hemoglobin: 9.7 g/dL — ABNORMAL LOW (ref 13.0–17.0)
MCH: 23.7 pg — ABNORMAL LOW (ref 26.0–34.0)
MCHC: 28.8 g/dL — ABNORMAL LOW (ref 30.0–36.0)
MCV: 82.4 fL (ref 80.0–100.0)
Platelets: 120 10*3/uL — ABNORMAL LOW (ref 150–400)
RBC: 4.09 MIL/uL — ABNORMAL LOW (ref 4.22–5.81)
RDW: 19.1 % — ABNORMAL HIGH (ref 11.5–15.5)
WBC: 4 10*3/uL (ref 4.0–10.5)
nRBC: 0 % (ref 0.0–0.2)

## 2022-04-30 LAB — SARS CORONAVIRUS 2 BY RT PCR: SARS Coronavirus 2 by RT PCR: NEGATIVE

## 2022-04-30 LAB — TROPONIN I (HIGH SENSITIVITY)
Troponin I (High Sensitivity): 12 ng/L (ref <18)
Troponin I (High Sensitivity): 17 ng/L (ref <18)

## 2022-04-30 LAB — BRAIN NATRIURETIC PEPTIDE: B Natriuretic Peptide: 182.9 pg/mL — ABNORMAL HIGH (ref 0.0–100.0)

## 2022-04-30 MED ORDER — METHYLPREDNISOLONE SODIUM SUCC 125 MG IJ SOLR
125.0000 mg | Freq: Once | INTRAMUSCULAR | Status: AC
  Administered 2022-04-30: 125 mg via INTRAVENOUS
  Filled 2022-04-30: qty 2

## 2022-04-30 MED ORDER — IPRATROPIUM-ALBUTEROL 0.5-2.5 (3) MG/3ML IN SOLN
3.0000 mL | Freq: Once | RESPIRATORY_TRACT | Status: AC
  Administered 2022-04-30: 3 mL via RESPIRATORY_TRACT
  Filled 2022-04-30: qty 3

## 2022-04-30 MED ORDER — IOHEXOL 350 MG/ML SOLN
75.0000 mL | Freq: Once | INTRAVENOUS | Status: AC | PRN
  Administered 2022-04-30: 75 mL via INTRAVENOUS

## 2022-04-30 MED ORDER — ALBUTEROL SULFATE HFA 108 (90 BASE) MCG/ACT IN AERS: 2.0000 | INHALATION_SPRAY | RESPIRATORY_TRACT | Status: DC | PRN

## 2022-04-30 MED ORDER — LORAZEPAM 2 MG/ML IJ SOLN
0.5000 mg | Freq: Once | INTRAMUSCULAR | Status: AC
  Administered 2022-04-30: 0.5 mg via INTRAVENOUS
  Filled 2022-04-30: qty 1

## 2022-04-30 MED ORDER — ACETAMINOPHEN 500 MG PO TABS
1000.0000 mg | ORAL_TABLET | Freq: Once | ORAL | Status: AC
  Administered 2022-04-30: 1000 mg via ORAL
  Filled 2022-04-30: qty 2

## 2022-04-30 NOTE — ED Notes (Signed)
Unable to obtain IV access after 3 RN attempts, IV team consult placed. Dr. Jari Pigg, MD made aware.

## 2022-04-30 NOTE — ED Notes (Signed)
Pt able to stand and pivot with walker to use urinal at this time. States that he does feel a little dizzy.

## 2022-04-30 NOTE — ED Notes (Addendum)
Called Aaron Gibbs pt's friend to come pick him up at this time. States they will be here in the next 25 mins.

## 2022-04-30 NOTE — ED Notes (Addendum)
Pt at the end of bed, calling out "help!" Stating he needed to urinate. Pt urinated on himself. Pt is clean and dry at this time and pt placed back in bed. Pt RR increased, and placed on 2L [place] for comfort. Pt stating he needs something because is having an anxiety attack. See new orders. Dr. Jari Pigg, EDP made aware.

## 2022-04-30 NOTE — ED Notes (Signed)
Pt assisted to standing and used the urinal. 264m yellow, clear urine.

## 2022-04-30 NOTE — ED Provider Notes (Signed)
[facility] Provider Note    Event Date/Time   First MD Initiated Contact with Patient 04/30/22 1251     (approximate)   History   Shortness of Breath   HPI  Aaron Gibbs is a 87 y.o. male with history of COPD, anxiety, legally blind this who comes in with concerns for shortness of breath.  Patient typically hurt his normal routine took a nap but he woke up feeling more short of breath.  He does take Lasix.  Reports currently his pain is being an 8 out of 10 he reports he is never anything like this before.  Denies any leg swelling.  Denies any falls, no calf tenderness   Physical Exam   Triage Vital Signs: ED Triage Vitals  Enc Vitals Group     BP 04/30/22 1219 (!) 182/85     Pulse Rate 04/30/22 1219 68     Resp 04/30/22 1219 19     Temp 04/30/22 1219 98.2 F (36.8 C)     Temp Source 04/30/22 1219 Oral     SpO2 04/30/22 1219 95 %     Weight 04/30/22 1211 220 lb (99.8 kg)     Height --      Head Circumference --      Peak Flow --      Pain Score 04/30/22 1210 0     Pain Loc --      Pain Edu? --      Excl. in [place]? --     Most recent vital signs: Vitals:   04/30/22 1219  BP: (!) 182/85  Pulse: 68  Resp: 19  Temp: 98.2 F (36.8 C)  SpO2: 95%     General: Awake, no distress.  CV:  Good peripheral perfusion.  Resp:  Normal effort.  No wheezing noted Abd:  No distention.  Other:  No leg swelling   ED Results / Procedures / Treatments   Labs (all labs ordered are listed, but only abnormal results are displayed) Labs Reviewed  CBC - Abnormal; Notable for the following components:      Result Value   RBC 4.09 (*)    Hemoglobin 9.7 (*)    HCT 33.7 (*)    MCH 23.7 (*)    MCHC 28.8 (*)    RDW 19.1 (*)    Platelets 120 (*)    All other components within normal limits  COMPREHENSIVE METABOLIC PANEL  BRAIN NATRIURETIC PEPTIDE  TROPONIN I (HIGH SENSITIVITY)     EKG  My interpretation of EKG:  Difficult to see P waves but  looks sinus with PAC, no st elevation, no twi, nromal intervals   RADIOLOGY I have reviewed the xray personally and interpreted no evidence of any pneumonia   PROCEDURES:  Critical Care performed: No  .1-3 Lead EKG Interpretation  Performed by: Vanessa Dennis Port, MD Authorized by: Vanessa Comstock, MD     Interpretation: abnormal     ECG rate:  60   ECG rate assessment: normal     Rhythm: sinus rhythm     Ectopy: PAC     Conduction: normal      MEDICATIONS ORDERED IN ED: Medications  albuterol (VENTOLIN HFA) 108 (90 Base) MCG/ACT inhaler 2 puff (has no administration in time range)     IMPRESSION / MDM / ASSESSMENT AND PLAN / ED COURSE  I reviewed the triage vital signs and the nursing notes.   Patient's presentation is most consistent with acute presentation with potential  threat to life or bodily function.   Patient comes in with concerns for shortness of breath with some mild increased work of breathing but normal oxygen levels.  We will get COVID, ACS, anemia.  No obvious wheezing to suggest COPD.  No obvious fluid overload based upon examination.  Patient's COVID test is negative troponin negative x2.  CBC shows stable hemoglobin.  CMP shows stable creatinine.  BNP was slightly elevated but chest x-ray without any fluid on it and does not look significantly fluid overloaded.  Discussed with patient this comfortable trialing a CT scan  Initial EKG was being read as A-fib but looks more like sinus with a PAC.  I did look on the monitor and it does appear that patient looks more sinus but will get repeat EKG just to ensure no signs of A-fib.  Patient had off to oncoming team pending CT and repeat EKG. Did get a little anxious so 0.5 ativan given for anxiety.    The patient is on the cardiac monitor to evaluate for evidence of arrhythmia and/or significant heart rate changes.      FINAL CLINICAL IMPRESSION(S) / ED DIAGNOSES   Final diagnoses:  SOB (shortness of breath)   Anxiety     Rx / DC Orders   ED Discharge Orders     None        Note:  This document was prepared using Dragon voice recognition software and may include unintentional dictation errors.   Vanessa Ray, MD 04/30/22 [phone]

## 2022-04-30 NOTE — ED Triage Notes (Signed)
Pt in via [facility] from home with c/o SOB Pt woke up feeling SOB, did his normal routine per his care taker, took a nap and then woke up more SOB. Pt is blind and needs to be guided. Pt also anxious and gets very anxious with sudden movments. Pt takes lasix as well.   96% RA, lungs clear per EMS, 180/100

## 2022-05-01 ENCOUNTER — Other Ambulatory Visit: Payer: Self-pay | Admitting: Family Medicine

## 2022-05-01 ENCOUNTER — Ambulatory Visit: Payer: Self-pay

## 2022-05-01 MED ORDER — PANTOPRAZOLE SODIUM 40 MG PO TBEC: 40.0000 mg | DELAYED_RELEASE_TABLET | Freq: Every day | ORAL | 3 refills | Status: DC

## 2022-05-01 NOTE — Telephone Encounter (Signed)
Rec'd messege from after hour service that the pt is needing a refill on his pantoprazole and is out of this medication since yesterday do not have any to take today (since he takes t.i.d.).  Pharm: [facility] in [place]

## 2022-05-01 NOTE — Telephone Encounter (Signed)
Office visit 11/14 is fine. Thank you  [name], DO [facility] 05/01/2022, 12:29 PM

## 2022-05-01 NOTE — Telephone Encounter (Signed)
  Chief Complaint: anxiety, panic attack Symptoms: occasionally belligeret Frequency: yesterday has h/o of PA  Pertinent Negatives: Patient denies SI Disposition: '[]'$ ED /'[]'$ Urgent Care (no appt availability in office) / '[x]'$ Appointment(In office/virtual)/ '[]'$  [place] [facility]/ '[]'$ Home Care/ '[]'$ Refused Recommended Disposition /'[]'$ [place] [facility]/ '[]'$  Follow-up with PCP Additional Notes: was going to send to ED- but pt is now cooperative and cam. Appt scheduled for tomorrow. Attempted to call office but no answer. Was calling to make sure Dr. Raliegh Ip ok to see in office. Reason for Disposition  Panic attacks are increasing in frequency  Answer Assessment - Initial Assessment Questions 1. CONCERN: "Did anything happen that prompted you to call today?"      Pt anxiety,  2. ANXIETY SYMPTOMS: "Can you describe how you (your loved one; patient) have been feeling?" (e.g., tense, restless, panicky, anxious, keyed up, overwhelmed, sense of impending doom).      Yelling- wanted more pills for anxiety 3. ONSET: "How long have you been feeling this way?" (e.g., hours, days, weeks)     yesterday 4. SEVERITY: "How would you rate the level of anxiety?" (e.g., 0 - 10; or mild, moderate, severe).     Moderate to severe 5. FUNCTIONAL IMPAIRMENT: "How have these feelings affected your ability to do daily activities?" "Have you had more difficulty than usual doing your normal daily activities?" (e.g., getting better, same, worse; self-care, school, work, interactions)     worse 6. HISTORY: "Have you felt this way before?" "Have you ever been diagnosed with an anxiety problem in the past?" (e.g., generalized anxiety disorder, panic attacks, PTSD). If Yes, ask: "How was this problem treated?" (e.g., medicines, counseling, etc.)     Panic attack, yes 7. RISK OF HARM - SUICIDAL IDEATION: "Do you ever have thoughts of hurting or killing yourself?" If Yes, ask:  "Do you have these feelings now?" "Do you have a plan on  how you would do this?"     denied 8. TREATMENT:  "What has been done so far to treat this anxiety?" (e.g., medicines, relaxation strategies). "What has helped?"     Go to ED and on orescription 9. TREATMENT - THERAPIST: "Do you have a counselor or therapist? Name?"     no 10. POTENTIAL TRIGGERS: "Do you drink caffeinated beverages (e.g., coffee, colas, teas), and how much daily?" "Do you drink alcohol or use any drugs?" "Have you started any new medicines recently?"       1 cup coffee- no drugs and no alcohol 11. PATIENT SUPPORT: "Who is with you now?" "Who do you live with?" "Do you have family or friends who you can talk to?"        Caller PCA- daughter and   67. OTHER SYMPTOMS: "Do you have any other symptoms?" (e.g., feeling depressed, trouble concentrating, trouble sleeping, trouble breathing, palpitations or fast heartbeat, chest pain, sweating, nausea, or diarrhea)       Yells, gets angry 13. PREGNANCY: "Is there any chance you are pregnant?" "When was your last menstrual period?"       N/a  Protocols used: Anxiety and Panic Attack-A-AH

## 2022-05-01 NOTE — Telephone Encounter (Signed)
Pt needs a refill on pantoprazole.  This was prescribed by his past doctor .  [facility] [address]  [phone]

## 2022-05-02 ENCOUNTER — Ambulatory Visit: Payer: Medicare Other | Admitting: Family Medicine

## 2022-05-02 ENCOUNTER — Encounter: Payer: Self-pay | Admitting: Family Medicine

## 2022-05-02 ENCOUNTER — Telehealth: Payer: Self-pay | Admitting: Pain Medicine

## 2022-05-02 ENCOUNTER — Telehealth: Payer: Self-pay | Admitting: Family Medicine

## 2022-05-02 ENCOUNTER — Ambulatory Visit: Payer: Self-pay | Admitting: *Deleted

## 2022-05-02 VITALS — BP 142/94 | HR 72 | Ht 70.0 in | Wt 220.0 lb

## 2022-05-02 DIAGNOSIS — Z23 Encounter for immunization: Secondary | ICD-10-CM | POA: Diagnosis not present

## 2022-05-02 DIAGNOSIS — B372 Candidiasis of skin and nail: Secondary | ICD-10-CM

## 2022-05-02 DIAGNOSIS — F411 Generalized anxiety disorder: Secondary | ICD-10-CM

## 2022-05-02 DIAGNOSIS — F410 Panic disorder [episodic paroxysmal anxiety] without agoraphobia: Secondary | ICD-10-CM

## 2022-05-02 DIAGNOSIS — F332 Major depressive disorder, recurrent severe without psychotic features: Secondary | ICD-10-CM | POA: Diagnosis not present

## 2022-05-02 MED ORDER — CLOTRIMAZOLE-BETAMETHASONE 1-0.05 % EX CREA: TOPICAL_CREAM | Freq: Two times a day (BID) | CUTANEOUS | 2 refills | Status: DC

## 2022-05-02 MED ORDER — LORAZEPAM 0.5 MG PO TABS: 0.5000 mg | ORAL_TABLET | Freq: Two times a day (BID) | ORAL | 2 refills | Status: DC | PRN

## 2022-05-02 MED ORDER — NYSTATIN 100000 UNIT/GM EX POWD: 1.0000 | Freq: Three times a day (TID) | CUTANEOUS | 2 refills | Status: DC

## 2022-05-02 NOTE — Patient Instructions (Addendum)
Thank you for coming to the office today.  For anxiety and panic  Start back on the Lorazepam 0.'5mg'$  twice a day regular dosing, can take as needed for anxiety and panic, only take 1 extra dose in emergency only. Please report this to your pain doctor.  Caution with Tramadol in addition to your patches, It can cause dizzy and side effects, same with the lorazepam.  For the fungal rash, goal is dry and avoid heat / moisture Use topical cream twice a day for 7-10 days and if not healing can use topical Nystatin anti fungal powder 3 times a day for 2-4 weeks  Call palliative to help further with sudden or new symptoms and medication adjustments.   Please schedule a Follow-up Appointment to: Return in about 6 weeks (around 06/13/2022) for 6 weeks follow-up anxiety.  If you have any other questions or concerns, please feel free to call the office or send a message through [place]. You may also schedule an earlier appointment if necessary.  Additionally, you may be receiving a survey about your experience at our office within a few days to 1 week by e-mail or mail. We value your feedback.  [name], DO [facility]

## 2022-05-02 NOTE — Telephone Encounter (Signed)
PT stated that doctor at [facility] called him in some pain medications ,due to patient will be having knee surgery. PT stated that pharmacy will not fill due to patient under care with doctor Naveira.PT thinks that pharmacy may need an ok to fill prescription. Please give patient a call. Thanks

## 2022-05-02 NOTE — Progress Notes (Unsigned)
Subjective:    Patient ID: Aaron Gibbs, male    DOB: Jul 26, 1934, 87 y.o.   MRN: [id number]  Aaron Gibbs is a 87 y.o. male presenting on 05/02/2022 for Hospitalization Follow-up and Anxiety   HPI  ED FOLLOW-UP VISIT  Hospital/Location: [facility] Date of ED Visit: 04/30/22  Reason for Presenting to ED: Shortness of Breath / Anxiety Panic Attacks  FOLLOW-UP  - ED provider note and record have been reviewed - Patient presents today about 2 days after recent ED visit. Brief summary of recent course, patient had symptoms of irritability agitation and anxiety worsening assoc with shortness of breath panic attacks, presented to ED on 04/30/22, testing in ED with labs CMET BNP, CXR Troponin, COVID CT Chest, treated with lorazepam resolved symptoms, discharged..  - Today reports overall has done well after discharge from ED. Symptoms of anxiety improved on med, but still difficulty.  Previously he took Lorazepam 0.'5mg'$  twice a day, had been off since returned to [facility]  Palliative care one visit, no recent updates.  There was concern that patient was taking Tramadol and patches and was having side effect or dizziness  Yeast Fungal Rash on Abdomen, redness between skin fold.  I have reviewed the discharge medication list, and have reconciled the current and discharge medications today.      05/02/2022    2:16 PM 04/18/2022    8:47 AM 02/27/2022   11:33 AM  Depression screen PHQ 2/9  Decreased Interest 2 0 0  Down, Depressed, Hopeless 1 0 0  PHQ - 2 Score 3 0 0  Altered sleeping 3    Tired, decreased energy 1    Change in appetite 3    Feeling bad or failure about yourself  0    Trouble concentrating 1    Moving slowly or fidgety/restless 0    Suicidal thoughts 0    PHQ-9 Score 11    Difficult doing work/chores Somewhat difficult      Social History   Tobacco Use   Smoking status: Former    Years: 20.00    Types: Cigarettes   Smokeless tobacco: Current    Types: Chew    Tobacco comments:    stopped 15 years ago  Vaping Use   Vaping Use: Never used  Substance Use Topics   Alcohol use: Not Currently    Comment: past   Drug use: Never    Review of Systems Per HPI unless specifically indicated above     Objective:    BP (!) 142/94 (BP Location: Left Arm, Cuff Size: Normal)   Pulse 72   Ht '5\' 10"'$  (1.778 m)   Wt 220 lb (99.8 kg)   SpO2 97%   BMI 31.57 kg/m   Wt Readings from Last 3 Encounters:  05/02/22 220 lb (99.8 kg)  04/30/22 220 lb (99.8 kg)  04/18/22 220 lb (99.8 kg)    Physical Exam Vitals and nursing note reviewed.  Constitutional:      General: He is not in acute distress.    Appearance: Normal appearance. He is well-developed. He is obese. He is not diaphoretic.     Comments: Elderly chronically ill appearing, cooperative  HENT:     Head: Normocephalic and atraumatic.  Eyes:     General:        Right eye: No discharge.        Left eye: No discharge.     Conjunctiva/sclera: Conjunctivae normal.  Cardiovascular:     Rate and Rhythm: Normal rate.  Pulmonary:     Comments: Reduced air movement, some inc baseline work of breathing Musculoskeletal:     Comments: Wheelchair bound  Skin:    General: Skin is warm and dry.     Findings: Lesion and rash (R abdomen in pannus skin fold intertrigo candida) present. No erythema.  Neurological:     Mental Status: He is alert and oriented to person, place, and time.  Psychiatric:        Mood and Affect: Mood normal.        Behavior: Behavior normal.        Thought Content: Thought content normal.     Comments: Well groomed, good eye contact, normal speech and thoughts       Results for orders placed or performed during the hospital encounter of 04/30/22  SARS Coronavirus 2 by RT PCR (hospital order, performed in [facility] hospital lab) *cepheid single result test* Anterior Nasal Swab   Specimen: Anterior Nasal Swab  Result Value Ref Range   SARS Coronavirus 2 by RT PCR  NEGATIVE NEGATIVE  CBC  Result Value Ref Range   WBC 4.0 4.0 - 10.5 K/uL   RBC 4.09 (L) 4.22 - 5.81 MIL/uL   Hemoglobin 9.7 (L) 13.0 - 17.0 g/dL   HCT 33.7 (L) 39.0 - 52.0 %   MCV 82.4 80.0 - 100.0 fL   MCH 23.7 (L) 26.0 - 34.0 pg   MCHC 28.8 (L) 30.0 - 36.0 g/dL   RDW 19.1 (H) 11.5 - 15.5 %   Platelets 120 (L) 150 - 400 K/uL   nRBC 0.0 0.0 - 0.2 %  Comprehensive metabolic panel  Result Value Ref Range   Sodium 140 135 - 145 mmol/L   Potassium 4.5 3.5 - 5.1 mmol/L   Chloride 112 (H) 98 - 111 mmol/L   CO2 24 22 - 32 mmol/L   Glucose, Bld 121 (H) 70 - 99 mg/dL   BUN 23 8 - 23 mg/dL   Creatinine, Ser 1.22 0.61 - 1.24 mg/dL   Calcium 8.7 (L) 8.9 - 10.3 mg/dL   Total Protein 7.0 6.5 - 8.1 g/dL   Albumin 3.7 3.5 - 5.0 g/dL   AST 20 15 - 41 U/L   ALT 11 0 - 44 U/L   Alkaline Phosphatase 71 38 - 126 U/L   Total Bilirubin 0.8 0.3 - 1.2 mg/dL   GFR, Estimated 58 (L) >60 mL/min   Anion gap 4 (L) 5 - 15  Brain natriuretic peptide  Result Value Ref Range   B Natriuretic Peptide 182.9 (H) 0.0 - 100.0 pg/mL  Troponin I (High Sensitivity)  Result Value Ref Range   Troponin I (High Sensitivity) 12 <18 ng/L  Troponin I (High Sensitivity)  Result Value Ref Range   Troponin I (High Sensitivity) 17 <18 ng/L      Assessment & Plan:   Problem List Items Addressed This Visit     Depression   Relevant Medications   LORazepam (ATIVAN) 0.5 MG tablet   Generalized anxiety disorder with panic attacks - Primary   Relevant Medications   LORazepam (ATIVAN) 0.5 MG tablet   Other Visit Diagnoses     Needs flu shot       Relevant Orders   Flu Vaccine QUAD High Dose(Fluad)   Need for vaccination against Streptococcus pneumoniae       Relevant Orders   Pneumococcal conjugate vaccine 20-valent   Candidal intertrigo       Relevant Medications   clotrimazole-betamethasone (LOTRISONE) cream  nystatin (MYCOSTATIN/NYSTOP) powder       For anxiety and panic  Start back on the Lorazepam  0.'5mg'$  twice a day regular dosing, can take as needed for anxiety and panic, only take 1 extra dose in emergency only. Please report this to your pain doctor.  Caution with Tramadol in addition to your patches, It can cause dizzy and side effects, same with the lorazepam.  For the fungal rash, goal is dry and avoid heat / moisture Use topical cream twice a day for 7-10 days and if not healing can use topical Nystatin anti fungal powder 3 times a day for 2-4 weeks  Call palliative to help further with sudden or new symptoms and medication adjustments.  Orders Placed This Encounter  Procedures   Pneumococcal conjugate vaccine 20-valent   Flu Vaccine QUAD High Dose(Fluad)     Meds ordered this encounter  Medications   LORazepam (ATIVAN) 0.5 MG tablet    Sig: Take 1 tablet (0.5 mg total) by mouth 2 (two) times daily as needed for anxiety.    Dispense:  60 tablet    Refill:  2   clotrimazole-betamethasone (LOTRISONE) cream    Sig: Apply topically 2 (two) times daily. For 7-10 days    Dispense:  30 g    Refill:  2   nystatin (MYCOSTATIN/NYSTOP) powder    Sig: Apply 1 Application topically 3 (three) times daily. For 2-4 weeks.    Dispense:  15 g    Refill:  2      Follow up plan: Return in about 6 weeks (around 06/13/2022) for 6 weeks follow-up anxiety.   [name], [facility] 05/02/2022, 2:18 PM

## 2022-05-02 NOTE — Telephone Encounter (Signed)
Please let them know that - Yes she is correct.  He should limit use of tramadol. Doesn't mean he has to stop taking it 100%, but be very cautious, because I agree it can be contributing to his dizziness and side effect.  The new rx Lorazepam can control his anxiety and panic, as he has used this before with success.  Unfortunately all of his meds combined can be contributing to dizziness that may not be possible to solve.  [name], [facility] 05/02/2022, 4:23 PM

## 2022-05-02 NOTE — Telephone Encounter (Signed)
Summary: med abuse   Patient's caretaker called in concerned that patient is abusing meds. He is already on tramadol and pain meds she says and just requested hydrocodone also, says the patient will hardly get out of bed and can , only showers once a week and gets real upset at her when she tries to help. Please call back to discuss.      Chief Complaint: caregiver [name] concerned patient prescribed too many pain medications. Increase anxiety, angers easily  Symptoms: caregiver reports patient requesting pain medication and is already on multiple medications. Concerned patient may be abusing pain medications. Only meds noted on current med list tylenol and caregiver reports patient take 2 500 mg tablets 3 times a day. Tramadol 50 mg every 8 hours. Pain patch applied. Now patient has been prescribed hydrocodone from "knee dr".  Frequency: na Pertinent Negatives: Patient denies na Disposition: '[]'$ ED /'[]'$ Urgent Care (no appt availability in office) / '[x]'$ Appointment(In office/virtual)/ '[]'$  [place] [facility]/ '[]'$ Home Care/ '[]'$ Refused Recommended Disposition /'[]'$ [facility]/ '[]'$  Follow-up with PCP Additional Notes:   Appt scheduled today . Please advise regarding medications.       Reason for Disposition  [1] Caller has URGENT medicine question about med that PCP or specialist prescribed AND [2] triager unable to answer question  Answer Assessment - Initial Assessment Questions 1. NAME of MEDICINE: "What medicine(s) are you calling about?"     Hydrocodone, tramadol, pain patches 2. QUESTION: "What is your question?" (e.g., double dose of medicine, side effect)     Caregiver Charlie not on DPR concerned regarding multiple pain medications prescribed by different Dr. Patient becoming angry when he is not getting pain medications. Concerned about anxiety and medication abuse. 3. PRESCRIBER: "Who prescribed the medicine?" Reason: if prescribed by specialist, call should be referred to  that group.     specialist 4. SYMPTOMS: "Do you have any symptoms?" If Yes, ask: "What symptoms are you having?"  "How bad are the symptoms (e.g., mild, moderate, severe)     Patient coming angry wanting pain medicaitons often 5. PREGNANCY:  "Is there any chance that you are pregnant?" "When was your last menstrual period?"     na  Protocols used: Medication Question Call-A-AH

## 2022-05-02 NOTE — Telephone Encounter (Signed)
Charlie Called to see if Dr. Raliegh Ip can call the pt to advised him that he was advised to back off from taking the tramadol to see if that was the cause of his dizziness and to start taking LoRazepam / please advise   [name] said when she reminded the pt about this he was very upset and told her she doesn't know what she is talking about

## 2022-05-04 ENCOUNTER — Telehealth: Payer: Self-pay | Admitting: Pain Medicine

## 2022-05-04 ENCOUNTER — Ambulatory Visit: Payer: Self-pay | Admitting: *Deleted

## 2022-05-04 NOTE — Telephone Encounter (Signed)
Caregiver Charlie calling in.  She is c/o of him not not using the powder and cream for his skin properly.   He is getting mad with [name] the caregiver.  He refuses to take a shower.    I just don't know what to do with him any more.  She is calling in seeking help.   Doesn't know what else to do with pt.  I let her know I would pass this information along to Dr. Parks Ranger.   She thanked me for doing this.       Reason for Disposition  [1] Caller requesting NON-URGENT health information AND [2] PCP's office is the best resource  Answer Assessment - Initial Assessment Questions 1. REASON FOR CALL or QUESTION: "What is your reason for calling today?" or "How can I best help you?" or "What question do you have that I can help answer?"     See notes.   Pt is refusing treatment and caregiver, [name] doesn't know what else to do.  His skin is all irritated and he won't let her treat him properly with the powder and cream that was prescribed.  Protocols used: Information Only Call - No Triage-A-AH

## 2022-05-04 NOTE — Telephone Encounter (Signed)
Patient called on 05-02-22 about pain meds sent in to his pharmacy from knee surgeon. His pharmacy is refusing to fill the script without ok from our office. Please call pharmacy and patient

## 2022-05-04 NOTE — Telephone Encounter (Signed)
[facility], informed them that it is not a violation of the Pain Medication Agreement to receive opioids from another physician for an acute event. They told me that the Rx from the surgeon has been cancelled. Called patient to tell him that. Spoke with family member to inform her of this, she reports that patient has not had surgery, was asking for additional meds for knee pain. I informed her that is a violation of Pain Contract.

## 2022-05-08 ENCOUNTER — Ambulatory Visit: Payer: Self-pay | Admitting: *Deleted

## 2022-05-08 ENCOUNTER — Other Ambulatory Visit: Payer: Self-pay | Admitting: Family Medicine

## 2022-05-08 DIAGNOSIS — F410 Panic disorder [episodic paroxysmal anxiety] without agoraphobia: Secondary | ICD-10-CM

## 2022-05-08 DIAGNOSIS — F332 Major depressive disorder, recurrent severe without psychotic features: Secondary | ICD-10-CM

## 2022-05-08 MED ORDER — DULOXETINE HCL 30 MG PO CPEP: 90.0000 mg | ORAL_CAPSULE | Freq: Every day | ORAL | 1 refills | Status: DC

## 2022-05-08 NOTE — Telephone Encounter (Signed)
Requested medication (s) are due for refill today: -  Requested medication (s) are on the active medication list: historical med  Last refill:  01/29/22  Future visit scheduled: no  Notes to clinic:  historical provider- pt called asking if PCP could prescribe this medication   Requested Prescriptions  Pending Prescriptions Disp Refills   DULoxetine (CYMBALTA) 30 MG capsule      Sig: Take 3 capsules (90 mg total) by mouth daily.     Psychiatry: Antidepressants - SNRI - duloxetine Failed - 05/08/2022 11:47 AM      Failed - Last BP in normal range    BP Readings from Last 1 Encounters:  05/02/22 (!) 142/94         Passed - Cr in normal range and within 360 days    Creatinine, Ser  Date Value Ref Range Status  04/30/2022 1.22 0.61 - 1.24 mg/dL Final   Creatinine, Urine  Date Value Ref Range Status  12/13/2019 631 mg/dL Final    Comment:    RESULTS CONFIRMED BY MANUAL DILUTION Performed at [facility], [address], [place], [place] [address]          Passed - eGFR is 30 or above and within 360 days    GFR calc Af Amer  Date Value Ref Range Status  02/09/2020 >60 >60 mL/min Final   GFR, Estimated  Date Value Ref Range Status  04/30/2022 58 (L) >60 mL/min Final    Comment:    (NOTE) Calculated using the CKD-EPI Creatinine Equation (2021)          Passed - Completed PHQ-2 or PHQ-9 in the last 360 days      Passed - Valid encounter within last 6 months    Recent Outpatient Visits           6 days ago Generalized anxiety disorder with panic attacks   [place], DO   4 weeks ago Other polyneuropathy   [place], DO   3 months ago Essential hypertension   [place], DO   1 year ago Severe episode of recurrent major depressive disorder, with psychotic features [facility])   [place], DO    1 year ago Seasonal allergic rhinitis due to other allergic trigger   [place], Devonne Doughty, DO

## 2022-05-08 NOTE — Telephone Encounter (Signed)
  Chief Complaint: anxiety feels medication is not working  Symptoms: anxious  Frequency: since 04/21/22 Pertinent Negatives: Patient denies na  Disposition: '[]'$ ED /'[]'$ Urgent Care (no appt availability in office) / '[]'$ Appointment(In office/virtual)/ '[]'$  [facility]/ '[]'$ Home Care/ '[]'$ Refused Recommended Disposition /'[]'$ [facility]/ '[x]'$  Follow-up with PCP    Patient calling to report zyprexia 2.5 mg no helping with anxiety. Patient just received lorazepam 0.5 mg at 3 pm per caregiver and patient continues to c/o anxiety. Provided emotional support to patient to allow medication more time to help him relax. Please advise    Reason for Disposition  [1] Symptoms of anxiety or panic attack AND [2] is a chronic symptom (recurrent or ongoing AND present > 4 weeks)  Answer Assessment - Initial Assessment Questions 1. CONCERN: "Did anything happen that prompted you to call today?"      Anxiety and reports medication is not working  2. ANXIETY SYMPTOMS: "Can you describe how you (your loved one; patient) have been feeling?" (e.g., tense, restless, panicky, anxious, keyed up, overwhelmed, sense of impending doom).      Feels anxious regarding "new medication" not working  3. ONSET: "How long have you been feeling this way?" (e.g., hours, days, weeks)     Since 04/21/22 4. SEVERITY: "How would you rate the level of anxiety?" (e.g., 0 - 10; or mild, moderate, severe).     na 5. FUNCTIONAL IMPAIRMENT: "How have these feelings affected your ability to do daily activities?" "Have you had more difficulty than usual doing your normal daily activities?" (e.g., getting better, same, worse; self-care, school, work, interactions)     Yes  6. HISTORY: "Have you felt this way before?" "Have you ever been diagnosed with an anxiety problem in the past?" (e.g., generalized anxiety disorder, panic attacks, PTSD). If Yes, ask: "How was this problem treated?" (e.g., medicines, counseling, etc.)     Yes  7.  RISK OF HARM - SUICIDAL IDEATION: "Do you ever have thoughts of hurting or killing yourself?" If Yes, ask:  "Do you have these feelings now?" "Do you have a plan on how you would do this?"     na 8. TREATMENT:  "What has been done so far to treat this anxiety?" (e.g., medicines, relaxation strategies). "What has helped?"     Caregiver Charlie gave lorazepam 0.5 mg at 3 pm to help with anxiety  9. TREATMENT - THERAPIST: "Do you have a counselor or therapist? Name?"     na 10. POTENTIAL TRIGGERS: "Do you drink caffeinated beverages (e.g., coffee, colas, teas), and how much daily?" "Do you drink alcohol or use any drugs?" "Have you started any new medicines recently?"       na 11. PATIENT SUPPORT: "Who is with you now?" "Who do you live with?" "Do you have family or friends who you can talk to?"        caregiver 12. OTHER SYMPTOMS: "Do you have any other symptoms?" (e.g., feeling depressed, trouble concentrating, trouble sleeping, trouble breathing, palpitations or fast heartbeat, chest pain, sweating, nausea, or diarrhea)       Trouble sleeping  13. PREGNANCY: "Is there any chance you are pregnant?" "When was your last menstrual period?"       na  Protocols used: Anxiety and Panic Attack-A-AH

## 2022-05-08 NOTE — Telephone Encounter (Signed)
I am running out of options to manage this patient's anxiety symptoms. He is on several medications for mood/mental health  and now anxiety regimen.  He is on Amitriptyline, Duloxetine, Zyprexa, Trazodone.  He was recently started on Lorazepam as AS NEEDED for anxiety, seems less effective.  He would need further medication management from mental health, but we have been unsuccessful at arranging Geriatric Psychiatry referral.  Palliative Care is on board to assist with symptom management. I will route this question to Palliative team now for further assistance if there are any other suggestions for anxiety symptom relief for this patient.  [name], [facility] 05/08/2022, 5:30 PM

## 2022-05-08 NOTE — Telephone Encounter (Signed)
Medication Refill - Medication: DULoxetine (CYMBALTA) 30 MG capsule   Has the patient contacted their pharmacy? No. Pt is new w/ Dr Raliegh Ip and the dr has never filled this Rx for him yet. Preferred Pharmacy (with phone number or street name):  [facility], [place]   Has the patient been seen for an appointment in the last year OR does the patient have an upcoming appointment? Yes.    Agent: Please be advised that RX refills may take up to 3 business days. We ask that you follow-up with your pharmacy.

## 2022-05-09 ENCOUNTER — Ambulatory Visit: Payer: Self-pay

## 2022-05-09 ENCOUNTER — Telehealth: Payer: Medicare Other | Admitting: Family Medicine

## 2022-05-09 NOTE — Telephone Encounter (Signed)
  Chief Complaint: panic attack Symptoms: anxiety, trouble sleeping, worried about upcoming knee surgery, hyperventilation, chest pain, sweating,  Frequency: 4 days Pertinent Negatives: Patient denies suicidal ideation Disposition: '[x]'$ ED /'[]'$ Urgent Care (no appt availability in office) / '[]'$ Appointment(In office/virtual)/ '[]'$  [facility]/ '[]'$ Home Care/ '[]'$ Refused Recommended Disposition /'[]'$ [facility]/ '[]'$  Follow-up with PCP Additional Notes: called office and no openings virtually Reason for Disposition  [1] SEVERE anxiety (e.g., extremely anxious with intense emotional symptoms such as feeling of unreality, urge to flee, unable to calm down; unable to cope or function) AND [2] not better after 10 minutes of reassurance and Care Advice  Answer Assessment - Initial Assessment Questions 1. CONCERN: "Did anything happen that prompted you to call today?"      Worried about upcoming surgery (knee) 2. ANXIETY SYMPTOMS: "Can you describe how you (your loved one; patient) have been feeling?" (e.g., tense, restless, panicky, anxious, keyed up, overwhelmed, sense of impending doom).      Anxious, worried 3. ONSET: "How long have you been feeling this way?" (e.g., hours, days, weeks)     4 days 4. SEVERITY: "How would you rate the level of anxiety?" (e.g., 0 - 10; or mild, moderate, severe).     severe 5. FUNCTIONAL IMPAIRMENT: "How have these feelings affected your ability to do daily activities?" "Have you had more difficulty than usual doing your normal daily activities?" (e.g., getting better, same, worse; self-care, school, work, interactions)     Yes- yes 6. HISTORY: "Have you felt this way before?" "Have you ever been diagnosed with an anxiety problem in the past?" (e.g., generalized anxiety disorder, panic attacks, PTSD). If Yes, ask: "How was this problem treated?" (e.g., medicines, counseling, etc.)     No- knee-yes 7. RISK OF HARM - SUICIDAL IDEATION: "Do you ever have  thoughts of hurting or killing yourself?" If Yes, ask:  "Do you have these feelings now?" "Do you have a plan on how you would do this?"     no 8. TREATMENT:  "What has been done so far to treat this anxiety?" (e.g., medicines, relaxation strategies). "What has helped?"     Deep breath no 9. TREATMENT - THERAPIST: "Do you have a counselor or therapist? Name?"     no 10. POTENTIAL TRIGGERS: "Do you drink caffeinated beverages (e.g., coffee, colas, teas), and how much daily?" "Do you drink alcohol or use any drugs?" "Have you started any new medicines recently?"       No-no-antianxiety med 11. PATIENT SUPPORT: "Who is with you now?" "Who do you live with?" "Do you have family or friends who you can talk to?"        caretaker 29. OTHER SYMPTOMS: "Do you have any other symptoms?" (e.g., feeling depressed, trouble concentrating, trouble sleeping, trouble breathing, palpitations or fast heartbeat, chest pain, sweating, nausea, or diarrhea)       Trouble sleeping, breathing, panic attack,hyperventilation  13. PREGNANCY: "Is there any chance you are pregnant?" "When was your last menstrual period?"       N/a  Protocols used: Anxiety and Panic Attack-A-AH

## 2022-05-09 NOTE — Telephone Encounter (Signed)
Sent message through [place] with [place] contacts in the [place] area as well as sent phone number  and address of [place].

## 2022-05-17 ENCOUNTER — Other Ambulatory Visit: Payer: Medicare Other

## 2022-05-23 NOTE — Progress Notes (Addendum)
Patient: Aaron Gibbs  Service Category: E/M  Provider: Gaspar Cola, MD  DOB: 12-05-1934  DOS: 05/24/2022  Location: Office  MRN: [id number]  Setting: Ambulatory outpatient  Referring Provider: [name] *  Type: Established Patient  Specialty: Interventional Pain Management  PCP: Olin Hauser, DO  Location: Remote location  Delivery: TeleHealth     Virtual Encounter - Pain Management PROVIDER NOTE: Information contained herein reflects review and annotations entered in association with encounter. Interpretation of such information and data should be left to medically-trained personnel. Information provided to patient can be located elsewhere in the medical record under "Patient Instructions". Document created using STT-dictation technology, any transcriptional errors that may result from process are unintentional.    Contact & Pharmacy Preferred: [phone] Home: [phone] (home) Mobile: [phone] (mobile) E-mail: [email or web address] .[email or web address], [place] - [address] [place] [address] Phone: [phone] Fax: [phone]   Pre-screening  Aaron Gibbs offered "in-person" vs "virtual" encounter. He indicated preferring virtual for this encounter.   Reason COVID-19*  Social distancing based on CDC and AMA recommendations.   I contacted Aaron Gibbs on 05/24/2022 via telephone.      I clearly identified myself as Gaspar Cola, MD. I verified that I was speaking with the correct person using two identifiers (Name: Aaron Gibbs, and date of birth: March 11, 1982).  Consent I sought verbal advanced consent from Aaron Gibbs for virtual visit interactions. I informed Aaron Gibbs of possible security and privacy concerns, risks, and limitations associated with providing "not-in-person" medical evaluation and management services. I also informed Aaron Gibbs of the availability of "in-person"  appointments. Finally, I informed him that there would be a charge for the virtual visit and that he could be  personally, fully or partially, financially responsible for it. Aaron Gibbs expressed understanding and agreed to proceed.   Historic Elements   Aaron Gibbs is a 87 y.o. year old, male patient evaluated today after our last contact on 05/04/2022. Aaron Gibbs  has a past medical history of Anxiety, Cancer ([place]), COPD (chronic obstructive pulmonary disease) ([place]), Glaucoma, and Osteoporosis. He also  has a past surgical history that includes Cholecystectomy and Transurethral resection of prostate (N/A, 2008). Aaron Gibbs has a current medication list which includes the following prescription(s): acetaminophen, amitriptyline, bumetanide, buprenorphine, carvedilol, cetirizine, clotrimazole-betamethasone, cosopt, duloxetine, finasteride, gabapentin, arnicare, latanoprost, lorazepam, losartan, melatonin, nystatin, olanzapine, pantoprazole, propylene glycol, tamsulosin, trazodone, [START ON 06/21/2022] buprenorphine, [START ON 07/19/2022] buprenorphine, [START ON 08/16/2022] buprenorphine, diclofenac sodium, and tramadol. He  reports that he has quit smoking. His smoking use included cigarettes. His smokeless tobacco use includes chew. He reports that he does not currently use alcohol. He reports that he does not use drugs. Aaron Gibbs is allergic to azithromycin.  Estimated body mass index is 31.57 kg/m as calculated from the following:   Height as of 05/02/22: _1  (1.778 m).   Weight as of 05/02/22: 220 lb (99.8 kg).  HPI  Today, he is being contacted for a post-procedure assessment.  The patient indicates having attained an ongoing 40% improvement of the left knee pain.  He was recently seen at [facility] and he is scheduled to undergo total knee replacement of the left knee around January 12.  He has enough medication to last until January 3. The patient indicates doing well with the  current medication regimen. No adverse reactions or side effects reported to the medications.  In view of this and the fact that he  is spending surgery and there will be a recovery.  After that, today I will be sending refills to his pharmacy.  RTCB: 09/13/2022   Post-procedure evaluation    Procedure:          Anesthesia, Analgesia, Anxiolysis:  Type: Therapeutic Superolateral, Superomedial, and Inferomedial, Genicular Nerve Radiofrequency Ablation (destruction).   #3  Region: Lateral, Anterior, and Medial aspects of the knee joint, above and below the knee joint proper. Level: Superior and inferior to the knee joint. Laterality: Left  Anesthesia: Local (1-2% Lidocaine)  Anxiolysis: None Patient had breakfast Sedation: None  Guidance: Fluoroscopy           Position: Supine   Indications: 1. Chronic knee pain (Left)   2. Lateral meniscal tear, sequela (Left)   3. Medial meniscal tear, sequela (Left)   4. Osteoarthritis of knee (Left)   5. Tricompartment osteoarthritis of knee (Left)    Pain Score: Pre-procedure: 8 /10 Post-procedure: 6 /10     Effectiveness:  Initial hour after procedure: 100 %. Subsequent 4-6 hours post-procedure: 100 %. Analgesia past initial 6 hours: 30 %. Ongoing improvement:  Analgesic: Cording to the patient he currently has an ongoing 40% improvement of the pain in the left knee. Function: Somewhat improved ROM: Somewhat improved  Pharmacotherapy Assessment   Opioid Analgesic: No opioid analgesics prescribed by our practice.  Buprenorphine 10 mcg/h patch q. 7 days.  High risk for SUD.  History of suicidal attempts and noncompliance with medication intake. ED visits thought to be due to medication binging. MME/day: 0 mg/day.   Monitoring: [place] PMP: PDMP reviewed during this encounter.       Pharmacotherapy: No side-effects or adverse reactions reported. Compliance: No problems identified. Effectiveness: Clinically acceptable. Plan: Refer to "POC".  UDS:  Summary  Date Value Ref Range Status  01/26/2021 Note  Final    Comment:    ==================================================================== [employer] Select 13 (MW) ==================================================================== Test                             Result       Flag       Units  Drug Present and Declared for Prescription Verification   Lorazepam                      796          EXPECTED   ng/mg creat    Source of lorazepam is a scheduled prescription medication.    Buprenorphine                  3            EXPECTED   ng/mg creat    Sources of buprenorphine include scheduled prescription medications.  Drug Absent but Declared for Prescription Verification   Tramadol                       Not Detected UNEXPECTED ng/mg creat ==================================================================== Test                      Result    Flag   Units      Ref Range   Creatinine              156              mg/dL      >=20 ==================================================================== Declared Medications:  The flagging and interpretation  on this report are based on the  following declared medications.  Unexpected results may arise from  inaccuracies in the declared medications.   **Note: The testing scope of this panel includes these medications:   Lorazepam (Ativan)  Tramadol (Ultram)   **Note: The testing scope of this panel does not include small to  moderate amounts of these reported medications:   Buprenorphine Patch (BuTrans)   **Note: The testing scope of this panel does not include the  following reported medications:   Acetaminophen (Tylenol)  Albuterol (Ventolin HFA)  Amlodipine (Norvasc)  Azelastine  Diclofenac (Voltaren)  Dorzolamide  Duloxetine (Cymbalta)  Fenofibrate (TriCor)  Fluticasone (Flonase)  Hydroxyzine (Atarax)  Ibuprofen (Advil)  Iron  Montelukast (Singulair)  Multivitamin  Mupirocin (Bactroban)  Pantoprazole  (Protonix)  Prednisolone  Quetiapine (Seroquel)  Risperidone (Risperdal)  Tamsulosin (Flomax)  Trazodone (Desyrel) ==================================================================== For clinical consultation, please call [phone]. ====================================================================    No results found for: "CBDTHCR", "D8THCCBX", "D9THCCBX"   Laboratory Chemistry Profile   Renal Lab Results  Component Value Date   BUN 23 04/30/2022   CREATININE 1.22 04/30/2022   LABCREA 631 12/13/2019   BCR 28 (H) 07/10/2018   GFRAA >60 02/09/2020   GFRNONAA 58 (L) 04/30/2022    Hepatic Lab Results  Component Value Date   AST 20 04/30/2022   ALT 11 04/30/2022   ALBUMIN 3.7 04/30/2022   ALKPHOS 71 04/30/2022   LIPASE 24 01/22/2022   AMMONIA 25 09/13/2019    Electrolytes Lab Results  Component Value Date   NA 140 04/30/2022   K 4.5 04/30/2022   CL 112 (H) 04/30/2022   CALCIUM 8.7 (L) 04/30/2022   MG 2.0 01/22/2022   PHOS 3.4 03/30/2019    Bone Lab Results  Component Value Date   VD25OH 26 08/15/2016   25OHVITD1 34 07/10/2018   25OHVITD2 <1.0 07/10/2018   25OHVITD3 34 07/10/2018    Inflammation (CRP: Acute Phase) (ESR: Chronic Phase) Lab Results  Component Value Date   CRP 5 07/10/2018   ESRSEDRATE 20 07/10/2018   LATICACIDVEN 0.9 01/29/2022         Note: Above Lab results reviewed.  Imaging  CT Angio Chest PE W and/or Wo Contrast CLINICAL DATA:  Short of breath, anxious  EXAM: CT ANGIOGRAPHY CHEST WITH CONTRAST  TECHNIQUE: Multidetector CT imaging of the chest was performed using the standard protocol during bolus administration of intravenous contrast. Multiplanar CT image reconstructions and MIPs were obtained to evaluate the vascular anatomy.  RADIATION DOSE REDUCTION: This exam was performed according to the departmental dose-optimization program which includes automated exposure control, adjustment of the mA and/or kV according  to patient size and/or use of iterative reconstruction technique.  CONTRAST:  86m OMNIPAQUE IOHEXOL 350 MG/ML SOLN  COMPARISON:  04/30/2022, 12/26/2019  FINDINGS: Cardiovascular: This is a technically adequate evaluation of the pulmonary vasculature. No filling defects or pulmonary emboli.  The heart is unremarkable without pericardial effusion. No evidence of thoracic aortic aneurysm or dissection. Atherosclerosis of the aorta and coronary vasculature.  Mediastinum/Nodes: No enlarged mediastinal, hilar, or axillary lymph nodes. Thyroid gland, trachea, and esophagus demonstrate no significant findings.  Lungs/Pleura: Trace bilateral pleural effusions. No acute airspace disease or pneumothorax. Stable bibasilar bronchiectasis and chronic scarring within the lingula. The lingular nodule seen on prior CT exams has resolved in the interim. Central airways are patent.  Upper Abdomen: No acute abnormality.  Musculoskeletal: No acute or destructive bony lesions. Exaggerated thoracic kyphosis unchanged. Reconstructed images demonstrate no additional findings.  Review  of the MIP images confirms the above findings.  IMPRESSION: 1. No evidence of pulmonary embolus. 2. Trace bilateral pleural effusions. 3. Chronic bibasilar bronchiectasis and lingular scarring. No acute airspace disease. 4.  Aortic Atherosclerosis (ICD10-I70.0).  Electronically Signed   By: Randa Ngo M.D.   On: 04/30/2022 17:00 DG Chest 2 View CLINICAL DATA:  Shortness of breath  EXAM: CHEST - 2 VIEW  COMPARISON:  03/15/2022  FINDINGS: The heart size and mediastinal contours are within normal limits. Both lungs are clear. Exaggerated thoracic kyphosis with ankylosis throughout.  IMPRESSION: 1. No acute abnormality of the lungs. 2. Exaggerated thoracic kyphosis with ankylosis throughout.  Electronically Signed   By: Delanna Ahmadi M.D.   On: 04/30/2022 12:51  Assessment  The primary encounter  diagnosis was Chronic pain syndrome. Diagnoses of Chronic knee pain (1ry area of Pain) (Bilateral) (L>R), Osteoarthritis of knee (Bilateral), Chronic knee pain (Left), Osteoarthritis of knee (Left), Lateral meniscal tear, sequela (Left), Medial meniscal tear, sequela (Left), Tricompartment osteoarthritis of knee (Left), Osteoarthritis of patellofemoral joint (Right), Patellar tendinosis (Right), Chronic low back pain (Bilateral) w/o sciatica, DISH (diffuse idiopathic skeletal hyperostosis), Unable to maintain body in lying position, Pharmacologic therapy, Chronic use of opiate for therapeutic purpose, Encounter for medication management, and Encounter for chronic pain management were also pertinent to this visit.  Plan of Care  Problem-specific:  No problem-specific Assessment & Plan notes found for this encounter.  Aaron Gibbs has a current medication list which includes the following long-term medication(s): amitriptyline, bumetanide, buprenorphine, carvedilol, cetirizine, duloxetine, gabapentin, losartan, olanzapine, pantoprazole, trazodone, [START ON 06/21/2022] buprenorphine, [START ON 07/19/2022] buprenorphine, [START ON 08/16/2022] buprenorphine, diclofenac sodium, and tramadol.  Pharmacotherapy (Medications Ordered): Meds ordered this encounter  Medications   buprenorphine (BUTRANS) 10 MCG/HR PTWK    Sig: Place 1 patch onto the skin once a week for 28 days. Apply only 1 patch at a time and alternate sites weekly.    Dispense:  4 patch    Refill:  0    Chronic Pain: STOP Act (Not applicable) Fill 1 day early if closed on refill date. Avoid benzodiazepines within 8 hours of opioids   buprenorphine (BUTRANS) 10 MCG/HR PTWK    Sig: Place 1 patch onto the skin once a week for 28 days. Apply only 1 patch at a time and alternate sites weekly.    Dispense:  4 patch    Refill:  0    Chronic Pain: STOP Act (Not applicable) Fill 1 day early if closed on refill date. Avoid benzodiazepines within  8 hours of opioids   buprenorphine (BUTRANS) 10 MCG/HR PTWK    Sig: Place 1 patch onto the skin once a week for 28 days. Apply only 1 patch at a time and alternate sites weekly.    Dispense:  4 patch    Refill:  0    Chronic Pain: STOP Act (Not applicable) Fill 1 day early if closed on refill date. Avoid benzodiazepines within 8 hours of opioids   Orders:  No orders of the defined types were placed in this encounter.  Follow-up plan:   Return in about 16 weeks (around 09/13/2022) for Eval-day (M,W), (F2F), (MM).     Interventional Therapies  Risk Factors  Considerations:  Patient is legally blind. High risk for substance use disorder.  The patient has documented suicidal attempt using medications.  In addition, we have documented the patient to have a binge use pattern and been noncompliant with the instructions on how to use  the medications.  This is a reason why went to a buprenorphine patch.   Planned  Pending:   Repeat bilateral genicular nerve RFA #3    Under consideration:   Palliative bilateral genicular nerve RFA maintenance treatments   Completed:   Palliative bilateral IA Hyalgan knee injections Sx2  Diagnostic bilateral genicular NB x1  Palliative right genicular nerve RFA x2 (04/06/2020) (100/100/50/100)  Palliative left genicular nerve RFA x3 (09/28/2020) (100/50/50/90)    Completed by other providers:   N/A   Therapeutic  Palliative (PRN) options:   Palliative bilateral IA Hyalgan knee injections S3/N1  Diagnostic bilateral genicular NB #2  Palliative bilateral genicular nerve RFA #3    Pharmacotherapy  Nonopioid transferred 11/02/2020: Voltaren gel      Recent Visits Date Type Provider Dept  04/18/22 Procedure visit Milinda Pointer, MD [facility]  03/29/22 Office Visit Milinda Pointer, MD [facility]  02/27/22 Office Visit Milinda Pointer, MD [facility]  Showing recent visits within past 90 days and meeting all  other requirements Today's Visits Date Type Provider Dept  05/24/22 Office Visit Milinda Pointer, MD [facility]  Showing today's visits and meeting all other requirements Future Appointments Date Type Provider Dept  06/21/22 Appointment Milinda Pointer, MD [facility]  Showing future appointments within next 90 days and meeting all other requirements  I discussed the assessment and treatment plan with the patient. The patient was provided an opportunity to ask questions and all were answered. The patient agreed with the plan and demonstrated an understanding of the instructions.  Patient advised to call back or seek an in-person evaluation if the symptoms or condition worsens.  Duration of encounter: 15 minutes.  Note by: Gaspar Cola, MD Date: 05/24/2022; Time: 3:43 PM

## 2022-05-24 ENCOUNTER — Ambulatory Visit: Payer: Medicare Other | Attending: Pain Medicine | Admitting: Pain Medicine

## 2022-05-24 DIAGNOSIS — M4810 Ankylosing hyperostosis [Forestier], site unspecified: Secondary | ICD-10-CM

## 2022-05-24 DIAGNOSIS — S83242S Other tear of medial meniscus, current injury, left knee, sequela: Secondary | ICD-10-CM | POA: Diagnosis not present

## 2022-05-24 DIAGNOSIS — S83282S Other tear of lateral meniscus, current injury, left knee, sequela: Secondary | ICD-10-CM | POA: Diagnosis not present

## 2022-05-24 DIAGNOSIS — Z79891 Long term (current) use of opiate analgesic: Secondary | ICD-10-CM

## 2022-05-24 DIAGNOSIS — R6889 Other general symptoms and signs: Secondary | ICD-10-CM

## 2022-05-24 DIAGNOSIS — G8929 Other chronic pain: Secondary | ICD-10-CM

## 2022-05-24 DIAGNOSIS — G894 Chronic pain syndrome: Secondary | ICD-10-CM | POA: Diagnosis not present

## 2022-05-24 DIAGNOSIS — M1712 Unilateral primary osteoarthritis, left knee: Secondary | ICD-10-CM

## 2022-05-24 DIAGNOSIS — M7650 Patellar tendinitis, unspecified knee: Secondary | ICD-10-CM

## 2022-05-24 DIAGNOSIS — M170 Bilateral primary osteoarthritis of knee: Secondary | ICD-10-CM | POA: Diagnosis not present

## 2022-05-24 DIAGNOSIS — Z79899 Other long term (current) drug therapy: Secondary | ICD-10-CM

## 2022-05-24 DIAGNOSIS — M545 Low back pain, unspecified: Secondary | ICD-10-CM

## 2022-05-24 DIAGNOSIS — M1711 Unilateral primary osteoarthritis, right knee: Secondary | ICD-10-CM

## 2022-05-24 MED ORDER — BUPRENORPHINE 10 MCG/HR TD PTWK: 1.0000 | MEDICATED_PATCH | TRANSDERMAL | 0 refills | Status: DC

## 2022-05-24 NOTE — Patient Instructions (Signed)
_______________________________________________________________________  Medication Rules  Purpose: To inform patients, and their family members, of our medication rules and regulations.  Applies to: All patients receiving prescriptions from our practice (written or electronic).  Pharmacy of record: This is the pharmacy where your electronic prescriptions will be sent. Make sure we have the correct one.  Electronic prescriptions: In compliance with the [place] Strengthen Opioid Misuse Prevention (STOP) Act of 2017 (Session Law 2017-74/H243), effective June 19, 2018, all controlled substances must be electronically prescribed. Written prescriptions, faxing, or calling prescriptions to a pharmacy will no longer be done.  Prescription refills: These will be provided only during in-person appointments. No medications will be renewed without a "face-to-face" evaluation with your provider. Applies to all prescriptions.  NOTE: The following applies primarily to controlled substances (Opioid* Pain Medications).   Type of encounter (visit): For patients receiving controlled substances, face-to-face visits are required. (Not an option and not up to the patient.)  Patient's responsibilities: Pain Pills: Bring all pain pills to every appointment (except for procedure appointments). Pill Bottles: Bring pills in original pharmacy bottle. Bring bottle, even if empty. Always bring the bottle of the most recent fill.  Medication refills: You are responsible for knowing and keeping track of what medications you are taking and when is it that you will need a refill. The day before your appointment: write a list of all prescriptions that need to be refilled. The day of the appointment: give the list to the admitting nurse. Prescriptions will be written only during appointments. No prescriptions will be written on procedure days. If you forget a medication: it will not be "Called in", "Faxed", or  "electronically sent". You will need to get another appointment to get these prescribed. No early refills. Do not call asking to have your prescription filled early. Partial  or short prescriptions: Occasionally your pharmacy may not have enough pills to fill your prescription.  NEVER ACCEPT a partial fill or a prescription that is short of the total amount of pills that you were prescribed.  With controlled substances the law allows 72 hours for the pharmacy to complete the prescription.  If the prescription is not completed within 72 hours, the pharmacist will require a new prescription to be written. This means that you will be short on your medicine and we WILL NOT send another prescription to complete your original prescription.  Instead, request the pharmacy to send a carrier to a nearby branch to get enough medication to provide you with your full prescription. Prescription Accuracy: You are responsible for carefully inspecting your prescriptions before leaving our office. Have the discharge nurse carefully go over each prescription with you, before taking them home. Make sure that your name is accurately spelled, that your address is correct. Check the name and dose of your medication to make sure it is accurate. Check the number of pills, and the written instructions to make sure they are clear and accurate. Make sure that you are given enough medication to last until your next medication refill appointment. Taking Medication: Take medication as prescribed. When it comes to controlled substances, taking less pills or less frequently than prescribed is permitted and encouraged. Never take more pills than instructed. Never take the medication more frequently than prescribed.  Inform other Doctors: Always inform, all of your healthcare providers, of all the medications you take. Pain Medication from other Providers: You are not allowed to accept any additional pain medication from any other Doctor or  Healthcare provider. There are   two exceptions to this rule. (see below) In the event that you require additional pain medication, you are responsible for notifying us, as stated below. Cough Medicine: Often these contain an opioid, such as codeine or hydrocodone. Never accept or take cough medicine containing these opioids if you are already taking an opioid* medication. The combination may cause respiratory failure and death. Medication Agreement: You are responsible for carefully reading and following our Medication Agreement. This must be signed before receiving any prescriptions from our practice. Safely store a copy of your signed Agreement. Violations to the Agreement will result in no further prescriptions. (Additional copies of our Medication Agreement are available upon request.) Laws, Rules, & Regulations: All patients are expected to follow all Federal and [employer], Rules, [employer]. Ignorance of the Laws does not constitute a valid excuse.  Illegal drugs and Controlled Substances: The use of illegal substances (including, but not limited to marijuana and its derivatives) and/or the illegal use of any controlled substances is strictly prohibited. Violation of this rule may result in the immediate and permanent discontinuation of any and all prescriptions being written by our practice. The use of any illegal substances is prohibited. Adopted CDC guidelines & recommendations: Target dosing levels will be at or below 60 MME/day. Use of benzodiazepines** is not recommended.  Exceptions: There are only two exceptions to the rule of not receiving pain medications from other Healthcare Providers. Exception #1 (Emergencies): In the event of an emergency (i.e.: accident requiring emergency care), you are allowed to receive additional pain medication. However, you are responsible for: As soon as you are able, call our office [phone], at any time of the day or night, and leave a  message stating your name, the date and nature of the emergency, and the name and dose of the medication prescribed. In the event that your call is answered by a member of our staff, make sure to document and save the date, time, and the name of the person that took your information.  Exception #2 (Planned Surgery): In the event that you are scheduled by another doctor or dentist to have any type of surgery or procedure, you are allowed (for a period no longer than 30 days), to receive additional pain medication, for the acute post-op pain. However, in this case, you are responsible for picking up a copy of our "Post-op Pain Management for Surgeons" handout, and giving it to your surgeon or dentist. This document is available at our office, and does not require an appointment to obtain it. Simply go to our office during business hours (Monday-Thursday from 8:00 AM to 4:00 PM) (Friday 8:00 AM to 12:00 Noon) or if you have a scheduled appointment with [place], prior to your surgery, and ask for it by name. In addition, you are responsible for: calling our office [phone], at any time of the day or night, and leaving a message stating your name, name of your surgeon, type of surgery, and date of procedure or surgery. Failure to comply with your responsibilities may result in termination of therapy involving the controlled substances. Medication Agreement Violation. Following the above rules, including your responsibilities will help you in avoiding a Medication Agreement Violation ("Breaking your Pain Medication Contract").  Consequences:  Not following the above rules may result in permanent discontinuation of medication prescription therapy.  *Opioid medications include: morphine, codeine, oxycodone, oxymorphone, hydrocodone, hydromorphone, meperidine, tramadol, tapentadol, buprenorphine, fentanyl, methadone. **Benzodiazepine medications include: diazepam (Valium), alprazolam (Xanax), clonazepam (Klonopine),  lorazepam (  Ativan), clorazepate (Tranxene), chlordiazepoxide (Librium), estazolam (Prosom), oxazepam (Serax), temazepam (Restoril), triazolam (Halcion) (Last updated: 04/11/2022) ______________________________________________________________________    ______________________________________________________________________  Medication Recommendations and Reminders  Applies to: All patients receiving prescriptions (written and/or electronic).  Medication Rules & Regulations: You are responsible for reading, knowing, and following our "Medication Rules" document. These exist for your safety and that of others. They are not flexible and neither are we. Dismissing or ignoring them is an act of "non-compliance" that may result in complete and irreversible termination of such medication therapy. For safety reasons, "non-compliance" will not be tolerated. As with the [place] fundamental legal principle of "ignorance of the law is no defense", we will accept no excuses for not having read and knowing the content of documents provided to you by our practice.  Pharmacy of record:  Definition: This is the pharmacy where your electronic prescriptions will be sent.  We do not endorse any particular pharmacy. It is up to you and your insurance to decide what pharmacy to use.  We do not restrict you in your choice of pharmacy. However, once we write for your prescriptions, we will NOT be re-sending more prescriptions to fix restricted supply problems created by your pharmacy, or your insurance.  The pharmacy listed in the electronic medical record should be the one where you want electronic prescriptions to be sent. If you choose to change pharmacy, simply notify our nursing staff. Changes will be made only during your regular appointments and not over the phone.  Recommendations: Keep all of your pain medications in a safe place, under lock and key, even if you live alone. We will NOT replace lost, stolen, or  damaged medication. We do not accept "Police Reports" as proof of medications having been stolen. After you fill your prescription, take 1 week's worth of pills and put them away in a safe place. You should keep a separate, properly labeled bottle for this purpose. The remainder should be kept in the original bottle. Use this as your primary supply, until it runs out. Once it's gone, then you know that you have 1 week's worth of medicine, and it is time to come in for a prescription refill. If you do this correctly, it is unlikely that you will ever run out of medicine. To make sure that the above recommendation works, it is very important that you make sure your medication refill appointments are scheduled at least 1 week before you run out of medicine. To do this in an effective manner, make sure that you do not leave the office without scheduling your next medication management appointment. Always ask the nursing staff to show you in your prescription , when your medication will be running out. Then arrange for the receptionist to get you a return appointment, at least 7 days before you run out of medicine. Do not wait until you have 1 or 2 pills left, to come in. This is very poor planning and does not take into consideration that we may need to cancel appointments due to bad weather, sickness, or emergencies affecting our staff. DO NOT ACCEPT A "Partial Fill": If for any reason your pharmacy does not have enough pills/tablets to completely fill or refill your prescription, do not allow for a "partial fill". The law allows the pharmacy to complete that prescription within 72 hours, without requiring a new prescription. If they do not fill the rest of your prescription within those 72 hours, you will need a separate prescription to fill the remaining  amount, which we will NOT provide. If the reason for the partial fill is your insurance, you will need to talk to the pharmacist about payment alternatives for  the remaining tablets, but again, DO NOT ACCEPT A PARTIAL FILL, unless you can trust your pharmacist to obtain the remainder of the pills within 72 hours.  Prescription refills and/or changes in medication(s):  Prescription refills, and/or changes in dose or medication, will be conducted only during scheduled medication management appointments. (Applies to both, written and electronic prescriptions.) No refills on procedure days. No medication will be changed or started on procedure days. No changes, adjustments, and/or refills will be conducted on a procedure day. Doing so will interfere with the diagnostic portion of the procedure. No phone refills. No medications will be "called into the pharmacy". No Fax refills. No weekend refills. No Holliday refills. No after hours refills.  Remember:  Business hours are:  Monday to Thursday 8:00 AM to 4:00 PM Provider's Schedule: Milinda Pointer, MD - Appointments are:  Medication management: Monday and Wednesday 8:00 AM to 4:00 PM Procedure day: Tuesday and Thursday 7:30 AM to 4:00 PM Gillis Santa, MD - Appointments are:  Medication management: Tuesday and Thursday 8:00 AM to 4:00 PM Procedure day: Monday and Wednesday 7:30 AM to 4:00 PM (Last update: 04/11/2022) ______________________________________________________________________    ____________________________________________________________________________________________  Patient Information update  To: All of our patients.  Re: Name change.  It has been made official that our current name, "[place]"   will soon be changed to "[facility]".   The purpose of this change is to eliminate any confusion created by the concept of our practice being a "Medication Management Pain Clinic". In the past this has led to the misconception that we treat pain primarily by the use of  prescription medications.  Nothing can be farther from the truth.   Understanding PAIN MANAGEMENT: To further understand what our practice does, you first have to understand that "Pain Management" is a subspecialty that requires additional training once a physician has completed their specialty training, which can be in either Anesthesia, Neurology, Psychiatry, or Physical Medicine and Rehabilitation (PMR). Each one of these contributes to the final approach taken by each physician to the management of their patient's pain. To be a "Pain Management Specialist" you must have first completed one of the specialty trainings below.  Anesthesiologists - trained in clinical pharmacology and interventional techniques such as nerve blockade and regional as well as central neuroanatomy. They are trained to block pain before, during, and after surgical interventions.  Neurologists - trained in the diagnosis and pharmacological treatment of complex neurological conditions, such as Multiple Sclerosis, Parkinson's, spinal cord injuries, and other systemic conditions that may be associated with symptoms that may include but are not limited to pain. They tend to rely primarily on the treatment of chronic pain using prescription medications.  Psychiatrist - trained in conditions affecting the psychosocial wellbeing of patients including but not limited to depression, anxiety, schizophrenia, personality disorders, addiction, and other substance use disorders that may be associated with chronic pain. They tend to rely primarily on the treatment of chronic pain using prescription medications.   Physical Medicine and Rehabilitation (PMR) physicians, also known as physiatrists - trained to treat a wide variety of medical conditions affecting the brain, spinal cord, nerves, bones, joints, ligaments, muscles, and tendons. Their training is primarily aimed at treating patients that have suffered injuries that  have caused severe  physical impairment. Their training is primarily aimed at the physical therapy and rehabilitation of those patients. They may also work alongside orthopedic surgeons or neurosurgeons using their expertise in assisting surgical patients to recover after their surgeries.  INTERVENTIONAL PAIN MANAGEMENT is sub-subspecialty of Pain Management.  Our physicians are Board-certified in Anesthesia, Pain Management, and Interventional Pain Management.  This meaning that not only have they been trained and Board-certified in their specialty of Anesthesia, and subspecialty of Pain Management, but they have also received further training in the sub-subspecialty of Interventional Pain Management, in order to become Board-certified as INTERVENTIONAL PAIN MANAGEMENT SPECIALIST.    Mission: Our goal is to use our skills in  [place] as alternatives to the chronic use of prescription opioid medications for the treatment of pain. To make this more clear, we have changed our name to reflect what we do and offer. We will continue to offer medication management assessment and recommendations, but we will not be taking over any patient's medication management.  ____________________________________________________________________________________________

## 2022-05-26 ENCOUNTER — Ambulatory Visit: Payer: Self-pay | Admitting: *Deleted

## 2022-05-26 NOTE — Telephone Encounter (Signed)
Reason for Disposition  [1] SEVERE anxiety (e.g., extremely anxious with intense emotional symptoms such as feeling of unreality, urge to flee, unable to calm down; unable to cope or function) AND [2] not better after 10 minutes of reassurance and Care Advice  Answer Assessment - Initial Assessment Questions 1. CONCERN: "Did anything happen that prompted you to call today?"      I'm having anxiety.   No reason for the anxiety today it just happened. 2. ANXIETY SYMPTOMS: "Can you describe how you (your loved one; patient) have been feeling?" (e.g., tense, restless, panicky, anxious, keyed up, overwhelmed, sense of impending doom).      Sweating, heart beating fast, trouble breathing.   No chest pain.   No history of cardiac issues.   3. ONSET: "How long have you been feeling this way?" (e.g., hours, days, weeks)     Today 4. SEVERITY: "How would you rate the level of anxiety?" (e.g., 0 - 10; or mild, moderate, severe).     Severe 5. FUNCTIONAL IMPAIRMENT: "How have these feelings affected your ability to do daily activities?" "Have you had more difficulty than usual doing your normal daily activities?" (e.g., getting better, same, worse; self-care, school, work, interactions)     No medical 6. HISTORY: "Have you felt this way before?" "Have you ever been diagnosed with an anxiety problem in the past?" (e.g., generalized anxiety disorder, panic attacks, PTSD). If Yes, ask: "How was this problem treated?" (e.g., medicines, counseling, etc.)     Yes 7. RISK OF HARM - SUICIDAL IDEATION: "Do you ever have thoughts of hurting or killing yourself?" If Yes, ask:  "Do you have these feelings now?" "Do you have a plan on how you would do this?"     No 8. TREATMENT:  "What has been done so far to treat this anxiety?" (e.g., medicines, relaxation strategies). "What has helped?"     Yes in the past 9. TREATMENT - THERAPIST: "Do you have a counselor or therapist? Name?"     Not asked 10. POTENTIAL TRIGGERS:  "Do you drink caffeinated beverages (e.g., coffee, colas, teas), and how much daily?" "Do you drink alcohol or use any drugs?" "Have you started any new medicines recently?"       No one here with me.   11. PATIENT SUPPORT: "Who is with you now?" "Who do you live with?" "Do you have family or friends who you can talk to?"        No one 12. OTHER SYMPTOMS: "Do you have any other symptoms?" (e.g., feeling depressed, trouble concentrating, trouble sleeping, trouble breathing, palpitations or fast heartbeat, chest pain, sweating, nausea, or diarrhea)       See above 13. PREGNANCY: "Is there any chance you are pregnant?" "When was your last menstrual period?"       N/A  Protocols used: Anxiety and Panic Attack-A-AH  Chief Complaint: Anxiety attack Symptoms: Heart racing, trouble breathing, sweating.   "I've had this before" when asked about a history of anxiety.  Frequency: Nothing in particular triggered the anxiety today when asked Pertinent Negatives: Patient denies having suicidal thoughts. Disposition: '[x]'$ ED /'[]'$ Urgent Care (no appt availability in office) / '[]'$ Appointment(In office/virtual)/ '[]'$  [facility]/ '[]'$ Home Care/ '[]'$ Refused Recommended Disposition /'[]'$ [facility]/ '[]'$  Follow-up with PCP Additional Notes: I let him know he needed to go to the ED since he is having a severe anxiety attack and does not have medication to take for it.  He said,  "I will consider  that".   I asked him to let me make him an appt. With Dr. Parks Ranger so he could be seen and treated.   The next appt. Was Dec. 12.   He hung up while I was looking for an appt.  I sent this information to [facility] for Dr. Parks Ranger.   He did not commit to going to the ED but "would consider that".

## 2022-05-27 ENCOUNTER — Other Ambulatory Visit: Payer: Self-pay

## 2022-05-27 ENCOUNTER — Emergency Department: Payer: Medicare Other

## 2022-05-27 ENCOUNTER — Emergency Department
Admission: EM | Admit: 2022-05-27 | Discharge: 2022-05-27 | Disposition: A | Discharge status: Home / Self Care | Payer: Medicare Other | Attending: Emergency Medicine | Admitting: Emergency Medicine

## 2022-05-27 DIAGNOSIS — R0602 Shortness of breath: Secondary | ICD-10-CM | POA: Diagnosis present

## 2022-05-27 DIAGNOSIS — F419 Anxiety disorder, unspecified: Secondary | ICD-10-CM | POA: Diagnosis not present

## 2022-05-27 DIAGNOSIS — Z1152 Encounter for screening for COVID-19: Secondary | ICD-10-CM | POA: Diagnosis not present

## 2022-05-27 DIAGNOSIS — I110 Hypertensive heart disease with heart failure: Secondary | ICD-10-CM | POA: Insufficient documentation

## 2022-05-27 DIAGNOSIS — I509 Heart failure, unspecified: Secondary | ICD-10-CM | POA: Diagnosis not present

## 2022-05-27 DIAGNOSIS — I10 Essential (primary) hypertension: Secondary | ICD-10-CM

## 2022-05-27 LAB — BASIC METABOLIC PANEL
Anion gap: 5 (ref 5–15)
BUN: 19 mg/dL (ref 8–23)
CO2: 25 mmol/L (ref 22–32)
Calcium: 9 mg/dL (ref 8.9–10.3)
Chloride: 114 mmol/L — ABNORMAL HIGH (ref 98–111)
Creatinine, Ser: 1.12 mg/dL (ref 0.61–1.24)
GFR, Estimated: >60 mL/min (ref >60)
Glucose, Bld: 132 mg/dL — ABNORMAL HIGH (ref 70–99)
Potassium: 4.3 mmol/L (ref 3.5–5.1)
Sodium: 144 mmol/L (ref 135–145)

## 2022-05-27 LAB — CBC WITH DIFFERENTIAL/PLATELET
Abs Immature Granulocytes: 0.17 10*3/uL — ABNORMAL HIGH (ref 0.00–0.07)
Basophils Absolute: 0 10*3/uL (ref 0.0–0.1)
Basophils Relative: 1 %
Eosinophils Absolute: 0.2 10*3/uL (ref 0.0–0.5)
Eosinophils Relative: 6 %
HCT: 33.6 % — ABNORMAL LOW (ref 39.0–52.0)
Hemoglobin: 9.8 g/dL — ABNORMAL LOW (ref 13.0–17.0)
Immature Granulocytes: 6 %
Lymphocytes Relative: 26 %
Lymphs Abs: 0.8 10*3/uL (ref 0.7–4.0)
MCH: 24.3 pg — ABNORMAL LOW (ref 26.0–34.0)
MCHC: 29.2 g/dL — ABNORMAL LOW (ref 30.0–36.0)
MCV: 83.2 fL (ref 80.0–100.0)
Monocytes Absolute: 0.6 10*3/uL (ref 0.1–1.0)
Monocytes Relative: 21 %
Neutro Abs: 1.2 10*3/uL — ABNORMAL LOW (ref 1.7–7.7)
Neutrophils Relative %: 40 %
Platelets: 131 10*3/uL — ABNORMAL LOW (ref 150–400)
RBC: 4.04 MIL/uL — ABNORMAL LOW (ref 4.22–5.81)
RDW: 18 % — ABNORMAL HIGH (ref 11.5–15.5)
Smear Review: NORMAL
WBC: 2.9 10*3/uL — ABNORMAL LOW (ref 4.0–10.5)
nRBC: 0 % (ref 0.0–0.2)

## 2022-05-27 LAB — RESP PANEL BY RT-PCR (RSV, FLU A&B, COVID)  RVPGX2
Influenza A by PCR: NEGATIVE
Influenza B by PCR: NEGATIVE
Resp Syncytial Virus by PCR: NEGATIVE
SARS Coronavirus 2 by RT PCR: NEGATIVE

## 2022-05-27 LAB — TROPONIN I (HIGH SENSITIVITY)
Troponin I (High Sensitivity): 13 ng/L (ref <18)
Troponin I (High Sensitivity): 13 ng/L (ref <18)

## 2022-05-27 LAB — BRAIN NATRIURETIC PEPTIDE: B Natriuretic Peptide: 156 pg/mL — ABNORMAL HIGH (ref 0.0–100.0)

## 2022-05-27 MED ORDER — ACETAMINOPHEN 500 MG PO TABS: 1000.0000 mg | ORAL_TABLET | Freq: Once | ORAL | Status: DC

## 2022-05-27 MED ORDER — LORAZEPAM 1 MG PO TABS
0.5000 mg | ORAL_TABLET | ORAL | Status: AC
  Administered 2022-05-27: 0.5 mg via ORAL
  Filled 2022-05-27: qty 1

## 2022-05-27 MED ORDER — LORAZEPAM 2 MG/ML IJ SOLN
1.0000 mg | Freq: Once | INTRAMUSCULAR | Status: AC
  Administered 2022-05-27: 1 mg via INTRAVENOUS
  Filled 2022-05-27: qty 1

## 2022-05-27 MED ORDER — IOHEXOL 350 MG/ML SOLN
100.0000 mL | Freq: Once | INTRAVENOUS | Status: AC | PRN
  Administered 2022-05-27: 100 mL via INTRAVENOUS

## 2022-05-27 NOTE — Discharge Instructions (Addendum)
Your lab tests and CT scan of the chest are all okay today.  Continue taking all of your home medications.  Be sure to drink an adequate amount of fluids each day to stay hydrated.

## 2022-05-27 NOTE — ED Provider Notes (Addendum)
[facility] Provider Note    Event Date/Time   First MD Initiated Contact with Patient 05/27/22 [phone]     (approximate)   History   Chief Complaint: Shortness of Breath   HPI  Aaron Gibbs is a 87 y.o. male with a history of COPD, CHF, anxiety, chronic pain who comes ED complaining of shortness of breath for the past 2 or 3 days, feels like his heart is going fast.  Also reports some vague chest discomfort described as pressure which is intermittent, not exertional, not pleuritic.  Patient reports decreased oral intake but still tolerating p.o.  Still taking all his medications including his diuretic as prescribed.     Physical Exam   Triage Vital Signs: ED Triage Vitals  Enc Vitals Group     BP 05/27/22 0755 (!) 192/118     Pulse Rate 05/27/22 0755 80     Resp 05/27/22 0755 20     Temp 05/27/22 0755 98.8 F (37.1 C)     Temp Source 05/27/22 0755 Oral     SpO2 05/27/22 0755 95 %     Weight 05/27/22 0757 240 lb (108.9 kg)     Height 05/27/22 0757 '5\' 11"'$  (1.803 m)     Head Circumference --      Peak Flow --      Pain Score 05/27/22 0756 7     Pain Loc --      Pain Edu? --      Excl. in [place]? --     Most recent vital signs: Vitals:   05/27/22 1131 05/27/22 1217  BP: (!) 196/91   Pulse: 85   Resp: 20   Temp: 98.4 F (36.9 C) 98.5 F (36.9 C)  SpO2: 96%     General: Awake, no distress.  CV:  Good peripheral perfusion.  Regular rate and rhythm.  Normal distal pulses. Resp:  Normal effort.  Clear to auscultation bilaterally. Abd:  No distention.  Soft nontender Other:  Trace edema bilateral lower extremities, symmetric, no calf tenderness.   ED Results / Procedures / Treatments   Labs (all labs ordered are listed, but only abnormal results are displayed) Labs Reviewed  BRAIN NATRIURETIC PEPTIDE - Abnormal; Notable for the following components:      Result Value   B Natriuretic Peptide 156.0 (*)    All other components within  normal limits  BASIC METABOLIC PANEL - Abnormal; Notable for the following components:   Chloride 114 (*)    Glucose, Bld 132 (*)    All other components within normal limits  CBC WITH DIFFERENTIAL/PLATELET - Abnormal; Notable for the following components:   WBC 2.9 (*)    RBC 4.04 (*)    Hemoglobin 9.8 (*)    HCT 33.6 (*)    MCH 24.3 (*)    MCHC 29.2 (*)    RDW 18.0 (*)    Platelets 131 (*)    Neutro Abs 1.2 (*)    Abs Immature Granulocytes 0.17 (*)    All other components within normal limits  RESP PANEL BY RT-PCR (RSV, FLU A&B, COVID)  [id number]  TROPONIN I (HIGH SENSITIVITY)  TROPONIN I (HIGH SENSITIVITY)     EKG Interpreted by me Sinus rhythm, rate of 84.  Normal axis.  First-degree AV block with PR interval '2 2 6 '$ ms.  Normal QRS ST segments and T waves.   RADIOLOGY  Chest x-ray interpreted by me, appears normal.  Radiology report reviewed.   PROCEDURES:  Procedures   MEDICATIONS ORDERED IN ED: Medications  LORazepam (ATIVAN) tablet 0.5 mg (0.5 mg Oral Given 05/27/22 0914)  iohexol (OMNIPAQUE) 350 MG/ML injection 100 mL (100 mLs Intravenous Contrast Given 05/27/22 1037)  LORazepam (ATIVAN) injection 1 mg (1 mg Intravenous Given 05/27/22 1244)     IMPRESSION / MDM / ASSESSMENT AND PLAN / ED COURSE  I reviewed the triage vital signs and the nursing notes.                              Differential diagnosis includes, but is not limited to, pneumonia, pleural effusion, pulmonary edema, non-STEMI, pulmonary embolism  Patient's presentation is most consistent with acute presentation with potential threat to life or bodily function.  Patient presents with shortness of breath, most likely viral illness versus anxiety.  However due to his comorbidities and age, workup is needed.  Initial labs and chest x-ray are unremarkable, viral swab negative.  Will obtain CT angiogram to rule out PE.   ----------------------------------------- 12:13 PM on  05/27/2022 ----------------------------------------- CT scan negative.  Repeat troponin negative.  Does not require admission with very reassuring workup.  Prevailing impression is viral illness versus anxiety.  Patient has uncontrolled hypertension.  Has not taken his medications yet today.  Will resume his medications and follow-up with primary care in the next week.  Stable for discharge.      FINAL CLINICAL IMPRESSION(S) / ED DIAGNOSES   Final diagnoses:  SOB (shortness of breath)  Chronic congestive heart failure, unspecified heart failure type ([place])  Uncontrolled hypertension  Anxiety     Rx / DC Orders   ED Discharge Orders     None        Note:  This document was prepared using Dragon voice recognition software and may include unintentional dictation errors.   Carrie Mew, MD 05/27/22 1213    Carrie Mew, MD 05/27/22 1324

## 2022-05-27 NOTE — ED Notes (Signed)
Pt given 2 warm blankets and heat is on 75 egrees. Other RN checked temp, afebrile.

## 2022-05-27 NOTE — ED Notes (Signed)
Urine sent to lab with temporary label along with second troponin

## 2022-05-27 NOTE — ED Notes (Signed)
Pt trying to get up again. Pt yelling "I can't breathe!" And demanding to talk again to MD. Dr Joni Fears informed.

## 2022-05-27 NOTE — ED Triage Notes (Signed)
Pt states he is having a hard time breathing and like his heart is racing- pt states he has been feeling bad for "days"- pt also having pain in his chest- pt has a hx of CHF

## 2022-05-27 NOTE — ED Notes (Signed)
EDP saw pt at bedside. Pt stating "I've been breathing like this for weeks and I think it's my anxiety". See new orders. Pt refuses to get all the way into bed. Sitting at side of bed.

## 2022-05-27 NOTE — ED Notes (Signed)
Pt calling out "help!" And calling on call light. Entered room with tech, pt was getting out of bed. Pt helped back into bed. Pt then needed to use urinal. Helped pt stand, pt could not urinate. Back in bed with bed alarm on.

## 2022-05-27 NOTE — ED Provider Triage Note (Signed)
Emergency Medicine Provider Triage Evaluation Note  Nahuel Wilbert , a 87 y.o. male  was evaluated in triage.  Pt complains of anxiety and SOB for 5-6 days.Did not take his lorazepam today. On 97% on RA, though EMS put him on 2L [place] for comfort.  Reports that he "can't catch my breath and my heart is beating hard." Reports that he has chest pain. Reports history of CHF. Reports 20lb weight gain in 1 month, compliant with lasix. Reports baseline leg swelling. Lives with his caretaker   Review of Systems  Positive: SOB, anxiety, CP Negative: Fever, n/v  Physical Exam  There were no vitals taken for this visit. Gen:   Awake, no distress   Resp:  Normal effort  MSK:   Moves extremities without difficulty  Other:    Medical Decision Making  Medically screening exam initiated at 7:50 AM.  Appropriate orders placed.  Odilon Cass was informed that the remainder of the evaluation will be completed by another provider, this initial triage assessment does not replace that evaluation, and the importance of remaining in the ED until their evaluation is complete.     Marquette Old, PA-C 05/27/22 [phone]

## 2022-05-27 NOTE — ED Notes (Signed)
Pt will need EMS transport. Unable to walk. Has home health aid 24hr/day.

## 2022-05-27 NOTE — ED Notes (Signed)
Pt given 2 additional blankets by RN

## 2022-05-27 NOTE — ED Triage Notes (Signed)
Pt in via [facility] from home with c/o SOB. EMS reports pt with hx of anxiety and is anxious this am and has not taken his lorazepam. Pt intermittently hyperventilating and stating he is going to have a panic attack.  162/78, 72 HR, 97% RA, placed on 2L for comfort

## 2022-05-30 ENCOUNTER — Ambulatory Visit: Payer: Medicare Other | Admitting: Pain Medicine

## 2022-06-01 ENCOUNTER — Ambulatory Visit: Payer: Self-pay

## 2022-06-01 NOTE — Telephone Encounter (Signed)
Patient called in to schedule an appointment, and states that he is cold and is having anxiety. Offered to let patient speak with a nurse but he declined. Please call patient back to discuss.   Left message to call back.

## 2022-06-01 NOTE — Telephone Encounter (Signed)
Second attempt to reach pt. Left message to call back. 

## 2022-06-01 NOTE — Telephone Encounter (Signed)
Summary: anxiety   Patient called in to schedule an appointment, and states that he is cold and is having anxiety. Offered to let patient speak with a nurse but he declined. Please call patient back to discuss.      Called pt - Pt declined to speak with me regarding s/s.

## 2022-06-06 ENCOUNTER — Ambulatory Visit: Payer: Medicare Other | Admitting: Family Medicine

## 2022-06-06 ENCOUNTER — Encounter: Payer: Self-pay | Admitting: Family Medicine

## 2022-06-06 ENCOUNTER — Ambulatory Visit (INDEPENDENT_AMBULATORY_CARE_PROVIDER_SITE_OTHER): Payer: Medicare Other | Admitting: Family Medicine

## 2022-06-06 VITALS — BP 144/92 | HR 79 | Ht 71.0 in | Wt 240.0 lb

## 2022-06-06 DIAGNOSIS — G6289 Other specified polyneuropathies: Secondary | ICD-10-CM | POA: Diagnosis not present

## 2022-06-06 DIAGNOSIS — D649 Anemia, unspecified: Secondary | ICD-10-CM

## 2022-06-06 DIAGNOSIS — F411 Generalized anxiety disorder: Secondary | ICD-10-CM | POA: Diagnosis not present

## 2022-06-06 DIAGNOSIS — F410 Panic disorder [episodic paroxysmal anxiety] without agoraphobia: Secondary | ICD-10-CM | POA: Diagnosis not present

## 2022-06-06 MED ORDER — BUSPIRONE HCL 5 MG PO TABS: 5.0000 mg | ORAL_TABLET | Freq: Three times a day (TID) | ORAL | 3 refills | Status: DC

## 2022-06-06 MED ORDER — GABAPENTIN 300 MG PO CAPS: 300.0000 mg | ORAL_CAPSULE | Freq: Every day | ORAL | 3 refills | Status: DC

## 2022-06-06 MED ORDER — IRON (FERROUS SULFATE) 325 (65 FE) MG PO TABS: 325.0000 mg | ORAL_TABLET | Freq: Every day | ORAL | Status: DC

## 2022-06-06 NOTE — Progress Notes (Signed)
Subjective:    Patient ID: Aaron Gibbs, male    DOB: 1935-01-26, 87 y.o.   MRN: [id number]  Wilder Kurowski is a 87 y.o. male presenting on 06/06/2022 for Anxiety, Chills, and Headache   HPI  Symptomatic Anemia Reports fatigue tired feeling easily cold and dyspnea Last lab CBC 9 range, prior 9-10 range over past 4 months Not on iron  Headache Episodic, lasting hours at a time - Tylenol x2 at one dose AS NEEDED does help but doesn't resolve  History of Emphysema Previuosly on inhaler in past but high cost  Anxiety, panic attacks Recurrent chronic problem Using Lorazepam 0.5 mg TWICE A DAY      06/06/2022    3:32 PM 05/02/2022    2:16 PM 04/18/2022    8:47 AM  Depression screen PHQ 2/9  Decreased Interest 1 2 0  Down, Depressed, Hopeless 1 1 0  PHQ - 2 Score 2 3 0  Altered sleeping 3 3   Tired, decreased energy 3 1   Change in appetite 3 3   Feeling bad or failure about yourself  0 0   Trouble concentrating 0 1   Moving slowly or fidgety/restless 0 0   Suicidal thoughts 0 0   PHQ-9 Score 11 11   Difficult doing work/chores Not difficult at all Somewhat difficult     Social History   Tobacco Use   Smoking status: Former    Years: 20.00    Types: Cigarettes   Smokeless tobacco: Current    Types: Chew   Tobacco comments:    stopped 15 years ago  Vaping Use   Vaping Use: Never used  Substance Use Topics   Alcohol use: Not Currently    Comment: past   Drug use: Never    Review of Systems Per HPI unless specifically indicated above     Objective:    BP (!) 144/92   Pulse 79   Ht '5\' 11"'$  (1.803 m)   Wt 240 lb (108.9 kg)   SpO2 98%   BMI 33.47 kg/m   Wt Readings from Last 3 Encounters:  06/06/22 240 lb (108.9 kg)  05/27/22 240 lb (108.9 kg)  05/02/22 220 lb (99.8 kg)    Physical Exam Vitals and nursing note reviewed.  Constitutional:      General: He is not in acute distress.    Appearance: Normal appearance. He is well-developed. He  is obese. He is not diaphoretic.     Comments: Well-appearing, comfortable, cooperative  HENT:     Head: Normocephalic and atraumatic.  Eyes:     General:        Right eye: No discharge.        Left eye: No discharge.     Conjunctiva/sclera: Conjunctivae normal.  Cardiovascular:     Rate and Rhythm: Normal rate.  Pulmonary:     Effort: Pulmonary effort is normal.     Comments: Reduced air movement. Skin:    General: Skin is warm and dry.     Findings: No erythema or rash.  Neurological:     Mental Status: He is alert and oriented to person, place, and time.  Psychiatric:        Mood and Affect: Mood normal.        Behavior: Behavior normal.        Thought Content: Thought content normal.     Comments: Well groomed, good eye contact, normal speech and thoughts    Results for orders placed or  performed during the hospital encounter of 05/27/22  Resp panel by RT-PCR (RSV, Flu A&B, Covid) Anterior Nasal Swab   Specimen: Anterior Nasal Swab  Result Value Ref Range   SARS Coronavirus 2 by RT PCR NEGATIVE NEGATIVE   Influenza A by PCR NEGATIVE NEGATIVE   Influenza B by PCR NEGATIVE NEGATIVE   Resp Syncytial Virus by PCR NEGATIVE NEGATIVE  Brain natriuretic peptide  Result Value Ref Range   B Natriuretic Peptide 156.0 (H) 0.0 - 100.0 pg/mL  Basic metabolic panel  Result Value Ref Range   Sodium 144 135 - 145 mmol/L   Potassium 4.3 3.5 - 5.1 mmol/L   Chloride 114 (H) 98 - 111 mmol/L   CO2 25 22 - 32 mmol/L   Glucose, Bld 132 (H) 70 - 99 mg/dL   BUN 19 8 - 23 mg/dL   Creatinine, Ser 1.12 0.61 - 1.24 mg/dL   Calcium 9.0 8.9 - 10.3 mg/dL   GFR, Estimated >60 >60 mL/min   Anion gap 5 5 - 15  CBC with Differential  Result Value Ref Range   WBC 2.9 (L) 4.0 - 10.5 K/uL   RBC 4.04 (L) 4.22 - 5.81 MIL/uL   Hemoglobin 9.8 (L) 13.0 - 17.0 g/dL   HCT 33.6 (L) 39.0 - 52.0 %   MCV 83.2 80.0 - 100.0 fL   MCH 24.3 (L) 26.0 - 34.0 pg   MCHC 29.2 (L) 30.0 - 36.0 g/dL   RDW 18.0 (H)  11.5 - 15.5 %   Platelets 131 (L) 150 - 400 K/uL   nRBC 0.0 0.0 - 0.2 %   Neutrophils Relative % 40 %   Neutro Abs 1.2 (L) 1.7 - 7.7 K/uL   Lymphocytes Relative 26 %   Lymphs Abs 0.8 0.7 - 4.0 K/uL   Monocytes Relative 21 %   Monocytes Absolute 0.6 0.1 - 1.0 K/uL   Eosinophils Relative 6 %   Eosinophils Absolute 0.2 0.0 - 0.5 K/uL   Basophils Relative 1 %   Basophils Absolute 0.0 0.0 - 0.1 K/uL   WBC Morphology MILD LEFT SHIFT (1-5% METAS, OCC MYELO, OCC BANDS)    RBC Morphology MORPHOLOGY UNREMARKABLE    Smear Review Normal platelet morphology    Immature Granulocytes 6 %   Abs Immature Granulocytes 0.17 (H) 0.00 - 0.07 K/uL  Troponin I (High Sensitivity)  Result Value Ref Range   Troponin I (High Sensitivity) 13 <18 ng/L  Troponin I (High Sensitivity)  Result Value Ref Range   Troponin I (High Sensitivity) 13 <18 ng/L      Assessment & Plan:   Problem List Items Addressed This Visit     Generalized anxiety disorder with panic attacks   Relevant Medications   busPIRone (BUSPAR) 5 MG tablet   Other Visit Diagnoses     Symptomatic anemia    -  Primary   Relevant Medications   Iron, Ferrous Sulfate, 325 (65 Fe) MG TABS   Other Relevant Orders   Ambulatory referral to Hematology / Oncology   Normocytic anemia       Relevant Medications   Iron, Ferrous Sulfate, 325 (65 Fe) MG TABS   Other Relevant Orders   Ambulatory referral to Hematology / Oncology   Other polyneuropathy       Relevant Medications   busPIRone (BUSPAR) 5 MG tablet   gabapentin (NEURONTIN) 300 MG capsule       Symptomatic anemia - likely explanation of feeling cold, fatigue and dyspnea Hgb to 9 range Add  Ferrous Sulfate '325mg'$  (65 Fe) once daily with meal to avoid or reduce constipation. + Vitamin C with iron Referral to Heme/Onc for eval if consider IV iron infusion  For anxiety Improved on Lorazepam 0.'5mg'$  AS NEEDED usually takes 1 hr for acute anxiety to resolve on med. New med  Buspar '5mg'$  3  times a day, to reduce anxiety. If needed can still take the Lorazepam. As needed.  Re order dose adjust / Gabapentin now '300mg'$  take one at 3pm  History of Emphysema Dyspnea chronic Trial on Breztri sample, may warrant other in future  Orders Placed This Encounter  Procedures   Ambulatory referral to Hematology / Oncology    Referral Priority:   Routine    Referral Type:   Consultation    Referral Reason:   Specialty Services Required    Requested Specialty:   Oncology    Number of Visits Requested:   1     Meds ordered this encounter  Medications   Iron, Ferrous Sulfate, 325 (65 Fe) MG TABS    Sig: Take 325 mg by mouth daily with supper.    Dispense:  30 tablet   busPIRone (BUSPAR) 5 MG tablet    Sig: Take 1 tablet (5 mg total) by mouth 3 (three) times daily.    Dispense:  90 tablet    Refill:  3   gabapentin (NEURONTIN) 300 MG capsule    Sig: Take 1 capsule (300 mg total) by mouth daily.    Dispense:  90 capsule    Refill:  3      Follow up plan: Return if symptoms worsen or fail to improve.   [name], [facility] 06/06/2022, 3:57 PM

## 2022-06-06 NOTE — Patient Instructions (Addendum)
Thank you for coming to the office today.  Ferrous Sulfate '325mg'$  (65 Fe) once daily with meal to avoid or reduce constipation.  Keep on Multivitamin with Vitamin C at the same time and can use orange juice  I will also refer you to the Hematologist, blood specialist, who can help with an IV iron infusion if this is not working fast enough.  Referral, stay tuned for apt.  [place] [facility] (Hematology/Oncology) [address], [place] [address] Phone: [phone]  For anxiety New med  Buspar '5mg'$  3 times a day, to reduce anxiety. If needed can still take the Lorazepam. As needed.  Gabapentin now '300mg'$  take one at 3pm  Sample inhaler trial  Please schedule a Follow-up Appointment to: Return if symptoms worsen or fail to improve.  If you have any other questions or concerns, please feel free to call the office or send a message through [facility]. You may also schedule an earlier appointment if necessary.  Additionally, you may be receiving a survey about your experience at our office within a few days to 1 week by e-mail or mail. We value your feedback.  [name], DO [place]

## 2022-06-09 ENCOUNTER — Inpatient Hospital Stay: Payer: Medicare Other | Attending: Internal Medicine | Admitting: Internal Medicine

## 2022-06-09 ENCOUNTER — Inpatient Hospital Stay: Payer: Medicare Other

## 2022-06-09 VITALS — BP 163/78 | HR 67 | Temp 98.2°F | Resp 18 | Wt 241.0 lb

## 2022-06-09 DIAGNOSIS — D649 Anemia, unspecified: Secondary | ICD-10-CM | POA: Insufficient documentation

## 2022-06-09 DIAGNOSIS — D709 Neutropenia, unspecified: Secondary | ICD-10-CM

## 2022-06-09 DIAGNOSIS — D61818 Other pancytopenia: Secondary | ICD-10-CM | POA: Insufficient documentation

## 2022-06-09 LAB — IRON AND TIBC
Iron: 101 ug/dL (ref 45–182)
Saturation Ratios: 23 % (ref 17.9–39.5)
TIBC: 447 ug/dL (ref 250–450)
UIBC: 346 ug/dL

## 2022-06-09 LAB — CBC WITH DIFFERENTIAL/PLATELET
Abs Immature Granulocytes: 0 10*3/uL (ref 0.00–0.07)
Band Neutrophils: 1 %
Basophils Absolute: 0 10*3/uL (ref 0.0–0.1)
Basophils Relative: 1 %
Eosinophils Absolute: 0.2 10*3/uL (ref 0.0–0.5)
Eosinophils Relative: 7 %
HCT: 32.8 % — ABNORMAL LOW (ref 39.0–52.0)
Hemoglobin: 9.6 g/dL — ABNORMAL LOW (ref 13.0–17.0)
Lymphocytes Relative: 23 %
Lymphs Abs: 0.7 10*3/uL (ref 0.7–4.0)
MCH: 24 pg — ABNORMAL LOW (ref 26.0–34.0)
MCHC: 29.3 g/dL — ABNORMAL LOW (ref 30.0–36.0)
MCV: 82 fL (ref 80.0–100.0)
Monocytes Absolute: 0.5 10*3/uL (ref 0.1–1.0)
Monocytes Relative: 17 %
Myelocytes: 1 %
Neutro Abs: 1.6 10*3/uL — ABNORMAL LOW (ref 1.7–7.7)
Neutrophils Relative %: 50 %
Platelets: 138 10*3/uL — ABNORMAL LOW (ref 150–400)
RBC: 4 MIL/uL — ABNORMAL LOW (ref 4.22–5.81)
RDW: 18.3 % — ABNORMAL HIGH (ref 11.5–15.5)
Smear Review: NORMAL
WBC: 3.1 10*3/uL — ABNORMAL LOW (ref 4.0–10.5)
nRBC: 0 % (ref 0.0–0.2)

## 2022-06-09 LAB — FERRITIN: Ferritin: 16 ng/mL — ABNORMAL LOW (ref 24–336)

## 2022-06-09 LAB — VITAMIN B12: Vitamin B-12: 468 pg/mL (ref 180–914)

## 2022-06-09 LAB — FOLATE: Folate: 21.4 ng/mL (ref >5.9)

## 2022-06-09 NOTE — Progress Notes (Signed)
[place]  [phone] [phone]  ID: [name] OB: [birth year]  MR#: [id number]  MWU#:[id number]  Patient Care Team: Olin Hauser, DO as PCP - General (Family Medicine)  REFERRING PROVIDER: Dr. Parks Ranger  REASON FOR REFERRAL: anemia   HPI: Aaron Gibbs is a 87 y.o. male with pmh of COPD, glaucoma, anxiety was referred to hematology for anemia, fatigue and feeling cold.  Patient is legally blind.  Caretaker was accompanying him. He was admitted in August 2023 for dizziness thought to be benign positional vertigo. Found to have Hb 9.9. 1 year ago it was normal. Denies any bleeding, weight loss, changes in appetite. He feels fatigued and cold. He has sob. He was started on oral iron by primary.   REVIEW OF SYSTEMS:   ROS  As per HPI. Otherwise, a complete review of systems is negative.  PAST MEDICAL HISTORY: Past Medical History:  Diagnosis Date   Anxiety    Cancer ([place])    skin   COPD (chronic obstructive pulmonary disease) (HCC)    Glaucoma    Osteoporosis    osteopenia    PAST SURGICAL HISTORY: Past Surgical History:  Procedure Laterality Date   CHOLECYSTECTOMY     TRANSURETHRAL RESECTION OF PROSTATE N/A 2008    FAMILY HISTORY: Family History  Problem Relation Age of Onset   Depression Mother    Heart disease Father     HEALTH MAINTENANCE: Social History   Tobacco Use   Smoking status: Former    Years: 20.00    Types: Cigarettes   Smokeless tobacco: Current    Types: Chew   Tobacco comments:    stopped 15 years ago  Vaping Use   Vaping Use: Never used  Substance Use Topics   Alcohol use: Not Currently    Comment: past   Drug use: Never     Allergies  Allergen Reactions   Azithromycin Shortness Of Breath    Current Outpatient Medications  Medication Sig Dispense Refill   acetaminophen (TYLENOL) 500 MG tablet Take 1,000 mg by mouth in the morning, at noon, and at bedtime.      amitriptyline (ELAVIL) 10 MG tablet Take 1 tablet (10 mg total) by mouth at bedtime. 90 tablet 1   bumetanide (BUMEX) 1 MG tablet Take 0.5 tablets (0.5 mg total) by mouth 2 (two) times daily. 90 tablet 1   buprenorphine (BUTRANS) 10 MCG/HR PTWK Place 1 patch onto the skin once a week for 28 days. Apply only 1 patch at a time and alternate sites weekly. 4 patch 0   [START ON 06/21/2022] buprenorphine (BUTRANS) 10 MCG/HR PTWK Place 1 patch onto the skin once a week for 28 days. Apply only 1 patch at a time and alternate sites weekly. 4 patch 0   [START ON 07/19/2022] buprenorphine (BUTRANS) 10 MCG/HR PTWK Place 1 patch onto the skin once a week for 28 days. Apply only 1 patch at a time and alternate sites weekly. 4 patch 0   [START ON 08/16/2022] buprenorphine (BUTRANS) 10 MCG/HR PTWK Place 1 patch onto the skin once a week for 28 days. Apply only 1 patch at a time and alternate sites weekly. 4 patch 0   carvedilol (COREG) 12.5 MG tablet Take 1 tablet (12.5 mg total) by mouth 2 (two) times daily. 180 tablet 1   clotrimazole-betamethasone (LOTRISONE) cream Apply topically 2 (two) times daily. For 7-10 days 30 g 2   COSOPT 22.3-6.8 MG/ML ophthalmic solution Place 1 drop into both eyes  2 (two) times daily.     DULoxetine (CYMBALTA) 30 MG capsule Take 3 capsules (90 mg total) by mouth daily. 270 capsule 1   finasteride (PROSCAR) 5 MG tablet Take 1 tablet (5 mg total) by mouth daily. 90 tablet 3   gabapentin (NEURONTIN) 300 MG capsule Take 1 capsule (300 mg total) by mouth daily. 90 capsule 3   Homeopathic Products (ARNICARE) GEL Apply topically.     Iron, Ferrous Sulfate, 325 (65 Fe) MG TABS Take 325 mg by mouth daily with supper. 30 tablet    latanoprost (XALATAN) 0.005 % ophthalmic solution Place 1 drop into both eyes at bedtime.     LORazepam (ATIVAN) 0.5 MG tablet Take 1 tablet (0.5 mg total) by mouth 2 (two) times daily as needed for anxiety. 60 tablet 2   losartan (COZAAR) 50 MG tablet Take 1 tablet (50  mg total) by mouth daily. 90 tablet 1   Melatonin 5 MG CAPS Take 1 capsule (5 mg total) by mouth at bedtime.  0   nystatin (MYCOSTATIN/NYSTOP) powder Apply 1 Application topically 3 (three) times daily. For 2-4 weeks. 15 g 2   OLANZapine (ZYPREXA) 2.5 MG tablet Take 1 tablet (2.5 mg total) by mouth 2 (two) times daily. 180 tablet 1   pantoprazole (PROTONIX) 40 MG tablet Take 1 tablet (40 mg total) by mouth daily. 90 tablet 3   Propylene Glycol (SYSTANE BALANCE OP) Place 1 drop into the left eye daily.     tamsulosin (FLOMAX) 0.4 MG CAPS capsule Take 1 capsule (0.4 mg total) by mouth daily. 90 capsule 1   traZODone (DESYREL) 150 MG tablet Take 1 tablet (150 mg total) by mouth at bedtime as needed. 90 tablet 1   busPIRone (BUSPAR) 5 MG tablet Take 1 tablet (5 mg total) by mouth 3 (three) times daily. (Patient not taking: Reported on 06/09/2022) 90 tablet 3   cetirizine (ZYRTEC) 10 MG tablet Take 10 mg by mouth daily. (Patient not taking: Reported on 06/09/2022)     diclofenac Sodium (VOLTAREN) 1 % GEL Apply 2 g topically at bedtime. Apply to both knees twice daily 350 g 0   traMADol (ULTRAM) 50 MG tablet Take 1 tablet (50 mg total) by mouth every 8 (eight) hours as needed for up to 7 days for severe pain. Must last 7 days. 21 tablet 0   No current facility-administered medications for this visit.    OBJECTIVE: Vitals:   06/09/22 1125  BP: (!) 163/78  Pulse: 67  Resp: 18  Temp: 98.2 F (36.8 C)  SpO2: 98%     Body mass index is 33.61 kg/m.      General: Well-developed, well-nourished, no acute distress. Eyes: Pink conjunctiva, anicteric sclera. HEENT: Normocephalic, moist mucous membranes, clear oropharnyx. Lungs: Clear to auscultation bilaterally. Heart: Regular rate and rhythm. No rubs, murmurs, or gallops. Abdomen: Soft, nontender, nondistended. No organomegaly noted, normoactive bowel sounds. Musculoskeletal: No edema, cyanosis, or clubbing. Neuro: Alert, answering all questions  appropriately. Cranial nerves grossly intact. Skin: No rashes or petechiae noted. Psych: Normal affect. Lymphatics: No cervical, calvicular, axillary or inguinal LAD.   LAB RESULTS:  Lab Results  Component Value Date   NA 144 05/27/2022   K 4.3 05/27/2022   CL 114 (H) 05/27/2022   CO2 25 05/27/2022   GLUCOSE 132 (H) 05/27/2022   BUN 19 05/27/2022   CREATININE 1.12 05/27/2022   CALCIUM 9.0 05/27/2022   PROT 7.0 04/30/2022   ALBUMIN 3.7 04/30/2022   AST 20 04/30/2022  ALT 11 04/30/2022   ALKPHOS 71 04/30/2022   BILITOT 0.8 04/30/2022   GFRNONAA >60 05/27/2022   GFRAA >60 02/09/2020    Lab Results  Component Value Date   WBC 3.1 (L) 06/09/2022   NEUTROABS 1.6 (L) 06/09/2022   HGB 9.6 (L) 06/09/2022   HCT 32.8 (L) 06/09/2022   MCV 82.0 06/09/2022   PLT 138 (L) 06/09/2022    Lab Results  Component Value Date   TIBC 447 06/09/2022   FERRITIN 16 (L) 06/09/2022   IRONPCTSAT 23 06/09/2022     STUDIES: CT Angio Chest PE W and/or Wo Contrast  Result Date: 05/27/2022 CLINICAL DATA:  PE suspected. EXAM: CT ANGIOGRAPHY CHEST WITH CONTRAST TECHNIQUE: Multidetector CT imaging of the chest was performed using the standard protocol during bolus administration of intravenous contrast. Multiplanar CT image reconstructions and MIPs were obtained to evaluate the vascular anatomy. RADIATION DOSE REDUCTION: This exam was performed according to the departmental dose-optimization program which includes automated exposure control, adjustment of the mA and/or kV according to patient size and/or use of iterative reconstruction technique. CONTRAST:  154m OMNIPAQUE IOHEXOL 350 MG/ML SOLN COMPARISON:  CT CAP 05/10/2018, CT PE 04/30/22 FINDINGS: Cardiovascular: Satisfactory opacification of the pulmonary arteries to the segmental level. No evidence of pulmonary embolism. Normal heart size. No pericardial effusion. There are mild coronary artery calcifications. Mediastinum/Nodes: No enlarged  mediastinal, hilar, or axillary lymph nodes. Thyroid gland, trachea, and esophagus demonstrate no significant findings. Lungs/Pleura: No pleural effusion or pneumothorax. Stable to slight interval decrease in previously seen bibasilar bronchiectasis and chronic scarring in the lingula. No new large consolidative opacity is visualized. There is bilateral dependent atelectasis. Unchanged bilateral lower lobe bronchial wall thickening. Upper Abdomen: Redemonstrated infrarenal abdominal aortic aneurysm measuring up to 4.6 x 4.4 cm, previously 3.7 x 4.0 cm on 05/10/2018. Status post cholecystectomy. There is diverticulosis without diverticulitis. Musculoskeletal: No chest wall abnormality. No acute or significant osseous findings. Unchanged focal kyphosis centered at the T9-T10 vertebral body levels. There is near fusion of the cervical and thoracic vertebral bodies. Review of the MIP images confirms the above findings. IMPRESSION: 1. No evidence of pulmonary embolism. 2. No new finding to explain shortness of breath. 3. Redemonstrated infrarenal abdominal aortic aneurysm measuring up to 4.6 cm, previously 3.7 cm on 05/10/2018. Recommend follow-up every 12 months and vascular consultation. Electronically Signed   By: HMarin RobertsM.D.   On: 05/27/2022 11:03   DG Chest 2 View  Result Date: 05/27/2022 CLINICAL DATA:  Chest pain and shortness of breath. EXAM: CHEST - 2 VIEW COMPARISON:  04/30/2022 FINDINGS: Heart size is normal. No pleural effusion or edema. Scarring noted within the lingula. No airspace opacities. Exaggerated thoracic kyphosis with ankylosis throughout the visualized thoracolumbar spine. IMPRESSION: 1. No acute cardiopulmonary abnormalities. 2. Lingular scarring. Electronically Signed   By: TKerby MoorsM.D.   On: 05/27/2022 08:36    ASSESSMENT AND PLAN:   WTimothy Trudellis a 87y.o. male with pmh of COPD, glaucoma, anxiety was referred to hematology for anemia, fatigue and feeling  cold.  #Pancytopenia - progressive. Recent Hb ~9. In 2021 Hb normal. WBC and platelets mildly low  - check labs as below.  - He is on oral iron. I will inform the patient if he needs IV iron.   Orders Placed This Encounter  Procedures   CBC with Differential   Iron and TIBC   Ferritin   Vitamin B12   Folate   Multiple Myeloma Panel (SPEP&IFE w/QIG)  Kappa/lambda light chains   Flow cytometry panel-leukemia/lymphoma work-up   RTC TBD after labs   Patient expressed understanding and was in agreement with this plan. He also understands that He can call clinic at any time with any questions, concerns, or complaints.   I spent a total of 45 minutes reviewing chart data, face-to-face evaluation with the patient, counseling and coordination of care as detailed above.  Jane Canary, MD   06/09/2022 3:33 PM

## 2022-06-09 NOTE — Progress Notes (Signed)
Patient here today for initial evaluation regarding anemia. Patient reports fatigue and shortness of breath.

## 2022-06-13 ENCOUNTER — Telehealth: Payer: Self-pay | Admitting: Internal Medicine

## 2022-06-13 DIAGNOSIS — D61818 Other pancytopenia: Secondary | ICD-10-CM

## 2022-06-13 DIAGNOSIS — D509 Iron deficiency anemia, unspecified: Secondary | ICD-10-CM

## 2022-06-13 LAB — KAPPA/LAMBDA LIGHT CHAINS
Kappa free light chain: 21.4 mg/L — ABNORMAL HIGH (ref 3.3–19.4)
Kappa, lambda light chain ratio: 1.33 (ref 0.26–1.65)
Lambda free light chains: 16.1 mg/L (ref 5.7–26.3)

## 2022-06-13 NOTE — Telephone Encounter (Signed)
Results reviewed with the caretaker.   Repeat CBC show persistent pancytopenia. Ferritin low at 16. Considering the patient is having symptoms of feeling cold, tired will consider IV ferraheme.   Myeloma panel, flow pending.   Clonal dx such as MDS in differential. Will do MDS panel on peripheral blood next time.   Schedule for IV ferraheme x 2.  RTC for md visit, labs on 2/27

## 2022-06-14 ENCOUNTER — Other Ambulatory Visit: Payer: Self-pay | Admitting: Orthopedic Surgery

## 2022-06-14 ENCOUNTER — Encounter: Payer: Self-pay | Admitting: Orthopedic Surgery

## 2022-06-14 ENCOUNTER — Ambulatory Visit: Payer: Medicare Other | Admitting: Pain Medicine

## 2022-06-14 DIAGNOSIS — Z01818 Encounter for other preprocedural examination: Secondary | ICD-10-CM

## 2022-06-14 LAB — COMP PANEL: LEUKEMIA/LYMPHOMA

## 2022-06-14 NOTE — H&P (Signed)
Aaron Gibbs MRN:  [id number] DOB/SEX:  [birth year]/male  CHIEF COMPLAINT:  Painful left Knee  HISTORY: Patient is a 87 y.o. male presented with a history of pain in the left knee. Onset of symptoms was gradual starting several years ago with gradually worsening course since that time. Prior procedures on the knee include none. Patient has been treated conservatively with over-the-counter NSAIDs and activity modification. Patient currently rates pain in the knee at 10 out of 10 with activity. There is pain at night.  PAST MEDICAL HISTORY: Patient Active Problem List   Diagnosis Date Noted   Other pancytopenia ([place]) 06/09/2022   Unable to maintain body in lying position 05/24/2022   Benign paroxysmal positional vertigo    Weakness 01/29/2022   Abdominal pain 09/08/2021   Sensorineural hearing loss (SNHL) of both ears 07/25/2021   Pain in right knee 03/15/2021   Other chronic pain 03/15/2021   Chronic knee pain (Left) 09/28/2020   Abnormal MRI, knee (08/05/2018) (Left) 09/13/2020   Long-term current use of benzodiazepine 08/09/2020   Opioid dependence, binge pattern ([place]) 08/09/2020   Opioid dependence with or without physiological dependence ([place]) 08/08/2020   History of substance use disorder 04/15/2020   Osteoarthritis of knee (Left) 03/23/2020   Aortic atherosclerosis ([place]) 02/05/2020   Generalized anxiety disorder with panic attacks 02/04/2020   Depression with anxiety 12/13/2019   Depression 12/12/2019   COPD (chronic obstructive pulmonary disease) ([place])    HLD (hyperlipidemia)    Iron deficiency anemia    Fall at home, initial encounter    Medial meniscal tear, sequela (Left) 06/10/2019   Patellar tendinosis (Right) 06/10/2019   Lateral meniscal tear, sequela (Left) 03/03/2019   Palpitation 02/13/2019   Preop testing 11/14/2018   Noncompliance with medication treatment due to overuse of medication 10/23/2018   Neurogenic pain 08/20/2018   Atherosclerotic peripheral  vascular disease ([place]) 07/24/2018   Tricompartment osteoarthritis of knee (Left) 07/24/2018   Osteoarthritis of knee (Bilateral) 07/24/2018   Osteoarthritis of patellofemoral joint (Right) 07/24/2018   History of suicide attempt (06/12/18) 07/24/2018   Long term current use of opiate analgesic 07/10/2018   Pharmacologic therapy 07/10/2018   Disorder of skeletal system 07/10/2018   Problems influencing health status 07/10/2018   Suicide attempt ([place]) (05/13/18) 95/28/4132   Acute metabolic encephalopathy 44/06/270   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   Major depressive disorder, recurrent episode, severe ([place]) 06/04/2018   Blindness 05/10/2018   Suicidal ideation 05/10/2018   Chronic pain syndrome 05/01/2018   DISH (diffuse idiopathic skeletal hyperostosis) 05/01/2018   Osteoarthritis of multiple joints 05/01/2018   Chronic knee pain (1ry area of Pain) (Bilateral) (L>R) 05/01/2018   Retinitis pigmentosa of both eyes 05/01/2018   Glaucoma of both eyes 05/01/2018   Essential hypertension 05/01/2018   Chronic low back pain (Bilateral) w/o sciatica 05/01/2018   GERD (gastroesophageal reflux disease) 05/01/2018   AVM (arteriovenous malformation) of colon 05/01/2018   Therapeutic opioid-induced constipation (OIC) 05/01/2018   Past Medical History:  Diagnosis Date   Anxiety    Cancer ([place])    skin   COPD (chronic obstructive pulmonary disease) ([place])    Glaucoma    Osteoporosis    osteopenia   Past Surgical History:  Procedure Laterality Date   CHOLECYSTECTOMY     TRANSURETHRAL RESECTION OF PROSTATE N/A 2008     MEDICATIONS:  (Not in a hospital admission)   ALLERGIES:   Allergies  Allergen Reactions   Azithromycin Shortness Of Breath    REVIEW OF SYSTEMS:  Pertinent  items are noted in HPI.   FAMILY HISTORY:   Family History  Problem Relation Age of Onset   Depression Mother    Heart disease Father     SOCIAL HISTORY:   Social History   Tobacco Use   Smoking  status: Former    Years: 20.00    Types: Cigarettes   Smokeless tobacco: Current    Types: Chew   Tobacco comments:    stopped 15 years ago  Substance Use Topics   Alcohol use: Not Currently    Comment: past     EXAMINATION:  Vital signs in last 24 hours: '@VSRANGES'$ @  General appearance: alert, cooperative, and no distress Head: Normocephalic, without obvious abnormality, atraumatic Neck: no JVD and supple, symmetrical, trachea midline Lungs: clear to auscultation bilaterally Heart: regular rate and rhythm, S1, S2 normal, no murmur, click, rub or gallop Abdomen: soft, non-tender; bowel sounds normal; no masses,  no organomegaly Extremities: extremities normal, atraumatic, no cyanosis or edema and Homans sign is negative, no sign of DVT Pulses: 2+ and symmetric Skin: Skin color, texture, turgor normal. No rashes or lesions Neurologic: Alert and oriented X 3, normal strength and tone. Normal symmetric reflexes. Normal coordination and gait  Musculoskeletal:  ROM 0-100, Ligaments intact,  Imaging Review Plain radiographs demonstrate severe degenerative joint disease of the left knee. The overall alignment is significant varus. The bone quality appears to be excellent for age and reported activity level.  Assessment/Plan: Primary osteoarthritis, left knee   The patient history, physical examination and imaging studies are consistent with advanced degenerative joint disease of the left knee. The patient has failed conservative treatment.  The clearance notes were reviewed.  After discussion with the patient it was felt that Total Knee Replacement was indicated. The procedure,  risks, and benefits of total knee arthroplasty were presented and reviewed. The risks including but not limited to aseptic loosening, infection, blood clots, vascular injury, stiffness, patella tracking problems complications among others were discussed. The patient acknowledged the explanation, agreed to proceed  with the plan.  Carlynn Spry 06/14/2022, 5:11 PM

## 2022-06-15 ENCOUNTER — Inpatient Hospital Stay
Admission: RE | Admit: 2022-06-15 | Discharge: 2022-06-15 | Disposition: A | Discharge status: Home / Self Care | Payer: Medicare Other | Source: Ambulatory Visit

## 2022-06-15 LAB — MULTIPLE MYELOMA PANEL, SERUM
Albumin SerPl Elph-Mcnc: 3.7 g/dL (ref 2.9–4.4)
Albumin/Glob SerPl: 1.4 (ref 0.7–1.7)
Alpha 1: 0.2 g/dL (ref 0.0–0.4)
Alpha2 Glob SerPl Elph-Mcnc: 0.8 g/dL (ref 0.4–1.0)
B-Globulin SerPl Elph-Mcnc: 1.1 g/dL (ref 0.7–1.3)
Gamma Glob SerPl Elph-Mcnc: 0.6 g/dL (ref 0.4–1.8)
Globulin, Total: 2.7 g/dL (ref 2.2–3.9)
IgA: 230 mg/dL (ref 61–437)
IgG (Immunoglobin G), Serum: 744 mg/dL (ref 603–1613)
IgM (Immunoglobulin M), Srm: 39 mg/dL (ref 15–143)
Total Protein ELP: 6.4 g/dL (ref 6.0–8.5)

## 2022-06-15 NOTE — Pre-Procedure Instructions (Signed)
Patient missed his PAT appt. Called patient, spoke to patient and his daughter. Patient states he wants to put off surgery until he feels better. Patient encouraged to contact surgeon's office and provide them with this information. Daughter also aware.

## 2022-06-16 ENCOUNTER — Other Ambulatory Visit: Payer: Self-pay

## 2022-06-16 ENCOUNTER — Emergency Department
Admission: EM | Admit: 2022-06-16 | Discharge: 2022-06-16 | Disposition: A | Discharge status: Home / Self Care | Payer: Medicare Other | Attending: Emergency Medicine | Admitting: Emergency Medicine

## 2022-06-16 ENCOUNTER — Emergency Department: Payer: Medicare Other

## 2022-06-16 DIAGNOSIS — I714 Abdominal aortic aneurysm, without rupture, unspecified: Secondary | ICD-10-CM | POA: Diagnosis not present

## 2022-06-16 DIAGNOSIS — R0602 Shortness of breath: Secondary | ICD-10-CM

## 2022-06-16 DIAGNOSIS — Z1152 Encounter for screening for COVID-19: Secondary | ICD-10-CM | POA: Diagnosis not present

## 2022-06-16 DIAGNOSIS — J449 Chronic obstructive pulmonary disease, unspecified: Secondary | ICD-10-CM | POA: Insufficient documentation

## 2022-06-16 LAB — COMPREHENSIVE METABOLIC PANEL
ALT: 13 U/L (ref 0–44)
AST: 21 U/L (ref 15–41)
Albumin: 3.8 g/dL (ref 3.5–5.0)
Alkaline Phosphatase: 59 U/L (ref 38–126)
Anion gap: 6 (ref 5–15)
BUN: 27 mg/dL — ABNORMAL HIGH (ref 8–23)
CO2: 22 mmol/L (ref 22–32)
Calcium: 9.1 mg/dL (ref 8.9–10.3)
Chloride: 114 mmol/L — ABNORMAL HIGH (ref 98–111)
Creatinine, Ser: 1.12 mg/dL (ref 0.61–1.24)
GFR, Estimated: >60 mL/min (ref >60)
Glucose, Bld: 137 mg/dL — ABNORMAL HIGH (ref 70–99)
Potassium: 4.3 mmol/L (ref 3.5–5.1)
Sodium: 142 mmol/L (ref 135–145)
Total Bilirubin: 0.7 mg/dL (ref 0.3–1.2)
Total Protein: 6.9 g/dL (ref 6.5–8.1)

## 2022-06-16 LAB — RESP PANEL BY RT-PCR (RSV, FLU A&B, COVID)  RVPGX2
Influenza A by PCR: NEGATIVE
Influenza B by PCR: NEGATIVE
Resp Syncytial Virus by PCR: NEGATIVE
SARS Coronavirus 2 by RT PCR: NEGATIVE

## 2022-06-16 LAB — CBC
HCT: 39.2 % (ref 39.0–52.0)
Hemoglobin: 11.2 g/dL — ABNORMAL LOW (ref 13.0–17.0)
MCH: 24.6 pg — ABNORMAL LOW (ref 26.0–34.0)
MCHC: 28.6 g/dL — ABNORMAL LOW (ref 30.0–36.0)
MCV: 86 fL (ref 80.0–100.0)
Platelets: 124 10*3/uL — ABNORMAL LOW (ref 150–400)
RBC: 4.56 MIL/uL (ref 4.22–5.81)
RDW: 21.3 % — ABNORMAL HIGH (ref 11.5–15.5)
WBC: 3.7 10*3/uL — ABNORMAL LOW (ref 4.0–10.5)
nRBC: 0 % (ref 0.0–0.2)

## 2022-06-16 MED ORDER — SODIUM CHLORIDE 0.9 % IV BOLUS
500.0000 mL | Freq: Once | INTRAVENOUS | Status: AC
  Administered 2022-06-16: 500 mL via INTRAVENOUS

## 2022-06-16 MED ORDER — ONDANSETRON 4 MG PO TBDP
4.0000 mg | ORAL_TABLET | Freq: Once | ORAL | Status: AC
  Administered 2022-06-16: 4 mg via ORAL
  Filled 2022-06-16: qty 1

## 2022-06-16 MED ORDER — ALBUTEROL SULFATE HFA 108 (90 BASE) MCG/ACT IN AERS
2.0000 | INHALATION_SPRAY | RESPIRATORY_TRACT | Status: DC | PRN
  Filled 2022-06-16: qty 6.7

## 2022-06-16 MED ORDER — LORAZEPAM 1 MG PO TABS
0.5000 mg | ORAL_TABLET | Freq: Once | ORAL | Status: AC
  Administered 2022-06-16: 0.5 mg via ORAL
  Filled 2022-06-16: qty 1

## 2022-06-16 MED ORDER — IOHEXOL 350 MG/ML SOLN
100.0000 mL | Freq: Once | INTRAVENOUS | Status: AC | PRN
  Administered 2022-06-16: 100 mL via INTRAVENOUS

## 2022-06-16 NOTE — ED Triage Notes (Signed)
Pt comes via [facility] from home with c/o sob for 1 day and chills for 30 days. VSS

## 2022-06-16 NOTE — ED Notes (Signed)
Pt continues to lay on the floor. Pt was assisted up and placed  back in wheelchair and protocols completed. Pt brought back out to lobby and got out of chair and laid on floor again. Pt states he is going to stay on the floor. Pt keeps yelling out for help.

## 2022-06-16 NOTE — ED Notes (Signed)
Pt denies CP

## 2022-06-16 NOTE — Discharge Instructions (Signed)
Your lab tests and CT scans were all okay today.  Continue taking all of your usual home medications and follow-up with your doctor next week.

## 2022-06-16 NOTE — ED Notes (Signed)
Pt reports SOB, sweats, fatigue and "feeling terrible x couple weeks; CHF history per pt.

## 2022-06-16 NOTE — ED Notes (Signed)
Pt continues to lay on the floor and holler out. Pt states he wants to stay on floor. Security and other staff have attempted to get pt up but he continues to refuse.

## 2022-06-16 NOTE — ED Provider Notes (Signed)
[facility] Provider Note    Event Date/Time   First MD Initiated Contact with Patient 06/16/22 1238     (approximate)   History   Chief Complaint: Shortness of Breath   HPI  Aaron Gibbs is a 87 y.o. male with a history of chronic pain syndrome, COPD, anxiety who comes ED complaining of shortness of breath that started this morning, associated with fatigue.  No chest pain or abdominal pain.  No vomiting diaphoresis diarrhea or fever.  Patient does report decreased oral intake for the past few days.  He is compliant with Lasix during this time.     Physical Exam   Triage Vital Signs: ED Triage Vitals  Enc Vitals Group     BP 06/16/22 1600 (!) 158/74     Pulse Rate 06/16/22 1600 68     Resp 06/16/22 1600 16     Temp 06/16/22 1600 98.1 F (36.7 C)     Temp src --      SpO2 06/16/22 1600 98 %     Weight 06/16/22 1312 240 lb 15.4 oz (109.3 kg)     Height 06/16/22 1312 '5\' 11"'$  (1.803 m)     Head Circumference --      Peak Flow --      Pain Score 06/16/22 0920 0     Pain Loc --      Pain Edu? --      Excl. in [place]? --     Most recent vital signs: Vitals:   06/16/22 1600  BP: (!) 158/74  Pulse: 68  Resp: 16  Temp: 98.1 F (36.7 C)  SpO2: 98%    General: Awake, no distress.  Anxious appearing CV:  Good peripheral perfusion.  Regular rate and rhythm.  Normal peripheral pulses. Resp:  Normal effort.  Clear to auscultation bilaterally.  No wheezing or crackles Abd:  No distention.  Soft nontender Other:  No edema.  Dry oral mucosa   ED Results / Procedures / Treatments   Labs (all labs ordered are listed, but only abnormal results are displayed) Labs Reviewed  CBC - Abnormal; Notable for the following components:      Result Value   WBC 3.7 (*)    Hemoglobin 11.2 (*)    MCH 24.6 (*)    MCHC 28.6 (*)    RDW 21.3 (*)    Platelets 124 (*)    All other components within normal limits  COMPREHENSIVE METABOLIC PANEL - Abnormal;  Notable for the following components:   Chloride 114 (*)    Glucose, Bld 137 (*)    BUN 27 (*)    All other components within normal limits  RESP PANEL BY RT-PCR (RSV, FLU A&B, COVID)  RVPGX2  URINALYSIS, ROUTINE W REFLEX MICROSCOPIC     EKG Interpreted by me Atrial fibrillation, rate of 76.  Normal axis intervals QRS ST segments and T waves.   RADIOLOGY Chest x-ray interpreted by me, appears normal.  Radiology report reviewed.  CT angiogram chest negative for PE or other acute findings.  CT abdomen pelvis unremarkable.   PROCEDURES:  Procedures   MEDICATIONS ORDERED IN ED: Medications  albuterol (VENTOLIN HFA) 108 (90 Base) MCG/ACT inhaler 2 puff (has no administration in time range)  sodium chloride 0.9 % bolus 500 mL (0 mLs Intravenous Stopped 06/16/22 1616)  ondansetron (ZOFRAN-ODT) disintegrating tablet 4 mg (4 mg Oral Given 06/16/22 1410)  LORazepam (ATIVAN) tablet 0.5 mg (0.5 mg Oral Given 06/16/22 1410)  iohexol (  OMNIPAQUE) 350 MG/ML injection 100 mL (100 mLs Intravenous Contrast Given 06/16/22 1426)     IMPRESSION / MDM / ASSESSMENT AND PLAN / ED COURSE  I reviewed the triage vital signs and the nursing notes.                              Differential diagnosis includes, but is not limited to, pneumonia, pleural effusion, pulmonary edema, pulmonary embolism, diverticulitis, viral syndrome, dehydration  Patient's presentation is most consistent with acute presentation with potential threat to life or bodily function.  Patient presents with shortness of breath and fatigue along with decreased oral intake.  He does appear to be mildly dehydrated on exam.  Vital signs are unremarkable.  Patient given IV fluids.  Labs and CTs are all unremarkable. IMPRESSION: 1. No evidence of pulmonary embolism to the central segmental level. 2. No acute findings in the chest, abdomen or pelvis. 3. Infrarenal abdominal aortic aneurysm measuring approximately 4.9 x 4.2 greatest  dimension. Previous maximal diameter approximately 4.2 x 3.8 cm in 2019. No adjacent stranding or signs of dissection. Recommend follow-up every 6 months and vascular consultation. 4. Signs of ankylosing spondylitis with multilevel spinal fusion. 5. Aortic atherosclerosis.   Patient does not require admission and can be discharged home.       FINAL CLINICAL IMPRESSION(S) / ED DIAGNOSES   Final diagnoses:  SOB (shortness of breath)  Chronic obstructive pulmonary disease, unspecified COPD type ([place])     Rx / DC Orders   ED Discharge Orders     None        Note:  This document was prepared using Dragon voice recognition software and may include unintentional dictation errors.   Carrie Mew, MD 06/16/22 [phone]

## 2022-06-19 ENCOUNTER — Other Ambulatory Visit: Payer: Self-pay

## 2022-06-19 ENCOUNTER — Emergency Department
Admission: EM | Admit: 2022-06-19 | Discharge: 2022-06-20 | Disposition: A | Discharge status: Home / Self Care | Payer: Medicare Other | Attending: Emergency Medicine | Admitting: Emergency Medicine

## 2022-06-19 DIAGNOSIS — F419 Anxiety disorder, unspecified: Secondary | ICD-10-CM | POA: Insufficient documentation

## 2022-06-19 DIAGNOSIS — Z79899 Other long term (current) drug therapy: Secondary | ICD-10-CM | POA: Diagnosis not present

## 2022-06-19 DIAGNOSIS — R45851 Suicidal ideations: Secondary | ICD-10-CM | POA: Diagnosis not present

## 2022-06-19 DIAGNOSIS — F411 Generalized anxiety disorder: Secondary | ICD-10-CM | POA: Diagnosis not present

## 2022-06-19 DIAGNOSIS — F410 Panic disorder [episodic paroxysmal anxiety] without agoraphobia: Secondary | ICD-10-CM

## 2022-06-19 DIAGNOSIS — Z87891 Personal history of nicotine dependence: Secondary | ICD-10-CM | POA: Insufficient documentation

## 2022-06-19 DIAGNOSIS — J449 Chronic obstructive pulmonary disease, unspecified: Secondary | ICD-10-CM | POA: Diagnosis not present

## 2022-06-19 DIAGNOSIS — Z85828 Personal history of other malignant neoplasm of skin: Secondary | ICD-10-CM | POA: Diagnosis not present

## 2022-06-19 DIAGNOSIS — F418 Other specified anxiety disorders: Secondary | ICD-10-CM | POA: Diagnosis not present

## 2022-06-19 LAB — COMPREHENSIVE METABOLIC PANEL
ALT: 24 U/L (ref 0–44)
AST: 33 U/L (ref 15–41)
Albumin: 4 g/dL (ref 3.5–5.0)
Alkaline Phosphatase: 63 U/L (ref 38–126)
Anion gap: 7 (ref 5–15)
BUN: 19 mg/dL (ref 8–23)
CO2: 24 mmol/L (ref 22–32)
Calcium: 9.1 mg/dL (ref 8.9–10.3)
Chloride: 110 mmol/L (ref 98–111)
Creatinine, Ser: 1.17 mg/dL (ref 0.61–1.24)
GFR, Estimated: >60 mL/min (ref >60)
Glucose, Bld: 137 mg/dL — ABNORMAL HIGH (ref 70–99)
Potassium: 3.9 mmol/L (ref 3.5–5.1)
Sodium: 141 mmol/L (ref 135–145)
Total Bilirubin: 0.8 mg/dL (ref 0.3–1.2)
Total Protein: 7.4 g/dL (ref 6.5–8.1)

## 2022-06-19 LAB — URINE DRUG SCREEN, QUALITATIVE (ARMC ONLY)
Amphetamines, Ur Screen: NOT DETECTED
Barbiturates, Ur Screen: NOT DETECTED
Benzodiazepine, Ur Scrn: NOT DETECTED
Cannabinoid 50 Ng, Ur ~~LOC~~: NOT DETECTED
Cocaine Metabolite,Ur ~~LOC~~: NOT DETECTED
MDMA (Ecstasy)Ur Screen: NOT DETECTED
Methadone Scn, Ur: NOT DETECTED
Opiate, Ur Screen: NOT DETECTED
Phencyclidine (PCP) Ur S: NOT DETECTED
Tricyclic, Ur Screen: POSITIVE — AB

## 2022-06-19 LAB — CBC
HCT: 38.2 % — ABNORMAL LOW (ref 39.0–52.0)
Hemoglobin: 11.2 g/dL — ABNORMAL LOW (ref 13.0–17.0)
MCH: 25.2 pg — ABNORMAL LOW (ref 26.0–34.0)
MCHC: 29.3 g/dL — ABNORMAL LOW (ref 30.0–36.0)
MCV: 85.8 fL (ref 80.0–100.0)
Platelets: 134 10*3/uL — ABNORMAL LOW (ref 150–400)
RBC: 4.45 MIL/uL (ref 4.22–5.81)
RDW: 21.4 % — ABNORMAL HIGH (ref 11.5–15.5)
WBC: 3.3 10*3/uL — ABNORMAL LOW (ref 4.0–10.5)
nRBC: 0 % (ref 0.0–0.2)

## 2022-06-19 LAB — SALICYLATE LEVEL: Salicylate Lvl: <7 mg/dL — ABNORMAL LOW (ref 7.0–30.0)

## 2022-06-19 LAB — ACETAMINOPHEN LEVEL: Acetaminophen (Tylenol), Serum: <10 ug/mL — ABNORMAL LOW (ref 10–30)

## 2022-06-19 LAB — ETHANOL: Alcohol, Ethyl (B): <10 mg/dL (ref <10)

## 2022-06-19 MED ORDER — LORAZEPAM 0.5 MG PO TABS
0.5000 mg | ORAL_TABLET | Freq: Two times a day (BID) | ORAL | Status: DC | PRN
  Administered 2022-06-19 – 2022-06-20 (×2): 0.5 mg via ORAL
  Filled 2022-06-19 (×2): qty 1

## 2022-06-19 MED ORDER — ALUM & MAG HYDROXIDE-SIMETH 200-200-20 MG/5ML PO SUSP: 30.0000 mL | Freq: Four times a day (QID) | ORAL | Status: DC | PRN

## 2022-06-19 MED ORDER — ONDANSETRON HCL 4 MG PO TABS: 4.0000 mg | ORAL_TABLET | Freq: Three times a day (TID) | ORAL | Status: DC | PRN

## 2022-06-19 MED ORDER — BUSPIRONE HCL 10 MG PO TABS: 10.0000 mg | ORAL_TABLET | Freq: Two times a day (BID) | ORAL | 0 refills | Status: DC

## 2022-06-19 MED ORDER — LORAZEPAM 0.5 MG PO TABS
0.5000 mg | ORAL_TABLET | ORAL | Status: AC
  Administered 2022-06-19: 0.5 mg via ORAL
  Filled 2022-06-19: qty 1

## 2022-06-19 MED ORDER — ACETAMINOPHEN 325 MG PO TABS
650.0000 mg | ORAL_TABLET | ORAL | Status: DC | PRN
  Administered 2022-06-19: 650 mg via ORAL
  Filled 2022-06-19: qty 2

## 2022-06-19 NOTE — ED Provider Notes (Signed)
[facility] Provider Note    Event Date/Time   First MD Initiated Contact with Patient 06/19/22 1747     (approximate)   History   Chief Complaint: Mental Health Problem   HPI  Aaron Gibbs is a 88 y.o. male with a history of chronic pain syndrome, COPD, anxiety who comes ED complaining of anxiety and panic attacks for the last 3 days.  He states this makes him feel, suicidal.  He has no plan or intention to harm himself.  No HI or hallucinations.  No overdose or self-injurious behaviors.  I have previously seen this patient for anxiety a few weeks ago.  He has since followed up with his primary care doctor as reflected in the electronic medical record on outside record review.  They were referring him to heme-onc and starting him on iron for anemia, and have initiated buspirone in addition to his as needed Ativan.     Physical Exam   Triage Vital Signs: ED Triage Vitals  Enc Vitals Group     BP 06/19/22 1723 (!) 175/104     Pulse Rate 06/19/22 1723 93     Resp 06/19/22 1723 20     Temp 06/19/22 1723 98.2 F (36.8 C)     Temp Source 06/19/22 1723 Oral     SpO2 06/19/22 1723 99 %     Weight 06/19/22 1721 240 lb 4.8 oz (109 kg)     Height 06/19/22 1721 '5\' 11"'$  (1.803 m)     Head Circumference --      Peak Flow --      Pain Score 06/19/22 1721 0     Pain Loc --      Pain Edu? --      Excl. in [place]? --     Most recent vital signs: Vitals:   06/19/22 1723  BP: (!) 175/104  Pulse: 93  Resp: 20  Temp: 98.2 F (36.8 C)  SpO2: 99%    General: Awake, no distress.  Anxious. CV:  Good peripheral perfusion.  Regular rate rhythm, normal distal pulses Resp:  Normal effort.  Auscultation bilaterally Abd:  No distention.  Moist oral mucosa Other:  Lucid, oriented   ED Results / Procedures / Treatments   Labs (all labs ordered are listed, but only abnormal results are displayed) Labs Reviewed  COMPREHENSIVE METABOLIC PANEL - Abnormal; Notable  for the following components:      Result Value   Glucose, Bld 137 (*)    All other components within normal limits  SALICYLATE LEVEL - Abnormal; Notable for the following components:   Salicylate Lvl <6.0 (*)    All other components within normal limits  ACETAMINOPHEN LEVEL - Abnormal; Notable for the following components:   Acetaminophen (Tylenol), Serum <10 (*)    All other components within normal limits  CBC - Abnormal; Notable for the following components:   WBC 3.3 (*)    Hemoglobin 11.2 (*)    HCT 38.2 (*)    MCH 25.2 (*)    MCHC 29.3 (*)    RDW 21.4 (*)    Platelets 134 (*)    All other components within normal limits  ETHANOL  URINE DRUG SCREEN, QUALITATIVE ([facility] ONLY)     EKG    RADIOLOGY    PROCEDURES:  Procedures   MEDICATIONS ORDERED IN ED: Medications  ondansetron (ZOFRAN) tablet 4 mg (has no administration in time range)  alum & mag hydroxide-simeth (MAALOX/MYLANTA) 200-200-20 MG/5ML suspension 30 mL (  has no administration in time range)  acetaminophen (TYLENOL) tablet 650 mg (650 mg Oral Given 06/19/22 2003)  LORazepam (ATIVAN) tablet 0.5 mg (has no administration in time range)     IMPRESSION / MDM / ASSESSMENT AND PLAN / ED COURSE  I reviewed the triage vital signs and the nursing notes.                              Differential diagnosis includes, but is not limited to, anxiety, ingestion, intoxication, worsened anemia, electrolyte abnormality  Patient's presentation is most consistent with acute presentation with potential threat to life or bodily function.  Patient presents with anxiety.  No serious suicidal ideation or intent to harm himself.  No other imminent safety concerns.  He is psychiatrically stable.  Vital signs are unremarkable, exam is benign.  No other acute medical complaints.  I think this is related to his chronic anxiety.  Labs today are reassuring.  Toxicology workup unremarkable.  Hemoglobin level is 11, improved from 9  previously.  Reviewed EMR which confirms that patient was prescribed a month worth of Ativan to take as needed in mid November with 2 refills.  He should have access to this medication through his pharmacy.  Will give him Ativan here, discharge home to continue outpatient follow-up.       FINAL CLINICAL IMPRESSION(S) / ED DIAGNOSES   Final diagnoses:  Anxiety     Rx / DC Orders   ED Discharge Orders     None        Note:  This document was prepared using Dragon voice recognition software and may include unintentional dictation errors.   Carrie Mew, MD 06/19/22 2027

## 2022-06-19 NOTE — ED Notes (Signed)
Patient restless constantly requesting to be moved from the bed to the chair. Patient redirected multiple times. Complaining of "panic attacks." PRN ativan given awaiting results. Complained of knee pain. PRN tylenol given.

## 2022-06-19 NOTE — ED Notes (Signed)
RN spoke with patients daughter about patient being up for discharge. Patients daughter reported that she cannot provide transport tonight. Per daughter caregiver cannot drive at night and cannot transport till morning.

## 2022-06-19 NOTE — Discharge Instructions (Signed)
Contact your pharmacy to request a refill of your Ativan

## 2022-06-19 NOTE — ED Triage Notes (Signed)
Pt reports is suicidal and has had a panic attack for 3 days straight. Denies a plan.

## 2022-06-20 ENCOUNTER — Ambulatory Visit: Payer: Self-pay

## 2022-06-20 ENCOUNTER — Telehealth: Payer: Self-pay | Admitting: Pain Medicine

## 2022-06-20 ENCOUNTER — Other Ambulatory Visit: Payer: Self-pay

## 2022-06-20 ENCOUNTER — Emergency Department (EMERGENCY_DEPARTMENT_HOSPITAL)
Admission: EM | Admit: 2022-06-20 | Discharge: 2022-06-20 | Disposition: A | Discharge status: Home / Self Care | Payer: Medicare Other | Source: Home / Self Care | Attending: Emergency Medicine | Admitting: Emergency Medicine

## 2022-06-20 DIAGNOSIS — R45851 Suicidal ideations: Secondary | ICD-10-CM | POA: Insufficient documentation

## 2022-06-20 DIAGNOSIS — Z87891 Personal history of nicotine dependence: Secondary | ICD-10-CM | POA: Insufficient documentation

## 2022-06-20 DIAGNOSIS — J449 Chronic obstructive pulmonary disease, unspecified: Secondary | ICD-10-CM | POA: Insufficient documentation

## 2022-06-20 DIAGNOSIS — F418 Other specified anxiety disorders: Secondary | ICD-10-CM | POA: Insufficient documentation

## 2022-06-20 DIAGNOSIS — F411 Generalized anxiety disorder: Secondary | ICD-10-CM | POA: Diagnosis not present

## 2022-06-20 DIAGNOSIS — F410 Panic disorder [episodic paroxysmal anxiety] without agoraphobia: Secondary | ICD-10-CM | POA: Diagnosis present

## 2022-06-20 DIAGNOSIS — Z85828 Personal history of other malignant neoplasm of skin: Secondary | ICD-10-CM | POA: Insufficient documentation

## 2022-06-20 DIAGNOSIS — F32A Depression, unspecified: Secondary | ICD-10-CM

## 2022-06-20 MED ORDER — LORAZEPAM 0.5 MG PO TABS
0.5000 mg | ORAL_TABLET | ORAL | Status: AC
  Administered 2022-06-20: 0.5 mg via ORAL
  Filled 2022-06-20: qty 1

## 2022-06-20 MED ORDER — LORAZEPAM 0.5 MG PO TABS: 0.5000 mg | ORAL_TABLET | Freq: Once | ORAL | Status: DC

## 2022-06-20 MED ORDER — BUSPIRONE HCL 5 MG PO TABS
10.0000 mg | ORAL_TABLET | ORAL | Status: AC
  Administered 2022-06-20: 10 mg via ORAL
  Filled 2022-06-20: qty 2

## 2022-06-20 NOTE — Telephone Encounter (Signed)
Message from Marlis Edelson sent at 06/20/2022 12:37 PM EST  Summary: rx concern   The patient's friend [name] has called regarding their busPIRone (BUSPAR) 10 MG tablet [[id number]] prescription as well as potential interactions with their prescription for their LORazepam (ATIVAN) 0.5 MG tablet [[id number]]    The patient's friend is concerned with the effectiveness of the medication  The patient's friend would like to speak with a member of clinical staff when possible regarding the patient's behavioral health and medications  Please contact further when possible         Chief Complaint: med schedule and when to give Ativan Symptoms: wakefulness and anxiety at night Frequency: - Pertinent Negatives: Patient denies  Disposition: '[]'$ ED /'[]'$ Urgent Care (no appt availability in office) / '[]'$ Appointment(In office/virtual)/ '[]'$  [facility]/ '[x]'$ Home Care/ '[]'$ Refused Recommended Disposition /'[]'$  Mobile Bus/ '[]'$  Follow-up with PCP Additional Notes: Advised Charlie to give pt his Buspar and other meds that are scheduled at 0800 and 2000. If at anytime dueing the day she notes pt having anxiety, give Ativan. Advised to try this first.  Answer Assessment - Initial Assessment Questions 1. NAME of MEDICINE: "What medicine(s) are you calling about?"     Ativan and Buspar 2. QUESTION: "What is your question?" (e.g., double dose of medicine, side effect)     Caregiver [name] stated that she has to wake him at 9 pm to give him his medications and it dreates anxiety.  3. PRESCRIBER: "Who prescribed the medicine?" Reason: if prescribed by specialist, call should be referred to that group.     Dr Joni Fears and Dr Parks Ranger 4. SYMPTOMS: "Do you have any symptoms?" If Yes, ask: "What symptoms are you having?"  "How bad are the symptoms (e.g., mild, moderate, severe)     no 5. PREGNANCY:  "Is there any chance that you are pregnant?" "When was your last menstrual period?"      N/a  Protocols used: Medication Question Call-A-AH

## 2022-06-20 NOTE — ED Notes (Signed)
Voluntary with psych consult pending

## 2022-06-20 NOTE — ED Notes (Signed)
Pt given his dinner tray but pt refused stating he is not hungry. Pt given water.

## 2022-06-20 NOTE — ED Notes (Addendum)
Pt signed printed d/c paperwork. Pt refused last set of vital signs.

## 2022-06-20 NOTE — Telephone Encounter (Signed)
Caretaker notified that it was ok for him to take the Buspar per Dr Dossie Arbour.

## 2022-06-20 NOTE — Telephone Encounter (Signed)
Summary: Pt caregiver concerned because patient was prescribed another anxiety medication by the hospital   Aaron Gibbs (caregiver) requests call back because the patient was prescribed another anxiety medication by the hospital and patient is already taking 3-4 medications for anxiety. Aaron Gibbs requests return call to discuss. Cb# [place]

## 2022-06-20 NOTE — Telephone Encounter (Signed)
  Chief Complaint: suicide  Symptoms: pt tried wrapping telephone cord around neck  Frequency: this morning  Pertinent Negatives: NA Disposition: '[]'$ ED /'[]'$ Urgent Care (no appt availability in office) / '[]'$ Appointment(In office/virtual)/ '[]'$  [facility]/ '[]'$ Home Care/ '[]'$ Refused Recommended Disposition /'[]'$ [place] [facility]/ '[x]'$  Follow-up with PCP Additional Notes: Charlie, pts caregiver calling to let us know that medication list wasn't correct and wanted to make sure nothing happened since pt was sent back to ED. She was stating that BUSPAR was not on medication list as '5mg'$  TID. I advised her that ED provider increased dose and changed to BID. She verbalized understanding and said she was just concerned and upset since pt attempted to try and kill self. Pt is still currently at ED. Will route to provider as FYI.   Answer Assessment - Initial Assessment Questions 1. MAIN CONCERN: "What happened that made you call today?"     Pt voiced to caregiver he tried to kill himself  2. RISK OF HARM - SUICIDAL IDEATION:  "Do you ever have thoughts of hurting or killing yourself?"  (e.g., yes, no, no but preoccupation with thoughts about death)   - WISH TO BE DEAD:  "Have you wished you were dead or wished you could go to sleep and not wake up?"   - INTENT:  "Have you had any thoughts of hurting or killing yourself?" (e.g., yes, no, N/A) If Yes, ask: "Are you having these thoughts about killing yourself right NOW?"   - PLAN: "Have you thought about how you might do this?" "Do you have a specific plan for how you would do this?" (e.g., gun, knife, overdose, no plan, N/A)   - ACCESS: If yes to PLAN, "Do you have access to ?" (e.g., pills, gun in house, knife in kitchen)     Yes, caregiver said tried to use cord of phone around neck  3. RISK OF HARM - SUICIDE ATTEMPT: "Have you tried to harm yourself recently?" If Yes, ask: When was this?"  "What type of harm was tried?"     Per caregiver, yes  4.  RISK OF HARM - SUICIDAL BEHAVIOR: "Have you ever done anything, started to do anything, or prepared to do anything to end your life?" (e.g., collected pills, bought a gun, wrote a suicide note, cut yourself, started but changed your mind)     unsure 5. EVENTS AND STRESSORS: "Has there been any new stress or recent changes in your life?" (e.g., death of loved one, homelessness, negative event, relationship breakup, work)     Panic attacks and anxiety  7. SUPPORT: "Who is with you now?" "Who do you live with?" "Do you have family or friends who you can talk to?"      Caregiver and daugher  Protocols used: Suicide Concerns-A-AH

## 2022-06-20 NOTE — ED Notes (Signed)
Pt awake, at baseline, awaiting ride home. Caregiver contacted and will be here soon.

## 2022-06-20 NOTE — ED Provider Notes (Signed)
   [facility] Provider Note    Event Date/Time   First MD Initiated Contact with Patient 06/20/22 1342     (approximate)   History   SI   HPI  Aaron Gibbs is a 88 y.o. male who presents to the emergency department today with concerns for continued suicidal ideation.  Patient was seen in the emergency department last night for the same symptoms.  He says that he tried to choke himself with a cord. He was prescribed a new medication but states he did not take any medications since leaving the hospital.      Physical Exam   Triage Vital Signs: ED Triage Vitals  Enc Vitals Group     BP 06/20/22 1257 (!) 149/77     Pulse Rate 06/20/22 1257 74     Resp 06/20/22 1257 16     Temp 06/20/22 1257 98 F (36.7 C)     Temp Source 06/20/22 1257 Oral     SpO2 06/20/22 1257 98 %     Weight --      Height --      Head Circumference --      Peak Flow --      Pain Score 06/20/22 1235 0     Pain Loc --      Pain Edu? --      Excl. in [place]? --     Most recent vital signs: Vitals:   06/20/22 1257  BP: (!) 149/77  Pulse: 74  Resp: 16  Temp: 98 F (36.7 C)  SpO2: 98%   General: Awake, alert, oriented. CV:  Good peripheral perfusion. Regular rate and rhythm. Resp:  Normal effort. Lungs clear. Abd:  No distention.     ED Results / Procedures / Treatments   Labs (all labs ordered are listed, but only abnormal results are displayed) Labs Reviewed - No data to display   EKG  None   RADIOLOGY None   PROCEDURES:  Critical Care performed: No  Procedures   MEDICATIONS ORDERED IN ED: Medications  busPIRone (BUSPAR) tablet 10 mg (has no administration in time range)  LORazepam (ATIVAN) tablet 0.5 mg (has no administration in time range)     IMPRESSION / MDM / ASSESSMENT AND PLAN / ED COURSE  I reviewed the triage vital signs and the nursing notes.                              Differential diagnosis includes, but is not limited to,  depression, malingering.   Patient's presentation is most consistent with acute presentation with potential threat to life or bodily function.  Patient presented to the emergency department with continued concerns for suicidal ideation.  He left the emergency department earlier today.  Did not try taking any medications at home for this.  Will have psychiatry reevaluate.  The patient has been placed in psychiatric observation due to the need to provide a safe environment for the patient while obtaining psychiatric consultation and evaluation, as well as ongoing medical and medication management to treat the patient's condition.  The patient has not been placed under full IVC at this time.  FINAL CLINICAL IMPRESSION(S) / ED DIAGNOSES   Final diagnoses:  Depression, unspecified depression type    Note:  This document was prepared using Dragon voice recognition software and may include unintentional dictation errors.    Nance Pear, MD 06/20/22 [phone]

## 2022-06-20 NOTE — ED Notes (Signed)
Louise NP with psych team at bedside talking with pt.

## 2022-06-20 NOTE — Telephone Encounter (Signed)
Patient has been to the hospital and was given a script for Buspar, generic. Caretaker is asking if this is ok to take along with the other medications prescribed for him? Please advise caretaker [name]

## 2022-06-20 NOTE — ED Provider Notes (Addendum)
Patient has been cleared by psychiatry.  Will discharge.   Rada Hay, MD 06/20/22 1604    Rada Hay, MD 06/20/22 [phone]

## 2022-06-20 NOTE — Consult Note (Addendum)
[place] [facility] Consult   Reason for Consult:  anxiety Referring Physician:  EDP Patient Identification: Aaron Gibbs MRN:  [id number] Principal Diagnosis: Anxiety state Diagnosis:  Principal Problem:   Anxiety state   Total Time spent with patient: 30 minutes  Subjective:   Aaron Gibbs is a 88 y.o. male patient admitted with anxiety and suicidal thoughts.  HPI:  Patient left the ED less than 12 hours ago after presenting for anxiety. He was given a prescription for buspirone and the caregiver did not want to give it before talking with his outpatient provider. Before caregiver could talk to his provider, patient said that he was still anxious and felt like killing himself, and he thought about putting a cord around his neck but he was unable to do it. Caregiver then brought him back to the ED.   On evaluation, patient is somewhat anxious. He says he is legally blind, sees shadows. He tells this wrier that he thought about putting a cord around his neck, but he was "unable to maneuver how to do that." He denies current thought or intent of suicide. Denies auditory or visual hallucinations or homicidal ideations. He states that he is just really anxious and that is what makes him feel like dying.  Discussion regarding other strategies to assist in reducing anxiety. Writer order Ativan and Bupsar to be given now.   Patient had appreciable improvement in his anxiety and is agreeable to go home. Denies suicidal thought or intent at this time. He will follow up with outpatient provider and family/caregiver will look for therapist.   Writer spoke with caregiver,  Reina Fuse, [phone] and daughter, Cornelio Parkerson , [phone]: Both agree that psychiatric hospitalization will unlikely be of any benefit to him. Discussion regarding safety measures. Caregiver has taken all cords out of patient's room and has . No firearms in home. It would be unlikely that patient could harm  himself due to his lack of visual acuity and caregiver states she will be with him all the time. They will return to ED if symptoms progress. Will contact outpatient provider and look for therapist.  Past Psychiatric History: Anxiety, depression; past hx of alcoholism, opioid dependence   Risk to Self:   Risk to Others:   Prior Inpatient Therapy:   Prior Outpatient Therapy:    Past Medical History:  Past Medical History:  Diagnosis Date   Anxiety    Cancer ([place])    skin   COPD (chronic obstructive pulmonary disease) ([place])    Glaucoma    Osteoporosis    osteopenia    Past Surgical History:  Procedure Laterality Date   CHOLECYSTECTOMY     TRANSURETHRAL RESECTION OF PROSTATE N/A 2008   Family History:  Family History  Problem Relation Age of Onset   Depression Mother    Heart disease Father    Family Psychiatric  History:  Social History:  Social History   Substance and Sexual Activity  Alcohol Use Not Currently   Comment: past     Social History   Substance and Sexual Activity  Drug Use Never    Social History   Socioeconomic History   Marital status: Divorced    Spouse name: Not on file   Number of children: Not on file   Years of education: Not on file   Highest education level: Not on file  Occupational History   Occupation: retired  Tobacco Use   Smoking status: Former    Years: 20.00  Types: Cigarettes   Smokeless tobacco: Current    Types: Chew   Tobacco comments:    stopped 15 years ago  Vaping Use   Vaping Use: Never used  Substance and Sexual Activity   Alcohol use: Not Currently    Comment: past   Drug use: Never   Sexual activity: Not Currently  Other Topics Concern   Not on file  Social History Narrative   Not on file   Social Determinants of Health   Financial Resource Strain: Low Risk  (07/27/2020)   Overall Financial Resource Strain (CARDIA)    Difficulty of Paying Living Expenses: Not hard at all  Food Insecurity: No Food  Insecurity (07/27/2020)   Hunger Vital Sign    Worried About Running Out of Food in the Last Year: Never true    [place] in the Last Year: Never true  Transportation Needs: No Transportation Needs (07/27/2020)   PRAPARE - Hydrologist (Medical): No    Lack of Transportation (Non-Medical): No  Physical Activity: Inactive (07/27/2020)   Exercise Vital Sign    Days of Exercise per Week: 0 days    Minutes of Exercise per Session: 0 min  Stress: No Stress Concern Present (07/27/2020)   [place]    Feeling of Stress : Not at all  Social Connections: Unknown (09/11/2019)   Social Connection and Isolation Panel [NHANES]    Frequency of Communication with Friends and Family: Not on file    Frequency of Social Gatherings with Friends and Family: Not on file    Attends Religious Services: Not on file    Active Member of Clubs or Organizations: Not on file    Attends Archivist Meetings: Not on file    Marital Status: Divorced   Additional Social History:    Allergies:   Allergies  Allergen Reactions   Azithromycin Shortness Of Breath    Labs:  Results for orders placed or performed during the hospital encounter of 06/19/22 (from the past 48 hour(s))  Comprehensive metabolic panel     Status: Abnormal   Collection Time: 06/19/22  5:23 PM  Result Value Ref Range   Sodium 141 135 - 145 mmol/L   Potassium 3.9 3.5 - 5.1 mmol/L   Chloride 110 98 - 111 mmol/L   CO2 24 22 - 32 mmol/L   Glucose, Bld 137 (H) 70 - 99 mg/dL    Comment: Glucose reference range applies only to samples taken after fasting for at least 8 hours.   BUN 19 8 - 23 mg/dL   Creatinine, Ser 1.17 0.61 - 1.24 mg/dL   Calcium 9.1 8.9 - 10.3 mg/dL   Total Protein 7.4 6.5 - 8.1 g/dL   Albumin 4.0 3.5 - 5.0 g/dL   AST 33 15 - 41 U/L   ALT 24 0 - 44 U/L   Alkaline Phosphatase 63 38 - 126 U/L   Total Bilirubin 0.8 0.3 -  1.2 mg/dL   GFR, Estimated >60 >60 mL/min    Comment: (NOTE) Calculated using the CKD-EPI Creatinine Equation (2021)    Anion gap 7 5 - 15    Comment: Performed at [facility], [address] [place], [place] [address]  Ethanol     Status: None   Collection Time: 06/19/22  5:23 PM  Result Value Ref Range   Alcohol, Ethyl (B) <10 <10 mg/dL    Comment: (NOTE) Lowest detectable  limit for serum alcohol is 10 mg/dL.  For medical purposes only. Performed at [facility], [address] [place], [place] [address]   Salicylate level     Status: Abnormal   Collection Time: 06/19/22  5:23 PM  Result Value Ref Range   Salicylate Lvl <3.6 (L) 7.0 - 30.0 mg/dL    Comment: Performed at [facility], [address] [place], [place] [address]  Acetaminophen level     Status: Abnormal   Collection Time: 06/19/22  5:23 PM  Result Value Ref Range   Acetaminophen (Tylenol), Serum <10 (L) 10 - 30 ug/mL    Comment: (NOTE) Therapeutic concentrations vary significantly. A range of 10-30 ug/mL  may be an effective concentration for many patients. However, some  are best treated at concentrations outside of this range. Acetaminophen concentrations >150 ug/mL at 4 hours after ingestion  and >50 ug/mL at 12 hours after ingestion are often associated with  toxic reactions.  Performed at [facility], [address] [place], [place] [address]   cbc     Status: Abnormal   Collection Time: 06/19/22  5:23 PM  Result Value Ref Range   WBC 3.3 (L) 4.0 - 10.5 K/uL   RBC 4.45 4.22 - 5.81 MIL/uL   Hemoglobin 11.2 (L) 13.0 - 17.0 g/dL   HCT 38.2 (L) 39.0 - 52.0 %   MCV 85.8 80.0 - 100.0 fL   MCH 25.2 (L) 26.0 - 34.0 pg   MCHC 29.3 (L) 30.0 - 36.0 g/dL   RDW 21.4 (H) 11.5 - 15.5 %   Platelets 134 (L) 150 - 400 K/uL   nRBC 0.0 0.0 - 0.2 %    Comment: Performed at [facility], [address] [place], [place] [address]  Urine Drug Screen, Qualitative      Status: Abnormal   Collection Time: 06/19/22  7:22 PM  Result Value Ref Range   Tricyclic, Ur Screen POSITIVE (A) NONE DETECTED   Amphetamines, Ur Screen NONE DETECTED NONE DETECTED   MDMA (Ecstasy)Ur Screen NONE DETECTED NONE DETECTED   Cocaine Metabolite,Ur [place] NONE DETECTED NONE DETECTED   Opiate, Ur Screen NONE DETECTED NONE DETECTED   Phencyclidine (PCP) Ur S NONE DETECTED NONE DETECTED   Cannabinoid 50 Ng, Ur [place] NONE DETECTED NONE DETECTED   Barbiturates, Ur Screen NONE DETECTED NONE DETECTED   Benzodiazepine, Ur Scrn NONE DETECTED NONE DETECTED   Methadone Scn, Ur NONE DETECTED NONE DETECTED    Comment: (NOTE) Tricyclics + metabolites, urine    Cutoff 1000 ng/mL Amphetamines + metabolites, urine  Cutoff 1000 ng/mL MDMA (Ecstasy), urine              Cutoff 500 ng/mL Cocaine Metabolite, urine          Cutoff 300 ng/mL Opiate + metabolites, urine        Cutoff 300 ng/mL Phencyclidine (PCP), urine         Cutoff 25 ng/mL Cannabinoid, urine                 Cutoff 50 ng/mL Barbiturates + metabolites, urine  Cutoff 200 ng/mL Benzodiazepine, urine              Cutoff 200 ng/mL Methadone, urine                   Cutoff 300 ng/mL  The urine drug screen provides only a preliminary, unconfirmed analytical test result and should not be used for non-medical purposes. Clinical consideration  and professional judgment should be applied to any positive drug screen result due to possible interfering substances. A more specific alternate chemical method must be used in order to obtain a confirmed analytical result. Gas chromatography / mass spectrometry (GC/MS) is the preferred confirm atory method. Performed at [facility], [address] [place], [place] [address]     No current facility-administered medications for this encounter.   Current Outpatient Medications  Medication Sig Dispense Refill   acetaminophen (TYLENOL) 500 MG tablet Take 1,000 mg by mouth in the  morning, at noon, and at bedtime.     amitriptyline (ELAVIL) 10 MG tablet Take 1 tablet (10 mg total) by mouth at bedtime. 90 tablet 1   bumetanide (BUMEX) 1 MG tablet Take 0.5 tablets (0.5 mg total) by mouth 2 (two) times daily. 90 tablet 1   buprenorphine (BUTRANS) 10 MCG/HR PTWK Place 1 patch onto the skin once a week for 28 days. Apply only 1 patch at a time and alternate sites weekly. 4 patch 0   [START ON 06/21/2022] buprenorphine (BUTRANS) 10 MCG/HR PTWK Place 1 patch onto the skin once a week for 28 days. Apply only 1 patch at a time and alternate sites weekly. 4 patch 0   [START ON 07/19/2022] buprenorphine (BUTRANS) 10 MCG/HR PTWK Place 1 patch onto the skin once a week for 28 days. Apply only 1 patch at a time and alternate sites weekly. 4 patch 0   [START ON 08/16/2022] buprenorphine (BUTRANS) 10 MCG/HR PTWK Place 1 patch onto the skin once a week for 28 days. Apply only 1 patch at a time and alternate sites weekly. 4 patch 0   busPIRone (BUSPAR) 10 MG tablet Take 1 tablet (10 mg total) by mouth 2 (two) times daily. 60 tablet 0   carvedilol (COREG) 12.5 MG tablet Take 1 tablet (12.5 mg total) by mouth 2 (two) times daily. 180 tablet 1   cetirizine (ZYRTEC) 10 MG tablet Take 10 mg by mouth daily. (Patient not taking: Reported on 06/09/2022)     clotrimazole-betamethasone (LOTRISONE) cream Apply topically 2 (two) times daily. For 7-10 days (Patient not taking: Reported on 06/19/2022) 30 g 2   COSOPT 22.3-6.8 MG/ML ophthalmic solution Place 1 drop into both eyes 2 (two) times daily.     diclofenac Sodium (VOLTAREN) 1 % GEL Apply 2 g topically at bedtime. Apply to both knees twice daily 350 g 0   DULoxetine (CYMBALTA) 30 MG capsule Take 3 capsules (90 mg total) by mouth daily. 270 capsule 1   finasteride (PROSCAR) 5 MG tablet Take 1 tablet (5 mg total) by mouth daily. 90 tablet 3   gabapentin (NEURONTIN) 300 MG capsule Take 1 capsule (300 mg total) by mouth daily. 90 capsule 3   Homeopathic  Products (ARNICARE) GEL Apply topically.     Iron, Ferrous Sulfate, 325 (65 Fe) MG TABS Take 325 mg by mouth daily with supper. 30 tablet    latanoprost (XALATAN) 0.005 % ophthalmic solution Place 1 drop into both eyes at bedtime.     LORazepam (ATIVAN) 0.5 MG tablet Take 1 tablet (0.5 mg total) by mouth 2 (two) times daily as needed for anxiety. 60 tablet 2   losartan (COZAAR) 50 MG tablet Take 1 tablet (50 mg total) by mouth daily. 90 tablet 1   Melatonin 5 MG CAPS Take 1 capsule (5 mg total) by mouth at bedtime.  0   meloxicam (MOBIC) 15 MG tablet Take 15 mg by mouth daily.  nystatin (MYCOSTATIN/NYSTOP) powder Apply 1 Application topically 3 (three) times daily. For 2-4 weeks. 15 g 2   OLANZapine (ZYPREXA) 2.5 MG tablet Take 1 tablet (2.5 mg total) by mouth 2 (two) times daily. 180 tablet 1   pantoprazole (PROTONIX) 40 MG tablet Take 1 tablet (40 mg total) by mouth daily. 90 tablet 3   Propylene Glycol (SYSTANE BALANCE OP) Place 1 drop into the left eye daily.     tamsulosin (FLOMAX) 0.4 MG CAPS capsule Take 1 capsule (0.4 mg total) by mouth daily. 90 capsule 1   traZODone (DESYREL) 150 MG tablet Take 1 tablet (150 mg total) by mouth at bedtime as needed. 90 tablet 1    Musculoskeletal: Strength & Muscle Tone:  did not observe Gait & Station:  did not observe Patient leans: N/A    Psychiatric Specialty Exam:  Presentation  General Appearance: Casual  Eye Contact:-- (patient is blind)  Speech:Clear and Coherent  Speech Volume:Normal  Handedness:Right   Mood and Affect  Mood:Anxious  Affect:Blunt   Thought Process  Thought Processes:Coherent  Descriptions of Associations:Intact  Orientation:Full (Time, Place and Person)  Thought Content:WDL  History of Schizophrenia/Schizoaffective disorder:No data recorded Duration of Psychotic Symptoms:No data recorded Hallucinations:Hallucinations: None  Ideas of Reference:None  Suicidal Thoughts:Suicidal Thoughts:  No  Homicidal Thoughts:Homicidal Thoughts: No   Sensorium  Memory:Immediate Good  Judgment:Fair  Insight:Fair   Executive Functions  Concentration:Fair  Attention Span:Fair  [place]   Psychomotor Activity  Psychomotor Activity:Psychomotor Activity: Normal   Assets  Assets:Communication Skills; Financial Resources/Insurance; Housing; Desire for Improvement; Resilience; Social Support   Sleep  Sleep:Sleep: Fair   Physical Exam: Physical Exam Vitals and nursing note reviewed.  HENT:     Head: Normocephalic.     Nose: No congestion or rhinorrhea.  Cardiovascular:     Rate and Rhythm: Normal rate.  Pulmonary:     Effort: Pulmonary effort is normal.  Musculoskeletal:        General: Normal range of motion.     Cervical back: Normal range of motion.  Skin:    General: Skin is dry.  Neurological:     Mental Status: He is alert and oriented to person, place, and time.  Psychiatric:        Attention and Perception: Attention normal.        Mood and Affect: Mood is anxious.        Behavior: Behavior is cooperative.        Thought Content: Thought content is not paranoid or delusional. Thought content does not include homicidal or suicidal ideation.        Cognition and Memory: Cognition normal.        Judgment: Judgment normal.    Review of Systems  HENT: Negative.    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    Skin: Negative.   Psychiatric/Behavioral:  Positive for depression (stable). Negative for hallucinations, memory loss, substance abuse (hx of) and suicidal ideas. The patient is nervous/anxious (at baseline). The patient does not have insomnia.    Blood pressure (!) 149/77, pulse 74, temperature 98 F (36.7 C), temperature source Oral, resp. rate 16, SpO2 98 %. There is no height or weight on file to calculate BMI.  Treatment Plan Summary: Plan Patient given 0.5 mg  Ativan and 10 mg Buspar in the ED. He did not start the Buspar today d/t caregiver wanting to confirm with his doctor.  Patient does noto meet criteria for inpatient psych hospitalization. Referred patient's caregiver and daughter to website "[email or web address]" to find therapist to assist patient in managing his anxiety.  Patient is legally blind and has a full-time caregiver, who verbalizes that she can provide a safe environment. Reviewed plan for discharge with Dr. Starleen Blue.    Disposition: No evidence of imminent risk to self or others at present.   Patient does not meet criteria for psychiatric inpatient admission. Supportive therapy provided about ongoing stressors. Discussed crisis plan, support from social network, calling 911, coming to the Emergency Department, and calling Suicide Hotline.  Sherlon Handing, NP 06/20/2022 4:45 PM

## 2022-06-20 NOTE — ED Notes (Addendum)
Pt assisted with urinal as requested; pt urinated only about 30cc; amber in color. Attempted to set up telesitter however staff at telesitter center state "we don't watch people in the hallway" and refused to watch pt via [place]. When asked why they stated "bc we have to call out commands" and continued to refuse to watch pt.

## 2022-06-20 NOTE — ED Notes (Signed)
Pt laying down in bed; alert; calm.

## 2022-06-20 NOTE — ED Triage Notes (Signed)
Pt comes with c/o SI. Pt was just discharged from here this am. Pt states he went home and had to come back. Pt states it has been going on since he left. Pt states no HI.

## 2022-06-20 NOTE — ED Notes (Addendum)
Per Barbaraann Share NP pt psych-cleared and to go home with caregiver today; daughter talked to provider over the phone; caregiver is on way per Barbaraann Share NP. EDP KRM aware.

## 2022-06-20 NOTE — ED Notes (Signed)
Pt's caregiver [name], was called by this RN for ride home. He will arrive to take pt home around 7:15

## 2022-06-20 NOTE — ED Notes (Signed)
Released from Emergency Room this AM to caregiver. Pt was here for anxiety and prescribed anxiety medication. Pt did not try prescribed medications before returning to the ED. States that he is suicidal due to "his mind" referring to anxiety. States that he would kill himself with knife he has in the home. Caregiver in home around the clock with the exception of when "she goes grocery shopping/"

## 2022-06-20 NOTE — Telephone Encounter (Signed)
Summary: rx concern   The patient's friend [name] has called regarding their busPIRone (BUSPAR) 10 MG tablet [[id number]] prescription as well as potential interactions with their prescription for their LORazepam (ATIVAN) 0.5 MG tablet [[id number]]    The patient's friend is concerned with the effectiveness of the medication  The patient's friend would like to speak with a member of clinical staff when possible regarding the patient's behavioral health and medications  Please contact further when possible        Called Charlie - [facility]

## 2022-06-20 NOTE — ED Notes (Signed)
Pt assisted with urinal again. Pt urinated about 100cc.

## 2022-06-20 NOTE — Telephone Encounter (Signed)
Summary: Pt caregiver concerned because patient was prescribed another anxiety medication by the hospital   Reina Fuse (caregiver) requests call back because the patient was prescribed another anxiety medication by the hospital and patient is already taking 3-4 medications for anxiety. Charlie requests return call to discuss. Cb# [place].

## 2022-06-20 NOTE — BH Assessment (Signed)
Comprehensive Clinical Assessment (CCA) Screening, Triage and Referral Note  06/20/2022 Aaron Gibbs [id number]  [name], 88 year old male who presents to [facility] ED voluntarily for treatment. Per triage note, Pt comes with c/o SI. Pt was just discharged from here this am. Pt states he went home and had to come back. Pt states it has been going on since he left. Pt states no HI.   During TTS assessment pt presents alert and oriented x 4, restless but cooperative, and mood-congruent with affect. The pt does not appear to be responding to internal or external stimuli. Neither is the pt presenting with any delusional thinking. Pt verified the information provided to triage RN.   Pt identifies his main complaint to be that something is wrong with his mind. Patient was released from [facility] ED earlier this morning but has returned because he is still anxious. Patient reports he was not given medication from caregiver. Patient reports he thought about tying an extension cord around his neck but was unable to do so. Patient states he informed the caregiver and she called 911. Patient is legally blind. Patient states he would like medications so that he can feel better. Pt denies current HI/AH/VH.     Psych Team spoke to patient's caregiver, Reina Fuse [phone]. [name] reports she was unsure about what medications to give patient and what dosage. She states she has since spoken to patient's provider and they discussed in depth what patient should be taking and the times patient will need his medication. [name] states she has removed all cords from patient's room and there are no firearms in the home. Caregiver has no concerns with patient returning to the home. "I am here at all times." It has been advised that patient also see therapist for additional support. Charlie verbalized understanding.    Per Barbaraann Share, NP, pt does not meet criteria for inpatient psychiatric admission.  Chief Complaint:   Chief Complaint  Patient presents with   SI   Visit Diagnosis: Anxiety state  Patient Reported Information How did you hear about [place]? Self  What Is the Reason for Your Visit/Call Today? Patient reports he was feeling anxious and thought about wanting to kill himself.  How Long Has This Been Causing You Problems? <Week  What Do You Feel Would Help You the Most Today? Medication(s)   Have You Recently Had Any Thoughts About Hurting Yourself? Yes  Are You Planning to Commit Suicide/Harm Yourself At This time? No   Have you Recently Had Thoughts About [place]? No  Are You Planning to Harm Someone at This Time? No  Explanation: No data recorded  Have You Used Any Alcohol or Drugs in the Past 24 Hours? No  How Long Ago Did You Use Drugs or Alcohol? No data recorded What Did You Use and How Much? No data recorded  Do You Currently Have a Therapist/Psychiatrist? No  Name of Therapist/Psychiatrist: No data recorded  Have You Been Recently Discharged From Any Office Practice or Programs? No  Explanation of Discharge From Practice/Program: No data recorded   CCA Screening Triage Referral Assessment Type of Contact: Face-to-Face  Telemedicine Service Delivery:   Is this Initial or Reassessment?   Date Telepsych consult ordered in [facility]:    Time Telepsych consult ordered in [facility]:    Location of Assessment: [facility] ED  Provider Location: [facility] ED    Collateral Involvement: Reina Fuse- caregiver [phone]   Does Patient Have a [place]?  No data recorded Name and Contact of Legal Guardian: No data recorded If Minor and Not Living with Parent(s), Who has Custody? n/a  Is CPS involved or ever been involved? Never  Is APS involved or ever been involved? Never   Patient Determined To Be At Risk for Harm To Self or Others Based on Review of Patient Reported Information or Presenting Complaint? No  Method: No data  recorded Availability of Means: No data recorded Intent: No data recorded Notification Required: No data recorded Additional Information for Danger to Others Potential: No data recorded Additional Comments for Danger to Others Potential: No data recorded Are There Guns or Other Weapons in Your Home? -- (Knives)  Types of Guns/Weapons: Knives  Are These Weapons Safely Secured?                            No data recorded Who Could Verify You Are Able To Have These Secured: No data recorded Do You Have any Outstanding Charges, Pending Court Dates, Parole/Probation? Pt denies  Contacted To Inform of Risk of Harm To Self or Others: No data recorded  Does Patient Present under Involuntary Commitment? No    [place] of Residence: [place]   Patient Currently Receiving the Following Services: Medication Management   Determination of Need: Emergent (2 hours)   Options For Referral: ED Visit; Medication Management; Outpatient Therapy   Discharge Disposition:     Eula Fried, Counselor, LCAS-A

## 2022-06-21 ENCOUNTER — Encounter: Payer: Self-pay | Admitting: Internal Medicine

## 2022-06-21 ENCOUNTER — Ambulatory Visit: Payer: Medicare Other | Admitting: Pain Medicine

## 2022-06-27 ENCOUNTER — Ambulatory Visit: Admission: RE | Admit: 2022-06-27 | Payer: Medicare Other | Source: Home / Self Care | Admitting: Orthopedic Surgery

## 2022-06-27 ENCOUNTER — Encounter: Admission: RE | Payer: Self-pay | Source: Home / Self Care

## 2022-06-27 SURGERY — ARTHROPLASTY, KNEE, TOTAL
Anesthesia: Spinal | Site: Knee | Laterality: Left

## 2022-07-04 ENCOUNTER — Ambulatory Visit (INDEPENDENT_AMBULATORY_CARE_PROVIDER_SITE_OTHER): Payer: Medicare Other | Admitting: Family Medicine

## 2022-07-04 ENCOUNTER — Encounter: Payer: Self-pay | Admitting: Family Medicine

## 2022-07-04 DIAGNOSIS — J441 Chronic obstructive pulmonary disease with (acute) exacerbation: Secondary | ICD-10-CM

## 2022-07-04 DIAGNOSIS — J432 Centrilobular emphysema: Secondary | ICD-10-CM

## 2022-07-04 DIAGNOSIS — F332 Major depressive disorder, recurrent severe without psychotic features: Secondary | ICD-10-CM

## 2022-07-04 DIAGNOSIS — F411 Generalized anxiety disorder: Secondary | ICD-10-CM | POA: Diagnosis not present

## 2022-07-04 DIAGNOSIS — R296 Repeated falls: Secondary | ICD-10-CM

## 2022-07-04 DIAGNOSIS — H547 Unspecified visual loss: Secondary | ICD-10-CM

## 2022-07-04 DIAGNOSIS — F410 Panic disorder [episodic paroxysmal anxiety] without agoraphobia: Secondary | ICD-10-CM

## 2022-07-04 MED ORDER — LEVOFLOXACIN 500 MG PO TABS: 500.0000 mg | ORAL_TABLET | Freq: Every day | ORAL | 0 refills | Status: DC

## 2022-07-04 MED ORDER — BUSPIRONE HCL 10 MG PO TABS: 10.0000 mg | ORAL_TABLET | Freq: Three times a day (TID) | ORAL | 3 refills | Status: DC | PRN

## 2022-07-04 MED ORDER — PSEUDOEPH-BROMPHEN-DM 30-2-10 MG/5ML PO SYRP: 5.0000 mL | ORAL_SOLUTION | Freq: Four times a day (QID) | ORAL | 0 refills | Status: DC | PRN

## 2022-07-04 MED ORDER — PREDNISONE 20 MG PO TABS: ORAL_TABLET | ORAL | 0 refills | Status: DC

## 2022-07-04 NOTE — Progress Notes (Signed)
Subjective:    Patient ID: Aaron Gibbs, male    DOB: 06-Feb-1935, 88 y.o.   MRN: [id number]  Aaron Gibbs is a 88 y.o. male presenting on 07/04/2022 for Anxiety   HPI  Generalized Anxiety Disorder, Panic Attack Major Depression recurrent severe Chronic history of mental health problems as we have followed and discussed over period of few years.  ED visit 1/1 and 1/2 for anxiety and mood. He has daily panic attack anxiety worsening. Previously unsuccessful getting him into Psychiatry for Geriatric, [facility] and [facility] can send Psych soon.  He admits panic attack. Usually panic in AM, 1 hour later after medicine. He may wake up 5am and has anxiety before medicines given 8am Panic attacks usually occur AM, afternoon, and evening. Buspar '10mg'$  TWICE A DAY currently does not last long enough Taking Amitriptyline '10mg'$  at night unsure if benefit Taking Trazodone nightly with relief for sleep.  Acute COPD Exacerbation / Cough Reports 2 days ago onset. Non productive. But feels deep chest congestion Denies any fever chills  Generalized Weakness History of Falls Using wheelchair, but can ambulate short distance if needed. Needing help with ADLs, has caregiver support at home but she has needed assistance.     07/04/2022    3:06 PM 06/06/2022    3:32 PM 05/02/2022    2:16 PM  Depression screen PHQ 2/9  Decreased Interest '2 1 2  '$ Down, Depressed, Hopeless '1 1 1  '$ PHQ - 2 Score '3 2 3  '$ Altered sleeping '1 3 3  '$ Tired, decreased energy '3 3 1  '$ Change in appetite '2 3 3  '$ Feeling bad or failure about yourself   0 0  Trouble concentrating 0 0 1  Moving slowly or fidgety/restless 0 0 0  Suicidal thoughts 0 0 0  PHQ-9 Score '9 11 11  '$ Difficult doing work/chores Somewhat difficult Not difficult at all Somewhat difficult      07/04/2022    3:07 PM 06/06/2022    3:33 PM 05/02/2022    2:16 PM 03/05/2020    3:16 PM  GAD 7 : Generalized Anxiety Score  Nervous, Anxious, on Edge  '3 3 3 3  '$ Control/stop worrying '3 3 3 2  '$ Worry too much - different things '2 3 3 2  '$ Trouble relaxing '2 3 3 2  '$ Restless '2 3 3 2  '$ Easily annoyed or irritable '2 3 3 1  '$ Afraid - awful might happen '2 3 3 3  '$ Total GAD 7 Score '16 21 21 15  '$ Anxiety Difficulty Somewhat difficult Somewhat difficult Somewhat difficult Somewhat difficult      Social History   Tobacco Use   Smoking status: Former    Years: 20.00    Types: Cigarettes   Smokeless tobacco: Current    Types: Chew   Tobacco comments:    stopped 15 years ago  Vaping Use   Vaping Use: Never used  Substance Use Topics   Alcohol use: Not Currently    Comment: past   Drug use: Never    Review of Systems Per HPI unless specifically indicated above     Objective:    There were no vitals taken for this visit.  Wt Readings from Last 3 Encounters:  06/19/22 240 lb 4.8 oz (109 kg)  06/16/22 240 lb 15.4 oz (109.3 kg)  06/09/22 241 lb (109.3 kg)    Physical Exam Vitals and nursing note reviewed.  Constitutional:      General: He is not in acute distress.  Appearance: Normal appearance. He is well-developed. He is obese. He is not diaphoretic.     Comments: Well-appearing, comfortable, cooperative  HENT:     Head: Normocephalic and atraumatic.  Eyes:     General:        Right eye: No discharge.        Left eye: No discharge.     Conjunctiva/sclera: Conjunctivae normal.  Cardiovascular:     Rate and Rhythm: Normal rate.  Pulmonary:     Effort: Pulmonary effort is normal.     Comments: Reduced air movement. Musculoskeletal:     Comments: Limited to wheelchair  Skin:    General: Skin is warm and dry.     Findings: No erythema or rash.  Neurological:     Mental Status: He is alert and oriented to person, place, and time.  Psychiatric:        Mood and Affect: Mood normal.        Behavior: Behavior normal.        Thought Content: Thought content normal.     Comments: Well groomed, good eye contact, normal speech  and thoughts     Results for orders placed or performed during the hospital encounter of 06/19/22  Comprehensive metabolic panel  Result Value Ref Range   Sodium 141 135 - 145 mmol/L   Potassium 3.9 3.5 - 5.1 mmol/L   Chloride 110 98 - 111 mmol/L   CO2 24 22 - 32 mmol/L   Glucose, Bld 137 (H) 70 - 99 mg/dL   BUN 19 8 - 23 mg/dL   Creatinine, Ser 1.17 0.61 - 1.24 mg/dL   Calcium 9.1 8.9 - 10.3 mg/dL   Total Protein 7.4 6.5 - 8.1 g/dL   Albumin 4.0 3.5 - 5.0 g/dL   AST 33 15 - 41 U/L   ALT 24 0 - 44 U/L   Alkaline Phosphatase 63 38 - 126 U/L   Total Bilirubin 0.8 0.3 - 1.2 mg/dL   GFR, Estimated >60 >60 mL/min   Anion gap 7 5 - 15  Ethanol  Result Value Ref Range   Alcohol, Ethyl (B) <85 <27 mg/dL  Salicylate level  Result Value Ref Range   Salicylate Lvl <7.8 (L) 7.0 - 30.0 mg/dL  Acetaminophen level  Result Value Ref Range   Acetaminophen (Tylenol), Serum <10 (L) 10 - 30 ug/mL  cbc  Result Value Ref Range   WBC 3.3 (L) 4.0 - 10.5 K/uL   RBC 4.45 4.22 - 5.81 MIL/uL   Hemoglobin 11.2 (L) 13.0 - 17.0 g/dL   HCT 38.2 (L) 39.0 - 52.0 %   MCV 85.8 80.0 - 100.0 fL   MCH 25.2 (L) 26.0 - 34.0 pg   MCHC 29.3 (L) 30.0 - 36.0 g/dL   RDW 21.4 (H) 11.5 - 15.5 %   Platelets 134 (L) 150 - 400 K/uL   nRBC 0.0 0.0 - 0.2 %  Urine Drug Screen, Qualitative  Result Value Ref Range   Tricyclic, Ur Screen POSITIVE (A) NONE DETECTED   Amphetamines, Ur Screen NONE DETECTED NONE DETECTED   MDMA (Ecstasy)Ur Screen NONE DETECTED NONE DETECTED   Cocaine Metabolite,Ur [place] NONE DETECTED NONE DETECTED   Opiate, Ur Screen NONE DETECTED NONE DETECTED   Phencyclidine (PCP) Ur S NONE DETECTED NONE DETECTED   Cannabinoid 50 Ng, Ur [place] NONE DETECTED NONE DETECTED   Barbiturates, Ur Screen NONE DETECTED NONE DETECTED   Benzodiazepine, Ur Scrn NONE DETECTED NONE DETECTED   Methadone Scn, Ur NONE DETECTED  NONE DETECTED      Assessment & Plan:   Problem List Items Addressed This Visit     COPD  (chronic obstructive pulmonary disease) ([place])   Relevant Medications   brompheniramine-pseudoephedrine-DM 30-2-10 MG/5ML syrup   predniSONE (DELTASONE) 20 MG tablet   Other Relevant Orders   AMB Referral to Chronic Care Management Services   Depression   Relevant Medications   busPIRone (BUSPAR) 10 MG tablet   Other Relevant Orders   AMB Referral to Chronic Care Management Services   Other Visit Diagnoses     Generalized anxiety disorder with panic attacks    -  Primary   Relevant Medications   busPIRone (BUSPAR) 10 MG tablet   Other Relevant Orders   AMB Referral to Chronic Care Management Services   COPD with acute exacerbation (HCC)       Relevant Medications   brompheniramine-pseudoephedrine-DM 30-2-10 MG/5ML syrup   predniSONE (DELTASONE) 20 MG tablet   levofloxacin (LEVAQUIN) 500 MG tablet   Other Relevant Orders   AMB Referral to Chronic Care Management Services   Visual impairment       Relevant Orders   AMB Referral to Chronic Care Management Services   Recurrent falls       Relevant Orders   AMB Referral to Chronic Care Management Services       Major Depression Severe Recurrent  Generalized Anxiety with Panic  Discussion today with patient and his caregiver  Concerning worsening mental health and has been to ED multiple times. Treated with similar meds for panic anxiety.  At home he does well with medication when given, but he does have frequent panic attacks and requesting additional medication. We discussed risk of polypharmacy and multiple mental health medications, and expressed our concern that we have tried getting him into geriatric psych for a period of time, without success. He has upcoming mental health psych eval from [facility] hopefully they can assist with his med management.  Will adjust meds now Taper down Amitriptyline from '10mg'$  nightly down to HALF tab '5mg'$  nightly for 2 weeks, then discontinue. Increase Buspar (Buspirone) '10mg'$  to now 3 times a  day instead of 2. Can be taken in afternoon to help avoid panic anxiety  Okay to take the Lorazepam Ativan ONLY IF NEEDED for more severe anxiety symptoms. Max is 2 times per day,. But not every day.  For Cough / COPD Flare exacerbation Start taking Levaquin antibiotic '500mg'$  daily x 7 days Start Prednisone taper 7 days Rx Cough Syrup rx ordered use for 7 days then discontinue.  He may be candidate for Home Hospice vs Palliative Evaluation. Will coordinate with CCM team first.   Orders Placed This Encounter  Procedures   AMB Referral to Chronic Care Management Services    Referral Priority:   Routine    Referral Type:   Consultation    Referral Reason:   Chronic Care Management (CCM)    Number of Visits Requested:   1     Meds ordered this encounter  Medications   busPIRone (BUSPAR) 10 MG tablet    Sig: Take 1 tablet (10 mg total) by mouth 3 (three) times daily as needed (anxiety).    Dispense:  90 tablet    Refill:  3   brompheniramine-pseudoephedrine-DM 30-2-10 MG/5ML syrup    Sig: Take 5 mLs by mouth 4 (four) times daily as needed.    Dispense:  118 mL    Refill:  0   predniSONE (DELTASONE) 20 MG tablet  Sig: Take daily with food. Start with '60mg'$  (3 pills) x 2 days, then reduce to '40mg'$  (2 pills) x 2 days, then '20mg'$  (1 pill) x 3 days    Dispense:  13 tablet    Refill:  0   levofloxacin (LEVAQUIN) 500 MG tablet    Sig: Take 1 tablet (500 mg total) by mouth daily. For 7 days    Dispense:  7 tablet    Refill:  0     Follow up plan: Return in about 6 weeks (around 08/15/2022) for 6 week follow-up mental health anxiety COPD.   [name], [facility] 07/04/2022, 1:33 PM

## 2022-07-04 NOTE — Telephone Encounter (Signed)
Copied from [place] [id number]. Topic: General - Inquiry >> Jul 04, 2022  8:56 AM Penni Bombard wrote: Reason for CRM: pt's caregiver [name] called saying she needs to speak to the nurse before his appt today.  He will not let her talk to the doctor when she goes in the room with him but she needs to tll the provider some things.  CB#  [phone]

## 2022-07-04 NOTE — Patient Instructions (Addendum)
Thank you for coming to the office today.  Taper down Amitriptyline from '10mg'$  nightly down to HALF tab '5mg'$  nightly for 2 weeks, then discontinue.  Increase Buspar (Buspirone) '10mg'$  to now 3 times a day instead of 2. Can be taken in afternoon to help avoid panic anxiety  Okay to take the Lorazepam Ativan ONLY IF NEEDED for more severe anxiety symptoms. Max is 2 times per day,. But not every day.  For Cough  Start taking Levaquin antibiotic '500mg'$  daily x 7 days  STart Prednisone taper 7 days  Cough Syrup rx ordered use for 7 days then discontinue.    Please schedule a Follow-up Appointment to: Return in about 6 weeks (around 08/15/2022) for 6 week follow-up mental health anxiety COPD.  If you have any other questions or concerns, please feel free to call the office or send a message through [place]. You may also schedule an earlier appointment if necessary.  Additionally, you may be receiving a survey about your experience at our office within a few days to 1 week by e-mail or mail. We value your feedback.  [name], DO [place]

## 2022-07-05 ENCOUNTER — Ambulatory Visit: Payer: Self-pay

## 2022-07-05 ENCOUNTER — Telehealth: Payer: Self-pay

## 2022-07-05 ENCOUNTER — Ambulatory Visit: Payer: Self-pay | Admitting: *Deleted

## 2022-07-05 NOTE — Telephone Encounter (Signed)
  Chief Complaint: Panic Attack Symptoms: Anxious after taking AM meds. HR"Fast' Frequency: Per caregiver "Many weeks, all the time, after takes AM meds." Pertinent Negatives: Patient denies  Disposition: '[]'$ ED /'[]'$ Urgent Care (no appt availability in office) / '[]'$ Appointment(In office/virtual)/ '[]'$  [place] [facility]/ '[]'$ Home Care/ '[]'$ Refused Recommended Disposition /'[]'$ [place] Mobile Bus/ '[x]'$  Follow-up with PCP Additional Notes: Caregiver 'Charlie' calling, pt present. Charlie states "Twenty minutes after taking meds he does this, been going on for weeks since Buspar was increased for 5 to 10 mg."  Pt seen OV yesterday. Questioned caregiver if pt took Ativan, noted in [place] for severe anxiety, states "Why would he take that, that's making him like this." Caregiver with angry affect throughout call. Spoke to pt. Pt states anxiety lasts "About an hour." States occurs after taking all AM meds.  Assured NT would route to practice for PCPs review, advised ED for worsening symptoms. Pt and caregiver verbalize understanding. Caregiver will be with pt.  Please advise. Reason for Disposition  Patient sounds very upset or troubled to the triager  Answer Assessment - Initial Assessment Questions 1. CONCERN: "Did anything happen that prompted you to call today?"      Taking meds 2. ANXIETY SYMPTOMS: "Can you describe how you (your loved one; patient) have been feeling?" (e.g., tense, restless, panicky, anxious, keyed up, overwhelmed, sense of impending doom).      Anxious 3. ONSET: "How long have you been feeling this way?" (e.g., hours, days, weeks)     Weeks, since Buspar increased 4. SEVERITY: "How would you rate the level of anxiety?" (e.g., 0 - 10; or mild, moderate, severe).     severe 5. FUNCTIONAL IMPAIRMENT: "How have these feelings affected your ability to do daily activities?" "Have you had more difficulty than usual doing your normal daily activities?" (e.g., getting better, same, worse;  self-care, school, work, interactions)      6. HISTORY: "Have you felt this way before?" "Have you ever been diagnosed with an anxiety problem in the past?" (e.g., generalized anxiety disorder, panic attacks, PTSD). If Yes, ask: "How was this problem treated?" (e.g., medicines, counseling, etc.)      7. RISK OF HARM - SUICIDAL IDEATION: "Do you ever have thoughts of hurting or killing yourself?" If Yes, ask:  "Do you have these feelings now?" "Do you have a plan on how you would do this?"      8. TREATMENT:  "What has been done so far to treat this anxiety?" (e.g., medicines, relaxation strategies). "What has helped?"      9. TREATMENT - THERAPIST: "Do you have a counselor or therapist? Name?"      10. POTENTIAL TRIGGERS: "Do you drink caffeinated beverages (e.g., coffee, colas, teas), and how much daily?" "Do you drink alcohol or use any drugs?" "Have you started any new medicines recently?"       *No Answer* 11. PATIENT SUPPORT: "Who is with you now?" "Who do you live with?" "Do you have family or friends who you can talk to?"        Charlie 12. OTHER SYMPTOMS: "Do you have any other symptoms?" (e.g., feeling depressed, trouble concentrating, trouble sleeping, trouble breathing, palpitations or fast heartbeat, chest pain, sweating, nausea, or diarrhea)       Heart rate fast  Protocols used: Anxiety and Panic Attack-A-AH

## 2022-07-05 NOTE — Telephone Encounter (Signed)
  Patient's caregiver, [name] called in asking if its ok to use her machine to give patient a breathing treatment     Chief Complaint: Cough.Girlfriend asking for pt. To use her nebulizer she has at home. Instructed pt. Should not use her nebulier. Symptoms: Cough Frequency:  Pertinent Negatives: Patient denies fever Disposition: '[]'$ ED /'[]'$ Urgent Care (no appt availability in office) / '[]'$ Appointment(In office/virtual)/ '[]'$  [facility]/ '[]'$ Home Care/ '[]'$ Refused Recommended Disposition /'[]'$  Mobile Bus/ '[x]'$  Follow-up with PCP Additional Notes: Please  advise.  Answer Assessment - Initial Assessment Questions 1. ONSET: "When did the cough begin?"      Weeks ago 2. SEVERITY: "How bad is the cough today?"      Severe 3. SPUTUM: "Describe the color of your sputum" (none, dry cough; clear, white, yellow, green)     Clear 4. HEMOPTYSIS: "Are you coughing up any blood?" If so ask: "How much?" (flecks, streaks, tablespoons, etc.)     No 5. DIFFICULTY BREATHING: "Are you having difficulty breathing?" If Yes, ask: "How bad is it?" (e.g., mild, moderate, severe)    - MILD: No SOB at rest, mild SOB with walking, speaks normally in sentences, can lie down, no retractions, pulse < 100.    - MODERATE: SOB at rest, SOB with minimal exertion and prefers to sit, cannot lie down flat, speaks in phrases, mild retractions, audible wheezing, pulse 100-120.    - SEVERE: Very SOB at rest, speaks in single words, struggling to breathe, sitting hunched forward, retractions, pulse > 120      Mild 6. FEVER: "Do you have a fever?" If Yes, ask: "What is your temperature, how was it measured, and when did it start?"     No 7. CARDIAC HISTORY: "Do you have any history of heart disease?" (e.g., heart attack, congestive heart failure)      COPD 8. LUNG HISTORY: "Do you have any history of lung disease?"  (e.g., pulmonary embolus, asthma, emphysema)   COPD 9. PE RISK FACTORS: "Do you have a history of  blood clots?" (or: recent major surgery, recent prolonged travel, bedridden)     nO 10. OTHER SYMPTOMS: "Do you have any other symptoms?" (e.g., runny nose, wheezing, chest pain)       NO 11. PREGNANCY: "Is there any chance you are pregnant?" "When was your last menstrual period?"       N/A 12. TRAVEL: "Have you traveled out of the country in the last month?" (e.g., travel history, exposures)       No  Protocols used: Cough - Chronic-A-AH

## 2022-07-05 NOTE — Progress Notes (Signed)
  Chronic Care Management   Note  07/05/2022 Name: Aaron Gibbs MRN: [id number] DOB: March 04, 1935  Aaron Gibbs is a 88 y.o. year old male who is a primary care patient of Olin Hauser, DO. I reached out to [name] by phone today in response to a referral sent by Aaron Gibbs PCP.  The first contact attempt was unsuccessful.   Follow up plan: Additional outreach attempts will be made.  Noreene Larsson, [place], [place] [address] Direct Dial: [phone] [email or web address]

## 2022-07-05 NOTE — Telephone Encounter (Signed)
It is okay to use her Nebulizer machine.  Do they need [place] to order a new machine for Bill? We would need to know which supply company his insurance will cover? [place], [facility] 07/05/2022, 3:21 PM

## 2022-07-05 NOTE — Telephone Encounter (Signed)
Attempted to call patient. At number on chart. Phone was picked up but no voice, could not hear. I attempted to contact them back no answer.  I reviewed the request. This is very similar to our discussion at his office visit yesterday 07/04/22.  I have already attempted to adjust his medication by adding the Buspar '10mg'$  now 3 times a day. Instead of 2 times a day to help his anxiety.  He may take the Lorazepam if needed for acute panic anxiety. But we strongly emphasized to limit taking this and prefer to avoid every day dosing.  I am not sure I have much else to offer and I did not quite understand exactly what they were asking in their question.  [name], [facility] 07/05/2022, 11:00 AM

## 2022-07-06 ENCOUNTER — Ambulatory Visit: Payer: Self-pay

## 2022-07-06 NOTE — Telephone Encounter (Signed)
  Chief Complaint: Medication reaction Symptoms: Extreme anxiety 1 hour after taking medication Frequency: Since stating medication Pertinent Negatives: Patient denies  Disposition: '[]'$ ED /'[]'$ Urgent Care (no appt availability in office) / '[]'$ Appointment(In office/virtual)/ '[]'$  [place] Virtual [facility]/ '[]'$ Home Care/ '[]'$ Refused Recommended Disposition /'[]'$  Mobile Bus/ '[x]'$  Follow-up with PCP Additional Notes: Spoke with Charlie. [name] has been giving pt Buspar 3 times daily everyday since Tuesday. About 1 hour after he takes this medication, he becomes very anxious. He demands his emergency anxiety medication. Per [name] he does calm down after a bit. [name] has been given the med 3 times daily. She did not see the part "as needed".    If pt becomes very agitated, [name] will call EMS or take pt to ED. Appt made for tomorrow.   Please call Charlie back with Advise.     Reason for Disposition  [1] Caller has URGENT medicine question about med that PCP or specialist prescribed AND [2] triager unable to answer question  Answer Assessment - Initial Assessment Questions 1. NAME of MEDICINE: "What medicine(s) are you calling about?"        busPIRone (BUSPAR) 10 MG tablet       2. QUESTION: "What is your question?" (e.g., double dose of medicine, side effect)     Pt is having side effects. 3. PRESCRIBER: "Who prescribed the medicine?" Reason: if prescribed by specialist, call should be referred to that group.     Dr. Parks Ranger 4. SYMPTOMS: "Do you have any symptoms?" If Yes, ask: "What symptoms are you having?"  "How bad are the symptoms (e.g., mild, moderate, severe)     Extreme anxiety 1 hour after taking this medication 5. PREGNANCY:  "Is there any chance that you are pregnant?" "When was your last menstrual period?"  Protocols used: Medication Question Call-A-AH

## 2022-07-07 ENCOUNTER — Ambulatory Visit: Payer: Medicare Other | Admitting: Family Medicine

## 2022-07-10 ENCOUNTER — Telehealth: Payer: Self-pay | Admitting: *Deleted

## 2022-07-10 ENCOUNTER — Ambulatory Visit: Payer: Medicare Other | Admitting: Family Medicine

## 2022-07-10 ENCOUNTER — Encounter: Payer: Self-pay | Admitting: Family Medicine

## 2022-07-10 ENCOUNTER — Ambulatory Visit: Payer: Self-pay | Admitting: *Deleted

## 2022-07-10 VITALS — BP 140/88 | HR 88 | Resp 18 | Ht 71.0 in | Wt 236.0 lb

## 2022-07-10 DIAGNOSIS — M159 Polyosteoarthritis, unspecified: Secondary | ICD-10-CM

## 2022-07-10 DIAGNOSIS — J441 Chronic obstructive pulmonary disease with (acute) exacerbation: Secondary | ICD-10-CM

## 2022-07-10 DIAGNOSIS — F410 Panic disorder [episodic paroxysmal anxiety] without agoraphobia: Secondary | ICD-10-CM

## 2022-07-10 DIAGNOSIS — J432 Centrilobular emphysema: Secondary | ICD-10-CM | POA: Diagnosis not present

## 2022-07-10 DIAGNOSIS — F411 Generalized anxiety disorder: Secondary | ICD-10-CM

## 2022-07-10 MED ORDER — PSEUDOEPH-BROMPHEN-DM 30-2-10 MG/5ML PO SYRP: 5.0000 mL | ORAL_SOLUTION | Freq: Four times a day (QID) | ORAL | 0 refills | Status: DC | PRN

## 2022-07-10 NOTE — Patient Instructions (Addendum)
Thank you for coming to the office today.  Mental Health Psychiatry 07/21/22 - discuss medications  Referral to Hospice for Home Eval - referral today  Care Management Team / Nurse Brent Worker will call on Thursday 1130am.  Buspar '10mg'$  - okay to take 3 times every day, technically it is an "as needed" medication, if there is a day that is good, it is okay to skip a dose. But if you need it every, it is fine to take one pill 3 times every day.  Ativan recommend 1 per day for anxiety. If worsening or severe max dose is twice per day if it is a bad day, but we cannot take it twice per day every day. Therefore, I would suggest to [facility] the next day if you took it twice the day before.  Finish Prednisone tomorrow. Then repeat  Renew cough syrup, new order sent.  Try the Duoneb nebulizer every 4- to 6 hours as needed for cough / breathing.  Finished levaquin, will pause on more antibiotics now. No repeat yet.  Please schedule a Follow-up Appointment to: Return if symptoms worsen or fail to improve.  If you have any other questions or concerns, please feel free to call the office or send a message through [place]. You may also schedule an earlier appointment if necessary.  Additionally, you may be receiving a survey about your experience at our office within a few days to 1 week by e-mail or mail. We value your feedback.  [name], DO [place]

## 2022-07-10 NOTE — Progress Notes (Signed)
  Care Coordination   Note   07/10/2022 Name: Aaron Gibbs MRN: [id number] DOB: 01/13/1935  Aaron Gibbs is a 88 y.o. year old male who sees Olin Hauser, DO for primary care. I reached out to [name] by phone today to offer care coordination services.  [name] was given information about Care Coordination services today including:   The Care Coordination services include support from the care team which includes your Nurse Coordinator, Clinical Social Worker, or Pharmacist.  The Care Coordination team is here to help remove barriers to the health concerns and goals most important to you. Care Coordination services are voluntary, and the patient may decline or stop services at any time by request to their care team member.   Care Coordination Consent Status: Patient agreed to services and verbal consent obtained.   Follow up plan:  Telephone appointment with care coordination team member scheduled for:  07/14/2022  Encounter Outcome:  Pt. Scheduled from referral   Julian Hy, [place] Direct Dial: [phone]

## 2022-07-10 NOTE — Telephone Encounter (Signed)
Reason for Disposition  [1] MILD difficulty breathing (e.g., minimal/no SOB at rest, SOB with walking, pulse <100) AND [2] NEW-onset or WORSE than normal    Seen last week.   No better is worse  wheezing shortness of breath  Answer Assessment - Initial Assessment Questions 1. RESPIRATORY STATUS: "Describe your breathing?" (e.g., wheezing, shortness of breath, unable to speak, severe coughing)      I'm not any better with my breathing.   Wheezing is worse.    2. ONSET: "When did this breathing problem begin?"      I saw Dr. Parks Ranger last week for this.     3. PATTERN "Does the difficult breathing come and go, or has it been constant since it started?"      It's gotten the worse. 4. SEVERITY: "How bad is your breathing?" (e.g., mild, moderate, severe)    - MILD: No SOB at rest, mild SOB with walking, speaks normally in sentences, can lie down, no retractions, pulse < 100.    - MODERATE: SOB at rest, SOB with minimal exertion and prefers to sit, cannot lie down flat, speaks in phrases, mild retractions, audible wheezing, pulse 100-120.    - SEVERE: Very SOB at rest, speaks in single words, struggling to breathe, sitting hunched forward, retractions, pulse > 120      It's worse 5. RECURRENT SYMPTOM: "Have you had difficulty breathing before?" If Yes, ask: "When was the last time?" and "What happened that time?"      Not asked since seen last week. 6. CARDIAC HISTORY: "Do you have any history of heart disease?" (e.g., heart attack, angina, bypass surgery, angioplasty)      Not asked 7. LUNG HISTORY: "Do you have any history of lung disease?"  (e.g., pulmonary embolus, asthma, emphysema)     No 8. CAUSE: "What do you think is causing the breathing problem?"      I'm not better from URI 9. OTHER SYMPTOMS: "Do you have any other symptoms? (e.g., dizziness, runny nose, cough, chest pain, fever)     Runny nose 10. O2 SATURATION MONITOR:  "Do you use an oxygen saturation monitor (pulse oximeter) at  home?" If Yes, ask: "What is your reading (oxygen level) today?" "What is your usual oxygen saturation reading?" (e.g., 95%)       N/A 11. PREGNANCY: "Is there any chance you are pregnant?" "When was your last menstrual period?"       N/A 12. TRAVEL: "Have you traveled out of the country in the last month?" (e.g., travel history, exposures)       N/A  Protocols used: Breathing Difficulty-A-AH  Chief Complaint: Shortness of breath worsening and wheezing Symptoms: Above Frequency: since being seen last week Pertinent Negatives: Patient denies fever Disposition: '[]'$ ED /'[]'$ Urgent Care (no appt availability in office) / '[x]'$ Appointment(In office/virtual)/ '[]'$  [facility]/ '[]'$ Home Care/ '[]'$ Refused Recommended Disposition /'[]'$  Mobile Bus/ '[]'$  Follow-up with PCP Additional Notes: Pt called in for a virtual appt.   However the caretaker called in wanting to talk with Dr. Parks Ranger in private.   Kept 2:20 appt for today

## 2022-07-10 NOTE — Progress Notes (Signed)
Subjective:    Patient ID: Aaron Gibbs, male    DOB: 18-Nov-1934, 88 y.o.   MRN: [id number]  Aaron Gibbs is a 88 y.o. male presenting on 07/10/2022 for Anxiety   HPI  Generalized Anxiety Disorder, Panic Attack Major Depression recurrent severe Chronic history of mental health problems as we have followed and discussed over period of few years.  ED visit 1/1 and 1/2 for anxiety and mood. He has daily panic attack anxiety worsening. Previously unsuccessful getting him into Psychiatry for Geriatric, [facility] and [facility] can send Psych soon. Will be on 07/21/22 Psych through [facility].   He admits panic attack. daily usually will occur 1 hour after meds sometimes. He has been on Buspar '10mg'$  THREE TIMES A DAY now. Instructions were AS NEEDED and they wanted to ask if can take regularly. Still taking Ativan but not daily, he requests it daily, taking max 1 per day usually.  OFF Amitriptyline Taking Trazodone nightly with relief for sleep.   Acute COPD Exacerbation / Cough Still with persistent coughing. Last visit treated with Levaquin, Prednisone taper, Cough Syrup He has had some relief but not resolved. Still coughing. Has chronic dyspnea. Needs re order cough syrup Denies any fever chills   Generalized Weakness History of Falls Using wheelchair, but can ambulate short distance if needed. Needing help with ADLs, has caregiver support at home but she has needed assistance.      07/04/2022    3:06 PM 06/06/2022    3:32 PM 05/02/2022    2:16 PM  Depression screen PHQ 2/9  Decreased Interest '2 1 2  '$ Down, Depressed, Hopeless '1 1 1  '$ PHQ - 2 Score '3 2 3  '$ Altered sleeping '1 3 3  '$ Tired, decreased energy '3 3 1  '$ Change in appetite '2 3 3  '$ Feeling bad or failure about yourself   0 0  Trouble concentrating 0 0 1  Moving slowly or fidgety/restless 0 0 0  Suicidal thoughts 0 0 0  PHQ-9 Score '9 11 11  '$ Difficult doing work/chores Somewhat difficult Not difficult at all  Somewhat difficult    Social History   Tobacco Use   Smoking status: Former    Years: 20.00    Types: Cigarettes   Smokeless tobacco: Current    Types: Chew   Tobacco comments:    stopped 15 years ago  Vaping Use   Vaping Use: Never used  Substance Use Topics   Alcohol use: Not Currently    Comment: past   Drug use: Never    Review of Systems Per HPI unless specifically indicated above     Objective:    BP (!) 140/88 (BP Location: Left Arm, Patient Position: Sitting, Cuff Size: Normal)   Pulse 88   Resp 18   Ht '5\' 11"'$  (1.803 m)   Wt 236 lb (107 kg)   SpO2 94%   BMI 32.92 kg/m   Wt Readings from Last 3 Encounters:  07/10/22 236 lb (107 kg)  06/19/22 240 lb 4.8 oz (109 kg)  06/16/22 240 lb 15.4 oz (109.3 kg)    Physical Exam Vitals and nursing note reviewed.  Constitutional:      General: He is not in acute distress.    Appearance: Normal appearance. He is well-developed. He is obese. He is not diaphoretic.     Comments: Chronically ill appearing elderly 88 yr male  HENT:     Head: Normocephalic and atraumatic.  Eyes:     General:  Right eye: No discharge.        Left eye: No discharge.     Conjunctiva/sclera: Conjunctivae normal.  Cardiovascular:     Rate and Rhythm: Normal rate.  Pulmonary:     Comments: Reduced air movement. Increased respiratory effort with anxiety but seems to calm with discussion. No wheezing Musculoskeletal:     Comments: Limited to wheelchair  Skin:    General: Skin is warm and dry.     Findings: No erythema or rash.  Neurological:     Mental Status: He is alert and oriented to person, place, and time.  Psychiatric:        Mood and Affect: Mood normal.        Behavior: Behavior normal.        Thought Content: Thought content normal.     Comments: Well groomed, good eye contact, normal speech and thoughts       Results for orders placed or performed during the hospital encounter of 06/19/22  Comprehensive metabolic  panel  Result Value Ref Range   Sodium 141 135 - 145 mmol/L   Potassium 3.9 3.5 - 5.1 mmol/L   Chloride 110 98 - 111 mmol/L   CO2 24 22 - 32 mmol/L   Glucose, Bld 137 (H) 70 - 99 mg/dL   BUN 19 8 - 23 mg/dL   Creatinine, Ser 1.17 0.61 - 1.24 mg/dL   Calcium 9.1 8.9 - 10.3 mg/dL   Total Protein 7.4 6.5 - 8.1 g/dL   Albumin 4.0 3.5 - 5.0 g/dL   AST 33 15 - 41 U/L   ALT 24 0 - 44 U/L   Alkaline Phosphatase 63 38 - 126 U/L   Total Bilirubin 0.8 0.3 - 1.2 mg/dL   GFR, Estimated >60 >60 mL/min   Anion gap 7 5 - 15  Ethanol  Result Value Ref Range   Alcohol, Ethyl (B) <81 <01 mg/dL  Salicylate level  Result Value Ref Range   Salicylate Lvl <7.5 (L) 7.0 - 30.0 mg/dL  Acetaminophen level  Result Value Ref Range   Acetaminophen (Tylenol), Serum <10 (L) 10 - 30 ug/mL  cbc  Result Value Ref Range   WBC 3.3 (L) 4.0 - 10.5 K/uL   RBC 4.45 4.22 - 5.81 MIL/uL   Hemoglobin 11.2 (L) 13.0 - 17.0 g/dL   HCT 38.2 (L) 39.0 - 52.0 %   MCV 85.8 80.0 - 100.0 fL   MCH 25.2 (L) 26.0 - 34.0 pg   MCHC 29.3 (L) 30.0 - 36.0 g/dL   RDW 21.4 (H) 11.5 - 15.5 %   Platelets 134 (L) 150 - 400 K/uL   nRBC 0.0 0.0 - 0.2 %  Urine Drug Screen, Qualitative  Result Value Ref Range   Tricyclic, Ur Screen POSITIVE (A) NONE DETECTED   Amphetamines, Ur Screen NONE DETECTED NONE DETECTED   MDMA (Ecstasy)Ur Screen NONE DETECTED NONE DETECTED   Cocaine Metabolite,Ur [place] NONE DETECTED NONE DETECTED   Opiate, Ur Screen NONE DETECTED NONE DETECTED   Phencyclidine (PCP) Ur S NONE DETECTED NONE DETECTED   Cannabinoid 50 Ng, Ur [place] NONE DETECTED NONE DETECTED   Barbiturates, Ur Screen NONE DETECTED NONE DETECTED   Benzodiazepine, Ur Scrn NONE DETECTED NONE DETECTED   Methadone Scn, Ur NONE DETECTED NONE DETECTED      Assessment & Plan:   Problem List Items Addressed This Visit     Osteoarthritis of multiple joints (Chronic)   Relevant Orders   Ambulatory referral to Hospice  COPD (chronic obstructive pulmonary  disease) (HCC)   Relevant Medications   brompheniramine-pseudoephedrine-DM 30-2-10 MG/5ML syrup   Other Relevant Orders   Ambulatory referral to Hospice   Other Visit Diagnoses     Generalized anxiety disorder with panic attacks    -  Primary   Relevant Orders   Ambulatory referral to Hospice   COPD with acute exacerbation ([place])       Relevant Medications   brompheniramine-pseudoephedrine-DM 30-2-10 MG/5ML syrup   Other Relevant Orders   Ambulatory referral to [facility] 07/21/22 - discuss medications  Referral to Hospice for Home Eval - referral today  Care Management Team / Nurse Pam & Social Worker will call on Thursday 1130am.  Buspar '10mg'$  - okay to take 3 times every day, technically it is an "as needed" medication, if there is a day that is good, it is okay to skip a dose. But if you need it every, it is fine to take one pill 3 times every day.  Ativan recommend 1 per day for anxiety. If worsening or severe max dose is twice per day if it is a bad day, but we cannot take it twice per day every day. Therefore, I would suggest to [facility] the next day if you took it twice the day before.  Finish Prednisone tomorrow. Then repeat  Renew cough syrup, new order sent.  Try the Duoneb nebulizer every 4- to 6 hours as needed for cough / breathing.  Finished levaquin, will pause on more antibiotics now. No repeat yet.   Orders Placed This Encounter  Procedures   Ambulatory referral to Hospice    Referral Priority:   Routine    Referral Type:   Consultation    Referral Reason:   Specialty Services Required    Requested Specialty:   Hospice Services    Number of Visits Requested:   1    Meds ordered this encounter  Medications   brompheniramine-pseudoephedrine-DM 30-2-10 MG/5ML syrup    Sig: Take 5 mLs by mouth 4 (four) times daily as needed.    Dispense:  118 mL    Refill:  0     Follow up plan: Return if symptoms worsen or fail to  improve.   [name], [facility] 07/10/2022, 2:40 PM

## 2022-07-10 NOTE — Telephone Encounter (Signed)
This encounter was created in error - please disregard.

## 2022-07-10 NOTE — Progress Notes (Signed)
  Chronic Care Management   Note  07/10/2022 Name: [name] MRN: [id number] DOB: Jan 24, 1935  Aaron Gibbs is a 88 y.o. year old male who is a primary care patient of Olin Hauser, DO. I reached out to [name] by phone today in response to a referral sent by Mr. Wanda Cellucci PCP.  Mr. Hardgrove was given information about Chronic Care Management services today including:  CCM service includes personalized support from designated clinical staff supervised by the physician, including individualized plan of care and coordination with other care providers 24/7 contact phone numbers for assistance for urgent and routine care needs. Service will only be billed when office clinical staff spend 20 minutes or more in a month to coordinate care. Only one practitioner may furnish and bill the service in a calendar month. The patient may stop CCM services at amy time (effective at the end of the month) by phone call to the office staff. The patient will be responsible for cost sharing (co-pay) or up to 20% of the service fee (after annual deductible is met)  Mr. Estel Scholze  agreedto scheduling an appointment with the CCM RN Case Manager   Follow up plan: Patient agreed to scheduled appointment with RN Case Manager on 07/13/2022(date/time).   Noreene Larsson, [place], [place] [address] Direct Dial: [phone] [email or web address]

## 2022-07-11 ENCOUNTER — Telehealth: Payer: Self-pay

## 2022-07-11 ENCOUNTER — Ambulatory Visit: Payer: Self-pay | Admitting: *Deleted

## 2022-07-11 NOTE — Telephone Encounter (Signed)
Caregiver calling with concerns Chief Complaint: anxiety- patient demanding medication twice daily Symptoms: Patient is aware he can ask for anxiety medication twice daily and is demanding twice daily- gets upset if not given immediately- verbally abusive and states he wants to die. Frequency: daily- twice daily Pertinent Negatives: Patient denies   Disposition: '[]'$ ED /'[]'$ Urgent Care (no appt availability in office) / '[]'$ Appointment(In office/virtual)/ '[]'$  [facility]/ '[]'$ Home Care/ '[]'$ Refused Recommended Disposition /'[]'$ [place] [facility]/ '[x]'$  Follow-up with PCP Additional Notes: Patient was in office yesterday- advised only to take Ativan as needed- but demanding twice daily and gets very upset if he does not get it- so he has been taking twice daily  Reason for Disposition  [1] Symptoms of anxiety or panic attack AND [2] is a chronic symptom (recurrent or ongoing AND present > 4 weeks)  Answer Assessment - Initial Assessment Questions 1. CONCERN: "Did anything happen that prompted you to call today?"      Patient anxiety after pills- care giver concerned patient is having recation 2. ANXIETY SYMPTOMS: "Can you describe how you (your loved one; patient) have been feeling?" (e.g., tense, restless, panicky, anxious, keyed up, overwhelmed, sense of impending doom).      Anxiety after medication- patient gates very agitated- and request another pill. 3. ONSET: "How long have you been feeling this way?" (e.g., hours, days, weeks)     Ongoing- months 4. SEVERITY: "How would you rate the level of anxiety?" (e.g., 0 - 10; or mild, moderate, severe).     Severe- screaming for the additional pill 5. FUNCTIONAL IMPAIRMENT: "How have these feelings affected your ability to do daily activities?" "Have you had more difficulty than usual doing your normal daily activities?" (e.g., getting better, same, worse; self-care, school, work, interactions)     Occurring every morning and at afternoon dose-  same thing 6. HISTORY: "Have you felt this way before?" "Have you ever been diagnosed with an anxiety problem in the past?" (e.g., generalized anxiety disorder, panic attacks, PTSD). If Yes, ask: "How was this problem treated?" (e.g., medicines, counseling, etc.)     Ongoing- anxiety 7. RISK OF HARM - SUICIDAL IDEATION: "Do you ever have thoughts of hurting or killing yourself?" If Yes, ask:  "Do you have these feelings now?" "Do you have a plan on how you would do this?"     Threatens self harm day- until he gets the pill 8. TREATMENT:  "What has been done so far to treat this anxiety?" (e.g., medicines, relaxation strategies). "What has helped?"     Ativan- just emergency pills 9. TREATMENT - THERAPIST: "Do you have a counselor or therapist? Name?"     No- supposed to start-2/2 10. POTENTIAL TRIGGERS: "Do you drink caffeinated beverages (e.g., coffee, colas, teas), and how much daily?" "Do you drink alcohol or use any drugs?" "Have you started any new medicines recently?"       no 11. PATIENT SUPPORT: "Who is with you now?" "Who do you live with?" "Do you have family or friends who you can talk to?"        Ys- care taker 52. OTHER SYMPTOMS: "Do you have any other symptoms?" (e.g., feeling depressed, trouble concentrating, trouble sleeping, trouble breathing, palpitations or fast heartbeat, chest pain, sweating, nausea, or diarrhea)       no  Protocols used: Anxiety and Panic Attack-A-AH

## 2022-07-11 NOTE — Telephone Encounter (Signed)
Aaron Gibbs has been notified.   She stated that they have an appt with him on Thursday, Jan 25 and will send [place] information if anything needs to be signed.

## 2022-07-11 NOTE — Telephone Encounter (Signed)
Copied from [place] [id number]. Topic: General - Other >> Jul 11, 2022  3:31 PM Cyndi Bender wrote: Reason for CRM: Morena with [facility] called to see if Dr. Raliegh Ip would be the attending physician and she also asked if he would complete an oral certification of terminal illness.  Cb# [id number]

## 2022-07-11 NOTE — Telephone Encounter (Signed)
Please notify:  This is the same topic we discussed in visit the other day.  My best advice is that he should take it at least once every day. He may take the 2nd dose if it is needed. It seems he is requesting or "needing it" twice every day. That may be the only feasible solution at this time to keep on a twice per day schedule to help manage his symptoms and leave it at that, he can not take any dose beyond the twice per day.  We have already initiated referral to Chronic Care Management and [facility] and also he has Mental health Psych on 07/21/22.  All of them will be able to help assist with admin of medications.  [name], [facility] 07/11/2022, 12:39 PM

## 2022-07-11 NOTE — Telephone Encounter (Signed)
Yes I can be attending physician for him.  Thank you. Please let me know if they need any other information or orders from me.  [name], [facility] 07/11/2022, 4:04 PM

## 2022-07-11 NOTE — Telephone Encounter (Signed)
Care giver calling with concerns about patient. Patient gets very agitated and screams and states he" wants to die"- if he is not given his "emergency" nerve pill- Ativan. Patient is aware he has permissions to take emergency pill and he demands it twice /day. Know what to do. Care giver is at her wits end with this- she wants to do- this medication is as needed for emergency only" and patient is demanding it every day. She states patient was given an aspirin in the place of the Ativan and patient had placebo effect- calmed down and did fine. Patient is becoming very verbally abusive about this medication and she is asking for assistance with this. Patient has therapist coming 2/2.

## 2022-07-13 ENCOUNTER — Ambulatory Visit (INDEPENDENT_AMBULATORY_CARE_PROVIDER_SITE_OTHER): Payer: Medicare Other

## 2022-07-13 ENCOUNTER — Telehealth: Payer: Medicare Other

## 2022-07-13 DIAGNOSIS — F410 Panic disorder [episodic paroxysmal anxiety] without agoraphobia: Secondary | ICD-10-CM

## 2022-07-13 DIAGNOSIS — J441 Chronic obstructive pulmonary disease with (acute) exacerbation: Secondary | ICD-10-CM

## 2022-07-13 DIAGNOSIS — F332 Major depressive disorder, recurrent severe without psychotic features: Secondary | ICD-10-CM

## 2022-07-13 DIAGNOSIS — J432 Centrilobular emphysema: Secondary | ICD-10-CM

## 2022-07-13 NOTE — Patient Instructions (Addendum)
Please call the care guide team at [phone] if you need to cancel or reschedule your appointment.   If you are experiencing a Mental Health or [place] or need someone to talk to, please call the Suicide and Crisis Lifeline: 988 call the [place] National Suicide Prevention Lifeline: 856 211 3720 or TTY: (934)225-5525 TTY 417-594-4462) to talk to a trained counselor call 1-800-273-TALK (toll free, 24 hour hotline)   Following is a copy of the CCM Program Consent:  CCM service includes personalized support from designated clinical staff supervised by the physician, including individualized plan of care and coordination with other care providers 24/7 contact phone numbers for assistance for urgent and routine care needs. Service will only be billed when office clinical staff spend 20 minutes or more in a month to coordinate care. Only one practitioner may furnish and bill the service in a calendar month. The patient may stop CCM services at amy time (effective at the end of the month) by phone call to the office staff. The patient will be responsible for cost sharing (co-pay) or up to 20% of the service fee (after annual deductible is met)  Following is a copy of your full provider care plan:   Goals Addressed             This Visit's Progress    CCM Expected Outcome:  Monitor, Self-Manage and Reduce Symptoms of: Anxiety/Depression       Current Barriers:  Knowledge Deficits related to resources available to help the patient with mental health needs.  Care Coordination needs related to ongoing support and education of managing panic attacks, anxiety, and depression  in a patient with anxiety and depression Chronic Disease Management support and education needs related to effective management of depression and anxiety  Planned Interventions: Evaluation of current treatment plan related to depression and anxiety and patient's adherence to plan as established by  provider Advised patient to to call the office for changes in mood, anxiety, depression, or mental health needs. Also advised the caregiver to keep appointments with the mental health provider on 07-21-2022 Provided education to patient re: mindfulness, tactics to help the patient when he is acting out or having extreme anxiety and information to be sent in mail for the caregiver to have as resources to help with mindfulness and other stress reduction activities Reviewed medications with patient and discussed compliance. The caregiver states she is being compliant with his medications. He will scream out for "emergency" anxiety medications often. She states at times he gets verbally abusive with her and threatens to call "Adult services" . Reflective listening and support given to the patients caregiver Collaborated with pcp and LCSW  regarding Hospice declining referral and the expressed needs of the caregiver for helping the patient with his mental health needs Provided patient with mindfulness and other anxiety and depression  educational materials related to decreasing anxiety and depression and panic attacks Reviewed scheduled/upcoming provider appointments including no upcoming appointments with the pcp but the caregiver knows to call for changes or needs.  Social Work referral for assistance with support for caregiver and recommendation on measures to help with depression and anxiety Discussed plans with patient for ongoing care management follow up and provided patient with direct contact information for care management team Advised patient to discuss changes in mental health and well being with provider Screening for signs and symptoms of depression related to chronic disease state  Assessed social determinant of health barriers  Symptom Management: Take medications as prescribed  Attend all scheduled provider appointments Call provider office for new concerns or questions  call the Suicide  and Crisis Lifeline: 988 call the [place] National Suicide Prevention Lifeline: 617-736-0953 or TTY: 364 447 9700 TTY [phone]) to talk to a trained counselor call 1-800-273-TALK (toll free, 24 hour hotline) if experiencing a Mental Health or Behavioral Health Crisis   Follow Up Plan: Telephone follow up appointment with care management team member scheduled for: 08-28-2022 at 25 am       CCM:  Maintain, Monitor and Self-Manage Symptoms of COPD       Current Barriers:  Knowledge Deficits related to progression of COPD and how to effectively manage Care Coordination needs related to caregiver resources for helping in the care of the patient in a patient with COPD Chronic Disease Management support and education needs related to effective management of COPD  Planned Interventions: Provided patient with basic written and verbal COPD education on self care/management/and exacerbation prevention. The patients caregiver states that the patient does well with his breathing unless he starts having a panic attack and he starts hyperventilating. Education on mindfulness exercises and will send information in the mail to help with ways to help when he gets upset. Advised patient to track and manage COPD triggers. Review of triggers and monitoring for changes in his environment that may increase his risk of COPD exacerbation.  Provided written and verbal instructions on pursed lip breathing and utilized returned demonstration as teach back Provided instruction about proper use of medications used for management of COPD including inhalers Advised patient to self assesses COPD action plan zone and make appointment with provider if in the yellow zone for 48 hours without improvement Advised patient to engage in light exercise as tolerated 3-5 days a week to aid in the the management of COPD Provided education about and advised patient to utilize infection prevention strategies to reduce risk of respiratory  infection. Discussed infection prevention and being cautious. The patient does not go out much only to appointments. His caregiver gets things he needs and cares for him in her home.  Discussed the importance of adequate rest and management of fatigue with COPD Screening for signs and symptoms of depression related to chronic disease state  Assessed social determinant of health barriers  Symptom Management: Take medications as prescribed   Attend all scheduled provider appointments Call provider office for new concerns or questions  call the Suicide and Crisis Lifeline: 988 call the [place] National Suicide Prevention Lifeline: (980)652-9298 or TTY: 6124874130 TTY (418)503-7475) to talk to a trained counselor call 1-800-273-TALK (toll free, 24 hour hotline) if experiencing a Mental Health or [place]  eliminate smoking in my home identify and remove indoor air pollutants limit outdoor activity during cold weather develop a rescue plan eliminate symptom triggers at home follow rescue plan if symptoms flare-up  Follow Up Plan: Telephone follow up appointment with care management team member scheduled for:08-28-2022 at 31 am          The patient verbalized understanding of instructions, educational materials, and care plan provided today and agreed to receive a mailed copy of patient instructions, educational materials, and care plan.   Telephone follow up appointment with care management team member scheduled for: 08-28-2022 at 1 am  [place], MSN, CCM RN Care Manager  Chronic Care Management Direct Number: [phone]     Supporting Someone With Anxiety Anxiety is a mental health condition that causes nervousness or worry, which interferes with daily activities and relationships.  Anxiety disorders include: Generalized anxiety disorder (GAD). Social anxiety disorder. Phobia-related disorders. Panic disorder. Anxiety affects the person who has it as well  as friends and family members. Friends and family can help by offering support and understanding. How does this condition affect the person I am caring for? Anxiety is the experience of excessive nervousness or worry that feels out of control. Anxiety causes distress and prevents someone from living a normal life. Anxiety may: Last for long periods and be unpredictable. Cause restlessness, fatigue, extreme emotions, or irritability. Make it hard to fall asleep or focus on a task. How is anxiety treated? Anxiety disorders are treated by specialists with one of the following: Talk therapy or counseling. This includes cognitive behavioral therapy (CBT), exposure therapy, eye movement desensitization and reprocessing (EMDR), biofeedback, and acceptance and commitment therapy. Medicine to treat anxiety and to help control certain emotions and behaviors. Mind-body programs. These programs encourage the person with anxiety to be involved in his or her treatment and feel empowered. Mind-body programs may include mindfulness-based stress reduction training, yoga, or tai chi. What actions can I take to help manage this condition? Talk about the condition Clear and compassionate communication is the key to supporting your friend or family member. Keep these things in mind: Ask questions and listen to the person's answers. Validate his or her experience. Give the person space if she or he does not feel like talking. Do not minimize the person's feelings or suggest that he or she should just get over the feelings. Give the person time and space to become calm. Stay by his or her side and communicate that you care. Never ignore what the person says about suicide. Talking about suicide will not make your loved one want to commit suicide. You or your loved one can reach out 24 hours a day to get free, private support (on the phone or a live online chat) from a suicide crisis helpline, such as the [facility] at 403-609-1474 or 988 in the [place]. If appropriate, go with the person to visits with a counselor or health care provider. If the person agrees, ask the health care provider for any advice he or she may have for you. Create a safe environment To help create a safe environment do these things: Discuss alcohol and drugs. Come up with rules that you both agree to. Discuss guns and other weapons. Agree on where they should be stored. Keep ammunition in a separate locked location. Make a crisis plan Make a written crisis plan. Include important phone numbers, such as the local crisis intervention team. Make sure that: The person with anxiety knows about this plan and agrees with it. Everyone who has regular contact with the person knows about the plan and knows what to do in an emergency. The written plan is easily accessible and can be quickly put into action. Support the person in other ways  Make an effort to learn all you can about your loved one's form of anxiety. Respond with compassion and understanding when he or she is feeling anxious. Offer practical help with tasks, or help breaking big tasks up into small steps that feel more manageable. Include your loved one in activities. Invite him or her to go for walks and outings. Help your loved one follow his or her treatment plan as directed by his or her health care provider. This could mean driving him or her to therapy sessions or suggesting ways to manage stress. Remember that  your support really matters. Social support is a huge benefit for someone who is living with anxiety. How to manage stress Supporting someone with anxiety can be demanding. Make sure to take care of yourself. To do this: Maintain your normal routine. Respect your own limits. Say "no" to requests that do not work for you. Make time for activities that help you relax, such as spending time with friends. Practice deep breathing exercises to  lower your stress. Attend some mind-body classes by yourself or with your loved one. Get plenty of sleep. Exercise several times a week. If you are struggling emotionally with guilt, fear, or anger, consider visiting a therapist. Follow these instructions at home: Be aware of the signs that your loved one's condition may be getting worse. These signs include: Dramatic mood swings, such as irritability, tearfulness, or lacking any emotion. Avoiding activities that he or she used to enjoy, or isolating herself or himself. Drinking more alcohol than normal. Where to find support For both you and the person you are caring for: Consider joining self-help and support groups. They can help you feel a sense of hope and connect you with local resources to help you learn more. Consider family therapy to help you stay connected. Where to find more information A health care provider may make recommendations. They include: Substance Abuse and Mental Health Services Administration [facility]): ktimeonline.com National Alliance on Mental Illness (NAMI): www.nami.[employer] for Mental Wellbeing: www.mentalhealthfirstaid.org Get help right away if: The person you are caring for has behavior that becomes hard to predict (erratic). The person you are caring for shows behaviors such as seeing, feeling, tasting, or hearing things that are not real (hallucinations). The person you are caring for expresses thoughts about harming himself or herself or others. If you ever feel like your loved one may hurt himself or herself or others, or if he or she shares thoughts about taking his or her own life, get help right away. You can go to your nearest emergency department or: Call your local emergency services (911 in the [place] Call a suicide crisis helpline, such as the [place] at 669 123 1054 or 988 in the [place]. This is open 24 hours a day in the [place] Text the Crisis Text Line at  (513) 051-6031 (in the [place].). Summary People with anxiety disorders experience overwhelming feelings of nervousness or worry. Friends and family can help by offering support and understanding. Anxiety disorders are treated by mental health specialists. Various forms of talk therapy such as CBT, EMDR, and exposure therapy combined with mind-body programs can be helpful. Be compassionate and listen to your loved one. Be available if your loved one wants to talk, but give your loved one space if he or she does not feel like talking. Find ways to care for your own body, mind, and well-being while supporting someone with anxiety. Try to maintain your normal routines and make time to do things that you enjoy. This information is not intended to replace advice given to you by your health care provider. Make sure you discuss any questions you have with your health care provider. Document Revised: 12/29/2020 Document Reviewed: 09/26/2020 Elsevier Patient Education  [place]. Panic Attack A panic attack is when you suddenly feel very afraid, uncomfortable, or nervous (anxious). A panic attack can happen when you are scared, or it may happen for no reason. A panic attack can feel like a heart attack or stroke. See your doctor when you have a panic  attack to make sure you are not having a heart attack or stroke. What are the causes? Experiencing things that threaten your life, such as a war. Feeling worried or nervous for a long time (anxiety disorder). Being sad (depressed). Panic disorder. Certain medical conditions. Other causes may include: Certain medicines. Taking certain supplements. Illegal drugs. What increases the risk? Having another mental health condition. Using alcohol or drugs. Being under a lot of stress. Having events in your life that cause worry and sadness. What are the signs or symptoms? A panic attack: Starts suddenly. May last 5-10 minutes. Symptoms include one or more of  these: A pounding heart. A feeling that your heart is beating in an unusual way or faster than normal (palpitations). Sweating or shaking. Feeling short of breath. Chest pain. Feeling like you may vomit (nauseous). Feeling dizzy or like you might faint. Other symptoms may include: Chills or hot flashes. Numbness or tingling in your lips, hands, or feet. Feeling confused. Fear of losing control. Fear of dying. How is this treated? A panic attack is a symptom of another condition. Treatment depends on the cause of the panic attack. If the cause is a medical problem, your doctor will treat that problem or refer you to a specialist. If the cause is emotional, you may be given medicines or referred to a counselor. If the cause is a medicine, your doctor may tell you to stop the medicine, change your dose, or take a different medicine. If the cause is an illegal drug, treatment may involve letting the drug wear off and taking medicine to help the drug leave your body or to stop its effects. Attacks caused by heavy drug use may continue even if you stop using the drug. Most panic attacks go away after the cause is treated. Follow these instructions at home: Alcohol use Do not drink alcohol if: Your doctor tells you not to drink. You are pregnant, may be pregnant, or are planning to become pregnant. If you drink alcohol: Limit how much you have to: 0-1 drink a day for women. 0-2 drinks a day for men. Know how much alcohol is in your drink. In the [place] one drink equals one 12 oz bottle of beer (355 mL), one 5 oz glass of wine (148 mL), or one 1 oz glass of hard liquor (44 mL). General instructions  Take over-the-counter and prescription medicines only as told by your doctor. If you feel worried or nervous, try not to have caffeine. Take good care of your health. To do this: Eat healthy. Make sure to eat fresh fruits and vegetables, whole grains, lean meats, and low-fat dairy. Get  enough sleep. Try to sleep for 7-8 hours each night. Exercise. Try to be active for 30 minutes 5 or more days a week. Do not smoke or use any products that contain nicotine or tobacco. If you need help quitting, ask your doctor. Keep all follow-up visits. Where to find more information Substance Abuse and [facility]): SamedayNews.com.[facility]): https://carter.com/ Contact a doctor if: Your symptoms do not get better. Your symptoms get worse. You are not able to take your medicines as told. Get help right away if: You have thoughts of hurting yourself or others. Get help right away if you feel like you may hurt yourself or others, or have thoughts about taking your own life. Go to your nearest emergency room or: Call 911. Call the [place] at [phone]  or 988. This is open 24 hours a day. Text the Crisis Text Line at 2104990986. Summary A panic attack is when you suddenly feel very afraid, uncomfortable, or nervous (anxious). See your doctor when you have a panic attack to make sure that you do not have another serious problem. If you feel like you may hurt yourself or others, get help right away. Call 911. This information is not intended to replace advice given to you by your health care provider. Make sure you discuss any questions you have with your health care provider. Document Revised: 01/13/2021 Document Reviewed: 01/13/2021 Elsevier Patient Education  [place], Adult After being diagnosed with anxiety, you may be relieved to know why you have felt or behaved a certain way. You may also feel overwhelmed about the treatment ahead and what it will mean for your life. With care and support, you can manage this condition. How to manage lifestyle changes Managing stress and anxiety  Stress is your body's reaction to life changes and events, both good and bad. Most stress  will last just a few hours, but stress can be ongoing and can lead to more than just stress. Although stress can play a major role in anxiety, it is not the same as anxiety. Stress is usually caused by something external, such as a deadline, test, or competition. Stress normally passes after the triggering event has ended.  Anxiety is caused by something internal, such as imagining a terrible outcome or worrying that something will go wrong that will devastate you. Anxiety often does not go away even after the triggering event is over, and it can become long-term (chronic) worry. It is important to understand the differences between stress and anxiety and to manage your stress effectively so that it does not lead to an anxious response. Talk with your health care provider or a counselor to learn more about reducing anxiety and stress. He or she may suggest tension reduction techniques, such as: Music therapy. Spend time creating or listening to music that you enjoy and that inspires you. Mindfulness-based meditation. Practice being aware of your normal breaths while not trying to control your breathing. It can be done while sitting or walking. Centering prayer. This involves focusing on a word, phrase, or sacred image that means something to you and brings you peace. Deep breathing. To do this, expand your stomach and inhale slowly through your nose. Hold your breath for 3-5 seconds. Then exhale slowly, letting your stomach muscles relax. Self-talk. Learn to notice and identify thought patterns that lead to anxiety reactions and change those patterns to thoughts that feel peaceful. Muscle relaxation. Taking time to tense muscles and then relax them. Choose a tension reduction technique that fits your lifestyle and personality. These techniques take time and practice. Set aside 5-15 minutes a day to do them. Therapists can offer counseling and training in these techniques. The training to help with anxiety  may be covered by some insurance plans. Other things you can do to manage stress and anxiety include: Keeping a stress diary. This can help you learn what triggers your reaction and then learn ways to manage your response. Thinking about how you react to certain situations. You may not be able to control everything, but you can control your response. Making time for activities that help you relax and not feeling guilty about spending your time in this way. Doing visual imagery. This involves imagining or creating mental pictures to help you relax. Practicing  yoga. Through yoga poses, you can lower tension and promote relaxation.  Medicines Medicines can help ease symptoms. Medicines for anxiety include: Antidepressant medicines. These are usually prescribed for long-term daily control. Anti-anxiety medicines. These may be added in severe cases, especially when panic attacks occur. Medicines will be prescribed by a health care provider. When used together, medicines, psychotherapy, and tension reduction techniques may be the most effective treatment. Relationships Relationships can play a big part in helping you recover. Try to spend more time connecting with trusted friends and family members. Consider going to couples counseling if you have a partner, taking family education classes, or going to family therapy. Therapy can help you and others better understand your condition. How to recognize changes in your anxiety Everyone responds differently to treatment for anxiety. Recovery from anxiety happens when symptoms decrease and stop interfering with your daily activities at home or work. This may mean that you will start to: Have better concentration and focus. Worry will interfere less in your daily thinking. Sleep better. Be less irritable. Have more energy. Have improved memory. It is also important to recognize when your condition is getting worse. Contact your health care provider if  your symptoms interfere with home or work and you feel like your condition is not improving. Follow these instructions at home: Activity Exercise. Adults should do the following: Exercise for at least 150 minutes each week. The exercise should increase your heart rate and make you sweat (moderate-intensity exercise). Strengthening exercises at least twice a week. Get the right amount and quality of sleep. Most adults need 7-9 hours of sleep each night. Lifestyle  Eat a healthy diet that includes plenty of vegetables, fruits, whole grains, low-fat dairy products, and lean protein. Do not eat a lot of foods that are high in fats, added sugars, or salt (sodium). Make choices that simplify your life. Do not use any products that contain nicotine or tobacco. These products include cigarettes, chewing tobacco, and vaping devices, such as e-cigarettes. If you need help quitting, ask your health care provider. Avoid caffeine, alcohol, and certain over-the-counter cold medicines. These may make you feel worse. Ask your pharmacist which medicines to avoid. General instructions Take over-the-counter and prescription medicines only as told by your health care provider. Keep all follow-up visits. This is important. Where to find support You can get help and support from these sources: Self-help groups. Online and [employer]. A trusted spiritual leader. Couples counseling. Family education classes. Family therapy. Where to find more information You may find that joining a support group helps you deal with your anxiety. The following sources can help you locate counselors or support groups near you: [place]: www.mentalhealthamerica.net Anxiety and Depression Association of [place] (ADAA): https://www.clark.net/ National Alliance on Mental Illness (NAMI): www.nami.org Contact a health care provider if: You have a hard time staying focused or finishing daily tasks. You spend many  hours a day feeling worried about everyday life. You become exhausted by worry. You start to have headaches or frequently feel tense. You develop chronic nausea or diarrhea. Get help right away if: You have a racing heart and shortness of breath. You have thoughts of hurting yourself or others. If you ever feel like you may hurt yourself or others, or have thoughts about taking your own life, get help right away. Go to your nearest emergency department or: Call your local emergency services (911 in the [place] Call a suicide crisis helpline, such as the [place] at  (513)779-8908 or 988 in the [place] This is open 24 hours a day in the [place] Text the Crisis Text Line at 403-448-8345 (in the [place].). Summary Taking steps to learn and use tension reduction techniques can help calm you and help prevent triggering an anxiety reaction. When used together, medicines, psychotherapy, and tension reduction techniques may be the most effective treatment. Family, friends, and partners can play a big part in supporting you. This information is not intended to replace advice given to you by your health care provider. Make sure you discuss any questions you have with your health care provider. Document Revised: 12/29/2020 Document Reviewed: 09/26/2020 Elsevier Patient Education  [place]. Generalized Anxiety Disorder, Adult Generalized anxiety disorder (GAD) is a mental health condition. Unlike normal worries, anxiety related to GAD is not triggered by a specific event. These worries do not fade or get better with time. GAD interferes with relationships, work, and school. GAD symptoms can vary from mild to severe. People with severe GAD can have intense waves of anxiety with physical symptoms that are similar to panic attacks. What are the causes? The exact cause of GAD is not known, but the following are believed to have an impact: Differences in natural brain chemicals. Genes  passed down from parents to children. Differences in the way threats are perceived. Development and stress during childhood. Personality. What increases the risk? The following factors may make you more likely to develop this condition: Being male. Having a family history of anxiety disorders. Being very shy. Experiencing very stressful life events, such as the death of a loved one. Having a very stressful family environment. What are the signs or symptoms? People with GAD often worry excessively about many things in their lives, such as their health and family. Symptoms may also include: Mental and emotional symptoms: Worrying excessively about natural disasters. Fear of being late. Difficulty concentrating. Fears that others are judging your performance. Physical symptoms: Fatigue. Headaches, muscle tension, muscle twitches, trembling, or feeling shaky. Feeling like your heart is pounding or beating very fast. Feeling out of breath or like you cannot take a deep breath. Having trouble falling asleep or staying asleep, or experiencing restlessness. Sweating. Nausea, diarrhea, or irritable bowel syndrome (IBS). Behavioral symptoms: Experiencing erratic moods or irritability. Avoidance of new situations. Avoidance of people. Extreme difficulty making decisions. How is this diagnosed? This condition is diagnosed based on your symptoms and medical history. You will also have a physical exam. Your health care provider may perform tests to rule out other possible causes of your symptoms. To be diagnosed with GAD, a person must have anxiety that: Is out of his or her control. Affects several different aspects of his or her life, such as work and relationships. Causes distress that makes him or her unable to take part in normal activities. Includes at least three symptoms of GAD, such as restlessness, fatigue, trouble concentrating, irritability, muscle tension, or sleep  problems. Before your health care provider can confirm a diagnosis of GAD, these symptoms must be present more days than they are not, and they must last for 6 months or longer. How is this treated? This condition may be treated with: Medicine. Antidepressant medicine is usually prescribed for long-term daily control. Anti-anxiety medicines may be added in severe cases, especially when panic attacks occur. Talk therapy (psychotherapy). Certain types of talk therapy can be helpful in treating GAD by providing support, education, and guidance. Options include: Cognitive behavioral therapy (CBT). People learn coping skills and  self-calming techniques to ease their physical symptoms. They learn to identify unrealistic thoughts and behaviors and to replace them with more appropriate thoughts and behaviors. Acceptance and commitment therapy (ACT). This treatment teaches people how to be mindful as a way to cope with unwanted thoughts and feelings. Biofeedback. This process trains you to manage your body's response (physiological response) through breathing techniques and relaxation methods. You will work with a therapist while machines are used to monitor your physical symptoms. Stress management techniques. These include yoga, meditation, and exercise. A mental health specialist can help determine which treatment is best for you. Some people see improvement with one type of therapy. However, other people require a combination of therapies. Follow these instructions at home: Lifestyle Maintain a consistent routine and schedule. Anticipate stressful situations. Create a plan and allow extra time to work with your plan. Practice stress management or self-calming techniques that you have learned from your therapist or your health care provider. Exercise regularly and spend time outdoors. Eat a healthy diet that includes plenty of vegetables, fruits, whole grains, low-fat dairy products, and lean protein. Do  not eat a lot of foods that are high in fat, added sugar, or salt (sodium). Drink plenty of water. Avoid alcohol. Alcohol can increase anxiety. Avoid caffeine and certain over-the-counter cold medicines. These may make you feel worse. Ask your pharmacist which medicines to avoid. General instructions Take over-the-counter and prescription medicines only as told by your health care provider. Understand that you are likely to have setbacks. Accept this and be kind to yourself as you persist to take better care of yourself. Anticipate stressful situations. Create a plan and allow extra time to work with your plan. Recognize and accept your accomplishments, even if you judge them as small. Spend time with people who care about you. Keep all follow-up visits. This is important. Where to find more information [place]: https://carter.com/ Substance Abuse and Mental Health Services: ktimeonline.com Contact a health care provider if: Your symptoms do not get better. Your symptoms get worse. You have signs of depression, such as: A persistently sad or irritable mood. Loss of enjoyment in activities that used to bring you joy. Change in weight or eating. Changes in sleeping habits. Get help right away if: You have thoughts about hurting yourself or others. If you ever feel like you may hurt yourself or others, or have thoughts about taking your own life, get help right away. Go to your nearest emergency department or: Call your local emergency services (911 in the [place] Call a suicide crisis helpline, such as the [place] at 206-384-7440 or 988 in the [place]. This is open 24 hours a day in the [place] Text the Crisis Text Line at 504-751-3697 (in the [place].). Summary Generalized anxiety disorder (GAD) is a mental health condition that involves worry that is not triggered by a specific event. People with GAD often worry excessively about many things in their  lives, such as their health and family. GAD may cause symptoms such as restlessness, trouble concentrating, sleep problems, frequent sweating, nausea, diarrhea, headaches, and trembling or muscle twitching. A mental health specialist can help determine which treatment is best for you. Some people see improvement with one type of therapy. However, other people require a combination of therapies. This information is not intended to replace advice given to you by your health care provider. Make sure you discuss any questions you have with your health care provider. Document Revised: 12/29/2020 Document  Reviewed: 09/26/2020 Elsevier Patient Education  [place] Stress Reduction Mindfulness-based stress reduction (MBSR) is a program that helps people learn to practice mindfulness. Mindfulness is the practice of consciously paying attention to the present moment. MBSR focuses on developing self-awareness, which lets you respond to life stress without judgment or negative feelings. It can be learned and practiced through techniques such as education, breathing exercises, meditation, and yoga. MBSR includes several mindfulness techniques in one program. MBSR works best when you understand the treatment, are willing to try new things, and can commit to spending time practicing what you learn. MBSR training may include learning about: How your feelings, thoughts, and reactions affect your body. New ways to respond to things that cause negative thoughts to start (triggers). How to notice your thoughts and let go of them. Practicing awareness of everyday things that you normally do without thinking. The techniques and goals of different types of meditation. What are the benefits of MBSR? MBSR can have many benefits, which include helping you to: Develop self-awareness. This means knowing and understanding yourself. Learn skills and attitudes that help you to take part in your own  health care. Learn new ways to care for yourself. Be more accepting about how things are, and let things go. Be less judgmental and approach things with an open mind. Be patient with yourself and trust yourself more. MBSR has also been shown to: Reduce negative emotions, such as sadness, overwhelm, and worry. Improve memory and focus. Change how you sense and react to pain. Boost your body's ability to fight infections. Help you connect better with other people. Improve your sense of well-being. How to practice mindfulness To do a basic awareness exercise: Find a comfortable place to sit. Pay attention to the present moment. Notice your thoughts, feelings, and surroundings just as they are. Avoid judging yourself, your feelings, or your surroundings. Make note of any judgment that comes up and let it go. Your mind may wander, and that is okay. Make note of when your thoughts drift, and return your attention to the present moment. To do basic mindfulness meditation: Find a comfortable place to sit. This may include a stable chair or a firm floor cushion. Sit upright with your back straight. Let your arms fall next to your sides, with your hands resting on your legs. If you are sitting in a chair, rest your feet flat on the floor. If you are sitting on a cushion, cross your legs in front of you. Keep your head in a neutral position with your chin dropped slightly. Relax your jaw and rest the tip of your tongue on the roof of your mouth. Drop your gaze to the floor or close your eyes. Breathe normally and pay attention to your breath. Feel the air moving in and out of your nose. Feel your belly expanding and relaxing with each breath. Your mind may wander, and that is okay. Make note of when your thoughts drift, and return your attention to your breath. Avoid judging yourself, your feelings, or your surroundings. Make note of any judgment or feelings that come up, let them go, and bring your  Follow these instructions at home:  Find a local in-person or online MBSR program. Set aside some time regularly for mindfulness practice. Practice every day if you can. Even 10 minutes of practice is helpful. Find a mindfulness practice that works best for you. This may include one or more of the following: Meditation. This involves focusing your mind  on a certain thought or activity. Breathing awareness exercises. These help you to stay present by focusing on your breath. Body scan. For this practice, you lie down and pay attention to each part of your body from head to toe. You can identify tension and soreness and consciously relax parts of your body. Yoga. Yoga involves stretching and breathing, and it can improve your ability to move and be flexible. It can also help you to test your body's limits, which can help you release stress. Mindful eating. This way of eating involves focusing on the taste, texture, color, and smell of each bite of food. This slows down eating and helps you feel full sooner. For this reason, it can be an important part of a weight loss plan. Find a podcast or recording that provides guidance for breathing awareness, body scan, or meditation exercises. You can listen to these any time when you have a free moment to rest without distractions. Follow your treatment plan as told by your health care provider. This may include taking regular medicines and making changes to your diet or lifestyle as recommended. Where to find more information You can find more information about MBSR from: Your health care provider. Community-based meditation centers or programs. Programs offered near you. Summary Mindfulness-based stress reduction (MBSR) is a program that teaches you how to consciously pay attention to the present moment. It is used to help you deal better with daily stress, feelings, and pain. MBSR focuses on developing self-awareness, which allows you to respond to life  stress without judgment or negative feelings. MBSR programs may involve learning different mindfulness practices, such as breathing exercises, meditation, yoga, body scan, or mindful eating. Find a mindfulness practice that works best for you, and set aside time for it on a regular basis. This information is not intended to replace advice given to you by your health care provider. Make sure you discuss any questions you have with your health care provider. Document Revised: 01/13/2021 Document Reviewed: 01/13/2021 Elsevier Patient Education  [place].

## 2022-07-13 NOTE — Chronic Care Management (AMB) (Signed)
Chronic Care Management   CCM RN Visit Note  07/13/2022 Name: Aaron Gibbs MRN: [id number] DOB: 03/12/1935  Subjective: Aaron Gibbs is a 88 y.o. year old male who is a primary care patient of Olin Hauser, DO. The patient was referred to the Chronic Care Management team for assistance with care management needs subsequent to provider initiation of CCM services and plan of care.    Today's Visit:   Spoke to [facility] the patients caregiver and DRP  for initial visit.     SDOH Interventions Today    Flowsheet Row Most Recent Value  SDOH Interventions   Food Insecurity Interventions Intervention Not Indicated  Housing Interventions Intervention Not Indicated  Transportation Interventions Intervention Not Indicated  Utilities Interventions Intervention Not Indicated  Alcohol Usage Interventions Intervention Not Indicated (Score <7)  Financial Strain Interventions Intervention Not Indicated  Physical Activity Interventions Other (Comments)  [limited mobility, legally blind, the patient does not do structured activity]  Stress Interventions Other (Comment)  [LCSW referral in place, has a psyche appointment 07-21-2022]  Social Connections Interventions Other (Comment)  [good support from his caregiver, sees his daughter [name] one time a week]         Goals Addressed             This Visit's Progress    CCM Expected Outcome:  Monitor, Self-Manage and Reduce Symptoms of: Anxiety/Depression       Current Barriers:  Knowledge Deficits related to resources available to help the patient with mental health needs.  Care Coordination needs related to ongoing support and education of managing panic attacks, anxiety, and depression  in a patient with anxiety and depression Chronic Disease Management support and education needs related to effective management of depression and anxiety  Planned Interventions: Evaluation of current treatment plan related to depression and  anxiety and patient's adherence to plan as established by provider Advised patient to to call the office for changes in mood, anxiety, depression, or mental health needs. Also advised the caregiver to keep appointments with the mental health provider on 07-21-2022 Provided education to patient re: mindfulness, tactics to help the patient when he is acting out or having extreme anxiety and information to be sent in mail for the caregiver to have as resources to help with mindfulness and other stress reduction activities Reviewed medications with patient and discussed compliance. The caregiver states she is being compliant with his medications. He will scream out for "emergency" anxiety medications often. She states at times he gets verbally abusive with her and threatens to call "Adult services" . Reflective listening and support given to the patients caregiver Collaborated with pcp and LCSW  regarding Hospice declining referral and the expressed needs of the caregiver for helping the patient with his mental health needs Provided patient with mindfulness and other anxiety and depression  educational materials related to decreasing anxiety and depression and panic attacks Reviewed scheduled/upcoming provider appointments including no upcoming appointments with the pcp but the caregiver knows to call for changes or needs.  Social Work referral for assistance with support for caregiver and recommendation on measures to help with depression and anxiety Discussed plans with patient for ongoing care management follow up and provided patient with direct contact information for care management team Advised patient to discuss changes in mental health and well being with provider Screening for signs and symptoms of depression related to chronic disease state  Assessed social determinant of health barriers  Symptom Management: Take medications as prescribed  Attend all scheduled provider appointments Call  provider office for new concerns or questions  call the Suicide and Crisis Lifeline: 988 call the [place] National Suicide Prevention Lifeline: 510-600-1560 or TTY: 445-402-2135 TTY 364-698-7537) to talk to a trained counselor call 1-800-273-TALK (toll free, 24 hour hotline) if experiencing a Mental Health or Behavioral Health Crisis   Follow Up Plan: Telephone follow up appointment with care management team member scheduled for: 08-28-2022 at 25 am       CCM:  Maintain, Monitor and Self-Manage Symptoms of COPD       Current Barriers:  Knowledge Deficits related to progression of COPD and how to effectively manage Care Coordination needs related to caregiver resources for helping in the care of the patient in a patient with COPD Chronic Disease Management support and education needs related to effective management of COPD  Planned Interventions: Provided patient with basic written and verbal COPD education on self care/management/and exacerbation prevention. The patients caregiver states that the patient does well with his breathing unless he starts having a panic attack and he starts hyperventilating. Education on mindfulness exercises and will send information in the mail to help with ways to help when he gets upset. Advised patient to track and manage COPD triggers. Review of triggers and monitoring for changes in his environment that may increase his risk of COPD exacerbation.  Provided written and verbal instructions on pursed lip breathing and utilized returned demonstration as teach back Provided instruction about proper use of medications used for management of COPD including inhalers Advised patient to self assesses COPD action plan zone and make appointment with provider if in the yellow zone for 48 hours without improvement Advised patient to engage in light exercise as tolerated 3-5 days a week to aid in the the management of COPD Provided education about and advised patient to  utilize infection prevention strategies to reduce risk of respiratory infection. Discussed infection prevention and being cautious. The patient does not go out much only to appointments. His caregiver gets things he needs and cares for him in her home.  Discussed the importance of adequate rest and management of fatigue with COPD Screening for signs and symptoms of depression related to chronic disease state  Assessed social determinant of health barriers  Symptom Management: Take medications as prescribed   Attend all scheduled provider appointments Call provider office for new concerns or questions  call the Suicide and Crisis Lifeline: 988 call the [place] National Suicide Prevention Lifeline: 914-868-6099 or TTY: 979-604-2352 TTY 5072736634) to talk to a trained counselor call 1-800-273-TALK (toll free, 24 hour hotline) if experiencing a Mental Health or [place]  eliminate smoking in my home identify and remove indoor air pollutants limit outdoor activity during cold weather develop a rescue plan eliminate symptom triggers at home follow rescue plan if symptoms flare-up  Follow Up Plan: Telephone follow up appointment with care management team member scheduled for:08-28-2022 at 23 am          Plan:Telephone follow up appointment with care management team member scheduled for:  08-28-2022 at 35 am  Noreene Larsson RN, MSN, CCM RN Care Manager  Chronic Care Management Direct Number: [phone]

## 2022-07-13 NOTE — Plan of Care (Signed)
Chronic Care Management Provider Comprehensive Care Plan    07/13/2022 Name: Aaron Gibbs MRN: [id number] DOB: [birth year]  Referral to Chronic Care Management (CCM) services was placed by Provider:  Dr. Parks Ranger on Date: 07-04-2022.  Chronic Condition 1: COPD Provider Assessment and Plan  Relevant Medications     brompheniramine-pseudoephedrine-DM 30-2-10 MG/5ML syrup    predniSONE (DELTASONE) 20 MG tablet    levofloxacin (LEVAQUIN) 500 MG tablet    Other Relevant Orders    AMB Referral to Chronic Care Management Services For Cough / COPD Flare exacerbation Start taking Levaquin antibiotic '500mg'$  daily x 7 days Start Prednisone taper 7 days Rx Cough Syrup rx ordered use for 7 days then discontinue.       Expected Outcome/Goals Addressed This Visit (Provider CCM goals/Provider Assessment and plan   Goal: CCM (COPD) EXPECTED OUTCOME: MONITOR, SELF-MANAGE AND REDUCE SYMPTOMS OF COPD   Symptom Management Condition 1: Take all medications as prescribed Attend all scheduled provider appointments Call provider office for new concerns or questions  Work with the social worker to address care coordination needs and will continue to work with the clinical team to address health care and disease management related needs eliminate smoking in my home identify and remove indoor air pollutants limit outdoor activity during cold weather listen for public air quality announcements every day develop a rescue plan eliminate symptom triggers at home follow rescue plan if symptoms flare-up  Chronic Condition 2: Anxiety and Depression  Provider Assessment and Plan  Relevant Medications     busPIRone (BUSPAR) 10 MG tablet    Other Relevant Orders   Concerning worsening mental health and has been to ED multiple times. Treated with similar meds for panic anxiety.   At home he does well with medication when given, but he does have frequent panic attacks and requesting additional  medication. We discussed risk of polypharmacy and multiple mental health medications, and expressed our concern that we have tried getting him into geriatric psych for a period of time, without success. He has upcoming mental health psych eval from [facility] hopefully they can assist with his med management.   Will adjust meds now Taper down Amitriptyline from '10mg'$  nightly down to HALF tab '5mg'$  nightly for 2 weeks, then discontinue. Increase Buspar (Buspirone) '10mg'$  to now 3 times a day instead of 2. Can be taken in afternoon to help avoid panic anxiety   Okay to take the Lorazepam Ativan ONLY IF NEEDED for more Gibbs anxiety symptoms. Max is 2 times per day,. But not every day.   AMB Referral to Chronic Care Management Services       Expected Outcome/Goals Addressed This Visit (Provider CCM goals/Provider Assessment and plan   Goal: CCM (Anxiety and depression) EXPECTED OUTCOME: MONITOR, SELF-MANAGE AND REDUCE SYMPTOMS OF Anxiety and depression   Symptom Management Condition 2: Take all medications as prescribed Attend all scheduled provider appointments Call provider office for new concerns or questions  call the Suicide and Crisis Lifeline: 988 call the [place] National Suicide Prevention Lifeline: (445) 389-7202 or TTY: (256)154-2703 [place] 443-092-5398) to talk to a trained counselor call 1-800-273-TALK (toll free, 24 hour hotline) if experiencing a Mental Health or [place] Crisis   Problem List Patient Active Problem List   Diagnosis Date Noted   Other pancytopenia ([place]) 06/09/2022   Unable to maintain body in lying position 05/24/2022   Benign paroxysmal positional vertigo    Weakness 01/29/2022   Abdominal pain 09/08/2021   Sensorineural hearing loss (SNHL) of both  ears 07/25/2021   Pain in right knee 03/15/2021   Other chronic pain 03/15/2021   Chronic knee pain (Left) 09/28/2020   Abnormal MRI, knee (08/05/2018) (Left) 09/13/2020   Long-term current use of  benzodiazepine 08/09/2020   Opioid dependence, binge pattern ([place]) 08/09/2020   Opioid dependence with or without physiological dependence ([place]) 08/08/2020   History of substance use disorder 04/15/2020   Osteoarthritis of knee (Left) 03/23/2020   Aortic atherosclerosis ([place]) 02/05/2020   Anxiety state 02/04/2020   Depression with anxiety 12/13/2019   Depression 12/12/2019   COPD (chronic obstructive pulmonary disease) ([place])    HLD (hyperlipidemia)    Iron deficiency anemia    Fall at home, initial encounter    Medial meniscal tear, sequela (Left) 06/10/2019   Patellar tendinosis (Right) 06/10/2019   Lateral meniscal tear, sequela (Left) 03/03/2019   Palpitation 02/13/2019   Preop testing 11/14/2018   Noncompliance with medication treatment due to overuse of medication 10/23/2018   Neurogenic pain 08/20/2018   Atherosclerotic peripheral vascular disease ([place]) 07/24/2018   Tricompartment osteoarthritis of knee (Left) 07/24/2018   Osteoarthritis of knee (Bilateral) 07/24/2018   Osteoarthritis of patellofemoral joint (Right) 07/24/2018   History of suicide attempt (06/12/18) 07/24/2018   Long term current use of opiate analgesic 07/10/2018   Pharmacologic therapy 07/10/2018   Disorder of skeletal system 07/10/2018   Problems influencing health status 07/10/2018   Suicide attempt ([place]) (05/13/18) 32/95/1884   Acute metabolic encephalopathy 16/60/6301   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   Major depressive disorder, recurrent episode, Gibbs ([place]) 06/04/2018   Blindness 05/10/2018   Suicidal ideation 05/10/2018   Chronic pain syndrome 05/01/2018   DISH (diffuse idiopathic skeletal hyperostosis) 05/01/2018   Osteoarthritis of multiple joints 05/01/2018   Chronic knee pain (1ry area of Pain) (Bilateral) (L>R) 05/01/2018   Retinitis pigmentosa of both eyes 05/01/2018   Glaucoma of both eyes 05/01/2018   Essential hypertension 05/01/2018   Chronic low back pain (Bilateral) w/o  sciatica 05/01/2018   GERD (gastroesophageal reflux disease) 05/01/2018   AVM (arteriovenous malformation) of colon 05/01/2018   Therapeutic opioid-induced constipation (OIC) 05/01/2018    Medication Management  Current Outpatient Medications:    acetaminophen (TYLENOL) 500 MG tablet, Take 1,000 mg by mouth in the morning, at noon, and at bedtime., Disp: , Rfl:    brompheniramine-pseudoephedrine-DM 30-2-10 MG/5ML syrup, Take 5 mLs by mouth 4 (four) times daily as needed., Disp: 118 mL, Rfl: 0   bumetanide (BUMEX) 1 MG tablet, Take 0.5 tablets (0.5 mg total) by mouth 2 (two) times daily., Disp: 90 tablet, Rfl: 1   buprenorphine (BUTRANS) 10 MCG/HR PTWK, Place 1 patch onto the skin once a week for 28 days. Apply only 1 patch at a time and alternate sites weekly., Disp: 4 patch, Rfl: 0   buprenorphine (BUTRANS) 10 MCG/HR PTWK, Place 1 patch onto the skin once a week for 28 days. Apply only 1 patch at a time and alternate sites weekly., Disp: 4 patch, Rfl: 0   [START ON 07/19/2022] buprenorphine (BUTRANS) 10 MCG/HR PTWK, Place 1 patch onto the skin once a week for 28 days. Apply only 1 patch at a time and alternate sites weekly., Disp: 4 patch, Rfl: 0   [START ON 08/16/2022] buprenorphine (BUTRANS) 10 MCG/HR PTWK, Place 1 patch onto the skin once a week for 28 days. Apply only 1 patch at a time and alternate sites weekly., Disp: 4 patch, Rfl: 0   busPIRone (BUSPAR) 10 MG tablet, Take 1 tablet (10 mg  total) by mouth 3 (three) times daily as needed (anxiety)., Disp: 90 tablet, Rfl: 3   carvedilol (COREG) 12.5 MG tablet, Take 1 tablet (12.5 mg total) by mouth 2 (two) times daily., Disp: 180 tablet, Rfl: 1   cetirizine (ZYRTEC) 10 MG tablet, Take 10 mg by mouth daily. (Patient not taking: Reported on 06/09/2022), Disp: , Rfl:    clotrimazole-betamethasone (LOTRISONE) cream, Apply topically 2 (two) times daily. For 7-10 days (Patient not taking: Reported on 06/19/2022), Disp: 30 g, Rfl: 2   COSOPT 22.3-6.8  MG/ML ophthalmic solution, Place 1 drop into both eyes 2 (two) times daily., Disp: , Rfl:    diclofenac Sodium (VOLTAREN) 1 % GEL, Apply 2 g topically at bedtime. Apply to both knees twice daily, Disp: 350 g, Rfl: 0   DULoxetine (CYMBALTA) 30 MG capsule, Take 3 capsules (90 mg total) by mouth daily., Disp: 270 capsule, Rfl: 1   finasteride (PROSCAR) 5 MG tablet, Take 1 tablet (5 mg total) by mouth daily., Disp: 90 tablet, Rfl: 3   gabapentin (NEURONTIN) 300 MG capsule, Take 1 capsule (300 mg total) by mouth daily., Disp: 90 capsule, Rfl: 3   Homeopathic Products (ARNICARE) GEL, Apply topically., Disp: , Rfl:    Iron, Ferrous Sulfate, 325 (65 Fe) MG TABS, Take 325 mg by mouth daily with supper., Disp: 30 tablet, Rfl:    latanoprost (XALATAN) 0.005 % ophthalmic solution, Place 1 drop into both eyes at bedtime., Disp: , Rfl:    LORazepam (ATIVAN) 0.5 MG tablet, Take 1 tablet (0.5 mg total) by mouth 2 (two) times daily as needed for anxiety., Disp: 60 tablet, Rfl: 2   losartan (COZAAR) 50 MG tablet, Take 1 tablet (50 mg total) by mouth daily., Disp: 90 tablet, Rfl: 1   Melatonin 5 MG CAPS, Take 1 capsule (5 mg total) by mouth at bedtime., Disp: , Rfl: 0   meloxicam (MOBIC) 15 MG tablet, Take 15 mg by mouth daily., Disp: , Rfl:    nystatin (MYCOSTATIN/NYSTOP) powder, Apply 1 Application topically 3 (three) times daily. For 2-4 weeks., Disp: 15 g, Rfl: 2   OLANZapine (ZYPREXA) 2.5 MG tablet, Take 1 tablet (2.5 mg total) by mouth 2 (two) times daily., Disp: 180 tablet, Rfl: 1   pantoprazole (PROTONIX) 40 MG tablet, Take 1 tablet (40 mg total) by mouth daily., Disp: 90 tablet, Rfl: 3   Propylene Glycol (SYSTANE BALANCE OP), Place 1 drop into the left eye daily., Disp: , Rfl:    tamsulosin (FLOMAX) 0.4 MG CAPS capsule, Take 1 capsule (0.4 mg total) by mouth daily., Disp: 90 capsule, Rfl: 1   traZODone (DESYREL) 150 MG tablet, Take 1 tablet (150 mg total) by mouth at bedtime as needed., Disp: 90 tablet, Rfl:  1  Cognitive Assessment Identity Confirmed: : Name; DOB Cognitive Status: Normal Other:  : HIPPA verified on behalf of the patients caregiver- Charlie- the patients caregiver   Functional Assessment Hearing Difficulty or Deaf: no Wear Glasses or Blind: yes Vision Management: the patient is legally blind Concentrating, Remembering or Making Decisions Difficulty (CP): no Difficulty Communicating: no Difficulty Eating/Swallowing: no Walking or Climbing Stairs Difficulty: yes Walking or Climbing Stairs: ambulation difficulty, requires equipment Mobility Management: uses a walker and has a wheelchair Dressing/Bathing Difficulty: yes Dressing/Bathing: bathing difficulty, assistance 1 person Dressing/Bathing Management: per the caregiver the patient does not like to bathe, he will take a shower every 3 weeks- education provided Doing Errands Independently Difficulty (such as shopping) (CP): yes Errands Management: dependent on his caregiver  and family to help with his needs Change in Functional Status Since Onset of Current Illness/Injury: no   Caregiver Assessment  Primary Source of Support/Comfort: child(ren); nonrelative caregiver Name of Support/Comfort Primary Source: Reina Fuse- caregiver and Kyland No- daughter People in Home: friend(s) Name(s) of People in Home: the patient lives with [name]- his caregiver Family Caregiver if Needed: friend(s); child(ren), adult Family Caregiver Names: [name] and [name] Primary Roles/Responsibilities: retired; disabled Concerns About Impact on Relationships: does not have the best relationships with is family, lives with a friend who is also his caregiver   Planned Interventions  Evaluation of current treatment plan related to depression and anxiety and patient's adherence to plan as established by provider Advised patient to to call the office for changes in mood, anxiety, depression, or mental health needs. Also advised the  caregiver to keep appointments with the mental health provider on 07-21-2022 Provided education to patient re: mindfulness, tactics to help the patient when he is acting out or having extreme anxiety and information to be sent in mail for the caregiver to have as resources to help with mindfulness and other stress reduction activities Reviewed medications with patient and discussed compliance. The caregiver states she is being compliant with his medications. He will scream out for "emergency" anxiety medications often. She states at times he gets verbally abusive with her and threatens to call "Adult services" . Reflective listening and support given to the patients caregiver Collaborated with pcp and LCSW  regarding Hospice declining referral and the expressed needs of the caregiver for helping the patient with his mental health needs Provided patient with mindfulness and other anxiety and depression  educational materials related to decreasing anxiety and depression and panic attacks Reviewed scheduled/upcoming provider appointments including no upcoming appointments with the pcp but the caregiver knows to call for changes or needs.  Social Work referral for assistance with support for caregiver and recommendation on measures to help with depression and anxiety Discussed plans with patient for ongoing care management follow up and provided patient with direct contact information for care management team Advised patient to discuss changes in mental health and well being with provider Screening for signs and symptoms of depression related to chronic disease state  Assessed social determinant of health barriers Provided patient with basic written and verbal COPD education on self care/management/and exacerbation prevention. The patients caregiver states that the patient does well with his breathing unless he starts having a panic attack and he starts hyperventilating. Education on mindfulness exercises and  will send information in the mail to help with ways to help when he gets upset. Advised patient to track and manage COPD triggers. Review of triggers and monitoring for changes in his environment that may increase his risk of COPD exacerbation.  Provided written and verbal instructions on pursed lip breathing and utilized returned demonstration as teach back Provided instruction about proper use of medications used for management of COPD including inhalers Advised patient to self assesses COPD action plan zone and make appointment with provider if in the yellow zone for 48 hours without improvement Advised patient to engage in light exercise as tolerated 3-5 days a week to aid in the the management of COPD Provided education about and advised patient to utilize infection prevention strategies to reduce risk of respiratory infection. Discussed infection prevention and being cautious. The patient does not go out much only to appointments. His caregiver gets things he needs and cares for him in her home.  Discussed the importance  of adequate rest and management of fatigue with COPD Screening for signs and symptoms of depression related to chronic disease state  Assessed social determinant of health barriers  Interaction and coordination with outside resources, practitioners, and providers See CCM Referral  Care Plan: Printed and mailed to patient

## 2022-07-14 ENCOUNTER — Telehealth: Payer: Self-pay

## 2022-07-14 ENCOUNTER — Telehealth: Payer: Self-pay | Admitting: *Deleted

## 2022-07-14 ENCOUNTER — Ambulatory Visit: Payer: Self-pay | Admitting: *Deleted

## 2022-07-14 ENCOUNTER — Encounter: Payer: Self-pay | Admitting: *Deleted

## 2022-07-14 NOTE — Telephone Encounter (Signed)
3 pm.  Patient was evaluated by [facility] yesterday and not eligible for services.  Referral received for Palliative Care.  Caregiver [name] would like to connect with Palliative Care after psych appointment on 2/2.  I will contact caregiver after this appointment.

## 2022-07-14 NOTE — Telephone Encounter (Signed)
Patient is having outburst about his medication.  Chief Complaint: Caregiver is calling to find out if Buspar and Ativan can be given at same time- or need to be spaced- patient is demanding medication and she is afraid to give them too close together- please give her/patient guidance on these medications  Symptoms: anxiety   Disposition: '[]'$ ED /'[]'$ Urgent Care (no appt availability in office) / '[]'$ Appointment(In office/virtual)/ '[]'$  [place] [facility]/ '[]'$ Home Care/ '[]'$ Refused Recommended Disposition /'[]'$  Mobile Bus/ '[x]'$  Follow-up with PCP Additional Notes: Please call patient with instructions on dosing this medication   Reason for Disposition  [1] Caller has URGENT medicine question about med that PCP or specialist prescribed AND [2] triager unable to answer question  Answer Assessment - Initial Assessment Questions 1. NAME of MEDICINE: "What medicine(s) are you calling about?"     Ativan and Buspar 2. QUESTION: "What is your question?" (e.g., double dose of medicine, side effect)     Anxiety control- can these medications be taken at the same time- or do they need to be spaced out- if so how long? 3. PRESCRIBER: "Who prescribed the medicine?" Reason: if prescribed by specialist, call should be referred to that group.     PCP 4. SYMPTOMS: "Do you have any symptoms?" If Yes, ask: "What symptoms are you having?"  "How bad are the symptoms (e.g., mild, moderate, severe)      Increased anxiety and outburst/frustration- patient gets very angry and feels medication is being withheld if he can not get it on demand- needs guidance as far as taking medications together. Caregiver is concerned about giving these medications together and need guidance for patient.  Protocols used: Medication Question Call-A-AH

## 2022-07-14 NOTE — Telephone Encounter (Signed)
Please advise them that both medications can be given at the same time. They have 2 different mechanisms of action and work differently. It is safe to take both at or around the same time.  Aaron Gibbs, [facility] 07/14/2022, 12:26 PM

## 2022-07-14 NOTE — Patient Instructions (Signed)
Visit Information  Thank you for taking time to visit with me today. Please don't hesitate to contact me if I can be of assistance to you.   Following are the goals we discussed today:   Goals Addressed             This Visit's Progress    "I was wondering if he could get some in home help"       Care Coordination Interventions: Patient's caregiver states that patient has been experiencing  a rapid heart beat and difficulty controlling his breathing with the increase in Buspar. Patient refusing to take this medication as a result. Caregiver confirmed that he has been adherent with all other medication accept the [place] has contacted the patient's doctor to inform him of above Caregiver confirmed that patient has been scheduled with a psychiatrist through [place] on 08/12/22 Caregiver requesting the possibility of a [facility] PT referral due to weakness in his legs, currently patient uses a walker while ambulating around the house and a wheelchair outside of the home Consideration of in-home help encouraged : self pay options discussed as patient does not have Medicaid or long term care insurance-at this time, patient unable to pay out of pocket for in home care CSW to discuss possibility of [facility] referral with provider          Please call the care guide team at [phone] if you need to cancel or reschedule your appointment.   If you are experiencing a Mental Health or [place] or need someone to talk to, please call 911   Patient verbalizes understanding of instructions and care plan provided today and agrees to view in [place]. Active MyChart status and patient understanding of how to access instructions and care plan via MyChart confirmed with patient.     Follow up with provider re: concerns regarding medication increase  Eliane Hammersmith, [place] Worker  [facility] Care Management [phone]

## 2022-07-14 NOTE — Patient Outreach (Signed)
  Care Coordination   Initial Visit Note   07/14/2022 Name: Caprice Wasko MRN: [id number] DOB: December 22, 1934  Kairyn Olmeda is a 88 y.o. year old male who sees Olin Hauser, DO for primary care. I spoke with  [name] by phone today.  What matters to the patients health and wellness today?  In home care opitons    Goals Addressed             This Visit's Progress    "I was wondering if he could get some in home help"       Care Coordination Interventions: Patient's caregiver states that patient has been experiencing  a rapid heart beat and difficulty controlling his breathing with the increase in Buspar. Patient refusing to take this medication as a result. Caregiver confirmed that he has been adherent with all other medication accept the Buspar  Caregiver has contacted the patient's doctor to inform him of above Caregiver confirmed that patient has been scheduled with a psychiatrist through [place] on 08/12/22 Caregiver requesting the possibility of a [place] PT referral due to weakness in his legs, currently patient uses a walker while ambulating around the house and a wheelchair outside of the home Consideration of in-home help encouraged : self pay options discussed as patient does not have Medicaid or long term care insurance-at this time, patient unable to pay out of pocket for in home care CSW to discuss possibility of [name] referral with provider         SDOH assessments and interventions completed:  Yes  SDOH Interventions Today    Flowsheet Row Most Recent Value  SDOH Interventions   Food Insecurity Interventions Intervention Not Indicated  Housing Interventions Intervention Not Indicated  Transportation Interventions Intervention Not Indicated        Care Coordination Interventions:  Yes, provided   Follow up plan: No further intervention required.   Encounter Outcome:  Pt. Visit Completed

## 2022-07-14 NOTE — Patient Outreach (Signed)
  Care Coordination   07/14/2022 Name: Aaron Gibbs MRN: [id number] DOB: April 14, 1935   Care Coordination Outreach Attempts:  An unsuccessful telephone outreach was attempted for a scheduled appointment today.  Follow Up Plan:  Additional outreach attempts will be made to offer the patient care coordination information and services.   Encounter Outcome:  No Answer   Care Coordination Interventions:  No, not indicated    Deshara Rossi, [place] Worker  [facility] Care Management [phone]

## 2022-07-17 ENCOUNTER — Encounter: Payer: Self-pay | Admitting: Family Medicine

## 2022-07-17 ENCOUNTER — Ambulatory Visit: Payer: Self-pay | Admitting: *Deleted

## 2022-07-17 ENCOUNTER — Telehealth (INDEPENDENT_AMBULATORY_CARE_PROVIDER_SITE_OTHER): Payer: Medicare Other | Admitting: Family Medicine

## 2022-07-17 VITALS — Wt 236.0 lb

## 2022-07-17 DIAGNOSIS — R531 Weakness: Secondary | ICD-10-CM

## 2022-07-17 DIAGNOSIS — R2681 Unsteadiness on feet: Secondary | ICD-10-CM

## 2022-07-17 DIAGNOSIS — F411 Generalized anxiety disorder: Secondary | ICD-10-CM | POA: Diagnosis not present

## 2022-07-17 DIAGNOSIS — F410 Panic disorder [episodic paroxysmal anxiety] without agoraphobia: Secondary | ICD-10-CM

## 2022-07-17 DIAGNOSIS — R29898 Other symptoms and signs involving the musculoskeletal system: Secondary | ICD-10-CM

## 2022-07-17 DIAGNOSIS — Z515 Encounter for palliative care: Secondary | ICD-10-CM | POA: Insufficient documentation

## 2022-07-17 MED ORDER — LORAZEPAM 0.5 MG PO TABS: 0.5000 mg | ORAL_TABLET | Freq: Three times a day (TID) | ORAL | 2 refills | Status: DC

## 2022-07-17 NOTE — Patient Instructions (Addendum)
Thank you for coming to the office today.  Stop taking Buspar since it is not working and provoking some anxiety  INCREASE Lorazepam (Ativan) to 0.'5mg'$  3 times per day. Keep it regular. No skipped doses on purpose. Caution if out of med or missing more than 1-2 days will WORSEN anxiety and cause withdrawal.  In the future we can taper down gradually.  Please schedule a Follow-up Appointment to: Return in about 6 weeks (around 08/28/2022) for 6 week follow-up Anxiety / med adjust / COPD / Palliative updates.  If you have any other questions or concerns, please feel free to call the office or send a message through [place]. You may also schedule an earlier appointment if necessary.  Additionally, you may be receiving a survey about your experience at our office within a few days to 1 week by e-mail or mail. We value your feedback.  [name], DO [place]

## 2022-07-17 NOTE — Progress Notes (Addendum)
Subjective:    Patient ID: Jaquise Gibbs, male    DOB: 08/17/1934, 88 y.o.   MRN: [id number]  Aaron Gibbs is a 88 y.o. male presenting on 07/17/2022 for Anxiety  Here with caregiver, [name].  HPI  Generalized Anxiety Disorder, Panic Attack Major Depression recurrent severe Chronic history of mental health problems as we have followed and discussed over period of few years.  ED visit 1/1 and 1/2 for anxiety and mood. He has daily panic attack anxiety worsening. Previously unsuccessful getting him into Psychiatry for Geriatric, [facility] and [facility] can send Psych soon. Will be on 07/21/22 Psych through [facility].   He admits panic attack. daily usually will occur 1 hour after meds sometimes. He has been on Buspar '10mg'$  THREE TIMES A DAY now and believes this is triggering his panic attacks, he came off of it and felt better but still has the anxiety flare panic attacks. He has done best on higher frequency Lorazepam in the past.  Weakness of Lower Extremities Unsteady Gait / Balance problem He is mostly wheelchair bound and can transfer with assistance. He is in need of further therapy to improve his strength and mobility. He is unable to leave home safely      07/17/2022    3:31 PM 07/04/2022    3:06 PM 06/06/2022    3:32 PM  Depression screen PHQ 2/9  Decreased Interest '3 2 1  '$ Down, Depressed, Hopeless '3 1 1  '$ PHQ - 2 Score '6 3 2  '$ Altered sleeping '3 1 3  '$ Tired, decreased energy 0 3 3  Change in appetite 0 2 3  Feeling bad or failure about yourself  3  0  Trouble concentrating 3 0 0  Moving slowly or fidgety/restless 3 0 0  Suicidal thoughts 3 0 0  PHQ-9 Score '21 9 11  '$ Difficult doing work/chores Very difficult Somewhat difficult Not difficult at all   [place]-Suicide Severity Rating Scale 1) Have you wished you were dead or wished you could go to sleep and not wake up? - Yes  2) Have you had any actual thoughts of killing yourself? - No  Skip questions  3,4, 5  6) Have you ever done anything, started to do anything, or prepared to do anything to end your life? - No   Social History   Tobacco Use   Smoking status: Former    Years: 20.00    Types: Cigarettes   Smokeless tobacco: Current    Types: Chew   Tobacco comments:    stopped 15 years ago  Vaping Use   Vaping Use: Never used  Substance Use Topics   Alcohol use: Not Currently    Comment: past   Drug use: Never    Review of Systems Per HPI unless specifically indicated above     Objective:    Wt 236 lb (107 kg)   BMI 32.92 kg/m   Wt Readings from Last 3 Encounters:  07/17/22 236 lb (107 kg)  07/10/22 236 lb (107 kg)  06/19/22 240 lb 4.8 oz (109 kg)    Physical Exam Vitals and nursing note reviewed.  Constitutional:      General: He is not in acute distress.    Appearance: Normal appearance. He is well-developed. He is obese. He is not diaphoretic.     Comments: Chronically ill appearing elderly 88 yr male  HENT:     Head: Normocephalic and atraumatic.  Eyes:     General:  Right eye: No discharge.        Left eye: No discharge.     Conjunctiva/sclera: Conjunctivae normal.  Cardiovascular:     Rate and Rhythm: Normal rate.  Pulmonary:     Comments: Reduced air movement. Increased respiratory effort with anxiety but seems to calm with discussion. No wheezing Musculoskeletal:     Comments: Limited to wheelchair  Skin:    General: Skin is warm and dry.     Findings: No erythema or rash.  Neurological:     Mental Status: He is alert and oriented to person, place, and time.  Psychiatric:        Mood and Affect: Mood normal.        Behavior: Behavior normal.        Thought Content: Thought content normal.     Comments: Well groomed, good eye contact, normal speech and thoughts    Results for orders placed or performed during the hospital encounter of 06/19/22  Comprehensive metabolic panel  Result Value Ref Range   Sodium 141 135 - 145 mmol/L    Potassium 3.9 3.5 - 5.1 mmol/L   Chloride 110 98 - 111 mmol/L   CO2 24 22 - 32 mmol/L   Glucose, Bld 137 (H) 70 - 99 mg/dL   BUN 19 8 - 23 mg/dL   Creatinine, Ser 1.17 0.61 - 1.24 mg/dL   Calcium 9.1 8.9 - 10.3 mg/dL   Total Protein 7.4 6.5 - 8.1 g/dL   Albumin 4.0 3.5 - 5.0 g/dL   AST 33 15 - 41 U/L   ALT 24 0 - 44 U/L   Alkaline Phosphatase 63 38 - 126 U/L   Total Bilirubin 0.8 0.3 - 1.2 mg/dL   GFR, Estimated >60 >60 mL/min   Anion gap 7 5 - 15  Ethanol  Result Value Ref Range   Alcohol, Ethyl (B) <03 <47 mg/dL  Salicylate level  Result Value Ref Range   Salicylate Lvl <4.2 (L) 7.0 - 30.0 mg/dL  Acetaminophen level  Result Value Ref Range   Acetaminophen (Tylenol), Serum <10 (L) 10 - 30 ug/mL  cbc  Result Value Ref Range   WBC 3.3 (L) 4.0 - 10.5 K/uL   RBC 4.45 4.22 - 5.81 MIL/uL   Hemoglobin 11.2 (L) 13.0 - 17.0 g/dL   HCT 38.2 (L) 39.0 - 52.0 %   MCV 85.8 80.0 - 100.0 fL   MCH 25.2 (L) 26.0 - 34.0 pg   MCHC 29.3 (L) 30.0 - 36.0 g/dL   RDW 21.4 (H) 11.5 - 15.5 %   Platelets 134 (L) 150 - 400 K/uL   nRBC 0.0 0.0 - 0.2 %  Urine Drug Screen, Qualitative  Result Value Ref Range   Tricyclic, Ur Screen POSITIVE (A) NONE DETECTED   Amphetamines, Ur Screen NONE DETECTED NONE DETECTED   MDMA (Ecstasy)Ur Screen NONE DETECTED NONE DETECTED   Cocaine Metabolite,Ur [place] NONE DETECTED NONE DETECTED   Opiate, Ur Screen NONE DETECTED NONE DETECTED   Phencyclidine (PCP) Ur S NONE DETECTED NONE DETECTED   Cannabinoid 50 Ng, Ur [place] NONE DETECTED NONE DETECTED   Barbiturates, Ur Screen NONE DETECTED NONE DETECTED   Benzodiazepine, Ur Scrn NONE DETECTED NONE DETECTED   Methadone Scn, Ur NONE DETECTED NONE DETECTED      Assessment & Plan:   Problem List Items Addressed This Visit     Generalized anxiety disorder with panic attacks - Primary   Relevant Medications   LORazepam (ATIVAN) 0.5 MG tablet  Palliative care patient    Anxiety panic attacks Severe chronic  problem Depression underlying  Mental Health Psychiatry 07/21/22 - discuss medications   Discontinue Buspar '10mg'$  THREE TIMES A DAY, since ineffective and seemed to provoke symptoms  Increase Lorazepam from 0.'5mg'$  twice a day up to 3 times per day, try to keep more scheduled dosing instead of AS NEEDED to avoid withdrawal and flares of panic  He was not accepted into hospice, but accepted into Palliative Care, that service should start soon.  Working with CCM team  #Weakness in [place] gait/Balance Referral to [facility] today, due to difficulty leaving home and he would benefit from further therapy exercise mobility.   Meds ordered this encounter  Medications   LORazepam (ATIVAN) 0.5 MG tablet    Sig: Take 1 tablet (0.5 mg total) by mouth in the morning, at noon, and at bedtime.    Dispense:  90 tablet    Refill:  2    Frequency increase from 2 to 3 times per day.      Follow up plan: Return in about 6 weeks (around 08/28/2022) for 6 week follow-up Anxiety / med adjust / COPD / Palliative updates.  [name], [facility] 07/17/2022, 3:37 PM

## 2022-07-17 NOTE — Addendum Note (Signed)
Addended by: Olin Hauser on: 07/17/2022 06:06 PM   Modules accepted: Orders

## 2022-07-17 NOTE — Telephone Encounter (Signed)
Caregiver aware.

## 2022-07-17 NOTE — Telephone Encounter (Signed)
  Chief Complaint: medication review requested- patient is needed more than prescribed- needing medication everyday - twice daily and can not skip a day Symptoms: anxiety, aggegation  Frequency: needing Ativan  0.'5mg'$  twice/daily- gets very agitated without it- patient has stopped Buspar Pertinent Negatives: Patient denies   Disposition: '[]'$ ED /'[]'$ Urgent Care (no appt availability in office) / '[x]'$ Appointment(In office/virtual)/ '[]'$  [facility]/ '[]'$ Home Care/ '[]'$ Refused Recommended Disposition /'[]'$ [facility]/ '[]'$  Follow-up with PCP Additional Notes: Patient has been asking for anxiety medication twice daily- caregiver is having a hard time with patinet- he gets very agitated and screams at her if he does not get it. Patient needs review if medication because it is causing problems with their working relationship. Patient took 2 yesterday- he is demanding one today. Permission to give medication if patient is agitated and appointment has ben given for review - change if medications.   Reason for Disposition  [1] Pharmacy calling with prescription question AND [2] triager unable to answer question  Answer Assessment - Initial Assessment Questions 1. NAME of MEDICINE: "What medicine(s) are you calling about?"     Ativan  2. QUESTION: "What is your question?" (e.g., double dose of medicine, side effect)     Patient is needing to have medications reviewed- anxiety is worse and patient is needing more medication than prescribed 3. PRESCRIBER: "Who prescribed the medicine?" Reason: if prescribed by specialist, call should be referred to that group.     PCP  Protocols used: Medication Question Call-A-AH

## 2022-07-17 NOTE — Telephone Encounter (Signed)
Please notify patient / caregiver that for this medication review appointment, yes Virtual Apt is fine. Thank you  [name], DO [facility] 07/17/2022, 9:56 AM

## 2022-07-19 ENCOUNTER — Telehealth: Payer: Self-pay | Admitting: Family Medicine

## 2022-07-19 DIAGNOSIS — F32A Depression, unspecified: Secondary | ICD-10-CM | POA: Diagnosis not present

## 2022-07-19 DIAGNOSIS — J449 Chronic obstructive pulmonary disease, unspecified: Secondary | ICD-10-CM | POA: Diagnosis not present

## 2022-07-19 NOTE — Telephone Encounter (Signed)
Tiffany from [facility], called in about pt orders for patient, just wanted to let us know that patient doesn't want them to come out until Friday. Dial opt 2 with phone 3# for contact at [facility]

## 2022-07-19 NOTE — Telephone Encounter (Signed)
Okay to proceed with this [place], [facility] 07/19/2022, 3:10 PM

## 2022-07-21 ENCOUNTER — Telehealth: Payer: Self-pay | Admitting: Family Medicine

## 2022-07-21 NOTE — Telephone Encounter (Signed)
Home Health Verbal Orders - Caller/Agency: Chris/ [facility]  Callback Number: [phone]/ vm can be left  Requesting Pt  Frequency: 2x's a week for 3 weeks and  1x a week for 5 weeks

## 2022-07-21 NOTE — Telephone Encounter (Signed)
Please proceed with verbal orders  [name], [facility] 07/21/2022, 3:34 PM

## 2022-07-24 ENCOUNTER — Telehealth: Payer: Self-pay

## 2022-07-24 NOTE — Telephone Encounter (Signed)
8 am.  Home visit scheduled for 07/26/22 @ 230.

## 2022-07-25 NOTE — Telephone Encounter (Signed)
Message left

## 2022-07-26 ENCOUNTER — Other Ambulatory Visit: Payer: Medicare Other

## 2022-07-26 VITALS — BP 140/90 | HR 70 | Temp 97.6°F | Wt 226.0 lb

## 2022-07-26 DIAGNOSIS — Z515 Encounter for palliative care: Secondary | ICD-10-CM

## 2022-07-26 NOTE — Progress Notes (Unsigned)
COMMUNITY PALLIATIVE CARE SW NOTE  PATIENT NAME: Aaron Gibbs DOB: 1934-07-15 MRN: [id number]  PRIMARY CARE PROVIDER: Olin Hauser, DO  RESPONSIBLE PARTY:  Acct ID - Guarantor Home Phone Work Phone Relationship Acct Type  [id number] DAYVIN, [phone]  Self P/F     [address], [place], [place] [address]     PLAN OF CARE and INTERVENTIONS:        GOALS OF CARE/ ADVANCE CARE PLANNING:    Goals include to maximize quality of life and assist with pain management. Our advance care planning conversation included a discussion about:    The value and importance of advance care planning  Review and updating or creation of an advance directive document.                          Patient is a DNR. Patients daughter, [name], has a HCPOA.   2.        SOCIAL/EMOTIONAL/SPIRITUAL ASSESSMENT/ INTERVENTIONS:         Palliative care encounter: SW and RN completed initial joint in home visit with patient and caregiver Charlie.   Functional changes/updates: Patient with recent hospcie evaluation and was deemed not eligible for hospoce at this time.  [facility] - providing nurse and psychologist. [facility]: providing [facility]. 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 SW will connect patient with [facility] to proivde more support and information. MH: patient shares that his anxiety is better this week. GAD-7 score 0 this week indicating minimal anxiety. Patient denies SI. Patient is taking Ativan TID and reports anxiety has decreased since starting this regimen. PC will continue to monitor.    Psychosocial assessment: completed.   In home support: patient lives caregiver, charlie. Has [facility] involved as well as [facility].  Transportation: no needs.  Food: no food insecurities witnessed.   Safety and long term planning: patient feels safe in his home and desires to remain in home with caregiver.   SW discussed goals, reviewed care  plan, provided emotional support, used active and reflective listening in the form of reciprocity emotional response. Questions and concerns were addressed. The patient/family was encouraged to call with any additional questions and/or concerns. PC Provided general support and encouragement, no other unmet needs identified. Will continue to follow.   3.         PATIENT/CAREGIVER EDUCATION/ COPING:   Appearance: well groomed, appropriate given situation  Mental Status: Alert/oriented. Eye Contact: poor. Legally blind  Thought Process: rational  Thought Content: not assessed  Speech: normal  Mood: Normal and calm Affect: Congruent to endorsed mood, full ranging Insight: normal Judgement: normal  Interaction Style: Cooperative   Patient A&O, patient engaged in fluent conversation and answered all questions appropriately. No cognitive deficits witnessed. Patient hx of anxiety and depression. Patient currently listnes to audiobooks, which he finds calming.    4.         PERSONAL EMERGENCY PLAN:  Patient/caregiver will call 9-1-1 for emergencies.    5.         COMMUNITY RESOURCES COORDINATION/ HEALTH CARE NAVIGATION:  patients daughter and caregiver manages his care.    6.      FINANCIAL CONCERNS/NEEDS: none                          Primary Health Insurance:  [facility] Medicare Secondary Health Insurance: none Prescription Coverage: Yes, no history of  difficulty obtaining or affording prescriptions reported.     SOCIAL HX:  Social History   Tobacco Use   Smoking status: Former    Years: 20.00    Types: Cigarettes   Smokeless tobacco: Current    Types: Chew   Tobacco comments:    stopped 15 years ago  Substance Use Topics   Alcohol use: Not Currently    Comment: past    CODE STATUS: DNR ADVANCED DIRECTIVES: Y MOST FORM COMPLETE:  N HOSPICE EDUCATION PROVIDED: Y  PPS: patient is MIN-SUP with ADL's.  Time spent 40 min      [place],

## 2022-07-26 NOTE — Progress Notes (Unsigned)
PATIENT NAME: Aaron Gibbs DOB: 05/04/1935 MRN: [id number]  PRIMARY CARE PROVIDER: Olin Hauser, DO  RESPONSIBLE PARTY:  Acct ID - Guarantor Home Phone Work Phone Relationship Acct Type  [id number] ELLISON, [phone]  Self P/F     [address] [place], [place] [address]   Home visit completed with patient, caregiver [name] and virtually with [place], SW.   Anxiety:  Much improved with recent medication adjustments.  [facility] psychology saw patient last Friday.  They will do follow ups by phone per caregiver Charlie.   Home Health: Currently followed by [facility] for PT.  Patient is currently seeing PT 2x weekly.   Hospice vs Palliative Care:  Patient was recently evaluated for hospice but found not eligible. Discussed differences between programs and criteria.   Medication Management:  Charlie manages patient's medication.  Has updated list of medications and utilizing a pill box.  Mobility:   Using a rolling walker for mobility.  No falls reported.  Skin:  Healing stage 2 right buttock wound.  Discussed use of barrier cream to area.  Vision:  Patient shared he was recently deemed legally blind.  Caregiver is looking for resources for patient due to this new dx.  She is concerned he will withdraw more and feel isolated.  SW will connect patient and caregiver with resources.  CODE STATUS: DNR-form in the home. ADVANCED DIRECTIVES: Yes-daughter [name]. MOST FORM: No PPS: 50%   PHYSICAL EXAM:   VITALS: Today's Vitals   07/26/22 1429  BP: (!) 140/90  Pulse: 70  Temp: 97.6 F (36.4 C)  SpO2: 96%  Weight: 226 lb (102.5 kg)    LUNGS: clear to auscultation  CARDIAC: Cor RRR}  EXTREMITIES: - for edema SKIN: Skin color, texture, turgor normal. No rashes or lesions or right buttock with healing stage 2 wound.   NEURO: positive for gait problems       Lorenza Burton, RN

## 2022-07-31 ENCOUNTER — Telehealth: Payer: Self-pay | Admitting: Family Medicine

## 2022-07-31 NOTE — Telephone Encounter (Signed)
Pts daughter Derreon Nazari is calling back from a call on 07/27/22 Please advise CB- [phone]

## 2022-08-04 ENCOUNTER — Telehealth: Payer: Self-pay | Admitting: Family Medicine

## 2022-08-04 NOTE — Telephone Encounter (Signed)
Tiffany with [facility] is calling to see if pt has a diagnosis of osteoarthritis. Tiffany states pt is legally blind.  Please call Tiffany back: [phone]  opt 2

## 2022-08-04 NOTE — Telephone Encounter (Signed)
I called her back and left a message letting her know that yes, he does have a diagnosis of osteoarthritis.

## 2022-08-23 ENCOUNTER — Other Ambulatory Visit: Payer: Medicare Other

## 2022-08-23 VITALS — BP 140/74 | HR 58 | Temp 97.6°F

## 2022-08-23 DIAGNOSIS — Z515 Encounter for palliative care: Secondary | ICD-10-CM

## 2022-08-23 NOTE — Progress Notes (Signed)
PATIENT NAME: Aaron Gibbs DOB: 01/24/1935 MRN: [id number]  PRIMARY CARE PROVIDER: Olin Hauser, DO  RESPONSIBLE PARTY:  Acct ID - Guarantor Home Phone Work Phone Relationship Acct Type  [id number] MARKICE, [phone]  Self P/F     [address], [place], [place] [address]   Home visit completed patient and caregiver Charlie.   Anxiety:  Continues to be well managed at this time.  Patient states he has been listening to audio books as a distraction.  This has been very helpful.   I contacted [employer].  They also have audio books in house and have online audio books patient can access with a [employer] card.  [name] is update on this and will obtain a Art therapist card for patient.   Functional Status:  Patient is able to shower himself.  Caregiver is assisting with dressing.  Patient continues with a rolling walker and requires one person assistance with ambulation as patient is legally blind.   No recent falls.  Patient remains in the bed most of the day.  He does not enjoy listening to the television only audio books.  Continues to feed himself.  No changes with appetite reported.   Home Health:  Patient will continue with PT for the next 3 weeks and then discharge from services.   Resources:  Palliative Care SW requested [facility] contact patient regarding resources last month but patient/caregiver did not hear from any one.  Phone call made to [facility] with DSS to follow up.   No answer.  Message left requesting a call back.   [facility] remains in place and caregiver is expecting phone contact from them soon.  Wound:  right buttock wound is now healed.     CODE STATUS: DNR ADVANCED DIRECTIVES: N MOST FORM: No PPS: 40%   PHYSICAL EXAM:   VITALS: Today's Vitals   08/23/22 1321  BP: (!) 140/74  Pulse: (!) 58  Temp: 97.6 F (36.4 C)  SpO2: 96%    LUNGS: clear to auscultation  CARDIAC: Cor RRR}  EXTREMITIES: negative SKIN:  Skin color, texture, turgor normal. No rashes or lesions or normal  NEURO: positive for gait problems       Lorenza Burton, RN

## 2022-08-28 ENCOUNTER — Telehealth: Payer: Medicare Other

## 2022-08-28 ENCOUNTER — Ambulatory Visit (INDEPENDENT_AMBULATORY_CARE_PROVIDER_SITE_OTHER): Payer: Medicare Other

## 2022-08-28 DIAGNOSIS — F410 Panic disorder [episodic paroxysmal anxiety] without agoraphobia: Secondary | ICD-10-CM

## 2022-08-28 DIAGNOSIS — J432 Centrilobular emphysema: Secondary | ICD-10-CM

## 2022-08-28 DIAGNOSIS — F332 Major depressive disorder, recurrent severe without psychotic features: Secondary | ICD-10-CM

## 2022-08-28 NOTE — Chronic Care Management (AMB) (Signed)
Chronic Care Management   CCM RN Visit Note  08/28/2022 Name: Aaron Gibbs MRN: [id number] DOB: July 12, 1934  Subjective: Aaron Gibbs is a 88 y.o. year old male who is a primary care patient of Olin Hauser, DO. The patient was referred to the Chronic Care Management team for assistance with care management needs subsequent to provider initiation of CCM services and plan of care.    Today's Visit:   Engaged with Aaron Gibbs the patients caregiver and DRP  for follow up visit.        Goals Addressed             This Visit's Progress    CCM Expected Outcome:  Monitor, Self-Manage and Reduce Symptoms of: Anxiety/Depression       Current Barriers:  Knowledge Deficits related to resources available to help the patient with mental health needs.  Care Coordination needs related to ongoing support and education of managing panic attacks, anxiety, and depression  in a patient with anxiety and depression Chronic Disease Management support and education needs related to effective management of depression and anxiety  Planned Interventions: Evaluation of current treatment plan related to depression and anxiety and patient's adherence to plan as established by provider. Aaron Gibbs the patients caregiver states that patient is doing much better now. He is working with home health and moving around a lot better. Changes in his medications have been very helpful. She  states he is not depressed and he is not having any panic attacks. She is so thankful for positive changes she is seeing in the patient.  Education and support given.  Advised patient to to call the office for changes in mood, anxiety, depression, or mental health needs. Also advised the caregiver to keep appointments with the mental health. [facility] is working with the patient in the home setting also. The patient is doing much better and Aaron Gibbs states the changes in medications has been so helpful.   Provided  education to patient re: mindfulness, tactics to help the patient when he is acting out or having extreme anxiety and information to be sent in mail for the caregiver to have as resources to help with mindfulness and other stress reduction activities Reviewed medications with patient and discussed compliance. The caregiver assist with medications and states compliance with medications. Denies any acute findings today. Is happy to report that the last change by the pcp has had positive results.  Collaborated with pcp and LCSW  regarding Hospice declining referral and the expressed needs of the caregiver for helping the patient with his mental health needs Provided patient with mindfulness and other anxiety and depression  educational materials related to decreasing anxiety and depression and panic attacks Reviewed scheduled/upcoming provider appointments including no upcoming appointments with the pcp but the caregiver knows to call for changes or needs.  Social Work referral for assistance with support for caregiver and recommendation on measures to help with depression and anxiety Discussed plans with patient for ongoing care management follow up and provided patient with direct contact information for care management team Advised patient to discuss changes in mental health and well being with provider Screening for signs and symptoms of depression related to chronic disease state  Assessed social determinant of health barriers  Symptom Management: Take medications as prescribed   Attend all scheduled provider appointments Call provider office for new concerns or questions  call the Suicide and Crisis Lifeline: 988 call the [place] National Suicide Prevention Lifeline: (272)602-0535 or TTY: (740)143-0196 TTY (  4312779184) to talk to a trained counselor call 1-800-273-TALK (toll free, 24 hour hotline) if experiencing a Mental Health or [place]   Follow Up Plan: Telephone follow up  appointment with care management team member scheduled for: 10-30-2022 at 1 pm       CCM:  Maintain, Monitor and Self-Manage Symptoms of COPD       Current Barriers:  Knowledge Deficits related to progression of COPD and how to effectively manage Care Coordination needs related to caregiver resources for helping in the care of the patient in a patient with COPD Chronic Disease Management support and education needs related to effective management of COPD  Planned Interventions: Provided patient with basic written and verbal COPD education on self care/management/and exacerbation prevention. The patients COPD is stable. Since changes in his medications and getting him on a routine the patient is doing much better. The patients caregiver states that [facility] is coming in and working with him and they are helping him with his activity and it has made a positive difference. 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 [name] has went to [employer] and gotten him some audio books and this is giving him some independence as well. She is trying to find a solution to help him be able to move independently from his room to other rooms in the house. She is open for recommendations and ideas.  Advised patient to track and manage COPD triggers. Review of triggers and monitoring for changes in his environment that may increase his risk of COPD exacerbation.  Provided written and verbal instructions on pursed lip breathing and utilized returned demonstration as teach back Provided instruction about proper use of medications used for management of COPD including inhalers Advised patient to self assesses COPD action plan zone and make appointment with provider if in the yellow zone for 48 hours without improvement Advised patient to engage in light exercise as tolerated 3-5 days a week to aid in the the management of COPD Provided education about and advised patient to utilize infection prevention  strategies to reduce risk of respiratory infection. Discussed infection prevention and being cautious. The patient does not go out much only to appointments. His caregiver gets things he needs and cares for him in her home.  Discussed the importance of adequate rest and management of fatigue with COPD Screening for signs and symptoms of depression related to chronic disease state  Assessed social determinant of health barriers  Symptom Management: Take medications as prescribed   Attend all scheduled provider appointments Call provider office for new concerns or questions  call the Suicide and Crisis Lifeline: 988 call the [place] National Suicide Prevention Lifeline: 548 045 0558 or TTY: (825)102-2042 TTY 7092765166) to talk to a trained counselor call 1-800-273-TALK (toll free, 24 hour hotline) if experiencing a Mental Health or [place]  eliminate smoking in my home identify and remove indoor air pollutants limit outdoor activity during cold weather develop a rescue plan eliminate symptom triggers at home follow rescue plan if symptoms flare-up  Follow Up Plan: Telephone follow up appointment with care management team member scheduled for: 10-30-2022-2024 at 1 pm          Plan:Telephone follow up appointment with care management team member scheduled for:  10-30-2022 at 1 pm  [place], MSN, CCM RN Care Manager  Chronic Care Management Direct Number: [phone]

## 2022-08-28 NOTE — Patient Instructions (Signed)
Please call the care guide team at [phone] if you need to cancel or reschedule your appointment.   If you are experiencing a Mental Health or [place] or need someone to talk to, please call the Suicide and Crisis Lifeline: 988 call the [place] National Suicide Prevention Lifeline: 765-528-6646 or TTY: 575 804 5749 TTY 479 801 0275) to talk to a trained counselor call 1-800-273-TALK (toll free, 24 hour hotline)   Following is a copy of the CCM Program Consent:  CCM service includes personalized support from designated clinical staff supervised by the physician, including individualized plan of care and coordination with other care providers 24/7 contact phone numbers for assistance for urgent and routine care needs. Service will only be billed when office clinical staff spend 20 minutes or more in a month to coordinate care. Only one practitioner may furnish and bill the service in a calendar month. The patient may stop CCM services at amy time (effective at the end of the month) by phone call to the office staff. The patient will be responsible for cost sharing (co-pay) or up to 20% of the service fee (after annual deductible is met)  Following is a copy of your full provider care plan:   Goals Addressed             This Visit's Progress    CCM Expected Outcome:  Monitor, Self-Manage and Reduce Symptoms of: Anxiety/Depression       Current Barriers:  Knowledge Deficits related to resources available to help the patient with mental health needs.  Care Coordination needs related to ongoing support and education of managing panic attacks, anxiety, and depression  in a patient with anxiety and depression Chronic Disease Management support and education needs related to effective management of depression and anxiety  Planned Interventions: Evaluation of current treatment plan related to depression and anxiety and patient's adherence to plan as established by provider.  Charlie the patients caregiver states that patient is doing much better now. He is working with home health and moving around a lot better. Changes in his medications have been very helpful. She  states he is not depressed and he is not having any panic attacks. She is so thankful for positive changes she is seeing in the patient.  Education and support given.  Advised patient to to call the office for changes in mood, anxiety, depression, or mental health needs. Also advised the caregiver to keep appointments with the mental health. [facility] is working with the patient in the home setting also. The patient is doing much better and Charlie states the changes in medications has been so helpful.   Provided education to patient re: mindfulness, tactics to help the patient when he is acting out or having extreme anxiety and information to be sent in mail for the caregiver to have as resources to help with mindfulness and other stress reduction activities Reviewed medications with patient and discussed compliance. The caregiver assist with medications and states compliance with medications. Denies any acute findings today. Is happy to report that the last change by the pcp has had positive results.  Collaborated with pcp and LCSW  regarding Hospice declining referral and the expressed needs of the caregiver for helping the patient with his mental health needs Provided patient with mindfulness and other anxiety and depression  educational materials related to decreasing anxiety and depression and panic attacks Reviewed scheduled/upcoming provider appointments including no upcoming appointments with the pcp but the caregiver knows to call for changes or needs.  Social Work referral for assistance with support for caregiver and recommendation on measures to help with depression and anxiety Discussed plans with patient for ongoing care management follow up and provided patient with direct contact information for care  management team Advised patient to discuss changes in mental health and well being with provider Screening for signs and symptoms of depression related to chronic disease state  Assessed social determinant of health barriers  Symptom Management: Take medications as prescribed   Attend all scheduled provider appointments Call provider office for new concerns or questions  call the Suicide and Crisis Lifeline: 988 call the [place] National Suicide Prevention Lifeline: 361-425-9298 or TTY: (407)725-3655 TTY (212) 884-9265) to talk to a trained counselor call 1-800-273-TALK (toll free, 24 hour hotline) if experiencing a Mental Health or [place]   Follow Up Plan: Telephone follow up appointment with care management team member scheduled for: 10-30-2022 at 1 pm       CCM:  Maintain, Monitor and Self-Manage Symptoms of COPD       Current Barriers:  Knowledge Deficits related to progression of COPD and how to effectively manage Care Coordination needs related to caregiver resources for helping in the care of the patient in a patient with COPD Chronic Disease Management support and education needs related to effective management of COPD  Planned Interventions: Provided patient with basic written and verbal COPD education on self care/management/and exacerbation prevention. The patients COPD is stable. Since changes in his medications and getting him on a routine the patient is doing much better. The patients caregiver states that [facility] is coming in and working with him and they are helping him with his activity and it has made a positive difference. 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 [name] has went to [employer] and gotten him some audio books and this is giving him some independence as well. She is trying to find a solution to help him be able to move independently from his room to other rooms in the house. She is open for recommendations and ideas.  Advised  patient to track and manage COPD triggers. Review of triggers and monitoring for changes in his environment that may increase his risk of COPD exacerbation.  Provided written and verbal instructions on pursed lip breathing and utilized returned demonstration as teach back Provided instruction about proper use of medications used for management of COPD including inhalers Advised patient to self assesses COPD action plan zone and make appointment with provider if in the yellow zone for 48 hours without improvement Advised patient to engage in light exercise as tolerated 3-5 days a week to aid in the the management of COPD Provided education about and advised patient to utilize infection prevention strategies to reduce risk of respiratory infection. Discussed infection prevention and being cautious. The patient does not go out much only to appointments. His caregiver gets things he needs and cares for him in her home.  Discussed the importance of adequate rest and management of fatigue with COPD Screening for signs and symptoms of depression related to chronic disease state  Assessed social determinant of health barriers  Symptom Management: Take medications as prescribed   Attend all scheduled provider appointments Call provider office for new concerns or questions  call the Suicide and Crisis Lifeline: 988 call the [place] National Suicide Prevention Lifeline: 865-232-8829 or TTY: 731-357-5289 TTY 216-634-4735) to talk to a trained counselor call 1-800-273-TALK (toll free, 24 hour hotline) if experiencing a Mental Health or  Behavioral Health Crisis  eliminate smoking in my home identify and remove indoor air pollutants limit outdoor activity during cold weather develop a rescue plan eliminate symptom triggers at home follow rescue plan if symptoms flare-up  Follow Up Plan: Telephone follow up appointment with care management team member scheduled for: 10-30-2022-2024 at 1 pm           Patient verbalizes understanding of instructions and care plan provided today and agrees to view in [place]. Active MyChart status and patient understanding of how to access instructions and care plan via MyChart confirmed with patient.     Telephone follow up appointment with care management team member scheduled for: 10-30-2022 at 1 pm

## 2022-08-31 ENCOUNTER — Other Ambulatory Visit: Payer: Self-pay | Admitting: Family Medicine

## 2022-08-31 DIAGNOSIS — F332 Major depressive disorder, recurrent severe without psychotic features: Secondary | ICD-10-CM

## 2022-09-01 NOTE — Telephone Encounter (Signed)
Requested Prescriptions  Pending Prescriptions Disp Refills   traZODone (DESYREL) 150 MG tablet [Pharmacy Med Name: traZODone HCl 150 MG Oral Tablet] 90 tablet 0    Sig: TAKE 1 TABLET BY MOUTH AT BEDTIME AS NEEDED     Psychiatry: Antidepressants - Serotonin Modulator Passed - 08/31/2022  5:09 PM      Passed - Completed PHQ-2 or PHQ-9 in the last 360 days      Passed - Valid encounter within last 6 months    Recent Outpatient Visits           1 month ago Generalized anxiety disorder with panic attacks   [place], DO   1 month ago Generalized anxiety disorder with panic attacks   [name], DO   1 month ago Generalized anxiety disorder with panic attacks   [place], DO   2 months ago Symptomatic anemia   [place], DO   4 months ago Generalized anxiety disorder with panic attacks   [place], [place]

## 2022-09-04 NOTE — Progress Notes (Unsigned)
PROVIDER NOTE: Information contained herein reflects review and annotations entered in association with encounter. Interpretation of such information and data should be left to medically-trained personnel. Information provided to patient can be located elsewhere in the medical record under "Patient Instructions". Document created using STT-dictation technology, any transcriptional errors that may result from process are unintentional.    Patient: Aaron Gibbs  Service Category: E/M  Provider: Gaspar Cola, MD  DOB: 1934-07-20  DOS: 09/06/2022  Referring Provider: [name] *  MRN: [id number]  Specialty: Interventional Pain Management  PCP: Olin Hauser, DO  Type: Established Patient  Setting: Ambulatory outpatient    Location: Office  Delivery: Face-to-face     HPI  [name], a 88 y.o. year old male, is here today because of his No primary diagnosis found.. Mr. [name] primary complain 88today is No chief complaint on file.  Pertinent problems: Mr. [name] has Chronic pain syndrome; and 88 DISH (diffuse idiopathic skeletal hyperostosis); Osteoarthritis of multiple joints; Chronic knee pain (1ry area of Pain) (Bilateral) (L>R); Chronic low back pain (Bilateral) w/o sciatica; Somatic symptom disorder; Tricompartment osteoarthritis of knee (Left); Osteoarthritis of knee (Bilateral); Osteoarthritis of patellofemoral joint (Right); Neurogenic pain; Lateral meniscal tear, sequela (Left); Medial meniscal tear, sequela (Left); Patellar tendinosis (Right); Osteoarthritis of knee (Left); Abnormal MRI, knee (08/05/2018) (Left); Chronic knee pain (Left); Pain in right knee; and Other chronic pain on their pertinent problem list. Pain Assessment: Severity of   is reported as a  /10. Location:    / . Onset:  . Quality:  . Timing:  . Modifying factor(s):  Marland Kitchen Vitals:  vitals were not taken for this visit.  BMI: Estimated body mass index is 31.52 kg/m as calculated from the  following:   Height as of 07/10/22: 5\' 11"  (1.803 m).   Weight as of 07/26/22: 226 lb (102.5 kg). Last encounter: 05/24/2022. Last procedure: 04/18/2022.  Reason for encounter: medication management. ***  RTCB:   Pharmacotherapy Assessment  Analgesic: No opioid analgesics prescribed by our practice.  Buprenorphine 10 mcg/h patch q. 7 days.  High risk for SUD.  History of suicidal attempts and noncompliance with medication intake. ED visits thought to be due to medication binging. MME/day: 0 mg/day.   Monitoring: [place] PMP: PDMP reviewed during this encounter.       Pharmacotherapy: No side-effects or adverse reactions reported. Compliance: No problems identified. Effectiveness: Clinically acceptable.  No notes on file  No results found for: "CBDTHCR" No results found for: "D8THCCBX" No results found for: "D9THCCBX"  UDS:  Summary  Date Value Ref Range Status  01/26/2021 Note  Final    Comment:    ==================================================================== [facility] 13 (MW) ==================================================================== Test                             Result       Flag       Units  Drug Present and Declared for Prescription Verification   Lorazepam                      796          EXPECTED   ng/mg creat    Source of lorazepam is a scheduled prescription medication.    Buprenorphine                  3            EXPECTED   ng/mg creat  Sources of buprenorphine include scheduled prescription medications.  Drug Absent but Declared for Prescription Verification   Tramadol                       Not Detected UNEXPECTED ng/mg creat ==================================================================== Test                      Result    Flag   Units      Ref Range   Creatinine              156              mg/dL      >=20 ==================================================================== Declared Medications:  The flagging and interpretation  on this report are based on the  following declared medications.  Unexpected results may arise from  inaccuracies in the declared medications.   **Note: The testing scope of this panel includes these medications:   Lorazepam (Ativan)  Tramadol (Ultram)   **Note: The testing scope of this panel does not include small to  moderate amounts of these reported medications:   Buprenorphine Patch (BuTrans)   **Note: The testing scope of this panel does not include the  following reported medications:   Acetaminophen (Tylenol)  Albuterol (Ventolin HFA)  Amlodipine (Norvasc)  Azelastine  Diclofenac (Voltaren)  Dorzolamide  Duloxetine (Cymbalta)  Fenofibrate (TriCor)  Fluticasone (Flonase)  Hydroxyzine (Atarax)  Ibuprofen (Advil)  Iron  Montelukast (Singulair)  Multivitamin  Mupirocin (Bactroban)  Pantoprazole (Protonix)  Prednisolone  Quetiapine (Seroquel)  Risperidone (Risperdal)  Tamsulosin (Flomax)  Trazodone (Desyrel) ==================================================================== For clinical consultation, please call [phone]. ====================================================================       ROS  Constitutional: Denies any fever or chills Gastrointestinal: No reported hemesis, hematochezia, vomiting, or acute GI distress Musculoskeletal: Denies any acute onset joint swelling, redness, loss of ROM, or weakness Neurological: No reported episodes of acute onset apraxia, aphasia, dysarthria, agnosia, amnesia, paralysis, loss of coordination, or loss of consciousness  Medication Review  Arnicare, DULoxetine, Iron (Ferrous Sulfate), LORazepam, Melatonin, OLANZapine, Propylene Glycol, acetaminophen, brompheniramine-pseudoephedrine-DM, bumetanide, buprenorphine, carvedilol, cetirizine, clotrimazole-betamethasone, diclofenac Sodium, dorzolamide-timolol, finasteride, gabapentin, latanoprost, losartan, meloxicam, nystatin, pantoprazole, tamsulosin, and  traZODone  History Review  Allergy: [name] is allergic to anthralin and azithromycin. Drug: [name]  reports no history of drug use. Alcohol:  reports that he does not currently use alcohol. Tobacco:  reports that he has quit smoking. His smoking use included cigarettes. His smokeless tobacco use includes chew. Social: Mr. Veale  reports that he has quit smoking. His smoking use included cigarettes. His smokeless tobacco use includes chew. He reports that he does not currently use alcohol. He reports that he does not use drugs. Medical:  has a past medical history of Anxiety, Cancer ([place]), COPD (chronic obstructive pulmonary disease) ([place]), Glaucoma, and Osteoporosis. Surgical: Mr. Goodlow  has a past surgical history that includes Cholecystectomy and Transurethral resection of prostate (N/A, 2008). Family: family history includes Depression in his mother; Heart disease in his father.  Laboratory Chemistry Profile   Renal Lab Results  Component Value Date   BUN 19 06/19/2022   CREATININE 1.17 06/19/2022   LABCREA 631 12/13/2019   BCR 28 (H) 07/10/2018   GFRAA >60 02/09/2020   GFRNONAA >60 06/19/2022    Hepatic Lab Results  Component Value Date   AST 33 06/19/2022   ALT 24 06/19/2022   ALBUMIN 4.0 06/19/2022   ALKPHOS 63 06/19/2022   LIPASE 24 01/22/2022   AMMONIA 25  09/13/2019    Electrolytes Lab Results  Component Value Date   NA 141 06/19/2022   K 3.9 06/19/2022   CL 110 06/19/2022   CALCIUM 9.1 06/19/2022   MG 2.0 01/22/2022   PHOS 3.4 03/30/2019    Bone Lab Results  Component Value Date   VD25OH 26 08/15/2016   25OHVITD1 34 07/10/2018   25OHVITD2 <1.0 07/10/2018   25OHVITD3 34 07/10/2018    Inflammation (CRP: Acute Phase) (ESR: Chronic Phase) Lab Results  Component Value Date   CRP 5 07/10/2018   ESRSEDRATE 20 07/10/2018   LATICACIDVEN 0.9 01/29/2022         Note: Above Lab results reviewed.  Recent Imaging Review  CT ABDOMEN PELVIS W  CONTRAST CLINICAL DATA:  Acute abdominal pain and suspected pulmonary embolism.  EXAM: CT ANGIOGRAPHY CHEST  CT ABDOMEN AND PELVIS WITH CONTRAST  TECHNIQUE: Multidetector CT imaging of the chest was performed using the standard protocol during bolus administration of intravenous contrast. Multiplanar CT image reconstructions and MIPs were obtained to evaluate the vascular anatomy. Multidetector CT imaging of the abdomen and pelvis was performed using the standard protocol during bolus administration of intravenous contrast.  RADIATION DOSE REDUCTION: This exam was performed according to the departmental dose-optimization program which includes automated exposure control, adjustment of the mA and/or kV according to patient size and/or use of iterative reconstruction technique.  CONTRAST:  136mL OMNIPAQUE IOHEXOL 350 MG/ML SOLN  COMPARISON:  November of 2019.  FINDINGS: CTA CHEST FINDINGS  Cardiovascular: Calcified aortic atherosclerotic changes. Tortuous great vessels in the chest, grossly similar to previous imaging, not well assessed currently due to bolus timing, demise for evaluation of pulmonary arterial bed. Heart size normal without signs of pericardial effusion or nodularity.  Central pulmonary vasculature is opacified to 321 Hounsfield units. Studies adequate for evaluation to the central segmental level and is negative for pulmonary embolism  Mediastinum/Nodes: No thoracic inlet lymphadenopathy. No axillary lymphadenopathy. No mediastinal lymphadenopathy. No hilar lymphadenopathy.  Lungs/Pleura: No consolidation. No pleural effusion. Stable mild scarring along the major fissure in the LEFT upper lobe. Mild pleural and parenchymal scarring in the lingula is similar also to previous imaging. No pneumothorax. Large airways are grossly patent.  Musculoskeletal: No chest wall mass. No acute process relative to the bony thorax. No destructive bone lesion. Spinal  degenerative changes. Signs of ankylosing spondylitis with multilevel spinal fusion  Review of the MIP images confirms the above findings.  CT ABDOMEN and PELVIS FINDINGS  Hepatobiliary: No suspicious hepatic lesion or acute hepatic or biliary abnormality. Mild biliary duct distension following cholecystectomy is unchanged. Portal vein is patent.  Pancreas: Moderate pancreatic atrophy without signs of inflammation or visible lesion.  Spleen: Normal.  Adrenals/Urinary Tract:  Adrenal glands are unremarkable. Symmetric renal enhancement. No sign of hydronephrosis. No suspicious renal lesion or perinephric stranding.  Urinary bladder is grossly unremarkable.  Urinary bladder herniates over the pubic symphysis without adjacent stranding. Similar to previous imaging.  Stomach/Bowel: Stomach without signs of adjacent stranding. Under distension limiting assessment. No sign of small bowel obstruction or inflammation. Appendix is normal. No acute colonic process. Diverticulosis of the colon. Diverticular changes are mild-to-moderate.  Vascular/Lymphatic: Infrarenal abdominal aortic aneurysm measuring approximately 4.9 x 4.2 greatest dimension. Aneurysm is tortuous. Previous maximal diameter approximately 4.2 x 3.8 cm. No adjacent stranding or signs of dissection. Peripheral soft plaque slightly increased from previous imaging. No adenopathy in the abdomen.  No adenopathy in the pelvis.  Reproductive: Unremarkable by CT.  Other: Ventral abdominal  wall laxity with suprapubic open "herniation" of the urinary bladder as outlined above. No pneumoperitoneum. No ascites.  Musculoskeletal: Signs of ankylosing spondylitis with ankylosis of the bilateral sacroiliac joints and extensive bony ankylosis of the spine. Osteopenia. No acute bone finding or destructive bone process. Avascular necrosis of femoral heads without collapse  Review of the MIP images confirms the above  findings.  IMPRESSION: 1. No evidence of pulmonary embolism to the central segmental level. 2. No acute findings in the chest, abdomen or pelvis. 3. Infrarenal abdominal aortic aneurysm measuring approximately 4.9 x 4.2 greatest dimension. Previous maximal diameter approximately 4.2 x 3.8 cm in 2019. No adjacent stranding or signs of dissection. Recommend follow-up every 6 months and vascular consultation. 4. Signs of ankylosing spondylitis with multilevel spinal fusion. 5. Aortic atherosclerosis.  Aortic Atherosclerosis (ICD10-I70.0).  Electronically Signed   By: Zetta Bills M.D.   On: 06/16/2022 15:19 CT Angio Chest PE W and/or Wo Contrast CLINICAL DATA:  Acute abdominal pain and suspected pulmonary embolism.  EXAM: CT ANGIOGRAPHY CHEST  CT ABDOMEN AND PELVIS WITH CONTRAST  TECHNIQUE: Multidetector CT imaging of the chest was performed using the standard protocol during bolus administration of intravenous contrast. Multiplanar CT image reconstructions and MIPs were obtained to evaluate the vascular anatomy. Multidetector CT imaging of the abdomen and pelvis was performed using the standard protocol during bolus administration of intravenous contrast.  RADIATION DOSE REDUCTION: This exam was performed according to the departmental dose-optimization program which includes automated exposure control, adjustment of the mA and/or kV according to patient size and/or use of iterative reconstruction technique.  CONTRAST:  162mL OMNIPAQUE IOHEXOL 350 MG/ML SOLN  COMPARISON:  November of 2019.  FINDINGS: CTA CHEST FINDINGS  Cardiovascular: Calcified aortic atherosclerotic changes. Tortuous great vessels in the chest, grossly similar to previous imaging, not well assessed currently due to bolus timing, demise for evaluation of pulmonary arterial bed. Heart size normal without signs of pericardial effusion or nodularity.  Central pulmonary vasculature is opacified to 321  Hounsfield units. Studies adequate for evaluation to the central segmental level and is negative for pulmonary embolism  Mediastinum/Nodes: No thoracic inlet lymphadenopathy. No axillary lymphadenopathy. No mediastinal lymphadenopathy. No hilar lymphadenopathy.  Lungs/Pleura: No consolidation. No pleural effusion. Stable mild scarring along the major fissure in the LEFT upper lobe. Mild pleural and parenchymal scarring in the lingula is similar also to previous imaging. No pneumothorax. Large airways are grossly patent.  Musculoskeletal: No chest wall mass. No acute process relative to the bony thorax. No destructive bone lesion. Spinal degenerative changes. Signs of ankylosing spondylitis with multilevel spinal fusion  Review of the MIP images confirms the above findings.  CT ABDOMEN and PELVIS FINDINGS  Hepatobiliary: No suspicious hepatic lesion or acute hepatic or biliary abnormality. Mild biliary duct distension following cholecystectomy is unchanged. Portal vein is patent.  Pancreas: Moderate pancreatic atrophy without signs of inflammation or visible lesion.  Spleen: Normal.  Adrenals/Urinary Tract:  Adrenal glands are unremarkable. Symmetric renal enhancement. No sign of hydronephrosis. No suspicious renal lesion or perinephric stranding.  Urinary bladder is grossly unremarkable.  Urinary bladder herniates over the pubic symphysis without adjacent stranding. Similar to previous imaging.  Stomach/Bowel: Stomach without signs of adjacent stranding. Under distension limiting assessment. No sign of small bowel obstruction or inflammation. Appendix is normal. No acute colonic process. Diverticulosis of the colon. Diverticular changes are mild-to-moderate.  Vascular/Lymphatic: Infrarenal abdominal aortic aneurysm measuring approximately 4.9 x 4.2 greatest dimension. Aneurysm is tortuous. Previous maximal diameter approximately 4.2 x  3.8 cm. No adjacent stranding  or signs of dissection. Peripheral soft plaque slightly increased from previous imaging. No adenopathy in the abdomen.  No adenopathy in the pelvis.  Reproductive: Unremarkable by CT.  Other: Ventral abdominal wall laxity with suprapubic open "herniation" of the urinary bladder as outlined above. No pneumoperitoneum. No ascites.  Musculoskeletal: Signs of ankylosing spondylitis with ankylosis of the bilateral sacroiliac joints and extensive bony ankylosis of the spine. Osteopenia. No acute bone finding or destructive bone process. Avascular necrosis of femoral heads without collapse  Review of the MIP images confirms the above findings.  IMPRESSION: 1. No evidence of pulmonary embolism to the central segmental level. 2. No acute findings in the chest, abdomen or pelvis. 3. Infrarenal abdominal aortic aneurysm measuring approximately 4.9 x 4.2 greatest dimension. Previous maximal diameter approximately 4.2 x 3.8 cm in 2019. No adjacent stranding or signs of dissection. Recommend follow-up every 6 months and vascular consultation. 4. Signs of ankylosing spondylitis with multilevel spinal fusion. 5. Aortic atherosclerosis.  Aortic Atherosclerosis (ICD10-I70.0).  Electronically Signed   By: Zetta Bills M.D.   On: 06/16/2022 15:19 DG Chest 2 View CLINICAL DATA:  Shortness of breath and chills  EXAM: CHEST - 2 VIEW  COMPARISON:  Chest radiograph 05/27/2022  FINDINGS: The cardiomediastinal silhouette is stable and within normal limits.  A portion of the right lung apex is obscured the patient's head. Within this confine, there is no focal consolidation or pulmonary edema. There is no pleural effusion or pneumothorax.  There is no acute osseous abnormality. Fusion across the thoracic vertebral bodies is again seen.  IMPRESSION: Stable chest with no radiographic evidence of acute cardiopulmonary process.  Electronically Signed   By: Valetta Mole M.D.   On:  06/16/2022 09:42 Note: Reviewed        Physical Exam  General appearance: Well nourished, well developed, and well hydrated. In no apparent acute distress Mental status: Alert, oriented x 3 (person, place, & time)       Respiratory: No evidence of acute respiratory distress Eyes: PERLA Vitals: There were no vitals taken for this visit. BMI: Estimated body mass index is 31.52 kg/m as calculated from the following:   Height as of 07/10/22: 5\' 11"  (1.803 m).   Weight as of 07/26/22: 226 lb (102.5 kg). Ideal: Patient weight not recorded  Assessment   Diagnosis Status  No diagnosis found. Controlled Controlled Controlled   Updated Problems: No problems updated.  Plan of Care  Problem-specific:  No problem-specific Assessment & Plan notes found for this encounter.  Mr. Caiden Kolden has a current medication list which includes the following long-term medication(s): bumetanide, buprenorphine, buprenorphine, buprenorphine, buprenorphine, carvedilol, cetirizine, diclofenac sodium, duloxetine, gabapentin, iron (ferrous sulfate), losartan, olanzapine, pantoprazole, and trazodone.  Pharmacotherapy (Medications Ordered): No orders of the defined types were placed in this encounter.  Orders:  No orders of the defined types were placed in this encounter.  Follow-up plan:   No follow-ups on file.      Interventional Therapies  Risk Factors  Considerations:  Patient is legally blind. High risk for substance use disorder.  The patient has documented suicidal attempt using medications.  In addition, we have documented the patient to have a binge use pattern and been noncompliant with the instructions on how to use the medications.  This is a reason why went to a buprenorphine patch.   Planned  Pending:   Repeat bilateral genicular nerve RFA #3    Under consideration:   Palliative bilateral  genicular nerve RFA maintenance treatments   Completed:   Palliative bilateral IA Hyalgan  knee injections Sx2  Diagnostic bilateral genicular NB x1  Palliative right genicular nerve RFA x2 (04/06/2020) (100/100/50/100)  Palliative left genicular nerve RFA x3 (09/28/2020) (100/50/50/90)    Completed by other providers:   N/A   Therapeutic  Palliative (PRN) options:   Palliative bilateral IA Hyalgan knee injections S3/N1  Diagnostic bilateral genicular NB #2  Palliative bilateral genicular nerve RFA #3    Pharmacotherapy  Nonopioid transferred 11/02/2020: Voltaren gel        Recent Visits No visits were found meeting these conditions. Showing recent visits within past 90 days and meeting all other requirements Future Appointments Date Type Provider Dept  09/06/22 Appointment Milinda Pointer, MD [facility]  Showing future appointments within next 90 days and meeting all other requirements  I discussed the assessment and treatment plan with the patient. The patient was provided an opportunity to ask questions and all were answered. The patient agreed with the plan and demonstrated an understanding of the instructions.  Patient advised to call back or seek an in-person evaluation if the symptoms or condition worsens.  Duration of encounter: *** minutes.  Total time on encounter, as per AMA guidelines included both the face-to-face and non-face-to-face time personally spent by the physician and/or other qualified health care professional(s) on the day of the encounter (includes time in activities that require the physician or other qualified health care professional and does not include time in activities normally performed by clinical staff). Physician's time may include the following activities when performed: Preparing to see the patient (e.g., pre-charting review of records, searching for previously ordered imaging, lab work, and nerve conduction tests) Review of prior analgesic pharmacotherapies. Reviewing PMP Interpreting ordered tests (e.g., lab work,  imaging, nerve conduction tests) Performing post-procedure evaluations, including interpretation of diagnostic procedures Obtaining and/or reviewing separately obtained history Performing a medically appropriate examination and/or evaluation Counseling and educating the patient/family/caregiver Ordering medications, tests, or procedures Referring and communicating with other health care professionals (when not separately reported) Documenting clinical information in the electronic or other health record Independently interpreting results (not separately reported) and communicating results to the patient/ family/caregiver Care coordination (not separately reported)  Note by: Gaspar Cola, MD Date: 09/06/2022; Time: 3:01 PM

## 2022-09-04 NOTE — Patient Instructions (Signed)
____________________________________________________________________________________________  Opioid Pain Medication Update  To: All patients taking opioid pain medications. (I.e.: hydrocodone, hydromorphone, oxycodone, oxymorphone, morphine, codeine, methadone, tapentadol, tramadol, buprenorphine, fentanyl, etc.)  Re: Updated review of side effects and adverse reactions of opioid analgesics, as well as new information about long term effects of this class of medications.  Direct risks of long-term opioid therapy are not limited to opioid addiction and overdose. Potential medical risks include serious fractures, breathing problems during sleep, hyperalgesia, immunosuppression, chronic constipation, bowel obstruction, myocardial infarction, and tooth decay secondary to xerostomia.  Unpredictable adverse effects that can occur even if you take your medication correctly: Cognitive impairment, respiratory depression, and death. Most people think that if they take their medication "correctly", and "as instructed", that they will be safe. Nothing could be farther from the truth. In reality, a significant amount of recorded deaths associated with the use of opioids has occurred in individuals that had taken the medication for a long time, and were taking their medication correctly. The following are examples of how this can happen: Patient taking his/her medication for a long time, as instructed, without any side effects, is given a certain antibiotic or another unrelated medication, which in turn triggers a "Drug-to-drug interaction" leading to disorientation, cognitive impairment, impaired reflexes, respiratory depression or an untoward event leading to serious bodily harm or injury, including death.  Patient taking his/her medication for a long time, as instructed, without any side effects, develops an acute impairment of liver and/or kidney function. This will lead to a rapid inability of the body to  breakdown and eliminate their pain medication, which will result in effects similar to an "overdose", but with the same medicine and dose that they had always taken. This again may lead to disorientation, cognitive impairment, impaired reflexes, respiratory depression or an untoward event leading to serious bodily harm or injury, including death.  A similar problem will occur with patients as they grow older and their liver and kidney function begins to decrease as part of the aging process.  Background information: Historically, the original case for using long-term opioid therapy to treat chronic noncancer pain was based on safety assumptions that subsequent experience has called into question. In 1996, the American Pain Society and the American Academy of Pain Medicine issued a consensus statement supporting long-term opioid therapy. This statement acknowledged the dangers of opioid prescribing but concluded that the risk for addiction was low; respiratory depression induced by opioids was short-lived, occurred mainly in opioid-naive patients, and was antagonized by pain; tolerance was not a common problem; and efforts to control diversion should not constrain opioid prescribing. This has now proven to be wrong. Experience regarding the risks for opioid addiction, misuse, and overdose in community practice has failed to support these assumptions.  According to the Centers for Disease Control and Prevention, fatal overdoses involving opioid analgesics have increased sharply over the past decade. Currently, more than 96,700 people die from drug overdoses every year. Opioids are a factor in 7 out of every 10 overdose deaths. Deaths from drug overdose have surpassed motor vehicle accidents as the leading cause of death for individuals between the ages of 35 and 54.  Clinical data suggest that neuroendocrine dysfunction may be very common in both men and women, potentially causing hypogonadism, erectile  dysfunction, infertility, decreased libido, osteoporosis, and depression. Recent studies linked higher opioid dose to increased opioid-related mortality. Controlled observational studies reported that long-term opioid therapy may be associated with increased risk for cardiovascular events. Subsequent meta-analysis concluded   that the safety of long-term opioid therapy in elderly patients has not been proven.   Side Effects and adverse reactions: Common side effects: Drowsiness (sedation). Dizziness. Nausea and vomiting. Constipation. Physical dependence -- Dependence often manifests with withdrawal symptoms when opioids are discontinued or decreased. Tolerance -- As you take repeated doses of opioids, you require increased medication to experience the same effect of pain relief. Respiratory depression -- This can occur in healthy people, especially with higher doses. However, people with COPD, asthma or other lung conditions may be even more susceptible to fatal respiratory impairment.  Uncommon side effects: An increased sensitivity to feeling pain and extreme response to pain (hyperalgesia). Chronic use of opioids can lead to this. Delayed gastric emptying (the process by which the contents of your stomach are moved into your small intestine). Muscle rigidity. Immune system and hormonal dysfunction. Quick, involuntary muscle jerks (myoclonus). Arrhythmia. Itchy skin (pruritus). Dry mouth (xerostomia).  Long-term side effects: Chronic constipation. Sleep-disordered breathing (SDB). Increased risk of bone fractures. Hypothalamic-pituitary-adrenal dysregulation. Increased risk of overdose.  RISKS: Fractures and Falls:  Opioids increase the risk and incidence of falls. This is of particular importance in elderly patients.  Endocrine System:  Long-term administration is associated with endocrine abnormalities (endocrinopathies). (Also known as Opioid-induced Endocrinopathy) Influences  on both the hypothalamic-pituitary-adrenal axis?and the hypothalamic-pituitary-gonadal axis have been demonstrated with consequent hypogonadism and adrenal insufficiency in both sexes. Hypogonadism and decreased levels of dehydroepiandrosterone sulfate have been reported in men and women. Endocrine effects include: Amenorrhoea in women (abnormal absence of menstruation) Reduced libido in both sexes Decreased sexual function Erectile dysfunction in men Hypogonadisms (decreased testicular function with shrinkage of testicles) Infertility Depression and fatigue Loss of muscle mass Anxiety Depression Immune suppression Hyperalgesia Weight gain Anemia Osteoporosis Patients (particularly women of childbearing age) should avoid opioids. There is insufficient evidence to recommend routine monitoring of asymptomatic patients taking opioids in the long-term for hormonal deficiencies.  Immune System: Human studies have demonstrated that opioids have an immunomodulating effect. These effects are mediated via opioid receptors both on immune effector cells and in the central nervous system. Opioids have been demonstrated to have adverse effects on antimicrobial response and anti-tumour surveillance. Buprenorphine has been demonstrated to have no impact on immune function.  Opioid Induced Hyperalgesia: Human studies have demonstrated that prolonged use of opioids can lead to a state of abnormal pain sensitivity, sometimes called opioid induced hyperalgesia (OIH). Opioid induced hyperalgesia is not usually seen in the absence of tolerance to opioid analgesia. Clinically, hyperalgesia may be diagnosed if the patient on long-term opioid therapy presents with increased pain. This might be qualitatively and anatomically distinct from pain related to disease progression or to breakthrough pain resulting from development of opioid tolerance. Pain associated with hyperalgesia tends to be more diffuse than the  pre-existing pain and less defined in quality. Management of opioid induced hyperalgesia requires opioid dose reduction.  Cancer: Chronic opioid therapy has been associated with an increased risk of cancer among noncancer patients with chronic pain. This association was more evident in chronic strong opioid users. Chronic opioid consumption causes significant pathological changes in the small intestine and colon. Epidemiological studies have found that there is a link between opium dependence and initiation of gastrointestinal cancers. Cancer is the second leading cause of death after cardiovascular disease. Chronic use of opioids can cause multiple conditions such as GERD, immunosuppression and renal damage as well as carcinogenic effects, which are associated with the incidence of cancers.   Mortality: Long-term opioid use   has been associated with increased mortality among patients with chronic non-cancer pain (CNCP).  Prescription of long-acting opioids for chronic noncancer pain was associated with a significantly increased risk of all-cause mortality, including deaths from causes other than overdose.  Reference: Von Korff M, Kolodny A, Deyo RA, Chou R. Long-term opioid therapy reconsidered. Ann Intern Med. 2011 Sep 6;155(5):325-8. doi: 10.7326/[id number]. PMID: 21893626; PMCID: PMC3280085. Bedson J, Chen Y, Ashworth J, Hayward RA, Dunn KM, Jordan KP. Risk of adverse events in patients prescribed long-term opioids: A cohort study in the [place] Clinical Practice Research Datalink. Eur J Pain. 2019 May;23(5):908-922. doi: 10.1002/ejp.1357. Epub 2019 Jan 31. PMID: 30620116. Colameco S, Coren JS, Ciervo CA. Continuous opioid treatment for chronic noncancer pain: a time for moderation in prescribing. Postgrad Med. 2009 Jul;121(4):61-6. doi: 10.3810/pgm.2009.07.2032. PMID: 19641271. Chou R, Turner JA, Devine EB, Hansen RN, Sullivan SD, Blazina I, Dana T, Bougatsos C, Deyo RA. The  effectiveness and risks of long-term opioid therapy for chronic pain: a systematic review for a National Institutes of Health Pathways to Prevention Workshop. Ann Intern Med. 2015 Feb 17;162(4):276-86. doi: 10.7326/M14-2559. PMID: 25581257. Warner M, Chen LH, Makuc DM. NCHS Data Brief No. 22. [place]: Centers for Disease Control and Prevention; 2009. Sep, Increase in Fatal Poisonings Involving Opioid Analgesics in the [place], 1999-2006. Song IA, Choi HR, Oh TK. Long-term opioid use and mortality in patients with chronic non-cancer pain: Ten-year follow-up study in [place] from 2010 through 2019. EClinicalMedicine. 2022 Jul 18;51:101558. doi: 10.1016/j.eclinm.2022.101558. PMID: 35875817; PMCID: PMC9304910. Huser, W., Schubert, T., Vogelmann, T. et al. All-cause mortality in patients with long-term opioid therapy compared with non-opioid analgesics for chronic non-cancer pain: a database study. BMC Med 18, 162 (2020). https://doi.org/10.1186/[email or web address] Rashidian H, Zendehdel K, Kamangar F, Malekzadeh R, Haghdoost AA. An Ecological Study of the Association between Opiate Use and Incidence of Cancers. Addict Health. 2016 Fall;8(4):252-260. PMID: 28819556; PMCID: PMC5554805.  Our Goal: Our goal is to control your pain with means other than the use of opioid pain medications.  Our Recommendation: Talk to your physician about coming off of these medications. We can assist you with the tapering down and stopping these medicines. Based on the new information, even if you cannot completely stop the medication, a decrease in the dose may be associated with a lesser risk. Ask for other means of controlling the pain. Decrease or eliminate those factors that significantly contribute to your pain such as smoking, obesity, and a diet heavily tilted towards "inflammatory" nutrients.  Last Updated: 08/16/2022    ____________________________________________________________________________________________     ____________________________________________________________________________________________  Patient Information update  To: All of our patients.  Re: Name change.  It has been made official that our current name, "[facility]"   will soon be changed to "[facility]".   The purpose of this change is to eliminate any confusion created by the concept of our practice being a "Medication Management Pain Clinic". In the past this has led to the misconception that we treat pain primarily by the use of prescription medications.  Nothing can be farther from the truth.   Understanding PAIN MANAGEMENT: To further understand what our practice does, you first have to understand that "Pain Management" is a subspecialty that requires additional training once a physician has completed their specialty training, which can be in either Anesthesia, Neurology, Psychiatry, or Physical Medicine and Rehabilitation (PMR). Each one of these contributes to the final approach taken by each physician to   the management of their patient's pain. To be a "Pain Management Specialist" you must have first completed one of the specialty trainings below.  Anesthesiologists - trained in clinical pharmacology and interventional techniques such as nerve blockade and regional as well as central neuroanatomy. They are trained to block pain before, during, and after surgical interventions.  Neurologists - trained in the diagnosis and pharmacological treatment of complex neurological conditions, such as Multiple Sclerosis, Parkinson's, spinal cord injuries, and other systemic conditions that may be associated with symptoms that may include but are not limited to pain. They tend to rely primarily on the treatment of chronic pain  using prescription medications.  Psychiatrist - trained in conditions affecting the psychosocial wellbeing of patients including but not limited to depression, anxiety, schizophrenia, personality disorders, addiction, and other substance use disorders that may be associated with chronic pain. They tend to rely primarily on the treatment of chronic pain using prescription medications.   Physical Medicine and Rehabilitation (PMR) physicians, also known as physiatrists - trained to treat a wide variety of medical conditions affecting the brain, spinal cord, nerves, bones, joints, ligaments, muscles, and tendons. Their training is primarily aimed at treating patients that have suffered injuries that have caused severe physical impairment. Their training is primarily aimed at the physical therapy and rehabilitation of those patients. They may also work alongside orthopedic surgeons or neurosurgeons using their expertise in assisting surgical patients to recover after their surgeries.  INTERVENTIONAL PAIN MANAGEMENT is sub-subspecialty of Pain Management.  Our physicians are Board-certified in Anesthesia, Pain Management, and Interventional Pain Management.  This meaning that not only have they been trained and Board-certified in their specialty of Anesthesia, and subspecialty of Pain Management, but they have also received further training in the sub-subspecialty of Interventional Pain Management, in order to become Board-certified as INTERVENTIONAL PAIN MANAGEMENT SPECIALIST.    Mission: Our goal is to use our skills in  INTERVENTIONAL PAIN MANAGEMENT as alternatives to the chronic use of prescription opioid medications for the treatment of pain. To make this more clear, we have changed our name to reflect what we do and offer. We will continue to offer medication management assessment and recommendations, but we will not be taking over any patient's medication  management.  ____________________________________________________________________________________________     ____________________________________________________________________________________________  National Pain Medication Shortage  The [place] is experiencing worsening drug shortages. These have had a negative widespread effect on patient care and treatment. Not expected to improve any time soon. Predicted to last past 2029.   Drug shortage list (generic names) Oxycodone IR Oxycodone/APAP Oxymorphone IR Hydromorphone Hydrocodone/APAP Morphine  Where is the problem?  Manufacturing and supply level.  Will this shortage affect you?  Only if you take any of the above pain medications.  How? You may be unable to fill your prescription.  Your pharmacist may offer a "partial fill" of your prescription. (Warning: Do not accept partial fills.) Prescriptions partially filled cannot be transferred to another pharmacy. Read our Medication Rules and Regulation. Depending on how much medicine you are dependent on, you may experience withdrawals when unable to get the medication.  Recommendations: Consider ending your dependence on opioid pain medications. Ask your pain specialist to assist you with the process. Consider switching to a medication currently not in shortage, such as Buprenorphine. Talk to your pain specialist about this option. Consider decreasing your pain medication requirements by managing tolerance thru "Drug Holidays". This may help minimize withdrawals, should you run out of medicine. Control your pain thru   the use of non-pharmacological interventional therapies.   Your prescriber: Prescribers cannot be blamed for shortages. Medication manufacturing and supply issues cannot be fixed by the prescriber.   NOTE: The prescriber is not responsible for supplying the medication, or solving supply issues. Work with your pharmacist to solve it. The patient is responsible for  the decision to take or continue taking the medication and for identifying and securing a legal supply source. By law, supplying the medication is the job and responsibility of the pharmacy. The prescriber is responsible for the evaluation, monitoring, and prescribing of these medications.   Prescribers will NOT: Re-issue prescriptions that have been partially filled. Re-issue prescriptions already sent to a pharmacy.  Re-send prescriptions to a different pharmacy because yours did not have your medication. Ask pharmacist to order more medicine or transfer the prescription to another pharmacy. (Read below.)  New 2023 regulation: "February 17, 2022 Revised Regulation Allows DEA-Registered Pharmacies to Transfer Electronic Prescriptions at a Patient's Request DEA Headquarters Division - Public Information Office Patients now have the ability to request their electronic prescription be transferred to another pharmacy without having to go back to their practitioner to initiate the request. This revised regulation went into effect on Monday, February 13, 2022.     At a patient's request, a DEA-registered retail pharmacy can now transfer an electronic prescription for a controlled substance (schedules II-V) to another DEA-registered retail pharmacy. Prior to this change, patients would have to go through their practitioner to cancel their prescription and have it re-issued to a different pharmacy. The process was taxing and time consuming for both patients and practitioners.    The Drug Enforcement Administration (DEA) published its intent to revise the process for transferring electronic prescriptions on May 07, 2020.  The final rule was published in the federal register on January 12, 2022 and went into effect 30 days later.  Under the final rule, a prescription can only be transferred once between pharmacies, and only if allowed under existing state or other applicable law. The prescription must  remain in its electronic form; may not be altered in any way; and the transfer must be communicated directly between two licensed pharmacists. It's important to note, any authorized refills transfer with the original prescription, which means the entire prescription will be filled at the same pharmacy".  Reference: https://www.dea.gov/stories/2023/2023-02/2022-09-01/revised-regulation-allows-dea-registered-pharmacies-transfer (DEA website announcement)  https://www.govinfo.gov/content/pkg/FR-2022-01-12/pdf/2023-15847.pdf (Federal Register  Department of Justice)   Federal Register / Vol. 88, No. 143 / Thursday, January 12, 2022 / Rules and Regulations DEPARTMENT OF JUSTICE  Drug Enforcement Administration  21 CFR Part 1306  [Docket No. [id number]]  RIN [id number]-AB64 Transfer of Electronic Prescriptions for Schedules II-V Controlled Substances Between Pharmacies for Initial Filling  ____________________________________________________________________________________________     ____________________________________________________________________________________________  Transfer of Pain Medication between Pharmacies  Re: 2023 DEA Clarification on existing regulation  Published on DEA Website: February 17, 2022  Title: Revised Regulation Allows DEA-Registered Pharmacies to Transfer Electronic Prescriptions at a Patient's Request DEA Headquarters Division - Public Information Office  "Patients now have the ability to request their electronic prescription be transferred to another pharmacy without having to go back to their practitioner to initiate the request. This revised regulation went into effect on Monday, February 13, 2022.     At a patient's request, a DEA-registered retail pharmacy can now transfer an electronic prescription for a controlled substance (schedules II-V) to another DEA-registered retail pharmacy. Prior to this change, patients would have to go through their practitioner to  cancel their prescription   and have it re-issued to a different pharmacy. The process was taxing and time consuming for both patients and practitioners.    The Drug Enforcement Administration (DEA) published its intent to revise the process for transferring electronic prescriptions on May 07, 2020.  The final rule was published in the federal register on January 12, 2022 and went into effect 30 days later.  Under the final rule, a prescription can only be transferred once between pharmacies, and only if allowed under existing state or other applicable law. The prescription must remain in its electronic form; may not be altered in any way; and the transfer must be communicated directly between two licensed pharmacists. It's important to note, any authorized refills transfer with the original prescription, which means the entire prescription will be filled at the same pharmacy."    REFERENCES: 1. DEA website announcement https://www.dea.gov/stories/2023/2023-02/2022-09-01/revised-regulation-allows-dea-registered-pharmacies-transfer  2. Department of Justice website  https://www.govinfo.gov/content/pkg/FR-2022-01-12/pdf/2023-15847.pdf  3. DEPARTMENT OF JUSTICE Drug Enforcement Administration 21 CFR Part 1306 [Docket No. [id number]] RIN [id number] "Transfer of Electronic Prescriptions for Schedules II-V Controlled Substances Between Pharmacies for Initial Filling"  ____________________________________________________________________________________________     _______________________________________________________________________  Medication Rules  Purpose: To inform patients, and their family members, of our medication rules and regulations.  Applies to: All patients receiving prescriptions from our practice (written or electronic).  Pharmacy of record: This is the pharmacy where your electronic prescriptions will be sent. Make sure we have the correct one.  Electronic prescriptions: In  compliance with the [place] Strengthen Opioid Misuse Prevention (STOP) Act of 2017 (Session Law 2017-74/H243), effective June 19, 2018, all controlled substances must be electronically prescribed. Written prescriptions, faxing, or calling prescriptions to a pharmacy will no longer be done.  Prescription refills: These will be provided only during in-person appointments. No medications will be renewed without a "face-to-face" evaluation with your provider. Applies to all prescriptions.  NOTE: The following applies primarily to controlled substances (Opioid* Pain Medications).   Type of encounter (visit): For patients receiving controlled substances, face-to-face visits are required. (Not an option and not up to the patient.)  Patient's responsibilities: Pain Pills: Bring all pain pills to every appointment (except for procedure appointments). Pill Bottles: Bring pills in original pharmacy bottle. Bring bottle, even if empty. Always bring the bottle of the most recent fill.  Medication refills: You are responsible for knowing and keeping track of what medications you are taking and when is it that you will need a refill. The day before your appointment: write a list of all prescriptions that need to be refilled. The day of the appointment: give the list to the admitting nurse. Prescriptions will be written only during appointments. No prescriptions will be written on procedure days. If you forget a medication: it will not be "Called in", "Faxed", or "electronically sent". You will need to get another appointment to get these prescribed. No early refills. Do not call asking to have your prescription filled early. Partial  or short prescriptions: Occasionally your pharmacy may not have enough pills to fill your prescription.  NEVER ACCEPT a partial fill or a prescription that is short of the total amount of pills that you were prescribed.  With controlled substances the law allows 72 hours for  the pharmacy to complete the prescription.  If the prescription is not completed within 72 hours, the pharmacist will require a new prescription to be written. This means that you will be short on your medicine and we WILL NOT send another prescription to complete your original   prescription.  Instead, request the pharmacy to send a carrier to a nearby branch to get enough medication to provide you with your full prescription. Prescription Accuracy: You are responsible for carefully inspecting your prescriptions before leaving our office. Have the discharge nurse carefully go over each prescription with you, before taking them home. Make sure that your name is accurately spelled, that your address is correct. Check the name and dose of your medication to make sure it is accurate. Check the number of pills, and the written instructions to make sure they are clear and accurate. Make sure that you are given enough medication to last until your next medication refill appointment. Taking Medication: Take medication as prescribed. When it comes to controlled substances, taking less pills or less frequently than prescribed is permitted and encouraged. Never take more pills than instructed. Never take the medication more frequently than prescribed.  Inform other Doctors: Always inform, all of your healthcare providers, of all the medications you take. Pain Medication from other Providers: You are not allowed to accept any additional pain medication from any other Doctor or Healthcare provider. There are two exceptions to this rule. (see below) In the event that you require additional pain medication, you are responsible for notifying us, as stated below. Cough Medicine: Often these contain an opioid, such as codeine or hydrocodone. Never accept or take cough medicine containing these opioids if you are already taking an opioid* medication. The combination may cause respiratory failure and death. Medication Agreement:  You are responsible for carefully reading and following our Medication Agreement. This must be signed before receiving any prescriptions from our practice. Safely store a copy of your signed Agreement. Violations to the Agreement will result in no further prescriptions. (Additional copies of our Medication Agreement are available upon request.) Laws, Rules, & Regulations: All patients are expected to follow all Federal and State Laws, Statutes, Rules, & Regulations. Ignorance of the Laws does not constitute a valid excuse.  Illegal drugs and Controlled Substances: The use of illegal substances (including, but not limited to marijuana and its derivatives) and/or the illegal use of any controlled substances is strictly prohibited. Violation of this rule may result in the immediate and permanent discontinuation of any and all prescriptions being written by our practice. The use of any illegal substances is prohibited. Adopted CDC guidelines & recommendations: Target dosing levels will be at or below 60 MME/day. Use of benzodiazepines** is not recommended.  Exceptions: There are only two exceptions to the rule of not receiving pain medications from other Healthcare Providers. Exception #1 (Emergencies): In the event of an emergency (i.e.: accident requiring emergency care), you are allowed to receive additional pain medication. However, you are responsible for: As soon as you are able, call our office [phone], at any time of the day or night, and leave a message stating your name, the date and nature of the emergency, and the name and dose of the medication prescribed. In the event that your call is answered by a member of our staff, make sure to document and save the date, time, and the name of the person that took your information.  Exception #2 (Planned Surgery): In the event that you are scheduled by another doctor or dentist to have any type of surgery or procedure, you are allowed (for a period no  longer than 30 days), to receive additional pain medication, for the acute post-op pain. However, in this case, you are responsible for picking up a copy of   our "Post-op Pain Management for Surgeons" handout, and giving it to your surgeon or dentist. This document is available at our office, and does not require an appointment to obtain it. Simply go to our office during business hours (Monday-Thursday from 8:00 AM to 4:00 PM) (Friday 8:00 AM to 12:00 Noon) or if you have a scheduled appointment with us, prior to your surgery, and ask for it by name. In addition, you are responsible for: calling our office [phone], at any time of the day or night, and leaving a message stating your name, name of your surgeon, type of surgery, and date of procedure or surgery. Failure to comply with your responsibilities may result in termination of therapy involving the controlled substances. Medication Agreement Violation. Following the above rules, including your responsibilities will help you in avoiding a Medication Agreement Violation ("Breaking your Pain Medication Contract").  Consequences:  Not following the above rules may result in permanent discontinuation of medication prescription therapy.  *Opioid medications include: morphine, codeine, oxycodone, oxymorphone, hydrocodone, hydromorphone, meperidine, tramadol, tapentadol, buprenorphine, fentanyl, methadone. **Benzodiazepine medications include: diazepam (Valium), alprazolam (Xanax), clonazepam (Klonopine), lorazepam (Ativan), clorazepate (Tranxene), chlordiazepoxide (Librium), estazolam (Prosom), oxazepam (Serax), temazepam (Restoril), triazolam (Halcion) (Last updated: 04/11/2022) ______________________________________________________________________    ______________________________________________________________________  Medication Recommendations and Reminders  Applies to: All patients receiving prescriptions (written and/or  electronic).  Medication Rules & Regulations: You are responsible for reading, knowing, and following our "Medication Rules" document. These exist for your safety and that of others. They are not flexible and neither are we. Dismissing or ignoring them is an act of "non-compliance" that may result in complete and irreversible termination of such medication therapy. For safety reasons, "non-compliance" will not be tolerated. As with the [place] fundamental legal principle of "ignorance of the law is no defense", we will accept no excuses for not having read and knowing the content of documents provided to you by our practice.  Pharmacy of record:  Definition: This is the pharmacy where your electronic prescriptions will be sent.  We do not endorse any particular pharmacy. It is up to you and your insurance to decide what pharmacy to use.  We do not restrict you in your choice of pharmacy. However, once we write for your prescriptions, we will NOT be re-sending more prescriptions to fix restricted supply problems created by your pharmacy, or your insurance.  The pharmacy listed in the electronic medical record should be the one where you want electronic prescriptions to be sent. If you choose to change pharmacy, simply notify our nursing staff. Changes will be made only during your regular appointments and not over the phone.  Recommendations: Keep all of your pain medications in a safe place, under lock and key, even if you live alone. We will NOT replace lost, stolen, or damaged medication. We do not accept "Police Reports" as proof of medications having been stolen. After you fill your prescription, take 1 week's worth of pills and put them away in a safe place. You should keep a separate, properly labeled bottle for this purpose. The remainder should be kept in the original bottle. Use this as your primary supply, until it runs out. Once it's gone, then you know that you have 1 week's worth of medicine,  and it is time to come in for a prescription refill. If you do this correctly, it is unlikely that you will ever run out of medicine. To make sure that the above recommendation works, it is very important that you make   sure your medication refill appointments are scheduled at least 1 week before you run out of medicine. To do this in an effective manner, make sure that you do not leave the office without scheduling your next medication management appointment. Always ask the nursing staff to show you in your prescription , when your medication will be running out. Then arrange for the receptionist to get you a return appointment, at least 7 days before you run out of medicine. Do not wait until you have 1 or 2 pills left, to come in. This is very poor planning and does not take into consideration that we may need to cancel appointments due to bad weather, sickness, or emergencies affecting our staff. DO NOT ACCEPT A "Partial Fill": If for any reason your pharmacy does not have enough pills/tablets to completely fill or refill your prescription, do not allow for a "partial fill". The law allows the pharmacy to complete that prescription within 72 hours, without requiring a new prescription. If they do not fill the rest of your prescription within those 72 hours, you will need a separate prescription to fill the remaining amount, which we will NOT provide. If the reason for the partial fill is your insurance, you will need to talk to the pharmacist about payment alternatives for the remaining tablets, but again, DO NOT ACCEPT A PARTIAL FILL, unless you can trust your pharmacist to obtain the remainder of the pills within 72 hours.  Prescription refills and/or changes in medication(s):  Prescription refills, and/or changes in dose or medication, will be conducted only during scheduled medication management appointments. (Applies to both, written and electronic prescriptions.) No refills on procedure days. No  medication will be changed or started on procedure days. No changes, adjustments, and/or refills will be conducted on a procedure day. Doing so will interfere with the diagnostic portion of the procedure. No phone refills. No medications will be "called into the pharmacy". No Fax refills. No weekend refills. No Holliday refills. No after hours refills.  Remember:  Business hours are:  Monday to Thursday 8:00 AM to 4:00 PM Provider's Schedule: Nestor Wieneke, MD - Appointments are:  Medication management: Monday and Wednesday 8:00 AM to 4:00 PM Procedure day: Tuesday and Thursday 7:30 AM to 4:00 PM Bilal Lateef, MD - Appointments are:  Medication management: Tuesday and Thursday 8:00 AM to 4:00 PM Procedure day: Monday and Wednesday 7:30 AM to 4:00 PM (Last update: 04/11/2022) ______________________________________________________________________    ____________________________________________________________________________________________  Drug Holidays  What is a "Drug Holiday"? Drug Holiday: is the name given to the process of slowly tapering down and temporarily stopping the pain medication for the purpose of decreasing or eliminating tolerance to the drug.  Benefits Improved effectiveness Decreased required effective dose Improved pain control End dependence on high dose therapy Decrease cost of therapy Uncovering "opioid-induced hyperalgesia". (OIH)  What is "opioid hyperalgesia"? It is a paradoxical increase in pain caused by exposure to opioids. Stopping the opioid pain medication, contrary to the expected, it actually decreases or completely eliminates the pain. Ref.: "A comprehensive review of opioid-induced hyperalgesia". Marion Lee, et.al. Pain Physician. 2011 Mar-Apr;14(2):145-61.  What is tolerance? Tolerance: the progressive loss of effectiveness of a pain medicine due to repetitive use. A common problem of opioid pain medications.  How long should a "Drug  Holiday" last? Effectiveness depends on the patient staying off all opioid pain medicines for a minimum of 14 consecutive days. (2 weeks)  How about just taking less of the medicine? Does not   work. Will not accomplish goal of eliminating the excess receptors.  How about switching to a different pain medicine? (AKA. "Opioid rotation") Does not work. Creates the illusion of effectiveness by taking advantage of inaccurate equivalent dose calculations between different opioids. -This "technique" was promoted by studies funded by pharmaceutical companies, such as [employer], creators of "OxyContin".  Can I stop the medicine "cold turkey"? Depends. You should always coordinate with your Pain Specialist to make the transition as smoothly as possible. Avoid stopping the medicine abruptly without consulting. We recommend a "slow taper".  What is a slow taper? Taper: refers to the gradual decrease in dose.   How do I stop/taper the dose? Slowly. Decrease the daily amount of pills that you take by one (1) pill every seven (7) days. This is called a "slow downward taper". Example: if you normally take four (4) pills per day, drop it to three (3) pills per day for seven (7) days, then to two (2) pills per day for seven (7) days, then to one (1) per day for seven (7) days, and then stop the medicine. The 14 day "Drug Holiday" starts on the first day without medicine.   Will I experience withdrawals? Unlikely with a slow taper.  What triggers withdrawals? Withdrawals are triggered by the sudden/abrupt stop of high dose opioids. Withdrawals can be avoided by slowly decreasing the dose over a prolonged period of time.  What are withdrawals? Symptoms associated with sudden/abrupt reduction/stopping of high-dose, long-term use of pain medication. Withdrawal are seldom seen on low dose therapy, or patients rarely taking opioid medication.  Early Withdrawal Symptoms may include: Agitation Anxiety Muscle  aches Increased tearing Insomnia Runny nose Sweating Yawning  Late symptoms may include: Abdominal cramping Diarrhea Dilated pupils Goose bumps Nausea Vomiting  (Last update: 05/28/2022) ____________________________________________________________________________________________    ____________________________________________________________________________________________  WARNING: CBD (cannabidiol) & Delta (Delta-8 tetrahydrocannabinol) products.   Applicable to:  All individuals currently taking or considering taking CBD (cannabidiol) and, more important, all patients taking opioid analgesic controlled substances (pain medication). (Example: oxycodone; oxymorphone; hydrocodone; hydromorphone; morphine; methadone; tramadol; tapentadol; fentanyl; buprenorphine; butorphanol; dextromethorphan; meperidine; codeine; etc.)  Introduction:  Recently there has been a drive towards the use of "natural" products for the treatment of different conditions, including pain anxiety and sleep disorders. Marijuana and hemp are two varieties of the cannabis genus plants. Marijuana and its derivatives are illegal, while hemp and its derivatives are not. Cannabidiol (CBD) and tetrahydrocannabinol (THC), are two natural compounds found in plants of the Cannabis genus. They can both be extracted from hemp or marijuana. Both compounds interact with your body's endocannabinoid system in very different ways. CBD is associated with pain relief (analgesia) while THC is associated with the psychoactive effects ("the high") obtained from the use of marijuana products. There are two main types of THC: Delta-9, which comes from the marijuana plant and it is illegal, and Delta-8, which comes from the hemp plant, and it is legal. (Both, Delta-9-THC and Delta-8-THC are psychoactive and give you "the high".)   Legality:  Marijuana and its derivatives: illegal Hemp and its derivatives: Legal (State dependent) UPDATE:  (08/05/2021) The Drug Enforcement Agency (DEA) issued a letter stating that "delta" cannabinoids, including Delta-8-THCO and Delta-9-THCO, synthetically derived from hemp do not qualify as hemp and will be viewed as Schedule I drugs. (Schedule I drugs, substances, or chemicals are defined as drugs with no currently accepted medical use and a high potential for abuse. Some examples of Schedule I drugs are: heroin,   lysergic acid diethylamide (LSD), marijuana (cannabis), 3,4-methylenedioxymethamphetamine (ecstasy), methaqualone, and peyote.) (https://www.dea.gov)  Legal status of CBD in [place]:  "Conditionally Legal"  Reference: "FDA Regulation of Cannabis and Cannabis-Derived Products, Including Cannabidiol (CBD)" - https://www.fda.gov/news-events/public-health-focus/fda-regulation-cannabis-and-cannabis-derived-products-including-cannabidiol-cbd  Warning:  CBD is not FDA approved and has not undergo the same manufacturing controls as prescription drugs.  This means that the purity and safety of available CBD may be questionable. Most of the time, despite manufacturer's claims, it is contaminated with THC (delta-9-tetrahydrocannabinol - the chemical in marijuana responsible for the "HIGH").  When this is the case, the THC contaminant will trigger a positive urine drug screen (UDS) test for Marijuana (carboxy-THC).   The FDA recently put out a warning about 5 things that everyone should be aware of regarding Delta-8 THC: Delta-8 THC products have not been evaluated or approved by the FDA for safe use and may be marketed in ways that put the public health at risk. The FDA has received adverse event reports involving delta-8 THC-containing products. Delta-8 THC has psychoactive and intoxicating effects. Delta-8 THC manufacturing often involve use of potentially harmful chemicals to create the concentrations of delta-8 THC claimed in the marketplace. The final delta-8 THC product may have potentially harmful  by-products (contaminants) due to the chemicals used in the process. Manufacturing of delta-8 THC products may occur in uncontrolled or unsanitary settings, which may lead to the presence of unsafe contaminants or other potentially harmful substances. Delta-8 THC products should be kept out of the reach of children and pets.  NOTE: Because a positive UDS for any illicit substance is a violation of our medication agreement, your opioid analgesics (pain medicine) may be permanently discontinued.  MORE ABOUT CBD  General Information: CBD was discovered in 1940 and it is a derivative of the cannabis sativa genus plants (Marijuana and Hemp). It is one of the 113 identified substances found in Marijuana. It accounts for up to 40% of the plant's extract. As of 2018, preliminary clinical studies on CBD included research for the treatment of anxiety, movement disorders, and pain. CBD is available and consumed in multiple forms, including inhalation of smoke or vapor, as an aerosol spray, and by mouth. It may be supplied as an oil containing CBD, capsules, dried cannabis, or as a liquid solution. CBD is thought not to be as psychoactive as THC (delta-9-tetrahydrocannabinol - the chemical in marijuana responsible for the "HIGH"). Studies suggest that CBD may interact with different biological target receptors in the body, including cannabinoid and other neurotransmitter receptors. As of 2018 the mechanism of action for its biological effects has not been determined.  Side-effects  Adverse reactions: Dry mouth, diarrhea, decreased appetite, fatigue, drowsiness, malaise, weakness, sleep disturbances, and others.  Drug interactions:  CBD may interact with medications such as blood-thinners. CBD causes drowsiness on its own and it will increase drowsiness caused by other medications, including antihistamines (such as Benadryl), benzodiazepines (Xanax, Ativan, Valium), antipsychotics, antidepressants, opioids, alcohol  and supplements such as kava, melatonin and St. John's Wort.  Other drug interactions: Brivaracetam (Briviact); Caffeine; Carbamazepine (Tegretol); Citalopram (Celexa); Clobazam (Onfi); Eslicarbazepine (Aptiom); Everolimus (Zostress); Lithium; Methadone (Dolophine); Rufinamide (Banzel); Sedative medications (CNS depressants); Sirolimus (Rapamune); Stiripentol (Diacomit); Tacrolimus (Prograf); Tamoxifen ; Soltamox); Topiramate (Topamax); Valproate; Warfarin (Coumadin); Zonisamide. (Last update: 05/29/2022) ____________________________________________________________________________________________   ____________________________________________________________________________________________  Naloxone Nasal Spray  Why am I receiving this medication? [place] STOP ACT requires that all patients taking high dose opioids or at risk of opioids respiratory depression, be prescribed an opioid reversal agent, such as   Naloxone (AKA: Narcan).  What is this medication? NALOXONE (nal OX one) treats opioid overdose, which causes slow or shallow breathing, severe drowsiness, or trouble staying awake. Call emergency services after using this medication. You may need additional treatment. Naloxone works by reversing the effects of opioids. It belongs to a group of medications called opioid blockers.  COMMON BRAND NAME(S): Kloxxado, Narcan  What should I tell my care team before I take this medication? They need to know if you have any of these conditions: Heart disease Substance use disorder An unusual or allergic reaction to naloxone, other medications, foods, dyes, or preservatives Pregnant or trying to get pregnant Breast-feeding  When to use this medication? This medication is to be used for the treatment of respiratory depression (less than 8 breaths per minute) secondary to opioid overdose.   How to use this medication? This medication is for use in the nose. Lay the person on their back.  Support their neck with your hand and allow the head to tilt back before giving the medication. The nasal spray should be given into 1 nostril. After giving the medication, move the person onto their side. Do not remove or test the nasal spray until ready to use. Get emergency medical help right away after giving the first dose of this medication, even if the person wakes up. You should be familiar with how to recognize the signs and symptoms of a narcotic overdose. If more doses are needed, give the additional dose in the other nostril. Talk to your care team about the use of this medication in children. While this medication may be prescribed for children as young as newborns for selected conditions, precautions do apply.  Naloxone Overdosage: If you think you have taken too much of this medicine contact a poison control center or emergency room at once.  NOTE: This medicine is only for you. Do not share this medicine with others.  What if I miss a dose? This does not apply.  What may interact with this medication? This is only used during an emergency. No interactions are expected during emergency use. This list may not describe all possible interactions. Give your health care provider a list of all the medicines, herbs, non-prescription drugs, or dietary supplements you use. Also tell them if you smoke, drink alcohol, or use illegal drugs. Some items may interact with your medicine.  What should I watch for while using this medication? Keep this medication ready for use in the case of an opioid overdose. Make sure that you have the phone number of your care team and local hospital ready. You may need to have additional doses of this medication. Each nasal spray contains a single dose. Some emergencies may require additional doses. After use, bring the treated person to the nearest hospital or call 911. Make sure the treating care team knows that the person has received a dose of this medication.  You will receive additional instructions on what to do during and after use of this medication before an emergency occurs.  What side effects may I notice from receiving this medication? Side effects that you should report to your care team as soon as possible: Allergic reactions--skin rash, itching, hives, swelling of the face, lips, tongue, or throat Side effects that usually do not require medical attention (report these to your care team if they continue or are bothersome): Constipation Dryness or irritation inside the nose Headache Increase in blood pressure Muscle spasms Stuffy nose Toothache This list may not   describe all possible side effects. Call your doctor for medical advice about side effects. You may report side effects to FDA at 1-800-FDA-1088.  Where should I keep my medication? Because this is an emergency medication, you should keep it with you at all times.  Keep out of the reach of children and pets. Store between 20 and 25 degrees C (68 and 77 degrees F). Do not freeze. Throw away any unused medication after the expiration date. Keep in original box until ready to use.  NOTE: This sheet is a summary. It may not cover all possible information. If you have questions about this medicine, talk to your doctor, pharmacist, or health care provider.   2023 Elsevier/Gold Standard (2021-02-11 00:00:00)  ____________________________________________________________________________________________   

## 2022-09-06 ENCOUNTER — Encounter: Payer: Self-pay | Admitting: Pain Medicine

## 2022-09-06 ENCOUNTER — Ambulatory Visit: Payer: Medicare Other | Attending: Pain Medicine | Admitting: Pain Medicine

## 2022-09-06 VITALS — BP 144/71 | HR 69 | Temp 97.3°F | Ht 70.0 in | Wt 226.0 lb

## 2022-09-06 DIAGNOSIS — S83242S Other tear of medial meniscus, current injury, left knee, sequela: Secondary | ICD-10-CM | POA: Insufficient documentation

## 2022-09-06 DIAGNOSIS — M7650 Patellar tendinitis, unspecified knee: Secondary | ICD-10-CM | POA: Diagnosis present

## 2022-09-06 DIAGNOSIS — G8929 Other chronic pain: Secondary | ICD-10-CM

## 2022-09-06 DIAGNOSIS — Z79891 Long term (current) use of opiate analgesic: Secondary | ICD-10-CM | POA: Insufficient documentation

## 2022-09-06 DIAGNOSIS — S83282S Other tear of lateral meniscus, current injury, left knee, sequela: Secondary | ICD-10-CM | POA: Insufficient documentation

## 2022-09-06 DIAGNOSIS — M1711 Unilateral primary osteoarthritis, right knee: Secondary | ICD-10-CM | POA: Diagnosis present

## 2022-09-06 DIAGNOSIS — M25562 Pain in left knee: Secondary | ICD-10-CM | POA: Diagnosis not present

## 2022-09-06 DIAGNOSIS — M25561 Pain in right knee: Secondary | ICD-10-CM | POA: Diagnosis present

## 2022-09-06 DIAGNOSIS — M170 Bilateral primary osteoarthritis of knee: Secondary | ICD-10-CM | POA: Diagnosis not present

## 2022-09-06 DIAGNOSIS — M545 Low back pain, unspecified: Secondary | ICD-10-CM | POA: Diagnosis present

## 2022-09-06 DIAGNOSIS — M7651 Patellar tendinitis, right knee: Secondary | ICD-10-CM | POA: Diagnosis not present

## 2022-09-06 DIAGNOSIS — Z79899 Other long term (current) drug therapy: Secondary | ICD-10-CM | POA: Diagnosis present

## 2022-09-06 DIAGNOSIS — M1712 Unilateral primary osteoarthritis, left knee: Secondary | ICD-10-CM

## 2022-09-06 DIAGNOSIS — M4810 Ankylosing hyperostosis [Forestier], site unspecified: Secondary | ICD-10-CM | POA: Insufficient documentation

## 2022-09-06 DIAGNOSIS — G894 Chronic pain syndrome: Secondary | ICD-10-CM | POA: Diagnosis present

## 2022-09-06 MED ORDER — BUPRENORPHINE 10 MCG/HR TD PTWK: 1.0000 | MEDICATED_PATCH | TRANSDERMAL | 0 refills | Status: DC

## 2022-09-06 NOTE — Progress Notes (Signed)
Nursing Pain Medication Assessment:  Safety precautions to be maintained throughout the outpatient stay will include: orient to surroundings, keep bed in low position, maintain call bell within reach at all times, provide assistance with transfer out of bed and ambulation.  Medication Inspection Compliance: Pill count conducted under aseptic conditions, in front of the patient. Neither the pills nor the bottle was removed from the patient's sight at any time. Once count was completed pills were immediately returned to the patient in their original bottle.  Medication: Buprenorphine (Suboxone) Pill/Patch Count:  0 of 4 pills remain Pill/Patch Appearance: Markings consistent with prescribed medication Bottle Appearance: Standard pharmacy container. Clearly labeled. Filled Date: 1 / 24 / 2024 Last Medication intake:  TodaySafety precautions to be maintained throughout the outpatient stay will include: orient to surroundings, keep bed in low position, maintain call bell within reach at all times, provide assistance with transfer out of bed and ambulation.

## 2022-09-07 ENCOUNTER — Encounter: Payer: Self-pay | Admitting: Pain Medicine

## 2022-09-07 ENCOUNTER — Ambulatory Visit: Payer: Medicare Other | Attending: Pain Medicine | Admitting: Pain Medicine

## 2022-09-07 VITALS — BP 131/79 | HR 64 | Temp 97.8°F | Resp 16 | Ht 70.0 in | Wt 210.0 lb

## 2022-09-07 DIAGNOSIS — M25562 Pain in left knee: Secondary | ICD-10-CM | POA: Diagnosis not present

## 2022-09-07 DIAGNOSIS — M4810 Ankylosing hyperostosis [Forestier], site unspecified: Secondary | ICD-10-CM | POA: Diagnosis present

## 2022-09-07 DIAGNOSIS — M1712 Unilateral primary osteoarthritis, left knee: Secondary | ICD-10-CM

## 2022-09-07 DIAGNOSIS — S83282S Other tear of lateral meniscus, current injury, left knee, sequela: Secondary | ICD-10-CM | POA: Diagnosis present

## 2022-09-07 DIAGNOSIS — M25561 Pain in right knee: Secondary | ICD-10-CM | POA: Insufficient documentation

## 2022-09-07 DIAGNOSIS — M7650 Patellar tendinitis, unspecified knee: Secondary | ICD-10-CM | POA: Insufficient documentation

## 2022-09-07 DIAGNOSIS — M1711 Unilateral primary osteoarthritis, right knee: Secondary | ICD-10-CM | POA: Diagnosis present

## 2022-09-07 DIAGNOSIS — S83242S Other tear of medial meniscus, current injury, left knee, sequela: Secondary | ICD-10-CM

## 2022-09-07 DIAGNOSIS — M170 Bilateral primary osteoarthritis of knee: Secondary | ICD-10-CM

## 2022-09-07 DIAGNOSIS — G8929 Other chronic pain: Secondary | ICD-10-CM | POA: Diagnosis not present

## 2022-09-07 MED ORDER — LIDOCAINE HCL (PF) 2 % IJ SOLN
5.0000 mL | Freq: Once | INTRAMUSCULAR | Status: AC
  Administered 2022-09-07: 5 mL

## 2022-09-07 MED ORDER — ROPIVACAINE HCL 2 MG/ML IJ SOLN
INTRAMUSCULAR | Status: AC
  Filled 2022-09-07: qty 20

## 2022-09-07 MED ORDER — METHYLPREDNISOLONE ACETATE 80 MG/ML IJ SUSP
80.0000 mg | Freq: Once | INTRAMUSCULAR | Status: AC
  Administered 2022-09-07: 80 mg via INTRA_ARTICULAR

## 2022-09-07 MED ORDER — ROPIVACAINE HCL 2 MG/ML IJ SOLN
5.0000 mL | Freq: Once | INTRAMUSCULAR | Status: AC
  Administered 2022-09-07: 9 mL via INTRA_ARTICULAR

## 2022-09-07 MED ORDER — LIDOCAINE HCL (PF) 2 % IJ SOLN
INTRAMUSCULAR | Status: AC
  Filled 2022-09-07: qty 5

## 2022-09-07 MED ORDER — ROPIVACAINE HCL 2 MG/ML IJ SOLN: 2.0000 mL | Freq: Once | INTRAMUSCULAR | Status: DC

## 2022-09-07 MED ORDER — PENTAFLUOROPROP-TETRAFLUOROETH EX AERO
INHALATION_SPRAY | Freq: Once | CUTANEOUS | Status: AC
  Administered 2022-09-07: 30 via TOPICAL
  Filled 2022-09-07: qty 116

## 2022-09-07 MED ORDER — METHYLPREDNISOLONE ACETATE 80 MG/ML IJ SUSP
INTRAMUSCULAR | Status: AC
  Filled 2022-09-07: qty 2

## 2022-09-07 NOTE — Progress Notes (Signed)
PROVIDER NOTE: Interpretation of information contained herein should be left to medically-trained personnel. Specific patient instructions are provided elsewhere under "Patient Instructions" section of medical record. This document was created in part using STT-dictation technology, any transcriptional errors that may result from this process are unintentional.  Patient: Aaron Gibbs Type: Established DOB: [birth year] MRN: [id number] PCP: Aaron Hauser, DO  Service: Procedure DOS: 09/07/2022 Setting: Ambulatory Location: Ambulatory outpatient facility Delivery: Face-to-face Provider: Gaspar Cola, MD Specialty: Interventional Pain Management Specialty designation: 09 Location: Outpatient facility Ref. Prov.: Aaron Pointer, MD       Interventional Therapy   Procedure:           Type: Steroid Intra-articular Knee Injection #1  Laterality: Bilateral (-50) Level/approach: Lateral Imaging guidance: None required [id number] Anesthesia: Local anesthesia (1-2% Lidocaine) Anxiolysis: None                 Sedation: No Sedation                       DOS: 09/07/2022  Performed by: Aaron Cola, MD  Purpose: Diagnostic/Therapeutic Indications: Knee arthralgia associated to osteoarthritis of the knee 1. Chronic knee pain (1ry area of Pain) (Bilateral) (L>R)   2. Osteoarthritis of knee (Bilateral)   3. Osteoarthritis of patellofemoral joint (Right)   4. Tricompartment osteoarthritis of knee (Left)   5. Patellar tendinosis (Right)   6. Lateral meniscal tear, sequela (Left)   7. Medial meniscal tear, sequela (Left)   8. DISH (diffuse idiopathic skeletal hyperostosis)    NAS-11 score:   Pre-procedure: 6 /10   Post-procedure: 6 /10     Pre-Procedure Preparation  Monitoring: As per clinic protocol.  Risk Assessment: Vitals:  [id number] body mass index is 30.13 kg/m as calculated from the following:   Height as of this encounter: 5\' 10"  (1.778 m).    Weight as of this encounter: 210 lb (95.3 kg)., Rate:64 , BP:131/79, Resp:16, Temp:97.8 F (36.6 C), SpO2:97 %  Allergies: He is allergic to anthralin and azithromycin.  Precautions: No additional precautions required  Blood-thinner(s): None at this time  Coagulopathies: Reviewed. None identified.   Active Infection(s): Reviewed. None identified. Aaron Gibbs is afebrile   Location setting: Exam room Position: Sitting w/ knee bent 90 degrees Safety Precautions: Patient was assessed for positional comfort and pressure points before starting the procedure. Prepping solution: DuraPrep (Iodine Povacrylex [0.7% available iodine] and Isopropyl Alcohol, 74% w/w) Prep Area: Entire knee region Approach: percutaneous, just above the tibial plateau, lateral to the infrapatellar tendon. Intended target: Intra-articular knee space Materials: Tray: Block Needle(s): Regular Qty: 1/side Length: 1.5-inch Gauge: 25G (x1) + 22G (x1)  Meds ordered this encounter  Medications   pentafluoroprop-tetrafluoroeth (GEBAUERS) aerosol   lidocaine HCl (PF) (XYLOCAINE) 2 % injection 5 mL   ropivacaine (PF) 2 mg/mL (0.2%) (NAROPIN) injection 5 mL   methylPREDNISolone acetate (DEPO-MEDROL) injection 80 mg   ropivacaine (PF) 2 mg/mL (0.2%) (NAROPIN) injection 2 mL   methylPREDNISolone acetate (DEPO-MEDROL) injection 80 mg   ropivacaine (PF) 2 mg/mL (0.2%) (NAROPIN) injection 2 mL    Orders Placed This Encounter  Procedures   KNEE INJECTION    Local Anesthetic & Steroid injection.    Scheduling Instructions:     Side(s): Bilateral Knee     Sedation: None     Timeframe: Today    Order Specific Question:   Where will this procedure be performed?    Answer:   [facility]   Informed Consent  Details: Physician/Practitioner Attestation; Transcribe to consent form and obtain patient signature    Note: Always confirm laterality of pain with Aaron Gibbs, before procedure. Transcribe to consent form and  obtain patient signature.    Order Specific Question:   Physician/Practitioner attestation of informed consent for procedure/surgical case    Answer:   I, the physician/practitioner, attest that I have discussed with the patient the benefits, risks, side effects, alternatives, likelihood of achieving goals and potential problems during recovery for the procedure that I have provided informed consent.    Order Specific Question:   Procedure    Answer:   Bilateral intra-articular knee arthrocentesis (aspiration and/or injection)    Order Specific Question:   Physician/Practitioner performing the procedure    Answer:   Darrnell Mangiaracina A. Dossie Arbour, MD    Order Specific Question:   Indication/Reason    Answer:   Chronic bilateral knee pain secondary to knee arthropathy/arthralgia   Provide equipment / supplies at bedside    Procedure tray: "Block Tray" (Disposable  single use) Skin infiltration needle: Regular 1.5-in, 25-G, (x1) Block Needle type: Regular Amount/quantity: 2 Size: Short(1.5-inch) Gauge: (25G x1) + (22G x1)    Standing Status:   Standing    Number of Occurrences:   1    Order Specific Question:   Specify    Answer:   Block Tray     Time-out: [id number] I initiated and conducted the "Time-out" before starting the procedure, as per protocol. The patient was asked to participate by confirming the accuracy of the "Time Out" information. Verification of the correct person, site, and procedure were performed and confirmed by me, the nursing staff, and the patient. "Time-out" conducted as per Joint Commission's Universal Protocol (UP.01.01.01). Procedure checklist: Completed  H&P (Pre-op  Assessment)  Aaron Gibbs is a 88 y.o. (year old), male patient, seen today for interventional treatment. He  has a past surgical history that includes Cholecystectomy and Transurethral resection of prostate (N/A, 2008). Aaron Gibbs has a current medication list which includes the following prescription(s):  acetaminophen, bumetanide, buprenorphine, [START ON 10/04/2022] buprenorphine, [START ON 11/01/2022] buprenorphine, carvedilol, clotrimazole-betamethasone, cosopt, duloxetine, finasteride, gabapentin, iron (ferrous sulfate), latanoprost, lorazepam, melatonin, meloxicam, olanzapine, pantoprazole, propylene glycol, tamsulosin, trazodone, and diclofenac sodium, and the following Facility-Administered Medications: ropivacaine (pf) 2 mg/ml (0.2%) and ropivacaine (pf) 2 mg/ml (0.2%). His primarily concern today is the No chief complaint on file.  He is allergic to anthralin and azithromycin.   Last encounter: My last encounter with him was on 09/06/2022. Pertinent problems: Mr. Donica has Chronic pain syndrome; DISH (diffuse idiopathic skeletal hyperostosis); Osteoarthritis of multiple joints; Chronic knee pain (1ry area of Pain) (Bilateral) (L>R); Chronic low back pain (Bilateral) w/o sciatica; Somatic symptom disorder; Tricompartment osteoarthritis of knee (Left); Osteoarthritis of knee (Bilateral); Osteoarthritis of patellofemoral joint (Right); Neurogenic pain; Lateral meniscal tear, sequela (Left); Medial meniscal tear, sequela (Left); Patellar tendinosis (Right); Osteoarthritis of knee (Left); Abnormal MRI, knee (08/05/2018) (Left); Chronic knee pain (Left); Pain in right knee; and Other chronic pain on their pertinent problem list. Pain Assessment: Severity of Chronic pain is reported as a 6 /10. Location: Knee Right, Left/denies. Onset: More than a month ago. Quality: Dull, Shooting. Timing: Constant. Modifying factor(s): "pain patch". Vitals:  height is 5\' 10"  (1.778 m) and weight is 210 lb (95.3 kg). His temporal temperature is 97.8 F (36.6 C). His blood pressure is 131/79 and his pulse is 64. His respiration is 16 and oxygen saturation is 97%.   Reason for encounter: "interventional pain  management therapy due pain of at least four (4) weeks in duration, with failure to respond and/or inability to  tolerate more conservative care.  Site Confirmation: Mr. Bovee was asked to confirm the procedure and laterality before marking the site.  Consent: Before the procedure and under the influence of no sedative(s), amnesic(s), or anxiolytics, the patient was informed of the treatment options, risks and possible complications. To fulfill our ethical and legal obligations, as recommended by the American Medical Association's Code of Ethics, I have informed the patient of my clinical impression; the nature and purpose of the treatment or procedure; the risks, benefits, and possible complications of the intervention; the alternatives, including doing nothing; the risk(s) and benefit(s) of the alternative treatment(s) or procedure(s); and the risk(s) and benefit(s) of doing nothing. The patient was provided information about the general risks and possible complications associated with the procedure. These may include, but are not limited to: failure to achieve desired goals, infection, bleeding, organ or nerve damage, allergic reactions, paralysis, and death. In addition, the patient was informed of those risks and complications associated to Spine-related procedures, such as failure to decrease pain; infection (i.e.: Meningitis, epidural or intraspinal abscess); bleeding (i.e.: epidural hematoma, subarachnoid hemorrhage, or any other type of intraspinal or peri-dural bleeding); organ or nerve damage (i.e.: Any type of peripheral nerve, nerve root, or spinal cord injury) with subsequent damage to sensory, motor, and/or autonomic systems, resulting in permanent pain, numbness, and/or weakness of one or several areas of the body; allergic reactions; (i.e.: anaphylactic reaction); and/or death. Furthermore, the patient was informed of those risks and complications associated with the medications. These include, but are not limited to: allergic reactions (i.e.: anaphylactic or anaphylactoid reaction(s)); adrenal axis  suppression; blood sugar elevation that in diabetics may result in ketoacidosis or comma; water retention that in patients with history of congestive heart failure may result in shortness of breath, pulmonary edema, and decompensation with resultant heart failure; weight gain; swelling or edema; medication-induced neural toxicity; particulate matter embolism and blood vessel occlusion with resultant organ, and/or nervous system infarction; and/or aseptic necrosis of one or more joints. Finally, the patient was informed that Medicine is not an exact science; therefore, there is also the possibility of unforeseen or unpredictable risks and/or possible complications that may result in a catastrophic outcome. The patient indicated having understood very clearly. We have given the patient no guarantees and we have made no promises. Enough time was given to the patient to ask questions, all of which were answered to the patient's satisfaction. Mr. Eberts has indicated that he wanted to continue with the procedure. Attestation: I, the ordering provider, attest that I have discussed with the patient the benefits, risks, side-effects, alternatives, likelihood of achieving goals, and potential problems during recovery for the procedure that I have provided informed consent.  Date  Time: 09/07/2022  1:23 PM  Description of procedure  Start Time: 1344 hrs  Local Anesthesia: Once the patient was positioned, prepped, and time-out was completed. The target area was identified located. The skin was marked with an approved surgical skin marker. Once marked, the skin (epidermis, dermis, and hypodermis), and deeper tissues (fat, connective tissue and muscle) were infiltrated with a small amount of a short-acting local anesthetic, loaded on a 10cc syringe with a 25G, 1.5-in  Needle. An appropriate amount of time was allowed for local anesthetics to take effect before proceeding to the next step. Local Anesthetic: Lidocaine  1-2% The unused portion of the local anesthetic was  discarded in the proper designated containers. Safety Precautions: Aspiration looking for blood return was conducted prior to all injections. At no point did I inject any substances, as a needle was being advanced. Before injecting, the patient was told to immediately notify me if he was experiencing any new onset of "ringing in the ears, or metallic taste in the mouth". No attempts were made at seeking any paresthesias. Safe injection practices and needle disposal techniques used. Medications properly checked for expiration dates. SDV (single dose vial) medications used. After the completion of the procedure, all disposable equipment used was discarded in the proper designated medical waste containers.  Technical description: Protocol guidelines were followed. After positioning, the target area was identified and prepped in the usual manner. Skin & deeper tissues infiltrated with local anesthetic. Appropriate amount of time allowed to pass for local anesthetics to take effect. Proper needle placement secured. Once satisfactory needle placement was confirmed, I proceeded to inject the desired solution in slow, incremental fashion, intermittently assessing for discomfort or any signs of abnormal or undesired spread of substance. Once completed, the needle was removed and disposed of, as per hospital protocols. The area was cleaned, making sure to leave some of the prepping solution back to take advantage of its long term bactericidal properties.  Aspiration:  Negative        Vitals:   09/07/22 1322  BP: 131/79  Pulse: 64  Resp: 16  Temp: 97.8 F (36.6 C)  TempSrc: Temporal  SpO2: 97%  Weight: 210 lb (95.3 kg)  Height: 5\' 10"  (1.778 m)    End Time: 1347 hrs  Imaging guidance  Imaging-assisted Technique: None required. Indication(s): N/A Exposure Time: N/A Contrast: None Fluoroscopic Guidance: N/A Ultrasound Guidance:  N/A Interpretation: N/A  Post-op assessment  Post-procedure Vital Signs:  Pulse/HCG Rate: 64  Temp: 97.8 F (36.6 C) Resp: 16 BP: 131/79 SpO2: 97 %  EBL: None  Complications: No immediate post-treatment complications observed by team, or reported by patient.  Note: The patient tolerated the entire procedure well. A repeat set of vitals were taken after the procedure and the patient was kept under observation following institutional policy, for this type of procedure. Post-procedural neurological assessment was performed, showing return to baseline, prior to discharge. The patient was provided with post-procedure discharge instructions, including a section on how to identify potential problems. Should any problems arise concerning this procedure, the patient was given instructions to immediately contact us, at any time, without hesitation. In any case, we plan to contact the patient by telephone for a follow-up status report regarding this interventional procedure.  Comments:  No additional relevant information.  Plan of care  Chronic Opioid Analgesic:  No opioid analgesics prescribed by our practice.  Buprenorphine 10 mcg/h patch q. 7 days.  High risk for SUD.  History of suicidal attempts and noncompliance with medication intake. ED visits thought to be due to medication binging. MME/day: 0 mg/day.   Medications administered: We administered pentafluoroprop-tetrafluoroeth, lidocaine HCl (PF), ropivacaine (PF) 2 mg/mL (0.2%), methylPREDNISolone acetate, and methylPREDNISolone acetate.  Follow-up plan:   Return for Proc-day (T,Th), (Face2F), (PPE).      Interventional Therapies  Risk Factors  Considerations:  Patient is legally blind. High risk for substance use disorder.  The patient has documented suicidal attempt using medications.  In addition, we have documented the patient to have a binge use pattern and been noncompliant with the instructions on how to use the medications.   This is a reason why went to  a buprenorphine patch.   Planned  Pending:   Therapeutic bilateral IA steroid knee inj.   Under consideration:   Palliative bilateral genicular nerve RFA maintenance treatments   Completed:   Palliative bilateral IA Hyalgan knee injections Sx2  Diagnostic bilateral genicular NB x1  Palliative right genicular nerve RFA x2 (04/06/2020) (100/100/50/100)  Palliative left genicular nerve RFA x4 (04/18/2022) (100/100/30/40)    Completed by other providers:   N/A   Therapeutic  Palliative (PRN) options:   Palliative bilateral IA Hyalgan knee injections S3/N1  Diagnostic bilateral genicular NB #2  Palliative bilateral genicular nerve RFA #3    Pharmacotherapy  Nonopioid transferred 11/02/2020: Voltaren gel      Recent Visits Date Type Provider Dept  09/06/22 Office Visit Aaron Pointer, MD [facility]  Showing recent visits within past 90 days and meeting all other requirements Today's Visits Date Type Provider Dept  09/07/22 Procedure visit Aaron Pointer, MD [facility]  Showing today's visits and meeting all other requirements Future Appointments Date Type Provider Dept  09/28/22 Appointment Aaron Pointer, MD [facility]  Showing future appointments within next 90 days and meeting all other requirements   Disposition: Discharge home  Discharge (Date  Time): 09/07/2022; 1400 hrs.   Primary Care Physician: Aaron Hauser, DO Location: [facility] Note by: Aaron Cola, MD Date: 09/07/2022; Time: 2:45 PM  DISCLAIMER: Medicine is not an Chief Strategy Officer. It has no guarantees or warranties. The decision to proceed with this intervention was based on the information collected from the patient. Conclusions were drawn from the patient's questionnaire, interview, and examination. Because information was provided in large part by the patient, it cannot be guaranteed  that it has not been purposely or unconsciously manipulated or altered. Every effort has been made to obtain as much accurate, relevant, available data as possible. Always take into account that the treatment will also be dependent on availability of resources and existing treatment guidelines, considered by other Pain Management Specialists as being common knowledge and practice, at the time of the intervention. It is also important to point out that variation in procedural techniques and pharmacological choices are the acceptable norm. For Medico-Legal review purposes, the indications, contraindications, technique, and results of the these procedures should only be evaluated, judged and interpreted by a Board-Certified Interventional Pain Specialist with extensive familiarity and expertise in the same exact procedure and technique.

## 2022-09-07 NOTE — Patient Instructions (Signed)

## 2022-09-08 ENCOUNTER — Telehealth: Payer: Self-pay

## 2022-09-08 NOTE — Telephone Encounter (Signed)
Post procedure follow up.  Family member states he is sleeping but will call us if he has any questions or concerns.

## 2022-09-13 ENCOUNTER — Ambulatory Visit: Payer: Medicare Other | Admitting: Family Medicine

## 2022-09-13 ENCOUNTER — Encounter: Payer: Self-pay | Admitting: Family Medicine

## 2022-09-13 VITALS — BP 136/84 | HR 78 | Ht 70.0 in | Wt 210.0 lb

## 2022-09-13 DIAGNOSIS — R441 Visual hallucinations: Secondary | ICD-10-CM | POA: Diagnosis not present

## 2022-09-13 DIAGNOSIS — B372 Candidiasis of skin and nail: Secondary | ICD-10-CM

## 2022-09-13 DIAGNOSIS — H543 Unqualified visual loss, both eyes: Secondary | ICD-10-CM | POA: Diagnosis not present

## 2022-09-13 DIAGNOSIS — H3552 Pigmentary retinal dystrophy: Secondary | ICD-10-CM

## 2022-09-13 DIAGNOSIS — J432 Centrilobular emphysema: Secondary | ICD-10-CM

## 2022-09-13 DIAGNOSIS — I5032 Chronic diastolic (congestive) heart failure: Secondary | ICD-10-CM

## 2022-09-13 DIAGNOSIS — L989 Disorder of the skin and subcutaneous tissue, unspecified: Secondary | ICD-10-CM

## 2022-09-13 MED ORDER — MUPIROCIN 2 % EX OINT: 1.0000 | TOPICAL_OINTMENT | Freq: Two times a day (BID) | CUTANEOUS | 1 refills | Status: DC

## 2022-09-13 NOTE — Progress Notes (Signed)
Subjective:    Patient ID: Aaron Gibbs, male    DOB: [birth year], 88 y.o.   MRN: [id number]  Aaron Gibbs is a 88 y.o. male presenting on 09/13/2022 for Hallucinations and Loss of Vision   HPI  Visual Hallucinations  Recent problem with worsening vision over past few weeks. He now describes a gridwork of wires in front him feels like visual impairment is causing this. Now, he has significant reduced vision overall, darkened vision loss, retinitis pigmentosa with progressive vision loss followed by [facility] He has seen some "people" usually in lower field of vision but difficult to discern seems like shadows or movement the way it is described No hallucinations involving auditory concerns. No other visual hallucinations that involve interaction Not related to napping or sleeping or waking up from sleep  They do report superficial pressure ulcer stage 1 without opening drainage. Using Desitin.  Also rash in skin fold of groin, has Nystatin powder will start using again.  Denies dysuria, cough fever chills abdominal pain     09/13/2022    3:30 PM 09/07/2022    1:29 PM 07/17/2022    3:31 PM  Depression screen PHQ 2/9  Decreased Interest 1 0 3  Down, Depressed, Hopeless 0 0 3  PHQ - 2 Score 1 0 6  Altered sleeping 2  3  Tired, decreased energy 0  0  Change in appetite 0  0  Feeling bad or failure about yourself  3  3  Trouble concentrating 3  3  Moving slowly or fidgety/restless 3  3  Suicidal thoughts 2  3  PHQ-9 Score 14  21  Difficult doing work/chores Somewhat difficult  Very difficult    Social History   Tobacco Use   Smoking status: Former    Years: 20    Types: Cigarettes   Smokeless tobacco: Current    Types: Chew   Tobacco comments:    stopped 15 years ago  Vaping Use   Vaping Use: Never used  Substance Use Topics   Alcohol use: Not Currently    Comment: past   Drug use: Never    Review of Systems Per HPI unless specifically indicated  above     Objective:    BP 136/84   Pulse 78   Ht 5\' 10"  (1.778 m)   Wt 210 lb (95.3 kg)   SpO2 97%   BMI 30.13 kg/m   Wt Readings from Last 3 Encounters:  09/13/22 210 lb (95.3 kg)  09/07/22 210 lb (95.3 kg)  09/06/22 226 lb (102.5 kg)    Physical Exam Vitals and nursing note reviewed.  Constitutional:      General: He is not in acute distress.    Appearance: He is well-developed. He is obese. He is not diaphoretic.     Comments: Well-appearing, comfortable, cooperative  HENT:     Head: Normocephalic and atraumatic.  Eyes:     General:        Right eye: No discharge.        Left eye: No discharge.     Conjunctiva/sclera: Conjunctivae normal.  Neck:     Thyroid: No thyromegaly.  Cardiovascular:     Rate and Rhythm: Normal rate and regular rhythm.     Pulses: Normal pulses.     Heart sounds: Normal heart sounds. No murmur heard. Pulmonary:     Effort: Pulmonary effort is normal. No respiratory distress.     Breath sounds: Normal breath sounds. No wheezing or  rales.  Musculoskeletal:        General: Normal range of motion.     Cervical back: Normal range of motion and neck supple.     Comments: Using wheelchair  Lymphadenopathy:     Cervical: No cervical adenopathy.  Skin:    General: Skin is warm and dry.     Findings: Lesion (forearm skin abrasions scabbed) and rash (in groin skin fold with some erythema localized) present. No erythema.  Neurological:     Mental Status: He is alert and oriented to person, place, and time. Mental status is at baseline.  Psychiatric:        Behavior: Behavior normal.     Comments: Well groomed, good eye contact, normal speech and thoughts      Results for orders placed or performed during the hospital encounter of 06/19/22  Comprehensive metabolic panel  Result Value Ref Range   Sodium 141 135 - 145 mmol/L   Potassium 3.9 3.5 - 5.1 mmol/L   Chloride 110 98 - 111 mmol/L   CO2 24 22 - 32 mmol/L   Glucose, Bld 137 (H) 70 -  99 mg/dL   BUN 19 8 - 23 mg/dL   Creatinine, Ser 1.17 0.61 - 1.24 mg/dL   Calcium 9.1 8.9 - 10.3 mg/dL   Total Protein 7.4 6.5 - 8.1 g/dL   Albumin 4.0 3.5 - 5.0 g/dL   AST 33 15 - 41 U/L   ALT 24 0 - 44 U/L   Alkaline Phosphatase 63 38 - 126 U/L   Total Bilirubin 0.8 0.3 - 1.2 mg/dL   GFR, Estimated >60 >60 mL/min   Anion gap 7 5 - 15  Ethanol  Result Value Ref Range   Alcohol, Ethyl (B) Q000111Q Q000111Q mg/dL  Salicylate level  Result Value Ref Range   Salicylate Lvl Q000111Q (L) 7.0 - 30.0 mg/dL  Acetaminophen level  Result Value Ref Range   Acetaminophen (Tylenol), Serum <10 (L) 10 - 30 ug/mL  cbc  Result Value Ref Range   WBC 3.3 (L) 4.0 - 10.5 K/uL   RBC 4.45 4.22 - 5.81 MIL/uL   Hemoglobin 11.2 (L) 13.0 - 17.0 g/dL   HCT 38.2 (L) 39.0 - 52.0 %   MCV 85.8 80.0 - 100.0 fL   MCH 25.2 (L) 26.0 - 34.0 pg   MCHC 29.3 (L) 30.0 - 36.0 g/dL   RDW 21.4 (H) 11.5 - 15.5 %   Platelets 134 (L) 150 - 400 K/uL   nRBC 0.0 0.0 - 0.2 %  Urine Drug Screen, Qualitative  Result Value Ref Range   Tricyclic, Ur Screen POSITIVE (A) NONE DETECTED   Amphetamines, Ur Screen NONE DETECTED NONE DETECTED   MDMA (Ecstasy)Ur Screen NONE DETECTED NONE DETECTED   Cocaine Metabolite,Ur [place] NONE DETECTED NONE DETECTED   Opiate, Ur Screen NONE DETECTED NONE DETECTED   Phencyclidine (PCP) Ur S NONE DETECTED NONE DETECTED   Cannabinoid 50 Ng, Ur [place] NONE DETECTED NONE DETECTED   Barbiturates, Ur Screen NONE DETECTED NONE DETECTED   Benzodiazepine, Ur Scrn NONE DETECTED NONE DETECTED   Methadone Scn, Ur NONE DETECTED NONE DETECTED      Assessment & Plan:   Problem List Items Addressed This Visit     Chronic heart failure with preserved ejection fraction (HCC)   COPD (chronic obstructive pulmonary disease) (HCC)   Retinitis pigmentosa of both eyes   Other Visit Diagnoses     Visual hallucinations    -  Primary   Vision  loss, bilateral       Skin sore       Relevant Medications   mupirocin ointment  (BACTROBAN) 2 %   Candidal intertrigo       Relevant Medications   nystatin (MYCOSTATIN/NYSTOP) powder   mupirocin ointment (BACTROBAN) 2 %      Reassurance today Vision hallucinations seems to mostly be due to reduced vision Can discuss with [facility] if needed Otherwise no treatments required Goal to keep well lit areas but provide assistance.  Skin sores use topical Mupirocin ointment antibiotic, 1-2 times per day for 1-2 weeks or until healed.  Pressure sore stage 1 Use Desitin barrier cream on the bottom. Avoid excessive pressure directly on the same spot if possible  Candidal Intertrigo Use Nystatin powder in skin fold several times a day to keep dry if flared up.   Meds ordered this encounter  Medications   mupirocin ointment (BACTROBAN) 2 %    Sig: Apply 1 Application topically 2 (two) times daily. For 1-2 weeks or until healed for superficial skin sore and abrasion    Dispense:  22 g    Refill:  1     Follow up plan: Return if symptoms worsen or fail to improve.    [name], [facility] 09/13/2022, 2:23 PM

## 2022-09-13 NOTE — Patient Instructions (Addendum)
Keep up the good work overall  Skin sores use topical Mupirocin ointment antibiotic, 1-2 times per day for 1-2 weeks or until healed.  Use Desitin barrier cream on the bottom.  Avoid excessive pressure directly on the same spot if possible  Vision hallucinations seems to mostly be due to reduced vision Can discuss with [facility] if needed Otherwise no treatments required Goal to keep well lit areas but provide assistance.  Use Nystatin powder in skin fold several times a day to keep dry if flared up. I can order more if need  History of Diastolic Heart Failure / Preserved function heart failure - this is the "failure to relax" on the heart, no medications needed, if it worsens the fluid swelling we can address it, otherwise, no treatment needed.  Please schedule a Follow-up Appointment to: Return if symptoms worsen or fail to improve.  If you have any other questions or concerns, please feel free to call the office or send a message through [facility]. You may also schedule an earlier appointment if necessary.  Additionally, you may be receiving a survey about your experience at our office within a few days to 1 week by e-mail or mail. We value your feedback.  [name], DO [place]

## 2022-09-14 ENCOUNTER — Other Ambulatory Visit: Payer: Self-pay | Admitting: Family Medicine

## 2022-09-14 DIAGNOSIS — I10 Essential (primary) hypertension: Secondary | ICD-10-CM

## 2022-09-14 NOTE — Telephone Encounter (Signed)
Requested Prescriptions  Pending Prescriptions Disp Refills   carvedilol (COREG) 12.5 MG tablet [Pharmacy Med Name: Carvedilol 12.5 MG Oral Tablet] 180 tablet 0    Sig: Take 1 tablet by mouth twice daily     Cardiovascular: Beta Blockers 3 Passed - 09/14/2022  8:33 AM      Passed - Cr in normal range and within 360 days    Creatinine, Ser  Date Value Ref Range Status  06/19/2022 1.17 0.61 - 1.24 mg/dL Final   Creatinine, Urine  Date Value Ref Range Status  12/13/2019 631 mg/dL Final    Comment:    RESULTS CONFIRMED BY MANUAL DILUTION Performed at [facility], [address], [place],  [address]          Passed - AST in normal range and within 360 days    AST  Date Value Ref Range Status  06/19/2022 33 15 - 41 U/L Final         Passed - ALT in normal range and within 360 days    ALT  Date Value Ref Range Status  06/19/2022 24 0 - 44 U/L Final         Passed - Last BP in normal range    BP Readings from Last 1 Encounters:  09/13/22 136/84         Passed - Last Heart Rate in normal range    Pulse Readings from Last 1 Encounters:  09/13/22 78         Passed - Valid encounter within last 6 months    Recent Outpatient Visits           Yesterday Visual hallucinations   [place], DO   1 month ago Generalized anxiety disorder with panic attacks   [place], DO   2 months ago Generalized anxiety disorder with panic attacks   [place], DO   2 months ago Generalized anxiety disorder with panic attacks   [place], DO   3 months ago Symptomatic anemia   [place], [place]

## 2022-09-17 DIAGNOSIS — F32A Depression, unspecified: Secondary | ICD-10-CM | POA: Diagnosis not present

## 2022-09-17 DIAGNOSIS — J449 Chronic obstructive pulmonary disease, unspecified: Secondary | ICD-10-CM

## 2022-09-18 DIAGNOSIS — S12100A Unspecified displaced fracture of second cervical vertebra, initial encounter for closed fracture: Secondary | ICD-10-CM

## 2022-09-18 DIAGNOSIS — G8921 Chronic pain due to trauma: Secondary | ICD-10-CM

## 2022-09-18 HISTORY — DX: Unspecified displaced fracture of second cervical vertebra, initial encounter for closed fracture: S12.100A

## 2022-09-18 HISTORY — DX: Chronic pain due to trauma: G89.21

## 2022-09-20 ENCOUNTER — Other Ambulatory Visit: Payer: Self-pay | Admitting: Family Medicine

## 2022-09-20 DIAGNOSIS — R399 Unspecified symptoms and signs involving the genitourinary system: Secondary | ICD-10-CM

## 2022-09-20 DIAGNOSIS — I509 Heart failure, unspecified: Secondary | ICD-10-CM

## 2022-09-20 DIAGNOSIS — R351 Nocturia: Secondary | ICD-10-CM

## 2022-09-20 NOTE — Telephone Encounter (Signed)
Requested Prescriptions  Pending Prescriptions Disp Refills   bumetanide (BUMEX) 1 MG tablet [Pharmacy Med Name: Bumetanide 1 MG Oral Tablet] 90 tablet 0    Sig: Take 1/2 (one-half) tablet by mouth twice daily     Cardiovascular:  Diuretics - Loop Failed - 09/20/2022  1:36 PM      Failed - Mg Level in normal range and within 180 days    Magnesium  Date Value Ref Range Status  01/22/2022 2.0 1.7 - 2.4 mg/dL Final    Comment:    Performed at [facility], [address] [place], [place] [address]         Passed - K in normal range and within 180 days    Potassium  Date Value Ref Range Status  06/19/2022 3.9 3.5 - 5.1 mmol/L Final         Passed - Ca in normal range and within 180 days    Calcium  Date Value Ref Range Status  06/19/2022 9.1 8.9 - 10.3 mg/dL Final   Calcium, Corrected  Date Value Ref Range Status  08/15/2016 9.3  Final         Passed - Na in normal range and within 180 days    Sodium  Date Value Ref Range Status  06/19/2022 141 135 - 145 mmol/L Final  07/10/2018 140 134 - 144 mmol/L Final         Passed - Cr in normal range and within 180 days    Creatinine, Ser  Date Value Ref Range Status  06/19/2022 1.17 0.61 - 1.24 mg/dL Final   Creatinine, Urine  Date Value Ref Range Status  12/13/2019 631 mg/dL Final    Comment:    RESULTS CONFIRMED BY MANUAL DILUTION Performed at [facility], [address] [place], [place] [address]          Passed - Cl in normal range and within 180 days    Chloride  Date Value Ref Range Status  06/19/2022 110 98 - 111 mmol/L Final         Passed - Last BP in normal range    BP Readings from Last 1 Encounters:  09/13/22 136/84         Passed - Valid encounter within last 6 months    Recent Outpatient Visits           1 week ago Visual hallucinations   [place], DO   2 months ago Generalized anxiety disorder with panic attacks   [place], DO   2 months ago Generalized anxiety disorder with panic attacks   [place], DO   2 months ago Generalized anxiety disorder with panic attacks   [employer], DO   3 months ago Symptomatic anemia   [place], Devonne Doughty, DO               tamsulosin (FLOMAX) 0.4 MG CAPS capsule [Pharmacy Med Name: Tamsulosin HCl 0.4 MG Oral Capsule] 90 capsule 0    Sig: Take 1 capsule by mouth once daily     Urology: Alpha-Adrenergic Blocker Failed - 09/20/2022  1:36 PM      Failed - PSA in normal range and within 360 days    No results found for: "LABPSA", "PSA", "PSA1", "ULTRAPSA"  Passed - Last BP in normal range    BP Readings from Last 1 Encounters:  09/13/22 136/84         Passed - Valid encounter within last 12 months    Recent Outpatient Visits           1 week ago Visual hallucinations   [place], DO   2 months ago Generalized anxiety disorder with panic attacks   [place], DO   2 months ago Generalized anxiety disorder with panic attacks   [place], DO   2 months ago Generalized anxiety disorder with panic attacks   [place], DO   3 months ago Symptomatic anemia   [place], [place]

## 2022-09-20 NOTE — Telephone Encounter (Signed)
Requested medication (s) are due for refill today: yes  Requested medication (s) are on the active medication list: yes  Last refill:  04/04/22 #90/1  Future visit scheduled: no  Notes to clinic:  Unable to refill per protocol due to failed labs, no updated results.     Requested Prescriptions  Pending Prescriptions Disp Refills   tamsulosin (FLOMAX) 0.4 MG CAPS capsule [Pharmacy Med Name: Tamsulosin HCl 0.4 MG Oral Capsule] 90 capsule 0    Sig: Take 1 capsule by mouth once daily     Urology: Alpha-Adrenergic Blocker Failed - 09/20/2022  1:36 PM      Failed - PSA in normal range and within 360 days    No results found for: "LABPSA", "PSA", "PSA1", "ULTRAPSA"       Passed - Last BP in normal range    BP Readings from Last 1 Encounters:  09/13/22 136/84         Passed - Valid encounter within last 12 months    Recent Outpatient Visits           1 week ago Visual hallucinations   [place], DO   2 months ago Generalized anxiety disorder with panic attacks   [place], DO   2 months ago Generalized anxiety disorder with panic attacks   [place], DO   2 months ago Generalized anxiety disorder with panic attacks   [place], DO   3 months ago Symptomatic anemia   [facility], Devonne Doughty, DO              Signed Prescriptions Disp Refills   bumetanide (BUMEX) 1 MG tablet 90 tablet 0    Sig: Take 1/2 (one-half) tablet by mouth twice daily     Cardiovascular:  Diuretics - Loop Failed - 09/20/2022  1:36 PM      Failed - Mg Level in normal range and within 180 days    Magnesium  Date Value Ref Range Status  01/22/2022 2.0 1.7 - 2.4 mg/dL Final    Comment:    Performed at [facility], [address]  [place], [place] [address]         Passed - K in normal range and within 180 days    Potassium  Date Value Ref Range Status  06/19/2022 3.9 3.5 - 5.1 mmol/L Final         Passed - Ca in normal range and within 180 days    Calcium  Date Value Ref Range Status  06/19/2022 9.1 8.9 - 10.3 mg/dL Final   Calcium, Corrected  Date Value Ref Range Status  08/15/2016 9.3  Final         Passed - Na in normal range and within 180 days    Sodium  Date Value Ref Range Status  06/19/2022 141 135 - 145 mmol/L Final  07/10/2018 140 134 - 144 mmol/L Final         Passed - Cr in normal range and within 180 days    Creatinine, Ser  Date Value Ref Range Status  06/19/2022 1.17 0.61 - 1.24 mg/dL Final   Creatinine, Urine  Date Value Ref Range Status  12/13/2019 631 mg/dL Final    Comment:    RESULTS CONFIRMED BY MANUAL DILUTION Performed at [facility], [address] [place], [place] [address]          Passed - Cl in normal range and within 180 days    Chloride  Date Value Ref Range Status  06/19/2022 110 98 - 111 mmol/L Final         Passed - Last BP in normal range    BP Readings from Last 1 Encounters:  09/13/22 136/84         Passed - Valid encounter within last 6 months    Recent Outpatient Visits           1 week ago Visual hallucinations   [place], DO   2 months ago Generalized anxiety disorder with panic attacks   [place], DO   2 months ago Generalized anxiety disorder with panic attacks   [place], DO   2 months ago Generalized anxiety disorder with panic attacks   [place], DO   3 months ago Symptomatic anemia   [place], [place]

## 2022-09-22 ENCOUNTER — Other Ambulatory Visit: Payer: Self-pay | Admitting: Family Medicine

## 2022-09-22 DIAGNOSIS — I10 Essential (primary) hypertension: Secondary | ICD-10-CM

## 2022-09-22 NOTE — Telephone Encounter (Signed)
Medication no longer on current medication list Requested Prescriptions  Pending Prescriptions Disp Refills   losartan (COZAAR) 50 MG tablet [Pharmacy Med Name: Losartan Potassium 50 MG Oral Tablet] 90 tablet 0    Sig: Take 1 tablet by mouth once daily     Cardiovascular:  Angiotensin Receptor Blockers Passed - 09/22/2022  9:27 AM      Passed - Cr in normal range and within 180 days    Creatinine, Ser  Date Value Ref Range Status  06/19/2022 1.17 0.61 - 1.24 mg/dL Final   Creatinine, Urine  Date Value Ref Range Status  12/13/2019 631 mg/dL Final    Comment:    RESULTS CONFIRMED BY MANUAL DILUTION Performed at [facility], [address] [place], [place] [address]          Passed - K in normal range and within 180 days    Potassium  Date Value Ref Range Status  06/19/2022 3.9 3.5 - 5.1 mmol/L Final         Passed - Patient is not pregnant      Passed - Last BP in normal range    BP Readings from Last 1 Encounters:  09/13/22 136/84         Passed - Valid encounter within last 6 months    Recent Outpatient Visits           1 week ago Visual hallucinations   [facility] [place], Netta Neat, DO   2 months ago Generalized anxiety disorder with panic attacks   [facility] [place], Netta Neat, DO   2 months ago Generalized anxiety disorder with panic attacks    [facility] [place], Netta Neat, DO   2 months ago Generalized anxiety disorder with panic attacks   [place] [facility] [place], Netta Neat, DO   3 months ago Symptomatic anemia   [facility] [place], Netta Neat, [place]

## 2022-09-25 ENCOUNTER — Ambulatory Visit: Payer: Medicare Other | Admitting: Pain Medicine

## 2022-09-27 ENCOUNTER — Other Ambulatory Visit: Payer: Medicare Other

## 2022-09-27 VITALS — BP 150/82 | HR 63 | Temp 97.1°F

## 2022-09-27 DIAGNOSIS — Z515 Encounter for palliative care: Secondary | ICD-10-CM

## 2022-09-27 NOTE — Progress Notes (Signed)
PROVIDER NOTE: Information contained herein reflects review and annotations entered in association with encounter. Interpretation of such information and data should be left to medically-trained personnel. Information provided to patient can be located elsewhere in the medical record under "Patient Instructions". Document created using STT-dictation technology, any transcriptional errors that may result from process are unintentional.    Patient: Aaron Gibbs  Service Category: E/M  Provider: Oswaldo Done, MD  DOB: 04/26/1935  DOS: 09/28/2022  Referring Provider: Saralyn Pilar *  MRN: [id number]  Specialty: Interventional Pain Management  PCP: Smitty Cords, DO  Type: Established Patient  Setting: Ambulatory outpatient    Location: Office  Delivery: Face-to-face     HPI  Aaron Gibbs, a 88 y.o. year old male, is here today because of his No primary diagnosis found.. Aaron Gibbs primary complain today is No chief complaint on file.  Pertinent problems: [name] has Chronic pain syndrome; DISH (diffuse idiopathic skeletal hyperostosis); Osteoarthritis of multiple joints; Chronic knee pain (1ry area of Pain) (Bilateral) (L>R); Chronic low back pain (Bilateral) w/o sciatica; Somatic symptom disorder; Tricompartment osteoarthritis of knee (Left); Osteoarthritis of knee (Bilateral); Osteoarthritis of patellofemoral joint (Right); Neurogenic pain; Lateral meniscal tear, sequela (Left); Medial meniscal tear, sequela (Left); Patellar tendinosis (Right); Osteoarthritis of knee (Left); Abnormal MRI, knee (08/05/2018) (Left); Chronic knee pain (Left); Pain in right knee; and Other chronic pain on their pertinent problem list. Pain Assessment: Severity of   is reported as a  /10. Location:    / . Onset:  . Quality:  . Timing:  . Modifying factor(s):  Marland Kitchen Vitals:  vitals were not taken for this visit.  BMI: Estimated body mass index is 30.13 kg/m as calculated from the  following:   Height as of 09/13/22: 5\' 10"  (1.778 m).   Weight as of 09/13/22: 210 lb (95.3 kg). Last encounter: 09/06/2022. Last procedure: 09/07/2022.  Reason for encounter: post-procedure evaluation and assessment. ***  Post-procedure evaluation   Type: Steroid Intra-articular Knee Injection #1  Laterality: Bilateral (-50) Level/approach: Lateral Imaging guidance: None required ([id number]) Anesthesia: Local anesthesia (1-2% Lidocaine) Anxiolysis: None                 Sedation: No Sedation                       DOS: 09/07/2022  Performed by: Oswaldo Done, MD  Purpose: Diagnostic/Therapeutic Indications: Knee arthralgia associated to osteoarthritis of the knee 1. Chronic knee pain (1ry area of Pain) (Bilateral) (L>R)   2. Osteoarthritis of knee (Bilateral)   3. Osteoarthritis of patellofemoral joint (Right)   4. Tricompartment osteoarthritis of knee (Left)   5. Patellar tendinosis (Right)   6. Lateral meniscal tear, sequela (Left)   7. Medial meniscal tear, sequela (Left)   8. DISH (diffuse idiopathic skeletal hyperostosis)    NAS-11 score:   Pre-procedure: 6 /10   Post-procedure: 6 /10     Effectiveness:  Initial hour after procedure:   ***. Subsequent 4-6 hours post-procedure:   ***. Analgesia past initial 6 hours:   ***. Ongoing improvement:  Analgesic:  *** Function:    ***    ROM:    ***     Pharmacotherapy Assessment  Analgesic: No opioid analgesics prescribed by our practice.  Buprenorphine 10 mcg/h patch q. 7 days.  High risk for SUD.  History of suicidal attempts and noncompliance with medication intake. ED visits thought to be due to medication binging. MME/day: 0 mg/day.  Monitoring: [place] PMP: PDMP reviewed during this encounter.       Pharmacotherapy: No side-effects or adverse reactions reported. Compliance: No problems identified. Effectiveness: Clinically acceptable.  No notes on file  No results found for: "CBDTHCR" No results found for:  "D8THCCBX" No results found for: "D9THCCBX"  UDS:  Summary  Date Value Ref Range Status  01/26/2021 Note  Final    Comment:    ==================================================================== [employer] [facility] 13 (MW) ==================================================================== Test                             Result       Flag       Units  Drug Present and Declared for Prescription Verification   Lorazepam                      796          EXPECTED   ng/mg creat    Source of lorazepam is a scheduled prescription medication.    Buprenorphine                  3            EXPECTED   ng/mg creat    Sources of buprenorphine include scheduled prescription medications.  Drug Absent but Declared for Prescription Verification   Tramadol                       Not Detected UNEXPECTED ng/mg creat ==================================================================== Test                      Result    Flag   Units      Ref Range   Creatinine              156              mg/dL      >=95 ==================================================================== Declared Medications:  The flagging and interpretation on this report are based on the  following declared medications.  Unexpected results may arise from  inaccuracies in the declared medications.   **Note: The testing scope of this panel includes these medications:   Lorazepam (Ativan)  Tramadol (Ultram)   **Note: The testing scope of this panel does not include small to  moderate amounts of these reported medications:   Buprenorphine Patch (BuTrans)   **Note: The testing scope of this panel does not include the  following reported medications:   Acetaminophen (Tylenol)  Albuterol (Ventolin HFA)  Amlodipine (Norvasc)  Azelastine  Diclofenac (Voltaren)  Dorzolamide  Duloxetine (Cymbalta)  Fenofibrate (TriCor)  Fluticasone (Flonase)  Hydroxyzine (Atarax)  Ibuprofen (Advil)  Iron  Montelukast (Singulair)   Multivitamin  Mupirocin (Bactroban)  Pantoprazole (Protonix)  Prednisolone  Quetiapine (Seroquel)  Risperidone (Risperdal)  Tamsulosin (Flomax)  Trazodone (Desyrel) ==================================================================== For clinical consultation, please call [phone]. ====================================================================       ROS  Constitutional: Denies any fever or chills Gastrointestinal: No reported hemesis, hematochezia, vomiting, or acute GI distress Musculoskeletal: Denies any acute onset joint swelling, redness, loss of ROM, or weakness Neurological: No reported episodes of acute onset apraxia, aphasia, dysarthria, agnosia, amnesia, paralysis, loss of coordination, or loss of consciousness  Medication Review  DULoxetine, Iron (Ferrous Sulfate), LORazepam, Melatonin, OLANZapine, Propylene Glycol, acetaminophen, bumetanide, buprenorphine, carvedilol, clotrimazole-betamethasone, diclofenac Sodium, dorzolamide-timolol, finasteride, gabapentin, latanoprost, meloxicam, mupirocin ointment, nystatin, pantoprazole, tamsulosin, and traZODone  History Review  Allergy: Mr. Granger  is allergic to anthralin and azithromycin. Drug: Mr. Angelini  reports no history of drug use. Alcohol:  reports that he does not currently use alcohol. Tobacco:  reports that he has quit smoking. His smoking use included cigarettes. His smokeless tobacco use includes chew. Social: Mr. Dobrin  reports that he has quit smoking. His smoking use included cigarettes. His smokeless tobacco use includes chew. He reports that he does not currently use alcohol. He reports that he does not use drugs. Medical:  has a past medical history of Anxiety, Cancer (HCC), COPD (chronic obstructive pulmonary disease) (HCC), Glaucoma, and Osteoporosis. Surgical: Mr. Graulich  has a past surgical history that includes Cholecystectomy and Transurethral resection of prostate (N/A, 2008). Family:  family history includes Depression in his mother; Heart disease in his father.  Laboratory Chemistry Profile   Renal Lab Results  Component Value Date   BUN 19 06/19/2022   CREATININE 1.17 06/19/2022   LABCREA 631 12/13/2019   BCR 28 (H) 07/10/2018   GFRAA >60 02/09/2020   GFRNONAA >60 06/19/2022    Hepatic Lab Results  Component Value Date   AST 33 06/19/2022   ALT 24 06/19/2022   ALBUMIN 4.0 06/19/2022   ALKPHOS 63 06/19/2022   LIPASE 24 01/22/2022   AMMONIA 25 09/13/2019    Electrolytes Lab Results  Component Value Date   NA 141 06/19/2022   K 3.9 06/19/2022   CL 110 06/19/2022   CALCIUM 9.1 06/19/2022   MG 2.0 01/22/2022   PHOS 3.4 03/30/2019    Bone Lab Results  Component Value Date   VD25OH 26 08/15/2016   25OHVITD1 34 07/10/2018   25OHVITD2 <1.0 07/10/2018   25OHVITD3 34 07/10/2018    Inflammation (CRP: Acute Phase) (ESR: Chronic Phase) Lab Results  Component Value Date   CRP 5 07/10/2018   ESRSEDRATE 20 07/10/2018   LATICACIDVEN 0.9 01/29/2022         Note: Above Lab results reviewed.  Recent Imaging Review  CT ABDOMEN PELVIS W CONTRAST CLINICAL DATA:  Acute abdominal pain and suspected pulmonary embolism.  EXAM: CT ANGIOGRAPHY CHEST  CT ABDOMEN AND PELVIS WITH CONTRAST  TECHNIQUE: Multidetector CT imaging of the chest was performed using the standard protocol during bolus administration of intravenous contrast. Multiplanar CT image reconstructions and MIPs were obtained to evaluate the vascular anatomy. Multidetector CT imaging of the abdomen and pelvis was performed using the standard protocol during bolus administration of intravenous contrast.  RADIATION DOSE REDUCTION: This exam was performed according to the departmental dose-optimization program which includes automated exposure control, adjustment of the mA and/or kV according to patient size and/or use of iterative reconstruction technique.  CONTRAST:  OMNIPAQUE IOHEXOL  350 MG/ML SOLN  COMPARISON:  November of 2019.  FINDINGS: CTA CHEST FINDINGS  Cardiovascular: Calcified aortic atherosclerotic changes. Tortuous great vessels in the chest, grossly similar to previous imaging, not well assessed currently due to bolus timing, demise for evaluation of pulmonary arterial bed. Heart size normal without signs of pericardial effusion or nodularity.  Central pulmonary vasculature is opacified to 321 Hounsfield units. Studies adequate for evaluation to the central segmental level and is negative for pulmonary embolism  Mediastinum/Nodes: No thoracic inlet lymphadenopathy. No axillary lymphadenopathy. No mediastinal lymphadenopathy. No hilar lymphadenopathy.  Lungs/Pleura: No consolidation. No pleural effusion. Stable mild scarring along the major fissure in the LEFT upper lobe. Mild pleural and parenchymal scarring in the lingula is similar also to previous imaging. No pneumothorax. Large airways are grossly patent.  Musculoskeletal: No  chest wall mass. No acute process relative to the bony thorax. No destructive bone lesion. Spinal degenerative changes. Signs of ankylosing spondylitis with multilevel spinal fusion  Review of the MIP images confirms the above findings.  CT ABDOMEN and PELVIS FINDINGS  Hepatobiliary: No suspicious hepatic lesion or acute hepatic or biliary abnormality. Mild biliary duct distension following cholecystectomy is unchanged. Portal vein is patent.  Pancreas: Moderate pancreatic atrophy without signs of inflammation or visible lesion.  Spleen: Normal.  Adrenals/Urinary Tract:  Adrenal glands are unremarkable. Symmetric renal enhancement. No sign of hydronephrosis. No suspicious renal lesion or perinephric stranding.  Urinary bladder is grossly unremarkable.  Urinary bladder herniates over the pubic symphysis without adjacent stranding. Similar to previous imaging.  Stomach/Bowel: Stomach without signs of  adjacent stranding. Under distension limiting assessment. No sign of small bowel obstruction or inflammation. Appendix is normal. No acute colonic process. Diverticulosis of the colon. Diverticular changes are mild-to-moderate.  Vascular/Lymphatic: Infrarenal abdominal aortic aneurysm measuring approximately 4.9 x 4.2 greatest dimension. Aneurysm is tortuous. Previous maximal diameter approximately 4.2 x 3.8 cm. No adjacent stranding or signs of dissection. Peripheral soft plaque slightly increased from previous imaging. No adenopathy in the abdomen.  No adenopathy in the pelvis.  Reproductive: Unremarkable by CT.  Other: Ventral abdominal wall laxity with suprapubic open "herniation" of the urinary bladder as outlined above. No pneumoperitoneum. No ascites.  Musculoskeletal: Signs of ankylosing spondylitis with ankylosis of the bilateral sacroiliac joints and extensive bony ankylosis of the spine. Osteopenia. No acute bone finding or destructive bone process. Avascular necrosis of femoral heads without collapse  Review of the MIP images confirms the above findings.  IMPRESSION: 1. No evidence of pulmonary embolism to the central segmental level. 2. No acute findings in the chest, abdomen or pelvis. 3. Infrarenal abdominal aortic aneurysm measuring approximately 4.9 x 4.2 greatest dimension. Previous maximal diameter approximately 4.2 x 3.8 cm in 2019. No adjacent stranding or signs of dissection. Recommend follow-up every 6 months and vascular consultation. 4. Signs of ankylosing spondylitis with multilevel spinal fusion. 5. Aortic atherosclerosis.  Aortic Atherosclerosis (ICD10-I70.0).  Electronically Signed   By: Donzetta KohutGeoffrey  Wile M.D.   On: 06/16/2022 15:19 CT Angio Chest PE W and/or Wo Contrast CLINICAL DATA:  Acute abdominal pain and suspected pulmonary embolism.  EXAM: CT ANGIOGRAPHY CHEST  CT ABDOMEN AND PELVIS WITH CONTRAST  TECHNIQUE: Multidetector CT  imaging of the chest was performed using the standard protocol during bolus administration of intravenous contrast. Multiplanar CT image reconstructions and MIPs were obtained to evaluate the vascular anatomy. Multidetector CT imaging of the abdomen and pelvis was performed using the standard protocol during bolus administration of intravenous contrast.  RADIATION DOSE REDUCTION: This exam was performed according to the departmental dose-optimization program which includes automated exposure control, adjustment of the mA and/or kV according to patient size and/or use of iterative reconstruction technique.  CONTRAST:  100mL OMNIPAQUE IOHEXOL 350 MG/ML SOLN  COMPARISON:  November of 2019.  FINDINGS: CTA CHEST FINDINGS  Cardiovascular: Calcified aortic atherosclerotic changes. Tortuous great vessels in the chest, grossly similar to previous imaging, not well assessed currently due to bolus timing, demise for evaluation of pulmonary arterial bed. Heart size normal without signs of pericardial effusion or nodularity.  Central pulmonary vasculature is opacified to 321 Hounsfield units. Studies adequate for evaluation to the central segmental level and is negative for pulmonary embolism  Mediastinum/Nodes: No thoracic inlet lymphadenopathy. No axillary lymphadenopathy. No mediastinal lymphadenopathy. No hilar lymphadenopathy.  Lungs/Pleura: No consolidation. No pleural  effusion. Stable mild scarring along the major fissure in the LEFT upper lobe. Mild pleural and parenchymal scarring in the lingula is similar also to previous imaging. No pneumothorax. Large airways are grossly patent.  Musculoskeletal: No chest wall mass. No acute process relative to the bony thorax. No destructive bone lesion. Spinal degenerative changes. Signs of ankylosing spondylitis with multilevel spinal fusion  Review of the MIP images confirms the above findings.  CT ABDOMEN and PELVIS  FINDINGS  Hepatobiliary: No suspicious hepatic lesion or acute hepatic or biliary abnormality. Mild biliary duct distension following cholecystectomy is unchanged. Portal vein is patent.  Pancreas: Moderate pancreatic atrophy without signs of inflammation or visible lesion.  Spleen: Normal.  Adrenals/Urinary Tract:  Adrenal glands are unremarkable. Symmetric renal enhancement. No sign of hydronephrosis. No suspicious renal lesion or perinephric stranding.  Urinary bladder is grossly unremarkable.  Urinary bladder herniates over the pubic symphysis without adjacent stranding. Similar to previous imaging.  Stomach/Bowel: Stomach without signs of adjacent stranding. Under distension limiting assessment. No sign of small bowel obstruction or inflammation. Appendix is normal. No acute colonic process. Diverticulosis of the colon. Diverticular changes are mild-to-moderate.  Vascular/Lymphatic: Infrarenal abdominal aortic aneurysm measuring approximately 4.9 x 4.2 greatest dimension. Aneurysm is tortuous. Previous maximal diameter approximately 4.2 x 3.8 cm. No adjacent stranding or signs of dissection. Peripheral soft plaque slightly increased from previous imaging. No adenopathy in the abdomen.  No adenopathy in the pelvis.  Reproductive: Unremarkable by CT.  Other: Ventral abdominal wall laxity with suprapubic open "herniation" of the urinary bladder as outlined above. No pneumoperitoneum. No ascites.  Musculoskeletal: Signs of ankylosing spondylitis with ankylosis of the bilateral sacroiliac joints and extensive bony ankylosis of the spine. Osteopenia. No acute bone finding or destructive bone process. Avascular necrosis of femoral heads without collapse  Review of the MIP images confirms the above findings.  IMPRESSION: 1. No evidence of pulmonary embolism to the central segmental level. 2. No acute findings in the chest, abdomen or pelvis. 3. Infrarenal abdominal  aortic aneurysm measuring approximately 4.9 x 4.2 greatest dimension. Previous maximal diameter approximately 4.2 x 3.8 cm in 2019. No adjacent stranding or signs of dissection. Recommend follow-up every 6 months and vascular consultation. 4. Signs of ankylosing spondylitis with multilevel spinal fusion. 5. Aortic atherosclerosis.  Aortic Atherosclerosis (ICD10-I70.0).  Electronically Signed   By: Donzetta Kohut M.D.   On: 06/16/2022 15:19 DG Chest 2 View CLINICAL DATA:  Shortness of breath and chills  EXAM: CHEST - 2 VIEW  COMPARISON:  Chest radiograph 05/27/2022  FINDINGS: The cardiomediastinal silhouette is stable and within normal limits.  A portion of the right lung apex is obscured the patient's head. Within this confine, there is no focal consolidation or pulmonary edema. There is no pleural effusion or pneumothorax.  There is no acute osseous abnormality. Fusion across the thoracic vertebral bodies is again seen.  IMPRESSION: Stable chest with no radiographic evidence of acute cardiopulmonary process.  Electronically Signed   By: Lesia Hausen M.D.   On: 06/16/2022 09:42 Note: Reviewed        Physical Exam  General appearance: Well nourished, well developed, and well hydrated. In no apparent acute distress Mental status: Alert, oriented x 3 (person, place, & time)       Respiratory: No evidence of acute respiratory distress Eyes: PERLA Vitals: There were no vitals taken for this visit. BMI: Estimated body mass index is 30.13 kg/m as calculated from the following:   Height as of 09/13/22: 5'  10" (1.778 m).   Weight as of 09/13/22: 210 lb (95.3 kg). Ideal: Ideal body weight: 73 kg (160 lb 15 oz) Adjusted ideal body weight: 81.9 kg (180 lb 9 oz)  Assessment   Diagnosis Status  No diagnosis found. Controlled Controlled Controlled   Updated Problems: No problems updated.  Plan of Care  Problem-specific:  No problem-specific Assessment & Plan notes found  for this encounter.  Mr. Myrle Dues has a current medication list which includes the following long-term medication(s): bumetanide, buprenorphine, [START ON 10/04/2022] buprenorphine, [START ON 11/01/2022] buprenorphine, carvedilol, diclofenac sodium, duloxetine, gabapentin, iron (ferrous sulfate), olanzapine, pantoprazole, and trazodone.  Pharmacotherapy (Medications Ordered): No orders of the defined types were placed in this encounter.  Orders:  No orders of the defined types were placed in this encounter.  Follow-up plan:   No follow-ups on file.      Interventional Therapies  Risk Factors  Considerations:  Patient is legally blind. High risk for substance use disorder.  The patient has documented suicidal attempt using medications.  In addition, we have documented the patient to have a binge use pattern and been noncompliant with the instructions on how to use the medications.  This is a reason why went to a buprenorphine patch.   Planned  Pending:   Therapeutic bilateral IA steroid knee inj.   Under consideration:   Palliative bilateral genicular nerve RFA maintenance treatments   Completed:   Palliative bilateral IA Hyalgan knee injections Sx2  Diagnostic bilateral genicular NB x1  Palliative right genicular nerve RFA x2 (04/06/2020) (100/100/50/100)  Palliative left genicular nerve RFA x4 (04/18/2022) (100/100/30/40)    Completed by other providers:   N/A   Therapeutic  Palliative (PRN) options:   Palliative bilateral IA Hyalgan knee injections S3/N1  Diagnostic bilateral genicular NB #2  Palliative bilateral genicular nerve RFA #3    Pharmacotherapy  Nonopioid transferred 11/02/2020: Voltaren gel        Recent Visits Date Type Provider Dept  09/07/22 Procedure visit Delano Metz, MD [facility]  09/06/22 Office Visit Delano Metz, MD [facility]  Showing recent visits within past 90 days and meeting all other  requirements Future Appointments Date Type Provider Dept  09/28/22 Appointment Delano Metz, MD [facility]  Showing future appointments within next 90 days and meeting all other requirements  I discussed the assessment and treatment plan with the patient. The patient was provided an opportunity to ask questions and all were answered. The patient agreed with the plan and demonstrated an understanding of the instructions.  Patient advised to call back or seek an in-person evaluation if the symptoms or condition worsens.  Duration of encounter: *** minutes.  Total time on encounter, as per AMA guidelines included both the face-to-face and non-face-to-face time personally spent by the physician and/or other qualified health care professional(s) on the day of the encounter (includes time in activities that require the physician or other qualified health care professional and does not include time in activities normally performed by clinical staff). Physician's time may include the following activities when performed: Preparing to see the patient (e.g., pre-charting review of records, searching for previously ordered imaging, lab work, and nerve conduction tests) Review of prior analgesic pharmacotherapies. Reviewing PMP Interpreting ordered tests (e.g., lab work, imaging, nerve conduction tests) Performing post-procedure evaluations, including interpretation of diagnostic procedures Obtaining and/or reviewing separately obtained history Performing a medically appropriate examination and/or evaluation Counseling and educating the patient/family/caregiver Ordering medications, tests, or procedures Referring and  communicating with other health care professionals (when not separately reported) Documenting clinical information in the electronic or other health record Independently interpreting results (not separately reported) and communicating results to the patient/  family/caregiver Care coordination (not separately reported)  Note by: Oswaldo Done, MD Date: 09/28/2022; Time: 6:42 AM

## 2022-09-27 NOTE — Progress Notes (Signed)
PATIENT NAME: Aaron Gibbs DOB: [birth year] MRN: [id number]  PRIMARY CARE PROVIDER: Smitty Cords, DO  RESPONSIBLE PARTY:  Acct ID - Guarantor Home Phone Work Phone Relationship Acct Type  [id number] [name], [name]* [phone]  Self P/F     [address] [place], [place] [address]   Appetite:  No changes reported.  Weight stable.  Cough:  Reports dry cough on occasion.  No shortness of breath reported.  Functional Status:  Continues to use a rolling walker for stability.  No falls reported.   Caregiver continues to assist with showering, dressing, grooming and hygiene.   Hallucinations:  Patient denies any further issues.  Reports this is likely due to his lose of vision.    Pain:  Continues with bilateral knee pain.  Recently had steroid injections with the pain clinic.  Patient did not feel this was helpful.  He will follow up with Pain Clinic next week.  Patient advised he is considering knee replacement if this is still an option.   Skin:  Redness reported to buttocks-using Desitin ointment.  Left arm scab in place.  Using mupirocin to site as needed.  Patient will be following up with dermatology for lesions on face.    Resources:  Call made to [place] with [facility] in [place].  Message left and provided patient name and primary caregiver number in addition to my number.   Follow up visit scheduled for the end of next month.    CODE STATUS: DNR ADVANCED DIRECTIVES: N MOST FORM: No PPS: 40%   PHYSICAL EXAM:   VITALS: Today's Vitals   09/27/22 1414  BP: (!) 150/82  Pulse: 63  Temp: (!) 97.1 F (36.2 C)  SpO2: 93%    LUNGS: clear to auscultation  CARDIAC: Cor RRR}  EXTREMITIES: - for edema SKIN: Skin color, texture, turgor normal. No rashes or lesions or see above   NEURO: positive for gait problems       Truitt Merle, RN

## 2022-09-28 ENCOUNTER — Encounter: Payer: Self-pay | Admitting: Pain Medicine

## 2022-09-28 ENCOUNTER — Ambulatory Visit: Payer: Medicare Other | Attending: Pain Medicine | Admitting: Pain Medicine

## 2022-09-28 ENCOUNTER — Telehealth: Payer: Self-pay

## 2022-09-28 ENCOUNTER — Other Ambulatory Visit: Payer: Self-pay | Admitting: Family Medicine

## 2022-09-28 VITALS — BP 186/104 | HR 87 | Temp 97.5°F | Resp 18 | Ht 70.0 in | Wt 210.0 lb

## 2022-09-28 DIAGNOSIS — M7650 Patellar tendinitis, unspecified knee: Secondary | ICD-10-CM | POA: Diagnosis present

## 2022-09-28 DIAGNOSIS — M7651 Patellar tendinitis, right knee: Secondary | ICD-10-CM | POA: Diagnosis not present

## 2022-09-28 DIAGNOSIS — G8929 Other chronic pain: Secondary | ICD-10-CM | POA: Diagnosis present

## 2022-09-28 DIAGNOSIS — Z79891 Long term (current) use of opiate analgesic: Secondary | ICD-10-CM | POA: Insufficient documentation

## 2022-09-28 DIAGNOSIS — G894 Chronic pain syndrome: Secondary | ICD-10-CM | POA: Insufficient documentation

## 2022-09-28 DIAGNOSIS — M1712 Unilateral primary osteoarthritis, left knee: Secondary | ICD-10-CM | POA: Insufficient documentation

## 2022-09-28 DIAGNOSIS — Z09 Encounter for follow-up examination after completed treatment for conditions other than malignant neoplasm: Secondary | ICD-10-CM | POA: Diagnosis present

## 2022-09-28 DIAGNOSIS — M25561 Pain in right knee: Secondary | ICD-10-CM

## 2022-09-28 DIAGNOSIS — M170 Bilateral primary osteoarthritis of knee: Secondary | ICD-10-CM | POA: Insufficient documentation

## 2022-09-28 DIAGNOSIS — S83242S Other tear of medial meniscus, current injury, left knee, sequela: Secondary | ICD-10-CM

## 2022-09-28 DIAGNOSIS — M25562 Pain in left knee: Secondary | ICD-10-CM | POA: Insufficient documentation

## 2022-09-28 DIAGNOSIS — Z79899 Other long term (current) drug therapy: Secondary | ICD-10-CM | POA: Insufficient documentation

## 2022-09-28 DIAGNOSIS — M1711 Unilateral primary osteoarthritis, right knee: Secondary | ICD-10-CM | POA: Diagnosis present

## 2022-09-28 DIAGNOSIS — S83282S Other tear of lateral meniscus, current injury, left knee, sequela: Secondary | ICD-10-CM | POA: Insufficient documentation

## 2022-09-28 DIAGNOSIS — M545 Low back pain, unspecified: Secondary | ICD-10-CM

## 2022-09-28 DIAGNOSIS — F333 Major depressive disorder, recurrent, severe with psychotic symptoms: Secondary | ICD-10-CM

## 2022-09-28 DIAGNOSIS — M4810 Ankylosing hyperostosis [Forestier], site unspecified: Secondary | ICD-10-CM | POA: Insufficient documentation

## 2022-09-28 MED ORDER — BUPRENORPHINE 10 MCG/HR TD PTWK
1.0000 | MEDICATED_PATCH | TRANSDERMAL | 0 refills | Status: DC
Start: 2022-11-29 — End: 2022-12-26

## 2022-09-28 NOTE — Patient Instructions (Addendum)
______________________________________________________________________  Procedure instructions  Do not eat or drink fluids (other than water) for 6 hours before your procedure  No water for 2 hours before your procedure  Take your blood pressure medicine with a sip of water  Arrive 30 minutes before your appointment  Carefully read the "Preparing for your procedure" detailed instructions  If you have questions call us at [phone]  _____________________________________________________________________    ______________________________________________________________________  Preparing for your procedure  Appointments: If you think you may not be able to keep your appointment, call 24-48 hours in advance to cancel. We need time to make it available to others.  During your procedure appointment there will be: No Prescription Refills. No disability issues to discussed. No medication changes or discussions.  Instructions: Food intake: Avoid eating anything solid for at least 8 hours prior to your procedure. Clear liquid intake: You may take clear liquids such as water up to 2 hours prior to your procedure. (No carbonated drinks. No soda.) Transportation: Unless otherwise stated by your physician, bring a driver. Morning Medicines: Except for blood thinners, take all of your other morning medications with a sip of water. Make sure to take your heart and blood pressure medicines. If your blood pressure's lower number is above 100, the case will be rescheduled. Blood thinners: Make sure to stop your blood thinners as instructed.  If you take a blood thinner, but were not instructed to stop it, call our office [phone] and ask to talk to a nurse. Not stopping a blood thinner prior to certain procedures could lead to serious complications. Diabetics on insulin: Notify the staff so that you can be scheduled 1st case in the morning. If your diabetes requires high dose insulin,  take only  of your normal insulin dose the morning of the procedure and notify the staff that you have done so. Preventing infections: Shower with an antibacterial soap the morning of your procedure.  Build-up your immune system: Take 1000 mg of Vitamin C with every meal (3 times a day) the day prior to your procedure. Antibiotics: Inform the nursing staff if you are taking any antibiotics or if you have any conditions that may require antibiotics prior to procedures. (Example: recent joint implants)   Pregnancy: If you are pregnant make sure to notify the nursing staff. Not doing so may result in injury to the fetus, including death.  Sickness: If you have a cold, fever, or any active infections, call and cancel or reschedule your procedure. Receiving steroids while having an infection may result in complications. Arrival: You must be in the facility at least 30 minutes prior to your scheduled procedure. Tardiness: Your scheduled time is also the cutoff time. If you do not arrive at least 15 minutes prior to your procedure, you will be rescheduled.  Children: Do not bring any children with you. Make arrangements to keep them home. Dress appropriately: There is always a possibility that your clothing may get soiled. Avoid long dresses. Valuables: Do not bring any jewelry or valuables.  Reasons to call and reschedule or cancel your procedure: (Following these recommendations will minimize the risk of a serious complication.) Surgeries: Avoid having procedures within 2 weeks of any surgery. (Avoid for 2 weeks before or after any surgery). Flu Shots: Avoid having procedures within 2 weeks of a flu shots or . (Avoid for 2 weeks before or after immunizations). Barium: Avoid having a procedure within 7-10 days after having had a radiological study involving the use of   radiological contrast. (Myelograms, Barium swallow or enema study). Heart attacks: Avoid any elective procedures or surgeries for the  initial 6 months after a "Myocardial Infarction" (Heart Attack). Blood thinners: It is imperative that you stop these medications before procedures. Let us know if you if you take any blood thinner.  Infection: Avoid procedures during or within two weeks of an infection (including chest colds or gastrointestinal problems). Symptoms associated with infections include: Localized redness, fever, chills, night sweats or profuse sweating, burning sensation when voiding, cough, congestion, stuffiness, runny nose, sore throat, diarrhea, nausea, vomiting, cold or Flu symptoms, recent or current infections. It is specially important if the infection is over the area that we intend to treat. Heart and lung problems: Symptoms that may suggest an active cardiopulmonary problem include: cough, chest pain, breathing difficulties or shortness of breath, dizziness, ankle swelling, uncontrolled high or unusually low blood pressure, and/or palpitations. If you are experiencing any of these symptoms, cancel your procedure and contact your primary care physician for an evaluation.  Remember:  Regular Business hours are:  Monday to Thursday 8:00 AM to 4:00 PM  Provider's Schedule: Florene Brill, MD:  Procedure days: Tuesday and Thursday 7:30 AM to 4:00 PM  Bilal Lateef, MD:  Procedure days: Monday and Wednesday 7:30 AM to 4:00 PM  ______________________________________________________________________    ____________________________________________________________________________________________  General Risks and Possible Complications  Patient Responsibilities: It is important that you read this as it is part of your informed consent. It is our duty to inform you of the risks and possible complications associated with treatments offered to you. It is your responsibility as a patient to read this and to ask questions about anything that is not clear or that you believe was not covered in this  document.  Patient's Rights: You have the right to refuse treatment. You also have the right to change your mind, even after initially having agreed to have the treatment done. However, under this last option, if you wait until the last second to change your mind, you may be charged for the materials used up to that point.  Introduction: Medicine is not an exact science. Everything in Medicine, including the lack of treatment(s), carries the potential for danger, harm, or loss (which is by definition: Risk). In Medicine, a complication is a secondary problem, condition, or disease that can aggravate an already existing one. All treatments carry the risk of possible complications. The fact that a side effects or complications occurs, does not imply that the treatment was conducted incorrectly. It must be clearly understood that these can happen even when everything is done following the highest safety standards.  No treatment: You can choose not to proceed with the proposed treatment alternative. The "PRO(s)" would include: avoiding the risk of complications associated with the therapy. The "CON(s)" would include: not getting any of the treatment benefits. These benefits fall under one of three categories: diagnostic; therapeutic; and/or palliative. Diagnostic benefits include: getting information which can ultimately lead to improvement of the disease or symptom(s). Therapeutic benefits are those associated with the successful treatment of the disease. Finally, palliative benefits are those related to the decrease of the primary symptoms, without necessarily curing the condition (example: decreasing the pain from a flare-up of a chronic condition, such as incurable terminal cancer).  General Risks and Complications: These are associated to most interventional treatments. They can occur alone, or in combination. They fall under one of the following six (6) categories: no benefit or worsening of symptoms;    bleeding; infection; nerve damage; allergic reactions; and/or death. No benefits or worsening of symptoms: In Medicine there are no guarantees, only probabilities. No healthcare provider can ever guarantee that a medical treatment will work, they can only state the probability that it may. Furthermore, there is always the possibility that the condition may worsen, either directly, or indirectly, as a consequence of the treatment. Bleeding: This is more common if the patient is taking a blood thinner, either prescription or over the counter (example: Goody Powders, Fish oil, Aspirin, Garlic, etc.), or if suffering a condition associated with impaired coagulation (example: Hemophilia, cirrhosis of the liver, low platelet counts, etc.). However, even if you do not have one on these, it can still happen. If you have any of these conditions, or take one of these drugs, make sure to notify your treating physician. Infection: This is more common in patients with a compromised immune system, either due to disease (example: diabetes, cancer, human immunodeficiency virus [HIV], etc.), or due to medications or treatments (example: therapies used to treat cancer and rheumatological diseases). However, even if you do not have one on these, it can still happen. If you have any of these conditions, or take one of these drugs, make sure to notify your treating physician. Nerve Damage: This is more common when the treatment is an invasive one, but it can also happen with the use of medications, such as those used in the treatment of cancer. The damage can occur to small secondary nerves, or to large primary ones, such as those in the spinal cord and brain. This damage may be temporary or permanent and it may lead to impairments that can range from temporary numbness to permanent paralysis and/or brain death. Allergic Reactions: Any time a substance or material comes in contact with our body, there is the possibility of an  allergic reaction. These can range from a mild skin rash (contact dermatitis) to a severe systemic reaction (anaphylactic reaction), which can result in death. Death: In general, any medical intervention can result in death, most of the time due to an unforeseen complication. ____________________________________________________________________________________________    ____________________________________________________________________________________________  Opioid Pain Medication Update  To: All patients taking opioid pain medications. (I.e.: hydrocodone, hydromorphone, oxycodone, oxymorphone, morphine, codeine, methadone, tapentadol, tramadol, buprenorphine, fentanyl, etc.)  Re: Updated review of side effects and adverse reactions of opioid analgesics, as well as new information about long term effects of this class of medications.  Direct risks of long-term opioid therapy are not limited to opioid addiction and overdose. Potential medical risks include serious fractures, breathing problems during sleep, hyperalgesia, immunosuppression, chronic constipation, bowel obstruction, myocardial infarction, and tooth decay secondary to xerostomia.  Unpredictable adverse effects that can occur even if you take your medication correctly: Cognitive impairment, respiratory depression, and death. Most people think that if they take their medication "correctly", and "as instructed", that they will be safe. Nothing could be farther from the truth. In reality, a significant amount of recorded deaths associated with the use of opioids has occurred in individuals that had taken the medication for a long time, and were taking their medication correctly. The following are examples of how this can happen: Patient taking his/her medication for a long time, as instructed, without any side effects, is given a certain antibiotic or another unrelated medication, which in turn triggers a "Drug-to-drug interaction"  leading to disorientation, cognitive impairment, impaired reflexes, respiratory depression or an untoward event leading to serious bodily harm or injury, including death.  Patient taking   his/her medication for a long time, as instructed, without any side effects, develops an acute impairment of liver and/or kidney function. This will lead to a rapid inability of the body to breakdown and eliminate their pain medication, which will result in effects similar to an "overdose", but with the same medicine and dose that they had always taken. This again may lead to disorientation, cognitive impairment, impaired reflexes, respiratory depression or an untoward event leading to serious bodily harm or injury, including death.  A similar problem will occur with patients as they grow older and their liver and kidney function begins to decrease as part of the aging process.  Background information: Historically, the original case for using long-term opioid therapy to treat chronic noncancer pain was based on safety assumptions that subsequent experience has called into question. In 1996, the American Pain Society and the American Academy of Pain Medicine issued a consensus statement supporting long-term opioid therapy. This statement acknowledged the dangers of opioid prescribing but concluded that the risk for addiction was low; respiratory depression induced by opioids was short-lived, occurred mainly in opioid-naive patients, and was antagonized by pain; tolerance was not a common problem; and efforts to control diversion should not constrain opioid prescribing. This has now proven to be wrong. Experience regarding the risks for opioid addiction, misuse, and overdose in community practice has failed to support these assumptions.  According to the Centers for Disease Control and Prevention, fatal overdoses involving opioid analgesics have increased sharply over the past decade. Currently, more than 96,700 people die  from drug overdoses every year. Opioids are a factor in 7 out of every 10 overdose deaths. Deaths from drug overdose have surpassed motor vehicle accidents as the leading cause of death for individuals between the ages of 35 and 54.  Clinical data suggest that neuroendocrine dysfunction may be very common in both men and women, potentially causing hypogonadism, erectile dysfunction, infertility, decreased libido, osteoporosis, and depression. Recent studies linked higher opioid dose to increased opioid-related mortality. Controlled observational studies reported that long-term opioid therapy may be associated with increased risk for cardiovascular events. Subsequent meta-analysis concluded that the safety of long-term opioid therapy in elderly patients has not been proven.   Side Effects and adverse reactions: Common side effects: Drowsiness (sedation). Dizziness. Nausea and vomiting. Constipation. Physical dependence -- Dependence often manifests with withdrawal symptoms when opioids are discontinued or decreased. Tolerance -- As you take repeated doses of opioids, you require increased medication to experience the same effect of pain relief. Respiratory depression -- This can occur in healthy people, especially with higher doses. However, people with COPD, asthma or other lung conditions may be even more susceptible to fatal respiratory impairment.  Uncommon side effects: An increased sensitivity to feeling pain and extreme response to pain (hyperalgesia). Chronic use of opioids can lead to this. Delayed gastric emptying (the process by which the contents of your stomach are moved into your small intestine). Muscle rigidity. Immune system and hormonal dysfunction. Quick, involuntary muscle jerks (myoclonus). Arrhythmia. Itchy skin (pruritus). Dry mouth (xerostomia).  Long-term side effects: Chronic constipation. Sleep-disordered breathing (SDB). Increased risk of bone  fractures. Hypothalamic-pituitary-adrenal dysregulation. Increased risk of overdose.  RISKS: Fractures and Falls:  Opioids increase the risk and incidence of falls. This is of particular importance in elderly patients.  Endocrine System:  Long-term administration is associated with endocrine abnormalities (endocrinopathies). (Also known as Opioid-induced Endocrinopathy) Influences on both the hypothalamic-pituitary-adrenal axis?and the hypothalamic-pituitary-gonadal axis have been demonstrated with consequent hypogonadism and adrenal   insufficiency in both sexes. Hypogonadism and decreased levels of dehydroepiandrosterone sulfate have been reported in men and women. Endocrine effects include: Amenorrhoea in women (abnormal absence of menstruation) Reduced libido in both sexes Decreased sexual function Erectile dysfunction in men Hypogonadisms (decreased testicular function with shrinkage of testicles) Infertility Depression and fatigue Loss of muscle mass Anxiety Depression Immune suppression Hyperalgesia Weight gain Anemia Osteoporosis Patients (particularly women of childbearing age) should avoid opioids. There is insufficient evidence to recommend routine monitoring of asymptomatic patients taking opioids in the long-term for hormonal deficiencies.  Immune System: Human studies have demonstrated that opioids have an immunomodulating effect. These effects are mediated via opioid receptors both on immune effector cells and in the central nervous system. Opioids have been demonstrated to have adverse effects on antimicrobial response and anti-tumour surveillance. Buprenorphine has been demonstrated to have no impact on immune function.  Opioid Induced Hyperalgesia: Human studies have demonstrated that prolonged use of opioids can lead to a state of abnormal pain sensitivity, sometimes called opioid induced hyperalgesia (OIH). Opioid induced hyperalgesia is not usually seen in the  absence of tolerance to opioid analgesia. Clinically, hyperalgesia may be diagnosed if the patient on long-term opioid therapy presents with increased pain. This might be qualitatively and anatomically distinct from pain related to disease progression or to breakthrough pain resulting from development of opioid tolerance. Pain associated with hyperalgesia tends to be more diffuse than the pre-existing pain and less defined in quality. Management of opioid induced hyperalgesia requires opioid dose reduction.  Cancer: Chronic opioid therapy has been associated with an increased risk of cancer among noncancer patients with chronic pain. This association was more evident in chronic strong opioid users. Chronic opioid consumption causes significant pathological changes in the small intestine and colon. Epidemiological studies have found that there is a link between opium dependence and initiation of gastrointestinal cancers. Cancer is the second leading cause of death after cardiovascular disease. Chronic use of opioids can cause multiple conditions such as GERD, immunosuppression and renal damage as well as carcinogenic effects, which are associated with the incidence of cancers.   Mortality: Long-term opioid use has been associated with increased mortality among patients with chronic non-cancer pain (CNCP).  Prescription of long-acting opioids for chronic noncancer pain was associated with a significantly increased risk of all-cause mortality, including deaths from causes other than overdose.  Reference: Von Korff M, Kolodny A, Deyo RA, Chou R. Long-term opioid therapy reconsidered. Ann Intern Med. 2011 Sep 6;155(5):325-8. doi: 10.7326/[id number]. PMID: 21893626; PMCID: PMC3280085. Bedson J, Chen Y, Ashworth J, Hayward RA, Dunn KM, Jordan KP. Risk of adverse events in patients prescribed long-term opioids: A cohort study in the [place] Clinical Practice Research D[employer]. Eur J Pain. 2019  May;23(5):908-922. doi: 10.1002/ejp.1357. Epub 2019 Jan 31. PMID: 30620116. Colameco S, Coren JS, Ciervo CA. Continuous opioid treatment for chronic noncancer pain: a time for moderation in prescribing. Postgrad Med. 2009 Jul;121(4):61-6. doi: 10.3810/pgm.2009.07.2032. PMID: 19641271. Chou R, Turner JA, Devine EB, Hansen RN, Sullivan SD, Blazina I, Dana T, Bougatsos C, Deyo RA. The effectiveness and risks of long-term opioid therapy for chronic pain: a systematic review for a National Institutes of Health Pathways to Prevention Workshop. Ann Intern Med. 2015 Feb 17;162(4):276-86. doi: 10.7326/M14-2559. PMID: 25581257. Warner M, Chen LH, Makuc DM. NCHS Data Brief No. 22. [place]: Centers for Disease Control and Prevention; 2009. Sep, Increase in Fatal Poisonings Involving Opioid Analgesics in the [place], 1999-2006. Song IA, Choi HR, Oh TK. Long-term opioid use and mortality in patients with   chronic non-cancer pain: Ten-year follow-up study in [place] from 2010 through 2019. EClinicalMedicine. 2022 Jul 18;51:101558. doi: 10.1016/j.eclinm.2022.101558. PMID: 35875817; PMCID: PMC9304910. Huser, W., Schubert, T., Vogelmann, T. et al. All-cause mortality in patients with long-term opioid therapy compared with non-opioid analgesics for chronic non-cancer pain: a database study. BMC Med 18, 162 (2020). https://doi.org/10.1186/[email or web address] Rashidian H, Zendehdel K, Kamangar F, Malekzadeh R, Haghdoost AA. An Ecological Study of the Association between Opiate Use and Incidence of Cancers. Addict Health. 2016 Fall;8(4):252-260. PMID: 28819556; PMCID: PMC5554805.  Our Goal: Our goal is to control your pain with means other than the use of opioid pain medications.  Our Recommendation: Talk to your physician about coming off of these medications. We can assist you with the tapering down and stopping these medicines. Based on the new information, even if you cannot completely stop the medication, a  decrease in the dose may be associated with a lesser risk. Ask for other means of controlling the pain. Decrease or eliminate those factors that significantly contribute to your pain such as smoking, obesity, and a diet heavily tilted towards "inflammatory" nutrients.  Last Updated: 08/16/2022   ____________________________________________________________________________________________     ____________________________________________________________________________________________  Patient Information update  To: All of our patients.  Re: Name change.  It has been made official that our current name, "[facility]"   will soon be changed to "[facility]".   The purpose of this change is to eliminate any confusion created by the concept of our practice being a "Medication Management Pain Clinic". In the past this has led to the misconception that we treat pain primarily by the use of prescription medications.  Nothing can be farther from the truth.   Understanding PAIN MANAGEMENT: To further understand what our practice does, you first have to understand that "Pain Management" is a subspecialty that requires additional training once a physician has completed their specialty training, which can be in either Anesthesia, Neurology, Psychiatry, or Physical Medicine and Rehabilitation (PMR). Each one of these contributes to the final approach taken by each physician to the management of their patient's pain. To be a "Pain Management Specialist" you must have first completed one of the specialty trainings below.  Anesthesiologists - trained in clinical pharmacology and interventional techniques such as nerve blockade and regional as well as central neuroanatomy. They are trained to block pain before, during, and after surgical interventions.  Neurologists - trained in the diagnosis and  pharmacological treatment of complex neurological conditions, such as Multiple Sclerosis, Parkinson's, spinal cord injuries, and other systemic conditions that may be associated with symptoms that may include but are not limited to pain. They tend to rely primarily on the treatment of chronic pain using prescription medications.  Psychiatrist - trained in conditions affecting the psychosocial wellbeing of patients including but not limited to depression, anxiety, schizophrenia, personality disorders, addiction, and other substance use disorders that may be associated with chronic pain. They tend to rely primarily on the treatment of chronic pain using prescription medications.   Physical Medicine and Rehabilitation (PMR) physicians, also known as physiatrists - trained to treat a wide variety of medical conditions affecting the brain, spinal cord, nerves, bones, joints, ligaments, muscles, and tendons. Their training is primarily aimed at treating patients that have suffered injuries that have caused severe physical impairment. Their training is primarily aimed at the physical therapy and rehabilitation of those patients. They may also work alongside orthopedic surgeons or neurosurgeons   using their expertise in assisting surgical patients to recover after their surgeries.  INTERVENTIONAL PAIN MANAGEMENT is sub-subspecialty of Pain Management.  Our physicians are Board-certified in Anesthesia, Pain Management, and Interventional Pain Management.  This meaning that not only have they been trained and Board-certified in their specialty of Anesthesia, and subspecialty of Pain Management, but they have also received further training in the sub-subspecialty of Interventional Pain Management, in order to become Board-certified as INTERVENTIONAL PAIN MANAGEMENT SPECIALIST.    Mission: Our goal is to use our skills in  INTERVENTIONAL PAIN MANAGEMENT as alternatives to the chronic use of prescription opioid  medications for the treatment of pain. To make this more clear, we have changed our name to reflect what we do and offer. We will continue to offer medication management assessment and recommendations, but we will not be taking over any patient's medication management.  ____________________________________________________________________________________________     ____________________________________________________________________________________________  National Pain Medication Shortage  [place] experiencing worsening drug shortages. These have had a negative widespread effect on patient care and treatment. Not expected to improve any time soon. Predicted to last past 2029.   Drug shortage list (generic names) Oxycodone IR Oxycodone/APAP Oxymorphone IR Hydromorphone Hydrocodone/APAP Morphine  Where is the problem?  Manufacturing and supply level.  Will this shortage affect you?  Only if you take any of the above pain medications.  How? You may be unable to fill your prescription.  Your pharmacist may offer a "partial fill" of your prescription. (Warning: Do not accept partial fills.) Prescriptions partially filled cannot be transferred to another pharmacy. Read our Medication Rules and Regulation. Depending on how much medicine you are dependent on, you may experience withdrawals when unable to get the medication.  Recommendations: Consider ending your dependence on opioid pain medications. Ask your pain specialist to assist you with the process. Consider switching to a medication currently not in shortage, such as Buprenorphine. Talk to your pain specialist about this option. Consider decreasing your pain medication requirements by managing tolerance thru "Drug Holidays". This may help minimize withdrawals, should you run out of medicine. Control your pain thru the use of non-pharmacological interventional therapies.   Your prescriber: Prescribers cannot be blamed for  shortages. Medication manufacturing and supply issues cannot be fixed by the prescriber.   NOTE: The prescriber is not responsible for supplying the medication, or solving supply issues. Work with your pharmacist to solve it. The patient is responsible for the decision to take or continue taking the medication and for identifying and securing a legal supply source. By law, supplying the medication is the job and responsibility of the pharmacy. The prescriber is responsible for the evaluation, monitoring, and prescribing of these medications.   Prescribers will NOT: Re-issue prescriptions that have been partially filled. Re-issue prescriptions already sent to a pharmacy.  Re-send prescriptions to a different pharmacy because yours did not have your medication. Ask pharmacist to order more medicine or transfer the prescription to another pharmacy. (Read below.)  New 2023 regulation: "February 17, 2022 Revised Regulation Allows DEA-Registered Pharmacies to Transfer Electronic Prescriptions at a Patient's Request DEA Headquarters Division - Public Information Office Patients now have the ability to request their electronic prescription be transferred to another pharmacy without having to go back to their practitioner to initiate the request. This revised regulation went into effect on Monday, February 13, 2022.     At a patient's request, a DEA-registered retail pharmacy can now transfer an electronic prescription for a controlled substance (schedules   II-V) to another DEA-registered retail pharmacy. Prior to this change, patients would have to go through their practitioner to cancel their prescription and have it re-issued to a different pharmacy. The process was taxing and time consuming for both patients and practitioners.    The Drug Enforcement Administration (DEA) published its intent to revise the process for transferring electronic prescriptions on May 07, 2020.  The final rule was published  in the federal register on January 12, 2022 and went into effect 30 days later.  Under the final rule, a prescription can only be transferred once between pharmacies, and only if allowed under existing state or other applicable law. The prescription must remain in its electronic form; may not be altered in any way; and the transfer must be communicated directly between two licensed pharmacists. It's important to note, any authorized refills transfer with the original prescription, which means the entire prescription will be filled at the same pharmacy".  Reference: https://www.dea.gov/stories/2023/2023-02/2022-09-01/revised-regulation-allows-dea-registered-pharmacies-transfer (DEA website announcement)  https://www.govinfo.gov/content/pkg/FR-2022-01-12/pdf/2023-15847.pdf (Federal Register  Department of Justice)   Federal Register / Vol. 88, No. 143 / Thursday, January 12, 2022 / Rules and Regulations DEPARTMENT OF JUSTICE  Drug Enforcement Administration  21 CFR Part 1306  [Docket No. [id number]]  RIN 1117-AB64 Transfer of Electronic Prescriptions for Schedules II-V Controlled Substances Between Pharmacies for Initial Filling  ____________________________________________________________________________________________     ____________________________________________________________________________________________  Transfer of Pain Medication between Pharmacies  Re: 2023 DEA Clarification on existing regulation  Published on DEA Website: February 17, 2022  Title: Revised Regulation Allows DEA-Registered Pharmacies to Transfer Electronic Prescriptions at a Patient's Request DEA Headquarters Division - Public Information Office  "Patients now have the ability to request their electronic prescription be transferred to another pharmacy without having to go back to their practitioner to initiate the request. This revised regulation went into effect on Monday, February 13, 2022.     At a patient's  request, a DEA-registered retail pharmacy can now transfer an electronic prescription for a controlled substance (schedules II-V) to another DEA-registered retail pharmacy. Prior to this change, patients would have to go through their practitioner to cancel their prescription and have it re-issued to a different pharmacy. The process was taxing and time consuming for both patients and practitioners.    The Drug Enforcement Administration (DEA) published its intent to revise the process for transferring electronic prescriptions on May 07, 2020.  The final rule was published in the federal register on January 12, 2022 and went into effect 30 days later.  Under the final rule, a prescription can only be transferred once between pharmacies, and only if allowed under existing state or other applicable law. The prescription must remain in its electronic form; may not be altered in any way; and the transfer must be communicated directly between two licensed pharmacists. It's important to note, any authorized refills transfer with the original prescription, which means the entire prescription will be filled at the same pharmacy."    REFERENCES: 1. DEA website announcement https://www.dea.gov/stories/2023/2023-02/2022-09-01/revised-regulation-allows-dea-registered-pharmacies-transfer  2. Department of Justice website  https://www.govinfo.gov/content/pkg/FR-2022-01-12/pdf/2023-15847.pdf  3. DEPARTMENT OF JUSTICE Drug Enforcement Administration 21 CFR Part 1306 [Docket No. [id number]] RIN 1117-AB64 "Transfer of Electronic Prescriptions for Schedules II-V Controlled Substances Between Pharmacies for Initial Filling"  ____________________________________________________________________________________________     _______________________________________________________________________  Medication Rules  Purpose: To inform patients, and their family members, of our medication rules and  regulations.  Applies to: All patients receiving prescriptions from our practice (written or electronic).  Pharmacy of record: This is the pharmacy where your   electronic prescriptions will be sent. Make sure we have the correct one.  Electronic prescriptions: In compliance with the [place] Strengthen Opioid Misuse Prevention (STOP) Act of 2017 (Session Law 2017-74/H243), effective June 19, 2018, all controlled substances must be electronically prescribed. Written prescriptions, faxing, or calling prescriptions to a pharmacy will no longer be done.  Prescription refills: These will be provided only during in-person appointments. No medications will be renewed without a "face-to-face" evaluation with your provider. Applies to all prescriptions.  NOTE: The following applies primarily to controlled substances (Opioid* Pain Medications).   Type of encounter (visit): For patients receiving controlled substances, face-to-face visits are required. (Not an option and not up to the patient.)  Patient's responsibilities: Pain Pills: Bring all pain pills to every appointment (except for procedure appointments). Pill Bottles: Bring pills in original pharmacy bottle. Bring bottle, even if empty. Always bring the bottle of the most recent fill.  Medication refills: You are responsible for knowing and keeping track of what medications you are taking and when is it that you will need a refill. The day before your appointment: write a list of all prescriptions that need to be refilled. The day of the appointment: give the list to the admitting nurse. Prescriptions will be written only during appointments. No prescriptions will be written on procedure days. If you forget a medication: it will not be "Called in", "Faxed", or "electronically sent". You will need to get another appointment to get these prescribed. No early refills. Do not call asking to have your prescription filled early. Partial  or short  prescriptions: Occasionally your pharmacy may not have enough pills to fill your prescription.  NEVER ACCEPT a partial fill or a prescription that is short of the total amount of pills that you were prescribed.  With controlled substances the law allows 72 hours for the pharmacy to complete the prescription.  If the prescription is not completed within 72 hours, the pharmacist will require a new prescription to be written. This means that you will be short on your medicine and we WILL NOT send another prescription to complete your original prescription.  Instead, request the pharmacy to send a carrier to a nearby branch to get enough medication to provide you with your full prescription. Prescription Accuracy: You are responsible for carefully inspecting your prescriptions before leaving our office. Have the discharge nurse carefully go over each prescription with you, before taking them home. Make sure that your name is accurately spelled, that your address is correct. Check the name and dose of your medication to make sure it is accurate. Check the number of pills, and the written instructions to make sure they are clear and accurate. Make sure that you are given enough medication to last until your next medication refill appointment. Taking Medication: Take medication as prescribed. When it comes to controlled substances, taking less pills or less frequently than prescribed is permitted and encouraged. Never take more pills than instructed. Never take the medication more frequently than prescribed.  Inform other Doctors: Always inform, all of your healthcare providers, of all the medications you take. Pain Medication from other Providers: You are not allowed to accept any additional pain medication from any other Doctor or Healthcare provider. There are two exceptions to this rule. (see below) In the event that you require additional pain medication, you are responsible for notifying us, as stated  below. Cough Medicine: Often these contain an opioid, such as codeine or hydrocodone. Never accept or take cough   medicine containing these opioids if you are already taking an opioid* medication. The combination may cause respiratory failure and death. Medication Agreement: You are responsible for carefully reading and following our Medication Agreement. This must be signed before receiving any prescriptions from our practice. Safely store a copy of your signed Agreement. Violations to the Agreement will result in no further prescriptions. (Additional copies of our Medication Agreement are available upon request.) Laws, Rules, & Regulations: All patients are expected to follow all Federal and State Laws, Statutes, Rules, & Regulations. Ignorance of the Laws does not constitute a valid excuse.  Illegal drugs and Controlled Substances: The use of illegal substances (including, but not limited to marijuana and its derivatives) and/or the illegal use of any controlled substances is strictly prohibited. Violation of this rule may result in the immediate and permanent discontinuation of any and all prescriptions being written by our practice. The use of any illegal substances is prohibited. Adopted CDC guidelines & recommendations: Target dosing levels will be at or below 60 MME/day. Use of benzodiazepines** is not recommended.  Exceptions: There are only two exceptions to the rule of not receiving pain medications from other Healthcare Providers. Exception #1 (Emergencies): In the event of an emergency (i.e.: accident requiring emergency care), you are allowed to receive additional pain medication. However, you are responsible for: As soon as you are able, call our office [phone], at any time of the day or night, and leave a message stating your name, the date and nature of the emergency, and the name and dose of the medication prescribed. In the event that your call is answered by a member of our staff,  make sure to document and save the date, time, and the name of the person that took your information.  Exception #2 (Planned Surgery): In the event that you are scheduled by another doctor or dentist to have any type of surgery or procedure, you are allowed (for a period no longer than 30 days), to receive additional pain medication, for the acute post-op pain. However, in this case, you are responsible for picking up a copy of our "Post-op Pain Management for Surgeons" handout, and giving it to your surgeon or dentist. This document is available at our office, and does not require an appointment to obtain it. Simply go to our office during business hours (Monday-Thursday from 8:00 AM to 4:00 PM) (Friday 8:00 AM to 12:00 Noon) or if you have a scheduled appointment with us, prior to your surgery, and ask for it by name. In addition, you are responsible for: calling our office [phone], at any time of the day or night, and leaving a message stating your name, name of your surgeon, type of surgery, and date of procedure or surgery. Failure to comply with your responsibilities may result in termination of therapy involving the controlled substances. Medication Agreement Violation. Following the above rules, including your responsibilities will help you in avoiding a Medication Agreement Violation ("Breaking your Pain Medication Contract").  Consequences:  Not following the above rules may result in permanent discontinuation of medication prescription therapy.  *Opioid medications include: morphine, codeine, oxycodone, oxymorphone, hydrocodone, hydromorphone, meperidine, tramadol, tapentadol, buprenorphine, fentanyl, methadone. **Benzodiazepine medications include: diazepam (Valium), alprazolam (Xanax), clonazepam (Klonopine), lorazepam (Ativan), clorazepate (Tranxene), chlordiazepoxide (Librium), estazolam (Prosom), oxazepam (Serax), temazepam (Restoril), triazolam (Halcion) (Last updated:  04/11/2022) ______________________________________________________________________    ______________________________________________________________________  Medication Recommendations and Reminders  Applies to: All patients receiving prescriptions (written and/or electronic).  Medication Rules & Regulations: You   are responsible for reading, knowing, and following our "Medication Rules" document. These exist for your safety and that of others. They are not flexible and neither are we. Dismissing or ignoring them is an act of "non-compliance" that may result in complete and irreversible termination of such medication therapy. For safety reasons, "non-compliance" will not be tolerated. As with the [place] fundamental legal principle of "ignorance of the law is no defense", we will accept no excuses for not having read and knowing the content of documents provided to you by our practice.  Pharmacy of record:  Definition: This is the pharmacy where your electronic prescriptions will be sent.  We do not endorse any particular pharmacy. It is up to you and your insurance to decide what pharmacy to use.  We do not restrict you in your choice of pharmacy. However, once we write for your prescriptions, we will NOT be re-sending more prescriptions to fix restricted supply problems created by your pharmacy, or your insurance.  The pharmacy listed in the electronic medical record should be the one where you want electronic prescriptions to be sent. If you choose to change pharmacy, simply notify our nursing staff. Changes will be made only during your regular appointments and not over the phone.  Recommendations: Keep all of your pain medications in a safe place, under lock and key, even if you live alone. We will NOT replace lost, stolen, or damaged medication. We do not accept "Police Reports" as proof of medications having been stolen. After you fill your prescription, take 1 week's worth of pills and put  them away in a safe place. You should keep a separate, properly labeled bottle for this purpose. The remainder should be kept in the original bottle. Use this as your primary supply, until it runs out. Once it's gone, then you know that you have 1 week's worth of medicine, and it is time to come in for a prescription refill. If you do this correctly, it is unlikely that you will ever run out of medicine. To make sure that the above recommendation works, it is very important that you make sure your medication refill appointments are scheduled at least 1 week before you run out of medicine. To do this in an effective manner, make sure that you do not leave the office without scheduling your next medication management appointment. Always ask the nursing staff to show you in your prescription , when your medication will be running out. Then arrange for the receptionist to get you a return appointment, at least 7 days before you run out of medicine. Do not wait until you have 1 or 2 pills left, to come in. This is very poor planning and does not take into consideration that we may need to cancel appointments due to bad weather, sickness, or emergencies affecting our staff. DO NOT ACCEPT A "Partial Fill": If for any reason your pharmacy does not have enough pills/tablets to completely fill or refill your prescription, do not allow for a "partial fill". The law allows the pharmacy to complete that prescription within 72 hours, without requiring a new prescription. If they do not fill the rest of your prescription within those 72 hours, you will need a separate prescription to fill the remaining amount, which we will NOT provide. If the reason for the partial fill is your insurance, you will need to talk to the pharmacist about payment alternatives for the remaining tablets, but again, DO NOT ACCEPT A PARTIAL FILL, unless you can   trust your pharmacist to obtain the remainder of the pills within 72 hours.  Prescription  refills and/or changes in medication(s):  Prescription refills, and/or changes in dose or medication, will be conducted only during scheduled medication management appointments. (Applies to both, written and electronic prescriptions.) No refills on procedure days. No medication will be changed or started on procedure days. No changes, adjustments, and/or refills will be conducted on a procedure day. Doing so will interfere with the diagnostic portion of the procedure. No phone refills. No medications will be "called into the pharmacy". No Fax refills. No weekend refills. No Holliday refills. No after hours refills.  Remember:  Business hours are:  Monday to Thursday 8:00 AM to 4:00 PM Provider's Schedule: Helayne Metsker, MD - Appointments are:  Medication management: Monday and Wednesday 8:00 AM to 4:00 PM Procedure day: Tuesday and Thursday 7:30 AM to 4:00 PM Bilal Lateef, MD - Appointments are:  Medication management: Tuesday and Thursday 8:00 AM to 4:00 PM Procedure day: Monday and Wednesday 7:30 AM to 4:00 PM (Last update: 04/11/2022) ______________________________________________________________________    ____________________________________________________________________________________________  Drug Holidays  What is a "Drug Holiday"? Drug Holiday: is the name given to the process of slowly tapering down and temporarily stopping the pain medication for the purpose of decreasing or eliminating tolerance to the drug.  Benefits Improved effectiveness Decreased required effective dose Improved pain control End dependence on high dose therapy Decrease cost of therapy Uncovering "opioid-induced hyperalgesia". (OIH)  What is "opioid hyperalgesia"? It is a paradoxical increase in pain caused by exposure to opioids. Stopping the opioid pain medication, contrary to the expected, it actually decreases or completely eliminates the pain. Ref.: "A comprehensive review of  opioid-induced hyperalgesia". Marion Lee, et.al. Pain Physician. 2011 Mar-Apr;14(2):145-61.  What is tolerance? Tolerance: the progressive loss of effectiveness of a pain medicine due to repetitive use. A common problem of opioid pain medications.  How long should a "Drug Holiday" last? Effectiveness depends on the patient staying off all opioid pain medicines for a minimum of 14 consecutive days. (2 weeks)  How about just taking less of the medicine? Does not work. Will not accomplish goal of eliminating the excess receptors.  How about switching to a different pain medicine? (AKA. "Opioid rotation") Does not work. Creates the illusion of effectiveness by taking advantage of inaccurate equivalent dose calculations between different opioids. -This "technique" was promoted by studies funded by pharmaceutical companies, such as [employer], creators of "OxyContin".  Can I stop the medicine "cold turkey"? We do not recommend it. You should always coordinate with your prescribing physician to make the transition as smoothly as possible. Avoid stopping the medicine abruptly without consulting. We recommend a "slow taper".  What is a slow taper? Taper: refers to the gradual decrease in dose.   How do I stop/taper the dose? Slowly. Decrease the daily amount of pills that you take by one (1) pill every seven (7) days. This is called a "slow downward taper". Example: if you normally take four (4) pills per day, drop it to three (3) pills per day for seven (7) days, then to two (2) pills per day for seven (7) days, then to one (1) per day for seven (7) days, and then stop the medicine. The 14 day "Drug Holiday" starts on the first day without medicine.   Will I experience withdrawals? Unlikely with a slow taper.  What triggers withdrawals? Withdrawals are triggered by the sudden/abrupt stop of high dose opioids. Withdrawals can be avoided   by slowly decreasing the dose over a prolonged period of  time.  What are withdrawals? Symptoms associated with sudden/abrupt reduction/stopping of high-dose, long-term use of pain medication. Withdrawal are seldom seen on low dose therapy, or patients rarely taking opioid medication.  Early Withdrawal Symptoms may include: Agitation Anxiety Muscle aches Increased tearing Insomnia Runny nose Sweating Yawning  Late symptoms may include: Abdominal cramping Diarrhea Dilated pupils Goose bumps Nausea Vomiting  When could I see withdrawals? Onset: 8-24 hours after last use for most opioids. 12-48 hours for long-acting opioids (i.e.: methadone)  How long could they last? Duration: 4-10 days for most opioids. 14-21 days for long-acting opioids (i.e.: methadone)  What will happen after I complete my "Drug Holiday"? The need and indications for the opioid analgesic will be reviewed before restarting the medication. Dose requirements will likely decrease and the dose will need to be adjusted accordingly.   (Last update: 09/06/2022) ____________________________________________________________________________________________    ____________________________________________________________________________________________  WARNING: CBD (cannabidiol) & Delta (Delta-8 tetrahydrocannabinol) products.   Applicable to:  All individuals currently taking or considering taking CBD (cannabidiol) and, more important, all patients taking opioid analgesic controlled substances (pain medication). (Example: oxycodone; oxymorphone; hydrocodone; hydromorphone; morphine; methadone; tramadol; tapentadol; fentanyl; buprenorphine; butorphanol; dextromethorphan; meperidine; codeine; etc.)  Introduction:  Recently there has been a drive towards the use of "natural" products for the treatment of different conditions, including pain anxiety and sleep disorders. Marijuana and hemp are two varieties of the cannabis genus plants. Marijuana and its derivatives are  illegal, while hemp and its derivatives are not. Cannabidiol (CBD) and tetrahydrocannabinol (THC), are two natural compounds found in plants of the Cannabis genus. They can both be extracted from hemp or marijuana. Both compounds interact with your body's endocannabinoid system in very different ways. CBD is associated with pain relief (analgesia) while THC is associated with the psychoactive effects ("the high") obtained from the use of marijuana products. There are two main types of THC: Delta-9, which comes from the marijuana plant and it is illegal, and Delta-8, which comes from the hemp plant, and it is legal. (Both, Delta-9-THC and Delta-8-THC are psychoactive and give you "the high".)   Legality:  Marijuana and its derivatives: illegal Hemp and its derivatives: Legal (State dependent) UPDATE: (08/05/2021) The Drug Enforcement Agency (DEA) issued a letter stating that "delta" cannabinoids, including Delta-8-THCO and Delta-9-THCO, synthetically derived from hemp do not qualify as hemp and will be viewed as Schedule I drugs. (Schedule I drugs, substances, or chemicals are defined as drugs with no currently accepted medical use and a high potential for abuse. Some examples of Schedule I drugs are: heroin, lysergic acid diethylamide (LSD), marijuana (cannabis), 3,4-methylenedioxymethamphetamine (ecstasy), methaqualone, and peyote.) (https://www.dea.gov)  Legal status of CBD in [place]:  "Conditionally Legal"  Reference: "FDA Regulation of Cannabis and Cannabis-Derived Products, Including Cannabidiol (CBD)" - https://www.fda.gov/news-events/public-health-focus/fda-regulation-cannabis-and-cannabis-derived-products-including-cannabidiol-cbd  Warning:  CBD is not FDA approved and has not undergo the same manufacturing controls as prescription drugs.  This means that the purity and safety of available CBD may be questionable. Most of the time, despite manufacturer's claims, it is contaminated with THC  (delta-9-tetrahydrocannabinol - the chemical in marijuana responsible for the "HIGH").  When this is the case, the THC contaminant will trigger a positive urine drug screen (UDS) test for Marijuana (carboxy-THC).   The FDA recently put out a warning about 5 things that everyone should be aware of regarding Delta-8 THC: Delta-8 THC products have not been evaluated or approved by the FDA for safe use and may   be marketed in ways that put the public health at risk. The FDA has received adverse event reports involving delta-8 THC-containing products. Delta-8 THC has psychoactive and intoxicating effects. Delta-8 THC manufacturing often involve use of potentially harmful chemicals to create the concentrations of delta-8 THC claimed in the marketplace. The final delta-8 THC product may have potentially harmful by-products (contaminants) due to the chemicals used in the process. Manufacturing of delta-8 THC products may occur in uncontrolled or unsanitary settings, which may lead to the presence of unsafe contaminants or other potentially harmful substances. Delta-8 THC products should be kept out of the reach of children and pets.  NOTE: Because a positive UDS for any illicit substance is a violation of our medication agreement, your opioid analgesics (pain medicine) may be permanently discontinued.  MORE ABOUT CBD  General Information: CBD was discovered in 1940 and it is a derivative of the cannabis sativa genus plants (Marijuana and Hemp). It is one of the 113 identified substances found in Marijuana. It accounts for up to 40% of the plant's extract. As of 2018, preliminary clinical studies on CBD included research for the treatment of anxiety, movement disorders, and pain. CBD is available and consumed in multiple forms, including inhalation of smoke or vapor, as an aerosol spray, and by mouth. It may be supplied as an oil containing CBD, capsules, dried cannabis, or as a liquid solution. CBD is thought  not to be as psychoactive as THC (delta-9-tetrahydrocannabinol - the chemical in marijuana responsible for the "HIGH"). Studies suggest that CBD may interact with different biological target receptors in the body, including cannabinoid and other neurotransmitter receptors. As of 2018 the mechanism of action for its biological effects has not been determined.  Side-effects  Adverse reactions: Dry mouth, diarrhea, decreased appetite, fatigue, drowsiness, malaise, weakness, sleep disturbances, and others.  Drug interactions:  CBD may interact with medications such as blood-thinners. CBD causes drowsiness on its own and it will increase drowsiness caused by other medications, including antihistamines (such as Benadryl), benzodiazepines (Xanax, Ativan, Valium), antipsychotics, antidepressants, opioids, alcohol and supplements such as kava, melatonin and St. John's Wort.  Other drug interactions: Brivaracetam (Briviact); Caffeine; Carbamazepine (Tegretol); Citalopram (Celexa); Clobazam (Onfi); Eslicarbazepine (Aptiom); Everolimus (Zostress); Lithium; Methadone (Dolophine); Rufinamide (Banzel); Sedative medications (CNS depressants); Sirolimus (Rapamune); Stiripentol (Diacomit); Tacrolimus (Prograf); Tamoxifen ; Soltamox); Topiramate (Topamax); Valproate; Warfarin (Coumadin); Zonisamide. (Last update: 05/29/2022) ____________________________________________________________________________________________   ____________________________________________________________________________________________  Naloxone Nasal Spray  Why am I receiving this medication? [place] STOP ACT requires that all patients taking high dose opioids or at risk of opioids respiratory depression, be prescribed an opioid reversal agent, such as Naloxone (AKA: Narcan).  What is this medication? NALOXONE (nal OX one) treats opioid overdose, which causes slow or shallow breathing, severe drowsiness, or trouble staying awake. Call  emergency services after using this medication. You may need additional treatment. Naloxone works by reversing the effects of opioids. It belongs to a group of medications called opioid blockers.  COMMON BRAND NAME(S): Kloxxado, Narcan  What should I tell my care team before I take this medication? They need to know if you have any of these conditions: Heart disease Substance use disorder An unusual or allergic reaction to naloxone, other medications, foods, dyes, or preservatives Pregnant or trying to get pregnant Breast-feeding  When to use this medication? This medication is to be used for the treatment of respiratory depression (less than 8 breaths per minute) secondary to opioid overdose.   How to use this medication? This medication is   for use in the nose. Lay the person on their back. Support their neck with your hand and allow the head to tilt back before giving the medication. The nasal spray should be given into 1 nostril. After giving the medication, move the person onto their side. Do not remove or test the nasal spray until ready to use. Get emergency medical help right away after giving the first dose of this medication, even if the person wakes up. You should be familiar with how to recognize the signs and symptoms of a narcotic overdose. If more doses are needed, give the additional dose in the other nostril. Talk to your care team about the use of this medication in children. While this medication may be prescribed for children as young as newborns for selected conditions, precautions do apply.  Naloxone Overdosage: If you think you have taken too much of this medicine contact a poison control center or emergency room at once.  NOTE: This medicine is only for you. Do not share this medicine with others.  What if I miss a dose? This does not apply.  What may interact with this medication? This is only used during an emergency. No interactions are expected during emergency  use. This list may not describe all possible interactions. Give your health care provider a list of all the medicines, herbs, non-prescription drugs, or dietary supplements you use. Also tell them if you smoke, drink alcohol, or use illegal drugs. Some items may interact with your medicine.  What should I watch for while using this medication? Keep this medication ready for use in the case of an opioid overdose. Make sure that you have the phone number of your care team and local hospital ready. You may need to have additional doses of this medication. Each nasal spray contains a single dose. Some emergencies may require additional doses. After use, bring the treated person to the nearest hospital or call 911. Make sure the treating care team knows that the person has received a dose of this medication. You will receive additional instructions on what to do during and after use of this medication before an emergency occurs.  What side effects may I notice from receiving this medication? Side effects that you should report to your care team as soon as possible: Allergic reactions--skin rash, itching, hives, swelling of the face, lips, tongue, or throat Side effects that usually do not require medical attention (report these to your care team if they continue or are bothersome): Constipation Dryness or irritation inside the nose Headache Increase in blood pressure Muscle spasms Stuffy nose Toothache This list may not describe all possible side effects. Call your doctor for medical advice about side effects. You may report side effects to FDA at 1-800-FDA-1088.  Where should I keep my medication? Because this is an emergency medication, you should keep it with you at all times.  Keep out of the reach of children and pets. Store between 20 and 25 degrees C (68 and 77 degrees F). Do not freeze. Throw away any unused medication after the expiration date. Keep in original box until ready to  use.  NOTE: This sheet is a summary. It may not cover all possible information. If you have questions about this medicine, talk to your doctor, pharmacist, or health care provider.   2023 Elsevier/Gold Standard (2021-02-11 00:00:00)  ____________________________________________________________________________________________   

## 2022-09-28 NOTE — Telephone Encounter (Signed)
Requested medications are due for refill today.  yes  Requested medications are on the active medications list.  yes  Last refill. 03/21/2022 #180 1 rf  Future visit scheduled.   no  Notes to clinic.  Refill not delegated.    Requested Prescriptions  Pending Prescriptions Disp Refills   OLANZapine (ZYPREXA) 2.5 MG tablet [Pharmacy Med Name: OLANZapine 2.5 MG Oral Tablet] 180 tablet 0    Sig: Take 1 tablet by mouth twice daily     Not Delegated - Psychiatry:  Antipsychotics - Second Generation (Atypical) - olanzapine Failed - 09/28/2022 10:35 AM      Failed - This refill cannot be delegated      Failed - Last BP in normal range    BP Readings from Last 1 Encounters:  09/28/22 (!) 186/104         Failed - Lipid Panel in normal range within the last 12 months    No results found for: "CHOL", "POCCHOL", "CHOLTOT" No results found for: "LDLCALC", "LDLC", "HIRISKLDL", "POCLDL", "LDLDIRECT", "REALLDLC", "TOTLDLC" No results found for: "HDL", "POCHDL" Triglycerides  Date Value Ref Range Status  03/30/2019 228 (H) <150 mg/dL Final    Comment:    Performed at [facility], [address] [place], [place] [address]         Passed - TSH in normal range and within 360 days    TSH  Date Value Ref Range Status  01/22/2022 2.486 0.350 - 4.500 uIU/mL Final    Comment:    Performed by a 3rd Generation assay with a functional sensitivity of <=0.01 uIU/mL. Performed at [facility], [address] [place], [place] [address]          Passed - Completed PHQ-2 or PHQ-9 in the last 360 days      Passed - Last Heart Rate in normal range    Pulse Readings from Last 1 Encounters:  09/28/22 87         Passed - Valid encounter within last 6 months    Recent Outpatient Visits           2 weeks ago Visual hallucinations   [place] [facility] [place], Netta Neat, DO   2 months ago Generalized anxiety disorder with panic attacks   [place]  [facility] [place], Netta Neat, DO   2 months ago Generalized anxiety disorder with panic attacks   [place] [name] [place], Netta Neat, DO   2 months ago Generalized anxiety disorder with panic attacks   [place] [facility], Netta Neat, DO   3 months ago Symptomatic anemia    [facility], Netta Neat, DO              Passed - CBC within normal limits and completed in the last 12 months    WBC  Date Value Ref Range Status  06/19/2022 3.3 (L) 4.0 - 10.5 K/uL Final   RBC  Date Value Ref Range Status  06/19/2022 4.45 4.22 - 5.81 MIL/uL Final   Hemoglobin  Date Value Ref Range Status  06/19/2022 11.2 (L) 13.0 - 17.0 g/dL Final   HCT  Date Value Ref Range Status  06/19/2022 38.2 (L) 39.0 - 52.0 % Final   MCHC  Date Value Ref Range Status  06/19/2022 29.3 (L) 30.0 - 36.0 g/dL Final   [facility]  Date Value Ref Range Status  06/19/2022 25.2 (L) 26.0 -  34.0 pg Final   MCV  Date Value Ref Range Status  06/19/2022 85.8 80.0 - 100.0 fL Final   No results found for: "PLTCOUNTKUC", "LABPLAT", "POCPLA" RDW  Date Value Ref Range Status  06/19/2022 21.4 (H) 11.5 - 15.5 % Final         Passed - CMP within normal limits and completed in the last 12 months    Albumin  Date Value Ref Range Status  06/19/2022 4.0 3.5 - 5.0 g/dL Final  29/93/7169 4.2 3.6 - 4.6 g/dL Final    Comment:                  **Please note reference interval change**   Alkaline Phosphatase  Date Value Ref Range Status  06/19/2022 63 38 - 126 U/L Final   ALT  Date Value Ref Range Status  06/19/2022 24 0 - 44 U/L Final   AST  Date Value Ref Range Status  06/19/2022 33 15 - 41 U/L Final   BUN  Date Value Ref Range Status  06/19/2022 19 8 - 23 mg/dL Final  67/89/3810 25 8 - 27 mg/dL Final   Calcium  Date Value Ref Range Status  06/19/2022 9.1 8.9 - 10.3 mg/dL Final   Calcium,  Corrected  Date Value Ref Range Status  08/15/2016 9.3  Final   CO2  Date Value Ref Range Status  06/19/2022 24 22 - 32 mmol/L Final   Bicarbonate  Date Value Ref Range Status  09/13/2019 29.7 (H) 20.0 - 28.0 mmol/L Final   Creatinine, Ser  Date Value Ref Range Status  06/19/2022 1.17 0.61 - 1.24 mg/dL Final   Creatinine, Urine  Date Value Ref Range Status  12/13/2019 631 mg/dL Final    Comment:    RESULTS CONFIRMED BY MANUAL DILUTION Performed at [facility], [address] [place], [place] [address]    Glucose  Date Value Ref Range Status  08/15/2016 122  Final   Glucose, Bld  Date Value Ref Range Status  06/19/2022 137 (H) 70 - 99 mg/dL Final    Comment:    Glucose reference range applies only to samples taken after fasting for at least 8 hours.   Glucose-Capillary  Date Value Ref Range Status  09/13/2019 102 (H) 70 - 99 mg/dL Final    Comment:    Glucose reference range applies only to samples taken after fasting for at least 8 hours.   Potassium  Date Value Ref Range Status  06/19/2022 3.9 3.5 - 5.1 mmol/L Final   Sodium  Date Value Ref Range Status  06/19/2022 141 135 - 145 mmol/L Final  07/10/2018 140 134 - 144 mmol/L Final   Total Bilirubin  Date Value Ref Range Status  06/19/2022 0.8 0.3 - 1.2 mg/dL Final   Bilirubin Total  Date Value Ref Range Status  07/10/2018 0.2 0.0 - 1.2 mg/dL Final   Bilirubin, Total  Date Value Ref Range Status  08/15/2016 0.3  Final   Protein, ur  Date Value Ref Range Status  01/29/2022 NEGATIVE NEGATIVE mg/dL Final   Total Protein  Date Value Ref Range Status  06/19/2022 7.4 6.5 - 8.1 g/dL Final  25/85/2778 6.2 6.0 - 8.5 g/dL Final   Total Protein ELP  Date Value Ref Range Status  06/09/2022 6.4 6.0 - 8.5 g/dL Corrected   GFR calc Af Amer  Date Value Ref Range Status  02/09/2020 >60 >60 mL/min Final   GFR, Estimated  Date Value Ref Range Status  06/19/2022 >  60 >60 mL/min Final    Comment:     (NOTE) Calculated using the CKD-EPI Creatinine Equation (2021)

## 2022-09-28 NOTE — Telephone Encounter (Signed)
His care giver called and wanted to let Dr. Laban Emperor know " whenever Dr. Laban Emperor asked about his knee surgery Mr. Forinash told him that [facility] told him he would have to lose weight before they would do the surgery and he lied. They did not say that." Then she said he told her that he just didn't want to have it done. She said she couldn't tell Dr. Laban Emperor that in front of the patient because  " He is mean"

## 2022-10-03 ENCOUNTER — Ambulatory Visit: Payer: Self-pay | Admitting: *Deleted

## 2022-10-03 NOTE — Telephone Encounter (Signed)
  Chief Complaint: Arm swelling Symptoms: 1/2 dollar size swelling, left forearm. Care giver states "I think it's a blood clot." Frequency: Noted this AM Pertinent Negatives: Patient denies pain, no redness or warmth Disposition: ED /[] Urgent Care (no appt availability in office) / Appointment(In office/virtual)/  [facility]/ Home Care/ Refused Recommended Disposition /[] [facility]/  Follow-up with PCP Additional Notes: Called practice, [facility], for consult. Advised ED per caregivers report, caregiver states will follow disposition.  Reason for Disposition  MODERATE arm swelling (e.g., puffiness or swollen feeling of entire arm)    Localized area at forearm  Answer Assessment - Initial Assessment Questions 1. ONSET: "When did the swelling start?" (e.g., minutes, hours, days)    Noted this AM 2. LOCATION: "What part of the arm is swollen?"  "Are both arms swollen or just one arm?"     Left forearm 3. SEVERITY: "How bad is the swelling?" (e.g., localized; mild, moderate, severe)   - LOCALIZED: Small area of puffiness or swelling on just one arm   - JOINT SWELLING: Swelling of one joint   - MILD: Puffiness or swelling of hand   - MODERATE: Puffiness or swollen feeling of entire arm    - SEVERE: All of arm looks swollen; pitting edema     1/2 dollar size, hard, warmth, no redness 4. REDNESS: "Does the swelling look red or infected?"     No 5. PAIN: "Is the swelling painful to touch?" If Yes, ask: "How painful is it?"   (Scale 1-10; mild, moderate or severe)     No 6. FEVER: "Do you have a fever?" If Yes, ask: "What is it, how was it measured, and when did it start?"      No 7. CAUSE: "What do you think is causing the arm swelling?"     "Blood clot" 8. MEDICAL HISTORY: "Do you have a history of heart failure, kidney disease, liver failure, or cancer?"      9. RECURRENT SYMPTOM: "Have you had arm swelling before?" If Yes, ask: "When was the last time?"  "What happened that time?"     No 10. OTHER SYMPTOMS: "Do you have any other symptoms?" (e.g., chest pain, difficulty breathing)       No  Protocols used: Arm Swelling and Edema-A-AH

## 2022-10-04 ENCOUNTER — Other Ambulatory Visit: Payer: Self-pay | Admitting: Pain Medicine

## 2022-10-04 LAB — TOXASSURE SELECT 13 (MW), URINE

## 2022-10-10 ENCOUNTER — Inpatient Hospital Stay
Admission: EM | Admit: 2022-10-10 | Discharge: 2022-10-16 | DRG: 178 | Disposition: A | Discharge status: Home Health | Payer: Medicare Other | Attending: Internal Medicine | Admitting: Internal Medicine

## 2022-10-10 ENCOUNTER — Emergency Department: Payer: Medicare Other

## 2022-10-10 ENCOUNTER — Other Ambulatory Visit: Payer: Self-pay

## 2022-10-10 DIAGNOSIS — M549 Dorsalgia, unspecified: Secondary | ICD-10-CM | POA: Diagnosis not present

## 2022-10-10 DIAGNOSIS — J441 Chronic obstructive pulmonary disease with (acute) exacerbation: Secondary | ICD-10-CM | POA: Diagnosis present

## 2022-10-10 DIAGNOSIS — Z66 Do not resuscitate: Secondary | ICD-10-CM | POA: Diagnosis not present

## 2022-10-10 DIAGNOSIS — I7143 Infrarenal abdominal aortic aneurysm, without rupture: Secondary | ICD-10-CM | POA: Diagnosis present

## 2022-10-10 DIAGNOSIS — U071 COVID-19: Principal | ICD-10-CM | POA: Diagnosis present

## 2022-10-10 DIAGNOSIS — Z881 Allergy status to other antibiotic agents status: Secondary | ICD-10-CM

## 2022-10-10 DIAGNOSIS — J449 Chronic obstructive pulmonary disease, unspecified: Secondary | ICD-10-CM | POA: Diagnosis present

## 2022-10-10 DIAGNOSIS — G8929 Other chronic pain: Secondary | ICD-10-CM

## 2022-10-10 DIAGNOSIS — D696 Thrombocytopenia, unspecified: Secondary | ICD-10-CM | POA: Insufficient documentation

## 2022-10-10 DIAGNOSIS — Z79899 Other long term (current) drug therapy: Secondary | ICD-10-CM

## 2022-10-10 DIAGNOSIS — R531 Weakness: Secondary | ICD-10-CM

## 2022-10-10 DIAGNOSIS — K219 Gastro-esophageal reflux disease without esophagitis: Secondary | ICD-10-CM | POA: Diagnosis present

## 2022-10-10 DIAGNOSIS — F332 Major depressive disorder, recurrent severe without psychotic features: Secondary | ICD-10-CM | POA: Diagnosis present

## 2022-10-10 DIAGNOSIS — F419 Anxiety disorder, unspecified: Secondary | ICD-10-CM | POA: Diagnosis present

## 2022-10-10 DIAGNOSIS — Z8616 Personal history of COVID-19: Secondary | ICD-10-CM

## 2022-10-10 DIAGNOSIS — Z8249 Family history of ischemic heart disease and other diseases of the circulatory system: Secondary | ICD-10-CM

## 2022-10-10 DIAGNOSIS — M810 Age-related osteoporosis without current pathological fracture: Secondary | ICD-10-CM | POA: Diagnosis present

## 2022-10-10 DIAGNOSIS — Y92009 Unspecified place in unspecified non-institutional (private) residence as the place of occurrence of the external cause: Secondary | ICD-10-CM

## 2022-10-10 DIAGNOSIS — Z888 Allergy status to other drugs, medicaments and biological substances status: Secondary | ICD-10-CM

## 2022-10-10 DIAGNOSIS — H548 Legal blindness, as defined in USA: Secondary | ICD-10-CM | POA: Diagnosis present

## 2022-10-10 DIAGNOSIS — N289 Disorder of kidney and ureter, unspecified: Secondary | ICD-10-CM | POA: Diagnosis present

## 2022-10-10 DIAGNOSIS — H409 Unspecified glaucoma: Secondary | ICD-10-CM | POA: Diagnosis present

## 2022-10-10 DIAGNOSIS — Z791 Long term (current) use of non-steroidal anti-inflammatories (NSAID): Secondary | ICD-10-CM

## 2022-10-10 DIAGNOSIS — Z818 Family history of other mental and behavioral disorders: Secondary | ICD-10-CM

## 2022-10-10 DIAGNOSIS — W19XXXA Unspecified fall, initial encounter: Principal | ICD-10-CM

## 2022-10-10 DIAGNOSIS — H9190 Unspecified hearing loss, unspecified ear: Secondary | ICD-10-CM | POA: Diagnosis present

## 2022-10-10 DIAGNOSIS — F17220 Nicotine dependence, chewing tobacco, uncomplicated: Secondary | ICD-10-CM | POA: Diagnosis present

## 2022-10-10 DIAGNOSIS — I10 Essential (primary) hypertension: Secondary | ICD-10-CM | POA: Diagnosis present

## 2022-10-10 LAB — URINALYSIS, ROUTINE W REFLEX MICROSCOPIC
Bacteria, UA: NONE SEEN
Bilirubin Urine: NEGATIVE
Glucose, UA: NEGATIVE mg/dL
Hgb urine dipstick: NEGATIVE
Ketones, ur: NEGATIVE mg/dL
Leukocytes,Ua: NEGATIVE
Nitrite: NEGATIVE
Protein, ur: 30 mg/dL — AB
Specific Gravity, Urine: 1.039 — ABNORMAL HIGH (ref 1.005–1.030)
pH: 6 (ref 5.0–8.0)

## 2022-10-10 LAB — COMPREHENSIVE METABOLIC PANEL
ALT: 12 U/L (ref 0–44)
AST: 25 U/L (ref 15–41)
Albumin: 3.7 g/dL (ref 3.5–5.0)
Alkaline Phosphatase: 57 U/L (ref 38–126)
Anion gap: 7 (ref 5–15)
BUN: 26 mg/dL — ABNORMAL HIGH (ref 8–23)
CO2: 26 mmol/L (ref 22–32)
Calcium: 8.9 mg/dL (ref 8.9–10.3)
Chloride: 108 mmol/L (ref 98–111)
Creatinine, Ser: 1.22 mg/dL (ref 0.61–1.24)
GFR, Estimated: 58 mL/min — ABNORMAL LOW (ref >60)
Glucose, Bld: 136 mg/dL — ABNORMAL HIGH (ref 70–99)
Potassium: 4 mmol/L (ref 3.5–5.1)
Sodium: 141 mmol/L (ref 135–145)
Total Bilirubin: 0.7 mg/dL (ref 0.3–1.2)
Total Protein: 6.6 g/dL (ref 6.5–8.1)

## 2022-10-10 LAB — CBC WITH DIFFERENTIAL/PLATELET
Abs Immature Granulocytes: 0.31 10*3/uL — ABNORMAL HIGH (ref 0.00–0.07)
Basophils Absolute: 0 10*3/uL (ref 0.0–0.1)
Basophils Relative: 0 %
Eosinophils Absolute: 0.2 10*3/uL (ref 0.0–0.5)
Eosinophils Relative: 4 %
HCT: 41.2 % (ref 39.0–52.0)
Hemoglobin: 13 g/dL (ref 13.0–17.0)
Immature Granulocytes: 6 %
Lymphocytes Relative: 13 %
Lymphs Abs: 0.7 10*3/uL (ref 0.7–4.0)
MCH: 29.2 pg (ref 26.0–34.0)
MCHC: 31.6 g/dL (ref 30.0–36.0)
MCV: 92.6 fL (ref 80.0–100.0)
Monocytes Absolute: 1.4 10*3/uL — ABNORMAL HIGH (ref 0.1–1.0)
Monocytes Relative: 28 %
Neutro Abs: 2.4 10*3/uL (ref 1.7–7.7)
Neutrophils Relative %: 49 %
Platelets: 102 10*3/uL — ABNORMAL LOW (ref 150–400)
RBC: 4.45 MIL/uL (ref 4.22–5.81)
RDW: 15.1 % (ref 11.5–15.5)
Smear Review: NORMAL
WBC: 5 10*3/uL (ref 4.0–10.5)
nRBC: 0 % (ref 0.0–0.2)

## 2022-10-10 LAB — LIPASE, BLOOD: Lipase: 20 U/L (ref 11–51)

## 2022-10-10 LAB — CK: Total CK: 45 U/L — ABNORMAL LOW (ref 49–397)

## 2022-10-10 MED ORDER — FINASTERIDE 5 MG PO TABS: 5.0000 mg | ORAL_TABLET | Freq: Every day | ORAL | Status: DC

## 2022-10-10 MED ORDER — CARVEDILOL 6.25 MG PO TABS: 12.5000 mg | ORAL_TABLET | Freq: Two times a day (BID) | ORAL | Status: DC

## 2022-10-10 MED ORDER — OLANZAPINE 5 MG PO TABS: 2.5000 mg | ORAL_TABLET | Freq: Two times a day (BID) | ORAL | Status: DC

## 2022-10-10 MED ORDER — DULOXETINE HCL 60 MG PO CPEP: 90.0000 mg | ORAL_CAPSULE | Freq: Every day | ORAL | Status: DC

## 2022-10-10 MED ORDER — MORPHINE SULFATE (PF) 4 MG/ML IV SOLN
4.0000 mg | Freq: Once | INTRAVENOUS | Status: AC
  Administered 2022-10-10: 4 mg via INTRAVENOUS
  Filled 2022-10-10: qty 1

## 2022-10-10 MED ORDER — BUPRENORPHINE 10 MCG/HR TD PTWK: 1.0000 | MEDICATED_PATCH | TRANSDERMAL | Status: DC

## 2022-10-10 MED ORDER — IOHEXOL 350 MG/ML SOLN
80.0000 mL | Freq: Once | INTRAVENOUS | Status: AC | PRN
  Administered 2022-10-10: 80 mL via INTRAVENOUS

## 2022-10-10 MED ORDER — PANTOPRAZOLE SODIUM 40 MG PO TBEC: 40.0000 mg | DELAYED_RELEASE_TABLET | Freq: Every day | ORAL | Status: DC

## 2022-10-10 NOTE — ED Provider Notes (Signed)
[facility] Provider Note  Patient Contact: 6:53 PM (approximate)   History   Fall   HPI  Aaron Gibbs is a 88 y.o. male with a history of anxiety, COPD and osteoporosis, presents to the emergency department after patient had a mechanical fall and hit his upper back against a bed frame.  Patient states that he has been in  pain since that time and will be unable to take care of himself at home. He is requesting placement with social work.  He denies hitting his head or his neck.  He denies chest pain, chest tightness or shortness of breath.  No vomiting or abdominal pain.  Patient states that he has not been able to ambulate easily since fall occurred. No nausea or fever at home.       Physical Exam   Triage Vital Signs: ED Triage Vitals  Enc Vitals Group     BP 10/10/22 1634 (!) 185/101     Pulse Rate 10/10/22 1634 85     Resp 10/10/22 1634 18     Temp 10/10/22 1634 98 F (36.7 C)     Temp src --      SpO2 10/10/22 1634 93 %     Weight --      Height --      Head Circumference --      Peak Flow --      Pain Score 10/10/22 1627 7     Pain Loc --      Pain Edu? --      Excl. in GC? --     Most recent vital signs: Vitals:   10/10/22 2200 10/10/22 2230  BP:  (!) 185/99  Pulse: (!) 103 100  Resp:  20  Temp:  98.1 F (36.7 C)  SpO2: 95% 93%     General: Alert and in no acute distress. Eyes:  PERRL. EOMI. Head: No acute traumatic findings ENT:      Nose: No congestion/rhinnorhea.      Mouth/Throat: Mucous membranes are moist. Neck: No stridor. No cervical spine tenderness to palpation. Cardiovascular:  Good peripheral perfusion Respiratory: Normal respiratory effort without tachypnea or retractions. Lungs CTAB. Good air entry to the bases with no decreased or absent breath sounds. Gastrointestinal: Bowel sounds 4 quadrants. Soft and nontender to palpation. No guarding or rigidity. No palpable masses. No distention. No CVA  tenderness. Musculoskeletal: Full range of motion to all extremities. Patient has tenderness to palpation along thoracic spine.  Neurologic:  No gross focal neurologic deficits are appreciated.  Skin:   No rash noted Other:   ED Results / Procedures / Treatments   Labs (all labs ordered are listed, but only abnormal results are displayed) Labs Reviewed  CBC WITH DIFFERENTIAL/PLATELET - Abnormal; Notable for the following components:      Result Value   Platelets 102 (*)    Monocytes Absolute 1.4 (*)    Abs Immature Granulocytes 0.31 (*)    All other components within normal limits  COMPREHENSIVE METABOLIC PANEL - Abnormal; Notable for the following components:   Glucose, Bld 136 (*)    BUN 26 (*)    GFR, Estimated 58 (*)    All other components within normal limits  URINALYSIS, ROUTINE W REFLEX MICROSCOPIC - Abnormal; Notable for the following components:   Color, Urine YELLOW (*)    APPearance CLEAR (*)    Specific Gravity, Urine 1.039 (*)    Protein, ur 30 (*)    All  other components within normal limits  CK - Abnormal; Notable for the following components:   Total CK 45 (*)    All other components within normal limits  LIPASE, BLOOD       RADIOLOGY  I personally viewed and evaluated these images as part of my medical decision making, as well as reviewing the written report by the radiologist.  ED Provider Interpretation:   CTs of the head and cervical spine showed no evidence of intracranial bleed, skull fracture fractures of the cervical spine.  CT chest abdomen and pelvis:  No acute abnormality in the chest, abdomen pelvis.  Patient does have a 14 mm indeterminate lesion within the posterior interpolar region of the right kidney and slight increase in interval size of a 5.0 cm infrarenal abdominal aortic aneurysm.     PROCEDURES:  Critical Care performed: No  Procedures   MEDICATIONS ORDERED IN ED: Medications  morphine (PF) 4 MG/ML injection 4 mg (4  mg Intravenous Given 10/10/22 1919)  iohexol (OMNIPAQUE) 350 MG/ML injection 80 mL (80 mLs Intravenous Contrast Given 10/10/22 1952)     IMPRESSION / MDM / ASSESSMENT AND PLAN / ED COURSE  I reviewed the triage vital signs and the nursing notes.                              Assessment and plan:   Fall 88 year old male presents to the emergency department after he fell against a bed frame earlier today.  Patient was hypertensive at triage but vital signs otherwise reassuring.  On exam, patient was alert but seemed uncomfortable with palpation along the paraspinal muscles of the thoracic spine.  CBC, CMP and lipase reassuring.  CK within range.  Urinalysis shows no signs of UTI.  CTs of the head, cervical spine, chest, abdomen and pelvis show no acute abnormality.  Patient does have a 14 mm indeterminate lesion within the posterior interpolar region of the right kidney and slight increase in interval size of a 5.0 cm infrarenal abdominal aortic aneurysm.  Patient requested admission multiple times in the emergency department stating that he does not feel that he can take care of himself at home.  I explained to patient that he does not have a medical reason for admission at this time and he requested to stay overnight in the emergency department to talk with a social worker in the morning regarding placement. Patient will board for social work/pt consult.  I discussed patient case with attending Dr. Larinda Buttery who agrees with management plan at this time.   FINAL CLINICAL IMPRESSION(S) / ED DIAGNOSES   Final diagnoses:  Fall, initial encounter     Rx / DC Orders   ED Discharge Orders     None        Note:  This document was prepared using Dragon voice recognition software and may include unintentional dictation errors.   Pia Mau [place], PA-C 10/10/22 2256    Chesley Noon, MD 10/10/22 [phone]

## 2022-10-10 NOTE — ED Notes (Signed)
Patient placed on 2L [place].

## 2022-10-10 NOTE — ED Triage Notes (Signed)
Pt comes via EMS from home with c/o trip and fall. Pt is blind. Pt fell face forward on left side. Pt state left flank pain. Pt states sob with cough for two weeks now. Pt denies any loc or hitting head. No thinners.

## 2022-10-10 NOTE — ED Notes (Signed)
Billey Gosling (caregiver)- [phone]

## 2022-10-11 ENCOUNTER — Encounter: Payer: Self-pay | Admitting: Family Medicine

## 2022-10-11 DIAGNOSIS — Y92009 Unspecified place in unspecified non-institutional (private) residence as the place of occurrence of the external cause: Secondary | ICD-10-CM

## 2022-10-11 DIAGNOSIS — J441 Chronic obstructive pulmonary disease with (acute) exacerbation: Secondary | ICD-10-CM

## 2022-10-11 DIAGNOSIS — W19XXXA Unspecified fall, initial encounter: Secondary | ICD-10-CM

## 2022-10-11 DIAGNOSIS — U071 COVID-19: Secondary | ICD-10-CM | POA: Diagnosis not present

## 2022-10-11 DIAGNOSIS — D696 Thrombocytopenia, unspecified: Secondary | ICD-10-CM | POA: Insufficient documentation

## 2022-10-11 DIAGNOSIS — F32A Depression, unspecified: Secondary | ICD-10-CM

## 2022-10-11 DIAGNOSIS — I10 Essential (primary) hypertension: Secondary | ICD-10-CM

## 2022-10-11 DIAGNOSIS — R531 Weakness: Secondary | ICD-10-CM

## 2022-10-11 LAB — CBC WITH DIFFERENTIAL/PLATELET
Abs Immature Granulocytes: 0.29 10*3/uL — ABNORMAL HIGH (ref 0.00–0.07)
Abs Immature Granulocytes: 0.16 10*3/uL — ABNORMAL HIGH (ref 0.00–0.07)
Basophils Absolute: 0 10*3/uL (ref 0.0–0.1)
Basophils Absolute: 0 10*3/uL (ref 0.0–0.1)
Basophils Relative: 0 %
Basophils Relative: 0 %
Eosinophils Absolute: 0.1 10*3/uL (ref 0.0–0.5)
Eosinophils Absolute: 0 10*3/uL (ref 0.0–0.5)
Eosinophils Relative: 3 %
Eosinophils Relative: 0 %
HCT: 37.4 % — ABNORMAL LOW (ref 39.0–52.0)
HCT: 39.7 % (ref 39.0–52.0)
Hemoglobin: 11.9 g/dL — ABNORMAL LOW (ref 13.0–17.0)
Hemoglobin: 12.5 g/dL — ABNORMAL LOW (ref 13.0–17.0)
Immature Granulocytes: 8 %
Immature Granulocytes: 4 %
Lymphocytes Relative: 18 %
Lymphocytes Relative: 20 %
Lymphs Abs: 0.7 10*3/uL (ref 0.7–4.0)
Lymphs Abs: 0.8 10*3/uL (ref 0.7–4.0)
MCH: 29.5 pg (ref 26.0–34.0)
MCH: 28.7 pg (ref 26.0–34.0)
MCHC: 31.8 g/dL (ref 30.0–36.0)
MCHC: 31.5 g/dL (ref 30.0–36.0)
MCV: 92.8 fL (ref 80.0–100.0)
MCV: 91.1 fL (ref 80.0–100.0)
Monocytes Absolute: 1.1 10*3/uL — ABNORMAL HIGH (ref 0.1–1.0)
Monocytes Absolute: 0.6 10*3/uL (ref 0.1–1.0)
Monocytes Relative: 29 %
Monocytes Relative: 16 %
Neutro Abs: 1.5 10*3/uL — ABNORMAL LOW (ref 1.7–7.7)
Neutro Abs: 2.3 10*3/uL (ref 1.7–7.7)
Neutrophils Relative %: 42 %
Neutrophils Relative %: 60 %
Platelets: 95 10*3/uL — ABNORMAL LOW (ref 150–400)
Platelets: 110 10*3/uL — ABNORMAL LOW (ref 150–400)
RBC: 4.03 MIL/uL — ABNORMAL LOW (ref 4.22–5.81)
RBC: 4.36 MIL/uL (ref 4.22–5.81)
RDW: 15.4 % (ref 11.5–15.5)
RDW: 15.3 % (ref 11.5–15.5)
Smear Review: NORMAL
Smear Review: NORMAL
WBC: 3.7 10*3/uL — ABNORMAL LOW (ref 4.0–10.5)
WBC: 3.8 10*3/uL — ABNORMAL LOW (ref 4.0–10.5)
nRBC: 0 % (ref 0.0–0.2)
nRBC: 0 % (ref 0.0–0.2)

## 2022-10-11 LAB — TECHNOLOGIST SMEAR REVIEW
Plt Morphology: NORMAL
Plt Morphology: NORMAL

## 2022-10-11 LAB — SARS CORONAVIRUS 2 BY RT PCR: SARS Coronavirus 2 by RT PCR: POSITIVE — AB

## 2022-10-11 MED ORDER — ACETAMINOPHEN 500 MG PO TABS
1000.0000 mg | ORAL_TABLET | Freq: Three times a day (TID) | ORAL | Status: DC | PRN
  Administered 2022-10-11 – 2022-10-16 (×6): 1000 mg via ORAL
  Filled 2022-10-11 (×6): qty 2

## 2022-10-11 MED ORDER — ONDANSETRON HCL 4 MG/2ML IJ SOLN: 4.0000 mg | Freq: Four times a day (QID) | INTRAMUSCULAR | Status: DC | PRN

## 2022-10-11 MED ORDER — TRAZODONE HCL 50 MG PO TABS
150.0000 mg | ORAL_TABLET | Freq: Every evening | ORAL | Status: DC | PRN
  Administered 2022-10-16: 150 mg via ORAL
  Filled 2022-10-11: qty 3

## 2022-10-11 MED ORDER — TAMSULOSIN HCL 0.4 MG PO CAPS
0.4000 mg | ORAL_CAPSULE | Freq: Every day | ORAL | Status: DC
  Administered 2022-10-11 – 2022-10-16 (×6): 0.4 mg via ORAL
  Filled 2022-10-11 (×6): qty 1

## 2022-10-11 MED ORDER — ONDANSETRON HCL 4 MG PO TABS: 4.0000 mg | ORAL_TABLET | Freq: Four times a day (QID) | ORAL | Status: DC | PRN

## 2022-10-11 MED ORDER — MELOXICAM 7.5 MG PO TABS
15.0000 mg | ORAL_TABLET | Freq: Every day | ORAL | Status: DC
  Administered 2022-10-11 – 2022-10-16 (×6): 15 mg via ORAL
  Filled 2022-10-11 (×6): qty 2

## 2022-10-11 MED ORDER — PANTOPRAZOLE SODIUM 40 MG PO TBEC
40.0000 mg | DELAYED_RELEASE_TABLET | Freq: Every day | ORAL | Status: DC
  Administered 2022-10-11 – 2022-10-16 (×6): 40 mg via ORAL
  Filled 2022-10-11 (×6): qty 1

## 2022-10-11 MED ORDER — GABAPENTIN 300 MG PO CAPS
300.0000 mg | ORAL_CAPSULE | Freq: Every day | ORAL | Status: DC
  Administered 2022-10-11 – 2022-10-16 (×6): 300 mg via ORAL
  Filled 2022-10-11 (×6): qty 1

## 2022-10-11 MED ORDER — ENOXAPARIN SODIUM 40 MG/0.4ML IJ SOSY
40.0000 mg | PREFILLED_SYRINGE | INTRAMUSCULAR | Status: DC
  Administered 2022-10-11 – 2022-10-15 (×5): 40 mg via SUBCUTANEOUS
  Filled 2022-10-11 (×5): qty 0.4

## 2022-10-11 MED ORDER — BUPRENORPHINE 10 MCG/HR TD PTWK
1.0000 | MEDICATED_PATCH | TRANSDERMAL | Status: DC
  Administered 2022-10-11: 1 via TRANSDERMAL
  Filled 2022-10-11: qty 1

## 2022-10-11 MED ORDER — DEXAMETHASONE SODIUM PHOSPHATE 10 MG/ML IJ SOLN
6.0000 mg | INTRAMUSCULAR | Status: DC
  Administered 2022-10-11 – 2022-10-13 (×3): 6 mg via INTRAVENOUS
  Filled 2022-10-11 (×3): qty 1

## 2022-10-11 MED ORDER — SODIUM CHLORIDE 0.9 % IV SOLN: INTRAVENOUS | Status: DC

## 2022-10-11 MED ORDER — FERROUS SULFATE 325 (65 FE) MG PO TABS
325.0000 mg | ORAL_TABLET | Freq: Every day | ORAL | Status: DC
  Administered 2022-10-11 – 2022-10-15 (×5): 325 mg via ORAL
  Filled 2022-10-11 (×5): qty 1

## 2022-10-11 MED ORDER — ACETAMINOPHEN 500 MG PO TABS
1000.0000 mg | ORAL_TABLET | Freq: Once | ORAL | Status: AC
  Administered 2022-10-11: 1000 mg via ORAL
  Filled 2022-10-11: qty 2

## 2022-10-11 MED ORDER — MELATONIN 5 MG PO TABS
5.0000 mg | ORAL_TABLET | Freq: Every day | ORAL | Status: DC
  Administered 2022-10-11 – 2022-10-15 (×5): 5 mg via ORAL
  Filled 2022-10-11 (×5): qty 1

## 2022-10-11 MED ORDER — IPRATROPIUM-ALBUTEROL 0.5-2.5 (3) MG/3ML IN SOLN: 3.0000 mL | RESPIRATORY_TRACT | Status: DC | PRN

## 2022-10-11 MED ORDER — BUMETANIDE 0.5 MG PO TABS
0.5000 mg | ORAL_TABLET | Freq: Two times a day (BID) | ORAL | Status: DC
  Administered 2022-10-11 – 2022-10-16 (×11): 0.5 mg via ORAL
  Filled 2022-10-11 (×13): qty 1

## 2022-10-11 MED ORDER — LORAZEPAM 0.5 MG PO TABS
0.5000 mg | ORAL_TABLET | Freq: Three times a day (TID) | ORAL | Status: DC
  Administered 2022-10-11 – 2022-10-16 (×17): 0.5 mg via ORAL
  Filled 2022-10-11 (×17): qty 1

## 2022-10-11 MED ORDER — SODIUM CHLORIDE 0.9 % IV SOLN
100.0000 mg | Freq: Two times a day (BID) | INTRAVENOUS | Status: DC
  Administered 2022-10-11 – 2022-10-13 (×6): 100 mg via INTRAVENOUS
  Filled 2022-10-11 (×7): qty 100

## 2022-10-11 MED ORDER — GUAIFENESIN-DM 100-10 MG/5ML PO SYRP
5.0000 mL | ORAL_SOLUTION | ORAL | Status: DC | PRN
  Administered 2022-10-11 – 2022-10-13 (×6): 5 mL via ORAL
  Filled 2022-10-11 (×6): qty 10

## 2022-10-11 NOTE — Assessment & Plan Note (Signed)
PPI ?

## 2022-10-11 NOTE — Assessment & Plan Note (Signed)
Acute decompensation of baseline weakness setting of COVID-19 COPD exacerbation Noted secondary to fall PT OT evaluation Treat COVID and COPD Fall precautions Follow

## 2022-10-11 NOTE — Assessment & Plan Note (Signed)
BP stable Titrate home regimen 

## 2022-10-11 NOTE — ED Notes (Signed)
Pt ambulated about 250 ft with walker. Pt o2 sat stable at 93% but pt had to pause to catch breath two times

## 2022-10-11 NOTE — Evaluation (Signed)
Physical Therapy Evaluation Patient Details Name: Aaron Gibbs MRN: [id number] DOB: 12-Nov-1934 Today's Date: 10/11/2022  History of Present Illness  Trae Bovenzi is an 88 y.o. male with medical history significant for COPD, hypertension, hyperlipidemia, anxiety/depression with history of suicidal ideation, and chronic pain syndrome who presents to the ED for SOB and reported inability to care for himself.   Clinical Impression  Patient alert, agreeable to PT evaluation with some encouragement, reported low back pain, 7/10. Per pt at baseline he has a 24/7 caregiver that assists with home navigation due to blindness, assists with ADLs, and performs homemaking duties.   He was able to perform supine <> sit with bed rails and supervision, fair sitting balance noted. minA to don slippers (more assist for visual impairments than physical impairments). Sit <> stand initially pt with some effort and stated "I can't do this" several times, verbally cued for hand placement. Very light minA to come up into standing. He ambulated ~18ft with RW and assistance to navigate environment; pt stated he was unable to go any further due to SOB and fatigue.  Overall the patient demonstrated deficits (see "PT Problem List") that impede the patient's functional abilities, safety, and mobility and would benefit from skilled PT intervention. Recommendation at this time is to continue skilled PT intervention to maximize function and safety.   spO2 at rest 95% on room air, 98% after minimal walking. HR in 130s and pt with elevated RR throughout PT session.        Recommendations for follow up therapy are one component of a multi-disciplinary discharge planning process, led by the attending physician.  Recommendations may be updated based on patient status, additional functional criteria and insurance authorization.  Follow Up Recommendations       Assistance Recommended at Discharge Frequent or constant  Supervision/Assistance  Patient can return home with the following  Assistance with cooking/housework;Direct supervision/assist for medications management;Assist for transportation;Help with stairs or ramp for entrance;Assistance with feeding;A little help with bathing/dressing/bathroom;A little help with walking and/or transfers    Equipment Recommendations None recommended by PT  Recommendations for Other Services       Functional Status Assessment Patient has had a recent decline in their functional status and demonstrates the ability to make significant improvements in function in a reasonable and predictable amount of time.     Precautions / Restrictions Precautions Precautions: Fall Restrictions Weight Bearing Restrictions: No      Mobility  Bed Mobility Overal bed mobility: Needs Assistance Bed Mobility: Supine to Sit, Sit to Supine     Supine to sit: Supervision Sit to supine: Supervision        Transfers Overall transfer level: Needs assistance Equipment used: Rolling walker (2 wheels) Transfers: Sit to/from Stand Sit to Stand: Min guard, Min assist           General transfer comment: very light minA from EOB, pt reported "I cant do it" educated on hand placement    Ambulation/Gait Ambulation/Gait assistance: Min guard Gait Distance (Feet): 10 Feet Assistive device: Rolling walker (2 wheels)         General Gait Details: assistance with RW guidance, no physical assistance to ambulate. pt reported fatigue and SOB and unable to ambulate further  Stairs            Wheelchair Mobility    Modified Rankin (Stroke Patients Only)       Balance Overall balance assessment: Needs assistance Sitting-balance support: Feet supported Sitting balance-Leahy Scale: Good Sitting balance -  Comments: able to don shoes with minA   Standing balance support: Reliant on assistive device for balance Standing balance-Leahy Scale: Fair                                Pertinent Vitals/Pain Pain Assessment Pain Assessment: 0-10 Pain Score: 7  Pain Location: R sided back pain Pain Descriptors / Indicators: Aching, Sore Pain Intervention(s): Limited activity within patient's tolerance, Monitored during session, Repositioned    Home Living Family/patient expects to be discharged to:: Private residence Living Arrangements: Other (Comment) (caregiver) Available Help at Discharge: Personal care attendant;Available 24 hours/day Type of Home: House Home Access: Stairs to enter Entrance Stairs-Rails: Right;Left;Can reach both Entrance Stairs-Number of Steps: 2   Home Layout: One level Home Equipment: Agricultural consultant (2 wheels);Transport chair;Shower seat      Prior Function               Mobility Comments: reported ambulatory with RW caregiver assists with navigation due to visual deficits ADLs Comments: caregiver performs homemaking duties, assists with dressing and bathing     Hand Dominance        Extremity/Trunk Assessment   Upper Extremity Assessment Upper Extremity Assessment: Generalized weakness    Lower Extremity Assessment Lower Extremity Assessment: Generalized weakness    Cervical / Trunk Assessment Cervical / Trunk Assessment: Kyphotic  Communication      Cognition Arousal/Alertness: Awake/alert Behavior During Therapy: Anxious, WFL for tasks assessed/performed Overall Cognitive Status: Within Functional Limits for tasks assessed                                          General Comments      Exercises     Assessment/Plan    PT Assessment Patient needs continued PT services  PT Problem List Decreased mobility;Decreased activity tolerance;Decreased balance       PT Treatment Interventions Therapeutic activities;DME instruction;Gait training;Therapeutic exercise;Patient/family education;Balance training;Stair training;Functional mobility training;Neuromuscular re-education     PT Goals (Current goals can be found in the Care Plan section)  Acute Rehab PT Goals Patient Stated Goal: to feel better PT Goal Formulation: With patient Time For Goal Achievement: 10/25/22 Potential to Achieve Goals: Good    Frequency Min 3X/week     Co-evaluation               AM-PAC PT "6 Clicks" Mobility  Outcome Measure Help needed turning from your back to your side while in a flat bed without using bedrails?: None Help needed moving from lying on your back to sitting on the side of a flat bed without using bedrails?: None Help needed moving to and from a bed to a chair (including a wheelchair)?: None Help needed standing up from a chair using your arms (e.g., wheelchair or bedside chair)?: A Little Help needed to walk in hospital room?: A Little Help needed climbing 3-5 steps with a railing? : A Little 6 Click Score: 21    End of Session   Activity Tolerance: Other (comment) (limited by pt reported SOB) Patient left: in bed;with call bell/phone within reach;with bed alarm set Nurse Communication: Mobility status PT Visit Diagnosis: Other abnormalities of gait and mobility (R26.89);Muscle weakness (generalized) (M62.81)    Time: 9604-5409 PT Time Calculation (min) (ACUTE ONLY): 12 min   Charges:   PT Evaluation $PT Eval Low Complexity:  1 Low PT Treatments $Therapeutic Activity: 8-22 mins        Olga Coaster PT, DPT 1:32 PM,10/11/22

## 2022-10-11 NOTE — Assessment & Plan Note (Signed)
Mechanical fall at home in the setting of generalized weakness associated with COVID-19 Imaging including CT of the head, C, T and L-spine grossly stable PT OT evaluation Fall precautions

## 2022-10-11 NOTE — Evaluation (Signed)
Occupational Therapy Evaluation Patient Details Name: Aaron Gibbs MRN: [id number] DOB: 04/11/1935 Today's Date: 10/11/2022   History of Present Illness Aaron Gibbs is an 88 y.o. male with medical history significant for COPD, hypertension, hyperlipidemia, anxiety/depression with history of suicidal ideation, and chronic pain syndrome who presents to the ED for SOB and reported inability to care for himself.   Clinical Impression   Patient presenting with decreased Ind in self care,balance, functional mobility, transfers, endurance, and safety awareness. Patient reports living at home with 24/7 caregiver secondary to visual impairments but being mod I overall. Patient currently functioning at min guard- min A for functional mobility and self care tasks. Pt is not feeling well during session and needing encouragement to participate. Patient will benefit from acute OT to increase overall independence in the areas of ADLs, functional mobility, and safety awareness in order to safely discharge.     Recommendations for follow up therapy are one component of a multi-disciplinary discharge planning process, led by the attending physician.  Recommendations may be updated based on patient status, additional functional criteria and insurance authorization.   Assistance Recommended at Discharge Frequent or constant Supervision/Assistance  Patient can return home with the following A little help with walking and/or transfers;A little help with bathing/dressing/bathroom;Assistance with cooking/housework;Assist for transportation;Help with stairs or ramp for entrance    Functional Status Assessment  Patient has had a recent decline in their functional status and demonstrates the ability to make significant improvements in function in a reasonable and predictable amount of time.  Equipment Recommendations  None recommended by OT       Precautions / Restrictions Precautions Precautions:  Fall Restrictions Weight Bearing Restrictions: No      Mobility Bed Mobility Overal bed mobility: Needs Assistance Bed Mobility: Supine to Sit, Sit to Supine     Supine to sit: Supervision Sit to supine: Supervision        Transfers Overall transfer level: Needs assistance Equipment used: Rolling walker (2 wheels) Transfers: Sit to/from Stand Sit to Stand: Min guard, Min assist                  Balance Overall balance assessment: Needs assistance Sitting-balance support: Feet supported Sitting balance-Leahy Scale: Good Sitting balance - Comments: able to don shoes with minA   Standing balance support: Reliant on assistive device for balance Standing balance-Leahy Scale: Fair                             ADL either performed or assessed with clinical judgement   ADL Overall ADL's : Needs assistance/impaired                         Toilet Transfer: Min guard;Minimal assistance;Rolling walker (2 wheels);Regular Toilet                   Vision Baseline Vision/History: 2 Legally blind Additional Comments: Pt reports seeing shadows and shapes            Pertinent Vitals/Pain Pain Assessment Pain Assessment: Faces Faces Pain Scale: Hurts a little bit Pain Location: R sided back pain Pain Descriptors / Indicators: Aching, Discomfort Pain Intervention(s): Limited activity within patient's tolerance, Monitored during session, Repositioned     Hand Dominance Right   Extremity/Trunk Assessment Upper Extremity Assessment Upper Extremity Assessment: Generalized weakness   Lower Extremity Assessment Lower Extremity Assessment: Generalized weakness   Cervical / Trunk Assessment Cervical /  Trunk Assessment: Kyphotic   Communication Communication Communication: HOH   Cognition Arousal/Alertness: Awake/alert Behavior During Therapy: WFL for tasks assessed/performed Overall Cognitive Status: Within Functional Limits for tasks  assessed                                                  Home Living Family/patient expects to be discharged to:: Private residence Living Arrangements: Other (Comment) (caregiver) Available Help at Discharge: Personal care attendant;Available 24 hours/day Type of Home: House Home Access: Stairs to enter [employer] of Steps: 2 Entrance Stairs-Rails: Right;Left;Can reach both Home Layout: One level     Bathroom Shower/Tub: Chief Strategy Officer: Standard     Home Equipment: Agricultural consultant (2 wheels);Transport chair;Shower seat          Prior Functioning/Environment               Mobility Comments: reported ambulatory with RW caregiver assists with navigation due to visual deficits ADLs Comments: caregiver performs homemaking duties, assists with dressing and bathing        OT Problem List: Decreased strength;Decreased range of motion;Decreased cognition;Decreased activity tolerance;Decreased safety awareness;Impaired balance (sitting and/or standing);Decreased knowledge of use of DME or AE      OT Treatment/Interventions: Self-care/ADL training;Therapeutic exercise;Therapeutic activities;Energy conservation;DME and/or AE instruction;Patient/family education;Balance training    OT Goals(Current goals can be found in the care plan section) Acute Rehab OT Goals Patient Stated Goal: to feel better OT Goal Formulation: With patient Time For Goal Achievement: 10/25/22 Potential to Achieve Goals: Fair ADL Goals Pt Will Perform Grooming: with supervision;standing Pt Will Perform Lower Body Dressing: with supervision;sit to/from stand Pt Will Transfer to Toilet: with supervision;ambulating Pt Will Perform Toileting - Clothing Manipulation and hygiene: with supervision;sit to/from stand  OT Frequency: Min 2X/week       AM-PAC OT "6 Clicks" Daily Activity     Outcome Measure Help from another person eating meals?: None Help  from another person taking care of personal grooming?: A Little Help from another person toileting, which includes using toliet, bedpan, or urinal?: A Little Help from another person bathing (including washing, rinsing, drying)?: A Little Help from another person to put on and taking off regular upper body clothing?: None Help from another person to put on and taking off regular lower body clothing?: A Little 6 Click Score: 20   End of Session Equipment Utilized During Treatment: Rolling walker (2 wheels) Nurse Communication: Mobility status  Activity Tolerance: Patient limited by fatigue Patient left: in bed;with call bell/phone within reach;with bed alarm set  OT Visit Diagnosis: Unsteadiness on feet (R26.81);Repeated falls (R29.6);Muscle weakness (generalized) (M62.81)                Time: 5784-6962 OT Time Calculation (min): 19 min Charges:  OT General Charges $OT Visit: 1 Visit OT Evaluation $OT Eval Moderate Complexity: 1 Mod OT Treatments $Self Care/Home Management : 8-22 mins  Jackquline Denmark, MS, OTR/L , CBIS ascom [phone]  10/11/22, 4:06 PM

## 2022-10-11 NOTE — ED Notes (Signed)
Family, Aaron Gibbs, updated as to patient's status.

## 2022-10-11 NOTE — ED Provider Notes (Signed)
10:08 AM Assumed care for off going team.   Blood pressure (!) 141/112, pulse 94, temperature 98.5 F (36.9 C), temperature source Oral, resp. rate 18, SpO2 94 %.  See their HPI for full report but in brief pending SW-  On review of records patient has had intermittent desats at nighttime down to 89%.  Briefly placed on oxygen but this morning was back off oxygen.  Patient is reporting cough, shortness of breath and intermittent fevers over the last couple of days.  States that he thinks has been less than 5 days.  Amatory sat patient is desatting to 93% but has to stop frequently to catch his breath.  He states this is new for him.  He states that he feels very weak and has difficulties ambulating does not feel safe going home.  I did COVID test him and he was positive I suspect that this is an acute issue causing the above and therefore we will discuss with the hospital team for admission       Concha Se, MD 10/11/22 1009

## 2022-10-11 NOTE — Assessment & Plan Note (Signed)
Mood appears stable Continue home regimen

## 2022-10-11 NOTE — H&P (Addendum)
History and Physical    Patient: Aaron Gibbs GEX:[id number] DOB: 03-Sep-1934 DOA: 10/10/2022 DOS: the patient was seen and examined on 10/11/2022 PCP: Smitty Cords, DO  Patient coming from: Home  Chief Complaint:  Chief Complaint  Patient presents with   Fall   HPI: Aaron Gibbs is a 88 y.o. male with medical history significant of hypertension, depression, chronic pain, COPD, hearing loss, legal blindness presenting with fall, weakness, COVID-19, COPD exacerbation.  Patient reports worsening generalized weakness over the past 3 to 4 days.  Had a fall at home.  Patient reports mechanical fall associated with trying to ambulate.  Positive cough, wheezing, increased sputum production.  Positive generalized fatigue.  No head trauma or loss of consciousness associated with fall.  No chest pain or belly pain.  No nausea or vomiting.  No hemiparesis or confusion.  Patient reports quitting smoking 10 to 20 years ago.  Had 30+ pack year smoking history of until that point. Presented to the ER afebrile, hemodynamically stable.  Had some transient O2 sats around 89% and improved to the mid 90s on room air without supplemental oxygen use.  White count 5, hemoglobin 13, platelets 102, COVID-positive, creatinine 1.22 CK 45.  Imaging including CT head, CT C-spine, CT T and L-spine grossly stable.  Noted 5 cm infrarenal AAA with recommendation for 1-month follow-up. Review of Systems: As mentioned in the history of present illness. All other systems reviewed and are negative. Past Medical History:  Diagnosis Date   Anxiety    Cancer    skin   COPD (chronic obstructive pulmonary disease)    Glaucoma    Osteoporosis    osteopenia   Past Surgical History:  Procedure Laterality Date   CHOLECYSTECTOMY     TRANSURETHRAL RESECTION OF PROSTATE N/A 2008   Social History:  reports that he has quit smoking. His smoking use included cigarettes. His smokeless tobacco use includes chew. He  reports that he does not currently use alcohol. He reports that he does not use drugs.  Allergies  Allergen Reactions   Anthralin Shortness Of Breath   Azithromycin Shortness Of Breath    Family History  Problem Relation Age of Onset   Depression Mother    Heart disease Father     Prior to Admission medications   Medication Sig Start Date End Date Taking? Authorizing Provider  acetaminophen (TYLENOL) 500 MG tablet Take 1,000 mg by mouth in the morning, at noon, and at bedtime.   Yes [provider]  bumetanide (BUMEX) 1 MG tablet Take 1/2 (one-half) tablet by mouth twice daily 09/20/22  Yes Karamalegos, Netta Neat, DO  buprenorphine (BUTRANS) 10 MCG/HR PTWK Place 1 patch onto the skin once a week for 28 days. Apply only 1 patch at a time and alternate sites weekly. 10/04/22 11/01/22 Yes Delano Metz, MD  carvedilol (COREG) 12.5 MG tablet Take 1 tablet by mouth twice daily 09/14/22  Yes Karamalegos, Netta Neat, DO  COSOPT 22.3-6.8 MG/ML ophthalmic solution Place 1 drop into both eyes 2 (two) times daily. 06/25/20  Yes [provider]  DULoxetine (CYMBALTA) 30 MG capsule Take 3 capsules (90 mg total) by mouth daily. 05/08/22  Yes Karamalegos, Netta Neat, DO  finasteride (PROSCAR) 5 MG tablet Take 1 tablet (5 mg total) by mouth daily. 04/24/22  Yes Karamalegos, Netta Neat, DO  gabapentin (NEURONTIN) 300 MG capsule Take 1 capsule (300 mg total) by mouth daily. 06/06/22  Yes Karamalegos, Netta Neat, DO  Iron, Ferrous Sulfate, 325 (65 Fe)  MG TABS Take 325 mg by mouth daily with supper. 06/06/22  Yes Karamalegos, Netta Neat, DO  latanoprost (XALATAN) 0.005 % ophthalmic solution Place 1 drop into both eyes at bedtime. 02/28/22  Yes [provider]  LORazepam (ATIVAN) 0.5 MG tablet Take 1 tablet (0.5 mg total) by mouth in the morning, at noon, and at bedtime. 07/17/22  Yes Karamalegos, Netta Neat, DO  Melatonin 5 MG CAPS Take 1 capsule (5 mg total) by mouth at bedtime.  02/03/22  Yes Karamalegos, Netta Neat, DO  meloxicam (MOBIC) 15 MG tablet Take 15 mg by mouth daily. 06/14/22  Yes [provider]  OLANZapine (ZYPREXA) 2.5 MG tablet Take 1 tablet by mouth twice daily 09/29/22  Yes Baity, Salvadore Oxford, NP  pantoprazole (PROTONIX) 40 MG tablet Take 1 tablet (40 mg total) by mouth daily. 05/01/22  Yes Karamalegos, Netta Neat, DO  Propylene Glycol (SYSTANE BALANCE OP) Place 1 drop into the left eye daily.   Yes [provider]  tamsulosin (FLOMAX) 0.4 MG CAPS capsule Take 1 capsule by mouth once daily 09/21/22  Yes Karamalegos, Netta Neat, DO  traZODone (DESYREL) 150 MG tablet TAKE 1 TABLET BY MOUTH AT BEDTIME AS NEEDED 09/01/22  Yes Karamalegos, Netta Neat, DO  buprenorphine (BUTRANS) 10 MCG/HR PTWK Place 1 patch onto the skin once a week for 28 days. Apply only 1 patch at a time and alternate sites weekly. 11/01/22 11/29/22  Delano Metz, MD  buprenorphine (BUTRANS) 10 MCG/HR PTWK Place 1 patch onto the skin once a week for 28 days. Apply only 1 patch at a time and alternate sites weekly. 11/29/22 12/27/22  Delano Metz, MD  clotrimazole-betamethasone (LOTRISONE) cream Apply topically 2 (two) times daily. For 7-10 days Patient not taking: Reported on 10/11/2022 05/02/22   Smitty Cords, DO  diclofenac Sodium (VOLTAREN) 1 % GEL Apply 2 g topically at bedtime. Apply to both knees twice daily 02/10/21 09/28/22  Smitty Cords, DO  mupirocin ointment (BACTROBAN) 2 % Apply 1 Application topically 2 (two) times daily. For 1-2 weeks or until healed for superficial skin sore and abrasion Patient not taking: Reported on 10/11/2022 09/13/22   Smitty Cords, DO  nystatin (MYCOSTATIN/NYSTOP) powder Apply 1 Application topically 3 (three) times daily. Patient not taking: Reported on 10/11/2022 09/13/22   Smitty Cords, DO    Physical Exam: Vitals:   10/10/22 1939 10/10/22 2200 10/10/22 2230 10/11/22 0823  BP:   (!)  185/99 (!) 141/112  Pulse: 73 (!) 103 100 94  Resp:   20 18  Temp:   98.1 F (36.7 C) 98.5 F (36.9 C)  TempSrc:    Oral  SpO2: (!) 89% 95% 93% 94%   Physical Exam Constitutional:      Appearance: He is obese.  HENT:     Head: Normocephalic.     Nose: Nose normal.     Mouth/Throat:     Mouth: Mucous membranes are moist.  Eyes:     Pupils: Pupils are equal, round, and reactive to light.  Cardiovascular:     Rate and Rhythm: Normal rate and regular rhythm.  Pulmonary:     Effort: Pulmonary effort is normal.     Breath sounds: Wheezing present.  Abdominal:     General: Bowel sounds are normal.  Musculoskeletal:        General: Normal range of motion.     Cervical back: Normal range of motion.  Skin:    General: Skin is warm.  Neurological:  General: No focal deficit present.  Psychiatric:        Mood and Affect: Mood normal.     Data Reviewed:  There are no new results to review at this time. CT CHEST ABDOMEN PELVIS W CONTRAST CLINICAL DATA:  Polytrauma, blunt, fall, left flank pain  EXAM: CT CHEST, ABDOMEN, AND PELVIS WITH CONTRAST  TECHNIQUE: Multidetector CT imaging of the chest, abdomen and pelvis was performed following the standard protocol during bolus administration of intravenous contrast.  RADIATION DOSE REDUCTION: This exam was performed according to the departmental dose-optimization program which includes automated exposure control, adjustment of the mA and/or kV according to patient size and/or use of iterative reconstruction technique.  CONTRAST:  80mL OMNIPAQUE IOHEXOL 350 MG/ML SOLN  COMPARISON:  06/16/2022  FINDINGS: CT CHEST FINDINGS  Cardiovascular: Mild multi-vessel coronary artery calcification. Global cardiac size within normal limits. No pericardial effusion. Central pulmonary arteries are of normal caliber. Mild atherosclerotic calcification within the thoracic aorta. No aortic aneurysm.  Mediastinum/Nodes: No enlarged  mediastinal, hilar, or axillary lymph nodes. Thyroid gland, trachea, and esophagus demonstrate no significant findings.  Lungs/Pleura: Right costophrenic angle is partially excluded from view. Bibasilar parenchymal scarring. No pneumothorax or pleural effusion. Bronchial wall thickening in keeping with airway inflammation. No central obstructing lesion.  Musculoskeletal: The osseous structures are diffusely osteopenic. Degenerative changes are seen throughout the cervical, thoracic, and lumbar spine with ankylosis of the vertebral column and articular pillars bilaterally. Accentuated thoracic kyphosis. No focal lytic or blastic bone lesion. No acute bone abnormality.  CT ABDOMEN PELVIS FINDINGS  Hepatobiliary: Status post cholecystectomy. Moderate intra and extrahepatic biliary ductal dilation appears stable since prior examination likely representing post cholecystectomy change. The liver is unremarkable.  Pancreas: Unremarkable  Spleen: Unremarkable  Adrenals/Urinary Tract: The adrenal glands are unremarkable. The kidneys are normal in size and position. In indeterminate 14 mm low-attenuation lesion is seen within the posterior interpolar region of the right kidney which did may demonstrate enhancing internal components but is not optimally characterized on this examination. Adjacent simple cortical cyst noted. The kidneys are otherwise unremarkable. The bladder is unremarkable.  Stomach/Bowel: Mild sigmoid diverticulosis. The stomach, small bowel, and large bowel are otherwise unremarkable. Appendix normal. No free intraperitoneal gas or fluid.  Vascular/Lymphatic: Fusiform infrarenal abdominal aortic aneurysm demonstrates slight interval increase in size measuring 4.4 x 5.0 cm in greatest dimension on axial image # 65/7 (previously measuring 4.4 x 4.7 cm). Mild superimposed aortoiliac atherosclerotic calcification. No pathologic adenopathy within the abdomen  and pelvis.  Reproductive: Prostate is unremarkable.  Other: No abdominal wall hernia.  No abdominopelvic ascites.  Musculoskeletal: The osseous structures are diffusely osteopenic. Degenerative changes are seen within the lumbar spine and hips bilaterally. The sacroiliac joints and pubic symphysis appear fused. No acute bone abnormality. No lytic or blastic bone lesion.  IMPRESSION: 1. No acute intrathoracic or intra-abdominal injury. 2. Mild multi-vessel coronary artery calcification. 3. Mild sigmoid diverticulosis. 4. 14 mm indeterminate lesion within the posterior interpolar region of the right kidney. This may represent a complex cyst, however, this is not optimally characterized on this examination. If clinically indicated, dedicated renal mass protocol CT or MRI examination is recommended for definitive evaluation. 5. Slight interval increase in size of a 5.0 cm infrarenal abdominal aortic aneurysm. Recommend follow-up CT/MR every 6 months and vascular consultation. This recommendation follows ACR consensus guidelines: White Paper of the ACR Incidental Findings Committee II on Vascular Findings. J Am Coll Radiol 2013; 10:789-794. 6. Ankylosis of the spinal column and  sacroiliac joints in keeping with changes of underlying ankylosing spondylitis.  Aortic Atherosclerosis (ICD10-I70.0). Aortic aneurysm NOS (ICD10-I71.9).  Electronically Signed   By: Helyn Numbers M.D.   On: 10/10/2022 20:46 CT Cervical Spine Wo Contrast CLINICAL DATA:  Neck trauma  EXAM: CT HEAD WITHOUT CONTRAST  CT CERVICAL SPINE WITHOUT CONTRAST  TECHNIQUE: Multidetector CT imaging of the head and cervical spine was performed following the standard protocol without intravenous contrast. Multiplanar CT image reconstructions of the cervical spine were also generated.  RADIATION DOSE REDUCTION: This exam was performed according to the departmental dose-optimization program which includes  automated exposure control, adjustment of the mA and/or kV according to patient size and/or use of iterative reconstruction technique.  COMPARISON:  CT C SPine 12/13/19  FINDINGS: CT HEAD FINDINGS  Brain: No evidence of acute infarction, hemorrhage, hydrocephalus, extra-axial collection or mass lesion/mass effect. Sequela of moderate chronic microvascular ischemic change.  Vascular: No hyperdense vessel or unexpected calcification.  Skull: Normal. Negative for fracture or focal lesion.  Sinuses/Orbits: No middle ear or mastoid effusion. Mucosal thickening right maxillary sinus and mild mucosal thickening in the bilateral ethmoid and left maxillary sinus. Bilateral lens replacement. Orbits are otherwise unremarkable.  Other: None.  CT CERVICAL SPINE FINDINGS  Alignment: Normal.  Skull base and vertebrae: No acute fracture. There are osseous findings of DISH with multiple bridging anterior osteophytes. There is a lytic lesion the base of the dens extending to the C2 vertebral body (series 8, image 26), unchanged from 2021. There is also an additional lucent lesion in the right occipital bone (series 5, image 6).  Soft tissues and spinal canal: No prevertebral fluid or swelling. No visible canal hematoma.  Disc levels:  No evidence of high-grade spinal canal stenosis.  Upper chest: See separately dictated CT chest abdomen and pelvis for additional findings.  Other: None  IMPRESSION: 1. No acute intracranial abnormality. 2. No acute cervical spine fracture.  Electronically Signed   By: Lorenza Cambridge M.D.   On: 10/10/2022 20:37 CT Head Wo Contrast CLINICAL DATA:  Neck trauma  EXAM: CT HEAD WITHOUT CONTRAST  CT CERVICAL SPINE WITHOUT CONTRAST  TECHNIQUE: Multidetector CT imaging of the head and cervical spine was performed following the standard protocol without intravenous contrast. Multiplanar CT image reconstructions of the cervical spine were also  generated.  RADIATION DOSE REDUCTION: This exam was performed according to the departmental dose-optimization program which includes automated exposure control, adjustment of the mA and/or kV according to patient size and/or use of iterative reconstruction technique.  COMPARISON:  CT C SPine 12/13/19  FINDINGS: CT HEAD FINDINGS  Brain: No evidence of acute infarction, hemorrhage, hydrocephalus, extra-axial collection or mass lesion/mass effect. Sequela of moderate chronic microvascular ischemic change.  Vascular: No hyperdense vessel or unexpected calcification.  Skull: Normal. Negative for fracture or focal lesion.  Sinuses/Orbits: No middle ear or mastoid effusion. Mucosal thickening right maxillary sinus and mild mucosal thickening in the bilateral ethmoid and left maxillary sinus. Bilateral lens replacement. Orbits are otherwise unremarkable.  Other: None.  CT CERVICAL SPINE FINDINGS  Alignment: Normal.  Skull base and vertebrae: No acute fracture. There are osseous findings of DISH with multiple bridging anterior osteophytes. There is a lytic lesion the base of the dens extending to the C2 vertebral body (series 8, image 26), unchanged from 2021. There is also an additional lucent lesion in the right occipital bone (series 5, image 6).  Soft tissues and spinal canal: No prevertebral fluid or swelling. No visible canal hematoma.  Disc levels:  No evidence of high-grade spinal canal stenosis.  Upper chest: See separately dictated CT chest abdomen and pelvis for additional findings.  Other: None  IMPRESSION: 1. No acute intracranial abnormality. 2. No acute cervical spine fracture.  Electronically Signed   By: Lorenza Cambridge M.D.   On: 10/10/2022 20:37 CT L-SPINE NO CHARGE CLINICAL DATA:  Trip and fall injury.  EXAM: CT Thoracic and Lumbar spine with contrast  TECHNIQUE: Multiplanar CT images of the thoracic and lumbar spine were reconstructed from  contemporary CT of the Chest, Abdomen, and Pelvis.  RADIATION DOSE REDUCTION: This exam was performed according to the departmental dose-optimization program which includes automated exposure control, adjustment of the mA and/or kV according to patient size and/or use of iterative reconstruction technique.  CONTRAST:  No additional IV contrast material was used.  COMPARISON:  CT chest 06/16/2022  FINDINGS: CT THORACIC SPINE FINDINGS  Alignment: Thoracic kyphosis without anterior subluxation.  Vertebrae: No vertebral compression deformities. No focal bone lesion or bone destruction. Bridging anterior osteophyte or ligamentous calcification consistent with ankylosis.  Paraspinal and other soft tissues: Soft tissue swelling and infiltration in the posterior paraspinal muscles in the midthoracic region towards the left likely representing soft tissue hematoma.  Disc levels: Intervertebral disc spaces are preserved.  CT LUMBAR SPINE FINDINGS  Segmentation: 5 lumbar type vertebrae.  Alignment: Normal alignment.  Vertebrae: No vertebral compression deformities. No focal bone lesion or bone destruction. Bridging calcification anteriorly suggesting ankylosis. Ankylosis of the sacroiliac joints.  Paraspinal and other soft tissues: Fatty atrophy of the posterior paraspinal muscles. No abnormal soft tissue swelling or infiltration.  Disc levels: Intervertebral disc spaces are preserved.  IMPRESSION: 1. Thoracic kyphosis. 2. 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 3. No acute displaced fractures are identified.  Electronically Signed   By: Burman Nieves M.D.   On: 10/10/2022 20:35 CT T-SPINE NO CHARGE CLINICAL DATA:  Trip and fall injury.  EXAM: CT Thoracic and Lumbar spine with contrast  TECHNIQUE: Multiplanar CT images of the thoracic and lumbar spine were reconstructed from contemporary CT of the Chest, Abdomen, and Pelvis.  RADIATION DOSE REDUCTION:  This exam was performed according to the departmental dose-optimization program which includes automated exposure control, adjustment of the mA and/or kV according to patient size and/or use of iterative reconstruction technique.  CONTRAST:  No additional IV contrast material was used.  COMPARISON:  CT chest 06/16/2022  FINDINGS: CT THORACIC SPINE FINDINGS  Alignment: Thoracic kyphosis without anterior subluxation.  Vertebrae: No vertebral compression deformities. No focal bone lesion or bone destruction. Bridging anterior osteophyte or ligamentous calcification consistent with ankylosis.  Paraspinal and other soft tissues: Soft tissue swelling and infiltration in the posterior paraspinal muscles in the midthoracic region towards the left likely representing soft tissue hematoma.  Disc levels: Intervertebral disc spaces are preserved.  CT LUMBAR SPINE FINDINGS  Segmentation: 5 lumbar type vertebrae.  Alignment: Normal alignment.  Vertebrae: No vertebral compression deformities. No focal bone lesion or bone destruction. Bridging calcification anteriorly suggesting ankylosis. Ankylosis of the sacroiliac joints.  Paraspinal and other soft tissues: Fatty atrophy of the posterior paraspinal muscles. No abnormal soft tissue swelling or infiltration.  Disc levels: Intervertebral disc spaces are preserved.  IMPRESSION: 1. Thoracic kyphosis. 2. 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 3. No acute displaced fractures are identified.  Electronically Signed   By: Burman Nieves M.D.   On: 10/10/2022 20:35  Lab Results  Component Value Date   WBC 5.0  10/10/2022   HGB 13.0 10/10/2022   HCT 41.2 10/10/2022   MCV 92.6 10/10/2022   PLT 102 (L) 10/10/2022   Last metabolic panel Lab Results  Component Value Date   GLUCOSE 136 (H) 10/10/2022   NA 141 10/10/2022   K 4.0 10/10/2022   CL 108 10/10/2022   CO2 26 10/10/2022   BUN 26 (H) 10/10/2022   CREATININE  1.22 10/10/2022   GFRNONAA 58 (L) 10/10/2022   CALCIUM 8.9 10/10/2022   PHOS 3.4 03/30/2019   PROT 6.6 10/10/2022   ALBUMIN 3.7 10/10/2022   LABGLOB 2.7 06/09/2022   AGRATIO 2.1 07/10/2018   BILITOT 0.7 10/10/2022   ALKPHOS 57 10/10/2022   AST 25 10/10/2022   ALT 12 10/10/2022   ANIONGAP 7 10/10/2022    Assessment and Plan: * COVID-19 Positive generalized malaise, cough, increased work of breathing wheezing x 3 to 4 days COVID-positive Suspect overlapping exacerbation of COPD in the setting of 30+ pack year smoking history Decadron and COPD treatment Supplemental oxygen as needed Follow    COPD (chronic obstructive pulmonary disease) Positive COPD exacerbation in setting of COVID-19 Positive cough wheezing and sputum production Decadron IV doxycycline and DuoNebs Supplemental oxygen as needed Follow  Weakness Acute decompensation of baseline weakness setting of COVID-19 COPD exacerbation Noted secondary to fall PT OT evaluation Treat COVID and COPD Fall precautions Follow  Thrombocytopenia Plt count in low 100s in setting of covid 19  Looks to be acute on chronic issue  Hgb stage at 13  Check peripheral smear  Trend   Fall at home, initial encounter Mechanical fall at home in the setting of generalized weakness associated with COVID-19 Imaging including CT of the head, C, T and L-spine grossly stable PT OT evaluation Fall precautions  Major depressive disorder, recurrent episode, severe Mood appears stable Continue home regimen  GERD (gastroesophageal reflux disease) PPI  Essential hypertension BP stable Titrate home regimen      Advance Care Planning:   Code Status: Full Code   Consults: None   Family Communication: No family at the bedside   Greater than 50% was spent in counseling and coordination of care with patient Total encounter time 80 minutes or more   Severity of Illness: The appropriate patient status for this patient is  OBSERVATION. Observation status is judged to be reasonable and necessary in order to provide the required intensity of service to ensure the patient's safety. The patient's presenting symptoms, physical exam findings, and initial radiographic and laboratory data in the context of their medical condition is felt to place them at decreased risk for further clinical deterioration. Furthermore, it is anticipated that the patient will be medically stable for discharge from the hospital within 2 midnights of admission.   Author: Floydene Flock, MD 10/11/2022 11:17 AM  For on call review [email or web address].

## 2022-10-11 NOTE — Assessment & Plan Note (Signed)
Positive COPD exacerbation in setting of COVID-19 Positive cough wheezing and sputum production Decadron IV doxycycline and DuoNebs Supplemental oxygen as needed Follow

## 2022-10-11 NOTE — ED Notes (Signed)
Pt given lunch tray. Says he does not want anything right now. Drank two cartons of milk around 1100. Call light within reach if pt needs assistance with meal setup

## 2022-10-11 NOTE — ED Notes (Signed)
Pt ambulated to bathroom from hall bed with walker and assistance from NT and this RN. Pt became SOB but tolerated ambulation. Caregiver at bedside.

## 2022-10-11 NOTE — Assessment & Plan Note (Addendum)
Plt count in low 100s in setting of covid 19  Looks to be acute on chronic issue  Hgb stage at 13  Check peripheral smear  Trend

## 2022-10-11 NOTE — Assessment & Plan Note (Addendum)
Positive generalized malaise, cough, increased work of breathing wheezing x 3 to 4 days COVID-positive Suspect overlapping exacerbation of COPD in the setting of 30+ pack year smoking history Decadron and COPD treatment Supplemental oxygen as needed Follow

## 2022-10-12 ENCOUNTER — Encounter: Payer: Self-pay | Admitting: Family Medicine

## 2022-10-12 ENCOUNTER — Ambulatory Visit: Payer: Medicare Other | Admitting: Family Medicine

## 2022-10-12 DIAGNOSIS — J441 Chronic obstructive pulmonary disease with (acute) exacerbation: Secondary | ICD-10-CM | POA: Diagnosis present

## 2022-10-12 DIAGNOSIS — Z818 Family history of other mental and behavioral disorders: Secondary | ICD-10-CM | POA: Diagnosis not present

## 2022-10-12 DIAGNOSIS — H409 Unspecified glaucoma: Secondary | ICD-10-CM | POA: Diagnosis present

## 2022-10-12 DIAGNOSIS — N289 Disorder of kidney and ureter, unspecified: Secondary | ICD-10-CM | POA: Diagnosis present

## 2022-10-12 DIAGNOSIS — F332 Major depressive disorder, recurrent severe without psychotic features: Secondary | ICD-10-CM | POA: Diagnosis present

## 2022-10-12 DIAGNOSIS — D696 Thrombocytopenia, unspecified: Secondary | ICD-10-CM | POA: Diagnosis present

## 2022-10-12 DIAGNOSIS — K219 Gastro-esophageal reflux disease without esophagitis: Secondary | ICD-10-CM | POA: Diagnosis present

## 2022-10-12 DIAGNOSIS — I7143 Infrarenal abdominal aortic aneurysm, without rupture: Secondary | ICD-10-CM | POA: Diagnosis present

## 2022-10-12 DIAGNOSIS — Z881 Allergy status to other antibiotic agents status: Secondary | ICD-10-CM | POA: Diagnosis not present

## 2022-10-12 DIAGNOSIS — F17220 Nicotine dependence, chewing tobacco, uncomplicated: Secondary | ICD-10-CM | POA: Diagnosis present

## 2022-10-12 DIAGNOSIS — Z8616 Personal history of COVID-19: Secondary | ICD-10-CM | POA: Diagnosis not present

## 2022-10-12 DIAGNOSIS — Z888 Allergy status to other drugs, medicaments and biological substances status: Secondary | ICD-10-CM | POA: Diagnosis not present

## 2022-10-12 DIAGNOSIS — Z79899 Other long term (current) drug therapy: Secondary | ICD-10-CM | POA: Diagnosis not present

## 2022-10-12 DIAGNOSIS — Z66 Do not resuscitate: Secondary | ICD-10-CM | POA: Diagnosis not present

## 2022-10-12 DIAGNOSIS — I10 Essential (primary) hypertension: Secondary | ICD-10-CM | POA: Diagnosis present

## 2022-10-12 DIAGNOSIS — F419 Anxiety disorder, unspecified: Secondary | ICD-10-CM | POA: Diagnosis present

## 2022-10-12 DIAGNOSIS — H548 Legal blindness, as defined in USA: Secondary | ICD-10-CM | POA: Diagnosis present

## 2022-10-12 DIAGNOSIS — Z791 Long term (current) use of non-steroidal anti-inflammatories (NSAID): Secondary | ICD-10-CM | POA: Diagnosis not present

## 2022-10-12 DIAGNOSIS — Y92009 Unspecified place in unspecified non-institutional (private) residence as the place of occurrence of the external cause: Secondary | ICD-10-CM | POA: Diagnosis not present

## 2022-10-12 DIAGNOSIS — G8929 Other chronic pain: Secondary | ICD-10-CM | POA: Diagnosis present

## 2022-10-12 DIAGNOSIS — M810 Age-related osteoporosis without current pathological fracture: Secondary | ICD-10-CM | POA: Diagnosis present

## 2022-10-12 DIAGNOSIS — H9190 Unspecified hearing loss, unspecified ear: Secondary | ICD-10-CM | POA: Diagnosis present

## 2022-10-12 DIAGNOSIS — U071 COVID-19: Secondary | ICD-10-CM | POA: Diagnosis present

## 2022-10-12 DIAGNOSIS — W19XXXA Unspecified fall, initial encounter: Secondary | ICD-10-CM | POA: Diagnosis not present

## 2022-10-12 DIAGNOSIS — R531 Weakness: Secondary | ICD-10-CM | POA: Diagnosis not present

## 2022-10-12 DIAGNOSIS — M549 Dorsalgia, unspecified: Secondary | ICD-10-CM | POA: Diagnosis present

## 2022-10-12 DIAGNOSIS — Z8249 Family history of ischemic heart disease and other diseases of the circulatory system: Secondary | ICD-10-CM | POA: Diagnosis not present

## 2022-10-12 LAB — COMPREHENSIVE METABOLIC PANEL
ALT: 18 U/L (ref 0–44)
AST: 28 U/L (ref 15–41)
Albumin: 3.3 g/dL — ABNORMAL LOW (ref 3.5–5.0)
Alkaline Phosphatase: 47 U/L (ref 38–126)
Anion gap: 9 (ref 5–15)
BUN: 26 mg/dL — ABNORMAL HIGH (ref 8–23)
CO2: 21 mmol/L — ABNORMAL LOW (ref 22–32)
Calcium: 8.7 mg/dL — ABNORMAL LOW (ref 8.9–10.3)
Chloride: 112 mmol/L — ABNORMAL HIGH (ref 98–111)
Creatinine, Ser: 1.15 mg/dL (ref 0.61–1.24)
GFR, Estimated: >60 mL/min (ref >60)
Glucose, Bld: 136 mg/dL — ABNORMAL HIGH (ref 70–99)
Potassium: 3.6 mmol/L (ref 3.5–5.1)
Sodium: 142 mmol/L (ref 135–145)
Total Bilirubin: 0.6 mg/dL (ref 0.3–1.2)
Total Protein: 6.4 g/dL — ABNORMAL LOW (ref 6.5–8.1)

## 2022-10-12 LAB — DIFFERENTIAL

## 2022-10-12 LAB — CBC
HCT: 35.9 % — ABNORMAL LOW (ref 39.0–52.0)
Hemoglobin: 11.3 g/dL — ABNORMAL LOW (ref 13.0–17.0)
MCH: 29.1 pg (ref 26.0–34.0)
MCHC: 31.5 g/dL (ref 30.0–36.0)
MCV: 92.5 fL (ref 80.0–100.0)
Platelets: 99 10*3/uL — ABNORMAL LOW (ref 150–400)
RBC: 3.88 MIL/uL — ABNORMAL LOW (ref 4.22–5.81)
RDW: 15.5 % (ref 11.5–15.5)
WBC: 3.9 10*3/uL — ABNORMAL LOW (ref 4.0–10.5)
nRBC: 0 % (ref 0.0–0.2)

## 2022-10-12 MED ORDER — DORZOLAMIDE HCL-TIMOLOL MAL 2-0.5 % OP SOLN
1.0000 [drp] | Freq: Two times a day (BID) | OPHTHALMIC | Status: DC
  Administered 2022-10-12 – 2022-10-16 (×9): 1 [drp] via OPHTHALMIC
  Filled 2022-10-12: qty 10

## 2022-10-12 MED ORDER — OXYCODONE HCL 5 MG PO TABS
5.0000 mg | ORAL_TABLET | Freq: Three times a day (TID) | ORAL | Status: DC | PRN
  Administered 2022-10-12 – 2022-10-16 (×11): 5 mg via ORAL
  Filled 2022-10-12 (×11): qty 1

## 2022-10-12 MED ORDER — LATANOPROST 0.005 % OP SOLN
1.0000 [drp] | Freq: Every day | OPHTHALMIC | Status: DC
  Administered 2022-10-12 – 2022-10-15 (×4): 1 [drp] via OPHTHALMIC
  Filled 2022-10-12: qty 2.5

## 2022-10-12 NOTE — TOC Progression Note (Signed)
Transition of Care [facility]) - Progression Note    Patient Details  Name: Aaron Gibbs MRN: [id number] Date of Birth: Oct 23, 1934  [facility]) CM/SW Contact  Allena Katz, LCSW Phone Number: 10/12/2022, 1:39 PM  Clinical Narrative:   CSW attempted to deliver MOON. Pt on contact precautions for COVID and blind. CSW attempted to deliver via phone to patient and two of his emergency contacts but unable to reach. CSW will attempt again later in admission.         Expected Discharge Plan and Services                                               Social Determinants of Health (SDOH) Interventions SDOH Screenings   Food Insecurity: No Food Insecurity (10/11/2022)  Housing: Low Risk  (10/11/2022)  Transportation Needs: No Transportation Needs (10/11/2022)  Utilities: Not At Risk (10/11/2022)  Alcohol Screen: Low Risk  (07/13/2022)  Depression (PHQ2-9): Low Risk  (09/28/2022)  Recent Concern: Depression (PHQ2-9) - High Risk (09/13/2022)  Financial Resource Strain: Low Risk  (07/13/2022)  Physical Activity: Inactive (07/13/2022)  Social Connections: Socially Isolated (07/13/2022)  Stress: Stress Concern Present (07/13/2022)  Tobacco Use: High Risk (10/12/2022)    Readmission Risk Interventions     No data to display

## 2022-10-12 NOTE — Care Management Obs Status (Signed)
MEDICARE OBSERVATION STATUS NOTIFICATION   Patient Details  Name: Aaron Gibbs MRN: [id number] Date of Birth: 1934-11-18   Medicare Observation Status Notification Given:  Yes    Real Cona, LCSW 10/12/2022, 1:50 PM

## 2022-10-12 NOTE — Progress Notes (Signed)
Physical Therapy Treatment Patient Details Name: Aaron Gibbs MRN: [id number] DOB: 01-07-1935 Today's Date: 10/12/2022   History of Present Illness Aaron Gibbs is an 88 y.o. male with medical history significant for COPD, hypertension, hyperlipidemia, anxiety/depression with history of suicidal ideation, and chronic pain syndrome who presents to the ED for SOB and reported inability to care for himself.    PT Comments    Patient alert, agreeable to mobility with encouragement, pt reported feeling limited due to back pain and SOB. Bed mobility with supervision, and improved ability to transfer today, CGA but still did require several attempts prior to success. He ambulated ~51ft in room CGA but needed assistance with RW navigation due to visual limitations. Returned to supine in bed at pt request, declined recliner. The patient would benefit from further skilled PT intervention to continue to progress towards goals.    Recommendations for follow up therapy are one component of a multi-disciplinary discharge planning process, led by the attending physician.  Recommendations may be updated based on patient status, additional functional criteria and insurance authorization.  Follow Up Recommendations       Assistance Recommended at Discharge Frequent or constant Supervision/Assistance  Patient can return home with the following Assistance with cooking/housework;Direct supervision/assist for medications management;Assist for transportation;Help with stairs or ramp for entrance;Assistance with feeding;A little help with bathing/dressing/bathroom;A little help with walking and/or transfers   Equipment Recommendations  None recommended by PT    Recommendations for Other Services       Precautions / Restrictions Precautions Precautions: Fall Restrictions Weight Bearing Restrictions: No     Mobility  Bed Mobility Overal bed mobility: Needs Assistance Bed Mobility: Supine to Sit,  Sit to Supine     Supine to sit: Supervision Sit to supine: Supervision        Transfers Overall transfer level: Needs assistance Equipment used: Rolling walker (2 wheels) Transfers: Sit to/from Stand Sit to Stand: Min guard           General transfer comment: able to do after several attempts, no physical assistance needed    Ambulation/Gait Ambulation/Gait assistance: Min guard Gait Distance (Feet): 25 Feet Assistive device: Rolling walker (2 wheels)         General Gait Details: assistance with RW guidance, no physical assistance to ambulate.   Stairs             Wheelchair Mobility    Modified Rankin (Stroke Patients Only)       Balance Overall balance assessment: Needs assistance Sitting-balance support: Feet supported Sitting balance-Leahy Scale: Good     Standing balance support: Reliant on assistive device for balance Standing balance-Leahy Scale: Fair                              Cognition Arousal/Alertness: Awake/alert Behavior During Therapy: WFL for tasks assessed/performed Overall Cognitive Status: Within Functional Limits for tasks assessed                                          Exercises      General Comments        Pertinent Vitals/Pain Pain Assessment Pain Location: R sided back pain Pain Descriptors / Indicators: Aching, Discomfort, Sore Pain Intervention(s): Limited activity within patient's tolerance, Monitored during session, Repositioned    Home Living  Prior Function            PT Goals (current goals can now be found in the care plan section) Progress towards PT goals: Progressing toward goals    Frequency    Min 3X/week      PT Plan Current plan remains appropriate    Co-evaluation              AM-PAC PT "6 Clicks" Mobility   Outcome Measure  Help needed turning from your back to your side while in a flat bed without  using bedrails?: None Help needed moving from lying on your back to sitting on the side of a flat bed without using bedrails?: None Help needed moving to and from a bed to a chair (including a wheelchair)?: None Help needed standing up from a chair using your arms (e.g., wheelchair or bedside chair)?: A Little Help needed to walk in hospital room?: A Little Help needed climbing 3-5 steps with a railing? : A Little 6 Click Score: 21    End of Session   Activity Tolerance: Patient limited by pain Patient left: in bed;with call bell/phone within reach;with bed alarm set Nurse Communication: Mobility status PT Visit Diagnosis: Other abnormalities of gait and mobility (R26.89);Muscle weakness (generalized) (M62.81)     Time: 1610-9604 PT Time Calculation (min) (ACUTE ONLY): 20 min  Charges:  $Therapeutic Activity: 8-22 mins                     Olga Coaster PT, DPT 2:00 PM,10/12/22

## 2022-10-12 NOTE — TOC Progression Note (Signed)
Transition of Care [facility]) - Progression Note    Patient Details  Name: Aaron Gibbs MRN: [id number] Date of Birth: 06/16/1935  [facility] CM/SW Contact  Allena Katz, LCSW Phone Number: 10/12/2022, 1:53 PM  Clinical Narrative:   CSW spoke with patients daughter who reports that pt is active with [facility] and would like to resume these services at discharge. CSW will follow up with Barbara Cower with [facility].         Expected Discharge Plan and Services                                               Social Determinants of Health (SDOH) Interventions SDOH Screenings   Food Insecurity: No Food Insecurity (10/11/2022)  Housing: Low Risk  (10/11/2022)  Transportation Needs: No Transportation Needs (10/11/2022)  Utilities: Not At Risk (10/11/2022)  Alcohol Screen: Low Risk  (07/13/2022)  Depression (PHQ2-9): Low Risk  (09/28/2022)  Recent Concern: Depression (PHQ2-9) - High Risk (09/13/2022)  Financial Resource Strain: Low Risk  (07/13/2022)  Physical Activity: Inactive (07/13/2022)  Social Connections: Socially Isolated (07/13/2022)  Stress: Stress Concern Present (07/13/2022)  Tobacco Use: High Risk (10/12/2022)    Readmission Risk Interventions     No data to display

## 2022-10-12 NOTE — Progress Notes (Addendum)
Progress Note    Aaron Gibbs  WUJ:[id number] DOB: 07-Mar-1935  DOA: 10/10/2022 PCP: Smitty Cords, DO      Brief Narrative:    Medical records reviewed and are as summarized below:  Aaron Gibbs is a 88 y.o. male with medical history significant for hypertension, depression, chronic pain, COPD, hearing impairment, glucoma, legal blindness, anxiety, osteoporosis, chronic thrombocytopenia, depression, GERD, who presented to the hospital with general weakness and after a fall at home.  He also complained of cough, increasing sputum production, fatigue and wheezing.  He has about 30+ pack year smoking history.  He said he quit smoking about 15 years ago. He tested positive for COVID-19 infection in the emergency department.  He was admitted to the hospital for COPD exacerbation, COVID-19 infection, general weakness and mechanical fall.    Assessment/Plan:   Principal Problem:   COVID-19 Active Problems:   COPD (chronic obstructive pulmonary disease)   Weakness   Essential hypertension   GERD (gastroesophageal reflux disease)   Major depressive disorder, recurrent episode, severe   Fall at home, initial encounter   Thrombocytopenia    COPD exacerbation: Continue IV dexamethasone, bronchodilators and IV doxycycline.   COVID-19 infection: Robitussin as needed for cough   General weakness, s/p fall: No acute fracture noted on CT cervical, thoracic and lumbar spine.  No acute abnormality found on CT chest abdomen and pelvis.  PT recommended home health therapy.   1.4 cm right renal lesion: Outpatient follow-up with PCP or urologist.   5 cm infrarenal abdominal aortic aneurysm: Follow-up with CT or MRI abdomen every 6 months recommended.  Outpatient follow-up with vascular surgeon.   Acute on chronic thrombocytopenia: Platelet count probably worsened by acute viral infection.    Other comorbidities include chronic pain on buprenorphine, legal  blindness, hearing impairment, depression, anxiety, hypertension   Diet Order             Diet heart healthy/carb modified Room service appropriate? Yes; Fluid consistency: Thin  Diet effective now                            Consultants: None  Procedures: None    Medications:    bumetanide  0.5 mg Oral BID   buprenorphine  1 patch Transdermal Weekly   dexamethasone (DECADRON) injection  6 mg Intravenous Q24H   dorzolamide-timolol  1 drop Both Eyes BID   enoxaparin (LOVENOX) injection  40 mg Subcutaneous Q24H   ferrous sulfate  325 mg Oral Q supper   gabapentin  300 mg Oral Daily   latanoprost  1 drop Both Eyes QHS   LORazepam  0.5 mg Oral TID with meals   melatonin  5 mg Oral QHS   meloxicam  15 mg Oral Daily   pantoprazole  40 mg Oral Daily   tamsulosin  0.4 mg Oral Daily   Continuous Infusions:  doxycycline (VIBRAMYCIN) IV 100 mg (10/12/22 0032)     Anti-infectives (From admission, onward)    Start     Dose/Rate Route Frequency Ordered Stop   10/11/22 1200  doxycycline (VIBRAMYCIN) 100 mg in sodium chloride 0.9 % 250 mL IVPB        100 mg 125 mL/hr over 120 Minutes Intravenous Every 12 hours 10/11/22 1113                Family Communication/Anticipated D/C date and plan/Code Status   DVT prophylaxis: enoxaparin (LOVENOX) injection 40 mg Start:  10/11/22 2200 SCDs Start: 10/11/22 1103     Code Status: Full Code  Family Communication: None Disposition Plan: Plan to discharge home in 1 to 2 days   Status is: Observation The patient will require care spanning > 2 midnights and should be moved to inpatient because: COPD exacerbation       Subjective:   Interval events noted.  He complains of cough, back pain.  Breathing is a little better.  No chest pain or wheezing.  Objective:    Vitals:   10/11/22 1137 10/11/22 1614 10/11/22 2053 10/12/22 0739  BP: (!) 153/57 (!) 143/71 (!) 145/91 (!) 177/90  Pulse: 91 77 87 70  Resp:  20 20 20  (!) 22  Temp:  98.1 F (36.7 C) 98.3 F (36.8 C) 98 F (36.7 C)  TempSrc:      SpO2: 94% 96% 92% 96%   No data found.   Intake/Output Summary (Last 24 hours) at 10/12/2022 1610 Last data filed at 10/12/2022 0014 Gross per 24 hour  Intake 711.67 ml  Output 200 ml  Net 511.67 ml   There were no vitals filed for this visit.  Exam:  GEN: NAD SKIN: Warm and dry EYES: No pallor or icterus ENT: MMM CV: RRR PULM: Decreased air bilaterally.  No wheezing or rales heard. ABD: soft, obese, NT, +BS CNS: AAO x 3, non focal EXT: No edema or tenderness MSK: Kyphosis, lumbar spinal tenderness      Data Reviewed:   I have personally reviewed following labs and imaging studies:  Labs: Labs show the following:   Basic Metabolic Panel: Recent Labs  Lab 10/10/22 1919 10/12/22 0611  NA 141 142  K 4.0 3.6  CL 108 112*  CO2 26 21*  GLUCOSE 136* 136*  BUN 26* 26*  CREATININE 1.22 1.15  CALCIUM 8.9 8.7*   GFR Estimated Creatinine Clearance: 54.4 mL/min (by C-G formula based on SCr of 1.15 mg/dL). Liver Function Tests: Recent Labs  Lab 10/10/22 1919 10/12/22 0611  AST 25 28  ALT 12 18  ALKPHOS 57 47  BILITOT 0.7 0.6  PROT 6.6 6.4*  ALBUMIN 3.7 3.3*   Recent Labs  Lab 10/10/22 1919  LIPASE 20   No results for input(s): "AMMONIA" in the last 168 hours. Coagulation profile No results for input(s): "INR", "PROTIME" in the last 168 hours.  CBC: Recent Labs  Lab 10/10/22 1919 10/11/22 1201 10/11/22 2050 10/12/22 0611  WBC 5.0 3.7* 3.8* 3.9*  NEUTROABS 2.4 DUPLICATE REQUEST  1.5* 2.3  --   HGB 13.0 11.9* 12.5* 11.3*  HCT 41.2 37.4* 39.7 35.9*  MCV 92.6 92.8 91.1 92.5  PLT 102* 95* 110* 99*   Cardiac Enzymes: Recent Labs  Lab 10/10/22 1919  CKTOTAL 45*   BNP (last 3 results) No results for input(s): "PROBNP" in the last 8760 hours. CBG: No results for input(s): "GLUCAP" in the last 168 hours. D-Dimer: No results for input(s): "DDIMER" in  the last 72 hours. Hgb A1c: No results for input(s): "HGBA1C" in the last 72 hours. Lipid Profile: No results for input(s): "CHOL", "HDL", "LDLCALC", "TRIG", "CHOLHDL", "LDLDIRECT" in the last 72 hours. Thyroid function studies: No results for input(s): "TSH", "T4TOTAL", "T3FREE", "THYROIDAB" in the last 72 hours.  Invalid input(s): "FREET3" Anemia work up: No results for input(s): "VITAMINB12", "FOLATE", "FERRITIN", "TIBC", "IRON", "RETICCTPCT" in the last 72 hours. Sepsis Labs: Recent Labs  Lab 10/10/22 1919 10/11/22 1201 10/11/22 2050 10/12/22 0611  WBC 5.0 3.7* 3.8* 3.9*  Microbiology Recent Results (from the past 240 hour(s))  SARS Coronavirus 2 by RT PCR (hospital order, performed in [facility] hospital lab) *cepheid single result test* Anterior Nasal Swab     Status: Abnormal   Collection Time: 10/11/22  8:30 AM   Specimen: Anterior Nasal Swab  Result Value Ref Range Status   SARS Coronavirus 2 by RT PCR POSITIVE (A) NEGATIVE Final    Comment: (NOTE) SARS-CoV-2 target nucleic acids are DETECTED  SARS-CoV-2 RNA is generally detectable in upper respiratory specimens  during the acute phase of infection.  Positive results are indicative  of the presence of the identified virus, but do not rule out bacterial infection or co-infection with other pathogens not detected by the test.  Clinical correlation with patient history and  other diagnostic information is necessary to determine patient infection status.  The expected result is negative.  Fact Sheet for Patients:   RoadLapTop.co.za   Fact Sheet for Healthcare Providers:   http://kim-miller.com/    This test is not yet approved or cleared by the [place] FDA and  has been authorized for detection and/or diagnosis of SARS-CoV-2 by FDA under an Emergency Use Authorization (EUA).  This EUA will remain in effect (meaning this test can be used) for the duration of   the COVID-19 declaration under Section 564(b)(1)  of the Act, 21 U.S.C. section 360-bbb-3(b)(1), unless the authorization is terminated or revoked sooner.   Performed at [facility], [address] [place], [place] [address]     Procedures and diagnostic studies:  CT CHEST ABDOMEN PELVIS W CONTRAST  Result Date: 10/10/2022 CLINICAL DATA:  Polytrauma, blunt, fall, left flank pain EXAM: CT CHEST, ABDOMEN, AND PELVIS WITH CONTRAST TECHNIQUE: Multidetector CT imaging of the chest, abdomen and pelvis was performed following the standard protocol during bolus administration of intravenous contrast. RADIATION DOSE REDUCTION: This exam was performed according to the departmental dose-optimization program which includes automated exposure control, adjustment of the mA and/or kV according to patient size and/or use of iterative reconstruction technique. CONTRAST:  80mL OMNIPAQUE IOHEXOL 350 MG/ML SOLN COMPARISON:  06/16/2022 FINDINGS: CT CHEST FINDINGS Cardiovascular: Mild multi-vessel coronary artery calcification. Global cardiac size within normal limits. No pericardial effusion. Central pulmonary arteries are of normal caliber. Mild atherosclerotic calcification within the thoracic aorta. No aortic aneurysm. Mediastinum/Nodes: No enlarged mediastinal, hilar, or axillary lymph nodes. Thyroid gland, trachea, and esophagus demonstrate no significant findings. Lungs/Pleura: Right costophrenic angle is partially excluded from view. Bibasilar parenchymal scarring. No pneumothorax or pleural effusion. Bronchial wall thickening in keeping with airway inflammation. No central obstructing lesion. Musculoskeletal: The osseous structures are diffusely osteopenic. Degenerative changes are seen throughout the cervical, thoracic, and lumbar spine with ankylosis of the vertebral column and articular pillars bilaterally. Accentuated thoracic kyphosis. No focal lytic or blastic bone lesion. No acute bone  abnormality. CT ABDOMEN PELVIS FINDINGS Hepatobiliary: Status post cholecystectomy. Moderate intra and extrahepatic biliary ductal dilation appears stable since prior examination likely representing post cholecystectomy change. The liver is unremarkable. Pancreas: Unremarkable Spleen: Unremarkable Adrenals/Urinary Tract: The adrenal glands are unremarkable. The kidneys are normal in size and position. In indeterminate 14 mm low-attenuation lesion is seen within the posterior interpolar region of the right kidney which did may demonstrate enhancing internal components but is not optimally characterized on this examination. Adjacent simple cortical cyst noted. The kidneys are otherwise unremarkable. The bladder is unremarkable. Stomach/Bowel: Mild sigmoid diverticulosis. The stomach, small bowel, and large bowel are otherwise unremarkable. Appendix normal. No free intraperitoneal  gas or fluid. Vascular/Lymphatic: Fusiform infrarenal abdominal aortic aneurysm demonstrates slight interval increase in size measuring 4.4 x 5.0 cm in greatest dimension on axial image # 65/7 (previously measuring 4.4 x 4.7 cm). Mild superimposed aortoiliac atherosclerotic calcification. No pathologic adenopathy within the abdomen and pelvis. Reproductive: Prostate is unremarkable. Other: No abdominal wall hernia.  No abdominopelvic ascites. Musculoskeletal: The osseous structures are diffusely osteopenic. Degenerative changes are seen within the lumbar spine and hips bilaterally. The sacroiliac joints and pubic symphysis appear fused. No acute bone abnormality. No lytic or blastic bone lesion. IMPRESSION: 1. No acute intrathoracic or intra-abdominal injury. 2. Mild multi-vessel coronary artery calcification. 3. Mild sigmoid diverticulosis. 4. 14 mm indeterminate lesion within the posterior interpolar region of the right kidney. This may represent a complex cyst, however, this is not optimally characterized on this examination. If clinically  indicated, dedicated renal mass protocol CT or MRI examination is recommended for definitive evaluation. 5. Slight interval increase in size of a 5.0 cm infrarenal abdominal aortic aneurysm. Recommend follow-up CT/MR every 6 months and vascular consultation. This recommendation follows ACR consensus guidelines: White Paper of the ACR Incidental Findings Committee II on Vascular Findings. J Am Coll Radiol 2013; 10:789-794. 6. 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 Aortic Atherosclerosis (ICD10-I70.0). Aortic aneurysm NOS (ICD10-I71.9). Electronically Signed   By: Helyn Numbers M.D.   On: 10/10/2022 20:46   CT Head Wo Contrast  Result Date: 10/10/2022 CLINICAL DATA:  Neck trauma EXAM: CT HEAD WITHOUT CONTRAST CT CERVICAL SPINE WITHOUT CONTRAST TECHNIQUE: Multidetector CT imaging of the head and cervical spine was performed following the standard protocol without intravenous contrast. Multiplanar CT image reconstructions of the cervical spine were also generated. RADIATION DOSE REDUCTION: This exam was performed according to the departmental dose-optimization program which includes automated exposure control, adjustment of the mA and/or kV according to patient size and/or use of iterative reconstruction technique. COMPARISON:  CT C SPine 12/13/19 FINDINGS: CT HEAD FINDINGS Brain: No evidence of acute infarction, hemorrhage, hydrocephalus, extra-axial collection or mass lesion/mass effect. Sequela of moderate chronic microvascular ischemic change. Vascular: No hyperdense vessel or unexpected calcification. Skull: Normal. Negative for fracture or focal lesion. Sinuses/Orbits: No middle ear or mastoid effusion. Mucosal thickening right maxillary sinus and mild mucosal thickening in the bilateral ethmoid and left maxillary sinus. Bilateral lens replacement. Orbits are otherwise unremarkable. Other: None. CT CERVICAL SPINE FINDINGS Alignment:  Normal. Skull base and vertebrae: No acute fracture. There are osseous findings of DISH with multiple bridging anterior osteophytes. There is a lytic lesion the base of the dens extending to the C2 vertebral body (series 8, image 26), unchanged from 2021. There is also an additional lucent lesion in the right occipital bone (series 5, image 6). Soft tissues and spinal canal: No prevertebral fluid or swelling. No visible canal hematoma. Disc levels:  No evidence of high-grade spinal canal stenosis. Upper chest: See separately dictated CT chest abdomen and pelvis for additional findings. Other: None IMPRESSION: 1. No acute intracranial abnormality. 2. No acute cervical spine fracture. Electronically Signed   By: Lorenza Cambridge M.D.   On: 10/10/2022 20:37   CT Cervical Spine Wo Contrast  Result Date: 10/10/2022 CLINICAL DATA:  Neck trauma EXAM: CT HEAD WITHOUT CONTRAST CT CERVICAL SPINE WITHOUT CONTRAST TECHNIQUE: Multidetector CT imaging of the head and cervical spine was performed following the standard protocol without intravenous contrast. Multiplanar CT image reconstructions of the cervical spine were also generated. RADIATION DOSE  REDUCTION: This exam was performed according to the departmental dose-optimization program which includes automated exposure control, adjustment of the mA and/or kV according to patient size and/or use of iterative reconstruction technique. COMPARISON:  CT C SPine 12/13/19 FINDINGS: CT HEAD FINDINGS Brain: No evidence of acute infarction, hemorrhage, hydrocephalus, extra-axial collection or mass lesion/mass effect. Sequela of moderate chronic microvascular ischemic change. Vascular: No hyperdense vessel or unexpected calcification. Skull: Normal. Negative for fracture or focal lesion. Sinuses/Orbits: No middle ear or mastoid effusion. Mucosal thickening right maxillary sinus and mild mucosal thickening in the bilateral ethmoid and left maxillary sinus. Bilateral lens replacement.  Orbits are otherwise unremarkable. Other: None. CT CERVICAL SPINE FINDINGS Alignment: Normal. Skull base and vertebrae: No acute fracture. There are osseous findings of DISH with multiple bridging anterior osteophytes. There is a lytic lesion the base of the dens extending to the C2 vertebral body (series 8, image 26), unchanged from 2021. There is also an additional lucent lesion in the right occipital bone (series 5, image 6). Soft tissues and spinal canal: No prevertebral fluid or swelling. No visible canal hematoma. Disc levels:  No evidence of high-grade spinal canal stenosis. Upper chest: See separately dictated CT chest abdomen and pelvis for additional findings. Other: None IMPRESSION: 1. No acute intracranial abnormality. 2. No acute cervical spine fracture. Electronically Signed   By: Lorenza Cambridge M.D.   On: 10/10/2022 20:37   CT T-SPINE NO CHARGE  Result Date: 10/10/2022 CLINICAL DATA:  Trip and fall injury. EXAM: CT Thoracic and Lumbar spine with contrast TECHNIQUE: Multiplanar CT images of the thoracic and lumbar spine were reconstructed from contemporary CT of the Chest, Abdomen, and Pelvis. RADIATION DOSE REDUCTION: This exam was performed according to the departmental dose-optimization program which includes automated exposure control, adjustment of the mA and/or kV according to patient size and/or use of iterative reconstruction technique. CONTRAST:  No additional IV contrast material was used. COMPARISON:  CT chest 06/16/2022 FINDINGS: CT THORACIC SPINE FINDINGS Alignment: Thoracic kyphosis without anterior subluxation. Vertebrae: No vertebral compression deformities. No focal bone lesion or bone destruction. Bridging anterior osteophyte or ligamentous calcification consistent with ankylosis. Paraspinal and other soft tissues: Soft tissue swelling and infiltration in the posterior paraspinal muscles in the midthoracic region towards the left likely representing soft tissue hematoma. Disc  levels: Intervertebral disc spaces are preserved. CT LUMBAR SPINE FINDINGS Segmentation: 5 lumbar type vertebrae. Alignment: Normal alignment. Vertebrae: No vertebral compression deformities. No focal bone lesion or bone destruction. Bridging calcification anteriorly suggesting ankylosis. Ankylosis of the sacroiliac joints. Paraspinal and other soft tissues: Fatty atrophy of the posterior paraspinal muscles. No abnormal soft tissue swelling or infiltration. Disc levels: Intervertebral disc spaces are preserved. IMPRESSION: 1. Thoracic kyphosis. 2. 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 3. No acute displaced fractures are identified. Electronically Signed   By: Burman Nieves M.D.   On: 10/10/2022 20:35   CT L-SPINE NO CHARGE  Result Date: 10/10/2022 CLINICAL DATA:  Trip and fall injury. EXAM: CT Thoracic and Lumbar spine with contrast TECHNIQUE: Multiplanar CT images of the thoracic and lumbar spine were reconstructed from contemporary CT of the Chest, Abdomen, and Pelvis. RADIATION DOSE REDUCTION: This exam was performed according to the departmental dose-optimization program which includes automated exposure control, adjustment of the mA and/or kV according to patient size and/or use of iterative reconstruction technique. CONTRAST:  No additional IV contrast material was used. COMPARISON:  CT chest 06/16/2022 FINDINGS: CT THORACIC SPINE FINDINGS Alignment: Thoracic kyphosis without anterior subluxation.  Vertebrae: No vertebral compression deformities. No focal bone lesion or bone destruction. Bridging anterior osteophyte or ligamentous calcification consistent with ankylosis. Paraspinal and other soft tissues: Soft tissue swelling and infiltration in the posterior paraspinal muscles in the midthoracic region towards the left likely representing soft tissue hematoma. Disc levels: Intervertebral disc spaces are preserved. CT LUMBAR SPINE FINDINGS Segmentation: 5 lumbar type vertebrae. Alignment:  Normal alignment. Vertebrae: No vertebral compression deformities. No focal bone lesion or bone destruction. Bridging calcification anteriorly suggesting ankylosis. Ankylosis of the sacroiliac joints. Paraspinal and other soft tissues: Fatty atrophy of the posterior paraspinal muscles. No abnormal soft tissue swelling or infiltration. Disc levels: Intervertebral disc spaces are preserved. IMPRESSION: 1. Thoracic kyphosis. 2. 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 3. No acute displaced fractures are identified. Electronically Signed   By: Burman Nieves M.D.   On: 10/10/2022 20:35               LOS: 0 days   Abir Craine  [facility]   Pager on [email or web address]. If 7PM-7AM, please contact night-coverage at [email or web address]     10/12/2022, 9:38 AM

## 2022-10-12 NOTE — Progress Notes (Signed)
OT Cancellation Note  Patient Details Name: Aaron Gibbs MRN: [id number] DOB: Dec 26, 1934   Cancelled Treatment:    Reason Eval/Treat Not Completed: Fatigue/lethargy limiting ability to participate;Pain limiting ability to participate. Pt states he just got up to walk and now his back hurts. Pt's IV insertion site noted to be bleeding and occluded; RN notified. Pt left sidelying in bed; all needs in reach.   Alvester Morin 10/12/2022, 2:40 PM

## 2022-10-13 DIAGNOSIS — J441 Chronic obstructive pulmonary disease with (acute) exacerbation: Secondary | ICD-10-CM | POA: Diagnosis not present

## 2022-10-13 DIAGNOSIS — R531 Weakness: Secondary | ICD-10-CM | POA: Diagnosis not present

## 2022-10-13 DIAGNOSIS — D696 Thrombocytopenia, unspecified: Secondary | ICD-10-CM

## 2022-10-13 DIAGNOSIS — U071 COVID-19: Secondary | ICD-10-CM | POA: Diagnosis not present

## 2022-10-13 MED ORDER — BENZONATATE 100 MG PO CAPS
100.0000 mg | ORAL_CAPSULE | Freq: Three times a day (TID) | ORAL | Status: DC | PRN
  Administered 2022-10-13: 100 mg via ORAL
  Filled 2022-10-13: qty 1

## 2022-10-13 NOTE — Progress Notes (Signed)
Progress Note    Kelton Bultman  UXL:[id number] DOB: 07-Apr-1935  DOA: 10/10/2022 PCP: Smitty Cords, DO      Brief Narrative:    Medical records reviewed and are as summarized below:  Sem Mccaughey is a 88 y.o. male with medical history significant for hypertension, depression, chronic pain, COPD, hearing impairment, glucoma, legal blindness, anxiety, osteoporosis, chronic thrombocytopenia, depression, GERD, who presented to the hospital with general weakness and after a fall at home.  He also complained of cough, increasing sputum production, fatigue and wheezing.  He has about 30+ pack year smoking history.  He said he quit smoking about 15 years ago. He tested positive for COVID-19 infection in the emergency department.  He was admitted to the hospital for COPD exacerbation, COVID-19 infection, general weakness and mechanical fall.    Assessment/Plan:   Principal Problem:   COVID-19 Active Problems:   COPD (chronic obstructive pulmonary disease) (HCC)   Weakness   Essential hypertension   GERD (gastroesophageal reflux disease)   Major depressive disorder, recurrent episode, severe (HCC)   Fall at home, initial encounter   Thrombocytopenia (HCC)   COPD exacerbation (HCC)    COPD exacerbation: Continue IV doxycycline, IV dexamethasone and bronchodilators.   COVID-19 infection: Robitussin as needed for cough.  Add Tessalon as needed for cough.   General weakness, s/p fall: No acute fracture noted on CT cervical, thoracic and lumbar spine.  No acute abnormality found on CT chest abdomen and pelvis.  PT recommended home health therapy.   1.4 cm right renal lesion: Outpatient follow-up with PCP or urologist.   5 cm infrarenal abdominal aortic aneurysm: Follow-up with CT or MRI abdomen every 6 months recommended.  Outpatient follow-up with vascular surgeon.   Acute on chronic thrombocytopenia: Platelet count probably worsened by acute viral  infection.  Repeat CBC tomorrow    Other comorbidities include chronic pain on buprenorphine, legal blindness, hearing impairment, depression, anxiety, hypertension   Diet Order             Diet heart healthy/carb modified Room service appropriate? Yes; Fluid consistency: Thin  Diet effective now                            Consultants: None  Procedures: None    Medications:    bumetanide  0.5 mg Oral BID   buprenorphine  1 patch Transdermal Weekly   dexamethasone (DECADRON) injection  6 mg Intravenous Q24H   dorzolamide-timolol  1 drop Both Eyes BID   enoxaparin (LOVENOX) injection  40 mg Subcutaneous Q24H   ferrous sulfate  325 mg Oral Q supper   gabapentin  300 mg Oral Daily   latanoprost  1 drop Both Eyes QHS   LORazepam  0.5 mg Oral TID with meals   melatonin  5 mg Oral QHS   meloxicam  15 mg Oral Daily   pantoprazole  40 mg Oral Daily   tamsulosin  0.4 mg Oral Daily   Continuous Infusions:  doxycycline (VIBRAMYCIN) IV 100 mg (10/13/22 0014)     Anti-infectives (From admission, onward)    Start     Dose/Rate Route Frequency Ordered Stop   10/11/22 1200  doxycycline (VIBRAMYCIN) 100 mg in sodium chloride 0.9 % 250 mL IVPB        100 mg 125 mL/hr over 120 Minutes Intravenous Every 12 hours 10/11/22 1113  Family Communication/Anticipated D/C date and plan/Code Status   DVT prophylaxis: enoxaparin (LOVENOX) injection 40 mg Start: 10/11/22 2200 SCDs Start: 10/11/22 1103     Code Status: Full Code  Family Communication: None Disposition Plan: Plan to discharge home in 1 to 2 days   Status is: Inpatient Remains inpatient appropriate because: Uncontrolled back pain, COPD exacerbation         Subjective:   He complains of cough and low back pain.  No shortness of breath or chest pain.  However, he does not feel back to baseline.  Richrd Humbles, RN and nurse assistants were at the bedside.  Objective:    Vitals:    10/12/22 1611 10/12/22 2106 10/13/22 0459 10/13/22 0759  BP: (!) 135/110 (!) 152/65 (!) 179/87 (!) 149/96  Pulse: 82 84 89 97  Resp: (!) 22 18 18 16   Temp: 98.9 F (37.2 C) 98.9 F (37.2 C) 98.7 F (37.1 C) 98.7 F (37.1 C)  TempSrc: Oral Oral    SpO2: 94% 94% 94% 96%   No data found.   Intake/Output Summary (Last 24 hours) at 10/13/2022 1003 Last data filed at 10/13/2022 0600 Gross per 24 hour  Intake --  Output 500 ml  Net -500 ml   There were no vitals filed for this visit.  Exam:  GEN: NAD SKIN: No rash EYES: EOMI ENT: MMM CV: RRR PULM: Decreased air entry bilaterally, no wheezing or rales heard ABD: soft, obese, NT, +BS CNS: AAO x 3, non focal EXT: No edema or tenderness   Data Reviewed:   I have personally reviewed following labs and imaging studies:  Labs: Labs show the following:   Basic Metabolic Panel: Recent Labs  Lab 10/10/22 1919 10/12/22 0611  NA 141 142  K 4.0 3.6  CL 108 112*  CO2 26 21*  GLUCOSE 136* 136*  BUN 26* 26*  CREATININE 1.22 1.15  CALCIUM 8.9 8.7*   GFR CrCl cannot be calculated (Unknown ideal weight.). Liver Function Tests: Recent Labs  Lab 10/10/22 1919 10/12/22 0611  AST 25 28  ALT 12 18  ALKPHOS 57 47  BILITOT 0.7 0.6  PROT 6.6 6.4*  ALBUMIN 3.7 3.3*   Recent Labs  Lab 10/10/22 1919  LIPASE 20   No results for input(s): "AMMONIA" in the last 168 hours. Coagulation profile No results for input(s): "INR", "PROTIME" in the last 168 hours.  CBC: Recent Labs  Lab 10/10/22 1919 10/11/22 1201 10/11/22 2050 10/12/22 0611  WBC 5.0 3.7* 3.8* 3.9*  NEUTROABS 2.4 DUPLICATE REQUEST  1.5* 2.3  --   HGB 13.0 11.9* 12.5* 11.3*  HCT 41.2 37.4* 39.7 35.9*  MCV 92.6 92.8 91.1 92.5  PLT 102* 95* 110* 99*   Cardiac Enzymes: Recent Labs  Lab 10/10/22 1919  CKTOTAL 45*   BNP (last 3 results) No results for input(s): "PROBNP" in the last 8760 hours. CBG: No results for input(s): "GLUCAP" in the last 168  hours. D-Dimer: No results for input(s): "DDIMER" in the last 72 hours. Hgb A1c: No results for input(s): "HGBA1C" in the last 72 hours. Lipid Profile: No results for input(s): "CHOL", "HDL", "LDLCALC", "TRIG", "CHOLHDL", "LDLDIRECT" in the last 72 hours. Thyroid function studies: No results for input(s): "TSH", "T4TOTAL", "T3FREE", "THYROIDAB" in the last 72 hours.  Invalid input(s): "FREET3" Anemia work up: No results for input(s): "VITAMINB12", "FOLATE", "FERRITIN", "TIBC", "IRON", "RETICCTPCT" in the last 72 hours. Sepsis Labs: Recent Labs  Lab 10/10/22 1919 10/11/22 1201 10/11/22 2050 10/12/22 0611  WBC 5.0  3.7* 3.8* 3.9*    Microbiology Recent Results (from the past 240 hour(s))  SARS Coronavirus 2 by RT PCR (hospital order, performed in [facility] hospital lab) *cepheid single result test* Anterior Nasal Swab     Status: Abnormal   Collection Time: 10/11/22  8:30 AM   Specimen: Anterior Nasal Swab  Result Value Ref Range Status   SARS Coronavirus 2 by RT PCR POSITIVE (A) NEGATIVE Final    Comment: (NOTE) SARS-CoV-2 target nucleic acids are DETECTED  SARS-CoV-2 RNA is generally detectable in upper respiratory specimens  during the acute phase of infection.  Positive results are indicative  of the presence of the identified virus, but do not rule out bacterial infection or co-infection with other pathogens not detected by the test.  Clinical correlation with patient history and  other diagnostic information is necessary to determine patient infection status.  The expected result is negative.  Fact Sheet for Patients:   RoadLapTop.co.za   Fact Sheet for Healthcare Providers:   http://kim-miller.com/    This test is not yet approved or cleared by the [place] FDA and  has been authorized for detection and/or diagnosis of SARS-CoV-2 by FDA under an Emergency Use Authorization (EUA).  This EUA will remain in effect  (meaning this test can be used) for the duration of  the COVID-19 declaration under Section 564(b)(1)  of the Act, 21 U.S.C. section 360-bbb-3(b)(1), unless the authorization is terminated or revoked sooner.   Performed at [facility], [address] [place], [place] [address]     Procedures and diagnostic studies:  No results found.             LOS: 1 day   Nadelyn Enriques  [facility]   Pager on [email or web address]. If 7PM-7AM, please contact night-coverage at [email or web address]     10/13/2022, 10:03 AM

## 2022-10-14 DIAGNOSIS — J441 Chronic obstructive pulmonary disease with (acute) exacerbation: Secondary | ICD-10-CM | POA: Diagnosis not present

## 2022-10-14 DIAGNOSIS — U071 COVID-19: Secondary | ICD-10-CM | POA: Diagnosis not present

## 2022-10-14 LAB — CBC WITH DIFFERENTIAL/PLATELET
Abs Immature Granulocytes: 0.28 10*3/uL — ABNORMAL HIGH (ref 0.00–0.07)
Basophils Absolute: 0 10*3/uL (ref 0.0–0.1)
Basophils Relative: 0 %
Eosinophils Absolute: 0 10*3/uL (ref 0.0–0.5)
Eosinophils Relative: 0 %
HCT: 34.2 % — ABNORMAL LOW (ref 39.0–52.0)
Hemoglobin: 11.1 g/dL — ABNORMAL LOW (ref 13.0–17.0)
Immature Granulocytes: 4 %
Lymphocytes Relative: 25 %
Lymphs Abs: 1.7 10*3/uL (ref 0.7–4.0)
MCH: 29.2 pg (ref 26.0–34.0)
MCHC: 32.5 g/dL (ref 30.0–36.0)
MCV: 90 fL (ref 80.0–100.0)
Monocytes Absolute: 1.5 10*3/uL — ABNORMAL HIGH (ref 0.1–1.0)
Monocytes Relative: 23 %
Neutro Abs: 3.2 10*3/uL (ref 1.7–7.7)
Neutrophils Relative %: 48 %
Platelets: 144 10*3/uL — ABNORMAL LOW (ref 150–400)
RBC: 3.8 MIL/uL — ABNORMAL LOW (ref 4.22–5.81)
RDW: 15.6 % — ABNORMAL HIGH (ref 11.5–15.5)
Smear Review: NORMAL
WBC: 6.7 10*3/uL (ref 4.0–10.5)
nRBC: 0 % (ref 0.0–0.2)

## 2022-10-14 LAB — COMPREHENSIVE METABOLIC PANEL
ALT: 31 U/L (ref 0–44)
AST: 33 U/L (ref 15–41)
Albumin: 3.6 g/dL (ref 3.5–5.0)
Alkaline Phosphatase: 51 U/L (ref 38–126)
Anion gap: 9 (ref 5–15)
BUN: 30 mg/dL — ABNORMAL HIGH (ref 8–23)
CO2: 24 mmol/L (ref 22–32)
Calcium: 9.1 mg/dL (ref 8.9–10.3)
Chloride: 111 mmol/L (ref 98–111)
Creatinine, Ser: 1.16 mg/dL (ref 0.61–1.24)
GFR, Estimated: >60 mL/min (ref >60)
Glucose, Bld: 113 mg/dL — ABNORMAL HIGH (ref 70–99)
Potassium: 3.8 mmol/L (ref 3.5–5.1)
Sodium: 144 mmol/L (ref 135–145)
Total Bilirubin: 1 mg/dL (ref 0.3–1.2)
Total Protein: 7 g/dL (ref 6.5–8.1)

## 2022-10-14 MED ORDER — ENSURE ENLIVE PO LIQD
237.0000 mL | Freq: Three times a day (TID) | ORAL | Status: DC
  Administered 2022-10-14 – 2022-10-16 (×6): 237 mL via ORAL

## 2022-10-14 MED ORDER — DOXYCYCLINE HYCLATE 100 MG PO TABS
100.0000 mg | ORAL_TABLET | Freq: Two times a day (BID) | ORAL | Status: AC
  Administered 2022-10-14 – 2022-10-15 (×4): 100 mg via ORAL
  Filled 2022-10-14 (×4): qty 1

## 2022-10-14 MED ORDER — PREDNISONE 20 MG PO TABS
40.0000 mg | ORAL_TABLET | Freq: Every day | ORAL | Status: AC
  Administered 2022-10-14 – 2022-10-15 (×2): 40 mg via ORAL
  Filled 2022-10-14 (×2): qty 2

## 2022-10-14 NOTE — Progress Notes (Signed)
Patient pull PIV with catheter intact.  Dr Myriam Forehand notified with advisory not to replace PIV.

## 2022-10-14 NOTE — Progress Notes (Addendum)
Progress Note    Aaron Gibbs  ZOX:[id number] DOB: January 06, 1935  DOA: 10/10/2022 PCP: Smitty Cords, DO      Brief Narrative:    Medical records reviewed and are as summarized below:  Aaron Gibbs is a 88 y.o. male with medical history significant for hypertension, depression, chronic pain, COPD, hearing impairment, glucoma, legal blindness, anxiety, osteoporosis, chronic thrombocytopenia, depression, GERD, who presented to the hospital with general weakness and after a fall at home.  He also complained of cough, increasing sputum production, fatigue and wheezing.  He has about 30+ pack year smoking history.  He said he quit smoking about 15 years ago. He tested positive for COVID-19 infection in the emergency department.  He was admitted to the hospital for COPD exacerbation, COVID-19 infection, general weakness and mechanical fall.    Assessment/Plan:   Principal Problem:   COVID-19 Active Problems:   COPD (chronic obstructive pulmonary disease) (HCC)   Weakness   Essential hypertension   GERD (gastroesophageal reflux disease)   Major depressive disorder, recurrent episode, severe (HCC)   Fall at home, initial encounter   Thrombocytopenia (HCC)   COPD exacerbation (HCC)    COPD exacerbation: Change IV to oral doxycycline, change IV dexamethasone to oral prednisone.  Continue bronchodilators.   COVID-19 infection: Antitussives as needed for cough.   Poor oral intake likely from COVID-19 infection.  Ensure supplement has been ordered   General weakness, s/p fall: No acute fracture noted on CT cervical, thoracic and lumbar spine.  No acute abnormality found on CT chest abdomen and pelvis.  PT recommended home health therapy.   1.4 cm right renal lesion: Outpatient follow-up with PCP or urologist.  Discussed with patient about the importance of follow-up   5 cm infrarenal abdominal aortic aneurysm: Follow-up with CT or MRI abdomen every 6 months  recommended.  Outpatient follow-up with vascular surgeon.  Discussed with patient about importance of follow-up.   Acute on chronic thrombocytopenia: Platelet count is improving.      Other comorbidities include chronic pain on buprenorphine, legal blindness, hearing impairment, depression, anxiety, hypertension   He said he doesn't feel well enough to go home today    Diet Order             Diet heart healthy/carb modified Room service appropriate? Yes; Fluid consistency: Thin  Diet effective now                            Consultants: None  Procedures: None    Medications:    bumetanide  0.5 mg Oral BID   buprenorphine  1 patch Transdermal Weekly   dorzolamide-timolol  1 drop Both Eyes BID   doxycycline  100 mg Oral Q12H   enoxaparin (LOVENOX) injection  40 mg Subcutaneous Q24H   feeding supplement  237 mL Oral TID BM   ferrous sulfate  325 mg Oral Q supper   gabapentin  300 mg Oral Daily   latanoprost  1 drop Both Eyes QHS   LORazepam  0.5 mg Oral TID with meals   melatonin  5 mg Oral QHS   meloxicam  15 mg Oral Daily   pantoprazole  40 mg Oral Daily   predniSONE  40 mg Oral Q breakfast   tamsulosin  0.4 mg Oral Daily   Continuous Infusions:     Anti-infectives (From admission, onward)    Start     Dose/Rate Route Frequency Ordered Stop  10/14/22 1100  doxycycline (VIBRA-TABS) tablet 100 mg        100 mg Oral Every 12 hours 10/14/22 0812 10/16/22 0959   10/11/22 1200  doxycycline (VIBRAMYCIN) 100 mg in sodium chloride 0.9 % 250 mL IVPB  Status:  Discontinued        100 mg 125 mL/hr over 120 Minutes Intravenous Every 12 hours 10/11/22 1113 10/14/22 8119              Family Communication/Anticipated D/C date and plan/Code Status   DVT prophylaxis: enoxaparin (LOVENOX) injection 40 mg Start: 10/11/22 2200 SCDs Start: 10/11/22 1103     Code Status: DNR.  Patient has a DNR form that was filled on 02/22/2022.  Discussed CODE STATUS  with the patient and he confirmed that he is DNR.  CODE STATUS has been changed from full code to DNR.  Brooke, Charity fundraiser, was at the bedside during this discussion on 10/14/2022 at 1:06 pm.  Family Communication: None Disposition Plan: Plan to discharge home tomorrow   Status is: Inpatient Remains inpatient appropriate because: Uncontrolled back pain, COPD exacerbation         Subjective:   He complains of poor appetite, general weakness and low back pain.   Objective:    Vitals:   10/13/22 0759 10/13/22 1607 10/13/22 2004 10/14/22 0835  BP: (!) 149/96 133/82 (!) 148/90 (!) 148/108  Pulse: 97 94 74 (!) 106  Resp: 16 16 20 18   Temp: 98.7 F (37.1 C) 97.6 F (36.4 C) 97.8 F (36.6 C) 97.6 F (36.4 C)  TempSrc:    Oral  SpO2: 96% 96% 94% 94%   No data found.   Intake/Output Summary (Last 24 hours) at 10/14/2022 1354 Last data filed at 10/14/2022 1215 Gross per 24 hour  Intake --  Output 325 ml  Net -325 ml   There were no vitals filed for this visit.  Exam:  GEN: NAD SKIN: No rash EYES: EOMI ENT: MMM CV: RRR PULM: Air entry adequate bilaterally, no wheezing or rales. ABD: soft, obese, NT, +BS CNS: AAO x 3, non focal EXT: No edema or tenderness    Data Reviewed:   I have personally reviewed following labs and imaging studies:  Labs: Labs show the following:   Basic Metabolic Panel: Recent Labs  Lab 10/10/22 1919 10/12/22 0611 10/14/22 0638  NA 141 142 144  K 4.0 3.6 3.8  CL 108 112* 111  CO2 26 21* 24  GLUCOSE 136* 136* 113*  BUN 26* 26* 30*  CREATININE 1.22 1.15 1.16  CALCIUM 8.9 8.7* 9.1   GFR CrCl cannot be calculated (Unknown ideal weight.). Liver Function Tests: Recent Labs  Lab 10/10/22 1919 10/12/22 0611 10/14/22 0638  AST 25 28 33  ALT 12 18 31   ALKPHOS 57 47 51  BILITOT 0.7 0.6 1.0  PROT 6.6 6.4* 7.0  ALBUMIN 3.7 3.3* 3.6   Recent Labs  Lab 10/10/22 1919  LIPASE 20   No results for input(s): "AMMONIA" in the last 168  hours. Coagulation profile No results for input(s): "INR", "PROTIME" in the last 168 hours.  CBC: Recent Labs  Lab 10/10/22 1919 10/11/22 1201 10/11/22 2050 10/12/22 0611 10/14/22 0638  WBC 5.0 3.7* 3.8* 3.9* 6.7  NEUTROABS 2.4 DUPLICATE REQUEST  1.5* 2.3  --  3.2  HGB 13.0 11.9* 12.5* 11.3* 11.1*  HCT 41.2 37.4* 39.7 35.9* 34.2*  MCV 92.6 92.8 91.1 92.5 90.0  PLT 102* 95* 110* 99* 144*   Cardiac Enzymes: Recent Labs  Lab 10/10/22 1919  CKTOTAL 45*   BNP (last 3 results) No results for input(s): "PROBNP" in the last 8760 hours. CBG: No results for input(s): "GLUCAP" in the last 168 hours. D-Dimer: No results for input(s): "DDIMER" in the last 72 hours. Hgb A1c: No results for input(s): "HGBA1C" in the last 72 hours. Lipid Profile: No results for input(s): "CHOL", "HDL", "LDLCALC", "TRIG", "CHOLHDL", "LDLDIRECT" in the last 72 hours. Thyroid function studies: No results for input(s): "TSH", "T4TOTAL", "T3FREE", "THYROIDAB" in the last 72 hours.  Invalid input(s): "FREET3" Anemia work up: No results for input(s): "VITAMINB12", "FOLATE", "FERRITIN", "TIBC", "IRON", "RETICCTPCT" in the last 72 hours. Sepsis Labs: Recent Labs  Lab 10/11/22 1201 10/11/22 2050 10/12/22 0611 10/14/22 0638  WBC 3.7* 3.8* 3.9* 6.7    Microbiology Recent Results (from the past 240 hour(s))  SARS Coronavirus 2 by RT PCR (hospital order, performed in [facility] hospital lab) *cepheid single result test* Anterior Nasal Swab     Status: Abnormal   Collection Time: 10/11/22  8:30 AM   Specimen: Anterior Nasal Swab  Result Value Ref Range Status   SARS Coronavirus 2 by RT PCR POSITIVE (A) NEGATIVE Final    Comment: (NOTE) SARS-CoV-2 target nucleic acids are DETECTED  SARS-CoV-2 RNA is generally detectable in upper respiratory specimens  during the acute phase of infection.  Positive results are indicative  of the presence of the identified virus, but do not rule out bacterial  infection or co-infection with other pathogens not detected by the test.  Clinical correlation with patient history and  other diagnostic information is necessary to determine patient infection status.  The expected result is negative.  Fact Sheet for Patients:   RoadLapTop.co.za   Fact Sheet for Healthcare Providers:   http://kim-miller.com/    This test is not yet approved or cleared by the [place] FDA and  has been authorized for detection and/or diagnosis of SARS-CoV-2 by FDA under an Emergency Use Authorization (EUA).  This EUA will remain in effect (meaning this test can be used) for the duration of  the COVID-19 declaration under Section 564(b)(1)  of the Act, 21 U.S.C. section 360-bbb-3(b)(1), unless the authorization is terminated or revoked sooner.   Performed at [facility], [address] [place], [place] [address]     Procedures and diagnostic studies:  No results found.             LOS: 2 days   Myan Suit  [facility]   Pager on [email or web address]. If 7PM-7AM, please contact night-coverage at [email or web address]     10/14/2022, 1:54 PM

## 2022-10-15 ENCOUNTER — Inpatient Hospital Stay: Payer: Medicare Other

## 2022-10-15 DIAGNOSIS — J441 Chronic obstructive pulmonary disease with (acute) exacerbation: Secondary | ICD-10-CM | POA: Diagnosis not present

## 2022-10-15 DIAGNOSIS — U071 COVID-19: Secondary | ICD-10-CM | POA: Diagnosis not present

## 2022-10-15 DIAGNOSIS — R531 Weakness: Secondary | ICD-10-CM | POA: Diagnosis not present

## 2022-10-15 NOTE — Progress Notes (Signed)
       CROSS COVER NOTE  NAME: Grafton Warzecha MRN: [id number] DOB : 1935-03-06    HPI/Events of Note   Report:fall without apparent injury  On review of chart:    Assessment and  Interventions   Assessment: Multiple risk factors including legally blind and hard of hearing Plan: Sitter ordered     Donnie Mesa NP [facility]

## 2022-10-15 NOTE — Progress Notes (Signed)
Progress Note    Aaron Gibbs  ZOX:[id number] DOB: Jun 14, 1935  DOA: 10/10/2022 PCP: Smitty Cords, DO      Brief Narrative:    Medical records reviewed and are as summarized below:  Aaron Gibbs is a 88 y.o. male with medical history significant for hypertension, depression, chronic pain, COPD, hearing impairment, glucoma, legal blindness, anxiety, osteoporosis, chronic thrombocytopenia, depression, GERD, who presented to the hospital with general weakness and after a fall at home.  He also complained of cough, increasing sputum production, fatigue and wheezing.  He has about 30+ pack year smoking history.  He said he quit smoking about 15 years ago. He tested positive for COVID-19 infection in the emergency department.  He was admitted to the hospital for COPD exacerbation, COVID-19 infection, general weakness and mechanical fall.    Assessment/Plan:   Principal Problem:   COVID-19 Active Problems:   COPD (chronic obstructive pulmonary disease) (HCC)   Weakness   Essential hypertension   GERD (gastroesophageal reflux disease)   Major depressive disorder, recurrent episode, severe (HCC)   Fall at home, initial encounter   Thrombocytopenia (HCC)   COPD exacerbation (HCC)    COPD exacerbation: Continue bronchodilators, doxycycline and prednisone   COVID-19 infection: Antitussives as needed for cough.   Poor oral intake likely from COVID-19 infection.  Continue ensure supplement    General weakness, severe low back pain, s/p fall: No acute fracture noted on CT cervical, thoracic and lumbar spine.  No acute abnormality found on CT chest abdomen and pelvis.  MRI thoracic and lumbar spine have been ordered for further evaluation. PT recommended home health therapy.   1.4 cm right renal lesion: Outpatient follow-up with PCP or urologist.  Discussed with patient about the importance of follow-up   5 cm infrarenal abdominal aortic aneurysm:  Follow-up with CT or MRI abdomen every 6 months recommended.  Outpatient follow-up with vascular surgeon.  Discussed with patient about importance of follow-up.   Acute on chronic thrombocytopenia: Platelet count is better.    Other comorbidities include chronic pain on buprenorphine, legal blindness, hearing impairment, depression, anxiety, hypertension       Diet Order             Diet heart healthy/carb modified Room service appropriate? Yes; Fluid consistency: Thin  Diet effective now                            Consultants: None  Procedures: None    Medications:    bumetanide  0.5 mg Oral BID   buprenorphine  1 patch Transdermal Weekly   dorzolamide-timolol  1 drop Both Eyes BID   doxycycline  100 mg Oral Q12H   enoxaparin (LOVENOX) injection  40 mg Subcutaneous Q24H   feeding supplement  237 mL Oral TID BM   ferrous sulfate  325 mg Oral Q supper   gabapentin  300 mg Oral Daily   latanoprost  1 drop Both Eyes QHS   LORazepam  0.5 mg Oral TID with meals   melatonin  5 mg Oral QHS   meloxicam  15 mg Oral Daily   pantoprazole  40 mg Oral Daily   predniSONE  40 mg Oral Q breakfast   tamsulosin  0.4 mg Oral Daily   Continuous Infusions:     Anti-infectives (From admission, onward)    Start     Dose/Rate Route Frequency Ordered Stop   10/14/22 1100  doxycycline (VIBRA-TABS) tablet  100 mg        100 mg Oral Every 12 hours 10/14/22 0812 10/16/22 0959   10/11/22 1200  doxycycline (VIBRAMYCIN) 100 mg in sodium chloride 0.9 % 250 mL IVPB  Status:  Discontinued        100 mg 125 mL/hr over 120 Minutes Intravenous Every 12 hours 10/11/22 1113 10/14/22 1096              Family Communication/Anticipated D/C date and plan/Code Status   DVT prophylaxis: enoxaparin (LOVENOX) injection 40 mg Start: 10/11/22 2200 SCDs Start: 10/11/22 1103     Code Status: DNR.    Family Communication: None Disposition Plan: Plan to discharge home  tomorrow   Status is: Inpatient Remains inpatient appropriate because: Uncontrolled back pain, COPD exacerbation         Subjective:   Interval events noted.  He said he "slid out of the bed last night".  No complaints of general weakness, feeling sick and severe back pain.  He has a Comptroller at the bedside.  Objective:    Vitals:   10/14/22 2320 10/15/22 0242 10/15/22 0258 10/15/22 0508  BP: (!) 143/77  (!) 194/94 (!) 144/90  Pulse: 88  (!) 125 95  Resp: (!) 24  (!) 24 20  Temp: 99 F (37.2 C)  (!) 97.5 F (36.4 C) 98.1 F (36.7 C)  TempSrc: Oral  Oral   SpO2: 95%  99% 94%  Height:  5\' 10"  (1.778 m)     No data found.   Intake/Output Summary (Last 24 hours) at 10/15/2022 0910 Last data filed at 10/14/2022 1845 Gross per 24 hour  Intake --  Output 300 ml  Net -300 ml   There were no vitals filed for this visit.  Exam:  GEN: NAD SKIN: Warm and dry EYES: No pallor or icterus ENT: MMM CV: RRR PULM: Lungs clear to great bilaterally, bilateral rhonchi ABD: soft, obese, NT, +BS CNS: AAO x 3, non focal EXT: No edema or tenderness MSK: Left lower thoracic and upper lumbar paraspinal tenderness    Data Reviewed:   I have personally reviewed following labs and imaging studies:  Labs: Labs show the following:   Basic Metabolic Panel: Recent Labs  Lab 10/10/22 1919 10/12/22 0611 10/14/22 0638  NA 141 142 144  K 4.0 3.6 3.8  CL 108 112* 111  CO2 26 21* 24  GLUCOSE 136* 136* 113*  BUN 26* 26* 30*  CREATININE 1.22 1.15 1.16  CALCIUM 8.9 8.7* 9.1   GFR CrCl cannot be calculated (Unknown ideal weight.). Liver Function Tests: Recent Labs  Lab 10/10/22 1919 10/12/22 0611 10/14/22 0638  AST 25 28 33  ALT 12 18 31   ALKPHOS 57 47 51  BILITOT 0.7 0.6 1.0  PROT 6.6 6.4* 7.0  ALBUMIN 3.7 3.3* 3.6   Recent Labs  Lab 10/10/22 1919  LIPASE 20   No results for input(s): "AMMONIA" in the last 168 hours. Coagulation profile No results for input(s):  "INR", "PROTIME" in the last 168 hours.  CBC: Recent Labs  Lab 10/10/22 1919 10/11/22 1201 10/11/22 2050 10/12/22 0611 10/14/22 0638  WBC 5.0 3.7* 3.8* 3.9* 6.7  NEUTROABS 2.4 DUPLICATE REQUEST  1.5* 2.3  --  3.2  HGB 13.0 11.9* 12.5* 11.3* 11.1*  HCT 41.2 37.4* 39.7 35.9* 34.2*  MCV 92.6 92.8 91.1 92.5 90.0  PLT 102* 95* 110* 99* 144*   Cardiac Enzymes: Recent Labs  Lab 10/10/22 1919  CKTOTAL 45*   BNP (last 3  results) No results for input(s): "PROBNP" in the last 8760 hours. CBG: No results for input(s): "GLUCAP" in the last 168 hours. D-Dimer: No results for input(s): "DDIMER" in the last 72 hours. Hgb A1c: No results for input(s): "HGBA1C" in the last 72 hours. Lipid Profile: No results for input(s): "CHOL", "HDL", "LDLCALC", "TRIG", "CHOLHDL", "LDLDIRECT" in the last 72 hours. Thyroid function studies: No results for input(s): "TSH", "T4TOTAL", "T3FREE", "THYROIDAB" in the last 72 hours.  Invalid input(s): "FREET3" Anemia work up: No results for input(s): "VITAMINB12", "FOLATE", "FERRITIN", "TIBC", "IRON", "RETICCTPCT" in the last 72 hours. Sepsis Labs: Recent Labs  Lab 10/11/22 1201 10/11/22 2050 10/12/22 0611 10/14/22 0638  WBC 3.7* 3.8* 3.9* 6.7    Microbiology Recent Results (from the past 240 hour(s))  SARS Coronavirus 2 by RT PCR (hospital order, performed in [facility] hospital lab) *cepheid single result test* Anterior Nasal Swab     Status: Abnormal   Collection Time: 10/11/22  8:30 AM   Specimen: Anterior Nasal Swab  Result Value Ref Range Status   SARS Coronavirus 2 by RT PCR POSITIVE (A) NEGATIVE Final    Comment: (NOTE) SARS-CoV-2 target nucleic acids are DETECTED  SARS-CoV-2 RNA is generally detectable in upper respiratory specimens  during the acute phase of infection.  Positive results are indicative  of the presence of the identified virus, but do not rule out bacterial infection or co-infection with other pathogens not detected  by the test.  Clinical correlation with patient history and  other diagnostic information is necessary to determine patient infection status.  The expected result is negative.  Fact Sheet for Patients:   RoadLapTop.co.za   Fact Sheet for Healthcare Providers:   http://kim-miller.com/    This test is not yet approved or cleared by the [place] FDA and  has been authorized for detection and/or diagnosis of SARS-CoV-2 by FDA under an Emergency Use Authorization (EUA).  This EUA will remain in effect (meaning this test can be used) for the duration of  the COVID-19 declaration under Section 564(b)(1)  of the Act, 21 U.S.C. section 360-bbb-3(b)(1), unless the authorization is terminated or revoked sooner.   Performed at [facility], [address] [place], [place] [address]     Procedures and diagnostic studies:  No results found.             LOS: 3 days   Marnesha Gagen  [facility]   Pager on [email or web address]. If 7PM-7AM, please contact night-coverage at [email or web address]     10/15/2022, 9:10 AM

## 2022-10-15 NOTE — Progress Notes (Signed)
At approximately 0300 patient was found on  the floor.  Patient assisted to standing with help. No noticeable new wounds or new bruises.  Patient 's daughter  [name] was notified of fall.

## 2022-10-15 NOTE — TOC Progression Note (Signed)
Transition of Care [facility]) - Progression Note    Patient Details  Name: Aaron Gibbs MRN: [id number] Date of Birth: [birth year]  Transition of [facility] CM/SW Contact  [address], [name], [place] Phone Number: 10/15/2022, 4:23 PM  Clinical Narrative:     Daughter requesting EMS transport home for safety. Confirms that they do have a wheelchair ramp. Patient has an aid that will be at the home when patient arrives. Confirmed that patient will be followed by [facility].  Adison Jerger, LCSW Transition of Care         Expected Discharge Plan and Services                                               Social Determinants of Health (SDOH) Interventions SDOH Screenings   Food Insecurity: No Food Insecurity (10/11/2022)  Housing: Low Risk  (10/11/2022)  Transportation Needs: No Transportation Needs (10/11/2022)  Utilities: Not At Risk (10/11/2022)  Alcohol Screen: Low Risk  (07/13/2022)  Depression (PHQ2-9): Low Risk  (09/28/2022)  Recent Concern: Depression (PHQ2-9) - High Risk (09/13/2022)  Financial Resource Strain: Low Risk  (07/13/2022)  Physical Activity: Inactive (07/13/2022)  Social Connections: Socially Isolated (07/13/2022)  Stress: Stress Concern Present (07/13/2022)  Tobacco Use: High Risk (10/12/2022)    Readmission Risk Interventions     No data to display

## 2022-10-16 ENCOUNTER — Inpatient Hospital Stay: Payer: Medicare Other

## 2022-10-16 DIAGNOSIS — W19XXXA Unspecified fall, initial encounter: Secondary | ICD-10-CM | POA: Diagnosis not present

## 2022-10-16 DIAGNOSIS — U071 COVID-19: Secondary | ICD-10-CM | POA: Diagnosis not present

## 2022-10-16 DIAGNOSIS — Y92009 Unspecified place in unspecified non-institutional (private) residence as the place of occurrence of the external cause: Secondary | ICD-10-CM | POA: Diagnosis not present

## 2022-10-16 DIAGNOSIS — J441 Chronic obstructive pulmonary disease with (acute) exacerbation: Secondary | ICD-10-CM | POA: Diagnosis not present

## 2022-10-16 LAB — GLUCOSE, CAPILLARY: Glucose-Capillary: 151 mg/dL — ABNORMAL HIGH (ref 70–99)

## 2022-10-16 MED ORDER — MORPHINE SULFATE (PF) 2 MG/ML IV SOLN: 2.0000 mg | INTRAVENOUS | Status: DC | PRN

## 2022-10-16 MED ORDER — HYDROMORPHONE HCL 1 MG/ML IJ SOLN
0.5000 mg | INTRAMUSCULAR | Status: DC | PRN
  Administered 2022-10-16: 0.5 mg via INTRAVENOUS
  Filled 2022-10-16: qty 1

## 2022-10-16 NOTE — Progress Notes (Addendum)
PT Cancellation Note  Patient Details Name: Aaron Gibbs MRN: [id number] DOB: 1934/10/20   Cancelled Treatment:    Reason Eval/Treat Not Completed: Other (comment)  Fall noted yesterday am per chart.  Unable to tolerate MRI.  Pt with sitter and per nursing has been given meds this am and not able to fully participate at this time.  Will defer to a later time/date.  Will adjust discharge recommendations as appropriate next session.   Danielle Dess 10/16/2022, 1:33 PM

## 2022-10-16 NOTE — TOC Transition Note (Signed)
Transition of Care [facility]) - CM/SW Discharge Note   Patient Details  Name: Aaron Gibbs MRN: [id number] Date of Birth: Nov 16, 1934  [facility] CM/SW Contact:  Allena Katz, LCSW Phone Number: 10/16/2022, 2:16 PM   Clinical Narrative:    Pt discharging home with [facility] PT/OT. Barbara Cower with [facility] notified. [facility] arranged pt third on list. RN to notify family. CSW signing off.           Patient Goals and CMS Choice      Discharge Placement                         Discharge Plan and Services Additional resources added to the After Visit Summary for                                       Social Determinants of Health (SDOH) Interventions SDOH Screenings   Food Insecurity: No Food Insecurity (10/11/2022)  Housing: Low Risk  (10/11/2022)  Transportation Needs: No Transportation Needs (10/11/2022)  Utilities: Not At Risk (10/11/2022)  Alcohol Screen: Low Risk  (07/13/2022)  Depression (PHQ2-9): Low Risk  (09/28/2022)  Recent Concern: Depression (PHQ2-9) - High Risk (09/13/2022)  Financial Resource Strain: Low Risk  (07/13/2022)  Physical Activity: Inactive (07/13/2022)  Social Connections: Socially Isolated (07/13/2022)  Stress: Stress Concern Present (07/13/2022)  Tobacco Use: High Risk (10/12/2022)     Readmission Risk Interventions     No data to display

## 2022-10-16 NOTE — Discharge Summary (Signed)
Physician Discharge Summary   Patient: Aaron Gibbs MRN: [id number] DOB: 26-Jul-1934  Admit date:     10/10/2022  Discharge date: 10/16/22  Discharge Physician: Lurene Shadow   PCP: Smitty Cords, DO   Recommendations at discharge:   Follow-up with PCP in 1 week  Discharge Diagnoses: Principal Problem:   COVID-19 Active Problems:   COPD (chronic obstructive pulmonary disease) (HCC)   Weakness   Essential hypertension   GERD (gastroesophageal reflux disease)   Major depressive disorder, recurrent episode, severe (HCC)   Fall at home, initial encounter   Thrombocytopenia (HCC)   COPD exacerbation (HCC)  Resolved Problems:   * No resolved hospital problems. [facility] Course:  [name] is a 88 y.o. male with medical history significant for hypertension, depression, chronic pain, COPD, hearing impairment, glucoma, legal blindness, anxiety, osteoporosis, chronic thrombocytopenia, depression, GERD, who presented to the hospital with general weakness and after a fall at home.  He also complained of cough, increasing sputum production, fatigue and wheezing.  He has about 30+ pack year smoking history.  He said he quit smoking about 15 years ago. He tested positive for COVID-19 infection in the emergency department.   He was admitted to the hospital for COPD exacerbation, COVID-19 infection, general weakness and mechanical fall.     Assessment and Plan:   Probable COPD exacerbation:  He completed 5 days of doxycycline and steroids.     COVID-19 infection: Antitussives as needed for cough.     Poor oral intake likely from COVID-19 infection.  Encourage adequate oral intake.  He can continue Ensure supplements as needed.     General weakness, severe low back pain, acute on chronic back pain, s/p fall: No acute fracture noted on CT cervical, thoracic and lumbar spine.  No acute abnormality found on CT chest abdomen and pelvis.  MRI thoracic and lumbar  spine could not be completed because patient cannot lay down flat. PT recommended home health therapy.     1.4 cm right renal lesion: Outpatient follow-up with PCP or urologist.  Discussed with patient about the importance of follow-up.  This was also discussed with Aaron Gibbs, daughter.     5 cm infrarenal abdominal aortic aneurysm: Follow-up with CT or MRI abdomen every 6 months recommended.  Outpatient follow-up with vascular surgeon.  Discussed with patient about importance of follow-up.  Discussed with Aaron Gibbs, daughter.     Acute on chronic thrombocytopenia: Platelet count is better.     Other comorbidities include chronic pain on buprenorphine, legal blindness, hearing impairment, depression, anxiety, hypertension   Overall, his condition has improved.  He still has some back pain.  Aaron Gibbs, daughter, said they can manage his pain at home.  She also acknowledged that patient cannot do the MRI because he cannot lay down flat.  Patient is deemed medically stable for discharge today.  Discharge plan was discussed with Aaron Gibbs over the phone.   Consultants: None Procedures performed: None  Disposition: Home health Diet recommendation:  Discharge Diet Orders (From admission, onward)     Start     Ordered   10/16/22 0000  Diet - low sodium heart healthy        10/16/22 1346           Cardiac diet DISCHARGE MEDICATION: Allergies as of 10/16/2022       Reactions   Anthralin Shortness Of Breath   Azithromycin Shortness Of Breath        Medication List  STOP taking these medications    clotrimazole-betamethasone cream Commonly known as: LOTRISONE   mupirocin ointment 2 % Commonly known as: BACTROBAN   nystatin powder Commonly known as: MYCOSTATIN/NYSTOP       TAKE these medications    acetaminophen 500 MG tablet Commonly known as: TYLENOL Take 1,000 mg by mouth in the morning, at noon, and at bedtime.   bumetanide 1 MG tablet Commonly known as: BUMEX Take 1/2  (one-half) tablet by mouth twice daily   buprenorphine 10 MCG/HR Ptwk Commonly known as: BUTRANS Place 1 patch onto the skin once a week for 28 days. Apply only 1 patch at a time and alternate sites weekly.   buprenorphine 10 MCG/HR Ptwk Commonly known as: BUTRANS Place 1 patch onto the skin once a week for 28 days. Apply only 1 patch at a time and alternate sites weekly. Start taking on: Nov 01, 2022   buprenorphine 10 MCG/HR Ptwk Commonly known as: BUTRANS Place 1 patch onto the skin once a week for 28 days. Apply only 1 patch at a time and alternate sites weekly. Start taking on: November 29, 2022   carvedilol 12.5 MG tablet Commonly known as: COREG Take 1 tablet by mouth twice daily   Cosopt 2-0.5 % ophthalmic solution Generic drug: dorzolamide-timolol Place 1 drop into both eyes 2 (two) times daily.   diclofenac Sodium 1 % Gel Commonly known as: VOLTAREN Apply 2 g topically at bedtime. Apply to both knees twice daily   DULoxetine 30 MG capsule Commonly known as: CYMBALTA Take 3 capsules (90 mg total) by mouth daily.   finasteride 5 MG tablet Commonly known as: Proscar Take 1 tablet (5 mg total) by mouth daily.   gabapentin 300 MG capsule Commonly known as: NEURONTIN Take 1 capsule (300 mg total) by mouth daily.   Iron (Ferrous Sulfate) 325 (65 Fe) MG Tabs Take 325 mg by mouth daily with supper.   latanoprost 0.005 % ophthalmic solution Commonly known as: XALATAN Place 1 drop into both eyes at bedtime.   LORazepam 0.5 MG tablet Commonly known as: ATIVAN Take 1 tablet (0.5 mg total) by mouth in the morning, at noon, and at bedtime.   Melatonin 5 MG Caps Take 1 capsule (5 mg total) by mouth at bedtime.   meloxicam 15 MG tablet Commonly known as: MOBIC Take 15 mg by mouth daily.   OLANZapine 2.5 MG tablet Commonly known as: ZYPREXA Take 1 tablet by mouth twice daily   pantoprazole 40 MG tablet Commonly known as: PROTONIX Take 1 tablet (40 mg total) by  mouth daily.   SYSTANE BALANCE OP Place 1 drop into the left eye daily.   tamsulosin 0.4 MG Caps capsule Commonly known as: FLOMAX Take 1 capsule by mouth once daily   traZODone 150 MG tablet Commonly known as: DESYREL TAKE 1 TABLET BY MOUTH AT BEDTIME AS NEEDED        Follow-up Information     [facility]. Schedule an appointment as soon as possible for a visit in 2 week(s).   Why: 5 cm abdominal aortic aneurysm Contact information: [address] [place] [address] [phone]               Discharge Exam:  GEN: NAD SKIN: Warm and dry EYES: EOMI ENT: MMM CV: RRR PULM: CTA B ABD: soft, obese, NT, +BS CNS: AAO x 3, non focal EXT: No edema or tenderness MSK: Kyphosis with a big help on  his back, left upper lumbar/lower thoracic paraspinal tenderness    Condition at discharge: good  The results of significant diagnostics from this hospitalization (including imaging, microbiology, ancillary and laboratory) are listed below for reference.   Imaging Studies: CT CHEST ABDOMEN PELVIS W CONTRAST  Result Date: 10/10/2022 CLINICAL DATA:  Polytrauma, blunt, fall, left flank pain EXAM: CT CHEST, ABDOMEN, AND PELVIS WITH CONTRAST TECHNIQUE: Multidetector CT imaging of the chest, abdomen and pelvis was performed following the standard protocol during bolus administration of intravenous contrast. RADIATION DOSE REDUCTION: This exam was performed according to the departmental dose-optimization program which includes automated exposure control, adjustment of the mA and/or kV according to patient size and/or use of iterative reconstruction technique. CONTRAST:  80mL OMNIPAQUE IOHEXOL 350 MG/ML SOLN COMPARISON:  06/16/2022 FINDINGS: CT CHEST FINDINGS Cardiovascular: Mild multi-vessel coronary artery calcification. Global cardiac size within normal limits. No pericardial effusion. Central pulmonary arteries are of normal  caliber. Mild atherosclerotic calcification within the thoracic aorta. No aortic aneurysm. Mediastinum/Nodes: No enlarged mediastinal, hilar, or axillary lymph nodes. Thyroid gland, trachea, and esophagus demonstrate no significant findings. Lungs/Pleura: Right costophrenic angle is partially excluded from view. Bibasilar parenchymal scarring. No pneumothorax or pleural effusion. Bronchial wall thickening in keeping with airway inflammation. No central obstructing lesion. Musculoskeletal: The osseous structures are diffusely osteopenic. Degenerative changes are seen throughout the cervical, thoracic, and lumbar spine with ankylosis of the vertebral column and articular pillars bilaterally. Accentuated thoracic kyphosis. No focal lytic or blastic bone lesion. No acute bone abnormality. CT ABDOMEN PELVIS FINDINGS Hepatobiliary: Status post cholecystectomy. Moderate intra and extrahepatic biliary ductal dilation appears stable since prior examination likely representing post cholecystectomy change. The liver is unremarkable. Pancreas: Unremarkable Spleen: Unremarkable Adrenals/Urinary Tract: The adrenal glands are unremarkable. The kidneys are normal in size and position. In indeterminate 14 mm low-attenuation lesion is seen within the posterior interpolar region of the right kidney which did may demonstrate enhancing internal components but is not optimally characterized on this examination. Adjacent simple cortical cyst noted. The kidneys are otherwise unremarkable. The bladder is unremarkable. Stomach/Bowel: Mild sigmoid diverticulosis. The stomach, small bowel, and large bowel are otherwise unremarkable. Appendix normal. No free intraperitoneal gas or fluid. Vascular/Lymphatic: Fusiform infrarenal abdominal aortic aneurysm demonstrates slight interval increase in size measuring 4.4 x 5.0 cm in greatest dimension on axial image # 65/7 (previously measuring 4.4 x 4.7 cm). Mild superimposed aortoiliac atherosclerotic  calcification. No pathologic adenopathy within the abdomen and pelvis. Reproductive: Prostate is unremarkable. Other: No abdominal wall hernia.  No abdominopelvic ascites. Musculoskeletal: The osseous structures are diffusely osteopenic. Degenerative changes are seen within the lumbar spine and hips bilaterally. The sacroiliac joints and pubic symphysis appear fused. No acute bone abnormality. No lytic or blastic bone lesion. IMPRESSION: 1. No acute intrathoracic or intra-abdominal injury. 2. Mild multi-vessel coronary artery calcification. 3. Mild sigmoid diverticulosis. 4. 14 mm indeterminate lesion within the posterior interpolar region of the right kidney. This may represent a complex cyst, however, this is not optimally characterized on this examination. If clinically indicated, dedicated renal mass protocol CT or MRI examination is recommended for definitive evaluation. 5. Slight interval increase in size of a 5.0 cm infrarenal abdominal aortic aneurysm. Recommend follow-up CT/MR every 6 months and vascular consultation. This recommendation follows ACR consensus guidelines: White Paper of the ACR Incidental Findings Committee II on Vascular Findings. J Am Coll Radiol 2013; 10:789-794. 6. 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 Aortic Atherosclerosis (ICD10-I70.0). Aortic  aneurysm NOS (ICD10-I71.9). Electronically Signed   By: Helyn Numbers M.D.   On: 10/10/2022 20:46   CT Head Wo Contrast  Result Date: 10/10/2022 CLINICAL DATA:  Neck trauma EXAM: CT HEAD WITHOUT CONTRAST CT CERVICAL SPINE WITHOUT CONTRAST TECHNIQUE: Multidetector CT imaging of the head and cervical spine was performed following the standard protocol without intravenous contrast. Multiplanar CT image reconstructions of the cervical spine were also generated. RADIATION DOSE REDUCTION: This exam was performed according to the departmental dose-optimization program  which includes automated exposure control, adjustment of the mA and/or kV according to patient size and/or use of iterative reconstruction technique. COMPARISON:  CT C SPine 12/13/19 FINDINGS: CT HEAD FINDINGS Brain: No evidence of acute infarction, hemorrhage, hydrocephalus, extra-axial collection or mass lesion/mass effect. Sequela of moderate chronic microvascular ischemic change. Vascular: No hyperdense vessel or unexpected calcification. Skull: Normal. Negative for fracture or focal lesion. Sinuses/Orbits: No middle ear or mastoid effusion. Mucosal thickening right maxillary sinus and mild mucosal thickening in the bilateral ethmoid and left maxillary sinus. Bilateral lens replacement. Orbits are otherwise unremarkable. Other: None. CT CERVICAL SPINE FINDINGS Alignment: Normal. Skull base and vertebrae: No acute fracture. There are osseous findings of DISH with multiple bridging anterior osteophytes. There is a lytic lesion the base of the dens extending to the C2 vertebral body (series 8, image 26), unchanged from 2021. There is also an additional lucent lesion in the right occipital bone (series 5, image 6). Soft tissues and spinal canal: No prevertebral fluid or swelling. No visible canal hematoma. Disc levels:  No evidence of high-grade spinal canal stenosis. Upper chest: See separately dictated CT chest abdomen and pelvis for additional findings. Other: None IMPRESSION: 1. No acute intracranial abnormality. 2. No acute cervical spine fracture. Electronically Signed   By: Lorenza Cambridge M.D.   On: 10/10/2022 20:37   CT Cervical Spine Wo Contrast  Result Date: 10/10/2022 CLINICAL DATA:  Neck trauma EXAM: CT HEAD WITHOUT CONTRAST CT CERVICAL SPINE WITHOUT CONTRAST TECHNIQUE: Multidetector CT imaging of the head and cervical spine was performed following the standard protocol without intravenous contrast. Multiplanar CT image reconstructions of the cervical spine were also generated. RADIATION DOSE  REDUCTION: This exam was performed according to the departmental dose-optimization program which includes automated exposure control, adjustment of the mA and/or kV according to patient size and/or use of iterative reconstruction technique. COMPARISON:  CT C SPine 12/13/19 FINDINGS: CT HEAD FINDINGS Brain: No evidence of acute infarction, hemorrhage, hydrocephalus, extra-axial collection or mass lesion/mass effect. Sequela of moderate chronic microvascular ischemic change. Vascular: No hyperdense vessel or unexpected calcification. Skull: Normal. Negative for fracture or focal lesion. Sinuses/Orbits: No middle ear or mastoid effusion. Mucosal thickening right maxillary sinus and mild mucosal thickening in the bilateral ethmoid and left maxillary sinus. Bilateral lens replacement. Orbits are otherwise unremarkable. Other: None. CT CERVICAL SPINE FINDINGS Alignment: Normal. Skull base and vertebrae: No acute fracture. There are osseous findings of DISH with multiple bridging anterior osteophytes. There is a lytic lesion the base of the dens extending to the C2 vertebral body (series 8, image 26), unchanged from 2021. There is also an additional lucent lesion in the right occipital bone (series 5, image 6). Soft tissues and spinal canal: No prevertebral fluid or swelling. No visible canal hematoma. Disc levels:  No evidence of high-grade spinal canal stenosis. Upper chest: See separately dictated CT chest abdomen and pelvis for additional findings. Other: None IMPRESSION: 1. No acute intracranial abnormality. 2. No acute cervical spine fracture. Electronically Signed  By: Lorenza Cambridge M.D.   On: 10/10/2022 20:37   CT T-SPINE NO CHARGE  Result Date: 10/10/2022 CLINICAL DATA:  Trip and fall injury. EXAM: CT Thoracic and Lumbar spine with contrast TECHNIQUE: Multiplanar CT images of the thoracic and lumbar spine were reconstructed from contemporary CT of the Chest, Abdomen, and Pelvis. RADIATION DOSE REDUCTION: This  exam was performed according to the departmental dose-optimization program which includes automated exposure control, adjustment of the mA and/or kV according to patient size and/or use of iterative reconstruction technique. CONTRAST:  No additional IV contrast material was used. COMPARISON:  CT chest 06/16/2022 FINDINGS: CT THORACIC SPINE FINDINGS Alignment: Thoracic kyphosis without anterior subluxation. Vertebrae: No vertebral compression deformities. No focal bone lesion or bone destruction. Bridging anterior osteophyte or ligamentous calcification consistent with ankylosis. Paraspinal and other soft tissues: Soft tissue swelling and infiltration in the posterior paraspinal muscles in the midthoracic region towards the left likely representing soft tissue hematoma. Disc levels: Intervertebral disc spaces are preserved. CT LUMBAR SPINE FINDINGS Segmentation: 5 lumbar type vertebrae. Alignment: Normal alignment. Vertebrae: No vertebral compression deformities. No focal bone lesion or bone destruction. Bridging calcification anteriorly suggesting ankylosis. Ankylosis of the sacroiliac joints. Paraspinal and other soft tissues: Fatty atrophy of the posterior paraspinal muscles. No abnormal soft tissue swelling or infiltration. Disc levels: Intervertebral disc spaces are preserved. IMPRESSION: 1. Thoracic kyphosis. 2. 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 3. No acute displaced fractures are identified. Electronically Signed   By: Burman Nieves M.D.   On: 10/10/2022 20:35   CT L-SPINE NO CHARGE  Result Date: 10/10/2022 CLINICAL DATA:  Trip and fall injury. EXAM: CT Thoracic and Lumbar spine with contrast TECHNIQUE: Multiplanar CT images of the thoracic and lumbar spine were reconstructed from contemporary CT of the Chest, Abdomen, and Pelvis. RADIATION DOSE REDUCTION: This exam was performed according to the departmental dose-optimization program which includes automated exposure control,  adjustment of the mA and/or kV according to patient size and/or use of iterative reconstruction technique. CONTRAST:  No additional IV contrast material was used. COMPARISON:  CT chest 06/16/2022 FINDINGS: CT THORACIC SPINE FINDINGS Alignment: Thoracic kyphosis without anterior subluxation. Vertebrae: No vertebral compression deformities. No focal bone lesion or bone destruction. Bridging anterior osteophyte or ligamentous calcification consistent with ankylosis. Paraspinal and other soft tissues: Soft tissue swelling and infiltration in the posterior paraspinal muscles in the midthoracic region towards the left likely representing soft tissue hematoma. Disc levels: Intervertebral disc spaces are preserved. CT LUMBAR SPINE FINDINGS Segmentation: 5 lumbar type vertebrae. Alignment: Normal alignment. Vertebrae: No vertebral compression deformities. No focal bone lesion or bone destruction. Bridging calcification anteriorly suggesting ankylosis. Ankylosis of the sacroiliac joints. Paraspinal and other soft tissues: Fatty atrophy of the posterior paraspinal muscles. No abnormal soft tissue swelling or infiltration. Disc levels: Intervertebral disc spaces are preserved. IMPRESSION: 1. Thoracic kyphosis. 2. 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 3. No acute displaced fractures are identified. Electronically Signed   By: Burman Nieves M.D.   On: 10/10/2022 20:35    Microbiology: Results for orders placed or performed during the hospital encounter of 10/10/22  SARS Coronavirus 2 by RT PCR (hospital order, performed in [facility] hospital lab) *cepheid single result test* Anterior Nasal Swab     Status: Abnormal   Collection Time: 10/11/22  8:30 AM   Specimen: Anterior Nasal Swab  Result Value Ref Range Status   SARS Coronavirus 2 by RT PCR POSITIVE (A) NEGATIVE Final    Comment: (  NOTE) SARS-CoV-2 target nucleic acids are DETECTED  SARS-CoV-2 RNA is generally detectable in upper respiratory  specimens  during the acute phase of infection.  Positive results are indicative  of the presence of the identified virus, but do not rule out bacterial infection or co-infection with other pathogens not detected by the test.  Clinical correlation with patient history and  other diagnostic information is necessary to determine patient infection status.  The expected result is negative.  Fact Sheet for Patients:   RoadLapTop.co.za   Fact Sheet for Healthcare Providers:   http://kim-miller.com/    This test is not yet approved or cleared by the [place] FDA and  has been authorized for detection and/or diagnosis of SARS-CoV-2 by FDA under an Emergency Use Authorization (EUA).  This EUA will remain in effect (meaning this test can be used) for the duration of  the COVID-19 declaration under Section 564(b)(1)  of the Act, 21 U.S.C. section 360-bbb-3(b)(1), unless the authorization is terminated or revoked sooner.   Performed at [facility], [address] [place], [place] [address]     Labs: CBC: Recent Labs  Lab 10/10/22 1919 10/11/22 1201 10/11/22 2050 10/12/22 0611 10/14/22 0638  WBC 5.0 3.7* 3.8* 3.9* 6.7  NEUTROABS 2.4 DUPLICATE REQUEST  1.5* 2.3  --  3.2  HGB 13.0 11.9* 12.5* 11.3* 11.1*  HCT 41.2 37.4* 39.7 35.9* 34.2*  MCV 92.6 92.8 91.1 92.5 90.0  PLT 102* 95* 110* 99* 144*   Basic Metabolic Panel: Recent Labs  Lab 10/10/22 1919 10/12/22 0611 10/14/22 0638  NA 141 142 144  K 4.0 3.6 3.8  CL 108 112* 111  CO2 26 21* 24  GLUCOSE 136* 136* 113*  BUN 26* 26* 30*  CREATININE 1.22 1.15 1.16  CALCIUM 8.9 8.7* 9.1   Liver Function Tests: Recent Labs  Lab 10/10/22 1919 10/12/22 0611 10/14/22 0638  AST 25 28 33  ALT 12 18 31   ALKPHOS 57 47 51  BILITOT 0.7 0.6 1.0  PROT 6.6 6.4* 7.0  ALBUMIN 3.7 3.3* 3.6   CBG: Recent Labs  Lab 10/16/22 0048  GLUCAP 151*    Discharge time spent:  greater than 30 minutes.  Signed: Lurene Shadow, MD [facility] 10/16/2022

## 2022-10-16 NOTE — Progress Notes (Signed)
Pt was brought down and attempted x 2. Pt is unable to lay flat or lay on his back to obtain MRI. Pt refused to attempt MRI when asked if we could move him to the MRI table. Ordering MD notified if patients mentation or pain status changes to place MRI orders back in and we will be happy to attempt the patient once more.

## 2022-10-17 ENCOUNTER — Emergency Department (HOSPITAL_COMMUNITY): Payer: Medicare Other

## 2022-10-17 ENCOUNTER — Encounter (HOSPITAL_COMMUNITY): Payer: Self-pay | Admitting: Internal Medicine

## 2022-10-17 ENCOUNTER — Observation Stay (HOSPITAL_COMMUNITY): Payer: Medicare Other

## 2022-10-17 ENCOUNTER — Inpatient Hospital Stay (HOSPITAL_COMMUNITY)
Admission: EM | Admit: 2022-10-17 | Discharge: 2022-10-30 | DRG: 682 | Disposition: A | Discharge status: Skilled Nursing Facility | Payer: Medicare Other | Attending: Family Medicine | Admitting: Family Medicine

## 2022-10-17 DIAGNOSIS — M4810 Ankylosing hyperostosis [Forestier], site unspecified: Secondary | ICD-10-CM

## 2022-10-17 DIAGNOSIS — G894 Chronic pain syndrome: Secondary | ICD-10-CM

## 2022-10-17 DIAGNOSIS — Z791 Long term (current) use of non-steroidal anti-inflammatories (NSAID): Secondary | ICD-10-CM

## 2022-10-17 DIAGNOSIS — I6501 Occlusion and stenosis of right vertebral artery: Secondary | ICD-10-CM | POA: Diagnosis present

## 2022-10-17 DIAGNOSIS — S12110A Anterior displaced Type II dens fracture, initial encounter for closed fracture: Secondary | ICD-10-CM | POA: Diagnosis present

## 2022-10-17 DIAGNOSIS — N179 Acute kidney failure, unspecified: Principal | ICD-10-CM

## 2022-10-17 DIAGNOSIS — Z66 Do not resuscitate: Secondary | ICD-10-CM | POA: Diagnosis present

## 2022-10-17 DIAGNOSIS — W19XXXA Unspecified fall, initial encounter: Secondary | ICD-10-CM | POA: Diagnosis present

## 2022-10-17 DIAGNOSIS — Z818 Family history of other mental and behavioral disorders: Secondary | ICD-10-CM

## 2022-10-17 DIAGNOSIS — R54 Age-related physical debility: Secondary | ICD-10-CM

## 2022-10-17 DIAGNOSIS — S12191A Other nondisplaced fracture of second cervical vertebra, initial encounter for closed fracture: Secondary | ICD-10-CM | POA: Diagnosis not present

## 2022-10-17 DIAGNOSIS — E872 Acidosis, unspecified: Secondary | ICD-10-CM | POA: Diagnosis present

## 2022-10-17 DIAGNOSIS — S12100A Unspecified displaced fracture of second cervical vertebra, initial encounter for closed fracture: Secondary | ICD-10-CM

## 2022-10-17 DIAGNOSIS — Y92009 Unspecified place in unspecified non-institutional (private) residence as the place of occurrence of the external cause: Secondary | ICD-10-CM

## 2022-10-17 DIAGNOSIS — I110 Hypertensive heart disease with heart failure: Secondary | ICD-10-CM | POA: Diagnosis present

## 2022-10-17 DIAGNOSIS — G8929 Other chronic pain: Secondary | ICD-10-CM

## 2022-10-17 DIAGNOSIS — U071 COVID-19: Secondary | ICD-10-CM | POA: Diagnosis not present

## 2022-10-17 DIAGNOSIS — Z8616 Personal history of COVID-19: Secondary | ICD-10-CM

## 2022-10-17 DIAGNOSIS — D038 Melanoma in situ of other sites: Secondary | ICD-10-CM | POA: Diagnosis present

## 2022-10-17 DIAGNOSIS — Z888 Allergy status to other drugs, medicaments and biological substances status: Secondary | ICD-10-CM

## 2022-10-17 DIAGNOSIS — J449 Chronic obstructive pulmonary disease, unspecified: Secondary | ICD-10-CM | POA: Diagnosis present

## 2022-10-17 DIAGNOSIS — B957 Other staphylococcus as the cause of diseases classified elsewhere: Secondary | ICD-10-CM | POA: Diagnosis present

## 2022-10-17 DIAGNOSIS — R627 Adult failure to thrive: Secondary | ICD-10-CM | POA: Diagnosis present

## 2022-10-17 DIAGNOSIS — H548 Legal blindness, as defined in USA: Secondary | ICD-10-CM | POA: Diagnosis present

## 2022-10-17 DIAGNOSIS — H409 Unspecified glaucoma: Secondary | ICD-10-CM | POA: Diagnosis present

## 2022-10-17 DIAGNOSIS — E876 Hypokalemia: Secondary | ICD-10-CM | POA: Insufficient documentation

## 2022-10-17 DIAGNOSIS — S12101A Unspecified nondisplaced fracture of second cervical vertebra, initial encounter for closed fracture: Secondary | ICD-10-CM

## 2022-10-17 DIAGNOSIS — S83242S Other tear of medial meniscus, current injury, left knee, sequela: Secondary | ICD-10-CM

## 2022-10-17 DIAGNOSIS — M7650 Patellar tendinitis, unspecified knee: Secondary | ICD-10-CM

## 2022-10-17 DIAGNOSIS — I10 Essential (primary) hypertension: Secondary | ICD-10-CM | POA: Diagnosis present

## 2022-10-17 DIAGNOSIS — S83282S Other tear of lateral meniscus, current injury, left knee, sequela: Secondary | ICD-10-CM

## 2022-10-17 DIAGNOSIS — Z79891 Long term (current) use of opiate analgesic: Secondary | ICD-10-CM

## 2022-10-17 DIAGNOSIS — F32A Depression, unspecified: Secondary | ICD-10-CM | POA: Diagnosis present

## 2022-10-17 DIAGNOSIS — Z79899 Other long term (current) drug therapy: Secondary | ICD-10-CM

## 2022-10-17 DIAGNOSIS — M1712 Unilateral primary osteoarthritis, left knee: Secondary | ICD-10-CM

## 2022-10-17 DIAGNOSIS — I7143 Infrarenal abdominal aortic aneurysm, without rupture: Secondary | ICD-10-CM | POA: Diagnosis present

## 2022-10-17 DIAGNOSIS — M810 Age-related osteoporosis without current pathological fracture: Secondary | ICD-10-CM | POA: Diagnosis present

## 2022-10-17 DIAGNOSIS — M1711 Unilateral primary osteoarthritis, right knee: Secondary | ICD-10-CM

## 2022-10-17 DIAGNOSIS — C799 Secondary malignant neoplasm of unspecified site: Secondary | ICD-10-CM | POA: Diagnosis present

## 2022-10-17 DIAGNOSIS — H9190 Unspecified hearing loss, unspecified ear: Secondary | ICD-10-CM | POA: Diagnosis present

## 2022-10-17 DIAGNOSIS — D0362 Melanoma in situ of left upper limb, including shoulder: Secondary | ICD-10-CM | POA: Diagnosis present

## 2022-10-17 DIAGNOSIS — Z881 Allergy status to other antibiotic agents status: Secondary | ICD-10-CM

## 2022-10-17 DIAGNOSIS — R451 Restlessness and agitation: Secondary | ICD-10-CM | POA: Diagnosis present

## 2022-10-17 DIAGNOSIS — R7881 Bacteremia: Secondary | ICD-10-CM | POA: Diagnosis present

## 2022-10-17 DIAGNOSIS — K219 Gastro-esophageal reflux disease without esophagitis: Secondary | ICD-10-CM | POA: Diagnosis present

## 2022-10-17 DIAGNOSIS — M545 Low back pain, unspecified: Secondary | ICD-10-CM

## 2022-10-17 DIAGNOSIS — R296 Repeated falls: Secondary | ICD-10-CM | POA: Diagnosis present

## 2022-10-17 DIAGNOSIS — Z602 Problems related to living alone: Secondary | ICD-10-CM | POA: Diagnosis present

## 2022-10-17 DIAGNOSIS — M170 Bilateral primary osteoarthritis of knee: Secondary | ICD-10-CM

## 2022-10-17 DIAGNOSIS — Z8249 Family history of ischemic heart disease and other diseases of the circulatory system: Secondary | ICD-10-CM

## 2022-10-17 DIAGNOSIS — J441 Chronic obstructive pulmonary disease with (acute) exacerbation: Secondary | ICD-10-CM | POA: Diagnosis present

## 2022-10-17 DIAGNOSIS — K5900 Constipation, unspecified: Secondary | ICD-10-CM | POA: Diagnosis present

## 2022-10-17 DIAGNOSIS — I5032 Chronic diastolic (congestive) heart failure: Secondary | ICD-10-CM | POA: Diagnosis present

## 2022-10-17 LAB — CBC WITH DIFFERENTIAL/PLATELET
Abs Immature Granulocytes: 0.22 10*3/uL — ABNORMAL HIGH (ref 0.00–0.07)
Basophils Absolute: 0.1 10*3/uL (ref 0.0–0.1)
Basophils Relative: 0 %
Eosinophils Absolute: 0.1 10*3/uL (ref 0.0–0.5)
Eosinophils Relative: 1 %
HCT: 36.4 % — ABNORMAL LOW (ref 39.0–52.0)
Hemoglobin: 11.4 g/dL — ABNORMAL LOW (ref 13.0–17.0)
Immature Granulocytes: 2 %
Lymphocytes Relative: 16 %
Lymphs Abs: 1.9 10*3/uL (ref 0.7–4.0)
MCH: 29.5 pg (ref 26.0–34.0)
MCHC: 31.3 g/dL (ref 30.0–36.0)
MCV: 94.3 fL (ref 80.0–100.0)
Monocytes Absolute: 3.2 10*3/uL — ABNORMAL HIGH (ref 0.1–1.0)
Monocytes Relative: 28 %
Neutro Abs: 6.2 10*3/uL (ref 1.7–7.7)
Neutrophils Relative %: 53 %
Platelets: 201 10*3/uL (ref 150–400)
RBC: 3.86 MIL/uL — ABNORMAL LOW (ref 4.22–5.81)
RDW: 15.9 % — ABNORMAL HIGH (ref 11.5–15.5)
WBC: 11.6 10*3/uL — ABNORMAL HIGH (ref 4.0–10.5)
nRBC: 0 % (ref 0.0–0.2)

## 2022-10-17 LAB — PROCALCITONIN: Procalcitonin: <0.1 ng/mL

## 2022-10-17 LAB — BLOOD CULTURE ID PANEL (REFLEXED) - BCID2

## 2022-10-17 LAB — COMPREHENSIVE METABOLIC PANEL
ALT: 40 U/L (ref 0–44)
AST: 39 U/L (ref 15–41)
Albumin: 3.7 g/dL (ref 3.5–5.0)
Alkaline Phosphatase: 54 U/L (ref 38–126)
Anion gap: 13 (ref 5–15)
BUN: 45 mg/dL — ABNORMAL HIGH (ref 8–23)
CO2: 22 mmol/L (ref 22–32)
Calcium: 9.3 mg/dL (ref 8.9–10.3)
Chloride: 108 mmol/L (ref 98–111)
Creatinine, Ser: 1.9 mg/dL — ABNORMAL HIGH (ref 0.61–1.24)
GFR, Estimated: 34 mL/min — ABNORMAL LOW (ref >60)
Glucose, Bld: 138 mg/dL — ABNORMAL HIGH (ref 70–99)
Potassium: 3.1 mmol/L — ABNORMAL LOW (ref 3.5–5.1)
Sodium: 143 mmol/L (ref 135–145)
Total Bilirubin: 1.4 mg/dL — ABNORMAL HIGH (ref 0.3–1.2)
Total Protein: 6.9 g/dL (ref 6.5–8.1)

## 2022-10-17 LAB — I-STAT VENOUS BLOOD GAS, ED
Acid-base deficit: 4 mmol/L — ABNORMAL HIGH (ref 0.0–2.0)
Bicarbonate: 21.5 mmol/L (ref 20.0–28.0)
Calcium, Ion: 1.22 mmol/L (ref 1.15–1.40)
HCT: 30 % — ABNORMAL LOW (ref 39.0–52.0)
Hemoglobin: 10.2 g/dL — ABNORMAL LOW (ref 13.0–17.0)
O2 Saturation: 44 %
Potassium: 3.4 mmol/L — ABNORMAL LOW (ref 3.5–5.1)
Sodium: 144 mmol/L (ref 135–145)
TCO2: 23 mmol/L (ref 22–32)
pCO2, Ven: 38 mmHg — ABNORMAL LOW (ref 44–60)
pH, Ven: 7.361 (ref 7.25–7.43)
pO2, Ven: 25 mmHg — CL (ref 32–45)

## 2022-10-17 LAB — PROTIME-INR
INR: 1.1 (ref 0.8–1.2)
Prothrombin Time: 14.5 seconds (ref 11.4–15.2)

## 2022-10-17 LAB — URINALYSIS, W/ REFLEX TO CULTURE (INFECTION SUSPECTED)
Bilirubin Urine: NEGATIVE
Glucose, UA: NEGATIVE mg/dL
Hgb urine dipstick: NEGATIVE
Ketones, ur: NEGATIVE mg/dL
Leukocytes,Ua: NEGATIVE
Nitrite: NEGATIVE
Protein, ur: 100 mg/dL — AB
Specific Gravity, Urine: 1.026 (ref 1.005–1.030)
pH: 5 (ref 5.0–8.0)

## 2022-10-17 LAB — IRON AND TIBC
Iron: 36 ug/dL — ABNORMAL LOW (ref 45–182)
Saturation Ratios: 13 % — ABNORMAL LOW (ref 17.9–39.5)
TIBC: 280 ug/dL (ref 250–450)
UIBC: 244 ug/dL

## 2022-10-17 LAB — TYPE AND SCREEN
ABO/RH(D): O POS
Antibody Screen: NEGATIVE

## 2022-10-17 LAB — RESP PANEL BY RT-PCR (RSV, FLU A&B, COVID)  RVPGX2
Influenza A by PCR: NEGATIVE
Influenza B by PCR: NEGATIVE
Resp Syncytial Virus by PCR: NEGATIVE
SARS Coronavirus 2 by RT PCR: POSITIVE — AB

## 2022-10-17 LAB — LACTIC ACID, PLASMA
Lactic Acid, Venous: 4.6 mmol/L (ref 0.5–1.9)
Lactic Acid, Venous: 2.7 mmol/L (ref 0.5–1.9)

## 2022-10-17 LAB — CULTURE, BLOOD (ROUTINE X 2)

## 2022-10-17 LAB — FERRITIN: Ferritin: 38 ng/mL (ref 24–336)

## 2022-10-17 LAB — CBG MONITORING, ED: Glucose-Capillary: 113 mg/dL — ABNORMAL HIGH (ref 70–99)

## 2022-10-17 LAB — POC OCCULT BLOOD, ED: Fecal Occult Bld: POSITIVE — AB

## 2022-10-17 LAB — APTT: aPTT: 23 seconds — ABNORMAL LOW (ref 24–36)

## 2022-10-17 MED ORDER — IOHEXOL 350 MG/ML SOLN
75.0000 mL | Freq: Once | INTRAVENOUS | Status: AC | PRN
  Administered 2022-10-17: 75 mL via INTRAVENOUS

## 2022-10-17 MED ORDER — HYDROMORPHONE HCL 1 MG/ML IJ SOLN
0.5000 mg | INTRAMUSCULAR | Status: DC | PRN
  Administered 2022-10-17 – 2022-10-28 (×35): 1 mg via INTRAVENOUS
  Filled 2022-10-17 (×37): qty 1

## 2022-10-17 MED ORDER — ACETAMINOPHEN 650 MG RE SUPP: 650.0000 mg | Freq: Four times a day (QID) | RECTAL | Status: DC | PRN

## 2022-10-17 MED ORDER — ACETAMINOPHEN 325 MG PO TABS
650.0000 mg | ORAL_TABLET | Freq: Four times a day (QID) | ORAL | Status: DC | PRN
  Administered 2022-10-24: 650 mg via ORAL
  Filled 2022-10-17 (×3): qty 2

## 2022-10-17 MED ORDER — CARVEDILOL 12.5 MG PO TABS
12.5000 mg | ORAL_TABLET | Freq: Two times a day (BID) | ORAL | Status: DC
  Administered 2022-10-17 – 2022-10-30 (×28): 12.5 mg via ORAL
  Filled 2022-10-17 (×29): qty 1

## 2022-10-17 MED ORDER — VANCOMYCIN HCL 2000 MG/400ML IV SOLN
2000.0000 mg | Freq: Once | INTRAVENOUS | Status: AC
  Administered 2022-10-17: 2000 mg via INTRAVENOUS
  Filled 2022-10-17: qty 400

## 2022-10-17 MED ORDER — BUPRENORPHINE 10 MCG/HR TD PTWK
1.0000 | MEDICATED_PATCH | TRANSDERMAL | Status: DC
  Administered 2022-10-18 – 2022-10-25 (×2): 1 via TRANSDERMAL
  Filled 2022-10-17 (×2): qty 1

## 2022-10-17 MED ORDER — ACETAMINOPHEN 325 MG PO TABS
650.0000 mg | ORAL_TABLET | Freq: Four times a day (QID) | ORAL | Status: DC | PRN
  Filled 2022-10-17: qty 2

## 2022-10-17 MED ORDER — HYDROMORPHONE HCL 1 MG/ML IJ SOLN
1.0000 mg | Freq: Once | INTRAMUSCULAR | Status: AC
  Administered 2022-10-17: 1 mg via INTRAVENOUS
  Filled 2022-10-17: qty 1

## 2022-10-17 MED ORDER — LACTATED RINGERS IV SOLN: INTRAVENOUS | Status: DC

## 2022-10-17 MED ORDER — ENSURE ENLIVE PO LIQD
237.0000 mL | Freq: Two times a day (BID) | ORAL | Status: DC
  Administered 2022-10-18 (×2): 237 mL via ORAL

## 2022-10-17 MED ORDER — SODIUM CHLORIDE 0.9 % IV SOLN
2.0000 g | Freq: Once | INTRAVENOUS | Status: AC
  Administered 2022-10-17: 2 g via INTRAVENOUS
  Filled 2022-10-17: qty 12.5

## 2022-10-17 MED ORDER — ONDANSETRON HCL 4 MG PO TABS: 4.0000 mg | ORAL_TABLET | Freq: Four times a day (QID) | ORAL | Status: DC | PRN

## 2022-10-17 MED ORDER — FINASTERIDE 5 MG PO TABS
5.0000 mg | ORAL_TABLET | Freq: Every day | ORAL | Status: DC
  Administered 2022-10-17 – 2022-10-30 (×14): 5 mg via ORAL
  Filled 2022-10-17 (×14): qty 1

## 2022-10-17 MED ORDER — TAMSULOSIN HCL 0.4 MG PO CAPS
0.4000 mg | ORAL_CAPSULE | Freq: Every day | ORAL | Status: DC
  Administered 2022-10-17 – 2022-10-30 (×14): 0.4 mg via ORAL
  Filled 2022-10-17 (×13): qty 1

## 2022-10-17 MED ORDER — ACETAMINOPHEN 650 MG RE SUPP
650.0000 mg | RECTAL | Status: DC | PRN
  Filled 2022-10-17: qty 1

## 2022-10-17 MED ORDER — DULOXETINE HCL 60 MG PO CPEP
90.0000 mg | ORAL_CAPSULE | Freq: Every day | ORAL | Status: DC
  Administered 2022-10-17 – 2022-10-30 (×14): 90 mg via ORAL
  Filled 2022-10-17 (×14): qty 1

## 2022-10-17 MED ORDER — LORAZEPAM 2 MG/ML IJ SOLN
0.5000 mg | Freq: Once | INTRAMUSCULAR | Status: AC
  Administered 2022-10-17: 0.5 mg via INTRAVENOUS
  Filled 2022-10-17: qty 1

## 2022-10-17 MED ORDER — GABAPENTIN 300 MG PO CAPS
300.0000 mg | ORAL_CAPSULE | Freq: Every day | ORAL | Status: DC
  Administered 2022-10-17 – 2022-10-30 (×14): 300 mg via ORAL
  Filled 2022-10-17 (×14): qty 1

## 2022-10-17 MED ORDER — ORAL CARE MOUTH RINSE: 15.0000 mL | OROMUCOSAL | Status: DC | PRN

## 2022-10-17 MED ORDER — VANCOMYCIN VARIABLE DOSE PER UNSTABLE RENAL FUNCTION (PHARMACIST DOSING): Status: DC

## 2022-10-17 MED ORDER — VANCOMYCIN HCL IN DEXTROSE 1-5 GM/200ML-% IV SOLN: 1000.0000 mg | Freq: Once | INTRAVENOUS | Status: DC

## 2022-10-17 MED ORDER — ONDANSETRON HCL 4 MG/2ML IJ SOLN: 4.0000 mg | Freq: Four times a day (QID) | INTRAMUSCULAR | Status: DC | PRN

## 2022-10-17 MED ORDER — LACTATED RINGERS IV BOLUS
1000.0000 mL | Freq: Once | INTRAVENOUS | Status: AC
  Administered 2022-10-17: 1000 mL via INTRAVENOUS

## 2022-10-17 MED ORDER — METRONIDAZOLE 500 MG/100ML IV SOLN
500.0000 mg | Freq: Once | INTRAVENOUS | Status: AC
  Administered 2022-10-17: 500 mg via INTRAVENOUS
  Filled 2022-10-17: qty 100

## 2022-10-17 MED ORDER — LORAZEPAM 0.5 MG PO TABS
0.5000 mg | ORAL_TABLET | Freq: Two times a day (BID) | ORAL | Status: AC | PRN
  Administered 2022-10-18 – 2022-10-26 (×4): 0.5 mg via ORAL
  Filled 2022-10-17 (×4): qty 1

## 2022-10-17 MED ORDER — DIAZEPAM 5 MG/ML IJ SOLN
5.0000 mg | Freq: Once | INTRAMUSCULAR | Status: DC
  Filled 2022-10-17 (×2): qty 2

## 2022-10-17 MED ORDER — OXYCODONE HCL 5 MG PO TABS
5.0000 mg | ORAL_TABLET | ORAL | Status: DC | PRN
  Administered 2022-10-17 – 2022-10-29 (×32): 5 mg via ORAL
  Filled 2022-10-17 (×32): qty 1

## 2022-10-17 MED ORDER — LACTATED RINGERS IV BOLUS (SEPSIS)
2000.0000 mL | Freq: Once | INTRAVENOUS | Status: AC
  Administered 2022-10-17: 2000 mL via INTRAVENOUS

## 2022-10-17 MED ORDER — IOHEXOL 350 MG/ML SOLN: 75.0000 mL | Freq: Once | INTRAVENOUS | Status: DC | PRN

## 2022-10-17 MED ORDER — FENTANYL CITRATE PF 50 MCG/ML IJ SOSY
50.0000 ug | PREFILLED_SYRINGE | Freq: Once | INTRAMUSCULAR | Status: AC
  Administered 2022-10-17: 50 ug via INTRAVENOUS
  Filled 2022-10-17: qty 1

## 2022-10-17 MED ORDER — LACTATED RINGERS IV SOLN: INTRAVENOUS | Status: AC

## 2022-10-17 MED ORDER — TRAZODONE HCL 50 MG PO TABS
150.0000 mg | ORAL_TABLET | Freq: Every evening | ORAL | Status: DC | PRN
  Administered 2022-10-17 – 2022-10-29 (×8): 150 mg via ORAL
  Filled 2022-10-17 (×8): qty 3

## 2022-10-17 MED ORDER — OLANZAPINE 2.5 MG PO TABS
2.5000 mg | ORAL_TABLET | Freq: Two times a day (BID) | ORAL | Status: DC
  Administered 2022-10-17 – 2022-10-30 (×26): 2.5 mg via ORAL
  Filled 2022-10-17 (×30): qty 1

## 2022-10-17 NOTE — ED Notes (Signed)
Pt still in CT at this time. Unable to start antibiotics.

## 2022-10-17 NOTE — Hospital Course (Addendum)
Aaron Gibbs is a 89 y.o. male with a history of blindness, chronic pain, COPD, depression, GERD, hypertension. Patient presented secondary to a fall and found to have a C2 vertebral fracture. Neurosurgery consulted and recommended no surgical management. Patient with associated AKI that responded well to IV fluids. Discharged to SNF.

## 2022-10-17 NOTE — Assessment & Plan Note (Signed)
Stable.  On room air.  Patient given cefepime and vancomycin.  Check procalcitonin.  Do not think he has pneumonia or needs antibiotics.

## 2022-10-17 NOTE — Assessment & Plan Note (Signed)
Hold Bumex due to AKI.  Continue Coreg 12.5 mg twice daily.

## 2022-10-17 NOTE — ED Notes (Signed)
Patient in bed resting with eyes closed, no s/s of any distress. Will continue to monitor

## 2022-10-17 NOTE — Assessment & Plan Note (Signed)
On buprenorphine patch 10 mcg/h.

## 2022-10-17 NOTE — Progress Notes (Signed)
Elderly male with multiple medical problems and poor functional status presents after fall.  Workup demonstrates evidence of an atypical type III odontoid fracture with some extension into his right vertebral foramen.  CT angio pending.  Plan soft cervical collar for stabilization.  No need for surgical intervention.

## 2022-10-17 NOTE — Sepsis Progress Note (Signed)
Following for sepsis monitoring ?

## 2022-10-17 NOTE — ED Triage Notes (Signed)
Pt to ED via EMS from home. Pt has had recent falls. Pt states he was supposed to have CT done today but was unable to d/t back pain from recent falls. Pt in c-collar upon arrival to ED. Pt states he fell earlier tonight, called EMS but did not come to hospital. Pt had 2nd fall tonight and called EMS again. Pt confused at baseline. Pt AAOx3. Fall unwitnessed. No blood thinners. No LOC reported. Pt has abrasion to left forehead.   EMS Vitals: 125/70 96% RA 20 RR 120 HR

## 2022-10-17 NOTE — ED Notes (Signed)
Please call daughter [name] with an update.

## 2022-10-17 NOTE — Assessment & Plan Note (Signed)
Stable.  Not wheezing.  On room air.

## 2022-10-17 NOTE — Assessment & Plan Note (Signed)
Patient with frequent falls.  Palliative care consult for goals of care discussion.

## 2022-10-17 NOTE — Progress Notes (Signed)
PT Cancellation Note  Patient Details Name: Aaron Gibbs MRN: [id number] DOB: 08/24/1934   Cancelled Treatment:    Reason Eval/Treat Not Completed: Fatigue/lethargy limiting ability to participate (RN reports pt recently medicated for pain. Pt groggy and lethargic at PT arrival. Will follow up at later date/time as schedule allows and pt able.)   Renaldo Fiddler PT, DPT Acute Rehabilitation Services Office [phone]  10/17/22 3:33 PM

## 2022-10-17 NOTE — Consult Note (Signed)
Consultation Note Date: 10/17/2022   Patient Name: Aaron Gibbs  DOB: [birth year]  MRN: [id number]  Age / Sex: 88 y.o., male  PCP: Aaron Cords, DO Referring Physician: Joycelyn Das, MD  Reason for Consultation:  GOC discussion  HPI/Patient Profile: 88 y.o. male  with past medical history of diastolic heart failure, legally blind, COPD, depression, anxiety, chronic knee pain followed by [facility] recently discharged 4/29 from [facility] for falls and COVID admitted on 10/17/2022 with recurrent falls. Workup reveals possible sepsis, elevated lactic acid, COVID +, AKI, minimally displaced C2 fracture. Neurosurgery consulted- no surgical recommendations.  PMT consulted for GOC discussion.   Primary Decision Maker PATIENT - surrogate decision maker is daughter, Aaron Gibbs  Discussion: I have reviewed medical records including Care Everywhere, progress notes from this and prior admissions, labs and imaging.   History from caregiver at bedside and daughter via phone.   On evaluation patient is lethargic. He did not open his eyes to my voice or light touch.   I introduced Palliative Medicine as specialized medical care for people living with serious illness. It focuses on providing relief from the symptoms and stress of a serious illness. The goal is to improve quality of life for both the patient and the family.  We discussed a brief life review of the patient. He is retired from working as an Art gallery manager. Well traveled. Finds great joy in listening to audiobooks- especially Aaron Gibbs. He has full time caregivers in the home.   As far as functional and nutritional status - prior to admission he had difficulty ambulating. Lived mostly bed to chair. Left home mainly for MD appointments. Eats well, enjoys chocolate Ensures several times a day. He is blind so he needs assistance with  eating, needs to be fed.   We discussed patient's current illness and what it means in the larger context of patient's on-going co-morbidities.  Natural disease trajectory and expectations at EOL were discussed. We discussed his multiple comorbidities and geriatric frailty.  I attempted to elicit values and goals of care important to the patient. Aaron Gibbs shares primary goal at this time is for patient to go to SNF for rehab and eventually home. They want to avoid him living in a long term care facility.   The difference between aggressive medical intervention and comfort care was considered in light of the patient's goals of care. Aaron Gibbs has not been able to have good conversation with Aaron Gibbs about his view of quality of life and life sustaining interventions beyond DNR. She would like to continue current care in hope that his mental status can be restored to point where he can have that conversation.   If his condition were to worsen or fail to improve- Aaron Gibbs would want to discuss any further interventions before proceeding- considering comfort care as an alternative.  Advance directives, concepts specific to code status, artificial feeding- he has DNR, would not want artificial feeding.   Discussed with patient/family the importance of continued conversation with family and  the medical providers regarding overall plan of care and treatment options, ensuring decisions are within the context of the patient's values and GOCs.    He currently is followed outpatient by [facility].   Questions and concerns were addressed. The family was encouraged to call with questions or concerns.      SUMMARY OF RECOMMENDATIONS -Continue current care- if worsens discuss further interventions with Aaron Gibbs before proceeding with comfort care being an alternative option -Ensure BID -Order entered for assistance with meals   Code Status/Advance Care Planning: DNR   Prognosis:   Unable to  determine  Discharge Planning: Skilled Nursing Facility for rehab with Palliative care service follow-up  Primary Diagnoses: Present on Admission:  COVID-19  COPD (chronic obstructive pulmonary disease) (HCC)  Chronic pain syndrome  Essential hypertension  Chronic heart failure with preserved ejection fraction (HCC)  AKI (acute kidney injury) (HCC)   Review of Systems  Unable to perform ROS: Mental status change    Physical Exam Vitals and nursing note reviewed.  HENT:     Head:     Comments: L forehead laceration Pulmonary:     Effort: Pulmonary effort is normal.  Neurological:     Comments: Did not arouse to my voice or light touch     Vital Signs: BP (!) 157/80 (BP Location: Right Arm)   Pulse 63   Temp (!) 97.5 F (36.4 C) (Oral)   Resp 16   Ht 5\' 10"  (1.778 m)   Wt 91.7 kg   SpO2 94%   BMI 29.01 kg/m  Pain Scale: 0-10   Pain Score: Asleep   SpO2: SpO2: 94 % O2 Device:SpO2: 94 % O2 Flow Rate: .O2 Flow Rate (L/min): 2 L/min  IO: Intake/output summary:  Intake/Output Summary (Last 24 hours) at 10/17/2022 1632 Last data filed at 10/17/2022 0602 Gross per 24 hour  Intake 2187.24 ml  Output --  Net 2187.24 ml    LBM:   Baseline Weight: Weight: 95.3 kg Most recent weight: Weight: 91.7 kg       Thank you for this consult. Palliative medicine will continue to follow and assist as needed.  Time Total: 90 minutes Greater than 50%  of this time was spent counseling and coordinating care related to the above assessment and plan.  Signed by: Aaron Gibbs, AGNP-C Palliative Medicine    Please contact Palliative Medicine Team phone at [phone] for questions and concerns.  For individual provider: See Loretha Stapler

## 2022-10-17 NOTE — Assessment & Plan Note (Addendum)
Reviewed Vynca.  Patient transported with his DNR form.

## 2022-10-17 NOTE — Progress Notes (Signed)
NEW ADMISSION NOTE New Admission Note:   Arrival Method: Patient arrived from ED on stretcher. Mental Orientation: Alert and oriented x 2 Telemetry: N/A Assessment: Completed Skin: ecchymosis on bilateral arms, left upper and lower back, left shoulder, and on the left forehead. IV: placed an order to put one. Pain: asleep Tubes: N/A Safety Measures: Safety Fall Prevention Plan has been given, discussed and implemented. Admission: Completed [facility] Orientation: Patient currently confused.  Orders have been reviewed and implemented. Will continue to monitor the patient. Call light has been placed within reach and bed alarm has been activated.   Arvilla Meres, RN

## 2022-10-17 NOTE — Assessment & Plan Note (Addendum)
Chronic.  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

## 2022-10-17 NOTE — Assessment & Plan Note (Signed)
Hold Bumex.  Continue Coreg.

## 2022-10-17 NOTE — ED Notes (Signed)
Daughter updated 

## 2022-10-17 NOTE — H&P (Signed)
History and Physical    Aaron Gibbs ZOX:[id number] DOB: 1934/12/08 DOA: 10/17/2022  DOS: the patient was seen and examined on 10/17/2022  PCP: Smitty Cords, DO   Patient coming from: Home  I have personally briefly reviewed patient's old medical records in [facility]  CC: fall at home HPI: 88 year old male with a history of blindness, chronic pain on chronic narcotics, COPD, depression, GERD, hypertension who was discharged from [facility] yesterday who presents to the ER this morning after a fall.  He was discharged around 5 PM yesterday from [place] [facility].  Was discharged to home.  Apparently he fell while in the middle of the night.  He was brought to the ER via EMS.  Per EMS report, patient has had 2 falls at home since discharge.  Patient was quite agitated in the ER and required IV sedation.  He is unable to give history or review of systems.  No family is at bedside.  On arrival temp 97.8 which spiked to 101, heart rate 115, blood pressure 132/84.  He was admitted to [facility] with COVID-19 on 10/11/2022.  Sodium 143, potassium 4.1, BUN of 45, creatinine 1.9, glucose 138  COVID test remains positive. White count 11.6, hemoglobin 0.4, platelets of 201  CT head was negative for acute intracranial abnormality.  C-spine CT showed minimally displaced C2 fracture of the base of the odontoid.  The fracture extends into the lateral mass on the right.  Radiology recommended CTA of the neck for assessment of the right vertebral artery since the C2 fracture extends into the right transverse foramen.  CT abdomen pelvis showed a 5 x 4.3 partially thrombosed infrarenal AAA.  Neurosurgery has been consulted for his cervical fracture.  [facility] hospitalist contacted for admission due to AKI.   ED Course: CT c-spine shows C2 fracture. Scr 1.9, BUN 45  Review of Systems:  Review of Systems  Unable to perform ROS: Other   sedated  Past Medical History:  Diagnosis Date   Anxiety    Cancer (HCC)    skin   COPD (chronic obstructive pulmonary disease) (HCC)    Glaucoma    Osteoporosis    osteopenia   Suicide attempt (HCC) (05/13/18) 06/26/2018    Past Surgical History:  Procedure Laterality Date   CHOLECYSTECTOMY     TRANSURETHRAL RESECTION OF PROSTATE N/A 2008     reports that he has quit smoking. His smoking use included cigarettes. His smokeless tobacco use includes chew. He reports that he does not currently use alcohol. He reports that he does not use drugs.  Allergies  Allergen Reactions   Anthralin Shortness Of Breath   Azithromycin Shortness Of Breath    Family History  Problem Relation Age of Onset   Depression Mother    Heart disease Father     Prior to Admission medications   Medication Sig Start Date End Date Taking? Authorizing Provider  acetaminophen (TYLENOL) 500 MG tablet Take 1,000 mg by mouth in the morning, at noon, and at bedtime.    [provider]  bumetanide (BUMEX) 1 MG tablet Take 1/2 (one-half) tablet by mouth twice daily 09/20/22   Karamalegos, Netta Neat, DO  buprenorphine (BUTRANS) 10 MCG/HR PTWK Place 1 patch onto the skin once a week for 28 days. Apply only 1 patch at a time and alternate sites weekly. 10/04/22 11/01/22  Delano Metz, MD  buprenorphine (BUTRANS) 10 MCG/HR PTWK Place 1 patch onto the skin once a week for 28  days. Apply only 1 patch at a time and alternate sites weekly. 11/01/22 11/29/22  Delano Metz, MD  buprenorphine (BUTRANS) 10 MCG/HR PTWK Place 1 patch onto the skin once a week for 28 days. Apply only 1 patch at a time and alternate sites weekly. 11/29/22 12/27/22  Delano Metz, MD  carvedilol (COREG) 12.5 MG tablet Take 1 tablet by mouth twice daily 09/14/22   Althea Charon, Netta Neat, DO  COSOPT 22.3-6.8 MG/ML ophthalmic solution Place 1 drop into both eyes 2 (two) times daily. 06/25/20   [provider]   diclofenac Sodium (VOLTAREN) 1 % GEL Apply 2 g topically at bedtime. Apply to both knees twice daily 02/10/21 09/28/22  Smitty Cords, DO  DULoxetine (CYMBALTA) 30 MG capsule Take 3 capsules (90 mg total) by mouth daily. 05/08/22   Karamalegos, Netta Neat, DO  finasteride (PROSCAR) 5 MG tablet Take 1 tablet (5 mg total) by mouth daily. 04/24/22   Karamalegos, Netta Neat, DO  gabapentin (NEURONTIN) 300 MG capsule Take 1 capsule (300 mg total) by mouth daily. 06/06/22   Karamalegos, Netta Neat, DO  Iron, Ferrous Sulfate, 325 (65 Fe) MG TABS Take 325 mg by mouth daily with supper. 06/06/22   Karamalegos, Netta Neat, DO  latanoprost (XALATAN) 0.005 % ophthalmic solution Place 1 drop into both eyes at bedtime. 02/28/22   [provider]  LORazepam (ATIVAN) 0.5 MG tablet Take 1 tablet (0.5 mg total) by mouth in the morning, at noon, and at bedtime. 07/17/22   Karamalegos, Netta Neat, DO  Melatonin 5 MG CAPS Take 1 capsule (5 mg total) by mouth at bedtime. 02/03/22   Karamalegos, Netta Neat, DO  meloxicam (MOBIC) 15 MG tablet Take 15 mg by mouth daily. 06/14/22   [provider]  OLANZapine (ZYPREXA) 2.5 MG tablet Take 1 tablet by mouth twice daily 09/29/22   Lorre Munroe, NP  pantoprazole (PROTONIX) 40 MG tablet Take 1 tablet (40 mg total) by mouth daily. 05/01/22   Karamalegos, Netta Neat, DO  Propylene Glycol (SYSTANE BALANCE OP) Place 1 drop into the left eye daily.    [provider]  tamsulosin (FLOMAX) 0.4 MG CAPS capsule Take 1 capsule by mouth once daily 09/21/22   Smitty Cords, DO  traZODone (DESYREL) 150 MG tablet TAKE 1 TABLET BY MOUTH AT BEDTIME AS NEEDED 09/01/22   Smitty Cords, DO    Physical Exam: Vitals:   10/17/22 0135 10/17/22 0137 10/17/22 0145 10/17/22 0230  BP:  132/84  121/71  Pulse: (!) 115   (!) 104  Resp: (!) 22   (!) 30  Temp: 97.8 F (36.6 C)  (!) 101 F (38.3 C)   TempSrc: Oral  Rectal   SpO2: 98%   92%   Weight:    95.3 kg  Height:    5\' 10"  (1.778 m)    Physical Exam Vitals and nursing note reviewed.  Constitutional:      Comments: Chronically ill appearing  HENT:     Head: Normocephalic.  Neck:     Comments: Wearing hard collar Cardiovascular:     Rate and Rhythm: Normal rate and regular rhythm.  Pulmonary:     Effort: Pulmonary effort is normal. No respiratory distress.  Abdominal:     General: Abdomen is protuberant. There is no distension.     Tenderness: There is no abdominal tenderness.  Musculoskeletal:     Right lower leg: Edema present.     Left lower leg: Edema present.  Comments: Trace ankle/pedal edema  Skin:    Findings: Bruising present.     Comments: Multiple early bruising and abrasions on his knees  Neurological:     Comments: sedated      Labs on Admission: I have personally reviewed following labs and imaging studies  CBC: Recent Labs  Lab 10/10/22 1919 10/11/22 1201 10/11/22 2050 10/12/22 0611 10/14/22 0638 10/17/22 0141 10/17/22 0232  WBC 5.0 3.7* 3.8* 3.9* 6.7 11.6*  --   NEUTROABS 2.4 DUPLICATE REQUEST  1.5* 2.3  --  3.2 6.2  --   HGB 13.0 11.9* 12.5* 11.3* 11.1* 11.4* 10.2*  HCT 41.2 37.4* 39.7 35.9* 34.2* 36.4* 30.0*  MCV 92.6 92.8 91.1 92.5 90.0 94.3  --   PLT 102* 95* 110* 99* 144* 201  --    Basic Metabolic Panel: Recent Labs  Lab 10/10/22 1919 10/12/22 0611 10/14/22 0638 10/17/22 0141 10/17/22 0232  NA 141 142 144 143 144  K 4.0 3.6 3.8 3.1* 3.4*  CL 108 112* 111 108  --   CO2 26 21* 24 22  --   GLUCOSE 136* 136* 113* 138*  --   BUN 26* 26* 30* 45*  --   CREATININE 1.22 1.15 1.16 1.90*  --   CALCIUM 8.9 8.7* 9.1 9.3  --    GFR: Estimated Creatinine Clearance: 32.9 mL/min (A) (by C-G formula based on SCr of 1.9 mg/dL (H)). Liver Function Tests: Recent Labs  Lab 10/10/22 1919 10/12/22 0611 10/14/22 0638 10/17/22 0141  AST 25 28 33 39  ALT 12 18 31  40  ALKPHOS 57 47 51 54  BILITOT 0.7 0.6 1.0 1.4*  PROT  6.6 6.4* 7.0 6.9  ALBUMIN 3.7 3.3* 3.6 3.7   Recent Labs  Lab 10/10/22 1919  LIPASE 20    Coagulation Profile: Recent Labs  Lab 10/17/22 0141  INR 1.1   Cardiac Enzymes: Recent Labs  Lab 10/10/22 1919  CKTOTAL 45*   BNP (last 3 results) Recent Labs    01/22/22 1053 04/30/22 1214 05/27/22 0801  BNP 17.9 182.9* 156.0*   HbA1C: No results for input(s): "HGBA1C" in the last 72 hours. CBG: Recent Labs  Lab 10/16/22 0048 10/17/22 0240  GLUCAP 151* 113*   Urine analysis:    Component Value Date/Time   COLORURINE AMBER (A) 10/17/2022 0153   APPEARANCEUR HAZY (A) 10/17/2022 0153   LABSPEC 1.026 10/17/2022 0153   PHURINE 5.0 10/17/2022 0153   GLUCOSEU NEGATIVE 10/17/2022 0153   HGBUR NEGATIVE 10/17/2022 0153   BILIRUBINUR NEGATIVE 10/17/2022 0153   BILIRUBINUR Negative 07/12/2020 1348   KETONESUR NEGATIVE 10/17/2022 0153   PROTEINUR 100 (A) 10/17/2022 0153   UROBILINOGEN 0.2 07/12/2020 1348   NITRITE NEGATIVE 10/17/2022 0153   LEUKOCYTESUR NEGATIVE 10/17/2022 0153    Radiological Exams on Admission: I have personally reviewed images CT Lumbar Spine Wo Contrast  Result Date: 10/17/2022 CLINICAL DATA:  88 year old male with recent falls. Spinal ankylosis. EXAM: CT LUMBAR SPINE WITH CONTRAST TECHNIQUE: Technique: Multiplanar CT images of the lumbar spine were reconstructed from contemporary CT of the Abdomen and Pelvis. RADIATION DOSE REDUCTION: This exam was performed according to the departmental dose-optimization program which includes automated exposure control, adjustment of the mA and/or kV according to patient size and/or use of iterative reconstruction technique. CONTRAST:  No additional COMPARISON:  CT Abdomen and Pelvis today reported separately. Thoracic and lumbar spine CT 10/10/2022. FINDINGS: Segmentation: Normal on the comparison. There is a vestigial S1-S2 disc space. Alignment: Stable lumbar lordosis. No significant  lumbar scoliosis or spondylolisthesis.  Vertebrae: Partially visible diffuse spinal, bilateral SI joint ankylosis and bilateral lower rib costovertebral as noted on the recent comparisons. Associated osteopenia. Maintained lower thoracic and lumbar vertebral height and alignment. No acute osseous abnormality identified. Paraspinal and other soft tissues: Lower chest, abdomen and pelvis viscera are detailed separately. Paraspinal soft tissues remarkable for generalized paraspinal muscle atrophy. Also T12 level left paraspinal hematoma or contusion partially visible on series 3, image 39 and better demonstrated on series 4, image 15 of the CT Abdomen and Pelvis today. This measures about 7 cm long axis, situated in the atrophied paraspinal muscle and subcutaneous layer. No nearby posterior left lower rib fracture is identified. Disc levels: Diffuse spinal ankylosis. No CT evidence of lumbar spinal stenosis. IMPRESSION: 1. Diffuse spinal ankylosis (ankylosing spondylitis) with no acute osseous abnormality in the Lumbar Spine. 2. Left paraspinal hematoma (7 cm) at the T12 vertebral level. 3. CT Abdomen and Pelvis today reported separately. Electronically Signed   By: Odessa Fleming M.D.   On: 10/17/2022 04:46   CT ABDOMEN PELVIS W CONTRAST  Result Date: 10/17/2022 CLINICAL DATA:  Frequent falls. Most recently yesterday. Abdominal and back pain secondary to falls. EXAM: CT ABDOMEN AND PELVIS WITH CONTRAST TECHNIQUE: Multidetector CT imaging of the abdomen and pelvis was performed using the standard protocol following bolus administration of intravenous contrast. RADIATION DOSE REDUCTION: This exam was performed according to the departmental dose-optimization program which includes automated exposure control, adjustment of the mA and/or kV according to patient size and/or use of iterative reconstruction technique. CONTRAST:  75mL OMNIPAQUE IOHEXOL 350 MG/ML SOLN COMPARISON:  Chest, abdomen and pelvis CT with IV contrast 1 week ago 10/10/2022. FINDINGS: Technical  note: Patient could not lie flat and was scanned right lateral decubitus. The patient's arms were also in the field creating streak artifact. Lower chest: There are positional atelectatic changes on the right, linear scarring or atelectasis in the lingula. Lung bases are clear of infiltrate. The cardiac size is normal. Hepatobiliary: Not as well seen as previously due to artifact from breathing motion and the patient's arms in the field. No mass or laceration is seen through the artifacts. The liver is mildly steatotic. Old cholecystectomy again noted with extrahepatic and central intrahepatic biliary prominence, as before, with the common bile duct again measuring 13 mm. No ductal filling defect is seen. Pancreas: Moderately atrophic.  No other visible abnormality. Spleen: No mass or laceration is seen through the motion and streak artifact. Adrenals/Urinary Tract: There is no adrenal mass. Again noted is a 1.4 cm heterogeneous indeterminate lesion of the dorsal superior pole right kidney, better seen on the prior study, slightly smaller simple cyst more laterally in the upper pole. No other focal renal abnormality is seen. Renal mass protocol MRI is recommended when clinically feasible. There is no urinary stone or obstruction, no bladder thickening. Stomach/Bowel: No dilatation or wall thickening, including the appendix. There is colonic diverticulosis without evidence of diverticulitis. Vascular/Lymphatic: There is a partially thrombosed fusiform infrarenal AAA again measuring 5 x 4.3 cm. There is aortoiliac atherosclerosis. No adenopathy is seen. Reproductive: Prostate is unremarkable. Other: No abdominal wall hernia or focal abnormality. No abdominopelvic ascites. There is moderate abdominal laxity likely obesity related. Musculoskeletal: Diffuse osteopenia. There are diffuse bridging syndesmophytes of the visualized lower thoracic and lumbar spine. There is bony fusion across the distal spinous processes from  the lower thoracic spine down. The SI joints are fused. There is moderate bilateral hip DJD. No acute  or other significant osseous findings. IMPRESSION: 1. No acute trauma related findings in the abdomen or pelvis. Image quality degraded by motion and streak artifact in the abdomen. 2. 5 x 4.3 cm partially thrombosed fusiform infrarenal AAA. Recommend follow-up every 6 months and vascular consultation. Reference: J Am Coll Radiol 2013;10:789-794. 3. 1.4 cm heterogeneous indeterminate lesion of the dorsal superior pole right kidney, better seen on the prior study. Renal mass protocol MRI is recommended when clinically feasible. 4. Diverticulosis without evidence of diverticulitis. 5. Aortic atherosclerosis. 6. Diffuse spinal bridging syndesmophytes with bony bridging across the distal spinous processes. Bilaterally fused SI joints. Aortic Atherosclerosis (ICD10-I70.0). Electronically Signed   By: Almira Bar M.D.   On: 10/17/2022 04:18   CT HEAD WO CONTRAST ( )  Result Date: 10/17/2022 CLINICAL DATA:  Fall EXAM: CT HEAD WITHOUT CONTRAST CT CERVICAL SPINE WITHOUT CONTRAST TECHNIQUE: Multidetector CT imaging of the head and cervical spine was performed following the standard protocol without intravenous contrast. Multiplanar CT image reconstructions of the cervical spine were also generated. RADIATION DOSE REDUCTION: This exam was performed according to the departmental dose-optimization program which includes automated exposure control, adjustment of the mA and/or kV according to patient size and/or use of iterative reconstruction technique. COMPARISON:  10/10/2022 FINDINGS: CT HEAD FINDINGS Brain: There is no mass, hemorrhage or extra-axial collection. The size and configuration of the ventricles and extra-axial CSF spaces are normal. There is hypoattenuation of the periventricular white matter, most commonly indicating chronic ischemic microangiopathy. Vascular: No abnormal hyperdensity of the major  intracranial arteries or dural venous sinuses. No intracranial atherosclerosis. Skull: The visualized skull base, calvarium and extracranial soft tissues are normal. Sinuses/Orbits: No fluid levels or advanced mucosal thickening of the visualized paranasal sinuses. No mastoid or middle ear effusion. The orbits are normal. CT CERVICAL SPINE FINDINGS Alignment: No static subluxation. Facets are aligned. Occipital condyles are normally positioned. Skull base and vertebrae: Minimally displaced fracture of C2, at the base of the odontoid. Fracture extends to the right lateral mass. (AO Spine C2 type A). No other acute fracture. There is diffuse ankylosis. Soft tissues and spinal canal: No prevertebral fluid or swelling. No visible canal hematoma. Disc levels: No advanced spinal canal or neural foraminal stenosis. Upper chest: No pneumothorax, pulmonary nodule or pleural effusion. Other: Normal visualized paraspinal cervical soft tissues. IMPRESSION: 1. No acute intracranial abnormality. 2. Minimally displaced fracture of C2, at the base of the odontoid. Fracture extends to the right lateral mass (AO Spine C2 type A). 3. Consider CTA neck for assessment of the right vertebral artery since the C2 fracture affects the right transverse foramen. These results were called by telephone at the time of interpretation on 10/17/2022 at 3:55 am to provider Roxy Horseman , who verbally acknowledged these results. Electronically Signed   By: Deatra Robinson M.D.   On: 10/17/2022 03:56   CT Cervical Spine Wo Contrast  Result Date: 10/17/2022 CLINICAL DATA:  Fall EXAM: CT HEAD WITHOUT CONTRAST CT CERVICAL SPINE WITHOUT CONTRAST TECHNIQUE: Multidetector CT imaging of the head and cervical spine was performed following the standard protocol without intravenous contrast. Multiplanar CT image reconstructions of the cervical spine were also generated. RADIATION DOSE REDUCTION: This exam was performed according to the departmental  dose-optimization program which includes automated exposure control, adjustment of the mA and/or kV according to patient size and/or use of iterative reconstruction technique. COMPARISON:  10/10/2022 FINDINGS: CT HEAD FINDINGS Brain: There is no mass, hemorrhage or extra-axial collection. The size and configuration of the  ventricles and extra-axial CSF spaces are normal. There is hypoattenuation of the periventricular white matter, most commonly indicating chronic ischemic microangiopathy. Vascular: No abnormal hyperdensity of the major intracranial arteries or dural venous sinuses. No intracranial atherosclerosis. Skull: The visualized skull base, calvarium and extracranial soft tissues are normal. Sinuses/Orbits: No fluid levels or advanced mucosal thickening of the visualized paranasal sinuses. No mastoid or middle ear effusion. The orbits are normal. CT CERVICAL SPINE FINDINGS Alignment: No static subluxation. Facets are aligned. Occipital condyles are normally positioned. Skull base and vertebrae: Minimally displaced fracture of C2, at the base of the odontoid. Fracture extends to the right lateral mass. (AO Spine C2 type A). No other acute fracture. There is diffuse ankylosis. Soft tissues and spinal canal: No prevertebral fluid or swelling. No visible canal hematoma. Disc levels: No advanced spinal canal or neural foraminal stenosis. Upper chest: No pneumothorax, pulmonary nodule or pleural effusion. Other: Normal visualized paraspinal cervical soft tissues. IMPRESSION: 1. No acute intracranial abnormality. 2. Minimally displaced fracture of C2, at the base of the odontoid. Fracture extends to the right lateral mass (AO Spine C2 type A). 3. Consider CTA neck for assessment of the right vertebral artery since the C2 fracture affects the right transverse foramen. These results were called by telephone at the time of interpretation on 10/17/2022 at 3:55 am to provider Roxy Horseman , who verbally acknowledged  these results. Electronically Signed   By: Deatra Robinson M.D.   On: 10/17/2022 03:56   DG Pelvis Portable  Result Date: 10/17/2022 CLINICAL DATA:  Recent fall with pelvic pain, initial encounter EXAM: PORTABLE PELVIS 1-2 VIEWS COMPARISON:  10/10/2022 FINDINGS: Degenerative changes of the hip joints are noted bilaterally. Pelvic ring is rotated to the right although no acute fracture is seen. No soft tissue abnormality is noted. IMPRESSION: No acute abnormality noted. Electronically Signed   By: Alcide Clever M.D.   On: 10/17/2022 03:03   DG Chest Port 1 View  Result Date: 10/17/2022 CLINICAL DATA:  Recent fall with chest pain, initial encounter EXAM: PORTABLE CHEST 1 VIEW COMPARISON:  06/16/2022 FINDINGS: Cardiac shadow is within normal limits. Patient is significantly rotated to the right accentuating the mediastinal markings. The lungs are clear. No acute bony abnormality is noted. IMPRESSION: No acute abnormality noted. Electronically Signed   By: Alcide Clever M.D.   On: 10/17/2022 03:02    EKG: My personal interpretation of EKG shows: afib    Assessment/Plan Principal Problem:   AKI (acute kidney injury) (HCC) Active Problems:   C2 cervical fracture (HCC)   Fall at home, initial encounter   COVID-19   Chronic pain syndrome   Essential hypertension   COPD (chronic obstructive pulmonary disease) (HCC)   Chronic heart failure with preserved ejection fraction (HCC)   DNR (do not resuscitate)   Infrarenal abdominal aortic aneurysm (AAA) without rupture (HCC)   Hypokalemia    Assessment and Plan: * AKI (acute kidney injury) (HCC) Admit to observation medical bed.  Continue IV fluids.  Hold nephrotoxic agents.  C2 cervical fracture [facility]) Neurosurgery consulted.  Will obtain CT angio of the neck per radiology recommendations due to the patient's cervical fracture that extends into the right lateral mass and into the transverse foramen.  COVID-19 Stable.  On room air.  Patient given  cefepime and vancomycin.  Check procalcitonin.  Do not think he has pneumonia or needs antibiotics.  Fall at home, initial encounter Patient with frequent falls.  Palliative care consult for goals of care discussion.  DNR (do not resuscitate) Reviewed Vynca.  Patient transported with his DNR form.    Chronic heart failure with preserved ejection fraction (HCC) Hold Bumex.  Continue Coreg.  COPD (chronic obstructive pulmonary disease) (HCC) Stable.  Not wheezing.  On room air.  Essential hypertension Hold Bumex due to AKI.  Continue Coreg 12.5 mg twice daily.  Chronic pain syndrome On buprenorphine patch 10 mcg/h.  Hypokalemia Replete with IV kcl.  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   DVT prophylaxis: SCDs Code Status: DNR/DNI(Do NOT Intubate). Reviewed VYNCA. Reviewed DNR form Family Communication: no family at bedside  Disposition Plan: return home vs SNF  Consults called: palliative care consulted  Admission status: Observation, Med-Surg   Carollee Herter, DO [facility] 10/17/2022, 4:53 AM

## 2022-10-17 NOTE — Subjective & Objective (Signed)
CC: fall at home HPI: 88 year old male with a history of blindness, chronic pain on chronic narcotics, COPD, depression, GERD, hypertension who was discharged from [facility] yesterday who presents to the ER this morning after a fall.  He was discharged around 5 PM yesterday from [facility].  Was discharged to home.  Apparently he fell while in the middle of the night.  He was brought to the ER via EMS.  Per EMS report, patient has had 2 falls at home since discharge.  Patient was quite agitated in the ER and required IV sedation.  He is unable to give history or review of systems.  No family is at bedside.  On arrival temp 97.8 which spiked to 101, heart rate 115, blood pressure 132/84.  He was admitted to [facility] with COVID-19 on 10/11/2022.  Sodium 143, potassium 4.1, BUN of 45, creatinine 1.9, glucose 138  COVID test remains positive. White count 11.6, hemoglobin 0.4, platelets of 201  CT head was negative for acute intracranial abnormality.  C-spine CT showed minimally displaced C2 fracture of the base of the odontoid.  The fracture extends into the lateral mass on the right.  Radiology recommended CTA of the neck for assessment of the right vertebral artery since the C2 fracture extends into the right transverse foramen.  CT abdomen pelvis showed a 5 x 4.3 partially thrombosed infrarenal AAA.  Neurosurgery has been consulted for his cervical fracture.  [facility] hospitalist contacted for admission due to AKI.

## 2022-10-17 NOTE — Assessment & Plan Note (Signed)
Neurosurgery consulted.  Will obtain CT angio of the neck per radiology recommendations due to the patient's cervical fracture that extends into the right lateral mass and into the transverse foramen.

## 2022-10-17 NOTE — Progress Notes (Signed)
Pharmacy Antibiotic Note  Aaron Gibbs is a 88 y.o. male admitted on 10/17/2022 after fall PTA. Pharmacy has been consulted for Vancomycin dosing for bacteremia after BCID showed Staph species in 2 of 4 bottles, no resistance detected.  Received Vancomycin 2gm IV in ED at 6am today. Also received Cefepime 2gm IV and Metronidazole 500 mg IV.  Was inpatient at [facility] 4/23 >>10/16/22 with COVID-19 infection and COPD exacerbation. Completed 5 days of Doxycycline 4/24>>4/28.  AKI. Creatinine up to 1.9. On IV fluids.   Plan: No standing Vancomycin dose yet. Loading dose given 4/30 am. Will follow up am labs to determine timing of next Vancomycin dose. Currently expecting ~36 hours after initial dose (5/1 pm). Follow renal function, culture data, clinical progress and antibiotic plans.  Height: 5\' 10"  (177.8 cm) Weight: 91.7 kg (202 lb 2.6 oz) IBW/kg (Calculated) : 73  Temp (24hrs), Avg:98.7 F (37.1 C), Min:97.5 F (36.4 C), Max:101 F (38.3 C)  Recent Labs  Lab 10/11/22 1201 10/11/22 2050 10/12/22 0611 10/14/22 0638 10/17/22 0141 10/17/22 0510  WBC 3.7* 3.8* 3.9* 6.7 11.6*  --   CREATININE  --   --  1.15 1.16 1.90*  --   LATICACIDVEN  --   --   --   --  4.6* 2.7*    Estimated Creatinine Clearance: 32.4 mL/min (A) (by C-G formula based on SCr of 1.9 mg/dL (H)).    Allergies  Allergen Reactions   Anthralin Shortness Of Breath   Azithromycin Shortness Of Breath    Antimicrobials this admission: Vancomycin 4/30 >> Cefepime x 1 on 4/30 Metronidazole x 1 on 4/30   Dose adjustments this admission: n/a  Microbiology results: 4/30 COVID+ (also + on 4/24 - no respiratory symptoms) 4/30 Flu and RSV: negative 4/30 blood: GPC in clusters, pending 4/30 BCID: Staph species in 2 of 4 bottles, no resistance - MRSA PCR: pending  Thank you for allowing pharmacy to be a part of this patient's care.  Dennie Fetters, [place] 10/17/2022 9:44 PM

## 2022-10-17 NOTE — Progress Notes (Signed)
Pts daughter Melina Modena) refused CT scan. States even with medication, Pt cannot lay on his back. States hes sleeping now and believes it would cause more harm then good.  Notified DR and CT.

## 2022-10-17 NOTE — Assessment & Plan Note (Signed)
Replete with IV kcl 

## 2022-10-17 NOTE — Progress Notes (Signed)
PHARMACY - PHYSICIAN COMMUNICATION CRITICAL VALUE ALERT - BLOOD CULTURE IDENTIFICATION (BCID)  Aaron Gibbs is an 88 y.o. male who presented to [facility] on 10/17/2022 s/p fall at home.  Assessment:  2 of 4 bottles grow Staph spp, no resistance.  Tmax 101, WBC mildly elevated at 11.6, lactate 2.7, PCT negative.  Name of physician (or Provider) Contacted: Dr. Margo Aye  Current antibiotics: none (received one dose of cefepime/flagyl/vanc in ED)  Changes to prescribed antibiotics recommended:  MD will decide on whether to add abx  Results for orders placed or performed during the hospital encounter of 10/17/22  Blood Culture ID Panel (Reflexed) (Collected: 10/17/2022  2:33 AM)  Result Value Ref Range   Enterococcus faecalis NOT DETECTED NOT DETECTED   Enterococcus Faecium NOT DETECTED NOT DETECTED   Listeria monocytogenes NOT DETECTED NOT DETECTED   Staphylococcus species DETECTED (A) NOT DETECTED   Staphylococcus aureus (BCID) NOT DETECTED NOT DETECTED   Staphylococcus epidermidis NOT DETECTED NOT DETECTED   Staphylococcus lugdunensis NOT DETECTED NOT DETECTED   Streptococcus species NOT DETECTED NOT DETECTED   Streptococcus agalactiae NOT DETECTED NOT DETECTED   Streptococcus pneumoniae NOT DETECTED NOT DETECTED   Streptococcus pyogenes NOT DETECTED NOT DETECTED   A.calcoaceticus-baumannii NOT DETECTED NOT DETECTED   Bacteroides fragilis NOT DETECTED NOT DETECTED   Enterobacterales NOT DETECTED NOT DETECTED   Enterobacter cloacae complex NOT DETECTED NOT DETECTED   Escherichia coli NOT DETECTED NOT DETECTED   Klebsiella aerogenes NOT DETECTED NOT DETECTED   Klebsiella oxytoca NOT DETECTED NOT DETECTED   Klebsiella pneumoniae NOT DETECTED NOT DETECTED   Proteus species NOT DETECTED NOT DETECTED   Salmonella species NOT DETECTED NOT DETECTED   Serratia marcescens NOT DETECTED NOT DETECTED   Haemophilus influenzae NOT DETECTED NOT DETECTED   Neisseria meningitidis NOT DETECTED NOT  DETECTED   Pseudomonas aeruginosa NOT DETECTED NOT DETECTED   Stenotrophomonas maltophilia NOT DETECTED NOT DETECTED   Candida albicans NOT DETECTED NOT DETECTED   Candida auris NOT DETECTED NOT DETECTED   Candida glabrata NOT DETECTED NOT DETECTED   Candida krusei NOT DETECTED NOT DETECTED   Candida parapsilosis NOT DETECTED NOT DETECTED   Candida tropicalis NOT DETECTED NOT DETECTED   Cryptococcus neoformans/gattii NOT DETECTED NOT DETECTED    Cruzita Lipa D. Laney Potash, PharmD, BCPS, BCCCP 10/17/2022, 9:23 PM

## 2022-10-17 NOTE — ED Notes (Signed)
ED TO INPATIENT HANDOFF REPORT  ED Nurse Name and Phone #:   S Name/Age/Gender Aaron Gibbs 88 y.o. male Room/Bed: 037C/037C  Code Status   Code Status: DNR  Home/SNF/Other Home Patient oriented to: self Is this baseline? Yes   Triage Complete: Triage complete  Chief Complaint AKI (acute kidney injury) (HCC) [N17.9]  Triage Note Pt to ED via EMS from home. Pt has had recent falls. Pt states he was supposed to have CT done today but was unable to d/t back pain from recent falls. Pt in c-collar upon arrival to ED. Pt states he fell earlier tonight, called EMS but did not come to hospital. Pt had 2nd fall tonight and called EMS again. Pt confused at baseline. Pt AAOx3. Fall unwitnessed. No blood thinners. No LOC reported. Pt has abrasion to left forehead.   EMS Vitals: 125/70 96% RA 20 RR 120 HR    Allergies Allergies  Allergen Reactions   Anthralin Shortness Of Breath   Azithromycin Shortness Of Breath    Level of Care/Admitting Diagnosis ED Disposition     ED Disposition  Admit   Condition  --   Comment  Hospital [facility]: [facility] [[id number]]  Level of Care: Med-Surg [16]  May place patient in observation at [facility] or [facility] if equivalent level of care is available:: No  Covid Evaluation: Confirmed COVID Positive  Diagnosis: AKI (acute kidney injury) [facility]) [[id number]]  Admitting Physician: Imogene Burn, ERIC [[id number]]  Attending Physician: Imogene Burn, ERIC [[id number]]          B Medical/Surgery History Past Medical History:  Diagnosis Date   Anxiety    Cancer (HCC)    skin   COPD (chronic obstructive pulmonary disease) (HCC)    Glaucoma    Osteoporosis    osteopenia   Suicide attempt (HCC) (05/13/18) 06/26/2018   Past Surgical History:  Procedure Laterality Date   CHOLECYSTECTOMY     TRANSURETHRAL RESECTION OF PROSTATE N/A 2008     A IV Location/Drains/Wounds Patient Lines/Drains/Airways Status     Active Line/Drains/Airways      Name Placement date Placement time Site Days   Peripheral IV 10/17/22 20 G Anterior;Distal;Left;Upper Arm 10/17/22  0140  Arm  less than 1   External Urinary Catheter 10/14/22  2255  --  3            Intake/Output Last 24 hours  Intake/Output Summary (Last 24 hours) at 10/17/2022 0911 Last data filed at 10/17/2022 0602 Gross per 24 hour  Intake 2187.24 ml  Output --  Net 2187.24 ml    Labs/Imaging Results for orders placed or performed during the hospital encounter of 10/17/22 (from the past 48 hour(s))  CBC with Differential     Status: Abnormal   Collection Time: 10/17/22  1:41 AM  Result Value Ref Range   WBC 11.6 (H) 4.0 - 10.5 K/uL   RBC 3.86 (L) 4.22 - 5.81 MIL/uL   Hemoglobin 11.4 (L) 13.0 - 17.0 g/dL   HCT 04.5 (L) 40.9 - 81.1 %   MCV 94.3 80.0 - 100.0 fL   MCH 29.5 26.0 - 34.0 pg   MCHC 31.3 30.0 - 36.0 g/dL   RDW 91.4 (H) 78.2 - 95.6 %   Platelets 201 150 - 400 K/uL   nRBC 0.0 0.0 - 0.2 %   Neutrophils Relative % 53 %   Neutro Abs 6.2 1.7 - 7.7 K/uL   Lymphocytes Relative 16 %   Lymphs Abs 1.9 0.7 - 4.0 K/uL  Monocytes Relative 28 %   Monocytes Absolute 3.2 (H) 0.1 - 1.0 K/uL   Eosinophils Relative 1 %   Eosinophils Absolute 0.1 0.0 - 0.5 K/uL   Basophils Relative 0 %   Basophils Absolute 0.1 0.0 - 0.1 K/uL   Immature Granulocytes 2 %   Abs Immature Granulocytes 0.22 (H) 0.00 - 0.07 K/uL    Comment: Performed at [facility], [address], [place], [place] [address]  Comprehensive metabolic panel     Status: Abnormal   Collection Time: 10/17/22  1:41 AM  Result Value Ref Range   Sodium 143 135 - 145 mmol/L   Potassium 3.1 (L) 3.5 - 5.1 mmol/L   Chloride 108 98 - 111 mmol/L   CO2 22 22 - 32 mmol/L   Glucose, Bld 138 (H) 70 - 99 mg/dL    Comment: Glucose reference range applies only to samples taken after fasting for at least 8 hours.   BUN 45 (H) 8 - 23 mg/dL   Creatinine, Ser 1.91 (H) 0.61 - 1.24 mg/dL   Calcium 9.3 8.9 - 47.8 mg/dL   Total  Protein 6.9 6.5 - 8.1 g/dL   Albumin 3.7 3.5 - 5.0 g/dL   AST 39 15 - 41 U/L   ALT 40 0 - 44 U/L   Alkaline Phosphatase 54 38 - 126 U/L   Total Bilirubin 1.4 (H) 0.3 - 1.2 mg/dL   GFR, Estimated 34 (L) >60 mL/min    Comment: (NOTE) Calculated using the CKD-EPI Creatinine Equation (2021)    Anion gap 13 5 - 15    Comment: Performed at [facility], [address] [place], [place] [address]  Lactic acid, plasma     Status: Abnormal   Collection Time: 10/17/22  1:41 AM  Result Value Ref Range   Lactic Acid, Venous 4.6 (HH) 0.5 - 1.9 mmol/L    Comment: CRITICAL RESULT CALLED TO, READ BACK BY AND VERIFIED WITH A. NEWTON RN 10/17/22 @0228  BY J. WHITE Performed at [facility], [address] [place], [place] [address]   Protime-INR     Status: None   Collection Time: 10/17/22  1:41 AM  Result Value Ref Range   Prothrombin Time 14.5 11.4 - 15.2 seconds   INR 1.1 0.8 - 1.2    Comment: (NOTE) INR goal varies based on device and disease states. Performed at [facility], [address] [place], [place] [address]   APTT     Status: Abnormal   Collection Time: 10/17/22  1:41 AM  Result Value Ref Range   aPTT 23 (L) 24 - 36 seconds    Comment: Performed at [facility], [address], [place], [place] [address]  Type and screen [facility]     Status: None   Collection Time: 10/17/22  1:45 AM  Result Value Ref Range   ABO/RH(D) O POS    Antibody Screen NEG    Sample Expiration      10/20/2022,2359 Performed at [facility], [address] [place], [place] [address]   Urinalysis, w/ Reflex to Culture (Infection Suspected) -Urine, Catheterized     Status: Abnormal   Collection Time: 10/17/22  1:53 AM  Result Value Ref Range   Specimen Source URINE, CATHETERIZED    Color, Urine AMBER (A) YELLOW    Comment: BIOCHEMICALS MAY BE AFFECTED BY COLOR   APPearance HAZY (A) CLEAR   Specific Gravity, Urine 1.026 1.005 -  1.030   pH 5.0 5.0 -  8.0   Glucose, UA NEGATIVE NEGATIVE mg/dL   Hgb urine dipstick NEGATIVE NEGATIVE   Bilirubin Urine NEGATIVE NEGATIVE   Ketones, ur NEGATIVE NEGATIVE mg/dL   Protein, ur 161 (A) NEGATIVE mg/dL   Nitrite NEGATIVE NEGATIVE   Leukocytes,Ua NEGATIVE NEGATIVE   RBC / HPF 0-5 0 - 5 RBC/hpf   WBC, UA 0-5 0 - 5 WBC/hpf    Comment:        Reflex urine culture not performed if WBC <=10, OR if Squamous epithelial cells >5. If Squamous epithelial cells >5 suggest recollection.    Bacteria, UA RARE (A) NONE SEEN   Squamous Epithelial / HPF 0-5 0 - 5 /HPF   Mucus PRESENT     Comment: Performed at [facility], [address] [place], [place] [address]  POC occult blood, ED     Status: Abnormal   Collection Time: 10/17/22  2:16 AM  Result Value Ref Range   Fecal Occult Bld POSITIVE (A) NEGATIVE  Resp panel by RT-PCR (RSV, Flu A&B, Covid) Anterior Nasal Swab     Status: Abnormal   Collection Time: 10/17/22  2:27 AM   Specimen: Anterior Nasal Swab  Result Value Ref Range   SARS Coronavirus 2 by RT PCR POSITIVE (A) NEGATIVE   Influenza A by PCR NEGATIVE NEGATIVE   Influenza B by PCR NEGATIVE NEGATIVE    Comment: (NOTE) The Xpert Xpress SARS-CoV-2/FLU/RSV plus assay is intended as an aid in the diagnosis of influenza from Nasopharyngeal swab specimens and should not be used as a sole basis for treatment. Nasal washings and aspirates are unacceptable for Xpert Xpress SARS-CoV-2/FLU/RSV testing.  Fact Sheet for Patients: BloggerCourse.com  Fact Sheet for Healthcare Providers: SeriousBroker.it  This test is not yet approved or cleared by the [place] FDA and has been authorized for detection and/or diagnosis of SARS-CoV-2 by FDA under an Emergency Use Authorization (EUA). This EUA will remain in effect (meaning this test can be used) for the duration of the COVID-19 declaration under Section 564(b)(1) of the Act, 21 U.S.C. section  360bbb-3(b)(1), unless the authorization is terminated or revoked.     Resp Syncytial Virus by PCR NEGATIVE NEGATIVE    Comment: (NOTE) Fact Sheet for Patients: BloggerCourse.com  Fact Sheet for Healthcare Providers: SeriousBroker.it  This test is not yet approved or cleared by the [place] FDA and has been authorized for detection and/or diagnosis of SARS-CoV-2 by FDA under an Emergency Use Authorization (EUA). This EUA will remain in effect (meaning this test can be used) for the duration of the COVID-19 declaration under Section 564(b)(1) of the Act, 21 U.S.C. section 360bbb-3(b)(1), unless the authorization is terminated or revoked.  Performed at [facility], [address], [place], [place] [address]   I-Stat venous blood gas, [facility], [facility], [facility])     Status: Abnormal   Collection Time: 10/17/22  2:32 AM  Result Value Ref Range   pH, Ven 7.361 7.25 - 7.43   pCO2, Ven 38.0 (L) 44 - 60 mmHg   pO2, Ven 25 (LL) 32 - 45 mmHg   Bicarbonate 21.5 20.0 - 28.0 mmol/L   TCO2 23 22 - 32 mmol/L   O2 Saturation 44 %   Acid-base deficit 4.0 (H) 0.0 - 2.0 mmol/L   Sodium 144 135 - 145 mmol/L   Potassium 3.4 (L) 3.5 - 5.1 mmol/L   Calcium, Ion 1.22 1.15 - 1.40 mmol/L   HCT  30.0 (L) 39.0 - 52.0 %   Hemoglobin 10.2 (L) 13.0 - 17.0 g/dL   Sample type VENOUS    Comment NOTIFIED PHYSICIAN   CBG monitoring, ED     Status: Abnormal   Collection Time: 10/17/22  2:40 AM  Result Value Ref Range   Glucose-Capillary 113 (H) 70 - 99 mg/dL    Comment: Glucose reference range applies only to samples taken after fasting for at least 8 hours.  Lactic acid, plasma     Status: Abnormal   Collection Time: 10/17/22  5:10 AM  Result Value Ref Range   Lactic Acid, Venous 2.7 (HH) 0.5 - 1.9 mmol/L    Comment: CRITICAL VALUE NOTED. VALUE IS CONSISTENT WITH PREVIOUSLY REPORTED/CALLED VALUE Performed at [facility], [address]  [place], [place] [address]   Procalcitonin     Status: None   Collection Time: 10/17/22  5:10 AM  Result Value Ref Range   Procalcitonin <0.10 ng/mL    Comment:        Interpretation: PCT (Procalcitonin) <= 0.5 ng/mL: Systemic infection (sepsis) is not likely. Local bacterial infection is possible. (NOTE)       Sepsis PCT Algorithm           Lower Respiratory Tract                                      Infection PCT Algorithm    ----------------------------     ----------------------------         PCT < 0.25 ng/mL                PCT < 0.10 ng/mL          Strongly encourage             Strongly discourage   discontinuation of antibiotics    initiation of antibiotics    ----------------------------     -----------------------------       PCT 0.25 - 0.50 ng/mL            PCT 0.10 - 0.25 ng/mL               OR       >80% decrease in PCT            Discourage initiation of                                            antibiotics      Encourage discontinuation           of antibiotics    ----------------------------     -----------------------------         PCT >= 0.50 ng/mL              PCT 0.26 - 0.50 ng/mL               AND        <80% decrease in PCT             Encourage initiation of                                             antibiotics  Encourage continuation           of antibiotics    ----------------------------     -----------------------------        PCT >= 0.50 ng/mL                  PCT > 0.50 ng/mL               AND         increase in PCT                  Strongly encourage                                      initiation of antibiotics    Strongly encourage escalation           of antibiotics                                     -----------------------------                                           PCT <= 0.25 ng/mL                                                 OR                                        > 80% decrease in PCT                                       Discontinue / Do not initiate                                             antibiotics  Performed at [facility], [address] [place], [place] [address]    CT Lumbar Spine Wo Contrast  Result Date: 10/17/2022 CLINICAL DATA:  88 year old male with recent falls. Spinal ankylosis. EXAM: CT LUMBAR SPINE WITH CONTRAST TECHNIQUE: Technique: Multiplanar CT images of the lumbar spine were reconstructed from contemporary CT of the Abdomen and Pelvis. RADIATION DOSE REDUCTION: This exam was performed according to the departmental dose-optimization program which includes automated exposure control, adjustment of the mA and/or kV according to patient size and/or use of iterative reconstruction technique. CONTRAST:  No additional COMPARISON:  CT Abdomen and Pelvis today reported separately. Thoracic and lumbar spine CT 10/10/2022. FINDINGS: Segmentation: Normal on the comparison. There is a vestigial S1-S2 disc space. Alignment: Stable lumbar lordosis. No significant lumbar scoliosis or spondylolisthesis. Vertebrae: Partially visible diffuse spinal, bilateral SI joint ankylosis and bilateral lower rib costovertebral as noted on the recent comparisons. Associated osteopenia. Maintained lower thoracic and lumbar vertebral height and alignment. No acute osseous abnormality identified. Paraspinal and other soft tissues:  Lower chest, abdomen and pelvis viscera are detailed separately. Paraspinal soft tissues remarkable for generalized paraspinal muscle atrophy. Also T12 level left paraspinal hematoma or contusion partially visible on series 3, image 39 and better demonstrated on series 4, image 15 of the CT Abdomen and Pelvis today. This measures about 7 cm long axis, situated in the atrophied paraspinal muscle and subcutaneous layer. No nearby posterior left lower rib fracture is identified. Disc levels: Diffuse spinal ankylosis. No CT evidence of lumbar spinal stenosis. IMPRESSION: 1. Diffuse spinal ankylosis  (ankylosing spondylitis) with no acute osseous abnormality in the Lumbar Spine. 2. Left paraspinal hematoma (7 cm) at the T12 vertebral level. 3. CT Abdomen and Pelvis today reported separately. Electronically Signed   By: Odessa Fleming M.D.   On: 10/17/2022 04:46   CT ABDOMEN PELVIS W CONTRAST  Result Date: 10/17/2022 CLINICAL DATA:  Frequent falls. Most recently yesterday. Abdominal and back pain secondary to falls. EXAM: CT ABDOMEN AND PELVIS WITH CONTRAST TECHNIQUE: Multidetector CT imaging of the abdomen and pelvis was performed using the standard protocol following bolus administration of intravenous contrast. RADIATION DOSE REDUCTION: This exam was performed according to the departmental dose-optimization program which includes automated exposure control, adjustment of the mA and/or kV according to patient size and/or use of iterative reconstruction technique. CONTRAST:  75mL OMNIPAQUE IOHEXOL 350 MG/ML SOLN COMPARISON:  Chest, abdomen and pelvis CT with IV contrast 1 week ago 10/10/2022. FINDINGS: Technical note: Patient could not lie flat and was scanned right lateral decubitus. The patient's arms were also in the field creating streak artifact. Lower chest: There are positional atelectatic changes on the right, linear scarring or atelectasis in the lingula. Lung bases are clear of infiltrate. The cardiac size is normal. Hepatobiliary: Not as well seen as previously due to artifact from breathing motion and the patient's arms in the field. No mass or laceration is seen through the artifacts. The liver is mildly steatotic. Old cholecystectomy again noted with extrahepatic and central intrahepatic biliary prominence, as before, with the common bile duct again measuring 13 mm. No ductal filling defect is seen. Pancreas: Moderately atrophic.  No other visible abnormality. Spleen: No mass or laceration is seen through the motion and streak artifact. Adrenals/Urinary Tract: There is no adrenal mass. Again noted is  a 1.4 cm heterogeneous indeterminate lesion of the dorsal superior pole right kidney, better seen on the prior study, slightly smaller simple cyst more laterally in the upper pole. No other focal renal abnormality is seen. Renal mass protocol MRI is recommended when clinically feasible. There is no urinary stone or obstruction, no bladder thickening. Stomach/Bowel: No dilatation or wall thickening, including the appendix. There is colonic diverticulosis without evidence of diverticulitis. Vascular/Lymphatic: There is a partially thrombosed fusiform infrarenal AAA again measuring 5 x 4.3 cm. There is aortoiliac atherosclerosis. No adenopathy is seen. Reproductive: Prostate is unremarkable. Other: No abdominal wall hernia or focal abnormality. No abdominopelvic ascites. There is moderate abdominal laxity likely obesity related. Musculoskeletal: Diffuse osteopenia. There are diffuse bridging syndesmophytes of the visualized lower thoracic and lumbar spine. There is bony fusion across the distal spinous processes from the lower thoracic spine down. The SI joints are fused. There is moderate bilateral hip DJD. No acute or other significant osseous findings. IMPRESSION: 1. No acute trauma related findings in the abdomen or pelvis. Image quality degraded by motion and streak artifact in the abdomen. 2. 5 x 4.3 cm partially thrombosed fusiform infrarenal AAA. Recommend follow-up every 6 months and vascular consultation.  Reference: J Am Coll Radiol 2013;10:789-794. 3. 1.4 cm heterogeneous indeterminate lesion of the dorsal superior pole right kidney, better seen on the prior study. Renal mass protocol MRI is recommended when clinically feasible. 4. Diverticulosis without evidence of diverticulitis. 5. Aortic atherosclerosis. 6. Diffuse spinal bridging syndesmophytes with bony bridging across the distal spinous processes. Bilaterally fused SI joints. Aortic Atherosclerosis (ICD10-I70.0). Electronically Signed   By: Almira Bar M.D.   On: 10/17/2022 04:18   CT HEAD WO CONTRAST ( )  Result Date: 10/17/2022 CLINICAL DATA:  Fall EXAM: CT HEAD WITHOUT CONTRAST CT CERVICAL SPINE WITHOUT CONTRAST TECHNIQUE: Multidetector CT imaging of the head and cervical spine was performed following the standard protocol without intravenous contrast. Multiplanar CT image reconstructions of the cervical spine were also generated. RADIATION DOSE REDUCTION: This exam was performed according to the departmental dose-optimization program which includes automated exposure control, adjustment of the mA and/or kV according to patient size and/or use of iterative reconstruction technique. COMPARISON:  10/10/2022 FINDINGS: CT HEAD FINDINGS Brain: There is no mass, hemorrhage or extra-axial collection. The size and configuration of the ventricles and extra-axial CSF spaces are normal. There is hypoattenuation of the periventricular white matter, most commonly indicating chronic ischemic microangiopathy. Vascular: No abnormal hyperdensity of the major intracranial arteries or dural venous sinuses. No intracranial atherosclerosis. Skull: The visualized skull base, calvarium and extracranial soft tissues are normal. Sinuses/Orbits: No fluid levels or advanced mucosal thickening of the visualized paranasal sinuses. No mastoid or middle ear effusion. The orbits are normal. CT CERVICAL SPINE FINDINGS Alignment: No static subluxation. Facets are aligned. Occipital condyles are normally positioned. Skull base and vertebrae: Minimally displaced fracture of C2, at the base of the odontoid. Fracture extends to the right lateral mass. (AO Spine C2 type A). No other acute fracture. There is diffuse ankylosis. Soft tissues and spinal canal: No prevertebral fluid or swelling. No visible canal hematoma. Disc levels: No advanced spinal canal or neural foraminal stenosis. Upper chest: No pneumothorax, pulmonary nodule or pleural effusion. Other: Normal visualized paraspinal  cervical soft tissues. IMPRESSION: 1. No acute intracranial abnormality. 2. Minimally displaced fracture of C2, at the base of the odontoid. Fracture extends to the right lateral mass (AO Spine C2 type A). 3. Consider CTA neck for assessment of the right vertebral artery since the C2 fracture affects the right transverse foramen. These results were called by telephone at the time of interpretation on 10/17/2022 at 3:55 am to provider Roxy Horseman , who verbally acknowledged these results. Electronically Signed   By: Deatra Robinson M.D.   On: 10/17/2022 03:56   CT Cervical Spine Wo Contrast  Result Date: 10/17/2022 CLINICAL DATA:  Fall EXAM: CT HEAD WITHOUT CONTRAST CT CERVICAL SPINE WITHOUT CONTRAST TECHNIQUE: Multidetector CT imaging of the head and cervical spine was performed following the standard protocol without intravenous contrast. Multiplanar CT image reconstructions of the cervical spine were also generated. RADIATION DOSE REDUCTION: This exam was performed according to the departmental dose-optimization program which includes automated exposure control, adjustment of the mA and/or kV according to patient size and/or use of iterative reconstruction technique. COMPARISON:  10/10/2022 FINDINGS: CT HEAD FINDINGS Brain: There is no mass, hemorrhage or extra-axial collection. The size and configuration of the ventricles and extra-axial CSF spaces are normal. There is hypoattenuation of the periventricular white matter, most commonly indicating chronic ischemic microangiopathy. Vascular: No abnormal hyperdensity of the major intracranial arteries or dural venous sinuses. No intracranial atherosclerosis. Skull: The visualized skull base, calvarium and extracranial soft  tissues are normal. Sinuses/Orbits: No fluid levels or advanced mucosal thickening of the visualized paranasal sinuses. No mastoid or middle ear effusion. The orbits are normal. CT CERVICAL SPINE FINDINGS Alignment: No static subluxation.  Facets are aligned. Occipital condyles are normally positioned. Skull base and vertebrae: Minimally displaced fracture of C2, at the base of the odontoid. Fracture extends to the right lateral mass. (AO Spine C2 type A). No other acute fracture. There is diffuse ankylosis. Soft tissues and spinal canal: No prevertebral fluid or swelling. No visible canal hematoma. Disc levels: No advanced spinal canal or neural foraminal stenosis. Upper chest: No pneumothorax, pulmonary nodule or pleural effusion. Other: Normal visualized paraspinal cervical soft tissues. IMPRESSION: 1. No acute intracranial abnormality. 2. Minimally displaced fracture of C2, at the base of the odontoid. Fracture extends to the right lateral mass (AO Spine C2 type A). 3. Consider CTA neck for assessment of the right vertebral artery since the C2 fracture affects the right transverse foramen. These results were called by telephone at the time of interpretation on 10/17/2022 at 3:55 am to provider Roxy Horseman , who verbally acknowledged these results. Electronically Signed   By: Deatra Robinson M.D.   On: 10/17/2022 03:56   DG Pelvis Portable  Result Date: 10/17/2022 CLINICAL DATA:  Recent fall with pelvic pain, initial encounter EXAM: PORTABLE PELVIS 1-2 VIEWS COMPARISON:  10/10/2022 FINDINGS: Degenerative changes of the hip joints are noted bilaterally. Pelvic ring is rotated to the right although no acute fracture is seen. No soft tissue abnormality is noted. IMPRESSION: No acute abnormality noted. Electronically Signed   By: Alcide Clever M.D.   On: 10/17/2022 03:03   DG Chest Port 1 View  Result Date: 10/17/2022 CLINICAL DATA:  Recent fall with chest pain, initial encounter EXAM: PORTABLE CHEST 1 VIEW COMPARISON:  06/16/2022 FINDINGS: Cardiac shadow is within normal limits. Patient is significantly rotated to the right accentuating the mediastinal markings. The lungs are clear. No acute bony abnormality is noted. IMPRESSION: No acute  abnormality noted. Electronically Signed   By: Alcide Clever M.D.   On: 10/17/2022 03:02    Pending Labs Unresulted Labs (From admission, onward)     Start     Ordered   10/18/22 0500  Comprehensive metabolic panel  Tomorrow morning,   R        10/17/22 0537   10/18/22 0500  Magnesium  Tomorrow morning,   R        10/17/22 0537   10/18/22 0500  CBC with Differential/Platelet  Tomorrow morning,   R        10/17/22 0537   10/17/22 0146  Blood Culture (routine x 2)  (Undifferentiated presentation (screening labs and basic nursing orders))  BLOOD CULTURE X 2,   STAT      10/17/22 0146            Vitals/Pain Today's Vitals   10/17/22 0746 10/17/22 0815 10/17/22 0830 10/17/22 0837  BP:  110/68 125/76   Pulse:  (!) 107 82   Resp:  18 20   Temp:      TempSrc:      SpO2:  94% 99%   Weight:      Height:      PainSc: Asleep   0-No pain    Isolation Precautions Droplet and Contact precautions  Medications Medications  acetaminophen (TYLENOL) suppository 650 mg (has no administration in time range)  lactated ringers infusion (has no administration in time range)  buprenorphine (BUTRANS) 10 MCG/HR  1 patch (has no administration in time range)  carvedilol (COREG) tablet 12.5 mg (has no administration in time range)  OLANZapine (ZYPREXA) tablet 2.5 mg (has no administration in time range)  traZODone (DESYREL) tablet 150 mg (has no administration in time range)  DULoxetine (CYMBALTA) DR capsule 90 mg (has no administration in time range)  finasteride (PROSCAR) tablet 5 mg (has no administration in time range)  tamsulosin (FLOMAX) capsule 0.4 mg (has no administration in time range)  gabapentin (NEURONTIN) capsule 300 mg (has no administration in time range)  acetaminophen (TYLENOL) tablet 650 mg (has no administration in time range)    Or  acetaminophen (TYLENOL) suppository 650 mg (has no administration in time range)  ondansetron (ZOFRAN) tablet 4 mg (has no administration in  time range)    Or  ondansetron (ZOFRAN) injection 4 mg (has no administration in time range)  oxyCODONE (Oxy IR/ROXICODONE) immediate release tablet 5 mg (has no administration in time range)  HYDROmorphone (DILAUDID) injection 0.5-1 mg (1 mg Intravenous Given 10/17/22 0758)  diazepam (VALIUM) injection 5 mg (0 mg Intravenous Hold 10/17/22 0534)  lactated ringers bolus 1,000 mL (1,000 mLs Intravenous New Bag/Given 10/17/22 0217)  LORazepam (ATIVAN) injection 0.5 mg (0.5 mg Intravenous Given 10/17/22 0211)  fentaNYL (SUBLIMAZE) injection 50 mcg (50 mcg Intravenous Given 10/17/22 0214)  ceFEPIme (MAXIPIME) 2 g in sodium chloride 0.9 % 100 mL IVPB (0 g Intravenous Stopped 10/17/22 0535)  metroNIDAZOLE (FLAGYL) IVPB 500 mg (0 mg Intravenous Stopped 10/17/22 0430)  lactated ringers bolus 2,000 mL (0 mLs Intravenous Stopped 10/17/22 0602)  vancomycin (VANCOREADY) IVPB 2000 mg/400 mL (0 mg Intravenous Stopped 10/17/22 0815)  iohexol (OMNIPAQUE) 350 MG/ML injection 75 mL (75 mLs Intravenous Contrast Given 10/17/22 0340)  LORazepam (ATIVAN) injection 0.5 mg (0.5 mg Intravenous Given 10/17/22 0410)  HYDROmorphone (DILAUDID) injection 1 mg (1 mg Intravenous Given 10/17/22 2956)    Mobility non-ambulatory     Focused Assessments Renal Assessment Handoff:  Hemodialysis Schedule:  Last Hemodialysis date and time:    Restricted appendage:    R Recommendations: See Admitting Provider Note  Report given to:   Additional Notes:

## 2022-10-17 NOTE — Progress Notes (Signed)
PROGRESS NOTE    Aaron Gibbs  ZOX:[id number] DOB: Jul 14, 1934 DOA: 10/17/2022 PCP: Smitty Cords, DO    Brief Narrative:  88 year old male with past medical history of blindness, chronic pain on narcotics, COPD, depression, GERD, hypertension who was recently discharged from [facility] yesterday presented to our hospital after sustaining a fall in the middle of the night.  Patient was brought into the hospital by EMS.  He has had 2 falls at home since discharge.  Of note patient was recently admitted to [facility] hospital on 10/11/2022 for COVID-19 infection.  In the ED, patient was mildly febrile, tachycardic.  Initial labs showed creatinine of 1.9.  COVID test was positive.  WBC mildly elevated at 11.6.  CT head was negative for acute abnormality.  CT C-spine showed displaced C2 fracture of the base of   Radiology recommended CTA of the neck for assessment of right vertebral artery.  CT scan of the abdomen and pelvis showed partially thrombosed infrarenal abdominal aortic aneurysm of 5 x 4.3 cm.  Neurosurgery was then consulted and patient was admitted hospital for acute kidney injury, C-spine fracture.    Assessment and Plan:  * AKI (acute kidney injury) Continue IV fluids.  Creatinine of 1.9.  Check BMP in AM.  Continue Ringer lactate 7500/h for 1 day.  C2 cervical fracture  Neurosurgery was consulted.  CT angiogram of the neck showed cervical fracture.    Radiology recommended a CT angiogram of the neck for assessment of vertebral artery.  At this time the neurosurgery has recommended no surgical intervention and soft collar.  COVID-19 infection. Stable on room air.  No need for treatment.  Blood cultures have been sent from the ED.  Procalcitonin negative.  Lactate was elevated at 2.7.  On IV fluids.  Will continue for now.  Temperature max of 101 at this time.   Fall at home, initial encounter Patient with frequent falls.  Palliative care has been  consulted.  Will get PT, OT evaluation.   DNR (do not resuscitate) Presented with a DNR form.  Chronic heart failure with preserved ejection fraction (HCC) Continue Coreg.  Hold Bumex.   COPD (chronic obstructive pulmonary disease) (HCC) Continue bronchodilators as needed.  Currently stable.   Essential hypertension On Coreg twice daily.  Hold Bumex due to AKI.   Chronic pain syndrome On buprenorphine patch 10 mcg/h.  Risk of falls due to pain medication.   Hypokalemia Replenish potassium.  Check levels in AM.  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  Was recently diagnosed at [facility].  Plan was outpatient vascular surgery follow-up/CT MRI in 6 months.  FOBT positive. Hemoglobin of 11.4.  Will closely monitor.  No  obvious blood loss reported to me.  Will check iron profile.    DVT prophylaxis: SCDs Start: 10/17/22 0537   Code Status:     Code Status: DNR  Disposition: Uncertain at this time.  Will get PT evaluation.  Status is: Observation  The patient will require care spanning > 2 midnights and should be moved to inpatient because: C2 fracture, deconditioning debility, fever, need for PT evaluation.   Family Communication:  Spoke with the patient's daughter Ms. [name] on the phone and updated her about the clinical condition of the patient.  Consultants:  Neurosurgery  Procedures:  Cervical collar  Antimicrobials:  None  Anti-infectives (From admission, onward)    Start     Dose/Rate Route  Frequency Ordered Stop   10/17/22 0300  vancomycin (VANCOREADY) IVPB 2000 mg/400 mL        2,000 mg 200 mL/hr over 120 Minutes Intravenous  Once 10/17/22 0249 10/17/22 0815   10/17/22 0245  ceFEPIme (MAXIPIME) 2 g in sodium chloride 0.9 % 100 mL IVPB        2 g 200 mL/hr over 30 Minutes Intravenous  Once 10/17/22 0242 10/17/22 0535   10/17/22 0245  metroNIDAZOLE (FLAGYL) IVPB 500 mg         500 mg 100 mL/hr over 60 Minutes Intravenous  Once 10/17/22 0242 10/17/22 0430   10/17/22 0245  vancomycin (VANCOCIN) IVPB 1000 mg/200 mL premix  Status:  Discontinued        1,000 mg 200 mL/hr over 60 Minutes Intravenous  Once 10/17/22 0242 10/17/22 0249       Subjective: Today, patient was seen and examined at bedside.  Patient complains of neck pain.  Able to move extremities.  Denies any nausea, vomiting, fever, chills or rigor.  Has mild nausea.  States that he has been able to urinate okay bowel movements are right.  Objective: Vitals:   10/17/22 0830 10/17/22 0932 10/17/22 1023 10/17/22 1100  BP: 125/76  (!) 157/80   Pulse: 82  63   Resp: 20  16   Temp:  97.6 F (36.4 C) (!) 97.5 F (36.4 C)   TempSrc:   Oral   SpO2: 99%  94%   Weight:    91.7 kg  Height:    5\' 10"  (1.778 m)    Intake/Output Summary (Last 24 hours) at 10/17/2022 1305 Last data filed at 10/17/2022 0602 Gross per 24 hour  Intake 2187.24 ml  Output --  Net 2187.24 ml   Filed Weights   10/17/22 0230 10/17/22 1100  Weight: 95.3 kg 91.7 kg    Physical Examination: Body mass index is 29.01 kg/m.  General:  Average built, not in obvious distress HENT:   No scleral pallor or icterus noted. Oral mucosa is moist.  Chest:   Diminished breath sounds bilaterally. No crackles or wheezes.  CVS: S1 &S2 heard. No murmur.  Regular rate and rhythm. Abdomen: Soft, nontender, nondistended.  Bowel sounds are heard.   Extremities: No cyanosis, clubbing or edema.  Peripheral pulses are palpable. Psych: Alert, awake and oriented, normal mood CNS:  No cranial nerve deficits.  Moving all extremities. Skin: Warm and dry.  No rashes noted.  Data Reviewed:   CBC: Recent Labs  Lab 10/10/22 1919 10/11/22 1201 10/11/22 2050 10/12/22 1610 10/14/22 9604 10/17/22 0141 10/17/22 0232  WBC 5.0 3.7* 3.8* 3.9* 6.7 11.6*  --   NEUTROABS 2.4 DUPLICATE REQUEST  1.5* 2.3  --  3.2 6.2  --   HGB 13.0 11.9* 12.5* 11.3*  11.1* 11.4* 10.2*  HCT 41.2 37.4* 39.7 35.9* 34.2* 36.4* 30.0*  MCV 92.6 92.8 91.1 92.5 90.0 94.3  --   PLT 102* 95* 110* 99* 144* 201  --     Basic Metabolic Panel: Recent Labs  Lab 10/10/22 1919 10/12/22 0611 10/14/22 0638 10/17/22 0141 10/17/22 0232  NA 141 142 144 143 144  K 4.0 3.6 3.8 3.1* 3.4*  CL 108 112* 111 108  --   CO2 26 21* 24 22  --   GLUCOSE 136* 136* 113* 138*  --   BUN 26* 26* 30* 45*  --   CREATININE 1.22 1.15 1.16 1.90*  --   CALCIUM 8.9 8.7* 9.1 9.3  --  Liver Function Tests: Recent Labs  Lab 10/10/22 1919 10/12/22 0611 10/14/22 0638 10/17/22 0141  AST 25 28 33 39  ALT 12 18 31  40  ALKPHOS 57 47 51 54  BILITOT 0.7 0.6 1.0 1.4*  PROT 6.6 6.4* 7.0 6.9  ALBUMIN 3.7 3.3* 3.6 3.7     Radiology Studies: CT Lumbar Spine Wo Contrast  Result Date: 10/17/2022 CLINICAL DATA:  88 year old male with recent falls. Spinal ankylosis. EXAM: CT LUMBAR SPINE WITH CONTRAST TECHNIQUE: Technique: Multiplanar CT images of the lumbar spine were reconstructed from contemporary CT of the Abdomen and Pelvis. RADIATION DOSE REDUCTION: This exam was performed according to the departmental dose-optimization program which includes automated exposure control, adjustment of the mA and/or kV according to patient size and/or use of iterative reconstruction technique. CONTRAST:  No additional COMPARISON:  CT Abdomen and Pelvis today reported separately. Thoracic and lumbar spine CT 10/10/2022. FINDINGS: Segmentation: Normal on the comparison. There is a vestigial S1-S2 disc space. Alignment: Stable lumbar lordosis. No significant lumbar scoliosis or spondylolisthesis. Vertebrae: Partially visible diffuse spinal, bilateral SI joint ankylosis and bilateral lower rib costovertebral as noted on the recent comparisons. Associated osteopenia. Maintained lower thoracic and lumbar vertebral height and alignment. No acute osseous abnormality identified. Paraspinal and other soft tissues: Lower  chest, abdomen and pelvis viscera are detailed separately. Paraspinal soft tissues remarkable for generalized paraspinal muscle atrophy. Also T12 level left paraspinal hematoma or contusion partially visible on series 3, image 39 and better demonstrated on series 4, image 15 of the CT Abdomen and Pelvis today. This measures about 7 cm long axis, situated in the atrophied paraspinal muscle and subcutaneous layer. No nearby posterior left lower rib fracture is identified. Disc levels: Diffuse spinal ankylosis. No CT evidence of lumbar spinal stenosis. IMPRESSION: 1. Diffuse spinal ankylosis (ankylosing spondylitis) with no acute osseous abnormality in the Lumbar Spine. 2. Left paraspinal hematoma (7 cm) at the T12 vertebral level. 3. CT Abdomen and Pelvis today reported separately. Electronically Signed   By: Odessa Fleming M.D.   On: 10/17/2022 04:46   CT ABDOMEN PELVIS W CONTRAST  Result Date: 10/17/2022 CLINICAL DATA:  Frequent falls. Most recently yesterday. Abdominal and back pain secondary to falls. EXAM: CT ABDOMEN AND PELVIS WITH CONTRAST TECHNIQUE: Multidetector CT imaging of the abdomen and pelvis was performed using the standard protocol following bolus administration of intravenous contrast. RADIATION DOSE REDUCTION: This exam was performed according to the departmental dose-optimization program which includes automated exposure control, adjustment of the mA and/or kV according to patient size and/or use of iterative reconstruction technique. CONTRAST:  75mL OMNIPAQUE IOHEXOL 350 MG/ML SOLN COMPARISON:  Chest, abdomen and pelvis CT with IV contrast 1 week ago 10/10/2022. FINDINGS: Technical note: Patient could not lie flat and was scanned right lateral decubitus. The patient's arms were also in the field creating streak artifact. Lower chest: There are positional atelectatic changes on the right, linear scarring or atelectasis in the lingula. Lung bases are clear of infiltrate. The cardiac size is normal.  Hepatobiliary: Not as well seen as previously due to artifact from breathing motion and the patient's arms in the field. No mass or laceration is seen through the artifacts. The liver is mildly steatotic. Old cholecystectomy again noted with extrahepatic and central intrahepatic biliary prominence, as before, with the common bile duct again measuring 13 mm. No ductal filling defect is seen. Pancreas: Moderately atrophic.  No other visible abnormality. Spleen: No mass or laceration is seen through the motion and streak artifact.  Adrenals/Urinary Tract: There is no adrenal mass. Again noted is a 1.4 cm heterogeneous indeterminate lesion of the dorsal superior pole right kidney, better seen on the prior study, slightly smaller simple cyst more laterally in the upper pole. No other focal renal abnormality is seen. Renal mass protocol MRI is recommended when clinically feasible. There is no urinary stone or obstruction, no bladder thickening. Stomach/Bowel: No dilatation or wall thickening, including the appendix. There is colonic diverticulosis without evidence of diverticulitis. Vascular/Lymphatic: There is a partially thrombosed fusiform infrarenal AAA again measuring 5 x 4.3 cm. There is aortoiliac atherosclerosis. No adenopathy is seen. Reproductive: Prostate is unremarkable. Other: No abdominal wall hernia or focal abnormality. No abdominopelvic ascites. There is moderate abdominal laxity likely obesity related. Musculoskeletal: Diffuse osteopenia. There are diffuse bridging syndesmophytes of the visualized lower thoracic and lumbar spine. There is bony fusion across the distal spinous processes from the lower thoracic spine down. The SI joints are fused. There is moderate bilateral hip DJD. No acute or other significant osseous findings. IMPRESSION: 1. No acute trauma related findings in the abdomen or pelvis. Image quality degraded by motion and streak artifact in the abdomen. 2. 5 x 4.3 cm partially thrombosed  fusiform infrarenal AAA. Recommend follow-up every 6 months and vascular consultation. Reference: J Am Coll Radiol 2013;10:789-794. 3. 1.4 cm heterogeneous indeterminate lesion of the dorsal superior pole right kidney, better seen on the prior study. Renal mass protocol MRI is recommended when clinically feasible. 4. Diverticulosis without evidence of diverticulitis. 5. Aortic atherosclerosis. 6. Diffuse spinal bridging syndesmophytes with bony bridging across the distal spinous processes. Bilaterally fused SI joints. Aortic Atherosclerosis (ICD10-I70.0). Electronically Signed   By: Almira Bar M.D.   On: 10/17/2022 04:18   CT HEAD WO CONTRAST ( )  Result Date: 10/17/2022 CLINICAL DATA:  Fall EXAM: CT HEAD WITHOUT CONTRAST CT CERVICAL SPINE WITHOUT CONTRAST TECHNIQUE: Multidetector CT imaging of the head and cervical spine was performed following the standard protocol without intravenous contrast. Multiplanar CT image reconstructions of the cervical spine were also generated. RADIATION DOSE REDUCTION: This exam was performed according to the departmental dose-optimization program which includes automated exposure control, adjustment of the mA and/or kV according to patient size and/or use of iterative reconstruction technique. COMPARISON:  10/10/2022 FINDINGS: CT HEAD FINDINGS Brain: There is no mass, hemorrhage or extra-axial collection. The size and configuration of the ventricles and extra-axial CSF spaces are normal. There is hypoattenuation of the periventricular white matter, most commonly indicating chronic ischemic microangiopathy. Vascular: No abnormal hyperdensity of the major intracranial arteries or dural venous sinuses. No intracranial atherosclerosis. Skull: The visualized skull base, calvarium and extracranial soft tissues are normal. Sinuses/Orbits: No fluid levels or advanced mucosal thickening of the visualized paranasal sinuses. No mastoid or middle ear effusion. The orbits are normal. CT  CERVICAL SPINE FINDINGS Alignment: No static subluxation. Facets are aligned. Occipital condyles are normally positioned. Skull base and vertebrae: Minimally displaced fracture of C2, at the base of the odontoid. Fracture extends to the right lateral mass. (AO Spine C2 type A). No other acute fracture. There is diffuse ankylosis. Soft tissues and spinal canal: No prevertebral fluid or swelling. No visible canal hematoma. Disc levels: No advanced spinal canal or neural foraminal stenosis. Upper chest: No pneumothorax, pulmonary nodule or pleural effusion. Other: Normal visualized paraspinal cervical soft tissues. IMPRESSION: 1. No acute intracranial abnormality. 2. Minimally displaced fracture of C2, at the base of the odontoid. Fracture extends to the right lateral mass (AO Spine C2 type  A). 3. Consider CTA neck for assessment of the right vertebral artery since the C2 fracture affects the right transverse foramen. These results were called by telephone at the time of interpretation on 10/17/2022 at 3:55 am to provider Roxy Horseman , who verbally acknowledged these results. Electronically Signed   By: Deatra Robinson M.D.   On: 10/17/2022 03:56   CT Cervical Spine Wo Contrast  Result Date: 10/17/2022 CLINICAL DATA:  Fall EXAM: CT HEAD WITHOUT CONTRAST CT CERVICAL SPINE WITHOUT CONTRAST TECHNIQUE: Multidetector CT imaging of the head and cervical spine was performed following the standard protocol without intravenous contrast. Multiplanar CT image reconstructions of the cervical spine were also generated. RADIATION DOSE REDUCTION: This exam was performed according to the departmental dose-optimization program which includes automated exposure control, adjustment of the mA and/or kV according to patient size and/or use of iterative reconstruction technique. COMPARISON:  10/10/2022 FINDINGS: CT HEAD FINDINGS Brain: There is no mass, hemorrhage or extra-axial collection. The size and configuration of the ventricles  and extra-axial CSF spaces are normal. There is hypoattenuation of the periventricular white matter, most commonly indicating chronic ischemic microangiopathy. Vascular: No abnormal hyperdensity of the major intracranial arteries or dural venous sinuses. No intracranial atherosclerosis. Skull: The visualized skull base, calvarium and extracranial soft tissues are normal. Sinuses/Orbits: No fluid levels or advanced mucosal thickening of the visualized paranasal sinuses. No mastoid or middle ear effusion. The orbits are normal. CT CERVICAL SPINE FINDINGS Alignment: No static subluxation. Facets are aligned. Occipital condyles are normally positioned. Skull base and vertebrae: Minimally displaced fracture of C2, at the base of the odontoid. Fracture extends to the right lateral mass. (AO Spine C2 type A). No other acute fracture. There is diffuse ankylosis. Soft tissues and spinal canal: No prevertebral fluid or swelling. No visible canal hematoma. Disc levels: No advanced spinal canal or neural foraminal stenosis. Upper chest: No pneumothorax, pulmonary nodule or pleural effusion. Other: Normal visualized paraspinal cervical soft tissues. IMPRESSION: 1. No acute intracranial abnormality. 2. Minimally displaced fracture of C2, at the base of the odontoid. Fracture extends to the right lateral mass (AO Spine C2 type A). 3. Consider CTA neck for assessment of the right vertebral artery since the C2 fracture affects the right transverse foramen. These results were called by telephone at the time of interpretation on 10/17/2022 at 3:55 am to provider Roxy Horseman , who verbally acknowledged these results. Electronically Signed   By: Deatra Robinson M.D.   On: 10/17/2022 03:56   DG Pelvis Portable  Result Date: 10/17/2022 CLINICAL DATA:  Recent fall with pelvic pain, initial encounter EXAM: PORTABLE PELVIS 1-2 VIEWS COMPARISON:  10/10/2022 FINDINGS: Degenerative changes of the hip joints are noted bilaterally. Pelvic  ring is rotated to the right although no acute fracture is seen. No soft tissue abnormality is noted. IMPRESSION: No acute abnormality noted. Electronically Signed   By: Alcide Clever M.D.   On: 10/17/2022 03:03   DG Chest Port 1 View  Result Date: 10/17/2022 CLINICAL DATA:  Recent fall with chest pain, initial encounter EXAM: PORTABLE CHEST 1 VIEW COMPARISON:  06/16/2022 FINDINGS: Cardiac shadow is within normal limits. Patient is significantly rotated to the right accentuating the mediastinal markings. The lungs are clear. No acute bony abnormality is noted. IMPRESSION: No acute abnormality noted. Electronically Signed   By: Alcide Clever M.D.   On: 10/17/2022 03:02      LOS: 0 days    Joycelyn Das, MD [facility] Available via Epic secure chat 7am-7pm  After these hours, please refer to coverage provider listed on [email or web address] 10/17/2022, 1:05 PM

## 2022-10-17 NOTE — Progress Notes (Signed)
Orthopedic Tech Progress Note Patient Details:  Aaron Gibbs 02/09/1935 [id number]  Called floor twice to 2 if patient has neck collar. No answer  Patient ID: Aaron Gibbs, male   DOB: 1934-09-13, 88 y.o.   MRN: [id number]  [name] 10/17/2022, 3:22 PM

## 2022-10-17 NOTE — Evaluation (Signed)
Occupational Therapy Evaluation Patient Details Name: Aaron Gibbs MRN: [id number] DOB: 1934/08/04 Today's Date: 10/17/2022   History of Present Illness 88 year old male who presented after a fall, found to have C2 fx. PMHx: blindness, chronic pain on narcotics, COPD, depression, GERD, hypertension, recently discharged from [facility] hospital (admitted for fall, found to have COVID)   Clinical Impression   [name] was evaluated s/p the above admission list. He requires 24/7 assist from a caregiver at baseline, he is blind and uses DME. Upon evaluation he was limited by lethargy, weakness, neck pain, blindness, impaired cognition, weakness and decreased activity tolerance. Overall he needed mod A for bed mobility and mod A to stand at the EOB. Pt only tolerated 2 side steps at the EOB prior to requesting to return back to bed. Due to the deficits listed below he also needs up to mod A for UB ADLs and max A for LB ADLs. Pt will benefit from continued acute OT services and skilled inpatient follow up therapy, <3 hours/day.       Recommendations for follow up therapy are one component of a multi-disciplinary discharge planning process, led by the attending physician.  Recommendations may be updated based on patient status, additional functional criteria and insurance authorization.   Assistance Recommended at Discharge Frequent or constant Supervision/Assistance  Patient can return home with the following A lot of help with walking and/or transfers;A lot of help with bathing/dressing/bathroom;Direct supervision/assist for medications management;Direct supervision/assist for financial management;Assist for transportation;Help with stairs or ramp for entrance;Assistance with cooking/housework;Assistance with feeding    Functional Status Assessment  Patient has had a recent decline in their functional status and demonstrates the ability to make significant improvements in function in a  reasonable and predictable amount of time.  Equipment Recommendations  Other (comment) (defer)       Precautions / Restrictions Precautions Precautions: Fall;Cervical Precaution Booklet Issued: No Precaution Comments: blind, COVID Required Braces or Orthoses: Cervical Brace Cervical Brace: At all times (orders state soft collar, pt has hard collar donned) Restrictions Weight Bearing Restrictions: No      Mobility Bed Mobility Overal bed mobility: Needs Assistance Bed Mobility: Rolling, Sidelying to Sit, Sit to Sidelying Rolling: Min assist Sidelying to sit: Min assist     Sit to sidelying: Mod assist General bed mobility comments: min A to faciliate log roll, mod A to get BLE back into bed    Transfers Overall transfer level: Needs assistance Equipment used: 1 person hand held assist Transfers: Sit to/from Stand Sit to Stand: Mod assist           General transfer comment: mod A to stand and take lateral steps      Balance Overall balance assessment: Needs assistance Sitting-balance support: Feet supported Sitting balance-Leahy Scale: Fair     Standing balance support: Single extremity supported Standing balance-Leahy Scale: Poor                             ADL either performed or assessed with clinical judgement   ADL Overall ADL's : Needs assistance/impaired Eating/Feeding: Maximal assistance;Sitting   Grooming: Minimal assistance;Sitting   Upper Body Bathing: Moderate assistance;Sitting   Lower Body Bathing: Maximal assistance;+2 for safety/equipment;Sit to/from stand   Upper Body Dressing : Moderate assistance;Sitting   Lower Body Dressing: Maximal assistance;+2 for safety/equipment;Sit to/from stand   Toilet Transfer: Moderate assistance;Stand-pivot;BSC/3in1   Toileting- Clothing Manipulation and Hygiene: Maximal assistance;+2 for safety/equipment;Sit to/from stand  Functional mobility during ADLs: Moderate assistance;+2 for  safety/equipment General ADL Comments: pt only able to tolerate standing and side stepping at EOB     Vision Baseline Vision/History: 2 Legally blind Additional Comments: blind at baseline     Perception Perception Perception Tested?: No   Praxis Praxis Praxis tested?: Not tested    Pertinent Vitals/Pain Pain Assessment Pain Assessment: Faces Faces Pain Scale: Hurts even more Pain Location: neck, abdomen Pain Descriptors / Indicators: Aching, Discomfort, Sore Pain Intervention(s): Limited activity within patient's tolerance, Monitored during session     Hand Dominance Right   Extremity/Trunk Assessment Upper Extremity Assessment Upper Extremity Assessment: Generalized weakness   Lower Extremity Assessment Lower Extremity Assessment: Defer to PT evaluation   Cervical / Trunk Assessment Cervical / Trunk Assessment: Kyphotic   Communication Communication Communication: HOH   Cognition Arousal/Alertness: Lethargic Behavior During Therapy: Flat affect, Impulsive Overall Cognitive Status: Impaired/Different from baseline Area of Impairment: Attention, Safety/judgement, Awareness, Problem solving                   Current Attention Level: Sustained     Safety/Judgement: Decreased awareness of safety Awareness: Intellectual Problem Solving: Slow processing, Decreased initiation, Requires verbal cues General Comments: oriented x3. lethargic but easily rouses to voice, follows simple 1 step commands     General Comments  VSS on RA, increased RR noted     Home Living Family/patient expects to be discharged to:: Private residence Living Arrangements: Other (Comment) Available Help at Discharge: Personal care attendant;Available 24 hours/day Type of Home: House Home Access: Stairs to enter [employer] of Steps: 2 Entrance Stairs-Rails: Right;Left;Can reach both Home Layout: One level     Bathroom Shower/Tub: Producer, television/film/video:  Standard     Home Equipment: Agricultural consultant (2 wheels);Transport chair;Shower seat   Additional Comments: gleaned from chart, pt unable to report      Prior Functioning/Environment Prior Level of Function : Needs assist             Mobility Comments: RW and guided by caregiver, multiple falls ADLs Comments: caregiver assists with all aspects due to low vision        OT Problem List: Decreased strength;Decreased range of motion;Impaired balance (sitting and/or standing);Decreased activity tolerance;Decreased safety awareness;Decreased knowledge of precautions;Impaired vision/perception;Decreased cognition      OT Treatment/Interventions: Therapeutic exercise;Self-care/ADL training;DME and/or AE instruction;Energy conservation;Therapeutic activities;Visual/perceptual remediation/compensation;Patient/family education;Balance training    OT Goals(Current goals can be found in the care plan section) Acute Rehab OT Goals Patient Stated Goal: to sleep OT Goal Formulation: With patient Time For Goal Achievement: 10/31/22 Potential to Achieve Goals: Fair ADL Goals Pt Will Perform Grooming: with set-up;sitting Pt Will Perform Upper Body Dressing: with set-up;sitting Pt Will Perform Lower Body Dressing: with min assist;sit to/from stand Pt Will Transfer to Toilet: with min assist;stand pivot transfer;bedside commode Additional ADL Goal #1: Pt will inep recall cervical precautions  OT Frequency: Min 2X/week       AM-PAC OT "6 Clicks" Daily Activity     Outcome Measure Help from another person eating meals?: A Lot Help from another person taking care of personal grooming?: A Little Help from another person toileting, which includes using toliet, bedpan, or urinal?: A Lot Help from another person bathing (including washing, rinsing, drying)?: A Lot Help from another person to put on and taking off regular upper body clothing?: A Lot Help from another person to put on and taking off  regular lower body clothing?: A Lot  6 Click Score: 13   End of Session Equipment Utilized During Treatment: Gait belt Nurse Communication: Mobility status  Activity Tolerance: Patient limited by pain;Patient limited by fatigue Patient left: in bed;with call bell/phone within reach;with bed alarm set;with family/visitor present  OT Visit Diagnosis: Unsteadiness on feet (R26.81);Other abnormalities of gait and mobility (R26.89);Repeated falls (R29.6);Muscle weakness (generalized) (M62.81);History of falling (Z91.81)                Time: 1610-9604 OT Time Calculation (min): 21 min Charges:  OT General Charges $OT Visit: 1 Visit OT Evaluation $OT Eval Moderate Complexity: 1 Mod  Derenda Mis, OTR/L Acute Rehabilitation Services Office [phone] Secure Chat Communication Preferred   Donia Pounds 10/17/2022, 2:21 PM

## 2022-10-17 NOTE — Assessment & Plan Note (Signed)
Admit to observation medical bed.  Continue IV fluids.  Hold nephrotoxic agents.

## 2022-10-17 NOTE — Progress Notes (Signed)
Orthopedic Tech Progress Note Patient Details:  Aaron Gibbs 05-19-1935 [id number]  Dropped off SOFT COLLAR, patient still had on his [place] J COLLAR, so I let the RN know when they go into patient room to remove the HARD COLLAR and apply the soft one. I notified her that we do no remove collars   Ortho Devices Type of Ortho Device: Soft collar Ortho Device/Splint Location: NECK Ortho Device/Splint Interventions: Ordered   Post Interventions Patient Tolerated: Other (comment) Instructions Provided: Other (comment)  [name] 10/17/2022, 3:46 PM

## 2022-10-17 NOTE — ED Notes (Signed)
Patient transported to CT 

## 2022-10-17 NOTE — ED Provider Notes (Signed)
[facility]ENCY DEPT [facility] Emergency Department Provider Note MRN:  [id number]  Arrival date & time: 10/17/22     Chief Complaint   Fall   History of Present Illness   Aaron Gibbs is a 88 y.o. year-old male presents to the ED with chief complaint of fall.  Reports recurrent falls.  Complains of low back pain.  Was just released from the hospital after having been admitted for COVID and COPD exacerbation.  He is not anticoagulated.  He states that he is very thirsty and hasn't had anything to drink "in weeks."    History provided by EMS.   Review of Systems  Pertinent positive and negative review of systems noted in HPI.    Physical Exam   Vitals:   10/17/22 0145 10/17/22 0230  BP:  121/71  Pulse:  (!) 104  Resp:  (!) 30  Temp: (!) 101 F (38.3 C)   SpO2:  92%    CONSTITUTIONAL:  chronically ill-appearing, NAD NEURO:  Alert EYES:  eyes equal and reactive ENT/NECK:  Supple, no stridor  CARDIO:  tachycardic, irregular rhythm, skin is mottled PULM:  No respiratory distress,  GI/GU:  non-distended, BRB on DRE, but without hemorrhage MSK/SPINE:  No gross deformities, no edema, moves all extremities, old contusion to left flank SKIN:  no rash, abrasion to left forehead   *Additional and/or pertinent findings included in MDM below  Diagnostic and Interventional Summary    EKG Interpretation  Date/Time:  Tuesday October 17 2022 01:39:48 EDT Ventricular Rate:  121 PR Interval:  152 QRS Duration: 87 QT Interval:  331 QTC Calculation: 470 R Axis:   26 Text Interpretation: Sinus tachycardia with irregular rate vs atrial fibrillation Abnormal R-wave progression, early transition Minimal ST depression, anterolateral leads Confirmed by Drema Pry [phone]) on 10/17/2022 1:50:24 AM       Labs Reviewed  RESP PANEL BY RT-PCR (RSV, FLU A&B, COVID)  RVPGX2 - Abnormal; Notable for the following components:      Result Value   SARS Coronavirus 2 by RT PCR  POSITIVE (*)    All other components within normal limits  CBC WITH DIFFERENTIAL/PLATELET - Abnormal; Notable for the following components:   WBC 11.6 (*)    RBC 3.86 (*)    Hemoglobin 11.4 (*)    HCT 36.4 (*)    RDW 15.9 (*)    Monocytes Absolute 3.2 (*)    Abs Immature Granulocytes 0.22 (*)    All other components within normal limits  COMPREHENSIVE METABOLIC PANEL - Abnormal; Notable for the following components:   Potassium 3.1 (*)    Glucose, Bld 138 (*)    BUN 45 (*)    Creatinine, Ser 1.90 (*)    Total Bilirubin 1.4 (*)    GFR, Estimated 34 (*)    All other components within normal limits  LACTIC ACID, PLASMA - Abnormal; Notable for the following components:   Lactic Acid, Venous 4.6 (*)    All other components within normal limits  APTT - Abnormal; Notable for the following components:   aPTT 23 (*)    All other components within normal limits  URINALYSIS, W/ REFLEX TO CULTURE (INFECTION SUSPECTED) - Abnormal; Notable for the following components:   Color, Urine AMBER (*)    APPearance HAZY (*)    Protein, ur 100 (*)    Bacteria, UA RARE (*)    All other components within normal limits  CBG MONITORING, ED - Abnormal; Notable for the following components:  Glucose-Capillary 113 (*)    All other components within normal limits  I-STAT VENOUS BLOOD GAS, ED - Abnormal; Notable for the following components:   pCO2, Ven 38.0 (*)    pO2, Ven 25 (*)    Acid-base deficit 4.0 (*)    Potassium 3.4 (*)    HCT 30.0 (*)    Hemoglobin 10.2 (*)    All other components within normal limits  CULTURE, BLOOD (ROUTINE X 2)  CULTURE, BLOOD (ROUTINE X 2)  PROTIME-INR  LACTIC ACID, PLASMA  PROCALCITONIN  POC OCCULT BLOOD, ED  TYPE AND SCREEN    CT Lumbar Spine Wo Contrast  Final Result    CT HEAD WO CONTRAST ( )  Final Result    CT Cervical Spine Wo Contrast  Final Result    CT ABDOMEN PELVIS W CONTRAST  Final Result    DG Pelvis Portable  Final Result    DG Chest  Port 1 View  Final Result    CT ANGIO NECK W OR WO CONTRAST    (Results Pending)    Medications  acetaminophen (TYLENOL) suppository 650 mg (has no administration in time range)  lactated ringers infusion (has no administration in time range)  ceFEPIme (MAXIPIME) 2 g in sodium chloride 0.9 % 100 mL IVPB (2 g Intravenous New Bag/Given 10/17/22 0441)  lactated ringers bolus 2,000 mL (2,000 mLs Intravenous New Bag/Given 10/17/22 0340)  vancomycin (VANCOREADY) IVPB 2000 mg/400 mL (has no administration in time range)  lactated ringers bolus 1,000 mL (1,000 mLs Intravenous New Bag/Given 10/17/22 0217)  LORazepam (ATIVAN) injection 0.5 mg (0.5 mg Intravenous Given 10/17/22 0211)  fentaNYL (SUBLIMAZE) injection 50 mcg (50 mcg Intravenous Given 10/17/22 0214)  metroNIDAZOLE (FLAGYL) IVPB 500 mg (0 mg Intravenous Stopped 10/17/22 0430)  iohexol (OMNIPAQUE) 350 MG/ML injection 75 mL (75 mLs Intravenous Contrast Given 10/17/22 0340)  LORazepam (ATIVAN) injection 0.5 mg (0.5 mg Intravenous Given 10/17/22 0410)     Procedures  /  Critical Care .Critical Care  Performed by: Roxy Horseman, PA-C Authorized by: Roxy Horseman, PA-C   Critical care provider statement:    Critical care time (minutes):  55   Critical care was necessary to treat or prevent imminent or life-threatening deterioration of the following conditions:  Sepsis   Critical care was time spent personally by me on the following activities:  Development of treatment plan with patient or surrogate, discussions with consultants, evaluation of patient's response to treatment, examination of patient, ordering and review of laboratory studies, ordering and review of radiographic studies, ordering and performing treatments and interventions, pulse oximetry, re-evaluation of patient's condition and review of old charts   ED Course and Medical Decision Making  I have reviewed the triage vital signs, the nursing notes, and pertinent available  records from the EMR.  Social Determinants Affecting Complexity of Care: Patient has problems living alone.   ED Course:    Medical Decision Making Patient here with recurrent falls.  Recently admitted for COVID 19 and COPD exacerbation.   Larey Seat today and hit his head.  Will check labs and imaging.  Noted to be febrile.  Will get sepsis bundle labs.    Lactic 4.6, code sepsis activated. Will give weight based fluids and broad spectrum abx.  He appears very dry.  AKI noted.  Cr. 1.9.      Amount and/or Complexity of Data Reviewed Labs: ordered. Radiology: ordered and independent interpretation performed.    Details: C2 fracture  Risk OTC drugs. Prescription drug management. Decision  regarding hospitalization.     Consultants: I discussed the case with Hospitalist, Dr. Imogene Burn, who is appreciated for admitting. I consulted with Dr. Jordan Likes from neurosurgery, who recommends soft c-collar and will see patient in AM.  Treatment and Plan: Patient's exam and diagnostic results are concerning for sepsis.  Feel that patient will need admission to the hospital for further treatment and evaluation.  Patient seen by and discussed with attending physician, Dr. Eudelia Bunch, who agrees with plan.  Final Clinical Impressions(s) / ED Diagnoses     ICD-10-CM   1. AKI (acute kidney injury) (HCC)  N17.9     2. COVID-19  U07.1     3. Closed nondisplaced fracture of second cervical vertebra, unspecified fracture morphology, initial encounter [facility])  S12.101A       ED Discharge Orders     None         Discharge Instructions Discussed with and Provided to Patient:   Discharge Instructions   None      Roxy Horseman, PA-C 10/17/22 0451    Nira Conn, MD 10/17/22 [phone]

## 2022-10-18 DIAGNOSIS — W19XXXA Unspecified fall, initial encounter: Secondary | ICD-10-CM | POA: Diagnosis not present

## 2022-10-18 DIAGNOSIS — S12191A Other nondisplaced fracture of second cervical vertebra, initial encounter for closed fracture: Secondary | ICD-10-CM | POA: Diagnosis not present

## 2022-10-18 DIAGNOSIS — Z7189 Other specified counseling: Secondary | ICD-10-CM | POA: Diagnosis not present

## 2022-10-18 DIAGNOSIS — I110 Hypertensive heart disease with heart failure: Secondary | ICD-10-CM | POA: Diagnosis present

## 2022-10-18 DIAGNOSIS — I7143 Infrarenal abdominal aortic aneurysm, without rupture: Secondary | ICD-10-CM

## 2022-10-18 DIAGNOSIS — Y92009 Unspecified place in unspecified non-institutional (private) residence as the place of occurrence of the external cause: Secondary | ICD-10-CM | POA: Diagnosis not present

## 2022-10-18 DIAGNOSIS — N179 Acute kidney failure, unspecified: Secondary | ICD-10-CM | POA: Diagnosis present

## 2022-10-18 DIAGNOSIS — Z515 Encounter for palliative care: Secondary | ICD-10-CM | POA: Diagnosis not present

## 2022-10-18 DIAGNOSIS — D0362 Melanoma in situ of left upper limb, including shoulder: Secondary | ICD-10-CM | POA: Diagnosis present

## 2022-10-18 DIAGNOSIS — G894 Chronic pain syndrome: Secondary | ICD-10-CM | POA: Diagnosis present

## 2022-10-18 DIAGNOSIS — Z8616 Personal history of COVID-19: Secondary | ICD-10-CM | POA: Diagnosis not present

## 2022-10-18 DIAGNOSIS — D038 Melanoma in situ of other sites: Secondary | ICD-10-CM | POA: Diagnosis present

## 2022-10-18 DIAGNOSIS — H548 Legal blindness, as defined in USA: Secondary | ICD-10-CM | POA: Diagnosis present

## 2022-10-18 DIAGNOSIS — U071 COVID-19: Secondary | ICD-10-CM | POA: Diagnosis not present

## 2022-10-18 DIAGNOSIS — R627 Adult failure to thrive: Secondary | ICD-10-CM | POA: Diagnosis present

## 2022-10-18 DIAGNOSIS — K219 Gastro-esophageal reflux disease without esophagitis: Secondary | ICD-10-CM | POA: Diagnosis present

## 2022-10-18 DIAGNOSIS — I6501 Occlusion and stenosis of right vertebral artery: Secondary | ICD-10-CM | POA: Diagnosis present

## 2022-10-18 DIAGNOSIS — J441 Chronic obstructive pulmonary disease with (acute) exacerbation: Secondary | ICD-10-CM

## 2022-10-18 DIAGNOSIS — F32A Depression, unspecified: Secondary | ICD-10-CM | POA: Diagnosis present

## 2022-10-18 DIAGNOSIS — Z79899 Other long term (current) drug therapy: Secondary | ICD-10-CM | POA: Diagnosis not present

## 2022-10-18 DIAGNOSIS — S12110A Anterior displaced Type II dens fracture, initial encounter for closed fracture: Secondary | ICD-10-CM | POA: Diagnosis present

## 2022-10-18 DIAGNOSIS — B957 Other staphylococcus as the cause of diseases classified elsewhere: Secondary | ICD-10-CM | POA: Diagnosis present

## 2022-10-18 DIAGNOSIS — E876 Hypokalemia: Secondary | ICD-10-CM | POA: Diagnosis present

## 2022-10-18 DIAGNOSIS — Z66 Do not resuscitate: Secondary | ICD-10-CM | POA: Diagnosis present

## 2022-10-18 DIAGNOSIS — E872 Acidosis, unspecified: Secondary | ICD-10-CM | POA: Diagnosis present

## 2022-10-18 DIAGNOSIS — K5900 Constipation, unspecified: Secondary | ICD-10-CM | POA: Diagnosis present

## 2022-10-18 DIAGNOSIS — I5032 Chronic diastolic (congestive) heart failure: Secondary | ICD-10-CM | POA: Diagnosis present

## 2022-10-18 DIAGNOSIS — R7881 Bacteremia: Secondary | ICD-10-CM | POA: Diagnosis present

## 2022-10-18 DIAGNOSIS — C799 Secondary malignant neoplasm of unspecified site: Secondary | ICD-10-CM | POA: Diagnosis present

## 2022-10-18 LAB — COMPREHENSIVE METABOLIC PANEL
ALT: 26 U/L (ref 0–44)
AST: 24 U/L (ref 15–41)
Albumin: 2.6 g/dL — ABNORMAL LOW (ref 3.5–5.0)
Alkaline Phosphatase: 41 U/L (ref 38–126)
Anion gap: 7 (ref 5–15)
BUN: 25 mg/dL — ABNORMAL HIGH (ref 8–23)
CO2: 22 mmol/L (ref 22–32)
Calcium: 8.1 mg/dL — ABNORMAL LOW (ref 8.9–10.3)
Chloride: 111 mmol/L (ref 98–111)
Creatinine, Ser: 1.04 mg/dL (ref 0.61–1.24)
GFR, Estimated: >60 mL/min (ref >60)
Glucose, Bld: 103 mg/dL — ABNORMAL HIGH (ref 70–99)
Potassium: 3.1 mmol/L — ABNORMAL LOW (ref 3.5–5.1)
Sodium: 140 mmol/L (ref 135–145)
Total Bilirubin: 1.3 mg/dL — ABNORMAL HIGH (ref 0.3–1.2)
Total Protein: 5.1 g/dL — ABNORMAL LOW (ref 6.5–8.1)

## 2022-10-18 LAB — CBC WITH DIFFERENTIAL/PLATELET
Abs Immature Granulocytes: 0.11 10*3/uL — ABNORMAL HIGH (ref 0.00–0.07)
Basophils Absolute: 0 10*3/uL (ref 0.0–0.1)
Basophils Relative: 0 %
Eosinophils Absolute: 0.1 10*3/uL (ref 0.0–0.5)
Eosinophils Relative: 1 %
HCT: 26.4 % — ABNORMAL LOW (ref 39.0–52.0)
Hemoglobin: 8.6 g/dL — ABNORMAL LOW (ref 13.0–17.0)
Immature Granulocytes: 3 %
Lymphocytes Relative: 21 %
Lymphs Abs: 0.9 10*3/uL (ref 0.7–4.0)
MCH: 30.1 pg (ref 26.0–34.0)
MCHC: 32.6 g/dL (ref 30.0–36.0)
MCV: 92.3 fL (ref 80.0–100.0)
Monocytes Absolute: 1.3 10*3/uL — ABNORMAL HIGH (ref 0.1–1.0)
Monocytes Relative: 30 %
Neutro Abs: 1.9 10*3/uL (ref 1.7–7.7)
Neutrophils Relative %: 45 %
Platelets: 126 10*3/uL — ABNORMAL LOW (ref 150–400)
RBC: 2.86 MIL/uL — ABNORMAL LOW (ref 4.22–5.81)
RDW: 16.2 % — ABNORMAL HIGH (ref 11.5–15.5)
WBC: 4.3 10*3/uL (ref 4.0–10.5)
nRBC: 0 % (ref 0.0–0.2)

## 2022-10-18 LAB — CULTURE, BLOOD (ROUTINE X 2)
Special Requests: ADEQUATE
Special Requests: ADEQUATE

## 2022-10-18 LAB — MAGNESIUM: Magnesium: 1.9 mg/dL (ref 1.7–2.4)

## 2022-10-18 MED ORDER — VANCOMYCIN HCL 1500 MG/300ML IV SOLN
1500.0000 mg | INTRAVENOUS | Status: AC
  Administered 2022-10-18 – 2022-10-23 (×6): 1500 mg via INTRAVENOUS
  Filled 2022-10-18 (×6): qty 300

## 2022-10-18 MED ORDER — ACETAMINOPHEN 325 MG PO TABS
650.0000 mg | ORAL_TABLET | Freq: Three times a day (TID) | ORAL | Status: DC
  Administered 2022-10-18 – 2022-10-30 (×36): 650 mg via ORAL
  Filled 2022-10-18 (×36): qty 2

## 2022-10-18 MED ORDER — POTASSIUM CHLORIDE 10 MEQ/100ML IV SOLN
10.0000 meq | INTRAVENOUS | Status: AC
  Administered 2022-10-18 (×4): 10 meq via INTRAVENOUS
  Filled 2022-10-18 (×4): qty 100

## 2022-10-18 MED ORDER — ENSURE ENLIVE PO LIQD
237.0000 mL | Freq: Three times a day (TID) | ORAL | Status: DC
  Administered 2022-10-18 – 2022-10-30 (×45): 237 mL via ORAL

## 2022-10-18 MED ORDER — ENSURE ENLIVE PO LIQD: 237.0000 mL | Freq: Three times a day (TID) | ORAL | Status: DC

## 2022-10-18 NOTE — Progress Notes (Signed)
Pharmacy Antibiotic Note  Aaron Gibbs is a 88 y.o. male admitted on 10/17/2022 after fall PTA. Pharmacy has been consulted for Vancomycin dosing for bacteremia after BCID showed Staph species in 3 of 4 bottles (2 different sites), no resistance detected.  Received Vancomycin 2gm Iv x 1 4/30 Also received Cefepime 2gm IV and Metronidazole 500 mg IV.  Was inpatient at [facility] 4/23 >>10/16/22 with COVID-19 infection and COPD exacerbation. Completed 5 days of Doxycycline 4/24>>4/28.  AKI. Creatinine up to 1.9 then down to 1.04. On IV fluids.   Plan: Start vancomycin 1500mg  IV q24h this AM for eAUC of 464, using Scr 1.04 Follow renal function, culture data, clinical progress and antibiotic plans. Levels as needed.   Height: 5\' 10"  (177.8 cm) Weight: 91.7 kg (202 lb 2.6 oz) IBW/kg (Calculated) : 73  Temp (24hrs), Avg:97.7 F (36.5 C), Min:97.5 F (36.4 C), Max:98.1 F (36.7 C)  Recent Labs  Lab 10/11/22 2050 10/12/22 0611 10/14/22 0638 10/17/22 0141 10/17/22 0510 10/18/22 0431  WBC 3.8* 3.9* 6.7 11.6*  --  4.3  CREATININE  --  1.15 1.16 1.90*  --  1.04  LATICACIDVEN  --   --   --  4.6* 2.7*  --      Estimated Creatinine Clearance: 59.1 mL/min (by C-G formula based on SCr of 1.04 mg/dL).    Allergies  Allergen Reactions   Anthralin Shortness Of Breath   Azithromycin Shortness Of Breath    Antimicrobials this admission: Vancomycin 4/30 >> Cefepime x 1 on 4/30 Metronidazole x 1 on 4/30   Dose adjustments this admission: n/a  Microbiology results: 4/30 COVID+ (also + on 4/24 - no respiratory symptoms) 4/30 Flu and RSV: negative 4/30 blood: GPC in clusters, pending 4/30 BCID: Staph species in 3 of 4 bottles, no resistance - MRSA PCR: pending  Sherie Dobrowolski A. Jeanella Craze, PharmD, BCPS, FNKF Clinical Pharmacist [facility] Please utilize Amion for appropriate phone number to reach the unit pharmacist [facility])  10/18/2022 8:13 AM

## 2022-10-18 NOTE — TOC CAGE-AID Note (Signed)
Transition of Care [facility]) - CAGE-AID Screening   Patient Details  Name: Aaron Gibbs MRN: [id number] Date of Birth: Apr 09, 1935  Transition of Care [facility]) CM/SW Contact:    Erin Sons, LCSW Phone Number: 10/18/2022, 11:02 AM   CAGE-AID Screening: Substance Abuse Screening unable to be completed due to: :  (Covid positive; unable to reach on room phone)

## 2022-10-18 NOTE — Progress Notes (Signed)
Daily Progress Note   Patient Name: Aaron Gibbs       Date: 10/18/2022 DOB: 10/10/1934  Age: 88 y.o. MRN#: [id number] Attending Physician: Joycelyn Das, MD Primary Care Physician: Smitty Cords, DO Admit Date: 10/17/2022  Reason for Consultation/Follow-up: Establishing goals of care  Patient Profile/HPI:  88 year old male with past medical history of blindness, chronic pain on narcotics, COPD, depression, GERD, hypertension who was recently discharged from [facility] hospital yesterday presented to our hospital after sustaining a fall in the middle of the night.  Patient was brought into the hospital by EMS.  He has had 2 falls at home since discharge.  Of note patient was recently admitted to [facility] hospital on 10/11/2022 for COVID-19 infection.  In the ED, patient was mildly febrile, tachycardic.  Initial labs showed creatinine of 1.9.  COVID test was positive.  WBC mildly elevated at 11.6.  CT head was negative for acute abnormality.  CT C-spine showed displaced C2 fracture of the base of   Radiology recommended CTA of the neck for assessment of right vertebral artery.  CT scan of the abdomen and pelvis showed partially thrombosed infrarenal abdominal aortic aneurysm of 5 x 4.3 cm.  Neurosurgery was then consulted and patient was admitted hospital for acute kidney injury, C-spine fracture.   Blood cultures positive- being treated for bacteremia.   Subjective: Chart reviewed including labs, progress notes, imaging from this and previous encounters.  On eval patient is sleeping. Sitter at bedside reports he has not wanted to eat. He has had nonspecific complaints of pain when awake.  Spoke with daughter, Aaron Gibbs. She would like him to receive Ensure more than 2 times  daily- she notes that he often substitutes Ensure for meals.  We discussed his comorbidities and risk of decompensation as well as likelihood of new lower functional status.  GOC continue to be for full scope care- she is hopeful that Aaron Gibbs's mental status will return to a point where she can have discussion with him regarding his choices and preferences about his care, continued aggressive treatment vs comfort measures, hospice.    Review of Systems  Unable to perform ROS: Mental status change     Physical Exam Vitals and nursing note reviewed.  Cardiovascular:     Rate and Rhythm: Normal rate.  Pulmonary:  Effort: Pulmonary effort is normal.  Neurological:     Comments: sleeping             Vital Signs: BP 126/62 (BP Location: Right Arm)   Pulse 64   Temp 97.7 F (36.5 C) (Oral)   Resp 18   Ht 5\' 10"  (1.778 m)   Wt 91.7 kg   SpO2 94%   BMI 29.01 kg/m  SpO2: SpO2: 94 % O2 Device: O2 Device: Room Air O2 Flow Rate: O2 Flow Rate (L/min): 2 L/min  Intake/output summary:  Intake/Output Summary (Last 24 hours) at 10/18/2022 1655 Last data filed at 10/18/2022 1253 Gross per 24 hour  Intake 981.39 ml  Output 500 ml  Net 481.39 ml   LBM: Last BM Date : 11/12/22 Baseline Weight: Weight: 95.3 kg Most recent weight: Weight: 91.7 kg       Palliative Assessment/Data: PPS: 20%      Patient Active Problem List   Diagnosis Date Noted   DNR (do not resuscitate) 10/17/2022   AKI (acute kidney injury) (HCC) 10/17/2022   C2 cervical fracture (HCC) 10/17/2022   Infrarenal abdominal aortic aneurysm (AAA) without rupture (HCC) 10/17/2022   Hypokalemia 10/17/2022   COVID-19 10/11/2022   Thrombocytopenia (HCC) 10/11/2022   Chronic heart failure with preserved ejection fraction (HCC) 09/13/2022   Palliative care patient 07/17/2022   Other pancytopenia (HCC) 06/09/2022   Unable to maintain body in lying position 05/24/2022   Benign paroxysmal positional vertigo    Weakness  01/29/2022   Sensorineural hearing loss (SNHL) of both ears 07/25/2021   Pain in right knee 03/15/2021   Other chronic pain 03/15/2021   Chronic knee pain (Left) 09/28/2020   Abnormal MRI, knee (08/05/2018) (Left) 09/13/2020   Long-term current use of benzodiazepine 08/09/2020   Opioid dependence, binge pattern (HCC) 08/09/2020   Opioid dependence with or without physiological dependence (HCC) 08/08/2020   History of substance use disorder 04/15/2020   Osteoarthritis of knee (Left) 03/23/2020   Aortic atherosclerosis (HCC) 02/05/2020   Generalized anxiety disorder with panic attacks 02/04/2020   Depression with anxiety 12/13/2019   Depression 12/12/2019   COPD (chronic obstructive pulmonary disease) (HCC)    HLD (hyperlipidemia)    Iron deficiency anemia    Fall at home, initial encounter    Medial meniscal tear, sequela (Left) 06/10/2019   Patellar tendinosis (Right) 06/10/2019   Lateral meniscal tear, sequela (Left) 03/03/2019   Palpitation 02/13/2019   Preop testing 11/14/2018   Noncompliance with medication treatment due to overuse of medication 10/23/2018   Neurogenic pain 08/20/2018   Atherosclerotic peripheral vascular disease (HCC) 07/24/2018   Tricompartment osteoarthritis of knee (Left) 07/24/2018   Osteoarthritis of knee (Bilateral) 07/24/2018   Osteoarthritis of patellofemoral joint (Right) 07/24/2018   History of suicide attempt (06/12/18) 07/24/2018   Long term current use of opiate analgesic 07/10/2018   Pharmacologic therapy 07/10/2018   Disorder of skeletal system 07/10/2018   Problems influencing health status 07/10/2018   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   Major depressive disorder, recurrent episode, severe (HCC) 06/04/2018   Blindness 05/10/2018   Chronic pain syndrome 05/01/2018   DISH (diffuse idiopathic skeletal hyperostosis) 05/01/2018   Osteoarthritis of multiple joints 05/01/2018   Chronic knee pain (1ry area of Pain) (Bilateral) (L>R) 05/01/2018    Retinitis pigmentosa of both eyes 05/01/2018   Glaucoma of both eyes 05/01/2018   Essential hypertension 05/01/2018   Chronic low back pain (Bilateral) w/o sciatica 05/01/2018   GERD (gastroesophageal reflux disease) 05/01/2018  AVM (arteriovenous malformation) of colon 05/01/2018   Therapeutic opioid-induced constipation (OIC) 05/01/2018    Palliative Care Assessment & Plan    Assessment/Recommendations/Plan  Continue current care PMT will follow and see intermittently for improvements in mental status vs decompensation   Code Status: DNR  Prognosis:  Unable to determine  Discharge Planning: To Be Determined  Care plan was discussed with patient and care team.   Thank you for allowing the Palliative Medicine Team to assist in the care of this patient.  Total time:  60 mins  Greater than 50%  of this time was spent counseling and coordinating care related to the above assessment and plan.  Ocie Bob, AGNP-C Palliative Medicine   Please contact Palliative Medicine Team phone at [phone] for questions and concerns.

## 2022-10-18 NOTE — Evaluation (Signed)
Clinical/Bedside Swallow Evaluation Patient Details  Name: Aaron Gibbs MRN: [id number] Date of Birth: 1935-05-16  Today's Date: 10/18/2022 Time: SLP Start Time (ACUTE ONLY): 1415 SLP Stop Time (ACUTE ONLY): 1430 SLP Time Calculation (min) (ACUTE ONLY): 15 min  Past Medical History:  Past Medical History:  Diagnosis Date   Anxiety    Cancer (HCC)    skin   COPD (chronic obstructive pulmonary disease) (HCC)    Glaucoma    Osteoporosis    osteopenia   Suicide attempt (HCC) (05/13/18) 06/26/2018   Past Surgical History:  Past Surgical History:  Procedure Laterality Date   CHOLECYSTECTOMY     TRANSURETHRAL RESECTION OF PROSTATE N/A 2008   HPI:  Patient is an 88 y.o. male with PMH: blindness, chronic pain on chronic narcotics, COPD, depression, GERD, HTN. He was discharged home from [facility] on 10/16/22 (had been admitted since 4/24 with COVID-19) where he has 24/7 caregiver assistance. He presented to [facility] ER on 10/17/22 after a fall and per EMS report, patient has had two falls since discharge home. In ER patient was very agitated and required IV sedation. CT head negative for acute intracranial abnormality. C-spine CT showed minimally displaced C2 fracture of the base of the odontoid. SLP swallow evaluation ordered on 10/18/22 as patient was coughing when taking pills.    Assessment / Plan / Recommendation  Clinical Impression  Patient presents with suspected oropharyngeal dysphagia as per this bedside swallow evaluation, however PO's were limited to some ice chips secondary to patient's lethargy. Per NT sitter in room, patient has been sleeping most of day and he has eaten very little of PO's from meal trays. Patient did follow basic directions and verbalized mainly one-word responses. He was lying on side and so SLP elevated HOB but did kept him in side-lying position. He accepted a few small ice chips, which he did masticate (has a few teeth bottom incisors but effectively edentulous) and  initiate swallow. No overt s/s aspiration or penetration observed with this small amount of PO's. SLP planning to f/u on a different date when hopefully he is more awake and alert. For now, recommending to downgrade diet to Dys 1 (puree) solids and continue with thin liquids. SLP Visit Diagnosis: Dysphagia, unspecified (R13.10)    Aspiration Risk  Mild aspiration risk    Diet Recommendation Dysphagia 1 (Puree);Thin liquid   Liquid Administration via: Cup;Straw Medication Administration: Crushed with puree Supervision: Staff to assist with self feeding;Full supervision/cueing for compensatory strategies Compensations: Slow rate;Small sips/bites Postural Changes: Seated upright at 90 degrees    Other  Recommendations Oral Care Recommendations: Oral care BID    Recommendations for follow up therapy are one component of a multi-disciplinary discharge planning process, led by the attending physician.  Recommendations may be updated based on patient status, additional functional criteria and insurance authorization.  Follow up Recommendations Follow physician's recommendations for discharge plan and follow up therapies      Assistance Recommended at Discharge    Functional Status Assessment Patient has had a recent decline in their functional status and demonstrates the ability to make significant improvements in function in a reasonable and predictable amount of time.  Frequency and Duration min 2x/week  1 week       Prognosis Prognosis for improved oropharyngeal function: Good      Swallow Study   General Date of Onset: 10/18/22 HPI: Patient is an 88 y.o. male with PMH: blindness, chronic pain on chronic narcotics, COPD, depression, GERD, HTN. He was discharged  home from [facility] on 10/16/22 (had been admitted since 4/24 with COVID-19) where he has 24/7 caregiver assistance. He presented to [facility] ER on 10/17/22 after a fall and per EMS report, patient has had two falls since discharge home. In  ER patient was very agitated and required IV sedation. CT head negative for acute intracranial abnormality. C-spine CT showed minimally displaced C2 fracture of the base of the odontoid. SLP swallow evaluation ordered on 10/18/22 as patient was coughing when taking pills. Type of Study: Bedside Swallow Evaluation Previous Swallow Assessment: BSE 2020 Diet Prior to this Study: Regular;Thin liquids (Level 0) Temperature Spikes Noted: No Respiratory Status: Room air History of Recent Intubation: No Behavior/Cognition: Requires cueing;Lethargic/Drowsy Oral Cavity Assessment: Within Functional Limits Oral Care Completed by SLP: Recent completion by staff Oral Cavity - Dentition: Missing dentition;Poor condition;Edentulous Vision: Impaired for self-feeding Self-Feeding Abilities: Total assist Patient Positioning: Partially reclined Baseline Vocal Quality: Low vocal intensity Volitional Cough: Cognitively unable to elicit Volitional Swallow: Unable to elicit    Oral/Motor/Sensory Function Overall Oral Motor/Sensory Function: Within functional limits   Ice Chips Ice chips: Impaired Presentation: Spoon Oral Phase Impairments: Impaired mastication Oral Phase Functional Implications: Prolonged oral transit Pharyngeal Phase Impairments: Suspected delayed Swallow   Thin Liquid Thin Liquid: Not tested    Nectar Thick     Honey Thick     Puree Puree: Not tested   Solid     Solid: Not tested      Angela Nevin, MA, CCC-SLP Speech Therapy

## 2022-10-18 NOTE — Progress Notes (Addendum)
PROGRESS NOTE    Aaron Gibbs  RUE:[id number] DOB: Aug 23, 1934 DOA: 10/17/2022 PCP: Smitty Cords, DO    Brief Narrative:   88 year old male with past medical history of blindness, chronic pain on narcotics, COPD, depression, GERD, hypertension who was recently discharged from [facility] yesterday presented to our hospital after sustaining a fall in the middle of the night.  Patient was brought into the hospital by EMS.  He has had 2 falls at home since discharge.  Of note patient was recently admitted to [facility] hospital on 10/11/2022 for COVID-19 infection.  In the ED, patient was mildly febrile, tachycardic.  Initial labs showed creatinine of 1.9.  COVID test was positive.  WBC mildly elevated at 11.6.  CT head was negative for acute abnormality.  CT C-spine showed displaced C2 fracture of the base of   Radiology recommended CTA of the neck for assessment of right vertebral artery.  CT scan of the abdomen and pelvis showed partially thrombosed infrarenal abdominal aortic aneurysm of 5 x 4.3 cm.  Neurosurgery was then consulted and patient was admitted hospital for acute kidney injury, C-spine fracture.    Assessment and Plan:  AKI (acute kidney injury)  Initial creatinine of 1.9.  Improved with Ringer lactate.  Has not been eating much.  Might continue 1 more day of IV fluids.  Creatinine today up to 1.0.  Hypokalemia.  Potassium of 3.1 today.  Will replenish with IV 40 mEq of potassium.  Check levels in AM.  C2 cervical fracture  Neurosurgery was consulted.  CT angiogram of the neck showed cervical fracture.   Radiology recommended a CT angiogram of the neck for assessment of vertebral artery.  Pending at this time.  At this time, the neurosurgery has recommended no surgical intervention and soft collar.  COVID-19 infection. Stable on room air.    Gram-positive bacteremia.  Blood cultures with Staphylococcus species in 3 out of 4 bottles vancomycin  has been added to the regimen.  Procalcitonin negative.  Lactate was elevated at 2.7.  Temperature max of 98.1 F.   Fall at home, initial encounter Patient with frequent falls.  Palliative care on board for goals of care.  Pending PT, OT evaluation.   DNR (do not resuscitate) Presented with a DNR form.  Chronic heart failure with preserved ejection fraction (HCC) Continue Coreg.  Currently Bumex on hold.   COPD (chronic obstructive pulmonary disease) (HCC) Continue bronchodilators as needed.  Currently stable.  On room air.   Essential hypertension On Coreg twice daily.  Hold Bumex due to AKI.  Slightly elevated blood pressure.   Chronic pain syndrome On buprenorphine patch 10 mcg/h.  Risk of falls due to pain medication.   Hypokalemia Potassium 3.1 today.  Will continue to replenish through IV.  Magnesium 1.9.  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  Was recently diagnosed at [facility].  Plan was outpatient vascular surgery follow-up/CT MRI in 6 months.  FOBT positive. Initial hemoglobin of 11.4.  Repeated hemoglobin today after IV fluids at 8.6.  No  obvious blood loss reported to me.  Ferritin at 38, serum iron 36.   DVT prophylaxis: SCDs Start: 10/17/22 0537   Code Status:     Code Status: DNR  Disposition: Uncertain at this time.  PT evaluation pending, pending clinical improvement  Status is: Observation  The patient will require care spanning > 2 midnights and should be moved to inpatient  because: C2 fracture, deconditioning, debility, fever, need for PT evaluation, gram-positive bacteremia   Family Communication:  I spoke with the patient's daughter Ms. [name] on the phone and updated her about the clinical condition of the patient.  Consultants:  Neurosurgery  Procedures:  Cervical collar  Antimicrobials:  None  Anti-infectives (From admission, onward)    Start      Dose/Rate Route Frequency Ordered Stop   10/18/22 0900  vancomycin (VANCOREADY) IVPB 1500 mg/300 mL        1,500 mg 150 mL/hr over 120 Minutes Intravenous Every 24 hours 10/18/22 0813     10/17/22 2144  vancomycin variable dose per unstable renal function (pharmacist dosing)  Status:  Discontinued         Does not apply See admin instructions 10/17/22 2144 10/18/22 0817   10/17/22 0300  vancomycin (VANCOREADY) IVPB 2000 mg/400 mL        2,000 mg 200 mL/hr over 120 Minutes Intravenous  Once 10/17/22 0249 10/17/22 0815   10/17/22 0245  ceFEPIme (MAXIPIME) 2 g in sodium chloride 0.9 % 100 mL IVPB        2 g 200 mL/hr over 30 Minutes Intravenous  Once 10/17/22 0242 10/17/22 0535   10/17/22 0245  metroNIDAZOLE (FLAGYL) IVPB 500 mg        500 mg 100 mL/hr over 60 Minutes Intravenous  Once 10/17/22 0242 10/17/22 0430   10/17/22 0245  vancomycin (VANCOCIN) IVPB 1000 mg/200 mL premix  Status:  Discontinued        1,000 mg 200 mL/hr over 60 Minutes Intravenous  Once 10/17/22 0242 10/17/22 0249      Subjective: Today, patient was seen and examined at bedside.  Has been little agitated.  Patient was seen and examined at bedside.  Appears to be getting out of the bed at times and complains of neck pain.  Having some cough.    Objective: Vitals:   10/17/22 1023 10/17/22 1100 10/18/22 0515 10/18/22 0848  BP: (!) 157/80  (!) 131/59 (!) 159/94  Pulse: 63  (!) 57 66  Resp: 16  18 19   Temp: (!) 97.5 F (36.4 C)  98.1 F (36.7 C) (!) 97.4 F (36.3 C)  TempSrc: Oral  Oral Oral  SpO2: 94%  95% 94%  Weight:  91.7 kg    Height:  5\' 10"  (1.778 m)      Intake/Output Summary (Last 24 hours) at 10/18/2022 1124 Last data filed at 10/18/2022 1046 Gross per 24 hour  Intake 981.39 ml  Output 200 ml  Net 781.39 ml    Filed Weights   10/17/22 0230 10/17/22 1100  Weight: 95.3 kg 91.7 kg    Physical Examination: Body mass index is 29.01 kg/m.   General: Soft cervical collar in place. in mild  distress, mildly agitated,, elderly male, alert awake and Communicative HENT:   No scleral pallor or icterus noted. Oral mucosa is moist.  Blindness. Chest:   Diminished breath sounds bilaterally. No crackles or wheezes.  CVS: S1 &S2 heard. No murmur.  Regular rate and rhythm. Abdomen: Soft, nontender, nondistended.  Bowel sounds are heard.   Extremities: No cyanosis, clubbing or edema.  Peripheral pulses are palpable. Psych: Alert, awake and confused, trying to get out of the bed, blind, CNS:  No cranial nerve deficits.  Moves extremities, cervical collar in place Skin: Warm and dry.  No rashes noted.  Data Reviewed:   CBC: Recent Labs  Lab 10/11/22 1201 10/11/22 2050 10/12/22 4098 10/14/22 1191 10/17/22  0141 10/17/22 0232 10/18/22 0431  WBC 3.7* 3.8* 3.9* 6.7 11.6*  --  4.3  NEUTROABS DUPLICATE REQUEST  1.5* 2.3  --  3.2 6.2  --  1.9  HGB 11.9* 12.5* 11.3* 11.1* 11.4* 10.2* 8.6*  HCT 37.4* 39.7 35.9* 34.2* 36.4* 30.0* 26.4*  MCV 92.8 91.1 92.5 90.0 94.3  --  92.3  PLT 95* 110* 99* 144* 201  --  126*     Basic Metabolic Panel: Recent Labs  Lab 10/12/22 0611 10/14/22 0638 10/17/22 0141 10/17/22 0232 10/18/22 0431  NA 142 144 143 144 140  K 3.6 3.8 3.1* 3.4* 3.1*  CL 112* 111 108  --  111  CO2 21* 24 22  --  22  GLUCOSE 136* 113* 138*  --  103*  BUN 26* 30* 45*  --  25*  CREATININE 1.15 1.16 1.90*  --  1.04  CALCIUM 8.7* 9.1 9.3  --  8.1*  MG  --   --   --   --  1.9     Liver Function Tests: Recent Labs  Lab 10/12/22 0611 10/14/22 0638 10/17/22 0141 10/18/22 0431  AST 28 33 39 24  ALT 18 31 40 26  ALKPHOS 47 51 54 41  BILITOT 0.6 1.0 1.4* 1.3*  PROT 6.4* 7.0 6.9 5.1*  ALBUMIN 3.3* 3.6 3.7 2.6*      Radiology Studies: CT Lumbar Spine Wo Contrast  Result Date: 10/17/2022 CLINICAL DATA:  88 year old male with recent falls. Spinal ankylosis. EXAM: CT LUMBAR SPINE WITH CONTRAST TECHNIQUE: Technique: Multiplanar CT images of the lumbar spine were  reconstructed from contemporary CT of the Abdomen and Pelvis. RADIATION DOSE REDUCTION: This exam was performed according to the departmental dose-optimization program which includes automated exposure control, adjustment of the mA and/or kV according to patient size and/or use of iterative reconstruction technique. CONTRAST:  No additional COMPARISON:  CT Abdomen and Pelvis today reported separately. Thoracic and lumbar spine CT 10/10/2022. FINDINGS: Segmentation: Normal on the comparison. There is a vestigial S1-S2 disc space. Alignment: Stable lumbar lordosis. No significant lumbar scoliosis or spondylolisthesis. Vertebrae: Partially visible diffuse spinal, bilateral SI joint ankylosis and bilateral lower rib costovertebral as noted on the recent comparisons. Associated osteopenia. Maintained lower thoracic and lumbar vertebral height and alignment. No acute osseous abnormality identified. Paraspinal and other soft tissues: Lower chest, abdomen and pelvis viscera are detailed separately. Paraspinal soft tissues remarkable for generalized paraspinal muscle atrophy. Also T12 level left paraspinal hematoma or contusion partially visible on series 3, image 39 and better demonstrated on series 4, image 15 of the CT Abdomen and Pelvis today. This measures about 7 cm long axis, situated in the atrophied paraspinal muscle and subcutaneous layer. No nearby posterior left lower rib fracture is identified. Disc levels: Diffuse spinal ankylosis. No CT evidence of lumbar spinal stenosis. IMPRESSION: 1. Diffuse spinal ankylosis (ankylosing spondylitis) with no acute osseous abnormality in the Lumbar Spine. 2. Left paraspinal hematoma (7 cm) at the T12 vertebral level. 3. CT Abdomen and Pelvis today reported separately. Electronically Signed   By: Odessa Fleming M.D.   On: 10/17/2022 04:46   CT ABDOMEN PELVIS W CONTRAST  Result Date: 10/17/2022 CLINICAL DATA:  Frequent falls. Most recently yesterday. Abdominal and back pain  secondary to falls. EXAM: CT ABDOMEN AND PELVIS WITH CONTRAST TECHNIQUE: Multidetector CT imaging of the abdomen and pelvis was performed using the standard protocol following bolus administration of intravenous contrast. RADIATION DOSE REDUCTION: This exam was performed according to the departmental  dose-optimization program which includes automated exposure control, adjustment of the mA and/or kV according to patient size and/or use of iterative reconstruction technique. CONTRAST:  75mL OMNIPAQUE IOHEXOL 350 MG/ML SOLN COMPARISON:  Chest, abdomen and pelvis CT with IV contrast 1 week ago 10/10/2022. FINDINGS: Technical note: Patient could not lie flat and was scanned right lateral decubitus. The patient's arms were also in the field creating streak artifact. Lower chest: There are positional atelectatic changes on the right, linear scarring or atelectasis in the lingula. Lung bases are clear of infiltrate. The cardiac size is normal. Hepatobiliary: Not as well seen as previously due to artifact from breathing motion and the patient's arms in the field. No mass or laceration is seen through the artifacts. The liver is mildly steatotic. Old cholecystectomy again noted with extrahepatic and central intrahepatic biliary prominence, as before, with the common bile duct again measuring 13 mm. No ductal filling defect is seen. Pancreas: Moderately atrophic.  No other visible abnormality. Spleen: No mass or laceration is seen through the motion and streak artifact. Adrenals/Urinary Tract: There is no adrenal mass. Again noted is a 1.4 cm heterogeneous indeterminate lesion of the dorsal superior pole right kidney, better seen on the prior study, slightly smaller simple cyst more laterally in the upper pole. No other focal renal abnormality is seen. Renal mass protocol MRI is recommended when clinically feasible. There is no urinary stone or obstruction, no bladder thickening. Stomach/Bowel: No dilatation or wall  thickening, including the appendix. There is colonic diverticulosis without evidence of diverticulitis. Vascular/Lymphatic: There is a partially thrombosed fusiform infrarenal AAA again measuring 5 x 4.3 cm. There is aortoiliac atherosclerosis. No adenopathy is seen. Reproductive: Prostate is unremarkable. Other: No abdominal wall hernia or focal abnormality. No abdominopelvic ascites. There is moderate abdominal laxity likely obesity related. Musculoskeletal: Diffuse osteopenia. There are diffuse bridging syndesmophytes of the visualized lower thoracic and lumbar spine. There is bony fusion across the distal spinous processes from the lower thoracic spine down. The SI joints are fused. There is moderate bilateral hip DJD. No acute or other significant osseous findings. IMPRESSION: 1. No acute trauma related findings in the abdomen or pelvis. Image quality degraded by motion and streak artifact in the abdomen. 2. 5 x 4.3 cm partially thrombosed fusiform infrarenal AAA. Recommend follow-up every 6 months and vascular consultation. Reference: J Am Coll Radiol 2013;10:789-794. 3. 1.4 cm heterogeneous indeterminate lesion of the dorsal superior pole right kidney, better seen on the prior study. Renal mass protocol MRI is recommended when clinically feasible. 4. Diverticulosis without evidence of diverticulitis. 5. Aortic atherosclerosis. 6. Diffuse spinal bridging syndesmophytes with bony bridging across the distal spinous processes. Bilaterally fused SI joints. Aortic Atherosclerosis (ICD10-I70.0). Electronically Signed   By: Almira Bar M.D.   On: 10/17/2022 04:18   CT HEAD WO CONTRAST ( )  Result Date: 10/17/2022 CLINICAL DATA:  Fall EXAM: CT HEAD WITHOUT CONTRAST CT CERVICAL SPINE WITHOUT CONTRAST TECHNIQUE: Multidetector CT imaging of the head and cervical spine was performed following the standard protocol without intravenous contrast. Multiplanar CT image reconstructions of the cervical spine were also  generated. RADIATION DOSE REDUCTION: This exam was performed according to the departmental dose-optimization program which includes automated exposure control, adjustment of the mA and/or kV according to patient size and/or use of iterative reconstruction technique. COMPARISON:  10/10/2022 FINDINGS: CT HEAD FINDINGS Brain: There is no mass, hemorrhage or extra-axial collection. The size and configuration of the ventricles and extra-axial CSF spaces are normal. There is hypoattenuation of  the periventricular white matter, most commonly indicating chronic ischemic microangiopathy. Vascular: No abnormal hyperdensity of the major intracranial arteries or dural venous sinuses. No intracranial atherosclerosis. Skull: The visualized skull base, calvarium and extracranial soft tissues are normal. Sinuses/Orbits: No fluid levels or advanced mucosal thickening of the visualized paranasal sinuses. No mastoid or middle ear effusion. The orbits are normal. CT CERVICAL SPINE FINDINGS Alignment: No static subluxation. Facets are aligned. Occipital condyles are normally positioned. Skull base and vertebrae: Minimally displaced fracture of C2, at the base of the odontoid. Fracture extends to the right lateral mass. (AO Spine C2 type A). No other acute fracture. There is diffuse ankylosis. Soft tissues and spinal canal: No prevertebral fluid or swelling. No visible canal hematoma. Disc levels: No advanced spinal canal or neural foraminal stenosis. Upper chest: No pneumothorax, pulmonary nodule or pleural effusion. Other: Normal visualized paraspinal cervical soft tissues. IMPRESSION: 1. No acute intracranial abnormality. 2. Minimally displaced fracture of C2, at the base of the odontoid. Fracture extends to the right lateral mass (AO Spine C2 type A). 3. Consider CTA neck for assessment of the right vertebral artery since the C2 fracture affects the right transverse foramen. These results were called by telephone at the time of  interpretation on 10/17/2022 at 3:55 am to provider Roxy Horseman , who verbally acknowledged these results. Electronically Signed   By: Deatra Robinson M.D.   On: 10/17/2022 03:56   CT Cervical Spine Wo Contrast  Result Date: 10/17/2022 CLINICAL DATA:  Fall EXAM: CT HEAD WITHOUT CONTRAST CT CERVICAL SPINE WITHOUT CONTRAST TECHNIQUE: Multidetector CT imaging of the head and cervical spine was performed following the standard protocol without intravenous contrast. Multiplanar CT image reconstructions of the cervical spine were also generated. RADIATION DOSE REDUCTION: This exam was performed according to the departmental dose-optimization program which includes automated exposure control, adjustment of the mA and/or kV according to patient size and/or use of iterative reconstruction technique. COMPARISON:  10/10/2022 FINDINGS: CT HEAD FINDINGS Brain: There is no mass, hemorrhage or extra-axial collection. The size and configuration of the ventricles and extra-axial CSF spaces are normal. There is hypoattenuation of the periventricular white matter, most commonly indicating chronic ischemic microangiopathy. Vascular: No abnormal hyperdensity of the major intracranial arteries or dural venous sinuses. No intracranial atherosclerosis. Skull: The visualized skull base, calvarium and extracranial soft tissues are normal. Sinuses/Orbits: No fluid levels or advanced mucosal thickening of the visualized paranasal sinuses. No mastoid or middle ear effusion. The orbits are normal. CT CERVICAL SPINE FINDINGS Alignment: No static subluxation. Facets are aligned. Occipital condyles are normally positioned. Skull base and vertebrae: Minimally displaced fracture of C2, at the base of the odontoid. Fracture extends to the right lateral mass. (AO Spine C2 type A). No other acute fracture. There is diffuse ankylosis. Soft tissues and spinal canal: No prevertebral fluid or swelling. No visible canal hematoma. Disc levels: No  advanced spinal canal or neural foraminal stenosis. Upper chest: No pneumothorax, pulmonary nodule or pleural effusion. Other: Normal visualized paraspinal cervical soft tissues. IMPRESSION: 1. No acute intracranial abnormality. 2. Minimally displaced fracture of C2, at the base of the odontoid. Fracture extends to the right lateral mass (AO Spine C2 type A). 3. Consider CTA neck for assessment of the right vertebral artery since the C2 fracture affects the right transverse foramen. These results were called by telephone at the time of interpretation on 10/17/2022 at 3:55 am to provider Roxy Horseman , who verbally acknowledged these results. Electronically Signed   By: Caryn Bee  Chase Picket M.D.   On: 10/17/2022 03:56   DG Pelvis Portable  Result Date: 10/17/2022 CLINICAL DATA:  Recent fall with pelvic pain, initial encounter EXAM: PORTABLE PELVIS 1-2 VIEWS COMPARISON:  10/10/2022 FINDINGS: Degenerative changes of the hip joints are noted bilaterally. Pelvic ring is rotated to the right although no acute fracture is seen. No soft tissue abnormality is noted. IMPRESSION: No acute abnormality noted. Electronically Signed   By: Alcide Clever M.D.   On: 10/17/2022 03:03   DG Chest Port 1 View  Result Date: 10/17/2022 CLINICAL DATA:  Recent fall with chest pain, initial encounter EXAM: PORTABLE CHEST 1 VIEW COMPARISON:  06/16/2022 FINDINGS: Cardiac shadow is within normal limits. Patient is significantly rotated to the right accentuating the mediastinal markings. The lungs are clear. No acute bony abnormality is noted. IMPRESSION: No acute abnormality noted. Electronically Signed   By: Alcide Clever M.D.   On: 10/17/2022 03:02      LOS: 0 days    Joycelyn Das, MD [facility] Available via Epic secure chat 7am-7pm After these hours, please refer to coverage provider listed on [email or web address] 10/18/2022, 11:24 AM

## 2022-10-18 NOTE — Progress Notes (Addendum)
PT Cancellation Note  Patient Details Name: Aaron Gibbs MRN: [id number] DOB: 1934-10-13   Cancelled Treatment:    Reason Eval/Treat Not Completed: Patient at procedure or test/unavailable. Pt currently with SLP. Will check back as schedule allows to initiate PT evaluation.   1433: Discussed pt case with SLP who states pt continues to be lethargic with minimal participation. Will hold PT eval this afternoon and attempt again at the next available date.    Marylynn Pearson 10/18/2022, 2:17 PM  Conni Slipper, PT, DPT Acute Rehabilitation Services Secure Chat Preferred Office: [phone]

## 2022-10-19 ENCOUNTER — Ambulatory Visit: Payer: Medicare Other | Admitting: Pain Medicine

## 2022-10-19 DIAGNOSIS — Z66 Do not resuscitate: Secondary | ICD-10-CM | POA: Diagnosis not present

## 2022-10-19 DIAGNOSIS — Z7189 Other specified counseling: Secondary | ICD-10-CM

## 2022-10-19 DIAGNOSIS — G894 Chronic pain syndrome: Secondary | ICD-10-CM | POA: Diagnosis not present

## 2022-10-19 DIAGNOSIS — S12191A Other nondisplaced fracture of second cervical vertebra, initial encounter for closed fracture: Secondary | ICD-10-CM | POA: Diagnosis not present

## 2022-10-19 DIAGNOSIS — N179 Acute kidney failure, unspecified: Secondary | ICD-10-CM | POA: Diagnosis not present

## 2022-10-19 DIAGNOSIS — Z515 Encounter for palliative care: Secondary | ICD-10-CM

## 2022-10-19 LAB — BASIC METABOLIC PANEL
Anion gap: 4 — ABNORMAL LOW (ref 5–15)
BUN: 22 mg/dL (ref 8–23)
CO2: 25 mmol/L (ref 22–32)
Calcium: 8.4 mg/dL — ABNORMAL LOW (ref 8.9–10.3)
Chloride: 111 mmol/L (ref 98–111)
Creatinine, Ser: 1.08 mg/dL (ref 0.61–1.24)
GFR, Estimated: >60 mL/min (ref >60)
Glucose, Bld: 104 mg/dL — ABNORMAL HIGH (ref 70–99)
Potassium: 3.5 mmol/L (ref 3.5–5.1)
Sodium: 140 mmol/L (ref 135–145)

## 2022-10-19 LAB — CBC
HCT: 28.1 % — ABNORMAL LOW (ref 39.0–52.0)
Hemoglobin: 9 g/dL — ABNORMAL LOW (ref 13.0–17.0)
MCH: 30.3 pg (ref 26.0–34.0)
MCHC: 32 g/dL (ref 30.0–36.0)
MCV: 94.6 fL (ref 80.0–100.0)
Platelets: 134 10*3/uL — ABNORMAL LOW (ref 150–400)
RBC: 2.97 MIL/uL — ABNORMAL LOW (ref 4.22–5.81)
RDW: 16.7 % — ABNORMAL HIGH (ref 11.5–15.5)
WBC: 3.7 10*3/uL — ABNORMAL LOW (ref 4.0–10.5)
nRBC: 0 % (ref 0.0–0.2)

## 2022-10-19 LAB — MAGNESIUM: Magnesium: 2.1 mg/dL (ref 1.7–2.4)

## 2022-10-19 LAB — CULTURE, BLOOD (ROUTINE X 2)

## 2022-10-19 NOTE — Evaluation (Signed)
Physical Therapy Evaluation  Patient Details Name: Aaron Gibbs MRN: [id number] DOB: 1935/05/07 Today's Date: 10/19/2022  History of Present Illness  88 year old male who presented after a fall, found to have C2 fx. PMHx: blindness, chronic pain on narcotics, COPD, depression, GERD, hypertension, recently discharged from [facility] hospital (admitted for fall, found to have COVID)   Clinical Impression  Pt admitted with above diagnosis. Pt currently with functional limitations due to the deficits listed below (see PT Problem List). At the time of PT eval pt refused to perform functional mobility due to pain, refused to exit R sidelying position for full assessment of RUE/RLE. RN notified that pt requesting pain meds, however faces pain scale at the time of session was 2/10. Pt was approaching agitation with assessment. Based on performance today and history of falls, recommend post-acute rehab <3 hours/day to maximize functional independence and safety prior to return home with caregiver. Acutely, pt will benefit from acute skilled PT to increase their independence and safety with mobility to allow discharge.          Recommendations for follow up therapy are one component of a multi-disciplinary discharge planning process, led by the attending physician.  Recommendations may be updated based on patient status, additional functional criteria and insurance authorization.  Follow Up Recommendations Can patient physically be transported by private vehicle: No     Assistance Recommended at Discharge Frequent or constant Supervision/Assistance  Patient can return home with the following  Assist for transportation;Help with stairs or ramp for entrance;Assistance with feeding;Two people to help with walking and/or transfers;Two people to help with bathing/dressing/bathroom;Assistance with cooking/housework;Direct supervision/assist for medications management;Direct supervision/assist for  financial management    Equipment Recommendations None recommended by PT (TBD by next venue of care)  Recommendations for Other Services       Functional Status Assessment Patient has had a recent decline in their functional status and demonstrates the ability to make significant improvements in function in a reasonable and predictable amount of time.     Precautions / Restrictions Precautions Precautions: Fall;Cervical Precaution Booklet Issued: No Precaution Comments: blind, COVID Required Braces or Orthoses: Cervical Brace Cervical Brace: At all times;Soft collar (x6 weeks) Restrictions Weight Bearing Restrictions: No      Mobility  Bed Mobility               General bed mobility comments: Pt refused    Transfers                   General transfer comment: Pt refused    Ambulation/Gait               General Gait Details: Pt refused  Stairs            Wheelchair Mobility    Modified Rankin (Stroke Patients Only)       Balance Overall balance assessment:  (Unable to assess)                                           Pertinent Vitals/Pain Pain Assessment Pain Assessment: Faces Faces Pain Scale: Hurts a little bit Pain Location: neck; Pt reports severe pain and asking for pain meds - faces pain scale 2/10 Pain Descriptors / Indicators: Aching, Discomfort, Sore Pain Intervention(s): Limited activity within patient's tolerance, Monitored during session, Repositioned    Home Living Family/patient expects to be discharged to:: Private  residence Living Arrangements: Other (Comment) Available Help at Discharge: Personal care attendant;Available 24 hours/day Type of Home: House Home Access: Stairs to enter Entrance Stairs-Rails: Right;Left;Can reach both Entrance Stairs-Number of Steps: 2   Home Layout: One level Home Equipment: Agricultural consultant (2 wheels);Transport chair;Shower seat Additional Comments: Above info  taken from chart, pt unable/unwilling to report    Prior Function Prior Level of Function : Needs assist             Mobility Comments: RW and guided by caregiver, multiple falls ADLs Comments: caregiver assists with all aspects due to low vision     Hand Dominance   Dominant Hand: Right    Extremity/Trunk Assessment   Upper Extremity Assessment Upper Extremity Assessment: LUE deficits/detail;RUE deficits/detail RUE Deficits / Details: Pt refused to exit R sidelying position for therapist to assess RUE LUE Deficits / Details: Bilaterally, pt able to demonstrate fairly good grip strength. On the L side, 4-/5 strength in biceps, and active shoulder flexion ~160 in sidelying.    Lower Extremity Assessment Lower Extremity Assessment: Generalized weakness;RLE deficits/detail RLE Deficits / Details: Bilaterally, pt reports no sensation differences between R and L side. 4-/5 strength estimated in quads, hamstrings, ankle DF. Muscles tested in sidelying as pt refused to exit R sidelying or sit EOB for formal assessment of LE"s    Cervical / Trunk Assessment Cervical / Trunk Assessment: Kyphotic (Soft collar donned)  Communication   Communication: HOH  Cognition Arousal/Alertness: Lethargic Behavior During Therapy: Flat affect (Approaching agitation as session progressed) Overall Cognitive Status: Impaired/Different from baseline Area of Impairment: Attention, Safety/judgement, Awareness, Problem solving                   Current Attention Level: Sustained     Safety/Judgement: Decreased awareness of safety, Decreased awareness of deficits Awareness: Intellectual Problem Solving: Slow processing, Decreased initiation, Requires verbal cues General Comments: lethargic but easily rouses to voice, follows simple 1 step commands. As session progressed pt became more and more irritated, approaching agitation by end of session.        General Comments      Exercises      Assessment/Plan    PT Assessment Patient needs continued PT services  PT Problem List Decreased mobility;Decreased activity tolerance;Decreased balance;Decreased strength;Decreased cognition;Decreased knowledge of use of DME;Decreased safety awareness;Decreased knowledge of precautions;Pain       PT Treatment Interventions Therapeutic activities;DME instruction;Gait training;Therapeutic exercise;Patient/family education;Balance training;Stair training;Functional mobility training;Neuromuscular re-education    PT Goals (Current goals can be found in the Care Plan section)  Acute Rehab PT Goals Patient Stated Goal: Decrease pain PT Goal Formulation: With patient Time For Goal Achievement: 11/02/22 Potential to Achieve Goals: Good    Frequency Min 2X/week     Co-evaluation               AM-PAC PT "6 Clicks" Mobility  Outcome Measure Help needed turning from your back to your side while in a flat bed without using bedrails?: Total Help needed moving from lying on your back to sitting on the side of a flat bed without using bedrails?: Total Help needed moving to and from a bed to a chair (including a wheelchair)?: Total Help needed standing up from a chair using your arms (e.g., wheelchair or bedside chair)?: Total Help needed to walk in hospital room?: Total Help needed climbing 3-5 steps with a railing? : Total 6 Click Score: 6    End of Session Equipment Utilized During Treatment: Cervical collar  Activity Tolerance: Patient limited by pain Patient left: in bed;with call bell/phone within reach;with bed alarm set Nurse Communication: Mobility status;Patient requests pain meds PT Visit Diagnosis: Other abnormalities of gait and mobility (R26.89);Muscle weakness (generalized) (M62.81)    Time: 1027-2536 PT Time Calculation (min) (ACUTE ONLY): 11 min   Charges:   PT Evaluation $PT Eval Moderate Complexity: 1 Mod          Conni Slipper, PT, DPT Acute  Rehabilitation Services Secure Chat Preferred Office: [phone]  Marylynn Pearson 10/19/2022, 2:35 PM

## 2022-10-19 NOTE — Progress Notes (Addendum)
Palliative Medicine Inpatient Follow Up Note   HPI:  88 year old male with past medical history of blindness, chronic pain on narcotics, COPD, depression, GERD, hypertension who was recently discharged from [facility] regional hospital yesterday presented to our hospital after sustaining a fall in the middle of the night.  Patient was brought into the hospital by EMS.  He has had 2 falls at home since discharge.   Palliative care has been asked to get involved to help establish goals of care in the setting of chronic comorbid conditions and overall declining health.  Today's Discussion 10/19/2022  *Please note that this is a verbal dictation therefore any spelling or grammatical errors are due to the "Dragon Medical One" system interpretation.  Chart reviewed inclusive of vital signs, progress notes, laboratory results, and diagnostic images.   I met with Aaron Gibbs at bedside this afternoon.  His friend was present at bedside.  We called patient's daughter on speaker phone for further conversations as she had called the palliative care team stating that her dermatology office who had recently done a biopsy of Aaron Gibbs's arm identified that he had malignant melanoma.  When this information was presented to Aaron Gibbs we did review that malignant melanoma can be quite rapid in terms of progression.  In regard to potential goals and options Aaron Gibbs is interested in speaking with an oncologist to further identify what treatment would look like and/or if there is any treatment.  Created space and opportunity for patient to explore thoughts feelings and fears regarding current medical situation.  We further reviewed that if Aaron Gibbs is not a good candidate for additional therapies under the recommendations of the oncology team that the alternative approach to care would be through hospice care.  I explained that under hospice care the focus changes from curative to symptom based.  I reviewed that the goals  would be to preserve dignity and quality at the end of life.  Questions and concerns addressed/Palliative Support Provided.   Objective Assessment: Vital Signs Vitals:   10/18/22 1612 10/18/22 2157  BP: 126/62 (!) 128/106  Pulse: 64 (!) 59  Resp: 18 18  Temp: 97.7 F (36.5 C) 97.8 F (36.6 C)  SpO2: 94% 92%    Intake/Output Summary (Last 24 hours) at 10/19/2022 1737 Last data filed at 10/19/2022 1300 Gross per 24 hour  Intake 540 ml  Output --  Net 540 ml   Last Weight  Most recent update: 10/19/2022  5:41 AM    Weight  91.7 kg (202 lb 2.6 oz)            Gen: Elderly chronically ill-appearing Caucasian male in no acute distress HEENT: Dry mucous membranes CV: Regular rate and rhythm PULM: On room air breathing is even and unlabored ABD: soft/nontender EXT: RLE edema Neuro: Alert and oriented x3  SUMMARY OF RECOMMENDATIONS   DNAR/DNI  Patient's daughter called and shared that Aaron Gibbs has malignant melanoma per their dermatologist --> Patient see's [facility]  Aaron Gibbs would like an oncology consultation to better identify options  Gently broached the topic of hospice if no interventions are desired  Ongoing palliative care support  Billing based on MDM: High  ______________________________________________________________________________________ Lamarr Lulas [place] Palliative Medicine Team Team Cell Phone: [phone] Please utilize secure chat with additional questions, if there is no response within 30 minutes please call the above phone number  Palliative Medicine Team providers are available by phone from 7am to 7pm daily and can be reached through the team cell phone.  Should this patient require assistance outside of these hours, please call the patient's attending physician.     

## 2022-10-19 NOTE — Progress Notes (Addendum)
PROGRESS NOTE    Aaron Gibbs  ZOX:[id number] DOB: June 08, 1935 DOA: 10/17/2022 PCP: Smitty Cords, DO    Brief Narrative:   88 year old male with past medical history of blindness, chronic pain on narcotics, COPD, depression, GERD, hypertension who was recently discharged from [facility] hospital yesterday presented to our hospital after sustaining a fall in the middle of the night.  Patient was brought into the hospital by EMS.  He has had 2 falls at home since discharge.  Of note patient was recently admitted to [facility] hospital on 10/11/2022 for COVID-19 infection.  In the ED, patient was mildly febrile, tachycardic.  Initial labs showed creatinine of 1.9.  COVID test was positive.  WBC mildly elevated at 11.6.  CT head was negative for acute abnormality.  CT C-spine showed displaced C2 fracture of the base of   Radiology recommended CTA of the neck for assessment of right vertebral artery.  CT scan of the abdomen and pelvis showed partially thrombosed infrarenal abdominal aortic aneurysm of 5 x 4.3 cm.  Neurosurgery was then consulted and patient was admitted hospital for acute kidney injury, C-spine fracture.    Assessment and Plan:  AKI (acute kidney injury)  Initial creatinine of 1.9.  Improved with Ringer lactate.    Creatinine of 1.0 today.  Hypokalemia.  Improved after replacement.  Potassium of 3.5 today.  C2 cervical fracture  Neurosurgery was consulted.  CT angiogram of the neck showed cervical fracture.   Radiology recommended a CT angiogram of the neck for assessment of vertebral artery.  Pending at this time.  At this time, the neurosurgery has recommended no surgical intervention and soft collar.  Communicated with neurosurgery again today.  Will likely need soft collar for 6 weeks.  Neurosurgery Dr. Jordan Likes does not think that CTA of the neck is necessary at this time.  COVID-19 infection. Stable on room air.  Asymptomatic.  No need for  treatment.  Lactic acidosis.  Present on admission.  Could be secondary to infection.  Received IV fluids.  On IV antibiotic.  Gram-positive bacteremia.  Blood cultures with Staphylococcus species in 3 out of 4 bottles vancomycin has been added to the regimen.  Cultures showing Staph epidermidis and hominis at this time.  Procalcitonin less than 0.1.  Lactate was elevated at 2.7.  Temperature max of 98.1 F.   Fall at home, initial encounter Patient with frequent falls.  Palliative care on board for goals of care.  Pending PT, OT evaluation.   Chronic heart failure with preserved ejection fraction (HCC) Continue Coreg.  Currently Bumex on hold.  Compensated at this time.   COPD (chronic obstructive pulmonary disease) (HCC) Continue bronchodilators as needed.  Currently stable.  On room air.   Essential hypertension On Coreg twice daily.  Hold Bumex due to AKI.  Slightly elevated blood pressure.   Chronic pain syndrome On buprenorphine patch 10 mcg/h.  Risk of falls due to pain medication.   Hypokalemia Potassium 3.5 today.  IV potassium was replenished.  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  Was recently diagnosed at [facility].  Plan was outpatient vascular surgery follow-up/CT MRI in 6 months.  FOBT positive. Initial hemoglobin of 11.4.  Repeated hemoglobin  at 9.0.  No  obvious blood loss reported to me.  Ferritin at 38, serum iron 36.   DVT prophylaxis: SCDs Start: 10/17/22 0537   Code Status:     Code Status: DNR  Disposition: Uncertain at this time.  PT evaluation pending, pending clinical improvement  Status is: Inpatient  The patient is inpatient because: C2 fracture, deconditioning, debility,  gram-positive bacteremia   Family Communication:  I spoke with the patient's daughter Ms. [name] on the phone and updated her about the clinical condition of the patient on  10/19/2022.  Consultants:  Neurosurgery  Procedures:  Cervical collar  Antimicrobials:  None  Anti-infectives (From admission, onward)    Start     Dose/Rate Route Frequency Ordered Stop   10/18/22 0900  vancomycin (VANCOREADY) IVPB 1500 mg/300 mL        1,500 mg 150 mL/hr over 120 Minutes Intravenous Every 24 hours 10/18/22 0813     10/17/22 2144  vancomycin variable dose per unstable renal function (pharmacist dosing)  Status:  Discontinued         Does not apply See admin instructions 10/17/22 2144 10/18/22 0817   10/17/22 0300  vancomycin (VANCOREADY) IVPB 2000 mg/400 mL        2,000 mg 200 mL/hr over 120 Minutes Intravenous  Once 10/17/22 0249 10/17/22 0815   10/17/22 0245  ceFEPIme (MAXIPIME) 2 g in sodium chloride 0.9 % 100 mL IVPB        2 g 200 mL/hr over 30 Minutes Intravenous  Once 10/17/22 0242 10/17/22 0535   10/17/22 0245  metroNIDAZOLE (FLAGYL) IVPB 500 mg        500 mg 100 mL/hr over 60 Minutes Intravenous  Once 10/17/22 0242 10/17/22 0430   10/17/22 0245  vancomycin (VANCOCIN) IVPB 1000 mg/200 mL premix  Status:  Discontinued        1,000 mg 200 mL/hr over 60 Minutes Intravenous  Once 10/17/22 0242 10/17/22 0249      Subjective: Today, patient was seen and examined at bedside.  Caretaker at bedside.  Patient appears to be sleepy at this time.  No interval complaints reported.  Objective: Vitals:   10/18/22 1605 10/18/22 1612 10/18/22 2157 10/19/22 0500  BP: (!) 104/40 126/62 (!) 128/106   Pulse: 66 64 (!) 59   Resp: 18 18 18    Temp: 98.4 F (36.9 C) 97.7 F (36.5 C) 97.8 F (36.6 C)   TempSrc: Oral Oral Oral   SpO2: 93% 94% 92%   Weight:    91.7 kg  Height:        Intake/Output Summary (Last 24 hours) at 10/19/2022 1013 Last data filed at 10/19/2022 0400 Gross per 24 hour  Intake 537 ml  Output 500 ml  Net 37 ml    Filed Weights   10/17/22 0230 10/17/22 1100 10/19/22 0500  Weight: 95.3 kg 91.7 kg 91.7 kg    Physical Examination: Body mass  index is 29.01 kg/m.   General: Soft cervical collar in place, elderly male somnolent today. HENT:   No scleral pallor or icterus noted. Oral mucosa is moist.  Blindness. Chest:   Diminished breath sounds bilaterally. No crackles or wheezes.  CVS: S1 &S2 heard. No murmur.  Regular rate and rhythm. Abdomen: Soft, nontender, nondistended.  Bowel sounds are heard.   Extremities: No cyanosis, clubbing or edema.  Peripheral pulses are palpable. Psych: Somnolent today. CNS: Somnolent at the time of my exam.  Cervical collar in place Skin: Warm and dry.  No rashes noted.  Data Reviewed:   CBC: Recent Labs  Lab 10/14/22 0638 10/17/22 0141 10/17/22 0232 10/18/22 0431 10/19/22 0422  WBC 6.7 11.6*  --  4.3 3.7*  NEUTROABS 3.2 6.2  --  1.9  --   HGB 11.1* 11.4* 10.2* 8.6* 9.0*  HCT 34.2* 36.4* 30.0* 26.4* 28.1*  MCV 90.0 94.3  --  92.3 94.6  PLT 144* 201  --  126* 134*     Basic Metabolic Panel: Recent Labs  Lab 10/14/22 0638 10/17/22 0141 10/17/22 0232 10/18/22 0431 10/19/22 0422  NA 144 143 144 140 140  K 3.8 3.1* 3.4* 3.1* 3.5  CL 111 108  --  111 111  CO2 24 22  --  22 25  GLUCOSE 113* 138*  --  103* 104*  BUN 30* 45*  --  25* 22  CREATININE 1.16 1.90*  --  1.04 1.08  CALCIUM 9.1 9.3  --  8.1* 8.4*  MG  --   --   --  1.9 2.1     Liver Function Tests: Recent Labs  Lab 10/14/22 0638 10/17/22 0141 10/18/22 0431  AST 33 39 24  ALT 31 40 26  ALKPHOS 51 54 41  BILITOT 1.0 1.4* 1.3*  PROT 7.0 6.9 5.1*  ALBUMIN 3.6 3.7 2.6*      Radiology Studies: No results found.    LOS: 1 day    Joycelyn Das, MD [facility] Available via Epic secure chat 7am-7pm After these hours, please refer to coverage provider listed on [email or web address] 10/19/2022, 10:13 AM

## 2022-10-19 NOTE — Plan of Care (Signed)

## 2022-10-20 DIAGNOSIS — Z66 Do not resuscitate: Secondary | ICD-10-CM | POA: Diagnosis not present

## 2022-10-20 DIAGNOSIS — N179 Acute kidney failure, unspecified: Secondary | ICD-10-CM | POA: Diagnosis not present

## 2022-10-20 DIAGNOSIS — S12191A Other nondisplaced fracture of second cervical vertebra, initial encounter for closed fracture: Secondary | ICD-10-CM | POA: Diagnosis not present

## 2022-10-20 DIAGNOSIS — G894 Chronic pain syndrome: Secondary | ICD-10-CM | POA: Diagnosis not present

## 2022-10-20 LAB — CBC
HCT: 29.3 % — ABNORMAL LOW (ref 39.0–52.0)
Hemoglobin: 9.3 g/dL — ABNORMAL LOW (ref 13.0–17.0)
MCH: 29.9 pg (ref 26.0–34.0)
MCHC: 31.7 g/dL (ref 30.0–36.0)
MCV: 94.2 fL (ref 80.0–100.0)
Platelets: 142 10*3/uL — ABNORMAL LOW (ref 150–400)
RBC: 3.11 MIL/uL — ABNORMAL LOW (ref 4.22–5.81)
RDW: 17.1 % — ABNORMAL HIGH (ref 11.5–15.5)
WBC: 3.5 10*3/uL — ABNORMAL LOW (ref 4.0–10.5)
nRBC: 0 % (ref 0.0–0.2)

## 2022-10-20 LAB — BASIC METABOLIC PANEL
Anion gap: 10 (ref 5–15)
BUN: 21 mg/dL (ref 8–23)
CO2: 22 mmol/L (ref 22–32)
Calcium: 8.7 mg/dL — ABNORMAL LOW (ref 8.9–10.3)
Chloride: 109 mmol/L (ref 98–111)
Creatinine, Ser: 0.93 mg/dL (ref 0.61–1.24)
GFR, Estimated: >60 mL/min (ref >60)
Glucose, Bld: 119 mg/dL — ABNORMAL HIGH (ref 70–99)
Potassium: 3.7 mmol/L (ref 3.5–5.1)
Sodium: 141 mmol/L (ref 135–145)

## 2022-10-20 LAB — MAGNESIUM: Magnesium: 2 mg/dL (ref 1.7–2.4)

## 2022-10-20 NOTE — TOC Initial Note (Signed)
Transition of Care [facility]) - Initial/Assessment Note    Patient Details  Name: Aaron Gibbs MRN: [id number] Date of Birth: Sep 06, 1934  Transition of Care [facility]) CM/SW Contact:    Ralene Bathe, LCSWA Phone Number: 10/20/2022, 2:16 PM  Clinical Narrative:                 CSW received consult for possible SNF placement at time of discharge. CSW spoke with patient and patient's daughter. The family expressed understanding of PT recommendation and is agreeable to SNF placement at time of discharge. Patient reports preference for [facility]. CSW discussed insurance authorization process and will provide Medicare SNF ratings list. CSW will send out referrals for review and provide bed offers as available once patient is closer to being medically ready.   Skilled Nursing Rehab Facilities-   [email or web address]   Ratings out of 5 stars (5 the highest)   Name Address  Phone # Quality Care Staffing Health Inspection Overall  [facility] [address] [phone] 2 1 5 4   [facility] [address], [place] [phone] 4 1 3 2   [facility] [address], Ginette Otto [phone] 2 1 1 1   [facility] [address], [place] [phone] 4 1 3 2   [facility]  [address], [place] [phone] 3 2 5 5   [facility] [address], [facility] [phone] 2 1 2 1   [facility] [address] [place] [phone] 4 1 2 1   [facility] [address] [place] [phone] 2 4 3 3   [facility] ([facility]) [address] [place] [phone] 3 2 2 2   [facility] [address] [place], [place] [phone] 1 2 1 1   [name] [address], [place] [phone] 3 1 1 1   [facility] [address] Liliane Shi [phone] 4 3 4 4   [facility] [address] [place] [phone] 3 4 3 3           [facility] [address] [place] [phone]      [facility], [place] [address], [place] [phone] 1 1 2 1   [facility] [address] [place] [phone] 2 2 4 4   [facility] [address]itto [phone] 2 1 4 3   [facility] [address] [place] [phone] 3 3 3 3           [address] (no [facility]) [address], [place] [phone] 4 4 5 5   [facility] (No Hum[employer]) [address] [place] [phone] 2 2 4 4   [facility] [address], [place] [phone] 2 1 2 1   [facility] (No [facility]) [address], [place] [phone] 5 5 5 5   [facility] [address], [facility] [phone] 2 3 5 5   [facility] [address] [place] [phone] 2 1 1 1   [facility] [address] Liliane Shi [phone] 5 2 5 5   [facility]  [address] [place] [phone] 2 2 2 2   [facility] [address], [place] [phone] 4 2 1 1   [facility] [address] [place], [place] [phone] 2 2 3 3           [facility] [place] [facility] [address], [place] [phone] 1 1 1 1   [facility] [address] Evlyn Clines  [phone] 2 3 3 3   [facility] (No Humana) [address] [place] [phone] 2 1  3 2  [facility]) [address], Rosalita Levan [phone] 1 1 1 1   [facility] [address] [place]alita Levan [phone] 3 2 5 5   [facility] [address], [place] [phone] 2 1 1 1           [facility] [address] [place], [place] [phone] 4 4 5 5   [facility])  [address], [place] [phone] 2 1 2 1   [facility]) [address], [place] [phone]  1 4 3   [facility] [place] [address] [place] [phone] 3 5 4 5   [address] [place], [place] [phone] 3 2 2 2   [facility]) [address] [place]  [phone] 2 1 3 2     Expected Discharge Plan: Skilled Nursing Facility Barriers to Discharge: Continued Medical Work up, English as a second language teacher, SNF Pending bed offer   Patient Goals and CMS Choice   CMS Medicare.gov Compare Post Acute Care list provided to:: Patient Choice offered to / list presented to : Patient, Adult Children      Expected Discharge Plan and Services In-house Referral: Clinical Social Work     Living arrangements for the past 2 months: Single Family Home                                      Prior Living Arrangements/Services Living arrangements for the past 2 months: Single Family Home Lives with:: Other (Comment) (caregiver) Patient language and need for interpreter reviewed:: Yes Do you feel safe going back to the place where you live?: Yes      Need for Family Participation in Patient Care: Yes (Comment) Care giver support system in place?: Yes (comment)   Criminal Activity/Legal Involvement Pertinent to Current Situation/Hospitalization: No - Comment as needed  Activities of Daily Living      Permission Sought/Granted   Permission granted to share information with : Yes, Verbal Permission Granted     Permission granted to share info w AGENCY: SNFs        Emotional Assessment Appearance:: Appears stated age Attitude/Demeanor/Rapport: Engaged   Orientation: : Oriented to Place, Oriented to Self Alcohol / Substance Use: Not Applicable Psych Involvement: No (comment)  Admission diagnosis:  AKI (acute kidney injury) (HCC) [N17.9] Closed nondisplaced fracture of second cervical vertebra, unspecified fracture morphology, initial encounter (HCC) [S12.101A] COVID-19 [U07.1] C2 cervical fracture (HCC) [S12.100A] Patient Active Problem List   Diagnosis Date Noted   DNR (do not resuscitate) 10/17/2022   AKI (acute kidney injury) (HCC) 10/17/2022   C2 cervical fracture (HCC) 10/17/2022   Infrarenal abdominal aortic aneurysm (AAA)  without rupture (HCC) 10/17/2022   Hypokalemia 10/17/2022   COVID-19 10/11/2022   Thrombocytopenia (HCC) 10/11/2022   Chronic heart failure with preserved ejection fraction (HCC) 09/13/2022   Palliative care patient 07/17/2022   Other pancytopenia (HCC) 06/09/2022   Unable to maintain body in lying position 05/24/2022   Benign paroxysmal positional vertigo    Weakness 01/29/2022   Sensorineural hearing loss (SNHL) of both ears 07/25/2021   Pain in right knee 03/15/2021   Other chronic pain 03/15/2021   Chronic knee pain (Left) 09/28/2020   Abnormal MRI, knee (08/05/2018) (Left) 09/13/2020   Long-term current use of benzodiazepine 08/09/2020   Opioid dependence, binge pattern (HCC) 08/09/2020   Opioid dependence with or without physiological dependence (HCC) 08/08/2020  History of substance use disorder 04/15/2020   Osteoarthritis of knee (Left) 03/23/2020   Aortic atherosclerosis (HCC) 02/05/2020   Generalized anxiety disorder with panic attacks 02/04/2020   Depression with anxiety 12/13/2019   Depression 12/12/2019   COPD (chronic obstructive pulmonary disease) (HCC)    HLD (hyperlipidemia)    Iron deficiency anemia    Fall at home, initial encounter    Medial meniscal tear, sequela (Left) 06/10/2019   Patellar tendinosis (Right) 06/10/2019   Lateral meniscal tear, sequela (Left) 03/03/2019   Palpitation 02/13/2019   Preop testing 11/14/2018   Noncompliance with medication treatment due to overuse of medication 10/23/2018   Neurogenic pain 08/20/2018   Atherosclerotic peripheral vascular disease (HCC) 07/24/2018   Tricompartment osteoarthritis of knee (Left) 07/24/2018   Osteoarthritis of knee (Bilateral) 07/24/2018   Osteoarthritis of patellofemoral joint (Right) 07/24/2018   History of suicide attempt (06/12/18) 07/24/2018   Long term current use of opiate analgesic 07/10/2018   Pharmacologic therapy 07/10/2018   Disorder of skeletal system 07/10/2018   Problems  influencing health status 07/10/2018   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   Major depressive disorder, recurrent episode, severe (HCC) 06/04/2018   Blindness 05/10/2018   Chronic pain syndrome 05/01/2018   DISH (diffuse idiopathic skeletal hyperostosis) 05/01/2018   Osteoarthritis of multiple joints 05/01/2018   Chronic knee pain (1ry area of Pain) (Bilateral) (L>R) 05/01/2018   Retinitis pigmentosa of both eyes 05/01/2018   Glaucoma of both eyes 05/01/2018   Essential hypertension 05/01/2018   Chronic low back pain (Bilateral) w/o sciatica 05/01/2018   GERD (gastroesophageal reflux disease) 05/01/2018   AVM (arteriovenous malformation) of colon 05/01/2018   Therapeutic opioid-induced constipation (OIC) 05/01/2018   PCP:  Smitty Cords, DO Pharmacy:   [facility] [address], [place] - [address] [place] [address] Phone: [phone] Fax: [phone]     Social Determinants of Health (SDOH) Social History: SDOH Screenings   Food Insecurity: No Food Insecurity (10/11/2022)  Housing: Low Risk  (10/11/2022)  Transportation Needs: No Transportation Needs (10/11/2022)  Utilities: Not At Risk (10/11/2022)  Alcohol Screen: Low Risk  (07/13/2022)  Depression (PHQ2-9): Low Risk  (09/28/2022)  Recent Concern: Depression (PHQ2-9) - High Risk (09/13/2022)  Financial Resource Strain: Low Risk  (07/13/2022)  Physical Activity: Inactive (07/13/2022)  Social Connections: Socially Isolated (07/13/2022)  Stress: Stress Concern Present (07/13/2022)  Tobacco Use: High Risk (10/17/2022)   SDOH Interventions:     Readmission Risk Interventions     No data to display

## 2022-10-20 NOTE — Progress Notes (Signed)
RN faxed request and authorization for medical records to [place] [facility] to fax number [phone]

## 2022-10-20 NOTE — Progress Notes (Signed)
OT Cancellation Note  Patient Details Name: Aaron Gibbs MRN: [id number] DOB: [birth year]   Cancelled Treatment:    Reason Eval/Treat Not Completed: Pain limiting ability to participate (Attempted to see pt for skilled OT session. Pt refused OT secondary to pain. Pt and family member present were educated regarding importance of pt sitting up and participation in skilled therapy services. Will attempt again this day as time allows.)  [name] "[name]" M., OTR/L, MA Acute Rehab [phone]   Lendon Colonel 10/20/2022, 3:32 PM

## 2022-10-20 NOTE — Plan of Care (Signed)

## 2022-10-20 NOTE — Progress Notes (Signed)
Pt stated he wants to leave his cervical collar off for a little while longer.

## 2022-10-20 NOTE — [place] FL2 (Signed)
[place] MEDICAID FL2 LEVEL OF CARE FORM     IDENTIFICATION  Patient Name: Aaron Gibbs Birthdate: Dec 19, 1934 Sex: male Admission Date (Current Location): 10/17/2022  [facility] and [place] Number:  Producer, television/film/video and Address:  [facility] [place], [address] [place], [place] [address]      Provider Number: [id number]  Attending Physician Name and Address:  Joycelyn Das, MD  Relative Name and Phone Number:  Kobin, Detoro (Daughter) [phone]    Current Level of Care: Hospital Recommended Level of Care: Skilled Nursing Facility Prior Approval Number:    Date Approved/Denied:   PASRR Number: [id number]  Discharge Plan: SNF    Current Diagnoses: Patient Active Problem List   Diagnosis Date Noted   DNR (do not resuscitate) 10/17/2022   AKI (acute kidney injury) (HCC) 10/17/2022   C2 cervical fracture (HCC) 10/17/2022   Infrarenal abdominal aortic aneurysm (AAA) without rupture (HCC) 10/17/2022   Hypokalemia 10/17/2022   COVID-19 10/11/2022   Thrombocytopenia (HCC) 10/11/2022   Chronic heart failure with preserved ejection fraction (HCC) 09/13/2022   Palliative care patient 07/17/2022   Other pancytopenia (HCC) 06/09/2022   Unable to maintain body in lying position 05/24/2022   Benign paroxysmal positional vertigo    Weakness 01/29/2022   Sensorineural hearing loss (SNHL) of both ears 07/25/2021   Pain in right knee 03/15/2021   Other chronic pain 03/15/2021   Chronic knee pain (Left) 09/28/2020   Abnormal MRI, knee (08/05/2018) (Left) 09/13/2020   Long-term current use of benzodiazepine 08/09/2020   Opioid dependence, binge pattern (HCC) 08/09/2020   Opioid dependence with or without physiological dependence (HCC) 08/08/2020   History of substance use disorder 04/15/2020   Osteoarthritis of knee (Left) 03/23/2020   Aortic atherosclerosis (HCC) 02/05/2020   Generalized anxiety disorder with panic attacks 02/04/2020   Depression  with anxiety 12/13/2019   Depression 12/12/2019   COPD (chronic obstructive pulmonary disease) (HCC)    HLD (hyperlipidemia)    Iron deficiency anemia    Fall at home, initial encounter    Medial meniscal tear, sequela (Left) 06/10/2019   Patellar tendinosis (Right) 06/10/2019   Lateral meniscal tear, sequela (Left) 03/03/2019   Palpitation 02/13/2019   Preop testing 11/14/2018   Noncompliance with medication treatment due to overuse of medication 10/23/2018   Neurogenic pain 08/20/2018   Atherosclerotic peripheral vascular disease (HCC) 07/24/2018   Tricompartment osteoarthritis of knee (Left) 07/24/2018   Osteoarthritis of knee (Bilateral) 07/24/2018   Osteoarthritis of patellofemoral joint (Right) 07/24/2018   History of suicide attempt (06/12/18) 07/24/2018   Long term current use of opiate analgesic 07/10/2018   Pharmacologic therapy 07/10/2018   Disorder of skeletal system 07/10/2018   Problems influencing health status 07/10/2018   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   Major depressive disorder, recurrent episode, severe (HCC) 06/04/2018   Blindness 05/10/2018   Chronic pain syndrome 05/01/2018   DISH (diffuse idiopathic skeletal hyperostosis) 05/01/2018   Osteoarthritis of multiple joints 05/01/2018   Chronic knee pain (1ry area of Pain) (Bilateral) (L>R) 05/01/2018   Retinitis pigmentosa of both eyes 05/01/2018   Glaucoma of both eyes 05/01/2018   Essential hypertension 05/01/2018   Chronic low back pain (Bilateral) w/o sciatica 05/01/2018   GERD (gastroesophageal reflux disease) 05/01/2018   AVM (arteriovenous malformation) of colon 05/01/2018   Therapeutic opioid-induced constipation (OIC) 05/01/2018    Orientation RESPIRATION BLADDER Height & Weight     Place, Self  Normal Incontinent, External catheter Weight: 218 lb 4.1 oz (99 kg) Height:  5\' 10"  (177.8 cm)  BEHAVIORAL SYMPTOMS/MOOD NEUROLOGICAL BOWEL NUTRITION STATUS      Continent Diet (see discharge  summary)  AMBULATORY STATUS COMMUNICATION OF NEEDS Skin   Total Care Verbally Normal                       Personal Care Assistance Level of Assistance  Bathing, Feeding, Dressing Bathing Assistance: Maximum assistance Feeding assistance: Limited assistance Dressing Assistance: Maximum assistance     Functional Limitations Info  Sight, Hearing, Speech Sight Info: Impaired Hearing Info: Adequate Speech Info: Adequate    SPECIAL CARE FACTORS FREQUENCY  PT (By licensed PT), OT (By licensed OT)     PT Frequency: 5x/ week OT Frequency: 5x/ week            Contractures Contractures Info: Not present    Additional Factors Info  Code Status, Psychotropic, Allergies Code Status Info: DNR Allergies Info: Anthralin  Azithromycin Psychotropic Info: OLANZapine (ZYPREXA) tablet 2.5 mg         Current Medications (10/20/2022):  This is the current hospital active medication list Current Facility-Administered Medications  Medication Dose Route Frequency Provider Last Rate Last Admin   acetaminophen (TYLENOL) tablet 650 mg  650 mg Oral Q6H PRN Carollee Herter, DO       Or   acetaminophen (TYLENOL) suppository 650 mg  650 mg Rectal Q6H PRN Carollee Herter, DO       acetaminophen (TYLENOL) tablet 650 mg  650 mg Oral TID Barbara Cower, NP   650 mg at 10/20/22 0746   buprenorphine (BUTRANS) 10 MCG/HR 1 patch  1 patch Transdermal Weekly Carollee Herter, DO   1 patch at 10/18/22 1610   carvedilol (COREG) tablet 12.5 mg  12.5 mg Oral BID Carollee Herter, DO   12.5 mg at 10/20/22 0747   DULoxetine (CYMBALTA) DR capsule 90 mg  90 mg Oral Daily Carollee Herter, DO   90 mg at 10/20/22 0747   feeding supplement (ENSURE ENLIVE / ENSURE PLUS) liquid 237 mL  237 mL Oral TID AC & HS Barbara Cower, NP   237 mL at 10/20/22 1138   finasteride (PROSCAR) tablet 5 mg  5 mg Oral Daily Carollee Herter, DO   5 mg at 10/20/22 0747   gabapentin (NEURONTIN) capsule 300 mg  300 mg Oral Daily Carollee Herter, DO   300 mg at 10/20/22 0746    HYDROmorphone (DILAUDID) injection 0.5-1 mg  0.5-1 mg Intravenous Q2H PRN Carollee Herter, DO   1 mg at 10/20/22 0745   iohexol (OMNIPAQUE) 350 MG/ML injection 75 mL  75 mL Intravenous Once PRN Carollee Herter, DO       LORazepam (ATIVAN) tablet 0.5 mg  0.5 mg Oral BID PRN Dow Adolph N, DO   0.5 mg at 10/18/22 0559   OLANZapine (ZYPREXA) tablet 2.5 mg  2.5 mg Oral BID Carollee Herter, DO   2.5 mg at 10/20/22 0755   ondansetron (ZOFRAN) tablet 4 mg  4 mg Oral Q6H PRN Carollee Herter, DO       Or   ondansetron [facility]) injection 4 mg  4 mg Intravenous Q6H PRN Carollee Herter, DO       Oral care mouth rinse  15 mL Mouth Rinse PRN Pokhrel, Laxman, MD       oxyCODONE (Oxy IR/ROXICODONE) immediate release tablet 5 mg  5 mg Oral Q4H PRN Carollee Herter, DO   5 mg at 10/20/22 0556   tamsulosin (FLOMAX) capsule 0.4 mg  0.4 mg Oral Daily Carollee Herter, DO   0.4 mg at 10/20/22 0745   traZODone (DESYREL) tablet 150 mg  150 mg Oral QHS PRN Carollee Herter, DO   150 mg at 10/17/22 2148   vancomycin (VANCOREADY) IVPB 1500 mg/300 mL  1,500 mg Intravenous Q24H Lodema Hong A, RPH 150 mL/hr at 10/20/22 0748 1,500 mg at 10/20/22 8182     Discharge Medications: Please see discharge summary for a list of discharge medications.  Relevant Imaging Results:  Relevant Lab Results:   Additional Information SS# [id number], LCSWA

## 2022-10-20 NOTE — Progress Notes (Signed)
PROGRESS NOTE    Aaron Gibbs  ZOX:[id number] DOB: 1934/10/04 DOA: 10/17/2022 PCP: Smitty Cords, DO    Brief Narrative:   88 year old male with past medical history of blindness, chronic pain on narcotics, COPD, depression, GERD, hypertension who was recently discharged from [facility] regional hospital yesterday presented to our hospital after sustaining a fall in the middle of the night.  Patient was brought into the hospital by EMS.  He has had 2 falls at home since discharge.  Of note patient was recently admitted to [facility] hospital on 10/11/2022 for COVID-19 infection.  In the ED, patient was mildly febrile, tachycardic.  Initial labs showed creatinine of 1.9.  COVID test was positive.  WBC mildly elevated at 11.6.  CT head was negative for acute abnormality.  CT C-spine showed displaced C2 fracture of the base of   Radiology recommended CTA of the neck for assessment of right vertebral artery.  CT scan of the abdomen and pelvis showed partially thrombosed infrarenal abdominal aortic aneurysm of 5 x 4.3 cm.  Neurosurgery was then consulted and patient was admitted hospital for acute kidney injury, C-spine fracture.    Assessment and Plan:  AKI (acute kidney injury)  Initial creatinine of 1.9.  Improved with Ringer lactate.    Creatinine of 0.9 today.  Hypokalemia.  Improved after replacement.  Potassium of 3.7 today.  C2 cervical fracture  Neurosurgery was consulted.  CT angiogram of the neck showed cervical fracture.   Radiology recommended a CT angiogram of the neck for assessment of vertebral artery.  At this time, the neurosurgery has recommended no surgical intervention and soft collar.  Communicated with neurosurgery on 10/19/2022, will likely need soft collar for 6 weeks.     COVID-19 infection. Stable on room air.  Asymptomatic.  No need for treatment.  Lactic acidosis.  Present on admission.  Could be secondary to infection.  Received IV fluids.  On IV  antibiotic vancomycin..  Gram-positive bacteremia.  Blood cultures with Staphylococcus species in 3 out of 4 bottles vancomycin has been added to the regimen.  Cultures showing Staph epidermidis and hominis at this time.  Procalcitonin less than 0.1.  Lactate was elevated at 2.7.  Temperature max of 98.1 F.   Fall at home, initial encounter Patient with frequent falls.  Palliative care on board for goals of care.  Pending PT, OT evaluation.   Chronic heart failure with preserved ejection fraction (HCC) Continue Coreg.  Currently Bumex on hold.  Compensated at this time.   COPD (chronic obstructive pulmonary disease) (HCC) Continue bronchodilators as needed.  Currently stable.  On room air.   Essential hypertension On Coreg twice daily.  Hold Bumex due to AKI.  Slightly elevated blood pressure.   Chronic pain syndrome On buprenorphine patch 10 mcg/h.  Risk of falls due to pain medication.   Hypokalemia Potassium 3.5 today.  IV potassium was replenished.  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  Was recently diagnosed at [facility].  Plan was outpatient vascular surgery follow-up/CT MRI in 6 months.  FOBT positive. Initial hemoglobin of 11.4.  Repeated hemoglobin  at 9.0.  No  obvious blood loss reported to me.  Ferritin at 38, serum iron 36.  Scalp Lesion likely melanoma.  Has had biopsy done at [facility] dermatology.  I have asked for biopsy report.  Family wishes to discuss this with oncology.   DVT prophylaxis: SCDs Start: 10/17/22 8119  Code Status:     Code Status: DNR  Disposition:  Uncertain at this time.  PT evaluation pending, pending clinical improvement  Status is: Inpatient  The patient is inpatient because: C2 fracture, deconditioning, debility,  gram-positive bacteremia   Family Communication:   I again spoke with the patient's daughter Ms. [name] on the phone and updated  her about the clinical condition of the patient on 10/20/2022.  Consultants:  Neurosurgery Palliative care  Procedures:  Cervical collar  Antimicrobials:  Vancomycin IV  Anti-infectives (From admission, onward)    Start     Dose/Rate Route Frequency Ordered Stop   10/18/22 0900  vancomycin (VANCOREADY) IVPB 1500 mg/300 mL        1,500 mg 150 mL/hr over 120 Minutes Intravenous Every 24 hours 10/18/22 0813     10/17/22 2144  vancomycin variable dose per unstable renal function (pharmacist dosing)  Status:  Discontinued         Does not apply See admin instructions 10/17/22 2144 10/18/22 0817   10/17/22 0300  vancomycin (VANCOREADY) IVPB 2000 mg/400 mL        2,000 mg 200 mL/hr over 120 Minutes Intravenous  Once 10/17/22 0249 10/17/22 0815   10/17/22 0245  ceFEPIme (MAXIPIME) 2 g in sodium chloride 0.9 % 100 mL IVPB        2 g 200 mL/hr over 30 Minutes Intravenous  Once 10/17/22 0242 10/17/22 0535   10/17/22 0245  metroNIDAZOLE (FLAGYL) IVPB 500 mg        500 mg 100 mL/hr over 60 Minutes Intravenous  Once 10/17/22 0242 10/17/22 0430   10/17/22 0245  vancomycin (VANCOCIN) IVPB 1000 mg/200 mL premix  Status:  Discontinued        1,000 mg 200 mL/hr over 60 Minutes Intravenous  Once 10/17/22 0242 10/17/22 0249      Subjective:  Today, patient was seen and examined at bedside.  He is more alert awake and Communicative at this time.  Denies any pain, nausea, vomiting, fever, chills or rigors.  Patient's daughter wishes for staples to be removed from the biopsy site.  Objective: Vitals:   10/19/22 1751 10/19/22 1951 10/20/22 0526 10/20/22 1014  BP: (!) 107/41 (!) 140/66 (!) 161/75 (!) 167/77  Pulse: 63 64 76 74  Resp: 19 18 18    Temp: 97.9 F (36.6 C) 98.3 F (36.8 C) 98 F (36.7 C) 98.3 F (36.8 C)  TempSrc: Oral Oral Oral Oral  SpO2: 94% 94% 95% 96%  Weight:   99 kg   Height:        Intake/Output Summary (Last 24 hours) at 10/20/2022 1021 Last data filed at 10/20/2022  0500 Gross per 24 hour  Intake 120 ml  Output 800 ml  Net -680 ml    Filed Weights   10/17/22 1100 10/19/22 0500 10/20/22 0526  Weight: 91.7 kg 91.7 kg 99 kg    Physical Examination: Body mass index is 31.32 kg/m.   General: Soft cervical collar in place, elderly male, more alert awake and Communicative. HENT:   No scleral pallor or icterus noted. Oral mucosa is moist.  Blindness. Chest:   Diminished breath sounds bilaterally. No crackles or wheezes.  CVS: S1 &S2 heard. No murmur.  Regular rate and rhythm. Abdomen: Soft, nontender, nondistended.  Bowel sounds are heard.   Extremities: No cyanosis, clubbing or edema.  Peripheral pulses are palpable. Psych: Alert awake and Communicative. CNS: Moving all extremities, alert awake and Communicative, cervical collar in place.   Skin:  Scalp forehead skin cancer.  Data Reviewed:   CBC: Recent Labs  Lab 10/14/22 0638 10/17/22 0141 10/17/22 0232 10/18/22 0431 10/19/22 0422 10/20/22 0224  WBC 6.7 11.6*  --  4.3 3.7* 3.5*  NEUTROABS 3.2 6.2  --  1.9  --   --   HGB 11.1* 11.4* 10.2* 8.6* 9.0* 9.3*  HCT 34.2* 36.4* 30.0* 26.4* 28.1* 29.3*  MCV 90.0 94.3  --  92.3 94.6 94.2  PLT 144* 201  --  126* 134* 142*     Basic Metabolic Panel: Recent Labs  Lab 10/14/22 0638 10/17/22 0141 10/17/22 0232 10/18/22 0431 10/19/22 0422 10/20/22 0224  NA 144 143 144 140 140 141  K 3.8 3.1* 3.4* 3.1* 3.5 3.7  CL 111 108  --  111 111 109  CO2 24 22  --  22 25 22   GLUCOSE 113* 138*  --  103* 104* 119*  BUN 30* 45*  --  25* 22 21  CREATININE 1.16 1.90*  --  1.04 1.08 0.93  CALCIUM 9.1 9.3  --  8.1* 8.4* 8.7*  MG  --   --   --  1.9 2.1 2.0     Liver Function Tests: Recent Labs  Lab 10/14/22 0638 10/17/22 0141 10/18/22 0431  AST 33 39 24  ALT 31 40 26  ALKPHOS 51 54 41  BILITOT 1.0 1.4* 1.3*  PROT 7.0 6.9 5.1*  ALBUMIN 3.6 3.7 2.6*      Radiology Studies: No results found.    LOS: 2 days    Joycelyn Das, MD [facility] Available via Epic secure chat 7am-7pm After these hours, please refer to coverage provider listed on [email or web address] 10/20/2022, 10:21 AM

## 2022-10-20 NOTE — Progress Notes (Signed)
Speech Language Pathology Treatment: Dysphagia  Patient Details Name: Aaron Gibbs MRN: [id number] DOB: 10/04/1934 Today's Date: 10/20/2022 Time: 7829-5621 SLP Time Calculation (min) (ACUTE ONLY): 15 min  Assessment / Plan / Recommendation Clinical Impression  Pt would not allow upright positioning due to pain and he accepted one sip of water and refused solids stating "all I want is Ensure."  Majority of session speaking with pt's daughter at bedside. She states he has a history of swallowing problems and has had tests and EGD- no prior notes seen. Daughter states he will not eat any solids presently and have requested to have Ensure for all meals and snacks. Prior to hospitalization he was eating and drinking regular foods/liquids. Daughter stated if pt was to accept po's in next few days he is more likely to consume regular versus puree- SLP in agreement and will upgrade to regular texture, continue thin, pills whole in puree. SLP will continue to attempt to observe with solids texture as able.    HPI HPI: Patient is an 88 y.o. male with PMH: blindness, chronic pain on chronic narcotics, COPD, depression, GERD, HTN. He was discharged home from [facility] on 10/16/22 (had been admitted since 4/24 with COVID-19) where he has 24/7 caregiver assistance. He presented to [facility] ER on 10/17/22 after a fall and per EMS report, patient has had two falls since discharge home. In ER patient was very agitated and required IV sedation. CT head negative for acute intracranial abnormality. C-spine CT showed minimally displaced C2 fracture of the base of the odontoid. SLP swallow evaluation ordered on 10/18/22 as patient was coughing when taking pills. Pt with suspicion of new cancer (skin?) and biopsy taken at [facility] prior to this admit.       SLP Plan  Continue with current plan of care      Recommendations for follow up therapy are one component of a multi-disciplinary discharge planning process, led by the  attending physician.  Recommendations may be updated based on patient status, additional functional criteria and insurance authorization.    Recommendations  Diet recommendations: Regular;Thin liquid Liquids provided via: Straw Medication Administration: Crushed with puree Supervision: Staff to assist with self feeding Compensations: Slow rate;Small sips/bites Postural Changes and/or Swallow Maneuvers: Seated upright 90 degrees                  Oral care BID   Frequent or constant Supervision/Assistance Dysphagia, unspecified (R13.10)     Continue with current plan of care     Royce Macadamia  10/20/2022, 10:38 AM

## 2022-10-21 DIAGNOSIS — W19XXXA Unspecified fall, initial encounter: Secondary | ICD-10-CM | POA: Diagnosis not present

## 2022-10-21 DIAGNOSIS — N179 Acute kidney failure, unspecified: Secondary | ICD-10-CM | POA: Diagnosis not present

## 2022-10-21 DIAGNOSIS — S12191A Other nondisplaced fracture of second cervical vertebra, initial encounter for closed fracture: Secondary | ICD-10-CM | POA: Diagnosis not present

## 2022-10-21 DIAGNOSIS — U071 COVID-19: Secondary | ICD-10-CM | POA: Diagnosis not present

## 2022-10-21 MED ORDER — HYDRALAZINE HCL 20 MG/ML IJ SOLN: 10.0000 mg | Freq: Four times a day (QID) | INTRAMUSCULAR | Status: DC | PRN

## 2022-10-21 NOTE — Progress Notes (Signed)
PROGRESS NOTE    Aaron Gibbs  ZOX:[id number] DOB: 1935-04-16 DOA: 10/17/2022 PCP: Smitty Cords, DO    Brief Narrative:   88 year old male with past medical history of blindness, chronic pain on narcotics, COPD, depression, GERD, hypertension who was recently discharged from [facility] hospital yesterday presented to our hospital after sustaining a fall in the middle of the night.  Patient was brought into the hospital by EMS.  He has had 2 falls at home since discharge.  Of note patient was recently admitted to [facility] hospital on 10/11/2022 for COVID-19 infection.  In the ED, patient was mildly febrile, tachycardic.  Initial labs showed creatinine of 1.9.  COVID test was positive.  WBC mildly elevated at 11.6.  CT head was negative for acute abnormality.  CT C-spine showed displaced C2 fracture of the base of    CT scan of the abdomen and pelvis showed partially thrombosed infrarenal abdominal aortic aneurysm of 5 x 4.3 cm.  Neurosurgery was then consulted and patient was admitted hospital for acute kidney injury, C-spine fracture.    Assessment and Plan:  AKI (acute kidney injury)  Initial creatinine of 1.9.  Improved with Ringer lactate.    Creatinine of 0.9 today.  Hypokalemia.  Improved after replacement.  Potassium of 3.7 today.  C2 cervical fracture  Neurosurgery was consulted.  CT angiogram of the neck showed cervical fracture.   Radiology recommended a CT angiogram of the neck for assessment of vertebral artery.  At this time, the neurosurgery has recommended no surgical intervention and soft collar and likely need soft collar for 6 weeks.     COVID-19 infection. Stable on room air.  Asymptomatic.  No need for treatment.  Lactic acidosis.  Present on admission.  Could be secondary to infection.  Initially received IV fluids.    Gram-positive bacteremia.  Blood cultures with Staphylococcus species in 3 out of 4 bottles vancomycin has been added to  the regimen.  Cultures showing Staph epidermidis and hominis at this time.  Procalcitonin less than 0.1.  Lactate was elevated at 2.7.  Temperature max of 98.1 F.  On IV vancomycin.   Fall at home, initial encounter Patient with frequent falls.  Palliative care on board for goals of care.  Physical therapy has recommended skilled nursing facility placement at this time.   Chronic heart failure with preserved ejection fraction (HCC) Continue Coreg.  Currently Bumex on hold.  Compensated at this time.   COPD (chronic obstructive pulmonary disease) (HCC) Continue bronchodilators as needed.  Currently stable.  On room air.   Essential hypertension On Coreg twice daily.  Continue to hold Bumex due to AKI.  Slightly elevated blood pressure.  Add needed hydralazine.   Chronic pain syndrome On buprenorphine patch 10 mcg/h.  Risk of falls due to pain medication.   Hypokalemia Potassium 3.5 today.  Had received IV potassium.  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  Was recently diagnosed at [facility].  Plan is  outpatient vascular surgery follow-up/CT MRI in 6 months.  FOBT positive. Initial hemoglobin of 11.4.  Repeated hemoglobin  at 9.3.  No  obvious blood loss reported to me.  Ferritin at 38, serum iron 36.  Scalp Lesion likely melanoma.   Has had biopsy done at [facility] dermatology, pending biopsy report.  Family wishes to discuss this with oncology.   DVT prophylaxis: SCDs Start: 10/17/22 0537   Code Status:  Code Status: DNR  Disposition:  Uncertain at this time.  Continue to skilled nursing facility.  Status is: Inpatient  The patient is inpatient because: C2 fracture, deconditioning, debility,  gram-positive bacteremia on IV antibiotic, need for rehabilitation.   Family Communication:   I again spoke with the patient's daughter Ms. [name] on the phone and updated her about the clinical  condition of the patient on 10/20/2022.  Consultants:  Neurosurgery Palliative care  Procedures:  Cervical collar  Antimicrobials:  Vancomycin IV  Anti-infectives (From admission, onward)    Start     Dose/Rate Route Frequency Ordered Stop   10/18/22 0900  vancomycin (VANCOREADY) IVPB 1500 mg/300 mL        1,500 mg 150 mL/hr over 120 Minutes Intravenous Every 24 hours 10/18/22 0813     10/17/22 2144  vancomycin variable dose per unstable renal function (pharmacist dosing)  Status:  Discontinued         Does not apply See admin instructions 10/17/22 2144 10/18/22 0817   10/17/22 0300  vancomycin (VANCOREADY) IVPB 2000 mg/400 mL        2,000 mg 200 mL/hr over 120 Minutes Intravenous  Once 10/17/22 0249 10/17/22 0815   10/17/22 0245  ceFEPIme (MAXIPIME) 2 g in sodium chloride 0.9 % 100 mL IVPB        2 g 200 mL/hr over 30 Minutes Intravenous  Once 10/17/22 0242 10/17/22 0535   10/17/22 0245  metroNIDAZOLE (FLAGYL) IVPB 500 mg        500 mg 100 mL/hr over 60 Minutes Intravenous  Once 10/17/22 0242 10/17/22 0430   10/17/22 0245  vancomycin (VANCOCIN) IVPB 1000 mg/200 mL premix  Status:  Discontinued        1,000 mg 200 mL/hr over 60 Minutes Intravenous  Once 10/17/22 0242 10/17/22 0249      Subjective:  Today, patient was seen and examined at bedside.  Denies any nausea vomiting fever but still has some neck pain.  No other interval complaints.  Objective: Vitals:   10/20/22 1710 10/20/22 1926 10/21/22 0509 10/21/22 0859  BP: (!) 122/56 (!) 142/56 (!) 157/65 (!) 155/73  Pulse: 69 66 (!) 58 69  Resp:  19 18 17   Temp: (!) 97.5 F (36.4 C) 98.7 F (37.1 C) 99.5 F (37.5 C) 97.8 F (36.6 C)  TempSrc: Oral Oral Oral Oral  SpO2: 96% 100% 95% 96%  Weight:      Height:        Intake/Output Summary (Last 24 hours) at 10/21/2022 1135 Last data filed at 10/21/2022 0900 Gross per 24 hour  Intake 1017 ml  Output 500 ml  Net 517 ml    Filed Weights   10/17/22 1100 10/19/22  0500 10/20/22 0526  Weight: 91.7 kg 91.7 kg 99 kg    Physical Examination: Body mass index is 31.32 kg/m.   General: Alert awake and Communicative, obese, cervical collar in place,  elderly male, HENT:   No scleral pallor or icterus noted. Oral mucosa is moist.  Blindness.  Cervical collar in place. Chest:   Diminished breath sounds bilaterally. No crackles or wheezes.  CVS: S1 &S2 heard. No murmur.  Regular rate and rhythm. Abdomen: Soft, nontender, nondistended.  Bowel sounds are heard.   Extremities: No cyanosis, clubbing or edema.  Peripheral pulses are palpable. Psych: Alert awake and Communicative. CNS: Moving all extremities, alert awake and Communicative, cervical collar in place.   Skin: Scalp forehead skin cancer.  Data Reviewed:   CBC: Recent Labs  Lab 10/17/22 0141 10/17/22 0232 10/18/22 0431 10/19/22 0422 10/20/22 0224  WBC 11.6*  --  4.3 3.7* 3.5*  NEUTROABS 6.2  --  1.9  --   --   HGB 11.4* 10.2* 8.6* 9.0* 9.3*  HCT 36.4* 30.0* 26.4* 28.1* 29.3*  MCV 94.3  --  92.3 94.6 94.2  PLT 201  --  126* 134* 142*     Basic Metabolic Panel: Recent Labs  Lab 10/17/22 0141 10/17/22 0232 10/18/22 0431 10/19/22 0422 10/20/22 0224  NA 143 144 140 140 141  K 3.1* 3.4* 3.1* 3.5 3.7  CL 108  --  111 111 109  CO2 22  --  22 25 22   GLUCOSE 138*  --  103* 104* 119*  BUN 45*  --  25* 22 21  CREATININE 1.90*  --  1.04 1.08 0.93  CALCIUM 9.3  --  8.1* 8.4* 8.7*  MG  --   --  1.9 2.1 2.0     Liver Function Tests: Recent Labs  Lab 10/17/22 0141 10/18/22 0431  AST 39 24  ALT 40 26  ALKPHOS 54 41  BILITOT 1.4* 1.3*  PROT 6.9 5.1*  ALBUMIN 3.7 2.6*      Radiology Studies: No results found.    LOS: 3 days    Joycelyn Das, MD [facility] Available via Epic secure chat 7am-7pm After these hours, please refer to coverage provider listed on [email or web address] 10/21/2022, 11:35 AM

## 2022-10-21 NOTE — Progress Notes (Signed)
Palliative Medicine Inpatient Follow Up Note HPI:  88 year old male with past medical history of blindness, chronic pain on narcotics, COPD, depression, GERD, hypertension who was recently discharged from [facility] hospital yesterday presented to our hospital after sustaining a fall in the middle of the night.  Patient was brought into the hospital by EMS.  He has had 2 falls at home since discharge.   Palliative care has been asked to get involved to help establish goals of care in the setting of chronic comorbid conditions and overall declining health.  Today's Discussion 10/21/2022  *Please note that this is a verbal dictation therefore any spelling or grammatical errors are due to the "Dragon Medical One" system interpretation.  Chart reviewed inclusive of vital signs, progress notes, laboratory results, and diagnostic images.   I met with Aaron Gibbs at bedside this afternoon. He was joined by his Charity fundraiser, Aaron Gibbs. Per conversation with Aaron Gibbs patient had 30/10 neck and back pain. Upon reassessment with me present patient reports 6-7/10 pain.   Aaron Gibbs requested some milk which I was able to give him. He did cough though appeared to be able to clear well. Per speech therapy notes he was evaluated yesterday ad passed.  I called patients daughter, Aaron Gibbs who plans to bring in the biopsy results from the dermatology clinic. Once these are obtained she is hopeful the Oncology team can discuss possible options in terms of treatment.   In terms of Aaron Gibbs swallow - this is well known to Aaron Gibbs. She states that he chronically coughs with ingestion and has a narrow esophagus. Aaron Gibbs does not presently see the utility in doing a more advanced workup.   Aaron Gibbs shares that she has given a great deal of thought to the long term regarding Aaron Gibbs care. She is now at a point whereby she would like to pursue rehab but afterwards she feels Aaron Gibbs will need full time care at a nursing home. Aaron Gibbs does not feel  that the aid Aaron Gibbs has presently will be able to provide what is needed considering his debilities. I shared that I would have the MSW team reach out so that she may learn more about that process.  Questions and concerns addressed/Palliative Support Provided.   Objective Assessment: Vital Signs Vitals:   10/21/22 0509 10/21/22 0859  BP: (!) 157/65 (!) 155/73  Pulse: (!) 58 69  Resp: 18 17  Temp: 99.5 F (37.5 C) 97.8 F (36.6 C)  SpO2: 95% 96%    Intake/Output Summary (Last 24 hours) at 10/21/2022 1554 Last data filed at 10/21/2022 1300 Gross per 24 hour  Intake 900 ml  Output 500 ml  Net 400 ml    Last Weight  Most recent update: 10/20/2022  6:02 AM    Weight  99 kg (218 lb 4.1 oz)            Gen: Elderly chronically ill-appearing Caucasian male in no acute distress HEENT: Dry mucous membranes CV: Regular rate and rhythm PULM: On room air breathing is even and unlabored ABD: soft/nontender EXT: RLE edema Neuro: Alert and oriented x3  SUMMARY OF RECOMMENDATIONS   DNAR/DNI  Patient's daughter, Aaron Gibbs shared that Aaron Gibbs has malignant melanoma per their dermatologist --> Patient see's [facility]. Aaron Gibbs plans to bring in biopsy results today (Saturday)  Aaron Gibbs would like an oncology consultation to better identify options  Aaron Gibbs shares patient will need short term rehab and long term placement thereafter  Ongoing palliative care support  Billing based on MDM: High  ______________________________________________________________________________________ Aaron Gibbs  [facility] Palliative Medicine Team Team Cell Phone: [phone] Please utilize secure chat with additional questions, if there is no response within 30 minutes please call the above phone number  Palliative Medicine Team providers are available by phone from 7am to 7pm daily and can be reached through the team cell phone.  Should this patient require assistance outside of these hours,  please call the patient's attending physician.

## 2022-10-22 DIAGNOSIS — U071 COVID-19: Secondary | ICD-10-CM | POA: Diagnosis not present

## 2022-10-22 DIAGNOSIS — N179 Acute kidney failure, unspecified: Secondary | ICD-10-CM | POA: Diagnosis not present

## 2022-10-22 DIAGNOSIS — W19XXXA Unspecified fall, initial encounter: Secondary | ICD-10-CM | POA: Diagnosis not present

## 2022-10-22 DIAGNOSIS — S12191A Other nondisplaced fracture of second cervical vertebra, initial encounter for closed fracture: Secondary | ICD-10-CM | POA: Diagnosis not present

## 2022-10-22 LAB — BASIC METABOLIC PANEL
Anion gap: 4 — ABNORMAL LOW (ref 5–15)
BUN: 15 mg/dL (ref 8–23)
CO2: 23 mmol/L (ref 22–32)
Calcium: 8.6 mg/dL — ABNORMAL LOW (ref 8.9–10.3)
Chloride: 115 mmol/L — ABNORMAL HIGH (ref 98–111)
Creatinine, Ser: 0.83 mg/dL (ref 0.61–1.24)
GFR, Estimated: >60 mL/min (ref >60)
Glucose, Bld: 114 mg/dL — ABNORMAL HIGH (ref 70–99)
Potassium: 3.8 mmol/L (ref 3.5–5.1)
Sodium: 142 mmol/L (ref 135–145)

## 2022-10-22 NOTE — Progress Notes (Signed)
Palliative Medicine Inpatient Follow Up Note HPI:  88 year old male with past medical history of blindness, chronic pain on narcotics, COPD, depression, GERD, hypertension who was recently discharged from [facility] yesterday presented to our hospital after sustaining a fall in the middle of the night.  Patient was brought into the hospital by EMS.  He has had 2 falls at home since discharge.   Palliative care has been asked to get involved to help establish goals of care in the setting of chronic comorbid conditions and overall declining health.  Today's Discussion 10/22/2022  *Please note that this is a verbal dictation therefore any spelling or grammatical errors are due to the "Dragon Medical One" system interpretation.  Chart reviewed inclusive of vital signs, progress notes, laboratory results, and diagnostic images.   I met with Aaron Gibbs at bedside this morning.  He was joined by his friend and caregiver Aaron Gibbs with whom he lives.  We discussed patient's present clinical state and Aaron Gibbs expresses that she does not feel she can care for him unless he is able to stand up and mobilize appropriately.  We reviewed that it may be safest for him to have long-term placement in a nursing home to provide the level of support he will need.  Aaron Gibbs also asked why patient's pain is so elevated and she shares he is seeing a pain management specialist for his chronic pain syndrome - Dr. Laban Emperor. She expresses that he is normally on a Butrans patch and that she has not seen it since he has been here. I shared that I would look further into this.   _______________________ Addendum:  I met with patients daughter, Aaron Gibbs this evening. She and I excused ourselves. She shares with me the complicated relationship that she shares with her father. She reviews the various places that she has lived and how he has been with her in each location. She expresses how exhaustive it has been caring for  him over the years.  A copy of the dermatology biopsy was obtained and placed in media section of the chart.  We reviewed getting the insights from oncology regarding possible treatment options. If options are poor the idea of hospice care. Patients daughter shares she would prefer for the patient to go to "[facility]" SNF and if prognosis is limited then she would be able to pay out of pocket for care there with hospice coming in.   Questions and concerns addressed/Palliative Support Provided.   Objective Assessment: Vital Signs Vitals:   10/22/22 0536 10/22/22 0932  BP: (!) 178/74 (!) 183/68  Pulse: 60 70  Resp:  18  Temp: 98.5 F (36.9 C) 98.5 F (36.9 C)  SpO2: 95% 96%    Intake/Output Summary (Last 24 hours) at 10/22/2022 1252 Last data filed at 10/21/2022 2107 Gross per 24 hour  Intake 120 ml  Output 300 ml  Net -180 ml    Last Weight  Most recent update: 10/20/2022  6:02 AM    Weight  99 kg (218 lb 4.1 oz)            Gen: Elderly chronically ill-appearing Caucasian male in no acute distress HEENT: Dry mucous membranes CV: Regular rate and rhythm PULM: On room air breathing is even and unlabored ABD: soft/nontender EXT: RLE edema Neuro: Alert and oriented x3  SUMMARY OF RECOMMENDATIONS   DNAR/DNI  Patient's daughter, Aaron Gibbs shared that Meet has malignant melanoma per their dermatologist --> Copy of biopsy result on chart  Aaron Gibbs would  like an oncology consultation to better identify options  Aaron Gibbs shares patient will need short term rehab and long term placement thereafter her preference is [facility]  Chronic Pain: - Seen OP by Dr. Laban Emperor Pain Specialist - Lavera Guise 44mcg/hr TD Q7D (per notes last changed on 5/1) - Gabapentin 300mg  Qday - Oxycodone 5mg  PO Q4H PRN - Dilaudid 0.5-110mf IVP Q2H PRN  Ongoing palliative care support  Billing based on MDM: High   ______________________________________________________________________________________ Aaron Gibbs [place] Palliative Medicine Team Team Cell Phone: [phone] Please utilize secure chat with additional questions, if there is no response within 30 minutes please call the above phone number  Palliative Medicine Team providers are available by phone from 7am to 7pm daily and can be reached through the team cell phone.  Should this patient require assistance outside of these hours, please call the patient's attending physician.

## 2022-10-22 NOTE — Progress Notes (Signed)
Pharmacy Antibiotic Note  Aaron Gibbs is a 88 y.o. male admitted on 10/17/2022 after fall PTA. Pharmacy has been consulted for Vancomycin dosing for bacteremia after BCID showed Staph species in 2 of 4 bottles, no resistance detected.  Was inpatient at [facility] 4/23 >>10/16/22 with COVID-19 infection and COPD exacerbation. Completed 5 days of Doxycycline 4/24>>4/28.  Creatinine up on 4/30, then back to down on 5/1 and stable since. Day # 6 Vancomycin.  Plan: Continue Vancomycin 1500 mg IV q24h. eAUC 504 using SCr 0.83 and Vd 0.5L/kg (BMI 31.2) Follow renal function, clinical progress and antibiotic plans. If Vanc to continue more than a few more days, will check levels this week.  Height: 5\' 10"  (177.8 cm) Weight: 99 kg (218 lb 4.1 oz) IBW/kg (Calculated) : 73  Temp (24hrs), Avg:98.8 F (37.1 C), Min:98.2 F (36.8 C), Max:99.9 F (37.7 C)  Recent Labs  Lab 10/17/22 0141 10/17/22 0510 10/18/22 0431 10/19/22 0422 10/20/22 0224 10/22/22 0035  WBC 11.6*  --  4.3 3.7* 3.5*  --   CREATININE 1.90*  --  1.04 1.08 0.93 0.83  LATICACIDVEN 4.6* 2.7*  --   --   --   --      Estimated Creatinine Clearance: 76.8 mL/min (by C-G formula based on SCr of 0.83 mg/dL).    Allergies  Allergen Reactions   Anthralin Shortness Of Breath   Azithromycin Shortness Of Breath    Antimicrobials this admission: Vancomycin 4/30 >> Cefepime x 1 on 4/30 Metronidazole x 1 on 4/30   Microbiology results: 4/30 COVID+ (also + on 4/24 - no respiratory symptoms) 4/30 Flu and RSV: negative 4/30 blood: Staph epi and hominis 4/30 BCID: Staph species in 2 of 4 bottles, no resistance  Thank you for allowing pharmacy to be a part of this patient's care.  Dennie Fetters, RPh 10/22/2022 4:08 PM

## 2022-10-22 NOTE — Progress Notes (Addendum)
PROGRESS NOTE    Aaron Gibbs  ZOX:[id number] DOB: 1935/04/24 DOA: 10/17/2022 PCP: Smitty Cords, DO    Brief Narrative:   88 year old male with past medical history of blindness, chronic pain on narcotics, COPD, depression, GERD, hypertension who was recently discharged from [facility] yesterday presented to our hospital after sustaining a fall in the middle of the night.  Patient was brought into the hospital by EMS.  He has had 2 falls at home since discharge.  Of note patient was recently admitted to [facility] hospital on 10/11/2022 for COVID-19 infection.  In the ED, patient was mildly febrile, tachycardic.  Initial labs showed creatinine of 1.9.  COVID test was positive.  WBC mildly elevated at 11.6.  CT head was negative for acute abnormality.  CT C-spine showed displaced C2 fracture of the base of    CT scan of the abdomen and pelvis showed partially thrombosed infrarenal abdominal aortic aneurysm of 5 x 4.3 cm.  Neurosurgery was then consulted and patient was admitted hospital for acute kidney injury, C-spine fracture.    Assessment and Plan:  AKI (acute kidney injury)  Initial creatinine of 1.9.  Improved with Ringer lactate.    Creatinine of 0.8 today.  Hypokalemia.  Improved after replacement.  Latest potassium of 3.7   C2 cervical fracture  Neurosurgery was consulted.  CT angiogram of the neck showed cervical fracture.   Radiology recommended a CT angiogram of the neck for assessment of vertebral artery.  At this time, the neurosurgery has recommended no surgical intervention and soft collar and likely need soft collar for 6 weeks.     COVID-19 infection. Stable on room air.  Asymptomatic.  No need for treatment.  Lactic acidosis.  Present on admission.  Could be secondary to infection.  Initially received IV fluids.    Gram-positive bacteremia.  Blood cultures with Staphylococcus species in 3 out of 4 bottles vancomycin has been added to  the regimen.  Cultures showing Staph epidermidis and hominis at this time.  Procalcitonin less than 0.1.  Lactate was elevated at 2.7.  Temperature max of 99.9 F .  On IV vancomycin.   Fall at home, initial encounter Patient with frequent falls.  Palliative care on board for goals of care.  Physical therapy has recommended skilled nursing facility placement at this time.   Chronic heart failure with preserved ejection fraction (HCC) Continue Coreg.  Currently Bumex on hold.  Compensated at this time.   COPD (chronic obstructive pulmonary disease) (HCC) Continue bronchodilators as needed.  Currently stable.  On room air.   Essential hypertension On Coreg twice daily.  Continue to hold Bumex due to AKI.  Slightly elevated blood pressure.  Add needed hydralazine..  Blood pressure likely secondary to pain.   Chronic pain syndrome On buprenorphine patch 10 mcg/h.  Risk of falls due to pain medication.  Will need to be resumed since patient complains of pain.   Hypokalemia Potassium 3.5 today.  Had received IV potassium.  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  Was recently diagnosed at [facility].  Plan is  outpatient vascular surgery follow-up/CT MRI in 6 months.  FOBT positive. Initial hemoglobin of 11.4.  Repeated hemoglobin  at 9.3.  No  obvious blood loss reported to me.  Ferritin at 38, serum iron 36.  Scalp Lesion likely melanoma.   Has had biopsy done at [facility] dermatology, pending biopsy report.  Family wishes  to discuss this with oncology.   DVT prophylaxis: SCDs Start: 10/17/22 0537   Code Status:     Code Status: DNR  Disposition:  Uncertain at this time.  Continue to skilled nursing facility.  Status is: Inpatient  The patient is inpatient because: C2 fracture, deconditioning, debility,  gram-positive bacteremia on IV antibiotic, need for rehabilitation.   Family Communication:    I again spoke with the patient's daughter Ms. [name] on the phone and updated her about the clinical condition of the patient on 10/21/2022.  Spoke with the caretaker at bedside.  Consultants:  Neurosurgery Palliative care  Procedures:  Cervical collar  Antimicrobials:  Vancomycin IV  Anti-infectives (From admission, onward)    Start     Dose/Rate Route Frequency Ordered Stop   10/18/22 0900  vancomycin (VANCOREADY) IVPB 1500 mg/300 mL        1,500 mg 150 mL/hr over 120 Minutes Intravenous Every 24 hours 10/18/22 0813     10/17/22 2144  vancomycin variable dose per unstable renal function (pharmacist dosing)  Status:  Discontinued         Does not apply See admin instructions 10/17/22 2144 10/18/22 0817   10/17/22 0300  vancomycin (VANCOREADY) IVPB 2000 mg/400 mL        2,000 mg 200 mL/hr over 120 Minutes Intravenous  Once 10/17/22 0249 10/17/22 0815   10/17/22 0245  ceFEPIme (MAXIPIME) 2 g in sodium chloride 0.9 % 100 mL IVPB        2 g 200 mL/hr over 30 Minutes Intravenous  Once 10/17/22 0242 10/17/22 0535   10/17/22 0245  metroNIDAZOLE (FLAGYL) IVPB 500 mg        500 mg 100 mL/hr over 60 Minutes Intravenous  Once 10/17/22 0242 10/17/22 0430   10/17/22 0245  vancomycin (VANCOCIN) IVPB 1000 mg/200 mL premix  Status:  Discontinued        1,000 mg 200 mL/hr over 60 Minutes Intravenous  Once 10/17/22 0242 10/17/22 0249      Subjective:  Today, patient was seen and examined at bedside.  Denies any nausea, vomiting fever chills or rigor.  Complains of body pain back pain neck pain.  Objective: Vitals:   10/21/22 1633 10/21/22 2106 10/22/22 0536 10/22/22 0932  BP: (!) 159/63 (!) 162/60 (!) 178/74 (!) 183/68  Pulse: 65 64 60 70  Resp: 17 18  18   Temp: 99.9 F (37.7 C) 98.7 F (37.1 C) 98.5 F (36.9 C) 98.5 F (36.9 C)  TempSrc: Oral Oral Oral Oral  SpO2: 97% 98% 95% 96%  Weight:      Height:        Intake/Output Summary (Last 24 hours) at 10/22/2022 1036 Last data  filed at 10/21/2022 2107 Gross per 24 hour  Intake 120 ml  Output 300 ml  Net -180 ml    Filed Weights   10/17/22 1100 10/19/22 0500 10/20/22 0526  Weight: 91.7 kg 91.7 kg 99 kg    Physical Examination: Body mass index is 31.32 kg/m.   General: Alert awake and Communicative, obese, cervical collar in place,  elderly male, in mild distress due to pain. HENT:   No scleral pallor or icterus noted, Blindness.  Cervical collar in place. Chest:   Diminished breath sounds bilaterally. CVS: S1 &S2 heard. No murmur.  Regular rate and rhythm. Abdomen: Soft, nontender, nondistended.   Extremities: No cyanosis, clubbing or edema.  Peripheral pulses are palpable. Psych: Alert awake and Communicative. CNS: Moving all extremities, alert awake and Communicative, cervical  collar in place.   Skin: Scalp forehead skin cancer.  Data Reviewed:   CBC: Recent Labs  Lab 10/17/22 0141 10/17/22 0232 10/18/22 0431 10/19/22 0422 10/20/22 0224  WBC 11.6*  --  4.3 3.7* 3.5*  NEUTROABS 6.2  --  1.9  --   --   HGB 11.4* 10.2* 8.6* 9.0* 9.3*  HCT 36.4* 30.0* 26.4* 28.1* 29.3*  MCV 94.3  --  92.3 94.6 94.2  PLT 201  --  126* 134* 142*     Basic Metabolic Panel: Recent Labs  Lab 10/17/22 0141 10/17/22 0232 10/18/22 0431 10/19/22 0422 10/20/22 0224 10/22/22 0035  NA 143 144 140 140 141 142  K 3.1* 3.4* 3.1* 3.5 3.7 3.8  CL 108  --  111 111 109 115*  CO2 22  --  22 25 22 23   GLUCOSE 138*  --  103* 104* 119* 114*  BUN 45*  --  25* 22 21 15   CREATININE 1.90*  --  1.04 1.08 0.93 0.83  CALCIUM 9.3  --  8.1* 8.4* 8.7* 8.6*  MG  --   --  1.9 2.1 2.0  --      Liver Function Tests: Recent Labs  Lab 10/17/22 0141 10/18/22 0431  AST 39 24  ALT 40 26  ALKPHOS 54 41  BILITOT 1.4* 1.3*  PROT 6.9 5.1*  ALBUMIN 3.7 2.6*      Radiology Studies: No results found.    LOS: 4 days    Joycelyn Das, MD [facility] Available via Epic secure chat 7am-7pm After these hours, please  refer to coverage provider listed on [email or web address] 10/22/2022, 10:36 AM

## 2022-10-23 ENCOUNTER — Telehealth: Payer: Self-pay | Admitting: Internal Medicine

## 2022-10-23 DIAGNOSIS — Z66 Do not resuscitate: Secondary | ICD-10-CM | POA: Diagnosis not present

## 2022-10-23 DIAGNOSIS — N179 Acute kidney failure, unspecified: Secondary | ICD-10-CM | POA: Diagnosis not present

## 2022-10-23 DIAGNOSIS — S12191A Other nondisplaced fracture of second cervical vertebra, initial encounter for closed fracture: Secondary | ICD-10-CM | POA: Diagnosis not present

## 2022-10-23 DIAGNOSIS — G894 Chronic pain syndrome: Secondary | ICD-10-CM | POA: Diagnosis not present

## 2022-10-23 LAB — BASIC METABOLIC PANEL
Anion gap: 6 (ref 5–15)
BUN: 13 mg/dL (ref 8–23)
CO2: 23 mmol/L (ref 22–32)
Calcium: 8.7 mg/dL — ABNORMAL LOW (ref 8.9–10.3)
Chloride: 112 mmol/L — ABNORMAL HIGH (ref 98–111)
Creatinine, Ser: 0.81 mg/dL (ref 0.61–1.24)
GFR, Estimated: >60 mL/min (ref >60)
Glucose, Bld: 119 mg/dL — ABNORMAL HIGH (ref 70–99)
Potassium: 3.8 mmol/L (ref 3.5–5.1)
Sodium: 141 mmol/L (ref 135–145)

## 2022-10-23 LAB — CBC
HCT: 31.5 % — ABNORMAL LOW (ref 39.0–52.0)
Hemoglobin: 10.1 g/dL — ABNORMAL LOW (ref 13.0–17.0)
MCH: 30.1 pg (ref 26.0–34.0)
MCHC: 32.1 g/dL (ref 30.0–36.0)
MCV: 94 fL (ref 80.0–100.0)
Platelets: 150 10*3/uL (ref 150–400)
RBC: 3.35 MIL/uL — ABNORMAL LOW (ref 4.22–5.81)
RDW: 17.9 % — ABNORMAL HIGH (ref 11.5–15.5)
WBC: 3.9 10*3/uL — ABNORMAL LOW (ref 4.0–10.5)
nRBC: 0.5 % — ABNORMAL HIGH (ref 0.0–0.2)

## 2022-10-23 LAB — MAGNESIUM: Magnesium: 2.2 mg/dL (ref 1.7–2.4)

## 2022-10-23 NOTE — Consult Note (Signed)
[facility]  [phone]   HEMATOLOGY ONCOLOGY INPATIENT CONSULTATION   Aaron Gibbs  DOB: [birth year]  MR#: [id number]  CSN#: [id number]    Requesting Physician: [facility]  Patient Care Team: Smitty Cords, DO as PCP - General (Family Medicine) Marlowe Sax, RN as Case Manager (General Practice)  Reason for consult: skin melanoma  History of present illness:   Aaron Gibbs is a 88 year old gentleman with past medical history of blindness, chronic pain on narcotics, COPD, depression, GERD, hypertension, who was recently at discharged from [facility], and presented to [facility] emergency room after a fall in the middle of the night.  Patient was recently hospitalized in [facility] for fall and COPD exacerbation.  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  Apparently, patient and his daughter Aaron Gibbs was told by dermatologist that his prognosis is very poor, and recommend him to see medical oncologist.  MEDICAL HISTORY:  Past Medical History:  Diagnosis Date   Anxiety    Cancer (HCC)    skin   COPD (chronic obstructive pulmonary disease) (HCC)    Glaucoma    Osteoporosis    osteopenia   Suicide attempt (HCC) (05/13/18) 06/26/2018    SURGICAL HISTORY: Past Surgical History:  Procedure Laterality Date   CHOLECYSTECTOMY     TRANSURETHRAL RESECTION OF PROSTATE N/A 2008    SOCIAL HISTORY: Social History   Socioeconomic History   Marital status: Divorced    Spouse name: Not on file   Number of children: Not on file   Years of education: Not on file   Highest education level: Not on file  Occupational History   Occupation: retired  Tobacco Use   Smoking status: Former     Years: 20    Types: Cigarettes   Smokeless tobacco: Current    Types: Chew   Tobacco comments:    stopped 15 years ago  Advertising account planner   Vaping Use: Never used  Substance and Sexual Activity   Alcohol use: Not Currently    Comment: past   Drug use: Never   Sexual activity: Not Currently  Other Topics Concern   Not on file  Social History Narrative   Not on file   Social Determinants of Health   Financial Resource Strain: Low Risk  (07/13/2022)   Overall Financial Resource Strain (CARDIA)    Difficulty of Paying Living Expenses: Not hard at all  Food Insecurity: No Food Insecurity (10/11/2022)   Hunger Vital Sign    Worried About Running Out of Food in the Last Year: Never true    Ran Out of Food in the Last Year: Never true  Transportation Needs: No Transportation Needs (10/11/2022)   PRAPARE - Administrator, Civil Service (Medical): No    Lack of Transportation (Non-Medical): No  Physical Activity: Inactive (07/13/2022)   Exercise Vital Sign    Days of Exercise per Week: 0 days    Minutes of Exercise per Session: 0 min  Stress: Stress Concern Present (07/13/2022)   [employer] of Occupational Health - Occupational Stress Questionnaire    Feeling of Stress : Rather much  Social Connections: Socially Isolated (07/13/2022)   Social Connection and Isolation Panel [NHANES]  Frequency of Communication with Friends and Family: More than three times a week    Frequency of Social Gatherings with Friends and Family: Twice a week    Attends Religious Services: Never    Database administrator or Organizations: No    Attends Banker Meetings: Never    Marital Status: Divorced  Catering manager Violence: Not At Risk (10/11/2022)   Humiliation, Afraid, Rape, and Kick questionnaire    Fear of Current or Ex-Partner: No    Emotionally Abused: No    Physically Abused: No    Sexually Abused: No    FAMILY HISTORY: Family History  Problem Relation Age of  Onset   Depression Mother    Heart disease Father     ALLERGIES:  is allergic to anthralin and azithromycin.  MEDICATIONS:  Current Facility-Administered Medications  Medication Dose Route Frequency Provider Last Rate Last Admin   acetaminophen (TYLENOL) tablet 650 mg  650 mg Oral Q6H PRN Carollee Herter, DO       Or   acetaminophen (TYLENOL) suppository 650 mg  650 mg Rectal Q6H PRN Carollee Herter, DO       acetaminophen (TYLENOL) tablet 650 mg  650 mg Oral TID Barbara Cower, NP   650 mg at 10/23/22 1644   buprenorphine (BUTRANS) 10 MCG/HR 1 patch  1 patch Transdermal Weekly Carollee Herter, DO   1 patch at 10/18/22 1610   carvedilol (COREG) tablet 12.5 mg  12.5 mg Oral BID Carollee Herter, DO   12.5 mg at 10/23/22 1645   DULoxetine (CYMBALTA) DR capsule 90 mg  90 mg Oral Daily Carollee Herter, DO   90 mg at 10/23/22 9604   feeding supplement (ENSURE ENLIVE / ENSURE PLUS) liquid 237 mL  237 mL Oral TID AC & HS Barbara Cower, NP   237 mL at 10/23/22 1645   finasteride (PROSCAR) tablet 5 mg  5 mg Oral Daily Carollee Herter, DO   5 mg at 10/23/22 5409   gabapentin (NEURONTIN) capsule 300 mg  300 mg Oral Daily Carollee Herter, DO   300 mg at 10/23/22 8119   hydrALAZINE (APRESOLINE) injection 10 mg  10 mg Intravenous Q6H PRN Pokhrel, Laxman, MD       HYDROmorphone (DILAUDID) injection 0.5-1 mg  0.5-1 mg Intravenous Q2H PRN Carollee Herter, DO   1 mg at 10/23/22 1316   iohexol (OMNIPAQUE) 350 MG/ML injection 75 mL  75 mL Intravenous Once PRN Carollee Herter, DO       LORazepam (ATIVAN) tablet 0.5 mg  0.5 mg Oral BID PRN Dow Adolph N, DO   0.5 mg at 10/23/22 0543   OLANZapine (ZYPREXA) tablet 2.5 mg  2.5 mg Oral BID Carollee Herter, DO   2.5 mg at 10/23/22 1246   ondansetron (ZOFRAN) tablet 4 mg  4 mg Oral Q6H PRN Carollee Herter, DO       Or   ondansetron [facility]) injection 4 mg  4 mg Intravenous Q6H PRN Carollee Herter, DO       Oral care mouth rinse  15 mL Mouth Rinse PRN Pokhrel, Laxman, MD       oxyCODONE (Oxy IR/ROXICODONE) immediate release  tablet 5 mg  5 mg Oral Q4H PRN Carollee Herter, DO   5 mg at 10/23/22 1710   tamsulosin (FLOMAX) capsule 0.4 mg  0.4 mg Oral Daily Carollee Herter, DO   0.4 mg at 10/23/22 1478   traZODone (DESYREL) tablet 150 mg  150 mg Oral QHS PRN Carollee Herter,  DO   150 mg at 10/23/22 0254    REVIEW OF SYSTEMS:   Constitutional: Denies fevers, chills or abnormal night sweats, (+) fatigue  Eyes: Denies blurriness of vision, double vision or watery eyes Ears, nose, mouth, throat, and face: Denies mucositis or sore throat Respiratory: Denies cough, dyspnea or wheezes Cardiovascular: Denies palpitation, chest discomfort or lower extremity swelling Gastrointestinal:  Denies nausea, heartburn or change in bowel habits Skin: Denies abnormal skin rashes Lymphatics: Denies new lymphadenopathy or easy bruising Neurological:Denies numbness, tingling or new weaknesses Behavioral/Psych: Mood is stable, no new changes  All other systems were reviewed with the patient and are negative.  PHYSICAL EXAMINATION: ECOG PERFORMANCE STATUS: 3 - Symptomatic, >50% confined to bed  Vitals:   10/23/22 0832 10/23/22 1622  BP: (!) 151/80 (!) 146/66  Pulse: 74 64  Resp: 18 18  Temp: 98.2 F (36.8 C) (!) 97.4 F (36.3 C)  SpO2: 96% 94%   Filed Weights   10/17/22 1100 10/19/22 0500 10/20/22 0526  Weight: 202 lb 2.6 oz (91.7 kg) 202 lb 2.6 oz (91.7 kg) 218 lb 4.1 oz (99 kg)    GENERAL:alert, no distress and comfortable SKIN: there is a 1.5cm black and rased skin lesion in left forearm, I can only see a tiny skin lesion in the mid upper abdomen.  EYES: normal, conjunctiva are pink and non-injected, sclera clear LYMPH:  no palpable lymphadenopathy in the cervical, axillary LUNGS: clear to auscultation and percussion with normal breathing effort HEART: regular rate & rhythm and no murmurs and no lower extremity edema ABDOMEN:abdomen soft, non-tender and normal bowel sounds Musculoskeletal:no cyanosis of digits and no clubbing  PSYCH:  alert & oriented x 3 with fluent speech NEURO: no focal motor/sensory deficits  LABORATORY DATA:  I have reviewed the data as listed Lab Results  Component Value Date   WBC 3.9 (L) 10/23/2022   HGB 10.1 (L) 10/23/2022   HCT 31.5 (L) 10/23/2022   MCV 94.0 10/23/2022   PLT 150 10/23/2022   Recent Labs    10/14/22 8119 10/17/22 0141 10/17/22 0232 10/18/22 0431 10/19/22 0422 10/20/22 0224 10/22/22 0035 10/23/22 0149  NA 144 143   < > 140   < > 141 142 141  K 3.8 3.1*   < > 3.1*   < > 3.7 3.8 3.8  CL 111 108  --  111   < > 109 115* 112*  CO2 24 22  --  22   < > 22 23 23   GLUCOSE 113* 138*  --  103*   < > 119* 114* 119*  BUN 30* 45*  --  25*   < > 21 15 13   CREATININE 1.16 1.90*  --  1.04   < > 0.93 0.83 0.81  CALCIUM 9.1 9.3  --  8.1*   < > 8.7* 8.6* 8.7*  GFRNONAA >60 34*  --  >60   < > >60 >60 >60  PROT 7.0 6.9  --  5.1*  --   --   --   --   ALBUMIN 3.6 3.7  --  2.6*  --   --   --   --   AST 33 39  --  24  --   --   --   --   ALT 31 40  --  26  --   --   --   --   ALKPHOS 51 54  --  41  --   --   --   --  BILITOT 1.0 1.4*  --  1.3*  --   --   --   --    < > = values in this interval not displayed.    RADIOGRAPHIC STUDIES: I have personally reviewed the radiological images as listed and agreed with the findings in the report. CT Lumbar Spine Wo Contrast  Result Date: 10/17/2022 CLINICAL DATA:  88 year old male with recent falls. Spinal ankylosis. EXAM: CT LUMBAR SPINE WITH CONTRAST TECHNIQUE: Technique: Multiplanar CT images of the lumbar spine were reconstructed from contemporary CT of the Abdomen and Pelvis. RADIATION DOSE REDUCTION: This exam was performed according to the departmental dose-optimization program which includes automated exposure control, adjustment of the mA and/or kV according to patient size and/or use of iterative reconstruction technique. CONTRAST:  No additional COMPARISON:  CT Abdomen and Pelvis today reported separately. Thoracic and lumbar spine CT  10/10/2022. FINDINGS: Segmentation: Normal on the comparison. There is a vestigial S1-S2 disc space. Alignment: Stable lumbar lordosis. No significant lumbar scoliosis or spondylolisthesis. Vertebrae: Partially visible diffuse spinal, bilateral SI joint ankylosis and bilateral lower rib costovertebral as noted on the recent comparisons. Associated osteopenia. Maintained lower thoracic and lumbar vertebral height and alignment. No acute osseous abnormality identified. Paraspinal and other soft tissues: Lower chest, abdomen and pelvis viscera are detailed separately. Paraspinal soft tissues remarkable for generalized paraspinal muscle atrophy. Also T12 level left paraspinal hematoma or contusion partially visible on series 3, image 39 and better demonstrated on series 4, image 15 of the CT Abdomen and Pelvis today. This measures about 7 cm long axis, situated in the atrophied paraspinal muscle and subcutaneous layer. No nearby posterior left lower rib fracture is identified. Disc levels: Diffuse spinal ankylosis. No CT evidence of lumbar spinal stenosis. IMPRESSION: 1. Diffuse spinal ankylosis (ankylosing spondylitis) with no acute osseous abnormality in the Lumbar Spine. 2. Left paraspinal hematoma (7 cm) at the T12 vertebral level. 3. CT Abdomen and Pelvis today reported separately. Electronically Signed   By: Odessa Fleming M.D.   On: 10/17/2022 04:46   CT ABDOMEN PELVIS W CONTRAST  Result Date: 10/17/2022 CLINICAL DATA:  Frequent falls. Most recently yesterday. Abdominal and back pain secondary to falls. EXAM: CT ABDOMEN AND PELVIS WITH CONTRAST TECHNIQUE: Multidetector CT imaging of the abdomen and pelvis was performed using the standard protocol following bolus administration of intravenous contrast. RADIATION DOSE REDUCTION: This exam was performed according to the departmental dose-optimization program which includes automated exposure control, adjustment of the mA and/or kV according to patient size and/or use  of iterative reconstruction technique. CONTRAST:  75mL OMNIPAQUE IOHEXOL 350 MG/ML SOLN COMPARISON:  Chest, abdomen and pelvis CT with IV contrast 1 week ago 10/10/2022. FINDINGS: Technical note: Patient could not lie flat and was scanned right lateral decubitus. The patient's arms were also in the field creating streak artifact. Lower chest: There are positional atelectatic changes on the right, linear scarring or atelectasis in the lingula. Lung bases are clear of infiltrate. The cardiac size is normal. Hepatobiliary: Not as well seen as previously due to artifact from breathing motion and the patient's arms in the field. No mass or laceration is seen through the artifacts. The liver is mildly steatotic. Old cholecystectomy again noted with extrahepatic and central intrahepatic biliary prominence, as before, with the common bile duct again measuring 13 mm. No ductal filling defect is seen. Pancreas: Moderately atrophic.  No other visible abnormality. Spleen: No mass or laceration is seen through the motion and streak artifact. Adrenals/Urinary Tract: There is no  adrenal mass. Again noted is a 1.4 cm heterogeneous indeterminate lesion of the dorsal superior pole right kidney, better seen on the prior study, slightly smaller simple cyst more laterally in the upper pole. No other focal renal abnormality is seen. Renal mass protocol MRI is recommended when clinically feasible. There is no urinary stone or obstruction, no bladder thickening. Stomach/Bowel: No dilatation or wall thickening, including the appendix. There is colonic diverticulosis without evidence of diverticulitis. Vascular/Lymphatic: There is a partially thrombosed fusiform infrarenal AAA again measuring 5 x 4.3 cm. There is aortoiliac atherosclerosis. No adenopathy is seen. Reproductive: Prostate is unremarkable. Other: No abdominal wall hernia or focal abnormality. No abdominopelvic ascites. There is moderate abdominal laxity likely obesity related.  Musculoskeletal: Diffuse osteopenia. There are diffuse bridging syndesmophytes of the visualized lower thoracic and lumbar spine. There is bony fusion across the distal spinous processes from the lower thoracic spine down. The SI joints are fused. There is moderate bilateral hip DJD. No acute or other significant osseous findings. IMPRESSION: 1. No acute trauma related findings in the abdomen or pelvis. Image quality degraded by motion and streak artifact in the abdomen. 2. 5 x 4.3 cm partially thrombosed fusiform infrarenal AAA. Recommend follow-up every 6 months and vascular consultation. Reference: J Am Coll Radiol 2013;10:789-794. 3. 1.4 cm heterogeneous indeterminate lesion of the dorsal superior pole right kidney, better seen on the prior study. Renal mass protocol MRI is recommended when clinically feasible. 4. Diverticulosis without evidence of diverticulitis. 5. Aortic atherosclerosis. 6. Diffuse spinal bridging syndesmophytes with bony bridging across the distal spinous processes. Bilaterally fused SI joints. Aortic Atherosclerosis (ICD10-I70.0). Electronically Signed   By: Almira Bar M.D.   On: 10/17/2022 04:18   CT HEAD WO CONTRAST ( )  Result Date: 10/17/2022 CLINICAL DATA:  Fall EXAM: CT HEAD WITHOUT CONTRAST CT CERVICAL SPINE WITHOUT CONTRAST TECHNIQUE: Multidetector CT imaging of the head and cervical spine was performed following the standard protocol without intravenous contrast. Multiplanar CT image reconstructions of the cervical spine were also generated. RADIATION DOSE REDUCTION: This exam was performed according to the departmental dose-optimization program which includes automated exposure control, adjustment of the mA and/or kV according to patient size and/or use of iterative reconstruction technique. COMPARISON:  10/10/2022 FINDINGS: CT HEAD FINDINGS Brain: There is no mass, hemorrhage or extra-axial collection. The size and configuration of the ventricles and extra-axial CSF  spaces are normal. There is hypoattenuation of the periventricular white matter, most commonly indicating chronic ischemic microangiopathy. Vascular: No abnormal hyperdensity of the major intracranial arteries or dural venous sinuses. No intracranial atherosclerosis. Skull: The visualized skull base, calvarium and extracranial soft tissues are normal. Sinuses/Orbits: No fluid levels or advanced mucosal thickening of the visualized paranasal sinuses. No mastoid or middle ear effusion. The orbits are normal. CT CERVICAL SPINE FINDINGS Alignment: No static subluxation. Facets are aligned. Occipital condyles are normally positioned. Skull base and vertebrae: Minimally displaced fracture of C2, at the base of the odontoid. Fracture extends to the right lateral mass. (AO Spine C2 type A). No other acute fracture. There is diffuse ankylosis. Soft tissues and spinal canal: No prevertebral fluid or swelling. No visible canal hematoma. Disc levels: No advanced spinal canal or neural foraminal stenosis. Upper chest: No pneumothorax, pulmonary nodule or pleural effusion. Other: Normal visualized paraspinal cervical soft tissues. IMPRESSION: 1. No acute intracranial abnormality. 2. Minimally displaced fracture of C2, at the base of the odontoid. Fracture extends to the right lateral mass (AO Spine C2 type A). 3. Consider CTA neck  for assessment of the right vertebral artery since the C2 fracture affects the right transverse foramen. These results were called by telephone at the time of interpretation on 10/17/2022 at 3:55 am to provider Roxy Horseman , who verbally acknowledged these results. Electronically Signed   By: Deatra Robinson M.D.   On: 10/17/2022 03:56   CT Cervical Spine Wo Contrast  Result Date: 10/17/2022 CLINICAL DATA:  Fall EXAM: CT HEAD WITHOUT CONTRAST CT CERVICAL SPINE WITHOUT CONTRAST TECHNIQUE: Multidetector CT imaging of the head and cervical spine was performed following the standard protocol without  intravenous contrast. Multiplanar CT image reconstructions of the cervical spine were also generated. RADIATION DOSE REDUCTION: This exam was performed according to the departmental dose-optimization program which includes automated exposure control, adjustment of the mA and/or kV according to patient size and/or use of iterative reconstruction technique. COMPARISON:  10/10/2022 FINDINGS: CT HEAD FINDINGS Brain: There is no mass, hemorrhage or extra-axial collection. The size and configuration of the ventricles and extra-axial CSF spaces are normal. There is hypoattenuation of the periventricular white matter, most commonly indicating chronic ischemic microangiopathy. Vascular: No abnormal hyperdensity of the major intracranial arteries or dural venous sinuses. No intracranial atherosclerosis. Skull: The visualized skull base, calvarium and extracranial soft tissues are normal. Sinuses/Orbits: No fluid levels or advanced mucosal thickening of the visualized paranasal sinuses. No mastoid or middle ear effusion. The orbits are normal. CT CERVICAL SPINE FINDINGS Alignment: No static subluxation. Facets are aligned. Occipital condyles are normally positioned. Skull base and vertebrae: Minimally displaced fracture of C2, at the base of the odontoid. Fracture extends to the right lateral mass. (AO Spine C2 type A). No other acute fracture. There is diffuse ankylosis. Soft tissues and spinal canal: No prevertebral fluid or swelling. No visible canal hematoma. Disc levels: No advanced spinal canal or neural foraminal stenosis. Upper chest: No pneumothorax, pulmonary nodule or pleural effusion. Other: Normal visualized paraspinal cervical soft tissues. IMPRESSION: 1. No acute intracranial abnormality. 2. Minimally displaced fracture of C2, at the base of the odontoid. Fracture extends to the right lateral mass (AO Spine C2 type A). 3. Consider CTA neck for assessment of the right vertebral artery since the C2 fracture  affects the right transverse foramen. These results were called by telephone at the time of interpretation on 10/17/2022 at 3:55 am to provider Roxy Horseman , who verbally acknowledged these results. Electronically Signed   By: Deatra Robinson M.D.   On: 10/17/2022 03:56   DG Pelvis Portable  Result Date: 10/17/2022 CLINICAL DATA:  Recent fall with pelvic pain, initial encounter EXAM: PORTABLE PELVIS 1-2 VIEWS COMPARISON:  10/10/2022 FINDINGS: Degenerative changes of the hip joints are noted bilaterally. Pelvic ring is rotated to the right although no acute fracture is seen. No soft tissue abnormality is noted. IMPRESSION: No acute abnormality noted. Electronically Signed   By: Alcide Clever M.D.   On: 10/17/2022 03:03   DG Chest Port 1 View  Result Date: 10/17/2022 CLINICAL DATA:  Recent fall with chest pain, initial encounter EXAM: PORTABLE CHEST 1 VIEW COMPARISON:  06/16/2022 FINDINGS: Cardiac shadow is within normal limits. Patient is significantly rotated to the right accentuating the mediastinal markings. The lungs are clear. No acute bony abnormality is noted. IMPRESSION: No acute abnormality noted. Electronically Signed   By: Alcide Clever M.D.   On: 10/17/2022 03:02   CT CHEST ABDOMEN PELVIS W CONTRAST  Result Date: 10/10/2022 CLINICAL DATA:  Polytrauma, blunt, fall, left flank pain EXAM: CT CHEST, ABDOMEN, AND PELVIS  WITH CONTRAST TECHNIQUE: Multidetector CT imaging of the chest, abdomen and pelvis was performed following the standard protocol during bolus administration of intravenous contrast. RADIATION DOSE REDUCTION: This exam was performed according to the departmental dose-optimization program which includes automated exposure control, adjustment of the mA and/or kV according to patient size and/or use of iterative reconstruction technique. CONTRAST:  80mL OMNIPAQUE IOHEXOL 350 MG/ML SOLN COMPARISON:  06/16/2022 FINDINGS: CT CHEST FINDINGS Cardiovascular: Mild multi-vessel coronary artery  calcification. Global cardiac size within normal limits. No pericardial effusion. Central pulmonary arteries are of normal caliber. Mild atherosclerotic calcification within the thoracic aorta. No aortic aneurysm. Mediastinum/Nodes: No enlarged mediastinal, hilar, or axillary lymph nodes. Thyroid gland, trachea, and esophagus demonstrate no significant findings. Lungs/Pleura: Right costophrenic angle is partially excluded from view. Bibasilar parenchymal scarring. No pneumothorax or pleural effusion. Bronchial wall thickening in keeping with airway inflammation. No central obstructing lesion. Musculoskeletal: The osseous structures are diffusely osteopenic. Degenerative changes are seen throughout the cervical, thoracic, and lumbar spine with ankylosis of the vertebral column and articular pillars bilaterally. Accentuated thoracic kyphosis. No focal lytic or blastic bone lesion. No acute bone abnormality. CT ABDOMEN PELVIS FINDINGS Hepatobiliary: Status post cholecystectomy. Moderate intra and extrahepatic biliary ductal dilation appears stable since prior examination likely representing post cholecystectomy change. The liver is unremarkable. Pancreas: Unremarkable Spleen: Unremarkable Adrenals/Urinary Tract: The adrenal glands are unremarkable. The kidneys are normal in size and position. In indeterminate 14 mm low-attenuation lesion is seen within the posterior interpolar region of the right kidney which did may demonstrate enhancing internal components but is not optimally characterized on this examination. Adjacent simple cortical cyst noted. The kidneys are otherwise unremarkable. The bladder is unremarkable. Stomach/Bowel: Mild sigmoid diverticulosis. The stomach, small bowel, and large bowel are otherwise unremarkable. Appendix normal. No free intraperitoneal gas or fluid. Vascular/Lymphatic: Fusiform infrarenal abdominal aortic aneurysm demonstrates slight interval increase in size measuring 4.4 x 5.0 cm in  greatest dimension on axial image # 65/7 (previously measuring 4.4 x 4.7 cm). Mild superimposed aortoiliac atherosclerotic calcification. No pathologic adenopathy within the abdomen and pelvis. Reproductive: Prostate is unremarkable. Other: No abdominal wall hernia.  No abdominopelvic ascites. Musculoskeletal: The osseous structures are diffusely osteopenic. Degenerative changes are seen within the lumbar spine and hips bilaterally. The sacroiliac joints and pubic symphysis appear fused. No acute bone abnormality. No lytic or blastic bone lesion. IMPRESSION: 1. No acute intrathoracic or intra-abdominal injury. 2. Mild multi-vessel coronary artery calcification. 3. Mild sigmoid diverticulosis. 4. 14 mm indeterminate lesion within the posterior interpolar region of the right kidney. This may represent a complex cyst, however, this is not optimally characterized on this examination. If clinically indicated, dedicated renal mass protocol CT or MRI examination is recommended for definitive evaluation. 5. Slight interval increase in size of a 5.0 cm infrarenal abdominal aortic aneurysm. Recommend follow-up CT/MR every 6 months and vascular consultation. This recommendation follows ACR consensus guidelines: White Paper of the ACR Incidental Findings Committee II on Vascular Findings. J Am Coll Radiol 2013; 10:789-794. 6. 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 Aortic Atherosclerosis (ICD10-I70.0). Aortic aneurysm NOS (ICD10-I71.9). Electronically Signed   By: Helyn Numbers M.D.   On: 10/10/2022 20:46   CT Head Wo Contrast  Result Date: 10/10/2022 CLINICAL DATA:  Neck trauma EXAM: CT HEAD WITHOUT CONTRAST CT CERVICAL SPINE WITHOUT CONTRAST TECHNIQUE: Multidetector CT imaging of the head and cervical spine was performed following the standard protocol without intravenous contrast. Multiplanar CT image  reconstructions of the cervical spine were  also generated. RADIATION DOSE REDUCTION: This exam was performed according to the departmental dose-optimization program which includes automated exposure control, adjustment of the mA and/or kV according to patient size and/or use of iterative reconstruction technique. COMPARISON:  CT C SPine 12/13/19 FINDINGS: CT HEAD FINDINGS Brain: No evidence of acute infarction, hemorrhage, hydrocephalus, extra-axial collection or mass lesion/mass effect. Sequela of moderate chronic microvascular ischemic change. Vascular: No hyperdense vessel or unexpected calcification. Skull: Normal. Negative for fracture or focal lesion. Sinuses/Orbits: No middle ear or mastoid effusion. Mucosal thickening right maxillary sinus and mild mucosal thickening in the bilateral ethmoid and left maxillary sinus. Bilateral lens replacement. Orbits are otherwise unremarkable. Other: None. CT CERVICAL SPINE FINDINGS Alignment: Normal. Skull base and vertebrae: No acute fracture. There are osseous findings of DISH with multiple bridging anterior osteophytes. There is a lytic lesion the base of the dens extending to the C2 vertebral body (series 8, image 26), unchanged from 2021. There is also an additional lucent lesion in the right occipital bone (series 5, image 6). Soft tissues and spinal canal: No prevertebral fluid or swelling. No visible canal hematoma. Disc levels:  No evidence of high-grade spinal canal stenosis. Upper chest: See separately dictated CT chest abdomen and pelvis for additional findings. Other: None IMPRESSION: 1. No acute intracranial abnormality. 2. No acute cervical spine fracture. Electronically Signed   By: Lorenza Cambridge M.D.   On: 10/10/2022 20:37   CT Cervical Spine Wo Contrast  Result Date: 10/10/2022 CLINICAL DATA:  Neck trauma EXAM: CT HEAD WITHOUT CONTRAST CT CERVICAL SPINE WITHOUT CONTRAST TECHNIQUE: Multidetector CT imaging of the head and cervical spine was performed following the standard protocol without  intravenous contrast. Multiplanar CT image reconstructions of the cervical spine were also generated. RADIATION DOSE REDUCTION: This exam was performed according to the departmental dose-optimization program which includes automated exposure control, adjustment of the mA and/or kV according to patient size and/or use of iterative reconstruction technique. COMPARISON:  CT C SPine 12/13/19 FINDINGS: CT HEAD FINDINGS Brain: No evidence of acute infarction, hemorrhage, hydrocephalus, extra-axial collection or mass lesion/mass effect. Sequela of moderate chronic microvascular ischemic change. Vascular: No hyperdense vessel or unexpected calcification. Skull: Normal. Negative for fracture or focal lesion. Sinuses/Orbits: No middle ear or mastoid effusion. Mucosal thickening right maxillary sinus and mild mucosal thickening in the bilateral ethmoid and left maxillary sinus. Bilateral lens replacement. Orbits are otherwise unremarkable. Other: None. CT CERVICAL SPINE FINDINGS Alignment: Normal. Skull base and vertebrae: No acute fracture. There are osseous findings of DISH with multiple bridging anterior osteophytes. There is a lytic lesion the base of the dens extending to the C2 vertebral body (series 8, image 26), unchanged from 2021. There is also an additional lucent lesion in the right occipital bone (series 5, image 6). Soft tissues and spinal canal: No prevertebral fluid or swelling. No visible canal hematoma. Disc levels:  No evidence of high-grade spinal canal stenosis. Upper chest: See separately dictated CT chest abdomen and pelvis for additional findings. Other: None IMPRESSION: 1. No acute intracranial abnormality. 2. No acute cervical spine fracture. Electronically Signed   By: Lorenza Cambridge M.D.   On: 10/10/2022 20:37   CT T-SPINE NO CHARGE  Result Date: 10/10/2022 CLINICAL DATA:  Trip and fall injury. EXAM: CT Thoracic and Lumbar spine with contrast TECHNIQUE: Multiplanar CT images of the thoracic and  lumbar spine were reconstructed from contemporary CT of the Chest, Abdomen, and Pelvis. RADIATION DOSE REDUCTION: This exam  was performed according to the departmental dose-optimization program which includes automated exposure control, adjustment of the mA and/or kV according to patient size and/or use of iterative reconstruction technique. CONTRAST:  No additional IV contrast material was used. COMPARISON:  CT chest 06/16/2022 FINDINGS: CT THORACIC SPINE FINDINGS Alignment: Thoracic kyphosis without anterior subluxation. Vertebrae: No vertebral compression deformities. No focal bone lesion or bone destruction. Bridging anterior osteophyte or ligamentous calcification consistent with ankylosis. Paraspinal and other soft tissues: Soft tissue swelling and infiltration in the posterior paraspinal muscles in the midthoracic region towards the left likely representing soft tissue hematoma. Disc levels: Intervertebral disc spaces are preserved. CT LUMBAR SPINE FINDINGS Segmentation: 5 lumbar type vertebrae. Alignment: Normal alignment. Vertebrae: No vertebral compression deformities. No focal bone lesion or bone destruction. Bridging calcification anteriorly suggesting ankylosis. Ankylosis of the sacroiliac joints. Paraspinal and other soft tissues: Fatty atrophy of the posterior paraspinal muscles. No abnormal soft tissue swelling or infiltration. Disc levels: Intervertebral disc spaces are preserved. IMPRESSION: 1. Thoracic kyphosis. 2. 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 3. No acute displaced fractures are identified. Electronically Signed   By: Burman Nieves M.D.   On: 10/10/2022 20:35   CT L-SPINE NO CHARGE  Result Date: 10/10/2022 CLINICAL DATA:  Trip and fall injury. EXAM: CT Thoracic and Lumbar spine with contrast TECHNIQUE: Multiplanar CT images of the thoracic and lumbar spine were reconstructed from contemporary CT of the Chest, Abdomen, and Pelvis. RADIATION DOSE REDUCTION: This exam  was performed according to the departmental dose-optimization program which includes automated exposure control, adjustment of the mA and/or kV according to patient size and/or use of iterative reconstruction technique. CONTRAST:  No additional IV contrast material was used. COMPARISON:  CT chest 06/16/2022 FINDINGS: CT THORACIC SPINE FINDINGS Alignment: Thoracic kyphosis without anterior subluxation. Vertebrae: No vertebral compression deformities. No focal bone lesion or bone destruction. Bridging anterior osteophyte or ligamentous calcification consistent with ankylosis. Paraspinal and other soft tissues: Soft tissue swelling and infiltration in the posterior paraspinal muscles in the midthoracic region towards the left likely representing soft tissue hematoma. Disc levels: Intervertebral disc spaces are preserved. CT LUMBAR SPINE FINDINGS Segmentation: 5 lumbar type vertebrae. Alignment: Normal alignment. Vertebrae: No vertebral compression deformities. No focal bone lesion or bone destruction. Bridging calcification anteriorly suggesting ankylosis. Ankylosis of the sacroiliac joints. Paraspinal and other soft tissues: Fatty atrophy of the posterior paraspinal muscles. No abnormal soft tissue swelling or infiltration. Disc levels: Intervertebral disc spaces are preserved. IMPRESSION: 1. Thoracic kyphosis. 2. 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 3. No acute displaced fractures are identified. Electronically Signed   By: Burman Nieves M.D.   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 COPD Hypertension and CHF, stable   Recommendations: -I have reviewed his CT abdomen pelvis, CT head, CT cervical spine and the lumbar spine without contrast, which showed no evidence of malignancy or  metastasis. -I recommend a CT chest without contrast to rule out metastasis, although my suspicion is low. -I have called [facility] and left a message for them to call me back, to further discuss the option of cutaneous resection of his melanomas.  He probably will not be a candidate for lymph node dissection.  He is certainly at risk for metastatic melanoma in the future. -I do not think he is skin  melanoma is the limiting factor for his prognosis.  Due to his advanced age, and multiple medical comorbidities, his overall prognosis is poor.  He has had multiple falls at home, his overall prognosis is likely depends on his other medical issues. -I will call his daughter and addended the note tomorrow after I speak with his dermatologist.  All questions were answered. The patient knows to call the clinic with any problems, questions or concerns.      Malachy Mood, MD 10/23/2022 5:42 PM

## 2022-10-23 NOTE — Telephone Encounter (Signed)
error 

## 2022-10-23 NOTE — Plan of Care (Signed)
  Problem: Clinical Measurements: Goal: Diagnostic test results will improve Outcome: Completed/Met   

## 2022-10-23 NOTE — Care Management Important Message (Signed)
Important Message  Patient Details  Name: Aaron Gibbs MRN: [id number] Date of Birth: 06/24/1934   Medicare Important Message Given:  Yes     Gyneth Hubka Stefan Church 10/23/2022, 3:57 PM

## 2022-10-23 NOTE — Progress Notes (Signed)
PROGRESS NOTE    Aaron Gibbs  ZOX:[id number] DOB: 1935/06/04 DOA: 10/17/2022 PCP: Smitty Cords, DO    Brief Narrative:   88 year old male with past medical history of blindness, chronic pain on narcotics, COPD, depression, GERD, hypertension who was recently discharged from [facility] yesterday presented to our hospital after sustaining a fall in the middle of the night.  Patient was brought into the hospital by EMS.  He has had 2 falls at home since discharge.  Of note patient was recently admitted to [facility] hospital on 10/11/2022 for COVID-19 infection.  In the ED, patient was mildly febrile, tachycardic.  Initial labs showed creatinine of 1.9.  COVID test was positive.  WBC mildly elevated at 11.6.  CT head was negative for acute abnormality.  CT C-spine showed displaced C2 fracture of the base of    CT scan of the abdomen and pelvis showed partially thrombosed infrarenal abdominal aortic aneurysm of 5 x 4.3 cm.  Neurosurgery was then consulted and patient was admitted hospital for acute kidney injury, C-spine fracture.    Assessment and Plan:  AKI (acute kidney injury)  Initial creatinine of 1.9.  Improved with Ringer lactate.    Creatinine of 0.8 today.  Not on IV hydration at this time.  Hypokalemia.  Improved after replacement.  Latest potassium of 3.8   C2 cervical fracture  Neurosurgery was consulted.  CT angiogram of the neck showed cervical fracture.   Radiology recommended a CT angiogram of the neck for assessment of vertebral artery.  At this time, the neurosurgery has recommended no surgical intervention and soft collar and likely need soft collar for 6 weeks.     COVID-19 infection. Stable on room air.  Asymptomatic.  No need for treatment.  Lactic acidosis.  Present on admission.  Could be secondary to infection.  Initially received IV fluids.    Gram-positive bacteremia,  Blood cultures with coagulase-negative Staphylococcus  species in 3 out of 4 bottles.  Likely contaminant cultures showing Staph epidermidis and hominis at this time. Received a 7-day course of vancomycin.   Patient does not have vascular catheter.   Procalcitonin less than 0.1.  Lactate was elevated at 2.7.  Temperature max of 98.3 Fahrenheit today.  Will discontinue vancomycin.   Fall at home, initial encounter Patient with frequent falls.  Palliative care on board for goals of care.  Physical therapy has recommended skilled nursing facility placement at this time.   Chronic heart failure with preserved ejection fraction (HCC) Continue Coreg.  Currently Bumex on hold.  Compensated at this time.   COPD (chronic obstructive pulmonary disease) (HCC) Continue bronchodilators as needed.  Currently stable.  On room air.   Essential hypertension On Coreg twice daily.  Continue to hold Bumex due to AKI.     Chronic pain syndrome On buprenorphine patch 10 mcg/h.  Risk of falls due to pain medication.  Will need to be resumed since patient complains of pain.   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  Was recently diagnosed at [facility].  Plan is outpatient vascular surgery follow-up/CT MRI in 6 months.  FOBT positive. Initial hemoglobin of 11.4. No  obvious blood loss reported to me.  Ferritin at 38, serum iron 36.  Latest hemoglobin of 10.1.  Abdominal and left forearm lesion Has had biopsy done at [facility] dermatology with findings of melanoma.  Discussed this finding with oncology Dr. Mosetta Putt who recommends discussing  this with local oncologist at [facility].  Patient  has seen hem/onc at [facility] Dr. Alena Bills.  Patient's daughter however wishes to discuss this with the oncologist here in the hospital and wants to know about staging prognosis and further workup to assist her in goals of care discussion.     DVT prophylaxis: SCDs Start: 10/17/22 0537   Code  Status:     Code Status: DNR  Disposition:   Plan for skilled nursing facility.  Status is: Inpatient  The patient is inpatient because: C2 fracture, deconditioning, debility,  need for rehabilitation.   Family Communication:   I  spoke with the patient's daughter Ms. [name] on the phone and updated her about the clinical condition of the patient on [id number].   Consultants:  Neurosurgery Palliative care Oncology  Procedures:  Cervical collar  Antimicrobials:  Vancomycin IV-will discontinue.  Anti-infectives (From admission, onward)    Start     Dose/Rate Route Frequency Ordered Stop   10/18/22 0900  vancomycin (VANCOREADY) IVPB 1500 mg/300 mL        1,500 mg 150 mL/hr over 120 Minutes Intravenous Every 24 hours 10/18/22 0813 10/23/22 1247   10/17/22 2144  vancomycin variable dose per unstable renal function (pharmacist dosing)  Status:  Discontinued         Does not apply See admin instructions 10/17/22 2144 10/18/22 0817   10/17/22 0300  vancomycin (VANCOREADY) IVPB 2000 mg/400 mL        2,000 mg 200 mL/hr over 120 Minutes Intravenous  Once 10/17/22 0249 10/17/22 0815   10/17/22 0245  ceFEPIme (MAXIPIME) 2 g in sodium chloride 0.9 % 100 mL IVPB        2 g 200 mL/hr over 30 Minutes Intravenous  Once 10/17/22 0242 10/17/22 0535   10/17/22 0245  metroNIDAZOLE (FLAGYL) IVPB 500 mg        500 mg 100 mL/hr over 60 Minutes Intravenous  Once 10/17/22 0242 10/17/22 0430   10/17/22 0245  vancomycin (VANCOCIN) IVPB 1000 mg/200 mL premix  Status:  Discontinued        1,000 mg 200 mL/hr over 60 Minutes Intravenous  Once 10/17/22 0242 10/17/22 0249      Subjective: Today, patient was seen and examined at bedside.  Complains of neck pain.  Denies any nausea, vomiting fever chills or rigor.   Objective: Vitals:   10/22/22 1601 10/22/22 2137 10/23/22 0458 10/23/22 0832  BP: (!) 165/68 (!) 163/70 (!) 186/85 (!) 151/80  Pulse: 65 68 63 74  Resp: 18 18  18   Temp: 98.2 F (36.8 C)  98.2 F (36.8 C) 98.3 F (36.8 C) 98.2 F (36.8 C)  TempSrc: Oral Oral Oral Oral  SpO2: 94% 95% 94% 96%  Weight:      Height:        Intake/Output Summary (Last 24 hours) at 10/23/2022 1355 Last data filed at 10/23/2022 1328 Gross per 24 hour  Intake 836 ml  Output 1100 ml  Net -264 ml    Filed Weights   10/17/22 1100 10/19/22 0500 10/20/22 0526  Weight: 91.7 kg 91.7 kg 99 kg    Physical Examination: Body mass index is 31.32 kg/m.   General: Obese, alert awake and Communicative, cervical collar in place, elderly male, in mild distress.   HENT:   No scleral pallor or icterus noted, Blindness.  Cervical collar in place. Chest:   Diminished breath sounds bilaterally.  No wheezes or crackles. CVS: S1 &S2 heard. No murmur.  Regular rate and  rhythm. Abdomen: Soft, nontender, nondistended.   Extremities: No cyanosis, clubbing or edema.   Psych: Alert awake and Communicative. CNS: Alert awake and Communicative, cervical collar in place.  Moves all extremities.   Skin: forehead ulceration, left forearm with induration, right abdominal wall with ulceration.  Data Reviewed:   CBC: Recent Labs  Lab 10/17/22 0141 10/17/22 0232 10/18/22 0431 10/19/22 0422 10/20/22 0224 10/23/22 0149  WBC 11.6*  --  4.3 3.7* 3.5* 3.9*  NEUTROABS 6.2  --  1.9  --   --   --   HGB 11.4* 10.2* 8.6* 9.0* 9.3* 10.1*  HCT 36.4* 30.0* 26.4* 28.1* 29.3* 31.5*  MCV 94.3  --  92.3 94.6 94.2 94.0  PLT 201  --  126* 134* 142* 150     Basic Metabolic Panel: Recent Labs  Lab 10/18/22 0431 10/19/22 0422 10/20/22 0224 10/22/22 0035 10/23/22 0149  NA 140 140 141 142 141  K 3.1* 3.5 3.7 3.8 3.8  CL 111 111 109 115* 112*  CO2 22 25 22 23 23   GLUCOSE 103* 104* 119* 114* 119*  BUN 25* 22 21 15 13   CREATININE 1.04 1.08 0.93 0.83 0.81  CALCIUM 8.1* 8.4* 8.7* 8.6* 8.7*  MG 1.9 2.1 2.0  --  2.2     Liver Function Tests: Recent Labs  Lab 10/17/22 0141 10/18/22 0431  AST 39 24  ALT 40 26  ALKPHOS  54 41  BILITOT 1.4* 1.3*  PROT 6.9 5.1*  ALBUMIN 3.7 2.6*     Radiology Studies: No results found.    LOS: 5 days    Joycelyn Das, MD [facility] Available via Epic secure chat 7am-7pm After these hours, please refer to coverage provider listed on [email or web address] 10/23/2022, 1:55 PM

## 2022-10-23 NOTE — Progress Notes (Signed)
Palliative Medicine Inpatient Follow Up Note HPI:  88 year old male with past medical history of blindness, chronic pain on narcotics, COPD, depression, GERD, hypertension who was recently discharged from [facility] yesterday presented to our hospital after sustaining a fall in the middle of the night.  Patient was brought into the hospital by EMS.  He has had 2 falls at home since discharge.   Palliative care has been asked to get involved to help establish goals of care in the setting of chronic comorbid conditions and overall declining health.  Today's Discussion 10/23/2022  *Please note that this is a verbal dictation therefore any spelling or grammatical errors are due to the "Dragon Medical One" system interpretation.  Chart reviewed inclusive of vital signs, progress notes, laboratory results, and diagnostic images.   I met with Aaron Gibbs at bedside this morning. He shares that he remains to have ongoing pain in his neck and back. I shared that I would request additional pain medications from his RN, Hospital doctor.  I spoke to patients RN who shares that she has no significant concerns this morning.   I spoke to patients daughter who expresses the need for oncology consultation as Aaron Gibbs is not at a place whereby he can transition to and from appointments given the degree of debility he has. She shares that he can't transition to rehabilitation until there is greater insights on his melanoma as she does not know which direction to go in terms of care. I shared that I would strongly advocate for this.   Questions and concerns addressed/Palliative Support Provided.   Objective Assessment: Vital Signs Vitals:   10/23/22 0458 10/23/22 0832  BP: (!) 186/85 (!) 151/80  Pulse: 63 74  Resp:  18  Temp: 98.3 F (36.8 C) 98.2 F (36.8 C)  SpO2: 94% 96%    Intake/Output Summary (Last 24 hours) at 10/23/2022 1544 Last data filed at 10/23/2022 1328 Gross per 24 hour  Intake 596 ml   Output 1100 ml  Net -504 ml    Last Weight  Most recent update: 10/20/2022  6:02 AM    Weight  99 kg (218 lb 4.1 oz)            Gen: Elderly chronically ill-appearing Caucasian male in no acute distress HEENT: Dry mucous membranes CV: Regular rate and rhythm PULM: On room air breathing is even and unlabored ABD: soft/nontender EXT: RLE edema Neuro: Alert and oriented x3  SUMMARY OF RECOMMENDATIONS   DNAR/DNI  Patient's daughter, Aaron Gibbs shared that [name] has malignant melanoma per their dermatologist --> Copy of biopsy result on chart  Kia would like an oncology consultation to better identify options  Aaron Gibbs shares patient will need short term rehab and long term placement thereafter her preference is [facility]  Chronic Pain: - Seen OP by Dr. Laban Emperor Pain Specialist - Lavera Guise 79mcg/hr TD Q7D (per notes last changed on 5/1) - Gabapentin 300mg  Qday - Oxycodone 5mg  PO Q4H PRN - Dilaudid 0.5-37mf IVP Q2H PRN  Ongoing palliative care support  Billing based on MDM: Moderate ______________________________________________________________________________________ Aaron Gibbs [place] Palliative Medicine Team Team Cell Phone: [phone] Please utilize secure chat with additional questions, if there is no response within 30 minutes please call the above phone number  Palliative Medicine Team providers are available by phone from 7am to 7pm daily and can be reached through the team cell phone.  Should this patient require assistance outside of these hours, please call the patient's attending physician.

## 2022-10-24 ENCOUNTER — Encounter (HOSPITAL_COMMUNITY): Payer: Self-pay | Admitting: Internal Medicine

## 2022-10-24 DIAGNOSIS — U071 COVID-19: Secondary | ICD-10-CM | POA: Diagnosis not present

## 2022-10-24 DIAGNOSIS — W19XXXA Unspecified fall, initial encounter: Secondary | ICD-10-CM | POA: Diagnosis not present

## 2022-10-24 DIAGNOSIS — S12191A Other nondisplaced fracture of second cervical vertebra, initial encounter for closed fracture: Secondary | ICD-10-CM | POA: Diagnosis not present

## 2022-10-24 DIAGNOSIS — N179 Acute kidney failure, unspecified: Secondary | ICD-10-CM | POA: Diagnosis not present

## 2022-10-24 NOTE — Progress Notes (Signed)
Aaron Gibbs   DOB:1934/12/14   YQ#:[id number]   XBM#:[id number]  Oncology follow up   Subjective: Pt is clinically stable, no new complains.    Objective:  Vitals:   10/24/22 0424 10/24/22 0842  BP: (!) 181/101 (!) 169/144  Pulse: 92 (!) 105  Resp: 18 19  Temp: 98.3 F (36.8 C) 98.6 F (37 C)  SpO2: 96% 98%    Body mass index is 30.65 kg/m.  Intake/Output Summary (Last 24 hours) at 10/24/2022 1533 Last data filed at 10/24/2022 0272 Gross per 24 hour  Intake 1212 ml  Output 500 ml  Net 712 ml     Sclerae unicteric  (+) there is a 3 cm skin lesion in the left forearm with darkness in the middle, previously biopsied.  He has multiple bruises on his arms, and a large area of ecchymosis in the low back.  No peripheral adenopathy  Lungs clear -- no rales or rhonchi  Heart regular rate and rhythm  Abdomen benign  MSK no focal spinal tenderness, no peripheral edema  Neuro nonfocal    CBG (last 3)  No results for input(s): "GLUCAP" in the last 72 hours.   Labs:  Urine Studies No results for input(s): "UHGB", "CRYS" in the last 72 hours.  Invalid input(s): "UACOL", "UAPR", "USPG", "UPH", "UTP", "UGL", "UKET", "UBIL", "UNIT", "UROB", "ULEU", "UEPI", "UWBC", "URBC", "UBAC", "CAST", "UCOM", "BILUA"  Basic Metabolic Panel: Recent Labs  Lab 10/18/22 0431 10/19/22 0422 10/20/22 0224 10/22/22 0035 10/23/22 0149  NA 140 140 141 142 141  K 3.1* 3.5 3.7 3.8 3.8  CL 111 111 109 115* 112*  CO2 22 25 22 23 23   GLUCOSE 103* 104* 119* 114* 119*  BUN 25* 22 21 15 13   CREATININE 1.04 1.08 0.93 0.83 0.81  CALCIUM 8.1* 8.4* 8.7* 8.6* 8.7*  MG 1.9 2.1 2.0  --  2.2   GFR Estimated Creatinine Clearance: 77.9 mL/min (by C-G formula based on SCr of 0.81 mg/dL). Liver Function Tests: Recent Labs  Lab 10/18/22 0431  AST 24  ALT 26  ALKPHOS 41  BILITOT 1.3*  PROT 5.1*  ALBUMIN 2.6*   No results for input(s): "LIPASE", "AMYLASE" in the last 168 hours. No results for input(s):  "AMMONIA" in the last 168 hours. Coagulation profile No results for input(s): "INR", "PROTIME" in the last 168 hours.  CBC: Recent Labs  Lab 10/18/22 0431 10/19/22 0422 10/20/22 0224 10/23/22 0149  WBC 4.3 3.7* 3.5* 3.9*  NEUTROABS 1.9  --   --   --   HGB 8.6* 9.0* 9.3* 10.1*  HCT 26.4* 28.1* 29.3* 31.5*  MCV 92.3 94.6 94.2 94.0  PLT 126* 134* 142* 150   Cardiac Enzymes: No results for input(s): "CKTOTAL", "CKMB", "CKMBINDEX", "TROPONINI" in the last 168 hours. BNP: Invalid input(s): "POCBNP" CBG: No results for input(s): "GLUCAP" in the last 168 hours. D-Dimer No results for input(s): "DDIMER" in the last 72 hours. Hgb A1c No results for input(s): "HGBA1C" in the last 72 hours. Lipid Profile No results for input(s): "CHOL", "HDL", "LDLCALC", "TRIG", "CHOLHDL", "LDLDIRECT" in the last 72 hours. Thyroid function studies No results for input(s): "TSH", "T4TOTAL", "T3FREE", "THYROIDAB" in the last 72 hours.  Invalid input(s): "FREET3" Anemia work up No results for input(s): "VITAMINB12", "FOLATE", "FERRITIN", "TIBC", "IRON", "RETICCTPCT" in the last 72 hours. Microbiology Recent Results (from the past 240 hour(s))  Resp panel by RT-PCR (RSV, Flu A&B, Covid) Anterior Nasal Swab     Status: Abnormal   Collection Time: 10/17/22  2:27 AM   Specimen: Anterior Nasal Swab  Result Value Ref Range Status   SARS Coronavirus 2 by RT PCR POSITIVE (A) NEGATIVE Final   Influenza A by PCR NEGATIVE NEGATIVE Final   Influenza B by PCR NEGATIVE NEGATIVE Final    Comment: (NOTE) The Xpert Xpress SARS-CoV-2/FLU/RSV plus assay is intended as an aid in the diagnosis of influenza from Nasopharyngeal swab specimens and should not be used as a sole basis for treatment. Nasal washings and aspirates are unacceptable for Xpert Xpress SARS-CoV-2/FLU/RSV testing.  Fact Sheet for Patients: BloggerCourse.com  Fact Sheet for Healthcare  Providers: SeriousBroker.it  This test is not yet approved or cleared by the [place] FDA and has been authorized for detection and/or diagnosis of SARS-CoV-2 by FDA under an Emergency Use Authorization (EUA). This EUA will remain in effect (meaning this test can be used) for the duration of the COVID-19 declaration under Section 564(b)(1) of the Act, 21 U.S.C. section 360bbb-3(b)(1), unless the authorization is terminated or revoked.     Resp Syncytial Virus by PCR NEGATIVE NEGATIVE Final    Comment: (NOTE) Fact Sheet for Patients: BloggerCourse.com  Fact Sheet for Healthcare Providers: SeriousBroker.it  This test is not yet approved or cleared by the [place] FDA and has been authorized for detection and/or diagnosis of SARS-CoV-2 by FDA under an Emergency Use Authorization (EUA). This EUA will remain in effect (meaning this test can be used) for the duration of the COVID-19 declaration under Section 564(b)(1) of the Act, 21 U.S.C. section 360bbb-3(b)(1), unless the authorization is terminated or revoked.  Performed at [facility], [address] [place], [place] [address]   Blood Culture (routine x 2)     Status: Abnormal   Collection Time: 10/17/22  2:33 AM   Specimen: BLOOD RIGHT ARM  Result Value Ref Range Status   Specimen Description BLOOD RIGHT ARM  Final   Special Requests   Final    BOTTLES DRAWN AEROBIC AND ANAEROBIC Blood Culture adequate volume   Culture  Setup Time   Final    GRAM POSITIVE COCCI IN CLUSTERS IN BOTH AEROBIC AND ANAEROBIC BOTTLES CRITICAL RESULT CALLED TO, READ BACK BY AND VERIFIED WITH: PHARMD T. DANG [id number] @2046  FH    Culture (A)  Final    STAPHYLOCOCCUS HOMINIS THE SIGNIFICANCE OF ISOLATING THIS ORGANISM FROM A SINGLE SET OF BLOOD CULTURES WHEN MULTIPLE SETS ARE DRAWN IS UNCERTAIN. PLEASE NOTIFY THE MICROBIOLOGY DEPARTMENT WITHIN ONE WEEK IF  SPECIATION AND SENSITIVITIES ARE REQUIRED. Performed at [facility], [address], [place], [place] [address]    Report Status 10/19/2022 FINAL  Final  Blood Culture (routine x 2)     Status: Abnormal   Collection Time: 10/17/22  2:33 AM   Specimen: BLOOD LEFT ARM  Result Value Ref Range Status   Specimen Description BLOOD LEFT ARM  Final   Special Requests   Final    BOTTLES DRAWN AEROBIC AND ANAEROBIC Blood Culture adequate volume   Culture  Setup Time   Final    GRAM POSITIVE COCCI ANAEROBIC BOTTLE ONLY CRITICAL VALUE NOTED.  VALUE IS CONSISTENT WITH PREVIOUSLY REPORTED AND CALLED VALUE.    Culture (A)  Final    STAPHYLOCOCCUS EPIDERMIDIS THE SIGNIFICANCE OF ISOLATING THIS ORGANISM FROM A SINGLE SET OF BLOOD CULTURES WHEN MULTIPLE SETS ARE DRAWN IS UNCERTAIN. PLEASE NOTIFY THE MICROBIOLOGY DEPARTMENT WITHIN ONE WEEK IF SPECIATION AND SENSITIVITIES ARE REQUIRED. Performed at [facility], [address],  [place], [place] [address]    Report Status 10/19/2022 FINAL  Final  Blood Culture ID Panel (Reflexed)     Status: Abnormal   Collection Time: 10/17/22  2:33 AM  Result Value Ref Range Status   Enterococcus faecalis NOT DETECTED NOT DETECTED Final   Enterococcus Faecium NOT DETECTED NOT DETECTED Final   Listeria monocytogenes NOT DETECTED NOT DETECTED Final   Staphylococcus species DETECTED (A) NOT DETECTED Final    Comment: CRITICAL RESULT CALLED TO, READ BACK BY AND VERIFIED WITH: PHARMD T. DANG [id number] @2046  FH    Staphylococcus aureus (BCID) NOT DETECTED NOT DETECTED Final   Staphylococcus epidermidis NOT DETECTED NOT DETECTED Final   Staphylococcus lugdunensis NOT DETECTED NOT DETECTED Final   Streptococcus species NOT DETECTED NOT DETECTED Final   Streptococcus agalactiae NOT DETECTED NOT DETECTED Final   Streptococcus pneumoniae NOT DETECTED NOT DETECTED Final   Streptococcus pyogenes NOT DETECTED NOT DETECTED Final   A.calcoaceticus-baumannii NOT  DETECTED NOT DETECTED Final   Bacteroides fragilis NOT DETECTED NOT DETECTED Final   Enterobacterales NOT DETECTED NOT DETECTED Final   Enterobacter cloacae complex NOT DETECTED NOT DETECTED Final   Escherichia coli NOT DETECTED NOT DETECTED Final   Klebsiella aerogenes NOT DETECTED NOT DETECTED Final   Klebsiella oxytoca NOT DETECTED NOT DETECTED Final   Klebsiella pneumoniae NOT DETECTED NOT DETECTED Final   Proteus species NOT DETECTED NOT DETECTED Final   Salmonella species NOT DETECTED NOT DETECTED Final   Serratia marcescens NOT DETECTED NOT DETECTED Final   Haemophilus influenzae NOT DETECTED NOT DETECTED Final   Neisseria meningitidis NOT DETECTED NOT DETECTED Final   Pseudomonas aeruginosa NOT DETECTED NOT DETECTED Final   Stenotrophomonas maltophilia NOT DETECTED NOT DETECTED Final   Candida albicans NOT DETECTED NOT DETECTED Final   Candida auris NOT DETECTED NOT DETECTED Final   Candida glabrata NOT DETECTED NOT DETECTED Final   Candida krusei NOT DETECTED NOT DETECTED Final   Candida parapsilosis NOT DETECTED NOT DETECTED Final   Candida tropicalis NOT DETECTED NOT DETECTED Final   Cryptococcus neoformans/gattii NOT DETECTED NOT DETECTED Final    Comment: Performed at [facility], [address] [place], [place] [address]      Studies:  No results found.  Assessment: 88 y.o. male   Melanoma in situ in right upper abdomen, and in-transit melanoma in left forearm with unknown primary C2 cervical fracture AKI, resolved Multiple falls at home COVID-19 infection, asymptomatic. COPD Hypertension and CHF, stable  Plan:  -I spoke with his dermatologist this morning, the biopsy from the skin lesion in the left forearm showed metastatic melanoma, the primary site is unknown at this point.  He recently had a CT scan of the chest, abdomen pelvis a few weeks ago which showed no signs of other metastatic disease or adenopathy.  Ideally a PET scan would be very helpful  for the primary and rule out other occluded metastatic lesion, unfortunately we are not able to get PET scan done in the hospital, and patient is not able to lay flat, not sure if he is able to tolerate a PET scan.  -Given the size of the lesion (about 3 cm) in the left forearm, dermatologist recommended consulting general surgeon to see if they can remove it.  Given his age and medical conditions, we do not recommend axillary lymph node biopsy or any adjuvant therapy. -I discussed the above with patient and his daughter [name] in detail.  They would like to proceed with surgery to remove  the left forearm lesion, and continue monitoring afterwards.  They understand there is a very high chance his melanoma may return, or even became metastatic to other internal organs, but we do not anticipate he will die from melanoma in the near future. -I have called general surgery, to see if they can see the patient and get the surgery done while he is still in hospital. -I will follow-up as needed.   Malachy Mood, MD 10/24/2022  3:33 PM

## 2022-10-24 NOTE — Progress Notes (Signed)
Palliative Medicine Inpatient Follow Up Note HPI:  88 year old male with past medical history of blindness, chronic pain on narcotics, COPD, depression, GERD, hypertension who was recently discharged from [facility] yesterday presented to our hospital after sustaining a fall in the middle of the night.  Patient was brought into the hospital by EMS.  He has had 2 falls at home since discharge.   Palliative care has been asked to get involved to help establish goals of care in the setting of chronic comorbid conditions and overall declining health.  Today's Discussion 10/24/2022  *Please note that this is a verbal dictation therefore any spelling or grammatical errors are due to the "Dragon Medical One" system interpretation.  Chart reviewed inclusive of vital signs, progress notes, laboratory results, and diagnostic images. Aaron Gibbs is drinking supplemental shakes though eating poorly otherwise.   I spoke to patients RN this morning who shares no significant concerns.   When I met with Aaron Gibbs he was pleasant but share he overall continues to feel pain, he does think that his present pain regiment is helping him.   I called patients daughter, Aaron Gibbs. We discussed [place] present melanoma in situ. She is flabbergasted as it was explained to her by the dermatologist that it was likely metastatic. We discussed that as of presently there is not imaging data to support this. We did review though the idea of hospice as Aaron Gibbs does have multiple chronic co-morbidities and overall has experienced worsening functional state. I shared that he is now appearing to have multi-system failure. We reviewed that he may be a candidate for hospice based upon this. We did discuss that hospice as SNF would be an out of pocket expense for room and board which Aaron Gibbs shares his estate can likely accommodate for roughly one year or so.   We discussed completing a MOST form to better help decisions related to  goals moving forward. Aaron Gibbs shares that she has to step into a meeting shortly. She wants to think about if she would want Aaron Gibbs to get mIVF or antibiotics if illness occurs. She is clear that artificial nutrition it not aligned with Aaron Gibbs wishes.  We reviewed that I would not be present tomorrow though a team member can call for additional conversations as well as ongoing dialogue with Aaron Gibbs.   Questions and concerns addressed/Palliative Support Provided.   Objective Assessment: Vital Signs Vitals:   10/24/22 0424 10/24/22 0842  BP: (!) 181/101 (!) 169/144  Pulse: 92 (!) 105  Resp: 18 19  Temp: 98.3 F (36.8 C) 98.6 F (37 C)  SpO2: 96% 98%    Intake/Output Summary (Last 24 hours) at 10/24/2022 1046 Last data filed at 10/24/2022 1610 Gross per 24 hour  Intake 1228 ml  Output 850 ml  Net 378 ml    Last Weight  Most recent update: 10/24/2022  8:12 AM    Weight  96.9 kg (213 lb 10 oz)            Gen: Elderly chronically ill-appearing Caucasian male in no acute distress HEENT: Dry mucous membranes CV: Regular rate and rhythm PULM: On room air breathing is even and unlabored ABD: soft/nontender EXT: RLE edema Neuro: Alert and oriented x2  SUMMARY OF RECOMMENDATIONS   DNAR/DNI  Plan to complete a MOST form tomorrow  Described the differences between OP Palliative as Hospice support  Aaron Gibbs shares patient will need short term rehab and long term placement thereafter her preference is [facility]  Chronic Pain: -  Seen OP by Dr. Laban Gibbs Pain Specialist - Aaron Gibbs 7mcg/hr TD Q7D (per notes last changed on 5/1) - Gabapentin 300mg  Qday - Oxycodone 5mg  PO Q4H PRN - Dilaudid 0.5-73mf IVP Q2H PRN  Ongoing palliative care support  Billing based on MDM: High ______________________________________________________________________________________ Aaron Gibbs [place] Palliative Medicine Team Team Cell Phone: [phone] Please utilize secure chat with  additional questions, if there is no response within 30 minutes please call the above phone number  Palliative Medicine Team providers are available by phone from 7am to 7pm daily and can be reached through the team cell phone.  Should this patient require assistance outside of these hours, please call the patient's attending physician.

## 2022-10-24 NOTE — Consult Note (Signed)
Consult Note  Aaron Gibbs 11/15/1934  [id number].    Requesting MD: Dr. Mosetta Putt Chief Complaint/Reason for Consult: melanoma lesion  HPI:  88 y.o. male with medical history significant for hypertension, depression, chronic pain, COPD, hearing impairment, glucoma, legal blindness, anxiety, osteoporosis, chronic thrombocytopenia, depression, GERD  who presented to [facility] ED [facility]/30 via EMS after a fall at home. He had just been discharged home from [facility] hospital the day prior after admission starting 4/24 for a fall and COVID 19 infection. Work up significant for C2 fracture and AKI and patient admitted to the hospitalist service. Neurosurgery consulted and c spine fracture being managed nonoperatively. He has a known metastatic melanoma lesion of his left forearm previously evaluated and biopsied by dermatology. Primary lesion unknown. Oncology consulted and further work up without signs of metastasis at this time  General surgery asked to see for melanoma excision. He is not sure when he first developed lesion. He complains of neck and musculoskeletal pain from falls. He has a cough and some shortness of breath with activity  Former cigarette smoker, he denies anticoagulant use.   ROS: ROS as above  Family History  Problem Relation Age of Onset   Depression Mother    Heart disease Father     Past Medical History:  Diagnosis Date   Anxiety    Cancer (HCC)    skin   COPD (chronic obstructive pulmonary disease) (HCC)    Glaucoma    Osteoporosis    osteopenia   Suicide attempt (HCC) (05/13/18) 06/26/2018    Past Surgical History:  Procedure Laterality Date   CHOLECYSTECTOMY     TRANSURETHRAL RESECTION OF PROSTATE N/A 2008    Social History:  reports that he has quit smoking. His smoking use included cigarettes. His smokeless tobacco use includes chew. He reports that he does not currently use alcohol. He reports that he does not use drugs.  Allergies:  Allergies   Allergen Reactions   Anthralin Shortness Of Breath   Azithromycin Shortness Of Breath    Medications Prior to Admission  Medication Sig Dispense Refill   acetaminophen (TYLENOL) 500 MG tablet Take 1,000 mg by mouth in the morning, at noon, and at bedtime.     bumetanide (BUMEX) 1 MG tablet Take 1/2 (one-half) tablet by mouth twice daily (Patient taking differently: Take 0.5 mg by mouth 2 (two) times daily.) 90 tablet 0   [START ON 11/29/2022] buprenorphine (BUTRANS) 10 MCG/HR PTWK Place 1 patch onto the skin once a week for 28 days. Apply only 1 patch at a time and alternate sites weekly. 4 patch 0   carvedilol (COREG) 12.5 MG tablet Take 1 tablet by mouth twice daily 180 tablet 0   COSOPT 22.3-6.8 MG/ML ophthalmic solution Place 1 drop into both eyes 2 (two) times daily.     diclofenac Sodium (VOLTAREN) 1 % GEL Apply 2 g topically at bedtime. Apply to both knees twice daily (Patient taking differently: Apply 2 g topically at bedtime. Apply to both knees at night) 350 g 0   DULoxetine (CYMBALTA) 30 MG capsule Take 3 capsules (90 mg total) by mouth daily. 270 capsule 1   finasteride (PROSCAR) 5 MG tablet Take 1 tablet (5 mg total) by mouth daily. 90 tablet 3   gabapentin (NEURONTIN) 300 MG capsule Take 1 capsule (300 mg total) by mouth daily. (Patient taking differently: Take 300 mg by mouth at bedtime.) 90 capsule 3   Iron, Ferrous Sulfate, 325 (65 Fe) MG TABS  Take 325 mg by mouth daily with supper. 30 tablet    latanoprost (XALATAN) 0.005 % ophthalmic solution Place 1 drop into both eyes at bedtime.     LORazepam (ATIVAN) 0.5 MG tablet Take 1 tablet (0.5 mg total) by mouth in the morning, at noon, and at bedtime. 90 tablet 2   Melatonin 5 MG CAPS Take 1 capsule (5 mg total) by mouth at bedtime. (Patient taking differently: Take 10 mg by mouth at bedtime.)  0   meloxicam (MOBIC) 15 MG tablet Take 15 mg by mouth daily.     OLANZapine (ZYPREXA) 2.5 MG tablet Take 1 tablet by mouth twice daily 180  tablet 0   pantoprazole (PROTONIX) 40 MG tablet Take 1 tablet (40 mg total) by mouth daily. 90 tablet 3   tamsulosin (FLOMAX) 0.4 MG CAPS capsule Take 1 capsule by mouth once daily 90 capsule 1   traZODone (DESYREL) 150 MG tablet TAKE 1 TABLET BY MOUTH AT BEDTIME AS NEEDED (Patient taking differently: Take 150 mg by mouth at bedtime.) 90 tablet 0   buprenorphine (BUTRANS) 10 MCG/HR PTWK Place 1 patch onto the skin once a week for 28 days. Apply only 1 patch at a time and alternate sites weekly. 4 patch 0    Blood pressure (!) 169/144, pulse (!) 105, temperature 98.6 F (37 C), temperature source Axillary, resp. rate 19, height 5\' 10"  (1.778 m), weight 96.9 kg, SpO2 98 %. Physical Exam: General: pleasant, WD, male who is laying in bed in NAD HEENT: soft c collar in place Lungs: Respiratory effort nonlabored on room air MSK: all 4 extremities are symmetrical with no cyanosis, clubbing, or edema. Skin: warm and dry. Scattered ecchymosis on extremities and back. Melanoma lesion L volar forearm approx 3-2 cm with raised darkened central aspect without fluctuance and with underlying skin thickening Psych: Alert and oriented to self, year and somewhat to situation. Disoriented to place - thinks he is in [place]   Results for orders placed or performed during the hospital encounter of 10/17/22 (from the past 48 hour(s))  Basic metabolic panel     Status: Abnormal   Collection Time: 10/23/22  1:49 AM  Result Value Ref Range   Sodium 141 135 - 145 mmol/L   Potassium 3.8 3.5 - 5.1 mmol/L   Chloride 112 (H) 98 - 111 mmol/L   CO2 23 22 - 32 mmol/L   Glucose, Bld 119 (H) 70 - 99 mg/dL    Comment: Glucose reference range applies only to samples taken after fasting for at least 8 hours.   BUN 13 8 - 23 mg/dL   Creatinine, Ser 1.61 0.61 - 1.24 mg/dL   Calcium 8.7 (L) 8.9 - 10.3 mg/dL   GFR, Estimated >09 >60 mL/min    Comment: (NOTE) Calculated using the CKD-EPI Creatinine Equation (2021)     Anion gap 6 5 - 15    Comment: Performed at [facility], [address], [place], [place] [address]  CBC     Status: Abnormal   Collection Time: 10/23/22  1:49 AM  Result Value Ref Range   WBC 3.9 (L) 4.0 - 10.5 K/uL   RBC 3.35 (L) 4.22 - 5.81 MIL/uL   Hemoglobin 10.1 (L) 13.0 - 17.0 g/dL   HCT 81.1 (L) 91.4 - 78.2 %   MCV 94.0 80.0 - 100.0 fL   MCH 30.1 26.0 - 34.0 pg   MCHC 32.1 30.0 - 36.0 g/dL   RDW 95.6 (H) 21.3 -  15.5 %   Platelets 150 150 - 400 K/uL   nRBC 0.5 (H) 0.0 - 0.2 %    Comment: Performed at [facility], [address], [place], [place] [address]  Magnesium     Status: None   Collection Time: 10/23/22  1:49 AM  Result Value Ref Range   Magnesium 2.2 1.7 - 2.4 mg/dL    Comment: Performed at [facility], [address], [place], [place] [address]   No results found.    Assessment/Plan In transit melanoma metastasis - Left forearm  Patient seen and examined and relevant labs and imaging reviewed. Case and plan discussed with oncology. Patient with melanoma lesion (metastasis with unknown primary) amenable to surgical excision. Would need to be done with anesthesia in OR and given COPD and covid infection the risks outweigh benefits of this non emergent excision at this time. This was discussed with anesthesia. Depending on patient disposition could consider excision if still hospitalized once further out from COVID infection vs outpatient follow up once patient discharged to SNF. I discussed this with patient and will update his daughter at his request.  I reviewed ED provider notes, Consultant Oncology, neurosurgery notes, hospitalist notes, last 24 h vitals and pain scores, last 48 h intake and output, last 24 h labs and trends, and last 24 h imaging results.   Eric Form, [facility] 10/24/2022, 3:59 PM Please see Amion for pager number during day hours 7:00am-4:30pm

## 2022-10-24 NOTE — Progress Notes (Signed)
PROGRESS NOTE    Aaron Gibbs  RUE:[id number] DOB: [birth year] DOA: 10/17/2022 PCP: Smitty Cords, DO    Brief Narrative:   88 year old male with past medical history of blindness, chronic pain on narcotics, COPD, depression, GERD, hypertension who was recently discharged from [facility] yesterday presented to our hospital after sustaining a fall in the middle of the night.  Patient was brought into the hospital by EMS.  He has had 2 falls at home since discharge.  Of note, patient was recently admitted to [facility] on 10/11/2022 for COVID-19 infection.  In the ED, patient was mildly febrile, tachycardic.  Initial labs showed creatinine of 1.9.  COVID test was positive.  WBC mildly elevated at 11.6.  CT head was negative for acute abnormality.  CT C-spine showed displaced C2 fracture of the base of    CT scan of the abdomen and pelvis showed partially thrombosed infrarenal abdominal aortic aneurysm of 5 x 4.3 cm.  Neurosurgery was then consulted and patient was admitted hospital for acute kidney injury, C-spine fracture.    Assessment and Plan:  AKI (acute kidney injury)  Initial creatinine of 1.9.  Improved with Ringer lactate.    Latest creatinine of 0.8 .  Not on IV hydration at this time.  Hypokalemia.  Improved after replacement.  Latest potassium of 3.8   C2 cervical fracture  Neurosurgery was consulted.  CT angiogram of the neck showed cervical fracture.    At this time, the neurosurgery has recommended no surgical intervention and will need to continue soft collar for 6 weeks.     COVID-19 infection. Stable on room air.  Asymptomatic.  No need for treatment.  Lactic acidosis.  Present on admission.  Could be secondary to infection.  Initially received IV fluids.    Gram-positive bacteremia,  Blood cultures with coagulase-negative Staphylococcus species in 3 out of 4 bottles.  Likely contaminant cultures showing Staph epidermidis and  hominis at this time. Received  7-day course of vancomycin.    Procalcitonin less than 0.1.  Temperature max of 98.6 Fahrenheit today.     Fall at home, initial encounter Patient with frequent falls.  Palliative care on board for goals of care.  Physical therapy has recommended skilled nursing facility placement at this time.   Chronic heart failure with preserved ejection fraction (HCC) Continue Coreg.  Currently Bumex on hold.  Compensated at this time.  Patient is positive balance for 3560 ml.  Will restart Bumex every morning    COPD (chronic obstructive pulmonary disease) (HCC) Continue bronchodilators as needed.  Currently stable.  On room air.   Essential hypertension On Coreg twice daily.  Restart Bumex at a lower dose.   Chronic pain syndrome On buprenorphine patch 10 mcg/h.  Will continue.    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  Was recently diagnosed at [facility].  Plan is outpatient vascular surgery follow-up/CT MRI in 6 months.  FOBT positive. Initial hemoglobin of 11.4. No  obvious blood loss.  Ferritin at 38, serum iron 36.  Latest hemoglobin of 10.1.  Abdominal and left forearm lesion Has had biopsy done at [facility] dermatology with findings of melanoma.  Oncology has been consulted for further evaluation.  Patient's daughter wishes to hear about staging and prognosis.  CT scan of the chest, abdomen pelvis with contrast showed a 14 mm indeterminate lesion in the posterior interpolar region of the right kidney.  CT  head and C-spine and lumbar spine without contrast showed no evidence of metastasis.  Follow oncology recommendation   DVT prophylaxis: SCDs Start: 10/17/22 0537   Code Status:     Code Status: DNR  Disposition:   Plan for skilled nursing facility.  Status is: Inpatient  The patient is inpatient because: C2 fracture, deconditioning, debility,  need for  rehabilitation, oncology evaluation.   Family Communication:  Spoke with the patient's daughter [name] on the phone on [phone].   Consultants:  Neurosurgery Palliative care Oncology  Procedures:  Cervical collar  Antimicrobials:  Vancomycin IV-will discontinue.  Anti-infectives (From admission, onward)    Start     Dose/Rate Route Frequency Ordered Stop   10/18/22 0900  vancomycin (VANCOREADY) IVPB 1500 mg/300 mL        1,500 mg 150 mL/hr over 120 Minutes Intravenous Every 24 hours 10/18/22 0813 10/23/22 1247   10/17/22 2144  vancomycin variable dose per unstable renal function (pharmacist dosing)  Status:  Discontinued         Does not apply See admin instructions 10/17/22 2144 10/18/22 0817   10/17/22 0300  vancomycin (VANCOREADY) IVPB 2000 mg/400 mL        2,000 mg 200 mL/hr over 120 Minutes Intravenous  Once 10/17/22 0249 10/17/22 0815   10/17/22 0245  ceFEPIme (MAXIPIME) 2 g in sodium chloride 0.9 % 100 mL IVPB        2 g 200 mL/hr over 30 Minutes Intravenous  Once 10/17/22 0242 10/17/22 0535   10/17/22 0245  metroNIDAZOLE (FLAGYL) IVPB 500 mg        500 mg 100 mL/hr over 60 Minutes Intravenous  Once 10/17/22 0242 10/17/22 0430   10/17/22 0245  vancomycin (VANCOCIN) IVPB 1000 mg/200 mL premix  Status:  Discontinued        1,000 mg 200 mL/hr over 60 Minutes Intravenous  Once 10/17/22 0242 10/17/22 0249      Subjective: Today, patient was seen and examined at bedside.  Complains of mild neck pain.  Denies any nausea vomiting fever chills or rigors.    Objective: Vitals:   10/23/22 2149 10/24/22 0424 10/24/22 0800 10/24/22 0842  BP: (!) 133/94 (!) 181/101  (!) 169/144  Pulse: 80 92  (!) 105  Resp: 18 18  19   Temp: 98 F (36.7 C) 98.3 F (36.8 C)  98.6 F (37 C)  TempSrc:  Oral  Axillary  SpO2: 98% 96%  98%  Weight:   96.9 kg   Height:        Intake/Output Summary (Last 24 hours) at 10/24/2022 1204 Last data filed at 10/24/2022 7829 Gross per 24 hour  Intake  1228 ml  Output 850 ml  Net 378 ml    Filed Weights   10/19/22 0500 10/20/22 0526 10/24/22 0800  Weight: 91.7 kg 99 kg 96.9 kg    Physical Examination: Body mass index is 30.65 kg/m.   General: Alert awake and Communicative, obese, elderly male, cervical collar in place. HENT:   Cervical collar in place, blindness, Chest:   Diminished breath sounds bilaterally.  No wheezes or crackles. CVS: S1 &S2 heard. No murmur.  Regular rate and rhythm. Abdomen: Soft, nontender, nondistended.   Extremities: No cyanosis, clubbing or edema.   Psych: Alert awake and Communicative. CNS: Alert awake and Communicative, cervical collar in place.  Moves all the extremities.   Skin: forehead ulceration, left forearm with induration, right abdominal wall with ulceration.  Data Reviewed:   CBC: Recent Labs  Lab  10/18/22 0431 10/19/22 0422 10/20/22 0224 10/23/22 0149  WBC 4.3 3.7* 3.5* 3.9*  NEUTROABS 1.9  --   --   --   HGB 8.6* 9.0* 9.3* 10.1*  HCT 26.4* 28.1* 29.3* 31.5*  MCV 92.3 94.6 94.2 94.0  PLT 126* 134* 142* 150     Basic Metabolic Panel: Recent Labs  Lab 10/18/22 0431 10/19/22 0422 10/20/22 0224 10/22/22 0035 10/23/22 0149  NA 140 140 141 142 141  K 3.1* 3.5 3.7 3.8 3.8  CL 111 111 109 115* 112*  CO2 22 25 22 23 23   GLUCOSE 103* 104* 119* 114* 119*  BUN 25* 22 21 15 13   CREATININE 1.04 1.08 0.93 0.83 0.81  CALCIUM 8.1* 8.4* 8.7* 8.6* 8.7*  MG 1.9 2.1 2.0  --  2.2     Liver Function Tests: Recent Labs  Lab 10/18/22 0431  AST 24  ALT 26  ALKPHOS 41  BILITOT 1.3*  PROT 5.1*  ALBUMIN 2.6*     Radiology Studies: No results found.    LOS: 6 days    Joycelyn Das, MD [facility] Available via Epic secure chat 7am-7pm After these hours, please refer to coverage provider listed on [email or web address] 10/24/2022, 12:04 PM

## 2022-10-24 NOTE — Progress Notes (Signed)
Occupational Therapy Treatment Patient Details Name: Aaron Gibbs MRN: [id number] DOB: 10/22/1934 Today's Date: 10/24/2022   History of present illness 88 year old male who presented after a fall, found to have C2 fx. PMHx: blindness, chronic pain on narcotics, COPD, depression, GERD, hypertension, recently discharged from [facility] hospital (admitted for fall, found to have COVID)   OT comments  Pt sidelying in bed upon OT arrival. Pt agreeable to participation in skilled OT session, but significantly limited by pain in neck, back, and head throughout session. OT educated pt in techniques for increased safety and independence with ADLs, compensatory strategies for limiting pain during ADLs, and cervical precautions. Pt demonstrates ability to complete UB ADLs on this day with Max assist and LB ADLs with Total assist +2 for physical assistance and safety. Pt currently performing bed mobility with Min to Max assist. Pt will benefit from reinforcement of all training and continued acute skilled OT services. Pt is making poor progress toward goals secondary to pain. Pt continues to have fair potential to meet goals if pain level decreases/can be well managed. Acute OT to continue to follow. Discharge plan remains appropriate.    Recommendations for follow up therapy are one component of a multi-disciplinary discharge planning process, led by the attending physician.  Recommendations may be updated based on patient status, additional functional criteria and insurance authorization.    Assistance Recommended at Discharge Frequent or constant Supervision/Assistance  Patient can return home with the following  Two people to help with walking and/or transfers;Two people to help with bathing/dressing/bathroom;Assistance with cooking/housework;Assistance with feeding;Direct supervision/assist for medications management;Assist for transportation;Help with stairs or ramp for entrance   Equipment  Recommendations       Recommendations for Other Services      Precautions / Restrictions Precautions Precautions: Fall;Cervical Precaution Booklet Issued: No Precaution Comments: Blind Required Braces or Orthoses: Cervical Brace Cervical Brace: At all times;Soft collar Restrictions Weight Bearing Restrictions: No       Mobility Bed Mobility Overal bed mobility: Needs Assistance Bed Mobility: Rolling, Sidelying to Sit, Sit to Sidelying Rolling: Min assist, Max assist (Min assist rolling to R, Max assist rolling to L) Sidelying to sit: Min assist     Sit to sidelying: Mod assist General bed mobility comments: Mod assist to manage B LE to transfers sit to supine. Min assist to come to sitting. x5 supine/sit during ADLs and linen changing    Transfers                         Balance Overall balance assessment: Needs assistance Sitting-balance support: Feet supported Sitting balance-Leahy Scale: Fair Sitting balance - Comments: Limited tolerance sitting EOB secondary to pain                                   ADL either performed or assessed with clinical judgement   ADL Overall ADL's : Needs assistance/impaired     Grooming: Maximal assistance;Bed level   Upper Body Bathing: Maximal assistance;Cueing for safety;Cueing for sequencing;Bed level (Pt able to assist with bed mobility with Mod assist during UB bathing.)   Lower Body Bathing: Total assistance;+2 for physical assistance;+2 for safety/equipment;Bed level   Upper Body Dressing : Maximal assistance;Bed level;Cueing for sequencing                     General ADL Comments: Pt functional level signifiacntly limited by  pain. Pt only able to tolerate sitting EOB for periods <30 seconds at a time.    Extremity/Trunk Assessment Upper Extremity Assessment Upper Extremity Assessment: Generalized weakness            Vision       Perception     Praxis Praxis Praxis:  Impaired Praxis Impairment Details: Perseveration    Cognition Arousal/Alertness: Lethargic Behavior During Therapy: Flat affect, Restless, Anxious, Impulsive (Approaching agitation at points during session, but quickly calmed with returning to supine. Approaching agitation at end of session.) Overall Cognitive Status: Impaired/Different from baseline Area of Impairment: Attention, Safety/judgement, Awareness, Problem solving                   Current Attention Level: Sustained     Safety/Judgement: Decreased awareness of safety, Decreased awareness of deficits Awareness: Intellectual Problem Solving: Slow processing, Decreased initiation, Requires verbal cues General Comments: lethargic but easily rouses to voice, follows simple 1 step commands. As session progressed pt became more and more irritated, approaching agitation by end of session.        Exercises      Shoulder Instructions       General Comments VSS on RA.    Pertinent Vitals/ Pain       Pain Assessment Pain Assessment: Faces Faces Pain Scale: Hurts whole lot Pain Location: Back, neck, head Pain Descriptors / Indicators: Discomfort, Grimacing, Guarding, Moaning, Aching Pain Intervention(s): Limited activity within patient's tolerance, Monitored during session, Repositioned, Patient requesting pain meds-RN notified, Other (comment) (RN contacted prior to session regarding pt pain. RN stated no additional pain medication could be given at that time.)  Home Living                                          Prior Functioning/Environment              Frequency  Min 2X/week        Progress Toward Goals  OT Goals(current goals can now be found in the care plan section)  Progress towards OT goals: Not progressing toward goals - comment (Pt progress significantly affected by pt pain level. Pt continues to have fair potential to acheive goals if pain can be managed.)  Acute Rehab OT  Goals Patient Stated Goal: Lower pain level OT Goal Formulation: With patient Potential to Achieve Goals: Fair (If pain levels decrease/can be managed)  Plan Discharge plan remains appropriate    Co-evaluation    PT/OT/SLP Co-Evaluation/Treatment: Yes Reason for Co-Treatment: Complexity of the patient's impairments (multi-system involvement);For patient/therapist safety;Other (comment) (To address functional bed mobility during ADLs) PT goals addressed during session: Mobility/safety with mobility;Balance OT goals addressed during session: ADL's and self-care      AM-PAC OT "6 Clicks" Daily Activity     Outcome Measure   Help from another person eating meals?: A Lot Help from another person taking care of personal grooming?: A Little Help from another person toileting, which includes using toliet, bedpan, or urinal?: Total Help from another person bathing (including washing, rinsing, drying)?: A Lot Help from another person to put on and taking off regular upper body clothing?: A Lot Help from another person to put on and taking off regular lower body clothing?: Total 6 Click Score: 11    End of Session    OT Visit Diagnosis: Unsteadiness on feet (R26.81);Muscle weakness (generalized) (M62.81);Pain   Activity  Tolerance Patient limited by fatigue;Patient limited by lethargy;Patient limited by pain   Patient Left in bed;with call bell/phone within reach;with bed alarm set   Nurse Communication Mobility status;Patient requests pain meds        Time: 1207-1225 OT Time Calculation (min): 18 min  Charges: OT General Charges $OT Visit: 1 Visit OT Treatments $Self Care/Home Management : 8-22 mins  Breven Guidroz "Orson Eva., OTR/L, MA Acute Rehab [phone]   Lendon Colonel 10/24/2022, 1:01 PM

## 2022-10-24 NOTE — Progress Notes (Signed)
Physical Therapy Treatment Patient Details Name: Aaron Gibbs MRN: [id number] DOB: 03/03/1935 Today's Date: 10/24/2022   History of Present Illness 88 year old male who presented after a fall, found to have C2 fx. PMHx: blindness, chronic pain on narcotics, COPD, depression, GERD, hypertension, recently discharged from [facility] hospital (admitted for fall, found to have COVID)    PT Comments    Pt greeted sidelying in bed and agreeable to session with encouragement as pt continues to be limited by pain. Pt able to come to sitting EOB with min A to elevate trunk with pt demonstrating fair sitting balance without need for assist to maintain. Pt returning to sidelying quickly due to pain, however pt agreeable to come back to sitting with intermittent sidelying rest breaks x5 throughout session. Pt declining further mobility or exercise. Current plan remains appropriate to address deficits and maximize functional independence and decrease caregiver burden. Pt continues to benefit from skilled PT services to progress toward functional mobility goals.    Recommendations for follow up therapy are one component of a multi-disciplinary discharge planning process, led by the attending physician.  Recommendations may be updated based on patient status, additional functional criteria and insurance authorization.  Follow Up Recommendations  Can patient physically be transported by private vehicle: No    Assistance Recommended at Discharge Frequent or constant Supervision/Assistance  Patient can return home with the following Assist for transportation;Help with stairs or ramp for entrance;Assistance with feeding;Two people to help with walking and/or transfers;Two people to help with bathing/dressing/bathroom;Assistance with cooking/housework;Direct supervision/assist for medications management;Direct supervision/assist for financial management   Equipment Recommendations  None recommended by PT  (TBD by next venue of care)    Recommendations for Other Services       Precautions / Restrictions Precautions Precautions: Fall;Cervical Precaution Booklet Issued: No Precaution Comments: blind, COVID Required Braces or Orthoses: Cervical Brace Cervical Brace: At all times;Soft collar (x6 weeks) Restrictions Weight Bearing Restrictions: No     Mobility  Bed Mobility Overal bed mobility: Needs Assistance Bed Mobility: Rolling, Sidelying to Sit, Sit to Sidelying Rolling: Min guard Sidelying to sit: Min assist     Sit to sidelying: Mod assist General bed mobility comments: mod A to manage LEs to return to supine, min A to elevate trunk to sitting, pt completing x5 throughout session for ADLs and bed linen change, min A to roll to R, max A to roll to L    Transfers                   General transfer comment: Pt refused    Ambulation/Gait               General Gait Details: Pt refused   Stairs             Wheelchair Mobility    Modified Rankin (Stroke Patients Only)       Balance Overall balance assessment: Needs assistance Sitting-balance support: Feet supported Sitting balance-Leahy Scale: Fair Sitting balance - Comments: flexed posture, but able to maintain without assist for short amounts of time due to neck pain                                    Cognition Arousal/Alertness: Lethargic Behavior During Therapy: Flat affect (Approaching agitation as session progressed) Overall Cognitive Status: Impaired/Different from baseline Area of Impairment: Attention, Safety/judgement, Awareness, Problem solving  Current Attention Level: Sustained     Safety/Judgement: Decreased awareness of safety, Decreased awareness of deficits Awareness: Intellectual Problem Solving: Slow processing, Decreased initiation, Requires verbal cues General Comments: lethargic but easily rouses to voice, follows simple 1  step commands. As session progressed pt became more and more irritated, approaching agitation by end of session.        Exercises      General Comments General comments (skin integrity, edema, etc.): VSS on RA      Pertinent Vitals/Pain Pain Assessment Pain Assessment: Faces Faces Pain Scale: Hurts even more Pain Location: neck Pain Descriptors / Indicators: Discomfort Pain Intervention(s): Monitored during session, Limited activity within patient's tolerance, Patient requesting pain meds-RN notified    Home Living                          Prior Function            PT Goals (current goals can now be found in the care plan section) Acute Rehab PT Goals Patient Stated Goal: Decrease pain PT Goal Formulation: With patient Time For Goal Achievement: 11/02/22 Progress towards PT goals: Progressing toward goals    Frequency    Min 2X/week      PT Plan Current plan remains appropriate    Co-evaluation PT/OT/SLP Co-Evaluation/Treatment: Yes Reason for Co-Treatment: Complexity of the patient's impairments (multi-system involvement);To address functional/ADL transfers PT goals addressed during session: Mobility/safety with mobility;Balance        AM-PAC PT "6 Clicks" Mobility   Outcome Measure  Help needed turning from your back to your side while in a flat bed without using bedrails?: A Little (to R) Help needed moving from lying on your back to sitting on the side of a flat bed without using bedrails?: A Little Help needed moving to and from a bed to a chair (including a wheelchair)?: Total Help needed standing up from a chair using your arms (e.g., wheelchair or bedside chair)?: Total Help needed to walk in hospital room?: Total Help needed climbing 3-5 steps with a railing? : Total 6 Click Score: 10    End of Session Equipment Utilized During Treatment: Cervical collar Activity Tolerance: Patient limited by pain Patient left: in bed;with call  bell/phone within reach;with bed alarm set Nurse Communication: Mobility status;Patient requests pain meds PT Visit Diagnosis: Other abnormalities of gait and mobility (R26.89);Muscle weakness (generalized) (M62.81)     Time: 1202-1228 PT Time Calculation (min) (ACUTE ONLY): 26 min  Charges:  $Therapeutic Activity: 8-22 mins                     Mikaelyn Arthurs R. PTA Acute Rehabilitation Services Office: [phone]    Catalina Antigua 10/24/2022, 12:46 PM

## 2022-10-24 NOTE — Progress Notes (Signed)
Speech Language Pathology Treatment: Dysphagia  Patient Details Name: Aaron Gibbs MRN: [id number] DOB: 08-09-1934 Today's Date: 10/24/2022 Time: 1010-1018 SLP Time Calculation (min) (ACUTE ONLY): 8 min  Assessment / Plan / Recommendation Clinical Impression  Pt seen for dysphagia without family at bedside and agreeable to po's. He stated he drank some juice this morning. Last week pt only wanted to drink Ensure for meals and snacks. With encouragement he declined to have head of bed raised to adequate level due to pain. Initial sips from straw taken without difficulty. Following solid trial with cracker he did have one mild cough possible due to particulate with cracker or positioning. Pt educated on importance of sitting in upright position with meals/snacks/meds. Recommend continue regular texture, thin liquids, upright and pills whole in puree. Overall his swallowing appears safe especially if following precautions and biggest issue is his low intake. Pt is followed by Palliative care. At this point ST will sign off. Staff to continue to encourage po intake in upright position.    HPI HPI: Patient is an 88 y.o. male with PMH: blindness, chronic pain on chronic narcotics, COPD, depression, GERD, HTN. He was discharged home from [facility] on 10/16/22 (had been admitted since 4/24 with COVID-19) where he has 24/7 caregiver assistance. He presented to [facility] ER on 10/17/22 after a fall and per EMS report, patient has had two falls since discharge home. In ER patient was very agitated and required IV sedation. CT head negative for acute intracranial abnormality. C-spine CT showed minimally displaced C2 fracture of the base of the odontoid. SLP swallow evaluation ordered on 10/18/22 as patient was coughing when taking pills.      SLP Plan  All goals met;Discharge SLP treatment due to (comment)      Recommendations for follow up therapy are one component of a multi-disciplinary discharge planning process, led  by the attending physician.  Recommendations may be updated based on patient status, additional functional criteria and insurance authorization.    Recommendations  Diet recommendations: Regular;Thin liquid Liquids provided via: Straw Medication Administration: Whole meds with puree Supervision: Staff to assist with self feeding Compensations: Slow rate;Small sips/bites Postural Changes and/or Swallow Maneuvers: Seated upright 90 degrees                  Oral care BID   Frequent or constant Supervision/Assistance Dysphagia, unspecified (R13.10)     All goals met;Discharge SLP treatment due to (comment)     Royce Macadamia  10/24/2022, 10:22 AM

## 2022-10-25 ENCOUNTER — Inpatient Hospital Stay (HOSPITAL_COMMUNITY): Payer: Medicare Other

## 2022-10-25 DIAGNOSIS — N179 Acute kidney failure, unspecified: Secondary | ICD-10-CM | POA: Diagnosis not present

## 2022-10-25 DIAGNOSIS — S12191A Other nondisplaced fracture of second cervical vertebra, initial encounter for closed fracture: Secondary | ICD-10-CM | POA: Diagnosis not present

## 2022-10-25 MED ORDER — BUMETANIDE 0.5 MG PO TABS
0.5000 mg | ORAL_TABLET | Freq: Two times a day (BID) | ORAL | Status: DC
  Administered 2022-10-25 – 2022-10-30 (×11): 0.5 mg via ORAL
  Filled 2022-10-25 (×12): qty 1

## 2022-10-25 MED ORDER — IOHEXOL 350 MG/ML SOLN
75.0000 mL | Freq: Once | INTRAVENOUS | Status: AC | PRN
  Administered 2022-10-25: 75 mL via INTRAVENOUS

## 2022-10-25 NOTE — Progress Notes (Signed)
PROGRESS NOTE    Aaron Gibbs  ZOX:[id number] DOB: [birth year] DOA: 10/17/2022 PCP: Smitty Cords, DO   Brief Narrative: Aaron Gibbs is a 88 y.o. male with a history of blindness, chronic pain, COPD, depression, GERD, hypertension. Patient presented secondary to a fall and found to have a C2 vertebral fracture. Neurosurgery consulted and recommended no surgical management. Patient with associated AKI that responded well to IV fluids. Plan to discharge to SNF.  Assessment and Plan:  AKI Creatinine of 1.90 on admission. Resolved with IV fluids.  Hypokalemia Low potassium of 3.1. Resolved with supplementation.  C2 cervical fracture Secondary to fall. Minimally displaced fracture noted on CT. Neurosurgery consulted and recommended soft collar for stabilization x6 weeks. CT angio of neck recommended but not ordered secondary to AKI. -Continue soft collar -Obtain CT angio of the neck  COVID-19 infection Previously diagnosed prior to admission on 4/24. Asymptomatic.  Lactic acidosis Present on admission. Improved with IV fluids.  Positive blood cultures Patient with blood cultures positive for staphylococcus epidermidis and staphylococcus hominis in separate sets of blood cultures.  Fall Unclear etiology. Polypharmacy is a possible etiology for falls.  Chronic HFpEF Stable. -Continue Coreg and Bumex  COPD Stable.  Primary hypertension -Continue Coreg  Chronic pain -Continue buprenorphine patch  Infrarenal abdominal aortic aneurysm Chronic. Outpatient vascular surgery follow-up  FOBT positive Noted on admission. No overt melena or hematochezia noted. Hemoglobin stable. Can follow-up outpatient.  Depression -Continue Cymbalta  Metastatic melanoma Recently diagnosed. Consideration for surgical excision while inpatient per oncology recommendations. General surgery consulted and recommended to defer surgical excision to outpatient.    DVT  prophylaxis: SCDs Code Status:   Code Status: DNR Family Communication: Friend at bedside. Called daughter via telephone but no response Disposition Plan: Discharge to SNF when bed is available   Consultants:  Neurosurgery Medical oncology Palliative care medicine General surgery  Procedures:  None  Antimicrobials: Vancomycin Cefepime Flagyl    Subjective: Patient reports neck pain. No other concerns.  Objective: BP (!) 169/70 (BP Location: Left Arm)   Pulse 72   Temp 98.4 F (36.9 C) (Oral)   Resp 18   Ht 5\' 10"  (1.778 m)   Wt 96.9 kg   SpO2 95%   BMI 30.65 kg/m   Examination:  General exam: Appears calm. Lying in bed. Respiratory system: Clear to auscultation. Respiratory effort normal. Cardiovascular system: S1 & S2 heard, RRR. Gastrointestinal system: Abdomen is nondistended, soft and nontender. Normal bowel sounds heard. Central nervous system: Alert and oriented. No focal neurological deficits. Musculoskeletal: No edema. No calf tenderness Skin: No cyanosis. No rashes Psychiatry: Judgement and insight appear normal. Mood & affect appropriate.    Data Reviewed: I have personally reviewed following labs and imaging studies  CBC Lab Results  Component Value Date   WBC 3.9 (L) 10/23/2022   RBC 3.35 (L) 10/23/2022   HGB 10.1 (L) 10/23/2022   HCT 31.5 (L) 10/23/2022   MCV 94.0 10/23/2022   MCH 30.1 10/23/2022   PLT 150 10/23/2022   MCHC 32.1 10/23/2022   RDW 17.9 (H) 10/23/2022   LYMPHSABS 0.9 10/18/2022   MONOABS 1.3 (H) 10/18/2022   EOSABS 0.1 10/18/2022   BASOSABS 0.0 10/18/2022     Last metabolic panel Lab Results  Component Value Date   NA 141 10/23/2022   K 3.8 10/23/2022   CL 112 (H) 10/23/2022   CO2 23 10/23/2022   BUN 13 10/23/2022   CREATININE 0.81 10/23/2022   GLUCOSE 119 (H) 10/23/2022  GFRNONAA >60 10/23/2022   GFRAA >60 02/09/2020   CALCIUM 8.7 (L) 10/23/2022   PHOS 3.4 03/30/2019   PROT 5.1 (L) 10/18/2022   ALBUMIN  2.6 (L) 10/18/2022   LABGLOB 2.7 06/09/2022   AGRATIO 2.1 07/10/2018   BILITOT 1.3 (H) 10/18/2022   ALKPHOS 41 10/18/2022   AST 24 10/18/2022   ALT 26 10/18/2022   ANIONGAP 6 10/23/2022    GFR: Estimated Creatinine Clearance: 77.9 mL/min (by C-G formula based on SCr of 0.81 mg/dL).  Recent Results (from the past 240 hour(s))  Resp panel by RT-PCR (RSV, Flu A&B, Covid) Anterior Nasal Swab     Status: Abnormal   Collection Time: 10/17/22  2:27 AM   Specimen: Anterior Nasal Swab  Result Value Ref Range Status   SARS Coronavirus 2 by RT PCR POSITIVE (A) NEGATIVE Final   Influenza A by PCR NEGATIVE NEGATIVE Final   Influenza B by PCR NEGATIVE NEGATIVE Final    Comment: (NOTE) The Xpert Xpress SARS-CoV-2/FLU/RSV plus assay is intended as an aid in the diagnosis of influenza from Nasopharyngeal swab specimens and should not be used as a sole basis for treatment. Nasal washings and aspirates are unacceptable for Xpert Xpress SARS-CoV-2/FLU/RSV testing.  Fact Sheet for Patients: BloggerCourse.com  Fact Sheet for Healthcare Providers: SeriousBroker.it  This test is not yet approved or cleared by the [place] FDA and has been authorized for detection and/or diagnosis of SARS-CoV-2 by FDA under an Emergency Use Authorization (EUA). This EUA will remain in effect (meaning this test can be used) for the duration of the COVID-19 declaration under Section 564(b)(1) of the Act, 21 U.S.C. section 360bbb-3(b)(1), unless the authorization is terminated or revoked.     Resp Syncytial Virus by PCR NEGATIVE NEGATIVE Final    Comment: (NOTE) Fact Sheet for Patients: BloggerCourse.com  Fact Sheet for Healthcare Providers: SeriousBroker.it  This test is not yet approved or cleared by the [place] FDA and has been authorized for detection and/or diagnosis of SARS-CoV-2 by FDA under an  Emergency Use Authorization (EUA). This EUA will remain in effect (meaning this test can be used) for the duration of the COVID-19 declaration under Section 564(b)(1) of the Act, 21 U.S.C. section 360bbb-3(b)(1), unless the authorization is terminated or revoked.  Performed at [name], [address], [place], [place] [address]   Blood Culture (routine x 2)     Status: Abnormal   Collection Time: 10/17/22  2:33 AM   Specimen: BLOOD RIGHT ARM  Result Value Ref Range Status   Specimen Description BLOOD RIGHT ARM  Final   Special Requests   Final    BOTTLES DRAWN AEROBIC AND ANAEROBIC Blood Culture adequate volume   Culture  Setup Time   Final    GRAM POSITIVE COCCI IN CLUSTERS IN BOTH AEROBIC AND ANAEROBIC BOTTLES CRITICAL RESULT CALLED TO, READ BACK BY AND VERIFIED WITH: PHARMD T. DANG [id number] @2046  FH    Culture (A)  Final    STAPHYLOCOCCUS HOMINIS THE SIGNIFICANCE OF ISOLATING THIS ORGANISM FROM A SINGLE SET OF BLOOD CULTURES WHEN MULTIPLE SETS ARE DRAWN IS UNCERTAIN. PLEASE NOTIFY THE MICROBIOLOGY DEPARTMENT WITHIN ONE WEEK IF SPECIATION AND SENSITIVITIES ARE REQUIRED. Performed at [facility], [address] [place], [place] [address]    Report Status 10/19/2022 FINAL  Final  Blood Culture (routine x 2)     Status: Abnormal   Collection Time: 10/17/22  2:33 AM   Specimen: BLOOD LEFT ARM  Result Value Ref  Range Status   Specimen Description BLOOD LEFT ARM  Final   Special Requests   Final    BOTTLES DRAWN AEROBIC AND ANAEROBIC Blood Culture adequate volume   Culture  Setup Time   Final    GRAM POSITIVE COCCI ANAEROBIC BOTTLE ONLY CRITICAL VALUE NOTED.  VALUE IS CONSISTENT WITH PREVIOUSLY REPORTED AND CALLED VALUE.    Culture (A)  Final    STAPHYLOCOCCUS EPIDERMIDIS THE SIGNIFICANCE OF ISOLATING THIS ORGANISM FROM A SINGLE SET OF BLOOD CULTURES WHEN MULTIPLE SETS ARE DRAWN IS UNCERTAIN. PLEASE NOTIFY THE MICROBIOLOGY DEPARTMENT WITHIN ONE WEEK IF SPECIATION  AND SENSITIVITIES ARE REQUIRED. Performed at [facility], [address] [place], [place] [address]    Report Status 10/19/2022 FINAL  Final  Blood Culture ID Panel (Reflexed)     Status: Abnormal   Collection Time: 10/17/22  2:33 AM  Result Value Ref Range Status   Enterococcus faecalis NOT DETECTED NOT DETECTED Final   Enterococcus Faecium NOT DETECTED NOT DETECTED Final   Listeria monocytogenes NOT DETECTED NOT DETECTED Final   Staphylococcus species DETECTED (A) NOT DETECTED Final    Comment: CRITICAL RESULT CALLED TO, READ BACK BY AND VERIFIED WITH: PHARMD T. DANG [id number] @2046  FH    Staphylococcus aureus (BCID) NOT DETECTED NOT DETECTED Final   Staphylococcus epidermidis NOT DETECTED NOT DETECTED Final   Staphylococcus lugdunensis NOT DETECTED NOT DETECTED Final   Streptococcus species NOT DETECTED NOT DETECTED Final   Streptococcus agalactiae NOT DETECTED NOT DETECTED Final   Streptococcus pneumoniae NOT DETECTED NOT DETECTED Final   Streptococcus pyogenes NOT DETECTED NOT DETECTED Final   A.calcoaceticus-baumannii NOT DETECTED NOT DETECTED Final   Bacteroides fragilis NOT DETECTED NOT DETECTED Final   Enterobacterales NOT DETECTED NOT DETECTED Final   Enterobacter cloacae complex NOT DETECTED NOT DETECTED Final   Escherichia coli NOT DETECTED NOT DETECTED Final   Klebsiella aerogenes NOT DETECTED NOT DETECTED Final   Klebsiella oxytoca NOT DETECTED NOT DETECTED Final   Klebsiella pneumoniae NOT DETECTED NOT DETECTED Final   Proteus species NOT DETECTED NOT DETECTED Final   Salmonella species NOT DETECTED NOT DETECTED Final   Serratia marcescens NOT DETECTED NOT DETECTED Final   Haemophilus influenzae NOT DETECTED NOT DETECTED Final   Neisseria meningitidis NOT DETECTED NOT DETECTED Final   Pseudomonas aeruginosa NOT DETECTED NOT DETECTED Final   Stenotrophomonas maltophilia NOT DETECTED NOT DETECTED Final   Candida albicans NOT DETECTED NOT DETECTED Final   Candida  auris NOT DETECTED NOT DETECTED Final   Candida glabrata NOT DETECTED NOT DETECTED Final   Candida krusei NOT DETECTED NOT DETECTED Final   Candida parapsilosis NOT DETECTED NOT DETECTED Final   Candida tropicalis NOT DETECTED NOT DETECTED Final   Cryptococcus neoformans/gattii NOT DETECTED NOT DETECTED Final    Comment: Performed at [facility], [address], [place], [place] [address]      Radiology Studies: No results found.    LOS: 7 days    Jacquelin Hawking, MD [facility] 10/25/2022, 12:51 PM   If 7PM-7AM, please contact night-coverage [email or web address]

## 2022-10-25 NOTE — Plan of Care (Signed)
Patient AOX3, disoriented to situation, VSS throughout shift.  Blind in both eyes.  All meds given on time as ordered.  Pt p/o pain relieved by PRN pain meds.  Purewick in place, output WDLs.  Pt was diaphoretic o/n but remained afebrile.  POC maintained, will continue to monitor.  Problem: Education: Goal: Knowledge of General Education information will improve Description: Including pain rating scale, medication(s)/side effects and non-pharmacologic comfort measures Outcome: Progressing   Problem: Health Behavior/Discharge Planning: Goal: Ability to manage health-related needs will improve Outcome: Progressing   Problem: Clinical Measurements: Goal: Ability to maintain clinical measurements within normal limits will improve Outcome: Progressing Goal: Will remain free from infection Outcome: Progressing Goal: Respiratory complications will improve Outcome: Progressing Goal: Cardiovascular complication will be avoided Outcome: Progressing   Problem: Activity: Goal: Risk for activity intolerance will decrease Outcome: Progressing   Problem: Nutrition: Goal: Adequate nutrition will be maintained Outcome: Progressing   Problem: Coping: Goal: Level of anxiety will decrease Outcome: Progressing   Problem: Elimination: Goal: Will not experience complications related to bowel motility Outcome: Progressing Goal: Will not experience complications related to urinary retention Outcome: Progressing   Problem: Pain Managment: Goal: General experience of comfort will improve Outcome: Progressing   Problem: Safety: Goal: Ability to remain free from injury will improve Outcome: Progressing   Problem: Skin Integrity: Goal: Risk for impaired skin integrity will decrease Outcome: Progressing   Problem: Safety: Goal: Non-violent Restraint(s) Outcome: Progressing

## 2022-10-25 NOTE — Progress Notes (Signed)
Daily Progress Note   Patient Name: Aaron Gibbs       Date: 10/25/2022 DOB: Oct 26, 1934  Age: 88 y.o. MRN#: [id number] Attending Physician: Narda Bonds, MD Primary Care Physician: Smitty Cords, DO Admit Date: 10/17/2022  Reason for Consultation/Follow-up: Establishing goals of care  Patient Profile/HPI:  88 year old male with past medical history of blindness, chronic pain on narcotics, COPD, depression, GERD, hypertension who was recently discharged from [facility] hospital yesterday presented to our hospital after sustaining a fall in the middle of the night.  Patient was brought into the hospital by EMS.  He has had 2 falls at home since discharge.  Of note patient was recently admitted to [facility] hospital on 10/11/2022 for COVID-19 infection.  In the ED, patient was mildly febrile, tachycardic.  Initial labs showed creatinine of 1.9.  COVID test was positive.  WBC mildly elevated at 11.6.  CT head was negative for acute abnormality.  CT C-spine showed displaced C2 fracture of the base of   Radiology recommended CTA of the neck for assessment of right vertebral artery.  CT scan of the abdomen and pelvis showed partially thrombosed infrarenal abdominal aortic aneurysm of 5 x 4.3 cm.  Neurosurgery was then consulted and patient was admitted hospital for acute kidney injury, C-spine fracture.   Blood cultures positive- being treated for bacteremia.   Subjective: Chart reviewed including labs, progress notes, imaging from this and previous encounters.  On eval patient is sleeping. Sitter at bedside reports he has not wanted to eat.  Noted oncology and surgery recommendations.  Called [name] for continued followup.  Plan is to continue current medical interventions. She  is hopeful for patient to be accepted at [facility] for rehab, then transition to LTC.  We discussed need for ongoing GOC discussion for when he declines in the future. Recommend outpatient Palliative to follow. [name] is in agreement. She hopes to be at bedside on Saturday for further discussion with patient regarding his goals of care.    Review of Systems  Unable to perform ROS: Other     Physical Exam Vitals and nursing note reviewed.  Cardiovascular:     Rate and Rhythm: Normal rate.  Pulmonary:     Effort: Pulmonary effort is normal.  Neurological:     Comments: sleeping  Vital Signs: BP (!) 169/70 (BP Location: Left Arm)   Pulse 72   Temp 98.4 F (36.9 C) (Oral)   Resp 18   Ht 5\' 10"  (1.778 m)   Wt 96.9 kg   SpO2 95%   BMI 30.65 kg/m  SpO2: SpO2: 95 % O2 Device: O2 Device: Room Air O2 Flow Rate: O2 Flow Rate (L/min): 2 L/min  Intake/output summary:  Intake/Output Summary (Last 24 hours) at 10/25/2022 1256 Last data filed at 10/25/2022 0800 Gross per 24 hour  Intake 220 ml  Output 700 ml  Net -480 ml    LBM: Last BM Date : 10/22/22 Baseline Weight: Weight: 95.3 kg Most recent weight: Weight: 96.9 kg       Palliative Assessment/Data: PPS: 30%      Patient Active Problem List   Diagnosis Date Noted   DNR (do not resuscitate) 10/17/2022   AKI (acute kidney injury) (HCC) 10/17/2022   C2 cervical fracture (HCC) 10/17/2022   Infrarenal abdominal aortic aneurysm (AAA) without rupture (HCC) 10/17/2022   Hypokalemia 10/17/2022   COVID-19 10/11/2022   Thrombocytopenia (HCC) 10/11/2022   Chronic heart failure with preserved ejection fraction (HCC) 09/13/2022   Palliative care patient 07/17/2022   Other pancytopenia (HCC) 06/09/2022   Unable to maintain body in lying position 05/24/2022   Benign paroxysmal positional vertigo    Weakness 01/29/2022   Sensorineural hearing loss (SNHL) of both ears 07/25/2021   Pain in right knee 03/15/2021    Other chronic pain 03/15/2021   Chronic knee pain (Left) 09/28/2020   Abnormal MRI, knee (08/05/2018) (Left) 09/13/2020   Long-term current use of benzodiazepine 08/09/2020   Opioid dependence, binge pattern (HCC) 08/09/2020   Opioid dependence with or without physiological dependence (HCC) 08/08/2020   History of substance use disorder 04/15/2020   Osteoarthritis of knee (Left) 03/23/2020   Aortic atherosclerosis (HCC) 02/05/2020   Generalized anxiety disorder with panic attacks 02/04/2020   Depression with anxiety 12/13/2019   Depression 12/12/2019   COPD (chronic obstructive pulmonary disease) (HCC)    HLD (hyperlipidemia)    Iron deficiency anemia    Fall at home, initial encounter    Medial meniscal tear, sequela (Left) 06/10/2019   Patellar tendinosis (Right) 06/10/2019   Lateral meniscal tear, sequela (Left) 03/03/2019   Palpitation 02/13/2019   Preop testing 11/14/2018   Noncompliance with medication treatment due to overuse of medication 10/23/2018   Neurogenic pain 08/20/2018   Atherosclerotic peripheral vascular disease (HCC) 07/24/2018   Tricompartment osteoarthritis of knee (Left) 07/24/2018   Osteoarthritis of knee (Bilateral) 07/24/2018   Osteoarthritis of patellofemoral joint (Right) 07/24/2018   History of suicide attempt (06/12/18) 07/24/2018   Long term current use of opiate analgesic 07/10/2018   Pharmacologic therapy 07/10/2018   Disorder of skeletal system 07/10/2018   Problems influencing health status 07/10/2018   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   Major depressive disorder, recurrent episode, severe (HCC) 06/04/2018   Blindness 05/10/2018   Chronic pain syndrome 05/01/2018   DISH (diffuse idiopathic skeletal hyperostosis) 05/01/2018   Osteoarthritis of multiple joints 05/01/2018   Chronic knee pain (1ry area of Pain) (Bilateral) (L>R) 05/01/2018   Retinitis pigmentosa of both eyes 05/01/2018   Glaucoma of both eyes 05/01/2018   Essential  hypertension 05/01/2018   Chronic low back pain (Bilateral) w/o sciatica 05/01/2018   GERD (gastroesophageal reflux disease) 05/01/2018   AVM (arteriovenous malformation) of colon 05/01/2018   Therapeutic opioid-induced constipation (OIC) 05/01/2018    Palliative Care Assessment & Plan  Assessment/Recommendations/Plan  Continue current care TOC order for outpatient Palliative referral at rehab- family hopeful for SNF at [facility], the LTC   Code Status: DNR  Prognosis:  Unable to determine  Discharge Planning: To Be Determined  Care plan was discussed with patient's daughter.   Thank you for allowing the Palliative Medicine Team to assist in the care of this patient.  Total time:  55 mins  Greater than 50%  of this time was spent counseling and coordinating care related to the above assessment and plan.  Ocie Bob, AGNP-C Palliative Medicine   Please contact Palliative Medicine Team phone at [phone] for questions and concerns.

## 2022-10-25 NOTE — [place] FL2 (Signed)
[place] MEDICAID FL2 LEVEL OF CARE FORM     IDENTIFICATION  Patient Name: Aaron Gibbs Birthdate: 10/05/1934 Sex: male Admission Date (Current Location): 10/17/2022  [facility] and [place] Number:  Producer, television/film/video and Address:  [facility] [place], [address] [place], [place] [address]      Provider Number: [id number]  Attending Physician Name and Address:  Narda Bonds, MD  Relative Name and Phone Number:  Kyus, Zerkel (Daughter) [phone]    Current Level of Care: Hospital Recommended Level of Care: Skilled Nursing Facility Prior Approval Number:    Date Approved/Denied:   PASRR Number: [id number]  Discharge Plan: SNF    Current Diagnoses: Patient Active Problem List   Diagnosis Date Noted   DNR (do not resuscitate) 10/17/2022   AKI (acute kidney injury) (HCC) 10/17/2022   C2 cervical fracture (HCC) 10/17/2022   Infrarenal abdominal aortic aneurysm (AAA) without rupture (HCC) 10/17/2022   Hypokalemia 10/17/2022   COVID-19 10/11/2022   Thrombocytopenia (HCC) 10/11/2022   Chronic heart failure with preserved ejection fraction (HCC) 09/13/2022   Palliative care patient 07/17/2022   Other pancytopenia (HCC) 06/09/2022   Unable to maintain body in lying position 05/24/2022   Benign paroxysmal positional vertigo    Weakness 01/29/2022   Sensorineural hearing loss (SNHL) of both ears 07/25/2021   Pain in right knee 03/15/2021   Other chronic pain 03/15/2021   Chronic knee pain (Left) 09/28/2020   Abnormal MRI, knee (08/05/2018) (Left) 09/13/2020   Long-term current use of benzodiazepine 08/09/2020   Opioid dependence, binge pattern (HCC) 08/09/2020   Opioid dependence with or without physiological dependence (HCC) 08/08/2020   History of substance use disorder 04/15/2020   Osteoarthritis of knee (Left) 03/23/2020   Aortic atherosclerosis (HCC) 02/05/2020   Generalized anxiety disorder with panic attacks 02/04/2020   Depression  with anxiety 12/13/2019   Depression 12/12/2019   COPD (chronic obstructive pulmonary disease) (HCC)    HLD (hyperlipidemia)    Iron deficiency anemia    Fall at home, initial encounter    Medial meniscal tear, sequela (Left) 06/10/2019   Patellar tendinosis (Right) 06/10/2019   Lateral meniscal tear, sequela (Left) 03/03/2019   Palpitation 02/13/2019   Preop testing 11/14/2018   Noncompliance with medication treatment due to overuse of medication 10/23/2018   Neurogenic pain 08/20/2018   Atherosclerotic peripheral vascular disease (HCC) 07/24/2018   Tricompartment osteoarthritis of knee (Left) 07/24/2018   Osteoarthritis of knee (Bilateral) 07/24/2018   Osteoarthritis of patellofemoral joint (Right) 07/24/2018   History of suicide attempt (06/12/18) 07/24/2018   Long term current use of opiate analgesic 07/10/2018   Pharmacologic therapy 07/10/2018   Disorder of skeletal system 07/10/2018   Problems influencing health status 07/10/2018   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   Major depressive disorder, recurrent episode, severe (HCC) 06/04/2018   Blindness 05/10/2018   Chronic pain syndrome 05/01/2018   DISH (diffuse idiopathic skeletal hyperostosis) 05/01/2018   Osteoarthritis of multiple joints 05/01/2018   Chronic knee pain (1ry area of Pain) (Bilateral) (L>R) 05/01/2018   Retinitis pigmentosa of both eyes 05/01/2018   Glaucoma of both eyes 05/01/2018   Essential hypertension 05/01/2018   Chronic low back pain (Bilateral) w/o sciatica 05/01/2018   GERD (gastroesophageal reflux disease) 05/01/2018   AVM (arteriovenous malformation) of colon 05/01/2018   Therapeutic opioid-induced constipation (OIC) 05/01/2018    Orientation RESPIRATION BLADDER Height & Weight     Self, Time, Place  Normal Incontinent, External catheter Weight: 213 lb 10 oz (96.9  kg) Height:  5\' 10"  (177.8 cm)  BEHAVIORAL SYMPTOMS/MOOD NEUROLOGICAL BOWEL NUTRITION STATUS      Continent Diet (see discharge  summary)  AMBULATORY STATUS COMMUNICATION OF NEEDS Skin   Total Care Verbally Normal                       Personal Care Assistance Level of Assistance  Bathing, Feeding, Dressing Bathing Assistance: Maximum assistance Feeding assistance: Limited assistance Dressing Assistance: Maximum assistance     Functional Limitations Info  Sight, Hearing, Speech Sight Info: Impaired Hearing Info: Adequate Speech Info: Adequate    SPECIAL CARE FACTORS FREQUENCY  PT (By licensed PT), OT (By licensed OT)     PT Frequency: 5x/ week OT Frequency: 5x/ week            Contractures Contractures Info: Not present    Additional Factors Info  Code Status, Psychotropic, Allergies Code Status Info: DNR Allergies Info: Anthralin  Azithromycin Psychotropic Info: OLANZapine (ZYPREXA) tablet 2.5 mg         Current Medications (10/25/2022):  This is the current hospital active medication list Current Facility-Administered Medications  Medication Dose Route Frequency Provider Last Rate Last Admin   acetaminophen (TYLENOL) tablet 650 mg  650 mg Oral Q6H PRN Carollee Herter, DO   650 mg at 10/24/22 0425   Or   acetaminophen (TYLENOL) suppository 650 mg  650 mg Rectal Q6H PRN Carollee Herter, DO       acetaminophen (TYLENOL) tablet 650 mg  650 mg Oral TID Barbara Cower, NP   650 mg at 10/25/22 0939   buprenorphine (BUTRANS) 10 MCG/HR 1 patch  1 patch Transdermal Weekly Carollee Herter, DO   1 patch at 10/25/22 0941   carvedilol (COREG) tablet 12.5 mg  12.5 mg Oral BID Carollee Herter, DO   12.5 mg at 10/25/22 1610   DULoxetine (CYMBALTA) DR capsule 90 mg  90 mg Oral Daily Carollee Herter, DO   90 mg at 10/25/22 9604   feeding supplement (ENSURE ENLIVE / ENSURE PLUS) liquid 237 mL  237 mL Oral TID AC & HS Barbara Cower, NP   237 mL at 10/25/22 0941   finasteride (PROSCAR) tablet 5 mg  5 mg Oral Daily Carollee Herter, DO   5 mg at 10/25/22 5409   gabapentin (NEURONTIN) capsule 300 mg  300 mg Oral Daily Carollee Herter, DO   300  mg at 10/25/22 8119   hydrALAZINE (APRESOLINE) injection 10 mg  10 mg Intravenous Q6H PRN Pokhrel, Laxman, MD       HYDROmorphone (DILAUDID) injection 0.5-1 mg  0.5-1 mg Intravenous Q2H PRN Carollee Herter, DO   1 mg at 10/24/22 1410   iohexol (OMNIPAQUE) 350 MG/ML injection 75 mL  75 mL Intravenous Once PRN Carollee Herter, DO       LORazepam (ATIVAN) tablet 0.5 mg  0.5 mg Oral BID PRN Dow Adolph N, DO   0.5 mg at 10/23/22 2116   OLANZapine (ZYPREXA) tablet 2.5 mg  2.5 mg Oral BID Carollee Herter, DO   2.5 mg at 10/25/22 0939   ondansetron (ZOFRAN) tablet 4 mg  4 mg Oral Q6H PRN Carollee Herter, DO       Or   ondansetron [facility]) injection 4 mg  4 mg Intravenous Q6H PRN Carollee Herter, DO       Oral care mouth rinse  15 mL Mouth Rinse PRN Pokhrel, Laxman, MD       oxyCODONE (Oxy IR/ROXICODONE) immediate release tablet  5 mg  5 mg Oral Q4H PRN Carollee Herter, DO   5 mg at 10/25/22 0756   tamsulosin (FLOMAX) capsule 0.4 mg  0.4 mg Oral Daily Carollee Herter, DO   0.4 mg at 10/25/22 9147   traZODone (DESYREL) tablet 150 mg  150 mg Oral QHS PRN Carollee Herter, DO   150 mg at 10/23/22 2115     Discharge Medications: Please see discharge summary for a list of discharge medications.  Relevant Imaging Results:  Relevant Lab Results:   Additional Information SS# [id number], [place]

## 2022-10-26 DIAGNOSIS — N179 Acute kidney failure, unspecified: Secondary | ICD-10-CM | POA: Diagnosis not present

## 2022-10-26 NOTE — Progress Notes (Signed)
Physical Therapy Treatment Patient Details Name: Aaron Gibbs MRN: [id number] DOB: [birth year] Today's Date: 10/26/2022   History of Present Illness 88 year old male who presented after a fall, found to have C2 fx. PMHx: blindness, chronic pain on narcotics, COPD, depression, GERD, hypertension, recently discharged from [facility] hospital (admitted for fall, found to have COVID)    PT Comments    Pt continues to be significantly limited by pain, despite pre-medication prior to session, however pt able to progress mobility this session with focus on OOB mobility and functional transfers. Pt able to squat pivot EOB<>chair with max A +2 and come to stand with x2 HHA for support and mod A to power up and steady. Pt care-person Charli present, supportive and insightful throughout session. Current plan remains appropriate to address deficits and maximize functional independence and decrease caregiver burden. Pt continues to benefit from skilled PT services to progress toward functional mobility goals.    Recommendations for follow up therapy are one component of a multi-disciplinary discharge planning process, led by the attending physician.  Recommendations may be updated based on patient status, additional functional criteria and insurance authorization.  Follow Up Recommendations  Can patient physically be transported by private vehicle: No    Assistance Recommended at Discharge Frequent or constant Supervision/Assistance  Patient can return home with the following Assist for transportation;Help with stairs or ramp for entrance;Assistance with feeding;Two people to help with walking and/or transfers;Two people to help with bathing/dressing/bathroom;Assistance with cooking/housework;Direct supervision/assist for medications management;Direct supervision/assist for financial management   Equipment Recommendations  None recommended by PT    Recommendations for Other Services        Precautions / Restrictions Precautions Precautions: Fall;Cervical Precaution Comments: Blind Required Braces or Orthoses: Cervical Brace Cervical Brace: At all times;Soft collar Restrictions Weight Bearing Restrictions: No     Mobility  Bed Mobility Overal bed mobility: Needs Assistance Bed Mobility: Sidelying to Sit, Sit to Sidelying   Sidelying to sit: Min assist     Sit to sidelying: Min guard      Transfers Overall transfer level: Needs assistance Equipment used: 2 person hand held assist Transfers: Sit to/from Stand, Bed to chair/wheelchair/BSC Sit to Stand: +2 physical assistance, Mod assist     Squat pivot transfers: +2 physical assistance, Max assist     General transfer comment: transferred to chair for bed linen change, pt with poor tolerance of sitting, became restless asking to return to bed due to pain, pt able to stand to reposition bed pad with +2 mod assist    Ambulation/Gait                   Stairs             Wheelchair Mobility    Modified Rankin (Stroke Patients Only)       Balance Overall balance assessment: Needs assistance   Sitting balance-Leahy Scale: Fair     Standing balance support: Bilateral upper extremity supported Standing balance-Leahy Scale: Poor                              Cognition Arousal/Alertness: Awake/alert Behavior During Therapy: Restless, Flat affect Overall Cognitive Status: Impaired/Different from baseline Area of Impairment: Following commands, Safety/judgement, Problem solving, Memory, Attention                   Current Attention Level: Sustained Memory: Decreased short-term memory Following Commands: Follows one step commands with increased  time Safety/Judgement: Decreased awareness of safety, Decreased awareness of deficits   Problem Solving: Slow processing, Decreased initiation General Comments: premedication did not appear to help with activity tolerance         Exercises      General Comments General comments (skin integrity, edema, etc.): VSS on RA      Pertinent Vitals/Pain Pain Assessment Pain Assessment: 0-10 Pain Score: 8  Pain Location: neck Pain Descriptors / Indicators: Restless, Discomfort, Grimacing, Guarding Pain Intervention(s): Monitored during session, Limited activity within patient's tolerance, Premedicated before session    Home Living                          Prior Function            PT Goals (current goals can now be found in the care plan section) Acute Rehab PT Goals PT Goal Formulation: With patient Time For Goal Achievement: 11/02/22 Progress towards PT goals: Not progressing toward goals - comment (pain)    Frequency    Min 2X/week      PT Plan      Co-evaluation PT/OT/SLP Co-Evaluation/Treatment: Yes Reason for Co-Treatment: For patient/therapist safety;Complexity of the patient's impairments (multi-system involvement) PT goals addressed during session: Mobility/safety with mobility;Strengthening/ROM OT goals addressed during session: Strengthening/ROM      AM-PAC PT "6 Clicks" Mobility   Outcome Measure  Help needed turning from your back to your side while in a flat bed without using bedrails?: A Little Help needed moving from lying on your back to sitting on the side of a flat bed without using bedrails?: A Little Help needed moving to and from a bed to a chair (including a wheelchair)?: Total Help needed standing up from a chair using your arms (e.g., wheelchair or bedside chair)?: Total Help needed to walk in hospital room?: Total Help needed climbing 3-5 steps with a railing? : Total 6 Click Score: 10    End of Session Equipment Utilized During Treatment: Cervical collar Activity Tolerance: Patient limited by pain Patient left: in bed;with call bell/phone within reach;with bed alarm set Nurse Communication: Mobility status PT Visit Diagnosis: Other abnormalities of  gait and mobility (R26.89);Muscle weakness (generalized) (M62.81)     Time: 1610-9604 PT Time Calculation (min) (ACUTE ONLY): 22 min  Charges:  $Therapeutic Activity: 8-22 mins                     Oral Hallgren R. PTA Acute Rehabilitation Services Office: [phone]    Catalina Antigua 10/26/2022, 1:35 PM

## 2022-10-26 NOTE — Plan of Care (Signed)

## 2022-10-26 NOTE — Progress Notes (Signed)
PROGRESS NOTE    Aaron Gibbs  ZOX:[id number] DOB: 02-20-1935 DOA: 10/17/2022 PCP: Smitty Cords, DO   Brief Narrative: Aaron Gibbs is a 88 y.o. male with a history of blindness, chronic pain, COPD, depression, GERD, hypertension. Patient presented secondary to a fall and found to have a C2 vertebral fracture. Neurosurgery consulted and recommended no surgical management. Patient with associated AKI that responded well to IV fluids. Plan to discharge to SNF.  Assessment and Plan:  AKI Creatinine of 1.90 on admission. Resolved with IV fluids.  Hypokalemia Low potassium of 3.1. Resolved with supplementation.  C2 cervical fracture Secondary to fall. Minimally displaced fracture noted on CT. Neurosurgery consulted and recommended soft collar for stabilization x6 weeks. 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 Discussed with neurosurgery; no change to management -Continue soft collar  COVID-19 infection Previously diagnosed prior to admission on 4/24. Asymptomatic.  Lactic acidosis Present on admission. Improved with IV fluids.  Positive blood cultures Patient with blood cultures positive for staphylococcus epidermidis and staphylococcus hominis in separate sets of blood cultures.  Fall Unclear etiology. Polypharmacy is a possible etiology for falls.  Chronic HFpEF Stable. -Continue Coreg and Bumex  COPD Stable.  Primary hypertension -Continue Coreg  Chronic pain -Continue buprenorphine patch  Infrarenal abdominal aortic aneurysm Chronic. Outpatient vascular surgery follow-up  FOBT positive Noted on admission. No overt melena or hematochezia noted. Hemoglobin stable. Can follow-up outpatient.  Depression -Continue Cymbalta  Metastatic melanoma Recently diagnosed. Consideration for surgical excision while inpatient per oncology recommendations. General surgery consulted and recommended to defer surgical  excision to outpatient.    DVT prophylaxis: SCDs Code Status:   Code Status: DNR Family Communication: Caregiver at bedside. Disposition Plan: Discharge to SNF when bed is available   Consultants:  Neurosurgery Medical oncology Palliative care medicine General surgery  Procedures:  None  Antimicrobials: Vancomycin Cefepime Flagyl    Subjective: Neck pain. No other concerns  Objective: BP 135/70 (BP Location: Left Arm)   Pulse 71   Temp (!) 97.4 F (36.3 C) (Oral)   Resp 17   Ht 5\' 10"  (1.778 m)   Wt 98.8 kg   SpO2 96%   BMI 31.25 kg/m   Examination:  General exam: Appears calm and comfortable Respiratory system: Clear to auscultation. Respiratory effort normal. Cardiovascular system: S1 & S2 heard, RRR. Gastrointestinal system: Abdomen is nondistended, soft and nontender. No organomegaly or masses felt. Normal bowel sounds heard. Central nervous system: Alert and oriented. Musculoskeletal: No edema. No calf tenderness Skin: No cyanosis. Multiple areas of ecchymosis noted especially on arm   Data Reviewed: I have personally reviewed following labs and imaging studies  CBC Lab Results  Component Value Date   WBC 3.9 (L) 10/23/2022   RBC 3.35 (L) 10/23/2022   HGB 10.1 (L) 10/23/2022   HCT 31.5 (L) 10/23/2022   MCV 94.0 10/23/2022   MCH 30.1 10/23/2022   PLT 150 10/23/2022   MCHC 32.1 10/23/2022   RDW 17.9 (H) 10/23/2022   LYMPHSABS 0.9 10/18/2022   MONOABS 1.3 (H) 10/18/2022   EOSABS 0.1 10/18/2022   BASOSABS 0.0 10/18/2022     Last metabolic panel Lab Results  Component Value Date   NA 141 10/23/2022   K 3.8 10/23/2022   CL 112 (H) 10/23/2022   CO2 23 10/23/2022   BUN 13 10/23/2022   CREATININE 0.81 10/23/2022   GLUCOSE 119 (H) 10/23/2022   GFRNONAA >60 10/23/2022   GFRAA >60 02/09/2020  CALCIUM 8.7 (L) 10/23/2022   PHOS 3.4 03/30/2019   PROT 5.1 (L) 10/18/2022   ALBUMIN 2.6 (L) 10/18/2022   LABGLOB 2.7 06/09/2022   AGRATIO 2.1  07/10/2018   BILITOT 1.3 (H) 10/18/2022   ALKPHOS 41 10/18/2022   AST 24 10/18/2022   ALT 26 10/18/2022   ANIONGAP 6 10/23/2022    GFR: Estimated Creatinine Clearance: 78.6 mL/min (by C-G formula based on SCr of 0.81 mg/dL).  Recent Results (from the past 240 hour(s))  Resp panel by RT-PCR (RSV, Flu A&B, Covid) Anterior Nasal Swab     Status: Abnormal   Collection Time: 10/17/22  2:27 AM   Specimen: Anterior Nasal Swab  Result Value Ref Range Status   SARS Coronavirus 2 by RT PCR POSITIVE (A) NEGATIVE Final   Influenza A by PCR NEGATIVE NEGATIVE Final   Influenza B by PCR NEGATIVE NEGATIVE Final    Comment: (NOTE) The Xpert Xpress SARS-CoV-2/FLU/RSV plus assay is intended as an aid in the diagnosis of influenza from Nasopharyngeal swab specimens and should not be used as a sole basis for treatment. Nasal washings and aspirates are unacceptable for Xpert Xpress SARS-CoV-2/FLU/RSV testing.  Fact Sheet for Patients: BloggerCourse.com  Fact Sheet for Healthcare Providers: SeriousBroker.it  This test is not yet approved or cleared by the [place] FDA and has been authorized for detection and/or diagnosis of SARS-CoV-2 by FDA under an Emergency Use Authorization (EUA). This EUA will remain in effect (meaning this test can be used) for the duration of the COVID-19 declaration under Section 564(b)(1) of the Act, 21 U.S.C. section 360bbb-3(b)(1), unless the authorization is terminated or revoked.     Resp Syncytial Virus by PCR NEGATIVE NEGATIVE Final    Comment: (NOTE) Fact Sheet for Patients: BloggerCourse.com  Fact Sheet for Healthcare Providers: SeriousBroker.it  This test is not yet approved or cleared by the [place] FDA and has been authorized for detection and/or diagnosis of SARS-CoV-2 by FDA under an Emergency Use Authorization (EUA). This EUA will remain in  effect (meaning this test can be used) for the duration of the COVID-19 declaration under Section 564(b)(1) of the Act, 21 U.S.C. section 360bbb-3(b)(1), unless the authorization is terminated or revoked.  Performed at [facility], [address], [place], [place] [address]   Blood Culture (routine x 2)     Status: Abnormal   Collection Time: 10/17/22  2:33 AM   Specimen: BLOOD RIGHT ARM  Result Value Ref Range Status   Specimen Description BLOOD RIGHT ARM  Final   Special Requests   Final    BOTTLES DRAWN AEROBIC AND ANAEROBIC Blood Culture adequate volume   Culture  Setup Time   Final    GRAM POSITIVE COCCI IN CLUSTERS IN BOTH AEROBIC AND ANAEROBIC BOTTLES CRITICAL RESULT CALLED TO, READ BACK BY AND VERIFIED WITH: PHARMD T. DANG [id number] @2046  FH    Culture (A)  Final    STAPHYLOCOCCUS HOMINIS THE SIGNIFICANCE OF ISOLATING THIS ORGANISM FROM A SINGLE SET OF BLOOD CULTURES WHEN MULTIPLE SETS ARE DRAWN IS UNCERTAIN. PLEASE NOTIFY THE MICROBIOLOGY DEPARTMENT WITHIN ONE WEEK IF SPECIATION AND SENSITIVITIES ARE REQUIRED. Performed at [facility], [address] [place], [place] [address]    Report Status 10/19/2022 FINAL  Final  Blood Culture (routine x 2)     Status: Abnormal   Collection Time: 10/17/22  2:33 AM   Specimen: BLOOD LEFT ARM  Result Value Ref Range Status   Specimen Description BLOOD LEFT ARM  Final   Special Requests   Final    BOTTLES DRAWN AEROBIC AND ANAEROBIC Blood Culture adequate volume   Culture  Setup Time   Final    GRAM POSITIVE COCCI ANAEROBIC BOTTLE ONLY CRITICAL VALUE NOTED.  VALUE IS CONSISTENT WITH PREVIOUSLY REPORTED AND CALLED VALUE.    Culture (A)  Final    STAPHYLOCOCCUS EPIDERMIDIS THE SIGNIFICANCE OF ISOLATING THIS ORGANISM FROM A SINGLE SET OF BLOOD CULTURES WHEN MULTIPLE SETS ARE DRAWN IS UNCERTAIN. PLEASE NOTIFY THE MICROBIOLOGY DEPARTMENT WITHIN ONE WEEK IF SPECIATION AND SENSITIVITIES ARE REQUIRED. Performed at [facility], [address], [place], [place] [address]    Report Status 10/19/2022 FINAL  Final  Blood Culture ID Panel (Reflexed)     Status: Abnormal   Collection Time: 10/17/22  2:33 AM  Result Value Ref Range Status   Enterococcus faecalis NOT DETECTED NOT DETECTED Final   Enterococcus Faecium NOT DETECTED NOT DETECTED Final   Listeria monocytogenes NOT DETECTED NOT DETECTED Final   Staphylococcus species DETECTED (A) NOT DETECTED Final    Comment: CRITICAL RESULT CALLED TO, READ BACK BY AND VERIFIED WITH: PHARMD T. DANG [id number] @2046  FH    Staphylococcus aureus (BCID) NOT DETECTED NOT DETECTED Final   Staphylococcus epidermidis NOT DETECTED NOT DETECTED Final   Staphylococcus lugdunensis NOT DETECTED NOT DETECTED Final   Streptococcus species NOT DETECTED NOT DETECTED Final   Streptococcus agalactiae NOT DETECTED NOT DETECTED Final   Streptococcus pneumoniae NOT DETECTED NOT DETECTED Final   Streptococcus pyogenes NOT DETECTED NOT DETECTED Final   A.calcoaceticus-baumannii NOT DETECTED NOT DETECTED Final   Bacteroides fragilis NOT DETECTED NOT DETECTED Final   Enterobacterales NOT DETECTED NOT DETECTED Final   Enterobacter cloacae complex NOT DETECTED NOT DETECTED Final   Escherichia coli NOT DETECTED NOT DETECTED Final   Klebsiella aerogenes NOT DETECTED NOT DETECTED Final   Klebsiella oxytoca NOT DETECTED NOT DETECTED Final   Klebsiella pneumoniae NOT DETECTED NOT DETECTED Final   Proteus species NOT DETECTED NOT DETECTED Final   Salmonella species NOT DETECTED NOT DETECTED Final   Serratia marcescens NOT DETECTED NOT DETECTED Final   Haemophilus influenzae NOT DETECTED NOT DETECTED Final   Neisseria meningitidis NOT DETECTED NOT DETECTED Final   Pseudomonas aeruginosa NOT DETECTED NOT DETECTED Final   Stenotrophomonas maltophilia NOT DETECTED NOT DETECTED Final   Candida albicans NOT DETECTED NOT DETECTED Final   Candida auris NOT DETECTED NOT DETECTED Final   Candida  glabrata NOT DETECTED NOT DETECTED Final   Candida krusei NOT DETECTED NOT DETECTED Final   Candida parapsilosis NOT DETECTED NOT DETECTED Final   Candida tropicalis NOT DETECTED NOT DETECTED Final   Cryptococcus neoformans/gattii NOT DETECTED NOT DETECTED Final    Comment: Performed at [facility], [address], [place], [place] [address]      Radiology Studies: CT ANGIO NECK W OR WO CONTRAST  Result Date: 10/25/2022 CLINICAL DATA:  Cervical spine fracture with possible vertebral artery injury. EXAM: CT ANGIOGRAPHY NECK TECHNIQUE: Multidetector CT imaging of the neck was performed using the standard protocol during bolus administration of intravenous contrast. Multiplanar CT image reconstructions and MIPs were obtained to evaluate the vascular anatomy. Carotid stenosis measurements (when applicable) are obtained utilizing NASCET criteria, using the distal internal carotid diameter as the denominator. RADIATION DOSE REDUCTION: This exam was performed according to the departmental dose-optimization program which includes automated exposure control, adjustment of the mA and/or kV according to patient size and/or use of iterative reconstruction technique.  CONTRAST:  75mL OMNIPAQUE IOHEXOL 350 MG/ML SOLN COMPARISON:  CT cervical spine 10/17/2022 FINDINGS: Aortic arch: 3 vessel branch pattern. Mild calcific atherosclerosis. No aneurysm or dissection. Right carotid system: Atherosclerotic calcification at the bifurcation with less than 50% stenosis. Left carotid system: Mixed density atherosclerosis within the proximal ICA without hemodynamically significant stenosis. Vertebral arteries: Left dominant system. The left vertebral artery is normal the along its entire course. The right vertebral artery V2 segment is markedly narrowed beginning at the C3 level and occluded at C2. There is reconstitution of the V4 segment via collateral flow. Limited intracranial: No proximal occlusion or stenosis of the  circle-of-Willis vessels. Skeleton: Known right lateral mass fracture of C2. Cervicothoracic ankylosis. Other neck: Negative Upper chest: Clear IMPRESSION: 1. 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 Biffl grade 4 blunt cerebrovascular injury. 2. Known right lateral mass fracture of C2. 3. Bilateral carotid bifurcation atherosclerosis without hemodynamically significant stenosis. Electronically Signed   By: Deatra Robinson M.D.   On: 10/25/2022 19:21      LOS: 8 days    Jacquelin Hawking, MD [facility] 10/26/2022, 2:16 PM   If 7PM-7AM, please contact night-coverage [email or web address]

## 2022-10-26 NOTE — Progress Notes (Signed)
CT angio demonstrates vertebral artery occlusion on the right.  No indication for operative intervention.  Continue soft collar.  Would add low-dose aspirin therapy if patient can tolerate from a medical standpoint.  ( 81 mg per day )

## 2022-10-26 NOTE — TOC Progression Note (Signed)
Transition of Care [facility]) - Initial/Assessment Note    Patient Details  Name: Aaron Gibbs MRN: [id number] Date of Birth: [birth year]  [facility]) CM/SW Contact:    Aaron Gibbs, LCSWA Phone Number: 10/26/2022, 2:16 PM  Clinical Narrative:                 LCSW spoke with the patient's daughter, Aaron Gibbs, and informed that [facility] (SNF) is unable to accept the patient.  The daughter has a preference for [facility] or [facility].  The daughter is open to LCSW sending the referral to other facilities in both [place] and [place] counties.    TOC following.  Expected Discharge Plan: Skilled Nursing Facility Barriers to Discharge: Continued Medical Work up, English as a second language teacher, SNF Pending bed offer   Patient Goals and CMS Choice   CMS Medicare.gov Compare Post Acute Care list provided to:: Patient Choice offered to / list presented to : Patient, Adult Children      Expected Discharge Plan and Services In-house Referral: Clinical Social Work     Living arrangements for the past 2 months: Single Family Home                                      Prior Living Arrangements/Services Living arrangements for the past 2 months: Single Family Home Lives with:: Other (Comment) (caregiver) Patient language and need for interpreter reviewed:: Yes Do you feel safe going back to the place where you live?: Yes      Need for Family Participation in Patient Care: Yes (Comment) Care giver support system in place?: Yes (comment)   Criminal Activity/Legal Involvement Pertinent to Current Situation/Hospitalization: No - Comment as needed  Activities of Daily Living      Permission Sought/Granted   Permission granted to share information with : Yes, Verbal Permission Granted     Permission granted to share info w AGENCY: SNFs        Emotional Assessment Appearance:: Appears stated age Attitude/Demeanor/Rapport: Engaged   Orientation: : Oriented to  Place, Oriented to Self Alcohol / Substance Use: Not Applicable Psych Involvement: No (comment)  Admission diagnosis:  AKI (acute kidney injury) (HCC) [N17.9] Closed nondisplaced fracture of second cervical vertebra, unspecified fracture morphology, initial encounter (HCC) [S12.101A] COVID-19 [U07.1] C2 cervical fracture (HCC) [S12.100A] Patient Active Problem List   Diagnosis Date Noted   DNR (do not resuscitate) 10/17/2022   AKI (acute kidney injury) (HCC) 10/17/2022   C2 cervical fracture (HCC) 10/17/2022   Infrarenal abdominal aortic aneurysm (AAA) without rupture (HCC) 10/17/2022   Hypokalemia 10/17/2022   COVID-19 10/11/2022   Thrombocytopenia (HCC) 10/11/2022   Chronic heart failure with preserved ejection fraction (HCC) 09/13/2022   Palliative care patient 07/17/2022   Other pancytopenia (HCC) 06/09/2022   Unable to maintain body in lying position 05/24/2022   Benign paroxysmal positional vertigo    Weakness 01/29/2022   Sensorineural hearing loss (SNHL) of both ears 07/25/2021   Pain in right knee 03/15/2021   Other chronic pain 03/15/2021   Chronic knee pain (Left) 09/28/2020   Abnormal MRI, knee (08/05/2018) (Left) 09/13/2020   Long-term current use of benzodiazepine 08/09/2020   Opioid dependence, binge pattern (HCC) 08/09/2020   Opioid dependence with or without physiological dependence (HCC) 08/08/2020   History of substance use disorder 04/15/2020   Osteoarthritis of knee (Left) 03/23/2020   Aortic atherosclerosis (HCC) 02/05/2020   Generalized anxiety disorder  with panic attacks 02/04/2020   Depression with anxiety 12/13/2019   Depression 12/12/2019   COPD (chronic obstructive pulmonary disease) (HCC)    HLD (hyperlipidemia)    Iron deficiency anemia    Fall at home, initial encounter    Medial meniscal tear, sequela (Left) 06/10/2019   Patellar tendinosis (Right) 06/10/2019   Lateral meniscal tear, sequela (Left) 03/03/2019   Palpitation 02/13/2019    Preop testing 11/14/2018   Noncompliance with medication treatment due to overuse of medication 10/23/2018   Neurogenic pain 08/20/2018   Atherosclerotic peripheral vascular disease (HCC) 07/24/2018   Tricompartment osteoarthritis of knee (Left) 07/24/2018   Osteoarthritis of knee (Bilateral) 07/24/2018   Osteoarthritis of patellofemoral joint (Right) 07/24/2018   History of suicide attempt (06/12/18) 07/24/2018   Long term current use of opiate analgesic 07/10/2018   Pharmacologic therapy 07/10/2018   Disorder of skeletal system 07/10/2018   Problems influencing health status 07/10/2018   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   Major depressive disorder, recurrent episode, severe (HCC) 06/04/2018   Blindness 05/10/2018   Chronic pain syndrome 05/01/2018   DISH (diffuse idiopathic skeletal hyperostosis) 05/01/2018   Osteoarthritis of multiple joints 05/01/2018   Chronic knee pain (1ry area of Pain) (Bilateral) (L>R) 05/01/2018   Retinitis pigmentosa of both eyes 05/01/2018   Glaucoma of both eyes 05/01/2018   Essential hypertension 05/01/2018   Chronic low back pain (Bilateral) w/o sciatica 05/01/2018   GERD (gastroesophageal reflux disease) 05/01/2018   AVM (arteriovenous malformation) of colon 05/01/2018   Therapeutic opioid-induced constipation (OIC) 05/01/2018   PCP:  Smitty Cords, DO Pharmacy:   [facility] [address], [place] - [address] [place] [address] Phone: [phone] Fax: [phone]     Social Determinants of Health (SDOH) Social History: SDOH Screenings   Food Insecurity: No Food Insecurity (10/11/2022)  Housing: Low Risk  (10/11/2022)  Transportation Needs: No Transportation Needs (10/11/2022)  Utilities: Not At Risk (10/11/2022)  Alcohol Screen: Low Risk  (07/13/2022)  Depression (PHQ2-9): Low Risk  (09/28/2022)  Recent Concern: Depression (PHQ2-9) - High Risk (09/13/2022)  Financial Resource Strain: Low Risk   (07/13/2022)  Physical Activity: Inactive (07/13/2022)  Social Connections: Socially Isolated (07/13/2022)  Stress: Stress Concern Present (07/13/2022)  Tobacco Use: High Risk (10/24/2022)   SDOH Interventions:     Readmission Risk Interventions     No data to display

## 2022-10-26 NOTE — Progress Notes (Signed)
Occupational Therapy Treatment Patient Details Name: Aaron Gibbs MRN: [id number] DOB: 05-13-1935 Today's Date: 10/26/2022   History of present illness 88 year old male who presented after a fall, found to have C2 fx. PMHx: blindness, chronic pain on narcotics, COPD, depression, GERD, hypertension, recently discharged from [facility] hospital (admitted for fall, found to have COVID)   OT comments  Pt continues to demonstrate poor activity tolerance despite being premedicated for pain. Pt reports 8/10 neck pain with mobility. Performed squat pivot x 2 with +2 max assist. Stood from EOB with +2 mod assist. Pt's caregiver, Billey Gosling, participating in session and encouraging pt. Pt will need a hospital bed and hoyer lift if he returns home with caregiver.    Recommendations for follow up therapy are one component of a multi-disciplinary discharge planning process, led by the attending physician.  Recommendations may be updated based on patient status, additional functional criteria and insurance authorization.    Assistance Recommended at Discharge Frequent or constant Supervision/Assistance  Patient can return home with the following  Two people to help with walking and/or transfers;Two people to help with bathing/dressing/bathroom;Assistance with cooking/housework;Assistance with feeding;Direct supervision/assist for medications management;Assist for transportation;Help with stairs or ramp for entrance   Equipment Recommendations  Hospital bed (hoyer lift)    Recommendations for Other Services      Precautions / Restrictions Precautions Precautions: Fall;Cervical Precaution Comments: Blind Required Braces or Orthoses: Cervical Brace Cervical Brace: At all times;Soft collar Restrictions Weight Bearing Restrictions: No       Mobility Bed Mobility Overal bed mobility: Needs Assistance Bed Mobility: Sidelying to Sit, Sit to Sidelying   Sidelying to sit: Min assist     Sit to  sidelying: Min guard      Transfers Overall transfer level: Needs assistance Equipment used: 2 person hand held assist Transfers: Sit to/from Stand, Bed to chair/wheelchair/BSC Sit to Stand: +2 physical assistance, Mod assist   Squat pivot transfers: +2 physical assistance, Max assist       General transfer comment: transferred to chair for bed linen change, pt with poor tolerance of sitting, became restless asking to return to bed due to pain, pt able to stand to reposition bed pad with +2 mod assist     Balance Overall balance assessment: Needs assistance   Sitting balance-Leahy Scale: Fair     Standing balance support: Bilateral upper extremity supported Standing balance-Leahy Scale: Poor                             ADL either performed or assessed with clinical judgement   ADL                                              Extremity/Trunk Assessment              Vision       Perception     Praxis      Cognition Arousal/Alertness: Awake/alert Behavior During Therapy: Restless, Flat affect Overall Cognitive Status: Impaired/Different from baseline Area of Impairment: Following commands, Safety/judgement, Problem solving, Memory, Attention                   Current Attention Level: Sustained Memory: Decreased short-term memory Following Commands: Follows one step commands with increased time Safety/Judgement: Decreased awareness of safety, Decreased awareness of deficits   Problem Solving:  Slow processing, Decreased initiation General Comments: premedication did not appear to help with activity tolerance        Exercises      Shoulder Instructions       General Comments      Pertinent Vitals/ Pain       Pain Assessment Pain Assessment: 0-10 Pain Score: 8  Pain Location: neck Pain Descriptors / Indicators: Restless, Discomfort, Grimacing, Guarding Pain Intervention(s): Premedicated before session,  Repositioned  Home Living                                          Prior Functioning/Environment              Frequency  Min 2X/week        Progress Toward Goals  OT Goals(current goals can now be found in the care plan section)  Progress towards OT goals: Not progressing toward goals - comment  Acute Rehab OT Goals OT Goal Formulation: With patient Time For Goal Achievement: 10/31/22 Potential to Achieve Goals: Fair  Plan Discharge plan remains appropriate    Co-evaluation    PT/OT/SLP Co-Evaluation/Treatment: Yes Reason for Co-Treatment: For patient/therapist safety;Complexity of the patient's impairments (multi-system involvement)   OT goals addressed during session: Strengthening/ROM      AM-PAC OT "6 Clicks" Daily Activity     Outcome Measure   Help from another person eating meals?: A Lot Help from another person taking care of personal grooming?: Total Help from another person toileting, which includes using toliet, bedpan, or urinal?: Total Help from another person bathing (including washing, rinsing, drying)?: Total Help from another person to put on and taking off regular upper body clothing?: Total Help from another person to put on and taking off regular lower body clothing?: Total 6 Click Score: 7    End of Session Equipment Utilized During Treatment: Gait belt  OT Visit Diagnosis: Unsteadiness on feet (R26.81);Muscle weakness (generalized) (M62.81);Pain   Activity Tolerance Patient limited by pain   Patient Left in bed;with call bell/phone within reach;with bed alarm set;with family/visitor present   Nurse Communication Patient requests pain meds;Mobility status        Time: 2130-8657 OT Time Calculation (min): 21 min  Charges: OT General Charges $OT Visit: 1 Visit Berna Spare, OTR/L Acute Rehabilitation Services Office: [phone]   Evern Bio 10/26/2022, 12:03 PM

## 2022-10-26 NOTE — Progress Notes (Signed)
Pt refused bath for second time today.  His bed linens have been changed.

## 2022-10-27 DIAGNOSIS — N179 Acute kidney failure, unspecified: Secondary | ICD-10-CM | POA: Diagnosis not present

## 2022-10-27 LAB — GLUCOSE, CAPILLARY: Glucose-Capillary: 143 mg/dL — ABNORMAL HIGH (ref 70–99)

## 2022-10-27 MED ORDER — ASPIRIN 81 MG PO TBEC
81.0000 mg | DELAYED_RELEASE_TABLET | Freq: Every day | ORAL | Status: DC
  Administered 2022-10-27: 81 mg via ORAL
  Filled 2022-10-27: qty 1

## 2022-10-27 NOTE — TOC Progression Note (Addendum)
Transition of Care [facility]) - Progression Note    Patient Details  Name: Aaron Gibbs MRN: [id number] Date of Birth: 1934-08-10  [facility]) CM/SW Contact  Delilah Shan, LCSWA Phone Number: 10/27/2022, 3:31 PM  Clinical Narrative:     [name] patients daughter wanted CSW to ask facility if able to accept patient , and if it was okay for a caregiver to stay with patient at night. [name] also is looking for LTC for patient after rehab. CSW informed Tonya with [facility]. Tonya informed CSW will review and give CSW a call back. CSW will continue to follow.  Expected Discharge Plan: Skilled Nursing Facility Barriers to Discharge: Continued Medical Work up, English as a second language teacher, SNF Pending bed offer  Expected Discharge Plan and Services In-house Referral: Clinical Social Work     Living arrangements for the past 2 months: Single Family Home                                       Social Determinants of Health (SDOH) Interventions SDOH Screenings   Food Insecurity: No Food Insecurity (10/11/2022)  Housing: Low Risk  (10/11/2022)  Transportation Needs: No Transportation Needs (10/11/2022)  Utilities: Not At Risk (10/11/2022)  Alcohol Screen: Low Risk  (07/13/2022)  Depression (PHQ2-9): Low Risk  (09/28/2022)  Recent Concern: Depression (PHQ2-9) - High Risk (09/13/2022)  Financial Resource Strain: Low Risk  (07/13/2022)  Physical Activity: Inactive (07/13/2022)  Social Connections: Socially Isolated (07/13/2022)  Stress: Stress Concern Present (07/13/2022)  Tobacco Use: High Risk (10/24/2022)    Readmission Risk Interventions     No data to display

## 2022-10-27 NOTE — TOC Progression Note (Addendum)
Transition of Care [facility]) - Progression Note    Patient Details  Name: Aaron Gibbs MRN: [id number] Date of Birth: 1935/03/20  Transition of Care [facility]) CM/SW Contact  Delilah Shan, LCSWA Phone Number: 10/27/2022, 12:07 PM  Clinical Narrative:      Update- CSW spoke with patients daughter [name] and provided SNF bed offers. [name] informed CSW she is going to review and give CSW a call back with SNF choice.    CSW called Tiffany at [facility] and [place] with [facility] who are currently reviewing referral to see if they can offer SNF bed for patient. CSW will continue to follow.  Expected Discharge Plan: Skilled Nursing Facility Barriers to Discharge: Continued Medical Work up, English as a second language teacher, SNF Pending bed offer  Expected Discharge Plan and Services In-house Referral: Clinical Social Work     Living arrangements for the past 2 months: Single Family Home                                       Social Determinants of Health (SDOH) Interventions SDOH Screenings   Food Insecurity: No Food Insecurity (10/11/2022)  Housing: Low Risk  (10/11/2022)  Transportation Needs: No Transportation Needs (10/11/2022)  Utilities: Not At Risk (10/11/2022)  Alcohol Screen: Low Risk  (07/13/2022)  Depression (PHQ2-9): Low Risk  (09/28/2022)  Recent Concern: Depression (PHQ2-9) - High Risk (09/13/2022)  Financial Resource Strain: Low Risk  (07/13/2022)  Physical Activity: Inactive (07/13/2022)  Social Connections: Socially Isolated (07/13/2022)  Stress: Stress Concern Present (07/13/2022)  Tobacco Use: High Risk (10/24/2022)    Readmission Risk Interventions     No data to display

## 2022-10-27 NOTE — Progress Notes (Signed)
PROGRESS NOTE    Aaron Gibbs  ZOX:[id number] DOB: 08/18/1934 DOA: 10/17/2022 PCP: Smitty Cords, DO   Brief Narrative: Aaron Gibbs is a 88 y.o. male with a history of blindness, chronic pain, COPD, depression, GERD, hypertension. Patient presented secondary to a fall and found to have a C2 vertebral fracture. Neurosurgery consulted and recommended no surgical management. Patient with associated AKI that responded well to IV fluids. Plan to discharge to SNF.  Assessment and Plan:  AKI Creatinine of 1.90 on admission. Resolved with IV fluids.  Hypokalemia Low potassium of 3.1. Resolved with supplementation.  C2 cervical fracture Secondary to fall. Minimally displaced fracture noted on CT. Neurosurgery consulted and recommended soft collar for stabilization x6 weeks. 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 Discussed with neurosurgery; no change to management but recommended aspirin if able. Patient with FOBT positive stool, so will defer aspirin until GI evaluation. -Continue soft collar  COVID-19 infection Previously diagnosed prior to admission on 4/24. Asymptomatic.  Lactic acidosis Present on admission. Improved with IV fluids.  Positive blood cultures Patient with blood cultures positive for staphylococcus epidermidis and staphylococcus hominis in separate sets of blood cultures.  Fall Unclear etiology. Polypharmacy is a possible etiology for falls.  Chronic HFpEF Stable. -Continue Coreg and Bumex  COPD Stable.  Primary hypertension -Continue Coreg  Chronic pain -Continue buprenorphine patch  Infrarenal abdominal aortic aneurysm Chronic. Outpatient vascular surgery follow-up  FOBT positive Noted on admission. No overt melena or hematochezia noted. Hemoglobin stable. Can follow-up outpatient.  Depression -Continue Cymbalta  Metastatic melanoma Recently diagnosed. Consideration for surgical excision  while inpatient per oncology recommendations. General surgery consulted and recommended to defer surgical excision to outpatient.    DVT prophylaxis: SCDs Code Status:   Code Status: DNR Family Communication: None at bedside. Disposition Plan: Discharge to SNF when bed is available   Consultants:  Neurosurgery Medical oncology Palliative care medicine General surgery  Procedures:  None  Antimicrobials: Vancomycin Cefepime Flagyl    Subjective: Continued neck pain. No other concerns.  Objective: BP (!) 190/81 (BP Location: Right Arm)   Pulse 79   Temp 98.4 F (36.9 C) (Oral)   Resp 18   Ht 5\' 10"  (1.778 m)   Wt 97.9 kg   SpO2 95%   BMI 30.97 kg/m   Examination:  General exam: Appears calm and comfortable Respiratory system: Clear to auscultation. Respiratory effort normal. Cardiovascular system: S1 & S2 heard, RRR. Gastrointestinal system: Abdomen is nondistended, soft and nontender. No organomegaly or masses felt. Normal bowel sounds heard. Central nervous system: Alert    Data Reviewed: I have personally reviewed following labs and imaging studies  CBC Lab Results  Component Value Date   WBC 3.9 (L) 10/23/2022   RBC 3.35 (L) 10/23/2022   HGB 10.1 (L) 10/23/2022   HCT 31.5 (L) 10/23/2022   MCV 94.0 10/23/2022   MCH 30.1 10/23/2022   PLT 150 10/23/2022   MCHC 32.1 10/23/2022   RDW 17.9 (H) 10/23/2022   LYMPHSABS 0.9 10/18/2022   MONOABS 1.3 (H) 10/18/2022   EOSABS 0.1 10/18/2022   BASOSABS 0.0 10/18/2022     Last metabolic panel Lab Results  Component Value Date   NA 141 10/23/2022   K 3.8 10/23/2022   CL 112 (H) 10/23/2022   CO2 23 10/23/2022   BUN 13 10/23/2022   CREATININE 0.81 10/23/2022   GLUCOSE 119 (H) 10/23/2022   GFRNONAA >60 10/23/2022   GFRAA >60 02/09/2020  CALCIUM 8.7 (L) 10/23/2022   PHOS 3.4 03/30/2019   PROT 5.1 (L) 10/18/2022   ALBUMIN 2.6 (L) 10/18/2022   LABGLOB 2.7 06/09/2022   AGRATIO 2.1 07/10/2018   BILITOT  1.3 (H) 10/18/2022   ALKPHOS 41 10/18/2022   AST 24 10/18/2022   ALT 26 10/18/2022   ANIONGAP 6 10/23/2022    GFR: Estimated Creatinine Clearance: 78.3 mL/min (by C-G formula based on SCr of 0.81 mg/dL).  No results found for this or any previous visit (from the past 240 hour(s)).     Radiology Studies: CT ANGIO NECK W OR WO CONTRAST  Result Date: 10/25/2022 CLINICAL DATA:  Cervical spine fracture with possible vertebral artery injury. EXAM: CT ANGIOGRAPHY NECK TECHNIQUE: Multidetector CT imaging of the neck was performed using the standard protocol during bolus administration of intravenous contrast. Multiplanar CT image reconstructions and MIPs were obtained to evaluate the vascular anatomy. Carotid stenosis measurements (when applicable) are obtained utilizing NASCET criteria, using the distal internal carotid diameter as the denominator. RADIATION DOSE REDUCTION: This exam was performed according to the departmental dose-optimization program which includes automated exposure control, adjustment of the mA and/or kV according to patient size and/or use of iterative reconstruction technique. CONTRAST:  75mL OMNIPAQUE IOHEXOL 350 MG/ML SOLN COMPARISON:  CT cervical spine 10/17/2022 FINDINGS: Aortic arch: 3 vessel branch pattern. Mild calcific atherosclerosis. No aneurysm or dissection. Right carotid system: Atherosclerotic calcification at the bifurcation with less than 50% stenosis. Left carotid system: Mixed density atherosclerosis within the proximal ICA without hemodynamically significant stenosis. Vertebral arteries: Left dominant system. The left vertebral artery is normal the along its entire course. The right vertebral artery V2 segment is markedly narrowed beginning at the C3 level and occluded at C2. There is reconstitution of the V4 segment via collateral flow. Limited intracranial: No proximal occlusion or stenosis of the circle-of-Willis vessels. Skeleton: Known right lateral mass  fracture of C2. Cervicothoracic ankylosis. Other neck: Negative Upper chest: Clear IMPRESSION: 1. 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 Biffl grade 4 blunt cerebrovascular injury. 2. Known right lateral mass fracture of C2. 3. Bilateral carotid bifurcation atherosclerosis without hemodynamically significant stenosis. Electronically Signed   By: Deatra Robinson M.D.   On: 10/25/2022 19:21      LOS: 9 days    Jacquelin Hawking, MD [facility] 10/27/2022, 12:52 PM   If 7PM-7AM, please contact night-coverage [email or web address]

## 2022-10-28 DIAGNOSIS — N179 Acute kidney failure, unspecified: Secondary | ICD-10-CM | POA: Diagnosis not present

## 2022-10-28 NOTE — Plan of Care (Signed)

## 2022-10-28 NOTE — Progress Notes (Signed)
Palliative Medicine Inpatient Follow Up Note HPI:  88 year old male with past medical history of blindness, chronic pain on narcotics, COPD, depression, GERD, hypertension who was recently discharged from [facility] yesterday presented to our hospital after sustaining a fall in the middle of the night.  Patient was brought into the hospital by EMS.  He has had 2 falls at home since discharge.   Palliative care has been asked to get involved to help establish goals of care in the setting of chronic comorbid conditions and overall declining health.  Today's Discussion 10/28/2022  *Please note that this is a verbal dictation therefore any spelling or grammatical errors are due to the "Dragon Medical One" system interpretation.  Chart reviewed inclusive of vital signs, progress notes, laboratory results, and diagnostic images. Egon is drinking supplemental shakes though eating poorly otherwise.   I met with Chrissie Noa at bedside this morning. He continues to share that he is experiencing discomfort intermittently. He shares that movements are limited as a result of this. He is aware of the plan for rehabilitation.   I spoke with patients daughter, [name] over the phone. We reviewed that she would like Gus to go to [employer]. She expresses that CG would be able to spend the night there.   We reviewed OP Palliative support following him along. We reviewed his very poor oral intake and the concern in relation to adult failure to thrive. We reviewed end of life and hospice care as an option if he should continue to be in this state and neglects to improve. [name] plans to call me once she arrives to discuss and complete a MOST form.   Questions and concerns addressed/Palliative Support Provided.   Objective Assessment: Vital Signs Vitals:   10/28/22 0747 10/28/22 0823  BP: 121/78 (!) 147/62  Pulse: 87 91  Resp: 18 16  Temp: 98.5 F (36.9 C) 98.5 F (36.9 C)   SpO2: 92% 91%    Intake/Output Summary (Last 24 hours) at 10/28/2022 1409 Last data filed at 10/28/2022 0800 Gross per 24 hour  Intake 1438 ml  Output 100 ml  Net 1338 ml    Last Weight  Most recent update: 10/27/2022  9:43 PM    Weight  97.9 kg (215 lb 13.3 oz)            Gen: Elderly chronically ill-appearing Caucasian male in no acute distress HEENT: Dry mucous membranes CV: Regular rate and rhythm PULM: On room air breathing is even and unlabored ABD: soft/nontender EXT: RLE edema Neuro: Alert and oriented x2  SUMMARY OF RECOMMENDATIONS   DNAR/DNI  Plan to complete a MOST form  prior to discharge  [name] shares patient will need short term rehab and long term placement thereafter her preference is [facility]  Chronic Pain: - Seen OP by Dr. Laban Emperor Pain Specialist - Butrans 60mcg/hr TD Q7D (per notes last changed on 5/1) - Gabapentin 300mg  Qday - Oxycodone 5mg  PO Q4H PRN - Dilaudid 0.5-27mf IVP Q2H PRN  Ongoing palliative care support  Billing based on [id number]  ______________________________________________________________________________________ Lamarr Lulas [facility] Palliative Medicine Team Team Cell Phone: [phone] Please utilize secure chat with additional questions, if there is no response within 30 minutes please call the above phone number  Palliative Medicine Team providers are available by phone from 7am to 7pm daily and can be reached through the team cell phone.  Should this patient require assistance outside of these hours, please call the patient's attending physician.

## 2022-10-28 NOTE — TOC Progression Note (Signed)
Transition of Care [facility]) - Progression Note    Patient Details  Name: Aaron Gibbs MRN: [id number] Date of Birth: February 04, 1935  [facility] CM/SW Contact  Leander Rams, LCSW Phone Number: 10/28/2022, 1:39 PM  Clinical Narrative:    CSW has started insurance auth for [employer]. Berkley Harvey is pending. TOC will continue to follow.    Expected Discharge Plan: Skilled Nursing Facility Barriers to Discharge: Continued Medical Work up, English as a second language teacher, SNF Pending bed offer  Expected Discharge Plan and Services In-house Referral: Clinical Social Work     Living arrangements for the past 2 months: Single Family Home                                       Social Determinants of Health (SDOH) Interventions SDOH Screenings   Food Insecurity: No Food Insecurity (10/11/2022)  Housing: Low Risk  (10/11/2022)  Transportation Needs: No Transportation Needs (10/11/2022)  Utilities: Not At Risk (10/11/2022)  Alcohol Screen: Low Risk  (07/13/2022)  Depression (PHQ2-9): Low Risk  (09/28/2022)  Recent Concern: Depression (PHQ2-9) - High Risk (09/13/2022)  Financial Resource Strain: Low Risk  (07/13/2022)  Physical Activity: Inactive (07/13/2022)  Social Connections: Socially Isolated (07/13/2022)  Stress: Stress Concern Present (07/13/2022)  Tobacco Use: High Risk (10/24/2022)    Readmission Risk Interventions     No data to display         Oletta Lamas, MSW, LCSWA, LCASA Transitions of Care  Clinical Social Worker I

## 2022-10-28 NOTE — Progress Notes (Signed)
Orthopedic Tech Progress Note Patient Details:  Aaron Gibbs [birth year] [id number]  Ortho Devices Type of Ortho Device: Soft collar Ortho Device/Splint Location: dropped off at nurse's station per request on the phone Ortho Device/Splint Interventions: Ordered   Post Interventions Patient Tolerated: Other (comment) Instructions Provided: Other (comment)  Docia Furl 10/28/2022, 2:44 PM

## 2022-10-28 NOTE — Progress Notes (Signed)
Spoke to pt's dtr [name] re SNF offers and she has chosen [facility]. Also discussed palliative care following per Palliative Medicine APP recommendation and dtr is agreeable. Palliative provider choice offered and dtr prefers [employer] [employer]. Referral made to [facility] with [facility]. Notified Kia in [facility] admissions of acceptance. Will start [employer] auth and provide updates as available.   Dellie Burns, MSW, LCSW [phone] (coverage)

## 2022-10-28 NOTE — Progress Notes (Signed)
PROGRESS NOTE    Aaron Gibbs  HQI:[id number] DOB: August 05, 1934 DOA: 10/17/2022 PCP: Smitty Cords, DO   Brief Narrative: Aaron Gibbs is a 88 y.o. male with a history of blindness, chronic pain, COPD, depression, GERD, hypertension. Patient presented secondary to a fall and found to have a C2 vertebral fracture. Neurosurgery consulted and recommended no surgical management. Patient with associated AKI that responded well to IV fluids. Plan to discharge to SNF.  Assessment and Plan:  AKI Creatinine of 1.90 on admission. Resolved with IV fluids.  Hypokalemia Low potassium of 3.1. Resolved with supplementation.  C2 cervical fracture Secondary to fall. Minimally displaced fracture noted on CT. Neurosurgery consulted and recommended soft collar for stabilization x6 weeks. 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 Discussed with neurosurgery; no change to management but recommended aspirin if able. Patient with FOBT positive stool, so will defer aspirin until GI evaluation. -Continue soft collar  COVID-19 infection Previously diagnosed prior to admission on 4/24. Asymptomatic.  Lactic acidosis Present on admission. Improved with IV fluids.  Positive blood cultures Patient with blood cultures positive for staphylococcus epidermidis and staphylococcus hominis in separate sets of blood cultures.  Fall Unclear etiology. Polypharmacy is a possible etiology for falls.  Chronic HFpEF Stable. -Continue Coreg and Bumex  COPD Stable.  Primary hypertension -Continue Coreg  Chronic pain -Continue buprenorphine patch  Infrarenal abdominal aortic aneurysm Chronic. Outpatient vascular surgery follow-up  FOBT positive Noted on admission. No overt melena or hematochezia noted. Hemoglobin stable. Can follow-up outpatient.  Depression -Continue Cymbalta  Metastatic melanoma Recently diagnosed. Consideration for surgical excision  while inpatient per oncology recommendations. General surgery consulted and recommended to defer surgical excision to outpatient.    DVT prophylaxis: SCDs Code Status:   Code Status: DNR Family Communication: None at bedside. Disposition Plan: Discharge to SNF when bed is available   Consultants:  Neurosurgery Medical oncology Palliative care medicine General surgery  Procedures:  None  Antimicrobials: Vancomycin Cefepime Flagyl    Subjective: No issues noted from overnight events.  Objective: BP (!) 147/62 (BP Location: Right Arm)   Pulse 91   Temp 98.5 F (36.9 C) (Oral)   Resp 16   Ht 5\' 10"  (1.778 m)   Wt 97.9 kg   SpO2 91%   BMI 30.97 kg/m   Examination:  General: No distress  Data Reviewed: I have personally reviewed following labs and imaging studies  CBC Lab Results  Component Value Date   WBC 3.9 (L) 10/23/2022   RBC 3.35 (L) 10/23/2022   HGB 10.1 (L) 10/23/2022   HCT 31.5 (L) 10/23/2022   MCV 94.0 10/23/2022   MCH 30.1 10/23/2022   PLT 150 10/23/2022   MCHC 32.1 10/23/2022   RDW 17.9 (H) 10/23/2022   LYMPHSABS 0.9 10/18/2022   MONOABS 1.3 (H) 10/18/2022   EOSABS 0.1 10/18/2022   BASOSABS 0.0 10/18/2022     Last metabolic panel Lab Results  Component Value Date   NA 141 10/23/2022   K 3.8 10/23/2022   CL 112 (H) 10/23/2022   CO2 23 10/23/2022   BUN 13 10/23/2022   CREATININE 0.81 10/23/2022   GLUCOSE 119 (H) 10/23/2022   GFRNONAA >60 10/23/2022   GFRAA >60 02/09/2020   CALCIUM 8.7 (L) 10/23/2022   PHOS 3.4 03/30/2019   PROT 5.1 (L) 10/18/2022   ALBUMIN 2.6 (L) 10/18/2022   LABGLOB 2.7 06/09/2022   AGRATIO 2.1 07/10/2018   BILITOT 1.3 (H) 10/18/2022   ALKPHOS  41 10/18/2022   AST 24 10/18/2022   ALT 26 10/18/2022   ANIONGAP 6 10/23/2022    GFR: Estimated Creatinine Clearance: 78.3 mL/min (by C-G formula based on SCr of 0.81 mg/dL).  No results found for this or any previous visit (from the past 240 hour(s)).      Radiology Studies: No results found.    LOS: 10 days    Jacquelin Hawking, MD [facility] 10/28/2022, 3:58 PM   If 7PM-7AM, please contact night-coverage [email or web address]

## 2022-10-28 NOTE — Progress Notes (Signed)
[facility] Liaison Note  Notified by TOC/Josie of patient/family request of [facility] Paliative services. Contacted daughter/Lisa per PMT NP/Michelle F. To provide further education of palliative vs. Hospice. No response, VM left requesting call returned.   [facility] hospital liaison will follow patient for discharge disposition.   Please call with any questions/concerns.    Thank you for the opportunity to participate in this patient's care.   Eugenie Birks, MSW [facility] Liaison  [phone]

## 2022-10-29 DIAGNOSIS — N179 Acute kidney failure, unspecified: Secondary | ICD-10-CM | POA: Diagnosis not present

## 2022-10-29 MED ORDER — OXYCODONE HCL 5 MG PO TABS
10.0000 mg | ORAL_TABLET | ORAL | Status: DC | PRN
  Administered 2022-10-29 – 2022-10-30 (×5): 10 mg via ORAL
  Filled 2022-10-29 (×5): qty 2

## 2022-10-29 MED ORDER — POLYETHYLENE GLYCOL 3350 17 G PO PACK
17.0000 g | PACK | Freq: Two times a day (BID) | ORAL | Status: DC
  Administered 2022-10-29 – 2022-10-30 (×3): 17 g via ORAL
  Filled 2022-10-29 (×3): qty 1

## 2022-10-29 NOTE — Progress Notes (Signed)
Pt. OOB to chair via 2 assist, tol. Well  Margarita Grizzle

## 2022-10-29 NOTE — Progress Notes (Signed)
Palliative Medicine Inpatient Follow Up Note HPI:  88 year old male with past medical history of blindness, chronic pain on narcotics, COPD, depression, GERD, hypertension who was recently discharged from [facility] yesterday presented to our hospital after sustaining a fall in the middle of the night.  Patient was brought into the hospital by EMS.  He has had 2 falls at home since discharge.   Palliative care has been asked to get involved to help establish goals of care in the setting of chronic comorbid conditions and overall declining health.  Today's Discussion 10/29/2022  *Please note that this is a verbal dictation therefore any spelling or grammatical errors are due to the "Dragon Medical One" system interpretation.  Chart reviewed inclusive of vital signs, progress notes, laboratory results, and diagnostic images.   Per patients nurse he still refuses to eat though is drinking ensure. He is not working with nursing to get OOB.   I met with Aaron Gibbs at bedside this morning. He remains to be in pain and vocalizes otherwise being "fine". He appears to not want to be bothered.   I called and spoke to patients daughter, Aaron Gibbs. We completed a MOST for as below:  Cardiopulmonary Resuscitation: Do Not Attempt Resuscitation (DNR/No CPR)  Medical Interventions: Limited Additional Interventions: Use medical treatment, IV fluids and cardiac monitoring as indicated, DO NOT USE intubation or mechanical ventilation. May consider use of less invasive airway support such as BiPAP or CPAP. Also provide comfort measures. Transfer to the hospital if indicated. Avoid intensive care.   Antibiotics: Determine use of limitation of antibiotics when infection occurs  IV Fluids: No IV fluids (provide other measures to ensure comfort)  Feeding Tube: No feeding tube   We did discuss patients lack of desire to eat/drink and his poor will. We reviewed it is likely he will require hospice in the  near future. We discussed Aaron Gibbs talking to the hospice liaison about options moving forward if he is at rehabilitation and doe not improve and what transition to hospice would look like at that time.  Questions and concerns addressed/Palliative Support Provided.   Objective Assessment: Vital Signs Vitals:   10/29/22 0503 10/29/22 0900  BP: (!) 114/59 (!) 158/57  Pulse: 83 84  Resp:  15  Temp: 99.9 F (37.7 C) 97.6 F (36.4 C)  SpO2: 95% 95%    Intake/Output Summary (Last 24 hours) at 10/29/2022 1419 Last data filed at 10/29/2022 0800 Gross per 24 hour  Intake 690 ml  Output --  Net 690 ml    Last Weight  Most recent update: 10/27/2022  9:43 PM    Weight  97.9 kg (215 lb 13.3 oz)            Gen: Elderly chronically ill-appearing Caucasian male in no acute distress HEENT: Dry mucous membranes CV: Regular rate and rhythm PULM: On room air breathing is even and unlabored ABD: soft/nontender EXT: RLE edema Neuro: Alert and oriented x2  SUMMARY OF RECOMMENDATIONS   DNAR/DNI  E-MOST completed and is in [facility]  Appreciate Hospice liaison reaching out to patients daughter regarding Hospice options in the future. At this time plan for OP Palliative support on discharge through [facility]  Chronic Pain: - Seen OP by Dr. Laban Emperor Pain Specialist - Lavera Guise 38mcg/hr TD Q7D  - Gabapentin 300mg  Qday - Increase Oxycodone to 10mg  PO Q4H PRN - Dilaudid 0.5-1mg  IVP Q2H PRN  The PMT will remain peripheral at this time, please call if additional support is needed  Time: 71 Billing based on MDM: High ______________________________________________________________________________________ Aaron Gibbs [place] Palliative Medicine Team Team Cell Phone: [phone] Please utilize secure chat with additional questions, if there is no response within 30 minutes please call the above phone number  Palliative Medicine Team providers are available by phone from 7am to 7pm daily  and can be reached through the team cell phone.  Should this patient require assistance outside of these hours, please call the patient's attending physician.

## 2022-10-29 NOTE — Progress Notes (Signed)
PROGRESS NOTE    Aaron Gibbs  ZOX:[id number] DOB: 03/03/1935 DOA: 10/17/2022 PCP: Smitty Cords, DO   Brief Narrative: Aaron Gibbs is a 88 y.o. male with a history of blindness, chronic pain, COPD, depression, GERD, hypertension. Patient presented secondary to a fall and found to have a C2 vertebral fracture. Neurosurgery consulted and recommended no surgical management. Patient with associated AKI that responded well to IV fluids. Plan to discharge to SNF.  Assessment and Plan:  AKI Creatinine of 1.90 on admission. Resolved with IV fluids.  Hypokalemia Low potassium of 3.1. Resolved with supplementation.  C2 cervical fracture Secondary to fall. Minimally displaced fracture noted on CT. Neurosurgery consulted and recommended soft collar for stabilization x6 weeks. 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 Discussed with neurosurgery; no change to management but recommended aspirin if able. Patient with FOBT positive stool, so will defer aspirin until GI evaluation. -Continue soft collar  COVID-19 infection Previously diagnosed prior to admission on 4/24. Asymptomatic.  Lactic acidosis Present on admission. Improved with IV fluids.  Positive blood cultures Patient with blood cultures positive for staphylococcus epidermidis and staphylococcus hominis in separate sets of blood cultures.  Constipation -MiraLAX BID  Fall Unclear etiology. Polypharmacy is a possible etiology for falls.  Chronic HFpEF Stable. -Continue Coreg and Bumex  COPD Stable.  Primary hypertension -Continue Coreg  Chronic pain -Continue buprenorphine patch  Infrarenal abdominal aortic aneurysm Chronic. Outpatient vascular surgery follow-up  FOBT positive Noted on admission. No overt melena or hematochezia noted. Hemoglobin stable. Can follow-up outpatient.  Depression -Continue Cymbalta  Metastatic melanoma Recently diagnosed.  Consideration for surgical excision while inpatient per oncology recommendations. General surgery consulted and recommended to defer surgical excision to outpatient.    DVT prophylaxis: SCDs Code Status:   Code Status: DNR Family Communication: None at bedside. Disposition Plan: Discharge to SNF when bed is available   Consultants:  Neurosurgery Medical oncology Palliative care medicine General surgery  Procedures:  None  Antimicrobials: Vancomycin Cefepime Flagyl    Subjective: No events noted overnight. No recent bowel movement.  Objective: BP (!) 158/57 (BP Location: Left Arm)   Pulse 84   Temp 97.6 F (36.4 C) (Oral)   Resp 15   Ht 5\' 10"  (1.778 m)   Wt 97.9 kg   SpO2 95%   BMI 30.97 kg/m   Examination:  General: No distress. Laying in bed.  Data Reviewed: I have personally reviewed following labs and imaging studies  CBC Lab Results  Component Value Date   WBC 3.9 (L) 10/23/2022   RBC 3.35 (L) 10/23/2022   HGB 10.1 (L) 10/23/2022   HCT 31.5 (L) 10/23/2022   MCV 94.0 10/23/2022   MCH 30.1 10/23/2022   PLT 150 10/23/2022   MCHC 32.1 10/23/2022   RDW 17.9 (H) 10/23/2022   LYMPHSABS 0.9 10/18/2022   MONOABS 1.3 (H) 10/18/2022   EOSABS 0.1 10/18/2022   BASOSABS 0.0 10/18/2022     Last metabolic panel Lab Results  Component Value Date   NA 141 10/23/2022   K 3.8 10/23/2022   CL 112 (H) 10/23/2022   CO2 23 10/23/2022   BUN 13 10/23/2022   CREATININE 0.81 10/23/2022   GLUCOSE 119 (H) 10/23/2022   GFRNONAA >60 10/23/2022   GFRAA >60 02/09/2020   CALCIUM 8.7 (L) 10/23/2022   PHOS 3.4 03/30/2019   PROT 5.1 (L) 10/18/2022   ALBUMIN 2.6 (L) 10/18/2022   LABGLOB 2.7 06/09/2022   AGRATIO 2.1 07/10/2018  BILITOT 1.3 (H) 10/18/2022   ALKPHOS 41 10/18/2022   AST 24 10/18/2022   ALT 26 10/18/2022   ANIONGAP 6 10/23/2022    GFR: Estimated Creatinine Clearance: 78.3 mL/min (by C-G formula based on SCr of 0.81 mg/dL).  No results found for  this or any previous visit (from the past 240 hour(s)).     Radiology Studies: No results found.    LOS: 11 days    Jacquelin Hawking, MD [facility] 10/29/2022, 11:59 AM   If 7PM-7AM, please contact night-coverage [email or web address]

## 2022-10-29 NOTE — Plan of Care (Signed)

## 2022-10-29 NOTE — Progress Notes (Signed)
Pt. Yelling and requesting to go back to bed. Pt. Transferred back to bed via 2 assist, pt. Refusing to eat breakfast and dinner x 2 days, states he has no appetite, but pt. Drinking his Ensure, will cont. To monitor  Aaron Gibbs

## 2022-10-30 ENCOUNTER — Telehealth: Payer: Self-pay

## 2022-10-30 ENCOUNTER — Telehealth: Payer: Medicare Other

## 2022-10-30 DIAGNOSIS — N179 Acute kidney failure, unspecified: Secondary | ICD-10-CM | POA: Diagnosis not present

## 2022-10-30 MED ORDER — ENSURE ENLIVE PO LIQD: 237.0000 mL | Freq: Three times a day (TID) | ORAL | Status: DC

## 2022-10-30 MED ORDER — POLYETHYLENE GLYCOL 3350 17 G PO PACK: 17.0000 g | PACK | Freq: Two times a day (BID) | ORAL | Status: DC

## 2022-10-30 MED ORDER — BUPRENORPHINE 10 MCG/HR TD PTWK
1.0000 | MEDICATED_PATCH | TRANSDERMAL | 0 refills | Status: DC
Start: 2022-11-01 — End: 2022-12-20

## 2022-10-30 MED ORDER — OXYCODONE HCL 10 MG PO TABS: 5.0000 mg | ORAL_TABLET | ORAL | 0 refills | Status: DC | PRN

## 2022-10-30 NOTE — TOC Transition Note (Signed)
Transition of Care [facility]) - CM/SW Discharge Note   Patient Details  Name: Aaron Gibbs MRN: [id number] Date of Birth: July 08, 1934  [facility] CM/SW Contact:  Lorri Frederick, LCSW Phone Number: 10/30/2022, 1:10 PM   Clinical Narrative:   Pt discharging to [facility], room 122B.  RN call report to [phone].      Final next level of care: Skilled Nursing Facility Barriers to Discharge: Barriers Resolved   Patient Goals and CMS Choice CMS Medicare.gov Compare Post Acute Care list provided to:: Patient Choice offered to / list presented to : Patient, Adult Children  Discharge Placement                Patient chooses bed at: [facility] Patient to be transferred to facility by: [facility] Name of family member notified: daughter [name] Patient and family notified of of transfer: 10/30/22  Discharge Plan and Services Additional resources added to the After Visit Summary for   In-house Referral: Clinical Social Work                                   Social Determinants of Health (SDOH) Interventions SDOH Screenings   Food Insecurity: No Food Insecurity (10/11/2022)  Housing: Low Risk  (10/11/2022)  Transportation Needs: No Transportation Needs (10/11/2022)  Utilities: Not At Risk (10/11/2022)  Alcohol Screen: Low Risk  (07/13/2022)  Depression (PHQ2-9): Low Risk  (09/28/2022)  Recent Concern: Depression (PHQ2-9) - High Risk (09/13/2022)  Financial Resource Strain: Low Risk  (07/13/2022)  Physical Activity: Inactive (07/13/2022)  Social Connections: Socially Isolated (07/13/2022)  Stress: Stress Concern Present (07/13/2022)  Tobacco Use: High Risk (10/24/2022)     Readmission Risk Interventions     No data to display

## 2022-10-30 NOTE — Plan of Care (Signed)

## 2022-10-30 NOTE — Progress Notes (Signed)
DISCHARGE NOTE HOME Aaron Gibbs to be discharged Skilled nursing facility per MD order. Discussed prescriptions and follow up appointments with the patient and staff Catering manager, LPN) at [place]. Prescriptions given to [facility] staff. medication list explained in detail. Patient verbalized understanding.  Skin clean, dry and intact without evidence of skin break down, no evidence of skin tears noted. IV catheter discontinued intact. Site without signs and symptoms of complications. Dressing and pressure applied. Pt denies pain at the site currently. No complaints noted.  Patient free of lines, drains, and wounds.   An After Visit Summary (AVS) was printed and given to the patient. Patient transferred via Melany Guernsey, RN

## 2022-10-30 NOTE — Discharge Summary (Signed)
Physician Discharge Summary   Patient: Aaron Gibbs MRN: [id number] DOB: 21-Sep-1934  Admit date:     10/17/2022  Discharge date: 10/30/22  Discharge Physician: Jacquelin Hawking, MD   PCP: Smitty Cords, DO   Recommendations at discharge:  PCP General surgery follow-up for consideration of excision of melanoma GI follow-up for FOBT positive stools if desired Consideration for hospice referral in near future  Discharge Diagnoses: Principal Problem:   AKI (acute kidney injury) [facility]) Active Problems:   C2 cervical fracture (HCC)   Fall at home, initial encounter   COVID-19   Chronic pain syndrome   Essential hypertension   COPD (chronic obstructive pulmonary disease) (HCC)   Chronic heart failure with preserved ejection fraction (HCC)   DNR (do not resuscitate)   Infrarenal abdominal aortic aneurysm (AAA) without rupture (HCC)   Hypokalemia  Resolved Problems:   * No resolved hospital problems. [facility] Course: Toi Dovell is a 88 y.o. male with a history of blindness, chronic pain, COPD, depression, GERD, hypertension. Patient presented secondary to a fall and found to have a C2 vertebral fracture. Neurosurgery consulted and recommended no surgical management. Patient with associated AKI that responded well to IV fluids. Discharged to SNF.  Assessment and Plan:  AKI Creatinine of 1.90 on admission. Resolved with IV fluids.   Hypokalemia Low potassium of 3.1. Resolved with supplementation.   C2 cervical fracture Secondary to fall. Minimally displaced fracture noted on CT. Neurosurgery consulted and recommended soft collar for stabilization x6 weeks. 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 Discussed with neurosurgery; no change to management but recommended aspirin if able. Patient with FOBT positive stool, so will defer aspirin until GI evaluation. Continue soft collar.   COVID-19 infection Previously diagnosed prior to  admission on 4/24. Asymptomatic.   Lactic acidosis Present on admission. Improved with IV fluids.   Positive blood cultures Patient with blood cultures positive for staphylococcus epidermidis and staphylococcus hominis in separate sets of blood cultures.   Constipation MiraLAX BID   Fall Unclear etiology. Polypharmacy is a possible etiology for falls.   Chronic HFpEF Stable. Continue Coreg and Bumex   COPD Stable.   Primary hypertension Continue Coreg   Chronic pain Continue buprenorphine patch   Infrarenal abdominal aortic aneurysm Chronic. Outpatient vascular surgery follow-up   FOBT positive Noted on admission. No overt melena or hematochezia noted. Hemoglobin stable. Can follow-up outpatient.   Depression Continue Cymbalta   Metastatic melanoma Recently diagnosed. Consideration for surgical excision while inpatient per oncology recommendations. General surgery consulted and recommended to defer surgical excision to outpatient.   Consultants:  Neurosurgery Medical oncology Palliative care medicine General surgery  Procedures performed: None  Disposition: Home Diet recommendation: Regular diet   DISCHARGE MEDICATION: Allergies as of 10/30/2022       Reactions   Anthralin Shortness Of Breath   Azithromycin Shortness Of Breath        Medication List     STOP taking these medications    LORazepam 0.5 MG tablet Commonly known as: ATIVAN       TAKE these medications    acetaminophen 500 MG tablet Commonly known as: TYLENOL Take 1,000 mg by mouth in the morning, at noon, and at bedtime.   bumetanide 1 MG tablet Commonly known as: BUMEX Take 1/2 (one-half) tablet by mouth twice daily What changed: See the new instructions.   buprenorphine 10 MCG/HR Ptwk Commonly known as: BUTRANS Place 1 patch onto the skin once  a week. Apply only 1 patch at a time and alternate sites weekly. Start taking on: Nov 01, 2022   buprenorphine 10 MCG/HR  Ptwk Commonly known as: BUTRANS Place 1 patch onto the skin once a week for 28 days. Apply only 1 patch at a time and alternate sites weekly. Start taking on: November 29, 2022   carvedilol 12.5 MG tablet Commonly known as: COREG Take 1 tablet by mouth twice daily   Cosopt 2-0.5 % ophthalmic solution Generic drug: dorzolamide-timolol Place 1 drop into both eyes 2 (two) times daily.   diclofenac Sodium 1 % Gel Commonly known as: VOLTAREN Apply 2 g topically at bedtime. Apply to both knees twice daily What changed: additional instructions   DULoxetine 30 MG capsule Commonly known as: CYMBALTA Take 3 capsules (90 mg total) by mouth daily.   feeding supplement Liqd Take 237 mLs by mouth 4 (four) times daily -  before meals and at bedtime.   finasteride 5 MG tablet Commonly known as: Proscar Take 1 tablet (5 mg total) by mouth daily.   gabapentin 300 MG capsule Commonly known as: NEURONTIN Take 1 capsule (300 mg total) by mouth daily. What changed: when to take this   Iron (Ferrous Sulfate) 325 (65 Fe) MG Tabs Take 325 mg by mouth daily with supper.   latanoprost 0.005 % ophthalmic solution Commonly known as: XALATAN Place 1 drop into both eyes at bedtime.   Melatonin 5 MG Caps Take 1 capsule (5 mg total) by mouth at bedtime. What changed: how much to take   meloxicam 15 MG tablet Commonly known as: MOBIC Take 15 mg by mouth daily.   OLANZapine 2.5 MG tablet Commonly known as: ZYPREXA Take 1 tablet by mouth twice daily   Oxycodone HCl 10 MG Tabs Take 0.5-1 tablets (5-10 mg total) by mouth every 4 (four) hours as needed for severe pain or moderate pain.   pantoprazole 40 MG tablet Commonly known as: PROTONIX Take 1 tablet (40 mg total) by mouth daily.   polyethylene glycol 17 g packet Commonly known as: MIRALAX / GLYCOLAX Take 17 g by mouth 2 (two) times daily.   tamsulosin 0.4 MG Caps capsule Commonly known as: FLOMAX Take 1 capsule by mouth once daily    traZODone 150 MG tablet Commonly known as: DESYREL TAKE 1 TABLET BY MOUTH AT BEDTIME AS NEEDED What changed: when to take this        Contact information for follow-up providers     [name], MD Follow up on 11/09/2022.   Specialty: General Surgery Why: 145pm. Please arrive 30 minutes prior to your appointment for paperwork. Please bring a copy of your photo ID and insurance card. Contact information: [address] [place] [address] [phone]              Contact information for after-discharge care     Destination     [facility] Preferred SNF .   Service: Skilled Nursing Contact information: [address] [place] [address] [phone]                    Discharge Exam: BP 139/62 (BP Location: Right Arm)   Pulse 70   Temp 97.7 F (36.5 C) (Oral)   Resp 18   Ht 5\' 10"  (1.778 m)   Wt 96.7 kg   SpO2 92%   BMI 30.59 kg/m   General exam: Appears calm and comfortable Respiratory system: Respiratory effort normal.  Condition at discharge: fair  The results of significant diagnostics from this hospitalization (including imaging, microbiology, ancillary and laboratory) are listed below for reference.   Imaging Studies: CT ANGIO NECK W OR WO CONTRAST  Result Date: 10/25/2022 CLINICAL DATA:  Cervical spine fracture with possible vertebral artery injury. EXAM: CT ANGIOGRAPHY NECK TECHNIQUE: Multidetector CT imaging of the neck was performed using the standard protocol during bolus administration of intravenous contrast. Multiplanar CT image reconstructions and MIPs were obtained to evaluate the vascular anatomy. Carotid stenosis measurements (when applicable) are obtained utilizing NASCET criteria, using the distal internal carotid diameter as the denominator. RADIATION DOSE REDUCTION: This exam was performed according to the departmental dose-optimization program which includes automated exposure  control, adjustment of the mA and/or kV according to patient size and/or use of iterative reconstruction technique. CONTRAST:  75mL OMNIPAQUE IOHEXOL 350 MG/ML SOLN COMPARISON:  CT cervical spine 10/17/2022 FINDINGS: Aortic arch: 3 vessel branch pattern. Mild calcific atherosclerosis. No aneurysm or dissection. Right carotid system: Atherosclerotic calcification at the bifurcation with less than 50% stenosis. Left carotid system: Mixed density atherosclerosis within the proximal ICA without hemodynamically significant stenosis. Vertebral arteries: Left dominant system. The left vertebral artery is normal the along its entire course. The right vertebral artery V2 segment is markedly narrowed beginning at the C3 level and occluded at C2. There is reconstitution of the V4 segment via collateral flow. Limited intracranial: No proximal occlusion or stenosis of the circle-of-Willis vessels. Skeleton: Known right lateral mass fracture of C2. Cervicothoracic ankylosis. Other neck: Negative Upper chest: Clear IMPRESSION: 1. 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 Biffl grade 4 blunt cerebrovascular injury. 2. Known right lateral mass fracture of C2. 3. Bilateral carotid bifurcation atherosclerosis without hemodynamically significant stenosis. Electronically Signed   By: Deatra Robinson M.D.   On: 10/25/2022 19:21   CT Lumbar Spine Wo Contrast  Result Date: 10/17/2022 CLINICAL DATA:  88 year old male with recent falls. Spinal ankylosis. EXAM: CT LUMBAR SPINE WITH CONTRAST TECHNIQUE: Technique: Multiplanar CT images of the lumbar spine were reconstructed from contemporary CT of the Abdomen and Pelvis. RADIATION DOSE REDUCTION: This exam was performed according to the departmental dose-optimization program which includes automated exposure control, adjustment of the mA and/or kV according to patient size and/or use of iterative reconstruction  technique. CONTRAST:  No additional COMPARISON:  CT Abdomen and Pelvis today reported separately. Thoracic and lumbar spine CT 10/10/2022. FINDINGS: Segmentation: Normal on the comparison. There is a vestigial S1-S2 disc space. Alignment: Stable lumbar lordosis. No significant lumbar scoliosis or spondylolisthesis. Vertebrae: Partially visible diffuse spinal, bilateral SI joint ankylosis and bilateral lower rib costovertebral as noted on the recent comparisons. Associated osteopenia. Maintained lower thoracic and lumbar vertebral height and alignment. No acute osseous abnormality identified. Paraspinal and other soft tissues: Lower chest, abdomen and pelvis viscera are detailed separately. Paraspinal soft tissues remarkable for generalized paraspinal muscle atrophy. Also T12 level left paraspinal hematoma or contusion partially visible on series 3, image 39 and better demonstrated on series 4, image 15 of the CT Abdomen and Pelvis today. This measures about 7 cm long axis, situated in the atrophied paraspinal muscle and subcutaneous layer. No nearby posterior left lower rib fracture is identified. Disc levels: Diffuse spinal ankylosis. No CT evidence of lumbar spinal stenosis. IMPRESSION: 1. Diffuse spinal ankylosis (ankylosing spondylitis) with no acute osseous abnormality in the Lumbar Spine. 2. Left paraspinal hematoma (7 cm) at the T12 vertebral level.  3. CT Abdomen and Pelvis today reported separately. Electronically Signed   By: Odessa Fleming M.D.   On: 10/17/2022 04:46   CT ABDOMEN PELVIS W CONTRAST  Result Date: 10/17/2022 CLINICAL DATA:  Frequent falls. Most recently yesterday. Abdominal and back pain secondary to falls. EXAM: CT ABDOMEN AND PELVIS WITH CONTRAST TECHNIQUE: Multidetector CT imaging of the abdomen and pelvis was performed using the standard protocol following bolus administration of intravenous contrast. RADIATION DOSE REDUCTION: This exam was performed according to the departmental  dose-optimization program which includes automated exposure control, adjustment of the mA and/or kV according to patient size and/or use of iterative reconstruction technique. CONTRAST:  75mL OMNIPAQUE IOHEXOL 350 MG/ML SOLN COMPARISON:  Chest, abdomen and pelvis CT with IV contrast 1 week ago 10/10/2022. FINDINGS: Technical note: Patient could not lie flat and was scanned right lateral decubitus. The patient's arms were also in the field creating streak artifact. Lower chest: There are positional atelectatic changes on the right, linear scarring or atelectasis in the lingula. Lung bases are clear of infiltrate. The cardiac size is normal. Hepatobiliary: Not as well seen as previously due to artifact from breathing motion and the patient's arms in the field. No mass or laceration is seen through the artifacts. The liver is mildly steatotic. Old cholecystectomy again noted with extrahepatic and central intrahepatic biliary prominence, as before, with the common bile duct again measuring 13 mm. No ductal filling defect is seen. Pancreas: Moderately atrophic.  No other visible abnormality. Spleen: No mass or laceration is seen through the motion and streak artifact. Adrenals/Urinary Tract: There is no adrenal mass. Again noted is a 1.4 cm heterogeneous indeterminate lesion of the dorsal superior pole right kidney, better seen on the prior study, slightly smaller simple cyst more laterally in the upper pole. No other focal renal abnormality is seen. Renal mass protocol MRI is recommended when clinically feasible. There is no urinary stone or obstruction, no bladder thickening. Stomach/Bowel: No dilatation or wall thickening, including the appendix. There is colonic diverticulosis without evidence of diverticulitis. Vascular/Lymphatic: There is a partially thrombosed fusiform infrarenal AAA again measuring 5 x 4.3 cm. There is aortoiliac atherosclerosis. No adenopathy is seen. Reproductive: Prostate is unremarkable.  Other: No abdominal wall hernia or focal abnormality. No abdominopelvic ascites. There is moderate abdominal laxity likely obesity related. Musculoskeletal: Diffuse osteopenia. There are diffuse bridging syndesmophytes of the visualized lower thoracic and lumbar spine. There is bony fusion across the distal spinous processes from the lower thoracic spine down. The SI joints are fused. There is moderate bilateral hip DJD. No acute or other significant osseous findings. IMPRESSION: 1. No acute trauma related findings in the abdomen or pelvis. Image quality degraded by motion and streak artifact in the abdomen. 2. 5 x 4.3 cm partially thrombosed fusiform infrarenal AAA. Recommend follow-up every 6 months and vascular consultation. Reference: J Am Coll Radiol 2013;10:789-794. 3. 1.4 cm heterogeneous indeterminate lesion of the dorsal superior pole right kidney, better seen on the prior study. Renal mass protocol MRI is recommended when clinically feasible. 4. Diverticulosis without evidence of diverticulitis. 5. Aortic atherosclerosis. 6. Diffuse spinal bridging syndesmophytes with bony bridging across the distal spinous processes. Bilaterally fused SI joints. Aortic Atherosclerosis (ICD10-I70.0). Electronically Signed   By: Almira Bar M.D.   On: 10/17/2022 04:18   CT HEAD WO CONTRAST ( )  Result Date: 10/17/2022 CLINICAL DATA:  Fall EXAM: CT HEAD WITHOUT CONTRAST CT CERVICAL SPINE WITHOUT CONTRAST TECHNIQUE: Multidetector CT imaging of the head and cervical spine  was performed following the standard protocol without intravenous contrast. Multiplanar CT image reconstructions of the cervical spine were also generated. RADIATION DOSE REDUCTION: This exam was performed according to the departmental dose-optimization program which includes automated exposure control, adjustment of the mA and/or kV according to patient size and/or use of iterative reconstruction technique. COMPARISON:  10/10/2022 FINDINGS: CT HEAD  FINDINGS Brain: There is no mass, hemorrhage or extra-axial collection. The size and configuration of the ventricles and extra-axial CSF spaces are normal. There is hypoattenuation of the periventricular white matter, most commonly indicating chronic ischemic microangiopathy. Vascular: No abnormal hyperdensity of the major intracranial arteries or dural venous sinuses. No intracranial atherosclerosis. Skull: The visualized skull base, calvarium and extracranial soft tissues are normal. Sinuses/Orbits: No fluid levels or advanced mucosal thickening of the visualized paranasal sinuses. No mastoid or middle ear effusion. The orbits are normal. CT CERVICAL SPINE FINDINGS Alignment: No static subluxation. Facets are aligned. Occipital condyles are normally positioned. Skull base and vertebrae: Minimally displaced fracture of C2, at the base of the odontoid. Fracture extends to the right lateral mass. (AO Spine C2 type A). No other acute fracture. There is diffuse ankylosis. Soft tissues and spinal canal: No prevertebral fluid or swelling. No visible canal hematoma. Disc levels: No advanced spinal canal or neural foraminal stenosis. Upper chest: No pneumothorax, pulmonary nodule or pleural effusion. Other: Normal visualized paraspinal cervical soft tissues. IMPRESSION: 1. No acute intracranial abnormality. 2. Minimally displaced fracture of C2, at the base of the odontoid. Fracture extends to the right lateral mass (AO Spine C2 type A). 3. Consider CTA neck for assessment of the right vertebral artery since the C2 fracture affects the right transverse foramen. These results were called by telephone at the time of interpretation on 10/17/2022 at 3:55 am to provider Roxy Horseman , who verbally acknowledged these results. Electronically Signed   By: Deatra Robinson M.D.   On: 10/17/2022 03:56   CT Cervical Spine Wo Contrast  Result Date: 10/17/2022 CLINICAL DATA:  Fall EXAM: CT HEAD WITHOUT CONTRAST CT CERVICAL SPINE  WITHOUT CONTRAST TECHNIQUE: Multidetector CT imaging of the head and cervical spine was performed following the standard protocol without intravenous contrast. Multiplanar CT image reconstructions of the cervical spine were also generated. RADIATION DOSE REDUCTION: This exam was performed according to the departmental dose-optimization program which includes automated exposure control, adjustment of the mA and/or kV according to patient size and/or use of iterative reconstruction technique. COMPARISON:  10/10/2022 FINDINGS: CT HEAD FINDINGS Brain: There is no mass, hemorrhage or extra-axial collection. The size and configuration of the ventricles and extra-axial CSF spaces are normal. There is hypoattenuation of the periventricular white matter, most commonly indicating chronic ischemic microangiopathy. Vascular: No abnormal hyperdensity of the major intracranial arteries or dural venous sinuses. No intracranial atherosclerosis. Skull: The visualized skull base, calvarium and extracranial soft tissues are normal. Sinuses/Orbits: No fluid levels or advanced mucosal thickening of the visualized paranasal sinuses. No mastoid or middle ear effusion. The orbits are normal. CT CERVICAL SPINE FINDINGS Alignment: No static subluxation. Facets are aligned. Occipital condyles are normally positioned. Skull base and vertebrae: Minimally displaced fracture of C2, at the base of the odontoid. Fracture extends to the right lateral mass. (AO Spine C2 type A). No other acute fracture. There is diffuse ankylosis. Soft tissues and spinal canal: No prevertebral fluid or swelling. No visible canal hematoma. Disc levels: No advanced spinal canal or neural foraminal stenosis. Upper chest: No pneumothorax, pulmonary nodule or pleural effusion. Other: Normal  visualized paraspinal cervical soft tissues. IMPRESSION: 1. No acute intracranial abnormality. 2. Minimally displaced fracture of C2, at the base of the odontoid. Fracture extends to  the right lateral mass (AO Spine C2 type A). 3. Consider CTA neck for assessment of the right vertebral artery since the C2 fracture affects the right transverse foramen. These results were called by telephone at the time of interpretation on 10/17/2022 at 3:55 am to provider Roxy Horseman , who verbally acknowledged these results. Electronically Signed   By: Deatra Robinson M.D.   On: 10/17/2022 03:56   DG Pelvis Portable  Result Date: 10/17/2022 CLINICAL DATA:  Recent fall with pelvic pain, initial encounter EXAM: PORTABLE PELVIS 1-2 VIEWS COMPARISON:  10/10/2022 FINDINGS: Degenerative changes of the hip joints are noted bilaterally. Pelvic ring is rotated to the right although no acute fracture is seen. No soft tissue abnormality is noted. IMPRESSION: No acute abnormality noted. Electronically Signed   By: Alcide Clever M.D.   On: 10/17/2022 03:03   DG Chest Port 1 View  Result Date: 10/17/2022 CLINICAL DATA:  Recent fall with chest pain, initial encounter EXAM: PORTABLE CHEST 1 VIEW COMPARISON:  06/16/2022 FINDINGS: Cardiac shadow is within normal limits. Patient is significantly rotated to the right accentuating the mediastinal markings. The lungs are clear. No acute bony abnormality is noted. IMPRESSION: No acute abnormality noted. Electronically Signed   By: Alcide Clever M.D.   On: 10/17/2022 03:02   CT CHEST ABDOMEN PELVIS W CONTRAST  Result Date: 10/10/2022 CLINICAL DATA:  Polytrauma, blunt, fall, left flank pain EXAM: CT CHEST, ABDOMEN, AND PELVIS WITH CONTRAST TECHNIQUE: Multidetector CT imaging of the chest, abdomen and pelvis was performed following the standard protocol during bolus administration of intravenous contrast. RADIATION DOSE REDUCTION: This exam was performed according to the departmental dose-optimization program which includes automated exposure control, adjustment of the mA and/or kV according to patient size and/or use of iterative reconstruction technique. CONTRAST:  80mL  OMNIPAQUE IOHEXOL 350 MG/ML SOLN COMPARISON:  06/16/2022 FINDINGS: CT CHEST FINDINGS Cardiovascular: Mild multi-vessel coronary artery calcification. Global cardiac size within normal limits. No pericardial effusion. Central pulmonary arteries are of normal caliber. Mild atherosclerotic calcification within the thoracic aorta. No aortic aneurysm. Mediastinum/Nodes: No enlarged mediastinal, hilar, or axillary lymph nodes. Thyroid gland, trachea, and esophagus demonstrate no significant findings. Lungs/Pleura: Right costophrenic angle is partially excluded from view. Bibasilar parenchymal scarring. No pneumothorax or pleural effusion. Bronchial wall thickening in keeping with airway inflammation. No central obstructing lesion. Musculoskeletal: The osseous structures are diffusely osteopenic. Degenerative changes are seen throughout the cervical, thoracic, and lumbar spine with ankylosis of the vertebral column and articular pillars bilaterally. Accentuated thoracic kyphosis. No focal lytic or blastic bone lesion. No acute bone abnormality. CT ABDOMEN PELVIS FINDINGS Hepatobiliary: Status post cholecystectomy. Moderate intra and extrahepatic biliary ductal dilation appears stable since prior examination likely representing post cholecystectomy change. The liver is unremarkable. Pancreas: Unremarkable Spleen: Unremarkable Adrenals/Urinary Tract: The adrenal glands are unremarkable. The kidneys are normal in size and position. In indeterminate 14 mm low-attenuation lesion is seen within the posterior interpolar region of the right kidney which did may demonstrate enhancing internal components but is not optimally characterized on this examination. Adjacent simple cortical cyst noted. The kidneys are otherwise unremarkable. The bladder is unremarkable. Stomach/Bowel: Mild sigmoid diverticulosis. The stomach, small bowel, and large bowel are otherwise unremarkable. Appendix normal. No free intraperitoneal gas or fluid.  Vascular/Lymphatic: Fusiform infrarenal abdominal aortic aneurysm demonstrates slight interval increase in size measuring 4.4  x 5.0 cm in greatest dimension on axial image # 65/7 (previously measuring 4.4 x 4.7 cm). Mild superimposed aortoiliac atherosclerotic calcification. No pathologic adenopathy within the abdomen and pelvis. Reproductive: Prostate is unremarkable. Other: No abdominal wall hernia.  No abdominopelvic ascites. Musculoskeletal: The osseous structures are diffusely osteopenic. Degenerative changes are seen within the lumbar spine and hips bilaterally. The sacroiliac joints and pubic symphysis appear fused. No acute bone abnormality. No lytic or blastic bone lesion. IMPRESSION: 1. No acute intrathoracic or intra-abdominal injury. 2. Mild multi-vessel coronary artery calcification. 3. Mild sigmoid diverticulosis. 4. 14 mm indeterminate lesion within the posterior interpolar region of the right kidney. This may represent a complex cyst, however, this is not optimally characterized on this examination. If clinically indicated, dedicated renal mass protocol CT or MRI examination is recommended for definitive evaluation. 5. Slight interval increase in size of a 5.0 cm infrarenal abdominal aortic aneurysm. Recommend follow-up CT/MR every 6 months and vascular consultation. This recommendation follows ACR consensus guidelines: White Paper of the ACR Incidental Findings Committee II on Vascular Findings. J Am Coll Radiol 2013; 10:789-794. 6. 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 Aortic Atherosclerosis (ICD10-I70.0). Aortic aneurysm NOS (ICD10-I71.9). Electronically Signed   By: Helyn Numbers M.D.   On: 10/10/2022 20:46   CT Head Wo Contrast  Result Date: 10/10/2022 CLINICAL DATA:  Neck trauma EXAM: CT HEAD WITHOUT CONTRAST CT CERVICAL SPINE WITHOUT CONTRAST TECHNIQUE: Multidetector CT imaging of the head and cervical spine was  performed following the standard protocol without intravenous contrast. Multiplanar CT image reconstructions of the cervical spine were also generated. RADIATION DOSE REDUCTION: This exam was performed according to the departmental dose-optimization program which includes automated exposure control, adjustment of the mA and/or kV according to patient size and/or use of iterative reconstruction technique. COMPARISON:  CT C SPine 12/13/19 FINDINGS: CT HEAD FINDINGS Brain: No evidence of acute infarction, hemorrhage, hydrocephalus, extra-axial collection or mass lesion/mass effect. Sequela of moderate chronic microvascular ischemic change. Vascular: No hyperdense vessel or unexpected calcification. Skull: Normal. Negative for fracture or focal lesion. Sinuses/Orbits: No middle ear or mastoid effusion. Mucosal thickening right maxillary sinus and mild mucosal thickening in the bilateral ethmoid and left maxillary sinus. Bilateral lens replacement. Orbits are otherwise unremarkable. Other: None. CT CERVICAL SPINE FINDINGS Alignment: Normal. Skull base and vertebrae: No acute fracture. There are osseous findings of DISH with multiple bridging anterior osteophytes. There is a lytic lesion the base of the dens extending to the C2 vertebral body (series 8, image 26), unchanged from 2021. There is also an additional lucent lesion in the right occipital bone (series 5, image 6). Soft tissues and spinal canal: No prevertebral fluid or swelling. No visible canal hematoma. Disc levels:  No evidence of high-grade spinal canal stenosis. Upper chest: See separately dictated CT chest abdomen and pelvis for additional findings. Other: None IMPRESSION: 1. No acute intracranial abnormality. 2. No acute cervical spine fracture. Electronically Signed   By: Lorenza Cambridge M.D.   On: 10/10/2022 20:37   CT Cervical Spine Wo Contrast  Result Date: 10/10/2022 CLINICAL DATA:  Neck trauma EXAM: CT HEAD WITHOUT CONTRAST CT CERVICAL SPINE  WITHOUT CONTRAST TECHNIQUE: Multidetector CT imaging of the head and cervical spine was performed following the standard protocol without intravenous contrast. Multiplanar CT image reconstructions of the cervical spine were also generated. RADIATION DOSE REDUCTION: This exam was performed according to the departmental dose-optimization program which includes automated exposure control, adjustment  of the mA and/or kV according to patient size and/or use of iterative reconstruction technique. COMPARISON:  CT C SPine 12/13/19 FINDINGS: CT HEAD FINDINGS Brain: No evidence of acute infarction, hemorrhage, hydrocephalus, extra-axial collection or mass lesion/mass effect. Sequela of moderate chronic microvascular ischemic change. Vascular: No hyperdense vessel or unexpected calcification. Skull: Normal. Negative for fracture or focal lesion. Sinuses/Orbits: No middle ear or mastoid effusion. Mucosal thickening right maxillary sinus and mild mucosal thickening in the bilateral ethmoid and left maxillary sinus. Bilateral lens replacement. Orbits are otherwise unremarkable. Other: None. CT CERVICAL SPINE FINDINGS Alignment: Normal. Skull base and vertebrae: No acute fracture. There are osseous findings of DISH with multiple bridging anterior osteophytes. There is a lytic lesion the base of the dens extending to the C2 vertebral body (series 8, image 26), unchanged from 2021. There is also an additional lucent lesion in the right occipital bone (series 5, image 6). Soft tissues and spinal canal: No prevertebral fluid or swelling. No visible canal hematoma. Disc levels:  No evidence of high-grade spinal canal stenosis. Upper chest: See separately dictated CT chest abdomen and pelvis for additional findings. Other: None IMPRESSION: 1. No acute intracranial abnormality. 2. No acute cervical spine fracture. Electronically Signed   By: Lorenza Cambridge M.D.   On: 10/10/2022 20:37   CT T-SPINE NO CHARGE  Result Date:  10/10/2022 CLINICAL DATA:  Trip and fall injury. EXAM: CT Thoracic and Lumbar spine with contrast TECHNIQUE: Multiplanar CT images of the thoracic and lumbar spine were reconstructed from contemporary CT of the Chest, Abdomen, and Pelvis. RADIATION DOSE REDUCTION: This exam was performed according to the departmental dose-optimization program which includes automated exposure control, adjustment of the mA and/or kV according to patient size and/or use of iterative reconstruction technique. CONTRAST:  No additional IV contrast material was used. COMPARISON:  CT chest 06/16/2022 FINDINGS: CT THORACIC SPINE FINDINGS Alignment: Thoracic kyphosis without anterior subluxation. Vertebrae: No vertebral compression deformities. No focal bone lesion or bone destruction. Bridging anterior osteophyte or ligamentous calcification consistent with ankylosis. Paraspinal and other soft tissues: Soft tissue swelling and infiltration in the posterior paraspinal muscles in the midthoracic region towards the left likely representing soft tissue hematoma. Disc levels: Intervertebral disc spaces are preserved. CT LUMBAR SPINE FINDINGS Segmentation: 5 lumbar type vertebrae. Alignment: Normal alignment. Vertebrae: No vertebral compression deformities. No focal bone lesion or bone destruction. Bridging calcification anteriorly suggesting ankylosis. Ankylosis of the sacroiliac joints. Paraspinal and other soft tissues: Fatty atrophy of the posterior paraspinal muscles. No abnormal soft tissue swelling or infiltration. Disc levels: Intervertebral disc spaces are preserved. IMPRESSION: 1. Thoracic kyphosis. 2. 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 3. No acute displaced fractures are identified. Electronically Signed   By: Burman Nieves M.D.   On: 10/10/2022 20:35   CT L-SPINE NO CHARGE  Result Date: 10/10/2022 CLINICAL DATA:  Trip and fall injury. EXAM: CT Thoracic and Lumbar spine with contrast TECHNIQUE: Multiplanar CT  images of the thoracic and lumbar spine were reconstructed from contemporary CT of the Chest, Abdomen, and Pelvis. RADIATION DOSE REDUCTION: This exam was performed according to the departmental dose-optimization program which includes automated exposure control, adjustment of the mA and/or kV according to patient size and/or use of iterative reconstruction technique. CONTRAST:  No additional IV contrast material was used. COMPARISON:  CT chest 06/16/2022 FINDINGS: CT THORACIC SPINE FINDINGS Alignment: Thoracic kyphosis without anterior subluxation. Vertebrae: No vertebral compression deformities. No focal bone lesion or bone destruction. Bridging anterior osteophyte or ligamentous  calcification consistent with ankylosis. Paraspinal and other soft tissues: Soft tissue swelling and infiltration in the posterior paraspinal muscles in the midthoracic region towards the left likely representing soft tissue hematoma. Disc levels: Intervertebral disc spaces are preserved. CT LUMBAR SPINE FINDINGS Segmentation: 5 lumbar type vertebrae. Alignment: Normal alignment. Vertebrae: No vertebral compression deformities. No focal bone lesion or bone destruction. Bridging calcification anteriorly suggesting ankylosis. Ankylosis of the sacroiliac joints. Paraspinal and other soft tissues: Fatty atrophy of the posterior paraspinal muscles. No abnormal soft tissue swelling or infiltration. Disc levels: Intervertebral disc spaces are preserved. IMPRESSION: 1. Thoracic kyphosis. 2. 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 3. No acute displaced fractures are identified. Electronically Signed   By: Burman Nieves M.D.   On: 10/10/2022 20:35    Microbiology: Results for orders placed or performed during the hospital encounter of 10/17/22  Resp panel by RT-PCR (RSV, Flu A&B, Covid) Anterior Nasal Swab     Status: Abnormal   Collection Time: 10/17/22  2:27 AM   Specimen: Anterior Nasal Swab  Result Value Ref Range Status    SARS Coronavirus 2 by RT PCR POSITIVE (A) NEGATIVE Final   Influenza A by PCR NEGATIVE NEGATIVE Final   Influenza B by PCR NEGATIVE NEGATIVE Final    Comment: (NOTE) The Xpert Xpress SARS-CoV-2/FLU/RSV plus assay is intended as an aid in the diagnosis of influenza from Nasopharyngeal swab specimens and should not be used as a sole basis for treatment. Nasal washings and aspirates are unacceptable for Xpert Xpress SARS-CoV-2/FLU/RSV testing.  Fact Sheet for Patients: BloggerCourse.com  Fact Sheet for Healthcare Providers: SeriousBroker.it  This test is not yet approved or cleared by the [place] FDA and has been authorized for detection and/or diagnosis of SARS-CoV-2 by FDA under an Emergency Use Authorization (EUA). This EUA will remain in effect (meaning this test can be used) for the duration of the COVID-19 declaration under Section 564(b)(1) of the Act, 21 U.S.C. section 360bbb-3(b)(1), unless the authorization is terminated or revoked.     Resp Syncytial Virus by PCR NEGATIVE NEGATIVE Final    Comment: (NOTE) Fact Sheet for Patients: BloggerCourse.com  Fact Sheet for Healthcare Providers: SeriousBroker.it  This test is not yet approved or cleared by the [place] FDA and has been authorized for detection and/or diagnosis of SARS-CoV-2 by FDA under an Emergency Use Authorization (EUA). This EUA will remain in effect (meaning this test can be used) for the duration of the COVID-19 declaration under Section 564(b)(1) of the Act, 21 U.S.C. section 360bbb-3(b)(1), unless the authorization is terminated or revoked.  Performed at [facility], [address], [place], [place] [address]   Blood Culture (routine x 2)     Status: Abnormal   Collection Time: 10/17/22  2:33 AM   Specimen: BLOOD RIGHT ARM  Result Value Ref Range Status   Specimen Description  BLOOD RIGHT ARM  Final   Special Requests   Final    BOTTLES DRAWN AEROBIC AND ANAEROBIC Blood Culture adequate volume   Culture  Setup Time   Final    GRAM POSITIVE COCCI IN CLUSTERS IN BOTH AEROBIC AND ANAEROBIC BOTTLES CRITICAL RESULT CALLED TO, READ BACK BY AND VERIFIED WITH: PHARMD T. DANG [id number] @2046  FH    Culture (A)  Final    STAPHYLOCOCCUS HOMINIS THE SIGNIFICANCE OF ISOLATING THIS ORGANISM FROM A SINGLE SET OF BLOOD CULTURES WHEN MULTIPLE SETS ARE DRAWN IS UNCERTAIN. PLEASE NOTIFY THE MICROBIOLOGY DEPARTMENT WITHIN ONE WEEK IF SPECIATION AND  SENSITIVITIES ARE REQUIRED. Performed at [facility], [address] [place], [place] [address]    Report Status 10/19/2022 FINAL  Final  Blood Culture (routine x 2)     Status: Abnormal   Collection Time: 10/17/22  2:33 AM   Specimen: BLOOD LEFT ARM  Result Value Ref Range Status   Specimen Description BLOOD LEFT ARM  Final   Special Requests   Final    BOTTLES DRAWN AEROBIC AND ANAEROBIC Blood Culture adequate volume   Culture  Setup Time   Final    GRAM POSITIVE COCCI ANAEROBIC BOTTLE ONLY CRITICAL VALUE NOTED.  VALUE IS CONSISTENT WITH PREVIOUSLY REPORTED AND CALLED VALUE.    Culture (A)  Final    STAPHYLOCOCCUS EPIDERMIDIS THE SIGNIFICANCE OF ISOLATING THIS ORGANISM FROM A SINGLE SET OF BLOOD CULTURES WHEN MULTIPLE SETS ARE DRAWN IS UNCERTAIN. PLEASE NOTIFY THE MICROBIOLOGY DEPARTMENT WITHIN ONE WEEK IF SPECIATION AND SENSITIVITIES ARE REQUIRED. Performed at [facility], [address], [place], [place] [address]    Report Status 10/19/2022 FINAL  Final  Blood Culture ID Panel (Reflexed)     Status: Abnormal   Collection Time: 10/17/22  2:33 AM  Result Value Ref Range Status   Enterococcus faecalis NOT DETECTED NOT DETECTED Final   Enterococcus Faecium NOT DETECTED NOT DETECTED Final   Listeria monocytogenes NOT DETECTED NOT DETECTED Final   Staphylococcus species DETECTED (A) NOT DETECTED Final    Comment:  CRITICAL RESULT CALLED TO, READ BACK BY AND VERIFIED WITH: PHARMD T. DANG [id number] @2046  FH    Staphylococcus aureus (BCID) NOT DETECTED NOT DETECTED Final   Staphylococcus epidermidis NOT DETECTED NOT DETECTED Final   Staphylococcus lugdunensis NOT DETECTED NOT DETECTED Final   Streptococcus species NOT DETECTED NOT DETECTED Final   Streptococcus agalactiae NOT DETECTED NOT DETECTED Final   Streptococcus pneumoniae NOT DETECTED NOT DETECTED Final   Streptococcus pyogenes NOT DETECTED NOT DETECTED Final   A.calcoaceticus-baumannii NOT DETECTED NOT DETECTED Final   Bacteroides fragilis NOT DETECTED NOT DETECTED Final   Enterobacterales NOT DETECTED NOT DETECTED Final   Enterobacter cloacae complex NOT DETECTED NOT DETECTED Final   Escherichia coli NOT DETECTED NOT DETECTED Final   Klebsiella aerogenes NOT DETECTED NOT DETECTED Final   Klebsiella oxytoca NOT DETECTED NOT DETECTED Final   Klebsiella pneumoniae NOT DETECTED NOT DETECTED Final   Proteus species NOT DETECTED NOT DETECTED Final   Salmonella species NOT DETECTED NOT DETECTED Final   Serratia marcescens NOT DETECTED NOT DETECTED Final   Haemophilus influenzae NOT DETECTED NOT DETECTED Final   Neisseria meningitidis NOT DETECTED NOT DETECTED Final   Pseudomonas aeruginosa NOT DETECTED NOT DETECTED Final   Stenotrophomonas maltophilia NOT DETECTED NOT DETECTED Final   Candida albicans NOT DETECTED NOT DETECTED Final   Candida auris NOT DETECTED NOT DETECTED Final   Candida glabrata NOT DETECTED NOT DETECTED Final   Candida krusei NOT DETECTED NOT DETECTED Final   Candida parapsilosis NOT DETECTED NOT DETECTED Final   Candida tropicalis NOT DETECTED NOT DETECTED Final   Cryptococcus neoformans/gattii NOT DETECTED NOT DETECTED Final    Comment: Performed at [facility], [address] [place], [place] [address]    Discharge time spent: 35 minutes.  Signed: Jacquelin Hawking, MD [facility] 10/30/2022

## 2022-10-30 NOTE — Telephone Encounter (Signed)
   CCM RN Visit Note   10-30-2022 Name: [name] MRN: [id number]      DOB: [birth year]  Subjective: Aaron Gibbs is a 88 y.o. year old male who is a primary care patient of Smitty Cords, DO. The patient was referred to the Chronic Care Management team for assistance with care management needs subsequent to provider initiation of CCM services and plan of care.      Today's Visit:  Spoke to Henderson, his caregiver and DPR  for follow up visit.     The patient is currenlty inpatient at [facility] and will be going to rehab, likely today. Will continue to monitor and follow up accordingly.    Plan:The care management team will reach out to the patient again over the next 30 days.  Alto Denver RN, MSN, CCM RN Care Manager  Chronic Care Management Direct Number: [phone]

## 2022-10-30 NOTE — TOC Progression Note (Addendum)
Transition of Care [facility]) - Progression Note    Patient Details  Name: Aaron Gibbs MRN: [id number] Date of Birth: [birth year]  Transition of Care [facility]) CM/SW Contact  Lorri Frederick, LCSW Phone Number: 10/30/2022, 10:33 AM  Clinical Narrative:   SNF auth approved: [id number], [id number].  4 days: 5/11-5/14.  MD notified.  1130: [facility].  They are aware pt has not had BM, pt is having limited intake, OK to receive pt today.  MD informed.  Expected Discharge Plan: Skilled Nursing Facility Barriers to Discharge: Continued Medical Work up, English as a second language teacher, SNF Pending bed offer  Expected Discharge Plan and Services In-house Referral: Clinical Social Work     Living arrangements for the past 2 months: Single Family Home                                       Social Determinants of Health (SDOH) Interventions SDOH Screenings   Food Insecurity: No Food Insecurity (10/11/2022)  Housing: Low Risk  (10/11/2022)  Transportation Needs: No Transportation Needs (10/11/2022)  Utilities: Not At Risk (10/11/2022)  Alcohol Screen: Low Risk  (07/13/2022)  Depression (PHQ2-9): Low Risk  (09/28/2022)  Recent Concern: Depression (PHQ2-9) - High Risk (09/13/2022)  Financial Resource Strain: Low Risk  (07/13/2022)  Physical Activity: Inactive (07/13/2022)  Social Connections: Socially Isolated (07/13/2022)  Stress: Stress Concern Present (07/13/2022)  Tobacco Use: High Risk (10/24/2022)    Readmission Risk Interventions     No data to display

## 2022-11-02 ENCOUNTER — Non-Acute Institutional Stay: Payer: Medicare Other | Admitting: Hospice

## 2022-11-02 DIAGNOSIS — R531 Weakness: Secondary | ICD-10-CM

## 2022-11-02 DIAGNOSIS — G894 Chronic pain syndrome: Secondary | ICD-10-CM

## 2022-11-02 DIAGNOSIS — K5901 Slow transit constipation: Secondary | ICD-10-CM

## 2022-11-02 DIAGNOSIS — Z515 Encounter for palliative care: Secondary | ICD-10-CM

## 2022-11-02 NOTE — Progress Notes (Signed)
Therapist, nutritional Palliative Care Consult Note Telephone: [phone]  Fax: [phone]  PATIENT NAME: Aaron Gibbs [address] [place] [address] [phone] (home)  DOB: 16-Dec-1934 MRN: [id number]  PRIMARY CARE PROVIDER:    Smitty Cords, DO,  [address] [place] [address] [phone]  REFERRING PROVIDER:   [place] Miller NP  RESPONSIBLE PARTY:    Contact Information     Name Relation Home Work Mobile   [place] Daughter [phone]  [phone]   Rosario,Charlie Other   [phone]        I met face to face with patient in the facility. Visit to build trust and highlight Palliative Medicine as specialized medical care for people living with serious illness, aimed at facilitating better quality of life through symptoms relief, assisting with advance care planning and complex medical decision making. Arline Asp is with patient during visit. NP called [name] and updated on visit; she expressed appreciation.  Visit consisted of counseling and education dealing with the complex and emotionally intense issues of symptom management and palliative care in the setting of serious and potentially life-threatening illness. Palliative care team will continue to support patient, patient's family, and medical team.  ASSESSMENT AND / RECOMMENDATIONS:  ------------------------------------------------------------------------------------------------------------------------------------------------- Advance Care Planning: Our advance care planning conversation included a discussion about:    The value and importance of advance care planning  Difference between Hospice and Palliative care Exploration of goals of care in the event of a sudden injury or illness  Identification and preparation of a healthcare agent  Review and updating or creation of an  advance directive document . Decision not to resuscitate or to de-escalate disease focused treatments  due to poor prognosis.  CODE STATUS: Do Not Resuscitate  Goals of Care: Goals include to maximize quality of life and symptom management  I spent 16 minutes providing this initial consultation. More than 50% of the time in this consultation was spent on counseling patient and coordinating communication. --------------------------------------------------------------------------------------------------------------------------------------  Symptom Management/Plan: Generalized weakness: worsened with recent hospitalization 4/30-5/13/24 for acute kidney injury, fall at home with subsequent C2 cervical spine fracture.  Responded well to IV fluids, soft collar for stabilization of neck x 6 weeks, pain neurosurgery consult.  Continue PT/OT for strengthening and mobility.  Major depressive disorder: Managed with olanzapine, Duloxetin.  Encourage socialization and participation in facility activities.  Psych consult as needed.  Chronic pain syndrom: Currently on buprenorphine patch, Oxycodone, Tylenol, Gabapentin.  Monitor closely for unalleviated pain, respiratory depression.  Plan to wean off narcotics weighing benefits versus risk of Decompensation.  Routine CBC CMP  Constipation: Continue Miralax as ordered. Follow up: Palliative care will continue to follow for complex medical decision making, advance care planning, and clarification of goals. Return 6 weeks or prn. Encouraged to call provider sooner with any concerns.   Family /Caregiver/Community Supports: Patient in SNF for ongoing care  HOSPICE ELIGIBILITY/DIAGNOSIS: TBD  Chief Complaint: Initial Palliative care visit  HISTORY OF PRESENT ILLNESS:  Aaron Gibbs is a 88 y.o. year old male  with multiple morbidities requiring close monitoring/management, and with high risk of complications and  mortality: Generalized weakness worsened with recent hospitalization, fall, C2 neck fracture, history of blindness, melanoma in left arm, chronic  pain syndrome, COPD, depression, GERD, hypertension.    History obtained from review of EMR, discussion with primary team, caregiver, family and/or Mr. Chadick.  Review and summarization of Epic records shows history from other than patient. Rest of 10 point ROS asked and  negative. Independent interpretation of tests and reviewed as needed, available labs, patient records, imaging, studies and related documents from the EMR.  PHYSICAL EXAM: GEN: in no acute distress, soft neck collar in place, blind. Cardiac: S1 S2, no LE edema feet/ankles  Respiratory:  clear to auscultation bilaterally GI: soft, nontender,  + BS   MS: Moving all extremities, nonambulatory Skin: warm and dry, no rash to visible skin Neuro: Generalized weakness, nonfocal Psych: non-anxious affect   PAST MEDICAL HISTORY:  Active Ambulatory Problems    Diagnosis Date Noted   Chronic pain syndrome 05/01/2018   DISH (diffuse idiopathic skeletal hyperostosis) 05/01/2018   Osteoarthritis of multiple joints 05/01/2018   Chronic knee pain (1ry area of Pain) (Bilateral) (L>R) 05/01/2018   Retinitis pigmentosa of both eyes 05/01/2018   Glaucoma of both eyes 05/01/2018   Essential hypertension 05/01/2018   Chronic low back pain (Bilateral) w/o sciatica 05/01/2018   GERD (gastroesophageal reflux disease) 05/01/2018   AVM (arteriovenous malformation) of colon 05/01/2018   Therapeutic opioid-induced constipation (OIC) 05/01/2018   Blindness 05/10/2018   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   Major depressive disorder, recurrent episode, severe (HCC) 06/04/2018   Long term current use of opiate analgesic 07/10/2018   Pharmacologic therapy 07/10/2018   Disorder of skeletal system 07/10/2018   Problems influencing health status 07/10/2018   Atherosclerotic peripheral vascular disease (HCC) 07/24/2018   Tricompartment osteoarthritis of knee (Left) 07/24/2018   Osteoarthritis of knee (Bilateral) 07/24/2018   Osteoarthritis of  patellofemoral joint (Right) 07/24/2018   History of suicide attempt (06/12/18) 07/24/2018   Neurogenic pain 08/20/2018   Noncompliance with medication treatment due to overuse of medication 10/23/2018   Preop testing 11/14/2018   Palpitation 02/13/2019   Lateral meniscal tear, sequela (Left) 03/03/2019   Medial meniscal tear, sequela (Left) 06/10/2019   Patellar tendinosis (Right) 06/10/2019   COPD (chronic obstructive pulmonary disease) (HCC)    HLD (hyperlipidemia)    Iron deficiency anemia    Fall at home, initial encounter    Depression with anxiety 12/13/2019   Depression 12/12/2019   Generalized anxiety disorder with panic attacks 02/04/2020   Aortic atherosclerosis (HCC) 02/05/2020   Osteoarthritis of knee (Left) 03/23/2020   History of substance use disorder 04/15/2020   Opioid dependence with or without physiological dependence (HCC) 08/08/2020   Long-term current use of benzodiazepine 08/09/2020   Opioid dependence, binge pattern (HCC) 08/09/2020   Abnormal MRI, knee (08/05/2018) (Left) 09/13/2020   Chronic knee pain (Left) 09/28/2020   Weakness 01/29/2022   Benign paroxysmal positional vertigo    Sensorineural hearing loss (SNHL) of both ears 07/25/2021   Pain in right knee 03/15/2021   Other chronic pain 03/15/2021   Unable to maintain body in lying position 05/24/2022   Other pancytopenia (HCC) 06/09/2022   Palliative care patient 07/17/2022   Chronic heart failure with preserved ejection fraction (HCC) 09/13/2022   COVID-19 10/11/2022   Thrombocytopenia (HCC) 10/11/2022   DNR (do not resuscitate) 10/17/2022   AKI (acute kidney injury) (HCC) 10/17/2022   C2 cervical fracture (HCC) 10/17/2022   Infrarenal abdominal aortic aneurysm (AAA) without rupture (HCC) 10/17/2022   Hypokalemia 10/17/2022   Resolved Ambulatory Problems    Diagnosis Date Noted   Centrilobular emphysema (HCC) 05/01/2018   Suicidal ideation 05/10/2018   Acute metabolic encephalopathy  06/04/2018   Suicide attempt (HCC) (05/13/18) 06/26/2018   Community acquired pneumonia 03/29/2019   Acute respiratory failure with hypoxia (HCC) 04/09/2019   Acute exacerbation of chronic obstructive pulmonary disease (COPD) (HCC)  04/09/2019   Acute postoperative pain 06/10/2019   Aspiration pneumonia (HCC) 09/25/2019   AKI (acute kidney injury) (HCC) 12/13/2019   Acute kidney failure (HCC) 12/14/2019   Altered mental status 01/29/2022   Abdominal pain 09/08/2021   COPD exacerbation (HCC) 10/12/2022   Past Medical History:  Diagnosis Date   Anxiety    Cancer (HCC)    Glaucoma    Osteoporosis     SOCIAL HX:  Social History   Tobacco Use   Smoking status: Former    Years: 20    Types: Cigarettes   Smokeless tobacco: Current    Types: Chew   Tobacco comments:    stopped 15 years ago  Substance Use Topics   Alcohol use: Not Currently    Comment: past     FAMILY HX:  Family History  Problem Relation Age of Onset   Depression Mother    Heart disease Father       ALLERGIES:  Allergies  Allergen Reactions   Anthralin Shortness Of Breath   Azithromycin Shortness Of Breath      PERTINENT MEDICATIONS:  Outpatient Encounter Medications as of 11/02/2022  Medication Sig   acetaminophen (TYLENOL) 500 MG tablet Take 1,000 mg by mouth in the morning, at noon, and at bedtime.   bumetanide (BUMEX) 1 MG tablet Take 1/2 (one-half) tablet by mouth twice daily (Patient taking differently: Take 0.5 mg by mouth 2 (two) times daily.)   [START ON 11/29/2022] buprenorphine (BUTRANS) 10 MCG/HR PTWK Place 1 patch onto the skin once a week for 28 days. Apply only 1 patch at a time and alternate sites weekly.   buprenorphine (BUTRANS) 10 MCG/HR PTWK Place 1 patch onto the skin once a week. Apply only 1 patch at a time and alternate sites weekly.   carvedilol (COREG) 12.5 MG tablet Take 1 tablet by mouth twice daily   COSOPT 22.3-6.8 MG/ML ophthalmic solution Place 1 drop into both eyes 2  (two) times daily.   diclofenac Sodium (VOLTAREN) 1 % GEL Apply 2 g topically at bedtime. Apply to both knees twice daily (Patient taking differently: Apply 2 g topically at bedtime. Apply to both knees at night)   DULoxetine (CYMBALTA) 30 MG capsule Take 3 capsules (90 mg total) by mouth daily.   feeding supplement (ENSURE ENLIVE / ENSURE PLUS) LIQD Take 237 mLs by mouth 4 (four) times daily -  before meals and at bedtime.   finasteride (PROSCAR) 5 MG tablet Take 1 tablet (5 mg total) by mouth daily.   gabapentin (NEURONTIN) 300 MG capsule Take 1 capsule (300 mg total) by mouth daily. (Patient taking differently: Take 300 mg by mouth at bedtime.)   Iron, Ferrous Sulfate, 325 (65 Fe) MG TABS Take 325 mg by mouth daily with supper.   latanoprost (XALATAN) 0.005 % ophthalmic solution Place 1 drop into both eyes at bedtime.   Melatonin 5 MG CAPS Take 1 capsule (5 mg total) by mouth at bedtime. (Patient taking differently: Take 10 mg by mouth at bedtime.)   meloxicam (MOBIC) 15 MG tablet Take 15 mg by mouth daily.   OLANZapine (ZYPREXA) 2.5 MG tablet Take 1 tablet by mouth twice daily   oxyCODONE 10 MG TABS Take 0.5-1 tablets (5-10 mg total) by mouth every 4 (four) hours as needed for severe pain or moderate pain.   pantoprazole (PROTONIX) 40 MG tablet Take 1 tablet (40 mg total) by mouth daily.   polyethylene glycol (MIRALAX / GLYCOLAX) 17 g packet Take 17 g by  mouth 2 (two) times daily.   tamsulosin (FLOMAX) 0.4 MG CAPS capsule Take 1 capsule by mouth once daily   traZODone (DESYREL) 150 MG tablet TAKE 1 TABLET BY MOUTH AT BEDTIME AS NEEDED (Patient taking differently: Take 150 mg by mouth at bedtime.)   No facility-administered encounter medications on file as of 11/02/2022.     Thank you for the opportunity to participate in the care of Mr. Cavell.  The palliative care team will continue to follow. Please call our office at [phone] if we can be of additional assistance.   Note: Portions  of this note were generated with Scientist, clinical (histocompatibility and immunogenetics). Dictation errors may occur despite best attempts at proofreading.  Rosaura Carpenter, NP

## 2022-11-09 DIAGNOSIS — C439 Malignant melanoma of skin, unspecified: Secondary | ICD-10-CM | POA: Insufficient documentation

## 2022-11-09 DIAGNOSIS — J449 Chronic obstructive pulmonary disease, unspecified: Secondary | ICD-10-CM | POA: Diagnosis present

## 2022-11-15 ENCOUNTER — Other Ambulatory Visit: Payer: Medicare Other

## 2022-11-16 ENCOUNTER — Ambulatory Visit: Payer: Medicare Other | Admitting: Pain Medicine

## 2022-11-18 ENCOUNTER — Emergency Department (HOSPITAL_COMMUNITY): Payer: Medicare Other

## 2022-11-18 ENCOUNTER — Emergency Department (HOSPITAL_COMMUNITY)
Admission: EM | Admit: 2022-11-18 | Discharge: 2022-11-19 | Disposition: A | Discharge status: Home / Self Care | Payer: Medicare Other | Attending: Emergency Medicine | Admitting: Emergency Medicine

## 2022-11-18 ENCOUNTER — Other Ambulatory Visit: Payer: Self-pay

## 2022-11-18 ENCOUNTER — Encounter (HOSPITAL_COMMUNITY): Payer: Self-pay

## 2022-11-18 DIAGNOSIS — S0083XA Contusion of other part of head, initial encounter: Secondary | ICD-10-CM | POA: Diagnosis not present

## 2022-11-18 DIAGNOSIS — W06XXXA Fall from bed, initial encounter: Secondary | ICD-10-CM | POA: Insufficient documentation

## 2022-11-18 DIAGNOSIS — R519 Headache, unspecified: Secondary | ICD-10-CM | POA: Diagnosis present

## 2022-11-18 MED ORDER — MORPHINE SULFATE (PF) 4 MG/ML IV SOLN
4.0000 mg | Freq: Once | INTRAVENOUS | Status: AC
  Administered 2022-11-18: 4 mg via INTRAVENOUS
  Filled 2022-11-18: qty 1

## 2022-11-18 MED ORDER — ONDANSETRON HCL 4 MG/2ML IJ SOLN
4.0000 mg | Freq: Once | INTRAMUSCULAR | Status: AC
  Administered 2022-11-18: 4 mg via INTRAVENOUS
  Filled 2022-11-18: qty 2

## 2022-11-18 NOTE — ED Provider Notes (Signed)
[place] EMERGENCY DEPARTMENT AT [facility] Provider Note   CSN: [id number] Arrival date & time: 11/18/22  2053     History  Chief Complaint  Patient presents with   Aaron Gibbs is a 88 y.o. male.  88 yo M with a chief complaint of falling out of bed.  He tells me that he fell out of bed and then felt like he was very forcibly put back into bed and he thinks that he broke maybe his collarbone and she thinks maybe he rebroke his neck.  He also is complaining of a headache.  He denies any back pain denies any leg pain denies arm pain.  Denies abdominal pain.   Fall       Home Medications Prior to Admission medications   Medication Sig Start Date End Date Taking? Authorizing Provider  acetaminophen (TYLENOL) 500 MG tablet Take 1,000 mg by mouth in the morning, at noon, and at bedtime.    [provider]  bumetanide (BUMEX) 1 MG tablet Take 1/2 (one-half) tablet by mouth twice daily Patient taking differently: Take 0.5 mg by mouth 2 (two) times daily. 09/20/22   Karamalegos, Netta Neat, DO  buprenorphine (BUTRANS) 10 MCG/HR PTWK Place 1 patch onto the skin once a week for 28 days. Apply only 1 patch at a time and alternate sites weekly. 11/29/22 12/27/22  Delano Metz, MD  buprenorphine (BUTRANS) 10 MCG/HR PTWK Place 1 patch onto the skin once a week. Apply only 1 patch at a time and alternate sites weekly. 11/01/22   Narda Bonds, MD  carvedilol (COREG) 12.5 MG tablet Take 1 tablet by mouth twice daily 09/14/22   Karamalegos, Netta Neat, DO  COSOPT 22.3-6.8 MG/ML ophthalmic solution Place 1 drop into both eyes 2 (two) times daily. 06/25/20   [provider]  diclofenac Sodium (VOLTAREN) 1 % GEL Apply 2 g topically at bedtime. Apply to both knees twice daily Patient taking differently: Apply 2 g topically at bedtime. Apply to both knees at night 02/10/21 10/17/23  Karamalegos, Netta Neat, DO  DULoxetine (CYMBALTA) 30 MG capsule Take 3  capsules (90 mg total) by mouth daily. 05/08/22   Karamalegos, Netta Neat, DO  feeding supplement (ENSURE ENLIVE / ENSURE PLUS) LIQD Take 237 mLs by mouth 4 (four) times daily -  before meals and at bedtime. 10/30/22   Narda Bonds, MD  finasteride (PROSCAR) 5 MG tablet Take 1 tablet (5 mg total) by mouth daily. 04/24/22   Karamalegos, Netta Neat, DO  gabapentin (NEURONTIN) 300 MG capsule Take 1 capsule (300 mg total) by mouth daily. Patient taking differently: Take 300 mg by mouth at bedtime. 06/06/22   Karamalegos, Netta Neat, DO  Iron, Ferrous Sulfate, 325 (65 Fe) MG TABS Take 325 mg by mouth daily with supper. 06/06/22   Karamalegos, Netta Neat, DO  latanoprost (XALATAN) 0.005 % ophthalmic solution Place 1 drop into both eyes at bedtime. 02/28/22   [provider]  Melatonin 5 MG CAPS Take 1 capsule (5 mg total) by mouth at bedtime. Patient taking differently: Take 10 mg by mouth at bedtime. 02/03/22   Karamalegos, Netta Neat, DO  meloxicam (MOBIC) 15 MG tablet Take 15 mg by mouth daily. 06/14/22   [provider]  OLANZapine (ZYPREXA) 2.5 MG tablet Take 1 tablet by mouth twice daily 09/29/22   Lorre Munroe, NP  oxyCODONE 10 MG TABS Take 0.5-1 tablets (5-10 mg total) by mouth every 4 (four) hours as  needed for severe pain or moderate pain. 10/30/22   Narda Bonds, MD  pantoprazole (PROTONIX) 40 MG tablet Take 1 tablet (40 mg total) by mouth daily. 05/01/22   Karamalegos, Netta Neat, DO  polyethylene glycol (MIRALAX / GLYCOLAX) 17 g packet Take 17 g by mouth 2 (two) times daily. 10/30/22   Narda Bonds, MD  tamsulosin (FLOMAX) 0.4 MG CAPS capsule Take 1 capsule by mouth once daily 09/21/22   Karamalegos, Netta Neat, DO  traZODone (DESYREL) 150 MG tablet TAKE 1 TABLET BY MOUTH AT BEDTIME AS NEEDED Patient taking differently: Take 150 mg by mouth at bedtime. 09/01/22   Karamalegos, Netta Neat, DO      Allergies    Anthralin and Azithromycin    Review of Systems    Review of Systems  Physical Exam Updated Vital Signs BP 126/73 (BP Location: Right Arm)   Pulse 88   Temp 98.1 F (36.7 C) (Oral)   Resp 17   Ht 5\' 11"  (1.803 m)   Wt 95.3 kg   SpO2 99%   BMI 29.29 kg/m  Physical Exam Vitals and nursing note reviewed.  Constitutional:      Appearance: He is well-developed.  HENT:     Head: Normocephalic and atraumatic.     Comments: Oral appearing left frontal hematoma.  Old appearing cervical collar. Eyes:     Pupils: Pupils are equal, round, and reactive to light.  Neck:     Vascular: No JVD.  Cardiovascular:     Rate and Rhythm: Normal rate and regular rhythm.     Heart sounds: No murmur heard.    No friction rub. No gallop.  Pulmonary:     Effort: No respiratory distress.     Breath sounds: No wheezing.  Abdominal:     General: There is no distension.     Tenderness: There is no abdominal tenderness. There is no guarding or rebound.  Musculoskeletal:        General: Normal range of motion.     Cervical back: Normal range of motion and neck supple.     Comments: Palpated from head to toe without any other noted areas of bony tenderness  Skin:    Coloration: Skin is not pale.     Findings: No rash.  Neurological:     Mental Status: He is alert and oriented to person, place, and time.  Psychiatric:        Behavior: Behavior normal.     ED Results / Procedures / Treatments   Labs (all labs ordered are listed, but only abnormal results are displayed) Labs Reviewed - No data to display  EKG None  Radiology CT Cervical Spine Wo Contrast  Result Date: 11/18/2022 CLINICAL DATA:  Neck trauma (Age >= 65y) Recent C2 fracture. EXAM: CT CERVICAL SPINE WITHOUT CONTRAST TECHNIQUE: Multidetector CT imaging of the cervical spine was performed without intravenous contrast. Multiplanar CT image reconstructions were also generated. RADIATION DOSE REDUCTION: This exam was performed according to the departmental dose-optimization program  which includes automated exposure control, adjustment of the mA and/or kV according to patient size and/or use of iterative reconstruction technique. COMPARISON:  CT 10/17/2022 FINDINGS: Alignment: Exaggerated cervical kyphosis and associated straightening of cervical lordosis. Skull base and vertebrae: Mildly displaced type 2 dens fracture extending to the right lateral mass. No significant change from prior exam. There is no solid bony bridging. There may be some peripheral callus formation. No new or additional fracture. Diffuse flowing anterior osteophytes throughout the  cervical and included upper thoracic spine. Soft tissues and spinal canal: No definite canal hematoma related to C2 fracture. Mild thickening of the prevertebral soft tissues at the level of fracture. Disc levels: Stable multilevel degenerative change with diffuse flowing anterior osteophytes and facet ankylosis. Upper chest: No acute findings. Other: None. IMPRESSION: 1. Mildly displaced type 2 dens fracture extending to the right lateral mass without significant change from prior exam. There is no solid bony bridging. There may be some peripheral callus formation. 2. No new fracture. 3. Diffuse flowing anterior osteophytes and facet ankylosis. Electronically Signed   By: Narda Rutherford M.D.   On: 11/18/2022 23:34   CT Head Wo Contrast  Result Date: 11/18/2022 CLINICAL DATA:  Head trauma, minor (Age >= 65y) Recent cervical spine fracture. EXAM: CT HEAD WITHOUT CONTRAST TECHNIQUE: Contiguous axial images were obtained from the base of the skull through the vertex without intravenous contrast. RADIATION DOSE REDUCTION: This exam was performed according to the departmental dose-optimization program which includes automated exposure control, adjustment of the mA and/or kV according to patient size and/or use of iterative reconstruction technique. COMPARISON:  Head CT 10/17/2022 FINDINGS: Brain: No intracranial hemorrhage, mass effect, or  midline shift. Age related atrophy. No hydrocephalus. The basilar cisterns are patent. Stable degree of chronic small vessel ischemia. No evidence of territorial infarct or acute ischemia. No extra-axial or intracranial fluid collection. Vascular: Atherosclerosis of skullbase vasculature without hyperdense vessel or abnormal calcification. Skull: No fracture or focal lesion. Sinuses/Orbits: Chronic mucosal thickening of the paranasal sinuses. No acute findings. No mastoid effusion. Other: None. IMPRESSION: 1. No acute intracranial abnormality. No skull fracture. 2. Age related atrophy and chronic small vessel ischemia. Electronically Signed   By: Narda Rutherford M.D.   On: 11/18/2022 23:28    Procedures Procedures    Medications Ordered in ED Medications  morphine (PF) 4 MG/ML injection 4 mg (4 mg Intravenous Given 11/18/22 2134)  ondansetron (ZOFRAN) injection 4 mg (4 mg Intravenous Given 11/18/22 2133)  morphine (PF) 4 MG/ML injection 4 mg (4 mg Intravenous Given 11/18/22 2226)  morphine (PF) 4 MG/ML injection 4 mg (4 mg Intravenous Given 11/18/22 2256)    ED Course/ Medical Decision Making/ A&P                             Medical Decision Making Amount and/or Complexity of Data Reviewed Radiology: ordered.  Risk Prescription drug management.   88 yo M with a chief complaints of a fall out of bed.  Patient has a known C2 fracture.  He thinks maybe that got worse.  Also tells me that he knows he has broken his collarbones bilaterally and he thinks maybe that is worse.  He wants pain medicine and asked for it a few times during our encounter.  Will obtain a CT of the head and C-spine.  Plain film of the chest.  Old records reviewed the patient does have a known C2 fracture.  CT of the head without obvious intracranial hemorrhage.  No change on my independent interpretation.  CXR independently interpreted by me without obvious fracture or pneumothorax.  11:39 PM:  I have discussed the  diagnosis/risks/treatment options with the patient.  Evaluation and diagnostic testing in the emergency department does not suggest an emergent condition requiring admission or immediate intervention beyond what has been performed at this time.  They will follow up with PCP. We also discussed returning to the ED immediately if  new or worsening sx occur. We discussed the sx which are most concerning (e.g., sudden worsening pain, fever, inability to tolerate by mouth) that necessitate immediate return. Medications administered to the patient during their visit and any new prescriptions provided to the patient are listed below.  Medications given during this visit Medications  morphine (PF) 4 MG/ML injection 4 mg (4 mg Intravenous Given 11/18/22 2134)  ondansetron (ZOFRAN) injection 4 mg (4 mg Intravenous Given 11/18/22 2133)  morphine (PF) 4 MG/ML injection 4 mg (4 mg Intravenous Given 11/18/22 2226)  morphine (PF) 4 MG/ML injection 4 mg (4 mg Intravenous Given 11/18/22 2256)     The patient appears reasonably screen and/or stabilized for discharge and I doubt any other medical condition or other [facility] requiring further screening, evaluation, or treatment in the ED at this time prior to discharge.         Final Clinical Impression(s) / ED Diagnoses Final diagnoses:  Fall from bed, initial encounter    Rx / DC Orders ED Discharge Orders     None         Melene Plan, DO 11/18/22 2339

## 2022-11-18 NOTE — ED Triage Notes (Signed)
EMS reports:  Patient came from [facility]. Patient fall out of bed- ground level x2. Patient received Oxycodone and tylenol.  Head injury hx C-2 fracture.  BP 114/54 HR 85 O2 94

## 2022-11-18 NOTE — Discharge Instructions (Addendum)
Follow up with your doctor in the office.  Return for worsening symptoms. You have a new spot on your lung.  Your PCP might consider doing a CT scan in the future to look at this better.

## 2022-11-19 NOTE — ED Notes (Signed)
Ptar called for transport 

## 2022-11-29 ENCOUNTER — Ambulatory Visit: Payer: Self-pay

## 2022-11-29 ENCOUNTER — Telehealth: Payer: Medicare Other

## 2022-11-29 DIAGNOSIS — F410 Panic disorder [episodic paroxysmal anxiety] without agoraphobia: Secondary | ICD-10-CM

## 2022-11-29 DIAGNOSIS — J432 Centrilobular emphysema: Secondary | ICD-10-CM

## 2022-11-29 DIAGNOSIS — F418 Other specified anxiety disorders: Secondary | ICD-10-CM

## 2022-11-29 DIAGNOSIS — G894 Chronic pain syndrome: Secondary | ICD-10-CM

## 2022-11-29 NOTE — Chronic Care Management (AMB) (Signed)
Chronic Care Management   CCM RN Visit Note  11/29/2022 Name: Avontae Burkhead MRN: [id number] DOB: [birth year]  Subjective: Nile Prisk is a 88 y.o. year old male who is a primary care patient of Smitty Cords, DO. The patient was referred to the Chronic Care Management team for assistance with care management needs subsequent to provider initiation of CCM services and plan of care.    Today's Visit:   Spoke to the patients daughter and DPR, [name]  for follow up visit.        Goals Addressed             This Visit's Progress    COMPLETED: CCM Expected Outcome:  Monitor, Self-Manage and Reduce Symptoms of: Anxiety/Depression       Current Barriers: The patient is now at rehab waiting for SNF placement. Closing the goals of care. Will let the pcp know.  Knowledge Deficits related to resources available to help the patient with mental health needs.  Care Coordination needs related to ongoing support and education of managing panic attacks, anxiety, and depression  in a patient with anxiety and depression Chronic Disease Management support and education needs related to effective management of depression and anxiety  Planned Interventions: Evaluation of current treatment plan related to depression and anxiety and patient's adherence to plan as established by provider. Charlie the patients caregiver states that patient is doing much better now. He is working with home health and moving around a lot better. Changes in his medications have been very helpful. She  states he is not depressed and he is not having any panic attacks. She is so thankful for positive changes she is seeing in the patient.  Education and support given.  Advised patient to to call the office for changes in mood, anxiety, depression, or mental health needs. Also advised the caregiver to keep appointments with the mental health. [facility] is working with the patient in the home setting also. The patient is  doing much better and Charlie states the changes in medications has been so helpful.   Provided education to patient re: mindfulness, tactics to help the patient when he is acting out or having extreme anxiety and information to be sent in mail for the caregiver to have as resources to help with mindfulness and other stress reduction activities Reviewed medications with patient and discussed compliance. The caregiver assist with medications and states compliance with medications. Denies any acute findings today. Is happy to report that the last change by the pcp has had positive results.  Collaborated with pcp and LCSW  regarding Hospice declining referral and the expressed needs of the caregiver for helping the patient with his mental health needs Provided patient with mindfulness and other anxiety and depression  educational materials related to decreasing anxiety and depression and panic attacks Reviewed scheduled/upcoming provider appointments including no upcoming appointments with the pcp but the caregiver knows to call for changes or needs.  Social Work referral for assistance with support for caregiver and recommendation on measures to help with depression and anxiety Discussed plans with patient for ongoing care management follow up and provided patient with direct contact information for care management team Advised patient to discuss changes in mental health and well being with provider Screening for signs and symptoms of depression related to chronic disease state  Assessed social determinant of health barriers  Symptom Management: Take medications as prescribed   Attend all scheduled provider appointments Call provider office for new concerns or questions  call the Suicide and Crisis Lifeline: 988 call the [place] National Suicide Prevention Lifeline: 5815931910 or TTY: (938)692-4876 TTY 986 434 9959) to talk to a trained counselor call 1-800-273-TALK (toll free, 24 hour hotline) if  experiencing a Mental Health or Behavioral Health Crisis   Follow Up Plan: Telephone follow up appointment with care management team member scheduled for: 10-30-2022 at 1 pm       CCM:  Maintain, Monitor and Self-Manage Symptoms of COPD       Current Barriers: Closing the goals of care. The patient is at Rehab and will be going to SNF Knowledge Deficits related to progression of COPD and how to effectively manage Care Coordination needs related to caregiver resources for helping in the care of the patient in a patient with COPD Chronic Disease Management support and education needs related to effective management of COPD  Planned Interventions: Provided patient with basic written and verbal COPD education on self care/management/and exacerbation prevention. The patients COPD is stable. Since changes in his medications and getting him on a routine the patient is doing much better. The patients caregiver states that [facility] is coming in and working with him and they are helping him with his activity and it has made a positive difference. 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 Billey Gosling has went to [employer] and gotten him some audio books and this is giving him some independence as well. She is trying to find a solution to help him be able to move independently from his room to other rooms in the house. She is open for recommendations and ideas.  Advised patient to track and manage COPD triggers. Review of triggers and monitoring for changes in his environment that may increase his risk of COPD exacerbation.  Provided written and verbal instructions on pursed lip breathing and utilized returned demonstration as teach back Provided instruction about proper use of medications used for management of COPD including inhalers Advised patient to self assesses COPD action plan zone and make appointment with provider if in the yellow zone for 48 hours without improvement Advised patient to  engage in light exercise as tolerated 3-5 days a week to aid in the the management of COPD Provided education about and advised patient to utilize infection prevention strategies to reduce risk of respiratory infection. Discussed infection prevention and being cautious. The patient does not go out much only to appointments. His caregiver gets things he needs and cares for him in her home.  Discussed the importance of adequate rest and management of fatigue with COPD Screening for signs and symptoms of depression related to chronic disease state  Assessed social determinant of health barriers  Symptom Management: Take medications as prescribed   Attend all scheduled provider appointments Call provider office for new concerns or questions  call the Suicide and Crisis Lifeline: 988 call the [place] National Suicide Prevention Lifeline: 845-219-4240 or TTY: (403)442-2229 TTY 704-863-8993) to talk to a trained counselor call 1-800-273-TALK (toll free, 24 hour hotline) if experiencing a Mental Health or Behavioral Health Crisis  eliminate smoking in my home identify and remove indoor air pollutants limit outdoor activity during cold weather develop a rescue plan eliminate symptom triggers at home follow rescue plan if symptoms flare-up  Follow Up Plan: Telephone follow up appointment with care management team member scheduled for: 10-30-2022-2024 at 1 pm          Plan:No further follow up required: the patient is going to transition to SNF, waiting for bed  placement  Alto Denver RN, MSN, CCM RN Care Manager  Chronic Care Management Direct Number: [phone]

## 2022-11-29 NOTE — Patient Instructions (Signed)
Please call the care guide team at [phone] if you need to cancel or reschedule your appointment.   If you are experiencing a Mental Health or Behavioral Health Crisis or need someone to talk to, please call the Suicide and Crisis Lifeline: 988 call the [place] National Suicide Prevention Lifeline: (636)589-3415 or TTY: 959 570 5177 TTY 228-482-3296) to talk to a trained counselor call 1-800-273-TALK (toll free, 24 hour hotline)   Following is a copy of the CCM Program Consent:  CCM service includes personalized support from designated clinical staff supervised by the physician, including individualized plan of care and coordination with other care providers 24/7 contact phone numbers for assistance for urgent and routine care needs. Service will only be billed when office clinical staff spend 20 minutes or more in a month to coordinate care. Only one practitioner may furnish and bill the service in a calendar month. The patient may stop CCM services at amy time (effective at the end of the month) by phone call to the office staff. The patient will be responsible for cost sharing (co-pay) or up to 20% of the service fee (after annual deductible is met)  Following is a copy of your full provider care plan:   Goals Addressed             This Visit's Progress    COMPLETED: CCM Expected Outcome:  Monitor, Self-Manage and Reduce Symptoms of: Anxiety/Depression       Current Barriers: The patient is now at rehab waiting for SNF placement. Closing the goals of care. Will let the pcp know.  Knowledge Deficits related to resources available to help the patient with mental health needs.  Care Coordination needs related to ongoing support and education of managing panic attacks, anxiety, and depression  in a patient with anxiety and depression Chronic Disease Management support and education needs related to effective management of depression and anxiety  Planned Interventions: Evaluation of  current treatment plan related to depression and anxiety and patient's adherence to plan as established by provider. Charlie the patients caregiver states that patient is doing much better now. He is working with home health and moving around a lot better. Changes in his medications have been very helpful. She  states he is not depressed and he is not having any panic attacks. She is so thankful for positive changes she is seeing in the patient.  Education and support given.  Advised patient to to call the office for changes in mood, anxiety, depression, or mental health needs. Also advised the caregiver to keep appointments with the mental health. [facility] is working with the patient in the home setting also. The patient is doing much better and Charlie states the changes in medications has been so helpful.   Provided education to patient re: mindfulness, tactics to help the patient when he is acting out or having extreme anxiety and information to be sent in mail for the caregiver to have as resources to help with mindfulness and other stress reduction activities Reviewed medications with patient and discussed compliance. The caregiver assist with medications and states compliance with medications. Denies any acute findings today. Is happy to report that the last change by the pcp has had positive results.  Collaborated with pcp and LCSW  regarding Hospice declining referral and the expressed needs of the caregiver for helping the patient with his mental health needs Provided patient with mindfulness and other anxiety and depression  educational materials related to decreasing anxiety and depression and panic attacks Reviewed  scheduled/upcoming provider appointments including no upcoming appointments with the pcp but the caregiver knows to call for changes or needs.  Social Work referral for assistance with support for caregiver and recommendation on measures to help with depression and anxiety Discussed  plans with patient for ongoing care management follow up and provided patient with direct contact information for care management team Advised patient to discuss changes in mental health and well being with provider Screening for signs and symptoms of depression related to chronic disease state  Assessed social determinant of health barriers  Symptom Management: Take medications as prescribed   Attend all scheduled provider appointments Call provider office for new concerns or questions  call the Suicide and Crisis Lifeline: 988 call the [place] National Suicide Prevention Lifeline: 3866635490 or TTY: (781) 486-3595 TTY 959-605-3887) to talk to a trained counselor call 1-800-273-TALK (toll free, 24 hour hotline) if experiencing a Mental Health or Behavioral Health Crisis   Follow Up Plan: Telephone follow up appointment with care management team member scheduled for: 10-30-2022 at 1 pm       CCM:  Maintain, Monitor and Self-Manage Symptoms of COPD       Current Barriers: Closing the goals of care. The patient is at Rehab and will be going to SNF Knowledge Deficits related to progression of COPD and how to effectively manage Care Coordination needs related to caregiver resources for helping in the care of the patient in a patient with COPD Chronic Disease Management support and education needs related to effective management of COPD  Planned Interventions: Provided patient with basic written and verbal COPD education on self care/management/and exacerbation prevention. The patients COPD is stable. Since changes in his medications and getting him on a routine the patient is doing much better. The patients caregiver states that [facility] is coming in and working with him and they are helping him with his activity and it has made a positive difference. 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 Billey Gosling has went to [employer] and gotten him some audio books and this is giving him some  independence as well. She is trying to find a solution to help him be able to move independently from his room to other rooms in the house. She is open for recommendations and ideas.  Advised patient to track and manage COPD triggers. Review of triggers and monitoring for changes in his environment that may increase his risk of COPD exacerbation.  Provided written and verbal instructions on pursed lip breathing and utilized returned demonstration as teach back Provided instruction about proper use of medications used for management of COPD including inhalers Advised patient to self assesses COPD action plan zone and make appointment with provider if in the yellow zone for 48 hours without improvement Advised patient to engage in light exercise as tolerated 3-5 days a week to aid in the the management of COPD Provided education about and advised patient to utilize infection prevention strategies to reduce risk of respiratory infection. Discussed infection prevention and being cautious. The patient does not go out much only to appointments. His caregiver gets things he needs and cares for him in her home.  Discussed the importance of adequate rest and management of fatigue with COPD Screening for signs and symptoms of depression related to chronic disease state  Assessed social determinant of health barriers  Symptom Management: Take medications as prescribed   Attend all scheduled provider appointments Call provider office for new concerns or questions  call the Suicide  and Crisis Lifeline: 988 call the [place] National Suicide Prevention Lifeline: 225 286 5134 or TTY: (906)688-3792 TTY (802)479-0441) to talk to a trained counselor call 1-800-273-TALK (toll free, 24 hour hotline) if experiencing a Mental Health or Behavioral Health Crisis  eliminate smoking in my home identify and remove indoor air pollutants limit outdoor activity during cold weather develop a rescue plan eliminate symptom  triggers at home follow rescue plan if symptoms flare-up  Follow Up Plan: Telephone follow up appointment with care management team member scheduled for: 10-30-2022-2024 at 1 pm          Patient verbalizes understanding of instructions and care plan provided today and agrees to view in MyChart. Active MyChart status and patient understanding of how to access instructions and care plan via MyChart confirmed with patient.  No further follow up required: The patient is in Rehab and is transitioning to SNF

## 2022-12-15 ENCOUNTER — Other Ambulatory Visit: Payer: Self-pay | Admitting: General Surgery

## 2022-12-15 DIAGNOSIS — C439 Malignant melanoma of skin, unspecified: Secondary | ICD-10-CM

## 2022-12-15 DIAGNOSIS — R6889 Other general symptoms and signs: Secondary | ICD-10-CM | POA: Insufficient documentation

## 2022-12-15 DIAGNOSIS — I890 Lymphedema, not elsewhere classified: Secondary | ICD-10-CM | POA: Insufficient documentation

## 2022-12-15 DIAGNOSIS — D0359 Melanoma in situ of other part of trunk: Secondary | ICD-10-CM | POA: Insufficient documentation

## 2022-12-18 NOTE — Progress Notes (Unsigned)
PROVIDER NOTE: Information contained herein reflects review and annotations entered in association with encounter. Interpretation of such information and data should be left to medically-trained personnel. Information provided to patient can be located elsewhere in the medical record under "Patient Instructions". Document created using STT-dictation technology, any transcriptional errors that may result from process are unintentional.    Patient: Aaron Gibbs  Service Category: E/M  Provider: Oswaldo Done, MD  DOB: 09/22/1934  DOS: 12/20/2022  Referring Provider: Saralyn Pilar *  MRN: [id number]  Specialty: Interventional Pain Management  PCP: Smitty Cords, DO  Type: Established Patient  Setting: Ambulatory outpatient    Location: Office  Delivery: Face-to-face     HPI  Mr. Jacolby Brow, a 88 y.o. year old male, is here today because of his No primary diagnosis found.. Mr. Vogelsang primary complain today is No chief complaint on file.  Pertinent problems: Mr. Cugini has Chronic pain syndrome; DISH (diffuse idiopathic skeletal hyperostosis); Osteoarthritis of multiple joints; Chronic knee pain (1ry area of Pain) (Bilateral) (L>R); Chronic low back pain (Bilateral) w/o sciatica; Somatic symptom disorder; Tricompartment osteoarthritis of knee (Left); Osteoarthritis of knee (Bilateral); Osteoarthritis of patellofemoral joint (Right); Neurogenic pain; Lateral meniscal tear, sequela (Left); Medial meniscal tear, sequela (Left); Patellar tendinosis (Right); Osteoarthritis of knee (Left); Abnormal MRI, knee (08/05/2018) (Left); Chronic knee pain (Left); Pain in right knee; and Other chronic pain on their pertinent problem list. Pain Assessment: Severity of   is reported as a  /10. Location:    / . Onset:  . Quality:  . Timing:  . Modifying factor(s):  Marland Kitchen Vitals:  vitals were not taken for this visit.  BMI: Estimated body mass index is 29.29 kg/m as calculated from the  following:   Height as of 11/18/22: 5\' 11"  (1.803 m).   Weight as of 11/18/22: 210 lb (95.3 kg). Last encounter: 09/28/2022. Last procedure: 09/07/2022.  Reason for encounter:  *** . ***  Pharmacotherapy Assessment  Analgesic: No opioid analgesics prescribed by our practice.  Buprenorphine 10 mcg/h patch q. 7 days.  High risk for SUD.  History of suicidal attempts and noncompliance with medication intake. ED visits thought to be due to medication binging. MME/day: 0 mg/day.   Monitoring: [place] PMP: PDMP reviewed during this encounter.       Pharmacotherapy: No side-effects or adverse reactions reported. Compliance: No problems identified. Effectiveness: Clinically acceptable.  No notes on file  No results found for: "CBDTHCR" No results found for: "D8THCCBX" No results found for: "D9THCCBX"  UDS:  Summary  Date Value Ref Range Status  09/28/2022 Note  Final    Comment:    ==================================================================== [employer] Select 13 (MW) ==================================================================== Test                             Result       Flag       Units  Drug Present and Declared for Prescription Verification   Lorazepam                      714          EXPECTED   ng/mg creat    Source of lorazepam is a scheduled prescription medication.  Drug Absent but Declared for Prescription Verification   Buprenorphine                  Not Detected UNEXPECTED ng/mg creat    Transdermal buprenorphine, as indicated in  the declared medication    list, is not always detected even when used as directed.  ==================================================================== Test                      Result    Flag   Units      Ref Range   Creatinine              108              mg/dL      >=16 ==================================================================== Declared Medications:  The flagging and interpretation on this report are based on the   following declared medications.  Unexpected results may arise from  inaccuracies in the declared medications.   **Note: The testing scope of this panel includes these medications:   Lorazepam (Ativan)   **Note: The testing scope of this panel does not include small to  moderate amounts of these reported medications:   Buprenorphine Patch (BuTrans)   **Note: The testing scope of this panel does not include the  following reported medications:   Acetaminophen (Tylenol)  Betamethasone (Lotrisone)  Bumetanide (Bumex)  Carvedilol (Coreg)  Clotrimazole (Lotrisone)  Diclofenac (Voltaren)  Dorzolamide  Duloxetine (Cymbalta)  Finasteride (Proscar)  Gabapentin (Neurontin)  Iron  Latanoprost (Xalatan)  Melatonin  Meloxicam (Mobic)  Mupirocin (Bactroban)  Nystatin (Mycostatin)  Olanzapine (Zyprexa)  Pantoprazole (Protonix)  Polyethylene Glycol  Tamsulosin (Flomax)  Timolol  Trazodone (Desyrel) ==================================================================== For clinical consultation, please call [phone]. ====================================================================       ROS  Constitutional: Denies any fever or chills Gastrointestinal: No reported hemesis, hematochezia, vomiting, or acute GI distress Musculoskeletal: Denies any acute onset joint swelling, redness, loss of ROM, or weakness Neurological: No reported episodes of acute onset apraxia, aphasia, dysarthria, agnosia, amnesia, paralysis, loss of coordination, or loss of consciousness  Medication Review  DULoxetine, Iron (Ferrous Sulfate), Melatonin, OLANZapine, Oxycodone HCl, acetaminophen, bumetanide, buprenorphine, carvedilol, diclofenac Sodium, dorzolamide-timolol, feeding supplement, finasteride, gabapentin, latanoprost, meloxicam, pantoprazole, polyethylene glycol, tamsulosin, and traZODone  History Review  Allergy: Mr. Retana is allergic to anthralin and azithromycin. Drug: Mr. Oppedisano   reports no history of drug use. Alcohol:  reports that he does not currently use alcohol. Tobacco:  reports that he has quit smoking. His smoking use included cigarettes. His smokeless tobacco use includes chew. Social: Mr. Spiegler  reports that he has quit smoking. His smoking use included cigarettes. His smokeless tobacco use includes chew. He reports that he does not currently use alcohol. He reports that he does not use drugs. Medical:  has a past medical history of Anxiety, Cancer (HCC), COPD (chronic obstructive pulmonary disease) (HCC), Glaucoma, Osteoporosis, and Suicide attempt (HCC) (05/13/18) (06/26/2018). Surgical: Mr. Obier  has a past surgical history that includes Cholecystectomy and Transurethral resection of prostate (N/A, 2008). Family: family history includes Depression in his mother; Heart disease in his father.  Laboratory Chemistry Profile   Renal Lab Results  Component Value Date   BUN 13 10/23/2022   CREATININE 0.81 10/23/2022   LABCREA 631 12/13/2019   BCR 28 (H) 07/10/2018   GFRAA >60 02/09/2020   GFRNONAA >60 10/23/2022    Hepatic Lab Results  Component Value Date   AST 24 10/18/2022   ALT 26 10/18/2022   ALBUMIN 2.6 (L) 10/18/2022   ALKPHOS 41 10/18/2022   LIPASE 20 10/10/2022   AMMONIA 25 09/13/2019    Electrolytes Lab Results  Component Value Date   NA 141 10/23/2022   K 3.8 10/23/2022   CL 112 (  H) 10/23/2022   CALCIUM 8.7 (L) 10/23/2022   MG 2.2 10/23/2022   PHOS 3.4 03/30/2019    Bone Lab Results  Component Value Date   VD25OH 26 08/15/2016   25OHVITD1 34 07/10/2018   25OHVITD2 <1.0 07/10/2018   25OHVITD3 34 07/10/2018    Inflammation (CRP: Acute Phase) (ESR: Chronic Phase) Lab Results  Component Value Date   CRP 5 07/10/2018   ESRSEDRATE 20 07/10/2018   LATICACIDVEN 2.7 (HH) 10/17/2022         Note: Above Lab results reviewed.  Recent Imaging Review  DG Chest Portable 1 View CLINICAL DATA:  Fall  EXAM: PORTABLE CHEST 1  VIEW  COMPARISON:  Chest x-ray 10/17/2022.  CT of the chest 10/10/2022.  FINDINGS: The heart size and mediastinal contours are within normal limits. There is a new focal opacity in the left mid lung measuring 3.4 x 2.5 cm, indeterminate. There is no pleural effusion or pneumothorax. No acute fractures are seen.  IMPRESSION: 1. New focal opacity in the left mid lung measuring 3.4 x 2.5 cm, indeterminate. Recommend further evaluation with chest CT. 2. No acute fractures are seen.  Electronically Signed   By: Darliss Cheney M.D.   On: 11/18/2022 23:36 CT Cervical Spine Wo Contrast CLINICAL DATA:  Neck trauma (Age >= 65y)  Recent C2 fracture.  EXAM: CT CERVICAL SPINE WITHOUT CONTRAST  TECHNIQUE: Multidetector CT imaging of the cervical spine was performed without intravenous contrast. Multiplanar CT image reconstructions were also generated.  RADIATION DOSE REDUCTION: This exam was performed according to the departmental dose-optimization program which includes automated exposure control, adjustment of the mA and/or kV according to patient size and/or use of iterative reconstruction technique.  COMPARISON:  CT 10/17/2022  FINDINGS: Alignment: Exaggerated cervical kyphosis and associated straightening of cervical lordosis.  Skull base and vertebrae: Mildly displaced type 2 dens fracture extending to the right lateral mass. No significant change from prior exam. There is no solid bony bridging. There may be some peripheral callus formation. No new or additional fracture. Diffuse flowing anterior osteophytes throughout the cervical and included upper thoracic spine.  Soft tissues and spinal canal: No definite canal hematoma related to C2 fracture. Mild thickening of the prevertebral soft tissues at the level of fracture.  Disc levels: Stable multilevel degenerative change with diffuse flowing anterior osteophytes and facet ankylosis.  Upper chest: No acute  findings.  Other: None.  IMPRESSION: 1. Mildly displaced type 2 dens fracture extending to the right lateral mass without significant change from prior exam. There is no solid bony bridging. There may be some peripheral callus formation. 2. No new fracture. 3. Diffuse flowing anterior osteophytes and facet ankylosis.  Electronically Signed   By: Narda Rutherford M.D.   On: 11/18/2022 23:34 CT Head Wo Contrast CLINICAL DATA:  Head trauma, minor (Age >= 65y)  Recent cervical spine fracture.  EXAM: CT HEAD WITHOUT CONTRAST  TECHNIQUE: Contiguous axial images were obtained from the base of the skull through the vertex without intravenous contrast.  RADIATION DOSE REDUCTION: This exam was performed according to the departmental dose-optimization program which includes automated exposure control, adjustment of the mA and/or kV according to patient size and/or use of iterative reconstruction technique.  COMPARISON:  Head CT 10/17/2022  FINDINGS: Brain: No intracranial hemorrhage, mass effect, or midline shift. Age related atrophy. No hydrocephalus. The basilar cisterns are patent. Stable degree of chronic small vessel ischemia. No evidence of territorial infarct or acute ischemia. No extra-axial or intracranial fluid collection.  Vascular: Atherosclerosis of skullbase vasculature without hyperdense vessel or abnormal calcification.  Skull: No fracture or focal lesion.  Sinuses/Orbits: Chronic mucosal thickening of the paranasal sinuses. No acute findings. No mastoid effusion.  Other: None.  IMPRESSION: 1. No acute intracranial abnormality. No skull fracture. 2. Age related atrophy and chronic small vessel ischemia.  Electronically Signed   By: Narda Rutherford M.D.   On: 11/18/2022 23:28 Note: Reviewed        Physical Exam  General appearance: Well nourished, well developed, and well hydrated. In no apparent acute distress Mental status: Alert, oriented x 3  (person, place, & time)       Respiratory: No evidence of acute respiratory distress Eyes: PERLA Vitals: There were no vitals taken for this visit. BMI: Estimated body mass index is 29.29 kg/m as calculated from the following:   Height as of 11/18/22: 5\' 11"  (1.803 m).   Weight as of 11/18/22: 210 lb (95.3 kg). Ideal: Patient weight not recorded  Assessment   Diagnosis Status  No diagnosis found. Controlled Controlled Controlled   Updated Problems: No problems updated.  Plan of Care  Problem-specific:  No problem-specific Assessment & Plan notes found for this encounter.  Mr. Fionn Willitts has a current medication list which includes the following long-term medication(s): bumetanide, buprenorphine, buprenorphine, carvedilol, diclofenac sodium, duloxetine, gabapentin, iron (ferrous sulfate), olanzapine, pantoprazole, and trazodone.  Pharmacotherapy (Medications Ordered): No orders of the defined types were placed in this encounter.  Orders:  No orders of the defined types were placed in this encounter.  Follow-up plan:   No follow-ups on file.      Interventional Therapies  Risk Factors  Considerations:  Patient is legally blind. High risk for substance use disorder.  The patient has documented suicidal attempt using medications.  In addition, we have documented the patient to have a binge use pattern and been noncompliant with the instructions on how to use the medications.  This is a reason why went to a buprenorphine patch.   Planned  Pending:   Palliative left Knee genicular nerve RFA #5    Under consideration:   Palliative bilateral genicular nerve RFA maintenance treatments   Completed:   Therapeutic bilateral IA steroid knee inj. (09/07/2022) (100/100/20/0)  Palliative bilateral IA Hyalgan knee injections Sx2  Diagnostic bilateral genicular NB x1  Palliative right genicular nerve RFA x2 (04/06/2020) (100/100/50/100)  Palliative left genicular nerve RFA x4  (04/18/2022) (100/100/30/40)    Completed by other providers:   N/A   Therapeutic  Palliative (PRN) options:   Palliative bilateral IA Hyalgan knee injections S3/N1  Diagnostic bilateral genicular NB #2  Palliative bilateral genicular nerve RFA #3    Pharmacotherapy  Nonopioid transferred 11/02/2020: Voltaren gel        Recent Visits Date Type Provider Dept  09/28/22 Office Visit Delano Metz, MD [facility]  Showing recent visits within past 90 days and meeting all other requirements Future Appointments Date Type Provider Dept  12/20/22 Appointment Delano Metz, MD [facility]  Showing future appointments within next 90 days and meeting all other requirements  I discussed the assessment and treatment plan with the patient. The patient was provided an opportunity to ask questions and all were answered. The patient agreed with the plan and demonstrated an understanding of the instructions.  Patient advised to call back or seek an in-person evaluation if the symptoms or condition worsens.  Duration of encounter: *** minutes.  Total time on encounter, as per AMA guidelines included  both the face-to-face and non-face-to-face time personally spent by the physician and/or other qualified health care professional(s) on the day of the encounter (includes time in activities that require the physician or other qualified health care professional and does not include time in activities normally performed by clinical staff). Physician's time may include the following activities when performed: Preparing to see the patient (e.g., pre-charting review of records, searching for previously ordered imaging, lab work, and nerve conduction tests) Review of prior analgesic pharmacotherapies. Reviewing PMP Interpreting ordered tests (e.g., lab work, imaging, nerve conduction tests) Performing post-procedure evaluations, including interpretation of diagnostic  procedures Obtaining and/or reviewing separately obtained history Performing a medically appropriate examination and/or evaluation Counseling and educating the patient/family/caregiver Ordering medications, tests, or procedures Referring and communicating with other health care professionals (when not separately reported) Documenting clinical information in the electronic or other health record Independently interpreting results (not separately reported) and communicating results to the patient/ family/caregiver Care coordination (not separately reported)  Note by: Oswaldo Done, MD Date: 12/20/2022; Time: 6:03 AM

## 2022-12-19 ENCOUNTER — Telehealth: Payer: Self-pay

## 2022-12-19 NOTE — Transitions of Care (Post Inpatient/ED Visit) (Signed)
   12/19/2022  Name: Kevante Nardini MRN: [id number] DOB: 26-Jul-1934  Today's TOC FU Call Status: Today's TOC FU Call Status:: Unsuccessul Call (1st Attempt) Unsuccessful Call (1st Attempt) Date: 12/19/22  Attempted to reach the patient regarding the most recent Inpatient/ED visit.  Follow Up Plan: Additional outreach attempts will be made to reach the patient to complete the Transitions of Care (Post Inpatient/ED visit) call.   Signature  Woodfin Ganja LPN [facility] Nurse Health Advisor Direct Dial [phone]

## 2022-12-19 NOTE — Transitions of Care (Post Inpatient/ED Visit) (Signed)
12/19/2022  Name: Aaron Gibbs MRN: [id number] DOB: 1934-09-04  Today's TOC FU Call Status: Today's TOC FU Call Status:: Successful TOC FU Call Competed Unsuccessful Call (1st Attempt) Date: 12/19/22 [facility] FU Call Complete Date: 12/19/22  Transition Care Management Follow-up Telephone Call Date of Discharge: 12/18/22 Discharge Facility: Other ([facility]) Name of Other ([facility]) Discharge Facility: [facility] Type of Discharge: Inpatient Admission Primary Inpatient Discharge Diagnosis:: weakness How have you been since you were released from the hospital?: Same Any questions or concerns?: Yes Patient Questions/Concerns:: daughter states "pt was screaming and said that he was worse and wanted to go back to a hospital"  Items Reviewed: Did you receive and understand the discharge instructions provided?: Yes Medications obtained,verified, and reconciled?: Yes (Medications Reviewed) Any new allergies since your discharge?: No Dietary orders reviewed?: Yes Do you have support at home?: Yes  Medications Reviewed Today: Medications Reviewed Today     Reviewed by Merleen Nicely, LPN (Licensed Practical Nurse) on 12/19/22 at 1213  Med List Status: <None>   Medication Order Taking? Sig Documenting Provider Last Dose Status Informant  acetaminophen (TYLENOL) 500 MG tablet [id number] Yes Take 1,000 mg by mouth in the morning, at noon, and at bedtime. [provider] Taking Active Care Giver  bumetanide (BUMEX) 1 MG tablet [id number] Yes Take 1/2 (one-half) tablet by mouth twice daily  Patient taking differently: Take 0.5 mg by mouth 2 (two) times daily.   Smitty Cords, DO Taking Active Care Giver  buprenorphine [facility]) 10 MCG/HR [place] [id number] Yes Place 1 patch onto the skin once a week for 28 days. Apply only 1 patch at a time and alternate sites weekly. Delano Metz, MD Taking Active Care Giver           Med Note [facility]  [place] [facility]   Thu Sep 28, 2022  2:22 PM) WARNING: Future prescription, NOT a DUPLICATE. DO NOT DELETE. Do not delete during hospital medication reconciliation or at discharge. DO NOT LABEL as "Patient not taking".  buprenorphine (BUTRANS) 10 MCG/HR PTWK [id number] Yes Place 1 patch onto the skin once a week. Apply only 1 patch at a time and alternate sites weekly. Narda Bonds, MD Taking Active   carvedilol (COREG) 12.5 MG tablet [id number] Yes Take 1 tablet by mouth twice daily Smitty Cords, DO Taking Active Care Giver  COSOPT 22.3-6.8 MG/ML ophthalmic solution [id number] Yes Place 1 drop into both eyes 2 (two) times daily. [provider] Taking Active Care Giver  diclofenac Sodium (VOLTAREN) 1 % GEL [id number] Yes Apply 2 g topically at bedtime. Apply to both knees twice daily  Patient taking differently: Apply 2 g topically at bedtime. Apply to both knees at night   Smitty Cords, DO Taking Active Care Giver  DULoxetine (CYMBALTA) 30 MG capsule [id number] Yes Take 3 capsules (90 mg total) by mouth daily. Smitty Cords, DO Taking Active Care Giver           Med Note Julieanne Manson, Denzell Colasanti H   Tue Dec 19, 2022 12:07 PM) Pt takes 60 mg daily    feeding supplement (ENSURE ENLIVE / ENSURE PLUS) LIQD [id number] Yes Take 237 mLs by mouth 4 (four) times daily -  before meals and at bedtime. Narda Bonds, MD Taking Active   finasteride (PROSCAR) 5 MG tablet [id number] Yes Take 1 tablet (5 mg total) by mouth daily. Smitty Cords, DO Taking Active Care Giver  gabapentin (NEURONTIN) 300 MG capsule [id number] Yes Take  1 capsule (300 mg total) by mouth daily.  Patient taking differently: Take 300 mg by mouth at bedtime.   Smitty Cords, DO Taking Active Care Giver           Med Note Julieanne Manson, Oaklyn Jakubek H   Tue Dec 19, 2022 12:08 PM) Pt takes 300mg  BID    Iron, Ferrous Sulfate, 325 (65 Fe) MG TABS [id number] Yes Take 325 mg by mouth daily with supper.  Smitty Cords, DO Taking Active Care Giver  latanoprost (XALATAN) 0.005 % ophthalmic solution [id number] Yes Place 1 drop into both eyes at bedtime. [provider] Taking Active Care Giver  Melatonin 5 MG CAPS [id number] Yes Take 1 capsule (5 mg total) by mouth at bedtime.  Patient taking differently: Take 10 mg by mouth at bedtime.   Smitty Cords, DO Taking Active Care Giver  meloxicam [facility]) 15 MG tablet [id number] Yes Take 15 mg by mouth daily. [provider] Taking Active Care Giver  OLANZapine [facility]) 2.5 MG tablet [id number] Yes Take 1 tablet by mouth twice daily Lorre Munroe, NP Taking Active Care Giver  oxyCODONE 10 MG TABS [id number] Yes Take 0.5-1 tablets (5-10 mg total) by mouth every 4 (four) hours as needed for severe pain or moderate pain. Narda Bonds, MD Taking Active            Med Note Julieanne Manson, Bianey Tesoro H   Tue Dec 19, 2022 12:12 PM) Pt takes 10mg  every 6 hrs prn pain    pantoprazole (PROTONIX) 40 MG tablet [id number] Yes Take 1 tablet (40 mg total) by mouth daily. Smitty Cords, DO Taking Active Care Giver  polyethylene glycol (MIRALAX / GLYCOLAX) 17 g packet [id number] Yes Take 17 g by mouth 2 (two) times daily. Narda Bonds, MD Taking Active   tamsulosin [facility]) 0.4 MG CAPS capsule [id number] Yes Take 1 capsule by mouth once daily Smitty Cords, DO Taking Active Care Giver  traZODone (DESYREL) 150 MG tablet [id number] No TAKE 1 TABLET BY MOUTH AT BEDTIME AS NEEDED  Patient not taking: Reported on 12/19/2022   Smitty Cords, DO Not Taking Active Care Giver  Med List Note Concepcion Elk, RN 09/28/22 1434): UDS 09-28-22 MR 12-27-22 Med agreement signed and explained to pat that he would need to bring old patches and the box with him to his refill appts. Patient with understanding. Gave copy so that caretaker could read to him. I reviewed it verbally. cfg            Home Care and  Equipment/Supplies: Were Home Health Services Ordered?: Yes Name of Home Health Agency:: unsure of name Has Agency set up a time to come to your home?: No Any new equipment or medical supplies ordered?: Yes Name of Medical supply agency?: unsure of name Were you able to get the equipment/medical supplies?: Yes (hospital bed) Do you have any questions related to the use of the equipment/supplies?: No  Functional Questionnaire: Do you need assistance with bathing/showering or dressing?: Yes Do you need assistance with meal preparation?: Yes Do you need assistance with eating?: Yes Do you have difficulty maintaining continence: Yes (incontinent) Do you need assistance with getting out of bed/getting out of a chair/moving?: Yes Do you have difficulty managing or taking your medications?: Yes (daughter manages)  Follow up appointments reviewed: PCP Follow-up appointment confirmed?: No (daughter will call to schedule fu appt prior to 01-01-23) MD Provider Line Number:[phone] Given: Yes Follow-up Provider: Saralyn Pilar DO Specialist  Hospital Follow-up appointment confirmed?: No Do you need transportation to your follow-up appointment?: No Do you understand care options if your condition(s) worsen?: Yes-patient verbalized understanding    SIGNATURE  Woodfin Ganja LPN [facility] Nurse Health Advisor Direct Dial [phone]

## 2022-12-20 ENCOUNTER — Other Ambulatory Visit: Payer: Self-pay | Admitting: Family Medicine

## 2022-12-20 ENCOUNTER — Ambulatory Visit (HOSPITAL_BASED_OUTPATIENT_CLINIC_OR_DEPARTMENT_OTHER): Payer: Medicare Other | Admitting: Pain Medicine

## 2022-12-20 ENCOUNTER — Telehealth: Payer: Self-pay

## 2022-12-20 DIAGNOSIS — Z91199 Patient's noncompliance with other medical treatment and regimen due to unspecified reason: Secondary | ICD-10-CM

## 2022-12-20 MED ORDER — MELOXICAM 15 MG PO TABS: 15.0000 mg | ORAL_TABLET | Freq: Every day | ORAL | 2 refills | Status: DC

## 2022-12-20 NOTE — Telephone Encounter (Signed)
Requested medication (s) are due for refill today - provider review  Requested medication (s) are on the active medication list -yes  Future visit scheduled -yes  Last refill: 06/14/22  Notes to clinic: outside provider, fails lab RF protocol- outside labs  Requested Prescriptions  Pending Prescriptions Disp Refills   meloxicam (MOBIC) 15 MG tablet      Sig: Take 1 tablet (15 mg total) by mouth daily.     Analgesics:  COX2 Inhibitors Failed - 12/20/2022 11:42 AM      Failed - Manual Review: Labs are only required if the patient has taken medication for more than 8 weeks.      Failed - HGB in normal range and within 360 days    Hemoglobin  Date Value Ref Range Status  10/23/2022 10.1 (L) 13.0 - 17.0 g/dL Final         Failed - HCT in normal range and within 360 days    HCT  Date Value Ref Range Status  10/23/2022 31.5 (L) 39.0 - 52.0 % Final         Passed - Cr in normal range and within 360 days    Creatinine, Ser  Date Value Ref Range Status  10/23/2022 0.81 0.61 - 1.24 mg/dL Final   Creatinine, Urine  Date Value Ref Range Status  12/13/2019 631 mg/dL Final    Comment:    RESULTS CONFIRMED BY MANUAL DILUTION Performed at [facility], [address], [place], [place] [address]          Passed - AST in normal range and within 360 days    AST  Date Value Ref Range Status  10/18/2022 24 15 - 41 U/L Final         Passed - ALT in normal range and within 360 days    ALT  Date Value Ref Range Status  10/18/2022 26 0 - 44 U/L Final         Passed - eGFR is 30 or above and within 360 days    GFR calc Af Amer  Date Value Ref Range Status  02/09/2020 >60 >60 mL/min Final   GFR, Estimated  Date Value Ref Range Status  10/23/2022 >60 >60 mL/min Final    Comment:    (NOTE) Calculated using the CKD-EPI Creatinine Equation (2021)          Passed - Patient is not pregnant      Passed - Valid encounter within last 12 months    Recent Outpatient Visits            3 months ago Visual hallucinations   [place] [facility] [name], Netta Neat, DO   5 months ago Generalized anxiety disorder with panic attacks   [place] [facility] [place], Netta Neat, DO   5 months ago Generalized anxiety disorder with panic attacks   [place] [facility] [place], Netta Neat, DO   5 months ago Generalized anxiety disorder with panic attacks   [facility] [facility] Smitty Cords, DO   6 months ago Symptomatic anemia    [facility] [place] Althea Charon, Netta Neat, DO       Future Appointments             In 1 week Althea Charon, Netta Neat, DO  [facility], [facility]               Requested Prescriptions  Pending Prescriptions Disp Refills   meloxicam (MOBIC) 15 MG tablet      Sig: Take 1 tablet (15 mg total) by mouth daily.     Analgesics:  COX2 Inhibitors Failed - 12/20/2022 11:42 AM      Failed - Manual Review: Labs are only required if the patient has taken medication for more than 8 weeks.      Failed - HGB in normal range and within 360 days    Hemoglobin  Date Value Ref Range Status  10/23/2022 10.1 (L) 13.0 - 17.0 g/dL Final         Failed - HCT in normal range and within 360 days    HCT  Date Value Ref Range Status  10/23/2022 31.5 (L) 39.0 - 52.0 % Final         Passed - Cr in normal range and within 360 days    Creatinine, Ser  Date Value Ref Range Status  10/23/2022 0.81 0.61 - 1.24 mg/dL Final   Creatinine, Urine  Date Value Ref Range Status  12/13/2019 631 mg/dL Final    Comment:    RESULTS CONFIRMED BY MANUAL DILUTION Performed at [facility], [address], [place], [place] [address]          Passed - AST in normal range and within 360 days    AST  Date Value Ref Range Status  10/18/2022 24 15 - 41 U/L Final         Passed - ALT in normal range and within  360 days    ALT  Date Value Ref Range Status  10/18/2022 26 0 - 44 U/L Final         Passed - eGFR is 30 or above and within 360 days    GFR calc Af Amer  Date Value Ref Range Status  02/09/2020 >60 >60 mL/min Final   GFR, Estimated  Date Value Ref Range Status  10/23/2022 >60 >60 mL/min Final    Comment:    (NOTE) Calculated using the CKD-EPI Creatinine Equation (2021)          Passed - Patient is not pregnant      Passed - Valid encounter within last 12 months    Recent Outpatient Visits           3 months ago Visual hallucinations   [place] [facility] [place], Netta Neat, DO   5 months ago Generalized anxiety disorder with panic attacks   [facility] [place], Netta Neat, DO   5 months ago Generalized anxiety disorder with panic attacks   [place] [facility] [place], Netta Neat, DO   5 months ago Generalized anxiety disorder with panic attacks   [place] [facility] [place], Netta Neat, DO   6 months ago Symptomatic anemia   [place] [facility] Althea Charon, Netta Neat, DO       Future Appointments             In 1 week Althea Charon, Netta Neat, DO  [facility], [facility]

## 2022-12-20 NOTE — Patient Instructions (Signed)
____________________________________________________________________________________________  Opioid Pain Medication Update  To: All patients taking opioid pain medications. (I.e.: hydrocodone, hydromorphone, oxycodone, oxymorphone, morphine, codeine, methadone, tapentadol, tramadol, buprenorphine, fentanyl, etc.)  Re: Updated review of side effects and adverse reactions of opioid analgesics, as well as new information about long term effects of this class of medications.  Direct risks of long-term opioid therapy are not limited to opioid addiction and overdose. Potential medical risks include serious fractures, breathing problems during sleep, hyperalgesia, immunosuppression, chronic constipation, bowel obstruction, myocardial infarction, and tooth decay secondary to xerostomia.  Unpredictable adverse effects that can occur even if you take your medication correctly: Cognitive impairment, respiratory depression, and death. Most people think that if they take their medication "correctly", and "as instructed", that they will be safe. Nothing could be farther from the truth. In reality, a significant amount of recorded deaths associated with the use of opioids has occurred in individuals that had taken the medication for a long time, and were taking their medication correctly. The following are examples of how this can happen: Patient taking his/her medication for a long time, as instructed, without any side effects, is given a certain antibiotic or another unrelated medication, which in turn triggers a "Drug-to-drug interaction" leading to disorientation, cognitive impairment, impaired reflexes, respiratory depression or an untoward event leading to serious bodily harm or injury, including death.  Patient taking his/her medication for a long time, as instructed, without any side effects, develops an acute impairment of liver and/or kidney function. This will lead to a rapid inability of the body to  breakdown and eliminate their pain medication, which will result in effects similar to an "overdose", but with the same medicine and dose that they had always taken. This again may lead to disorientation, cognitive impairment, impaired reflexes, respiratory depression or an untoward event leading to serious bodily harm or injury, including death.  A similar problem will occur with patients as they grow older and their liver and kidney function begins to decrease as part of the aging process.  Background information: Historically, the original case for using long-term opioid therapy to treat chronic noncancer pain was based on safety assumptions that subsequent experience has called into question. In 1996, the American Pain Society and the American Academy of Pain Medicine issued a consensus statement supporting long-term opioid therapy. This statement acknowledged the dangers of opioid prescribing but concluded that the risk for addiction was low; respiratory depression induced by opioids was short-lived, occurred mainly in opioid-naive patients, and was antagonized by pain; tolerance was not a common problem; and efforts to control diversion should not constrain opioid prescribing. This has now proven to be wrong. Experience regarding the risks for opioid addiction, misuse, and overdose in community practice has failed to support these assumptions.  According to the Centers for Disease Control and Prevention, fatal overdoses involving opioid analgesics have increased sharply over the past decade. Currently, more than 96,700 people die from drug overdoses every year. Opioids are a factor in 7 out of every 10 overdose deaths. Deaths from drug overdose have surpassed motor vehicle accidents as the leading cause of death for individuals between the ages of 35 and 54.  Clinical data suggest that neuroendocrine dysfunction may be very common in both men and women, potentially causing hypogonadism, erectile  dysfunction, infertility, decreased libido, osteoporosis, and depression. Recent studies linked higher opioid dose to increased opioid-related mortality. Controlled observational studies reported that long-term opioid therapy may be associated with increased risk for cardiovascular events. Subsequent meta-analysis concluded   that the safety of long-term opioid therapy in elderly patients has not been proven.   Side Effects and adverse reactions: Common side effects: Drowsiness (sedation). Dizziness. Nausea and vomiting. Constipation. Physical dependence -- Dependence often manifests with withdrawal symptoms when opioids are discontinued or decreased. Tolerance -- As you take repeated doses of opioids, you require increased medication to experience the same effect of pain relief. Respiratory depression -- This can occur in healthy people, especially with higher doses. However, people with COPD, asthma or other lung conditions may be even more susceptible to fatal respiratory impairment.  Uncommon side effects: An increased sensitivity to feeling pain and extreme response to pain (hyperalgesia). Chronic use of opioids can lead to this. Delayed gastric emptying (the process by which the contents of your stomach are moved into your small intestine). Muscle rigidity. Immune system and hormonal dysfunction. Quick, involuntary muscle jerks (myoclonus). Arrhythmia. Itchy skin (pruritus). Dry mouth (xerostomia).  Long-term side effects: Chronic constipation. Sleep-disordered breathing (SDB). Increased risk of bone fractures. Hypothalamic-pituitary-adrenal dysregulation. Increased risk of overdose.  RISKS: Fractures and Falls:  Opioids increase the risk and incidence of falls. This is of particular importance in elderly patients.  Endocrine System:  Long-term administration is associated with endocrine abnormalities (endocrinopathies). (Also known as Opioid-induced Endocrinopathy) Influences  on both the hypothalamic-pituitary-adrenal axis?and the hypothalamic-pituitary-gonadal axis have been demonstrated with consequent hypogonadism and adrenal insufficiency in both sexes. Hypogonadism and decreased levels of dehydroepiandrosterone sulfate have been reported in men and women. Endocrine effects include: Amenorrhoea in women (abnormal absence of menstruation) Reduced libido in both sexes Decreased sexual function Erectile dysfunction in men Hypogonadisms (decreased testicular function with shrinkage of testicles) Infertility Depression and fatigue Loss of muscle mass Anxiety Depression Immune suppression Hyperalgesia Weight gain Anemia Osteoporosis Patients (particularly women of childbearing age) should avoid opioids. There is insufficient evidence to recommend routine monitoring of asymptomatic patients taking opioids in the long-term for hormonal deficiencies.  Immune System: Human studies have demonstrated that opioids have an immunomodulating effect. These effects are mediated via opioid receptors both on immune effector cells and in the central nervous system. Opioids have been demonstrated to have adverse effects on antimicrobial response and anti-tumour surveillance. Buprenorphine has been demonstrated to have no impact on immune function.  Opioid Induced Hyperalgesia: Human studies have demonstrated that prolonged use of opioids can lead to a state of abnormal pain sensitivity, sometimes called opioid induced hyperalgesia (OIH). Opioid induced hyperalgesia is not usually seen in the absence of tolerance to opioid analgesia. Clinically, hyperalgesia may be diagnosed if the patient on long-term opioid therapy presents with increased pain. This might be qualitatively and anatomically distinct from pain related to disease progression or to breakthrough pain resulting from development of opioid tolerance. Pain associated with hyperalgesia tends to be more diffuse than the  pre-existing pain and less defined in quality. Management of opioid induced hyperalgesia requires opioid dose reduction.  Cancer: Chronic opioid therapy has been associated with an increased risk of cancer among noncancer patients with chronic pain. This association was more evident in chronic strong opioid users. Chronic opioid consumption causes significant pathological changes in the small intestine and colon. Epidemiological studies have found that there is a link between opium dependence and initiation of gastrointestinal cancers. Cancer is the second leading cause of death after cardiovascular disease. Chronic use of opioids can cause multiple conditions such as GERD, immunosuppression and renal damage as well as carcinogenic effects, which are associated with the incidence of cancers.   Mortality: Long-term opioid use   has been associated with increased mortality among patients with chronic non-cancer pain (CNCP).  Prescription of long-acting opioids for chronic noncancer pain was associated with a significantly increased risk of all-cause mortality, including deaths from causes other than overdose.  Reference: Von Korff M, Kolodny A, Deyo RA, Chou R. Long-term opioid therapy reconsidered. Ann Intern Med. 2011 Sep 6;155(5):325-8. doi: 10.7326/[id number]. PMID: 21893626; PMCID: PMC3280085. Bedson J, Chen Y, Ashworth J, Hayward RA, Dunn KM, Jordan KP. Risk of adverse events in patients prescribed long-term opioids: A cohort study in the [place] Clinical Practice Research Datalink. Eur J Pain. 2019 May;23(5):908-922. doi: 10.1002/ejp.1357. Epub 2019 Jan 31. PMID: 30620116. Colameco S, Coren JS, Ciervo CA. Continuous opioid treatment for chronic noncancer pain: a time for moderation in prescribing. Postgrad Med. 2009 Jul;121(4):61-6. doi: 10.3810/pgm.2009.07.2032. PMID: 19641271. Chou R, Turner JA, Devine EB, Hansen RN, Sullivan SD, Blazina I, Dana T, Bougatsos C, Deyo RA. The  effectiveness and risks of long-term opioid therapy for chronic pain: a systematic review for a National Institutes of Health Pathways to Prevention Workshop. Ann Intern Med. 2015 Feb 17;162(4):276-86. doi: 10.7326/M14-2559. PMID: 25581257. Warner M, Chen LH, Makuc DM. NCHS Data Brief No. 22. [place]: Centers for Disease Control and Prevention; 2009. Sep, Increase in Fatal Poisonings Involving Opioid Analgesics in the [place], 1999-2006. Song IA, Choi HR, Oh TK. Long-term opioid use and mortality in patients with chronic non-cancer pain: Ten-year follow-up study in [place] from 2010 through 2019. EClinicalMedicine. 2022 Jul 18;51:101558. doi: 10.1016/j.eclinm.2022.101558. PMID: 35875817; PMCID: PMC9304910. Huser, W., Schubert, T., Vogelmann, T. et al. All-cause mortality in patients with long-term opioid therapy compared with non-opioid analgesics for chronic non-cancer pain: a database study. BMC Med 18, 162 (2020). https://doi.org/10.1186/[email or web address] Rashidian H, Zendehdel K, Kamangar F, Malekzadeh R, Haghdoost AA. An Ecological Study of the Association between Opiate Use and Incidence of Cancers. Addict Health. 2016 Fall;8(4):252-260. PMID: 28819556; PMCID: PMC5554805.  Our Goal: Our goal is to control your pain with means other than the use of opioid pain medications.  Our Recommendation: Talk to your physician about coming off of these medications. We can assist you with the tapering down and stopping these medicines. Based on the new information, even if you cannot completely stop the medication, a decrease in the dose may be associated with a lesser risk. Ask for other means of controlling the pain. Decrease or eliminate those factors that significantly contribute to your pain such as smoking, obesity, and a diet heavily tilted towards "inflammatory" nutrients.  Last Updated: 08/16/2022    ____________________________________________________________________________________________     ____________________________________________________________________________________________  Transfer of Pain Medication between Pharmacies  Re: 2023 DEA Clarification on existing regulation  Published on DEA Website: February 17, 2022  Title: Revised Regulation Allows DEA-Registered Pharmacies to Transfer Electronic Prescriptions at a Patient's Request DEA Headquarters Division - Public Information Office  "Patients now have the ability to request their electronic prescription be transferred to another pharmacy without having to go back to their practitioner to initiate the request. This revised regulation went into effect on Monday, February 13, 2022.     At a patient's request, a DEA-registered retail pharmacy can now transfer an electronic prescription for a controlled substance (schedules II-V) to another DEA-registered retail pharmacy. Prior to this change, patients would have to go through their practitioner to cancel their prescription and have it re-issued to a different pharmacy. The process was taxing and time consuming for both patients and practitioners.    The Drug Enforcement Administration (DEA) published its intent to revise the   process for transferring electronic prescriptions on May 07, 2020.  The final rule was published in the federal register on January 12, 2022 and went into effect 30 days later.  Under the final rule, a prescription can only be transferred once between pharmacies, and only if allowed under existing state or other applicable law. The prescription must remain in its electronic form; may not be altered in any way; and the transfer must be communicated directly between two licensed pharmacists. It's important to note, any authorized refills transfer with the original prescription, which means the entire prescription will be filled at the same pharmacy."     REFERENCES: 1. DEA website announcement https://www.dea.gov/stories/2023/2023-02/2022-09-01/revised-regulation-allows-dea-registered-pharmacies-transfer  2. Department of Justice website  https://www.govinfo.gov/content/pkg/FR-2022-01-12/pdf/2023-15847.pdf  3. DEPARTMENT OF JUSTICE Drug Enforcement Administration 21 CFR Part 1306 [Docket No. DEA-637] RIN [id number] "Transfer of Electronic Prescriptions for Schedules II-V Controlled Substances Between Pharmacies for Initial Filling"  ____________________________________________________________________________________________     _______________________________________________________________________  Medication Rules  Purpose: To inform patients, and their family members, of our medication rules and regulations.  Applies to: All patients receiving prescriptions from our practice (written or electronic).  Pharmacy of record: This is the pharmacy where your electronic prescriptions will be sent. Make sure we have the correct one.  Electronic prescriptions: In compliance with the [place] Strengthen Opioid Misuse Prevention (STOP) Act of 2017 (Session Law 2017-74/H243), effective June 19, 2018, all controlled substances must be electronically prescribed. Written prescriptions, faxing, or calling prescriptions to a pharmacy will no longer be done.  Prescription refills: These will be provided only during in-person appointments. No medications will be renewed without a "face-to-face" evaluation with your provider. Applies to all prescriptions.  NOTE: The following applies primarily to controlled substances (Opioid* Pain Medications).   Type of encounter (visit): For patients receiving controlled substances, face-to-face visits are required. (Not an option and not up to the patient.)  Patient's responsibilities: Pain Pills: Bring all pain pills to every appointment (except for procedure appointments). Pill Bottles: Bring  pills in original pharmacy bottle. Bring bottle, even if empty. Always bring the bottle of the most recent fill.  Medication refills: You are responsible for knowing and keeping track of what medications you are taking and when is it that you will need a refill. The day before your appointment: write a list of all prescriptions that need to be refilled. The day of the appointment: give the list to the admitting nurse. Prescriptions will be written only during appointments. No prescriptions will be written on procedure days. If you forget a medication: it will not be "Called in", "Faxed", or "electronically sent". You will need to get another appointment to get these prescribed. No early refills. Do not call asking to have your prescription filled early. Partial  or short prescriptions: Occasionally your pharmacy may not have enough pills to fill your prescription.  NEVER ACCEPT a partial fill or a prescription that is short of the total amount of pills that you were prescribed.  With controlled substances the law allows 72 hours for the pharmacy to complete the prescription.  If the prescription is not completed within 72 hours, the pharmacist will require a new prescription to be written. This means that you will be short on your medicine and we WILL NOT send another prescription to complete your original prescription.  Instead, request the pharmacy to send a carrier to a nearby branch to get enough medication to provide you with your full prescription. Prescription Accuracy: You are responsible for carefully inspecting your   prescriptions before leaving our office. Have the discharge nurse carefully go over each prescription with you, before taking them home. Make sure that your name is accurately spelled, that your address is correct. Check the name and dose of your medication to make sure it is accurate. Check the number of pills, and the written instructions to make sure they are clear and accurate. Make  sure that you are given enough medication to last until your next medication refill appointment. Taking Medication: Take medication as prescribed. When it comes to controlled substances, taking less pills or less frequently than prescribed is permitted and encouraged. Never take more pills than instructed. Never take the medication more frequently than prescribed.  Inform other Doctors: Always inform, all of your healthcare providers, of all the medications you take. Pain Medication from other Providers: You are not allowed to accept any additional pain medication from any other Doctor or Healthcare provider. There are two exceptions to this rule. (see below) In the event that you require additional pain medication, you are responsible for notifying us, as stated below. Cough Medicine: Often these contain an opioid, such as codeine or hydrocodone. Never accept or take cough medicine containing these opioids if you are already taking an opioid* medication. The combination may cause respiratory failure and death. Medication Agreement: You are responsible for carefully reading and following our Medication Agreement. This must be signed before receiving any prescriptions from our practice. Safely store a copy of your signed Agreement. Violations to the Agreement will result in no further prescriptions. (Additional copies of our Medication Agreement are available upon request.) Laws, Rules, & Regulations: All patients are expected to follow all Federal and State Laws, Statutes, Rules, & Regulations. Ignorance of the Laws does not constitute a valid excuse.  Illegal drugs and Controlled Substances: The use of illegal substances (including, but not limited to marijuana and its derivatives) and/or the illegal use of any controlled substances is strictly prohibited. Violation of this rule may result in the immediate and permanent discontinuation of any and all prescriptions being written by our practice. The use of  any illegal substances is prohibited. Adopted CDC guidelines & recommendations: Target dosing levels will be at or below 60 MME/day. Use of benzodiazepines** is not recommended.  Exceptions: There are only two exceptions to the rule of not receiving pain medications from other Healthcare Providers. Exception #1 (Emergencies): In the event of an emergency (i.e.: accident requiring emergency care), you are allowed to receive additional pain medication. However, you are responsible for: As soon as you are able, call our office [phone], at any time of the day or night, and leave a message stating your name, the date and nature of the emergency, and the name and dose of the medication prescribed. In the event that your call is answered by a member of our staff, make sure to document and save the date, time, and the name of the person that took your information.  Exception #2 (Planned Surgery): In the event that you are scheduled by another doctor or dentist to have any type of surgery or procedure, you are allowed (for a period no longer than 30 days), to receive additional pain medication, for the acute post-op pain. However, in this case, you are responsible for picking up a copy of our "Post-op Pain Management for Surgeons" handout, and giving it to your surgeon or dentist. This document is available at our office, and does not require an appointment to obtain it. Simply go to   our office during business hours (Monday-Thursday from 8:00 AM to 4:00 PM) (Friday 8:00 AM to 12:00 Noon) or if you have a scheduled appointment with us, prior to your surgery, and ask for it by name. In addition, you are responsible for: calling our office [phone], at any time of the day or night, and leaving a message stating your name, name of your surgeon, type of surgery, and date of procedure or surgery. Failure to comply with your responsibilities may result in termination of therapy involving the controlled  substances. Medication Agreement Violation. Following the above rules, including your responsibilities will help you in avoiding a Medication Agreement Violation ("Breaking your Pain Medication Contract").  Consequences:  Not following the above rules may result in permanent discontinuation of medication prescription therapy.  *Opioid medications include: morphine, codeine, oxycodone, oxymorphone, hydrocodone, hydromorphone, meperidine, tramadol, tapentadol, buprenorphine, fentanyl, methadone. **Benzodiazepine medications include: diazepam (Valium), alprazolam (Xanax), clonazepam (Klonopine), lorazepam (Ativan), clorazepate (Tranxene), chlordiazepoxide (Librium), estazolam (Prosom), oxazepam (Serax), temazepam (Restoril), triazolam (Halcion) (Last updated: 04/11/2022) ______________________________________________________________________    ______________________________________________________________________  Medication Recommendations and Reminders  Applies to: All patients receiving prescriptions (written and/or electronic).  Medication Rules & Regulations: You are responsible for reading, knowing, and following our "Medication Rules" document. These exist for your safety and that of others. They are not flexible and neither are we. Dismissing or ignoring them is an act of "non-compliance" that may result in complete and irreversible termination of such medication therapy. For safety reasons, "non-compliance" will not be tolerated. As with the [place] fundamental legal principle of "ignorance of the law is no defense", we will accept no excuses for not having read and knowing the content of documents provided to you by our practice.  Pharmacy of record:  Definition: This is the pharmacy where your electronic prescriptions will be sent.  We do not endorse any particular pharmacy. It is up to you and your insurance to decide what pharmacy to use.  We do not restrict you in your choice of  pharmacy. However, once we write for your prescriptions, we will NOT be re-sending more prescriptions to fix restricted supply problems created by your pharmacy, or your insurance.  The pharmacy listed in the electronic medical record should be the one where you want electronic prescriptions to be sent. If you choose to change pharmacy, simply notify our nursing staff. Changes will be made only during your regular appointments and not over the phone.  Recommendations: Keep all of your pain medications in a safe place, under lock and key, even if you live alone. We will NOT replace lost, stolen, or damaged medication. We do not accept "Police Reports" as proof of medications having been stolen. After you fill your prescription, take 1 week's worth of pills and put them away in a safe place. You should keep a separate, properly labeled bottle for this purpose. The remainder should be kept in the original bottle. Use this as your primary supply, until it runs out. Once it's gone, then you know that you have 1 week's worth of medicine, and it is time to come in for a prescription refill. If you do this correctly, it is unlikely that you will ever run out of medicine. To make sure that the above recommendation works, it is very important that you make sure your medication refill appointments are scheduled at least 1 week before you run out of medicine. To do this in an effective manner, make sure that you do not leave the office without   scheduling your next medication management appointment. Always ask the nursing staff to show you in your prescription , when your medication will be running out. Then arrange for the receptionist to get you a return appointment, at least 7 days before you run out of medicine. Do not wait until you have 1 or 2 pills left, to come in. This is very poor planning and does not take into consideration that we may need to cancel appointments due to bad weather, sickness, or emergencies  affecting our staff. DO NOT ACCEPT A "Partial Fill": If for any reason your pharmacy does not have enough pills/tablets to completely fill or refill your prescription, do not allow for a "partial fill". The law allows the pharmacy to complete that prescription within 72 hours, without requiring a new prescription. If they do not fill the rest of your prescription within those 72 hours, you will need a separate prescription to fill the remaining amount, which we will NOT provide. If the reason for the partial fill is your insurance, you will need to talk to the pharmacist about payment alternatives for the remaining tablets, but again, DO NOT ACCEPT A PARTIAL FILL, unless you can trust your pharmacist to obtain the remainder of the pills within 72 hours.  Prescription refills and/or changes in medication(s):  Prescription refills, and/or changes in dose or medication, will be conducted only during scheduled medication management appointments. (Applies to both, written and electronic prescriptions.) No refills on procedure days. No medication will be changed or started on procedure days. No changes, adjustments, and/or refills will be conducted on a procedure day. Doing so will interfere with the diagnostic portion of the procedure. No phone refills. No medications will be "called into the pharmacy". No Fax refills. No weekend refills. No Holliday refills. No after hours refills.  Remember:  Business hours are:  Monday to Thursday 8:00 AM to 4:00 PM Provider's Schedule: Dejion Grillo, MD - Appointments are:  Medication management: Monday and Wednesday 8:00 AM to 4:00 PM Procedure day: Tuesday and Thursday 7:30 AM to 4:00 PM Bilal Lateef, MD - Appointments are:  Medication management: Tuesday and Thursday 8:00 AM to 4:00 PM Procedure day: Monday and Wednesday 7:30 AM to 4:00 PM (Last update: 04/11/2022) ______________________________________________________________________    ____________________________________________________________________________________________  Naloxone Nasal Spray  Why am I receiving this medication? [place] STOP ACT requires that all patients taking high dose opioids or at risk of opioids respiratory depression, be prescribed an opioid reversal agent, such as Naloxone (AKA: Narcan).  What is this medication? NALOXONE (nal OX one) treats opioid overdose, which causes slow or shallow breathing, severe drowsiness, or trouble staying awake. Call emergency services after using this medication. You may need additional treatment. Naloxone works by reversing the effects of opioids. It belongs to a group of medications called opioid blockers.  COMMON BRAND NAME(S): Kloxxado, Narcan  What should I tell my care team before I take this medication? They need to know if you have any of these conditions: Heart disease Substance use disorder An unusual or allergic reaction to naloxone, other medications, foods, dyes, or preservatives Pregnant or trying to get pregnant Breast-feeding  When to use this medication? This medication is to be used for the treatment of respiratory depression (less than 8 breaths per minute) secondary to opioid overdose.   How to use this medication? This medication is for use in the nose. Lay the person on their back. Support their neck with your hand and allow the head to tilt   back before giving the medication. The nasal spray should be given into 1 nostril. After giving the medication, move the person onto their side. Do not remove or test the nasal spray until ready to use. Get emergency medical help right away after giving the first dose of this medication, even if the person wakes up. You should be familiar with how to recognize the signs and symptoms of a narcotic overdose. If more doses are needed, give the additional dose in the other nostril. Talk to your care team about the use of this medication in children.  While this medication may be prescribed for children as young as newborns for selected conditions, precautions do apply.  Naloxone Overdosage: If you think you have taken too much of this medicine contact a poison control center or emergency room at once.  NOTE: This medicine is only for you. Do not share this medicine with others.  What if I miss a dose? This does not apply.  What may interact with this medication? This is only used during an emergency. No interactions are expected during emergency use. This list may not describe all possible interactions. Give your health care provider a list of all the medicines, herbs, non-prescription drugs, or dietary supplements you use. Also tell them if you smoke, drink alcohol, or use illegal drugs. Some items may interact with your medicine.  What should I watch for while using this medication? Keep this medication ready for use in the case of an opioid overdose. Make sure that you have the phone number of your care team and local hospital ready. You may need to have additional doses of this medication. Each nasal spray contains a single dose. Some emergencies may require additional doses. After use, bring the treated person to the nearest hospital or call 911. Make sure the treating care team knows that the person has received a dose of this medication. You will receive additional instructions on what to do during and after use of this medication before an emergency occurs.  What side effects may I notice from receiving this medication? Side effects that you should report to your care team as soon as possible: Allergic reactions--skin rash, itching, hives, swelling of the face, lips, tongue, or throat Side effects that usually do not require medical attention (report these to your care team if they continue or are bothersome): Constipation Dryness or irritation inside the nose Headache Increase in blood pressure Muscle spasms Stuffy  nose Toothache This list may not describe all possible side effects. Call your doctor for medical advice about side effects. You may report side effects to FDA at 1-800-FDA-1088.  Where should I keep my medication? Because this is an emergency medication, you should keep it with you at all times.  Keep out of the reach of children and pets. Store between 20 and 25 degrees C (68 and 77 degrees F). Do not freeze. Throw away any unused medication after the expiration date. Keep in original box until ready to use.  NOTE: This sheet is a summary. It may not cover all possible information. If you have questions about this medicine, talk to your doctor, pharmacist, or health care provider.   2023 Elsevier/Gold Standard (2021-02-11 00:00:00)  ____________________________________________________________________________________________   

## 2022-12-20 NOTE — Telephone Encounter (Signed)
Medication Refill - Medication:  meloxicam (MOBIC) 15 MG tablet   Has the patient contacted their pharmacy? Yes.   ( Preferred Pharmacy (with phone number or street name):  [id number] - [place], [place] - [address] AT [facility] [address]  [address] [place] [address]  Phone: [phone] Fax: [phone]  Hours: Not open 24 hours      Has the patient been seen for an appointment in the last year OR does the patient have an upcoming appointment? Yes.    Agent: Please be advised that RX refills may take up to 3 business days. We ask that you follow-up with your pharmacy.

## 2022-12-20 NOTE — Telephone Encounter (Signed)
I attempted to contact the patient to schedule him for a medical clearance appointment. No answer,I left a message on the patient personal voicemail to return my call.

## 2022-12-25 ENCOUNTER — Telehealth: Payer: Self-pay | Admitting: Pain Medicine

## 2022-12-25 ENCOUNTER — Other Ambulatory Visit: Payer: Self-pay | Admitting: Pain Medicine

## 2022-12-25 ENCOUNTER — Telehealth: Payer: Self-pay

## 2022-12-25 NOTE — Telephone Encounter (Signed)
Copied from CRM [id number]. Topic: General - Other >> Dec 25, 2022  9:32 AM Epimenio Foot F wrote: Reason for CRM: Wynona Dove with [facility] is calling in to verify if Dr. Althea Charon will sign off on orders. Normajean Baxter says they received a referral from [employer] for skilled nursing, OT, and PT. Davene can be reached at [phone]

## 2022-12-25 NOTE — Telephone Encounter (Signed)
Okay to sign orders  Thanks  Saralyn Pilar, DO [facility] 12/25/2022, 11:30 AM

## 2022-12-25 NOTE — Telephone Encounter (Signed)
PT daughter called stated that the reason the patient missed his appt on 12-20-22. PT was in hospital 10-10-22 thur 10-30-22 than patient was send to rehab 10-30-22 thur 12-18-22. Patient daughter states that while he was there they were giving him oxycodone. PT hasn't been giving any other medications from any providers. PT daughter stated that she is okay with the injections however until his neck is heal she wanted to see about medications , patient was sent home with pills and only have 4 pills left. PT daughter states that if Dr. Laban Emperor can't , PT daughter wants to know will it be okay to see if his [facility] doctor will give patient medication.

## 2022-12-25 NOTE — Progress Notes (Unsigned)
On 12/20/2022 he was a NO-SHOW to medication management evaluation.  According to my evaluation of the PMP, he is no longer using the medications I prescribe. He has been getting them from other providers. I am not planning on prescribing them anymore. I will remain available for interventional therapies, but once he gets them from someone else, I will not prescribe again.  PMP: According to my review of the PMP, on 12/16/2022 the patient was written a prescription (RX# [id number]) for buprenorphine 10 mcg/h patch (#4) which was filled on 12/19/2022 at [facility] ([address], [place] [address]).  This prescription was written by Abbie Sons, AGNP-C ([address], [place], [place] [address]) (Tel.: [phone]).  In addition to this the patient also received another prescription for gabapentin 300 mg capsule (#60) to be taken BID, and a prescription for oxycodone IR 10 mg tablet (#20) to be taken QID.  All 3 prescriptions were written on 12/16/2022 and the last 2 were filled on 12/16/2022.

## 2022-12-25 NOTE — Telephone Encounter (Signed)
Bring him, in for an appointment asap, Like Wednesday.  He told me we might have to double book wince he's gonna be out some.

## 2022-12-25 NOTE — Telephone Encounter (Signed)
I called and spoke with Davene from [facility] and informed her that Dr. Kirtland Bouchard will sign off on the orders when he receive the faxed form.

## 2022-12-26 ENCOUNTER — Telehealth: Payer: Self-pay

## 2022-12-26 NOTE — Telephone Encounter (Signed)
No. On 12/20/2022 he was a NO-SHOW to medication management evaluation. According to my evaluation of the PMP, he is no longer using the medications I prescribe. He has been getting them from other providers. I am not planning on prescribing them anymore. I will remain available for interventional therapies, but once he gets them from someone else, I will not prescribe again.   PT daughter called stated that the reason the patient missed his appt on 12-20-22. PT was in hospital 10-10-22 thur 10-30-22 than patient was send to rehab 10-30-22 thur 12-18-22. Patient daughter states that while he was there they were giving him oxycodone. PT hasn't been giving any other medications from any providers. PT daughter stated that she is okay with the injections however until his neck is heal she wanted to see about medications , patient was sent home with pills and only have 4 pills left. PT daughter states that if Dr. Laban Emperor can't , PT daughter wants to know will it be okay to see if his [facility] doctor will give patient medication.     This is not accurate. You may want to call her back and confront her with the following information.Marland KitchenMarland KitchenPMP: According to my review of the PMP, on 12/16/2022 the patient was written a prescription (RX# [id number]) for buprenorphine 10 mcg/h patch (#4) which was filled on 12/19/2022 at [facility] ([address], [place] [address]). This prescription was written by Abbie Sons, AGNP-C ([address] [place], [place] [address]) (Tel.: [phone]). In addition to this the patient also received another prescription for gabapentin 300 mg capsule (#60) to be taken BID, and a prescription for oxycodone IR 10 mg tablet (#20) to be taken QID. All 3 prescriptions were written on 12/16/2022 and the last 2 were filled on 12/16/2022.  Spoke with patients daughter to reinform her of above.  The daughter states she has an appointment with him tomorrow.

## 2022-12-26 NOTE — Progress Notes (Unsigned)
PROVIDER NOTE: Information contained herein reflects review and annotations entered in association with encounter. Interpretation of such information and data should be left to medically-trained personnel. Information provided to patient can be located elsewhere in the medical record under "Patient Instructions". Document created using STT-dictation technology, any transcriptional errors that may result from process are unintentional.    Patient: Aaron Gibbs  Service Category: E/M  Provider: Oswaldo Done, MD  DOB: October 07, 1934  DOS: 12/27/2022  Referring Provider: Saralyn Gibbs *  MRN: [id number]  Specialty: Interventional Pain Management  PCP: Aaron Cords, DO  Type: Established Patient  Setting: Ambulatory outpatient    Location: Office  Delivery: Face-to-face     HPI  [name], a 88 y.o. year old male, is here today because of his Chronic pain syndrome [G89.4]. [name] primary complain today is No chief complaint on file.  Pertinent problems: [name] has Chronic pain syndrome; DISH (diffuse idiopathic skeletal hyperostosis); Osteoarthritis of multiple joints; Chronic knee pain (1ry area of Pain) (Bilateral) (L>R); Chronic low back pain (Bilateral) w/o sciatica; Somatic symptom disorder; Tricompartment osteoarthritis of knee (Left); Osteoarthritis of knee (Bilateral); Osteoarthritis of patellofemoral joint (Right); Neurogenic pain; Lateral meniscal tear, sequela (Left); Medial meniscal tear, sequela (Left); Patellar tendinosis (Right); Osteoarthritis of knee (Left); Abnormal MRI, knee (08/05/2018) (Left); Chronic knee pain (Left); Pain in right knee; and Other chronic pain on their pertinent problem list. Pain Assessment: Severity of   is reported as a  /10. Location:    / . Onset:  . Quality:  . Timing:  . Modifying factor(s):  Marland Kitchen Vitals:  vitals were not taken for this visit.  BMI: Estimated body mass index is 29.29 kg/m as calculated from the  following:   Height as of 11/18/22: 5\' 11"  (1.803 m).   Weight as of 11/18/22: 210 lb (95.3 kg). Last encounter: 12/20/2022. Last procedure: 09/07/2022.  Reason for encounter: medication management. ***  The last set of buprenorphine Lavera Guise) that I wrote for this patient was on 09/28/2022, at which time the patient was provided with 3 prescriptions to last until 12/27/2022.  Recent UDS conducted on 09/28/2022 indicated the patient to have no buprenorphine in the system.  Before 09/28/2022 he had 3 prescriptions written and accounted for on the PMP from 03/29/2022.  The next time that the buprenorphine was filled was on 05/24/2022 at which time he received 3 prescriptions but only 2 of those are accounted for in the PMP.  The last time that he had it filled was on 08/30/2022 at which time he received 4 patches which should have lasted until 09/27/2022.  UDS Gibbs on 09/28/2022 showed no buprenorphine only 1 day after he was supposed to take the last patch off.  The medication section on the electronic medical record indicates the patient to have received a prescription for oxycodone IR 10 mg (# 10) on 10/30/2022 by Aaron Hawking, MD.    PMP: The patient lives at an assisted living facility.  The last time that he had the buprenorphine reported as filled was on 08/30/2022 from a prescription written on 03/29/2022.  Since then, he had a prescription for oxycodone 10 mg filled on 12/16/2022, also written on 12/16/2022 by Aaron Gibbs (Adult-Gerontology Primary Care Nurse Practitioner) [facility]).   Based on the above information, it is clear to me that the patient is no longer using the buprenorphine and therefore there is no need for [place] to continue seeing him for medication management.  We will  remain available to him for interventional techniques should they be necessary.  We will see him on a as needed basis.  RTCB:    Pharmacotherapy Assessment  Analgesic: No opioid analgesics prescribed  by our practice.  Buprenorphine 10 mcg/h patch q. 7 days.  High risk for SUD.  History of suicidal attempts and noncompliance with medication intake. ED visits thought to be due to medication binging. MME/day: 0 mg/day.   Monitoring: [place] PMP: PDMP not reviewed this encounter.       Pharmacotherapy: No side-effects or adverse reactions reported. Compliance: No problems identified. Effectiveness: Clinically acceptable.  No notes on file  No results found for: "CBDTHCR" No results found for: "D8THCCBX" No results found for: "D9THCCBX"  UDS:  Summary  Date Value Ref Range Status  09/28/2022 Note  Final    Comment:    ==================================================================== [employer] Select 13 (MW) ==================================================================== Test                             Result       Flag       Units  Drug Present and Declared for Prescription Verification   Lorazepam                      714          EXPECTED   ng/mg creat    Source of lorazepam is a scheduled prescription medication.  Drug Absent but Declared for Prescription Verification   Buprenorphine                  Not Detected UNEXPECTED ng/mg creat    Transdermal buprenorphine, as indicated in the declared medication    list, is not always detected even when used as directed.  ==================================================================== Test                      Result    Flag   Units      Ref Range   Creatinine              108              mg/dL      >=16 ==================================================================== Declared Medications:  The flagging and interpretation on this report are based on the  following declared medications.  Unexpected results may arise from  inaccuracies in the declared medications.   **Note: The testing scope of this panel includes these medications:   Lorazepam (Ativan)   **Note: The testing scope of this panel does not include  small to  moderate amounts of these reported medications:   Buprenorphine Patch (BuTrans)   **Note: The testing scope of this panel does not include the  following reported medications:   Acetaminophen (Tylenol)  Betamethasone (Lotrisone)  Bumetanide (Bumex)  Carvedilol (Coreg)  Clotrimazole (Lotrisone)  Diclofenac (Voltaren)  Dorzolamide  Duloxetine (Cymbalta)  Finasteride (Proscar)  Gabapentin (Neurontin)  Iron  Latanoprost (Xalatan)  Melatonin  Meloxicam (Mobic)  Mupirocin (Bactroban)  Nystatin (Mycostatin)  Olanzapine (Zyprexa)  Pantoprazole (Protonix)  Polyethylene Glycol  Tamsulosin (Flomax)  Timolol  Trazodone (Desyrel) ==================================================================== For clinical consultation, please call [phone]. ====================================================================       ROS  Constitutional: Denies any fever or chills Gastrointestinal: No reported hemesis, hematochezia, vomiting, or acute GI distress Musculoskeletal: Denies any acute onset joint swelling, redness, loss of ROM, or weakness Neurological: No reported episodes of acute onset apraxia, aphasia, dysarthria, agnosia, amnesia, paralysis,  loss of coordination, or loss of consciousness  Medication Review  DULoxetine, Iron (Ferrous Sulfate), Melatonin, OLANZapine, Oxycodone HCl, acetaminophen, albuterol, bumetanide, buprenorphine, carvedilol, diclofenac Sodium, dorzolamide-timolol, feeding supplement, finasteride, gabapentin, latanoprost, meloxicam, pantoprazole, polyethylene glycol, tamsulosin, and traZODone  History Review  Allergy: [name] is allergic to anthralin and azithromycin. Drug: [name]  reports no history of drug use. Alcohol:  reports that he does not currently use alcohol. Tobacco:  reports that he has quit smoking. His smoking use included cigarettes. His smokeless tobacco use includes chew. Social: [name]  reports that he has  quit smoking. His smoking use included cigarettes. His smokeless tobacco use includes chew. He reports that he does not currently use alcohol. He reports that he does not use drugs. Medical:  has a past medical history of Anxiety, Cancer (HCC), COPD (chronic obstructive pulmonary disease) (HCC), Glaucoma, Osteoporosis, and Suicide attempt (HCC) (05/13/18) (06/26/2018). Surgical: [name]  has a past surgical history that includes Cholecystectomy and Transurethral resection of prostate (N/A, 2008). Family: family history includes Depression in his mother; Heart disease in his father.  Laboratory Chemistry Profile   Renal Lab Results  Component Value Date   BUN 13 10/23/2022   CREATININE 0.81 10/23/2022   LABCREA 631 12/13/2019   BCR 28 (H) 07/10/2018   GFRAA >60 02/09/2020   GFRNONAA >60 10/23/2022    Hepatic Lab Results  Component Value Date   AST 24 10/18/2022   ALT 26 10/18/2022   ALBUMIN 2.6 (L) 10/18/2022   ALKPHOS 41 10/18/2022   LIPASE 20 10/10/2022   AMMONIA 25 09/13/2019    Electrolytes Lab Results  Component Value Date   NA 141 10/23/2022   K 3.8 10/23/2022   CL 112 (H) 10/23/2022   CALCIUM 8.7 (L) 10/23/2022   MG 2.2 10/23/2022   PHOS 3.4 03/30/2019    Bone Lab Results  Component Value Date   VD25OH 26 08/15/2016   25OHVITD1 34 07/10/2018   25OHVITD2 <1.0 07/10/2018   25OHVITD3 34 07/10/2018    Inflammation (CRP: Acute Phase) (ESR: Chronic Phase) Lab Results  Component Value Date   CRP 5 07/10/2018   ESRSEDRATE 20 07/10/2018   LATICACIDVEN 2.7 (HH) 10/17/2022         Note: Above Lab results reviewed.  Recent Imaging Review  DG Chest Portable 1 View CLINICAL DATA:  Fall  EXAM: PORTABLE CHEST 1 VIEW  COMPARISON:  Chest x-ray 10/17/2022.  CT of the chest 10/10/2022.  FINDINGS: The heart size and mediastinal contours are within normal limits. There is a new focal opacity in the left mid lung measuring 3.4 x 2.5 cm, indeterminate. There is no  pleural effusion or pneumothorax. No acute fractures are seen.  IMPRESSION: 1. New focal opacity in the left mid lung measuring 3.4 x 2.5 cm, indeterminate. Recommend further evaluation with chest CT. 2. No acute fractures are seen.  Electronically Signed   By: Darliss Cheney M.D.   On: 11/18/2022 23:36 CT Cervical Spine Wo Contrast CLINICAL DATA:  Neck trauma (Age >= 65y)  Recent C2 fracture.  EXAM: CT CERVICAL SPINE WITHOUT CONTRAST  TECHNIQUE: Multidetector CT imaging of the cervical spine was performed without intravenous contrast. Multiplanar CT image reconstructions were also generated.  RADIATION DOSE REDUCTION: This exam was performed according to the departmental dose-optimization program which includes automated exposure control, adjustment of the mA and/or kV according to patient size and/or use of iterative reconstruction technique.  COMPARISON:  CT 10/17/2022  FINDINGS: Alignment: Exaggerated cervical kyphosis and associated  straightening of cervical lordosis.  Skull base and vertebrae: Mildly displaced type 2 dens fracture extending to the right lateral mass. No significant change from prior exam. There is no solid bony bridging. There may be some peripheral callus formation. No new or additional fracture. Diffuse flowing anterior osteophytes throughout the cervical and included upper thoracic spine.  Soft tissues and spinal canal: No definite canal hematoma related to C2 fracture. Mild thickening of the prevertebral soft tissues at the level of fracture.  Disc levels: Stable multilevel degenerative change with diffuse flowing anterior osteophytes and facet ankylosis.  Upper chest: No acute findings.  Other: None.  IMPRESSION: 1. Mildly displaced type 2 dens fracture extending to the right lateral mass without significant change from prior exam. There is no solid bony bridging. There may be some peripheral callus formation. 2. No new fracture. 3.  Diffuse flowing anterior osteophytes and facet ankylosis.  Electronically Signed   By: Narda Rutherford M.D.   On: 11/18/2022 23:34 CT Head Wo Contrast CLINICAL DATA:  Head trauma, minor (Age >= 65y)  Recent cervical spine fracture.  EXAM: CT HEAD WITHOUT CONTRAST  TECHNIQUE: Contiguous axial images were obtained from the base of the skull through the vertex without intravenous contrast.  RADIATION DOSE REDUCTION: This exam was performed according to the departmental dose-optimization program which includes automated exposure control, adjustment of the mA and/or kV according to patient size and/or use of iterative reconstruction technique.  COMPARISON:  Head CT 10/17/2022  FINDINGS: Brain: No intracranial hemorrhage, mass effect, or midline shift. Age related atrophy. No hydrocephalus. The basilar cisterns are patent. Stable degree of chronic small vessel ischemia. No evidence of territorial infarct or acute ischemia. No extra-axial or intracranial fluid collection.  Vascular: Atherosclerosis of skullbase vasculature without hyperdense vessel or abnormal calcification.  Skull: No fracture or focal lesion.  Sinuses/Orbits: Chronic mucosal thickening of the paranasal sinuses. No acute findings. No mastoid effusion.  Other: None.  IMPRESSION: 1. No acute intracranial abnormality. No skull fracture. 2. Age related atrophy and chronic small vessel ischemia.  Electronically Signed   By: Narda Rutherford M.D.   On: 11/18/2022 23:28 Note: Reviewed        Physical Exam  General appearance: Well nourished, well developed, and well hydrated. In no apparent acute distress Mental status: Alert, oriented x 3 (person, place, & time)       Respiratory: No evidence of acute respiratory distress Eyes: PERLA Vitals: There were no vitals taken for this visit. BMI: Estimated body mass index is 29.29 kg/m as calculated from the following:   Height as of 11/18/22: 5\' 11"  (1.803 m).    Weight as of 11/18/22: 210 lb (95.3 kg). Ideal: Patient weight not recorded  Assessment   Diagnosis Status  1. Chronic pain syndrome   2. Chronic knee pain (1ry area of Pain) (Bilateral) (L>R)   3. Chronic low back pain (Bilateral) w/o sciatica   4. DISH (diffuse idiopathic skeletal hyperostosis)   5. Osteoarthritis of knee (Left)   6. Chronic knee pain (Left)   7. Lateral meniscal tear, sequela (Left)   8. Medial meniscal tear, sequela (Left)   9. Osteoarthritis of knee (Bilateral)   10. Osteoarthritis of patellofemoral joint (Right)   11. Tricompartment osteoarthritis of knee (Left)   12. Patellar tendinosis (Right)   13. Pharmacologic therapy   14. Chronic use of opiate for therapeutic purpose   15. Encounter for medication management   16. Encounter for chronic pain management    Controlled Controlled  Controlled   Updated Problems: No problems updated.  Plan of Care  Problem-specific:  No problem-specific Assessment & Plan notes found for this encounter.  [name] has a current medication list which includes the following long-term medication(s): bumetanide, buprenorphine, carvedilol, diclofenac sodium, duloxetine, gabapentin, iron (ferrous sulfate), olanzapine, pantoprazole, and trazodone.  Pharmacotherapy (Medications Ordered): No orders of the defined types were placed in this encounter.  Orders:  No orders of the defined types were placed in this encounter.  Follow-up plan:   No follow-ups on file.      Interventional Therapies  Risk Factors  Considerations:  Patient is legally blind. High risk for substance use disorder.  The patient has documented suicidal attempt using medications.  In addition, we have documented the patient to have a binge use pattern and been noncompliant with the instructions on how to use the medications.  This is a reason why went to a buprenorphine patch.   Planned  Pending:   Palliative left Knee genicular nerve RFA  #5    Under consideration:   Palliative bilateral genicular nerve RFA maintenance treatments   Completed:   Therapeutic bilateral IA steroid knee inj. (09/07/2022) (100/100/20/0)  Palliative bilateral IA Hyalgan knee injections Sx2  Diagnostic bilateral genicular NB x1  Palliative right genicular nerve RFA x2 (04/06/2020) (100/100/50/100)  Palliative left genicular nerve RFA x4 (04/18/2022) (100/100/30/40)    Completed by other providers:   N/A   Therapeutic  Palliative (PRN) options:   Palliative bilateral IA Hyalgan knee injections S3/N1  Diagnostic bilateral genicular NB #2  Palliative bilateral genicular nerve RFA #3    Pharmacotherapy  Nonopioid transferred 11/02/2020: Voltaren gel       Recent Visits Date Type Provider Dept  09/28/22 Office Visit Delano Metz, MD [facility]  Showing recent visits within past 90 days and meeting all other requirements Future Appointments Date Type Provider Dept  12/27/22 Appointment Delano Metz, MD [facility]  Showing future appointments within next 90 days and meeting all other requirements  I discussed the assessment and treatment plan with the patient. The patient was provided an opportunity to ask questions and all were answered. The patient agreed with the plan and demonstrated an understanding of the instructions.  Patient advised to call back or seek an in-person evaluation if the symptoms or condition worsens.  Duration of encounter: *** minutes.  Total time on encounter, as per AMA guidelines included both the face-to-face and non-face-to-face time personally spent by the physician and/or other qualified health care professional(s) on the day of the encounter (includes time in activities that require the physician or other qualified health care professional and does not include time in activities normally performed by clinical staff). Physician's time may include the following activities when  performed: Preparing to see the patient (e.g., pre-charting review of records, searching for previously ordered imaging, lab work, and nerve conduction tests) Review of prior analgesic pharmacotherapies. Reviewing PMP Interpreting ordered tests (e.g., lab work, imaging, nerve conduction tests) Performing post-procedure evaluations, including interpretation of diagnostic procedures Obtaining and/or reviewing separately obtained history Performing a medically appropriate examination and/or evaluation Counseling and educating the patient/family/caregiver Ordering medications, tests, or procedures Referring and communicating with other health care professionals (when not separately reported) Documenting clinical information in the electronic or other health record Independently interpreting results (not separately reported) and communicating results to the patient/ family/caregiver Care coordination (not separately reported)  Note by: Aaron Done, MD Date: 12/27/2022; Time: 12:27 PM

## 2022-12-27 ENCOUNTER — Encounter: Payer: Self-pay | Admitting: Pain Medicine

## 2022-12-27 ENCOUNTER — Encounter: Payer: Self-pay | Admitting: Family Medicine

## 2022-12-27 ENCOUNTER — Ambulatory Visit: Payer: Medicare Other | Admitting: Family Medicine

## 2022-12-27 ENCOUNTER — Ambulatory Visit: Payer: Medicare Other | Admitting: Pain Medicine

## 2022-12-27 VITALS — BP 147/96 | HR 88 | Temp 97.2°F | Resp 16 | Ht 70.0 in | Wt 210.0 lb

## 2022-12-27 VITALS — BP 136/74 | HR 80 | Temp 98.5°F | Resp 18 | Ht 70.0 in

## 2022-12-27 DIAGNOSIS — S83242S Other tear of medial meniscus, current injury, left knee, sequela: Secondary | ICD-10-CM

## 2022-12-27 DIAGNOSIS — Z8249 Family history of ischemic heart disease and other diseases of the circulatory system: Secondary | ICD-10-CM | POA: Diagnosis not present

## 2022-12-27 DIAGNOSIS — I7143 Infrarenal abdominal aortic aneurysm, without rupture: Secondary | ICD-10-CM | POA: Diagnosis not present

## 2022-12-27 DIAGNOSIS — I509 Heart failure, unspecified: Secondary | ICD-10-CM

## 2022-12-27 DIAGNOSIS — I10 Essential (primary) hypertension: Secondary | ICD-10-CM

## 2022-12-27 DIAGNOSIS — M25561 Pain in right knee: Secondary | ICD-10-CM

## 2022-12-27 DIAGNOSIS — G894 Chronic pain syndrome: Secondary | ICD-10-CM | POA: Diagnosis not present

## 2022-12-27 DIAGNOSIS — K118 Other diseases of salivary glands: Secondary | ICD-10-CM | POA: Diagnosis not present

## 2022-12-27 DIAGNOSIS — R351 Nocturia: Secondary | ICD-10-CM

## 2022-12-27 DIAGNOSIS — G6289 Other specified polyneuropathies: Secondary | ICD-10-CM

## 2022-12-27 DIAGNOSIS — M7651 Patellar tendinitis, right knee: Secondary | ICD-10-CM

## 2022-12-27 DIAGNOSIS — R399 Unspecified symptoms and signs involving the genitourinary system: Secondary | ICD-10-CM

## 2022-12-27 DIAGNOSIS — I6782 Cerebral ischemia: Secondary | ICD-10-CM | POA: Diagnosis not present

## 2022-12-27 DIAGNOSIS — M170 Bilateral primary osteoarthritis of knee: Secondary | ICD-10-CM

## 2022-12-27 DIAGNOSIS — M25562 Pain in left knee: Secondary | ICD-10-CM

## 2022-12-27 DIAGNOSIS — M542 Cervicalgia: Secondary | ICD-10-CM | POA: Diagnosis not present

## 2022-12-27 DIAGNOSIS — M545 Low back pain, unspecified: Secondary | ICD-10-CM

## 2022-12-27 DIAGNOSIS — F333 Major depressive disorder, recurrent, severe with psychotic symptoms: Secondary | ICD-10-CM

## 2022-12-27 DIAGNOSIS — I700 Atherosclerosis of aorta: Secondary | ICD-10-CM | POA: Diagnosis not present

## 2022-12-27 DIAGNOSIS — C439 Malignant melanoma of skin, unspecified: Secondary | ICD-10-CM | POA: Diagnosis present

## 2022-12-27 DIAGNOSIS — M1711 Unilateral primary osteoarthritis, right knee: Secondary | ICD-10-CM

## 2022-12-27 DIAGNOSIS — K219 Gastro-esophageal reflux disease without esophagitis: Secondary | ICD-10-CM

## 2022-12-27 DIAGNOSIS — M4810 Ankylosing hyperostosis [Forestier], site unspecified: Secondary | ICD-10-CM | POA: Diagnosis not present

## 2022-12-27 DIAGNOSIS — Z79899 Other long term (current) drug therapy: Secondary | ICD-10-CM

## 2022-12-27 DIAGNOSIS — F411 Generalized anxiety disorder: Secondary | ICD-10-CM | POA: Diagnosis not present

## 2022-12-27 DIAGNOSIS — M456 Ankylosing spondylitis lumbar region: Secondary | ICD-10-CM | POA: Insufficient documentation

## 2022-12-27 DIAGNOSIS — Z79891 Long term (current) use of opiate analgesic: Secondary | ICD-10-CM

## 2022-12-27 DIAGNOSIS — F410 Panic disorder [episodic paroxysmal anxiety] without agoraphobia: Secondary | ICD-10-CM

## 2022-12-27 DIAGNOSIS — I2510 Atherosclerotic heart disease of native coronary artery without angina pectoris: Secondary | ICD-10-CM | POA: Diagnosis not present

## 2022-12-27 DIAGNOSIS — Z881 Allergy status to other antibiotic agents status: Secondary | ICD-10-CM | POA: Diagnosis not present

## 2022-12-27 DIAGNOSIS — M159 Polyosteoarthritis, unspecified: Secondary | ICD-10-CM | POA: Diagnosis not present

## 2022-12-27 DIAGNOSIS — J449 Chronic obstructive pulmonary disease, unspecified: Secondary | ICD-10-CM | POA: Diagnosis not present

## 2022-12-27 DIAGNOSIS — Z87891 Personal history of nicotine dependence: Secondary | ICD-10-CM | POA: Diagnosis not present

## 2022-12-27 DIAGNOSIS — R350 Frequency of micturition: Secondary | ICD-10-CM

## 2022-12-27 DIAGNOSIS — R296 Repeated falls: Secondary | ICD-10-CM | POA: Diagnosis not present

## 2022-12-27 DIAGNOSIS — H547 Unspecified visual loss: Secondary | ICD-10-CM | POA: Diagnosis not present

## 2022-12-27 DIAGNOSIS — M7650 Patellar tendinitis, unspecified knee: Secondary | ICD-10-CM

## 2022-12-27 DIAGNOSIS — R590 Localized enlarged lymph nodes: Secondary | ICD-10-CM | POA: Diagnosis not present

## 2022-12-27 DIAGNOSIS — M1712 Unilateral primary osteoarthritis, left knee: Secondary | ICD-10-CM

## 2022-12-27 DIAGNOSIS — G8929 Other chronic pain: Secondary | ICD-10-CM

## 2022-12-27 DIAGNOSIS — S83282S Other tear of lateral meniscus, current injury, left knee, sequela: Secondary | ICD-10-CM

## 2022-12-27 DIAGNOSIS — N401 Enlarged prostate with lower urinary tract symptoms: Secondary | ICD-10-CM

## 2022-12-27 DIAGNOSIS — M459 Ankylosing spondylitis of unspecified sites in spine: Secondary | ICD-10-CM | POA: Diagnosis not present

## 2022-12-27 DIAGNOSIS — S12100S Unspecified displaced fracture of second cervical vertebra, sequela: Secondary | ICD-10-CM

## 2022-12-27 DIAGNOSIS — C779 Secondary and unspecified malignant neoplasm of lymph node, unspecified: Secondary | ICD-10-CM | POA: Diagnosis not present

## 2022-12-27 MED ORDER — DULOXETINE HCL 30 MG PO CPEP: 90.0000 mg | ORAL_CAPSULE | Freq: Every day | ORAL | 1 refills | Status: DC

## 2022-12-27 MED ORDER — BUPRENORPHINE 10 MCG/HR TD PTWK
1.0000 | MEDICATED_PATCH | TRANSDERMAL | 0 refills | Status: DC
Start: 2023-02-21 — End: 2023-02-22

## 2022-12-27 MED ORDER — FINASTERIDE 5 MG PO TABS: 5.0000 mg | ORAL_TABLET | Freq: Every day | ORAL | 1 refills | Status: DC

## 2022-12-27 MED ORDER — PANTOPRAZOLE SODIUM 40 MG PO TBEC: 40.0000 mg | DELAYED_RELEASE_TABLET | Freq: Every day | ORAL | 1 refills | Status: DC

## 2022-12-27 MED ORDER — CARVEDILOL 12.5 MG PO TABS: 12.5000 mg | ORAL_TABLET | Freq: Two times a day (BID) | ORAL | 1 refills | Status: DC

## 2022-12-27 MED ORDER — TRAZODONE HCL 150 MG PO TABS
150.0000 mg | ORAL_TABLET | Freq: Every evening | ORAL | 3 refills | Status: DC | PRN
Start: 2022-12-27 — End: 2023-03-23

## 2022-12-27 MED ORDER — BUMETANIDE 0.5 MG PO TABS: 0.5000 mg | ORAL_TABLET | Freq: Two times a day (BID) | ORAL | 1 refills | Status: DC

## 2022-12-27 MED ORDER — OLANZAPINE 2.5 MG PO TABS: 2.5000 mg | ORAL_TABLET | Freq: Two times a day (BID) | ORAL | 1 refills | Status: DC

## 2022-12-27 MED ORDER — BUPRENORPHINE 10 MCG/HR TD PTWK
1.0000 | MEDICATED_PATCH | TRANSDERMAL | 0 refills | Status: DC
Start: 2023-01-24 — End: 2023-02-22

## 2022-12-27 MED ORDER — TAMSULOSIN HCL 0.4 MG PO CAPS: 0.4000 mg | ORAL_CAPSULE | Freq: Every day | ORAL | 1 refills | Status: DC

## 2022-12-27 MED ORDER — GABAPENTIN 300 MG PO CAPS: 300.0000 mg | ORAL_CAPSULE | Freq: Two times a day (BID) | ORAL | 1 refills | Status: DC

## 2022-12-27 MED ORDER — BUPRENORPHINE 10 MCG/HR TD PTWK
1.0000 | MEDICATED_PATCH | TRANSDERMAL | 0 refills | Status: DC
Start: 2022-12-27 — End: 2023-03-23

## 2022-12-27 NOTE — Progress Notes (Signed)
Subjective:    Patient ID: Aaron Gibbs, male    DOB: 08/31/1934, 88 y.o.   MRN: [id number]  Aaron Gibbs is a 88 y.o. male presenting on 12/27/2022 for Neck Injury (The pt following up from Rehab after a fall where he fracture his cervical spine x 1 mth ago) and Insomnia (The pt requesting to go back on the Trazodone to help with sleeping. )   HPI  Presents here today after prior hospitalization and SNF He is at assisted living facility Here with home aide caregiver  Insomnia Still taking Melatonin 5mg  x 3 = 15mg  daily Request to restart Trazodone 150mg   Chronic Pain Syndrome Chronic Low Back Pain, Osteoarthritis multiple joints Chronic Knee Pain Followed by [facility] Management On buprenorphine He stopped Meloxicam 15mg , not helpful.  Admits episodic Visual Hallucinations   Generalized Anxiety Disorder, Panic Attack Major Depression recurrent severe Chronic history of mental health problems as we have followed and discussed over period of few years.  ED visit 1/1 and 1/2 for anxiety and mood. He has daily panic attack anxiety worsening. No longer on Lorazepam He is on Abilify to help manage his anxiety   Weakness of Lower Extremities Unsteady Gait / Balance problem He is mostly wheelchair bound and can transfer with assistance. He is in need of further therapy to improve his strength and mobility. He is unable to leave home safely       12/27/2022    2:08 PM 12/27/2022    9:59 AM 09/28/2022    1:58 PM  Depression screen PHQ 2/9  Decreased Interest 1 0 0  Down, Depressed, Hopeless 2 1 0  PHQ - 2 Score 3 1 0  Altered sleeping 1    Tired, decreased energy 1    Change in appetite 1    Feeling bad or failure about yourself  1    Trouble concentrating 1    Moving slowly or fidgety/restless 0    Suicidal thoughts 0    PHQ-9 Score 8    Difficult doing work/chores Somewhat difficult      Social History   Tobacco Use   Smoking status: Former    Years: 20     Types: Cigarettes   Smokeless tobacco: Former    Types: Chew   Tobacco comments:    stopped 15 years ago  Vaping Use   Vaping Use: Never used  Substance Use Topics   Alcohol use: Not Currently    Comment: past   Drug use: Never    Review of Systems Per HPI unless specifically indicated above     Objective:    BP 136/74 (BP Location: Left Arm, Patient Position: Sitting, Cuff Size: Large)   Pulse 80   Temp 98.5 F (36.9 C) (Oral)   Resp 18   Ht 5\' 10"  (1.778 m)   SpO2 96%   BMI 30.13 kg/m   Wt Readings from Last 3 Encounters:  12/27/22 210 lb (95.3 kg)  11/18/22 210 lb (95.3 kg)  10/30/22 213 lb 3 oz (96.7 kg)    Physical Exam Vitals and nursing note reviewed.  Constitutional:      General: He is not in acute distress.    Appearance: Normal appearance. He is well-developed. He is obese. He is not diaphoretic.     Comments: Elderly chronically ill appearing, currently comfortable, cooperative  HENT:     Head: Normocephalic and atraumatic.  Eyes:     General:        Right eye:  No discharge.        Left eye: No discharge.     Conjunctiva/sclera: Conjunctivae normal.     Comments: Poor vision  Cardiovascular:     Rate and Rhythm: Normal rate.  Pulmonary:     Effort: Pulmonary effort is normal.  Skin:    General: Skin is warm and dry.     Findings: Lesion (Large raised erythematous fleshy growth left forearm inner, possible varicose vein cluster w/ keratoacanthoma or growth) present. No erythema or rash.  Neurological:     Mental Status: He is alert and oriented to person, place, and time.  Psychiatric:        Mood and Affect: Mood normal.        Behavior: Behavior normal.        Thought Content: Thought content normal.     Comments: Well groomed, good eye contact, normal speech and thoughts       Results for orders placed or performed during the hospital encounter of 10/17/22  Blood Culture (routine x 2)   Specimen: BLOOD RIGHT ARM  Result Value Ref  Range   Specimen Description BLOOD RIGHT ARM    Special Requests      BOTTLES DRAWN AEROBIC AND ANAEROBIC Blood Culture adequate volume   Culture  Setup Time      GRAM POSITIVE COCCI IN CLUSTERS IN BOTH AEROBIC AND ANAEROBIC BOTTLES CRITICAL RESULT CALLED TO, READ BACK BY AND VERIFIED WITH: PHARMD T. DANG [id number] @2046  FH    Culture (A)     STAPHYLOCOCCUS HOMINIS THE SIGNIFICANCE OF ISOLATING THIS ORGANISM FROM A SINGLE SET OF BLOOD CULTURES WHEN MULTIPLE SETS ARE DRAWN IS UNCERTAIN. PLEASE NOTIFY THE MICROBIOLOGY DEPARTMENT WITHIN ONE WEEK IF SPECIATION AND SENSITIVITIES ARE REQUIRED. Performed at [facility], [address] [place], [place] [address]    Report Status 10/19/2022 FINAL   Blood Culture (routine x 2)   Specimen: BLOOD LEFT ARM  Result Value Ref Range   Specimen Description BLOOD LEFT ARM    Special Requests      BOTTLES DRAWN AEROBIC AND ANAEROBIC Blood Culture adequate volume   Culture  Setup Time      GRAM POSITIVE COCCI ANAEROBIC BOTTLE ONLY CRITICAL VALUE NOTED.  VALUE IS CONSISTENT WITH PREVIOUSLY REPORTED AND CALLED VALUE.    Culture (A)     STAPHYLOCOCCUS EPIDERMIDIS THE SIGNIFICANCE OF ISOLATING THIS ORGANISM FROM A SINGLE SET OF BLOOD CULTURES WHEN MULTIPLE SETS ARE DRAWN IS UNCERTAIN. PLEASE NOTIFY THE MICROBIOLOGY DEPARTMENT WITHIN ONE WEEK IF SPECIATION AND SENSITIVITIES ARE REQUIRED. Performed at [facility], [address], [place], [place] [address]    Report Status 10/19/2022 FINAL   Resp panel by RT-PCR (RSV, Flu A&B, Covid) Anterior Nasal Swab   Specimen: Anterior Nasal Swab  Result Value Ref Range   SARS Coronavirus 2 by RT PCR POSITIVE (A) NEGATIVE   Influenza A by PCR NEGATIVE NEGATIVE   Influenza B by PCR NEGATIVE NEGATIVE   Resp Syncytial Virus by PCR NEGATIVE NEGATIVE  Blood Culture ID Panel (Reflexed)  Result Value Ref Range   Enterococcus faecalis NOT DETECTED NOT DETECTED   Enterococcus Faecium NOT DETECTED NOT DETECTED    Listeria monocytogenes NOT DETECTED NOT DETECTED   Staphylococcus species DETECTED (A) NOT DETECTED   Staphylococcus aureus (BCID) NOT DETECTED NOT DETECTED   Staphylococcus epidermidis NOT DETECTED NOT DETECTED   Staphylococcus lugdunensis NOT DETECTED NOT DETECTED   Streptococcus species NOT DETECTED NOT DETECTED   Streptococcus agalactiae  NOT DETECTED NOT DETECTED   Streptococcus pneumoniae NOT DETECTED NOT DETECTED   Streptococcus pyogenes NOT DETECTED NOT DETECTED   A.calcoaceticus-baumannii NOT DETECTED NOT DETECTED   Bacteroides fragilis NOT DETECTED NOT DETECTED   Enterobacterales NOT DETECTED NOT DETECTED   Enterobacter cloacae complex NOT DETECTED NOT DETECTED   Escherichia coli NOT DETECTED NOT DETECTED   Klebsiella aerogenes NOT DETECTED NOT DETECTED   Klebsiella oxytoca NOT DETECTED NOT DETECTED   Klebsiella pneumoniae NOT DETECTED NOT DETECTED   Proteus species NOT DETECTED NOT DETECTED   Salmonella species NOT DETECTED NOT DETECTED   Serratia marcescens NOT DETECTED NOT DETECTED   Haemophilus influenzae NOT DETECTED NOT DETECTED   Neisseria meningitidis NOT DETECTED NOT DETECTED   Pseudomonas aeruginosa NOT DETECTED NOT DETECTED   Stenotrophomonas maltophilia NOT DETECTED NOT DETECTED   Candida albicans NOT DETECTED NOT DETECTED   Candida auris NOT DETECTED NOT DETECTED   Candida glabrata NOT DETECTED NOT DETECTED   Candida krusei NOT DETECTED NOT DETECTED   Candida parapsilosis NOT DETECTED NOT DETECTED   Candida tropicalis NOT DETECTED NOT DETECTED   Cryptococcus neoformans/gattii NOT DETECTED NOT DETECTED  CBC with Differential  Result Value Ref Range   WBC 11.6 (H) 4.0 - 10.5 K/uL   RBC 3.86 (L) 4.22 - 5.81 MIL/uL   Hemoglobin 11.4 (L) 13.0 - 17.0 g/dL   HCT 16.1 (L) 09.6 - 04.5 %   MCV 94.3 80.0 - 100.0 fL   MCH 29.5 26.0 - 34.0 pg   MCHC 31.3 30.0 - 36.0 g/dL   RDW 40.9 (H) 81.1 - 91.4 %   Platelets 201 150 - 400 K/uL   nRBC 0.0 0.0 - 0.2 %    Neutrophils Relative % 53 %   Neutro Abs 6.2 1.7 - 7.7 K/uL   Lymphocytes Relative 16 %   Lymphs Abs 1.9 0.7 - 4.0 K/uL   Monocytes Relative 28 %   Monocytes Absolute 3.2 (H) 0.1 - 1.0 K/uL   Eosinophils Relative 1 %   Eosinophils Absolute 0.1 0.0 - 0.5 K/uL   Basophils Relative 0 %   Basophils Absolute 0.1 0.0 - 0.1 K/uL   Immature Granulocytes 2 %   Abs Immature Granulocytes 0.22 (H) 0.00 - 0.07 K/uL  Comprehensive metabolic panel  Result Value Ref Range   Sodium 143 135 - 145 mmol/L   Potassium 3.1 (L) 3.5 - 5.1 mmol/L   Chloride 108 98 - 111 mmol/L   CO2 22 22 - 32 mmol/L   Glucose, Bld 138 (H) 70 - 99 mg/dL   BUN 45 (H) 8 - 23 mg/dL   Creatinine, Ser 7.82 (H) 0.61 - 1.24 mg/dL   Calcium 9.3 8.9 - 95.6 mg/dL   Total Protein 6.9 6.5 - 8.1 g/dL   Albumin 3.7 3.5 - 5.0 g/dL   AST 39 15 - 41 U/L   ALT 40 0 - 44 U/L   Alkaline Phosphatase 54 38 - 126 U/L   Total Bilirubin 1.4 (H) 0.3 - 1.2 mg/dL   GFR, Estimated 34 (L) >60 mL/min   Anion gap 13 5 - 15  Lactic acid, plasma  Result Value Ref Range   Lactic Acid, Venous 4.6 (HH) 0.5 - 1.9 mmol/L  Lactic acid, plasma  Result Value Ref Range   Lactic Acid, Venous 2.7 (HH) 0.5 - 1.9 mmol/L  Protime-INR  Result Value Ref Range   Prothrombin Time 14.5 11.4 - 15.2 seconds   INR 1.1 0.8 - 1.2  APTT  Result Value Ref  Range   aPTT 23 (L) 24 - 36 seconds  Urinalysis, w/ Reflex to Culture (Infection Suspected) -Urine, Catheterized  Result Value Ref Range   Specimen Source URINE, CATHETERIZED    Color, Urine AMBER (A) YELLOW   APPearance HAZY (A) CLEAR   Specific Gravity, Urine 1.026 1.005 - 1.030   pH 5.0 5.0 - 8.0   Glucose, UA NEGATIVE NEGATIVE mg/dL   Hgb urine dipstick NEGATIVE NEGATIVE   Bilirubin Urine NEGATIVE NEGATIVE   Ketones, ur NEGATIVE NEGATIVE mg/dL   Protein, ur 914 (A) NEGATIVE mg/dL   Nitrite NEGATIVE NEGATIVE   Leukocytes,Ua NEGATIVE NEGATIVE   RBC / HPF 0-5 0 - 5 RBC/hpf   WBC, UA 0-5 0 - 5 WBC/hpf    Bacteria, UA RARE (A) NONE SEEN   Squamous Epithelial / HPF 0-5 0 - 5 /HPF   Mucus PRESENT   Procalcitonin  Result Value Ref Range   Procalcitonin <0.10 ng/mL  Iron and TIBC  Result Value Ref Range   Iron 36 (L) 45 - 182 ug/dL   TIBC 782 956 - 213 ug/dL   Saturation Ratios 13 (L) 17.9 - 39.5 %   UIBC 244 ug/dL  Ferritin  Result Value Ref Range   Ferritin 38 24 - 336 ng/mL  Comprehensive metabolic panel  Result Value Ref Range   Sodium 140 135 - 145 mmol/L   Potassium 3.1 (L) 3.5 - 5.1 mmol/L   Chloride 111 98 - 111 mmol/L   CO2 22 22 - 32 mmol/L   Glucose, Bld 103 (H) 70 - 99 mg/dL   BUN 25 (H) 8 - 23 mg/dL   Creatinine, Ser 0.86 0.61 - 1.24 mg/dL   Calcium 8.1 (L) 8.9 - 10.3 mg/dL   Total Protein 5.1 (L) 6.5 - 8.1 g/dL   Albumin 2.6 (L) 3.5 - 5.0 g/dL   AST 24 15 - 41 U/L   ALT 26 0 - 44 U/L   Alkaline Phosphatase 41 38 - 126 U/L   Total Bilirubin 1.3 (H) 0.3 - 1.2 mg/dL   GFR, Estimated >57 >84 mL/min   Anion gap 7 5 - 15  Magnesium  Result Value Ref Range   Magnesium 1.9 1.7 - 2.4 mg/dL  CBC with Differential/Platelet  Result Value Ref Range   WBC 4.3 4.0 - 10.5 K/uL   RBC 2.86 (L) 4.22 - 5.81 MIL/uL   Hemoglobin 8.6 (L) 13.0 - 17.0 g/dL   HCT 69.6 (L) 29.5 - 28.4 %   MCV 92.3 80.0 - 100.0 fL   MCH 30.1 26.0 - 34.0 pg   MCHC 32.6 30.0 - 36.0 g/dL   RDW 13.2 (H) 44.0 - 10.2 %   Platelets 126 (L) 150 - 400 K/uL   nRBC 0.0 0.0 - 0.2 %   Neutrophils Relative % 45 %   Neutro Abs 1.9 1.7 - 7.7 K/uL   Lymphocytes Relative 21 %   Lymphs Abs 0.9 0.7 - 4.0 K/uL   Monocytes Relative 30 %   Monocytes Absolute 1.3 (H) 0.1 - 1.0 K/uL   Eosinophils Relative 1 %   Eosinophils Absolute 0.1 0.0 - 0.5 K/uL   Basophils Relative 0 %   Basophils Absolute 0.0 0.0 - 0.1 K/uL   Immature Granulocytes 3 %   Abs Immature Granulocytes 0.11 (H) 0.00 - 0.07 K/uL  Basic metabolic panel  Result Value Ref Range   Sodium 140 135 - 145 mmol/L   Potassium 3.5 3.5 - 5.1 mmol/L   Chloride  111 98 -  111 mmol/L   CO2 25 22 - 32 mmol/L   Glucose, Bld 104 (H) 70 - 99 mg/dL   BUN 22 8 - 23 mg/dL   Creatinine, Ser 8.46 0.61 - 1.24 mg/dL   Calcium 8.4 (L) 8.9 - 10.3 mg/dL   GFR, Estimated >96 >29 mL/min   Anion gap 4 (L) 5 - 15  CBC  Result Value Ref Range   WBC 3.7 (L) 4.0 - 10.5 K/uL   RBC 2.97 (L) 4.22 - 5.81 MIL/uL   Hemoglobin 9.0 (L) 13.0 - 17.0 g/dL   HCT 52.8 (L) 41.3 - 24.4 %   MCV 94.6 80.0 - 100.0 fL   MCH 30.3 26.0 - 34.0 pg   MCHC 32.0 30.0 - 36.0 g/dL   RDW 01.0 (H) 27.2 - 53.6 %   Platelets 134 (L) 150 - 400 K/uL   nRBC 0.0 0.0 - 0.2 %  Magnesium  Result Value Ref Range   Magnesium 2.1 1.7 - 2.4 mg/dL  Basic metabolic panel  Result Value Ref Range   Sodium 141 135 - 145 mmol/L   Potassium 3.7 3.5 - 5.1 mmol/L   Chloride 109 98 - 111 mmol/L   CO2 22 22 - 32 mmol/L   Glucose, Bld 119 (H) 70 - 99 mg/dL   BUN 21 8 - 23 mg/dL   Creatinine, Ser 6.44 0.61 - 1.24 mg/dL   Calcium 8.7 (L) 8.9 - 10.3 mg/dL   GFR, Estimated >03 >47 mL/min   Anion gap 10 5 - 15  CBC  Result Value Ref Range   WBC 3.5 (L) 4.0 - 10.5 K/uL   RBC 3.11 (L) 4.22 - 5.81 MIL/uL   Hemoglobin 9.3 (L) 13.0 - 17.0 g/dL   HCT 42.5 (L) 95.6 - 38.7 %   MCV 94.2 80.0 - 100.0 fL   MCH 29.9 26.0 - 34.0 pg   MCHC 31.7 30.0 - 36.0 g/dL   RDW 56.4 (H) 33.2 - 95.1 %   Platelets 142 (L) 150 - 400 K/uL   nRBC 0.0 0.0 - 0.2 %  Magnesium  Result Value Ref Range   Magnesium 2.0 1.7 - 2.4 mg/dL  Basic metabolic panel  Result Value Ref Range   Sodium 142 135 - 145 mmol/L   Potassium 3.8 3.5 - 5.1 mmol/L   Chloride 115 (H) 98 - 111 mmol/L   CO2 23 22 - 32 mmol/L   Glucose, Bld 114 (H) 70 - 99 mg/dL   BUN 15 8 - 23 mg/dL   Creatinine, Ser 8.84 0.61 - 1.24 mg/dL   Calcium 8.6 (L) 8.9 - 10.3 mg/dL   GFR, Estimated >16 >60 mL/min   Anion gap 4 (L) 5 - 15  Basic metabolic panel  Result Value Ref Range   Sodium 141 135 - 145 mmol/L   Potassium 3.8 3.5 - 5.1 mmol/L   Chloride 112 (H) 98 - 111  mmol/L   CO2 23 22 - 32 mmol/L   Glucose, Bld 119 (H) 70 - 99 mg/dL   BUN 13 8 - 23 mg/dL   Creatinine, Ser 6.30 0.61 - 1.24 mg/dL   Calcium 8.7 (L) 8.9 - 10.3 mg/dL   GFR, Estimated >16 >01 mL/min   Anion gap 6 5 - 15  CBC  Result Value Ref Range   WBC 3.9 (L) 4.0 - 10.5 K/uL   RBC 3.35 (L) 4.22 - 5.81 MIL/uL   Hemoglobin 10.1 (L) 13.0 - 17.0 g/dL   HCT 09.3 (L) 23.5 - 57.3 %  MCV 94.0 80.0 - 100.0 fL   MCH 30.1 26.0 - 34.0 pg   MCHC 32.1 30.0 - 36.0 g/dL   RDW 16.1 (H) 09.6 - 04.5 %   Platelets 150 150 - 400 K/uL   nRBC 0.5 (H) 0.0 - 0.2 %  Magnesium  Result Value Ref Range   Magnesium 2.2 1.7 - 2.4 mg/dL  Glucose, capillary  Result Value Ref Range   Glucose-Capillary 143 (H) 70 - 99 mg/dL  POC occult blood, ED  Result Value Ref Range   Fecal Occult Bld POSITIVE (A) NEGATIVE  CBG monitoring, ED  Result Value Ref Range   Glucose-Capillary 113 (H) 70 - 99 mg/dL  I-Stat venous blood gas, ([facility] ED, [facility], [facility])  Result Value Ref Range   pH, Ven 7.361 7.25 - 7.43   pCO2, Ven 38.0 (L) 44 - 60 mmHg   pO2, Ven 25 (LL) 32 - 45 mmHg   Bicarbonate 21.5 20.0 - 28.0 mmol/L   TCO2 23 22 - 32 mmol/L   O2 Saturation 44 %   Acid-base deficit 4.0 (H) 0.0 - 2.0 mmol/L   Sodium 144 135 - 145 mmol/L   Potassium 3.4 (L) 3.5 - 5.1 mmol/L   Calcium, Ion 1.22 1.15 - 1.40 mmol/L   HCT 30.0 (L) 39.0 - 52.0 %   Hemoglobin 10.2 (L) 13.0 - 17.0 g/dL   Sample type VENOUS    Comment NOTIFIED PHYSICIAN   Type and screen [facility]  Result Value Ref Range   ABO/RH(D) O POS    Antibody Screen NEG    Sample Expiration      10/20/2022,2359 Performed at [facility], [address], [place], [place] [address]       Assessment & Plan:   Problem List Items Addressed This Visit     Depression - Primary   Relevant Medications   traZODone (DESYREL) 150 MG tablet   DULoxetine (CYMBALTA) 30 MG capsule   OLANZapine (ZYPREXA) 2.5 MG tablet   Essential hypertension    Relevant Medications   bumetanide (BUMEX) 0.5 MG tablet   carvedilol (COREG) 12.5 MG tablet   Generalized anxiety disorder with panic attacks   Relevant Medications   traZODone (DESYREL) 150 MG tablet   DULoxetine (CYMBALTA) 30 MG capsule   GERD (gastroesophageal reflux disease)   Relevant Medications   pantoprazole (PROTONIX) 40 MG tablet   Other Visit Diagnoses     Congestive heart failure, unspecified HF chronicity, unspecified heart failure type (HCC)       Relevant Medications   bumetanide (BUMEX) 0.5 MG tablet   carvedilol (COREG) 12.5 MG tablet   Benign prostatic hyperplasia with urinary frequency       Relevant Medications   finasteride (PROSCAR) 5 MG tablet   Other polyneuropathy       Relevant Medications   traZODone (DESYREL) 150 MG tablet   DULoxetine (CYMBALTA) 30 MG capsule   gabapentin (NEURONTIN) 300 MG capsule   OLANZapine (ZYPREXA) 2.5 MG tablet   Nocturia       Relevant Medications   tamsulosin (FLOMAX) 0.4 MG CAPS capsule   Lower urinary tract symptoms (LUTS)       Relevant Medications   tamsulosin (FLOMAX) 0.4 MG CAPS capsule       Post SNF stay evaluation Currently with caregiver / home aide  Reviewed and re ordered ALL medications listed. Current med rec is correct  Cannot order Oxycodone today, please discuss with Dr Laban Emperor and pain management. If they  need [place] to order any medicine or permission, please have them contact us.  Keep up with Dermatology tomorrow for evaluation of lesion on forearm. Suspected varicose veins vs keratoacanthoma  Restart Trazodone 150mg  nightly for sleep Okay to take Melatonin 5mg  x 3 = 15mg  daily   Meds ordered this encounter  Medications   traZODone (DESYREL) 150 MG tablet    Sig: Take 1 tablet (150 mg total) by mouth at bedtime as needed.    Dispense:  90 tablet    Refill:  3   bumetanide (BUMEX) 0.5 MG tablet    Sig: Take 1 tablet (0.5 mg total) by mouth 2 (two) times daily.    Dispense:  180 tablet     Refill:  1   carvedilol (COREG) 12.5 MG tablet    Sig: Take 1 tablet (12.5 mg total) by mouth 2 (two) times daily.    Dispense:  180 tablet    Refill:  1   DULoxetine (CYMBALTA) 30 MG capsule    Sig: Take 3 capsules (90 mg total) by mouth daily.    Dispense:  270 capsule    Refill:  1   finasteride (PROSCAR) 5 MG tablet    Sig: Take 1 tablet (5 mg total) by mouth daily.    Dispense:  90 tablet    Refill:  1   gabapentin (NEURONTIN) 300 MG capsule    Sig: Take 1 capsule (300 mg total) by mouth 2 (two) times daily.    Dispense:  180 capsule    Refill:  1   OLANZapine (ZYPREXA) 2.5 MG tablet    Sig: Take 1 tablet (2.5 mg total) by mouth 2 (two) times daily.    Dispense:  180 tablet    Refill:  1   pantoprazole (PROTONIX) 40 MG tablet    Sig: Take 1 tablet (40 mg total) by mouth daily before breakfast.    Dispense:  90 tablet    Refill:  1   tamsulosin (FLOMAX) 0.4 MG CAPS capsule    Sig: Take 1 capsule (0.4 mg total) by mouth daily.    Dispense:  90 capsule    Refill:  1      Follow up plan: Return in about 3 months (around 03/29/2023) for 3 month follow-up updates.   Saralyn Pilar, DO [facility] 12/27/2022, 2:16 PM

## 2022-12-27 NOTE — Patient Instructions (Addendum)
Thank you for coming to the office today.  Cannot order Oxycodone today, please discuss with Dr Laban Emperor and pain management. If they need [place] to order any medicine or permission, please have them contact us.  Keep up with Dermatology tomorrow for evaluation of lesion on forearm.  Re ordered all medications including Trazodone 150mg  nightly for sleep  Okay to take Melatonin 5mg  x 3 = 15mg  daily  Please schedule a Follow-up Appointment to: Return in about 3 months (around 03/29/2023) for 3 month follow-up updates.  If you have any other questions or concerns, please feel free to call the office or send a message through MyChart. You may also schedule an earlier appointment if necessary.  Additionally, you may be receiving a survey about your experience at our office within a few days to 1 week by e-mail or mail. We value your feedback.  Saralyn Pilar, DO [facility], [place]

## 2022-12-27 NOTE — Progress Notes (Signed)
Safety precautions to be maintained throughout the outpatient stay will include: orient to surroundings, keep bed in low position, maintain call bell within reach at all times, provide assistance with transfer out of bed and ambulation.   Did not bring medications with him today. Patient didn't think to bring them. Out of medication.

## 2022-12-28 ENCOUNTER — Ambulatory Visit
Admission: RE | Admit: 2022-12-28 | Discharge: 2022-12-28 | Disposition: A | Discharge status: Home / Self Care | Payer: Medicare Other | Source: Ambulatory Visit | Attending: General Surgery | Admitting: General Surgery

## 2022-12-28 DIAGNOSIS — M459 Ankylosing spondylitis of unspecified sites in spine: Secondary | ICD-10-CM | POA: Insufficient documentation

## 2022-12-28 DIAGNOSIS — I7143 Infrarenal abdominal aortic aneurysm, without rupture: Secondary | ICD-10-CM | POA: Insufficient documentation

## 2022-12-28 DIAGNOSIS — G894 Chronic pain syndrome: Secondary | ICD-10-CM | POA: Insufficient documentation

## 2022-12-28 DIAGNOSIS — Z881 Allergy status to other antibiotic agents status: Secondary | ICD-10-CM | POA: Insufficient documentation

## 2022-12-28 DIAGNOSIS — C779 Secondary and unspecified malignant neoplasm of lymph node, unspecified: Secondary | ICD-10-CM | POA: Insufficient documentation

## 2022-12-28 DIAGNOSIS — R296 Repeated falls: Secondary | ICD-10-CM | POA: Insufficient documentation

## 2022-12-28 DIAGNOSIS — M4810 Ankylosing hyperostosis [Forestier], site unspecified: Secondary | ICD-10-CM | POA: Insufficient documentation

## 2022-12-28 DIAGNOSIS — M159 Polyosteoarthritis, unspecified: Secondary | ICD-10-CM | POA: Insufficient documentation

## 2022-12-28 DIAGNOSIS — R590 Localized enlarged lymph nodes: Secondary | ICD-10-CM | POA: Insufficient documentation

## 2022-12-28 DIAGNOSIS — I6782 Cerebral ischemia: Secondary | ICD-10-CM | POA: Insufficient documentation

## 2022-12-28 DIAGNOSIS — C439 Malignant melanoma of skin, unspecified: Secondary | ICD-10-CM | POA: Insufficient documentation

## 2022-12-28 DIAGNOSIS — H547 Unspecified visual loss: Secondary | ICD-10-CM | POA: Insufficient documentation

## 2022-12-28 DIAGNOSIS — K118 Other diseases of salivary glands: Secondary | ICD-10-CM | POA: Insufficient documentation

## 2022-12-28 DIAGNOSIS — I2510 Atherosclerotic heart disease of native coronary artery without angina pectoris: Secondary | ICD-10-CM | POA: Insufficient documentation

## 2022-12-28 DIAGNOSIS — Z87891 Personal history of nicotine dependence: Secondary | ICD-10-CM | POA: Insufficient documentation

## 2022-12-28 DIAGNOSIS — Z8249 Family history of ischemic heart disease and other diseases of the circulatory system: Secondary | ICD-10-CM | POA: Insufficient documentation

## 2022-12-28 DIAGNOSIS — I700 Atherosclerosis of aorta: Secondary | ICD-10-CM | POA: Insufficient documentation

## 2022-12-28 DIAGNOSIS — M542 Cervicalgia: Secondary | ICD-10-CM | POA: Insufficient documentation

## 2022-12-28 DIAGNOSIS — J449 Chronic obstructive pulmonary disease, unspecified: Secondary | ICD-10-CM | POA: Insufficient documentation

## 2022-12-28 LAB — GLUCOSE, CAPILLARY: Glucose-Capillary: 109 mg/dL — ABNORMAL HIGH (ref 70–99)

## 2022-12-28 MED ORDER — FLUDEOXYGLUCOSE F - 18 (FDG) INJECTION
10.9000 | Freq: Once | INTRAVENOUS | Status: AC
  Administered 2022-12-28: 11.48 via INTRAVENOUS

## 2023-01-01 ENCOUNTER — Encounter: Payer: Self-pay | Admitting: Internal Medicine

## 2023-01-02 ENCOUNTER — Telehealth: Payer: Self-pay

## 2023-01-02 ENCOUNTER — Encounter: Payer: Self-pay | Admitting: Family Medicine

## 2023-01-02 ENCOUNTER — Other Ambulatory Visit: Payer: Self-pay | Admitting: General Surgery

## 2023-01-02 DIAGNOSIS — C4362 Malignant melanoma of left upper limb, including shoulder: Secondary | ICD-10-CM

## 2023-01-02 NOTE — Telephone Encounter (Signed)
Copied from CRM [id number]. Topic: General - Other >> Jan 02, 2023  2:34 PM Clide Dales wrote: Victorino Dike with [facility] called to report that she did a home visit with patient this morning and he said his pain was a 7 out of 10. Please reach out to [place] [facility] with any questions.[phone]

## 2023-01-02 NOTE — Telephone Encounter (Signed)
Okay thank you. He is followed by Pain Management.  Saralyn Pilar, DO [facility] 01/02/2023, 5:22 PM

## 2023-01-03 ENCOUNTER — Encounter: Payer: Self-pay | Admitting: Internal Medicine

## 2023-01-05 NOTE — Patient Instructions (Signed)
SURGICAL WAITING ROOM VISITATION  Patients having surgery or a procedure may have no more than 2 support people in the waiting area - these visitors may rotate.    Children under the age of 27 must have an adult with them who is not the patient.  If the patient needs to stay at the hospital during part of their recovery, the visitor guidelines for inpatient rooms apply. Pre-op nurse will coordinate an appropriate time for 1 support person to accompany patient in pre-op.  This support person may not rotate.    Please refer to the [facility] website for the visitor guidelines for Inpatients (after your surgery is over and you are in a regular room).       Your procedure is scheduled on: 01-11-23   Report to [facility] Main Entrance    Report to admitting at     0745  AM   Call this number if you have problems the morning of surgery [phone]   Do not eat food :After Midnight.   After Midnight you may have the following liquids until _0700_____ AM/ DAY OF SURGERY  Water Non-Citrus Juices (without pulp, NO RED-Apple, White grape, White cranberry) Black Coffee (NO MILK/CREAM OR CREAMERS, sugar ok)  Clear Tea (NO MILK/CREAM OR CREAMERS, sugar ok) regular and decaf                             Plain Jell-O (NO RED)                                           Fruit ices (not with fruit pulp, NO RED)                                     Popsicles (NO RED)                                                               Sports drinks like Gatorade (NO RED)                           If you have questions, please contact your surgeon's office.   FOLLOW ANY ADDITIONAL PRE OP INSTRUCTIONS YOU RECEIVED FROM YOUR SURGEON'S OFFICE!!!     Oral Hygiene is also important to reduce your risk of infection.                                    Remember - BRUSH YOUR TEETH THE MORNING OF SURGERY WITH YOUR REGULAR TOOTHPASTE  DENTURES WILL BE REMOVED PRIOR TO SURGERY PLEASE DO NOT APPLY "Poly  grip" OR ADHESIVES!!!   Do NOT smoke after Midnight   Take these medicines the morning of surgery with A SIP OF WATER: Tamsulosin, Pantoprazole, olanzapine (zyprexa), gabapentin, Tylenol, finasteride, duloxetine, eye drops as usual, carvedilol, bring rescue inhaler with you.  You may not have any metal on your body including hair pins, jewelry, and body piercing             Do not wear  lotions, powders, perfumes/cologne, or deodorant                  Men may shave face and neck.   Do not bring valuables to the hospital. [facility] IS NOT             RESPONSIBLE   FOR VALUABLES.   Contacts, glasses, dentures or bridgework may not be worn into surgery.     DO NOT BRING YOUR HOME MEDICATIONS TO THE HOSPITAL. PHARMACY WILL DISPENSE MEDICATIONS LISTED ON YOUR MEDICATION LIST TO YOU DURING YOUR ADMISSION IN THE HOSPITAL!    Patients discharged on the day of surgery will not be allowed to drive home.  Someone NEEDS to stay with you for the first 24 hours after anesthesia.   Special Instructions: Bring a copy of your healthcare power of attorney and living will documents the day of surgery if you haven't scanned them before.              Please read over the following fact sheets you were given: IF YOU HAVE QUESTIONS ABOUT YOUR PRE-OP INSTRUCTIONS PLEASE CALL [phone]    If you test positive for Covid or have been in contact with anyone that has tested positive in the last 10 days please notify you surgeon.    [facility] - Preparing for Surgery Before surgery, you can play an important role.  Because skin is not sterile, your skin needs to be as free of germs as possible.  You can reduce the number of germs on your skin by washing with CHG (chlorahexidine gluconate) soap before surgery.  CHG is an antiseptic cleaner which kills germs and bonds with the skin to continue killing germs even after washing. Please DO NOT use if you have an allergy to CHG  or antibacterial soaps.  If your skin becomes reddened/irritated stop using the CHG and inform your nurse when you arrive at Short Stay. Do not shave (including legs and underarms) for at least 48 hours prior to the first CHG shower.  You may shave your face/neck. Please follow these instructions carefully:  1.  Shower with CHG Soap the night before surgery and the  morning of Surgery.  2.  If you choose to wash your hair, wash your hair first as usual with your  normal  shampoo.  3.  After you shampoo, rinse your hair and body thoroughly to remove the  shampoo.                           4.  Use CHG as you would any other liquid soap.  You can apply chg directly  to the skin and wash                       Gently with a scrungie or clean washcloth.  5.  Apply the CHG Soap to your body ONLY FROM THE NECK DOWN.   Do not use on face/ open                           Wound or open sores. Avoid contact with eyes, ears mouth and genitals (private parts).  Wash face,  Genitals (private parts) with your normal soap.             6.  Wash thoroughly, paying special attention to the area where your surgery  will be performed.  7.  Thoroughly rinse your body with warm water from the neck down.  8.  DO NOT shower/wash with your normal soap after using and rinsing off  the CHG Soap.                9.  Pat yourself dry with a clean towel.            10.  Wear clean pajamas.            11.  Place clean sheets on your bed the night of your first shower and do not  sleep with pets. Day of Surgery : Do not apply any lotions/deodorants the morning of surgery.  Please wear clean clothes to the hospital/surgery center.  FAILURE TO FOLLOW THESE INSTRUCTIONS MAY RESULT IN THE CANCELLATION OF YOUR SURGERY PATIENT SIGNATURE_________________________________  NURSE SIGNATURE__________________________________  ________________________________________________________________________

## 2023-01-05 NOTE — Progress Notes (Addendum)
PCP - Smitty Cords, DO LOV 12-27-22 Clearance under letters tab in epic Cardiologist - no  PPM/ICD -  Device Orders -  Rep Notified -   Chest x-ray - 11-18-22 EKG - 10-17-22 Stress Test -  ECHO - 2020 Cardiac Cath -   Sleep Study -  CPAP -   Fasting Blood Sugar -  Checks Blood Sugar _____ times a day  Blood Thinner Instructions: Aspirin Instructions:  ERAS Protcol - PRE-SURGERY    COVID vaccine -no  Activity--Able to walk in home with walker with no CP or SOB  Anesthesia review: CHF, COPD, HTN, Blindness, A-fib on previous EKG, 4.3 cm infrarenal aortic aneurysm, pt. On Butrans patch per prescriber pt. Does not need to remove patch. Has private live in caregiver [facility])  Patient denies shortness of breath, fever, cough and chest pain at PAT appointment   All instructions explained to the patient, with a verbal understanding of the material. Patient agrees to go over the instructions while at home for a better understanding. Patient also instructed to self quarantine after being tested for COVID-19. The opportunity to ask questions was provided.

## 2023-01-08 ENCOUNTER — Encounter (HOSPITAL_COMMUNITY): Payer: Self-pay

## 2023-01-08 ENCOUNTER — Encounter (HOSPITAL_COMMUNITY)
Admission: RE | Admit: 2023-01-08 | Discharge: 2023-01-08 | Disposition: A | Discharge status: Home / Self Care | Payer: Medicare Other | Source: Ambulatory Visit | Attending: General Surgery | Admitting: General Surgery

## 2023-01-08 ENCOUNTER — Other Ambulatory Visit: Payer: Self-pay

## 2023-01-08 ENCOUNTER — Telehealth: Payer: Self-pay | Admitting: Pain Medicine

## 2023-01-08 DIAGNOSIS — Z993 Dependence on wheelchair: Secondary | ICD-10-CM | POA: Insufficient documentation

## 2023-01-08 DIAGNOSIS — Z9151 Personal history of suicidal behavior: Secondary | ICD-10-CM | POA: Diagnosis not present

## 2023-01-08 DIAGNOSIS — I7143 Infrarenal abdominal aortic aneurysm, without rupture: Secondary | ICD-10-CM | POA: Diagnosis not present

## 2023-01-08 DIAGNOSIS — F32A Depression, unspecified: Secondary | ICD-10-CM | POA: Diagnosis not present

## 2023-01-08 DIAGNOSIS — H548 Legal blindness, as defined in USA: Secondary | ICD-10-CM | POA: Diagnosis not present

## 2023-01-08 DIAGNOSIS — Z87891 Personal history of nicotine dependence: Secondary | ICD-10-CM | POA: Insufficient documentation

## 2023-01-08 DIAGNOSIS — F419 Anxiety disorder, unspecified: Secondary | ICD-10-CM | POA: Insufficient documentation

## 2023-01-08 DIAGNOSIS — J449 Chronic obstructive pulmonary disease, unspecified: Secondary | ICD-10-CM | POA: Diagnosis not present

## 2023-01-08 DIAGNOSIS — C4362 Malignant melanoma of left upper limb, including shoulder: Secondary | ICD-10-CM | POA: Insufficient documentation

## 2023-01-08 DIAGNOSIS — I509 Heart failure, unspecified: Secondary | ICD-10-CM | POA: Insufficient documentation

## 2023-01-08 DIAGNOSIS — G894 Chronic pain syndrome: Secondary | ICD-10-CM | POA: Insufficient documentation

## 2023-01-08 DIAGNOSIS — Z01812 Encounter for preprocedural laboratory examination: Secondary | ICD-10-CM | POA: Insufficient documentation

## 2023-01-08 DIAGNOSIS — I10 Essential (primary) hypertension: Secondary | ICD-10-CM

## 2023-01-08 DIAGNOSIS — I110 Hypertensive heart disease with heart failure: Secondary | ICD-10-CM | POA: Insufficient documentation

## 2023-01-08 HISTORY — DX: Cardiac arrhythmia, unspecified: I49.9

## 2023-01-08 HISTORY — DX: Depression, unspecified: F32.A

## 2023-01-08 HISTORY — DX: Heart failure, unspecified: I50.9

## 2023-01-08 HISTORY — DX: Pneumonia, unspecified organism: J18.9

## 2023-01-08 HISTORY — DX: Unspecified displaced fracture of second cervical vertebra, initial encounter for closed fracture: S12.100A

## 2023-01-08 HISTORY — DX: Unspecified osteoarthritis, unspecified site: M19.90

## 2023-01-08 HISTORY — DX: Essential (primary) hypertension: I10

## 2023-01-08 LAB — BASIC METABOLIC PANEL
Anion gap: 8 (ref 5–15)
BUN: 21 mg/dL (ref 8–23)
CO2: 27 mmol/L (ref 22–32)
Calcium: 9 mg/dL (ref 8.9–10.3)
Chloride: 105 mmol/L (ref 98–111)
Creatinine, Ser: 1.11 mg/dL (ref 0.61–1.24)
GFR, Estimated: >60 mL/min (ref >60)
Glucose, Bld: 112 mg/dL — ABNORMAL HIGH (ref 70–99)
Potassium: 5.1 mmol/L (ref 3.5–5.1)
Sodium: 140 mmol/L (ref 135–145)

## 2023-01-08 LAB — CBC
HCT: 39.3 % (ref 39.0–52.0)
Hemoglobin: 11.7 g/dL — ABNORMAL LOW (ref 13.0–17.0)
MCH: 28.7 pg (ref 26.0–34.0)
MCHC: 29.8 g/dL — ABNORMAL LOW (ref 30.0–36.0)
MCV: 96.3 fL (ref 80.0–100.0)
Platelets: 155 10*3/uL (ref 150–400)
RBC: 4.08 MIL/uL — ABNORMAL LOW (ref 4.22–5.81)
RDW: 17.4 % — ABNORMAL HIGH (ref 11.5–15.5)
WBC: 3.8 10*3/uL — ABNORMAL LOW (ref 4.0–10.5)
nRBC: 0 % (ref 0.0–0.2)

## 2023-01-08 NOTE — Telephone Encounter (Signed)
Spoke with Candace, informed her that the Buprenorphine patch does not need to be removed for surgery, as long as it is not placed on the area where the surgery will be performed.

## 2023-01-08 NOTE — Telephone Encounter (Signed)
PT caregiver call and stated that patient will be having surgery done. The provider wants to know if the patch that the patient use needs to be remove. Please give Candice a call. TY

## 2023-01-09 ENCOUNTER — Encounter (HOSPITAL_COMMUNITY): Payer: Self-pay

## 2023-01-09 NOTE — Progress Notes (Signed)
Choose an anesthesia record to view details        DISCUSSION: Aaron Gibbs is an 88 yo male who presents to PAT prior to excision of left forearm melanoma, skin grafting, and lymph node biopsy on 01/11/23 with Dr. Donell Beers.   No prior anesthesia complications.  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  Patient follows with PCP for chronic medical conditions. Last seen on 7/10. Pre-op clearance letter received:   "Based on the proposed surgical procedure alone, this is considered a Low risk procedure, but increased risk due to anesthesia. This patient has known pre-operative risk factors including: Obesity BMI >30, Congestive Heart Failure w Preserved Ejection Fracture (controlled), COPD Emphysema. Based on all of this information, I would consider patient CLEARED for surgery from a Medical standpoint and he would not require Cardiology clearance due to no active Cardiac disease."  Patient follows with pain management for chronic pain syndrome. He was instructed to continue Buprenorphine patch in the peri-op period.  Patient was found to have incidental AAA measuring 5x 4.3cm on CT abomdne/pelvis on 10/17/22. Vascular consult recommended however patient has not followed up at this time. Discussed with Dr. [name]. Pt will require vascular consult/clearance prior to surgery. [facility] made aware.   VS: BP (!) 177/88   Pulse 63   Temp 36.6 C (Oral)   Resp 17   Ht 5\' 10"  (1.778 m)   Wt 94.3 kg   SpO2 96%   BMI 29.84 kg/m   PROVIDERS: Smitty Cords, DO   LABS: Labs reviewed: Acceptable for surgery. (all labs ordered are listed, but only  abnormal results are displayed)  Labs Reviewed  BASIC METABOLIC PANEL - Abnormal; Notable for the following components:      Result Value   Glucose, Bld 112 (*)    All other components within normal limits  CBC - Abnormal; Notable for the following components:   WBC 3.8 (*)    RBC 4.08 (*)    Hemoglobin 11.7 (*)    MCHC 29.8 (*)    RDW 17.4 (*)    All other components within normal limits     IMAGES:  NM PET scan 12/28/22: IMPRESSION: 1. Hypermetabolic cutaneous and subcutaneous nodules along the medial left forearm and hypermetabolic left axillary lymph node, compatible with metastatic melanoma. 2. 7 mm hypermetabolic nodule along the left antecubital fossa may represent a varix. Additional site of melanoma is not excluded. 3. Vague hypermetabolism associated with the posterior left acetabulum, without a clear CT correlate. Recommend attention on follow-up as metastatic disease cannot be definitively excluded. 4. Hypermetabolic cavitary nodule and peribronchovascular nodularity/consolidation in the lingula, new from 10/10/2022, likely due to atypical or fungal pneumonia. Consider short-term follow-up CT chest without contrast in 3-4 weeks in further evaluation, as clinically indicated. 5. Hypermetabolic right parotid nodule.  Malignancy not excluded. 6. Trace bilateral pleural effusions. 7. 4.3 cm infrarenal aortic aneurysm. Recommend follow-up every 12 months and vascular consultation. This recommendation follows ACR consensus guidelines: White Paper of the ACR Incidental Findings Committee II on Vascular Findings. J Am Coll Radiol 2013; 10:789-794. 8. Aortic atherosclerosis (ICD10-I70.0). Coronary artery calcification.   EKG: n/a   CV:  Echo 02/14/2019:  IMPRESSIONS     1. The left  ventricle has normal systolic function with an ejection  fraction of 60-65%. The cavity size was normal. Left ventricular diastolic  Doppler parameters are consistent with impaired  relaxation.   2. The right ventricle has normal systolic function. The cavity was  normal. There is no increase in right ventricular wall thickness.Unable to  estimate RVSP.   Carotid US 06/04/2018:   IMPRESSION: 1. Mild bilateral carotid bifurcation plaque resulting in less than 50% diameter stenosis. 2.  Antegrade bilateral vertebral arterial flow.    Past Medical History:  Diagnosis Date   Anxiety    Arthritis    Cancer (HCC)    skin and melanoma Left forearm   CHF (congestive heart failure) (HCC)    COPD (chronic obstructive pulmonary disease) (HCC)    Depression    Dysrhythmia    Fracture of cervical vertebra, C2 (HCC)    Glaucoma    Hypertension    Osteoporosis    osteopenia   Pneumonia    Suicide attempt (HCC) (05/13/18) 06/26/2018    Past Surgical History:  Procedure Laterality Date   CHOLECYSTECTOMY     COLONOSCOPY     TRANSURETHRAL RESECTION OF PROSTATE N/A 2008    MEDICATIONS:  acetaminophen (TYLENOL) 500 MG tablet   albuterol (VENTOLIN HFA) 108 (90 Base) MCG/ACT inhaler   bumetanide (BUMEX) 0.5 MG tablet   buprenorphine (BUTRANS) 10 MCG/HR PTWK   [START ON 01/24/2023] buprenorphine (BUTRANS) 10 MCG/HR PTWK   [START ON 02/21/2023] buprenorphine (BUTRANS) 10 MCG/HR PTWK   carvedilol (COREG) 12.5 MG tablet   COSOPT 22.3-6.8 MG/ML ophthalmic solution   diclofenac Sodium (VOLTAREN) 1 % GEL   DULoxetine (CYMBALTA) 30 MG capsule   feeding supplement (ENSURE ENLIVE / ENSURE PLUS) LIQD   finasteride (PROSCAR) 5 MG tablet   gabapentin (NEURONTIN) 300 MG capsule   Iron, Ferrous Sulfate, 325 (65 Fe) MG TABS   latanoprost (XALATAN) 0.005 % ophthalmic solution   Melatonin 5 MG CAPS   OLANZapine (ZYPREXA) 2.5 MG tablet   pantoprazole (PROTONIX) 40 MG tablet   polyethylene glycol (MIRALAX / GLYCOLAX) 17 g packet   tamsulosin (FLOMAX) 0.4 MG CAPS capsule   traZODone (DESYREL) 150 MG tablet   No current facility-administered medications for this encounter.    Marcille Blanco [facility] Surgical Short Stay/Anesthesiology [facility] Phone [phone] 01/09/2023 10:19 AM

## 2023-01-11 ENCOUNTER — Ambulatory Visit (HOSPITAL_BASED_OUTPATIENT_CLINIC_OR_DEPARTMENT_OTHER): Payer: Medicare Other | Admitting: Anesthesiology

## 2023-01-11 ENCOUNTER — Ambulatory Visit (HOSPITAL_COMMUNITY)
Admission: RE | Admit: 2023-01-11 | Discharge: 2023-01-11 | Disposition: A | Discharge status: Home / Self Care | Payer: Medicare Other | Attending: General Surgery | Admitting: General Surgery

## 2023-01-11 ENCOUNTER — Ambulatory Visit (HOSPITAL_COMMUNITY): Payer: Medicare Other | Admitting: Physician Assistant

## 2023-01-11 ENCOUNTER — Other Ambulatory Visit (HOSPITAL_COMMUNITY): Payer: Self-pay | Admitting: General Surgery

## 2023-01-11 ENCOUNTER — Encounter (HOSPITAL_COMMUNITY)
Admission: RE | Disposition: A | Discharge status: Home / Self Care | Payer: Self-pay | Source: Home / Self Care | Attending: General Surgery

## 2023-01-11 ENCOUNTER — Other Ambulatory Visit: Payer: Self-pay

## 2023-01-11 ENCOUNTER — Ambulatory Visit (HOSPITAL_COMMUNITY): Payer: Medicare Other

## 2023-01-11 ENCOUNTER — Telehealth (HOSPITAL_COMMUNITY): Payer: Self-pay

## 2023-01-11 ENCOUNTER — Ambulatory Visit (HOSPITAL_COMMUNITY)
Admission: RE | Admit: 2023-01-11 | Discharge: 2023-01-11 | Disposition: A | Discharge status: Home / Self Care | Payer: Medicare Other | Source: Ambulatory Visit | Attending: General Surgery | Admitting: General Surgery

## 2023-01-11 ENCOUNTER — Encounter (HOSPITAL_COMMUNITY): Payer: Self-pay | Admitting: General Surgery

## 2023-01-11 DIAGNOSIS — Z993 Dependence on wheelchair: Secondary | ICD-10-CM | POA: Diagnosis not present

## 2023-01-11 DIAGNOSIS — I110 Hypertensive heart disease with heart failure: Secondary | ICD-10-CM | POA: Insufficient documentation

## 2023-01-11 DIAGNOSIS — H548 Legal blindness, as defined in USA: Secondary | ICD-10-CM | POA: Insufficient documentation

## 2023-01-11 DIAGNOSIS — I890 Lymphedema, not elsewhere classified: Secondary | ICD-10-CM | POA: Diagnosis not present

## 2023-01-11 DIAGNOSIS — S12110D Anterior displaced Type II dens fracture, subsequent encounter for fracture with routine healing: Secondary | ICD-10-CM | POA: Insufficient documentation

## 2023-01-11 DIAGNOSIS — K219 Gastro-esophageal reflux disease without esophagitis: Secondary | ICD-10-CM | POA: Diagnosis not present

## 2023-01-11 DIAGNOSIS — C4359 Malignant melanoma of other part of trunk: Secondary | ICD-10-CM | POA: Insufficient documentation

## 2023-01-11 DIAGNOSIS — X58XXXD Exposure to other specified factors, subsequent encounter: Secondary | ICD-10-CM | POA: Diagnosis not present

## 2023-01-11 DIAGNOSIS — Z87891 Personal history of nicotine dependence: Secondary | ICD-10-CM | POA: Diagnosis not present

## 2023-01-11 DIAGNOSIS — Z09 Encounter for follow-up examination after completed treatment for conditions other than malignant neoplasm: Secondary | ICD-10-CM | POA: Insufficient documentation

## 2023-01-11 DIAGNOSIS — I509 Heart failure, unspecified: Secondary | ICD-10-CM | POA: Diagnosis not present

## 2023-01-11 DIAGNOSIS — C4362 Malignant melanoma of left upper limb, including shoulder: Secondary | ICD-10-CM | POA: Insufficient documentation

## 2023-01-11 DIAGNOSIS — I5032 Chronic diastolic (congestive) heart failure: Secondary | ICD-10-CM | POA: Insufficient documentation

## 2023-01-11 DIAGNOSIS — J449 Chronic obstructive pulmonary disease, unspecified: Secondary | ICD-10-CM | POA: Insufficient documentation

## 2023-01-11 DIAGNOSIS — D0359 Melanoma in situ of other part of trunk: Secondary | ICD-10-CM

## 2023-01-11 DIAGNOSIS — I10 Essential (primary) hypertension: Secondary | ICD-10-CM

## 2023-01-11 HISTORY — PX: EXCISION MELANOMA WITH SENTINEL LYMPH NODE BIOPSY: SHX5628

## 2023-01-11 SURGERY — EXCISION, MELANOMA, WITH SENTINEL LYMPH NODE BIOPSY
Anesthesia: General | Laterality: Left

## 2023-01-11 MED ORDER — PROPOFOL 10 MG/ML IV BOLUS
INTRAVENOUS | Status: AC
  Filled 2023-01-11: qty 20

## 2023-01-11 MED ORDER — DEXAMETHASONE SODIUM PHOSPHATE 10 MG/ML IJ SOLN
INTRAMUSCULAR | Status: AC
  Filled 2023-01-11: qty 1

## 2023-01-11 MED ORDER — DEXMEDETOMIDINE HCL IN NACL 80 MCG/20ML IV SOLN
INTRAVENOUS | Status: AC
  Filled 2023-01-11: qty 20

## 2023-01-11 MED ORDER — HEMOSTATIC AGENTS (NO CHARGE) OPTIME
TOPICAL | Status: DC | PRN
  Administered 2023-01-11: 1 via TOPICAL

## 2023-01-11 MED ORDER — ALBUTEROL SULFATE HFA 108 (90 BASE) MCG/ACT IN AERS
INHALATION_SPRAY | RESPIRATORY_TRACT | Status: AC
  Filled 2023-01-11: qty 6.7

## 2023-01-11 MED ORDER — ONDANSETRON HCL 4 MG/2ML IJ SOLN
INTRAMUSCULAR | Status: AC
  Filled 2023-01-11: qty 2

## 2023-01-11 MED ORDER — CHLORHEXIDINE GLUCONATE CLOTH 2 % EX PADS: 6.0000 | MEDICATED_PAD | Freq: Once | CUTANEOUS | Status: DC

## 2023-01-11 MED ORDER — PROMETHAZINE HCL 25 MG/ML IJ SOLN: 6.2500 mg | INTRAMUSCULAR | Status: DC | PRN

## 2023-01-11 MED ORDER — OXYCODONE HCL 5 MG PO TABS: 5.0000 mg | ORAL_TABLET | Freq: Once | ORAL | Status: DC | PRN

## 2023-01-11 MED ORDER — TECHNETIUM TC 99M TILMANOCEPT KIT
0.5000 | PACK | Freq: Once | INTRAVENOUS | Status: AC | PRN
  Administered 2023-01-11: 0.5 via INTRADERMAL

## 2023-01-11 MED ORDER — BUPIVACAINE-EPINEPHRINE 0.25% -1:200000 IJ SOLN
INTRAMUSCULAR | Status: AC
  Filled 2023-01-11: qty 1

## 2023-01-11 MED ORDER — LIDOCAINE HCL (PF) 2 % IJ SOLN
INTRAMUSCULAR | Status: AC
  Filled 2023-01-11: qty 5

## 2023-01-11 MED ORDER — FENTANYL CITRATE (PF) 100 MCG/2ML IJ SOLN
INTRAMUSCULAR | Status: AC
  Filled 2023-01-11: qty 2

## 2023-01-11 MED ORDER — LACTATED RINGERS IV SOLN: INTRAVENOUS | Status: DC

## 2023-01-11 MED ORDER — OXYCODONE HCL 5 MG/5ML PO SOLN: 5.0000 mg | Freq: Once | ORAL | Status: DC | PRN

## 2023-01-11 MED ORDER — ACETAMINOPHEN 500 MG PO TABS: 1000.0000 mg | ORAL_TABLET | ORAL | Status: DC

## 2023-01-11 MED ORDER — CHLORHEXIDINE GLUCONATE 0.12 % MT SOLN: 15.0000 mL | Freq: Once | OROMUCOSAL | Status: DC

## 2023-01-11 MED ORDER — METHYLENE BLUE (ANTIDOTE) 1 % IV SOLN
INTRAVENOUS | Status: AC
  Filled 2023-01-11: qty 10

## 2023-01-11 MED ORDER — ORAL CARE MOUTH RINSE: 15.0000 mL | Freq: Once | OROMUCOSAL | Status: DC

## 2023-01-11 MED ORDER — HYDROMORPHONE HCL 1 MG/ML IJ SOLN: 0.2500 mg | INTRAMUSCULAR | Status: DC | PRN

## 2023-01-11 MED ORDER — AMISULPRIDE (ANTIEMETIC) 5 MG/2ML IV SOLN: 10.0000 mg | Freq: Once | INTRAVENOUS | Status: DC | PRN

## 2023-01-11 MED ORDER — PHENYLEPHRINE HCL-NACL 20-0.9 MG/250ML-% IV SOLN
INTRAVENOUS | Status: AC
  Filled 2023-01-11: qty 250

## 2023-01-11 MED ORDER — LIDOCAINE HCL (PF) 1 % IJ SOLN
INTRAMUSCULAR | Status: DC | PRN
  Administered 2023-01-11: 10 mL

## 2023-01-11 MED ORDER — CEFAZOLIN SODIUM-DEXTROSE 2-4 GM/100ML-% IV SOLN
2.0000 g | INTRAVENOUS | Status: DC
  Filled 2023-01-11: qty 100

## 2023-01-11 SURGICAL SUPPLY — 50 items
ADH SKN CLS APL DERMABOND .7 (GAUZE/BANDAGES/DRESSINGS) ×1
APL PRP STRL LF DISP 70% ISPRP (MISCELLANEOUS) ×1
BAG COUNTER SPONGE SURGICOUNT (BAG) IMPLANT
BAG SPNG CNTER NS LX DISP (BAG)
BINDER BREAST LRG (GAUZE/BANDAGES/DRESSINGS) IMPLANT
BLADE SURG 15 STRL LF DISP TIS (BLADE) ×3 IMPLANT
BLADE SURG 15 STRL SS (BLADE)
BNDG CMPR 5X4 CHSV STRCH STRL (GAUZE/BANDAGES/DRESSINGS) ×1
BNDG COHESIVE 1X5 TAN STRL LF (GAUZE/BANDAGES/DRESSINGS) IMPLANT
BNDG COHESIVE 4X5 TAN STRL LF (GAUZE/BANDAGES/DRESSINGS) ×1 IMPLANT
CHLORAPREP W/TINT 26 (MISCELLANEOUS) ×1 IMPLANT
CLIP TI MEDIUM 6 (CLIP) ×3 IMPLANT
CLIP TI WIDE RED SMALL 6 (CLIP) ×2 IMPLANT
COVER MAYO STAND STRL (DRAPES) ×1 IMPLANT
COVER SURGICAL LIGHT HANDLE (MISCELLANEOUS) ×1 IMPLANT
DERMABOND ADVANCED .7 DNX12 (GAUZE/BANDAGES/DRESSINGS) ×1 IMPLANT
DRAIN CHANNEL 19F RND (DRAIN) IMPLANT
DRAPE UTILITY XL STRL (DRAPES) ×1 IMPLANT
DRSG ADAPTIC 3X8 NADH LF (GAUZE/BANDAGES/DRESSINGS) IMPLANT
DRSG TELFA 3X8 NADH STRL (GAUZE/BANDAGES/DRESSINGS) IMPLANT
ELECT REM PT RETURN 15FT ADLT (MISCELLANEOUS) ×1 IMPLANT
EVACUATOR DRAINAGE 10X20 100CC (DRAIN) IMPLANT
EVACUATOR SILICONE 100CC (DRAIN)
GAUZE 4X4 16PLY ~~LOC~~+RFID DBL (SPONGE) ×1 IMPLANT
GAUZE SPONGE 4X4 12PLY STRL (GAUZE/BANDAGES/DRESSINGS) ×1 IMPLANT
GLOVE BIO SURGEON STRL SZ 6 (GLOVE) ×1 IMPLANT
GLOVE BIOGEL PI IND STRL 7.0 (GLOVE) ×1 IMPLANT
GLOVE INDICATOR 6.5 STRL GRN (GLOVE) ×1 IMPLANT
HEMOSTAT SURGICEL 2X3 (HEMOSTASIS) IMPLANT
ILLUMINATOR WAVEGUIDE N/F (MISCELLANEOUS) IMPLANT
KIT BASIN OR (CUSTOM PROCEDURE TRAY) ×1 IMPLANT
KIT TURNOVER KIT A (KITS) IMPLANT
LIGHT WAVEGUIDE WIDE FLAT (MISCELLANEOUS) IMPLANT
MARKER SKIN DUAL TIP RULER LAB (MISCELLANEOUS) ×1 IMPLANT
NDL HYPO 22X1.5 SAFETY MO (MISCELLANEOUS) ×1 IMPLANT
NDL HYPO 25X1 1.5 SAFETY (NEEDLE) ×1 IMPLANT
NEEDLE HYPO 22X1.5 SAFETY MO (MISCELLANEOUS)
NEEDLE HYPO 25X1 1.5 SAFETY (NEEDLE)
PACK UNIVERSAL I (CUSTOM PROCEDURE TRAY) ×1 IMPLANT
PENCIL SMOKE EVACUATOR (MISCELLANEOUS) IMPLANT
SPIKE FLUID TRANSFER (MISCELLANEOUS) ×1 IMPLANT
STOCKINETTE 6 STRL (DRAPES) ×1 IMPLANT
STRIP CLOSURE SKIN 1/2X4 (GAUZE/BANDAGES/DRESSINGS) IMPLANT
SUT ETHILON 2 0 PS N (SUTURE) IMPLANT
SUT MNCRL AB 4-0 PS2 18 (SUTURE) ×1 IMPLANT
SUT VIC AB 3-0 SH 27 (SUTURE)
SUT VIC AB 3-0 SH 27X BRD (SUTURE) ×3 IMPLANT
SYR BULB IRRIG 60ML STRL (SYRINGE) ×1 IMPLANT
SYR CONTROL 10ML LL (SYRINGE) ×1 IMPLANT
TOWEL OR 17X26 10 PK STRL BLUE (TOWEL DISPOSABLE) ×1 IMPLANT

## 2023-01-11 NOTE — Telephone Encounter (Signed)
Called to schedule picc placement, no answer, left vm. AB

## 2023-01-11 NOTE — Op Note (Signed)
PRE-OPERATIVE DIAGNOSIS: melanoma left forearm  POST-OPERATIVE DIAGNOSIS:  Same  PROCEDURE:  Procedure(s): Cauterization of bleeding wound.    SURGEON:  Surgeon(s): [name], MD  ANESTHESIA:   local  DRAINS: none   LOCAL MEDICATIONS USED:  BUPIVICAINE  and LIDOCAINE   SPECIMEN:  No Specimen  DISPOSITION OF SPECIMEN:  N/A  DICTATION: .Dragon Dictation  PLAN OF CARE: Discharge to home after PACU  PATIENT DISPOSITION:  PACU - hemodynamically stable.  FINDINGS:  bleeding melanoma left forearm  EBL: min  PROCEDURE:  Patient was identified in the holding area and taken to the operating room where he was placed on the operating room table.  It became immediately apparent that his IV had infiltrated.  Anesthesia tried multiple attempts to get additional IV which were unsuccessful.  The patient has a cervical spine fracture and is in a cervical collar and therefore internal jugular central line was not possible.  I set him up to try to get a groin central line and were not able to get him position flat enough to be able to access his inguinal vessels.  Even with ultrasound was not able to get a good view of the vessels.  We prepped out his left upper chest to attempt a subclavian line and his clavicle was extremely thick.  The patient was not able to tolerate this despite local anesthetic.  Every attempt to position him in better location for accessing the central vessel was unsuccessful due to pain.  All movements led to him complaining severely about discomfort.  I could not safely access the subclavian vessel so attempts were aborted.  I did take down the dressing on his left forearm and did see the hemorrhaging that was occurring there.  We cleaned the wound with Betadine and I infiltrated this with local anesthetic.  With cautery I was able to stop the bleeding.  The wound was then redressed with Surgicel, Adaptic, Telfa, gauze, and Coban.  The patient was taken back to the PACU.

## 2023-01-11 NOTE — Anesthesia Postprocedure Evaluation (Signed)
Anesthesia Post Note  Patient: Aaron Gibbs  Procedure(s) Performed: Cauterization of bleeding wound on left forearm (Left)     Patient location during evaluation: PACU Anesthesia Type: General Level of consciousness: awake and alert Pain management: pain level controlled Vital Signs Assessment: post-procedure vital signs reviewed and stable Respiratory status: spontaneous breathing, nonlabored ventilation and respiratory function stable Cardiovascular status: blood pressure returned to baseline and stable Postop Assessment: no apparent nausea or vomiting Anesthetic complications: no   No notable events documented.  Last Vitals:  Vitals:   01/11/23 1300 01/11/23 1315  BP: (!) 187/79 (!) 184/80  Pulse: (!) 58 63  Resp: 15   Temp: (!) 36.4 C   SpO2: 95% 94%    Last Pain:  Vitals:   01/11/23 1315  TempSrc:   PainSc: 0-No pain                 Lowella Curb

## 2023-01-11 NOTE — Interval H&P Note (Signed)
History and Physical Interval Note:  01/11/2023 8:47 AM  Aaron Gibbs  has presented today for surgery, with the diagnosis of LEFT FOREARM MELANOMA.  The various methods of treatment have been discussed with the patient and family. After consideration of risks, benefits and other options for treatment, the patient has consented to  Procedure(s): EXCISWIDE LOCAL EXCISION LEFT FOREARM MELANOMA SITES WITH SKIN SUBSTITUTE COVERAGE, SENTINEL LYMPH NODE BIOPSY (N/A) as a surgical intervention.  The patient's history has been reviewed, patient examined, no change in status, stable for surgery.  I have reviewed the patient's chart and labs.  Questions were answered to the patient's satisfaction.     [name]

## 2023-01-11 NOTE — Anesthesia Preprocedure Evaluation (Signed)
Anesthesia Evaluation  Patient identified by MRN, date of birth, ID band Patient awake    Reviewed: Allergy & Precautions, H&P , NPO status , Patient's Chart, lab work & pertinent test results  Airway Mallampati: II  TM Distance: >3 FB Neck ROM: Full    Dental no notable dental hx.    Pulmonary COPD, former smoker   Pulmonary exam normal breath sounds clear to auscultation       Cardiovascular hypertension, Pt. on medications + Peripheral Vascular Disease and +CHF  Normal cardiovascular exam Rhythm:Regular Rate:Normal     Neuro/Psych   Anxiety Depression    negative neurological ROS  negative psych ROS   GI/Hepatic Neg liver ROS,GERD  ,,  Endo/Other  negative endocrine ROS    Renal/GU negative Renal ROS  negative genitourinary   Musculoskeletal  (+) Arthritis , Osteoarthritis,    Abdominal   Peds negative pediatric ROS (+)  Hematology negative hematology ROS (+)   Anesthesia Other Findings Metastatic Melanoma  Reproductive/Obstetrics negative OB ROS                             Anesthesia Physical Anesthesia Plan  ASA: 4  Anesthesia Plan: General   Post-op Pain Management: Regional block*   Induction: Intravenous  PONV Risk Score and Plan: 2 and Ondansetron, Midazolam and Treatment may vary due to age or medical condition  Airway Management Planned: LMA  Additional Equipment:   Intra-op Plan:   Post-operative Plan: Extubation in OR  Informed Consent: I have reviewed the patients History and Physical, chart, labs and discussed the procedure including the risks, benefits and alternatives for the proposed anesthesia with the patient or authorized representative who has indicated his/her understanding and acceptance.     Dental advisory given  Plan Discussed with: CRNA  Anesthesia Plan Comments:        Anesthesia Quick Evaluation

## 2023-01-11 NOTE — Transfer of Care (Signed)
Immediate Anesthesia Transfer of Care Note  Patient: Aaron Gibbs  Procedure(s) Performed: EXCISWIDE LOCAL EXCISION LEFT FOREARM MELANOMA SITES WITH SKIN SUBSTITUTE COVERAGE, SENTINEL LYMPH NODE BIOPSY  Patient Location: PACU  Anesthesia Type:MAC  Level of Consciousness: awake  Airway & Oxygen Therapy: Patient Spontanous Breathing and Patient connected to face mask oxygen  Post-op Assessment: Report given to RN and Post -op Vital signs reviewed and stable  Post vital signs: Reviewed and stable  Last Vitals:  Vitals Value Taken Time  BP 215/99 01/11/23 1223  Temp    Pulse 60 01/11/23 1225  Resp 17 01/11/23 1225  SpO2 100 % 01/11/23 1225  Vitals shown include unfiled device data.  Last Pain:  Vitals:   01/11/23 0815  TempSrc: Axillary  PainSc:          Complications: No notable events documented.

## 2023-01-11 NOTE — Discharge Instructions (Addendum)
Change dressing daily with surgical lubrication, non stick gauze, gauze, and wrap with Coban or Kerlix.

## 2023-01-11 NOTE — H&P (Signed)
REFERRING PHYSICIAN: Unknown PROVIDER: Matthias Hughs, MD MRN: [id number] DOB: 1934-10-11 DATE OF ENCOUNTER: 12/15/2022 Subjective   Chief Complaint: New Consultation (Metastatic Melanoma)  History of Present Illness: Aaron Gibbs is a 88 y.o. male who is seen today as an office consultation for evaluation of New Consultation (Metastatic Melanoma)  Patient presents with a visible and palpable nodule in the left forearm. He is a resident at a skilled nursing facility. He was sent to dermatology with this clinically apparent lesion. There are 3 spots on his left forearm and one of them was biopsied at [facility] dermatology. This showed a metastatic melanoma. This was demonstrated to be an in-transit dermal deposit. The one that was biopsied was 3 mm in greatest dimension and had multiple positive margins. A Breslow depth was not given as it was a in-transit metastasis. He has 2 other lesions nearby that are present but are not biopsied at this point. These are presumed the same pathology.  The patient has a history of melanoma on the right forearm but he thinks that this is around 20+ years ago. He does not know of any other skin lesions on his arm. At the same time they did biopsy something on his upper abdomen that was melanoma in situ.  Of note, the patient is in a soft collar with a mildly displaced type II dens fracture that is unchanged on recent imaging from June 1. The patient is also in a wheelchair, but can do transfers himself. He states that he is legally blind.  Review of Systems: A complete review of systems was obtained from the patient. I have reviewed this information and discussed as appropriate with the patient. See HPI as well for other ROS.  Review of Systems  Musculoskeletal: Positive for neck pain.  Neurological: Positive for weakness.    Medical History: Past Medical History:  Diagnosis Date  Arthritis  COPD (chronic obstructive pulmonary disease)  (CMS/HHS-HCC)  GERD (gastroesophageal reflux disease)  Hypertension   Patient Active Problem List  Diagnosis  In-transit metastasis from malignant melanoma of skin (CMS/HHS-HCC)  Chronic heart failure with preserved ejection fraction (CMS/HHS-HCC)  C2 cervical fracture (CMS/HHS-HCC)  COPD (chronic obstructive pulmonary disease) (CMS/HHS-HCC)  COVID-19  Infrarenal abdominal aortic aneurysm (AAA) without rupture (CMS-HCC)  Melanoma in situ of trunk (CMS/HHS-HCC)  Decreased functional activity tolerance  Lymphedema of arm   History reviewed. No pertinent surgical history.   Allergies  Allergen Reactions  Anthralin Shortness Of Breath  Azithromycin Shortness Of Breath   No current outpatient medications on file prior to visit.   No current facility-administered medications on file prior to visit.   Family History  Problem Relation Age of Onset  Depression Mother  Coronary Artery Disease (Blocked arteries around heart) Father    Social History   Tobacco Use  Smoking Status Former  Types: Cigarettes  Smokeless Tobacco Never    Social History   Socioeconomic History  Marital status: Divorced  Tobacco Use  Smoking status: Former  Types: Cigarettes  Smokeless tobacco: Never  Substance and Sexual Activity  Alcohol use: Yes  Drug use: Never   Social Determinants of Health   Financial Resource Strain: Low Risk (07/13/2022)  Received from [name]  Overall Financial Resource Strain (CARDIA)  Difficulty of Paying Living Expenses: Not hard at all  Food Insecurity: No Food Insecurity (10/11/2022)  Received from [facility]  Hunger Vital Sign  Worried About Running Out of Food in the Last Year: Never true  Ran Out  of Food in the Last Year: Never true  Transportation Needs: No Transportation Needs (10/11/2022)  Received from [facility] - Transportation  Lack of Transportation (Medical): No  Lack of Transportation (Non-Medical): No  Physical Activity:  Inactive (07/13/2022)  Received from [facility]  Exercise Vital Sign  Days of Exercise per Week: 0 days  Minutes of Exercise per Session: 0 min  Stress: Stress Concern Present (07/13/2022)  Received from [facility] - Occupational Stress Questionnaire  Feeling of Stress : Rather much  Social Connections: Socially Isolated (07/13/2022)  Received from [facility]  Social Connection and Isolation Panel [NHANES]  Frequency of Communication with Friends and Family: More than three times a week  Frequency of Social Gatherings with Friends and Family: Twice a week  Attends Religious Services: Never  Database administrator or Organizations: No  Attends Engineer, structural: Never  Marital Status: Divorced   Objective:   Vitals:  12/15/22 1000  BP: (!) 150/84  Pulse: 90  Temp: 36.1 C (97 F)  SpO2: 96%  Weight: (!) 108.9 kg (240 lb)  Height: 180.3 cm (5\' 11" )   Body mass index is 33.47 kg/m.  Head: Normocephalic and atraumatic. Very frail looking. In soft cervical collar Mouth/Throat: Oropharynx is clear and moist. No oropharyngeal exudate.  Eyes: Conjunctivae are normal. Pupils are equal, round, and reactive to light. No scleral icterus.  Neck: Neck supple. No tracheal deviation present. No thyromegaly present. No palpable LAD Cardiovascular: Normal rate, regular rhythm, normal heart sounds and intact distal pulses. Exam reveals no gallop and no friction rub.  No murmur heard. Respiratory: Effort normal and breath sounds normal. No respiratory distress. No wheezes, rales or rhonchi. No chest wall tenderness.  GI: Soft. Bowel sounds are normal. Abdomen is soft, non tender, non distended. No masses or hepatosplenomegaly is present. There is no rebound and no guarding.  Musculoskeletal: . Extremities are non tender. There are two palpable lesions on the left forearm and the other site was removed with biopsy. See picture. One is  exophytic (2.5 cm) and the other is covered by the skin proximal and near the dominant lesion (1 cm). The third is under the bandage.  Lymphadenopathy: No cervical or axillary adenopathy. Question left epitrochlear adenopathy at elbow. + lymphedema left arm.  Neurological: Alert and oriented to person, place, and time. Coordination normal.  Skin: Skin is warm and dry. No rash noted. No diaphoresis. No erythema. No pallor.  Psychiatric: Normal mood and affect.Behavior is normal. Judgment and thought content normal.   Labs, Imaging and Diagnostic Testing: Labs 10/23/22 with CBC showing HCT 31.5 and WBCs 3.9k BMET with Cl 112 and gluc 119.   Assessment and Plan:   Diagnoses and all orders for this visit:  In-transit metastasis from malignant melanoma of skin (CMS/HHS-HCC) - PET whole body FDG (F18 fluorodeoxyglucose) - V7846; Future  Melanoma in situ of trunk (CMS/HHS-HCC)  Decreased functional activity tolerance  Lymphedema of arm  Patient has at least 3 sites of metastatic melanoma/in-transit metastasis on his left forearm. This is accompanied by lymphedema of the left arm. The dominant mass as seen in the picture does feel like it extends more underneath the skin. In greatest dimension this feels like its about 3 x 5 cm.  This is very suspicious for another primary and or metastatic melanoma. I am concerned with the lymphedema that there may be additional sites of disease. I am ordering a PET scan  to evaluate this. If the patient does have distant metastatic disease, I would refer him to medical oncology first. If not, I would plan to excise these lesions. This will be difficult for this patient as his skin is thin and with the lymphedema he is at high risk for wound dehiscence. He would likely need a skin substitute bridge as I think this would be quite tight to take everything out.  The abdominal wall lesion also could use reexcision, but the left arm is clearly the dominant  problem.  The patient does inquire what if he does not do anything. Certainly at his age with comorbidities and decreased functional status, observation is a option for the patient.  We will get dermatology visit notes as well as the PET scan and make surgical plans if the patient is amenable.  We are sending a request for surgical clearance to his primary care physician. He may need cardiology evaluation.  Matthias Hughs, MD

## 2023-01-12 ENCOUNTER — Encounter (HOSPITAL_COMMUNITY): Payer: Self-pay | Admitting: General Surgery

## 2023-01-15 ENCOUNTER — Ambulatory Visit: Payer: Medicare Other | Admitting: Podiatry

## 2023-01-18 ENCOUNTER — Telehealth: Payer: Self-pay | Admitting: Family Medicine

## 2023-01-18 NOTE — Telephone Encounter (Signed)
Patient's daughter, [name], has called in regards to she is requesting a list for all of patient's diagnoses for patient to be able to qualify for VA benefits. Per [name], she stated the copy of the diagnoses needs to go to patient and patient will fill out the forms for the [place]. Please advise.   Patients daughters callback #:  [phone]

## 2023-01-19 ENCOUNTER — Ambulatory Visit (HOSPITAL_COMMUNITY)
Admission: RE | Admit: 2023-01-19 | Discharge: 2023-01-19 | Disposition: A | Discharge status: Home / Self Care | Payer: Medicare Other | Source: Ambulatory Visit | Attending: General Surgery | Admitting: General Surgery

## 2023-01-19 ENCOUNTER — Encounter (HOSPITAL_COMMUNITY): Payer: Self-pay | Admitting: General Surgery

## 2023-01-19 DIAGNOSIS — D0359 Melanoma in situ of other part of trunk: Secondary | ICD-10-CM | POA: Diagnosis not present

## 2023-01-19 MED ORDER — HEPARIN SOD (PORK) LOCK FLUSH 100 UNIT/ML IV SOLN
INTRAVENOUS | Status: AC
  Filled 2023-01-19: qty 5

## 2023-01-19 MED ORDER — HEPARIN SOD (PORK) LOCK FLUSH 100 UNIT/ML IV SOLN
500.0000 [IU] | Freq: Once | INTRAVENOUS | Status: AC
  Administered 2023-01-19: 500 [IU] via INTRAVENOUS

## 2023-01-19 MED ORDER — LIDOCAINE HCL 1 % IJ SOLN
INTRAMUSCULAR | Status: AC
  Filled 2023-01-19: qty 20

## 2023-01-19 MED ORDER — LIDOCAINE HCL 1 % IJ SOLN
20.0000 mL | Freq: Once | INTRAMUSCULAR | Status: AC
  Administered 2023-01-19: 3 mL

## 2023-01-19 NOTE — Procedures (Signed)
PROCEDURE SUMMARY:  Successful placement of image-guided sing;e lumen PICC line to the right brachial vein. Length 42 cm. Tip at lower SVC/RA. No complications. EBL = < 1 mL. Aspirated, flushed, heparin locked, sterile dressing applied.   Ready for immediate use.  Please see imaging section of Epic for full dictation.   Villa Herb PA-C 01/19/2023 9:35 AM

## 2023-01-25 ENCOUNTER — Other Ambulatory Visit: Payer: Self-pay | Admitting: General Surgery

## 2023-01-25 ENCOUNTER — Ambulatory Visit (INDEPENDENT_AMBULATORY_CARE_PROVIDER_SITE_OTHER): Payer: Medicare Other | Admitting: Podiatry

## 2023-01-25 DIAGNOSIS — L600 Ingrowing nail: Secondary | ICD-10-CM

## 2023-01-25 DIAGNOSIS — M79674 Pain in right toe(s): Secondary | ICD-10-CM | POA: Diagnosis not present

## 2023-01-25 DIAGNOSIS — B351 Tinea unguium: Secondary | ICD-10-CM

## 2023-01-25 DIAGNOSIS — M79675 Pain in left toe(s): Secondary | ICD-10-CM | POA: Diagnosis not present

## 2023-01-25 DIAGNOSIS — C4362 Malignant melanoma of left upper limb, including shoulder: Secondary | ICD-10-CM

## 2023-01-25 NOTE — Progress Notes (Signed)
  Subjective:  Patient ID: Aaron Gibbs, male    DOB: [birth year],  MRN: [id number]  Chief Complaint  Patient presents with   Nail Problem    New PT rfc-nail trim    88 y.o. male presents with the above complaint. History confirmed with patient.  Nails are thickened and elongated, feel ingrown especially in the big toes but the remainder are bothersome as well.  He is blind and cannot safely cut them   Objective:  Physical Exam: warm, good capillary refill, no trophic changes or ulcerative lesions, normal DP and PT pulses, and normal sensory exam. Left Foot: dystrophic yellowed discolored nail plates with subungual debris Right Foot: dystrophic yellowed discolored nail plates with subungual debris   Assessment:   1. Pain due to onychomycosis of toenails of both feet   2. Ingrowing left great toenail   3. Ingrowing right great toenail      Plan:  Patient was evaluated and treated and all questions answered.  Discussed the etiology and treatment options for the condition in detail with the patient.  Recommended debridement of the nails today. Sharp and mechanical debridement performed of all painful and mycotic nails today. Nails debrided in length and thickness using a nail nipper to level of comfort. Discussed treatment options including appropriate shoe gear. Follow up as needed for painful nails.   Return if symptoms worsen or fail to improve.

## 2023-01-26 ENCOUNTER — Telehealth: Payer: Medicare Other | Admitting: Internal Medicine

## 2023-01-26 ENCOUNTER — Encounter: Payer: Self-pay | Admitting: Internal Medicine

## 2023-01-26 ENCOUNTER — Other Ambulatory Visit: Payer: Self-pay | Admitting: Internal Medicine

## 2023-01-26 ENCOUNTER — Ambulatory Visit: Payer: Self-pay

## 2023-01-26 DIAGNOSIS — I10 Essential (primary) hypertension: Secondary | ICD-10-CM

## 2023-01-26 MED ORDER — OLMESARTAN MEDOXOMIL 20 MG PO TABS: 20.0000 mg | ORAL_TABLET | Freq: Every day | ORAL | 0 refills | Status: DC

## 2023-01-26 NOTE — Patient Instructions (Signed)
How to Take Your Blood Pressure Blood pressure is a measurement of how strongly your blood is pressing against the walls of your arteries. Arteries are blood vessels that carry blood from your heart throughout your body. Your health care provider takes your blood pressure at each office visit. You can also take your own blood pressure at home with a blood pressure monitor. You may need to take your own blood pressure to: Confirm a diagnosis of high blood pressure (hypertension). Monitor your blood pressure over time. Make sure your blood pressure medicine is working. Supplies needed: Blood pressure monitor. A chair to sit in. This should be a chair where you can sit upright with your back supported. Do not sit on a soft couch or an armchair. Table or desk. Small notebook and pencil or pen. How to prepare To get the most accurate reading, avoid the following for 30 minutes before you check your blood pressure: Drinking caffeine. Drinking alcohol. Eating. Smoking. Exercising. Five minutes before you check your blood pressure: Use the bathroom and urinate so that you have an empty bladder. Sit quietly in a chair. Do not talk. How to take your blood pressure To check your blood pressure, follow the instructions in the manual that came with your blood pressure monitor. If you have a digital blood pressure monitor, the instructions may be as follows: Sit up straight in a chair. Place your feet on the floor. Do not cross your ankles or legs. Rest your left arm at the level of your heart on a table or desk or on the arm of a chair. Pull up your shirt sleeve. Wrap the blood pressure cuff around the upper part of your left arm, 1 inch (2.5 cm) above your elbow. It is best to wrap the cuff around bare skin. Fit the cuff snugly, but not too tightly, around your arm. You should be able to place only one finger between the cuff and your arm. Position the cord so that it rests in the bend of your  elbow. Press the power button. Sit quietly while the cuff inflates and deflates. Read the digital reading on the monitor screen and write the numbers down (record them) in a notebook. Wait 2-3 minutes, then repeat the steps, starting at step 1. What does my blood pressure reading mean? A blood pressure reading consists of a higher number over a lower number. Ideally, your blood pressure should be below 120/80. The first ("top") number is called the systolic pressure. It is a measure of the pressure in your arteries as your heart beats. The second ("bottom") number is called the diastolic pressure. It is a measure of the pressure in your arteries as the heart relaxes. Blood pressure is classified into four stages. The following are the stages for adults who do not have a short-term serious illness or a chronic condition. Systolic pressure and diastolic pressure are measured in a unit called mm Hg (millimeters of mercury).  Normal Systolic pressure: below 120. Diastolic pressure: below 80. Elevated Systolic pressure: 120-129. Diastolic pressure: below 80. Hypertension stage 1 Systolic pressure: 130-139. Diastolic pressure: 80-89. Hypertension stage 2 Systolic pressure: 140 or above. Diastolic pressure: 90 or above. You can have elevated blood pressure or hypertension even if only the systolic or only the diastolic number in your reading is higher than normal. Follow these instructions at home: Medicines Take over-the-counter and prescription medicines only as told by your health care provider. Tell your health care provider if you are having any  side effects from blood pressure medicine. General instructions Check your blood pressure as often as recommended by your health care provider. Check your blood pressure at the same time every day. Take your monitor to the next appointment with your health care provider to make sure that: You are using it correctly. It provides accurate  readings. Understand what your goal blood pressure numbers are. Keep all follow-up visits. This is important. General tips Your health care provider can suggest a reliable monitor that will meet your needs. There are several types of home blood pressure monitors. Choose a monitor that has an arm cuff. Do not choose a monitor that measures your blood pressure from your wrist or finger. Choose a cuff that wraps snugly, not too tight or too loose, around your upper arm. You should be able to fit only one finger between your arm and the cuff. You can buy a blood pressure monitor at most drugstores or online. Where to find more information American Heart Association: www.heart.org Contact a health care provider if: Your blood pressure is consistently high. Your blood pressure is suddenly low. Get help right away if: Your systolic blood pressure is higher than 180. Your diastolic blood pressure is higher than 120. These symptoms may be an emergency. Get help right away. Call 911. Do not wait to see if the symptoms will go away. Do not drive yourself to the hospital. Summary Blood pressure is a measurement of how strongly your blood is pressing against the walls of your arteries. A blood pressure reading consists of a higher number over a lower number. Ideally, your blood pressure should be below 120/80. Check your blood pressure at the same time every day. Avoid caffeine, alcohol, smoking, and exercise for 30 minutes prior to checking your blood pressure. These agents can affect the accuracy of the blood pressure reading. This information is not intended to replace advice given to you by your health care provider. Make sure you discuss any questions you have with your health care provider. Document Revised: 02/17/2021 Document Reviewed: 02/17/2021 Elsevier Patient Education  2024 [employer].

## 2023-01-26 NOTE — Telephone Encounter (Signed)
Chief Complaint: High BP Symptoms: High BP readings  Frequency: comes and goes  Pertinent Negatives: Patient denies chest pain, headache, weakness, slurred speech Disposition: [] ED /[] Urgent Care (no appt availability in office) / [] Appointment(In office/virtual)/ []  [facility]/ [] Home Care/ [x] Refused Recommended Disposition /[] [facility]/ []  Follow-up with PCP Additional Notes: Patient on the line with home health physical therapist. PT stated that the patient had 3 elevated blood pressure readings during his therapy session. Patient was given care advised and was offered a MyChart virtual visit. Patient stated he will not be able to access his MyChart and would like a telephone visit. Advised patient that I could not schedule a telephone visit but would forward the message to provider for additional recommendations. Patient denies any symptoms at this time.   Reason for Disposition  Systolic BP  >= 160 OR Diastolic >= 100  Answer Assessment - Initial Assessment Questions 1. BLOOD PRESSURE: "What is the blood pressure?" "Did you take at least two measurements 5 minutes apart?"     174/105 and the next 164/107, on 170/101 2. ONSET: "When did you take your blood pressure?"     Today about ago  3. HOW: "How did you take your blood pressure?" (e.g., automatic home BP monitor, visiting nurse)     Home BP monitor 4. HISTORY: "Do you have a history of high blood pressure?"     Yes  5. MEDICINES: "Are you taking any medicines for blood pressure?" "Have you missed any doses recently?"     Yes I did take my medicine I have not missed any medication  6. OTHER SYMPTOMS: "Do you have any symptoms?" (e.g., blurred vision, chest pain, difficulty breathing, headache, weakness)     Pain 7/10 neck, left knee had a fracture  Protocols used: Blood Pressure - High-A-AH

## 2023-01-26 NOTE — Progress Notes (Signed)
Virtual Visit via Video Note  I connected with Aaron Gibbs on 01/26/23 at  2:40 PM EDT by a video enabled telemedicine application and verified that I am speaking with the correct person using two identifiers.  Location: Patient: Home Provider: Home  Persons participating in this video call: Nicki Reaper, NP and Aaron Gibbs   I discussed the limitations of evaluation and management by telemedicine and the availability of in person appointments. The patient expressed understanding and agreed to proceed.  History of Present Illness:  Pt reports elevated blood pressures.  He reports physical therapy was at his house today and took his blood pressure on 3 different times and it was: 174/105, 164/107 and 170/101.  He has a history of CHF, HTN managed on bumex and carvedilol.  He currently denies headache, vision changes, chest pain, chest pressure, shortness of breath or near syncope.  He reports he is having chronic pain, mainly in his neck and knees.  He reports he is able to check his blood pressure at home.  Past Medical History:  Diagnosis Date   Anxiety    Arthritis    Cancer (HCC)    skin and melanoma Left forearm   CHF (congestive heart failure) (HCC)    COPD (chronic obstructive pulmonary disease) (HCC)    Depression    Dysrhythmia    Fracture of cervical vertebra, C2 (HCC)    Glaucoma    Hypertension    Osteoporosis    osteopenia   Pneumonia    Suicide attempt (HCC) (05/13/18) 06/26/2018    Current Outpatient Medications  Medication Sig Dispense Refill   acetaminophen (TYLENOL) 500 MG tablet Take 1,000 mg by mouth in the morning, at noon, and at bedtime.     albuterol (VENTOLIN HFA) 108 (90 Base) MCG/ACT inhaler Inhale 2 puffs into the lungs every 4 (four) hours as needed for wheezing or shortness of breath.     bumetanide (BUMEX) 0.5 MG tablet Take 1 tablet (0.5 mg total) by mouth 2 (two) times daily. (Patient taking differently: Take 1 mg by mouth 2 (two) times  daily.) 180 tablet 1   buprenorphine (BUTRANS) 10 MCG/HR PTWK Place 1 patch onto the skin once a week for 28 days. Apply only 1 patch at a time and alternate sites weekly. 4 patch 0   buprenorphine (BUTRANS) 10 MCG/HR PTWK Place 1 patch onto the skin once a week for 28 days. Apply only 1 patch at a time and alternate sites weekly. 4 patch 0   [START ON 02/21/2023] buprenorphine (BUTRANS) 10 MCG/HR PTWK Place 1 patch onto the skin once a week for 28 days. Apply only 1 patch at a time and alternate sites weekly. 4 patch 0   carvedilol (COREG) 12.5 MG tablet Take 1 tablet (12.5 mg total) by mouth 2 (two) times daily. 180 tablet 1   COSOPT 22.3-6.8 MG/ML ophthalmic solution Place 1 drop into both eyes 2 (two) times daily.     diclofenac Sodium (VOLTAREN) 1 % GEL Apply 2 g topically at bedtime. Apply to both knees twice daily (Patient taking differently: Apply 2 g topically at bedtime. Apply to both knees at night) 350 g 0   DULoxetine (CYMBALTA) 30 MG capsule Take 3 capsules (90 mg total) by mouth daily. 270 capsule 1   feeding supplement (ENSURE ENLIVE / ENSURE PLUS) LIQD Take 237 mLs by mouth 4 (four) times daily -  before meals and at bedtime.     finasteride (PROSCAR) 5 MG tablet Take 1 tablet (  5 mg total) by mouth daily. 90 tablet 1   gabapentin (NEURONTIN) 300 MG capsule Take 1 capsule (300 mg total) by mouth 2 (two) times daily. 180 capsule 1   Iron, Ferrous Sulfate, 325 (65 Fe) MG TABS Take 325 mg by mouth daily with supper. 30 tablet    latanoprost (XALATAN) 0.005 % ophthalmic solution Place 1 drop into both eyes at bedtime.     Melatonin 5 MG CAPS Take 1 capsule (5 mg total) by mouth at bedtime. (Patient taking differently: Take 15 mg by mouth at bedtime.)  0   OLANZapine (ZYPREXA) 2.5 MG tablet Take 1 tablet (2.5 mg total) by mouth 2 (two) times daily. 180 tablet 1   pantoprazole (PROTONIX) 40 MG tablet Take 1 tablet (40 mg total) by mouth daily before breakfast. 90 tablet 1   polyethylene  glycol (MIRALAX / GLYCOLAX) 17 g packet Take 17 g by mouth 2 (two) times daily.     tamsulosin (FLOMAX) 0.4 MG CAPS capsule Take 1 capsule (0.4 mg total) by mouth daily. 90 capsule 1   traZODone (DESYREL) 150 MG tablet Take 1 tablet (150 mg total) by mouth at bedtime as needed. 90 tablet 3   No current facility-administered medications for this visit.    Allergies  Allergen Reactions   Anthralin Shortness Of Breath   Azithromycin Shortness Of Breath    Family History  Problem Relation Age of Onset   Depression Mother    Heart disease Father     Social History   Socioeconomic History   Marital status: Divorced    Spouse name: Not on file   Number of children: Not on file   Years of education: Not on file   Highest education level: Not on file  Occupational History   Occupation: retired  Tobacco Use   Smoking status: Former    Types: Cigarettes   Smokeless tobacco: Former    Types: Chew   Tobacco comments:    stopped 15 years ago  Psychologist, educational Use   Vaping status: Never Used  Substance and Sexual Activity   Alcohol use: Not Currently    Comment: past   Drug use: Never   Sexual activity: Not Currently  Other Topics Concern   Not on file  Social History Narrative   Not on file   Social Determinants of Health   Financial Resource Strain: Low Risk  (07/13/2022)   Overall Financial Resource Strain (CARDIA)    Difficulty of Paying Living Expenses: Not hard at all  Food Insecurity: No Food Insecurity (10/11/2022)   Hunger Vital Sign    Worried About Running Out of Food in the Last Year: Never true    Ran Out of Food in the Last Year: Never true  Transportation Needs: No Transportation Needs (10/11/2022)   PRAPARE - Administrator, Civil Service (Medical): No    Lack of Transportation (Non-Medical): No  Physical Activity: Inactive (07/13/2022)   Exercise Vital Sign    Days of Exercise per Week: 0 days    Minutes of Exercise per Session: 0 min  Stress: Stress  Concern Present (07/13/2022)   [employer] of Occupational Health - Occupational Stress Questionnaire    Feeling of Stress : Rather much  Social Connections: Socially Isolated (07/13/2022)   Social Connection and Isolation Panel [NHANES]    Frequency of Communication with Friends and Family: More than three times a week    Frequency of Social Gatherings with Friends and Family: Twice a week  Attends Religious Services: Never    Active Member of Clubs or Organizations: No    Attends Banker Meetings: Never    Marital Status: Divorced  Catering manager Violence: Not At Risk (10/11/2022)   Humiliation, Afraid, Rape, and Kick questionnaire    Fear of Current or Ex-Partner: No    Emotionally Abused: No    Physically Abused: No    Sexually Abused: No     Constitutional: Denies fever, malaise, fatigue, headache or abrupt weight changes.  HEENT: Denies eye pain, eye redness, ear pain, ringing in the ears, wax buildup, runny nose, nasal congestion, bloody nose, or sore throat. Respiratory: Denies difficulty breathing, shortness of breath, cough or sputum production.   Cardiovascular: Denies chest pain, chest tightness, palpitations or swelling in the hands or feet.  Gastrointestinal: Patient reports constipation.  Denies abdominal pain, bloating, diarrhea or blood in the stool.  GU: Denies urgency, frequency, pain with urination, burning sensation, blood in urine, odor or discharge. Musculoskeletal: Patient reports generalized weakness, chronic joint pain.  Denies muscle pain or joint swelling.  Skin: Denies redness, rashes, lesions or ulcercations.  Neurological: Denies dizziness, difficulty with memory, difficulty with speech or problems with balance and coordination.  Psych: Patient has a history of anxiety and depression.  Denies SI/HI.  No other specific complaints in a complete review of systems (except as listed in HPI above).  Observations/Objective:   Wt Readings  from Last 3 Encounters:  01/11/23 208 lb (94.3 kg)  01/08/23 208 lb (94.3 kg)  12/27/22 210 lb (95.3 kg)    General: Appears his stated age, obese, chronically ill-appearing, in NAD. Pulmonary/Chest: Normal effort. No respiratory distress.  Musculoskeletal: Sitting up on the side of the bed without assistance. Neurological: Alert and oriented.  Coordination normal.     BMET    Component Value Date/Time   NA 140 01/08/2023 1138   NA 140 07/10/2018 1345   K 5.1 01/08/2023 1138   CL 105 01/08/2023 1138   CO2 27 01/08/2023 1138   GLUCOSE 112 (H) 01/08/2023 1138   BUN 21 01/08/2023 1138   BUN 25 07/10/2018 1345   CREATININE 1.11 01/08/2023 1138   CALCIUM 9.0 01/08/2023 1138   GFRNONAA >60 01/08/2023 1138   GFRAA >60 02/09/2020 0420    Lipid Panel     Component Value Date/Time   TRIG 228 (H) 03/30/2019 0324    CBC    Component Value Date/Time   WBC 3.8 (L) 01/08/2023 1138   RBC 4.08 (L) 01/08/2023 1138   HGB 11.7 (L) 01/08/2023 1138   HCT 39.3 01/08/2023 1138   PLT 155 01/08/2023 1138   MCV 96.3 01/08/2023 1138   MCH 28.7 01/08/2023 1138   MCHC 29.8 (L) 01/08/2023 1138   RDW 17.4 (H) 01/08/2023 1138   LYMPHSABS 0.9 10/18/2022 0431   MONOABS 1.3 (H) 10/18/2022 0431   EOSABS 0.1 10/18/2022 0431   BASOSABS 0.0 10/18/2022 0431    Hgb A1C Lab Results  Component Value Date   HGBA1C 5.4 12/13/2019       Assessment and Plan:  HTN:  Will start olmesartan 20 mg daily in addition to carvedilol and bumex as previously prescribed Advised him to monitor blood pressures at home, goal would be <130/80 Reinforced DASH diet Given history of CHF, monitor daily weights  Follow-up with PCP in 1 week for blood pressure check  Follow Up Instructions:    I discussed the assessment and treatment plan with the patient. The patient was provided an  opportunity to ask questions and all were answered. The patient agreed with the plan and demonstrated an understanding of the  instructions.   The patient was advised to call back or seek an in-person evaluation if the symptoms worsen or if the condition fails to improve as anticipated.    Nicki Reaper, NP

## 2023-01-26 NOTE — Telephone Encounter (Signed)
Apt 01/26/2023 at 240

## 2023-01-29 ENCOUNTER — Ambulatory Visit: Payer: Self-pay

## 2023-01-29 NOTE — Telephone Encounter (Addendum)
Called pt - spoke with Candice. She was not with pt. She states he is doing better. She is going to pick up medication now.   Chief Complaint: high BP readings - Dizziness, HA Symptoms: above Frequency: ongoing Pertinent Negatives: Patient denies  Disposition: [x] ED /[] Urgent Care (no appt availability in office) / [] Appointment(In office/virtual)/ []  [place] [facility]/ [] Home Care/ [] Refused Recommended Disposition /[] [facility]/ []  Follow-up with PCP Additional Notes: Pt had a BP of 198/101 20 minutes ago. Pt also has  HA and dizziness. Pt just took bp medication a few minutes ago. Pt had a vv with Nicki Reaper on Friday. She called in a new rx to pharmacy. Per pt, pharmacy states they have not gotten rx.  Pharmacy confirmed receipt.  Pt will retake BP in 10-15 minutes, and call back with measurement. Called pharmacy - waited on hold 4 minutes.  Will call back. Called pharmacy - Rx was received.  - Pharmacy is filling now.  Pt will call back with BP reading.      Reason for Disposition  [1] Systolic BP  >= 160 OR Diastolic >= 100 AND [2] cardiac (e.g., breathing difficulty, chest pain) or neurologic symptoms (e.g., new-onset blurred or double vision, unsteady gait)  Answer Assessment - Initial Assessment Questions 1. BLOOD PRESSURE: "What is the blood pressure?" "Did you take at least two measurements 5 minutes apart?"     198/101 - 10 minutes 2. ONSET: "When did you take your blood pressure?"     10 minutes ago  3. HOW: "How did you take your blood pressure?" (e.g., automatic home BP monitor, visiting nurse)     Home device 4. HISTORY: "Do you have a history of high blood pressure?"     yes 5. MEDICINES: "Are you taking any medicines for blood pressure?" "Have you missed any doses recently?"     10 minutes dizziness/ Head ache 6. OTHER SYMPTOMS: "Do you have any symptoms?" (e.g., blurred vision, chest pain, difficulty breathing, headache, weakness)      Headache, dizziness  Protocols used: Blood Pressure - High-A-AH

## 2023-01-29 NOTE — Telephone Encounter (Signed)
Noted, he should have follow-up with PCP this week

## 2023-01-29 NOTE — Telephone Encounter (Signed)
Medication refilled 01/26/23 for 30 days, duplicate request.  Requested Prescriptions  Pending Prescriptions Disp Refills   olmesartan (BENICAR) 20 MG tablet [Pharmacy Med Name: OLMESARTAN MEDOXOMIL 20MG  TABLETS] 90 tablet     Sig: TAKE 1 TABLET(20 MG) BY MOUTH DAILY     Cardiovascular:  Angiotensin Receptor Blockers Failed - 01/26/2023  2:59 PM      Failed - Last BP in normal range    BP Readings from Last 1 Encounters:  01/11/23 (!) 184/80         Passed - Cr in normal range and within 180 days    Creatinine, Ser  Date Value Ref Range Status  01/08/2023 1.11 0.61 - 1.24 mg/dL Final   Creatinine, Urine  Date Value Ref Range Status  12/13/2019 631 mg/dL Final    Comment:    RESULTS CONFIRMED BY MANUAL DILUTION Performed at [facility], [address] [place], [place] [address]          Passed - K in normal range and within 180 days    Potassium  Date Value Ref Range Status  01/08/2023 5.1 3.5 - 5.1 mmol/L Final         Passed - Patient is not pregnant      Passed - Valid encounter within last 6 months    Recent Outpatient Visits           3 days ago Primary hypertension   [place] [facility] [place], [place] W, NP   1 month ago Severe episode of recurrent major depressive disorder, with psychotic features [facility])   [place] [facility] [place], Netta Neat, DO   4 months ago Visual hallucinations   [place] [facility] [place], Netta Neat, DO   6 months ago Generalized anxiety disorder with panic attacks   [place] [facility] [place], Netta Neat, DO   6 months ago Generalized anxiety disorder with panic attacks   [place] [facility] [place], Netta Neat, DO       Future Appointments             In 4 days Althea Charon, Netta Neat, DO [place] [facility], [facility]   In 2 months Althea Charon, Netta Neat, DO  [facility], [facility]

## 2023-01-30 ENCOUNTER — Ambulatory Visit: Payer: Self-pay | Admitting: *Deleted

## 2023-01-30 ENCOUNTER — Other Ambulatory Visit: Payer: Self-pay | Admitting: Internal Medicine

## 2023-01-30 DIAGNOSIS — I10 Essential (primary) hypertension: Secondary | ICD-10-CM

## 2023-01-30 MED ORDER — OLMESARTAN MEDOXOMIL 40 MG PO TABS
40.0000 mg | ORAL_TABLET | Freq: Every day | ORAL | 2 refills | Status: DC
Start: 2023-01-30 — End: 2023-03-15

## 2023-01-30 NOTE — Telephone Encounter (Signed)
I will send dose increase Olmesartan from 20 to 40mg  daily. He should still consider seeking care at [facility] ED if BP remains elevated >170/100 after this dose change. He can take TWO of the 20mg  dose for now = 40mg  if he has extra. Otherwise pick up new med and replace old med.  But given symptoms and this worsening concern, if it does not improve with this treatment plan he should seek care sooner at hospital.  Saralyn Pilar, DO [facility] 01/30/2023, 11:33 AM

## 2023-01-30 NOTE — Telephone Encounter (Signed)
Left message informing patient of medication change and recommendations.

## 2023-01-30 NOTE — Telephone Encounter (Signed)
  Chief Complaint: elevated BP SOB at rest Symptoms: this am BP 203/117 and rechecked BP 175/90 HR 75. SOB at rest, headache Frequency: this am  Pertinent Negatives: Patient denies chest pain no weakness on either side of body reported . No N/T to face Disposition: [] ED /[] Urgent Care (no appt availability in office) / [] Appointment(In office/virtual)/ []  [facility] [facility]/ [] Home Care/ [x] Refused Recommended Disposition /[] [facility]/ []  Follow-up with PCP Additional Notes:   Recommended to drink full glass of water and allow time for BP meds to lower BP . Recheck BP and if continues elevated call back. Due to SOB at rest offered appt at [facility] with E. Mecum, PA and patient declined and would like PCP notified of his recommendation first. Please advise. Call patient back.      Reason for Disposition  Systolic BP  >= 160 OR Diastolic >= 100  Answer Assessment - Initial Assessment Questions 1. BLOOD PRESSURE: "What is the blood pressure?" "Did you take at least two measurements 5 minutes apart?"     203/117 rechecked for 175/90 HR 75 after taking BP medications this am  2. ONSET: "When did you take your blood pressure?"     Now  3. HOW: "How did you take your blood pressure?" (e.g., automatic home BP monitor, visiting nurse)     At home monitor 4. HISTORY: "Do you have a history of high blood pressure?"     Hx  5. MEDICINES: "Are you taking any medicines for blood pressure?" "Have you missed any doses recently?"     Yes took approx 10 minutes ago  6. OTHER SYMPTOMS: "Do you have any symptoms?" (e.g., blurred vision, chest pain, difficulty breathing, headache, weakness)     Headaches, SOB at rest. No other sx 7. PREGNANCY: "Is there any chance you are pregnant?" "When was your last menstrual period?"     na  Protocols used: Blood Pressure - High-A-AH

## 2023-01-31 NOTE — Telephone Encounter (Signed)
Dose changed 01/30/23 Requested Prescriptions  Pending Prescriptions Disp Refills   olmesartan (BENICAR) 20 MG tablet [Pharmacy Med Name: OLMESARTAN MEDOXOMIL 20MG  TABLETS] 90 tablet     Sig: TAKE 1 TABLET(20 MG) BY MOUTH DAILY     Cardiovascular:  Angiotensin Receptor Blockers Failed - 01/30/2023  8:09 AM      Failed - Last BP in normal range    BP Readings from Last 1 Encounters:  01/11/23 (!) 184/80         Passed - Cr in normal range and within 180 days    Creatinine, Ser  Date Value Ref Range Status  01/08/2023 1.11 0.61 - 1.24 mg/dL Final   Creatinine, Urine  Date Value Ref Range Status  12/13/2019 631 mg/dL Final    Comment:    RESULTS CONFIRMED BY MANUAL DILUTION Performed at [facility], [address] [place], [place] [address]          Passed - K in normal range and within 180 days    Potassium  Date Value Ref Range Status  01/08/2023 5.1 3.5 - 5.1 mmol/L Final         Passed - Patient is not pregnant      Passed - Valid encounter within last 6 months    Recent Outpatient Visits           5 days ago Primary hypertension   [place] [facility] [place], [place], NP   1 month ago Severe episode of recurrent major depressive disorder, with psychotic features [facility])   [facility], Netta Neat, DO   4 months ago Visual hallucinations   [place] [facility] [place], Netta Neat, DO   6 months ago Generalized anxiety disorder with panic attacks   [place] [name] [name], Netta Neat, DO   6 months ago Generalized anxiety disorder with panic attacks   [place] [facility] [place], Netta Neat, DO       Future Appointments             In 2 days Althea Charon, Netta Neat, DO [place] [facility], [facility]   In 2 months Althea Charon, Netta Neat, DO [place] [facility], [facility]

## 2023-02-01 ENCOUNTER — Encounter (HOSPITAL_COMMUNITY): Payer: Self-pay | Admitting: General Surgery

## 2023-02-02 ENCOUNTER — Encounter: Payer: Self-pay | Admitting: Family Medicine

## 2023-02-02 ENCOUNTER — Telehealth: Payer: Self-pay | Admitting: Family Medicine

## 2023-02-02 ENCOUNTER — Telehealth: Payer: Medicare Other | Admitting: Family Medicine

## 2023-02-02 ENCOUNTER — Ambulatory Visit: Payer: Medicare Other | Admitting: Family Medicine

## 2023-02-02 VITALS — BP 180/97 | HR 73 | Temp 96.9°F | Ht 70.0 in | Wt 214.8 lb

## 2023-02-02 DIAGNOSIS — I10 Essential (primary) hypertension: Secondary | ICD-10-CM | POA: Diagnosis not present

## 2023-02-02 MED ORDER — AMLODIPINE BESYLATE 10 MG PO TABS
10.0000 mg | ORAL_TABLET | Freq: Every day | ORAL | 1 refills | Status: DC
Start: 2023-02-02 — End: 2023-02-22

## 2023-02-02 NOTE — Progress Notes (Signed)
Subjective:    Patient ID: Aaron Gibbs, male    DOB: [birth year], 88 y.o.   MRN: [id number]  Aaron Gibbs is a 88 y.o. male presenting on 02/02/2023 for Hypertension (Bp reading at home still high)   HPI  CHRONIC HTN: Reports recently having elevated BP readings, uncertain reason why. He is not having symptoms or headache Current Meds - Olmesartan 40mg  daily, Carvedilol 12.5mg  TWICE A DAY, Bumex 1mg  daily Reports good compliance, took meds today. Tolerating well, w/o complaints. Denies CP, dyspnea, HA, edema, dizziness / lightheadedness       02/02/2023    9:55 AM 12/27/2022    2:08 PM 12/27/2022    9:59 AM  Depression screen PHQ 2/9  Decreased Interest 0 1 0  Down, Depressed, Hopeless 0 2 1  PHQ - 2 Score 0 3 1  Altered sleeping 0 1   Tired, decreased energy 0 1   Change in appetite 0 1   Feeling bad or failure about yourself  0 1   Trouble concentrating 0 1   Moving slowly or fidgety/restless 0 0   Suicidal thoughts 0 0   PHQ-9 Score 0 8   Difficult doing work/chores Not difficult at all Somewhat difficult     Social History   Tobacco Use   Smoking status: Former    Types: Cigarettes   Smokeless tobacco: Former    Types: Chew   Tobacco comments:    stopped 15 years ago  Vaping Use   Vaping status: Never Used  Substance Use Topics   Alcohol use: Not Currently    Comment: past   Drug use: Never    Review of Systems Per HPI unless specifically indicated above     Objective:    BP (!) 180/97 Comment: Home reading earlier 8/16  Pulse 73   Temp (!) 96.9 F (36.1 C) (Temporal)   Ht 5\' 10"  (1.778 m)   Wt 214 lb 12.8 oz (97.4 kg)   SpO2 97%   BMI 30.82 kg/m   Wt Readings from Last 3 Encounters:  02/02/23 214 lb 12.8 oz (97.4 kg)  01/11/23 208 lb (94.3 kg)  01/08/23 208 lb (94.3 kg)    Physical Exam Vitals and nursing note reviewed.  Constitutional:      General: He is not in acute distress.    Appearance: Normal appearance. He is  well-developed. He is obese. He is not diaphoretic.     Comments: Elderly chronically ill appearing, currently comfortable, cooperative  HENT:     Head: Normocephalic and atraumatic.  Eyes:     General:        Right eye: No discharge.        Left eye: No discharge.     Conjunctiva/sclera: Conjunctivae normal.     Comments: Poor vision  Cardiovascular:     Rate and Rhythm: Normal rate.  Pulmonary:     Effort: Pulmonary effort is normal.  Musculoskeletal:     Comments: Wheelchair bound  Skin:    General: Skin is warm and dry.     Findings: Lesion (s/p dermatology excision left forearm, still has skin sore at sight of excision) present. No erythema or rash.  Neurological:     Mental Status: He is alert and oriented to person, place, and time.  Psychiatric:        Mood and Affect: Mood normal.        Behavior: Behavior normal.        Thought Content: Thought content normal.  Comments: Well groomed, good eye contact, normal speech and thoughts      Results for orders placed or performed during the hospital encounter of 01/08/23  Basic metabolic panel per protocol  Result Value Ref Range   Sodium 140 135 - 145 mmol/L   Potassium 5.1 3.5 - 5.1 mmol/L   Chloride 105 98 - 111 mmol/L   CO2 27 22 - 32 mmol/L   Glucose, Bld 112 (H) 70 - 99 mg/dL   BUN 21 8 - 23 mg/dL   Creatinine, Ser 8.46 0.61 - 1.24 mg/dL   Calcium 9.0 8.9 - 96.2 mg/dL   GFR, Estimated >95 >28 mL/min   Anion gap 8 5 - 15  CBC per protocol  Result Value Ref Range   WBC 3.8 (L) 4.0 - 10.5 K/uL   RBC 4.08 (L) 4.22 - 5.81 MIL/uL   Hemoglobin 11.7 (L) 13.0 - 17.0 g/dL   HCT 41.3 24.4 - 01.0 %   MCV 96.3 80.0 - 100.0 fL   MCH 28.7 26.0 - 34.0 pg   MCHC 29.8 (L) 30.0 - 36.0 g/dL   RDW 27.2 (H) 53.6 - 64.4 %   Platelets 155 150 - 400 K/uL   nRBC 0.0 0.0 - 0.2 %      Assessment & Plan:   Problem List Items Addressed This Visit     Essential hypertension - Primary   Relevant Medications   amLODipine  (NORVASC) 10 MG tablet    Blood pressure still elevated today Uncertain trigger for raise in BP Asymptomatic Goal to get BP down to < 140 / 90  Previously on Amlodipine 1 yr ago  Restart new BP med today, Amlodipine 10mg  daily  Keep on the Olmesartan 40mg  daily and Carvedilol 12.5mg  twice a day Continue Fluid pill Bumex. No significant worsening edema.  For swelling of Left forearm Recommend ice packs, compression, elevation at night  Meds ordered this encounter  Medications   amLODipine (NORVASC) 10 MG tablet    Sig: Take 1 tablet (10 mg total) by mouth daily.    Dispense:  90 tablet    Refill:  1      Follow up plan: Return if symptoms worsen or fail to improve.   Saralyn Pilar, DO [facility] 02/02/2023, 10:07 AM

## 2023-02-02 NOTE — Patient Instructions (Addendum)
Thank you for coming to the office today.  Blood pressure still elevated  Goal to get BP down to < 140 / 90  Restart new BP med today, Amlodipine 10mg  daily  Keep on the Olmesartan 40mg  daily and Carvedilol 12.5mg  twice a day Continue Fluid pill Bumex  For swelling of Left forearm Recommend ice packs, compression, elevation at night  Please schedule a Follow-up Appointment to: Return if symptoms worsen or fail to improve.  If you have any other questions or concerns, please feel free to call the office or send a message through MyChart. You may also schedule an earlier appointment if necessary.  Additionally, you may be receiving a survey about your experience at our office within a few days to 1 week by e-mail or mail. We value your feedback.  Saralyn Pilar, DO [facility], [place]

## 2023-02-02 NOTE — Telephone Encounter (Signed)
Angelica Pou calling from [facility] is calling to report that the patient called to cancel due to him not feeling well. [phone]

## 2023-02-02 NOTE — Telephone Encounter (Signed)
FYI

## 2023-02-13 ENCOUNTER — Ambulatory Visit (INDEPENDENT_AMBULATORY_CARE_PROVIDER_SITE_OTHER): Payer: Medicare Other | Admitting: Vascular Surgery

## 2023-02-13 ENCOUNTER — Encounter (INDEPENDENT_AMBULATORY_CARE_PROVIDER_SITE_OTHER): Payer: Self-pay | Admitting: Vascular Surgery

## 2023-02-13 VITALS — BP 141/87 | HR 76 | Resp 18 | Ht 71.0 in | Wt 214.0 lb

## 2023-02-13 DIAGNOSIS — E782 Mixed hyperlipidemia: Secondary | ICD-10-CM

## 2023-02-13 DIAGNOSIS — I10 Essential (primary) hypertension: Secondary | ICD-10-CM | POA: Diagnosis not present

## 2023-02-13 DIAGNOSIS — I5032 Chronic diastolic (congestive) heart failure: Secondary | ICD-10-CM | POA: Diagnosis not present

## 2023-02-13 DIAGNOSIS — I7143 Infrarenal abdominal aortic aneurysm, without rupture: Secondary | ICD-10-CM

## 2023-02-13 DIAGNOSIS — S12191A Other nondisplaced fracture of second cervical vertebra, initial encounter for closed fracture: Secondary | ICD-10-CM

## 2023-02-13 DIAGNOSIS — I7133 Infrarenal abdominal aortic aneurysm, ruptured: Secondary | ICD-10-CM

## 2023-02-13 NOTE — Assessment & Plan Note (Signed)
In a soft collar.

## 2023-02-13 NOTE — Assessment & Plan Note (Signed)
blood pressure control important in reducing the progression of atherosclerotic disease and aneurysmal growth. On appropriate oral medications.  

## 2023-02-13 NOTE — Progress Notes (Signed)
Patient ID: Aaron Gibbs, male   DOB: 1935-02-24, 88 y.o.   MRN: [id number]  Chief Complaint  Patient presents with   New Patient (Initial Visit)    NP. consult. 5cm AAA on recent CT in epic.    HPI Aaron Gibbs is a 88 y.o. male.  I am asked to see the patient by Dr. Donell Beers for evaluation of an AAA.  This was an incidental finding on a CT scan of the abdomen pelvis performed approximately 4 months ago which I have independently reviewed.  This was a 5 mm cut CT scan with contrast although the protocol was clearly not a CT angiogram.  He is also essentially sideways in the machine for the pictures.  Despite this, it is clear that he does have an approximately 5.0 cm abdominal aortic aneurysm.  There is no evidence of dissection or rupture that I can tell on this limited CT scan.  Given this finding, he was referred for further evaluation and treatment.  He has a lot of spine issues but no new back or abdominal pain that is unexplained.  No signs of peripheral embolization.   Past Medical History:  Diagnosis Date   Anxiety    Arthritis    Cancer (HCC)    skin and melanoma Left forearm   CHF (congestive heart failure) (HCC)    COPD (chronic obstructive pulmonary disease) (HCC)    Depression    Dysrhythmia    Fracture of cervical vertebra, C2 (HCC)    Glaucoma    Hypertension    Osteoporosis    osteopenia   Pneumonia    Suicide attempt (HCC) (05/13/18) 06/26/2018    Past Surgical History:  Procedure Laterality Date   CHOLECYSTECTOMY     COLONOSCOPY     EXCISION MELANOMA WITH SENTINEL LYMPH NODE BIOPSY Left 01/11/2023   Procedure: CAUTERIZATION OF BLEEDING WOUND ON LEFT FOREARM;  Surgeon: [name], MD;  Location: [facility];  Service: General;  Laterality: Left;   TRANSURETHRAL RESECTION OF PROSTATE N/A 2008     Family History  Problem Relation Age of Onset   Depression Mother    Heart disease Father   No aneurysms   Social History   Tobacco Use   Smoking  status: Former    Types: Cigarettes   Smokeless tobacco: Former    Types: Chew   Tobacco comments:    stopped 15 years ago  Vaping Use   Vaping status: Never Used  Substance Use Topics   Alcohol use: Not Currently    Comment: past   Drug use: Never     Allergies  Allergen Reactions   Anthralin Shortness Of Breath   Azithromycin Shortness Of Breath    Current Outpatient Medications  Medication Sig Dispense Refill   acetaminophen (TYLENOL) 500 MG tablet Take 1,000 mg by mouth in the morning, at noon, and at bedtime.     albuterol (VENTOLIN HFA) 108 (90 Base) MCG/ACT inhaler Inhale 2 puffs into the lungs every 4 (four) hours as needed for wheezing or shortness of breath.     amLODipine (NORVASC) 10 MG tablet Take 1 tablet (10 mg total) by mouth daily. 90 tablet 1   bumetanide (BUMEX) 0.5 MG tablet Take 1 tablet (0.5 mg total) by mouth 2 (two) times daily. (Patient taking differently: Take 1 mg by mouth 2 (two) times daily.) 180 tablet 1   buprenorphine (BUTRANS) 10 MCG/HR PTWK Place 1 patch onto the skin once a week for 28 days. Apply only  1 patch at a time and alternate sites weekly. 4 patch 0   buprenorphine (BUTRANS) 10 MCG/HR PTWK Place 1 patch onto the skin once a week for 28 days. Apply only 1 patch at a time and alternate sites weekly. 4 patch 0   [START ON 02/21/2023] buprenorphine (BUTRANS) 10 MCG/HR PTWK Place 1 patch onto the skin once a week for 28 days. Apply only 1 patch at a time and alternate sites weekly. 4 patch 0   carvedilol (COREG) 12.5 MG tablet Take 1 tablet (12.5 mg total) by mouth 2 (two) times daily. 180 tablet 1   COSOPT 22.3-6.8 MG/ML ophthalmic solution Place 1 drop into both eyes 2 (two) times daily.     diclofenac Sodium (VOLTAREN) 1 % GEL Apply 2 g topically at bedtime. Apply to both knees twice daily (Patient taking differently: Apply 2 g topically at bedtime. Apply to both knees at night) 350 g 0   DULoxetine (CYMBALTA) 30 MG capsule Take 3 capsules (90  mg total) by mouth daily. 270 capsule 1   feeding supplement (ENSURE ENLIVE / ENSURE PLUS) LIQD Take 237 mLs by mouth 4 (four) times daily -  before meals and at bedtime.     finasteride (PROSCAR) 5 MG tablet Take 1 tablet (5 mg total) by mouth daily. 90 tablet 1   gabapentin (NEURONTIN) 300 MG capsule Take 1 capsule (300 mg total) by mouth 2 (two) times daily. 180 capsule 1   Iron, Ferrous Sulfate, 325 (65 Fe) MG TABS Take 325 mg by mouth daily with supper. 30 tablet    latanoprost (XALATAN) 0.005 % ophthalmic solution Place 1 drop into both eyes at bedtime.     Melatonin 5 MG CAPS Take 1 capsule (5 mg total) by mouth at bedtime. (Patient taking differently: Take 15 mg by mouth at bedtime.)  0   OLANZapine (ZYPREXA) 2.5 MG tablet Take 1 tablet (2.5 mg total) by mouth 2 (two) times daily. 180 tablet 1   olmesartan (BENICAR) 40 MG tablet Take 1 tablet (40 mg total) by mouth daily. 30 tablet 2   pantoprazole (PROTONIX) 40 MG tablet Take 1 tablet (40 mg total) by mouth daily before breakfast. 90 tablet 1   polyethylene glycol (MIRALAX / GLYCOLAX) 17 g packet Take 17 g by mouth 2 (two) times daily.     tamsulosin (FLOMAX) 0.4 MG CAPS capsule Take 1 capsule (0.4 mg total) by mouth daily. 90 capsule 1   traZODone (DESYREL) 150 MG tablet Take 1 tablet (150 mg total) by mouth at bedtime as needed. 90 tablet 3   No current facility-administered medications for this visit.      REVIEW OF SYSTEMS (Negative unless checked)  Constitutional: [] Weight loss  [] Fever  [] Chills Cardiac: [] Chest pain   [] Chest pressure   [x] Palpitations   [] Shortness of breath when laying flat   [x] Shortness of breath at rest   [x] Shortness of breath with exertion. Vascular:  [] Pain in legs with walking   [] Pain in legs at rest   [] Pain in legs when laying flat   [] Claudication   [] Pain in feet when walking  [] Pain in feet at rest  [] Pain in feet when laying flat   [] History of DVT   [] Phlebitis   [x] Swelling in legs    [] Varicose veins   [] Non-healing ulcers Pulmonary:   [] Uses home oxygen   [] Productive cough   [] Hemoptysis   [] Wheeze  [] COPD   [] Asthma Neurologic:  [x] Dizziness  [] Blackouts   [] Seizures   [] History  of stroke   [] History of TIA  [] Aphasia   [] Temporary blindness   [] Dysphagia   [] Weakness or numbness in arms   [] Weakness or numbness in legs Musculoskeletal:  [x] Arthritis   [] Joint swelling   [x] Joint pain   [x] Low back pain Hematologic:  [x] Easy bruising  [] Easy bleeding   [] Hypercoagulable state   [] Anemic  [] Hepatitis Gastrointestinal:  [] Blood in stool   [] Vomiting blood  [] Gastroesophageal reflux/heartburn   [] Abdominal pain Genitourinary:  [x] Chronic kidney disease   [] Difficult urination  [] Frequent urination  [] Burning with urination   [] Hematuria Skin:  [] Rashes   [] Ulcers   [] Wounds Psychological:  [] History of anxiety   []  History of major depression.    Physical Exam BP (!) 141/87 (BP Location: Right Leg)   Pulse 76   Resp 18   Ht 5\' 11"  (1.803 m)   Wt 214 lb (97.1 kg)   BMI 29.85 kg/m  Gen:  WD/WN, elderly male in NAD Head: [place]/AT, No temporalis wasting.  Ear/Nose/Throat: Hearing grossly intact, nares w/o erythema or drainage, oropharynx w/o Erythema/Exudate Eyes: Conjunctiva clear, sclera non-icteric. Patient blind. Neck: trachea midline.  No JVD.  Pulmonary:  Good air movement, respirations not labored, no use of accessory muscles  Cardiac: irregular Vascular:  Vessel Right Left  Radial Palpable Palpable                                   Gastrointestinal:. Aortic impulse not easily palpable due to body habitus.  No tenderness. Musculoskeletal: M/S 5/5 throughout.  Extremities without ischemic changes.  No deformity or atrophy.  In a wheelchair.  Mild bilateral lower extremity edema. Neurologic: Sensation grossly intact in extremities.  Symmetrical.  Speech is fluent. Motor exam as listed above. Psychiatric: Judgment intact, Mood & affect appropriate for pt's  clinical situation. Dermatologic: No rashes or ulcers noted.  No cellulitis or open wounds.    Radiology IR PICC PLACEMENT RIGHT >5 YRS INC IMG GUIDE  Result Date: 01/19/2023 INDICATION: Patient with recently diagnosed melanoma in need for PICC for durable venous access. EXAM: RIGHT UPPER EXTREMITY PICC LINE PLACEMENT WITH ULTRASOUND AND FLUOROSCOPIC GUIDANCE MEDICATIONS: 3 mL 1% lidocaine. ANESTHESIA/SEDATION: None. FLUOROSCOPY TIME:  Fluoroscopy Time: 14.00 mGy COMPLICATIONS: None immediate. PROCEDURE: The patient was advised of the possible risks and complications and agreed to undergo the procedure. The patient was then brought to the angiographic suite for the procedure. The right arm was prepped with chlorhexidine, draped in the usual sterile fashion using maximum barrier technique (cap and mask, sterile gown, sterile gloves, large sterile sheet, hand hygiene and cutaneous antisepsis) and infiltrated locally with 1% Lidocaine. Ultrasound demonstrated patency of the right right brachial vein, and this was documented with an image. Under real-time ultrasound guidance, this vein was accessed with a 21 gauge micropuncture needle and image documentation was performed. A 0.018 wire was introduced in to the vein. Over this, a 5 [place] single lumen power-injectable PICC was advanced to the lower SVC/right atrial junction. Fluoroscopy during the procedure and fluoro spot radiograph confirms appropriate catheter position. The catheter was aspirated, flushed, heparin locked, capped and covered with a sterile dressing. Catheter length: 42 cm IMPRESSION: Successful right arm Power PICC line placement with ultrasound and fluoroscopic guidance. The catheter is ready for use. Performed by Lynnette Caffey, PA-C Electronically Signed   By: Malachy Moan M.D.   On: 01/19/2023 10:18    Labs Recent Results (from the past 2160  hour(s))  Glucose, capillary     Status: Abnormal   Collection Time: 12/28/22  8:15 AM   Result Value Ref Range   Glucose-Capillary 109 (H) 70 - 99 mg/dL    Comment: Glucose reference range applies only to samples taken after fasting for at least 8 hours.  Basic metabolic panel per protocol     Status: Abnormal   Collection Time: 01/08/23 11:38 AM  Result Value Ref Range   Sodium 140 135 - 145 mmol/L   Potassium 5.1 3.5 - 5.1 mmol/L   Chloride 105 98 - 111 mmol/L   CO2 27 22 - 32 mmol/L   Glucose, Bld 112 (H) 70 - 99 mg/dL    Comment: Glucose reference range applies only to samples taken after fasting for at least 8 hours.   BUN 21 8 - 23 mg/dL   Creatinine, Ser 8.31 0.61 - 1.24 mg/dL   Calcium 9.0 8.9 - 51.7 mg/dL   GFR, Estimated >61 >60 mL/min    Comment: (NOTE) Calculated using the CKD-EPI Creatinine Equation (2021)    Anion gap 8 5 - 15    Comment: Performed at [facility], [address] [place], [place] [address]  CBC per protocol     Status: Abnormal   Collection Time: 01/08/23 11:38 AM  Result Value Ref Range   WBC 3.8 (L) 4.0 - 10.5 K/uL   RBC 4.08 (L) 4.22 - 5.81 MIL/uL   Hemoglobin 11.7 (L) 13.0 - 17.0 g/dL   HCT 62.6 94.8 - 54.6 %   MCV 96.3 80.0 - 100.0 fL   MCH 28.7 26.0 - 34.0 pg   MCHC 29.8 (L) 30.0 - 36.0 g/dL   RDW 27.0 (H) 35.0 - 09.3 %   Platelets 155 150 - 400 K/uL   nRBC 0.0 0.0 - 0.2 %    Comment: Performed at [facility], [address] [place], [place] [address]    Assessment/Plan:  Essential hypertension blood pressure control important in reducing the progression of atherosclerotic disease and aneurysmal growth. On appropriate oral medications.   Chronic heart failure with preserved ejection fraction (HCC) Will plan for cardiac clearance prior to surgery and we will try to arrange this.  C2 cervical fracture (HCC) In a soft collar.  Infrarenal abdominal aortic aneurysm (AAA) without rupture (HCC) The patient has a 5.0 cm infrarenal abdominal aortic aneurysm seen on 1 a limited CT  scan several months ago.  I had a long discussion today with he and his caregiver regarding that this is generally the size where aneurysm should be repaired, and he appears to have reasonable anatomy for a stent graft repair.  All that being said, I would like to get a CT angiogram of the abdomen pelvis for a more detailed study before planning is repaired definitively.  I have discussed the pathophysiology and natural history of abdominal aortic aneurysm.  I discussed why we repair aneurysms and the differences between open and endovascular repair.  We will also need to obtain preoperative testing and likely cardiac clearance prior to surgery and so we will get the ball rolling on that as well.  We will plan on endovascular repair of his aneurysm once we have obtained his new CT scan and his cardiac clearance.      Festus Barren 02/13/2023, 3:01 PM   This note was created with Dragon medical transcription system.  Any errors from dictation are unintentional.

## 2023-02-13 NOTE — Assessment & Plan Note (Signed)
Will plan for cardiac clearance prior to surgery and we will try to arrange this.

## 2023-02-13 NOTE — Assessment & Plan Note (Signed)
The patient has a 5.0 cm infrarenal abdominal aortic aneurysm seen on 1 a limited CT scan several months ago.  I had a long discussion today with he and his caregiver regarding that this is generally the size where aneurysm should be repaired, and he appears to have reasonable anatomy for a stent graft repair.  All that being said, I would like to get a CT angiogram of the abdomen pelvis for a more detailed study before planning is repaired definitively.  I have discussed the pathophysiology and natural history of abdominal aortic aneurysm.  I discussed why we repair aneurysms and the differences between open and endovascular repair.  We will also need to obtain preoperative testing and likely cardiac clearance prior to surgery and so we will get the ball rolling on that as well.  We will plan on endovascular repair of his aneurysm once we have obtained his new CT scan and his cardiac clearance.

## 2023-02-14 ENCOUNTER — Other Ambulatory Visit: Payer: Self-pay

## 2023-02-14 ENCOUNTER — Telehealth: Payer: Self-pay

## 2023-02-14 ENCOUNTER — Encounter (HOSPITAL_COMMUNITY): Payer: Self-pay | Admitting: General Surgery

## 2023-02-14 NOTE — Anesthesia Preprocedure Evaluation (Signed)
Anesthesia Evaluation  Patient identified by MRN, date of birth, ID band Patient awake    Reviewed: Allergy & Precautions, H&P , NPO status , Patient's Chart, lab work & pertinent test results, reviewed documented beta blocker date and time   Airway Mallampati: III  TM Distance: >3 FB Neck ROM: Full    Dental  (+) Dental Advisory Given, Edentulous Upper, Edentulous Lower   Pulmonary COPD,  COPD inhaler, former smoker   Pulmonary exam normal breath sounds clear to auscultation       Cardiovascular hypertension (148/73 preop), Pt. on medications and Pt. on home beta blockers + Peripheral Vascular Disease and +CHF   Rhythm:Regular Rate:Normal     Neuro/Psych  PSYCHIATRIC DISORDERS Anxiety Depression    Chronic C2 fracture     GI/Hepatic Neg liver ROS,GERD  Medicated and Controlled,,  Endo/Other  negative endocrine ROS    Renal/GU negative Renal ROS  negative genitourinary   Musculoskeletal  (+) Arthritis , Osteoarthritis,    Abdominal   Peds negative pediatric ROS (+)  Hematology  (+) Blood dyscrasia, anemia Hb 9.8   Anesthesia Other Findings Metastatic Melanoma  Reproductive/Obstetrics negative OB ROS                             Anesthesia Physical Anesthesia Plan  ASA: 4  Anesthesia Plan: General   Post-op Pain Management:    Induction: Intravenous  PONV Risk Score and Plan: 2 and Ondansetron, Midazolam and Treatment may vary due to age or medical condition  Airway Management Planned: Oral ETT  Additional Equipment: None  Intra-op Plan:   Post-operative Plan: Extubation in OR  Informed Consent: I have reviewed the patients History and Physical, chart, labs and discussed the procedure including the risks, benefits and alternatives for the proposed anesthesia with the patient or authorized representative who has indicated his/her understanding and acceptance.     Dental  advisory given  Plan Discussed with: CRNA  Anesthesia Plan Comments: (See recent PAT note by Terance Hart, PA-C 01/08/23. Surgery was attempted 01/11/23 but not able to proceed due to poor venous access. Pt subsequently had PICC line placed.  Difficult to position, neck brace in place  )       Anesthesia Quick Evaluation

## 2023-02-14 NOTE — Telephone Encounter (Signed)
Diagnosis list printed.  Aaron Gibbs aware she can come pick up.

## 2023-02-14 NOTE — Telephone Encounter (Signed)
Copied from CRM [id number]. Topic: General - Other >> Feb 12, 2023  4:46 PM Epimenio Foot F wrote: Reason for CRM: Rease Rowlison is calling in because pt is applying for benefits for the [facility] and needs a printout of the pt's current diagnoses. She says she is able to pick the printout up. Please follow up with [name].

## 2023-02-14 NOTE — Progress Notes (Addendum)
SDW CALL  Patient gave permission over the phone for me to speak with his caregiver,Candice Brooke Dare and for her to receive instructions for his surgery tomorrow. Also he gave verbal permission for her to review his history as he relayed information to her.  The opportunity was given for the patient to ask questions. No further questions asked. Patient verbalized understanding of instructions given.   PCP -Manus Rudd  Cardiologist - denies  PPM/ICD - denies Device Orders -  Rep Notified -   Chest x-ray - 01/11/23 EKG - 10/17/22 Stress Test - none ECHO - 02/14/19 Cardiac Cath - none  Sleep Study - none CPAP - no  Fasting Blood Sugar - na Checks Blood Sugar _____ times a day  Blood Thinner Instructions:na Aspirin Instructions:na  ERAS Protcol - clear liquids until 0600 PRE-SURGERY Ensure or G2-   COVID TEST- na   Anesthesia review: yes-history CHF,COPD. EKG 10/17/22 showed afib but pt doesn't see a cardiologist.   Patient denies shortness of breath, fever, cough and chest pain over the phone call    Surgical Instructions    Your procedure is scheduled on August 29  Report to [facility] Main Entrance "A" at 0630 A.M., then check in with the Admitting office.  Call this number if you have problems the morning of surgery:  [phone]    Remember:  Do not eat after midnight the night before your surgery  You may drink clear liquids until 0600 the morning of your surgery.   Clear liquids allowed are: Water, Non-Citrus Juices (without pulp), Carbonated Beverages, Clear Tea, Black Coffee ONLY (NO MILK, CREAM OR POWDERED CREAMER of any kind), and Gatorade   Take these medicines the morning of surgery with A SIP OF WATER: Norvasc,Coreg,Cosopt eye gtts,Cymbalta,Proscar,Gabapentin,Zyprexa,Protonix, Flomax. PRN- Tylenol.    As of today, STOP taking any Aspirin (unless otherwise instructed by your surgeon) Aleve, Naproxen, Ibuprofen, Motrin, Advil, Goody's, BC's, all  herbal medications, fish oil, and all vitamins. No Diclofenac(voltaren) gel- pt's caregiver states pt does not use this anymore.   [facility] is not responsible for any belongings or valuables. .   Do NOT Smoke (Tobacco/Vaping)  24 hours prior to your procedure  If you use a CPAP at night, you may bring your mask for your overnight stay.   Contacts, glasses, hearing aids, dentures or partials may not be worn into surgery, please bring cases for these belongings   Patients discharged the day of surgery will not be allowed to drive home, and someone needs to stay with them for 24 hours.    Please refer to the [facility] website for the visitor guidelines for Inpatients (after your surgery is over and you are in a regular room).     Special instructions:    Oral Hygiene is also important to reduce your risk of infection.  Remember - BRUSH YOUR TEETH THE MORNING OF SURGERY WITH YOUR REGULAR TOOTHPASTE   Day of Surgery:  Take a shower the day of or night before with antibacterial soap. Wear Clean/Comfortable clothing the morning of surgery Do not apply any deodorants/lotions.   Do not wear jewelry or makeup Do not wear lotions, powders, perfumes/colognes, or deodorant. Do not shave 48 hours prior to surgery.  Men may shave face and neck. Do not bring valuables to the hospital. Do not wear nail polish, gel polish, artificial nails, or any other type of covering on natural nails (fingers and toes) If you have artificial nails or gel coating that need to be  removed by a nail salon, please have this removed prior to surgery. Artificial nails or gel coating may interfere with anesthesia's ability to adequately monitor your vital signs. Remember to brush your teeth WITH YOUR REGULAR TOOTHPASTE.

## 2023-02-14 NOTE — H&P (Signed)
REFERRING PHYSICIAN: Self PROVIDER: Reine Just, PA MRN: [id number] DOB: 1934/07/17 Subjective   Chief Complaint: RE-CHECK (wound check, L forearm melanoma - c/o swelling, bleeding/drainage and redness x 1-2 weeks.)  History of Present Illness: Aaron Gibbs is a 88 y.o. male who is seen today as an office consultation for evaluation of RE-CHECK (wound check, L forearm melanoma - c/o swelling, bleeding/drainage and redness x 1-2 weeks.)  Patient presented July 2024 with a visible and palpable nodule in the left forearm. He is a resident at a skilled nursing facility. He was sent to dermatology with this clinically apparent lesion. There are 3 spots on his left forearm and one of them was biopsied at [facility] dermatology. This showed a metastatic melanoma. This was demonstrated to be an in-transit dermal deposit. The one that was biopsied was 3 mm in greatest dimension and had multiple positive margins. A Breslow depth was not given as it was a in-transit metastasis. He had 2 other lesions nearby that are present but are not biopsied at this point. These are presumed the same pathology.  The patient has a history of melanoma on the right forearm but he thinks that this is around 20+ years ago. He does not know of any other skin lesions on his arm. At the same time they did biopsy something on his upper abdomen that was melanoma in situ.  Of note, the patient is in a soft collar with a mildly displaced type II dens fracture that is unchanged on recent imaging from June 1. The patient is also in a wheelchair, but can do transfers himself. He states that he is legally blind.  Interval history: He was scheduled for surgery on 01/11/2023 but his IV was infiltrated.  Due to the patient's inability to lay supine, the femorals vessels and the right subclavian could not be accessed.  We could not access his jugulars due to the cervical collar.  The left subclavian vessel could not be safely  accessed to get a line. There was hemorrhaging of the left forearm wound so I infiltrated local anesthetic and cauterized the area to stop the bleeding. PICC line was placed by IR in the intervening time.    Review of Systems: A complete review of systems was obtained from the patient. I have reviewed this information and discussed as appropriate with the patient. See HPI as well for other ROS.  Review of Systems  Musculoskeletal: Positive for neck pain.  Neurological: Positive for weakness.    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 (CMS/HHS-HCC)  Chronic heart failure with preserved ejection fraction (CMS/HHS-HCC)  C2 cervical fracture (CMS/HHS-HCC)  COPD (chronic obstructive pulmonary disease) (CMS/HHS-HCC)  COVID-19  Infrarenal abdominal aortic aneurysm (AAA) without rupture (CMS-HCC)  Melanoma in situ of trunk (CMS/HHS-HCC)  Decreased functional activity tolerance  Lymphedema of arm  Abnormal MRI, knee  Acute meniscal tear, lateral, left, sequela  Acute meniscal tear, medial, left, sequela  AKI (acute kidney injury) (CMS-HCC)  Ankylosing spondylitis lumbar region (CMS/HHS-HCC)  Aortic atherosclerosis (CMS-HCC)  Atherosclerotic peripheral vascular disease (CMS-HCC)  AVM (arteriovenous malformation) of colon  Benign paroxysmal positional vertigo  Bilateral primary osteoarthritis of knee  Blindness  Chronic bilateral low back pain without sciatica  Depression with anxiety  DISH (diffuse idiopathic skeletal hyperostosis)  Disorder of skeletal system  DNR (do not resuscitate)  Essential hypertension   Fall at home, initial encounter  GERD (gastroesophageal reflux disease)  Glaucoma of both eyes  History of substance use disorder  History of suicide attempt  HLD (hyperlipidemia)  Hypokalemia  Iron deficiency anemia  Long term (current) use of opiate analgesic  Long-term current use of benzodiazepine  Major depressive disorder, recurrent episode, severe (CMS/HHS-HCC)  Cervicalgia  Noncompliance with medication treatment due to overuse of medication  Opioid dependence, binge pattern (CMS/HHS-HCC)  Osteoarthritis of multiple joints  Osteoarthritis of right patellofemoral joint  Pain in left knee  Pain in right knee  Palpitation  Palliative care patient  Problems influencing health status  Retinitis pigmentosa of both eyes  Sensorineural hearing loss (SNHL) of both ears  Somatic symptom disorder  Suicidal ideation  Therapeutic opioid-induced constipation (OIC)  Thrombocytopenia (CMS-HCC)  Unable to maintain body in lying position  Unilateral primary osteoarthritis, left knee  Unspecified abdominal pain  Weakness   Past Surgical History:  Procedure Laterality Date  PROSTATE SURGERY 2008  EXCISION MELANOMA Left 01/11/2023  FOREARM  CHOLECYSTECTOMY  COLONOSCOPY    Allergies  Allergen Reactions  Anthralin Shortness Of Breath  Azithromycin Shortness Of Breath   Current Outpatient Medications on File Prior to Visit  Medication Sig Dispense Refill  buprenorphine (BUTRANS) 10 mcg/hour Place onto the skin  carvediloL (COREG) 12.5 MG tablet Take 12.5 mg by mouth 2 (two) times daily  DULoxetine (CYMBALTA) 30 MG DR capsule Take by mouth  ferrous sulfate 325 (65 FE) MG tablet Take by mouth  OLANZapine (ZYPREXA) 2.5 MG tablet Take 2.5 mg by mouth 2 (two) times daily  pantoprazole (PROTONIX) 40 MG DR tablet Take by mouth  traZODone (DESYREL) 150 MG tablet Take by mouth   No current facility-administered medications on file prior to visit.   Family History  Problem Relation  Age of Onset  Depression Mother  Coronary Artery Disease (Blocked arteries around heart) Father    Social History   Tobacco Use  Smoking Status Former  Types: Cigarettes  Smokeless Tobacco Never    Social History   Socioeconomic History  Marital status: Divorced  Tobacco Use  Smoking status: Former  Types: Cigarettes  Smokeless tobacco: Never  Substance and Sexual Activity  Alcohol use: Yes  Drug use: Never   Social Determinants of Health   Financial Resource Strain: Low Risk (07/13/2022)  Received from [facility]  Overall Financial Resource Strain (CARDIA)  Difficulty of Paying Living Expenses: Not hard at all  Food Insecurity: No Food Insecurity (10/11/2022)  Received from [facility]  Hunger Vital Sign  Worried About Running Out of Food in the Last Year: Never true  Ran Out of Food in the Last Year: Never true  Transportation Needs: No Transportation Needs (10/11/2022)  Received from [facility] - Transportation  Lack of Transportation (Medical): No  Lack of Transportation (Non-Medical): No  Physical Activity: Inactive (07/13/2022)  Received from [facility]  Exercise Vital Sign  Days of Exercise per Week: 0 days  Minutes of Exercise per Session: 0 min  Stress: Stress Concern Present (07/13/2022)  Received from [facility] - Occupational Stress Questionnaire  Feeling of Stress : Rather much  Social Connections: Socially Isolated (07/13/2022)  Received from [facility]  Social Connection and Isolation Panel [NHANES]  Frequency of Communication  with Friends and Family: More than three times a week  Frequency of Social Gatherings with Friends and Family: Twice a week  Attends Religious Services: Never  Database administrator or Organizations: No  Attends Engineer, structural: Never  Marital Status: Divorced   Objective:   Vitals:  01/25/23 1531  BP: (!) 181/74  Pulse: 67  Temp: 36.6 C (97.9 F)   SpO2: (!) 90%  Weight: 98.3 kg (216 lb 12.8 oz)  Height: 177.8 cm (5\' 10" )  PainSc: 5  PainLoc: Arm    Body mass index is 31.11 kg/m.  Head: Normocephalic and atraumatic. Very frail looking. In soft cervical collar  Musculoskeletal: On the left forearm, there is a large vascular mass that tracks to the inner aspect of his forearm. There is surrounding erythema and swelling. There is edema of his hand. Sensation of hand is intact  Neurological: Alert and oriented to person, place, and time. Coordination normal.  Skin: Skin is warm and dry. No rash noted. No diaphoresis. No erythema. No pallor.  Psychiatric: Normal mood and affect.Behavior is normal. Judgment and thought content normal.   Labs, Imaging and Diagnostic Testing: Labs 10/23/22 with CBC showing HCT 31.5 and WBCs 3.9k BMET with Cl 112 and gluc 119.   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 This is accompanied by lymphedema of the left arm.   He got cellulitis and lymphedema.  He was treated with antibiotics.

## 2023-02-15 ENCOUNTER — Ambulatory Visit (HOSPITAL_BASED_OUTPATIENT_CLINIC_OR_DEPARTMENT_OTHER): Payer: Medicare Other | Admitting: Vascular Surgery

## 2023-02-15 ENCOUNTER — Other Ambulatory Visit: Payer: Self-pay

## 2023-02-15 ENCOUNTER — Encounter (HOSPITAL_COMMUNITY): Payer: Self-pay | Admitting: General Surgery

## 2023-02-15 ENCOUNTER — Encounter (HOSPITAL_COMMUNITY)
Admission: RE | Disposition: A | Discharge status: Home / Self Care | Payer: Self-pay | Source: Home / Self Care | Attending: General Surgery

## 2023-02-15 ENCOUNTER — Ambulatory Visit (HOSPITAL_COMMUNITY)
Admission: RE | Admit: 2023-02-15 | Discharge: 2023-02-15 | Disposition: A | Discharge status: Home / Self Care | Payer: Medicare Other | Attending: General Surgery | Admitting: General Surgery

## 2023-02-15 ENCOUNTER — Ambulatory Visit (HOSPITAL_COMMUNITY): Payer: Medicare Other | Admitting: Vascular Surgery

## 2023-02-15 DIAGNOSIS — I110 Hypertensive heart disease with heart failure: Secondary | ICD-10-CM

## 2023-02-15 DIAGNOSIS — C792 Secondary malignant neoplasm of skin: Secondary | ICD-10-CM | POA: Insufficient documentation

## 2023-02-15 DIAGNOSIS — F419 Anxiety disorder, unspecified: Secondary | ICD-10-CM | POA: Diagnosis not present

## 2023-02-15 DIAGNOSIS — Z993 Dependence on wheelchair: Secondary | ICD-10-CM | POA: Insufficient documentation

## 2023-02-15 DIAGNOSIS — L0390 Cellulitis, unspecified: Secondary | ICD-10-CM | POA: Diagnosis not present

## 2023-02-15 DIAGNOSIS — I5032 Chronic diastolic (congestive) heart failure: Secondary | ICD-10-CM | POA: Diagnosis not present

## 2023-02-15 DIAGNOSIS — K219 Gastro-esophageal reflux disease without esophagitis: Secondary | ICD-10-CM | POA: Diagnosis not present

## 2023-02-15 DIAGNOSIS — F32A Depression, unspecified: Secondary | ICD-10-CM | POA: Insufficient documentation

## 2023-02-15 DIAGNOSIS — H548 Legal blindness, as defined in USA: Secondary | ICD-10-CM | POA: Insufficient documentation

## 2023-02-15 DIAGNOSIS — J449 Chronic obstructive pulmonary disease, unspecified: Secondary | ICD-10-CM | POA: Diagnosis not present

## 2023-02-15 DIAGNOSIS — C4362 Malignant melanoma of left upper limb, including shoulder: Secondary | ICD-10-CM

## 2023-02-15 DIAGNOSIS — Z87891 Personal history of nicotine dependence: Secondary | ICD-10-CM

## 2023-02-15 DIAGNOSIS — Z79899 Other long term (current) drug therapy: Secondary | ICD-10-CM | POA: Insufficient documentation

## 2023-02-15 DIAGNOSIS — I890 Lymphedema, not elsewhere classified: Secondary | ICD-10-CM | POA: Insufficient documentation

## 2023-02-15 DIAGNOSIS — D649 Anemia, unspecified: Secondary | ICD-10-CM | POA: Insufficient documentation

## 2023-02-15 HISTORY — PX: EXCISION MELANOMA WITH SENTINEL LYMPH NODE BIOPSY: SHX5628

## 2023-02-15 LAB — BASIC METABOLIC PANEL
Anion gap: 6 (ref 5–15)
BUN: 20 mg/dL (ref 8–23)
CO2: 27 mmol/L (ref 22–32)
Calcium: 8.7 mg/dL — ABNORMAL LOW (ref 8.9–10.3)
Chloride: 105 mmol/L (ref 98–111)
Creatinine, Ser: 1.07 mg/dL (ref 0.61–1.24)
GFR, Estimated: >60 mL/min (ref >60)
Glucose, Bld: 121 mg/dL — ABNORMAL HIGH (ref 70–99)
Potassium: 4.1 mmol/L (ref 3.5–5.1)
Sodium: 138 mmol/L (ref 135–145)

## 2023-02-15 LAB — CBC
HCT: 31.4 % — ABNORMAL LOW (ref 39.0–52.0)
Hemoglobin: 9.8 g/dL — ABNORMAL LOW (ref 13.0–17.0)
MCH: 29.8 pg (ref 26.0–34.0)
MCHC: 31.2 g/dL (ref 30.0–36.0)
MCV: 95.4 fL (ref 80.0–100.0)
Platelets: 153 10*3/uL (ref 150–400)
RBC: 3.29 MIL/uL — ABNORMAL LOW (ref 4.22–5.81)
RDW: 15.9 % — ABNORMAL HIGH (ref 11.5–15.5)
WBC: 4.1 10*3/uL (ref 4.0–10.5)
nRBC: 0 % (ref 0.0–0.2)

## 2023-02-15 SURGERY — EXCISION, MELANOMA, WITH SENTINEL LYMPH NODE BIOPSY
Anesthesia: General

## 2023-02-15 MED ORDER — 0.9 % SODIUM CHLORIDE (POUR BTL) OPTIME
TOPICAL | Status: DC | PRN
  Administered 2023-02-15: 1000 mL

## 2023-02-15 MED ORDER — SUGAMMADEX SODIUM 200 MG/2ML IV SOLN
INTRAVENOUS | Status: DC | PRN
  Administered 2023-02-15: 200 mg via INTRAVENOUS

## 2023-02-15 MED ORDER — ONDANSETRON HCL 4 MG/2ML IJ SOLN
INTRAMUSCULAR | Status: AC
  Filled 2023-02-15: qty 2

## 2023-02-15 MED ORDER — CEFAZOLIN SODIUM-DEXTROSE 2-4 GM/100ML-% IV SOLN
2.0000 g | INTRAVENOUS | Status: AC
  Administered 2023-02-15: 2 g via INTRAVENOUS
  Filled 2023-02-15: qty 100

## 2023-02-15 MED ORDER — HYDROMORPHONE HCL 1 MG/ML IJ SOLN
0.2500 mg | INTRAMUSCULAR | Status: DC | PRN
  Administered 2023-02-15: 0.25 mg via INTRAVENOUS

## 2023-02-15 MED ORDER — HEPARIN SOD (PORK) LOCK FLUSH 100 UNIT/ML IV SOLN
250.0000 [IU] | INTRAVENOUS | Status: AC | PRN
  Administered 2023-02-15: 250 [IU]

## 2023-02-15 MED ORDER — DEXAMETHASONE SODIUM PHOSPHATE 10 MG/ML IJ SOLN
INTRAMUSCULAR | Status: AC
  Filled 2023-02-15: qty 1

## 2023-02-15 MED ORDER — ACETAMINOPHEN 500 MG PO TABS: 1000.0000 mg | ORAL_TABLET | ORAL | Status: DC

## 2023-02-15 MED ORDER — PHENYLEPHRINE 80 MCG/ML (10ML) SYRINGE FOR IV PUSH (FOR BLOOD PRESSURE SUPPORT)
PREFILLED_SYRINGE | INTRAVENOUS | Status: DC | PRN
  Administered 2023-02-15: 80 ug via INTRAVENOUS
  Administered 2023-02-15: 160 ug via INTRAVENOUS
  Administered 2023-02-15: 80 ug via INTRAVENOUS

## 2023-02-15 MED ORDER — ROCURONIUM BROMIDE 10 MG/ML (PF) SYRINGE
PREFILLED_SYRINGE | INTRAVENOUS | Status: AC
  Filled 2023-02-15: qty 10

## 2023-02-15 MED ORDER — ONDANSETRON HCL 4 MG/2ML IJ SOLN
INTRAMUSCULAR | Status: DC | PRN
Start: 2023-02-15 — End: 2023-02-15
  Administered 2023-02-15: 4 mg via INTRAVENOUS

## 2023-02-15 MED ORDER — LACTATED RINGERS IV SOLN: INTRAVENOUS | Status: DC

## 2023-02-15 MED ORDER — ALBUMIN HUMAN 5 % IV SOLN: INTRAVENOUS | Status: DC | PRN

## 2023-02-15 MED ORDER — ORAL CARE MOUTH RINSE: 15.0000 mL | Freq: Once | OROMUCOSAL | Status: AC

## 2023-02-15 MED ORDER — CHLORHEXIDINE GLUCONATE CLOTH 2 % EX PADS: 6.0000 | MEDICATED_PAD | Freq: Once | CUTANEOUS | Status: DC

## 2023-02-15 MED ORDER — DOXYCYCLINE HYCLATE 100 MG PO TABS: 100.0000 mg | ORAL_TABLET | Freq: Two times a day (BID) | ORAL | 2 refills | Status: DC

## 2023-02-15 MED ORDER — FENTANYL CITRATE (PF) 250 MCG/5ML IJ SOLN
INTRAMUSCULAR | Status: DC | PRN
  Administered 2023-02-15 (×3): 25 ug via INTRAVENOUS

## 2023-02-15 MED ORDER — PROMETHAZINE HCL 25 MG/ML IJ SOLN: 6.2500 mg | INTRAMUSCULAR | Status: DC | PRN

## 2023-02-15 MED ORDER — LIDOCAINE HCL 1 % IJ SOLN
INTRAMUSCULAR | Status: DC | PRN
  Administered 2023-02-15: 20 mL via INTRAMUSCULAR

## 2023-02-15 MED ORDER — OXYCODONE HCL 5 MG PO TABS: 5.0000 mg | ORAL_TABLET | Freq: Once | ORAL | Status: DC | PRN

## 2023-02-15 MED ORDER — CHLORHEXIDINE GLUCONATE 0.12 % MT SOLN
15.0000 mL | Freq: Once | OROMUCOSAL | Status: AC
  Administered 2023-02-15: 15 mL via OROMUCOSAL
  Filled 2023-02-15: qty 15

## 2023-02-15 MED ORDER — FENTANYL CITRATE (PF) 250 MCG/5ML IJ SOLN
INTRAMUSCULAR | Status: AC
  Filled 2023-02-15: qty 5

## 2023-02-15 MED ORDER — PROPOFOL 10 MG/ML IV BOLUS
INTRAVENOUS | Status: DC | PRN
  Administered 2023-02-15: 100 mg via INTRAVENOUS

## 2023-02-15 MED ORDER — BUPIVACAINE HCL (PF) 0.25 % IJ SOLN
INTRAMUSCULAR | Status: AC
  Filled 2023-02-15: qty 30

## 2023-02-15 MED ORDER — PHENYLEPHRINE HCL-NACL 20-0.9 MG/250ML-% IV SOLN
INTRAVENOUS | Status: DC | PRN
  Administered 2023-02-15: 40 ug/min via INTRAVENOUS

## 2023-02-15 MED ORDER — OXYCODONE HCL 5 MG/5ML PO SOLN: 5.0000 mg | Freq: Once | ORAL | Status: DC | PRN

## 2023-02-15 MED ORDER — OXYCODONE HCL 5 MG PO TABS: 5.0000 mg | ORAL_TABLET | Freq: Four times a day (QID) | ORAL | 0 refills | Status: DC | PRN

## 2023-02-15 MED ORDER — METHYLENE BLUE (ANTIDOTE) 1 % IV SOLN
INTRAVENOUS | Status: AC
  Filled 2023-02-15: qty 10

## 2023-02-15 MED ORDER — PROPOFOL 10 MG/ML IV BOLUS
INTRAVENOUS | Status: AC
  Filled 2023-02-15: qty 20

## 2023-02-15 MED ORDER — ROCURONIUM BROMIDE 10 MG/ML (PF) SYRINGE
PREFILLED_SYRINGE | INTRAVENOUS | Status: DC | PRN
  Administered 2023-02-15: 20 mg via INTRAVENOUS
  Administered 2023-02-15: 10 mg via INTRAVENOUS
  Administered 2023-02-15: 50 mg via INTRAVENOUS
  Administered 2023-02-15: 10 mg via INTRAVENOUS

## 2023-02-15 MED ORDER — METHYLENE BLUE (ANTIDOTE) 1 % IV SOLN
INTRAVENOUS | Status: DC | PRN
  Administered 2023-02-15: 1 mL via SUBMUCOSAL

## 2023-02-15 MED ORDER — HYDROMORPHONE HCL 1 MG/ML IJ SOLN
INTRAMUSCULAR | Status: AC
  Filled 2023-02-15: qty 1

## 2023-02-15 MED ORDER — EPHEDRINE SULFATE-NACL 50-0.9 MG/10ML-% IV SOSY
PREFILLED_SYRINGE | INTRAVENOUS | Status: DC | PRN
  Administered 2023-02-15 (×8): 5 mg via INTRAVENOUS

## 2023-02-15 MED ORDER — LIDOCAINE 2% (20 MG/ML) 5 ML SYRINGE
INTRAMUSCULAR | Status: DC | PRN
  Administered 2023-02-15: 60 mg via INTRAVENOUS

## 2023-02-15 MED ORDER — AMISULPRIDE (ANTIEMETIC) 5 MG/2ML IV SOLN: 10.0000 mg | Freq: Once | INTRAVENOUS | Status: DC | PRN

## 2023-02-15 MED ORDER — LIDOCAINE-EPINEPHRINE 1 %-1:100000 IJ SOLN
INTRAMUSCULAR | Status: AC
  Filled 2023-02-15: qty 1

## 2023-02-15 MED ORDER — LIDOCAINE 2% (20 MG/ML) 5 ML SYRINGE
INTRAMUSCULAR | Status: AC
  Filled 2023-02-15: qty 5

## 2023-02-15 SURGICAL SUPPLY — 52 items
ADH SKN CLS LQ APL DERMABOND (GAUZE/BANDAGES/DRESSINGS) ×1
APL PRP STRL LF DISP 70% ISPRP (MISCELLANEOUS) ×1
BAG COUNTER SPONGE SURGICOUNT (BAG) ×1 IMPLANT
BAG SPNG CNTER NS LX DISP (BAG) ×1
BNDG CMPR MED 10X6 ELC LF (GAUZE/BANDAGES/DRESSINGS) ×1
BNDG COHESIVE 4X5 TAN STRL (GAUZE/BANDAGES/DRESSINGS) IMPLANT
BNDG ELASTIC 6X10 VLCR STRL LF (GAUZE/BANDAGES/DRESSINGS) IMPLANT
BNDG GAUZE DERMACEA FLUFF 4 (GAUZE/BANDAGES/DRESSINGS) IMPLANT
BNDG GZE DERMACEA 4 6PLY (GAUZE/BANDAGES/DRESSINGS) ×1
CANISTER SUCT 3000ML PPV (MISCELLANEOUS) ×1 IMPLANT
CHLORAPREP W/TINT 26 (MISCELLANEOUS) ×1 IMPLANT
CLIP TI MEDIUM 24 (CLIP) ×1 IMPLANT
CNTNR URN SCR LID CUP LEK RST (MISCELLANEOUS) ×3 IMPLANT
CONT SPEC 4OZ STRL OR WHT (MISCELLANEOUS) ×3
COVER PROBE W GEL 5X96 (DRAPES) ×1 IMPLANT
COVER SURGICAL LIGHT HANDLE (MISCELLANEOUS) ×1 IMPLANT
DERMABOND ADVANCED .7 DNX6 (GAUZE/BANDAGES/DRESSINGS) IMPLANT
DRSG TELFA 3X8 NADH STRL (GAUZE/BANDAGES/DRESSINGS) IMPLANT
ELECT REM PT RETURN 9FT ADLT (ELECTROSURGICAL) ×1
ELECTRODE REM PT RTRN 9FT ADLT (ELECTROSURGICAL) ×1 IMPLANT
GAUZE SPONGE 4X4 12PLY STRL (GAUZE/BANDAGES/DRESSINGS) IMPLANT
GLOVE BIO SURGEON STRL SZ 6 (GLOVE) ×1 IMPLANT
GLOVE INDICATOR 6.5 STRL GRN (GLOVE) ×1 IMPLANT
GOWN STRL REUS W/ TWL LRG LVL3 (GOWN DISPOSABLE) ×2 IMPLANT
GOWN STRL REUS W/ TWL XL LVL3 (GOWN DISPOSABLE) ×2 IMPLANT
GOWN STRL REUS W/TWL LRG LVL3 (GOWN DISPOSABLE) ×2
GOWN STRL REUS W/TWL XL LVL3 (GOWN DISPOSABLE) ×1
GRAFT MYRIAD 20X20 (Graft) IMPLANT
KIT BASIN OR (CUSTOM PROCEDURE TRAY) ×1 IMPLANT
KIT TURNOVER KIT B (KITS) ×1 IMPLANT
LIGHT WAVEGUIDE WIDE FLAT (MISCELLANEOUS) IMPLANT
MARKER SKIN DUAL TIP RULER LAB (MISCELLANEOUS) ×1 IMPLANT
NDL HYPO 25GX1X1/2 BEV (NEEDLE) ×1 IMPLANT
NEEDLE HYPO 25GX1X1/2 BEV (NEEDLE) ×1
NS IRRIG 1000ML POUR BTL (IV SOLUTION) ×1 IMPLANT
PACK GENERAL/GYN (CUSTOM PROCEDURE TRAY) ×1 IMPLANT
PACK UNIVERSAL I (CUSTOM PROCEDURE TRAY) IMPLANT
PAD ABD 8X10 STRL (GAUZE/BANDAGES/DRESSINGS) IMPLANT
PAD ARMBOARD 7.5X6 YLW CONV (MISCELLANEOUS) ×2 IMPLANT
POWDER MORCELSS FINE 2000MG (Miscellaneous) IMPLANT
PWDR MORCELSS FINE 2000MG (Miscellaneous) ×1 IMPLANT
STOCKINETTE IMPERVIOUS 9X36 MD (GAUZE/BANDAGES/DRESSINGS) ×1 IMPLANT
SUT MNCRL AB 4-0 PS2 18 (SUTURE) ×1 IMPLANT
SUT PROLENE 3 0 SH 1 (SUTURE) IMPLANT
SUT SILK 2 0 SH (SUTURE) IMPLANT
SUT SILK 3 0 SH CR/8 (SUTURE) IMPLANT
SUT VIC AB 3-0 SH 27 (SUTURE) ×1
SUT VIC AB 3-0 SH 27X BRD (SUTURE) ×2 IMPLANT
SUT VIC AB 3-0 SH 8-18 (SUTURE) IMPLANT
SYR CONTROL 10ML LL (SYRINGE) ×2 IMPLANT
TOWEL GREEN STERILE (TOWEL DISPOSABLE) ×1 IMPLANT
TOWEL GREEN STERILE FF (TOWEL DISPOSABLE) ×1 IMPLANT

## 2023-02-15 NOTE — Discharge Instructions (Signed)
Wound care: Remove dressings on the forearm down to the blue/green mesh.   Apply a generous amount of surgilube. Then do non stick gauze Then cover with any type of gauze. Lastly wrap with ace wrap.      [facility] Office Phone Number [phone]   POST OP INSTRUCTIONS  Always review your discharge instruction sheet given to you by the facility where your surgery was performed.  IF YOU HAVE DISABILITY OR FAMILY LEAVE FORMS, YOU MUST BRING THEM TO THE OFFICE FOR PROCESSING.  DO NOT GIVE THEM TO YOUR DOCTOR.  Take 2 tylenol (acetominophen) three times a day for 3 days.  If you still have pain, add ibuprofen with food in between if able to take this (if you have kidney issues or stomach issues, do not take ibuprofen).  If both of those are not enough, add the narcotic pain pill.  If you find you are needing a lot of this overnight after surgery, call the next morning for a refill.   Take your usually prescribed medications unless otherwise directed If you need a refill on your pain medication, please contact your pharmacy.  They will contact our office to request authorization.  Prescriptions will not be filled after 5pm or on week-ends. You should eat very light the first 24 hours after surgery, such as soup, crackers, pudding, etc.  Resume your normal diet the day after surgery It is common to experience some constipation if taking pain medication after surgery.  Increasing fluid intake and taking a stool softener will usually help or prevent this problem from occurring.  A mild laxative (Milk of Magnesia or Miralax) should be taken according to package directions if there are no bowel movements after 48 hours. You may shower in 48 hours.  The surgical glue will flake off in 2-3 weeks.   ACTIVITIES:  No strenuous activity or heavy lifting for 4 weeks.   You may drive when you no longer are taking prescription pain medication, you can comfortably wear a seatbelt, and you can  safely maneuver your car and apply brakes. RETURN TO WORK:  __________n/a_______________ Bonita Quin should see your doctor in the office for a follow-up appointment approximately three-four weeks after your surgery.    WHEN TO CALL YOUR DOCTOR: Fever over 101.0 Nausea and/or vomiting. Extreme swelling or bruising. Continued bleeding from incision. Increased pain, redness, or drainage from the incision.  The clinic staff is available to answer your questions during regular business hours.  Please don't hesitate to call and ask to speak to one of the nurses for clinical concerns.  If you have a medical emergency, go to the nearest emergency room or call 911.  A surgeon from [name] is always on call at the hospital.  For further questions, please visit [email or web address]

## 2023-02-15 NOTE — Anesthesia Procedure Notes (Signed)
Procedure Name: Intubation Date/Time: 02/15/2023 9:14 AM  Performed by: Nadara Mustard, CRNAPre-anesthesia Checklist: Patient identified, Emergency Drugs available, Suction available and Patient being monitored Patient Re-evaluated:Patient Re-evaluated prior to induction Oxygen Delivery Method: Circle system utilized Preoxygenation: Pre-oxygenation with 100% oxygen Induction Type: IV induction Ventilation: Mask ventilation without difficulty Laryngoscope Size: Glidescope and 3 Grade View: Grade I Tube type: Oral Tube size: 7.5 mm Number of attempts: 1 Airway Equipment and Method: Stylet and Oral airway Placement Confirmation: ETT inserted through vocal cords under direct vision, positive ETCO2 and breath sounds checked- equal and bilateral Tube secured with: Tape Dental Injury: Teeth and Oropharynx as per pre-operative assessment

## 2023-02-15 NOTE — Anesthesia Postprocedure Evaluation (Signed)
Anesthesia Post Note  Patient: Aaron Gibbs  Procedure(s) Performed: WIDE LOCAL EXCISION LEFT FOREARM MELANOMA SITES WITH SKIN SUBSTITUTE COVERAGE, SENTINEL LYMPH NODE BIOPSY     Patient location during evaluation: PACU Anesthesia Type: General Level of consciousness: awake and alert, oriented and patient cooperative Pain management: pain level controlled Vital Signs Assessment: post-procedure vital signs reviewed and stable Respiratory status: spontaneous breathing, nonlabored ventilation and respiratory function stable Cardiovascular status: blood pressure returned to baseline and stable Postop Assessment: no apparent nausea or vomiting Anesthetic complications: no   No notable events documented.  Last Vitals:  Vitals:   02/15/23 1245 02/15/23 1301  BP: (!) 147/83 (!) 149/80  Pulse: 73 67  Resp: 19 11  Temp:  36.6 C  SpO2: 96% 95%    Last Pain:  Vitals:   02/15/23 1140  TempSrc:   PainSc: Asleep                 Lannie Fields

## 2023-02-15 NOTE — Interval H&P Note (Signed)
History and Physical Interval Note:  02/15/2023 8:54 AM  Aaron Gibbs  has presented today for surgery, with the diagnosis of LEFT FOREARM MELANOMA.  The various methods of treatment have been discussed with the patient and family. After consideration of risks, benefits and other options for treatment, the patient has consented to  Procedure(s): WIDE LOCAL EXCISION LEFT FOREARM MELANOMA SITES WITH SKIN SUBSTITUTE COVERAGE, SENTINEL LYMPH NODE BIOPSY (N/A) as a surgical intervention.  The patient's history has been reviewed, patient examined, no change in status, stable for surgery.  I have reviewed the patient's chart and labs.  Questions were answered to the patient's satisfaction.     [name]

## 2023-02-15 NOTE — Transfer of Care (Signed)
Immediate Anesthesia Transfer of Care Note  Patient: Aaron Gibbs  Procedure(s) Performed: WIDE LOCAL EXCISION LEFT FOREARM MELANOMA SITES WITH SKIN SUBSTITUTE COVERAGE, SENTINEL LYMPH NODE BIOPSY  Patient Location: PACU  Anesthesia Type:General  Level of Consciousness: drowsy and patient cooperative  Airway & Oxygen Therapy: Patient Spontanous Breathing and Patient connected to face mask oxygen  Post-op Assessment: Report given to RN and Post -op Vital signs reviewed and stable  Post vital signs: Reviewed and stable  Last Vitals:  Vitals Value Taken Time  BP 160/94 02/15/23 1140  Temp    Pulse 76 02/15/23 1142  Resp 15 02/15/23 1142  SpO2 100 % 02/15/23 1142  Vitals shown include unfiled device data.  Last Pain:  Vitals:   02/15/23 0655  TempSrc:   PainSc: 0-No pain      Patients Stated Pain Goal: 0 (02/15/23 0655)  Complications: No notable events documented.

## 2023-02-15 NOTE — Op Note (Signed)
PRE-OPERATIVE DIAGNOSIS: left forearm melanoma, in transit metastases   POST-OPERATIVE DIAGNOSIS:  Same  PROCEDURE:  Procedure(s): Wide local excision 1 cm margins, placement of skin substitute for defect 13.8 x 11.4 cm, sentinel lymph node mapping and biopsy  SURGEON:  Surgeon(s): [name], MD  ASSIST:  Jeronimo Greaves, RNFA  ANESTHESIA:   local and general  DRAINS: none   LOCAL MEDICATIONS USED:  MARCAINE    and XYLOCAINE   SPECIMEN:  Source of Specimen:  wide local excision left forearm melanoma, additional distal margin, additional lateral margin, SLN #1, SLN #2, SLN #3  FINDINGS:  large necrotic melanoma with several satellite lesions that were subdermal and multiple intradermal lesions.    DISPOSITION OF SPECIMEN:  PATHOLOGY  COUNTS:  YES  PLAN OF CARE: Discharge to home after PACU  PATIENT DISPOSITION:  PACU - hemodynamically stable.    PROCEDURE:   Pt was identified in the holding area, taken to the OR, and placed supine on the OR table.  General anesthesia was induced.  Time out was performed according to the surgical safety checklist.  When all was correct, we continued.  One mL methylene blue was injected intradermally around the melanoma biopsy site.    The patient was placed into the supine position with appropriate padding.  The left arm/axilla was prepped and draped in sterile fashion.  The melanoma was identified.  Local was administered under the melanoma and the adjacent tissue.  A #10 blade was used to incise the skin around the melanoma.  The cautery was used to take the dissection down to the fascia.  The skin was marked in situ with orientation sutures.  The cautery was used to take the specimen off the fascia, and it was passed off the table.  Additional margin was taken distally due to a visible subdermal met.  Additional margin was taken laterally to incorporate the intradermal lesions.  The defect was 13.8 x 11.4.    A 20 x 20 cm sheet of myriad  matrix was selected. This was trimmed to the appropriate size.  The was secured to the skin border with interrupted 3-0 vicryl suture.  Myriad morcells were placed under the matrix prior to completely closing it down.  This was then covered with sorbact and secured with interrupted 3-0 prolene.    The axilla was then evaluated.  A 5 cm incision was made with a #10 blade.  The subcutaneous tissues were divided with the cautery.  A Weitlaner retractor was used to assist with visualization.  The tonsil clamp was used to bluntly dissect the axillary fat pad.  The large palpable node was removed.  Two additional firm nodes were identified.  The lymphovascular channels were clipped with hemoclips.  The nodes were passed off as specimens.  Hemostasis was achieved with the cautery.  The axilla was irrigated and closed with 3-0 Vicryl deep dermal interrupted sutures and 4-0 Monocryl running subcuticular suture.  The axilla was dressed with dermabond.    The melanoma site was cleaned, dried, and dressed with surgilube, telfa, gauze, ace wrap.  The axillary wound was dressed with dermabond.     Needle, sponge, and instrument counts were correct.  The patient was awakened from anesthesia and taken to the PACU in stable condition.

## 2023-02-15 NOTE — Progress Notes (Signed)
VAST visited patient to follow up on PICC. Per Dr. Donell Beers, patient will be discharged with PICC in place. Dr. Donell Beers to place orders for home health care once she is out of OR.

## 2023-02-15 NOTE — Progress Notes (Signed)
Patient arrived to short stay for procedure with Dr. Donell Beers. Patient was seen 7/25 for same procedure however was unable to complete due to IV infiltration and inability to place central line. IR placed SL PICC on 8/2 however it doesn't appear that home health was ordered for routine line maintenance and flushes. Patient presents today with a compromised and bloody PICC dressing from initial insertion on 8/2. Will send secure chat to Dr. Donell Beers to determine if PICC is to remain in place after prodedure; if so, home health orders are needed to ensure line remains patent and infection free.   Ranada Vigorito Loyola Mast, RN

## 2023-02-16 ENCOUNTER — Encounter (HOSPITAL_COMMUNITY): Payer: Self-pay | Admitting: General Surgery

## 2023-02-17 ENCOUNTER — Inpatient Hospital Stay: Payer: Medicare Other

## 2023-02-17 ENCOUNTER — Inpatient Hospital Stay
Admission: EM | Admit: 2023-02-17 | Discharge: 2023-02-22 | DRG: 309 | Disposition: A | Discharge status: Hospice | Payer: Medicare Other | Attending: Internal Medicine | Admitting: Internal Medicine

## 2023-02-17 ENCOUNTER — Emergency Department: Payer: Medicare Other

## 2023-02-17 ENCOUNTER — Other Ambulatory Visit: Payer: Self-pay

## 2023-02-17 DIAGNOSIS — E669 Obesity, unspecified: Secondary | ICD-10-CM | POA: Diagnosis present

## 2023-02-17 DIAGNOSIS — H3552 Pigmentary retinal dystrophy: Secondary | ICD-10-CM

## 2023-02-17 DIAGNOSIS — I4729 Other ventricular tachycardia: Secondary | ICD-10-CM

## 2023-02-17 DIAGNOSIS — Z87891 Personal history of nicotine dependence: Secondary | ICD-10-CM

## 2023-02-17 DIAGNOSIS — C439 Malignant melanoma of skin, unspecified: Secondary | ICD-10-CM | POA: Diagnosis present

## 2023-02-17 DIAGNOSIS — J449 Chronic obstructive pulmonary disease, unspecified: Secondary | ICD-10-CM | POA: Diagnosis present

## 2023-02-17 DIAGNOSIS — D696 Thrombocytopenia, unspecified: Secondary | ICD-10-CM | POA: Diagnosis present

## 2023-02-17 DIAGNOSIS — I5032 Chronic diastolic (congestive) heart failure: Secondary | ICD-10-CM | POA: Diagnosis not present

## 2023-02-17 DIAGNOSIS — R54 Age-related physical debility: Secondary | ICD-10-CM | POA: Diagnosis present

## 2023-02-17 DIAGNOSIS — R55 Syncope and collapse: Secondary | ICD-10-CM | POA: Diagnosis not present

## 2023-02-17 DIAGNOSIS — I110 Hypertensive heart disease with heart failure: Secondary | ICD-10-CM | POA: Diagnosis present

## 2023-02-17 DIAGNOSIS — I959 Hypotension, unspecified: Secondary | ICD-10-CM | POA: Diagnosis present

## 2023-02-17 DIAGNOSIS — F418 Other specified anxiety disorders: Secondary | ICD-10-CM | POA: Diagnosis not present

## 2023-02-17 DIAGNOSIS — E785 Hyperlipidemia, unspecified: Secondary | ICD-10-CM | POA: Diagnosis present

## 2023-02-17 DIAGNOSIS — G8929 Other chronic pain: Secondary | ICD-10-CM | POA: Diagnosis present

## 2023-02-17 DIAGNOSIS — C4361 Malignant melanoma of right upper limb, including shoulder: Secondary | ICD-10-CM | POA: Diagnosis present

## 2023-02-17 DIAGNOSIS — C792 Secondary malignant neoplasm of skin: Secondary | ICD-10-CM | POA: Diagnosis present

## 2023-02-17 DIAGNOSIS — W1830XS Fall on same level, unspecified, sequela: Secondary | ICD-10-CM | POA: Diagnosis not present

## 2023-02-17 DIAGNOSIS — Z79891 Long term (current) use of opiate analgesic: Secondary | ICD-10-CM | POA: Diagnosis not present

## 2023-02-17 DIAGNOSIS — R531 Weakness: Secondary | ICD-10-CM | POA: Diagnosis not present

## 2023-02-17 DIAGNOSIS — D638 Anemia in other chronic diseases classified elsewhere: Secondary | ICD-10-CM | POA: Diagnosis present

## 2023-02-17 DIAGNOSIS — I472 Ventricular tachycardia, unspecified: Secondary | ICD-10-CM | POA: Diagnosis not present

## 2023-02-17 DIAGNOSIS — Z1152 Encounter for screening for COVID-19: Secondary | ICD-10-CM

## 2023-02-17 DIAGNOSIS — S12100S Unspecified displaced fracture of second cervical vertebra, sequela: Secondary | ICD-10-CM

## 2023-02-17 DIAGNOSIS — H5440 Blindness, one eye, unspecified eye: Secondary | ICD-10-CM | POA: Diagnosis not present

## 2023-02-17 DIAGNOSIS — Z8249 Family history of ischemic heart disease and other diseases of the circulatory system: Secondary | ICD-10-CM

## 2023-02-17 DIAGNOSIS — D649 Anemia, unspecified: Secondary | ICD-10-CM | POA: Diagnosis present

## 2023-02-17 DIAGNOSIS — Z6832 Body mass index (BMI) 32.0-32.9, adult: Secondary | ICD-10-CM | POA: Diagnosis not present

## 2023-02-17 DIAGNOSIS — Z66 Do not resuscitate: Secondary | ICD-10-CM | POA: Diagnosis present

## 2023-02-17 DIAGNOSIS — C779 Secondary and unspecified malignant neoplasm of lymph node, unspecified: Secondary | ICD-10-CM | POA: Diagnosis present

## 2023-02-17 DIAGNOSIS — S12190S Other displaced fracture of second cervical vertebra, sequela: Secondary | ICD-10-CM

## 2023-02-17 DIAGNOSIS — Z791 Long term (current) use of non-steroidal anti-inflammatories (NSAID): Secondary | ICD-10-CM

## 2023-02-17 DIAGNOSIS — H548 Legal blindness, as defined in USA: Secondary | ICD-10-CM | POA: Diagnosis present

## 2023-02-17 DIAGNOSIS — R627 Adult failure to thrive: Secondary | ICD-10-CM | POA: Diagnosis present

## 2023-02-17 DIAGNOSIS — R296 Repeated falls: Secondary | ICD-10-CM | POA: Diagnosis not present

## 2023-02-17 DIAGNOSIS — Z9151 Personal history of suicidal behavior: Secondary | ICD-10-CM

## 2023-02-17 DIAGNOSIS — I7143 Infrarenal abdominal aortic aneurysm, without rupture: Secondary | ICD-10-CM | POA: Diagnosis present

## 2023-02-17 DIAGNOSIS — Z79899 Other long term (current) drug therapy: Secondary | ICD-10-CM

## 2023-02-17 DIAGNOSIS — H547 Unspecified visual loss: Secondary | ICD-10-CM

## 2023-02-17 DIAGNOSIS — R0902 Hypoxemia: Secondary | ICD-10-CM | POA: Diagnosis present

## 2023-02-17 DIAGNOSIS — Z818 Family history of other mental and behavioral disorders: Secondary | ICD-10-CM

## 2023-02-17 DIAGNOSIS — Z515 Encounter for palliative care: Secondary | ICD-10-CM

## 2023-02-17 DIAGNOSIS — I440 Atrioventricular block, first degree: Secondary | ICD-10-CM | POA: Diagnosis present

## 2023-02-17 DIAGNOSIS — Z95828 Presence of other vascular implants and grafts: Secondary | ICD-10-CM

## 2023-02-17 DIAGNOSIS — I447 Left bundle-branch block, unspecified: Secondary | ICD-10-CM | POA: Diagnosis present

## 2023-02-17 DIAGNOSIS — M810 Age-related osteoporosis without current pathological fracture: Secondary | ICD-10-CM | POA: Diagnosis present

## 2023-02-17 DIAGNOSIS — S12100A Unspecified displaced fracture of second cervical vertebra, initial encounter for closed fracture: Secondary | ICD-10-CM | POA: Diagnosis not present

## 2023-02-17 DIAGNOSIS — J441 Chronic obstructive pulmonary disease with (acute) exacerbation: Secondary | ICD-10-CM | POA: Diagnosis not present

## 2023-02-17 DIAGNOSIS — I950 Idiopathic hypotension: Secondary | ICD-10-CM | POA: Diagnosis not present

## 2023-02-17 HISTORY — DX: Alcohol abuse, in remission: F10.11

## 2023-02-17 HISTORY — DX: Unspecified osteoarthritis, unspecified site: M19.90

## 2023-02-17 HISTORY — DX: Pigmentary retinal dystrophy: H35.52

## 2023-02-17 HISTORY — DX: Repeated falls: R29.6

## 2023-02-17 HISTORY — DX: Malignant melanoma of skin, unspecified: C43.9

## 2023-02-17 HISTORY — DX: Ankylosing spondylitis lumbar region: M45.6

## 2023-02-17 HISTORY — DX: Melanoma in situ of left upper limb, including shoulder: D03.62

## 2023-02-17 HISTORY — DX: Unspecified diastolic (congestive) heart failure: I50.30

## 2023-02-17 HISTORY — DX: Personal history of nicotine dependence: Z87.891

## 2023-02-17 HISTORY — DX: Gastro-esophageal reflux disease without esophagitis: K21.9

## 2023-02-17 HISTORY — DX: Sensorineural hearing loss, bilateral: H90.3

## 2023-02-17 HISTORY — DX: Major depressive disorder, recurrent severe without psychotic features: F33.2

## 2023-02-17 HISTORY — DX: Secondary and unspecified malignant neoplasm of lymph node, unspecified: C77.9

## 2023-02-17 HISTORY — DX: Abdominal aortic aneurysm, without rupture, unspecified: I71.40

## 2023-02-17 HISTORY — DX: Ankylosing hyperostosis (forestier), site unspecified: M48.10

## 2023-02-17 HISTORY — DX: Opioid dependence, uncomplicated: F11.20

## 2023-02-17 LAB — CBC
HCT: 29 % — ABNORMAL LOW (ref 39.0–52.0)
Hemoglobin: 8.6 g/dL — ABNORMAL LOW (ref 13.0–17.0)
MCH: 29 pg (ref 26.0–34.0)
MCHC: 29.7 g/dL — ABNORMAL LOW (ref 30.0–36.0)
MCV: 97.6 fL (ref 80.0–100.0)
Platelets: 137 10*3/uL — ABNORMAL LOW (ref 150–400)
RBC: 2.97 MIL/uL — ABNORMAL LOW (ref 4.22–5.81)
RDW: 16 % — ABNORMAL HIGH (ref 11.5–15.5)
WBC: 5.2 10*3/uL (ref 4.0–10.5)
nRBC: 0 % (ref 0.0–0.2)

## 2023-02-17 LAB — BASIC METABOLIC PANEL
Anion gap: 9 (ref 5–15)
BUN: 22 mg/dL (ref 8–23)
CO2: 26 mmol/L (ref 22–32)
Calcium: 8.5 mg/dL — ABNORMAL LOW (ref 8.9–10.3)
Chloride: 104 mmol/L (ref 98–111)
Creatinine, Ser: 1.18 mg/dL (ref 0.61–1.24)
GFR, Estimated: >60 mL/min (ref >60)
Glucose, Bld: 124 mg/dL — ABNORMAL HIGH (ref 70–99)
Potassium: 4.2 mmol/L (ref 3.5–5.1)
Sodium: 139 mmol/L (ref 135–145)

## 2023-02-17 LAB — LACTIC ACID, PLASMA
Lactic Acid, Venous: 1.2 mmol/L (ref 0.5–1.9)
Lactic Acid, Venous: 0.6 mmol/L (ref 0.5–1.9)

## 2023-02-17 LAB — D-DIMER, QUANTITATIVE: D-Dimer, Quant: 1.28 ug{FEU}/mL — ABNORMAL HIGH (ref 0.00–0.50)

## 2023-02-17 LAB — SARS CORONAVIRUS 2 BY RT PCR: SARS Coronavirus 2 by RT PCR: NEGATIVE

## 2023-02-17 LAB — TROPONIN I (HIGH SENSITIVITY)
Troponin I (High Sensitivity): 14 ng/L (ref <18)
Troponin I (High Sensitivity): 15 ng/L (ref <18)

## 2023-02-17 LAB — MAGNESIUM: Magnesium: 2 mg/dL (ref 1.7–2.4)

## 2023-02-17 MED ORDER — SODIUM CHLORIDE 0.9% FLUSH
3.0000 mL | Freq: Two times a day (BID) | INTRAVENOUS | Status: DC
  Administered 2023-02-18 – 2023-02-22 (×9): 3 mL via INTRAVENOUS

## 2023-02-17 MED ORDER — DULOXETINE HCL 30 MG PO CPEP
90.0000 mg | ORAL_CAPSULE | Freq: Every day | ORAL | Status: DC
  Administered 2023-02-18 – 2023-02-21 (×4): 90 mg via ORAL
  Filled 2023-02-17 (×5): qty 3

## 2023-02-17 MED ORDER — SODIUM CHLORIDE 0.9 % IV BOLUS
500.0000 mL | Freq: Once | INTRAVENOUS | Status: AC
  Administered 2023-02-17: 500 mL via INTRAVENOUS

## 2023-02-17 MED ORDER — DORZOLAMIDE HCL-TIMOLOL MAL 2-0.5 % OP SOLN
1.0000 [drp] | Freq: Two times a day (BID) | OPHTHALMIC | Status: DC
  Administered 2023-02-18 – 2023-02-22 (×9): 1 [drp] via OPHTHALMIC
  Filled 2023-02-17 (×2): qty 10

## 2023-02-17 MED ORDER — GABAPENTIN 300 MG PO CAPS
300.0000 mg | ORAL_CAPSULE | Freq: Two times a day (BID) | ORAL | Status: DC
  Administered 2023-02-17 – 2023-02-22 (×10): 300 mg via ORAL
  Filled 2023-02-17 (×10): qty 1

## 2023-02-17 MED ORDER — AMIODARONE LOAD VIA INFUSION
150.0000 mg | Freq: Once | INTRAVENOUS | Status: AC
  Administered 2023-02-17: 150 mg via INTRAVENOUS
  Filled 2023-02-17: qty 83.34

## 2023-02-17 MED ORDER — OXYCODONE HCL 5 MG PO TABS
5.0000 mg | ORAL_TABLET | Freq: Four times a day (QID) | ORAL | Status: DC | PRN
  Administered 2023-02-18 – 2023-02-19 (×4): 5 mg via ORAL
  Filled 2023-02-17 (×4): qty 1

## 2023-02-17 MED ORDER — FENTANYL CITRATE PF 50 MCG/ML IJ SOSY
50.0000 ug | PREFILLED_SYRINGE | Freq: Once | INTRAMUSCULAR | Status: AC
  Administered 2023-02-17: 50 ug via INTRAVENOUS
  Filled 2023-02-17: qty 1

## 2023-02-17 MED ORDER — FERROUS SULFATE 325 (65 FE) MG PO TABS
325.0000 mg | ORAL_TABLET | Freq: Every day | ORAL | Status: DC
  Administered 2023-02-18 – 2023-02-21 (×4): 325 mg via ORAL
  Filled 2023-02-17 (×4): qty 1

## 2023-02-17 MED ORDER — AMIODARONE HCL IN DEXTROSE 360-4.14 MG/200ML-% IV SOLN
30.0000 mg/h | INTRAVENOUS | Status: DC
  Administered 2023-02-18 – 2023-02-20 (×5): 30 mg/h via INTRAVENOUS
  Filled 2023-02-17 (×5): qty 200

## 2023-02-17 MED ORDER — BUPRENORPHINE 10 MCG/HR TD PTWK
1.0000 | MEDICATED_PATCH | TRANSDERMAL | Status: DC
  Administered 2023-02-18: 1 via TRANSDERMAL
  Filled 2023-02-17: qty 1

## 2023-02-17 MED ORDER — ONDANSETRON HCL 4 MG/2ML IJ SOLN: 4.0000 mg | Freq: Four times a day (QID) | INTRAMUSCULAR | Status: DC | PRN

## 2023-02-17 MED ORDER — ALBUTEROL SULFATE HFA 108 (90 BASE) MCG/ACT IN AERS: 2.0000 | INHALATION_SPRAY | RESPIRATORY_TRACT | Status: DC | PRN

## 2023-02-17 MED ORDER — CARVEDILOL 6.25 MG PO TABS: 12.5000 mg | ORAL_TABLET | Freq: Two times a day (BID) | ORAL | Status: DC

## 2023-02-17 MED ORDER — POLYETHYLENE GLYCOL 3350 17 G PO PACK
17.0000 g | PACK | Freq: Two times a day (BID) | ORAL | Status: DC
  Administered 2023-02-18 – 2023-02-21 (×8): 17 g via ORAL
  Filled 2023-02-17 (×8): qty 1

## 2023-02-17 MED ORDER — DOXYCYCLINE HYCLATE 100 MG PO TABS
100.0000 mg | ORAL_TABLET | Freq: Two times a day (BID) | ORAL | Status: DC
  Administered 2023-02-17 – 2023-02-22 (×10): 100 mg via ORAL
  Filled 2023-02-17 (×10): qty 1

## 2023-02-17 MED ORDER — TRAZODONE HCL 50 MG PO TABS
150.0000 mg | ORAL_TABLET | Freq: Every evening | ORAL | Status: DC | PRN
  Administered 2023-02-18 – 2023-02-21 (×4): 150 mg via ORAL
  Filled 2023-02-17 (×4): qty 3

## 2023-02-17 MED ORDER — SODIUM CHLORIDE 0.9 % IV BOLUS
1000.0000 mL | Freq: Once | INTRAVENOUS | Status: AC
  Administered 2023-02-17: 1000 mL via INTRAVENOUS

## 2023-02-17 MED ORDER — AMIODARONE HCL IN DEXTROSE 360-4.14 MG/200ML-% IV SOLN
60.0000 mg/h | INTRAVENOUS | Status: DC
  Administered 2023-02-17 (×2): 60 mg/h via INTRAVENOUS
  Filled 2023-02-17 (×2): qty 200

## 2023-02-17 MED ORDER — ENOXAPARIN SODIUM 40 MG/0.4ML IJ SOSY
40.0000 mg | PREFILLED_SYRINGE | INTRAMUSCULAR | Status: DC
  Administered 2023-02-18 – 2023-02-22 (×5): 40 mg via SUBCUTANEOUS
  Filled 2023-02-17 (×5): qty 0.4

## 2023-02-17 MED ORDER — LATANOPROST 0.005 % OP SOLN
1.0000 [drp] | Freq: Every day | OPHTHALMIC | Status: DC
  Administered 2023-02-18 – 2023-02-21 (×4): 1 [drp] via OPHTHALMIC
  Filled 2023-02-17 (×2): qty 2.5

## 2023-02-17 MED ORDER — SODIUM CHLORIDE 0.9 % IV BOLUS
250.0000 mL | Freq: Once | INTRAVENOUS | Status: AC
  Administered 2023-02-17: 250 mL via INTRAVENOUS

## 2023-02-17 MED ORDER — DICLOFENAC SODIUM 1 % EX GEL
2.0000 g | Freq: Every day | CUTANEOUS | Status: DC
  Administered 2023-02-21: 2 g via TOPICAL
  Filled 2023-02-17 (×2): qty 100

## 2023-02-17 MED ORDER — ALBUTEROL SULFATE (2.5 MG/3ML) 0.083% IN NEBU: 2.5000 mg | INHALATION_SOLUTION | RESPIRATORY_TRACT | Status: DC | PRN

## 2023-02-17 MED ORDER — ONDANSETRON HCL 4 MG PO TABS: 4.0000 mg | ORAL_TABLET | Freq: Four times a day (QID) | ORAL | Status: DC | PRN

## 2023-02-17 MED ORDER — OLANZAPINE 5 MG PO TABS
2.5000 mg | ORAL_TABLET | Freq: Two times a day (BID) | ORAL | Status: DC
  Administered 2023-02-17 – 2023-02-22 (×10): 2.5 mg via ORAL
  Filled 2023-02-17 (×10): qty 1

## 2023-02-17 MED ORDER — MELATONIN 5 MG PO TABS
5.0000 mg | ORAL_TABLET | Freq: Every day | ORAL | Status: DC
  Administered 2023-02-17 – 2023-02-21 (×5): 5 mg via ORAL
  Filled 2023-02-17 (×5): qty 1

## 2023-02-17 MED ORDER — FINASTERIDE 5 MG PO TABS
5.0000 mg | ORAL_TABLET | Freq: Every day | ORAL | Status: DC
  Administered 2023-02-18 – 2023-02-22 (×5): 5 mg via ORAL
  Filled 2023-02-17 (×5): qty 1

## 2023-02-17 MED ORDER — TAMSULOSIN HCL 0.4 MG PO CAPS
0.4000 mg | ORAL_CAPSULE | Freq: Every day | ORAL | Status: DC
  Administered 2023-02-18 – 2023-02-22 (×5): 0.4 mg via ORAL
  Filled 2023-02-17 (×5): qty 1

## 2023-02-17 MED ORDER — IOHEXOL 350 MG/ML SOLN
150.0000 mL | Freq: Once | INTRAVENOUS | Status: AC | PRN
  Administered 2023-02-17: 150 mL via INTRAVENOUS

## 2023-02-17 MED ORDER — SODIUM CHLORIDE 0.9 % IV SOLN: INTRAVENOUS | Status: AC

## 2023-02-17 MED ORDER — PANTOPRAZOLE SODIUM 40 MG PO TBEC
40.0000 mg | DELAYED_RELEASE_TABLET | Freq: Every day | ORAL | Status: DC
  Administered 2023-02-18 – 2023-02-22 (×5): 40 mg via ORAL
  Filled 2023-02-17 (×5): qty 1

## 2023-02-17 MED ORDER — ACETAMINOPHEN 325 MG PO TABS
650.0000 mg | ORAL_TABLET | Freq: Four times a day (QID) | ORAL | Status: DC | PRN
  Administered 2023-02-19 – 2023-02-22 (×3): 650 mg via ORAL
  Filled 2023-02-17 (×3): qty 2

## 2023-02-17 MED ORDER — MORPHINE SULFATE (PF) 4 MG/ML IV SOLN
4.0000 mg | Freq: Once | INTRAVENOUS | Status: AC
  Administered 2023-02-17: 4 mg via INTRAVENOUS
  Filled 2023-02-17: qty 1

## 2023-02-17 MED ORDER — ACETAMINOPHEN 650 MG RE SUPP: 650.0000 mg | Freq: Four times a day (QID) | RECTAL | Status: DC | PRN

## 2023-02-17 NOTE — Assessment & Plan Note (Addendum)
Continue trazodone and duloxetine and Zyprexa

## 2023-02-17 NOTE — ED Provider Notes (Signed)
[facility] Provider Note    Event Date/Time   First MD Initiated Contact with Patient 02/17/23 1754     (approximate)   History   Weakness   HPI  Graciano Hogland is a 88 y.o. male with a history of CHF, COPD, hypertension, hyperlipidemia, GERD, anemia, anxiety, and arthritis who presents with generalized weakness, acute onset today, and associated with some confusion per his caregiver.  The patient states that his primary complaint currently is neck pain which has been chronic since a fracture there, but has worsened in the last few days.  He denies any new trauma to the neck.  He has no numbness or weakness in the arms or legs.  The patient denies any difficulty breathing, nausea or vomiting, or fever.  He does not feel lightheaded or dizzy but does feel weak.  I reviewed the past medical records.  The patient was most recently seen by general surgery on 8/8 for a wound recheck.  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  He is on an oral antibiotic that was started at the time of the excision.  He had a cervical spine CT on 6/1 showing a mildly displaced dens fracture.   Physical Exam   Triage Vital Signs: ED Triage Vitals [02/17/23 1643]  Encounter Vitals Group     BP (!) 154/71     Systolic BP Percentile      Diastolic BP Percentile      Pulse Rate 67     Resp 18     Temp 97.7 F (36.5 C)     Temp src      SpO2 (!) 89 %     Weight      Height      Head Circumference      Peak Flow      Pain Score      Pain Loc      Pain Education      Exclude from Growth Chart     Most recent vital signs: Vitals:   02/17/23 2110 02/17/23 2130  BP: (!) 75/50 (!) 94/45  Pulse: (!) 49   Resp: 15 16  Temp:    SpO2:       General: Alert, oriented x 4, weak appearing. CV:  Good peripheral perfusion.  Resp:  Normal  effort.  Abd:  Soft with no focal tenderness.  No distention.  Other:  5/5 motor strength and intact sensation of bilateral upper and lower extremities.  Mild midline cervical spinal tenderness with no step-off or crepitus.  Neck brace in place.     ED Results / Procedures / Treatments   Labs (all labs ordered are listed, but only abnormal results are displayed) Labs Reviewed  BASIC METABOLIC PANEL - Abnormal; Notable for the following components:      Result Value   Glucose, Bld 124 (*)    Calcium 8.5 (*)    All other components within normal limits  CBC - Abnormal; Notable for the following components:   RBC 2.97 (*)    Hemoglobin 8.6 (*)    HCT 29.0 (*)    MCHC 29.7 (*)    RDW 16.0 (*)    Platelets 137 (*)    All other components within normal limits  D-DIMER, QUANTITATIVE - Abnormal; Notable for the following components:  D-Dimer, Quant 1.28 (*)    All other components within normal limits  SARS CORONAVIRUS 2 BY RT PCR  LACTIC ACID, PLASMA  LACTIC ACID, PLASMA  MAGNESIUM  URINALYSIS, ROUTINE W REFLEX MICROSCOPIC  PROCALCITONIN  BASIC METABOLIC PANEL  CBC  MAGNESIUM  CREATININE, SERUM  CBG MONITORING, ED  TROPONIN I (HIGH SENSITIVITY)  TROPONIN I (HIGH SENSITIVITY)     EKG  ED ECG REPORT I, Dionne Bucy, the attending physician, personally viewed and interpreted this ECG.  Date: 02/17/2023 EKG Time: 1653 Rate: 67 Rhythm: normal sinus rhythm QRS Axis: normal Intervals: normal ST/T Wave abnormalities: normal Narrative Interpretation: no evidence of acute ischemia   ED ECG REPORT I, Dionne Bucy, the attending physician, personally viewed and interpreted this ECG.  Date: 02/17/2023 EKG Time: 1951 Rate: 121 Rhythm: Ventricular tachycardia Narrative Interpretation: no evidence of acute ischemia    RADIOLOGY  Chest x-ray: I independently viewed and interpreted the images; chronic appearing opacities with no focal consolidation or  edema   PROCEDURES:  Critical Care performed: Yes, see critical care procedure note(s)  .Critical Care  Performed by: Dionne Bucy, MD Authorized by: Dionne Bucy, MD   Critical care provider statement:    Critical care time (minutes):  30   Critical care time was exclusive of:  Separately billable procedures and treating other patients   Critical care was necessary to treat or prevent imminent or life-threatening deterioration of the following conditions:  Cardiac failure   Critical care was time spent personally by me on the following activities:  Development of treatment plan with patient or surrogate, discussions with consultants, evaluation of patient's response to treatment, examination of patient, ordering and review of laboratory studies, ordering and review of radiographic studies, ordering and performing treatments and interventions, pulse oximetry, re-evaluation of patient's condition, review of old charts and obtaining history from patient or surrogate   Care discussed with: admitting provider      MEDICATIONS ORDERED IN ED: Medications  amiodarone (NEXTERONE) 1.8 mg/mL load via infusion 150 mg (150 mg Intravenous Bolus from Bag 02/17/23 2015)    Followed by  amiodarone (NEXTERONE PREMIX) 360-4.14 MG/200ML-% (1.8 mg/mL) IV infusion (60 mg/hr Intravenous New Bag/Given 02/17/23 2314)    Followed by  amiodarone (NEXTERONE PREMIX) 360-4.14 MG/200ML-% (1.8 mg/mL) IV infusion (has no administration in time range)  buprenorphine (BUTRANS) 10 MCG/HR 1 patch (has no administration in time range)  oxyCODONE (Oxy IR/ROXICODONE) immediate release tablet 5 mg (has no administration in time range)  doxycycline (VIBRA-TABS) tablet 100 mg (100 mg Oral Given 02/17/23 2301)  DULoxetine (CYMBALTA) DR capsule 90 mg (has no administration in time range)  OLANZapine (ZYPREXA) tablet 2.5 mg (2.5 mg Oral Given 02/17/23 2301)  traZODone (DESYREL) tablet 150 mg (has no administration in  time range)  pantoprazole (PROTONIX) EC tablet 40 mg (has no administration in time range)  polyethylene glycol (MIRALAX / GLYCOLAX) packet 17 g (has no administration in time range)  finasteride (PROSCAR) tablet 5 mg (has no administration in time range)  tamsulosin (FLOMAX) capsule 0.4 mg (has no administration in time range)  ferrous sulfate tablet 325 mg (has no administration in time range)  melatonin tablet 5 mg (5 mg Oral Given 02/17/23 2301)  gabapentin (NEURONTIN) capsule 300 mg (300 mg Oral Given 02/17/23 2301)  dorzolamide-timolol (COSOPT) 2-0.5 % ophthalmic solution 1 drop (has no administration in time range)  diclofenac Sodium (VOLTAREN) 1 % topical gel 2 g (has no administration in time range)  latanoprost (XALATAN) 0.005 % ophthalmic  solution 1 drop (has no administration in time range)  sodium chloride flush (NS) 0.9 % injection 3 mL (has no administration in time range)  enoxaparin (LOVENOX) injection 40 mg (has no administration in time range)  0.9 %  sodium chloride infusion ( Intravenous New Bag/Given 02/17/23 2317)  acetaminophen (TYLENOL) tablet 650 mg (has no administration in time range)    Or  acetaminophen (TYLENOL) suppository 650 mg (has no administration in time range)  ondansetron (ZOFRAN) tablet 4 mg (has no administration in time range)    Or  ondansetron (ZOFRAN) injection 4 mg (has no administration in time range)  albuterol (PROVENTIL) (2.5 MG/3ML) 0.083% nebulizer solution 2.5 mg (has no administration in time range)  iohexol (OMNIPAQUE) 350 MG/ML injection 150 mL (has no administration in time range)  sodium chloride 0.9 % bolus 500 mL (0 mLs Intravenous Stopped 02/17/23 2249)  morphine (PF) 4 MG/ML injection 4 mg (4 mg Intravenous Given 02/17/23 2018)  sodium chloride 0.9 % bolus 1,000 mL (1,000 mLs Intravenous New Bag/Given 02/17/23 2116)  fentaNYL (SUBLIMAZE) injection 50 mcg (50 mcg Intravenous Given 02/17/23 2239)  sodium chloride 0.9 % bolus 250 mL (250  mLs Intravenous New Bag/Given 02/17/23 2314)     IMPRESSION / MDM / ASSESSMENT AND PLAN / ED COURSE  I reviewed the triage vital signs and the nursing notes.  88 year old male with PMH as noted above presents with acute onset of generalized weakness and some confusion today.  On exam the patient is alert and oriented.  He reports neck pain but has a nonfocal neurologic exam.  He was also found to be hypoxic on room air.  Differential diagnosis includes, but is not limited to, pneumonia, COVID, UTI, other acute infection/sepsis, dehydration, electrolyte abnormality, AKI, other metabolic cause, less likely primary cardiac etiology.  There is no evidence of new fracture or trauma to the cervical spine.  We will obtain chest x-ray, lab workup, give a fluid bolus, and reassess.  Patient's presentation is most consistent with acute presentation with potential threat to life or bodily function.  The patient is on the cardiac monitor to evaluate for evidence of arrhythmia and/or significant heart rate changes.  ----------------------------------------- 7:58 PM on 02/17/2023 -----------------------------------------  The patient has had several runs of ventricular tachycardia with a rate around 120-130 sitting for more than 10 to 15 seconds.  We will start him on an amiodarone infusion.  ----------------------------------------- 10:20 PM on 02/17/2023 -----------------------------------------  The patient started on the amiodarone infusion.  His rhythm went back mostly to sinus with only very brief runs of V. tach.  Blood pressure subsequently dropped to the 70s systolic.  He received a fluid bolus and the blood pressure rapidly improved.  It is now stable.  His heart rate is stable as well.  The patient appears much more comfortable.  Lab workup is overall reassuring.  Troponin is negative.  There are no significant electrolyte abnormalities on the BMP.  Lactate is normal.  The patient will need  inpatient admission for further workup and monitoring.  I consulted Dr. Para March from the hospitalist service; based on her discussion she agrees to evaluate the patient for admission.   FINAL CLINICAL IMPRESSION(S) / ED DIAGNOSES   Final diagnoses:  Generalized weakness  Ventricular tachycardia (HCC)     Rx / DC Orders   ED Discharge Orders     None        Note:  This document was prepared using Dragon voice recognition software and may include  unintentional dictation errors.    Dionne Bucy, MD 02/17/23 2337

## 2023-02-17 NOTE — Assessment & Plan Note (Signed)
Patient noted to be anemic with hemoglobin 8.6 Review of records reveals that he had significant bleeding from left forearm lesion prior to resection Will get serial H&H and transfuse if necessary Continue home iron    Latest Ref Rng & Units 02/17/2023    8:33 PM 02/15/2023    6:50 AM 01/08/2023   11:38 AM  CBC  WBC 4.0 - 10.5 K/uL 5.2  4.1  3.8   Hemoglobin 13.0 - 17.0 g/dL 8.6  9.8  16.1   Hematocrit 39.0 - 52.0 % 29.0  31.4  39.3   Platelets 150 - 400 K/uL 137  153  155

## 2023-02-17 NOTE — Assessment & Plan Note (Addendum)
PICC line in place for venous access from recent procedure on 8/29 Patient had PICC line placed due to poor venous access related to c-collar based on review of notes Will keep PICC line for now No acute issues related to wound suspected Wound care consult Continue outpatient doxycycline

## 2023-02-17 NOTE — Assessment & Plan Note (Signed)
Continue c-collar

## 2023-02-17 NOTE — Assessment & Plan Note (Signed)
Not acutely exacerbated ?Continue home inhalers with DuoNebs as needed ?

## 2023-02-17 NOTE — ED Notes (Signed)
Pt placed on 2 liters NC at this time.

## 2023-02-17 NOTE — ED Notes (Signed)
MD aware of hypotension. Fluid bolus ordered and started . Pt alert and oriented

## 2023-02-17 NOTE — Assessment & Plan Note (Signed)
Clinically euvolemic Holding olmesartan and Bumex overnight due to hypertensive response to Amio Continuing Coreg Daily weights given IV fluid hydration

## 2023-02-17 NOTE — Assessment & Plan Note (Addendum)
Patient briefly hypotensive following amiodarone, responding to fluid bolus Will continue IV fluids for now until stable Monitor for fluid overload in view of history of diastolic CHF Hold amlodipine olmesartan and bumetanide Will continue Coreg

## 2023-02-17 NOTE — Assessment & Plan Note (Addendum)
Retinitis pigmentosa of both eyes Increase nursing per protocol for blindness Continue eyedrops oxycodone

## 2023-02-17 NOTE — Assessment & Plan Note (Addendum)
Patient with repeated episodes of V. tach in the ED lasting up to 30 seconds Converted to sinus with amiodarone bolus and infusion Post amiodarone infusion EKG showing LBBB First troponin normal will get a second set Potassium 4.2 and mag 2.0 Possible etiology to patient's weakness and hypotension on arrival Cardiology consult Close monitoring in stepdown

## 2023-02-17 NOTE — Assessment & Plan Note (Addendum)
Seen by vascular, Dr. Wyn Quaker on 8/27 who is arranging preoperative clearance for endovascular repair. Last aneurysm size was 5 cm

## 2023-02-17 NOTE — ED Notes (Signed)
First Nurse Note: Pt to ED via [facility] from home for generalized weakness. Pts live in caretaker stated that pt is weaker today than he normally is and that he needs to come to the hospital. Pt was not able to ambulate as well as he normally does. Pt was recently at [facility] and had skin cancer removed. Pt is legally blind. VSS with EMS. Pt is in NAD.

## 2023-02-17 NOTE — H&P (Signed)
History and Physical    Patient: Aaron Gibbs WUJ:[id number] DOB: 29-Nov-1934 DOA: 02/17/2023 DOS: the patient was seen and examined on 02/17/2023 PCP: Aaron Cords, DO  Patient coming from: Home  Chief Complaint:  Chief Complaint  Patient presents with   Weakness    HPI: Aaron Gibbs is a 88 y.o. 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  At baseline patient is able to ambulate but needed assistance.  He has had no nausea, vomiting, abdominal pain, diarrhea or dysuria or cough, fever or chills or shortness of breath.  Denies chest pain, lower extremity pain or swelling. ED course and data review: Briefly hypoxic to 89 with otherwise normal vitals.  CBC and BMP notable for hemoglobin of 8.6 down from 9.82 days ago on the day of resection and 11.7 a month ago.  WBC normal and lactic acid 0.6. Potassium and magnesium normal Lactic acid 0.6 Troponin 15 EKG #1 ectopic atrial rhythm at 67 Chest x-ray showed small right pleural effusion but otherwise unchanged findings from 01/11/2023  While in the emergency room patient went into asymptomatic V. tach with episodes lasting 15 to 30 seconds with reading EKG #2 showed sinus tachycardia at 125 with atrial premature complex and LBBB  Patient was loaded with amiodarone bolus followed by infusion  He had a hypotensive response to Amio with systolic going as low as the 70s but was fluid responsive to 121/51 at the time of admission  Patient subsequently remained  stable Hospitalist consulted for admission.   Review of Systems: As mentioned in the history of present illness. All other systems reviewed and are negative.  Past Medical History:  Diagnosis Date   Anxiety    Arthritis    Cancer (HCC)    skin and melanoma Left forearm   CHF (congestive heart failure) (HCC)    COPD (chronic obstructive pulmonary disease) (HCC)    Depression    Dysrhythmia    Fracture of cervical vertebra, C2 (HCC)    Glaucoma    Hypertension    Osteoporosis    osteopenia   Pneumonia    Suicide attempt (HCC) (05/13/18) 06/26/2018   Past Surgical History:  Procedure Laterality Date   CHOLECYSTECTOMY     COLONOSCOPY     EXCISION MELANOMA WITH SENTINEL LYMPH NODE BIOPSY Left 01/11/2023   Procedure: CAUTERIZATION OF BLEEDING WOUND ON LEFT FOREARM;  Surgeon: Aaron Lint, MD;  Location: [facility];  Service: General;  Laterality: Left;   EXCISION MELANOMA WITH SENTINEL LYMPH NODE BIOPSY N/A 02/15/2023   Procedure: WIDE LOCAL EXCISION LEFT FOREARM MELANOMA SITES WITH SKIN SUBSTITUTE COVERAGE, SENTINEL LYMPH NODE BIOPSY;  Surgeon: Aaron Lint, MD;  Location: [facility];  Service: General;  Laterality: N/A;   TRANSURETHRAL RESECTION OF PROSTATE N/A 2008   Social History:  reports that he has quit smoking. His smoking use included cigarettes. He has quit using smokeless tobacco.  His smokeless tobacco use included chew. He reports that he does not currently use alcohol. He reports that he does not use drugs.  Allergies  Allergen Reactions   Anthralin Shortness Of  Breath   Azithromycin Shortness Of Breath    Family History  Problem Relation Age of Onset   Depression Mother    Heart disease Father     Prior to Admission medications   Medication Sig Start Date End Date Taking? Authorizing Provider  acetaminophen (TYLENOL) 500 MG tablet Take 1,000 mg by mouth in the morning, at noon, and at bedtime.   Yes [provider]  albuterol (VENTOLIN HFA) 108 (90 Base)  MCG/ACT inhaler Inhale 2 puffs into the lungs every 4 (four) hours as needed for wheezing or shortness of breath.   Yes [provider]  amLODipine (NORVASC) 10 MG tablet Take 1 tablet (10 mg total) by mouth daily. 02/02/23  Yes Gibbs, Aaron Neat, DO  bumetanide (BUMEX) 0.5 MG tablet Take 1 tablet (0.5 mg total) by mouth 2 (two) times daily. Patient taking differently: Take 1 mg by mouth 2 (two) times daily. 12/27/22  Yes Gibbs, Aaron Neat, DO  buprenorphine (BUTRANS) 10 MCG/HR PTWK Place 1 patch onto the skin once a week for 28 days. Apply only 1 patch at a time and alternate sites weekly. 12/27/22 02/13/24 Yes Aaron Metz, MD  carvedilol (COREG) 12.5 MG tablet Take 1 tablet (12.5 mg total) by mouth 2 (two) times daily. 12/27/22  Yes Gibbs, Aaron Neat, DO  COSOPT 22.3-6.8 MG/ML ophthalmic solution Place 1 drop into both eyes 2 (two) times daily. 06/25/20  Yes [provider]  diclofenac Sodium (VOLTAREN) 1 % GEL Apply 2 g topically at bedtime. Apply to both knees twice daily Patient taking differently: Apply 2 g topically at bedtime. Apply to both knees at night 02/10/21 10/17/23 Yes Gibbs, Aaron Neat, DO  doxycycline (VIBRA-TABS) 100 MG tablet Take 1 tablet (100 mg total) by mouth 2 (two) times daily. 02/15/23  Yes Aaron Lint, MD  DULoxetine (CYMBALTA) 30 MG capsule Take 3 capsules (90 mg total) by mouth daily. 12/27/22  Yes Gibbs, Aaron Neat, DO  finasteride (PROSCAR) 5 MG tablet Take 1 tablet (5 mg total) by mouth daily. 12/27/22  Yes Gibbs, Aaron Neat, DO  gabapentin (NEURONTIN) 300 MG capsule Take 1 capsule (300 mg total) by mouth 2 (two) times daily. 12/27/22  Yes Gibbs, Aaron Neat, DO  Iron, Ferrous Sulfate, 325 (65 Fe) MG TABS Take 325 mg by mouth daily with supper. 06/06/22  Yes Gibbs, Aaron Neat, DO  latanoprost (XALATAN) 0.005 % ophthalmic solution Place 1 drop into both eyes at bedtime. 02/28/22  Yes [provider]  Melatonin 5 MG CAPS Take 1 capsule (5 mg total) by mouth at bedtime. Patient taking differently: Take 15 mg by mouth at bedtime. 02/03/22  Yes Gibbs, Aaron Neat, DO  meloxicam (MOBIC) 15 MG tablet Take 15 mg by mouth daily. 02/15/23  Yes [provider]  OLANZapine (ZYPREXA) 2.5 MG tablet Take 1 tablet (2.5 mg total) by mouth 2 (two) times daily. 12/27/22  Yes Gibbs, Aaron Neat, DO  olmesartan (BENICAR) 40 MG tablet Take 1 tablet (40 mg total) by mouth daily. 01/30/23  Yes Gibbs, Aaron Neat, DO  oxyCODONE (OXY IR/ROXICODONE) 5 MG immediate release tablet Take 1 tablet (5 mg total) by mouth every 6 (six) hours as needed for severe pain. 02/15/23  Yes Aaron Lint, MD  pantoprazole (PROTONIX) 40 MG tablet Take 1 tablet (40 mg total) by mouth daily before breakfast. 12/27/22  Yes Gibbs, Aaron Neat, DO  polyethylene glycol (MIRALAX / GLYCOLAX) 17 g packet Take 17 g by mouth 2 (two) times daily. 10/30/22  Yes Narda Bonds, MD  tamsulosin (FLOMAX) 0.4 MG CAPS capsule Take 1 capsule (0.4 mg total) by mouth daily. 12/27/22  Yes Gibbs, Aaron Neat, DO  traZODone (DESYREL) 150 MG tablet Take 1 tablet (150 mg total) by mouth at bedtime as needed. 12/27/22  Yes Gibbs, Aaron Neat, DO  buprenorphine (BUTRANS) 10 MCG/HR PTWK Place 1 patch onto the skin once a week for 28 days. Apply only 1 patch at a time and alternate sites weekly. 01/24/23 02/21/23  Aaron Metz, MD  buprenorphine (BUTRANS) 10 MCG/HR PTWK Place 1 patch onto the skin once a week for 28 days. Apply only 1 patch at a time and alternate sites weekly. 02/21/23   Aaron Metz, MD    Physical Exam: Vitals:   02/17/23 1853 02/17/23 1923 02/17/23 2110 02/17/23 2130  BP: (!) 94/58 (!) 106/53 (!) 75/50 (!) 94/45  Pulse: 72 81 (!) 49   Resp: (!) 22 14 15 16   Temp:      TempSrc:      SpO2:      Weight:      Height:       Physical Exam Vitals and nursing note reviewed.   Constitutional:      General: He is sleeping. He is not in acute distress.    Comments: Frail-appearing elderly male  HENT:     Head: Normocephalic and atraumatic.  Neck:     Comments: Soft cervical collar Cardiovascular:     Rate and Rhythm: Normal rate and regular rhythm.     Heart sounds: Normal heart sounds.  Pulmonary:     Effort: Pulmonary effort is normal.     Breath sounds: Normal breath sounds.  Abdominal:     Palpations: Abdomen is soft.     Tenderness: There is no abdominal tenderness.  Musculoskeletal:     Comments: Left forearm in bandage from recent procedure  Neurological:     Mental Status: He is easily aroused. Mental status is at baseline. He is lethargic.     Labs on Admission: I have personally reviewed following labs and imaging studies  CBC: Recent Labs  Lab 02/15/23 0650 02/17/23 2033  WBC 4.1 5.2  HGB 9.8* 8.6*  HCT 31.4* 29.0*  MCV 95.4 97.6  PLT 153 137*   Basic Metabolic Panel: Recent Labs  Lab 02/15/23 0650 02/17/23 2033  NA 138 139  K 4.1 4.2  CL 105 104  CO2 27 26  GLUCOSE 121* 124*  BUN 20 22  CREATININE 1.07 1.18  CALCIUM 8.7* 8.5*  MG  --  2.0   GFR: Estimated Creatinine Clearance: 54.4 mL/min (by C-G formula based on SCr of 1.18 mg/dL). Liver Function Tests: No results for input(s): "AST", "ALT", "ALKPHOS", "BILITOT", "PROT", "ALBUMIN" in the last 168 hours. No results for input(s): "LIPASE", "AMYLASE" in the last 168 hours. No results for input(s): "AMMONIA" in the last 168 hours. Coagulation Profile: No results for input(s): "INR", "PROTIME" in the last 168 hours. Cardiac Enzymes: No results for input(s): "CKTOTAL", "CKMB", "CKMBINDEX", "TROPONINI" in the last 168 hours. BNP (last 3 results) No results for input(s): "PROBNP" in the last 8760 hours. HbA1C: No results for input(s): "HGBA1C" in the last 72 hours. CBG: No results for input(s): "GLUCAP" in the last 168 hours. Lipid Profile: No results for input(s):  "CHOL", "HDL", "LDLCALC", "TRIG", "CHOLHDL", "LDLDIRECT" in the last 72 hours. Thyroid Function Tests: No results for input(s): "TSH", "T4TOTAL", "FREET4", "T3FREE", "THYROIDAB" in the last 72 hours. Anemia Panel: No results for  input(s): "VITAMINB12", "FOLATE", "FERRITIN", "TIBC", "IRON", "RETICCTPCT" in the last 72 hours. Urine analysis:    Component Value Date/Time   COLORURINE AMBER (A) 10/17/2022 0153   APPEARANCEUR HAZY (A) 10/17/2022 0153   LABSPEC 1.026 10/17/2022 0153   PHURINE 5.0 10/17/2022 0153   GLUCOSEU NEGATIVE 10/17/2022 0153   HGBUR NEGATIVE 10/17/2022 0153   BILIRUBINUR NEGATIVE 10/17/2022 0153   BILIRUBINUR Negative 07/12/2020 1348   KETONESUR NEGATIVE 10/17/2022 0153   PROTEINUR 100 (A) 10/17/2022 0153   UROBILINOGEN 0.2 07/12/2020 1348   NITRITE NEGATIVE 10/17/2022 0153   LEUKOCYTESUR NEGATIVE 10/17/2022 0153    Radiological Exams on Admission: DG Chest Port 1 View  Result Date: 02/17/2023 CLINICAL DATA:  Hypoxia. EXAM: PORTABLE CHEST 1 VIEW COMPARISON:  Chest radiograph dated 01/11/2023. FINDINGS: The heart size and mediastinal contours are within normal limits. There is mild right basilar atelectasis/airspace disease. A small right pleural effusion may contribute. Left interstitial and airspace opacities in the left mid lung appear similar to prior exam and may reflect an incompletely resolved cavitary lesion. No left pleural effusion. No pneumothorax on either side. A right upper extremity peripherally inserted central venous catheter tip overlies the superior cavoatrial junction. Degenerative changes are seen in the spine. IMPRESSION: 1. Mild right basilar atelectasis/airspace disease. A small right pleural effusion may contribute. 2. Left interstitial and airspace opacities in the left mid lung appear similar to prior exam and may reflect an incompletely resolved cavitary lesion. Electronically Signed   By: Romona Curls M.D.   On: 02/17/2023 20:08     Data  Reviewed: Relevant notes from primary care and specialist visits, past discharge summaries as available in EHR, including Care Everywhere. Prior diagnostic testing as pertinent to current admission diagnoses Updated medications and problem lists for reconciliation ED course, including vitals, labs, imaging, treatment and response to treatment Triage notes, nursing and pharmacy notes and ED provider's notes Notable results as noted in HPI   Assessment and Plan: * NSVT (nonsustained ventricular tachycardia) (HCC) Patient with repeated episodes of V. tach in the ED lasting up to 30 seconds Converted to sinus with amiodarone bolus and infusion Post amiodarone infusion EKG showing LBBB First troponin normal will get a second set Potassium 4.2 and mag 2.0 Possible etiology to patient's weakness and hypotension on arrival Cardiology consult Close monitoring in stepdown  Hypotension Patient briefly hypotensive following amiodarone, responding to fluid bolus Will continue IV fluids for now until stable Monitor for fluid overload in view of history of diastolic CHF Hold amlodipine olmesartan and bumetanide Will continue Coreg  Generalized weakness Etiology suspect related to arrhythmia No stigmata of infection Noted to be anemic which could be primary etiology Neurologic checks, continuous cardiac monitoring Continue workup on the each respective problem Will get blood cultures from PICC line though no obvious source of infection as well as urinalysis D-dimer returned elevated at 1.28 so will get CTA chest  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 (HCC) PICC line in place for venous access from recent procedure on 8/29 Patient had PICC line placed due to poor venous access related to c-collar based on review of notes Will keep PICC line for now No acute issues related to wound suspected Wound care consult Continue outpatient  doxycycline  Anemia Patient noted to be anemic with hemoglobin 8.6 Review of records reveals that he had significant bleeding from left forearm lesion prior to resection Will get serial H&H and transfuse if necessary Continue home iron  Latest Ref Rng & Units 02/17/2023    8:33 PM 02/15/2023    6:50 AM 01/08/2023   11:38 AM  CBC  WBC 4.0 - 10.5 K/uL 5.2  4.1  3.8   Hemoglobin 13.0 - 17.0 g/dL 8.6  9.8  16.1   Hematocrit 39.0 - 52.0 % 29.0  31.4  39.3   Platelets 150 - 400 K/uL 137  153  155      Traumatic closed fracture of C2 vertebra with minimal displacement, sequela Continue c-collar  Infrarenal abdominal aortic aneurysm (AAA) without rupture (HCC) Seen by vascular, Dr. Wyn Quaker on 8/27 who is arranging preoperative clearance for endovascular repair. Last aneurysm size was 5 cm  Chronic heart failure with preserved ejection fraction (HCC) Clinically euvolemic Holding olmesartan and Bumex overnight due to hypertensive response to Amio Continuing Coreg Daily weights given IV fluid hydration  Depression with anxiety Continue trazodone and duloxetine and Zyprexa  COPD (chronic obstructive pulmonary disease) (HCC) Not acutely exacerbated Continue home inhalers with DuoNebs as needed  Long term current use of opiate analgesic Continue Butrans patch, Voltaren gel, oxycodone  Blindness Retinitis pigmentosa of both eyes Increase nursing per protocol for blindness Continue eyedrops oxycodone     DVT prophylaxis: Lovenox  Consults: [facility] cardiology  Advance Care Planning:   Code Status: Prior   Family Communication: Caregiver at bedside  Disposition Plan: Back to previous home environment  Severity of Illness: The appropriate patient status for this patient is INPATIENT. Inpatient status is judged to be reasonable and necessary in order to provide the required intensity of service to ensure the patient's safety. The patient's presenting symptoms, physical exam  findings, and initial radiographic and laboratory data in the context of their chronic comorbidities is felt to place them at high risk for further clinical deterioration. Furthermore, it is not anticipated that the patient will be medically stable for discharge from the hospital within 2 midnights of admission.   * I certify that at the point of admission it is my clinical judgment that the patient will require inpatient hospital care spanning beyond 2 midnights from the point of admission due to high intensity of service, high risk for further deterioration and high frequency of surveillance required.*  Author: Andris Baumann, MD 02/17/2023 10:28 PM  For on call review [email or web address].

## 2023-02-17 NOTE — Assessment & Plan Note (Addendum)
Continue Butrans patch, Voltaren gel, oxycodone

## 2023-02-17 NOTE — Assessment & Plan Note (Addendum)
Etiology suspect related to arrhythmia No stigmata of infection Noted to be anemic which could be primary etiology Neurologic checks, continuous cardiac monitoring Continue workup on the each respective problem Will get blood cultures from PICC line though no obvious source of infection as well as urinalysis D-dimer returned elevated at 1.28 so will get CTA chest

## 2023-02-18 ENCOUNTER — Inpatient Hospital Stay (HOSPITAL_COMMUNITY)
Admit: 2023-02-18 | Discharge: 2023-02-18 | Disposition: A | Discharge status: Home / Self Care | Payer: Medicare Other | Attending: Internal Medicine | Admitting: Internal Medicine

## 2023-02-18 ENCOUNTER — Encounter: Payer: Self-pay | Admitting: Internal Medicine

## 2023-02-18 DIAGNOSIS — R55 Syncope and collapse: Secondary | ICD-10-CM

## 2023-02-18 DIAGNOSIS — I4729 Other ventricular tachycardia: Secondary | ICD-10-CM

## 2023-02-18 DIAGNOSIS — I950 Idiopathic hypotension: Secondary | ICD-10-CM | POA: Diagnosis not present

## 2023-02-18 DIAGNOSIS — I5032 Chronic diastolic (congestive) heart failure: Secondary | ICD-10-CM

## 2023-02-18 DIAGNOSIS — I7143 Infrarenal abdominal aortic aneurysm, without rupture: Secondary | ICD-10-CM

## 2023-02-18 LAB — CBC
HCT: 31.2 % — ABNORMAL LOW (ref 39.0–52.0)
Hemoglobin: 9.1 g/dL — ABNORMAL LOW (ref 13.0–17.0)
MCH: 29.2 pg (ref 26.0–34.0)
MCHC: 29.2 g/dL — ABNORMAL LOW (ref 30.0–36.0)
MCV: 100 fL (ref 80.0–100.0)
Platelets: 122 10*3/uL — ABNORMAL LOW (ref 150–400)
RBC: 3.12 MIL/uL — ABNORMAL LOW (ref 4.22–5.81)
RDW: 15.9 % — ABNORMAL HIGH (ref 11.5–15.5)
WBC: 6.3 10*3/uL (ref 4.0–10.5)
nRBC: 0 % (ref 0.0–0.2)

## 2023-02-18 LAB — MAGNESIUM: Magnesium: 2 mg/dL (ref 1.7–2.4)

## 2023-02-18 LAB — PROCALCITONIN: Procalcitonin: <0.1 ng/mL

## 2023-02-18 LAB — URINALYSIS, ROUTINE W REFLEX MICROSCOPIC
Bilirubin Urine: NEGATIVE
Glucose, UA: NEGATIVE mg/dL
Hgb urine dipstick: NEGATIVE
Ketones, ur: NEGATIVE mg/dL
Leukocytes,Ua: NEGATIVE
Nitrite: NEGATIVE
Protein, ur: NEGATIVE mg/dL
Specific Gravity, Urine: 1.015 (ref 1.005–1.030)
pH: 5.5 (ref 5.0–8.0)

## 2023-02-18 LAB — TSH: TSH: 1.297 u[IU]/mL (ref 0.350–4.500)

## 2023-02-18 LAB — BASIC METABOLIC PANEL
Anion gap: 8 (ref 5–15)
BUN: 21 mg/dL (ref 8–23)
CO2: 25 mmol/L (ref 22–32)
Calcium: 8.3 mg/dL — ABNORMAL LOW (ref 8.9–10.3)
Chloride: 107 mmol/L (ref 98–111)
Creatinine, Ser: 1.08 mg/dL (ref 0.61–1.24)
GFR, Estimated: >60 mL/min (ref >60)
Glucose, Bld: 123 mg/dL — ABNORMAL HIGH (ref 70–99)
Potassium: 4.5 mmol/L (ref 3.5–5.1)
Sodium: 140 mmol/L (ref 135–145)

## 2023-02-18 LAB — MRSA NEXT GEN BY PCR, NASAL: MRSA by PCR Next Gen: DETECTED — AB

## 2023-02-18 MED ORDER — MORPHINE SULFATE (PF) 2 MG/ML IV SOLN
2.0000 mg | Freq: Once | INTRAVENOUS | Status: AC
  Administered 2023-02-18: 2 mg via INTRAVENOUS
  Filled 2023-02-18: qty 1

## 2023-02-18 MED ORDER — SODIUM CHLORIDE 0.9 % IV BOLUS
1000.0000 mL | Freq: Once | INTRAVENOUS | Status: AC
  Administered 2023-02-18: 1000 mL via INTRAVENOUS

## 2023-02-18 MED ORDER — CHLORHEXIDINE GLUCONATE CLOTH 2 % EX PADS
6.0000 | MEDICATED_PAD | Freq: Every day | CUTANEOUS | Status: DC
  Administered 2023-02-18: 6 via TOPICAL

## 2023-02-18 MED ORDER — MUPIROCIN 2 % EX OINT
1.0000 | TOPICAL_OINTMENT | Freq: Two times a day (BID) | CUTANEOUS | Status: DC
  Administered 2023-02-18 – 2023-02-22 (×9): 1 via NASAL
  Filled 2023-02-18: qty 22

## 2023-02-18 MED ORDER — CARVEDILOL 3.125 MG PO TABS
3.1250 mg | ORAL_TABLET | Freq: Two times a day (BID) | ORAL | Status: DC
  Administered 2023-02-18 – 2023-02-21 (×6): 3.125 mg via ORAL
  Filled 2023-02-18 (×6): qty 1

## 2023-02-18 NOTE — Progress Notes (Signed)
  Echocardiogram 2D Echocardiogram has been performed.  Aaron Gibbs 02/18/2023, 5:33 PM

## 2023-02-18 NOTE — Consult Note (Signed)
Cardiology Consultation   Patient ID: Aaron Gibbs MRN: [id number]; DOB: [birth year]  Admit date: 02/17/2023 Date of Consult: 02/18/2023  PCP:  Smitty Cords, DO   [facility] Providers Cardiologist:  None      New consult completed by Dr Duke Salvia  Patient Profile:   Aaron Gibbs is a 88 y.o. 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 Para Gibbs.  History of Present Illness:   Aaron Gibbs presents to the [facility] emergency department on 02/07/2023 via EMS with a complaint of weakness from his caregiver in the home.  At his baseline the patient is able to ambulate with needed assistance as he is legally blind.  His caregiver stated that his symptoms came on all of a sudden he was extremely weak unable to transfer from the chair to the toilet or to stand to ambulate around the home.  He also had no appetite which was very unusual for him.  He complains of chronic pain primarily to his neck where he has a c-collar on.  He states that his primary complaint was currently neck pain which has been chronic since the fracture happened but is worsened in the last few days.  Denied any new trauma to the neck.  He denied any chest pain, palpitations, shortness of breath, nausea, chills vomiting or diarrhea.  He continued to have PICC line in place for IV access that his caregiver states he is not getting any medication through.  Stating he was placed on oral antibiotic therapy after his procedure was completed at the time of the excision of the cancer from his forearm.  Unfortunately further into evaluation in the emergency department is not  patient had several runs of ventricular tachycardia with a rate around [birth year]1928 for more than 10 to 15 seconds he was started on amiodarone in fusion for his brief runs of V. tach.  Blood pressure subsequently dropped into the 70s systolic and he received a fluid bolus and his blood pressure rapidly improved and remained stable throughout the remainder of his visit.  He was continued on amiodarone infusion and the hospitalist was called for admission  Initial vital signs: Blood pressure 154/71, pulse of 67, respirations of 18, temperature of 97.7  Pertinent labs: Blood glucose 124, calcium 8.5, hemoglobin 8.6, hematocrit 29.0, platelets of 137, D-dimer of 1.28, high-sensitivity troponin negative x 2, COVID by PCR was negative  Imaging: Chest x-ray revealed mild right basilar atelectasis/airspace disease small right pleural effusion, left interstitial and airspace opacities in the left midlung appears similar to prior exam may reflect an incompletely resolved cavitary lesion; CT of the chest was negative for acute pulmonary embolism, infrarenal abdominal aortic aneurysm again measuring 5.0 cm at greatest diameter recommend follow-up CT/MRI every 6 months and vascular consultation if not obtained, small right pleural effusion with associated atelectasis, loosely organized left axillary postprocedural hematoma/seroma, 1.4 cm cystic lesion in the posterior right kidney  Medications administered in the emergency department: Amiodarone bolus plus drip, morphine 4 mg IVP, 1 L normal saline bolus, 50 mcg of fentanyl IVP, and an additional 250 mL's of normal saline  Cardiology was consulted for transient ventricular tachycardia controlled with amiodarone infusion  Past Medical History:  Diagnosis Date   Anxiety    Arthritis    Cancer (HCC)    skin and melanoma Left forearm   CHF (congestive heart failure) (HCC)    COPD (chronic obstructive pulmonary disease) (HCC)    Depression    Dysrhythmia     Fracture of cervical vertebra, C2 (HCC)    Glaucoma    Hypertension    Osteoporosis    osteopenia   Pneumonia    Suicide attempt (HCC) (05/13/18) 06/26/2018    Past Surgical History:  Procedure Laterality Date   CHOLECYSTECTOMY     COLONOSCOPY     EXCISION MELANOMA WITH SENTINEL LYMPH NODE BIOPSY Left 01/11/2023   Procedure: CAUTERIZATION OF BLEEDING WOUND ON LEFT FOREARM;  Surgeon: [name], MD;  Location: [facility] ORS;  Service: General;  Laterality: Left;   EXCISION MELANOMA WITH SENTINEL LYMPH NODE BIOPSY N/A 02/15/2023   Procedure: WIDE LOCAL EXCISION LEFT FOREARM MELANOMA SITES WITH SKIN SUBSTITUTE COVERAGE, SENTINEL LYMPH NODE BIOPSY;  Surgeon: [name], MD;  Location: [facility] OR;  Service: General;  Laterality: N/A;   TRANSURETHRAL RESECTION OF PROSTATE N/A 2008     Home Medications:  Prior to Admission medications   Medication Sig Start Date End Date Taking? Authorizing Provider  acetaminophen (TYLENOL) 500 MG tablet Take 1,000 mg by mouth in the morning, at noon, and at bedtime.   Yes [provider]  albuterol (VENTOLIN HFA) 108 (90 Base) MCG/ACT inhaler Inhale 2 puffs into the lungs every 4 (four) hours as needed for wheezing or shortness of breath.   Yes [provider]  amLODipine (NORVASC) 10 MG tablet Take 1 tablet (10 mg total) by mouth daily. 02/02/23  Yes Karamalegos, Netta Neat, DO  bumetanide (BUMEX) 0.5 MG tablet Take 1 tablet (0.5 mg total) by mouth 2 (two) times daily. Patient taking differently: Take 1 mg by mouth 2 (two) times daily. 12/27/22  Yes Karamalegos, Netta Neat, DO  buprenorphine (BUTRANS) 10 MCG/HR PTWK Place 1 patch onto the skin once a week for 28 days. Apply only 1 patch at a time and alternate sites weekly. 12/27/22 02/13/24 Yes Delano Metz, MD  carvedilol (COREG) 12.5 MG tablet Take 1 tablet (12.5 mg total) by mouth 2 (two) times daily. 12/27/22  Yes Karamalegos, Netta Neat, DO  COSOPT 22.3-6.8 MG/ML ophthalmic solution Place  1 drop into both eyes 2 (two) times daily. 06/25/20  Yes [provider]  diclofenac Sodium (VOLTAREN) 1 % GEL Apply 2 g topically at bedtime. Apply to both knees twice daily Patient taking differently: Apply 2 g topically at bedtime. Apply to both knees at night 02/10/21 10/17/23 Yes Karamalegos, Netta Neat, DO  doxycycline (VIBRA-TABS) 100 MG tablet Take 1 tablet (100 mg total) by mouth 2 (two) times daily. 02/15/23  Yes [name], MD  DULoxetine (CYMBALTA) 30 MG capsule Take 3 capsules (90 mg total) by mouth daily. 12/27/22  Yes Karamalegos, Netta Neat, DO  finasteride (PROSCAR) 5 MG tablet Take 1 tablet (5 mg total) by mouth daily. 12/27/22  Yes Karamalegos, Netta Neat, DO  gabapentin (NEURONTIN) 300 MG capsule Take 1 capsule (300 mg total) by mouth 2 (two) times daily. 12/27/22  Yes Karamalegos, Netta Neat, DO  Iron, Ferrous Sulfate, 325 (65 Fe) MG TABS Take 325 mg by mouth daily with supper. 06/06/22  Yes Karamalegos, Netta Neat, DO  latanoprost (XALATAN) 0.005 % ophthalmic solution Place 1 drop into both eyes at bedtime. 02/28/22  Yes [provider]  Melatonin 5 MG  CAPS Take 1 capsule (5 mg total) by mouth at bedtime. Patient taking differently: Take 15 mg by mouth at bedtime. 02/03/22  Yes Karamalegos, Netta Neat, DO  meloxicam (MOBIC) 15 MG tablet Take 15 mg by mouth daily. 02/15/23  Yes [provider]  OLANZapine (ZYPREXA) 2.5 MG tablet Take 1 tablet (2.5 mg total) by mouth 2 (two) times daily. 12/27/22  Yes Karamalegos, Netta Neat, DO  olmesartan (BENICAR) 40 MG tablet Take 1 tablet (40 mg total) by mouth daily. 01/30/23  Yes Karamalegos, Netta Neat, DO  oxyCODONE (OXY IR/ROXICODONE) 5 MG immediate release tablet Take 1 tablet (5 mg total) by mouth every 6 (six) hours as needed for severe pain. 02/15/23  Yes [name], MD  pantoprazole (PROTONIX) 40 MG tablet Take 1 tablet (40 mg total) by mouth daily before breakfast. 12/27/22  Yes Karamalegos, Netta Neat, DO   polyethylene glycol (MIRALAX / GLYCOLAX) 17 g packet Take 17 g by mouth 2 (two) times daily. 10/30/22  Yes Narda Bonds, MD  tamsulosin (FLOMAX) 0.4 MG CAPS capsule Take 1 capsule (0.4 mg total) by mouth daily. 12/27/22  Yes Karamalegos, Netta Neat, DO  traZODone (DESYREL) 150 MG tablet Take 1 tablet (150 mg total) by mouth at bedtime as needed. 12/27/22  Yes Karamalegos, Netta Neat, DO  buprenorphine (BUTRANS) 10 MCG/HR PTWK Place 1 patch onto the skin once a week for 28 days. Apply only 1 patch at a time and alternate sites weekly. 01/24/23 02/21/23  Delano Metz, MD  buprenorphine (BUTRANS) 10 MCG/HR PTWK Place 1 patch onto the skin once a week for 28 days. Apply only 1 patch at a time and alternate sites weekly. 02/21/23   Delano Metz, MD    Inpatient Medications: Scheduled Meds:  buprenorphine  1 patch Transdermal Weekly   Chlorhexidine Gluconate Cloth  6 each Topical Daily   diclofenac Sodium  2 g Topical QHS   dorzolamide-timolol  1 drop Both Eyes BID   doxycycline  100 mg Oral BID   DULoxetine  90 mg Oral Daily   enoxaparin (LOVENOX) injection  40 mg Subcutaneous Q24H   ferrous sulfate  325 mg Oral Q supper   finasteride  5 mg Oral Daily   gabapentin  300 mg Oral BID   latanoprost  1 drop Both Eyes QHS   melatonin  5 mg Oral QHS   OLANZapine  2.5 mg Oral BID   pantoprazole  40 mg Oral QAC breakfast   polyethylene glycol  17 g Oral BID   sodium chloride flush  3 mL Intravenous Q12H   tamsulosin  0.4 mg Oral Daily   Continuous Infusions:  amiodarone 30 mg/hr (02/18/23 1043)   PRN Meds: acetaminophen **OR** acetaminophen, albuterol, ondansetron **OR** ondansetron (ZOFRAN) IV, oxyCODONE, traZODone  Allergies:    Allergies  Allergen Reactions   Anthralin Shortness Of Breath   Azithromycin Shortness Of Breath    Social History:   Social History   Socioeconomic History   Marital status: Divorced    Spouse name: Not on file   Number of children: Not on  file   Years of education: Not on file   Highest education level: Not on file  Occupational History   Occupation: retired  Tobacco Use   Smoking status: Former    Types: Cigarettes   Smokeless tobacco: Former    Types: Chew   Tobacco comments:    stopped 15 years ago  Vaping Use   Vaping status: Never Used  Substance and Sexual Activity  Alcohol use: Not Currently    Comment: past   Drug use: Never   Sexual activity: Not Currently  Other Topics Concern   Not on file  Social History Narrative   Not on file   Social Determinants of Health   Financial Resource Strain: Low Risk  (07/13/2022)   Overall Financial Resource Strain (CARDIA)    Difficulty of Paying Living Expenses: Not hard at all  Food Insecurity: No Food Insecurity (10/11/2022)   Hunger Vital Sign    Worried About Running Out of Food in the Last Year: Never true    Ran Out of Food in the Last Year: Never true  Transportation Needs: No Transportation Needs (10/11/2022)   PRAPARE - Administrator, Civil Service (Medical): No    Lack of Transportation (Non-Medical): No  Physical Activity: Inactive (07/13/2022)   Exercise Vital Sign    Days of Exercise per Week: 0 days    Minutes of Exercise per Session: 0 min  Stress: Stress Concern Present (07/13/2022)   [employer] of Occupational Health - Occupational Stress Questionnaire    Feeling of Stress : Rather much  Social Connections: Socially Isolated (07/13/2022)   Social Connection and Isolation Panel [NHANES]    Frequency of Communication with Friends and Family: More than three times a week    Frequency of Social Gatherings with Friends and Family: Twice a week    Attends Religious Services: Never    Database administrator or Organizations: No    Attends Banker Meetings: Never    Marital Status: Divorced  Catering manager Violence: Not At Risk (10/11/2022)   Humiliation, Afraid, Rape, and Kick questionnaire    Fear of Current or  Ex-Partner: No    Emotionally Abused: No    Physically Abused: No    Sexually Abused: No    Family History:     Family History  Problem Relation Age of Onset   Depression Mother    Heart disease Father      ROS:  Please see the history of present illness.  Review of Systems  Constitutional:  Positive for malaise/fatigue.  Musculoskeletal:  Positive for neck pain.  Neurological:  Positive for weakness.    All other ROS reviewed and negative.     Physical Exam/Data:   Vitals:   02/18/23 0800 02/18/23 0830 02/18/23 0900 02/18/23 1000  BP: (!) 115/52 120/61 119/69 107/67  Pulse: 62  67 68  Resp: 19 18 13  (!) 21  Temp:  (!) 97.5 F (36.4 C)    TempSrc:  Oral    SpO2:  90% 93% 93%  Weight:      Height:        Intake/Output Summary (Last 24 hours) at 02/18/2023 1152 Last data filed at 02/18/2023 1000 Gross per 24 hour  Intake 3119.86 ml  Output --  Net 3119.86 ml      02/17/2023    6:09 PM 02/15/2023    6:50 AM 02/13/2023    1:43 PM  Last 3 Weights  Weight (lbs) 213 lb 13.5 oz 214 lb 214 lb  Weight (kg) 97 kg 97.07 kg 97.07 kg     Body mass index is 29.83 kg/m.  General: Frail, chronically ill-appearing, in no acute distress HEENT: normal Neck: Unable to assess JVD due to patient currently with c-collar on Vascular: No carotid bruits; Distal pulses 2+ bilaterally Cardiac:  normal S1, S2; RRR; no murmur  Lungs:  clear upper lobes with slightly diminished bases  to auscultation bilaterally, no wheezing, rhonchi or rales, rations are unlabored at rest currently on 4 L of O2 via nasal cannula Abd: soft, nontender, no hepatomegaly  Ext: no edema Musculoskeletal:  No deformities, BUE and BLE strength normal and equal Skin: warm and dry, left forearm remained stress from recent procedure Neuro:  CNs 2-12 intact, no focal abnormalities noted Psych:  Flat affect   EKG:  The EKG was personally reviewed and demonstrates:  EKG #1 sinus rhythm with a rate of 67 with a large  quantity of artifact, EKG #2 showed wide-complex tachycardia with rate 125 and a left bundle branch block Telemetry:  Telemetry was personally reviewed and demonstrates: Sinus rhythm with first-degree AV block and PACs  Relevant CV Studies: TTE 01/2019 1. The left ventricle has normal systolic function with an ejection  fraction of 60-65%. The cavity size was normal. Left ventricular diastolic  Doppler parameters are consistent with impaired relaxation.   2. The right ventricle has normal systolic function. The cavity was  normal. There is no increase in right ventricular wall thickness.Unable to  estimate RVSP.   TTE 05/2018 Left ventricle: The cavity size was normal. Systolic function was    normal. The estimated ejection fraction was in the range of 60%    to 65%. Wall motion was normal; there were no regional wall    motion abnormalities. Doppler parameters are consistent with    abnormal left ventricular relaxation (grade 1 diastolic    dysfunction).  - Left atrium: The atrium was normal in size.  - Right ventricle: Systolic function was normal.  - Pulmonary arteries: Systolic pressure could not be accurately    estimated.    Laboratory Data:  High Sensitivity Troponin:   Recent Labs  Lab 02/17/23 1837 02/17/23 2033  TROPONINIHS 14 15     Chemistry Recent Labs  Lab 02/15/23 0650 02/17/23 2033 02/18/23 1002  NA 138 139 140  K 4.1 4.2 4.5  CL 105 104 107  CO2 27 26 25   GLUCOSE 121* 124* 123*  BUN 20 22 21   CREATININE 1.07 1.18 1.08  CALCIUM 8.7* 8.5* 8.3*  MG  --  2.0 2.0  GFRNONAA >60 >60 >60  ANIONGAP 6 9 8     No results for input(s): "PROT", "ALBUMIN", "AST", "ALT", "ALKPHOS", "BILITOT" in the last 168 hours. Lipids No results for input(s): "CHOL", "TRIG", "HDL", "LABVLDL", "LDLCALC", "CHOLHDL" in the last 168 hours.  Hematology Recent Labs  Lab 02/15/23 0650 02/17/23 2033 02/18/23 1002  WBC 4.1 5.2 6.3  RBC 3.29* 2.97* 3.12*  HGB 9.8* 8.6* 9.1*   HCT 31.4* 29.0* 31.2*  MCV 95.4 97.6 100.0  MCH 29.8 29.0 29.2  MCHC 31.2 29.7* 29.2*  RDW 15.9* 16.0* 15.9*  PLT 153 137* 122*   Thyroid No results for input(s): "TSH", "FREET4" in the last 168 hours.  BNPNo results for input(s): "BNP", "PROBNP" in the last 168 hours.  DDimer  Recent Labs  Lab 02/17/23 2034  DDIMER 1.28*     Radiology/Studies:  CT Angio Chest Pulmonary Embolism (PE) W or WO Contrast  Result Date: 02/18/2023 CLINICAL DATA:  Generalized weakness. Unable to ambulate as well as normal. Melanoma excision with sentinel lymph node biopsy on the left on 02/15/2023. PE suspected. Abdominal aortic aneurysm follow-up. EXAM: CT ANGIOGRAPHY CHEST, ABDOMEN AND PELVIS TECHNIQUE: Non-contrast CT of the chest was initially obtained. Multidetector CT imaging through the chest, abdomen and pelvis was performed using the standard protocol during bolus administration of intravenous contrast. Multiplanar  reconstructed images and MIPs were obtained and reviewed to evaluate the vascular anatomy. RADIATION DOSE REDUCTION: This exam was performed according to the departmental dose-optimization program which includes automated exposure control, adjustment of the mA and/or kV according to patient size and/or use of iterative reconstruction technique. CONTRAST:  OMNIPAQUE IOHEXOL 350 MG/ML SOLN COMPARISON:  Chest radiograph 02/17/2023; PET/CT 12/28/2022; CT abdomen and pelvis 10/17/2022; CT chest abdomen and pelvis 10/10/2022 FINDINGS: CTA CHEST FINDINGS Cardiovascular: Negative for acute pulmonary embolism. Normal caliber thoracic aorta without dissection. Coronary artery and aortic atherosclerotic calcification. No pericardial effusion. Right PICC tip in the low SVC. Mediastinum/Nodes: No thoracic adenopathy. Unremarkable trachea and esophagus. Lungs/Pleura: Bronchial wall thickening and scattered mucous plugging greatest in the right lower lobe. Bilateral lower lung bronchiolectasis. Scattered  centrilobular and budding tree micro nodules greatest in the lingula. Decreased size of the thick-walled cavitary mass in the lingula compared with PET/CT 12/28/2022. This measures 3.2 x 1.7 cm today, previously 3.2 x 2.5 cm. Small right pleural effusion and associated atelectasis. No pneumothorax. Musculoskeletal: Subcutaneous emphysema in the anterolateral left chest wall. Loosely organized left axillary postprocedural hematoma/seroma measuring 4.9 x 2.2 x 4.6 cm related to recent axillary node biopsy. Thoracic kyphosis. No acute fracture. Fused anterior enthesophytes throughout the cervical and thoracic spine. Review of the MIP images confirms the above findings. CTA ABDOMEN AND PELVIS FINDINGS VASCULAR Aorta: Infrarenal abdominal aortic aneurysm again measuring up to 5.0 cm in greatest diameter. Aortic calcified atherosclerosis. No dissection. Celiac: No hemodynamically significant stenosis, aneurysm, or dissection. SMA: No hemodynamically significant stenosis, aneurysm, or dissection. Renals: No hemodynamically significant stenosis, aneurysm, or dissection. IMA: Patent. Inflow: Calcified atherosclerosis without significant stenosis. No aneurysm or dissection. Veins: No obvious venous abnormality within the limitations of this arterial phase study. Review of the MIP images confirms the above findings. NON-VASCULAR Hepatobiliary: Cholecystectomy. No focal hepatic lesion. Unchanged biliary prominence. Pancreas: Fatty atrophy.  No acute abnormality. Spleen: Unremarkable. Adrenals/Urinary Tract: Stable adrenal glands. No urinary calculi or hydronephrosis. Unchanged 1.4 cm cystic lesion in the posterior right kidney. Unremarkable bladder. Stomach/Bowel: Normal caliber large and small bowel. Colonic diverticulosis without diverticulitis. Stomach is within normal limits. Normal appendix. Lymphatic: No lymphadenopathy. Reproductive: Unremarkable. Other: No free intraperitoneal fluid or air. Musculoskeletal:  Demineralization. Bilateral femoral head AVN. Fused anterior syndesmophytes in the lumbar spine. Review of the MIP images confirms the above findings. IMPRESSION: 1. Negative for acute pulmonary embolism. 2. Infrarenal abdominal aortic aneurysm again measuring up to 5.0 cm in greatest diameter. Recommend follow-up CT/MR every 6 months and vascular consultation if not obtained. This recommendation follows ACR consensus guidelines: White Paper of the ACR Incidental Findings Committee II on Vascular Findings. J Am Coll Radiol 2013; 10:789-794. 3. Decreased size of the cavitary lesion in the lingula since PET/CT 12/28/2022. 4. Pulmonary findings compatible with infectious/inflammatory bronchiolitis. 5. Small right pleural effusion and associated atelectasis. 6. Loosely organized left axillary postprocedural hematoma/seroma measuring 4.9 x 2.2 x 4.6 cm related to recent axillary node biopsy. 7. 1.4 cm cystic lesion in the posterior right kidney. If clinically appropriate, renal mass protocol MRI could be obtained for further evaluation. Aortic Atherosclerosis (ICD10-I70.0). Electronically Signed   By: Minerva Fester M.D.   On: 02/18/2023 01:03   CT Angio Abd/Pel w/ and/or w/o  Result Date: 02/18/2023 CLINICAL DATA:  Generalized weakness. Unable to ambulate as well as normal. Melanoma excision with sentinel lymph node biopsy on the left on 02/15/2023. PE suspected. Abdominal aortic aneurysm follow-up. EXAM: CT ANGIOGRAPHY CHEST, ABDOMEN AND PELVIS  TECHNIQUE: Non-contrast CT of the chest was initially obtained. Multidetector CT imaging through the chest, abdomen and pelvis was performed using the standard protocol during bolus administration of intravenous contrast. Multiplanar reconstructed images and MIPs were obtained and reviewed to evaluate the vascular anatomy. RADIATION DOSE REDUCTION: This exam was performed according to the departmental dose-optimization program which includes automated exposure control,  adjustment of the mA and/or kV according to patient size and/or use of iterative reconstruction technique. CONTRAST:  OMNIPAQUE IOHEXOL 350 MG/ML SOLN COMPARISON:  Chest radiograph 02/17/2023; PET/CT 12/28/2022; CT abdomen and pelvis 10/17/2022; CT chest abdomen and pelvis 10/10/2022 FINDINGS: CTA CHEST FINDINGS Cardiovascular: Negative for acute pulmonary embolism. Normal caliber thoracic aorta without dissection. Coronary artery and aortic atherosclerotic calcification. No pericardial effusion. Right PICC tip in the low SVC. Mediastinum/Nodes: No thoracic adenopathy. Unremarkable trachea and esophagus. Lungs/Pleura: Bronchial wall thickening and scattered mucous plugging greatest in the right lower lobe. Bilateral lower lung bronchiolectasis. Scattered centrilobular and budding tree micro nodules greatest in the lingula. Decreased size of the thick-walled cavitary mass in the lingula compared with PET/CT 12/28/2022. This measures 3.2 x 1.7 cm today, previously 3.2 x 2.5 cm. Small right pleural effusion and associated atelectasis. No pneumothorax. Musculoskeletal: Subcutaneous emphysema in the anterolateral left chest wall. Loosely organized left axillary postprocedural hematoma/seroma measuring 4.9 x 2.2 x 4.6 cm related to recent axillary node biopsy. Thoracic kyphosis. No acute fracture. Fused anterior enthesophytes throughout the cervical and thoracic spine. Review of the MIP images confirms the above findings. CTA ABDOMEN AND PELVIS FINDINGS VASCULAR Aorta: Infrarenal abdominal aortic aneurysm again measuring up to 5.0 cm in greatest diameter. Aortic calcified atherosclerosis. No dissection. Celiac: No hemodynamically significant stenosis, aneurysm, or dissection. SMA: No hemodynamically significant stenosis, aneurysm, or dissection. Renals: No hemodynamically significant stenosis, aneurysm, or dissection. IMA: Patent. Inflow: Calcified atherosclerosis without significant stenosis. No aneurysm or  dissection. Veins: No obvious venous abnormality within the limitations of this arterial phase study. Review of the MIP images confirms the above findings. NON-VASCULAR Hepatobiliary: Cholecystectomy. No focal hepatic lesion. Unchanged biliary prominence. Pancreas: Fatty atrophy.  No acute abnormality. Spleen: Unremarkable. Adrenals/Urinary Tract: Stable adrenal glands. No urinary calculi or hydronephrosis. Unchanged 1.4 cm cystic lesion in the posterior right kidney. Unremarkable bladder. Stomach/Bowel: Normal caliber large and small bowel. Colonic diverticulosis without diverticulitis. Stomach is within normal limits. Normal appendix. Lymphatic: No lymphadenopathy. Reproductive: Unremarkable. Other: No free intraperitoneal fluid or air. Musculoskeletal: Demineralization. Bilateral femoral head AVN. Fused anterior syndesmophytes in the lumbar spine. Review of the MIP images confirms the above findings. IMPRESSION: 1. Negative for acute pulmonary embolism. 2. Infrarenal abdominal aortic aneurysm again measuring up to 5.0 cm in greatest diameter. Recommend follow-up CT/MR every 6 months and vascular consultation if not obtained. This recommendation follows ACR consensus guidelines: White Paper of the ACR Incidental Findings Committee II on Vascular Findings. J Am Coll Radiol 2013; 10:789-794. 3. Decreased size of the cavitary lesion in the lingula since PET/CT 12/28/2022. 4. Pulmonary findings compatible with infectious/inflammatory bronchiolitis. 5. Small right pleural effusion and associated atelectasis. 6. Loosely organized left axillary postprocedural hematoma/seroma measuring 4.9 x 2.2 x 4.6 cm related to recent axillary node biopsy. 7. 1.4 cm cystic lesion in the posterior right kidney. If clinically appropriate, renal mass protocol MRI could be obtained for further evaluation. Aortic Atherosclerosis (ICD10-I70.0). Electronically Signed   By: Minerva Fester M.D.   On: 02/18/2023 01:03   DG Chest Port 1  View  Result Date: 02/17/2023 CLINICAL DATA:  Hypoxia. EXAM: PORTABLE  CHEST 1 VIEW COMPARISON:  Chest radiograph dated 01/11/2023. FINDINGS: The heart size and mediastinal contours are within normal limits. There is mild right basilar atelectasis/airspace disease. A small right pleural effusion may contribute. Left interstitial and airspace opacities in the left mid lung appear similar to prior exam and may reflect an incompletely resolved cavitary lesion. No left pleural effusion. No pneumothorax on either side. A right upper extremity peripherally inserted central venous catheter tip overlies the superior cavoatrial junction. Degenerative changes are seen in the spine. IMPRESSION: 1. Mild right basilar atelectasis/airspace disease. A small right pleural effusion may contribute. 2. Left interstitial and airspace opacities in the left mid lung appear similar to prior exam and may reflect an incompletely resolved cavitary lesion. Electronically Signed   By: Romona Curls M.D.   On: 02/17/2023 20:08     Assessment and Plan:   Nonsustained ventricular tachycardia -Nonsustained V. tach in the emergency department -Initiated on amiodarone infusion -Currently sinus rhythm with a first-degree AV block and PACs -If remains sinus will transition off drip to oral amiodarone tomorrow -TSH added to labs -Continue with telemetry monitoring -High-sensitivity troponin negative x 2 -CTA chest negative for PE -Recommend keeping potassium greater than 4 less than 5 and magnesium of 2 -If blood pressure remains stable, restart on PTA carvedilol -Will need further ischemic workup after echocardiogram is completed -Will likely need EP consultation  Chronic HFpEF -LVEF 60 to 65% in 2020 -Echocardiogram ordered and pending with further recommendations to follow -No documented urine output since admission -Not currently in exacerbation -Continue with heart failure education -Daily weights, I's and O's, low-sodium  diet  COPD -Not currently in exacerbation -Continued on oxygen via nasal cannula -Continued management per IM  Hypertension with a history of hypertension -Blood pressure 112/53 -Hypotensive after amiodarone initiated in the emergency department requiring fluid boluses -PTA antihypertensives remain on hold -Vital signs per unit protocol  Anemia -Hemoglobin 9.1 -Remained stable with no active signs of bleeding -Daily CBC -Continue to trend hemoglobin recommend keeping greater than 8  Infrarenal abdominal aortic aneurysm without rupture -Measured 5 cm on last CT scan -Follows with [facility] Dr. Wyn Quaker -Upcoming appointment on 8/27 who is arranging perioperative clearance for endovascular repair -Continue with outpatient follow-up with [facility]  Traumatic closed fracture of C2 -Continued with c-collar -Continue with pain management    Risk Assessment/Risk Scores:                For questions or updates, please contact [facility] Please consult [email or web address] for contact info under    Signed, Tarig Zimmers, NP  02/18/2023 11:52 AM

## 2023-02-18 NOTE — Progress Notes (Signed)
Progress Note   Patient: Aaron Gibbs OZH:[id number] DOB: [birth year] DOA: 02/17/2023     1 DOS: the patient was seen and examined on 02/18/2023   Brief hospital course:   Aaron Gibbs is a 88 y.o. 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  At baseline patient is able to ambulate but needed assistance.  He has had no nausea, vomiting, abdominal pain, diarrhea or dysuria or cough, fever or chills or shortness of breath.  Denies chest pain, lower extremity pain or swelling. ED course and data review: Briefly hypoxic to 89 with otherwise normal vitals.  CBC and BMP notable for hemoglobin of 8.6 down from 9.82 days ago on the day of resection and 11.7 a month ago.  WBC normal and lactic acid 0.6. Potassium and magnesium normal Lactic acid 0.6 Troponin 15 EKG #1 ectopic atrial rhythm at 67 Chest x-ray showed small right pleural effusion but otherwise unchanged findings from 01/11/2023   While in the emergency room patient went into asymptomatic V. tach with episodes lasting 15 to 30 seconds with reading EKG #2 showed sinus tachycardia at 125 with atrial premature complex and LBBB   Patient was loaded with amiodarone bolus followed by infusion  He had a hypotensive response to Amio with systolic going as low as the 70s but was fluid responsive to 121/51 at the time of admission  Patient subsequently remained stable Hospitalist consulted for admission.    09/01 -patient is seen and examined at the bedside.  Complains of pain in his  neck and in his left forearm.  Remains on amiodarone drip    Assessment and Plan:  * NSVT (nonsustained ventricular tachycardia) (HCC) Patient with repeated episodes of nonsustained V. tach in the ED lasting up to 30 seconds and symptomatic with an episode of hypotension that responded to IV fluid resuscitation Converted to sinus with amiodarone bolus.  Remains on amiodarone infusion and will be transition to oral amiodarone in a.m. 2D echocardiogram is pending. Appreciate cardiology input.  If 2D echocardiogram shows normal LVEF,  patient will be scheduled for [facility] and if abnormal will proceed with cardiac catheterization Patient was on carvedilol 12.5 mg twice daily and it has been decreased to 3.125 mg twice daily due to bradycardia and hypotension Had an episode of nonsustained V. tach despite being on amiodarone.    Hypotension Improved.  Patient is currently normotensive Continue to hold amlodipine, olmesartan and Bumex Dose of carvedilol has been decreased to 3.125 mg twice daily    Generalized weakness Probably related to hypotension and bradycardia No stigmata of infection Discussed with caregiver at the bedside and patient is usually able to transfer using a walker into his wheelchair but was unable to do that on the day of admission. 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 (HCC) PICC line in place for venous access from recent procedure on 8/29 Patient had PICC line placed due to poor venous access  Will keep PICC line for now No acute issues related to wound suspected  Wound care consult Continue outpatient doxycycline   Anemia of chronic disease Patient noted to be anemic with hemoglobin 8.6 Review of records reveals that he had significant bleeding from left forearm lesion prior to resection H&H appears stable Continue home iron     Traumatic closed  fracture of C2 vertebra with minimal displacement, sequela Continue c-collar Pain control   Infrarenal abdominal aortic aneurysm (AAA) without rupture (HCC) Seen by vascular, Dr. Wyn Quaker on 8/27 who is arranging preoperative clearance for endovascular repair. Last aneurysm size was 5 cm Patient will benefit from ischemic evaluation by cardiology prior to endovascular repair.   Chronic heart failure with preserved ejection fraction (HCC) Clinically euvolemic Holding olmesartan and Bumex overnight due to hypertensive response to Amio Continue Coreg Daily weights given IV fluid hydration   Depression with anxiety Continue trazodone and duloxetine and Zyprexa   COPD (chronic obstructive pulmonary disease) (HCC) Not acutely exacerbated Continue home inhalers with DuoNebs as needed   Long term current use of opiate analgesic Continue Butrans patch, Voltaren gel, oxycodone   Blindness Retinitis pigmentosa of both eyes Increase nursing per protocol for blindness Continue eyedrops oxycodone             Subjective: Patient is seen and examined at the bedside.  Caregiver is at the bedside.  Complains of pain in his neck and in his left forearm.  Had another episode of nonsustained ventricular tachycardia  Physical Exam: Vitals:   02/18/23 0900 02/18/23 1000 02/18/23 1100 02/18/23 1200  BP: 119/69 107/67 (!) 112/53   Pulse: 67 68 69   Resp: 13 (!) 21 18   Temp:    97.9 F (36.6 C)  TempSrc:    Oral  SpO2: 93% 93% 90%   Weight:      Height:       Vitals and nursing note reviewed.  Constitutional:      General: He is sleeping. He is not in acute distress.    Comments: Frail-appearing elderly male  HENT:     Head: Normocephalic and atraumatic.  Neck:     Comments: Soft cervical collar Cardiovascular:     Rate and Rhythm: Normal rate and regular rhythm.     Heart sounds: Normal heart sounds.  Pulmonary:     Effort: Pulmonary effort is normal.     Breath sounds: Normal  breath sounds.  Abdominal:     Palpations: Abdomen is soft.     Tenderness: There is no abdominal tenderness.  Musculoskeletal:     Comments: Left forearm in bandage from recent procedure  Neurological:     Mental Status: He is easily aroused. Mental status is at baseline. He is awake and alert.   Data Reviewed:  There are no new results to review at this time.  Family Communication: Called and left a Recruitment consultant for patient's daughter [name]. Awaiting call back  Disposition: Status is: Inpatient Remains inpatient appropriate because: On IV amiodarone  Planned Discharge Destination: Home    Time spent: 36 minutes  Author: Lucile Shutters, MD 02/18/2023 2:05 PM  For on call review [email or web address].

## 2023-02-18 NOTE — ED Notes (Signed)
Advised nurse that patient has ready bed 

## 2023-02-18 NOTE — Progress Notes (Signed)
1327- Pt had 3 short runs of Vtach. First run @ 1323, 1324, and 1327.Pt asymptomatic with each run of Vtach. Dr. Joylene Igo notified.

## 2023-02-18 NOTE — ED Notes (Signed)
MD notified of hypotension. Fluid bolus started

## 2023-02-18 NOTE — Plan of Care (Signed)
  Problem: Education: Goal: Knowledge of condition and prescribed therapy will improve Outcome: Progressing   Problem: Cardiac: Goal: Will achieve and/or maintain adequate cardiac output Outcome: Progressing   Problem: Physical Regulation: Goal: Complications related to the disease process, condition or treatment will be avoided or minimized Outcome: Progressing   Problem: Education: Goal: Knowledge of General Education information will improve Description: Including pain rating scale, medication(s)/side effects and non-pharmacologic comfort measures Outcome: Progressing   Problem: Health Behavior/Discharge Planning: Goal: Ability to manage health-related needs will improve Outcome: Progressing   Problem: Clinical Measurements: Goal: Ability to maintain clinical measurements within normal limits will improve Outcome: Progressing Goal: Will remain free from infection Outcome: Progressing Goal: Diagnostic test results will improve Outcome: Progressing Goal: Respiratory complications will improve Outcome: Progressing Goal: Cardiovascular complication will be avoided Outcome: Progressing   Problem: Activity: Goal: Risk for activity intolerance will decrease Outcome: Progressing   Problem: Nutrition: Goal: Adequate nutrition will be maintained Outcome: Progressing   Problem: Coping: Goal: Level of anxiety will decrease Outcome: Progressing   Problem: Elimination: Goal: Will not experience complications related to bowel motility Outcome: Progressing Goal: Will not experience complications related to urinary retention Outcome: Progressing   Problem: Pain Managment: Goal: General experience of comfort will improve Outcome: Progressing   Problem: Safety: Goal: Ability to remain free from injury will improve Outcome: Progressing   Problem: Skin Integrity: Goal: Risk for impaired skin integrity will decrease Outcome: Progressing   Problem: Education: Goal:  Knowledge of General Education information will improve Description: Including pain rating scale, medication(s)/side effects and non-pharmacologic comfort measures Outcome: Progressing   Problem: Health Behavior/Discharge Planning: Goal: Ability to manage health-related needs will improve Outcome: Progressing   Problem: Clinical Measurements: Goal: Ability to maintain clinical measurements within normal limits will improve Outcome: Progressing Goal: Will remain free from infection Outcome: Progressing Goal: Diagnostic test results will improve Outcome: Progressing Goal: Respiratory complications will improve Outcome: Progressing Goal: Cardiovascular complication will be avoided Outcome: Progressing   Problem: Activity: Goal: Risk for activity intolerance will decrease Outcome: Progressing   Problem: Nutrition: Goal: Adequate nutrition will be maintained Outcome: Progressing   Problem: Coping: Goal: Level of anxiety will decrease Outcome: Progressing   Problem: Elimination: Goal: Will not experience complications related to bowel motility Outcome: Progressing Goal: Will not experience complications related to urinary retention Outcome: Progressing   Problem: Pain Managment: Goal: General experience of comfort will improve Outcome: Progressing   Problem: Safety: Goal: Ability to remain free from injury will improve Outcome: Progressing   Problem: Skin Integrity: Goal: Risk for impaired skin integrity will decrease Outcome: Progressing

## 2023-02-18 NOTE — Consult Note (Signed)
WOC consulted for RFA wound, patient has had recent surgical intervention per Dr. Donell Beers for melanoma/ 13.8cm x 11.4 cm area cover with Myriad matrix 8/28. This needs to stay intact, moist. Orders written. FU with Dr. Donell Beers outpatient as scheduled.  Please contact [facility] for further others if needed  Re consult if needed, will not follow at this time. Thanks  Aaron Gibbs [employer], RN,CWOCN, CNS, CWON-AP [phone])

## 2023-02-19 ENCOUNTER — Inpatient Hospital Stay: Payer: Medicare Other

## 2023-02-19 ENCOUNTER — Other Ambulatory Visit: Payer: Self-pay | Admitting: Family Medicine

## 2023-02-19 DIAGNOSIS — R296 Repeated falls: Secondary | ICD-10-CM

## 2023-02-19 DIAGNOSIS — I472 Ventricular tachycardia, unspecified: Secondary | ICD-10-CM

## 2023-02-19 DIAGNOSIS — S12100A Unspecified displaced fracture of second cervical vertebra, initial encounter for closed fracture: Secondary | ICD-10-CM

## 2023-02-19 DIAGNOSIS — S12100S Unspecified displaced fracture of second cervical vertebra, sequela: Secondary | ICD-10-CM

## 2023-02-19 DIAGNOSIS — I7143 Infrarenal abdominal aortic aneurysm, without rupture: Secondary | ICD-10-CM | POA: Diagnosis not present

## 2023-02-19 DIAGNOSIS — R531 Weakness: Secondary | ICD-10-CM

## 2023-02-19 LAB — BASIC METABOLIC PANEL
Anion gap: 9 (ref 5–15)
BUN: 16 mg/dL (ref 8–23)
CO2: 24 mmol/L (ref 22–32)
Calcium: 8.6 mg/dL — ABNORMAL LOW (ref 8.9–10.3)
Chloride: 108 mmol/L (ref 98–111)
Creatinine, Ser: 1.04 mg/dL (ref 0.61–1.24)
GFR, Estimated: >60 mL/min (ref >60)
Glucose, Bld: 134 mg/dL — ABNORMAL HIGH (ref 70–99)
Potassium: 4.2 mmol/L (ref 3.5–5.1)
Sodium: 141 mmol/L (ref 135–145)

## 2023-02-19 LAB — ECHOCARDIOGRAM COMPLETE
AR max vel: 1.67 cm2
AV Area VTI: 2.01 cm2
AV Area mean vel: 2.02 cm2
AV Mean grad: 6 mmHg
AV Peak grad: 13.7 mmHg
Ao pk vel: 1.85 m/s
Area-P 1/2: 3.89 cm2
Height: 71 in
MV VTI: 2.08 cm2
P 1/2 time: 488 ms
S' Lateral: 3.2 cm
Weight: 3421.54 [oz_av]

## 2023-02-19 LAB — GLUCOSE, CAPILLARY: Glucose-Capillary: 113 mg/dL — ABNORMAL HIGH (ref 70–99)

## 2023-02-19 LAB — MAGNESIUM: Magnesium: 1.9 mg/dL (ref 1.7–2.4)

## 2023-02-19 MED ORDER — CHLORHEXIDINE GLUCONATE CLOTH 2 % EX PADS
6.0000 | MEDICATED_PAD | Freq: Every day | CUTANEOUS | Status: DC
  Administered 2023-02-19 – 2023-02-21 (×3): 6 via TOPICAL

## 2023-02-19 MED ORDER — MAGNESIUM SULFATE 2 GM/50ML IV SOLN
2.0000 g | Freq: Once | INTRAVENOUS | Status: AC
  Administered 2023-02-19: 2 g via INTRAVENOUS
  Filled 2023-02-19: qty 50

## 2023-02-19 MED ORDER — CHLORHEXIDINE GLUCONATE CLOTH 2 % EX PADS: 6.0000 | MEDICATED_PAD | Freq: Every day | CUTANEOUS | Status: DC

## 2023-02-19 MED ORDER — IOHEXOL 350 MG/ML SOLN
75.0000 mL | Freq: Once | INTRAVENOUS | Status: AC | PRN
  Administered 2023-02-19: 75 mL via INTRAVENOUS

## 2023-02-19 MED ORDER — OXYCODONE HCL 5 MG PO TABS
10.0000 mg | ORAL_TABLET | Freq: Four times a day (QID) | ORAL | Status: DC | PRN
  Administered 2023-02-19 – 2023-02-22 (×8): 10 mg via ORAL
  Filled 2023-02-19 (×8): qty 2

## 2023-02-19 MED ORDER — CYCLOBENZAPRINE HCL 10 MG PO TABS
5.0000 mg | ORAL_TABLET | Freq: Three times a day (TID) | ORAL | Status: DC
  Administered 2023-02-19 – 2023-02-21 (×8): 5 mg via ORAL
  Filled 2023-02-19 (×9): qty 1

## 2023-02-19 NOTE — Progress Notes (Signed)
Progress Note   Patient: Aaron Gibbs AVW:[id number] DOB: 1935/03/27 DOA: 02/17/2023     2 DOS: the patient was seen and examined on 02/19/2023   Brief hospital course:  Aaron Gibbs is a 88 y.o. 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  At baseline patient is able to ambulate but needed assistance.  He has had no nausea, vomiting, abdominal pain, diarrhea or dysuria or cough, fever or chills or shortness of breath.  Denies chest pain, lower extremity pain or swelling. ED course and data review: Briefly hypoxic to 89 with otherwise normal vitals.  CBC and BMP notable for hemoglobin of 8.6 down from 9.82 days ago on the day of resection and 11.7 a month ago.  WBC normal and lactic acid 0.6. Potassium and magnesium normal Lactic acid 0.6 Troponin 15 EKG #1 ectopic atrial rhythm at 67 Chest x-ray showed small right pleural effusion but otherwise unchanged findings from 01/11/2023   While in the emergency room patient went into asymptomatic V. tach with episodes lasting 15 to 30 seconds with reading EKG #2 showed sinus tachycardia at 125 with atrial premature complex and LBBB   Patient was loaded with amiodarone bolus followed by infusion  He had a hypotensive response to Amio with systolic going as low as the 70s but was fluid responsive to 121/51 at the time of admission  Patient subsequently remained stable Hospitalist consulted for admission.      09/01 -patient is seen and examined at the bedside.  Complains of pain in his  neck and in his left forearm.  Remains on amiodarone drip    Assessment and Plan:  * NSVT (nonsustained ventricular tachycardia) (HCC) Patient with repeated episodes of nonsustained V. tach in the ED lasting up to 30 seconds and symptomatic with an episode of hypotension that responded to IV fluid resuscitation Converted to sinus with amiodarone bolus.  Remains on amiodarone infusion and will be transition to oral amiodarone in a.m. 2D echocardiogram shows an LVEF of 55 to 60% with normal LV function.  No regional wall motion abnormality.  Aortic dilatation noted, borderline dilatation of the aortic root measuring 40 mm. Appreciate cardiology input.  Initial plan was for Lexiscan stress test for ischemic evaluation but patient has severe neck pain and is unable to lay flat for any testing at this time.  Has a known history of a C2 fracture and has a cervical collar in place.  Will consult neurosurgery. Continue carvedilol 3.125 mg twice daily      Hypotension Improved.  Patient is currently normotensive Continue to hold amlodipine, olmesartan and Bumex Continue carvedilol has been decreased to 3.125 mg twice daily     Generalized weakness Probably related to hypotension and bradycardia No stigmata of infection Discussed with caregiver at the bedside and patient is usually able to transfer using a walker into his wheelchair but was unable to do that on the day of admission. 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 (HCC) PICC line in place for  venous access from recent procedure on 8/29 Patient had PICC line placed due to poor venous access  Will keep PICC line for now No acute issues related to wound suspected Wound care consult Continue outpatient doxycycline   Anemia of chronic disease Patient noted to be anemic with hemoglobin 8.6 Review of records reveals that he had  significant bleeding from left forearm lesion prior to resection H&H appears stable Continue home iron     Traumatic closed fracture of C2 vertebra with minimal displacement, sequela Neurosurgery consult due to worsening neck pain Discussed with Dr. Katrinka Blazing who recommends CT angiogram of the head and neck which shows chronic right V2 vertebral artery occlusion. There is faint V3/V4 reconstitution with new/interval severe stenosis or occlusion of the intradural right vertebral artery. Left vertebral artery remains patent. Chronic C2 fracture. Continue c-collar Pain control.  Increase hydrocodone to 10 mg p.o. every 6 hours    Infrarenal abdominal aortic aneurysm (AAA) without rupture (HCC) Seen by vascular, Dr. Wyn Quaker on 8/27 who is arranging preoperative clearance for endovascular repair. Last aneurysm size was 5 cm Patient will benefit from ischemic evaluation by cardiology prior to endovascular repair.   Chronic heart failure with preserved ejection fraction (HCC) Clinically euvolemic Holding olmesartan and Bumex overnight due to hypotensive response following administration of amiodarone Continue Coreg Daily weights given IV fluid hydration   Depression with anxiety Continue trazodone and duloxetine and Zyprexa   COPD (chronic obstructive pulmonary disease) (HCC) Not acutely exacerbated Continue home inhalers with DuoNebs as needed   Long term current use of opiate analgesic Continue Butrans patch, Voltaren gel, oxycodone   Blindness Retinitis pigmentosa of both eyes Increase nursing per protocol for blindness Continue eyedrops oxycodone           Subjective: Seen and examined at the bedside and complains of severe neck pain  Physical Exam: Vitals:   02/19/23 1230 02/19/23 1240 02/19/23 1250 02/19/23 1300  BP:    (!) 141/75  Pulse: 72 71 71   Resp: (!) 23 (!) 28 (!) 22   Temp:      TempSrc:      SpO2: 96% 95% 98%   Weight:      Height:       Vitals and nursing  note reviewed.  Constitutional:      General: He is sleeping. He is not in acute distress.    Comments: Frail-appearing elderly male  HENT:     Head: Normocephalic and atraumatic.  Neck:     Comments: Soft cervical collar Cardiovascular:     Rate and Rhythm: Normal rate and regular rhythm.     Heart sounds: Normal heart sounds.  Pulmonary:     Effort: Pulmonary effort is normal.     Breath sounds: Normal breath sounds.  Abdominal:     Palpations: Abdomen is soft.     Tenderness: There is no abdominal tenderness.  Musculoskeletal:     Comments: Left forearm in bandage from recent procedure  Neurological:     Mental Status: He is easily aroused. Mental status is at baseline. He is awake and alert.     Data Reviewed: Labs reviewed.  Magnesium 1.9 There are no new results to review at this time.  Family Communication: Called and spoke to patient's daughter Tarek Mclane over the phone.  All questions and concerns have been addressed  Disposition: Status is: Inpatient Remains inpatient appropriate because: Pain control.  Remains on IV amiodarone  Planned Discharge Destination: Home    Time  spent: 35 minutes  Author: Lucile Shutters, MD 02/19/2023 2:22 PM  For on call review [email or web address].

## 2023-02-19 NOTE — Plan of Care (Signed)
  Problem: Education: Goal: Knowledge of condition and prescribed therapy will improve Outcome: Not Applicable   Problem: Cardiac: Goal: Will achieve and/or maintain adequate cardiac output Outcome: Not Applicable   Problem: Physical Regulation: Goal: Complications related to the disease process, condition or treatment will be avoided or minimized Outcome: Not Applicable   Problem: Education: Goal: Knowledge of General Education information will improve Description: Including pain rating scale, medication(s)/side effects and non-pharmacologic comfort measures Outcome: Not Applicable   Problem: Health Behavior/Discharge Planning: Goal: Ability to manage health-related needs will improve Outcome: Not Applicable   Problem: Clinical Measurements: Goal: Ability to maintain clinical measurements within normal limits will improve Outcome: Not Applicable Goal: Will remain free from infection Outcome: Not Applicable Goal: Diagnostic test results will improve Outcome: Not Applicable Goal: Respiratory complications will improve Outcome: Not Applicable Goal: Cardiovascular complication will be avoided Outcome: Not Applicable   Problem: Activity: Goal: Risk for activity intolerance will decrease Outcome: Not Applicable   Problem: Nutrition: Goal: Adequate nutrition will be maintained Outcome: Not Applicable   Problem: Coping: Goal: Level of anxiety will decrease Outcome: Not Applicable   Problem: Elimination: Goal: Will not experience complications related to bowel motility Outcome: Not Applicable Goal: Will not experience complications related to urinary retention Outcome: Not Applicable   Problem: Pain Managment: Goal: General experience of comfort will improve Outcome: Not Applicable   Problem: Safety: Goal: Ability to remain free from injury will improve Outcome: Not Applicable   Problem: Skin Integrity: Goal: Risk for impaired skin integrity will decrease Outcome:  Not Applicable   Problem: Education: Goal: Knowledge of General Education information will improve Description: Including pain rating scale, medication(s)/side effects and non-pharmacologic comfort measures Outcome: Not Applicable   Problem: Health Behavior/Discharge Planning: Goal: Ability to manage health-related needs will improve Outcome: Not Applicable   Problem: Clinical Measurements: Goal: Ability to maintain clinical measurements within normal limits will improve Outcome: Not Applicable Goal: Will remain free from infection Outcome: Not Applicable Goal: Diagnostic test results will improve Outcome: Not Applicable Goal: Respiratory complications will improve Outcome: Not Applicable Goal: Cardiovascular complication will be avoided Outcome: Not Applicable   Problem: Activity: Goal: Risk for activity intolerance will decrease Outcome: Not Applicable   Problem: Nutrition: Goal: Adequate nutrition will be maintained Outcome: Not Applicable   Problem: Coping: Goal: Level of anxiety will decrease Outcome: Not Applicable   Problem: Elimination: Goal: Will not experience complications related to bowel motility Outcome: Not Applicable Goal: Will not experience complications related to urinary retention Outcome: Not Applicable   Problem: Pain Managment: Goal: General experience of comfort will improve Outcome: Not Applicable   Problem: Safety: Goal: Ability to remain free from injury will improve Outcome: Not Applicable   Problem: Skin Integrity: Goal: Risk for impaired skin integrity will decrease Outcome: Not Applicable

## 2023-02-19 NOTE — Plan of Care (Signed)

## 2023-02-19 NOTE — Consult Note (Signed)
Consulting Department:  ICU, Agbata MD  Primary Physician:  Smitty Cords, DO  Chief Complaint:  History of spinal fracture.   History of Present Illness: 02/19/2023 Aaron Gibbs is a 88 y.o. male who presents with the chief complaint of Neck pain and a cervical fracture.  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  Does have a history of metastatic melanoma.  Was recently admitted for decreased physical tolerance.  Was found to have multiple arrhythmias in the ER and admitted to the stepdown unit/ICU.  Given his history of cervical spinal fracture and worsened pain neurosurgery was consulted.  Patient states that he has not had any new numbness weakness or tingling.  No new bowel or bladder issues.  States that he has had chronic pain for the past 5 months but feels like it is worsening over the past month or 2.  He has been treated with a cervical collar.  Said that he followed up with a cervical spine specialist but cannot remember his name or where this was performed.  They were treating him with a cervical spinal collar.  Review of Systems:  A 10 point review of systems is negative, except for the pertinent positives and negatives detailed in the HPI.  Past Medical History: Past Medical History:  Diagnosis Date   (HFpEF) heart failure with preserved ejection fraction (HCC)    AAA (abdominal aortic aneurysm) without rupture (HCC)    5cm/Followed by Luci Bank MD/Vascular/[facility]   Ankylosing spondylitis lumbar region [facility])    Anxiety    C2 cervical fracture (HCC) 09/2022   Traumatic Fall. Medically manage/no surgical intervention/Soft Collar   Chronic pain after traumatic injury 09/2022   Traumatic Fall= C2 Fracture. 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)    Left Forearm   Major depressive disorder, recurrent episode, severe (HCC)    Melanoma in situ of left upper arm (HCC)    s/p excision 02/15/23   Multiple falls    Opioid dependence (HCC)    Osteoarthritis    Retinitis pigmentosa of both eyes    Legally Blind   Sensorineural hearing loss (SNHL) of both ears    Suicide attempt (HCC) (05/13/18) 06/26/2018    Past Surgical History: Past Surgical History:  Procedure Laterality Date   CHOLECYSTECTOMY     COLONOSCOPY     EXCISION MELANOMA WITH SENTINEL LYMPH NODE BIOPSY Left 01/11/2023   Procedure: CAUTERIZATION OF BLEEDING WOUND ON LEFT FOREARM;  Surgeon: [name], MD;  Location: [facility]S;  Service: General;  Laterality: Left;   EXCISION MELANOMA WITH SENTINEL LYMPH NODE BIOPSY N/A 02/15/2023   Procedure: WIDE LOCAL EXCISION LEFT FOREARM MELANOMA SITES WITH SKIN SUBSTITUTE COVERAGE, SENTINEL LYMPH NODE BIOPSY;  Surgeon: [name], MD;  Location: [facility] OR;  Service: General;  Laterality: N/A;   TRANSURETHRAL RESECTION OF PROSTATE N/A 2008    Allergies: Allergies as of 02/17/2023 - Review Complete 02/17/2023  Allergen Reaction Noted   Anthralin Shortness Of Breath 07/17/2022   Azithromycin Shortness Of Breath 05/01/2018    Medications:  Current Facility-Administered Medications:    acetaminophen (TYLENOL) tablet 650 mg, 650 mg, Oral, Q6H PRN, 650 mg at 02/19/23 1307 **OR** acetaminophen (TYLENOL) suppository 650 mg, 650 mg, Rectal, Q6H PRN, Andris Baumann, MD   albuterol (PROVENTIL) (2.5 MG/3ML) 0.083% nebulizer solution 2.5 mg, 2.5 mg, Nebulization, Q4H PRN, Otelia Sergeant, RPH   [COMPLETED] amiodarone (NEXTERONE) 1.8 mg/mL load via infusion 150 mg, 150 mg, Intravenous, Once, 150 mg at  02/17/23 2015 **FOLLOWED BY** [EXPIRED] amiodarone (NEXTERONE PREMIX) 360-4.14 MG/200ML-% (1.8 mg/mL) IV infusion, 60 mg/hr, Intravenous, Continuous, Stopped at 02/18/23 0159 **FOLLOWED BY** amiodarone (NEXTERONE PREMIX) 360-4.14 MG/200ML-% (1.8 mg/mL) IV infusion, 30 mg/hr, Intravenous, Continuous, Siadecki, Wilmon Pali, MD, Last Rate: 16.67 mL/hr at 02/19/23 1030, 30 mg/hr at 02/19/23 1030   buprenorphine (BUTRANS) 10 MCG/HR 1 patch, 1 patch, Transdermal, Weekly, Andris Baumann, MD, 1 patch at 02/18/23 0918   carvedilol (COREG) tablet 3.125 mg, 3.125 mg, Oral, BID WC, Chilton Si, MD, 3.125 mg at 02/19/23 0843   Chlorhexidine Gluconate Cloth 2 % PADS 6 each, 6 each, Topical, Q0600, Agbata, Tochukwu, MD, 6 each at 02/19/23 6283   diclofenac Sodium (VOLTAREN) 1 % topical gel 2 g, 2 g, Topical, QHS, Duncan, Hazel V, MD   dorzolamide-timolol (COSOPT) 2-0.5 % ophthalmic solution 1 drop, 1 drop, Both Eyes, BID, Lindajo Royal V, MD, 1 drop at 02/19/23 0844   doxycycline (VIBRA-TABS) tablet 100 mg, 100 mg, Oral, BID, Lindajo Royal V, MD, 100 mg at 02/19/23 0843   DULoxetine (CYMBALTA) DR capsule 90 mg, 90 mg, Oral, Daily, Lindajo Royal V, MD, 90 mg at 02/19/23 0842   enoxaparin (LOVENOX) injection 40 mg, 40 mg, Subcutaneous, Q24H, Lindajo Royal V, MD, 40 mg at 02/19/23 1517   ferrous sulfate tablet 325 mg, 325 mg, Oral, Q supper, Lindajo Royal V, MD, 325 mg at 02/18/23 1722   finasteride (PROSCAR) tablet 5 mg, 5 mg, Oral, Daily, Lindajo Royal V, MD, 5 mg at 02/19/23 0843   gabapentin (NEURONTIN) capsule 300 mg, 300 mg, Oral, BID, Lindajo Royal V, MD, 300 mg at 02/19/23 0843   latanoprost (XALATAN) 0.005 % ophthalmic solution 1 drop, 1 drop, Both Eyes, QHS, Lindajo Royal V, MD, 1 drop at 02/18/23 2137   magnesium sulfate IVPB 2 g 50 mL, 2 g, Intravenous, Once, Agbata, Tochukwu, MD, Last Rate: 50 mL/hr at 02/19/23 1444, 2 g at 02/19/23 1444   melatonin tablet 5 mg, 5 mg, Oral, QHS, Lindajo Royal V, MD, 5  mg at 02/18/23 2120   mupirocin ointment (BACTROBAN) 2 % 1 Application, 1 Application, Nasal, BID, Agbata, Tochukwu, MD, 1 Application at 02/19/23 0845   OLANZapine (ZYPREXA) tablet 2.5 mg, 2.5 mg, Oral, BID, Lindajo Royal V, MD, 2.5 mg at 02/19/23 0847   ondansetron (ZOFRAN) tablet 4 mg, 4 mg, Oral, Q6H PRN **OR** ondansetron (ZOFRAN) injection 4 mg, 4 mg, Intravenous, Q6H PRN, Andris Baumann, MD   oxyCODONE (Oxy IR/ROXICODONE) immediate release tablet 10 mg, 10 mg, Oral, Q6H PRN, Agbata, Tochukwu, MD   pantoprazole (PROTONIX) EC tablet 40 mg, 40 mg, Oral, QAC breakfast, Lindajo Royal V, MD, 40 mg at 02/19/23 0843   polyethylene glycol (MIRALAX / GLYCOLAX) packet 17 g, 17 g, Oral, BID, Lindajo Royal V, MD, 17 g at 02/19/23 0842   sodium chloride flush (NS) 0.9 % injection 3 mL, 3 mL, Intravenous, Q12H, Lindajo Royal V, MD, 3 mL at 02/19/23 0903   tamsulosin (FLOMAX) capsule 0.4 mg, 0.4 mg, Oral, Daily, Andris Baumann, MD, 0.4 mg  at 02/19/23 0843   traZODone (DESYREL) tablet 150 mg, 150 mg, Oral, QHS PRN, Andris Baumann, MD, 150 mg at 02/18/23 2120   Social History: Social History   Tobacco Use   Smoking status: Former    Types: Cigarettes   Smokeless tobacco: Former    Types: Chew   Tobacco comments:    stopped 15 years ago  Vaping Use   Vaping status: Never Used  Substance Use Topics   Alcohol use: Not Currently    Comment: past   Drug use: Never    Family Medical History: Family History  Problem Relation Age of Onset   Depression Mother    Heart disease Father     Physical Examination: Vitals:   02/19/23 1250 02/19/23 1300  BP:  (!) 141/75  Pulse: 71   Resp: (!) 22   Temp:    SpO2: 98%      General: Patient is well developed, well nourished, calm, collected, and in no apparent distress.  NEUROLOGICAL:  General: In no acute distress.   Awake, alert, oriented to person, place, and time.  Pupils equal round and reactive to light.  Facial tone is symmetric.   Tongue protrusion is midline.  There is no pronator drift.  ROM of spine: Tenderness to the midline palpation in his neck, also paraspinal tenderness noted.  Strength: He has antigravity strength in his bilateral upper and lower extremities well proximally and distally.  Does have some significant pain which is interfering with a detailed examination.  Bilateral upper and lower extremity sensation grossly intact to light touch  Reflexes are present at both the patellas and biceps.  Unable to relax significantly enough for clonus testing or Babinski testing.  Imaging: Narrative & Impression  CLINICAL DATA:  Headache, secondary (Ped 0-17y)   EXAM: CT ANGIOGRAPHY HEAD AND NECK WITH AND WITHOUT CONTRAST   TECHNIQUE: Multidetector CT imaging of the head and neck was performed using the standard protocol during bolus administration of intravenous contrast. Multiplanar CT image reconstructions and MIPs were obtained to evaluate the vascular anatomy. Carotid stenosis measurements (when applicable) are obtained utilizing NASCET criteria, using the distal internal carotid diameter as the denominator.   RADIATION DOSE REDUCTION: This exam was performed according to the departmental dose-optimization program which includes automated exposure control, adjustment of the mA and/or kV according to patient size and/or use of iterative reconstruction technique.   CONTRAST:  75mL OMNIPAQUE IOHEXOL 350 MG/ML SOLN   COMPARISON:  CT head June 11 24. CTA head/neck May 8 24.   FINDINGS: Limited study due to patient positioning (patient unable to lay on their back because of neck pain). Within this limitation:   CT HEAD FINDINGS   Brain: No evidence of acute infarction, hemorrhage, hydrocephalus, extra-axial collection or mass lesion/mass effect. Patchy white matter hypodensities are nonspecific but compatible with chronic microvascular ischemic change.   Vascular: See below.   Skull: No  acute fracture.   Sinuses/Orbits: Moderate paranasal sinus mucosal thickening. No acute orbital findings.   Other: No mastoid effusions.   Review of the MIP images confirms the above findings   CTA NECK FINDINGS   Aortic arch: Great vessel origins are patent. Aortic atherosclerosis.   Right carotid system: Atherosclerosis at the carotid bifurcation without greater than 50% stenosis.   Left carotid system: Atherosclerosis at the carotid bifurcation without greater than 50% stenosis.   Vertebral arteries: Chronically occluded distal right V2 vertebral artery with or distal faint reconstitution at the V3 segment. Left vertebral  artery is patent in the neck.   Skeleton: Chronic C2 dens fracture.   Other neck: No acute abnormality on limited assessment.   Upper chest:   Partially imaged right pleural effusion with overlying opacities, better characterized on recent CT chest.   Review of the MIP images confirms the above findings   CTA HEAD FINDINGS   Anterior circulation: Bilateral intracranial ICAs, MCAs, and ACAs are patent proximally.   Posterior circulation: Faint V3/V4 reconstitution with new/interval severe stenosis or occlusion of the intradural right vertebral artery. Left vertebral artery remains patent. The basilar artery and bilateral posterior cerebral arteries are patent without proximal high-grade stenosis.   Venous sinuses: As permitted by contrast timing, patent.   Review of the MIP images confirms the above findings   IMPRESSION: 1. Chronic right V2 vertebral artery occlusion. There is faint V3/V4 reconstitution with new/interval severe stenosis or occlusion of the intradural right vertebral artery. Left vertebral artery remains patent. 2. Chronic C2 fracture. 3. Partially imaged right pleural effusion with overlying opacities, better characterized on recent CT chest. 4. Limited study due to patient positioning.     Electronically Signed   By:  Feliberto Harts M.D.   On: 02/19/2023 13:12       I have personally reviewed the images and agree with the above interpretation.  Labs:    Latest Ref Rng & Units 02/18/2023   10:02 AM 02/17/2023    8:33 PM 02/15/2023    6:50 AM  CBC  WBC 4.0 - 10.5 K/uL 6.3  5.2  4.1   Hemoglobin 13.0 - 17.0 g/dL 9.1  8.6  9.8   Hematocrit 39.0 - 52.0 % 31.2  29.0  31.4   Platelets 150 - 400 K/uL 122  137  153        Assessment and Plan: Mr. Tango is a pleasant 88 y.o. male with a history of a cervical spinal fracture in April which has been treated conservatively with cervical collar.  We do not have the notes from his spine follow-up but he states that he has been following with a spine surgeon and is currently being treated conservatively with a collar.  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  Neurologically he states that he has not had any new symptoms.  He is blind at baseline but has not noticed any new numbness weakness or tingling.  Review of his imaging shows his chronic C2 fracture, the positioning in the scanner is in the lateral and makes it appear to have a somewhat worsened positioning, however this fracture has been present since April.  We requested a CTA which demonstrates the chronic V2 occlusion with some slight constitution at V3 V4.  His left side remains patent.  Would recommend discussion with neurology for any consideration of antiplatelet medication for his vertebral artery findings.  Patient stated that he is unsure whether or not he would want to go forward with any surgery on his cervical spine.  He has been previously treated conservatively.  Could consider muscle relaxants once he is hemodynamically stable as some of the pain appears to be spasmodic in origin  Will continue to be available for questions.  Lovenia Kim, MD/[facility] Dept. of  Neurosurgery

## 2023-02-19 NOTE — Progress Notes (Signed)
Rounding Note    Patient Name: Aaron Gibbs Date of Encounter: 02/19/2023  [facility] Cardiologist: Humberto Seals  Subjective   Significant neck pain, laying on his right side Reports unable to get comfortable even with pain medications every 4-6 hours Nurses report he is unable to lay on his back without significant discomfort Caretaker in the room, discussed recent events She has known him since July, reports his neck pain has been getting worse over the past several weeks Acutely this past Saturday, weak, unable to stand and transfer, unable to hold his cup of coffee in the morning After 1 hour of weakness, she brought him to the emergency room via EMS  Telemetry reviewed, stuttering VT 1:32 PM yesterday on and off with long runs until 1:37 PM  Prelim echo with normal LV function  Inpatient Medications    Scheduled Meds:  buprenorphine  1 patch Transdermal Weekly   carvedilol  3.125 mg Oral BID WC   Chlorhexidine Gluconate Cloth  6 each Topical Q0600   diclofenac Sodium  2 g Topical QHS   dorzolamide-timolol  1 drop Both Eyes BID   doxycycline  100 mg Oral BID   DULoxetine  90 mg Oral Daily   enoxaparin (LOVENOX) injection  40 mg Subcutaneous Q24H   ferrous sulfate  325 mg Oral Q supper   finasteride  5 mg Oral Daily   gabapentin  300 mg Oral BID   latanoprost  1 drop Both Eyes QHS   melatonin  5 mg Oral QHS   mupirocin ointment  1 Application Nasal BID   OLANZapine  2.5 mg Oral BID   pantoprazole  40 mg Oral QAC breakfast   polyethylene glycol  17 g Oral BID   sodium chloride flush  3 mL Intravenous Q12H   tamsulosin  0.4 mg Oral Daily   Continuous Infusions:  amiodarone 30 mg/hr (02/19/23 1030)   PRN Meds: acetaminophen **OR** acetaminophen, albuterol, ondansetron **OR** ondansetron (ZOFRAN) IV, oxyCODONE, traZODone   Vital Signs    Vitals:   02/19/23 0700 02/19/23 0800 02/19/23 0900 02/19/23 1000  BP:      Pulse: 71 71 73 71  Resp: (!)  21 (!) 24 (!) 23 20  Temp:  98.5 F (36.9 C)    TempSrc:  Oral    SpO2: 95% 96% 96% 96%  Weight:      Height:        Intake/Output Summary (Last 24 hours) at 02/19/2023 1105 Last data filed at 02/19/2023 0700 Gross per 24 hour  Intake 999.72 ml  Output 2150 ml  Net -1150.28 ml      02/19/2023    5:00 AM 02/17/2023    6:09 PM 02/15/2023    6:50 AM  Last 3 Weights  Weight (lbs) 218 lb 4.1 oz 213 lb 13.5 oz 214 lb  Weight (kg) 99 kg 97 kg 97.07 kg      Telemetry    Normal sinus rhythm with significant VT 1:32 PM until 1:37 PM- Personally Reviewed  ECG     - Personally Reviewed  Physical Exam   GEN: Reports he is in distress, neck pain Neck: No JVD Cardiac: RRR, no murmurs, rubs, or gallops.  Respiratory: Clear to auscultation bilaterally. GI: Soft, nontender, non-distended  MS: No edema; No deformity.  Unable to check range of motion Neuro:  Nonfocal, full exam not performed Psych: Normal affect   Labs    High Sensitivity Troponin:   Recent Labs  Lab 02/17/23 1837  02/17/23 2033  TROPONINIHS 14 15     Chemistry Recent Labs  Lab 02/17/23 2033 02/18/23 1002 02/19/23 1021  NA 139 140 141  K 4.2 4.5 4.2  CL 104 107 108  CO2 26 25 24   GLUCOSE 124* 123* 134*  BUN 22 21 16   CREATININE 1.18 1.08 1.04  CALCIUM 8.5* 8.3* 8.6*  MG 2.0 2.0 1.9  GFRNONAA >60 >60 >60  ANIONGAP 9 8 9     Lipids No results for input(s): "CHOL", "TRIG", "HDL", "LABVLDL", "LDLCALC", "CHOLHDL" in the last 168 hours.  Hematology Recent Labs  Lab 02/15/23 0650 02/17/23 2033 02/18/23 1002  WBC 4.1 5.2 6.3  RBC 3.29* 2.97* 3.12*  HGB 9.8* 8.6* 9.1*  HCT 31.4* 29.0* 31.2*  MCV 95.4 97.6 100.0  MCH 29.8 29.0 29.2  MCHC 31.2 29.7* 29.2*  RDW 15.9* 16.0* 15.9*  PLT 153 137* 122*   Thyroid  Recent Labs  Lab 02/18/23 1002  TSH 1.297    BNPNo results for input(s): "BNP", "PROBNP" in the last 168 hours.  DDimer  Recent Labs  Lab 02/17/23 2034  DDIMER 1.28*     Radiology     ECHOCARDIOGRAM COMPLETE  Result Date: 02/19/2023    ECHOCARDIOGRAM REPORT   Patient Name:   Aaron Gibbs Date of Exam: 02/18/2023 Medical Rec #:  [id number]        Height:       71.0 in Accession #:    [id number]       Weight:       213.8 lb Date of Birth:  [birth year]        BSA:          2.169 m Patient Age:    87 years         BP:           141/87 mmHg Patient Gender: M                HR:           68 bpm. Exam Location:  [facility] Procedure: 2D Echo, Cardiac Doppler and Color Doppler Indications:     Syncope R55  History:         Patient has no prior history of Echocardiogram examinations.                  CHF; Risk Factors:Hypertension.  Sonographer:     Neysa Bonito Roar Referring Phys:  [id number] Andris Baumann Diagnosing Phys: Yvonne Kendall MD IMPRESSIONS  1. Left ventricular ejection fraction, by estimation, is 55 to 60%. The left ventricle has normal function. The left ventricle has no regional wall motion abnormalities. Left ventricular diastolic parameters are indeterminate.  2. Right ventricular systolic function is normal. The right ventricular size is normal. Tricuspid regurgitation signal is inadequate for assessing PA pressure.  3. Left atrial size was moderately dilated.  4. The mitral valve is grossly normal. Trivial mitral valve regurgitation. No evidence of mitral stenosis.  5. The aortic valve was not well visualized. There is mild calcification of the aortic valve. There is mild thickening of the aortic valve. Aortic valve regurgitation is mild to moderate. Aortic valve sclerosis/calcification is present, without any evidence of aortic stenosis.  6. Aortic dilatation noted. There is borderline dilatation of the aortic root, measuring 40 mm. FINDINGS  Left Ventricle: Left ventricular ejection fraction, by estimation, is 55 to 60%. The left ventricle has normal function. The left ventricle has no regional wall motion abnormalities. The left ventricular internal cavity  size was normal in size. There is   borderline left ventricular hypertrophy. Left ventricular diastolic parameters are indeterminate. Right Ventricle: The right ventricular size is normal. Right vetricular wall thickness was not well visualized. Right ventricular systolic function is normal. Tricuspid regurgitation signal is inadequate for assessing PA pressure. Left Atrium: Left atrial size was moderately dilated. Right Atrium: Right atrial size was normal in size. Pericardium: The pericardium was not well visualized. Mitral Valve: The mitral valve is grossly normal. Trivial mitral valve regurgitation. No evidence of mitral valve stenosis. MV peak gradient, 2.9 mmHg. The mean mitral valve gradient is 2.0 mmHg. Tricuspid Valve: The tricuspid valve is not well visualized. Tricuspid valve regurgitation is trivial. Aortic Valve: The aortic valve was not well visualized. There is mild calcification of the aortic valve. There is mild thickening of the aortic valve. Aortic valve regurgitation is mild to moderate. Aortic regurgitation PHT measures 488 msec. Aortic valve sclerosis/calcification is present, without any evidence of aortic stenosis. Aortic valve mean gradient measures 6.0 mmHg. Aortic valve peak gradient measures 13.7 mmHg. Aortic valve area, by VTI measures 2.01 cm. Pulmonic Valve: The pulmonic valve was normal in structure. Pulmonic valve regurgitation is trivial. No evidence of pulmonic stenosis. Aorta: Aortic dilatation noted. There is borderline dilatation of the aortic root, measuring 40 mm. Pulmonary Artery: The pulmonary artery is of normal size. Venous: The inferior vena cava was not well visualized. IAS/Shunts: The interatrial septum was not well visualized.  LEFT VENTRICLE PLAX 2D LVIDd:         4.50 cm   Diastology LVIDs:         3.20 cm   LV e' medial:    10.80 cm/s LV PW:         1.00 cm   LV E/e' medial:  6.8 LV IVS:        1.10 cm   LV e' lateral:   9.46 cm/s LVOT diam:     1.90 cm   LV E/e' lateral: 7.8 LV SV:         69 LV SV  Index:   32 LVOT Area:     2.84 cm  RIGHT VENTRICLE RV Basal diam:  3.30 cm RV Mid diam:    2.80 cm RV S prime:     11.70 cm/s TAPSE (M-mode): 2.6 cm LEFT ATRIUM              Index        RIGHT ATRIUM           Index LA diam:        4.15 cm  1.91 cm/m   RA Area:     17.60 cm LA Vol (A2C):   110.0 ml 50.71 ml/m  RA Volume:   41.50 ml  19.13 ml/m LA Vol (A4C):   85.5 ml  39.41 ml/m LA Biplane Vol: 98.8 ml  45.54 ml/m  AORTIC VALVE                     PULMONIC VALVE AV Area (Vmax):    1.67 cm      PV Vmax:          1.09 m/s AV Area (Vmean):   2.02 cm      PV Peak grad:     4.8 mmHg AV Area (VTI):     2.01 cm      PR End Diast Vel: 3.58 msec AV Vmax:  185.00 cm/s AV Vmean:          108.000 cm/s AV VTI:            0.343 m AV Peak Grad:      13.7 mmHg AV Mean Grad:      6.0 mmHg LVOT Vmax:         109.00 cm/s LVOT Vmean:        76.900 cm/s LVOT VTI:          0.243 m LVOT/AV VTI ratio: 0.71 AI PHT:            488 msec MITRAL VALVE MV Area (PHT): 3.89 cm    SHUNTS MV Area VTI:   2.08 cm    Systemic VTI:  0.24 m MV Peak grad:  2.9 mmHg    Systemic Diam: 1.90 cm MV Mean grad:  2.0 mmHg MV Vmax:       0.85 m/s MV Vmean:      60.5 cm/s MV Decel Time: 195 msec MV E velocity: 73.40 cm/s MV A velocity: 56.60 cm/s MV E/A ratio:  1.30 MV A Prime:    11.4 cm/s Yvonne Kendall MD Electronically signed by Yvonne Kendall MD Signature Date/Time: 02/19/2023/10:50:01 AM    Final    CT Angio Chest Pulmonary Embolism (PE) W or WO Contrast  Result Date: 02/18/2023 CLINICAL DATA:  Generalized weakness. Unable to ambulate as well as normal. Melanoma excision with sentinel lymph node biopsy on the left on 02/15/2023. PE suspected. Abdominal aortic aneurysm follow-up. EXAM: CT ANGIOGRAPHY CHEST, ABDOMEN AND PELVIS TECHNIQUE: Non-contrast CT of the chest was initially obtained. Multidetector CT imaging through the chest, abdomen and pelvis was performed using the standard protocol during bolus administration of intravenous  contrast. Multiplanar reconstructed images and MIPs were obtained and reviewed to evaluate the vascular anatomy. RADIATION DOSE REDUCTION: This exam was performed according to the departmental dose-optimization program which includes automated exposure control, adjustment of the mA and/or kV according to patient size and/or use of iterative reconstruction technique. CONTRAST:  OMNIPAQUE IOHEXOL 350 MG/ML SOLN COMPARISON:  Chest radiograph 02/17/2023; PET/CT 12/28/2022; CT abdomen and pelvis 10/17/2022; CT chest abdomen and pelvis 10/10/2022 FINDINGS: CTA CHEST FINDINGS Cardiovascular: Negative for acute pulmonary embolism. Normal caliber thoracic aorta without dissection. Coronary artery and aortic atherosclerotic calcification. No pericardial effusion. Right PICC tip in the low SVC. Mediastinum/Nodes: No thoracic adenopathy. Unremarkable trachea and esophagus. Lungs/Pleura: Bronchial wall thickening and scattered mucous plugging greatest in the right lower lobe. Bilateral lower lung bronchiolectasis. Scattered centrilobular and budding tree micro nodules greatest in the lingula. Decreased size of the thick-walled cavitary mass in the lingula compared with PET/CT 12/28/2022. This measures 3.2 x 1.7 cm today, previously 3.2 x 2.5 cm. Small right pleural effusion and associated atelectasis. No pneumothorax. Musculoskeletal: Subcutaneous emphysema in the anterolateral left chest wall. Loosely organized left axillary postprocedural hematoma/seroma measuring 4.9 x 2.2 x 4.6 cm related to recent axillary node biopsy. Thoracic kyphosis. No acute fracture. Fused anterior enthesophytes throughout the cervical and thoracic spine. Review of the MIP images confirms the above findings. CTA ABDOMEN AND PELVIS FINDINGS VASCULAR Aorta: Infrarenal abdominal aortic aneurysm again measuring up to 5.0 cm in greatest diameter. Aortic calcified atherosclerosis. No dissection. Celiac: No hemodynamically significant stenosis,  aneurysm, or dissection. SMA: No hemodynamically significant stenosis, aneurysm, or dissection. Renals: No hemodynamically significant stenosis, aneurysm, or dissection. IMA: Patent. Inflow: Calcified atherosclerosis without significant stenosis. No aneurysm or dissection. Veins: No obvious venous abnormality within the limitations of this  arterial phase study. Review of the MIP images confirms the above findings. NON-VASCULAR Hepatobiliary: Cholecystectomy. No focal hepatic lesion. Unchanged biliary prominence. Pancreas: Fatty atrophy.  No acute abnormality. Spleen: Unremarkable. Adrenals/Urinary Tract: Stable adrenal glands. No urinary calculi or hydronephrosis. Unchanged 1.4 cm cystic lesion in the posterior right kidney. Unremarkable bladder. Stomach/Bowel: Normal caliber large and small bowel. Colonic diverticulosis without diverticulitis. Stomach is within normal limits. Normal appendix. Lymphatic: No lymphadenopathy. Reproductive: Unremarkable. Other: No free intraperitoneal fluid or air. Musculoskeletal: Demineralization. Bilateral femoral head AVN. Fused anterior syndesmophytes in the lumbar spine. Review of the MIP images confirms the above findings. IMPRESSION: 1. Negative for acute pulmonary embolism. 2. Infrarenal abdominal aortic aneurysm again measuring up to 5.0 cm in greatest diameter. Recommend follow-up CT/MR every 6 months and vascular consultation if not obtained. This recommendation follows ACR consensus guidelines: White Paper of the ACR Incidental Findings Committee II on Vascular Findings. J Am Coll Radiol 2013; 10:789-794. 3. Decreased size of the cavitary lesion in the lingula since PET/CT 12/28/2022. 4. Pulmonary findings compatible with infectious/inflammatory bronchiolitis. 5. Small right pleural effusion and associated atelectasis. 6. Loosely organized left axillary postprocedural hematoma/seroma measuring 4.9 x 2.2 x 4.6 cm related to recent axillary node biopsy. 7. 1.4 cm cystic  lesion in the posterior right kidney. If clinically appropriate, renal mass protocol MRI could be obtained for further evaluation. Aortic Atherosclerosis (ICD10-I70.0). Electronically Signed   By: Minerva Fester M.D.   On: 02/18/2023 01:03   CT Angio Abd/Pel w/ and/or w/o  Result Date: 02/18/2023 CLINICAL DATA:  Generalized weakness. Unable to ambulate as well as normal. Melanoma excision with sentinel lymph node biopsy on the left on 02/15/2023. PE suspected. Abdominal aortic aneurysm follow-up. EXAM: CT ANGIOGRAPHY CHEST, ABDOMEN AND PELVIS TECHNIQUE: Non-contrast CT of the chest was initially obtained. Multidetector CT imaging through the chest, abdomen and pelvis was performed using the standard protocol during bolus administration of intravenous contrast. Multiplanar reconstructed images and MIPs were obtained and reviewed to evaluate the vascular anatomy. RADIATION DOSE REDUCTION: This exam was performed according to the departmental dose-optimization program which includes automated exposure control, adjustment of the mA and/or kV according to patient size and/or use of iterative reconstruction technique. CONTRAST:  OMNIPAQUE IOHEXOL 350 MG/ML SOLN COMPARISON:  Chest radiograph 02/17/2023; PET/CT 12/28/2022; CT abdomen and pelvis 10/17/2022; CT chest abdomen and pelvis 10/10/2022 FINDINGS: CTA CHEST FINDINGS Cardiovascular: Negative for acute pulmonary embolism. Normal caliber thoracic aorta without dissection. Coronary artery and aortic atherosclerotic calcification. No pericardial effusion. Right PICC tip in the low SVC. Mediastinum/Nodes: No thoracic adenopathy. Unremarkable trachea and esophagus. Lungs/Pleura: Bronchial wall thickening and scattered mucous plugging greatest in the right lower lobe. Bilateral lower lung bronchiolectasis. Scattered centrilobular and budding tree micro nodules greatest in the lingula. Decreased size of the thick-walled cavitary mass in the lingula compared with  PET/CT 12/28/2022. This measures 3.2 x 1.7 cm today, previously 3.2 x 2.5 cm. Small right pleural effusion and associated atelectasis. No pneumothorax. Musculoskeletal: Subcutaneous emphysema in the anterolateral left chest wall. Loosely organized left axillary postprocedural hematoma/seroma measuring 4.9 x 2.2 x 4.6 cm related to recent axillary node biopsy. Thoracic kyphosis. No acute fracture. Fused anterior enthesophytes throughout the cervical and thoracic spine. Review of the MIP images confirms the above findings. CTA ABDOMEN AND PELVIS FINDINGS VASCULAR Aorta: Infrarenal abdominal aortic aneurysm again measuring up to 5.0 cm in greatest diameter. Aortic calcified atherosclerosis. No dissection. Celiac: No hemodynamically significant stenosis, aneurysm, or dissection. SMA: No hemodynamically significant stenosis, aneurysm, or  dissection. Renals: No hemodynamically significant stenosis, aneurysm, or dissection. IMA: Patent. Inflow: Calcified atherosclerosis without significant stenosis. No aneurysm or dissection. Veins: No obvious venous abnormality within the limitations of this arterial phase study. Review of the MIP images confirms the above findings. NON-VASCULAR Hepatobiliary: Cholecystectomy. No focal hepatic lesion. Unchanged biliary prominence. Pancreas: Fatty atrophy.  No acute abnormality. Spleen: Unremarkable. Adrenals/Urinary Tract: Stable adrenal glands. No urinary calculi or hydronephrosis. Unchanged 1.4 cm cystic lesion in the posterior right kidney. Unremarkable bladder. Stomach/Bowel: Normal caliber large and small bowel. Colonic diverticulosis without diverticulitis. Stomach is within normal limits. Normal appendix. Lymphatic: No lymphadenopathy. Reproductive: Unremarkable. Other: No free intraperitoneal fluid or air. Musculoskeletal: Demineralization. Bilateral femoral head AVN. Fused anterior syndesmophytes in the lumbar spine. Review of the MIP images confirms the above findings.  IMPRESSION: 1. Negative for acute pulmonary embolism. 2. Infrarenal abdominal aortic aneurysm again measuring up to 5.0 cm in greatest diameter. Recommend follow-up CT/MR every 6 months and vascular consultation if not obtained. This recommendation follows ACR consensus guidelines: White Paper of the ACR Incidental Findings Committee II on Vascular Findings. J Am Coll Radiol 2013; 10:789-794. 3. Decreased size of the cavitary lesion in the lingula since PET/CT 12/28/2022. 4. Pulmonary findings compatible with infectious/inflammatory bronchiolitis. 5. Small right pleural effusion and associated atelectasis. 6. Loosely organized left axillary postprocedural hematoma/seroma measuring 4.9 x 2.2 x 4.6 cm related to recent axillary node biopsy. 7. 1.4 cm cystic lesion in the posterior right kidney. If clinically appropriate, renal mass protocol MRI could be obtained for further evaluation. Aortic Atherosclerosis (ICD10-I70.0). Electronically Signed   By: Minerva Fester M.D.   On: 02/18/2023 01:03   DG Chest Port 1 View  Result Date: 02/17/2023 CLINICAL DATA:  Hypoxia. EXAM: PORTABLE CHEST 1 VIEW COMPARISON:  Chest radiograph dated 01/11/2023. FINDINGS: The heart size and mediastinal contours are within normal limits. There is mild right basilar atelectasis/airspace disease. A small right pleural effusion may contribute. Left interstitial and airspace opacities in the left mid lung appear similar to prior exam and may reflect an incompletely resolved cavitary lesion. No left pleural effusion. No pneumothorax on either side. A right upper extremity peripherally inserted central venous catheter tip overlies the superior cavoatrial junction. Degenerative changes are seen in the spine. IMPRESSION: 1. Mild right basilar atelectasis/airspace disease. A small right pleural effusion may contribute. 2. Left interstitial and airspace opacities in the left mid lung appear similar to prior exam and may reflect an incompletely  resolved cavitary lesion. Electronically Signed   By: Romona Curls M.D.   On: 02/17/2023 20:08    Cardiac Studies   Echo  1. Left ventricular ejection fraction, by estimation, is 55 to 60%. The  left ventricle has normal function. The left ventricle has no regional  wall motion abnormalities. Left ventricular diastolic parameters are  indeterminate.   2. Right ventricular systolic function is normal. The right ventricular  size is normal. Tricuspid regurgitation signal is inadequate for assessing  PA pressure.   3. Left atrial size was moderately dilated.   4. The mitral valve is grossly normal. Trivial mitral valve  regurgitation. No evidence of mitral stenosis.   5. The aortic valve was not well visualized. There is mild calcification  of the aortic valve. There is mild thickening of the aortic valve. Aortic  valve regurgitation is mild to moderate. Aortic valve  sclerosis/calcification is present, without any  evidence of aortic stenosis.   6. Aortic dilatation noted. There is borderline dilatation of the aortic  root, measuring 40 mm.   Patient Profile     Aaron Gibbs is a 88 y.o. 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 status postresection (8/29) with continued PICC line in place for IV access who is being seen 02/18/2023 for the evaluation of ventricular tachycardia   Assessment & Plan    Sustained VT Noted in the emergency room, started on amiodarone infusion by ER physician -Long runs of VT noted at 1:32 PM until 1:37 PM, on and off consistently, quiet since then -Will recommend we continue amiodarone infusion additional 24 hours before transitioning to oral amiodarone -Echocardiogram with normal LV function -Cardiac enzymes negative, does not appear to be primary ischemic event -Unable to  complete ischemic workup at this time given inability to lay on his back from severe neck pain.  Poor candidate to lay for catheterization given neck pain -Continue reduced dose carvedilol (outpatient dose reduced in the setting of lower blood pressure)  Essential hypertension Continue carvedilol 3.125 twice daily  Neck pain Prior cervical spine CT June 2024 with mildly displaced type II dens fracture extending to the right lateral mass -Given immobility, severe pain requiring around-the-clock pain medication, recommend consultation with neurosurgery  Infrarenal abdominal aortic aneurysm Estimated 5 cm Appears to have reasonable anatomy for stent graft repair  left axillary postprocedural hematoma/seroma measuring 4.9 x 2.2 x 4.6 cm related to recent axillary node biopsy.   Total encounter time more than 50 minutes  Greater than 50% was spent in counseling and coordination of care with the patient   For questions or updates, please contact [facility] Please consult [email or web address] for contact info under        Signed, Julien Nordmann, MD  02/19/2023, 11:05 AM

## 2023-02-20 ENCOUNTER — Telehealth: Payer: Self-pay | Admitting: Pain Medicine

## 2023-02-20 ENCOUNTER — Encounter: Payer: Self-pay | Admitting: Internal Medicine

## 2023-02-20 DIAGNOSIS — R531 Weakness: Secondary | ICD-10-CM | POA: Diagnosis not present

## 2023-02-20 DIAGNOSIS — H5440 Blindness, one eye, unspecified eye: Secondary | ICD-10-CM | POA: Diagnosis not present

## 2023-02-20 DIAGNOSIS — I472 Ventricular tachycardia, unspecified: Secondary | ICD-10-CM | POA: Diagnosis not present

## 2023-02-20 DIAGNOSIS — H3552 Pigmentary retinal dystrophy: Secondary | ICD-10-CM

## 2023-02-20 DIAGNOSIS — Z66 Do not resuscitate: Secondary | ICD-10-CM

## 2023-02-20 LAB — GLUCOSE, CAPILLARY: Glucose-Capillary: 123 mg/dL — ABNORMAL HIGH (ref 70–99)

## 2023-02-20 MED ORDER — ASPIRIN 81 MG PO TBEC
81.0000 mg | DELAYED_RELEASE_TABLET | Freq: Every day | ORAL | Status: DC
  Administered 2023-02-20 (×2): 81 mg via ORAL
  Filled 2023-02-20: qty 1

## 2023-02-20 MED ORDER — AMIODARONE HCL 200 MG PO TABS
200.0000 mg | ORAL_TABLET | Freq: Two times a day (BID) | ORAL | Status: DC
  Administered 2023-02-20 – 2023-02-21 (×3): 200 mg via ORAL
  Filled 2023-02-20 (×4): qty 1

## 2023-02-20 NOTE — TOC Progression Note (Addendum)
Transition of Care [facility]) - Progression Note    Patient Details  Name: Aaron Gibbs MRN: [id number] Date of Birth: 1934/09/11  Transition of Care [facility]) CM/SW Contact  Kreg Shropshire, RN Phone Number: 02/20/2023, 2:44 PM  Clinical Narrative:    Cm received notification from Dr. [place] stating that pt daughter [name] would like Hospice care for her father. Cm called [name] at [phone]. Awaiting call back to discuss hospice care.  1512-Cm received call back from daughter [name]. She stated she is agreeable to hospice care at home. Cm asked if she had a preference in hospice company. She stated that pt was seen by [facility] before. She would like for [facility] to review him again for Hospice. Cm stated that [facility] has liaison her at the hospital. Cm reached out to [facility] to call daughter.      Barriers to Discharge: Continued Medical Work up  Expected Discharge Plan and Services     Post Acute Care Choice: Home Health, Skilled Nursing Facility Living arrangements for the past 2 months: Single Family Home                                       Social Determinants of Health (SDOH) Interventions SDOH Screenings   Food Insecurity: No Food Insecurity (02/18/2023)  Housing: Low Risk  (02/18/2023)  Transportation Needs: No Transportation Needs (02/18/2023)  Utilities: Not At Risk (02/18/2023)  Alcohol Screen: Low Risk  (02/02/2023)  Depression (PHQ2-9): Low Risk  (02/02/2023)  Recent Concern: Depression (PHQ2-9) - Medium Risk (12/27/2022)  Financial Resource Strain: Low Risk  (07/13/2022)  Physical Activity: Inactive (07/13/2022)  Social Connections: Socially Isolated (07/13/2022)  Stress: Stress Concern Present (07/13/2022)  Tobacco Use: Medium Risk (02/17/2023)    Readmission Risk Interventions    02/20/2023    9:47 AM  Readmission Risk Prevention Plan  Transportation Screening Complete  Medication Review (RN Care Manager) Referral to Pharmacy  PCP or  Specialist appointment within 3-5 days of discharge Complete  HRI or Home Care Consult Complete  SW Recovery Care/Counseling Consult Complete  Palliative Care Screening Not Applicable  Skilled Nursing Facility Not Applicable

## 2023-02-20 NOTE — Progress Notes (Signed)
[facility] Liaison Note:   Received a referral for Hospice services at home from [facility], [place], [place].  Spoke with patient's daughter about hospice services today.  Patient has an elevated commode seat, BSC, walker and shower chair.  Patient will most likely need home 02.  Discharge date undetermined at this time.  Patient will most likely need EMS for transport when medically stable for discharge.    Please call with any Hospice related questions or concerns.  Thank you for the opportunity to participate in this patient's care.     [facility] Liaison [phone]

## 2023-02-20 NOTE — Telephone Encounter (Signed)
Patient lvmail on Friday 02-16-23 stating he had an operation and was prescribed pain meds for pain.  It looks like he may be back in the hospital

## 2023-02-20 NOTE — Progress Notes (Signed)
Cardiology Progress Note   Patient Name: Aaron Gibbs Date of Encounter: 02/20/2023  Primary Cardiologist: None - new   Subjective   No chest pain this AM. Says that he has chronic chest pain that occurs shortly after awakening in the AM, persists throughout the day, and is worse w/ position changes.  Denies dyspnea.  Inpatient Medications    Scheduled Meds:  buprenorphine  1 patch Transdermal Weekly   carvedilol  3.125 mg Oral BID WC   Chlorhexidine Gluconate Cloth  6 each Topical Q0600   cyclobenzaprine  5 mg Oral TID   diclofenac Sodium  2 g Topical QHS   dorzolamide-timolol  1 drop Both Eyes BID   doxycycline  100 mg Oral BID   DULoxetine  90 mg Oral Daily   enoxaparin (LOVENOX) injection  40 mg Subcutaneous Q24H   ferrous sulfate  325 mg Oral Q supper   finasteride  5 mg Oral Daily   gabapentin  300 mg Oral BID   latanoprost  1 drop Both Eyes QHS   melatonin  5 mg Oral QHS   mupirocin ointment  1 Application Nasal BID   OLANZapine  2.5 mg Oral BID   pantoprazole  40 mg Oral QAC breakfast   polyethylene glycol  17 g Oral BID   sodium chloride flush  3 mL Intravenous Q12H   tamsulosin  0.4 mg Oral Daily   Continuous Infusions:  amiodarone 30 mg/hr (02/20/23 0700)   PRN Meds: acetaminophen **OR** acetaminophen, albuterol, ondansetron **OR** ondansetron (ZOFRAN) IV, oxyCODONE, traZODone   Vital Signs    Vitals:   02/20/23 0502 02/20/23 0600 02/20/23 0700 02/20/23 0724  BP: (!) 118/49 109/71 131/66   Pulse: 76 76 92   Resp: 19 (!) 21 (!) 23   Temp:    99.3 F (37.4 C)  TempSrc:    Oral  SpO2: 97% 95% 94%   Weight:      Height:        Intake/Output Summary (Last 24 hours) at 02/20/2023 0848 Last data filed at 02/20/2023 0700 Gross per 24 hour  Intake 929.84 ml  Output 900 ml  Net 29.84 ml   Filed Weights   02/17/23 1809 02/19/23 0500 02/20/23 0500  Weight: 97 kg 99 kg 99.6 kg    Physical Exam   GEN: Well nourished, well developed, in no acute  distress.  HEENT: Grossly normal.  Neck: Brace in place. Cardiac: RRR, distant, no murmurs, rubs, or gallops. No clubbing, cyanosis.  Left forearm/hand w/ 1+ edema. Radials 2+, DP/PT 2+ and equal bilaterally.  Respiratory:  Respirations regular and unlabored, scattered rhonchi. GI: Soft, nontender, nondistended, BS + x 4. MS: no deformity or atrophy. Skin: warm and dry, no rash. Neuro:  Strength and sensation are intact. Psych: AAOx3.  Normal affect.  Labs    Chemistry Recent Labs  Lab 02/17/23 2033 02/18/23 1002 02/19/23 1021  NA 139 140 141  K 4.2 4.5 4.2  CL 104 107 108  CO2 26 25 24   GLUCOSE 124* 123* 134*  BUN 22 21 16   CREATININE 1.18 1.08 1.04  CALCIUM 8.5* 8.3* 8.6*  GFRNONAA >60 >60 >60  ANIONGAP 9 8 9      Hematology Recent Labs  Lab 02/15/23 0650 02/17/23 2033 02/18/23 1002  WBC 4.1 5.2 6.3  RBC 3.29* 2.97* 3.12*  HGB 9.8* 8.6* 9.1*  HCT 31.4* 29.0* 31.2*  MCV 95.4 97.6 100.0  MCH 29.8 29.0 29.2  MCHC 31.2 29.7* 29.2*  RDW 15.9* 16.0*  15.9*  PLT 153 137* 122*    Cardiac Enzymes  Recent Labs  Lab 02/17/23 1837 02/17/23 2033  TROPONINIHS 14 15      BNP    Component Value Date/Time   BNP 156.0 (H) 05/27/2022 0801    DDimer  Recent Labs  Lab 02/17/23 2034  DDIMER 1.28*     Lipids  Lab Results  Component Value Date   TRIG 228 (H) 03/30/2019    HbA1c  Lab Results  Component Value Date   HGBA1C 5.4 12/13/2019    Radiology    CT ANGIO HEAD NECK W WO CM  Result Date: 02/19/2023 CLINICAL DATA:  Headache, secondary (Ped 0-17y) EXAM: CT ANGIOGRAPHY HEAD AND NECK WITH AND WITHOUT CONTRAST TECHNIQUE: Multidetector CT imaging of the head and neck was performed using the standard protocol during bolus administration of intravenous contrast. Multiplanar CT image reconstructions and MIPs were obtained to evaluate the vascular anatomy. Carotid stenosis measurements (when applicable) are obtained utilizing NASCET criteria, using the distal  internal carotid diameter as the denominator. RADIATION DOSE REDUCTION: This exam was performed according to the departmental dose-optimization program which includes automated exposure control, adjustment of the mA and/or kV according to patient size and/or use of iterative reconstruction technique. CONTRAST:  75mL OMNIPAQUE IOHEXOL 350 MG/ML SOLN COMPARISON:  CT head June 11 24. CTA head/neck May 8 24. FINDINGS: Limited study due to patient positioning (patient unable to lay on their back because of neck pain). Within this limitation: CT HEAD FINDINGS Brain: No evidence of acute infarction, hemorrhage, hydrocephalus, extra-axial collection or mass lesion/mass effect. Patchy white matter hypodensities are nonspecific but compatible with chronic microvascular ischemic change. Vascular: See below. Skull: No acute fracture. Sinuses/Orbits: Moderate paranasal sinus mucosal thickening. No acute orbital findings. Other: No mastoid effusions. Review of the MIP images confirms the above findings CTA NECK FINDINGS Aortic arch: Great vessel origins are patent. Aortic atherosclerosis. Right carotid system: Atherosclerosis at the carotid bifurcation without greater than 50% stenosis. Left carotid system: Atherosclerosis at the carotid bifurcation without greater than 50% stenosis. Vertebral arteries: Chronically occluded distal right V2 vertebral artery with or distal faint reconstitution at the V3 segment. Left vertebral artery is patent in the neck. Skeleton: Chronic C2 dens fracture. Other neck: No acute abnormality on limited assessment. Upper chest: Partially imaged right pleural effusion with overlying opacities, better characterized on recent CT chest. Review of the MIP images confirms the above findings CTA HEAD FINDINGS Anterior circulation: Bilateral intracranial ICAs, MCAs, and ACAs are patent proximally. Posterior circulation: Faint V3/V4 reconstitution with new/interval severe stenosis or occlusion of the  intradural right vertebral artery. Left vertebral artery remains patent. The basilar artery and bilateral posterior cerebral arteries are patent without proximal high-grade stenosis. Venous sinuses: As permitted by contrast timing, patent. Review of the MIP images confirms the above findings IMPRESSION: 1. Chronic right V2 vertebral artery occlusion. There is faint V3/V4 reconstitution with new/interval severe stenosis or occlusion of the intradural right vertebral artery. Left vertebral artery remains patent. 2. Chronic C2 fracture. 3. Partially imaged right pleural effusion with overlying opacities, better characterized on recent CT chest. 4. Limited study due to patient positioning. Electronically Signed   By: Feliberto Harts M.D.   On: 02/19/2023 13:12   CT Angio Chest Pulmonary Embolism (PE) W or WO Contrast  Result Date: 02/18/2023 CLINICAL DATA:  Generalized weakness. Unable to ambulate as well as normal. Melanoma excision with sentinel lymph node biopsy on the left on 02/15/2023. PE suspected. Abdominal aortic aneurysm  follow-up. EXAM: CT ANGIOGRAPHY CHEST, ABDOMEN AND PELVIS TECHNIQUE: Non-contrast CT of the chest was initially obtained. Multidetector CT imaging through the chest, abdomen and pelvis was performed using the standard protocol during bolus administration of intravenous contrast. Multiplanar reconstructed images and MIPs were obtained and reviewed to evaluate the vascular anatomy. RADIATION DOSE REDUCTION: This exam was performed according to the departmental dose-optimization program which includes automated exposure control, adjustment of the mA and/or kV according to patient size and/or use of iterative reconstruction technique. CONTRAST:  OMNIPAQUE IOHEXOL 350 MG/ML SOLN COMPARISON:  Chest radiograph 02/17/2023; PET/CT 12/28/2022; CT abdomen and pelvis 10/17/2022; CT chest abdomen and pelvis 10/10/2022 FINDINGS: CTA CHEST FINDINGS Cardiovascular: Negative for acute pulmonary  embolism. Normal caliber thoracic aorta without dissection. Coronary artery and aortic atherosclerotic calcification. No pericardial effusion. Right PICC tip in the low SVC. Mediastinum/Nodes: No thoracic adenopathy. Unremarkable trachea and esophagus. Lungs/Pleura: Bronchial wall thickening and scattered mucous plugging greatest in the right lower lobe. Bilateral lower lung bronchiolectasis. Scattered centrilobular and budding tree micro nodules greatest in the lingula. Decreased size of the thick-walled cavitary mass in the lingula compared with PET/CT 12/28/2022. This measures 3.2 x 1.7 cm today, previously 3.2 x 2.5 cm. Small right pleural effusion and associated atelectasis. No pneumothorax. Musculoskeletal: Subcutaneous emphysema in the anterolateral left chest wall. Loosely organized left axillary postprocedural hematoma/seroma measuring 4.9 x 2.2 x 4.6 cm related to recent axillary node biopsy. Thoracic kyphosis. No acute fracture. Fused anterior enthesophytes throughout the cervical and thoracic spine. Review of the MIP images confirms the above findings. CTA ABDOMEN AND PELVIS FINDINGS VASCULAR Aorta: Infrarenal abdominal aortic aneurysm again measuring up to 5.0 cm in greatest diameter. Aortic calcified atherosclerosis. No dissection. Celiac: No hemodynamically significant stenosis, aneurysm, or dissection. SMA: No hemodynamically significant stenosis, aneurysm, or dissection. Renals: No hemodynamically significant stenosis, aneurysm, or dissection. IMA: Patent. Inflow: Calcified atherosclerosis without significant stenosis. No aneurysm or dissection. Veins: No obvious venous abnormality within the limitations of this arterial phase study. Review of the MIP images confirms the above findings. NON-VASCULAR Hepatobiliary: Cholecystectomy. No focal hepatic lesion. Unchanged biliary prominence. Pancreas: Fatty atrophy.  No acute abnormality. Spleen: Unremarkable. Adrenals/Urinary Tract: Stable adrenal glands.  No urinary calculi or hydronephrosis. Unchanged 1.4 cm cystic lesion in the posterior right kidney. Unremarkable bladder. Stomach/Bowel: Normal caliber large and small bowel. Colonic diverticulosis without diverticulitis. Stomach is within normal limits. Normal appendix. Lymphatic: No lymphadenopathy. Reproductive: Unremarkable. Other: No free intraperitoneal fluid or air. Musculoskeletal: Demineralization. Bilateral femoral head AVN. Fused anterior syndesmophytes in the lumbar spine. Review of the MIP images confirms the above findings. IMPRESSION: 1. Negative for acute pulmonary embolism. 2. Infrarenal abdominal aortic aneurysm again measuring up to 5.0 cm in greatest diameter. Recommend follow-up CT/MR every 6 months and vascular consultation if not obtained. This recommendation follows ACR consensus guidelines: White Paper of the ACR Incidental Findings Committee II on Vascular Findings. J Am Coll Radiol 2013; 10:789-794. 3. Decreased size of the cavitary lesion in the lingula since PET/CT 12/28/2022. 4. Pulmonary findings compatible with infectious/inflammatory bronchiolitis. 5. Small right pleural effusion and associated atelectasis. 6. Loosely organized left axillary postprocedural hematoma/seroma measuring 4.9 x 2.2 x 4.6 cm related to recent axillary node biopsy. 7. 1.4 cm cystic lesion in the posterior right kidney. If clinically appropriate, renal mass protocol MRI could be obtained for further evaluation. Aortic Atherosclerosis (ICD10-I70.0). Electronically Signed   By: Minerva Fester M.D.   On: 02/18/2023 01:03   CT Angio Abd/Pel w/ and/or w/o  Result  Date: 02/18/2023 CLINICAL DATA:  Generalized weakness. Unable to ambulate as well as normal. Melanoma excision with sentinel lymph node biopsy on the left on 02/15/2023. PE suspected. Abdominal aortic aneurysm follow-up. EXAM: CT ANGIOGRAPHY CHEST, ABDOMEN AND PELVIS TECHNIQUE: Non-contrast CT of the chest was initially obtained. Multidetector CT  imaging through the chest, abdomen and pelvis was performed using the standard protocol during bolus administration of intravenous contrast. Multiplanar reconstructed images and MIPs were obtained and reviewed to evaluate the vascular anatomy. RADIATION DOSE REDUCTION: This exam was performed according to the departmental dose-optimization program which includes automated exposure control, adjustment of the mA and/or kV according to patient size and/or use of iterative reconstruction technique. CONTRAST:  OMNIPAQUE IOHEXOL 350 MG/ML SOLN COMPARISON:  Chest radiograph 02/17/2023; PET/CT 12/28/2022; CT abdomen and pelvis 10/17/2022; CT chest abdomen and pelvis 10/10/2022 FINDINGS: CTA CHEST FINDINGS Cardiovascular: Negative for acute pulmonary embolism. Normal caliber thoracic aorta without dissection. Coronary artery and aortic atherosclerotic calcification. No pericardial effusion. Right PICC tip in the low SVC. Mediastinum/Nodes: No thoracic adenopathy. Unremarkable trachea and esophagus. Lungs/Pleura: Bronchial wall thickening and scattered mucous plugging greatest in the right lower lobe. Bilateral lower lung bronchiolectasis. Scattered centrilobular and budding tree micro nodules greatest in the lingula. Decreased size of the thick-walled cavitary mass in the lingula compared with PET/CT 12/28/2022. This measures 3.2 x 1.7 cm today, previously 3.2 x 2.5 cm. Small right pleural effusion and associated atelectasis. No pneumothorax. Musculoskeletal: Subcutaneous emphysema in the anterolateral left chest wall. Loosely organized left axillary postprocedural hematoma/seroma measuring 4.9 x 2.2 x 4.6 cm related to recent axillary node biopsy. Thoracic kyphosis. No acute fracture. Fused anterior enthesophytes throughout the cervical and thoracic spine. Review of the MIP images confirms the above findings. CTA ABDOMEN AND PELVIS FINDINGS VASCULAR Aorta: Infrarenal abdominal aortic aneurysm again measuring up to 5.0  cm in greatest diameter. Aortic calcified atherosclerosis. No dissection. Celiac: No hemodynamically significant stenosis, aneurysm, or dissection. SMA: No hemodynamically significant stenosis, aneurysm, or dissection. Renals: No hemodynamically significant stenosis, aneurysm, or dissection. IMA: Patent. Inflow: Calcified atherosclerosis without significant stenosis. No aneurysm or dissection. Veins: No obvious venous abnormality within the limitations of this arterial phase study. Review of the MIP images confirms the above findings. NON-VASCULAR Hepatobiliary: Cholecystectomy. No focal hepatic lesion. Unchanged biliary prominence. Pancreas: Fatty atrophy.  No acute abnormality. Spleen: Unremarkable. Adrenals/Urinary Tract: Stable adrenal glands. No urinary calculi or hydronephrosis. Unchanged 1.4 cm cystic lesion in the posterior right kidney. Unremarkable bladder. Stomach/Bowel: Normal caliber large and small bowel. Colonic diverticulosis without diverticulitis. Stomach is within normal limits. Normal appendix. Lymphatic: No lymphadenopathy. Reproductive: Unremarkable. Other: No free intraperitoneal fluid or air. Musculoskeletal: Demineralization. Bilateral femoral head AVN. Fused anterior syndesmophytes in the lumbar spine. Review of the MIP images confirms the above findings. IMPRESSION: 1. Negative for acute pulmonary embolism. 2. Infrarenal abdominal aortic aneurysm again measuring up to 5.0 cm in greatest diameter. Recommend follow-up CT/MR every 6 months and vascular consultation if not obtained. This recommendation follows ACR consensus guidelines: White Paper of the ACR Incidental Findings Committee II on Vascular Findings. J Am Coll Radiol 2013; 10:789-794. 3. Decreased size of the cavitary lesion in the lingula since PET/CT 12/28/2022. 4. Pulmonary findings compatible with infectious/inflammatory bronchiolitis. 5. Small right pleural effusion and associated atelectasis. 6. Loosely organized left  axillary postprocedural hematoma/seroma measuring 4.9 x 2.2 x 4.6 cm related to recent axillary node biopsy. 7. 1.4 cm cystic lesion in the posterior right kidney. If clinically appropriate, renal mass protocol MRI could  be obtained for further evaluation. Aortic Atherosclerosis (ICD10-I70.0). Electronically Signed   By: Minerva Fester M.D.   On: 02/18/2023 01:03   DG Chest Port 1 View  Result Date: 02/17/2023 CLINICAL DATA:  Hypoxia. EXAM: PORTABLE CHEST 1 VIEW COMPARISON:  Chest radiograph dated 01/11/2023. FINDINGS: The heart size and mediastinal contours are within normal limits. There is mild right basilar atelectasis/airspace disease. A small right pleural effusion may contribute. Left interstitial and airspace opacities in the left mid lung appear similar to prior exam and may reflect an incompletely resolved cavitary lesion. No left pleural effusion. No pneumothorax on either side. A right upper extremity peripherally inserted central venous catheter tip overlies the superior cavoatrial junction. Degenerative changes are seen in the spine. IMPRESSION: 1. Mild right basilar atelectasis/airspace disease. A small right pleural effusion may contribute. 2. Left interstitial and airspace opacities in the left mid lung appear similar to prior exam and may reflect an incompletely resolved cavitary lesion. Electronically Signed   By: Romona Curls M.D.   On: 02/17/2023 20:08    Telemetry    RSR - Personally Reviewed  Cardiac Studies   2D Echocardiogram 9.1.2024  1. Left ventricular ejection fraction, by estimation, is 55 to 60%. The  left ventricle has normal function. The left ventricle has no regional  wall motion abnormalities. Left ventricular diastolic parameters are  indeterminate.   2. Right ventricular systolic function is normal. The right ventricular  size is normal. Tricuspid regurgitation signal is inadequate for assessing  PA pressure.   3. Left atrial size was moderately dilated.    4. The mitral valve is grossly normal. Trivial mitral valve  regurgitation. No evidence of mitral stenosis.   5. The aortic valve was not well visualized. There is mild calcification  of the aortic valve. There is mild thickening of the aortic valve. Aortic  valve regurgitation is mild to moderate. Aortic valve  sclerosis/calcification is present, without any  evidence of aortic stenosis.   6. Aortic dilatation noted. There is borderline dilatation of the aortic  root, measuring 40 mm.  _____________   Patient Profile     88 y.o. male w/ a h/o blindness, chronic pain (chronic opioids), COPD, HFpEF, depression/anxiety, HTN, AAA (5cm), traumatic C2 fracture (09/2022) w/ ongoing neck pain, metastatic melanoma (forearms), and obesity, who was admitted 8/31 with weakness and malaise and was noted to have runs of VT assoc w/ hypotension.  Assessment & Plan    1.  Ventricular Tachycardia:  Presented 8/31 w/ weakness/malaise, and was noted to have runs of VT in the ED  Better w/ IV amio.  Normal hsTrops 14  15.  Echo w/ nl LV fxn.  Maintaining sinus rhythm on tele w/o recurrent ventricular ectopy.  Will transition amio to 200 bid.  Cont low-dose ? blocker (h/o bradycardia in the past - follow).  Reports prior h/o chest pain - chronic, daily, worse w/ position changes.  No clear angina.  Poor candidate for ischemic eval in setting of chronic pain/neck pain, preventing pt from lying still for nuc study or cath.  Follow up lipids w/ low threshold to add statin.  2.  Essential HTN:  Stable.  Cont low dose ? blocker.  3.  Infrarenal AAA:  5 cm on CT.  Plan f/u w/ vascular surgery.  BP controlled.  4.  Cervical fracture/neck pain:  chronic C2 fracture on CT.  Brace in place.  Pain mgmt per medicine team.  5.  Vertebral artery dzs:  Seen by neuro.  Conservative rx.  6.  L axillary hematoma/seroma:  s/p lymph node biopsy w/ assoc swelling.  H/H stable.  Signed, Nicolasa Ducking, NP  02/20/2023, 8:48  AM    For questions or updates, please contact   Please consult [email or web address] for contact info under Cardiology/STEMI.

## 2023-02-20 NOTE — TOC Initial Note (Addendum)
Transition of Care [facility]) - Initial/Assessment Note    Patient Details  Name: Aaron Gibbs MRN: [id number] Date of Birth: 1934-09-10  Transition of Care [facility]) CM/SW Contact:    Kreg Shropshire, RN Phone Number: 02/20/2023, 9:45 AM  Clinical Narrative:                 Cm spoke with pt daughter [name]. Full time caregiver Candice at the bedside.  Pt arrived from ED from: Home Caregiver Support: Full time caregiver Candice DME at Home: Wheelchair and chair Transportation: Ambulance Previous Services: [facility] HH/SNF Preference: [facility] First Person of Contact: [name] Daughter PCP: Saralyn Pilar, MD  Readmission prevention screening completed.  Cm will continue to follow for toc needs and d/c planning.     Barriers to Discharge: Continued Medical Work up   Patient Goals and CMS Choice   CMS Medicare.gov Compare Post Acute Care list provided to:: Patient Represenative (must comment) Choice offered to / list presented to : Adult Children      Expected Discharge Plan and Services     Post Acute Care Choice: Home Health, Skilled Nursing Facility Living arrangements for the past 2 months: Single Family Home                                      Prior Living Arrangements/Services Living arrangements for the past 2 months: Single Family Home Lives with:: Self, Other (Comment) (full time caregiver)          Need for Family Participation in Patient Care: Yes (Comment) Care giver support system in place?: Yes (comment) Current home services: DME    Activities of Daily Living Home Assistive Devices/Equipment: Environmental consultant (specify type), Wheelchair, C-collar, Shower chair with back, Grab bars in shower, Grab bars around toilet ADL Screening (condition at time of admission) Patient's cognitive ability adequate to safely complete daily activities?: Yes Is the patient deaf or have difficulty hearing?: No Does the patient have difficulty seeing,  even when wearing glasses/contacts?: Yes Does the patient have difficulty concentrating, remembering, or making decisions?: No Patient able to express need for assistance with ADLs?: Yes Does the patient have difficulty dressing or bathing?: Yes Independently performs ADLs?: No Communication: Independent Does the patient have difficulty walking or climbing stairs?: Yes Weakness of Legs: Both Weakness of Arms/Hands: None  Permission Sought/Granted                  Emotional Assessment              Admission diagnosis:  Ventricular tachycardia (HCC) [I47.20] Generalized weakness [R53.1] NSVT (nonsustained ventricular tachycardia) (HCC) [I47.29] Patient Active Problem List   Diagnosis Date Noted   Ventricular tachycardia (HCC) 02/17/2023   Hypotension 02/17/2023   S/P PICC central line placement 02/17/2023   Ankylosing spondylitis lumbar region (HCC) 12/27/2022   Traumatic closed fracture of C2 vertebra with minimal displacement, sequela 12/27/2022   Cervicalgia 12/27/2022   Decreased functional activity tolerance 12/15/2022   Lymphedema of arm 12/15/2022   Melanoma in situ of trunk (HCC) 12/15/2022   COPD (chronic obstructive pulmonary disease) (HCC) 11/09/2022   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 (HCC) 11/09/2022   DNR (do not resuscitate) 10/17/2022   AKI (acute kidney injury) (HCC) 10/17/2022   C2 cervical fracture (HCC) 10/17/2022   Infrarenal abdominal aortic aneurysm (AAA) without rupture (HCC) 10/17/2022   Hypokalemia 10/17/2022  COVID-19 10/11/2022   Thrombocytopenia (HCC) 10/11/2022   Chronic heart failure with preserved ejection fraction (HCC) 09/13/2022   Palliative care patient 07/17/2022   Anemia 06/09/2022   Unable to maintain body in lying position 05/24/2022   Benign paroxysmal positional vertigo    Generalized weakness 01/29/2022   Sensorineural hearing loss (SNHL) of both ears 07/25/2021    Pain in right knee 03/15/2021   Other chronic pain 03/15/2021   Chronic knee pain (Left) 09/28/2020   Abnormal MRI, knee (08/05/2018) (Left) 09/13/2020   Long-term current use of benzodiazepine 08/09/2020   Opioid dependence, binge pattern (HCC) 08/09/2020   Opioid dependence with or without physiological dependence (HCC) 08/08/2020   History of substance use disorder 04/15/2020   Osteoarthritis of knee (Left) 03/23/2020   Aortic atherosclerosis (HCC) 02/05/2020   Generalized anxiety disorder with panic attacks 02/04/2020   Depression with anxiety 12/13/2019   Depression 12/12/2019   HLD (hyperlipidemia)    Iron deficiency anemia    Fall at home, initial encounter    Medial meniscal tear, sequela (Left) 06/10/2019   Patellar tendinosis (Right) 06/10/2019   Lateral meniscal tear, sequela (Left) 03/03/2019   Palpitation 02/13/2019   Preop testing 11/14/2018   Noncompliance with medication treatment due to overuse of medication 10/23/2018   Neurogenic pain 08/20/2018   Atherosclerotic peripheral vascular disease (HCC) 07/24/2018   Tricompartment osteoarthritis of knee (Left) 07/24/2018   Osteoarthritis of knee (Bilateral) 07/24/2018   Osteoarthritis of patellofemoral joint (Right) 07/24/2018   History of suicide attempt (06/12/18) 07/24/2018   Long term current use of opiate analgesic 07/10/2018   Pharmacologic therapy 07/10/2018   Disorder of skeletal system 07/10/2018   Problems influencing health status 07/10/2018   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   Major depressive disorder, recurrent episode, severe (HCC) 06/04/2018   Blindness 05/10/2018   Chronic pain syndrome 05/01/2018   DISH (diffuse idiopathic skeletal hyperostosis) 05/01/2018   Osteoarthritis of multiple joints 05/01/2018   Chronic knee pain (1ry area of Pain) (Bilateral) (L>R) 05/01/2018   Retinitis pigmentosa of both eyes 05/01/2018   Glaucoma of both eyes 05/01/2018   Essential hypertension 05/01/2018    Chronic low back pain (Bilateral) w/o sciatica 05/01/2018   GERD (gastroesophageal reflux disease) 05/01/2018   AVM (arteriovenous malformation) of colon 05/01/2018   Therapeutic opioid-induced constipation (OIC) 05/01/2018   PCP:  Smitty Cords, DO Pharmacy:   [facility] [phone] [place], [place] - [address] AT [facility] OF [address] [place] [address] Phone: [phone] Fax: [phone]     Social Determinants of Health (SDOH) Social History: SDOH Screenings   Food Insecurity: No Food Insecurity (02/18/2023)  Housing: Low Risk  (02/18/2023)  Transportation Needs: No Transportation Needs (02/18/2023)  Utilities: Not At Risk (02/18/2023)  Alcohol Screen: Low Risk  (02/02/2023)  Depression (PHQ2-9): Low Risk  (02/02/2023)  Recent Concern: Depression (PHQ2-9) - Medium Risk (12/27/2022)  Financial Resource Strain: Low Risk  (07/13/2022)  Physical Activity: Inactive (07/13/2022)  Social Connections: Socially Isolated (07/13/2022)  Stress: Stress Concern Present (07/13/2022)  Tobacco Use: Medium Risk (02/17/2023)   SDOH Interventions:     Readmission Risk Interventions     No data to display

## 2023-02-20 NOTE — Consult Note (Signed)
Consultation Note Date: 02/20/2023 at 1230  Patient Name: Aaron Gibbs  DOB: [birth year]  MRN: [id number]  Age / Sex: 88 y.o., male  PCP: Aaron Cords, DO Referring Physician: Lucile Shutters, MD  Reason for Consultation: Establishing goals of care  HPI/Patient Profile: 88 y.o. male  with past medical history of blindness (retinitis pigmentosa), chronic pain (chronic opiates), COPD, diastolic CHF, depression/anxiety, history of traumatic C2 fracture (April 2024), and recent metastatic melanoma resection (02/15/2023) with PICC placement admitted on 02/17/2023 with weakness.  Patient is being treated for NSVT, C2 traumatic closed fracture, infrarenal abdominal aortic aneurysm, chronic HFpEF, and depression.  PMT was consulted to discuss goals of care.  Of note, patient and family are familiar to PMT as PMT was consulted during his May 2024 hospitalization [facility]).  Clinical Assessment and Goals of Care: I have reviewed medical records including EPIC notes, labs and imaging, assessed the patient and then met with patient at bedside. He is awake and alert, but minimally interactive during out discussion. He confirmed he is not experiencing any pain/discomfort/constipation, or acute issues at this time. He requests that I let him rest. He confirmed that I could discuss his POC with his daughter. No family/friends present druing my visit.   After assessing the patient and speaking with dayshift RN Aaron Land, I spoke with patient's daughter Aaron Gibbs over the phone. We discussed diagnosis prognosis, GOC, EOL wishes, disposition, and options.  I re-introduced Palliative Medicine as specialized medical care for people living with serious illness. It focuses on providing relief from the symptoms and stress of a serious illness. The goal is to improve quality of life for both the patient and the family.    We  discussed patient's current illness and what it means in the larger context of patient's on-going co-morbidities.  Discussed patient's melanoma, chronic pain, poor functional status, and overall quality of life.    I attempted to elicit values and goals of care important to the patient.  Aaron Gibbs shares that she wants to do what ever is necessary to ensure her father is in minimal pain.  She is concerned that she will not be able to continue with treating his melanoma and cardiac issues while also ensuring his pain is minimized.   The difference between aggressive medical intervention and comfort care was considered in light of the patient's goals of care.  I outlined that though patient is not at EOL his plan of care could shift from managing his disease processes and attempts to cure to managing his symptom and experience of his disease process.   We discussed comfort focused care. Aaron Gibbs would no longer receive aggressive medical interventions such as continuous vital signs, lab work, radiology testing, or medications not focused on comfort. All care would focus on how he is looking and feeling.   The difference between palliative and hospice services discussed.   Hospice philosophy, aging in place, comfort focused care and treating the treatable discussed. I highlighted that enacting hospice benefits does not mean that patient is  imminently approaching end-of-life.  The goal would be to more aggressively manage his symptoms, avoid frequent doctors visits/hospitalizations (which Aaron Gibbs shares takes a tremendous amount of effort and energy out of the patient), and would allow the patient to age in place with a focus on quality of life.  Aaron Gibbs shares she is open to enacting hospice benefits for her father and plans to speak with him about it this week.   Advance directives, concepts specific to code status, artificial feeding and hydration, and rehospitalization were considered and discussed.  I clarified  patient's wishes. I referenced MOST form and DNR completed and in chart. Aaron Gibbs is clear that patient would never want to be placed on a ventilator.  DNR with limited interventions vs DNR with full interventions discussed in detail. Aaron Gibbs confirmed patient's code status as a DNR with limited interventions. Adjustment made to code status in Epic.    Discussed with Aaron Gibbs the importance of continued conversation with family and the medical providers regarding overall plan of care and treatment options, ensuring decisions are within the context of the patient's values and GOCs.    Questions and concerns were addressed. Aaron Gibbs was encouraged to call with questions or concerns.   PMT will continue to follow.   Primary Decision Maker NEXT OF KIN  Physical Exam Vitals reviewed.  Constitutional:      General: He is not in acute distress.    Appearance: He is normal weight.  HENT:     Head: Normocephalic.     Mouth/Throat:     Mouth: Mucous membranes are moist.  Pulmonary:     Effort: Pulmonary effort is normal.  Abdominal:     Palpations: Abdomen is soft.  Skin:    General: Skin is warm and dry.     Coloration: Skin is pale.  Neurological:     Mental Status: He is alert.     Comments: Oriented to self, sleepy/unable to stay awake to respond to all orientation questions  Psychiatric:        Mood and Affect: Mood normal.        Behavior: Behavior normal.     Palliative Assessment/Data: 50%     Thank you for this consult. Palliative medicine will continue to follow and assist holistically.   Time Total: 75 minutes  Signed by: Aaron Cocker, DNP, FNP-BC Palliative Medicine    Please contact Palliative Medicine Team phone at [phone] for questions and concerns.  For individual provider: See Loretha Stapler

## 2023-02-20 NOTE — Progress Notes (Signed)
Progress Note   Patient: Aaron Gibbs MVH:[id number] DOB: [birth year] DOA: 02/17/2023     3 DOS: the patient was seen and examined on 02/20/2023   Brief hospital course:  Rameir Ivanova is a 88 y.o. 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  At baseline patient is able to ambulate but needed assistance.  He has had no nausea, vomiting, abdominal pain, diarrhea or dysuria or cough, fever or chills or shortness of breath.  Denies chest pain, lower extremity pain or swelling. ED course and data review: Briefly hypoxic to 89 with otherwise normal vitals.  CBC and BMP notable for hemoglobin of 8.6 down from 9.82 days ago on the day of resection and 11.7 a month ago.  WBC normal and lactic acid 0.6. Potassium and magnesium normal Lactic acid 0.6 Troponin 15 EKG #1 ectopic atrial rhythm at 67 Chest x-ray showed small right pleural effusion but otherwise unchanged findings from 01/11/2023   While in the emergency room patient went into asymptomatic V. tach with episodes lasting 15 to 30 seconds with reading EKG #2 showed sinus tachycardia at 125 with atrial premature complex and LBBB   Patient was loaded with amiodarone bolus followed by infusion  He had a hypotensive response to Amio with systolic going as low as the 70s but was fluid responsive to 121/51 at the time of admission  Patient subsequently remained stable Hospitalist consulted for admission.    09/01 -patient is seen and examined at the bedside. Complains of pain in his neck  and in his left forearm. Remains on amiodarone drip    09/02 -CT angiogram of the head and neck done for evaluation of worsening back pain   09/03 -transition to oral amiodarone.  Will transfer out of stepdown unit     Assessment and Plan:   * NSVT (nonsustained ventricular tachycardia) (HCC) Patient with repeated episodes of nonsustained V. tach in the ED lasting up to 30 seconds and symptomatic with an episode of hypotension that responded to IV fluid resuscitation Converted to sinus with amiodarone bolus.  Remains on amiodarone infusion and will be transition to oral amiodarone in a.m. 2D echocardiogram shows an LVEF of 55 to 60% with normal LV function.  No regional wall motion abnormality.  Aortic dilatation noted, borderline dilatation of the aortic root measuring 40 mm. Appreciate cardiology input.  Initial plan was for Lexiscan stress test for ischemic evaluation but patient has severe neck pain and is unable to lay flat for any testing at this time.  Has a known history of a C2 fracture and has a cervical collar in place.  No plans for further ischemic testing at this time. Continue carvedilol 3.125 mg twice daily Will transfer patient out of stepdown       Hypotension Improved.  Resume amlodipine and olmesartan. Continue carvedilol Continue to hold Bumex patient appears euvolemic at this time       Generalized weakness Probably related to hypotension and bradycardia No stigmata of infection Discussed with caregiver at the bedside and patient is usually able to transfer using a walker into his  wheelchair but was unable to do that on the day of admission. 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 (HCC) PICC line in place for venous access from recent procedure on 8/29 Patient had PICC line placed due to poor venous access  Will keep PICC line for now No acute issues  related to wound suspected Wound care consult Continue outpatient doxycycline    Anemia of chronic disease Patient noted to be anemic with hemoglobin 8.6 Review of records reveals that he had significant bleeding from left forearm lesion prior to resection H&H appears stable Continue home iron     Traumatic closed fracture of C2 vertebra with minimal displacement, sequela Neurosurgery consult due to worsening neck pain Discussed with Dr. Katrinka Blazing who recommends CT angiogram of the head and neck which shows chronic right V2 vertebral artery occlusion. There is faint V3/V4 reconstitution with new/interval severe stenosis or occlusion of the intradural right vertebral artery. Left vertebral artery remains patent. Chronic C2 fracture. Continue c-collar Pain control.  Increase hydrocodone to 10 mg p.o. every 6 hours and start patient on muscle relaxant Neurosurgery recommends conservative treatment for now     Infrarenal abdominal aortic aneurysm (AAA) without rupture (HCC) Seen by vascular, Dr. Wyn Quaker on 8/27 who is arranging preoperative clearance for endovascular repair. Last aneurysm size was 5 cm Patient will benefit from ischemic evaluation by cardiology prior to endovascular repair.   Chronic heart failure with preserved ejection fraction (HCC) Clinically euvolemic Will resume olmesartan Continue carvedilol Bumex remains on hold since patient appears euvolemic.    Depression with anxiety Continue trazodone and duloxetine and Zyprexa   COPD (chronic obstructive pulmonary disease) (HCC) Not acutely exacerbated Continue home inhalers with DuoNebs as needed   Long term current use of opiate analgesic Continue Butrans patch, Voltaren gel, oxycodone   Blindness Retinitis pigmentosa of both eyes Increase nursing per protocol for blindness Continue eyedrops oxycodone    Severe stenosis/occlusion right vertebral artery Appreciate neurology input Per neurology no indication to  start aspirin in asymptomatic intracranial stenosis due to increased risk for bleeding.  If patient becomes symptomatic then will need to be started on aspirin.     Subjective: Sleeping but arouses easily.  Physical Exam: Vitals:   02/20/23 0900 02/20/23 1000 02/20/23 1100 02/20/23 1200  BP: (!) 100/45 115/63 (!) 129/51 (!) 143/53  Pulse: 71 65 66 64  Resp: 19 18 17  (!) 23  Temp:    99 F (37.2 C)  TempSrc:    Axillary  SpO2: 94% 96% 96% 96%  Weight:      Height:        Vitals and nursing note reviewed.  Constitutional:      General: He is sleeping. He is not in acute distress.    Comments: Frail-appearing elderly male  HENT:     Head: Normocephalic and atraumatic.  Neck:     Comments: Soft cervical collar Cardiovascular:     Rate and Rhythm: Normal rate and regular rhythm.     Heart sounds: Normal heart sounds.  Pulmonary:     Effort: Pulmonary effort is normal.     Breath sounds: Normal breath sounds.  Abdominal:     Palpations: Abdomen is soft.     Tenderness: There is no abdominal tenderness.  Musculoskeletal:     Comments: Left forearm in bandage from recent procedure  Neurological:     Mental Status: He is easily aroused.  Mental status is at baseline. He is awake and alert.   Data Reviewed: Labs reviewed. There are no new results to review at this time.  Family Communication: Called and discussed patient's condition and plan of care and his daughter [name] over the phone.  All questions and concerns have been addressed.  She wishes to speak to [facility] about hospice on discharge.  Disposition: Status is: Inpatient Remains inpatient appropriate because: Discharge planning  Planned Discharge Destination:  TBD    Time spent: 35 minutes  Author: Lucile Shutters, MD 02/20/2023 2:39 PM  For on call review [email or web address].

## 2023-02-20 NOTE — Plan of Care (Addendum)
I have reviewed his CTA, compared it to the prior CTA neck and correlated my findings with the Radiology report. The right intracranial vertebral artery finding is new but is more consistent with severe stenosis rather than occlusion. Most likely secondary to intracranial atherosclerosis. I see nothing in his history about new neurological symptoms. Therefore the vertebral artery finding would be classifiable as asymptomatic (asymptomatic intracranial atherosclerosis, or asymptomatic ICAS). Prevalence of asymptomatic intracranial stenosis is quite high in this [place], most of the patients not progressing to having a stroke. Per the literature, there is a low risk of stroke attributed to asymptomatic stenosis of <1% over 10 years. There is therefore no indication to start ASA in asymptomatic ICAS given the bleeding risk. If the stenosis becomes symptomatic, then the patient should have further work up and most likely would need to be started on ASA.  Electronically signed: Dr. Caryl Pina

## 2023-02-21 DIAGNOSIS — F418 Other specified anxiety disorders: Secondary | ICD-10-CM | POA: Diagnosis not present

## 2023-02-21 DIAGNOSIS — I472 Ventricular tachycardia, unspecified: Secondary | ICD-10-CM | POA: Diagnosis not present

## 2023-02-21 DIAGNOSIS — J441 Chronic obstructive pulmonary disease with (acute) exacerbation: Secondary | ICD-10-CM | POA: Diagnosis not present

## 2023-02-21 DIAGNOSIS — R531 Weakness: Secondary | ICD-10-CM | POA: Diagnosis not present

## 2023-02-21 DIAGNOSIS — Z515 Encounter for palliative care: Secondary | ICD-10-CM

## 2023-02-21 LAB — BASIC METABOLIC PANEL
Anion gap: 8 (ref 5–15)
BUN: 16 mg/dL (ref 8–23)
CO2: 25 mmol/L (ref 22–32)
Calcium: 8.6 mg/dL — ABNORMAL LOW (ref 8.9–10.3)
Chloride: 105 mmol/L (ref 98–111)
Creatinine, Ser: 1.18 mg/dL (ref 0.61–1.24)
GFR, Estimated: >60 mL/min (ref >60)
Glucose, Bld: 114 mg/dL — ABNORMAL HIGH (ref 70–99)
Potassium: 3.9 mmol/L (ref 3.5–5.1)
Sodium: 138 mmol/L (ref 135–145)

## 2023-02-21 LAB — LIPID PANEL
Cholesterol: 118 mg/dL (ref 0–200)
HDL: 28 mg/dL — ABNORMAL LOW (ref >40)
LDL Cholesterol: 66 mg/dL (ref 0–99)
Total CHOL/HDL Ratio: 4.2 ratio
Triglycerides: 121 mg/dL (ref <150)
VLDL: 24 mg/dL (ref 0–40)

## 2023-02-21 LAB — MAGNESIUM: Magnesium: 2.4 mg/dL (ref 1.7–2.4)

## 2023-02-21 LAB — GLUCOSE, CAPILLARY: Glucose-Capillary: 106 mg/dL — ABNORMAL HIGH (ref 70–99)

## 2023-02-21 MED ORDER — AMIODARONE HCL 200 MG PO TABS
200.0000 mg | ORAL_TABLET | Freq: Once | ORAL | Status: AC
  Administered 2023-02-21: 200 mg via ORAL
  Filled 2023-02-21: qty 1

## 2023-02-21 MED ORDER — CARVEDILOL 3.125 MG PO TABS
3.1250 mg | ORAL_TABLET | Freq: Once | ORAL | Status: AC
  Administered 2023-02-21: 3.125 mg via ORAL
  Filled 2023-02-21: qty 1

## 2023-02-21 MED ORDER — AMIODARONE HCL 200 MG PO TABS
400.0000 mg | ORAL_TABLET | Freq: Two times a day (BID) | ORAL | Status: DC
  Administered 2023-02-21 – 2023-02-22 (×2): 400 mg via ORAL
  Filled 2023-02-21 (×2): qty 2

## 2023-02-21 MED ORDER — ROSUVASTATIN CALCIUM 5 MG PO TABS
5.0000 mg | ORAL_TABLET | Freq: Every evening | ORAL | Status: DC
  Administered 2023-02-21: 5 mg via ORAL
  Filled 2023-02-21: qty 1

## 2023-02-21 MED ORDER — CARVEDILOL 3.125 MG PO TABS
6.2500 mg | ORAL_TABLET | Freq: Two times a day (BID) | ORAL | Status: DC
  Administered 2023-02-21 – 2023-02-22 (×2): 6.25 mg via ORAL
  Filled 2023-02-21 (×2): qty 2

## 2023-02-21 NOTE — Progress Notes (Signed)
[facility] Liaison Note:  [facility] triage informed HL that the 02 has been delivered to the home.  Will continue to follow.    Please call with any Hospice related questions or concerns.  Thank you for the opportunity to participate in this patient's care  [facility] Liaison 336 [phone]

## 2023-02-21 NOTE — Care Management Important Message (Signed)
Important Message  Patient Details  Name: Aaron Gibbs MRN: [id number] Date of Birth: 02-02-1935   Medicare Important Message Given:  Other (see comment)  Disposition to discharge with hospice services.  Medicare IM withheld at this time out of respect for patient and family.   Johnell Comings 02/21/2023, 8:18 AM

## 2023-02-21 NOTE — Progress Notes (Signed)
                                                     Palliative Care Progress Note, Assessment & Plan   Patient Name: Aaron Gibbs       Date: 02/21/2023 DOB: 11-03-1934  Age: 88 y.o. MRN#: [id number] Attending Physician: Charise Killian, MD Primary Care Physician: Smitty Cords, DO Admit Date: 02/17/2023  Subjective: Patient is lying in bed on his right side.  He is acknowledges my presence and is able to make his wishes known.  He was requesting more pain medication.  No family/friends/caretaker present at bedside.   HPI: 88 y.o. male  with past medical history of blindness (retinitis pigmentosa), chronic pain (chronic opiates), COPD, diastolic CHF, depression/anxiety, history of traumatic C2 fracture (April 2024), and recent metastatic melanoma resection (02/15/2023) with PICC placement admitted on 02/17/2023 with weakness.  Patient is being treated for NSVT, C2 traumatic closed fracture, infrarenal abdominal aortic aneurysm, chronic HFpEF, and depression.   PMT was consulted to discuss goals of care.   Of note, patient and family are familiar to PMT as PMT was consulted during his May 2024 hospitalization [facility]).  Summary of counseling/coordination of care: After reviewing the patient's chart, I assessed patient's symptoms.  Patient endorsed pain in his neck.  I discussed pain regimen with patient.  Reviewed MAR and shared that next pain medication can be given within the next 30 minutes.  Patient seemed relieved at this information.  He endorses that when pain medicine is given regularly that his pain is minimized.  I attempted to discuss plan of care with patient.  He shared he does not know much about this and would just like to have some pain medicine.  No further questions or concerns at this time.  From chart review, plan is  for patient to discharge home with hospice services.  DNR with limited interventions remains.   PMT will continue to follow.  Physical Exam Vitals reviewed.  Constitutional:      General: He is not in acute distress.    Appearance: He is not toxic-appearing.  HENT:     Head: Normocephalic.     Mouth/Throat:     Mouth: Mucous membranes are moist.  Abdominal:     Palpations: Abdomen is soft.  Musculoskeletal:     Comments: Generalized weakness, MAETC  Skin:    General: Skin is warm and dry.     Coloration: Skin is pale.  Neurological:     Mental Status: He is alert and oriented to person, place, and time.             Total Time 25 minutes   Geneive Sandstrom L. Bonita Quin, DNP, FNP-BC Palliative Medicine Team

## 2023-02-21 NOTE — Plan of Care (Signed)
  Problem: Clinical Measurements: Goal: Cardiovascular complication will be avoided Outcome: Progressing   Problem: Nutrition: Goal: Adequate nutrition will be maintained Outcome: Progressing   Problem: Elimination: Goal: Will not experience complications related to bowel motility Outcome: Progressing Goal: Will not experience complications related to urinary retention Outcome: Progressing   Problem: Pain Managment: Goal: General experience of comfort will improve Outcome: Progressing

## 2023-02-21 NOTE — Progress Notes (Signed)
Cardiology Progress Note   Patient Name: Aaron Gibbs Date of Encounter: 02/21/2023  Primary Cardiologist: Julien Nordmann, MD  Subjective   Ongoing neck pain.  Sitting at side of bed.  Denies chest pain or dyspnea.  Runs of VT this morning though denies any presyncope, syncope, or palpitations.  Inpatient Medications    Scheduled Meds:  amiodarone  400 mg Oral BID   buprenorphine  1 patch Transdermal Weekly   carvedilol  6.25 mg Oral BID WC   Chlorhexidine Gluconate Cloth  6 each Topical Q0600   cyclobenzaprine  5 mg Oral TID   diclofenac Sodium  2 g Topical QHS   dorzolamide-timolol  1 drop Both Eyes BID   doxycycline  100 mg Oral BID   DULoxetine  90 mg Oral Daily   enoxaparin (LOVENOX) injection  40 mg Subcutaneous Q24H   ferrous sulfate  325 mg Oral Q supper   finasteride  5 mg Oral Daily   gabapentin  300 mg Oral BID   latanoprost  1 drop Both Eyes QHS   melatonin  5 mg Oral QHS   mupirocin ointment  1 Application Nasal BID   OLANZapine  2.5 mg Oral BID   pantoprazole  40 mg Oral QAC breakfast   polyethylene glycol  17 g Oral BID   sodium chloride flush  3 mL Intravenous Q12H   tamsulosin  0.4 mg Oral Daily   Continuous Infusions:  PRN Meds: acetaminophen **OR** acetaminophen, albuterol, ondansetron **OR** ondansetron (ZOFRAN) IV, oxyCODONE, traZODone   Vital Signs    Vitals:   02/21/23 0000 02/21/23 0108 02/21/23 0200 02/21/23 0500  BP: (!) 142/67 (!) 127/58 (!) 145/115   Pulse: 80 70 73   Resp: (!) 24 (!) 21 19   Temp: 99.1 F (37.3 C)     TempSrc: Axillary     SpO2: 94% 96% 94%   Weight:    104.2 kg  Height:        Intake/Output Summary (Last 24 hours) at 02/21/2023 1159 Last data filed at 02/21/2023 0900 Gross per 24 hour  Intake 120 ml  Output 400 ml  Net -280 ml   Filed Weights   02/19/23 0500 02/20/23 0500 02/21/23 0500  Weight: 99 kg 99.6 kg 104.2 kg    Physical Exam   GEN: Well nourished, well developed, in no acute distress.   HEENT: Grossly normal.  Neck: Supple, no JVD, carotid bruits, or masses. Cardiac: RRR, distant, no murmurs, rubs, or gallops. No clubbing, cyanosis.  Trace left arm/hand edema.  Radials 2+, DP/PT 2+ and equal bilaterally.  Respiratory:  Respirations regular and unlabored, diminished breath sounds with scattered rhonchi. GI: Soft, nontender, nondistended, BS + x 4. MS: no deformity or atrophy. Skin: warm and dry, no rash. Neuro:  Strength and sensation are intact. Psych: AAOx3.  Normal affect.  Labs    Chemistry Recent Labs  Lab 02/18/23 1002 02/19/23 1021 02/21/23 0533  NA 140 141 138  K 4.5 4.2 3.9  CL 107 108 105  CO2 25 24 25   GLUCOSE 123* 134* 114*  BUN 21 16 16   CREATININE 1.08 1.04 1.18  CALCIUM 8.3* 8.6* 8.6*  GFRNONAA >60 >60 >60  ANIONGAP 8 9 8      Hematology Recent Labs  Lab 02/15/23 0650 02/17/23 2033 02/18/23 1002  WBC 4.1 5.2 6.3  RBC 3.29* 2.97* 3.12*  HGB 9.8* 8.6* 9.1*  HCT 31.4* 29.0* 31.2*  MCV 95.4 97.6 100.0  MCH 29.8 29.0 29.2  MCHC 31.2  29.7* 29.2*  RDW 15.9* 16.0* 15.9*  PLT 153 137* 122*    Cardiac Enzymes  Recent Labs  Lab 02/17/23 1837 02/17/23 2033  TROPONINIHS 14 15      BNP    Component Value Date/Time   BNP 156.0 (H) 05/27/2022 0801   DDimer  Recent Labs  Lab 02/17/23 2034  DDIMER 1.28*     Lipids  Lab Results  Component Value Date   CHOL 118 02/21/2023   HDL 28 (L) 02/21/2023   LDLCALC 66 02/21/2023   TRIG 121 02/21/2023   CHOLHDL 4.2 02/21/2023    HbA1c  Lab Results  Component Value Date   HGBA1C 5.4 12/13/2019    Radiology    CT ANGIO HEAD NECK W WO CM  Result Date: 02/19/2023 CLINICAL DATA:  Headache, secondary (Ped 0-17y) EXAM: CT ANGIOGRAPHY HEAD AND NECK WITH AND WITHOUT CONTRAST TECHNIQUE: Multidetector CT imaging of the head and neck was performed using the standard protocol during bolus administration of intravenous contrast. Multiplanar CT image reconstructions and MIPs were obtained to  evaluate the vascular anatomy. Carotid stenosis measurements (when applicable) are obtained utilizing NASCET criteria, using the distal internal carotid diameter as the denominator. RADIATION DOSE REDUCTION: This exam was performed according to the departmental dose-optimization program which includes automated exposure control, adjustment of the mA and/or kV according to patient size and/or use of iterative reconstruction technique. CONTRAST:  75mL OMNIPAQUE IOHEXOL 350 MG/ML SOLN COMPARISON:  CT head June 11 24. CTA head/neck May 8 24. FINDINGS: Limited study due to patient positioning (patient unable to lay on their back because of neck pain). Within this limitation: CT HEAD FINDINGS Brain: No evidence of acute infarction, hemorrhage, hydrocephalus, extra-axial collection or mass lesion/mass effect. Patchy white matter hypodensities are nonspecific but compatible with chronic microvascular ischemic change. Vascular: See below. Skull: No acute fracture. Sinuses/Orbits: Moderate paranasal sinus mucosal thickening. No acute orbital findings. Other: No mastoid effusions. Review of the MIP images confirms the above findings CTA NECK FINDINGS Aortic arch: Great vessel origins are patent. Aortic atherosclerosis. Right carotid system: Atherosclerosis at the carotid bifurcation without greater than 50% stenosis. Left carotid system: Atherosclerosis at the carotid bifurcation without greater than 50% stenosis. Vertebral arteries: Chronically occluded distal right V2 vertebral artery with or distal faint reconstitution at the V3 segment. Left vertebral artery is patent in the neck. Skeleton: Chronic C2 dens fracture. Other neck: No acute abnormality on limited assessment. Upper chest: Partially imaged right pleural effusion with overlying opacities, better characterized on recent CT chest. Review of the MIP images confirms the above findings CTA HEAD FINDINGS Anterior circulation: Bilateral intracranial ICAs, MCAs, and ACAs  are patent proximally. Posterior circulation: Faint V3/V4 reconstitution with new/interval severe stenosis or occlusion of the intradural right vertebral artery. Left vertebral artery remains patent. The basilar artery and bilateral posterior cerebral arteries are patent without proximal high-grade stenosis. Venous sinuses: As permitted by contrast timing, patent. Review of the MIP images confirms the above findings IMPRESSION: 1. Chronic right V2 vertebral artery occlusion. There is faint V3/V4 reconstitution with new/interval severe stenosis or occlusion of the intradural right vertebral artery. Left vertebral artery remains patent. 2. Chronic C2 fracture. 3. Partially imaged right pleural effusion with overlying opacities, better characterized on recent CT chest. 4. Limited study due to patient positioning. Electronically Signed   By: Feliberto Harts M.D.   On: 02/19/2023 13:12   CT Angio Chest Pulmonary Embolism (PE) W or WO Contrast  Result Date: 02/18/2023 CLINICAL DATA:  Generalized  weakness. Unable to ambulate as well as normal. Melanoma excision with sentinel lymph node biopsy on the left on 02/15/2023. PE suspected. Abdominal aortic aneurysm follow-up. EXAM: CT ANGIOGRAPHY CHEST, ABDOMEN AND PELVIS TECHNIQUE: Non-contrast CT of the chest was initially obtained. Multidetector CT imaging through the chest, abdomen and pelvis was performed using the standard protocol during bolus administration of intravenous contrast. Multiplanar reconstructed images and MIPs were obtained and reviewed to evaluate the vascular anatomy. RADIATION DOSE REDUCTION: This exam was performed according to the departmental dose-optimization program which includes automated exposure control, adjustment of the mA and/or kV according to patient size and/or use of iterative reconstruction technique. CONTRAST:  OMNIPAQUE IOHEXOL 350 MG/ML SOLN COMPARISON:  Chest radiograph 02/17/2023; PET/CT 12/28/2022; CT abdomen and pelvis  10/17/2022; CT chest abdomen and pelvis 10/10/2022 FINDINGS: CTA CHEST FINDINGS Cardiovascular: Negative for acute pulmonary embolism. Normal caliber thoracic aorta without dissection. Coronary artery and aortic atherosclerotic calcification. No pericardial effusion. Right PICC tip in the low SVC. Mediastinum/Nodes: No thoracic adenopathy. Unremarkable trachea and esophagus. Lungs/Pleura: Bronchial wall thickening and scattered mucous plugging greatest in the right lower lobe. Bilateral lower lung bronchiolectasis. Scattered centrilobular and budding tree micro nodules greatest in the lingula. Decreased size of the thick-walled cavitary mass in the lingula compared with PET/CT 12/28/2022. This measures 3.2 x 1.7 cm today, previously 3.2 x 2.5 cm. Small right pleural effusion and associated atelectasis. No pneumothorax. Musculoskeletal: Subcutaneous emphysema in the anterolateral left chest wall. Loosely organized left axillary postprocedural hematoma/seroma measuring 4.9 x 2.2 x 4.6 cm related to recent axillary node biopsy. Thoracic kyphosis. No acute fracture. Fused anterior enthesophytes throughout the cervical and thoracic spine. Review of the MIP images confirms the above findings. CTA ABDOMEN AND PELVIS FINDINGS VASCULAR Aorta: Infrarenal abdominal aortic aneurysm again measuring up to 5.0 cm in greatest diameter. Aortic calcified atherosclerosis. No dissection. Celiac: No hemodynamically significant stenosis, aneurysm, or dissection. SMA: No hemodynamically significant stenosis, aneurysm, or dissection. Renals: No hemodynamically significant stenosis, aneurysm, or dissection. IMA: Patent. Inflow: Calcified atherosclerosis without significant stenosis. No aneurysm or dissection. Veins: No obvious venous abnormality within the limitations of this arterial phase study. Review of the MIP images confirms the above findings. NON-VASCULAR Hepatobiliary: Cholecystectomy. No focal hepatic lesion. Unchanged biliary  prominence. Pancreas: Fatty atrophy.  No acute abnormality. Spleen: Unremarkable. Adrenals/Urinary Tract: Stable adrenal glands. No urinary calculi or hydronephrosis. Unchanged 1.4 cm cystic lesion in the posterior right kidney. Unremarkable bladder. Stomach/Bowel: Normal caliber large and small bowel. Colonic diverticulosis without diverticulitis. Stomach is within normal limits. Normal appendix. Lymphatic: No lymphadenopathy. Reproductive: Unremarkable. Other: No free intraperitoneal fluid or air. Musculoskeletal: Demineralization. Bilateral femoral head AVN. Fused anterior syndesmophytes in the lumbar spine. Review of the MIP images confirms the above findings. IMPRESSION: 1. Negative for acute pulmonary embolism. 2. Infrarenal abdominal aortic aneurysm again measuring up to 5.0 cm in greatest diameter. Recommend follow-up CT/MR every 6 months and vascular consultation if not obtained. This recommendation follows ACR consensus guidelines: White Paper of the ACR Incidental Findings Committee II on Vascular Findings. J Am Coll Radiol 2013; 10:789-794. 3. Decreased size of the cavitary lesion in the lingula since PET/CT 12/28/2022. 4. Pulmonary findings compatible with infectious/inflammatory bronchiolitis. 5. Small right pleural effusion and associated atelectasis. 6. Loosely organized left axillary postprocedural hematoma/seroma measuring 4.9 x 2.2 x 4.6 cm related to recent axillary node biopsy. 7. 1.4 cm cystic lesion in the posterior right kidney. If clinically appropriate, renal mass protocol MRI could be obtained for further evaluation. Aortic Atherosclerosis (  ICD10-I70.0). Electronically Signed   By: Minerva Fester M.D.   On: 02/18/2023 01:03   CT Angio Abd/Pel w/ and/or w/o  Result Date: 02/18/2023 CLINICAL DATA:  Generalized weakness. Unable to ambulate as well as normal. Melanoma excision with sentinel lymph node biopsy on the left on 02/15/2023. PE suspected. Abdominal aortic aneurysm follow-up.  EXAM: CT ANGIOGRAPHY CHEST, ABDOMEN AND PELVIS TECHNIQUE: Non-contrast CT of the chest was initially obtained. Multidetector CT imaging through the chest, abdomen and pelvis was performed using the standard protocol during bolus administration of intravenous contrast. Multiplanar reconstructed images and MIPs were obtained and reviewed to evaluate the vascular anatomy. RADIATION DOSE REDUCTION: This exam was performed according to the departmental dose-optimization program which includes automated exposure control, adjustment of the mA and/or kV according to patient size and/or use of iterative reconstruction technique. CONTRAST:  OMNIPAQUE IOHEXOL 350 MG/ML SOLN COMPARISON:  Chest radiograph 02/17/2023; PET/CT 12/28/2022; CT abdomen and pelvis 10/17/2022; CT chest abdomen and pelvis 10/10/2022 FINDINGS: CTA CHEST FINDINGS Cardiovascular: Negative for acute pulmonary embolism. Normal caliber thoracic aorta without dissection. Coronary artery and aortic atherosclerotic calcification. No pericardial effusion. Right PICC tip in the low SVC. Mediastinum/Nodes: No thoracic adenopathy. Unremarkable trachea and esophagus. Lungs/Pleura: Bronchial wall thickening and scattered mucous plugging greatest in the right lower lobe. Bilateral lower lung bronchiolectasis. Scattered centrilobular and budding tree micro nodules greatest in the lingula. Decreased size of the thick-walled cavitary mass in the lingula compared with PET/CT 12/28/2022. This measures 3.2 x 1.7 cm today, previously 3.2 x 2.5 cm. Small right pleural effusion and associated atelectasis. No pneumothorax. Musculoskeletal: Subcutaneous emphysema in the anterolateral left chest wall. Loosely organized left axillary postprocedural hematoma/seroma measuring 4.9 x 2.2 x 4.6 cm related to recent axillary node biopsy. Thoracic kyphosis. No acute fracture. Fused anterior enthesophytes throughout the cervical and thoracic spine. Review of the MIP images confirms  the above findings. CTA ABDOMEN AND PELVIS FINDINGS VASCULAR Aorta: Infrarenal abdominal aortic aneurysm again measuring up to 5.0 cm in greatest diameter. Aortic calcified atherosclerosis. No dissection. Celiac: No hemodynamically significant stenosis, aneurysm, or dissection. SMA: No hemodynamically significant stenosis, aneurysm, or dissection. Renals: No hemodynamically significant stenosis, aneurysm, or dissection. IMA: Patent. Inflow: Calcified atherosclerosis without significant stenosis. No aneurysm or dissection. Veins: No obvious venous abnormality within the limitations of this arterial phase study. Review of the MIP images confirms the above findings. NON-VASCULAR Hepatobiliary: Cholecystectomy. No focal hepatic lesion. Unchanged biliary prominence. Pancreas: Fatty atrophy.  No acute abnormality. Spleen: Unremarkable. Adrenals/Urinary Tract: Stable adrenal glands. No urinary calculi or hydronephrosis. Unchanged 1.4 cm cystic lesion in the posterior right kidney. Unremarkable bladder. Stomach/Bowel: Normal caliber large and small bowel. Colonic diverticulosis without diverticulitis. Stomach is within normal limits. Normal appendix. Lymphatic: No lymphadenopathy. Reproductive: Unremarkable. Other: No free intraperitoneal fluid or air. Musculoskeletal: Demineralization. Bilateral femoral head AVN. Fused anterior syndesmophytes in the lumbar spine. Review of the MIP images confirms the above findings. IMPRESSION: 1. Negative for acute pulmonary embolism. 2. Infrarenal abdominal aortic aneurysm again measuring up to 5.0 cm in greatest diameter. Recommend follow-up CT/MR every 6 months and vascular consultation if not obtained. This recommendation follows ACR consensus guidelines: White Paper of the ACR Incidental Findings Committee II on Vascular Findings. J Am Coll Radiol 2013; 10:789-794. 3. Decreased size of the cavitary lesion in the lingula since PET/CT 12/28/2022. 4. Pulmonary findings compatible with  infectious/inflammatory bronchiolitis. 5. Small right pleural effusion and associated atelectasis. 6. Loosely organized left axillary postprocedural hematoma/seroma measuring 4.9 x 2.2 x 4.6  cm related to recent axillary node biopsy. 7. 1.4 cm cystic lesion in the posterior right kidney. If clinically appropriate, renal mass protocol MRI could be obtained for further evaluation. Aortic Atherosclerosis (ICD10-I70.0). Electronically Signed   By: Minerva Fester M.D.   On: 02/18/2023 01:03   DG Chest Port 1 View  Result Date: 02/17/2023 CLINICAL DATA:  Hypoxia. EXAM: PORTABLE CHEST 1 VIEW COMPARISON:  Chest radiograph dated 01/11/2023. FINDINGS: The heart size and mediastinal contours are within normal limits. There is mild right basilar atelectasis/airspace disease. A small right pleural effusion may contribute. Left interstitial and airspace opacities in the left mid lung appear similar to prior exam and may reflect an incompletely resolved cavitary lesion. No left pleural effusion. No pneumothorax on either side. A right upper extremity peripherally inserted central venous catheter tip overlies the superior cavoatrial junction. Degenerative changes are seen in the spine. IMPRESSION: 1. Mild right basilar atelectasis/airspace disease. A small right pleural effusion may contribute. 2. Left interstitial and airspace opacities in the left mid lung appear similar to prior exam and may reflect an incompletely resolved cavitary lesion. Electronically Signed   By: Romona Curls M.D.   On: 02/17/2023 20:08    Telemetry    RSR w/ prolonged runs of VT between 100-105 bpm between 5:30 AM and 6:05 AM this morning - Personally Reviewed  Cardiac Studies   2D Echocardiogram 9.1.2024   1. Left ventricular ejection fraction, by estimation, is 55 to 60%. The  left ventricle has normal function. The left ventricle has no regional  wall motion abnormalities. Left ventricular diastolic parameters are  indeterminate.    2. Right ventricular systolic function is normal. The right ventricular  size is normal. Tricuspid regurgitation signal is inadequate for assessing  PA pressure.   3. Left atrial size was moderately dilated.   4. The mitral valve is grossly normal. Trivial mitral valve  regurgitation. No evidence of mitral stenosis.   5. The aortic valve was not well visualized. There is mild calcification  of the aortic valve. There is mild thickening of the aortic valve. Aortic  valve regurgitation is mild to moderate. Aortic valve  sclerosis/calcification is present, without any  evidence of aortic stenosis.   6. Aortic dilatation noted. There is borderline dilatation of the aortic  root, measuring 40 mm.  _____________   Patient Profile     88 y.o. male w/ a h/o blindness, chronic pain (chronic opioids), COPD, HFpEF, depression/anxiety, HTN, AAA (5cm), traumatic C2 fracture (09/2022) w/ ongoing neck pain, metastatic melanoma (forearms), and obesity, who was admitted 8/31 with weakness and malaise and was noted to have runs of VT assoc w/ hypotension.   Assessment & Plan    1.  Ventricular tachycardia:  Presented 8/31 w/ weakness/malaise, and was noted to have runs of VT in the ED  Better w/ IV amio.  Normal hsTrops 14  15.  Echo w/ nl LV fxn.  Transition to p.o. amiodarone on September 3 and this morning, had 2 prolonged runs of sustained and asymptomatic ventricular tachycardia, the longest lasting approximately 9 minutes.  Patient denies chest pain, dyspnea, presyncope, or syncope.  We have increased amiodarone to 400 mg twice daily and increased carvedilol to 6.25 mg twice daily.  Potassium and magnesium are normal.  We discussed the role of ischemic evaluation again today.  Due to ongoing neck pain and kyphosis, it is unlikely that he will be able to lie flat and or still for cath or stress testing.  Though  he may tolerate dobutamine echocardiogram, this would likely increase his risk of ventricular  arrhythmias and thus is not an ideal study.  We agreed to continue a conservative approach.  LDL 66 without statin.  2.  Essential hypertension: Higher this morning.  Titrating beta-blocker.  3.  Infrarenal abdominal aortic aneurysm: 5 cm on CT.  If interested, will need outpatient vascular surgery follow-up.  Titrating beta-blocker.  4.  Cervical fracture/neck pain: Chronic C2 fracture on CT.  Seen by neurosurgery.  Patient unsure if he would want to proceed with surgery on his cervical spine and is currently being treated conservatively.  5.  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  Conservative management.  6.  Left axillary hematoma/seroma: Status post lymph node biopsy with associated swelling.  H&H stable earlier this week.  Signed, Nicolasa Ducking, NP  02/21/2023, 11:59 AM    For questions or updates, please contact   Please consult [email or web address] for contact info under Cardiology/STEMI.

## 2023-02-21 NOTE — Progress Notes (Signed)
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 Will d/c home w/ hospice whenever equipment is set up at pt's home   NSVT: w/ repeated episodes of nonsustained V. tach in the ED lasting up to 30 seconds and symptomatic with an episode of hypotension that responded to IV fluid resuscitation. Continue on amio, coreg.No plans for further ischemic testing as pt is unable to tolerate a cardiac stress test.   Hypotension: resolved.   Generalized weakness: PT/OT recs   Malignant melanoma: of skin left forearm s/p resection 8/29/202. Continue on doxycycline. Management per onco   Anemia of chronic disease: H&H are labile. No need for a transfusion currently   Traumatic closed fracture of C2 vertebra: with minimal displacement, sequela. Continue on c-collar. Hydrocodone prn  Infrarenal abdominal aortic aneurysm: arranging preoperative clearance for endovascular repair as per vasc surg. Last aneurysm size was 5 cm   Chronic diastolic CHF: continue on olmesartan, coreg. Appears euvolemic. Holding bumex   Depression: severity unknown. Continue on home dose of duloxetine, trazodone    COPD:  w/o exacerbation. Bronchodilators prn    Long term use of opiates: continue on oxycodone, butrans patch    Blindness: secondary to retinitis pigmentosa of both eyes. Continue w/ supportive care    Severe stenosis/occlusion right vertebral artery: no indication to start aspirin as per neuro. Neuro recs recs apprec  Thrombocytopenia: etiology unclear. Will continue to monitor   Obesity: BMI 32.0. Would benefit from weight loss   DVT prophylaxis: lovenox  Code Status: DNR Family Communication: Disposition Plan: likely d/c home w/ hospice   Level of care: Med-Surg Status is: Inpatient Remains inpatient appropriate because: waiting on equipment to be set up at home for hospice care     Consultants:  Palliative care  hospice  Procedures:  Antimicrobials:   Subjective: Pt c/o neck pain   Objective: Vitals:   02/21/23 0000 02/21/23 0108 02/21/23 0200 02/21/23 0500  BP: (!) 142/67 (!) 127/58 (!) 145/115   Pulse: 80 70 73   Resp: (!) 24 (!) 21 19   Temp: 99.1 F (37.3 C)     TempSrc: Axillary     SpO2: 94% 96% 94%   Weight:    104.2 kg  Height:        Intake/Output Summary (Last 24 hours) at 02/21/2023 0713 Last data filed at 02/20/2023 2000 Gross per 24 hour  Intake 69.23 ml  Output 600 ml  Net -530.77 ml   Filed Weights   02/19/23 0500 02/20/23 0500 02/21/23 0500  Weight: 99 kg 99.6 kg 104.2 kg    Examination:  General exam: Appears calm but uncomfortable  Respiratory system: diminished breath sounds b/l  Cardiovascular system: S1 &  S2 +. No rubs, gallops or clicks. Gastrointestinal system: Abdomen is nondistended, soft and nontender.  Normal bowel sounds heard. Central nervous system: Alert and awake  Psychiatry: Judgement and insight appears poor. Flat mood and affect     Data Reviewed: I have personally reviewed following labs and imaging studies  CBC: Recent Labs  Lab 02/15/23 0650 02/17/23 2033 02/18/23 1002  WBC 4.1 5.2 6.3  HGB 9.8* 8.6*  9.1*  HCT 31.4* 29.0* 31.2*  MCV 95.4 97.6 100.0  PLT 153 137* 122*   Basic Metabolic Panel: Recent Labs  Lab 02/15/23 0650 02/17/23 2033 02/18/23 1002 02/19/23 1021  NA 138 139 140 141  K 4.1 4.2 4.5 4.2  CL 105 104 107 108  CO2 27 26 25 24   GLUCOSE 121* 124* 123* 134*  BUN 20 22 21 16   CREATININE 1.07 1.18 1.08 1.04  CALCIUM 8.7* 8.5* 8.3* 8.6*  MG  --  2.0 2.0 1.9   GFR: Estimated Creatinine Clearance: 63.8 mL/min (by C-G formula based on SCr of 1.04 mg/dL). Liver Function Tests: No results for input(s): "AST", "ALT", "ALKPHOS", "BILITOT", "PROT", "ALBUMIN" in the last 168 hours. No results for input(s): "LIPASE", "AMYLASE" in the last 168 hours. No results for input(s): "AMMONIA" in the last 168 hours. Coagulation Profile: No results for input(s): "INR", "PROTIME" in the last 168 hours. Cardiac Enzymes: No results for input(s): "CKTOTAL", "CKMB", "CKMBINDEX", "TROPONINI" in the last 168 hours. BNP (last 3 results) No results for input(s): "PROBNP" in the last 8760 hours. HbA1C: No results for input(s): "HGBA1C" in the last 72 hours. CBG: Recent Labs  Lab 02/19/23 0717 02/20/23 0805 02/21/23 0505  GLUCAP 113* 123* 106*   Lipid Profile: Recent Labs    02/21/23 0533  CHOL 118  HDL 28*  LDLCALC 66  TRIG 409  CHOLHDL 4.2   Thyroid Function Tests: Recent Labs    02/18/23 1002  TSH 1.297   Anemia Panel: No results for input(s): "VITAMINB12", "FOLATE", "FERRITIN", "TIBC", "IRON", "RETICCTPCT" in the last 72 hours. Sepsis Labs: Recent Labs  Lab 02/17/23 1815 02/17/23 1837 02/17/23 2033  PROCALCITON <0.10  --   --   LATICACIDVEN  --  1.2 0.6    Recent Results (from the past 240 hour(s))  SARS Coronavirus 2 by RT PCR (hospital order, performed in [facility] hospital lab) *cepheid single result test* Anterior Nasal Swab     Status: None   Collection Time: 02/17/23  6:30 PM   Specimen: Anterior Nasal Swab  Result Value Ref Range Status   SARS  Coronavirus 2 by RT PCR NEGATIVE NEGATIVE Final    Comment: (NOTE) SARS-CoV-2 target nucleic acids are NOT DETECTED.  The SARS-CoV-2 RNA is generally detectable in upper and lower respiratory specimens during the acute phase of infection. The lowest concentration of SARS-CoV-2 viral copies this assay can detect is 250 copies / mL. A negative result does not preclude SARS-CoV-2 infection and should not be used as the sole basis for treatment or other patient management decisions.  A negative result may occur with improper specimen collection / handling, submission of specimen other than nasopharyngeal swab, presence of viral mutation(s) within the areas targeted by this assay, and inadequate number of viral copies (<250 copies / mL). A negative result must be combined with clinical observations, patient history, and epidemiological information.  Fact Sheet for Patients:   RoadLapTop.co.za  Fact Sheet for Healthcare Providers: http://kim-miller.com/  This test is not yet approved or  cleared by the [place]  FDA and has been authorized for detection and/or diagnosis of SARS-CoV-2 by FDA under an Emergency Use Authorization (EUA).  This EUA will remain in effect (meaning this test can be used) for the duration of the COVID-19 declaration under Section 564(b)(1) of the Act, 21 U.S.C. section 360bbb-3(b)(1), unless the authorization is terminated or revoked sooner.  Performed at [facility], [address] [place], [place] [address]   Culture, blood (Routine X 2) w Reflex to ID Panel     Status: None (Preliminary result)   Collection Time: 02/17/23  8:34 PM   Specimen: BLOOD RIGHT ARM  Result Value Ref Range Status   Specimen Description BLOOD RIGHT ARM  Final   Special Requests   Final    BOTTLES DRAWN AEROBIC AND ANAEROBIC Blood Culture adequate volume   Culture   Final    NO GROWTH 2 DAYS Performed at [facility], [address], [place], [place] [address]    Report Status PENDING  Incomplete  MRSA Next Gen by PCR, Nasal     Status: Abnormal   Collection Time: 02/18/23 10:00 AM   Specimen: Nasal Mucosa; Nasal Swab  Result Value Ref Range Status   MRSA by PCR Next Gen DETECTED (A) NOT DETECTED Final    Comment: RESULT CALLED TO, READ BACK BY AND VERIFIED WITH:  ANGELA MAYFIELD 1435 02/18/2023 CP (NOTE) The GeneXpert MRSA Assay (FDA approved for NASAL specimens only), is one component of a comprehensive MRSA colonization surveillance program. It is not intended to diagnose MRSA infection nor to guide or monitor treatment for MRSA infections. Test performance is not FDA approved in patients less than 107 years old. Performed at [facility], [address] [place], [place] [address]   Culture, blood (Routine X 2) w Reflex to ID Panel     Status: None (Preliminary result)   Collection Time: 02/18/23 10:02 AM   Specimen: BLOOD  Result Value Ref Range Status   Specimen Description BLOOD BLOOD RIGHT HAND  Final   Special Requests   Final    BOTTLES DRAWN AEROBIC AND ANAEROBIC Blood Culture adequate volume   Culture   Final    NO GROWTH 2 DAYS Performed at [facility], [address], [place], [place] [address]    Report Status PENDING  Incomplete         Radiology Studies: CT ANGIO HEAD NECK W WO CM  Result Date: 02/19/2023 CLINICAL DATA:  Headache, secondary (Ped 0-17y) EXAM: CT ANGIOGRAPHY HEAD AND NECK WITH AND WITHOUT CONTRAST TECHNIQUE: Multidetector CT imaging of the head and neck was performed using the standard protocol during bolus administration of intravenous contrast. Multiplanar CT image reconstructions and MIPs were obtained to evaluate the vascular anatomy. Carotid stenosis measurements (when applicable) are obtained utilizing NASCET criteria, using the distal internal carotid diameter as the denominator. RADIATION DOSE REDUCTION: This exam was  performed according to the departmental dose-optimization program which includes automated exposure control, adjustment of the mA and/or kV according to patient size and/or use of iterative reconstruction technique. CONTRAST:  75mL OMNIPAQUE IOHEXOL 350 MG/ML SOLN COMPARISON:  CT head June 11 24. CTA head/neck May 8 24. FINDINGS: Limited study due to patient positioning (patient unable to lay on their back because of neck pain). Within this limitation: CT HEAD FINDINGS Brain: No evidence of acute infarction, hemorrhage, hydrocephalus, extra-axial collection or mass lesion/mass effect. Patchy white matter hypodensities are nonspecific but compatible with chronic microvascular ischemic change. Vascular: See below. Skull: No  acute fracture. Sinuses/Orbits: Moderate paranasal sinus mucosal thickening. No acute orbital findings. Other: No mastoid effusions. Review of the MIP images confirms the above findings CTA NECK FINDINGS Aortic arch: Great vessel origins are patent. Aortic atherosclerosis. Right carotid system: Atherosclerosis at the carotid bifurcation without greater than 50% stenosis. Left carotid system: Atherosclerosis at the carotid bifurcation without greater than 50% stenosis. Vertebral arteries: Chronically occluded distal right V2 vertebral artery with or distal faint reconstitution at the V3 segment. Left vertebral artery is patent in the neck. Skeleton: Chronic C2 dens fracture. Other neck: No acute abnormality on limited assessment. Upper chest: Partially imaged right pleural effusion with overlying opacities, better characterized on recent CT chest. Review of the MIP images confirms the above findings CTA HEAD FINDINGS Anterior circulation: Bilateral intracranial ICAs, MCAs, and ACAs are patent proximally. Posterior circulation: Faint V3/V4 reconstitution with new/interval severe stenosis or occlusion of the intradural right vertebral artery. Left vertebral artery remains patent. The basilar artery  and bilateral posterior cerebral arteries are patent without proximal high-grade stenosis. Venous sinuses: As permitted by contrast timing, patent. Review of the MIP images confirms the above findings IMPRESSION: 1. Chronic right V2 vertebral artery occlusion. There is faint V3/V4 reconstitution with new/interval severe stenosis or occlusion of the intradural right vertebral artery. Left vertebral artery remains patent. 2. Chronic C2 fracture. 3. Partially imaged right pleural effusion with overlying opacities, better characterized on recent CT chest. 4. Limited study due to patient positioning. Electronically Signed   By: Feliberto Harts M.D.   On: 02/19/2023 13:12        Scheduled Meds:  amiodarone  200 mg Oral BID   buprenorphine  1 patch Transdermal Weekly   carvedilol  3.125 mg Oral BID WC   Chlorhexidine Gluconate Cloth  6 each Topical Q0600   cyclobenzaprine  5 mg Oral TID   diclofenac Sodium  2 g Topical QHS   dorzolamide-timolol  1 drop Both Eyes BID   doxycycline  100 mg Oral BID   DULoxetine  90 mg Oral Daily   enoxaparin (LOVENOX) injection  40 mg Subcutaneous Q24H   ferrous sulfate  325 mg Oral Q supper   finasteride  5 mg Oral Daily   gabapentin  300 mg Oral BID   latanoprost  1 drop Both Eyes QHS   melatonin  5 mg Oral QHS   mupirocin ointment  1 Application Nasal BID   OLANZapine  2.5 mg Oral BID   pantoprazole  40 mg Oral QAC breakfast   polyethylene glycol  17 g Oral BID   sodium chloride flush  3 mL Intravenous Q12H   tamsulosin  0.4 mg Oral Daily   Continuous Infusions:   LOS: 4 days    Time spent: 25 mins     Charise Killian, MD [facility] Pager [phone]  If 7PM-7AM, please contact night-coverage [email or web address] 02/21/2023, 7:13 AM

## 2023-02-22 DIAGNOSIS — I472 Ventricular tachycardia, unspecified: Secondary | ICD-10-CM | POA: Diagnosis not present

## 2023-02-22 DIAGNOSIS — R531 Weakness: Secondary | ICD-10-CM | POA: Diagnosis not present

## 2023-02-22 DIAGNOSIS — S12100S Unspecified displaced fracture of second cervical vertebra, sequela: Secondary | ICD-10-CM | POA: Diagnosis not present

## 2023-02-22 DIAGNOSIS — F418 Other specified anxiety disorders: Secondary | ICD-10-CM | POA: Diagnosis not present

## 2023-02-22 DIAGNOSIS — Z79891 Long term (current) use of opiate analgesic: Secondary | ICD-10-CM

## 2023-02-22 LAB — BASIC METABOLIC PANEL
Anion gap: 7 (ref 5–15)
BUN: 21 mg/dL (ref 8–23)
CO2: 27 mmol/L (ref 22–32)
Calcium: 8.4 mg/dL — ABNORMAL LOW (ref 8.9–10.3)
Chloride: 104 mmol/L (ref 98–111)
Creatinine, Ser: 0.98 mg/dL (ref 0.61–1.24)
GFR, Estimated: >60 mL/min (ref >60)
Glucose, Bld: 112 mg/dL — ABNORMAL HIGH (ref 70–99)
Potassium: 3.9 mmol/L (ref 3.5–5.1)
Sodium: 138 mmol/L (ref 135–145)

## 2023-02-22 LAB — MAGNESIUM: Magnesium: 2 mg/dL (ref 1.7–2.4)

## 2023-02-22 LAB — GLUCOSE, CAPILLARY: Glucose-Capillary: 82 mg/dL (ref 70–99)

## 2023-02-22 MED ORDER — CARVEDILOL 12.5 MG PO TABS: 12.5000 mg | ORAL_TABLET | Freq: Two times a day (BID) | ORAL | Status: DC

## 2023-02-22 MED ORDER — ROSUVASTATIN CALCIUM 5 MG PO TABS: 5.0000 mg | ORAL_TABLET | Freq: Every evening | ORAL | 0 refills | Status: DC

## 2023-02-22 MED ORDER — AMIODARONE HCL 200 MG PO TABS: 200.0000 mg | ORAL_TABLET | Freq: Two times a day (BID) | ORAL | 0 refills | Status: DC

## 2023-02-22 MED ORDER — AMIODARONE HCL 400 MG PO TABS: 400.0000 mg | ORAL_TABLET | Freq: Two times a day (BID) | ORAL | 0 refills | Status: DC

## 2023-02-22 NOTE — Progress Notes (Signed)
Andrill RN aware of order to remove PICC line.

## 2023-02-22 NOTE — Discharge Summary (Signed)
Physician Discharge Summary  Aaron Gibbs WUX:[id number] DOB: 24-Dec-1934 DOA: 02/17/2023  PCP: Smitty Cords, DO  Admit date: 02/17/2023 Discharge date: 02/22/2023  Admitted From: home Disposition:  home w/ hospice   Recommendations for Outpatient Follow-up:  Follow up with hospice provider ASAP   Home Health: no  Equipment/Devices:  Discharge Condition: stable  CODE STATUS: DNR Diet recommendation: as tolerated  Brief/Interim Summary: HPI was taken from Dr. Para March:  Aaron Gibbs is a 88 y.o. 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  At baseline patient is able to ambulate but needed assistance.  He has had no nausea, vomiting, abdominal pain, diarrhea or dysuria or cough, fever or chills or shortness of breath.  Denies chest pain, lower extremity pain or swelling. ED course and data review: Briefly hypoxic to 89 with otherwise normal vitals.  CBC and BMP notable for hemoglobin of 8.6 down from 9.82 days ago on the day of resection and 11.7 a month ago.  WBC normal and lactic acid 0.6. Potassium and magnesium normal Lactic acid 0.6 Troponin 15 EKG #1 ectopic atrial rhythm at 67 Chest x-ray showed small right pleural effusion but otherwise unchanged findings from 01/11/2023   While in the emergency room patient went into asymptomatic V. tach with episodes lasting 15 to 30 seconds with reading EKG #2 showed sinus tachycardia at 125 with atrial premature complex and LBBB   Patient was loaded with amiodarone bolus  followed by infusion  He had a hypotensive response to Amio with systolic going as low as the 70s but was fluid responsive to 121/51 at the time of admission  Patient subsequently remained stable Hospitalist consulted for admission.   As per Dr. Joylene Igo: 09/01 -patient is seen and examined at the bedside. Complains of pain in his neck and in his left forearm. Remains on amiodarone drip      09/02 -CT angiogram of the head and neck done for evaluation of worsening back pain     09/03 -transition to oral amiodarone.  Will transfer out of stepdown unit    As per Dr. Mayford Knife 9/4-02/22/23: Equipment was delivered to pt's house on 02/21/23. Pt was d/c home w/ hospice.    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 D/c home w/ hospice   NSVT: w/ repeated episodes of nonsustained V. tach in the ED lasting up to 30 seconds and symptomatic with an episode of hypotension that  responded to IV fluid resuscitation. Continue on amio, coreg.No plans for further ischemic testing as pt is unable to tolerate a cardiac stress test.   Hypotension: resolved.   Generalized weakness: d/c home w/ hospice    Malignant melanoma: of skin left forearm s/p resection 8/29/202. Continue on doxycycline. Management per onco   Anemia of chronic  disease: H&H are labile. No need for a transfusion currently   Traumatic closed fracture of C2 vertebra: with minimal displacement, sequela. Continue on c-collar. Hydrocodone prn  Infrarenal abdominal aortic aneurysm: arranging preoperative clearance for endovascular repair as per vasc surg. Last aneurysm size was 5 cm   Chronic diastolic CHF: continue on coreg, statin. Appears euvolemic. Holding bumex   Depression: severity unknown. Continue on home dose of duloxetine, trazodone    COPD: w/o exacerbation. Bronchodilators prn    Long term use of opiates: continue on oxycodone, butrans patch    Blindness: secondary to retinitis pigmentosa of both eyes. Continue w/ supportive care    Severe stenosis/occlusion right vertebral artery: no indication to start aspirin as per neuro. Neuro recs recs apprec  Thrombocytopenia: etiology unclear. Will continue to monitor   Obesity: BMI 32.0. Would benefit from weight loss  Discharge Instructions  Discharge Instructions     Diet general   Complete by: As directed    As tolerated   Discharge instructions   Complete by: As directed    F/u w/ hospice provider as soon as possible   Discharge wound care:   Complete by: As directed    Wound care  Every other day    Comments: Apply 1/4" thick layer of surgi lube to RFA surgical wound, top with Vaseline gauze, kerlix. Change every other day   Increase activity slowly   Complete by: As directed       Allergies as of 02/22/2023       Reactions   Anthralin Shortness Of Breath   Azithromycin Shortness Of Breath        Medication List     STOP taking these medications    amLODipine 10 MG tablet Commonly known as: NORVASC   bumetanide 0.5 MG tablet Commonly known as: BUMEX       TAKE these medications    acetaminophen 500 MG tablet Commonly known as: TYLENOL Take 1,000 mg by mouth in the morning, at noon, and at bedtime.   albuterol 108 (90 Base) MCG/ACT inhaler Commonly  known as: VENTOLIN HFA Inhale 2 puffs into the lungs every 4 (four) hours as needed for wheezing or shortness of breath.   amiodarone 400 MG tablet Commonly known as: PACERONE Take 1 tablet (400 mg total) by mouth 2 (two) times daily for 14 days.   amiodarone 200 MG tablet Commonly known as: Pacerone Take 1 tablet (200 mg total) by mouth 2 (two) times daily. Only start this script after completing amiodarone 400 mg BID x 14 days.   buprenorphine 10 MCG/HR Ptwk Commonly known as: BUTRANS Place 1 patch onto the skin once a week for 28 days. Apply only 1 patch at a time and alternate sites weekly. What changed: Another medication with the same name was removed. Continue taking this medication, and follow the directions you see here.   carvedilol 12.5 MG tablet Commonly known as: COREG Take 1 tablet (12.5 mg total) by mouth 2 (two) times daily.   Cosopt 2-0.5 % ophthalmic solution Generic drug: dorzolamide-timolol Place 1 drop into both eyes 2 (two) times daily.   diclofenac Sodium 1 %  Gel Commonly known as: VOLTAREN Apply 2 g topically at bedtime. Apply to both knees twice daily What changed: additional instructions   doxycycline 100 MG tablet Commonly known as: VIBRA-TABS Take 1 tablet (100 mg total) by mouth 2 (two) times daily.   DULoxetine 30 MG capsule Commonly known as: CYMBALTA Take 3 capsules (90 mg total) by mouth daily.   finasteride 5 MG tablet Commonly known as: Proscar Take 1 tablet (5 mg total) by mouth daily.   gabapentin 300 MG capsule Commonly known as: NEURONTIN Take 1 capsule (300 mg total) by mouth 2 (two) times daily.   Iron (Ferrous Sulfate) 325 (65 Fe) MG Tabs Take 325 mg by mouth daily with supper.   latanoprost 0.005 % ophthalmic solution Commonly known as: XALATAN Place 1 drop into both eyes at bedtime.   Melatonin 5 MG Caps Take 1 capsule (5 mg total) by mouth at bedtime. What changed: how much to take   meloxicam 15 MG tablet Commonly  known as: MOBIC TAKE 1 TABLET(15 MG) BY MOUTH DAILY What changed: See the new instructions.   OLANZapine 2.5 MG tablet Commonly known as: ZYPREXA Take 1 tablet (2.5 mg total) by mouth 2 (two) times daily.   olmesartan 40 MG tablet Commonly known as: BENICAR Take 1 tablet (40 mg total) by mouth daily.   oxyCODONE 5 MG immediate release tablet Commonly known as: Oxy IR/ROXICODONE Take 1 tablet (5 mg total) by mouth every 6 (six) hours as needed for severe pain.   pantoprazole 40 MG tablet Commonly known as: PROTONIX Take 1 tablet (40 mg total) by mouth daily before breakfast.   polyethylene glycol 17 g packet Commonly known as: MIRALAX / GLYCOLAX Take 17 g by mouth 2 (two) times daily.   rosuvastatin 5 MG tablet Commonly known as: CRESTOR Take 1 tablet (5 mg total) by mouth every evening.   tamsulosin 0.4 MG Caps capsule Commonly known as: FLOMAX Take 1 capsule (0.4 mg total) by mouth daily.   traZODone 150 MG tablet Commonly known as: DESYREL Take 1 tablet (150 mg total) by mouth at bedtime as needed.               Discharge Care Instructions  (From admission, onward)           Start     Ordered   02/22/23 0000  Discharge wound care:       Comments: Wound care  Every other day    Comments: Apply 1/4" thick layer of surgi lube to RFA surgical wound, top with Vaseline gauze, kerlix. Change every other day   02/22/23 1234            Allergies  Allergen Reactions   Anthralin Shortness Of Breath   Azithromycin Shortness Of Breath    Consultations: Cardio Palliative care  Hospice    Procedures/Studies: CT ANGIO HEAD NECK W WO CM  Result Date: 02/19/2023 CLINICAL DATA:  Headache, secondary (Ped 0-17y) EXAM: CT ANGIOGRAPHY HEAD AND NECK WITH AND WITHOUT CONTRAST TECHNIQUE: Multidetector CT imaging of the head and neck was performed using the standard protocol during bolus administration of intravenous contrast. Multiplanar CT image reconstructions  and MIPs were obtained to evaluate the vascular anatomy. Carotid stenosis measurements (when applicable) are obtained utilizing NASCET criteria, using the distal internal carotid diameter as the denominator. RADIATION DOSE REDUCTION: This exam was performed according to the departmental dose-optimization program which includes automated exposure control, adjustment of the mA and/or kV according to patient size and/or use  of iterative reconstruction technique. CONTRAST:  75mL OMNIPAQUE IOHEXOL 350 MG/ML SOLN COMPARISON:  CT head June 11 24. CTA head/neck May 8 24. FINDINGS: Limited study due to patient positioning (patient unable to lay on their back because of neck pain). Within this limitation: CT HEAD FINDINGS Brain: No evidence of acute infarction, hemorrhage, hydrocephalus, extra-axial collection or mass lesion/mass effect. Patchy white matter hypodensities are nonspecific but compatible with chronic microvascular ischemic change. Vascular: See below. Skull: No acute fracture. Sinuses/Orbits: Moderate paranasal sinus mucosal thickening. No acute orbital findings. Other: No mastoid effusions. Review of the MIP images confirms the above findings CTA NECK FINDINGS Aortic arch: Great vessel origins are patent. Aortic atherosclerosis. Right carotid system: Atherosclerosis at the carotid bifurcation without greater than 50% stenosis. Left carotid system: Atherosclerosis at the carotid bifurcation without greater than 50% stenosis. Vertebral arteries: Chronically occluded distal right V2 vertebral artery with or distal faint reconstitution at the V3 segment. Left vertebral artery is patent in the neck. Skeleton: Chronic C2 dens fracture. Other neck: No acute abnormality on limited assessment. Upper chest: Partially imaged right pleural effusion with overlying opacities, better characterized on recent CT chest. Review of the MIP images confirms the above findings CTA HEAD FINDINGS Anterior circulation: Bilateral  intracranial ICAs, MCAs, and ACAs are patent proximally. Posterior circulation: Faint V3/V4 reconstitution with new/interval severe stenosis or occlusion of the intradural right vertebral artery. Left vertebral artery remains patent. The basilar artery and bilateral posterior cerebral arteries are patent without proximal high-grade stenosis. Venous sinuses: As permitted by contrast timing, patent. Review of the MIP images confirms the above findings IMPRESSION: 1. Chronic right V2 vertebral artery occlusion. There is faint V3/V4 reconstitution with new/interval severe stenosis or occlusion of the intradural right vertebral artery. Left vertebral artery remains patent. 2. Chronic C2 fracture. 3. Partially imaged right pleural effusion with overlying opacities, better characterized on recent CT chest. 4. Limited study due to patient positioning. Electronically Signed   By: Feliberto Harts M.D.   On: 02/19/2023 13:12   ECHOCARDIOGRAM COMPLETE  Result Date: 02/19/2023    ECHOCARDIOGRAM REPORT   Patient Name:   Aaron Gibbs Date of Exam: 02/18/2023 Medical Rec #:  [id number]        Height:       71.0 in Accession #:    [id number]       Weight:       213.8 lb Date of Birth:  26-Feb-1935        BSA:          2.169 m Patient Age:    87 years         BP:           141/87 mmHg Patient Gender: M                HR:           68 bpm. Exam Location:  [facility] Procedure: 2D Echo, Cardiac Doppler and Color Doppler Indications:     Syncope R55  History:         Patient has no prior history of Echocardiogram examinations.                  CHF; Risk Factors:Hypertension.  Sonographer:     Neysa Bonito Roar Referring Phys:  [id number] Andris Baumann Diagnosing Phys: Yvonne Kendall MD IMPRESSIONS  1. Left ventricular ejection fraction, by estimation, is 55 to 60%. The left ventricle has normal function. The left ventricle has no regional wall motion abnormalities.  Left ventricular diastolic parameters are indeterminate.  2. Right ventricular  systolic function is normal. The right ventricular size is normal. Tricuspid regurgitation signal is inadequate for assessing PA pressure.  3. Left atrial size was moderately dilated.  4. The mitral valve is grossly normal. Trivial mitral valve regurgitation. No evidence of mitral stenosis.  5. The aortic valve was not well visualized. There is mild calcification of the aortic valve. There is mild thickening of the aortic valve. Aortic valve regurgitation is mild to moderate. Aortic valve sclerosis/calcification is present, without any evidence of aortic stenosis.  6. Aortic dilatation noted. There is borderline dilatation of the aortic root, measuring 40 mm. FINDINGS  Left Ventricle: Left ventricular ejection fraction, by estimation, is 55 to 60%. The left ventricle has normal function. The left ventricle has no regional wall motion abnormalities. The left ventricular internal cavity size was normal in size. There is  borderline left ventricular hypertrophy. Left ventricular diastolic parameters are indeterminate. Right Ventricle: The right ventricular size is normal. Right vetricular wall thickness was not well visualized. Right ventricular systolic function is normal. Tricuspid regurgitation signal is inadequate for assessing PA pressure. Left Atrium: Left atrial size was moderately dilated. Right Atrium: Right atrial size was normal in size. Pericardium: The pericardium was not well visualized. Mitral Valve: The mitral valve is grossly normal. Trivial mitral valve regurgitation. No evidence of mitral valve stenosis. MV peak gradient, 2.9 mmHg. The mean mitral valve gradient is 2.0 mmHg. Tricuspid Valve: The tricuspid valve is not well visualized. Tricuspid valve regurgitation is trivial. Aortic Valve: The aortic valve was not well visualized. There is mild calcification of the aortic valve. There is mild thickening of the aortic valve. Aortic valve regurgitation is mild to moderate. Aortic regurgitation PHT  measures 488 msec. Aortic valve sclerosis/calcification is present, without any evidence of aortic stenosis. Aortic valve mean gradient measures 6.0 mmHg. Aortic valve peak gradient measures 13.7 mmHg. Aortic valve area, by VTI measures 2.01 cm. Pulmonic Valve: The pulmonic valve was normal in structure. Pulmonic valve regurgitation is trivial. No evidence of pulmonic stenosis. Aorta: Aortic dilatation noted. There is borderline dilatation of the aortic root, measuring 40 mm. Pulmonary Artery: The pulmonary artery is of normal size. Venous: The inferior vena cava was not well visualized. IAS/Shunts: The interatrial septum was not well visualized.  LEFT VENTRICLE PLAX 2D LVIDd:         4.50 cm   Diastology LVIDs:         3.20 cm   LV e' medial:    10.80 cm/s LV PW:         1.00 cm   LV E/e' medial:  6.8 LV IVS:        1.10 cm   LV e' lateral:   9.46 cm/s LVOT diam:     1.90 cm   LV E/e' lateral: 7.8 LV SV:         69 LV SV Index:   32 LVOT Area:     2.84 cm  RIGHT VENTRICLE RV Basal diam:  3.30 cm RV Mid diam:    2.80 cm RV S prime:     11.70 cm/s TAPSE (M-mode): 2.6 cm LEFT ATRIUM              Index        RIGHT ATRIUM           Index LA diam:        4.15 cm  1.91 cm/m   RA Area:  17.60 cm LA Vol (A2C):   110.0 ml 50.71 ml/m  RA Volume:   41.50 ml  19.13 ml/m LA Vol (A4C):   85.5 ml  39.41 ml/m LA Biplane Vol: 98.8 ml  45.54 ml/m  AORTIC VALVE                     PULMONIC VALVE AV Area (Vmax):    1.67 cm      PV Vmax:          1.09 m/s AV Area (Vmean):   2.02 cm      PV Peak grad:     4.8 mmHg AV Area (VTI):     2.01 cm      PR End Diast Vel: 3.58 msec AV Vmax:           185.00 cm/s AV Vmean:          108.000 cm/s AV VTI:            0.343 m AV Peak Grad:      13.7 mmHg AV Mean Grad:      6.0 mmHg LVOT Vmax:         109.00 cm/s LVOT Vmean:        76.900 cm/s LVOT VTI:          0.243 m LVOT/AV VTI ratio: 0.71 AI PHT:            488 msec MITRAL VALVE MV Area (PHT): 3.89 cm    SHUNTS MV Area VTI:   2.08  cm    Systemic VTI:  0.24 m MV Peak grad:  2.9 mmHg    Systemic Diam: 1.90 cm MV Mean grad:  2.0 mmHg MV Vmax:       0.85 m/s MV Vmean:      60.5 cm/s MV Decel Time: 195 msec MV E velocity: 73.40 cm/s MV A velocity: 56.60 cm/s MV E/A ratio:  1.30 MV A Prime:    11.4 cm/s Yvonne Kendall MD Electronically signed by Yvonne Kendall MD Signature Date/Time: 02/19/2023/10:50:01 AM    Final    CT Angio Chest Pulmonary Embolism (PE) W or WO Contrast  Result Date: 02/18/2023 CLINICAL DATA:  Generalized weakness. Unable to ambulate as well as normal. Melanoma excision with sentinel lymph node biopsy on the left on 02/15/2023. PE suspected. Abdominal aortic aneurysm follow-up. EXAM: CT ANGIOGRAPHY CHEST, ABDOMEN AND PELVIS TECHNIQUE: Non-contrast CT of the chest was initially obtained. Multidetector CT imaging through the chest, abdomen and pelvis was performed using the standard protocol during bolus administration of intravenous contrast. Multiplanar reconstructed images and MIPs were obtained and reviewed to evaluate the vascular anatomy. RADIATION DOSE REDUCTION: This exam was performed according to the departmental dose-optimization program which includes automated exposure control, adjustment of the mA and/or kV according to patient size and/or use of iterative reconstruction technique. CONTRAST:  OMNIPAQUE IOHEXOL 350 MG/ML SOLN COMPARISON:  Chest radiograph 02/17/2023; PET/CT 12/28/2022; CT abdomen and pelvis 10/17/2022; CT chest abdomen and pelvis 10/10/2022 FINDINGS: CTA CHEST FINDINGS Cardiovascular: Negative for acute pulmonary embolism. Normal caliber thoracic aorta without dissection. Coronary artery and aortic atherosclerotic calcification. No pericardial effusion. Right PICC tip in the low SVC. Mediastinum/Nodes: No thoracic adenopathy. Unremarkable trachea and esophagus. Lungs/Pleura: Bronchial wall thickening and scattered mucous plugging greatest in the right lower lobe. Bilateral lower lung  bronchiolectasis. Scattered centrilobular and budding tree micro nodules greatest in the lingula. Decreased size of the thick-walled cavitary mass in the lingula compared with PET/CT 12/28/2022.  This measures 3.2 x 1.7 cm today, previously 3.2 x 2.5 cm. Small right pleural effusion and associated atelectasis. No pneumothorax. Musculoskeletal: Subcutaneous emphysema in the anterolateral left chest wall. Loosely organized left axillary postprocedural hematoma/seroma measuring 4.9 x 2.2 x 4.6 cm related to recent axillary node biopsy. Thoracic kyphosis. No acute fracture. Fused anterior enthesophytes throughout the cervical and thoracic spine. Review of the MIP images confirms the above findings. CTA ABDOMEN AND PELVIS FINDINGS VASCULAR Aorta: Infrarenal abdominal aortic aneurysm again measuring up to 5.0 cm in greatest diameter. Aortic calcified atherosclerosis. No dissection. Celiac: No hemodynamically significant stenosis, aneurysm, or dissection. SMA: No hemodynamically significant stenosis, aneurysm, or dissection. Renals: No hemodynamically significant stenosis, aneurysm, or dissection. IMA: Patent. Inflow: Calcified atherosclerosis without significant stenosis. No aneurysm or dissection. Veins: No obvious venous abnormality within the limitations of this arterial phase study. Review of the MIP images confirms the above findings. NON-VASCULAR Hepatobiliary: Cholecystectomy. No focal hepatic lesion. Unchanged biliary prominence. Pancreas: Fatty atrophy.  No acute abnormality. Spleen: Unremarkable. Adrenals/Urinary Tract: Stable adrenal glands. No urinary calculi or hydronephrosis. Unchanged 1.4 cm cystic lesion in the posterior right kidney. Unremarkable bladder. Stomach/Bowel: Normal caliber large and small bowel. Colonic diverticulosis without diverticulitis. Stomach is within normal limits. Normal appendix. Lymphatic: No lymphadenopathy. Reproductive: Unremarkable. Other: No free intraperitoneal fluid or air.  Musculoskeletal: Demineralization. Bilateral femoral head AVN. Fused anterior syndesmophytes in the lumbar spine. Review of the MIP images confirms the above findings. IMPRESSION: 1. Negative for acute pulmonary embolism. 2. Infrarenal abdominal aortic aneurysm again measuring up to 5.0 cm in greatest diameter. Recommend follow-up CT/MR every 6 months and vascular consultation if not obtained. This recommendation follows ACR consensus guidelines: White Paper of the ACR Incidental Findings Committee II on Vascular Findings. J Am Coll Radiol 2013; 10:789-794. 3. Decreased size of the cavitary lesion in the lingula since PET/CT 12/28/2022. 4. Pulmonary findings compatible with infectious/inflammatory bronchiolitis. 5. Small right pleural effusion and associated atelectasis. 6. Loosely organized left axillary postprocedural hematoma/seroma measuring 4.9 x 2.2 x 4.6 cm related to recent axillary node biopsy. 7. 1.4 cm cystic lesion in the posterior right kidney. If clinically appropriate, renal mass protocol MRI could be obtained for further evaluation. Aortic Atherosclerosis (ICD10-I70.0). Electronically Signed   By: Minerva Fester M.D.   On: 02/18/2023 01:03   CT Angio Abd/Pel w/ and/or w/o  Result Date: 02/18/2023 CLINICAL DATA:  Generalized weakness. Unable to ambulate as well as normal. Melanoma excision with sentinel lymph node biopsy on the left on 02/15/2023. PE suspected. Abdominal aortic aneurysm follow-up. EXAM: CT ANGIOGRAPHY CHEST, ABDOMEN AND PELVIS TECHNIQUE: Non-contrast CT of the chest was initially obtained. Multidetector CT imaging through the chest, abdomen and pelvis was performed using the standard protocol during bolus administration of intravenous contrast. Multiplanar reconstructed images and MIPs were obtained and reviewed to evaluate the vascular anatomy. RADIATION DOSE REDUCTION: This exam was performed according to the departmental dose-optimization program which includes automated  exposure control, adjustment of the mA and/or kV according to patient size and/or use of iterative reconstruction technique. CONTRAST:  OMNIPAQUE IOHEXOL 350 MG/ML SOLN COMPARISON:  Chest radiograph 02/17/2023; PET/CT 12/28/2022; CT abdomen and pelvis 10/17/2022; CT chest abdomen and pelvis 10/10/2022 FINDINGS: CTA CHEST FINDINGS Cardiovascular: Negative for acute pulmonary embolism. Normal caliber thoracic aorta without dissection. Coronary artery and aortic atherosclerotic calcification. No pericardial effusion. Right PICC tip in the low SVC. Mediastinum/Nodes: No thoracic adenopathy. Unremarkable trachea and esophagus. Lungs/Pleura: Bronchial wall thickening and scattered mucous plugging greatest in the right  lower lobe. Bilateral lower lung bronchiolectasis. Scattered centrilobular and budding tree micro nodules greatest in the lingula. Decreased size of the thick-walled cavitary mass in the lingula compared with PET/CT 12/28/2022. This measures 3.2 x 1.7 cm today, previously 3.2 x 2.5 cm. Small right pleural effusion and associated atelectasis. No pneumothorax. Musculoskeletal: Subcutaneous emphysema in the anterolateral left chest wall. Loosely organized left axillary postprocedural hematoma/seroma measuring 4.9 x 2.2 x 4.6 cm related to recent axillary node biopsy. Thoracic kyphosis. No acute fracture. Fused anterior enthesophytes throughout the cervical and thoracic spine. Review of the MIP images confirms the above findings. CTA ABDOMEN AND PELVIS FINDINGS VASCULAR Aorta: Infrarenal abdominal aortic aneurysm again measuring up to 5.0 cm in greatest diameter. Aortic calcified atherosclerosis. No dissection. Celiac: No hemodynamically significant stenosis, aneurysm, or dissection. SMA: No hemodynamically significant stenosis, aneurysm, or dissection. Renals: No hemodynamically significant stenosis, aneurysm, or dissection. IMA: Patent. Inflow: Calcified atherosclerosis without significant stenosis. No  aneurysm or dissection. Veins: No obvious venous abnormality within the limitations of this arterial phase study. Review of the MIP images confirms the above findings. NON-VASCULAR Hepatobiliary: Cholecystectomy. No focal hepatic lesion. Unchanged biliary prominence. Pancreas: Fatty atrophy.  No acute abnormality. Spleen: Unremarkable. Adrenals/Urinary Tract: Stable adrenal glands. No urinary calculi or hydronephrosis. Unchanged 1.4 cm cystic lesion in the posterior right kidney. Unremarkable bladder. Stomach/Bowel: Normal caliber large and small bowel. Colonic diverticulosis without diverticulitis. Stomach is within normal limits. Normal appendix. Lymphatic: No lymphadenopathy. Reproductive: Unremarkable. Other: No free intraperitoneal fluid or air. Musculoskeletal: Demineralization. Bilateral femoral head AVN. Fused anterior syndesmophytes in the lumbar spine. Review of the MIP images confirms the above findings. IMPRESSION: 1. Negative for acute pulmonary embolism. 2. Infrarenal abdominal aortic aneurysm again measuring up to 5.0 cm in greatest diameter. Recommend follow-up CT/MR every 6 months and vascular consultation if not obtained. This recommendation follows ACR consensus guidelines: White Paper of the ACR Incidental Findings Committee II on Vascular Findings. J Am Coll Radiol 2013; 10:789-794. 3. Decreased size of the cavitary lesion in the lingula since PET/CT 12/28/2022. 4. Pulmonary findings compatible with infectious/inflammatory bronchiolitis. 5. Small right pleural effusion and associated atelectasis. 6. Loosely organized left axillary postprocedural hematoma/seroma measuring 4.9 x 2.2 x 4.6 cm related to recent axillary node biopsy. 7. 1.4 cm cystic lesion in the posterior right kidney. If clinically appropriate, renal mass protocol MRI could be obtained for further evaluation. Aortic Atherosclerosis (ICD10-I70.0). Electronically Signed   By: Minerva Fester M.D.   On: 02/18/2023 01:03   DG Chest  Port 1 View  Result Date: 02/17/2023 CLINICAL DATA:  Hypoxia. EXAM: PORTABLE CHEST 1 VIEW COMPARISON:  Chest radiograph dated 01/11/2023. FINDINGS: The heart size and mediastinal contours are within normal limits. There is mild right basilar atelectasis/airspace disease. A small right pleural effusion may contribute. Left interstitial and airspace opacities in the left mid lung appear similar to prior exam and may reflect an incompletely resolved cavitary lesion. No left pleural effusion. No pneumothorax on either side. A right upper extremity peripherally inserted central venous catheter tip overlies the superior cavoatrial junction. Degenerative changes are seen in the spine. IMPRESSION: 1. Mild right basilar atelectasis/airspace disease. A small right pleural effusion may contribute. 2. Left interstitial and airspace opacities in the left mid lung appear similar to prior exam and may reflect an incompletely resolved cavitary lesion. Electronically Signed   By: Romona Curls M.D.   On: 02/17/2023 20:08   (Echo, Carotid, EGD, Colonoscopy, ERCP)    Subjective: Pt c/o pain  Discharge Exam: Vitals:   02/21/23 2226 02/22/23 0812  BP: (!) 148/76 (!) 127/108  Pulse: 61 98  Resp: 20 17  Temp: (!) 97.3 F (36.3 C) 99.1 F (37.3 C)  SpO2: 92% 93%   Vitals:   02/21/23 1527 02/21/23 2226 02/22/23 0457 02/22/23 0812  BP: 119/66 (!) 148/76  (!) 127/108  Pulse: 65 61  98  Resp: 17 20  17   Temp: 98.2 F (36.8 C) (!) 97.3 F (36.3 C)  99.1 F (37.3 C)  TempSrc:  Oral    SpO2: 97% 92%  93%  Weight:   104.3 kg   Height:        General: Pt is confused Cardiovascular: S1/S2 +, no rubs, no gallops Respiratory: decreased breath sounds b/l  Abdominal: Soft, NT, obese, bowel sounds + Extremities:no cyanosis    The results of significant diagnostics from this hospitalization (including imaging, microbiology, ancillary and laboratory) are listed below for reference.     Microbiology: Recent  Results (from the past 240 hour(s))  SARS Coronavirus 2 by RT PCR (hospital order, performed in [facility] hospital lab) *cepheid single result test* Anterior Nasal Swab     Status: None   Collection Time: 02/17/23  6:30 PM   Specimen: Anterior Nasal Swab  Result Value Ref Range Status   SARS Coronavirus 2 by RT PCR NEGATIVE NEGATIVE Final    Comment: (NOTE) SARS-CoV-2 target nucleic acids are NOT DETECTED.  The SARS-CoV-2 RNA is generally detectable in upper and lower respiratory specimens during the acute phase of infection. The lowest concentration of SARS-CoV-2 viral copies this assay can detect is 250 copies / mL. A negative result does not preclude SARS-CoV-2 infection and should not be used as the sole basis for treatment or other patient management decisions.  A negative result may occur with improper specimen collection / handling, submission of specimen other than nasopharyngeal swab, presence of viral mutation(s) within the areas targeted by this assay, and inadequate number of viral copies (<250 copies / mL). A negative result must be combined with clinical observations, patient history, and epidemiological information.  Fact Sheet for Patients:   RoadLapTop.co.za  Fact Sheet for Healthcare Providers: http://kim-miller.com/  This test is not yet approved or  cleared by the [place] FDA and has been authorized for detection and/or diagnosis of SARS-CoV-2 by FDA under an Emergency Use Authorization (EUA).  This EUA will remain in effect (meaning this test can be used) for the duration of the COVID-19 declaration under Section 564(b)(1) of the Act, 21 U.S.C. section 360bbb-3(b)(1), unless the authorization is terminated or revoked sooner.  Performed at [facility], [address] [place], [place] [address]   Culture, blood (Routine X 2) w Reflex to ID Panel     Status: None (Preliminary result)   Collection  Time: 02/17/23  8:34 PM   Specimen: BLOOD RIGHT ARM  Result Value Ref Range Status   Specimen Description BLOOD RIGHT ARM  Final   Special Requests   Final    BOTTLES DRAWN AEROBIC AND ANAEROBIC Blood Culture adequate volume   Culture   Final    NO GROWTH 4 DAYS Performed at [facility], [address], [place], [place] [address]    Report Status PENDING  Incomplete  MRSA Next Gen by PCR, Nasal     Status: Abnormal   Collection Time: 02/18/23 10:00 AM   Specimen: Nasal Mucosa; Nasal Swab  Result Value Ref Range Status   MRSA by PCR Next  Gen DETECTED (A) NOT DETECTED Final    Comment: RESULT CALLED TO, READ BACK BY AND VERIFIED WITH:  ANGELA MAYFIELD 1435 02/18/2023 CP (NOTE) The GeneXpert MRSA Assay (FDA approved for NASAL specimens only), is one component of a comprehensive MRSA colonization surveillance program. It is not intended to diagnose MRSA infection nor to guide or monitor treatment for MRSA infections. Test performance is not FDA approved in patients less than 27 years old. Performed at [facility], [address] [place], [place] [address]   Culture, blood (Routine X 2) w Reflex to ID Panel     Status: None (Preliminary result)   Collection Time: 02/18/23 10:02 AM   Specimen: BLOOD  Result Value Ref Range Status   Specimen Description BLOOD BLOOD RIGHT HAND  Final   Special Requests   Final    BOTTLES DRAWN AEROBIC AND ANAEROBIC Blood Culture adequate volume   Culture   Final    NO GROWTH 4 DAYS Performed at [facility], [address] [place], [place] [address]    Report Status PENDING  Incomplete     Labs: BNP (last 3 results) Recent Labs    04/30/22 1214 05/27/22 0801  BNP 182.9* 156.0*   Basic Metabolic Panel: Recent Labs  Lab 02/17/23 2033 02/18/23 1002 02/19/23 1021 02/21/23 0533 02/22/23 0445  NA 139 140 141 138 138  K 4.2 4.5 4.2 3.9 3.9  CL 104 107 108 105 104  CO2 26 25 24 25 27   GLUCOSE 124* 123*  134* 114* 112*  BUN 22 21 16 16 21   CREATININE 1.18 1.08 1.04 1.18 0.98  CALCIUM 8.5* 8.3* 8.6* 8.6* 8.4*  MG 2.0 2.0 1.9 2.4 2.0   Liver Function Tests: No results for input(s): "AST", "ALT", "ALKPHOS", "BILITOT", "PROT", "ALBUMIN" in the last 168 hours. No results for input(s): "LIPASE", "AMYLASE" in the last 168 hours. No results for input(s): "AMMONIA" in the last 168 hours. CBC: Recent Labs  Lab 02/17/23 2033 02/18/23 1002  WBC 5.2 6.3  HGB 8.6* 9.1*  HCT 29.0* 31.2*  MCV 97.6 100.0  PLT 137* 122*   Cardiac Enzymes: No results for input(s): "CKTOTAL", "CKMB", "CKMBINDEX", "TROPONINI" in the last 168 hours. BNP: Invalid input(s): "POCBNP" CBG: Recent Labs  Lab 02/19/23 0717 02/20/23 0805 02/21/23 0505 02/22/23 0541  GLUCAP 113* 123* 106* 82   D-Dimer No results for input(s): "DDIMER" in the last 72 hours. Hgb A1c No results for input(s): "HGBA1C" in the last 72 hours. Lipid Profile Recent Labs    02/21/23 0533  CHOL 118  HDL 28*  LDLCALC 66  TRIG 098  CHOLHDL 4.2   Thyroid function studies No results for input(s): "TSH", "T4TOTAL", "T3FREE", "THYROIDAB" in the last 72 hours.  Invalid input(s): "FREET3" Anemia work up No results for input(s): "VITAMINB12", "FOLATE", "FERRITIN", "TIBC", "IRON", "RETICCTPCT" in the last 72 hours. Urinalysis    Component Value Date/Time   COLORURINE YELLOW 02/18/2023 0218   APPEARANCEUR CLEAR 02/18/2023 0218   LABSPEC 1.015 02/18/2023 0218   PHURINE 5.5 02/18/2023 0218   GLUCOSEU NEGATIVE 02/18/2023 0218   HGBUR NEGATIVE 02/18/2023 0218   BILIRUBINUR NEGATIVE 02/18/2023 0218   BILIRUBINUR Negative 07/12/2020 1348   KETONESUR NEGATIVE 02/18/2023 0218   PROTEINUR NEGATIVE 02/18/2023 0218   UROBILINOGEN 0.2 07/12/2020 1348   NITRITE NEGATIVE 02/18/2023 0218   LEUKOCYTESUR NEGATIVE 02/18/2023 0218   Sepsis Labs Recent Labs  Lab 02/17/23 2033 02/18/23 1002  WBC 5.2 6.3   Microbiology Recent Results (from the  past 240 hour(s))  SARS Coronavirus 2 by RT PCR (hospital order, performed in [facility] hospital lab) *cepheid single result test* Anterior Nasal Swab     Status: None   Collection Time: 02/17/23  6:30 PM   Specimen: Anterior Nasal Swab  Result Value Ref Range Status   SARS Coronavirus 2 by RT PCR NEGATIVE NEGATIVE Final    Comment: (NOTE) SARS-CoV-2 target nucleic acids are NOT DETECTED.  The SARS-CoV-2 RNA is generally detectable in upper and lower respiratory specimens during the acute phase of infection. The lowest concentration of SARS-CoV-2 viral copies this assay can detect is 250 copies / mL. A negative result does not preclude SARS-CoV-2 infection and should not be used as the sole basis for treatment or other patient management decisions.  A negative result may occur with improper specimen collection / handling, submission of specimen other than nasopharyngeal swab, presence of viral mutation(s) within the areas targeted by this assay, and inadequate number of viral copies (<250 copies / mL). A negative result must be combined with clinical observations, patient history, and epidemiological information.  Fact Sheet for Patients:   RoadLapTop.co.za  Fact Sheet for Healthcare Providers: http://kim-miller.com/  This test is not yet approved or  cleared by the [place] FDA and has been authorized for detection and/or diagnosis of SARS-CoV-2 by FDA under an Emergency Use Authorization (EUA).  This EUA will remain in effect (meaning this test can be used) for the duration of the COVID-19 declaration under Section 564(b)(1) of the Act, 21 U.S.C. section 360bbb-3(b)(1), unless the authorization is terminated or revoked sooner.  Performed at [facility], [address] [place], [place] [address]   Culture, blood (Routine X 2) w Reflex to ID Panel     Status: None (Preliminary result)   Collection Time: 02/17/23   8:34 PM   Specimen: BLOOD RIGHT ARM  Result Value Ref Range Status   Specimen Description BLOOD RIGHT ARM  Final   Special Requests   Final    BOTTLES DRAWN AEROBIC AND ANAEROBIC Blood Culture adequate volume   Culture   Final    NO GROWTH 4 DAYS Performed at [facility], [address], [place], [place] [address]    Report Status PENDING  Incomplete  MRSA Next Gen by PCR, Nasal     Status: Abnormal   Collection Time: 02/18/23 10:00 AM   Specimen: Nasal Mucosa; Nasal Swab  Result Value Ref Range Status   MRSA by PCR Next Gen DETECTED (A) NOT DETECTED Final    Comment: RESULT CALLED TO, READ BACK BY AND VERIFIED WITH:  ANGELA MAYFIELD 1435 02/18/2023 CP (NOTE) The GeneXpert MRSA Assay (FDA approved for NASAL specimens only), is one component of a comprehensive MRSA colonization surveillance program. It is not intended to diagnose MRSA infection nor to guide or monitor treatment for MRSA infections. Test performance is not FDA approved in patients less than 10 years old. Performed at [facility], [address] [place], [place] [address]   Culture, blood (Routine X 2) w Reflex to ID Panel     Status: None (Preliminary result)   Collection Time: 02/18/23 10:02 AM   Specimen: BLOOD  Result Value Ref Range Status   Specimen Description BLOOD BLOOD RIGHT HAND  Final   Special Requests   Final    BOTTLES DRAWN AEROBIC AND ANAEROBIC Blood Culture adequate volume   Culture   Final    NO GROWTH 4 DAYS Performed at [facility] [place] [facility], [address] [place], [place] [address]    Report Status PENDING  Incomplete     Time coordinating discharge: Over 30 minutes  SIGNED:   Charise Killian, MD  [facility] 02/22/2023, 12:40 PM Pager   If 7PM-7AM, please contact night-coverage [email or web address]

## 2023-02-22 NOTE — TOC Progression Note (Signed)
Transition of Care [facility]) - Progression Note    Patient Details  Name: Aaron Gibbs MRN: [id number] Date of Birth: 06/10/1935  [facility]) CM/SW Contact  Garret Reddish, RN Phone Number: 02/22/2023, 12:19 PM  Clinical Narrative:     Chart reviewed.  Noted that patient was admitted with Ventricular tachycardia.    Noted that [facility] has been consulted for home hospice.   I have spoken with patient's daughter [name].  She informs me that prior to admission her father lived in an appartment with a caregiver that provides 24 hour care for her father.  She reports that she would like for her father to discharge home with [facility].  I have made [name] aware that [facility] will provide home 02.  [name] with [facility] is following patient while in the hospital.  [name] reports that patient's caregiver Candice assist with ADL's and takes to appointments.    [name] is agreeable to ambulance transport when her father is stable for discharge.    TOC will continue to follow for discharge planning.      Barriers to Discharge: Continued Medical Work up  Expected Discharge Plan and Services     Post Acute Care Choice: Home Health, Skilled Nursing Facility Living arrangements for the past 2 months: Single Family Home                                       Social Determinants of Health (SDOH) Interventions SDOH Screenings   Food Insecurity: No Food Insecurity (02/18/2023)  Housing: Low Risk  (02/18/2023)  Transportation Needs: No Transportation Needs (02/18/2023)  Utilities: Not At Risk (02/18/2023)  Alcohol Screen: Low Risk  (02/02/2023)  Depression (PHQ2-9): Low Risk  (02/02/2023)  Recent Concern: Depression (PHQ2-9) - Medium Risk (12/27/2022)  Financial Resource Strain: Low Risk  (07/13/2022)  Physical Activity: Inactive (07/13/2022)  Social Connections: Socially Isolated (07/13/2022)  Stress: Stress Concern Present (07/13/2022)  Tobacco Use: Medium  Risk (02/17/2023)    Readmission Risk Interventions    02/20/2023    9:47 AM  Readmission Risk Prevention Plan  Transportation Screening Complete  Medication Review (RN Care Manager) Referral to Pharmacy  PCP or Specialist appointment within 3-5 days of discharge Complete  HRI or Home Care Consult Complete  SW Recovery Care/Counseling Consult Complete  Palliative Care Screening Not Applicable  Skilled Nursing Facility Not Applicable

## 2023-02-22 NOTE — Progress Notes (Signed)
   Patient Name: Aaron Gibbs Date of Encounter: 02/22/2023 [facility] Cardiologist: Julien Nordmann, MD   Interval Summary  .    Patient seen on AM rounds. Family remains at the bedside. Telemetry was removed yesterday. Still has complaints of neck pain. Denies any chest pain or shortness of breath.  Vital Signs .    Vitals:   02/21/23 1353 02/21/23 1527 02/21/23 2226 02/22/23 0457  BP:  119/66 (!) 148/76   Pulse: 65 65 61   Resp:  17 20   Temp:  98.2 F (36.8 C) (!) 97.3 F (36.3 C)   TempSrc:   Oral   SpO2: 93% 97% 92%   Weight:    104.3 kg  Height:        Intake/Output Summary (Last 24 hours) at 02/22/2023 0801 Last data filed at 02/21/2023 2249 Gross per 24 hour  Intake 123 ml  Output 550 ml  Net -427 ml      02/22/2023    4:57 AM 02/21/2023    5:00 AM 02/20/2023    5:00 AM  Last 3 Weights  Weight (lbs) 229 lb 15 oz 229 lb 11.5 oz 219 lb 9.3 oz  Weight (kg) 104.3 kg 104.2 kg 99.6 kg      Telemetry/ECG    Not currently on telemetry - Personally Reviewed  Physical Exam .   GEN: No acute distress.   Neck: Unable to assess JVD due to neck brace  Cardiac: RRR, distant, no murmurs, rubs, or gallops.  Respiratory: Diminished to auscultation bilaterally. GI: Soft, nontender, non-distended  MS: trace pretibial edema  Assessment & Plan .     Ventricular tachycardia -Presented to the ED on 8/21 with weakness, malaise was noted to have runs of VT -Started on IV amiodarone and transition to oral amiodarone -Continue on amiodarone 400 mg twice daily for 2 weeks and then reduce to 200 mg twice daily for 2 weeks and then 200 mg daily -Carvedilol increased to 12.5 mg twice daily -Continues to be a poor candidate for ischemic evaluation as send of chronic pain/neck pain preventing him from lying flat for nuclear study or heart catheterization -LDL 66, continued on rosuvastatin 5 mg daily  Hypertension -blood pressure 127/108 -Carvedilol increased to 12.5 mg  twice daily -Vital signs per unit protocol  Infrarenal abdominal aortic aneurysm -measures 5 centimeters on CT -plan follow-up with vascular surgery  Cervical fracture/neck pain -chronic C2 fracture -c-collar intact -pain management per IM  5.  Vertebral artery disease -Seen by neuro -conservative management recommended  For questions or updates, please contact [facility] Please consult [email or web address] for contact info under        Signed, Raymar Joiner, NP

## 2023-02-22 NOTE — Progress Notes (Signed)
                                                     Palliative Care Progress Note   Patient Name: Aaron Gibbs       Date: 02/22/2023 DOB: 1934-10-09  Age: 88 y.o. MRN#: [id number] Attending Physician: Charise Killian, MD Primary Care Physician: Smitty Cords, DO Admit Date: 02/17/2023  Chart reviewed. No acute palliative needs today.  Plan remains for patient to d/c with [facility] services at home today  Thank you for allowing the Palliative Medicine Team to assist in the care of Aaron Gibbs.  Samara Deist L. Bonita Quin, DNP, FNP-BC Palliative Medicine Team    No charge

## 2023-02-22 NOTE — TOC Progression Note (Signed)
Transition of Care [facility]) - Progression Note    Patient Details  Name: Bernard Banken MRN: [id number] Date of Birth: [birth year]  Transition of Care [facility]) CM/SW Contact  Garret Reddish, RN Phone Number: 02/22/2023, 2:21 PM  Clinical Narrative:     [facility] arranged for transport home.  I have informed patient's daughter [name].    I have informed staff nurse of the above information.      Barriers to Discharge: Continued Medical Work up  Expected Discharge Plan and Services     Post Acute Care Choice: Home Health, Skilled Nursing Facility Living arrangements for the past 2 months: Single Family Home Expected Discharge Date: 02/22/23                                     Social Determinants of Health (SDOH) Interventions SDOH Screenings   Food Insecurity: No Food Insecurity (02/18/2023)  Housing: Low Risk  (02/18/2023)  Transportation Needs: No Transportation Needs (02/18/2023)  Utilities: Not At Risk (02/18/2023)  Alcohol Screen: Low Risk  (02/02/2023)  Depression (PHQ2-9): Low Risk  (02/02/2023)  Recent Concern: Depression (PHQ2-9) - Medium Risk (12/27/2022)  Financial Resource Strain: Low Risk  (07/13/2022)  Physical Activity: Inactive (07/13/2022)  Social Connections: Socially Isolated (07/13/2022)  Stress: Stress Concern Present (07/13/2022)  Tobacco Use: Medium Risk (02/17/2023)    Readmission Risk Interventions    02/20/2023    9:47 AM  Readmission Risk Prevention Plan  Transportation Screening Complete  Medication Review (RN Care Manager) Referral to Pharmacy  PCP or Specialist appointment within 3-5 days of discharge Complete  HRI or Home Care Consult Complete  SW Recovery Care/Counseling Consult Complete  Palliative Care Screening Not Applicable  Skilled Nursing Facility Not Applicable

## 2023-02-22 NOTE — Plan of Care (Signed)
  Problem: Education: Goal: Knowledge of General Education information will improve Description: Including pain rating scale, medication(s)/side effects and non-pharmacologic comfort measures Outcome: Not Progressing   Problem: Health Behavior/Discharge Planning: Goal: Ability to manage health-related needs will improve Outcome: Not Progressing   

## 2023-02-22 NOTE — Progress Notes (Signed)
Discharge instructions provided to Care giver. She acknowledge understanding. Awaiting for EMS to transport  home.

## 2023-02-23 ENCOUNTER — Telehealth: Payer: Self-pay | Admitting: General Surgery

## 2023-02-23 LAB — CULTURE, BLOOD (ROUTINE X 2)
Culture: NO GROWTH
Culture: NO GROWTH
Special Requests: ADEQUATE
Special Requests: ADEQUATE

## 2023-02-23 NOTE — Telephone Encounter (Signed)
Discussed pathology with patient's caregiver and left message for daughter.  The deep margin is positive, but the other margins do appear to be clear.  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  I advised that I would typically refer to the oncology department and they would most likely discussed antibody treatment.  However, the patient had to be admitted to the hospital recently and is still very weak.  There was discussion of going home with hospice.  We will have the referral present but would like to make sure that he would like to go see them before he has to go to another appointment.

## 2023-02-26 NOTE — Group Note (Deleted)

## 2023-03-01 ENCOUNTER — Ambulatory Visit: Payer: Self-pay

## 2023-03-01 NOTE — Telephone Encounter (Signed)
  Chief Complaint: severe neck pain  Symptoms: c/o jaw, teeth, left arm pain and swelling Frequency: 2 months Pertinent Negatives: Patient denies chest pain SOB weakness or numbness to arm Disposition: [] ED /[] Urgent Care (no appt availability in office) / [] Appointment(In office/virtual)/ []  [facility]/ [] Home Care/ [x] Refused Recommended Disposition /[]  Mobile Bus/ []  Follow-up with PCP Additional Notes: Refused UC and ED. No appts. Caregiver stated she is going to try and talk pt into calling paramedics. Attempted to call office, no answer. Routing this to off . Reason for Disposition  [1] SEVERE neck pain (e.g., excruciating, unable to do any normal activities) AND [2] not improved after 2 hours of pain medicine  Answer Assessment - Initial Assessment Questions 1. ONSET: "When did the pain begin?"    2 month  2. LOCATION: "Where does it hurt?"      C2  3. PATTERN "Does the pain come and go, or has it been constant since it started?"      constant 4. SEVERITY: "How bad is the pain?"  (Scale 1-10; or mild, moderate, severe)   - NO PAIN (0): no pain or only slight stiffness    - MILD (1-3): doesn't interfere with normal activities    - MODERATE (4-7): interferes with normal activities or awakens from sleep    - SEVERE (8-10):  excruciating pain, unable to do any normal activities      severe 5. RADIATION: "Does the pain go anywhere else, shoot into your arms?"     Teeth jaw arm  6. CORD SYMPTOMS: "Any weakness or numbness of the arms or legs?"     No  7. CAUSE: "What do you think is causing the neck pain?"     C2 neck pain  9. OTHER SYMPTOMS: "Do you have any other symptoms?" (e.g., headache, fever, chest pain, difficulty breathing, neck swelling)    Swelling to left arm  10. PREGNANCY: "Is there any chance you are pregnant?" "When was your last menstrual period?"       N/a  Protocols used: Neck Pain or Stiffness-A-AH

## 2023-03-01 NOTE — Telephone Encounter (Signed)
Given that there are no appointments in the office, would at least recommend urgent care for further evaluation

## 2023-03-02 ENCOUNTER — Encounter: Payer: Self-pay | Admitting: *Deleted

## 2023-03-02 ENCOUNTER — Other Ambulatory Visit: Payer: Self-pay | Admitting: *Deleted

## 2023-03-02 NOTE — Transitions of Care (Post Inpatient/ED Visit) (Signed)
03/02/2023  Name: Aaron Gibbs MRN: [id number] DOB: [birth year]  Today's TOC FU Call Status: Today's TOC FU Call Status:: Successful TOC FU Call Completed TOC FU Call Complete Date: 03/02/23 Patient's Name and Date of Birth confirmed.  Transition Care Management Follow-up Telephone Call Date of Discharge: 02/22/23 Discharge Facility: [facility]) Type of Discharge: Inpatient Admission Primary Inpatient Discharge Diagnosis:: Ventricular Tachycardia How have you been since you were released from the hospital?: Better  Items Reviewed: Did you receive and understand the discharge instructions provided?: Yes Medications obtained,verified, and reconciled?: Yes (Medications Reviewed) Any new allergies since your discharge?: No Dietary orders reviewed?: Yes Type of Diet Ordered:: Heart healthy Do you have support at home?: Yes Name of Support/Comfort Primary Source: live in caregiver Wynona Meals  Medications Reviewed Today: Medications Reviewed Today     Reviewed by Audrie Gallus, RN (Registered Nurse) on 03/02/23 at 1537  Med List Status: <None>   Medication Order Taking? Sig Documenting Provider Last Dose Status Informant  acetaminophen (TYLENOL) 500 MG tablet [id number] Yes Take 1,000 mg by mouth in the morning, at noon, and at bedtime. [provider] Taking Active Care Giver, Pharmacy Records  albuterol (VENTOLIN HFA) 108 (90 Base) MCG/ACT inhaler [id number] Yes Inhale 2 puffs into the lungs every 4 (four) hours as needed for wheezing or shortness of breath. [provider] Taking Active Care Giver, Pharmacy Records  amiodarone (PACERONE) 200 MG tablet [id number] Yes Take 1 tablet (200 mg total) by mouth 2 (two) times daily. Only start this script after completing amiodarone 400 mg BID x 14 days. Charise Killian, MD Taking Active   amiodarone (PACERONE) 400 MG tablet [id number] Yes Take 1 tablet (400 mg total) by mouth 2 (two) times  daily for 14 days. Charise Killian, MD Taking Active   buprenorphine Lavera Guise) 10 MCG/HR [place] [id number] Yes Place 1 patch onto the skin once a week for 28 days. Apply only 1 patch at a time and alternate sites weekly. Delano Metz, MD Taking Active Care Giver, Pharmacy Records           Med Note Virgie Dad   Thu Feb 15, 2023  7:08 AM) Patch is not currently on patient. Removed prior to arrival per patient  carvedilol (COREG) 12.5 MG tablet [id number] Yes Take 1 tablet (12.5 mg total) by mouth 2 (two) times daily. Smitty Cords, DO Taking Active Care Giver, Pharmacy Records  COSOPT 22.3-6.8 MG/ML ophthalmic solution [id number] Yes Place 1 drop into both eyes 2 (two) times daily. [provider] Taking Active Care Giver, Pharmacy Records  diclofenac Sodium (VOLTAREN) 1 % GEL [id number] Yes Apply 2 g topically at bedtime. Apply to both knees twice daily  Patient taking differently: Apply 2 g topically at bedtime. Apply to both knees at night   Althea Charon Netta Neat, DO Taking Active Care Giver, Pharmacy Records  doxycycline (VIBRA-TABS) 100 MG tablet [id number] Yes Take 1 tablet (100 mg total) by mouth 2 (two) times daily. [name], MD Taking Active   DULoxetine (CYMBALTA) 30 MG capsule [id number] Yes Take 3 capsules (90 mg total) by mouth daily. Smitty Cords, DO Taking Active Care Giver, Pharmacy Records  finasteride (PROSCAR) 5 MG tablet [id number] Yes Take 1 tablet (5 mg total) by mouth daily. Smitty Cords, DO Taking Active Care Giver, Pharmacy Records  gabapentin (NEURONTIN) 300 MG capsule [id number] Yes Take 1 capsule (300 mg total) by mouth 2 (two) times daily. Smitty Cords, DO Taking  Active Care Giver, Pharmacy Records  Iron, Ferrous Sulfate, 325 (65 Fe) MG TABS [id number] Yes Take 325 mg by mouth daily with supper. Smitty Cords, DO Taking Active Care Giver, Pharmacy Records  latanoprost (XALATAN) 0.005 %  ophthalmic solution [id number] Yes Place 1 drop into both eyes at bedtime. [provider] Taking Active Care Giver, Pharmacy Records  Melatonin 5 MG CAPS [id number] Yes Take 1 capsule (5 mg total) by mouth at bedtime.  Patient taking differently: Take 15 mg by mouth at bedtime.   Smitty Cords, DO Taking Active Care Giver, Pharmacy Records  meloxicam [facility]) 15 MG tablet [id number] Yes TAKE 1 TABLET(15 MG) BY MOUTH DAILY Althea Charon, Netta Neat, DO Taking Active   OLANZapine (ZYPREXA) 2.5 MG tablet [id number] Yes Take 1 tablet (2.5 mg total) by mouth 2 (two) times daily. Smitty Cords, DO Taking Active Care Giver, Pharmacy Records  olmesartan [facility]) 40 MG tablet [id number] Yes Take 1 tablet (40 mg total) by mouth daily. Smitty Cords, DO Taking Active   oxyCODONE (OXY IR/ROXICODONE) 5 MG immediate release tablet [id number] Yes Take 1 tablet (5 mg total) by mouth every 6 (six) hours as needed for severe pain. [name], MD Taking Active   pantoprazole (PROTONIX) 40 MG tablet [id number] Yes Take 1 tablet (40 mg total) by mouth daily before breakfast. Smitty Cords, DO Taking Active Care Giver, Pharmacy Records  polyethylene glycol (MIRALAX / GLYCOLAX) 17 g packet [id number] Yes Take 17 g by mouth 2 (two) times daily. Narda Bonds, MD Taking Active Care Giver, Pharmacy Records  rosuvastatin (CRESTOR) 5 MG tablet [id number] Yes Take 1 tablet (5 mg total) by mouth every evening. Charise Killian, MD Taking Active   tamsulosin [facility]) 0.4 MG CAPS capsule [id number] Yes Take 1 capsule (0.4 mg total) by mouth daily. Smitty Cords, DO Taking Active Care Giver, Pharmacy Records  traZODone [facility]) 150 MG tablet [id number] Yes Take 1 tablet (150 mg total) by mouth at bedtime as needed. Smitty Cords, DO Taking Active Care Giver, Pharmacy Records  Med List Note Concepcion Elk, [place] 12/27/22 1034): UDS 09-28-22 MR April 17, 2023 Med  agreement signed and explained to pat that he would need to bring old patches and the box with him to his refill appts. Patient with understanding. Gave copy so that caretaker could read to him. I reviewed it verbally. cfg            Home Care and Equipment/Supplies: Were Home Health Services Ordered?: No (Hospice was ordered and has seen pt,  CG states she thinks daughter may be canceling Hospice) Any new equipment or medical supplies ordered?: Yes Name of Medical supply agency?: CG states " Hospice brought oxygen" Were you able to get the equipment/medical supplies?: Yes Do you have any questions related to the use of the equipment/supplies?: No  Functional Questionnaire: Do you need assistance with bathing/showering or dressing?: Yes (CG assists) Do you need assistance with meal preparation?: Yes (CG assists) Do you need assistance with eating?: No Do you have difficulty maintaining continence: No Do you need assistance with getting out of bed/getting out of a chair/moving?: Yes (CG assists) Do you have difficulty managing or taking your medications?: Yes (CG provides oversight/ assistance)  Follow up appointments reviewed: PCP Follow-up appointment confirmed?: Yes Date of PCP follow-up appointment?: 04/02/23 Follow-up Provider: Dr. Chrystie Nose  @ 3 pm,   pt is on wait list to be called if sooner appointment is available Specialist Hospital Follow-up appointment  confirmed?: Yes Date of Specialist follow-up appointment?: 03/14/23 Follow-Up Specialty Provider:: Dr. Laban Emperor-  pain management Do you need transportation to your follow-up appointment?: No Do you understand care options if your condition(s) worsen?: Yes-patient verbalized understanding    Irving Shows [facility], BSN [place]/ Ambulatory Care Management [phone]

## 2023-03-05 ENCOUNTER — Emergency Department: Payer: Medicare Other

## 2023-03-05 ENCOUNTER — Inpatient Hospital Stay
Admission: EM | Admit: 2023-03-05 | Discharge: 2023-03-10 | DRG: 193 | Disposition: A | Discharge status: Hospice | Payer: Medicare Other | Attending: Internal Medicine | Admitting: Internal Medicine

## 2023-03-05 ENCOUNTER — Other Ambulatory Visit: Payer: Self-pay

## 2023-03-05 DIAGNOSIS — C439 Malignant melanoma of skin, unspecified: Secondary | ICD-10-CM | POA: Diagnosis present

## 2023-03-05 DIAGNOSIS — F1120 Opioid dependence, uncomplicated: Secondary | ICD-10-CM | POA: Diagnosis present

## 2023-03-05 DIAGNOSIS — Z6831 Body mass index (BMI) 31.0-31.9, adult: Secondary | ICD-10-CM

## 2023-03-05 DIAGNOSIS — Z888 Allergy status to other drugs, medicaments and biological substances status: Secondary | ICD-10-CM

## 2023-03-05 DIAGNOSIS — I5032 Chronic diastolic (congestive) heart failure: Secondary | ICD-10-CM | POA: Diagnosis present

## 2023-03-05 DIAGNOSIS — Z1152 Encounter for screening for COVID-19: Secondary | ICD-10-CM

## 2023-03-05 DIAGNOSIS — Z66 Do not resuscitate: Secondary | ICD-10-CM | POA: Diagnosis present

## 2023-03-05 DIAGNOSIS — F332 Major depressive disorder, recurrent severe without psychotic features: Secondary | ICD-10-CM | POA: Diagnosis present

## 2023-03-05 DIAGNOSIS — I5030 Unspecified diastolic (congestive) heart failure: Secondary | ICD-10-CM | POA: Diagnosis not present

## 2023-03-05 DIAGNOSIS — Z8249 Family history of ischemic heart disease and other diseases of the circulatory system: Secondary | ICD-10-CM

## 2023-03-05 DIAGNOSIS — E669 Obesity, unspecified: Secondary | ICD-10-CM | POA: Diagnosis present

## 2023-03-05 DIAGNOSIS — K219 Gastro-esophageal reflux disease without esophagitis: Secondary | ICD-10-CM | POA: Diagnosis present

## 2023-03-05 DIAGNOSIS — Z515 Encounter for palliative care: Secondary | ICD-10-CM

## 2023-03-05 DIAGNOSIS — J90 Pleural effusion, not elsewhere classified: Secondary | ICD-10-CM | POA: Diagnosis not present

## 2023-03-05 DIAGNOSIS — H903 Sensorineural hearing loss, bilateral: Secondary | ICD-10-CM | POA: Diagnosis present

## 2023-03-05 DIAGNOSIS — Z720 Tobacco use: Secondary | ICD-10-CM

## 2023-03-05 DIAGNOSIS — I110 Hypertensive heart disease with heart failure: Secondary | ICD-10-CM | POA: Diagnosis present

## 2023-03-05 DIAGNOSIS — C779 Secondary and unspecified malignant neoplasm of lymph node, unspecified: Secondary | ICD-10-CM | POA: Diagnosis present

## 2023-03-05 DIAGNOSIS — R627 Adult failure to thrive: Secondary | ICD-10-CM | POA: Diagnosis present

## 2023-03-05 DIAGNOSIS — Z818 Family history of other mental and behavioral disorders: Secondary | ICD-10-CM

## 2023-03-05 DIAGNOSIS — Z79899 Other long term (current) drug therapy: Secondary | ICD-10-CM

## 2023-03-05 DIAGNOSIS — J440 Chronic obstructive pulmonary disease with acute lower respiratory infection: Secondary | ICD-10-CM | POA: Diagnosis present

## 2023-03-05 DIAGNOSIS — J189 Pneumonia, unspecified organism: Secondary | ICD-10-CM | POA: Diagnosis present

## 2023-03-05 DIAGNOSIS — J441 Chronic obstructive pulmonary disease with (acute) exacerbation: Secondary | ICD-10-CM | POA: Diagnosis not present

## 2023-03-05 DIAGNOSIS — H409 Unspecified glaucoma: Secondary | ICD-10-CM | POA: Diagnosis present

## 2023-03-05 DIAGNOSIS — Z86006 Personal history of melanoma in-situ: Secondary | ICD-10-CM

## 2023-03-05 DIAGNOSIS — G894 Chronic pain syndrome: Secondary | ICD-10-CM | POA: Diagnosis present

## 2023-03-05 DIAGNOSIS — M456 Ankylosing spondylitis lumbar region: Secondary | ICD-10-CM | POA: Diagnosis present

## 2023-03-05 DIAGNOSIS — H3552 Pigmentary retinal dystrophy: Secondary | ICD-10-CM | POA: Diagnosis present

## 2023-03-05 DIAGNOSIS — Z791 Long term (current) use of non-steroidal anti-inflammatories (NSAID): Secondary | ICD-10-CM

## 2023-03-05 DIAGNOSIS — F1011 Alcohol abuse, in remission: Secondary | ICD-10-CM | POA: Diagnosis present

## 2023-03-05 DIAGNOSIS — R0602 Shortness of breath: Principal | ICD-10-CM

## 2023-03-05 DIAGNOSIS — Z602 Problems related to living alone: Secondary | ICD-10-CM | POA: Diagnosis present

## 2023-03-05 DIAGNOSIS — J9601 Acute respiratory failure with hypoxia: Secondary | ICD-10-CM | POA: Diagnosis present

## 2023-03-05 DIAGNOSIS — E785 Hyperlipidemia, unspecified: Secondary | ICD-10-CM | POA: Diagnosis present

## 2023-03-05 DIAGNOSIS — Z9181 History of falling: Secondary | ICD-10-CM

## 2023-03-05 DIAGNOSIS — J449 Chronic obstructive pulmonary disease, unspecified: Secondary | ICD-10-CM | POA: Diagnosis not present

## 2023-03-05 DIAGNOSIS — I7143 Infrarenal abdominal aortic aneurysm, without rupture: Secondary | ICD-10-CM | POA: Diagnosis present

## 2023-03-05 DIAGNOSIS — J910 Malignant pleural effusion: Secondary | ICD-10-CM | POA: Diagnosis present

## 2023-03-05 DIAGNOSIS — Z9889 Other specified postprocedural states: Secondary | ICD-10-CM

## 2023-03-05 DIAGNOSIS — F419 Anxiety disorder, unspecified: Secondary | ICD-10-CM | POA: Diagnosis present

## 2023-03-05 DIAGNOSIS — R296 Repeated falls: Secondary | ICD-10-CM | POA: Diagnosis present

## 2023-03-05 DIAGNOSIS — I472 Ventricular tachycardia, unspecified: Secondary | ICD-10-CM | POA: Diagnosis present

## 2023-03-05 DIAGNOSIS — M4814 Ankylosing hyperostosis [Forestier], thoracic region: Secondary | ICD-10-CM | POA: Diagnosis present

## 2023-03-05 DIAGNOSIS — M542 Cervicalgia: Secondary | ICD-10-CM | POA: Diagnosis present

## 2023-03-05 DIAGNOSIS — H548 Legal blindness, as defined in USA: Secondary | ICD-10-CM | POA: Diagnosis present

## 2023-03-05 DIAGNOSIS — W19XXXD Unspecified fall, subsequent encounter: Secondary | ICD-10-CM | POA: Diagnosis present

## 2023-03-05 DIAGNOSIS — Z85820 Personal history of malignant melanoma of skin: Secondary | ICD-10-CM

## 2023-03-05 DIAGNOSIS — C792 Secondary malignant neoplasm of skin: Secondary | ICD-10-CM | POA: Diagnosis present

## 2023-03-05 DIAGNOSIS — S12100D Unspecified displaced fracture of second cervical vertebra, subsequent encounter for fracture with routine healing: Secondary | ICD-10-CM

## 2023-03-05 DIAGNOSIS — Z9079 Acquired absence of other genital organ(s): Secondary | ICD-10-CM

## 2023-03-05 DIAGNOSIS — Z881 Allergy status to other antibiotic agents status: Secondary | ICD-10-CM

## 2023-03-05 DIAGNOSIS — Z8781 Personal history of (healed) traumatic fracture: Secondary | ICD-10-CM

## 2023-03-05 DIAGNOSIS — M1990 Unspecified osteoarthritis, unspecified site: Secondary | ICD-10-CM | POA: Diagnosis present

## 2023-03-05 DIAGNOSIS — Z9049 Acquired absence of other specified parts of digestive tract: Secondary | ICD-10-CM

## 2023-03-05 DIAGNOSIS — Z9151 Personal history of suicidal behavior: Secondary | ICD-10-CM

## 2023-03-05 LAB — CBC WITH DIFFERENTIAL/PLATELET
Abs Immature Granulocytes: 0.15 10*3/uL — ABNORMAL HIGH (ref 0.00–0.07)
Basophils Absolute: 0 10*3/uL (ref 0.0–0.1)
Basophils Relative: 0 %
Eosinophils Absolute: 0.2 10*3/uL (ref 0.0–0.5)
Eosinophils Relative: 4 %
HCT: 33.3 % — ABNORMAL LOW (ref 39.0–52.0)
Hemoglobin: 10.1 g/dL — ABNORMAL LOW (ref 13.0–17.0)
Immature Granulocytes: 3 %
Lymphocytes Relative: 17 %
Lymphs Abs: 0.8 10*3/uL (ref 0.7–4.0)
MCH: 29 pg (ref 26.0–34.0)
MCHC: 30.3 g/dL (ref 30.0–36.0)
MCV: 95.7 fL (ref 80.0–100.0)
Monocytes Absolute: 1.1 10*3/uL — ABNORMAL HIGH (ref 0.1–1.0)
Monocytes Relative: 23 %
Neutro Abs: 2.5 10*3/uL (ref 1.7–7.7)
Neutrophils Relative %: 53 %
Platelets: 253 10*3/uL (ref 150–400)
RBC: 3.48 MIL/uL — ABNORMAL LOW (ref 4.22–5.81)
RDW: 15.9 % — ABNORMAL HIGH (ref 11.5–15.5)
WBC: 4.8 10*3/uL (ref 4.0–10.5)
nRBC: 0 % (ref 0.0–0.2)

## 2023-03-05 LAB — COMPREHENSIVE METABOLIC PANEL
ALT: 8 U/L (ref 0–44)
AST: 25 U/L (ref 15–41)
Albumin: 3 g/dL — ABNORMAL LOW (ref 3.5–5.0)
Alkaline Phosphatase: 58 U/L (ref 38–126)
Anion gap: 8 (ref 5–15)
BUN: 17 mg/dL (ref 8–23)
CO2: 23 mmol/L (ref 22–32)
Calcium: 8.5 mg/dL — ABNORMAL LOW (ref 8.9–10.3)
Chloride: 106 mmol/L (ref 98–111)
Creatinine, Ser: 1.31 mg/dL — ABNORMAL HIGH (ref 0.61–1.24)
GFR, Estimated: 53 mL/min — ABNORMAL LOW (ref >60)
Glucose, Bld: 112 mg/dL — ABNORMAL HIGH (ref 70–99)
Potassium: 5.1 mmol/L (ref 3.5–5.1)
Sodium: 137 mmol/L (ref 135–145)
Total Bilirubin: 0.7 mg/dL (ref 0.3–1.2)
Total Protein: 6.4 g/dL — ABNORMAL LOW (ref 6.5–8.1)

## 2023-03-05 LAB — SARS CORONAVIRUS 2 BY RT PCR: SARS Coronavirus 2 by RT PCR: NEGATIVE

## 2023-03-05 LAB — TROPONIN I (HIGH SENSITIVITY)
Troponin I (High Sensitivity): 11 ng/L (ref <18)
Troponin I (High Sensitivity): 12 ng/L (ref <18)

## 2023-03-05 LAB — PROCALCITONIN: Procalcitonin: <0.1 ng/mL

## 2023-03-05 LAB — BRAIN NATRIURETIC PEPTIDE: B Natriuretic Peptide: 27.5 pg/mL (ref 0.0–100.0)

## 2023-03-05 MED ORDER — ROSUVASTATIN CALCIUM 10 MG PO TABS
5.0000 mg | ORAL_TABLET | Freq: Every evening | ORAL | Status: DC
  Administered 2023-03-06 – 2023-03-09 (×3): 5 mg via ORAL
  Filled 2023-03-05 (×3): qty 1

## 2023-03-05 MED ORDER — GABAPENTIN 300 MG PO CAPS
300.0000 mg | ORAL_CAPSULE | Freq: Two times a day (BID) | ORAL | Status: DC
  Administered 2023-03-05 – 2023-03-10 (×9): 300 mg via ORAL
  Filled 2023-03-05 (×9): qty 1

## 2023-03-05 MED ORDER — AMIODARONE HCL 200 MG PO TABS
400.0000 mg | ORAL_TABLET | Freq: Two times a day (BID) | ORAL | Status: DC
  Administered 2023-03-05: 400 mg via ORAL
  Filled 2023-03-05: qty 2

## 2023-03-05 MED ORDER — ONDANSETRON HCL 4 MG PO TABS: 4.0000 mg | ORAL_TABLET | Freq: Four times a day (QID) | ORAL | Status: DC | PRN

## 2023-03-05 MED ORDER — PANTOPRAZOLE SODIUM 40 MG IV SOLR
40.0000 mg | Freq: Two times a day (BID) | INTRAVENOUS | Status: DC
  Administered 2023-03-05 – 2023-03-10 (×9): 40 mg via INTRAVENOUS
  Filled 2023-03-05 (×9): qty 10

## 2023-03-05 MED ORDER — HYDROCODONE-ACETAMINOPHEN 5-325 MG PO TABS
1.0000 | ORAL_TABLET | Freq: Four times a day (QID) | ORAL | Status: DC | PRN
  Administered 2023-03-05 – 2023-03-10 (×12): 1 via ORAL
  Filled 2023-03-05 (×12): qty 1

## 2023-03-05 MED ORDER — SODIUM CHLORIDE 0.9 % IV SOLN
2.0000 g | INTRAVENOUS | Status: DC
  Administered 2023-03-06 – 2023-03-09 (×4): 2 g via INTRAVENOUS
  Filled 2023-03-05 (×5): qty 20

## 2023-03-05 MED ORDER — SODIUM CHLORIDE 0.9 % IV SOLN
100.0000 mg | Freq: Once | INTRAVENOUS | Status: AC
  Administered 2023-03-05: 100 mg via INTRAVENOUS
  Filled 2023-03-05: qty 100

## 2023-03-05 MED ORDER — PREDNISONE 20 MG PO TABS
40.0000 mg | ORAL_TABLET | Freq: Every day | ORAL | Status: AC
  Administered 2023-03-05 – 2023-03-09 (×5): 40 mg via ORAL
  Filled 2023-03-05 (×5): qty 2

## 2023-03-05 MED ORDER — TAMSULOSIN HCL 0.4 MG PO CAPS
0.4000 mg | ORAL_CAPSULE | Freq: Every day | ORAL | Status: DC
  Administered 2023-03-07 – 2023-03-10 (×4): 0.4 mg via ORAL
  Filled 2023-03-05 (×4): qty 1

## 2023-03-05 MED ORDER — LEVOFLOXACIN IN D5W 750 MG/150ML IV SOLN: 750.0000 mg | INTRAVENOUS | Status: DC

## 2023-03-05 MED ORDER — ONDANSETRON HCL 4 MG/2ML IJ SOLN
4.0000 mg | Freq: Four times a day (QID) | INTRAMUSCULAR | Status: DC | PRN
  Administered 2023-03-10: 4 mg via INTRAVENOUS
  Filled 2023-03-05: qty 2

## 2023-03-05 MED ORDER — FUROSEMIDE 10 MG/ML IJ SOLN
40.0000 mg | Freq: Once | INTRAMUSCULAR | Status: AC
  Administered 2023-03-05: 40 mg via INTRAVENOUS
  Filled 2023-03-05: qty 4

## 2023-03-05 MED ORDER — ACETAMINOPHEN 325 MG PO TABS
650.0000 mg | ORAL_TABLET | Freq: Four times a day (QID) | ORAL | Status: DC | PRN
  Administered 2023-03-06 – 2023-03-07 (×2): 650 mg via ORAL
  Filled 2023-03-05 (×3): qty 2

## 2023-03-05 MED ORDER — PREDNISONE 20 MG PO TABS: 40.0000 mg | ORAL_TABLET | Freq: Every day | ORAL | Status: DC

## 2023-03-05 MED ORDER — SODIUM CHLORIDE 0.9 % IV SOLN
1.0000 g | Freq: Once | INTRAVENOUS | Status: AC
  Administered 2023-03-05: 1 g via INTRAVENOUS
  Filled 2023-03-05: qty 10

## 2023-03-05 MED ORDER — DORZOLAMIDE HCL-TIMOLOL MAL 2-0.5 % OP SOLN
1.0000 [drp] | Freq: Two times a day (BID) | OPHTHALMIC | Status: DC
  Administered 2023-03-05 – 2023-03-10 (×9): 1 [drp] via OPHTHALMIC
  Filled 2023-03-05: qty 10

## 2023-03-05 MED ORDER — SODIUM CHLORIDE 0.9 % IV SOLN
100.0000 mg | Freq: Two times a day (BID) | INTRAVENOUS | Status: DC
  Administered 2023-03-06 – 2023-03-10 (×9): 100 mg via INTRAVENOUS
  Filled 2023-03-05 (×10): qty 100

## 2023-03-05 MED ORDER — ENOXAPARIN SODIUM 60 MG/0.6ML IJ SOSY
50.0000 mg | PREFILLED_SYRINGE | INTRAMUSCULAR | Status: DC
  Administered 2023-03-05: 50 mg via SUBCUTANEOUS
  Filled 2023-03-05: qty 0.6

## 2023-03-05 MED ORDER — OLANZAPINE 2.5 MG PO TABS
2.5000 mg | ORAL_TABLET | Freq: Two times a day (BID) | ORAL | Status: DC
  Administered 2023-03-05 – 2023-03-10 (×9): 2.5 mg via ORAL
  Filled 2023-03-05 (×10): qty 1

## 2023-03-05 MED ORDER — FINASTERIDE 5 MG PO TABS
5.0000 mg | ORAL_TABLET | Freq: Every day | ORAL | Status: DC
  Administered 2023-03-07 – 2023-03-10 (×4): 5 mg via ORAL
  Filled 2023-03-05 (×5): qty 1

## 2023-03-05 MED ORDER — IOHEXOL 350 MG/ML SOLN
80.0000 mL | Freq: Once | INTRAVENOUS | Status: AC | PRN
  Administered 2023-03-05: 80 mL via INTRAVENOUS

## 2023-03-05 MED ORDER — FUROSEMIDE 20 MG PO TABS
40.0000 mg | ORAL_TABLET | Freq: Every day | ORAL | Status: DC
  Administered 2023-03-05: 40 mg via ORAL
  Filled 2023-03-05: qty 1

## 2023-03-05 NOTE — Assessment & Plan Note (Signed)
PPI  ? ?

## 2023-03-05 NOTE — Assessment & Plan Note (Signed)
2D echo September 2024 with EF 55 to 60% Appears fairly euvolemic Monitor volume status Reassess if any potential pleural effusion on CT scan Monitor

## 2023-03-05 NOTE — ED Notes (Signed)
Caregiver at bedside

## 2023-03-05 NOTE — ED Notes (Signed)
Patient requesting 2% milk at this time. This RN assisted patient to drink one carton of milk. Dining called to follow-up about patient's dinner tray. Dining says tray will arrive in about 30 minutes.

## 2023-03-05 NOTE — Assessment & Plan Note (Signed)
preoperative clearance for endovascular repair as per vasc surg. Last aneurysm size was 5 cm

## 2023-03-05 NOTE — ED Provider Notes (Addendum)
[facility] Provider Note    Event Date/Time   First MD Initiated Contact with Patient 03/05/23 1203     (approximate)   History   Chief Complaint Shortness of Breath   HPI  Aaron Gibbs is a 88 y.o. male with past medical history of hypertension, COPD, diastolic CHF, blindness, and chronic pain syndrome who presents to the ED complaining of shortness of breath.  Patient reports that he has been dealing with a dry cough for the past couple of days, felt increasingly short of breath this morning.  He denies any associated pain in his chest and has not had any fevers.  He reports some swelling in his left arm around his recent surgical site where he had metastatic melanoma removed about 2 weeks ago.  He has not noticed any pain or swelling in his legs, states his caregiver has been changing the dressing around his left arm.  Per EMS, patient noted to have room air oxygen saturations of 90 to 91%, placed on 4 L nasal cannula with improvement.     Physical Exam   Triage Vital Signs: ED Triage Vitals  Encounter Vitals Group     BP 03/05/23 1211 (!) 150/74     Systolic BP Percentile --      Diastolic BP Percentile --      Pulse Rate 03/05/23 1211 64     Resp 03/05/23 1211 18     Temp 03/05/23 1211 99.2 F (37.3 C)     Temp Source 03/05/23 1211 Oral     SpO2 03/05/23 1211 95 %     Weight 03/05/23 1212 229 lb 4.5 oz (104 kg)     Height 03/05/23 1212 5\' 11"  (1.803 m)     Head Circumference --      Peak Flow --      Pain Score 03/05/23 1212 7     Pain Loc --      Pain Education --      Exclude from Growth Chart --     Most recent vital signs: Vitals:   03/05/23 1211  BP: (!) 150/74  Pulse: 64  Resp: 18  Temp: 99.2 F (37.3 C)  SpO2: 95%    Constitutional: Alert and oriented. Eyes: Conjunctivae are normal. Head: Atraumatic. Nose: No congestion/rhinnorhea. Mouth/Throat: Mucous membranes are moist.  Cardiovascular: Normal rate, regular  rhythm. Grossly normal heart sounds.  2+ radial pulses bilaterally. Respiratory: Normal respiratory effort.  No retractions. Lungs CTAB. Gastrointestinal: Soft and nontender. No distention. Musculoskeletal: No lower extremity tenderness nor edema.  Edema noted to left forearm with surgical site showing no erythema, warmth, or drainage.  Green mesh sutured in place and intact. Neurologic:  Normal speech and language. No gross focal neurologic deficits are appreciated.    ED Results / Procedures / Treatments   Labs (all labs ordered are listed, but only abnormal results are displayed) Labs Reviewed  CBC WITH DIFFERENTIAL/PLATELET - Abnormal; Notable for the following components:      Result Value   RBC 3.48 (*)    Hemoglobin 10.1 (*)    HCT 33.3 (*)    RDW 15.9 (*)    Monocytes Absolute 1.1 (*)    Abs Immature Granulocytes 0.15 (*)    All other components within normal limits  COMPREHENSIVE METABOLIC PANEL - Abnormal; Notable for the following components:   Glucose, Bld 112 (*)    Creatinine, Ser 1.31 (*)    Calcium 8.5 (*)    Total Protein  6.4 (*)    Albumin 3.0 (*)    GFR, Estimated 53 (*)    All other components within normal limits  SARS CORONAVIRUS 2 BY RT PCR  PROCALCITONIN  BRAIN NATRIURETIC PEPTIDE  TROPONIN I (HIGH SENSITIVITY)  TROPONIN I (HIGH SENSITIVITY)     EKG  ED ECG REPORT I, Chesley Noon, the attending physician, personally viewed and interpreted this ECG.   Date: 03/05/2023  EKG Time: 12:11  Rate: 66  Rhythm: normal sinus rhythm  Axis: Normal  Intervals:none  ST&T Change: None  RADIOLOGY Chest x-ray reviewed and interpreted by me with large right-sided pleural effusion.  PROCEDURES:  Critical Care performed: Yes, see critical care procedure note(s)  .Critical Care  Performed by: Chesley Noon, MD Authorized by: Chesley Noon, MD   Critical care provider statement:    Critical care time (minutes):  30   Critical care time was  exclusive of:  Separately billable procedures and treating other patients and teaching time   Critical care was necessary to treat or prevent imminent or life-threatening deterioration of the following conditions:  Respiratory failure   Critical care was time spent personally by me on the following activities:  Development of treatment plan with patient or surrogate, discussions with consultants, evaluation of patient's response to treatment, examination of patient, ordering and review of laboratory studies, ordering and review of radiographic studies, ordering and performing treatments and interventions, pulse oximetry, re-evaluation of patient's condition and review of old charts   I assumed direction of critical care for this patient from another provider in my specialty: no     Care discussed with: admitting provider      MEDICATIONS ORDERED IN ED: Medications  cefTRIAXone (ROCEPHIN) 1 g in sodium chloride 0.9 % 100 mL IVPB (1 g Intravenous New Bag/Given 03/05/23 1413)  doxycycline (VIBRAMYCIN) 100 mg in sodium chloride 0.9 % 250 mL IVPB (has no administration in time range)     IMPRESSION / MDM / ASSESSMENT AND PLAN / ED COURSE  I reviewed the triage vital signs and the nursing notes.                              89 y.o. male with past medical history of hypertension, COPD, diastolic CHF, blindness, and chronic pain syndrome who presents to the ED complaining of dry cough with increasing difficulty breathing today.  Patient's presentation is most consistent with acute presentation with potential threat to life or bodily function.  Differential diagnosis includes, but is not limited to, ACS, PE, pneumonia, pneumothorax, pleural effusion, CHF exacerbation, COPD exacerbation, viral illness.  Patient chronically ill but nontoxic-appearing and in no acute distress, vital signs are unremarkable.  He is not in any respiratory distress, currently maintaining oxygen saturations on 4 L nasal  cannula, will attempt to wean off of supplemental oxygen.  EKG shows no evidence of arrhythmia or ischemia, labs and chest x-ray are pending at this time.  Surgical site to his left forearm appears to be healing well with no signs of infection, new dressing was applied.  Labs without significant anemia or leukocytosis, he does have a mild AKI but no acute electrolyte abnormality and LFTs are unremarkable.  Troponin within normal limits and I doubt ACS or PE, but chest x-ray does show complete opacification of the right lung.  I am concerned for pneumonia, will add on procalcitonin and treat with IV Rocephin and doxycycline given his azithromycin allergy.  Patient currently  maintaining oxygen saturations on 2 L nasal cannula, case discussed with hospitalist for admission.  We will check CT of chest at request of hospitalist.      FINAL CLINICAL IMPRESSION(S) / ED DIAGNOSES   Final diagnoses:  Shortness of breath  Pleural effusion     Rx / DC Orders   ED Discharge Orders     None        Note:  This document was prepared using Dragon voice recognition software and may include unintentional dictation errors.   Chesley Noon, MD 03/05/23 1422    Chesley Noon, MD 03/05/23 [phone]

## 2023-03-05 NOTE — Assessment & Plan Note (Signed)
Noted large right pleural effusion on chest x-ray in setting of acute respiratory failure with hypoxia now requiring 4 L Will formally consult IR for thoracentesis Patient does report prior history of this in the past Monitor

## 2023-03-05 NOTE — H&P (Signed)
History and Physical    Patient: Aaron Gibbs KZS:[id number] DOB: Jan 06, 1935 DOA: 03/05/2023 DOS: the patient was seen and examined on 03/05/2023 PCP: Smitty Cords, DO  Patient coming from: Home  Chief Complaint:  Chief Complaint  Patient presents with   Shortness of Breath   HPI: Aaron Gibbs is a 88 y.o. 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  Patient reports increased work of breathing shortness of breath over the past 2 to 3 days.  No fevers or chills.  Mild orthopnea and PND.  No abdominal pain.  No diarrhea.  No reported chest pain.  Noted to have been recently admitted August 31 through September 5.  Has had otherwise stable discharge apart from shortness of breath. Presented to the ER Tmax nine 9.2, heart rate 60s, respirations stable, blood pressure 100s to 150s over 70s.  Satting in the mid 90s on 4 L.  White count 4.8, hemoglobin 10.1, platelets 253, creatinine 1.31.  Chest x-ray with interval development of large right pleural effusion.  No patchy opacities in the left midlung and lower lung suspicious for pneumonia.  Review of Systems: As mentioned in the history of present illness. All other systems reviewed and are negative. Past Medical History:  Diagnosis Date   (HFpEF) heart failure with preserved ejection fraction (HCC)    AAA (abdominal aortic aneurysm) without rupture (HCC)    5cm/Followed by Festus Barren MD/Vascular/[facility]   Ankylosing spondylitis lumbar region [facility])    Anxiety    C2 cervical fracture (HCC) 09/2022   Traumatic Fall. Medically manage/no surgical intervention/Soft Collar   Chronic pain after traumatic injury 09/2022   Traumatic Fall= C2 Fracture. Butrans Patch   COPD (chronic obstructive pulmonary disease) (HCC)    DISH (diffuse  idiopathic skeletal hyperostosis)    GERD (gastroesophageal reflux disease)    Glaucoma    History of alcohol abuse    History of tobacco abuse    Hypertension    In-transit metastasis from malignant melanoma of skin (HCC)    Left Forearm   Major depressive disorder, recurrent episode, severe (HCC)    Melanoma in situ of left upper arm (HCC)    s/p excision 02/15/23   Multiple falls    Opioid dependence (HCC)    Osteoarthritis    Retinitis pigmentosa of both eyes    Legally Blind   Sensorineural hearing loss (SNHL) of both ears    Suicide attempt (HCC) (05/13/18) 06/26/2018   Past Surgical History:  Procedure Laterality Date   CHOLECYSTECTOMY     COLONOSCOPY     EXCISION MELANOMA WITH SENTINEL LYMPH NODE BIOPSY Left 01/11/2023   Procedure: CAUTERIZATION OF BLEEDING WOUND ON LEFT FOREARM;  Surgeon: [name], MD;  Location: [facility]S;  Service: General;  Laterality: Left;   EXCISION MELANOMA WITH SENTINEL LYMPH NODE BIOPSY N/A 02/15/2023   Procedure: WIDE LOCAL EXCISION LEFT FOREARM MELANOMA SITES WITH SKIN SUBSTITUTE COVERAGE, SENTINEL LYMPH NODE BIOPSY;  Surgeon: [name], MD;  Location: [facility] OR;  Service: General;  Laterality: N/A;   TRANSURETHRAL RESECTION OF PROSTATE N/A 2008   Social History:  reports that he has quit smoking. His smoking use included cigarettes. He has quit using smokeless tobacco.  His smokeless tobacco use included chew. He reports that he does not currently use alcohol. He reports that he does not use drugs.  Allergies  Allergen  Reactions   Anthralin Shortness Of Breath   Azithromycin Shortness Of Breath    Family History  Problem Relation Age of Onset   Depression Mother    Heart disease Father     Prior to Admission medications   Medication Sig Start Date End Date Taking? Authorizing Provider  acetaminophen (TYLENOL) 500 MG tablet Take 1,000 mg by mouth in the morning, at noon, and at bedtime.   Yes [provider]  albuterol  (VENTOLIN HFA) 108 (90 Base) MCG/ACT inhaler Inhale 2 puffs into the lungs every 4 (four) hours as needed for wheezing or shortness of breath.   Yes [provider]  amiodarone (PACERONE) 400 MG tablet Take 1 tablet (400 mg total) by mouth 2 (two) times daily for 14 days. 02/22/23 03/08/23 Yes Charise Killian, MD  buprenorphine (BUTRANS) 10 MCG/HR PTWK Place 1 patch onto the skin once a week for 28 days. Apply only 1 patch at a time and alternate sites weekly. 12/27/22 02/13/24 Yes Delano Metz, MD  carvedilol (COREG) 12.5 MG tablet Take 1 tablet (12.5 mg total) by mouth 2 (two) times daily. 12/27/22  Yes Karamalegos, Netta Neat, DO  COSOPT 22.3-6.8 MG/ML ophthalmic solution Place 1 drop into both eyes 2 (two) times daily. 06/25/20  Yes [provider]  diclofenac Sodium (VOLTAREN) 1 % GEL Apply 2 g topically at bedtime. Apply to both knees twice daily Patient taking differently: Apply 2 g topically at bedtime. Apply to both knees at night 02/10/21 10/17/23 Yes Karamalegos, Netta Neat, DO  doxycycline (VIBRA-TABS) 100 MG tablet Take 1 tablet (100 mg total) by mouth 2 (two) times daily. 02/15/23  Yes [name], MD  DULoxetine (CYMBALTA) 30 MG capsule Take 3 capsules (90 mg total) by mouth daily. 12/27/22  Yes Karamalegos, Netta Neat, DO  finasteride (PROSCAR) 5 MG tablet Take 1 tablet (5 mg total) by mouth daily. 12/27/22  Yes Karamalegos, Netta Neat, DO  gabapentin (NEURONTIN) 300 MG capsule Take 1 capsule (300 mg total) by mouth 2 (two) times daily. 12/27/22  Yes Karamalegos, Netta Neat, DO  Iron, Ferrous Sulfate, 325 (65 Fe) MG TABS Take 325 mg by mouth daily with supper. 06/06/22  Yes Karamalegos, Netta Neat, DO  latanoprost (XALATAN) 0.005 % ophthalmic solution Place 1 drop into both eyes at bedtime. 02/28/22  Yes [provider]  Melatonin 5 MG CAPS Take 1 capsule (5 mg total) by mouth at bedtime. Patient taking differently: Take 15 mg by mouth at bedtime. 02/03/22   Yes Karamalegos, Netta Neat, DO  meloxicam (MOBIC) 15 MG tablet TAKE 1 TABLET(15 MG) BY MOUTH DAILY 02/20/23  Yes Karamalegos, Alexander J, DO  OLANZapine (ZYPREXA) 2.5 MG tablet Take 1 tablet (2.5 mg total) by mouth 2 (two) times daily. 12/27/22  Yes Karamalegos, Netta Neat, DO  olmesartan (BENICAR) 40 MG tablet Take 1 tablet (40 mg total) by mouth daily. 01/30/23  Yes Karamalegos, Netta Neat, DO  pantoprazole (PROTONIX) 40 MG tablet Take 1 tablet (40 mg total) by mouth daily before breakfast. 12/27/22  Yes Karamalegos, Netta Neat, DO  polyethylene glycol (MIRALAX / GLYCOLAX) 17 g packet Take 17 g by mouth 2 (two) times daily. 10/30/22  Yes Narda Bonds, MD  rosuvastatin (CRESTOR) 5 MG tablet Take 1 tablet (5 mg total) by mouth every evening. 02/22/23 03/24/23 Yes Charise Killian, MD  tamsulosin (FLOMAX) 0.4 MG CAPS capsule Take 1 capsule (0.4 mg total) by mouth daily. 12/27/22  Yes Karamalegos, Netta Neat, DO  traZODone (DESYREL) 150  MG tablet Take 1 tablet (150 mg total) by mouth at bedtime as needed. 12/27/22  Yes Karamalegos, Netta Neat, DO  amiodarone (PACERONE) 200 MG tablet Take 1 tablet (200 mg total) by mouth 2 (two) times daily. Only start this script after completing amiodarone 400 mg BID x 14 days. 02/22/23 03/24/23  Charise Killian, MD  oxyCODONE (OXY IR/ROXICODONE) 5 MG immediate release tablet Take 1 tablet (5 mg total) by mouth every 6 (six) hours as needed for severe pain. Patient not taking: Reported on 03/05/2023 02/15/23   [name], MD    Physical Exam: Vitals:   03/05/23 1211 03/05/23 1212 03/05/23 1430  BP: (!) 150/74  104/79  Pulse: 64  65  Resp: 18  (!) 21  Temp: 99.2 F (37.3 C)    TempSrc: Oral    SpO2: 95%  98%  Weight:  104 kg   Height:  5\' 11"  (1.803 m)    Physical Exam Constitutional:      Appearance: He is obese.  HENT:     Head: Normocephalic and atraumatic.     Nose: Nose normal.     Mouth/Throat:     Mouth: Mucous membranes are moist.  Eyes:      Comments: Positive blindness  Cardiovascular:     Rate and Rhythm: Normal rate and regular rhythm.  Pulmonary:     Effort: Pulmonary effort is normal.     Comments: Decreased breath sounds in the right lung Abdominal:     General: Bowel sounds are normal.  Musculoskeletal:        General: Normal range of motion.  Skin:    General: Skin is warm.  Neurological:     General: No focal deficit present.  Psychiatric:        Mood and Affect: Mood normal.     Data Reviewed:  There are no new results to review at this time. DG Chest Portable 1 View CLINICAL DATA:  Shortness of breath  EXAM: PORTABLE CHEST - 1 VIEW  COMPARISON:  02/17/2023  FINDINGS: Cardiomediastinal silhouette and pulmonary vasculature are within normal limits.  Interval development of large right pleural effusion. Additional patchy opacities also present in the left mid to lower lung.  IMPRESSION: 1. Interval development of large right pleural effusion. 2. New patchy opacities in the left mid to lower lung suspicious for pneumonia.  Electronically Signed   By: Acquanetta Belling M.D.   On: 03/05/2023 14:30  Lab Results  Component Value Date   WBC 4.8 03/05/2023   HGB 10.1 (L) 03/05/2023   HCT 33.3 (L) 03/05/2023   MCV 95.7 03/05/2023   PLT 253 03/05/2023   Last metabolic panel Lab Results  Component Value Date   GLUCOSE 112 (H) 03/05/2023   NA 137 03/05/2023   K 5.1 03/05/2023   CL 106 03/05/2023   CO2 23 03/05/2023   BUN 17 03/05/2023   CREATININE 1.31 (H) 03/05/2023   GFRNONAA 53 (L) 03/05/2023   CALCIUM 8.5 (L) 03/05/2023   PHOS 3.4 03/30/2019   PROT 6.4 (L) 03/05/2023   ALBUMIN 3.0 (L) 03/05/2023   LABGLOB 2.7 06/09/2022   AGRATIO 2.1 07/10/2018   BILITOT 0.7 03/05/2023   ALKPHOS 58 03/05/2023   AST 25 03/05/2023   ALT 8 03/05/2023   ANIONGAP 8 03/05/2023     Assessment and Plan: Acute respiratory failure with hypoxia (HCC) Decompensated respiratory status now requiring 4 L  nasal cannula with noted large R pleural effusion and L sided PNA  Afebrile,  white count 5 Will place on empiric Rocephin and Doxy for infectious coverage in setting of azithromycin allergy. Pending CT of the chest to better assess Panculture Respiratory culture Urine strep Legionella Follow  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 [facility]) s/p resection 02/15/2023. Continue on doxycycline. Management per onco   Chronic heart failure with preserved ejection fraction (HCC) 2D echo September 2024 with EF 55 to 60% Appears fairly euvolemic Monitor volume status Reassess if any potential pleural effusion on CT scan Monitor  COPD (chronic obstructive pulmonary disease) (HCC) Positive trace cough and wheezing in the setting of acute respiratory failure with hypoxia w/ large R pleural effusion and PNA on CXR  Will place on steroids as well as DuoNebs  Monitor  Pleural effusion Noted large right pleural effusion on chest x-ray in setting of acute respiratory failure with hypoxia now requiring 4 L Will formally consult IR for thoracentesis Patient does report prior history of this in the past Monitor  Infrarenal abdominal aortic aneurysm (AAA) without rupture (HCC) preoperative clearance for endovascular repair as per vasc surg. Last aneurysm size was 5 cm   HLD (hyperlipidemia) Continue statin  GERD (gastroesophageal reflux disease) PPI      Advance Care Planning:   Code Status: Limited: Do not attempt resuscitation (DNR) -DNR-LIMITED -Do Not Intubate/DNI    Consults: None   Family Communication: No family at the bedside   Severity of Illness: The appropriate patient status for this patient is INPATIENT. Inpatient status is judged to be reasonable and necessary in order to provide the required intensity of service to ensure the patient's safety. The patient's presenting symptoms, physical exam findings, and initial radiographic  and laboratory data in the context of their chronic comorbidities is felt to place them at high risk for further clinical deterioration. Furthermore, it is not anticipated that the patient will be medically stable for discharge from the hospital within 2 midnights of admission.   * I certify that at the point of admission it is my clinical judgment that the patient will require inpatient hospital care spanning beyond 2 midnights from the point of admission due to high intensity of service, high risk for further deterioration and high frequency of surveillance required.*  Author: Floydene Flock, MD 03/05/2023 3:29 PM  For on call review [email or web address].

## 2023-03-05 NOTE — Assessment & Plan Note (Addendum)
Decompensated respiratory status now requiring 4 L nasal cannula with noted large R pleural effusion and L sided PNA  Afebrile, white count 5 Will place on empiric Rocephin and Doxy for infectious coverage in setting of azithromycin allergy. Pending CT of the chest to better assess Panculture Respiratory culture Urine strep Legionella Follow

## 2023-03-05 NOTE — Assessment & Plan Note (Addendum)
Positive trace cough and wheezing in the setting of acute respiratory failure with hypoxia w/ large R pleural effusion and PNA on CXR  Will place on steroids as well as DuoNebs  Monitor

## 2023-03-05 NOTE — ED Notes (Signed)
Pt was assisted to the side of his bed to urinate. Pt was informed of the importamnce of using call bell for assistance. Pt verbalized understabnding.

## 2023-03-05 NOTE — ED Notes (Signed)
Patient's IV in R arm noted to be infiltrated. Pharmacy called due to doxycycline running in this IV. Pharmacy advised nursing staff to remove patient's IV, apply a warm compress, and monitor for any swelling.

## 2023-03-05 NOTE — ED Triage Notes (Signed)
Pt to ed via [facility] from home with complaints of SOB since 7am today. Per EMS, upon arrival SPO2 on 92% RA put on 4L 98%. Pt had procedure to remove cancer on arm about 1 month ago. Per EMS he is coming from home where a caretaker cares for him. Per pt, caretaker has been doing dressing changes on arm. Pt is blind per EMS.   EMS vitals ETCO2 45  HR 67 130 systolic

## 2023-03-05 NOTE — Assessment & Plan Note (Signed)
Continue statin. 

## 2023-03-05 NOTE — ED Notes (Signed)
Pt assisted to stand to use urinal with this RN and caregiver. Pt back in bed and repositioned. Pt waiting on his dinner tray. Denies other needs at this time, call light within reach

## 2023-03-05 NOTE — Assessment & Plan Note (Signed)
s/p resection 02/15/2023. Continue on doxycycline. Management per onco

## 2023-03-05 NOTE — ED Notes (Signed)
Lab called to collect patient's blood.

## 2023-03-06 ENCOUNTER — Inpatient Hospital Stay: Payer: Medicare Other

## 2023-03-06 DIAGNOSIS — R0602 Shortness of breath: Secondary | ICD-10-CM | POA: Diagnosis not present

## 2023-03-06 DIAGNOSIS — I5032 Chronic diastolic (congestive) heart failure: Secondary | ICD-10-CM | POA: Diagnosis not present

## 2023-03-06 DIAGNOSIS — J90 Pleural effusion, not elsewhere classified: Secondary | ICD-10-CM | POA: Diagnosis not present

## 2023-03-06 DIAGNOSIS — Z9889 Other specified postprocedural states: Secondary | ICD-10-CM | POA: Diagnosis not present

## 2023-03-06 DIAGNOSIS — J9601 Acute respiratory failure with hypoxia: Secondary | ICD-10-CM | POA: Diagnosis not present

## 2023-03-06 LAB — LACTATE DEHYDROGENASE, PLEURAL OR PERITONEAL FLUID: LD, Fluid: 229 U/L — ABNORMAL HIGH (ref 3–23)

## 2023-03-06 LAB — COMPREHENSIVE METABOLIC PANEL
ALT: 9 U/L (ref 0–44)
AST: 16 U/L (ref 15–41)
Albumin: 3.1 g/dL — ABNORMAL LOW (ref 3.5–5.0)
Alkaline Phosphatase: 63 U/L (ref 38–126)
Anion gap: 7 (ref 5–15)
BUN: 18 mg/dL (ref 8–23)
CO2: 27 mmol/L (ref 22–32)
Calcium: 8.5 mg/dL — ABNORMAL LOW (ref 8.9–10.3)
Chloride: 104 mmol/L (ref 98–111)
Creatinine, Ser: 1.44 mg/dL — ABNORMAL HIGH (ref 0.61–1.24)
GFR, Estimated: 48 mL/min — ABNORMAL LOW (ref >60)
Glucose, Bld: 139 mg/dL — ABNORMAL HIGH (ref 70–99)
Potassium: 4.8 mmol/L (ref 3.5–5.1)
Sodium: 138 mmol/L (ref 135–145)
Total Bilirubin: 0.5 mg/dL (ref 0.3–1.2)
Total Protein: 7.5 g/dL (ref 6.5–8.1)

## 2023-03-06 LAB — BODY FLUID CELL COUNT WITH DIFFERENTIAL
Eos, Fluid: 0 %
Lymphs, Fluid: 71 %
Monocyte-Macrophage-Serous Fluid: 19 %
Neutrophil Count, Fluid: 10 %
Total Nucleated Cell Count, Fluid: 543 uL

## 2023-03-06 LAB — GLUCOSE, CAPILLARY: Glucose-Capillary: 112 mg/dL — ABNORMAL HIGH (ref 70–99)

## 2023-03-06 LAB — HIV ANTIBODY (ROUTINE TESTING W REFLEX): HIV Screen 4th Generation wRfx: NONREACTIVE

## 2023-03-06 LAB — CBC
HCT: 34.2 % — ABNORMAL LOW (ref 39.0–52.0)
Hemoglobin: 10.1 g/dL — ABNORMAL LOW (ref 13.0–17.0)
MCH: 28.2 pg (ref 26.0–34.0)
MCHC: 29.5 g/dL — ABNORMAL LOW (ref 30.0–36.0)
MCV: 95.5 fL (ref 80.0–100.0)
Platelets: 304 10*3/uL (ref 150–400)
RBC: 3.58 MIL/uL — ABNORMAL LOW (ref 4.22–5.81)
RDW: 15.7 % — ABNORMAL HIGH (ref 11.5–15.5)
WBC: 3.5 10*3/uL — ABNORMAL LOW (ref 4.0–10.5)
nRBC: 0 % (ref 0.0–0.2)

## 2023-03-06 LAB — GLUCOSE, PLEURAL OR PERITONEAL FLUID: Glucose, Fluid: 132 mg/dL

## 2023-03-06 LAB — PROTEIN, PLEURAL OR PERITONEAL FLUID: Total protein, fluid: 4.3 g/dL

## 2023-03-06 MED ORDER — VITAMIN C 500 MG PO TABS
500.0000 mg | ORAL_TABLET | Freq: Two times a day (BID) | ORAL | Status: DC
  Administered 2023-03-06 – 2023-03-10 (×8): 500 mg via ORAL
  Filled 2023-03-06 (×8): qty 1

## 2023-03-06 MED ORDER — CHLORHEXIDINE GLUCONATE CLOTH 2 % EX PADS
6.0000 | MEDICATED_PAD | Freq: Every day | CUTANEOUS | Status: DC
  Administered 2023-03-06: 6 via TOPICAL

## 2023-03-06 MED ORDER — LORAZEPAM 2 MG/ML IJ SOLN
0.5000 mg | Freq: Four times a day (QID) | INTRAMUSCULAR | Status: AC | PRN
  Administered 2023-03-06: 0.5 mg via INTRAVENOUS
  Filled 2023-03-06: qty 1

## 2023-03-06 MED ORDER — ADULT MULTIVITAMIN W/MINERALS CH
1.0000 | ORAL_TABLET | Freq: Every day | ORAL | Status: DC
  Administered 2023-03-07 – 2023-03-10 (×4): 1 via ORAL
  Filled 2023-03-06 (×4): qty 1

## 2023-03-06 MED ORDER — ENOXAPARIN SODIUM 40 MG/0.4ML IJ SOSY
40.0000 mg | PREFILLED_SYRINGE | INTRAMUSCULAR | Status: DC
  Administered 2023-03-06 – 2023-03-07 (×2): 40 mg via SUBCUTANEOUS
  Filled 2023-03-06 (×2): qty 0.4

## 2023-03-06 MED ORDER — MUPIROCIN 2 % EX OINT
1.0000 | TOPICAL_OINTMENT | Freq: Two times a day (BID) | CUTANEOUS | Status: DC
  Administered 2023-03-06 – 2023-03-10 (×7): 1 via NASAL
  Filled 2023-03-06 (×2): qty 22

## 2023-03-06 MED ORDER — CHLORHEXIDINE GLUCONATE CLOTH 2 % EX PADS
6.0000 | MEDICATED_PAD | Freq: Every day | CUTANEOUS | Status: DC
  Administered 2023-03-07: 6 via TOPICAL

## 2023-03-06 MED ORDER — ENSURE ENLIVE PO LIQD
237.0000 mL | Freq: Three times a day (TID) | ORAL | Status: DC
  Administered 2023-03-06 – 2023-03-10 (×10): 237 mL via ORAL

## 2023-03-06 MED ORDER — SODIUM CHLORIDE 0.9 % IV SOLN: INTRAVENOUS | Status: DC | PRN

## 2023-03-06 MED ORDER — LIDOCAINE HCL (PF) 1 % IJ SOLN
10.0000 mL | Freq: Once | INTRAMUSCULAR | Status: AC
  Administered 2023-03-06: 10 mL via INTRADERMAL
  Filled 2023-03-06: qty 10

## 2023-03-06 MED ORDER — HALOPERIDOL LACTATE 5 MG/ML IJ SOLN: 1.0000 mg | Freq: Four times a day (QID) | INTRAMUSCULAR | Status: DC | PRN

## 2023-03-06 MED ORDER — FUROSEMIDE 10 MG/ML IJ SOLN
40.0000 mg | Freq: Every day | INTRAMUSCULAR | Status: DC
  Administered 2023-03-06 – 2023-03-07 (×2): 40 mg via INTRAVENOUS
  Filled 2023-03-06 (×2): qty 4

## 2023-03-06 MED ORDER — AMIODARONE HCL 200 MG PO TABS
200.0000 mg | ORAL_TABLET | Freq: Two times a day (BID) | ORAL | Status: DC
  Administered 2023-03-07 – 2023-03-10 (×7): 200 mg via ORAL
  Filled 2023-03-06 (×7): qty 1

## 2023-03-06 NOTE — Progress Notes (Signed)
Initial Nutrition Assessment  DOCUMENTATION CODES:   Not applicable  INTERVENTION:   Ensure Enlive po TID, each supplement provides 350 kcal and 20 grams of protein.  MVI po daily   Vitamin C 500mg  po BID   Pt at high refeed risk; recommend monitor potassium, magnesium and phosphorus labs daily until stable  Daily weights   NUTRITION DIAGNOSIS:   Inadequate oral intake related to acute illness as evidenced by per patient/family report.  GOAL:   Patient will meet greater than or equal to 90% of their needs  MONITOR:   PO intake, Supplement acceptance, Labs, Weight trends, Skin, I & O's  REASON FOR ASSESSMENT:   Consult Assessment of nutrition requirement/status  ASSESSMENT:   88 y/o male with h/o GERD, COPD, HLD, CHF, AAA, chronic pain, HTN, MDD, anxiety, AVMs and substance abuse who is admitted with large R pleural effusion and L sided PNA.  Met with pt in room today. Pt reports poor appetite and oral intake for ~ 5 days pta. Pt reports that at baseline his oral intake is good. Pt reports that he has lost ~ 20lbs over the past few weeks. Per chart, pt is down ~20lbs over the past two weeks; this is likely r/t fluid changes. Pt appears to be back at his UBW currently. Pt reports that he has been drinking chocolate Equate shakes at home; pt is willing to drink chocolate Ensure in hospital. RD discussed with pt the importance of adequate nutrition needed to preserve lean muscle and to support wound healing. RD will add supplements and vitamins to help pt meet his estimated needs. Pt is likely at refeed risk.    Medications reviewed and include: lovenox, lasix, protonix, prednisone, ceftriaxone, doxycycline  Labs reviewed: K 4.8 wnl, creat 1.44(H) Wbc- 3.5(L), Hgb 10.1(L), Hct 34.2(L)  NUTRITION - FOCUSED PHYSICAL EXAM:  Flowsheet Row Most Recent Value  Orbital Region No depletion  Upper Arm Region No depletion  Thoracic and Lumbar Region No depletion  Buccal Region No  depletion  Temple Region No depletion  Clavicle Bone Region Mild depletion  Clavicle and Acromion Bone Region Mild depletion  Scapular Bone Region No depletion  Dorsal Hand No depletion  Patellar Region Moderate depletion  Anterior Thigh Region Moderate depletion  Posterior Calf Region Moderate depletion  Edema (RD Assessment) Mild  Hair Reviewed  Eyes Reviewed  Mouth Reviewed  Skin Reviewed  Nails Reviewed   Diet Order:   Diet Order             Diet heart healthy/carb modified Room service appropriate? Yes; Fluid consistency: Thin  Diet effective now                  EDUCATION NEEDS:   Education needs have been addressed  Skin:  Skin Assessment: Reviewed RN Assessment (excision of melanoma sites L forearm with placement skin substitute 13.8 x 11 cm)  Last BM:  pta  Height:   Ht Readings from Last 1 Encounters:  03/06/23 5\' 11"  (1.803 m)    Weight:   Wt Readings from Last 1 Encounters:  03/06/23 95.3 kg    Ideal Body Weight:  78.1 kg  BMI:  Body mass index is 29.3 kg/m.  Estimated Nutritional Needs:   Kcal:  1900-2200kcal/day  Protein:  95-110g/day  Fluid:  1.9-2.2L/day  Betsey Holiday MS, RD, LDN Please refer to [facility] for RD and/or RD on-call/weekend/after hours pager

## 2023-03-06 NOTE — Procedures (Signed)
Ultrasound-guided diagnostic and therapeutic right sided thoracentesis performed yielding 500 milliliters of serosanguinous colored fluid. No immediate complications.   Diagnostic fluid was sent to the lab for further analysis. Follow-up chest x-ray pending. EBL is < 2 ml.

## 2023-03-06 NOTE — Consult Note (Signed)
WOC Nurse Consult Note: patient underwent wide excision of melanoma sites L forearm with placement skin substitute Dr. Donell Beers 02/14/2023; last wound care orders 9/12 for this area leave myriad in place, cover with surgilube, non stick dressing, roll gauze and Ace for compression  Reason for Consult: L forearm wound  Wound type: full thickness surgical as above  Pressure Injury POA: NA, not pressure  Measurement: 13.8 x 11 cm per Dr. Donell Beers note  Wound bed:not visible Drainage (amount, consistency, odor) minimal tan noted Periwound: some edema, Ace wrap not replaced at this visit  Dressing procedure/placement/frequency: Apply a generous amount of surgilube on top of blue green material covering wound, top with Telfa non-stick dressing, secure with Kerlix roll gauze.  May cover with Ace bandage for light compression to area.   Patient is to follow-up as scheduled with Dr. Arita Miss office.   POC discussed with bedside nurse. WOC team will not follow at this time. Re-consult if further needs arise.   Thank you,    Priscella Mann MSN, RN-BC, [employer] [phone]

## 2023-03-06 NOTE — Consult Note (Signed)
Cardiology Consultation   Patient ID: Aaron Gibbs MRN: [id number]; DOB: [birth year]  Admit date: 03/05/2023 Date of Consult: 03/06/2023  PCP:  Smitty Cords, DO   [facility] Providers Cardiologist:  Julien Nordmann, MD   {   Patient Profile:   Aaron Gibbs is a 88 y.o. 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 Dareen Piano.  History of Present Illness:   Aaron Gibbs was hospitalize at [facility] from 02/17/2023 through 02/22/2023 due to generalized weakness.  In the ED, he was found to have ventricular tachycardia that was asymptomatic.  His hospital course was complicated by delirium.  Given his age and comorbidities, catheterization and EP consultation were not pursued.  He was discharged on oral amiodarone.  Aaron Gibbs reports that for the last few days, he has felt increasingly short of breath.  He has not noticed any frank edema, though it is challenging for him to know because of his blindness.  He denies chest pain and palpitations.  He has not had any subjective fevers or chills.  In the ED yesterday, he was found to be hypoxic requiring 4 L of supplemental oxygen via nasal cannula.  Imaging of the chest showed a large right pleural effusion that had grown considerably from his last CTA on 02/17/2023.  He was given furosemide 40 mg IV x 1 and then placed on furosemide 40 mg p.o. daily.  He feels like his breathing is a bit better though he attributes this to being on supplemental oxygen.   Past Medical History:  Diagnosis Date   (HFpEF) heart failure with preserved ejection fraction (HCC)    AAA (abdominal aortic aneurysm) without rupture (HCC)    5cm/Followed by Festus Barren  MD/Vascular/[facility]   Ankylosing spondylitis lumbar region [facility])    Anxiety    C2 cervical fracture (HCC) 09/2022   Traumatic Fall. Medically manage/no surgical intervention/Soft Collar   Chronic pain after traumatic injury 09/2022   Traumatic Fall= C2 Fracture. 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 malignant melanoma of skin (HCC)    Left Forearm   Major depressive disorder, recurrent episode, severe (HCC)    Melanoma in situ of left upper arm (HCC)    s/p excision 02/15/23   Multiple falls    Opioid dependence (HCC)    Osteoarthritis    Retinitis pigmentosa of both eyes    Legally Blind   Sensorineural hearing loss (SNHL) of both ears    Suicide attempt (HCC) (05/13/18) 06/26/2018    Past Surgical History:  Procedure Laterality Date   CHOLECYSTECTOMY     COLONOSCOPY     EXCISION MELANOMA WITH SENTINEL LYMPH NODE BIOPSY Left 01/11/2023   Procedure: CAUTERIZATION OF BLEEDING WOUND ON LEFT FOREARM;  Surgeon: [name], MD;  Location: [facility] ORS;  Service: General;  Laterality: Left;   EXCISION MELANOMA WITH SENTINEL LYMPH NODE BIOPSY N/A 02/15/2023   Procedure: WIDE LOCAL EXCISION LEFT FOREARM MELANOMA SITES WITH SKIN SUBSTITUTE COVERAGE, SENTINEL LYMPH NODE  BIOPSY;  Surgeon: [name], MD;  Location: [facility] OR;  Service: General;  Laterality: N/A;   TRANSURETHRAL RESECTION OF PROSTATE N/A 2008      Inpatient Medications: Scheduled Meds:  [START ON 03/07/2023] amiodarone  200 mg Oral BID   vitamin C  500 mg Oral BID   Chlorhexidine Gluconate Cloth  6 each Topical Daily   Chlorhexidine Gluconate Cloth  6 each Topical Q0600   dorzolamide-timolol  1 drop Both Eyes BID   enoxaparin (LOVENOX) injection  50 mg Subcutaneous Q24H   feeding supplement   237 mL Oral TID BM   finasteride  5 mg Oral Daily   furosemide  40 mg Intravenous Daily   gabapentin  300 mg Oral BID   [START ON 03/07/2023] multivitamin with minerals  1 tablet Oral Daily   mupirocin ointment  1 Application Nasal BID   OLANZapine  2.5 mg Oral BID   pantoprazole (PROTONIX) IV  40 mg Intravenous Q12H   predniSONE  40 mg Oral Q breakfast   rosuvastatin  5 mg Oral QPM   tamsulosin  0.4 mg Oral Daily   Continuous Infusions:  cefTRIAXone (ROCEPHIN)  IV     doxycycline (VIBRAMYCIN) IV Stopped (03/06/23 0738)   PRN Meds: acetaminophen, HYDROcodone-acetaminophen, ondansetron **OR** ondansetron (ZOFRAN) IV  Allergies:    Allergies  Allergen Reactions   Anthralin Shortness Of Breath   Azithromycin Shortness Of Breath    Social History:   Social History   Tobacco Use   Smoking status: Former    Types: Cigarettes   Smokeless tobacco: Former    Types: Chew   Tobacco comments:    stopped 15 years ago  Vaping Use   Vaping status: Never Used  Substance Use Topics   Alcohol use: Not Currently    Comment: past   Drug use: Never     Family History:   Family History  Problem Relation Age of Onset   Depression Mother    Heart disease Father      ROS:  Please see the history of present illness. All other ROS reviewed and negative.     Physical Exam/Data:   Vitals:   03/06/23 1000 03/06/23 1100 03/06/23 1200 03/06/23 1300  BP: (!) 134/53 (!) 140/52 (!) 118/53 (!) 142/57  Pulse: 66 62 61 65  Resp: 18 16 15 18   Temp:      TempSrc:      SpO2: 97% 97% 97% 99%  Weight:      Height:        Intake/Output Summary (Last 24 hours) at 03/06/2023 1422 Last data filed at 03/06/2023 0738 Gross per 24 hour  Intake 500 ml  Output 525 ml  Net -25 ml      03/06/2023    8:55 AM 03/05/2023   12:12 PM 02/22/2023    4:57 AM  Last 3 Weights  Weight (lbs) 210 lb 1.6 oz 229 lb 4.5 oz 229 lb 15 oz  Weight (kg) 95.3 kg 104 kg 104.3 kg     Body mass index is 29.3 kg/m.   General: Chronically ill-appearing man lying on his right side in bed. HEENT: Cervical spine collar in place. Neck: Unable to assess due to positioning in cervical spine collar. Cardiac: Regular rate and rhythm without murmurs Lungs: Absent breath sounds on the right.  Left lung clear. Abd: soft, nontender, no hepatomegaly  Ext: Focal left forearm swelling, partially covered by bandage at site of melanoma excision.  No lower extremity edema. Musculoskeletal:  No deformities, BUE and BLE strength normal and equal Skin: warm and dry  Neuro:  CNs 2-12 intact, no focal abnormalities noted Psych:  Normal affect   EKG:  The EKG was personally reviewed and demonstrates: Normal sinus rhythm with mild QT prolongation. Telemetry:  Telemetry was personally reviewed and demonstrates: Normal sinus rhythm.  Relevant CV Studies: TTE (02/17/2023):  1. Left ventricular ejection fraction, by estimation, is 55 to 60%. The  left ventricle has normal function. The left ventricle has no regional  wall motion abnormalities. Left ventricular diastolic parameters are  indeterminate.   2. Right ventricular systolic function is normal. The right ventricular  size is normal. Tricuspid regurgitation signal is inadequate for assessing  PA pressure.   3. Left atrial size was moderately dilated.   4. The mitral valve is grossly normal. Trivial mitral valve  regurgitation. No evidence of mitral stenosis.   5. The aortic valve was not well visualized. There is mild calcification  of the aortic valve. There is mild thickening of the aortic valve. Aortic  valve regurgitation is mild to moderate. Aortic valve  sclerosis/calcification is present, without any  evidence of aortic stenosis.   6. Aortic dilatation noted. There is borderline dilatation of the aortic  root, measuring 40 mm.   Laboratory Data:  High Sensitivity Troponin:   Recent Labs  Lab 02/17/23 1837 02/17/23 2033 03/05/23 1217 03/05/23 1417   TROPONINIHS 14 15 11 12      Chemistry Recent Labs  Lab 03/05/23 1217 03/06/23 0430  NA 137 138  K 5.1 4.8  CL 106 104  CO2 23 27  GLUCOSE 112* 139*  BUN 17 18  CREATININE 1.31* 1.44*  CALCIUM 8.5* 8.5*  GFRNONAA 53* 48*  ANIONGAP 8 7    Recent Labs  Lab 03/05/23 1217 03/06/23 0430  PROT 6.4* 7.5  ALBUMIN 3.0* 3.1*  AST 25 16  ALT 8 9  ALKPHOS 58 63  BILITOT 0.7 0.5   Lipids No results for input(s): "CHOL", "TRIG", "HDL", "LABVLDL", "LDLCALC", "CHOLHDL" in the last 168 hours.  Hematology Recent Labs  Lab 03/05/23 1217 03/06/23 0430  WBC 4.8 3.5*  RBC 3.48* 3.58*  HGB 10.1* 10.1*  HCT 33.3* 34.2*  MCV 95.7 95.5  MCH 29.0 28.2  MCHC 30.3 29.5*  RDW 15.9* 15.7*  PLT 253 304   Thyroid No results for input(s): "TSH", "FREET4" in the last 168 hours.  BNP Recent Labs  Lab 03/05/23 1217  BNP 27.5    DDimer No results for input(s): "DDIMER" in the last 168 hours.   Radiology/Studies:  CT Chest W Contrast  Result Date: 03/05/2023 CLINICAL DATA:  Shortness of breath EXAM: CT CHEST WITH CONTRAST TECHNIQUE: Multidetector CT imaging of the chest was performed during intravenous contrast administration. RADIATION DOSE REDUCTION: This exam was performed according to the departmental dose-optimization program which includes automated exposure control, adjustment of the mA and/or kV according to patient size and/or use of iterative reconstruction technique. CONTRAST:  80mL OMNIPAQUE IOHEXOL 350 MG/ML SOLN COMPARISON:  CT chest angio dated February 18, 2023 FINDINGS: Cardiovascular: Normal heart size. No pericardial effusion. Normal caliber thoracic aorta with moderate atherosclerotic disease. Moderate coronary artery calcifications. No suspicious filling defects of the central pulmonary arteries. Mediastinum/Nodes: Esophagus thyroid are unremarkable. Fluid collection of the left axilla with adjacent surgical clips measuring 7.1 x 1.6 cm on series 3, image 58. No enlarged  lymph nodes seen in the chest. Lungs/Pleura: Central airways are patent. Increased large right pleural effusion causing complete  collapse of the right lower and middle lobes and near-complete collapse of the right upper lobe. New trace left pleural effusion. Decreased nodular opacity of the posterior left upper lobe measuring 2.6 x 1.3 cm, previously 3.2 x 1.7 cm. Similar patchy linear nodular opacities of the left upper lobe. Newnodule of the left hemithorax measuring 11 x 7 mm located along the oblique fissure on series 4, image 82, likely reactive pulmonary lymph node. New solid pulmonary nodule of the left lower lobe measuring 9 x 5 mm on series 4, image 103. Upper Abdomen: Partially visualized abdominal aortic aneurysm, better seen on prior. Musculoskeletal: No chest wall abnormality. Diffuse ankylosis of the thoracic spine. No aggressive appearing osseous lesions. IMPRESSION: 1. Large right pleural effusion causing complete collapse of the right lower and middle lobes and near-complete collapse of the right upper lobe. 2. New trace left pleural effusion. 3. Decreased nodular opacity of the posterior left upper lobe. Recommend short-term follow-up chest CT in 3 months to ensure resolution. 4. New solid pulmonary nodule of the left lower lobe, likely infectious or inflammatory given rapid interval development. 5. New well-defined solid nodule located along the oblique left fissure, likely reactive pulmonary lymph node. Recommend attention on follow-up. 6. Similar patchy linear nodular opacities of the left upper lobe. Recommend attention on follow-up. 7. Fluid collection of the left axilla with adjacent surgical clips, likely a postsurgical seroma. 8. Aortic Atherosclerosis (ICD10-I70.0). Electronically Signed   By: Allegra Lai M.D.   On: 03/05/2023 16:28   DG Chest Portable 1 View  Result Date: 03/05/2023 CLINICAL DATA:  Shortness of breath EXAM: PORTABLE CHEST - 1 VIEW COMPARISON:  02/17/2023  FINDINGS: Cardiomediastinal silhouette and pulmonary vasculature are within normal limits. Interval development of large right pleural effusion. Additional patchy opacities also present in the left mid to lower lung. IMPRESSION: 1. Interval development of large right pleural effusion. 2. New patchy opacities in the left mid to lower lung suspicious for pneumonia. Electronically Signed   By: Acquanetta Belling M.D.   On: 03/05/2023 14:30     Assessment and Plan:   Acute respiratory failure with hypoxia and right pleural effusion: I suspect the patient's dyspnea and oxygen requirement are driven primarily by his large right pleural effusion with near complete collapse of the right lung.  The effusion has enlarged fairly quickly over the last few weeks.  He otherwise appears euvolemic, making acute heart failure less likely because of this.  Given his history of malignancy, a malignant pleural effusion is a concern.  I recommend escalation of diuresis back to 40 mg IV daily, with close monitoring of the patient's renal function.  He would likely benefit most from a right thoracentesis from a diagnostic and therapeutic standpoint.  Gibbs empiric antibiotics for possible component of pneumonia to the primary  Ventricular tachycardia: No significant ventricular ectopy identified this admission.  I recommend de-escalation of amiodarone to 200 mg twice daily.  Given comorbidities, we will Gibbs anemia workup and EP consultation unless the patient has further VT.  COPD: Ongoing therapy per primary team.  For questions or updates, please contact [facility] Please consult [email or web address] for contact info under [facility].  Signed, Yvonne Kendall, MD  03/06/2023 2:22 PM

## 2023-03-06 NOTE — Progress Notes (Signed)
PT Cancellation Note  Patient Details Name: Arther Dwinell MRN: [id number] DOB: 05-Jan-1935   Cancelled Treatment:    Reason Eval/Treat Not Completed: Other (comment) (Patient declined mobility today, reporting he feels sick and is requesting for PT to follow up tomorrow. PT will continue with attempts.)  Donna Bernard, PT, MPT  Ina Homes 03/06/2023, 11:14 AM

## 2023-03-06 NOTE — ED Notes (Signed)
Patient up at bedside with walker and assistance from this RN to use the urinal. Patient tolerated standing for about 2 minutes while urinating. Patient urinated 300 mL of clear, yellow urine. Patient cleaned up and placed in new brief and hospital gown.

## 2023-03-06 NOTE — Progress Notes (Signed)
OT Cancellation Note  Patient Details Name: Kohl Lauderman MRN: [id number] DOB: 17-Jun-1935   Cancelled Treatment:    Reason Eval/Treat Not Completed: Patient declined, no reason specified. Consult received, chart reviewed. Spoke with PT. Patient declining therapy today citing feeling sick. Agreeable to therapy attempts next date.   Arman Filter., MPH, MS, OTR/L ascom [phone] 03/06/23, 11:20 AM

## 2023-03-06 NOTE — Progress Notes (Signed)
PROGRESS NOTE  Abram Plesha    DOB: 1934-09-22, 88 y.o.  ZOX:[id number]    Code Status: Limited: Do not attempt resuscitation (DNR) -DNR-LIMITED -Do Not Intubate/DNI    DOA: 03/05/2023   LOS: 1   Brief hospital course  York Harcum is a 88 y.o. 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  They presented from home to the ED on 03/05/2023 with acute SOB x 2-3 days. Was recently discharged from hospital stay on 9/5 after receiving care for ventricular tachycardia. No oxygen requirement at baseline.   In the ED, it was found that they had Tmax nine 9.2, heart rate 60s, respirations stable, blood pressure 100s to 150s over 70s. Satting in the mid 90s on 4 L.  Significant findings included White count 4.8, hemoglobin 10.1, platelets 253, creatinine 1.31.  Chest x-ray with interval development of large right pleural effusion. No patchy opacities in the left midlung and lower lung suspicious for pneumonia.  They were initially treated with amiodarone, lasix, CTX, doxycycline.   Patient was admitted to medicine service for further workup and management of acute hypoxic respiratory failure as outlined in detail below.  03/06/23 -stable. Thoracentesis today.   Assessment & Plan  Active Problems:   Acute respiratory failure with hypoxia (HCC)   COPD (chronic obstructive pulmonary disease) (HCC)   Chronic heart failure with preserved ejection fraction (HCC)   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 (HCC)   GERD (gastroesophageal reflux disease)   HLD (hyperlipidemia)   Infrarenal abdominal aortic aneurysm (AAA) without rupture (HCC)   Pleural effusion  Acute respiratory failure with hypoxia (HCC) Acute R- pleural effusion- only small effusion present on recent admission for Vtach so accumulated significant  fluid since 8/31. Requiring 4L [place] without baseline oxygen support.  - IR consulted for thoracentesis. Diagnostic testing ordered on pleural fluid.   - removed 500cc. F/u chest xray neg for pneumothorax - continue Rocephin and Doxy - f/u cultures/labs - maintain supplemental O2 - IV diuresis  H/o ventricular tachycardia- appears to be well controlled this admission.  - cardiology consulted, appreciate your recs  - continue amiodarone at 200mg  BID    Chronic heart failure with preserved ejection fraction [facility]) 2D echo September 2024 with EF 55 to 60%. Euvolemic  - IV diurese, per cards   COPD- does not appear to have significant reactive/inflammatory component to respiratory decline. No wheezing on exam. No cough or increased sputum production. Is on empiric Abx - DuoNebs PRN    Infrarenal abdominal aortic aneurysm (AAA) without rupture (HCC) preoperative clearance for endovascular repair as per vasc surg. Last aneurysm size was 5 cm    HLD (hyperlipidemia) Continue statin   GERD (gastroesophageal reflux disease) PPI  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 [facility]) s/p resection 02/15/2023. Continue on doxycycline. Management per onco   Body mass index is 31.98 kg/m.  VTE ppx: SCDs Start: 03/05/23 1515  Diet:     Diet   Diet heart healthy/carb modified Room service appropriate? Yes; Fluid consistency: Thin   Consultants: IR Cardiology   Subjective 03/06/23    Pt reports feeling unwell. He is unable to further discuss what is unwell. His breathing status "has been better" and "a little labored". Denies any pain. Is not up for conversation.    Objective   Vitals:   03/06/23 0200 03/06/23  0226 03/06/23 0644 03/06/23 0736  BP: 137/69  126/87 (!) 161/54  Pulse: 64 67 70 79  Resp:  (!) 22 (!) 22 (!) 25  Temp:    (!) 97.5 F (36.4 C)  TempSrc:    Oral  SpO2:  97% 97% 97%  Weight:      Height:        Intake/Output  Summary (Last 24 hours) at 03/06/2023 0754 Last data filed at 03/06/2023 0738 Gross per 24 hour  Intake 500 ml  Output 525 ml  Net -25 ml   Filed Weights   03/05/23 1212  Weight: 104 kg    Physical Exam:  General: awake, alert, mild distress, tired appearing HEENT: anicteric sclera, MMM, hearing grossly normal Respiratory: normal respiratory effort. Decreased lung sounds diffusely, particularly R side. Positive crackles. No wheezing.  Cardiovascular: quick capillary refill, normal S1/S2, RRR, no JVD, murmurs Nervous: A&O x3. no gross focal neurologic deficits, normal speech Extremities: moves all equally, no edema, normal tone Skin: dry, intact, normal temperature, normal color. No rashes, lesions or ulcers on exposed skin Psychiatry: flat mood, congruent affect  Labs   I have personally reviewed the following labs and imaging studies CBC    Component Value Date/Time   WBC 3.5 (L) 03/06/2023 0430   RBC 3.58 (L) 03/06/2023 0430   HGB 10.1 (L) 03/06/2023 0430   HCT 34.2 (L) 03/06/2023 0430   PLT 304 03/06/2023 0430   MCV 95.5 03/06/2023 0430   MCH 28.2 03/06/2023 0430   MCHC 29.5 (L) 03/06/2023 0430   RDW 15.7 (H) 03/06/2023 0430   LYMPHSABS 0.8 03/05/2023 1217   MONOABS 1.1 (H) 03/05/2023 1217   EOSABS 0.2 03/05/2023 1217   BASOSABS 0.0 03/05/2023 1217      Latest Ref Rng & Units 03/06/2023    4:30 AM 03/05/2023   12:17 PM 02/22/2023    4:45 AM  BMP  Glucose 70 - 99 mg/dL 284  132  440   BUN 8 - 23 mg/dL 18  17  21    Creatinine 0.61 - 1.24 mg/dL 1.02  7.25  3.66   Sodium 135 - 145 mmol/L 138  137  138   Potassium 3.5 - 5.1 mmol/L 4.8  5.1  3.9   Chloride 98 - 111 mmol/L 104  106  104   CO2 22 - 32 mmol/L 27  23  27    Calcium 8.9 - 10.3 mg/dL 8.5  8.5  8.4     CT Chest W Contrast  Result Date: 03/05/2023 CLINICAL DATA:  Shortness of breath EXAM: CT CHEST WITH CONTRAST TECHNIQUE: Multidetector CT imaging of the chest was performed during intravenous contrast  administration. RADIATION DOSE REDUCTION: This exam was performed according to the departmental dose-optimization program which includes automated exposure control, adjustment of the mA and/or kV according to patient size and/or use of iterative reconstruction technique. CONTRAST:  80mL OMNIPAQUE IOHEXOL 350 MG/ML SOLN COMPARISON:  CT chest angio dated February 18, 2023 FINDINGS: Cardiovascular: Normal heart size. No pericardial effusion. Normal caliber thoracic aorta with moderate atherosclerotic disease. Moderate coronary artery calcifications. No suspicious filling defects of the central pulmonary arteries. Mediastinum/Nodes: Esophagus thyroid are unremarkable. Fluid collection of the left axilla with adjacent surgical clips measuring 7.1 x 1.6 cm on series 3, image 58. No enlarged lymph nodes seen in the chest. Lungs/Pleura: Central airways are patent. Increased large right pleural effusion causing complete collapse of the right lower and middle lobes and near-complete collapse of the right upper lobe. New  trace left pleural effusion. Decreased nodular opacity of the posterior left upper lobe measuring 2.6 x 1.3 cm, previously 3.2 x 1.7 cm. Similar patchy linear nodular opacities of the left upper lobe. Newnodule of the left hemithorax measuring 11 x 7 mm located along the oblique fissure on series 4, image 82, likely reactive pulmonary lymph node. New solid pulmonary nodule of the left lower lobe measuring 9 x 5 mm on series 4, image 103. Upper Abdomen: Partially visualized abdominal aortic aneurysm, better seen on prior. Musculoskeletal: No chest wall abnormality. Diffuse ankylosis of the thoracic spine. No aggressive appearing osseous lesions. IMPRESSION: 1. Large right pleural effusion causing complete collapse of the right lower and middle lobes and near-complete collapse of the right upper lobe. 2. New trace left pleural effusion. 3. Decreased nodular opacity of the posterior left upper lobe. Recommend  short-term follow-up chest CT in 3 months to ensure resolution. 4. New solid pulmonary nodule of the left lower lobe, likely infectious or inflammatory given rapid interval development. 5. New well-defined solid nodule located along the oblique left fissure, likely reactive pulmonary lymph node. Recommend attention on follow-up. 6. Similar patchy linear nodular opacities of the left upper lobe. Recommend attention on follow-up. 7. Fluid collection of the left axilla with adjacent surgical clips, likely a postsurgical seroma. 8. Aortic Atherosclerosis (ICD10-I70.0). Electronically Signed   By: Allegra Lai M.D.   On: 03/05/2023 16:28   DG Chest Portable 1 View  Result Date: 03/05/2023 CLINICAL DATA:  Shortness of breath EXAM: PORTABLE CHEST - 1 VIEW COMPARISON:  02/17/2023 FINDINGS: Cardiomediastinal silhouette and pulmonary vasculature are within normal limits. Interval development of large right pleural effusion. Additional patchy opacities also present in the left mid to lower lung. IMPRESSION: 1. Interval development of large right pleural effusion. 2. New patchy opacities in the left mid to lower lung suspicious for pneumonia. Electronically Signed   By: Acquanetta Belling M.D.   On: 03/05/2023 14:30    Disposition Plan & Communication  Patient status: Inpatient  Admitted From: Home Planned disposition location: Home Anticipated discharge date: 9/19 pending clinical improvement  Family Communication: none at bedside    Author: Leeroy Bock, DO [facility] 03/06/2023, 7:54 AM   Available by Epic secure chat 7AM-7PM. If 7PM-7AM, please contact night-coverage.  [facility] contact information found on [email or web address].

## 2023-03-07 DIAGNOSIS — J9601 Acute respiratory failure with hypoxia: Secondary | ICD-10-CM | POA: Diagnosis not present

## 2023-03-07 DIAGNOSIS — J441 Chronic obstructive pulmonary disease with (acute) exacerbation: Secondary | ICD-10-CM

## 2023-03-07 DIAGNOSIS — J90 Pleural effusion, not elsewhere classified: Secondary | ICD-10-CM | POA: Diagnosis not present

## 2023-03-07 DIAGNOSIS — C779 Secondary and unspecified malignant neoplasm of lymph node, unspecified: Secondary | ICD-10-CM | POA: Diagnosis not present

## 2023-03-07 DIAGNOSIS — I5032 Chronic diastolic (congestive) heart failure: Secondary | ICD-10-CM

## 2023-03-07 DIAGNOSIS — Z9889 Other specified postprocedural states: Secondary | ICD-10-CM | POA: Diagnosis not present

## 2023-03-07 DIAGNOSIS — C439 Malignant melanoma of skin, unspecified: Secondary | ICD-10-CM | POA: Diagnosis not present

## 2023-03-07 LAB — LACTATE DEHYDROGENASE: LDH: 125 U/L (ref 98–192)

## 2023-03-07 MED ORDER — LORAZEPAM 0.5 MG PO TABS
0.5000 mg | ORAL_TABLET | Freq: Two times a day (BID) | ORAL | Status: DC | PRN
  Administered 2023-03-07 – 2023-03-08 (×2): 0.5 mg via ORAL
  Filled 2023-03-07 (×2): qty 1

## 2023-03-07 MED ORDER — CARVEDILOL 6.25 MG PO TABS
6.2500 mg | ORAL_TABLET | Freq: Two times a day (BID) | ORAL | Status: DC
  Administered 2023-03-07 – 2023-03-08 (×2): 6.25 mg via ORAL
  Filled 2023-03-07 (×3): qty 1

## 2023-03-07 NOTE — Plan of Care (Signed)
  Problem: Education: Goal: Knowledge of condition and prescribed therapy will improve Outcome: Progressing   Problem: Cardiac: Goal: Will achieve and/or maintain adequate cardiac output Outcome: Progressing   Problem: Physical Regulation: Goal: Complications related to the disease process, condition or treatment will be avoided or minimized Outcome: Progressing   Problem: Activity: Goal: Ability to tolerate increased activity will improve Outcome: Progressing   Problem: Clinical Measurements: Goal: Ability to maintain a body temperature in the normal range will improve Outcome: Progressing   Problem: Respiratory: Goal: Ability to maintain adequate ventilation will improve Outcome: Progressing Goal: Ability to maintain a clear airway will improve Outcome: Progressing   Problem: Education: Goal: Knowledge of General Education information will improve Description: Including pain rating scale, medication(s)/side effects and non-pharmacologic comfort measures Outcome: Progressing   Problem: Health Behavior/Discharge Planning: Goal: Ability to manage health-related needs will improve Outcome: Progressing   Problem: Clinical Measurements: Goal: Ability to maintain clinical measurements within normal limits will improve Outcome: Progressing Goal: Will remain free from infection Outcome: Progressing Goal: Diagnostic test results will improve Outcome: Progressing Goal: Respiratory complications will improve Outcome: Progressing Goal: Cardiovascular complication will be avoided Outcome: Progressing   Problem: Activity: Goal: Risk for activity intolerance will decrease Outcome: Progressing   Problem: Nutrition: Goal: Adequate nutrition will be maintained Outcome: Progressing   Problem: Coping: Goal: Level of anxiety will decrease Outcome: Progressing   Problem: Elimination: Goal: Will not experience complications related to bowel motility Outcome: Progressing Goal:  Will not experience complications related to urinary retention Outcome: Progressing   Problem: Pain Managment: Goal: General experience of comfort will improve Outcome: Progressing   Problem: Safety: Goal: Ability to remain free from injury will improve Outcome: Progressing   Problem: Skin Integrity: Goal: Risk for impaired skin integrity will decrease Outcome: Progressing

## 2023-03-07 NOTE — Progress Notes (Signed)
Rounding Note    Patient Name: Aaron Gibbs Date of Encounter: 03/07/2023  [facility] Cardiologist:   Subjective   Resting comfortably in bed, reports troubled by neck pain Reports his breathing has improved, much more comfortable Thoracentesis yesterday, 500 cc out on right  Inpatient Medications    Scheduled Meds:  amiodarone  200 mg Oral BID   vitamin C  500 mg Oral BID   Chlorhexidine Gluconate Cloth  6 each Topical Daily   Chlorhexidine Gluconate Cloth  6 each Topical Q0600   dorzolamide-timolol  1 drop Both Eyes BID   enoxaparin (LOVENOX) injection  40 mg Subcutaneous Q24H   feeding supplement  237 mL Oral TID BM   finasteride  5 mg Oral Daily   furosemide  40 mg Intravenous Daily   gabapentin  300 mg Oral BID   multivitamin with minerals  1 tablet Oral Daily   mupirocin ointment  1 Application Nasal BID   OLANZapine  2.5 mg Oral BID   pantoprazole (PROTONIX) IV  40 mg Intravenous Q12H   predniSONE  40 mg Oral Q breakfast   rosuvastatin  5 mg Oral QPM   tamsulosin  0.4 mg Oral Daily   Continuous Infusions:  sodium chloride Stopped (03/06/23 1802)   cefTRIAXone (ROCEPHIN)  IV 200 mL/hr at 03/06/23 1820   doxycycline (VIBRAMYCIN) IV 100 mg (03/07/23 0621)   PRN Meds: sodium chloride, acetaminophen, HYDROcodone-acetaminophen, ondansetron **OR** ondansetron (ZOFRAN) IV   Vital Signs    Vitals:   03/06/23 2226 03/07/23 0500 03/07/23 0523 03/07/23 0838  BP: (!) 142/53  (!) 122/43 137/68  Pulse: 80  60 66  Resp: 20  18 16   Temp: 97.9 F (36.6 C)  (!) 97.5 F (36.4 C) 97.6 F (36.4 C)  TempSrc: Oral  Oral Oral  SpO2: 94%  98% 100%  Weight: 95.7 kg 95.6 kg    Height: 5\' 10"  (1.778 m)       Intake/Output Summary (Last 24 hours) at 03/07/2023 1233 Last data filed at 03/07/2023 0640 Gross per 24 hour  Intake 291.53 ml  Output 0 ml  Net 291.53 ml      03/07/2023    5:00 AM 03/06/2023   10:26 PM 03/06/2023    8:55 AM  Last 3  Weights  Weight (lbs) 210 lb 12.2 oz 210 lb 15.7 oz 210 lb 1.6 oz  Weight (kg) 95.6 kg 95.7 kg 95.3 kg      Telemetry    Normal sinus rhythm- Personally Reviewed  ECG     - Personally Reviewed  Physical Exam   GEN: No acute distress.   Neck: No JVD Cardiac: RRR, no murmurs, rubs, or gallops.  Respiratory: Clear to auscultation bilaterally. GI: Soft, nontender, non-distended  MS: No edema; No deformity. Neuro:  Nonfocal  Psych: Normal affect   Labs    High Sensitivity Troponin:   Recent Labs  Lab 02/17/23 1837 02/17/23 2033 03/05/23 1217 03/05/23 1417  TROPONINIHS 14 15 11 12      Chemistry Recent Labs  Lab 03/05/23 1217 03/06/23 0430 03/07/23 0629  NA 137 138 140  K 5.1 4.8 3.9  CL 106 104 105  CO2 23 27 26   GLUCOSE 112* 139* 112*  BUN 17 18 27*  CREATININE 1.31* 1.44* 1.42*  CALCIUM 8.5* 8.5* 8.4*  MG  --   --  2.4  PROT 6.4* 7.5 6.5  ALBUMIN 3.0* 3.1* 2.9*  AST 25 16 16   ALT 8 9 9   ALKPHOS  58 63 55  BILITOT 0.7 0.5 0.5  GFRNONAA 53* 48* 48*  ANIONGAP 8 7 9     Lipids No results for input(s): "CHOL", "TRIG", "HDL", "LABVLDL", "LDLCALC", "CHOLHDL" in the last 168 hours.  Hematology Recent Labs  Lab 03/05/23 1217 03/06/23 0430 03/07/23 0629  WBC 4.8 3.5* 5.5  RBC 3.48* 3.58* 3.28*  HGB 10.1* 10.1* 9.5*  HCT 33.3* 34.2* 31.2*  MCV 95.7 95.5 95.1  MCH 29.0 28.2 29.0  MCHC 30.3 29.5* 30.4  RDW 15.9* 15.7* 15.9*  PLT 253 304 316   Thyroid No results for input(s): "TSH", "FREET4" in the last 168 hours.  BNP Recent Labs  Lab 03/05/23 1217  BNP 27.5    DDimer No results for input(s): "DDIMER" in the last 168 hours.   Radiology    US THORACENTESIS ASP PLEURAL SPACE W/IMG GUIDE  Result Date: 03/07/2023 INDICATION: 88 year old male. History of metastatic melanoma and CHF. Present in the right-sided pleural effusion. Request is for therapeutic and diagnostic thoracentesis EXAM: ULTRASOUND GUIDED THERAPEUTIC AND DIAGNOSTIC RIGHT-SIDED  THORACENTESIS MEDICATIONS: Lidocaine 1% 10 mL COMPLICATIONS: None immediate. PROCEDURE: An ultrasound guided thoracentesis was thoroughly discussed with the patient and questions answered. The benefits, risks, alternatives and complications were also discussed. The patient understands and wishes to proceed with the procedure. Written consent was obtained. Ultrasound was performed to localize and mark an adequate pocket of fluid in the right chest. The area was then prepped and draped in the normal sterile fashion. 1% Lidocaine was used for local anesthesia. Under ultrasound guidance a 6 Fr Safe-T-Centesis catheter was introduced. Thoracentesis was performed. The catheter was removed and a dressing applied. FINDINGS: A total of approximately 500 mL of serosanguineous fluid was removed. Samples were sent to the laboratory as requested by the clinical team. IMPRESSION: Successful ultrasound guided therapeutic and diagnostic right-sided thoracentesis yielding 500 mL of pleural fluid. Read by: Anders Grant, NP Electronically Signed   By: Acquanetta Belling M.D.   On: 03/07/2023 08:01   DG Chest Port 1 View  Result Date: 03/06/2023 CLINICAL DATA:  Status post right thoracentesis. EXAM: PORTABLE CHEST - 1 VIEW COMPARISON:  03/05/2023 FINDINGS: Cardiomediastinal silhouette and pulmonary vasculature are within normal limits. No pneumothorax status post right thoracentesis. Moderate size effusion remains. Nodular opacity again noted in the left mid lung. IMPRESSION: No pneumothorax status post right thoracentesis. Electronically Signed   By: Acquanetta Belling M.D.   On: 03/06/2023 16:41   CT Chest W Contrast  Result Date: 03/05/2023 CLINICAL DATA:  Shortness of breath EXAM: CT CHEST WITH CONTRAST TECHNIQUE: Multidetector CT imaging of the chest was performed during intravenous contrast administration. RADIATION DOSE REDUCTION: This exam was performed according to the departmental dose-optimization program which includes  automated exposure control, adjustment of the mA and/or kV according to patient size and/or use of iterative reconstruction technique. CONTRAST:  80mL OMNIPAQUE IOHEXOL 350 MG/ML SOLN COMPARISON:  CT chest angio dated February 18, 2023 FINDINGS: Cardiovascular: Normal heart size. No pericardial effusion. Normal caliber thoracic aorta with moderate atherosclerotic disease. Moderate coronary artery calcifications. No suspicious filling defects of the central pulmonary arteries. Mediastinum/Nodes: Esophagus thyroid are unremarkable. Fluid collection of the left axilla with adjacent surgical clips measuring 7.1 x 1.6 cm on series 3, image 58. No enlarged lymph nodes seen in the chest. Lungs/Pleura: Central airways are patent. Increased large right pleural effusion causing complete collapse of the right lower and middle lobes and near-complete collapse of the right upper lobe. New trace left pleural effusion. Decreased nodular  opacity of the posterior left upper lobe measuring 2.6 x 1.3 cm, previously 3.2 x 1.7 cm. Similar patchy linear nodular opacities of the left upper lobe. Newnodule of the left hemithorax measuring 11 x 7 mm located along the oblique fissure on series 4, image 82, likely reactive pulmonary lymph node. New solid pulmonary nodule of the left lower lobe measuring 9 x 5 mm on series 4, image 103. Upper Abdomen: Partially visualized abdominal aortic aneurysm, better seen on prior. Musculoskeletal: No chest wall abnormality. Diffuse ankylosis of the thoracic spine. No aggressive appearing osseous lesions. IMPRESSION: 1. Large right pleural effusion causing complete collapse of the right lower and middle lobes and near-complete collapse of the right upper lobe. 2. New trace left pleural effusion. 3. Decreased nodular opacity of the posterior left upper lobe. Recommend short-term follow-up chest CT in 3 months to ensure resolution. 4. New solid pulmonary nodule of the left lower lobe, likely infectious or  inflammatory given rapid interval development. 5. New well-defined solid nodule located along the oblique left fissure, likely reactive pulmonary lymph node. Recommend attention on follow-up. 6. Similar patchy linear nodular opacities of the left upper lobe. Recommend attention on follow-up. 7. Fluid collection of the left axilla with adjacent surgical clips, likely a postsurgical seroma. 8. Aortic Atherosclerosis (ICD10-I70.0). Electronically Signed   By: Allegra Lai M.D.   On: 03/05/2023 16:28   DG Chest Portable 1 View  Result Date: 03/05/2023 CLINICAL DATA:  Shortness of breath EXAM: PORTABLE CHEST - 1 VIEW COMPARISON:  02/17/2023 FINDINGS: Cardiomediastinal silhouette and pulmonary vasculature are within normal limits. Interval development of large right pleural effusion. Additional patchy opacities also present in the left mid to lower lung. IMPRESSION: 1. Interval development of large right pleural effusion. 2. New patchy opacities in the left mid to lower lung suspicious for pneumonia. Electronically Signed   By: Acquanetta Belling M.D.   On: 03/05/2023 14:30    Cardiac Studies     Patient Profile     Erica Roeth is a 88 y.o. male with a hx of blindness, chronic pain on chronic opiods, COPD, HFpEF, depression/anxiety, HTN, traumatic C2 fracture (April 2024), melanoma of the right forearm, now metastatic to the left forearm s/p resection 8/29) with PICC line who is being seen 03/06/2023 for the evaluation of heart failure   Assessment & Plan    Acute respiratory failure with hypoxia, right pleural effusion Presenting with shortness of breath Underwent thoracentesis on right yesterday 500 out Recent echocardiogram with normal ejection fraction estimated 55 to 60% -Suspect less likely CHF given low fluid intake, renal dysfunction on arrival getting worse on diuretic -Already received Lasix this morning, will hold tomorrow's dose until lab work reviewed -Low albumin of concern as general  marker of poor nutrition  Metastatic melanoma Recent surgery on forearm  VT Noted on recent hospitalization on monitor unrelated to weakness which was primary admitting diagnosis -Was started on amiodarone, plan to continue 200 twice daily for now Will look to restart carvedilol 6.25 twice daily with titration up to 12.5 twice daily, his outpatient dose  Failure to thrive Notes indicating he is followed by hospice   Total encounter time more than 50 minutes  Greater than 50% was spent in counseling and coordination of care with the patient   For questions or updates, please contact [facility] Please consult [email or web address] for contact info under        Signed, Julien Nordmann, MD  03/07/2023, 12:33 PM

## 2023-03-07 NOTE — Hospital Course (Addendum)
Aaron Gibbs is a 88 y.o. 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   They presented from home to the ED on 03/05/2023 with acute SOB x 2-3 days. Was recently discharged from hospital stay on 9/5 after receiving care for ventricular tachycardia. No oxygen requirement at baseline.    In the ED, it was found that they had Tmax nine 9.2, heart rate 60s, respirations stable, blood pressure 100s to 150s over 70s. Satting in the mid 90s on 4 L.  Significant findings included White count 4.8, hemoglobin 10.1, platelets 253, creatinine 1.31.  Chest x-ray with interval development of large right pleural effusion. No patchy opacities in the left midlung and lower lung suspicious for pneumonia.   They were initially treated with amiodarone, lasix, CTX, doxycycline.    Patient was admitted to medicine service for further workup and management of acute hypoxic respiratory failure as outlined in detail below.  9/17 -- thoracentesis with 500 cc fluid removed 918 -- qualifies for home oxygen 2 L/min due to ambulatory desaturation to 87% on room air 9/20 -- Pleurx cathter placed, 1.2L serosanguinous fluid drained  Plan is for discharge home with hospice when medically stable / improved.

## 2023-03-07 NOTE — Progress Notes (Addendum)
Progress Note   Patient: Aaron Gibbs SAY:[id number] DOB: 04-10-1935 DOA: 03/05/2023     2 DOS: the patient was seen and examined on 03/07/2023   Brief hospital course: Aaron Gibbs is a 88 y.o. 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   They presented from home to the ED on 03/05/2023 with acute SOB x 2-3 days. Was recently discharged from hospital stay on 9/5 after receiving care for ventricular tachycardia. No oxygen requirement at baseline.    In the ED, it was found that they had Tmax nine 9.2, heart rate 60s, respirations stable, blood pressure 100s to 150s over 70s. Satting in the mid 90s on 4 L.  Significant findings included White count 4.8, hemoglobin 10.1, platelets 253, creatinine 1.31.  Chest x-ray with interval development of large right pleural effusion. No patchy opacities in the left midlung and lower lung suspicious for pneumonia.   They were initially treated with amiodarone, lasix, CTX, doxycycline.    Patient was admitted to medicine service for further workup and management of acute hypoxic respiratory failure as outlined in detail below.  9/17 -- thoracentesis with 500 cc fluid removed 918 -- qualifies for home oxygen 2 L/min due to ambulatory desaturation to 87% on room air  Plan is for discharge home with hospice when medically stable / improved.  Assessment and Plan:  Acute respiratory failure with hypoxia (HCC) Acute R- pleural effusion- only small effusion present on recent admission for Vtach so accumulated significant fluid since 8/31. Initially required 4L/min O2 by [place] without baseline oxygen support.  - IR consulted for thoracentesis. 9/17 removed 500cc. F/u chest xray neg for pneumothorax - follow pending pleural fluid cultures and cytology - continue Rocephin and Doxy - f/u cultures/labs - supplemental O2 to  keep spO2 > 90% - treated with IV diuresis - now on hold - Cardiology consulted, appreciate input   H/o ventricular tachycardia- appears to be well controlled this admission.  - cardiology consulted, appreciate your recs - continue amiodarone at 200mg  BID    Chronic heart failure with preserved ejection fraction [facility]) 2D echo September 2024 with EF 55 to 60%. Euvolemic  - given IV diurese, now on hold - mgmt per Cardiology  - Coreg resumed   COPD- does not appear to have significant reactive/inflammatory component to respiratory decline. No wheezing on exam. No cough or increased sputum production. Is on empiric Abx - DuoNebs PRN    Infrarenal abdominal aortic aneurysm (AAA) without rupture (HCC) preoperative clearance for endovascular repair as per vasc surg. Last aneurysm size was 5 cm    HLD (hyperlipidemia) Continue statin   GERD (gastroesophageal reflux disease) PPI   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 [facility]) s/p resection 02/15/2023.  Continue on doxycycline.  Management per oncology   Obesity Body mass index is 31.98 kg/m. Complicates overall care and prognosis.  Recommend lifestyle modifications including physical activity and diet for weight loss and overall long-term health.   Advanced Care Planning - Palliative care consulted, appreciate GOC assistance. Plan is d/c home with hospice.      Subjective: Pt seen seated edge of bed, caregiver at bedside. He reports ongoing neck pain, feels not well controlled on meds.  Caregiver reports he seems comfortable when meds take effect.  He thought he was getting fluid removed from chest today, did not recall that was done yesterday.  No other acute complaints or events reported.  Physical Exam: Vitals:   03/06/23 2226 03/07/23 0500 03/07/23 0523 03/07/23 0838  BP: (!) 142/53  (!) 122/43 137/68  Pulse: 80  60 66  Resp: 20  18 16   Temp: 97.9 F (36.6 C)  (!) 97.5  F (36.4 C) 97.6 F (36.4 C)  TempSrc: Oral  Oral Oral  SpO2: 94%  98% 100%  Weight: 95.7 kg 95.6 kg    Height: 5\' 10"  (1.778 m)      General exam: awake, alert, no acute distress, chronically ill appearing, obese HEENT: moist mucus membranes, hearing grossly normal  Respiratory system: CTAB diminished bases R> L, no wheezes, normal respiratory effort at rest on 2 L/min [place] O2. Cardiovascular system: normal S1/S2, RRR  Gastrointestinal system: soft, NT, ND, no HSM felt, +bowel sounds. Central nervous system: A&O xself and hospital at least. no gross focal neurologic deficits, normal speech Extremities: left forearm dressing clean dry intact with edema proximally but normal temp & intact sensation distally Skin: dry, intact, normal temperature Psychiatry: normal mood, congruent affect, abnormal judgement and insight    Data Reviewed:  Notable labs --- glucose 112, BUN 27, Cr 1.42 from 1.44 stable, albumin 2.9, Hbg 9.5   Family Communication: caregiver at beside on rounds. Will attempt to call daughter.  Disposition: Status is: Inpatient Remains inpatient appropriate because: severity of illness, closely monitoring for stability prior to d/c home with hospice, anticipate in next 24-48 hours   Planned Discharge Destination:  Home with hospice    Time spent: 45 minutes  Author: Pennie Banter, DO 03/07/2023 4:10 PM  For on call review [email or web address].

## 2023-03-07 NOTE — Progress Notes (Signed)
Pt has not voided this shift, attempted to have pt try to void and bladder scan, he refuses at this time, states that there is nothing wrong with his bladder.

## 2023-03-07 NOTE — TOC Initial Note (Signed)
Transition of Care [facility]) - Initial/Assessment Note    Patient Details  Name: Aaron Gibbs MRN: [id number] Date of Birth: 13-Mar-1935  Transition of Care [facility]) CM/SW Contact:    Darolyn Rua, LCSW Phone Number: 03/07/2023, 9:15 AM  Clinical Narrative:                  CSW spoke with patient's daughter Aaron Gibbs who reports that goal is for patient to go home iwht hospice, however last admission when they got home she became worried about continuing hospice due to concerns that patient's neck fracture and arm wounds from cancer would not continue to be treated.   CSW spoke with kara with hospice who confirms they can continue to treat the treatable for patient while he's in hospice services, Diannia Ruder to call daughter and provide education.   At  this time, discharge plan is home with [facility] hospice services.   Daughter Aaron Gibbs reports patient also has a 24/7 live in caregiver.   TOC will continue to follow for needs.   Expected Discharge Plan: Home w Hospice Care     Patient Goals and CMS Choice            Expected Discharge Plan and Services                                              Prior Living Arrangements/Services                       Activities of Daily Living      Permission Sought/Granted                  Emotional Assessment              Admission diagnosis:  Shortness of breath [R06.02] Pleural effusion [J90] Acute respiratory failure with hypoxia (HCC) [J96.01] Patient Active Problem List   Diagnosis Date Noted   Shortness of breath 03/06/2023   Status post thoracentesis 03/06/2023   Acute respiratory failure with hypoxia (HCC) 03/05/2023   Pleural effusion 03/05/2023   Ventricular tachycardia (HCC) 02/17/2023   Hypotension 02/17/2023   S/P PICC central line placement 02/17/2023   Ankylosing spondylitis lumbar region (HCC) 12/27/2022   Traumatic closed fracture of C2 vertebra with minimal displacement, sequela  12/27/2022   Cervicalgia 12/27/2022   Decreased functional activity tolerance 12/15/2022   Lymphedema of arm 12/15/2022   Melanoma in situ of trunk (HCC) 12/15/2022   COPD (chronic obstructive pulmonary disease) (HCC) 11/09/2022   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 (HCC) 11/09/2022   DNR (do not resuscitate) 10/17/2022   AKI (acute kidney injury) (HCC) 10/17/2022   C2 cervical fracture (HCC) 10/17/2022   Infrarenal abdominal aortic aneurysm (AAA) without rupture (HCC) 10/17/2022   Hypokalemia 10/17/2022   COVID-19 10/11/2022   Thrombocytopenia (HCC) 10/11/2022   Chronic heart failure with preserved ejection fraction (HCC) 09/13/2022   Palliative care patient 07/17/2022   Anemia 06/09/2022   Unable to maintain body in lying position 05/24/2022   Benign paroxysmal positional vertigo    Generalized weakness 01/29/2022   Sensorineural hearing loss (SNHL) of both ears 07/25/2021   Pain in right knee 03/15/2021   Other chronic pain 03/15/2021   Chronic knee pain (Left) 09/28/2020   Abnormal MRI, knee (08/05/2018) (Left) 09/13/2020   Long-term current use of  benzodiazepine 08/09/2020   Opioid dependence, binge pattern (HCC) 08/09/2020   Opioid dependence with or without physiological dependence (HCC) 08/08/2020   History of substance use disorder 04/15/2020   Osteoarthritis of knee (Left) 03/23/2020   Aortic atherosclerosis (HCC) 02/05/2020   Generalized anxiety disorder with panic attacks 02/04/2020   Depression with anxiety 12/13/2019   Depression 12/12/2019   HLD (hyperlipidemia)    Iron deficiency anemia    Fall at home, initial encounter    Medial meniscal tear, sequela (Left) 06/10/2019   Patellar tendinosis (Right) 06/10/2019   Lateral meniscal tear, sequela (Left) 03/03/2019   Palpitation 02/13/2019   Preop testing 11/14/2018   Noncompliance with medication treatment due to overuse of medication 10/23/2018    Neurogenic pain 08/20/2018   Atherosclerotic peripheral vascular disease (HCC) 07/24/2018   Tricompartment osteoarthritis of knee (Left) 07/24/2018   Osteoarthritis of knee (Bilateral) 07/24/2018   Osteoarthritis of patellofemoral joint (Right) 07/24/2018   History of suicide attempt (06/12/18) 07/24/2018   Long term current use of opiate analgesic 07/10/2018   Pharmacologic therapy 07/10/2018   Disorder of skeletal system 07/10/2018   Problems influencing health status 07/10/2018   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   Major depressive disorder, recurrent episode, severe (HCC) 06/04/2018   Blindness 05/10/2018   Chronic pain syndrome 05/01/2018   DISH (diffuse idiopathic skeletal hyperostosis) 05/01/2018   Osteoarthritis of multiple joints 05/01/2018   Chronic knee pain (1ry area of Pain) (Bilateral) (L>R) 05/01/2018   Retinitis pigmentosa of both eyes 05/01/2018   Glaucoma of both eyes 05/01/2018   Essential hypertension 05/01/2018   Chronic low back pain (Bilateral) w/o sciatica 05/01/2018   GERD (gastroesophageal reflux disease) 05/01/2018   AVM (arteriovenous malformation) of colon 05/01/2018   Therapeutic opioid-induced constipation (OIC) 05/01/2018   PCP:  Smitty Cords, DO Pharmacy:   [facility] [phone] [place], [place] - [address] AT [facility] [address] [place] [address] [place] [address] Phone: [phone] Fax: [phone]     Social Determinants of Health (SDOH) Social History: SDOH Screenings   Food Insecurity: No Food Insecurity (02/18/2023)  Housing: Low Risk  (02/18/2023)  Transportation Needs: No Transportation Needs (02/18/2023)  Utilities: Not At Risk (02/18/2023)  Alcohol Screen: Low Risk  (02/02/2023)  Depression (PHQ2-9): Low Risk  (02/02/2023)  Recent Concern: Depression (PHQ2-9) - Medium Risk (12/27/2022)  Financial Resource Strain: Low Risk  (07/13/2022)  Physical Activity: Inactive (07/13/2022)  Social Connections:  Socially Isolated (07/13/2022)  Stress: Stress Concern Present (07/13/2022)  Tobacco Use: Medium Risk (03/05/2023)   SDOH Interventions:     Readmission Risk Interventions    02/20/2023    9:47 AM  Readmission Risk Prevention Plan  Transportation Screening Complete  Medication Review (RN Care Manager) Referral to Pharmacy  PCP or Specialist appointment within 3-5 days of discharge Complete  HRI or Home Care Consult Complete  SW Recovery Care/Counseling Consult Complete  Palliative Care Screening Not Applicable  Skilled Nursing Facility Not Applicable

## 2023-03-07 NOTE — Progress Notes (Signed)
[facility] COLLECTIVE NEW REFERRAL NOTE   Received request from  Darolyn Rua, SW, Transitions of Care Manager, for hospice services at home after discharge. Spoke with Laurette Schimke, patient's daughter, to initiate education related to hospice philosophy, services, and team approach to care. Patient/family verbalized understanding of information given. Per discussion, the plan is for hospice services upon discharge from the hospital.  DME needs discussed. Patient has the following equipment in the home:  [facility], wheelchair, shower chair, walker  Patient/family requests the following equipment for delivery:  none.  [name] states her dad refuses to wear oxygen and she request it not be put in home.  She understands we can order if she changes her mind.         The address has been verified and is correct in the chart.   Please send signed and completed DNR home with patient/family if applicable. Please provide prescriptions at discharge as needed to ensure ongoing symptom management.  [facility] information and contact numbers given to [employer].  Above information shared with medical care team.  Will continue to collaborate with hospital medical care team through final disposition.  Please call with any hospice related questions or concerns. Thank you for the opportunity to participate in this patient's care.  Norris Cross, RN Nurse Liaison [phone]

## 2023-03-07 NOTE — Evaluation (Addendum)
Physical Therapy Evaluation Patient Details Name: Aaron Gibbs MRN: [id number] DOB: Oct 23, 1934 Today's Date: 03/07/2023  History of Present Illness  Aaron Gibbs is a 88 y.o. 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 They presented from home to the ED on 03/05/2023 with acute SOB x 2-3 days. Was recently discharged from hospital stay on 9/5 after receiving care for ventricular tachycardia. No oxygen requirement at baseline.  Clinical Impression   Pt presents seated EOB, some pain in neck. He currently lives alone in a ground level apartment and 24/7 care attendant. Per caregiver, he was receiving assistance with transfers to toilet, minimally walking, and assisting with all ADLs due to vision deficits.   PT/OT cotreat performed to maximize functional abilities and due to decreased activity tolerance. Pt performed sit<>stand with CGA/RW and ambulated 8+9ft with CGA/RW and therapist facilitation of RW due to vision deficits. PT noting increased fatigue/weakness at end of ambulation and began sitting before at EOB, requiring brief min-modA to ensure safe return to EOB. Pt noted 8/10 RPE following mobility. Pt overall at baseline level of functioning and is receiving necessary assistance at home. PT to discharge at this time and to re-evaluate if decline in functional status occurs.   Refer to general comments for SPO2 response to activity.       If plan is discharge home, recommend the following: A little help with walking and/or transfers;A little help with bathing/dressing/bathroom;Direct supervision/assist for medications management;Assist for transportation;Assistance with cooking/housework   Can travel by private vehicle        Equipment Recommendations None recommended by PT  Recommendations for Other Services       Functional Status Assessment  Patient has not had a recent decline in their functional status     Precautions / Restrictions Precautions Precautions: Fall Precaution Comments: blind Restrictions Weight Bearing Restrictions: No      Mobility  Bed Mobility               General bed mobility comments: not observed during sesison    Transfers Overall transfer level: Needs assistance Equipment used: Rolling walker (2 wheels) Transfers: Sit to/from Stand Sit to Stand: Contact guard assist                Ambulation/Gait Ambulation/Gait assistance: Contact guard assist Gait Distance (Feet): 20 Feet (8+12) Assistive device: Rolling walker (2 wheels) Gait Pattern/deviations: Trunk flexed, Decreased step length - right, Decreased step length - left Gait velocity: decreased     General Gait Details: needs therapist facilitation of RW for direction 2/2 blindness, 8/10 RPE following ambulation  Stairs            Wheelchair Mobility     Tilt Bed    Modified Rankin (Stroke Patients Only)       Balance Overall balance assessment: Needs assistance Sitting-balance support: No upper extremity supported, Feet supported Sitting balance-Leahy Scale: Good     Standing balance support: Bilateral upper extremity supported Standing balance-Leahy Scale: Fair                               Pertinent Vitals/Pain Pain Assessment Pain Assessment: Faces Faces Pain Scale: Hurts little more Pain Location: neck Pain Descriptors / Indicators: Grimacing, Discomfort Pain Intervention(s): Limited activity within patient's tolerance, RN gave pain meds during session, Monitored during session    Home Living  Family/patient expects to be discharged to:: Private residence Living Arrangements: Alone Available Help at Discharge: Personal care attendant;Available 24 hours/day Type of Home: House         Home Layout: One level Home Equipment: BSC/3in1;Wheelchair - manual;Standard  Buyer, retail Comments: caregiver verified information    Prior Function Prior Level of Function : Needs assist             Mobility Comments: caregiver assists with w/c transfer to toilet; ambulatory prior to recent admission but minimal walking in last 2 weeks ADLs Comments: caregiver assists with all aspects due to low vision     Extremity/Trunk Assessment   Upper Extremity Assessment Upper Extremity Assessment: Overall WFL for tasks assessed    Lower Extremity Assessment Lower Extremity Assessment: Generalized weakness       Communication   Communication Communication: No apparent difficulties Cueing Techniques: Verbal cues;Tactile cues  Cognition Arousal: Alert Behavior During Therapy: WFL for tasks assessed/performed Overall Cognitive Status: Within Functional Limits for tasks assessed                                          General Comments General comments (skin integrity, edema, etc.): SPO2 >90% on 4L O2, when on RA SPO2 decreased to 87% following ambulation    Exercises     Assessment/Plan    PT Assessment Patient does not need any further PT services  PT Problem List         PT Treatment Interventions      PT Goals (Current goals can be found in the Care Plan section)       Frequency       Co-evaluation PT/OT/SLP Co-Evaluation/Treatment: Yes Reason for Co-Treatment: For patient/therapist safety;To address functional/ADL transfers PT goals addressed during session: Mobility/safety with mobility OT goals addressed during session: ADL's and self-care       AM-PAC PT "6 Clicks" Mobility  Outcome Measure Help needed turning from your back to your side while in a flat bed without using bedrails?: A Little Help needed moving from lying on your back to sitting on the side of a flat bed without using bedrails?: A Little Help needed moving to and from a bed to a chair (including a wheelchair)?: A Little Help needed standing  up from a chair using your arms (e.g., wheelchair or bedside chair)?: A Little Help needed to walk in hospital room?: A Little Help needed climbing 3-5 steps with a railing? : A Lot 6 Click Score: 17    End of Session Equipment Utilized During Treatment: Oxygen Activity Tolerance: Patient tolerated treatment well;Patient limited by fatigue Patient left: in bed;with call bell/phone within reach;with family/visitor present        Time: 0938-1000 PT Time Calculation (min) (ACUTE ONLY): 22 min   Charges:   PT Evaluation $PT Eval Low Complexity: 1 Low   PT General Charges $$ ACUTE PT VISIT: 1 Visit       Jacy Brocker, PT, SPT 12:34 PM,03/07/23

## 2023-03-07 NOTE — Evaluation (Addendum)
Occupational Therapy Evaluation Patient Details Name: Aaron Gibbs MRN: [id number] DOB: [birth year] Today's Date: 03/07/2023   History of Present Illness Aaron Gibbs is a 88 y.o. 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 They presented from home to the ED on 03/05/2023 with acute SOB x 2-3 days. Was recently discharged from hospital stay on 9/5 after receiving care for ventricular tachycardia. No oxygen requirement at baseline.   Clinical Impression   Aaron Gibbs was seen for OT/PT co-evaluation this date. Prior to hospital admission, pt required assist for all ADLs from caregiver 2/2 low vision and using w/c for mobility following recent hospitalization ~2 weeks ago. Pt lives with 24/7 caregiver. Pt currently performing near baseline for functional mobility and ADLs per caregiver at bedside. Requires SETUP for seated grooming tasks. CGA + RW for ADL t/f, tolerates ~10 ft x2 trials - assist for RW mgmt 2/2 low vision. No DME or acute OT needs, will sign off. Upon hospital discharge, recommend no OT follow up.   SaO2 on room air at rest = 93% SaO2 on room air while ambulating = 87% SaO2 on 2 liters of O2 while ambulating = 93%     If plan is discharge home, recommend the following: A little help with walking and/or transfers;A little help with bathing/dressing/bathroom    Functional Status Assessment  Patient has had a recent decline in their functional status and demonstrates the ability to make significant improvements in function in a reasonable and predictable amount of time.  Equipment Recommendations  None recommended by OT    Recommendations for Other Services       Precautions / Restrictions Precautions Precautions: Fall Precaution Comments: blind Restrictions Weight Bearing Restrictions: No      Mobility Bed Mobility                General bed mobility comments: not tested    Transfers Overall transfer level: Needs assistance Equipment used: Rolling walker (2 wheels) Transfers: Sit to/from Stand Sit to Stand: Contact guard assist                  Balance Overall balance assessment: Needs assistance Sitting-balance support: No upper extremity supported, Feet supported Sitting balance-Leahy Scale: Good     Standing balance support: Bilateral upper extremity supported Standing balance-Leahy Scale: Fair                             ADL either performed or assessed with clinical judgement   ADL Overall ADL's : Needs assistance/impaired                                       General ADL Comments: SETUP for seated grooming tasks. MIN A + RW for simulated toilet t/f - assist for RW mgmt 2/2 low vision      Pertinent Vitals/Pain Pain Assessment Pain Assessment: Faces Faces Pain Scale: Hurts little more Pain Location: neck Pain Descriptors / Indicators: Grimacing, Dull Pain Intervention(s): Limited activity within patient's tolerance, RN gave pain meds during session     Extremity/Trunk Assessment Upper Extremity Assessment Upper Extremity Assessment: Overall WFL for tasks assessed   Lower Extremity Assessment Lower Extremity Assessment: Generalized weakness       Communication Communication Communication: No apparent difficulties Cueing Techniques:  Verbal cues;Tactile cues   Cognition Arousal: Alert Behavior During Therapy: WFL for tasks assessed/performed Overall Cognitive Status: Within Functional Limits for tasks assessed                                       General Comments  SpO2 desat 87% ambulating on RA            Home Living Family/patient expects to be discharged to:: Private residence Living Arrangements: Alone Available Help at Discharge: Personal care attendant;Available 24 hours/day Type of Home: House       Home  Layout: One level     Bathroom Shower/Tub: Walk-in shower         Home Equipment: BSC/3in1;Wheelchair - Therapist, sports   Additional Comments: caregiver verified information      Prior Functioning/Environment Prior Level of Function : Needs assist             Mobility Comments: caregiver assists with w/c transfer to toilet; ambulatory prior to recent admission but minimal walking in last 2 weeks ADLs Comments: caregiver assists with all aspects due to low vision        OT Problem List: Decreased strength;Decreased range of motion;Decreased activity tolerance;Impaired balance (sitting and/or standing);Decreased safety awareness         OT Goals(Current goals can be found in the care plan section) Acute Rehab OT Goals Patient Stated Goal: to go home OT Goal Formulation: With patient/family Time For Goal Achievement: 04/13/23 Potential to Achieve Goals: Good   AM-PAC OT "6 Clicks" Daily Activity     Outcome Measure Help from another person eating meals?: None Help from another person taking care of personal grooming?: A Little Help from another person toileting, which includes using toliet, bedpan, or urinal?: A Little Help from another person bathing (including washing, rinsing, drying)?: A Little Help from another person to put on and taking off regular upper body clothing?: A Little Help from another person to put on and taking off regular lower body clothing?: A Lot 6 Click Score: 18   End of Session Equipment Utilized During Treatment: Rolling walker (2 wheels) Nurse Communication: Mobility status  Activity Tolerance: Patient tolerated treatment well Patient left: in bed;with call bell/phone within reach;with family/visitor present  OT Visit Diagnosis: Other abnormalities of gait and mobility (R26.89);Muscle weakness (generalized) (M62.81)                Time: 4540-9811 OT Time Calculation (min): 23 min Charges:  OT General Charges $OT Visit: 1  Visit OT Evaluation $OT Eval Moderate Complexity: 1 Mod  Kathie Dike, M.S. OTR/L  03/07/23, 10:11 AM  ascom [phone]

## 2023-03-08 ENCOUNTER — Inpatient Hospital Stay: Payer: Medicare Other

## 2023-03-08 DIAGNOSIS — C779 Secondary and unspecified malignant neoplasm of lymph node, unspecified: Secondary | ICD-10-CM | POA: Diagnosis not present

## 2023-03-08 DIAGNOSIS — J9601 Acute respiratory failure with hypoxia: Secondary | ICD-10-CM | POA: Diagnosis not present

## 2023-03-08 DIAGNOSIS — Z9889 Other specified postprocedural states: Secondary | ICD-10-CM | POA: Diagnosis not present

## 2023-03-08 DIAGNOSIS — I5032 Chronic diastolic (congestive) heart failure: Secondary | ICD-10-CM | POA: Diagnosis not present

## 2023-03-08 DIAGNOSIS — I472 Ventricular tachycardia, unspecified: Secondary | ICD-10-CM

## 2023-03-08 DIAGNOSIS — J90 Pleural effusion, not elsewhere classified: Secondary | ICD-10-CM | POA: Diagnosis not present

## 2023-03-08 LAB — BASIC METABOLIC PANEL
Anion gap: 8 (ref 5–15)
BUN: 29 mg/dL — ABNORMAL HIGH (ref 8–23)
CO2: 29 mmol/L (ref 22–32)
Calcium: 8.7 mg/dL — ABNORMAL LOW (ref 8.9–10.3)
Chloride: 100 mmol/L (ref 98–111)
Creatinine, Ser: 1.28 mg/dL — ABNORMAL HIGH (ref 0.61–1.24)
GFR, Estimated: 55 mL/min — ABNORMAL LOW (ref >60)
Glucose, Bld: 120 mg/dL — ABNORMAL HIGH (ref 70–99)
Potassium: 4.3 mmol/L (ref 3.5–5.1)
Sodium: 137 mmol/L (ref 135–145)

## 2023-03-08 MED ORDER — CARVEDILOL 12.5 MG PO TABS
12.5000 mg | ORAL_TABLET | Freq: Two times a day (BID) | ORAL | Status: DC
  Administered 2023-03-08 – 2023-03-10 (×4): 12.5 mg via ORAL
  Filled 2023-03-08 (×4): qty 1

## 2023-03-08 MED ORDER — LORAZEPAM 1 MG PO TABS
1.0000 mg | ORAL_TABLET | Freq: Three times a day (TID) | ORAL | Status: DC | PRN
  Administered 2023-03-08 – 2023-03-10 (×6): 1 mg via ORAL
  Filled 2023-03-08 (×6): qty 1

## 2023-03-08 MED ORDER — MORPHINE SULFATE (PF) 2 MG/ML IV SOLN
1.0000 mg | INTRAVENOUS | Status: DC | PRN
  Administered 2023-03-08: 2 mg via INTRAVENOUS
  Filled 2023-03-08: qty 1

## 2023-03-08 MED ORDER — MORPHINE SULFATE (PF) 2 MG/ML IV SOLN
1.0000 mg | INTRAVENOUS | Status: DC | PRN
  Administered 2023-03-08 – 2023-03-10 (×6): 2 mg via INTRAVENOUS
  Filled 2023-03-08 (×7): qty 1

## 2023-03-08 NOTE — Progress Notes (Signed)
Progress Note   Patient: Aaron Gibbs WUJ:[id number] DOB: [birth year] DOA: 03/05/2023     3 DOS: the patient was seen and examined on 03/08/2023   Brief hospital course: Aaron Gibbs is a 88 y.o. 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   They presented from home to the ED on 03/05/2023 with acute SOB x 2-3 days. Was recently discharged from hospital stay on 9/5 after receiving care for ventricular tachycardia. No oxygen requirement at baseline.    In the ED, it was found that they had Tmax nine 9.2, heart rate 60s, respirations stable, blood pressure 100s to 150s over 70s. Satting in the mid 90s on 4 L.  Significant findings included White count 4.8, hemoglobin 10.1, platelets 253, creatinine 1.31.  Chest x-ray with interval development of large right pleural effusion. No patchy opacities in the left midlung and lower lung suspicious for pneumonia.   They were initially treated with amiodarone, lasix, CTX, doxycycline.    Patient was admitted to medicine service for further workup and management of acute hypoxic respiratory failure as outlined in detail below.  9/17 -- thoracentesis with 500 cc fluid removed 918 -- qualifies for home oxygen 2 L/min due to ambulatory desaturation to 87% on room air  Plan is for discharge home with hospice when medically stable / improved.  Assessment and Plan:  Acute respiratory failure with hypoxia (HCC) Acute R- pleural effusion, likely malignant effusion - only small effusion present on recent admission for Vtach so accumulated significant fluid since 8/31. Initially required 4L/min O2 by [place] without baseline oxygen support.  IR consulted for thoracentesis -- 9/17 removed 500cc. F/u chest xray neg for pneumothorax. Pathologist smear notes atypical cells present in pleural fluid. - follow pending pleural fluid  cultures and cytology - continue Rocephin and Doxy - f/u cultures/labs - supplemental O2 to keep spO2 > 90% - treated with IV diuresis - now on hold - Cardiology consulted, appreciate input - 9/19 - IR consult for placement of Pleurx catheter as palliative measure, given re-accumulation is expected and pt still very symptomatic post thoracentesis 2 days ago   H/o ventricular tachycardia- appears to be well controlled this admission.  - cardiology consulted, appreciate your recs - continue amiodarone at 200mg  BID    Chronic heart failure with preserved ejection fraction [facility]) 2D echo September 2024 with EF 55 to 60%. Euvolemic  - given IV diuresis, now on hold - mgmt per Cardiology  - Coreg resumed  Traumatic closed fracture of C2 vertebra: with minimal displacement, sequela.  Continue on c-collar.  Pain control prn    COPD- does not appear to have significant reactive/inflammatory component to respiratory decline. No wheezing on exam. No cough or increased sputum production. Is on empiric Abx - DuoNebs PRN    Infrarenal abdominal aortic aneurysm (AAA) without rupture (HCC) preoperative clearance for endovascular repair as per vasc surg. 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  Continue on doxycycline.  Management per oncology   Obesity Body mass index is 31.98 kg/m. Complicates overall care and prognosis.  Recommend lifestyle modifications including physical activity and diet for weight loss and overall long-term health.   Advanced Care Planning - Palliative care consulted, appreciate GOC assistance. Plan  is d/c home with hospice.      Subjective: Pt seen seated edge of bed today.  He reports ongoing shortness of breath.  Also feeling quite anxious.  Low dose ativan not  very helpful that he can tell.    Physical Exam: Vitals:   03/08/23 0431 03/08/23 0745 03/08/23 1300 03/08/23 1411  BP: (!) 128/49 (!) 151/75 (!) 163/63 (!) 166/94  Pulse: 61 70 72 77  Resp: 18 18 18  (!) 25  Temp: 98.3 F (36.8 C) 98.1 F (36.7 C) 98.1 F (36.7 C) (!) 97.3 F (36.3 C)  TempSrc:   Oral Axillary  SpO2: 97% 96% 97% 96%  Weight:      Height:       General exam: awake, alert, no acute distress, chronically ill appearing, obese HEENT: moist mucus membranes, hearing grossly normal  Respiratory system: CTAB diminished right base to midlung zone, no wheezes, mildly increased respiratory effort at rest, on 2 L/min [place] O2. Cardiovascular system: normal S1/S2, RRR  Gastrointestinal system: soft, NT, ND, no HSM felt, +bowel sounds. Central nervous system: A&O xself and hospital at least. no gross focal neurologic deficits, normal speech Extremities: left forearm dressing clean dry intact with improving edema proximally Skin: dry, intact, normal temperature Psychiatry: anxious mood, congruent affect, abnormal judgement and insight    Data Reviewed:  Notable labs --- glucose 120, BUN 29, Cr 1.42 >> 1.28 improved   Family Communication: spoke to daughter by phone & updated 9/18, 9/19   Disposition: Status is: Inpatient Remains inpatient appropriate because: severity of illness, closely monitoring for stability prior to d/c home with hospice, anticipate in next 24-48 hours   Planned Discharge Destination:  Home with hospice    Time spent: 45 minutes  Author: Pennie Banter, DO 03/08/2023 4:42 PM  For on call review [email or web address].

## 2023-03-08 NOTE — Progress Notes (Signed)
Sent urine specimen to lab.   Madie Reno, RN

## 2023-03-08 NOTE — Progress Notes (Signed)
Patient demonstrating increased WOB and anxiety. Patient given PRN Ativan. MD updated. Will continue to monitor.    03/08/23 1300  Vitals  Temp 98.1 F (36.7 C)  Temp Source Oral  BP (!) 163/63  MAP (mmHg) 96  BP Location Right Arm  BP Method Automatic  Patient Position (if appropriate) Sitting  Pulse Rate 72  Pulse Rate Source Dinamap  Resp 18  Level of Consciousness  Level of Consciousness Alert  MEWS COLOR  MEWS Score Color Green  Oxygen Therapy  SpO2 97 %  O2 Device Nasal Cannula  O2 Flow Rate (L/min) 3 L/min  Patient Activity (if Appropriate) In bed  Pulse Oximetry Type Intermittent  Pain Assessment  Pain Scale 0-10  Pain Score 0  MEWS Score  MEWS Temp 0  MEWS Systolic 0  MEWS Pulse 0  MEWS RR 0  MEWS LOC 0  MEWS Score 0

## 2023-03-08 NOTE — Plan of Care (Signed)
  Problem: Education: Goal: Knowledge of condition and prescribed therapy will improve Outcome: Not Progressing   Problem: Activity: Goal: Ability to tolerate increased activity will improve Outcome: Not Progressing   Problem: Coping: Goal: Level of anxiety will decrease Outcome: Not Progressing   Problem: Pain Managment: Goal: General experience of comfort will improve Outcome: Not Progressing   Problem: Safety: Goal: Ability to remain free from injury will improve Outcome: Not Progressing   Problem: Coping: Goal: Level of anxiety will decrease Outcome: Not Progressing   Problem: Pain Managment: Goal: General experience of comfort will improve Outcome: Not Progressing   Problem: Safety: Goal: Ability to remain free from injury will improve Outcome: Not Progressing

## 2023-03-08 NOTE — Plan of Care (Signed)
  Problem: Clinical Measurements: Goal: Respiratory complications will improve Outcome: Progressing   Problem: Safety: Goal: Ability to remain free from injury will improve Outcome: Progressing   Problem: Coping: Goal: Level of anxiety will decrease Outcome: Not Progressing

## 2023-03-08 NOTE — Progress Notes (Addendum)
[facility]- Advertising account executive with patient's daughter today and she is agreeable to having home 02 set up.  ETA is 3pm today. Patient to have pleurx catheter placed tomorrow.  Please don't hesitate to call with any Hospice related questions or concerns.    Thank you for the opportunity to participate in this patient's care. [facility] Liaison [phone]

## 2023-03-08 NOTE — Progress Notes (Addendum)
Patient states he is have difficulty breathing. RN observed patient to be belly breathing and anxious. CXR obtained. MD updated. PRN Morphine given. Will continue to monitor.   Aaron Reno, RN    03/08/23 1411  Vitals  Temp (!) 97.3 F (36.3 C)  Temp Source Axillary  BP (!) 166/94  MAP (mmHg) 96  BP Location Right Arm  BP Method Automatic  Patient Position (if appropriate) Sitting  Pulse Rate 77  Pulse Rate Source Dinamap  Resp (!) 25  Level of Consciousness  Level of Consciousness Alert  MEWS COLOR  MEWS Score Color Green  Oxygen Therapy  SpO2 96 %  O2 Device Nasal Cannula  O2 Flow Rate (L/min) 3 L/min  Patient Activity (if Appropriate) In bed  Pulse Oximetry Type Intermittent  MEWS Score  MEWS Temp 0  MEWS Systolic 0  MEWS Pulse 0  MEWS RR 1  MEWS LOC 0  MEWS Score 1

## 2023-03-08 NOTE — Care Management Important Message (Signed)
Important Message  Patient Details  Name: Aaron Gibbs MRN: [id number] Date of Birth: 03-05-1935   Medicare Important Message Given:  Other (see comment)  Disposition to discharge with hospice services.  Medicare IM withheld at this time out of respect for patient and family.   Johnell Comings 03/08/2023, 11:46 AM

## 2023-03-08 NOTE — TOC Progression Note (Signed)
Transition of Care [facility]) - Progression Note    Patient Details  Name: Aaron Gibbs MRN: [id number] Date of Birth: Oct 22, 1934  Transition of Care [facility]) CM/SW Contact  Chapman Fitch, RN Phone Number: 03/08/2023, 3:29 PM  Clinical Narrative:     Per [name] with [facility] home o2 will be arranged at the home today  Patient now under consideration for pleurx cath placement   Expected Discharge Plan: Home w Hospice Care    Expected Discharge Plan and Services                                               Social Determinants of Health (SDOH) Interventions SDOH Screenings   Food Insecurity: No Food Insecurity (02/18/2023)  Housing: Low Risk  (02/18/2023)  Transportation Needs: No Transportation Needs (02/18/2023)  Utilities: Not At Risk (02/18/2023)  Alcohol Screen: Low Risk  (02/02/2023)  Depression (PHQ2-9): Low Risk  (02/02/2023)  Recent Concern: Depression (PHQ2-9) - Medium Risk (12/27/2022)  Financial Resource Strain: Low Risk  (07/13/2022)  Physical Activity: Inactive (07/13/2022)  Social Connections: Socially Isolated (07/13/2022)  Stress: Stress Concern Present (07/13/2022)  Tobacco Use: Medium Risk (03/05/2023)    Readmission Risk Interventions    02/20/2023    9:47 AM  Readmission Risk Prevention Plan  Transportation Screening Complete  Medication Review (RN Care Manager) Referral to Pharmacy  PCP or Specialist appointment within 3-5 days of discharge Complete  HRI or Home Care Consult Complete  SW Recovery Care/Counseling Consult Complete  Palliative Care Screening Not Applicable  Skilled Nursing Facility Not Applicable

## 2023-03-08 NOTE — Progress Notes (Addendum)
Rounding Note    Patient Name: Aaron Gibbs Date of Encounter: 03/08/2023  [facility] Cardiologist: Wilkes  Subjective   Rounds was sitting on the side of the bed, reported having shortness of breath today worse than yesterday Reports he slept okay with no significant symptoms  Inpatient Medications    Scheduled Meds:  amiodarone  200 mg Oral BID   vitamin C  500 mg Oral BID   carvedilol  6.25 mg Oral BID WC   dorzolamide-timolol  1 drop Both Eyes BID   feeding supplement  237 mL Oral TID BM   finasteride  5 mg Oral Daily   gabapentin  300 mg Oral BID   multivitamin with minerals  1 tablet Oral Daily   mupirocin ointment  1 Application Nasal BID   OLANZapine  2.5 mg Oral BID   pantoprazole (PROTONIX) IV  40 mg Intravenous Q12H   predniSONE  40 mg Oral Q breakfast   rosuvastatin  5 mg Oral QPM   tamsulosin  0.4 mg Oral Daily   Continuous Infusions:  sodium chloride Stopped (03/06/23 1930)   cefTRIAXone (ROCEPHIN)  IV Stopped (03/07/23 1611)   doxycycline (VIBRAMYCIN) IV Stopped (03/08/23 0910)   PRN Meds: sodium chloride, acetaminophen, HYDROcodone-acetaminophen, LORazepam, morphine injection, ondansetron **OR** ondansetron (ZOFRAN) IV   Vital Signs    Vitals:   03/08/23 0431 03/08/23 0745 03/08/23 1300 03/08/23 1411  BP: (!) 128/49 (!) 151/75 (!) 163/63 (!) 166/94  Pulse: 61 70 72 77  Resp: 18 18 18  (!) 25  Temp: 98.3 F (36.8 C) 98.1 F (36.7 C) 98.1 F (36.7 C) (!) 97.3 F (36.3 C)  TempSrc:   Oral Axillary  SpO2: 97% 96% 97% 96%  Weight:      Height:        Intake/Output Summary (Last 24 hours) at 03/08/2023 1503 Last data filed at 03/08/2023 1429 Gross per 24 hour  Intake 1963.1 ml  Output 700 ml  Net 1263.1 ml      03/08/2023    4:26 AM 03/07/2023    5:00 AM 03/06/2023   10:26 PM  Last 3 Weights  Weight (lbs) 212 lb 4.9 oz 210 lb 12.2 oz 210 lb 15.7 oz  Weight (kg) 96.3 kg 95.6 kg 95.7 kg      Telemetry    Normal  sinus rhythm- Personally Reviewed  ECG     - Personally Reviewed  Physical Exam   Constitutional:  oriented to person, place, and time. No distress.  HENT:  Head: Grossly normal Eyes:  no discharge. No scleral icterus.  Neck: No JVD, no carotid bruits  Cardiovascular: Regular rate and rhythm, no murmurs appreciated Pulmonary/Chest: Dullness at the right base halfway up, clear on the left Abdominal: Soft.  no distension.  no tenderness.  Musculoskeletal: Normal range of motion Neurological:  normal muscle tone. Coordination normal. No atrophy Skin: Skin warm and dry Psychiatric: normal affect, pleasant   Labs    High Sensitivity Troponin:   Recent Labs  Lab 02/17/23 1837 02/17/23 2033 03/05/23 1217 03/05/23 1417  TROPONINIHS 14 15 11 12      Chemistry Recent Labs  Lab 03/05/23 1217 03/06/23 0430 03/07/23 0629 03/08/23 0303  NA 137 138 140 137  K 5.1 4.8 3.9 4.3  CL 106 104 105 100  CO2 23 27 26 29   GLUCOSE 112* 139* 112* 120*  BUN 17 18 27* 29*  CREATININE 1.31* 1.44* 1.42* 1.28*  CALCIUM 8.5* 8.5* 8.4* 8.7*  MG  --   --  2.4  --   PROT 6.4* 7.5 6.5  --   ALBUMIN 3.0* 3.1* 2.9*  --   AST 25 16 16   --   ALT 8 9 9   --   ALKPHOS 58 63 55  --   BILITOT 0.7 0.5 0.5  --   GFRNONAA 53* 48* 48* 55*  ANIONGAP 8 7 9 8     Lipids No results for input(s): "CHOL", "TRIG", "HDL", "LABVLDL", "LDLCALC", "CHOLHDL" in the last 168 hours.  Hematology Recent Labs  Lab 03/05/23 1217 03/06/23 0430 03/07/23 0629  WBC 4.8 3.5* 5.5  RBC 3.48* 3.58* 3.28*  HGB 10.1* 10.1* 9.5*  HCT 33.3* 34.2* 31.2*  MCV 95.7 95.5 95.1  MCH 29.0 28.2 29.0  MCHC 30.3 29.5* 30.4  RDW 15.9* 15.7* 15.9*  PLT 253 304 316   Thyroid No results for input(s): "TSH", "FREET4" in the last 168 hours.  BNP Recent Labs  Lab 03/05/23 1217  BNP 27.5    DDimer No results for input(s): "DDIMER" in the last 168 hours.   Radiology    US THORACENTESIS ASP PLEURAL SPACE W/IMG GUIDE  Result Date:  03/07/2023 INDICATION: 88 year old male. History of metastatic melanoma and CHF. Present in the right-sided pleural effusion. Request is for therapeutic and diagnostic thoracentesis EXAM: ULTRASOUND GUIDED THERAPEUTIC AND DIAGNOSTIC RIGHT-SIDED THORACENTESIS MEDICATIONS: Lidocaine 1% 10 mL COMPLICATIONS: None immediate. PROCEDURE: An ultrasound guided thoracentesis was thoroughly discussed with the patient and questions answered. The benefits, risks, alternatives and complications were also discussed. The patient understands and wishes to proceed with the procedure. Written consent was obtained. Ultrasound was performed to localize and mark an adequate pocket of fluid in the right chest. The area was then prepped and draped in the normal sterile fashion. 1% Lidocaine was used for local anesthesia. Under ultrasound guidance a 6 Fr Safe-T-Centesis catheter was introduced. Thoracentesis was performed. The catheter was removed and a dressing applied. FINDINGS: A total of approximately 500 mL of serosanguineous fluid was removed. Samples were sent to the laboratory as requested by the clinical team. IMPRESSION: Successful ultrasound guided therapeutic and diagnostic right-sided thoracentesis yielding 500 mL of pleural fluid. Read by: Anders Grant, NP Electronically Signed   By: Acquanetta Belling M.D.   On: 03/07/2023 08:01   DG Chest Port 1 View  Result Date: 03/06/2023 CLINICAL DATA:  Status post right thoracentesis. EXAM: PORTABLE CHEST - 1 VIEW COMPARISON:  03/05/2023 FINDINGS: Cardiomediastinal silhouette and pulmonary vasculature are within normal limits. No pneumothorax status post right thoracentesis. Moderate size effusion remains. Nodular opacity again noted in the left mid lung. IMPRESSION: No pneumothorax status post right thoracentesis. Electronically Signed   By: Acquanetta Belling M.D.   On: 03/06/2023 16:41    Cardiac Studies     Patient Profile     Aaron Gibbs is a 88 y.o. male with a hx of  blindness, chronic pain on chronic opiods, COPD, HFpEF, depression/anxiety, HTN, traumatic C2 fracture (April 2024), melanoma of the right forearm, now metastatic to the left forearm s/p resection 8/29) with PICC line who is being seen 03/06/2023 for the evaluation of heart failure   Assessment & Plan    Acute respiratory failure with hypoxia, right pleural effusion Presenting with shortness of breath Underwent thoracentesis on right this admission with 500 out Recent echocardiogram with normal ejection fraction estimated 55 to 60% -Pleural effusion likely secondary to underlying malignancy Reaccumulation of effusion since thoracentesis, worsening shortness of breath today -Would recommend repeat imaging, consideration of Pleurx catheter  Metastatic  melanoma Recent surgery on forearm Likely contributing to pleural effusion  VT Noted on recent hospitalization on monitor unrelated to weakness which was primary admitting diagnosis -Was started on amiodarone, plan to continue 200 twice daily for now Coreg up to 12.5 twice daily Would likely be able to restart 25 twice daily tomorrow  Failure to thrive Notes indicating he is followed by hospice Diagnosis of metastatic melanoma  [facility] will sign off.   Medication Recommendations: Patient changes as above, higher dose Coreg with titration up to 25 twice daily tomorrow Other recommendations (labs, testing, etc): No further cardiac testing Follow up as an outpatient: Will be followed by hospice as outpatient  Case discussed with hospitalist service  Total encounter time more than 50 minutes  Greater than 50% was spent in counseling and coordination of care with the patient   For questions or updates, please contact [facility] Please consult [email or web address] for contact info under        Signed, Julien Nordmann, MD  03/08/2023, 3:03 PM

## 2023-03-09 ENCOUNTER — Inpatient Hospital Stay: Payer: Medicare Other | Admitting: Radiology

## 2023-03-09 DIAGNOSIS — I5032 Chronic diastolic (congestive) heart failure: Secondary | ICD-10-CM | POA: Diagnosis not present

## 2023-03-09 DIAGNOSIS — J90 Pleural effusion, not elsewhere classified: Secondary | ICD-10-CM | POA: Diagnosis not present

## 2023-03-09 DIAGNOSIS — J9601 Acute respiratory failure with hypoxia: Secondary | ICD-10-CM | POA: Diagnosis not present

## 2023-03-09 HISTORY — PX: IR GUIDED DRAIN W CATHETER PLACEMENT: IMG719

## 2023-03-09 LAB — BASIC METABOLIC PANEL
Anion gap: 6 (ref 5–15)
BUN: 23 mg/dL (ref 8–23)
CO2: 29 mmol/L (ref 22–32)
Calcium: 8.4 mg/dL — ABNORMAL LOW (ref 8.9–10.3)
Chloride: 103 mmol/L (ref 98–111)
Creatinine, Ser: 1 mg/dL (ref 0.61–1.24)
GFR, Estimated: >60 mL/min (ref >60)
Glucose, Bld: 102 mg/dL — ABNORMAL HIGH (ref 70–99)
Potassium: 4.3 mmol/L (ref 3.5–5.1)
Sodium: 138 mmol/L (ref 135–145)

## 2023-03-09 LAB — GLUCOSE, CAPILLARY: Glucose-Capillary: 88 mg/dL (ref 70–99)

## 2023-03-09 MED ORDER — FENTANYL CITRATE (PF) 100 MCG/2ML IJ SOLN
INTRAMUSCULAR | Status: AC | PRN
  Administered 2023-03-09 (×2): 50 ug via INTRAVENOUS

## 2023-03-09 MED ORDER — LIDOCAINE 1 % OPTIME INJ - NO CHARGE
10.0000 mL | Freq: Once | INTRAMUSCULAR | Status: AC
  Administered 2023-03-09: 10 mL via INTRADERMAL

## 2023-03-09 MED ORDER — BUPRENORPHINE 10 MCG/HR TD PTWK
1.0000 | MEDICATED_PATCH | TRANSDERMAL | Status: DC
  Administered 2023-03-09: 1 via TRANSDERMAL
  Filled 2023-03-09: qty 1

## 2023-03-09 MED ORDER — FENTANYL CITRATE (PF) 100 MCG/2ML IJ SOLN
INTRAMUSCULAR | Status: AC
  Filled 2023-03-09: qty 2

## 2023-03-09 MED ORDER — MIDAZOLAM HCL 2 MG/2ML IJ SOLN
INTRAMUSCULAR | Status: AC
  Filled 2023-03-09: qty 2

## 2023-03-09 MED ORDER — AMLODIPINE BESYLATE 5 MG PO TABS
5.0000 mg | ORAL_TABLET | Freq: Every day | ORAL | Status: DC
  Administered 2023-03-09: 5 mg via ORAL
  Filled 2023-03-09: qty 1

## 2023-03-09 MED ORDER — MIDAZOLAM HCL 2 MG/2ML IJ SOLN
INTRAMUSCULAR | Status: AC | PRN
Start: 2023-03-09 — End: 2023-03-09
  Administered 2023-03-09 (×2): 1 mg via INTRAVENOUS

## 2023-03-09 NOTE — TOC Progression Note (Signed)
Transition of Care [facility]) - Progression Note    Patient Details  Name: Aaron Gibbs MRN: [id number] Date of Birth: 08/07/1934  Transition of [facility]) CM/SW Contact  Chapman Fitch, RN Phone Number: 03/09/2023, 1:02 PM  Clinical Narrative:    Pleurex placement today [name] with [facility] To confirm that home o2 was delivered Signed DNR on chart Anticipated dc tomorrow home with hospice    Expected Discharge Plan: Home w Hospice Care    Expected Discharge Plan and Services                                               Social Determinants of Health (SDOH) Interventions SDOH Screenings   Food Insecurity: No Food Insecurity (02/18/2023)  Housing: Low Risk  (02/18/2023)  Transportation Needs: No Transportation Needs (02/18/2023)  Utilities: Not At Risk (02/18/2023)  Alcohol Screen: Low Risk  (02/02/2023)  Depression (PHQ2-9): Low Risk  (02/02/2023)  Recent Concern: Depression (PHQ2-9) - Medium Risk (12/27/2022)  Financial Resource Strain: Low Risk  (07/13/2022)  Physical Activity: Inactive (07/13/2022)  Social Connections: Socially Isolated (07/13/2022)  Stress: Stress Concern Present (07/13/2022)  Tobacco Use: Medium Risk (03/05/2023)    Readmission Risk Interventions    02/20/2023    9:47 AM  Readmission Risk Prevention Plan  Transportation Screening Complete  Medication Review (RN Care Manager) Referral to Pharmacy  PCP or Specialist appointment within 3-5 days of discharge Complete  HRI or Home Care Consult Complete  SW Recovery Care/Counseling Consult Complete  Palliative Care Screening Not Applicable  Skilled Nursing Facility Not Applicable

## 2023-03-09 NOTE — Progress Notes (Signed)
[facility]- Civil engineer, contracting  Received confirmation from DME company today that 02 was delivered to the home yesterday.  Patient had pleurx catheter placed today.  TOC anticipates discharge tomorrow.  [facility] referral's team notified of anticipated discharge .  Please don't hesitate to call with any Hospice related questions or concerns.    Thank you for the opportunity to participate in this patient's care. [facility] Liaison [phone]

## 2023-03-09 NOTE — Procedures (Signed)
Interventional Radiology Procedure Note  Date of Procedure: 03/09/2023  Procedure: PleurX placement   Findings:  1. Successful placement of right PleurX   2. Drained 1.2L serosanguinous fluid from right pleural space following placement   Complications: No immediate complications noted.   Estimated Blood Loss: minimal  Follow-up and Recommendations: 1. Ready for use starting tomorrow    Olive Bass, MD  Vascular & Interventional Radiology  03/09/2023 12:07 PM

## 2023-03-09 NOTE — Sedation Documentation (Signed)
Pleurex catheter placemnt right chest. Versed 2 mg fentanyl 100 mcg iv given for moderate sedation. Increased oxygen to 4 liters [place].

## 2023-03-09 NOTE — Progress Notes (Signed)
Progress Note   Patient: Aaron Gibbs NWG:[id number] DOB: Jul 22, 1934 DOA: 03/05/2023     4 DOS: the patient was seen and examined on 03/09/2023   Brief hospital course: Aaron Gibbs is a 88 y.o. 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   They presented from home to the ED on 03/05/2023 with acute SOB x 2-3 days. Was recently discharged from hospital stay on 9/5 after receiving care for ventricular tachycardia. No oxygen requirement at baseline.    In the ED, it was found that they had Tmax nine 9.2, heart rate 60s, respirations stable, blood pressure 100s to 150s over 70s. Satting in the mid 90s on 4 L.  Significant findings included White count 4.8, hemoglobin 10.1, platelets 253, creatinine 1.31.  Chest x-ray with interval development of large right pleural effusion. No patchy opacities in the left midlung and lower lung suspicious for pneumonia.   They were initially treated with amiodarone, lasix, CTX, doxycycline.    Patient was admitted to medicine service for further workup and management of acute hypoxic respiratory failure as outlined in detail below.  9/17 -- thoracentesis with 500 cc fluid removed 918 -- qualifies for home oxygen 2 L/min due to ambulatory desaturation to 87% on room air 9/20 -- Pleurx cathter placed  Plan is for discharge home with hospice when medically stable / improved.  Assessment and Plan:  Acute respiratory failure with hypoxia (HCC) Acute R- pleural effusion, likely malignant effusion - only small effusion present on recent admission for Vtach so accumulated significant fluid since 8/31. Initially required 4L/min O2 by  without baseline oxygen support.  IR consulted for thoracentesis -- 9/17 removed 500cc. F/u chest xray neg for pneumothorax. Pathologist smear notes atypical cells present in pleural fluid. -  follow pending pleural fluid cultures and cytology - continue Rocephin and Doxy - f/u cultures/labs - supplemental O2 to keep spO2 > 90% - treated with IV diuresis - now on hold - Cardiology consulted, appreciate input - 9/19 - IR consult for placement of Pleurx catheter as palliative measure, given re-accumulation is expected and pt still very symptomatic post thoracentesis 2 days ago - 9/20 - Pleurx catheter placed today.  1.2 L pleural fluid removed.  Respiratory status improved.   H/o ventricular tachycardia- appears to be well controlled this admission.  - cardiology consulted, appreciate your recs - continue amiodarone at 200mg  BID    Chronic heart failure with preserved ejection fraction [facility]) 2D echo September 2024 with EF 55 to 60%. Euvolemic  - given IV diuresis, now on hold - mgmt per Cardiology  - Coreg resumed  Traumatic closed fracture of C2 vertebra: with minimal displacement, sequela.  Continue on c-collar.  Pain control prn    COPD- does not appear to have significant reactive/inflammatory component to respiratory decline. No wheezing on exam. No cough or increased sputum production. Is on empiric Abx - DuoNebs PRN    Infrarenal abdominal aortic aneurysm (AAA) without rupture (HCC) preoperative clearance for endovascular repair as per vasc surg. 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  Continue on doxycycline.  Management per oncology   Obesity Body mass index is 31.98 kg/m. Complicates overall care and prognosis.  Recommend lifestyle modifications including physical activity  and diet for weight loss and overall long-term health.   Advanced Care Planning - Palliative care consulted, appreciate GOC assistance. Plan is d/c home with hospice.      Subjective:  Pt seen seated edge of bed today.  Seen after having Pleurx catheter placed this AM.  He reports breathing and anxiety feel about the same.  He appears much more calm and comfortable today than yesterday.  Reports ongoing neck pain.  He asks if this tube is permanent or temporary.  Discussed it could be kept in permanently, if it helps keep his breathing stable with ability to drain off fluid re-accumulating.  He expressed understanding and agreement.  Physical Exam: Vitals:   03/09/23 1230 03/09/23 1245 03/09/23 1313 03/09/23 1356  BP: (!) 160/70 (!) 167/69 (!) 173/69 (!) 162/69  Pulse: (!) 58 (!) 57 (!) 59 60  Resp: 15 18 18 17   Temp:   98.1 F (36.7 C) 98.1 F (36.7 C)  TempSrc:    Oral  SpO2: 95% 95% 94% 97%  Weight:      Height:       General exam: awake, alert, no acute distress, chronically ill appearing, obese HEENT: moist mucus membranes, hearing grossly normal  Respiratory system: CTAB with improved but still diminished right base, pleurx in place on right side, no wheezes, normal respiratory effort at rest, on 2 L/min [place] O2. Cardiovascular system: normal S1/S2, RRR  Gastrointestinal system: soft, NT, ND, no HSM felt, +bowel sounds. Central nervous system: A&O xself and hospital at least. no gross focal neurologic deficits, normal speech Extremities: left forearm dressing clean dry intact with improving edema proximally Skin: dry, intact, normal temperature Psychiatry: normal mood, congruent affect, judgement and insight appear intact   Data Reviewed:  Notable labs --- glucose 102, Cr 1.42 >> 1.28 >> 1.00 improved   Family Communication: spoke to daughter by phone & updated 9/18, 9/19, 9/20   Disposition: Status is: Inpatient Remains inpatient appropriate because: severity of illness, closely monitoring for stable respiratory status with pleurx cathter placed this AM. Anticipate dc home tomorrow.    Planned Discharge Destination:  Home with hospice    Time spent:  45 minutes  Author: Pennie Banter, DO 03/09/2023 3:07 PM  For on call review [email or web address].

## 2023-03-09 NOTE — Progress Notes (Addendum)
Pt back from pleurex placement. Pt aox4 but drowsy, respirations even and unlabored. . Site visualized with IR nurse at bedside, dressing is CDI. BP elevated otherwise all other vitals stable at this time. Pt in bed with sitter at bedside at this time. Pt having pain at this time, see mar

## 2023-03-09 NOTE — Consult Note (Signed)
Chief Complaint: Patient was seen in consultation today for recurrent right pleural effusion  Referring Physician(s): Esaw Grandchild, DO  Supervising Physician: Pernell Dupre  Patient Status: [facility] - In-pt  History of Present Illness: Aaron Gibbs is a 88 y.o. male with PMH significant for CHF, AAA, anxiety, COPD, GERD, hypertension, malignant melanoma, and opioid dependence being seen today in relation to recurrent right pleural effusion. Patient underwent thoracentesis with IR on 03/07/23 which revealed atypical cells concerning for malignancy. Given suspected malignancy and multiple comorbidities, patient was evaluated for hospice care and plan is for discharge home with hospice care. Right tunneled PleurX catheter placement has been requested to assist with transition to hospice care after right pleural effusion reaccumulation.  Past Medical History:  Diagnosis Date   (HFpEF) heart failure with preserved ejection fraction (HCC)    AAA (abdominal aortic aneurysm) without rupture (HCC)    5cm/Followed by Festus Barren MD/Vascular/[facility]   Ankylosing spondylitis lumbar region [facility])    Anxiety    C2 cervical fracture (HCC) 09/2022   Traumatic Fall. Medically manage/no surgical intervention/Soft Collar   Chronic pain after traumatic injury 09/2022   Traumatic Fall= C2 Fracture. 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 malignant melanoma of skin (HCC)    Left Forearm   Major depressive disorder, recurrent episode, severe (HCC)    Melanoma in situ of left upper arm (HCC)    s/p excision 02/15/23   Multiple falls    Opioid dependence (HCC)    Osteoarthritis    Retinitis pigmentosa of both eyes    Legally Blind   Sensorineural hearing loss (SNHL)  of both ears    Suicide attempt (HCC) (05/13/18) 06/26/2018    Past Surgical History:  Procedure Laterality Date   CHOLECYSTECTOMY     COLONOSCOPY     EXCISION MELANOMA WITH SENTINEL LYMPH NODE BIOPSY Left 01/11/2023   Procedure: CAUTERIZATION OF BLEEDING WOUND ON LEFT FOREARM;  Surgeon: [name], MD;  Location: [facility];  Service: General;  Laterality: Left;   EXCISION MELANOMA WITH SENTINEL LYMPH NODE BIOPSY N/A 02/15/2023   Procedure: WIDE LOCAL EXCISION LEFT FOREARM MELANOMA SITES WITH SKIN SUBSTITUTE COVERAGE, SENTINEL LYMPH NODE BIOPSY;  Surgeon: [name], MD;  Location: [facility] OR;  Service: General;  Laterality: N/A;   TRANSURETHRAL RESECTION OF PROSTATE N/A 2008    Allergies: Anthralin and Azithromycin  Medications: Prior to Admission medications   Medication Sig Start Date End Date Taking? Authorizing Provider  acetaminophen (TYLENOL) 500 MG tablet Take 1,000 mg by mouth in the morning, at noon, and at bedtime.   Yes [provider]  albuterol (VENTOLIN HFA) 108 (90 Base) MCG/ACT inhaler Inhale 2 puffs into the lungs every 4 (four) hours as needed for wheezing or shortness of breath.   Yes [provider]  amiodarone (PACERONE) 400 MG tablet Take 1 tablet (400 mg total) by mouth 2 (two) times daily for 14 days. 02/22/23 03/08/23 Yes Charise Killian, MD  buprenorphine (BUTRANS) 10 MCG/HR PTWK Place 1 patch onto the skin once a week for 28 days. Apply only 1 patch at a time and alternate sites weekly. 12/27/22 02/13/24 Yes Delano Metz, MD  carvedilol (COREG) 12.5 MG tablet Take 1 tablet (12.5 mg total)  by mouth 2 (two) times daily. 12/27/22  Yes Karamalegos, Netta Neat, DO  COSOPT 22.3-6.8 MG/ML ophthalmic solution Place 1 drop into both eyes 2 (two) times daily. 06/25/20  Yes [provider]  diclofenac Sodium (VOLTAREN) 1 % GEL Apply 2 g topically at bedtime. Apply to both knees twice daily Patient taking differently: Apply 2 g topically at  bedtime. Apply to both knees at night 02/10/21 10/17/23 Yes Karamalegos, Netta Neat, DO  doxycycline (VIBRA-TABS) 100 MG tablet Take 1 tablet (100 mg total) by mouth 2 (two) times daily. 02/15/23  Yes [name], MD  DULoxetine (CYMBALTA) 30 MG capsule Take 3 capsules (90 mg total) by mouth daily. 12/27/22  Yes Karamalegos, Netta Neat, DO  finasteride (PROSCAR) 5 MG tablet Take 1 tablet (5 mg total) by mouth daily. 12/27/22  Yes Karamalegos, Netta Neat, DO  gabapentin (NEURONTIN) 300 MG capsule Take 1 capsule (300 mg total) by mouth 2 (two) times daily. 12/27/22  Yes Karamalegos, Netta Neat, DO  Iron, Ferrous Sulfate, 325 (65 Fe) MG TABS Take 325 mg by mouth daily with supper. 06/06/22  Yes Karamalegos, Netta Neat, DO  latanoprost (XALATAN) 0.005 % ophthalmic solution Place 1 drop into both eyes at bedtime. 02/28/22  Yes [provider]  Melatonin 5 MG CAPS Take 1 capsule (5 mg total) by mouth at bedtime. Patient taking differently: Take 15 mg by mouth at bedtime. 02/03/22  Yes Karamalegos, Netta Neat, DO  meloxicam (MOBIC) 15 MG tablet TAKE 1 TABLET(15 MG) BY MOUTH DAILY 02/20/23  Yes Karamalegos, Alexander J, DO  OLANZapine (ZYPREXA) 2.5 MG tablet Take 1 tablet (2.5 mg total) by mouth 2 (two) times daily. 12/27/22  Yes Karamalegos, Netta Neat, DO  olmesartan (BENICAR) 40 MG tablet Take 1 tablet (40 mg total) by mouth daily. 01/30/23  Yes Karamalegos, Netta Neat, DO  pantoprazole (PROTONIX) 40 MG tablet Take 1 tablet (40 mg total) by mouth daily before breakfast. 12/27/22  Yes Karamalegos, Netta Neat, DO  polyethylene glycol (MIRALAX / GLYCOLAX) 17 g packet Take 17 g by mouth 2 (two) times daily. 10/30/22  Yes Narda Bonds, MD  rosuvastatin (CRESTOR) 5 MG tablet Take 1 tablet (5 mg total) by mouth every evening. 02/22/23 03/24/23 Yes Charise Killian, MD  tamsulosin (FLOMAX) 0.4 MG CAPS capsule Take 1 capsule (0.4 mg total) by mouth daily. 12/27/22  Yes Karamalegos, Netta Neat, DO   traZODone (DESYREL) 150 MG tablet Take 1 tablet (150 mg total) by mouth at bedtime as needed. 12/27/22  Yes Karamalegos, Netta Neat, DO  amiodarone (PACERONE) 200 MG tablet Take 1 tablet (200 mg total) by mouth 2 (two) times daily. Only start this script after completing amiodarone 400 mg BID x 14 days. 02/22/23 03/24/23  Charise Killian, MD  oxyCODONE (OXY IR/ROXICODONE) 5 MG immediate release tablet Take 1 tablet (5 mg total) by mouth every 6 (six) hours as needed for severe pain. Patient not taking: Reported on 03/05/2023 02/15/23   [name], MD     Family History  Problem Relation Age of Onset   Depression Mother    Heart disease Father     Social History   Socioeconomic History   Marital status: Divorced    Spouse name: Not on file   Number of children: Not on file   Years of education: Not on file   Highest education level: Not on file  Occupational History   Occupation: retired  Tobacco Use   Smoking status: Former    Types: Cigarettes  Smokeless tobacco: Former    Types: Chew   Tobacco comments:    stopped 15 years ago  Vaping Use   Vaping status: Never Used  Substance and Sexual Activity   Alcohol use: Not Currently    Comment: past   Drug use: Never   Sexual activity: Not Currently  Other Topics Concern   Not on file  Social History Narrative   Not on file   Social Determinants of Health   Financial Resource Strain: Low Risk  (07/13/2022)   Overall Financial Resource Strain (CARDIA)    Difficulty of Paying Living Expenses: Not hard at all  Food Insecurity: No Food Insecurity (02/18/2023)   Hunger Vital Sign    Worried About Running Out of Food in the Last Year: Never true    Ran Out of Food in the Last Year: Never true  Transportation Needs: No Transportation Needs (02/18/2023)   PRAPARE - Administrator, Civil Service (Medical): No    Lack of Transportation (Non-Medical): No  Physical Activity: Inactive (07/13/2022)   Exercise Vital Sign     Days of Exercise per Week: 0 days    Minutes of Exercise per Session: 0 min  Stress: Stress Concern Present (07/13/2022)   [employer] of Occupational Health - Occupational Stress Questionnaire    Feeling of Stress : Rather much  Social Connections: Socially Isolated (07/13/2022)   Social Connection and Isolation Panel [NHANES]    Frequency of Communication with Friends and Family: More than three times a week    Frequency of Social Gatherings with Friends and Family: Twice a week    Attends Religious Services: Never    Database administrator or Organizations: No    Attends Banker Meetings: Never    Marital Status: Divorced    Patient currently has DNR order in place. Discussion with the patient and family regarding wishes.  The DNR order is rescinded during the procedure and the patient consents to the use of any resuscitation procedure needed to treat the clinical events that occur.   Review of Systems: A 12 point ROS discussed and pertinent positives are indicated in the HPI above.  All other systems are negative.  Review of Systems  Constitutional:  Negative for chills and fever.  Respiratory:  Positive for shortness of breath. Negative for chest tightness.   Cardiovascular:  Positive for leg swelling. Negative for chest pain.  Gastrointestinal:  Negative for abdominal pain, diarrhea, nausea and vomiting.  Neurological:  Negative for dizziness and headaches.  Psychiatric/Behavioral:  Positive for confusion.     Vital Signs: BP (!) 178/81 (BP Location: Right Arm)   Pulse 66   Temp 98 F (36.7 C)   Resp 18   Ht 5\' 10"  (1.778 m)   Wt 221 lb 5.5 oz (100.4 kg)   SpO2 95%   BMI 31.76 kg/m    Physical Exam Vitals reviewed.  Constitutional:      General: He is not in acute distress.    Appearance: He is ill-appearing.  HENT:     Mouth/Throat:     Mouth: Mucous membranes are moist.  Cardiovascular:     Rate and Rhythm: Normal rate and regular  rhythm.     Pulses: Normal pulses.     Heart sounds: Normal heart sounds.  Pulmonary:     Comments: Patient on O2 via [place]. Decreased breath sounds on right Abdominal:     Palpations: Abdomen is soft.     Tenderness: There  is no abdominal tenderness.  Musculoskeletal:     Right lower leg: Edema present.     Left lower leg: Edema present.  Skin:    General: Skin is warm and dry.  Neurological:     Mental Status: He is alert. He is disoriented.  Psychiatric:        Mood and Affect: Mood normal.        Behavior: Behavior normal.        Thought Content: Thought content normal.        Judgment: Judgment normal.     Imaging: DG Chest Port 1 View  Result Date: 03/08/2023 CLINICAL DATA:  Pleural effusion EXAM: PORTABLE CHEST 1 VIEW COMPARISON:  CT of the chest 03/06/2023.  CT chest 03/05/2023. FINDINGS: Moderate-sized right pleural effusion is present, mildly decreased. There are patchy airspace opacities in the left lower lung similar to prior. Left costophrenic angle is clear. No pneumothorax. Cardiomediastinal silhouette is unchanged. Left axillary surgical clips are present. No acute fractures are seen. IMPRESSION: 1. Moderate-sized right pleural effusion, mildly decreased from prior. 2. Patchy airspace opacities in the left lower lung similar to prior. Electronically Signed   By: Darliss Cheney M.D.   On: 03/08/2023 16:43   US THORACENTESIS ASP PLEURAL SPACE W/IMG GUIDE  Result Date: 03/07/2023 INDICATION: 88 year old male. History of metastatic melanoma and CHF. Present in the right-sided pleural effusion. Request is for therapeutic and diagnostic thoracentesis EXAM: ULTRASOUND GUIDED THERAPEUTIC AND DIAGNOSTIC RIGHT-SIDED THORACENTESIS MEDICATIONS: Lidocaine 1% 10 mL COMPLICATIONS: None immediate. PROCEDURE: An ultrasound guided thoracentesis was thoroughly discussed with the patient and questions answered. The benefits, risks, alternatives and complications were also discussed. The patient  understands and wishes to proceed with the procedure. Written consent was obtained. Ultrasound was performed to localize and mark an adequate pocket of fluid in the right chest. The area was then prepped and draped in the normal sterile fashion. 1% Lidocaine was used for local anesthesia. Under ultrasound guidance a 6 Fr Safe-T-Centesis catheter was introduced. Thoracentesis was performed. The catheter was removed and a dressing applied. FINDINGS: A total of approximately 500 mL of serosanguineous fluid was removed. Samples were sent to the laboratory as requested by the clinical team. IMPRESSION: Successful ultrasound guided therapeutic and diagnostic right-sided thoracentesis yielding 500 mL of pleural fluid. Read by: Anders Grant, NP Electronically Signed   By: Acquanetta Belling M.D.   On: 03/07/2023 08:01   DG Chest Port 1 View  Result Date: 03/06/2023 CLINICAL DATA:  Status post right thoracentesis. EXAM: PORTABLE CHEST - 1 VIEW COMPARISON:  03/05/2023 FINDINGS: Cardiomediastinal silhouette and pulmonary vasculature are within normal limits. No pneumothorax status post right thoracentesis. Moderate size effusion remains. Nodular opacity again noted in the left mid lung. IMPRESSION: No pneumothorax status post right thoracentesis. Electronically Signed   By: Acquanetta Belling M.D.   On: 03/06/2023 16:41   CT Chest W Contrast  Result Date: 03/05/2023 CLINICAL DATA:  Shortness of breath EXAM: CT CHEST WITH CONTRAST TECHNIQUE: Multidetector CT imaging of the chest was performed during intravenous contrast administration. RADIATION DOSE REDUCTION: This exam was performed according to the departmental dose-optimization program which includes automated exposure control, adjustment of the mA and/or kV according to patient size and/or use of iterative reconstruction technique. CONTRAST:  80mL OMNIPAQUE IOHEXOL 350 MG/ML SOLN COMPARISON:  CT chest angio dated February 18, 2023 FINDINGS: Cardiovascular: Normal heart  size. No pericardial effusion. Normal caliber thoracic aorta with moderate atherosclerotic disease. Moderate coronary artery calcifications. No suspicious filling defects  of the central pulmonary arteries. Mediastinum/Nodes: Esophagus thyroid are unremarkable. Fluid collection of the left axilla with adjacent surgical clips measuring 7.1 x 1.6 cm on series 3, image 58. No enlarged lymph nodes seen in the chest. Lungs/Pleura: Central airways are patent. Increased large right pleural effusion causing complete collapse of the right lower and middle lobes and near-complete collapse of the right upper lobe. New trace left pleural effusion. Decreased nodular opacity of the posterior left upper lobe measuring 2.6 x 1.3 cm, previously 3.2 x 1.7 cm. Similar patchy linear nodular opacities of the left upper lobe. Newnodule of the left hemithorax measuring 11 x 7 mm located along the oblique fissure on series 4, image 82, likely reactive pulmonary lymph node. New solid pulmonary nodule of the left lower lobe measuring 9 x 5 mm on series 4, image 103. Upper Abdomen: Partially visualized abdominal aortic aneurysm, better seen on prior. Musculoskeletal: No chest wall abnormality. Diffuse ankylosis of the thoracic spine. No aggressive appearing osseous lesions. IMPRESSION: 1. Large right pleural effusion causing complete collapse of the right lower and middle lobes and near-complete collapse of the right upper lobe. 2. New trace left pleural effusion. 3. Decreased nodular opacity of the posterior left upper lobe. Recommend short-term follow-up chest CT in 3 months to ensure resolution. 4. New solid pulmonary nodule of the left lower lobe, likely infectious or inflammatory given rapid interval development. 5. New well-defined solid nodule located along the oblique left fissure, likely reactive pulmonary lymph node. Recommend attention on follow-up. 6. Similar patchy linear nodular opacities of the left upper lobe. Recommend  attention on follow-up. 7. Fluid collection of the left axilla with adjacent surgical clips, likely a postsurgical seroma. 8. Aortic Atherosclerosis (ICD10-I70.0). Electronically Signed   By: Allegra Lai M.D.   On: 03/05/2023 16:28   DG Chest Portable 1 View  Result Date: 03/05/2023 CLINICAL DATA:  Shortness of breath EXAM: PORTABLE CHEST - 1 VIEW COMPARISON:  02/17/2023 FINDINGS: Cardiomediastinal silhouette and pulmonary vasculature are within normal limits. Interval development of large right pleural effusion. Additional patchy opacities also present in the left mid to lower lung. IMPRESSION: 1. Interval development of large right pleural effusion. 2. New patchy opacities in the left mid to lower lung suspicious for pneumonia. Electronically Signed   By: Acquanetta Belling M.D.   On: 03/05/2023 14:30   CT ANGIO HEAD NECK W WO CM  Result Date: 02/19/2023 CLINICAL DATA:  Headache, secondary (Ped 0-17y) EXAM: CT ANGIOGRAPHY HEAD AND NECK WITH AND WITHOUT CONTRAST TECHNIQUE: Multidetector CT imaging of the head and neck was performed using the standard protocol during bolus administration of intravenous contrast. Multiplanar CT image reconstructions and MIPs were obtained to evaluate the vascular anatomy. Carotid stenosis measurements (when applicable) are obtained utilizing NASCET criteria, using the distal internal carotid diameter as the denominator. RADIATION DOSE REDUCTION: This exam was performed according to the departmental dose-optimization program which includes automated exposure control, adjustment of the mA and/or kV according to patient size and/or use of iterative reconstruction technique. CONTRAST:  75mL OMNIPAQUE IOHEXOL 350 MG/ML SOLN COMPARISON:  CT head June 11 24. CTA head/neck May 8 24. FINDINGS: Limited study due to patient positioning (patient unable to lay on their back because of neck pain). Within this limitation: CT HEAD FINDINGS Brain: No evidence of acute infarction, hemorrhage,  hydrocephalus, extra-axial collection or mass lesion/mass effect. Patchy white matter hypodensities are nonspecific but compatible with chronic microvascular ischemic change. Vascular: See below. Skull: No acute fracture. Sinuses/Orbits: Moderate paranasal  sinus mucosal thickening. No acute orbital findings. Other: No mastoid effusions. Review of the MIP images confirms the above findings CTA NECK FINDINGS Aortic arch: Great vessel origins are patent. Aortic atherosclerosis. Right carotid system: Atherosclerosis at the carotid bifurcation without greater than 50% stenosis. Left carotid system: Atherosclerosis at the carotid bifurcation without greater than 50% stenosis. Vertebral arteries: Chronically occluded distal right V2 vertebral artery with or distal faint reconstitution at the V3 segment. Left vertebral artery is patent in the neck. Skeleton: Chronic C2 dens fracture. Other neck: No acute abnormality on limited assessment. Upper chest: Partially imaged right pleural effusion with overlying opacities, better characterized on recent CT chest. Review of the MIP images confirms the above findings CTA HEAD FINDINGS Anterior circulation: Bilateral intracranial ICAs, MCAs, and ACAs are patent proximally. Posterior circulation: Faint V3/V4 reconstitution with new/interval severe stenosis or occlusion of the intradural right vertebral artery. Left vertebral artery remains patent. The basilar artery and bilateral posterior cerebral arteries are patent without proximal high-grade stenosis. Venous sinuses: As permitted by contrast timing, patent. Review of the MIP images confirms the above findings IMPRESSION: 1. Chronic right V2 vertebral artery occlusion. There is faint V3/V4 reconstitution with new/interval severe stenosis or occlusion of the intradural right vertebral artery. Left vertebral artery remains patent. 2. Chronic C2 fracture. 3. Partially imaged right pleural effusion with overlying opacities, better  characterized on recent CT chest. 4. Limited study due to patient positioning. Electronically Signed   By: Feliberto Harts M.D.   On: 02/19/2023 13:12   ECHOCARDIOGRAM COMPLETE  Result Date: 02/19/2023    ECHOCARDIOGRAM REPORT   Patient Name:   JOSIA LITWILLER Date of Exam: 02/18/2023 Medical Rec #:  [id number]        Height:       71.0 in Accession #:    [id number]       Weight:       213.8 lb Date of Birth:  April 12, 1935        BSA:          2.169 m Patient Age:    87 years         BP:           141/87 mmHg Patient Gender: M                HR:           68 bpm. Exam Location:  [facility] Procedure: 2D Echo, Cardiac Doppler and Color Doppler Indications:     Syncope R55  History:         Patient has no prior history of Echocardiogram examinations.                  CHF; Risk Factors:Hypertension.  Sonographer:     Neysa Bonito Roar Referring Phys:  [id number] Andris Baumann Diagnosing Phys: Yvonne Kendall MD IMPRESSIONS  1. Left ventricular ejection fraction, by estimation, is 55 to 60%. The left ventricle has normal function. The left ventricle has no regional wall motion abnormalities. Left ventricular diastolic parameters are indeterminate.  2. Right ventricular systolic function is normal. The right ventricular size is normal. Tricuspid regurgitation signal is inadequate for assessing PA pressure.  3. Left atrial size was moderately dilated.  4. The mitral valve is grossly normal. Trivial mitral valve regurgitation. No evidence of mitral stenosis.  5. The aortic valve was not well visualized. There is mild calcification of the aortic valve. There is mild thickening of the aortic valve. Aortic valve regurgitation is mild to  moderate. Aortic valve sclerosis/calcification is present, without any evidence of aortic stenosis.  6. Aortic dilatation noted. There is borderline dilatation of the aortic root, measuring 40 mm. FINDINGS  Left Ventricle: Left ventricular ejection fraction, by estimation, is 55 to 60%. The left ventricle  has normal function. The left ventricle has no regional wall motion abnormalities. The left ventricular internal cavity size was normal in size. There is  borderline left ventricular hypertrophy. Left ventricular diastolic parameters are indeterminate. Right Ventricle: The right ventricular size is normal. Right vetricular wall thickness was not well visualized. Right ventricular systolic function is normal. Tricuspid regurgitation signal is inadequate for assessing PA pressure. Left Atrium: Left atrial size was moderately dilated. Right Atrium: Right atrial size was normal in size. Pericardium: The pericardium was not well visualized. Mitral Valve: The mitral valve is grossly normal. Trivial mitral valve regurgitation. No evidence of mitral valve stenosis. MV peak gradient, 2.9 mmHg. The mean mitral valve gradient is 2.0 mmHg. Tricuspid Valve: The tricuspid valve is not well visualized. Tricuspid valve regurgitation is trivial. Aortic Valve: The aortic valve was not well visualized. There is mild calcification of the aortic valve. There is mild thickening of the aortic valve. Aortic valve regurgitation is mild to moderate. Aortic regurgitation PHT measures 488 msec. Aortic valve sclerosis/calcification is present, without any evidence of aortic stenosis. Aortic valve mean gradient measures 6.0 mmHg. Aortic valve peak gradient measures 13.7 mmHg. Aortic valve area, by VTI measures 2.01 cm. Pulmonic Valve: The pulmonic valve was normal in structure. Pulmonic valve regurgitation is trivial. No evidence of pulmonic stenosis. Aorta: Aortic dilatation noted. There is borderline dilatation of the aortic root, measuring 40 mm. Pulmonary Artery: The pulmonary artery is of normal size. Venous: The inferior vena cava was not well visualized. IAS/Shunts: The interatrial septum was not well visualized.  LEFT VENTRICLE PLAX 2D LVIDd:         4.50 cm   Diastology LVIDs:         3.20 cm   LV e' medial:    10.80 cm/s LV PW:          1.00 cm   LV E/e' medial:  6.8 LV IVS:        1.10 cm   LV e' lateral:   9.46 cm/s LVOT diam:     1.90 cm   LV E/e' lateral: 7.8 LV SV:         69 LV SV Index:   32 LVOT Area:     2.84 cm  RIGHT VENTRICLE RV Basal diam:  3.30 cm RV Mid diam:    2.80 cm RV S prime:     11.70 cm/s TAPSE (M-mode): 2.6 cm LEFT ATRIUM              Index        RIGHT ATRIUM           Index LA diam:        4.15 cm  1.91 cm/m   RA Area:     17.60 cm LA Vol (A2C):   110.0 ml 50.71 ml/m  RA Volume:   41.50 ml  19.13 ml/m LA Vol (A4C):   85.5 ml  39.41 ml/m LA Biplane Vol: 98.8 ml  45.54 ml/m  AORTIC VALVE                     PULMONIC VALVE AV Area (Vmax):    1.67 cm      PV Vmax:  1.09 m/s AV Area (Vmean):   2.02 cm      PV Peak grad:     4.8 mmHg AV Area (VTI):     2.01 cm      PR End Diast Vel: 3.58 msec AV Vmax:           185.00 cm/s AV Vmean:          108.000 cm/s AV VTI:            0.343 m AV Peak Grad:      13.7 mmHg AV Mean Grad:      6.0 mmHg LVOT Vmax:         109.00 cm/s LVOT Vmean:        76.900 cm/s LVOT VTI:          0.243 m LVOT/AV VTI ratio: 0.71 AI PHT:            488 msec MITRAL VALVE MV Area (PHT): 3.89 cm    SHUNTS MV Area VTI:   2.08 cm    Systemic VTI:  0.24 m MV Peak grad:  2.9 mmHg    Systemic Diam: 1.90 cm MV Mean grad:  2.0 mmHg MV Vmax:       0.85 m/s MV Vmean:      60.5 cm/s MV Decel Time: 195 msec MV E velocity: 73.40 cm/s MV A velocity: 56.60 cm/s MV E/A ratio:  1.30 MV A Prime:    11.4 cm/s Yvonne Kendall MD Electronically signed by Yvonne Kendall MD Signature Date/Time: 02/19/2023/10:50:01 AM    Final    CT Angio Chest Pulmonary Embolism (PE) W or WO Contrast  Result Date: 02/18/2023 CLINICAL DATA:  Generalized weakness. Unable to ambulate as well as normal. Melanoma excision with sentinel lymph node biopsy on the left on 02/15/2023. PE suspected. Abdominal aortic aneurysm follow-up. EXAM: CT ANGIOGRAPHY CHEST, ABDOMEN AND PELVIS TECHNIQUE: Non-contrast CT of the chest was initially  obtained. Multidetector CT imaging through the chest, abdomen and pelvis was performed using the standard protocol during bolus administration of intravenous contrast. Multiplanar reconstructed images and MIPs were obtained and reviewed to evaluate the vascular anatomy. RADIATION DOSE REDUCTION: This exam was performed according to the departmental dose-optimization program which includes automated exposure control, adjustment of the mA and/or kV according to patient size and/or use of iterative reconstruction technique. CONTRAST:  OMNIPAQUE IOHEXOL 350 MG/ML SOLN COMPARISON:  Chest radiograph 02/17/2023; PET/CT 12/28/2022; CT abdomen and pelvis 10/17/2022; CT chest abdomen and pelvis 10/10/2022 FINDINGS: CTA CHEST FINDINGS Cardiovascular: Negative for acute pulmonary embolism. Normal caliber thoracic aorta without dissection. Coronary artery and aortic atherosclerotic calcification. No pericardial effusion. Right PICC tip in the low SVC. Mediastinum/Nodes: No thoracic adenopathy. Unremarkable trachea and esophagus. Lungs/Pleura: Bronchial wall thickening and scattered mucous plugging greatest in the right lower lobe. Bilateral lower lung bronchiolectasis. Scattered centrilobular and budding tree micro nodules greatest in the lingula. Decreased size of the thick-walled cavitary mass in the lingula compared with PET/CT 12/28/2022. This measures 3.2 x 1.7 cm today, previously 3.2 x 2.5 cm. Small right pleural effusion and associated atelectasis. No pneumothorax. Musculoskeletal: Subcutaneous emphysema in the anterolateral left chest wall. Loosely organized left axillary postprocedural hematoma/seroma measuring 4.9 x 2.2 x 4.6 cm related to recent axillary node biopsy. Thoracic kyphosis. No acute fracture. Fused anterior enthesophytes throughout the cervical and thoracic spine. Review of the MIP images confirms the above findings. CTA ABDOMEN AND PELVIS FINDINGS VASCULAR Aorta: Infrarenal abdominal aortic  aneurysm again measuring up to 5.0  cm in greatest diameter. Aortic calcified atherosclerosis. No dissection. Celiac: No hemodynamically significant stenosis, aneurysm, or dissection. SMA: No hemodynamically significant stenosis, aneurysm, or dissection. Renals: No hemodynamically significant stenosis, aneurysm, or dissection. IMA: Patent. Inflow: Calcified atherosclerosis without significant stenosis. No aneurysm or dissection. Veins: No obvious venous abnormality within the limitations of this arterial phase study. Review of the MIP images confirms the above findings. NON-VASCULAR Hepatobiliary: Cholecystectomy. No focal hepatic lesion. Unchanged biliary prominence. Pancreas: Fatty atrophy.  No acute abnormality. Spleen: Unremarkable. Adrenals/Urinary Tract: Stable adrenal glands. No urinary calculi or hydronephrosis. Unchanged 1.4 cm cystic lesion in the posterior right kidney. Unremarkable bladder. Stomach/Bowel: Normal caliber large and small bowel. Colonic diverticulosis without diverticulitis. Stomach is within normal limits. Normal appendix. Lymphatic: No lymphadenopathy. Reproductive: Unremarkable. Other: No free intraperitoneal fluid or air. Musculoskeletal: Demineralization. Bilateral femoral head AVN. Fused anterior syndesmophytes in the lumbar spine. Review of the MIP images confirms the above findings. IMPRESSION: 1. Negative for acute pulmonary embolism. 2. Infrarenal abdominal aortic aneurysm again measuring up to 5.0 cm in greatest diameter. Recommend follow-up CT/MR every 6 months and vascular consultation if not obtained. This recommendation follows ACR consensus guidelines: White Paper of the ACR Incidental Findings Committee II on Vascular Findings. J Am Coll Radiol 2013; 10:789-794. 3. Decreased size of the cavitary lesion in the lingula since PET/CT 12/28/2022. 4. Pulmonary findings compatible with infectious/inflammatory bronchiolitis. 5. Small right pleural effusion and associated  atelectasis. 6. Loosely organized left axillary postprocedural hematoma/seroma measuring 4.9 x 2.2 x 4.6 cm related to recent axillary node biopsy. 7. 1.4 cm cystic lesion in the posterior right kidney. If clinically appropriate, renal mass protocol MRI could be obtained for further evaluation. Aortic Atherosclerosis (ICD10-I70.0). Electronically Signed   By: Minerva Fester M.D.   On: 02/18/2023 01:03   CT Angio Abd/Pel w/ and/or w/o  Result Date: 02/18/2023 CLINICAL DATA:  Generalized weakness. Unable to ambulate as well as normal. Melanoma excision with sentinel lymph node biopsy on the left on 02/15/2023. PE suspected. Abdominal aortic aneurysm follow-up. EXAM: CT ANGIOGRAPHY CHEST, ABDOMEN AND PELVIS TECHNIQUE: Non-contrast CT of the chest was initially obtained. Multidetector CT imaging through the chest, abdomen and pelvis was performed using the standard protocol during bolus administration of intravenous contrast. Multiplanar reconstructed images and MIPs were obtained and reviewed to evaluate the vascular anatomy. RADIATION DOSE REDUCTION: This exam was performed according to the departmental dose-optimization program which includes automated exposure control, adjustment of the mA and/or kV according to patient size and/or use of iterative reconstruction technique. CONTRAST:  OMNIPAQUE IOHEXOL 350 MG/ML SOLN COMPARISON:  Chest radiograph 02/17/2023; PET/CT 12/28/2022; CT abdomen and pelvis 10/17/2022; CT chest abdomen and pelvis 10/10/2022 FINDINGS: CTA CHEST FINDINGS Cardiovascular: Negative for acute pulmonary embolism. Normal caliber thoracic aorta without dissection. Coronary artery and aortic atherosclerotic calcification. No pericardial effusion. Right PICC tip in the low SVC. Mediastinum/Nodes: No thoracic adenopathy. Unremarkable trachea and esophagus. Lungs/Pleura: Bronchial wall thickening and scattered mucous plugging greatest in the right lower lobe. Bilateral lower lung  bronchiolectasis. Scattered centrilobular and budding tree micro nodules greatest in the lingula. Decreased size of the thick-walled cavitary mass in the lingula compared with PET/CT 12/28/2022. This measures 3.2 x 1.7 cm today, previously 3.2 x 2.5 cm. Small right pleural effusion and associated atelectasis. No pneumothorax. Musculoskeletal: Subcutaneous emphysema in the anterolateral left chest wall. Loosely organized left axillary postprocedural hematoma/seroma measuring 4.9 x 2.2 x 4.6 cm related to recent axillary node biopsy. Thoracic kyphosis. No acute fracture. Fused anterior enthesophytes  throughout the cervical and thoracic spine. Review of the MIP images confirms the above findings. CTA ABDOMEN AND PELVIS FINDINGS VASCULAR Aorta: Infrarenal abdominal aortic aneurysm again measuring up to 5.0 cm in greatest diameter. Aortic calcified atherosclerosis. No dissection. Celiac: No hemodynamically significant stenosis, aneurysm, or dissection. SMA: No hemodynamically significant stenosis, aneurysm, or dissection. Renals: No hemodynamically significant stenosis, aneurysm, or dissection. IMA: Patent. Inflow: Calcified atherosclerosis without significant stenosis. No aneurysm or dissection. Veins: No obvious venous abnormality within the limitations of this arterial phase study. Review of the MIP images confirms the above findings. NON-VASCULAR Hepatobiliary: Cholecystectomy. No focal hepatic lesion. Unchanged biliary prominence. Pancreas: Fatty atrophy.  No acute abnormality. Spleen: Unremarkable. Adrenals/Urinary Tract: Stable adrenal glands. No urinary calculi or hydronephrosis. Unchanged 1.4 cm cystic lesion in the posterior right kidney. Unremarkable bladder. Stomach/Bowel: Normal caliber large and small bowel. Colonic diverticulosis without diverticulitis. Stomach is within normal limits. Normal appendix. Lymphatic: No lymphadenopathy. Reproductive: Unremarkable. Other: No free intraperitoneal fluid or air.  Musculoskeletal: Demineralization. Bilateral femoral head AVN. Fused anterior syndesmophytes in the lumbar spine. Review of the MIP images confirms the above findings. IMPRESSION: 1. Negative for acute pulmonary embolism. 2. Infrarenal abdominal aortic aneurysm again measuring up to 5.0 cm in greatest diameter. Recommend follow-up CT/MR every 6 months and vascular consultation if not obtained. This recommendation follows ACR consensus guidelines: White Paper of the ACR Incidental Findings Committee II on Vascular Findings. J Am Coll Radiol 2013; 10:789-794. 3. Decreased size of the cavitary lesion in the lingula since PET/CT 12/28/2022. 4. Pulmonary findings compatible with infectious/inflammatory bronchiolitis. 5. Small right pleural effusion and associated atelectasis. 6. Loosely organized left axillary postprocedural hematoma/seroma measuring 4.9 x 2.2 x 4.6 cm related to recent axillary node biopsy. 7. 1.4 cm cystic lesion in the posterior right kidney. If clinically appropriate, renal mass protocol MRI could be obtained for further evaluation. Aortic Atherosclerosis (ICD10-I70.0). Electronically Signed   By: Minerva Fester M.D.   On: 02/18/2023 01:03   DG Chest Port 1 View  Result Date: 02/17/2023 CLINICAL DATA:  Hypoxia. EXAM: PORTABLE CHEST 1 VIEW COMPARISON:  Chest radiograph dated 01/11/2023. FINDINGS: The heart size and mediastinal contours are within normal limits. There is mild right basilar atelectasis/airspace disease. A small right pleural effusion may contribute. Left interstitial and airspace opacities in the left mid lung appear similar to prior exam and may reflect an incompletely resolved cavitary lesion. No left pleural effusion. No pneumothorax on either side. A right upper extremity peripherally inserted central venous catheter tip overlies the superior cavoatrial junction. Degenerative changes are seen in the spine. IMPRESSION: 1. Mild right basilar atelectasis/airspace disease. A small  right pleural effusion may contribute. 2. Left interstitial and airspace opacities in the left mid lung appear similar to prior exam and may reflect an incompletely resolved cavitary lesion. Electronically Signed   By: Romona Curls M.D.   On: 02/17/2023 20:08    Labs:  CBC: Recent Labs    02/18/23 1002 03/05/23 1217 03/06/23 0430 03/07/23 0629  WBC 6.3 4.8 3.5* 5.5  HGB 9.1* 10.1* 10.1* 9.5*  HCT 31.2* 33.3* 34.2* 31.2*  PLT 122* 253 304 316    COAGS: Recent Labs    10/17/22 0141  INR 1.1  APTT 23*    BMP: Recent Labs    03/06/23 0430 03/07/23 0629 03/08/23 0303 03/09/23 0607  NA 138 140 137 138  K 4.8 3.9 4.3 4.3  CL 104 105 100 103  CO2 27 26 29 29   GLUCOSE 139* 112*  120* 102*  BUN 18 27* 29* 23  CALCIUM 8.5* 8.4* 8.7* 8.4*  CREATININE 1.44* 1.42* 1.28* 1.00  GFRNONAA 48* 48* 55* >60    LIVER FUNCTION TESTS: Recent Labs    10/18/22 0431 03/05/23 1217 03/06/23 0430 03/07/23 0629  BILITOT 1.3* 0.7 0.5 0.5  AST 24 25 16 16   ALT 26 8 9 9   ALKPHOS 41 58 63 55  PROT 5.1* 6.4* 7.5 6.5  ALBUMIN 2.6* 3.0* 3.1* 2.9*    TUMOR MARKERS: No results for input(s): "AFPTM", "CEA", "CA199", "CHROMGRNA" in the last 8760 hours.  Assessment and Plan:  Aaron Gibbs is an 88 yo male being seen today in relation to recurrent right pleural effusion. Patient will be discharged with home hospice and hospice team will be managing PleurX catheter as an outpatient. Case was reviewed and approved by Dr Loreta Ave and is scheduled to proceed on 03/09/23 with Dr Juliette Alcide. Patient is NPO.  Risks and benefits discussed with the patient/ family members including bleeding, infection, damage to adjacent structures, development of infection which can be life threatening, malfunctioning of the catheter which may require repair/replacement.   All questions were answered, there is an agreement to proceed.  Consent signed and in chart.    Thank you for this interesting consult.  I greatly  enjoyed meeting Aaron Gibbs and look forward to participating in their care.  A copy of this report was sent to the requesting provider on this date.  Electronically Signed: Kennieth Francois, PA-C 03/09/2023, 9:47 AM   I spent a total of 40 Minutes  in face to face in clinical consultation, greater than 50% of which was counseling/coordinating care for recurrent right pleural effusion.

## 2023-03-09 NOTE — Plan of Care (Signed)
  Problem: Education: Goal: Knowledge of condition and prescribed therapy will improve Outcome: Progressing   Problem: Physical Regulation: Goal: Complications related to the disease process, condition or treatment will be avoided or minimized Outcome: Progressing   Problem: Activity: Goal: Ability to tolerate increased activity will improve Outcome: Progressing   Problem: Clinical Measurements: Goal: Ability to maintain a body temperature in the normal range will improve Outcome: Progressing

## 2023-03-10 DIAGNOSIS — J90 Pleural effusion, not elsewhere classified: Secondary | ICD-10-CM | POA: Diagnosis not present

## 2023-03-10 DIAGNOSIS — C779 Secondary and unspecified malignant neoplasm of lymph node, unspecified: Secondary | ICD-10-CM | POA: Diagnosis not present

## 2023-03-10 DIAGNOSIS — J441 Chronic obstructive pulmonary disease with (acute) exacerbation: Secondary | ICD-10-CM | POA: Diagnosis not present

## 2023-03-10 DIAGNOSIS — I5032 Chronic diastolic (congestive) heart failure: Secondary | ICD-10-CM | POA: Diagnosis not present

## 2023-03-10 LAB — BODY FLUID CULTURE W GRAM STAIN: Culture: NO GROWTH

## 2023-03-10 LAB — STREP PNEUMONIAE URINARY ANTIGEN: Strep Pneumo Urinary Antigen: NEGATIVE

## 2023-03-10 MED ORDER — ADULT MULTIVITAMIN W/MINERALS CH: 1.0000 | ORAL_TABLET | Freq: Every day | ORAL | Status: DC

## 2023-03-10 MED ORDER — LORAZEPAM 1 MG PO TABS: 1.0000 mg | ORAL_TABLET | Freq: Three times a day (TID) | ORAL | 0 refills | Status: DC | PRN

## 2023-03-10 MED ORDER — AMLODIPINE BESYLATE 10 MG PO TABS
10.0000 mg | ORAL_TABLET | Freq: Every day | ORAL | Status: DC
  Administered 2023-03-10: 10 mg via ORAL
  Filled 2023-03-10: qty 1

## 2023-03-10 MED ORDER — ENSURE ENLIVE PO LIQD: 237.0000 mL | Freq: Three times a day (TID) | ORAL | Status: DC

## 2023-03-10 MED ORDER — OXYCODONE HCL 5 MG PO TABS: 5.0000 mg | ORAL_TABLET | Freq: Four times a day (QID) | ORAL | 0 refills | Status: DC | PRN

## 2023-03-10 MED ORDER — CARVEDILOL 25 MG PO TABS: 25.0000 mg | ORAL_TABLET | Freq: Two times a day (BID) | ORAL | Status: DC

## 2023-03-10 MED ORDER — ONDANSETRON HCL 4 MG PO TABS: 4.0000 mg | ORAL_TABLET | Freq: Four times a day (QID) | ORAL | 0 refills | Status: DC | PRN

## 2023-03-10 NOTE — Discharge Summary (Signed)
Physician Discharge Summary   Patient: Aaron Gibbs MRN: [id number] DOB: [birth year]  Admit date:     03/05/2023  Discharge date: 03/10/2023  Discharge Physician: Pennie Banter   PCP: Smitty Cords, DO   Recommendations at discharge:  {Tip this will not be part of the note when signed- Example include specific recommendations for outpatient follow-up, pending tests to follow-up on. 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 [facility] Course: Aaron Gibbs is a 88 y.o. 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   They presented from home to the ED on 03/05/2023 with acute SOB x 2-3 days. Was recently discharged from hospital stay on 9/5 after receiving care for ventricular tachycardia. No oxygen requirement at baseline.    In the ED, it was found that they had Tmax nine 9.2, heart rate 60s, respirations stable, blood pressure 100s to 150s over 70s. Satting in the mid 90s on 4 L.  Significant findings included White count 4.8, hemoglobin 10.1, platelets 253, creatinine 1.31.  Chest x-ray with interval development of large right pleural effusion. No patchy opacities in the left midlung and lower lung suspicious for pneumonia.   They were initially treated with  amiodarone, lasix, CTX, doxycycline.    Patient was admitted to medicine service for further workup and management of acute hypoxic respiratory failure as outlined in detail below.  9/17 -- thoracentesis with 500 cc fluid removed 918 -- qualifies for home oxygen 2 L/min due to ambulatory desaturation to 87% on room air 9/20 -- Pleurx cathter placed  Plan is for discharge home with hospice when medically stable / improved.  Assessment and Plan: Acute respiratory failure with hypoxia (HCC) Decompensated respiratory status now requiring 4 L nasal cannula with noted large R pleural effusion and L sided PNA  Afebrile, white count 5 Will place on empiric Rocephin and Doxy for infectious coverage in setting of azithromycin allergy. 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 Continue on doxycycline. Management per onco   Chronic heart failure with preserved ejection fraction (HCC) 2D echo September 2024 with EF 55 to 60% Appears fairly euvolemic Monitor volume status Reassess if any potential pleural effusion on CT scan Monitor  COPD (chronic obstructive pulmonary disease) (HCC) Positive trace cough and wheezing in the setting of acute respiratory failure with hypoxia w/ large R pleural effusion and PNA on CXR  Will place on steroids as well as DuoNebs  Monitor  Pleural effusion Noted large right pleural effusion on chest x-ray in setting of acute respiratory failure with hypoxia now requiring 4 L Will formally consult IR for thoracentesis Patient does report prior history of this in the past Monitor  Infrarenal abdominal aortic aneurysm (AAA) without rupture (HCC)  Physician Discharge Summary   Patient: Aaron Gibbs MRN: [id number] DOB: [birth year]  Admit date:     03/05/2023  Discharge date: 03/10/2023  Discharge Physician: Pennie Banter   PCP: Smitty Cords, DO   Recommendations at discharge:  {Tip this will not be part of the note when signed- Example include specific recommendations for outpatient follow-up, pending tests to follow-up on. 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 [facility] Course: Aaron Gibbs is a 88 y.o. 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   They presented from home to the ED on 03/05/2023 with acute SOB x 2-3 days. Was recently discharged from hospital stay on 9/5 after receiving care for ventricular tachycardia. No oxygen requirement at baseline.    In the ED, it was found that they had Tmax nine 9.2, heart rate 60s, respirations stable, blood pressure 100s to 150s over 70s. Satting in the mid 90s on 4 L.  Significant findings included White count 4.8, hemoglobin 10.1, platelets 253, creatinine 1.31.  Chest x-ray with interval development of large right pleural effusion. No patchy opacities in the left midlung and lower lung suspicious for pneumonia.   They were initially treated with  amiodarone, lasix, CTX, doxycycline.    Patient was admitted to medicine service for further workup and management of acute hypoxic respiratory failure as outlined in detail below.  9/17 -- thoracentesis with 500 cc fluid removed 918 -- qualifies for home oxygen 2 L/min due to ambulatory desaturation to 87% on room air 9/20 -- Pleurx cathter placed  Plan is for discharge home with hospice when medically stable / improved.  Assessment and Plan: Acute respiratory failure with hypoxia (HCC) Decompensated respiratory status now requiring 4 L nasal cannula with noted large R pleural effusion and L sided PNA  Afebrile, white count 5 Will place on empiric Rocephin and Doxy for infectious coverage in setting of azithromycin allergy. 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 Continue on doxycycline. Management per onco   Chronic heart failure with preserved ejection fraction (HCC) 2D echo September 2024 with EF 55 to 60% Appears fairly euvolemic Monitor volume status Reassess if any potential pleural effusion on CT scan Monitor  COPD (chronic obstructive pulmonary disease) (HCC) Positive trace cough and wheezing in the setting of acute respiratory failure with hypoxia w/ large R pleural effusion and PNA on CXR  Will place on steroids as well as DuoNebs  Monitor  Pleural effusion Noted large right pleural effusion on chest x-ray in setting of acute respiratory failure with hypoxia now requiring 4 L Will formally consult IR for thoracentesis Patient does report prior history of this in the past Monitor  Infrarenal abdominal aortic aneurysm (AAA) without rupture (HCC)  dens fracture. Other neck: No acute abnormality on limited assessment. Upper chest: Partially imaged right pleural effusion with overlying opacities, better characterized on recent CT chest. Review of the MIP images confirms the above findings CTA HEAD FINDINGS Anterior circulation: Bilateral intracranial ICAs, MCAs, and ACAs are patent proximally. Posterior circulation: Faint V3/V4 reconstitution with new/interval severe stenosis or occlusion of the intradural right vertebral artery. Left vertebral artery remains patent. The basilar artery and bilateral posterior cerebral arteries are patent without proximal high-grade stenosis. Venous sinuses: As permitted by contrast timing, patent. Review of the MIP images confirms the above findings IMPRESSION: 1. Chronic right V2 vertebral artery occlusion. There is faint V3/V4 reconstitution with new/interval severe stenosis or occlusion of the intradural right vertebral artery. Left vertebral artery remains patent. 2. Chronic C2 fracture. 3. Partially imaged right pleural effusion with overlying opacities, better characterized on recent CT chest. 4. Limited study due to patient positioning. Electronically Signed   By: Feliberto Harts M.D.   On: 02/19/2023 13:12   ECHOCARDIOGRAM COMPLETE  Result Date: 02/19/2023    ECHOCARDIOGRAM REPORT   Patient Name:   TYVEON STAUDACHER Date of Exam: 02/18/2023 Medical Rec #:  [id number]        Height:       71.0 in Accession #:    [id number]       Weight:       213.8 lb Date of Birth:  Jun 08, 1935        BSA:          2.169 m Patient Age:    87 years         BP:           141/87 mmHg Patient Gender: M                HR:           68 bpm. Exam Location:  [facility] Procedure: 2D Echo, Cardiac Doppler and Color Doppler Indications:     Syncope  R55  History:         Patient has no prior history of Echocardiogram examinations.                  CHF; Risk Factors:Hypertension.  Sonographer:     Neysa Bonito Roar Referring Phys:  [id number] Andris Baumann Diagnosing Phys: Yvonne Kendall MD IMPRESSIONS  1. Left ventricular ejection fraction, by estimation, is 55 to 60%. The left ventricle has normal function. The left ventricle has no regional wall motion abnormalities. Left ventricular diastolic parameters are indeterminate.  2. Right ventricular systolic function is normal. The right ventricular size is normal. Tricuspid regurgitation signal is inadequate for assessing PA pressure.  3. Left atrial size was moderately dilated.  4. The mitral valve is grossly normal. Trivial mitral valve regurgitation. No evidence of mitral stenosis.  5. The aortic valve was not well visualized. There is mild calcification of the aortic valve. There is mild thickening of the aortic valve. Aortic valve regurgitation is mild to moderate. Aortic valve sclerosis/calcification is present, without any evidence of aortic stenosis.  6. Aortic dilatation noted. There is borderline dilatation of the aortic root, measuring 40 mm. FINDINGS  Left Ventricle: Left ventricular ejection fraction, by estimation, is 55 to 60%. The left ventricle has normal function. The left ventricle has no regional wall motion abnormalities. The left ventricular internal cavity size was normal in size. There is  borderline left ventricular hypertrophy. Left ventricular diastolic parameters are indeterminate. Right Ventricle: The right ventricular size is  Physician Discharge Summary   Patient: Aaron Gibbs MRN: [id number] DOB: [birth year]  Admit date:     03/05/2023  Discharge date: 03/10/2023  Discharge Physician: Pennie Banter   PCP: Smitty Cords, DO   Recommendations at discharge:  {Tip this will not be part of the note when signed- Example include specific recommendations for outpatient follow-up, pending tests to follow-up on. 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 [facility] Course: Aaron Gibbs is a 88 y.o. 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   They presented from home to the ED on 03/05/2023 with acute SOB x 2-3 days. Was recently discharged from hospital stay on 9/5 after receiving care for ventricular tachycardia. No oxygen requirement at baseline.    In the ED, it was found that they had Tmax nine 9.2, heart rate 60s, respirations stable, blood pressure 100s to 150s over 70s. Satting in the mid 90s on 4 L.  Significant findings included White count 4.8, hemoglobin 10.1, platelets 253, creatinine 1.31.  Chest x-ray with interval development of large right pleural effusion. No patchy opacities in the left midlung and lower lung suspicious for pneumonia.   They were initially treated with  amiodarone, lasix, CTX, doxycycline.    Patient was admitted to medicine service for further workup and management of acute hypoxic respiratory failure as outlined in detail below.  9/17 -- thoracentesis with 500 cc fluid removed 918 -- qualifies for home oxygen 2 L/min due to ambulatory desaturation to 87% on room air 9/20 -- Pleurx cathter placed  Plan is for discharge home with hospice when medically stable / improved.  Assessment and Plan: Acute respiratory failure with hypoxia (HCC) Decompensated respiratory status now requiring 4 L nasal cannula with noted large R pleural effusion and L sided PNA  Afebrile, white count 5 Will place on empiric Rocephin and Doxy for infectious coverage in setting of azithromycin allergy. 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 Continue on doxycycline. Management per onco   Chronic heart failure with preserved ejection fraction (HCC) 2D echo September 2024 with EF 55 to 60% Appears fairly euvolemic Monitor volume status Reassess if any potential pleural effusion on CT scan Monitor  COPD (chronic obstructive pulmonary disease) (HCC) Positive trace cough and wheezing in the setting of acute respiratory failure with hypoxia w/ large R pleural effusion and PNA on CXR  Will place on steroids as well as DuoNebs  Monitor  Pleural effusion Noted large right pleural effusion on chest x-ray in setting of acute respiratory failure with hypoxia now requiring 4 L Will formally consult IR for thoracentesis Patient does report prior history of this in the past Monitor  Infrarenal abdominal aortic aneurysm (AAA) without rupture (HCC)  dens fracture. Other neck: No acute abnormality on limited assessment. Upper chest: Partially imaged right pleural effusion with overlying opacities, better characterized on recent CT chest. Review of the MIP images confirms the above findings CTA HEAD FINDINGS Anterior circulation: Bilateral intracranial ICAs, MCAs, and ACAs are patent proximally. Posterior circulation: Faint V3/V4 reconstitution with new/interval severe stenosis or occlusion of the intradural right vertebral artery. Left vertebral artery remains patent. The basilar artery and bilateral posterior cerebral arteries are patent without proximal high-grade stenosis. Venous sinuses: As permitted by contrast timing, patent. Review of the MIP images confirms the above findings IMPRESSION: 1. Chronic right V2 vertebral artery occlusion. There is faint V3/V4 reconstitution with new/interval severe stenosis or occlusion of the intradural right vertebral artery. Left vertebral artery remains patent. 2. Chronic C2 fracture. 3. Partially imaged right pleural effusion with overlying opacities, better characterized on recent CT chest. 4. Limited study due to patient positioning. Electronically Signed   By: Feliberto Harts M.D.   On: 02/19/2023 13:12   ECHOCARDIOGRAM COMPLETE  Result Date: 02/19/2023    ECHOCARDIOGRAM REPORT   Patient Name:   TYVEON STAUDACHER Date of Exam: 02/18/2023 Medical Rec #:  [id number]        Height:       71.0 in Accession #:    [id number]       Weight:       213.8 lb Date of Birth:  Jun 08, 1935        BSA:          2.169 m Patient Age:    87 years         BP:           141/87 mmHg Patient Gender: M                HR:           68 bpm. Exam Location:  [facility] Procedure: 2D Echo, Cardiac Doppler and Color Doppler Indications:     Syncope  R55  History:         Patient has no prior history of Echocardiogram examinations.                  CHF; Risk Factors:Hypertension.  Sonographer:     Neysa Bonito Roar Referring Phys:  [id number] Andris Baumann Diagnosing Phys: Yvonne Kendall MD IMPRESSIONS  1. Left ventricular ejection fraction, by estimation, is 55 to 60%. The left ventricle has normal function. The left ventricle has no regional wall motion abnormalities. Left ventricular diastolic parameters are indeterminate.  2. Right ventricular systolic function is normal. The right ventricular size is normal. Tricuspid regurgitation signal is inadequate for assessing PA pressure.  3. Left atrial size was moderately dilated.  4. The mitral valve is grossly normal. Trivial mitral valve regurgitation. No evidence of mitral stenosis.  5. The aortic valve was not well visualized. There is mild calcification of the aortic valve. There is mild thickening of the aortic valve. Aortic valve regurgitation is mild to moderate. Aortic valve sclerosis/calcification is present, without any evidence of aortic stenosis.  6. Aortic dilatation noted. There is borderline dilatation of the aortic root, measuring 40 mm. FINDINGS  Left Ventricle: Left ventricular ejection fraction, by estimation, is 55 to 60%. The left ventricle has normal function. The left ventricle has no regional wall motion abnormalities. The left ventricular internal cavity size was normal in size. There is  borderline left ventricular hypertrophy. Left ventricular diastolic parameters are indeterminate. Right Ventricle: The right ventricular size is  dens fracture. Other neck: No acute abnormality on limited assessment. Upper chest: Partially imaged right pleural effusion with overlying opacities, better characterized on recent CT chest. Review of the MIP images confirms the above findings CTA HEAD FINDINGS Anterior circulation: Bilateral intracranial ICAs, MCAs, and ACAs are patent proximally. Posterior circulation: Faint V3/V4 reconstitution with new/interval severe stenosis or occlusion of the intradural right vertebral artery. Left vertebral artery remains patent. The basilar artery and bilateral posterior cerebral arteries are patent without proximal high-grade stenosis. Venous sinuses: As permitted by contrast timing, patent. Review of the MIP images confirms the above findings IMPRESSION: 1. Chronic right V2 vertebral artery occlusion. There is faint V3/V4 reconstitution with new/interval severe stenosis or occlusion of the intradural right vertebral artery. Left vertebral artery remains patent. 2. Chronic C2 fracture. 3. Partially imaged right pleural effusion with overlying opacities, better characterized on recent CT chest. 4. Limited study due to patient positioning. Electronically Signed   By: Feliberto Harts M.D.   On: 02/19/2023 13:12   ECHOCARDIOGRAM COMPLETE  Result Date: 02/19/2023    ECHOCARDIOGRAM REPORT   Patient Name:   TYVEON STAUDACHER Date of Exam: 02/18/2023 Medical Rec #:  [id number]        Height:       71.0 in Accession #:    [id number]       Weight:       213.8 lb Date of Birth:  Jun 08, 1935        BSA:          2.169 m Patient Age:    87 years         BP:           141/87 mmHg Patient Gender: M                HR:           68 bpm. Exam Location:  [facility] Procedure: 2D Echo, Cardiac Doppler and Color Doppler Indications:     Syncope  R55  History:         Patient has no prior history of Echocardiogram examinations.                  CHF; Risk Factors:Hypertension.  Sonographer:     Neysa Bonito Roar Referring Phys:  [id number] Andris Baumann Diagnosing Phys: Yvonne Kendall MD IMPRESSIONS  1. Left ventricular ejection fraction, by estimation, is 55 to 60%. The left ventricle has normal function. The left ventricle has no regional wall motion abnormalities. Left ventricular diastolic parameters are indeterminate.  2. Right ventricular systolic function is normal. The right ventricular size is normal. Tricuspid regurgitation signal is inadequate for assessing PA pressure.  3. Left atrial size was moderately dilated.  4. The mitral valve is grossly normal. Trivial mitral valve regurgitation. No evidence of mitral stenosis.  5. The aortic valve was not well visualized. There is mild calcification of the aortic valve. There is mild thickening of the aortic valve. Aortic valve regurgitation is mild to moderate. Aortic valve sclerosis/calcification is present, without any evidence of aortic stenosis.  6. Aortic dilatation noted. There is borderline dilatation of the aortic root, measuring 40 mm. FINDINGS  Left Ventricle: Left ventricular ejection fraction, by estimation, is 55 to 60%. The left ventricle has normal function. The left ventricle has no regional wall motion abnormalities. The left ventricular internal cavity size was normal in size. There is  borderline left ventricular hypertrophy. Left ventricular diastolic parameters are indeterminate. Right Ventricle: The right ventricular size is  Physician Discharge Summary   Patient: Aaron Gibbs MRN: [id number] DOB: [birth year]  Admit date:     03/05/2023  Discharge date: 03/10/2023  Discharge Physician: Pennie Banter   PCP: Smitty Cords, DO   Recommendations at discharge:  {Tip this will not be part of the note when signed- Example include specific recommendations for outpatient follow-up, pending tests to follow-up on. 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 [facility] Course: Aaron Gibbs is a 88 y.o. 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   They presented from home to the ED on 03/05/2023 with acute SOB x 2-3 days. Was recently discharged from hospital stay on 9/5 after receiving care for ventricular tachycardia. No oxygen requirement at baseline.    In the ED, it was found that they had Tmax nine 9.2, heart rate 60s, respirations stable, blood pressure 100s to 150s over 70s. Satting in the mid 90s on 4 L.  Significant findings included White count 4.8, hemoglobin 10.1, platelets 253, creatinine 1.31.  Chest x-ray with interval development of large right pleural effusion. No patchy opacities in the left midlung and lower lung suspicious for pneumonia.   They were initially treated with  amiodarone, lasix, CTX, doxycycline.    Patient was admitted to medicine service for further workup and management of acute hypoxic respiratory failure as outlined in detail below.  9/17 -- thoracentesis with 500 cc fluid removed 918 -- qualifies for home oxygen 2 L/min due to ambulatory desaturation to 87% on room air 9/20 -- Pleurx cathter placed  Plan is for discharge home with hospice when medically stable / improved.  Assessment and Plan: Acute respiratory failure with hypoxia (HCC) Decompensated respiratory status now requiring 4 L nasal cannula with noted large R pleural effusion and L sided PNA  Afebrile, white count 5 Will place on empiric Rocephin and Doxy for infectious coverage in setting of azithromycin allergy. 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 Continue on doxycycline. Management per onco   Chronic heart failure with preserved ejection fraction (HCC) 2D echo September 2024 with EF 55 to 60% Appears fairly euvolemic Monitor volume status Reassess if any potential pleural effusion on CT scan Monitor  COPD (chronic obstructive pulmonary disease) (HCC) Positive trace cough and wheezing in the setting of acute respiratory failure with hypoxia w/ large R pleural effusion and PNA on CXR  Will place on steroids as well as DuoNebs  Monitor  Pleural effusion Noted large right pleural effusion on chest x-ray in setting of acute respiratory failure with hypoxia now requiring 4 L Will formally consult IR for thoracentesis Patient does report prior history of this in the past Monitor  Infrarenal abdominal aortic aneurysm (AAA) without rupture (HCC)  Physician Discharge Summary   Patient: Aaron Gibbs MRN: [id number] DOB: [birth year]  Admit date:     03/05/2023  Discharge date: 03/10/2023  Discharge Physician: Pennie Banter   PCP: Smitty Cords, DO   Recommendations at discharge:  {Tip this will not be part of the note when signed- Example include specific recommendations for outpatient follow-up, pending tests to follow-up on. 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 [facility] Course: Aaron Gibbs is a 88 y.o. 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   They presented from home to the ED on 03/05/2023 with acute SOB x 2-3 days. Was recently discharged from hospital stay on 9/5 after receiving care for ventricular tachycardia. No oxygen requirement at baseline.    In the ED, it was found that they had Tmax nine 9.2, heart rate 60s, respirations stable, blood pressure 100s to 150s over 70s. Satting in the mid 90s on 4 L.  Significant findings included White count 4.8, hemoglobin 10.1, platelets 253, creatinine 1.31.  Chest x-ray with interval development of large right pleural effusion. No patchy opacities in the left midlung and lower lung suspicious for pneumonia.   They were initially treated with  amiodarone, lasix, CTX, doxycycline.    Patient was admitted to medicine service for further workup and management of acute hypoxic respiratory failure as outlined in detail below.  9/17 -- thoracentesis with 500 cc fluid removed 918 -- qualifies for home oxygen 2 L/min due to ambulatory desaturation to 87% on room air 9/20 -- Pleurx cathter placed  Plan is for discharge home with hospice when medically stable / improved.  Assessment and Plan: Acute respiratory failure with hypoxia (HCC) Decompensated respiratory status now requiring 4 L nasal cannula with noted large R pleural effusion and L sided PNA  Afebrile, white count 5 Will place on empiric Rocephin and Doxy for infectious coverage in setting of azithromycin allergy. 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 Continue on doxycycline. Management per onco   Chronic heart failure with preserved ejection fraction (HCC) 2D echo September 2024 with EF 55 to 60% Appears fairly euvolemic Monitor volume status Reassess if any potential pleural effusion on CT scan Monitor  COPD (chronic obstructive pulmonary disease) (HCC) Positive trace cough and wheezing in the setting of acute respiratory failure with hypoxia w/ large R pleural effusion and PNA on CXR  Will place on steroids as well as DuoNebs  Monitor  Pleural effusion Noted large right pleural effusion on chest x-ray in setting of acute respiratory failure with hypoxia now requiring 4 L Will formally consult IR for thoracentesis Patient does report prior history of this in the past Monitor  Infrarenal abdominal aortic aneurysm (AAA) without rupture (HCC)  Physician Discharge Summary   Patient: Aaron Gibbs MRN: [id number] DOB: [birth year]  Admit date:     03/05/2023  Discharge date: 03/10/2023  Discharge Physician: Pennie Banter   PCP: Smitty Cords, DO   Recommendations at discharge:  {Tip this will not be part of the note when signed- Example include specific recommendations for outpatient follow-up, pending tests to follow-up on. 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 [facility] Course: Aaron Gibbs is a 88 y.o. 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   They presented from home to the ED on 03/05/2023 with acute SOB x 2-3 days. Was recently discharged from hospital stay on 9/5 after receiving care for ventricular tachycardia. No oxygen requirement at baseline.    In the ED, it was found that they had Tmax nine 9.2, heart rate 60s, respirations stable, blood pressure 100s to 150s over 70s. Satting in the mid 90s on 4 L.  Significant findings included White count 4.8, hemoglobin 10.1, platelets 253, creatinine 1.31.  Chest x-ray with interval development of large right pleural effusion. No patchy opacities in the left midlung and lower lung suspicious for pneumonia.   They were initially treated with  amiodarone, lasix, CTX, doxycycline.    Patient was admitted to medicine service for further workup and management of acute hypoxic respiratory failure as outlined in detail below.  9/17 -- thoracentesis with 500 cc fluid removed 918 -- qualifies for home oxygen 2 L/min due to ambulatory desaturation to 87% on room air 9/20 -- Pleurx cathter placed  Plan is for discharge home with hospice when medically stable / improved.  Assessment and Plan: Acute respiratory failure with hypoxia (HCC) Decompensated respiratory status now requiring 4 L nasal cannula with noted large R pleural effusion and L sided PNA  Afebrile, white count 5 Will place on empiric Rocephin and Doxy for infectious coverage in setting of azithromycin allergy. 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 Continue on doxycycline. Management per onco   Chronic heart failure with preserved ejection fraction (HCC) 2D echo September 2024 with EF 55 to 60% Appears fairly euvolemic Monitor volume status Reassess if any potential pleural effusion on CT scan Monitor  COPD (chronic obstructive pulmonary disease) (HCC) Positive trace cough and wheezing in the setting of acute respiratory failure with hypoxia w/ large R pleural effusion and PNA on CXR  Will place on steroids as well as DuoNebs  Monitor  Pleural effusion Noted large right pleural effusion on chest x-ray in setting of acute respiratory failure with hypoxia now requiring 4 L Will formally consult IR for thoracentesis Patient does report prior history of this in the past Monitor  Infrarenal abdominal aortic aneurysm (AAA) without rupture (HCC)  dens fracture. Other neck: No acute abnormality on limited assessment. Upper chest: Partially imaged right pleural effusion with overlying opacities, better characterized on recent CT chest. Review of the MIP images confirms the above findings CTA HEAD FINDINGS Anterior circulation: Bilateral intracranial ICAs, MCAs, and ACAs are patent proximally. Posterior circulation: Faint V3/V4 reconstitution with new/interval severe stenosis or occlusion of the intradural right vertebral artery. Left vertebral artery remains patent. The basilar artery and bilateral posterior cerebral arteries are patent without proximal high-grade stenosis. Venous sinuses: As permitted by contrast timing, patent. Review of the MIP images confirms the above findings IMPRESSION: 1. Chronic right V2 vertebral artery occlusion. There is faint V3/V4 reconstitution with new/interval severe stenosis or occlusion of the intradural right vertebral artery. Left vertebral artery remains patent. 2. Chronic C2 fracture. 3. Partially imaged right pleural effusion with overlying opacities, better characterized on recent CT chest. 4. Limited study due to patient positioning. Electronically Signed   By: Feliberto Harts M.D.   On: 02/19/2023 13:12   ECHOCARDIOGRAM COMPLETE  Result Date: 02/19/2023    ECHOCARDIOGRAM REPORT   Patient Name:   TYVEON STAUDACHER Date of Exam: 02/18/2023 Medical Rec #:  [id number]        Height:       71.0 in Accession #:    [id number]       Weight:       213.8 lb Date of Birth:  Jun 08, 1935        BSA:          2.169 m Patient Age:    87 years         BP:           141/87 mmHg Patient Gender: M                HR:           68 bpm. Exam Location:  [facility] Procedure: 2D Echo, Cardiac Doppler and Color Doppler Indications:     Syncope  R55  History:         Patient has no prior history of Echocardiogram examinations.                  CHF; Risk Factors:Hypertension.  Sonographer:     Neysa Bonito Roar Referring Phys:  [id number] Andris Baumann Diagnosing Phys: Yvonne Kendall MD IMPRESSIONS  1. Left ventricular ejection fraction, by estimation, is 55 to 60%. The left ventricle has normal function. The left ventricle has no regional wall motion abnormalities. Left ventricular diastolic parameters are indeterminate.  2. Right ventricular systolic function is normal. The right ventricular size is normal. Tricuspid regurgitation signal is inadequate for assessing PA pressure.  3. Left atrial size was moderately dilated.  4. The mitral valve is grossly normal. Trivial mitral valve regurgitation. No evidence of mitral stenosis.  5. The aortic valve was not well visualized. There is mild calcification of the aortic valve. There is mild thickening of the aortic valve. Aortic valve regurgitation is mild to moderate. Aortic valve sclerosis/calcification is present, without any evidence of aortic stenosis.  6. Aortic dilatation noted. There is borderline dilatation of the aortic root, measuring 40 mm. FINDINGS  Left Ventricle: Left ventricular ejection fraction, by estimation, is 55 to 60%. The left ventricle has normal function. The left ventricle has no regional wall motion abnormalities. The left ventricular internal cavity size was normal in size. There is  borderline left ventricular hypertrophy. Left ventricular diastolic parameters are indeterminate. Right Ventricle: The right ventricular size is

## 2023-03-10 NOTE — Plan of Care (Signed)
  Problem: Education: Goal: Knowledge of condition and prescribed therapy will improve Outcome: Adequate for Discharge   Problem: Cardiac: Goal: Will achieve and/or maintain adequate cardiac output Outcome: Adequate for Discharge   Problem: Physical Regulation: Goal: Complications related to the disease process, condition or treatment will be avoided or minimized Outcome: Adequate for Discharge   Problem: Activity: Goal: Ability to tolerate increased activity will improve Outcome: Adequate for Discharge   Problem: Clinical Measurements: Goal: Ability to maintain a body temperature in the normal range will improve Outcome: Adequate for Discharge   Problem: Respiratory: Goal: Ability to maintain adequate ventilation will improve Outcome: Adequate for Discharge Goal: Ability to maintain a clear airway will improve Outcome: Adequate for Discharge   Problem: Education: Goal: Knowledge of General Education information will improve Description: Including pain rating scale, medication(s)/side effects and non-pharmacologic comfort measures Outcome: Adequate for Discharge   Problem: Health Behavior/Discharge Planning: Goal: Ability to manage health-related needs will improve Outcome: Adequate for Discharge   Problem: Clinical Measurements: Goal: Ability to maintain clinical measurements within normal limits will improve Outcome: Adequate for Discharge Goal: Will remain free from infection Outcome: Adequate for Discharge Goal: Diagnostic test results will improve Outcome: Adequate for Discharge Goal: Respiratory complications will improve Outcome: Adequate for Discharge Goal: Cardiovascular complication will be avoided Outcome: Adequate for Discharge   Problem: Activity: Goal: Risk for activity intolerance will decrease Outcome: Adequate for Discharge   Problem: Nutrition: Goal: Adequate nutrition will be maintained Outcome: Adequate for Discharge   Problem: Coping: Goal:  Level of anxiety will decrease Outcome: Adequate for Discharge   Problem: Elimination: Goal: Will not experience complications related to bowel motility Outcome: Adequate for Discharge Goal: Will not experience complications related to urinary retention Outcome: Adequate for Discharge   Problem: Pain Managment: Goal: General experience of comfort will improve Outcome: Adequate for Discharge   Problem: Safety: Goal: Ability to remain free from injury will improve Outcome: Adequate for Discharge   Problem: Skin Integrity: Goal: Risk for impaired skin integrity will decrease Outcome: Adequate for Discharge    Pt ao x4, respirations even and unlabored. Pt has all belongings. Pt has received all dc instructions. Pt given 2 pleurex bottles for home. Pt being taken home by daughter. Pt taken down to lobby with staff via wheelchair

## 2023-03-10 NOTE — TOC Transition Note (Addendum)
Transition of Care [facility]) - CM/SW Discharge Note   Patient Details  Name: Aaron Gibbs MRN: [id number] Date of Birth: 11-Feb-1935  Transition of Care [facility]) CM/SW Contact:  Bing Quarry, RN Phone Number: 03/10/2023, 11:48 AM   Clinical Narrative: 03/10/23: Patient to be discharged today to home with hospice care via [facility]. Per provider, CM notes, and [facility] liaison notes from 03/09/23, everything should be in place. DME home oxygen delivered confirmed by [facility] liaison and patient has a Pleurx catheter placed 02/2023. [facility] referral team notified by 9/20 liaison of today's anticipated discharge.Signed DNR on chart per prior RN CM note of 02/2023. Secure chat to [facility] liaison to see about transportation needs or if already arranged.   Update: 109 pm Daughter will transport patient home on discharge per assigned treatment team RN.   Gabriel Cirri MSN RN CM  Transitions of Care Department [facility] [phone] Weekends Only      Final next level of care: Home w Hospice Care Barriers to Discharge: Barriers Resolved   Patient Goals and CMS Choice   Choice offered to / list presented to : Adult Children  Discharge Placement                         Discharge Plan and Services Additional resources added to the After Visit Summary for                  DME Arranged: Oxygen (By Marcell Anger. DME home oxygen delivery confirmed.) DME Agency: Other - Comment ([facility] home with hospice.)       HH Arranged:  ([facility] home with hospice providing/making arrangements.) HH Agency: [facility] ([facility].)        Social Determinants of Health (SDOH) Interventions SDOH Screenings   Food Insecurity: No Food Insecurity (03/10/2023)  Housing: Low Risk  (03/10/2023)  Transportation Needs: No Transportation Needs (03/10/2023)  Utilities: Not At Risk (03/10/2023)  Alcohol Screen: Low Risk  (02/02/2023)  Depression (PHQ2-9): Low Risk  (02/02/2023)  Recent Concern:  Depression (PHQ2-9) - Medium Risk (12/27/2022)  Financial Resource Strain: Low Risk  (07/13/2022)  Physical Activity: Inactive (07/13/2022)  Social Connections: Socially Isolated (07/13/2022)  Stress: Stress Concern Present (07/13/2022)  Tobacco Use: Medium Risk (03/05/2023)     Readmission Risk Interventions    02/20/2023    9:47 AM  Readmission Risk Prevention Plan  Transportation Screening Complete  Medication Review (RN Care Manager) Referral to Pharmacy  PCP or Specialist appointment within 3-5 days of discharge Complete  HRI or Home Care Consult Complete  SW Recovery Care/Counseling Consult Complete  Palliative Care Screening Not Applicable  Skilled Nursing Facility Not Applicable

## 2023-03-10 NOTE — Plan of Care (Signed)
Problem: Education: Goal: Knowledge of condition and prescribed therapy will improve Outcome: Progressing   Problem: Cardiac: Goal: Will achieve and/or maintain adequate cardiac output Outcome: Progressing   Problem: Physical Regulation: Goal: Complications related to the disease process, condition or treatment will be avoided or minimized Outcome: Progressing

## 2023-03-12 LAB — LEGIONELLA PNEUMOPHILA SEROGP 1 UR AG: L. pneumophila Serogp 1 Ur Ag: NEGATIVE

## 2023-03-12 NOTE — Progress Notes (Unsigned)
PROVIDER NOTE: Information contained herein reflects review and annotations entered in association with encounter. Interpretation of such information and data should be left to medically-trained personnel. Information provided to patient can be located elsewhere in the medical record under "Patient Instructions". Document created using STT-dictation technology, any transcriptional errors that may result from process are unintentional.    Patient: Aaron Gibbs  Service Category: E/M  Provider: Oswaldo Done, MD  DOB: [birth year]  DOS: 03/14/2023  Referring Provider: Saralyn Pilar *  MRN: [id number]  Specialty: Interventional Pain Management  PCP: Smitty Cords, DO  Type: Established Patient  Setting: Ambulatory outpatient    Location: Office  Delivery: Face-to-face     HPI  [name], a 88 y.o. year old male, is here today because of his No primary diagnosis found.. [name] primary complain today is No chief complaint on file.  Pertinent problems: [name] has Chronic pain syndrome; DISH (diffuse idiopathic skeletal hyperostosis); Osteoarthritis of multiple joints; Chronic knee pain (1ry area of Pain) (Bilateral) (L>R); Chronic low back pain (Bilateral) w/o sciatica; Somatic symptom disorder; Tricompartment osteoarthritis of knee (Left); Osteoarthritis of knee (Bilateral); Osteoarthritis of patellofemoral joint (Right); Neurogenic pain; Lateral meniscal tear, sequela (Left); Medial meniscal tear, sequela (Left); Patellar tendinosis (Right); Osteoarthritis of knee (Left); Abnormal MRI, knee (08/05/2018) (Left); Chronic knee pain (Left); Pain in right knee; Other chronic pain; C2 cervical fracture (HCC); 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 Pain Assessment: Severity of   is reported as a  /10. Location:    / . Onset:  . Quality:  . Timing:  . Modifying factor(s):  Marland Kitchen Vitals:  vitals were not taken for this visit.  BMI: Estimated body mass index is 31 kg/m as calculated from the following:   Height as of 03/06/23: 5\' 10"  (1.778 m).   Weight as of 03/10/23: 216 lb 0.8 oz (98 kg). Last encounter: 12/27/2022. Last procedure: 09/07/2022.  Reason for encounter: medication management. ***  RTCB:   Pharmacotherapy Assessment  Analgesic: No opioid analgesics prescribed by our practice.  Buprenorphine 10 mcg/h patch q. 7 days.  High risk for SUD.  History of suicidal attempts and noncompliance with medication intake. ED visits thought to be due to medication binging. MME/day: 0 mg/day.   Monitoring: [place] PMP: PDMP reviewed during this encounter.       Pharmacotherapy: No side-effects or adverse reactions reported. Compliance: No problems identified. Effectiveness: Clinically acceptable.  No notes on file  No results found for: "CBDTHCR" No results found for: "D8THCCBX" No results found for: "D9THCCBX"  UDS:  Summary  Date Value Ref Range Status  09/28/2022 Note  Final    Comment:    ==================================================================== [employer] Select 13 (MW) ==================================================================== Test                             Result       Flag       Units  Drug Present and Declared for Prescription Verification   Lorazepam                      714          EXPECTED   ng/mg creat    Source of lorazepam is a scheduled  prescription medication.  Drug Absent but Declared for Prescription Verification   Buprenorphine                  Not Detected UNEXPECTED ng/mg creat    Transdermal buprenorphine, as indicated in the declared medication    list, is not always detected even when used as  directed.  ==================================================================== Test                      Result    Flag   Units      Ref Range   Creatinine              108              mg/dL      >=62 ==================================================================== Declared Medications:  The flagging and interpretation on this report are based on the  following declared medications.  Unexpected results may arise from  inaccuracies in the declared medications.   **Note: The testing scope of this panel includes these medications:   Lorazepam (Ativan)   **Note: The testing scope of this panel does not include small to  moderate amounts of these reported medications:   Buprenorphine Patch (BuTrans)   **Note: The testing scope of this panel does not include the  following reported medications:   Acetaminophen (Tylenol)  Betamethasone (Lotrisone)  Bumetanide (Bumex)  Carvedilol (Coreg)  Clotrimazole (Lotrisone)  Diclofenac (Voltaren)  Dorzolamide  Duloxetine (Cymbalta)  Finasteride (Proscar)  Gabapentin (Neurontin)  Iron  Latanoprost (Xalatan)  Melatonin  Meloxicam (Mobic)  Mupirocin (Bactroban)  Nystatin (Mycostatin)  Olanzapine (Zyprexa)  Pantoprazole (Protonix)  Polyethylene Glycol  Tamsulosin (Flomax)  Timolol  Trazodone (Desyrel) ==================================================================== For clinical consultation, please call [phone]. ====================================================================       ROS  Constitutional: Denies any fever or chills Gastrointestinal: No reported hemesis, hematochezia, vomiting, or acute GI distress Musculoskeletal: Denies any acute onset joint swelling, redness, loss of ROM, or weakness Neurological: No reported episodes of acute onset apraxia, aphasia, dysarthria, agnosia, amnesia, paralysis, loss of coordination, or loss of consciousness  Medication Review  DULoxetine, Iron (Ferrous Sulfate),  LORazepam, Melatonin, OLANZapine, acetaminophen, albuterol, amiodarone, buprenorphine, carvedilol, diclofenac Sodium, dorzolamide-timolol, feeding supplement, finasteride, gabapentin, latanoprost, meloxicam, multivitamin with minerals, olmesartan, ondansetron, oxyCODONE, pantoprazole, polyethylene glycol, rosuvastatin, tamsulosin, and traZODone  History Review  Allergy: [name] is allergic to anthralin and azithromycin. Drug: [name]  reports no history of drug use. Alcohol:  reports that he does not currently use alcohol. Tobacco:  reports that he has quit smoking. His smoking use included cigarettes. He has quit using smokeless tobacco.  His smokeless tobacco use included chew. Social: [name]  reports that he has quit smoking. His smoking use included cigarettes. He has quit using smokeless tobacco.  His smokeless tobacco use included chew. He reports that he does not currently use alcohol. He reports that he does not use drugs. Medical:  has a past medical history of (HFpEF) heart failure with preserved ejection fraction (HCC), AAA (abdominal aortic aneurysm) without rupture (HCC), Ankylosing spondylitis lumbar region [facility]), Anxiety, C2 cervical fracture (HCC) (09/2022), Chronic pain after traumatic injury (09/2022), COPD (chronic obstructive pulmonary disease) (HCC), DISH (diffuse idiopathic skeletal hyperostosis), GERD (gastroesophageal reflux disease), Glaucoma, History of alcohol abuse, History of tobacco abuse, Hypertension, In-transit metastasis from malignant melanoma of skin (HCC), Major depressive disorder, recurrent episode, severe (HCC), Melanoma in situ of left upper arm (HCC), Multiple falls, Opioid dependence (HCC), Osteoarthritis, Retinitis pigmentosa of both eyes, Sensorineural hearing loss (  SNHL) of both ears, and Suicide attempt (HCC) (05/13/18) (06/26/2018). Surgical: [name]  has a past surgical history that includes Cholecystectomy; Transurethral resection of  prostate (N/A, 2008); Colonoscopy; Excision melanoma with sentinel lymph node biopsy (Left, 01/11/2023); Excision melanoma with sentinel lymph node biopsy (N/A, 02/15/2023); and IR Guided Drain W Catheter Placement (03/09/2023). Family: family history includes Depression in his mother; Heart disease in his father.  Laboratory Chemistry Profile   Renal Lab Results  Component Value Date   BUN 23 03/09/2023   CREATININE 1.00 03/09/2023   LABCREA 631 12/13/2019   BCR 28 (H) 07/10/2018   GFRAA >60 02/09/2020   GFRNONAA >60 03/09/2023    Hepatic Lab Results  Component Value Date   AST 16 03/07/2023   ALT 9 03/07/2023   ALBUMIN 2.9 (L) 03/07/2023   ALKPHOS 55 03/07/2023   LIPASE 20 10/10/2022   AMMONIA 25 09/13/2019    Electrolytes Lab Results  Component Value Date   NA 138 03/09/2023   K 4.3 03/09/2023   CL 103 03/09/2023   CALCIUM 8.4 (L) 03/09/2023   MG 2.4 03/07/2023   PHOS 3.4 03/07/2023    Bone Lab Results  Component Value Date   VD25OH 26 08/15/2016   25OHVITD1 34 07/10/2018   25OHVITD2 <1.0 07/10/2018   25OHVITD3 34 07/10/2018    Inflammation (CRP: Acute Phase) (ESR: Chronic Phase) Lab Results  Component Value Date   CRP 5 07/10/2018   ESRSEDRATE 20 07/10/2018   LATICACIDVEN 0.6 02/17/2023         Note: Above Lab results reviewed.  Recent Imaging Review  IR Guided Drain W Catheter Placement INDICATION: Malignant right pleural effusion, plan for hospice care  EXAM: Placement of tunneled pleural drainage catheter (PleurX) using ultrasound and fluoroscopic guidance  MEDICATIONS: Documented in the EMR  ANESTHESIA/SEDATION: Moderate (conscious) sedation was employed during this procedure. A total of Versed 2 mg and Fentanyl 100 mcg was administered intravenously by the radiology nurse.  Total intra-service moderate Sedation Time: 28 minutes. The patient's level of consciousness and vital signs were monitored continuously by radiology nursing throughout  the procedure under my direct supervision.  COMPLICATIONS: None immediate.  PROCEDURE: Informed written consent was obtained from the patient after a thorough discussion of the procedural risks, benefits and alternatives. All questions were addressed. Maximal Sterile Barrier Technique was utilized including caps, mask, sterile gowns, sterile gloves, sterile drape, hand hygiene and skin antiseptic. A timeout was performed prior to the initiation of the procedure.  The patient was placed supine on the exam table. Limited ultrasound of right chest was performed for planning purposes. This demonstrated a large volume pleural effusion. Skin entry site was marked, and the overlying skin was prepped and draped in the standard sterile fashion. Local analgesia was obtained with 1% lidocaine. Under ultrasound guidance, access was obtained into the right pleural cavity using a 19 gauge Yueh needle. An 035 wire was advanced into the pleural space and confirmed with fluoroscopy. The percutaneous tract was then dilated to accommodate a peel-away sheath. Attention was then turned to a more medial location on the chest wall. After the administration of additional local anesthetic, a cuffed pleural drainage catheter was tunneled to the initial access site. Through the peel-away sheath, the pleural drainage catheter was advanced into the pleural space. Location was confirmed with fluoroscopy. The catheter was tested and found to function properly. Approximately 1,200 ml of serosanguineous pleural fluid was removed. The catheter was secured to the skin using silk suture and a  dressing. The initial access site was closed with 4-0 Vicryl and Dermabond. The patient tolerated the procedure well without immediate complication.  IMPRESSION: 1. Successful placement of a right-sided tunneled pleural drainage catheter (PleurX). 2. PleurX catheter used to drain approximately 1.2 L of serosanguineous fluid  from the right pleural space after placement.  Electronically Signed   By: Olive Bass M.D.   On: 03/09/2023 12:24 Note: Reviewed        Physical Exam  General appearance: Well nourished, well developed, and well hydrated. In no apparent acute distress Mental status: Alert, oriented x 3 (person, place, & time)       Respiratory: No evidence of acute respiratory distress Eyes: PERLA Vitals: There were no vitals taken for this visit. BMI: Estimated body mass index is 31 kg/m as calculated from the following:   Height as of 03/06/23: 5\' 10"  (1.778 m).   Weight as of 03/10/23: 216 lb 0.8 oz (98 kg). Ideal: Ideal body weight: 73 kg (160 lb 15 oz) Adjusted ideal body weight: 83 kg (182 lb 15.7 oz)  Assessment   Diagnosis Status  No diagnosis found. Controlled Controlled Controlled   Updated Problems: No problems updated.  Plan of Care  Problem-specific:  No problem-specific Assessment & Plan notes found for this encounter.  [name] has a current medication list which includes the following long-term medication(s): amiodarone, buprenorphine, carvedilol, diclofenac sodium, duloxetine, gabapentin, iron (ferrous sulfate), olanzapine, olmesartan, pantoprazole, rosuvastatin, and trazodone.  Pharmacotherapy (Medications Ordered): No orders of the defined types were placed in this encounter.  Orders:  No orders of the defined types were placed in this encounter.  Follow-up plan:   No follow-ups on file.      Interventional Therapies  Risk Factors  Considerations:  Patient is legally blind. High risk for substance use disorder.  The patient has documented suicidal attempt using medications.  In addition, we have documented the patient to have a binge use pattern and been noncompliant with the instructions on how to use the medications.  This is a reason why went to a buprenorphine patch.   Planned  Pending:   Palliative left Knee genicular nerve RFA #5    Under  consideration:   Palliative bilateral genicular nerve RFA maintenance treatments   Completed:   Therapeutic bilateral IA steroid knee inj. (09/07/2022) (100/100/20/0)  Palliative bilateral IA Hyalgan knee injections Sx2  Diagnostic bilateral genicular NB x1  Palliative right genicular nerve RFA x2 (04/06/2020) (100/100/50/100)  Palliative left genicular nerve RFA x4 (04/18/2022) (100/100/30/40)    Completed by other providers:   N/A   Therapeutic  Palliative (PRN) options:   Palliative bilateral IA Hyalgan knee injections S3/N1  Diagnostic bilateral genicular NB #2  Palliative bilateral genicular nerve RFA #3    Pharmacotherapy  Nonopioid transferred 11/02/2020: Voltaren gel        Recent Visits Date Type Provider Dept  12/27/22 Office Visit Delano Metz, MD [facility]  Showing recent visits within past 90 days and meeting all other requirements Future Appointments Date Type Provider Dept  03/14/23 Appointment Delano Metz, MD [facility]  Showing future appointments within next 90 days and meeting all other requirements  I discussed the assessment and treatment plan with the patient. The patient was provided an opportunity to ask questions and all were answered. The patient agreed with the plan and demonstrated an understanding of the instructions.  Patient advised to call back or seek an in-person evaluation if the symptoms or condition worsens.  Duration of encounter: *** minutes.  Total time on encounter, as per AMA guidelines included both the face-to-face and non-face-to-face time personally spent by the physician and/or other qualified health care professional(s) on the day of the encounter (includes time in activities that require the physician or other qualified health care professional and does not include time in activities normally performed by clinical staff). Physician's time may include the following activities when  performed: Preparing to see the patient (e.g., pre-charting review of records, searching for previously ordered imaging, lab work, and nerve conduction tests) Review of prior analgesic pharmacotherapies. Reviewing PMP Interpreting ordered tests (e.g., lab work, imaging, nerve conduction tests) Performing post-procedure evaluations, including interpretation of diagnostic procedures Obtaining and/or reviewing separately obtained history Performing a medically appropriate examination and/or evaluation Counseling and educating the patient/family/caregiver Ordering medications, tests, or procedures Referring and communicating with other health care professionals (when not separately reported) Documenting clinical information in the electronic or other health record Independently interpreting results (not separately reported) and communicating results to the patient/ family/caregiver Care coordination (not separately reported)  Note by: Oswaldo Done, MD Date: 03/14/2023; Time: 7:27 AM

## 2023-03-13 ENCOUNTER — Emergency Department

## 2023-03-13 ENCOUNTER — Other Ambulatory Visit: Payer: Self-pay

## 2023-03-13 ENCOUNTER — Inpatient Hospital Stay
Admission: EM | Admit: 2023-03-13 | Discharge: 2023-03-15 | DRG: 070 | Disposition: A | Discharge status: Hospice | Attending: Hospitalist | Admitting: Hospitalist

## 2023-03-13 DIAGNOSIS — R410 Disorientation, unspecified: Secondary | ICD-10-CM | POA: Diagnosis not present

## 2023-03-13 DIAGNOSIS — W19XXXA Unspecified fall, initial encounter: Secondary | ICD-10-CM | POA: Diagnosis present

## 2023-03-13 DIAGNOSIS — H409 Unspecified glaucoma: Secondary | ICD-10-CM | POA: Diagnosis present

## 2023-03-13 DIAGNOSIS — I5032 Chronic diastolic (congestive) heart failure: Secondary | ICD-10-CM | POA: Diagnosis present

## 2023-03-13 DIAGNOSIS — Y848 Other medical procedures as the cause of abnormal reaction of the patient, or of later complication, without mention of misadventure at the time of the procedure: Secondary | ICD-10-CM | POA: Diagnosis present

## 2023-03-13 DIAGNOSIS — T8141XA Infection following a procedure, superficial incisional surgical site, initial encounter: Secondary | ICD-10-CM | POA: Diagnosis present

## 2023-03-13 DIAGNOSIS — G9341 Metabolic encephalopathy: Secondary | ICD-10-CM | POA: Diagnosis not present

## 2023-03-13 DIAGNOSIS — Z87891 Personal history of nicotine dependence: Secondary | ICD-10-CM

## 2023-03-13 DIAGNOSIS — R4182 Altered mental status, unspecified: Secondary | ICD-10-CM | POA: Diagnosis present

## 2023-03-13 DIAGNOSIS — A419 Sepsis, unspecified organism: Secondary | ICD-10-CM | POA: Diagnosis not present

## 2023-03-13 DIAGNOSIS — G894 Chronic pain syndrome: Secondary | ICD-10-CM | POA: Diagnosis present

## 2023-03-13 DIAGNOSIS — F419 Anxiety disorder, unspecified: Secondary | ICD-10-CM | POA: Diagnosis present

## 2023-03-13 DIAGNOSIS — N179 Acute kidney failure, unspecified: Secondary | ICD-10-CM | POA: Diagnosis present

## 2023-03-13 DIAGNOSIS — S12100D Unspecified displaced fracture of second cervical vertebra, subsequent encounter for fracture with routine healing: Secondary | ICD-10-CM

## 2023-03-13 DIAGNOSIS — F1120 Opioid dependence, uncomplicated: Secondary | ICD-10-CM | POA: Diagnosis present

## 2023-03-13 DIAGNOSIS — L03114 Cellulitis of left upper limb: Secondary | ICD-10-CM | POA: Diagnosis present

## 2023-03-13 DIAGNOSIS — F05 Delirium due to known physiological condition: Secondary | ICD-10-CM | POA: Diagnosis not present

## 2023-03-13 DIAGNOSIS — J918 Pleural effusion in other conditions classified elsewhere: Secondary | ICD-10-CM | POA: Diagnosis present

## 2023-03-13 DIAGNOSIS — Z8249 Family history of ischemic heart disease and other diseases of the circulatory system: Secondary | ICD-10-CM | POA: Diagnosis not present

## 2023-03-13 DIAGNOSIS — Z66 Do not resuscitate: Secondary | ICD-10-CM | POA: Diagnosis present

## 2023-03-13 DIAGNOSIS — R6520 Severe sepsis without septic shock: Secondary | ICD-10-CM

## 2023-03-13 DIAGNOSIS — M456 Ankylosing spondylitis lumbar region: Secondary | ICD-10-CM | POA: Diagnosis present

## 2023-03-13 DIAGNOSIS — N182 Chronic kidney disease, stage 2 (mild): Secondary | ICD-10-CM | POA: Diagnosis present

## 2023-03-13 DIAGNOSIS — I130 Hypertensive heart and chronic kidney disease with heart failure and stage 1 through stage 4 chronic kidney disease, or unspecified chronic kidney disease: Secondary | ICD-10-CM | POA: Diagnosis present

## 2023-03-13 DIAGNOSIS — Z818 Family history of other mental and behavioral disorders: Secondary | ICD-10-CM

## 2023-03-13 DIAGNOSIS — J90 Pleural effusion, not elsewhere classified: Secondary | ICD-10-CM | POA: Diagnosis not present

## 2023-03-13 DIAGNOSIS — E785 Hyperlipidemia, unspecified: Secondary | ICD-10-CM | POA: Diagnosis present

## 2023-03-13 DIAGNOSIS — I714 Abdominal aortic aneurysm, without rupture, unspecified: Secondary | ICD-10-CM | POA: Diagnosis present

## 2023-03-13 DIAGNOSIS — Z791 Long term (current) use of non-steroidal anti-inflammatories (NSAID): Secondary | ICD-10-CM

## 2023-03-13 DIAGNOSIS — J449 Chronic obstructive pulmonary disease, unspecified: Secondary | ICD-10-CM | POA: Diagnosis present

## 2023-03-13 DIAGNOSIS — K219 Gastro-esophageal reflux disease without esophagitis: Secondary | ICD-10-CM | POA: Diagnosis present

## 2023-03-13 DIAGNOSIS — C4362 Malignant melanoma of left upper limb, including shoulder: Secondary | ICD-10-CM | POA: Diagnosis present

## 2023-03-13 DIAGNOSIS — Z79899 Other long term (current) drug therapy: Secondary | ICD-10-CM

## 2023-03-13 DIAGNOSIS — Z9181 History of falling: Secondary | ICD-10-CM

## 2023-03-13 DIAGNOSIS — I2510 Atherosclerotic heart disease of native coronary artery without angina pectoris: Secondary | ICD-10-CM | POA: Diagnosis present

## 2023-03-13 DIAGNOSIS — H3552 Pigmentary retinal dystrophy: Secondary | ICD-10-CM | POA: Diagnosis present

## 2023-03-13 DIAGNOSIS — Z881 Allergy status to other antibiotic agents status: Secondary | ICD-10-CM

## 2023-03-13 DIAGNOSIS — Z1152 Encounter for screening for COVID-19: Secondary | ICD-10-CM | POA: Diagnosis not present

## 2023-03-13 DIAGNOSIS — D0359 Melanoma in situ of other part of trunk: Secondary | ICD-10-CM | POA: Diagnosis present

## 2023-03-13 DIAGNOSIS — Z85820 Personal history of malignant melanoma of skin: Secondary | ICD-10-CM

## 2023-03-13 DIAGNOSIS — E872 Acidosis, unspecified: Secondary | ICD-10-CM | POA: Diagnosis present

## 2023-03-13 DIAGNOSIS — Z9981 Dependence on supplemental oxygen: Secondary | ICD-10-CM

## 2023-03-13 DIAGNOSIS — H548 Legal blindness, as defined in USA: Secondary | ICD-10-CM | POA: Diagnosis present

## 2023-03-13 DIAGNOSIS — J9621 Acute and chronic respiratory failure with hypoxia: Secondary | ICD-10-CM | POA: Diagnosis present

## 2023-03-13 DIAGNOSIS — E875 Hyperkalemia: Secondary | ICD-10-CM | POA: Diagnosis present

## 2023-03-13 DIAGNOSIS — Z9049 Acquired absence of other specified parts of digestive tract: Secondary | ICD-10-CM

## 2023-03-13 DIAGNOSIS — Z888 Allergy status to other drugs, medicaments and biological substances status: Secondary | ICD-10-CM

## 2023-03-13 LAB — URINALYSIS, W/ REFLEX TO CULTURE (INFECTION SUSPECTED)
Bacteria, UA: NONE SEEN
Bilirubin Urine: NEGATIVE
Glucose, UA: NEGATIVE mg/dL
Hgb urine dipstick: NEGATIVE
Ketones, ur: NEGATIVE mg/dL
Leukocytes,Ua: NEGATIVE
Nitrite: NEGATIVE
Protein, ur: NEGATIVE mg/dL
RBC / HPF: 0 RBC/hpf (ref 0–5)
Specific Gravity, Urine: 1.017 (ref 1.005–1.030)
Squamous Epithelial / HPF: 0 /HPF (ref 0–5)
pH: 5 (ref 5.0–8.0)

## 2023-03-13 LAB — CBC WITH DIFFERENTIAL/PLATELET
Abs Immature Granulocytes: 0.48 10*3/uL — ABNORMAL HIGH (ref 0.00–0.07)
Basophils Absolute: 0 10*3/uL (ref 0.0–0.1)
Basophils Relative: 0 %
Eosinophils Absolute: 0.1 10*3/uL (ref 0.0–0.5)
Eosinophils Relative: 1 %
HCT: 33.2 % — ABNORMAL LOW (ref 39.0–52.0)
Hemoglobin: 9.6 g/dL — ABNORMAL LOW (ref 13.0–17.0)
Immature Granulocytes: 4 %
Lymphocytes Relative: 6 %
Lymphs Abs: 0.7 10*3/uL (ref 0.7–4.0)
MCH: 28.1 pg (ref 26.0–34.0)
MCHC: 28.9 g/dL — ABNORMAL LOW (ref 30.0–36.0)
MCV: 97.1 fL (ref 80.0–100.0)
Monocytes Absolute: 2.2 10*3/uL — ABNORMAL HIGH (ref 0.1–1.0)
Monocytes Relative: 19 %
Neutro Abs: 8.2 10*3/uL — ABNORMAL HIGH (ref 1.7–7.7)
Neutrophils Relative %: 70 %
Platelets: 206 10*3/uL (ref 150–400)
RBC: 3.42 MIL/uL — ABNORMAL LOW (ref 4.22–5.81)
RDW: 15.8 % — ABNORMAL HIGH (ref 11.5–15.5)
WBC: 11.7 10*3/uL — ABNORMAL HIGH (ref 4.0–10.5)
nRBC: 0 % (ref 0.0–0.2)

## 2023-03-13 LAB — PROTIME-INR
INR: 1.1 (ref 0.8–1.2)
Prothrombin Time: 14.8 seconds (ref 11.4–15.2)

## 2023-03-13 LAB — COMPREHENSIVE METABOLIC PANEL
ALT: 10 U/L (ref 0–44)
AST: 25 U/L (ref 15–41)
Albumin: 2.8 g/dL — ABNORMAL LOW (ref 3.5–5.0)
Alkaline Phosphatase: 46 U/L (ref 38–126)
Anion gap: 9 (ref 5–15)
BUN: 38 mg/dL — ABNORMAL HIGH (ref 8–23)
CO2: 25 mmol/L (ref 22–32)
Calcium: 8.3 mg/dL — ABNORMAL LOW (ref 8.9–10.3)
Chloride: 102 mmol/L (ref 98–111)
Creatinine, Ser: 1.88 mg/dL — ABNORMAL HIGH (ref 0.61–1.24)
GFR, Estimated: 35 mL/min — ABNORMAL LOW (ref >60)
Glucose, Bld: 129 mg/dL — ABNORMAL HIGH (ref 70–99)
Potassium: 5.7 mmol/L — ABNORMAL HIGH (ref 3.5–5.1)
Sodium: 136 mmol/L (ref 135–145)
Total Bilirubin: 0.8 mg/dL (ref 0.3–1.2)
Total Protein: 6.3 g/dL — ABNORMAL LOW (ref 6.5–8.1)

## 2023-03-13 LAB — SARS CORONAVIRUS 2 BY RT PCR: SARS Coronavirus 2 by RT PCR: NEGATIVE

## 2023-03-13 LAB — LACTIC ACID, PLASMA
Lactic Acid, Venous: 1.7 mmol/L (ref 0.5–1.9)
Lactic Acid, Venous: 2.2 mmol/L (ref 0.5–1.9)

## 2023-03-13 MED ORDER — ENOXAPARIN SODIUM 40 MG/0.4ML IJ SOSY: 40.0000 mg | PREFILLED_SYRINGE | INTRAMUSCULAR | Status: DC

## 2023-03-13 MED ORDER — SODIUM CHLORIDE 0.9 % IV SOLN
2.0000 g | Freq: Two times a day (BID) | INTRAVENOUS | Status: DC
  Administered 2023-03-13 – 2023-03-15 (×4): 2 g via INTRAVENOUS
  Filled 2023-03-13 (×4): qty 12.5

## 2023-03-13 MED ORDER — FINASTERIDE 5 MG PO TABS
5.0000 mg | ORAL_TABLET | Freq: Every day | ORAL | Status: DC
  Administered 2023-03-14: 5 mg via ORAL
  Filled 2023-03-13 (×2): qty 1

## 2023-03-13 MED ORDER — PANTOPRAZOLE SODIUM 40 MG PO TBEC
40.0000 mg | DELAYED_RELEASE_TABLET | Freq: Every day | ORAL | Status: DC
  Administered 2023-03-14: 40 mg via ORAL
  Filled 2023-03-13 (×2): qty 1

## 2023-03-13 MED ORDER — ENOXAPARIN SODIUM 60 MG/0.6ML IJ SOSY
0.5000 mg/kg | PREFILLED_SYRINGE | INTRAMUSCULAR | Status: DC
  Administered 2023-03-13 – 2023-03-14 (×2): 50 mg via SUBCUTANEOUS
  Filled 2023-03-13 (×2): qty 0.6

## 2023-03-13 MED ORDER — ONDANSETRON HCL 4 MG PO TABS: 4.0000 mg | ORAL_TABLET | Freq: Four times a day (QID) | ORAL | Status: DC | PRN

## 2023-03-13 MED ORDER — LORAZEPAM 0.5 MG PO TABS
0.5000 mg | ORAL_TABLET | Freq: Three times a day (TID) | ORAL | Status: DC | PRN
  Administered 2023-03-14 (×3): 0.5 mg via ORAL
  Filled 2023-03-13 (×3): qty 1

## 2023-03-13 MED ORDER — VANCOMYCIN HCL IN DEXTROSE 1-5 GM/200ML-% IV SOLN
1000.0000 mg | Freq: Once | INTRAVENOUS | Status: AC
  Administered 2023-03-13: 1000 mg via INTRAVENOUS
  Filled 2023-03-13: qty 200

## 2023-03-13 MED ORDER — TAMSULOSIN HCL 0.4 MG PO CAPS
0.4000 mg | ORAL_CAPSULE | Freq: Every day | ORAL | Status: DC
  Administered 2023-03-13 – 2023-03-14 (×2): 0.4 mg via ORAL
  Filled 2023-03-13 (×3): qty 1

## 2023-03-13 MED ORDER — HYDRALAZINE HCL 20 MG/ML IJ SOLN: 5.0000 mg | Freq: Four times a day (QID) | INTRAMUSCULAR | Status: DC | PRN

## 2023-03-13 MED ORDER — DULOXETINE HCL 30 MG PO CPEP
90.0000 mg | ORAL_CAPSULE | Freq: Every day | ORAL | Status: DC
  Administered 2023-03-14: 90 mg via ORAL
  Filled 2023-03-13 (×2): qty 3

## 2023-03-13 MED ORDER — BUPRENORPHINE 10 MCG/HR TD PTWK
1.0000 | MEDICATED_PATCH | TRANSDERMAL | Status: DC
  Filled 2023-03-13: qty 1

## 2023-03-13 MED ORDER — SODIUM CHLORIDE 0.9 % IV SOLN
2.0000 g | Freq: Once | INTRAVENOUS | Status: AC
  Administered 2023-03-13: 2 g via INTRAVENOUS
  Filled 2023-03-13: qty 12.5

## 2023-03-13 MED ORDER — AMIODARONE HCL 200 MG PO TABS
200.0000 mg | ORAL_TABLET | Freq: Two times a day (BID) | ORAL | Status: DC
  Administered 2023-03-13 – 2023-03-14 (×4): 200 mg via ORAL
  Filled 2023-03-13 (×5): qty 1

## 2023-03-13 MED ORDER — SODIUM CHLORIDE 0.9 % IV BOLUS
1000.0000 mL | Freq: Once | INTRAVENOUS | Status: AC
  Administered 2023-03-13: 1000 mL via INTRAVENOUS

## 2023-03-13 MED ORDER — ACETAMINOPHEN 500 MG PO TABS: 1000.0000 mg | ORAL_TABLET | Freq: Four times a day (QID) | ORAL | Status: DC | PRN

## 2023-03-13 MED ORDER — GABAPENTIN 300 MG PO CAPS
300.0000 mg | ORAL_CAPSULE | Freq: Two times a day (BID) | ORAL | Status: DC
  Administered 2023-03-13 – 2023-03-14 (×4): 300 mg via ORAL
  Filled 2023-03-13 (×5): qty 1

## 2023-03-13 MED ORDER — ALBUTEROL SULFATE (2.5 MG/3ML) 0.083% IN NEBU: 3.0000 mL | INHALATION_SOLUTION | RESPIRATORY_TRACT | Status: DC | PRN

## 2023-03-13 MED ORDER — MELOXICAM 7.5 MG PO TABS: 15.0000 mg | ORAL_TABLET | Freq: Every day | ORAL | Status: DC

## 2023-03-13 MED ORDER — SODIUM CHLORIDE 0.9 % IV SOLN: INTRAVENOUS | Status: AC

## 2023-03-13 MED ORDER — DICLOFENAC SODIUM 1 % EX GEL
2.0000 g | Freq: Two times a day (BID) | CUTANEOUS | Status: DC
  Administered 2023-03-14 (×2): 2 g via TOPICAL
  Filled 2023-03-13 (×2): qty 100

## 2023-03-13 MED ORDER — MELATONIN 5 MG PO TABS
5.0000 mg | ORAL_TABLET | Freq: Every day | ORAL | Status: DC
  Administered 2023-03-13 – 2023-03-14 (×2): 5 mg via ORAL
  Filled 2023-03-13 (×2): qty 1

## 2023-03-13 MED ORDER — LATANOPROST 0.005 % OP SOLN
1.0000 [drp] | Freq: Every day | OPHTHALMIC | Status: DC
  Filled 2023-03-13: qty 2.5

## 2023-03-13 MED ORDER — LACTATED RINGERS IV BOLUS
1000.0000 mL | Freq: Once | INTRAVENOUS | Status: AC
  Administered 2023-03-13: 1000 mL via INTRAVENOUS

## 2023-03-13 MED ORDER — OLANZAPINE 2.5 MG PO TABS
2.5000 mg | ORAL_TABLET | Freq: Two times a day (BID) | ORAL | Status: DC
  Administered 2023-03-13 – 2023-03-14 (×4): 2.5 mg via ORAL
  Filled 2023-03-13 (×6): qty 1

## 2023-03-13 MED ORDER — DORZOLAMIDE HCL-TIMOLOL MAL 2-0.5 % OP SOLN
1.0000 [drp] | Freq: Two times a day (BID) | OPHTHALMIC | Status: DC
  Administered 2023-03-14: 1 [drp] via OPHTHALMIC
  Filled 2023-03-13 (×2): qty 10

## 2023-03-13 MED ORDER — OXYCODONE HCL 5 MG PO TABS
5.0000 mg | ORAL_TABLET | Freq: Four times a day (QID) | ORAL | Status: DC | PRN
  Administered 2023-03-13 – 2023-03-14 (×3): 10 mg via ORAL
  Filled 2023-03-13 (×3): qty 2

## 2023-03-13 MED ORDER — ROSUVASTATIN CALCIUM 5 MG PO TABS
5.0000 mg | ORAL_TABLET | Freq: Every evening | ORAL | Status: DC
  Administered 2023-03-14: 5 mg via ORAL
  Filled 2023-03-13 (×2): qty 1

## 2023-03-13 MED ORDER — SODIUM ZIRCONIUM CYCLOSILICATE 10 G PO PACK
10.0000 g | PACK | Freq: Once | ORAL | Status: DC
  Filled 2023-03-13: qty 1

## 2023-03-13 MED ORDER — ENSURE ENLIVE PO LIQD
237.0000 mL | Freq: Three times a day (TID) | ORAL | Status: DC
  Administered 2023-03-13: 237 mL via ORAL

## 2023-03-13 MED ORDER — POLYETHYLENE GLYCOL 3350 17 G PO PACK
17.0000 g | PACK | Freq: Two times a day (BID) | ORAL | Status: DC
  Administered 2023-03-13 – 2023-03-14 (×2): 17 g via ORAL
  Filled 2023-03-13 (×3): qty 1

## 2023-03-13 MED ORDER — LORAZEPAM 1 MG PO TABS: 1.0000 mg | ORAL_TABLET | Freq: Three times a day (TID) | ORAL | Status: DC | PRN

## 2023-03-13 MED ORDER — TRAZODONE HCL 50 MG PO TABS
150.0000 mg | ORAL_TABLET | Freq: Every evening | ORAL | Status: DC | PRN
  Administered 2023-03-13 – 2023-03-14 (×2): 150 mg via ORAL
  Filled 2023-03-13 (×2): qty 1

## 2023-03-13 NOTE — ED Provider Notes (Signed)
[facility] Provider Note    Event Date/Time   First MD Initiated Contact with Patient 03/13/23 [phone]     (approximate)   History   Altered Mental Status   HPI  Aaron Gibbs is a 88 y.o. male presents to the emergency department today because of concerns for altered mental status.  Patient cannot give a great history as to what happened.  Per report he did suffer a fall 2 days ago.  He was not evaluated after this fall.  Today when caregiver went they noticed that he was having hallucination type altered mental status episodes.  Patient is able to say he fell however cannot say if he hit his head or had any traumatic injuries from the fall.  He does state he feels short of breath.  EMS did find patient to be hypoxic upon arrival.     Physical Exam   Triage Vital Signs: ED Triage Vitals  Encounter Vitals Group     BP 03/13/23 0730 (!) 88/41     Systolic BP Percentile --      Diastolic BP Percentile --      Pulse Rate 03/13/23 0731 75     Resp 03/13/23 0730 (!) 22     Temp 03/13/23 0730 98.5 F (36.9 C)     Temp Source 03/13/23 0730 Oral     SpO2 03/13/23 0730 95 %     Weight 03/13/23 0746 216 lb 0.8 oz (98 kg)     Height 03/13/23 0746 5\' 10"  (1.778 m)     Head Circumference --      Peak Flow --      Pain Score 03/13/23 0746 10     Pain Loc --      Pain Education --      Exclude from Growth Chart --     Most recent vital signs: Vitals:   03/13/23 0731 03/13/23 0745  BP: (!) 88/41 (!) 117/50  Pulse: 75 77  Resp: (!) 24 (!) 29  Temp:    SpO2: 96% 93%   General: Awake, alert, not completely oriented. CV:  Good peripheral perfusion. Regular rate and rhythm. Resp:  Increased work of breathing.  Abd:  No distention.    ED Results / Procedures / Treatments   Labs (all labs ordered are listed, but only abnormal results are displayed) Labs Reviewed  COMPREHENSIVE METABOLIC PANEL - Abnormal; Notable for the following components:       Result Value   Potassium 5.7 (*)    Glucose, Bld 129 (*)    BUN 38 (*)    Creatinine, Ser 1.88 (*)    Calcium 8.3 (*)    Total Protein 6.3 (*)    Albumin 2.8 (*)    GFR, Estimated 35 (*)    All other components within normal limits  CBC WITH DIFFERENTIAL/PLATELET - Abnormal; Notable for the following components:   WBC 11.7 (*)    RBC 3.42 (*)    Hemoglobin 9.6 (*)    HCT 33.2 (*)    MCHC 28.9 (*)    RDW 15.8 (*)    Neutro Abs 8.2 (*)    Monocytes Absolute 2.2 (*)    Abs Immature Granulocytes 0.48 (*)    All other components within normal limits  CULTURE, BLOOD (ROUTINE X 2)  CULTURE, BLOOD (ROUTINE X 2)  LACTIC ACID, PLASMA  PROTIME-INR  LACTIC ACID, PLASMA  URINALYSIS, W/ REFLEX TO CULTURE (INFECTION SUSPECTED)     EKG  I,  Phineas Semen, attending physician, personally viewed and interpreted this EKG  EKG Time: 0726 Rate: 78 Rhythm: sinus rhythm Axis: normal Intervals: qtc 463 QRS: narrow ST changes: no st elevation Impression: normal ekg   RADIOLOGY I independently interpreted and visualized the CXR. My interpretation: Bilateral airspace opacities Radiology interpretation:  IMPRESSION:  *At least moderate right pleural effusion, grossly unchanged.    I independently interpreted and visualized the CT head/cervical spine. My interpretation: No intracranial bleed. No acute fracture. Radiology interpretation:  IMPRESSION:  1. No acute intracranial abnormality.  2. Redemonstration of patient's known odontoid fracture. No acute  fracture or traumatic malalignment of the cervical spine.      PROCEDURES:  Critical Care performed: Yes  CRITICAL CARE Performed by: Phineas Semen   Total critical care time: 30 minutes  Critical care time was exclusive of separately billable procedures and treating other patients.  Critical care was necessary to treat or prevent imminent or life-threatening deterioration.  Critical care was time spent personally by  me on the following activities: development of treatment plan with patient and/or surrogate as well as nursing, discussions with consultants, evaluation of patient's response to treatment, examination of patient, obtaining history from patient or surrogate, ordering and performing treatments and interventions, ordering and review of laboratory studies, ordering and review of radiographic studies, pulse oximetry and re-evaluation of patient's condition.   Procedures    MEDICATIONS ORDERED IN ED: Medications  lactated ringers bolus 1,000 mL (has no administration in time range)  sodium chloride 0.9 % bolus 1,000 mL (1,000 mLs Intravenous New Bag/Given 03/13/23 0758)     IMPRESSION / MDM / ASSESSMENT AND PLAN / ED COURSE  I reviewed the triage vital signs and the nursing notes.                              Differential diagnosis includes, but is not limited to, pneumonia, infection, ICH, medication effect  Patient's presentation is most consistent with acute presentation with potential threat to life or bodily function.   The patient is on the cardiac monitor to evaluate for evidence of arrhythmia and/or significant heart rate changes.  Patient presented to the emergency department today because of concerns for altered mental status.  Patient is not completely oriented to events.  Patient's blood pressure was initially found to be low.  Will check blood work.  Patient will be given IV fluids.  After IV fluids patient's blood pressure did improve.  Blood work is concerning for slight lactic acidosis.  At this time I do have concerns for infection with unclear etiology.  He does have a sore on his left arm which could potentially be the source.  Discussed with Dr. Chipper Herb with the hospitalist service who will plan on admission.      FINAL CLINICAL IMPRESSION(S) / ED DIAGNOSES   Final diagnoses:  Altered mental status, unspecified altered mental status type      Note:  This document  was prepared using Dragon voice recognition software and may include unintentional dictation errors.    Phineas Semen, MD 03/13/23 [phone]

## 2023-03-13 NOTE — Progress Notes (Signed)
[facility] ED 30 [facility] Liaison Note?    [name] is a current [facility] patient admitted 9.22.24 with a terminal diagnosis of Hypertensive Heart Disease with Heart Failure. He had a fall on the day of admission to hospice and since that time has been having deteriorating mental status. Hospice nurse was called to home this morning for PRN visit but decision was made to send him to the hospital due to hallucinations. He is being admitted with a diagnosis of AMS and Sepsis. Per Dr. Patric Gibbs with [facility] this is a related hospital admission.   Visited at bedside with patient and caregiver. Patient sleeping and in no acute distress. Noted to have abrased area on forehead with some scant drainage. He is inpatient appropriate with need for testing, acute skilled observation and IV antibiotics.   Vital Signs:?97.6/79/31    137/48   97% on 6L nasal cannula I/O:  not yet recorded Abnormal labs: K+ 5.7, BUN 38, Creatinine 1.88, Ca+ 8.3, Albumin 2.8, GFR 35, Lactic Acid 2.2, WBC 11.7, RBC 3.42, Hgb 9.6, Hct 33.2, D-Dimer 1.28 Diagnostics:?  CT HEAD WITHOUT CONTRAST  CT CERVICAL SPINE WITHOUT CONTRAST  TECHNIQUE: Multidetector CT imaging of the head and cervical spine was performed following the standard protocol without intravenous contrast. Multiplanar CT image reconstructions of the cervical spine were also generated. RADIATION DOSE REDUCTION: This exam was performed according to the departmental dose-optimization program which includes automated exposure control, adjustment of the mA and/or kV according to patient size and/or use of iterative reconstruction technique.  COMPARISON:  CT scan head and cervical spine from 11/18/2022.  FINDINGS: CT HEAD FINDINGS CT CERVICAL SPINE FINDINGS  IMPRESSION: 1. No acute intracranial abnormality. 2. Redemonstration of patient's known odontoid fracture. No acute fracture or traumatic malalignment of the  cervical spine.  Electronically Signed   By: Aaron Gibbs M.D.   On: 03/13/2023 10:04  IV/PRN Meds: NS @ 168ml/H, Cefepime 2g IVPB q12H, LR 1L bolus x2, NS 1L bolus x1,  Vancomycin 1gram IVBP x1   Problem list:?   Acute metabolic encephalopathy -Clinically suspect sepsis -Source from infection considered to be left arm ulcer infection/cellulitis -Plan to continue cefepime, check MRSA screening, hold off redosing vancomycin for now -Look for other source of infection, UA is pending. -Other DDx, daughter also expressed concern about polypharmacy as patient was recently started on Ativan for anxiety, will cut down Ativan dosage.  Also melanoma was diagnosed recently with a negative PET scan.  However if patient's mentation not recovering after all the above management, consider further brain image study such as MRI and EEG. -Cut down Ativan from 5 mg to 0.5 mg   Sepsis  -Evidenced by leukocytosis, elevated lactic acid, with signs of endorgan damage of acute encephalopathy source of infection as of now considered to be left arm ulcer infection, starting cefepime as mentioned above -Clinically appears to be volume contracted, will continue IV fluid   AKI on CKD stage II -Secondary to sepsis, continue IV fluid x 10 hours then reevaluate.   Hyperkalemia -Secondary to AKI, on IV fluid -1 dose of Lokelma given.  Recheck K level tomorrow   Acute on chronic hypoxic respiratory failure secondary to recurrent right-sided pleural effusion -Reviewed pathology report with pathology, the cytospin you did limited amount of sample on last thoracentesis and pleural effusion samples.  Which only showed atypical cells.  Discussed with pathology, we will send pleural fluid sample today for cytospin and pathology study -Clinically suspect the pleural  effusion is malignant as the chemistry study on the pleural fluid showed exudate.  Chemistry and pathology report were discussed with patient daughter over the  phone. -Communicated with nurse and put order in for drainage from right side chest Pleurx catheter and cytospin and cytology study.   Discharge Planning:? Ongoing Family contact: talked with caregiver at bedside   IDT:? Updated?   Goals of Care: Ongoing Please call with any hospice related questions or concerns. Thea Gist, BSN RN Hospice hospital liaison [phone]

## 2023-03-13 NOTE — ED Triage Notes (Signed)
Pt to ED via [facility] for AMS. Pt's caregiver reports he has been having hallucinations since this morning. Per EMS, pt's caregiver reports that he fell 2 days ago but was not seen for the fall. Unknown if pt hit head. Per EMS pt has only been oriented to self. Pt's baseline is oriented x4. Pt oriented to self and place at this time. Pt also complains of back pain and chest pain but cannot describe pain. Per EMS pt's SPO2 was in the 70s on RA. EMS put pt on 10L NRB and reports O2 sats came up to 97%. Pt has 2L O2 at home as needed.  EMS vitals: 99.4 Oral Temp CBG 134 BP 92/61 75 HR  41 ETCO2

## 2023-03-13 NOTE — ED Notes (Signed)
Pt BP reading low diastolic and MAP. Pt laying on left side with BP cuff on right arm due to IV access and wound restrictions on left arm. Pt assisted by this RN and Lawyer onto back. Pt repeat BP 85/59. Pt repositioned more and BP reading 109/74. EDP made aware.

## 2023-03-13 NOTE — Plan of Care (Signed)
Drained pleurex catheter (full bottle) and also sent to lab for cytology spin.

## 2023-03-13 NOTE — ED Notes (Signed)
Pt to CT with this RN and CT tech

## 2023-03-13 NOTE — ED Notes (Signed)
Lab at bedside for 2nd set of cultures.

## 2023-03-13 NOTE — ED Notes (Signed)
Caregiver at bedside

## 2023-03-13 NOTE — ED Notes (Signed)
Pt back from CT with this RN. EDP at bedside

## 2023-03-13 NOTE — ED Notes (Signed)
Lab called to get repeat lactic.

## 2023-03-13 NOTE — Consult Note (Signed)
PHARMACIST - PHYSICIAN COMMUNICATION  CONCERNING:  Enoxaparin (Lovenox) for DVT Prophylaxis    RECOMMENDATION: Patient was prescribed enoxaprin 40mg  q24 hours for VTE prophylaxis.   Filed Weights   03/13/23 0746  Weight: 98 kg (216 lb 0.8 oz)    Body mass index is 31 kg/m.  Estimated Creatinine Clearance: 33.7 mL/min (A) (by C-G formula based on SCr of 1.88 mg/dL (H)).   Based on [facility] policy patient is candidate for enoxaparin 0.5mg /kg TBW SQ every 24 hours based on BMI being >30.  DESCRIPTION: Pharmacy has adjusted enoxaparin dose per [facility] policy.  Patient is now receiving enoxaparin 50 mg every 24 hours   Celene Squibb, PharmD Clinical Pharmacist 03/13/2023 3:23 PM

## 2023-03-13 NOTE — ED Notes (Signed)
Caregiver at bedside

## 2023-03-13 NOTE — H&P (Addendum)
History and Physical    Aaron Gibbs ZOX:[id number] DOB: 1935/06/01 DOA: 03/13/2023  PCP: Aaron Cords, DO (Confirm with patient/family/NH records and if not entered, this has to be entered at [facility] point of entry) Patient coming from: Home  I have personally briefly reviewed patient's old medical records in [facility]  Chief Complaint: Feeling weak, pain all over  HPI: Aaron Gibbs is a 88 y.o. 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  Patient does not remember much of the intensity and only complain about feeling weak and uncontrolled chronic pain " everywhere" including shoulder, back and legs.  Caregiver called EMS today for patient has had multiple episodes of visual hallucinations.  Recently patient was diagnosed with large right-sided pleural fusion which was drained and Pleurx catheter was placed in and family start drainage every other days without issue.  There was no fever, denied any diarrhea no urinary symptoms no cough.  On arrival in the ED it was found patient was hypoxic with O2 saturation 70s patient was placed on 10 L of oxygen.  Workup showed AKI's creatinine 1.8 compared to baseline 1.2-1.4, K5.7, bicarb 25, WBC 11.7, lactic acid 2.2.  CT head neck negative for acute findings.  Patient was given IV bolus x 1 and vancomycin and cefepime in the ED.  Review of Systems: As per HPI otherwise 14 point review of systems negative.    Past Medical History:  Diagnosis Date   (HFpEF) heart failure with preserved ejection fraction (HCC)    AAA (abdominal aortic aneurysm) without rupture (HCC)    5cm/Followed by Festus Barren MD/Vascular/[facility]   Ankylosing spondylitis lumbar region  [facility])    Anxiety    C2 cervical fracture (HCC) 09/2022   Traumatic Fall. Medically manage/no surgical intervention/Soft Collar   Chronic pain after traumatic injury 09/2022   Traumatic Fall= C2 Fracture. 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 malignant melanoma of skin (HCC)    Left Forearm   Major depressive disorder, recurrent episode, severe (HCC)    Melanoma in situ of left upper arm (HCC)    s/p excision 02/15/23   Multiple falls    Opioid dependence (HCC)    Osteoarthritis    Retinitis pigmentosa of both eyes    Legally Blind   Sensorineural hearing loss (SNHL) of both ears    Suicide attempt (HCC) (05/13/18) 06/26/2018    Past Surgical History:  Procedure Laterality Date   CHOLECYSTECTOMY     COLONOSCOPY     EXCISION MELANOMA WITH SENTINEL LYMPH NODE BIOPSY Left 01/11/2023   Procedure: CAUTERIZATION OF BLEEDING WOUND ON LEFT FOREARM;  Surgeon: [name], MD;  Location: [facility] ORS;  Service: General;  Laterality: Left;   EXCISION MELANOMA WITH SENTINEL LYMPH NODE BIOPSY N/A 02/15/2023   Procedure: WIDE LOCAL EXCISION LEFT FOREARM MELANOMA SITES WITH SKIN SUBSTITUTE COVERAGE, SENTINEL LYMPH NODE BIOPSY;  Surgeon: [name], MD;  Location: [facility] OR;  Service: General;  Laterality: N/A;   IR GUIDED DRAIN W CATHETER PLACEMENT  03/09/2023   TRANSURETHRAL RESECTION  OF PROSTATE N/A 2008     reports that he has quit smoking. His smoking use included cigarettes. He has quit using smokeless tobacco.  His smokeless tobacco use included chew. He reports that he does not currently use alcohol. He reports that he does not use drugs.  Allergies  Allergen Reactions   Anthralin Shortness Of Breath   Azithromycin Shortness Of Breath    Family History   Problem Relation Age of Onset   Depression Mother    Heart disease Father      Prior to Admission medications   Medication Sig Start Date End Date Taking? Authorizing Provider  acetaminophen (TYLENOL) 500 MG tablet Take 1,000 mg by mouth in the morning, at noon, and at bedtime.    [provider]  albuterol (VENTOLIN HFA) 108 (90 Base) MCG/ACT inhaler Inhale 2 puffs into the lungs every 4 (four) hours as needed for wheezing or shortness of breath.    [provider]  amiodarone (PACERONE) 200 MG tablet Take 1 tablet (200 mg total) by mouth 2 (two) times daily. Only start this script after completing amiodarone 400 mg BID x 14 days. 02/22/23 03/24/23  Charise Killian, MD  buprenorphine (BUTRANS) 10 MCG/HR PTWK Place 1 patch onto the skin once a week for 28 days. Apply only 1 patch at a time and alternate sites weekly. 12/27/22 02/13/24  Delano Metz, MD  carvedilol (COREG) 12.5 MG tablet Take 1 tablet (12.5 mg total) by mouth 2 (two) times daily. 12/27/22   Karamalegos, Netta Neat, DO  COSOPT 22.3-6.8 MG/ML ophthalmic solution Place 1 drop into both eyes 2 (two) times daily. 06/25/20   [provider]  diclofenac Sodium (VOLTAREN) 1 % GEL Apply 2 g topically at bedtime. Apply to both knees twice daily Patient taking differently: Apply 2 g topically at bedtime. Apply to both knees at night 02/10/21 10/17/23  Karamalegos, Netta Neat, DO  DULoxetine (CYMBALTA) 30 MG capsule Take 3 capsules (90 mg total) by mouth daily. 12/27/22   Karamalegos, Netta Neat, DO  feeding supplement (ENSURE ENLIVE / ENSURE PLUS) LIQD Take 237 mLs by mouth 3 (three) times daily between meals. 03/10/23   Pennie Banter, DO  finasteride (PROSCAR) 5 MG tablet Take 1 tablet (5 mg total) by mouth daily. 12/27/22   Karamalegos, Netta Neat, DO  gabapentin (NEURONTIN) 300 MG capsule Take 1 capsule (300 mg total) by mouth 2 (two) times daily. 12/27/22   Karamalegos, Netta Neat, DO  Iron, Ferrous  Sulfate, 325 (65 Fe) MG TABS Take 325 mg by mouth daily with supper. 06/06/22   Karamalegos, Netta Neat, DO  latanoprost (XALATAN) 0.005 % ophthalmic solution Place 1 drop into both eyes at bedtime. 02/28/22   [provider]  LORazepam (ATIVAN) 1 MG tablet Take 1 tablet (1 mg total) by mouth 3 (three) times daily as needed for anxiety. 03/10/23   Pennie Banter, DO  Melatonin 5 MG CAPS Take 1 capsule (5 mg total) by mouth at bedtime. Patient taking differently: Take 15 mg by mouth at bedtime. 02/03/22   Karamalegos, Netta Neat, DO  meloxicam (MOBIC) 15 MG tablet TAKE 1 TABLET(15 MG) BY MOUTH DAILY 02/20/23   Althea Charon, Netta Neat, DO  Multiple Vitamin (MULTIVITAMIN WITH MINERALS) TABS tablet Take 1 tablet by mouth daily. 03/11/23   Esaw Grandchild A, DO  OLANZapine (ZYPREXA) 2.5 MG tablet Take 1 tablet (2.5 mg total) by mouth 2 (two) times daily. 12/27/22   Karamalegos, Netta Neat, DO  olmesartan (BENICAR) 40  MG tablet Take 1 tablet (40 mg total) by mouth daily. 01/30/23   Karamalegos, Netta Neat, DO  ondansetron (ZOFRAN) 4 MG tablet Take 1 tablet (4 mg total) by mouth every 6 (six) hours as needed for nausea. 03/10/23   Pennie Banter, DO  oxyCODONE (ROXICODONE) 5 MG immediate release tablet Take 1-2 tablets (5-10 mg total) by mouth every 6 (six) hours as needed for moderate pain or severe pain. 03/10/23 03/09/24  Pennie Banter, DO  pantoprazole (PROTONIX) 40 MG tablet Take 1 tablet (40 mg total) by mouth daily before breakfast. 12/27/22   Karamalegos, Netta Neat, DO  polyethylene glycol (MIRALAX / GLYCOLAX) 17 g packet Take 17 g by mouth 2 (two) times daily. 10/30/22   Narda Bonds, MD  rosuvastatin (CRESTOR) 5 MG tablet Take 1 tablet (5 mg total) by mouth every evening. 02/22/23 03/24/23  Charise Killian, MD  tamsulosin (FLOMAX) 0.4 MG CAPS capsule Take 1 capsule (0.4 mg total) by mouth daily. 12/27/22   Karamalegos, Netta Neat, DO  traZODone (DESYREL) 150 MG tablet Take 1 tablet  (150 mg total) by mouth at bedtime as needed. 12/27/22   Aaron Cords, DO    Physical Exam: Vitals:   03/13/23 1200 03/13/23 1210 03/13/23 1223 03/13/23 1250  BP: (!) 91/57 (!) 121/56  (!) 137/48  Pulse: 67 79  79  Resp: 20 16  (!) 31  Temp:   97.6 F (36.4 C)   TempSrc:   Oral   SpO2: 99% 99%  97%  Weight:      Height:        Constitutional: NAD, calm, comfortable Vitals:   03/13/23 1200 03/13/23 1210 03/13/23 1223 03/13/23 1250  BP: (!) 91/57 (!) 121/56  (!) 137/48  Pulse: 67 79  79  Resp: 20 16  (!) 31  Temp:   97.6 F (36.4 C)   TempSrc:   Oral   SpO2: 99% 99%  97%  Weight:      Height:       Eyes: PERRL, lids and conjunctivae normal ENMT: Mucous membranes are moist. Posterior pharynx clear of any exudate or lesions.Normal dentition.  Neck: normal, supple, no masses, no thyromegaly Respiratory: clear to auscultation bilaterally, no wheezing, crackles on the right side. Normal respiratory effort. No accessory muscle use.  Cardiovascular: Regular rate and rhythm, no murmurs / rubs / gallops. No extremity edema. 2+ pedal pulses. No carotid bruits.  Abdomen: no tenderness, no masses palpated. No hepatosplenomegaly. Bowel sounds positive.  Musculoskeletal: no clubbing / cyanosis. No joint deformity upper and lower extremities. Good ROM, no contractures. Normal muscle tone.  Skin: Chronic ulcer on left arm with scanty discharge Neurologic: CN 2-12 grossly intact. Sensation intact, DTR normal. Strength 5/5 in all 4.  Psychiatric: Normal judgment and insight. Alert and oriented x 3. Normal mood.     Labs on Admission: I have personally reviewed following labs and imaging studies  CBC: Recent Labs  Lab 03/07/23 0629 03/13/23 0740  WBC 5.5 11.7*  NEUTROABS  --  8.2*  HGB 9.5* 9.6*  HCT 31.2* 33.2*  MCV 95.1 97.1  PLT 316 206   Basic Metabolic Panel: Recent Labs  Lab 03/07/23 0629 03/08/23 0303 03/09/23 0607 03/13/23 0740  NA 140 137 138 136  K 3.9  4.3 4.3 5.7*  CL 105 100 103 102  CO2 26 29 29 25   GLUCOSE 112* 120* 102* 129*  BUN 27* 29* 23 38*  CREATININE 1.42* 1.28* 1.00 1.88*  CALCIUM  8.4* 8.7* 8.4* 8.3*  MG 2.4  --   --   --   PHOS 3.4  --   --   --    GFR: Estimated Creatinine Clearance: 33.7 mL/min (A) (by C-G formula based on SCr of 1.88 mg/dL (H)). Liver Function Tests: Recent Labs  Lab 03/07/23 0629 03/13/23 0740  AST 16 25  ALT 9 10  ALKPHOS 55 46  BILITOT 0.5 0.8  PROT 6.5 6.3*  ALBUMIN 2.9* 2.8*   No results for input(s): "LIPASE", "AMYLASE" in the last 168 hours. No results for input(s): "AMMONIA" in the last 168 hours. Coagulation Profile: Recent Labs  Lab 03/13/23 0740  INR 1.1   Cardiac Enzymes: No results for input(s): "CKTOTAL", "CKMB", "CKMBINDEX", "TROPONINI" in the last 168 hours. BNP (last 3 results) No results for input(s): "PROBNP" in the last 8760 hours. HbA1C: No results for input(s): "HGBA1C" in the last 72 hours. CBG: Recent Labs  Lab 03/09/23 2125  GLUCAP 88   Lipid Profile: No results for input(s): "CHOL", "HDL", "LDLCALC", "TRIG", "CHOLHDL", "LDLDIRECT" in the last 72 hours. Thyroid Function Tests: No results for input(s): "TSH", "T4TOTAL", "FREET4", "T3FREE", "THYROIDAB" in the last 72 hours. Anemia Panel: No results for input(s): "VITAMINB12", "FOLATE", "FERRITIN", "TIBC", "IRON", "RETICCTPCT" in the last 72 hours. Urine analysis:    Component Value Date/Time   COLORURINE YELLOW 02/18/2023 0218   APPEARANCEUR CLEAR 02/18/2023 0218   LABSPEC 1.015 02/18/2023 0218   PHURINE 5.5 02/18/2023 0218   GLUCOSEU NEGATIVE 02/18/2023 0218   HGBUR NEGATIVE 02/18/2023 0218   BILIRUBINUR NEGATIVE 02/18/2023 0218   BILIRUBINUR Negative 07/12/2020 1348   KETONESUR NEGATIVE 02/18/2023 0218   PROTEINUR NEGATIVE 02/18/2023 0218   UROBILINOGEN 0.2 07/12/2020 1348   NITRITE NEGATIVE 02/18/2023 0218   LEUKOCYTESUR NEGATIVE 02/18/2023 0218    Radiological Exams on Admission: CT Head  Wo Contrast  Result Date: 03/13/2023 CLINICAL DATA:  Fall.  Hallucinations.  Chest pain and back pain. EXAM: CT HEAD WITHOUT CONTRAST CT CERVICAL SPINE WITHOUT CONTRAST TECHNIQUE: Multidetector CT imaging of the head and cervical spine was performed following the standard protocol without intravenous contrast. Multiplanar CT image reconstructions of the cervical spine were also generated. RADIATION DOSE REDUCTION: This exam was performed according to the departmental dose-optimization program which includes automated exposure control, adjustment of the mA and/or kV according to patient size and/or use of iterative reconstruction technique. COMPARISON:  CT scan head and cervical spine from 11/18/2022. FINDINGS: CT HEAD FINDINGS Brain: No evidence of acute infarction, hemorrhage, hydrocephalus, extra-axial collection or mass lesion/mass effect. There is bilateral periventricular hypodensity, which is non-specific but most likely seen in the settings of microvascular ischemic changes. Mild in extent. Vascular: No hyperdense vessel or unexpected calcification. Skull: Normal. Negative for fracture or focal lesion. Sinuses/Orbits: No acute finding. Mild-to-moderate mucoperiosteal thickening noted in the bilateral maxillary sinus and left frontal sinus. No air-fluid levels to suggest acute sinusitis. Other: None. CT CERVICAL SPINE FINDINGS Alignment: Normal. This examination does not assess for ligamentous injury or stability. Skull base and vertebrae: Redemonstration of patient's known comminuted fracture through the base of the odontoid process with extension into the right lateral mass. No bony bridging is noted. No acute fracture.  Note is again made of diffuse ankylosis. Soft tissues and spinal canal: No prevertebral fluid or swelling. No visible canal hematoma. Disc levels: Redemonstration of diffuse bridging anterior osteophyte formation. There is fusion of multiple bilateral facet joints. Intervertebral disc  heights are essentially unchanged. Upper chest: Negative. Other: None. IMPRESSION:  1. No acute intracranial abnormality. 2. Redemonstration of patient's known odontoid fracture. No acute fracture or traumatic malalignment of the cervical spine. Electronically Signed   By: Jules Schick M.D.   On: 03/13/2023 10:04   CT Cervical Spine Wo Contrast  Result Date: 03/13/2023 CLINICAL DATA:  Fall.  Hallucinations.  Chest pain and back pain. EXAM: CT HEAD WITHOUT CONTRAST CT CERVICAL SPINE WITHOUT CONTRAST TECHNIQUE: Multidetector CT imaging of the head and cervical spine was performed following the standard protocol without intravenous contrast. Multiplanar CT image reconstructions of the cervical spine were also generated. RADIATION DOSE REDUCTION: This exam was performed according to the departmental dose-optimization program which includes automated exposure control, adjustment of the mA and/or kV according to patient size and/or use of iterative reconstruction technique. COMPARISON:  CT scan head and cervical spine from 11/18/2022. FINDINGS: CT HEAD FINDINGS Brain: No evidence of acute infarction, hemorrhage, hydrocephalus, extra-axial collection or mass lesion/mass effect. There is bilateral periventricular hypodensity, which is non-specific but most likely seen in the settings of microvascular ischemic changes. Mild in extent. Vascular: No hyperdense vessel or unexpected calcification. Skull: Normal. Negative for fracture or focal lesion. Sinuses/Orbits: No acute finding. Mild-to-moderate mucoperiosteal thickening noted in the bilateral maxillary sinus and left frontal sinus. No air-fluid levels to suggest acute sinusitis. Other: None. CT CERVICAL SPINE FINDINGS Alignment: Normal. This examination does not assess for ligamentous injury or stability. Skull base and vertebrae: Redemonstration of patient's known comminuted fracture through the base of the odontoid process with extension into the right lateral mass.  No bony bridging is noted. No acute fracture.  Note is again made of diffuse ankylosis. Soft tissues and spinal canal: No prevertebral fluid or swelling. No visible canal hematoma. Disc levels: Redemonstration of diffuse bridging anterior osteophyte formation. There is fusion of multiple bilateral facet joints. Intervertebral disc heights are essentially unchanged. Upper chest: Negative. Other: None. IMPRESSION: 1. No acute intracranial abnormality. 2. Redemonstration of patient's known odontoid fracture. No acute fracture or traumatic malalignment of the cervical spine. Electronically Signed   By: Jules Schick M.D.   On: 03/13/2023 10:04   DG Chest Port 1 View  Result Date: 03/13/2023 CLINICAL DATA:  Altered mental status.  History of fall 2 days back. EXAM: PORTABLE CHEST 1 VIEW COMPARISON:  03/08/2023. FINDINGS: Low lung volume.  Central pulmonary vascular congestion. At least moderate layering right pleural effusion, grossly similar to the prior study. Probable trace left pleural effusion. No pneumothorax on either side. Patchy opacity at the left lung base is essentially unchanged. Stable cardio-mediastinal silhouette. No acute osseous abnormalities. The soft tissues are within normal limits. IMPRESSION: *At least moderate right pleural effusion, grossly unchanged. Electronically Signed   By: Jules Schick M.D.   On: 03/13/2023 09:53    EKG: Independently reviewed.  Sinus rhythm, no acute ST changes.  Assessment/Plan Principal Problem:   Sepsis (HCC) Active Problems:   Melanoma in situ of trunk (HCC)   Cellulitis of left arm  (please populate well all problems here in Problem List. (For example, if patient is on BP meds at home and you resume or decide to hold them, it is a problem that needs to be her. Same for CAD, COPD, HLD and so on)   Acute metabolic encephalopathy -Clinically suspect sepsis -Source from infection considered to be left arm ulcer infection/cellulitis -Plan to continue  cefepime, check MRSA screening, hold off redosing vancomycin for now -Look for other source of infection, UA is pending. -Other DDx, daughter also expressed concern  about polypharmacy as patient was recently started on Ativan for anxiety, will cut down Ativan dosage.  Also melanoma was diagnosed recently with a negative PET scan.  However if patient's mentation not recovering after all the above management, consider further brain image study such as MRI and EEG. -Cut down Ativan from 5 mg to 0.5 mg  Sepsis  -Evidenced by leukocytosis, elevated lactic acid, with signs of endorgan damage of acute encephalopathy source of infection as of now considered to be left arm ulcer infection, starting cefepime as mentioned above -Clinically appears to be volume contracted, will continue IV fluid  AKI on CKD stage II -Secondary to sepsis, continue IV fluid x 10 hours then reevaluate.  Hyperkalemia -Secondary to AKI, on IV fluid -1 dose of Lokelma given.  Recheck K level tomorrow  Acute on chronic hypoxic respiratory failure secondary to recurrent right-sided pleural effusion -Reviewed pathology report with pathology, the cytospin you did limited amount of sample on last thoracentesis and pleural effusion samples.  Which only showed atypical cells.  Discussed with pathology, we will send pleural fluid sample today for cytospin and pathology study -Clinically suspect the pleural effusion is malignant as the chemistry study on the pleural fluid showed exudate.  Chemistry and pathology report were discussed with patient daughter over the phone. -Communicated with nurse and put order in for drainage from right side chest Pleurx catheter and cytospin and cytology study.  Left arm melanoma status post excision -Family reported that the patient declined immunotherapy -Patient is DNR  COPD -Stable  DVT prophylaxis: Lovenox Code Status: DNR Family Communication: Daughter over the phone Disposition Plan:  Patient is sick with acute hypoxia and sepsis, requiring inpatient management IV antibiotics Consults called: None Admission status: PCU   Emeline General MD [facility] Pager [phone]  03/13/2023, 2:16 PM

## 2023-03-13 NOTE — Consult Note (Addendum)
WOC Nurse Consult Note:  Consult requested for left arm.  Pt is familiar to [facility] team from recent admission on 9/17. Consult performed remotely after review of progress notes and photo in the EMR.   Patient underwent wide excision of melanoma sites  to the left forearm with placement of skin substitute by Dr. Donell Beers 02/14/2023; last wound care orders 9/12 for this area state to leave myriad in place, cover with surgilube, non stick dressing, roll gauze and Ace for compression.  Full thickness post-op wound is red and moist, mod amt tan drainage.  Measurement: 13.8 x 11 cm per Dr. Donell Beers note   Topical treatment orders provided for bedside nurses to perform as previously ordered by the surgical team:  Apply a generous amount of surgilube on top of blue green material covering wound, top with Telfa non-stick dressing, secure with Kerlix roll gauze.  May cover with Ace bandage for light compression to area.  Patient is to follow-up as scheduled with Dr. Arita Miss office.  Please re-consult if further assistance is needed.  Thank-you,  Cammie Mcgee MSN, RN, CWOCN, [place], CNS [phone]

## 2023-03-13 NOTE — Consult Note (Signed)
Pharmacy Antibiotic Note  Aaron Gibbs is a 88 y.o. male admitted on 03/13/2023 with  wound infection . PMH significant for blindness 2/2 to retinitis pigmentosa, chronic pain, COPD, CHF, depression, anxiety. Patient received IV ceftriaxone and doxycycline earlier this week while admitted for pleural effusion. Pharmacy has been consulted for cefepime dosing.  Plan: Day 1 of antibiotics Start cefepime 2 g IV Q12H Continue to monitor renal function and follow culture results  Height: 5\' 10"  (177.8 cm) Weight: 98 kg (216 lb 0.8 oz) IBW/kg (Calculated) : 73  Temp (24hrs), Avg:98.1 F (36.7 C), Min:97.6 F (36.4 C), Max:98.5 F (36.9 C)  Recent Labs  Lab 03/07/23 0629 03/08/23 0303 03/09/23 0607 03/13/23 0740 03/13/23 1057  WBC 5.5  --   --  11.7*  --   CREATININE 1.42* 1.28* 1.00 1.88*  --   LATICACIDVEN  --   --   --  1.7 2.2*    Estimated Creatinine Clearance: 33.7 mL/min (A) (by C-G formula based on SCr of 1.88 mg/dL (H)).    Allergies  Allergen Reactions   Anthralin Shortness Of Breath   Azithromycin Shortness Of Breath    Antimicrobials this admission: 9/24 Vancomycin x1 9/24 Cefepime >>   Dose adjustments this admission: N/A  Microbiology results: 9/24 BCx: IP  Thank you for allowing pharmacy to be a part of this patient's care.  Celene Squibb, PharmD Clinical Pharmacist 03/13/2023 12:43 PM

## 2023-03-13 NOTE — ED Notes (Signed)
Lab called to obtain 2nd set of cultures

## 2023-03-13 NOTE — ED Notes (Signed)
Admission MD at bedside.

## 2023-03-14 ENCOUNTER — Inpatient Hospital Stay

## 2023-03-14 ENCOUNTER — Ambulatory Visit (HOSPITAL_BASED_OUTPATIENT_CLINIC_OR_DEPARTMENT_OTHER): Payer: Medicare Other | Admitting: Pain Medicine

## 2023-03-14 DIAGNOSIS — J90 Pleural effusion, not elsewhere classified: Secondary | ICD-10-CM

## 2023-03-14 DIAGNOSIS — Z79899 Other long term (current) drug therapy: Secondary | ICD-10-CM | POA: Insufficient documentation

## 2023-03-14 DIAGNOSIS — Z91199 Patient's noncompliance with other medical treatment and regimen due to unspecified reason: Secondary | ICD-10-CM

## 2023-03-14 LAB — BASIC METABOLIC PANEL
Anion gap: 1 — ABNORMAL LOW (ref 5–15)
BUN: 28 mg/dL — ABNORMAL HIGH (ref 8–23)
CO2: 28 mmol/L (ref 22–32)
Calcium: 7.9 mg/dL — ABNORMAL LOW (ref 8.9–10.3)
Chloride: 108 mmol/L (ref 98–111)
Creatinine, Ser: 1.29 mg/dL — ABNORMAL HIGH (ref 0.61–1.24)
GFR, Estimated: 54 mL/min — ABNORMAL LOW (ref >60)
Glucose, Bld: 127 mg/dL — ABNORMAL HIGH (ref 70–99)
Potassium: 5.1 mmol/L (ref 3.5–5.1)
Sodium: 137 mmol/L (ref 135–145)

## 2023-03-14 LAB — CBC
HCT: 28.8 % — ABNORMAL LOW (ref 39.0–52.0)
Hemoglobin: 8.7 g/dL — ABNORMAL LOW (ref 13.0–17.0)
MCH: 28.7 pg (ref 26.0–34.0)
MCHC: 30.2 g/dL (ref 30.0–36.0)
MCV: 95 fL (ref 80.0–100.0)
Platelets: 146 10*3/uL — ABNORMAL LOW (ref 150–400)
RBC: 3.03 MIL/uL — ABNORMAL LOW (ref 4.22–5.81)
RDW: 15.6 % — ABNORMAL HIGH (ref 11.5–15.5)
WBC: 6 10*3/uL (ref 4.0–10.5)
nRBC: 0 % (ref 0.0–0.2)

## 2023-03-14 LAB — PROCALCITONIN: Procalcitonin: <0.1 ng/mL

## 2023-03-14 MED ORDER — HALOPERIDOL LACTATE 5 MG/ML IJ SOLN
2.0000 mg | Freq: Four times a day (QID) | INTRAMUSCULAR | Status: DC | PRN
  Administered 2023-03-14 (×2): 2 mg via INTRAVENOUS
  Filled 2023-03-14 (×2): qty 1

## 2023-03-14 MED ORDER — HALOPERIDOL LACTATE 5 MG/ML IJ SOLN
5.0000 mg | Freq: Four times a day (QID) | INTRAMUSCULAR | Status: DC | PRN
  Administered 2023-03-15 (×2): 5 mg via INTRAVENOUS
  Filled 2023-03-14 (×2): qty 1

## 2023-03-14 MED ORDER — SODIUM CHLORIDE 0.9 % IV BOLUS
250.0000 mL | Freq: Once | INTRAVENOUS | Status: AC
  Administered 2023-03-14: 250 mL via INTRAVENOUS

## 2023-03-14 MED ORDER — SIMETHICONE 80 MG PO CHEW: 160.0000 mg | CHEWABLE_TABLET | Freq: Once | ORAL | Status: DC

## 2023-03-14 MED ORDER — HALOPERIDOL LACTATE 5 MG/ML IJ SOLN
5.0000 mg | Freq: Once | INTRAMUSCULAR | Status: AC
  Administered 2023-03-14: 5 mg via INTRAVENOUS
  Filled 2023-03-14: qty 1

## 2023-03-14 MED ORDER — MORPHINE SULFATE (PF) 2 MG/ML IV SOLN
2.0000 mg | INTRAVENOUS | Status: DC | PRN
  Administered 2023-03-14 – 2023-03-15 (×4): 2 mg via INTRAVENOUS
  Filled 2023-03-14 (×4): qty 1

## 2023-03-14 NOTE — Progress Notes (Signed)
[facility] Liaison Note?     Mr. Makei Mitrovich is a current [facility] patient admitted 9.22.24 with a terminal diagnosis of Hypertensive Heart Disease with Heart Failure. He had a fall on the day of admission to hospice and since that time has been having deteriorating mental status. Hospice nurse was called to home this morning for PRN visit but decision was made to send him to the hospital due to hallucinations. He is being admitted with a diagnosis of AMS and Sepsis. Per Dr. Patric Dykes with [facility] this is a related hospital admission.    Visited at bedside with patient and caregiver.  Patient had just worked with PT for bed mobility.  Patient was exhausted afterwards.  Caregiver stated that patient does not seem any better today.     Vital Signs:98.8  130/77  92  22   83 % on 4L nasal cannula I/O:  not yet recorded Abnormal labs: glucose 127 BUN 28  Creatinine 1.29  Calcium 7.9  Anion gap 1  GFR 54  RBC 3.03  Hemoglobin 8.7 HCT 28.8  RDW 15.6  Platelets 146. ? EXAM: CHEST  1 VIEW   COMPARISON:  Yesterday   FINDINGS: Progressive opacification of the right chest with evidence of large layering pleural effusion causing veil like opacification. There is a tunneled pleural catheter overlapping the right lower lung. Patchy airspace type opacity at the left lung base. Largely obscured heart size further distorted by rightward rotation.   IMPRESSION: 1. Worsening aeration on the right primarily attributed to layering pleural effusion. Tunneled pleural catheter on right. 2. Airspace opacity at the left base suspicious for pneumonia.     Electronically Signed   By: Tiburcio Pea M.D.   On: 03/14/2023 07:41    CT HEAD WITHOUT CONTRAST  CT CERVICAL SPINE WITHOUT CONTRAST  TECHNIQUE: Multidetector CT imaging of the head and cervical spine was performed following the standard protocol without intravenous contrast. Multiplanar CT image  reconstructions of the cervical spine were also generated. RADIATION DOSE REDUCTION: This exam was performed according to the departmental dose-optimization program which includes automated exposure control, adjustment of the mA and/or kV according to patient size and/or use of iterative reconstruction technique.  COMPARISON:  CT scan head and cervical spine from 11/18/2022.  FINDINGS: CT HEAD FINDINGS CT CERVICAL SPINE FINDINGS  IMPRESSION: 1. No acute intracranial abnormality. 2. Redemonstration of patient's known odontoid fracture. No acute fracture or traumatic malalignment of the cervical spine.  Electronically Signed   By: Jules Schick M.D.   On: 03/13/2023 10:04   IV/PRN Meds: NS @ 178ml/H, Cefepime 2g IVPB q12H, LR 1L bolus x2, NS 1L bolus x1,  Vancomycin 1gram IVBP x1 maxipime 2g q 12 hr IV x2  desyrel 150mg  at bedtime po X 1 oxycodone 10mg  q 6hr prn po x 2 morphine 2mg  q 4hr prn IV x 1,  Ativan 0.5mg  3 times daily prn po x 1.   Problem list:?   Acute metabolic encephalopathy -Clinically suspect sepsis -Source from infection considered to be left arm ulcer infection/cellulitis -Plan to continue cefepime, check MRSA screening, hold off redosing vancomycin for now -Look for other source of infection, UA is pending. -Other DDx, daughter also expressed concern about polypharmacy as patient was recently started on Ativan for anxiety, will cut down Ativan dosage.  Also melanoma was diagnosed recently with a negative PET scan.  However if patient's mentation not recovering after all the above management, consider further brain image study  such as MRI and EEG. -Cut down Ativan from 5 mg to 0.5 mg   Sepsis  -Evidenced by leukocytosis, elevated lactic acid, with signs of endorgan damage of acute encephalopathy source of infection as of now considered to be left arm ulcer infection, starting cefepime as mentioned above -Clinically appears to be volume contracted, will continue IV  fluid   AKI on CKD stage II -Secondary to sepsis, continue IV fluid x 10 hours then reevaluate.   Hyperkalemia -Secondary to AKI, on IV fluid -1 dose of Lokelma given.  Recheck K level tomorrow   Acute on chronic hypoxic respiratory failure secondary to recurrent right-sided pleural effusion -Reviewed pathology report with pathology, the cytospin you did limited amount of sample on last thoracentesis and pleural effusion samples.  Which only showed atypical cells.  Discussed with pathology, we will send pleural fluid sample today for cytospin and pathology study -Clinically suspect the pleural effusion is malignant as the chemistry study on the pleural fluid showed exudate.  Chemistry and pathology report were discussed with patient daughter over the phone. -Communicated with nurse and put order in for drainage from right side chest Pleurx catheter and cytospin and cytology study.   Discharge Planning:? Ongoing Family contact: met with patient at bedside, along with his private duty caregiver.    IDT:? Updated?   Goals of Care: Ongoing Please call with any hospice related questions or concerns.  [facility] Liaison [phone]

## 2023-03-14 NOTE — Patient Instructions (Signed)
____________________________________________________________________________________________  Opioid Pain Medication Update  To: All patients taking opioid pain medications. (I.e.: hydrocodone, hydromorphone, oxycodone, oxymorphone, morphine, codeine, methadone, tapentadol, tramadol, buprenorphine, fentanyl, etc.)  Re: Updated review of side effects and adverse reactions of opioid analgesics, as well as new information about long term effects of this class of medications.  Direct risks of long-term opioid therapy are not limited to opioid addiction and overdose. Potential medical risks include serious fractures, breathing problems during sleep, hyperalgesia, immunosuppression, chronic constipation, bowel obstruction, myocardial infarction, and tooth decay secondary to xerostomia.  Unpredictable adverse effects that can occur even if you take your medication correctly: Cognitive impairment, respiratory depression, and death. Most people think that if they take their medication "correctly", and "as instructed", that they will be safe. Nothing could be farther from the truth. In reality, a significant amount of recorded deaths associated with the use of opioids has occurred in individuals that had taken the medication for a long time, and were taking their medication correctly. The following are examples of how this can happen: Patient taking his/her medication for a long time, as instructed, without any side effects, is given a certain antibiotic or another unrelated medication, which in turn triggers a "Drug-to-drug interaction" leading to disorientation, cognitive impairment, impaired reflexes, respiratory depression or an untoward event leading to serious bodily harm or injury, including death.  Patient taking his/her medication for a long time, as instructed, without any side effects, develops an acute impairment of liver and/or kidney function. This will lead to a rapid inability of the body to  breakdown and eliminate their pain medication, which will result in effects similar to an "overdose", but with the same medicine and dose that they had always taken. This again may lead to disorientation, cognitive impairment, impaired reflexes, respiratory depression or an untoward event leading to serious bodily harm or injury, including death.  A similar problem will occur with patients as they grow older and their liver and kidney function begins to decrease as part of the aging process.  Background information: Historically, the original case for using long-term opioid therapy to treat chronic noncancer pain was based on safety assumptions that subsequent experience has called into question. In 1996, the American Pain Society and the American Academy of Pain Medicine issued a consensus statement supporting long-term opioid therapy. This statement acknowledged the dangers of opioid prescribing but concluded that the risk for addiction was low; respiratory depression induced by opioids was short-lived, occurred mainly in opioid-naive patients, and was antagonized by pain; tolerance was not a common problem; and efforts to control diversion should not constrain opioid prescribing. This has now proven to be wrong. Experience regarding the risks for opioid addiction, misuse, and overdose in community practice has failed to support these assumptions.  According to the Centers for Disease Control and Prevention, fatal overdoses involving opioid analgesics have increased sharply over the past decade. Currently, more than 96,700 people die from drug overdoses every year. Opioids are a factor in 7 out of every 10 overdose deaths. Deaths from drug overdose have surpassed motor vehicle accidents as the leading cause of death for individuals between the ages of 80 and 61.  Clinical data suggest that neuroendocrine dysfunction may be very common in both men and women, potentially causing hypogonadism, erectile  dysfunction, infertility, decreased libido, osteoporosis, and depression. Recent studies linked higher opioid dose to increased opioid-related mortality. Controlled observational studies reported that long-term opioid therapy may be associated with increased risk for cardiovascular events. Subsequent meta-analysis concluded  that the safety of long-term opioid therapy in elderly patients has not been proven.   Side Effects and adverse reactions: Common side effects: Drowsiness (sedation). Dizziness. Nausea and vomiting. Constipation. Physical dependence -- Dependence often manifests with withdrawal symptoms when opioids are discontinued or decreased. Tolerance -- As you take repeated doses of opioids, you require increased medication to experience the same effect of pain relief. Respiratory depression -- This can occur in healthy people, especially with higher doses. However, people with COPD, asthma or other lung conditions may be even more susceptible to fatal respiratory impairment.  Uncommon side effects: An increased sensitivity to feeling pain and extreme response to pain (hyperalgesia). Chronic use of opioids can lead to this. Delayed gastric emptying (the process by which the contents of your stomach are moved into your small intestine). Muscle rigidity. Immune system and hormonal dysfunction. Quick, involuntary muscle jerks (myoclonus). Arrhythmia. Itchy skin (pruritus). Dry mouth (xerostomia).  Long-term side effects: Chronic constipation. Sleep-disordered breathing (SDB). Increased risk of bone fractures. Hypothalamic-pituitary-adrenal dysregulation. Increased risk of overdose.  RISKS: Respiratory depression and death: Opioids increase the risk of respiratory depression and death.  Drug-to-drug interactions: Opioids are relatively contraindicated in combination with benzodiazepines, sleep inducers, and other central nervous system depressants. Other classes of medications  (i.e.: certain antibiotics and even over-the-counter medications) may also trigger or induce respiratory depression in some patients.  Medical conditions: Patients with pre-existing respiratory problems are at higher risk of respiratory failure and/or depression when in combination with opioid analgesics. Opioids are relatively contraindicated in some medical conditions such as central sleep apnea.   Fractures and Falls:  Opioids increase the risk and incidence of falls. This is of particular importance in elderly patients.  Endocrine System:  Long-term administration is associated with endocrine abnormalities (endocrinopathies). (Also known as Opioid-induced Endocrinopathy) Influences on both the hypothalamic-pituitary-adrenal axis?and the hypothalamic-pituitary-gonadal axis have been demonstrated with consequent hypogonadism and adrenal insufficiency in both sexes. Hypogonadism and decreased levels of dehydroepiandrosterone sulfate have been reported in men and women. Endocrine effects include: Amenorrhoea in women (abnormal absence of menstruation) Reduced libido in both sexes Decreased sexual function Erectile dysfunction in men Hypogonadisms (decreased testicular function with shrinkage of testicles) Infertility Depression and fatigue Loss of muscle mass Anxiety Depression Immune suppression Hyperalgesia Weight gain Anemia Osteoporosis Patients (particularly women of childbearing age) should avoid opioids. There is insufficient evidence to recommend routine monitoring of asymptomatic patients taking opioids in the long-term for hormonal deficiencies.  Immune System: Human studies have demonstrated that opioids have an immunomodulating effect. These effects are mediated via opioid receptors both on immune effector cells and in the central nervous system. Opioids have been demonstrated to have adverse effects on antimicrobial response and anti-tumour surveillance. Buprenorphine has  been demonstrated to have no impact on immune function.  Opioid Induced Hyperalgesia: Human studies have demonstrated that prolonged use of opioids can lead to a state of abnormal pain sensitivity, sometimes called opioid induced hyperalgesia (OIH). Opioid induced hyperalgesia is not usually seen in the absence of tolerance to opioid analgesia. Clinically, hyperalgesia may be diagnosed if the patient on long-term opioid therapy presents with increased pain. This might be qualitatively and anatomically distinct from pain related to disease progression or to breakthrough pain resulting from development of opioid tolerance. Pain associated with hyperalgesia tends to be more diffuse than the pre-existing pain and less defined in quality. Management of opioid induced hyperalgesia requires opioid dose reduction.  Cancer: Chronic opioid therapy has been associated with an increased risk of cancer  among noncancer patients with chronic pain. This association was more evident in chronic strong opioid users. Chronic opioid consumption causes significant pathological changes in the small intestine and colon. Epidemiological studies have found that there is a link between opium dependence and initiation of gastrointestinal cancers. Cancer is the second leading cause of death after cardiovascular disease. Chronic use of opioids can cause multiple conditions such as GERD, immunosuppression and renal damage as well as carcinogenic effects, which are associated with the incidence of cancers.   Mortality: Long-term opioid use has been associated with increased mortality among patients with chronic non-cancer pain (CNCP).  Prescription of long-acting opioids for chronic noncancer pain was associated with a significantly increased risk of all-cause mortality, including deaths from causes other than overdose.  Reference: Von Korff M, Kolodny A, Deyo RA, Chou R. Long-term opioid therapy reconsidered. Ann Intern Med. 2011  Sep 6;155(5):325-8. doi: 10.7326/[id number]. PMID: 64403474; PMCID: [id number]. Randon Goldsmith, Hayward RA, Dunn KM, [place] KP. Risk of adverse events in patients prescribed long-term opioids: A cohort study in the [place] [facility] Research Datalink. Eur J Pain. 2019 May;23(5):908-922. doi: 10.1002/ejp.1357. Epub 2019 Jan 31. PMID: 43329518. Colameco S, Coren JS, Ciervo CA. Continuous opioid treatment for chronic noncancer pain: a time for moderation in prescribing. Postgrad Med. 2009 Jul;121(4):61-6. doi: 10.3810/pgm.2009.07.2032. PMID: 84166063. William Hamburger RN, [place] SD, Blazina I, Cristopher Peru, Bougatsos C, Deyo RA. The effectiveness and risks of long-term opioid therapy for chronic pain: a systematic review for a [employer] of [employer] Pathways to [employer]. Ann Intern Med. 2015 Feb 17;162(4):276-86. doi: 10.7326/M14-2559. PMID: 01601093. Caryl Bis [facility], Makuc DM. NCHS Data Brief No. 22. [place]: Centers for Disease Control and Prevention; 2009. Sep, Increase in Fatal Poisonings Involving Opioid Analgesics in the [place], 1999-2006. Song IA, Choi HR, Oh TK. Long-term opioid use and mortality in patients with chronic non-cancer pain: Ten-year follow-up study in [place] from 2010 through 2019. EClinicalMedicine. 2022 Jul 18;51:101558. doi: 10.1016/j.eclinm.2022.235573. PMID: 22025427; PMCID: [id number]. Huser, W., Schubert, T., Vogelmann, T. et al. All-cause mortality in patients with long-term opioid therapy compared with non-opioid analgesics for chronic non-cancer pain: a database study. BMC Med 18, 162 (2020). http://lester.info/ Rashidian H, Karie Kirks, Malekzadeh R, Haghdoost AA. An Ecological Study of the Association between Opiate Use and Incidence of Cancers. Addict Health. 2016 Fall;8(4):252-260. PMID: 51761607; PMCID: [id number].  Our Goal: Our goal is to control your  pain with means other than the use of opioid pain medications.  Our Recommendation: Talk to your physician about coming off of these medications. We can assist you with the tapering down and stopping these medicines. Based on the new information, even if you cannot completely stop the medication, a decrease in the dose may be associated with a lesser risk. Ask for other means of controlling the pain. Decrease or eliminate those factors that significantly contribute to your pain such as smoking, obesity, and a diet heavily tilted towards "inflammatory" nutrients.  Last Updated: 12/25/2022   ____________________________________________________________________________________________     ____________________________________________________________________________________________  National Pain Medication Shortage  The [place] is experiencing worsening drug shortages. These have had a negative widespread effect on patient care and treatment. Not expected to improve any time soon. Predicted to last past 2029.   Drug shortage list (generic names) Oxycodone IR Oxycodone/APAP Oxymorphone IR Hydromorphone Hydrocodone/APAP Morphine  Where is the problem?  Manufacturing and supply level.  Will this shortage affect you?  Only if you  take any of the above pain medications.  How? You may be unable to fill your prescription.  Your pharmacist may offer a "partial fill" of your prescription. (Warning: Do not accept partial fills.) Prescriptions partially filled cannot be transferred to another pharmacy. Read our Medication Rules and Regulation. Depending on how much medicine you are dependent on, you may experience withdrawals when unable to get the medication.  Recommendations: Consider ending your dependence on opioid pain medications. Ask your pain specialist to assist you with the process. Consider switching to a medication currently not in shortage, such as Buprenorphine. Talk to your pain  specialist about this option. Consider decreasing your pain medication requirements by managing tolerance thru "Drug Holidays". This may help minimize withdrawals, should you run out of medicine. Control your pain thru the use of non-pharmacological interventional therapies.   Your prescriber: Prescribers cannot be blamed for shortages. Medication manufacturing and supply issues cannot be fixed by the prescriber.   NOTE: The prescriber is not responsible for supplying the medication, or solving supply issues. Work with your pharmacist to solve it. The patient is responsible for the decision to take or continue taking the medication and for identifying and securing a legal supply source. By law, supplying the medication is the job and responsibility of the pharmacy. The prescriber is responsible for the evaluation, monitoring, and prescribing of these medications.   Prescribers will NOT: Re-issue prescriptions that have been partially filled. Re-issue prescriptions already sent to a pharmacy.  Re-send prescriptions to a different pharmacy because yours did not have your medication. Ask pharmacist to order more medicine or transfer the prescription to another pharmacy. (Read below.)  New 2023 regulation: "February 17, 2022 Revised Regulation Allows DEA-Registered Pharmacies to Transfer Electronic Prescriptions at a Patient's Request DEA Headquarters Division - Public Information Office Patients now have the ability to request their electronic prescription be transferred to another pharmacy without having to go back to their practitioner to initiate the request. This revised regulation went into effect on Monday, February 13, 2022.     At a patient's request, a DEA-registered retail pharmacy can now transfer an electronic prescription for a controlled substance (schedules II-V) to another DEA-registered retail pharmacy. Prior to this change, patients would have to go through their practitioner to  cancel their prescription and have it re-issued to a different pharmacy. The process was taxing and time consuming for both patients and practitioners.    The Drug Enforcement Administration [facility]) published its intent to revise the process for transferring electronic prescriptions on May 07, 2020.  The final rule was published in the federal register on January 12, 2022 and went into effect 30 days later.  Under the final rule, a prescription can only be transferred once between pharmacies, and only if allowed under existing state or other applicable law. The prescription must remain in its electronic form; may not be altered in any way; and the transfer must be communicated directly between two licensed pharmacists. It's important to note, any authorized refills transfer with the original prescription, which means the entire prescription will be filled at the same pharmacy".  Reference: [email or web address].is [facility] website announcement)  [email or web address] [employer] of [employer])   [employer] / Vol. 88, No. 143 / Thursday, January 12, 2022 / Rules and Regulations DEPARTMENT OF JUSTICE  Drug Enforcement Administration  21 CFR Part 1306  [Docket No. [id number]]  RIN [id number] Transfer of Electronic Prescriptions for Schedules II-V Controlled Substances Between Pharmacies for Initial Filling  ____________________________________________________________________________________________  ____________________________________________________________________________________________  Transfer of Pain Medication between Pharmacies  Re: 2023 DEA Clarification on existing regulation  Published on DEA Website: February 17, 2022  Title: Revised Regulation Allows DEA-Registered Pharmacies to Electrical engineer Prescriptions at a Patient's  Request DEA Headquarters Division - [employer]  "Patients now have the ability to request their electronic prescription be transferred to another pharmacy without having to go back to their practitioner to initiate the request. This revised regulation went into effect on Monday, February 13, 2022.     At a patient's request, a DEA-registered retail pharmacy can now transfer an electronic prescription for a controlled substance (schedules II-V) to another DEA-registered retail pharmacy. Prior to this change, patients would have to go through their practitioner to cancel their prescription and have it re-issued to a different pharmacy. The process was taxing and time consuming for both patients and practitioners.    The Drug Enforcement Administration [facility]) published its intent to revise the process for transferring electronic prescriptions on May 07, 2020.  The final rule was published in the federal register on January 12, 2022 and went into effect 30 days later.  Under the final rule, a prescription can only be transferred once between pharmacies, and only if allowed under existing state or other applicable law. The prescription must remain in its electronic form; may not be altered in any way; and the transfer must be communicated directly between two licensed pharmacists. It's important to note, any authorized refills transfer with the original prescription, which means the entire prescription will be filled at the same pharmacy."    REFERENCES: 1. DEA website announcement [email or web address]  2. Department of Justice website  [email or web address]  3. DEPARTMENT OF JUSTICE Drug Enforcement Administration 21 CFR Part 1306 [Docket No. [id number]] RIN [id number] "Transfer of Electronic Prescriptions for Schedules II-V Controlled Substances  Between Pharmacies for Initial Filling"  ____________________________________________________________________________________________     _______________________________________________________________________  Medication Rules  Purpose: To inform patients, and their family members, of our medication rules and regulations.  Applies to: All patients receiving prescriptions from our practice (written or electronic).  Pharmacy of record: This is the pharmacy where your electronic prescriptions will be sent. Make sure we have the correct one.  Electronic prescriptions: In compliance with the [facility] Strengthen Opioid Misuse Prevention (STOP) Act of 2017 (Session Conni Elliot [phone]), effective June 19, 2018, all controlled substances must be electronically prescribed. Written prescriptions, faxing, or calling prescriptions to a pharmacy will no longer be done.  Prescription refills: These will be provided only during in-person appointments. No medications will be renewed without a "face-to-face" evaluation with your provider. Applies to all prescriptions.  NOTE: The following applies primarily to controlled substances (Opioid* Pain Medications).   Type of encounter (visit): For patients receiving controlled substances, face-to-face visits are required. (Not an option and not up to the patient.)  Patient's responsibilities: Pain Pills: Bring all pain pills to every appointment (except for procedure appointments). Pill Bottles: Bring pills in original pharmacy bottle. Bring bottle, even if empty. Always bring the bottle of the most recent fill.  Medication refills: You are responsible for knowing and keeping track of what medications you are taking and when is it that you will need a refill. The day before your appointment: write a list of all prescriptions that need to be refilled. The day of the appointment: give the list to the admitting nurse. Prescriptions will be written only  during appointments. No prescriptions will be written on procedure days. If you forget a  medication: it will not be "Called in", "Faxed", or "electronically sent". You will need to get another appointment to get these prescribed. No early refills. Do not call asking to have your prescription filled early. Partial  or short prescriptions: Occasionally your pharmacy may not have enough pills to fill your prescription.  NEVER ACCEPT a partial fill or a prescription that is short of the total amount of pills that you were prescribed.  With controlled substances the law allows 72 hours for the pharmacy to complete the prescription.  If the prescription is not completed within 72 hours, the pharmacist will require a new prescription to be written. This means that you will be short on your medicine and we WILL NOT send another prescription to complete your original prescription.  Instead, request the pharmacy to send a carrier to a nearby branch to get enough medication to provide you with your full prescription. Prescription Accuracy: You are responsible for carefully inspecting your prescriptions before leaving our office. Have the discharge nurse carefully go over each prescription with you, before taking them home. Make sure that your name is accurately spelled, that your address is correct. Check the name and dose of your medication to make sure it is accurate. Check the number of pills, and the written instructions to make sure they are clear and accurate. Make sure that you are given enough medication to last until your next medication refill appointment. Taking Medication: Take medication as prescribed. When it comes to controlled substances, taking less pills or less frequently than prescribed is permitted and encouraged. Never take more pills than instructed. Never take the medication more frequently than prescribed.  Inform other Doctors: Always inform, all of your healthcare providers, of all the  medications you take. Pain Medication from other Providers: You are not allowed to accept any additional pain medication from any other Doctor or Healthcare provider. There are two exceptions to this rule. (see below) In the event that you require additional pain medication, you are responsible for notifying us, as stated below. Cough Medicine: Often these contain an opioid, such as codeine or hydrocodone. Never accept or take cough medicine containing these opioids if you are already taking an opioid* medication. The combination may cause respiratory failure and death. Medication Agreement: You are responsible for carefully reading and following our Medication Agreement. This must be signed before receiving any prescriptions from our practice. Safely store a copy of your signed Agreement. Violations to the Agreement will result in no further prescriptions. (Additional copies of our Medication Agreement are available upon request.) Laws, Rules, & Regulations: All patients are expected to follow all [address] and [employer], Rules, [place] [place]. Ignorance of the Laws does not constitute a valid excuse.  Illegal drugs and Controlled Substances: The use of illegal substances (including, but not limited to marijuana and its derivatives) and/or the illegal use of any controlled substances is strictly prohibited. Violation of this rule may result in the immediate and permanent discontinuation of any and all prescriptions being written by our practice. The use of any illegal substances is prohibited. Adopted CDC guidelines & recommendations: Target dosing levels will be at or below 60 MME/day. Use of benzodiazepines** is not recommended.  Exceptions: There are only two exceptions to the rule of not receiving pain medications from other Healthcare Providers. Exception #1 (Emergencies): In the event of an emergency (i.e.: accident requiring emergency care), you are allowed to receive additional pain  medication. However, you are responsible for: As soon as  you are able, call our office [phone], at any time of the day or night, and leave a message stating your name, the date and nature of the emergency, and the name and dose of the medication prescribed. In the event that your call is answered by a member of our staff, make sure to document and save the date, time, and the name of the person that took your information.  Exception #2 (Planned Surgery): In the event that you are scheduled by another doctor or dentist to have any type of surgery or procedure, you are allowed (for a period no longer than 30 days), to receive additional pain medication, for the acute post-op pain. However, in this case, you are responsible for picking up a copy of our "Post-op Pain Management for Surgeons" handout, and giving it to your surgeon or dentist. This document is available at our office, and does not require an appointment to obtain it. Simply go to our office during business hours (Monday-Thursday from 8:00 AM to 4:00 PM) (Friday 8:00 AM to 12:00 Noon) or if you have a scheduled appointment with [place], prior to your surgery, and ask for it by name. In addition, you are responsible for: calling our office [phone], at any time of the day or night, and leaving a message stating your name, name of your surgeon, type of surgery, and date of procedure or surgery. Failure to comply with your responsibilities may result in termination of therapy involving the controlled substances. Medication Agreement Violation. Following the above rules, including your responsibilities will help you in avoiding a Medication Agreement Violation ("Breaking your Pain Medication Contract").  Consequences:  Not following the above rules may result in permanent discontinuation of medication prescription therapy.  *Opioid medications include: morphine, codeine, oxycodone, oxymorphone, hydrocodone, hydromorphone, meperidine, tramadol,  tapentadol, buprenorphine, fentanyl, methadone. **Benzodiazepine medications include: diazepam (Valium), alprazolam (Xanax), clonazepam (Klonopine), lorazepam (Ativan), clorazepate (Tranxene), chlordiazepoxide (Librium), estazolam (Prosom), oxazepam (Serax), temazepam (Restoril), triazolam (Halcion) (Last updated: 04/11/2022) ______________________________________________________________________    ______________________________________________________________________  Medication Recommendations and Reminders  Applies to: All patients receiving prescriptions (written and/or electronic).  Medication Rules & Regulations: You are responsible for reading, knowing, and following our "Medication Rules" document. These exist for your safety and that of others. They are not flexible and neither are we. Dismissing or ignoring them is an act of "non-compliance" that may result in complete and irreversible termination of such medication therapy. For safety reasons, "non-compliance" will not be tolerated. As with the [place] fundamental legal principle of "ignorance of the law is no defense", we will accept no excuses for not having read and knowing the content of documents provided to you by our practice.  Pharmacy of record:  Definition: This is the pharmacy where your electronic prescriptions will be sent.  We do not endorse any particular pharmacy. It is up to you and your insurance to decide what pharmacy to use.  We do not restrict you in your choice of pharmacy. However, once we write for your prescriptions, we will NOT be re-sending more prescriptions to fix restricted supply problems created by your pharmacy, or your insurance.  The pharmacy listed in the electronic medical record should be the one where you want electronic prescriptions to be sent. If you choose to change pharmacy, simply notify our nursing staff. Changes will be made only during your regular appointments and not over the  phone.  Recommendations: Keep all of your pain medications in a safe place, under lock and key, even  if you live alone. We will NOT replace lost, stolen, or damaged medication. We do not accept "Police Reports" as proof of medications having been stolen. After you fill your prescription, take 1 week's worth of pills and put them away in a safe place. You should keep a separate, properly labeled bottle for this purpose. The remainder should be kept in the original bottle. Use this as your primary supply, until it runs out. Once it's gone, then you know that you have 1 week's worth of medicine, and it is time to come in for a prescription refill. If you do this correctly, it is unlikely that you will ever run out of medicine. To make sure that the above recommendation works, it is very important that you make sure your medication refill appointments are scheduled at least 1 week before you run out of medicine. To do this in an effective manner, make sure that you do not leave the office without scheduling your next medication management appointment. Always ask the nursing staff to show you in your prescription , when your medication will be running out. Then arrange for the receptionist to get you a return appointment, at least 7 days before you run out of medicine. Do not wait until you have 1 or 2 pills left, to come in. This is very poor planning and does not take into consideration that we may need to cancel appointments due to bad weather, sickness, or emergencies affecting our staff. DO NOT ACCEPT A "Partial Fill": If for any reason your pharmacy does not have enough pills/tablets to completely fill or refill your prescription, do not allow for a "partial fill". The law allows the pharmacy to complete that prescription within 72 hours, without requiring a new prescription. If they do not fill the rest of your prescription within those 72 hours, you will need a separate prescription to fill the remaining  amount, which we will NOT provide. If the reason for the partial fill is your insurance, you will need to talk to the pharmacist about payment alternatives for the remaining tablets, but again, DO NOT ACCEPT A PARTIAL FILL, unless you can trust your pharmacist to obtain the remainder of the pills within 72 hours.  Prescription refills and/or changes in medication(s):  Prescription refills, and/or changes in dose or medication, will be conducted only during scheduled medication management appointments. (Applies to both, written and electronic prescriptions.) No refills on procedure days. No medication will be changed or started on procedure days. No changes, adjustments, and/or refills will be conducted on a procedure day. Doing so will interfere with the diagnostic portion of the procedure. No phone refills. No medications will be "called into the pharmacy". No Fax refills. No weekend refills. No Holliday refills. No after hours refills.  Remember:  Business hours are:  Monday to Thursday 8:00 AM to 4:00 PM Provider's Schedule: Delano Metz, MD - Appointments are:  Medication management: Monday and Wednesday 8:00 AM to 4:00 PM Procedure day: Tuesday and Thursday 7:30 AM to 4:00 PM Edward Jolly, MD - Appointments are:  Medication management: Tuesday and Thursday 8:00 AM to 4:00 PM Procedure day: Monday and Wednesday 7:30 AM to 4:00 PM (Last update: 04/11/2022) ______________________________________________________________________   ____________________________________________________________________________________________  Naloxone Nasal Spray  Why am I receiving this medication? [place] STOP ACT requires that all patients taking high dose opioids or at risk of opioids respiratory depression, be prescribed an opioid reversal agent, such as Naloxone (AKA: Narcan).  What is this medication? NALOXONE (  nal OX one) treats opioid overdose, which causes slow or shallow breathing,  severe drowsiness, or trouble staying awake. Call emergency services after using this medication. You may need additional treatment. Naloxone works by reversing the effects of opioids. It belongs to a group of medications called opioid blockers.  COMMON BRAND NAME(S): Kloxxado, Narcan  What should I tell my care team before I take this medication? They need to know if you have any of these conditions: Heart disease Substance use disorder An unusual or allergic reaction to naloxone, other medications, foods, dyes, or preservatives Pregnant or trying to get pregnant Breast-feeding  When to use this medication? This medication is to be used for the treatment of respiratory depression (less than 8 breaths per minute) secondary to opioid overdose.   How to use this medication? This medication is for use in the nose. Lay the person on their back. Support their neck with your hand and allow the head to tilt back before giving the medication. The nasal spray should be given into 1 nostril. After giving the medication, move the person onto their side. Do not remove or test the nasal spray until ready to use. Get emergency medical help right away after giving the first dose of this medication, even if the person wakes up. You should be familiar with how to recognize the signs and symptoms of a narcotic overdose. If more doses are needed, give the additional dose in the other nostril. Talk to your care team about the use of this medication in children. While this medication may be prescribed for children as young as newborns for selected conditions, precautions do apply.  Naloxone Overdosage: If you think you have taken too much of this medicine contact a poison control center or emergency room at once.  NOTE: This medicine is only for you. Do not share this medicine with others.  What if I miss a dose? This does not apply.  What may interact with this medication? This is only used during an  emergency. No interactions are expected during emergency use. This list may not describe all possible interactions. Give your health care provider a list of all the medicines, herbs, non-prescription drugs, or dietary supplements you use. Also tell them if you smoke, drink alcohol, or use illegal drugs. Some items may interact with your medicine.  What should I watch for while using this medication? Keep this medication ready for use in the case of an opioid overdose. Make sure that you have the phone number of your care team and local hospital ready. You may need to have additional doses of this medication. Each nasal spray contains a single dose. Some emergencies may require additional doses. After use, bring the treated person to the nearest hospital or call 911. Make sure the treating care team knows that the person has received a dose of this medication. You will receive additional instructions on what to do during and after use of this medication before an emergency occurs.  What side effects may I notice from receiving this medication? Side effects that you should report to your care team as soon as possible: Allergic reactions--skin rash, itching, hives, swelling of the face, lips, tongue, or throat Side effects that usually do not require medical attention (report these to your care team if they continue or are bothersome): Constipation Dryness or irritation inside the nose Headache Increase in blood pressure Muscle spasms Stuffy nose Toothache This list may not describe all possible side effects. Call your doctor for  medical advice about side effects. You may report side effects to FDA at 1-800-FDA-1088.  Where should I keep my medication? Because this is an emergency medication, you should keep it with you at all times.  Keep out of the reach of children and pets. Store between 20 and 25 degrees C (68 and 77 degrees F). Do not freeze. Throw away any unused medication after the  expiration date. Keep in original box until ready to use.  NOTE: This sheet is a summary. It may not cover all possible information. If you have questions about this medicine, talk to your doctor, pharmacist, or health care provider.   2023 Elsevier/Gold Standard (2021-02-11 00:00:00)  ____________________________________________________________________________________________

## 2023-03-14 NOTE — Progress Notes (Signed)
PROGRESS NOTE    Aaron Gibbs  VOZ:[id number] DOB: 10-16-1934 DOA: 03/13/2023 PCP: Smitty Cords, DO  [id number]  LOS: 1 day   Brief hospital course:   Assessment & Plan: Aaron Gibbs is a 88 y.o. 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   Patient does not remember much of the intensity and only complain about feeling weak and uncontrolled chronic pain " everywhere" including shoulder, back and legs.  Caregiver called EMS today for patient has had multiple episodes of visual hallucinations.  Recently patient was diagnosed with large right-sided pleural fusion which was drained and Pleurx catheter was placed.  On arrival in the ED it was found patient was hypoxic with O2 saturation 70s patient was placed on 10 L of oxygen.   Acute metabolic encephalopathy Hospital delirium -initially thought to be due to sepsis, also could be delirium. -Other DDx, daughter also expressed concern about polypharmacy as patient was recently started on Ativan for anxiety --pt is blind, with pain and respiratory distress, at very high risk for delirium.  Pt was found to be agitated and trying to get out of bed.  Received several sedating meds with no lasting calming effects Plan: -Cut down Ativan from 5 mg to 0.5 mg --cont pain management --already on home oral Zyprexa --IV haldol PRN --No restraints, per daughter   Sepsis, ruled out --does not meet criteria   AKI on CKD stage II -improved with IVF   Hyperkalemia -resolved with 1 dose of Lokelma given.     Acute on chronic hypoxic respiratory failure secondary to recurrent right-sided pleural effusion -limited amount of sample on last  thoracentesis and pleural effusion samples showed atypical cells.   -Clinically suspect the pleural effusion is malignant as the chemistry study on the pleural fluid showed exudate.   -~1L pleural fluid drained from pleureX can and sent for cytology on 9/24, confirmed with lab. --drain PRN --Continue supplemental O2 to keep sats >=92%, wean as tolerated  Left arm melanoma status post excision -Family reported that the patient declined immunotherapy -Patient is DNR --wound care per order   COPD -Stable  Chronic pain --cont buprenorphine patch --oxycodone and IV morphine PRN    DVT prophylaxis: Lovenox SQ Code Status: DNR  Family Communication: daughter updated on the phone today Level of care: Progressive Dispo:   The patient is from: home Anticipated d/c is to: home Anticipated d/c date is: 2-3 days   Subjective and Interval History:  Pt complained of dyspnea.  RN reported pt has been agitated and trying to get out of bed, despite multiple sedating meds.    Objective: Vitals:   03/14/23 0809 03/14/23 0938 03/14/23 1147 03/14/23 1552  BP: (!) 101/44  (!) 111/48 (!) 103/48  Pulse: 65 79 67 69  Resp:   16 16  Temp:   97.7 F (36.5 C) 97.6 F (36.4 C)  TempSrc:      SpO2: 91% 97% 100% 100%  Weight:      Height:        Intake/Output Summary (Last 24 hours) at 03/14/2023 1925 Last data filed at 03/14/2023 1900 Gross per 24 hour  Intake 260 ml  Output 750 ml  Net -490 ml   Filed Weights   03/13/23 0746  Weight: 98 kg    Examination:  Constitutional: NAD, AAOx3 HEENT: blind CV: No cyanosis.   RESP: normal respiratory effort, on RA ([place] above his nostrils)  SKIN: warm, dry Psych: depressed mood and affect.     Data Reviewed: I have personally reviewed labs and imaging studies  Time spent: 50 minutes  Darlin Priestly, MD [facility] If 7PM-7AM, please contact night-coverage 03/14/2023, 7:25 PM

## 2023-03-14 NOTE — Progress Notes (Signed)
Pt. Was very uncomfortable and agitated wearing the neck brace. MD was notified. Dr. Fran Lowes indicated, "currently no fracture seen on cervical spine so no need for the neck brace". Neck brace removed.

## 2023-03-14 NOTE — Progress Notes (Signed)
PT Cancellation Note  Patient Details Name: Aaron Gibbs MRN: [id number] DOB: 1934-09-02   Cancelled Treatment:    Reason Eval/Treat Not Completed: Fatigue/lethargy limiting ability to participate. Pt is received in bed with caregiver at bedside, unable to stay awake secondary to increased fatigue/tiredness. Caregiver reports Pt "just fell asleep not too long ago". Will re-attempt PT evaluation at a later time.   Mahogani Holohan 03/14/2023, 10:21 AM

## 2023-03-14 NOTE — Progress Notes (Signed)
CROSS COVER NOTE  NAME: Aaron Gibbs MRN: [id number] DOB : 1934/08/27    Concern as stated by nurse / staff    Contacted by nurse patient is difficult to keep in bed, reluctant to care and refusing meds (spitting meds out)    Pertinent findings on chart review: Patient with history blindness, chronic pain syndrome,on narcotics, chonic C2 fx, anxiety,/depression, chronic HFpEF, and COPD, recent dx left arm malignant melanoma, and recurrent right sided pleural effusion with pleurx drain in place (pleural fluid sent to lav for cytology 9/24 for suspected for malignancy). He was admitted with confusion with hallucinations at home as well as acute on chronic respiratory failure AKI and metabolic encephalopathy thought to be from polypharmacy with recent ativan addition to home regimen  Assessment and  Interventions   Assessment: Patient setting edge of bed. Was cooperative, admitted to not knowing where he was  Reassurance explanations and position of comfort provided Plan: Sitter Visually impaired precautions       Donnie Mesa NP [facility] Cross Cover 7pm-7am - check amion for availability Pager [phone]

## 2023-03-14 NOTE — Evaluation (Signed)
Occupational Therapy Evaluation Patient Details Name: Aaron Gibbs MRN: [id number] DOB: 06/20/1934 Today's Date: 03/14/2023   History of Present Illness 88 y.o. 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 Dx of sepsis, AMS.   Clinical Impression   Pt was seen for OT evaluation this date. Prior to hospital admission, pt was living with a 24/7 caregiver and recently admitted to hospice services. Caregiver was providing assist with all ADLs and pt's mobility had been very limited over the last few weeks. He was mostly W/C bound and able to SPT with help from caregiver to W/C, toilet, etc. He had recently received a hospital bed as well. Pt presents to acute OT demonstrating impaired ADL performance and functional mobility 2/2 AMS with hallucinations, sepsis, low vision (See OT problem list for additional functional deficits). Pt currently requires CGA for supine to sit at EOB with tactile cues d/t blindess, Mod A for sit to supine. He required Min/CGA for STS from EOB without bed elevated and was unable to perform any lateral stepping d/t weakness and instability. Pt stating "I feel unsteady and am weak." 02 sats remained WFL on 4-4/5L. He was seen a week ago in this hospital and was able to walk short distance with RW.  Pt would benefit from skilled OT services to address noted impairments and functional limitations (see below for any additional details) in order to maximize safety and independence while minimizing falls risk and caregiver burden. Do anticipate the need for follow up OT services upon acute hospital DC via hospice OT/HHOT.       If plan is discharge  home, recommend the following: A lot of help with bathing/dressing/bathroom;A lot of help with walking and/or transfers;Two people to help with walking and/or transfers;Help with stairs or ramp for entrance;Direct supervision/assist for medications management    Functional Status Assessment  Patient has had a recent decline in their functional status and demonstrates the ability to make significant improvements in function in a reasonable and predictable amount of time.  Equipment Recommendations  None recommended by OT    Recommendations for Other Services       Precautions / Restrictions Precautions Precautions: Fall Precaution Comments: blind Restrictions Weight Bearing Restrictions: No      Mobility Bed Mobility Overal bed mobility: Needs Assistance Bed Mobility: Rolling, Supine to Sit, Sit to Supine Rolling: Supervision   Supine to sit: Contact guard, Used rails, HOB elevated Sit to supine: Mod assist   General bed mobility comments: needed Max A x2 to scoot to [facility]    Transfers Overall transfer level: Needs assistance Equipment used: Rolling walker (2 wheels) Transfers: Sit to/from Stand Sit to Stand: Min assist, Contact guard assist (bed height not elevated)           General transfer comment: attempted side stepping to the L towards HOB, but pt unable stating "I feel unsteady, I'm just weak."      Balance Overall balance assessment: Needs assistance Sitting-balance support: Feet supported, Single extremity supported Sitting balance-Leahy Scale: Poor Sitting balance - Comments: occasional assistance with seated balance as pt fatigued/seemed to go in/out needin verbal or tactile cues to arouse again   Standing balance support: Bilateral upper extremity supported Standing balance-Leahy Scale: Fair  ADL either performed or assessed with clinical judgement   ADL                                                Vision Baseline Vision/History: 2 Legally blind       Perception         Praxis         Pertinent Vitals/Pain Pain Assessment Pain Assessment: No/denies pain     Extremity/Trunk Assessment Upper Extremity Assessment Upper Extremity Assessment: Generalized weakness   Lower Extremity Assessment Lower Extremity Assessment: Generalized weakness       Communication Communication Communication: No apparent difficulties   Cognition Arousal: Alert Behavior During Therapy: WFL for tasks assessed/performed Overall Cognitive Status: Within Functional Limits for tasks assessed                                       General Comments  sp02>90% on 4.5L with standing/bed mobility, etc.    Exercises     Shoulder Instructions      Home Living Family/patient expects to be discharged to:: Private residence Living Arrangements: Alone Available Help at Discharge: Personal care attendant;Available 24 hours/day Type of Home: House       Home Layout: One level     Bathroom Shower/Tub: Walk-in shower         Home Equipment: BSC/3in1;Wheelchair - Therapist, sports   Additional Comments: caregiver verified information      Prior Functioning/Environment Prior Level of Function : Needs assist             Mobility Comments: caregiver assists with w/c transfer to toilet; ambulatory prior to recent admission but minimal walking in last 2 weeks ADLs Comments: caregiver assists with all aspects due to low vision        OT Problem List: Decreased strength;Decreased range of motion;Decreased activity tolerance;Impaired balance (sitting and/or standing);Decreased safety awareness;Impaired vision/perception      OT Treatment/Interventions: Self-care/ADL training;Therapeutic exercise;Therapeutic activities;Energy conservation;DME and/or AE instruction;Balance training;Patient/family education    OT Goals(Current goals can be found in the care  plan section) Acute Rehab OT Goals Patient Stated Goal: go return home OT Goal Formulation: With patient/family Time For Goal Achievement: 03/28/23 Potential to Achieve Goals: Fair ADL Goals Pt Will Perform Grooming: with supervision;with contact guard assist;sitting Pt Will Transfer to Toilet: stand pivot transfer;with mod assist Additional ADL Goal #1: Pt will performed bed mobility inculding supine<>sit at EOB with SBA and verb/tactile cues as needed d/t legal blindess.  OT Frequency: Min 1X/week    Co-evaluation              AM-PAC OT "6 Clicks" Daily Activity     Outcome Measure Help from another person eating meals?: None Help from another person taking care of personal grooming?: A Little Help from another person toileting, which includes using toliet, bedpan, or urinal?: A Lot Help from another person bathing (including washing, rinsing, drying)?: A Lot Help from another person to put on and taking off regular upper body clothing?: A Lot Help from another person to put on and taking off regular lower body clothing?: A Lot 6 Click Score: 15   End of Session Equipment Utilized During Treatment: Rolling walker (2 wheels) Nurse Communication: Mobility status  Activity Tolerance: Patient tolerated treatment  well;Patient limited by fatigue;Patient limited by lethargy Patient left: in bed;with call bell/phone within reach;with family/visitor present  OT Visit Diagnosis: Other abnormalities of gait and mobility (R26.89);Muscle weakness (generalized) (M62.81)                Time: 1610-9604 OT Time Calculation (min): 21 min Charges:  OT General Charges $OT Visit: 1 Visit OT Evaluation $OT Eval Moderate Complexity: 1 Mod OT Treatments $Therapeutic Activity: 8-22 mins  Charleston Hankin, OTR/L 03/14/2023, 10:00 AM

## 2023-03-14 NOTE — Progress Notes (Signed)
Called and left a message on machine for pt. Daughter Rayven Ugolini.

## 2023-03-15 DIAGNOSIS — R410 Disorientation, unspecified: Secondary | ICD-10-CM

## 2023-03-15 LAB — CBC
HCT: 27 % — ABNORMAL LOW (ref 39.0–52.0)
Hemoglobin: 8.1 g/dL — ABNORMAL LOW (ref 13.0–17.0)
MCH: 28.3 pg (ref 26.0–34.0)
MCHC: 30 g/dL (ref 30.0–36.0)
MCV: 94.4 fL (ref 80.0–100.0)
Platelets: 134 10*3/uL — ABNORMAL LOW (ref 150–400)
RBC: 2.86 MIL/uL — ABNORMAL LOW (ref 4.22–5.81)
RDW: 15.5 % (ref 11.5–15.5)
WBC: 4.7 10*3/uL (ref 4.0–10.5)
nRBC: 0 % (ref 0.0–0.2)

## 2023-03-15 LAB — BASIC METABOLIC PANEL
Anion gap: 3 — ABNORMAL LOW (ref 5–15)
BUN: 25 mg/dL — ABNORMAL HIGH (ref 8–23)
CO2: 26 mmol/L (ref 22–32)
Calcium: 8 mg/dL — ABNORMAL LOW (ref 8.9–10.3)
Chloride: 107 mmol/L (ref 98–111)
Creatinine, Ser: 1.02 mg/dL (ref 0.61–1.24)
GFR, Estimated: >60 mL/min (ref >60)
Glucose, Bld: 120 mg/dL — ABNORMAL HIGH (ref 70–99)
Potassium: 5.2 mmol/L — ABNORMAL HIGH (ref 3.5–5.1)
Sodium: 136 mmol/L (ref 135–145)

## 2023-03-15 LAB — CULTURE, BLOOD (ROUTINE X 2)
Culture: NO GROWTH
Special Requests: ADEQUATE

## 2023-03-15 LAB — MAGNESIUM: Magnesium: 2.2 mg/dL (ref 1.7–2.4)

## 2023-03-15 LAB — MRSA NEXT GEN BY PCR, NASAL: MRSA by PCR Next Gen: NOT DETECTED

## 2023-03-15 MED ORDER — MUPIROCIN 2 % EX OINT
TOPICAL_OINTMENT | Freq: Every day | CUTANEOUS | Status: DC
  Filled 2023-03-15: qty 22

## 2023-03-15 MED ORDER — MORPHINE SULFATE (PF) 4 MG/ML IV SOLN
4.0000 mg | Freq: Once | INTRAVENOUS | Status: AC
  Administered 2023-03-15: 4 mg via INTRAVENOUS
  Filled 2023-03-15: qty 1

## 2023-03-15 MED ORDER — HALOPERIDOL LACTATE 5 MG/ML IJ SOLN
5.0000 mg | Freq: Once | INTRAMUSCULAR | Status: AC | PRN
  Administered 2023-03-15: 5 mg via INTRAVENOUS
  Filled 2023-03-15: qty 1

## 2023-03-15 MED ORDER — CEPHALEXIN 500 MG PO CAPS: 500.0000 mg | ORAL_CAPSULE | Freq: Four times a day (QID) | ORAL | 0 refills | Status: AC

## 2023-03-15 MED ORDER — MELATONIN 5 MG PO CAPS: 15.0000 mg | ORAL_CAPSULE | Freq: Every day | ORAL | Status: DC

## 2023-03-15 MED ORDER — MORPHINE SULFATE 15 MG PO TABS
15.0000 mg | ORAL_TABLET | Freq: Four times a day (QID) | ORAL | 0 refills | Status: AC | PRN
Start: 2023-03-15 — End: 2023-03-22

## 2023-03-15 MED ORDER — QUETIAPINE FUMARATE 50 MG PO TABS: 50.0000 mg | ORAL_TABLET | Freq: Two times a day (BID) | ORAL | 0 refills | Status: DC | PRN

## 2023-03-15 MED ORDER — MUPIROCIN 2 % EX OINT: TOPICAL_OINTMENT | Freq: Every day | CUTANEOUS | 0 refills | Status: DC

## 2023-03-15 MED ORDER — MORPHINE SULFATE (PF) 4 MG/ML IV SOLN
4.0000 mg | Freq: Once | INTRAVENOUS | Status: AC | PRN
  Administered 2023-03-15: 4 mg via INTRAVENOUS
  Filled 2023-03-15: qty 1

## 2023-03-15 MED ORDER — CEFAZOLIN SODIUM-DEXTROSE 1-4 GM/50ML-% IV SOLN
1.0000 g | Freq: Three times a day (TID) | INTRAVENOUS | Status: DC
  Administered 2023-03-15: 1 g via INTRAVENOUS
  Filled 2023-03-15 (×2): qty 50

## 2023-03-15 NOTE — Discharge Summary (Addendum)
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 Left vertebral artery remains patent. 2. Chronic C2 fracture. 3. Partially imaged right pleural effusion with overlying opacities, better characterized on recent CT chest. 4. Limited study due to patient positioning. Electronically Signed   By: Feliberto Harts M.D.   On: 02/19/2023 13:12   ECHOCARDIOGRAM COMPLETE  Result Date: 02/19/2023    ECHOCARDIOGRAM REPORT   Patient Name:   Aaron Gibbs Date of Exam: 02/18/2023 Medical Rec #:  [id number]        Height:       71.0 in Accession #:    [id number]       Weight:       213.8 lb Date of Birth:  [birth year]        BSA:          2.169 m Patient Age:    87 years         BP:           141/87 mmHg Patient  Gender: M                HR:           68 bpm. Exam Location:  [facility] Procedure: 2D Echo, Cardiac Doppler and Color Doppler Indications:     Syncope R55  History:         Patient has no prior history of Echocardiogram examinations.                  CHF; Risk Factors:Hypertension.  Sonographer:     Neysa Bonito Roar Referring Phys:  [id number] Andris Baumann Diagnosing Phys: Yvonne Kendall MD IMPRESSIONS  1. Left ventricular ejection fraction, by estimation, is 55 to 60%. The left ventricle has normal function. The left ventricle has no regional wall motion abnormalities. Left ventricular diastolic parameters are indeterminate.  2. Right ventricular systolic function is normal. The right ventricular size is normal. Tricuspid regurgitation signal is inadequate for assessing PA pressure.  3. Left atrial size was moderately dilated.  4. The mitral valve is grossly normal. Trivial mitral valve regurgitation. No evidence of mitral stenosis.  5. The aortic valve was not well visualized. There is mild calcification of the aortic valve. There is mild thickening of the aortic valve. Aortic valve regurgitation is mild to moderate. Aortic valve sclerosis/calcification is present, without any evidence of aortic stenosis.  6. Aortic dilatation noted. There is borderline dilatation of the aortic root, measuring 40 mm. FINDINGS  Left Ventricle: Left ventricular ejection fraction, by estimation, is 55 to 60%. The left ventricle has normal function. The left ventricle has no regional wall motion abnormalities. The left ventricular internal cavity size was normal in size. There is  borderline left ventricular hypertrophy. Left ventricular diastolic parameters are indeterminate. Right Ventricle: The right ventricular size is normal. Right vetricular wall thickness was not well visualized. Right ventricular systolic function is normal. Tricuspid regurgitation signal is inadequate for assessing PA pressure. Left Atrium: Left atrial size was  moderately dilated. Right Atrium: Right atrial size was normal in size. Pericardium: The pericardium was not well visualized. Mitral Valve: The mitral valve is grossly normal. Trivial mitral valve regurgitation. No evidence of mitral valve stenosis. MV peak gradient, 2.9 mmHg. The mean mitral valve gradient is 2.0 mmHg. Tricuspid Valve: The tricuspid valve is not well visualized. Tricuspid valve regurgitation is trivial. Aortic Valve: The aortic valve was not well visualized. There is mild calcification of the aortic valve. There is mild thickening of the aortic valve. Aortic  Physician Discharge Summary   Aaron Gibbs  male DOB: 1935-02-28  QIO:[id number]  PCP: Smitty Cords, DO  Admit date: 03/13/2023 Discharge date: 03/15/2023  Admitted From: home Disposition:  home Daughter updated on the phone prior to discharge.  CODE STATUS: DNR  Discharge Instructions     Discharge instructions   Complete by: As directed    Please finish 4 more days of oral antibiotic Keflex to protect your skin wound.  Coreg and Benicar are discontinued because your blood pressure was low normal without them.  Ativan can cause more confusion.  I have ordered Seroquel to try for agitation. - -   Discharge wound care:   Complete by: As directed    Cleanse wound to left forearm with water and pat dry. Apply mupirocin to the wound bed, then telfa or other contact layer on top.  Cover with dry gauze and kerlix  May secure with ace bandage.  Change daily. [facility] Course:  For full details, please see H&P, progress notes, consult notes and ancillary notes.  Briefly,  Aaron Gibbs is a 88 y.o. 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   Patient only complained about feeling weak and uncontrolled chronic pain " everywhere" including shoulder, back and legs.  Caregiver called EMS for patient having multiple episodes of visual hallucinations.  Recently patient was diagnosed with recurrent large right-sided pleural fusion which was drained and Pleurx catheter was placed.   On arrival in the ED it was found patient was hypoxic with O2 saturation 70s patient was placed on 10 L of oxygen.    Acute metabolic encephalopathy Hospital delirium -initially thought  to be due to sepsis, also could be delirium. -daughter also expressed concern about polypharmacy as patient was recently started on Ativan for anxiety, which can worsen risk of delirium. --pt is blind, with pain and respiratory distress, at very high risk for delirium.  Pt was found to be agitated and trying to get out of bed, though was alert and oriented.  Received strong sedating meds with no lasting calming effects --cont home oral Zyprexa --Ativan d/c'ed on discharge, and Seroquel prescribed instead for PRN agitation. --cont pain management.  Morphine ordered at discharge, and daughter said hospice provider will continue pain management.   Acute on chronic hypoxic respiratory failure secondary to recurrent right-sided pleural effusion On 3-4L O2 at baseline -limited amount of sample on last thoracentesis on 03/06/23 and pleural effusion samples showed atypical cells.  Chemistry study on the pleural fluid showed exudate.   -~1L pleural fluid drained from pleureX can and sent for cytology on 9/24, confirmed with lab. --cont PleureX cath draining at home  --Pt was on 3-4L O2 prior to discharge.   Left arm melanoma status post excision -Family reported that the patient declined immunotherapy --wound care RN consulted, wound care per order --empiric started on cefepime for left arm cellulitis, however, unclear if true cellulitis.  Pt was discharged on 4 more days of Keflex to finish the abx course.      Chronic pain --pain is pt's main complaint.   --cont buprenorphine patch --discharged on morphine PRN for pain and dyspnea.  Hospice provider to continue management of pain.  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 Left vertebral artery remains patent. 2. Chronic C2 fracture. 3. Partially imaged right pleural effusion with overlying opacities, better characterized on recent CT chest. 4. Limited study due to patient positioning. Electronically Signed   By: Feliberto Harts M.D.   On: 02/19/2023 13:12   ECHOCARDIOGRAM COMPLETE  Result Date: 02/19/2023    ECHOCARDIOGRAM REPORT   Patient Name:   Aaron Gibbs Date of Exam: 02/18/2023 Medical Rec #:  [id number]        Height:       71.0 in Accession #:    [id number]       Weight:       213.8 lb Date of Birth:  [birth year]        BSA:          2.169 m Patient Age:    87 years         BP:           141/87 mmHg Patient  Gender: M                HR:           68 bpm. Exam Location:  [facility] Procedure: 2D Echo, Cardiac Doppler and Color Doppler Indications:     Syncope R55  History:         Patient has no prior history of Echocardiogram examinations.                  CHF; Risk Factors:Hypertension.  Sonographer:     Neysa Bonito Roar Referring Phys:  [id number] Andris Baumann Diagnosing Phys: Yvonne Kendall MD IMPRESSIONS  1. Left ventricular ejection fraction, by estimation, is 55 to 60%. The left ventricle has normal function. The left ventricle has no regional wall motion abnormalities. Left ventricular diastolic parameters are indeterminate.  2. Right ventricular systolic function is normal. The right ventricular size is normal. Tricuspid regurgitation signal is inadequate for assessing PA pressure.  3. Left atrial size was moderately dilated.  4. The mitral valve is grossly normal. Trivial mitral valve regurgitation. No evidence of mitral stenosis.  5. The aortic valve was not well visualized. There is mild calcification of the aortic valve. There is mild thickening of the aortic valve. Aortic valve regurgitation is mild to moderate. Aortic valve sclerosis/calcification is present, without any evidence of aortic stenosis.  6. Aortic dilatation noted. There is borderline dilatation of the aortic root, measuring 40 mm. FINDINGS  Left Ventricle: Left ventricular ejection fraction, by estimation, is 55 to 60%. The left ventricle has normal function. The left ventricle has no regional wall motion abnormalities. The left ventricular internal cavity size was normal in size. There is  borderline left ventricular hypertrophy. Left ventricular diastolic parameters are indeterminate. Right Ventricle: The right ventricular size is normal. Right vetricular wall thickness was not well visualized. Right ventricular systolic function is normal. Tricuspid regurgitation signal is inadequate for assessing PA pressure. Left Atrium: Left atrial size was  moderately dilated. Right Atrium: Right atrial size was normal in size. Pericardium: The pericardium was not well visualized. Mitral Valve: The mitral valve is grossly normal. Trivial mitral valve regurgitation. No evidence of mitral valve stenosis. MV peak gradient, 2.9 mmHg. The mean mitral valve gradient is 2.0 mmHg. Tricuspid Valve: The tricuspid valve is not well visualized. Tricuspid valve regurgitation is trivial. Aortic Valve: The aortic valve was not well visualized. There is mild calcification of the aortic valve. There is mild thickening of the aortic valve. Aortic  Physician Discharge Summary   Aaron Gibbs  male DOB: 1935-02-28  QIO:[id number]  PCP: Smitty Cords, DO  Admit date: 03/13/2023 Discharge date: 03/15/2023  Admitted From: home Disposition:  home Daughter updated on the phone prior to discharge.  CODE STATUS: DNR  Discharge Instructions     Discharge instructions   Complete by: As directed    Please finish 4 more days of oral antibiotic Keflex to protect your skin wound.  Coreg and Benicar are discontinued because your blood pressure was low normal without them.  Ativan can cause more confusion.  I have ordered Seroquel to try for agitation. - -   Discharge wound care:   Complete by: As directed    Cleanse wound to left forearm with water and pat dry. Apply mupirocin to the wound bed, then telfa or other contact layer on top.  Cover with dry gauze and kerlix  May secure with ace bandage.  Change daily. [facility] Course:  For full details, please see H&P, progress notes, consult notes and ancillary notes.  Briefly,  Aaron Gibbs is a 88 y.o. 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   Patient only complained about feeling weak and uncontrolled chronic pain " everywhere" including shoulder, back and legs.  Caregiver called EMS for patient having multiple episodes of visual hallucinations.  Recently patient was diagnosed with recurrent large right-sided pleural fusion which was drained and Pleurx catheter was placed.   On arrival in the ED it was found patient was hypoxic with O2 saturation 70s patient was placed on 10 L of oxygen.    Acute metabolic encephalopathy Hospital delirium -initially thought  to be due to sepsis, also could be delirium. -daughter also expressed concern about polypharmacy as patient was recently started on Ativan for anxiety, which can worsen risk of delirium. --pt is blind, with pain and respiratory distress, at very high risk for delirium.  Pt was found to be agitated and trying to get out of bed, though was alert and oriented.  Received strong sedating meds with no lasting calming effects --cont home oral Zyprexa --Ativan d/c'ed on discharge, and Seroquel prescribed instead for PRN agitation. --cont pain management.  Morphine ordered at discharge, and daughter said hospice provider will continue pain management.   Acute on chronic hypoxic respiratory failure secondary to recurrent right-sided pleural effusion On 3-4L O2 at baseline -limited amount of sample on last thoracentesis on 03/06/23 and pleural effusion samples showed atypical cells.  Chemistry study on the pleural fluid showed exudate.   -~1L pleural fluid drained from pleureX can and sent for cytology on 9/24, confirmed with lab. --cont PleureX cath draining at home  --Pt was on 3-4L O2 prior to discharge.   Left arm melanoma status post excision -Family reported that the patient declined immunotherapy --wound care RN consulted, wound care per order --empiric started on cefepime for left arm cellulitis, however, unclear if true cellulitis.  Pt was discharged on 4 more days of Keflex to finish the abx course.      Chronic pain --pain is pt's main complaint.   --cont buprenorphine patch --discharged on morphine PRN for pain and dyspnea.  Hospice provider to continue management of pain.  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 Left vertebral artery remains patent. 2. Chronic C2 fracture. 3. Partially imaged right pleural effusion with overlying opacities, better characterized on recent CT chest. 4. Limited study due to patient positioning. Electronically Signed   By: Feliberto Harts M.D.   On: 02/19/2023 13:12   ECHOCARDIOGRAM COMPLETE  Result Date: 02/19/2023    ECHOCARDIOGRAM REPORT   Patient Name:   Aaron Gibbs Date of Exam: 02/18/2023 Medical Rec #:  [id number]        Height:       71.0 in Accession #:    [id number]       Weight:       213.8 lb Date of Birth:  [birth year]        BSA:          2.169 m Patient Age:    87 years         BP:           141/87 mmHg Patient  Gender: M                HR:           68 bpm. Exam Location:  [facility] Procedure: 2D Echo, Cardiac Doppler and Color Doppler Indications:     Syncope R55  History:         Patient has no prior history of Echocardiogram examinations.                  CHF; Risk Factors:Hypertension.  Sonographer:     Neysa Bonito Roar Referring Phys:  [id number] Andris Baumann Diagnosing Phys: Yvonne Kendall MD IMPRESSIONS  1. Left ventricular ejection fraction, by estimation, is 55 to 60%. The left ventricle has normal function. The left ventricle has no regional wall motion abnormalities. Left ventricular diastolic parameters are indeterminate.  2. Right ventricular systolic function is normal. The right ventricular size is normal. Tricuspid regurgitation signal is inadequate for assessing PA pressure.  3. Left atrial size was moderately dilated.  4. The mitral valve is grossly normal. Trivial mitral valve regurgitation. No evidence of mitral stenosis.  5. The aortic valve was not well visualized. There is mild calcification of the aortic valve. There is mild thickening of the aortic valve. Aortic valve regurgitation is mild to moderate. Aortic valve sclerosis/calcification is present, without any evidence of aortic stenosis.  6. Aortic dilatation noted. There is borderline dilatation of the aortic root, measuring 40 mm. FINDINGS  Left Ventricle: Left ventricular ejection fraction, by estimation, is 55 to 60%. The left ventricle has normal function. The left ventricle has no regional wall motion abnormalities. The left ventricular internal cavity size was normal in size. There is  borderline left ventricular hypertrophy. Left ventricular diastolic parameters are indeterminate. Right Ventricle: The right ventricular size is normal. Right vetricular wall thickness was not well visualized. Right ventricular systolic function is normal. Tricuspid regurgitation signal is inadequate for assessing PA pressure. Left Atrium: Left atrial size was  moderately dilated. Right Atrium: Right atrial size was normal in size. Pericardium: The pericardium was not well visualized. Mitral Valve: The mitral valve is grossly normal. Trivial mitral valve regurgitation. No evidence of mitral valve stenosis. MV peak gradient, 2.9 mmHg. The mean mitral valve gradient is 2.0 mmHg. Tricuspid Valve: The tricuspid valve is not well visualized. Tricuspid valve regurgitation is trivial. Aortic Valve: The aortic valve was not well visualized. There is mild calcification of the aortic valve. There is mild thickening of the aortic valve. Aortic  Physician Discharge Summary   Aaron Gibbs  male DOB: 1935-02-28  QIO:[id number]  PCP: Smitty Cords, DO  Admit date: 03/13/2023 Discharge date: 03/15/2023  Admitted From: home Disposition:  home Daughter updated on the phone prior to discharge.  CODE STATUS: DNR  Discharge Instructions     Discharge instructions   Complete by: As directed    Please finish 4 more days of oral antibiotic Keflex to protect your skin wound.  Coreg and Benicar are discontinued because your blood pressure was low normal without them.  Ativan can cause more confusion.  I have ordered Seroquel to try for agitation. - -   Discharge wound care:   Complete by: As directed    Cleanse wound to left forearm with water and pat dry. Apply mupirocin to the wound bed, then telfa or other contact layer on top.  Cover with dry gauze and kerlix  May secure with ace bandage.  Change daily. [facility] Course:  For full details, please see H&P, progress notes, consult notes and ancillary notes.  Briefly,  Aaron Gibbs is a 88 y.o. 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   Patient only complained about feeling weak and uncontrolled chronic pain " everywhere" including shoulder, back and legs.  Caregiver called EMS for patient having multiple episodes of visual hallucinations.  Recently patient was diagnosed with recurrent large right-sided pleural fusion which was drained and Pleurx catheter was placed.   On arrival in the ED it was found patient was hypoxic with O2 saturation 70s patient was placed on 10 L of oxygen.    Acute metabolic encephalopathy Hospital delirium -initially thought  to be due to sepsis, also could be delirium. -daughter also expressed concern about polypharmacy as patient was recently started on Ativan for anxiety, which can worsen risk of delirium. --pt is blind, with pain and respiratory distress, at very high risk for delirium.  Pt was found to be agitated and trying to get out of bed, though was alert and oriented.  Received strong sedating meds with no lasting calming effects --cont home oral Zyprexa --Ativan d/c'ed on discharge, and Seroquel prescribed instead for PRN agitation. --cont pain management.  Morphine ordered at discharge, and daughter said hospice provider will continue pain management.   Acute on chronic hypoxic respiratory failure secondary to recurrent right-sided pleural effusion On 3-4L O2 at baseline -limited amount of sample on last thoracentesis on 03/06/23 and pleural effusion samples showed atypical cells.  Chemistry study on the pleural fluid showed exudate.   -~1L pleural fluid drained from pleureX can and sent for cytology on 9/24, confirmed with lab. --cont PleureX cath draining at home  --Pt was on 3-4L O2 prior to discharge.   Left arm melanoma status post excision -Family reported that the patient declined immunotherapy --wound care RN consulted, wound care per order --empiric started on cefepime for left arm cellulitis, however, unclear if true cellulitis.  Pt was discharged on 4 more days of Keflex to finish the abx course.      Chronic pain --pain is pt's main complaint.   --cont buprenorphine patch --discharged on morphine PRN for pain and dyspnea.  Hospice provider to continue management of pain.  Sepsis, ruled out --does not meet  Physician Discharge Summary   Aaron Gibbs  male DOB: 1935-02-28  QIO:[id number]  PCP: Smitty Cords, DO  Admit date: 03/13/2023 Discharge date: 03/15/2023  Admitted From: home Disposition:  home Daughter updated on the phone prior to discharge.  CODE STATUS: DNR  Discharge Instructions     Discharge instructions   Complete by: As directed    Please finish 4 more days of oral antibiotic Keflex to protect your skin wound.  Coreg and Benicar are discontinued because your blood pressure was low normal without them.  Ativan can cause more confusion.  I have ordered Seroquel to try for agitation. - -   Discharge wound care:   Complete by: As directed    Cleanse wound to left forearm with water and pat dry. Apply mupirocin to the wound bed, then telfa or other contact layer on top.  Cover with dry gauze and kerlix  May secure with ace bandage.  Change daily. [facility] Course:  For full details, please see H&P, progress notes, consult notes and ancillary notes.  Briefly,  Aaron Gibbs is a 88 y.o. 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   Patient only complained about feeling weak and uncontrolled chronic pain " everywhere" including shoulder, back and legs.  Caregiver called EMS for patient having multiple episodes of visual hallucinations.  Recently patient was diagnosed with recurrent large right-sided pleural fusion which was drained and Pleurx catheter was placed.   On arrival in the ED it was found patient was hypoxic with O2 saturation 70s patient was placed on 10 L of oxygen.    Acute metabolic encephalopathy Hospital delirium -initially thought  to be due to sepsis, also could be delirium. -daughter also expressed concern about polypharmacy as patient was recently started on Ativan for anxiety, which can worsen risk of delirium. --pt is blind, with pain and respiratory distress, at very high risk for delirium.  Pt was found to be agitated and trying to get out of bed, though was alert and oriented.  Received strong sedating meds with no lasting calming effects --cont home oral Zyprexa --Ativan d/c'ed on discharge, and Seroquel prescribed instead for PRN agitation. --cont pain management.  Morphine ordered at discharge, and daughter said hospice provider will continue pain management.   Acute on chronic hypoxic respiratory failure secondary to recurrent right-sided pleural effusion On 3-4L O2 at baseline -limited amount of sample on last thoracentesis on 03/06/23 and pleural effusion samples showed atypical cells.  Chemistry study on the pleural fluid showed exudate.   -~1L pleural fluid drained from pleureX can and sent for cytology on 9/24, confirmed with lab. --cont PleureX cath draining at home  --Pt was on 3-4L O2 prior to discharge.   Left arm melanoma status post excision -Family reported that the patient declined immunotherapy --wound care RN consulted, wound care per order --empiric started on cefepime for left arm cellulitis, however, unclear if true cellulitis.  Pt was discharged on 4 more days of Keflex to finish the abx course.      Chronic pain --pain is pt's main complaint.   --cont buprenorphine patch --discharged on morphine PRN for pain and dyspnea.  Hospice provider to continue management of pain.  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 Left vertebral artery remains patent. 2. Chronic C2 fracture. 3. Partially imaged right pleural effusion with overlying opacities, better characterized on recent CT chest. 4. Limited study due to patient positioning. Electronically Signed   By: Feliberto Harts M.D.   On: 02/19/2023 13:12   ECHOCARDIOGRAM COMPLETE  Result Date: 02/19/2023    ECHOCARDIOGRAM REPORT   Patient Name:   Aaron Gibbs Date of Exam: 02/18/2023 Medical Rec #:  [id number]        Height:       71.0 in Accession #:    [id number]       Weight:       213.8 lb Date of Birth:  [birth year]        BSA:          2.169 m Patient Age:    87 years         BP:           141/87 mmHg Patient  Gender: M                HR:           68 bpm. Exam Location:  [facility] Procedure: 2D Echo, Cardiac Doppler and Color Doppler Indications:     Syncope R55  History:         Patient has no prior history of Echocardiogram examinations.                  CHF; Risk Factors:Hypertension.  Sonographer:     Neysa Bonito Roar Referring Phys:  [id number] Andris Baumann Diagnosing Phys: Yvonne Kendall MD IMPRESSIONS  1. Left ventricular ejection fraction, by estimation, is 55 to 60%. The left ventricle has normal function. The left ventricle has no regional wall motion abnormalities. Left ventricular diastolic parameters are indeterminate.  2. Right ventricular systolic function is normal. The right ventricular size is normal. Tricuspid regurgitation signal is inadequate for assessing PA pressure.  3. Left atrial size was moderately dilated.  4. The mitral valve is grossly normal. Trivial mitral valve regurgitation. No evidence of mitral stenosis.  5. The aortic valve was not well visualized. There is mild calcification of the aortic valve. There is mild thickening of the aortic valve. Aortic valve regurgitation is mild to moderate. Aortic valve sclerosis/calcification is present, without any evidence of aortic stenosis.  6. Aortic dilatation noted. There is borderline dilatation of the aortic root, measuring 40 mm. FINDINGS  Left Ventricle: Left ventricular ejection fraction, by estimation, is 55 to 60%. The left ventricle has normal function. The left ventricle has no regional wall motion abnormalities. The left ventricular internal cavity size was normal in size. There is  borderline left ventricular hypertrophy. Left ventricular diastolic parameters are indeterminate. Right Ventricle: The right ventricular size is normal. Right vetricular wall thickness was not well visualized. Right ventricular systolic function is normal. Tricuspid regurgitation signal is inadequate for assessing PA pressure. Left Atrium: Left atrial size was  moderately dilated. Right Atrium: Right atrial size was normal in size. Pericardium: The pericardium was not well visualized. Mitral Valve: The mitral valve is grossly normal. Trivial mitral valve regurgitation. No evidence of mitral valve stenosis. MV peak gradient, 2.9 mmHg. The mean mitral valve gradient is 2.0 mmHg. Tricuspid Valve: The tricuspid valve is not well visualized. Tricuspid valve regurgitation is trivial. Aortic Valve: The aortic valve was not well visualized. There is mild calcification of the aortic valve. There is mild thickening of the aortic valve. 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 Left vertebral artery remains patent. 2. Chronic C2 fracture. 3. Partially imaged right pleural effusion with overlying opacities, better characterized on recent CT chest. 4. Limited study due to patient positioning. Electronically Signed   By: Feliberto Harts M.D.   On: 02/19/2023 13:12   ECHOCARDIOGRAM COMPLETE  Result Date: 02/19/2023    ECHOCARDIOGRAM REPORT   Patient Name:   Aaron Gibbs Date of Exam: 02/18/2023 Medical Rec #:  [id number]        Height:       71.0 in Accession #:    [id number]       Weight:       213.8 lb Date of Birth:  [birth year]        BSA:          2.169 m Patient Age:    87 years         BP:           141/87 mmHg Patient  Gender: M                HR:           68 bpm. Exam Location:  [facility] Procedure: 2D Echo, Cardiac Doppler and Color Doppler Indications:     Syncope R55  History:         Patient has no prior history of Echocardiogram examinations.                  CHF; Risk Factors:Hypertension.  Sonographer:     Neysa Bonito Roar Referring Phys:  [id number] Andris Baumann Diagnosing Phys: Yvonne Kendall MD IMPRESSIONS  1. Left ventricular ejection fraction, by estimation, is 55 to 60%. The left ventricle has normal function. The left ventricle has no regional wall motion abnormalities. Left ventricular diastolic parameters are indeterminate.  2. Right ventricular systolic function is normal. The right ventricular size is normal. Tricuspid regurgitation signal is inadequate for assessing PA pressure.  3. Left atrial size was moderately dilated.  4. The mitral valve is grossly normal. Trivial mitral valve regurgitation. No evidence of mitral stenosis.  5. The aortic valve was not well visualized. There is mild calcification of the aortic valve. There is mild thickening of the aortic valve. Aortic valve regurgitation is mild to moderate. Aortic valve sclerosis/calcification is present, without any evidence of aortic stenosis.  6. Aortic dilatation noted. There is borderline dilatation of the aortic root, measuring 40 mm. FINDINGS  Left Ventricle: Left ventricular ejection fraction, by estimation, is 55 to 60%. The left ventricle has normal function. The left ventricle has no regional wall motion abnormalities. The left ventricular internal cavity size was normal in size. There is  borderline left ventricular hypertrophy. Left ventricular diastolic parameters are indeterminate. Right Ventricle: The right ventricular size is normal. Right vetricular wall thickness was not well visualized. Right ventricular systolic function is normal. Tricuspid regurgitation signal is inadequate for assessing PA pressure. Left Atrium: Left atrial size was  moderately dilated. Right Atrium: Right atrial size was normal in size. Pericardium: The pericardium was not well visualized. Mitral Valve: The mitral valve is grossly normal. Trivial mitral valve regurgitation. No evidence of mitral valve stenosis. MV peak gradient, 2.9 mmHg. The mean mitral valve gradient is 2.0 mmHg. Tricuspid Valve: The tricuspid valve is not well visualized. Tricuspid valve regurgitation is trivial. Aortic Valve: The aortic valve was not well visualized. There is mild calcification of the aortic valve. There is mild thickening of the aortic valve. Aortic  Physician Discharge Summary   Aaron Gibbs  male DOB: 1935-02-28  QIO:[id number]  PCP: Smitty Cords, DO  Admit date: 03/13/2023 Discharge date: 03/15/2023  Admitted From: home Disposition:  home Daughter updated on the phone prior to discharge.  CODE STATUS: DNR  Discharge Instructions     Discharge instructions   Complete by: As directed    Please finish 4 more days of oral antibiotic Keflex to protect your skin wound.  Coreg and Benicar are discontinued because your blood pressure was low normal without them.  Ativan can cause more confusion.  I have ordered Seroquel to try for agitation. - -   Discharge wound care:   Complete by: As directed    Cleanse wound to left forearm with water and pat dry. Apply mupirocin to the wound bed, then telfa or other contact layer on top.  Cover with dry gauze and kerlix  May secure with ace bandage.  Change daily. [facility] Course:  For full details, please see H&P, progress notes, consult notes and ancillary notes.  Briefly,  Aaron Gibbs is a 88 y.o. 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   Patient only complained about feeling weak and uncontrolled chronic pain " everywhere" including shoulder, back and legs.  Caregiver called EMS for patient having multiple episodes of visual hallucinations.  Recently patient was diagnosed with recurrent large right-sided pleural fusion which was drained and Pleurx catheter was placed.   On arrival in the ED it was found patient was hypoxic with O2 saturation 70s patient was placed on 10 L of oxygen.    Acute metabolic encephalopathy Hospital delirium -initially thought  to be due to sepsis, also could be delirium. -daughter also expressed concern about polypharmacy as patient was recently started on Ativan for anxiety, which can worsen risk of delirium. --pt is blind, with pain and respiratory distress, at very high risk for delirium.  Pt was found to be agitated and trying to get out of bed, though was alert and oriented.  Received strong sedating meds with no lasting calming effects --cont home oral Zyprexa --Ativan d/c'ed on discharge, and Seroquel prescribed instead for PRN agitation. --cont pain management.  Morphine ordered at discharge, and daughter said hospice provider will continue pain management.   Acute on chronic hypoxic respiratory failure secondary to recurrent right-sided pleural effusion On 3-4L O2 at baseline -limited amount of sample on last thoracentesis on 03/06/23 and pleural effusion samples showed atypical cells.  Chemistry study on the pleural fluid showed exudate.   -~1L pleural fluid drained from pleureX can and sent for cytology on 9/24, confirmed with lab. --cont PleureX cath draining at home  --Pt was on 3-4L O2 prior to discharge.   Left arm melanoma status post excision -Family reported that the patient declined immunotherapy --wound care RN consulted, wound care per order --empiric started on cefepime for left arm cellulitis, however, unclear if true cellulitis.  Pt was discharged on 4 more days of Keflex to finish the abx course.      Chronic pain --pain is pt's main complaint.   --cont buprenorphine patch --discharged on morphine PRN for pain and dyspnea.  Hospice provider to continue management of pain.  Sepsis, ruled out --does not meet  Physician Discharge Summary   Aaron Gibbs  male DOB: 1935-02-28  QIO:[id number]  PCP: Smitty Cords, DO  Admit date: 03/13/2023 Discharge date: 03/15/2023  Admitted From: home Disposition:  home Daughter updated on the phone prior to discharge.  CODE STATUS: DNR  Discharge Instructions     Discharge instructions   Complete by: As directed    Please finish 4 more days of oral antibiotic Keflex to protect your skin wound.  Coreg and Benicar are discontinued because your blood pressure was low normal without them.  Ativan can cause more confusion.  I have ordered Seroquel to try for agitation. - -   Discharge wound care:   Complete by: As directed    Cleanse wound to left forearm with water and pat dry. Apply mupirocin to the wound bed, then telfa or other contact layer on top.  Cover with dry gauze and kerlix  May secure with ace bandage.  Change daily. [facility] Course:  For full details, please see H&P, progress notes, consult notes and ancillary notes.  Briefly,  Aaron Gibbs is a 88 y.o. 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   Patient only complained about feeling weak and uncontrolled chronic pain " everywhere" including shoulder, back and legs.  Caregiver called EMS for patient having multiple episodes of visual hallucinations.  Recently patient was diagnosed with recurrent large right-sided pleural fusion which was drained and Pleurx catheter was placed.   On arrival in the ED it was found patient was hypoxic with O2 saturation 70s patient was placed on 10 L of oxygen.    Acute metabolic encephalopathy Hospital delirium -initially thought  to be due to sepsis, also could be delirium. -daughter also expressed concern about polypharmacy as patient was recently started on Ativan for anxiety, which can worsen risk of delirium. --pt is blind, with pain and respiratory distress, at very high risk for delirium.  Pt was found to be agitated and trying to get out of bed, though was alert and oriented.  Received strong sedating meds with no lasting calming effects --cont home oral Zyprexa --Ativan d/c'ed on discharge, and Seroquel prescribed instead for PRN agitation. --cont pain management.  Morphine ordered at discharge, and daughter said hospice provider will continue pain management.   Acute on chronic hypoxic respiratory failure secondary to recurrent right-sided pleural effusion On 3-4L O2 at baseline -limited amount of sample on last thoracentesis on 03/06/23 and pleural effusion samples showed atypical cells.  Chemistry study on the pleural fluid showed exudate.   -~1L pleural fluid drained from pleureX can and sent for cytology on 9/24, confirmed with lab. --cont PleureX cath draining at home  --Pt was on 3-4L O2 prior to discharge.   Left arm melanoma status post excision -Family reported that the patient declined immunotherapy --wound care RN consulted, wound care per order --empiric started on cefepime for left arm cellulitis, however, unclear if true cellulitis.  Pt was discharged on 4 more days of Keflex to finish the abx course.      Chronic pain --pain is pt's main complaint.   --cont buprenorphine patch --discharged on morphine PRN for pain and dyspnea.  Hospice provider to continue management of pain.  Sepsis, ruled out --does not meet  Physician Discharge Summary   Aaron Gibbs  male DOB: 1935-02-28  QIO:[id number]  PCP: Smitty Cords, DO  Admit date: 03/13/2023 Discharge date: 03/15/2023  Admitted From: home Disposition:  home Daughter updated on the phone prior to discharge.  CODE STATUS: DNR  Discharge Instructions     Discharge instructions   Complete by: As directed    Please finish 4 more days of oral antibiotic Keflex to protect your skin wound.  Coreg and Benicar are discontinued because your blood pressure was low normal without them.  Ativan can cause more confusion.  I have ordered Seroquel to try for agitation. - -   Discharge wound care:   Complete by: As directed    Cleanse wound to left forearm with water and pat dry. Apply mupirocin to the wound bed, then telfa or other contact layer on top.  Cover with dry gauze and kerlix  May secure with ace bandage.  Change daily. [facility] Course:  For full details, please see H&P, progress notes, consult notes and ancillary notes.  Briefly,  Aaron Gibbs is a 88 y.o. 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   Patient only complained about feeling weak and uncontrolled chronic pain " everywhere" including shoulder, back and legs.  Caregiver called EMS for patient having multiple episodes of visual hallucinations.  Recently patient was diagnosed with recurrent large right-sided pleural fusion which was drained and Pleurx catheter was placed.   On arrival in the ED it was found patient was hypoxic with O2 saturation 70s patient was placed on 10 L of oxygen.    Acute metabolic encephalopathy Hospital delirium -initially thought  to be due to sepsis, also could be delirium. -daughter also expressed concern about polypharmacy as patient was recently started on Ativan for anxiety, which can worsen risk of delirium. --pt is blind, with pain and respiratory distress, at very high risk for delirium.  Pt was found to be agitated and trying to get out of bed, though was alert and oriented.  Received strong sedating meds with no lasting calming effects --cont home oral Zyprexa --Ativan d/c'ed on discharge, and Seroquel prescribed instead for PRN agitation. --cont pain management.  Morphine ordered at discharge, and daughter said hospice provider will continue pain management.   Acute on chronic hypoxic respiratory failure secondary to recurrent right-sided pleural effusion On 3-4L O2 at baseline -limited amount of sample on last thoracentesis on 03/06/23 and pleural effusion samples showed atypical cells.  Chemistry study on the pleural fluid showed exudate.   -~1L pleural fluid drained from pleureX can and sent for cytology on 9/24, confirmed with lab. --cont PleureX cath draining at home  --Pt was on 3-4L O2 prior to discharge.   Left arm melanoma status post excision -Family reported that the patient declined immunotherapy --wound care RN consulted, wound care per order --empiric started on cefepime for left arm cellulitis, however, unclear if true cellulitis.  Pt was discharged on 4 more days of Keflex to finish the abx course.      Chronic pain --pain is pt's main complaint.   --cont buprenorphine patch --discharged on morphine PRN for pain and dyspnea.  Hospice provider to continue management of pain.  Sepsis, ruled out --does not meet  Physician Discharge Summary   Aaron Gibbs  male DOB: 1935-02-28  QIO:[id number]  PCP: Smitty Cords, DO  Admit date: 03/13/2023 Discharge date: 03/15/2023  Admitted From: home Disposition:  home Daughter updated on the phone prior to discharge.  CODE STATUS: DNR  Discharge Instructions     Discharge instructions   Complete by: As directed    Please finish 4 more days of oral antibiotic Keflex to protect your skin wound.  Coreg and Benicar are discontinued because your blood pressure was low normal without them.  Ativan can cause more confusion.  I have ordered Seroquel to try for agitation. - -   Discharge wound care:   Complete by: As directed    Cleanse wound to left forearm with water and pat dry. Apply mupirocin to the wound bed, then telfa or other contact layer on top.  Cover with dry gauze and kerlix  May secure with ace bandage.  Change daily. [facility] Course:  For full details, please see H&P, progress notes, consult notes and ancillary notes.  Briefly,  Aaron Gibbs is a 88 y.o. 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   Patient only complained about feeling weak and uncontrolled chronic pain " everywhere" including shoulder, back and legs.  Caregiver called EMS for patient having multiple episodes of visual hallucinations.  Recently patient was diagnosed with recurrent large right-sided pleural fusion which was drained and Pleurx catheter was placed.   On arrival in the ED it was found patient was hypoxic with O2 saturation 70s patient was placed on 10 L of oxygen.    Acute metabolic encephalopathy Hospital delirium -initially thought  to be due to sepsis, also could be delirium. -daughter also expressed concern about polypharmacy as patient was recently started on Ativan for anxiety, which can worsen risk of delirium. --pt is blind, with pain and respiratory distress, at very high risk for delirium.  Pt was found to be agitated and trying to get out of bed, though was alert and oriented.  Received strong sedating meds with no lasting calming effects --cont home oral Zyprexa --Ativan d/c'ed on discharge, and Seroquel prescribed instead for PRN agitation. --cont pain management.  Morphine ordered at discharge, and daughter said hospice provider will continue pain management.   Acute on chronic hypoxic respiratory failure secondary to recurrent right-sided pleural effusion On 3-4L O2 at baseline -limited amount of sample on last thoracentesis on 03/06/23 and pleural effusion samples showed atypical cells.  Chemistry study on the pleural fluid showed exudate.   -~1L pleural fluid drained from pleureX can and sent for cytology on 9/24, confirmed with lab. --cont PleureX cath draining at home  --Pt was on 3-4L O2 prior to discharge.   Left arm melanoma status post excision -Family reported that the patient declined immunotherapy --wound care RN consulted, wound care per order --empiric started on cefepime for left arm cellulitis, however, unclear if true cellulitis.  Pt was discharged on 4 more days of Keflex to finish the abx course.      Chronic pain --pain is pt's main complaint.   --cont buprenorphine patch --discharged on morphine PRN for pain and dyspnea.  Hospice provider to continue management of pain.  Sepsis, ruled out --does not meet  Physician Discharge Summary   Aaron Gibbs  male DOB: 1935-02-28  QIO:[id number]  PCP: Smitty Cords, DO  Admit date: 03/13/2023 Discharge date: 03/15/2023  Admitted From: home Disposition:  home Daughter updated on the phone prior to discharge.  CODE STATUS: DNR  Discharge Instructions     Discharge instructions   Complete by: As directed    Please finish 4 more days of oral antibiotic Keflex to protect your skin wound.  Coreg and Benicar are discontinued because your blood pressure was low normal without them.  Ativan can cause more confusion.  I have ordered Seroquel to try for agitation. - -   Discharge wound care:   Complete by: As directed    Cleanse wound to left forearm with water and pat dry. Apply mupirocin to the wound bed, then telfa or other contact layer on top.  Cover with dry gauze and kerlix  May secure with ace bandage.  Change daily. [facility] Course:  For full details, please see H&P, progress notes, consult notes and ancillary notes.  Briefly,  Aaron Gibbs is a 88 y.o. 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   Patient only complained about feeling weak and uncontrolled chronic pain " everywhere" including shoulder, back and legs.  Caregiver called EMS for patient having multiple episodes of visual hallucinations.  Recently patient was diagnosed with recurrent large right-sided pleural fusion which was drained and Pleurx catheter was placed.   On arrival in the ED it was found patient was hypoxic with O2 saturation 70s patient was placed on 10 L of oxygen.    Acute metabolic encephalopathy Hospital delirium -initially thought  to be due to sepsis, also could be delirium. -daughter also expressed concern about polypharmacy as patient was recently started on Ativan for anxiety, which can worsen risk of delirium. --pt is blind, with pain and respiratory distress, at very high risk for delirium.  Pt was found to be agitated and trying to get out of bed, though was alert and oriented.  Received strong sedating meds with no lasting calming effects --cont home oral Zyprexa --Ativan d/c'ed on discharge, and Seroquel prescribed instead for PRN agitation. --cont pain management.  Morphine ordered at discharge, and daughter said hospice provider will continue pain management.   Acute on chronic hypoxic respiratory failure secondary to recurrent right-sided pleural effusion On 3-4L O2 at baseline -limited amount of sample on last thoracentesis on 03/06/23 and pleural effusion samples showed atypical cells.  Chemistry study on the pleural fluid showed exudate.   -~1L pleural fluid drained from pleureX can and sent for cytology on 9/24, confirmed with lab. --cont PleureX cath draining at home  --Pt was on 3-4L O2 prior to discharge.   Left arm melanoma status post excision -Family reported that the patient declined immunotherapy --wound care RN consulted, wound care per order --empiric started on cefepime for left arm cellulitis, however, unclear if true cellulitis.  Pt was discharged on 4 more days of Keflex to finish the abx course.      Chronic pain --pain is pt's main complaint.   --cont buprenorphine patch --discharged on morphine PRN for pain and dyspnea.  Hospice provider to continue management of pain.  Sepsis, ruled out --does not meet

## 2023-03-15 NOTE — Consult Note (Signed)
WOC Nurse Consult Note: Reason for Consult:Sorbact has fallen off left forearm excision site.  No grafting material noted from 02/14/23 application with wound care .  Will implement topical orders for moist wound healing.  Wound type: Excision malignancy Pressure Injury POA: NA Measurement: 13.8 cm x 11 cm x 0.5 cm  Wound MWU:[id number] white patches (likely infiltration of grafting material) Drainage (amount, consistency, odor) minimal serosanguinous  no odor Periwound: intact   Dressing procedure/placement/frequency: Cleanse wound to left forearm with NS and pat dry. Apply Mepilex or other contact layer to wound bed for atraumatic dressing removal.  Top with mupirocin ointment.  Cover with dry gauze and kerlix  May secure with ace bandage.  Change daily.  Will not follow at this time.  Please re-consult if needed.  Mike Gip MSN, RN, FNP-BC CWON Wound, Ostomy, Continence Nurse Outpatient [facility] [phone] Pager [phone]

## 2023-03-15 NOTE — Evaluation (Signed)
Physical Therapy Evaluation Patient Details Name: Aaron Gibbs MRN: [id number] DOB: 1934/09/17 Today's Date: 03/15/2023  History of Present Illness  88 y.o. 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 Suspected dx of sepsis, AMS.  Clinical Impression   Pt is seen by OT and PT for co-treatment secondary to medical complexity of patient. Pt is received in bed with home aide at bedside, he is agitated but agreeable to session. At baseline, Pt's home aide reports Pt does not amb, assist with transfers, or stand. Pt performs bed mobility and transfers min-mod A x2 for safety secondary to increased fatigue, generalized discomfort, and weakness. During session, Pt able to progress STS assist from mod A x2 to min A x2 with 2 hand held assist although unable to tolerate standing activity for longer than 30 sec due to increased fatigue. At end of session, Pt is supine in bed but reports discomfort with laying down due to chronic neck pain-per home aide, Pt "always does this". Pt would benefit from skilled PT to address above deficits and promote optimal return to PLOF.      If plan is discharge home, recommend the following: A little help with walking and/or transfers;A little help with bathing/dressing/bathroom;Direct supervision/assist for medications management;Assist for transportation;Assistance with cooking/housework   Can travel by private vehicle        Equipment Recommendations Other (comment) (TBD at this time)  Recommendations for Other Services       Functional Status Assessment Patient has had a recent decline in their functional status and  demonstrates the ability to make significant improvements in function in a reasonable and predictable amount of time.     Precautions / Restrictions Precautions Precautions: Fall Precaution Comments: blind Restrictions Weight Bearing Restrictions: No      Mobility  Bed Mobility Overal bed mobility: Needs Assistance Bed Mobility: Supine to Sit, Sit to Supine     Supine to sit: HOB elevated, Min assist, Mod assist, +2 for physical assistance Sit to supine: Min assist, Mod assist, +2 for physical assistance   General bed mobility comments: able to perform scoot to EOB CGA for safety; frequent cuing and assist with hand placement for bed mobility; required BLE management to intitiate bed mobility    Transfers Overall transfer level: Needs assistance Equipment used: 2 person hand held assist Transfers: Sit to/from Stand, Bed to chair/wheelchair/BSC Sit to Stand: Min assist, Mod assist, +2 physical assistance          Lateral/Scoot Transfers: Contact guard assist, +2 physical assistance General transfer comment: First attempt of STS required mod A x2 but able to progress to min A x2 during second STS attempt; able to laterally scoot to EOB CGA x2 for safety    Ambulation/Gait               General Gait Details: Deferred at this time secondary to limited cognition and increased pain  Stairs            Wheelchair Mobility     Tilt Bed    Modified Rankin (Stroke Patients Only)       Balance Overall balance assessment: Needs assistance Sitting-balance support: Feet supported, Single extremity supported Sitting balance-Leahy Scale: Fair Sitting balance - Comments: able to maintain seated  EOB balance during functional activities but requires CGA due to instances of falling asleep and posterior lean Postural control: Posterior lean Standing balance support: Bilateral upper extremity supported, During functional activity, Reliant on assistive device for  balance Standing balance-Leahy Scale: Poor Standing balance comment: required 2 HHA to maintain static standing balance; unable to tolerate standing for more than 30 sec secondary to increased BLE weakness                             Pertinent Vitals/Pain Pain Assessment Pain Assessment: Faces Faces Pain Scale: Hurts whole lot Pain Location: neck Pain Descriptors / Indicators: Grimacing, Discomfort Pain Intervention(s): Limited activity within patient's tolerance, Monitored during session, Repositioned    Home Living Family/patient expects to be discharged to:: Private residence Living Arrangements: Alone Available Help at Discharge: Personal care attendant;Available 24 hours/day Type of Home: House Home Access: Stairs to enter Entrance Stairs-Rails: Right;Left;Can reach both Entrance Stairs-Number of Steps: 2   Home Layout: One level Home Equipment: BSC/3in1;Wheelchair - manual;Standard Environmental consultant Additional Comments: caregiver verified information    Prior Function Prior Level of Function : Needs assist       Physical Assist : Mobility (physical) Mobility (physical): Transfers;Gait;Stairs;Bed mobility   Mobility Comments: caregiver assists with w/c transfer to toilet; ambulatory prior to recent admission but minimal walking in last 2 weeks ADLs Comments: caregiver assists with all aspects due to low vision     Extremity/Trunk Assessment   Upper Extremity Assessment Upper Extremity Assessment: Generalized weakness    Lower Extremity Assessment Lower Extremity Assessment: Generalized weakness       Communication   Communication Communication: No apparent difficulties Cueing Techniques: Verbal cues;Tactile cues  Cognition Arousal: Obtunded Behavior During Therapy: WFL for tasks assessed/performed Overall Cognitive Status: History of cognitive impairments - at baseline                                 General Comments: required extended time  to respond to questions but able to follow commands; cooperative with therapy        General Comments General comments (skin integrity, edema, etc.): sp02 was 95% on 4L following activity    Exercises     Assessment/Plan    PT Assessment Patient needs continued PT services  PT Problem List Decreased strength;Decreased mobility;Decreased activity tolerance;Decreased balance;Decreased cognition;Pain       PT Treatment Interventions DME instruction;Functional mobility training;Therapeutic activities    PT Goals (Current goals can be found in the Care Plan section)  Acute Rehab PT Goals Patient Stated Goal: unable to state PT Goal Formulation: With patient Time For Goal Achievement: 03/29/23 Potential to Achieve Goals: Poor    Frequency Min 1X/week     Co-evaluation PT/OT/SLP Co-Evaluation/Treatment: Yes Reason for Co-Treatment: For patient/therapist safety;To address functional/ADL transfers;Necessary to address cognition/behavior during functional activity PT goals addressed during session: Mobility/safety with mobility OT goals addressed during session: ADL's and self-care       AM-PAC PT "6 Clicks" Mobility  Outcome Measure Help needed turning from your back to your side while in a flat bed without using bedrails?: A Lot Help needed moving from lying on your back to sitting on the side of a flat bed without using bedrails?: A Lot Help needed moving to and from a bed to a chair (including a wheelchair)?: A Lot Help needed standing up from a chair using your arms (  e.g., wheelchair or bedside chair)?: A Lot Help needed to walk in hospital room?: Total Help needed climbing 3-5 steps with a railing? : Total 6 Click Score: 10    End of Session Equipment Utilized During Treatment: Oxygen Activity Tolerance: Patient limited by pain;Patient limited by fatigue Patient left: in bed;with call bell/phone within reach;with family/visitor present;Other (comment) (reapplied BUE  mittens) Nurse Communication: Mobility status PT Visit Diagnosis: Unsteadiness on feet (R26.81);Muscle weakness (generalized) (M62.81);History of falling (Z91.81);Repeated falls (R29.6);Pain Pain - Right/Left: Right Pain - part of body:  (neck)    Time: 6063-0160 PT Time Calculation (min) (ACUTE ONLY): 23 min   Charges:                 Elmon Else, SPT   Latroya Ng 03/15/2023, 4:03 PM

## 2023-03-15 NOTE — Progress Notes (Signed)
Occupational Therapy Treatment Patient Details Name: Aaron Gibbs MRN: [id number] DOB: 26-Mar-1935 Today's Date: 03/15/2023   History of present illness 88 y.o. 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 Suspected dx of sepsis, AMS.   OT comments  Pt is supine in bed on arrival with nurse and personal caregiver in the room. Pt had just received morphine and ativan per nurse report and had mittens on due to his agitation. Pt required increased time to arouse, but with encouragement was agreeable to PT/OT co eval/treat session. Pt did not report a number for pain, but seemed to be having increased neck pain d/t grimacing and hollering out stating "my neck hurts." He performed bed mobility for supine<>sit at EOB needing increased cueing for intiation of tasks d/t lethargy/cognition, but able to perform with Min A x2 (Mod A x1). Pt also able to lateral scoot towards [facility] with CGA x2 for safety. He performed X2 STS from EOB with HHA x2 requiring Mod A x2 then progressing to Min A x2 on second attempt with max standing tolerance of 30 seconds. Assisted pt for repositioning in bed for comfort and left with needs in place and caregiver in room. He will continue to require skilled acute OT services to maximize his safety and IND to return to PLOF.       If plan is discharge home, recommend the following:  A lot of help with bathing/dressing/bathroom;A lot of help with walking and/or transfers;Two people to help with walking and/or transfers;Help with stairs or ramp for entrance;Direct supervision/assist for medications management;Assistance with cooking/housework;Assist for  transportation   Equipment Recommendations  None recommended by OT    Recommendations for Other Services      Precautions / Restrictions Precautions Precautions: Fall Precaution Comments: blind Restrictions Weight Bearing Restrictions: No       Mobility Bed Mobility Overal bed mobility: Needs Assistance Bed Mobility: Supine to Sit, Sit to Supine     Supine to sit: HOB elevated, Min assist, Mod assist, +2 for physical assistance Sit to supine: Min assist, Mod assist, +2 for physical assistance   General bed mobility comments: pt needed increased cueing for intiation of tasks d/t lethargy/cognition, but able to perform bed mobility with Min A x2 (Mod A x1), lateral scooted at EOB with CGA for safety and needed assist for repositioning in bed    Transfers Overall transfer level: Needs assistance Equipment used: 2 person hand held assist Transfers: Sit to/from Stand, Bed to chair/wheelchair/BSC Sit to Stand: Min assist, Mod assist, +2 physical assistance          Lateral/Scoot Transfers: Contact guard assist, +2 physical assistance General transfer comment: First attempt of STS required mod A x2 but able to progress to min A x2 during second STS attempt; able to laterally scoot to EOB CGA x2 for safety     Balance Overall balance assessment: Needs assistance Sitting-balance support: Feet supported, Single extremity supported Sitting balance-Leahy Scale: Fair Sitting balance - Comments: able to maintain seated EOB balance during functional activities but requires CGA due to instances of falling asleep and posterior lean   Standing balance support: Bilateral upper extremity supported, During functional activity, Reliant on assistive device for balance   Standing balance comment: HHA x2 to reach  standing and maintain balance with less than 30 sec max tolerance d/t BLE weakness                           ADL either performed or assessed with clinical judgement    ADL                                         General ADL Comments: D/t pt's current lethargy and pain, likely to need total to Max A with most ADL tasks    Extremity/Trunk Assessment Upper Extremity Assessment Upper Extremity Assessment: Generalized weakness   Lower Extremity Assessment Lower Extremity Assessment: Generalized weakness        Vision       Perception     Praxis      Cognition Arousal: Obtunded Behavior During Therapy: WFL for tasks assessed/performed Overall Cognitive Status: History of cognitive impairments - at baseline                                 General Comments: required extended time to respond to questions but able to follow commands; cooperative with therapy        Exercises      Shoulder Instructions       General Comments sp02 was 95% on 4L following activity    Pertinent Vitals/ Pain       Pain Assessment Pain Assessment: Faces Faces Pain Scale: Hurts whole lot Pain Location: neck Pain Descriptors / Indicators: Grimacing, Discomfort Pain Intervention(s): Limited activity within patient's tolerance, Monitored during session, Repositioned  Home Living Family/patient expects to be discharged to:: Private residence Living Arrangements: Alone Available Help at Discharge: Personal care attendant;Available 24 hours/day Type of Home: House Home Access: Stairs to enter [employer] of Steps: 2 Entrance Stairs-Rails: Right;Left;Can reach both Home Layout: One level     Bathroom Shower/Tub: Producer, television/film/video: Standard     Home Equipment: BSC/3in1;Wheelchair - Therapist, sports   Additional Comments: caregiver verified information      Prior Functioning/Environment              Frequency  Min 1X/week        Progress Toward Goals  OT Goals(current goals can now be found in the care plan section)  Progress towards OT goals: Progressing toward  goals  Acute Rehab OT Goals Patient Stated Goal: return home on hospice with 24/7 care OT Goal Formulation: With patient/family Time For Goal Achievement: 03/28/23 Potential to Achieve Goals: Fair  Plan      Co-evaluation    PT/OT/SLP Co-Evaluation/Treatment: Yes Reason for Co-Treatment: For patient/therapist safety;To address functional/ADL transfers;Necessary to address cognition/behavior during functional activity PT goals addressed during session: Mobility/safety with mobility OT goals addressed during session: ADL's and self-care      AM-PAC OT "6 Clicks" Daily Activity     Outcome Measure   Help from another person eating meals?: None Help from another person taking care of personal grooming?: A Little Help from another person toileting, which includes using toliet, bedpan, or urinal?: A Lot Help from another person bathing (including washing, rinsing, drying)?: A Lot Help from another person to put on and taking off regular upper body clothing?: A Lot Help from another person to put on and taking off regular lower body  clothing?: A Lot 6 Click Score: 15    End of Session Equipment Utilized During Treatment: Rolling walker (2 wheels)  OT Visit Diagnosis: Other abnormalities of gait and mobility (R26.89);Muscle weakness (generalized) (M62.81)   Activity Tolerance Patient tolerated treatment well;Patient limited by fatigue;Patient limited by lethargy   Patient Left in bed;with call bell/phone within reach;with family/visitor present   Nurse Communication Mobility status        Time: 1191-4782 OT Time Calculation (min): 23 min  Charges: OT General Charges $OT Visit: 1 Visit OT Treatments $Therapeutic Activity: 8-22 mins  Karsten Vaughn, OTR/L  03/15/23, 4:08 PM   Serjio Deupree E Conner Neiss 03/15/2023, 4:03 PM

## 2023-03-15 NOTE — Plan of Care (Signed)
  Problem: Physical Regulation: Goal: Complications related to the disease process, condition or treatment will be avoided or minimized Outcome: Not Progressing   Problem: Clinical Measurements: Goal: Will remain free from infection Outcome: Not Progressing   Problem: Elimination: Goal: Will not experience complications related to bowel motility Outcome: Not Progressing   Problem: Pain Managment: Goal: General experience of comfort will improve Outcome: Not Progressing   Problem: Safety: Goal: Ability to remain free from injury will improve Outcome: Not Progressing

## 2023-03-15 NOTE — TOC Initial Note (Addendum)
Transition of Care [facility]) - Initial/Assessment Note    Patient Details  Name: Aaron Gibbs MRN: [id number] Date of Birth: 07/30/1934  Transition [facility]) CM/SW Contact:    Darolyn Rua, LCSW Phone Number: 03/15/2023, 2:15 PM  Clinical Narrative:                  Patient from home, active with [facility]. Redge Gainer with [facility] aware of admission, TOC will follow for discharge planning needs. Current plan to return home with [facility] at time of discharge   Update 4:07: EMS forms on chart , per MD will dc today  Expected Discharge Plan: Home w Hospice Care Barriers to Discharge: Continued Medical Work up   Patient Goals and CMS Choice   CMS Medicare.gov Compare Post Acute Care list provided to:: Patient Choice offered to / list presented to : Patient      Expected Discharge Plan and Services                                              Prior Living Arrangements/Services                       Activities of Daily Living   ADL Screening (condition at time of admission) Does the patient have a NEW difficulty with bathing/dressing/toileting/self-feeding that is expected to last >3 days?: No Does the patient have a NEW difficulty with getting in/out of bed, walking, or climbing stairs that is expected to last >3 days?: No Does the patient have a NEW difficulty with communication that is expected to last >3 days?: No Is the patient deaf or have difficulty hearing?: No Does the patient have difficulty seeing, even when wearing glasses/contacts?: No Does the patient have difficulty concentrating, remembering, or making decisions?: Yes  Permission Sought/Granted                  Emotional Assessment              Admission diagnosis:  Sepsis (HCC) [A41.9] Altered mental status, unspecified altered mental status type [R41.82] Patient Active Problem List   Diagnosis Date Noted   High risk medication use  03/14/2023   Sepsis (HCC) 03/13/2023   Cellulitis of left arm 03/13/2023   Shortness of breath 03/06/2023   Status post thoracentesis 03/06/2023   Acute respiratory failure with hypoxia (HCC) 03/05/2023   Pleural effusion 03/05/2023   VT (ventricular tachycardia) (HCC) 02/17/2023   Hypotension 02/17/2023   S/P PICC central line placement 02/17/2023   Ankylosing spondylitis lumbar region (HCC) 12/27/2022   Traumatic closed fracture of C2 vertebra with minimal displacement, sequela 12/27/2022   Cervicalgia 12/27/2022   Decreased functional activity tolerance 12/15/2022   Lymphedema of arm 12/15/2022   Melanoma in situ of trunk (HCC) 12/15/2022   COPD (chronic obstructive pulmonary disease) (HCC) 11/09/2022   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 (HCC) 11/09/2022   DNR (do not resuscitate) 10/17/2022   AKI (acute kidney injury) (HCC) 10/17/2022   C2 cervical fracture (HCC) 10/17/2022   Infrarenal abdominal aortic aneurysm (AAA) without rupture (HCC) 10/17/2022   Hypokalemia 10/17/2022   COVID-19 10/11/2022   Thrombocytopenia (HCC) 10/11/2022   Chronic heart failure with preserved ejection fraction (HCC) 09/13/2022   Palliative care patient 07/17/2022   Anemia 06/09/2022   Unable to  maintain body in lying position 05/24/2022   Benign paroxysmal positional vertigo    Generalized weakness 01/29/2022   Sensorineural hearing loss (SNHL) of both ears 07/25/2021   Pain in right knee 03/15/2021   Other chronic pain 03/15/2021   Chronic knee pain (Left) 09/28/2020   Abnormal MRI, knee (08/05/2018) (Left) 09/13/2020   Long-term current use of benzodiazepine 08/09/2020   Opioid dependence, binge pattern (HCC) 08/09/2020   Opioid dependence with or without physiological dependence (HCC) 08/08/2020   History of substance use disorder 04/15/2020   Osteoarthritis of knee (Left) 03/23/2020   Aortic atherosclerosis (HCC) 02/05/2020    Generalized anxiety disorder with panic attacks 02/04/2020   Depression with anxiety 12/13/2019   Depression 12/12/2019   HLD (hyperlipidemia)    Iron deficiency anemia    Fall at home, initial encounter    Medial meniscal tear, sequela (Left) 06/10/2019   Patellar tendinosis (Right) 06/10/2019   Lateral meniscal tear, sequela (Left) 03/03/2019   Palpitation 02/13/2019   Preop testing 11/14/2018   Noncompliance with medication treatment due to overuse of medication 10/23/2018   Neurogenic pain 08/20/2018   Atherosclerotic peripheral vascular disease (HCC) 07/24/2018   Tricompartment osteoarthritis of knee (Left) 07/24/2018   Osteoarthritis of knee (Bilateral) 07/24/2018   Osteoarthritis of patellofemoral joint (Right) 07/24/2018   History of suicide attempt (06/12/18) 07/24/2018   Long term current use of opiate analgesic 07/10/2018   Pharmacologic therapy 07/10/2018   Disorder of skeletal system 07/10/2018   Problems influencing health status 07/10/2018   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   Major depressive disorder, recurrent episode, severe (HCC) 06/04/2018   Blindness 05/10/2018   Chronic pain syndrome 05/01/2018   DISH (diffuse idiopathic skeletal hyperostosis) 05/01/2018   Osteoarthritis of multiple joints 05/01/2018   Chronic knee pain (1ry area of Pain) (Bilateral) (L>R) 05/01/2018   Retinitis pigmentosa of both eyes 05/01/2018   Glaucoma of both eyes 05/01/2018   Essential hypertension 05/01/2018   Chronic low back pain (Bilateral) w/o sciatica 05/01/2018   GERD (gastroesophageal reflux disease) 05/01/2018   AVM (arteriovenous malformation) of colon 05/01/2018   Therapeutic opioid-induced constipation (OIC) 05/01/2018   PCP:  Smitty Cords, DO Pharmacy:   [id number] - [place], [place] - [address] AT [facility] OF [address] [place] [address] Phone: [phone] Fax: [phone]     Social Determinants of  Health (SDOH) Social History: SDOH Screenings   Food Insecurity: Patient Unable To Answer (03/13/2023)  Housing: Patient Unable To Answer (03/13/2023)  Transportation Needs: Patient Unable To Answer (03/13/2023)  Utilities: Patient Unable To Answer (03/13/2023)  Alcohol Screen: Low Risk  (02/02/2023)  Depression (PHQ2-9): Low Risk  (02/02/2023)  Recent Concern: Depression (PHQ2-9) - Medium Risk (12/27/2022)  Financial Resource Strain: Low Risk  (07/13/2022)  Physical Activity: Inactive (07/13/2022)  Social Connections: Socially Isolated (07/13/2022)  Stress: Stress Concern Present (07/13/2022)  Tobacco Use: Medium Risk (03/13/2023)   SDOH Interventions:     Readmission Risk Interventions    02/20/2023    9:47 AM  Readmission Risk Prevention Plan  Transportation Screening Complete  Medication Review (RN Care Manager) Referral to Pharmacy  PCP or Specialist appointment within 3-5 days of discharge Complete  HRI or Home Care Consult Complete  SW Recovery Care/Counseling Consult Complete  Palliative Care Screening Not Applicable  Skilled Nursing Facility Not Applicable

## 2023-03-16 ENCOUNTER — Other Ambulatory Visit: Payer: Self-pay

## 2023-03-16 ENCOUNTER — Telehealth: Payer: Self-pay

## 2023-03-16 LAB — CYTOLOGY - NON PAP

## 2023-03-16 NOTE — Transitions of Care (Post Inpatient/ED Visit) (Signed)
03/16/2023  Name: Aaron Gibbs MRN: [id number] DOB: 05-Dec-1934  Today's TOC FU Call Status: Today's TOC FU Call Status:: Successful TOC FU Call Completed TOC FU Call Complete Date: 03/16/23 Patient's Name and Date of Birth confirmed.  Transition Care Management Follow-up Telephone Call Date of Discharge: 03/15/23 Discharge Facility: [facility]) Type of Discharge: Inpatient Admission Primary Inpatient Discharge Diagnosis:: "AMS,unspecified AMS type" How have you been since you were released from the hospital?: Worse (Spoke w/ CG-Candice-states pt 's conditon continues to 'get worse-pt w/ AMS-being transferred to [facility] and they are in route to pick him up." Support given to CG.)        Antionette Fairy, RN,BSN,CCM [facility] Care Management Care Management Community Coordinator Direct Phone: [phone] Toll Free: [phone] Fax: [phone]

## 2023-03-18 LAB — CULTURE, BLOOD (ROUTINE X 2): Culture: NO GROWTH

## 2023-03-23 ENCOUNTER — Other Ambulatory Visit: Payer: Self-pay

## 2023-03-23 DIAGNOSIS — S12100A Unspecified displaced fracture of second cervical vertebra, initial encounter for closed fracture: Secondary | ICD-10-CM

## 2023-03-26 ENCOUNTER — Ambulatory Visit: Payer: Medicare Other | Admitting: Neurosurgery

## 2023-03-30 LAB — MOLECULAR PATHOLOGY

## 2023-04-02 ENCOUNTER — Ambulatory Visit: Payer: Medicare Other | Admitting: Family Medicine

## 2023-04-03 LAB — SURGICAL PATHOLOGY

## 2023-04-20 DEATH — deceased

## 2023-04-28 ENCOUNTER — Other Ambulatory Visit: Payer: Self-pay | Admitting: Family Medicine

## 2023-04-28 DIAGNOSIS — I10 Essential (primary) hypertension: Secondary | ICD-10-CM

## 2023-05-10 ENCOUNTER — Ambulatory Visit: Payer: Medicare Other | Admitting: Cardiology
